# Patient Record
Sex: Female | Born: 1945 | Race: White | Hispanic: No | Smoking: Never smoker
Health system: Southern US, Community
[De-identification: ages and names within clinical notes are randomized; demographics above are authoritative.]

## PROBLEM LIST (undated history)

## (undated) DIAGNOSIS — Z9889 Other specified postprocedural states: Secondary | ICD-10-CM

## (undated) DIAGNOSIS — E119 Type 2 diabetes mellitus without complications: Secondary | ICD-10-CM

## (undated) DIAGNOSIS — H269 Unspecified cataract: Secondary | ICD-10-CM

## (undated) DIAGNOSIS — S82899A Other fracture of unspecified lower leg, initial encounter for closed fracture: Secondary | ICD-10-CM

## (undated) DIAGNOSIS — M1990 Unspecified osteoarthritis, unspecified site: Secondary | ICD-10-CM

## (undated) DIAGNOSIS — T7840XA Allergy, unspecified, initial encounter: Secondary | ICD-10-CM

## (undated) DIAGNOSIS — I10 Essential (primary) hypertension: Secondary | ICD-10-CM

## (undated) DIAGNOSIS — N184 Chronic kidney disease, stage 4 (severe): Secondary | ICD-10-CM

## (undated) DIAGNOSIS — H3530 Unspecified macular degeneration: Secondary | ICD-10-CM

## (undated) HISTORY — DX: Unspecified osteoarthritis, unspecified site: M19.90

## (undated) HISTORY — PX: EYE SURGERY: SHX253

## (undated) HISTORY — DX: Allergy, unspecified, initial encounter: T78.40XA

## (undated) HISTORY — DX: Type 2 diabetes mellitus without complications: E11.9

## (undated) HISTORY — DX: Other specified postprocedural states: Z98.890

## (undated) HISTORY — DX: Other fracture of unspecified lower leg, initial encounter for closed fracture: S82.899A

## (undated) HISTORY — DX: Unspecified cataract: H26.9

---

## 2013-04-13 ENCOUNTER — Encounter: Payer: Self-pay | Admitting: Podiatrist

## 2013-04-13 ENCOUNTER — Ambulatory Visit (INDEPENDENT_AMBULATORY_CARE_PROVIDER_SITE_OTHER): Payer: Medicare Other | Admitting: Podiatrist

## 2013-04-13 VITALS — BP 124/96 | HR 98 | Resp 16 | Ht 62.0 in | Wt 151.0 lb

## 2013-04-13 DIAGNOSIS — L97509 Non-pressure chronic ulcer of other part of unspecified foot with unspecified severity: Secondary | ICD-10-CM

## 2013-04-13 NOTE — Progress Notes (Addendum)
Subjective:  Patient presents today for continued care of ulceration of left foot.  Patient denies any new complaints.  Denies nausea, vomiting, fevers or chills.  States ulceration feels and appears much better- she is hoping it is healed.  Objective:  Ulceration located submetatarsal 1 of Left foot.  It has fragile, friable skin but no breakdown of integument noted. No redness, streaking or lymphingitis noted.  Prominent plantarflexed 1st metatarsal is noted with hyperkeratotic pre-ulcerative tissue noted.  Neuropathy present with pedal pulese intact  Assessment:  Ulceration Left foot submet 1  Plan: Discussed etiology, pathology, conservative vs. Surgical therapies and at this time office debridement was recommended  Hyperkeratotic tissue was debrided and intact integument noted.  Patient given instructions on aftercare.  Will return in 4 week intervals for preventative care of hpk  Trudie Buckler, DPM

## 2013-04-13 NOTE — Patient Instructions (Signed)
Your ulcer is almost healed!  Continue to watch the area and pad it until the skin becomes less fragile.  You no longer need to apply any antibiotic creams unless you see redness, swelling, pus or drainage.  If you do notice any of these symptoms, please call to be seen!

## 2013-05-25 ENCOUNTER — Encounter: Payer: Self-pay | Admitting: Podiatrist

## 2013-05-25 ENCOUNTER — Ambulatory Visit (INDEPENDENT_AMBULATORY_CARE_PROVIDER_SITE_OTHER): Payer: Medicare Other | Admitting: Podiatrist

## 2013-05-25 ENCOUNTER — Encounter (INDEPENDENT_AMBULATORY_CARE_PROVIDER_SITE_OTHER): Payer: Self-pay

## 2013-05-25 VITALS — BP 134/89 | HR 100 | Resp 18

## 2013-05-25 DIAGNOSIS — M79609 Pain in unspecified limb: Secondary | ICD-10-CM

## 2013-05-25 DIAGNOSIS — L97509 Non-pressure chronic ulcer of other part of unspecified foot with unspecified severity: Secondary | ICD-10-CM

## 2013-05-25 DIAGNOSIS — B351 Tinea unguium: Secondary | ICD-10-CM

## 2013-05-25 MED ORDER — CEPHALEXIN 500 MG PO CAPS: 500.0000 mg | ORAL_CAPSULE | Freq: Three times a day (TID) | ORAL | Status: DC

## 2013-05-25 NOTE — Progress Notes (Signed)
  Subjective: Patient presents today for continued care of ulceration of submetatarsal 1 left foot. Patient denies any new complaints. Denies nausea, vomiting, fevers or chills. She is getting new shoes today as she noticed the skin is thick again where the ulceration was present.  Objective: Ulceration located submetatarsal 1 of the Leftt foot. Poro keratotic tissue overlying and a largecallus present. No pus, streaking or lymphingitis noted however there is some redness around the ulceration itself..  Neuropathy present with pedal pulese intact.   toenails are also elongated thickened and patient complains of pain Assessment: Ulceration left foot submet 1,  painful symptomatic toenails Plan:  Discussed etiology, pathology, conservative vs. Surgical therapies and at this time office debridement was recommended Hyperkeratotic tissue was debrided and ulceration was again noted. Toenails were also debrided without complications. Patient given instructions on aftercare. Will return in 2 weeks for ulcer check. Also called in Keflex for her to start taking today as well. Trudie Buckler, DPM

## 2013-05-25 NOTE — Patient Instructions (Signed)
Instructions for Wound Care  The most important step to healing a foot wound is to reduce the pressure on your foot - it is extremely important to stay off your foot as much as possible   Cleanse your foot with saline wash or warm soapy water (dial antibacterial soap or similar).  Blot dry.  Apply prescribed medication to your wound and cover with gauze and a bandage.  May hold bandage in place with Coban (self sticky wrap), Ace bandage or tape.  You may find dressing supplies at your local [employer], [employer], drug store or medical supply store.  Your prescribed topical medication is :  Silvadene Cream (twice daily)   If you notice any foul odor, increase in pain, pus, increased swelling, red streaks or generalized redness occurring in your foot or leg-Call our office immediately to be seen.  This may be a sign of a limb or life threatening infection that will need prompt attention.  Trudie Buckler, [place]

## 2013-06-08 ENCOUNTER — Ambulatory Visit (INDEPENDENT_AMBULATORY_CARE_PROVIDER_SITE_OTHER): Payer: Medicare Other | Admitting: Podiatrist

## 2013-06-08 ENCOUNTER — Encounter: Payer: Self-pay | Admitting: Podiatrist

## 2013-06-08 DIAGNOSIS — L97509 Non-pressure chronic ulcer of other part of unspecified foot with unspecified severity: Secondary | ICD-10-CM

## 2013-06-08 NOTE — Progress Notes (Signed)
Subjective: Patient presents today for continued care of ulceration of submetatarsal 1 left foot. She states it "Feels much better on my left foot and I have been soaking it in epsom salt and went to a salt cave in [place] this past weekend".  She finished her Keflex antibiotics as prescribed.   Objective: Ulceration located submetatarsal 1 of the Leftt foot evaluated.  Hyperkeratotic tissue is present with a friable intact integument present beneath.  No pus, streaking or lymphingitis noted  Neuropathy present with pedal pulses intact.  Assessment: Ulceration left foot submet 1 in the presence of Neuropathy  Plan:  Discussed etiology, pathology, conservative vs. Surgical therapies and at this time office debridement was recommended Hyperkeratotic tissue was debrided.  Patient given instructions on aftercare. Will return in 4 weeks for recheck. If she notices any redness, swelling, or drainage, or any signs or symptoms of infection she is to call  Trudie Buckler, DPM

## 2013-06-08 NOTE — Patient Instructions (Signed)
Soak in epsom salts for 1 more week.  Keep a dressing on your foot for about 1-2 more weeks as well.  Call if you notice any redness, swelling, or drainage.

## 2013-07-20 ENCOUNTER — Encounter: Payer: Self-pay | Admitting: Podiatrist

## 2013-07-20 ENCOUNTER — Ambulatory Visit (INDEPENDENT_AMBULATORY_CARE_PROVIDER_SITE_OTHER): Payer: Medicare Other | Admitting: Podiatrist

## 2013-07-20 VITALS — BP 162/93 | HR 81 | Resp 17 | Ht 61.0 in | Wt 145.0 lb

## 2013-07-20 DIAGNOSIS — L97509 Non-pressure chronic ulcer of other part of unspecified foot with unspecified severity: Secondary | ICD-10-CM

## 2013-07-20 NOTE — Progress Notes (Signed)
Pt states she had the shingles over the Christmas holidays, but the doctor told me I'm not contagious.  Subjective: Patient presents today for continued care of ulceration of submetatarsal 1 left foot. She states it "Feels much better on my left foot" her nails are also long and uncomfortable Objective: Ulceration located submetatarsal 1 of the Leftt foot evaluated. Hyperkeratotic tissue is present with a friable intact integument present beneath. No pus, streaking or lymphingitis noted.  Toenails are elongated and uncomfortable as well Neuropathy present with pedal pulses intact.  Assessment: Ulceration left foot submet 1 in the presence of Neuropathy , onychomycosis Plan:  Discussed etiology, pathology, conservative vs. Surgical therapies and at this time office debridement was recommended Hyperkeratotic tissue was debrided. Patient given instructions on aftercare. Will return in 4 weeks for recheck. If she notices any redness, swelling, or drainage, or any signs or symptoms of infection she is to call . Nail debridement also accomplished today withouth complication Trudie Buckler, DPM

## 2013-08-11 ENCOUNTER — Telehealth: Payer: Self-pay | Admitting: *Deleted

## 2013-08-13 ENCOUNTER — Other Ambulatory Visit: Payer: Self-pay | Admitting: Podiatrist

## 2013-08-13 MED ORDER — CEPHALEXIN 500 MG PO CAPS: 500.0000 mg | ORAL_CAPSULE | Freq: Four times a day (QID) | ORAL | Status: DC

## 2013-08-13 NOTE — Telephone Encounter (Signed)
DR Valentina Lucks STATED TODAY THAT SHE HAD TAKEN CARE OF THIS 3 DAYS AGO/LC

## 2013-08-17 ENCOUNTER — Encounter: Payer: Self-pay | Admitting: Podiatrist

## 2013-08-17 ENCOUNTER — Ambulatory Visit (INDEPENDENT_AMBULATORY_CARE_PROVIDER_SITE_OTHER): Payer: Medicare Other | Admitting: Podiatrist

## 2013-08-17 VITALS — BP 157/92 | HR 93 | Resp 18

## 2013-08-17 DIAGNOSIS — L97509 Non-pressure chronic ulcer of other part of unspecified foot with unspecified severity: Secondary | ICD-10-CM

## 2013-08-17 DIAGNOSIS — L600 Ingrowing nail: Secondary | ICD-10-CM

## 2013-08-17 MED ORDER — CEPHALEXIN 500 MG PO CAPS: 500.0000 mg | ORAL_CAPSULE | Freq: Four times a day (QID) | ORAL | Status: DC

## 2013-08-17 NOTE — Progress Notes (Signed)
Subjective:  [name] presents for follow up of callus that has a tendency to ulcerate on the bottom of her left foot.  She states "The callus on ball of the left foot needs to be trimmed and left big toenail had a piece of skin and I pulled it and it is red and draining and I used coconut oil and been going on for about a week"  She states the left big toenail is red and swollen and she is concerned over infection.  Objective:  Vascular exam reveals pedal pulses intact at 2/4 dp and 1/4 pt bilateral.  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  Her callus appears friable and once debrided reveals macerated tissue beneath the callus and a small ulceration measuring 96mm in diameter.  No probing to bone is present.  The hallux on the left foot is red where she pulled a piece of toenail or skin off and caused the toe to be ingrown.  It does not have an ingrown spicule present at this time.  No streaking, no lymphangitis is present however cellulitus within the toe appears present  Assessment:  Ulcer submet 1 left foot- chronic ,  Paronychia left hallux  Plan: Discussed etiology, pathology, conservative vs. Surgical therapies and at this time office debridement was recommended  Ulcer was debrided and reactive hyperkeratoses and necrotic tissue was resected to the level of bleeding or viable tissue. No deep abscess, no erythema, no edema, no cellulitis, no odor was encountered.  Antibiotic ointment and a sterile dressing was applied.  Patient was given instructions on offloading and dressing change/aftercare and was instructed to call immediately if any signs or symptoms of infection arise.  She will be seen back in 1 week for recheck.  iodosorb will be ordered for the patient through [facility].  Keflex was prescribed for her to take immediately.  She states she has another presecrption that she never filled for the keflex and she will take this instead.

## 2013-09-21 ENCOUNTER — Encounter: Payer: Self-pay | Admitting: Podiatrist

## 2013-09-21 ENCOUNTER — Ambulatory Visit (INDEPENDENT_AMBULATORY_CARE_PROVIDER_SITE_OTHER): Payer: Medicare Other | Admitting: Podiatrist

## 2013-09-21 VITALS — BP 162/88 | HR 88 | Resp 18

## 2013-09-21 DIAGNOSIS — B351 Tinea unguium: Secondary | ICD-10-CM

## 2013-09-21 DIAGNOSIS — L97509 Non-pressure chronic ulcer of other part of unspecified foot with unspecified severity: Secondary | ICD-10-CM

## 2013-09-21 DIAGNOSIS — M79609 Pain in unspecified limb: Secondary | ICD-10-CM

## 2013-09-21 NOTE — Progress Notes (Signed)
   Subjective: [name] presents for follow up of callus that has a tendency to ulcerate on the bottom of her left foot. She states "I have this place on the ball of my left foot and trim my nails".  She relates the callus/ulcer has been present for over 6 months.  We have tried various topical antibiotic therapies, offloading, and the area continues to ulcerate.   Objective: Vascular exam reveals pedal pulses intact at 2/4 dp and 1/4 pt bilateral. Neurological sensation decreased with peripheral neuropathy present bilateral feet. Her callus appears friable and once debrided reveals macerated tissue beneath the callus and a small fissure ulceration measuring 30mm x 50mm . No probing to bone is present. As or signs of infection are present. Patient's toenails are elongated, thickened, discolored, dystrophic, clinically mycotic and painful. Left hallux where an ingrown toenail was performed has hyperkeratotic tissue overlying  Assessment: Ulcer submet 1 left foot- chronic , symptomatic onychomycosis   Plan:  Discussed etiology, pathology, conservative vs. Surgical therapies and at this time office debridement was recommended Ulcer was debrided and reactive hyperkeratoses and necrotic tissue was resected to the level of bleeding or viable tissue. No deep abscess, no erythema, no edema, no cellulitis, no odor was encountered. Antibiotic ointment and a sterile dressing was applied. Patient was given instructions on offloading and dressing change/aftercare and was instructed to call immediately if any signs or symptoms of infection arise. Discussed sending her to the wound center in [facility] for further treatment of the ulcer and possible skin graft.  She will consider the therapy and let me know if she would like to proceed. Debrided the toenails without complication at today's visit as well.

## 2013-10-19 ENCOUNTER — Encounter: Payer: Self-pay | Admitting: Podiatrist

## 2013-10-19 ENCOUNTER — Ambulatory Visit (INDEPENDENT_AMBULATORY_CARE_PROVIDER_SITE_OTHER): Payer: Medicare Other | Admitting: Podiatrist

## 2013-10-19 VITALS — BP 135/77 | HR 91 | Resp 18

## 2013-10-19 DIAGNOSIS — L97509 Non-pressure chronic ulcer of other part of unspecified foot with unspecified severity: Secondary | ICD-10-CM

## 2013-10-19 NOTE — Progress Notes (Signed)
I think it is better on my left foot  Subjective: Doreene presents for follow up of callus that has a tendency to ulcerate on the bottom of her left foot. She states "I think it is better- I have been using the iodosorb". She relates the callus/ulcer has been present for over 6 months. The iodosorb seems to be helping with the ulceration  Objective: Vascular exam reveals pedal pulses intact at 2/4 dp and 1/4 pt bilateral. Neurological sensation decreased with peripheral neuropathy present bilateral feet. Her callus appears friable and once debrided reveals  Continued macerated tissue beneath the callus and a small fissure ulceration measuring 43mm x 17mm . No probing to bone is present. No signs of infection are present.   Assessment: Ulcer submet 1 left foot- chronic   Plan:  Discussed etiology, pathology, conservative vs. Surgical therapies and at this time office debridement was recommended Ulcer was debrided and reactive hyperkeratoses and necrotic tissue was resected to the level of bleeding or viable tissue. No deep abscess, no erythema, no edema, no cellulitis, no odor was encountered. Iodosorb and a sterile dressing was applied. Patient was given instructions on offloading and dressing change/aftercare with Iodosorb and was instructed to call immediately if any signs or symptoms of infection arise. IF not improved next visit will consider a [place]askin Graft.

## 2013-11-16 ENCOUNTER — Ambulatory Visit (INDEPENDENT_AMBULATORY_CARE_PROVIDER_SITE_OTHER): Payer: Medicare Other | Admitting: Podiatrist

## 2013-11-16 ENCOUNTER — Encounter: Payer: Self-pay | Admitting: Podiatrist

## 2013-11-16 VITALS — BP 178/89 | HR 85 | Temp 97.1°F | Resp 18

## 2013-11-16 DIAGNOSIS — L97509 Non-pressure chronic ulcer of other part of unspecified foot with unspecified severity: Secondary | ICD-10-CM

## 2013-11-16 DIAGNOSIS — M79609 Pain in unspecified limb: Secondary | ICD-10-CM

## 2013-11-16 DIAGNOSIS — B351 Tinea unguium: Secondary | ICD-10-CM

## 2013-11-16 NOTE — Progress Notes (Signed)
   Subjective:    Patient ID: [name], female    DOB: February 22, 1946, 68 y.o.   MRN: [id number]  HPI" I have a new spot on the ball of the left foot and I went and got a new pair of shoes in April and wore them about 4 times and that is how the spot got there and used neosporin and I need a trim"  [name] presents for follow up of chronic ulcer submet 1 of the left foot.  She has a new lesion submet 5 due to a new pair of shoes she wore in April.  She denies any systemic or local signs of infection.  She denies pain.  She does have neuropathy bilaterally.   Review of Systems     Objective:   Physical Exam Objective: Vascular exam reveals pedal pulses intact at 2/4 dp and 1/4 pt bilateral. Neurological sensation decreased with peripheral neuropathy present bilateral feet. submet 1 left reveals Continued macerated tissue beneath the callus and a small ulceration measuring  43mm x 51mm . No probing to bone is present. No signs of infection are present.  Hemorrhagic tissue present submet 5 as well.  Measuring 11mm x 108mm x 67mm.  No signs of infection noted.  All nails are thick, discolored, elongated and mycotic.     Assessment & Plan:  Ulcer 1 and 5-- mycotic toenails  Plan:  Debrided the mycotic toensils,  Debrided the ulcerations, applied iodosorb and a dressing.  Gave instructions for home care of the wounds.  Recommended referral to the wound center for further therapy.  We will send out a referral to the [place] [facility].

## 2013-11-23 ENCOUNTER — Ambulatory Visit: Payer: Medicare Other | Admitting: Podiatrist

## 2013-11-25 ENCOUNTER — Encounter: Payer: Self-pay | Admitting: Surgery

## 2013-11-25 ENCOUNTER — Ambulatory Visit: Payer: Self-pay | Admitting: Surgery

## 2013-11-25 LAB — HEMOGLOBIN A1C: HEMOGLOBIN A1C: 10.3 % — AB (ref 4.2–6.3)

## 2013-12-06 ENCOUNTER — Encounter: Payer: Self-pay | Admitting: Surgery

## 2014-02-15 ENCOUNTER — Ambulatory Visit: Payer: Medicare Other | Admitting: Podiatrist

## 2014-10-27 ENCOUNTER — Ambulatory Visit: Payer: BC Managed Care – PPO | Admitting: Podiatrist

## 2014-10-28 ENCOUNTER — Ambulatory Visit: Payer: BC Managed Care – PPO

## 2015-07-09 HISTORY — PX: OTHER SURGICAL HISTORY: SHX169

## 2016-04-15 ENCOUNTER — Ambulatory Visit (INDEPENDENT_AMBULATORY_CARE_PROVIDER_SITE_OTHER): Payer: Medicare Other | Admitting: Podiatry

## 2016-04-15 ENCOUNTER — Encounter: Payer: Self-pay | Admitting: Podiatry

## 2016-04-15 VITALS — BP 188/94 | HR 89 | Resp 14

## 2016-04-15 DIAGNOSIS — L03119 Cellulitis of unspecified part of limb: Secondary | ICD-10-CM

## 2016-04-15 DIAGNOSIS — L02619 Cutaneous abscess of unspecified foot: Secondary | ICD-10-CM | POA: Diagnosis not present

## 2016-04-15 DIAGNOSIS — E08621 Diabetes mellitus due to underlying condition with foot ulcer: Secondary | ICD-10-CM

## 2016-04-15 DIAGNOSIS — L97521 Non-pressure chronic ulcer of other part of left foot limited to breakdown of skin: Principal | ICD-10-CM

## 2016-04-15 DIAGNOSIS — L89891 Pressure ulcer of other site, stage 1: Secondary | ICD-10-CM

## 2016-04-15 MED ORDER — CEPHALEXIN 500 MG PO CAPS: 500.0000 mg | ORAL_CAPSULE | Freq: Two times a day (BID) | ORAL | 0 refills | Status: DC

## 2016-04-15 NOTE — Progress Notes (Signed)
This patient presents to the office with chief complaint of a developing ulcer under the ball of her left foot. She states that it is not painful and there is been no drainage or bleeding noted from the ulcer. She says isn't present for 3 months and she has been soaking it in Listerine and vinegar at home. She presents the office today stating that she has this ulcer and she is diabetic. She know she  needs to have this wound closed. She presents the office for an evaluation and treatment of this condition. She has a history of a ulcer, sub-1 of the left foot treated by Dr. Valentina Lucks 2 years ago   GENERAL APPEARANCE: Alert, conversant. Appropriately groomed. No acute distress.  VASCULAR: Pedal pulses are  palpable at  [facility] and PT bilateral.  Capillary refill time is immediate to all digits,  Normal temperature gradient.   NEUROLOGIC: sensation is normal to 5.07 monofilament at 5/5 sites bilateral.  Light touch is intact bilateral, Muscle strength normal.  MUSCULOSKELETAL: acceptable muscle strength, tone and stability bilateral.  Intrinsic muscluature intact bilateral.  Rectus appearance of foot and digits noted bilateral.   DERMATOLOGIC: She has an ulcer, sub-first MPJ of the left foot, which measures 10 mm x 10 mm no evidence of any pus or drainage from the ulcer. The ulcer is surrounded by a circle  of callus. No drainage noted. No malodor is noted. The beginnings of redness is noted around the ulcer on the left foot   Diabetic ulcer with possible infection.  IE  debridement of the necrotic tissue, sub-first MPJ left foot. Silvadene dry sterile dressing was applied. Home instructions were given for soaks cephalexin was prescribed for this patient. She is to return the office in one week for further evaluation and treatment   Gardiner Barefoot DPM

## 2016-04-22 ENCOUNTER — Ambulatory Visit (INDEPENDENT_AMBULATORY_CARE_PROVIDER_SITE_OTHER): Payer: Medicare Other | Admitting: Podiatry

## 2016-04-22 ENCOUNTER — Encounter: Payer: Self-pay | Admitting: Podiatry

## 2016-04-22 VITALS — BP 179/90 | HR 83 | Resp 14

## 2016-04-22 DIAGNOSIS — L02619 Cutaneous abscess of unspecified foot: Secondary | ICD-10-CM

## 2016-04-22 DIAGNOSIS — L03119 Cellulitis of unspecified part of limb: Secondary | ICD-10-CM

## 2016-04-22 DIAGNOSIS — L97521 Non-pressure chronic ulcer of other part of left foot limited to breakdown of skin: Principal | ICD-10-CM

## 2016-04-22 DIAGNOSIS — L89891 Pressure ulcer of other site, stage 1: Secondary | ICD-10-CM | POA: Diagnosis not present

## 2016-04-22 DIAGNOSIS — E08621 Diabetes mellitus due to underlying condition with foot ulcer: Secondary | ICD-10-CM

## 2016-04-22 NOTE — Progress Notes (Signed)
This patient presents to the office with chief complaint of a ulcer under the big toe joint, left foot. She was diagnosed with this ulcer last week and treated with home soaks and bandaging. He states that the ulcer looks much better today and she is very pleased with her progress. She presents the office today for continued evaluation and treatment of this condition   GENERAL APPEARANCE: Alert, conversant. Appropriately groomed. No acute distress.  VASCULAR: Pedal pulses are  palpable at  [facility] and PT bilateral.  Capillary refill time is immediate to all digits,  Normal temperature gradient.   NEUROLOGIC: sensation is normal to 5.07 monofilament at 5/5 sites bilateral.  Light touch is intact bilateral, Muscle strength normal.  MUSCULOSKELETAL: acceptable muscle strength, tone and stability bilateral.  Intrinsic muscluature intact bilateral.  Rectus appearance of foot and digits noted bilateral.   DERMATOLOGIC: She has an ulcer, sub-first MPJ of the left foot, which measures 6 mm x 10 mm no evidence of any pus or drainage from the ulcer. The ulcer is surrounded by a circle  of callus. No drainage noted. No malodor is noted. No redness is noted.   Diabetic ulcer  IE  debridement of the necrotic tissue, sub-first MPJ left foot. Silvadene dry sterile dressing was applied. To continue with home soaks.   She is to return the office in two weeks for further evaluation and treatment   Gardiner Barefoot DPM

## 2016-05-13 ENCOUNTER — Ambulatory Visit (INDEPENDENT_AMBULATORY_CARE_PROVIDER_SITE_OTHER): Payer: Medicare Other | Admitting: Podiatry

## 2016-05-13 ENCOUNTER — Ambulatory Visit: Payer: Self-pay | Admitting: Physical Therapy

## 2016-05-13 ENCOUNTER — Encounter: Payer: Self-pay | Admitting: Podiatry

## 2016-05-13 VITALS — BP 200/100 | HR 87 | Resp 14

## 2016-05-13 DIAGNOSIS — L97521 Non-pressure chronic ulcer of other part of left foot limited to breakdown of skin: Secondary | ICD-10-CM

## 2016-05-13 DIAGNOSIS — E08621 Diabetes mellitus due to underlying condition with foot ulcer: Secondary | ICD-10-CM

## 2016-05-13 MED ORDER — SILVER SULFADIAZINE 1 % EX CREA: TOPICAL_CREAM | Freq: Every day | CUTANEOUS | 0 refills | Status: AC

## 2016-05-13 MED ORDER — CEPHALEXIN 500 MG PO CAPS: 500.0000 mg | ORAL_CAPSULE | Freq: Two times a day (BID) | ORAL | 0 refills | Status: DC

## 2016-05-13 NOTE — Addendum Note (Signed)
Addended byDeidre Ala, Enrrique Mierzwa L on: 05/13/2016 02:37 PM   Modules accepted: Orders

## 2016-05-13 NOTE — Progress Notes (Signed)
This patient presents to the office with chief complaint of a ulcer under the big toe joint, left foot. She was diagnosed with this ulcer last week and treated with home soaks and bandaging. She  states that the ulcer looks much better today and she is very pleased with her progress. She presents the office today for continued evaluation and treatment of this condition   GENERAL APPEARANCE: Alert, conversant. Appropriately groomed. No acute distress.  VASCULAR: Pedal pulses are  palpable at  [facility] and PT bilateral.  Capillary refill time is immediate to all digits,  Normal temperature gradient.   NEUROLOGIC: sensation is normal to 5.07 monofilament at 5/5 sites bilateral.  Light touch is intact bilateral, Muscle strength normal.  MUSCULOSKELETAL: acceptable muscle strength, tone and stability bilateral.  Intrinsic muscluature intact bilateral.  Rectus appearance of foot and digits noted bilateral.   DERMATOLOGIC: She has an ulcer, sub-first MPJ of the left foot, which measures 14  mm x 10 mm no evidence of any pus or drainage from the ulcer. The ulcer is surrounded by a circle  of callus. No drainage noted. No malodor is noted. No redness is noted.   Diabetic ulcer  IE  debridement of the necrotic tissue, sub-first MPJ left foot. Silvadene dry sterile dressing was applied. To continue with home soaks.   She is to return the office in two weeks for further evaluation and treatment.  Prescribed antibiotics at this visit, as well as Silvadene cream.  Return to the clinic in 2 weeks  Gardiner Barefoot DPM

## 2016-05-27 ENCOUNTER — Ambulatory Visit (INDEPENDENT_AMBULATORY_CARE_PROVIDER_SITE_OTHER): Payer: Medicare Other | Admitting: Podiatry

## 2016-05-27 ENCOUNTER — Encounter: Payer: Self-pay | Admitting: Podiatry

## 2016-05-27 VITALS — BP 191/107 | HR 96 | Resp 14

## 2016-05-27 DIAGNOSIS — L97521 Non-pressure chronic ulcer of other part of left foot limited to breakdown of skin: Secondary | ICD-10-CM | POA: Diagnosis not present

## 2016-05-27 DIAGNOSIS — E08621 Diabetes mellitus due to underlying condition with foot ulcer: Secondary | ICD-10-CM

## 2016-05-27 NOTE — Progress Notes (Signed)
This patient presents to the office with chief complaint of a ulcer under the big toe joint, left foot. She was diagnosed with this ulcer last week and treated with home soaks and bandaging. She  states that the ulcer looks much better today  She says there is little drainage but the ulcer does not appear to be closing.. She presents the office today for continued evaluation and treatment of this condition   GENERAL APPEARANCE: Alert, conversant. Appropriately groomed. No acute distress.  VASCULAR: Pedal pulses are  palpable at  [facility] and PT bilateral.  Capillary refill time is immediate to all digits,  Normal temperature gradient.   NEUROLOGIC: sensation is normal to 5.07 monofilament at 5/5 sites bilateral.  Light touch is intact bilateral, Muscle strength normal.  MUSCULOSKELETAL: acceptable muscle strength, tone and stability bilateral.  Intrinsic muscluature intact bilateral.  Rectus appearance of foot and digits noted bilateral.   DERMATOLOGIC: She has an ulcer, sub-first MPJ of the left foot, which measures 14  mm x 10 mm no evidence of any pus or drainage from the ulcer. The ulcer is surrounded by a circle  of callus. No drainage noted. No malodor is noted. No redness is noted.   Diabetic ulcer  I debridement of the necrotic tissue, sub-first MPJ left foot. Silvadene dry sterile dressing was applied. To continue with home soaks.   She has requested to be treated at the wound Center. I divided her ulcer and agreed that we should send her to the wound Center. Her insulin. She says is under control, but minimal closure of the ulcer is noted  Gardiner Barefoot DPM

## 2016-05-28 ENCOUNTER — Telehealth: Payer: Self-pay | Admitting: *Deleted

## 2016-05-28 DIAGNOSIS — L97521 Non-pressure chronic ulcer of other part of left foot limited to breakdown of skin: Principal | ICD-10-CM

## 2016-05-28 DIAGNOSIS — E08621 Diabetes mellitus due to underlying condition with foot ulcer: Secondary | ICD-10-CM

## 2016-05-28 NOTE — Telephone Encounter (Addendum)
-----   Message from Lolita Rieger sent at 05/27/2016  6:06 PM EST ----- Dr. Prudence Davidson requests patient go to the [facility] for right foot ulcer.  Patient wants to go to [facility] for this. 05/28/2016-Faxed referral, pt demographics and clinicals to [place].

## 2016-06-05 ENCOUNTER — Other Ambulatory Visit: Payer: Self-pay | Admitting: Internal Medicine

## 2016-06-05 ENCOUNTER — Ambulatory Visit
Admission: RE | Admit: 2016-06-05 | Discharge: 2016-06-05 | Disposition: A | Discharge status: Home / Self Care | Payer: Medicare Other | Source: Ambulatory Visit | Attending: Internal Medicine | Admitting: Internal Medicine

## 2016-06-05 ENCOUNTER — Encounter: Payer: Medicare Other | Attending: Internal Medicine | Admitting: Internal Medicine

## 2016-06-05 DIAGNOSIS — S91302A Unspecified open wound, left foot, initial encounter: Secondary | ICD-10-CM | POA: Diagnosis present

## 2016-06-05 DIAGNOSIS — S81809A Unspecified open wound, unspecified lower leg, initial encounter: Secondary | ICD-10-CM

## 2016-06-05 DIAGNOSIS — Z885 Allergy status to narcotic agent status: Secondary | ICD-10-CM | POA: Diagnosis not present

## 2016-06-05 DIAGNOSIS — L97522 Non-pressure chronic ulcer of other part of left foot with fat layer exposed: Secondary | ICD-10-CM | POA: Insufficient documentation

## 2016-06-05 DIAGNOSIS — M7732 Calcaneal spur, left foot: Secondary | ICD-10-CM | POA: Insufficient documentation

## 2016-06-05 DIAGNOSIS — Z7984 Long term (current) use of oral hypoglycemic drugs: Secondary | ICD-10-CM | POA: Diagnosis not present

## 2016-06-05 DIAGNOSIS — E11621 Type 2 diabetes mellitus with foot ulcer: Secondary | ICD-10-CM | POA: Insufficient documentation

## 2016-06-05 DIAGNOSIS — I10 Essential (primary) hypertension: Secondary | ICD-10-CM | POA: Diagnosis not present

## 2016-06-05 DIAGNOSIS — E1140 Type 2 diabetes mellitus with diabetic neuropathy, unspecified: Secondary | ICD-10-CM | POA: Insufficient documentation

## 2016-06-05 DIAGNOSIS — E119 Type 2 diabetes mellitus without complications: Secondary | ICD-10-CM | POA: Diagnosis not present

## 2016-06-05 DIAGNOSIS — X58XXXA Exposure to other specified factors, initial encounter: Secondary | ICD-10-CM | POA: Insufficient documentation

## 2016-06-06 NOTE — Progress Notes (Signed)
CARINNE, [name] ([id number]) Visit Report for 06/05/2016 Abuse/Suicide Risk Screen Details Patient Name: [name], [name] Date of Service: 06/05/2016 9:45 AM Medical Record Patient Account Number: [id number] Number: Treating RN: Cornell Barman May 12, 1946 (70 y.o. Other Clinician: Date of Birth/Sex: Female) Treating ROBSON, MICHAEL Primary Care Physician/Extender: Estanislado Pandy, Cecille Rubin Physician: Referring Physician: Gardiner Barefoot Weeks in Treatment: 0 Abuse/Suicide Risk Screen Items Answer ABUSE/SUICIDE RISK SCREEN: Has anyone close to you tried to hurt or harm you recentlyo No Do you feel uncomfortable with anyone in your familyo No Has anyone forced you do things that you didnot want to doo No Do you have any thoughts of harming yourselfo No Patient displays signs or symptoms of abuse and/or neglect. No Electronic Signature(s) Signed: 06/05/2016 1:49:48 PM By: Gretta Cool RN, BSN, Kim RN, BSN Entered By: Gretta Cool, RN, BSN, Kim on 06/05/2016 10:33:01 [place], [name] ([id number]) -------------------------------------------------------------------------------- Activities of Daily Living Details Patient Name: [name], [name] Date of Service: 06/05/2016 9:45 AM Medical Record Patient Account Number: [id number] Number: Treating RN: Cornell Barman 10/18/1945 (70 y.o. Other Clinician: Date of Birth/Sex: Female) Treating ROBSON, MICHAEL Primary Care Physician/Extender: Estanislado Pandy, Cecille Rubin Physician: Referring Physician: Gardiner Barefoot Weeks in Treatment: 0 Activities of Daily Living Items Answer Activities of Daily Living (Please select one for each item) Drive Automobile Completely Able Take Medications Completely Able Use Telephone Completely Able Care for Appearance Completely Able Use Toilet Completely Able Bath / Shower Completely Able Dress Self Completely Able Feed Self Completely Able Walk Completely Able Get In / Out Bed Completely Able Housework Completely  Able Prepare Meals Completely Able Handle Money Completely Able Shop for Self Completely Able Electronic Signature(s) Signed: 06/05/2016 1:49:48 PM By: Gretta Cool, RN, BSN, Kim RN, BSN Entered By: Gretta Cool, RN, BSN, Kim on 06/05/2016 10:33:12 [place], [name] ([id number]) -------------------------------------------------------------------------------- Education Assessment Details Patient Name: [name], [name] Date of Service: 06/05/2016 9:45 AM Medical Record Patient Account Number: [id number] Number: Treating RN: Cornell Barman 1946-05-01 (70 y.o. Other Clinician: Date of Birth/Sex: Female) Treating ROBSON, MICHAEL Primary Care Physician/Extender: Estanislado Pandy, Cecille Rubin Physician: Referring Physician: Bayard Hugger in Treatment: 0 Primary Learner Assessed: Patient Learning Preferences/Education Level/Primary Language Learning Preference: Explanation Highest Education Level: College or Above Preferred Language: English Cognitive Barrier Assessment/Beliefs Language Barrier: No Translator Needed: No Memory Deficit: No Emotional Barrier: No Cultural/Religious Beliefs Affecting Medical No Care: Physical Barrier Assessment Impaired Vision: No Impaired Hearing: No Decreased Hand dexterity: No Knowledge/Comprehension Assessment Knowledge Level: High Comprehension Level: High Ability to understand written High instructions: Ability to understand verbal High instructions: Motivation Assessment Anxiety Level: Calm Cooperation: Cooperative Education Importance: Acknowledges Need Interest in Health Problems: Asks Questions Perception: Coherent Willingness to Engage in Self- High Management Activities: High [place], [place]. ([id number]) Readiness to Engage in Self- Management Activities: Electronic Signature(s) Signed: 06/05/2016 1:49:48 PM By: Gretta Cool, RN, BSN, Kim RN, BSN Entered By: Gretta Cool, RN, BSN, Kim on 06/05/2016 10:33:39 [place], [name]  ([id number]) -------------------------------------------------------------------------------- Fall Risk Assessment Details Patient Name: [name], [name] Date of Service: 06/05/2016 9:45 AM Medical Record Patient Account Number: [id number] Number: Treating RN: Cornell Barman 1945/12/28 (70 y.o. Other Clinician: Date of Birth/Sex: Female) Treating ROBSON, MICHAEL Primary Care Physician/Extender: Estanislado Pandy, Cecille Rubin Physician: Referring Physician: Gardiner Barefoot Weeks in Treatment: 0 Fall Risk Assessment Items Have you had 2 or more falls in the last 12 monthso 0 No Have you had any fall that resulted in injury in the last 12 monthso 0 No FALL RISK ASSESSMENT: History of falling -  immediate or within 3 months 0 No Secondary diagnosis 0 No Ambulatory aid None/bed rest/wheelchair/nurse 0 Yes Crutches/cane/walker 0 No Furniture 0 No IV Access/Saline Lock 0 No Gait/Training Normal/bed rest/immobile 0 Yes Weak 0 No Impaired 0 No Mental Status Oriented to own ability 0 Yes Electronic Signature(s) Signed: 06/05/2016 1:49:48 PM By: Gretta Cool, RN, BSN, Kim RN, BSN Entered By: Gretta Cool, RN, BSN, Kim on 06/05/2016 10:33:53 [place], [place] ([id number]) -------------------------------------------------------------------------------- Foot Assessment Details Patient Name: [name], [name] Date of Service: 06/05/2016 9:45 AM Medical Record Patient Account Number: [id number] Number: Treating RN: Cornell Barman [birth year] (70 y.o. Other Clinician: Date of Birth/Sex: Female) Treating ROBSON, MICHAEL Primary Care Physician/Extender: Estanislado Pandy, Cecille Rubin Physician: Referring Physician: Gardiner Barefoot Weeks in Treatment: 0 Foot Assessment Items Site Locations + = Sensation present, - = Sensation absent, C = Callus, U = Ulcer R = Redness, W = Warmth, M = Maceration, PU = Pre-ulcerative lesion F = Fissure, S = Swelling, D = Dryness Assessment Right: Left: Other Deformity: No No Prior Foot  Ulcer: No No Prior Amputation: No No Charcot Joint: No No Ambulatory Status: Ambulatory Without Help Gait: Steady Electronic Signature(s) Signed: 06/05/2016 1:49:48 PM By: Gretta Cool, RN, BSN, Kim RN, BSN Entered By: Gretta Cool, RN, BSN, Kim on 06/05/2016 10:35:44 [place], Judyann L. ([id number]) [place], [place] ([id number]) -------------------------------------------------------------------------------- Nutrition Risk Assessment Details Patient Name: Magan, Ghazal L. Date of Service: 06/05/2016 9:45 AM Medical Record Patient Account Number: [id number] Number: Treating RN: Cornell Barman Apr 23, 1946 (70 y.o. Other Clinician: Date of Birth/Sex: Female) Treating ROBSON, MICHAEL Primary Care Physician/Extender: Estanislado Pandy, Cecille Rubin Physician: Referring Physician: Gardiner Barefoot Weeks in Treatment: 0 Height (in): 62 Weight (lbs): 140 Body Mass Index (BMI): 25.6 Nutrition Risk Assessment Items NUTRITION RISK SCREEN: I have an illness or condition that made me change the kind and/or 0 No amount of food I eat I eat fewer than two meals per day 0 No I eat few fruits and vegetables, or milk products 0 No I have three or more drinks of beer, liquor or wine almost every day 0 No I have tooth or mouth problems that make it hard for me to eat 0 No I don't always have enough money to buy the food I need 0 No I eat alone most of the time 0 No I take three or more different prescribed or over-the-counter drugs a 0 No day Without wanting to, I have lost or gained 10 pounds in the last six 0 No months I am not always physically able to shop, cook and/or feed myself 0 No Nutrition Protocols Good Risk Protocol Provide education on elevated blood sugars Moderate Risk Protocol 0 and impact on wound healing, as applicable Electronic Signature(s) Signed: 06/05/2016 1:49:48 PM By: Gretta Cool, RN, BSN, Kim RN, BSN Entered By: Gretta Cool, RN, BSN, Kim on 06/05/2016 10:34:16

## 2016-06-06 NOTE — Progress Notes (Signed)
CHENEE, MUNNS ([id number]) Visit Report for 06/05/2016 Allergy List Details Patient Name: [name], [name] Date of Service: 06/05/2016 9:45 AM Medical Record Patient Account Number: [id number] Number: Treating RN: Cornell Barman [birth year] (70 y.o. Other Clinician: Date of Birth/Sex: Female) Treating ROBSON, MICHAEL Primary Care Physician/Extender: Estanislado Pandy, Cecille Rubin Physician: Referring Physician: Gardiner Barefoot Weeks in Treatment: 0 Allergies Active Allergies codeine seasonal allergies Allergy Notes Electronic Signature(s) Signed: 06/05/2016 1:49:48 PM By: Gretta Cool, RN, BSN, Kim RN, BSN Entered By: Gretta Cool, RN, BSN, Kim on 06/05/2016 10:28:37 [name] ([id number]) -------------------------------------------------------------------------------- Arrival Information Details Patient Name: [name], [name] Date of Service: 06/05/2016 9:45 AM Medical Record Patient Account Number: [id number] Number: Treating RN: Cornell Barman Nov 29, 1945 (70 y.o. Other Clinician: Date of Birth/Sex: Female) Treating ROBSON, MICHAEL Primary Care Physician/Extender: Estanislado Pandy, Cecille Rubin Physician: Referring Physician: Bayard Hugger in Treatment: 0 Visit Information Patient Arrived: Ambulatory Arrival Time: 09:57 Accompanied By: daughter Transfer Assistance: None Patient Identification Verified: Yes Secondary Verification Process Yes Completed: History Since Last Visit Hospitalized since last visit: No Pain Present Now: No Electronic Signature(s) Signed: 06/05/2016 1:49:48 PM By: Gretta Cool, RN, BSN, Kim RN, BSN Entered By: Gretta Cool, RN, BSN, Kim on 06/05/2016 09:58:19 [name] ([id number]) -------------------------------------------------------------------------------- Clinic Level of Care Assessment Details Patient Name: [name], [name] Date of Service: 06/05/2016 9:45 AM Medical Record Patient Account Number: [id number] Number: Treating RN: Cornell Barman [birth year] (70 y.o. Other Clinician: Date of Birth/Sex: Female) Treating ROBSON, MICHAEL Primary Care Physician/Extender: Estanislado Pandy, Cecille Rubin Physician: Referring Physician: Gardiner Barefoot Weeks in Treatment: 0 Clinic Level of Care Assessment Items TOOL 1 Quantity Score []  - Use when EandM and Procedure is performed on INITIAL visit 0 ASSESSMENTS - Nursing Assessment / Reassessment X - General Physical Exam (combine w/ comprehensive assessment (listed just 1 20 below) when performed on new pt. evals) X - Comprehensive Assessment (HX, ROS, Risk Assessments, Wounds Hx, etc.) 1 25 ASSESSMENTS - Wound and Skin Assessment / Reassessment []  - Dermatologic / Skin Assessment (not related to wound area) 0 ASSESSMENTS - Ostomy and/or Continence Assessment and Care []  - Incontinence Assessment and Management 0 []  - Ostomy Care Assessment and Management (repouching, etc.) 0 PROCESS - Coordination of Care X - Simple Patient / Family Education for ongoing care 1 15 []  - Complex (extensive) Patient / Family Education for ongoing care 0 X - Staff obtains Programmer, systems, Records, Test Results / Process Orders 1 10 []  - Staff telephones HHA, Nursing Homes / Clarify orders / etc 0 []  - Routine Transfer to another Facility (non-emergent condition) 0 []  - Routine Hospital Admission (non-emergent condition) 0 []  - New Admissions / Biomedical engineer / Ordering NPWT, Apligraf, etc. 0 []  - Emergency Hospital Admission (emergent condition) 0 PROCESS - Special Needs []  - Pediatric / Minor Patient Management 0 [name], [name] ([id number]) []  - Isolation Patient Management 0 []  - Hearing / Language / Visual special needs 0 []  - Assessment of Community assistance (transportation, D/C planning, etc.) 0 []  - Additional assistance / Altered mentation 0 []  - Support Surface(s) Assessment (bed, cushion, seat, etc.) 0 INTERVENTIONS - Miscellaneous []  - External ear exam 0 []  - Patient Transfer (multiple staff /  Civil Service fast streamer / Similar devices) 0 []  - Simple Staple / Suture removal (25 or less) 0 []  - Complex Staple / Suture removal (26 or more) 0 []  - Hypo/Hyperglycemic Management (do not check if billed separately) 0 X - Ankle / Brachial Index (ABI) - do not check if billed  separately 1 15 Has the patient been seen at the hospital within the last three years: Yes Total Score: 85 Level Of Care: New/Established - Level 3 Electronic Signature(s) Signed: 06/05/2016 1:49:48 PM By: Gretta Cool, RN, BSN, Kim RN, BSN Entered By: Gretta Cool, RN, BSN, Kim on 06/05/2016 11:32:54 [name], [name] ([id number]) -------------------------------------------------------------------------------- Encounter Discharge Information Details Patient Name: [name], [name] Date of Service: 06/05/2016 9:45 AM Medical Record Patient Account Number: [id number] Number: Treating RN: Ahmed Prima 1945/10/20 (70 y.o. Other Clinician: Date of Birth/Sex: Female) Treating ROBSON, MICHAEL Primary Care Physician/Extender: Estanislado Pandy, Cecille Rubin Physician: Referring Physician: Bayard Hugger in Treatment: 0 Encounter Discharge Information Items Discharge Pain Level: 0 Discharge Condition: Stable Ambulatory Status: Ambulatory Discharge Destination: Home Transportation: Private Auto Accompanied By: daughter Schedule Follow-up Appointment: Yes Medication Reconciliation completed and provided to Patient/Care Yes [name]: Provided on Clinical Summary of Care: 06/05/2016 Form Type Recipient Paper Patient BR Electronic Signature(s) Signed: 06/05/2016 11:34:20 AM By: Gretta Cool RN, BSN, Kim RN, BSN Previous Signature: 06/05/2016 11:23:00 AM Version By: Ruthine Dose Entered By: Gretta Cool RN, BSN, Kim on 06/05/2016 11:34:20 [name], [name] ([id number]) -------------------------------------------------------------------------------- Lower Extremity Assessment Details Patient Name: [name], [name] Date of Service: 06/05/2016  9:45 AM Medical Record Patient Account Number: [id number] Number: Treating RN: Cornell Barman 20-Aug-1945 (70 y.o. Other Clinician: Date of Birth/Sex: Female) Treating ROBSON, MICHAEL Primary Care Physician/Extender: Estanislado Pandy, Cecille Rubin Physician: Referring Physician: Gardiner Barefoot Weeks in Treatment: 0 Edema Assessment Assessed: [Left: Yes] [Right: Yes] Edema: [Left: No] [Right: No] Vascular Assessment Pulses: Posterior Tibial Palpable: [Left:Yes] [Right:Yes] Doppler: [Left:Multiphasic] [Right:Multiphasic] Dorsalis Pedis Palpable: [Left:Yes] [Right:Yes] Doppler: [Left:Multiphasic] [Right:Monophasic] Extremity colors, hair growth, and conditions: Extremity Color: [Left:Normal] [Right:Normal] Hair Growth on Extremity: [Left:Yes] [Right:Yes] Temperature of Extremity: [Left:Warm] [Right:Warm] Capillary Refill: [Left:< 3 seconds] [Right:< 3 seconds] Dependent Rubor: [Left:No] [Right:No] Blanched when Elevated: [Left:No] [Right:No] Lipodermatosclerosis: [Left:No] [Right:No] Blood Pressure: Brachial: [Left:190] [Right:227] Dorsalis Pedis: 180 [Left:Dorsalis Pedis: 200] Ankle: Posterior Tibial: 200 [Left:Posterior Tibial: 170 0.88] [Right:0.88] Toe Nail Assessment Left: Right: Thick: No No Discolored: No No Deformed: No No Improper Length and Hygiene: No No JOVONDA, [name] ([id number]) Electronic Signature(s) Signed: 06/05/2016 1:49:48 PM By: Gretta Cool, RN, BSN, Kim RN, BSN Entered By: Gretta Cool, RN, BSN, Kim on 06/05/2016 10:20:53 [name], [name] ([id number]) -------------------------------------------------------------------------------- Multi Wound Chart Details Patient Name: [name], [name] Date of Service: 06/05/2016 9:45 AM Medical Record Patient Account Number: [id number] Number: Treating RN: Cornell Barman 1945-08-31 (70 y.o. Other Clinician: Date of Birth/Sex: Female) Treating ROBSON, MICHAEL Primary Care Physician/Extender: Estanislado Pandy,  Cecille Rubin Physician: Referring Physician: Gardiner Barefoot Weeks in Treatment: 0 Vital Signs Height(in): 62 Pulse(bpm): 94 Weight(lbs): 140 Blood Pressure 227/72 (mmHg): Body Mass Index(BMI): 26 Temperature(F): 97.8 Respiratory Rate 16 (breaths/min): Photos: [2:No Photos] [3:No Photos] [N/A:N/A] Wound Location: [2:Left Foot - Plantar] [3:Left Foot - Lateral] [N/A:N/A] Wounding Event: [2:Gradually Appeared] [3:Blister] [N/A:N/A] Primary Etiology: [2:Diabetic Wound/Ulcer of the Lower Extremity] [3:Pressure Ulcer] [N/A:N/A] Secondary Etiology: [2:Pressure Ulcer] [3:Diabetic Wound/Ulcer of the Lower Extremity] [N/A:N/A] Comorbid History: [2:Hypertension, Type II Diabetes] [3:Hypertension, Type II Diabetes] [N/A:N/A] Date Acquired: [2:03/25/2016] [3:06/03/2016] [N/A:N/A] Weeks of Treatment: [2:0] [3:0] [N/A:N/A] Wound Status: [2:Open] [3:Open] [N/A:N/A] Measurements L x W x D 1x0.7x0.5 [3:0.6x1.5x0.1] [N/A:N/A] (cm) Area (cm) : [2:0.55] [3:0.707] [N/A:N/A] Volume (cm) : [2:0.275] [3:0.071] [N/A:N/A] % Reduction in Area: [2:0.00%] [3:N/A] [N/A:N/A] % Reduction in Volume: 0.00% [3:N/A] [N/A:N/A] Starting Position 1 11 (o'clock): Ending Position 1 [2:4] (o'clock): Maximum Distance 1 1.7 (cm): Undermining: [2:Yes] [3:No] [N/A:N/A] Classification: [2:Grade 2] [3:Category/Stage I] [  N/A:N/A] HBO Classification: [2:N/A] [3:Grade 1] [N/A:N/A] Exudate Amount: Large Small N/A Exudate Type: Serous Serous N/A Exudate Color: amber amber N/A Wound Margin: Epibole Flat and Intact N/A Granulation Amount: N/A None Present (0%) N/A Necrotic Amount: N/A None Present (0%) N/A Exposed Structures: Fat: Yes Fascia: No N/A Fascia: No Fat: No Tendon: No Tendon: No Muscle: No Muscle: No Joint: No Joint: No Bone: No Bone: No Limited to Skin Breakdown Epithelialization: None None N/A Periwound Skin Texture: Callus: Yes No Abnormalities Noted N/A Periwound Skin No Abnormalities Noted  Maceration: Yes N/A Moisture: Periwound Skin Color: No Abnormalities Noted No Abnormalities Noted N/A Tenderness on No No N/A Palpation: Wound Preparation: Ulcer Cleansing: Ulcer Cleansing: N/A Rinsed/Irrigated with Rinsed/Irrigated with Saline Saline Topical Anesthetic Topical Anesthetic Applied: Other: lidocaine Applied: Other: lidocaine 4% 4% Treatment Notes Electronic Signature(s) Signed: 06/05/2016 1:49:48 PM By: Gretta Cool, RN, BSN, Kim RN, BSN Entered By: Gretta Cool, RN, BSN, Kim on 06/05/2016 11:00:14 [place], [name] ([id number]) -------------------------------------------------------------------------------- [place] Details Patient Name: [name], [name] Date of Service: 06/05/2016 9:45 AM Medical Record Patient Account Number: [id number] Number: Treating RN: Cornell Barman May 11, 1946 (70 y.o. Other Clinician: Date of Birth/Sex: Female) Treating ROBSON, MICHAEL Primary Care Physician/Extender: Estanislado Pandy, Cecille Rubin Physician: Referring Physician: Gardiner Barefoot Weeks in Treatment: 0 Active Inactive Medication Nursing Diagnoses: Knowledge deficit related to medication safety: actual or potential Goals: Patient/caregiver will demonstrate understanding of new oral/IV medications prescribed at the [facility] (topical prescriptions are covered under the skin breakdown problem) Date Initiated: 06/05/2016 Goal Status: Active Interventions: Assess patient/caregiver ability to manage medication regimen upon admission and as needed Patient/Caregiver given reconciled medication list upon admission, changes in medications and discharge from the [place] Notes: Nutrition Nursing Diagnoses: Potential for alteratiion in Nutrition/Potential for imbalanced nutrition Goals: Patient/caregiver verbalizes understanding of need to maintain therapeutic glucose control per primary care physician Date Initiated: 06/05/2016 Goal Status: Active Interventions: Provide  education on elevated blood sugars and impact on wound healing Notes: Orientation to the [place]. ([id number]) Nursing Diagnoses: Knowledge deficit related to the wound healing center program Goals: Patient/caregiver will verbalize understanding of the [place] Program Date Initiated: 06/05/2016 Goal Status: Active Interventions: Provide education on orientation to the wound center Notes: Pressure Nursing Diagnoses: Knowledge deficit related to management of pressures ulcers Goals: Patient will remain free of pressure ulcers Date Initiated: 06/05/2016 Goal Status: Active Interventions: Assess: immobility, friction, shearing, incontinence upon admission and as needed Treatment Activities: Patient referred for seating evaluation to ensure proper offloading : 06/05/2016 Notes: Wound/Skin Impairment Nursing Diagnoses: Impaired tissue integrity Goals: Ulcer/skin breakdown will heal within 14 weeks Date Initiated: 06/05/2016 Goal Status: Active Interventions: Assess patient/caregiver ability to obtain necessary supplies Notes: ANGELEE, BAHR ([id number]) Electronic Signature(s) Signed: 06/05/2016 1:49:48 PM By: Gretta Cool, RN, BSN, Kim RN, BSN Entered By: Gretta Cool, RN, BSN, Kim on 06/05/2016 10:58:47 [name], [name] ([id number]) -------------------------------------------------------------------------------- Pain Assessment Details Patient Name: Mcphee, [name] L. Date of Service: 06/05/2016 9:45 AM Medical Record Patient Account Number: [id number] Number: Treating RN: Cornell Barman Aug 08, 1945 (70 y.o. Other Clinician: Date of Birth/Sex: Female) Treating ROBSON, MICHAEL Primary Care Physician/Extender: Estanislado Pandy, Cecille Rubin Physician: Referring Physician: Gardiner Barefoot Weeks in Treatment: 0 Active Problems Location of Pain Severity and Description of Pain Patient Has Paino No Site Locations With Dressing Change: No Pain Management  and Medication Current Pain Management: Goals for Pain Management Topical or injectable lidocaine is offered to patient for acute pain when surgical debridement is performed. If  needed, Patient is instructed to use over the counter pain medication for the following 24-48 hours after debridement. Wound care MDs do not prescribed pain medications. Patient has chronic pain or uncontrolled pain. Patient has been instructed to make an appointment with their Primary Care Physician for pain management. Electronic Signature(s) Signed: 06/05/2016 1:49:48 PM By: Gretta Cool, RN, BSN, Kim RN, BSN Entered By: Gretta Cool, RN, BSN, Kim on 06/05/2016 10:12:23 [place], [name] ([id number]) -------------------------------------------------------------------------------- Patient/Caregiver Education Details Patient Name: Battin, Calena L. Date of Service: 06/05/2016 9:45 AM Medical Record Patient Account Number: [id number] Number: Treating RN: Cornell Barman Sep 13, 1945 (70 y.o. Other Clinician: Date of Birth/Gender: Female) Treating ROBSON, MICHAEL Primary Care Physician/Extender: Ashok Croon Physician: [place] in Treatment: 0 Referring Physician: Gardiner Barefoot Education Assessment Education Provided To: Patient Education Topics Provided Elevated Blood Sugar/ Impact on Healing: Handouts: Elevated Blood Sugars: How Do They Affect Wound Healing Methods: Explain/Verbal Responses: State content correctly Offloading: Handouts: Other: wedge shoe Methods: Demonstration, Explain/Verbal Responses: State content correctly Welcome To [facility]: Handouts: Welcome To [facility] Methods: Demonstration Responses: State content correctly Electronic Signature(s) Signed: 06/05/2016 1:49:48 PM By: Gretta Cool, RN, BSN, Kim RN, BSN Entered By: Gretta Cool, RN, BSN, Kim on 06/05/2016 11:34:54 [place], [name]  ([id number]) -------------------------------------------------------------------------------- Wound Assessment Details Patient Name: Slavick, Sharmila L. Date of Service: 06/05/2016 9:45 AM Medical Record Patient Account Number: [id number] Number: Treating RN: Cornell Barman Jul 22, 1945 (70 y.o. Other Clinician: Date of Birth/Sex: Female) Treating ROBSON, MICHAEL Primary Care Physician/Extender: Estanislado Pandy, Cecille Rubin Physician: Referring Physician: Gardiner Barefoot Weeks in Treatment: 0 Wound Status Wound Number: 2 Primary Etiology: Diabetic Wound/Ulcer of the Lower Extremity Wound Location: Left Foot - Plantar Secondary Pressure Ulcer Wounding Event: Gradually Appeared Etiology: Date Acquired: 03/25/2016 Wound Status: Open Weeks Of Treatment: 0 Comorbid Hypertension, Type II Diabetes Clustered Wound: No History: Photos Photo Uploaded By: Gretta Cool, RN, BSN, Kim on 06/06/2016 11:35:59 Wound Measurements Length: (cm) 1 % Reduction in Ar Width: (cm) 0.7 % Reduction in Vo Depth: (cm) 0.5 Epithelialization Area: (cm) 0.55 Tunneling: Volume: (cm) 0.275 Undermining: Starting Posit Ending Positio Maximum Distan ea: 0% lume: 0% : None No Yes ion (o'clock): 11 n (o'clock): 4 ce: (cm) 1.7 Wound Description Classification: Grade 2 Foul Odor After Wound Margin: Epibole Exudate Amount: Large Exudate Type: Serous Exudate Color: amber Patil, Magie L. ([id number]) Cleansing: No Wound Bed Exposed Structure Fascia Exposed: No Fat Layer Exposed: Yes Tendon Exposed: No Muscle Exposed: No Joint Exposed: No Bone Exposed: No Periwound Skin Texture Texture Color No Abnormalities Noted: No No Abnormalities Noted: No Callus: Yes Moisture No Abnormalities Noted: No Wound Preparation Ulcer Cleansing: Rinsed/Irrigated with Saline Topical Anesthetic Applied: Other: lidocaine 4%, Treatment Notes Wound #2 (Left, Plantar Foot) 1. Cleansed with: Clean wound with Normal  Saline 2. Anesthetic Topical Lidocaine 4% cream to wound bed prior to debridement 4. Dressing Applied: Aquacel Ag 5. Secondary Dressing Applied ABD and Kerlix/Conform 6. Footwear/Offloading device applied Wedge shoe 7. Secured with Recruitment consultant) Signed: 06/05/2016 1:49:48 PM By: Gretta Cool, RN, BSN, Kim RN, BSN Entered By: Gretta Cool, RN, BSN, Kim on 06/05/2016 10:43:13 [place], [name] ([id number]) -------------------------------------------------------------------------------- Wound Assessment Details Patient Name: Rasco, Cande L. Date of Service: 06/05/2016 9:45 AM Medical Record Patient Account Number: [id number] Number: Treating RN: Cornell Barman [birth year] (70 y.o. Other Clinician: Date of Birth/Sex: Female) Treating ROBSON, MICHAEL Primary Care Physician/Extender: Estanislado Pandy, Cecille Rubin Physician: Referring Physician: Gardiner Barefoot Weeks in Treatment: 0 Wound Status Wound Number: 3 Primary Etiology: Pressure  Ulcer Wound Location: Left Foot - Lateral Secondary Diabetic Wound/Ulcer of the Lower Etiology: Extremity Wounding Event: Blister Wound Status: Open Date Acquired: 06/03/2016 Comorbid Hypertension, Type II Diabetes Weeks Of Treatment: 0 History: Clustered Wound: No Photos Photo Uploaded By: Gretta Cool, RN, BSN, Kim on 06/06/2016 11:36:10 Wound Measurements Length: (cm) 0.6 Width: (cm) 1.5 Depth: (cm) 0.1 Area: (cm) 0.707 Volume: (cm) 0.071 % Reduction in Area: % Reduction in Volume: Epithelialization: None Tunneling: No Undermining: No Wound Description Classification: Category/Stage I Diabetic Severity (Wagner): Grade 1 Wound Margin: Flat and Intact Exudate Amount: Small Exudate Type: Serous Exudate Color: amber Wound Bed Granulation Amount: None Present (0%) Exposed Structure Necrotic Amount: None Present (0%) Fascia Exposed: No Fat Layer Exposed: No Grzesiak, Lamis L. ([id number]) Tendon Exposed: No Muscle Exposed: No Joint  Exposed: No Bone Exposed: No Limited to Skin Breakdown Periwound Skin Texture Texture Color No Abnormalities Noted: No No Abnormalities Noted: No Moisture No Abnormalities Noted: No Maceration: Yes Wound Preparation Ulcer Cleansing: Rinsed/Irrigated with Saline Topical Anesthetic Applied: Other: lidocaine 4%, Treatment Notes Wound #3 (Left, Lateral Foot) 1. Cleansed with: Clean wound with Normal Saline 2. Anesthetic Topical Lidocaine 4% cream to wound bed prior to debridement 4. Dressing Applied: Aquacel Ag 5. Secondary Dressing Applied ABD and Kerlix/Conform 6. Footwear/Offloading device applied Wedge shoe 7. Secured with Recruitment consultant) Signed: 06/05/2016 1:49:48 PM By: Gretta Cool, RN, BSN, Kim RN, BSN Entered By: Gretta Cool, RN, BSN, Kim on 06/05/2016 10:40:39 [place], [name] ([id number]) -------------------------------------------------------------------------------- Vitals Details Patient Name: Rojek, Paylin L. Date of Service: 06/05/2016 9:45 AM Medical Record Patient Account Number: [id number] Number: Treating RN: Cornell Barman August 20, 1945 (70 y.o. Other Clinician: Date of Birth/Sex: Female) Treating ROBSON, MICHAEL Primary Care Physician/Extender: Estanislado Pandy, Cecille Rubin Physician: Referring Physician: Gardiner Barefoot Weeks in Treatment: 0 Vital Signs Time Taken: 10:18 Temperature (F): 97.8 Height (in): 62 Pulse (bpm): 94 Source: Stated Respiratory Rate (breaths/min): 16 Weight (lbs): 140 Blood Pressure (mmHg): 227/72 Source: Stated Reference Range: 80 - 120 mg / dl Body Mass Index (BMI): 25.6 Electronic Signature(s) Signed: 06/05/2016 1:49:48 PM By: Gretta Cool, RN, BSN, Kim RN, BSN Entered By: Gretta Cool, RN, BSN, Kim on 06/05/2016 10:19:14

## 2016-06-06 NOTE — Progress Notes (Signed)
KINSLEY, HOLDERMAN ([id number]) Visit Report for 06/05/2016 Chief Complaint Document Details Patient Name: [name], [name] Date of Service: 06/05/2016 9:45 AM Medical Record Patient Account Number: [id number] Number: Treating RN: Ahmed Prima Feb 20, 1946 (70 y.o. Other Clinician: Date of Birth/Sex: Female) Treating Taneil Lazarus Primary Care Physician/Extender: Ashok Croon Physician: Referring Physician: Gardiner Barefoot Weeks in Treatment: 0 Information Obtained from: Patient Chief Complaint 06/05/16; patient is here for review of a wound on her right foot which is been present for at least a month Electronic Signature(s) Signed: 06/05/2016 4:55:30 PM By: Linton Ham MD Entered By: Linton Ham on 06/05/2016 11:42:31 [place], [place]. ([id number]) -------------------------------------------------------------------------------- Debridement Details Patient Name: [name], [name] Date of Service: 06/05/2016 9:45 AM Medical Record Patient Account Number: [id number] Number: Treating RN: Cornell Barman [birth year] (70 y.o. Other Clinician: Date of Birth/Sex: Female) Treating Naviyah Schaffert Primary Care Physician/Extender: Estanislado Pandy, Cecille Rubin Physician: Referring Physician: Gardiner Barefoot Weeks in Treatment: 0 Debridement Performed for Wound #2 Left,Plantar Foot Assessment: Performed By: Physician Ricard Dillon, MD Debridement: Debridement Pre-procedure Yes - 10:55 Verification/Time Out Taken: Start Time: 10:56 Pain Control: Other : lidocaine 4% Level: Skin/Subcutaneous Tissue Total Area Debrided (L x 1 (cm) x 0.7 (cm) = 0.7 (cm) W): Tissue and other Viable, Callus, Fibrin/Slough, Skin, Subcutaneous material debrided: Instrument: Blade, Forceps Bleeding: Moderate Hemostasis Achieved: Silver Nitrate End Time: 10:59 Procedural Pain: 0 Post Procedural Pain: 0 Response to Treatment: Procedure was tolerated well Post Debridement Measurements  of Total Wound Length: (cm) 1 Width: (cm) 1.4 Depth: (cm) 1.2 Volume: (cm) 1.319 Character of Wound/Ulcer Post Requires Further Debridement Debridement: Severity of Tissue Post Debridement: Fat layer exposed Post Procedure Diagnosis Same as Pre-procedure Electronic Signature(s) Signed: 06/05/2016 1:49:48 PM By: Gretta Cool, RN, BSN, Kim RN, BSN [place], [place] ([id number]) Signed: 06/05/2016 4:55:30 PM By: Linton Ham MD Entered By: Gretta Cool, RN, BSN, Kim on 06/05/2016 11:16:51 [place], [place] ([id number]) -------------------------------------------------------------------------------- HPI Details Patient Name: [name], [name] Date of Service: 06/05/2016 9:45 AM Medical Record Patient Account Number: [id number] Number: Treating RN: Ahmed Prima 03-15-1946 (70 y.o. Other Clinician: Date of Birth/Sex: Female) Treating Gergory Biello Primary Care Physician/Extender: Estanislado Pandy, Cecille Rubin Physician: Referring Physician: Gardiner Barefoot Weeks in Treatment: 0 History of Present Illness HPI Description: The patient is here for followup. Denies fevers, has been compliant with wearing Darco shoes. She states that her blood sugars are still in the low 200s. [name] is a 25F with a h/o DM who presents with an open ulcer on the plantar aspect of her L great toe. She has had it for close to 8 months. She has also had several debridements as well as courses of oral antibiotics. Currently, she denies fevers and has no pain. She has never had ulcers like this in the past. Her blood sugars are normally in the 170s. Foot xray was negative for osteomyelitis. HgbA1c was 10.3. READMISSION; 06/05/16; [name] is a 70 year old type II diabetic by description not well-controlled. She does not have known PAD or claudication. She has apparently some degree of neuropathy or she's been told that in the past. She has a history of a wound on the plantar left foot foot for which she was seen here  in 2015 although I was not involved in her care nor were any other current physicians working in our clinics. She was managed with standard dressings and a Darco forefoot offloading boot and she seems to have healed quite quickly. Apparently the patient noticed a callus/wound development a  month ago although her daughter who is present states it was longer than that. Indeed the patient seems to been followed for at least 3 clinic visits with her podiatrist Dr. Gardiner Barefoot. On October 19 noted a necrotic ulcer on the plantar aspect of the left first metatarsal head. She was using Silvadene based dressings given Keflex on 10/9. I don't believe any cultures or x-rays were done. More importantly she doesn't seem to be adequately offloading this area in her footwear ABIs in this clinic were 0.88 bilaterally. Electronic Signature(s) Signed: 06/05/2016 4:55:30 PM By: Linton Ham MD Entered By: Linton Ham on 06/05/2016 12:00:00 [name], Geoffrey Carlean Jews ([id number]) -------------------------------------------------------------------------------- Physical Exam Details Patient Name: [name], [name] Date of Service: 06/05/2016 9:45 AM Medical Record Patient Account Number: [id number] Number: Treating RN: Ahmed Prima 1946-02-17 (70 y.o. Other Clinician: Date of Birth/Sex: Female) Treating Ifeoluwa Beller Primary Care Physician/Extender: Estanislado Pandy, Cecille Rubin Physician: Referring Physician: Gardiner Barefoot Weeks in Treatment: 0 Constitutional Patient is hypertensive. Unfortunately I did not note this until she left the clinic. Our clinic nurse did tell me about this before it went in the room. Pulse regular and within target range for patient.Marland Kitchen Respirations regular, non-labored and within target range.. Temperature is normal and within the target range for the patient.. Patient's appearance is neat and clean. Appears in no acute distress. Well nourished and well  developed.. Eyes Conjunctivae clear. No discharge.Marland Kitchen Respiratory Respiratory effort is easy and symmetric bilaterally. Rate is normal at rest and on room air.. Bilateral breath sounds are clear and equal in all lobes with no wheezes, rales or rhonchi.. Cardiovascular Heart rhythm and rate regular, without murmur or gallop.. Dorsalis pedis pulses are present bilaterally. Gastrointestinal (GI) Abdomen is soft and non-distended without masses or tenderness. Bowel sounds active in all quadrants.. Genitourinary (GU) Bladder without fullness, masses or tenderness.. Lymphatic None palpable in the popliteal and inguinal area on the left. Integumentary (Hair, Skin) See below under wound exam. See below under wound exam. Neurological Sensation normal to touch, pin, and vibration. Sensation intact to 10 gram monofilament in extremities.Marland Kitchen Psychiatric No evidence of depression, anxiety, or agitation. Calm, cooperative, and communicative. Appropriate interactions and affect.. Notes Wound exam; the area questions over her plantar first metatarsal head on the left. This is a small open area surrounded by callus however there is considerable depth of this laterally and I think this wound is extensive. Using a pickups and scalpel I removed some skin subcutaneous tissue which was nonviable from the surface of this area but there is much more debridement here then could be done easily today. This is at least a deep Wagner 2 wound. I did not palpate any bone. There was no surrounding erythema and no tenderness. No drainage was noted and nothing needed to be culture NELLI, SWALLEY ([id number]) Electronic Signature(s) Signed: 06/05/2016 4:55:30 PM By: Linton Ham MD Entered By: Linton Ham on 06/05/2016 12:05:18 [place], [place] L. ([id number]) -------------------------------------------------------------------------------- Physician Orders Details Patient Name: [name], [name] Date of Service:  06/05/2016 9:45 AM Medical Record Patient Account Number: [id number] Number: Treating RN: Cornell Barman December 09, 1945 (70 y.o. Other Clinician: Date of Birth/Sex: Female) Treating Geraldin Habermehl Primary Care Physician/Extender: Estanislado Pandy, Cecille Rubin Physician: Referring Physician: Bayard Hugger in Treatment: 0 Verbal / Phone Orders: Yes Clinician: Cornell Barman Read Back and Verified: Yes Diagnosis Coding Wound Cleansing Wound #2 Left,Plantar Foot o Clean wound with Normal Saline. Wound #3 Left,Lateral Foot o Clean wound with Normal Saline. Anesthetic Wound #2 Left,Plantar Foot   o Topical Lidocaine 4% cream applied to wound bed prior to debridement Wound #3 Left,Lateral Foot o Topical Lidocaine 4% cream applied to wound bed prior to debridement Primary Wound Dressing Wound #2 Left,Plantar Foot o Aquacel Ag Wound #3 Left,Lateral Foot o Aquacel Ag Secondary Dressing Wound #2 Left,Plantar Foot o ABD and Kerlix/Conform Wound #3 Left,Lateral Foot o ABD and Kerlix/Conform Dressing Change Frequency Wound #2 Left,Plantar Foot o Change dressing every other day. Wound #3 Left,Lateral Foot Pritz, Jacquline L. ([id number]) o Change dressing every other day. Follow-up Appointments Wound #2 [place] o Return Appointment in 1 week. Wound #3 Left,Lateral Foot o Return Appointment in 1 week. Off-Loading Wound #2 Left,Plantar Foot o Other: - front-offloader Wound #3 Left,Lateral Foot o Other: - front-offloader Additional Orders / Instructions Wound #2 Left,Plantar Foot o Increase protein intake. Wound #3 Left,Lateral Foot o Increase protein intake. Radiology o X-ray, foot - left plantar foot Services and Therapies o Arterial Studies- Unilateral - Left Electronic Signature(s) Signed: 06/05/2016 1:49:48 PM By: Gretta Cool RN, BSN, Kim RN, BSN Signed: 06/05/2016 4:55:30 PM By: Linton Ham MD Entered By: Gretta Cool, RN, BSN, Kim on  06/05/2016 11:37:13 [place], [place] ([id number]) -------------------------------------------------------------------------------- Problem List Details Patient Name: Berlanga, Accalia L. Date of Service: 06/05/2016 9:45 AM Medical Record Patient Account Number: [id number] Number: Treating RN: Ahmed Prima [birth year] (70 y.o. 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 Date of Service: 06/05/2016 9:45 AM Medical Record Patient Account Number: [id number] Number: Treating RN: Ahmed Prima 1946/03/13 (70 y.o. Other Clinician: Date of Birth/Sex: Female) Treating Lindbergh Winkles Primary Care Physician/Extender: Ashok Croon Physician: Referring Physician: Gardiner Barefoot Weeks in Treatment: 0 Subjective Chief Complaint Information obtained from Patient 06/05/16; patient is here for review of a wound on her right foot which is been present for at least a month History of Present Illness (HPI) The patient is here for followup. Denies fevers, has been compliant with wearing Darco shoes. She states that her blood sugars are still in the low 200s. Caprice is a 56F with a h/o DM who presents with an open ulcer on the plantar aspect of her L great toe. She has  had it for close to 8 months. She has also had several debridements as well as courses of oral antibiotics. Currently, she denies fevers and has no pain. She has never had ulcers like this in the past. Her blood sugars are normally in the 170s. Foot xray was negative for osteomyelitis. HgbA1c was 10.3. READMISSION; 06/05/16; Mrs. Thornsberry is a 70 year old type II diabetic by description not well-controlled. She does not have known PAD or claudication. She has apparently some degree of neuropathy or she's been told that in the past. She has a history of a wound on the plantar left foot foot for which she was seen here in 2015 although I was not involved in her care nor were any other current physicians working in our clinics. She was managed with standard dressings and a Darco forefoot offloading boot and  she seems to have healed quite quickly. Apparently the patient noticed a callus/wound development a month ago although her daughter who is present states it was longer than that. Indeed the patient seems to been followed for at least 3 clinic visits with her podiatrist Dr. Gardiner Barefoot. On October 19 noted a necrotic ulcer on the plantar aspect of the left first metatarsal head. She was using Silvadene based dressings given Keflex on 10/9. I don't believe any cultures or x-rays were done. More importantly she doesn't seem to be adequately offloading this area in her footwear ABIs in this clinic were 0.88 bilaterally. Wound History Patient presents with 2 open wounds that have been present for approximately 2 months. Patient has been [name], [name] ([id number]) treating wounds in the following manner: neosporin. Laboratory tests have not been performed in the last month. Patient reportedly has not tested positive for an antibiotic resistant organism. Patient reportedly has not tested positive for osteomyelitis. Patient reportedly has not had testing performed to evaluate circulation in the  legs. Patient History Information obtained from Patient. Allergies codeine, seasonal allergies Family History No family history of Cancer, Diabetes, Heart Disease, Hereditary Spherocytosis, Hypertension, Kidney Disease, Lung Disease, Seizures, Stroke, Thyroid Problems, Tuberculosis. Social History Never smoker, Marital Status - Single, Alcohol Use - Never, Drug Use - No History, Caffeine Use - Daily. Medical History Eyes Denies history of Cataracts, Glaucoma Ear/Nose/Mouth/Throat Denies history of Chronic sinus problems/congestion, Middle ear problems Hematologic/Lymphatic Denies history of Anemia, Hemophilia, Human Immunodeficiency Virus, Lymphedema, Sickle Cell Disease Respiratory Denies history of Aspiration, Asthma, Chronic Obstructive Pulmonary Disease (COPD), Pneumothorax, Sleep Apnea, Tuberculosis Cardiovascular Patient has history of Hypertension Denies history of Angina, Arrhythmia, Congestive Heart Failure, Deep Vein Thrombosis, Hypotension, Myocardial Infarction, Peripheral Arterial Disease, Peripheral Venous Disease, Phlebitis, Vasculitis Gastrointestinal Denies history of Cirrhosis , Colitis, Crohn s, Hepatitis A, Hepatitis B, Hepatitis C Endocrine Patient has history of Type II Diabetes Denies history of Type I Diabetes Genitourinary Denies history of End Stage Renal Disease Immunological Denies history of Lupus Erythematosus, Raynaud s, Scleroderma Integumentary (Skin) Denies history of History of Burn, History of pressure wounds Musculoskeletal Denies history of Gout, Rheumatoid Arthritis, Osteoarthritis, Osteomyelitis Neurologic Denies history of Dementia, Neuropathy, Quadriplegia, Paraplegia, Seizure Disorder Oncologic Denies history of Received Chemotherapy, Received Radiation Psychiatric Herber, Sadeen L. ([id number]) Denies history of Anorexia/bulimia, Confinement Anxiety Patient is treated with Oral Agents. Blood sugar is not tested. Review of Systems  (ROS) Constitutional Symptoms (General Health) The patient has no complaints or symptoms. Eyes The patient has no complaints or symptoms. Ear/Nose/Mouth/Throat The patient has no complaints or symptoms. Hematologic/Lymphatic The patient has no complaints or symptoms. Respiratory The patient has no complaints or symptoms. Cardiovascular The patient has no complaints or symptoms. Gastrointestinal The patient has no complaints or symptoms. Endocrine The patient has no complaints or symptoms. Genitourinary The patient has no complaints or symptoms. Immunological The patient has no complaints or symptoms. Integumentary (Skin) Complains or has symptoms of Wounds, Bleeding or bruising tendency. Denies complaints or symptoms of Breakdown, Swelling. Musculoskeletal The patient has no complaints or symptoms. Neurologic The patient has no complaints or symptoms. Oncologic The patient has no complaints or symptoms. Psychiatric The patient has no complaints or symptoms. Objective Constitutional Patient is hypertensive. Unfortunately I did not note this until she left the clinic. Our clinic nurse did tell me about this before it went in the room. Pulse regular and within target range for patient.Marland Kitchen Respirations regular, non-labored and within target range.. Temperature is normal and  within the target range for the patient.. Patient's appearance is neat and clean. Appears in no acute distress. Well nourished and well developed.Marland Kitchen [place], [place] ([id number]) Vitals Time Taken: 10:18 AM, Height: 62 in, Source: Stated, Weight: 140 lbs, Source: Stated, BMI: 25.6, Temperature: 97.8 F, Pulse: 94 bpm, Respiratory Rate: 16 breaths/min, Blood Pressure: 227/72 mmHg. Eyes Conjunctivae clear. No discharge.Marland Kitchen Respiratory Respiratory effort is easy and symmetric bilaterally. Rate is normal at rest and on room air.. Bilateral breath sounds are clear and equal in all lobes with no wheezes, rales or  rhonchi.. Cardiovascular Heart rhythm and rate regular, without murmur or gallop.. Dorsalis pedis pulses are present bilaterally. Gastrointestinal (GI) Abdomen is soft and non-distended without masses or tenderness. Bowel sounds active in all quadrants.. Genitourinary (GU) Bladder without fullness, masses or tenderness.. Lymphatic None palpable in the popliteal and inguinal area on the left. Neurological Sensation normal to touch, pin, and vibration. Sensation intact to 10 gram monofilament in extremities.Marland Kitchen Psychiatric No evidence of depression, anxiety, or agitation. Calm, cooperative, and communicative. Appropriate interactions and affect.. General Notes: Wound exam; the area questions over her plantar first metatarsal head on the left. This is a small open area surrounded by callus however there is considerable depth of this laterally and I think this wound is extensive. Using a pickups and scalpel I removed some skin subcutaneous tissue which was nonviable from the surface of this area but there is much more debridement here then could be done easily today. This is at least a deep Wagner 2 wound. I did not palpate any bone. There was no surrounding erythema and no tenderness. No drainage was noted and nothing needed to be culture Integumentary (Hair, Skin) See below under wound exam. See below under wound exam. Wound #2 status is Open. Original cause of wound was Gradually Appeared. The wound is located on the [place]. The wound measures 1cm length x 0.7cm width x 0.5cm depth; 0.55cm^2 area and 0.275cm^3 volume. There is fat exposed. There is no tunneling noted, however, there is undermining starting at 11:00 and ending at 4:00 with a maximum distance of 1.7cm. There is a large amount of serous drainage noted. The wound margin is epibole. The periwound skin appearance exhibited: Callus. Wound #3 status is Open. Original cause of wound was Blister. The wound is located on the  Left,Lateral Foot. The wound measures 0.6cm length x 1.5cm width x 0.1cm depth; 0.707cm^2 area and 0.071cm^3 volume. The wound is limited to skin breakdown. There is no tunneling or undermining noted. There is a small amount of serous drainage noted. The wound margin is flat and intact. There is no granulation within [name], [name] ([id number]) the wound bed. There is no necrotic tissue within the wound bed. The periwound skin appearance exhibited: Maceration. Assessment Active Problems ICD-10 E11.621 - Type 2 diabetes mellitus with foot ulcer L97.522 - Non-pressure chronic ulcer of other part of left foot with fat layer exposed Procedures Wound #2 Wound #2 is a Diabetic Wound/Ulcer of the Lower Extremity located on the Left,Plantar Foot . There was a Skin/Subcutaneous Tissue Debridement ([id number]) debridement with total area of 0.7 sq cm performed by Ricard Dillon, MD. with the following instrument(s): Blade and Forceps to remove Viable tissue/material including Fibrin/Slough, Skin, Callus, and Subcutaneous after achieving pain control using Other (lidocaine 4%). A time out was conducted at 10:55, prior to the start of the procedure. A Moderate amount of bleeding was controlled with Silver Nitrate. The procedure was tolerated well with a  pain level of 0 throughout and a pain level of 0 following the procedure. Post Debridement Measurements: 1cm length x 1.4cm width x 1.2cm depth; 1.319cm^3 volume. Character of Wound/Ulcer Post Debridement requires further debridement. Severity of Tissue Post Debridement is: Fat layer exposed. Post procedure Diagnosis Wound #2: Same as Pre-Procedure Plan Wound Cleansing: Wound #2 Left,Plantar Foot: Clean wound with Normal Saline. Wound #3 Left,Lateral Foot: Clean wound with Normal Saline. Anesthetic: Wound #2 Left,Plantar Foot: Topical Lidocaine 4% cream applied to wound bed prior to debridement Staggs, Kionna L. ([id number]) Wound #3  Left,Lateral Foot: Topical Lidocaine 4% cream applied to wound bed prior to debridement Primary Wound Dressing: Wound #2 Left,Plantar Foot: Aquacel Ag Wound #3 Left,Lateral Foot: Aquacel Ag Secondary Dressing: Wound #2 Left,Plantar Foot: ABD and Kerlix/Conform Wound #3 Left,Lateral Foot: ABD and Kerlix/Conform Dressing Change Frequency: Wound #2 Left,Plantar Foot: Change dressing every other day. Wound #3 Left,Lateral Foot: Change dressing every other day. Follow-up Appointments: Wound #2 Left,Plantar Foot: Return Appointment in 1 week. Wound #3 Left,Lateral Foot: Return Appointment in 1 week. Off-Loading: Wound #2 Left,Plantar Foot: Other: - front-offloader Wound #3 Left,Lateral Foot: Other: - front-offloader Additional Orders / Instructions: Wound #2 Left,Plantar Foot: Increase protein intake. Wound #3 Left,Lateral Foot: Increase protein intake. Radiology ordered were: X-ray, foot - left plantar foot Services and Therapies ordered were: Arterial Studies- Unilateral - Left #1 this is a deep wound which probes laterally across the surface of his foot with considerable undermining. I debrided this superficially but there was much more to be done here that I could not do easily today. #2 no clear evidence of infection. No cultures were done and no additional antibiotics at this point #3 clearly needs an x-ray of her foot and possibly will need an MRI [name], [name] ([id number]) #4 bilateral of suspicion of significant PAD is not high nevertheless her ABIs were 0.88 bilaterally and we have sent her for arterial studies including ABIs and arterial Dopplers #5 we dressed this today with silver alginate packing, AVD, Kerlix and Coban. Darco forefoot offloading boot. #6 a total contact cast may be necessary if we do not identify significant underlying infection #7 although the patient diver tries to resolve is having neuropathy, our testing here was quite normal. 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 Even her vibration testing was normal Electronic Signature(s) Signed: 06/05/2016 1:47:49 PM By: Gretta Cool RN, BSN, Kim RN, BSN Signed: 06/05/2016 4:55:30 PM By: Linton Ham MD Entered By: Gretta Cool, RN, BSN, Kim on 06/05/2016 13:47:49 [place], [name] ([id number]) -------------------------------------------------------------------------------- ROS/PFSH Details Patient Name: [name], [name] Date of Service: 06/05/2016 9:45 AM Medical Record Patient Account Number: [id number] Number: Treating RN: Cornell Barman 05/19/1946 (70 y.o. Other Clinician: Date of Birth/Sex: Female) Treating Shariq Puig Primary Care Physician/Extender: Estanislado Pandy, Cecille Rubin Physician: Referring Physician: Gardiner Barefoot Weeks in Treatment: 0 Information Obtained From Patient Wound History Do you currently have one or more open woundso Yes How many open wounds do you currently haveo 2 Approximately how long have you had your woundso 2 months How have you been treating your wound(s) until nowo neosporin Has your wound(s) ever healed and then re-openedo No Have you had any lab work done in the past montho No Have you tested positive for an antibiotic resistant organism (MRSA, VRE)o No Have you tested positive for osteomyelitis (bone infection)o No Have you had any tests for circulation on your legso No Integumentary (Skin) Complaints and Symptoms: Positive for: Wounds; Bleeding or bruising tendency Negative for: Breakdown; Swelling Medical History: Negative  for: History of Burn; History of pressure wounds Constitutional Symptoms (General Health) Complaints and Symptoms: No Complaints or Symptoms Eyes Complaints and Symptoms: No Complaints or Symptoms Medical History: Negative for: Cataracts; Glaucoma Ear/Nose/Mouth/Throat Complaints and Symptoms: No Complaints or Symptoms Isip, Ting L. ([id number]) Medical History: Negative for: Chronic sinus problems/congestion;  Middle ear problems Hematologic/Lymphatic Complaints and Symptoms: No Complaints or Symptoms Medical History: Negative for: Anemia; Hemophilia; Human Immunodeficiency Virus; Lymphedema; Sickle Cell Disease Respiratory Complaints and Symptoms: No Complaints or Symptoms Medical History: Negative for: Aspiration; Asthma; Chronic Obstructive Pulmonary Disease (COPD); Pneumothorax; Sleep Apnea; Tuberculosis Cardiovascular Complaints and Symptoms: No Complaints or Symptoms Medical History: Positive for: Hypertension Negative for: Angina; Arrhythmia; Congestive Heart Failure; Deep Vein Thrombosis; Hypotension; Myocardial Infarction; Peripheral Arterial Disease; Peripheral Venous Disease; Phlebitis; Vasculitis Gastrointestinal Complaints and Symptoms: No Complaints or Symptoms Medical History: Negative for: Cirrhosis ; Colitis; Crohnos; Hepatitis A; Hepatitis B; Hepatitis C Endocrine Complaints and Symptoms: No Complaints or Symptoms Medical History: Positive for: Type II Diabetes Negative for: Type I Diabetes Time with diabetes: 10 years Treated with: Oral agents Blood sugar tested every day: No Red, Myleah L. ([id number]) Genitourinary Complaints and Symptoms: No Complaints or Symptoms Medical History: Negative for: End Stage Renal Disease Immunological Complaints and Symptoms: No Complaints or Symptoms Medical History: Negative for: Lupus Erythematosus; Raynaudos; Scleroderma Musculoskeletal Complaints and Symptoms: No Complaints or Symptoms Medical History: Negative for: Gout; Rheumatoid Arthritis; Osteoarthritis; Osteomyelitis Neurologic Complaints and Symptoms: No Complaints or Symptoms Medical History: Negative for: Dementia; Neuropathy; Quadriplegia; Paraplegia; Seizure Disorder Oncologic Complaints and Symptoms: No Complaints or Symptoms Medical History: Negative for: Received Chemotherapy; Received Radiation Psychiatric Complaints and Symptoms: No  Complaints or Symptoms Medical History: Negative for: Anorexia/bulimia; Confinement Anxiety Immunizations Pneumococcal Vaccine: Received Pneumococcal Vaccination: Yes [name], [name] ([id number]) Family and Social History Cancer: No; Diabetes: No; Heart Disease: No; Hereditary Spherocytosis: No; Hypertension: No; Kidney Disease: No; Lung Disease: No; Seizures: No; Stroke: No; Thyroid Problems: No; Tuberculosis: No; Never smoker; Marital Status - Single; Alcohol Use: Never; Drug Use: No History; Caffeine Use: Daily; Financial Concerns: No; Food, Clothing or Shelter Needs: No; Support System Lacking: No; Transportation Concerns: No; Advanced Directives: No; Patient does not want information on Advanced Directives; Living Will: No Electronic Signature(s) Signed: 06/05/2016 1:49:48 PM By: Gretta Cool RN, BSN, Kim RN, BSN Signed: 06/05/2016 4:55:30 PM By: Linton Ham MD Entered By: Gretta Cool RN, BSN, Kim on 06/05/2016 10:32:48 [place], [name] ([id number]) -------------------------------------------------------------------------------- SuperBill Details Patient Name: [name], [name] Date of Service: 06/05/2016 Medical Record Patient Account Number: [id number] Number: Treating RN: Ahmed Prima 1945-10-29 (70 y.o. Other Clinician: Date of Birth/Sex: Female) Treating Indie Boehne Primary Care Physician/Extender: Ashok Croon Physician: Weeks in Treatment: 0 Referring Physician: Gardiner Barefoot Diagnosis Coding ICD-10 Codes Code Description E11.621 Type 2 diabetes mellitus with foot ulcer L97.522 Non-pressure chronic ulcer of other part of left foot with fat layer exposed Facility Procedures CPT4 Code Description: 16010932 99213 - WOUND CARE VISIT-LEV 3 EST PT Modifier: Quantity: 1 CPT4 Code Description: 35573220 11042 - DEB SUBQ TISSUE 20 SQ CM/< ICD-10 Description Diagnosis E11.621 Type 2 diabetes mellitus with foot ulcer L97.522 Non-pressure chronic ulcer of other  part of left foot Modifier: with fat lay Quantity: 1 er exposed Physician Procedures CPT4 Code Description: 2542706 23762 - WC PHYS LEVEL 4 - NEW PT ICD-10 Description Diagnosis E11.621 Type 2 diabetes mellitus with foot ulcer L97.522 Non-pressure chronic ulcer of other part of left foot Modifier: 25 with fat laye Quantity: 1 r exposed CPT4  Code Description: 7841282 11042 - WC PHYS SUBQ TISS 20 SQ CM ICD-10 Description Diagnosis E11.621 Type 2 diabetes mellitus with foot ulcer L97.522 Non-pressure chronic ulcer of other part of left foot Modifier: with fat laye Quantity: 1 r exposed Electronic Signature(s) Signed: 06/05/2016 4:55:30 PM By: Linton Ham MD [place], [name] ([id number]) Entered By: Linton Ham on 06/05/2016 12:11:53

## 2016-06-12 ENCOUNTER — Encounter: Payer: Medicare Other | Attending: Internal Medicine | Admitting: Internal Medicine

## 2016-06-12 DIAGNOSIS — Z7984 Long term (current) use of oral hypoglycemic drugs: Secondary | ICD-10-CM | POA: Insufficient documentation

## 2016-06-12 DIAGNOSIS — Z885 Allergy status to narcotic agent status: Secondary | ICD-10-CM | POA: Diagnosis not present

## 2016-06-12 DIAGNOSIS — E1140 Type 2 diabetes mellitus with diabetic neuropathy, unspecified: Secondary | ICD-10-CM | POA: Insufficient documentation

## 2016-06-12 DIAGNOSIS — L97522 Non-pressure chronic ulcer of other part of left foot with fat layer exposed: Secondary | ICD-10-CM | POA: Insufficient documentation

## 2016-06-12 DIAGNOSIS — E11621 Type 2 diabetes mellitus with foot ulcer: Secondary | ICD-10-CM | POA: Insufficient documentation

## 2016-06-12 DIAGNOSIS — I10 Essential (primary) hypertension: Secondary | ICD-10-CM | POA: Insufficient documentation

## 2016-06-13 NOTE — Progress Notes (Signed)
[name] [name] ([id number]) Visit Report for 06/12/2016 Arrival Information Details Patient Name: [name], [name] Date of Service: 06/12/2016 9:15 AM Medical Record Patient Account Number: [id number] Number: Treating RN: Montey Hora [birth year] (70 y.o. Other Clinician: Date of Birth/Sex: Female) Treating ROBSON, MICHAEL Primary Care Physician/Extender: Estanislado Pandy, Cecille Rubin Physician: Referring Physician: Brendia Sacks in Treatment: 1 Visit Information History Since Last Visit Added or deleted any medications: No Patient Arrived: Ambulatory Any new allergies or adverse reactions: No Arrival Time: 09:18 Had a fall or experienced change in No Accompanied By: dtr activities of daily living that may affect Transfer Assistance: None risk of falls: Patient Identification Verified: Yes Signs or symptoms of abuse/neglect since last No Secondary Verification Process Yes visito Completed: Hospitalized since last visit: No Patient Has Alerts: Yes Pain Present Now: No Patient Alerts: DMII Electronic Signature(s) Signed: 06/12/2016 4:39:21 PM By: Montey Hora Entered By: Montey Hora on 06/12/2016 09:30:55 [name], [name] ([id number]) -------------------------------------------------------------------------------- Encounter Discharge Information Details Patient Name: [name], [name] Date of Service: 06/12/2016 9:15 AM Medical Record Patient Account Number: [id number] Number: Treating RN: Montey Hora March 26, 1946 (70 y.o. Other Clinician: Date of Birth/Sex: Female) Treating ROBSON, MICHAEL Primary Care Physician/Extender: Estanislado Pandy, Cecille Rubin Physician: Referring Physician: Brendia Sacks in Treatment: 1 Encounter Discharge Information Items Discharge Pain Level: 0 Discharge Condition: Stable Ambulatory Status: Ambulatory Discharge Destination: Home Transportation: Private Auto Accompanied By: dtr Schedule Follow-up Appointment: Yes Medication  Reconciliation completed and provided to Patient/Care No Aleanna Menge: Provided on Clinical Summary of Care: 06/12/2016 Form Type Recipient Paper Patient BR Electronic Signature(s) Signed: 06/12/2016 10:37:19 AM By: Ruthine Dose Entered By: Ruthine Dose on 06/12/2016 10:37:19 [name], [name] ([id number]) -------------------------------------------------------------------------------- Lower Extremity Assessment Details Patient Name: Peter, [place] L. Date of Service: 06/12/2016 9:15 AM Medical Record Patient Account Number: [id number] Number: Treating RN: Montey Hora [birth year] (70 y.o. Other Clinician: Date of Birth/Sex: Female) Treating ROBSON, MICHAEL Primary Care Physician/Extender: Estanislado Pandy, Cecille Rubin Physician: Referring Physician: Lanier Prude Weeks in Treatment: 1 Edema Assessment Assessed: [Left: No] [Right: No] Edema: [Left: N] [Right: o] Vascular Assessment Pulses: Posterior Tibial Dorsalis Pedis Palpable: [Left:Yes] Extremity colors, hair growth, and conditions: Extremity Color: [Left:Normal] Hair Growth on Extremity: [Left:Yes] Temperature of Extremity: [Left:Warm] Capillary Refill: [Left:< 3 seconds] Electronic Signature(s) Signed: 06/12/2016 4:39:21 PM By: Montey Hora Entered By: Montey Hora on 06/12/2016 09:26:01 [name] ([id number]) -------------------------------------------------------------------------------- Multi Wound Chart Details Patient Name: Steinmetz, Ilaria L. Date of Service: 06/12/2016 9:15 AM Medical Record Patient Account Number: [id number] Number: Treating RN: Montey Hora Jun 26, 1946 (70 y.o. Other Clinician: Date of Birth/Sex: Female) Treating ROBSON, MICHAEL Primary Care Physician/Extender: Estanislado Pandy, Cecille Rubin Physician: Referring Physician: Brendia Sacks in Treatment: 1 Vital Signs Height(in): 62 Pulse(bpm): 80 Weight(lbs): 140 Blood Pressure 196/73 (mmHg): Body Mass Index(BMI):  26 Temperature(F): 97.7 Respiratory Rate 16 (breaths/min): Photos: [N/A:N/A] Wound Location: Left Foot - Plantar Left Foot - Lateral N/A Wounding Event: Gradually Appeared Blister N/A Primary Etiology: Diabetic Wound/Ulcer of Pressure Ulcer N/A the Lower Extremity Secondary Etiology: Pressure Ulcer Diabetic Wound/Ulcer of N/A the Lower Extremity Comorbid History: Hypertension, Type II Hypertension, Type II N/A Diabetes Diabetes Date Acquired: 03/25/2016 06/03/2016 N/A Weeks of Treatment: 1 1 N/A Wound Status: Open Open N/A Measurements L x W x D 1x1x0.4 0.3x0.7x0.1 N/A (cm) Area (cm) : 0.785 0.165 N/A Volume (cm) : 0.314 0.016 N/A % Reduction in Area: -42.70% 76.70% N/A % Reduction in Volume: -14.20% 77.50% N/A Classification: Grade 2 Category/Stage I N/A HBO Classification: N/A Grade  1 N/A Exudate Amount: Large Small N/A [name], [name] ([id number]) Exudate Type: Serous Serous N/A Exudate Color: amber amber N/A Wound Margin: Epibole Flat and Intact N/A Granulation Amount: Large (67-100%) None Present (0%) N/A Granulation Quality: Red N/A N/A Necrotic Amount: Small (1-33%) None Present (0%) N/A Necrotic Tissue: Eschar N/A N/A Exposed Structures: Fat: Yes Fascia: No N/A Fascia: No Fat: No Tendon: No Tendon: No Muscle: No Muscle: No Joint: No Joint: No Bone: No Bone: No Limited to Skin Breakdown Epithelialization: None None N/A Periwound Skin Texture: Callus: Yes No Abnormalities Noted N/A Edema: No Excoriation: No Induration: No Crepitus: No Fluctuance: No Friable: No Rash: No Scarring: No Periwound Skin Maceration: No Maceration: Yes N/A Moisture: Moist: No Dry/Scaly: No Periwound Skin Color: Atrophie Blanche: No No Abnormalities Noted N/A Cyanosis: No Ecchymosis: No Erythema: No Hemosiderin Staining: No Mottled: No Pallor: No Rubor: No Temperature: No Abnormality N/A N/A Tenderness on No No N/A Palpation: Wound Preparation: Ulcer  Cleansing: Ulcer Cleansing: N/A Rinsed/Irrigated with Rinsed/Irrigated with Saline Saline Topical Anesthetic Topical Anesthetic Applied: Other: lidocaine Applied: Other: lidocaine 4% 4% Treatment Notes [name], [name] ([id number]) Electronic Signature(s) Signed: 06/12/2016 4:39:21 PM By: Montey Hora Entered By: Montey Hora on 06/12/2016 10:12:34 [place], [place] ([id number]) -------------------------------------------------------------------------------- [place] Details Patient Name: [name], [name] Date of Service: 06/12/2016 9:15 AM Medical Record Patient Account Number: [id number] Number: Treating RN: Montey Hora [birth year] (70 y.o. Other Clinician: Date of Birth/Sex: Female) Treating ROBSON, MICHAEL Primary Care Physician/Extender: Estanislado Pandy, Cecille Rubin Physician: Referring Physician: Brendia Sacks in Treatment: 1 Active Inactive Medication Nursing Diagnoses: Knowledge deficit related to medication safety: actual or potential Goals: Patient/caregiver will demonstrate understanding of new oral/IV medications prescribed at the [facility] (topical prescriptions are covered under the skin breakdown problem) Date Initiated: 06/05/2016 Goal Status: Active Interventions: Assess patient/caregiver ability to manage medication regimen upon admission and as needed Patient/Caregiver given reconciled medication list upon admission, changes in medications and discharge from the [place] Notes: Nutrition Nursing Diagnoses: Potential for alteratiion in Nutrition/Potential for imbalanced nutrition Goals: Patient/caregiver verbalizes understanding of need to maintain therapeutic glucose control per primary care physician Date Initiated: 06/05/2016 Goal Status: Active Interventions: Provide education on elevated blood sugars and impact on wound healing Notes: Orientation to the [place], [place]. ([id number]) Nursing  Diagnoses: Knowledge deficit related to the wound healing center program Goals: Patient/caregiver will verbalize understanding of the [place] Program Date Initiated: 06/05/2016 Goal Status: Active Interventions: Provide education on orientation to the wound center Notes: Pressure Nursing Diagnoses: Knowledge deficit related to management of pressures ulcers Goals: Patient will remain free of pressure ulcers Date Initiated: 06/05/2016 Goal Status: Active Interventions: Assess: immobility, friction, shearing, incontinence upon admission and as needed Treatment Activities: Patient referred for seating evaluation to ensure proper offloading : 06/05/2016 Notes: Wound/Skin Impairment Nursing Diagnoses: Impaired tissue integrity Goals: Ulcer/skin breakdown will heal within 14 weeks Date Initiated: 06/05/2016 Goal Status: Active Interventions: Assess patient/caregiver ability to obtain necessary supplies Notes: [name] [name] ([id number]) Electronic Signature(s) Signed: 06/12/2016 4:39:21 PM By: Montey Hora Entered By: Montey Hora on 06/12/2016 10:12:11 Fleig, Crucita L. ([id number]) -------------------------------------------------------------------------------- Pain Assessment Details Patient Name: Nater, Sissi L. Date of Service: 06/12/2016 9:15 AM Medical Record Patient Account Number: [id number] Number: Treating RN: Montey Hora 06/01/1946 (70 y.o. Other Clinician: Date of Birth/Sex: Female) Treating ROBSON, MICHAEL Primary Care Physician/Extender: Estanislado Pandy, Cecille Rubin Physician: Referring Physician: Brendia Sacks in Treatment: 1 Active Problems Location of Pain Severity and  Description of Pain Patient Has Paino No Site Locations Pain Management and Medication Current Pain Management: Notes Topical or injectable lidocaine is offered to patient for acute pain when surgical debridement is performed. If needed, Patient is instructed to  use over the counter pain medication for the following 24-48 hours after debridement. Wound care MDs do not prescribed pain medications. Patient has chronic pain or uncontrolled pain. Patient has been instructed to make an appointment with their Primary Care Physician for pain management. Electronic Signature(s) Signed: 06/12/2016 4:39:21 PM By: Montey Hora Entered By: Montey Hora on 06/12/2016 09:23:15 [name], [name] ([id number]) -------------------------------------------------------------------------------- Patient/Caregiver Education Details Patient Name: Mazurowski, Ireland L. Date of Service: 06/12/2016 9:15 AM Medical Record Patient Account Number: [id number] Number: Treating RN: Montey Hora May 26, 1946 (70 y.o. Other Clinician: Date of Birth/Gender: Female) Treating ROBSON, MICHAEL Primary Care Physician/Extender: Ashok Croon Physician: Weeks in Treatment: 1 Referring Physician: Lanier Prude Education Assessment Education Provided To: Patient and Caregiver Education Topics Provided Wound/Skin Impairment: Handouts: Other: pupose of arterial studies - Dr Dellia Nims Methods: Explain/Verbal Responses: State content correctly Electronic Signature(s) Signed: 06/12/2016 4:39:21 PM By: Montey Hora Entered By: Montey Hora on 06/12/2016 10:15:31 Majerus, Bettymae L. ([id number]) -------------------------------------------------------------------------------- Wound Assessment Details Patient Name: Patron, Laney L. Date of Service: 06/12/2016 9:15 AM Medical Record Patient Account Number: [id number] Number: Treating RN: Montey Hora 09-12-1945 (70 y.o. Other Clinician: Date of Birth/Sex: Female) Treating ROBSON, MICHAEL Primary Care Physician/Extender: Estanislado Pandy, Cecille Rubin Physician: Referring Physician: Brendia Sacks in Treatment: 1 Wound Status Wound Number: 2 Primary Etiology: Diabetic Wound/Ulcer of the Lower Extremity Wound Location: Left  Foot - Plantar Secondary Pressure Ulcer Wounding Event: Gradually Appeared Etiology: Date Acquired: 03/25/2016 Wound Status: Open Weeks Of Treatment: 1 Comorbid Hypertension, Type II Diabetes Clustered Wound: No History: Photos Wound Measurements Length: (cm) 1 % Reduction in Area Width: (cm) 1 % Reduction in Volu Depth: (cm) 0.4 Epithelialization: Area: (cm) 0.785 Tunneling: Volume: (cm) 0.314 Undermining: : -42.7% me: -14.2% None No No Wound Description Classification: Grade 2 Foul Odor After Cle Wound Margin: Epibole Exudate Amount: Large Exudate Type: Serous Exudate Color: amber ansing: No Wound Bed Granulation Amount: Large (67-100%) Exposed Structure Granulation Quality: Red Fascia Exposed: No Aveni, Tally L. ([id number]) Necrotic Amount: Small (1-33%) Fat Layer Exposed: Yes Necrotic Quality: Eschar Tendon Exposed: No Muscle Exposed: No Joint Exposed: No Bone Exposed: No Periwound Skin Texture Texture Color No Abnormalities Noted: No No Abnormalities Noted: No Callus: Yes Atrophie Blanche: No Crepitus: No Cyanosis: No Excoriation: No Ecchymosis: No Fluctuance: No Erythema: No Friable: No Hemosiderin Staining: No Induration: No Mottled: No Localized Edema: No Pallor: No Rash: No Rubor: No Scarring: No Temperature / Pain Moisture Temperature: No Abnormality No Abnormalities Noted: No Dry / Scaly: No Maceration: No Moist: No Wound Preparation Ulcer Cleansing: Rinsed/Irrigated with Saline Topical Anesthetic Applied: Other: lidocaine 4%, Treatment Notes Wound #2 (Left, Plantar Foot) 1. Cleansed with: Clean wound with Normal Saline 2. Anesthetic Topical Lidocaine 4% cream to wound bed prior to debridement 4. Dressing Applied: Aquacel Ag 5. Secondary Dressing Applied Gauze and Kerlix/Conform 7. Secured with Recruitment consultant) Signed: 06/12/2016 4:39:21 PM By: Montey Hora Entered By: Montey Hora on 06/12/2016  09:29:35 Ramella, Estera L. ([id number]) -------------------------------------------------------------------------------- Wound Assessment Details Patient Name: Bagot, Jendaya L. Date of Service: 06/12/2016 9:15 AM Medical Record Patient Account Number: [id number] Number: Treating RN: Montey Hora [birth year] (70 y.o. Other Clinician: Date of Birth/Sex: Female) Treating ROBSON, MICHAEL Primary Care Physician/Extender: Ashok Croon Physician: Referring  Physician: Brendia Sacks in Treatment: 1 Wound Status Wound Number: 3 Primary Etiology: Pressure Ulcer Wound Location: Left Foot - Lateral Secondary Diabetic Wound/Ulcer of the Lower Etiology: Extremity Wounding Event: Blister Wound Status: Open Date Acquired: 06/03/2016 Comorbid Hypertension, Type II Diabetes Weeks Of Treatment: 1 History: Clustered Wound: No Photos Wound Measurements Length: (cm) 0.3 Width: (cm) 0.7 Depth: (cm) 0.1 Area: (cm) 0.165 Volume: (cm) 0.016 % Reduction in Area: 76.7% % Reduction in Volume: 77.5% Epithelialization: None Tunneling: No Undermining: No Wound Description Classification: Category/Stage I Diabetic Severity Earleen Newport): Grade 1 Wound Margin: Flat and Intact Exudate Amount: Small Exudate Type: Serous Exudate Color: amber Wound Bed Granulation Amount: None Present (0%) Exposed Structure Necrotic Amount: None Present (0%) Fascia Exposed: No Filo, Lorece L. ([id number]) Fat Layer Exposed: No Tendon Exposed: No Muscle Exposed: No Joint Exposed: No Bone Exposed: No Limited to Skin Breakdown Periwound Skin Texture Texture Color No Abnormalities Noted: No No Abnormalities Noted: No Moisture No Abnormalities Noted: No Maceration: Yes Wound Preparation Ulcer Cleansing: Rinsed/Irrigated with Saline Topical Anesthetic Applied: Other: lidocaine 4%, Treatment Notes Wound #3 (Left, Lateral Foot) 1. Cleansed with: Clean wound with Normal Saline 2.  Anesthetic Topical Lidocaine 4% cream to wound bed prior to debridement 4. Dressing Applied: Aquacel Ag 5. Secondary Dressing Applied Gauze and Kerlix/Conform 7. Secured with Recruitment consultant) Signed: 06/12/2016 4:39:21 PM By: Montey Hora Entered By: Montey Hora on 06/12/2016 09:30:16 [place], [name] ([id number]) -------------------------------------------------------------------------------- Vitals Details Patient Name: Ahuja, Mariesha L. Date of Service: 06/12/2016 9:15 AM Medical Record Patient Account Number: [id number] Number: Treating RN: Montey Hora 10-25-1945 (70 y.o. Other Clinician: Date of Birth/Sex: Female) Treating ROBSON, MICHAEL Primary Care Physician/Extender: Estanislado Pandy, Cecille Rubin Physician: Referring Physician: Brendia Sacks in Treatment: 1 Vital Signs Time Taken: 09:22 Temperature (F): 97.7 Height (in): 62 Pulse (bpm): 80 Weight (lbs): 140 Respiratory Rate (breaths/min): 16 Body Mass Index (BMI): 25.6 Blood Pressure (mmHg): 196/73 Reference Range: 80 - 120 mg / dl Electronic Signature(s) Signed: 06/12/2016 4:39:21 PM By: Montey Hora Entered By: Montey Hora on 06/12/2016 95:32:02

## 2016-06-13 NOTE — Progress Notes (Signed)
[name], [name] ([id number]) Visit Report for 06/12/2016 Chief Complaint Document Details Patient Name: [name], [name] Date of Service: 06/12/2016 9:15 AM Medical Record Patient Account Number: [id number] Number: Treating RN: Montey Hora Apr 23, 1946 (70 y.o. Other Clinician: Date of Birth/Sex: Female) Treating Destynee Stringfellow Primary Care Physician/Extender: Ashok Croon Physician: Referring Physician: Brendia Sacks in Treatment: 1 Information Obtained from: Patient Chief Complaint 06/05/16; patient is here for review of a wound on her right foot which is been present for at least a month Electronic Signature(s) Signed: 06/12/2016 4:39:43 PM By: Linton Ham MD Entered By: Linton Ham on 06/12/2016 11:14:40 [place], [place] ([id number]) -------------------------------------------------------------------------------- Debridement Details Patient Name: [name], [name] Date of Service: 06/12/2016 9:15 AM Medical Record Patient Account Number: [id number] Number: Treating RN: Montey Hora [birth year] (70 y.o. Other Clinician: Date of Birth/Sex: Female) Treating Elesa Garman Primary Care Physician/Extender: Estanislado Pandy, Cecille Rubin Physician: Referring Physician: Brendia Sacks in Treatment: 1 Debridement Performed for Wound #2 Left,Plantar Foot Assessment: Performed By: Physician Ricard Dillon, MD Debridement: Debridement Pre-procedure Yes - 10:14 Verification/Time Out Taken: Start Time: 10:14 Pain Control: Lidocaine 4% Topical Solution Level: Skin/Subcutaneous Tissue Total Area Debrided (L x 1 (cm) x 1 (cm) = 1 (cm) W): Tissue and other Viable, Non-Viable, Callus, Fibrin/Slough, Subcutaneous material debrided: Instrument: Blade, Curette, Forceps Bleeding: Moderate Hemostasis Achieved: Silver Nitrate End Time: 10:18 Procedural Pain: 0 Post Procedural Pain: 0 Response to Treatment: Procedure was tolerated well Post Debridement  Measurements of Total Wound Length: (cm) 1 Width: (cm) 1 Depth: (cm) 0.4 Volume: (cm) 0.314 Character of Wound/Ulcer Post Improved Debridement: Severity of Tissue Post Debridement: Fat layer exposed Post Procedure Diagnosis Same as Pre-procedure Electronic Signature(s) Signed: 06/12/2016 4:39:21 PM By: Elvina Mattes, [name] ([id number]) Signed: 06/12/2016 4:39:43 PM By: Linton Ham MD Entered By: Linton Ham on 06/12/2016 11:14:20 [place], [place] ([id number]) -------------------------------------------------------------------------------- HPI Details Patient Name: [name], [name] Date of Service: 06/12/2016 9:15 AM Medical Record Patient Account Number: [id number] Number: Treating RN: Montey Hora 01-22-1946 (70 y.o. Other Clinician: Date of Birth/Sex: Female) Treating Maedell Hedger Primary Care Physician/Extender: Estanislado Pandy, Cecille Rubin Physician: Referring Physician: Brendia Sacks in Treatment: 1 History of Present Illness HPI Description: The patient is here for followup. Denies fevers, has been compliant with wearing Darco shoes. She states that her blood sugars are still in the low 200s. Angellina is a 33F with a h/o DM who presents with an open ulcer on the plantar aspect of her L great toe. She has had it for close to 8 months. She has also had several debridements as well as courses of oral antibiotics. Currently, she denies fevers and has no pain. She has never had ulcers like this in the past. Her blood sugars are normally in the 170s. Foot xray was negative for osteomyelitis. HgbA1c was 10.3. READMISSION; 06/05/16; [name] is a 70 year old type II diabetic by description not well-controlled. She does not have known PAD or claudication. She has apparently some degree of neuropathy or she's been told that in the past. She has a history of a wound on the plantar left foot foot for which she was seen here in 2015 although I was not  involved in her care nor were any other current physicians working in our clinics. She was managed with standard dressings and a Darco forefoot offloading boot and she seems to have healed quite quickly. Apparently the patient noticed a callus/wound development a month ago although her daughter who is  present states it was longer than that. Indeed the patient seems to been followed for at least 3 clinic visits with her podiatrist Dr. Gardiner Barefoot. On October 19 noted a necrotic ulcer on the plantar aspect of the left first metatarsal head. She was using Silvadene based dressings given Keflex on 10/9. I don't believe any cultures or x-rays were done. More importantly she doesn't seem to be adequately offloading this area in her footwear ABIs in this clinic were 0.88 bilaterally. 06/12/16; continued follow-up for a central wound over the left first metatarsal head. Once again today required an extensive debridement. She is using a Darco forefoot offloading boot. Her x-ray of the foot was negative for any bone changes. She has not yet had vascular studies. ABI in this clinic was 0.88 Electronic Signature(s) Signed: 06/12/2016 4:39:43 PM By: Linton Ham MD Entered By: Linton Ham on 06/12/2016 11:20:14 [place], [place] ([id number]) [place], [place] ([id number]) -------------------------------------------------------------------------------- Physical Exam Details Patient Name: [name], [name] Date of Service: 06/12/2016 9:15 AM Medical Record Patient Account Number: [id number] Number: Treating RN: Montey Hora October 03, 1945 (70 y.o. Other Clinician: Date of Birth/Sex: Female) Treating Gagandeep Kossman Primary Care Physician/Extender: Estanislado Pandy, Cecille Rubin Physician: Referring Physician: Brendia Sacks in Treatment: 1 Constitutional Patient is hypertensive.. Pulse regular and within target range for patient.Marland Kitchen Respirations regular, non-labored and within target range..  Temperature is normal and within the target range for the patient.. No change in height.. Cardiovascular Salas pedis and posterior tibial pulses are palpable. Notes Wound exam; continued follow-up of the wound over the plantar aspect of the first metatarsal head. This initially presented as a quarter-sized wound however with extensive surrounding callus and nonviable subcutaneous tissue. I debrided part of this last week and continue the process this week. Using pickups and scalpel extensive debridement of nonviable skin subcutaneous tissue with some callus. I believe I have unroofed the entire wound area at this point. There is no palpable bone here. No clear evidence of infection. Her procedure was tolerated well Electronic Signature(s) Signed: 06/12/2016 4:39:43 PM By: Linton Ham MD Entered By: Linton Ham on 06/12/2016 11:22:23 Tiu, [name] ([id number]) -------------------------------------------------------------------------------- Physician Orders Details Patient Name: Kimmons, Orvella L. Date of Service: 06/12/2016 9:15 AM Medical Record Patient Account Number: [id number] Number: Treating RN: Montey Hora 18-Mar-1946 (70 y.o. Other Clinician: Date of Birth/Sex: Female) Treating Cristen Murcia Primary Care Physician/Extender: Estanislado Pandy, Cecille Rubin Physician: Referring Physician: Brendia Sacks in Treatment: 1 Verbal / Phone Orders: Yes Clinician: Dorthy, Joanna Read Back and Verified: Yes Diagnosis Coding Wound Cleansing Wound #2 Oak wound with Normal Saline. Wound #3 Left,Lateral Foot o Clean wound with Normal Saline. Anesthetic Wound #2 Left,Plantar Foot o Topical Lidocaine 4% cream applied to wound bed prior to debridement Wound #3 Left,Lateral Foot o Topical Lidocaine 4% cream applied to wound bed prior to debridement Primary Wound Dressing Wound #2 Left,Plantar Foot o Aquacel Ag Wound #3 Left,Lateral Foot o  Aquacel Ag Secondary Dressing Wound #2 Left,Plantar Foot o ABD and Kerlix/Conform Wound #3 Left,Lateral Foot o ABD and Kerlix/Conform Dressing Change Frequency Wound #2 Left,Plantar Foot o Change dressing every other day. Wound #3 Left,Lateral Foot Siebenaler, Jovonna L. ([id number]) o Change dressing every other day. Follow-up Appointments Wound #2 [place] o Return Appointment in 1 week. Wound #3 Left,Lateral Foot o Return Appointment in 1 week. Off-Loading Wound #2 Left,Plantar Foot o Other: - front-offloader Wound #3 Left,Lateral Foot o Other: - front-offloader Additional Orders / Instructions Wound #2 Left,Plantar  Foot o Increase protein intake. Wound #3 Left,Lateral Foot o Increase protein intake. Electronic Signature(s) Signed: 06/12/2016 4:39:21 PM By: Montey Hora Signed: 06/12/2016 4:39:43 PM By: Linton Ham MD Entered By: Montey Hora on 06/12/2016 10:20:16 Behar, Kiaria L. ([id number]) -------------------------------------------------------------------------------- Problem List Details Patient Name: Carte, Meshia L. Date of Service: 06/12/2016 9:15 AM Medical Record Patient Account Number: [id number] Number: Treating RN: Montey Hora 05-22-1946 (70 y.o. 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 Date of Service: 06/12/2016 9:15 AM Medical Record Patient Account Number: [id number] Number: Treating RN: Montey Hora 1945/09/16 (70 y.o. Other Clinician: Date of Birth/Sex: Female) Treating Eunique Balik Primary Care Physician/Extender: Ashok Croon Physician: Referring Physician: Brendia Sacks in Treatment: 1 Subjective Chief Complaint Information obtained from Patient 06/05/16; patient is here for review of a wound on her right foot which is been present for at least a month History of Present Illness (HPI) The patient is here for followup. Denies fevers, has been compliant with wearing Darco shoes. She states that her blood sugars are still in the low 200s. Terianna is a 58F with a h/o DM who presents with an open ulcer on the plantar aspect of her L great toe. She has had it for close to 8 months. She has also had several debridements as well as courses of oral antibiotics. Currently, she denies fevers and has no pain. She has never had ulcers like this in the past. Her blood sugars are normally in the 170s. Foot xray was negative for osteomyelitis. HgbA1c was 10.3. READMISSION; 06/05/16; Mrs. Caputo is a 70 year old type II diabetic by description not well-controlled. She does not have known PAD or claudication. She has apparently some degree of neuropathy or she's been told that in the past. She has a history of a wound on the plantar left foot foot for which she was seen here in 2015 although I was not involved in her care nor were any other current physicians working in our clinics. She was managed with standard dressings and a Darco forefoot offloading boot and she seems to have healed quite quickly. Apparently the patient noticed a callus/wound development a month ago although her daughter who is present  states it was longer than that. Indeed the patient seems to been followed for at least 3 clinic visits with her podiatrist Dr. Gardiner Barefoot. On October 19 noted a necrotic ulcer on the plantar aspect of the left first metatarsal head. She was using Silvadene based dressings given Keflex on 10/9. I don't believe any cultures or x-rays were done. More importantly she doesn't seem to be adequately offloading this area in her footwear ABIs in this clinic were 0.88 bilaterally. 06/12/16; continued follow-up for a central wound over the left first metatarsal head. Once again today required an extensive debridement. She is using a Darco forefoot offloading boot. Her x-ray of the foot was negative for any bone changes. She has not yet had vascular studies. ABI in this clinic was 0.88 Gad, Ambar  L. ([id number]) Objective Constitutional Patient is hypertensive.. Pulse regular and within target range for patient.Marland Kitchen Respirations regular, non-labored and within target range.. Temperature is normal and within the target range for the patient.. No change in height.. Vitals Time Taken: 9:22 AM, Height: 62 in, Weight: 140 lbs, BMI: 25.6, Temperature: 97.7 F, Pulse: 80 bpm, Respiratory Rate: 16 breaths/min, Blood Pressure: 196/73 mmHg. Cardiovascular Salas pedis and posterior tibial pulses are palpable. General Notes: Wound exam; continued follow-up of the wound over the plantar aspect of the first metatarsal head. This initially presented as a quarter-sized wound however with extensive surrounding callus and nonviable subcutaneous tissue. I debrided part of this last week and continue the process this week. Using pickups and scalpel extensive debridement of nonviable skin subcutaneous tissue with some callus. I believe I have unroofed the entire wound area at this point. There is no palpable bone here. No clear evidence of infection. Her procedure was tolerated well Integumentary (Hair, Skin) Wound #2  status is Open. Original cause of wound was Gradually Appeared. The wound is located on the [place]. The wound measures 1cm length x 1cm width x 0.4cm depth; 0.785cm^2 area and 0.314cm^3 volume. There is fat exposed. There is no tunneling or undermining noted. There is a large amount of serous drainage noted. The wound margin is epibole. There is large (67-100%) red granulation within the wound bed. There is a small (1-33%) amount of necrotic tissue within the wound bed including Eschar. The periwound skin appearance exhibited: Callus. The periwound skin appearance did not exhibit: Crepitus, Excoriation, Fluctuance, Friable, Induration, Localized Edema, Rash, Scarring, Dry/Scaly, Maceration, Moist, Atrophie Blanche, Cyanosis, Ecchymosis, Hemosiderin Staining, Mottled, Pallor, Rubor, Erythema. Periwound temperature was noted as No Abnormality. Wound #3 status is Open. Original cause of wound was Blister. The wound is located on the Left,Lateral Foot. The wound measures 0.3cm length x 0.7cm width x 0.1cm depth; 0.165cm^2 area and 0.016cm^3 volume. The wound is limited to skin breakdown. There is no tunneling or undermining noted. There is a small amount of serous drainage noted. The wound margin is flat and intact. There is no granulation within the wound bed. There is no necrotic tissue within the wound bed. The periwound skin appearance exhibited: Maceration. [place], Everly L. ([id number]) Assessment Active Problems ICD-10 E11.621 - Type 2 diabetes mellitus with foot ulcer L97.522 - Non-pressure chronic ulcer of other part of left foot with fat layer exposed Procedures Wound #2 Wound #2 is a Diabetic Wound/Ulcer of the Lower Extremity located on the Left,Plantar Foot . There was a Skin/Subcutaneous Tissue Debridement ([id number]) debridement with total area of 1 sq cm performed by Ricard Dillon, MD. with the following instrument(s): Blade, Curette, and Forceps to remove Viable  and Non-Viable tissue/material including Fibrin/Slough, Callus, and Subcutaneous after achieving pain control using Lidocaine 4% Topical Solution. A time out was conducted at 10:14, prior to the start of the procedure. A Moderate amount of bleeding was controlled with Silver Nitrate. The procedure was tolerated well with a pain level of 0 throughout and a pain level of 0 following the procedure. Post Debridement Measurements: 1cm length x 1cm width x 0.4cm depth; 0.314cm^3 volume. Character of Wound/Ulcer Post Debridement is improved. Severity of Tissue Post Debridement is: Fat layer exposed. Post procedure Diagnosis Wound #2: Same as Pre-Procedure Plan Wound Cleansing: Wound #2 Left,Plantar Foot: Clean wound with Normal Saline. Wound #3 Left,Lateral Foot: Clean wound with Normal Saline. Anesthetic: Wound #2 Left,Plantar Foot: Topical Lidocaine 4% cream applied to wound bed prior  to debridement Wound #3 Left,Lateral Foot: Topical Lidocaine 4% cream applied to wound bed prior to debridement Primary Wound Dressing: Wound #2 Left,Plantar Foot: Aquacel Ag Wound #3 Left,Lateral Foot: [place]agoso, Jocie L. ([id number]) Aquacel Ag Secondary Dressing: Wound #2 Left,Plantar Foot: ABD and Kerlix/Conform Wound #3 Left,Lateral Foot: ABD and Kerlix/Conform Dressing Change Frequency: Wound #2 Left,Plantar Foot: Change dressing every other day. Wound #3 Left,Lateral Foot: Change dressing every other day. Follow-up Appointments: Wound #2 Left,Plantar Foot: Return Appointment in 1 week. Wound #3 Left,Lateral Foot: Return Appointment in 1 week. Off-Loading: Wound #2 Left,Plantar Foot: Other: - front-offloader Wound #3 Left,Lateral Foot: Other: - front-offloader Additional Orders / Instructions: Wound #2 Left,Plantar Foot: Increase protein intake. Wound #3 Left,Lateral Foot: Increase protein intake. o #1 extensive debridement done with pickups and scalpel was noted. Post debridement the  base of this looks better. The area on the left lateral fifth metatarsal head looks smaller as well. #2 continued with Aquacel Ag to both wound areas. #3 extensive discussion today about the need for the arterial studies generated by the patient's daughter who has difficulty getting off work. I told him I didn't think this needed to be done this week however I would like it to be done and then the next week or 2 #4 I think we can move to a total contact cast if this stalls look towards a week or 2 [place], Aretha L. ([id number]) Electronic Signature(s) Signed: 06/12/2016 4:39:43 PM By: Linton Ham MD Entered By: Linton Ham on 06/12/2016 11:25:01 [place], [place] ([id number]) -------------------------------------------------------------------------------- SuperBill Details Patient Name: Slowey, Dashay L. Date of Service: 06/12/2016 Medical Record Patient Account Number: [id number] Number: Treating RN: Montey Hora 04-27-1946 (70 y.o. Other Clinician: Date of Birth/Sex: Female) Treating Edell Mesenbrink Primary Care Physician/Extender: Ashok Croon Physician: Weeks in Treatment: 1 Referring Physician: Lanier Prude Diagnosis Coding ICD-10 Codes Code Description E11.621 Type 2 diabetes mellitus with foot ulcer L97.522 Non-pressure chronic ulcer of other part of left foot with fat layer exposed Facility Procedures CPT4 Code Description: [id number] - DEB SUBQ TISSUE 20 SQ CM/< ICD-10 Description Diagnosis L97.522 Non-pressure chronic ulcer of other part of left foot E11.621 Type 2 diabetes mellitus with foot ulcer Modifier: with fat lay Quantity: 1 er exposed Physician Procedures CPT4 Code Description: [id number] - WC PHYS SUBQ TISS 20 SQ CM ICD-10 Description Diagnosis L97.522 Non-pressure chronic ulcer of other part of left foot E11.621 Type 2 diabetes mellitus with foot ulcer Modifier: with fat laye Quantity: 1 r exposed Electronic Signature(s) Signed:  06/12/2016 4:39:43 PM By: Linton Ham MD Entered By: Linton Ham on 06/12/2016 11:25:26

## 2016-06-14 ENCOUNTER — Ambulatory Visit: Payer: Medicare Other | Admitting: Cardiovascular Disease

## 2016-06-19 ENCOUNTER — Encounter: Payer: Medicare Other | Admitting: Internal Medicine

## 2016-06-19 DIAGNOSIS — E11621 Type 2 diabetes mellitus with foot ulcer: Secondary | ICD-10-CM | POA: Diagnosis not present

## 2016-06-20 NOTE — Progress Notes (Signed)
[name], [name] ([id number]) Visit Report for 06/19/2016 Chief Complaint Document Details Patient Name: [name], [name] Date of Service: 06/19/2016 10:00 AM Medical Record Patient Account Number: [id number] Number: Treating RN: Montey Hora 25-Jan-1946 (70 y.o. Other Clinician: Date of Birth/Sex: Female) Treating Roel Douthat Primary Care Physician/Extender: Ashok Croon Physician: Referring Physician: Brendia Sacks in Treatment: 2 Information Obtained from: Patient Chief Complaint 06/05/16; patient is here for review of a wound on her right foot which is been present for at least a month Electronic Signature(s) Signed: 06/19/2016 4:39:00 PM By: Linton Ham MD Entered By: Linton Ham on 06/19/2016 10:54:44 [place], [place] ([id number]) -------------------------------------------------------------------------------- Debridement Details Patient Name: [name], [name] Date of Service: 06/19/2016 10:00 AM Medical Record Patient Account Number: [id number] Number: Treating RN: Montey Hora 06-07-1946 (70 y.o. Other Clinician: Date of Birth/Sex: Female) Treating Xiana Carns Primary Care Physician/Extender: Estanislado Pandy, Cecille Rubin Physician: Referring Physician: Brendia Sacks in Treatment: 2 Debridement Performed for Wound #2 Left,Plantar Foot Assessment: Performed By: Physician Ricard Dillon, MD Debridement: Debridement Pre-procedure Yes - 10:31 Verification/Time Out Taken: Start Time: 10:31 Pain Control: Lidocaine 4% Topical Solution Level: Skin/Subcutaneous Tissue Total Area Debrided (L x 1.1 (cm) x 1.1 (cm) = 1.21 (cm) W): Tissue and other Viable, Non-Viable, Fibrin/Slough, Subcutaneous material debrided: Instrument: Curette Bleeding: Minimum Hemostasis Achieved: Pressure End Time: 10:35 Procedural Pain: 0 Post Procedural Pain: 0 Response to Treatment: Procedure was tolerated well Post Debridement Measurements of  Total Wound Length: (cm) 1.1 Width: (cm) 1.1 Depth: (cm) 0.3 Volume: (cm) 0.285 Character of Wound/Ulcer Post Improved Debridement: Severity of Tissue Post Debridement: Fat layer exposed Post Procedure Diagnosis Same as Pre-procedure Electronic Signature(s) Signed: 06/19/2016 4:39:00 PM By: Linton Ham MD Nohr, [name] ([id number]) Signed: 06/19/2016 6:00:24 PM By: Montey Hora Entered By: Linton Ham on 06/19/2016 10:53:49 [place], [place] ([id number]) -------------------------------------------------------------------------------- HPI Details Patient Name: [name], [name] Date of Service: 06/19/2016 10:00 AM Medical Record Patient Account Number: [id number] Number: Treating RN: Montey Hora [birth year] (70 y.o. Other Clinician: Date of Birth/Sex: Female) Treating Nichlas Pitera Primary Care Physician/Extender: Estanislado Pandy, Cecille Rubin Physician: Referring Physician: Brendia Sacks in Treatment: 2 History of Present Illness HPI Description: The patient is here for followup. Denies fevers, has been compliant with wearing Darco shoes. She states that her blood sugars are still in the low 200s. [name] is a 28F with a h/o DM who presents with an open ulcer on the plantar aspect of her L great toe. She has had it for close to 8 months. She has also had several debridements as well as courses of oral antibiotics. Currently, she denies fevers and has no pain. She has never had ulcers like this in the past. Her blood sugars are normally in the 170s. Foot xray was negative for osteomyelitis. HgbA1c was 10.3. READMISSION; 06/05/16; [name] is a 70 year old type II diabetic by description not well-controlled. She does not have known PAD or claudication. She has apparently some degree of neuropathy or she's been told that in the past. She has a history of a wound on the plantar left foot foot for which she was seen here in 2015 although I was not involved in  her care nor were any other current physicians working in our clinics. She was managed with standard dressings and a Darco forefoot offloading boot and she seems to have healed quite quickly. Apparently the patient noticed a callus/wound development a month ago although her daughter who is present states it was  longer than that. Indeed the patient seems to been followed for at least 3 clinic visits with her podiatrist Dr. Gardiner Barefoot. On October 19 noted a necrotic ulcer on the plantar aspect of the left first metatarsal head. She was using Silvadene based dressings given Keflex on 10/9. I don't believe any cultures or x-rays were done. More importantly she doesn't seem to be adequately offloading this area in her footwear ABIs in this clinic were 0.88 bilaterally. 06/12/16; continued follow-up for a central wound over the left first metatarsal head. Once again today required an extensive debridement. She is using a Darco forefoot offloading boot. Her x-ray of the foot was negative for any bone changes. She has not yet had vascular studies. ABI in this clinic was 0.88 06/19/16; formal arterial studies for early January for her daughter. In general the major wound over the plantar left first metatarsal head looks better. Area on her lateral fifth metatarsal head looks like it's proceeding towards closure. She is wearing the Darco forefoot offloading sandal was some difficulty Electronic Signature(s) GURNEET, MATARESE ([id number]) Signed: 06/19/2016 4:39:00 PM By: Linton Ham MD Entered By: Linton Ham on 06/19/2016 10:56:01 [place], [place] L. ([id number]) -------------------------------------------------------------------------------- Physical Exam Details Patient Name: [name], [name] Date of Service: 06/19/2016 10:00 AM Medical Record Patient Account Number: [id number] Number: Treating RN: Montey Hora [birth year] (70 y.o. Other Clinician: Date of Birth/Sex: Female)  Treating Susie Pousson Primary Care Physician/Extender: Estanislado Pandy, Cecille Rubin Physician: Referring Physician: Brendia Sacks in Treatment: 2 Constitutional Patient is hypertensive.. Pulse regular and within target range for patient.Marland Kitchen Respirations regular, non-labored and within target range.. Temperature is normal and within the target range for the patient.. Patient's appearance is neat and clean. Appears in no acute distress. Well nourished and well developed.. Cardiovascular Pedal pulses palpable and strong bilaterally.. Notes Wound exam; plantar aspect of the first metatarsal head. Using a #3 curette debridement of surface slough nonviable circumferential subcutaneous tissue. She tolerates this quite well. Hemostasis with direct pressure. No evidence of surrounding infection oOn the same side the lateral aspect of the fifth metatarsal head looks as though it is closing over. oNo evidence of infection in either wound site. Her is no palpable bone in the plantar first metatarsal head Electronic Signature(s) Signed: 06/19/2016 4:39:00 PM By: Linton Ham MD Entered By: Linton Ham on 06/19/2016 10:57:57 [place], [place] ([id number]) -------------------------------------------------------------------------------- Physician Orders Details Patient Name: [name], [name] Date of Service: 06/19/2016 10:00 AM Medical Record Patient Account Number: [id number] Number: Treating RN: Montey Hora Nov 25, 1945 (70 y.o. Other Clinician: Date of Birth/Sex: Female) Treating Ardena Gangl Primary Care Physician/Extender: Estanislado Pandy, Cecille Rubin Physician: Referring Physician: Brendia Sacks in Treatment: 2 Verbal / Phone Orders: Yes Clinician: Dorthy, Joanna Read Back and Verified: Yes Diagnosis Coding Wound Cleansing Wound #2 [place] wound with Normal Saline. Wound #3 Left,Lateral Foot o Clean wound with Normal Saline. Anesthetic Wound #2 Left,Plantar  Foot o Topical Lidocaine 4% cream applied to wound bed prior to debridement Wound #3 Left,Lateral Foot o Topical Lidocaine 4% cream applied to wound bed prior to debridement Primary Wound Dressing Wound #2 Left,Plantar Foot o Aquacel Ag Wound #3 Left,Lateral Foot o Aquacel Ag Secondary Dressing Wound #2 Left,Plantar Foot o ABD and Kerlix/Conform - secure lightly with coban Wound #3 Left,Lateral Foot o ABD and Kerlix/Conform - secure lightly with coban Dressing Change Frequency Wound #2 Left,Plantar Foot o Change dressing every other day. Wound #3 Left,Lateral Foot [name], [name] ([id number]) o Change  dressing every other day. Follow-up Appointments Wound #2 [place] o Return Appointment in 1 week. Wound #3 Left,Lateral Foot o Return Appointment in 1 week. Off-Loading Wound #2 Left,Plantar Foot o Other: - front-offloader Wound #3 Left,Lateral Foot o Other: - front-offloader Additional Orders / Instructions Wound #2 Left,Plantar Foot o Increase protein intake. Wound #3 Left,Lateral Foot o Increase protein intake. Electronic Signature(s) Signed: 06/19/2016 4:39:00 PM By: Linton Ham MD Signed: 06/19/2016 6:00:24 PM By: Montey Hora Entered By: Montey Hora on 06/19/2016 10:36:16 [name], [name] ([id number]) -------------------------------------------------------------------------------- Problem List Details Patient Name: [name], [name] Date of Service: 06/19/2016 10:00 AM Medical Record Patient Account Number: [id number] Number: Treating RN: Montey Hora 08-03-1945 (70 y.o. 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 Date of Service: 06/19/2016 10:00 AM Medical Record Patient Account Number: [id number] Number: Treating RN: Montey Hora 05/16/1946 (70 y.o. Other Clinician: Date of Birth/Sex: Female) Treating Aubriee Szeto Primary Care Physician/Extender: Ashok Croon Physician: Referring Physician: Brendia Sacks in Treatment: 2 Subjective Chief Complaint Information obtained from Patient 06/05/16; patient is here for review of a wound on her right foot which is been present for at least a month History of Present Illness (HPI) The patient is here for followup. Denies fevers, has been compliant with wearing Darco shoes. She states that her blood sugars are still in the low 200s. Myrlene is a 40F with a h/o DM who presents with an open ulcer on the plantar aspect of her L great toe. She has had it for close to 8 months. She has also had several debridements as well as courses of oral antibiotics. Currently, she denies fevers and has no pain. She has never had ulcers like this in the past. Her blood sugars are normally in the 170s. Foot xray was negative for osteomyelitis. HgbA1c was 10.3. READMISSION; 06/05/16; [name] is a 70 year old type II diabetic by description not well-controlled. She does not have known PAD or claudication. She has apparently some degree of neuropathy or she's been told that in the past. She has a history of a wound on the plantar left foot foot for which she was seen here in 2015 although I was not involved in her care nor were any  other current physicians working in our clinics. She was managed with standard dressings and a Darco forefoot offloading boot and she seems to have healed quite quickly. Apparently the patient noticed a callus/wound development a month ago although her daughter who is present states it was longer than that. Indeed the patient seems to been followed for at least 3 clinic visits with her podiatrist Dr. Gardiner Barefoot. On October 19 noted a necrotic ulcer on the plantar aspect of the left first metatarsal head. She was using Silvadene based dressings given Keflex on 10/9. I don't believe any cultures or x-rays were done. More importantly she doesn't seem to be adequately offloading this area in her footwear ABIs in this clinic were 0.88 bilaterally. 06/12/16; continued follow-up for  a central wound over the left first metatarsal head. Once again today required an extensive debridement. She is using a Darco forefoot offloading boot. Her x-ray of the foot was negative for any bone changes. She has not yet had vascular studies. ABI in this clinic was 0.88 [name], [place]. ([id number]) 06/19/16; formal arterial studies for early January for her daughter. In general the major wound over the plantar left first metatarsal head looks better. Area on her lateral fifth metatarsal head looks like it's proceeding towards closure. She is wearing the Darco forefoot offloading sandal was some difficulty Objective Constitutional Patient is hypertensive.. Pulse regular and within target range for patient.Marland Kitchen Respirations regular, non-labored and within target range.. Temperature is normal and within the target range for the patient.. Patient's appearance is neat and clean. Appears in no acute distress. Well nourished and well developed.. Vitals Time Taken: 10:16 AM, Height: 62 in, Weight: 140 lbs, BMI: 25.6, Temperature: 98.1 F, Pulse: 84 bpm, Respiratory Rate: 16 breaths/min, Blood Pressure: 178/84  mmHg. Cardiovascular Pedal pulses palpable and strong bilaterally.. General Notes: Wound exam; plantar aspect of the first metatarsal head. Using a #3 curette debridement of surface slough nonviable circumferential subcutaneous tissue. She tolerates this quite well. Hemostasis with direct pressure. No evidence of surrounding infection On the same side the lateral aspect of the fifth metatarsal head looks as though it is closing over. No evidence of infection in either wound site. Her is no palpable bone in the plantar first metatarsal head Integumentary (Hair, Skin) Wound #2 status is Open. Original cause of wound was Gradually Appeared. The wound is located on the [place]. The wound measures 1.1cm length x 1.1cm width x 0.2cm depth; 0.95cm^2 area and 0.19cm^3 volume. There is fat exposed. There is no tunneling or undermining noted. There is a large amount of serous drainage noted. The wound margin is epibole. There is large (67-100%) red granulation within the wound bed. There is a small (1-33%) amount of necrotic tissue within the wound bed including Eschar. The periwound skin appearance exhibited: Callus. The periwound skin appearance did not exhibit: Crepitus, Excoriation, Fluctuance, Friable, Induration, Localized Edema, Rash, Scarring, Dry/Scaly, Maceration, Moist, Atrophie Blanche, Cyanosis, Ecchymosis, Hemosiderin Staining, Mottled, Pallor, Rubor, Erythema. Periwound temperature was noted as No Abnormality. Wound #3 status is Open. Original cause of wound was Blister. The wound is located on the Left,Lateral Foot. The wound measures 0.1cm length x 0.1cm width x 0.1cm depth; 0.008cm^2 area and 0.001cm^3 volume. The wound is limited to skin breakdown. There is no tunneling or undermining noted. There is a small amount of serous drainage noted. The wound margin is flat and intact. There is no granulation within the wound bed. There is no necrotic tissue within the wound bed. The  periwound skin appearance exhibited: Maceration. [name], [name] ([id number]) Assessment Active Problems ICD-10 E11.621 - Type 2 diabetes mellitus with foot ulcer L97.522 - Non-pressure chronic ulcer of other part of left foot with fat layer exposed Procedures Wound #2 Wound #2 is a Diabetic Wound/Ulcer of the Lower Extremity located on the Left,Plantar Foot . There was a Skin/Subcutaneous Tissue Debridement ([id number]) debridement with total area of 1.21 sq cm performed by Ricard Dillon, MD. with the following instrument(s): Curette to remove Viable and Non-Viable tissue/material including Fibrin/Slough and Subcutaneous after achieving pain control using Lidocaine 4% Topical Solution. A time out was conducted at 10:31, prior to the start of the procedure. A Minimum amount of bleeding was controlled with Pressure. The procedure was tolerated  well with a pain level of 0 throughout and a pain level of 0 following the procedure. Post Debridement Measurements: 1.1cm length x 1.1cm width x 0.3cm depth; 0.285cm^3 volume. Character of Wound/Ulcer Post Debridement is improved. Severity of Tissue Post Debridement is: Fat layer exposed. Post procedure Diagnosis Wound #2: Same as Pre-Procedure Plan Wound Cleansing: Wound #2 Left,Plantar Foot: Clean wound with Normal Saline. Wound #3 Left,Lateral Foot: Clean wound with Normal Saline. Anesthetic: Wound #2 Left,Plantar Foot: Topical Lidocaine 4% cream applied to wound bed prior to debridement Wound #3 Left,Lateral Foot: Topical Lidocaine 4% cream applied to wound bed prior to debridement Primary Wound Dressing: Wound #2 Left,Plantar Foot: [place], [place] L. ([id number]) Aquacel Ag Wound #3 Left,Lateral Foot: Aquacel Ag Secondary Dressing: Wound #2 Left,Plantar Foot: ABD and Kerlix/Conform - secure lightly with coban Wound #3 Left,Lateral Foot: ABD and Kerlix/Conform - secure lightly with coban Dressing Change Frequency: Wound #2  Left,Plantar Foot: Change dressing every other day. Wound #3 Left,Lateral Foot: Change dressing every other day. Follow-up Appointments: Wound #2 Left,Plantar Foot: Return Appointment in 1 week. Wound #3 Left,Lateral Foot: Return Appointment in 1 week. Off-Loading: Wound #2 Left,Plantar Foot: Other: - front-offloader Wound #3 Left,Lateral Foot: Other: - front-offloader Additional Orders / Instructions: Wound #2 Left,Plantar Foot: Increase protein intake. Wound #3 Left,Lateral Foot: Increase protein intake. continue with Aquacel AG for both areas. we spent sometime talking about offloading. she seems more stable on the forefoot offloader. consider TCC if this stalls. Electronic Signature(s) Signed: 06/19/2016 4:39:00 PM By: Linton Ham MD Entered By: Linton Ham on 06/19/2016 11:00:15 [place], [place] ([id number]) -------------------------------------------------------------------------------- SuperBill Details Patient Name: Koeneman, Mayelin L. Date of Service: 06/19/2016 Medical Record Patient Account Number: [id number] Number: Treating RN: Montey Hora 01-19-1946 (70 y.o. Other Clinician: Date of Birth/Sex: Female) Treating Catrinia Racicot Primary Care Physician/Extender: Ashok Croon Physician: Weeks in Treatment: 2 Referring Physician: Lanier Prude Diagnosis Coding ICD-10 Codes Code Description E11.621 Type 2 diabetes mellitus with foot ulcer L97.522 Non-pressure chronic ulcer of other part of left foot with fat layer exposed Facility Procedures CPT4 Code Description: [id number] - DEB SUBQ TISSUE 20 SQ CM/< ICD-10 Description Diagnosis E11.621 Type 2 diabetes mellitus with foot ulcer L97.522 Non-pressure chronic ulcer of other part of left foot Modifier: with fat lay Quantity: 1 er exposed Physician Procedures CPT4 Code Description: [id number] - WC PHYS SUBQ TISS 20 SQ CM ICD-10 Description Diagnosis E11.621 Type 2 diabetes mellitus with  foot ulcer L97.522 Non-pressure chronic ulcer of other part of left foot Modifier: with fat laye Quantity: 1 r exposed Electronic Signature(s) Signed: 06/19/2016 4:39:00 PM By: Linton Ham MD Entered By: Linton Ham on 06/19/2016 11:00:42

## 2016-06-20 NOTE — Progress Notes (Signed)
[name] ([id number]) Visit Report for 06/19/2016 Arrival Information Details Patient Name: [name], [name] Date of Service: 06/19/2016 10:00 AM Medical Record Patient Account Number: [id number] Number: Treating RN: [name] [name] (70 y.o. Other Clinician: Date of Birth/Sex: Female) Treating [name] Primary Care Physician/Extender: [name] Physician: Referring Physician: Brendia [name] in Treatment: 2 Visit Information History Since Last Visit Added or deleted any medications: No Patient Arrived: Ambulatory Any new allergies or adverse reactions: No Arrival Time: 10:11 Had a fall or experienced change in No Accompanied By: [name] activities of daily living that may affect Transfer Assistance: None risk of falls: Patient Identification Verified: Yes Signs or symptoms of abuse/neglect since last No Secondary Verification Process Yes visito Completed: Hospitalized since last visit: No Patient Has Alerts: Yes Pain Present Now: No Patient Alerts: DMII Electronic Signature(s) Signed: 06/19/2016 6:00:24 PM By: [name] Entered By: [name] on 06/19/2016 10:14:56 [name], [name] ([id number]) -------------------------------------------------------------------------------- Encounter Discharge Information Details Patient Name: [name], [name] Date of Service: 06/19/2016 10:00 AM Medical Record Patient Account Number: [id number] Number: Treating RN: [name] [name] (70 y.o. Other Clinician: Date of Birth/Sex: Female) Treating [name] Primary Care Physician/Extender: [name] Physician: Referring Physician: Brendia [name] in Treatment: 2 Encounter Discharge Information Items Discharge Pain Level: 0 Discharge Condition: Stable Ambulatory Status: Ambulatory Discharge Destination: Home Transportation: Private Auto Accompanied By: [name] Schedule Follow-up Appointment:  Yes Medication Reconciliation completed and provided to Patient/Care No [name]: Provided on Clinical Summary of Care: 06/19/2016 Form Type Recipient Paper Patient BR Electronic Signature(s) Signed: 06/19/2016 10:45:35 AM By: [name] Entered By: [name] on 06/19/2016 10:45:35 [place], [place] ([id number]) -------------------------------------------------------------------------------- Lower Extremity Assessment Details Patient Name: [name], [name] Date of Service: 06/19/2016 10:00 AM Medical Record Patient Account Number: [id number] Number: Treating RN: [name] [birth year] [name] (70 y.o. Other Clinician: Date of Birth/Sex: Female) Treating [name] Primary Care Physician/Extender: [name] Physician: Referring Physician: Lanier Prude [name] in Treatment: 2 Edema Assessment Assessed: [Left: No] [Right: No] Edema: [Left: N] [Right: o] Vascular Assessment Pulses: Posterior Tibial Dorsalis Pedis Palpable: [Left:Yes] Extremity colors, hair growth, and conditions: Extremity Color: [Left:Normal] Hair Growth on Extremity: [Left:Yes] Temperature of Extremity: [Left:Warm] Capillary Refill: [Left:< 3 seconds] Electronic Signature(s) Signed: 06/19/2016 6:00:24 PM By: [name] Entered By: [name] on 06/19/2016 10:32:07 App, Lonia L. ([id number]) -------------------------------------------------------------------------------- Multi Wound Chart Details Patient Name: [name], [name] Date of Service: 06/19/2016 10:00 AM Medical Record Patient Account Number: [id number] Number: Treating RN: [name] [birth year] (70 y.o. 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 Secondary Etiology: Pressure Ulcer Diabetic Wound/Ulcer of N/A the Lower Extremity Comorbid History: Hypertension, Type II Hypertension, Type II N/A Diabetes Diabetes Date Acquired: 03/25/2016 06/03/2016 N/A [name] of Treatment: 2 2 N/A Wound Status: Open Open N/A Measurements L x W x D 1.1x1.1x0.2 0.1x0.1x0.1 N/A (cm) Area (cm) : 0.95 0.008 N/A Volume (cm) : 0.19 0.001 N/A % Reduction in Area: -72.70% 98.90% N/A % Reduction in Volume: 30.90% 98.60% N/A Classification: Grade 2 Category/Stage I N/A HBO Classification: N/A Grade  1 N/A Exudate Amount: Large Small N/A Exudate Type: Serous Serous N/A [name], [name] ([id number]) Exudate Color: amber amber N/A Wound Margin: Epibole Flat and Intact N/A Granulation Amount: Large (67-100%) None Present (0%) N/A Granulation Quality: Red N/A N/A Necrotic Amount: Small (1-33%) None Present (0%) N/A Necrotic Tissue: Eschar N/A N/A Exposed Structures: Fat: Yes Fascia: No N/A Fascia: No Fat: No Tendon: No Tendon: No Muscle: No Muscle: No Joint: No Joint: No Bone: No Bone: No Limited to Skin Breakdown Epithelialization: None None N/A Periwound Skin Texture: Callus: Yes No Abnormalities Noted N/A Edema: No Excoriation: No Induration: No Crepitus: No Fluctuance: No Friable: No Rash: No Scarring: No Periwound Skin Maceration: No Maceration: Yes N/A Moisture: Moist: No Dry/Scaly: No Periwound Skin Color: Atrophie Blanche: No No Abnormalities Noted N/A Cyanosis: No Ecchymosis: No Erythema: No Hemosiderin Staining: No Mottled: No Pallor: No Rubor: No Temperature: No Abnormality N/A N/A Tenderness on No No N/A Palpation: Wound  Preparation: Ulcer Cleansing: Ulcer Cleansing: N/A Rinsed/Irrigated with Rinsed/Irrigated with Saline Saline Topical Anesthetic Topical Anesthetic Applied: Other: lidocaine Applied: Other: lidocaine 4% 4% Treatment Notes Electronic Signature(s) [name], [name] ([id number]) Signed: 06/19/2016 6:00:24 PM By: [name] Entered By: [name] on 06/19/2016 10:32:28 [name], [name] ([id number]) -------------------------------------------------------------------------------- [place] Details Patient Name: [name], [name] Date of Service: 06/19/2016 10:00 AM Medical Record Patient Account Number: [id number] Number: Treating RN: [name] [name] (70 y.o. Other Clinician: Date of Birth/Sex: Female) Treating [name] Primary Care Physician/Extender: [name] Physician: Referring Physician: Brendia [name] in Treatment: 2 Active Inactive Medication Nursing Diagnoses: Knowledge deficit related to medication safety: actual or potential Goals: Patient/caregiver will demonstrate understanding of new oral/IV medications prescribed at the [facility] (topical prescriptions are covered under the skin breakdown problem) Date Initiated: 06/05/2016 Goal Status: Active Interventions: Assess patient/caregiver ability to manage medication regimen upon admission and as needed Patient/Caregiver given reconciled medication list upon admission, changes in medications and discharge from the [place] Notes: Nutrition Nursing Diagnoses: Potential for alteratiion in Nutrition/Potential for imbalanced nutrition Goals: Patient/caregiver verbalizes understanding of need to maintain therapeutic glucose control per primary care physician Date Initiated: 06/05/2016 Goal Status: Active Interventions: Provide education on elevated blood sugars and impact on wound healing Notes: Orientation to the [facility], [place].  ([id number]) Nursing Diagnoses: Knowledge deficit related to the wound healing center program Goals: Patient/caregiver will verbalize understanding of the [place] Program Date Initiated: 06/05/2016 Goal Status: Active Interventions: Provide education on orientation to the wound center Notes: Pressure Nursing Diagnoses: Knowledge deficit related to management of pressures ulcers Goals: Patient will remain free of pressure ulcers Date Initiated: 06/05/2016 Goal Status: Active Interventions: Assess: immobility, friction, shearing, incontinence upon admission and as needed Treatment Activities: Patient referred for seating evaluation to ensure proper offloading : 06/05/2016 Notes: Wound/Skin Impairment Nursing Diagnoses: Impaired tissue integrity Goals: Ulcer/skin breakdown will heal within 14 [name] Date Initiated: 06/05/2016 Goal Status: Active Interventions: Assess patient/caregiver ability to obtain necessary supplies Notes: RUTHIE, BERCH ([id number]) Electronic Signature(s) Signed: 06/19/2016 6:00:24 PM By: [name] Entered By: [name] on 06/19/2016 10:32:14 Kazmierczak, Velvet L. ([id number]) -------------------------------------------------------------------------------- Pain Assessment Details Patient Name: [place], [name] L. Date of Service: 06/19/2016 10:00 AM Medical Record Patient Account Number: [id number] Number: Treating RN: [name] [birth year] (70 y.o. Other Clinician: Date of Birth/Sex: Female) Treating [name] Primary Care Physician/Extender: [name] Physician: Referring Physician: Brendia [name] in Treatment: 2 Active Problems Location of Pain Severity and  Description of Pain Patient Has Paino No Site Locations Pain Management and Medication Current Pain Management: Notes Topical or injectable lidocaine is offered to patient for acute pain when surgical debridement is performed. If needed,  Patient is instructed to use over the counter pain medication for the following 24-48 hours after debridement. Wound care MDs do not prescribed pain medications. Patient has chronic pain or uncontrolled pain. Patient has been instructed to make an appointment with their Primary Care Physician for pain management. Electronic Signature(s) Signed: 06/19/2016 6:00:24 PM By: [name] Entered By: [name] on 06/19/2016 10:16:26 Filler, [name] ([id number]) -------------------------------------------------------------------------------- Patient/Caregiver Education Details Patient Name: [name], [name] Date of Service: 06/19/2016 10:00 AM Medical Record Patient Account Number: [id number] Number: Treating RN: [name] [name] (70 y.o. Other Clinician: Date of Birth/Gender: Female) Treating [name] Primary Care Physician/Extender: Ashok Croon Physician: Suella [place] in Treatment: 2 Referring Physician: Lanier Prude Education Assessment Education Provided To: Patient and Caregiver Education Topics Provided Wound/Skin Impairment: Handouts: Other: wound care as ordered Methods: Demonstration, Explain/Verbal Responses: State content correctly Electronic Signature(s) Signed: 06/19/2016 6:00:24 PM By: [name] Entered By: [name] on 06/19/2016 10:36:51 [name], [name] ([id number]) -------------------------------------------------------------------------------- Wound Assessment Details Patient Name: Farnell, Haydan L. Date of Service: 06/19/2016 10:00 AM Medical Record Patient Account Number: [id number] Number: Treating RN: [name] [birth year] [name] (70 y.o. Other Clinician: Date of Birth/Sex: Female) Treating [name] Primary Care Physician/Extender: [name] Physician: Referring Physician: Brendia [name] in Treatment: 2 Wound Status Wound Number: 2 Primary Etiology: Diabetic Wound/Ulcer of the  Lower Extremity Wound Location: Left Foot - Plantar Secondary Pressure Ulcer Wounding Event: Gradually Appeared Etiology: Date Acquired: 03/25/2016 Wound Status: Open [name] Of Treatment: 2 Comorbid Hypertension, Type II Diabetes Clustered Wound: No History: Photos Wound Measurements Length: (cm) 1.1 % Reduction in Area Width: (cm) 1.1 % Reduction in Volu Depth: (cm) 0.2 Epithelialization: Area: (cm) 0.95 Tunneling: Volume: (cm) 0.19 Undermining: : -72.7% me: 30.9% None No No Wound Description Classification: Grade 2 Foul Odor After Cle Wound Margin: Epibole Exudate Amount: Large Exudate Type: Serous Exudate Color: amber ansing: No Wound Bed Granulation Amount: Large (67-100%) Exposed Structure Granulation Quality: Red Fascia Exposed: No Necrotic Amount: Small (1-33%) Fat Layer Exposed: Yes Necrotic Quality: Eschar Tendon Exposed: No Derflinger, Aithana L. ([id number]) Muscle Exposed: No Joint Exposed: No Bone Exposed: No Periwound Skin Texture Texture Color No Abnormalities Noted: No No Abnormalities Noted: No Callus: Yes Atrophie Blanche: No Crepitus: No Cyanosis: No Excoriation: No Ecchymosis: No Fluctuance: No Erythema: No Friable: No Hemosiderin Staining: No Induration: No Mottled: No Localized Edema: No Pallor: No Rash: No Rubor: No Scarring: No Temperature / Pain Moisture Temperature: No Abnormality No Abnormalities Noted: No Dry / Scaly: No Maceration: No Moist: No Wound Preparation Ulcer Cleansing: Rinsed/Irrigated with Saline Topical Anesthetic Applied: Other: lidocaine 4%, Treatment Notes Wound #2 (Left, Plantar Foot) 1. Cleansed with: Clean wound with Normal Saline 2. Anesthetic Topical Lidocaine 4% cream to wound bed prior to debridement 4. Dressing Applied: Aquacel Ag 5. Secondary Dressing Applied Gauze and Kerlix/Conform 7. Secured with Tape Notes secured lightly with coban Electronic Signature(s) Signed: 06/19/2016  6:00:24 PM By: [name] Entered By: [name] on 06/19/2016 10:23:24 Latterell, Ellarose L. ([id number]) -------------------------------------------------------------------------------- Wound Assessment Details Patient Name: Heninger, Lorece L. Date of Service: 06/19/2016 10:00 AM Medical Record Patient Account Number: [id number] Number: Treating RN: [name] [birth year] (70 y.o. Other Clinician: Date of Birth/Sex: Female) Treating [name] Primary Care Physician/Extender: [name] Pandy,  [place] Physician: Referring Physician: Brendia [name] in Treatment: 2 Wound Status Wound Number: 3 Primary Etiology: Pressure Ulcer Wound Location: Left Foot - Lateral Secondary Diabetic Wound/Ulcer of the Lower Etiology: Extremity Wounding Event: Blister Wound Status: Open Date Acquired: 06/03/2016 Comorbid Hypertension, Type II Diabetes [name] Of Treatment: 2 History: Clustered Wound: No Photos Wound Measurements Length: (cm) 0.1 Width: (cm) 0.1 Depth: (cm) 0.1 Area: (cm) 0.008 Volume: (cm) 0.001 % Reduction in Area: 98.9% % Reduction in Volume: 98.6% Epithelialization: None Tunneling: No Undermining: No Wound Description Classification: Category/Stage I Diabetic Severity (Wagner): Grade 1 Wound Margin: Flat and Intact Exudate Amount: Small Exudate Type: Serous Exudate Color: amber Wound Bed Granulation Amount: None Present (0%) Exposed Structure Necrotic Amount: None Present (0%) Fascia Exposed: No Fat Layer Exposed: No Tendon Exposed: No Milosevic, Jorgina L. ([id number]) Muscle Exposed: No Joint Exposed: No Bone Exposed: No Limited to Skin Breakdown Periwound Skin Texture Texture Color No Abnormalities Noted: No No Abnormalities Noted: No Moisture No Abnormalities Noted: No Maceration: Yes Wound Preparation Ulcer Cleansing: Rinsed/Irrigated with Saline Topical Anesthetic Applied: Other: lidocaine 4%, Treatment Notes Wound #3 (Left, Lateral  Foot) 1. Cleansed with: Clean wound with Normal Saline 2. Anesthetic Topical Lidocaine 4% cream to wound bed prior to debridement 4. Dressing Applied: Aquacel Ag 5. Secondary Dressing Applied Gauze and Kerlix/Conform 7. Secured with Tape Notes secured lightly with coban Electronic Signature(s) Signed: 06/19/2016 6:00:24 PM By: [name] Entered By: [name] on 06/19/2016 10:23:45 Lax, Kaitlin L. ([id number]) -------------------------------------------------------------------------------- Vitals Details Patient Name: Ringwald, Sharryn L. Date of Service: 06/19/2016 10:00 AM Medical Record Patient Account Number: [id number] Number: Treating RN: [name] [name] (70 y.o. Other Clinician: Date of Birth/Sex: Female) Treating [name] Primary Care Physician/Extender: [name] Physician: Referring Physician: Brendia [name] in Treatment: 2 Vital Signs Time Taken: 10:16 Temperature (F): 98.1 Height (in): 62 Pulse (bpm): 84 Weight (lbs): 140 Respiratory Rate (breaths/min): 16 Body Mass Index (BMI): 25.6 Blood Pressure (mmHg): 178/84 Reference Range: 80 - 120 mg / dl Electronic Signature(s) Signed: 06/19/2016 6:00:24 PM By: [name] Entered By: [name] on 06/19/2016 10:16:50

## 2016-06-26 ENCOUNTER — Other Ambulatory Visit: Payer: Self-pay | Admitting: Internal Medicine

## 2016-06-26 ENCOUNTER — Encounter: Payer: Medicare Other | Admitting: Internal Medicine

## 2016-06-26 DIAGNOSIS — L97521 Non-pressure chronic ulcer of other part of left foot limited to breakdown of skin: Secondary | ICD-10-CM

## 2016-06-26 DIAGNOSIS — E11621 Type 2 diabetes mellitus with foot ulcer: Secondary | ICD-10-CM | POA: Diagnosis not present

## 2016-06-27 NOTE — Progress Notes (Signed)
RENELLA, STEIG ([id number]) Visit Report for 06/26/2016 Chief Complaint Document Details Patient Name: [name], [name] Date of Service: 06/26/2016 2:00 PM Medical Record Patient Account Number: [id number] Number: Treating RN: Montey Hora 1946/06/30 (70 y.o. Other Clinician: Date of Birth/Sex: Female) Treating Goerge Mohr Primary Care Physician/Extender: Ashok Croon Physician: Referring Physician: Brendia Sacks in Treatment: 3 Information Obtained from: Patient Chief Complaint 06/05/16; patient is here for review of a wound on her right foot which is been present for at least a month Electronic Signature(s) Signed: 06/26/2016 5:40:41 PM By: Linton Ham MD Entered By: Linton Ham on 06/26/2016 15:01:26 [place], [place]. ([id number]) -------------------------------------------------------------------------------- Debridement Details Patient Name: [name], [name] Date of Service: 06/26/2016 2:00 PM Medical Record Patient Account Number: [id number] Number: Treating RN: Montey Hora 1945/11/05 (70 y.o. Other Clinician: Date of Birth/Sex: Female) Treating Lugene Beougher Primary Care Physician/Extender: Estanislado Pandy, Cecille Rubin Physician: Referring Physician: Brendia Sacks in Treatment: 3 Debridement Performed for Wound #2 Left,Plantar Foot Assessment: Performed By: Physician Ricard Dillon, MD Debridement: Debridement Pre-procedure Yes - 14:31 Verification/Time Out Taken: Start Time: 14:31 Pain Control: Lidocaine 4% Topical Solution Level: Skin/Subcutaneous Tissue Total Area Debrided (L x 0.9 (cm) x 0.9 (cm) = 0.81 (cm) W): Tissue and other Viable, Non-Viable, Fibrin/Slough, Subcutaneous material debrided: Instrument: Curette Bleeding: Minimum Hemostasis Achieved: Pressure End Time: 14:34 Procedural Pain: 0 Post Procedural Pain: 0 Response to Treatment: Procedure was tolerated well Post Debridement Measurements of  Total Wound Length: (cm) 0.9 Width: (cm) 0.9 Depth: (cm) 0.2 Volume: (cm) 0.127 Character of Wound/Ulcer Post Improved Debridement: Severity of Tissue Post Debridement: Fat layer exposed Post Procedure Diagnosis Same as Pre-procedure Electronic Signature(s) Signed: 06/26/2016 5:10:32 PM By: Elvina Mattes, [name] ([id number]) Signed: 06/26/2016 5:40:41 PM By: Linton Ham MD Entered By: Linton Ham on 06/26/2016 15:00:47 [place], [place] ([id number]) -------------------------------------------------------------------------------- HPI Details Patient Name: [name], [name] Date of Service: 06/26/2016 2:00 PM Medical Record Patient Account Number: [id number] Number: Treating RN: Montey Hora 1945/07/29 (70 y.o. Other Clinician: Date of Birth/Sex: Female) Treating Bettyjean Stefanski Primary Care Physician/Extender: Estanislado Pandy, Cecille Rubin Physician: Referring Physician: Brendia Sacks in Treatment: 3 History of Present Illness HPI Description: The patient is here for followup. Denies fevers, has been compliant with wearing Darco shoes. She states that her blood sugars are still in the low 200s. [name] is a 10F with a h/o DM who presents with an open ulcer on the plantar aspect of her L great toe. She has had it for close to 8 months. She has also had several debridements as well as courses of oral antibiotics. Currently, she denies fevers and has no pain. She has never had ulcers like this in the past. Her blood sugars are normally in the 170s. Foot xray was negative for osteomyelitis. HgbA1c was 10.3. READMISSION; 06/05/16; [name] is a 70 year old type II diabetic by description not well-controlled. She does not have known PAD or claudication. She has apparently some degree of neuropathy or she's been told that in the past. She has a history of a wound on the plantar left foot foot for which she was seen here in 2015 although I was not involved in  her care nor were any other current physicians working in our clinics. She was managed with standard dressings and a Darco forefoot offloading boot and she seems to have healed quite quickly. Apparently the patient noticed a callus/wound development a month ago although her daughter who is present states it was  longer than that. Indeed the patient seems to been followed for at least 3 clinic visits with her podiatrist Dr. Gardiner Barefoot. On October 19 noted a necrotic ulcer on the plantar aspect of the left first metatarsal head. She was using Silvadene based dressings given Keflex on 10/9. I don't believe any cultures or x-rays were done. More importantly she doesn't seem to be adequately offloading this area in her footwear ABIs in this clinic were 0.88 bilaterally. 06/12/16; continued follow-up for a central wound over the left first metatarsal head. Once again today required an extensive debridement. She is using a Darco forefoot offloading boot. Her x-ray of the foot was negative for any bone changes. She has not yet had vascular studies. ABI in this clinic was 0.88 06/19/16; formal arterial studies for early January for her daughter. In general the major wound over the plantar left first metatarsal head looks better. Area on her lateral fifth metatarsal head looks like it's proceeding towards closure. She is wearing the Darco forefoot offloading sandal was some difficulty 06/26/16; formal arterial studies towboat for early January. The area on the plantar left first metatarsal head apparently measures 0.2 x 0.2 mm smaller. She has the same surface slough and some nonviable Car, [name] ([id number]) surrounding tissue which is debrided with a curet. This cleans up quite nicely. The area on her lateral fifth metatarsal head is still fully epithelialized Electronic Signature(s) Signed: 06/26/2016 5:40:41 PM By: Linton Ham MD Entered By: Linton Ham on 06/26/2016 15:04:16 [place],  [place] ([id number]) -------------------------------------------------------------------------------- Physical Exam Details Patient Name: [name], [name] Date of Service: 06/26/2016 2:00 PM Medical Record Patient Account Number: [id number] Number: Treating RN: Montey Hora [birth year] (70 y.o. Other Clinician: Date of Birth/Sex: Female) Treating Aneesah Hernan Primary Care Physician/Extender: Estanislado Pandy, Cecille Rubin Physician: Referring Physician: Brendia Sacks in Treatment: 3 Constitutional Sitting or standing Blood Pressure is within target range for patient.. Pulse regular and within target range for patient.Marland Kitchen Respirations regular, non-labored and within target range.. Temperature is normal and within the target range for the patient.. Patient's appearance is neat and clean. Appears in no acute distress. Well nourished and well developed.. Eyes Conjunctivae clear. No discharge.. Notes Wound exam; plantar aspect of the first metatarsal head. Using a #3 curet debrided of surface slough circumferential skin and subcutaneous tissue. Post debridement as usual this wound looks very healthy. There is no evidence of surrounding infection. Peripheral pulses are palpable Electronic Signature(s) Signed: 06/26/2016 5:40:41 PM By: Linton Ham MD Entered By: Linton Ham on 06/26/2016 15:05:15 [place], [place] ([id number]) -------------------------------------------------------------------------------- Physician Orders Details Patient Name: [name], [name] Date of Service: 06/26/2016 2:00 PM Medical Record Patient Account Number: [id number] Number: Treating RN: Montey Hora [birth year] (70 y.o. Other Clinician: Date of Birth/Sex: Female) Treating Florestine Carmical Primary Care Physician/Extender: Estanislado Pandy, Cecille Rubin Physician: Referring Physician: Brendia Sacks in Treatment: 3 Verbal / Phone Orders: Yes Clinician: Dorthy, Joanna Read Back and Verified:  Yes Diagnosis Coding Wound Cleansing Wound #2 [place] wound with Normal Saline. Wound #3 Left,Lateral Foot o Clean wound with Normal Saline. Anesthetic Wound #2 Left,Plantar Foot o Topical Lidocaine 4% cream applied to wound bed prior to debridement Wound #3 Left,Lateral Foot o Topical Lidocaine 4% cream applied to wound bed prior to debridement Primary Wound Dressing Wound #2 Left,Plantar Foot o Aquacel Ag Wound #3 Left,Lateral Foot o Foam Secondary Dressing Wound #2 Left,Plantar Foot o ABD and Kerlix/Conform - secure lightly with coban Wound #3 Left,Lateral Foot   o ABD and Kerlix/Conform - secure lightly with coban Dressing Change Frequency Wound #2 Left,Plantar Foot o Change dressing every other day. Wound #3 Left,Lateral Foot [name], [name] ([id number]) o Change dressing every other day. Follow-up Appointments Wound #2 [place] o Return Appointment in 1 week. Wound #3 Left,Lateral Foot o Return Appointment in 1 week. Off-Loading Wound #2 Left,Plantar Foot o Other: - front-offloader Wound #3 Left,Lateral Foot o Other: - front-offloader Additional Orders / Instructions Wound #2 Left,Plantar Foot o Increase protein intake. Wound #3 Left,Lateral Foot o Increase protein intake. Electronic Signature(s) Signed: 06/26/2016 5:10:32 PM By: Montey Hora Signed: 06/26/2016 5:40:41 PM By: Linton Ham MD Entered By: Montey Hora on 06/26/2016 14:35:09 [place], [place] ([id number]) -------------------------------------------------------------------------------- Problem List Details Patient Name: [name], [name] Date of Service: 06/26/2016 2:00 PM Medical Record Patient Account Number: [id number] Number: Treating RN: Montey Hora 1946/04/20 (70 y.o. 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 Date of Service: 06/26/2016 2:00 PM Medical Record Patient Account Number: [id number] Number: Treating RN: Montey Hora January 07, 1946 (70 y.o. Other Clinician: Date of Birth/Sex: Female) Treating Ilithyia Titzer Primary Care Physician/Extender: Ashok Croon Physician: Referring Physician: Brendia Sacks in Treatment: 3 Subjective Chief Complaint Information obtained from Patient 06/05/16; patient is here for review of a wound on her right foot which is been present for at least a month History of Present Illness (HPI) The patient is here for followup. Denies fevers, has been compliant with wearing Darco shoes. She states that her blood sugars are still in the low 200s. Aubreyana is a 38F with a h/o DM who presents with an open ulcer on the plantar aspect of her L great toe. She has had it for close to 8 months. She has also had several debridements as well as courses of oral antibiotics. Currently, she denies fevers and has no pain. She has never had ulcers like this in the past. Her blood sugars are normally in the 170s. Foot xray was negative for osteomyelitis. HgbA1c was 10.3. READMISSION; 06/05/16; [name] is a 70 year old type II diabetic by description not well-controlled. She does not have known PAD or claudication. She has apparently some degree of  neuropathy or she's been told that in the past. She has a history of a wound on the plantar left foot foot for which she was seen here in 2015 although I was not involved in her care nor were any other current physicians working in our clinics. She was managed with standard dressings and a Darco forefoot offloading boot and she seems to have healed quite quickly. Apparently the patient noticed a callus/wound development a month ago although her daughter who is present states it was longer than that. Indeed the patient seems to been followed for at least 3 clinic visits with her podiatrist Dr. Gardiner Barefoot. On October 19 noted a necrotic ulcer on the plantar aspect of the left first metatarsal head. She was using Silvadene based dressings given Keflex on 10/9. I don't  believe any cultures or x-rays were done. More importantly she doesn't seem to be adequately offloading this area in her footwear ABIs in this clinic were 0.88 bilaterally. 06/12/16; continued follow-up for a central wound over the left first metatarsal head. Once again today required an extensive debridement. She is using a Darco forefoot offloading boot. Her x-ray of the foot was negative for any bone changes. She has not yet had vascular studies. ABI in this clinic was 0.88 [name], [place]. ([id number]) 06/19/16; formal arterial studies for early January for her daughter. In general the major wound over the plantar left first metatarsal head looks better. Area on her lateral fifth metatarsal head looks like it's proceeding towards closure. She is wearing the Darco forefoot offloading sandal was some difficulty 06/26/16; formal arterial studies towboat for early January. The area on the plantar left first metatarsal head apparently measures 0.2 x 0.2 mm smaller. She has the same surface slough and some nonviable surrounding tissue which is debrided with a curet. This cleans up quite nicely. The area on her lateral  fifth metatarsal head is still fully epithelialized Objective Constitutional Sitting or standing Blood Pressure is within target range for patient.. Pulse regular and within target range for patient.Marland Kitchen Respirations regular, non-labored and within target range.. Temperature is normal and within the target range for the patient.. Patient's appearance is neat and clean. Appears in no acute distress. Well nourished and well developed.. Vitals Time Taken: 2:08 PM, Height: 62 in, Weight: 140 lbs, BMI: 25.6, Temperature: 98.3 F, Pulse: 75 bpm, Respiratory Rate: 18 breaths/min, Blood Pressure: 184/74 mmHg. Eyes Conjunctivae clear. No discharge.. General Notes: Wound exam; plantar aspect of the first metatarsal head. Using a #3 curet debrided of surface slough circumferential skin and subcutaneous tissue. Post debridement as usual this wound looks very healthy. There is no evidence of surrounding infection. Peripheral pulses are palpable Integumentary (Hair, Skin) Wound #2 status is Open. Original cause of wound was Gradually Appeared. The wound is located on the [place]. The wound measures 0.9cm length x 0.9cm width x 0.2cm depth; 0.636cm^2 area and 0.127cm^3 volume. There is fat exposed. There is no tunneling or undermining noted. There is a large amount of serous drainage noted. The wound margin is epibole. There is large (67-100%) red granulation within the wound bed. There is a small (1-33%) amount of necrotic tissue within the wound bed including Eschar. The periwound skin appearance exhibited: Callus. The periwound skin appearance did not exhibit: Crepitus, Excoriation, Fluctuance, Friable, Induration, Localized Edema, Rash, Scarring, Dry/Scaly, Maceration, Moist, Atrophie Blanche, Cyanosis, Ecchymosis, Hemosiderin Staining, Mottled, Pallor, Rubor, Erythema. Periwound temperature was noted as No Abnormality. Wound #3 status is Open. Original cause of wound was Blister. The wound is  located on the Left,Lateral Foot. The wound measures 0.1cm length x 0.1cm width x 0.1cm depth; 0.008cm^2 area and 0.001cm^3 volume. The wound is limited to skin breakdown. There is no tunneling or undermining noted. There is a small amount of serous drainage noted. The wound margin is flat and intact. There is no granulation within the wound bed. There is no necrotic tissue within the wound bed. The periwound skin appearance [place], [place] ([id number]) exhibited: Maceration. Assessment Active Problems ICD-10 E11.621 - Type 2 diabetes mellitus with foot ulcer L97.522 - Non-pressure chronic ulcer of other part of left foot with fat layer exposed Procedures Wound #2 Wound #2 is a Diabetic Wound/Ulcer of the Lower Extremity located on the Left,Plantar Foot . There was a Skin/Subcutaneous Tissue Debridement ([id number])  debridement with total area of 0.81 sq cm performed by Ricard Dillon, MD. with the following instrument(s): Curette to remove Viable and Non-Viable tissue/material including Fibrin/Slough and Subcutaneous after achieving pain control using Lidocaine 4% Topical Solution. A time out was conducted at 14:31, prior to the start of the procedure. A Minimum amount of bleeding was controlled with Pressure. The procedure was tolerated well with a pain level of 0 throughout and a pain level of 0 following the procedure. Post Debridement Measurements: 0.9cm length x 0.9cm width x 0.2cm depth; 0.127cm^3 volume. Character of Wound/Ulcer Post Debridement is improved. Severity of Tissue Post Debridement is: Fat layer exposed. Post procedure Diagnosis Wound #2: Same as Pre-Procedure Plan Wound Cleansing: Wound #2 Left,Plantar Foot: Clean wound with Normal Saline. Wound #3 Left,Lateral Foot: Clean wound with Normal Saline. Anesthetic: Wound #2 Left,Plantar Foot: Topical Lidocaine 4% cream applied to wound bed prior to debridement Wound #3 Left,Lateral Foot: [name], [name]  ([id number]) Topical Lidocaine 4% cream applied to wound bed prior to debridement Primary Wound Dressing: Wound #2 Left,Plantar Foot: Aquacel Ag Wound #3 Left,Lateral Foot: Foam Secondary Dressing: Wound #2 Left,Plantar Foot: ABD and Kerlix/Conform - secure lightly with coban Wound #3 Left,Lateral Foot: ABD and Kerlix/Conform - secure lightly with coban Dressing Change Frequency: Wound #2 Left,Plantar Foot: Change dressing every other day. Wound #3 Left,Lateral Foot: Change dressing every other day. Follow-up Appointments: Wound #2 Left,Plantar Foot: Return Appointment in 1 week. Wound #3 Left,Lateral Foot: Return Appointment in 1 week. Off-Loading: Wound #2 Left,Plantar Foot: Other: - front-offloader Wound #3 Left,Lateral Foot: Other: - front-offloader Additional Orders / Instructions: Wound #2 Left,Plantar Foot: Increase protein intake. Wound #3 Left,Lateral Foot: Increase protein intake. Continue with Aquacle [employer] foam, darco forefoot offloading Tcc if this stalls Electronic Signature(s) Signed: 06/26/2016 5:40:41 PM By: Linton Ham MD Entered By: Linton Ham on 06/26/2016 15:06:33 [place], [place] ([id number]) -------------------------------------------------------------------------------- SuperBill Details Patient Name: [name], Gennesis L. Date of Service: 06/26/2016 Medical Record Patient Account Number: [id number] Number: Treating RN: Montey Hora 11/30/1945 (70 y.o. Other Clinician: Date of Birth/Sex: Female) Treating Allisa Einspahr Primary Care Physician/Extender: Ashok Croon Physician: Weeks in Treatment: 3 Referring Physician: Lanier Prude Diagnosis Coding ICD-10 Codes Code Description E11.621 Type 2 diabetes mellitus with foot ulcer L97.522 Non-pressure chronic ulcer of other part of left foot with fat layer exposed Facility Procedures CPT4 Code Description: [id number] 11042 - DEB SUBQ TISSUE 20 SQ CM/< ICD-10 Description Diagnosis  E11.621 Type 2 diabetes mellitus with foot ulcer L97.522 Non-pressure chronic ulcer of other part of left foot Modifier: with fat lay Quantity: 1 er exposed Physician Procedures CPT4 Code Description: [id number] - WC PHYS SUBQ TISS 20 SQ CM ICD-10 Description Diagnosis E11.621 Type 2 diabetes mellitus with foot ulcer L97.522 Non-pressure chronic ulcer of other part of left foot Modifier: with fat laye Quantity: 1 r exposed Electronic Signature(s) Signed: 06/26/2016 5:40:41 PM By: Linton Ham MD Entered By: Linton Ham on 06/26/2016 15:07:00

## 2016-06-27 NOTE — Progress Notes (Signed)
ERIEL, DUNCKEL ([id number]) Visit Report for 06/26/2016 Arrival Information Details Patient Name: [name], [name] Date of Service: 06/26/2016 2:00 PM Medical Record Patient Account Number: [id number] Number: Treating RN: Montey Hora [birth year] (70 y.o. Other Clinician: Date of Birth/Sex: Female) Treating ROBSON, MICHAEL Primary Care Physician/Extender: Estanislado Pandy, Cecille Rubin Physician: Referring Physician: Brendia Sacks in Treatment: 3 Visit Information History Since Last Visit Added or deleted any medications: No Patient Arrived: Ambulatory Any new allergies or adverse reactions: No Arrival Time: 14:04 Had a fall or experienced change in No Accompanied By: self activities of daily living that may affect Transfer Assistance: None risk of falls: Patient Identification Verified: Yes Signs or symptoms of abuse/neglect since last No Secondary Verification Process Yes visito Completed: Hospitalized since last visit: No Patient Has Alerts: Yes Pain Present Now: No Patient Alerts: DMII Electronic Signature(s) Signed: 06/26/2016 5:10:32 PM By: Montey Hora Entered By: Montey Hora on 06/26/2016 14:04:25 [name], [name] ([id number]) -------------------------------------------------------------------------------- Encounter Discharge Information Details Patient Name: [name], [name] Date of Service: 06/26/2016 2:00 PM Medical Record Patient Account Number: [id number] Number: Treating RN: Montey Hora [birth year] (70 y.o. Other Clinician: Date of Birth/Sex: Female) Treating ROBSON, MICHAEL Primary Care Physician/Extender: Estanislado Pandy, Cecille Rubin Physician: Referring Physician: Brendia Sacks in Treatment: 3 Encounter Discharge Information Items Discharge Pain Level: 0 Discharge Condition: Stable Ambulatory Status: Ambulatory Discharge Destination: Home Transportation: Private Auto Accompanied By: self Schedule Follow-up Appointment:  Yes Medication Reconciliation completed and provided to Patient/Care No Keante Urizar: Provided on Clinical Summary of Care: 06/26/2016 Form Type Recipient Paper Patient BR Electronic Signature(s) Signed: 06/26/2016 2:49:46 PM By: Ruthine Dose Entered By: Ruthine Dose on 06/26/2016 14:49:46 [place], [place] ([id number]) -------------------------------------------------------------------------------- Lower Extremity Assessment Details Patient Name: [name], [name] Date of Service: 06/26/2016 2:00 PM Medical Record Patient Account Number: [id number] Number: Treating RN: Montey Hora Nov 21, 1945 (70 y.o. Other Clinician: Date of Birth/Sex: Female) Treating ROBSON, MICHAEL Primary Care Physician/Extender: Estanislado Pandy, Cecille Rubin Physician: Referring Physician: Brendia Sacks in Treatment: 3 Edema Assessment Assessed: [Left: No] [Right: No] Edema: [Left: N] [Right: o] Vascular Assessment Pulses: Posterior Tibial Popliteal Palpable: [Left:Yes] Extremity colors, hair growth, and conditions: Extremity Color: [Left:Normal] Hair Growth on Extremity: [Left:No] Temperature of Extremity: [Left:Warm] Capillary Refill: [Left:< 3 seconds] Electronic Signature(s) Signed: 06/26/2016 5:10:32 PM By: Montey Hora Entered By: Montey Hora on 06/26/2016 14:11:15 [place], [place] ([id number]) -------------------------------------------------------------------------------- Multi Wound Chart Details Patient Name: [name], [name] Date of Service: 06/26/2016 2:00 PM Medical Record Patient Account Number: [id number] Number: Treating RN: Montey Hora [birth year] (70 y.o. 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 ([id number]) Exudate Type: Serous Serous N/A Exudate Color: amber amber N/A Wound Margin: Epibole Flat and Intact N/A Granulation Amount: Large (67-100%) None Present (0%) N/A Granulation Quality: Red N/A N/A Necrotic Amount: Small (1-33%) None Present (0%) N/A Necrotic Tissue: Eschar N/A N/A Exposed Structures: Fat: Yes Fascia: No N/A Fascia: No Fat: No Tendon: No Tendon: No Muscle: No Muscle: No Joint: No Joint: No Bone: No Bone: No Limited to Skin Breakdown Epithelialization: None Large (67-100%) N/A Debridement: Debridement ([id number]- N/A N/A 11047) Pre-procedure 14:31 N/A N/A Verification/Time Out Taken: Pain Control: Lidocaine 4% Topical N/A N/A Solution Tissue Debrided: Fibrin/Slough, N/A N/A Subcutaneous Level: Skin/Subcutaneous N/A N/A Tissue Debridement Area (sq 0.81 N/A N/A cm): Instrument: Curette N/A N/A Bleeding: Minimum N/A N/A Hemostasis Achieved: Pressure N/A N/A Procedural Pain: 0 N/A N/A Post Procedural Pain: 0 N/A N/A Debridement Treatment Procedure was tolerated N/A N/A Response:  well Post Debridement 0.9x0.9x0.2 N/A N/A Measurements L x W x D (cm) Post Debridement 0.127 N/A N/A Volume: (cm) Periwound Skin Texture: Callus: Yes No Abnormalities Noted N/A Edema: No Excoriation: No Induration: No Crepitus: No Fluctuance: No Friable: No Rash: No Scarring: No [name], [name] ([id number]) Periwound Skin Maceration: No Maceration: Yes N/A Moisture: Moist: No Dry/Scaly: No Periwound Skin Color: Atrophie Blanche: No No Abnormalities Noted N/A Cyanosis: No Ecchymosis: No Erythema: No Hemosiderin Staining: No Mottled: No Pallor: No Rubor: No Temperature: No Abnormality N/A N/A Tenderness on No No N/A Palpation: Wound Preparation: Ulcer Cleansing: Ulcer Cleansing: N/A Rinsed/Irrigated with Rinsed/Irrigated with Saline Saline Topical Anesthetic Topical Anesthetic Applied: Other: lidocaine Applied: Other: lidocaine 4% 4% Procedures Performed: Debridement N/A N/A Treatment Notes Wound #2 (Left, Plantar Foot) 1. Cleansed with: Clean wound with Normal Saline 2. Anesthetic Topical Lidocaine 4% cream to wound bed prior to debridement 4. Dressing Applied: Aquacel Ag 5. Secondary Dressing Applied Dry Gauze Kerlix/Conform Notes secured lightly with coban Wound #3 (Left, Lateral Foot) 1. Cleansed with: Clean wound with Normal Saline 4. Dressing Applied: Foam 5. Secondary Dressing Applied Kerlix/Conform Notes secured lightly with KIIRA, BRACH ([id number]) Electronic Signature(s) Signed: 06/26/2016 5:40:41 PM By: Linton Ham MD Entered By: Linton Ham on 06/26/2016 15:00:31 [place], [place]. ([id number]) -------------------------------------------------------------------------------- [name] Details Patient Name: [name], [name] Date of Service: 06/26/2016 2:00 PM Medical Record Patient Account Number: [id number] Number: Treating RN: Montey Hora 24-Nov-1945 (70 y.o. Other  Clinician: Date of Birth/Sex: Female) Treating ROBSON, MICHAEL Primary Care Physician/Extender: Estanislado Pandy, Cecille Rubin Physician: Referring Physician: Brendia Sacks in Treatment: 3 Active Inactive Medication Nursing Diagnoses: Knowledge deficit related to medication safety: actual or potential Goals: Patient/caregiver will demonstrate understanding of new oral/IV medications prescribed at the [facility] (topical prescriptions are covered under the skin breakdown problem) Date Initiated: 06/05/2016 Goal Status: Active Interventions: Assess patient/caregiver ability to manage medication regimen upon admission and as needed Patient/Caregiver given reconciled medication list upon admission, changes in medications and discharge from the [place] Notes: Nutrition Nursing Diagnoses: Potential for alteratiion in Nutrition/Potential for imbalanced nutrition Goals: Patient/caregiver verbalizes understanding of need to maintain therapeutic glucose control per primary care physician Date Initiated: 06/05/2016 Goal Status: Active Interventions: Provide education on elevated blood sugars and impact on wound healing Notes: Orientation to the [place], [place] ([id number]) Nursing Diagnoses: Knowledge deficit related to the wound healing center program Goals: Patient/caregiver will verbalize understanding of the [place] Program Date Initiated: 06/05/2016 Goal Status: Active Interventions: Provide education on orientation  to the wound center Notes: Pressure Nursing Diagnoses: Knowledge deficit related to management of pressures ulcers Goals: Patient will remain free of pressure ulcers Date Initiated: 06/05/2016 Goal Status: Active Interventions: Assess: immobility, friction, shearing, incontinence upon admission and as needed Treatment Activities: Patient referred for seating evaluation to ensure proper offloading : 06/05/2016 Notes: Wound/Skin  Impairment Nursing Diagnoses: Impaired tissue integrity Goals: Ulcer/skin breakdown will heal within 14 weeks Date Initiated: 06/05/2016 Goal Status: Active Interventions: Assess patient/caregiver ability to obtain necessary supplies Notes: [name], [name] ([id number]) Electronic Signature(s) Signed: 06/26/2016 5:10:32 PM By: Montey Hora Entered By: Montey Hora on 06/26/2016 14:14:16 [place], [place] ([id number]) -------------------------------------------------------------------------------- Pain Assessment Details Patient Name: [name], [name] Date of Service: 06/26/2016 2:00 PM Medical Record Patient Account Number: [id number] Number: Treating RN: Montey Hora 1946-06-13 (70 y.o. Other Clinician: Date of Birth/Sex: Female) Treating ROBSON, MICHAEL Primary Care Physician/Extender: Estanislado Pandy, Cecille Rubin Physician: Referring Physician: Brendia Sacks in Treatment: 3 Active Problems Location of Pain Severity and Description of Pain Patient Has Paino No Site Locations Pain Management and Medication Current Pain Management: Notes Topical or injectable lidocaine is offered to patient for acute pain when surgical debridement is performed. If needed, Patient is instructed to use over the counter pain medication for the following 24-48 hours after debridement. Wound care MDs do not prescribed pain medications. Patient has chronic pain or uncontrolled pain. Patient has been instructed to make an appointment with their Primary Care Physician for pain management. Electronic Signature(s) Signed: 06/26/2016 5:10:32 PM By: Montey Hora Entered By: Montey Hora on 06/26/2016 14:04:33 [place], [place] ([id number]) -------------------------------------------------------------------------------- Patient/Caregiver Education Details Patient Name: Dam, Hollyanne L. Date of Service: 06/26/2016 2:00 PM Medical Record Patient Account Number:  [id number] Number: Treating RN: Montey Hora 1946-03-01 (70 y.o. Other Clinician: Date of Birth/Gender: Female) Treating ROBSON, MICHAEL Primary Care Physician/Extender: Ashok Croon Physician: [place]ella [place] in Treatment: 3 Referring Physician: Lanier Prude Education Assessment Education Provided To: Patient Education Topics Provided Wound/Skin Impairment: Handouts: Other: wound care t continue as ordered Methods: Demonstration, Explain/Verbal, Printed Responses: State content correctly Electronic Signature(s) Signed: 06/26/2016 5:10:32 PM By: Montey Hora Entered By: Montey Hora on 06/26/2016 14:49:29 [place], [place] ([id number]) -------------------------------------------------------------------------------- Wound Assessment Details Patient Name: [name], [name] Date of Service: 06/26/2016 2:00 PM Medical Record Patient Account Number: [id number] Number: Treating RN: Montey Hora Nov 25, 1945 (70 y.o. Other Clinician: Date of Birth/Sex: Female) Treating ROBSON, MICHAEL Primary Care Physician/Extender: Estanislado Pandy, Cecille Rubin Physician: Referring Physician: Brendia Sacks in Treatment: 3 Wound Status Wound Number: 2 Primary Etiology: Diabetic Wound/Ulcer of the Lower Extremity Wound Location: Left Foot - Plantar Secondary Pressure Ulcer Wounding Event: Gradually Appeared Etiology: Date Acquired: 03/25/2016 Wound Status: Open Weeks Of Treatment: 3 Comorbid Hypertension, Type II Diabetes Clustered Wound: No History: Photos Wound Measurements Length: (cm) 0.9 % Reduction in Area Width: (cm) 0.9 % Reduction in Volu Depth: (cm) 0.2 Epithelialization: Area: (cm) 0.636 Tunneling: Volume: (cm) 0.127 Undermining: : -15.6% me: 53.8% None No No Wound Description Classification: Grade 2 Foul Odor After Cle Wound Margin: Epibole Exudate Amount: Large Exudate Type: Serous Exudate Color: amber ansing: No Wound Bed Granulation Amount:  Large (67-100%) Exposed Structure Granulation Quality: Red Fascia Exposed: No Zoll, Mckyla L. ([id number]) Necrotic Amount: Small (1-33%) Fat Layer Exposed: Yes Necrotic Quality: Eschar Tendon Exposed: No Muscle Exposed: No Joint Exposed: No Bone Exposed: No Periwound Skin Texture Texture Color No Abnormalities Noted: No No Abnormalities Noted: No Callus: Yes Atrophie Blanche: No Crepitus: No Cyanosis: No Excoriation:  No Ecchymosis: No Fluctuance: No Erythema: No Friable: No Hemosiderin Staining: No Induration: No Mottled: No Localized Edema: No Pallor: No Rash: No Rubor: No Scarring: No Temperature / Pain Moisture Temperature: No Abnormality No Abnormalities Noted: No Dry / Scaly: No Maceration: No Moist: No Wound Preparation Ulcer Cleansing: Rinsed/Irrigated with Saline Topical Anesthetic Applied: Other: lidocaine 4%, Treatment Notes Wound #2 (Left, Plantar Foot) 1. Cleansed with: Clean wound with Normal Saline 2. Anesthetic Topical Lidocaine 4% cream to wound bed prior to debridement 4. Dressing Applied: Aquacel Ag 5. Secondary Dressing Applied Dry Gauze Kerlix/Conform Notes secured lightly with coban Electronic Signature(s) Signed: 06/26/2016 5:10:32 PM By: Montey Hora Entered By: Montey Hora on 06/26/2016 14:13:42 [place], [place] ([id number]) [place], [place] ([id number]) -------------------------------------------------------------------------------- Wound Assessment Details Patient Name: Enslin, Josi L. Date of Service: 06/26/2016 2:00 PM Medical Record Patient Account Number: [id number] Number: Treating RN: Montey Hora [birth year] (70 y.o. Other Clinician: Date of Birth/Sex: Female) Treating ROBSON, MICHAEL Primary Care Physician/Extender: Estanislado Pandy, Cecille Rubin Physician: Referring Physician: Brendia Sacks in Treatment: 3 Wound Status Wound Number: 3 Primary Etiology: Pressure Ulcer Wound Location: Left Foot -  Lateral Secondary Diabetic Wound/Ulcer of the Lower Etiology: Extremity Wounding Event: Blister Wound Status: Open Date Acquired: 06/03/2016 Comorbid Hypertension, Type II Diabetes Weeks Of Treatment: 3 History: Clustered Wound: No Photos Wound Measurements Length: (cm) 0.1 Width: (cm) 0.1 Depth: (cm) 0.1 Area: (cm) 0.008 Volume: (cm) 0.001 % Reduction in Area: 98.9% % Reduction in Volume: 98.6% Epithelialization: Large (67-100%) Tunneling: No Undermining: No Wound Description Classification: Category/Stage I Diabetic Severity Earleen [place]): Grade 1 Wound Margin: Flat and Intact Exudate Amount: Small Exudate Type: Serous Exudate Color: amber Wound Bed Granulation Amount: None Present (0%) Exposed Structure Necrotic Amount: None Present (0%) Fascia Exposed: No [place], Jennica L. ([id number]) Fat Layer Exposed: No Tendon Exposed: No Muscle Exposed: No Joint Exposed: No Bone Exposed: No Limited to Skin Breakdown Periwound Skin Texture Texture Color No Abnormalities Noted: No No Abnormalities Noted: No Moisture No Abnormalities Noted: No Maceration: Yes Wound Preparation Ulcer Cleansing: Rinsed/Irrigated with Saline Topical Anesthetic Applied: Other: lidocaine 4%, Treatment Notes Wound #3 (Left, Lateral Foot) 1. Cleansed with: Clean wound with Normal Saline 4. Dressing Applied: Foam 5. Secondary Dressing Applied Kerlix/Conform Notes secured lightly with coban Electronic Signature(s) Signed: 06/26/2016 5:10:32 PM By: Montey Hora Entered By: Montey Hora on 06/26/2016 14:14:04 [place], [place] ([id number]) -------------------------------------------------------------------------------- Vitals Details Patient Name: Jiles, Naphtali L. Date of Service: 06/26/2016 2:00 PM Medical Record Patient Account Number: [id number] Number: Treating RN: Montey Hora 25-Oct-1945 (70 y.o. Other Clinician: Date of Birth/Sex: Female) Treating ROBSON,  MICHAEL Primary Care Physician/Extender: Estanislado Pandy, Cecille Rubin Physician: Referring Physician: Brendia Sacks in Treatment: 3 Vital Signs Time Taken: 14:08 Temperature (F): 98.3 Height (in): 62 Pulse (bpm): 75 Weight (lbs): 140 Respiratory Rate (breaths/min): 18 Body Mass Index (BMI): 25.6 Blood Pressure (mmHg): 184/74 Reference Range: 80 - 120 mg / dl Electronic Signature(s) Signed: 06/26/2016 5:10:32 PM By: Montey Hora Entered By: Montey Hora on 06/26/2016 14:10:50

## 2016-07-03 ENCOUNTER — Encounter: Payer: Medicare Other | Admitting: Internal Medicine

## 2016-07-03 DIAGNOSIS — E11621 Type 2 diabetes mellitus with foot ulcer: Secondary | ICD-10-CM | POA: Diagnosis not present

## 2016-07-04 NOTE — Progress Notes (Signed)
[name] ([id number]) Visit Report for 07/03/2016 Chief Complaint Document Details Patient Name: [name], [name] Date of Service: 07/03/2016 12:30 PM Medical Record Patient Account Number: [id number] Number: Treating RN: Montey Hora Jan 16, 1946 (70 y.o. Other Clinician: Date of Birth/Sex: Female) Treating Briarrose Shor Primary Care Physician/Extender: Ashok Croon Physician: Referring Physician: Brendia Sacks in Treatment: 4 Information Obtained from: Patient Chief Complaint 06/05/16; patient is here for review of a wound on her right foot which is been present for at least a month Electronic Signature(s) Signed: 07/03/2016 5:22:07 PM By: Linton Ham MD Entered By: Linton Ham on 07/03/2016 14:26:02 [place], [place]. ([id number]) -------------------------------------------------------------------------------- Debridement Details Patient Name: [name], [name] Date of Service: 07/03/2016 12:30 PM Medical Record Patient Account Number: [id number] Number: Treating RN: Montey Hora March 21, 1946 (70 y.o. Other Clinician: Date of Birth/Sex: Female) Treating Mariane Burpee Primary Care Physician/Extender: Estanislado Pandy, Cecille Rubin Physician: Referring Physician: Brendia Sacks in Treatment: 4 Debridement Performed for Wound #2 Left,Plantar Foot Assessment: Performed By: Physician Ricard Dillon, MD Debridement: Debridement Pre-procedure Yes - 13:01 Verification/Time Out Taken: Start Time: 13:01 Pain Control: Lidocaine 4% Topical Solution Level: Skin/Subcutaneous Tissue Total Area Debrided (L x 0.9 (cm) x 0.9 (cm) = 0.81 (cm) W): Tissue and other Viable, Non-Viable, Callus, Fibrin/Slough, Subcutaneous material debrided: Instrument: Curette Bleeding: Moderate Hemostasis Achieved: Silver Nitrate End Time: 13:05 Procedural Pain: 0 Post Procedural Pain: 0 Response to Treatment: Procedure was tolerated well Post Debridement  Measurements of Total Wound Length: (cm) 0.9 Width: (cm) 0.9 Depth: (cm) 0.2 Volume: (cm) 0.127 Character of Wound/Ulcer Post Improved Debridement: Severity of Tissue Post Debridement: Fat layer exposed Post Procedure Diagnosis Same as Pre-procedure Electronic Signature(s) Signed: 07/03/2016 5:22:07 PM By: Linton Ham MD [name] ([id number]) Signed: 07/03/2016 5:43:35 PM By: Montey Hora Entered By: Linton Ham on 07/03/2016 14:25:24 Jennings, [place] ([id number]) -------------------------------------------------------------------------------- HPI Details Patient Name: [name], [name] Date of Service: 07/03/2016 12:30 PM Medical Record Patient Account Number: [id number] Number: Treating RN: Montey Hora July 14, 1945 (70 y.o. Other Clinician: Date of Birth/Sex: Female) Treating Layanna Charo Primary Care Physician/Extender: Estanislado Pandy, Cecille Rubin Physician: Referring Physician: Brendia Sacks in Treatment: 4 History of Present Illness HPI Description: The patient is here for followup. Denies fevers, has been compliant with wearing Darco shoes. She states that her blood sugars are still in the low 200s. [name] is a 22F with a h/o DM who presents with an open ulcer on the plantar aspect of her L great toe. She has had it for close to 8 months. She has also had several debridements as well as courses of oral antibiotics. Currently, she denies fevers and has no pain. She has never had ulcers like this in the past. Her blood sugars are normally in the 170s. Foot xray was negative for osteomyelitis. HgbA1c was 10.3. READMISSION; 06/05/16; [name] is a 70 year old type II diabetic by description not well-controlled. She does not have known PAD or claudication. She has apparently some degree of neuropathy or she's been told that in the past. She has a history of a wound on the plantar left foot foot for which she was seen here in 2015 although I was  not involved in her care nor were any other current physicians working in our clinics. She was managed with standard dressings and a Darco forefoot offloading boot and she seems to have healed quite quickly. Apparently the patient noticed a callus/wound development a month ago although her daughter who is present states  it was longer than that. Indeed the patient seems to been followed for at least 3 clinic visits with her podiatrist Dr. Gardiner Barefoot. On October 19 noted a necrotic ulcer on the plantar aspect of the left first metatarsal head. She was using Silvadene based dressings given Keflex on 10/9. I don't believe any cultures or x-rays were done. More importantly she doesn't seem to be adequately offloading this area in her footwear ABIs in this clinic were 0.88 bilaterally. 06/12/16; continued follow-up for a central wound over the left first metatarsal head. Once again today required an extensive debridement. She is using a Darco forefoot offloading boot. Her x-ray of the foot was negative for any bone changes. She has not yet had vascular studies. ABI in this clinic was 0.88 06/19/16; formal arterial studies for early January for her daughter. In general the major wound over the plantar left first metatarsal head looks better. Area on her lateral fifth metatarsal head looks like it's proceeding towards closure. She is wearing the Darco forefoot offloading sandal was some difficulty 06/26/16; formal arterial studies towboat for early January. The area on the plantar left first metatarsal head apparently measures 0.2 x 0.2 mm smaller. She has the same surface slough and some nonviable surrounding tissue which is debrided with a curet. This cleans up quite nicely. The area on her lateral fifth [name]. ([id number]) metatarsal head is still fully epithelialized 07/03/16; formal arterial studies booked for early January. The areas on the plantar left first metatarsal head smaller  by about 0.1 mmo. His is not making quite is much progress as I was like. She is using a Darco forefoot offload her Electronic Signature(s) Signed: 07/03/2016 5:22:07 PM By: Linton Ham MD Entered By: Linton Ham on 07/03/2016 14:27:04 [place], [name] [place] ([id number]) -------------------------------------------------------------------------------- Physical Exam Details Patient Name: [name], [name] Date of Service: 07/03/2016 12:30 PM Medical Record Patient Account Number: [id number] Number: Treating RN: Montey Hora [birth year] (70 y.o. Other Clinician: Date of Birth/Sex: Female) Treating Daltyn Degroat Primary Care Physician/Extender: Estanislado Pandy, Cecille Rubin Physician: Referring Physician: Brendia Sacks in Treatment: 4 Constitutional Patient is hypertensive.. Pulse regular and within target range for patient.Marland Kitchen Respirations regular, non-labored and within target range.. Temperature is normal and within the target range for the patient.. Patient's appearance is neat and clean. Appears in no acute distress. Well nourished and well developed.. Eyes Conjunctivae clear. No discharge.. Cardiovascular Pedal pulses palpable. Notes When exam; plantar aspect of the first metatarsal head. Using a #3 curet once again skin thick subcutaneous tissue and callus removed. She tolerates this well. Hemostasis with direct pressure. There is no evidence of surrounding infection Electronic Signature(s) Signed: 07/03/2016 5:22:07 PM By: Linton Ham MD Entered By: Linton Ham on 07/03/2016 14:29:02 [place], [place] L. ([id number]) -------------------------------------------------------------------------------- Physician Orders Details Patient Name: [name], [name] Date of Service: 07/03/2016 12:30 PM Medical Record Patient Account Number: [id number] Number: Treating RN: Montey Hora [birth year] (70 y.o. Other Clinician: Date of Birth/Sex: Female) Treating Johnwesley Lederman,  Srinidhi Landers Primary Care Physician/Extender: Estanislado Pandy, Cecille Rubin Physician: Referring Physician: Brendia Sacks in Treatment: 4 Verbal / Phone Orders: Yes Clinician: Montey Hora Read Back and Verified: Yes Diagnosis Coding Wound Cleansing Wound #2 Peculiar wound with Normal Saline. Wound #3 Left,Lateral Foot o Clean wound with Normal Saline. Anesthetic Wound #2 Left,Plantar Foot o Topical Lidocaine 4% cream applied to wound bed prior to debridement Wound #3 Left,Lateral Foot o Topical Lidocaine 4% cream applied to wound bed prior to debridement  Primary Wound Dressing Wound #2 Left,Plantar Foot o Prisma Ag Wound #3 Left,Lateral Foot o Foam Secondary Dressing Wound #2 Left,Plantar Foot o ABD and Kerlix/Conform - secure lightly with coban Wound #3 Left,Lateral Foot o ABD and Kerlix/Conform - secure lightly with coban Dressing Change Frequency Wound #2 Left,Plantar Foot o Change dressing every other day. Wound #3 Left,Lateral Foot Friedly, Laquitha L. ([id number]) o Change dressing every other day. Follow-up Appointments Wound #2 [place] o Return Appointment in 1 week. Wound #3 Left,Lateral Foot o Return Appointment in 1 week. Off-Loading Wound #2 Left,Plantar Foot o Other: - front-offloader Wound #3 Left,Lateral Foot o Other: - front-offloader Additional Orders / Instructions Wound #2 Left,Plantar Foot o Increase protein intake. Wound #3 Left,Lateral Foot o Increase protein intake. Electronic Signature(s) Signed: 07/03/2016 5:22:07 PM By: Linton Ham MD Signed: 07/03/2016 5:43:35 PM By: Montey Hora Entered By: Montey Hora on 07/03/2016 13:06:23 [place], [place] ([id number]) -------------------------------------------------------------------------------- Problem List Details Patient Name: [name], [name] Date of Service: 07/03/2016 12:30 PM Medical Record Patient Account Number:  [id number] Number: Treating RN: Montey Hora 1946-01-02 (70 y.o. 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 ([id number]) -------------------------------------------------------------------------------- Progress Note Details Patient Name: [name], [name] Date of Service: 07/03/2016 12:30 PM Medical Record Patient Account Number: [id number] Number: Treating RN: Montey Hora 1945-12-20 (70 y.o. Other Clinician: Date of Birth/Sex: Female) Treating Keinan Brouillet Primary Care Physician/Extender: Ashok Croon Physician: Referring Physician: Brendia Sacks in Treatment: 4 Subjective Chief Complaint Information obtained from Patient 06/05/16; patient is here for review of a wound on her right foot which is been present for at least a month History of Present Illness (HPI) The patient is here for followup. Denies fevers, has been compliant with wearing Darco shoes. She states that her blood sugars are still in the low 200s. Kassia is a 62F with a h/o DM who presents with an open ulcer on the plantar aspect of her L great toe. She has had it for close to 8 months. She has also had several debridements as well as courses of oral antibiotics. Currently, she denies fevers and has no pain. She has never had ulcers like this in the past. Her blood sugars are normally in the 170s. Foot xray was negative  for osteomyelitis. HgbA1c was 10.3. READMISSION; 06/05/16; Mrs. Kashani is a 70 year old type II diabetic by description not well-controlled. She does not have known PAD or claudication. She has apparently some degree of neuropathy or she's been told that in the past. She has a history of a wound on the plantar left foot foot for which she was seen here in 2015 although I was not involved in her care nor were any other current physicians working in our clinics. She was managed with standard dressings and a Darco forefoot offloading boot and she seems to have healed quite quickly. Apparently the patient noticed a callus/wound development a month ago although her daughter who is present states it was longer than that. Indeed the patient seems to been followed for at least 3 clinic visits  with her podiatrist Dr. Gardiner Barefoot. On October 19 noted a necrotic ulcer on the plantar aspect of the left first metatarsal head. She was using Silvadene based dressings given Keflex on 10/9. I don't believe any cultures or x-rays were done. More importantly she doesn't seem to be adequately offloading this area in her footwear ABIs in this clinic were 0.88 bilaterally. 06/12/16; continued follow-up for a central wound over the left first metatarsal head. Once again today required an extensive debridement. She is using a Darco forefoot offloading boot. Her x-ray of the foot was negative for any bone changes. She has not yet had vascular studies. ABI in this clinic was 0.88 [place], [place]. ([id number]) 06/19/16; formal arterial studies for early January for her daughter. In general the major wound over the plantar left first metatarsal head looks better. Area on her lateral fifth metatarsal head looks like it's proceeding towards closure. She is wearing the Darco forefoot offloading sandal was some difficulty 06/26/16; formal arterial studies towboat for early January. The area on the plantar left first  metatarsal head apparently measures 0.2 x 0.2 mm smaller. She has the same surface slough and some nonviable surrounding tissue which is debrided with a curet. This cleans up quite nicely. The area on her lateral fifth metatarsal head is still fully epithelialized 07/03/16; formal arterial studies booked for early January. The areas on the plantar left first metatarsal head smaller by about 0.1 mm. His is not making quite is much progress as I was like. She is using a Darco forefoot offload her Objective Constitutional Patient is hypertensive.. Pulse regular and within target range for patient.Marland Kitchen Respirations regular, non-labored and within target range.. Temperature is normal and within the target range for the patient.. Patient's appearance is neat and clean. Appears in no acute distress. Well nourished and well developed.. Vitals Time Taken: 12:40 PM, Height: 62 in, Weight: 140 lbs, BMI: 25.6, Temperature: 98.1 F, Pulse: 78 bpm, Respiratory Rate: 18 breaths/min, Blood Pressure: 174/69 mmHg. Eyes Conjunctivae clear. No discharge.. Cardiovascular Pedal pulses palpable. General Notes: When exam; plantar aspect of the first metatarsal head. Using a #3 curet once again skin thick subcutaneous tissue and callus removed. She tolerates this well. Hemostasis with direct pressure. There is no evidence of surrounding infection Integumentary (Hair, Skin) Wound #2 status is Open. Original cause of wound was Gradually Appeared. The wound is located on the [place]. The wound measures 0.9cm length x 0.9cm width x 0.2cm depth; 0.636cm^2 area and 0.127cm^3 volume. There is fat exposed. There is no tunneling or undermining noted. There is a large amount of serous drainage noted. The wound margin is epibole. There is large (67-100%) red granulation within the wound bed. There is a small (1-33%) amount of necrotic tissue within the wound bed including Eschar. The periwound skin appearance  exhibited: Callus. The periwound skin appearance did not exhibit: Crepitus, Excoriation, Fluctuance, Friable, Induration, Localized Edema, Rash, Scarring, Dry/Scaly, Maceration, Moist, Atrophie Blanche, Cyanosis, Ecchymosis, Hemosiderin Staining, Mottled, Pallor, Rubor, Erythema. Periwound temperature was noted as No Abnormality. Wound #3 status is Open. Original cause of wound was Blister. The wound is located on the [place], [name] ([id number]) Foot. The wound measures 0.1cm length x 0.1cm width x 0.1cm depth; 0.008cm^2 area and 0.001cm^3 volume. The wound is limited to skin breakdown. There is no tunneling or undermining noted. There is a none present amount of drainage noted. The wound margin is flat and intact. There is no granulation within the wound bed. There  is no necrotic tissue within the wound bed. The periwound skin appearance exhibited: Maceration. Assessment Active Problems ICD-10 E11.621 - Type 2 diabetes mellitus with foot ulcer L97.522 - Non-pressure chronic ulcer of other part of left foot with fat layer exposed Procedures Wound #2 Wound #2 is a Diabetic Wound/Ulcer of the Lower Extremity located on the Left,Plantar Foot . There was a Skin/Subcutaneous Tissue Debridement ([id number]) debridement with total area of 0.81 sq cm performed by Ricard Dillon, MD. with the following instrument(s): Curette to remove Viable and Non-Viable tissue/material including Fibrin/Slough, Callus, and Subcutaneous after achieving pain control using Lidocaine 4% Topical Solution. A time out was conducted at 13:01, prior to the start of the procedure. A Moderate amount of bleeding was controlled with Silver Nitrate. The procedure was tolerated well with a pain level of 0 throughout and a pain level of 0 following the procedure. Post Debridement Measurements: 0.9cm length x 0.9cm width x 0.2cm depth; 0.127cm^3 volume. Character of Wound/Ulcer Post Debridement is improved.  Severity of Tissue Post Debridement is: Fat layer exposed. Post procedure Diagnosis Wound #2: Same as Pre-Procedure Plan Wound Cleansing: Wound #2 Left,Plantar Foot: Clean wound with Normal Saline. Wound #3 Left,Lateral Foot: Clean wound with Normal Saline. [name], [name] ([id number]) Anesthetic: Wound #2 Left,Plantar Foot: Topical Lidocaine 4% cream applied to wound bed prior to debridement Wound #3 Left,Lateral Foot: Topical Lidocaine 4% cream applied to wound bed prior to debridement Primary Wound Dressing: Wound #2 Left,Plantar Foot: Prisma Ag Wound #3 Left,Lateral Foot: Foam Secondary Dressing: Wound #2 Left,Plantar Foot: ABD and Kerlix/Conform - secure lightly with coban Wound #3 Left,Lateral Foot: ABD and Kerlix/Conform - secure lightly with coban Dressing Change Frequency: Wound #2 Left,Plantar Foot: Change dressing every other day. Wound #3 Left,Lateral Foot: Change dressing every other day. Follow-up Appointments: Wound #2 Left,Plantar Foot: Return Appointment in 1 week. Wound #3 Left,Lateral Foot: Return Appointment in 1 week. Off-Loading: Wound #2 Left,Plantar Foot: Other: - front-offloader Wound #3 Left,Lateral Foot: Other: - front-offloader Additional Orders / Instructions: Wound #2 Left,Plantar Foot: Increase protein intake. Wound #3 Left,Lateral Foot: Increase protein intake. changed to silver collagen foam consider TCC next week if no further improvement Electronic Signature(s) Signed: 07/03/2016 5:22:07 PM By: Linton Ham MD Entered By: Linton Ham on 07/03/2016 14:30:05 [place], [name] ([id number]) [place], [place] ([id number]) -------------------------------------------------------------------------------- SuperBill Details Patient Name: Mooty, Minah L. Date of Service: 07/03/2016 Medical Record Patient Account Number: [id number] Number: Treating RN: Montey Hora [birth year] (70 y.o. Other Clinician: Date of  Birth/Sex: Female) Treating Jadea Shiffer Primary Care Physician/Extender: Ashok Croon Physician: Weeks in Treatment: 4 Referring Physician: Lanier Prude Diagnosis Coding ICD-10 Codes Code Description E11.621 Type 2 diabetes mellitus with foot ulcer L97.522 Non-pressure chronic ulcer of other part of left foot with fat layer exposed Facility Procedures CPT4 Code Description: [id number] 11042 - DEB SUBQ TISSUE 20 SQ CM/< ICD-10 Description Diagnosis E11.621 Type 2 diabetes mellitus with foot ulcer L97.522 Non-pressure chronic ulcer of other part of left foot Modifier: with fat lay Quantity: 1 er exposed Physician Procedures CPT4 Code Description: [id number] - WC PHYS SUBQ TISS 20 SQ CM ICD-10 Description Diagnosis E11.621 Type 2 diabetes mellitus with foot ulcer L97.522 Non-pressure chronic ulcer of other part of left foot Modifier: with fat laye Quantity: 1 r exposed Electronic Signature(s) Signed: 07/03/2016 5:22:07 PM By: Linton Ham MD Entered By: Linton Ham on 07/03/2016 14:30:32

## 2016-07-04 NOTE — Progress Notes (Signed)
[name] ([id number]) Visit Report for 07/03/2016 Arrival Information Details Patient Name: [name], [name]. Date of Service: 07/03/2016 12:30 PM Medical Record Patient Account Number: [id number] Number: Treating RN: Montey Hora [birth year] (70 y.o. Other Clinician: Date of Birth/Sex: Female) Treating ROBSON, MICHAEL Primary Care Physician/Extender: Estanislado Pandy, Cecille Rubin Physician: Referring Physician: Brendia Sacks in Treatment: 4 Visit Information History Since Last Visit Added or deleted any medications: No Patient Arrived: Ambulatory Any new allergies or adverse reactions: No Arrival Time: 12:36 Had a fall or experienced change in No Accompanied By: self activities of daily living that may affect Transfer Assistance: None risk of falls: Patient Identification Verified: Yes Signs or symptoms of abuse/neglect since last No Secondary Verification Process Yes visito Completed: Hospitalized since last visit: No Patient Has Alerts: Yes Pain Present Now: No Patient Alerts: DMII Electronic Signature(s) Signed: 07/03/2016 5:43:35 PM By: Montey Hora Entered By: Montey Hora on 07/03/2016 12:37:12 [place], [place]. ([id number]) -------------------------------------------------------------------------------- Encounter Discharge Information Details Patient Name: [name]. Date of Service: 07/03/2016 12:30 PM Medical Record Patient Account Number: [id number] Number: Treating RN: Montey Hora 1946-03-04 (70 y.o. Other Clinician: Date of Birth/Sex: Female) Treating ROBSON, MICHAEL Primary Care Physician/Extender: Estanislado Pandy, Cecille Rubin Physician: Referring Physician: Brendia Sacks in Treatment: 4 Encounter Discharge Information Items Discharge Pain Level: 0 Discharge Condition: Stable Ambulatory Status: Ambulatory Discharge Destination: Home Transportation: Private Auto Accompanied By: self Schedule Follow-up Appointment:  Yes Medication Reconciliation completed and provided to Patient/Care No Melissa Pulido: Provided on Clinical Summary of Care: 07/03/2016 Form Type Recipient Paper Patient BR Electronic Signature(s) Signed: 07/03/2016 1:30:48 PM By: Montey Hora Previous Signature: 07/03/2016 1:17:57 PM Version By: Ruthine Dose Entered By: Montey Hora on 07/03/2016 13:30:48 [name], [name]. ([id number]) -------------------------------------------------------------------------------- Multi Wound Chart Details Patient Name: [name]. Date of Service: 07/03/2016 12:30 PM Medical Record Patient Account Number: [id number] Number: Treating RN: Montey Hora 1945-09-25 (70 y.o. 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 Secondary Etiology: Pressure Ulcer Diabetic Wound/Ulcer of N/A the Lower Extremity Comorbid History: Hypertension, Type II Hypertension, Type II N/A Diabetes Diabetes Date Acquired: 03/25/2016 06/03/2016 N/A Weeks of Treatment: 4 4 N/A Wound Status: Open Open N/A Measurements [name] x W x D 0.9x0.9x0.2 0.1x0.1x0.1 N/A (cm) Area (cm) : 0.636 0.008 N/A Volume (cm) : 0.127 0.001 N/A % Reduction in Area: -15.60% 98.90% N/A % Reduction in Volume: 53.80% 98.60% N/A Classification: Grade 2 Category/Stage I N/A HBO Classification: N/A Grade 1 N/A Exudate Amount: Large None Present N/A [name], [name]. ([id number]) Exudate Type: Serous N/A  N/A Exudate Color: amber N/A N/A Wound Margin: Epibole Flat and Intact N/A Granulation Amount: Large (67-100%) None Present (0%) N/A Granulation Quality: Red N/A N/A Necrotic Amount: Small (1-33%) None Present (0%) N/A Necrotic Tissue: Eschar N/A N/A Exposed Structures: Fat: Yes Fascia: No N/A Fascia: No Fat: No Tendon: No Tendon: No Muscle: No Muscle: No Joint: No Joint: No Bone: No Bone: No Limited to Skin Breakdown Epithelialization: Small (1-33%) Large (67-100%) N/A Debridement: Debridement ([id number]- N/A N/A 11047) Pre-procedure 13:01 N/A N/A Verification/Time Out Taken: Pain Control: Lidocaine 4% Topical  N/A N/A Solution Tissue Debrided: Fibrin/Slough, Callus, N/A N/A Subcutaneous Level: Skin/Subcutaneous N/A N/A Tissue Debridement Area (sq 0.81 N/A N/A cm): Instrument: Curette N/A N/A Bleeding: Moderate N/A N/A Hemostasis Achieved: Silver Nitrate N/A N/A Procedural Pain: 0 N/A N/A Post Procedural Pain: 0 N/A N/A Debridement Treatment Procedure was tolerated N/A N/A Response: well Post Debridement 0.9x0.9x0.2 N/A N/A Measurements [name] x W x D (cm) Post Debridement 0.127 N/A N/A Volume: (cm) Periwound Skin Texture: Callus: Yes No Abnormalities Noted N/A Edema: No Excoriation: No Induration: No Crepitus: No Fluctuance: No Friable: No Rash: No Scarring: No [name], [name]. ([id number]) Periwound Skin Maceration: No Maceration: Yes N/A Moisture: Moist: No Dry/Scaly: No Periwound Skin Color: Atrophie Blanche: No No Abnormalities Noted N/A Cyanosis: No Ecchymosis: No Erythema: No Hemosiderin Staining: No Mottled: No Pallor: No Rubor: No Temperature: No Abnormality N/A N/A Tenderness on No No N/A Palpation: Wound Preparation: Ulcer Cleansing: Ulcer Cleansing: N/A Rinsed/Irrigated with Rinsed/Irrigated with Saline Saline Topical Anesthetic Topical Anesthetic Applied: Other: lidocaine Applied: None 4% Procedures Performed: Debridement N/A  N/A Treatment Notes Wound #2 (Left, Plantar Foot) 1. Cleansed with: Clean wound with Normal Saline 2. Anesthetic Topical Lidocaine 4% cream to wound bed prior to debridement 4. Dressing Applied: Prisma Ag 5. Secondary Dressing Applied Dry Gauze Foam Kerlix/Conform 7. Secured with Tape Notes secured lightly with coban Wound #3 (Left, Lateral Foot) 4. Dressing Applied: Foam 5. Secondary Dressing Applied Kerlix/Conform Notes secured lightly with AFUA, HOOTS ([id number]) Electronic Signature(s) Signed: 07/03/2016 5:22:07 PM By: Linton Ham MD Entered By: Linton Ham on 07/03/2016 14:25:03 [place], [place] ([id number]) -------------------------------------------------------------------------------- [place] Details Patient Name: Kuna, Winna [name]. Date of Service: 07/03/2016 12:30 PM Medical Record Patient Account Number: [id number] Number: Treating RN: Montey Hora [birth year] (70 y.o. Other Clinician: Date of Birth/Sex: Female) Treating ROBSON, MICHAEL Primary Care Physician/Extender: Estanislado Pandy, Cecille Rubin Physician: Referring Physician: Brendia Sacks in Treatment: 4 Active Inactive Medication Nursing Diagnoses: Knowledge deficit related to medication safety: actual or potential Goals: Patient/caregiver will demonstrate understanding of new oral/IV medications prescribed at the [facility] (topical prescriptions are covered under the skin breakdown problem) Date Initiated: 06/05/2016 Goal Status: Active Interventions: Assess patient/caregiver ability to manage medication regimen upon admission and as needed Patient/Caregiver given reconciled medication list upon admission, changes in medications and discharge from the [facility] Notes: Nutrition Nursing Diagnoses: Potential for alteratiion in Nutrition/Potential for imbalanced nutrition Goals: Patient/caregiver verbalizes understanding of need to maintain therapeutic  glucose control per primary care physician Date Initiated: 06/05/2016 Goal Status: Active Interventions: Provide education on elevated blood sugars and impact on wound healing Notes: Orientation to the [place], [place]. ([id number]) Nursing Diagnoses: Knowledge deficit related to the wound healing center program Goals: Patient/caregiver will verbalize understanding of the [place] [facility] Date Initiated: 06/05/2016 Goal Status: Active Interventions: Provide education on orientation to the wound center Notes: Pressure Nursing Diagnoses: Knowledge deficit related to management of pressures ulcers Goals: Patient will remain free of pressure ulcers Date Initiated: 06/05/2016 Goal Status: Active Interventions: Assess: immobility, friction, shearing, incontinence upon admission and as needed Treatment Activities: Patient referred for seating evaluation to ensure proper offloading : 06/05/2016 Notes: Wound/Skin Impairment Nursing Diagnoses: Impaired tissue integrity Goals: Ulcer/skin breakdown will heal within 14 weeks Date Initiated: 06/05/2016 Goal Status: Active Interventions: Assess patient/caregiver ability to obtain necessary supplies Notes: LUCIE, FRIED[place] ([id number]) Electronic Signature(s) Signed: 07/03/2016 5:43:35 PM By: Montey Hora Entered By: Montey Hora on 07/03/2016 13:03:40 [place], Nyleah [name]. ([id number]) --------------------------------------------------------------------------------  Pain Assessment Details Patient Name: Balentine, Kailiana [name]. Date of Service: 07/03/2016 12:30 PM Medical Record Patient Account Number: [id number] Number: Treating RN: Montey Hora 1946-05-19 (70 y.o. Other Clinician: Date of Birth/Sex: Female) Treating ROBSON, MICHAEL Primary Care Physician/Extender: Estanislado Pandy, Cecille Rubin Physician: Referring Physician: Brendia Sacks in Treatment: 4 Active Problems Location of Pain Severity  and Description of Pain Patient Has Paino No Site Locations Pain Management and Medication Current Pain Management: Notes Topical or injectable lidocaine is offered to patient for acute pain when surgical debridement is performed. If needed, Patient is instructed to use over the counter pain medication for the following 24-48 hours after debridement. Wound care MDs do not prescribed pain medications. Patient has chronic pain or uncontrolled pain. Patient has been instructed to make an appointment with their Primary Care Physician for pain management. Electronic Signature(s) Signed: 07/03/2016 5:43:35 PM By: Montey Hora Entered By: Montey Hora on 07/03/2016 12:37:52 [place], [name] ([id number]) -------------------------------------------------------------------------------- Patient/Caregiver Education Details Patient Name: Puett, [name]. Date of Service: 07/03/2016 12:30 PM Medical Record Patient Account Number: [id number] Number: Treating RN: Montey Hora [birth year] (70 y.o. Other Clinician: Date of Birth/Gender: Female) Treating ROBSON, MICHAEL Primary Care Physician/Extender: Ashok Croon Physician: Weeks in Treatment: 4 Referring Physician: Lanier Prude Education Assessment Education Provided To: Patient Education Topics Provided Wound/Skin Impairment: Handouts: Other: wound care as ordered Methods: Demonstration, Explain/Verbal Responses: State content correctly Electronic Signature(s) Signed: 07/03/2016 5:43:35 PM By: Montey Hora Entered By: Montey Hora on 07/03/2016 13:31:05 [name], [name]. ([id number]) -------------------------------------------------------------------------------- Wound Assessment Details Patient Name: [name], [name]. Date of Service: 07/03/2016 12:30 PM Medical Record Patient Account Number: [id number] Number: Treating RN: Montey Hora 03/17/1946 (70 y.o. Other Clinician: Date of  Birth/Sex: Female) Treating ROBSON, MICHAEL Primary Care Physician/Extender: Estanislado Pandy, Cecille Rubin Physician: Referring Physician: Brendia Sacks in Treatment: 4 Wound Status Wound Number: 2 Primary Etiology: Diabetic Wound/Ulcer of the Lower Extremity Wound Location: Left Foot - Plantar Secondary Pressure Ulcer Wounding Event: Gradually Appeared Etiology: Date Acquired: 03/25/2016 Wound Status: Open Weeks Of Treatment: 4 Comorbid Hypertension, Type II Diabetes Clustered Wound: No History: Photos Wound Measurements Length: (cm) 0.9 % Reduction in Area Width: (cm) 0.9 % Reduction in Volu Depth: (cm) 0.2 Epithelialization: Area: (cm) 0.636 Tunneling: Volume: (cm) 0.127 Undermining: : -15.6% me: 53.8% Small (1-33%) No No Wound Description Classification: Grade 2 Foul Odor After Cle Wound Margin: Epibole Exudate Amount: Large Exudate Type: Serous Exudate Color: amber ansing: No Wound Bed Granulation Amount: Large (67-100%) Exposed Structure Granulation Quality: Red Fascia Exposed: No [name], [name]. ([id number]) Necrotic Amount: Small (1-33%) Fat Layer Exposed: Yes Necrotic Quality: Eschar Tendon Exposed: No Muscle Exposed: No Joint Exposed: No Bone Exposed: No Periwound Skin Texture Texture Color No Abnormalities Noted: No No Abnormalities Noted: No Callus: Yes Atrophie Blanche: No Crepitus: No Cyanosis: No Excoriation: No Ecchymosis: No Fluctuance: No Erythema: No Friable: No Hemosiderin Staining: No Induration: No Mottled: No Localized Edema: No Pallor: No Rash: No Rubor: No Scarring: No Temperature / Pain Moisture Temperature: No Abnormality No Abnormalities Noted: No Dry / Scaly: No Maceration: No Moist: No Wound Preparation Ulcer Cleansing: Rinsed/Irrigated with Saline Topical Anesthetic Applied: Other: lidocaine 4%, Treatment Notes Wound #2 (Left, Plantar Foot) 1. Cleansed with: Clean wound with Normal Saline 2.  Anesthetic Topical Lidocaine 4% cream to wound bed prior to debridement 4. Dressing Applied: Prisma Ag 5. Secondary Dressing Applied Dry Gauze Foam Kerlix/Conform 7. Secured with Tape Notes secured lightly with coban Electronic Signature(s) Clemence, Ronika  Carlean Jews ([id number]) Signed: 07/03/2016 5:43:35 PM By: Montey Hora Entered By: Montey Hora on 07/03/2016 13:02:58 [name], [name]. ([id number]) -------------------------------------------------------------------------------- Wound Assessment Details Patient Name: [name], [name]. Date of Service: 07/03/2016 12:30 PM Medical Record Patient Account Number: [id number] Number: Treating RN: Montey Hora 07-11-1945 (70 y.o. Other Clinician: Date of Birth/Sex: Female) Treating ROBSON, MICHAEL Primary Care Physician/Extender: Estanislado Pandy, Cecille Rubin Physician: Referring Physician: Brendia Sacks in Treatment: 4 Wound Status Wound Number: 3 Primary Etiology: Pressure Ulcer Wound Location: Left Foot - Lateral Secondary Diabetic Wound/Ulcer of the Lower Etiology: Extremity Wounding Event: Blister Wound Status: Open Date Acquired: 06/03/2016 Comorbid Hypertension, Type II Diabetes Weeks Of Treatment: 4 History: Clustered Wound: No Photos Wound Measurements Length: (cm) 0.1 Width: (cm) 0.1 Depth: (cm) 0.1 Area: (cm) 0.008 Volume: (cm) 0.001 % Reduction in Area: 98.9% % Reduction in Volume: 98.6% Epithelialization: Large (67-100%) Tunneling: No Undermining: No Wound Description Classification: Category/Stage I Diabetic Severity Earleen Newport): Grade 1 Wound Margin: Flat and Intact Exudate Amount: None Present Wound Bed Granulation Amount: None Present (0%) Exposed Structure Necrotic Amount: None Present (0%) Fascia Exposed: No Fat Layer Exposed: No Tendon Exposed: No [name], [name]. ([id number]) Muscle Exposed: No Joint Exposed: No Bone Exposed: No Limited to Skin Breakdown Periwound Skin  Texture Texture Color No Abnormalities Noted: No No Abnormalities Noted: No Moisture No Abnormalities Noted: No Maceration: Yes Wound Preparation Ulcer Cleansing: Rinsed/Irrigated with Saline Topical Anesthetic Applied: None Treatment Notes Wound #3 (Left, Lateral Foot) 4. Dressing Applied: Foam 5. Secondary Dressing Applied Kerlix/Conform Notes secured lightly with coban Electronic Signature(s) Signed: 07/03/2016 5:43:35 PM By: Montey Hora Entered By: Montey Hora on 07/03/2016 13:03:21 [name], [name]. ([id number]) -------------------------------------------------------------------------------- Vitals Details Patient Name: [name], [name]. Date of Service: 07/03/2016 12:30 PM Medical Record Patient Account Number: [id number] Number: Treating RN: Montey Hora 1945/12/27 (70 y.o. Other Clinician: Date of Birth/Sex: Female) Treating ROBSON, MICHAEL Primary Care Physician/Extender: Estanislado Pandy, Cecille Rubin Physician: Referring Physician: Brendia Sacks in Treatment: 4 Vital Signs Time Taken: 12:40 Temperature (F): 98.1 Height (in): 62 Pulse (bpm): 78 Weight (lbs): 140 Respiratory Rate (breaths/min): 18 Body Mass Index (BMI): 25.6 Blood Pressure (mmHg): 174/69 Reference Range: 80 - 120 mg / dl Electronic Signature(s) Signed: 07/03/2016 5:43:35 PM By: Montey Hora Entered By: Montey Hora on 07/03/2016 12:40:53

## 2016-07-10 ENCOUNTER — Ambulatory Visit: Payer: Medicare Other

## 2016-07-10 ENCOUNTER — Encounter: Payer: Medicare Other | Attending: Internal Medicine | Admitting: Internal Medicine

## 2016-07-10 DIAGNOSIS — L97521 Non-pressure chronic ulcer of other part of left foot limited to breakdown of skin: Secondary | ICD-10-CM

## 2016-07-10 DIAGNOSIS — E11621 Type 2 diabetes mellitus with foot ulcer: Secondary | ICD-10-CM | POA: Insufficient documentation

## 2016-07-10 DIAGNOSIS — L97522 Non-pressure chronic ulcer of other part of left foot with fat layer exposed: Secondary | ICD-10-CM | POA: Diagnosis not present

## 2016-07-11 NOTE — Progress Notes (Signed)
IZZIE, GEERS ([id number]) Visit Report for 07/10/2016 Arrival Information Details Patient Name: [name], [name] Date of Service: 07/10/2016 12:30 PM Medical Record Patient Account Number: [id number] Number: Treating RN: Montey Hora 20-Mar-1946 (70 y.o. Other Clinician: Date of Birth/Sex: Female) Treating ROBSON, MICHAEL Primary Care Physician/Extender: Estanislado Pandy, Cecille Rubin Physician: Referring Physician: Brendia Sacks in Treatment: 5 Visit Information History Since Last Visit Added or deleted any medications: No Patient Arrived: Ambulatory Any new allergies or adverse reactions: No Arrival Time: 12:37 Had a fall or experienced change in No Accompanied By: dtr activities of daily living that may affect Transfer Assistance: None risk of falls: Patient Identification Verified: Yes Signs or symptoms of abuse/neglect since last No Secondary Verification Process Yes visito Completed: Hospitalized since last visit: No Patient Has Alerts: Yes Pain Present Now: No Patient Alerts: DMII Electronic Signature(s) Signed: 07/10/2016 6:00:22 PM By: Montey Hora Entered By: Montey Hora on 07/10/2016 12:38:38 Standard, Keshia L. ([id number]) -------------------------------------------------------------------------------- Encounter Discharge Information Details Patient Name: [name], [name] Date of Service: 07/10/2016 12:30 PM Medical Record Patient Account Number: [id number] Number: Treating RN: Montey Hora 15-Sep-1945 (70 y.o. Other Clinician: Date of Birth/Sex: Female) Treating ROBSON, MICHAEL Primary Care Physician/Extender: Estanislado Pandy, Cecille Rubin Physician: Referring Physician: Brendia Sacks in Treatment: 5 Encounter Discharge Information Items Discharge Pain Level: 0 Discharge Condition: Stable Ambulatory Status: Ambulatory Discharge Destination: Home Transportation: Private Auto Accompanied By: dtr Schedule Follow-up Appointment: Yes Medication  Reconciliation completed and provided to Patient/Care No Gurdeep Keesey: Provided on Clinical Summary of Care: 07/10/2016 Form Type Recipient Paper Patient BR Electronic Signature(s) Signed: 07/10/2016 2:14:04 PM By: Montey Hora Previous Signature: 07/10/2016 2:02:15 PM Version By: Ruthine Dose Entered By: Montey Hora on 07/10/2016 14:14:04 [place], [place] ([id number]) -------------------------------------------------------------------------------- Lower Extremity Assessment Details Patient Name: [name], [name] Date of Service: 07/10/2016 12:30 PM Medical Record Patient Account Number: [id number] Number: Treating RN: Montey Hora Dec 01, 1945 (70 y.o. Other Clinician: Date of Birth/Sex: Female) Treating ROBSON, MICHAEL Primary Care Physician/Extender: Estanislado Pandy, Cecille Rubin Physician: Referring Physician: Brendia Sacks in Treatment: 5 Edema Assessment Assessed: [Left: No] [Right: No] Edema: [Left: N] [Right: o] Vascular Assessment Pulses: Dorsalis Pedis Palpable: [Left:Yes] Posterior Tibial Extremity colors, hair growth, and conditions: Extremity Color: [Left:Normal] Hair Growth on Extremity: [Left:No] Temperature of Extremity: [Left:Warm] Capillary Refill: [Left:< 3 seconds] Electronic Signature(s) Signed: 07/10/2016 6:00:22 PM By: Montey Hora Entered By: Montey Hora on 07/10/2016 12:48:26 [place], [place] ([id number]) -------------------------------------------------------------------------------- Multi Wound Chart Details Patient Name: [name], [name] Date of Service: 07/10/2016 12:30 PM Medical Record Patient Account Number: [id number] Number: Treating RN: Montey Hora Mar 24, 1946 (70 y.o. 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 Secondary Etiology: Pressure Ulcer Diabetic Wound/Ulcer of N/A the Lower Extremity Comorbid History: Hypertension, Type II Hypertension, Type II N/A Diabetes Diabetes Date Acquired: 03/25/2016 06/03/2016 N/A Weeks of Treatment: 5 5 N/A Wound Status: Open Open N/A Measurements L x W x D 0.6x0.5x0.3 0.1x0.1x0.1 N/A (cm) Area (cm) : 0.236 0.008 N/A Volume (cm) : 0.071 0.001 N/A % Reduction in Area: 57.10% 98.90% N/A % Reduction in Volume: 74.20% 98.60% N/A Classification:  Grade 2 Category/Stage I N/A HBO Classification: N/A Grade 1 N/A Exudate Amount: Large None Present N/A [name], [name] ([id number]) Exudate Type: Serous N/A N/A Exudate Color: amber N/A N/A Wound Margin: Epibole Flat and Intact N/A Granulation Amount: Large (67-100%) None Present (0%) N/A Granulation Quality: Red N/A N/A Necrotic Amount: Small (1-33%) None Present (0%) N/A Necrotic Tissue: Eschar N/A N/A Exposed Structures: Fat: Yes Fascia: No N/A Fascia: No Fat: No Tendon: No Tendon: No Muscle: No Muscle: No Joint: No Joint: No Bone: No Bone: No Limited to Skin Breakdown Epithelialization: Small (1-33%) Large (67-100%) N/A Debridement: Debridement (03500- N/A N/A 11047) Pre-procedure 13:05 N/A N/A Verification/Time Out Taken: Pain Control: Lidocaine 4% Topical N/A N/A Solution Tissue Debrided: Fibrin/Slough, Callus, N/A N/A Subcutaneous Level: Skin/Subcutaneous N/A N/A Tissue Debridement Area (sq 0.3 N/A N/A cm): Instrument: Blade, Curette, Forceps N/A N/A Bleeding: Minimum N/A N/A Hemostasis Achieved: Pressure N/A N/A Procedural Pain: 0 N/A N/A Post Procedural Pain: 0  N/A N/A Debridement Treatment Procedure was tolerated N/A N/A Response: well Post Debridement 0.6x0.5x0.4 N/A N/A Measurements L x W x D (cm) Post Debridement 0.094 N/A N/A Volume: (cm) Periwound Skin Texture: Callus: Yes No Abnormalities Noted N/A Edema: No Excoriation: No Induration: No Crepitus: No Fluctuance: No Friable: No Rash: No Scarring: No [name], [name] ([id number]) Periwound Skin Maceration: No Maceration: Yes N/A Moisture: Moist: No Dry/Scaly: No Periwound Skin Color: Atrophie Blanche: No No Abnormalities Noted N/A Cyanosis: No Ecchymosis: No Erythema: No Hemosiderin Staining: No Mottled: No Pallor: No Rubor: No Temperature: No Abnormality N/A N/A Tenderness on No No N/A Palpation: Wound Preparation: Ulcer Cleansing: Ulcer Cleansing: N/A Rinsed/Irrigated with Rinsed/Irrigated with Saline Saline Topical Anesthetic Topical Anesthetic Applied: Other: lidocaine Applied: None 4% Procedures Performed: Debridement N/A N/A Treatment Notes Electronic Signature(s) Signed: 07/10/2016 5:37:12 PM By: Linton Ham MD Entered By: Linton Ham on 07/10/2016 14:11:42 [place], [place] ([id number]) -------------------------------------------------------------------------------- [place] Details Patient Name: [name], [name] Date of Service: 07/10/2016 12:30 PM Medical Record Patient Account Number: [id number] Number: Treating RN: Montey Hora 01-10-1946 (70 y.o. Other Clinician: Date of Birth/Sex: Female) Treating ROBSON, MICHAEL Primary Care Physician/Extender: Estanislado Pandy, Cecille Rubin Physician: Referring Physician: Brendia Sacks in Treatment: 5 Active Inactive Medication Nursing Diagnoses: Knowledge deficit related to medication safety: actual or potential Goals: Patient/caregiver will demonstrate understanding of new oral/IV medications prescribed at the [facility] (topical prescriptions are covered under the skin breakdown  problem) Date Initiated: 06/05/2016 Goal Status: Active Interventions: Assess patient/caregiver ability to manage medication regimen upon admission and as needed Patient/Caregiver given reconciled medication list upon admission, changes in medications and discharge from the [place] Notes: Nutrition Nursing Diagnoses: Potential for alteratiion in Nutrition/Potential for imbalanced nutrition Goals: Patient/caregiver verbalizes understanding of need to maintain therapeutic glucose control per primary care physician Date Initiated: 06/05/2016 Goal Status: Active Interventions: Provide education on elevated blood sugars and impact on wound healing Notes: Orientation to the [place], [place]. ([id number]) Nursing Diagnoses: Knowledge deficit related to the wound healing center program Goals: Patient/caregiver will verbalize understanding of the [place] Program Date Initiated: 06/05/2016 Goal Status: Active Interventions: Provide education on orientation to the wound center Notes: Pressure Nursing Diagnoses: Knowledge deficit related to management of pressures ulcers Goals: Patient will remain free of pressure ulcers Date Initiated: 06/05/2016 Goal Status: Active Interventions: Assess: immobility, friction, shearing, incontinence upon admission and as needed Treatment Activities: Patient referred for seating evaluation to ensure proper offloading : 06/05/2016 Notes: Wound/Skin Impairment Nursing Diagnoses: Impaired  tissue integrity Goals: Ulcer/skin breakdown will heal within 14 weeks Date Initiated: 06/05/2016 Goal Status: Active Interventions: Assess patient/caregiver ability to obtain necessary supplies Notes: [name], [name] ([id number]) Electronic Signature(s) Signed: 07/10/2016 6:00:22 PM By: Montey Hora Entered By: Montey Hora on 07/10/2016 12:48:44 [place], Henry  ([id number]) -------------------------------------------------------------------------------- Pain Assessment Details Patient Name: Kovich, Tianna L. Date of Service: 07/10/2016 12:30 PM Medical Record Patient Account Number: [id number] Number: Treating RN: Montey Hora 1945/09/26 (70 y.o. Other Clinician: Date of Birth/Sex: Female) Treating ROBSON, MICHAEL Primary Care Physician/Extender: Estanislado Pandy, Cecille Rubin Physician: Referring Physician: Brendia Sacks in Treatment: 5 Active Problems Location of Pain Severity and Description of Pain Patient Has Paino No Site Locations Pain Management and Medication Current Pain Management: Notes Topical or injectable lidocaine is offered to patient for acute pain when surgical debridement is performed. If needed, Patient is instructed to use over the counter pain medication for the following 24-48 hours after debridement. Wound care MDs do not prescribed pain medications. Patient has chronic pain or uncontrolled pain. Patient has been instructed to make an appointment with their Primary Care Physician for pain management. Electronic Signature(s) Signed: 07/10/2016 6:00:22 PM By: Montey Hora Entered By: Montey Hora on 07/10/2016 12:39:39 Masih, Maggy Carlean Jews ([id number]) -------------------------------------------------------------------------------- Patient/Caregiver Education Details Patient Name: [name], [name] Date of Service: 07/10/2016 12:30 PM Medical Record Patient Account Number: [id number] Number: Treating RN: Montey Hora 24-Nov-1945 (70 y.o. Other Clinician: Date of Birth/Gender: Female) Treating ROBSON, MICHAEL Primary Care Physician/Extender: Ashok Croon Physician: Weeks in Treatment: 5 Referring Physician: Lanier Prude Education Assessment Education Provided To: Patient and Caregiver Education Topics Provided Wound/Skin Impairment: Handouts: Other: TCC precautions Methods: Explain/Verbal,  Printed Responses: State content correctly Electronic Signature(s) Signed: 07/10/2016 6:00:22 PM By: Montey Hora Entered By: Montey Hora on 07/10/2016 14:14:20 [name], [name] ([id number]) -------------------------------------------------------------------------------- Wound Assessment Details Patient Name: [name], [name] Date of Service: 07/10/2016 12:30 PM Medical Record Patient Account Number: [id number] Number: Treating RN: Montey Hora 10-15-1945 (70 y.o. Other Clinician: Date of Birth/Sex: Female) Treating ROBSON, MICHAEL Primary Care Physician/Extender: Estanislado Pandy, Cecille Rubin Physician: Referring Physician: Brendia Sacks in Treatment: 5 Wound Status Wound Number: 2 Primary Etiology: Diabetic Wound/Ulcer of the Lower Extremity Wound Location: Left Foot - Plantar Secondary Pressure Ulcer Wounding Event: Gradually Appeared Etiology: Date Acquired: 03/25/2016 Wound Status: Open Weeks Of Treatment: 5 Comorbid Hypertension, Type II Diabetes Clustered Wound: No History: Photos Wound Measurements Length: (cm) 0.6 % Reduction in Area Width: (cm) 0.5 % Reduction in Volu Depth: (cm) 0.3 Epithelialization: Area: (cm) 0.236 Tunneling: Volume: (cm) 0.071 Undermining: : 57.1% me: 74.2% Small (1-33%) No No Wound Description Classification: Grade 2 Foul Odor After Cle Wound Margin: Epibole Exudate Amount: Large Exudate Type: Serous Exudate Color: amber ansing: No Wound Bed Granulation Amount: Large (67-100%) Exposed Structure Granulation Quality: Red Fascia Exposed: No Nordgren, Noelene L. ([id number]) Necrotic Amount: Small (1-33%) Fat Layer Exposed: Yes Necrotic Quality: Eschar Tendon Exposed: No Muscle Exposed: No Joint Exposed: No Bone Exposed: No Periwound Skin Texture Texture Color No Abnormalities Noted: No No Abnormalities Noted: No Callus: Yes Atrophie Blanche: No Crepitus: No Cyanosis: No Excoriation: No Ecchymosis:  No Fluctuance: No Erythema: No Friable: No Hemosiderin Staining: No Induration: No Mottled: No Localized Edema: No Pallor: No Rash: No Rubor: No Scarring: No Temperature / Pain Moisture Temperature: No Abnormality No Abnormalities Noted: No Dry / Scaly: No Maceration: No Moist: No Wound Preparation Ulcer Cleansing: Rinsed/Irrigated with Saline Topical Anesthetic Applied: Other: lidocaine 4%, Treatment Notes Wound #  2 (Left, Plantar Foot) 1. Cleansed with: Clean wound with Normal Saline 2. Anesthetic Topical Lidocaine 4% cream to wound bed prior to debridement 4. Dressing Applied: Prisma Ag 5. Secondary Dressing Applied Foam 6. Footwear/Offloading device applied Other footwear/offloading device applied (specify in notes) Notes TCC applied by Dr Dellia Nims today Electronic Signature(s) Signed: 07/10/2016 6:00:22 PM By: Montey Hora Entered By: Montey Hora on 07/10/2016 12:47:40 [place], [place] ([id number]) [place], [place] ([id number]) -------------------------------------------------------------------------------- Wound Assessment Details Patient Name: [name], [name] Date of Service: 07/10/2016 12:30 PM Medical Record Patient Account Number: [id number] Number: Treating RN: Montey Hora 02-Aug-1945 (70 y.o. Other Clinician: Date of Birth/Sex: Female) Treating ROBSON, MICHAEL Primary Care Physician/Extender: Estanislado Pandy, Cecille Rubin Physician: Referring Physician: Brendia Sacks in Treatment: 5 Wound Status Wound Number: 3 Primary Etiology: Pressure Ulcer Wound Location: Left Foot - Lateral Secondary Diabetic Wound/Ulcer of the Lower Etiology: Extremity Wounding Event: Blister Wound Status: Open Date Acquired: 06/03/2016 Comorbid Hypertension, Type II Diabetes Weeks Of Treatment: 5 History: Clustered Wound: No Photos Wound Measurements Length: (cm) 0.1 Width: (cm) 0.1 Depth: (cm) 0.1 Area: (cm) 0.008 Volume: (cm) 0.001 % Reduction in Area:  98.9% % Reduction in Volume: 98.6% Epithelialization: Large (67-100%) Tunneling: No Undermining: No Wound Description Classification: Category/Stage I Diabetic Severity Earleen [place]): Grade 1 Wound Margin: Flat and Intact Exudate Amount: None Present Wound Bed Granulation Amount: None Present (0%) Exposed Structure Necrotic Amount: None Present (0%) Fascia Exposed: No Fat Layer Exposed: No Tendon Exposed: No [name], [name] ([id number]) Muscle Exposed: No Joint Exposed: No Bone Exposed: No Limited to Skin Breakdown Periwound Skin Texture Texture Color No Abnormalities Noted: No No Abnormalities Noted: No Moisture No Abnormalities Noted: No Maceration: Yes Wound Preparation Ulcer Cleansing: Rinsed/Irrigated with Saline Topical Anesthetic Applied: None Treatment Notes Wound #3 (Left, Lateral Foot) 1. Cleansed with: Clean wound with Normal Saline 5. Secondary Dressing Applied Foam 6. Footwear/Offloading device applied Other footwear/offloading device applied (specify in notes) Notes TCC applied by Dr Dellia Nims today Electronic Signature(s) Signed: 07/10/2016 6:00:22 PM By: Montey Hora Entered By: Montey Hora on 07/10/2016 12:48:03 [place], [place] ([id number]) -------------------------------------------------------------------------------- [place] Details Patient Name: [place], Dayana L. Date of Service: 07/10/2016 12:30 PM Medical Record Patient Account Number: [id number] Number: Treating RN: Montey Hora 1945-08-27 (70 y.o. Other Clinician: Date of Birth/Sex: Female) Treating ROBSON, MICHAEL Primary Care Physician/Extender: Estanislado Pandy, Cecille Rubin Physician: Referring Physician: Brendia Sacks in Treatment: 5 Vital Signs Time Taken: 12:39 Temperature (F): 98.3 Height (in): 62 Pulse (bpm): 77 Weight (lbs): 140 Respiratory Rate (breaths/min): 18 Body Mass Index (BMI): 25.6 Blood Pressure (mmHg): 165/85 Reference Range: 80 - 120 mg / dl Electronic  Signature(s) Signed: 07/10/2016 6:00:22 PM By: Montey Hora Entered By: Montey Hora on 07/10/2016 12:40:49

## 2016-07-11 NOTE — Progress Notes (Addendum)
[name], [name] ([id number]) Visit Report for 07/10/2016 Chief Complaint Document Details Patient Name: [name], [name] Date of Service: 07/10/2016 12:30 PM Medical Record Patient Account Number: [id number] Number: Treating RN: Montey Hora Aug 08, 1945 (70 y.o. Other Clinician: Date of Birth/Sex: Female) Treating ROBSON, MICHAEL Primary Care Physician/Extender: Ashok Croon Physician: Referring Physician: Brendia Sacks in Treatment: 5 Information Obtained from: Patient Chief Complaint 06/05/16; patient is here for review of a wound on her right foot which is been present for at least a month Electronic Signature(s) Signed: 07/10/2016 5:37:12 PM By: Linton Ham MD Entered By: Linton Ham on 07/10/2016 14:12:11 [place], [place] ([id number]) -------------------------------------------------------------------------------- Debridement Details Patient Name: [name], [name] Date of Service: 07/10/2016 12:30 PM Medical Record Patient Account Number: [id number] Number: Treating RN: Montey Hora Feb 07, 1946 (70 y.o. Other Clinician: Date of Birth/Sex: Female) Treating ROBSON, MICHAEL Primary Care Physician/Extender: Estanislado Pandy, Cecille Rubin Physician: Referring Physician: Brendia Sacks in Treatment: 5 Debridement Performed for Wound #2 Left,Plantar Foot Assessment: Performed By: Physician Ricard Dillon, MD Debridement: Debridement Pre-procedure Yes - 13:05 Verification/Time Out Taken: Start Time: 13:05 Pain Control: Lidocaine 4% Topical Solution Level: Skin/Subcutaneous Tissue Total Area Debrided (L x 0.6 (cm) x 0.5 (cm) = 0.3 (cm) W): Tissue and other Viable, Non-Viable, Callus, Fibrin/Slough, Subcutaneous material debrided: Instrument: Blade, Curette, Forceps Bleeding: Minimum Hemostasis Achieved: Pressure End Time: 13:08 Procedural Pain: 0 Post Procedural Pain: 0 Response to Treatment: Procedure was tolerated well Post Debridement  Measurements of Total Wound Length: (cm) 0.6 Width: (cm) 0.5 Depth: (cm) 0.4 Volume: (cm) 0.094 Character of Wound/Ulcer Post Improved Debridement: Severity of Tissue Post Debridement: Fat layer exposed Post Procedure Diagnosis Same as Pre-procedure Electronic Signature(s) Signed: 07/10/2016 5:37:12 PM By: Linton Ham MD [name] ([id number]) Signed: 07/10/2016 6:00:22 PM By: Montey Hora Entered By: Linton Ham on 07/10/2016 14:12:01 [place], [name] ([id number]) -------------------------------------------------------------------------------- HPI Details Patient Name: [name], [name] Date of Service: 07/10/2016 12:30 PM Medical Record Patient Account Number: [id number] Number: Treating RN: Montey Hora 1945/12/07 (70 y.o. Other Clinician: Date of Birth/Sex: Female) Treating ROBSON, MICHAEL Primary Care Physician/Extender: Estanislado Pandy, Cecille Rubin Physician: Referring Physician: Brendia Sacks in Treatment: 5 History of Present Illness HPI Description: The patient is here for followup. Denies fevers, has been compliant with wearing Darco shoes. She states that her blood sugars are still in the low 200s. [name] is a 42F with a h/o DM who presents with an open ulcer on the plantar aspect of her L great toe. She has had it for close to 8 months. She has also had several debridements as well as courses of oral antibiotics. Currently, she denies fevers and has no pain. She has never had ulcers like this in the past. Her blood sugars are normally in the 170s. Foot xray was negative for osteomyelitis. HgbA1c was 10.3. READMISSION; 06/05/16; [name] is a 71 year old type II diabetic by description not well-controlled. She does not have known PAD or claudication. She has apparently some degree of neuropathy or she's been told that in the past. She has a history of a wound on the plantar left foot foot for which she was seen here in 2015 although I was not  involved in her care nor were any other current physicians working in our clinics. She was managed with standard dressings and a Darco forefoot offloading boot and she seems to have healed quite quickly. Apparently the patient noticed a callus/wound development a month ago although her daughter who is present  states it was longer than that. Indeed the patient seems to been followed for at least 3 clinic visits with her podiatrist Dr. Gardiner Barefoot. On October 19 noted a necrotic ulcer on the plantar aspect of the left first metatarsal head. She was using Silvadene based dressings given Keflex on 10/9. I don't believe any cultures or x-rays were done. More importantly she doesn't seem to be adequately offloading this area in her footwear ABIs in this clinic were 0.88 bilaterally. 06/12/16; continued follow-up for a central wound over the left first metatarsal head. Once again today required an extensive debridement. She is using a Darco forefoot offloading boot. Her x-ray of the foot was negative for any bone changes. She has not yet had vascular studies. ABI in this clinic was 0.88 06/19/16; formal arterial studies for early January for her daughter. In general the major wound over the plantar left first metatarsal head looks better. Area on her lateral fifth metatarsal head looks like it's proceeding towards closure. She is wearing the Darco forefoot offloading sandal was some difficulty 06/26/16; formal arterial studies towboat for early January. The area on the plantar left first metatarsal head apparently measures 0.2 x 0.2 mm smaller. She has the same surface slough and some nonviable surrounding tissue which is debrided with a curet. This cleans up quite nicely. The area on her lateral fifth [name]. ([id number]) metatarsal head is still fully epithelialized 07/03/16; formal arterial studies booked for early January. The areas on the plantar left first metatarsal head smaller by  about 0.1 mmo. His is not making quite is much progress as I was like. She is using a Darco forefoot offload her 07/10/16; she had her arterial studies today by Dr. [name]. I do not have the formal report however patient's daughter states she has "small vessel disease" presumably this means that there is not a revascularizable element. Electronic Signature(s) Signed: 07/10/2016 5:37:12 PM By: Linton Ham MD Entered By: Linton Ham on 07/10/2016 14:13:25 [place], Heath [name] ([id number]) -------------------------------------------------------------------------------- Physical Exam Details Patient Name: [name], [name] Date of Service: 07/10/2016 12:30 PM Medical Record Patient Account Number: [id number] Number: Treating RN: Montey Hora Aug 12, 1945 (70 y.o. Other Clinician: Date of Birth/Sex: Female) Treating ROBSON, MICHAEL Primary Care Physician/Extender: Estanislado Pandy, Cecille Rubin Physician: Referring Physician: Brendia Sacks in Treatment: 5 Constitutional Patient is hypertensive.. Pulse regular and within target range for patient.Marland Kitchen Respirations regular, non-labored and within target range.. Temperature is normal and within the target range for the patient.. Patient's appearance is neat and clean. Appears in no acute distress. Well nourished and well developed.. Cardiovascular Pedal pulses are palpable. Notes Wound exam; plantar aspect of the first metatarsal head using pickups and a scalpel I removed circumferential callus and nonviable skin. Then using a curet to remove necrotic material from the wound. There is still considerable depth here. No evidence of circumferential infection Electronic Signature(s) Signed: 07/10/2016 5:37:12 PM By: Linton Ham MD Entered By: Linton Ham on 07/10/2016 14:14:40 Platter, Jassmin L. ([id number]) -------------------------------------------------------------------------------- Physician Orders Details Patient Name: [name], [name]  L. Date of Service: 07/10/2016 12:30 PM Medical Record Patient Account Number: [id number] Number: Treating RN: Montey Hora 12-29-1945 (70 y.o. Other Clinician: Date of Birth/Sex: Female) Treating ROBSON, MICHAEL Primary Care Physician/Extender: Estanislado Pandy, Cecille Rubin Physician: Referring Physician: Brendia Sacks in Treatment: 5 Verbal / Phone Orders: Yes Clinician: Montey Hora Read Back and Verified: Yes Diagnosis Coding Wound Cleansing Wound #2 [place] wound with Normal Saline. Wound #3 Left,Lateral Foot   o Clean wound with Normal Saline. Anesthetic Wound #2 Left,Plantar Foot o Topical Lidocaine 4% cream applied to wound bed prior to debridement Wound #3 Left,Lateral Foot o Topical Lidocaine 4% cream applied to wound bed prior to debridement Primary Wound Dressing Wound #2 Left,Plantar Foot o Prisma Ag Wound #3 Left,Lateral Foot o Foam Secondary Dressing Wound #2 Left,Plantar Foot o Foam Wound #3 Left,Lateral Foot o Foam Dressing Change Frequency Wound #2 Left,Plantar Foot o Other: - twice this week then once weekly Wound #3 Left,Lateral Foot [place], Kaeley L. ([id number]) o Other: - twice this week then once weekly Follow-up Appointments Wound #2 [place] o Return Appointment in 1 week. o Other: - nurse visit on Friday 07/12/16 Wound #3 Left,Lateral Foot o Return Appointment in 1 week. o Other: - nurse visit on Friday 07/12/16 Off-Loading Wound #2 Left,Plantar Foot o Total Contact Cast to Left Lower Extremity Wound #3 Left,Lateral Foot o Total Contact Cast to Left Lower Extremity Electronic Signature(s) Signed: 07/10/2016 5:37:12 PM By: Linton Ham MD Signed: 07/10/2016 6:00:22 PM By: Montey Hora Entered By: Montey Hora on 07/10/2016 13:09:36 [place], Linneus ([id number]) -------------------------------------------------------------------------------- Problem List Details Patient Name:  [name], [name] Date of Service: 07/10/2016 12:30 PM Medical Record Patient Account Number: [id number] Number: Treating RN: Montey Hora 07/16/1945 (70 y.o. 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 Date of Service: 07/10/2016 12:30 PM Medical Record Patient Account Number: [id number] Number: Treating RN: Montey Hora Nov 05, 1945 (70 y.o. Other Clinician: Date of Birth/Sex: Female) Treating ROBSON, MICHAEL Primary Care Physician/Extender: Ashok Croon Physician: Referring Physician: Brendia Sacks in Treatment: 5 Subjective Chief Complaint Information obtained from Patient 06/05/16; patient is here for review of a wound on her right foot which is been present for at least a month History of Present Illness (HPI) The patient is here for followup. Denies fevers, has been compliant with wearing Darco shoes. She states that her blood sugars are still in the low 200s. Mikinzie is a 63F with a h/o DM who presents with an open ulcer on the plantar aspect of her L great toe. She has had it for close to 8 months. She has also had several debridements as well as courses of oral antibiotics. Currently, she denies fevers and has no pain. She has never had ulcers  like this in the past. Her blood sugars are normally in the 170s. Foot xray was negative for osteomyelitis. HgbA1c was 10.3. READMISSION; 06/05/16; Mrs. Newstrom is a 71 year old type II diabetic by description not well-controlled. She does not have known PAD or claudication. She has apparently some degree of neuropathy or she's been told that in the past. She has a history of a wound on the plantar left foot foot for which she was seen here in 2015 although I was not involved in her care nor were any other current physicians working in our clinics. She was managed with standard dressings and a Darco forefoot offloading boot and she seems to have healed quite quickly.  Apparently the patient noticed a callus/wound development a month ago although her daughter who is present states it was longer than that. Indeed the patient seems to been followed for at least 3 clinic visits with her podiatrist Dr. Gardiner Barefoot. On October 19 noted a necrotic ulcer on the plantar aspect of the left first metatarsal head. She was using Silvadene based dressings given Keflex on 10/9. I don't believe any cultures or x-rays were done. More importantly she doesn't seem to be adequately offloading this area in her footwear ABIs in this clinic were 0.88 bilaterally. 06/12/16; continued follow-up for a central wound over the left first metatarsal head. Once again today required an extensive debridement. She is using a Darco forefoot offloading boot. Her x-ray of the foot was negative for any bone changes. She has not yet had vascular studies. ABI in this clinic was 0.88 [place], [place]. ([id number]) 06/19/16; formal arterial studies for early January for her daughter. In general the major wound over the plantar left first metatarsal head looks better. Area on her lateral fifth metatarsal head looks like it's proceeding towards closure. She is wearing the Darco forefoot offloading sandal was some difficulty 06/26/16;  formal arterial studies towboat for early January. The area on the plantar left first metatarsal head apparently measures 0.2 x 0.2 mm smaller. She has the same surface slough and some nonviable surrounding tissue which is debrided with a curet. This cleans up quite nicely. The area on her lateral fifth metatarsal head is still fully epithelialized 07/03/16; formal arterial studies booked for early January. The areas on the plantar left first metatarsal head smaller by about 0.1 mm. His is not making quite is much progress as I was like. She is using a Darco forefoot offload her 07/10/16; she had her arterial studies today by Dr. [name]. I do not have the formal report however patient's daughter states she has "small vessel disease" presumably this means that there is not a revascularizable element. Objective Constitutional Patient is hypertensive.. Pulse regular and within target range for patient.Marland Kitchen Respirations regular, non-labored and within target range.. Temperature is normal and within the target range for the patient.. Patient's appearance is neat and clean. Appears in no acute distress. Well nourished and well developed.. Vitals Time Taken: 12:39 PM, Height: 62 in, Weight: 140 lbs, BMI: 25.6, Temperature: 98.3 F, Pulse: 77 bpm, Respiratory Rate: 18 breaths/min, Blood Pressure: 165/85 mmHg. Cardiovascular Pedal pulses are palpable. General Notes: Wound exam; plantar aspect of the first metatarsal head using pickups and a scalpel I removed circumferential callus and nonviable skin. Then using a curet to remove necrotic material from the wound. There is still considerable depth here. No evidence of circumferential infection Integumentary (Hair, Skin) Wound #2 status is Open. Original cause of wound was Gradually Appeared. The wound is located on the [place]. The wound measures 0.6cm length x 0.5cm width x 0.3cm depth; 0.236cm^2 area and 0.071cm^3 volume. There is fat  exposed. There is no tunneling or undermining noted. There is a large amount of serous drainage noted. The wound margin is epibole. There is large (67-100%) red granulation within the wound bed. There is a small (1-33%) amount of necrotic tissue within the wound bed including Eschar. The periwound skin appearance exhibited: Callus. The periwound skin appearance did not exhibit: Crepitus, Excoriation, Fluctuance, Friable, Induration, Localized Edema, Rash, Scarring, Dry/Scaly, Maceration, Moist, Atrophie Blanche, Cyanosis, Ecchymosis, Hemosiderin Staining, Mottled, Pallor, Rubor, Erythema. Periwound temperature was noted as No Abnormality. Wound #3 status is Open.  Original cause of wound was Blister. The wound is located on the [place] ([id number]) Foot. The wound measures 0.1cm length x 0.1cm width x 0.1cm depth; 0.008cm^2 area and 0.001cm^3 volume. The wound is limited to skin breakdown. There is no tunneling or undermining noted. There is a none present amount of drainage noted. The wound margin is flat and intact. There is no granulation within the wound bed. There is no necrotic tissue within the wound bed. The periwound skin appearance exhibited: Maceration. Assessment Active Problems ICD-10 E11.621 - Type 2 diabetes mellitus with foot ulcer L97.522 - Non-pressure chronic ulcer of other part of left foot with fat layer exposed Procedures Wound #2 Wound #2 is a Diabetic Wound/Ulcer of the Lower Extremity located on the Left,Plantar Foot . There was a Skin/Subcutaneous Tissue Debridement ([id number]) debridement with total area of 0.3 sq cm performed by Ricard Dillon, MD. with the following instrument(s): Blade, Curette, and Forceps to remove Viable and Non-Viable tissue/material including Fibrin/Slough, Callus, and Subcutaneous after achieving pain control using Lidocaine 4% Topical Solution. A time out was conducted at 13:05, prior to the start of the  procedure. A Minimum amount of bleeding was controlled with Pressure. The procedure was tolerated well with a pain level of 0 throughout and a pain level of 0 following the procedure. Post Debridement Measurements: 0.6cm length x 0.5cm width x 0.4cm depth; 0.094cm^3 volume. Character of Wound/Ulcer Post Debridement is improved. Severity of Tissue Post Debridement is: Fat layer exposed. Post procedure Diagnosis Wound #2: Same as Pre-Procedure Wound #2 is a Diabetic Wound/Ulcer of the Lower Extremity located on the [place] . There was a Total Contact Cast Procedure by Ricard Dillon, MD. Post procedure Diagnosis Wound #2: Same as Pre-Procedure Plan [place], [place] ([id number]) Wound Cleansing: Wound #2 Left,Plantar Foot: Clean wound with Normal Saline. Wound #3 Left,Lateral Foot: Clean wound with Normal Saline. Anesthetic: Wound #2 Left,Plantar Foot: Topical Lidocaine 4% cream applied to wound bed prior to debridement Wound #3 Left,Lateral Foot: Topical Lidocaine 4% cream applied to wound bed prior to debridement Primary Wound Dressing: Wound #2 Left,Plantar Foot: Prisma Ag Wound #3 Left,Lateral Foot: Foam Secondary Dressing: Wound #2 Left,Plantar Foot: Foam Wound #3 Left,Lateral Foot: Foam Dressing Change Frequency: Wound #2 Left,Plantar Foot: Other: - twice this week then once weekly Wound #3 Left,Lateral Foot: Other: - twice this week then once weekly Follow-up Appointments: Wound #2 Left,Plantar Foot: Return Appointment in 1 week. Other: - nurse visit on Friday 07/12/16 Wound #3 Left,Lateral Foot: Return Appointment in 1 week. Other: - nurse visit on Friday 07/12/16 Off-Loading: Wound #2 Left,Plantar Foot: Total Contact Cast to Left Lower Extremity Wound #3 Left,Lateral Foot: Total Contact Cast to Left Lower Extremity #1 we will have arterial studies for review next week. #2 x-ray of the foot was negative for osteomyelitis [place], [place]  ([id number]) #3 he continues to have copious amounts of circumferential callus thick skin around the wound to. All of this is indicative that she has inadequate pressure-relief even in a Darco forefoot offloading boot. After discussion we went ahead and put her in a total contact cast today with Prisma over the wound. Electronic Signature(s) Signed: 07/11/2016 4:25:28 PM By: Gretta Cool RN, BSN, Kim RN, BSN Signed: 07/16/2016 4:24:12 PM By: Linton Ham MD Previous Signature: 07/10/2016 5:37:12 PM Version By: Linton Ham MD Entered By: Gretta Cool RN, BSN, Kim on 07/11/2016 16:25:28 [place], Heath [name] ([id number]) -------------------------------------------------------------------------------- Total Contact Cast Details Patient Name: Yarbough, Kizzie L. Date of Service:  07/10/2016 12:30 PM Medical Record Patient Account Number: [id number] Number: Treating RN: Montey Hora 10-27-1945 (70 y.o. Other Clinician: Date of Birth/Sex: Female) Treating ROBSON, MICHAEL Primary Care Physician/Extender: Estanislado Pandy, Cecille Rubin Physician: Referring Physician: Brendia Sacks in Treatment: 5 Total Contact Cast Applied for Wound Wound #2 Left,Plantar Foot Assessment: Performed By: Physician Ricard Dillon, MD Post Procedure Diagnosis Same as Pre-procedure Electronic Signature(s) Signed: 07/10/2016 5:37:12 PM By: Linton Ham MD Entered By: Linton Ham on 07/10/2016 14:18:19 [place], [place] ([id number]) -------------------------------------------------------------------------------- SuperBill Details Patient Name: Carruthers, Janeann L. Date of Service: 07/10/2016 Medical Record Patient Account Number: [id number] Number: Treating RN: Montey Hora [birth year] (70 y.o. Other Clinician: Date of Birth/Sex: Female) Treating ROBSON, MICHAEL Primary Care Physician/Extender: Ashok Croon Physician: Weeks in Treatment: 5 Referring Physician: Lanier Prude Diagnosis Coding ICD-10 Codes Code  Description E11.621 Type 2 diabetes mellitus with foot ulcer L97.522 Non-pressure chronic ulcer of other part of left foot with fat layer exposed Facility Procedures CPT4 Code Description: [id number] 11042 - DEB SUBQ TISSUE 20 SQ CM/< ICD-10 Description Diagnosis E11.621 Type 2 diabetes mellitus with foot ulcer L97.522 Non-pressure chronic ulcer of other part of left foot Modifier: with fat lay Quantity: 1 er exposed CPT4 Code Description: [id number] 29445 - APPLY TOTAL CONTACT LEG CAST ICD-10 Description Diagnosis E11.621 Type 2 diabetes mellitus with foot ulcer Modifier: Quantity: 1 Physician Procedures CPT4 Code Description: [id number] 17001 - WC PHYS SUBQ TISS 20 SQ CM ICD-10 Description Diagnosis E11.621 Type 2 diabetes mellitus with foot ulcer L97.522 Non-pressure chronic ulcer of other part of left foot Modifier: with fat laye Quantity: 1 r exposed Electronic Signature(s) Signed: 07/10/2016 5:37:12 PM By: Linton Ham MD Entered By: Linton Ham on 07/10/2016 14:19:10

## 2016-07-12 ENCOUNTER — Encounter: Payer: Medicare Other | Admitting: Surgery

## 2016-07-12 ENCOUNTER — Ambulatory Visit: Payer: Medicare Other | Admitting: Physical Therapy

## 2016-07-12 DIAGNOSIS — E11621 Type 2 diabetes mellitus with foot ulcer: Secondary | ICD-10-CM | POA: Diagnosis not present

## 2016-07-13 NOTE — Progress Notes (Signed)
[name], [name] ([id number]) Visit Report for 07/12/2016 Arrival Information Details Patient Name: [name], [name] Date of Service: 07/12/2016 3:15 PM Medical Record Number: [id number] Patient Account Number: [id number] Date of Birth/Sex: 1945-10-18 (71 y.o. Female) Treating RN: Afful, RN, BSN, [name] Primary Care Physician: Lanier Prude Other Clinician: Referring Physician: Lanier Prude Treating Physician/Extender: Frann Rider in Treatment: 5 Visit Information History Since Last Visit All ordered tests and consults were No Patient Arrived: Ambulatory completed: Arrival Time: 15:48 Added or deleted any medications: No Accompanied By: grd dtr Any new allergies or adverse reactions: No Transfer Assistance: None Had a fall or experienced change in No Patient Identification Verified: Yes activities of daily living that may affect Secondary Verification Process Yes risk of falls: Completed: Signs or symptoms of abuse/neglect since No Patient Has Alerts: Yes last visito Patient Alerts: DMII Hospitalized since last visit: No Has Dressing in Place as Prescribed: Yes Has Footwear/Offloading in Place as Yes Prescribed: Left: Total Contact Cast Pain Present Now: No Electronic Signature(s) Signed: 07/12/2016 5:32:07 PM By: Regan Lemming BSN, RN Entered By: Regan Lemming on 07/12/2016 15:49:06 [place]ilda, [place]. ([id number]) -------------------------------------------------------------------------------- Encounter Discharge Information Details Patient Name: [name], [name] Date of Service: 07/12/2016 3:15 PM Medical Record Number: [id number] Patient Account Number: [id number] Date of Birth/Sex: 05-08-1946 (71 y.o. Female) Treating RN: Baruch Gouty, RN, BSN, [name] Primary Care Physician: Lanier Prude Other Clinician: Referring Physician: Lanier Prude Treating Physician/Extender: Frann Rider in Treatment: 5 Encounter Discharge Information Items Discharge Pain Level: 0 Discharge  Condition: Stable Ambulatory Status: Ambulatory Discharge Destination: Home Transportation: Private Auto Accompanied By: dtr Schedule Follow-up Appointment: No Medication Reconciliation completed and provided to Patient/Care No Aysia Lowder: Provided on Clinical Summary of Care: 07/12/2016 Form Type Recipient Paper Patient BR Electronic Signature(s) Signed: 07/12/2016 5:21:04 PM By: Regan Lemming BSN, RN Previous Signature: 07/12/2016 5:07:39 PM Version By: Ruthine Dose Entered By: Regan Lemming on 07/12/2016 17:21:04 [place], [place] ([id number]) -------------------------------------------------------------------------------- Lower Extremity Assessment Details Patient Name: [name], [name] Date of Service: 07/12/2016 3:15 PM Medical Record Number: [id number] Patient Account Number: [id number] Date of Birth/Sex: [birth year] (71 y.o. Female) Treating RN: Afful, RN, BSN, [name] Primary Care Physician: Lanier Prude Other Clinician: Referring Physician: Lanier Prude Treating Physician/Extender: Frann Rider in Treatment: 5 Vascular Assessment Pulses: Dorsalis Pedis Palpable: [Left:Yes] Posterior Tibial Extremity colors, hair growth, and conditions: Extremity Color: [Left:Normal] Hair Growth on Extremity: [Left:Yes] Temperature of Extremity: [Left:Warm] Capillary Refill: [Left:< 3 seconds] Toe Nail Assessment Left: Right: Thick: No Discolored: No Deformed: No Improper Length and Hygiene: No Electronic Signature(s) Signed: 07/12/2016 5:32:07 PM By: Regan Lemming BSN, RN Entered By: Regan Lemming on 07/12/2016 16:09:59 [place], Randall ([id number]) -------------------------------------------------------------------------------- Multi Wound Chart Details Patient Name: [name], [name] Date of Service: 07/12/2016 3:15 PM Medical Record Number: [id number] Patient Account Number: [id number] Date of Birth/Sex: [birth year] (71 y.o. Female) Treating RN: Baruch Gouty, RN, BSN, [name] Primary Care  Physician: Lanier Prude Other Clinician: Referring Physician: Lanier Prude Treating Physician/Extender: Frann Rider in Treatment: 5 Vital Signs Height(in): 62 Pulse(bpm): 78 Weight(lbs): 140 Blood Pressure 189/78 (mmHg): Body Mass Index(BMI): 26 Temperature(F): 98.3 Respiratory Rate 18 (breaths/min): Photos: [2:No Photos] [3:No Photos] [N/A:N/A] Wound Location: [2:Left, Plantar Foot] [3:Left, Lateral Foot] [N/A:N/A] Wounding Event: [2:Gradually Appeared] [3:Blister] [N/A:N/A] Primary Etiology: [2:Diabetic Wound/Ulcer of the Lower Extremity] [3:Pressure Ulcer] [N/A:N/A] Secondary Etiology: [2:Pressure Ulcer] [3:Diabetic Wound/Ulcer of the Lower Extremity] [N/A:N/A] Date Acquired: [2:03/25/2016] [3:06/03/2016] [N/A:N/A] Weeks of Treatment: [2:5] [3:5] [N/A:N/A] Wound Status: [2:Open] [3:Open] [N/A:N/A] Measurements L x W x  D 1x1x0.5 [3:0.1x0.1x0.1] [N/A:N/A] (cm) Area (cm) : [2:0.785] [3:0.008] [N/A:N/A] Volume (cm) : [2:0.393] [3:0.001] [N/A:N/A] % Reduction in Area: [2:-42.70%] [3:98.90%] [N/A:N/A] % Reduction in Volume: -42.90% [3:98.60%] [N/A:N/A] Classification: [2:Grade 2] [3:Category/Stage I] [N/A:N/A] Debridement: [2:Debridement ([id number])] [3:N/A] [N/A:N/A] Pre-procedure [2:16:20] [3:N/A] [N/A:N/A] Verification/Time Out Taken: Pain Control: [2:Lidocaine 4% Topical Solution] [3:N/A] [N/A:N/A] Tissue Debrided: [2:Fibrin/Slough, Callus, Subcutaneous] [3:N/A] [N/A:N/A] Level: [2:Skin/Subcutaneous Tissue] [3:N/A] [N/A:N/A] Debridement Area (sq [2:1] [3:N/A] [N/A:N/A] cm): [name], [name] ([id number]) Instrument: Curette N/A N/A Bleeding: Minimum N/A N/A Hemostasis Achieved: Pressure N/A N/A Procedural Pain: 0 N/A N/A Post Procedural Pain: 0 N/A N/A Debridement Treatment Procedure was tolerated N/A N/A Response: well Post Debridement 1x1x0.4 N/A N/A Measurements L x W x D (cm) Post Debridement 0.314 N/A N/A Volume: (cm) Periwound Skin Texture:  No Abnormalities Noted No Abnormalities Noted N/A Periwound Skin No Abnormalities Noted No Abnormalities Noted N/A Moisture: Periwound Skin Color: No Abnormalities Noted No Abnormalities Noted N/A Tenderness on No No N/A Palpation: Procedures Performed: Debridement N/A N/A Treatment Notes Electronic Signature(s) Signed: 07/12/2016 4:38:18 PM By: Christin Fudge MD, FACS Entered By: Christin Fudge on 07/12/2016 [place], [place]. ([id number]) -------------------------------------------------------------------------------- [place] Details Patient Name: [name], [name] Date of Service: 07/12/2016 3:15 PM Medical Record Number: [id number] Patient Account Number: [id number] Date of Birth/Sex: 1945/10/20 (71 y.o. Female) Treating RN: Afful, RN, BSN, [name] Primary Care Physician: Lanier Prude Other Clinician: Referring Physician: Lanier Prude Treating Physician/Extender: Frann Rider in Treatment: 5 Active Inactive Medication Nursing Diagnoses: Knowledge deficit related to medication safety: actual or potential Goals: Patient/caregiver will demonstrate understanding of new oral/IV medications prescribed at the [facility] (topical prescriptions are covered under the skin breakdown problem) Date Initiated: 06/05/2016 Goal Status: Active Interventions: Assess patient/caregiver ability to manage medication regimen upon admission and as needed Patient/Caregiver given reconciled medication list upon admission, changes in medications and discharge from the [place] Notes: Nutrition Nursing Diagnoses: Potential for alteratiion in Nutrition/Potential for imbalanced nutrition Goals: Patient/caregiver verbalizes understanding of need to maintain therapeutic glucose control per primary care physician Date Initiated: 06/05/2016 Goal Status: Active Interventions: Provide education on elevated blood sugars and impact on wound healing Notes: Orientation to the Wound  Care Program Nursing Diagnoses: Knowledge deficit related to the wound healing center program [place], [place]. ([id number]) Goals: Patient/caregiver will verbalize understanding of the [place] Program Date Initiated: 06/05/2016 Goal Status: Active Interventions: Provide education on orientation to the wound center Notes: Pressure Nursing Diagnoses: Knowledge deficit related to management of pressures ulcers Goals: Patient will remain free of pressure ulcers Date Initiated: 06/05/2016 Goal Status: Active Interventions: Assess: immobility, friction, shearing, incontinence upon admission and as needed Treatment Activities: Patient referred for seating evaluation to ensure proper offloading : 06/05/2016 Notes: Wound/Skin Impairment Nursing Diagnoses: Impaired tissue integrity Goals: Ulcer/skin breakdown will heal within 14 weeks Date Initiated: 06/05/2016 Goal Status: Active Interventions: Assess patient/caregiver ability to obtain necessary supplies Notes: Electronic Signature(s) Signed: 07/12/2016 5:32:07 PM By: Regan Lemming BSN, RN Entered By: Regan Lemming on 07/12/2016 [place], [place]. ([id number]) [place], [place] ([id number]) -------------------------------------------------------------------------------- Pain Assessment Details Patient Name: [name], [name] Date of Service: 07/12/2016 3:15 PM Medical Record Number: [id number] Patient Account Number: [id number] Date of Birth/Sex: 1945-09-10 (71 y.o. Female) Treating RN: Baruch Gouty, RN, BSN, [name] Primary Care Physician: Lanier Prude Other Clinician: Referring Physician: Lanier Prude Treating Physician/Extender: Frann Rider in Treatment: 5 Active Problems Location of Pain Severity and Description of Pain Patient Has Paino No Site Locations Pain Management  and Medication Current Pain Management: Electronic Signature(s) Signed: 07/12/2016 5:32:07 PM By: Regan Lemming BSN, RN Entered By: Regan Lemming on 07/12/2016 15:49:28 [place], [place] ([id number]) -------------------------------------------------------------------------------- Patient/Caregiver Education Details Patient Name: [name], [name] Date of Service: 07/12/2016 3:15 PM Medical Record Number: [id number] Patient Account Number: [id number] Date of Birth/Gender: 1945/11/10 (71 y.o. Female) Treating RN: Baruch Gouty, RN, BSN, [name] Primary Care Physician: Lanier Prude Other Clinician: Referring Physician: Lanier Prude Treating Physician/Extender: Frann Rider in Treatment: 5 Education Assessment Education Provided To: Patient Education Topics Provided Elevated Blood Sugar/ Impact on Healing: Methods: Explain/Verbal Responses: State content correctly Welcome To [facility] [place]: Methods: Explain/Verbal Responses: State content correctly Wound Debridement: Methods: Explain/Verbal Responses: State content correctly Electronic Signature(s) Signed: 07/12/2016 5:32:07 PM By: Regan Lemming BSN, RN Entered By: Regan Lemming on 07/12/2016 17:21:23 [place], Aspin L. ([id number]) -------------------------------------------------------------------------------- Wound Assessment Details Patient Name: [name], [name] Date of Service: 07/12/2016 3:15 PM Medical Record Number: [id number] Patient Account Number: [id number] Date of Birth/Sex: 10-20-1945 (71 y.o. Female) Treating RN: Afful, RN, BSN, [place] Primary Care Physician: Lanier Prude Other Clinician: Referring Physician: Lanier Prude Treating Physician/Extender: Frann Rider in Treatment: 5 Wound Status Wound Number: 2 Primary Etiology: Diabetic Wound/Ulcer of the Lower Extremity Wound Location: Left, Plantar Foot Secondary Pressure Ulcer Wounding Event: Gradually Appeared Etiology: Date Acquired: 03/25/2016 Wound Status: Open Weeks Of Treatment: 5 Clustered Wound: No Photos Photo Uploaded By: Regan Lemming on 07/12/2016 17:31:34 Wound Measurements Length:  (cm) 1 Width: (cm) 1 Depth: (cm) 0.5 Area: (cm) 0.785 Volume: (cm) 0.393 % Reduction in Area: -42.7% % Reduction in Volume: -42.9% Wound Description Classification: Grade 2 Periwound Skin Texture Texture Color No Abnormalities Noted: No No Abnormalities Noted: No Moisture No Abnormalities Noted: No Treatment Notes Wound #2 (Left, Plantar Foot) 1. Cleansed with: Kienitz, Junella L. ([id number]) Cleanse wound with antibacterial soap and water 4. Dressing Applied: Aquacel Ag 5. Secondary Dressing Applied Foam Kerlix/Conform 6. Footwear/Offloading device applied Other footwear/offloading device applied (specify in notes) Notes [facility] Electronic Signature(s) Signed: 07/12/2016 5:32:07 PM By: Regan Lemming BSN, RN Entered By: Regan Lemming on 07/12/2016 16:08:26 [place], [place] ([id number]) -------------------------------------------------------------------------------- Wound Assessment Details Patient Name: [name], [name] Date of Service: 07/12/2016 3:15 PM Medical Record Number: [id number] Patient Account Number: [id number] Date of Birth/Sex: [birth year] (71 y.o. Female) Treating RN: Afful, RN, BSN, [place] Primary Care Physician: Lanier Prude Other Clinician: Referring Physician: Lanier Prude Treating Physician/Extender: Frann Rider in Treatment: 5 Wound Status Wound Number: 3 Primary Etiology: Pressure Ulcer Wound Location: Left, Lateral Foot Secondary Diabetic Wound/Ulcer of the Lower Etiology: Extremity Wounding Event: Blister Wound Status: Open Date Acquired: 06/03/2016 Weeks Of Treatment: 5 Clustered Wound: No Photos Photo Uploaded By: Regan Lemming on 07/12/2016 17:31:55 Wound Measurements Length: (cm) 0.1 Width: (cm) 0.1 Depth: (cm) 0.1 Area: (cm) 0.008 Volume: (cm) 0.001 % Reduction in Area: 98.9% % Reduction in Volume: 98.6% Wound Description Classification: Category/Stage I Periwound Skin Texture Texture Color No Abnormalities Noted: No No  Abnormalities Noted: No [name], [name] ([id number]) Moisture No Abnormalities Noted: No Treatment Notes Wound #3 (Left, Lateral Foot) 1. Cleansed with: Cleanse wound with antibacterial soap and water 4. Dressing Applied: Aquacel Ag 5. Secondary Dressing Applied Foam Kerlix/Conform 6. Footwear/Offloading device applied Other footwear/offloading device applied (specify in notes) Notes [facility] Electronic Signature(s) Signed: 07/12/2016 5:32:07 PM By: Regan Lemming BSN, RN Entered By: Regan Lemming on 07/12/2016 16:08:26 [place], [place] ([id number]) -------------------------------------------------------------------------------- Vitals Details Patient Name: [name], [name] Date of Service: 07/12/2016 3:15  PM Medical Record Number: [id number] Patient Account Number: [id number] Date of Birth/Sex: 07-06-1946 (71 y.o. Female) Treating RN: Afful, RN, BSN, [place] Primary Care Physician: Lanier Prude Other Clinician: Referring Physician: Lanier Prude Treating Physician/Extender: Frann Rider in Treatment: 5 Vital Signs Time Taken: 15:49 Temperature (F): 98.3 Height (in): 62 Pulse (bpm): 78 Weight (lbs): 140 Respiratory Rate (breaths/min): 18 Body Mass Index (BMI): 25.6 Blood Pressure (mmHg): 189/78 Reference Range: 80 - 120 mg / dl Electronic Signature(s) Signed: 07/12/2016 5:32:07 PM By: Regan Lemming BSN, RN Entered By: Regan Lemming on 07/12/2016 15:56:58

## 2016-07-15 NOTE — Progress Notes (Addendum)
SELETA, HOV[place] ([id number]) Visit Report for 07/12/2016 Chief Complaint Document Details Patient Name: [name], [name] Date of Service: 07/12/2016 3:15 PM Medical Record Patient Account Number: [id number] Number: Afful, RN, BSN, Treating RN: 1946/04/09 [phone] y.o. [name] Date of Birth/Sex: Female) Other Clinician: Primary Care Physician: [name] Treating [name] Referring Physician: Lanier [name] Physician/Extender: [name] in Treatment: 5 Information Obtained from: Patient Chief Complaint 06/05/16; patient is here for review of a wound on her right foot which is been present for at least a month Electronic Signature(s) Signed: 07/12/2016 4:39:25 PM By: [name] Fudge MD, FACS Entered By: [name] on 07/12/2016 16:39:25 [place], [place] ([id number]) -------------------------------------------------------------------------------- Debridement Details Patient Name: [name], [name] Date of Service: 07/12/2016 3:15 PM Medical Record Patient Account Number: [id number] Number: Afful, RN, BSN, Treating RN: Jul 23, 1945 [phone] y.o. [name] Date of Birth/Sex: Female) Other Clinician: Primary Care Physician: [name] Treating Laurene Melendrez Referring Physician: Lanier [name] Physician/Extender: [name] in Treatment: 5 Debridement Performed for Wound #2 Left,Plantar Foot Assessment: Performed By: Physician [name] Fudge, MD Debridement: Debridement Pre-procedure Yes - 16:20 Verification/Time Out Taken: Start Time: 16:20 Pain Control: Lidocaine 4% Topical Solution Level: Skin/Subcutaneous Tissue Total Area Debrided (L x 1 (cm) x 1 (cm) = 1 (cm) W): Tissue and other Non-Viable, Callus, Fibrin/Slough, Subcutaneous material debrided: Instrument: Curette Bleeding: Minimum Hemostasis Achieved: Pressure End Time: 16:26 Procedural Pain: 0 Post Procedural Pain: 0 Response to Treatment: Procedure was tolerated well Post Debridement Measurements of Total  Wound Length: (cm) 1 Width: (cm) 1 Depth: (cm) 0.4 Volume: (cm) 0.314 Character of Wound/Ulcer Post Stable Debridement: Severity of Tissue Post Debridement: Fat layer exposed Post Procedure Diagnosis Same as Pre-procedure Notes large amount of callus removed surrounding the ulcerated wound and overhanging edges and subcutaneous debris sharply removed with a #3 curet. Bleeding controlled with pressure [name], [name] ([id number]) Electronic Signature(s) Signed: 07/12/2016 4:39:18 PM By: [name] Fudge MD, FACS Signed: 07/12/2016 5:32:07 PM By: [name] BSN, RN Entered By: [name] on 07/12/2016 16:39:18 [name], [name] ([id number]) -------------------------------------------------------------------------------- HPI Details Patient Name: [name], [name] Date of Service: 07/12/2016 3:15 PM Medical Record Patient Account Number: [id number] Number: Afful, RN, BSN, Treating RN: 30-Sep-1945 [phone] y.o. [name] Date of Birth/Sex: Female) Other Clinician: Primary Care Physician: [name] Treating [name] Referring Physician: Lanier [name] Physician/Extender: Weeks in Treatment: 5 History of Present Illness HPI Description: The patient is here for followup. Denies fevers, has been compliant with wearing Darco shoes. She states that her blood sugars are still in the low 200s. [name] is a 72F with a h/o DM who presents with an open ulcer on the plantar aspect of her L great toe. She has had it for close to 8 months. She has also had several debridements as well as courses of oral antibiotics. Currently, she denies fevers and has no pain. She has never had ulcers like this in the past. Her blood sugars are normally in the 170s. Foot xray was negative for osteomyelitis. HgbA1c was 10.3. READMISSION; 06/05/16; [name] is a 71 year old type II diabetic by description not well-controlled. She does not have known PAD or claudication. She has apparently some degree of  neuropathy or she's been told that in the past. She has a history of a wound on the plantar left foot foot for which she was seen here in 2015 although I was not involved in her care nor were any other current physicians working in our clinics. She was managed with standard dressings and  a Darco forefoot offloading boot and she seems to have healed quite quickly. Apparently the patient noticed a callus/wound development a month ago although her daughter who is present states it was longer than that. Indeed the patient seems to been followed for at least 3 clinic visits with her podiatrist Dr. Gardiner Barefoot. On October 19 noted a necrotic ulcer on the plantar aspect of the left first metatarsal head. She was using Silvadene based dressings given Keflex on 10/9. I don't believe any cultures or x-rays were done. More importantly she doesn't seem to be adequately offloading this area in her footwear ABIs in this clinic were 0.88 bilaterally. 06/12/16; continued follow-up for a central wound over the left first metatarsal head. Once again today required an extensive debridement. She is using a Darco forefoot offloading boot. Her x-ray of the foot was negative for any bone changes. She has not yet had vascular studies. ABI in this clinic was 0.88 06/19/16; formal arterial studies for early January for her daughter. In general the major wound over the plantar left first metatarsal head looks better. Area on her lateral fifth metatarsal head looks like it's proceeding towards closure. She is wearing the Darco forefoot offloading sandal was some difficulty 06/26/16; formal arterial studies towboat for early January. The area on the plantar left first metatarsal head apparently measures 0.2 x 0.2 mm smaller. She has the same surface slough and some nonviable surrounding tissue which is debrided with a curet. This cleans up quite nicely. The area on her lateral fifth metatarsal head is still fully  epithelialized [place], [place] ([id number]) 07/03/16; formal arterial studies booked for early January. The areas on the plantar left first metatarsal head smaller by about 0.1 mmo. His is not making quite is much progress as I was like. She is using a Darco forefoot offload her 07/10/16; she had her arterial studies today by Dr. [name]. I do not have the formal report however patient's daughter states she has "small vessel disease" presumably this means that there is not a revascularizable element. 07/12/2016 -- the arterial studies were done in the lab of Dr. Fletcher Anon, and he will be seeing the patient on 07/19/2016. the duplex examination showed diffuse plaque bilaterally with a 50-74% stenosis of the right SFA. Three- vessel runoff on the right two-vessel runoff on the left. The ABI on the right was 0.89 on the left was 0.95 and the TBI on the right was 0.52 and on the left was 0.36 Electronic Signature(s) Signed: 07/12/2016 4:50:35 PM By: [name] Fudge MD, FACS Previous Signature: 07/12/2016 4:41:35 PM Version By: [name] Fudge MD, FACS Entered By: [name] on 07/12/2016 16:50:35 [place], [place]. ([id number]) -------------------------------------------------------------------------------- Physical Exam Details Patient Name: [name], [name] Date of Service: 07/12/2016 3:15 PM Medical Record Patient Account Number: [id number] Number: Afful, RN, BSN, Treating RN: 16-Oct-1945 [phone] y.o. [name] Date of Birth/Sex: Female) Other Clinician: Primary Care Physician: [name] Treating [name] Referring Physician: Lanier [name] Physician/Extender: Weeks in Treatment: 5 Constitutional . Pulse regular. Respirations normal and unlabored. Afebrile. . Eyes Nonicteric. Reactive to light. Ears, Nose, Mouth, and Throat Lips, teeth, and gums WNL.Marland Kitchen Moist mucosa without lesions. Neck supple and nontender. No palpable supraclavicular or cervical adenopathy. Normal sized without  goiter. Respiratory WNL. No retractions.. Breath sounds WNL, No rubs, rales, rhonchi, or wheeze.. Cardiovascular Heart rhythm and rate regular, no murmur or gallop.. Pedal Pulses WNL. No clubbing, cyanosis or edema. Lymphatic No adneopathy. No adenopathy. No adenopathy. Musculoskeletal Adexa without tenderness  or enlargement.. Digits and nails w/o clubbing, cyanosis, infection, petechiae, ischemia, or inflammatory conditions.. Integumentary (Hair, Skin) No suspicious lesions. No crepitus or fluctuance. No peri-wound warmth or erythema. No masses.Marland Kitchen Psychiatric Judgement and insight Intact.. No evidence of depression, anxiety, or agitation.. Notes A large amount of callus was removed surrounding the ulcerated wound and overhanging edges and subcutaneous debris sharply removed with a #3 curet. Bleeding controlled with pressure Electronic Signature(s) Signed: 07/12/2016 4:51:01 PM By: [name] Fudge MD, FACS Entered By: [name] on 07/12/2016 16:51:01 [place], [place]. ([id number]) -------------------------------------------------------------------------------- Physician Orders Details Patient Name: [name], [name] Date of Service: 07/12/2016 3:15 PM Medical Record Patient Account Number: [id number] Number: Afful, RN, BSN, Treating RN: [birth year] [phone] y.o. [name] Date of Birth/Sex: Female) Other Clinician: Primary Care Physician: [name] Treating Apolonia Ellwood Referring Physician: Lanier [name] Physician/Extender: [name] in Treatment: 5 Verbal / Phone Orders: Yes Clinician: Afful, RN, BSN, Rita Read Back and Verified: Yes Diagnosis Coding Wound Cleansing Wound #2 Left,Plantar Foot o Cleanse wound with mild soap and water Wound #3 Left,Lateral Foot o Cleanse wound with mild soap and water Anesthetic Wound #2 Left,Plantar Foot o Topical Lidocaine 4% cream applied to wound bed prior to debridement Wound #3 Left,Lateral Foot o Topical Lidocaine 4% cream  applied to wound bed prior to debridement Primary Wound Dressing Wound #2 Left,Plantar Foot o Aquacel Ag Wound #3 Left,Lateral Foot o Foam Secondary Dressing Wound #2 Left,Plantar Foot o Foam Wound #3 Left,Lateral Foot o Foam Dressing Change Frequency Wound #2 Left,Plantar Foot o Other: - twice this week then once weekly Wound #3 Left,Lateral Foot o Other: - twice this week then once weekly [place], [place] ([id number]) Follow-up Appointments Wound #2 [place] o Return Appointment in 1 week. o Other: - nurse visit on Friday 07/12/16 Wound #3 Left,Lateral Foot o Return Appointment in 1 week. o Other: - nurse visit on Friday 07/12/16 Off-Loading Wound #2 Left,Plantar Foot o Total Contact Cast to Left Lower Extremity Wound #3 Left,Lateral Foot o Total Contact Cast to Left Lower Extremity Electronic Signature(s) Signed: 07/12/2016 4:52:44 PM By: [name] Fudge MD, FACS Signed: 07/12/2016 5:32:07 PM By: [name] BSN, RN Entered By: [name] on 07/12/2016 16:49:59 [place], [place] ([id number]) -------------------------------------------------------------------------------- Problem List Details Patient Name: [name], [name] Date of Service: 07/12/2016 3:15 PM Medical Record Patient Account Number: [id number] Number: Afful, RN, BSN, Treating RN: 05/14/1946 [phone] y.o. 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 Date of Service: 07/12/2016 3:15 PM Medical Record Patient Account Number: [id number] Number: Afful, RN, BSN, Treating RN: 1945/12/17 [phone] y.o. [name] Date of Birth/Sex: Female) Other Clinician: Primary Care Physician: [name] Treating [name] Referring Physician: Lanier [name] Physician/Extender: [name] in Treatment: 5 Subjective Chief Complaint Information obtained from Patient 06/05/16; patient is here for review of a wound on her right foot which is been present for at least a month History of Present Illness (HPI) The patient is here for followup. Denies fevers, has been compliant with  wearing Darco shoes. She states that her blood sugars are still in the low 200s. Donnalyn is a 48F with a h/o DM who presents with an open ulcer on the plantar aspect of her L great toe. She has had it for close to 8 months. She has also had several debridements as well as courses of oral antibiotics. Currently, she denies fevers and has no pain. She has never had ulcers like this in the past. Her blood sugars are normally in the 170s. Foot xray was negative for osteomyelitis. HgbA1c was 10.3. READMISSION; 06/05/16; Mrs. Porchia is a 71 year old type II diabetic by description not well-controlled. She does not have known PAD or claudication. She has apparently some degree of neuropathy or she's been told that in the past. She has a history of a wound on the plantar left foot foot for which she was seen here in 2015 although I was not involved in her care nor were any other current physicians working in our clinics. She was managed with standard dressings and a Darco forefoot offloading boot and she seems to have healed quite quickly. Apparently the patient noticed a callus/wound development a month ago  although her daughter who is present states it was longer than that. Indeed the patient seems to been followed for at least 3 clinic visits with her podiatrist Dr. Gardiner Barefoot. On October 19 noted a necrotic ulcer on the plantar aspect of the left first metatarsal head. She was using Silvadene based dressings given Keflex on 10/9. I don't believe any cultures or x-rays were done. More importantly she doesn't seem to be adequately offloading this area in her footwear ABIs in this clinic were 0.88 bilaterally. 06/12/16; continued follow-up for a central wound over the left first metatarsal head. Once again today required an extensive debridement. She is using a Darco forefoot offloading boot. Her x-ray of the foot was negative for any bone changes. She has not yet had vascular studies. ABI in this clinic was 0.88 06/19/16; formal arterial studies for early January for her daughter. In general the major wound over the [place], [place] ([id number]) plantar left first metatarsal head looks better. Area on her lateral fifth metatarsal head looks like it's proceeding towards closure. She is wearing the Darco forefoot offloading sandal was some difficulty 06/26/16; formal arterial studies towboat for early January. The area on the plantar left first metatarsal head apparently measures 0.2 x 0.2 mm smaller. She has the same surface slough and some nonviable surrounding tissue which is debrided with a curet. This cleans up quite nicely. The area on her lateral fifth metatarsal head is still fully epithelialized 07/03/16; formal arterial studies booked for early January. The areas on the plantar left first metatarsal head smaller by about 0.1 mm. His is not making quite is much progress as I was like. She is using a Darco forefoot offload her 07/10/16; she had her arterial studies today by Dr. [name]. I do not have the formal report however patient's daughter states she has "small vessel disease"  presumably this means that there is not a revascularizable element. 07/12/2016 -- the arterial studies were done in the lab of Dr. Fletcher Anon, and he will be seeing the patient on 07/19/2016. the duplex examination showed diffuse plaque bilaterally with a 50-74% stenosis of the right SFA. Three- vessel runoff on the right two-vessel runoff on the left. The ABI on the right was 0.89 on the left was 0.95 and the TBI on the right was 0.52 and  on the left was 0.36 Objective Constitutional Pulse regular. Respirations normal and unlabored. Afebrile. Vitals Time Taken: 3:49 PM, Height: 62 in, Weight: 140 lbs, BMI: 25.6, Temperature: 98.3 F, Pulse: 78 bpm, Respiratory Rate: 18 breaths/min, Blood Pressure: 189/78 mmHg. Eyes Nonicteric. Reactive to light. Ears, Nose, Mouth, and Throat Lips, teeth, and gums WNL.Marland Kitchen Moist mucosa without lesions. Neck supple and nontender. No palpable supraclavicular or cervical adenopathy. Normal sized without goiter. Respiratory WNL. No retractions.. Breath sounds WNL, No rubs, rales, rhonchi, or wheeze.. Cardiovascular Heart rhythm and rate regular, no murmur or gallop.. Pedal Pulses WNL. No clubbing, cyanosis or edema. Lymphatic No adneopathy. No adenopathy. No adenopathy. [name], [name] ([id number]) Musculoskeletal Adexa without tenderness or enlargement.. Digits and nails w/o clubbing, cyanosis, infection, petechiae, ischemia, or inflammatory conditions.Marland Kitchen Psychiatric Judgement and insight Intact.. No evidence of depression, anxiety, or agitation.. General Notes: A large amount of callus was removed surrounding the ulcerated wound and overhanging edges and subcutaneous debris sharply removed with a #3 curet. Bleeding controlled with pressure Integumentary (Hair, Skin) No suspicious lesions. No crepitus or fluctuance. No peri-wound warmth or erythema. No masses.. Wound #2 status is Open. Original cause of wound was Gradually Appeared. The wound is located on  the [place]. The wound measures 1cm length x 1cm width x 0.5cm depth; 0.785cm^2 area and 0.393cm^3 volume. Wound #3 status is Open. Original cause of wound was Blister. The wound is located on the Left,Lateral Foot. The wound measures 0.1cm length x 0.1cm width x 0.1cm depth; 0.008cm^2 area and 0.001cm^3 volume. Assessment Active Problems ICD-10 E11.621 - Type 2 diabetes mellitus with foot ulcer L97.522 - Non-pressure chronic ulcer of other part of left foot with fat layer exposed Having reviewed the arterial report I have recommended that she was to see Dr. Fletcher Anon for an opinion regarding options for intervention. After aggressive debridement of her callus and ulcer base I have reapplied the total contact cast and she will see Dr. Dellia Nims next week for further care. the base of the ulcer will be treated with silver alginate and appropriate offloading felt Procedures Wound #2 [name], [name] ([id number]) Wound #2 is a Diabetic Wound/Ulcer of the Lower Extremity located on the [place] . There was a Skin/Subcutaneous Tissue Debridement ([id number]) debridement with total area of 1 sq cm performed by [name] Fudge, MD. with the following instrument(s): Curette to remove Non-Viable tissue/material including Fibrin/Slough, Callus, and Subcutaneous after achieving pain control using Lidocaine 4% Topical Solution. A time out was conducted at 16:20, prior to the start of the procedure. A Minimum amount of bleeding was controlled with Pressure. The procedure was tolerated well with a pain level of 0 throughout and a pain level of 0 following the procedure. Post Debridement Measurements: 1cm length x 1cm width x 0.4cm depth; 0.314cm^3 volume. Character of Wound/Ulcer Post Debridement is stable. Severity of Tissue Post Debridement is: Fat layer exposed. Post procedure Diagnosis Wound #2: Same as Pre-Procedure General Notes: large amount of callus removed surrounding the  ulcerated wound and overhanging edges and subcutaneous debris sharply removed with a #3 curet. Bleeding controlled with pressure. Wound #2 is a Diabetic Wound/Ulcer of the Lower Extremity located on the [place] . There was a Total Contact Cast Procedure by [name] Fudge, MD. Post procedure Diagnosis Wound #2: Same as Pre-Procedure Wound #3 Wound #3 is a Pressure Ulcer located on the Left,Lateral Foot . There was a Total Contact Cast Procedure by [name] Fudge, MD. Post procedure Diagnosis Wound #3: Same as Pre-Procedure Plan  Wound Cleansing: Wound #2 Left,Plantar Foot: Cleanse wound with mild soap and water Wound #3 Left,Lateral Foot: Cleanse wound with mild soap and water Anesthetic: Wound #2 Left,Plantar Foot: Topical Lidocaine 4% cream applied to wound bed prior to debridement Wound #3 Left,Lateral Foot: Topical Lidocaine 4% cream applied to wound bed prior to debridement Primary Wound Dressing: Wound #2 Left,Plantar Foot: Aquacel Ag Wound #3 Left,Lateral Foot: Foam Secondary Dressing: Wound #2 Left,Plantar Foot: Foam Wound #3 Left,Lateral Foot: [name], [name] ([id number]) Foam Dressing Change Frequency: Wound #2 Left,Plantar Foot: Other: - twice this week then once weekly Wound #3 Left,Lateral Foot: Other: - twice this week then once weekly Follow-up Appointments: Wound #2 Left,Plantar Foot: Return Appointment in 1 week. Other: - nurse visit on Friday 07/12/16 Wound #3 Left,Lateral Foot: Return Appointment in 1 week. Other: - nurse visit on Friday 07/12/16 Off-Loading: Wound #2 Left,Plantar Foot: Total Contact Cast to Left Lower Extremity Wound #3 Left,Lateral Foot: Total Contact Cast to Left Lower Extremity Having reviewed the arterial report I have recommended that she was to see Dr. Fletcher Anon for an opinion regarding options for intervention. After aggressive debridement of her callus and ulcer base I have reapplied the total contact cast and she will see  Dr. Dellia Nims next week for further care. the base of the ulcer will be treated with silver alginate and appropriate offloading felt Electronic Signature(s) Signed: 07/15/2016 2:14:34 PM By: [name] Fudge MD, FACS Previous Signature: 07/12/2016 4:52:23 PM Version By: [name] Fudge MD, FACS Entered By: [name] on 07/15/2016 14:14:34 [place], [place] L. ([id number]) -------------------------------------------------------------------------------- Total Contact Cast Details Patient Name: Kiester, Janell L. Date of Service: 07/12/2016 3:15 PM Medical Record Patient Account Number: [id number] Number: Afful, RN, BSN, Treating RN: February 20, 1946 [phone] y.o. [name] Date of Birth/Sex: Female) Other Clinician: Primary Care Physician: [name] Treating Kaeden Mester Referring Physician: Lanier [name] Physician/Extender: [name] in Treatment: 5 Total Contact Cast Applied for Wound Wound #2 Left,Plantar Foot Assessment: Performed By: Physician [name] Fudge, MD Post Procedure Diagnosis Same as Pre-procedure Electronic Signature(s) Signed: 07/12/2016 5:18:42 PM By: [name] BSN, RN Signed: 07/15/2016 2:05:36 PM By: [name] Fudge MD, FACS Entered By: [name] on 07/12/2016 17:18:42 [place], [place] ([id number]) -------------------------------------------------------------------------------- Total Contact Cast Details Patient Name: Junker, Maudry L. Date of Service: 07/12/2016 3:15 PM Medical Record Patient Account Number: [id number] Number: Afful, RN, BSN, Treating RN: [birth year] [phone] y.o. [name] Date of Birth/Sex: Female) Other Clinician: Primary Care Physician: [name] Treating Lucill Mauck Referring Physician: Lanier [name] Physician/Extender: Weeks in Treatment: 5 Total Contact Cast Applied for Wound Wound #3 Left,Lateral Foot Assessment: Performed By: Physician [name] Fudge, MD Post Procedure Diagnosis Same as Pre-procedure Electronic Signature(s) Signed: 07/12/2016  5:18:42 PM By: [name] BSN, RN Signed: 07/15/2016 2:05:36 PM By: [name] Fudge MD, FACS Entered By: [name] on 07/12/2016 [place], [place] ([id number]) -------------------------------------------------------------------------------- SuperBill Details Patient Name: Cookson, Rhylie L. Date of Service: 07/12/2016 Medical Record Patient Account Number: [id number] Number: Afful, RN, BSN, Treating RN: Dec 07, 1945 [phone] y.o. 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 other part of left foot with fat layer exposed Facility Procedures CPT4 Code Description: 23536144 11042 - DEB SUBQ TISSUE 20 SQ CM/< ICD-10 Description Diagnosis E11.621 Type 2 diabetes mellitus with foot ulcer L97.522 Non-pressure chronic ulcer of other part  of left foot Modifier: with fat laye Quantity: 1 r exposed Physician Procedures CPT4 Code Description: 8403979 11042 - WC PHYS SUBQ TISS 20 SQ CM ICD-10 Description Diagnosis E11.621 Type 2 diabetes mellitus with foot ulcer L97.522 Non-pressure chronic ulcer of other part of left foot Modifier: with fat layer Quantity: 1 exposed Electronic Signature(s) Signed: 07/12/2016 4:52:35 PM By: [name] Fudge MD, FACS Entered By: [name] on 07/12/2016 16:52:35

## 2016-07-17 ENCOUNTER — Encounter: Payer: Medicare Other | Admitting: Internal Medicine

## 2016-07-17 DIAGNOSIS — E11621 Type 2 diabetes mellitus with foot ulcer: Secondary | ICD-10-CM | POA: Diagnosis not present

## 2016-07-18 NOTE — Progress Notes (Signed)
MARYANNE, HUNEYCUTT ([id number]) Visit Report for 07/17/2016 Arrival Information Details Patient Name: Commons, Genae L. Date of Service: 07/17/2016 2:45 PM Medical Record Patient Account Number: [id number] Number: Treating RN: Montey Hora September 02, 1945 (71 y.o. Other Clinician: Date of Birth/Sex: Female) Treating ROBSON, MICHAEL Primary Care Physician/Extender: Estanislado Pandy, Cecille Rubin Physician: Referring Physician: Brendia Sacks in Treatment: 6 Visit Information History Since Last Visit Added or deleted any medications: No Patient Arrived: Ambulatory Any new allergies or adverse reactions: No Arrival Time: 15:06 Had a fall or experienced change in No Accompanied By: dtr activities of daily living that may affect Transfer Assistance: None risk of falls: Patient Identification Verified: Yes Signs or symptoms of abuse/neglect since last No Secondary Verification Process Yes visito Completed: Hospitalized since last visit: No Patient Has Alerts: Yes Pain Present Now: No Patient Alerts: DMII Electronic Signature(s) Signed: 07/17/2016 5:40:28 PM By: Montey Hora Entered By: Montey Hora on 07/17/2016 15:06:48 Mehlhaff, Dawnmarie L. ([id number]) -------------------------------------------------------------------------------- Clinic Level of Care Assessment Details Patient Name: Dubiel, Dim L. Date of Service: 07/17/2016 2:45 PM Medical Record Patient Account Number: [id number] Number: Treating RN: Montey Hora 07/23/1945 (71 y.o. Other Clinician: Date of Birth/Sex: Female) Treating ROBSON, MICHAEL Primary Care Physician/Extender: Estanislado Pandy, Cecille Rubin Physician: Referring Physician: Brendia Sacks in Treatment: 6 Clinic Level of Care Assessment Items TOOL 4 Quantity Score []  - Use when only an EandM is performed on FOLLOW-UP visit 0 ASSESSMENTS - Nursing Assessment / Reassessment X - Reassessment of Co-morbidities (includes updates in patient status) 1 10 X  - Reassessment of Adherence to Treatment Plan 1 5 ASSESSMENTS - Wound and Skin Assessment / Reassessment X - Simple Wound Assessment / Reassessment - one wound 1 5 []  - Complex Wound Assessment / Reassessment - multiple wounds 0 []  - Dermatologic / Skin Assessment (not related to wound area) 0 ASSESSMENTS - Focused Assessment []  - Circumferential Edema Measurements - multi extremities 0 []  - Nutritional Assessment / Counseling / Intervention 0 X - Lower Extremity Assessment (monofilament, tuning fork, pulses) 1 5 []  - Peripheral Arterial Disease Assessment (using hand held doppler) 0 ASSESSMENTS - Ostomy and/or Continence Assessment and Care []  - Incontinence Assessment and Management 0 []  - Ostomy Care Assessment and Management (repouching, etc.) 0 PROCESS - Coordination of Care X - Simple Patient / Family Education for ongoing care 1 15 []  - Complex (extensive) Patient / Family Education for ongoing care 0 []  - Staff obtains Consents, Records, Test Results / Process Orders 0 Bratcher, Ramonda L. ([id number]) []  - Staff telephones HHA, Nursing Homes / Clarify orders / etc 0 []  - Routine Transfer to another Facility (non-emergent condition) 0 []  - Routine Hospital Admission (non-emergent condition) 0 []  - New Admissions / Biomedical engineer / Ordering NPWT, Apligraf, etc. 0 []  - Emergency Hospital Admission (emergent condition) 0 X - Simple Discharge Coordination 1 10 []  - Complex (extensive) Discharge Coordination 0 PROCESS - Special Needs []  - Pediatric / Minor Patient Management 0 []  - Isolation Patient Management 0 []  - Hearing / Language / Visual special needs 0 []  - Assessment of Community assistance (transportation, D/C planning, etc.) 0 []  - Additional assistance / Altered mentation 0 []  - Support Surface(s) Assessment (bed, cushion, seat, etc.) 0 INTERVENTIONS - Wound Cleansing / Measurement X - Simple Wound Cleansing - one wound 1 5 []  - Complex Wound Cleansing -  multiple wounds 0 X - Wound Imaging (photographs - any number of wounds) 1 5 []  - Wound Tracing (instead of photographs) 0 X -  Simple Wound Measurement - one wound 1 5 []  - Complex Wound Measurement - multiple wounds 0 INTERVENTIONS - Wound Dressings X - Small Wound Dressing one or multiple wounds 1 10 []  - Medium Wound Dressing one or multiple wounds 0 []  - Large Wound Dressing one or multiple wounds 0 []  - Application of Medications - topical 0 []  - Application of Medications - injection 0 Duhe, Shakeerah L. ([id number]) INTERVENTIONS - Miscellaneous []  - External ear exam 0 []  - Specimen Collection (cultures, biopsies, blood, body fluids, etc.) 0 []  - Specimen(s) / Culture(s) sent or taken to Lab for analysis 0 []  - Patient Transfer (multiple staff / Harrel Lemon Lift / Similar devices) 0 []  - Simple Staple / Suture removal (25 or less) 0 []  - Complex Staple / Suture removal (26 or more) 0 []  - Hypo / Hyperglycemic Management (close monitor of Blood Glucose) 0 []  - Ankle / Brachial Index (ABI) - do not check if billed separately 0 X - Vital Signs 1 5 Has the patient been seen at the hospital within the last three years: Yes Total Score: 80 Level Of Care: New/Established - Level 3 Electronic Signature(s) Signed: 07/17/2016 5:40:28 PM By: Montey Hora Entered By: Montey Hora on 07/17/2016 15:48:43 Sergent, Lanya L. ([id number]) -------------------------------------------------------------------------------- Encounter Discharge Information Details Patient Name: Halterman, Elverna L. Date of Service: 07/17/2016 2:45 PM Medical Record Patient Account Number: [id number] Number: Treating RN: Montey Hora March 08, 1946 (71 y.o. Other Clinician: Date of Birth/Sex: Female) Treating ROBSON, MICHAEL Primary Care Physician/Extender: Estanislado Pandy, Cecille Rubin Physician: Referring Physician: Brendia Sacks in Treatment: 6 Encounter Discharge Information Items Discharge Pain Level: 0 Discharge  Condition: Stable Ambulatory Status: Ambulatory Discharge Destination: Home Transportation: Other Accompanied By: dtr Schedule Follow-up Appointment: Yes Medication Reconciliation completed and provided to Patient/Care No Myer Bohlman: Provided on Clinical Summary of Care: 07/17/2016 Form Type Recipient Paper Patient BR Electronic Signature(s) Signed: 07/17/2016 5:10:22 PM By: Montey Hora Previous Signature: 07/17/2016 4:11:24 PM Version By: Ruthine Dose Entered By: Montey Hora on 07/17/2016 17:10:22 Balta, [place] ([id number]) -------------------------------------------------------------------------------- Lower Extremity Assessment Details Patient Name: Cirelli, Chihiro L. Date of Service: 07/17/2016 2:45 PM Medical Record Patient Account Number: [id number] Number: Treating RN: Montey Hora 12/22/1945 (70 y.o. Other Clinician: Date of Birth/Sex: Female) Treating ROBSON, MICHAEL Primary Care Physician/Extender: Estanislado Pandy, Cecille Rubin Physician: Referring Physician: Brendia Sacks in Treatment: 6 Vascular Assessment Pulses: Dorsalis Pedis Palpable: [Left:Yes] Posterior Tibial Extremity colors, hair growth, and conditions: Extremity Color: [Left:Normal] Hair Growth on Extremity: [Left:Yes] Temperature of Extremity: [Left:Warm] Capillary Refill: [Left:< 3 seconds] Electronic Signature(s) Signed: 07/17/2016 5:40:28 PM By: Montey Hora Entered By: Montey Hora on 07/17/2016 15:45:22 Coone, Ronne[place] ([id number]) -------------------------------------------------------------------------------- Multi Wound Chart Details Patient Name: Utley, Cheridan L. Date of Service: 07/17/2016 2:45 PM Medical Record Patient Account Number: [id number] Number: Treating RN: Montey Hora 05/17/1946 (70 y.o. Other Clinician: Date of Birth/Sex: Female) Treating ROBSON, MICHAEL Primary Care Physician/Extender: Estanislado Pandy, Cecille Rubin Physician: Referring Physician: Brendia Sacks in Treatment: 6 Vital Signs Height(in): 62 Pulse(bpm): 79 Weight(lbs): 140 Blood Pressure 191/71 (mmHg): Body Mass Index(BMI): 26 Temperature(F): 97.9 Respiratory Rate 18 (breaths/min): Photos: [N/A:N/A] Wound Location: Left Foot - Plantar Left Foot - Lateral N/A Wounding Event: Gradually Appeared Blister N/A Primary Etiology: Diabetic Wound/Ulcer of Pressure Ulcer N/A the Lower Extremity Secondary Etiology: Pressure Ulcer Diabetic Wound/Ulcer of N/A the Lower Extremity Comorbid History: Hypertension, Type II Hypertension, Type II N/A Diabetes Diabetes Date Acquired: 03/25/2016 06/03/2016 N/A Weeks of Treatment: 6 6 N/A Wound Status:  Open Open N/A Measurements L x W x D 0.7x0.2x0.2 0.1x0.1x0.1 N/A (cm) Area (cm) : 0.11 0.008 N/A Volume (cm) : 0.022 0.001 N/A % Reduction in Area: 80.00% 98.90% N/A % Reduction in Volume: 92.00% 98.60% N/A Classification: Grade 2 Category/Stage I N/A HBO Classification: N/A Grade 1 N/A Exudate Amount: Large None Present N/A Pesta, Makinze L. ([id number]) Exudate Type: Serous N/A N/A Exudate Color: amber N/A N/A Wound Margin: Flat and Intact Flat and Intact N/A Granulation Amount: Large (67-100%) None Present (0%) N/A Granulation Quality: Red N/A N/A Necrotic Amount: None Present (0%) None Present (0%) N/A Exposed Structures: Fascia: No Fascia: No N/A Fat: No Fat: No Tendon: No Tendon: No Muscle: No Muscle: No Joint: No Joint: No Bone: No Bone: No Limited to Skin Limited to Skin Breakdown Breakdown Epithelialization: None Large (67-100%) N/A Periwound Skin Texture: Callus: Yes Edema: No N/A Edema: No Excoriation: No Excoriation: No Induration: No Induration: No Callus: No Crepitus: No Crepitus: No Fluctuance: No Fluctuance: No Friable: No Friable: No Rash: No Rash: No Scarring: No Scarring: No Periwound Skin Maceration: No Maceration: No N/A Moisture: Moist: No Moist: No Dry/Scaly:  No Dry/Scaly: No Periwound Skin Color: Atrophie Blanche: No Atrophie Blanche: No N/A Cyanosis: No Cyanosis: No Ecchymosis: No Ecchymosis: No Erythema: No Erythema: No Hemosiderin Staining: No Hemosiderin Staining: No Mottled: No Mottled: No Pallor: No Pallor: No Rubor: No Rubor: No Temperature: N/A No Abnormality N/A Tenderness on No No N/A Palpation: Wound Preparation: Ulcer Cleansing: Ulcer Cleansing: N/A Rinsed/Irrigated with Rinsed/Irrigated with Saline Saline Topical Anesthetic Topical Anesthetic Applied: Other: lidocaine Applied: None 4% Treatment Notes Wound #2 (Left, Plantar Foot) 1. Cleansed with: Clean wound with Normal Saline Kiner, Etienne L. ([id number]) 2. Anesthetic Topical Lidocaine 4% cream to wound bed prior to debridement 4. Dressing Applied: Prisma Ag Other dressing (specify in notes) 5. Secondary Dressing Applied Dry Gauze Kerlix/Conform 6. Footwear/Offloading device applied Felt/Foam Wound #3 (Left, Lateral Foot) 1. Cleansed with: Clean wound with Normal Saline 4. Dressing Applied: Foam Electronic Signature(s) Signed: 07/17/2016 6:05:53 PM By: Linton Ham MD Entered By: Linton Ham on 07/17/2016 17:38:51 Temelec, [name] ([id number]) -------------------------------------------------------------------------------- [place] Details Patient Name: Knapke, Lula L. Date of Service: 07/17/2016 2:45 PM Medical Record Patient Account Number: [id number] Number: Treating RN: Montey Hora 03-22-1946 (70 y.o. Other Clinician: Date of Birth/Sex: Female) Treating ROBSON, MICHAEL Primary Care Physician/Extender: Estanislado Pandy, Cecille Rubin Physician: Referring Physician: Brendia Sacks in Treatment: 6 Active Inactive Medication Nursing Diagnoses: Knowledge deficit related to medication safety: actual or potential Goals: Patient/caregiver will demonstrate understanding of new oral/IV medications prescribed at the  [facility] (topical prescriptions are covered under the skin breakdown problem) Date Initiated: 06/05/2016 Goal Status: Active Interventions: Assess patient/caregiver ability to manage medication regimen upon admission and as needed Patient/Caregiver given reconciled medication list upon admission, changes in medications and discharge from the [place] Notes: Nutrition Nursing Diagnoses: Potential for alteratiion in Nutrition/Potential for imbalanced nutrition Goals: Patient/caregiver verbalizes understanding of need to maintain therapeutic glucose control per primary care physician Date Initiated: 06/05/2016 Goal Status: Active Interventions: Provide education on elevated blood sugars and impact on wound healing Notes: Orientation to the [place], [place] ([id number]) Nursing Diagnoses: Knowledge deficit related to the wound healing center program Goals: Patient/caregiver will verbalize understanding of the [place] Program Date Initiated: 06/05/2016 Goal Status: Active Interventions: Provide education on orientation to the wound center Notes: Pressure Nursing Diagnoses: Knowledge deficit related to management of pressures ulcers Goals: Patient will remain  free of pressure ulcers Date Initiated: 06/05/2016 Goal Status: Active Interventions: Assess: immobility, friction, shearing, incontinence upon admission and as needed Treatment Activities: Patient referred for seating evaluation to ensure proper offloading : 06/05/2016 Notes: Wound/Skin Impairment Nursing Diagnoses: Impaired tissue integrity Goals: Ulcer/skin breakdown will heal within 14 weeks Date Initiated: 06/05/2016 Goal Status: Active Interventions: Assess patient/caregiver ability to obtain necessary supplies Notes: ATOYA, ANDREW ([id number]) Electronic Signature(s) Signed: 07/17/2016 5:40:28 PM By: Montey Hora Entered By: Montey Hora on 07/17/2016 15:45:27 Demarcus,  Keighley L. ([id number]) -------------------------------------------------------------------------------- Pain Assessment Details Patient Name: Deblanc, Ember L. Date of Service: 07/17/2016 2:45 PM Medical Record Patient Account Number: [id number] Number: Treating RN: Montey Hora 10-15-1945 (70 y.o. Other Clinician: Date of Birth/Sex: Female) Treating ROBSON, MICHAEL Primary Care Physician/Extender: Estanislado Pandy, Cecille Rubin Physician: Referring Physician: Brendia Sacks in Treatment: 6 Active Problems Location of Pain Severity and Description of Pain Patient Has Paino No Site Locations Pain Management and Medication Current Pain Management: Notes Topical or injectable lidocaine is offered to patient for acute pain when surgical debridement is performed. If needed, Patient is instructed to use over the counter pain medication for the following 24-48 hours after debridement. Wound care MDs do not prescribed pain medications. Patient has chronic pain or uncontrolled pain. Patient has been instructed to make an appointment with their Primary Care Physician for pain management. Electronic Signature(s) Signed: 07/17/2016 5:40:28 PM By: Montey Hora Entered By: Montey Hora on 07/17/2016 15:07:30 Leland, [name] ([id number]) -------------------------------------------------------------------------------- Patient/Caregiver Education Details Patient Name: Boulter, Meleah L. Date of Service: 07/17/2016 2:45 PM Medical Record Patient Account Number: [id number] Number: Treating RN: Montey Hora [birth year] (70 y.o. Other Clinician: Date of Birth/Gender: Female) Treating ROBSON, MICHAEL Primary Care Physician/Extender: Ashok Croon Physician: Weeks in Treatment: 6 Referring Physician: Lanier Prude Education Assessment Education Provided To: Patient and Caregiver Education Topics Provided Wound/Skin Impairment: Handouts: Other: wound care as ordered Methods:  Demonstration, Explain/Verbal Responses: State content correctly Electronic Signature(s) Signed: 07/17/2016 5:40:28 PM By: Montey Hora Entered By: Montey Hora on 07/17/2016 17:10:40 Summer, Eliza L. ([id number]) -------------------------------------------------------------------------------- Wound Assessment Details Patient Name: Laverdiere, Eldine L. Date of Service: 07/17/2016 2:45 PM Medical Record Patient Account Number: [id number] Number: Treating RN: Montey Hora 08/14/1945 (70 y.o. Other Clinician: Date of Birth/Sex: Female) Treating ROBSON, MICHAEL Primary Care Physician/Extender: Estanislado Pandy, Cecille Rubin Physician: Referring Physician: Brendia Sacks in Treatment: 6 Wound Status Wound Number: 2 Primary Etiology: Diabetic Wound/Ulcer of the Lower Extremity Wound Location: Left Foot - Plantar Secondary Pressure Ulcer Wounding Event: Gradually Appeared Etiology: Date Acquired: 03/25/2016 Wound Status: Open Weeks Of Treatment: 6 Comorbid Hypertension, Type II Diabetes Clustered Wound: No History: Photos Wound Measurements Length: (cm) 0.7 % Reduction in Area Width: (cm) 0.2 % Reduction in Volu Depth: (cm) 0.2 Epithelialization: Area: (cm) 0.11 Tunneling: Volume: (cm) 0.022 Undermining: : 80% me: 92% None No No Wound Description Classification: Grade 2 Wound Margin: Flat and Intact Exudate Amount: Large Exudate Type: Serous Exudate Color: amber Wound Bed Granulation Amount: Large (67-100%) Exposed Structure Granulation Quality: Red Fascia Exposed: No Signore, Titilayo L. ([id number]) Necrotic Amount: None Present (0%) Fat Layer Exposed: No Tendon Exposed: No Muscle Exposed: No Joint Exposed: No Bone Exposed: No Limited to Skin Breakdown Periwound Skin Texture Texture Color No Abnormalities Noted: No No Abnormalities Noted: No Callus: Yes Atrophie Blanche: No Crepitus: No Cyanosis: No Excoriation: No Ecchymosis: No Fluctuance:  No Erythema: No Friable: No Hemosiderin Staining: No Induration: No Mottled: No Localized Edema: No Pallor: No Rash: No  Rubor: No Scarring: No Moisture No Abnormalities Noted: No Dry / Scaly: No Maceration: No Moist: No Wound Preparation Ulcer Cleansing: Rinsed/Irrigated with Saline Topical Anesthetic Applied: Other: lidocaine 4%, Treatment Notes Wound #2 (Left, Plantar Foot) 1. Cleansed with: Clean wound with Normal Saline 2. Anesthetic Topical Lidocaine 4% cream to wound bed prior to debridement 4. Dressing Applied: Prisma Ag Other dressing (specify in notes) 5. Secondary Dressing Applied Dry Gauze Kerlix/Conform 6. Footwear/Offloading device applied Felt/Foam Electronic Signature(s) Signed: 07/17/2016 5:40:28 PM By: Elvina Mattes, [place] ([id number]) Entered By: Montey Hora on 07/17/2016 15:44:14 Loyd, [place] ([id number]) -------------------------------------------------------------------------------- Wound Assessment Details Patient Name: Dennington, Kaysa L. Date of Service: 07/17/2016 2:45 PM Medical Record Patient Account Number: [id number] Number: Treating RN: Montey Hora 1945/10/03 (70 y.o. Other Clinician: Date of Birth/Sex: Female) Treating ROBSON, MICHAEL Primary Care Physician/Extender: Estanislado Pandy, Cecille Rubin Physician: Referring Physician: Brendia Sacks in Treatment: 6 Wound Status Wound Number: 3 Primary Etiology: Pressure Ulcer Wound Location: Left Foot - Lateral Secondary Diabetic Wound/Ulcer of the Lower Etiology: Extremity Wounding Event: Blister Wound Status: Open Date Acquired: 06/03/2016 Comorbid Hypertension, Type II Diabetes Weeks Of Treatment: 6 History: Clustered Wound: No Photos Wound Measurements Length: (cm) 0.1 Width: (cm) 0.1 Depth: (cm) 0.1 Area: (cm) 0.008 Volume: (cm) 0.001 % Reduction in Area: 98.9% % Reduction in Volume: 98.6% Epithelialization: Large (67-100%) Tunneling:  No Undermining: No Wound Description Classification: Category/Stage I Foul Odor A Diabetic Severity (Wagner): Grade 1 Wound Margin: Flat and Intact Exudate Amount: None Present fter Cleansing: No Wound Bed Granulation Amount: None Present (0%) Exposed Structure Necrotic Amount: None Present (0%) Fascia Exposed: No Fat Layer Exposed: No Tendon Exposed: No Gallicchio, Lakshmi L. ([id number]) Muscle Exposed: No Joint Exposed: No Bone Exposed: No Limited to Skin Breakdown Periwound Skin Texture Texture Color No Abnormalities Noted: No No Abnormalities Noted: No Callus: No Atrophie Blanche: No Crepitus: No Cyanosis: No Excoriation: No Ecchymosis: No Fluctuance: No Erythema: No Friable: No Hemosiderin Staining: No Induration: No Mottled: No Localized Edema: No Pallor: No Rash: No Rubor: No Scarring: No Temperature / Pain Moisture Temperature: No Abnormality No Abnormalities Noted: No Dry / Scaly: No Maceration: No Moist: No Wound Preparation Ulcer Cleansing: Rinsed/Irrigated with Saline Topical Anesthetic Applied: None Treatment Notes Wound #3 (Left, Lateral Foot) 1. Cleansed with: Clean wound with Normal Saline 4. Dressing Applied: Foam Electronic Signature(s) Signed: 07/17/2016 5:40:28 PM By: Montey Hora Entered By: Montey Hora on 07/17/2016 15:44:58 Gellis, Priyal L. ([id number]) -------------------------------------------------------------------------------- Vitals Details Patient Name: Mirando, Micheala L. Date of Service: 07/17/2016 2:45 PM Medical Record Patient Account Number: [id number] Number: Treating RN: Montey Hora [birth year] (70 y.o. Other Clinician: Date of Birth/Sex: Female) Treating ROBSON, MICHAEL Primary Care Physician/Extender: Estanislado Pandy, Cecille Rubin Physician: Referring Physician: Brendia Sacks in Treatment: 6 Vital Signs Time Taken: 15:07 Temperature (F): 97.9 Height (in): 62 Pulse (bpm): 79 Weight (lbs):  140 Respiratory Rate (breaths/min): 18 Body Mass Index (BMI): 25.6 Blood Pressure (mmHg): 191/71 Reference Range: 80 - 120 mg / dl Electronic Signature(s) Signed: 07/17/2016 5:40:28 PM By: Montey Hora Entered By: Montey Hora on 07/17/2016 15:08:38

## 2016-07-18 NOTE — Progress Notes (Signed)
JAQLYN, GRUENHAGEN ([id number]) Visit Report for 07/17/2016 Chief Complaint Document Details Patient Name: [name], [name] Date of Service: 07/17/2016 2:45 PM Medical Record Patient Account Number: [id number] Number: Treating RN: Montey Hora 06-25-1946 (70 y.o. Other Clinician: Date of Birth/Sex: Female) Treating Tinya Cadogan Primary Care Physician/Extender: Ashok Croon Physician: Referring Physician: Brendia Sacks in Treatment: 6 Information Obtained from: Patient Chief Complaint 06/05/16; patient is here for review of a wound on her right foot which is been present for at least a month Electronic Signature(s) Signed: 07/17/2016 6:05:53 PM By: Linton Ham MD Entered By: Linton Ham on 07/17/2016 17:39:04 [place], [place] ([id number]) -------------------------------------------------------------------------------- HPI Details Patient Name: [name], [name] Date of Service: 07/17/2016 2:45 PM Medical Record Patient Account Number: [id number] Number: Treating RN: Montey Hora 01-20-1946 (70 y.o. Other Clinician: Date of Birth/Sex: Female) Treating Nataya Bastedo Primary Care Physician/Extender: Estanislado Pandy, Cecille Rubin Physician: Referring Physician: Brendia Sacks in Treatment: 6 History of Present Illness HPI Description: The patient is here for followup. Denies fevers, has been compliant with wearing Darco shoes. She states that her blood sugars are still in the low 200s. Kehinde is a 71F with a h/o DM who presents with an open ulcer on the plantar aspect of her L great toe. She has had it for close to 8 months. She has also had several debridements as well as courses of oral antibiotics. Currently, she denies fevers and has no pain. She has never had ulcers like this in the past. Her blood sugars are normally in the 170s. Foot xray was negative for osteomyelitis. HgbA1c was 10.3. READMISSION; 06/05/16; Mrs. Friesz is a 71 year old type  II diabetic by description not well-controlled. She does not have known PAD or claudication. She has apparently some degree of neuropathy or she's been told that in the past. She has a history of a wound on the plantar left foot foot for which she was seen here in 2015 although I was not involved in her care nor were any other current physicians working in our clinics. She was managed with standard dressings and a Darco forefoot offloading boot and she seems to have healed quite quickly. Apparently the patient noticed a callus/wound development a month ago although her daughter who is present states it was longer than that. Indeed the patient seems to been followed for at least 3 clinic visits with her podiatrist Dr. Gardiner Barefoot. On October 19 noted a necrotic ulcer on the plantar aspect of the left first metatarsal head. She was using Silvadene based dressings given Keflex on 10/9. I don't believe any cultures or x-rays were done. More importantly she doesn't seem to be adequately offloading this area in her footwear ABIs in this clinic were 0.88 bilaterally. 06/12/16; continued follow-up for a central wound over the left first metatarsal head. Once again today required an extensive debridement. She is using a Darco forefoot offloading boot. Her x-ray of the foot was negative for any bone changes. She has not yet had vascular studies. ABI in this clinic was 0.88 06/19/16; formal arterial studies for early January for her daughter. In general the major wound over the plantar left first metatarsal head looks better. Area on her lateral fifth metatarsal head looks like it's proceeding towards closure. She is wearing the Darco forefoot offloading sandal was some difficulty 06/26/16; formal arterial studies towboat for early January. The area on the plantar left first metatarsal head apparently measures 0.2 x 0.2 mm smaller. She has the same  surface slough and some nonviable surrounding tissue  which is debrided with a curet. This cleans up quite nicely. The area on her lateral fifth [place], [place]. ([id number]) metatarsal head is still fully epithelialized 07/03/16; formal arterial studies booked for early January. The areas on the plantar left first metatarsal head smaller by about 0.1 mmo. His is not making quite is much progress as I was like. She is using a Darco forefoot offload her 07/10/16; she had her arterial studies today by Dr. [name]. I do not have the formal report however patient's daughter states she has "small vessel disease" presumably this means that there is not a revascularizable element. 07/12/2016 -- the arterial studies were done in the lab of Dr. Fletcher Anon, and he will be seeing the patient on 07/19/2016. the duplex examination showed diffuse plaque bilaterally with a 50-74% stenosis of the right SFA. Three- vessel runoff on the right two-vessel runoff on the left. The ABI on the right was 0.89 on the left was 0.95 and the TBI on the right was 0.52 and on the left was 0.36 07/17/16; her arterial studies are noted. She had a 50-74% stenosis of the right SFA three-vessel runoff on the right two-vessel runoff on the left. Her wound is on the left first metatarsal head. She had an appointment with Dr. Fletcher Anon however that had to reschedule list. The patient is traveling over the weekend and therefore she requested not to have the total contact cast replaced Electronic Signature(s) Signed: 07/17/2016 6:05:53 PM By: Linton Ham MD Entered By: Linton Ham on 07/17/2016 17:40:49 [place], [place] ([id number]) -------------------------------------------------------------------------------- Physical Exam Details Patient Name: [name], [name] Date of Service: 07/17/2016 2:45 PM Medical Record Patient Account Number: [id number] Number: Treating RN: Montey Hora [birth year] (70 y.o. Other Clinician: Date of Birth/Sex: Female) Treating Daivd Fredericksen,  Kenlie Seki Primary Care Physician/Extender: Estanislado Pandy, Cecille Rubin Physician: Referring Physician: Brendia Sacks in Treatment: 6 Constitutional Patient is hypertensive.. Pulse regular and within target range for patient.Marland Kitchen Respirations regular, non-labored and within target range.. Temperature is normal and within the target range for the patient.. Patient's appearance is neat and clean. Appears in no acute distress. Well nourished and well developed.. Notes Wound exam; the patient has a really healthy-looking wound no debridement was required. Certainly less circumferential callus than before the total contact cast was put on. Electronic Signature(s) Signed: 07/17/2016 6:05:53 PM By: Linton Ham MD Entered By: Linton Ham on 07/17/2016 17:41:33 [place], [name] ([id number]) -------------------------------------------------------------------------------- Physician Orders Details Patient Name: [name], [name] Date of Service: 07/17/2016 2:45 PM Medical Record Patient Account Number: [id number] Number: Treating RN: Montey Hora Nov 18, 1945 (70 y.o. Other Clinician: Date of Birth/Sex: Female) Treating Jakiya Bookbinder Primary Care Physician/Extender: Estanislado Pandy, Cecille Rubin Physician: Referring Physician: Brendia Sacks in Treatment: 6 Verbal / Phone Orders: Yes Clinician: Montey Hora Read Back and Verified: Yes Diagnosis Coding Wound Cleansing Wound #2 [place] wound with Normal Saline. Wound #3 Left,Lateral Foot o Clean wound with Normal Saline. Anesthetic Wound #2 Left,Plantar Foot o Topical Lidocaine 4% cream applied to wound bed prior to debridement Wound #3 Left,Lateral Foot o Topical Lidocaine 4% cream applied to wound bed prior to debridement Primary Wound Dressing Wound #2 Left,Plantar Foot o Prisma Ag Wound #3 Left,Lateral Foot o Foam Secondary Dressing Wound #2 Left,Plantar Foot o ABD and Kerlix/Conform - secure lightly  with coban Wound #3 Left,Lateral Foot o ABD and Kerlix/Conform - secure lightly with coban Dressing Change Frequency Wound #2 Left,Plantar Foot o  Change dressing every other day. Wound #3 Left,Lateral Foot [name], [name] ([id number]) o Change dressing every other day. Follow-up Appointments Wound #2 [place] o Return Appointment in 1 week. Wound #3 Left,Lateral Foot o Return Appointment in 1 week. Off-Loading Wound #2 Left,Plantar Foot o Other: - front-offloader Wound #3 Left,Lateral Foot o Other: - front-offloader Additional Orders / Instructions Wound #2 Left,Plantar Foot o Increase protein intake. Wound #3 Left,Lateral Foot o Increase protein intake. Electronic Signature(s) Signed: 07/17/2016 5:40:28 PM By: Montey Hora Signed: 07/17/2016 6:05:53 PM By: Linton Ham MD Entered By: Montey Hora on 07/17/2016 15:47:54 [place], [place] ([id number]) -------------------------------------------------------------------------------- Problem List Details Patient Name: [name], [name] Date of Service: 07/17/2016 2:45 PM Medical Record Patient Account Number: [id number] Number: Treating RN: Montey Hora 12/03/1945 (70 y.o. 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 Date of Service: 07/17/2016 2:45 PM Medical Record Patient Account Number: [id number] Number: Treating RN: Montey Hora 1946/04/28 (70 y.o. Other Clinician: Date of Birth/Sex: Female) Treating Daimen Shovlin Primary Care Physician/Extender: Ashok Croon Physician: Referring Physician: Brendia Sacks in Treatment: 6 Subjective Chief Complaint Information obtained from Patient 06/05/16; patient is here for review of a wound on her right foot which is been present for at least a month History of Present Illness (HPI) The patient is here for followup. Denies fevers, has been compliant with wearing Darco shoes. She states that her blood sugars are still in the low 200s. Quinnie is a 68F with a h/o DM who presents with an open ulcer on the plantar aspect of her L great toe. She has had it for close to 8 months. She has also had several debridements as well as courses of oral antibiotics. Currently, she denies fevers and has no pain. She has never had ulcers like this in the past. Her blood sugars are normally in the 170s. Foot xray was negative for osteomyelitis. HgbA1c was 10.3. READMISSION; 06/05/16; Mrs. Magouirk is a 71 year old type II diabetic by description not well-controlled. She does not have known PAD or claudication. She has apparently some degree of neuropathy or she's been told that in the past. She has a history of a wound on the plantar left foot foot for which she was seen here in 2015 although I was not involved in her care nor were any other current physicians working in our clinics. She was managed with standard dressings and a Darco forefoot offloading boot and she seems to have healed quite quickly. Apparently the patient noticed a callus/wound development a month ago although her daughter who is present states it was longer than that. Indeed the patient seems to been followed for at least 3 clinic visits with her  podiatrist Dr. Gardiner Barefoot. On October 19 noted a necrotic ulcer on the plantar aspect of the left first metatarsal head. She was using Silvadene based dressings given Keflex on 10/9. I don't believe any cultures or x-rays were done. More importantly she doesn't seem to be adequately offloading this  area in her footwear ABIs in this clinic were 0.88 bilaterally. 06/12/16; continued follow-up for a central wound over the left first metatarsal head. Once again today required an extensive debridement. She is using a Darco forefoot offloading boot. Her x-ray of the foot was negative for any bone changes. She has not yet had vascular studies. ABI in this clinic was 0.88 [place], [place]. ([id number]) 06/19/16; formal arterial studies for early January for her daughter. In general the major wound over the plantar left first metatarsal head looks better. Area on her lateral fifth metatarsal head looks like it's proceeding towards closure. She is wearing the Darco forefoot offloading sandal was some difficulty 06/26/16; formal arterial studies towboat for early January. The area on the plantar left first metatarsal head apparently measures 0.2 x 0.2 mm smaller. She has the same surface slough and some nonviable surrounding tissue which is debrided with a curet. This cleans up quite nicely. The area on her lateral fifth metatarsal head is still fully epithelialized 07/03/16; formal arterial studies booked for early January. The areas on the plantar left first metatarsal head smaller by about 0.1 mm. His is not making quite is much progress as I was like. She is using a Darco forefoot offload her 07/10/16; she had her arterial studies today by Dr. [name]. I do not have the formal report however patient's daughter states she has "small vessel disease" presumably this means that there is not a revascularizable element. 07/12/2016 -- the arterial studies were done in the lab of Dr. Fletcher Anon, and he will be  seeing the patient on 07/19/2016. the duplex examination showed diffuse plaque bilaterally with a 50-74% stenosis of the right SFA. Three- vessel runoff on the right two-vessel runoff on the left. The ABI on the right was 0.89 on the left was 0.95 and the TBI on the right was 0.52 and on the left was 0.36 07/17/16; her arterial studies are noted. She had a 50-74% stenosis of the right SFA three-vessel runoff on the right two-vessel runoff on the left. Her wound is on the left first metatarsal head. She had an appointment with Dr. Fletcher Anon however that had to reschedule list. The patient is traveling over the weekend and therefore she requested not to have the total contact cast replaced Objective Constitutional Patient is hypertensive.. Pulse regular and within target range for patient.Marland Kitchen Respirations regular, non-labored and within target range.. Temperature is normal and within the target range for the patient.. Patient's appearance is neat and clean. Appears in no acute distress. Well nourished and well developed.. Vitals Time Taken: 3:07 PM, Height: 62 in, Weight: 140 lbs, BMI: 25.6, Temperature: 97.9 F, Pulse: 79 bpm, Respiratory Rate: 18 breaths/min, Blood Pressure: 191/71 mmHg. General Notes: Wound exam; the patient has a really healthy-looking wound no debridement was required. Certainly less circumferential callus than before the total contact cast was put on. Integumentary (Hair, Skin) Wound #2 status is Open. Original cause of wound was Gradually Appeared. The wound is located on the [place]. The wound measures 0.7cm length x 0.2cm width x 0.2cm depth; 0.11cm^2 area and 0.022cm^3 volume. The wound is limited to skin breakdown. There is no tunneling or undermining noted. There is a large amount of serous drainage noted. The wound margin is flat and intact. There is large (67- 100%) red granulation within the wound bed. There is no necrotic tissue within the wound bed.  The [name], [name] ([id number]) periwound skin appearance exhibited: Callus. The periwound skin appearance did not  exhibit: Crepitus, Excoriation, Fluctuance, Friable, Induration, Localized Edema, Rash, Scarring, Dry/Scaly, Maceration, Moist, Atrophie Blanche, Cyanosis, Ecchymosis, Hemosiderin Staining, Mottled, Pallor, Rubor, Erythema. Wound #3 status is Open. Original cause of wound was Blister. The wound is located on the Left,Lateral Foot. The wound measures 0.1cm length x 0.1cm width x 0.1cm depth; 0.008cm^2 area and 0.001cm^3 volume. The wound is limited to skin breakdown. There is no tunneling or undermining noted. There is a none present amount of drainage noted. The wound margin is flat and intact. There is no granulation within the wound bed. There is no necrotic tissue within the wound bed. The periwound skin appearance did not exhibit: Callus, Crepitus, Excoriation, Fluctuance, Friable, Induration, Localized Edema, Rash, Scarring, Dry/Scaly, Maceration, Moist, Atrophie Blanche, Cyanosis, Ecchymosis, Hemosiderin Staining, Mottled, Pallor, Rubor, Erythema. Periwound temperature was noted as No Abnormality. Assessment Active Problems ICD-10 E11.621 - Type 2 diabetes mellitus with foot ulcer L97.522 - Non-pressure chronic ulcer of other part of left foot with fat layer exposed Plan Wound Cleansing: Wound #2 Left,Plantar Foot: Clean wound with Normal Saline. Wound #3 Left,Lateral Foot: Clean wound with Normal Saline. Anesthetic: Wound #2 Left,Plantar Foot: Topical Lidocaine 4% cream applied to wound bed prior to debridement Wound #3 Left,Lateral Foot: Topical Lidocaine 4% cream applied to wound bed prior to debridement Primary Wound Dressing: Wound #2 Left,Plantar Foot: Prisma Ag Wound #3 Left,Lateral Foot: Foam Secondary Dressing: Wound #2 Left,Plantar Foot: ABD and Kerlix/Conform - secure lightly with coban Wound #3 Left,Lateral Foot: [name], [name]  ([id number]) ABD and Kerlix/Conform - secure lightly with coban Dressing Change Frequency: Wound #2 Left,Plantar Foot: Change dressing every other day. Wound #3 Left,Lateral Foot: Change dressing every other day. Follow-up Appointments: Wound #2 Left,Plantar Foot: Return Appointment in 1 week. Wound #3 Left,Lateral Foot: Return Appointment in 1 week. Off-Loading: Wound #2 Left,Plantar Foot: Other: - front-offloader Wound #3 Left,Lateral Foot: Other: - front-offloader Additional Orders / Instructions: Wound #2 Left,Plantar Foot: Increase protein intake. Wound #3 Left,Lateral Foot: Increase protein intake. #1 at the patient's request along with her daughter the total contact cast was not replaced this week as she is traveling to visit her granddaughter on the weekend. I am not sure that this is the best decision and I informed him of this nevertheless he applied Prisma, foam Curlex Incon form. She was put in a Clinical research associate) Signed: 07/17/2016 6:05:53 PM By: Linton Ham MD Entered By: Linton Ham on 07/17/2016 17:42:53 [place], [place] ([id number]) -------------------------------------------------------------------------------- SuperBill Details Patient Name: [name], [name] Date of Service: 07/17/2016 Medical Record Patient Account Number: [id number] Number: Treating RN: Montey Hora 1945/09/29 (70 y.o. Other Clinician: Date of Birth/Sex: Female) Treating Desirae Mancusi Primary Care Physician/Extender: Ashok Croon Physician: Weeks in Treatment: 6 Referring Physician: Lanier Prude Diagnosis Coding ICD-10 Codes Code Description E11.621 Type 2 diabetes mellitus with foot ulcer L97.522 Non-pressure chronic ulcer of other part of left foot with fat layer exposed Facility Procedures CPT4 Code: [id number] Description: [facility] VISIT-LEV 3 EST PT Modifier: Quantity: 1 Physician Procedures CPT4 Code  Description: [id number] - WC PHYS LEVEL 2 - EST PT ICD-10 Description Diagnosis E11.621 Type 2 diabetes mellitus with foot ulcer L97.522 Non-pressure chronic ulcer of other part of left foo Modifier: t with fat laye Quantity: 1 r exposed Electronic Signature(s) Signed: 07/17/2016 6:05:53 PM By: Linton Ham MD Entered By: Linton Ham on 07/17/2016 17:43:14

## 2016-07-19 ENCOUNTER — Ambulatory Visit: Payer: Medicare Other | Admitting: Cardiovascular Disease

## 2016-07-19 ENCOUNTER — Encounter: Payer: Medicare Other | Admitting: Physical Therapy

## 2016-07-24 ENCOUNTER — Ambulatory Visit: Payer: Medicare Other | Admitting: Internal Medicine

## 2016-07-26 ENCOUNTER — Encounter: Payer: Medicare Other | Admitting: Physical Therapy

## 2016-07-31 ENCOUNTER — Ambulatory Visit: Payer: Medicare Other | Admitting: Internal Medicine

## 2016-08-02 ENCOUNTER — Encounter: Payer: Medicare Other | Admitting: Physical Therapy

## 2016-08-09 ENCOUNTER — Ambulatory Visit: Payer: Medicare Other | Admitting: Physical Therapy

## 2016-08-12 ENCOUNTER — Encounter: Payer: Medicare Other | Attending: Surgery | Admitting: Surgery

## 2016-08-12 ENCOUNTER — Other Ambulatory Visit
Admission: RE | Admit: 2016-08-12 | Discharge: 2016-08-12 | Disposition: A | Discharge status: Home / Self Care | Payer: Medicare Other | Source: Ambulatory Visit | Attending: Surgery | Admitting: Surgery

## 2016-08-12 ENCOUNTER — Ambulatory Visit: Payer: Medicare Other | Admitting: Surgery

## 2016-08-12 DIAGNOSIS — L97522 Non-pressure chronic ulcer of other part of left foot with fat layer exposed: Secondary | ICD-10-CM | POA: Diagnosis not present

## 2016-08-12 DIAGNOSIS — B999 Unspecified infectious disease: Secondary | ICD-10-CM | POA: Insufficient documentation

## 2016-08-12 DIAGNOSIS — Z885 Allergy status to narcotic agent status: Secondary | ICD-10-CM | POA: Diagnosis not present

## 2016-08-12 DIAGNOSIS — L02612 Cutaneous abscess of left foot: Secondary | ICD-10-CM | POA: Diagnosis not present

## 2016-08-12 DIAGNOSIS — E11621 Type 2 diabetes mellitus with foot ulcer: Secondary | ICD-10-CM | POA: Diagnosis present

## 2016-08-12 NOTE — Progress Notes (Addendum)
[name] ([id number]) Visit Report for 08/12/2016 Arrival Information Details Patient Name: [name], [name] Date of Service: 08/12/2016 12:30 PM Medical Record Number: [id number] Patient Account Number: [id number] Date of Birth/Sex: 1945-10-09 (71 y.o. Female) Treating RN: [name] Primary Care [name]: [name] Other Clinician: Referring [name]: [name] Treating [name]/Extender: [name] in Treatment: 9 Visit Information History Since Last Visit Added or deleted any medications: No Patient Arrived: Ambulatory Any new allergies or adverse reactions: No Arrival Time: 12:32 Had a fall or experienced change in No Accompanied By: dtr activities of daily living that may affect Transfer Assistance: None risk of falls: Patient Identification Verified: Yes Signs or symptoms of abuse/neglect since last No Secondary Verification Process Yes visito Completed: Hospitalized since last visit: No Patient Has Alerts: Yes Has Dressing in Place as Prescribed: Yes Patient Alerts: DMII Has Footwear/Offloading in Place as Yes Prescribed: Left: Wedge Shoe Pain Present Now: No Electronic Signature(s) Signed: 08/12/2016 4:09:53 PM By: [name] Entered By: [name] on 08/12/2016 12:33:10 [name], [name] ([id number]) -------------------------------------------------------------------------------- Encounter Discharge Information Details Patient Name: [name], [name] Date of Service: 08/12/2016 12:30 PM Medical Record Number: [id number] Patient Account Number: [id number] Date of Birth/Sex: 06/17/1946 (71 y.o. Female) Treating RN: [name] Primary Care Quentyn Kolbeck: [name] Other Clinician: Referring Cierria Height: [name] Treating Porcia Morganti/Extender: [name] in Treatment: 9 Encounter Discharge Information Items Discharge Pain Level: 0 Discharge Condition: Stable Ambulatory Status: Ambulatory Discharge Destination:  Home Transportation: Private Auto Accompanied By: dtr Schedule Follow-up Appointment: Yes Medication Reconciliation completed and provided to Patient/Care No Amatullah Christy: Provided on Clinical Summary of Care: 08/12/2016 Form Type Recipient Paper Patient BR Electronic Signature(s) Signed: 08/12/2016 1:18:57 PM By: [name] Entered By: [name] on 08/12/2016 13:18:57 [place], [place] ([id number]) -------------------------------------------------------------------------------- Lower Extremity Assessment Details Patient Name: [name], [name] Date of Service: 08/12/2016 12:30 PM Medical Record Number: [id number] Patient Account Number: [id number] Date of Birth/Sex: Dec 02, 1945 (71 y.o. Female) Treating RN: [name] Primary Care Rice Walsh: [name] Other Clinician: Referring Jodette Wik: [name] Treating Erika Slaby/Extender: [name] in Treatment: 9 Edema Assessment Assessed: [Left: No] [Right: No] Edema: [Left: N] [Right: o] Vascular Assessment Pulses: Dorsalis Pedis Palpable: [Left:Yes] Posterior Tibial Extremity colors, hair growth, and conditions: Extremity Color: [Left:Normal] Temperature of Extremity: [Left:Warm] Capillary Refill: [Left:< 3 seconds] Electronic Signature(s) Signed: 08/12/2016 4:09:53 PM By: [name] Entered By: [name] on 08/12/2016 12:47:02 [name] ([id number]) -------------------------------------------------------------------------------- Multi Wound Chart Details Patient Name: [name], [name] Date of Service: 08/12/2016 12:30 PM Medical Record Number: [id number] Patient Account Number: [id number] Date of Birth/Sex: 01/27/1946 (71 y.o. Female) Treating RN: [name] Primary Care Archita Lomeli: [name] Other Clinician: Referring Keajah Killough: [name] Treating Koree Schopf/Extender: [name] in Treatment: 9 Vital Signs Height(in): 62 Pulse(bpm): 89 Weight(lbs): 140 Blood  Pressure 179/71 (mmHg): Body Mass Index(BMI): 26 Temperature(F): 98.2 Respiratory Rate 18 (breaths/min): Photos: [2:No Photos] [3:No Photos] [N/A:N/A] Wound Location: [2:Left, Plantar Foot] [3:Left, Lateral Foot] [N/A:N/A] Wounding Event: [2:Gradually Appeared] [3:Blister] [N/A:N/A] Primary Etiology: [2:Diabetic Wound/Ulcer of the Lower Extremity] [3:Pressure Ulcer] [N/A:N/A] Secondary Etiology: [2:Pressure Ulcer] [3:Diabetic Wound/Ulcer of the Lower Extremity] [N/A:N/A] Date Acquired: [2:03/25/2016] [3:06/03/2016] [N/A:N/A] Weeks of Treatment: [2:9] [3:9] [N/A:N/A] Wound Status: [2:Open] [3:Open] [N/A:N/A] Measurements L x W x D 1x2.5x0.1 [3:0.1x0.5x0.1] [N/A:N/A] (cm) Area (cm) : [2:1.963] [3:0.039] [N/A:N/A] Volume (cm) : [2:0.196] [3:0.004] [N/A:N/A] % Reduction in Area: [2:-256.90%] [3:94.50%] [N/A:N/A] % Reduction in Volume: 28.70% [3:94.40%] [N/A:N/A] Classification: [2:Grade 2] [3:Category/Stage I] [N/A:N/A] Debridement: [2:Debridement (35701- 11047)] [3:Debridement (  56314- 97026)] [N/A:N/A] Pre-procedure [2:12:53] [3:12:48] [N/A:N/A] Verification/Time Out Taken: Pain Control: [2:Lidocaine 4% Topical Solution] [3:Lidocaine 4% Topical Solution] [N/A:N/A] Tissue Debrided: [2:Fibrin/Slough, Callus, Subcutaneous] [3:Fibrin/Slough, Skin, Subcutaneous] [N/A:N/A] Level: [2:Skin/Subcutaneous Tissue] [3:Skin/Subcutaneous Tissue] [N/A:N/A] Debridement Area (sq [2:2.5] [3:0.05] [N/A:N/A] cm): [name], [name] ([id number]) Instrument: Forceps, Scissors Forceps, Scissors N/A Specimen: Swab None N/A Number of Specimens 1 N/A N/A Taken: Bleeding: Minimum Moderate N/A Hemostasis Achieved: Pressure Silver Nitrate N/A Procedural Pain: 0 0 N/A Post Procedural Pain: 0 0 N/A Debridement Treatment Procedure was tolerated Procedure was tolerated N/A Response: well well Post Debridement 2x2.5x0.2 0.4x0.7x0.2 N/A Measurements L x W x D (cm) Post Debridement 0.785 0.044 N/A Volume:  (cm) Post Debridement N/A Category/Stage I N/A Stage: Periwound Skin Texture: No Abnormalities Noted No Abnormalities Noted N/A Periwound Skin No Abnormalities Noted No Abnormalities Noted N/A Moisture: Periwound Skin Color: No Abnormalities Noted No Abnormalities Noted N/A Tenderness on No No N/A Palpation: Procedures Performed: Debridement Debridement N/A Treatment Notes Wound #2 (Left, Plantar Foot) 1. Cleansed with: Clean wound with Normal Saline 2. Anesthetic Topical Lidocaine 4% cream to wound bed prior to debridement 4. Dressing Applied: Aquacel Ag 5. Secondary Dressing Applied Gauze and Kerlix/Conform 6. Footwear/Offloading device applied Felt/Foam 7. Secured with Tape Wound #3 (Left, Lateral Foot) 1. Cleansed with: Clean wound with Normal Saline 2. Anesthetic Topical Lidocaine 4% cream to wound bed prior to debridement 4. Dressing Applied: Aquacel Ag [place], [place] ([id number]) 5. Secondary Dressing Applied Gauze and Kerlix/Conform 6. Footwear/Offloading device applied Felt/Foam 7. Secured with Recruitment consultant) Signed: 08/12/2016 1:19:08 PM By: Christin Fudge MD, FACS Entered By: Christin Fudge on 08/12/2016 13:19:08 [place], [name] ([id number]) -------------------------------------------------------------------------------- [place] Details Patient Name: [name], [name] Date of Service: 08/12/2016 12:30 PM Medical Record Number: [id number] Patient Account Number: [id number] Date of Birth/Sex: 03-16-1946 (71 y.o. Female) Treating RN: [name] Primary Care Loralai Eisman: [name] Other Clinician: Referring Santoria Chason: [name] Treating Xara Paulding/Extender: [name] in Treatment: 9 Active Inactive ` Medication Nursing Diagnoses: Knowledge deficit related to medication safety: actual or potential Goals: Patient/caregiver will demonstrate understanding of new oral/IV medications prescribed at the [facility]  (topical prescriptions are covered under the skin breakdown problem) Date Initiated: 06/05/2016 Target Resolution Date: 10/03/2016 Goal Status: Active Interventions: Assess patient/caregiver ability to manage medication regimen upon admission and as needed Patient/Caregiver given reconciled medication list upon admission, changes in medications and discharge from the [place] Notes: ` Nutrition Nursing Diagnoses: Potential for alteratiion in Nutrition/Potential for imbalanced nutrition Goals: Patient/caregiver verbalizes understanding of need to maintain therapeutic glucose control per primary care physician Date Initiated: 06/05/2016 Target Resolution Date: 10/03/2016 Goal Status: Active Interventions: Provide education on elevated blood sugars and impact on wound healing Notes: ` Orientation to the [place], [place]. ([id number]) Nursing Diagnoses: Knowledge deficit related to the wound healing center program Goals: Patient/caregiver will verbalize understanding of the [place] [facility] Date Initiated: 06/05/2016 Target Resolution Date: 10/03/2016 Goal Status: Active Interventions: Provide education on orientation to the wound center Notes: ` Pressure Nursing Diagnoses: Knowledge deficit related to management of pressures ulcers Goals: Patient will remain free of pressure ulcers Date Initiated: 06/05/2016 Target Resolution Date: 10/03/2016 Goal Status: Active Interventions: Assess: immobility, friction, shearing, incontinence upon admission and as needed Treatment Activities: Patient referred for seating evaluation to ensure proper offloading : 06/05/2016 Notes: ` Wound/Skin Impairment Nursing Diagnoses: Impaired tissue integrity Goals: Ulcer/skin breakdown will heal within 14 weeks Date Initiated: 06/05/2016 Target Resolution Date: 10/03/2016 Goal Status:  Active Interventions: Assess patient/caregiver ability to obtain  necessary supplies Notes: KIMLEY, APSEY ([id number]) Electronic Signature(s) Signed: 08/12/2016 4:09:53 PM By: [name] Entered By: [name] on 08/12/2016 12:47:31 [place], [place] ([id number]) -------------------------------------------------------------------------------- Pain Assessment Details Patient Name: [name], [name] Date of Service: 08/12/2016 12:30 PM Medical Record Number: [id number] Patient Account Number: [id number] Date of Birth/Sex: May 09, 1946 (71 y.o. Female) Treating RN: [name] Primary Care Harley Mccartney: [name] Other Clinician: Referring Jeevan Kalla: [name] Treating Tien Aispuro/Extender: [name] in Treatment: 9 Active Problems Location of Pain Severity and Description of Pain Patient Has Paino No Site Locations Pain Management and Medication Current Pain Management: Notes Topical or injectable lidocaine is offered to patient for acute pain when surgical debridement is performed. If needed, Patient is instructed to use over the counter pain medication for the following 24-48 hours after debridement. Wound care MDs do not prescribed pain medications. Patient has chronic pain or uncontrolled pain. Patient has been instructed to make an appointment with their Primary Care Physician for pain management. Electronic Signature(s) Signed: 08/12/2016 4:09:53 PM By: [name] Entered By: [name] on 08/12/2016 12:33:32 [name], [name] ([id number]) -------------------------------------------------------------------------------- Patient/Caregiver Education Details Patient Name: Gardiner, Shanae L. Date of Service: 08/12/2016 12:30 PM Medical Record Number: [id number] Patient Account Number: [id number] Date of Birth/Gender: May 30, 1946 (71 y.o. Female) Treating RN: [name] Primary Care Physician: [name] Other Clinician: Referring Physician: Lanier [name] Treating Physician/Extender: [name] in Treatment:  9 Education Assessment Education Provided To: Patient and Caregiver Education Topics Provided Wound/Skin Impairment: Handouts: Other: wound care as ordered Methods: Demonstration, Explain/Verbal Responses: State content correctly Electronic Signature(s) Signed: 08/12/2016 4:09:53 PM By: [name] Entered By: [name] on 08/12/2016 13:02:45 [name], [name] ([id number]) -------------------------------------------------------------------------------- Wound Assessment Details Patient Name: [name], [name] Date of Service: 08/12/2016 12:30 PM Medical Record Number: [id number] Patient Account Number: [id number] Date of Birth/Sex: October 04, 1945 (71 y.o. Female) Treating RN: [name] Primary Care Teanna Elem: [name] Other Clinician: Referring Hendrick Pavich: [name] Treating Bret Vanessen/Extender: [name] in Treatment: 9 Wound Status Wound Number: 2 Primary Etiology: Diabetic Wound/Ulcer of the Lower Extremity Wound Location: Left Foot - Plantar Secondary Pressure Ulcer Wounding Event: Gradually Appeared Etiology: Date Acquired: 03/25/2016 Wound Status: Open Weeks Of Treatment: 9 Comorbid Hypertension, Type II Diabetes Clustered Wound: No History: Photos Wound Measurements Length: (cm) 1 % Reduction in A Width: (cm) 2.5 % Reduction in V Depth: (cm) 0.1 Epithelializatio Area: (cm) 1.963 Volume: (cm) 0.196 rea: -256.9% olume: 28.7% n: None Wound Description Classification: Grade 2 Wound Margin: Flat and Intact Exudate Amount: Large Exudate Type: Serous Exudate Color: amber Wound Bed Granulation Amount: Large (67-100%) Exposed Structure Granulation Quality: Red Fascia Exposed: No Necrotic Amount: None Present (0%) Fat Layer (Subcutaneous Tissue) Exposed: No Tendon Exposed: No Muscle Exposed: No [name], [name] ([id number]) Joint Exposed: No Bone Exposed: No Limited to Skin Breakdown Periwound Skin Texture Texture Color No  Abnormalities Noted: No No Abnormalities Noted: No Callus: Yes Atrophie Blanche: No Crepitus: No Cyanosis: No Excoriation: No Ecchymosis: No Induration: No Erythema: No Rash: No Hemosiderin Staining: No Scarring: No Mottled: No Pallor: No Moisture Rubor: No No Abnormalities Noted: No Dry / Scaly: No Maceration: No Wound Preparation Ulcer Cleansing: Rinsed/Irrigated with Saline Topical Anesthetic Applied: Other: lidocaine 4%, Treatment Notes Wound #2 (Left, Plantar Foot) 1. Cleansed with: Clean wound with Normal Saline 2. Anesthetic Topical Lidocaine 4% cream to wound bed prior to debridement 4. Dressing Applied: Aquacel Ag 5. Secondary Dressing Applied Gauze and  Kerlix/Conform 6. Footwear/Offloading device applied Felt/Foam 7. Secured with Recruitment consultant) Signed: 08/12/2016 4:09:53 PM By: [name] Entered By: [name] on 08/12/2016 13:43:12 [place], [place]. ([id number]) -------------------------------------------------------------------------------- Wound Assessment Details Patient Name: [name], [name] Date of Service: 08/12/2016 12:30 PM Medical Record Number: [id number] Patient Account Number: [id number] Date of Birth/Sex: [birth year] (71 y.o. Female) Treating RN: [name] Primary Care Bashir Marchetti: [name] Other Clinician: Referring Yoskar Murrillo: [name] Treating Baylon Santelli/Extender: [name] in Treatment: 9 Wound Status Wound Number: 3 Primary Etiology: Pressure Ulcer Wound Location: Left Foot - Lateral Secondary Diabetic Wound/Ulcer of the Lower Etiology: Extremity Wounding Event: Blister Wound Status: Open Date Acquired: 06/03/2016 Comorbid Hypertension, Type II Diabetes Weeks Of Treatment: 9 History: Clustered Wound: No Photos Wound Measurements Length: (cm) 0.1 Width: (cm) 0.5 Depth: (cm) 0.1 Area: (cm) 0.039 Volume: (cm) 0.004 % Reduction in Area: 94.5% % Reduction in Volume:  94.4% Epithelialization: Large (67-100%) Wound Description Classification: Category/Stage I Foul Odor A Diabetic Severity (Wagner): Grade 1 Wound Margin: Flat and Intact Exudate Amount: Large Exudate Type: Serous Exudate Color: amber fter Cleansing: No Wound Bed Granulation Amount: None Present (0%) Exposed Structure Necrotic Amount: None Present (0%) Fascia Exposed: No Fat Layer (Subcutaneous Tissue) Exposed: No Tendon Exposed: No [name], [name] ([id number]) Muscle Exposed: No Joint Exposed: No Bone Exposed: No Limited to Skin Breakdown Periwound Skin Texture Texture Color No Abnormalities Noted: No No Abnormalities Noted: No Callus: No Atrophie Blanche: No Crepitus: No Cyanosis: No Excoriation: No Ecchymosis: No Induration: No Erythema: No Rash: No Hemosiderin Staining: No Scarring: No Mottled: No Pallor: No Moisture Rubor: No No Abnormalities Noted: No Dry / Scaly: No Temperature / Pain Maceration: No Temperature: No Abnormality Wound Preparation Ulcer Cleansing: Rinsed/Irrigated with Saline Topical Anesthetic Applied: None Treatment Notes Wound #3 (Left, Lateral Foot) 1. Cleansed with: Clean wound with Normal Saline 2. Anesthetic Topical Lidocaine 4% cream to wound bed prior to debridement 4. Dressing Applied: Aquacel Ag 5. Secondary Dressing Applied Gauze and Kerlix/Conform 6. Footwear/Offloading device applied Felt/Foam 7. Secured with Recruitment consultant) Signed: 08/14/2016 11:21:08 AM By: [name] Previous Signature: 08/12/2016 4:09:53 PM Version By: [name] Entered By: [name] on 08/14/2016 11:21:08 [place], [place] ([id number]) -------------------------------------------------------------------------------- Vitals Details Patient Name: [name], [name] Date of Service: 08/12/2016 12:30 PM Medical Record Number: [id number] Patient Account Number: [id number] Date of Birth/Sex: Mar 20, 1946 (71 y.o. Female) Treating  RN: [name] Primary Care [place] Sanon: [name] Other Clinician: Referring Starletta Houchin: [name] Treating Elzora Cullins/Extender: [name] in Treatment: 9 Vital Signs Time Taken: 12:33 Temperature (F): 98.2 Height (in): 62 Pulse (bpm): 89 Weight (lbs): 140 Respiratory Rate (breaths/min): 18 Body Mass Index (BMI): 25.6 Blood Pressure (mmHg): 179/71 Reference Range: 80 - 120 mg / dl Electronic Signature(s) Signed: 08/12/2016 4:09:53 PM By: [name] Entered By: [name] on 08/12/2016 12:35:02

## 2016-08-12 NOTE — Progress Notes (Addendum)
[name], [name] ([id number]) Visit Report for 08/12/2016 Chief Complaint Document Details Patient Name: [name], [name] L. Date of Service: 08/12/2016 12:30 PM Medical Record Number: [id number] Patient Account Number: [id number] Date of Birth/Sex: 1946/04/30 (71 y.o. Female) Treating RN: Montey Hora Primary Care Provider: Lanier Prude Other Clinician: Referring Provider: Lanier Prude Treating Provider/Extender: Frann Rider in Treatment: 9 Information Obtained from: Patient Chief Complaint 06/05/16; patient is here for review of a wound on her right foot which is been present for at least a month Electronic Signature(s) Signed: 08/12/2016 1:20:24 PM By: Christin Fudge MD, FACS Entered By: Christin Fudge on 08/12/2016 13:20:24 [name], [name] L. ([id number]) -------------------------------------------------------------------------------- Debridement Details Patient Name: [name], [name] L. Date of Service: 08/12/2016 12:30 PM Medical Record Number: [id number] Patient Account Number: [id number] Date of Birth/Sex: 08-21-1945 (71 y.o. Female) Treating RN: Montey Hora Primary Care Provider: Lanier Prude Other Clinician: Referring Provider: Lanier Prude Treating Provider/Extender: Frann Rider in Treatment: 9 Debridement Performed for Wound #2 Left,Plantar Foot Assessment: Performed By: Physician Christin Fudge, MD Debridement: Debridement Pre-procedure Yes - 12:53 Verification/Time Out Taken: Start Time: 12:53 Pain Control: Lidocaine 4% Topical Solution Level: Skin/Subcutaneous Tissue Total Area Debrided (L x 1 (cm) x 2.5 (cm) = 2.5 (cm) W): Tissue and other Viable, Non-Viable, Callus, Fibrin/Slough, Subcutaneous material debrided: Instrument: Forceps, Scissors Specimen: Swab Number of Specimens 1 Taken: Bleeding: Minimum Hemostasis Achieved: Pressure End Time: 12:59 Procedural Pain: 0 Post Procedural Pain: 0 Response to Treatment: Procedure was tolerated  well Post Debridement Measurements of Total Wound Length: (cm) 2 Width: (cm) 2.5 Depth: (cm) 0.2 Volume: (cm) 0.785 Character of Wound/Ulcer Post Improved Debridement: Severity of Tissue Post Debridement: Fat layer exposed Post Procedure Diagnosis Same as Pre-procedure Notes [name], [name] ([id number]) the patient had a large abscess on the plantar aspect of her first metatarsal head associated with a callus and this was sharply removed and an incision and drainage done and culture sent Electronic Signature(s) Signed: 08/12/2016 1:20:08 PM By: Christin Fudge MD, FACS Signed: 08/12/2016 4:09:53 PM By: Montey Hora Entered By: Christin Fudge on 08/12/2016 13:20:07 [name] [name] ([id number]) -------------------------------------------------------------------------------- Debridement Details Patient Name: [name], [name] L. Date of Service: 08/12/2016 12:30 PM Medical Record Number: [id number] Patient Account Number: [id number] Date of Birth/Sex: 05-27-1946 (71 y.o. Female) Treating RN: Montey Hora Primary Care Provider: Lanier Prude Other Clinician: Referring Provider: Lanier Prude Treating Provider/Extender: Frann Rider in Treatment: 9 Debridement Performed for Wound #3 Left,Lateral Foot Assessment: Performed By: Physician Christin Fudge, MD Debridement: Debridement Pre-procedure Yes - 12:48 Verification/Time Out Taken: Start Time: 12:48 Pain Control: Lidocaine 4% Topical Solution Level: Skin/Subcutaneous Tissue Total Area Debrided (L x 0.1 (cm) x 0.5 (cm) = 0.05 (cm) W): Tissue and other Viable, Non-Viable, Fibrin/Slough, Skin, Subcutaneous material debrided: Instrument: Forceps, Scissors Bleeding: Moderate Hemostasis Achieved: Silver Nitrate End Time: 12:52 Procedural Pain: 0 Post Procedural Pain: 0 Response to Treatment: Procedure was tolerated well Post Debridement Measurements of Total Wound Length: (cm) 0.4 Stage: Category/Stage I Width: (cm)  0.7 Depth: (cm) 0.2 Volume: (cm) 0.044 Character of Wound/Ulcer Post Improved Debridement: Severity of Tissue Post Fat layer exposed Debridement: Post Procedure Diagnosis Same as Pre-procedure Electronic Signature(s) Signed: 08/12/2016 1:20:17 PM By: Christin Fudge MD, FACS Signed: 08/12/2016 4:09:53 PM By: Montey Hora Entered By: Christin Fudge on 08/12/2016 13:20:17 [name], [name] L. ([id number]) [name] [name] ([id number]) -------------------------------------------------------------------------------- HPI Details Patient Name: [name], [name] L. Date of Service: 08/12/2016 12:30 PM Medical Record Number: [id number] Patient Account Number: [id number] Date of Birth/Sex: [birth year] (70 y.o.  Female) Treating RN: Montey Hora Primary Care Provider: Lanier Prude Other Clinician: Referring Provider: Lanier Prude Treating Provider/Extender: Frann Rider in Treatment: 9 History of Present Illness HPI Description: The patient is here for followup. Denies fevers, has been compliant with wearing Darco shoes. She states that her blood sugars are still in the low 200s. [name] [name] is a 38F with a h/o DM who presents with an open ulcer on the plantar aspect of her L great toe. She has had it for close to 8 months. She has also had several debridements as well as courses of oral antibiotics. Currently, she denies fevers and has no pain. She has never had ulcers like this in the past. Her blood sugars are normally in the 170s. Foot xray was negative for osteomyelitis. HgbA1c was 10.3. READMISSION; 06/05/16; [name] is a 71 year old type II diabetic by description not well-controlled. She does not have known PAD or claudication. She has apparently some degree of neuropathy or she's been told that in the past. She has a history of a wound on the plantar left foot foot for which she was seen here in 2015 although I was not involved in her care nor were any other current physicians working  in our clinics. She was managed with standard dressings and a Darco forefoot offloading boot and she seems to have healed quite quickly. Apparently the patient noticed a callus/wound development a month ago although her daughter who is present states it was longer than that. Indeed the patient seems to been followed for at least 3 clinic visits with her podiatrist Dr. Gardiner Barefoot. On October 19 noted a necrotic ulcer on the plantar aspect of the left first metatarsal head. She was using Silvadene based dressings given Keflex on 10/9. I don't believe any cultures or x-rays were done. More importantly she doesn't seem to be adequately offloading this area in her footwear ABIs in this clinic were 0.88 bilaterally. 06/12/16; continued follow-up for a central wound over the left first metatarsal head. Once again today required an extensive debridement. She is using a Darco forefoot offloading boot. Her x-ray of the foot was negative for any bone changes. She has not yet had vascular studies. ABI in this clinic was 0.88 06/19/16; formal arterial studies for early January for her daughter. In general the major wound over the plantar left first metatarsal head looks better. Area on her lateral fifth metatarsal head looks like it's proceeding towards closure. She is wearing the Darco forefoot offloading sandal was some difficulty 06/26/16; formal arterial studies towboat for early January. The area on the plantar left first metatarsal head apparently measures 0.2 x 0.2 mm smaller. She has the same surface slough and some nonviable surrounding tissue which is debrided with a curet. This cleans up quite nicely. The area on her lateral fifth metatarsal head is still fully epithelialized 07/03/16; formal arterial studies booked for early January. The areas on the plantar left first metatarsal head smaller by about 0.1 mmo. His is not making quite is much progress as I was like. She is using a Darco Pl[place],  [place] ([id number]) forefoot offload her 07/10/16; she had her arterial studies today by Dr. [name]. I do not have the formal report however patient's daughter states she has "small vessel disease" presumably this means that there is not a revascularizable element. 07/12/2016 -- the arterial studies were done in the lab of Dr. Fletcher Anon, and he will be seeing the patient on 07/19/2016. the duplex examination showed diffuse  plaque bilaterally with a 50-74% stenosis of the right SFA. Three- vessel runoff on the right two-vessel runoff on the left. The ABI on the right was 0.89 on the left was 0.95 and the TBI on the right was 0.52 and on the left was 0.36 07/17/16; her arterial studies are noted. She had a 50-74% stenosis of the right SFA three-vessel runoff on the right two-vessel runoff on the left. Her wound is on the left first metatarsal head. She had an appointment with Dr. Fletcher Anon however that had to reschedule list. The patient is traveling over the weekend and therefore she requested not to have the total contact cast replaced. 08/12/2016 -- the patient has not been back to see [place] for the last 25 days and comes in with an abscess on the plantar aspect of her left foot and the area on the left lateral foot is also an open wound now. Electronic Signature(s) Signed: 08/12/2016 1:21:11 PM By: Christin Fudge MD, FACS Entered By: Christin Fudge on 08/12/2016 13:21:11 [place], [place]. ([id number]) -------------------------------------------------------------------------------- Physical Exam Details Patient Name: [name], [name] L. Date of Service: 08/12/2016 12:30 PM Medical Record Number: [id number] Patient Account Number: [id number] Date of Birth/Sex: 08/24/1945 (71 y.o. Female) Treating RN: Montey Hora Primary Care Provider: Lanier Prude Other Clinician: Referring Provider: Lanier Prude Treating Provider/Extender: Frann Rider in Treatment: 9 Constitutional . Pulse regular. Respirations  normal and unlabored. Afebrile. . Eyes Nonicteric. Reactive to light. Ears, Nose, Mouth, and Throat Lips, teeth, and gums WNL.Marland Kitchen Moist mucosa without lesions. Neck supple and nontender. No palpable supraclavicular or cervical adenopathy. Normal sized without goiter. Respiratory WNL. No retractions.. Breath sounds WNL, No rubs, rales, rhonchi, or wheeze.. Cardiovascular Heart rhythm and rate regular, no murmur or gallop.. Pedal Pulses WNL. No clubbing, cyanosis or edema. Chest Breasts symmetical and no nipple discharge.. Breast tissue WNL, no masses, lumps, or tenderness.. Lymphatic No adneopathy. No adenopathy. No adenopathy. Musculoskeletal Adexa without tenderness or enlargement.. Digits and nails w/o clubbing, cyanosis, infection, petechiae, ischemia, or inflammatory conditions.. Integumentary (Hair, Skin) No suspicious lesions. No crepitus or fluctuance. No peri-wound warmth or erythema. No masses.Marland Kitchen Psychiatric Judgement and insight Intact.. No evidence of depression, anxiety, or agitation.. Notes the left lateral foot has an open wound which were sharply debrided and the edges trimmed and bleeding controlled with Silver nitrate. On the medial part of her left plantar forefoot she has a large abscess which is in need of sharp debridement and I have done this and drain the fluid and sent it for culture. A lot of the callus was also removed sharply. Electronic Signature(s) Signed: 08/12/2016 1:22:18 PM By: Christin Fudge MD, FACS Entered By: Christin Fudge on 08/12/2016 13:22:17 [name], [name] Carlean Jews ([id number]) -------------------------------------------------------------------------------- Physician Orders Details Patient Name: [name], [name] L. Date of Service: 08/12/2016 12:30 PM Medical Record Number: [id number] Patient Account Number: [id number] Date of Birth/Sex: 21-Dec-1945 (71 y.o. Female) Treating RN: Montey Hora Primary Care Provider: Lanier Prude Other Clinician: Referring  Provider: Lanier Prude Treating Provider/Extender: Frann Rider in Treatment: 9 Verbal / Phone Orders: Yes Clinician: Montey Hora Read Back and Verified: Yes Diagnosis Coding Wound Cleansing Wound #2 [place] wound with Normal Saline. Wound #3 Left,Lateral Foot o Clean wound with Normal Saline. Anesthetic Wound #2 Left,Plantar Foot o Topical Lidocaine 4% cream applied to wound bed prior to debridement Wound #3 Left,Lateral Foot o Topical Lidocaine 4% cream applied to wound bed prior to debridement Primary Wound Dressing Wound #2 Left,Plantar Foot o Aquacel Ag Wound #  3 Left,Lateral Foot o Aquacel Ag Secondary Dressing Wound #2 Left,Plantar Foot o ABD and Kerlix/Conform - secure lightly with coban o Other - felt for offloading Wound #3 Left,Lateral Foot o ABD and Kerlix/Conform - secure lightly with coban o Other - felt for offloading Dressing Change Frequency Wound #2 Left,Plantar Foot o Change dressing every day. Wound #3 Left,Lateral Foot o Change dressing every day. Rossin, [name] [name] ([id number]) Follow-up Appointments Wound #2 Left,Plantar Foot o Other: - on Wednesday Wound #3 Left,Lateral Foot o Other: - on Wednesday Off-Loading Wound #2 Left,Plantar Foot o Other: - front-offloader Wound #3 Left,Lateral Foot o Other: - front-offloader Additional Orders / Instructions Wound #2 Left,Plantar Foot o Increase protein intake. Wound #3 Left,Lateral Foot o Increase protein intake. Laboratory o Bacteria identified in Wound by Culture (MICRO) - left plantar foot oooo LOINC Code: 2355-7 DUKG Convenience Name: Wound culture routine Patient Medications Allergies: codeine, seasonal allergies Notifications Medication Indication Start End Augmentin 08/12/2016 DOSE oral 875 mg-125 mg tablet - tablet oral bid Electronic Signature(s) Signed: 08/12/2016 4:07:26 PM By: Christin Fudge MD, FACS Signed: 08/12/2016  4:09:53 PM By: Montey Hora Previous Signature: 08/12/2016 1:18:06 PM Version By: Christin Fudge MD, FACS Entered By: Montey Hora on 08/12/2016 13:20:01 [place], Millard. ([id number]) -------------------------------------------------------------------------------- Problem List Details Patient Name: Strickling, Kynnedi L. Date of Service: 08/12/2016 12:30 PM Medical Record Number: [id number] Patient Account Number: [id number] Date of Birth/Sex: March 21, 1946 (71 y.o. 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 ([id number]) -------------------------------------------------------------------------------- Progress Note Details Patient Name: Wilborn, Tenasia L. Date of Service: 08/12/2016 12:30 PM Medical Record Number: [id number] Patient Account Number: [id number] Date of Birth/Sex: 15-Apr-1946 (71 y.o. Female) Treating RN: Montey Hora Primary Care Provider: Lanier Prude Other Clinician: Referring Provider: Lanier Prude Treating Provider/Extender: Frann Rider in Treatment: 9 Subjective Chief Complaint Information obtained from Patient 06/05/16; patient is here for review of a wound on her right foot which is been present for at least a month History of Present Illness (HPI) The patient is here for followup. Denies fevers, has been compliant with wearing Darco shoes. She states that  her blood sugars are still in the low 200s. Natale is a 8F with a h/o DM who presents with an open ulcer on the plantar aspect of her L great toe. She has had it for close to 8 months. She has also had several debridements as well as courses of oral antibiotics. Currently, she denies fevers and has no pain. She has never had ulcers like this in the past. Her blood sugars are normally in the 170s. Foot xray was negative for osteomyelitis. HgbA1c was 10.3. READMISSION; 06/05/16; Mrs. Jakel is a 71 year old type II diabetic by description not well-controlled. She does not have known PAD or claudication. She has apparently some degree of neuropathy or she's been told that in the past. She has a history of a wound on the plantar left foot foot for which she was seen here in 2015 although I was not involved in her care nor were  any other current physicians working in our clinics. She was managed with standard dressings and a Darco forefoot offloading boot and she seems to have healed quite quickly. Apparently the patient noticed a callus/wound development a month ago although her daughter who is present states it was longer than that. Indeed the patient seems to been followed for at least 3 clinic visits with her podiatrist Dr. Gardiner Barefoot. On October 19 noted a necrotic ulcer on the plantar aspect of the left first metatarsal head. She was using Silvadene based dressings given Keflex on 10/9. I don't believe any cultures or x-rays were done. More importantly she doesn't seem to be adequately offloading this area in her footwear ABIs in this clinic were 0.88 bilaterally. 06/12/16; continued follow-up for a central wound over the left first metatarsal head. Once again today required an extensive debridement. She is using a Darco forefoot offloading boot. Her x-ray of the foot was negative for any bone changes. She has not yet had vascular studies. ABI in this clinic was 0.88 06/19/16; formal  arterial studies for early January for her daughter. In general the major wound over the plantar left first metatarsal head looks better. Area on her lateral fifth metatarsal head looks like it's proceeding towards closure. She is wearing the Darco forefoot offloading sandal was some difficulty Lamons, Jazman L. ([id number]) 06/26/16; formal arterial studies towboat for early January. The area on the plantar left first metatarsal head apparently measures 0.2 x 0.2 mm smaller. She has the same surface slough and some nonviable surrounding tissue which is debrided with a curet. This cleans up quite nicely. The area on her lateral fifth metatarsal head is still fully epithelialized 07/03/16; formal arterial studies booked for early January. The areas on the plantar left first metatarsal head smaller by about 0.1 mm. His is not making quite is much progress as I was like. She is using a Darco forefoot offload her 07/10/16; she had her arterial studies today by Dr. [name]. I do not have the formal report however patient's daughter states she has "small vessel disease" presumably this means that there is not a revascularizable element. 07/12/2016 -- the arterial studies were done in the lab of Dr. Fletcher Anon, and he will be seeing the patient on 07/19/2016. the duplex examination showed diffuse plaque bilaterally with a 50-74% stenosis of the right SFA. Three- vessel runoff on the right two-vessel runoff on the left. The ABI on the right was 0.89 on the left was 0.95 and the TBI on the right was 0.52 and on the left was 0.36 07/17/16; her arterial studies are noted. She had a 50-74% stenosis of the right SFA three-vessel runoff on the right two-vessel runoff on the left. Her wound is on the left first metatarsal head. She had an appointment with Dr. Fletcher Anon however that had to reschedule list. The patient is traveling over the weekend and therefore she requested not to have the total contact cast  replaced. 08/12/2016 -- the patient has not been back to see [place] for the last 25 days and comes in with an abscess on the plantar aspect of her left foot and the area on the left lateral foot is also an open wound now. Objective Constitutional Pulse regular. Respirations normal and unlabored. Afebrile. Vitals Time Taken: 12:33 PM, Height: 62 in, Weight: 140 lbs, BMI: 25.6, Temperature: 98.2 F, Pulse: 89 bpm, Respiratory Rate: 18 breaths/min, Blood Pressure: 179/71 mmHg. Eyes Nonicteric. Reactive to light. Ears, Nose, Mouth, and Throat Lips, teeth,  and gums WNL.Marland Kitchen Moist mucosa without lesions. Neck supple and nontender. No palpable supraclavicular or cervical adenopathy. Normal sized without goiter. Respiratory WNL. No retractions.. Breath sounds WNL, No rubs, rales, rhonchi, or wheeze.Marland Kitchen Basil, Piru ([id number]) Cardiovascular Heart rhythm and rate regular, no murmur or gallop.. Pedal Pulses WNL. No clubbing, cyanosis or edema. Chest Breasts symmetical and no nipple discharge.. Breast tissue WNL, no masses, lumps, or tenderness.. Lymphatic No adneopathy. No adenopathy. No adenopathy. Musculoskeletal Adexa without tenderness or enlargement.. Digits and nails w/o clubbing, cyanosis, infection, petechiae, ischemia, or inflammatory conditions.Marland Kitchen Psychiatric Judgement and insight Intact.. No evidence of depression, anxiety, or agitation.. General Notes: the left lateral foot has an open wound which were sharply debrided and the edges trimmed and bleeding controlled with Silver nitrate. On the medial part of her left plantar forefoot she has a large abscess which is in need of sharp debridement and I have done this and drain the fluid and sent it for culture. A lot of the callus was also removed sharply. Integumentary (Hair, Skin) No suspicious lesions. No crepitus or fluctuance. No peri-wound warmth or erythema. No masses.. Wound #2 status is Open. Original cause of wound was Gradually  Appeared. The wound is located on the [place]. The wound measures 1cm length x 2.5cm width x 0.1cm depth; 1.963cm^2 area and 0.196cm^3 volume. The wound is limited to skin breakdown. There is a large amount of serous drainage noted. The wound margin is flat and intact. There is large (67-100%) red granulation within the wound bed. There is no necrotic tissue within the wound bed. The periwound skin appearance exhibited: Callus. The periwound skin appearance did not exhibit: Crepitus, Excoriation, Induration, Rash, Scarring, Dry/Scaly, Maceration, Atrophie Blanche, Cyanosis, Ecchymosis, Hemosiderin Staining, Mottled, Pallor, Rubor, Erythema. Wound #3 status is Open. Original cause of wound was Blister. The wound is located on the Left,Lateral Foot. The wound measures 0.1cm length x 0.5cm width x 0.1cm depth; 0.039cm^2 area and 0.004cm^3 volume. The wound is limited to skin breakdown. There is a large amount of serous drainage noted. The wound margin is flat and intact. There is no granulation within the wound bed. There is no necrotic tissue within the wound bed. The periwound skin appearance did not exhibit: Callus, Crepitus, Excoriation, Induration, Rash, Scarring, Dry/Scaly, Maceration, Atrophie Blanche, Cyanosis, Ecchymosis, Hemosiderin Staining, Mottled, Pallor, Rubor, Erythema. Periwound temperature was noted as No Abnormality. Assessment Active Problems ICD-10 [name] [name], LACASSE. ([id number]) E11.621 - Type 2 diabetes mellitus with foot ulcer L97.522 - Non-pressure chronic ulcer of other part of left foot with fat layer exposed L02.612 - Cutaneous abscess of left foot The patient and her caregiver at the bedside have had a thorough discussion with me regarding the present condition of her foot and her noncompliance over the last month. I have recommended: 1. Dressing the wound daily, with silver alginate and offloading as before with a Darco front offloading shoe. 2. I put her  on Augmentin 1 twice a day for 7 days empirically after taking cultures. 3. to scheduled her appointment with Dr. Fletcher Anon regarding her vascular flow. 4. good control of her diabetes mellitus and to see her PCP as soon as possible 5. Will follow-up with Dr. Dellia Nims later this week. Procedures Wound #2 Wound #2 is a Diabetic Wound/Ulcer of the Lower Extremity located on the Left,Plantar Foot . There was a Skin/Subcutaneous Tissue Debridement ([id number]) debridement with total area of 2.5 sq cm performed by Christin Fudge, MD. with the following instrument(s): Forceps and Scissors to  remove Viable and Non- Viable tissue/material including Fibrin/Slough, Callus, and Subcutaneous after achieving pain control using Lidocaine 4% Topical Solution. 1 Specimen was taken by a Swab and sent to the lab per facility protocol.A time out was conducted at 12:53, prior to the start of the procedure. A Minimum amount of bleeding was controlled with Pressure. The procedure was tolerated well with a pain level of 0 throughout and a pain level of 0 following the procedure. Post Debridement Measurements: 2cm length x 2.5cm width x 0.2cm depth; 0.785cm^3 volume. Character of Wound/Ulcer Post Debridement is improved. Severity of Tissue Post Debridement is: Fat layer exposed. Post procedure Diagnosis Wound #2: Same as Pre-Procedure General Notes: the patient had a large abscess on the plantar aspect of her first metatarsal head associated with a callus and this was sharply removed and an incision and drainage done and culture sent. Wound #3 Wound #3 is a Pressure Ulcer located on the Left,Lateral Foot . There was a Skin/Subcutaneous Tissue Debridement ([id number]) debridement with total area of 0.05 sq cm performed by Christin Fudge, MD. with the following instrument(s): Forceps and Scissors to remove Viable and Non-Viable tissue/material including Fibrin/Slough, Skin, and Subcutaneous after achieving pain control  using Lidocaine 4% Topical Solution. A time out was conducted at 12:48, prior to the start of the procedure. A Moderate amount of bleeding was controlled with Silver Nitrate. The procedure was tolerated well with a pain level of 0 throughout and a pain level of 0 following the procedure. Post Debridement Measurements: 0.4cm length x 0.7cm width x 0.2cm depth; 0.044cm^3 volume. Post debridement Stage noted as Category/Stage I. Character of Wound/Ulcer Post Debridement is improved. Severity of Tissue Post Debridement is: Fat layer [name] [name], [name] L. ([id number]) exposed. Post procedure Diagnosis Wound #3: Same as Pre-Procedure Plan Wound Cleansing: Wound #2 Left,Plantar Foot: Clean wound with Normal Saline. Wound #3 Left,Lateral Foot: Clean wound with Normal Saline. Anesthetic: Wound #2 Left,Plantar Foot: Topical Lidocaine 4% cream applied to wound bed prior to debridement Wound #3 Left,Lateral Foot: Topical Lidocaine 4% cream applied to wound bed prior to debridement Primary Wound Dressing: Wound #2 Left,Plantar Foot: Aquacel Ag Wound #3 Left,Lateral Foot: Aquacel Ag Secondary Dressing: Wound #2 Left,Plantar Foot: ABD and Kerlix/Conform - secure lightly with coban Other - felt for offloading Wound #3 Left,Lateral Foot: ABD and Kerlix/Conform - secure lightly with coban Other - felt for offloading Dressing Change Frequency: Wound #2 Left,Plantar Foot: Change dressing every day. Wound #3 Left,Lateral Foot: Change dressing every day. Follow-up Appointments: Wound #2 Left,Plantar Foot: Other: - on Wednesday Wound #3 Left,Lateral Foot: Other: - on Wednesday Off-Loading: Wound #2 Left,Plantar Foot: Other: - front-offloader Wound #3 Left,Lateral Foot: Other: - front-offloader Additional Orders / Instructions: Wound #2 Left,Plantar Foot: Increase protein intake. Johnsen, [place] ([id number]) Wound #3 Left,Lateral Foot: Increase protein intake. Laboratory ordered were: Wound  culture routine - left plantar foot The following medication(s) was prescribed: Augmentin oral 875 mg-125 mg tablet tablet oral bid starting 08/12/2016 The patient and her caregiver at the bedside have had a thorough discussion with me regarding the present condition of her foot and her noncompliance over the last month. I have recommended: 1. Dressing the wound daily, with silver alginate and offloading as before with a Darco front offloading shoe. 2. I put her on Augmentin 1 twice a day for 7 days empirically after taking cultures. 3. to scheduled her appointment with Dr. Fletcher Anon regarding her vascular flow. 4. good control of her diabetes mellitus and to see her PCP  as soon as possible 5. Will follow-up with Dr. Dellia Nims later this week. Electronic Signature(s) Signed: 08/15/2016 4:25:27 PM By: Christin Fudge MD, FACS Previous Signature: 08/12/2016 4:13:14 PM Version By: Christin Fudge MD, FACS Previous Signature: 08/12/2016 1:24:58 PM Version By: Christin Fudge MD, FACS Entered By: Christin Fudge on 08/15/2016 16:25:27 Osborn, Milcah LMarland Kitchen ([id number]) -------------------------------------------------------------------------------- SuperBill Details Patient Name: Ketcher, Talecia L. Date of Service: 08/12/2016 Medical Record Number: [id number] Patient Account Number: [id number] Date of Birth/Sex: January 14, 1946 (71 y.o. Female) Treating RN: Montey Hora Primary Care Provider: Lanier Prude Other Clinician: Referring Provider: Lanier Prude Treating Provider/Extender: Christin Fudge Service Line: Outpatient Weeks in Treatment: 9 Diagnosis Coding ICD-10 Codes Code Description E11.621 Type 2 diabetes mellitus with foot ulcer L97.522 Non-pressure chronic ulcer of other part of left foot with fat layer exposed L02.612 Cutaneous abscess of left foot Facility Procedures CPT4 Code Description: [id number] 11042 - DEB SUBQ TISSUE 20 SQ CM/< ICD-10 Description Diagnosis E11.621 Type 2 diabetes mellitus with foot ulcer  L97.522 Non-pressure chronic ulcer of other part of left foot L02.612 Cutaneous abscess of left foot Modifier: with fat lay Quantity: 1 er exposed Physician Procedures CPT4 Code Description: [id number] 99213 - WC PHYS LEVEL 3 - EST PT ICD-10 Description Diagnosis E11.621 Type 2 diabetes mellitus with foot ulcer L97.522 Non-pressure chronic ulcer of other part of left foot L02.612 Cutaneous abscess of left foot Modifier: 25 with fat laye Quantity: 1 r exposed CPT4 Code Description: 5300511 02111 - WC PHYS SUBQ TISS 20 SQ CM ICD-10 Description Diagnosis E11.621 Type 2 diabetes mellitus with foot ulcer L97.522 Non-pressure chronic ulcer of other part of left foot L02.612 Cutaneous abscess of left foot Modifier: with fat laye Quantity: 1 r exposed Electronic Signature(s) CLEATUS, GOODIN ([id number]) Signed: 08/12/2016 4:13:14 PM By: Christin Fudge MD, FACS Previous Signature: 08/12/2016 1:25:19 PM Version By: Christin Fudge MD, FACS Entered By: Christin Fudge on 08/12/2016 16:12:13

## 2016-08-16 ENCOUNTER — Encounter: Payer: Self-pay | Admitting: Physical Therapy

## 2016-08-16 ENCOUNTER — Ambulatory Visit: Payer: Medicare Other | Attending: General Practice | Admitting: Physical Therapy

## 2016-08-16 DIAGNOSIS — M6281 Muscle weakness (generalized): Secondary | ICD-10-CM | POA: Insufficient documentation

## 2016-08-16 DIAGNOSIS — R2689 Other abnormalities of gait and mobility: Secondary | ICD-10-CM | POA: Diagnosis present

## 2016-08-16 LAB — AEROBIC CULTURE  (SUPERFICIAL SPECIMEN)

## 2016-08-16 LAB — AEROBIC CULTURE W GRAM STAIN (SUPERFICIAL SPECIMEN)

## 2016-08-16 NOTE — Patient Instructions (Addendum)
Stretches for the L low back  _ use feet to digging into bed to scoot hips to the L side, (do not lift head to move)    Drop knees to the R side pillow between legs and under knee  5 breaths here,  Use a towel under the L thigh , holding in R hand to deepen the stretch; pull thigh to towards chest to lengthen L low back    _______________   Stretch for mid back   Open book (handout)    ______________  Two ways to strengthen your belly:   1.  You are now ready to begin training the deep core muscles system: diaphragm, transverse abdominis, pelvic floor . These muscles must work together as a team.        The key to these exercises to train the brain to coordinate the timing of these muscles and to have them turn on for long periods of time to hold you upright against gravity (especially important if you are on your feet all day).These muscles are postural muscles and play a role stabilizing your spine and bodyweight. By doing these repetitions slowly and correctly instead of doing crunches, you will achieve a flatter belly without a lower pooch. You are also placing your spine in a more neutral position and breathing properly which in turn, decreases your risk for problems related to your pelvic floor, abdominal, and low back such as pelvic organ prolapse, hernias, diastasis recti (separation of superficial muscles), disk herniations, spinal fractures. These exercises set a solid foundation for you to later progress to resistance/ strength training with therabands and weights and return to other typical fitness exercises with a stronger deeper core.   Do level 1 : 10 reps Do level 2: 10 reps (left and right = 1 rep) x 3 sets , 2 x day Do not progress to level 3 for 3-4 weeks. You know you are ready when you do not have any rocking of pelvis nor arching in your back     2. Log rolling out of bed instead of bending forward   __________  Wear the clog shoe on the R  Foot when  having to walk or on your feet long periods,  Stretches upon getting home to decrease low back muscle tensions

## 2016-08-17 NOTE — Therapy (Addendum)
[place] MAIN [facility] SERVICES [address] [place], [place], [address] Phone: [phone]   Fax:  [phone]  Physical Therapy Evaluation  Patient Details  Name: [name] MRN: [id number] Date of Birth: 06/28/1946 Referring Provider: Lanier Prude, PA   Encounter Date: 08/16/2016      PT End of Session - 08/17/16 0226    Visit Number 1   Number of Visits 12   Date for PT Re-Evaluation 11/08/16   Authorization Type g code    PT Start Time 1025   PT Stop Time 1110   PT Time Calculation (min) 55 min   Activity Tolerance Patient tolerated treatment well;No increased pain   Behavior During Therapy WFL for tasks assessed/performed      Past Medical History:  Diagnosis Date  . Allergy   . Cataract   . Diabetes mellitus without complication [facility])     Past Surgical History:  Procedure Laterality Date  . callous removal  Left 2017   located on 1st digit on L foot 2/2 diabetes    There were no vitals filed for this visit.       Subjective Assessment - 08/17/16 0221    Subjective Pt reports she started having LBP two months ago. Pt had no prior LBP in the past. Pain is at a level of 6/10. It occurs intermittently and is located at lowback region bilaterally without radiating pain. Around the same time, two months ago, pt was treated for a foot wound 2/2 diabetes and was advised to not put weight on her L foot. Pt wore an elevated boot, later progress  to a cast.  Pt underwent callous removal surgery  on 08/12/16.  Currently she is wearing a smaller load-bearing boot which is higher from the ground compared to her previous boot and cast.  Her whole lifetsyle has changed after being on an order to not put weight on her L foot.  Before the wound, pt was performing yoga, walking, weights, stretching.  Pt now has modified her fitness routine which includes seated yoga and stretching to not put weight on her foot.  Pt's wound care physician: Dr.  Constance Holster. Her weight bearing restriction is for 12-13 weeks starting 08/12/16 and she will be seeing him next Tuesday.  Pt has been off her L foot since 06/07/17.  Denied urinary incontinence nor changes in bowel movements.    Pertinent History 2 vaginal deliveries. Retired and enjoys traveling   Patient Stated Goals pt would like to return to being normal again, cook, drive, walking 1 hr.              [facility] PT Assessment - 08/16/16 1039      Assessment   Medical Diagnosis Low back pain   Referring Provider Lanier Prude, PA      Precautions   Precautions None     Restrictions   Weight Bearing Restrictions Yes   LUE Weight Bearing --  full WBing restricted due to callous removal from foot wound     Balance Screen   Has the patient fallen in the past 6 months Yes     [place] residence   Home Access Stairs to enter  2 step, Rail     Prior Function   Level of Independence Independent     Coordination   Gross Motor Movements are Fluid and Coordinated --  limited diaphragmatic excursion, minimal chest breathing     Posture/Postural Control  Posture Comments abdominal bulging with leg movements, lumbopelvic perturbations with leg movements     Palpation   SI assessment  standing, L PSIS higher than R PSIS  ~1.5 in inch. Boot is 2in from the floor.     Ambulation/Gait   Gait Pattern --  decreased trunk/ arm swing, decreased stance phase on L    Gait velocity 0.5 m/s                  Pelvic Floor Special Questions - 08/17/16 0222    Diastasis Recti 3 fingers width above and below umbilicus (post Tx: 2 fingers)           [facility] Adult PT Treatment/Exercise - 08/17/16 0221      Ambulation/Gait   Gait Pattern --  decreased trunk/ arm swing, decreased stance phase on L    Gait velocity 0.5 m/s      Posture/Postural Control   Posture Comments abdominal bulging with leg movements, lumbopelvic perturbations with leg movements      Therapeutic Activites    Therapeutic Activities --  see pt instructions     Manual Therapy   Manual therapy comments quadriped position: fascial pulling over abdomen with UE extension, trunk rotation to faciliate less abdominal separation                PT Education - 08/16/16 1121    Education provided Yes   Education Details POc, anatomy, physiology, HEP, goals   Person(s) Educated Patient   Methods Explanation;Demonstration;Tactile cues;Verbal cues;Handout   Comprehension Returned demonstration;Verbalized understanding             PT Long Term Goals - 08/19/16 2130      PT LONG TERM GOAL #1   Title Pt will demo decreased SIJ obliquities and less leg length difference with proper R shoe lift in order to walk with less deviations   Time 12   Period Weeks   Status New     PT LONG TERM GOAL #2   Title Pt will demo decreased abdominal separation from 3 fingers width to < 2 fingers width across 2 visits in order to decrease back pain and increased IAP for fitness routines   Time 12   Period Weeks   Status New     PT LONG TERM GOAL #3   Title Pt will decrease her ODI score from   % to  <  % in order to return to ADLs, walking, and traveling   Time 12   Period Weeks   Status New                  Plan - 08/17/16 0227    Clinical Impression Statement Pt is a 71 yo female who c/o LBP for the past 2 months when she starting wearing foot prosthetics and being on decreased weight bearing restrictions 2/2 diabetic foot sore on her L foot. Pt's L foot is 2 inches from the ground while wearing her boot. This leg length discrepancy is associated with pelvic girdle misalignment, decreased gait speed, and gait deviations ( minimal trunk rotation, arm swing, decreased L stance phase and hip ROM). Additional contributing factors to her LBP is diastasis recti which today's Tx is starting to address in order to help pt gain a more effective intraabdominal pressure system  for postural control.Following Tx today, pt's abdominal separation decreased from 3 fingers width to 2 and pt demo'd proper deep core co-activation for deep core strengthening. Pt was also educated  on body mechanics to minimize abdominal /back strain.   Pt would benefit from a MD order for a shoe lift on R foot in order to address the leg length discrepancy caused by her current off-loading boot on LLE.   Anticipate both shoe lift and deep core strengthening will help address her LBP and minimize risks for further sacroiliac joint or LE issues.      Rehab Potential Good but has  financial restraints   PT Frequency 1x / week   PT Duration 12 weeks   PT Treatment/Interventions ADLs/Self Care Home Management;Aquatic Therapy;Moist Heat;Stair training;Gait training;Functional mobility training;Therapeutic exercise;Therapeutic activities;Patient/family education;Balance training;Neuromuscular re-education;Manual techniques;Manual lymph drainage;Taping;Scar mobilization   Consulted and Agree with Plan of Care Patient      Patient will benefit from skilled therapeutic intervention in order to improve the following deficits and impairments:  Abnormal gait, Hypomobility, Increased muscle spasms, Decreased balance, Decreased scar mobility, Decreased range of motion, Decreased activity tolerance, Decreased coordination, Decreased safety awareness, Pain, Improper body mechanics, Decreased strength, Postural dysfunction, Decreased mobility, Decreased endurance  Visit Diagnosis: Other abnormalities of gait and mobility  Muscle weakness (generalized)      G-Codes - 2016/09/01 0230    Functional Assessment Tool Used clincial judgement, ODI   Functional Limitation Mobility: Walking and moving around   Mobility: Walking and Moving Around Current Status [phone]) At least 40 percent but less than 60 percent impaired, limited or restricted   Mobility: Walking and Moving Around Goal Status [phone]) At least 20  percent but less than 40 percent impaired, limited or restricted       Problem List There are no active problems to display for this patient.   Jerl Mina ,PT, DPT, E-RYT  09-01-2016, 2:30 AM  [place] MAIN [facility] SERVICES [address] [place], [place], [address] Phone: [phone]   Fax:  [phone]  Name: [name] MRN: [id number] Date of Birth: July 11, 1945

## 2016-08-20 ENCOUNTER — Encounter: Payer: Medicare Other | Admitting: Internal Medicine

## 2016-08-20 DIAGNOSIS — E11621 Type 2 diabetes mellitus with foot ulcer: Secondary | ICD-10-CM | POA: Diagnosis not present

## 2016-08-21 NOTE — Progress Notes (Signed)
[name], [name] ([id number]) Visit Report for 08/20/2016 Arrival Information Details Patient Name: [name] [name]. Date of Service: 08/20/2016 1:30 PM Medical Record Patient Account Number: [id number] Number: Treating RN: Montey Hora 10-02-1945 (70 y.o. Other Clinician: Date of Birth/Sex: Female) Treating [name] Primary Care [name]: [name] [name]/Extender: G Referring [name]: [name] in Treatment: 10 Visit Information History Since Last Visit Added or deleted any medications: No Patient Arrived: Ambulatory Any new allergies or adverse reactions: No Arrival Time: 13:43 Had a fall or experienced change in No Accompanied By: dtr activities of daily living that may affect Transfer Assistance: None risk of falls: Patient Identification Verified: Yes Signs or symptoms of abuse/neglect since last No Secondary Verification Process Yes visito Completed: Hospitalized since last visit: No Patient Has Alerts: Yes Has Dressing in Place as Prescribed: Yes Patient Alerts: DMII Pain Present Now: No Electronic Signature(s) Signed: 08/20/2016 4:56:13 PM By: Montey Hora Entered By: Montey Hora on 08/20/2016 13:44:14 [place], [name]. ([id number]) -------------------------------------------------------------------------------- Encounter Discharge Information Details Patient Name: [name] [name]. Date of Service: 08/20/2016 1:30 PM Medical Record Patient Account Number: [id number] Number: Treating RN: Montey Hora [birth year] (70 y.o. Other Clinician: Date of Birth/Sex: Female) Treating [name] Primary Care Manika Hast: [name] Shantea Poulton/Extender: G Referring Tawney Vanorman: [name] in Treatment: 10 Encounter Discharge Information Items Discharge Pain Level: 0 Discharge Condition: Stable Ambulatory Status: Ambulatory Discharge Destination: Home Transportation: Private Auto Accompanied By: dtr Schedule  Follow-up Appointment: Yes Medication Reconciliation completed and provided to Patient/Care No [name]: Provided on Clinical Summary of Care: 08/20/2016 Form Type Recipient Paper Patient BR Electronic Signature(s) Signed: 08/20/2016 2:22:36 PM By: [name] Entered By: [name] on 08/20/2016 14:22:35 [place], [place] ([id number]) -------------------------------------------------------------------------------- Lower Extremity Assessment Details Patient Name: [name] [name]. Date of Service: 08/20/2016 1:30 PM Medical Record Patient Account Number: [id number] Number: Treating RN: Montey Hora 05-Oct-1945 (70 y.o. Other Clinician: Date of Birth/Sex: Female) Treating [name] Primary Care [name]: [name] [name]/Extender: G Referring [name]: [name] in Treatment: 10 Vascular Assessment Pulses: Dorsalis Pedis Palpable: [Left:Yes] Posterior Tibial Extremity colors, hair growth, and conditions: Extremity Color: [Left:Normal] Hair Growth on Extremity: [Left:Yes] Temperature of Extremity: [Left:Warm] Capillary Refill: [Left:< 3 seconds] Electronic Signature(s) Signed: 08/20/2016 4:56:13 PM By: Montey Hora Entered By: Montey Hora on 08/20/2016 14:07:24 [name], [name]. ([id number]) -------------------------------------------------------------------------------- Multi Wound Chart Details Patient Name: [name] [name]. Date of Service: 08/20/2016 1:30 PM Medical Record Patient Account Number: [id number] Number: Treating RN: Montey Hora 07-18-1945 (70 y.o. 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 Secondary Etiology: Pressure Ulcer Diabetic Wound/Ulcer of N/A the Lower Extremity Comorbid History: Hypertension, Type II Hypertension, Type II N/A Diabetes Diabetes Date Acquired: 03/25/2016 06/03/2016 N/A Weeks of Treatment: 10 10 N/A Wound Status: Open Open N/A Measurements [name] x W x D 0.5x0.1x0.1 0.1x0.4x0.1 N/A (cm) Area (cm) : 0.039 0.031 N/A Volume (cm) : 0.004 0.003 N/A % Reduction in Area: 92.90% 95.60% N/A % Reduction in Volume: 98.50% 95.80% N/A Classification: Grade 2 Category/Stage II N/A HBO Classification: N/A Grade 1 N/A Exudate Amount: Large  Large N/A Exudate Type: Serous Serous N/A [name] [name]. ([id number]) Exudate Color: amber amber N/A Wound Margin: Flat and Intact Flat and Intact N/A Granulation Amount: Large (67-100%) Large (67-100%) N/A Granulation Quality: Red Pink N/A Necrotic Amount: None Present (0%) None Present (0%) N/A Exposed Structures: Fascia: No Fascia: No N/A Fat Layer (Subcutaneous Fat Layer (Subcutaneous Tissue) Exposed: No Tissue) Exposed: No Tendon: No Tendon: No Muscle: No Muscle: No Joint: No Joint: No Bone: No Bone: No Limited to Skin Limited to Skin Breakdown Breakdown Epithelialization: Small (1-33%) Large (67-100%) N/A Debridement: Debridement ([id number] Debridement ([id number]- N/A 11047) 11047) Pre-procedure 14:05 14:07 N/A Verification/Time Out Taken: Pain Control: Lidocaine 4% Topical Lidocaine 4% Topical N/A Solution Solution Tissue Debrided: Fibrin/Slough, Callus, Fibrin/Slough, Callus, N/A Subcutaneous Subcutaneous Level: Skin/Subcutaneous Skin/Subcutaneous N/A Tissue Tissue Debridement Area (sq 0.05 0.04 N/A cm): Instrument: Curette Curette N/A Bleeding: Minimum Moderate  N/A Hemostasis Achieved: Pressure Silver Nitrate N/A Procedural Pain: 0 0 N/A Post Procedural Pain: 0 0 N/A Debridement Treatment Procedure was tolerated Procedure was tolerated N/A Response: well well Post Debridement 0.5x0.1x0.1 0.4x0.1x0.2 N/A Measurements [name] x W x D (cm) Post Debridement 0.004 0.006 N/A Volume: (cm) Post Debridement N/A Category/Stage II N/A Stage: Periwound Skin Texture: Callus: Yes Excoriation: No N/A Excoriation: No Induration: No Induration: No Callus: No Crepitus: No Crepitus: No Rash: No Rash: No Scarring: No Scarring: No Periwound Skin Maceration: No Maceration: No N/A Moisture: Dry/Scaly: No Dry/Scaly: No Bentsen, Brittie [name]. ([id number]) Periwound Skin Color: Atrophie Blanche: No Atrophie Blanche: No N/A Cyanosis: No Cyanosis: No Ecchymosis: No Ecchymosis: No Erythema: No Erythema: No Hemosiderin Staining: No Hemosiderin Staining: No Mottled: No Mottled: No Pallor: No Pallor: No Rubor: No Rubor: No Temperature: N/A No Abnormality N/A Tenderness on No No N/A Palpation: Wound Preparation: Ulcer Cleansing: Ulcer Cleansing: N/A Rinsed/Irrigated with Rinsed/Irrigated with Saline Saline Topical Anesthetic Topical Anesthetic Applied: Other: lidocaine Applied: None 4% Procedures Performed: Debridement Debridement N/A Treatment Notes Wound #2 (Left, Plantar Foot) 1. Cleansed with: Clean wound with Normal Saline 2. Anesthetic Topical Lidocaine 4% cream to wound bed prior to debridement 4. Dressing Applied: Aquacel Ag 5. Secondary Dressing Applied Gauze and Kerlix/Conform 6. Footwear/Offloading device applied Felt/Foam 7. Secured with Tape Wound #3 (Left, Lateral Foot) 1. Cleansed with: Clean wound with Normal Saline 2. Anesthetic Topical Lidocaine 4% cream to wound bed prior to debridement 4. Dressing Applied: Aquacel Ag 5. Secondary Dressing Applied Gauze and Kerlix/Conform 6. Footwear/Offloading device  applied Felt/Foam 7. Secured with Tape [name] [name] ([id number]) Electronic Signature(s) Signed: 08/21/2016 7:47:19 AM By: Linton Ham MD Entered By: Linton Ham on 08/20/2016 14:36:40 [place], [name] ([id number]) -------------------------------------------------------------------------------- [place] Details Patient Name: [name] [name]. Date of Service: 08/20/2016 1:30 PM Medical Record Patient Account Number: [id number] Number: Treating RN: Montey Hora [birth year] (70 y.o. Other Clinician: Date of Birth/Sex: Female) Treating [name] Primary Care Aadith Raudenbush: [name] Zeth Buday/Extender: G Referring Johnpaul Gillentine: [name] in Treatment: 10 Active Inactive ` Medication Nursing Diagnoses: Knowledge deficit related to medication safety: actual or potential Goals: Patient/caregiver will demonstrate understanding of new oral/IV medications prescribed at the [facility] (topical prescriptions are covered under the skin breakdown problem) Date Initiated: 06/05/2016 Target Resolution Date: 10/03/2016 Goal Status: Active Interventions: Assess patient/caregiver ability to manage medication regimen upon admission and as needed Patient/Caregiver given reconciled medication list upon admission, changes in medications and discharge from the [place] Notes: ` Nutrition Nursing Diagnoses: Potential for alteratiion in Nutrition/Potential for imbalanced nutrition Goals: Patient/caregiver verbalizes understanding of need  to maintain therapeutic glucose control per primary care physician Date Initiated: 06/05/2016 Target Resolution Date: 10/03/2016 Goal Status: Active Interventions: Provide education on elevated blood sugars and impact on wound healing Notes: ` [name] [name]. ([id number]) Orientation to the Wound Care Program Nursing Diagnoses: Knowledge deficit related to the wound healing center  program Goals: Patient/caregiver will verbalize understanding of the [place] [facility] Date Initiated: 06/05/2016 Target Resolution Date: 10/03/2016 Goal Status: Active Interventions: Provide education on orientation to the wound center Notes: ` Pressure Nursing Diagnoses: Knowledge deficit related to management of pressures ulcers Goals: Patient will remain free of pressure ulcers Date Initiated: 06/05/2016 Target Resolution Date: 10/03/2016 Goal Status: Active Interventions: Assess: immobility, friction, shearing, incontinence upon admission and as needed Treatment Activities: Patient referred for seating evaluation to ensure proper offloading : 06/05/2016 Notes: ` Wound/Skin Impairment Nursing Diagnoses: Impaired tissue integrity Goals: Ulcer/skin breakdown will heal within 14 weeks Date Initiated: 06/05/2016 Target Resolution Date: 10/03/2016 Goal Status: Active Interventions: Assess patient/caregiver ability to obtain necessary supplies [name], [place]. ([id number]) Notes: Electronic Signature(s) Signed: 08/20/2016 4:56:13 PM By: Montey Hora Entered By: Montey Hora on 08/20/2016 14:07:39 [name] [name] ([id number]) -------------------------------------------------------------------------------- Pain Assessment Details Patient Name: Thorpe, Shontelle [name]. Date of Service: 08/20/2016 1:30 PM Medical Record Patient Account Number: [id number] Number: Treating RN: Montey Hora 06/23/1946 (70 y.o. Other Clinician: Date of Birth/Sex: Female) Treating [name] Primary Care Ivana Nicastro: [name] Shatana Saxton/Extender: G Referring Wanya Bangura: [name] in Treatment: 10 Active Problems Location of Pain Severity and Description of Pain Patient Has Paino No Site Locations Pain Management and Medication Current Pain Management: Notes Topical or injectable lidocaine is offered to patient for acute pain when surgical debridement is  performed. If needed, Patient is instructed to use over the counter pain medication for the following 24-48 hours after debridement. Wound care MDs do not prescribed pain medications. Patient has chronic pain or uncontrolled pain. Patient has been instructed to make an appointment with their Primary Care Physician for pain management. Electronic Signature(s) Signed: 08/20/2016 4:56:13 PM By: Montey Hora Entered By: Montey Hora on 08/20/2016 13:44:22 [place], [place] ([id number]) -------------------------------------------------------------------------------- Patient/Caregiver Education Details Patient Name: Badour, Ramie [name]. Date of Service: 08/20/2016 1:30 PM Medical Record Patient Account Number: [id number] Number: Treating RN: Montey Hora [birth year] (70 y.o. Other Clinician: Date of Birth/Gender: Female) Treating [name] Primary Care Physician/Extender: Ashok Croon Physician: Weeks in Treatment: 10 Referring Physician: Lanier [name] Education Assessment Education Provided To: Patient Education Topics Provided Wound/Skin Impairment: Handouts: Other: wound care as ordered Methods: Demonstration, Explain/Verbal Responses: State content correctly Electronic Signature(s) Signed: 08/20/2016 4:56:13 PM By: Montey Hora Entered By: Montey Hora on 08/20/2016 14:12:53 [name] [name]. ([id number]) -------------------------------------------------------------------------------- Wound Assessment Details Patient Name: [name] [name]. Date of Service: 08/20/2016 1:30 PM Medical Record Patient Account Number: [id number] Number: Treating RN: Montey Hora [birth year] (70 y.o. Other Clinician: Date of Birth/Sex: Female) Treating [name] Primary Care Bryan Omura: [name] Victoria Henshaw/Extender: G Referring Keiara Sneeringer: [name] in Treatment: 10 Wound Status Wound Number: 2 Primary Etiology: Diabetic Wound/Ulcer of the  Lower Extremity Wound Location: Left Foot - Plantar Secondary Pressure Ulcer Wounding Event: Gradually Appeared Etiology: Date Acquired: 03/25/2016 Wound Status: Open Weeks Of Treatment: 10 Comorbid Hypertension, Type II Diabetes Clustered Wound: No History: Photos Wound Measurements Length: (cm) 0.5 % Reduction in Area: Width: (cm) 0.1 % Reduction in Volum Depth: (cm) 0.1 Epithelialization: Area: (cm) 0.039 Tunneling: Volume: (cm) 0.004 Undermining: 92.9% e: 98.5% Small (1-33%) No No  Wound Description Classification: Grade 2 Wound Margin: Flat and Intact Exudate Amount: Large Exudate Type: Serous Exudate Color: amber Wound Bed Granulation Amount: Large (67-100%) Exposed Structure Granulation Quality: Red Fascia Exposed: No Necrotic Amount: None Present (0%) Fat Layer (Subcutaneous Tissue) Exposed: No Glander, Kaula [name]. ([id number]) Tendon Exposed: No Muscle Exposed: No Joint Exposed: No Bone Exposed: No Limited to Skin Breakdown Periwound Skin Texture Texture Color No Abnormalities Noted: No No Abnormalities Noted: No Callus: Yes Atrophie Blanche: No Crepitus: No Cyanosis: No Excoriation: No Ecchymosis: No Induration: No Erythema: No Rash: No Hemosiderin Staining: No Scarring: No Mottled: No Pallor: No Moisture Rubor: No No Abnormalities Noted: No Dry / Scaly: No Maceration: No Wound Preparation Ulcer Cleansing: Rinsed/Irrigated with Saline Topical Anesthetic Applied: Other: lidocaine 4%, Treatment Notes Wound #2 (Left, Plantar Foot) 1. Cleansed with: Clean wound with Normal Saline 2. Anesthetic Topical Lidocaine 4% cream to wound bed prior to debridement 4. Dressing Applied: Aquacel Ag 5. Secondary Dressing Applied Gauze and Kerlix/Conform 6. Footwear/Offloading device applied Felt/Foam 7. Secured with Recruitment consultant) Signed: 08/20/2016 4:56:13 PM By: Montey Hora Entered By: Montey Hora on 08/20/2016  14:06:05 [name] [name]. ([id number]) -------------------------------------------------------------------------------- Wound Assessment Details Patient Name: Birnie, Marianny [name]. Date of Service: 08/20/2016 1:30 PM Medical Record Patient Account Number: [id number] Number: Treating RN: Montey Hora Mar 23, 1946 (70 y.o. Other Clinician: Date of Birth/Sex: Female) Treating [name] Primary Care Yandriel Boening: [name] Eder Macek/Extender: G Referring [place]: [name] in Treatment: 10 Wound Status Wound Number: 3 Primary Etiology: Pressure Ulcer Wound Location: Left Foot - Lateral Secondary Diabetic Wound/Ulcer of the Lower Etiology: Extremity Wounding Event: Blister Wound Status: Open Date Acquired: 06/03/2016 Comorbid Hypertension, Type II Diabetes Weeks Of Treatment: 10 History: Clustered Wound: No Photos Wound Measurements Length: (cm) 0.1 Width: (cm) 0.4 Depth: (cm) 0.1 Area: (cm) 0.031 Volume: (cm) 0.003 % Reduction in Area: 95.6% % Reduction in Volume: 95.8% Epithelialization: Large (67-100%) Tunneling: No Undermining: No Wound Description Classification: Category/Stage II Foul Odor A Diabetic Severity (Wagner): Grade 1 Wound Margin: Flat and Intact Exudate Amount: Large Exudate Type: Serous Exudate Color: amber fter Cleansing: No Wound Bed Granulation Amount: Large (67-100%) Exposed Structure Granulation Quality: Pink Fascia Exposed: No [place]anton, Anadalay [name]. ([id number]) Necrotic Amount: None Present (0%) Fat Layer (Subcutaneous Tissue) Exposed: No Tendon Exposed: No Muscle Exposed: No Joint Exposed: No Bone Exposed: No Limited to Skin Breakdown Periwound Skin Texture Texture Color No Abnormalities Noted: No No Abnormalities Noted: No Callus: No Atrophie Blanche: No Crepitus: No Cyanosis: No Excoriation: No Ecchymosis: No Induration: No Erythema: No Rash: No Hemosiderin Staining: No Scarring: No Mottled: No Pallor:  No Moisture Rubor: No No Abnormalities Noted: No Dry / Scaly: No Temperature / Pain Maceration: No Temperature: No Abnormality Wound Preparation Ulcer Cleansing: Rinsed/Irrigated with Saline Topical Anesthetic Applied: None Treatment Notes Wound #3 (Left, Lateral Foot) 1. Cleansed with: Clean wound with Normal Saline 2. Anesthetic Topical Lidocaine 4% cream to wound bed prior to debridement 4. Dressing Applied: Aquacel Ag 5. Secondary Dressing Applied Gauze and Kerlix/Conform 6. Footwear/Offloading device applied Felt/Foam 7. Secured with Recruitment consultant) Signed: 08/20/2016 4:56:13 PM By: Montey Hora Entered By: Montey Hora on 08/20/2016 14:07:00 Leonhart, Mariko [name]. ([id number]) -------------------------------------------------------------------------------- Vitals Details Patient Name: Domke, Mercedes [name]. Date of Service: 08/20/2016 1:30 PM Medical Record Patient Account Number: [id number] Number: Treating RN: Montey Hora 1946-06-05 (70 y.o. Other Clinician: Date of Birth/Sex: Female) Treating [name] Primary Care Emilie Carp: [name] Pluma Diniz/Extender: G Referring Irven Ingalsbe: [name] in  Treatment: 10 Vital Signs Time Taken: 13:45 Temperature (F): 98.4 Height (in): 62 Pulse (bpm): 73 Weight (lbs): 140 Respiratory Rate (breaths/min): 18 Body Mass Index (BMI): 25.6 Blood Pressure (mmHg): 178/71 Reference Range: 80 - 120 mg / dl Electronic Signature(s) Signed: 08/20/2016 4:56:13 PM By: Montey Hora Entered By: Montey Hora on 08/20/2016 13:46:47

## 2016-08-21 NOTE — Progress Notes (Signed)
SHAYANN, GARBUTT ([id number]) Visit Report for 08/20/2016 Chief Complaint Document Details Patient Name: [name], [name] Date of Service: 08/20/2016 1:30 PM Medical Record Patient Account Number: [id number] Number: Treating RN: Montey Hora 1945-10-10 (70 y.o. Other Clinician: Date of Birth/Sex: Female) Treating ROBSON, MICHAEL Primary Care Provider: Lanier Prude Provider/Extender: G Referring Provider: Brendia Sacks in Treatment: 10 Information Obtained from: Patient Chief Complaint 06/05/16; patient is here for review of a wound on her right foot which is been present for at least a month Electronic Signature(s) Signed: 08/21/2016 7:47:19 AM By: Linton Ham MD Entered By: Linton Ham on 08/20/2016 14:37:30 [place], [place] ([id number]) -------------------------------------------------------------------------------- Debridement Details Patient Name: [name], [name] Date of Service: 08/20/2016 1:30 PM Medical Record Patient Account Number: [id number] Number: Treating RN: Montey Hora 12/22/1945 (70 y.o. Other Clinician: Date of Birth/Sex: Female) Treating ROBSON, MICHAEL Primary Care Provider: Lanier Prude Provider/Extender: G Referring Provider: Brendia Sacks in Treatment: 10 Debridement Performed for Wound #2 Left,Plantar Foot Assessment: Performed By: Physician Ricard Dillon, MD Debridement: Debridement Pre-procedure Yes - 14:05 Verification/Time Out Taken: Start Time: 14:05 Pain Control: Lidocaine 4% Topical Solution Level: Skin/Subcutaneous Tissue Total Area Debrided (L x 0.5 (cm) x 0.1 (cm) = 0.05 (cm) W): Tissue and other Viable, Non-Viable, Callus, Fibrin/Slough, Subcutaneous material debrided: Instrument: Curette Bleeding: Minimum Hemostasis Achieved: Pressure End Time: 14:07 Procedural Pain: 0 Post Procedural Pain: 0 Response to Treatment: Procedure was tolerated well Post Debridement Measurements of Total  Wound Length: (cm) 0.5 Width: (cm) 0.1 Depth: (cm) 0.1 Volume: (cm) 0.004 Character of Wound/Ulcer Post Improved Debridement: Severity of Tissue Post Debridement: Fat layer exposed Post Procedure Diagnosis Same as Pre-procedure Electronic Signature(s) Signed: 08/20/2016 4:56:13 PM By: Montey Hora Signed: 08/21/2016 7:47:19 AM By: Linton Ham MD Lira, [name] ([id number]) Entered By: Linton Ham on 08/20/2016 14:36:56 [place], [place] ([id number]) -------------------------------------------------------------------------------- Debridement Details Patient Name: [name], [name] Date of Service: 08/20/2016 1:30 PM Medical Record Patient Account Number: [id number] Number: Treating RN: Montey Hora Nov 29, 1945 (70 y.o. Other Clinician: Date of Birth/Sex: Female) Treating ROBSON, MICHAEL Primary Care Provider: Lanier Prude Provider/Extender: G Referring Provider: Brendia Sacks in Treatment: 10 Debridement Performed for Wound #3 Left,Lateral Foot Assessment: Performed By: Physician Ricard Dillon, MD Debridement: Debridement Pre-procedure Yes - 14:07 Verification/Time Out Taken: Start Time: 14:07 Pain Control: Lidocaine 4% Topical Solution Level: Skin/Subcutaneous Tissue Total Area Debrided (L x 0.1 (cm) x 0.4 (cm) = 0.04 (cm) W): Tissue and other Viable, Non-Viable, Callus, Fibrin/Slough, Subcutaneous material debrided: Instrument: Curette Bleeding: Moderate Hemostasis Achieved: Silver Nitrate End Time: 14:10 Procedural Pain: 0 Post Procedural Pain: 0 Response to Treatment: Procedure was tolerated well Post Debridement Measurements of Total Wound Length: (cm) 0.4 Stage: Category/Stage II Width: (cm) 0.1 Depth: (cm) 0.2 Volume: (cm) 0.006 Character of Wound/Ulcer Post Improved Debridement: Severity of Tissue Post Fat layer exposed Debridement: Post Procedure Diagnosis Same as Pre-procedure Electronic Signature(s) Signed:  08/20/2016 4:56:13 PM By: Elvina Mattes, [name] ([id number]) Signed: 08/21/2016 7:47:19 AM By: Linton Ham MD Entered By: Linton Ham on 08/20/2016 14:37:10 [place], [place] ([id number]) -------------------------------------------------------------------------------- HPI Details Patient Name: [name], [name] Date of Service: 08/20/2016 1:30 PM Medical Record Patient Account Number: [id number] Number: Treating RN: Montey Hora 1946-02-13 (70 y.o. Other Clinician: Date of Birth/Sex: Female) Treating ROBSON, MICHAEL Primary Care Provider: Lanier Prude Provider/Extender: G Referring Provider: Brendia Sacks in Treatment: 10 History of Present Illness HPI Description: The patient is here for followup. Denies fevers, has been  compliant with wearing Darco shoes. She states that her blood sugars are still in the low 200s. [name] is a 53F with a h/o DM who presents with an open ulcer on the plantar aspect of her L great toe. She has had it for close to 8 months. She has also had several debridements as well as courses of oral antibiotics. Currently, she denies fevers and has no pain. She has never had ulcers like this in the past. Her blood sugars are normally in the 170s. Foot xray was negative for osteomyelitis. HgbA1c was 10.3. READMISSION; 06/05/16; Mrs. [name] is a 71 year old type II diabetic by description not well-controlled. She does not have known PAD or claudication. She has apparently some degree of neuropathy or she's been told that in the past. She has a history of a wound on the plantar left foot foot for which she was seen here in 2015 although I was not involved in her care nor were any other current physicians working in our clinics. She was managed with standard dressings and a Darco forefoot offloading boot and she seems to have healed quite quickly. Apparently the patient noticed a callus/wound development a month ago although her daughter  who is present states it was longer than that. Indeed the patient seems to been followed for at least 3 clinic visits with her podiatrist Dr. Gardiner Barefoot. On October 19 noted a necrotic ulcer on the plantar aspect of the left first metatarsal head. She was using Silvadene based dressings given Keflex on 10/9. I don't believe any cultures or x-rays were done. More importantly she doesn't seem to be adequately offloading this area in her footwear ABIs in this clinic were 0.88 bilaterally. 06/12/16; continued follow-up for a central wound over the left first metatarsal head. Once again today required an extensive debridement. She is using a Darco forefoot offloading boot. Her x-ray of the foot was negative for any bone changes. She has not yet had vascular studies. ABI in this clinic was 0.88 06/19/16; formal arterial studies for early January for her daughter. In general the major wound over the plantar left first metatarsal head looks better. Area on her lateral fifth metatarsal head looks like it's proceeding towards closure. She is wearing the Darco forefoot offloading sandal was some difficulty 06/26/16; formal arterial studies towboat for early January. The area on the plantar left first metatarsal head apparently measures 0.2 x 0.2 mm smaller. She has the same surface slough and some nonviable surrounding tissue which is debrided with a curet. This cleans up quite nicely. The area on her lateral fifth metatarsal head is still fully epithelialized [place], [place] ([id number]) 07/03/16; formal arterial studies booked for early January. The areas on the plantar left first metatarsal head smaller by about 0.1 mmo. His is not making quite is much progress as I was like. She is using a Darco forefoot offload her 07/10/16; she had her arterial studies today by Dr. [name]. I do not have the formal report however patient's daughter states she has "small vessel disease" presumably this means that  there is not a revascularizable element. 07/12/2016 -- the arterial studies were done in the lab of Dr. Fletcher Anon, and he will be seeing the patient on 07/19/2016. the duplex examination showed diffuse plaque bilaterally with a 50-74% stenosis of the right SFA. Three- vessel runoff on the right two-vessel runoff on the left. The ABI on the right was 0.89 on the left was 0.95 and the TBI on the right was  0.52 and on the left was 0.36 07/17/16; her arterial studies are noted. She had a 50-74% stenosis of the right SFA three-vessel runoff on the right two-vessel runoff on the left. Her wound is on the left first metatarsal head. She had an appointment with Dr. Fletcher Anon however that had to reschedule list. The patient is traveling over the weekend and therefore she requested not to have the total contact cast replaced. 08/12/2016 -- the patient has not been back to see [place] for the last 25 days and comes in with an abscess on the plantar aspect of her left foot and the area on the left lateral foot is also an open wound now. 08/20/16; the patient arrived last week with an abscess on the right first plantar head. This was fully excised by Dr. Con Memos. CNS done showed Staphylococcus Lugdunensis. This was not specifically plated against Augmentin although I think it probably should've worked. Her foot is a lot better. As I understand things this is a coag-negative Staphylococcus species however unlike other coag-negative staph this should be treated. The patient appears to be a lot better Electronic Signature(s) Signed: 08/21/2016 7:47:19 AM By: Linton Ham MD Entered By: Linton Ham on 08/20/2016 14:40:05 [place], Janaki L. ([id number]) -------------------------------------------------------------------------------- Physical Exam Details Patient Name: Asaro, Alizae L. Date of Service: 08/20/2016 1:30 PM Medical Record Patient Account Number: [id number] Number: Treating RN: Montey Hora 17-Jul-1945 (70 y.o. Other Clinician: Date of Birth/Sex: Female) Treating ROBSON, MICHAEL Primary Care Provider: Lanier Prude Provider/Extender: G Referring Provider: Brendia Sacks in Treatment: 10 Constitutional Patient is hypertensive.. Pulse regular and within target range for patient.Marland Kitchen Respirations regular, non-labored and within target range.. Temperature is normal and within the target range for the patient.. Patient's appearance is neat and clean. Appears in no acute distress. Well nourished and well developed.. Notes Wound exam; the left lateral foot is an open area of the required sharp debridement of skin and subcutaneous tissue around the wound and necrotic material. Hemostasis with silver nitrate. The abscess which was over her first metatarsal head actually looks considerably better by picture. Small open area remains Electronic Signature(s) Signed: 08/21/2016 7:47:19 AM By: Linton Ham MD Entered By: Linton Ham on 08/20/2016 14:43:38 [place], [name] ([id number]) -------------------------------------------------------------------------------- Physician Orders Details Patient Name: Jett, Lashannon L. Date of Service: 08/20/2016 1:30 PM Medical Record Patient Account Number: [id number] Number: Treating RN: Montey Hora 08/16/1945 (70 y.o. Other Clinician: Date of Birth/Sex: Female) Treating ROBSON, MICHAEL Primary Care Provider: Lanier Prude Provider/Extender: G Referring Provider: Brendia Sacks in Treatment: 10 Verbal / Phone Orders: No Diagnosis Coding Wound Cleansing Wound #2 Left,Plantar Foot o Clean wound with Normal Saline. Wound #3 Left,Lateral Foot o Clean wound with Normal Saline. Anesthetic Wound #2 Left,Plantar Foot o Topical Lidocaine 4% cream applied to wound bed prior to debridement Wound #3 Left,Lateral Foot o Topical Lidocaine 4% cream applied to wound bed prior to debridement Primary Wound  Dressing Wound #2 Left,Plantar Foot o Aquacel Ag Wound #3 Left,Lateral Foot o Aquacel Ag Secondary Dressing Wound #2 Left,Plantar Foot o ABD and Kerlix/Conform - secure lightly with coban o Other - felt for offloading Wound #3 Left,Lateral Foot o ABD and Kerlix/Conform - secure lightly with coban o Other - felt for offloading Dressing Change Frequency Wound #2 Left,Plantar Foot o Change dressing every day. [place], [place] ([id number]) Wound #3 Left,Lateral Foot o Change dressing every day. Follow-up Appointments Wound #2 [place] o Return Appointment in 1 week. Wound #3 Left,Lateral Foot o  Return Appointment in 1 week. Off-Loading Wound #2 Left,Plantar Foot o Other: - front-offloader Wound #3 Left,Lateral Foot o Other: - front-offloader Additional Orders / Instructions Wound #2 Left,Plantar Foot o Increase protein intake. Wound #3 Left,Lateral Foot o Increase protein intake. Electronic Signature(s) Signed: 08/20/2016 4:56:13 PM By: Montey Hora Signed: 08/21/2016 7:47:19 AM By: Linton Ham MD Entered By: Montey Hora on 08/20/2016 14:11:50 Fellers, Collyn L. ([id number]) -------------------------------------------------------------------------------- Problem List Details Patient Name: Poznanski, Helana L. Date of Service: 08/20/2016 1:30 PM Medical Record Patient Account Number: [id number] Number: Treating RN: Montey Hora Jan 30, 1946 (70 y.o. 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 Date of Service: 08/20/2016 1:30 PM Medical Record Patient Account Number: [id number] Number: Treating RN: Montey Hora [birth year] (70 y.o. Other Clinician: Date of Birth/Sex: Female) Treating ROBSON, MICHAEL Primary Care Provider: Lanier Prude Provider/Extender: G Referring Provider: Brendia Sacks in Treatment: 10 Subjective Chief Complaint Information obtained from Patient 06/05/16; patient is here for review of a wound on her right foot which is been present for at least a month History of Present Illness (HPI) The patient is here for followup. Denies fevers, has been compliant with wearing Darco shoes. She states that her blood sugars are still in the low 200s. Aseneth is a 73F with a h/o DM who presents with an open ulcer on the plantar aspect of her L great toe. She has had it for close to 8 months. She has also had several debridements as well as courses of oral antibiotics. Currently, she denies fevers and has no pain. She has never had ulcers like this in the past. Her blood sugars are normally in the 170s. Foot xray was negative for osteomyelitis. HgbA1c was 10.3. READMISSION; 06/05/16; Mrs. Downs is a 71 year old type II diabetic by description not well-controlled. She does not have known PAD or claudication. She has apparently some degree of neuropathy or she's been told that in the past. She has a history of a wound on the plantar left foot foot for which she was seen here in 2015 although I was not involved in her care nor were any other current physicians working in our clinics. She was managed with standard dressings and a  Darco forefoot offloading boot and she seems to have healed quite quickly. Apparently the patient noticed a callus/wound development a month ago although her daughter who is present states it was longer than that. Indeed the patient seems to been followed for at least 3 clinic visits with her podiatrist Dr. Gardiner Barefoot. On October 19 noted a necrotic ulcer on the plantar aspect of the left first metatarsal head. She was using Silvadene based dressings given Keflex on 10/9. I don't believe any cultures or x-rays were done. More importantly she doesn't seem to be adequately offloading this area in her footwear ABIs in this clinic were 0.88 bilaterally. 06/12/16; continued follow-up for a central wound over the left first metatarsal head. Once again today required  an extensive debridement. She is using a Darco forefoot offloading boot. Her x-ray of the foot was negative for any bone changes. She has not yet had vascular studies. ABI in this clinic was 0.88 06/19/16; formal arterial studies for early January for her daughter. In general the major wound over the [place], [place] ([id number]) plantar left first metatarsal head looks better. Area on her lateral fifth metatarsal head looks like it's proceeding towards closure. She is wearing the Darco forefoot offloading sandal was some difficulty 06/26/16; formal arterial studies towboat for early January. The area on the plantar left first metatarsal head apparently measures 0.2 x 0.2 mm smaller. She has the same surface slough and some nonviable surrounding tissue which is debrided with a curet. This cleans up quite nicely. The area on her lateral fifth metatarsal head is still fully epithelialized 07/03/16; formal arterial studies booked for early January. The areas on the plantar left first metatarsal head smaller by about 0.1 mm. His is not making quite is much progress as I was like. She is using a Darco forefoot offload her 07/10/16; she had  her arterial studies today by Dr. [name]. I do not have the formal report however patient's daughter states she has "small vessel disease" presumably this means that there is not a revascularizable element. 07/12/2016 -- the arterial studies were done in the lab of Dr. Fletcher Anon, and he will be seeing the patient on 07/19/2016. the duplex examination showed diffuse plaque bilaterally with a 50-74% stenosis of the right SFA. Three- vessel runoff on the right two-vessel runoff on the left. The ABI on the right was 0.89 on the left was 0.95 and the TBI on the right was 0.52 and on the left was 0.36 07/17/16; her arterial studies are noted. She had a 50-74% stenosis of the right SFA three-vessel runoff on the right two-vessel runoff on the left. Her wound is on the left first metatarsal head. She had an appointment with Dr. Fletcher Anon however that had to reschedule list. The patient is traveling over the weekend and therefore she requested not to have the total contact cast replaced. 08/12/2016 -- the patient has not been back to see [place] for the last 25 days and comes in with an abscess on the plantar aspect of her left foot and the area on the left lateral foot is also an open wound now. 08/20/16; the patient arrived last week with an abscess on the right first plantar head. This was fully excised by Dr. Con Memos. CNS done showed Staphylococcus Lugdunensis. This was not specifically plated against Augmentin although I think it probably should've worked. Her foot is a lot better. As I understand things this is a coag-negative Staphylococcus species however unlike other coag-negative staph this should be treated. The patient appears to be a lot better Objective Constitutional Patient is hypertensive.. Pulse regular and within target range for patient.Marland Kitchen Respirations regular, non-labored and within target range.. Temperature is normal and within the target range for the patient.. Patient's appearance is neat and  clean. Appears in no acute distress. Well nourished and well developed.. Vitals Time Taken: 1:45 PM, Height: 62 in, Weight: 140 lbs, BMI: 25.6, Temperature: 98.4 F, Pulse: 73 bpm, Respiratory Rate: 18 breaths/min, Blood Pressure: 178/71 mmHg. General Notes: Wound exam; the left lateral foot is an open area of the required sharp debridement of skin and subcutaneous tissue around the wound and necrotic material. Hemostasis with silver nitrate. The [place], [place] ([id number]) abscess which was over her first  metatarsal head actually looks considerably better by picture. Small open area remains Integumentary (Hair, Skin) Wound #2 status is Open. Original cause of wound was Gradually Appeared. The wound is located on the [place]. The wound measures 0.5cm length x 0.1cm width x 0.1cm depth; 0.039cm^2 area and 0.004cm^3 volume. The wound is limited to skin breakdown. There is no tunneling or undermining noted. There is a large amount of serous drainage noted. The wound margin is flat and intact. There is large (67- 100%) red granulation within the wound bed. There is no necrotic tissue within the wound bed. The periwound skin appearance exhibited: Callus. The periwound skin appearance did not exhibit: Crepitus, Excoriation, Induration, Rash, Scarring, Dry/Scaly, Maceration, Atrophie Blanche, Cyanosis, Ecchymosis, Hemosiderin Staining, Mottled, Pallor, Rubor, Erythema. Wound #3 status is Open. Original cause of wound was Blister. The wound is located on the Left,Lateral Foot. The wound measures 0.1cm length x 0.4cm width x 0.1cm depth; 0.031cm^2 area and 0.003cm^3 volume. The wound is limited to skin breakdown. There is no tunneling or undermining noted. There is a large amount of serous drainage noted. The wound margin is flat and intact. There is large (67-100%) pink granulation within the wound bed. There is no necrotic tissue within the wound bed. The periwound skin appearance did not  exhibit: Callus, Crepitus, Excoriation, Induration, Rash, Scarring, Dry/Scaly, Maceration, Atrophie Blanche, Cyanosis, Ecchymosis, Hemosiderin Staining, Mottled, Pallor, Rubor, Erythema. Periwound temperature was noted as No Abnormality. Assessment Active Problems ICD-10 E11.621 - Type 2 diabetes mellitus with foot ulcer L97.522 - Non-pressure chronic ulcer of other part of left foot with fat layer exposed L02.612 - Cutaneous abscess of left foot Procedures Wound #2 Wound #2 is a Diabetic Wound/Ulcer of the Lower Extremity located on the Left,Plantar Foot . There was a Skin/Subcutaneous Tissue Debridement ([id number]) debridement with total area of 0.05 sq cm performed by Ricard Dillon, MD. with the following instrument(s): Curette to remove Viable and Non-Viable tissue/material including Fibrin/Slough, Callus, and Subcutaneous after achieving pain control using Lidocaine 4% Topical Solution. A time out was conducted at 14:05, prior to the start of the procedure. A Minimum amount of bleeding was controlled with Pressure. The procedure was tolerated well with a pain level of 0 throughout and a pain level of 0 following the procedure. Post Debridement Measurements: 0.5cm length x 0.1cm width x 0.1cm depth; 0.004cm^3 volume. [place], [place] ([id number]) Character of Wound/Ulcer Post Debridement is improved. Severity of Tissue Post Debridement is: Fat layer exposed. Post procedure Diagnosis Wound #2: Same as Pre-Procedure Wound #3 Wound #3 is a Pressure Ulcer located on the Left,Lateral Foot . There was a Skin/Subcutaneous Tissue Debridement ([id number]) debridement with total area of 0.04 sq cm performed by Ricard Dillon, MD. with the following instrument(s): Curette to remove Viable and Non-Viable tissue/material including Fibrin/Slough, Callus, and Subcutaneous after achieving pain control using Lidocaine 4% Topical Solution. A time out was conducted at 14:07, prior to the start  of the procedure. A Moderate amount of bleeding was controlled with Silver Nitrate. The procedure was tolerated well with a pain level of 0 throughout and a pain level of 0 following the procedure. Post Debridement Measurements: 0.4cm length x 0.1cm width x 0.2cm depth; 0.006cm^3 volume. Post debridement Stage noted as Category/Stage II. Character of Wound/Ulcer Post Debridement is improved. Severity of Tissue Post Debridement is: Fat layer exposed. Post procedure Diagnosis Wound #3: Same as Pre-Procedure Plan Wound Cleansing: Wound #2 Left,Plantar Foot: Clean wound with Normal Saline. Wound #3 Left,Lateral Foot: Clean  wound with Normal Saline. Anesthetic: Wound #2 Left,Plantar Foot: Topical Lidocaine 4% cream applied to wound bed prior to debridement Wound #3 Left,Lateral Foot: Topical Lidocaine 4% cream applied to wound bed prior to debridement Primary Wound Dressing: Wound #2 Left,Plantar Foot: Aquacel Ag Wound #3 Left,Lateral Foot: Aquacel Ag Secondary Dressing: Wound #2 Left,Plantar Foot: ABD and Kerlix/Conform - secure lightly with coban Other - felt for offloading Wound #3 Left,Lateral Foot: ABD and Kerlix/Conform - secure lightly with coban Other - felt for offloading Dressing Change Frequency: Wound #2 Left,Plantar Foot: Change dressing every day. [place], [place] ([id number]) Wound #3 Left,Lateral Foot: Change dressing every day. Follow-up Appointments: Wound #2 Left,Plantar Foot: Return Appointment in 1 week. Wound #3 Left,Lateral Foot: Return Appointment in 1 week. Off-Loading: Wound #2 Left,Plantar Foot: Other: - front-offloader Wound #3 Left,Lateral Foot: Other: - front-offloader Additional Orders / Instructions: Wound #2 Left,Plantar Foot: Increase protein intake. Wound #3 Left,Lateral Foot: Increase protein intake. 1 Contintue Aquacel AG to both wound areas 2 for sake of completeness dicloxacillin 500 qid for 1 wk Staphylococcus  [place] Electronic Signature(s) Signed: 08/21/2016 7:47:19 AM By: Linton Ham MD Entered By: Linton Ham on 08/20/2016 14:45:36 [place], [place] ([id number]) -------------------------------------------------------------------------------- SuperBill Details Patient Name: Kott, Messiah L. Date of Service: 08/20/2016 Medical Record Patient Account Number: [id number] Number: Treating RN: Montey Hora 1946-03-31 (70 y.o. Other Clinician: Date of Birth/Sex: Female) Treating ROBSON, MICHAEL Primary Care Provider: Lanier Prude Provider/Extender: G Referring Provider: Lanier Prude Service Line: Outpatient Weeks in Treatment: 10 Diagnosis Coding ICD-10 Codes Code Description E11.621 Type 2 diabetes mellitus with foot ulcer L97.522 Non-pressure chronic ulcer of other part of left foot with fat layer exposed L02.612 Cutaneous abscess of left foot Facility Procedures CPT4 Code Description: [id number] - DEB SUBQ TISSUE 20 SQ CM/< ICD-10 Description Diagnosis E11.621 Type 2 diabetes mellitus with foot ulcer L97.522 Non-pressure chronic ulcer of other part of left foot Modifier: with fat lay Quantity: 1 er exposed Physician Procedures CPT4 Code Description: [id number] - WC PHYS SUBQ TISS 20 SQ CM ICD-10 Description Diagnosis E11.621 Type 2 diabetes mellitus with foot ulcer L97.522 Non-pressure chronic ulcer of other part of left foot Modifier: with fat laye Quantity: 1 r exposed Electronic Signature(s) Signed: 08/21/2016 7:47:19 AM By: Linton Ham MD Entered By: Linton Ham on 08/20/2016 14:46:06

## 2016-08-22 NOTE — Addendum Note (Signed)
Addended by: Jerl Mina on: 08/22/2016 02:51 PM   Modules accepted: Orders

## 2016-08-23 ENCOUNTER — Encounter: Payer: Medicare Other | Admitting: Physical Therapy

## 2016-08-28 ENCOUNTER — Encounter: Payer: Medicare Other | Admitting: Internal Medicine

## 2016-08-28 DIAGNOSIS — E11621 Type 2 diabetes mellitus with foot ulcer: Secondary | ICD-10-CM | POA: Diagnosis not present

## 2016-08-29 NOTE — Progress Notes (Signed)
[name], [name] ([id number]) Visit Report for 08/28/2016 Chief Complaint Document Details Patient Name: [name], [name] Date of Service: 08/28/2016 2:30 PM Medical Record Patient Account Number: [id number] Number: Treating RN: Montey Hora 1945/09/29 (70 y.o. Other Clinician: Date of Birth/Sex: Female) Treating ROBSON, MICHAEL Primary Care Provider: Lanier Prude Provider/Extender: G Referring Provider: Brendia Sacks in Treatment: 12 Information Obtained from: Patient Chief Complaint 06/05/16; patient is here for review of a wound on her right foot which is been present for at least a month Electronic Signature(s) Signed: 08/28/2016 4:59:22 PM By: Linton Ham MD Entered By: Linton Ham on 08/28/2016 15:30:32 [place], [place]. ([id number]) -------------------------------------------------------------------------------- Debridement Details Patient Name: Chittenden, [name] Date of Service: 08/28/2016 2:30 PM Medical Record Patient Account Number: [id number] Number: Treating RN: Montey Hora 01-23-1946 (70 y.o. Other Clinician: Date of Birth/Sex: Female) Treating ROBSON, MICHAEL Primary Care Provider: Lanier Prude Provider/Extender: G Referring Provider: Brendia Sacks in Treatment: 12 Debridement Performed for Wound #3 Left,Lateral Foot Assessment: Performed By: Physician Ricard Dillon, MD Debridement: Debridement Pre-procedure Yes - 14:45 Verification/Time Out Taken: Start Time: 14:45 Pain Control: Lidocaine 4% Topical Solution Level: Skin/Subcutaneous Tissue Total Area Debrided (L x 0.2 (cm) x 0.4 (cm) = 0.08 (cm) W): Tissue and other Viable, Non-Viable, Callus, Fibrin/Slough, Subcutaneous material debrided: Instrument: Curette Bleeding: Moderate Hemostasis Achieved: Silver Nitrate End Time: 14:49 Procedural Pain: 0 Post Procedural Pain: 0 Response to Treatment: Procedure was tolerated well Post Debridement Measurements of  Total Wound Length: (cm) 0.2 Stage: Category/Stage II Width: (cm) 0.4 Depth: (cm) 0.2 Volume: (cm) 0.013 Character of Wound/Ulcer Post Improved Debridement: Severity of Tissue Post Fat layer exposed Debridement: Post Procedure Diagnosis Same as Pre-procedure Electronic Signature(s) Signed: 08/28/2016 4:59:22 PM By: Linton Ham MD [name] ([id number]) Signed: 08/28/2016 5:23:30 PM By: Montey Hora Entered By: Linton Ham on 08/28/2016 15:29:14 [place], [place] ([id number]) -------------------------------------------------------------------------------- HPI Details Patient Name: [name], [name] Date of Service: 08/28/2016 2:30 PM Medical Record Patient Account Number: [id number] Number: Treating RN: Montey Hora 1945-11-29 (70 y.o. Other Clinician: Date of Birth/Sex: Female) Treating ROBSON, MICHAEL Primary Care Provider: Lanier Prude Provider/Extender: G Referring Provider: Brendia Sacks in Treatment: 12 History of Present Illness HPI Description: The patient is here for followup. Denies fevers, has been compliant with wearing Darco shoes. She states that her blood sugars are still in the low 200s. [name] is a 70F with a h/o DM who presents with an open ulcer on the plantar aspect of her L great toe. She has had it for close to 8 months. She has also had several debridements as well as courses of oral antibiotics. Currently, she denies fevers and has no pain. She has never had ulcers like this in the past. Her blood sugars are normally in the 170s. Foot xray was negative for osteomyelitis. HgbA1c was 10.3. READMISSION; 06/05/16; [name] is a 72 year old type II diabetic by description not well-controlled. She does not have known PAD or claudication. She has apparently some degree of neuropathy or she's been told that in the past. She has a history of a wound on the plantar left foot foot for which she was seen here in 2015 although I  was not involved in her care nor were any other current physicians working in our clinics. She was managed with standard dressings and a Darco forefoot offloading boot and she seems to have healed quite quickly. Apparently the patient noticed a callus/wound development a month ago although her daughter who  is present states it was longer than that. Indeed the patient seems to been followed for at least 3 clinic visits with her podiatrist Dr. Gardiner Barefoot. On October 19 noted a necrotic ulcer on the plantar aspect of the left first metatarsal head. She was using Silvadene based dressings given Keflex on 10/9. I don't believe any cultures or x-rays were done. More importantly she doesn't seem to be adequately offloading this area in her footwear ABIs in this clinic were 0.88 bilaterally. 06/12/16; continued follow-up for a central wound over the left first metatarsal head. Once again today required an extensive debridement. She is using a Darco forefoot offloading boot. Her x-ray of the foot was negative for any bone changes. She has not yet had vascular studies. ABI in this clinic was 0.88 06/19/16; formal arterial studies for early January for her daughter. In general the major wound over the plantar left first metatarsal head looks better. Area on her lateral fifth metatarsal head looks like it's proceeding towards closure. She is wearing the Darco forefoot offloading sandal was some difficulty 06/26/16; formal arterial studies towboat for early January. The area on the plantar left first metatarsal head apparently measures 0.2 x 0.2 mm smaller. She has the same surface slough and some nonviable surrounding tissue which is debrided with a curet. This cleans up quite nicely. The area on her lateral fifth metatarsal head is still fully epithelialized [place], [place] ([id number]) 07/03/16; formal arterial studies booked for early January. The areas on the plantar left first metatarsal  head smaller by about 0.1 mmo. His is not making quite is much progress as I was like. She is using a Darco forefoot offload her 07/10/16; she had her arterial studies today by Dr. [name]. I do not have the formal report however patient's daughter states she has "small vessel disease" presumably this means that there is not a revascularizable element. 07/12/2016 -- the arterial studies were done in the lab of Dr. Fletcher Anon, and he will be seeing the patient on 07/19/2016. the duplex examination showed diffuse plaque bilaterally with a 50-74% stenosis of the right SFA. Three- vessel runoff on the right two-vessel runoff on the left. The ABI on the right was 0.89 on the left was 0.95 and the TBI on the right was 0.52 and on the left was 0.36 07/17/16; her arterial studies are noted. She had a 50-74% stenosis of the right SFA three-vessel runoff on the right two-vessel runoff on the left. Her wound is on the left first metatarsal head. She had an appointment with Dr. Fletcher Anon however that had to reschedule list. The patient is traveling over the weekend and therefore she requested not to have the total contact cast replaced. 08/12/2016 -- the patient has not been back to see [place] for the last 25 days and comes in with an abscess on the plantar aspect of her left foot and the area on the left lateral foot is also an open wound now. 08/20/16; the patient arrived last week with an abscess on the left first plantar head. This was fully excised by Dr. Con Memos. CNS done showed Staphylococcus Lugdunensis. This was not specifically plated against Augmentin although I think it probably should've worked. Her foot is a lot better. As I understand things this is a coag-negative Staphylococcus species however unlike other coag-negative staph this should be treated. The patient appears to be a lot better 08/28/16; she is completed the dicloxacillin. Area on the left first plantar metatarsal head is completely resolved  and  epithelialized. The area on the lateral aspect of her left fifth metatarsal head however is still problematic Electronic Signature(s) Signed: 08/28/2016 4:59:22 PM By: Linton Ham MD Entered By: Linton Ham on 08/28/2016 15:35:02 [place], [place]. ([id number]) -------------------------------------------------------------------------------- Physical Exam Details Patient Name: [name], [name] Date of Service: 08/28/2016 2:30 PM Medical Record Patient Account Number: [id number] Number: Treating RN: Montey Hora [birth year] (70 y.o. Other Clinician: Date of Birth/Sex: Female) Treating ROBSON, MICHAEL Primary Care Provider: Lanier Prude Provider/Extender: G Referring Provider: Brendia Sacks in Treatment: 12 Constitutional Patient is hypertensive.. Pulse regular and within target range for patient.Marland Kitchen Respirations regular, non-labored and within target range.. Temperature is normal and within the target range for the patient.. Patient's appearance is neat and clean. Appears in no acute distress. Well nourished and well developed.. Notes Wound exam; area on her plantar first metatarsal head is fully epithelialized there is nothing open here. The abscess which was excised by Dr. Con Memos is resolved. There is no evidence of infection here. oHowever the area on the lateral aspect of her first metatarsal head is still open. There is not a viable surface here in spite of debridement with a #3 curet removing necrotic material. Hemostasis was silver nitrate. Electronic Signature(s) Signed: 08/28/2016 4:59:22 PM By: Linton Ham MD Entered By: Linton Ham on 08/28/2016 15:33:39 [place], [place] [name] ([id number]) -------------------------------------------------------------------------------- Physician Orders Details Patient Name: [name], [name] Date of Service: 08/28/2016 2:30 PM Medical Record Patient Account Number: [id number] Number: Treating RN: Montey Hora [birth year] (70 y.o. Other Clinician: Date of Birth/Sex: Female) Treating ROBSON, MICHAEL Primary Care Provider: Lanier Prude Provider/Extender: G Referring Provider: Brendia Sacks in Treatment: 12 Verbal / Phone Orders: No Diagnosis Coding Wound Cleansing Wound #2 Left,Plantar Foot o Clean wound with Normal Saline. Wound #3 Left,Lateral Foot o Clean wound with Normal Saline. Anesthetic Wound #2 Left,Plantar Foot o Topical Lidocaine 4% cream applied to wound bed prior to debridement Wound #3 Left,Lateral Foot o Topical Lidocaine 4% cream applied to wound bed prior to debridement Primary Wound Dressing Wound #2 Left,Plantar Foot o Prisma Ag Secondary Dressing Wound #2 Left,Plantar Foot o ABD and Kerlix/Conform - secure lightly with coban o Foam Wound #3 Left,Lateral Foot o ABD and Kerlix/Conform - secure lightly with coban o Foam Dressing Change Frequency Wound #2 Left,Plantar Foot o Change dressing every day. Wound #3 Left,Lateral Foot o Change dressing every day. [name], [name] ([id number]) Follow-up Appointments Wound #2 [place] o Return Appointment in 1 week. Wound #3 Left,Lateral Foot o Return Appointment in 1 week. Off-Loading Wound #2 Left,Plantar Foot o Other: - wide shoe Wound #3 Left,Lateral Foot o Other: - wide shoe Additional Orders / Instructions Wound #2 Left,Plantar Foot o Increase protein intake. Wound #3 Left,Lateral Foot o Increase protein intake. Electronic Signature(s) Signed: 08/28/2016 4:59:22 PM By: Linton Ham MD Signed: 08/28/2016 5:23:30 PM By: Montey Hora Entered By: Montey Hora on 08/28/2016 14:52:04 [name], [name] ([id number]) -------------------------------------------------------------------------------- Problem List Details Patient Name: [name], [name] Date of Service: 08/28/2016 2:30 PM Medical Record Patient Account Number:  [id number] Number: Treating RN: Montey Hora August 31, 1945 (70 y.o. 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 Date of Service: 08/28/2016 2:30 PM Medical Record Patient Account Number: [id number] Number: Treating RN: Montey Hora Apr 06, 1946 (70 y.o. Other Clinician: Date of Birth/Sex: Female) Treating ROBSON, MICHAEL Primary Care Provider: Lanier Prude Provider/Extender: G Referring Provider: Brendia Sacks in Treatment: 12 Subjective Chief Complaint Information obtained from Patient 06/05/16; patient is here for review of a wound on her right foot which is been present for at least a month History of Present Illness (HPI) The patient is here for followup. Denies fevers, has been compliant with wearing Darco shoes. She states that her blood sugars are still in the low 200s. Devita is a 13F with a h/o DM who presents with an open ulcer on the plantar aspect of her L great toe. She has had it for close to 8 months. She has also had several debridements as well as courses of oral antibiotics. Currently, she denies fevers and has no pain. She has never had ulcers like this in the past. Her blood sugars are  normally in the 170s. Foot xray was negative for osteomyelitis. HgbA1c was 10.3. READMISSION; 06/05/16; Mrs. Talwar is a 71 year old type II diabetic by description not well-controlled. She does not have known PAD or claudication. She has apparently some degree of neuropathy or she's been told that in the past. She has a history of a wound on the plantar left foot foot for which she was seen here in 2015 although I was not involved in her care nor were any other current physicians working in our clinics. She was managed with standard dressings and a Darco forefoot offloading boot and she seems to have healed quite quickly. Apparently the patient noticed a callus/wound development a month ago although her daughter who is present states it was longer than that. Indeed the patient seems to been followed for at least 3 clinic visits with her podiatrist Dr. Gardiner Barefoot. On October 19 noted a necrotic ulcer on the plantar aspect of the left first metatarsal head. She was using Silvadene based dressings given Keflex on 10/9. I don't believe any cultures or x-rays were done. More importantly she doesn't seem to be adequately offloading this area in her footwear ABIs in this clinic were 0.88 bilaterally. 06/12/16; continued follow-up for a central wound over the left first metatarsal head. Once again today required an extensive debridement. She is using a Darco forefoot offloading boot. Her x-ray of the foot was negative for any bone changes. She has not yet had vascular studies. ABI in this clinic was 0.88 06/19/16; formal arterial studies for early January for her daughter. In general the major wound over the [place], [place] ([id number]) plantar left first metatarsal head looks better. Area on her lateral fifth metatarsal head looks like it's proceeding towards closure. She is wearing the Darco forefoot offloading sandal was some difficulty 06/26/16; formal arterial studies towboat for early  January. The area on the plantar left first metatarsal head apparently measures 0.2 x 0.2 mm smaller. She has the same surface slough and some nonviable surrounding tissue which is debrided with a curet. This cleans up quite nicely. The area on her lateral fifth metatarsal head is still fully epithelialized 07/03/16; formal arterial studies booked for early January. The areas on the plantar left first metatarsal head smaller  by about 0.1 mm. His is not making quite is much progress as I was like. She is using a Darco forefoot offload her 07/10/16; she had her arterial studies today by Dr. [name]. I do not have the formal report however patient's daughter states she has "small vessel disease" presumably this means that there is not a revascularizable element. 07/12/2016 -- the arterial studies were done in the lab of Dr. Fletcher Anon, and he will be seeing the patient on 07/19/2016. the duplex examination showed diffuse plaque bilaterally with a 50-74% stenosis of the right SFA. Three- vessel runoff on the right two-vessel runoff on the left. The ABI on the right was 0.89 on the left was 0.95 and the TBI on the right was 0.52 and on the left was 0.36 07/17/16; her arterial studies are noted. She had a 50-74% stenosis of the right SFA three-vessel runoff on the right two-vessel runoff on the left. Her wound is on the left first metatarsal head. She had an appointment with Dr. Fletcher Anon however that had to reschedule list. The patient is traveling over the weekend and therefore she requested not to have the total contact cast replaced. 08/12/2016 -- the patient has not been back to see [place] for the last 25 days and comes in with an abscess on the plantar aspect of her left foot and the area on the left lateral foot is also an open wound now. 08/20/16; the patient arrived last week with an abscess on the left first plantar head. This was fully excised by Dr. Con Memos. CNS done showed Staphylococcus Lugdunensis.  This was not specifically plated against Augmentin although I think it probably should've worked. Her foot is a lot better. As I understand things this is a coag-negative Staphylococcus species however unlike other coag-negative staph this should be treated. The patient appears to be a lot better 08/28/16; she is completed the dicloxacillin. Area on the left first plantar metatarsal head is completely resolved and epithelialized. The area on the lateral aspect of her left fifth metatarsal head however is still problematic Objective Constitutional Patient is hypertensive.. Pulse regular and within target range for patient.Marland Kitchen Respirations regular, non-labored and within target range.. Temperature is normal and within the target range for the patient.. Patient's appearance is neat and clean. Appears in no acute distress. Well nourished and well developed.. Vitals Time Taken: 2:32 PM, Height: 62 in, Weight: 140 lbs, BMI: 25.6, Temperature: 98.1 F, Pulse: 87 bpm, Respiratory Rate: 18 breaths/min, Blood Pressure: 198/75 mmHg. Stills, [name] ([id number]) General Notes: Wound exam; area on her plantar first metatarsal head is fully epithelialized there is nothing open here. The abscess which was excised by Dr. Con Memos is resolved. There is no evidence of infection here. However the area on the lateral aspect of her first metatarsal head is still open. There is not a viable surface here in spite of debridement with a #3 curet removing necrotic material. Hemostasis was silver nitrate. Integumentary (Hair, Skin) Wound #2 status is Open. Original cause of wound was Gradually Appeared. The wound is located on the [place]. The wound measures 0.1cm length x 0.1cm width x 0.1cm depth; 0.008cm^2 area and 0.001cm^3 volume. The wound is limited to skin breakdown. There is no tunneling or undermining noted. There is a large amount of serous drainage noted. The wound margin is flat and intact. There is  large (67- 100%) red granulation within the wound bed. There is no necrotic tissue within the wound bed. The periwound skin appearance exhibited:  Callus. The periwound skin appearance did not exhibit: Crepitus, Excoriation, Induration, Rash, Scarring, Dry/Scaly, Maceration, Atrophie Blanche, Cyanosis, Ecchymosis, Hemosiderin Staining, Mottled, Pallor, Rubor, Erythema. Wound #3 status is Open. Original cause of wound was Blister. The wound is located on the Left,Lateral Foot. The wound measures 0.2cm length x 0.4cm width x 0.1cm depth; 0.063cm^2 area and 0.006cm^3 volume. The wound is limited to skin breakdown. There is no tunneling or undermining noted. There is a large amount of serous drainage noted. The wound margin is flat and intact. There is large (67-100%) pink granulation within the wound bed. There is no necrotic tissue within the wound bed. The periwound skin appearance did not exhibit: Callus, Crepitus, Excoriation, Induration, Rash, Scarring, Dry/Scaly, Maceration, Atrophie Blanche, Cyanosis, Ecchymosis, Hemosiderin Staining, Mottled, Pallor, Rubor, Erythema. Periwound temperature was noted as No Abnormality. Assessment Active Problems ICD-10 E11.621 - Type 2 diabetes mellitus with foot ulcer L97.522 - Non-pressure chronic ulcer of other part of left foot with fat layer exposed L02.612 - Cutaneous abscess of left foot Procedures Wound #3 Wound #3 is a Pressure Ulcer located on the Left,Lateral Foot . There was a Skin/Subcutaneous Tissue Debridement ([id number]) debridement with total area of 0.08 sq cm performed by Ricard Dillon, MD. with the following instrument(s): Curette to remove Viable and Non-Viable tissue/material including Fibrin/Slough, Callus, and Subcutaneous after achieving pain control using Lidocaine 4% Topical Solution. [name], [name] ([id number]) A time out was conducted at 14:45, prior to the start of the procedure. A Moderate amount of bleeding  was controlled with Silver Nitrate. The procedure was tolerated well with a pain level of 0 throughout and a pain level of 0 following the procedure. Post Debridement Measurements: 0.2cm length x 0.4cm width x 0.2cm depth; 0.013cm^3 volume. Post debridement Stage noted as Category/Stage II. Character of Wound/Ulcer Post Debridement is improved. Severity of Tissue Post Debridement is: Fat layer exposed. Post procedure Diagnosis Wound #3: Same as Pre-Procedure Plan Wound Cleansing: Wound #2 Left,Plantar Foot: Clean wound with Normal Saline. Wound #3 Left,Lateral Foot: Clean wound with Normal Saline. Anesthetic: Wound #2 Left,Plantar Foot: Topical Lidocaine 4% cream applied to wound bed prior to debridement Wound #3 Left,Lateral Foot: Topical Lidocaine 4% cream applied to wound bed prior to debridement Primary Wound Dressing: Wound #2 Left,Plantar Foot: Prisma Ag Secondary Dressing: Wound #2 Left,Plantar Foot: ABD and Kerlix/Conform - secure lightly with coban Foam Wound #3 Left,Lateral Foot: ABD and Kerlix/Conform - secure lightly with coban Foam Dressing Change Frequency: Wound #2 Left,Plantar Foot: Change dressing every day. Wound #3 Left,Lateral Foot: Change dressing every day. Follow-up Appointments: Wound #2 Left,Plantar Foot: Return Appointment in 1 week. Wound #3 Left,Lateral Foot: Return Appointment in 1 week. Off-Loading: Wound #2 Left,Plantar Foot: Other: - wide shoe Wound #3 Left,Lateral Foot: Other: - wide shoe [name], [name] ([id number]) Additional Orders / Instructions: Wound #2 Left,Plantar Foot: Increase protein intake. Wound #3 Left,Lateral Foot: Increase protein intake. continue with silver alginate to the left 5th met head. This may need to be xrayed area on the first plantar met head on the right may need to look into this area further oxray Electronic Signature(s) Signed: 08/28/2016 4:59:22 PM By: Linton Ham MD Entered By: Linton Ham  on 08/28/2016 15:38:01 [place], [place] ([id number]) -------------------------------------------------------------------------------- SuperBill Details Patient Name: [name], [name] Date of Service: 08/28/2016 Medical Record Patient Account Number: [id number] Number: Treating RN: Montey Hora Mar 23, 1946 (70 y.o. Other Clinician: Date of Birth/Sex: Female) Treating Keeshia Sanderlin Primary Care Provider: Lanier Prude Provider/Extender: G Referring Provider:  Lanier Prude Service Line: Outpatient Weeks in Treatment: 12 Diagnosis Coding ICD-10 Codes Code Description E11.621 Type 2 diabetes mellitus with foot ulcer L97.522 Non-pressure chronic ulcer of other part of left foot with fat layer exposed L02.612 Cutaneous abscess of left foot Facility Procedures CPT4 Code Description: [id number] - DEB SUBQ TISSUE 20 SQ CM/< ICD-10 Description Diagnosis E11.621 Type 2 diabetes mellitus with foot ulcer L97.522 Non-pressure chronic ulcer of other part of left foot Modifier: with fat lay Quantity: 1 er exposed Physician Procedures CPT4 Code Description: [id number] - WC PHYS SUBQ TISS 20 SQ CM ICD-10 Description Diagnosis 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

## 2016-08-29 NOTE — Progress Notes (Signed)
CARRIANN, HESSE ([id number]) Visit Report for 08/28/2016 Arrival Information Details Patient Name: [name], [name] Date of Service: 08/28/2016 2:30 PM Medical Record Patient Account Number: [id number] Number: Treating RN: Montey Hora 1946-03-15 (70 y.o. Other Clinician: Date of Birth/Sex: Female) Treating ROBSON, MICHAEL Primary Care Abigail Marsiglia: Lanier Prude Fiza Nation/Extender: G Referring [place]: Brendia Sacks in Treatment: 12 Visit Information History Since Last Visit Added or deleted any medications: No Patient Arrived: Ambulatory Any new allergies or adverse reactions: No Arrival Time: 14:31 Had a fall or experienced change in No Accompanied By: self activities of daily living that may affect Transfer Assistance: None risk of falls: Patient Identification Verified: Yes Signs or symptoms of abuse/neglect since last No Secondary Verification Process Yes visito Completed: Hospitalized since last visit: No Patient Has Alerts: Yes Has Dressing in Place as Prescribed: Yes Patient Alerts: DMII Pain Present Now: No Electronic Signature(s) Signed: 08/28/2016 5:23:30 PM By: Montey Hora Entered By: Montey Hora on 08/28/2016 14:31:54 [place], [place]. ([id number]) -------------------------------------------------------------------------------- Encounter Discharge Information Details Patient Name: [name], [name] Date of Service: 08/28/2016 2:30 PM Medical Record Patient Account Number: [id number] Number: Treating RN: Montey Hora 04-27-1946 (70 y.o. Other Clinician: Date of Birth/Sex: Female) Treating ROBSON, MICHAEL Primary Care Aquil Duhe: Lanier Prude Enedina Pair/Extender: G Referring Ariya Bohannon: Brendia Sacks in Treatment: 12 Encounter Discharge Information Items Discharge Pain Level: 0 Discharge Condition: Stable Ambulatory Status: Ambulatory Discharge Destination: Home Transportation: Private Auto Accompanied By: self Schedule  Follow-up Appointment: Yes Medication Reconciliation completed and provided to Patient/Care No Shardai Star: Provided on Clinical Summary of Care: 08/28/2016 Form Type Recipient Paper Patient BR Electronic Signature(s) Signed: 08/28/2016 4:29:46 PM By: Montey Hora Previous Signature: 08/28/2016 3:04:18 PM Version By: Ruthine Dose Entered By: Montey Hora on 08/28/2016 16:29:46 [place], [place] ([id number]) -------------------------------------------------------------------------------- Lower Extremity Assessment Details Patient Name: Bussiere, Makyra L. Date of Service: 08/28/2016 2:30 PM Medical Record Patient Account Number: [id number] Number: Treating RN: Montey Hora Feb 09, 1946 (70 y.o. Other Clinician: Date of Birth/Sex: Female) Treating ROBSON, MICHAEL Primary Care Jamian Andujo: Lanier Prude Alonna Bartling/Extender: G Referring Anikin Prosser: Brendia Sacks in Treatment: 12 Vascular Assessment Pulses: Dorsalis Pedis Palpable: [Left:Yes] Posterior Tibial Extremity colors, hair growth, and conditions: Extremity Color: [Left:Normal] Hair Growth on Extremity: [Left:No] Temperature of Extremity: [Left:Warm] Capillary Refill: [Left:< 3 seconds] Electronic Signature(s) Signed: 08/28/2016 5:23:30 PM By: Montey Hora Entered By: Montey Hora on 08/28/2016 14:48:14 [place], [place] ([id number]) -------------------------------------------------------------------------------- Multi Wound Chart Details Patient Name: [name], [name] Date of Service: 08/28/2016 2:30 PM Medical Record Patient Account Number: [id number] Number: Treating RN: Montey Hora 1946-02-02 (70 y.o. Other Clinician: Date of Birth/Sex: Female) Treating ROBSON, MICHAEL Primary Care Shaana Acocella: Lanier Prude Azlee Monforte/Extender: G Referring Nolita Kutter: Brendia Sacks in Treatment: 12 Vital Signs Height(in): 62 Pulse(bpm): 87 Weight(lbs): 140 Blood Pressure 198/75 (mmHg): Body Mass  Index(BMI): 26 Temperature(F): 98.1 Respiratory Rate 18 (breaths/min): Photos: [N/A:N/A] Wound Location: Left Foot - Plantar Left Foot - Lateral N/A Wounding Event: Gradually Appeared Blister N/A Primary Etiology: Diabetic Wound/Ulcer of Pressure Ulcer N/A the Lower Extremity Secondary Etiology: Pressure Ulcer Diabetic Wound/Ulcer of N/A the Lower Extremity Comorbid History: Hypertension, Type II Hypertension, Type II N/A Diabetes Diabetes Date Acquired: 03/25/2016 06/03/2016 N/A Weeks of Treatment: 12 12 N/A Wound Status: Open Open N/A Measurements L x W x D 0.1x0.1x0.1 0.2x0.4x0.1 N/A (cm) Area (cm) : 0.008 0.063 N/A Volume (cm) : 0.001 0.006 N/A % Reduction in Area: 98.50% 91.10% N/A % Reduction in Volume: 99.60% 91.50% N/A Classification: Grade 2 Category/Stage II N/A  HBO Classification: N/A Grade 1 N/A Exudate Amount: Large Large N/A Exudate Type: Serous Serous N/A Bertran, Charlie L. ([id number]) Exudate Color: amber amber N/A Wound Margin: Flat and Intact Flat and Intact N/A Granulation Amount: Large (67-100%) Large (67-100%) N/A Granulation Quality: Red Pink N/A Necrotic Amount: None Present (0%) None Present (0%) N/A Exposed Structures: Fascia: No Fascia: No N/A Fat Layer (Subcutaneous Fat Layer (Subcutaneous Tissue) Exposed: No Tissue) Exposed: No Tendon: No Tendon: No Muscle: No Muscle: No Joint: No Joint: No Bone: No Bone: No Limited to Skin Limited to Skin Breakdown Breakdown Epithelialization: Small (1-33%) Large (67-100%) N/A Debridement: N/A Debridement ([id number]/A 11047) Pre-procedure N/A 14:45 N/A Verification/Time Out Taken: Pain Control: N/A Lidocaine 4% Topical N/A Solution Tissue Debrided: N/A Fibrin/Slough, Callus, N/A Subcutaneous Level: N/A Skin/Subcutaneous N/A Tissue Debridement Area (sq N/A 0.08 N/A cm): Instrument: N/A Curette N/A Bleeding: N/A Moderate N/A Hemostasis Achieved: N/A Silver Nitrate N/A Procedural Pain: N/A  0 N/A Post Procedural Pain: N/A 0 N/A Debridement Treatment N/A Procedure was tolerated N/A Response: well Post Debridement N/A 0.2x0.4x0.2 N/A Measurements L x W x D (cm) Post Debridement N/A 0.013 N/A Volume: (cm) Post Debridement N/A Category/Stage II N/A Stage: Periwound Skin Texture: Callus: Yes Excoriation: No N/A Excoriation: No Induration: No Induration: No Callus: No Crepitus: No Crepitus: No Rash: No Rash: No Scarring: No Scarring: No Periwound Skin Maceration: No Maceration: No N/A Moisture: Dry/Scaly: No Dry/Scaly: No [name], [name] ([id number]) Periwound Skin Color: Atrophie Blanche: No Atrophie Blanche: No N/A Cyanosis: No Cyanosis: No Ecchymosis: No Ecchymosis: No Erythema: No Erythema: No Hemosiderin Staining: No Hemosiderin Staining: No Mottled: No Mottled: No Pallor: No Pallor: No Rubor: No Rubor: No Temperature: N/A No Abnormality N/A Tenderness on No No N/A Palpation: Wound Preparation: Ulcer Cleansing: Ulcer Cleansing: N/A Rinsed/Irrigated with Rinsed/Irrigated with Saline Saline Topical Anesthetic Topical Anesthetic Applied: Other: lidocaine Applied: None 4% Procedures Performed: N/A Debridement N/A Treatment Notes Electronic Signature(s) Signed: 08/28/2016 4:59:22 PM By: Linton Ham MD Entered By: Linton Ham on 08/28/2016 15:28:48 [place], [place] ([id number]) -------------------------------------------------------------------------------- [place] Details Patient Name: [name], [name] Date of Service: 08/28/2016 2:30 PM Medical Record Patient Account Number: [id number] Number: Treating RN: Montey Hora 30-Jul-1945 (70 y.o. Other Clinician: Date of Birth/Sex: Female) Treating ROBSON, MICHAEL Primary Care Dravin Lance: Lanier Prude Pride Gonzales/Extender: G Referring Latressa Harries: Brendia Sacks in Treatment: 12 Active Inactive ` Medication Nursing Diagnoses: Knowledge deficit  related to medication safety: actual or potential Goals: Patient/caregiver will demonstrate understanding of new oral/IV medications prescribed at the [facility] (topical prescriptions are covered under the skin breakdown problem) Date Initiated: 06/05/2016 Target Resolution Date: 10/03/2016 Goal Status: Active Interventions: Assess patient/caregiver ability to manage medication regimen upon admission and as needed Patient/Caregiver given reconciled medication list upon admission, changes in medications and discharge from the [place] Notes: ` Nutrition Nursing Diagnoses: Potential for alteratiion in Nutrition/Potential for imbalanced nutrition Goals: Patient/caregiver verbalizes understanding of need to maintain therapeutic glucose control per primary care physician Date Initiated: 06/05/2016 Target Resolution Date: 10/03/2016 Goal Status: Active Interventions: Provide education on elevated blood sugars and impact on wound healing Notes: [place], [employer]. ([id number]) Orientation to the Wound Care Program Nursing Diagnoses: Knowledge deficit related to the wound healing center program Goals: Patient/caregiver will verbalize understanding of the [place] Program Date Initiated: 06/05/2016 Target Resolution Date: 10/03/2016 Goal Status: Active Interventions: Provide education on orientation to the wound center Notes: ` Pressure Nursing Diagnoses: Knowledge deficit related to management of pressures ulcers Goals:  Patient will remain free of pressure ulcers Date Initiated: 06/05/2016 Target Resolution Date: 10/03/2016 Goal Status: Active Interventions: Assess: immobility, friction, shearing, incontinence upon admission and as needed Treatment Activities: Patient referred for seating evaluation to ensure proper offloading : 06/05/2016 Notes: ` Wound/Skin Impairment Nursing Diagnoses: Impaired tissue integrity Goals: Ulcer/skin breakdown will heal within 14  weeks Date Initiated: 06/05/2016 Target Resolution Date: 10/03/2016 Goal Status: Active Interventions: Assess patient/caregiver ability to obtain necessary supplies Voges, [place]. ([id number]) Notes: Electronic Signature(s) Signed: 08/28/2016 5:23:30 PM By: Montey Hora Entered By: Montey Hora on 08/28/2016 14:48:40 [name], [name] ([id number]) -------------------------------------------------------------------------------- Pain Assessment Details Patient Name: [name], [name] Date of Service: 08/28/2016 2:30 PM Medical Record Patient Account Number: [id number] Number: Treating RN: Montey Hora [birth year] (70 y.o. Other Clinician: Date of Birth/Sex: Female) Treating ROBSON, MICHAEL Primary Care Lasheka [name]: Lanier Prude Dia Jefferys/Extender: G Referring Carlisha Wisler: Brendia Sacks in Treatment: 12 Active Problems Location of Pain Severity and Description of Pain Patient Has Paino No Site Locations Pain Management and Medication Current Pain Management: Notes Topical or injectable lidocaine is offered to patient for acute pain when surgical debridement is performed. If needed, Patient is instructed to use over the counter pain medication for the following 24-48 hours after debridement. Wound care MDs do not prescribed pain medications. Patient has chronic pain or uncontrolled pain. Patient has been instructed to make an appointment with their Primary Care Physician for pain management. Electronic Signature(s) Signed: 08/28/2016 5:23:30 PM By: Montey Hora Entered By: Montey Hora on 08/28/2016 14:32:05 [place], [place] Carlean Jews ([id number]) -------------------------------------------------------------------------------- Patient/Caregiver Education Details Patient Name: [name], [name] Date of Service: 08/28/2016 2:30 PM Medical Record Patient Account Number: [id number] Number: Treating RN: Montey Hora Mar 18, 1946 (70 y.o. Other Clinician: Date of  Birth/Gender: Female) Treating ROBSON, MICHAEL Primary Care Physician/Extender: Ashok Croon Physician: Weeks in Treatment: 12 Referring Physician: Lanier Prude Education Assessment Education Provided To: Patient Education Topics Provided Wound/Skin Impairment: Handouts: Other: wound care as ordered Methods: Demonstration, Explain/Verbal Responses: State content correctly Electronic Signature(s) Signed: 08/28/2016 5:23:30 PM By: Montey Hora Entered By: Montey Hora on 08/28/2016 16:31:08 [place], [place] ([id number]) -------------------------------------------------------------------------------- Wound Assessment Details Patient Name: [name], [name] Date of Service: 08/28/2016 2:30 PM Medical Record Patient Account Number: [id number] Number: Treating RN: Montey Hora 14-Nov-1945 (70 y.o. Other Clinician: Date of Birth/Sex: Female) Treating ROBSON, MICHAEL Primary Care Johanne Mcglade: Lanier Prude Ellenor Wisniewski/Extender: G Referring Candise Crabtree: Brendia Sacks in Treatment: 12 Wound Status Wound Number: 2 Primary Etiology: Diabetic Wound/Ulcer of the Lower Extremity Wound Location: Left Foot - Plantar Secondary Pressure Ulcer Wounding Event: Gradually Appeared Etiology: Date Acquired: 03/25/2016 Wound Status: Open Weeks Of Treatment: 12 Comorbid Hypertension, Type II Diabetes Clustered Wound: No History: Photos Wound Measurements Length: (cm) 0.1 % Reduction in Area: Width: (cm) 0.1 % Reduction in Volum Depth: (cm) 0.1 Epithelialization: Area: (cm) 0.008 Tunneling: Volume: (cm) 0.001 Undermining: 98.5% e: 99.6% Small (1-33%) No No Wound Description Classification: Grade 2 Foul Odor After Clea Wound Margin: Flat and Intact Slough/Fibrino Exudate Amount: Large Exudate Type: Serous Exudate Color: amber nsing: No No Wound Bed Granulation Amount: Large (67-100%) Exposed Structure Granulation Quality: Red Fascia Exposed: No Necrotic Amount:  None Present (0%) Fat Layer (Subcutaneous Tissue) Exposed: No Tozer, Vonette L. ([id number]) Tendon Exposed: No Muscle Exposed: No Joint Exposed: No Bone Exposed: No Limited to Skin Breakdown Periwound Skin Texture Texture Color No Abnormalities Noted: No No Abnormalities Noted: No Callus: Yes Atrophie Blanche: No Crepitus: No Cyanosis: No Excoriation: No Ecchymosis: No  Induration: No Erythema: No Rash: No Hemosiderin Staining: No Scarring: No Mottled: No Pallor: No Moisture Rubor: No No Abnormalities Noted: No Dry / Scaly: No Maceration: No Wound Preparation Ulcer Cleansing: Rinsed/Irrigated with Saline Topical Anesthetic Applied: Other: lidocaine 4%, Treatment Notes Wound #2 (Left, Plantar Foot) 1. Cleansed with: Clean wound with Normal Saline 5. Secondary Dressing Applied Foam Kerlix/Conform Electronic Signature(s) Signed: 08/28/2016 5:23:30 PM By: Montey Hora Entered By: Montey Hora on 08/28/2016 14:47:08 [name], [name] ([id number]) -------------------------------------------------------------------------------- Wound Assessment Details Patient Name: [name], [name] Date of Service: 08/28/2016 2:30 PM Medical Record Patient Account Number: [id number] Number: Treating RN: Montey Hora 06/27/1946 (70 y.o. Other Clinician: Date of Birth/Sex: Female) Treating ROBSON, MICHAEL Primary Care Lilliam Chamblee: Lanier Prude Ailanie Ruttan/Extender: G Referring Bennie Scaff: Brendia Sacks in Treatment: 12 Wound Status Wound Number: 3 Primary Etiology: Pressure Ulcer Wound Location: Left Foot - Lateral Secondary Diabetic Wound/Ulcer of the Lower Etiology: Extremity Wounding Event: Blister Wound Status: Open Date Acquired: 06/03/2016 Comorbid Hypertension, Type II Diabetes Weeks Of Treatment: 12 History: Clustered Wound: No Photos Wound Measurements Length: (cm) 0.2 Width: (cm) 0.4 Depth: (cm) 0.1 Area: (cm) 0.063 Volume: (cm) 0.006 %  Reduction in Area: 91.1% % Reduction in Volume: 91.5% Epithelialization: Large (67-100%) Tunneling: No Undermining: No Wound Description Classification: Category/Stage II Foul Odor A Diabetic Severity (Wagner): Grade 1 Wound Margin: Flat and Intact Exudate Amount: Large Exudate Type: Serous Exudate Color: amber fter Cleansing: No Wound Bed Granulation Amount: Large (67-100%) Exposed Structure Granulation Quality: Pink Fascia Exposed: No Giglia, An L. ([id number]) Necrotic Amount: None Present (0%) Fat Layer (Subcutaneous Tissue) Exposed: No Tendon Exposed: No Muscle Exposed: No Joint Exposed: No Bone Exposed: No Limited to Skin Breakdown Periwound Skin Texture Texture Color No Abnormalities Noted: No No Abnormalities Noted: No Callus: No Atrophie Blanche: No Crepitus: No Cyanosis: No Excoriation: No Ecchymosis: No Induration: No Erythema: No Rash: No Hemosiderin Staining: No Scarring: No Mottled: No Pallor: No Moisture Rubor: No No Abnormalities Noted: No Dry / Scaly: No Temperature / Pain Maceration: No Temperature: No Abnormality Wound Preparation Ulcer Cleansing: Rinsed/Irrigated with Saline Topical Anesthetic Applied: None Treatment Notes Wound #3 (Left, Lateral Foot) 1. Cleansed with: Clean wound with Normal Saline 2. Anesthetic Topical Lidocaine 4% cream to wound bed prior to debridement 4. Dressing Applied: Prisma Ag 5. Secondary Dressing Applied Foam Gauze and Kerlix/Conform Electronic Signature(s) Signed: 08/28/2016 5:23:30 PM By: Montey Hora Entered By: Montey Hora on 08/28/2016 14:47:49 [place]ned[place], Uma L. ([id number]) -------------------------------------------------------------------------------- Vitals Details Patient Name: Prehn, Carlotta L. Date of Service: 08/28/2016 2:30 PM Medical Record Patient Account Number: [id number] Number: Treating RN: Montey Hora 1946/04/28 (70 y.o. Other Clinician: Date of  Birth/Sex: Female) Treating ROBSON, MICHAEL Primary Care Seung Nidiffer: Lanier Prude Deric Bocock/Extender: G Referring Domnique Vanegas: Brendia Sacks in Treatment: 12 Vital Signs Time Taken: 14:32 Temperature (F): 98.1 Height (in): 62 Pulse (bpm): 87 Weight (lbs): 140 Respiratory Rate (breaths/min): 18 Body Mass Index (BMI): 25.6 Blood Pressure (mmHg): 198/75 Reference Range: 80 - 120 mg / dl Electronic Signature(s) Signed: 08/28/2016 5:23:30 PM By: Montey Hora Entered By: Montey Hora on 08/28/2016 14:34:51

## 2016-09-03 ENCOUNTER — Encounter: Payer: Medicare Other | Admitting: Internal Medicine

## 2016-09-03 ENCOUNTER — Other Ambulatory Visit
Admission: RE | Admit: 2016-09-03 | Discharge: 2016-09-03 | Disposition: A | Discharge status: Home / Self Care | Payer: Medicare Other | Source: Ambulatory Visit | Attending: Internal Medicine | Admitting: Internal Medicine

## 2016-09-03 ENCOUNTER — Other Ambulatory Visit: Payer: Self-pay | Admitting: Internal Medicine

## 2016-09-03 ENCOUNTER — Ambulatory Visit
Admission: RE | Admit: 2016-09-03 | Discharge: 2016-09-03 | Disposition: A | Discharge status: Home / Self Care | Payer: Medicare Other | Source: Ambulatory Visit | Attending: Internal Medicine | Admitting: Internal Medicine

## 2016-09-03 DIAGNOSIS — T148XXA Other injury of unspecified body region, initial encounter: Secondary | ICD-10-CM | POA: Diagnosis present

## 2016-09-03 DIAGNOSIS — M19072 Primary osteoarthritis, left ankle and foot: Secondary | ICD-10-CM | POA: Insufficient documentation

## 2016-09-03 DIAGNOSIS — L97529 Non-pressure chronic ulcer of other part of left foot with unspecified severity: Secondary | ICD-10-CM | POA: Diagnosis not present

## 2016-09-03 DIAGNOSIS — T814XXS Infection following a procedure, sequela: Principal | ICD-10-CM

## 2016-09-03 DIAGNOSIS — E11621 Type 2 diabetes mellitus with foot ulcer: Secondary | ICD-10-CM | POA: Insufficient documentation

## 2016-09-03 DIAGNOSIS — IMO0001 Reserved for inherently not codable concepts without codable children: Secondary | ICD-10-CM

## 2016-09-04 ENCOUNTER — Ambulatory Visit: Payer: Medicare Other | Admitting: Internal Medicine

## 2016-09-05 NOTE — Progress Notes (Addendum)
ALEXANDRYA, CHIM ([id number]) Visit Report for 09/03/2016 Chief Complaint Document Details Patient Name: [name], [name] Date of Service: 09/03/2016 11:00 AM Medical Record Patient Account Number: [id number] Number: Treating RN: Montey Hora February 04, 1946 (71 y.o. Other Clinician: Date of Birth/Sex: Female) Treating Jalea Bronaugh Primary Care Provider: Lanier Prude Provider/Extender: G Referring Provider: Brendia Sacks in Treatment: 12 Information Obtained from: Patient Chief Complaint 06/05/16; patient is here for review of a wound on her right foot which is been present for at least a month Electronic Signature(s) Signed: 09/04/2016 3:56:26 PM By: Linton Ham MD Entered By: Linton Ham on 09/03/2016 12:05:15 Derk, Raechal L. ([id number]) -------------------------------------------------------------------------------- Debridement Details Patient Name: [name], [name] Date of Service: 09/03/2016 11:00 AM Medical Record Patient Account Number: [id number] Number: Treating RN: Montey Hora 1946/01/03 (71 y.o. Other Clinician: Date of Birth/Sex: Female) Treating Levonne Carreras Primary Care Provider: Lanier Prude Provider/Extender: G Referring Provider: Brendia Sacks in Treatment: 12 Debridement Performed for Wound #3 Left,Lateral Foot Assessment: Performed By: Physician Ricard Dillon, MD Debridement: Debridement Pre-procedure Yes - 11:29 Verification/Time Out Taken: Start Time: 11:29 Pain Control: Lidocaine 4% Topical Solution Level: Skin/Subcutaneous Tissue Total Area Debrided (L x 0.4 (cm) x 0.6 (cm) = 0.24 (cm) W): Tissue and other Viable, Non-Viable, Callus, Fibrin/Slough, Subcutaneous material debrided: Instrument: Curette Specimen: Swab Number of Specimens 1 Taken: Bleeding: Moderate Hemostasis Achieved: Silver Nitrate End Time: 11:32 Procedural Pain: 0 Post Procedural Pain: 0 Response to Treatment: Procedure was  tolerated well Post Debridement Measurements of Total Wound Length: (cm) 0.4 Stage: Category/Stage II Width: (cm) 0.6 Depth: (cm) 0.3 Volume: (cm) 0.057 Character of Wound/Ulcer Post Improved Debridement: Severity of Tissue Post Fat layer exposed Debridement: Post Procedure Diagnosis Same as Pre-procedure [place]wood, Tashina LMarland Kitchen ([id number]) Electronic Signature(s) Signed: 09/03/2016 4:59:35 PM By: Montey Hora Signed: 09/04/2016 3:56:26 PM By: Linton Ham MD Entered By: Linton Ham on 09/03/2016 12:04:56 [place], [name] ([id number]) -------------------------------------------------------------------------------- HPI Details Patient Name: [name], [name] Date of Service: 09/03/2016 11:00 AM Medical Record Patient Account Number: [id number] Number: Treating RN: Montey Hora 11/29/1945 (71 y.o. Other Clinician: Date of Birth/Sex: Female) Treating Delois Silvester Primary Care Provider: Lanier Prude Provider/Extender: G Referring Provider: Brendia Sacks in Treatment: 12 History of Present Illness HPI Description: The patient is here for followup. Denies fevers, has been compliant with wearing Darco shoes. She states that her blood sugars are still in the low 200s. [name] is a 71F with a h/o DM who presents with an open ulcer on the plantar aspect of her L great toe. She has had it for close to 8 months. She has also had several debridements as well as courses of oral antibiotics. Currently, she denies fevers and has no pain. She has never had ulcers like this in the past. Her blood sugars are normally in the 170s. Foot xray was negative for osteomyelitis. HgbA1c was 10.3. READMISSION; 06/05/16; [name] is a 71 year old type II diabetic by description not well-controlled. She does not have known PAD or claudication. She has apparently some degree of neuropathy or she's been told that in the past. She has a history of a wound on the plantar left foot  foot for which she was seen here in 2015 although I was not involved in her care nor were any other current physicians working in our clinics. She was managed with standard dressings and a Darco forefoot offloading boot and she seems to have healed quite quickly. Apparently the patient noticed a callus/wound development  a month ago although her daughter who is present states it was longer than that. Indeed the patient seems to been followed for at least 3 clinic visits with her podiatrist Dr. Gardiner Barefoot. On October 19 noted a necrotic ulcer on the plantar aspect of the left first metatarsal head. She was using Silvadene based dressings given Keflex on 10/9. I don't believe any cultures or x-rays were done. More importantly she doesn't seem to be adequately offloading this area in her footwear ABIs in this clinic were 0.88 bilaterally. 06/12/16; continued follow-up for a central wound over the left first metatarsal head. Once again today required an extensive debridement. She is using a Darco forefoot offloading boot. Her x-ray of the foot was negative for any bone changes. She has not yet had vascular studies. ABI in this clinic was 0.88 06/19/16; formal arterial studies for early January for her daughter. In general the major wound over the plantar left first metatarsal head looks better. Area on her lateral fifth metatarsal head looks like it's proceeding towards closure. She is wearing the Darco forefoot offloading sandal was some difficulty 06/26/16; formal arterial studies towboat for early January. The area on the plantar left first metatarsal head apparently measures 0.2 x 0.2 mm smaller. She has the same surface slough and some nonviable surrounding tissue which is debrided with a curet. This cleans up quite nicely. The area on her lateral fifth metatarsal head is still fully epithelialized Weightman, [name] ([id number]) 07/03/16; formal arterial studies booked for early January. The  areas on the plantar left first metatarsal head smaller by about 0.1 mmo. His is not making quite is much progress as I was like. She is using a Darco forefoot offload her 07/10/16; she had her arterial studies today by Dr. [name]. I do not have the formal report however patient's daughter states she has "small vessel disease" presumably this means that there is not a revascularizable element. 07/12/2016 -- the arterial studies were done in the lab of Dr. Fletcher Anon, and he will be seeing the patient on 07/19/2016. the duplex examination showed diffuse plaque bilaterally with a 50-74% stenosis of the right SFA. Three- vessel runoff on the right two-vessel runoff on the left. The ABI on the right was 0.89 on the left was 0.95 and the TBI on the right was 0.52 and on the left was 0.36 07/17/16; her arterial studies are noted. She had a 50-74% stenosis of the right SFA three-vessel runoff on the right two-vessel runoff on the left. Her wound is on the left first metatarsal head. She had an appointment with Dr. Fletcher Anon however that had to reschedule list. The patient is traveling over the weekend and therefore she requested not to have the total contact cast replaced. 08/12/2016 -- the patient has not been back to see [place] for the last 25 days and comes in with an abscess on the plantar aspect of her left foot and the area on the left lateral foot is also an open wound now. 08/20/16; the patient arrived last week with an abscess on the left first plantar head. This was fully excised by Dr. Con Memos. CNS done showed Staphylococcus Lugdunensis. This was not specifically plated against Augmentin although I think it probably should've worked. Her foot is a lot better. As I understand things this is a coag-negative Staphylococcus species however unlike other coag-negative staph this should be treated. The patient appears to be a lot better 08/28/16; she is completed the dicloxacillin. Area on the left  first plantar  metatarsal head is completely resolved and epithelialized. The area on the lateral aspect of her left fifth metatarsal head however is still problematic 09/03/16; the patient's area on her left first plantar metatarsal head remains closed. However the lateral fifth metatarsal head wound continues to be problematic. Her daughter shows me pictures from Saturday on his cell phone that show intense erythema around the wound. She had completed antibiotics last week however that was for culture over the plantar first metatarsal head. Electronic Signature(s) Signed: 09/04/2016 3:56:26 PM By: Linton Ham MD Entered By: Linton Ham on 09/03/2016 12:10:18 [place], [name] ([id number]) -------------------------------------------------------------------------------- Physical Exam Details Patient Name: Escher, Cathryn L. Date of Service: 09/03/2016 11:00 AM Medical Record Patient Account Number: [id number] Number: Treating RN: Montey Hora August 22, 1945 (70 y.o. Other Clinician: Date of Birth/Sex: Female) Treating Lovell Roe Primary Care Provider: Lanier Prude Provider/Extender: G Referring Provider: Brendia Sacks in Treatment: 12 Constitutional Patient is hypertensive.. Supine Blood Pressure is within target range for patient.. Pulse regular and within target range for patient.Marland Kitchen Respirations regular, non-labored and within target range.. Patient's appearance is neat and clean. Appears in no acute distress. Well nourished and well developed.. Cardiovascular Faint dorsalis pedis pulses in the left foot. Notes Wound exam; the area on her plantar first metatarsal head remains resolved. However the area over her left fifth metatarsal head does not look satisfactory. I cannot see a viable base. I attempted debridement with a #3 curet however the wound has some relative depth and I could not get to a viable surface. She has some undermining especially inferiorly. There may be  some slight erythema spreading around the foot dorsally for a small area but it is nothing like the erythema from Saturday on the cell phone pictures. Amaryl at a loss to really explain this. I don't believe that there is any involvement of the metatarsal joints Electronic Signature(s) Signed: 09/04/2016 3:56:26 PM By: Linton Ham MD Entered By: Linton Ham on 09/03/2016 12:12:53 [place], [place] Muerto ([id number]) -------------------------------------------------------------------------------- Physician Orders Details Patient Name: Vilar, Paisely L. Date of Service: 09/03/2016 11:00 AM Medical Record Patient Account Number: [id number] Number: Treating RN: Montey Hora [birth year] (70 y.o. Other Clinician: Date of Birth/Sex: Female) Treating Kaley Jutras Primary Care Provider: Lanier Prude Provider/Extender: G Referring Provider: Brendia Sacks in Treatment: 12 Verbal / Phone Orders: No Diagnosis Coding Wound Cleansing Wound #2 Left,Plantar Foot o Clean wound with Normal Saline. Wound #3 Left,Lateral Foot o Clean wound with Normal Saline. Anesthetic Wound #2 Left,Plantar Foot o Topical Lidocaine 4% cream applied to wound bed prior to debridement Wound #3 Left,Lateral Foot o Topical Lidocaine 4% cream applied to wound bed prior to debridement Primary Wound Dressing Wound #2 Left,Plantar Foot o Foam Wound #3 Left,Lateral Foot o Santyl Ointment Secondary Dressing Wound #2 Left,Plantar Foot o ABD and Kerlix/Conform - secure lightly with coban o Foam Wound #3 Left,Lateral Foot o ABD and Kerlix/Conform - secure lightly with coban o Foam Dressing Change Frequency Wound #2 Left,Plantar Foot o Change dressing every other day. Ector, Philip ([id number]) Wound #3 Left,Lateral Foot o Change dressing every other day. Follow-up Appointments Wound #2 [place] o Return Appointment in 1 week. Wound #3 Left,Lateral Foot o  Return Appointment in 1 week. Off-Loading Wound #2 Left,Plantar Foot o Other: - wide shoe Wound #3 Left,Lateral Foot o Other: - wide shoe Additional Orders / Instructions Wound #2 Left,Plantar Foot o Increase protein intake. Wound #3 Left,Lateral Foot o Increase protein intake. Laboratory o  Bacteria identified in Wound by Culture (MICRO) - left lateral foot oooo LOINC Code: 9357-0 VXBL Convenience Name: Wound culture routine Radiology o X-ray, foot - left Patient Medications Allergies: codeine, seasonal allergies Notifications Medication Indication Start End Augmentin 09/03/2016 DOSE bid - oral 875 mg-125 mg tablet - bid tablet oral Electronic Signature(s) Signed: 09/03/2016 12:18:17 PM By: Linton Ham MD [place], [name] ([id number]) Entered By: Linton Ham on 09/03/2016 12:18:16 [place], [place] L. ([id number]) -------------------------------------------------------------------------------- Problem List Details Patient Name: Beverley, Angelly L. Date of Service: 09/03/2016 11:00 AM Medical Record Patient Account Number: [id number] Number: Treating RN: Montey Hora 1946-01-16 (70 y.o. 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 Date of Service: 09/03/2016 11:00 AM Medical Record Patient Account Number: [id number] Number: Treating RN: Montey Hora [birth year] (70 y.o. Other Clinician: Date of Birth/Sex: Female) Treating Adhvik Canady Primary Care Provider: Lanier Prude Provider/Extender: G Referring Provider: Brendia Sacks in Treatment: 12 Subjective Chief Complaint Information obtained from Patient 06/05/16; patient is here for review of a wound on her right foot which is been present for at least a month History of Present Illness (HPI) The patient is here for followup. Denies fevers, has been compliant with wearing Darco shoes. She states that her blood sugars are still in the low 200s. Lucciana is a 83F with a h/o DM who presents with an open ulcer on the plantar aspect of her L great toe. She has had it for close to 8 months. She has also had several debridements as well as courses of oral antibiotics. Currently, she denies fevers and has no pain. She has never had ulcers like this in the past. Her blood sugars are normally in the 170s. Foot xray was negative for osteomyelitis. HgbA1c was 10.3. READMISSION; 06/05/16; Mrs. Harkless is a 71 year old type II diabetic by description not well-controlled. She does not have known PAD or claudication. She has apparently some degree of neuropathy or she's been told that in the past. She has a history of a wound on the plantar left foot foot for which she was seen here in 2015 although I was not involved in her care nor were any other current physicians working in our clinics. She was managed with standard dressings and a Darco forefoot offloading boot and she seems to have healed quite quickly. Apparently the patient noticed a callus/wound development a month ago although her daughter who is present states it was longer than that. Indeed the patient seems to been followed for at least 3 clinic visits with her  podiatrist Dr. Gardiner Barefoot. On October 19 noted a necrotic ulcer on the plantar aspect of the left first metatarsal head. She was using Silvadene based dressings given Keflex on 10/9. I don't believe any cultures or x-rays were done. More importantly she doesn't seem to be adequately offloading this area in her footwear ABIs in this clinic were 0.88 bilaterally. 06/12/16; continued follow-up for a central wound over the left  first metatarsal head. Once again today required an extensive debridement. She is using a Darco forefoot offloading boot. Her x-ray of the foot was negative for any bone changes. She has not yet had vascular studies. ABI in this clinic was 0.88 06/19/16; formal arterial studies for early January for her daughter. In general the major wound over the Doggett, Gregory ([id number]) plantar left first metatarsal head looks better. Area on her lateral fifth metatarsal head looks like it's proceeding towards closure. She is wearing the Darco forefoot offloading sandal was some difficulty 06/26/16; formal arterial studies towboat for early January. The area on the plantar left first metatarsal head apparently measures 0.2 x 0.2 mm smaller. She has the same surface slough and some nonviable surrounding tissue which is debrided with a curet. This cleans up quite nicely. The area on her lateral fifth metatarsal head is still fully epithelialized 07/03/16; formal arterial studies booked for early January. The areas on the plantar left first metatarsal head smaller by about 0.1 mm. His is not making quite is much progress as I was like. She is using a Darco forefoot offload her 07/10/16; she had her arterial studies today by Dr. [name]. I do not have the formal report however patient's daughter states she has "small vessel disease" presumably this means that there is not a revascularizable element. 07/12/2016 -- the arterial studies were done in the lab of Dr. Fletcher Anon, and he will be  seeing the patient on 07/19/2016. the duplex examination showed diffuse plaque bilaterally with a 50-74% stenosis of the right SFA. Three- vessel runoff on the right two-vessel runoff on the left. The ABI on the right was 0.89 on the left was 0.95 and the TBI on the right was 0.52 and on the left was 0.36 07/17/16; her arterial studies are noted. She had a 50-74% stenosis of the right SFA three-vessel runoff on the right two-vessel runoff on the left. Her wound is on the left first metatarsal head. She had an appointment with Dr. Fletcher Anon however that had to reschedule list. The patient is traveling over the weekend and therefore she requested not to have the total contact cast replaced. 08/12/2016 -- the patient has not been back to see [place] for the last 25 days and comes in with an abscess on the plantar aspect of her left foot and the area on the left lateral foot is also an open wound now. 08/20/16; the patient arrived last week with an abscess on the left first plantar head. This was fully excised by Dr. Con Memos. CNS done showed Staphylococcus Lugdunensis. This was not specifically plated against Augmentin although I think it probably should've worked. Her foot is a lot better. As I understand things this is a coag-negative Staphylococcus species however unlike other coag-negative staph this should be treated. The patient appears to be a lot better 08/28/16; she is completed the dicloxacillin. Area on the left first plantar metatarsal head is completely resolved and epithelialized. The area on the lateral aspect of her left fifth metatarsal head however is still problematic 09/03/16; the patient's area on her left first plantar metatarsal head remains closed. However the lateral fifth metatarsal head wound continues to be problematic. Her daughter shows me pictures from Saturday on his cell phone that show intense erythema around the wound. She had completed antibiotics last week however that was for  culture over the plantar first metatarsal head. Objective Constitutional Patient is hypertensive.. Supine Blood Pressure is within target range for patient.. Pulse regular  and within target range for patient.Marland Kitchen Respirations regular, non-labored and within target range.. Patient's appearance is neat and clean. Appears in no acute distress. Well nourished and well developed.Marland Kitchen [place], [place] ([id number]) Vitals Time Taken: 11:13 AM, Height: 62 in, Weight: 140 lbs, BMI: 25.6, Temperature: 98.3 F, Pulse: 86 bpm, Respiratory Rate: 18 breaths/min, Blood Pressure: 190/76 mmHg. Cardiovascular Faint dorsalis pedis pulses in the left foot. General Notes: Wound exam; the area on her plantar first metatarsal head remains resolved. However the area over her left fifth metatarsal head does not look satisfactory. I cannot see a viable base. I attempted debridement with a #3 curet however the wound has some relative depth and I could not get to a viable surface. She has some undermining especially inferiorly. There may be some slight erythema spreading around the foot dorsally for a small area but it is nothing like the erythema from Saturday on the cell phone pictures. Amaryl at a loss to really explain this. I don't believe that there is any involvement of the metatarsal joints Integumentary (Hair, Skin) Wound #2 status is Open. Original cause of wound was Gradually Appeared. The wound is located on the [place]. The wound measures 0.1cm length x 0.1cm width x 0.1cm depth; 0.008cm^2 area and 0.001cm^3 volume. The wound is limited to skin breakdown. There is no tunneling or undermining noted. There is a large amount of serous drainage noted. The wound margin is flat and intact. There is large (67- 100%) red granulation within the wound bed. There is no necrotic tissue within the wound bed. The periwound skin appearance exhibited: Callus. The periwound skin appearance did not exhibit:  Crepitus, Excoriation, Induration, Rash, Scarring, Dry/Scaly, Maceration, Atrophie Blanche, Cyanosis, Ecchymosis, Hemosiderin Staining, Mottled, Pallor, Rubor, Erythema. Wound #3 status is Open. Original cause of wound was Blister. The wound is located on the Left,Lateral Foot. The wound measures 0.4cm length x 0.6cm width x 0.2cm depth; 0.188cm^2 area and 0.038cm^3 volume. The wound is limited to skin breakdown. There is no tunneling or undermining noted. There is a large amount of serous drainage noted. The wound margin is flat and intact. There is large (67-100%) pink granulation within the wound bed. There is no necrotic tissue within the wound bed. The periwound skin appearance did not exhibit: Callus, Crepitus, Excoriation, Induration, Rash, Scarring, Dry/Scaly, Maceration, Atrophie Blanche, Cyanosis, Ecchymosis, Hemosiderin Staining, Mottled, Pallor, Rubor, Erythema. Periwound temperature was noted as No Abnormality. Assessment Active Problems ICD-10 E11.621 - Type 2 diabetes mellitus with foot ulcer L97.522 - Non-pressure chronic ulcer of other part of left foot with fat layer exposed L02.612 - Cutaneous abscess of left foot Venditto, Tenesia L. ([id number]) Procedures Wound #3 Wound #3 is a Pressure Ulcer located on the Left,Lateral Foot . There was a Skin/Subcutaneous Tissue Debridement ([id number]) debridement with total area of 0.24 sq cm performed by Ricard Dillon, MD. with the following instrument(s): Curette to remove Viable and Non-Viable tissue/material including Fibrin/Slough, Callus, and Subcutaneous after achieving pain control using Lidocaine 4% Topical Solution. 1 Specimen was taken by a Swab and sent to the lab per facility protocol.A time out was conducted at 11:29, prior to the start of the procedure. A Moderate amount of bleeding was controlled with Silver Nitrate. The procedure was tolerated well with a pain level of 0 throughout and a pain level of 0 following  the procedure. Post Debridement Measurements: 0.4cm length x 0.6cm width x 0.3cm depth; 0.057cm^3 volume. Post debridement Stage noted as Category/Stage II. Character of Wound/Ulcer Post  Debridement is improved. Severity of Tissue Post Debridement is: Fat layer exposed. Post procedure Diagnosis Wound #3: Same as Pre-Procedure Plan Wound Cleansing: Wound #2 Left,Plantar Foot: Clean wound with Normal Saline. Wound #3 Left,Lateral Foot: Clean wound with Normal Saline. Anesthetic: Wound #2 Left,Plantar Foot: Topical Lidocaine 4% cream applied to wound bed prior to debridement Wound #3 Left,Lateral Foot: Topical Lidocaine 4% cream applied to wound bed prior to debridement Primary Wound Dressing: Wound #2 Left,Plantar Foot: Foam Wound #3 Left,Lateral Foot: Santyl Ointment Secondary Dressing: Wound #2 Left,Plantar Foot: ABD and Kerlix/Conform - secure lightly with coban Foam Wound #3 Left,Lateral Foot: ABD and Kerlix/Conform - secure lightly with coban Foam Dressing Change Frequency: Wound #2 Left,Plantar Foot: Lehmkuhl, Jisela L. ([id number]) Change dressing every other day. Wound #3 Left,Lateral Foot: Change dressing every other day. Follow-up Appointments: Wound #2 Left,Plantar Foot: Return Appointment in 1 week. Wound #3 Left,Lateral Foot: Return Appointment in 1 week. Off-Loading: Wound #2 Left,Plantar Foot: Other: - wide shoe Wound #3 Left,Lateral Foot: Other: - wide shoe Additional Orders / Instructions: Wound #2 Left,Plantar Foot: Increase protein intake. Wound #3 Left,Lateral Foot: Increase protein intake. Radiology ordered were: X-ray, foot - left Laboratory ordered were: Wound culture routine - left lateral foot The following medication(s) was prescribed: Augmentin oral 875 mg-125 mg tablet bid bid tablet oral starting 09/03/2016 o #1 I'm going to call her in Augmentin 875 twice a day for a week empirically I have cultured the wound given the erythema  predominantly that the daughter's cell phone picture shows me #2 I'm going to use Santyl change every other day to see if we can get to some sort of surface over this #3 the patient has arterial disease and is seen Dr. [name] however I'll need to review what we know about this. #4 I would like to see if we can avoid any friction from footwear and the healing sandal or some form a sandal would seem indicated. #5 I don't have a good sense of the left lateral fifth metatarsal wound. Her daughter mentions that this was a cast injury although I specifically don't remember this. I'll have our staff look through this KASHARA, BLOCHER ([id number]) #6 xray the left foot Electronic Signature(s) Signed: 09/09/2016 4:02:00 PM By: Gretta Cool RN, BSN, Kim RN, BSN Signed: 09/11/2016 8:03:18 AM By: Linton Ham MD Previous Signature: 09/03/2016 12:18:51 PM Version By: Linton Ham MD Entered By: Gretta Cool RN, BSN, Kim on 09/09/2016 16:02:00 [place], [place] ([id number]) -------------------------------------------------------------------------------- SuperBill Details Patient Name: Ouzts, Shena L. Date of Service: 09/03/2016 Medical Record Patient Account Number: [id number] Number: Treating RN: Montey Hora 05-Jun-1946 (70 y.o. Other Clinician: Date of Birth/Sex: Female) Treating Keia Rask, [place] Primary Care Provider: Lanier Prude Provider/Extender: G Referring Provider: Lanier Prude Service Line: Outpatient Weeks in Treatment: 12 Diagnosis Coding ICD-10 Codes Code Description E11.621 Type 2 diabetes mellitus with foot ulcer L97.522 Non-pressure chronic ulcer of other part of left foot with fat layer exposed L02.612 Cutaneous abscess of left foot Facility Procedures CPT4 Code Description: 51700174 11042 - DEB SUBQ TISSUE 20 SQ CM/< ICD-10 Description Diagnosis E11.621 Type 2 diabetes mellitus with foot ulcer L97.522 Non-pressure chronic ulcer of other part of left foot Modifier: with fat  lay Quantity: 1 er exposed Physician Procedures CPT4 Code Description: 9449675 91638 - WC PHYS SUBQ TISS 20 SQ CM ICD-10 Description Diagnosis E11.621 Type 2 diabetes mellitus with foot ulcer L97.522 Non-pressure chronic ulcer of other part of left foot Modifier: with fat laye Quantity: 1 r exposed Electronic Signature(s)  Signed: 09/04/2016 3:56:26 PM By: Linton Ham MD Entered By: Linton Ham on 09/03/2016 12:19:17

## 2016-09-05 NOTE — Progress Notes (Signed)
LAPORCHA, MARCHESI ([id number]) Visit Report for 09/03/2016 Arrival Information Details Patient Name: [name], [name] Date of Service: 09/03/2016 11:00 AM Medical Record Patient Account Number: [id number] Number: Treating RN: [name] 1946/05/10 (70 y.o. Other Clinician: Date of Birth/Sex: Female) Treating [name] Primary Care [name]: [name] [name]/Extender: G Referring [name]: [name] in Treatment: 12 Visit Information History Since Last Visit Added or deleted any medications: No Patient Arrived: Ambulatory Any new allergies or adverse reactions: No Arrival Time: 11:11 Had a fall or experienced change in No Accompanied By: dtr activities of daily living that may affect Transfer Assistance: None risk of falls: Patient Identification Verified: Yes Signs or symptoms of abuse/neglect since last No Secondary Verification Process Yes visito Completed: Hospitalized since last visit: No Patient Has Alerts: Yes Has Dressing in Place as Prescribed: Yes Patient Alerts: DMII Pain Present Now: No Electronic Signature(s) Signed: 09/03/2016 4:59:35 PM By: [name] Entered By: [name] on 09/03/2016 11:12:47 [place], [place] ([id number]) -------------------------------------------------------------------------------- Encounter Discharge Information Details Patient Name: [name], [name] Date of Service: 09/03/2016 11:00 AM Medical Record Patient Account Number: [id number] Number: Treating RN: [name] 09/02/1945 (70 y.o. Other Clinician: Date of Birth/Sex: Female) Treating [name] Primary Care Ebonye Reade: [name] Shanell Aden/Extender: G Referring Daemyn [name]: [name] in Treatment: 12 Encounter Discharge Information Items Discharge Pain Level: 0 Discharge Condition: Stable Ambulatory Status: Ambulatory Discharge Destination: Home Transportation: Private Auto Accompanied By: dtr Schedule  Follow-up Appointment: Yes Medication Reconciliation completed and provided to Patient/Care No [name]: Provided on Clinical Summary of Care: 09/03/2016 Form Type Recipient Paper Patient BR Electronic Signature(s) Signed: 09/03/2016 11:52:43 AM By: [name] Previous Signature: 09/03/2016 11:23:10 AM Version By: [name] Entered By: [name] on 09/03/2016 11:52:42 Hulsebus, [name] ([id number]) -------------------------------------------------------------------------------- Lower Extremity Assessment Details Patient Name: [name], [name] Date of Service: 09/03/2016 11:00 AM Medical Record Patient Account Number: [id number] Number: Treating RN: [name] 1945/12/02 (70 y.o. Other Clinician: Date of Birth/Sex: Female) Treating [name] Primary Care Degan Hanser: [name] Tanasia Budzinski/Extender: G Referring Parminder Trapani: [name] in Treatment: 12 Vascular Assessment Pulses: Dorsalis Pedis Palpable: [Left:Yes] Posterior Tibial Extremity colors, hair growth, and conditions: Extremity Color: [Left:Normal] Hair Growth on Extremity: [Left:No] Temperature of Extremity: [Left:Warm] Capillary Refill: [Left:< 3 seconds] Electronic Signature(s) Signed: 09/03/2016 11:22:17 AM By: [name] Entered By: [name] on 09/03/2016 11:22:17 [name], [name] ([id number]) -------------------------------------------------------------------------------- Multi Wound Chart Details Patient Name: [name], [name] Date of Service: 09/03/2016 11:00 AM Medical Record Patient Account Number: [id number] Number: Treating RN: [name] 10/16/1945 (70 y.o. Other Clinician: Date of Birth/Sex: Female) Treating [name] Primary Care Kalven Ganim: [name] Tysheem Accardo/Extender: G Referring Lannie Yusuf: [name] in Treatment: 12 Vital Signs Height(in): 62 Pulse(bpm): 86 Weight(lbs): 140 Blood Pressure 190/76 (mmHg): Body Mass  Index(BMI): 26 Temperature(F): 98.3 Respiratory Rate 18 (breaths/min): Photos: [N/A:N/A] Wound Location: Left, Plantar Foot Left, Lateral Foot N/A Wounding Event: Gradually Appeared Blister N/A Primary Etiology: Diabetic Wound/Ulcer of Pressure Ulcer N/A the Lower Extremity Secondary Etiology: Pressure Ulcer Diabetic Wound/Ulcer of N/A the Lower Extremity Date Acquired: 03/25/2016 06/03/2016 N/A Weeks of Treatment: 12 12 N/A Wound Status: Open Open N/A Measurements L x W x D 0.1x0.1x0.1 0.4x0.6x0.2 N/A (cm) Area (cm) : 0.008 0.188 N/A Volume (cm) : 0.001 0.038 N/A % Reduction in Area: 98.50% 73.40% N/A % Reduction in Volume: 99.60% 46.50% N/A Classification: Grade 2 Category/Stage II N/A Debridement: N/A Debridement (34193- N/A 11047) Pre-procedure N/A 11:29 N/A Verification/Time Out Taken:  [name], [name] ([id number]) Pain Control: N/A Lidocaine 4% Topical N/A Solution Tissue Debrided: N/A Fibrin/Slough, Callus, N/A Subcutaneous Level: N/A Skin/Subcutaneous N/A Tissue Debridement Area (sq N/A 0.24 N/A cm): Instrument: N/A Curette N/A Specimen: N/A Swab N/A Number of Specimens N/A 1 N/A Taken: Bleeding: N/A Moderate N/A Hemostasis Achieved: N/A Silver Nitrate N/A Procedural Pain: N/A 0 N/A Post Procedural Pain: N/A 0 N/A Debridement Treatment N/A Procedure was tolerated N/A Response: well Post Debridement N/A 0.4x0.6x0.3 N/A Measurements L x W x D (cm) Post Debridement N/A 0.057 N/A Volume: (cm) Post Debridement N/A Category/Stage II N/A Stage: Periwound Skin Texture: No Abnormalities Noted No Abnormalities Noted N/A Periwound Skin No Abnormalities Noted No Abnormalities Noted N/A Moisture: Periwound Skin Color: No Abnormalities Noted No Abnormalities Noted N/A Tenderness on No No N/A Palpation: Procedures Performed: N/A Debridement N/A Treatment Notes Wound #2 (Left, Plantar Foot) 1. Cleansed with: Clean wound with Normal Saline 5. Secondary  Dressing Applied Foam Kerlix/Conform Wound #3 (Left, Lateral Foot) 1. Cleansed with: Clean wound with Normal Saline 2. Anesthetic Topical Lidocaine 4% cream to wound bed prior to debridement 4. Dressing Applied: Santyl Ointment [name], [name] ([id number]) 5. Secondary Dressing Applied Foam Gauze and Kerlix/Conform Electronic Signature(s) Signed: 09/04/2016 3:56:26 PM By: Linton Ham MD Previous Signature: 09/03/2016 11:22:36 AM Version By: [name] Entered By: Linton Ham on 09/03/2016 12:04:21 [name], [name] ([id number]) -------------------------------------------------------------------------------- Hannah Details Patient Name: [name], [name] Date of Service: 09/03/2016 11:00 AM Medical Record Patient Account Number: [id number] Number: Treating RN: [name] [birth year] (70 y.o. Other Clinician: Date of Birth/Sex: Female) Treating [name] Primary Care Tallia Moehring: [name] Johnnathan Hagemeister/Extender: G Referring Lakin Romer: [name] in Treatment: 12 Active Inactive ` Medication Nursing Diagnoses: Knowledge deficit related to medication safety: actual or potential Goals: Patient/caregiver will demonstrate understanding of new oral/IV medications prescribed at the [facility] (topical prescriptions are covered under the skin breakdown problem) Date Initiated: 06/05/2016 Target Resolution Date: 10/03/2016 Goal Status: Active Interventions: Assess patient/caregiver ability to manage medication regimen upon admission and as needed Patient/Caregiver given reconciled medication list upon admission, changes in medications and discharge from the [place] Notes: ` Nutrition Nursing Diagnoses: Potential for alteratiion in Nutrition/Potential for imbalanced nutrition Goals: Patient/caregiver verbalizes understanding of need to maintain therapeutic glucose control per primary care physician Date Initiated:  06/05/2016 Target Resolution Date: 10/03/2016 Goal Status: Active Interventions: Provide education on elevated blood sugars and impact on wound healing Notes: ` Umali, Brooten. ([id number]) Orientation to the Wound Care Program Nursing Diagnoses: Knowledge deficit related to the wound healing center program Goals: Patient/caregiver will verbalize understanding of the [place] [facility] Date Initiated: 06/05/2016 Target Resolution Date: 10/03/2016 Goal Status: Active Interventions: Provide education on orientation to the wound center Notes: ` Pressure Nursing Diagnoses: Knowledge deficit related to management of pressures ulcers Goals: Patient will remain free of pressure ulcers Date Initiated: 06/05/2016 Target Resolution Date: 10/03/2016 Goal Status: Active Interventions: Assess: immobility, friction, shearing, incontinence upon admission and as needed Treatment Activities: Patient referred for seating evaluation to ensure proper offloading : 06/05/2016 Notes: ` Wound/Skin Impairment Nursing Diagnoses: Impaired tissue integrity Goals: Ulcer/skin breakdown will heal within 14 weeks Date Initiated: 06/05/2016 Target Resolution Date: 10/03/2016 Goal Status: Active Interventions: Assess patient/caregiver ability to obtain necessary supplies [place]co, [place]. ([id number]) Notes: Electronic Signature(s) Signed: 09/03/2016 11:22:26 AM By: [name] Entered By: [name] on 09/03/2016 11:22:24 Madole, Lynze L. ([id number]) -------------------------------------------------------------------------------- Pain Assessment Details Patient Name: Florendo, Thanvi L. Date of Service: 09/03/2016  11:00 AM Medical Record Patient Account Number: [id number] Number: Treating RN: [name] 05/19/1946 (70 y.o. Other Clinician: Date of Birth/Sex: Female) Treating [name] Primary Care Diyari Cherne: [name] Redonna Wilbert/Extender: G Referring  Beyonka Pitney: [name] in Treatment: 12 Active Problems Location of Pain Severity and Description of Pain Patient Has Paino No Site Locations Pain Management and Medication Current Pain Management: Notes Topical or injectable lidocaine is offered to patient for acute pain when surgical debridement is performed. If needed, Patient is instructed to use over the counter pain medication for the following 24-48 hours after debridement. Wound care MDs do not prescribed pain medications. Patient has chronic pain or uncontrolled pain. Patient has been instructed to make an appointment with their Primary Care Physician for pain management. Electronic Signature(s) Signed: 09/03/2016 4:59:35 PM By: [name] Entered By: [name] on 09/03/2016 11:13:01 [place]rim[place], [place] ([id number]) -------------------------------------------------------------------------------- Patient/Caregiver Education Details Patient Name: Oien, Pascha L. Date of Service: 09/03/2016 11:00 AM Medical Record Patient Account Number: [id number] Number: Treating RN: [name] [birth year] (70 y.o. Other Clinician: Date of Birth/Gender: Female) Treating [name] Primary Care Physician/Extender: Ashok Croon Physician: Weeks in Treatment: 12 Referring Physician: Lanier [name] Education Assessment Education Provided To: Patient Education Topics Provided Wound/Skin Impairment: Handouts: Other: wound care as ordered Methods: Demonstration, Explain/Verbal Responses: State content correctly Electronic Signature(s) Signed: 09/03/2016 4:59:35 PM By: [name] Entered By: [name] on 09/03/2016 11:23:45 [name], [name] ([id number]) -------------------------------------------------------------------------------- Wound Assessment Details Patient Name: [name], [name] Date of Service: 09/03/2016 11:00 AM Medical Record Patient Account Number:  [id number] Number: Treating RN: [name] [birth year] (70 y.o. Other Clinician: Date of Birth/Sex: Female) Treating [name] Primary Care Ellwood Steidle: [name] Ladasha Schnackenberg/Extender: G Referring Analya Louissaint: [name] in Treatment: 12 Wound Status Wound Number: 2 Primary Etiology: Diabetic Wound/Ulcer of the Lower Extremity Wound Location: Left Foot - Plantar Secondary Pressure Ulcer Wounding Event: Gradually Appeared Etiology: Date Acquired: 03/25/2016 Wound Status: Open Weeks Of Treatment: 12 Comorbid Hypertension, Type II Diabetes Clustered Wound: No History: Photos Wound Measurements Length: (cm) 0.1 % Reduction in Area: Width: (cm) 0.1 % Reduction in Volum Depth: (cm) 0.1 Epithelialization: Area: (cm) 0.008 Tunneling: Volume: (cm) 0.001 Undermining: 98.5% e: 99.6% Small (1-33%) No No Wound Description Classification: Grade 2 Foul Odor After Clea Wound Margin: Flat and Intact Slough/Fibrino Exudate Amount: Large Exudate Type: Serous Exudate Color: amber nsing: No No Wound Bed Granulation Amount: Large (67-100%) Exposed Structure Granulation Quality: Red Fascia Exposed: No Necrotic Amount: None Present (0%) Fat Layer (Subcutaneous Tissue) Exposed: No Lewman, Jakyah L. ([id number]) Tendon Exposed: No Muscle Exposed: No Joint Exposed: No Bone Exposed: No Limited to Skin Breakdown Periwound Skin Texture Texture Color No Abnormalities Noted: No No Abnormalities Noted: No Callus: Yes Atrophie Blanche: No Crepitus: No Cyanosis: No Excoriation: No Ecchymosis: No Induration: No Erythema: No Rash: No Hemosiderin Staining: No Scarring: No Mottled: No Pallor: No Moisture Rubor: No No Abnormalities Noted: No Dry / Scaly: No Maceration: No Wound Preparation Ulcer Cleansing: Rinsed/Irrigated with Saline Topical Anesthetic Applied: Other: lidocaine 4%, Treatment Notes Wound #2 (Left, Plantar Foot) 1. Cleansed  with: Clean wound with Normal Saline 5. Secondary Dressing Applied Foam Kerlix/Conform Electronic Signature(s) Signed: 09/03/2016 3:56:48 PM By: [name] Entered By: [name] on 09/03/2016 15:56:48 [place], [place] ([id number]) -------------------------------------------------------------------------------- Wound Assessment Details Patient Name: [name], [name] Date of Service: 09/03/2016 11:00 AM Medical Record Patient Account Number: [id number] Number: Treating RN: [name] Dec 24, 1945 (70 y.o. Other Clinician:  Date of Birth/Sex: Female) Treating [name] Primary Care Christien Frankl: [name] Evony Rezek/Extender: G Referring Glennys Schorsch: [name] in Treatment: 12 Wound Status Wound Number: 3 Primary Etiology: Pressure Ulcer Wound Location: Left Foot - Lateral Secondary Diabetic Wound/Ulcer of the Lower Etiology: Extremity Wounding Event: Blister Wound Status: Open Date Acquired: 06/03/2016 Comorbid Hypertension, Type II Diabetes Weeks Of Treatment: 12 History: Clustered Wound: No Photos Wound Measurements Length: (cm) 0.4 Width: (cm) 0.6 Depth: (cm) 0.2 Area: (cm) 0.188 Volume: (cm) 0.038 % Reduction in Area: 73.4% % Reduction in Volume: 46.5% Epithelialization: Large (67-100%) Tunneling: No Undermining: No Wound Description Classification: Category/Stage II Foul Odor A Diabetic Severity (Wagner): Grade 1 Wound Margin: Flat and Intact Exudate Amount: Large Exudate Type: Serous Exudate Color: amber fter Cleansing: No Wound Bed Granulation Amount: Large (67-100%) Exposed Structure Granulation Quality: Pink Fascia Exposed: No Leinweber, Nazyia L. ([id number]) Necrotic Amount: None Present (0%) Fat Layer (Subcutaneous Tissue) Exposed: No Tendon Exposed: No Muscle Exposed: No Joint Exposed: No Bone Exposed: No Limited to Skin Breakdown Periwound Skin Texture Texture Color No Abnormalities Noted: No No Abnormalities  Noted: No Callus: No Atrophie Blanche: No Crepitus: No Cyanosis: No Excoriation: No Ecchymosis: No Induration: No Erythema: No Rash: No Hemosiderin Staining: No Scarring: No Mottled: No Pallor: No Moisture Rubor: No No Abnormalities Noted: No Dry / Scaly: No Temperature / Pain Maceration: No Temperature: No Abnormality Wound Preparation Ulcer Cleansing: Rinsed/Irrigated with Saline Topical Anesthetic Applied: None Treatment Notes Wound #3 (Left, Lateral Foot) 1. Cleansed with: Clean wound with Normal Saline 2. Anesthetic Topical Lidocaine 4% cream to wound bed prior to debridement 4. Dressing Applied: Santyl Ointment 5. Secondary Dressing Applied Foam Gauze and Kerlix/Conform Electronic Signature(s) Signed: 09/03/2016 3:57:02 PM By: [name] Entered By: [name] on 09/03/2016 15:57:01 Alonzo, Naeema L. ([id number]) -------------------------------------------------------------------------------- Vitals Details Patient Name: Pomeroy, Malicia L. Date of Service: 09/03/2016 11:00 AM Medical Record Patient Account Number: [id number] Number: Treating RN: [name] 1945/08/12 (70 y.o. Other Clinician: Date of Birth/Sex: Female) Treating [name] Primary Care Candi Profit: [name] Shomari Scicchitano/Extender: G Referring Anna-Marie Coller: [name] in Treatment: 12 Vital Signs Time Taken: 11:13 Temperature (F): 98.3 Height (in): 62 Pulse (bpm): 86 Weight (lbs): 140 Respiratory Rate (breaths/min): 18 Body Mass Index (BMI): 25.6 Blood Pressure (mmHg): 190/76 Reference Range: 80 - 120 mg / dl Electronic Signature(s) Signed: 09/03/2016 4:59:35 PM By: [name] Entered By: [name] on 09/03/2016 11:13:52

## 2016-09-06 ENCOUNTER — Encounter: Payer: Medicare Other | Admitting: Physical Therapy

## 2016-09-06 LAB — AEROBIC CULTURE W GRAM STAIN (SUPERFICIAL SPECIMEN): Culture: NO GROWTH

## 2016-09-06 LAB — AEROBIC CULTURE  (SUPERFICIAL SPECIMEN)

## 2016-09-10 ENCOUNTER — Encounter: Payer: Medicare Other | Attending: Internal Medicine | Admitting: Internal Medicine

## 2016-09-10 DIAGNOSIS — E11621 Type 2 diabetes mellitus with foot ulcer: Secondary | ICD-10-CM | POA: Diagnosis present

## 2016-09-10 DIAGNOSIS — Z885 Allergy status to narcotic agent status: Secondary | ICD-10-CM | POA: Insufficient documentation

## 2016-09-10 DIAGNOSIS — L02612 Cutaneous abscess of left foot: Secondary | ICD-10-CM | POA: Insufficient documentation

## 2016-09-10 DIAGNOSIS — I10 Essential (primary) hypertension: Secondary | ICD-10-CM | POA: Insufficient documentation

## 2016-09-10 DIAGNOSIS — L97522 Non-pressure chronic ulcer of other part of left foot with fat layer exposed: Secondary | ICD-10-CM | POA: Diagnosis not present

## 2016-09-11 NOTE — Progress Notes (Signed)
[name], [name] ([id number]) Visit Report for 09/10/2016 Chief Complaint Document Details Patient Name: [name], [name] Date of Service: 09/10/2016 3:30 PM Medical Record Patient Account Number: [id number] Number: Treating RN: Montey Hora [birth year] (70 y.o. Other Clinician: Date of Birth/Sex: Female) Treating ROBSON, MICHAEL Primary Care Provider: Lanier Prude Provider/Extender: G Referring Provider: Brendia Sacks in Treatment: 13 Information Obtained from: Patient Chief Complaint 06/05/16; patient is here for review of a wound on her right foot which is been present for at least a month Electronic Signature(s) Signed: 09/11/2016 8:02:19 AM By: Linton Ham MD Entered By: Linton Ham on 09/10/2016 16:14:28 [place], [place] ([id number]) -------------------------------------------------------------------------------- Debridement Details Patient Name: [name], [name] Date of Service: 09/10/2016 3:30 PM Medical Record Patient Account Number: [id number] Number: Treating RN: Montey Hora Mar 22, 1946 (70 y.o. Other Clinician: Date of Birth/Sex: Female) Treating ROBSON, MICHAEL Primary Care Provider: Lanier Prude Provider/Extender: G Referring Provider: Brendia Sacks in Treatment: 13 Debridement Performed for Wound #3 Left,Lateral Foot Assessment: Performed By: Physician Ricard Dillon, MD Debridement: Open Wound/Selective Pre-procedure Yes - 15:52 Verification/Time Out Taken: Start Time: 15:52 Pain Control: Lidocaine 4% Topical Solution Level: Skin/Dermis Total Area Debrided (L x 0.2 (cm) x 0.4 (cm) = 0.08 (cm) W): Tissue and other Non-Viable, Callus, Skin material debrided: Instrument: Curette Bleeding: Minimum Hemostasis Achieved: Pressure End Time: 15:55 Procedural Pain: 0 Post Procedural Pain: 0 Response to Treatment: Procedure was tolerated well Post Debridement Measurements of Total Wound Length: (cm) 0.2 Stage:  Category/Stage II Width: (cm) 0.4 Depth: (cm) 0.2 Volume: (cm) 0.013 Character of Wound/Ulcer Post Improved Debridement: Severity of Tissue Post Fat layer exposed Debridement: Post Procedure Diagnosis Same as Pre-procedure Electronic Signature(s) Signed: 09/10/2016 4:54:24 PM By: Elvina Mattes, [name] ([id number]) Signed: 09/11/2016 8:02:19 AM By: Linton Ham MD Entered By: Linton Ham on 09/10/2016 16:12:43 [place], [place] ([id number]) -------------------------------------------------------------------------------- HPI Details Patient Name: [name], [name] Date of Service: 09/10/2016 3:30 PM Medical Record Patient Account Number: [id number] Number: Treating RN: Montey Hora May 29, 1946 (70 y.o. Other Clinician: Date of Birth/Sex: Female) Treating ROBSON, MICHAEL Primary Care Provider: Lanier Prude Provider/Extender: G Referring Provider: Brendia Sacks in Treatment: 13 History of Present Illness HPI Description: The patient is here for followup. Denies fevers, has been compliant with wearing Darco shoes. She states that her blood sugars are still in the low 200s. [name] is a 82F with a h/o DM who presents with an open ulcer on the plantar aspect of her L great toe. She has had it for close to 8 months. She has also had several debridements as well as courses of oral antibiotics. Currently, she denies fevers and has no pain. She has never had ulcers like this in the past. Her blood sugars are normally in the 170s. Foot xray was negative for osteomyelitis. HgbA1c was 10.3. READMISSION; 06/05/16; [name] is a 71 year old type II diabetic by description not well-controlled. She does not have known PAD or claudication. She has apparently some degree of neuropathy or she's been told that in the past. She has a history of a wound on the plantar left foot foot for which she was seen here in 2015 although I was not involved in her care nor were  any other current physicians working in our clinics. She was managed with standard dressings and a Darco forefoot offloading boot and she seems to have healed quite quickly. Apparently the patient noticed a callus/wound development a month ago although her daughter who is present states  it was longer than that. Indeed the patient seems to been followed for at least 3 clinic visits with her podiatrist Dr. Helane Gunther. On October 19 noted a necrotic ulcer on the plantar aspect of the left first metatarsal head. She was using Silvadene based dressings given Keflex on 10/9. I don't believe any cultures or x-rays were done. More importantly she doesn't seem to be adequately offloading this area in her footwear ABIs in this clinic were 0.88 bilaterally. 06/12/16; continued follow-up for a central wound over the left first metatarsal head. Once again today required an extensive debridement. She is using a Darco forefoot offloading boot. Her x-ray of the foot was negative for any bone changes. She has not yet had vascular studies. ABI in this clinic was 0.88 06/19/16; formal arterial studies for early January for her daughter. In general the major wound over the plantar left first metatarsal head looks better. Area on her lateral fifth metatarsal head looks like it's proceeding towards closure. She is wearing the Darco forefoot offloading sandal was some difficulty 06/26/16; formal arterial studies towboat for early January. The area on the plantar left first metatarsal head apparently measures 0.2 x 0.2 mm smaller. She has the same surface slough and some nonviable surrounding tissue which is debrided with a curet. This cleans up quite nicely. The area on her lateral fifth metatarsal head is still fully epithelialized [name], [name] ([id number]) 07/03/16; formal arterial studies booked for early January. The areas on the plantar left first metatarsal head smaller by about 0.1 mmo. His is not making  quite is much progress as I was like. She is using a Darco forefoot offload her 07/10/16; she had her arterial studies today by Dr. Gilda Crease. I do not have the formal report however patient's daughter states she has "small vessel disease" presumably this means that there is not a revascularizable element. 07/12/2016 -- the arterial studies were done in the lab of Dr. Kirke Corin, and he will be seeing the patient on 07/19/2016. the duplex examination showed diffuse plaque bilaterally with a 50-74% stenosis of the right SFA. Three- vessel runoff on the right two-vessel runoff on the left. The ABI on the right was 0.89 on the left was 0.95 and the TBI on the right was 0.52 and on the left was 0.36 07/17/16; her arterial studies are noted. She had a 50-74% stenosis of the right SFA three-vessel runoff on the right two-vessel runoff on the left. Her wound is on the left first metatarsal head. She had an appointment with Dr. Kirke Corin however that had to reschedule list. The patient is traveling over the weekend and therefore she requested not to have the total contact cast replaced. 08/12/2016 -- the patient has not been back to see [place] for the last 25 days and comes in with an abscess on the plantar aspect of her left foot and the area on the left lateral foot is also an open wound now. 08/20/16; the patient arrived last week with an abscess on the left first plantar head. This was fully excised by Dr. Meyer Russel. CNS done showed Staphylococcus Lugdunensis. This was not specifically plated against Augmentin although I think it probably should've worked. Her foot is a lot better. As I understand things this is a coag-negative Staphylococcus species however unlike other coag-negative staph this should be treated. The patient appears to be a lot better 08/28/16; she is completed the dicloxacillin. Area on the left first plantar metatarsal head is completely resolved and epithelialized. The  area on the lateral aspect of  her left fifth metatarsal head however is still problematic 09/03/16; the patient's area on her left first plantar metatarsal head remains closed. However the lateral fifth metatarsal head wound continues to be problematic. Her daughter shows me pictures from Saturday on his cell phone that show intense erythema around the wound. She had completed antibiotics last week however that was for culture over the plantar first metatarsal head. 09/08/16; the area on her left first met head remains closed. The area over the left fifth metatarsal head continues to be problematic. Erythema surrounding this last week is better [Augmentin] however culture I did was negative x-ray also negative for osteomyelitis. We switched this Santyl last week ABI 0.88 Electronic Signature(s) Signed: 09/11/2016 8:02:19 AM By: Baltazar Najjar MD Entered By: Baltazar Najjar on 09/10/2016 16:15:54 [place], [place] L. ([id number]) -------------------------------------------------------------------------------- Physical Exam Details Patient Name: [name], [name] Date of Service: 09/10/2016 3:30 PM Medical Record Patient Account Number: [id number] Number: Treating RN: Curtis Sites [birth year] (70 y.o. Other Clinician: Date of Birth/Sex: Female) Treating ROBSON, MICHAEL Primary Care Provider: Daphane Shepherd Provider/Extender: G Referring Provider: Wynona Canes in Treatment: 13 Constitutional Sitting or standing Blood Pressure is within target range for patient.. Pulse regular and within target range for patient.Marland Kitchen Respirations regular, non-labored and within target range.. Temperature is normal and within the target range for the patient.. Patient's appearance is neat and clean. Appears in no acute distress. Well nourished and well developed.. Eyes Conjunctivae clear. No discharge.Marland Kitchen Respiratory Respiratory effort is easy and symmetric bilaterally. Rate is normal at rest and on room air.. Cardiovascular Pedal  pulses palpable and strong bilaterally.. Lymphatic None palpable in the popliteal or inguinal area on the left. Psychiatric No evidence of depression, anxiety, or agitation. Calm, cooperative, and communicative. Appropriate interactions and affect.. Notes Wound exam; the area on the plantar first metatarsal head remains resolved however the area on the left fifth metatarsal head is still open. I remove nonviable skin and eschar from around the circumference of the wound. There is still undermining here. No palpable bone however the surface of the wound still doesn't look satisfactory. Will need to continue [employer]) Signed: 09/11/2016 8:02:19 AM By: Baltazar Najjar MD Entered By: Baltazar Najjar on 09/10/2016 16:17:22 [name], [name] Lias ([id number]) -------------------------------------------------------------------------------- Physician Orders Details Patient Name: [name], [name] Date of Service: 09/10/2016 3:30 PM Medical Record Patient Account Number: [id number] Number: Treating RN: Curtis Sites 1945-12-05 (70 y.o. Other Clinician: Date of Birth/Sex: Female) Treating ROBSON, MICHAEL Primary Care Provider: Daphane Shepherd Provider/Extender: G Referring Provider: Wynona Canes in Treatment: 92 Verbal / Phone Orders: No Diagnosis Coding Wound Cleansing Wound #2 Left,Plantar Foot o Clean wound with Normal Saline. Wound #3 Left,Lateral Foot o Clean wound with Normal Saline. Anesthetic Wound #2 Left,Plantar Foot o Topical Lidocaine 4% cream applied to wound bed prior to debridement Wound #3 Left,Lateral Foot o Topical Lidocaine 4% cream applied to wound bed prior to debridement Primary Wound Dressing Wound #2 Left,Plantar Foot o Foam Wound #3 Left,Lateral Foot o Santyl Ointment Secondary Dressing Wound #2 Left,Plantar Foot o ABD and Kerlix/Conform - secure lightly with coban o Foam Wound #3 Left,Lateral Foot o ABD and  Kerlix/Conform - secure lightly with coban o Foam Dressing Change Frequency Wound #2 Left,Plantar Foot o Change dressing every other day. Dolin[place], Dangela L. ([id number]) Wound #3 Left,Lateral Foot o Change dressing every other day. Follow-up Appointments Wound #2 Left,Plantar Foot o Return Appointment in 1 week. Wound #3  Left,Lateral Foot o Return Appointment in 1 week. Off-Loading Wound #2 Left,Plantar Foot o Other: - wide shoe Wound #3 Left,Lateral Foot o Other: - wide shoe Additional Orders / Instructions Wound #2 Left,Plantar Foot o Increase protein intake. Wound #3 Left,Lateral Foot o Increase protein intake. Electronic Signature(s) Signed: 09/10/2016 4:54:24 PM By: Montey Hora Signed: 09/11/2016 8:02:19 AM By: Linton Ham MD Entered By: Montey Hora on 09/10/2016 15:54:53 [place], Verlee L. ([id number]) -------------------------------------------------------------------------------- Problem List Details Patient Name: [name], [name] Date of Service: 09/10/2016 3:30 PM Medical Record Patient Account Number: [id number] Number: Treating RN: Montey Hora 1946-02-19 (70 y.o. 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 Date of Service: 09/10/2016 3:30 PM Medical Record Patient Account Number: [id number] Number: Treating RN: Montey Hora 08-May-1946 (70 y.o. Other Clinician: Date of Birth/Sex: Female) Treating ROBSON, MICHAEL Primary Care Provider: Lanier Prude Provider/Extender: G Referring Provider: Brendia Sacks in Treatment: 13 Subjective Chief Complaint Information obtained from Patient 06/05/16; patient is here for review of a wound on her right foot which is been present for at least a month History of Present Illness (HPI) The patient is here for followup. Denies fevers, has been compliant with wearing Darco shoes. She states that her blood sugars are still in the low 200s. Alizaya is a 60F with a h/o DM who presents with an open ulcer on the plantar aspect of her L great toe. She has had it for close to 8 months. She has also had several debridements as well as courses of oral antibiotics. Currently, she denies fevers and has no pain. She has never had ulcers like this in the past. Her blood sugars are normally in the 170s. Foot xray was negative for osteomyelitis. HgbA1c was 10.3. READMISSION; 06/05/16; Mrs. Lanius is a 70 year old type II diabetic by description not well-controlled. She does not have known PAD or claudication. She has apparently some degree of neuropathy or she's been told that in the past. She has a history of a wound on the plantar left foot foot for which she was seen here in 2015 although I was not involved in her care nor were any other current physicians working in our clinics. She was managed with standard dressings and a Darco forefoot offloading boot and she seems to have healed quite quickly. Apparently the patient noticed a callus/wound development a month ago although her daughter who is present states it was longer than that. Indeed the patient seems to been followed for at least 3 clinic visits with her  podiatrist Dr. Gardiner Barefoot. On October 19 noted a necrotic ulcer on the plantar aspect of the left first metatarsal head. She was using Silvadene based dressings given Keflex on 10/9. I don't believe any cultures or x-rays were done. More importantly she doesn't seem to be adequately offloading this area in her footwear ABIs in this clinic were 0.88 bilaterally. 06/12/16; continued follow-up for a central wound over the left first metatarsal  head. Once again today required an extensive debridement. She is using a Darco forefoot offloading boot. Her x-ray of the foot was negative for any bone changes. She has not yet had vascular studies. ABI in this clinic was 0.88 06/19/16; formal arterial studies for early January for her daughter. In general the major wound over the Flannigan, Glorene L. ([id number]) plantar left first metatarsal head looks better. Area on her lateral fifth metatarsal head looks like it's proceeding towards closure. She is wearing the Darco forefoot offloading sandal was some difficulty 06/26/16; formal arterial studies towboat for early January. The area on the plantar left first metatarsal head apparently measures 0.2 x 0.2 mm smaller. She has the same surface slough and some nonviable surrounding tissue which is debrided with a curet. This cleans up quite nicely. The area on her lateral fifth metatarsal head is still fully epithelialized 07/03/16; formal arterial studies booked for early January. The areas on the plantar left first metatarsal head smaller by about 0.1 mm. His is not making quite is much progress as I was like. She is using a Darco forefoot offload her 07/10/16; she had her arterial studies today by Dr. Gilda Crease. I do not have the formal report however patient's daughter states she has "small vessel disease" presumably this means that there is not a revascularizable element. 07/12/2016 -- the arterial studies were done in the lab of Dr. Kirke Corin, and he will be  seeing the patient on 07/19/2016. the duplex examination showed diffuse plaque bilaterally with a 50-74% stenosis of the right SFA. Three- vessel runoff on the right two-vessel runoff on the left. The ABI on the right was 0.89 on the left was 0.95 and the TBI on the right was 0.52 and on the left was 0.36 07/17/16; her arterial studies are noted. She had a 50-74% stenosis of the right SFA three-vessel runoff on the right two-vessel runoff on the left. Her wound is on the left first metatarsal head. She had an appointment with Dr. Kirke Corin however that had to reschedule list. The patient is traveling over the weekend and therefore she requested not to have the total contact cast replaced. 08/12/2016 -- the patient has not been back to see [place] for the last 25 days and comes in with an abscess on the plantar aspect of her left foot and the area on the left lateral foot is also an open wound now. 08/20/16; the patient arrived last week with an abscess on the left first plantar head. This was fully excised by Dr. Meyer Russel. CNS done showed Staphylococcus Lugdunensis. This was not specifically plated against Augmentin although I think it probably should've worked. Her foot is a lot better. As I understand things this is a coag-negative Staphylococcus species however unlike other coag-negative staph this should be treated. The patient appears to be a lot better 08/28/16; she is completed the dicloxacillin. Area on the left first plantar metatarsal head is completely resolved and epithelialized. The area on the lateral aspect of her left fifth metatarsal head however is still problematic 09/03/16; the patient's area on her left first plantar metatarsal head remains closed. However the lateral fifth metatarsal head wound continues to be problematic. Her daughter shows me pictures from Saturday on his cell phone that show intense erythema around the wound. She had completed antibiotics last week however that was for  culture over the plantar first metatarsal head. 09/08/16; the area on her left first met head remains closed. The area over the left fifth metatarsal  head continues to be problematic. Erythema surrounding this last week is better [Augmentin] however culture I did was negative x-ray also negative for osteomyelitis. We switched this Santyl last week ABI 0.88 Objective Constitutional Sitting or standing Blood Pressure is within target range for patient.. Pulse regular and within target range [name], [name] ([id number]) for patient.Marland Kitchen Respirations regular, non-labored and within target range.. Temperature is normal and within the target range for the patient.. Patient's appearance is neat and clean. Appears in no acute distress. Well nourished and well developed.. Vitals Time Taken: 3:36 PM, Height: 62 in, Weight: 140 lbs, BMI: 25.6, Temperature: 98.1 F, Pulse: 89 bpm, Respiratory Rate: 18 breaths/min, Blood Pressure: 201/75 mmHg. Eyes Conjunctivae clear. No discharge.Marland Kitchen Respiratory Respiratory effort is easy and symmetric bilaterally. Rate is normal at rest and on room air.. Cardiovascular Pedal pulses palpable and strong bilaterally.. Lymphatic None palpable in the popliteal or inguinal area on the left. Psychiatric No evidence of depression, anxiety, or agitation. Calm, cooperative, and communicative. Appropriate interactions and affect.. General Notes: Wound exam; the area on the plantar first metatarsal head remains resolved however the area on the left fifth metatarsal head is still open. I remove nonviable skin and eschar from around the circumference of the wound. There is still undermining here. No palpable bone however the surface of the wound still doesn't look satisfactory. Will need to continue Santyl Integumentary (Hair, Skin) Wound #2 status is Open. Original cause of wound was Gradually Appeared. The wound is located on the [place]. The wound measures 0.1cm length  x 0.1cm width x 0.1cm depth; 0.008cm^2 area and 0.001cm^3 volume. The wound is limited to skin breakdown. There is no tunneling or undermining noted. There is a large amount of serous drainage noted. The wound margin is flat and intact. There is large (67- 100%) red granulation within the wound bed. There is no necrotic tissue within the wound bed. The periwound skin appearance exhibited: Callus. The periwound skin appearance did not exhibit: Crepitus, Excoriation, Induration, Rash, Scarring, Dry/Scaly, Maceration, Atrophie Blanche, Cyanosis, Ecchymosis, Hemosiderin Staining, Mottled, Pallor, Rubor, Erythema. Wound #3 status is Open. Original cause of wound was Blister. The wound is located on the Left,Lateral Foot. The wound measures 0.2cm length x 0.4cm width x 0.2cm depth; 0.063cm^2 area and 0.013cm^3 volume. The wound is limited to skin breakdown. There is no tunneling or undermining noted. There is a large amount of serous drainage noted. The wound margin is flat and intact. There is large (67-100%) pink granulation within the wound bed. There is no necrotic tissue within the wound bed. The periwound skin appearance did not exhibit: Callus, Crepitus, Excoriation, Induration, Rash, Scarring, Dry/Scaly, Maceration, Atrophie Blanche, Cyanosis, Ecchymosis, Hemosiderin Staining, Mottled, Pallor, Rubor, Erythema. Periwound temperature was noted as No Abnormality. [name], [name] ([id number]) Assessment Active Problems ICD-10 E11.621 - Type 2 diabetes mellitus with foot ulcer L97.522 - Non-pressure chronic ulcer of other part of left foot with fat layer exposed L02.612 - Cutaneous abscess of left foot Procedures Wound #3 Wound #3 is a Pressure Ulcer located on the Left,Lateral Foot . There was a Skin/Dermis Open Wound/Selective [phone]) debridement with total area of 0.08 sq cm performed by Ricard Dillon, MD. with the following instrument(s): Curette to remove Non-Viable  tissue/material including Skin and Callus after achieving pain control using Lidocaine 4% Topical Solution. A time out was conducted at 15:52, prior to the start of the procedure. A Minimum amount of bleeding was controlled with Pressure. The procedure was tolerated well with  a pain level of 0 throughout and a pain level of 0 following the procedure. Post Debridement Measurements: 0.2cm length x 0.4cm width x 0.2cm depth; 0.013cm^3 volume. Post debridement Stage noted as Category/Stage II. Character of Wound/Ulcer Post Debridement is improved. Severity of Tissue Post Debridement is: Fat layer exposed. Post procedure Diagnosis Wound #3: Same as Pre-Procedure Plan Wound Cleansing: Wound #2 Left,Plantar Foot: Clean wound with Normal Saline. Wound #3 Left,Lateral Foot: Clean wound with Normal Saline. Anesthetic: Wound #2 Left,Plantar Foot: Topical Lidocaine 4% cream applied to wound bed prior to debridement Wound #3 Left,Lateral Foot: Topical Lidocaine 4% cream applied to wound bed prior to debridement Primary Wound Dressing: [name], [name] ([id number]) Wound #2 Left,Plantar Foot: Foam Wound #3 Left,Lateral Foot: Santyl Ointment Secondary Dressing: Wound #2 Left,Plantar Foot: ABD and Kerlix/Conform - secure lightly with coban Foam Wound #3 Left,Lateral Foot: ABD and Kerlix/Conform - secure lightly with coban Foam Dressing Change Frequency: Wound #2 Left,Plantar Foot: Change dressing every other day. Wound #3 Left,Lateral Foot: Change dressing every other day. Follow-up Appointments: Wound #2 Left,Plantar Foot: Return Appointment in 1 week. Wound #3 Left,Lateral Foot: Return Appointment in 1 week. Off-Loading: Wound #2 Left,Plantar Foot: Other: - wide shoe Wound #3 Left,Lateral Foot: Other: - wide shoe Additional Orders / Instructions: Wound #2 Left,Plantar Foot: Increase protein intake. Wound #3 Left,Lateral Foot: Increase protein intake. continue santyl finish  antibiotics I cannot convince her to use our healing sandal, using a standard shoe Electronic Signature(s) Signed: 09/11/2016 8:02:19 AM By: Linton Ham MD Entered By: Linton Ham on 09/10/2016 16:18:27 [place], [place] ([id number]) -------------------------------------------------------------------------------- SuperBill Details Patient Name: [name], [name] Date of Service: 09/10/2016 Medical Record Patient Account Number: [id number] Number: Treating RN: Montey Hora [birth year] (70 y.o. Other Clinician: Date of Birth/Sex: Female) Treating ROBSON, MICHAEL Primary Care Provider: Lanier Prude Provider/Extender: G Referring Provider: Lanier Prude Service Line: Outpatient Weeks in Treatment: 13 Diagnosis Coding ICD-10 Codes Code Description E11.621 Type 2 diabetes mellitus with foot ulcer L97.522 Non-pressure chronic ulcer of other part of left foot with fat layer exposed L02.612 Cutaneous abscess of left foot Facility Procedures CPT4 Code Description: [id number] - DEBRIDE WOUND 1ST 20 SQ CM OR < ICD-10 Description Diagnosis E11.621 Type 2 diabetes mellitus with foot ulcer L97.522 Non-pressure chronic ulcer of other part of left foot Modifier: with fat lay Quantity: 1 er exposed Physician Procedures CPT4 Code Description: [id number] - WC PHYS DEBR WO ANESTH 20 SQ CM ICD-10 Description Diagnosis E11.621 Type 2 diabetes mellitus with foot ulcer L97.522 Non-pressure chronic ulcer of other part of left foot Modifier: with fat laye Quantity: 1 r exposed Electronic Signature(s) Signed: 09/11/2016 8:02:19 AM By: Linton Ham MD Entered By: Linton Ham on 09/10/2016 16:18:51

## 2016-09-11 NOTE — Progress Notes (Addendum)
HEIDE, BROSSART ([id number]) Visit Report for 09/10/2016 Arrival Information Details Patient Name: [name], [name] Date of Service: 09/10/2016 3:30 PM Medical Record Patient Account Number: [id number] Number: Treating RN: Montey Hora July 25, 1945 (70 y.o. Other Clinician: Date of Birth/Sex: Female) Treating ROBSON, MICHAEL Primary Care Deane Wattenbarger: Lanier Prude Lanny Lipkin/Extender: G Referring [place]: Brendia Sacks in Treatment: 13 Visit Information History Since Last Visit Added or deleted any medications: No Patient Arrived: Ambulatory Any new allergies or adverse reactions: No Arrival Time: 15:33 Had a fall or experienced change in No Accompanied By: self activities of daily living that may affect Transfer Assistance: None risk of falls: Patient Identification Verified: Yes Signs or symptoms of abuse/neglect since last No Secondary Verification Process Yes visito Completed: Hospitalized since last visit: No Patient Has Alerts: Yes Has Dressing in Place as Prescribed: Yes Patient Alerts: DMII Pain Present Now: No Electronic Signature(s) Signed: 09/10/2016 4:54:24 PM By: Montey Hora Entered By: Montey Hora on 09/10/2016 15:33:48 [name], [name] ([id number]) -------------------------------------------------------------------------------- Encounter Discharge Information Details Patient Name: [name], [name] Date of Service: 09/10/2016 3:30 PM Medical Record Patient Account Number: [id number] Number: Treating RN: Montey Hora 07/06/1946 (70 y.o. Other Clinician: Date of Birth/Sex: Female) Treating ROBSON, MICHAEL Primary Care Carlen Fils: Lanier Prude Anastassia Noack/Extender: G Referring Kaydince Towles: Brendia Sacks in Treatment: 67 Encounter Discharge Information Items Discharge Pain Level: 0 Discharge Condition: Stable Ambulatory Status: Ambulatory Discharge Destination: Home Transportation: Private Auto Accompanied By: self Schedule  Follow-up Appointment: Yes Medication Reconciliation completed and provided to Patient/Care No Jabir Dahlem: Provided on Clinical Summary of Care: 09/10/2016 Form Type Recipient Paper Patient BR Electronic Signature(s) Signed: 09/10/2016 4:07:52 PM By: Ruthine Dose Entered By: Ruthine Dose on 09/10/2016 Albright, Movico. ([id number]) -------------------------------------------------------------------------------- Multi Wound Chart Details Patient Name: [name], [name] Date of Service: 09/10/2016 3:30 PM Medical Record Patient Account Number: [id number] Number: Treating RN: Montey Hora 1945-12-02 (70 y.o. Other Clinician: Date of Birth/Sex: Female) Treating ROBSON, MICHAEL Primary Care Quantavia Frith: Lanier Prude Beckem Tomberlin/Extender: G Referring Keayra Graham: Brendia Sacks in Treatment: 13 Vital Signs Height(in): 62 Pulse(bpm): 89 Weight(lbs): 140 Blood Pressure 201/75 (mmHg): Body Mass Index(BMI): 26 Temperature(F): 98.1 Respiratory Rate 18 (breaths/min): Photos: [N/A:N/A] Wound Location: Left Foot - Plantar Left Foot - Lateral N/A Wounding Event: Gradually Appeared Blister N/A Primary Etiology: Diabetic Wound/Ulcer of Pressure Ulcer N/A the Lower Extremity Secondary Etiology: Pressure Ulcer Diabetic Wound/Ulcer of N/A the Lower Extremity Comorbid History: Hypertension, Type II Hypertension, Type II N/A Diabetes Diabetes Date Acquired: 03/25/2016 06/03/2016 N/A Weeks of Treatment: 13 13 N/A Wound Status: Open Open N/A Measurements L x W x D 0.1x0.1x0.1 0.2x0.4x0.2 N/A (cm) Area (cm) : 0.008 0.063 N/A Volume (cm) : 0.001 0.013 N/A % Reduction in Area: 98.50% 91.10% N/A % Reduction in Volume: 99.60% 81.70% N/A Classification: Grade 2 Category/Stage II N/A HBO Classification: N/A Grade 1 N/A Exudate Amount: Large Large N/A Exudate Type: Serous Serous N/A [name], [name] ([id number]) Exudate Color: amber amber N/A Wound Margin: Flat and Intact  Flat and Intact N/A Granulation Amount: Large (67-100%) Large (67-100%) N/A Granulation Quality: Red Pink N/A Necrotic Amount: None Present (0%) None Present (0%) N/A Exposed Structures: Fascia: No Fascia: No N/A Fat Layer (Subcutaneous Fat Layer (Subcutaneous Tissue) Exposed: No Tissue) Exposed: No Tendon: No Tendon: No Muscle: No Muscle: No Joint: No Joint: No Bone: No Bone: No Limited to Skin Limited to Skin Breakdown Breakdown Epithelialization: Small (1-33%) Large (67-100%) N/A Debridement: N/A Open Wound/Selective N/A ([id number]) Pre-procedure N/A 15:52 N/A Verification/Time Out  Taken: Pain Control: N/A Lidocaine 4% Topical N/A Solution Tissue Debrided: N/A Skin, Callus N/A Level: N/A Skin/Dermis N/A Debridement Area (sq N/A 0.08 N/A cm): Instrument: N/A Curette N/A Bleeding: N/A Minimum N/A Hemostasis Achieved: N/A Pressure N/A Procedural Pain: N/A 0 N/A Post Procedural Pain: N/A 0 N/A Debridement Treatment N/A Procedure was tolerated N/A Response: well Post Debridement N/A 0.2x0.4x0.2 N/A Measurements L x W x D (cm) Post Debridement N/A 0.013 N/A Volume: (cm) Post Debridement N/A Category/Stage II N/A Stage: Periwound Skin Texture: Callus: Yes Excoriation: No N/A Excoriation: No Induration: No Induration: No Callus: No Crepitus: No Crepitus: No Rash: No Rash: No Scarring: No Scarring: No Periwound Skin Maceration: No Maceration: No N/A Moisture: Dry/Scaly: No Dry/Scaly: No Periwound Skin Color: Atrophie Blanche: No Atrophie Blanche: No N/A Cyanosis: No Cyanosis: No [name], [name] ([id number]) Ecchymosis: No Ecchymosis: No Erythema: No Erythema: No Hemosiderin Staining: No Hemosiderin Staining: No Mottled: No Mottled: No Pallor: No Pallor: No Rubor: No Rubor: No Temperature: N/A No Abnormality N/A Tenderness on No No N/A Palpation: Wound Preparation: Ulcer Cleansing: Ulcer Cleansing: N/A Rinsed/Irrigated with  Rinsed/Irrigated with Saline Saline Topical Anesthetic Topical Anesthetic Applied: Other: lidocaine Applied: None 4% Procedures Performed: N/A Debridement N/A Treatment Notes Wound #2 (Left, Plantar Foot) 1. Cleansed with: Clean wound with Normal Saline 4. Dressing Applied: Foam 5. Secondary Dressing Applied Kerlix/Conform Wound #3 (Left, Lateral Foot) 1. Cleansed with: Clean wound with Normal Saline 2. Anesthetic Topical Lidocaine 4% cream to wound bed prior to debridement 4. Dressing Applied: Santyl Ointment 5. Secondary Dressing Applied Foam Gauze and Kerlix/Conform 7. Secured with Recruitment consultant) Signed: 09/11/2016 8:02:19 AM By: Linton Ham MD Entered By: Linton Ham on 09/10/2016 16:14:19 Vickery, [name] ([id number]) -------------------------------------------------------------------------------- [place] Details Patient Name: Shen, Elina L. Date of Service: 09/10/2016 3:30 PM Medical Record Patient Account Number: [id number] Number: Treating RN: Montey Hora [birth year] (70 y.o. Other Clinician: Date of Birth/Sex: Female) Treating ROBSON, MICHAEL Primary Care Lorena Clearman: Lanier Prude Achille Xiang/Extender: G Referring Olaoluwa Grieder: Brendia Sacks in Treatment: 13 Active Inactive Electronic Signature(s) Signed: 10/01/2016 3:26:38 PM By: Gretta Cool RN, BSN, Kim RN, BSN Signed: 10/17/2016 4:56:22 PM By: Montey Hora Previous Signature: 09/10/2016 4:54:24 PM Version By: Montey Hora Entered By: Gretta Cool RN, BSN, Kim on 10/01/2016 15:26:37 [name], [name] ([id number]) -------------------------------------------------------------------------------- Pain Assessment Details Patient Name: [name], [name] Date of Service: 09/10/2016 3:30 PM Medical Record Patient Account Number: [id number] Number: Treating RN: Montey Hora 1946/05/18 (70 y.o. Other Clinician: Date of Birth/Sex: Female) Treating ROBSON,  MICHAEL Primary Care Jack Mineau: Lanier Prude Tamia Dial/Extender: G Referring Lucresha Dismuke: Brendia Sacks in Treatment: 13 Active Problems Location of Pain Severity and Description of Pain Patient Has Paino No Site Locations Pain Management and Medication Current Pain Management: Notes Topical or injectable lidocaine is offered to patient for acute pain when surgical debridement is performed. If needed, Patient is instructed to use over the counter pain medication for the following 24-48 hours after debridement. Wound care MDs do not prescribed pain medications. Patient has chronic pain or uncontrolled pain. Patient has been instructed to make an appointment with their Primary Care Physician for pain management. Electronic Signature(s) Signed: 09/10/2016 4:54:24 PM By: Montey Hora Entered By: Montey Hora on 09/10/2016 15:35:57 [name], [name] ([id number]) -------------------------------------------------------------------------------- Patient/Caregiver Education Details Patient Name: [name], [name] Date of Service: 09/10/2016 3:30 PM Medical Record Patient Account Number: [id number] Number: Treating RN: Montey Hora 11/20/1945 (70 y.o. Other Clinician: Date of Birth/Gender: Female) Treating ROBSON, MICHAEL Primary  Care Physician/Extender: Ashok Croon Physician: Suella [place] in Treatment: 13 Referring Physician: Lanier Prude Education Assessment Education Provided To: Patient Education Topics Provided Wound/Skin Impairment: Handouts: Other: wound care as ordered Methods: Demonstration, Explain/Verbal Responses: State content correctly Electronic Signature(s) Signed: 09/10/2016 4:54:24 PM By: Montey Hora Entered By: Montey Hora on 09/10/2016 16:36:55 [name], [name] ([id number]) -------------------------------------------------------------------------------- Wound Assessment Details Patient Name: [name], [name] Date of Service: 09/10/2016 3:30  PM Medical Record Patient Account Number: [id number] Number: Treating RN: Montey Hora 1945-11-23 (70 y.o. Other Clinician: Date of Birth/Sex: Female) Treating ROBSON, MICHAEL Primary Care Christion Leonhard: Lanier Prude Carmel Garfield/Extender: G Referring Kareem Cathey: Brendia Sacks in Treatment: 13 Wound Status Wound Number: 2 Primary Etiology: Diabetic Wound/Ulcer of the Lower Extremity Wound Location: Left Foot - Plantar Secondary Pressure Ulcer Wounding Event: Gradually Appeared Etiology: Date Acquired: 03/25/2016 Wound Status: Open Weeks Of Treatment: 13 Comorbid Hypertension, Type II Diabetes Clustered Wound: No History: Photos Wound Measurements Length: (cm) 0.1 % Reduction in Area: Width: (cm) 0.1 % Reduction in Volum Depth: (cm) 0.1 Epithelialization: Area: (cm) 0.008 Tunneling: Volume: (cm) 0.001 Undermining: 98.5% e: 99.6% Small (1-33%) No No Wound Description Classification: Grade 2 Foul Odor After Clea Wound Margin: Flat and Intact Slough/Fibrino Exudate Amount: Large Exudate Type: Serous Exudate Color: amber nsing: No No Wound Bed Granulation Amount: Large (67-100%) Exposed Structure Granulation Quality: Red Fascia Exposed: No Necrotic Amount: None Present (0%) Fat Layer (Subcutaneous Tissue) Exposed: No Rottman, Jermisha L. ([id number]) Tendon Exposed: No Muscle Exposed: No Joint Exposed: No Bone Exposed: No Limited to Skin Breakdown Periwound Skin Texture Texture Color No Abnormalities Noted: No No Abnormalities Noted: No Callus: Yes Atrophie Blanche: No Crepitus: No Cyanosis: No Excoriation: No Ecchymosis: No Induration: No Erythema: No Rash: No Hemosiderin Staining: No Scarring: No Mottled: No Pallor: No Moisture Rubor: No No Abnormalities Noted: No Dry / Scaly: No Maceration: No Wound Preparation Ulcer Cleansing: Rinsed/Irrigated with Saline Topical Anesthetic Applied: Other: lidocaine 4%, Electronic  Signature(s) Signed: 09/10/2016 4:54:24 PM By: Montey Hora Entered By: Montey Hora on 09/10/2016 15:50:41 [place], [place] ([id number]) -------------------------------------------------------------------------------- Wound Assessment Details Patient Name: [name], [name] Date of Service: 09/10/2016 3:30 PM Medical Record Patient Account Number: [id number] Number: Treating RN: Montey Hora Jun 06, 1946 (70 y.o. Other Clinician: Date of Birth/Sex: Female) Treating ROBSON, [place] Primary Care Zauria Dombek: Lanier Prude Georgiann Neider/Extender: G Referring Thomson Herbers: Brendia Sacks in Treatment: 13 Wound Status Wound Number: 3 Primary Etiology: Pressure Ulcer Wound Location: Left Foot - Lateral Secondary Diabetic Wound/Ulcer of the Lower Etiology: Extremity Wounding Event: Blister Wound Status: Open Date Acquired: 06/03/2016 Comorbid Hypertension, Type II Diabetes Weeks Of Treatment: 13 History: Clustered Wound: No Photos Wound Measurements Length: (cm) 0.2 Width: (cm) 0.4 Depth: (cm) 0.2 Area: (cm) 0.063 Volume: (cm) 0.013 % Reduction in Area: 91.1% % Reduction in Volume: 81.7% Epithelialization: Large (67-100%) Tunneling: No Undermining: No Wound Description Classification: Category/Stage II Foul Odor A Diabetic Severity (Wagner): Grade 1 Wound Margin: Flat and Intact Exudate Amount: Large Exudate Type: Serous Exudate Color: amber fter Cleansing: No Wound Bed Granulation Amount: Large (67-100%) Exposed Structure Granulation Quality: Pink Fascia Exposed: No [name], Deniesha L. ([id number]) Necrotic Amount: None Present (0%) Fat Layer (Subcutaneous Tissue) Exposed: No Tendon Exposed: No Muscle Exposed: No Joint Exposed: No Bone Exposed: No Limited to Skin Breakdown Periwound Skin Texture Texture Color No Abnormalities Noted: No No Abnormalities Noted: No Callus: No Atrophie Blanche: No Crepitus: No Cyanosis: No Excoriation: No Ecchymosis:  No Induration: No Erythema: No Rash: No Hemosiderin Staining: No  Scarring: No Mottled: No Pallor: No Moisture Rubor: No No Abnormalities Noted: No Dry / Scaly: No Temperature / Pain Maceration: No Temperature: No Abnormality Wound Preparation Ulcer Cleansing: Rinsed/Irrigated with Saline Topical Anesthetic Applied: None Electronic Signature(s) Signed: 09/10/2016 4:54:24 PM By: Montey Hora Entered By: Montey Hora on 09/10/2016 15:51:07 Uriostegui, Jazaria L. ([id number]) -------------------------------------------------------------------------------- Vitals Details Patient Name: Beaupre, Shakera L. Date of Service: 09/10/2016 3:30 PM Medical Record Patient Account Number: [id number] Number: Treating RN: Montey Hora 31-Jan-1946 (70 y.o. Other Clinician: Date of Birth/Sex: Female) Treating ROBSON, MICHAEL Primary Care Griffin Gerrard: Lanier Prude Meiya Wisler/Extender: G Referring Thecla Forgione: Brendia Sacks in Treatment: 13 Vital Signs Time Taken: 15:36 Temperature (F): 98.1 Height (in): 62 Pulse (bpm): 89 Weight (lbs): 140 Respiratory Rate (breaths/min): 18 Body Mass Index (BMI): 25.6 Blood Pressure (mmHg): 201/75 Reference Range: 80 - 120 mg / dl Electronic Signature(s) Signed: 09/10/2016 4:54:24 PM By: Montey Hora Entered By: Montey Hora on 09/10/2016 15:36:45

## 2016-09-17 ENCOUNTER — Ambulatory Visit: Payer: Medicare Other | Admitting: Internal Medicine

## 2016-09-20 ENCOUNTER — Encounter: Payer: Medicare Other | Admitting: Physical Therapy

## 2016-10-04 ENCOUNTER — Encounter: Payer: Medicare Other | Admitting: Physical Therapy

## 2017-07-08 DIAGNOSIS — I639 Cerebral infarction, unspecified: Secondary | ICD-10-CM

## 2017-07-08 HISTORY — DX: Cerebral infarction, unspecified: I63.9

## 2017-09-02 ENCOUNTER — Ambulatory Visit (INDEPENDENT_AMBULATORY_CARE_PROVIDER_SITE_OTHER): Payer: Medicare Other

## 2017-09-02 ENCOUNTER — Encounter: Payer: Self-pay | Admitting: Family Medicine

## 2017-09-02 ENCOUNTER — Ambulatory Visit: Payer: Medicare Other | Admitting: Family Medicine

## 2017-09-02 DIAGNOSIS — M25562 Pain in left knee: Secondary | ICD-10-CM | POA: Diagnosis not present

## 2017-09-02 DIAGNOSIS — R531 Weakness: Secondary | ICD-10-CM | POA: Diagnosis not present

## 2017-09-02 DIAGNOSIS — W19XXXA Unspecified fall, initial encounter: Secondary | ICD-10-CM | POA: Diagnosis not present

## 2017-09-02 DIAGNOSIS — M25561 Pain in right knee: Secondary | ICD-10-CM | POA: Diagnosis not present

## 2017-09-02 DIAGNOSIS — Y92009 Unspecified place in unspecified non-institutional (private) residence as the place of occurrence of the external cause: Secondary | ICD-10-CM

## 2017-09-02 DIAGNOSIS — E119 Type 2 diabetes mellitus without complications: Secondary | ICD-10-CM | POA: Diagnosis not present

## 2017-09-02 DIAGNOSIS — I10 Essential (primary) hypertension: Secondary | ICD-10-CM | POA: Diagnosis not present

## 2017-09-02 LAB — TSH: TSH: 3.93 u[IU]/mL (ref 0.35–4.50)

## 2017-09-02 LAB — COMPREHENSIVE METABOLIC PANEL
ALK PHOS: 91 U/L (ref 39–117)
ALT: 13 U/L (ref 0–35)
AST: 11 U/L (ref 0–37)
Albumin: 3.7 g/dL (ref 3.5–5.2)
BILIRUBIN TOTAL: 0.5 mg/dL (ref 0.2–1.2)
BUN: 28 mg/dL — AB (ref 6–23)
CO2: 23 meq/L (ref 19–32)
Calcium: 9.5 mg/dL (ref 8.4–10.5)
Chloride: 105 mEq/L (ref 96–112)
Creatinine, Ser: 1.68 mg/dL — ABNORMAL HIGH (ref 0.40–1.20)
GFR: 31.81 mL/min — ABNORMAL LOW (ref >60.00)
GLUCOSE: 154 mg/dL — AB (ref 70–99)
Potassium: 4 mEq/L (ref 3.5–5.1)
SODIUM: 138 meq/L (ref 135–145)
TOTAL PROTEIN: 6.9 g/dL (ref 6.0–8.3)

## 2017-09-02 LAB — CBC WITH DIFFERENTIAL/PLATELET
Basophils Absolute: 0.1 10*3/uL (ref 0.0–0.1)
Basophils Relative: 1 % (ref 0.0–3.0)
EOS PCT: 2.6 % (ref 0.0–5.0)
Eosinophils Absolute: 0.3 10*3/uL (ref 0.0–0.7)
HEMATOCRIT: 37.2 % (ref 36.0–46.0)
Hemoglobin: 12.5 g/dL (ref 12.0–15.0)
LYMPHS PCT: 18.4 % (ref 12.0–46.0)
Lymphs Abs: 1.9 10*3/uL (ref 0.7–4.0)
MCHC: 33.6 g/dL (ref 30.0–36.0)
MCV: 84.4 fl (ref 78.0–100.0)
MONOS PCT: 8.6 % (ref 3.0–12.0)
Monocytes Absolute: 0.9 10*3/uL (ref 0.1–1.0)
NEUTROS ABS: 7.3 10*3/uL (ref 1.4–7.7)
Neutrophils Relative %: 69.4 % (ref 43.0–77.0)
Platelets: 272 10*3/uL (ref 150.0–400.0)
RBC: 4.41 Mil/uL (ref 3.87–5.11)
RDW: 14.4 % (ref 11.5–15.5)
WBC: 10.6 10*3/uL — ABNORMAL HIGH (ref 4.0–10.5)

## 2017-09-02 LAB — VITAMIN D 25 HYDROXY (VIT D DEFICIENCY, FRACTURES): VITD: 20.88 ng/mL — ABNORMAL LOW (ref 30.00–100.00)

## 2017-09-02 LAB — VITAMIN B12: Vitamin B-12: 358 pg/mL (ref 211–911)

## 2017-09-02 LAB — HEMOGLOBIN A1C: Hgb A1c MFr Bld: 7.3 % — ABNORMAL HIGH (ref 4.6–6.5)

## 2017-09-02 LAB — LDL CHOLESTEROL, DIRECT: Direct LDL: 138 mg/dL

## 2017-09-02 MED ORDER — DICLOFENAC SODIUM 1 % TD GEL: 2.0000 g | Freq: Four times a day (QID) | TRANSDERMAL | 0 refills | Status: DC

## 2017-09-02 NOTE — Patient Instructions (Addendum)
Great to meet you.  See if you can find out why your Lostartan (or cozaar) was stopped and if you have ever tried lisinopril.  I will call you with your lab results from today and you can view them online.   We are starting voltaren gel.  Please keep me updated.

## 2017-09-02 NOTE — Assessment & Plan Note (Signed)
No injury.  ? Due to knee pain when she stood up.

## 2017-09-02 NOTE — Assessment & Plan Note (Signed)
Labs today. The patient indicates understanding of these issues and agrees with the plan.

## 2017-09-02 NOTE — Assessment & Plan Note (Signed)
H/o white coat HTN. Reasonable control at home. See questions about ARB/ACE under DM.

## 2017-09-02 NOTE — Assessment & Plan Note (Signed)
Continue low dose Metformin. Check a1c today, lipid panel. Asked daughter to look into why she is not on ARB anymore or if she has ever taken an ACEI. Awaiting records.

## 2017-09-02 NOTE — Assessment & Plan Note (Signed)
Probable OA. Start topical voltaren. Xray of left knee.

## 2017-09-02 NOTE — Progress Notes (Signed)
Subjective:   Patient ID: [name], female    DOB: [birth year], 72 y.o.   MRN: [id number]  [name] is a pleasant 72 y.o. year old female who presents to clinic today with New Patient (Initial Visit) (Patient is here today to establish care. She is not currently fasting.  Every BP med tried in the past prior to Amlodipine caused swelling.  Just rec Rx for an increase to 5mg  which has not been initiated as of yet.  BP this am was 140/65. Records are being requested.) and Fall (Patient also states that she fell on 2.24.19 and caught herself by her knees.  Daughter states that she has C/O bilateral knee weakness x3wks now.  Pt states that it is painful.)  on 09/02/2017  HPI:  HTN- h/o white coat HTN. Here with her daughter, [name] who checks her BP regularly and states that on amlodipine 2.5 mg daily, never higher than 140/60s.   In reviewing her chart, appears she was on ARB in past.  She is not sure why this was stopped.  Awaiting records. She states that she does have an allergy to clonidine- tongue swelling.  Fall- fell on 08/31/17.  Caught herself on her knees.Did not hit her head. She has complained of bilateral knee pain for 3 weeks now.  This is an intermittent issue.  Tries not to take oral NSAIDs. Left knee hurts worse than right.  She feels they "give out."  DM- has been well controlled, per pt on Metformin 500 mg daily- per pt, last a1c was 6.4. Does not check FSBS regularly.  Denies any episodes of hypoglycemia. Current Outpatient Medications on File Prior to Visit  Medication Sig Dispense Refill  . amLODipine (NORVASC) 5 MG tablet Take 2.5 mg by mouth daily.    [place] EX Apply topically.    . fluticasone (FLONASE) 50 MCG/ACT nasal spray 2 sprays by Each Nare route daily.    . Ginger, Zingiber officinalis, (GINGER ROOT PO) Take by mouth.    . loratadine (CLARITIN) 10 MG tablet Take by mouth.    . metFORMIN (GLUCOPHAGE) 500 MG tablet Take 500 mg by mouth.     No  current facility-administered medications on file prior to visit.     Allergies  Allergen Reactions  . Clonidine Anaphylaxis    Tongue swelling  . Codeine Rash    itching    Past Medical History:  Diagnosis Date  . Allergy   . Arthritis   . Cataract   . Diabetes mellitus without complication [facility])     Past Surgical History:  Procedure Laterality Date  . callous removal  Left 2017   located on 1st digit on L foot 2/2 diabetes    Family History  Problem Relation Age of Onset  . Kidney disease Mother   . Congestive Heart Failure Father   . Diabetes Maternal Aunt     Social History   Socioeconomic History  . Marital status: Single    Spouse name: Not on file  . Number of children: Not on file  . Years of education: Not on file  . Highest education level: Not on file  Social Needs  . Financial resource strain: Not on file  . Food insecurity - worry: Not on file  . Food insecurity - inability: Not on file  . Transportation needs - medical: Not on file  . Transportation needs - non-medical: Not on file  Occupational History  . Not on file  Tobacco  Use  . Smoking status: Never Smoker  . Smokeless tobacco: Never Used  Substance and Sexual Activity  . Alcohol use: No  . Drug use: No  . Sexual activity: No  Other Topics Concern  . Not on file  Social History Narrative  . Not on file   The PMH, PSH, Social History, Family History, Medications, and allergies have been reviewed in [facility], and have been updated if relevant.   Review of Systems  Eyes: Negative.   Respiratory: Negative.   Cardiovascular: Negative.   Gastrointestinal: Negative.   Endocrine: Negative.   Genitourinary: Negative.   Musculoskeletal: Positive for arthralgias.  Skin: Negative.   Allergic/Immunologic: Negative.   Neurological: Positive for weakness.  Hematological: Negative.   Psychiatric/Behavioral: Negative.   All other systems reviewed and are negative.      Objective:    BP  (!) 186/50 (BP Location: Right Arm, Patient Position: Sitting, Cuff Size: Normal)   Pulse 85   Temp 98.5 F (36.9 C) (Oral)   Ht 5' 3.25" (1.607 m)   Wt 160 lb 6.4 oz (72.8 kg)   SpO2 98%   BMI 28.19 kg/m    Physical Exam  Constitutional: She is oriented to person, place, and time. She appears well-developed and well-nourished. No distress.  HENT:  Head: Normocephalic and atraumatic.  Eyes: Conjunctivae are normal.  Cardiovascular: Normal rate, regular rhythm and normal heart sounds.  Pulmonary/Chest: Effort normal and breath sounds normal.  Musculoskeletal: She exhibits no edema.       Right knee: She exhibits no swelling and no effusion.       Left knee: She exhibits swelling and effusion.  Neurological: She is alert and oriented to person, place, and time. No cranial nerve deficit.  Skin: Skin is warm and dry. She is not diaphoretic.  Psychiatric: She has a normal mood and affect. Her behavior is normal. Judgment and thought content normal.  Nursing note and vitals reviewed.         Assessment & Plan:   Diabetes mellitus without complication ([place]) - Plan: Comprehensive metabolic panel, Hemoglobin A1c, Direct LDL  Essential hypertension  Fall at home, initial encounter  Pain in both knees, unspecified chronicity - Plan: DG Knee Complete 4 Views Left, DG Knee Complete 4 Views Left  Weakness - Plan: B12, Vitamin D (25 hydroxy), CBC with Differential/Platelet, TSH No Follow-up on file.

## 2017-09-03 ENCOUNTER — Telehealth: Payer: Self-pay

## 2017-09-03 ENCOUNTER — Telehealth: Payer: Self-pay | Admitting: Family Medicine

## 2017-09-03 ENCOUNTER — Encounter: Payer: Self-pay | Admitting: Family Medicine

## 2017-09-03 ENCOUNTER — Other Ambulatory Visit: Payer: Self-pay | Admitting: Family Medicine

## 2017-09-03 DIAGNOSIS — E559 Vitamin D deficiency, unspecified: Secondary | ICD-10-CM

## 2017-09-03 DIAGNOSIS — R899 Unspecified abnormal finding in specimens from other organs, systems and tissues: Secondary | ICD-10-CM

## 2017-09-03 DIAGNOSIS — N183 Chronic kidney disease, stage 3 unspecified: Secondary | ICD-10-CM

## 2017-09-03 MED ORDER — VITAMIN D (ERGOCALCIFEROL) 1.25 MG (50000 UNIT) PO CAPS: 50000.0000 [IU] | ORAL_CAPSULE | ORAL | 0 refills | Status: AC

## 2017-09-03 NOTE — Telephone Encounter (Signed)
It looks like she would preferred to be called.  I would like to try in her on lisinopril but I am waiting for her records first since she could have had a severe allergic reaction to in the past.  Okay to continue current dose of amlodipine for now.  And I really cannot advise her to take kidney supplements without knowing more about what they contain.

## 2017-09-03 NOTE — Telephone Encounter (Signed)
Copied from [place]. Topic: Quick Communication - See Telephone Encounter >> Sep 03, 2017  2:25 PM Burnis Medin, NT wrote: CRM for notification. See Telephone encounter for: Daughter called  and wanted to see if her Sharyn Lull could give her a call regarding medication for pt's blood pressure.   09/03/17.

## 2017-09-03 NOTE — Telephone Encounter (Signed)
TA-I spoke to daughter/I sent in Rx Vit-Shaya Altamura/She is going to discuss kidney referral and sent me a MyChart message tomorrow  She asked me about "[facility]" as holistic BP med and I told her that I would find it online and print out the ingredients because we don't want to add insult to injury and she agreed  I asked her to call the pharmacy to ask them EVERY BP med she has ever been on per their history and she agreed and will get me that list  As far as the lab in 8-12 weeks to recheck Vit-Jolanda Mccann; do you want to also recheck a CMP or BMP for kidney function as well? plz advise/thx dmf

## 2017-09-03 NOTE — Telephone Encounter (Signed)
Spoke to daughter in detail/see separate phone note to TA/thx dmf

## 2017-09-03 NOTE — Telephone Encounter (Signed)
Pt called in stating that she would like a phone call regarding if she should take the low dose or high dose BP med or if it is going to be changed to something that won't affect her kidneys. She said her computer isn't working properly

## 2017-09-03 NOTE — Telephone Encounter (Signed)
-----   Message from Lucille Passy, MD sent at 09/03/2017  7:16 AM EST ----- Please call pt- her knee xray actually showed very little arthritis in her knees so arthritis is not causing her symptoms. Her Vit Kayode Petion was low which can cause muscle aches and fatigue. Please call in Vit D3 50,000 units x 6 weeks - 1 tab po weekly x 6 weeks (number 6, no refills), then continue VitD 1600 IU daily.  Recheck in 8-12 weeks (268.0) Her kidney function is lower than normal and although I do not know her baseline, I would like for her to see a kidney specialist. Please let me know if she would agree to a referral and I will place it.

## 2017-09-03 NOTE — Telephone Encounter (Signed)
Yes we can repeat a BMET as well. And thank you!!!

## 2017-09-04 NOTE — Telephone Encounter (Signed)
Future order placed for BMP & Vit-Mariah Harn per TA/thx dmf

## 2017-09-05 NOTE — Telephone Encounter (Signed)
Copied from [place]. Topic: Quick Communication - See Telephone Encounter >> Sep 03, 2017  2:25 PM Burnis Medin, NT wrote: CRM for notification. See Telephone encounter for: Daughter called  and wanted to see if her Sharyn Lull could give her a call regarding medication for pt's blood pressure.   09/03/17. >> Sep 05, 2017  1:28 PM Neva Seat wrote: Pt's daughter wants Sharyn Lull (RMA) to give her mother a call about Blood Pressure Rx prescribed.  Please call pt to discuss asap.

## 2017-09-07 ENCOUNTER — Encounter: Payer: Self-pay | Admitting: Family Medicine

## 2017-09-09 ENCOUNTER — Encounter: Payer: Self-pay | Admitting: Family Medicine

## 2017-09-10 ENCOUNTER — Other Ambulatory Visit: Payer: Self-pay | Admitting: Family Medicine

## 2017-09-10 DIAGNOSIS — G629 Polyneuropathy, unspecified: Secondary | ICD-10-CM

## 2017-09-11 ENCOUNTER — Other Ambulatory Visit: Payer: Self-pay

## 2017-09-11 MED ORDER — LISINOPRIL 5 MG PO TABS: ORAL_TABLET | ORAL | 0 refills | Status: DC

## 2017-09-11 NOTE — Progress Notes (Signed)
Add Lisinopril 5mg  1/2qd while still taking Amlodipine per TA/thx dmf

## 2017-09-25 ENCOUNTER — Encounter: Payer: Self-pay | Admitting: Family Medicine

## 2017-09-26 ENCOUNTER — Other Ambulatory Visit: Payer: Self-pay

## 2017-09-26 MED ORDER — LISINOPRIL 5 MG PO TABS: ORAL_TABLET | ORAL | 0 refills | Status: DC

## 2017-10-28 ENCOUNTER — Encounter: Payer: Self-pay | Admitting: Family Medicine

## 2017-10-31 ENCOUNTER — Encounter: Payer: Self-pay | Admitting: Family Medicine

## 2017-10-31 ENCOUNTER — Ambulatory Visit (INDEPENDENT_AMBULATORY_CARE_PROVIDER_SITE_OTHER): Payer: Medicare Other

## 2017-10-31 ENCOUNTER — Ambulatory Visit: Payer: Medicare Other | Admitting: Family Medicine

## 2017-10-31 VITALS — BP 154/90 | HR 88 | Temp 98.4°F | Ht 63.25 in | Wt 155.4 lb

## 2017-10-31 DIAGNOSIS — M25561 Pain in right knee: Secondary | ICD-10-CM | POA: Diagnosis not present

## 2017-10-31 DIAGNOSIS — M25562 Pain in left knee: Secondary | ICD-10-CM

## 2017-10-31 NOTE — Assessment & Plan Note (Signed)
Structurally her knees look good.  Has a mild effusion in each knee.  Likely has a component of patellofemoral pain.  May have exacerbated some mild underlying arthritis. -Provided Pennsaid samples -Counseled on ice and compression -Counseled on home exercise therapy -If no improvement would consider x-ray of the right knee, injection, and/or physical therapy.

## 2017-10-31 NOTE — Progress Notes (Signed)
[name] - 72 y.o. female MRN [id number]  Date of birth: October 20, 1945  SUBJECTIVE:  Including CC & ROS.  Chief Complaint  Patient presents with  . Knee Pain    Patient is here today C/O knee pain.  She states that both knees are painful but the right is worse than the left.  She has used OTC rubs and meds without improvement.    [name] is a 72 y.o. female that is presenting with left and right knee pain.  The right is worse than the left.  The pain is been ongoing for about 2 months.  She fell on 2/24 and hurt her knees on the floor.  She tripped over the threshold.  The pain is anterior in nature.  The pain is worse with ambulation and rising from a seated position.  The pain is been staying the same.  Denies any prior surgeries.  Pain is been staying the same.  Independent review of the left knee x-ray from 2/26 shows minimal to no degenerative changes.   Review of Systems  Constitutional: Negative for fever.  HENT: Negative for congestion.   Respiratory: Negative for cough.   Cardiovascular: Negative for chest pain.  Gastrointestinal: Negative for abdominal pain.  Musculoskeletal: Positive for arthralgias and gait problem.  Skin: Negative for color change.  Neurological: Negative for weakness.  Hematological: Negative for adenopathy.  Psychiatric/Behavioral: Negative for agitation.    HISTORY: Past Medical, Surgical, Social, and Family History Reviewed & Updated per EMR.   Pertinent Historical Findings include:  Past Medical History:  Diagnosis Date  . Allergy   . Arthritis   . Cataract   . Diabetes mellitus without complication [facility])     Past Surgical History:  Procedure Laterality Date  . callous removal  Left 2017   located on 1st digit on L foot 2/2 diabetes    Allergies  Allergen Reactions  . Clonidine Anaphylaxis    Tongue swelling  . Codeine Rash    itching    Family History  Problem Relation Age of Onset  . Kidney disease Mother   .  Congestive Heart Failure Father   . Diabetes Maternal Aunt      Social History   Socioeconomic History  . Marital status: Single    Spouse name: Not on file  . Number of children: Not on file  . Years of education: Not on file  . Highest education level: Not on file  Occupational History  . Not on file  Social Needs  . Financial resource strain: Not on file  . Food insecurity:    Worry: Not on file    Inability: Not on file  . Transportation needs:    Medical: Not on file    Non-medical: Not on file  Tobacco Use  . Smoking status: Never Smoker  . Smokeless tobacco: Never Used  Substance and Sexual Activity  . Alcohol use: No  . Drug use: No  . Sexual activity: Never  Lifestyle  . Physical activity:    Days per week: Not on file    Minutes per session: Not on file  . Stress: Not on file  Relationships  . Social connections:    Talks on phone: Not on file    Gets together: Not on file    Attends religious service: Not on file    Active member of club or organization: Not on file    Attends meetings of clubs or organizations: Not on file  Relationship status: Not on file  . Intimate partner violence:    Fear of current or ex partner: Not on file    Emotionally abused: Not on file    Physically abused: Not on file    Forced sexual activity: Not on file  Other Topics Concern  . Not on file  Social History Narrative  . Not on file     PHYSICAL EXAM:  VS: BP (!) 154/90 (BP Location: Left Arm, Cuff Size: Normal)   Pulse 88   Temp 98.4 F (36.9 C) (Oral)   Ht 5' 3.25" (1.607 m)   Wt 155 lb 6.4 oz (70.5 kg)   SpO2 98%   BMI 27.31 kg/m  Physical Exam Gen: NAD, alert, cooperative with exam, well-appearing ENT: normal lips, normal nasal mucosa,  Eye: normal EOM, normal conjunctiva and lids CV:  no edema, +2 pedal pulses   Resp: no accessory muscle use, non-labored,  Skin: no rashes, no areas of induration  Neuro: normal tone, normal sensation to  touch Psych:  normal insight, alert and oriented MSK:  Left and Right Knee: Normal to inspection with no erythema or effusion or obvious bony abnormalities. Palpation normal with no warmth, patellar tenderness, or condyle tenderness. Right knee with mild tenderness over the medial joint line.  Left knee with no medial lateral joint line tenderness. ROM full in flexion and extension and lower leg rotation. Ligaments with solid consistent endpoints including LCL, MCL. Negative Mcmurray's Non painful patellar compression. Patellar glide without crepitus. Patellar and quadriceps tendons unremarkable. Hamstring and quadriceps strength is normal.  Neurovascularly intact   Limited ultrasound: Left and right knee:  Left knee: Mild effusion in the suprapatellar pouch. Normal-appearing quadricep and patellar tendon. Normal-appearing joint spaces in the medial and lateral area. No outpouching of the meniscus.  Right knee: Mild effusion in the suprapatellar pouch.   Normal-appearing joint space medially and laterally. Normal-appearing quadricep and patellar tendon   Summary: Normal exam  Ultrasound and interpretation by Clearance Coots, MD    ASSESSMENT & PLAN:   Knee pain, bilateral Structurally her knees look good.  Has a mild effusion in each knee.  Likely has a component of patellofemoral pain.  May have exacerbated some mild underlying arthritis. -Provided Pennsaid samples -Counseled on ice and compression -Counseled on home exercise therapy -If no improvement would consider x-ray of the right knee, injection, and/or physical therapy.

## 2017-10-31 NOTE — Patient Instructions (Signed)
Good to meet you Please try ice and compression on your knees   Please follow up with me in 4 weeks if your pain hasn't improved.   Take tylenol 650 mg three times a day is the best evidence based medicine we have for arthritis.   Glucosamine sulfate 750mg  twice a day is a supplement that has been shown to help moderate to severe arthritis.  Vitamin D 2000 IU daily  Fish oil 2 grams daily.   Tumeric 500mg  twice daily.   Capsaicin topically up to four times a day may also help with pain.  Cortisone injections are an option if these interventions do not seem to make a difference or need more relief.    It's important that you continue to stay active.  Consider physical therapy to strengthen muscles around the joint that hurts to take pressure off of the joint itself.

## 2017-11-11 ENCOUNTER — Other Ambulatory Visit: Payer: Medicare Other

## 2017-11-24 ENCOUNTER — Other Ambulatory Visit: Payer: Medicare Other

## 2018-01-22 ENCOUNTER — Telehealth: Payer: Self-pay

## 2018-01-22 NOTE — Telephone Encounter (Signed)
Copied from [place] [id number]. Topic: Referral - Status >> Jan 21, 2018 10:57 AM Antonieta Iba C wrote:   Reason for CRM: [facility] is calling in to make office aware that they are closing the referral due to them being unable to reach pt to schedule.

## 2018-02-18 ENCOUNTER — Ambulatory Visit: Payer: Medicare Other | Admitting: Nurse Practitioner

## 2018-02-18 ENCOUNTER — Ambulatory Visit (INDEPENDENT_AMBULATORY_CARE_PROVIDER_SITE_OTHER): Payer: Medicare Other

## 2018-02-18 ENCOUNTER — Encounter: Payer: Self-pay | Admitting: Nurse Practitioner

## 2018-02-18 ENCOUNTER — Encounter: Payer: Self-pay | Admitting: Family Medicine

## 2018-02-18 VITALS — BP 190/100 | HR 78 | Temp 97.9°F | Ht 63.25 in | Wt 160.8 lb

## 2018-02-18 DIAGNOSIS — N183 Chronic kidney disease, stage 3 unspecified: Secondary | ICD-10-CM

## 2018-02-18 DIAGNOSIS — R05 Cough: Secondary | ICD-10-CM

## 2018-02-18 DIAGNOSIS — E559 Vitamin D deficiency, unspecified: Secondary | ICD-10-CM | POA: Diagnosis not present

## 2018-02-18 DIAGNOSIS — R059 Cough, unspecified: Secondary | ICD-10-CM

## 2018-02-18 LAB — BASIC METABOLIC PANEL
BUN: 19 mg/dL (ref 6–23)
CO2: 25 meq/L (ref 19–32)
Calcium: 9.5 mg/dL (ref 8.4–10.5)
Chloride: 108 mEq/L (ref 96–112)
Creatinine, Ser: 1.7 mg/dL — ABNORMAL HIGH (ref 0.40–1.20)
GFR: 31.34 mL/min — AB (ref >60.00)
GLUCOSE: 178 mg/dL — AB (ref 70–99)
POTASSIUM: 4.3 meq/L (ref 3.5–5.1)
Sodium: 141 mEq/L (ref 135–145)

## 2018-02-18 MED ORDER — GUAIFENESIN-DM 100-10 MG/5ML PO SYRP: 5.0000 mL | ORAL_SOLUTION | ORAL | 0 refills | Status: DC | PRN

## 2018-02-18 MED ORDER — BENZONATATE 100 MG PO CAPS: 100.0000 mg | ORAL_CAPSULE | Freq: Three times a day (TID) | ORAL | 0 refills | Status: DC | PRN

## 2018-02-18 NOTE — Progress Notes (Signed)
Subjective:  Patient ID: [name], female    DOB: 12-08-1945  Age: 72 y.o. MRN: [id number]  CC: Fatigue (pt c/o fatigue for 2 days , weakness , poor sleep and a cough for 7 days )  accompanied by daughter  Cough  This is a new problem. The current episode started in the past 7 days. The problem has been waxing and waning. The cough is non-productive. Associated symptoms include nasal congestion, postnasal drip and rhinorrhea. Pertinent negatives include no chest pain, chills, fever, headaches, heartburn, sore throat, shortness of breath, sweats or wheezing. The symptoms are aggravated by lying down. She has tried nothing for the symptoms.   HTN: Reports home BP readings 130s/80s. Does not take amlodipine or lisinopril. Denies taking any OTC medication. Denies any HA or CP or dizziness or SOB or altered LOC.  CKD: Needs repeat BMP. No edema. Has not made appt with nephrology as recommended by pcp.  Vitamin D Insufficiency: Needs level repeated. Completed supplements prescribed  Reviewed past Medical, Social and Family history today.  Outpatient Medications Prior to Visit  Medication Sig Dispense Refill  . ARNICA EX Apply topically.    Marland Kitchen augmented betamethasone dipropionate (DIPROLENE-AF) 0.05 % cream Apply topically.    . diclofenac sodium (VOLTAREN) 1 % GEL Apply 2 g topically 4 (four) times daily. 100 g 0  . fluticasone (FLONASE) 50 MCG/ACT nasal spray 2 sprays by Each Nare route daily.    . Ginger, Zingiber officinalis, (GINGER ROOT PO) Take by mouth.    Marland Kitchen glucose blood (PRECISION QID TEST) test strip Check sugar twice a day to monitor readings    . loratadine (CLARITIN) 10 MG tablet Take by mouth.    . Misc Natural Products (TUMERSAID) TABS Take by mouth.    . NON FORMULARY Take 300 mg by mouth 3 (three) times daily.    Marland Kitchen amLODipine (NORVASC) 5 MG tablet Take 2.5 mg by mouth daily.    Marland Kitchen lisinopril (PRINIVIL,ZESTRIL) 5 MG tablet Take 1/2 tablet daily (Patient not  taking: Reported on 02/18/2018) 90 tablet 0  . metFORMIN (GLUCOPHAGE) 500 MG tablet Take 500 mg by mouth.     No facility-administered medications prior to visit.     ROS See HPI  Objective:  BP (!) 190/100 (BP Location: Right Arm, Patient Position: Sitting, Cuff Size: Normal)   Pulse 78   Temp 97.9 F (36.6 C) (Oral)   Ht 5' 3.25" (1.607 m)   Wt 160 lb 12.8 oz (72.9 kg)   SpO2 97%   BMI 28.26 kg/m   BP Readings from Last 3 Encounters:  02/18/18 (!) 190/100  10/31/17 (!) 154/90  09/02/17 (!) 186/50    Wt Readings from Last 3 Encounters:  02/18/18 160 lb 12.8 oz (72.9 kg)  10/31/17 155 lb 6.4 oz (70.5 kg)  09/02/17 160 lb 6.4 oz (72.8 kg)    Physical Exam  Constitutional: She is oriented to person, place, and time. No distress.  Cardiovascular: Normal rate and regular rhythm.  Pulmonary/Chest: Effort normal and breath sounds normal.  Musculoskeletal: She exhibits no edema.  Neurological: She is alert and oriented to person, place, and time.  Vitals reviewed.   Lab Results  Component Value Date   WBC 10.6 (H) 09/02/2017   HGB 12.5 09/02/2017   HCT 37.2 09/02/2017   PLT 272.0 09/02/2017   GLUCOSE 178 (H) 02/18/2018   LDLDIRECT 138.0 09/02/2017   ALT 13 09/02/2017   AST 11 09/02/2017   NA 141 02/18/2018  K 4.3 02/18/2018   CL 108 02/18/2018   CREATININE 1.70 (H) 02/18/2018   BUN 19 02/18/2018   CO2 25 02/18/2018   TSH 3.93 09/02/2017   HGBA1C 7.3 (H) 09/02/2017    Dg Foot Complete Left  Result Date: 09/03/2016 CLINICAL DATA:  A healing wound over the lateral aspect of the left foot at the base of the fifth metatarsal. The wound is bandage. EXAM: LEFT FOOT - COMPLETE 3+ VIEW COMPARISON:  Left foot series of June 05, 2016 FINDINGS: The soft tissue ulceration overlies the head of the fifth metatarsal and the adjacent fifth MTP joint. No underlying bony changes are observed. The joint space is well maintained. The phalanges and other metatarsals exhibit no  acute abnormalities. Mild osteoarthritic joint space loss of the IP joints of the second through fourth toes is noted. The tarsal bones are unremarkable. IMPRESSION: Findings compatible with cellulitis of the soft tissues overlying the head of the fifth metatarsal. No underlying bony changes are seen to suggest osteomyelitis. No findings suspicious for septic arthritis. Electronically Signed   By: David  Martinique M.D.   On: 09/03/2016 12:53    Assessment & Plan:   [name] was seen today for fatigue.  Diagnoses and all orders for this visit:  Cough -     benzonatate (TESSALON) 100 MG capsule; Take 1 capsule (100 mg total) by mouth 3 (three) times daily as needed for cough. -     guaiFENesin-dextromethorphan (ROBITUSSIN DM) 100-10 MG/5ML syrup; Take 5 mLs by mouth every 4 (four) hours as needed for cough. -     DG Chest 2 View  Vitamin D deficiency -     Vitamin D 1,25 dihydroxy -     Cancel: VITAMIN D 25 Hydroxy (Vit-D Deficiency, Fractures)  CKD (chronic kidney disease) stage 3, GFR 30-59 ml/min (HCC) -     Basic metabolic panel   I am having [name] start on benzonatate and guaiFENesin-dextromethorphan. I am also having her maintain her metFORMIN, amLODipine, fluticasone, loratadine, ARNICA EX, (Ginger, Zingiber officinalis, (GINGER ROOT PO)), diclofenac sodium, lisinopril, TUMERSAID, glucose blood, augmented betamethasone dipropionate, and NON FORMULARY.  Meds ordered this encounter  Medications  . benzonatate (TESSALON) 100 MG capsule    Sig: Take 1 capsule (100 mg total) by mouth 3 (three) times daily as needed for cough.    Dispense:  30 capsule    Refill:  0    Order Specific Question:   Supervising Provider    Answer:   Clearance Coots E [[id number]]  . guaiFENesin-dextromethorphan (ROBITUSSIN DM) 100-10 MG/5ML syrup    Sig: Take 5 mLs by mouth every 4 (four) hours as needed for cough.    Dispense:  118 mL    Refill:  0    Order Specific Question:   Supervising Provider     Answer:   Clearance Coots E [[id number]]    Follow-up: Return if symptoms worsen or fail to improve.  Wilfred Lacy, NP

## 2018-02-18 NOTE — Patient Instructions (Addendum)
No change in renal function. Make appt with nephrology as directed by pcp.  Pending vitamin D  Normal CXR  Maintain adequate oral hydration.

## 2018-02-20 ENCOUNTER — Encounter: Payer: Self-pay | Admitting: Nurse Practitioner

## 2018-02-21 LAB — VITAMIN D 1,25 DIHYDROXY
VITAMIN D 1, 25 (OH) TOTAL: 29 pg/mL (ref 18–72)
VITAMIN D3 1, 25 (OH): 29 pg/mL

## 2018-02-23 ENCOUNTER — Encounter: Payer: Self-pay | Admitting: Family Medicine

## 2018-02-24 ENCOUNTER — Ambulatory Visit: Payer: Medicare Other | Admitting: Family Medicine

## 2018-02-24 ENCOUNTER — Encounter (HOSPITAL_COMMUNITY): Payer: Self-pay

## 2018-02-24 ENCOUNTER — Inpatient Hospital Stay (HOSPITAL_COMMUNITY)
Admission: EM | Admit: 2018-02-24 | Discharge: 2018-02-27 | DRG: 065 | Disposition: A | Discharge status: Rehabilitation Facility | Payer: Medicare Other | Attending: Internal Medicine | Admitting: Internal Medicine

## 2018-02-24 ENCOUNTER — Encounter: Payer: Self-pay | Admitting: Family Medicine

## 2018-02-24 ENCOUNTER — Other Ambulatory Visit: Payer: Self-pay

## 2018-02-24 ENCOUNTER — Emergency Department (HOSPITAL_COMMUNITY): Payer: Medicare Other

## 2018-02-24 VITALS — BP 220/88 | HR 82 | Temp 98.6°F | Ht 63.25 in | Wt 157.4 lb

## 2018-02-24 DIAGNOSIS — E13311 Other specified diabetes mellitus with unspecified diabetic retinopathy with macular edema: Secondary | ICD-10-CM | POA: Diagnosis present

## 2018-02-24 DIAGNOSIS — I6340 Cerebral infarction due to embolism of unspecified cerebral artery: Secondary | ICD-10-CM | POA: Diagnosis present

## 2018-02-24 DIAGNOSIS — E11311 Type 2 diabetes mellitus with unspecified diabetic retinopathy with macular edema: Secondary | ICD-10-CM | POA: Diagnosis not present

## 2018-02-24 DIAGNOSIS — E1151 Type 2 diabetes mellitus with diabetic peripheral angiopathy without gangrene: Secondary | ICD-10-CM | POA: Diagnosis not present

## 2018-02-24 DIAGNOSIS — E663 Overweight: Secondary | ICD-10-CM | POA: Diagnosis not present

## 2018-02-24 DIAGNOSIS — E1122 Type 2 diabetes mellitus with diabetic chronic kidney disease: Secondary | ICD-10-CM | POA: Diagnosis not present

## 2018-02-24 DIAGNOSIS — R29898 Other symptoms and signs involving the musculoskeletal system: Secondary | ICD-10-CM | POA: Diagnosis not present

## 2018-02-24 DIAGNOSIS — E119 Type 2 diabetes mellitus without complications: Secondary | ICD-10-CM

## 2018-02-24 DIAGNOSIS — I10 Essential (primary) hypertension: Secondary | ICD-10-CM | POA: Diagnosis not present

## 2018-02-24 DIAGNOSIS — E1165 Type 2 diabetes mellitus with hyperglycemia: Secondary | ICD-10-CM | POA: Diagnosis present

## 2018-02-24 DIAGNOSIS — R531 Weakness: Secondary | ICD-10-CM | POA: Diagnosis not present

## 2018-02-24 DIAGNOSIS — I160 Hypertensive urgency: Secondary | ICD-10-CM | POA: Insufficient documentation

## 2018-02-24 DIAGNOSIS — R29706 NIHSS score 6: Secondary | ICD-10-CM | POA: Diagnosis present

## 2018-02-24 DIAGNOSIS — Z888 Allergy status to other drugs, medicaments and biological substances status: Secondary | ICD-10-CM

## 2018-02-24 DIAGNOSIS — Z79899 Other long term (current) drug therapy: Secondary | ICD-10-CM | POA: Diagnosis not present

## 2018-02-24 DIAGNOSIS — G8194 Hemiplegia, unspecified affecting left nondominant side: Secondary | ICD-10-CM | POA: Diagnosis present

## 2018-02-24 DIAGNOSIS — Z885 Allergy status to narcotic agent status: Secondary | ICD-10-CM

## 2018-02-24 DIAGNOSIS — Z6827 Body mass index (BMI) 27.0-27.9, adult: Secondary | ICD-10-CM | POA: Diagnosis not present

## 2018-02-24 DIAGNOSIS — Z7984 Long term (current) use of oral hypoglycemic drugs: Secondary | ICD-10-CM | POA: Diagnosis not present

## 2018-02-24 DIAGNOSIS — I639 Cerebral infarction, unspecified: Secondary | ICD-10-CM | POA: Diagnosis not present

## 2018-02-24 DIAGNOSIS — N183 Chronic kidney disease, stage 3 unspecified: Secondary | ICD-10-CM | POA: Diagnosis present

## 2018-02-24 DIAGNOSIS — I161 Hypertensive emergency: Secondary | ICD-10-CM | POA: Diagnosis not present

## 2018-02-24 DIAGNOSIS — Z8673 Personal history of transient ischemic attack (TIA), and cerebral infarction without residual deficits: Secondary | ICD-10-CM

## 2018-02-24 DIAGNOSIS — I129 Hypertensive chronic kidney disease with stage 1 through stage 4 chronic kidney disease, or unspecified chronic kidney disease: Secondary | ICD-10-CM | POA: Diagnosis not present

## 2018-02-24 HISTORY — DX: Essential (primary) hypertension: I10

## 2018-02-24 HISTORY — DX: Unspecified macular degeneration: H35.30

## 2018-02-24 LAB — URINALYSIS, ROUTINE W REFLEX MICROSCOPIC
Bilirubin Urine: NEGATIVE
Glucose, UA: 50 mg/dL — AB
Ketones, ur: NEGATIVE mg/dL
Leukocytes, UA: NEGATIVE
Nitrite: NEGATIVE
PH: 6 (ref 5.0–8.0)
Protein, ur: 100 mg/dL — AB
SPECIFIC GRAVITY, URINE: 1.006 (ref 1.005–1.030)

## 2018-02-24 LAB — CBC
HEMATOCRIT: 38.4 % (ref 36.0–46.0)
HEMOGLOBIN: 12.6 g/dL (ref 12.0–15.0)
MCH: 27.5 pg (ref 26.0–34.0)
MCHC: 32.8 g/dL (ref 30.0–36.0)
MCV: 83.7 fL (ref 78.0–100.0)
Platelets: 232 10*3/uL (ref 150–400)
RBC: 4.59 MIL/uL (ref 3.87–5.11)
RDW: 13.5 % (ref 11.5–15.5)
WBC: 8.4 10*3/uL (ref 4.0–10.5)

## 2018-02-24 LAB — RAPID URINE DRUG SCREEN, HOSP PERFORMED
AMPHETAMINES: NOT DETECTED
BARBITURATES: NOT DETECTED
Benzodiazepines: NOT DETECTED
COCAINE: NOT DETECTED
TETRAHYDROCANNABINOL: NOT DETECTED

## 2018-02-24 LAB — BASIC METABOLIC PANEL
Anion gap: 10 (ref 5–15)
BUN: 31 mg/dL — ABNORMAL HIGH (ref 8–23)
CHLORIDE: 107 mmol/L (ref 98–111)
CO2: 27 mmol/L (ref 22–32)
Calcium: 10.1 mg/dL (ref 8.9–10.3)
Creatinine, Ser: 1.68 mg/dL — ABNORMAL HIGH (ref 0.44–1.00)
GFR calc Af Amer: 34 mL/min — ABNORMAL LOW (ref >60)
GFR calc non Af Amer: 29 mL/min — ABNORMAL LOW (ref >60)
GLUCOSE: 167 mg/dL — AB (ref 70–99)
POTASSIUM: 4 mmol/L (ref 3.5–5.1)
Sodium: 144 mmol/L (ref 135–145)

## 2018-02-24 LAB — DIFFERENTIAL
BASOS ABS: 0.1 10*3/uL (ref 0.0–0.1)
BASOS PCT: 1 %
EOS ABS: 0.3 10*3/uL (ref 0.0–0.7)
Eosinophils Relative: 3 %
LYMPHS ABS: 1.8 10*3/uL (ref 0.7–4.0)
Lymphocytes Relative: 22 %
MONO ABS: 0.5 10*3/uL (ref 0.1–1.0)
MONOS PCT: 6 %
Neutro Abs: 5.7 10*3/uL (ref 1.7–7.7)
Neutrophils Relative %: 68 %

## 2018-02-24 LAB — CBG MONITORING, ED
GLUCOSE-CAPILLARY: 158 mg/dL — AB (ref 70–99)
GLUCOSE-CAPILLARY: 215 mg/dL — AB (ref 70–99)

## 2018-02-24 LAB — PROTIME-INR
INR: 0.96
PROTHROMBIN TIME: 12.7 s (ref 11.4–15.2)

## 2018-02-24 LAB — POCT GLUCOSE (DEVICE FOR HOME USE): POC GLUCOSE: 197 mg/dL — AB (ref 70–99)

## 2018-02-24 LAB — ETHANOL

## 2018-02-24 LAB — TSH: TSH: 4.082 u[IU]/mL (ref 0.350–4.500)

## 2018-02-24 LAB — MAGNESIUM: MAGNESIUM: 2.2 mg/dL (ref 1.7–2.4)

## 2018-02-24 LAB — I-STAT TROPONIN, ED: Troponin i, poc: 0.02 ng/mL (ref 0.00–0.08)

## 2018-02-24 LAB — APTT: aPTT: 23 seconds — ABNORMAL LOW (ref 24–36)

## 2018-02-24 LAB — PHOSPHORUS: Phosphorus: 3.6 mg/dL (ref 2.5–4.6)

## 2018-02-24 MED ORDER — SODIUM CHLORIDE 0.9 % IV SOLN
INTRAVENOUS | Status: DC
  Administered 2018-02-25: via INTRAVENOUS

## 2018-02-24 MED ORDER — ASPIRIN 325 MG PO TABS
325.0000 mg | ORAL_TABLET | Freq: Every day | ORAL | Status: DC
  Administered 2018-02-25: 325 mg via ORAL
  Filled 2018-02-24: qty 1

## 2018-02-24 MED ORDER — STROKE: EARLY STAGES OF RECOVERY BOOK
Freq: Once | Status: AC
  Administered 2018-02-24
  Filled 2018-02-24 (×2): qty 1

## 2018-02-24 MED ORDER — ASPIRIN 300 MG RE SUPP: 300.0000 mg | Freq: Every day | RECTAL | Status: DC

## 2018-02-24 MED ORDER — LISINOPRIL 5 MG PO TABS
2.5000 mg | ORAL_TABLET | Freq: Every day | ORAL | Status: DC
  Administered 2018-02-25 – 2018-02-27 (×3): 2.5 mg via ORAL
  Filled 2018-02-24 (×3): qty 1

## 2018-02-24 MED ORDER — LORATADINE 10 MG PO TABS
10.0000 mg | ORAL_TABLET | Freq: Every day | ORAL | Status: DC
  Administered 2018-02-25: 10 mg via ORAL
  Filled 2018-02-24 (×2): qty 1

## 2018-02-24 MED ORDER — FLUTICASONE PROPIONATE 50 MCG/ACT NA SUSP
2.0000 | Freq: Every day | NASAL | Status: DC
  Administered 2018-02-26 – 2018-02-27 (×2): 2 via NASAL
  Filled 2018-02-24 (×2): qty 16

## 2018-02-24 MED ORDER — INSULIN ASPART 100 UNIT/ML ~~LOC~~ SOLN
0.0000 [IU] | Freq: Every day | SUBCUTANEOUS | Status: DC
  Administered 2018-02-24: 2 [IU] via SUBCUTANEOUS
  Filled 2018-02-24: qty 1

## 2018-02-24 MED ORDER — LABETALOL HCL 5 MG/ML IV SOLN
10.0000 mg | Freq: Once | INTRAVENOUS | Status: AC
  Administered 2018-02-24: 10 mg via INTRAVENOUS
  Filled 2018-02-24: qty 4

## 2018-02-24 MED ORDER — ACETAMINOPHEN 160 MG/5ML PO SOLN: 650.0000 mg | ORAL | Status: DC | PRN

## 2018-02-24 MED ORDER — INSULIN ASPART 100 UNIT/ML ~~LOC~~ SOLN
0.0000 [IU] | Freq: Three times a day (TID) | SUBCUTANEOUS | Status: DC
  Administered 2018-02-25: 1 [IU] via SUBCUTANEOUS
  Administered 2018-02-25: 2 [IU] via SUBCUTANEOUS
  Administered 2018-02-25: 3 [IU] via SUBCUTANEOUS
  Administered 2018-02-26 – 2018-02-27 (×5): 2 [IU] via SUBCUTANEOUS

## 2018-02-24 MED ORDER — ACETAMINOPHEN 325 MG PO TABS: 650.0000 mg | ORAL_TABLET | ORAL | Status: DC | PRN

## 2018-02-24 MED ORDER — ACETAMINOPHEN 650 MG RE SUPP: 650.0000 mg | RECTAL | Status: DC | PRN

## 2018-02-24 MED ORDER — SENNOSIDES-DOCUSATE SODIUM 8.6-50 MG PO TABS: 1.0000 | ORAL_TABLET | Freq: Every evening | ORAL | Status: DC | PRN

## 2018-02-24 NOTE — ED Notes (Signed)
Hopsitalist notified of patient's blood pressure. Advised to give labetalol.

## 2018-02-24 NOTE — ED Notes (Signed)
Pt transported by [facility] to [facility]

## 2018-02-24 NOTE — Consult Note (Addendum)
Referring Physician: Dr. Johnney Killian    Chief Complaint: LUE weakness and numbness  HPI: [name] is an 72 y.o. female who presented to the ED from her PCP's office with LUE weakness and elevated BP. Her symptoms began 3 days previously with sensation of her LUE being heavy, weak and clumsy. Her BP at home also had been elevated. At her PCP's office her SBP was > 200.   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 Also noted on MRI were several chronic infarctions. Labetalol was administered for BP control.  She is not on an antiplatelet agent or anticoagulation at home.   LSN: Approximately 3 days ago tPA Given: No: Out of time window.   Past Medical History:  Diagnosis Date  . Allergy   . Arthritis   . Cataract   . Diabetes mellitus without complication [facility])     Past Surgical History:  Procedure Laterality Date  . callous removal  Left 2017   located on 1st digit on L foot 2/2 diabetes    Family History  Problem Relation Age of Onset  . Kidney disease Mother   . Congestive Heart Failure Father   . Diabetes Maternal Aunt    Social History:  reports that she has never smoked. She has never used smokeless tobacco. She reports that she does not drink alcohol or use drugs.  Allergies:  Allergies  Allergen Reactions  . Clonidine Anaphylaxis, Swelling and Other (See Comments)    Tongue swelling  . Codeine Itching and Rash    Medications:  Prior to Admission:  (Not in a hospital admission) Scheduled: .  stroke: mapping our early stages of recovery book   Does not apply Once  . aspirin  300 mg Rectal Daily   Or  . aspirin  325 mg Oral Daily  . [START ON 02/25/2018] fluticasone  2 spray Each Nare Daily  . insulin aspart  0-5 Units Subcutaneous QHS  . [START ON 02/25/2018] insulin aspart  0-9 Units Subcutaneous TID WC  . [START ON  02/25/2018] lisinopril  2.5 mg Oral Daily  . [START ON 02/25/2018] loratadine  10 mg Oral Daily   Continuous: . sodium chloride      ROS: No headache, visual loss, difficulty speaking, confusion or limb pain. Other ROS as per HPI.   Physical Examination: Blood pressure (!) 203/73, pulse 78, temperature 97.7 F (36.5 C), temperature source Oral, resp. rate 14, height 5' 3.25" (1.607 m), weight 71.3 kg, SpO2 94 %.  HEENT: [place]/AT Lungs: Respirations unlabored. Ext: No edema.   Neurologic Examination: Mental Status: Alert, fully oriented, thought content appropriate.  Speech fluent without evidence of aphasia.  Able to follow all commands without difficulty. Cranial Nerves: II:  Visual fields intact bilaterally. No visual extinction to DSS. PERRL.  III,IV, VI: Left palpebral fissure smaller than right at baseline. EOMI without nystagmus.  V: Decreased temperature sensation on left side of face VII: No facial droop.  VIII: hearing intact to voice IX,X: no hoarseness or hypophonia XI: Subtle lag on left with shoulder shrug XII: midline tongue extension  Motor: RUE and RLE: 5/5 proximal and distal LUE: 3 to 4-/5 proximal and distal LLE: 4+/5. Also noted is external rotation of left lower ext at rest Sensory: Temp and FT sensation equal bilaterally. Positive for left sided extinction with DSS.  Deep Tendon Reflexes:  2+ bilateral upper and lower  extremities, except for 4+ left patellar with crossed adductor response on the right. Plantars: Mute bilaterally Cerebellar: LUE ataxic with FNF, but not disproportionate to LUE weakness. H-S normal bilaterally Gait: Deferred   Results for orders placed or performed during the hospital encounter of 02/24/18 (from the past 48 hour(s))  CBG monitoring, ED     Status: Abnormal   Collection Time: 02/24/18  1:25 PM  Result Value Ref Range   Glucose-Capillary 158 (H) 70 - 99 mg/dL  Basic metabolic panel     Status: Abnormal   Collection Time:  02/24/18  1:30 PM  Result Value Ref Range   Sodium 144 135 - 145 mmol/L   Potassium 4.0 3.5 - 5.1 mmol/L   Chloride 107 98 - 111 mmol/L   CO2 27 22 - 32 mmol/L   Glucose, Bld 167 (H) 70 - 99 mg/dL   BUN 31 (H) 8 - 23 mg/dL   Creatinine, Ser 1.68 (H) 0.44 - 1.00 mg/dL   Calcium 10.1 8.9 - 10.3 mg/dL   GFR calc non Af Amer 29 (L) >60 mL/min   GFR calc Af Amer 34 (L) >60 mL/min    Comment: (NOTE) The eGFR has been calculated using the CKD EPI equation. This calculation has not been validated in all clinical situations. eGFR's persistently <60 mL/min signify possible Chronic Kidney Disease.    Anion gap 10 5 - 15    Comment: Performed at [facility], [place] [address] [place], [place] [address]  CBC     Status: None   Collection Time: 02/24/18  1:30 PM  Result Value Ref Range   WBC 8.4 4.0 - 10.5 K/uL   RBC 4.59 3.87 - 5.11 MIL/uL   Hemoglobin 12.6 12.0 - 15.0 g/dL   HCT 38.4 36.0 - 46.0 %   MCV 83.7 78.0 - 100.0 fL   MCH 27.5 26.0 - 34.0 pg   MCHC 32.8 30.0 - 36.0 g/dL   RDW 13.5 11.5 - 15.5 %   Platelets 232 150 - 400 K/uL    Comment: Performed at [facility], [place] [address] [place], [place] [address]  TSH     Status: None   Collection Time: 02/24/18  1:30 PM  Result Value Ref Range   TSH 4.082 0.350 - 4.500 uIU/mL    Comment: Performed by a 3rd Generation assay with a functional sensitivity of <=0.01 uIU/mL. Performed at [facility], [place] [address], [place], [place] [address]   Magnesium     Status: None   Collection Time: 02/24/18  1:30 PM  Result Value Ref Range   Magnesium 2.2 1.7 - 2.4 mg/dL    Comment: Performed at [facility], [place] [address] [place], [place] [address]  Phosphorus     Status: None   Collection Time: 02/24/18  1:30 PM  Result Value Ref Range   Phosphorus 3.6 2.5 - 4.6 mg/dL    Comment: Performed at [facility], [place] [address] [place], [place]  [address]  Protime-INR     Status: None   Collection Time: 02/24/18  1:30 PM  Result Value Ref Range   Prothrombin Time 12.7 11.4 - 15.2 seconds   INR 0.96     Comment: Performed at [facility], [place] [address] [place], [place] [address]  Differential     Status: None   Collection Time: 02/24/18  1:30 PM  Result Value Ref Range   Neutrophils Relative % 68 %  Neutro Abs 5.7 1.7 - 7.7 K/uL   Lymphocytes Relative 22 %   Lymphs Abs 1.8 0.7 - 4.0 K/uL   Monocytes Relative 6 %   Monocytes Absolute 0.5 0.1 - 1.0 K/uL   Eosinophils Relative 3 %   Eosinophils Absolute 0.3 0.0 - 0.7 K/uL   Basophils Relative 1 %   Basophils Absolute 0.1 0.0 - 0.1 K/uL    Comment: Performed at [facility], [place] [address], [place], [place] [address]  Ethanol     Status: None   Collection Time: 02/24/18  2:13 PM  Result Value Ref Range   Alcohol, Ethyl (B) <10 <10 mg/dL    Comment: (NOTE) Lowest detectable limit for serum alcohol is 10 mg/dL. For medical purposes only. Performed at [facility], [place] [address] [place], [place] [address]   I-stat troponin, ED     Status: None   Collection Time: 02/24/18  2:23 PM  Result Value Ref Range   Troponin i, poc 0.02 0.00 - 0.08 ng/mL   Comment 3            Comment: Due to the release kinetics of cTnI, a negative result within the first hours of the onset of symptoms does not rule out myocardial infarction with certainty. If myocardial infarction is still suspected, repeat the test at appropriate intervals.   Urine rapid drug screen (hosp performed)     Status: Abnormal   Collection Time: 02/24/18  2:36 PM  Result Value Ref Range   Opiates (A) NONE DETECTED    Result not available. Reagent lot number recalled by manufacturer.   Cocaine NONE DETECTED NONE DETECTED   Benzodiazepines NONE DETECTED NONE DETECTED   Amphetamines NONE DETECTED NONE DETECTED   Tetrahydrocannabinol NONE DETECTED NONE  DETECTED   Barbiturates NONE DETECTED NONE DETECTED    Comment: (NOTE) DRUG SCREEN FOR MEDICAL PURPOSES ONLY.  IF CONFIRMATION IS NEEDED FOR ANY PURPOSE, NOTIFY LAB WITHIN 5 DAYS. LOWEST DETECTABLE LIMITS FOR URINE DRUG SCREEN Drug Class                     Cutoff (ng/mL) Amphetamine and metabolites    1000 Barbiturate and metabolites    200 Benzodiazepine                 098 Tricyclics and metabolites     300 Opiates and metabolites        300 Cocaine and metabolites        300 THC                            50 Performed at [facility], [place] [address], [place], [place] [address]    No results found.  Assessment: 72 y.o. female presenting with subacute ischemic infarction involving the right superior frontoparietal junction with an additional right insula punctate focus of signal abnormality.  1. Possible mechanisms include artery to artery embolization, cardioembolic stroke and in situ thrombosis.  Stroke Risk Factors - DM, prior strokes as seen on MRI brain  Plan: 1. HgbA1c, fasting lipid panel 2. MRA of the brain without contrast 3. PT consult, OT consult, Speech consult 4. Echocardiogram 5. Carotid dopplers 6. Prophylactic therapy-Agree with starting ASA 7. Risk factor modification 8. Telemetry monitoring 9. Frequent neuro checks 10. BP management. She is out of the permissive HTN time window 11. Start atorvastatin 40 mg po qd if LFTs and CK are  normal 12. Transfer to [facility] for stroke team to follow   '@Electronically'$  signed: Dr. Kerney Elbe  02/24/2018, 3:09 PM

## 2018-02-24 NOTE — ED Notes (Signed)
Patient transported to MRI 

## 2018-02-24 NOTE — ED Triage Notes (Addendum)
Patient c/o left arm numbness x 2 days and hypertension. Patient was at [facility] today and was told to come to the ED.  Patient obtained a skin tear to the left forearm. Skin tear occurred when the daughter was helping the patient out of the car.

## 2018-02-24 NOTE — ED Notes (Signed)
Called [facility] to set up transport to [facility]

## 2018-02-24 NOTE — H&P (Signed)
History and Physical  [name] QIH:[id number] DOB: 22-Apr-1946 DOA: 02/24/2018  Referring physician: Julianne Rice, ER physician PCP: Lucille Passy, MD  Outpatient Specialists: None Patient coming from: Home & is able to ambulate without assistance normally  Chief Complaint: Left upper extremity weakness  HPI: [name] is a 72 y.o. female with medical history significant for moderately well-controlled diabetes mellitus and difficulty controlling her hypertension plus stage II chronic kidney disease who presented to the emergency room after being sent over from her primary care doctor with left upper extremity weakness and elevated blood pressure.  Patient states symptoms started 3 days ago where her left arm felt heavy and weak and clumsy.  She also noted that her blood pressure was much higher.  No headache no other symptoms such as trouble speaking, confusion, left upper extremity weakness.  She did however feel tired she has for the past month and is just overall not been feeling well.  She felt like her arm strength is actually getting better today and she went to go see her PCP.  When it was noted that she had markedly elevated blood pressure with a systolic greater than 875, she was sent over to the emergency room. ED Course: In the emergency room, patient blood pressure was indeed with a systolic over 643.  CT scan of the head was unremarkable.  Neurology was consulted who recommended stroke work-up.  MRI was noteworthy for a acute/early subacute infarct in the right superior frontoparietal junction with an additional punctate focus within the right insula.  No associated hemorrhage or mass-effect.  She was also noted to have a number of previous small infarction within the left cerebellar hemisphere right anterior corona radiata and bilateral lentiform nuclei.  Patient given labetalol to get blood pressure down below 200 which briefly did help, but since then pressure is since  risen.  Hospitalist were called for further evaluation  Review of Systems: Patient seen in the emergency room. Pt complains of some fatigue as well as left upper extremity weakness and fine motor control  Pt denies any headaches, vision changes, dysphagia, chest pain, palpitations, shortness of breath, wheeze, cough, abdominal pain, hematuria, dysuria, constipation, diarrhea, focal extremity numbness weakness or pain other than described above.  Review of systems is otherwise negative.  Review of systems are otherwise negative   Past Medical History:  Diagnosis Date  . Allergy   . Arthritis   . Cataract   . Diabetes mellitus without complication [facility])    Past Surgical History:  Procedure Laterality Date  . callous removal  Left 2017   located on 1st digit on L foot 2/2 diabetes    Social History:  reports that she has never smoked. She has never used smokeless tobacco. She reports that she does not drink alcohol or use drugs.   Allergies  Allergen Reactions  . Clonidine Anaphylaxis, Swelling and Other (See Comments)    Tongue swelling  . Codeine Itching and Rash    Family History  Problem Relation Age of Onset  . Kidney disease Mother   . Congestive Heart Failure Father   . Diabetes Maternal Aunt       Prior to Admission medications   Medication Sig Start Date End Date Taking? Authorizing Provider  Aflibercept (EYLEA) 2 MG/0.05ML SOLN 1 each by Intravitreal route every 3 (three) months.    Yes Sherlynn Stalls, MD  ARNICA EX Take 1 tablet by mouth daily as needed (For pain.).    Yes  [provider]  diclofenac sodium (VOLTAREN) 1 % GEL Apply 2 g topically 4 (four) times daily. Patient taking differently: Apply 2 g topically 4 (four) times daily as needed (For pain.).  09/02/17  Yes Lucille Passy, MD  fluticasone [facility]) 50 MCG/ACT nasal spray Place 2 sprays into both nostrils daily.  08/11/17  Yes [provider]  glucose blood (PRECISION QID TEST) test  strip 1 each by Other route as needed (For blood sugar.).  07/02/16  Yes [provider]  lisinopril (PRINIVIL,ZESTRIL) 5 MG tablet Take 1/2 tablet daily Patient taking differently: Take 2.5 mg by mouth daily.  09/26/17  Yes Lucille Passy, MD  loratadine (CLARITIN) 10 MG tablet Take 10 mg by mouth daily.  12/03/16  Yes [provider]  metFORMIN (GLUCOPHAGE) 500 MG tablet Take 500 mg by mouth 2 (two) times daily with a meal.  01/31/16 02/24/18 Yes [provider]  TURMERIC PO Take 1 tablet by mouth daily.   Yes [provider]    Physical Exam: BP (!) 218/82   Pulse 87   Temp 97.7 F (36.5 C) (Oral)   Resp 18   Ht 5' 3.25" (1.607 m)   Wt 71.3 kg   SpO2 97%   BMI 27.62 kg/m   General: Alert and oriented x3, no acute distress HEENT: Normocephalic and atraumatic, mucous membranes are slightly dry Neck: Supple, no JVD, no carotid bruits Cardiovascular: Regular rate and rhythm, S1-S2 Respiratory: Clear to auscultation bilaterally Abdomen: Soft, nontender, nondistended, positive bowel sounds Skin: No skin breaks, tears or lesions Musculoskeletal: No clubbing or cyanosis or edema.  See below Psychiatric: Appropriate, no evidence of psychoses Neurologic: Cranial nerves II through XII are intact.  Downgoing toes bilaterally.  Normal finger-to-nose with right upper extremity.  Grip, flexion and extension for right upper extremity and bilateral lower extremities is normal at 5/5.  Patient is very clumsy with no fine motor movement of left upper extremity with approximately 3+/5 in terms of grip, flexion and extension          Labs on Admission:  Basic Metabolic Panel: Recent Labs  Lab 02/18/18 0930 02/24/18 1330  NA 141 144  K 4.3 4.0  CL 108 107  CO2 25 27  GLUCOSE 178* 167*  BUN 19 31*  CREATININE 1.70* 1.68*  CALCIUM 9.5 10.1  MG  --  2.2  PHOS  --  3.6   Liver Function Tests: No results for input(s): AST, ALT, ALKPHOS, BILITOT, PROT, ALBUMIN  in the last 168 hours. No results for input(s): LIPASE, AMYLASE in the last 168 hours. No results for input(s): AMMONIA in the last 168 hours. CBC: Recent Labs  Lab 02/24/18 1330  WBC 8.4  NEUTROABS 5.7  HGB 12.6  HCT 38.4  MCV 83.7  PLT 232   Cardiac Enzymes: No results for input(s): CKTOTAL, CKMB, CKMBINDEX, TROPONINI in the last 168 hours.  BNP (last 3 results) No results for input(s): BNP in the last 8760 hours.  ProBNP (last 3 results) No results for input(s): PROBNP in the last 8760 hours.  CBG: Recent Labs  Lab 02/24/18 1325  GLUCAP 158*    Radiological Exams on Admission: Mr Virgel Paling DJ Contrast  Result Date: 02/24/2018 CLINICAL DATA:  72 y/o F; 2 days of left arm numbness and history of hypertension. EXAM: MRI HEAD WITHOUT CONTRAST MRA HEAD WITHOUT CONTRAST TECHNIQUE: Multiplanar, multiecho pulse sequences of the brain and surrounding structures were obtained without intravenous contrast. Angiographic images of the  head were obtained using MRA technique without contrast. COMPARISON:  None. FINDINGS: MRI HEAD FINDINGS Brain: Clustered small foci of reduced diffusion compatible with acute/early subacute infarction in the right superior frontoparietal junction spanning 5.5 x 2.7 x 3.7 cm (AP x ML x CC) additional punctate focus within the right insula. No associated hemorrhage or mass effect. Small chronic infarctions are present within the left cerebellar hemisphere, bilateral lentiform nuclei, and the right anterior corona radiata. Several nonspecific T2 FLAIR hyperintensities in subcortical and periventricular white matter are compatible with mild to moderate chronic microvascular ischemic changes for age. Mild to moderate volume loss of the brain. No abnormal susceptibility hypointensity to indicate intracranial hemorrhage. No extra-axial collection, hydrocephalus, focal mass effect, or herniation. Vascular: As below. Skull and upper cervical spine: Normal marrow signal.  Sinuses/Orbits: Negative. Other: Bilateral intra-ocular lens replacement. MRA HEAD FINDINGS Internal carotid arteries:  Patent. Anterior cerebral arteries: Patent. Right A3 tandem segment of moderate to severe stenosis. Middle cerebral arteries: Patent. Proximal right M2 superior division short segment of severe stenosis/near occlusion (series 6, image 110). Anterior communicating artery: Patent. Posterior communicating arteries: Not identified, likely hypoplastic or absent. Posterior cerebral arteries: Patent. Mild right P2 and mild left P1 short segments of stenosis. Basilar artery:  Patent. Vertebral arteries:  Patent. No additional evidence of high-grade stenosis, large vessel occlusion, or aneurysm. IMPRESSION: MRI head: 1. Clustered small foci of acute/early subacute infarction within the right superior frontoparietal junction spanning up to 5.5 cm. Additional punctate infarct in the right insula. No associated hemorrhage or mass effect. 2. Background of multiple small chronic infarctions and cerebellum and basal ganglia. Mild-to-moderate chronic microvascular ischemic changes and volume loss of the brain for age. MRA head: 1. Proximal right M2 superior division short segment of severe stenosis/near occlusion. 2. Tandem segments of moderate to severe stenosis in the right A3. 3. Mild right P2 and left P1 short segments of stenosis. 4. No additional large vessel occlusion, high-grade stenosis, or aneurysm identified. These results were called by telephone at the time of interpretation on 02/24/2018 at 5:29 pm to Dr. Lita Mains, who verbally acknowledged these results. Electronically Signed   By: Kristine Garbe M.D.   On: 02/24/2018 17:32   [name] Contrast  Result Date: 02/24/2018 CLINICAL DATA:  72 y/o F; 2 days of left arm numbness and history of hypertension. EXAM: MRI HEAD WITHOUT CONTRAST MRA HEAD WITHOUT CONTRAST TECHNIQUE: Multiplanar, multiecho pulse sequences of the brain and  surrounding structures were obtained without intravenous contrast. Angiographic images of the head were obtained using MRA technique without contrast. COMPARISON:  None. FINDINGS: MRI HEAD FINDINGS Brain: Clustered small foci of reduced diffusion compatible with acute/early subacute infarction in the right superior frontoparietal junction spanning 5.5 x 2.7 x 3.7 cm (AP x ML x CC) additional punctate focus within the right insula. No associated hemorrhage or mass effect. Small chronic infarctions are present within the left cerebellar hemisphere, bilateral lentiform nuclei, and the right anterior corona radiata. Several nonspecific T2 FLAIR hyperintensities in subcortical and periventricular white matter are compatible with mild to moderate chronic microvascular ischemic changes for age. Mild to moderate volume loss of the brain. No abnormal susceptibility hypointensity to indicate intracranial hemorrhage. No extra-axial collection, hydrocephalus, focal mass effect, or herniation. Vascular: As below. Skull and upper cervical spine: Normal marrow signal. Sinuses/Orbits: Negative. Other: Bilateral intra-ocular lens replacement. MRA HEAD FINDINGS Internal carotid arteries:  Patent. Anterior cerebral arteries: Patent. Right A3 tandem segment of moderate to severe stenosis. Middle cerebral arteries:  Patent. Proximal right M2 superior division short segment of severe stenosis/near occlusion (series 6, image 110). Anterior communicating artery: Patent. Posterior communicating arteries: Not identified, likely hypoplastic or absent. Posterior cerebral arteries: Patent. Mild right P2 and mild left P1 short segments of stenosis. Basilar artery:  Patent. Vertebral arteries:  Patent. No additional evidence of high-grade stenosis, large vessel occlusion, or aneurysm. IMPRESSION: MRI head: 1. Clustered small foci of acute/early subacute infarction within the right superior frontoparietal junction spanning up to 5.5 cm. Additional  punctate infarct in the right insula. No associated hemorrhage or mass effect. 2. Background of multiple small chronic infarctions and cerebellum and basal ganglia. Mild-to-moderate chronic microvascular ischemic changes and volume loss of the brain for age. MRA head: 1. Proximal right M2 superior division short segment of severe stenosis/near occlusion. 2. Tandem segments of moderate to severe stenosis in the right A3. 3. Mild right P2 and left P1 short segments of stenosis. 4. No additional large vessel occlusion, high-grade stenosis, or aneurysm identified. These results were called by telephone at the time of interpretation on 02/24/2018 at 5:29 pm to Dr. Lita Mains, who verbally acknowledged these results. Electronically Signed   By: Kristine Garbe M.D.   On: 02/24/2018 17:32    EKG: Independently reviewed.  Sinus rhythm, low voltage, borderline QT prolongation at 404  Assessment/Plan Present on Admission: . Acute CVA (cerebrovascular accident) ([place]) causing acute left upper extremity weakness: Subacute actually given that symptoms started 3 days ago.  Patient already started to have some mild improvement in that arm.  Will have physical therapy evaluate.  Checking lipid panel, A1c.  Passed nursing bedside swallow evaluation.  Have started on daily aspirin.  Checking echocardiogram and carotid Dopplers.  Noted plaque on MRA.  She is also had previous history of CVA which may indicate possible underlying paroxysmal atrial fibrillation.  Neurology on board.  Upper extremity weakness appears to be the only deficit  Diabetes mellitus type 2.  Checking A1c.  Holding metformin on sliding scale while in hospital.  She states she has occasional elevated blood sugars, but never really higher than 170.    Stage III chronic kidney disease:: Pending on A1c, overall CBGs appear relatively controlled and this may be from hypertension.  Currently at baseline.  Essential hypertension: Increased elevated  blood pressure secondary to CVA.  She says her blood pressure is been difficult to control in general.  PRN hydralazine to get blood pressure below 200.  I question if she has underlying heart failure with fluid retention causing resistant blood pressure.  Awaiting echocardiogram.  Overweight: Patient meets criteria with BMI greater than 25.  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  Active Problems:   Acute CVA (cerebrovascular accident) ([place])   DVT prophylaxis: SCDs  Code Status: Full code  Family Communication: Daughter at the bedside   Disposition Plan: potentially home tomorrow if workup complete   Consults called: Full code   Admission status: Given likelihood the patient will be here under 2 midnights, will place under observation    Annita Brod MD [facility] Pager [phone]  If 7PM-7AM, please contact night-coverage [email or web address] Password [facility]  02/24/2018, 7:04 PM

## 2018-02-24 NOTE — Progress Notes (Signed)
Subjective:   Patient ID: [name], female    DOB: Jul 14, 1945, 72 y.o.   MRN: [id number]  [name] is a pleasant 72 y.o. year old female who presents to clinic today with Fatigue (Patient is here today due to feeling fatigued and unwell.  Per E-mails she has been feeling very week and daughter said BP has been 160 and her BS has been high but they have been inconsistent with keeping a check on it.  She has been on Metformin 500mg  bid for 2 years per daughter.  Had Eylia in [place] on 8.13.19  which was the 1st time in 3 months.  Started them a year ago and was at every 6 weeks then go to every 3 months.  Daughter has been reading about this and left arm numbness, weak back as SE.)  on 02/24/2018  HPI: Weakness and feeling unwell-  Established care with me on 09/02/17. Note reviewed.  At that office visit, she was also accompanied by her daughter, [name]. At that time, [name] stated that she checks her BP regularly and states that on amlodipine 2.5 mg daily, never higher than 140/60s.   In reviewing her chart, appears she was on ARB in past.  She was not sure why it was stopped. She states that she does have an allergy to clonidine- tongue swelling.  She has since stopped taking amlodipine and lisinopril and BP has been very elevated.  She stopped them both because medications always make her feel dizzy.  She now has had left arm numbness and weakness for past 3 days.    DM- at that time she told me she was taking Metformin 500 mg  Daily. Today her daughter states that she has been taking  Metformin 500 mg twice daily for 2 years.  Saw [place] on 02/18/18 for cough.  Note reviewed. At that [place] she stated that she still had not made an appointment with renal as I had suggested. Lab Results  Component Value Date   CREATININE 1.70 (H) 02/18/2018    She has been receiving Eyelea for macular degeneration- received it on 02/17/18 which was the first time in 3 months. Daughter is  concerned because possible side effects include weakness and arm numbness.  Lab Results  Component Value Date   HGBA1C 7.3 (H) 09/02/2017     Current Outpatient Medications on File Prior to Visit  Medication Sig Dispense Refill  . Aflibercept (EYLEA) 2 MG/0.05ML SOLN by Intravitreal route.    [place] EX Apply topically.    Marland Kitchen augmented betamethasone dipropionate (DIPROLENE-AF) 0.05 % cream Apply topically.    . diclofenac sodium (VOLTAREN) 1 % GEL Apply 2 g topically 4 (four) times daily. 100 g 0  . fluticasone (FLONASE) 50 MCG/ACT nasal spray 2 sprays by Each Nare route daily.    . Ginger, Zingiber officinalis, (GINGER ROOT PO) Take by mouth.    Marland Kitchen glucose blood (PRECISION QID TEST) test strip Check sugar twice a day to monitor readings    . lisinopril (PRINIVIL,ZESTRIL) 5 MG tablet Take 1/2 tablet daily 90 tablet 0  . loratadine (CLARITIN) 10 MG tablet Take by mouth.    . metFORMIN (GLUCOPHAGE) 500 MG tablet Take 500 mg by mouth 2 (two) times daily with a meal.     . Misc Natural Products (TUMERSAID) TABS Take by mouth.    . NON FORMULARY Take 300 mg by mouth 3 (three) times daily.     No current facility-administered medications  on file prior to visit.     Allergies  Allergen Reactions  . Clonidine Anaphylaxis    Tongue swelling  . Codeine Rash    itching    Past Medical History:  Diagnosis Date  . Allergy   . Arthritis   . Cataract   . Diabetes mellitus without complication [facility])     Past Surgical History:  Procedure Laterality Date  . callous removal  Left 2017   located on 1st digit on L foot 2/2 diabetes    Family History  Problem Relation Age of Onset  . Kidney disease Mother   . Congestive Heart Failure Father   . Diabetes Maternal Aunt     Social History   Socioeconomic History  . Marital status: Single    Spouse name: Not on file  . Number of children: Not on file  . Years of education: Not on file  . Highest education level: Not on file    Occupational History  . Not on file  Social Needs  . Financial resource strain: Not on file  . Food insecurity:    Worry: Not on file    Inability: Not on file  . Transportation needs:    Medical: Not on file    Non-medical: Not on file  Tobacco Use  . Smoking status: Never Smoker  . Smokeless tobacco: Never Used  Substance and Sexual Activity  . Alcohol use: No  . Drug use: No  . Sexual activity: Never  Lifestyle  . Physical activity:    Days per week: Not on file    Minutes per session: Not on file  . Stress: Not on file  Relationships  . Social connections:    Talks on phone: Not on file    Gets together: Not on file    Attends religious service: Not on file    Active member of club or organization: Not on file    Attends meetings of clubs or organizations: Not on file    Relationship status: Not on file  . Intimate partner violence:    Fear of current or ex partner: Not on file    Emotionally abused: Not on file    Physically abused: Not on file    Forced sexual activity: Not on file  Other Topics Concern  . Not on file  Social History Narrative  . Not on file   The PMH, PSH, Social History, Family History, Medications, and allergies have been reviewed in [facility], and have been updated if relevant.   Review of Systems  Respiratory: Negative.   Cardiovascular: Negative.   Musculoskeletal: Negative.   Neurological: Positive for weakness and headaches. Negative for facial asymmetry.  All other systems reviewed and are negative.      Objective:    BP (!) 220/88 (BP Location: Right Arm, Cuff Size: Normal)   Pulse 82   Temp 98.6 F (37 C) (Oral)   Ht 5' 3.25" (1.607 m)   Wt 157 lb 6.4 oz (71.4 kg)   SpO2 96%   BMI 27.66 kg/m    Physical Exam  Constitutional: She appears well-developed and well-nourished. No distress.  Holding left arm  HENT:  Head: Normocephalic.  Eyes: EOM are normal.  Cardiovascular: Normal rate.  Pulmonary/Chest: Effort normal.   Musculoskeletal:  Holding left arm, decreased grip strength  Neurological: She is alert.  Skin: Skin is warm and dry. She is not diaphoretic.  Psychiatric: She has a normal mood and affect. Her behavior is normal. Judgment  and thought content normal.  Nursing note and vitals reviewed.         Assessment & Plan:   Diabetes mellitus without complication ([place]) - Plan: POCT Glucose (Device for Home Use)  Essential hypertension  Weakness No follow-ups on file.

## 2018-02-24 NOTE — ED Notes (Signed)
Patient soiled bed, patient cleaned and pure wick placed. Daughter at bedside.

## 2018-02-24 NOTE — ED Notes (Signed)
Per [place] Pink, Hospitalist, if no beds at [facility] in a timely manner, then house hospitalist should be notified and patient should be moved to a telemetry bed here at [facility].

## 2018-02-24 NOTE — ED Notes (Signed)
1st set of cultures at bedside.

## 2018-02-24 NOTE — Assessment & Plan Note (Signed)
See below

## 2018-02-24 NOTE — Assessment & Plan Note (Signed)
With very elevated blood pressure and possible symptoms of stroke x 3 days.  Advised patient and her daughter to go straight to the ER and I will call charge nurse. Outside window for tpa but still needing ER evaluation and work up for hypertensive urgency/possible stroke. The patient indicates understanding of these issues and agrees with the plan.

## 2018-02-24 NOTE — ED Provider Notes (Signed)
[facility] DEPT Provider Note   CSN: [id number] Arrival date & time: 02/24/18  1230     History   Chief Complaint Chief Complaint  Patient presents with  . Hypertension  . Numbness  . skin tear    HPI [name] is a 72 y.o. female.  HPI 2 days ago, sometime on Saturday the patient noted that her left arm fairly abruptly was not working very well.  It was clumsy and very weak.  It got somewhat better.  She did not seek treatment at that time.  She and her daughter highly suspected that it was a side effect of the EYLEA injections that she gets.  She denied that there were lower extremity symptoms but when asked if she felt slight weakness or dragging of the right leg she endorsed may be slightly.  It did not impede her walking.  Denies any headache, neck pain or shoulder pain.  The arm is still quite clumsy but she can move it more now than she could before.  She has had fairly persistently high blood pressure.  Patient has chosen not to take her lisinopril at least for the past month.  Took 2.5 mg this morning and yesterday. Past Medical History:  Diagnosis Date  . Allergy   . Arthritis   . Cataract   . Diabetes mellitus without complication [facility])     Patient Active Problem List   Diagnosis Date Noted  . Hypertensive urgency 02/24/2018  . Diabetes mellitus without complication ([place]) 10/93/2355  . HTN (hypertension) 09/02/2017  . Fall at home, initial encounter 09/02/2017  . Knee pain, bilateral 09/02/2017  . Weakness 09/02/2017    Past Surgical History:  Procedure Laterality Date  . callous removal  Left 2017   located on 1st digit on L foot 2/2 diabetes     OB History   None      Home Medications    Prior to Admission medications   Medication Sig Start Date End Date Taking? Authorizing Provider  Aflibercept (EYLEA) 2 MG/0.05ML SOLN 1 each by Intravitreal route every 3 (three) months.    Yes Sherlynn Stalls, MD  ARNICA EX  Take 1 tablet by mouth daily as needed (For pain.).    Yes [provider]  diclofenac sodium (VOLTAREN) 1 % GEL Apply 2 g topically 4 (four) times daily. Patient taking differently: Apply 2 g topically 4 (four) times daily as needed (For pain.).  09/02/17  Yes Lucille Passy, MD  fluticasone [facility]) 50 MCG/ACT nasal spray Place 2 sprays into both nostrils daily.  08/11/17  Yes [provider]  glucose blood (PRECISION QID TEST) test strip 1 each by Other route as needed (For blood sugar.).  07/02/16  Yes [provider]  lisinopril (PRINIVIL,ZESTRIL) 5 MG tablet Take 1/2 tablet daily Patient taking differently: Take 2.5 mg by mouth daily.  09/26/17  Yes Lucille Passy, MD  loratadine (CLARITIN) 10 MG tablet Take 10 mg by mouth daily.  12/03/16  Yes [provider]  metFORMIN (GLUCOPHAGE) 500 MG tablet Take 500 mg by mouth 2 (two) times daily with a meal.  01/31/16 02/24/18 Yes [provider]  TURMERIC PO Take 1 tablet by mouth daily.   Yes [provider]    Family History Family History  Problem Relation Age of Onset  . Kidney disease Mother   . Congestive Heart Failure Father   . Diabetes Maternal Aunt     Social History Social History  Tobacco Use  . Smoking status: Never Smoker  . Smokeless tobacco: Never Used  Substance Use Topics  . Alcohol use: No  . Drug use: No     Allergies   Clonidine and Codeine   Review of Systems Review of Systems 10 Systems reviewed and are negative for acute change except as noted in the HPI.  Physical Exam Updated Vital Signs BP (!) 203/73   Pulse 78   Temp 97.7 F (36.5 C) (Oral)   Resp 14   Ht 5' 3.25" (1.607 m)   Wt 71.3 kg   SpO2 94%   BMI 27.62 kg/m   Physical Exam  Constitutional: She is oriented to person, place, and time. She appears well-developed and well-nourished. No distress.  HENT:  Head: Normocephalic and atraumatic.  Mouth/Throat: Oropharynx is clear and moist.    Eyes: Pupils are equal, round, and reactive to light. EOM are normal.  Neck: Neck supple.  Cardiovascular: Normal rate, regular rhythm, normal heart sounds and intact distal pulses.  Pulmonary/Chest: Effort normal.  Fine basilar rales.  Abdominal: Soft. She exhibits no distension. There is no tenderness. There is no guarding.  Musculoskeletal: She exhibits no edema, tenderness or deformity.  Neurological: She is alert and oriented to person, place, and time. No cranial nerve deficit. She exhibits abnormal muscle tone. Coordination abnormal.  Cranial nerves II through XII intact.  Speech is clear.  Content normal.  No Visual field deficit.  Patient cannot get left arm into full extension for pronator drift, it is too weak.  She can hold it slightly extended and in limb position.  Grip strength on the left is 2\5.  Right is normal.  Bilateral lower extremities can elevate and hold against resistance.  Very questionable if there is slight difference in strength of the left lower extremity.  Good dorsa flexion and extension.  Skin: Skin is warm and dry.  Psychiatric: She has a normal mood and affect.     ED Treatments / Results  Labs (all labs ordered are listed, but only abnormal results are displayed) Labs Reviewed  BASIC METABOLIC PANEL - Abnormal; Notable for the following components:      Result Value   Glucose, Bld 167 (*)    BUN 31 (*)    Creatinine, Ser 1.68 (*)    GFR calc non Af Amer 29 (*)    GFR calc Af Amer 34 (*)    All other components within normal limits  RAPID URINE DRUG SCREEN, HOSP PERFORMED - Abnormal; Notable for the following components:   Opiates   (*)    Value: Result not available. Reagent lot number recalled by manufacturer.   All other components within normal limits  CBG MONITORING, ED - Abnormal; Notable for the following components:   Glucose-Capillary 158 (*)    All other components within normal limits  CBC  MAGNESIUM  PHOSPHORUS  ETHANOL   PROTIME-INR  DIFFERENTIAL  URINALYSIS, ROUTINE W REFLEX MICROSCOPIC  TSH  APTT  I-STAT TROPONIN, ED    EKG EKG Interpretation  Date/Time:  Tuesday February 24 2018 13:30:22 EDT Ventricular Rate:  87 PR Interval:    QRS Duration: 81 QT Interval:  404 QTC Calculation: 486 R Axis:   67 Text Interpretation:  Sinus rhythm Low voltage, precordial leads Borderline prolonged QT interval normal. no old  Confirmed by Charlesetta Shanks [phone]) on 02/24/2018 3:05:04 PM   Radiology No results found.  Procedures Procedures (including critical care time) CRITICAL CARE Performed by: Charlesetta Shanks  Total critical care time: 30  minutes  Critical care time was exclusive of separately billable procedures and treating other patients.  Critical care was necessary to treat or prevent imminent or life-threatening deterioration.  Critical care was time spent personally by me on the following activities: development of treatment plan with patient and/or surrogate as well as nursing, discussions with consultants, evaluation of patient's response to treatment, examination of patient, obtaining history from patient or surrogate, ordering and performing treatments and interventions, ordering and review of laboratory studies, ordering and review of radiographic studies, pulse oximetry and re-evaluation of patient's condition. Medications Ordered in ED Medications  labetalol (NORMODYNE,TRANDATE) injection 10 mg (10 mg Intravenous Given 02/24/18 1442)     Initial Impression / Assessment and Plan / ED Course  I have reviewed the triage vital signs and the nursing notes.  Pertinent labs & imaging results that were available during my care of the patient were reviewed by me and considered in my medical decision making (see chart for details).  Clinical Course as of Feb 25 1504  Tue Feb 24, 2018  1502 Reviewed patient's case with Dr. Cheral Marker.  He advises to proceed with MRI MRA.  If positive for stroke,  plan to transfer the patient to [facility].  Advises to treat the patient's hypertension with a goal of approximately 333 systolic.   [MP]    Clinical Course User Index [MP] Charlesetta Shanks, MD   Patient is alert and appropriate.  She has no respiratory distress.  No mental status changes.  Exam is for focal left upper extremity weakness.  I do have significant suspicion for CVA.  This occurred approximately 2 days ago.  Per consultation with neurology will per site with MRI MRA.  Patient also has hypertensive urgency.  Systolic pressures have been 220 or greater.  Per consultation with Dr. Cheral Marker patient was given labetalol with goal systolic pressure at approximately 200.  She does not show signs of being encephalopathic.  She has been noncompliant with antihypertensive medications.  If patient's MRI shows stroke, plan will be for transfer to [facility].  Patient will likely require admission for further management of hypertension and diagnostic evaluation.  Final Clinical Impressions(s) / ED Diagnoses   Final diagnoses:  Left arm weakness  Hypertensive urgency    ED Discharge Orders    None       Charlesetta Shanks, MD 02/24/18 [phone]

## 2018-02-25 ENCOUNTER — Observation Stay (HOSPITAL_COMMUNITY): Payer: Medicare Other

## 2018-02-25 DIAGNOSIS — E1151 Type 2 diabetes mellitus with diabetic peripheral angiopathy without gangrene: Secondary | ICD-10-CM | POA: Diagnosis present

## 2018-02-25 DIAGNOSIS — I6349 Cerebral infarction due to embolism of other cerebral artery: Secondary | ICD-10-CM | POA: Diagnosis not present

## 2018-02-25 DIAGNOSIS — N183 Chronic kidney disease, stage 3 (moderate): Secondary | ICD-10-CM

## 2018-02-25 DIAGNOSIS — I639 Cerebral infarction, unspecified: Secondary | ICD-10-CM

## 2018-02-25 DIAGNOSIS — Z79899 Other long term (current) drug therapy: Secondary | ICD-10-CM | POA: Diagnosis not present

## 2018-02-25 DIAGNOSIS — Z6827 Body mass index (BMI) 27.0-27.9, adult: Secondary | ICD-10-CM | POA: Diagnosis not present

## 2018-02-25 DIAGNOSIS — Z885 Allergy status to narcotic agent status: Secondary | ICD-10-CM | POA: Diagnosis not present

## 2018-02-25 DIAGNOSIS — E1165 Type 2 diabetes mellitus with hyperglycemia: Secondary | ICD-10-CM | POA: Diagnosis present

## 2018-02-25 DIAGNOSIS — Z8673 Personal history of transient ischemic attack (TIA), and cerebral infarction without residual deficits: Secondary | ICD-10-CM | POA: Diagnosis not present

## 2018-02-25 DIAGNOSIS — E119 Type 2 diabetes mellitus without complications: Secondary | ICD-10-CM | POA: Diagnosis not present

## 2018-02-25 DIAGNOSIS — E11311 Type 2 diabetes mellitus with unspecified diabetic retinopathy with macular edema: Secondary | ICD-10-CM | POA: Diagnosis present

## 2018-02-25 DIAGNOSIS — I161 Hypertensive emergency: Secondary | ICD-10-CM | POA: Diagnosis present

## 2018-02-25 DIAGNOSIS — Z7984 Long term (current) use of oral hypoglycemic drugs: Secondary | ICD-10-CM | POA: Diagnosis not present

## 2018-02-25 DIAGNOSIS — R29898 Other symptoms and signs involving the musculoskeletal system: Secondary | ICD-10-CM

## 2018-02-25 DIAGNOSIS — I10 Essential (primary) hypertension: Secondary | ICD-10-CM

## 2018-02-25 DIAGNOSIS — I6340 Cerebral infarction due to embolism of unspecified cerebral artery: Secondary | ICD-10-CM | POA: Diagnosis present

## 2018-02-25 DIAGNOSIS — E1122 Type 2 diabetes mellitus with diabetic chronic kidney disease: Secondary | ICD-10-CM | POA: Diagnosis present

## 2018-02-25 DIAGNOSIS — Z888 Allergy status to other drugs, medicaments and biological substances status: Secondary | ICD-10-CM | POA: Diagnosis not present

## 2018-02-25 DIAGNOSIS — R29706 NIHSS score 6: Secondary | ICD-10-CM | POA: Diagnosis present

## 2018-02-25 DIAGNOSIS — I5030 Unspecified diastolic (congestive) heart failure: Secondary | ICD-10-CM

## 2018-02-25 DIAGNOSIS — I129 Hypertensive chronic kidney disease with stage 1 through stage 4 chronic kidney disease, or unspecified chronic kidney disease: Secondary | ICD-10-CM | POA: Diagnosis present

## 2018-02-25 DIAGNOSIS — E663 Overweight: Secondary | ICD-10-CM | POA: Diagnosis present

## 2018-02-25 DIAGNOSIS — G8194 Hemiplegia, unspecified affecting left nondominant side: Secondary | ICD-10-CM | POA: Diagnosis present

## 2018-02-25 DIAGNOSIS — I69354 Hemiplegia and hemiparesis following cerebral infarction affecting left non-dominant side: Secondary | ICD-10-CM | POA: Diagnosis not present

## 2018-02-25 LAB — GLUCOSE, CAPILLARY
GLUCOSE-CAPILLARY: 142 mg/dL — AB (ref 70–99)
GLUCOSE-CAPILLARY: 168 mg/dL — AB (ref 70–99)
Glucose-Capillary: 205 mg/dL — ABNORMAL HIGH (ref 70–99)
Glucose-Capillary: 167 mg/dL — ABNORMAL HIGH (ref 70–99)

## 2018-02-25 MED ORDER — HYDRALAZINE HCL 20 MG/ML IJ SOLN
10.0000 mg | Freq: Once | INTRAMUSCULAR | Status: AC
  Administered 2018-02-25: 10 mg via INTRAVENOUS
  Filled 2018-02-25: qty 1

## 2018-02-25 MED ORDER — ASPIRIN EC 81 MG PO TBEC
81.0000 mg | DELAYED_RELEASE_TABLET | Freq: Every day | ORAL | Status: DC
  Administered 2018-02-25 – 2018-02-27 (×3): 81 mg via ORAL
  Filled 2018-02-25 (×3): qty 1

## 2018-02-25 MED ORDER — CLOPIDOGREL BISULFATE 75 MG PO TABS
75.0000 mg | ORAL_TABLET | Freq: Every day | ORAL | Status: DC
  Administered 2018-02-25 – 2018-02-27 (×3): 75 mg via ORAL
  Filled 2018-02-25 (×3): qty 1

## 2018-02-25 MED ORDER — HYDRALAZINE HCL 20 MG/ML IJ SOLN
INTRAMUSCULAR | Status: AC
  Administered 2018-02-25: 20 mg
  Filled 2018-02-25: qty 1

## 2018-02-25 MED ORDER — PHENOL 1.4 % MT LIQD
1.0000 | OROMUCOSAL | Status: DC | PRN
  Administered 2018-02-25: 1 via OROMUCOSAL
  Filled 2018-02-25: qty 177

## 2018-02-25 NOTE — Evaluation (Signed)
Occupational Therapy Evaluation Patient Details Name: [name] MRN: [id number] DOB: 1946/03/21 Today's Date: 02/25/2018    History of Present Illness 72 y.o. female who presented to the ED from her PCP's office with LUE weakness and elevated BP. 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    Clinical Impression   Pt had been requiring more assistance for ADL and IADL in the weeks leading up to admission, but at her baseline she is independent. Pt presents with R side weakness and incoordination with impaired sensation. She demonstrates impaired sitting and standing balance and requires min assist for all mobility. Pt is pleasant, highly motivated and has excellent family support for intensive rehab. Recommending CIR. Will follow acutely.    Follow Up Recommendations  CIR    Equipment Recommendations       Recommendations for Other Services       Precautions / Restrictions Precautions Precautions: Fall      Mobility Bed Mobility Overal bed mobility: Needs Assistance Bed Mobility: Supine to Sit     Supine to sit: Min assist     General bed mobility comments: signficant time to perform, HOB elevated and max multi modal cues to turn to side with physical assist for elevation of trunk to upright.   Transfers Overall transfer level: Needs assistance Equipment used: 1 person hand held assist Transfers: Sit to/from Stand Sit to Stand: Min assist         General transfer comment: Min assist to power to upright, with blocking of LLE and Vcs for positioning.     Balance Overall balance assessment: Needs assistance   Sitting balance-Leahy Scale: Poor Sitting balance - Comments: difficulty maintaining midline, posterior lean with any dynamic movement, increased time to correct Postural control: Posterior lean Standing balance  support: Single extremity supported Standing balance-Leahy Scale: Poor Standing balance comment: relaince on UE support in standing, decreased attention to left side                           ADL either performed or assessed with clinical judgement   ADL Overall ADL's : Needs assistance/impaired Eating/Feeding: Minimal assistance;Sitting Eating/Feeding Details (indicate cue type and reason): assist to set up tray Grooming: Wash/dry hands;Standing;Minimal assistance Grooming Details (indicate cue type and reason): pt needing assist to bring L UE into sink Upper Body Bathing: Moderate assistance;Sitting   Lower Body Bathing: Moderate assistance;Sit to/from stand   Upper Body Dressing : Moderate assistance;Sitting   Lower Body Dressing: Moderate assistance   Toilet Transfer: Minimal assistance;Ambulation   Toileting- Clothing Manipulation and Hygiene: Moderate assistance;Sit to/from stand       Functional mobility during ADLs: Minimal assistance General ADL Comments: hand held assist to ambulate in room     Vision Patient Visual Report: No change from baseline Additional Comments: appears grossly intact, pt with difficulty complying with requirements of formal assessment     Perception     Praxis      Pertinent Vitals/Pain Pain Assessment: No/denies pain     Hand Dominance Right   Extremity/Trunk Assessment Upper Extremity Assessment Upper Extremity Assessment: LUE deficits/detail LUE Deficits / Details: Noted LUE weakness gross motions 3+/proximal; < 3/5 distally with no active digit extension at this time LUE Sensation: decreased light touch;decreased proprioception LUE Coordination: decreased fine motor;decreased gross motor   Lower Extremity Assessment Lower Extremity Assessment: Defer  to PT evaluation LLE Deficits / Details: good strength upon assessment, but poor functional awareness during task performance/ambulation LLE Sensation: decreased  proprioception LLE Coordination: decreased fine motor;decreased gross motor       Communication Communication Communication: No difficulties   Cognition Arousal/Alertness: Awake/alert Behavior During Therapy: WFL for tasks assessed/performed Overall Cognitive Status: Impaired/Different from baseline Area of Impairment: Attention;Problem solving                   Current Attention Level: Alternating         Problem Solving: Requires verbal cues;Requires tactile cues General Comments: decreased awareness/ attention to left side but can attend with cues.   General Comments       Exercises     Shoulder Instructions      Home Living Family/patient expects to be discharged to:: Private residence Living Arrangements: Spouse/significant other;Children Available Help at Discharge: Family Type of Home: House Home Access: Stairs to enter Technical brewer of Steps: 2   Home Layout: One level     Bathroom Shower/Tub: Hospital doctor Toilet: Handicapped height     Home Equipment: Environmental consultant - 2 wheels;Cane - single point          Prior Functioning/Environment Level of Independence: Independent                 OT Problem List: Decreased strength;Decreased activity tolerance;Impaired balance (sitting and/or standing);Decreased coordination;Decreased cognition;Decreased knowledge of use of DME or AE;Impaired UE functional use      OT Treatment/Interventions: Self-care/ADL training;DME and/or AE instruction;Balance training;Patient/family education;Cognitive remediation/compensation;Therapeutic activities;Neuromuscular education    OT Goals(Current goals can be found in the care plan section) Acute Rehab OT Goals Patient Stated Goal: to get back to complete independence OT Goal Formulation: With patient Time For Goal Achievement: 03/11/18 Potential to Achieve Goals: Good ADL Goals Pt Will Perform Eating: (P) with modified  independence;sitting Pt Will Perform Grooming: (P) with min assist;standing Pt Will Perform Upper Body Dressing: (P) with supervision;sitting Pt Will Perform Lower Body Dressing: (P) with set-up;sit to/from stand Pt Will Transfer to Toilet: (P) with supervision;ambulating;regular height toilet Pt Will Perform Toileting - Clothing Manipulation and hygiene: (P) with supervision;sit to/from stand Pt/caregiver will Perform Home Exercise Program: (P) Left upper extremity;With written HEP provided;With Supervision Additional ADL Goal #1: (P) Pt will be independent in bed mobility in preparation for ADL.  OT Frequency: Min 3X/week   Barriers to D/C:            Co-evaluation              AM-PAC PT "6 Clicks" Daily Activity     Outcome Measure Help from another person eating meals?: A Little Help from another person taking care of personal grooming?: A Lot Help from another person toileting, which includes using toliet, bedpan, or urinal?: A Lot Help from another person bathing (including washing, rinsing, drying)?: A Lot Help from another person to put on and taking off regular upper body clothing?: A Lot Help from another person to put on and taking off regular lower body clothing?: A Lot 6 Click Score: 13   End of Session Equipment Utilized During Treatment: Gait belt Nurse Communication: Mobility status  Activity Tolerance: Patient tolerated treatment well Patient left: in chair;with call bell/phone within reach;with family/visitor present;with nursing/sitter in room  OT Visit Diagnosis: Other abnormalities of gait and mobility (R26.89);Muscle weakness (generalized) (M62.81);Other symptoms and signs involving cognitive function;Hemiplegia and hemiparesis Hemiplegia - Right/Left: Left Hemiplegia - dominant/non-dominant: Non-Dominant  Hemiplegia - caused by: Cerebral infarction                Time: 2263-3354 OT Time Calculation (min): 23 min Charges:  OT General Charges $OT  Visit: 1 Visit OT Evaluation $OT Eval Moderate Complexity: 1 Mod  Malka So 02/25/2018, 9:57 AM  02/25/2018 Nestor Lewandowsky, OTR/L Pager: [phone]

## 2018-02-25 NOTE — Progress Notes (Signed)
    [facility] has been requested to perform a transesophageal echocardiogram on [name] for stroke. Upon my arrival to the room to explain the procedure that was scheduled for tomorrow at 8:00, the patient and her daughter informed me that the patient does not want the procedure and it has been cancelled. They are not interested in talking about it.    Daune Perch, NP  02/25/2018 4:00 PM

## 2018-02-25 NOTE — Consult Note (Signed)
Physical Medicine and Rehabilitation Consult Reason for Consult: Left side weakness Referring Physician: [facility]   HPI: [name] is a 72 y.o. right-handed female with history of diabetes mellitus, hypertension, CKD stage III.  History taken from chart review, patient, and daughter. Lives with spouse and daughter.  Reportedly independent prior to admission.  One level home with 2 steps to entry.  Son-in-law is a cardiologist in [place].  Presented 02/24/2018 left side weakness as well as systolic blood pressure in the 200s.  MRI reviewed, showing right CVAs. Per report, clustered small foci of acute early subacute infarction within the right superior frontoparietal junction.  Additional punctate infarct in the right insula.  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  MRA showed proximal right M2 superior division short segment of severe stenosis near occlusion no additional large vessel occlusion identified.  Echocardiogram is pending.  Await plan for TEE possible loop recorder.  Currently on aspirin and Plavix for CVA prophylaxis.  Therapy evaluations completed with recommendations of physical medicine rehab consult.  Review of Systems  Constitutional: Negative for chills and fever.  HENT: Negative for hearing loss.   Eyes: Negative for blurred vision and double vision.  Respiratory: Negative for cough and shortness of breath.   Cardiovascular: Negative for chest pain and palpitations.  Gastrointestinal: Positive for constipation. Negative for nausea.  Genitourinary: Negative for dysuria, flank pain and hematuria.  Musculoskeletal: Positive for joint pain and myalgias.  Skin: Negative for rash.  Neurological: Positive for focal weakness. Negative for speech change.  All other systems reviewed and are negative.  Past Medical History:  Diagnosis Date  . Allergy   . Arthritis   . Cataract   . Diabetes mellitus without complication [facility])     Past Surgical History:  Procedure Laterality Date  . callous removal  Left 2017   located on 1st digit on L foot 2/2 diabetes   Family History  Problem Relation Age of Onset  . Kidney disease Mother   . Congestive Heart Failure Father   . Diabetes Maternal Aunt    Social History:  reports that she has never smoked. She has never used smokeless tobacco. She reports that she does not drink alcohol or use drugs. Allergies:  Allergies  Allergen Reactions  . Clonidine Anaphylaxis, Swelling and Other (See Comments)    Tongue swelling  . Codeine Itching and Rash   Medications Prior to Admission  Medication Sig Dispense Refill  . Aflibercept (EYLEA) 2 MG/0.05ML SOLN 1 each by Intravitreal route every 3 (three) months.     [place] EX Take 1 tablet by mouth daily as needed (For pain.).     Marland Kitchen diclofenac sodium (VOLTAREN) 1 % GEL Apply 2 g topically 4 (four) times daily. (Patient taking differently: Apply 2 g topically 4 (four) times daily as needed (For pain.). ) 100 g 0  . fluticasone (FLONASE) 50 MCG/ACT nasal spray Place 2 sprays into both nostrils daily.     Marland Kitchen glucose blood (PRECISION QID TEST) test strip 1 each by Other route as needed (For blood sugar.).     Marland Kitchen lisinopril (PRINIVIL,ZESTRIL) 5 MG tablet Take 1/2 tablet daily (Patient taking differently: Take 2.5 mg by mouth daily. ) 90 tablet 0  . loratadine (CLARITIN) 10 MG tablet Take 10 mg by mouth daily.     . metFORMIN (GLUCOPHAGE) 500 MG tablet Take 500 mg by mouth 2 (two) times daily with a meal.     .  TURMERIC PO Take 1 tablet by mouth daily.      Home: Home Living Family/patient expects to be discharged to:: Private residence Living Arrangements: Spouse/significant other, Children Available Help at Discharge: Family Type of Home: House Home Access: Stairs to enter Technical brewer of Steps: 2 [place]: One level Bathroom Shower/Tub: Walk-in Radio producer: Handicapped height [place]: Environmental consultant -  2 wheels, [employer] - single point  Functional History: Prior Function Level of Independence: Independent Functional Status:  Mobility: Bed Mobility Overal bed mobility: Needs Assistance Bed Mobility: Supine to Sit Supine to sit: Min assist General bed mobility comments: signficant time to perform, HOB elevated and max multi modal cues to turn to side with physical assist for elevation of trunk to upright.  Transfers Overall transfer level: Needs assistance Equipment used: 1 person hand held assist Transfers: Sit to/from Stand Sit to Stand: Min assist General transfer comment: Min assist to power to upright, with blocking of LLE and Vcs for positioning.  Ambulation/Gait Ambulation/Gait assistance: Min assist, +2 physical assistance Gait Distance (Feet): 18 Feet Assistive device: 2 person hand held assist Gait Pattern/deviations: Step-to pattern, Decreased stride length, Decreased weight shift to left General Gait Details: patient with difficulty shifting weight for LE advancement, noted LLE lag and heavy list/reliance to the right. Assist provided. Poor awareness of left UE. Gait velocity: decreased    ADL: ADL Overall ADL's : Needs assistance/impaired Eating/Feeding: Minimal assistance, Sitting Eating/Feeding Details (indicate cue type and reason): assist to set up tray Grooming: Wash/dry hands, Standing, Minimal assistance Grooming Details (indicate cue type and reason): pt needing assist to bring L UE into sink Upper Body Bathing: Moderate assistance, Sitting Lower Body Bathing: Moderate assistance, Sit to/from stand Upper Body Dressing : Moderate assistance, Sitting Lower Body Dressing: Moderate assistance Toilet Transfer: Minimal assistance, Ambulation Toileting- Clothing Manipulation and Hygiene: Moderate assistance, Sit to/from stand Functional mobility during ADLs: Minimal assistance General ADL Comments: hand held assist to ambulate in  room  Cognition: Cognition Overall Cognitive Status: Impaired/Different from baseline Orientation Level: Oriented X4 Cognition Arousal/Alertness: Awake/alert Behavior During Therapy: WFL for tasks assessed/performed Overall Cognitive Status: Impaired/Different from baseline Area of Impairment: Attention, Problem solving Current Attention Level: Alternating Problem Solving: Requires verbal cues, Requires tactile cues General Comments: decreased awareness/ attention to left side but can attend with cues.  Blood pressure (!) 176/57, pulse 83, temperature 99.2 F (37.3 C), temperature source Oral, resp. rate 18, height 5' 3.25" (1.607 m), weight 71.3 kg, SpO2 97 %. Physical Exam  Vitals reviewed. Constitutional: She is oriented to person, place, and time. She appears well-developed and well-nourished.  HENT:  Head: Normocephalic and atraumatic.  Eyes: EOM are normal. Right eye exhibits no discharge. Left eye exhibits no discharge.  Neck: Normal range of motion. Neck supple. No thyromegaly present.  Cardiovascular: Normal rate, regular rhythm and normal heart sounds.  Respiratory: Effort normal and breath sounds normal. No respiratory distress.  GI: Soft. Bowel sounds are normal. She exhibits no distension.  Musculoskeletal:  No edema or tenderness in extremities  Neurological: She is alert and oriented to person, place, and time.  Follows commands.   Makes good eye contact with examiner.   Fair awareness of deficits. Motor: Right upper extremity: Shoulder abduction 3/5, elbow flexion/extension 4 -/5, wrist extension 2/5, hand grip 3/5 Right lower extremity: 4 -/5 proximal distal Left upper extremity/left lower extremity: Grossly 5/5 proximal distal  Skin: Skin is warm and dry.  Psychiatric: She has a normal mood and affect. Her behavior  is normal.    Results for orders placed or performed during the hospital encounter of 02/24/18 (from the past 24 hour(s))  CBG monitoring, ED      Status: Abnormal   Collection Time: 02/24/18  1:25 PM  Result Value Ref Range   Glucose-Capillary 158 (H) 70 - 99 mg/dL  Basic metabolic panel     Status: Abnormal   Collection Time: 02/24/18  1:30 PM  Result Value Ref Range   Sodium 144 135 - 145 mmol/L   Potassium 4.0 3.5 - 5.1 mmol/L   Chloride 107 98 - 111 mmol/L   CO2 27 22 - 32 mmol/L   Glucose, Bld 167 (H) 70 - 99 mg/dL   BUN 31 (H) 8 - 23 mg/dL   Creatinine, Ser 1.68 (H) 0.44 - 1.00 mg/dL   Calcium 10.1 8.9 - 10.3 mg/dL   GFR calc non Af Amer 29 (L) >60 mL/min   GFR calc Af Amer 34 (L) >60 mL/min   Anion gap 10 5 - 15  CBC     Status: None   Collection Time: 02/24/18  1:30 PM  Result Value Ref Range   WBC 8.4 4.0 - 10.5 K/uL   RBC 4.59 3.87 - 5.11 MIL/uL   Hemoglobin 12.6 12.0 - 15.0 g/dL   HCT 38.4 36.0 - 46.0 %   MCV 83.7 78.0 - 100.0 fL   MCH 27.5 26.0 - 34.0 pg   MCHC 32.8 30.0 - 36.0 g/dL   RDW 13.5 11.5 - 15.5 %   Platelets 232 150 - 400 K/uL  TSH     Status: None   Collection Time: 02/24/18  1:30 PM  Result Value Ref Range   TSH 4.082 0.350 - 4.500 uIU/mL  Magnesium     Status: None   Collection Time: 02/24/18  1:30 PM  Result Value Ref Range   Magnesium 2.2 1.7 - 2.4 mg/dL  Phosphorus     Status: None   Collection Time: 02/24/18  1:30 PM  Result Value Ref Range   Phosphorus 3.6 2.5 - 4.6 mg/dL  Protime-INR     Status: None   Collection Time: 02/24/18  1:30 PM  Result Value Ref Range   Prothrombin Time 12.7 11.4 - 15.2 seconds   INR 0.96   Differential     Status: None   Collection Time: 02/24/18  1:30 PM  Result Value Ref Range   Neutrophils Relative % 68 %   Neutro Abs 5.7 1.7 - 7.7 K/uL   Lymphocytes Relative 22 %   Lymphs Abs 1.8 0.7 - 4.0 K/uL   Monocytes Relative 6 %   Monocytes Absolute 0.5 0.1 - 1.0 K/uL   Eosinophils Relative 3 %   Eosinophils Absolute 0.3 0.0 - 0.7 K/uL   Basophils Relative 1 %   Basophils Absolute 0.1 0.0 - 0.1 K/uL  Ethanol     Status: None   Collection Time:  02/24/18  2:13 PM  Result Value Ref Range   Alcohol, Ethyl (B) <10 <10 mg/dL  I-stat troponin, ED     Status: None   Collection Time: 02/24/18  2:23 PM  Result Value Ref Range   Troponin i, poc 0.02 0.00 - 0.08 ng/mL   Comment 3          Urinalysis, Routine w reflex microscopic     Status: Abnormal   Collection Time: 02/24/18  2:36 PM  Result Value Ref Range   Color, Urine STRAW (A) YELLOW   APPearance CLEAR CLEAR  Specific Gravity, Urine 1.006 1.005 - 1.030   pH 6.0 5.0 - 8.0   Glucose, UA 50 (A) NEGATIVE mg/dL   Hgb urine dipstick SMALL (A) NEGATIVE   Bilirubin Urine NEGATIVE NEGATIVE   Ketones, ur NEGATIVE NEGATIVE mg/dL   Protein, ur 100 (A) NEGATIVE mg/dL   Nitrite NEGATIVE NEGATIVE   Leukocytes, UA NEGATIVE NEGATIVE   RBC / HPF 0-5 0 - 5 RBC/hpf   WBC, UA 0-5 0 - 5 WBC/hpf   Bacteria, UA RARE (A) NONE SEEN  Urine rapid drug screen (hosp performed)     Status: Abnormal   Collection Time: 02/24/18  2:36 PM  Result Value Ref Range   Opiates (A) NONE DETECTED    Result not available. Reagent lot number recalled by manufacturer.   Cocaine NONE DETECTED NONE DETECTED   Benzodiazepines NONE DETECTED NONE DETECTED   Amphetamines NONE DETECTED NONE DETECTED   Tetrahydrocannabinol NONE DETECTED NONE DETECTED   Barbiturates NONE DETECTED NONE DETECTED  APTT     Status: Abnormal   Collection Time: 02/24/18  3:45 PM  Result Value Ref Range   aPTT 23 (L) 24 - 36 seconds  CBG monitoring, ED     Status: Abnormal   Collection Time: 02/24/18  9:25 PM  Result Value Ref Range   Glucose-Capillary 215 (H) 70 - 99 mg/dL   Comment 1 Notify RN   Glucose, capillary     Status: Abnormal   Collection Time: 02/25/18  7:42 AM  Result Value Ref Range   Glucose-Capillary 142 (H) 70 - 99 mg/dL   Mr Virgel Paling Wo Contrast  Result Date: 02/24/2018 CLINICAL DATA:  72 y/o F; 2 days of left arm numbness and history of hypertension. EXAM: MRI HEAD WITHOUT CONTRAST MRA HEAD WITHOUT CONTRAST  TECHNIQUE: Multiplanar, multiecho pulse sequences of the brain and surrounding structures were obtained without intravenous contrast. Angiographic images of the head were obtained using MRA technique without contrast. COMPARISON:  None. FINDINGS: MRI HEAD FINDINGS Brain: Clustered small foci of reduced diffusion compatible with acute/early subacute infarction in the right superior frontoparietal junction spanning 5.5 x 2.7 x 3.7 cm (AP x ML x CC) additional punctate focus within the right insula. No associated hemorrhage or mass effect. Small chronic infarctions are present within the left cerebellar hemisphere, bilateral lentiform nuclei, and the right anterior corona radiata. Several nonspecific T2 FLAIR hyperintensities in subcortical and periventricular white matter are compatible with mild to moderate chronic microvascular ischemic changes for age. Mild to moderate volume loss of the brain. No abnormal susceptibility hypointensity to indicate intracranial hemorrhage. No extra-axial collection, hydrocephalus, focal mass effect, or herniation. Vascular: As below. Skull and upper cervical spine: Normal marrow signal. Sinuses/Orbits: Negative. Other: Bilateral intra-ocular lens replacement. MRA HEAD FINDINGS Internal carotid arteries:  Patent. Anterior cerebral arteries: Patent. Right A3 tandem segment of moderate to severe stenosis. Middle cerebral arteries: Patent. Proximal right M2 superior division short segment of severe stenosis/near occlusion (series 6, image 110). Anterior communicating artery: Patent. Posterior communicating arteries: Not identified, likely hypoplastic or absent. Posterior cerebral arteries: Patent. Mild right P2 and mild left P1 short segments of stenosis. Basilar artery:  Patent. Vertebral arteries:  Patent. No additional evidence of high-grade stenosis, large vessel occlusion, or aneurysm. IMPRESSION: MRI head: 1. Clustered small foci of acute/early subacute infarction within the right  superior frontoparietal junction spanning up to 5.5 cm. Additional punctate infarct in the right insula. No associated hemorrhage or mass effect. 2. Background of multiple small chronic infarctions and cerebellum  and basal ganglia. Mild-to-moderate chronic microvascular ischemic changes and volume loss of the brain for age. MRA head: 1. Proximal right M2 superior division short segment of severe stenosis/near occlusion. 2. Tandem segments of moderate to severe stenosis in the right A3. 3. Mild right P2 and left P1 short segments of stenosis. 4. No additional large vessel occlusion, high-grade stenosis, or aneurysm identified. These results were called by telephone at the time of interpretation on 02/24/2018 at 5:29 pm to Dr. Lita Mains, who verbally acknowledged these results. Electronically Signed   By: Kristine Garbe M.D.   On: 02/24/2018 17:32   [name] Contrast  Result Date: 02/24/2018 CLINICAL DATA:  72 y/o F; 2 days of left arm numbness and history of hypertension. EXAM: MRI HEAD WITHOUT CONTRAST MRA HEAD WITHOUT CONTRAST TECHNIQUE: Multiplanar, multiecho pulse sequences of the brain and surrounding structures were obtained without intravenous contrast. Angiographic images of the head were obtained using MRA technique without contrast. COMPARISON:  None. FINDINGS: MRI HEAD FINDINGS Brain: Clustered small foci of reduced diffusion compatible with acute/early subacute infarction in the right superior frontoparietal junction spanning 5.5 x 2.7 x 3.7 cm (AP x ML x CC) additional punctate focus within the right insula. No associated hemorrhage or mass effect. Small chronic infarctions are present within the left cerebellar hemisphere, bilateral lentiform nuclei, and the right anterior corona radiata. Several nonspecific T2 FLAIR hyperintensities in subcortical and periventricular white matter are compatible with mild to moderate chronic microvascular ischemic changes for age. Mild to moderate volume  loss of the brain. No abnormal susceptibility hypointensity to indicate intracranial hemorrhage. No extra-axial collection, hydrocephalus, focal mass effect, or herniation. Vascular: As below. Skull and upper cervical spine: Normal marrow signal. Sinuses/Orbits: Negative. Other: Bilateral intra-ocular lens replacement. MRA HEAD FINDINGS Internal carotid arteries:  Patent. Anterior cerebral arteries: Patent. Right A3 tandem segment of moderate to severe stenosis. Middle cerebral arteries: Patent. Proximal right M2 superior division short segment of severe stenosis/near occlusion (series 6, image 110). Anterior communicating artery: Patent. Posterior communicating arteries: Not identified, likely hypoplastic or absent. Posterior cerebral arteries: Patent. Mild right P2 and mild left P1 short segments of stenosis. Basilar artery:  Patent. Vertebral arteries:  Patent. No additional evidence of high-grade stenosis, large vessel occlusion, or aneurysm. IMPRESSION: MRI head: 1. Clustered small foci of acute/early subacute infarction within the right superior frontoparietal junction spanning up to 5.5 cm. Additional punctate infarct in the right insula. No associated hemorrhage or mass effect. 2. Background of multiple small chronic infarctions and cerebellum and basal ganglia. Mild-to-moderate chronic microvascular ischemic changes and volume loss of the brain for age. MRA head: 1. Proximal right M2 superior division short segment of severe stenosis/near occlusion. 2. Tandem segments of moderate to severe stenosis in the right A3. 3. Mild right P2 and left P1 short segments of stenosis. 4. No additional large vessel occlusion, high-grade stenosis, or aneurysm identified. These results were called by telephone at the time of interpretation on 02/24/2018 at 5:29 pm to Dr. Lita Mains, who verbally acknowledged these results. Electronically Signed   By: Kristine Garbe M.D.   On: 02/24/2018 17:32     Assessment/Plan: Diagnosis: multiple right CVA Labs and images (see above) independently reviewed.  Records reviewed and summated above. Stroke: Continue secondary stroke prophylaxis and Risk Factor Modification listed below:   Antiplatelet therapy:   Blood Pressure Management:  Continue current medication with prn's with permisive HTN per primary team Statin Agent:   Diabetes management:   Left sided hemiparesis: fit  for orthosis to prevent contractures (resting hand splint for day, wrist cock up splint at night, etc) Motor recovery: Fluoxetine  1. Does the need for close, 24 hr/day medical supervision in concert with the patient's rehab needs make it unreasonable for this patient to be served in a less intensive setting? Yes 2. Co-Morbidities requiring supervision/potential complications: DM (Monitor in accordance with exercise and adjust meds as necessary), HTN (monitor and provide prns in accordance with increased physical exertion and pain), CKD stage III (avoid nephrotoxic meds) 3. Due to safety, disease management and patient education, does the patient require 24 hr/day rehab nursing? Yes 4. Does the patient require coordinated care of a physician, rehab nurse, PT (1-2 hrs/day, 5 days/week) and OT (1-2 hrs/day, 5 days/week) to address physical and functional deficits in the context of the above medical diagnosis(es)? Yes Addressing deficits in the following areas: balance, endurance, locomotion, strength, transferring, bathing, dressing, toileting and psychosocial support 5. Can the patient actively participate in an intensive therapy program of at least 3 hrs of therapy per day at least 5 days per week? Yes 6. The potential for patient to make measurable gains while on inpatient rehab is excellent 7. Anticipated functional outcomes upon discharge from inpatient rehab are supervision  with PT, supervision with OT, n/a with SLP. 8. Estimated rehab length of stay to reach the above  functional goals is: 10-14 days. 9. Anticipated D/C setting: Home 10. Anticipated post D/C treatments: HH therapy and Home excercise program 11. Overall Rehab/Functional Prognosis: good  RECOMMENDATIONS: This patient's condition is appropriate for continued rehabilitative care in the following setting: Recommend CIR, however patient refusing at present. She states that she would like to speak with her daughter., but hopes to go home with home health. recommend PM&R outpatient follow-up. Patient has agreed to participate in recommended program. Yes Note that insurance prior authorization may be required for reimbursement for recommended care.  Comment: Rehab Admissions Coordinator to follow up.   I have personally performed a face to face diagnostic evaluation, including, but not limited to relevant history and physical exam findings, of this patient and developed relevant assessment and plan.  Additionally, I have reviewed and concur with the physician assistant's documentation above.   Delice Lesch, MD, ABPMR Lavon Paganini Angiulli, PA-C 02/25/2018

## 2018-02-25 NOTE — Progress Notes (Signed)
PROGRESS NOTE    OTTIS SARNOWSKI  NUU:[id number] DOB: 1946-04-19 DOA: 02/24/2018 PCP: Lucille Passy, MD   Brief Narrative:  72 year old with history of diabetes mellitus type 2, essential hypertension, CKD stage II-3 was sent to the hospital from primary care physician's office for left upper extremity weakness and elevated blood pressure.  Patient's systolic blood pressure was greater than 200 and the primary care physician's office.  Patient underwent evaluation in the ER here and MRI showed acute/early subacute infarction in the right superior frontoparietal junction.  Neurology was consulted and she was admitted for routine stroke work-up.  Carotid Dopplers were noted to be relatively unremarkable.   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  There is an to severe stenosis seen on the right, right 18 moderate to severe stenosis.  Carotid Dopplers shows 1-39% stenosis bilaterally -Echocardiogram-pending - TEE has been ordered by neurology for tomorrow; before this she wants to speak with her son-in-law who is a cardiologist. -Lipid panel and hemoglobin A1c pending. -Currently undergoing CIR evaluation. -Plan for aspirin and Plavix for 3 weeks followed by aspirin alone.  Hypertensive emergency Essential Hypertension - Currently blood pressure systolic is 347 range.  Continue to allow this and slowly normalized over next a week.  Currently is on lisinopril 2.5 mg daily.  Diabetes mellitus type 2 -Holding home regimen of metformin.  Continue Accu-Cheks and sliding  scale  CKD stage III -Currently this is stable.  Monitor renal function closely.  Monitor urine output.  Macular edema -Secondary to diabetes.  Follow-up outpatient.  DVT prophylaxis: SCDs Code Status: Full Code  Family Communication: Daughter at Bedside   Disposition Plan: Maintain inpatient stay  Consultants:   Neurology  Procedures:   None  Antimicrobials:   None   Subjective: Her stroke symptoms appears to be slightly improving but not yet back at baseline.  Tolerating oral diet.  Review of Systems Otherwise negative except as per HPI, including: General: Denies fever, chills, night sweats or unintended weight loss. Resp: Denies cough, wheezing, shortness of breath. Cardiac: Denies chest pain, palpitations, orthopnea, paroxysmal nocturnal dyspnea. GI: Denies abdominal pain, nausea, vomiting, diarrhea or constipation GU: Denies dysuria, frequency, hesitancy or incontinence MS: Denies muscle aches, joint pain or swelling Neuro: Denies headache, neurologic deficits (focal weakness, numbness, tingling), abnormal gait Psych: Denies anxiety, depression, SI/HI/AVH Skin: Denies new rashes or lesions ID: Denies sick contacts, exotic exposures, travel  Objective: Vitals:   02/25/18 0602 02/25/18 0916 02/25/18 1045 02/25/18 1200  BP: (!) 172/53 (!) 176/57 (!) 183/61 (!) 176/57  Pulse: 90 83  85  Resp:  18  18  Temp:  99.2 F (37.3 C)  99.3 F (37.4 C)  TempSrc:  Oral  Oral  SpO2: 98% 97%  95%  Weight:      Height:        Intake/Output Summary (Last 24 hours) at 02/25/2018 1349 Last data filed at 02/25/2018 0410 Gross per 24 hour  Intake 33.4 ml  Output -  Net 33.4 ml   Filed Weights   02/24/18 1252  Weight: 71.3 kg  Examination:  General exam: Appears calm and comfortable  Respiratory system: Clear to auscultation. Respiratory effort normal. Cardiovascular system: S1 & S2 heard, RRR. No JVD, murmurs, rubs, gallops or clicks. No pedal  edema. Gastrointestinal system: Abdomen is nondistended, soft and nontender. No organomegaly or masses felt. Normal bowel sounds heard. Central nervous system: Alert and oriented. No focal neurological deficits. Extremities: Left upper extremity strength 3/5, right upper and lower extremity strength 4/5 Skin: No rashes, lesions or ulcers Psychiatry: Judgement and insight appear normal. Mood & affect appropriate.   Data Reviewed:   CBC: Recent Labs  Lab 02/24/18 1330  WBC 8.4  NEUTROABS 5.7  HGB 12.6  HCT 38.4  MCV 83.7  PLT 595   Basic Metabolic Panel: Recent Labs  Lab 02/24/18 1330  NA 144  K 4.0  CL 107  CO2 27  GLUCOSE 167*  BUN 31*  CREATININE 1.68*  CALCIUM 10.1  MG 2.2  PHOS 3.6   GFR: Estimated Creatinine Clearance: 28.8 mL/min (A) (by C-G formula based on SCr of 1.68 mg/dL (H)). Liver Function Tests: No results for input(s): AST, ALT, ALKPHOS, BILITOT, PROT, ALBUMIN in the last 168 hours. No results for input(s): LIPASE, AMYLASE in the last 168 hours. No results for input(s): AMMONIA in the last 168 hours. Coagulation Profile: Recent Labs  Lab 02/24/18 1330  INR 0.96   Cardiac Enzymes: No results for input(s): CKTOTAL, CKMB, CKMBINDEX, TROPONINI in the last 168 hours. BNP (last 3 results) No results for input(s): PROBNP in the last 8760 hours. HbA1C: No results for input(s): HGBA1C in the last 72 hours. CBG: Recent Labs  Lab 02/24/18 1325 02/24/18 2125 02/25/18 0742 02/25/18 1210  GLUCAP 158* 215* 142* 205*   Lipid Profile: No results for input(s): CHOL, HDL, LDLCALC, TRIG, CHOLHDL, LDLDIRECT in the last 72 hours. Thyroid Function Tests: Recent Labs    02/24/18 1330  TSH 4.082   Anemia Panel: No results for input(s): VITAMINB12, FOLATE, FERRITIN, TIBC, IRON, RETICCTPCT in the last 72 hours. Sepsis Labs: No results for input(s): PROCALCITON, LATICACIDVEN in the last 168 hours.  No results found for this or any previous visit (from the  past 240 hour(s)).       Radiology Studies: Mr Virgel Paling GL Contrast  Result Date: 02/24/2018 CLINICAL DATA:  72 y/o F; 2 days of left arm numbness and history of hypertension. EXAM: MRI HEAD WITHOUT CONTRAST MRA HEAD WITHOUT CONTRAST TECHNIQUE: Multiplanar, multiecho pulse sequences of the brain and surrounding structures were obtained without intravenous contrast. Angiographic images of the head were obtained using MRA technique without contrast. COMPARISON:  None. FINDINGS: MRI HEAD FINDINGS Brain: Clustered small foci of reduced diffusion compatible with acute/early subacute infarction in the right superior frontoparietal junction spanning 5.5 x 2.7 x 3.7 cm (AP x ML x CC) additional punctate focus within the right insula. No associated hemorrhage or mass effect. Small chronic infarctions are present within the left cerebellar hemisphere, bilateral lentiform nuclei, and the right anterior corona radiata. Several nonspecific T2 FLAIR hyperintensities in subcortical and periventricular white matter are compatible with mild to moderate chronic microvascular ischemic changes for age. Mild to moderate volume loss of the brain. No abnormal susceptibility hypointensity to indicate intracranial hemorrhage. No extra-axial collection, hydrocephalus, focal mass effect, or herniation. Vascular: As below. Skull and upper cervical spine: Normal marrow signal. Sinuses/Orbits: Negative. Other: Bilateral intra-ocular lens replacement. MRA HEAD FINDINGS Internal carotid arteries:  Patent. Anterior cerebral arteries: Patent. Right A3 tandem segment of moderate to severe stenosis. Middle  cerebral arteries: Patent. Proximal right M2 superior division short segment of severe stenosis/near occlusion (series 6, image 110). Anterior communicating artery: Patent. Posterior communicating arteries: Not identified, likely hypoplastic or absent. Posterior cerebral arteries: Patent. Mild right P2 and mild left P1 short segments of  stenosis. Basilar artery:  Patent. Vertebral arteries:  Patent. No additional evidence of high-grade stenosis, large vessel occlusion, or aneurysm. IMPRESSION: MRI head: 1. Clustered small foci of acute/early subacute infarction within the right superior frontoparietal junction spanning up to 5.5 cm. Additional punctate infarct in the right insula. No associated hemorrhage or mass effect. 2. Background of multiple small chronic infarctions and cerebellum and basal ganglia. Mild-to-moderate chronic microvascular ischemic changes and volume loss of the brain for age. MRA head: 1. Proximal right M2 superior division short segment of severe stenosis/near occlusion. 2. Tandem segments of moderate to severe stenosis in the right A3. 3. Mild right P2 and left P1 short segments of stenosis. 4. No additional large vessel occlusion, high-grade stenosis, or aneurysm identified. These results were called by telephone at the time of interpretation on 02/24/2018 at 5:29 pm to Dr. Lita Mains, who verbally acknowledged these results. Electronically Signed   By: Kristine Garbe M.D.   On: 02/24/2018 17:32   [name] Contrast  Result Date: 02/24/2018 CLINICAL DATA:  72 y/o F; 2 days of left arm numbness and history of hypertension. EXAM: MRI HEAD WITHOUT CONTRAST MRA HEAD WITHOUT CONTRAST TECHNIQUE: Multiplanar, multiecho pulse sequences of the brain and surrounding structures were obtained without intravenous contrast. Angiographic images of the head were obtained using MRA technique without contrast. COMPARISON:  None. FINDINGS: MRI HEAD FINDINGS Brain: Clustered small foci of reduced diffusion compatible with acute/early subacute infarction in the right superior frontoparietal junction spanning 5.5 x 2.7 x 3.7 cm (AP x ML x CC) additional punctate focus within the right insula. No associated hemorrhage or mass effect. Small chronic infarctions are present within the left cerebellar hemisphere, bilateral lentiform  nuclei, and the right anterior corona radiata. Several nonspecific T2 FLAIR hyperintensities in subcortical and periventricular white matter are compatible with mild to moderate chronic microvascular ischemic changes for age. Mild to moderate volume loss of the brain. No abnormal susceptibility hypointensity to indicate intracranial hemorrhage. No extra-axial collection, hydrocephalus, focal mass effect, or herniation. Vascular: As below. Skull and upper cervical spine: Normal marrow signal. Sinuses/Orbits: Negative. Other: Bilateral intra-ocular lens replacement. MRA HEAD FINDINGS Internal carotid arteries:  Patent. Anterior cerebral arteries: Patent. Right A3 tandem segment of moderate to severe stenosis. Middle cerebral arteries: Patent. Proximal right M2 superior division short segment of severe stenosis/near occlusion (series 6, image 110). Anterior communicating artery: Patent. Posterior communicating arteries: Not identified, likely hypoplastic or absent. Posterior cerebral arteries: Patent. Mild right P2 and mild left P1 short segments of stenosis. Basilar artery:  Patent. Vertebral arteries:  Patent. No additional evidence of high-grade stenosis, large vessel occlusion, or aneurysm. IMPRESSION: MRI head: 1. Clustered small foci of acute/early subacute infarction within the right superior frontoparietal junction spanning up to 5.5 cm. Additional punctate infarct in the right insula. No associated hemorrhage or mass effect. 2. Background of multiple small chronic infarctions and cerebellum and basal ganglia. Mild-to-moderate chronic microvascular ischemic changes and volume loss of the brain for age. MRA head: 1. Proximal right M2 superior division short segment of severe stenosis/near occlusion. 2. Tandem segments of moderate to severe stenosis in the right A3. 3. Mild right P2 and left P1 short segments of stenosis. 4. No additional large vessel  occlusion, high-grade stenosis, or aneurysm identified. These  results were called by telephone at the time of interpretation on 02/24/2018 at 5:29 pm to Dr. Lita Mains, who verbally acknowledged these results. Electronically Signed   By: Kristine Garbe M.D.   On: 02/24/2018 17:32        Scheduled Meds: . aspirin EC  81 mg Oral Daily  . clopidogrel  75 mg Oral Daily  . fluticasone  2 spray Each Nare Daily  . insulin aspart  0-5 Units Subcutaneous QHS  . insulin aspart  0-9 Units Subcutaneous TID WC  . lisinopril  2.5 mg Oral Daily  . loratadine  10 mg Oral Daily   Continuous Infusions: . sodium chloride 10 mL/hr at 02/25/18 0028     LOS: 0 days    I have spent 35 minutes face to face with the patient and on the ward discussing the patients care, assessment, plan and disposition with other care givers. >50% of the time was devoted counseling the patient about the risks and benefits of treatment and coordinating care.     Kristyn Obyrne Arsenio Loader, MD [facility] Pager [phone]   If 7PM-7AM, please contact night-coverage [email or web address] Password [email or web address] 02/25/2018, 1:49 PM

## 2018-02-25 NOTE — Progress Notes (Signed)
Patient BP 217/93.  Notified NP on call for [facility].  Will continue to monitor the patient

## 2018-02-25 NOTE — Progress Notes (Signed)
*  PRELIMINARY RESULTS* Vascular Ultrasound Carotid Duplex (Doppler) has been completed. Findings suggest 1-39% internal carotid artery stenosis bilaterally. Vertebral arteries are patent with antegrade flow.  02/25/2018 8:57 AM Maudry Mayhew, MHA, RVT, RDCS, RDMS

## 2018-02-25 NOTE — Progress Notes (Signed)
Rehab Admissions Coordinator Note:  Per OT and PT recommendation, Patient was screened by Jhonnie Garner for appropriateness for an Inpatient Acute Rehab Consult.  At this time, we are recommending Inpatient Rehab consult. AC will contact MD regarding IP rehab consult order.   Jhonnie Garner 02/25/2018, 10:03 AM  I can be reached at [phone].

## 2018-02-25 NOTE — Evaluation (Signed)
Physical Therapy Evaluation Patient Details Name: [name] MRN: [id number] DOB: [birth year] Today's Date: 02/25/2018   History of Present Illness  72 y.o. female who presented to the ED from her PCP's office with LUE weakness and elevated BP. 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   Clinical Impression  Orders received for PT evaluation. Patient demonstrates deficits in functional mobility as indicated below. Will benefit from continued skilled PT to address deficits and maximize function.   Prior to admission, patient was independent with activity until neuro deficits began to impact function. At this time, patient is reliance on physical assist for all aspects of mobility and additionally patient showing significant impairments in LUE function and poor awareness of left side leaving patient at high risk for injury and further functional deficits. Given patient's prior level of function, willingness to participate and great family support, feel patient would be an ideal candidate for comprehensive therapies to maximize functional recovery and regain independence. Patient and daughter very much hoping for return to independence. Will continue to see and progress as tolerated.    Follow Up Recommendations CIR    Equipment Recommendations  None recommended by PT    Recommendations for Other Services Rehab consult     Precautions / Restrictions Precautions Precautions: Fall      Mobility  Bed Mobility Overal bed mobility: Needs Assistance Bed Mobility: Supine to Sit     Supine to sit: Min assist     General bed mobility comments: signficant time to perform, HOB elevated and max multi modal cues to turn to side with physical assist for elevation of trunk to upright. (posterior lean upon coming to upright)  Transfers Overall transfer  level: Needs assistance Equipment used: 2 person hand held assist Transfers: Sit to/from Stand Sit to Stand: Min assist         General transfer comment: Min assist to power to upright, with blocking of LLE and Vcs for positioning.   Ambulation/Gait Ambulation/Gait assistance: Min assist;+2 physical assistance Gait Distance (Feet): 18 Feet Assistive device: 2 person hand held assist Gait Pattern/deviations: Step-to pattern;Decreased stride length;Decreased weight shift to left Gait velocity: decreased   General Gait Details: patient with difficulty shifting weight for LE advancement, noted LLE lag and heavy list/reliance to the right. Assist provided. Poor awareness of left UE.  Stairs            Wheelchair Mobility    Modified Rankin (Stroke Patients Only) Modified Rankin (Stroke Patients Only) Pre-Morbid Rankin Score: No symptoms Modified Rankin: Moderately severe disability     Balance Overall balance assessment: Needs assistance   Sitting balance-Leahy Scale: Poor Sitting balance - Comments: difficulty maintaining midline, posterior lean with any dynamic movement, increased time to correct Postural control: Posterior lean Standing balance support: Single extremity supported Standing balance-Leahy Scale: Poor Standing balance comment: relaince on UE support in standing, decreased attention to left side                             Pertinent Vitals/Pain Pain Assessment: No/denies pain    Home Living Family/patient expects to be discharged to:: Private residence Living Arrangements: Spouse/significant other;Children Available Help at Discharge: Family Type of Home: House Home Access: Stairs to enter   Technical brewer of Steps: 2 Home Layout: One level Home Equipment: Environmental consultant - 2 wheels;Cane - single  point      Prior Function Level of Independence: Independent               Hand Dominance   Dominant Hand: Right     Extremity/Trunk Assessment   Upper Extremity Assessment Upper Extremity Assessment: LUE deficits/detail LUE Deficits / Details: Noted LUE weakness gross motions 3+/proximal; < 3/5 distally with no active digit extension at this time LUE Sensation: decreased light touch;decreased proprioception LUE Coordination: decreased fine motor;decreased gross motor    Lower Extremity Assessment Lower Extremity Assessment: Defer to PT evaluation LLE Deficits / Details: good strength upon assessment, but poor functional awareness during task performance/ambulation LLE Sensation: decreased proprioception LLE Coordination: decreased fine motor;decreased gross motor       Communication   Communication: No difficulties  Cognition Arousal/Alertness: Awake/alert Behavior During Therapy: WFL for tasks assessed/performed Overall Cognitive Status: Impaired/Different from baseline Area of Impairment: Attention;Problem solving                   Current Attention Level: Alternating         Problem Solving: Requires verbal cues;Requires tactile cues General Comments: decreased awareness/ attention to left side but can attend with cues.      General Comments      Exercises     Assessment/Plan    PT Assessment Patient needs continued PT services  PT Problem List Decreased strength;Decreased activity tolerance;Decreased balance;Decreased mobility;Decreased coordination;Decreased safety awareness;Impaired sensation       PT Treatment Interventions DME instruction;Gait training;Functional mobility training;Therapeutic activities;Therapeutic exercise;Balance training;Neuromuscular re-education;Patient/family education    PT Goals (Current goals can be found in the Care Plan section)  Acute Rehab PT Goals Patient Stated Goal: to get back to complete independence PT Goal Formulation: With patient/family Time For Goal Achievement: 03/11/18 Potential to Achieve Goals: Good    Frequency  Min 4X/week   Barriers to discharge        Co-evaluation               AM-PAC PT "6 Clicks" Daily Activity  Outcome Measure Difficulty turning over in bed (including adjusting bedclothes, sheets and blankets)?: A Little Difficulty moving from lying on back to sitting on the side of the bed? : Unable Difficulty sitting down on and standing up from a chair with arms (e.g., wheelchair, bedside commode, etc,.)?: Unable Help needed moving to and from a bed to chair (including a wheelchair)?: A Little Help needed walking in hospital room?: A Little Help needed climbing 3-5 steps with a railing? : A Lot 6 Click Score: 13    End of Session Equipment Utilized During Treatment: Gait belt Activity Tolerance: Patient tolerated treatment well Patient left: in chair;with call bell/phone within reach;with family/visitor present Nurse Communication: Mobility status PT Visit Diagnosis: Difficulty in walking, not elsewhere classified (R26.2);Other symptoms and signs involving the nervous system (R29.898)    Time: 8366-2947 PT Time Calculation (min) (ACUTE ONLY): 23 min   Charges:   PT Evaluation $PT Eval Low Complexity: [place], PT DPT  Board Certified Neurologic Specialist [place] 02/25/2018, 9:50 AM

## 2018-02-25 NOTE — Progress Notes (Signed)
  Echocardiogram 2D Echocardiogram has been performed.  [name] 02/25/2018, 9:01 AM

## 2018-02-25 NOTE — Progress Notes (Signed)
Patient arrived to the unit via [facility] from [facility].  Patient is alert and oriented x 4. No complaint of pain.  Skin assessment complete.  Skin tear to the left forearm.  Placed the patient on cardiac monitoring. Educated the patient on how to reach the staff on the unit.  Will continue to monitor the patient and notify MD as needed

## 2018-02-25 NOTE — Progress Notes (Signed)
Inpatient Rehabilitation Admissions Coordinator  I will follow up with patient and family tomorrow.  Danne Baxter, RN, MSN Rehab Admissions Coordinator [phone] 02/25/2018 9:07 PM

## 2018-02-25 NOTE — Progress Notes (Addendum)
STROKE TEAM PROGRESS NOTE  HPI: 6 dr Cheral Marker ) [name] is an 72 y.o. female who presented to the ED from her PCP's office with LUE weakness and elevated BP. Her symptoms began 3 days previously with sensation of her LUE being heavy, weak and clumsy. Her BP at home also had been elevated. At her PCP's office her SBP was > 200.  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 Also noted on MRI were several chronic infarctions. Labetalol was administered for BP control. She is not on an antiplatelet agent or anticoagulation at home.  LSN: Approximately 3 days ago tPA Given: No: Out of time window.   INTERVAL HISTORY Her daughter is at the bedside.  She went to the clinic for eval 3 days ago who did not address. Arm continued to weaken, and she went to her PCP who sent her to the hospital. BP was elevated at PCP. Her son-in-law is a cardiologist in [place]. Her dtr will speak with him and confirm agreement to loop.  Vitals:   02/25/18 0202 02/25/18 0402 02/25/18 0602 02/25/18 0916  BP: (!) 173/54 (!) 173/77 (!) 172/53 (!) 176/57  Pulse: 82 85 90 83  Resp:    18  Temp:    99.2 F (37.3 C)  TempSrc:    Oral  SpO2: 97% 96% 98% 97%  Weight:      Height:        CBC:  Recent Labs  Lab 02/24/18 1330  WBC 8.4  NEUTROABS 5.7  HGB 12.6  HCT 38.4  MCV 83.7  PLT 409    Basic Metabolic Panel:  Recent Labs  Lab 02/24/18 1330  NA 144  K 4.0  CL 107  CO2 27  GLUCOSE 167*  BUN 31*  CREATININE 1.68*  CALCIUM 10.1  MG 2.2  PHOS 3.6   Lipid Panel: No results found for: CHOL, TRIG, HDL, CHOLHDL, VLDL, LDLCALC HgbA1c:  Lab Results  Component Value Date   HGBA1C 7.3 (H) 09/02/2017   Urine Drug Screen:     Component Value Date/Time   LABOPIA (A) 02/24/2018 1436    Result not available. Reagent lot number recalled by manufacturer.    COCAINSCRNUR NONE DETECTED 02/24/2018 1436   LABBENZ NONE DETECTED 02/24/2018 1436   AMPHETMU NONE DETECTED 02/24/2018 1436   THCU NONE DETECTED 02/24/2018 1436   LABBARB NONE DETECTED 02/24/2018 1436    Alcohol Level     Component Value Date/Time   ETH <10 02/24/2018 1413    IMAGING [name] Head Wo Contrast  Result Date: 02/24/2018 CLINICAL DATA:  72 y/o F; 2 days of left arm numbness and history of hypertension. EXAM: MRI HEAD WITHOUT CONTRAST MRA HEAD WITHOUT CONTRAST TECHNIQUE: Multiplanar, multiecho pulse sequences of the brain and surrounding structures were obtained without intravenous contrast. Angiographic images of the head were obtained using MRA technique without contrast. COMPARISON:  None. FINDINGS: MRI HEAD FINDINGS Brain: Clustered small foci of reduced diffusion compatible with acute/early subacute infarction in the right superior frontoparietal junction spanning 5.5 x 2.7 x 3.7 cm (AP x ML x CC) additional punctate focus within the right insula. No associated hemorrhage or mass effect. Small chronic infarctions are present within the left cerebellar hemisphere, bilateral lentiform nuclei, and the right anterior corona radiata. Several nonspecific T2 FLAIR hyperintensities in subcortical and periventricular white matter are compatible with mild to moderate chronic  microvascular ischemic changes for age. Mild to moderate volume loss of the brain. No abnormal susceptibility hypointensity to indicate intracranial hemorrhage. No extra-axial collection, hydrocephalus, focal mass effect, or herniation. Vascular: As below. Skull and upper cervical spine: Normal marrow signal. Sinuses/Orbits: Negative. Other: Bilateral intra-ocular lens replacement. MRA HEAD FINDINGS Internal carotid arteries:  Patent. Anterior cerebral arteries: Patent. Right A3 tandem segment of moderate to severe stenosis. Middle cerebral arteries: Patent. Proximal right M2 superior division short segment of severe  stenosis/near occlusion (series 6, image 110). Anterior communicating artery: Patent. Posterior communicating arteries: Not identified, likely hypoplastic or absent. Posterior cerebral arteries: Patent. Mild right P2 and mild left P1 short segments of stenosis. Basilar artery:  Patent. Vertebral arteries:  Patent. No additional evidence of high-grade stenosis, large vessel occlusion, or aneurysm. IMPRESSION: MRI head: 1. Clustered small foci of acute/early subacute infarction within the right superior frontoparietal junction spanning up to 5.5 cm. Additional punctate infarct in the right insula. No associated hemorrhage or mass effect. 2. Background of multiple small chronic infarctions and cerebellum and basal ganglia. Mild-to-moderate chronic microvascular ischemic changes and volume loss of the brain for age. MRA head: 1. Proximal right M2 superior division short segment of severe stenosis/near occlusion. 2. Tandem segments of moderate to severe stenosis in the right A3. 3. Mild right P2 and left P1 short segments of stenosis. 4. No additional large vessel occlusion, high-grade stenosis, or aneurysm identified. These results were called by telephone at the time of interpretation on 02/24/2018 at 5:29 pm to Dr. Lita Mains, who verbally acknowledged these results. Electronically Signed   By: Kristine Garbe M.D.   On: 02/24/2018 17:32   [name] Contrast  Result Date: 02/24/2018 CLINICAL DATA:  72 y/o F; 2 days of left arm numbness and history of hypertension. EXAM: MRI HEAD WITHOUT CONTRAST MRA HEAD WITHOUT CONTRAST TECHNIQUE: Multiplanar, multiecho pulse sequences of the brain and surrounding structures were obtained without intravenous contrast. Angiographic images of the head were obtained using MRA technique without contrast. COMPARISON:  None. FINDINGS: MRI HEAD FINDINGS Brain: Clustered small foci of reduced diffusion compatible with acute/early subacute infarction in the right superior  frontoparietal junction spanning 5.5 x 2.7 x 3.7 cm (AP x ML x CC) additional punctate focus within the right insula. No associated hemorrhage or mass effect. Small chronic infarctions are present within the left cerebellar hemisphere, bilateral lentiform nuclei, and the right anterior corona radiata. Several nonspecific T2 FLAIR hyperintensities in subcortical and periventricular white matter are compatible with mild to moderate chronic microvascular ischemic changes for age. Mild to moderate volume loss of the brain. No abnormal susceptibility hypointensity to indicate intracranial hemorrhage. No extra-axial collection, hydrocephalus, focal mass effect, or herniation. Vascular: As below. Skull and upper cervical spine: Normal marrow signal. Sinuses/Orbits: Negative. Other: Bilateral intra-ocular lens replacement. MRA HEAD FINDINGS Internal carotid arteries:  Patent. Anterior cerebral arteries: Patent. Right A3 tandem segment of moderate to severe stenosis. Middle cerebral arteries: Patent. Proximal right M2 superior division short segment of severe stenosis/near occlusion (series 6, image 110). Anterior communicating artery: Patent. Posterior communicating arteries: Not identified, likely hypoplastic or absent. Posterior cerebral arteries: Patent. Mild right P2 and mild left P1 short segments of stenosis. Basilar artery:  Patent. Vertebral arteries:  Patent. No additional evidence of high-grade stenosis, large vessel occlusion, or aneurysm. IMPRESSION: MRI head: 1. Clustered small foci of acute/early subacute infarction within the right superior frontoparietal junction spanning up to 5.5 cm. Additional punctate infarct in the right insula. No associated hemorrhage  or mass effect. 2. Background of multiple small chronic infarctions and cerebellum and basal ganglia. Mild-to-moderate chronic microvascular ischemic changes and volume loss of the brain for age. MRA head: 1. Proximal right M2 superior division short  segment of severe stenosis/near occlusion. 2. Tandem segments of moderate to severe stenosis in the right A3. 3. Mild right P2 and left P1 short segments of stenosis. 4. No additional large vessel occlusion, high-grade stenosis, or aneurysm identified. These results were called by telephone at the time of interpretation on 02/24/2018 at 5:29 pm to Dr. Lita Mains, who verbally acknowledged these results. Electronically Signed   By: Kristine Garbe M.D.   On: 02/24/2018 17:32   Carotid Doppler   There is 1-39% bilateral ICA stenosis. Vertebral artery flow is antegrade.   2D Echocardiogram  pending  TEE pending    PHYSICAL EXAM Pleasant frail elderly not in distress. . Afebrile. Head is nontraumatic. Neck is supple without bruit.    Cardiac exam no murmur or gallop. Lungs are clear to auscultation. Distal pulses are well felt. Neurological Exam ;  Awake  Alert oriented x 3. Normal speech and language.eye movements full without nystagmus.fundi were not visualized. Vision acuity and fields appear normal. Hearing is normal. Palatal movements are normal. Face symmetric. Tongue midline. Mild left upper extremity limited drift with weakness predominantly of the distal hand muscles and dressed 2/5 with 4/5 strength at the shoulder and elbow. No weakness of the legs.. Normal sensation. Gait deferred.  ASSESSMENT/PLAN [name] is a 72 y.o. female with history of diabetes presenting with LUE weakness and numbness x 3 days.   Stroke:  right superior frontal scattered foci and R insular foci infarcts felt to be embolic secondary to unknown source, suspect AF  MRI  Cluster small foci R superior frontoparietal junction and punctate R insular infarct. 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 Small vessel disease. Atrophy.  MRA  prox R M2 severe stenosis, R A3 mod to severe, mild R P2 and L P1 stenosis.  Carotid Doppler  B ICA 1-39% stenosis, VAs antegrade   2D Echo  pending    TEE to look for embolic source. Arranged with [place] for tomorrow.  If positive for PFO (patent foramen ovale), check bilateral lower extremity venous dopplers to rule out DVT as possible source of stroke. (I have made patient NPO after midnight tonight).  If TEE negative, a [facility] electrophysiologist will consult and consider placement of an implantable loop recorder to evaluate for atrial fibrillation as etiology of stroke. This has been explained to patient/family by Dr. Leonie Man and they are agreeable.  LDL pending   HgbA1c pending   SCDs for VTE prophylaxis  No antithrombotic prior to admission, now on aspirin 325 mg daily. Given  mild stroke, will place on aspirin 81 mg and plavix 75 mg daily x 3 weeks, then aspirin alone. Orders adjusted.   Therapy recommendations:  CIR. Consult placed.  Disposition:  pending   Hypertensive Emergency  BP > 200 on arrival to PCP  BP relates elevated but within Permissive hypertension parameters (OK if < 220/120) but gradually normalize in 5-7 days  Difficult to control PTA. Concern for HF. 2D pending  . Long-term BP goal normotensive  Diabetes type II  HgbA1c pending , goal < 7.0  Other Stroke Risk Factors  Advanced age  Overweight, Body mass index is 27.62 kg/m., recommend weight loss, diet and exercise as appropriate   Other  Active Problems  CKD III  Macular edema d/t diabetes  Hospital day # 0  Burnetta Sabin, MSN, APRN, ANVP-BC, AGPCNP-BC Advanced Practice Stroke Nurse [place] for Schedule & Pager information 02/25/2018 10:02 AM  I have personally examined this patient, reviewed notes, independently viewed imaging studies, participated in medical decision making and plan of care.ROS completed by me personally and pertinent positives fully documented  I have made any additions or clarifications directly to the above note. Agree with note above.   She has presented with embolic right frontal MCA branch infarct and needs further cardiac evaluation with transesophageal echo as well as prolonged cardiac monitoring with a loop recorder. Start dual antiplatelet therapy for 3 weeks followed by aspirin alone. Long discussion with the patient and daughter at the bedside and answered questions about her care. Discussed with Dr. Reesa Chew. Greater than 50% time during this 35 minute visit was spent on counseling and coordination of care about an embolic stroke and answering questions Antony Contras, MD Medical Director [place] Pager: [phone] 02/25/2018 1:31 PM  To contact Stroke Continuity provider, please refer to [email or web address] After hours, contact General Neurology

## 2018-02-25 NOTE — Evaluation (Signed)
Speech Language Pathology Evaluation Patient Details Name: [name] MRN: [id number] DOB: [birth year] Today's Date: 02/25/2018 Time: 1340-1405 SLP Time Calculation (min) (ACUTE ONLY): 25 min  Problem List:  Patient Active Problem List   Diagnosis Date Noted  . Hypertensive urgency 02/24/2018  . Acute CVA (cerebrovascular accident) ([place]) 02/24/2018  . 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 Weakness of left upper extremity 02/24/2018  . Overweight (BMI 25.0-29.9) 02/24/2018  . CKD (chronic kidney disease), stage III ([place]) 02/24/2018  . Diabetes mellitus without complication ([place]) 15/83/0940  . HTN (hypertension) 09/02/2017  . Fall at home, initial encounter 09/02/2017  . Knee pain, bilateral 09/02/2017  . Weakness 09/02/2017   Past Medical History:  Past Medical History:  Diagnosis Date  . Allergy   . Arthritis   . Cataract   . Diabetes mellitus without complication [facility])    Past Surgical History:  Past Surgical History:  Procedure Laterality Date  . callous removal  Left 2017   located on 1st digit on L foot 2/2 diabetes   HPI:  72 year old female admitted 02/24/18 with LUE weakness and elevated blood pressure.  PMH: DM, HTN, CKD2.  MRI = clustered small foci of acute/early subacute infarction within the right superior frontoparietal junction, punctate infarct in the right insula. Multiple small chronic infarctions cerebellum and basal ganglia.. CXR negative   Assessment / Plan / Recommendation Clinical Impression  The Montreal Cognitive Assessment (MoCA) was administered. Pt scored 29/30 (n=26+/30) indicating performance within functional limits for pt age and education. Pt lost one point on delayed recall subtest, recalling 4/5 words without cues. Given level of independence prior to admit, pt/daughter were encouraged to notify PCP if cognitive linguistic difficulties arise once pt returns to her normal routines. Home health or outpatient therapy may  be beneficial to maximize function and independence. No further ST intervention is recommended at this venue.     SLP Assessment  SLP Recommendation/Assessment: All further Speech Language Pathology needs can be addressed in the next venue of care(if needs arise) SLP Visit Diagnosis: Cognitive communication deficit (R41.841)    Follow Up Recommendations  (only if difficulties arise once pt returns to normal routines)       SLP Evaluation Cognition  Overall Cognitive Status: Within Functional Limits for tasks assessed Orientation Level: Oriented X4 Attention: Focused;Sustained Focused Attention: Appears intact Sustained Attention: Appears intact Memory: Appears intact Awareness: Appears intact Problem Solving: Appears intact Executive Function: Reasoning Reasoning: Appears intact Safety/Judgment: Appears intact       Comprehension  Auditory Comprehension Overall Auditory Comprehension: Appears within functional limits for tasks assessed    Expression Expression Primary Mode of Expression: Verbal Verbal Expression Overall Verbal Expression: Appears within functional limits for tasks assessed   Oral / Motor  Oral Motor/Sensory Function Overall Oral Motor/Sensory Function: Within functional limits Motor Speech Overall Motor Speech: Appears within functional limits for tasks assessed   GO                   Brynnlee Cumpian B. Quentin Ore [facility], CCC-SLP Speech Language Pathologist [phone]  Shonna Chock 02/25/2018, 2:13 PM

## 2018-02-25 NOTE — Progress Notes (Signed)
Patient and daughter spoke with son in law a cardiologist and together decided they do not want to do the TEE scheduled for tomorrow (8/22).   Will continue to monitor patient.

## 2018-02-26 ENCOUNTER — Encounter (HOSPITAL_COMMUNITY)
Admission: EM | Disposition: A | Discharge status: Rehabilitation Facility | Payer: Self-pay | Source: Home / Self Care | Attending: Internal Medicine

## 2018-02-26 ENCOUNTER — Encounter (HOSPITAL_COMMUNITY): Payer: Self-pay | Admitting: Physical Medicine and Rehabilitation

## 2018-02-26 LAB — LIPID PANEL
CHOLESTEROL: 245 mg/dL — AB (ref 0–200)
HDL: 42 mg/dL (ref >40)
LDL Cholesterol: 141 mg/dL — ABNORMAL HIGH (ref 0–99)
TRIGLYCERIDES: 308 mg/dL — AB (ref <150)
Total CHOL/HDL Ratio: 5.8 RATIO
VLDL: 62 mg/dL — ABNORMAL HIGH (ref 0–40)

## 2018-02-26 LAB — GLUCOSE, CAPILLARY
GLUCOSE-CAPILLARY: 175 mg/dL — AB (ref 70–99)
GLUCOSE-CAPILLARY: 170 mg/dL — AB (ref 70–99)
GLUCOSE-CAPILLARY: 179 mg/dL — AB (ref 70–99)
Glucose-Capillary: 146 mg/dL — ABNORMAL HIGH (ref 70–99)

## 2018-02-26 LAB — HEMOGLOBIN A1C
Hgb A1c MFr Bld: 7.4 % — ABNORMAL HIGH (ref 4.8–5.6)
Mean Plasma Glucose: 165.68 mg/dL

## 2018-02-26 LAB — ECHOCARDIOGRAM COMPLETE
Height: 63.25 in
WEIGHTICAEL: 2515.01 [oz_av]

## 2018-02-26 SURGERY — ECHOCARDIOGRAM, TRANSESOPHAGEAL
Anesthesia: Moderate Sedation

## 2018-02-26 MED ORDER — FEXOFENADINE HCL 60 MG PO TABS
60.0000 mg | ORAL_TABLET | Freq: Two times a day (BID) | ORAL | Status: DC
  Administered 2018-02-26 – 2018-02-27 (×3): 60 mg via ORAL
  Filled 2018-02-26 (×3): qty 1

## 2018-02-26 MED ORDER — HYDRALAZINE HCL 20 MG/ML IJ SOLN
10.0000 mg | Freq: Once | INTRAMUSCULAR | Status: AC
  Administered 2018-02-26: 10 mg via INTRAVENOUS
  Filled 2018-02-26: qty 1

## 2018-02-26 MED ORDER — CLOPIDOGREL BISULFATE 75 MG PO TABS: 75.0000 mg | ORAL_TABLET | Freq: Every day | ORAL | 0 refills | Status: DC

## 2018-02-26 MED ORDER — ASPIRIN 81 MG PO TBEC: 81.0000 mg | DELAYED_RELEASE_TABLET | Freq: Every day | ORAL | 1 refills | Status: AC

## 2018-02-26 MED ORDER — NON FORMULARY: 60.0000 mg | Freq: Two times a day (BID) | Status: DC

## 2018-02-26 NOTE — PMR Pre-admission (Signed)
PMR Admission Coordinator Pre-Admission Assessment  Patient: [name] is an 72 y.o., female MRN: [id number] DOB: [birth year] Height: 5' 3.25" (160.7 cm) Weight: 71.3 kg              Insurance Information HMO:     PPO: yes     PCP:      IPA:      80/20:      OTHER: medicare advantage plan PRIMARY: [employer] [employer]      Policy#: [id number]      Subscriber: pt CM Name: Wyman Songster    Phone#: [phone]     Fax#: [phone] Pre-Cert#: [id number] approved for 7 days with f/u Vevelyn Royals     Employer: retired Benefits:  Phone #: [phone]     Name: 02/26/2018 Eff. Date: 08/08/2017     Deduct: none      Out of Pocket Max: $4000      Life Max: none CIR: $160 co pay per day days 1 until 10      SNF: no co pay days 1 until 20; $50 co pay per day days 21 until 100 Outpatient: $20 co pay per visit     Co-Pay: visits per medical necessity Home Health: 100%      Co-Pay: visits per medical neccesity DME: 80%     Co-Pay: 20% Providers: in network  SECONDARY: none     Medicaid Application Date:       Case Manager:  Disability Application Date:       Case Worker:   Emergency Facilities manager Information    Name Relation Home Work [place] Daughter [phone]       Current Medical History  Patient Admitting Diagnosis: right CVA  History of Present Illness:HPI: [name] is a 72 y.o. right-handed female with history of diabetes mellitus, hypertension, CKD stage III.  Presented 02/24/2018 left side weakness as well as systolic blood pressure in the 200s.  MRI reviewed, showing right CVAs. Per report, clustered small foci of acute early subacute infarction within the right superior frontoparietal junction.  Additional punctate infarct in the right insula.  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  MRA showed proximal right M2 superior division short segment of severe stenosis near occlusion no additional large  vessel occlusion identified.  Echocardiogram with  EF 60 to 65%. Patient and family decline TEE and LOOP. Felt to be embolic due to afib. Currently on aspirin and Plavix for CVA prophylaxis.    Complete NIHSS TOTAL: 5    Past Medical History  Past Medical History:  Diagnosis Date  . Allergy   . Arthritis   . Cataract   . Diabetes mellitus without complication ([place])   . Hypertension   . Macular degeneration syndrome    with edema---being treated.     Family History  family history includes Congestive Heart Failure in her father; Diabetes in her maternal aunt; Kidney disease in her mother.  Prior Rehab/Hospitalizations:  Has the patient had major surgery during 100 days prior to admission? No  Current Medications   Current Facility-Administered Medications:  .  0.9 %  sodium chloride infusion, , Intravenous, Continuous, Annita Brod, MD, Last Rate: 10 mL/hr at 02/25/18 0028 .  acetaminophen (TYLENOL) tablet 650 mg, 650 mg, Oral, Q4H PRN **OR** acetaminophen (TYLENOL) solution 650 mg, 650 mg, Per Tube, Q4H PRN **OR** acetaminophen (TYLENOL) suppository 650 mg, 650 mg, Rectal, Q4H PRN, Annita Brod, MD .  aspirin EC tablet 81 mg, 81 mg, Oral, Daily, Biby, Sharon L, NP, 81 mg at 02/26/18 1051 .  clopidogrel (PLAVIX) tablet 75 mg, 75 mg, Oral, Daily, Biby, Sharon L, NP, 75 mg at 02/26/18 1050 .  fexofenadine (ALLEGRA) tablet 60 mg, 60 mg, Oral, BID, Hammons, Kimberly B, RPH, 60 mg at 02/26/18 2243 .  fluticasone (FLONASE) 50 MCG/ACT nasal spray 2 spray, 2 spray, Each Nare, Daily, Annita Brod, MD, 2 spray at 02/26/18 1218 .  insulin aspart (novoLOG) injection 0-5 Units, 0-5 Units, Subcutaneous, QHS, Annita Brod, MD, 2 Units at 02/24/18 2216 .  insulin aspart (novoLOG) injection 0-9 Units, 0-9 Units, Subcutaneous, TID WC, Annita Brod, MD, 2 Units at 02/26/18 1752 .  lisinopril (PRINIVIL,ZESTRIL) tablet 2.5 mg, 2.5 mg, Oral, Daily, Gevena Barre K, MD, 2.5  mg at 02/26/18 1051 .  phenol (CHLORASEPTIC) mouth spray 1 spray, 1 spray, Mouth/Throat, PRN, Amin, Ankit Chirag, MD, 1 spray at 02/25/18 2156 .  senna-docusate (Senokot-S) tablet 1 tablet, 1 tablet, Oral, QHS PRN, Annita Brod, MD  Patients Current Diet:  Diet Order            Diet Carb Modified Fluid consistency: Thin; Room service appropriate? Yes  Diet effective now              Precautions / Restrictions Precautions Precautions: Fall Restrictions Weight Bearing Restrictions: No   Has the patient had 2 or more falls or a fall with injury in the past year?No  Prior Activity Level Limited Community (1-2x/wk): Independent without AD  Home Assistive Devices / [place] Devices/Equipment: None Home Equipment: Walker - 2 wheels, Cane - single point  Prior Device Use: Indicate devices/aids used by the patient prior to current illness, exacerbation or injury? None of the above  Prior Functional Level Prior Function Level of Independence: Independent  Self Care: Did the patient need help bathing, dressing, using the toilet or eating?  Independent  Indoor Mobility: Did the patient need assistance with walking from room to room (with or without device)? Independent  Stairs: Did the patient need assistance with internal or external stairs (with or without device)? Independent  Functional Cognition: Did the patient need help planning regular tasks such as shopping or remembering to take medications? Independent  Current Functional Level Cognition  Overall Cognitive Status: Impaired/Different from baseline Current Attention Level: Alternating Orientation Level: Oriented X4 General Comments: decreased awareness/ attention to left side but can attend with cues. Attention: Focused, Sustained Focused Attention: Appears intact Sustained Attention: Appears intact Memory: Appears intact Awareness: Appears intact Problem Solving: Appears intact Executive  Function: Reasoning Reasoning: Appears intact Safety/Judgment: Appears intact    Extremity Assessment (includes Sensation/Coordination)  Upper Extremity Assessment: LUE deficits/detail LUE Deficits / Details: Noted LUE weakness gross motions 3+/proximal; < 3/5 distally with no active digit extension at this time LUE Sensation: decreased light touch, decreased proprioception LUE Coordination: decreased fine motor, decreased gross motor  Lower Extremity Assessment: Defer to PT evaluation LLE Deficits / Details: good strength upon assessment, but poor functional awareness during task performance/ambulation LLE Sensation: decreased proprioception LLE Coordination: decreased fine motor, decreased gross motor    ADLs  Overall ADL's : Needs assistance/impaired Eating/Feeding: Minimal assistance, Sitting Eating/Feeding Details (indicate cue type and reason): assist to set up tray Grooming: Wash/dry hands, Standing, Minimal assistance Grooming Details (indicate cue type and reason): pt needing assist to bring L UE into sink Upper Body Bathing: Moderate assistance, Sitting Lower Body Bathing: Moderate assistance, Sit  to/from stand Upper Body Dressing : Moderate assistance, Sitting Lower Body Dressing: Moderate assistance Toilet Transfer: Minimal assistance, Ambulation Toileting- Clothing Manipulation and Hygiene: Moderate assistance, Sit to/from stand Functional mobility during ADLs: Minimal assistance General ADL Comments: hand held assist to ambulate in room    Mobility  Overal bed mobility: Needs Assistance Bed Mobility: Supine to Sit Supine to sit: Min assist General bed mobility comments: Sitting in [facility] upon PT arrival.     Transfers  Overall transfer level: Needs assistance Equipment used: None Transfers: Sit to/from Stand, Stand Pivot Transfers Sit to Stand: Min assist Stand pivot transfers: Min assist General transfer comment: Cues to use RUE to push through LUE/LE upon  standing with cues for forward translation. Same way to descend slowly with WB through [place]. Performed x4 from chair. SPT BSC to chair with Min A.     Ambulation / Gait / Stairs / Wheelchair Mobility  Ambulation/Gait Ambulation/Gait assistance: Herbalist (Feet): 60 Feet(x2 bouts) Assistive device: None(rail in hallway) Gait Pattern/deviations: Step-to pattern, Decreased stride length, Decreased weight shift to left, Step-through pattern, Narrow base of support, Decreased step length - left General Gait Details: Very slow, unsteady gait with decrease foot clearance LLE with cues to increase step length and cadence. Left knee instability noted when fatigued. Holding onto rail for support. 1 seated rest break. Poor awareness of LUE. Gait velocity: decreased    Posture / Balance Dynamic Sitting Balance Sitting balance - Comments: difficulty maintaining midline, posterior lean with any dynamic movement, increased time and cues to correct, fatigues. Balance Overall balance assessment: Needs assistance Sitting-balance support: Feet supported, No upper extremity supported Sitting balance-Leahy Scale: Poor Sitting balance - Comments: difficulty maintaining midline, posterior lean with any dynamic movement, increased time and cues to correct, fatigues. Postural control: Posterior lean Standing balance support: During functional activity, Single extremity supported Standing balance-Leahy Scale: Poor Standing balance comment: reliance on UE support in standing, decreased attention to left side    Special needs/care consideration BiPAP/CPAP  N/a CPM n/a Continuous Drip IV n/a Dialysis  N/a Life Vest n/a Oxygen  N/a Special Bed n/a Trach Size n/a Wound Vac n/a Skin tear left arm Bowel mgmt: continent LBM 8/20 * Bladder mgmt: external catheter Diabetic mgmt Hgb A1c 7.3   Previous Home Environment Living Arrangements: Spouse/significant other, Children  Lives With: Spouse,  Daughter Available Help at Discharge: Family, Available 24 hours/day Type of Home: House Home Layout: One level Home Access: Stairs to enter Technical brewer of Steps: 2 Bathroom Shower/Tub: Multimedia programmer: Handicapped height Bathroom Accessibility: Yes How Accessible: Accessible via walker Home Care Services: No  Discharge Living Setting Plans for Discharge Living Setting: Patient's home, Lives with (comment)(spouse and daughter) Type of Home at Discharge: House Discharge Home Layout: One level Discharge Home Access: Stairs to enter Entrance Stairs-Rails: None Entrance Stairs-Number of Steps: 2 Discharge Bathroom Shower/Tub: Walk-in shower Discharge Bathroom Toilet: Handicapped height Discharge Bathroom Accessibility: Yes How Accessible: Accessible via walker Does the patient have any problems obtaining your medications?: No  Social/Family/Support Systems Patient Roles: Spouse, Parent Contact Information: daughter, [name] Anticipated Caregiver: spouse and daughter Anticipated Caregiver's Contact Information: see above Ability/Limitations of Caregiver: none Caregiver Availability: 24/7 Discharge Plan Discussed with Primary Caregiver: Yes Is Caregiver In Agreement with Plan?: Yes Does Caregiver/Family have Issues with Lodging/Transportation while Pt is in Rehab?: No   Son in Sports coach is cardiologist for [employer]  Goals/Additional Needs Patient/Family Goal for Rehab: supervision PT and OT Expected length of  stay: [facility]OS 10 to 14 days Pt/Family Agrees to Admission and willing to participate: Yes Program Orientation Provided & Reviewed with Pt/Caregiver Including Roles  & Responsibilities: Yes  Decrease burden of Care through IP rehab admission: n/a  Possible need for SNF placement upon discharge: not anticipated  Patient Condition: This patient's condition remains as documented in the consult dated 02/25/2018, in which the Rehabilitation Physician  determined and documented that the patient's condition is appropriate for intensive rehabilitative care in an inpatient rehabilitation facility. Will admit to inpatient rehab today.  Preadmission Screen Completed By:  Cleatrice Burke, 02/27/2018 8:25 AM ______________________________________________________________________   Discussed status with Dr. Naaman Plummer on 02/27/2018 at  [facility] and received telephone approval for admission today.  Admission Coordinator:  Cleatrice Burke, time 0905 Date 02/27/2018

## 2018-02-26 NOTE — H&P (Signed)
Physical Medicine and Rehabilitation Admission H&P    Chief Complaint  Patient presents with  . Stroke with functional deficits.    HPI:  [name] is a72 year old RH female with history of HTN, CKD--baseline SCr- 1.6, T2DM.  Per chart review patient lives with spouse and daughter.  Reportedly independent prior to admission.  Son is a cardiologist in [place].  Admitted on 02/24/18 with weakness, LUE weakness with heaviness and elevated BP. SBP>200. MRI/MRA brain done revealing clustered foci of acute/early subacute infarct within right frontoparietal junction with additional punctate infarct right insula and short segment right M2 division severe stenosis/near occlusion and tandem stenosis right A3.  Carotid dopplers without evidence of significant ICA stenosis.  2D echo revealed EF 60-65% with no wall abnormality, severely calcified MV and grade 2 diastolic dysfunction. Dr. Leonie Man felt that stroke was embolic cue to unknown source --A fib suspected but patient declined TEE and loop recorder.  To continue ASA/Plavix for 3 weeks followed by ASA alone.  Physical and occupational therapy evaluations completed with recommendations of physical medicine rehab consult.  Patient was admitted for a comprehensive rehab program.   Review of Systems  Constitutional: Negative for chills and fever.  HENT: Negative for hearing loss and tinnitus.   Eyes: Negative for blurred vision and double vision.       Vision stable and improved in the past year.   Respiratory: Negative for cough and shortness of breath.   Cardiovascular: Negative for chest pain and palpitations.  Gastrointestinal: Negative for abdominal pain, constipation, heartburn and nausea.  Genitourinary: Positive for frequency (past month) and urgency (for the past month.). Negative for dysuria.  Musculoskeletal: Positive for falls (last earlier this year). Negative for back pain, joint pain and myalgias.  Skin: Negative for rash.    Neurological: Positive for sensory change, speech change, focal weakness and weakness (BLE weakness --has to rest frequently). Negative for dizziness and headaches.  Psychiatric/Behavioral: The patient does not have insomnia.     Past Medical History:  Diagnosis Date  . Allergy   . Arthritis   . Cataract   . Diabetes mellitus without complication ([place])   . Macular degeneration syndrome    with edema---being treated.     Past Surgical History:  Procedure Laterality Date  . callous removal  Left 2017   located on 1st digit on L foot 2/2 diabetes    Family History  Problem Relation Age of Onset  . Kidney disease Mother   . Congestive Heart Failure Father   . Diabetes Maternal Aunt     Social History:  Married. Retired from [employer] as Scientist, physiological at [employer] Independent but sedentary.  She reports that she has never smoked. She has never used smokeless tobacco. She reports that she does not drink alcohol or use drugs.    Allergies  Allergen Reactions  . Clonidine Anaphylaxis, Swelling and Other (See Comments)    Tongue swelling  . Codeine Itching and Rash    Medications Prior to Admission  Medication Sig Dispense Refill  . Aflibercept (EYLEA) 2 MG/0.05ML SOLN 1 each by Intravitreal route every 3 (three) months.     Rodman Key EX Take 1 tablet by mouth daily as needed (For pain.).     Marland Kitchen diclofenac sodium (VOLTAREN) 1 % GEL Apply 2 g topically 4 (four) times daily. (Patient taking differently: Apply 2 g topically 4 (four) times daily as needed (For pain.). ) 100 g 0  . fluticasone (FLONASE) 50  MCG/ACT nasal spray Place 2 sprays into both nostrils daily.     Marland Kitchen glucose blood (PRECISION QID TEST) test strip 1 each by Other route as needed (For blood sugar.).     Marland Kitchen lisinopril (PRINIVIL,ZESTRIL) 5 MG tablet Take 1/2 tablet daily (Patient taking differently: Take 2.5 mg by mouth daily. ) 90 tablet 0  . loratadine (CLARITIN) 10 MG tablet Take 10 mg by mouth daily.      . metFORMIN (GLUCOPHAGE) 500 MG tablet Take 500 mg by mouth 2 (two) times daily with a meal.     . TURMERIC PO Take 1 tablet by mouth daily.      Drug Regimen Review Drug regimen was reviewed and remains appropriate with no significant issues identified  Home: Home Living Family/patient expects to be discharged to:: Private residence Living Arrangements: Spouse/significant other, Children Available Help at Discharge: Family Type of Home: House Home Access: Stairs to enter Technical brewer of Steps: 2 Home Layout: One level Bathroom Shower/Tub: Multimedia programmer: Handicapped height Home Equipment: Environmental consultant - 2 wheels, Radio producer - single point  Lives With: Spouse, Daughter   Functional History: Prior Function Level of Independence: Independent  Functional Status:  Mobility: Bed Mobility Overal bed mobility: Needs Assistance Bed Mobility: Supine to Sit Supine to sit: Min assist General bed mobility comments: Sitting in [facility] upon PT arrival.  Transfers Overall transfer level: Needs assistance Equipment used: None Transfers: Sit to/from Stand, [employer] Transfers Sit to Stand: Min assist Stand pivot transfers: Min assist General transfer comment: Cues to use RUE to push through LUE/LE upon standing with cues for forward translation. Same way to descend slowly with WB through [place]. Performed x4 from chair. SPT BSC to chair with Min A.  Ambulation/Gait Ambulation/Gait assistance: Min assist Gait Distance (Feet): 60 Feet(x2 bouts) Assistive device: None(rail in hallway) Gait Pattern/deviations: Step-to pattern, Decreased stride length, Decreased weight shift to left, Step-through pattern, Narrow base of support, Decreased step length - left General Gait Details: Very slow, unsteady gait with decrease foot clearance LLE with cues to increase step length and cadence. Left knee instability noted when fatigued. Holding onto rail for support. 1 seated rest break. Poor  awareness of LUE. Gait velocity: decreased    ADL: ADL Overall ADL's : Needs assistance/impaired Eating/Feeding: Minimal assistance, Sitting Eating/Feeding Details (indicate cue type and reason): assist to set up tray Grooming: Wash/dry hands, Standing, Minimal assistance Grooming Details (indicate cue type and reason): pt needing assist to bring L UE into sink Upper Body Bathing: Moderate assistance, Sitting Lower Body Bathing: Moderate assistance, Sit to/from stand Upper Body Dressing : Moderate assistance, Sitting Lower Body Dressing: Moderate assistance Toilet Transfer: Minimal assistance, Ambulation Toileting- Clothing Manipulation and Hygiene: Moderate assistance, Sit to/from stand Functional mobility during ADLs: Minimal assistance General ADL Comments: hand held assist to ambulate in room  Cognition: Cognition Overall Cognitive Status: Impaired/Different from baseline Orientation Level: Oriented X4 Attention: Focused, Sustained Focused Attention: Appears intact Sustained Attention: Appears intact Memory: Appears intact Awareness: Appears intact Problem Solving: Appears intact Executive Function: Reasoning Reasoning: Appears intact Safety/Judgment: Appears intact Cognition Arousal/Alertness: Awake/alert Behavior During Therapy: WFL for tasks assessed/performed Overall Cognitive Status: Impaired/Different from baseline Area of Impairment: Attention, Problem solving Current Attention Level: Alternating Problem Solving: Requires verbal cues, Requires tactile cues General Comments: decreased awareness/ attention to left side but can attend with cues.  Physical Exam: Blood pressure (!) 165/60, pulse 91, temperature 99.3 F (37.4 C), temperature source Oral, resp. rate 18, height 5' 3.25" (1.607  m), weight 71.3 kg, SpO2 96 %. Physical Exam  Nursing note and vitals reviewed. Constitutional: She is oriented to person, place, and time. She appears well-developed and  well-nourished.     Neck: No tracheal deviation present. No thyromegaly present.  Cardiovascular: Normal rate, regular rhythm and normal heart sounds. Exam reveals no friction rub.  No murmur heard. Respiratory: Effort normal and breath sounds normal. No respiratory distress. She has no wheezes. She has no rales.  GI: Soft. She exhibits no distension. There is no tenderness.  Musculoskeletal:  Trace edema LUE/LLE. Petechial areas LUE.   Neurological: She is alert and oriented to person, place, and time.  Pt sitting up in chair. Reasonable insight and awareness. Functional memory. Normal language. Left central 7, speech minimally dysarthric. LUE 3-/5 prox to distal with decreased [place]. LLE 4/5 prox to distal with some impairment of [place]. Senses pain and LT LLE>LUE.  RUE/RLE motor and sensory normal. .   Skin: Skin is warm.  Psychiatric: She has a normal mood and affect. Her behavior is normal.    Results for orders placed or performed during the hospital encounter of 02/24/18 (from the past 48 hour(s))  CBG monitoring, ED     Status: Abnormal   Collection Time: 02/24/18  1:25 PM  Result Value Ref Range   Glucose-Capillary 158 (H) 70 - 99 mg/dL  Basic metabolic panel     Status: Abnormal   Collection Time: 02/24/18  1:30 PM  Result Value Ref Range   Sodium 144 135 - 145 mmol/L   Potassium 4.0 3.5 - 5.1 mmol/L   Chloride 107 98 - 111 mmol/L   CO2 27 22 - 32 mmol/L   Glucose, Bld 167 (H) 70 - 99 mg/dL   BUN 31 (H) 8 - 23 mg/dL   Creatinine, Ser 1.68 (H) 0.44 - 1.00 mg/dL   Calcium 10.1 8.9 - 10.3 mg/dL   GFR calc non Af Amer 29 (L) >60 mL/min   GFR calc Af Amer 34 (L) >60 mL/min    Comment: (NOTE) The eGFR has been calculated using the CKD EPI equation. This calculation has not been validated in all clinical situations. eGFR's persistently <60 mL/min signify possible Chronic Kidney Disease.    Anion gap 10 5 - 15    Comment: Performed at [facility], [place]  [address] [place], [place] [address]  CBC     Status: None   Collection Time: 02/24/18  1:30 PM  Result Value Ref Range   WBC 8.4 4.0 - 10.5 K/uL   RBC 4.59 3.87 - 5.11 MIL/uL   Hemoglobin 12.6 12.0 - 15.0 g/dL   HCT 38.4 36.0 - 46.0 %   MCV 83.7 78.0 - 100.0 fL   MCH 27.5 26.0 - 34.0 pg   MCHC 32.8 30.0 - 36.0 g/dL   RDW 13.5 11.5 - 15.5 %   Platelets 232 150 - 400 K/uL    Comment: Performed at [facility], [place] [address] [place], [place] [address]  TSH     Status: None   Collection Time: 02/24/18  1:30 PM  Result Value Ref Range   TSH 4.082 0.350 - 4.500 uIU/mL    Comment: Performed by a 3rd Generation assay with a functional sensitivity of <=0.01 uIU/mL. Performed at [facility], [place] [address] [place], [place] [address]   Magnesium     Status: None   Collection Time: 02/24/18  1:30 PM  Result Value Ref Range  Magnesium 2.2 1.7 - 2.4 mg/dL    Comment: Performed at [facility], [place] [address] [place], [place] [address]  Phosphorus     Status: None   Collection Time: 02/24/18  1:30 PM  Result Value Ref Range   Phosphorus 3.6 2.5 - 4.6 mg/dL    Comment: Performed at [facility], [place] [address] [place], [place] [address]  Protime-INR     Status: None   Collection Time: 02/24/18  1:30 PM  Result Value Ref Range   Prothrombin Time 12.7 11.4 - 15.2 seconds   INR 0.96     Comment: Performed at [facility], [place] [address] [place], [place] [address]  Differential     Status: None   Collection Time: 02/24/18  1:30 PM  Result Value Ref Range   Neutrophils Relative % 68 %   Neutro Abs 5.7 1.7 - 7.7 K/uL   Lymphocytes Relative 22 %   Lymphs Abs 1.8 0.7 - 4.0 K/uL   Monocytes Relative 6 %   Monocytes Absolute 0.5 0.1 - 1.0 K/uL   Eosinophils Relative 3 %   Eosinophils Absolute 0.3 0.0 - 0.7 K/uL   Basophils Relative 1 %   Basophils Absolute 0.1 0.0 - 0.1 K/uL    Comment:  Performed at [facility], [place] [address], [place], [place] [address]  Ethanol     Status: None   Collection Time: 02/24/18  2:13 PM  Result Value Ref Range   Alcohol, Ethyl (B) <10 <10 mg/dL    Comment: (NOTE) Lowest detectable limit for serum alcohol is 10 mg/dL. For medical purposes only. Performed at [facility], [place] [address], [place], [place] [address]   I-stat troponin, ED     Status: None   Collection Time: 02/24/18  2:23 PM  Result Value Ref Range   Troponin i, poc 0.02 0.00 - 0.08 ng/mL   Comment 3            Comment: Due to the release kinetics of cTnI, a negative result within the first hours of the onset of symptoms does not rule out myocardial infarction with certainty. If myocardial infarction is still suspected, repeat the test at appropriate intervals.   Urinalysis, Routine w reflex microscopic     Status: Abnormal   Collection Time: 02/24/18  2:36 PM  Result Value Ref Range   Color, Urine STRAW (A) YELLOW   APPearance CLEAR CLEAR   Specific Gravity, Urine 1.006 1.005 - 1.030   pH 6.0 5.0 - 8.0   Glucose, UA 50 (A) NEGATIVE mg/dL   Hgb urine dipstick SMALL (A) NEGATIVE   Bilirubin Urine NEGATIVE NEGATIVE   Ketones, ur NEGATIVE NEGATIVE mg/dL   Protein, ur 100 (A) NEGATIVE mg/dL   Nitrite NEGATIVE NEGATIVE   Leukocytes, UA NEGATIVE NEGATIVE   RBC / HPF 0-5 0 - 5 RBC/hpf   WBC, UA 0-5 0 - 5 WBC/hpf   Bacteria, UA RARE (A) NONE SEEN    Comment: Performed at [facility], [place] [address], [place],  [address]  Urine rapid drug screen (hosp performed)     Status: Abnormal   Collection Time: 02/24/18  2:36 PM  Result Value Ref Range   Opiates (A) NONE DETECTED    Result not available. Reagent lot number recalled by manufacturer.   Cocaine NONE DETECTED NONE DETECTED   Benzodiazepines NONE DETECTED NONE DETECTED   Amphetamines NONE DETECTED NONE DETECTED   Tetrahydrocannabinol NONE  DETECTED NONE  DETECTED   Barbiturates NONE DETECTED NONE DETECTED    Comment: (NOTE) DRUG SCREEN FOR MEDICAL PURPOSES ONLY.  IF CONFIRMATION IS NEEDED FOR ANY PURPOSE, NOTIFY LAB WITHIN 5 DAYS. LOWEST DETECTABLE LIMITS FOR URINE DRUG SCREEN Drug Class                     Cutoff (ng/mL) Amphetamine and metabolites    1000 Barbiturate and metabolites    200 Benzodiazepine                 245 Tricyclics and metabolites     300 Opiates and metabolites        300 Cocaine and metabolites        300 THC                            50 Performed at [facility], [place] [address] [place], [place] [address]   APTT     Status: Abnormal   Collection Time: 02/24/18  3:45 PM  Result Value Ref Range   aPTT 23 (L) 24 - 36 seconds    Comment: Performed at [facility], [place] [address], [place], [place] [address]  CBG monitoring, ED     Status: Abnormal   Collection Time: 02/24/18  9:25 PM  Result Value Ref Range   Glucose-Capillary 215 (H) 70 - 99 mg/dL   Comment 1 Notify RN   Glucose, capillary     Status: Abnormal   Collection Time: 02/25/18  7:42 AM  Result Value Ref Range   Glucose-Capillary 142 (H) 70 - 99 mg/dL  Glucose, capillary     Status: Abnormal   Collection Time: 02/25/18 12:10 PM  Result Value Ref Range   Glucose-Capillary 205 (H) 70 - 99 mg/dL  Glucose, capillary     Status: Abnormal   Collection Time: 02/25/18  4:42 PM  Result Value Ref Range   Glucose-Capillary 167 (H) 70 - 99 mg/dL  Glucose, capillary     Status: Abnormal   Collection Time: 02/25/18  9:51 PM  Result Value Ref Range   Glucose-Capillary 168 (H) 70 - 99 mg/dL  Glucose, capillary     Status: Abnormal   Collection Time: 02/26/18  8:31 AM  Result Value Ref Range   Glucose-Capillary 175 (H) 70 - 99 mg/dL  Glucose, capillary     Status: Abnormal   Collection Time: 02/26/18 12:08 PM  Result Value Ref Range   Glucose-Capillary 170 (H) 70 - 99 mg/dL   [name] Virgel Paling [name] Contrast  Result  Date: 02/24/2018 CLINICAL DATA:  72 y/o F; 2 days of left arm numbness and history of hypertension. EXAM: MRI HEAD WITHOUT CONTRAST MRA HEAD WITHOUT CONTRAST TECHNIQUE: Multiplanar, multiecho pulse sequences of the brain and surrounding structures were obtained without intravenous contrast. Angiographic images of the head were obtained using MRA technique without contrast. COMPARISON:  None. FINDINGS: MRI HEAD FINDINGS Brain: Clustered small foci of reduced diffusion compatible with acute/early subacute infarction in the right superior frontoparietal junction spanning 5.5 x 2.7 x 3.7 cm (AP x ML x CC) additional punctate focus within the right insula. No associated hemorrhage or mass effect. Small chronic infarctions are present within the left cerebellar hemisphere, bilateral lentiform nuclei, and the right anterior corona radiata. Several nonspecific T2 FLAIR hyperintensities in subcortical and periventricular white matter are compatible with mild to moderate chronic microvascular ischemic changes for age. Mild to  moderate volume loss of the brain. No abnormal susceptibility hypointensity to indicate intracranial hemorrhage. No extra-axial collection, hydrocephalus, focal mass effect, or herniation. Vascular: As below. Skull and upper cervical spine: Normal marrow signal. Sinuses/Orbits: Negative. Other: Bilateral intra-ocular lens replacement. MRA HEAD FINDINGS Internal carotid arteries:  Patent. Anterior cerebral arteries: Patent. Right A3 tandem segment of moderate to severe stenosis. Middle cerebral arteries: Patent. Proximal right M2 superior division short segment of severe stenosis/near occlusion (series 6, image 110). Anterior communicating artery: Patent. Posterior communicating arteries: Not identified, likely hypoplastic or absent. Posterior cerebral arteries: Patent. Mild right P2 and mild left P1 short segments of stenosis. Basilar artery:  Patent. Vertebral arteries:  Patent. No additional evidence  of high-grade stenosis, large vessel occlusion, or aneurysm. IMPRESSION: MRI head: 1. Clustered small foci of acute/early subacute infarction within the right superior frontoparietal junction spanning up to 5.5 cm. Additional punctate infarct in the right insula. No associated hemorrhage or mass effect. 2. Background of multiple small chronic infarctions and cerebellum and basal ganglia. Mild-to-moderate chronic microvascular ischemic changes and volume loss of the brain for age. MRA head: 1. Proximal right M2 superior division short segment of severe stenosis/near occlusion. 2. Tandem segments of moderate to severe stenosis in the right A3. 3. Mild right P2 and left P1 short segments of stenosis. 4. No additional large vessel occlusion, high-grade stenosis, or aneurysm identified. These results were called by telephone at the time of interpretation on 02/24/2018 at 5:29 pm to Dr. Lita Mains, who verbally acknowledged these results. Electronically Signed   By: Kristine Garbe M.D.   On: 02/24/2018 17:32   [name] Contrast  Result Date: 02/24/2018 CLINICAL DATA:  72 y/o F; 2 days of left arm numbness and history of hypertension. EXAM: MRI HEAD WITHOUT CONTRAST MRA HEAD WITHOUT CONTRAST TECHNIQUE: Multiplanar, multiecho pulse sequences of the brain and surrounding structures were obtained without intravenous contrast. Angiographic images of the head were obtained using MRA technique without contrast. COMPARISON:  None. FINDINGS: MRI HEAD FINDINGS Brain: Clustered small foci of reduced diffusion compatible with acute/early subacute infarction in the right superior frontoparietal junction spanning 5.5 x 2.7 x 3.7 cm (AP x ML x CC) additional punctate focus within the right insula. No associated hemorrhage or mass effect. Small chronic infarctions are present within the left cerebellar hemisphere, bilateral lentiform nuclei, and the right anterior corona radiata. Several nonspecific T2 FLAIR  hyperintensities in subcortical and periventricular white matter are compatible with mild to moderate chronic microvascular ischemic changes for age. Mild to moderate volume loss of the brain. No abnormal susceptibility hypointensity to indicate intracranial hemorrhage. No extra-axial collection, hydrocephalus, focal mass effect, or herniation. Vascular: As below. Skull and upper cervical spine: Normal marrow signal. Sinuses/Orbits: Negative. Other: Bilateral intra-ocular lens replacement. MRA HEAD FINDINGS Internal carotid arteries:  Patent. Anterior cerebral arteries: Patent. Right A3 tandem segment of moderate to severe stenosis. Middle cerebral arteries: Patent. Proximal right M2 superior division short segment of severe stenosis/near occlusion (series 6, image 110). Anterior communicating artery: Patent. Posterior communicating arteries: Not identified, likely hypoplastic or absent. Posterior cerebral arteries: Patent. Mild right P2 and mild left P1 short segments of stenosis. Basilar artery:  Patent. Vertebral arteries:  Patent. No additional evidence of high-grade stenosis, large vessel occlusion, or aneurysm. IMPRESSION: MRI head: 1. Clustered small foci of acute/early subacute infarction within the right superior frontoparietal junction spanning up to 5.5 cm. Additional punctate infarct in the right insula. No associated hemorrhage or mass effect. 2. Background of multiple  small chronic infarctions and cerebellum and basal ganglia. Mild-to-moderate chronic microvascular ischemic changes and volume loss of the brain for age. MRA head: 1. Proximal right M2 superior division short segment of severe stenosis/near occlusion. 2. Tandem segments of moderate to severe stenosis in the right A3. 3. Mild right P2 and left P1 short segments of stenosis. 4. No additional large vessel occlusion, high-grade stenosis, or aneurysm identified. These results were called by telephone at the time of interpretation on 02/24/2018  at 5:29 pm to Dr. Lita Mains, who verbally acknowledged these results. Electronically Signed   By: Kristine Garbe M.D.   On: 02/24/2018 17:32       Medical Problem List and Plan: 1.  Left-sided weakness secondary to right superior frontal scattered foci and right insular foci infarcts felt to be embolic  -admit to inpatient rehab 2.  DVT Prophylaxis/Anticoagulation: SCDs 3. Pain Management: N/A 4. Mood: LCSW to follow for evaluation and support.  5. Neuropsych: This patient is capable of making decisions on his own behalf. 6. Skin/Wound Care: routine pressure relief measures.  7. Fluids/Electrolytes/Nutrition: Maintain adequate nutritional and hydration status.  8. HTN: Difficult to control PTA. Permissive HTN with gradual normalization--continue low dose lisinopril and titrate upwards as indicated 9. T2DM: Hgb A1C- 7.3.  Check blood sugars before meals and at bedtime.   - Patient had been on Glucophage 500 mg twice daily prior to admission 10.  CKD: Baseline SCr 1.6. Encourage fluid intake. Monitor renal status with serial checks for stability.       Bary Leriche, PA-C 02/26/2018

## 2018-02-26 NOTE — Discharge Summary (Signed)
Physician Discharge Summary  [name] CWC:[id number] DOB: 1946-06-09 DOA: 02/24/2018  PCP: Lucille Passy, MD  Admit date: 02/24/2018 Discharge date: 02/26/2018  Admitted From: Home  Disposition:  CIR  Recommendations for Outpatient Follow-up:  1. Follow up with PCP in 1-2 weeks 2. Please obtain BMP/CBC in one week your next doctors visit.  3. Follow up with Neurology in 4 weeks  4. Take ASA and plavix for 3 weeks followed by ASA alone.   Discharge Condition: Stable CODE STATUS: Full Diet recommendation: Diabetic   Brief/Interim Summary:  72 year old with history of diabetes mellitus type 2, essential hypertension, CKD stage II-3 was sent to the hospital from primary care physician's office for left upper extremity weakness and elevated blood pressure.  Patient's systolic blood pressure was greater than 200 and the primary care physician's office.  Patient underwent evaluation in the ER here and MRI showed acute/early subacute infarction in the right superior frontoparietal junction.  Neurology was consulted and she was admitted for routine stroke work-up.  Carotid Dopplers were noted to be relatively unremarkable.  Echocardiogram showed 60% ejection fraction with grade 2 diastolic dysfunction.  Hemoglobin A1c and lipid panel is still pending at the time of discharge. She was evaluated physical therapy who recommended she would benefit from inpatient rehabilitation.  Advised that she takes aspirin and Plavix for 3 weeks followed by aspirin alone indefinitely.  Needs to follow-up outpatient with neurology in 4 weeks. At this point she is reached maximum benefit from hospital stay and stable to be discharged from inpatient stay to [facility] with outpatient recommendations as stated above.   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  There is an to severe stenosis seen on the right, right 18 moderate to severe stenosis.  Carotid Dopplers shows 1-39% stenosis bilaterally echocardiogram 60% with G2DD - TEE has been ordered by neurology for tomorrow; before this she wants to speak with her son-in-law who is a cardiologist. -Lipid panel and hemoglobin A1c still pending. -Currently undergoing CIR evaluation. -Plan for aspirin and Plavix for 3 weeks followed by aspirin alone. -Spoke with neurology, Dr Leonie Man prior to her discharge.  Hypertensive emergency; improved  Essential Hypertension - Currently blood pressure systolic is 761 range.  Continue to allow this and slowly normalized over next a week.  Currently is on lisinopril 2.5 mg daily. Need to slowly either increase Lisinopril or add Possiblly norvasc over next 7 days with Target SBP ~120.   Diabetes mellitus type 2 -resume home meds   CKD stage III -Currently this is stable.   Macular edema -Secondary to diabetes.  Follow-up outpatient.  DVT prophylaxis: SCDs Code Status: Full Code  Family Communication: Daughter at Bedside   Disposition Plan: Transfer to CIR when bed is available.   Discharge Instructions  Discharge Instructions    Ambulatory referral to Physical Medicine Rehab   Complete by:  As directed    1 month poststroke hospital follow-up     Allergies as of 02/26/2018      Reactions  Clonidine Anaphylaxis, Swelling, Other (See Comments)   Tongue swelling   Codeine Itching, Rash      Medication List    TAKE these medications   ARNICA EX Take 1 tablet by mouth daily as needed (For pain.).   aspirin 81 MG EC  tablet Take 1 tablet (81 mg total) by mouth daily. Start taking on:  02/27/2018   clopidogrel 75 MG tablet Commonly known as:  PLAVIX Take 1 tablet (75 mg total) by mouth daily for 21 days. Start taking on:  02/27/2018   diclofenac sodium 1 % Gel Commonly known as:  VOLTAREN Apply 2 g topically 4 (four) times daily. What changed:    when to take this  reasons to take this   EYLEA 2 MG/0.05ML Soln Generic drug:  Aflibercept 1 each by Intravitreal route every 3 (three) months.   fluticasone 50 MCG/ACT nasal spray Commonly known as:  FLONASE Place 2 sprays into both nostrils daily.   lisinopril 5 MG tablet Commonly known as:  PRINIVIL,ZESTRIL Take 1/2 tablet daily What changed:    how much to take  how to take this  when to take this  additional instructions   loratadine 10 MG tablet Commonly known as:  CLARITIN Take 10 mg by mouth daily.   metFORMIN 500 MG tablet Commonly known as:  GLUCOPHAGE Take 500 mg by mouth 2 (two) times daily with a meal.   PRECISION QID TEST test strip Generic drug:  glucose blood 1 each by Other route as needed (For blood sugar.).   TURMERIC PO Take 1 tablet by mouth daily.      Follow-up Information    Lucille Passy, MD. Schedule an appointment as soon as possible for a visit in 1 week(s).   Specialty:  Family Medicine Contact information: [place] [address] [phone]        Garvin Fila, MD. Schedule an appointment as soon as possible for a visit in 4 day(s).   Specialties:  Neurology, Radiology Contact information: [address] [place] [address] [phone]          Allergies  Allergen Reactions  . Clonidine Anaphylaxis, Swelling and Other (See Comments)    Tongue swelling  . Codeine Itching and Rash    You were cared for by a hospitalist during your hospital stay. If you have any questions about your discharge medications or the care you received while you  were in the hospital after you are discharged, you can call the unit and asked to speak with the hospitalist on call if the hospitalist that took care of you is not available. Once you are discharged, your primary care physician will handle any further medical issues. Please note that no refills for any discharge medications will be authorized once you are discharged, as it is imperative that you return to your primary care physician (or establish a relationship with a primary care physician if you do not have one) for your aftercare needs so that they can reassess your need for medications and monitor your lab values.  Consultations:  Neuro   Procedures/Studies: Dg Chest 2 View  Result Date: 02/18/2018 CLINICAL DATA:  Cough. EXAM: CHEST - 2 VIEW COMPARISON:  None. FINDINGS: The heart size and mediastinal contours are within normal limits. Both lungs are clear. No pneumothorax or pleural effusion is noted. Atherosclerosis of thoracic aorta is noted. The visualized skeletal structures are unremarkable. IMPRESSION: No active cardiopulmonary disease. Aortic Atherosclerosis (ICD10-I70.0). Electronically Signed  By: Marijo Conception, M.D.   On: 02/18/2018 10:54   [name] Head Wo Contrast  Result Date: 02/24/2018 CLINICAL DATA:  72 y/o F; 2 days of left arm numbness and history of hypertension. EXAM: MRI HEAD WITHOUT CONTRAST MRA HEAD WITHOUT CONTRAST TECHNIQUE: Multiplanar, multiecho pulse sequences of the brain and surrounding structures were obtained without intravenous contrast. Angiographic images of the head were obtained using MRA technique without contrast. COMPARISON:  None. FINDINGS: MRI HEAD FINDINGS Brain: Clustered small foci of reduced diffusion compatible with acute/early subacute infarction in the right superior frontoparietal junction spanning 5.5 x 2.7 x 3.7 cm (AP x ML x CC) additional punctate focus within the right insula. No associated hemorrhage or mass effect. Small chronic infarctions  are present within the left cerebellar hemisphere, bilateral lentiform nuclei, and the right anterior corona radiata. Several nonspecific T2 FLAIR hyperintensities in subcortical and periventricular white matter are compatible with mild to moderate chronic microvascular ischemic changes for age. Mild to moderate volume loss of the brain. No abnormal susceptibility hypointensity to indicate intracranial hemorrhage. No extra-axial collection, hydrocephalus, focal mass effect, or herniation. Vascular: As below. Skull and upper cervical spine: Normal marrow signal. Sinuses/Orbits: Negative. Other: Bilateral intra-ocular lens replacement. MRA HEAD FINDINGS Internal carotid arteries:  Patent. Anterior cerebral arteries: Patent. Right A3 tandem segment of moderate to severe stenosis. Middle cerebral arteries: Patent. Proximal right M2 superior division short segment of severe stenosis/near occlusion (series 6, image 110). Anterior communicating artery: Patent. Posterior communicating arteries: Not identified, likely hypoplastic or absent. Posterior cerebral arteries: Patent. Mild right P2 and mild left P1 short segments of stenosis. Basilar artery:  Patent. Vertebral arteries:  Patent. No additional evidence of high-grade stenosis, large vessel occlusion, or aneurysm. IMPRESSION: MRI head: 1. Clustered small foci of acute/early subacute infarction within the right superior frontoparietal junction spanning up to 5.5 cm. Additional punctate infarct in the right insula. No associated hemorrhage or mass effect. 2. Background of multiple small chronic infarctions and cerebellum and basal ganglia. Mild-to-moderate chronic microvascular ischemic changes and volume loss of the brain for age. MRA head: 1. Proximal right M2 superior division short segment of severe stenosis/near occlusion. 2. Tandem segments of moderate to severe stenosis in the right A3. 3. Mild right P2 and left P1 short segments of stenosis. 4. No additional  large vessel occlusion, high-grade stenosis, or aneurysm identified. These results were called by telephone at the time of interpretation on 02/24/2018 at 5:29 pm to Dr. Lita Mains, who verbally acknowledged these results. Electronically Signed   By: Kristine Garbe M.D.   On: 02/24/2018 17:32   [name] Contrast  Result Date: 02/24/2018 CLINICAL DATA:  72 y/o F; 2 days of left arm numbness and history of hypertension. EXAM: MRI HEAD WITHOUT CONTRAST MRA HEAD WITHOUT CONTRAST TECHNIQUE: Multiplanar, multiecho pulse sequences of the brain and surrounding structures were obtained without intravenous contrast. Angiographic images of the head were obtained using MRA technique without contrast. COMPARISON:  None. FINDINGS: MRI HEAD FINDINGS Brain: Clustered small foci of reduced diffusion compatible with acute/early subacute infarction in the right superior frontoparietal junction spanning 5.5 x 2.7 x 3.7 cm (AP x ML x CC) additional punctate focus within the right insula. No associated hemorrhage or mass effect. Small chronic infarctions are present within the left cerebellar hemisphere, bilateral lentiform nuclei, and the right anterior corona radiata. Several nonspecific T2 FLAIR hyperintensities in subcortical and periventricular white matter are compatible with mild to moderate chronic microvascular ischemic changes for age.  Mild to moderate volume loss of the brain. No abnormal susceptibility hypointensity to indicate intracranial hemorrhage. No extra-axial collection, hydrocephalus, focal mass effect, or herniation. Vascular: As below. Skull and upper cervical spine: Normal marrow signal. Sinuses/Orbits: Negative. Other: Bilateral intra-ocular lens replacement. MRA HEAD FINDINGS Internal carotid arteries:  Patent. Anterior cerebral arteries: Patent. Right A3 tandem segment of moderate to severe stenosis. Middle cerebral arteries: Patent. Proximal right M2 superior division short segment of severe  stenosis/near occlusion (series 6, image 110). Anterior communicating artery: Patent. Posterior communicating arteries: Not identified, likely hypoplastic or absent. Posterior cerebral arteries: Patent. Mild right P2 and mild left P1 short segments of stenosis. Basilar artery:  Patent. Vertebral arteries:  Patent. No additional evidence of high-grade stenosis, large vessel occlusion, or aneurysm. IMPRESSION: MRI head: 1. Clustered small foci of acute/early subacute infarction within the right superior frontoparietal junction spanning up to 5.5 cm. Additional punctate infarct in the right insula. No associated hemorrhage or mass effect. 2. Background of multiple small chronic infarctions and cerebellum and basal ganglia. Mild-to-moderate chronic microvascular ischemic changes and volume loss of the brain for age. MRA head: 1. Proximal right M2 superior division short segment of severe stenosis/near occlusion. 2. Tandem segments of moderate to severe stenosis in the right A3. 3. Mild right P2 and left P1 short segments of stenosis. 4. No additional large vessel occlusion, high-grade stenosis, or aneurysm identified. These results were called by telephone at the time of interpretation on 02/24/2018 at 5:29 pm to Dr. Lita Mains, who verbally acknowledged these results. Electronically Signed   By: Kristine Garbe M.D.   On: 02/24/2018 17:32      Subjective: No new complaints.  Continues to feel generally weak otherwise.  Discharge Exam: Vitals:   02/26/18 0607 02/26/18 1050  BP: (!) 151/63 (!) 165/60  Pulse: 91   Resp:    Temp:    SpO2:     Vitals:   02/26/18 0000 02/26/18 0400 02/26/18 0607 02/26/18 1050  BP: (!) 155/61 (!) 196/73 (!) 151/63 (!) 165/60  Pulse: 83 94 91   Resp:      Temp:      TempSrc:      SpO2:      Weight:      Height:        General: Pt is alert, awake, not in acute distress Cardiovascular: RRR, S1/S2 +, no rubs, no gallops Respiratory: CTA bilaterally, no  wheezing, no rhonchi Abdominal: Soft, NT, ND, bowel sounds + Extremities: no edema, no cyanosis Left upper extremity strength 3/5, right upper and lower extremity strength 4/5   The results of significant diagnostics from this hospitalization (including imaging, microbiology, ancillary and laboratory) are listed below for reference.     Microbiology: No results found for this or any previous visit (from the past 240 hour(s)).   Labs: BNP (last 3 results) No results for input(s): BNP in the last 8760 hours. Basic Metabolic Panel: Recent Labs  Lab 02/24/18 1330  NA 144  K 4.0  CL 107  CO2 27  GLUCOSE 167*  BUN 31*  CREATININE 1.68*  CALCIUM 10.1  MG 2.2  PHOS 3.6   Liver Function Tests: No results for input(s): AST, ALT, ALKPHOS, BILITOT, PROT, ALBUMIN in the last 168 hours. No results for input(s): LIPASE, AMYLASE in the last 168 hours. No results for input(s): AMMONIA in the last 168 hours. CBC: Recent Labs  Lab 02/24/18 1330  WBC 8.4  NEUTROABS 5.7  HGB 12.6  HCT 38.4  MCV  83.7  PLT 232   Cardiac Enzymes: No results for input(s): CKTOTAL, CKMB, CKMBINDEX, TROPONINI in the last 168 hours. BNP: Invalid input(s): POCBNP CBG: Recent Labs  Lab 02/25/18 1210 02/25/18 1642 02/25/18 2151 02/26/18 0831 02/26/18 1208  GLUCAP 205* 167* 168* 175* 170*   D-Dimer No results for input(s): DDIMER in the last 72 hours. Hgb A1c No results for input(s): HGBA1C in the last 72 hours. Lipid Profile No results for input(s): CHOL, HDL, LDLCALC, TRIG, CHOLHDL, LDLDIRECT in the last 72 hours. Thyroid function studies Recent Labs    02/24/18 1330  TSH 4.082   Anemia work up No results for input(s): VITAMINB12, FOLATE, FERRITIN, TIBC, IRON, RETICCTPCT in the last 72 hours. Urinalysis    Component Value Date/Time   COLORURINE STRAW (A) 02/24/2018 1436   APPEARANCEUR CLEAR 02/24/2018 1436   LABSPEC 1.006 02/24/2018 1436   PHURINE 6.0 02/24/2018 1436   GLUCOSEU 50  (A) 02/24/2018 1436   HGBUR SMALL (A) 02/24/2018 1436   BILIRUBINUR NEGATIVE 02/24/2018 1436   KETONESUR NEGATIVE 02/24/2018 1436   PROTEINUR 100 (A) 02/24/2018 1436   NITRITE NEGATIVE 02/24/2018 1436   LEUKOCYTESUR NEGATIVE 02/24/2018 1436   Sepsis Labs Invalid input(s): PROCALCITONIN,  WBC,  LACTICIDVEN Microbiology No results found for this or any previous visit (from the past 240 hour(s)).   Time coordinating discharge:  I have spent 35 minutes face to face with the patient and on the ward discussing the patients care, assessment, plan and disposition with other care givers. >50% of the time was devoted counseling the patient about the risks and benefits of treatment/Discharge disposition and coordinating care.   SIGNED:   Damita Lack, MD  [facility] 02/26/2018, 2:37 PM Pager   If 7PM-7AM, please contact night-coverage [email or web address] Password [email or web address]

## 2018-02-26 NOTE — Progress Notes (Addendum)
STROKE TEAM PROGRESS NOTE     INTERVAL HISTORY Her daughter is at the bedside.  She  has decided not to undergo TEE or loop recorder insertion.no new complaints. She has been seen by therapy and inpatient rehabilitation has been recommended  Vitals:   02/26/18 0400 02/26/18 0607 02/26/18 1050 02/26/18 1540  BP: (!) 196/73 (!) 151/63 (!) 165/60 (!) 161/70  Pulse: 94 91  85  Resp:    16  Temp:    98.5 F (36.9 C)  TempSrc:      SpO2:    98%  Weight:      Height:        CBC:  Recent Labs  Lab 02/24/18 1330  WBC 8.4  NEUTROABS 5.7  HGB 12.6  HCT 38.4  MCV 83.7  PLT 767    Basic Metabolic Panel:  Recent Labs  Lab 02/24/18 1330  NA 144  K 4.0  CL 107  CO2 27  GLUCOSE 167*  BUN 31*  CREATININE 1.68*  CALCIUM 10.1  MG 2.2  PHOS 3.6   Lipid Panel: No results found for: CHOL, TRIG, HDL, CHOLHDL, VLDL, LDLCALC HgbA1c:  Lab Results  Component Value Date   HGBA1C 7.3 (H) 09/02/2017   Urine Drug Screen:     Component Value Date/Time   LABOPIA (A) 02/24/2018 1436    Result not available. Reagent lot number recalled by manufacturer.   COCAINSCRNUR NONE DETECTED 02/24/2018 1436   LABBENZ NONE DETECTED 02/24/2018 1436   AMPHETMU NONE DETECTED 02/24/2018 1436   THCU NONE DETECTED 02/24/2018 1436   LABBARB NONE DETECTED 02/24/2018 1436    Alcohol Level     Component Value Date/Time   ETH <10 02/24/2018 1413    IMAGING Mr [name] Head Wo Contrast  Result Date: 02/24/2018 CLINICAL DATA:  72 y/o F; 2 days of left arm numbness and history of hypertension. EXAM: MRI HEAD WITHOUT CONTRAST MRA HEAD WITHOUT CONTRAST TECHNIQUE: Multiplanar, multiecho pulse sequences of the brain and surrounding structures were obtained without intravenous contrast. Angiographic images of the head were obtained using MRA technique without contrast. COMPARISON:  None. FINDINGS: MRI HEAD FINDINGS Brain: Clustered small foci of reduced diffusion compatible with acute/early subacute infarction in  the right superior frontoparietal junction spanning 5.5 x 2.7 x 3.7 cm (AP x ML x CC) additional punctate focus within the right insula. No associated hemorrhage or mass effect. Small chronic infarctions are present within the left cerebellar hemisphere, bilateral lentiform nuclei, and the right anterior corona radiata. Several nonspecific T2 FLAIR hyperintensities in subcortical and periventricular white matter are compatible with mild to moderate chronic microvascular ischemic changes for age. Mild to moderate volume loss of the brain. No abnormal susceptibility hypointensity to indicate intracranial hemorrhage. No extra-axial collection, hydrocephalus, focal mass effect, or herniation. Vascular: As below. Skull and upper cervical spine: Normal marrow signal. Sinuses/Orbits: Negative. Other: Bilateral intra-ocular lens replacement. MRA HEAD FINDINGS Internal carotid arteries:  Patent. Anterior cerebral arteries: Patent. Right A3 tandem segment of moderate to severe stenosis. Middle cerebral arteries: Patent. Proximal right M2 superior division short segment of severe stenosis/near occlusion (series 6, image 110). Anterior communicating artery: Patent. Posterior communicating arteries: Not identified, likely hypoplastic or absent. Posterior cerebral arteries: Patent. Mild right P2 and mild left P1 short segments of stenosis. Basilar artery:  Patent. Vertebral arteries:  Patent. No additional evidence of high-grade stenosis, large vessel occlusion, or aneurysm. IMPRESSION: MRI head: 1. Clustered small foci of acute/early subacute infarction within the right superior frontoparietal junction spanning  up to 5.5 cm. Additional punctate infarct in the right insula. No associated hemorrhage or mass effect. 2. Background of multiple small chronic infarctions and cerebellum and basal ganglia. Mild-to-moderate chronic microvascular ischemic changes and volume loss of the brain for age. MRA head: 1. Proximal right M2 superior  division short segment of severe stenosis/near occlusion. 2. Tandem segments of moderate to severe stenosis in the right A3. 3. Mild right P2 and left P1 short segments of stenosis. 4. No additional large vessel occlusion, high-grade stenosis, or aneurysm identified. These results were called by telephone at the time of interpretation on 02/24/2018 at 5:29 pm to Dr. Lita Mains, who verbally acknowledged these results. Electronically Signed   By: Kristine Garbe M.D.   On: 02/24/2018 17:32   [name] Contrast  Result Date: 02/24/2018 CLINICAL DATA:  72 y/o F; 2 days of left arm numbness and history of hypertension. EXAM: MRI HEAD WITHOUT CONTRAST MRA HEAD WITHOUT CONTRAST TECHNIQUE: Multiplanar, multiecho pulse sequences of the brain and surrounding structures were obtained without intravenous contrast. Angiographic images of the head were obtained using MRA technique without contrast. COMPARISON:  None. FINDINGS: MRI HEAD FINDINGS Brain: Clustered small foci of reduced diffusion compatible with acute/early subacute infarction in the right superior frontoparietal junction spanning 5.5 x 2.7 x 3.7 cm (AP x ML x CC) additional punctate focus within the right insula. No associated hemorrhage or mass effect. Small chronic infarctions are present within the left cerebellar hemisphere, bilateral lentiform nuclei, and the right anterior corona radiata. Several nonspecific T2 FLAIR hyperintensities in subcortical and periventricular white matter are compatible with mild to moderate chronic microvascular ischemic changes for age. Mild to moderate volume loss of the brain. No abnormal susceptibility hypointensity to indicate intracranial hemorrhage. No extra-axial collection, hydrocephalus, focal mass effect, or herniation. Vascular: As below. Skull and upper cervical spine: Normal marrow signal. Sinuses/Orbits: Negative. Other: Bilateral intra-ocular lens replacement. MRA HEAD FINDINGS Internal carotid arteries:   Patent. Anterior cerebral arteries: Patent. Right A3 tandem segment of moderate to severe stenosis. Middle cerebral arteries: Patent. Proximal right M2 superior division short segment of severe stenosis/near occlusion (series 6, image 110). Anterior communicating artery: Patent. Posterior communicating arteries: Not identified, likely hypoplastic or absent. Posterior cerebral arteries: Patent. Mild right P2 and mild left P1 short segments of stenosis. Basilar artery:  Patent. Vertebral arteries:  Patent. No additional evidence of high-grade stenosis, large vessel occlusion, or aneurysm. IMPRESSION: MRI head: 1. Clustered small foci of acute/early subacute infarction within the right superior frontoparietal junction spanning up to 5.5 cm. Additional punctate infarct in the right insula. No associated hemorrhage or mass effect. 2. Background of multiple small chronic infarctions and cerebellum and basal ganglia. Mild-to-moderate chronic microvascular ischemic changes and volume loss of the brain for age. MRA head: 1. Proximal right M2 superior division short segment of severe stenosis/near occlusion. 2. Tandem segments of moderate to severe stenosis in the right A3. 3. Mild right P2 and left P1 short segments of stenosis. 4. No additional large vessel occlusion, high-grade stenosis, or aneurysm identified. These results were called by telephone at the time of interpretation on 02/24/2018 at 5:29 pm to Dr. Lita Mains, who verbally acknowledged these results. Electronically Signed   By: Kristine Garbe M.D.   On: 02/24/2018 17:32   Carotid Doppler   There is 1-39% bilateral ICA stenosis. Vertebral artery flow is antegrade.   2D Echocardiogram  Left ventricle: The cavity size was normal. There was mild   concentric hypertrophy. Systolic function was  normal. The   estimated ejection fraction was in the range of 60% to 65%. Wall   motion was normal; there were no regional wall motion   abnormalities.    TEE Patient refused    PHYSICAL EXAM Pleasant frail elderly not in distress. . Afebrile. Head is nontraumatic. Neck is supple without bruit.    Cardiac exam no murmur or gallop. Lungs are clear to auscultation. Distal pulses are well felt. Neurological Exam ;  Awake  Alert oriented x 3. Normal speech and language.eye movements full without nystagmus.fundi were not visualized. Vision acuity and fields appear normal. Hearing is normal. Palatal movements are normal. Face symmetric. Tongue midline. Mild left upper extremity limited drift with weakness predominantly of the distal hand muscles and dressed 2/5 with 4/5 strength at the shoulder and elbow. No weakness of the legs.. Normal sensation. Gait deferred.  ASSESSMENT/PLAN [name] is a 72 y.o. female with history of diabetes presenting with LUE weakness and numbness x 3 days.   Stroke:  right superior frontal scattered foci and R insular foci infarcts felt to be embolic secondary to unknown source, suspect AF  MRI  Cluster small foci R superior frontoparietal junction and punctate R insular infarct. 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 Small vessel disease. Atrophy.  MRA  prox R M2 severe stenosis, R A3 mod to severe, mild R P2 and L P1 stenosis.  Carotid Doppler  B ICA 1-39% stenosis, VAs antegrade  2D Echo  Left ventricle: The cavity size was normal. There was mild   concentric hypertrophy. Systolic function was normal. The   estimated ejection fraction was in the range of 60% to 65%. Wall   motion was normal; there were no regional wall motion    abnormalities.   TEE  Patient refused  LDL pending   HgbA1c pending   SCDs for VTE prophylaxis  No antithrombotic prior to admission, now on aspirin 325 mg daily. Given  mild stroke, will place on aspirin 81 mg and plavix 75 mg daily x 3 weeks, then aspirin alone. Orders adjusted.   Therapy recommendations:  CIR.  Marland Kitchen  Disposition:  CLR   Hypertensive  Emergency  BP > 200 on arrival to PCP  BP relates elevated but within Permissive hypertension parameters (OK if < 220/120) but gradually normalize in 5-7 days  Difficult to control PTA. Concern for HF.    Marland Kitchen Long-term BP goal normotensive  Diabetes type II  HgbA1c pending , goal < 7.0  Other Stroke Risk Factors  Advanced age  Overweight, Body mass index is 27.62 kg/m., recommend weight loss, diet and exercise as appropriate   Other Active Problems  CKD III  Macular edema d/t diabetes  Hospital day # 1   .  She has presented with embolic right frontal MCA branch infarct and needs further cardiac evaluation with transesophageal echo as well as prolonged cardiac monitoring But she has refused TEE and loop recorder insertion.Continue dual antiplatelet therapy for 3 weeks followed by aspirin alone. Long discussion with the patient and daughter at the bedside and answered questions about her care.she may also consider possible participation in the [place] trial for cryptogenic stroke. She was given written information to review and decide Discussed with Dr. Reesa Chew. Greater than 50% time during this 25 minute visit was spent on counseling and coordination of care about an embolic stroke and answering questions.stroke team will sign off. Follow-up as an outpatient in the stroke clinic in 6 weeks. Kindly call  for questions. Antony Contras, MD Medical Director [facility] Pager: [phone] 02/26/2018 4:04 PM  To contact Stroke Continuity provider, please refer to [email or web address] After hours, contact General Neurology

## 2018-02-26 NOTE — Progress Notes (Signed)
Patient BP 196/79.  Notified triad.  Will continue to monitor the patient and notify as needed.

## 2018-02-26 NOTE — Progress Notes (Signed)
I met with patient and a daughter at bedside. We discussed goals and expectations of an inpt rehab admit. Patient now wishes to proceed with admission to CIR. I will begin insurance authorization for a possible admit once medical work up complete. Patient states she has decided not to have a TEE or LOOP. I will follow up today.  Danne Baxter, RN, MSN Rehab Admissions Coordinator [phone] 02/26/2018 9:03 AM

## 2018-02-26 NOTE — Progress Notes (Signed)
Inpatient Rehabilitation Admissions Coordinator  I await insurance approval to admit pt to inpt rehab. Pt and daughter aware pf pending approval. I will follow up in the morning.  Danne Baxter, RN, MSN Rehab Admissions Coordinator [phone] 02/26/2018 5:12 PM

## 2018-02-26 NOTE — Progress Notes (Signed)
Physical Therapy Treatment Patient Details Name: [name] MRN: [id number] DOB: 03/30/1946 Today's Date: 02/26/2018    History of Present Illness 72 y.o. female who presented to the ED from her PCP's office with LUE weakness and elevated BP. 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     PT Comments    Patient progressing well towards PT goals. Tolerated gait training today with Min A for balance/safety and max cues for LLE. Pt with decreased foot clearance LLE esp when fatigued and cues for more normalized gait pattern. Demonstrates impaired dynamic sitting balance. Pt with improved sensation- with some light touch in LUE/LE today. Continues to demonstrate left inattention during functional tasks but able to attend with cues. Encouraged pt to functionally use LUE throughout the day. Highly motivated to return to PLOF and a great CIR candidate. Will follow.   Follow Up Recommendations  CIR     Equipment Recommendations  None recommended by PT    Recommendations for Other Services       Precautions / Restrictions Precautions Precautions: Fall Restrictions Weight Bearing Restrictions: No    Mobility  Bed Mobility               General bed mobility comments: Sitting in [facility] upon PT arrival.   Transfers Overall transfer level: Needs assistance Equipment used: None Transfers: Sit to/from [facility] Sit to Stand: Min assist Stand pivot transfers: Min assist       General transfer comment: Cues to use RUE to push through LUE/LE upon standing with cues for forward translation. Same way to descend slowly with WB through [place]. Performed x4 from chair. SPT BSC to chair with Min A.   Ambulation/Gait Ambulation/Gait assistance: Min assist Gait Distance (Feet): 60 Feet(x2 bouts) Assistive device: None(rail in  hallway) Gait Pattern/deviations: Step-to pattern;Decreased stride length;Decreased weight shift to left;Step-through pattern;Narrow base of support;Decreased step length - left Gait velocity: decreased   General Gait Details: Very slow, unsteady gait with decrease foot clearance LLE with cues to increase step length and cadence. Left knee instability noted when fatigued. Holding onto rail for support. 1 seated rest break. Poor awareness of LUE.   Stairs             Wheelchair Mobility    Modified Rankin (Stroke Patients Only) Modified Rankin (Stroke Patients Only) Pre-Morbid Rankin Score: Slight disability Modified Rankin: Moderately severe disability     Balance Overall balance assessment: Needs assistance Sitting-balance support: Feet supported;No upper extremity supported Sitting balance-Leahy Scale: Poor Sitting balance - Comments: difficulty maintaining midline, posterior lean with any dynamic movement, increased time and cues to correct, fatigues. Postural control: Posterior lean Standing balance support: During functional activity;Single extremity supported Standing balance-Leahy Scale: Poor Standing balance comment: reliance on UE support in standing, decreased attention to left side                            Cognition Arousal/Alertness: Awake/alert Behavior During Therapy: WFL for tasks assessed/performed Overall Cognitive Status: Impaired/Different from baseline Area of Impairment: Attention;Problem solving                   Current Attention Level: Alternating         Problem Solving: Requires verbal cues;Requires tactile cues General Comments: decreased awareness/ attention to left side but can attend with cues.  Exercises      General Comments General comments (skin integrity, edema, etc.): Daughter present during session.      Pertinent Vitals/Pain Pain Assessment: No/denies pain    Home Living                       Prior Function            PT Goals (current goals can now be found in the care plan section) Progress towards PT goals: Progressing toward goals    Frequency    Min 4X/week      PT Plan Current plan remains appropriate    Co-evaluation              AM-PAC PT "6 Clicks" Daily Activity  Outcome Measure  Difficulty turning over in bed (including adjusting bedclothes, sheets and blankets)?: A Little Difficulty moving from lying on back to sitting on the side of the bed? : Unable Difficulty sitting down on and standing up from a chair with arms (e.g., wheelchair, bedside commode, etc,.)?: Unable Help needed moving to and from a bed to chair (including a wheelchair)?: A Little Help needed walking in hospital room?: A Little Help needed climbing 3-5 steps with a railing? : A Lot 6 Click Score: 13    End of Session Equipment Utilized During Treatment: Gait belt Activity Tolerance: Patient tolerated treatment well Patient left: in chair;with call bell/phone within reach;with family/visitor present;with nursing/sitter in room Nurse Communication: Mobility status PT Visit Diagnosis: Difficulty in walking, not elsewhere classified (R26.2);Other symptoms and signs involving the nervous system (R29.898);Hemiplegia and hemiparesis Hemiplegia - Right/Left: Left Hemiplegia - dominant/non-dominant: Non-dominant Hemiplegia - caused by: Cerebral infarction     Time: 1021-1053 PT Time Calculation (min) (ACUTE ONLY): 32 min  Charges:  $Gait Training: 8-22 mins $Neuromuscular Re-education: 8-22 mins                     [place], [place], [place] [phone]     Marguarite Arbour A Ladasha Schnackenberg 02/26/2018, 11:11 AM

## 2018-02-27 ENCOUNTER — Other Ambulatory Visit: Payer: Self-pay

## 2018-02-27 ENCOUNTER — Encounter (HOSPITAL_COMMUNITY): Payer: Self-pay

## 2018-02-27 ENCOUNTER — Inpatient Hospital Stay (HOSPITAL_COMMUNITY)
Admission: RE | Admit: 2018-02-27 | Discharge: 2018-03-14 | DRG: 057 | Disposition: A | Discharge status: Home Health | Payer: Medicare Other | Source: Intra-hospital | Attending: Physical Medicine & Rehabilitation | Admitting: Physical Medicine & Rehabilitation

## 2018-02-27 DIAGNOSIS — N183 Chronic kidney disease, stage 3 unspecified: Secondary | ICD-10-CM

## 2018-02-27 DIAGNOSIS — E119 Type 2 diabetes mellitus without complications: Secondary | ICD-10-CM | POA: Diagnosis not present

## 2018-02-27 DIAGNOSIS — I6349 Cerebral infarction due to embolism of other cerebral artery: Secondary | ICD-10-CM | POA: Diagnosis not present

## 2018-02-27 DIAGNOSIS — I69354 Hemiplegia and hemiparesis following cerebral infarction affecting left non-dominant side: Principal | ICD-10-CM

## 2018-02-27 DIAGNOSIS — G8194 Hemiplegia, unspecified affecting left nondominant side: Secondary | ICD-10-CM | POA: Diagnosis not present

## 2018-02-27 DIAGNOSIS — I63419 Cerebral infarction due to embolism of unspecified middle cerebral artery: Secondary | ICD-10-CM | POA: Diagnosis not present

## 2018-02-27 DIAGNOSIS — Z8249 Family history of ischemic heart disease and other diseases of the circulatory system: Secondary | ICD-10-CM

## 2018-02-27 DIAGNOSIS — I4891 Unspecified atrial fibrillation: Secondary | ICD-10-CM | POA: Diagnosis present

## 2018-02-27 DIAGNOSIS — I169 Hypertensive crisis, unspecified: Secondary | ICD-10-CM

## 2018-02-27 DIAGNOSIS — Z841 Family history of disorders of kidney and ureter: Secondary | ICD-10-CM | POA: Diagnosis not present

## 2018-02-27 DIAGNOSIS — Z833 Family history of diabetes mellitus: Secondary | ICD-10-CM | POA: Diagnosis not present

## 2018-02-27 DIAGNOSIS — E1122 Type 2 diabetes mellitus with diabetic chronic kidney disease: Secondary | ICD-10-CM | POA: Diagnosis present

## 2018-02-27 DIAGNOSIS — I129 Hypertensive chronic kidney disease with stage 1 through stage 4 chronic kidney disease, or unspecified chronic kidney disease: Secondary | ICD-10-CM | POA: Diagnosis present

## 2018-02-27 DIAGNOSIS — Z7984 Long term (current) use of oral hypoglycemic drugs: Secondary | ICD-10-CM

## 2018-02-27 DIAGNOSIS — I10 Essential (primary) hypertension: Secondary | ICD-10-CM | POA: Diagnosis not present

## 2018-02-27 DIAGNOSIS — I6310 Cerebral infarction due to embolism of unspecified precerebral artery: Secondary | ICD-10-CM | POA: Diagnosis not present

## 2018-02-27 DIAGNOSIS — N184 Chronic kidney disease, stage 4 (severe): Secondary | ICD-10-CM

## 2018-02-27 DIAGNOSIS — I639 Cerebral infarction, unspecified: Secondary | ICD-10-CM | POA: Diagnosis present

## 2018-02-27 LAB — GLUCOSE, CAPILLARY
GLUCOSE-CAPILLARY: 153 mg/dL — AB (ref 70–99)
Glucose-Capillary: 174 mg/dL — ABNORMAL HIGH (ref 70–99)

## 2018-02-27 MED ORDER — HYDRALAZINE HCL 10 MG PO TABS
10.0000 mg | ORAL_TABLET | Freq: Once | ORAL | Status: AC
  Administered 2018-02-27: 10 mg via ORAL
  Filled 2018-02-27: qty 1

## 2018-02-27 MED ORDER — PROCHLORPERAZINE MALEATE 5 MG PO TABS: 5.0000 mg | ORAL_TABLET | Freq: Four times a day (QID) | ORAL | Status: DC | PRN

## 2018-02-27 MED ORDER — PHENOL 1.4 % MT LIQD: 1.0000 | OROMUCOSAL | Status: DC | PRN

## 2018-02-27 MED ORDER — GUAIFENESIN-DM 100-10 MG/5ML PO SYRP: 5.0000 mL | ORAL_SOLUTION | Freq: Four times a day (QID) | ORAL | Status: DC | PRN

## 2018-02-27 MED ORDER — INSULIN ASPART 100 UNIT/ML ~~LOC~~ SOLN
0.0000 [IU] | Freq: Every day | SUBCUTANEOUS | Status: DC
  Administered 2018-03-11: 2 [IU] via SUBCUTANEOUS

## 2018-02-27 MED ORDER — ALUM & MAG HYDROXIDE-SIMETH 200-200-20 MG/5ML PO SUSP: 30.0000 mL | ORAL | Status: DC | PRN

## 2018-02-27 MED ORDER — INSULIN ASPART 100 UNIT/ML ~~LOC~~ SOLN
0.0000 [IU] | Freq: Three times a day (TID) | SUBCUTANEOUS | Status: DC
  Administered 2018-02-27: 3 [IU] via SUBCUTANEOUS
  Administered 2018-02-28: 2 [IU] via SUBCUTANEOUS
  Administered 2018-02-28: 1 [IU] via SUBCUTANEOUS
  Administered 2018-02-28: 2 [IU] via SUBCUTANEOUS
  Administered 2018-03-01 – 2018-03-03 (×6): 1 [IU] via SUBCUTANEOUS
  Administered 2018-03-04: 2 [IU] via SUBCUTANEOUS
  Administered 2018-03-04 – 2018-03-13 (×12): 1 [IU] via SUBCUTANEOUS

## 2018-02-27 MED ORDER — DIPHENHYDRAMINE HCL 12.5 MG/5ML PO ELIX: 12.5000 mg | ORAL_SOLUTION | Freq: Four times a day (QID) | ORAL | Status: DC | PRN

## 2018-02-27 MED ORDER — FLEET ENEMA 7-19 GM/118ML RE ENEM: 1.0000 | ENEMA | Freq: Once | RECTAL | Status: DC | PRN

## 2018-02-27 MED ORDER — NON FORMULARY: 60.0000 mg | Freq: Two times a day (BID) | Status: DC

## 2018-02-27 MED ORDER — BLOOD PRESSURE CONTROL BOOK
Freq: Once | Status: AC
  Administered 2018-02-27: 18:00:00
  Filled 2018-02-27: qty 1

## 2018-02-27 MED ORDER — CLOPIDOGREL BISULFATE 75 MG PO TABS
75.0000 mg | ORAL_TABLET | Freq: Every day | ORAL | Status: DC
  Administered 2018-02-28 – 2018-03-14 (×15): 75 mg via ORAL
  Filled 2018-02-27 (×15): qty 1

## 2018-02-27 MED ORDER — SENNOSIDES-DOCUSATE SODIUM 8.6-50 MG PO TABS: 1.0000 | ORAL_TABLET | Freq: Every evening | ORAL | Status: DC | PRN

## 2018-02-27 MED ORDER — POLYETHYLENE GLYCOL 3350 17 G PO PACK: 17.0000 g | PACK | Freq: Every day | ORAL | Status: DC | PRN

## 2018-02-27 MED ORDER — ACETAMINOPHEN 325 MG PO TABS: 325.0000 mg | ORAL_TABLET | ORAL | Status: DC | PRN

## 2018-02-27 MED ORDER — BISACODYL 10 MG RE SUPP: 10.0000 mg | Freq: Every day | RECTAL | Status: DC | PRN

## 2018-02-27 MED ORDER — FLUTICASONE PROPIONATE 50 MCG/ACT NA SUSP
2.0000 | Freq: Every day | NASAL | Status: DC
  Administered 2018-02-27 – 2018-03-14 (×15): 2 via NASAL
  Filled 2018-02-27: qty 16

## 2018-02-27 MED ORDER — LISINOPRIL 2.5 MG PO TABS
2.5000 mg | ORAL_TABLET | Freq: Every day | ORAL | Status: DC
  Administered 2018-02-28: 2.5 mg via ORAL
  Filled 2018-02-27 (×2): qty 1

## 2018-02-27 MED ORDER — ATORVASTATIN CALCIUM 40 MG PO TABS: 40.0000 mg | ORAL_TABLET | Freq: Every day | ORAL | 0 refills | Status: DC

## 2018-02-27 MED ORDER — PROCHLORPERAZINE EDISYLATE 10 MG/2ML IJ SOLN: 5.0000 mg | Freq: Four times a day (QID) | INTRAMUSCULAR | Status: DC | PRN

## 2018-02-27 MED ORDER — LIVING WELL WITH DIABETES BOOK
Freq: Once | Status: AC
  Administered 2018-02-27: 18:00:00
  Filled 2018-02-27: qty 1

## 2018-02-27 MED ORDER — TRAZODONE HCL 50 MG PO TABS
25.0000 mg | ORAL_TABLET | Freq: Every evening | ORAL | Status: DC | PRN
  Administered 2018-02-28: 50 mg via ORAL
  Filled 2018-02-27: qty 1

## 2018-02-27 MED ORDER — PROCHLORPERAZINE 25 MG RE SUPP: 12.5000 mg | Freq: Four times a day (QID) | RECTAL | Status: DC | PRN

## 2018-02-27 MED ORDER — FEXOFENADINE HCL 60 MG PO TABS
60.0000 mg | ORAL_TABLET | Freq: Two times a day (BID) | ORAL | Status: DC
  Administered 2018-02-27 – 2018-03-01 (×3): 60 mg via ORAL
  Filled 2018-02-27 (×4): qty 1

## 2018-02-27 MED ORDER — ASPIRIN EC 81 MG PO TBEC
81.0000 mg | DELAYED_RELEASE_TABLET | Freq: Every day | ORAL | Status: DC
  Administered 2018-02-27 – 2018-03-14 (×16): 81 mg via ORAL
  Filled 2018-02-27 (×15): qty 1

## 2018-02-27 NOTE — Progress Notes (Signed)
Inpatient Rehabilitation Admissions Coordinator  I have insurance approval and am admitting patient to inpt rehab today. I met with pt and her daughter at bedside and they are aware and in agreement, I have notified nursing, Dr. Reesa Chew and RN CM, Levada Dy. I will make the arrangements to admit today.  Danne Baxter, RN, MSN Rehab Admissions Coordinator [phone] 02/27/2018 8:22 AM

## 2018-02-27 NOTE — Progress Notes (Signed)
Jamse Arn, MD    Jamse Arn, MD  Physician  Physical Medicine and Rehabilitation      Consult Note  Signed     Date of Service:  02/25/2018 10:18 AM         Related encounter: ED to Hosp-Admission (Discharged) from 02/24/2018 in [facility]             Signed          Expand All Collapse All            Expand widget buttonCollapse widget button    Show:Clear all   ManualTemplateCopied  Added by:     Angiulli, Lavon Paganini, PA-C  Jamse Arn, MD   Hover for detailscustomization button                                                                                                                                                       untitled image              Physical Medicine and Rehabilitation Consult  Reason for Consult: Left side weakness  Referring Physician: [facility]        HPI: [name] is a 72 y.o. right-handed female with history of diabetes mellitus, hypertension, CKD stage III.  History taken from chart review, patient, and daughter. Lives with spouse and daughter.  Reportedly independent prior to admission.  One level home with 2 steps to entry.  Son-in-law is a cardiologist in [place].  Presented 02/24/2018 left side weakness as well as systolic blood pressure in the 200s.  MRI reviewed, showing right CVAs. Per report, clustered small foci of acute early subacute infarction within the right superior frontoparietal junction.  Additional punctate infarct in the right insula.  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  MRA showed proximal right M2 superior division short segment of severe stenosis near occlusion no additional large vessel occlusion identified.   Echocardiogram is pending.  Await plan for TEE possible loop recorder.  Currently on aspirin and Plavix for CVA prophylaxis.  Therapy evaluations completed with recommendations of physical medicine rehab consult.     Review of Systems   Constitutional: Negative for chills and fever.   HENT: Negative for hearing loss.    Eyes: Negative for blurred vision and double vision.   Respiratory: Negative for cough and shortness of breath.    Cardiovascular: Negative for chest pain and palpitations.   Gastrointestinal: Positive for constipation. Negative for nausea.   Genitourinary: Negative for dysuria, flank pain and hematuria.   Musculoskeletal: Positive for joint pain and myalgias.   Skin: Negative for rash.   Neurological: Positive for focal weakness. Negative  for speech change.   All other systems reviewed and are negative.          Past Medical History:    Diagnosis   Date    .   Allergy        .   Arthritis        .   Cataract        .   Diabetes mellitus without complication [facility])                 Past Surgical History:    Procedure   Laterality   Date    .   callous removal    Left   2017        located on 1st digit on L foot 2/2 diabetes             Family History    Problem   Relation   Age of Onset    .   Kidney disease   Mother        .   Congestive Heart Failure   Father        .   Diabetes   Maternal Aunt           Social History:  reports that she has never smoked. She has never used smokeless tobacco. She reports that she does not drink alcohol or use drugs.  Allergies:         Allergies    Allergen   Reactions    .   Clonidine   Anaphylaxis, Swelling and Other (See Comments)            Tongue swelling    .   Codeine   Itching and Rash              Medications Prior to Admission    Medication   Sig   Dispense   Refill    .    Aflibercept (EYLEA) 2 MG/0.05ML SOLN   1 each by Intravitreal route every 3 (three) months.             [place] EX   Take 1 tablet by mouth daily as needed (For pain.).             Marland Kitchen   diclofenac sodium (VOLTAREN) 1 % GEL   Apply 2 g topically 4 (four) times daily. (Patient taking differently: Apply 2 g topically 4 (four) times daily as needed (For pain.). )   100 g   0    .   fluticasone (FLONASE) 50 MCG/ACT nasal spray   Place 2 sprays into both nostrils daily.             Marland Kitchen   glucose blood (PRECISION QID TEST) test strip   1 each by Other route as needed (For blood sugar.).             Marland Kitchen   lisinopril (PRINIVIL,ZESTRIL) 5 MG tablet   Take 1/2 tablet daily (Patient taking differently: Take 2.5 mg by mouth daily. )   90 tablet   0    .   loratadine (CLARITIN) 10 MG tablet   Take 10 mg by mouth daily.             .   metFORMIN (GLUCOPHAGE) 500 MG tablet   Take 500 mg by mouth 2 (two) times daily with a meal.             .   TURMERIC  PO   Take 1 tablet by mouth daily.                  Home:  Home Living  Family/patient expects to be discharged to:: Private residence  Living Arrangements: Spouse/significant other, Children  Available Help at Discharge: Family  Type of Home: House  Home Access: Stairs to enter  Technical brewer of Steps: 2  [place]: One level  Bathroom Shower/Tub: Walk-in Designer, television/film set: Handicapped height  [place]: Environmental consultant - 2 wheels, [employer] - single point   Functional History:  Prior Function  Level of Independence: Independent  Functional Status:   Mobility:  Bed Mobility  Overal bed mobility: Needs Assistance  Bed Mobility: Supine to Sit  Supine to sit: Min assist  General bed mobility comments: signficant time to perform, HOB elevated and max multi modal cues to turn to side with physical assist for elevation of trunk to  upright.   Transfers  Overall transfer level: Needs assistance  Equipment used: 1 person hand held assist  Transfers: Sit to/from Stand  Sit to Stand: Min assist  General transfer comment: Min assist to power to upright, with blocking of LLE and Vcs for positioning.   Ambulation/Gait  Ambulation/Gait assistance: Min assist, +2 physical assistance  Gait Distance (Feet): 18 Feet  Assistive device: 2 person hand held assist  Gait Pattern/deviations: Step-to pattern, Decreased stride length, Decreased weight shift to left  General Gait Details: patient with difficulty shifting weight for LE advancement, noted LLE lag and heavy list/reliance to the right. Assist provided. Poor awareness of left UE.  Gait velocity: decreased       ADL:  ADL  Overall ADL's : Needs assistance/impaired  Eating/Feeding: Minimal assistance, Sitting  Eating/Feeding Details (indicate cue type and reason): assist to set up tray  Grooming: Wash/dry hands, Standing, Minimal assistance  Grooming Details (indicate cue type and reason): pt needing assist to bring L UE into sink  Upper Body Bathing: Moderate assistance, Sitting  Lower Body Bathing: Moderate assistance, Sit to/from stand  Upper Body Dressing : Moderate assistance, Sitting  Lower Body Dressing: Moderate assistance  Toilet Transfer: Minimal assistance, Ambulation  Toileting- Clothing Manipulation and Hygiene: Moderate assistance, Sit to/from stand  Functional mobility during ADLs: Minimal assistance  General ADL Comments: hand held assist to ambulate in room     Cognition:  Cognition  Overall Cognitive Status: Impaired/Different from baseline  Orientation Level: Oriented X4  Cognition  Arousal/Alertness: Awake/alert  Behavior During Therapy: WFL for tasks assessed/performed  Overall Cognitive Status: Impaired/Different from baseline  Area of Impairment: Attention, Problem solving  Current Attention Level:  Alternating  Problem Solving: Requires verbal cues, Requires tactile cues  General Comments: decreased awareness/ attention to left side but can attend with cues.     Blood pressure (!) 176/57, pulse 83, temperature 99.2 F (37.3 C), temperature source Oral, resp. rate 18, height 5' 3.25" (1.607 m), weight 71.3 kg, SpO2 97 %.  Physical Exam   Vitals reviewed.  Constitutional: She is oriented to person, place, and time. She appears well-developed and well-nourished.   HENT:   Head: Normocephalic and atraumatic.   Eyes: EOM are normal. Right eye exhibits no discharge. Left eye exhibits no discharge.   Neck: Normal range of motion. Neck supple. No thyromegaly present.   Cardiovascular: Normal rate, regular rhythm and normal heart sounds.   Respiratory: Effort normal and breath sounds normal. No respiratory distress.   GI: Soft. Bowel sounds are normal. She exhibits  no distension.  Musculoskeletal:  No edema or tenderness in extremities  Neurological: She is alert and oriented to person, place, and time.  Follows commands.   Makes good eye contact with examiner.   Fair awareness of deficits. Motor: Right upper extremity: Shoulder abduction 3/5, elbow flexion/extension 4 -/5, wrist extension 2/5, hand grip 3/5 Right lower extremity: 4 -/5 proximal distal Left upper extremity/left lower extremity: Grossly 5/5 proximal distal   Skin: Skin is warm and dry.  Psychiatric: She has a normal mood and affect. Her behavior is normal.         Lab Results Last 24 Hours  Imaging Results (Last 48 hours)                              Assessment/Plan:  Diagnosis: multiple right CVA  Labs and images (see above) independently reviewed.  Records reviewed and summated above.  Stroke:  Continue secondary stroke prophylaxis and Risk Factor Modification listed below:    Antiplatelet therapy:    Blood Pressure Management:  Continue  current medication with prn's with permisive HTN per primary team  Statin Agent:    Diabetes management:    Left sided hemiparesis: fit for orthosis to prevent contractures (resting hand splint for day, wrist cock up splint at night, etc)  Motor recovery: Fluoxetine     1.Does the need for close, 24 hr/day medical supervision in concert with the patient's rehab needs make it unreasonable for this patient to be served in a less intensive setting? Yes   2.Co-Morbidities requiring supervision/potential complications: DM (Monitor in accordance with exercise and adjust meds as necessary), HTN (monitor and provide prns in accordance with increased physical exertion and pain), CKD stage III (avoid nephrotoxic meds)   3.Due to safety, disease management and patient education, does the patient require 24 hr/day rehab nursing? Yes   4.Does the patient require coordinated care of a physician, rehab nurse, PT (1-2 hrs/day, 5 days/week) and OT (1-2 hrs/day, 5 days/week) to address physical and functional deficits in the context of the above medical diagnosis(es)? Yes Addressing deficits in the following areas: balance, endurance, locomotion, strength, transferring, bathing, dressing, toileting and psychosocial support   5.Can the patient actively participate in an intensive therapy program of at least 3 hrs of therapy per day at least 5 days per week? Yes   6.The potential for patient to make measurable gains while on inpatient rehab is excellent   7.Anticipated functional outcomes upon discharge from inpatient rehab are supervision  with PT, supervision with OT, n/a with SLP.   8.Estimated rehab length of stay to reach the above functional goals is: 10-14 days.   9.Anticipated D/C setting: Home   10.Anticipated post D/C treatments: HH therapy and Home excercise program   11.Overall Rehab/Functional Prognosis: good      RECOMMENDATIONS:  This patient's condition is appropriate  for continued rehabilitative care in the following setting: Recommend CIR, however patient refusing at present. She states that she would like to speak with her daughter., but hopes to go home with home health. recommend PM&R outpatient follow-up.  Patient has agreed to participate in recommended program. Yes  Note that insurance prior authorization may be required for reimbursement for recommended care.     Comment: Rehab Admissions Coordinator to follow up.        I have personally performed a face to face diagnostic evaluation, including, but not limited to relevant history and physical exam findings, of this patient and developed relevant assessment and plan.  Additionally, I have reviewed and concur with the physician assistant's documentation above.      Delice Lesch, MD, ABPMR  Lavon Paganini Angiulli, PA-C  02/25/2018                Revision History                                        Routing History

## 2018-02-27 NOTE — Progress Notes (Signed)
[name] Gong, RN  Rehab Admission Coordinator  Physical Medicine and Rehabilitation  PMR Pre-admission  Signed  Date of Service:  02/26/2018 1:04 PM       Related encounter: ED to Hosp-Admission (Discharged) from 02/24/2018 in [place]         Show:Clear all [x] Manual[x] Template[x] Copied  Added by: [x] Julious Payer Vertis Kelch, RN  [] Hover for details PMR Admission Coordinator Pre-Admission Assessment  Patient: [name] is an 72 y.o., female MRN: [id number] DOB: January 19, 1946 Height: 5' 3.25" (160.7 cm) Weight: 71.3 kg                                                                                                                                                  Insurance Information HMO:     PPO: yes     PCP:      IPA:      80/20:      OTHER: medicare advantage plan PRIMARY: [place] [employer]      Policy#: [id number]      Subscriber: pt CM Name: [name]    Phone#: [phone]     Fax#: [phone] Pre-Cert#: [id number] approved for 7 days with f/u Vevelyn Royals     Employer: retired Benefits:  Phone #: [phone]     Name: 02/26/2018 Eff. Date: 08/08/2017     Deduct: none      Out of Pocket Max: $4000      Life Max: none CIR: $160 co pay per day days 1 until 10      SNF: no co pay days 1 until 20; $50 co pay per day days 21 until 100 Outpatient: $20 co pay per visit     Co-Pay: visits per medical necessity Home Health: 100%      Co-Pay: visits per medical neccesity DME: 80%     Co-Pay: 20% Providers: in network  SECONDARY: none     Medicaid Application Date:       Case Manager:  Disability Application Date:       Case Worker:   Emergency Contact Information         Contact Information    Name Relation Home Work [place] Daughter [phone]       Current Medical History  Patient Admitting Diagnosis: right CVA  History of Present Illness:HPI:[name] Ramseyis a 72 y.o.right-handed  femalewith history of diabetes mellitus, hypertension, CKD stageIII.Presented 02/24/2018 left side weakness as well as systolic blood pressure in the 200s. MRI reviewed, showing right CVAs. Per report,clustered small foci of acute early subacute infarction within the right superior frontoparietal junction. Additional punctate infarct in the right insula. 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 MRA showed proximal right M2 superior division short segment of severe stenosis near occlusion no additional large  vessel occlusion identified. Echocardiogram with  EF 60 to 65%. Patient and family decline TEE and LOOP. Felt to be embolic due to afib.Currently on aspirin and Plavix for CVA prophylaxis.   Complete NIHSS TOTAL: 5  Past Medical History      Past Medical History:  Diagnosis Date  . Allergy   . Arthritis   . Cataract   . Diabetes mellitus without complication ([place])   . Hypertension   . Macular degeneration syndrome    with edema---being treated.     Family History  family history includes Congestive Heart Failure in her father; Diabetes in her maternal aunt; Kidney disease in her mother.  Prior Rehab/Hospitalizations:  Has the patient had major surgery during 100 days prior to admission? No  Current Medications   Current Facility-Administered Medications:  .  0.9 %  sodium chloride infusion, , Intravenous, Continuous, Annita Brod, MD, Last Rate: 10 mL/hr at 02/25/18 0028 .  acetaminophen (TYLENOL) tablet 650 mg, 650 mg, Oral, Q4H PRN **OR** acetaminophen (TYLENOL) solution 650 mg, 650 mg, Per Tube, Q4H PRN **OR** acetaminophen (TYLENOL) suppository 650 mg, 650 mg, Rectal, Q4H PRN, Annita Brod, MD .  aspirin EC tablet 81 mg, 81 mg, Oral, Daily, Biby, Sharon L, NP, 81 mg at 02/26/18 1051 .  clopidogrel (PLAVIX) tablet 75 mg, 75 mg, Oral, Daily, Biby, Sharon L, NP, 75 mg at 02/26/18 1050 .  fexofenadine  (ALLEGRA) tablet 60 mg, 60 mg, Oral, BID, Hammons, Kimberly B, RPH, 60 mg at 02/26/18 2243 .  fluticasone (FLONASE) 50 MCG/ACT nasal spray 2 spray, 2 spray, Each Nare, Daily, Annita Brod, MD, 2 spray at 02/26/18 1218 .  insulin aspart (novoLOG) injection 0-5 Units, 0-5 Units, Subcutaneous, QHS, Annita Brod, MD, 2 Units at 02/24/18 2216 .  insulin aspart (novoLOG) injection 0-9 Units, 0-9 Units, Subcutaneous, TID WC, Annita Brod, MD, 2 Units at 02/26/18 1752 .  lisinopril (PRINIVIL,ZESTRIL) tablet 2.5 mg, 2.5 mg, Oral, Daily, Gevena Barre K, MD, 2.5 mg at 02/26/18 1051 .  phenol (CHLORASEPTIC) mouth spray 1 spray, 1 spray, Mouth/Throat, PRN, Amin, Ankit Chirag, MD, 1 spray at 02/25/18 2156 .  senna-docusate (Senokot-S) tablet 1 tablet, 1 tablet, Oral, QHS PRN, Annita Brod, MD  Patients Current Diet:     Diet Order                  Diet Carb Modified Fluid consistency: Thin; Room service appropriate? Yes  Diet effective now               Precautions / Restrictions Precautions Precautions: Fall Restrictions Weight Bearing Restrictions: No   Has the patient had 2 or more falls or a fall with injury in the past year?No  Prior Activity Level Limited Community (1-2x/wk): Independent without AD  Home Assistive Devices / [place] Devices/Equipment: None Home Equipment: Walker - 2 wheels, Cane - single point  Prior Device Use: Indicate devices/aids used by the patient prior to current illness, exacerbation or injury? None of the above  Prior Functional Level Prior Function Level of Independence: Independent  Self Care: Did the patient need help bathing, dressing, using the toilet or eating?  Independent  Indoor Mobility: Did the patient need assistance with walking from room to room (with or without device)? Independent  Stairs: Did the patient need assistance with internal or external stairs (with or without  device)? Independent  Functional Cognition: Did the patient need help planning regular tasks such as shopping or  remembering to take medications? Independent  Current Functional Level Cognition  Overall Cognitive Status: Impaired/Different from baseline Current Attention Level: Alternating Orientation Level: Oriented X4 General Comments: decreased awareness/ attention to left side but can attend with cues. Attention: Focused, Sustained Focused Attention: Appears intact Sustained Attention: Appears intact Memory: Appears intact Awareness: Appears intact Problem Solving: Appears intact Executive Function: Reasoning Reasoning: Appears intact Safety/Judgment: Appears intact    Extremity Assessment (includes Sensation/Coordination)  Upper Extremity Assessment: LUE deficits/detail LUE Deficits / Details: Noted LUE weakness gross motions 3+/proximal; < 3/5 distally with no active digit extension at this time LUE Sensation: decreased light touch, decreased proprioception LUE Coordination: decreased fine motor, decreased gross motor  Lower Extremity Assessment: Defer to PT evaluation LLE Deficits / Details: good strength upon assessment, but poor functional awareness during task performance/ambulation LLE Sensation: decreased proprioception LLE Coordination: decreased fine motor, decreased gross motor    ADLs  Overall ADL's : Needs assistance/impaired Eating/Feeding: Minimal assistance, Sitting Eating/Feeding Details (indicate cue type and reason): assist to set up tray Grooming: Wash/dry hands, Standing, Minimal assistance Grooming Details (indicate cue type and reason): pt needing assist to bring L UE into sink Upper Body Bathing: Moderate assistance, Sitting Lower Body Bathing: Moderate assistance, Sit to/from stand Upper Body Dressing : Moderate assistance, Sitting Lower Body Dressing: Moderate assistance Toilet Transfer: Minimal assistance, Ambulation Toileting- Clothing  Manipulation and Hygiene: Moderate assistance, Sit to/from stand Functional mobility during ADLs: Minimal assistance General ADL Comments: hand held assist to ambulate in room    Mobility  Overal bed mobility: Needs Assistance Bed Mobility: Supine to Sit Supine to sit: Min assist General bed mobility comments: Sitting in [facility] upon PT arrival.     Transfers  Overall transfer level: Needs assistance Equipment used: None Transfers: Sit to/from Stand, Stand Pivot Transfers Sit to Stand: Min assist Stand pivot transfers: Min assist General transfer comment: Cues to use RUE to push through LUE/LE upon standing with cues for forward translation. Same way to descend slowly with WB through [place]. Performed x4 from chair. SPT BSC to chair with Min A.     Ambulation / Gait / Stairs / Wheelchair Mobility  Ambulation/Gait Ambulation/Gait assistance: Herbalist (Feet): 60 Feet(x2 bouts) Assistive device: None(rail in hallway) Gait Pattern/deviations: Step-to pattern, Decreased stride length, Decreased weight shift to left, Step-through pattern, Narrow base of support, Decreased step length - left General Gait Details: Very slow, unsteady gait with decrease foot clearance LLE with cues to increase step length and cadence. Left knee instability noted when fatigued. Holding onto rail for support. 1 seated rest break. Poor awareness of LUE. Gait velocity: decreased    Posture / Balance Dynamic Sitting Balance Sitting balance - Comments: difficulty maintaining midline, posterior lean with any dynamic movement, increased time and cues to correct, fatigues. Balance Overall balance assessment: Needs assistance Sitting-balance support: Feet supported, No upper extremity supported Sitting balance-Leahy Scale: Poor Sitting balance - Comments: difficulty maintaining midline, posterior lean with any dynamic movement, increased time and cues to correct, fatigues. Postural control:  Posterior lean Standing balance support: During functional activity, Single extremity supported Standing balance-Leahy Scale: Poor Standing balance comment: reliance on UE support in standing, decreased attention to left side    Special needs/care consideration BiPAP/CPAP  N/a CPM n/a Continuous Drip IV n/a Dialysis  N/a Life Vest n/a Oxygen  N/a Special Bed n/a Trach Size n/a Wound Vac n/a Skin tear left arm Bowel mgmt: continent LBM 8/20 * Bladder mgmt: external catheter Diabetic mgmt Hgb  A1c 7.3   Previous Home Environment Living Arrangements: Spouse/significant other, Children  Lives With: Spouse, Daughter Available Help at Discharge: Family, Available 24 hours/day Type of Home: House Home Layout: One level Home Access: Stairs to enter Technical brewer of Steps: 2 Bathroom Shower/Tub: Multimedia programmer: Handicapped height Bathroom Accessibility: Yes How Accessible: Accessible via walker [place]: No  Discharge Living Setting Plans for Discharge Living Setting: Patient's home, Lives with (comment)(spouse and daughter) Type of Home at Discharge: House Discharge Home Layout: One level Discharge Home Access: Stairs to enter Entrance Stairs-Rails: None Entrance Stairs-Number of Steps: 2 Discharge Bathroom Shower/Tub: Walk-in shower Discharge Bathroom Toilet: Handicapped height Discharge Bathroom Accessibility: Yes How Accessible: Accessible via walker Does the patient have any problems obtaining your medications?: No  Social/Family/Support Systems Patient Roles: Spouse, Parent Contact Information: daughter, [name] Anticipated Caregiver: spouse and daughter Anticipated Caregiver's Contact Information: see above Ability/Limitations of Caregiver: none Caregiver Availability: 24/7 Discharge Plan Discussed with Primary Caregiver: Yes Is Caregiver In Agreement with Plan?: Yes Does Caregiver/Family have Issues with Lodging/Transportation  while Pt is in Rehab?: No   Son in Sports coach is cardiologist for [employer]  Goals/Additional Needs Patient/Family Goal for Rehab: supervision PT and OT Expected length of stay: ELOS 10 to 14 days Pt/Family Agrees to Admission and willing to participate: Yes Program Orientation Provided & Reviewed with Pt/Caregiver Including Roles  & Responsibilities: Yes  Decrease burden of Care through IP rehab admission: n/a  Possible need for SNF placement upon discharge: not anticipated  Patient Condition: This patient's condition remains as documented in the consult dated 02/25/2018, in which the Rehabilitation Physician determined and documented that the patient's condition is appropriate for intensive rehabilitative care in an inpatient rehabilitation facility. Will admit to inpatient rehab today.  Preadmission Screen Completed By:  Cleatrice Burke, 02/27/2018 8:25 AM ______________________________________________________________________   Discussed status with Dr. Naaman Plummer on 02/27/2018 at  [place] and received telephone approval for admission today.  Admission Coordinator:  Cleatrice Burke, time 4037 Date 02/27/2018           Cosigned by: Meredith Staggers, MD at 02/27/2018 8:38 AM  Revision History

## 2018-02-27 NOTE — Plan of Care (Signed)
Patient is making progress toward discharge, activity with moderate assist, patient is able to do 50% of activity with moderate assist. Will continue to monitor

## 2018-02-27 NOTE — Progress Notes (Signed)
Physical Therapy Treatment Patient Details Name: [name] MRN: [id number] DOB: February 01, 1946 Today's Date: 02/27/2018    History of Present Illness 72 y.o. female who presented to the ED from her PCP's office with LUE weakness and elevated BP. 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     PT Comments    Patient progressing well towards PT goals. Continues to demonstrate left inattention and weakness. Requires multiple cues and repetition to attend to left side and perform multi step commands. Easily distracted. Tolerated gait training with Min A today and Mod A for standing transfers. Worked on dynamic trunk control and activation. Excited for CIR. Will follow.   Follow Up Recommendations  CIR     Equipment Recommendations  None recommended by PT    Recommendations for Other Services       Precautions / Restrictions Precautions Precautions: Fall Restrictions Weight Bearing Restrictions: No    Mobility  Bed Mobility               General bed mobility comments: up in chair upon PT arrival.   Transfers Overall transfer level: Needs assistance Equipment used: None Transfers: Sit to/from Stand Sit to Stand: Mod assist         General transfer comment: Cues to use RUE to push through LUE/LE upon standing with cues for forward translation. Mod A to rise x5.. Same way to descend slowly with WB through [place]. Stood from toilet x1 using rail  Ambulation/Gait Ambulation/Gait assistance: Herbalist (Feet): 50 Feet(x3 bouts) Assistive device: None Gait Pattern/deviations: Step-to pattern;Decreased stride length;Decreased weight shift to left;Step-through pattern;Narrow base of support;Decreased step length - left Gait velocity: decreased   General Gait Details: Very slow, unsteady gait with leaning left, decrease  foot clearance LLE esp when fatigued. Left knee instability noted but no buckling. HHA for support. 1 standing rest break, 2 seated rest breaks. Poor awareness of LUE.   Stairs             Wheelchair Mobility    Modified Rankin (Stroke Patients Only) Modified Rankin (Stroke Patients Only) Pre-Morbid Rankin Score: Slight disability Modified Rankin: Moderately severe disability     Balance Overall balance assessment: Needs assistance Sitting-balance support: Feet supported;No upper extremity supported Sitting balance-Leahy Scale: Fair Sitting balance - Comments: Working on activating spinal extensors sitting in chair with no support requiring min manual cues for midline and upright posture. Fatigues quickly but able to initiate with only verbal cues on second attempt. Practiced dynamic sitting for ~30 sec, 40 sec, 20 sec.  Postural control: Posterior lean Standing balance support: During functional activity;Single extremity supported Standing balance-Leahy Scale: Poor Standing balance comment: reliance on UE support in standing, decreased attention to left side. Leans left.                             Cognition Arousal/Alertness: Awake/alert Behavior During Therapy: WFL for tasks assessed/performed Overall Cognitive Status: Impaired/Different from baseline Area of Impairment: Attention;Problem solving                   Current Attention Level: Alternating         Problem Solving: Requires verbal cues;Requires tactile cues;Slow processing General Comments: decreased awareness/ attention to left side but can attend with cues.      Exercises  General Comments General comments (skin integrity, edema, etc.): Daughter present during session.      Pertinent Vitals/Pain Pain Assessment: No/denies pain    Home Living                      Prior Function            PT Goals (current goals can now be found in the care plan section)  Progress towards PT goals: Progressing toward goals    Frequency    Min 4X/week      PT Plan Current plan remains appropriate    Co-evaluation              AM-PAC PT "6 Clicks" Daily Activity  Outcome Measure  Difficulty turning over in bed (including adjusting bedclothes, sheets and blankets)?: A Little Difficulty moving from lying on back to sitting on the side of the bed? : Unable Difficulty sitting down on and standing up from a chair with arms (e.g., wheelchair, bedside commode, etc,.)?: Unable Help needed moving to and from a bed to chair (including a wheelchair)?: A Little Help needed walking in hospital room?: A Little Help needed climbing 3-5 steps with a railing? : A Lot 6 Click Score: 13    End of Session Equipment Utilized During Treatment: Gait belt Activity Tolerance: Patient tolerated treatment well Patient left: in chair;with call bell/phone within reach;with family/visitor present Nurse Communication: Mobility status PT Visit Diagnosis: Difficulty in walking, not elsewhere classified (R26.2);Other symptoms and signs involving the nervous system (R29.898);Hemiplegia and hemiparesis Hemiplegia - Right/Left: Left Hemiplegia - dominant/non-dominant: Non-dominant Hemiplegia - caused by: Cerebral infarction     Time: 1001-1037 PT Time Calculation (min) (ACUTE ONLY): 36 min  Charges:  $Gait Training: 8-22 mins $Therapeutic Activity: 8-22 mins                     [place], [place], [place] [phone]     Marguarite Arbour A Vicent Febles 02/27/2018, 11:22 AM

## 2018-02-27 NOTE — Progress Notes (Signed)
Transfer Note:   Traveling Method: bed Transferring Unit: 5W Mental Orientation: A&Ox4 Telemetry: Per MD orders Assessment: Completed Skin: Warm, dry and intact. IV: Clean, dry and intact. Pain: Stated Safety Measures: Safety Fall Prevention Plan has been given, discussed and signed Admission: Completed Family: daughter @ bedside Transferring Incident:: Inpatient rehab  Orders have been reviewed and implemented. Will continue to monitor the patient. Call light has been placed within reach and bed alarm has been activated.   Chapman Fitch BSN, RN [employer] [facility] Phone number: [employer]

## 2018-02-27 NOTE — Progress Notes (Signed)
Admit to unit, oriented to unit, plan of care, rehab routine and medications. States an understanding of information reviewed. Daughter at bedside given information as well.  Margarito Liner

## 2018-02-27 NOTE — H&P (Signed)
Physical Medicine and Rehabilitation Admission H&P     Chief Complaint  Patient presents with  . Stroke with functional deficits.   HPI: [name] is a44 year old RH female with history of HTN, CKD--baseline SCr- 1.6, T2DM. Per chart review patient lives with spouse and daughter. Reportedly independent prior to admission. Son is a cardiologist in [place]. Admitted on 02/24/18 with weakness, LUE weakness with heaviness and elevated BP. SBP>200. MRI/MRA brain done revealing clustered foci of acute/early subacute infarct within right frontoparietal junction with additional punctate infarct right insula and short segment right M2 division severe stenosis/near occlusion and tandem stenosis right A3. Carotid dopplers without evidence of significant ICA stenosis. 2D echo revealed EF 60-65% with no wall abnormality, severely calcified MV and grade 2 diastolic dysfunction. Dr. Leonie Man felt that stroke was embolic cue to unknown source --A fib suspected but patient declined TEE and loop recorder. To continue ASA/Plavix for 3 weeks followed by ASA alone. Physical and occupational therapy evaluations completed with recommendations of physical medicine rehab consult. Patient was admitted for a comprehensive rehab program.  Review of Systems  Constitutional: Negative for chills and fever.  HENT: Negative for hearing loss and tinnitus.  Eyes: Negative for blurred vision and double vision.  Vision stable and improved in the past year.  Respiratory: Negative for cough and shortness of breath.  Cardiovascular: Negative for chest pain and palpitations.  Gastrointestinal: Negative for abdominal pain, constipation, heartburn and nausea.  Genitourinary: Positive for frequency (past month) and urgency (for the past month.). Negative for dysuria.  Musculoskeletal: Positive for falls (last earlier this year). Negative for back pain, joint pain and myalgias.  Skin: Negative for rash.  Neurological: Positive for  sensory change, speech change, focal weakness and weakness (BLE weakness --has to rest frequently). Negative for dizziness and headaches.  Psychiatric/Behavioral: The patient does not have insomnia.       Past Medical History:  Diagnosis Date  . Allergy   . Arthritis   . Cataract   . Diabetes mellitus without complication ([place])   . Macular degeneration syndrome    with edema---being treated.         Past Surgical History:  Procedure Laterality Date  . callous removal  Left 2017   located on 1st digit on L foot 2/2 diabetes        Family History  Problem Relation Age of Onset  . Kidney disease Mother   . Congestive Heart Failure Father   . Diabetes Maternal Aunt    Social History: Married. Retired from [employer] as Scientist, physiological at [employer] Independent but sedentary. She reports that she has never smoked. She has never used smokeless tobacco. She reports that she does not drink alcohol or use drugs.       Allergies  Allergen Reactions  . Clonidine Anaphylaxis, Swelling and Other (See Comments)    Tongue swelling  . Codeine Itching and Rash         Medications Prior to Admission  Medication Sig Dispense Refill  . Aflibercept (EYLEA) 2 MG/0.05ML SOLN 1 each by Intravitreal route every 3 (three) months.     [place] EX Take 1 tablet by mouth daily as needed (For pain.).     Marland Kitchen diclofenac sodium (VOLTAREN) 1 % GEL Apply 2 g topically 4 (four) times daily. (Patient taking differently: Apply 2 g topically 4 (four) times daily as needed (For pain.). ) 100 g 0  . fluticasone (FLONASE) 50 MCG/ACT nasal spray Place 2 sprays into  both nostrils daily.     Marland Kitchen glucose blood (PRECISION QID TEST) test strip 1 each by Other route as needed (For blood sugar.).     Marland Kitchen lisinopril (PRINIVIL,ZESTRIL) 5 MG tablet Take 1/2 tablet daily (Patient taking differently: Take 2.5 mg by mouth daily. ) 90 tablet 0  . loratadine (CLARITIN) 10 MG tablet Take 10 mg by mouth daily.     . metFORMIN  (GLUCOPHAGE) 500 MG tablet Take 500 mg by mouth 2 (two) times daily with a meal.     . TURMERIC PO Take 1 tablet by mouth daily.     Drug Regimen Review  Drug regimen was reviewed and remains appropriate with no significant issues identified  Home:  Home Living  Family/patient expects to be discharged to:: Private residence  Living Arrangements: Spouse/significant other, Children  Available Help at Discharge: Family  Type of Home: House  Home Access: Stairs to enter  Technical brewer of Steps: 2  Home Layout: One level  Bathroom Shower/Tub: Tourist information centre manager: Handicapped height  Home Equipment: Environmental consultant - 2 wheels, Radio producer - single point  Lives With: Spouse, Daughter  Functional History:  Prior Function  Level of Independence: Independent  Functional Status:  Mobility:  Bed Mobility  Overal bed mobility: Needs Assistance  Bed Mobility: Supine to Sit  Supine to sit: Min assist  General bed mobility comments: Sitting in [facility] upon PT arrival.  Transfers  Overall transfer level: Needs assistance  Equipment used: None  Transfers: Sit to/from Stand, [employer] Transfers  Sit to Stand: Min assist  Stand pivot transfers: Min assist  General transfer comment: Cues to use RUE to push through LUE/LE upon standing with cues for forward translation. Same way to descend slowly with WB through [place]. Performed x4 from chair. SPT BSC to chair with Min A.  Ambulation/Gait  Ambulation/Gait assistance: Min assist  Gait Distance (Feet): 60 Feet(x2 bouts)  Assistive device: None(rail in hallway)  Gait Pattern/deviations: Step-to pattern, Decreased stride length, Decreased weight shift to left, Step-through pattern, Narrow base of support, Decreased step length - left  General Gait Details: Very slow, unsteady gait with decrease foot clearance LLE with cues to increase step length and cadence. Left knee instability noted when fatigued. Holding onto rail for support. 1 seated rest  break. Poor awareness of LUE.  Gait velocity: decreased   ADL:  ADL  Overall ADL's : Needs assistance/impaired  Eating/Feeding: Minimal assistance, Sitting  Eating/Feeding Details (indicate cue type and reason): assist to set up tray  Grooming: Wash/dry hands, Standing, Minimal assistance  Grooming Details (indicate cue type and reason): pt needing assist to bring L UE into sink  Upper Body Bathing: Moderate assistance, Sitting  Lower Body Bathing: Moderate assistance, Sit to/from stand  Upper Body Dressing : Moderate assistance, Sitting  Lower Body Dressing: Moderate assistance  Toilet Transfer: Minimal assistance, Ambulation  Toileting- Clothing Manipulation and Hygiene: Moderate assistance, Sit to/from stand  Functional mobility during ADLs: Minimal assistance  General ADL Comments: hand held assist to ambulate in room  Cognition:  Cognition  Overall Cognitive Status: Impaired/Different from baseline  Orientation Level: Oriented X4  Attention: Focused, Sustained  Focused Attention: Appears intact  Sustained Attention: Appears intact  Memory: Appears intact  Awareness: Appears intact  Problem Solving: Appears intact  Executive Function: Reasoning  Reasoning: Appears intact  Safety/Judgment: Appears intact  Cognition  Arousal/Alertness: Awake/alert  Behavior During Therapy: WFL for tasks assessed/performed  Overall Cognitive Status: Impaired/Different from baseline  Area of Impairment:  Attention, Problem solving  Current Attention Level: Alternating  Problem Solving: Requires verbal cues, Requires tactile cues  General Comments: decreased awareness/ attention to left side but can attend with cues.   Physical Exam:  Blood pressure (!) 165/60, pulse 91, temperature 98.3 F (37.4 C), temperature source Oral, resp. rate 18, height 5' 3.25" (1.607 m), weight 71.3 kg, SpO2 96 %.  Physical Exam  Nursing note and vitals reviewed.  Constitutional: She is oriented to person,  place, and time. She appears well-developed and well-nourished.  Neck: No tracheal deviation present. No thyromegaly present.  Cardiovascular: Normal rate, regular rhythm and normal heart sounds. Exam reveals no friction rub.  No murmur heard.  Respiratory: Effort normal and breath sounds normal. No respiratory distress. She has no wheezes. She has no rales.  GI: Soft. She exhibits no distension. There is no tenderness.  Musculoskeletal:  Trace edema LUE/LLE. Petechial areas LUE.  Neurological: She is alert and oriented to person, place, and time.  Pt sitting up in chair. Reasonable insight and awareness. Functional memory. Normal language. Left central 7, speech minimally dysarthric. LUE 3-/5 prox to distal with decreased [place]. LLE 4/5 prox to distal with some impairment of [place]. Senses pain and LT LLE>LUE. RUE/RLE motor and sensory normal. .  Skin: Skin is warm.  Psychiatric: She has a normal mood and affect. Her behavior is normal.    Lab Results Last 48 Hours  Imaging Results (Last 48 hours)     Medical Problem List and  Plan:  1. Left-sided weakness secondary to right superior frontal scattered foci and right insular foci infarcts felt to be embolic  -admit to inpatient rehab  2. DVT Prophylaxis/Anticoagulation: SCDs  3. Pain Management: N/A  4. Mood: LCSW to follow for evaluation and support.  5. Neuropsych: This patient is capable of making decisions on his own behalf.  6. Skin/Wound Care: routine pressure relief measures.  7. Fluids/Electrolytes/Nutrition: Maintain adequate nutritional and hydration status.  8. HTN: Difficult to control PTA. Permissive HTN with gradual normalization--continue low dose lisinopril and titrate upwards as indicated  9. T2DM: Hgb A1C- 7.3. Check blood sugars before meals and at bedtime.  - Patient had been on Glucophage 500 mg twice daily prior to admission  10. CKD: Baseline SCr 1.6. Encourage fluid intake. Monitor renal status with serial checks for stability.   Post Admission Physician Evaluation: 1. Functional deficits secondary  to Right fronto-parietal, insular infarcts. 2. Patient is admitted to receive collaborative, interdisciplinary care between the physiatrist, rehab nursing staff, and therapy team. 3. Patient's level of medical complexity and substantial therapy needs in context of that medical necessity cannot be provided at a lesser intensity of care such as a SNF. 4. Patient has experienced substantial functional loss from his/her baseline which was documented above under the "Functional History" and "Functional Status" headings.  Judging by the patient's diagnosis, physical exam, and functional history, the patient has potential for functional progress which will result in measurable gains while on inpatient rehab.  These gains will be of substantial and practical use upon discharge  in facilitating mobility and self-care at the household level. 5. Physiatrist will provide 24 hour management of medical needs as well as oversight of the therapy plan/treatment and  provide guidance as appropriate regarding the interaction of the two. 6. The Preadmission Screening has been reviewed and patient status is unchanged unless otherwise stated above. 7. 24 hour rehab nursing will assist with bladder management, bowel management, safety, skin/wound care, disease management, medication administration, pain management and patient education  and help integrate therapy concepts, techniques,education, etc. 8. PT will assess and treat for/with: Lower extremity strength, range of motion, stamina, balance, functional mobility, safety, adaptive techniques and equipment, NMR, community reentry, family ed.   Goals are: supervision to mod I . 9. OT will assess and treat for/with: ADL's, functional mobility, safety, upper extremity strength, adaptive techniques and equipment, NMR, family ed, community reentry, ego support.   Goals are: supervision to mod I. Therapy may proceed with showering this patient. 10. SLP will assess and treat for/with: n/a.  Goals are: n/a. 11. Case Management and Social Worker will assess and treat for psychological issues and discharge planning. 12. Team conference will be held weekly to assess progress toward goals and to determine barriers to discharge. 13. Patient will receive at least 3 hours of therapy per day at least 5 days per week. 14. ELOS: 10-13 days       15. Prognosis:  excellent   I have personally performed a face to face diagnostic evaluation of this patient and formulated the key components of the plan.  Additionally, I have personally reviewed laboratory data, imaging studies, as well as relevant notes and concur with the physician assistant's documentation above.  Meredith Staggers, MD, [facility]     Bary Leriche, PA-C  02/26/2018

## 2018-02-27 NOTE — Progress Notes (Signed)
Pt admitted to 4W24. Oriented to floor and call bell. No complaint of pain or discomforted voiced. Pt does not seem to be in any distress. Daughter is at bedside. Continue plan of care.  [name] W [name]

## 2018-02-27 NOTE — Progress Notes (Signed)
Patient discharging to CIR.   CSW signing off. Please re-consult if needed.   Percell Locus Flannery Cavallero LCSW [phone]

## 2018-02-27 NOTE — Progress Notes (Signed)
PROGRESS NOTE    [name]  YHC:[id number] DOB: 01-16-1946 DOA: 02/24/2018 PCP: Lucille Passy, MD   Brief Narrative:  72 year old with history of diabetes mellitus type 2, essential hypertension, CKD stage II-3 was sent to the hospital from primary care physician's office for left upper extremity weakness and elevated blood pressure. Patient's systolic blood pressure was greater than 200 and the primary care physician's office. Patient underwent evaluation in the ER here and MRI showed acute/early subacute infarction in the right superior frontoparietal junction. Neurology was consulted and she was admitted for routine stroke work-up. Carotid Dopplers were noted to be relatively unremarkable.  Echocardiogram showed 60% ejection fraction with grade 2 diastolic dysfunction.  Hemoglobin A1c and lipid panel is still pending at the time of discharge. She was evaluated physical therapy who recommended she would benefit from inpatient rehabilitation.  Advised that she takes aspirin and Plavix for 3 weeks followed by aspirin alone indefinitely.  Needs to follow-up outpatient with neurology in 4 weeks. At this point she is reached maximum benefit from hospital stay and stable to be discharged from inpatient stay to [facility] with outpatient recommendations as stated above.   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  - MRI of the brain shows acute infarct in the frontoparietal junction. There is an to severe stenosis seen on the right, right 18 moderate to severe stenosis. Carotid  Dopplers shows 1-39% stenosis bilaterally echocardiogram 60% with G2DD - TEE has been ordered by neurology for tomorrow;before this she wants to speak with her son-in-law who is a cardiologist. -Lipid panel-141 and hemoglobin A1c -7.4. Atorvastatin added.  -Currently undergoing CIR evaluation. -Plan for aspirin and Plavix for 3 weeks followed by aspirin alone. -Spoke with neurology, Dr Leonie Man prior to her discharge.  Hypertensive emergency; improved  Essential Hypertension - Currently blood pressure systolic is 517 range. Continue to allow this and slowly normalized over next a week. Currently is on lisinopril 2.5 mg daily. Need to slowly either increase Lisinopril or add Possiblly norvasc over next 7 days with Target SBP ~120.   Diabetes mellitus type 2 -resume home meds   CKD stage III -Currently this is stable.   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 Follow-up outpatient.  DVT prophylaxis:SCDs Code Status:Full Code Family Communication:Daughter at Bedside Disposition Plan:CIR today  Subjective: No complaints, feels little stronger.   Review of Systems Otherwise negative except as per HPI, including: General: Denies fever, chills, night sweats or unintended weight loss. Resp: Denies cough, wheezing, shortness of breath. Cardiac: Denies chest pain, palpitations, orthopnea, paroxysmal nocturnal dyspnea. GI: Denies abdominal pain, nausea, vomiting, diarrhea or constipation GU: Denies dysuria, frequency, hesitancy or incontinence MS: Denies muscle aches, joint pain or swelling Neuro: Denies headache, neurologic deficits (focal weakness, numbness, tingling), abnormal gait Psych: Denies anxiety, depression, SI/HI/AVH Skin: Denies new rashes or lesions ID: Denies sick contacts, exotic exposures, travel  Objective: Vitals:   02/26/18 2330 02/27/18 0400 02/27/18 0606 02/27/18 0753  BP: (!) 177/76 (!) 136/49 (!) 164/62 (!) 174/63  Pulse:   78 77  Resp:   18 18  Temp:   98.6 F (37 C) 98.4 F (36.9 C)  TempSrc:   Oral Oral  SpO2:   95% 97%  Weight:      Height:       No intake or output data in the 24 hours ending 02/27/18 1041 Filed Weights   02/24/18 1252  Weight: 71.3 kg    Examination:  General exam: Appears calm and comfortable  Respiratory system: Clear to auscultation. Respiratory effort normal. Cardiovascular system: S1 & S2 heard, RRR. No JVD, murmurs, rubs, gallops or clicks. No pedal edema. Gastrointestinal system: Abdomen is nondistended, soft and nontender. No organomegaly or masses felt. Normal bowel sounds heard. Central nervous system: Alert and oriented. No focal neurological deficits. Extremities: LUE 3/5, RUE RLE 4/5 Skin: No rashes, lesions or ulcers Psychiatry: Judgement and insight appear normal. Mood & affect appropriate.     Data Reviewed:   CBC: Recent Labs  Lab 02/24/18 1330  WBC 8.4  NEUTROABS 5.7  HGB 12.6  HCT 38.4  MCV 83.7  PLT 638   Basic Metabolic Panel: Recent Labs  Lab 02/24/18 1330  NA 144  K 4.0  CL 107  CO2 27  GLUCOSE 167*  BUN 31*  CREATININE 1.68*  CALCIUM 10.1  MG 2.2  PHOS 3.6   GFR: Estimated Creatinine Clearance: 28.8 mL/min (A) (by C-G formula based on SCr of 1.68 mg/dL (H)). Liver Function Tests: No results for input(s): AST, ALT, ALKPHOS, BILITOT, PROT, ALBUMIN in the last 168 hours. No results for input(s): LIPASE, AMYLASE in the last 168 hours. No results for input(s): AMMONIA in the last 168 hours. Coagulation Profile: Recent Labs  Lab 02/24/18 1330  INR 0.96   Cardiac Enzymes: No results for input(s): CKTOTAL, CKMB, CKMBINDEX, TROPONINI in the last 168 hours. BNP (last 3 results) No results for input(s): PROBNP in the last 8760 hours. HbA1C: Recent Labs    02/26/18 1649  HGBA1C 7.4*   CBG: Recent Labs  Lab 02/26/18 0831 02/26/18 1208 02/26/18 1708 02/26/18 2217 02/27/18 0751  GLUCAP 175* 170* 179* 146* 153*   Lipid Profile: Recent  Labs    02/26/18 1649  CHOL 245*  HDL 42  LDLCALC 141*  TRIG 308*  CHOLHDL 5.8   Thyroid Function Tests: Recent Labs    02/24/18 1330  TSH 4.082   Anemia Panel: No results for input(s): VITAMINB12, FOLATE, FERRITIN, TIBC, IRON, RETICCTPCT in the last 72 hours. Sepsis Labs: No results for input(s): PROCALCITON, LATICACIDVEN in the last 168 hours.  No results found for this or any previous visit (from the past 240 hour(s)).       Radiology Studies: No results found.      Scheduled Meds: . aspirin EC  81 mg Oral Daily  . clopidogrel  75 mg Oral Daily  . fexofenadine  60 mg Oral BID  . fluticasone  2 spray Each Nare Daily  . insulin aspart  0-5 Units Subcutaneous QHS  . insulin aspart  0-9 Units Subcutaneous TID WC  . lisinopril  2.5 mg Oral Daily   Continuous Infusions: . sodium chloride 10 mL/hr at 02/25/18 0028     LOS: 2 days    I have spent 20 minutes face to face with the patient and on the ward discussing the patients care, assessment, plan and disposition with other care givers. >50% of the time was devoted counseling the patient about the risks and benefits of treatment and coordinating care.     Ankit Arsenio Loader, MD [facility] Pager  [phone]   If 7PM-7AM, please contact night-coverage [email or web address] Password [facility] 02/27/2018, 10:41 AM

## 2018-02-28 ENCOUNTER — Inpatient Hospital Stay (HOSPITAL_COMMUNITY): Payer: Medicare Other | Admitting: Occupational Therapy

## 2018-02-28 ENCOUNTER — Inpatient Hospital Stay (HOSPITAL_COMMUNITY): Payer: Medicare Other

## 2018-02-28 DIAGNOSIS — N183 Chronic kidney disease, stage 3 unspecified: Secondary | ICD-10-CM

## 2018-02-28 DIAGNOSIS — I169 Hypertensive crisis, unspecified: Secondary | ICD-10-CM

## 2018-02-28 MED ORDER — HYDRALAZINE HCL 10 MG PO TABS
10.0000 mg | ORAL_TABLET | Freq: Four times a day (QID) | ORAL | Status: DC | PRN
  Administered 2018-02-28 – 2018-03-01 (×2): 10 mg via ORAL
  Filled 2018-02-28 (×2): qty 1

## 2018-02-28 NOTE — Evaluation (Signed)
Physical Therapy Assessment and Plan  Patient Details  Name: [name] MRN: [id number] Date of Birth: 21-Jan-1946  PT Diagnosis: Abnormality of gait, Cognitive deficits, Hemiparesis non-dominant, Hypertonia, Impaired sensation and Osteoarthritis Rehab Potential: Good ELOS: 12-14   Today's Date: 02/28/2018 PT Individual Time: 7124-5809 PT Individual Time Calculation (min): 68 min    Problem List:  Patient Active Problem List   Diagnosis Date Noted  . Chronic kidney disease (CKD), stage III (moderate) (HCC)   . Hypertensive crisis   . Embolic stroke ([place]) 98/33/8250  . Left hemiparesis ([place])   . Diabetes mellitus type 2 in nonobese (HCC)   . Acute ischemic stroke ([place])   . Left arm weakness   . Hypertensive urgency 02/24/2018  . Acute CVA (cerebrovascular accident) ([place]) 02/24/2018  . 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 Weakness of left upper extremity 02/24/2018  . Overweight (BMI 25.0-29.9) 02/24/2018  . CKD (chronic kidney disease), stage III ([place]) 02/24/2018  . Diabetes mellitus without complication ([place]) 53/97/6734  . HTN (hypertension) 09/02/2017  . Fall at home, initial encounter 09/02/2017  . Knee pain, bilateral 09/02/2017  . Weakness 09/02/2017    Past Medical History:  Past Medical History:  Diagnosis Date  . Allergy   . Arthritis   . Cataract   . Diabetes mellitus without complication ([place])   . Hypertension   . Macular degeneration syndrome    with edema---being treated.    Past Surgical History:  Past Surgical History:  Procedure Laterality Date  . callous removal  Left 2017   located on 1st digit on L foot 2/2 diabetes  . EYE SURGERY      Assessment & Plan Clinical Impression:[name] is a66 year old RH female with history of HTN, CKD--baseline SCr- 1.6, T2DM. Per chart review patient lives with spouse and daughter. Reportedly independent prior to admission. Son is a cardiologist in [place]. Admitted on 02/24/18  with weakness, LUE weakness with heaviness and elevated BP. SBP>200. MRI/MRA brain done revealing clustered foci of acute/early subacute infarct within right frontoparietal junction with additional punctate infarct right insula and short segment right M2 division severe stenosis/near occlusion and tandem stenosis right A3. Carotid dopplers without evidence of significant ICA stenosis. 2D echo revealed EF 60-65% with no wall abnormality, severely calcified MV and grade 2 diastolic dysfunction. Dr. Leonie Man felt that stroke was embolic cue to unknown source --A fib suspected but patient declined TEE and loop recorder. To continue ASA/Plavix for 3 weeks followed by ASA alone.  Patient transferred to CIR on 02/27/2018 .   Patient currently requires max with mobility secondary to muscle weakness and muscle joint tightness, decreased cardiorespiratoy endurance, impaired timing and sequencing, abnormal tone, decreased coordination and decreased motor planning, decreased attention, decreased awareness, decreased problem solving, decreased safety awareness and decreased memory and decreased sitting balance, decreased standing balance, decreased postural control, hemiplegia and decreased balance strategies.  Prior to hospitalization, patient was independent  with mobility and lived with Spouse, Daughter in a House home.  Home access is 2Stairs to enter.  Patient will benefit from skilled PT intervention to maximize safe functional mobility, minimize fall risk and decrease caregiver burden for planned discharge home with 24 hour supervision.  Anticipate patient will benefit from follow up [place] at discharge.  PT - End of Session Activity Tolerance: Tolerates 10 - 20 min activity with multiple rests Endurance Deficit: Yes Endurance Deficit Description: fatigued after propelling w/c x 80' PT Assessment Rehab Potential (ACUTE/IP ONLY):  Good PT Patient demonstrates impairments in the following area(s):  Balance;Endurance;Motor;Safety;Sensory PT Transfers Functional Problem(s): Bed Mobility;Bed to Chair;Car;Furniture PT Locomotion Functional Problem(s): Ambulation;Wheelchair Mobility;Stairs PT Plan PT Intensity: Minimum of 1-2 x/day ,45 to 90 minutes PT Frequency: 5 out of 7 days PT Duration Estimated Length of Stay: 12-14 PT Treatment/Interventions: Ambulation/gait training;Community reintegration;DME/adaptive equipment instruction;Neuromuscular re-education;Psychosocial support;Stair training;UE/LE Strength taining/ROM;Wheelchair propulsion/positioning;Balance/vestibular training;Discharge planning;Functional electrical stimulation;Pain management;Therapeutic Activities;UE/LE Coordination activities;Cognitive remediation/compensation;Functional mobility training;Patient/family education;Splinting/orthotics;Therapeutic Exercise;Visual/perceptual remediation/compensation PT Transfers Anticipated Outcome(s): supervision basic and furniture; min assist car PT Locomotion Anticipated Outcome(s): supervision gait x 150' with LRAD and up/down 12 steps 2 rails, and w/c propulsion x 150 wiht supervision PT Recommendation Recommendations for Other Services: Neuropsych consult(may be appropriate; pt reports memory problems PTA) Follow Up Recommendations: Home health PT Patient destination: Home Equipment Recommended: To be determined Equipment Details: pt has currenlty borrowed RW and [facility]  Skilled Therapeutic Intervention Pt was pleasant and cooperative during eval, but slightly impulsive.  She reported a fall without injury about a year ago, tripping over a threshold.  Pt remembered 3/3 words over 10 min, but reported that she has had problems remembering things, PTA.  She reported limited activity PTA, driving very little, and doing laundry only for household tasks.  PT assisted pt with BSC> w/c transfer, after she voided continently with [place], LPN, who had assisted her EOB> BSC. She demonstrated LUE  inattention during mobility.  In sitting EOB, pt drifted L several times with no awareness. In w/c, pt used L hand to grossly push bean bags on a table to her left, into a bin in her lap, using R hand to assist for 5/12 bean bags.  W/c propulsion using hemi technique in ultra hemi-ht w/c, over level tile x 50' with min assist for steering.  PT discussed falls precautions with pt and dtr [name]; they verbalized understanding and will use call bell.  PT also educated [name] about increasing pt's attention to L. Pt left resting in w/c with seat alarm set, and needs at hand.  Dtr [name] present.   PT Evaluation Precautions/Restrictions Precautions Precautions: Fall Restrictions Weight Bearing Restrictions: No General   Vital Signs Pain Pain Assessment Pain Score: 0-No pain Home Living/Prior Functioning Home Living Available Help at Discharge: Family Type of Home: House Home Access: Stairs to enter Technical brewer of Steps: 2 Entrance Stairs-Rails: Can reach both;Right;Left Home Layout: One level Bathroom Shower/Tub: Multimedia programmer: Handicapped height Bathroom Accessibility: Yes  Lives With: Spouse;Daughter Prior Function Level of Independence: Independent with basic ADLs(does laundry only)  Able to Take Stairs?: Yes Driving: Yes(in the neighborhood) Vocation: Retired Leisure: Hobbies-yes (Comment) Comments: reads and watches TV Vision/Perception - pt wears reading glasses; reports no changes with CVA    Cognition Overall Cognitive Status: Within Functional Limits for tasks assessed Orientation Level: Oriented X4 Attention: Focused;Sustained Focused Attention: Appears intact Sensation Sensation Light Touch: Impaired Detail Central sensation comments: decreased localization/awareness LLE Light Touch Impaired Details: Impaired LLE Proprioception: Appears Intact Coordination Gross Motor Movements are Fluid and Coordinated: No Fine Motor Movements are Fluid  and Coordinated: No Motor  Motor Motor: Abnormal tone Motor - Skilled Clinical Observations: mild spasticity LLE  Mobility Bed Mobility Bed Mobility: Rolling Right;Rolling Left;Left Sidelying to Sit Rolling Right: Minimal Assistance - Patient > 75% Rolling Left: Minimal Assistance - Patient > 75% Left Sidelying to Sit: Moderate Assistance - Patient 50-74% Transfers Transfers: Risk manager;Sit to Stand Sit to Stand: Moderate Assistance - Patient 50-74% Stand Pivot Transfers: Maximal Assistance -  Patient 25 - 49% Stand Pivot Transfer Details: Manual facilitation for weight shifting;Verbal cues for safe use of DME/AE;Manual facilitation for placement;Verbal cues for sequencing;Verbal cues for technique Transfer (Assistive device): None Locomotion  Gait Ambulation: Yes Gait Assistance: Moderate Assistance - Patient 50-74% Gait Distance (Feet): 50 Feet Assistive device: None Gait Gait: Yes Gait Pattern: Impaired Gait Pattern: Decreased hip/knee flexion - left;Decreased dorsiflexion - left;Decreased step length - right;Step-through pattern;Decreased step length - left;Narrow base of support;Decreased trunk rotation;Left flexed knee in stance(progresses with excessive L><R wt shifting) Gait velocity: decreased Stairs / Additional Locomotion Stairs: Yes Stairs Assistance: Maximal Assistance - Patient 25 - 49% Stair Management Technique: One rail Right Number of Stairs: 1 Height of Stairs: 6 Wheelchair Mobility Wheelchair Mobility: Yes Wheelchair Assistance: Maximal Assistance - Patient 25 - 49% Wheelchair Propulsion: Right upper extremity Wheelchair Parts Management: Needs assistance Distance: 20  Trunk/Postural Assessment  Cervical Assessment Cervical Assessment: Within Functional Limits Thoracic Assessment Thoracic Assessment: Within Functional Limits Lumbar Assessment Lumbar Assessment: Exceptions to WFL(increased L wt bearing in sitting) Postural Control Postural  Control: Deficits on evaluation Righting Reactions: no awareness of L lean in sitting Protective Responses: delayed and inadequate  Balance Balance Balance Assessed: Yes Static Sitting Balance Static Sitting - Level of Assistance: 4: Min assist Dynamic Sitting Balance Dynamic Sitting - Level of Assistance: Not tested (comment) Static Standing Balance Static Standing - Level of Assistance: 4: Min assist Dynamic Standing Balance Dynamic Standing - Level of Assistance: 3: Mod assist(for gait without AD) Extremity Assessment      RLE Assessment RLE Assessment: Within Functional Limits Passive Range of Motion (PROM) Comments: tight heel cords LLE Assessment LLE Assessment: Exceptions to [facility] Passive Range of Motion (PROM) Comments: tight heel cords General Strength Comments: grossly 4/5 hip and ankle; 4+/5 knee; all in sitting   See Function Navigator for Current Functional Status.   Refer to Care Plan for Long Term Goals  Recommendations for other services: Neuropsych- may be appropriate; pt reported memory problems PTA  Discharge Criteria: Patient will be discharged from PT if patient refuses treatment 3 consecutive times without medical reason, if treatment goals not met, if there is a change in medical status, if patient makes no progress towards goals or if patient is discharged from hospital.  The above assessment, treatment plan, treatment alternatives and goals were discussed and mutually agreed upon: by patient and by family  Bristol Soy 02/28/2018, 12:10 PM

## 2018-02-28 NOTE — Progress Notes (Signed)
[facility]     PROGRESS NOTE  Subjective/Complaints:  Patient seen lying in bed this morning. Family at bedside. She states she slept well overnight. His questions about blood pressure.  ROS: Denies CP, SOB, N/V/D  Objective: Vital Signs: Blood pressure (!) 172/57, pulse 80, temperature 98 F (36.7 C), temperature source Oral, resp. rate 18, weight 68.9 kg, SpO2 98 %. No results found. No results for input(s): WBC, HGB, HCT, PLT in the last 72 hours. No results for input(s): NA, K, CL, GLUCOSE, BUN, CREATININE, CALCIUM in the last 72 hours.  Invalid input(s): CO CBG (last 3)  Recent Labs    02/26/18 2217 02/27/18 0751 02/27/18 1148  GLUCAP 146* 153* 174*    Wt Readings from Last 3 Encounters:  02/27/18 68.9 kg  02/24/18 71.3 kg  02/24/18 71.4 kg    Physical Exam:  BP (!) 172/57 (BP Location: Left Arm)   Pulse 80   Temp 98 F (36.7 C) (Oral)   Resp 18   Wt 68.9 kg   SpO2 98%   BMI 26.70 kg/m  Constitutional: NAD. She appears well-developed and well-nourished HENT: Normocephalic. Atraumatic. Eyes: EOMI. No discharge. Cardiovascular: Normal rate, regular rhythm and normal heart sounds.  Respiratory: Effort normal and breath sounds normal.  GI: She exhibits no distension.  Musculoskeletal: No edema or tenderness in extremities Neurological: She is alert and oriented Motor: LUE 3/5 prox to distal  LLE 4/5 prox to distal  RUE/RLE 5/5 proximal to distal.  Skin: Skin is warm. Intact. Psychiatric: She has a normal mood and affect.   Assessment/Plan: 1. 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 Physiatrist is providing close team supervision and 24 hour management of active medical problems listed below. Physiatrist and rehab team continue to assess barriers to  discharge/monitor patient progress toward functional and medical goals.  Function:  Bathing Bathing position      Bathing parts      Bathing assist        Upper Body Dressing/Undressing Upper body dressing                    Upper body assist        Lower Body Dressing/Undressing Lower body dressing                                  Lower body assist        Toileting Toileting     Toileting steps completed by helper: Adjust clothing prior to toileting, Performs perineal hygiene, Adjust clothing after toileting    Toileting assist     Transfers Chair/bed transfer             Locomotion Ambulation           Wheelchair          Cognition Comprehension Comprehension assist level: Follows basic conversation/direction with no assist  Expression Expression assist level: Expresses basic needs/ideas: With extra time/assistive device  Social Interaction Social Interaction assist level: Interacts appropriately 90% of the time - Needs monitoring or encouragement for participation or interaction.  Problem Solving Problem solving assist level: Solves basic 90% of the time/requires cueing < 10% of the time  Memory Memory assist level: Recognizes or recalls 90% of the time/requires cueing < 10% of  the time     Medical Problem List and Plan:  1. Left-sided weakness secondary to right superior frontal scattered foci and right insular foci infarcts felt to be embolic   Begin CIR 2. DVT Prophylaxis/Anticoagulation: SCDs  3. Pain Management: N/A  4. Mood: LCSW to follow for evaluation and support.  5. Neuropsych: This patient is capable of making decisions on his own behalf.  6. Skin/Wound Care: routine pressure relief measures.  7. Fluids/Electrolytes/Nutrition: Maintain adequate nutritional and hydration status.  8. HTN: Difficult to control PTA. Permissive HTN with gradual normalization--continue low dose lisinopril and titrate upwards as indicated    Monitor with increased mobility, hypertensive crisis overnight 9. T2DM: Hgb A1C- 7.3. Check blood sugars before meals and at bedtime.   Patient had been on Glucophage 500 mg twice daily prior to admission   Monitor with increased mobility 10. CKD: Baseline SCr 1.6. Encourage fluid intake. Monitor renal status with serial checks for stability.   Creatinine 1.68 on 8/20  Labs pending  LOS (Days) 1 A FACE TO FACE EVALUATION WAS PERFORMED  Lilibeth Opie Lorie Phenix 02/28/2018 11:09 AM

## 2018-02-28 NOTE — Evaluation (Signed)
Occupational Therapy Assessment and Plan  Patient Details  Name: [name] MRN: [id number] Date of Birth: Dec 18, 1945  OT Diagnosis: abnormal posture, apraxia, cognitive deficits, hemiplegia affecting non-dominant side and muscle weakness (generalized)   Rehab Potential: Rehab Potential (ACUTE ONLY): Excellent ELOS: 14-16 days   Today's Date: 02/28/2018 OT Individual Time: 6378-5885 OT Individual Time Calculation (min): 80 min     Problem List:  Patient Active Problem List   Diagnosis Date Noted  . Chronic kidney disease (CKD), stage III (moderate) (HCC)   . Hypertensive crisis   . Embolic stroke ([place]) 02/77/4128  . Left hemiparesis ([place])   . Diabetes mellitus type 2 in nonobese (HCC)   . Acute ischemic stroke ([place])   . Left arm weakness   . Hypertensive urgency 02/24/2018  . Acute CVA (cerebrovascular accident) ([place]) 02/24/2018  . 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 Weakness of left upper extremity 02/24/2018  . Overweight (BMI 25.0-29.9) 02/24/2018  . CKD (chronic kidney disease), stage III ([place]) 02/24/2018  . Diabetes mellitus without complication ([place]) 78/67/6720  . HTN (hypertension) 09/02/2017  . Fall at home, initial encounter 09/02/2017  . Knee pain, bilateral 09/02/2017  . Weakness 09/02/2017    Past Medical History:  Past Medical History:  Diagnosis Date  . Allergy   . Arthritis   . Cataract   . Diabetes mellitus without complication ([place])   . Hypertension   . Macular degeneration syndrome    with edema---being treated.    Past Surgical History:  Past Surgical History:  Procedure Laterality Date  . callous removal  Left 2017   located on 1st digit on L foot 2/2 diabetes  . EYE SURGERY      Assessment & Plan Clinical Impression: [name] is a25 year old RH female with history of HTN, CKD--baseline SCr- 1.6, T2DM. Per chart review patient lives with spouse and daughter. Reportedly independent prior to admission. Son is  a cardiologist in [place]. Admitted on 02/24/18 with weakness, LUE weakness with heaviness and elevated BP. SBP>200. MRI/MRA brain done revealing clustered foci of acute/early subacute infarct within right frontoparietal junction with additional punctate infarct right insula and short segment right M2 division severe stenosis/near occlusion and tandem stenosis right A3. Carotid dopplers without evidence of significant ICA stenosis. 2D echo revealed EF 60-65% with no wall abnormality, severely calcified MV and grade 2 diastolic dysfunction. Dr. Leonie Man felt that stroke was embolic cue to unknown source --A fib suspected but patient declined TEE and loop recorder. To continue ASA/Plavix for 3 weeks followed by ASA alone. Physical and occupational therapy evaluations completed with recommendations of physical medicine rehab consult. Patient was admitted for a comprehensive rehab program.     Patient currently requires mod- max with basic self-care skills secondary to muscle weakness, decreased cardiorespiratoy endurance, impaired timing and sequencing, abnormal tone, unbalanced muscle activation, decreased coordination and decreased motor planning, decreased midline orientation, decreased attention to left, left side neglect and decreased motor planning, decreased initiation, decreased attention, decreased awareness, decreased problem solving and decreased safety awareness and decreased sitting balance, decreased standing balance, decreased postural control and hemiplegia.  Prior to hospitalization, patient could complete BADLs with independent .  Patient will benefit from skilled intervention to increase independence with basic self-care skills prior to discharge home with care partner.  Anticipate patient will require 24 hour supervision and follow up home health.  OT - End of Session Endurance Deficit: Yes Endurance Deficit Description: fatigued after propelling w/c x  66' OT Assessment Rehab Potential  (ACUTE ONLY): Excellent OT Barriers to Discharge: (N/A, has supportive family + walker accessible home environment) OT Patient demonstrates impairments in the following area(s): Balance;Cognition;Perception;Safety;Endurance;Motor OT Basic ADL's Functional Problem(s): Grooming;Bathing;Dressing;Toileting OT Advanced ADL's Functional Problem(s): Simple Meal Preparation OT Transfers Functional Problem(s): Toilet;Tub/Shower OT Additional Impairment(s): Fuctional Use of Upper Extremity OT Plan OT Intensity: Minimum of 1-2 x/day, 45 to 90 minutes OT Frequency: 5 out of 7 days OT Duration/Estimated Length of Stay: 14-16 days OT Treatment/Interventions: Balance/vestibular training;Community reintegration;Disease mangement/prevention;Functional electrical stimulation;Neuromuscular re-education;Patient/family education;Self Care/advanced ADL retraining;Splinting/orthotics;Therapeutic Exercise;UE/LE Coordination activities;Wheelchair propulsion/positioning;Visual/perceptual remediation/compensation;UE/LE Strength taining/ROM;Therapeutic Activities;Skin care/wound managment;Psychosocial support;Pain management;Functional mobility training;DME/adaptive equipment instruction;Discharge planning;Cognitive remediation/compensation OT Self Feeding Anticipated Outcome(s): No goal OT Basic Self-Care Anticipated Outcome(s): Supervision/cuing  OT Toileting Anticipated Outcome(s): Supervision/cuing  OT Bathroom Transfers Anticipated Outcome(s): Supervision/cuing  OT Recommendation Recommendations for Other Services: Speech consult Patient destination: Home Follow Up Recommendations: Home health OT Equipment Recommended: To be determined   Skilled Therapeutic Intervention Pt greeted up in chair with dtr present. Agreeable to tx and reported pain to be manageable without RN interventions. Skilled OT session completed with focus on initial evaluation, education on OT role/POC, and establishment of patient centered  goals. All functional transfers completed at ambulatory level with Mod A and Lt side supported. She bathed at shower level and then dressed EOB with Mod A sit<stand. Pt with Lt inattention and required HOH assist to use L UE functionally during self care. Poster LOBs EOB during LB dressing due to trunk weakness. She required Min-Mod A for sitting balance and Max A for tasks. Near end of session pt was increased buckling in Lt LE when standing at sink during oral care. OT facilitated L UE weightbearing onto supportive surfaces throughout session.  Dtr also present, asking appropriate questions, and involved. At end of session pt was left in chair with all needs and dtr present.   OT Evaluation Precautions/Restrictions  Precautions Precautions: Fall Precaution Comments: Lt hemi Restrictions Weight Bearing Restrictions: No General Chart Reviewed: Yes Family/Caregiver Present: Yes(Dtr [name]) Pain Pain Assessment Pain Score: 0-No pain Home Living/Prior Functioning Home Living Family/patient expects to be discharged to:: Private residence Living Arrangements: Spouse/significant other, Children Available Help at Discharge: Family Type of Home: House Home Access: Stairs to enter Technical brewer of Steps: 2 Entrance Stairs-Rails: Can reach both, Right, Left Home Layout: One level Bathroom Shower/Tub: Multimedia programmer: Handicapped height Bathroom Accessibility: Yes  Lives With: Spouse(Dtr lives nearby ) IADL History Homemaking Responsibilities: Yes(Spouse took care of the majority of IADLs) Laundry Responsibility: Primary Occupation: Retired Type of Occupation: Scientist, physiological for several departments at [employer] and Hobbies: Spending time with family  Prior Function Level of Independence: Independent with basic ADLs  Able to Take Stairs?: Yes Driving: Yes(Spouse did most of driving) Vocation: Retired Leisure: Hobbies-yes (Comment) Comments: reads and watches  TV ADL ADL ADL Comments: Please see functional navigator for ADL status Vision Baseline Vision/History: Wears glasses Wears Glasses: Reading only Patient Visual Report: No change from baseline Vision Assessment?: No apparent visual deficits(Able to read wall clock, and names of her doctor/RN on board in room) Perception  Perception: Impaired Inattention/Neglect: Does not attend to left visual field;Does not attend to left side of body Praxis Praxis: Impaired Praxis Impairment Details: Initiation;Perseveration;Motor planning Cognition Overall Cognitive Status: Within Functional Limits for tasks assessed Arousal/Alertness: Awake/alert Orientation Level: Person;Place;Situation Person: Oriented Place: Oriented Situation: Oriented Year: 2019 Month: August Day of Week: Correct Memory: Appears intact Immediate Memory Recall: Sock;Blue;Bed  Memory Recall: Sock;Blue;Bed Memory Recall Sock: Without Cue Memory Recall Blue: Without Cue Memory Recall Bed: Without Cue Attention: Focused;Sustained Focused Attention: Appears intact Sustained Attention: Appears intact Awareness: Impaired Problem Solving: Impaired Safety/Judgment: Impaired Sensation Sensation Light Touch: Appears Intact(L UE) Proprioception: Impaired Detail Proprioception Impaired Details: Impaired LUE Stereognosis: Not tested Coordination Gross Motor Movements are Fluid and Coordinated: No Fine Motor Movements are Fluid and Coordinated: No Coordination and Movement Description: Lt hemiparesis Motor  Motor Motor: Abnormal tone Motor - Skilled Clinical Observations: tone abnormalities in L UE/LE Mobility  Bed Mobility Bed Mobility: Rolling Right;Rolling Left;Left Sidelying to Sit Rolling Right: Minimal Assistance - Patient > 75% Rolling Left: Minimal Assistance - Patient > 75% Left Sidelying to Sit: Moderate Assistance - Patient 50-74% Transfers Sit to Stand: Moderate Assistance - Patient 50-74%  Trunk/Postural  Assessment  Cervical Assessment Cervical Assessment: Within Functional Limits Thoracic Assessment Thoracic Assessment: Within Functional Limits Lumbar Assessment Lumbar Assessment: Exceptions to WFL(Increased Lt weightbearing in sitting) Postural Control Postural Control: Deficits on evaluation Righting Reactions: no awareness of L lean in sitting Protective Responses: delayed and inadequate  Balance Balance Balance Assessed: Yes Dynamic Sitting Balance Dynamic Sitting - Balance Support: Feet supported;Right upper extremity supported Dynamic Sitting - Level of Assistance: 3: Mod assist(Dressing LB EOB) Static Standing Balance Static Standing - Level of Assistance: 4: Min assist Dynamic Standing Balance Dynamic Standing - Balance Support: During functional activity;No upper extremity supported Dynamic Standing - Level of Assistance: 3: Mod assist Dynamic Standing - Balance Activities: Lateral lean/weight shifting;Forward lean/weight shifting Dynamic Standing - Comments: LB dressing Extremity/Trunk Assessment RUE Assessment RUE Assessment: Within Functional Limits LUE Assessment LUE Assessment: Exceptions to [facility] LUE Body System: Neuro Brunstrum levels for arm and hand: Hand;Arm Brunstrum level for arm and hand: Stage II Synergy is developing LUE Tone LUE Tone: Hypertonic(flexor synergy in hand, extensor synergy in arm)   See Function Navigator for Current Functional Status.   Refer to Care Plan for Long Term Goals  Recommendations for other services: Other: SLP   Discharge Criteria: Patient will be discharged from OT if patient refuses treatment 3 consecutive times without medical reason, if treatment goals not met, if there is a change in medical status, if patient makes no progress towards goals or if patient is discharged from hospital.  The above assessment, treatment plan, treatment alternatives and goals were discussed and mutually agreed upon: by patient and by  family  Skeet Simmer 02/28/2018, 12:48 PM

## 2018-02-28 NOTE — Progress Notes (Signed)
Physical Therapy Session Note  Patient Details  Name: [name] MRN: [id number] Date of Birth: 1945/07/26  Today's Date: 02/28/2018 PT Individual Time: 6712-4580 PT Individual Time Calculation (min): 60 min  Short Term Goals: Week 1:  PT Short Term Goal 1 (Week 1): pt will roll R wiht 1 cues or less for LUE PT Short Term Goal 2 (Week 1): pt will move L sidelying> sit with min assist PT Short Term Goal 3 (Week 1): pt will transfer bed>< w/c to L and R with mod assist PT Short Term Goal 4 (Week 1): pt will propel w/c x 150 with supervsion PT Short Term Goal 5 (Week 1): pt will perform gait x 150' with multiple turns with min assist, LRAD  Skilled Therapeutic Interventions/Progress Updates:  Pt seated on BSC with assistance of Erin, NT,  for continent B and B.  Pt stood up with mod assist, and stood with min assist as she used  washcloth for hygiene.  Hand washing from w/c level with min assist.  Stand pivot transfers with max assist, max cues, to L and R.  Pt locked/unlocked w/c brakes with hand over hand assistance and max cues for L brake, min cue only for R brake.  Neuromuscular re-education via forced use, L hand splint for alternating reciprocal movement x 4 extremities, on NuStep at level 4 x 8 min 30 seconds, rated light exercise. Pt was unable to recall duration later in session although we had talked about it.  Reciprocal scooting forward/backward in sitting on mat for pelvic dissociation, L attention.  Also in sitting, pt performed reaching activity, (wiithin BOS) R/L in sitting to facilitate trunk shortening/lengthening/rotating and L hand grasp.  Seated trunk flexion/extension blocked practice to facilitate forward wt shift for transfers.   Pt needed max cues for route finding back to room.  Pt left resting in wc with seat alarm set, dtr in room, and set up for dinner.     Pain: pt denies         See Function Navigator for Current Functional  Status.   Therapy/Group: Individual Therapy  Demarian Epps 02/28/2018, 4:57 PM

## 2018-02-28 NOTE — Plan of Care (Signed)
LTGs established 02/28/18

## 2018-02-28 NOTE — Plan of Care (Signed)
  Problem: Consults Goal: RH STROKE PATIENT EDUCATION Description See Patient Education module for education specifics  Outcome: Progressing   Problem: RH SKIN INTEGRITY Goal: RH STG SKIN FREE OF INFECTION/BREAKDOWN Description With cues/reminders  Outcome: Progressing Goal: RH STG MAINTAIN SKIN INTEGRITY WITH ASSISTANCE Description STG Maintain Skin Integrity With cues/reminders Assistance.  Outcome: Progressing Goal: RH STG ABLE TO PERFORM INCISION/WOUND CARE W/ASSISTANCE Description STG Able To Perform Incision/Wound Care With min Assistance.  Outcome: Progressing   Problem: RH SAFETY Goal: RH STG ADHERE TO SAFETY PRECAUTIONS W/ASSISTANCE/DEVICE Description STG Adhere to Safety Precautions With cues/reminders Assistance/Device.  Outcome: Progressing   Problem: RH KNOWLEDGE DEFICIT Goal: RH STG INCREASE KNOWLEDGE OF DIABETES Description Pt will be able to explain management of DM using handouts/education with cues/reminders  Outcome: Progressing Goal: RH STG INCREASE KNOWLEDGE OF HYPERTENSION Description Pt will be able to manage HTN using handouts, booklets with cues/reminders  Outcome: Progressing

## 2018-03-01 ENCOUNTER — Inpatient Hospital Stay (HOSPITAL_COMMUNITY): Payer: Medicare Other | Admitting: Occupational Therapy

## 2018-03-01 MED ORDER — FEXOFENADINE HCL 60 MG PO TABS
60.0000 mg | ORAL_TABLET | Freq: Every day | ORAL | Status: DC
  Administered 2018-03-02 – 2018-03-14 (×13): 60 mg via ORAL
  Filled 2018-03-01 (×14): qty 1

## 2018-03-01 MED ORDER — HYDRALAZINE HCL 10 MG PO TABS
10.0000 mg | ORAL_TABLET | Freq: Three times a day (TID) | ORAL | Status: DC | PRN
  Administered 2018-03-01 – 2018-03-02 (×3): 10 mg via ORAL
  Filled 2018-03-01 (×3): qty 1

## 2018-03-01 MED ORDER — LISINOPRIL 5 MG PO TABS
7.5000 mg | ORAL_TABLET | Freq: Every day | ORAL | Status: DC
  Administered 2018-03-01: 5 mg via ORAL
  Administered 2018-03-02 – 2018-03-04 (×3): 7.5 mg via ORAL
  Filled 2018-03-01 (×5): qty 1

## 2018-03-01 MED ORDER — LISINOPRIL 5 MG PO TABS: 5.0000 mg | ORAL_TABLET | Freq: Every day | ORAL | Status: DC

## 2018-03-01 NOTE — Progress Notes (Signed)
Occupational Therapy Session Note  Patient Details  Name: [name] MRN: [id number] Date of Birth: December 17, 1945  Today's Date: 03/01/2018 OT Individual Time: 1004-1059 OT Individual Time Calculation (min): 55 min   Short Term Goals: Week 1:  OT Short Term Goal 1 (Week 1): Pt will complete 1/3 components of donning pants OT Short Term Goal 2 (Week 1): Pt will complete toilet transfer with Min [name] and LRAD OT Short Term Goal 3 (Week 1): Pt will scan to Lt for ADL items with min vcs during 2 consecutive ADL sessions   Skilled Therapeutic Interventions/Progress Updates:    Pt greeted in recliner with dtr [name] present. No c/o pain. Started session with LB dressing. She was able to thread B LEs into pants with heavy reliance on back support of recliner. Able to don Rt gripper sock utilizing figure 4 and hemi techniques. Stand pivot<w/c<toilet completed with Mod [name] and cues for technique. Clothing mgt x2 completed with Mod [name] for balance. Afterwards, oral care and grooming tasks completed while standing with facilitation of L UE as gross stabilizer. Pt exhibited more gross motor control in hand today. Able to squeeze toothpaste! For remainder of session, worked on midline orientation, standing balance, and Lt NMR via towel slides at elevated table. Pt exhibits increased Lt weightbearing in standing. With use of mirror for visual feedback, worked on increasing awareness of neutral alignment. During towel slides, focused on control of movement, particularly ER due to flexor synergies. When she fatigued, continued with towel slides while seated. Also initiated pt/family education regarding AAROM and self ROM techniques. Discussed different exercises they could complete together, joint protection strategies, and positions to avoid for preventing impingement. At end of session pt was left in dayroom for dance group, then taken back to room by daughter.   Therapy Documentation Precautions:   Precautions Precautions: Fall Precaution Comments: Lt hemi Restrictions Weight Bearing Restrictions: No Vital Signs: Therapy Vitals Pulse Rate: 76 BP: (!) 175/63 Patient Position (if appropriate): Sitting Oxygen Therapy SpO2: 96 % Pain: No c/o pain during tx    ADL: ADL ADL Comments: Please see functional navigator for ADL status     See Function Navigator for Current Functional Status.   Therapy/Group: Individual Therapy  [name] [name] 03/01/2018, 12:37 PM

## 2018-03-01 NOTE — Progress Notes (Signed)
Daughter requesting to give 5mg  of Lisinopril instead of scheduled dose. Requesting to call MD to change dose. MD notified. No change in order. Daughter notified.

## 2018-03-01 NOTE — Progress Notes (Signed)
NT reported pt's bp 200/70. Prn dose hydralazine given.

## 2018-03-01 NOTE — Progress Notes (Signed)
[facility]IC[facility]ILIT[facility]     PROGRESS NOTE  Subjective/Complaints:  Patient seen lying in bed this morning. Daughter at bedside. Patient states she slept well overnight. She states she had a good first in therapies. Daughter with questions about blood pressure medications.  ROS: denies CP, SOB, N/V/D  Objective: Vital Signs: Blood pressure (!) 182/61, pulse 80, temperature (!) 97.4 F (36.3 C), temperature source Oral, resp. rate 17, weight 68.9 kg, SpO2 98 %. No results found. No results for input(s): WBC, HGB, HCT, PLT in the last 72 hours. No results for input(s): NA, K, CL, GLUCOSE, BUN, CREATININE, CALCIUM in the last 72 hours.  Invalid input(s): CO CBG (last 3)  Recent Labs    02/26/18 2217 02/27/18 0751 02/27/18 1148  GLUCAP 146* 153* 174*    Wt Readings from Last 3 Encounters:  02/27/18 68.9 kg  02/24/18 71.3 kg  02/24/18 71.4 kg    Physical Exam:  BP (!) 182/61   Pulse 80   Temp (!) 97.4 F (36.3 C) (Oral)   Resp 17   Wt 68.9 kg   SpO2 98%   BMI 26.70 kg/m  Constitutional: NAD. She appears well-developed and well-nourished HENT: Normocephalic. Atraumatic. Eyes: EOMI. No discharge. Cardiovascular: RRR. No JVD.  Respiratory: Effort normal and breath sounds normal.  GI: She exhibits no distension.  Musculoskeletal: No edema or tenderness in extremities Neurological: She is alert and oriented Motor: LUE 3/5 prox to distal (stable) LLE 4/5 prox to distal (stable) RUE/RLE 5/5 proximal to distal.  Skin: Skin is warm. Intact. Psychiatric: She has a normal mood and affect.   Assessment/Plan: 1. 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 Physiatrist is providing close team supervision and 24 hour management of active medical problems listed below. Physiatrist and rehab team  continue to assess barriers to discharge/monitor patient progress toward functional and medical goals.  Function:  Bathing Bathing position   Position: Shower  Bathing parts Body parts bathed by patient: Chest, Abdomen, Front perineal area, Right upper leg, Left upper leg Body parts bathed by helper: Right arm, Left arm, Buttocks, Right lower leg, Left lower leg, Back  Bathing assist        Upper Body Dressing/Undressing Upper body dressing   What is the patient wearing?: Pull over shirt/dress     Pull over shirt/dress - Perfomed by patient: Thread/unthread right sleeve Pull over shirt/dress - Perfomed by helper: Thread/unthread left sleeve, Pull shirt over trunk, Put head through opening        Upper body assist        Lower Body Dressing/Undressing Lower body dressing   What is the patient wearing?: Underwear, Pants, Non-skid slipper socks, Shoes   Underwear - Performed by helper: Thread/unthread right underwear leg, Pull underwear up/down Pants- Performed by patient: Thread/unthread right pants leg, Thread/unthread left pants leg, Pull pants up/down     Non-skid slipper socks- Performed by helper: Don/doff right sock, Don/doff left sock     Shoes - Performed by patient: Don/doff right shoe Shoes - Performed by helper: Don/doff left shoe          Lower body assist Assist for lower body dressing: (Max A)      Toileting Toileting Toileting activity did not occur: No continent bowel/bladder event   Toileting steps completed by helper: Adjust clothing prior to toileting, Performs perineal hygiene, Adjust  clothing after toileting    Toileting assist Assist level: Two helpers   Transfers Chair/bed transfer   Chair/bed transfer method: Squat pivot Chair/bed transfer assist level: Maximal assist (Pt 25 - 49%/lift and lower) Chair/bed transfer assistive device: Armrests     Locomotion Ambulation     Max distance: 50 Assist level: Moderate assist (Pt 50 - 74%)    Wheelchair   Type: Manual Max wheelchair distance: 20 Assist Level: Maximal assistance (Pt 25 - 49%)  Cognition Comprehension Comprehension assist level: Follows basic conversation/direction with no assist  Expression Expression assist level: Expresses complex 90% of the time/cues < 10% of the time  Social Interaction Social Interaction assist level: Interacts appropriately with others with medication or extra time (anti-anxiety, antidepressant).  Problem Solving Problem solving assist level: Solves basic 50 - 74% of the time/requires cueing 25 - 49% of the time  Memory Memory assist level: Recognizes or recalls 90% of the time/requires cueing < 10% of the time     Medical Problem List and Plan:  1. Left-sided weakness secondary to right superior frontal scattered foci and right insular foci infarcts felt to be embolic   Continue CIR 2. DVT Prophylaxis/Anticoagulation: SCDs  3. Pain Management: N/A  4. Mood: LCSW to follow for evaluation and support.  5. Neuropsych: This patient is capable of making decisions on his own behalf.  6. Skin/Wound Care: routine pressure relief measures.  7. Fluids/Electrolytes/Nutrition: Maintain adequate nutritional and hydration status.  8. HTN: Difficult to control PTA.   Lisinopril increased 7.5 mg and 8/25  Hydralazine 10 mg every 8 when necessary  Remains elevated with hypertensive crisis 9. T2DM: Hgb A1C- 7.3. Check blood sugars before meals and at bedtime.   Patient had been on Glucophage 500 mg twice daily prior to admission   Monitor with increased mobility  ? Elevated, inconsistently recorded values 10. CKD: Baseline SCr 1.6. Encourage fluid intake. Monitor renal status with serial checks for stability.   Creatinine 1.68 on 8/20  Labs ordered for tomorrow  LOS (Days) 2 A FACE TO FACE EVALUATION WAS PERFORMED  Rashod Gougeon Lorie Phenix 03/01/2018 8:09 AM

## 2018-03-02 ENCOUNTER — Inpatient Hospital Stay (HOSPITAL_COMMUNITY): Payer: Medicare Other | Admitting: Physical Therapy

## 2018-03-02 ENCOUNTER — Inpatient Hospital Stay (HOSPITAL_COMMUNITY): Payer: Medicare Other | Admitting: Occupational Therapy

## 2018-03-02 DIAGNOSIS — I6310 Cerebral infarction due to embolism of unspecified precerebral artery: Secondary | ICD-10-CM

## 2018-03-02 LAB — COMPREHENSIVE METABOLIC PANEL
ALBUMIN: 3.1 g/dL — AB (ref 3.5–5.0)
ALT: 16 U/L (ref 0–44)
AST: 14 U/L — AB (ref 15–41)
Alkaline Phosphatase: 90 U/L (ref 38–126)
Anion gap: 5 (ref 5–15)
BUN: 36 mg/dL — ABNORMAL HIGH (ref 8–23)
CHLORIDE: 115 mmol/L — AB (ref 98–111)
CO2: 19 mmol/L — AB (ref 22–32)
CREATININE: 1.71 mg/dL — AB (ref 0.44–1.00)
Calcium: 8.6 mg/dL — ABNORMAL LOW (ref 8.9–10.3)
GFR calc non Af Amer: 29 mL/min — ABNORMAL LOW (ref >60)
GFR, EST AFRICAN AMERICAN: 33 mL/min — AB (ref >60)
GLUCOSE: 145 mg/dL — AB (ref 70–99)
Potassium: 4 mmol/L (ref 3.5–5.1)
SODIUM: 139 mmol/L (ref 135–145)
Total Bilirubin: 0.6 mg/dL (ref 0.3–1.2)
Total Protein: 5.9 g/dL — ABNORMAL LOW (ref 6.5–8.1)

## 2018-03-02 LAB — CBC WITH DIFFERENTIAL/PLATELET
ABS IMMATURE GRANULOCYTES: 0 10*3/uL (ref 0.0–0.1)
BASOS ABS: 0.1 10*3/uL (ref 0.0–0.1)
BASOS PCT: 2 %
Eosinophils Absolute: 0.4 10*3/uL (ref 0.0–0.7)
Eosinophils Relative: 5 %
HCT: 33.7 % — ABNORMAL LOW (ref 36.0–46.0)
Hemoglobin: 10.9 g/dL — ABNORMAL LOW (ref 12.0–15.0)
Immature Granulocytes: 0 %
Lymphocytes Relative: 28 %
Lymphs Abs: 2.3 10*3/uL (ref 0.7–4.0)
MCH: 27.6 pg (ref 26.0–34.0)
MCHC: 32.3 g/dL (ref 30.0–36.0)
MCV: 85.3 fL (ref 78.0–100.0)
Monocytes Absolute: 0.8 10*3/uL (ref 0.1–1.0)
Monocytes Relative: 9 %
NEUTROS ABS: 4.7 10*3/uL (ref 1.7–7.7)
NEUTROS PCT: 56 %
PLATELETS: 285 10*3/uL (ref 150–400)
RBC: 3.95 MIL/uL (ref 3.87–5.11)
RDW: 13.3 % (ref 11.5–15.5)
WBC: 8.2 10*3/uL (ref 4.0–10.5)

## 2018-03-02 LAB — GLUCOSE, CAPILLARY
GLUCOSE-CAPILLARY: 193 mg/dL — AB (ref 70–99)
GLUCOSE-CAPILLARY: 154 mg/dL — AB (ref 70–99)
GLUCOSE-CAPILLARY: 129 mg/dL — AB (ref 70–99)
GLUCOSE-CAPILLARY: 174 mg/dL — AB (ref 70–99)
GLUCOSE-CAPILLARY: 120 mg/dL — AB (ref 70–99)
GLUCOSE-CAPILLARY: 135 mg/dL — AB (ref 70–99)
GLUCOSE-CAPILLARY: 119 mg/dL — AB (ref 70–99)
Glucose-Capillary: 227 mg/dL — ABNORMAL HIGH (ref 70–99)
Glucose-Capillary: 156 mg/dL — ABNORMAL HIGH (ref 70–99)
Glucose-Capillary: 143 mg/dL — ABNORMAL HIGH (ref 70–99)
Glucose-Capillary: 140 mg/dL — ABNORMAL HIGH (ref 70–99)
Glucose-Capillary: 174 mg/dL — ABNORMAL HIGH (ref 70–99)
Glucose-Capillary: 113 mg/dL — ABNORMAL HIGH (ref 70–99)
Glucose-Capillary: 164 mg/dL — ABNORMAL HIGH (ref 70–99)

## 2018-03-02 NOTE — Progress Notes (Signed)
Occupational Therapy Session Note  Patient Details  Name: [name] MRN: [id number] Date of Birth: 09-26-1945  Today's Date: 03/02/2018 OT Individual Time: 0832-1000 OT Individual Time Calculation (min): 88 min    Short Term Goals: Week 1:  OT Short Term Goal 1 (Week 1): Pt will complete 1/3 components of donning pants OT Short Term Goal 2 (Week 1): Pt will complete toilet transfer with Min A and LRAD OT Short Term Goal 3 (Week 1): Pt will scan to Lt for ADL items with min vcs during 2 consecutive ADL sessions   Skilled Therapeutic Interventions/Progress Updates:    Treatment session with focus on ADL retraining with functional transfers, sit > stand, and functional use of LUE during self-care tasks of bathing and dressing.  Squat pivot transfer recliner > w/c > tub bench with mod assist for lifting and assist to control transfer.  Engaged in bathing in room shower at sit > stand level with assist to maintain standing balance while pt washed buttocks.  Therapist provided hand over hand assist when utilizing LUE to wash Rt arm.  Engaged in dressing with education on hemi-dressing technique and use of figure 4 position when donning underwear, pants, and socks. Cues for hand placement with sit > stand to push up from surface instead of pulling up on sink or grab bar.  Pt able to brush hair this session with setup assist.  Engaged in LUE NMR in sitting with focus on shoulder and elbow flexion/extension.  Pt able to reach towards items on table with use of table to slide on as well as able to illicit shoulder elevation.  Pt with minimal wrist flexion and no active finger flexion to grasp items on table.  Returned to room and left upright in recliner with all needs in reach.  Therapy Documentation Precautions:  Precautions Precautions: Fall Precaution Comments: Lt hemi Restrictions Weight Bearing Restrictions: No Pain:   Pt with no c/o pain  See Function Navigator for Current Functional  Status.   Therapy/Group: Individual Therapy  [name] 03/02/2018, 9:46 AM

## 2018-03-02 NOTE — Progress Notes (Signed)
Orthopedic Tech Progress Note Patient Details:  [name] 09-May-1946 [id number]  Patient ID: [name], female   DOB: 1946-01-21, 72 y.o.   MRN: [id number]   [name] 03/02/2018, 11:19 AM Called in hanger brace order;poke with Fernanda Drum

## 2018-03-02 NOTE — Progress Notes (Signed)
Physical Therapy Note  Patient Details  Name: [name] MRN: [id number] Date of Birth: [birth year] Today's Date: 03/02/2018    Spoke with PA Linna Hoff) regarding BP parameters with Linna Hoff verbally stating <220/120 mmHg.   [name] 03/02/2018, 3:41 PM

## 2018-03-02 NOTE — Progress Notes (Signed)
[facility] PHYSICAL MEDICINE & REHABILITATION     PROGRESS NOTE  Subjective/Complaints:  Dicussed blood work with daughter  ROS: denies CP, SOB, N/V/D  Objective: Vital Signs: Blood pressure (!) 165/65, pulse 72, temperature 98.7 F (37.1 C), temperature source Oral, resp. rate 17, weight 68.9 kg, SpO2 97 %. No results found. Recent Labs    03/02/18 0545  WBC 8.2  HGB 10.9*  HCT 33.7*  PLT 285   Recent Labs    03/02/18 0545  NA 139  K 4.0  CL 115*  GLUCOSE 145*  BUN 36*  CREATININE 1.71*  CALCIUM 8.6*   CBG (last 3)  Recent Labs    03/01/18 0655 03/01/18 1210 03/01/18 1643  GLUCAP 143* 120* 140*    Wt Readings from Last 3 Encounters:  02/27/18 68.9 kg  02/24/18 71.3 kg  02/24/18 71.4 kg    Physical Exam:  BP (!) 165/65 (BP Location: Left Arm)   Pulse 72   Temp 98.7 F (37.1 C) (Oral)   Resp 17   Wt 68.9 kg   SpO2 97%   BMI 26.70 kg/m  Constitutional: NAD. She appears well-developed and well-nourished HENT: Normocephalic. Atraumatic. Eyes: EOMI. No discharge. Cardiovascular: RRR. No JVD.  Respiratory: Effort normal and breath sounds normal.  GI: She exhibits no distension.  Musculoskeletal: No edema or tenderness in extremities Neurological: She is alert and oriented Motor: LUE 3/5 prox to distal (stable) LLE 4/5 prox to distal (stable) RUE/RLE 5/5 proximal to distal.  Skin: Skin is warm. Intact. Psychiatric: She has a normal mood and affect.   Assessment/Plan: 1. 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 Physiatrist is providing close team supervision and 24 hour management of active medical problems listed below. Physiatrist and rehab team continue to assess barriers to discharge/monitor patient progress toward functional and medical goals.  Function:  Bathing Bathing  position   Position: Shower  Bathing parts Body parts bathed by patient: Chest, Abdomen, Front perineal area, Right upper leg, Left upper leg Body parts bathed by helper: Right arm, Left arm, Buttocks, Right lower leg, Left lower leg, Back  Bathing assist        Upper Body Dressing/Undressing Upper body dressing   What is the patient wearing?: Pull over shirt/dress     Pull over shirt/dress - Perfomed by patient: Thread/unthread right sleeve Pull over shirt/dress - Perfomed by helper: Thread/unthread left sleeve, Pull shirt over trunk, Put head through opening        Upper body assist        Lower Body Dressing/Undressing Lower body dressing   What is the patient wearing?: Underwear, Pants, Non-skid slipper socks, Shoes   Underwear - Performed by helper: Thread/unthread right underwear leg, Pull underwear up/down Pants- Performed by patient: Thread/unthread right pants leg, Thread/unthread left pants leg Pants- Performed by helper: Pull pants up/down Non-skid slipper socks- Performed by patient: Don/doff right sock Non-skid slipper socks- Performed by helper: Don/doff left sock     Shoes - Performed by patient: Don/doff right shoe Shoes - Performed by helper: Don/doff left shoe          Lower body assist Assist for lower body dressing: (Max A)      Toileting Toileting Toileting activity did not occur: No continent bowel/bladder event Toileting steps completed by patient: Adjust clothing prior to toileting, Performs perineal hygiene Toileting steps completed by  helper: Adjust clothing after toileting    Toileting assist Assist level: Touching or steadying assistance (Pt.75%)   Transfers Chair/bed transfer   Chair/bed transfer method: Squat pivot Chair/bed transfer assist level: Maximal assist (Pt 25 - 49%/lift and lower) Chair/bed transfer assistive device: Armrests     Locomotion Ambulation     Max distance: 50 Assist level: Moderate assist (Pt 50 - 74%)    Wheelchair   Type: Manual Max wheelchair distance: 20 Assist Level: Maximal assistance (Pt 25 - 49%)  Cognition Comprehension Comprehension assist level: Follows basic conversation/direction with no assist  Expression Expression assist level: Expresses complex 90% of the time/cues < 10% of the time  Social Interaction Social Interaction assist level: Interacts appropriately with others with medication or extra time (anti-anxiety, antidepressant).  Problem Solving Problem solving assist level: Solves basic 50 - 74% of the time/requires cueing 25 - 49% of the time  Memory Memory assist level: Recognizes or recalls 90% of the time/requires cueing < 10% of the time     Medical Problem List and Plan:  1. Left-sided weakness secondary to right superior frontal scattered foci and right insular foci infarcts felt to be embolic   Continue CIR PT, OT, SLP 2. DVT Prophylaxis/Anticoagulation: SCDs  3. Pain Management: N/A  4. Mood: LCSW to follow for evaluation and support.  5. Neuropsych: This patient is capable of making decisions on his own behalf.  6. Skin/Wound Care: routine pressure relief measures.  7. Fluids/Electrolytes/Nutrition: Maintain adequate nutritional and hydration status. I 56ml 8. HTN: Difficult to control PTA.   Lisinopril increased 7.5 mg and 8/25- mild cough, ? Related to ACE, pt states she had a cough "all summer" , consider ARB  Hydralazine 10 mg every 8 when necessary   Vitals:   03/01/18 1928 03/02/18 0514  BP: (!) 156/98 (!) 165/65  Pulse: 77 72  Resp: 17 17  Temp: 97.6 F (36.4 C) 98.7 F (37.1 C)  SpO2: 97% 97%   9. T2DM: Hgb A1C- 7.3. Check blood sugars before meals and at bedtime.   Patient had been on Glucophage 500 mg twice daily prior to admission   Monitor with increased mobility  ? Elevated, inconsistently recorded values 10. CKD: Baseline SCr 1.6. Encourage fluid intake. Monitor renal status with serial checks for stability.   Creatinine 1.68 on  8/20- stable vs ~12mo ago  BUN mildly elevated 11.  Hgb 10.9gm- baseline ~12 will check stool OB LOS (Days) 3 A FACE TO FACE EVALUATION WAS PERFORMED  Charlett Blake 03/02/2018 8:54 AM

## 2018-03-02 NOTE — IPOC Note (Signed)
Overall Plan of Care [facility]) Patient Details Name: KALLI GREENFIELD MRN: [id number] DOB: 1945/08/01  Admitting Diagnosis: <principal problem not specified>  Hospital Problems: Active Problems:   Embolic stroke (HCC)   Left hemiparesis ([place])   Diabetes mellitus type 2 in nonobese ([place])   Chronic kidney disease (CKD), stage III (moderate) ([place])   Hypertensive crisis     Functional Problem List: Nursing Safety, Medication Management, Endurance, Skin Integrity  PT Balance, Endurance, Motor, Safety, Sensory  OT Balance, Cognition, Perception, Safety, Endurance, Motor  SLP    TR         Basic ADL's: OT Grooming, Bathing, Dressing, Toileting     Advanced  ADL's: OT Simple Meal Preparation     Transfers: PT Bed Mobility, Bed to Chair, Car, Manufacturing systems engineer, Metallurgist: PT Ambulation, Emergency planning/management officer, Stairs     Additional Impairments: OT Fuctional Use of Upper Extremity  SLP        TR      Anticipated Outcomes Item Anticipated Outcome  Self Feeding No goal  Swallowing      Basic self-care  Supervision/cuing   Toileting  Supervision/cuing    Bathroom Transfers Supervision/cuing   Bowel/Bladder  n/a  Transfers  supervision basic and furniture; min assist car  Locomotion  supervision gait x 150' with LRAD and up/down 12 steps 2 rails, and w/c propulsion x 150 wiht supervision  Communication     Cognition     Pain  n/a  Safety/Judgment  maintain safety with cues/reminders   Therapy Plan: PT Intensity: Minimum of 1-2 x/day ,45 to 90 minutes PT Frequency: 5 out of 7 days PT Duration Estimated Length of Stay: 12-14 OT Intensity: Minimum of 1-2 x/day, 45 to 90 minutes OT Frequency: 5 out of 7 days OT Duration/Estimated Length of Stay: 14-16 days      Team Interventions: Nursing Interventions Discharge Planning, Skin Care/Wound Management, Disease Management/Prevention, Patient/Family Education, Medication Management  PT  interventions Ambulation/gait training, Community reintegration, Engineer, drilling, Neuromuscular re-education, Psychosocial support, Stair training, UE/LE Strength taining/ROM, Wheelchair propulsion/positioning, Training and development officer, Discharge planning, Functional electrical stimulation, Pain management, Therapeutic Activities, UE/LE Coordination activities, Cognitive remediation/compensation, Functional mobility training, Patient/family education, Splinting/orthotics, Therapeutic Exercise, Visual/perceptual remediation/compensation  OT Interventions Training and development officer, Community reintegration, Disease mangement/prevention, Functional electrical stimulation, Neuromuscular re-education, Patient/family education, Self Care/advanced ADL retraining, Splinting/orthotics, Therapeutic Exercise, UE/LE Coordination activities, Wheelchair propulsion/positioning, Visual/perceptual remediation/compensation, UE/LE Strength taining/ROM, Therapeutic Activities, Skin care/wound managment, Psychosocial support, Pain management, Functional mobility training, DME/adaptive equipment instruction, Discharge planning, Cognitive remediation/compensation  SLP Interventions    TR Interventions    SW/CM Interventions Discharge Planning, Psychosocial Support, Patient/Family Education   Barriers to Discharge MD  Medical stability  Nursing      PT      OT (N/A, has supportive family + walker accessible home environment)    SLP      SW       Team Discharge Planning: Destination: PT-Home ,OT- Home , SLP-  Projected Follow-up: PT-Home health PT, OT-  Home health OT, SLP-  Projected Equipment Needs: PT-To be determined, OT- To be determined, SLP-  Equipment Details: PT-pt has currenlty borrowed RW and SPC, OT-  Patient/family involved in discharge planning: PT- Patient, Family Midwife,  OT-Patient, Family member/caregiver, SLP-   MD ELOS: 10-13d Medical Rehab Prognosis:   Good Assessment:  a38 year old RH female with history of HTN, CKD--baseline SCr- 1.6, T2DM. Per chart review patient lives with spouse and daughter. Reportedly independent  prior to admission. Son is a cardiologist in [place]. Admitted on 02/24/18 with weakness, LUE weakness with heaviness and elevated BP. SBP>200. MRI/MRA brain done revealing clustered foci of acute/early subacute infarct within right frontoparietal junction with additional punctate infarct right insula and short segment right M2 division severe stenosis/near occlusion and tandem stenosis right A3. Carotid dopplers without evidence of significant ICA stenosis. 2D echo revealed EF 60-65% with no wall abnormality, severely calcified MV and grade 2 diastolic dysfunction. Dr. Leonie Man felt that stroke was embolic cue to unknown source --A fib suspected but patient declined TEE and loop recorder. To continue ASA/Plavix for 3 weeks followed by ASA alone   Now requiring 24/7 Rehab RN,MD, as well as CIR level PT, OT and SLP.  Treatment team will focus on ADLs and mobility with goals set at Sup  See Team Conference Notes for weekly updates to the plan of care

## 2018-03-02 NOTE — Care Management Note (Signed)
Inpatient Rehabilitation Center Individual Statement of Services  Patient Name:  [name]  Date:  03/02/2018  Welcome to the [facility].  Our goal is to provide you with an individualized program based on your diagnosis and situation, designed to meet your specific needs.  With this comprehensive rehabilitation program, you will be expected to participate in at least 3 hours of rehabilitation therapies Monday-Friday, with modified therapy programming on the weekends.  Your rehabilitation program will include the following services:  Physical Therapy (PT), Occupational Therapy (OT), 24 hour per day rehabilitation nursing, Therapeutic Recreaction (TR), Case Management (Social Worker), Rehabilitation Medicine, Nutrition Services and Pharmacy Services  Weekly team conferences will be held on Wednesday to discuss your progress.  Your Social Worker will talk with you frequently to get your input and to update you on team discussions.  Team conferences with you and your family in attendance may also be held.  Expected length of stay: 12-14 days  Overall anticipated outcome: supervision level with cues  Depending on your progress and recovery, your program may change. Your Social Worker will coordinate services and will keep you informed of any changes. Your Social Worker's name and contact numbers are listed  below.  The following services may also be recommended but are not provided by the [facility] will be made to provide these services after discharge if needed.  Arrangements include referral to agencies that provide these services.  Your insurance has been verified to be:  [employer]_medicare Your primary doctor is:  [name]  Pertinent information will be shared with your doctor and your insurance company.  Social Worker:  Ovidio Kin, [place] or ([phone]  Information discussed with and copy given to patient by: [name], 03/02/2018, 10:33 AM

## 2018-03-02 NOTE — Progress Notes (Signed)
Social Work  Social Work Assessment and Plan  Patient Details  Name: [name] MRN: [id number] Date of Birth: 03-05-1946  Today's Date: 03/02/2018  Problem List:  Patient Active Problem List   Diagnosis Date Noted  . Chronic kidney disease (CKD), stage III (moderate) (HCC)   . Hypertensive crisis   . Embolic stroke ([place]) 19/14/7829  . Left hemiparesis ([place])   . Diabetes mellitus type 2 in nonobese (HCC)   . Acute ischemic stroke ([place])   . Left arm weakness   . Hypertensive urgency 02/24/2018  . Acute CVA (cerebrovascular accident) ([place]) 02/24/2018  . 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 Weakness of left upper extremity 02/24/2018  . Overweight (BMI 25.0-29.9) 02/24/2018  . CKD (chronic kidney disease), stage III ([place]) 02/24/2018  . Diabetes mellitus without complication ([place]) 56/21/3086  . HTN (hypertension) 09/02/2017  . Fall at home, initial encounter 09/02/2017  . Knee pain, bilateral 09/02/2017  . Weakness 09/02/2017   Past Medical History:  Past Medical History:  Diagnosis Date  . Allergy   . Arthritis   . Cataract   . Diabetes mellitus without complication ([place])   . Hypertension   . Macular degeneration syndrome    with edema---being treated.    Past Surgical History:  Past Surgical History:  Procedure Laterality Date  . callous removal  Left 2017   located on 1st digit on L foot 2/2 diabetes  . EYE SURGERY     Social History:  reports that she has never smoked. She has never used smokeless tobacco. She reports that she does not drink alcohol or use drugs.  Family / Support Systems Marital Status: Married Patient Roles: Spouse, Parent Children: IT trainer [phone]   Other Supports: Daughter and son in-law in [place] Son in-law is a Paediatric nurse Caregiver: Husband and daughter Ability/Limitations of Caregiver: Daughter works during the day and husband has smoe limitations from transverse myelitis   Caregiver Availability: 24/7 Family Dynamics: Close knit family both daughter's are invovled and supprortive. Husband is able to do for himself and still drives. They have church members and friends who are involved and supportive. Pt feels she has good supports.  Social History Preferred language: English Religion: Unknown Cultural Background: No issues Education: Secretary/administrator educated Read: Yes Write: Yes Employment Status: Retired Date Retired/Disabled/Unemployed: Energy manager Issues: No issues Guardian/Conservator: None-according to MD pt is capable of making her own decisions while here, she does want her daughter involved.   Abuse/Neglect Abuse/Neglect Assessment Can Be Completed: Yes Physical Abuse: Denies Verbal Abuse: Denies Sexual Abuse: Denies Exploitation of patient/patient's resources: Denies Self-Neglect: Denies  Emotional Status Pt's affect, behavior adn adjustment status: Pt is motivated to do well and recover from this stroke. She has always been able to take care of herself and wants to again even if it is in a different way. She does not want to burden her husband if she can help it. he has done well with his recovery from trnasverse myelitis Recent Psychosocial Issues: Pt's other health issues were managed and she tries to be holisitc and as little medicaitons as possible. Pyschiatric History: No history deferre depression screen due to pt is able to voice her concerns and feelings regarding her stroke. She seems to be coping appropriatley with all of this. Will follow and ask team if would benefit from seeing neuro-psyuch while here. Substance Abuse History: No issues  Patient / Family Perceptions, Expectations & Goals Pt/Family understanding of  illness & functional limitations: Pt and daughter have a good understanding of her stroke and deficits. They are both concerned aobut her BP and getting it down and managable. Pt does not like  being on lisinopril and has expressed this to the MD. She is not one to not ask questions. Both read up on medical information and on the internet. Premorbid pt/family roles/activities: Wife, Mom, grandmother, retiree, church member, etc Anticipated changes in roles/activities/participation: resume Pt/family expectations/goals: Pt states: " I want to be able to take care of myself before I leave here. That is my goal."  Daughter states: " We will do whatever is needed but know what she wants for herself."  [employer]: None Premorbid Home Care/DME Agencies: None Transportation available at discharge: Husband and daughter Resource referrals recommended: Support group (specify)  Discharge Planning Living Arrangements: Spouse/significant other, Children Support Systems: Spouse/significant other, Children, Water engineer, Social worker community Type of Residence: Private residence [employer] Resources: Multimedia programmer (specify)(UHC-Medicare) Museum/gallery curator Resources: Fish farm manager, Other (Comment)(Pension) Financial Screen Referred: No Living Expenses: Own Money Management: Spouse, Patient Does the patient have any problems obtaining your medications?: No Home Management: All do the home managment Patient/Family Preliminary Plans: Return home with husband and daughter. Both can assist pt daughter does work during the day but is home in the evenings. Husband is able to assist if needed. Will work on discharge needs and provide support. Social Work Anticipated Follow Up Needs: HH/OP, Support Group  Clinical Impression Pleasant female who is motivated to do whatever she needs to do to improve and regain her independence from her stroke. She is concerned about her medication and getting her BP down and regulated. Both husband and daughter's are involved and will assist if needed. Work on her discharge needs.  [name] 03/02/2018, 10:52 AM

## 2018-03-02 NOTE — Progress Notes (Signed)
Physical Therapy Session Note  Patient Details  Name: [name] MRN: [id number] Date of Birth: [birth year]  Today's Date: 03/02/2018 PT Individual Time: 1104-1150 and 2248-2500 PT Individual Time Calculation (min): 46 min and 8 min   Short Term Goals: Week 1:  PT Short Term Goal 1 (Week 1): pt will roll R wiht 1 cues or less for LUE PT Short Term Goal 2 (Week 1): pt will move L sidelying> sit with min assist PT Short Term Goal 3 (Week 1): pt will transfer bed>< w/c to L and R with mod assist PT Short Term Goal 4 (Week 1): pt will propel w/c x 150 with supervsion PT Short Term Goal 5 (Week 1): pt will perform gait x 150' with multiple turns with min assist, LRAD  Skilled Therapeutic Interventions/Progress Updates:  Treatment 1: Pt received in recliner & agreeable to tx. No c/o pain reported, only reports of fatigue. BP assessed via dinamap then manually per RN with RN clearing pt for participation. Pt transferred recliner>w/c via stand pivot with min assist with cuing for sequencing. Pt propelled w/c room>gym with BLE & RUE with min assist with task focusing on BLE strengthening &  L NMR. Pt completes stand pivot w/c<>mat table with min assist and sit<>supine with min assist. While supine on mat table pt performed BLE bridging with adductor hold and LLE single leg bridging with cuing for technique & task focusing on LLE NMR & strengthening. Due to elevated BP back in w/c after mat exercises pt returned to room & RN notified. Pt required mod assist for sit>stand and min assist stand pivot to bed. Pt left in bed with alarm set & RN in room administering additional BP meds. Pt missed 14 minutes of skilled PT session 2/2 elevated BP.  Sitting, RUE, dinamap: BP = 196/85 mmHg, HR = 74 bpm Sitting, RUE, manually per RN: BP = 142/96 mmHg Sitting in w/c after mat exercises, RUE, dinamap: BP = 194/66 mmHg, HR = 74 bpm & RN made aware  Pt with poor eccentric control with all stand>sit transfers. Pt  declined any adverse symptoms during session (denied HA), only reported fatigue.   Treatment 2: Pt received in bed reporting 8/10 fatigue. BP assessed: 189/62 mmHg (RUE), HR = 78 bpm. RN made aware & reports pt has had all scheduled & PRN BP meds today. Educated pt on ability to perform some bed level exercises with pt reporting she'd like to rest. Pt missed 67 minutes of tx 2/2 elevated BP & fatigue. Will consult PA for BP parameters.     Therapy Documentation Precautions:  Precautions Precautions: Fall Precaution Comments: Lt hemi Restrictions Weight Bearing Restrictions: No   General: PT Amount of Missed Time (min): 14 minutes + 67 Minutes PT Missed Treatment Reason: (elevated BP & fatigue)     See Function Navigator for Current Functional Status.   Therapy/Group: Individual Therapy  [name] 03/02/2018, 3:19 PM

## 2018-03-02 NOTE — Progress Notes (Signed)
Patient information reviewed and entered into eRehab system by Eberardo Demello, RN, CRRN, PPS Coordinator.  Information including medical coding and functional independence measure will be reviewed and updated through discharge.     Per nursing patient was given "Data Collection Information Summary for Patients in Inpatient Rehabilitation Facilities with attached "Privacy Act Statement-Health Care Records" upon admission.  

## 2018-03-03 ENCOUNTER — Inpatient Hospital Stay (HOSPITAL_COMMUNITY): Payer: Medicare Other | Admitting: Physical Therapy

## 2018-03-03 ENCOUNTER — Inpatient Hospital Stay (HOSPITAL_COMMUNITY): Payer: Medicare Other | Admitting: Occupational Therapy

## 2018-03-03 LAB — GLUCOSE, CAPILLARY
GLUCOSE-CAPILLARY: 144 mg/dL — AB (ref 70–99)
Glucose-Capillary: 142 mg/dL — ABNORMAL HIGH (ref 70–99)
Glucose-Capillary: 129 mg/dL — ABNORMAL HIGH (ref 70–99)
Glucose-Capillary: 134 mg/dL — ABNORMAL HIGH (ref 70–99)

## 2018-03-03 NOTE — Progress Notes (Signed)
Occupational Therapy Session Note  Patient Details  Name: [name] MRN: [id number] Date of Birth: 11-27-1945  Today's Date: 03/03/2018 OT Individual Time: 1000-1100 and 1305-1420 OT Individual Time Calculation (min): 60 min and 75 min   Short Term Goals: Week 1:  OT Short Term Goal 1 (Week 1): Pt will complete 1/3 components of donning pants OT Short Term Goal 2 (Week 1): Pt will complete toilet transfer with Min A and LRAD OT Short Term Goal 3 (Week 1): Pt will scan to Lt for ADL items with min vcs during 2 consecutive ADL sessions   Skilled Therapeutic Interventions/Progress Updates:    1) Treatment session with focus on functional transfers and LUE NMR and Lt attention.  Pt received supine in bed.  Completed bed mobility with mod assist and mod assist stand pivot transfer bed > w/c > toilet.  Pt demonstrated ability to weight shift to pull pants down in standing prior to toileting, however still required assist to pull pants up post toileting.  Engaged in [place] in sitting with towel glides on table.  Provided pt with visual target to improve mobility and decrease use of compensatory strategies.  Incorporated knocking over pegs with LUE to focus on motor control during towel glides and increased attention to Lt visual field.  Provided pt with resistive foam block to focus on grasp with pt requiring increased time and multiple attempts but eventually able to grasp block and bring it to Lt.  Pt returned to room and left upright in w/c with chair alarm on and all needs in reach.  2) Treatment session with focus on motor control during transfers and sit <> stand as well as Lt NMR.  Pt received seated upright in recliner reporting need to toilet.  Completed squat pivot transfers recliner > w/c> toilet with mod assist for sit > stand and min assist during transfer.  Pt able to complete clothing management up and down during toileting task this session with min assist for standing balance.   Engaged in blocked practice of sit <> stand in therapy gym with focus on weight shift and motor control to not "plop" back down when sitting.  Engaged in reaching activity in standing with focus on visual scanning to Lt and crossing midline when reaching to Lt.  Min assist during standing balance with tactile cues at Lt knee for stability and when weight shifting.  Engaged in reaching with LUE for items on table and then sliding them to opposite corners, with pt demonstrating increased difficulty when reaching away from body.  Engaged in bimanual task with ball with focus on symmetrical and reciprocal movement with chest presses with ball and then rotation, therapist providing hand over hand to facilitate symmetrical movements.  Returned to room and transferred back to bed and left in supine with all needs in reach.  Therapy Documentation Precautions:  Precautions Precautions: Fall Precaution Comments: Lt hemi Restrictions Weight Bearing Restrictions: No General:   Vital Signs: Therapy Vitals Pulse Rate: 71 BP: (!) 171/71 Patient Position (if appropriate): Sitting Pain:  Pt with no c/o pain  See Function Navigator for Current Functional Status.   Therapy/Group: Individual Therapy  [name] 03/03/2018, 12:29 PM

## 2018-03-03 NOTE — Progress Notes (Signed)
[facility] PHYSICAL MEDICINE & REHABILITATION     PROGRESS NOTE  Subjective/Complaints:  Dicussed blood work with daughter  ROS: denies CP, SOB, N/V/D  Objective: Vital Signs: Blood pressure (!) 175/66, pulse 84, temperature 98.8 F (37.1 C), temperature source Oral, resp. rate 18, height 5' 3.25" (1.607 m), weight 68.9 kg, SpO2 95 %. No results found. Recent Labs    03/02/18 0545  WBC 8.2  HGB 10.9*  HCT 33.7*  PLT 285   Recent Labs    03/02/18 0545  NA 139  K 4.0  CL 115*  GLUCOSE 145*  BUN 36*  CREATININE 1.71*  CALCIUM 8.6*   CBG (last 3)  Recent Labs    03/02/18 1648 03/02/18 2105 03/03/18 0632  GLUCAP 119* 164* 142*    Wt Readings from Last 3 Encounters:  02/27/18 68.9 kg  02/24/18 71.3 kg  02/24/18 71.4 kg    Physical Exam:  BP (!) 175/66 (BP Location: Right Arm)   Pulse 84   Temp 98.8 F (37.1 C) (Oral)   Resp 18   Ht 5' 3.25" (1.607 m)   Wt 68.9 kg   SpO2 95%   BMI 26.70 kg/m  Constitutional: NAD. She appears well-developed and well-nourished HENT: Normocephalic. Atraumatic. Eyes: EOMI. No discharge. Cardiovascular: RRR. No JVD.  Respiratory: Effort normal and breath sounds normal.  GI: She exhibits no distension.  Musculoskeletal: No edema or tenderness in extremities Neurological: She is alert and oriented Motor: LUE 3/5 prox to distal (stable) LLE 4/5 prox to distal (stable) RUE/RLE 5/5 proximal to distal.  Skin: Skin is warm. Intact. Psychiatric: She has a normal mood and affect.   Assessment/Plan: 1. 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 Physiatrist is providing close team supervision and 24 hour management of active medical problems listed below. Physiatrist and rehab team continue to assess barriers to discharge/monitor patient progress toward functional and  medical goals.  Function:  Bathing Bathing position   Position: Shower  Bathing parts Body parts bathed by patient: Chest, Abdomen, Front perineal area, Right upper leg, Left upper leg, Left arm, Buttocks, Right lower leg, Left lower leg Body parts bathed by helper: Right arm, Back  Bathing assist Assist Level: Touching or steadying assistance(Pt > 75%)      Upper Body Dressing/Undressing Upper body dressing   What is the patient wearing?: Pull over shirt/dress     Pull over shirt/dress - Perfomed by patient: Thread/unthread right sleeve, Put head through opening Pull over shirt/dress - Perfomed by helper: Thread/unthread left sleeve, Pull shirt over trunk        Upper body assist Assist Level: (Mod assist)      Lower Body Dressing/Undressing Lower body dressing   What is the patient wearing?: Underwear, Pants, Non-skid slipper socks Underwear - Performed by patient: Thread/unthread left underwear leg Underwear - Performed by helper: Thread/unthread right underwear leg, Pull underwear up/down Pants- Performed by patient: Thread/unthread left pants leg Pants- Performed by helper: Thread/unthread right pants leg, Pull pants up/down Non-skid slipper socks- Performed by patient: Don/doff right sock Non-skid slipper socks- Performed by helper: Don/doff right sock, Don/doff left sock     Shoes - Performed by patient: Don/doff right shoe Shoes - Performed by helper: Don/doff left shoe          Lower body assist Assist for lower body dressing: (Max assist)  Toileting Toileting   Toileting steps completed by patient: Adjust clothing prior to toileting, Performs perineal hygiene Toileting steps completed by helper: Adjust clothing after toileting    Toileting assist Assist level: Touching or steadying assistance (Pt.75%)   Transfers Chair/bed transfer   Chair/bed transfer method: Stand pivot Chair/bed transfer assist level: Touching or steadying assistance (Pt >  75%) Chair/bed transfer assistive device: Armrests     Locomotion Ambulation     Max distance: 50 Assist level: Moderate assist (Pt 50 - 74%)   Wheelchair   Type: Manual Max wheelchair distance: 100 ft(BLE & RUE) Assist Level: Touching or steadying assistance (Pt > 75%)  Cognition Comprehension Comprehension assist level: Follows basic conversation/direction with no assist  Expression Expression assist level: Expresses complex 90% of the time/cues < 10% of the time  Social Interaction Social Interaction assist level: Interacts appropriately with others with medication or extra time (anti-anxiety, antidepressant).  Problem Solving Problem solving assist level: Solves basic 75 - 89% of the time/requires cueing 10 - 24% of the time  Memory Memory assist level: Recognizes or recalls 90% of the time/requires cueing < 10% of the time     Medical Problem List and Plan:  1. 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 DVT Prophylaxis/Anticoagulation: SCDs  3. Pain Management: N/A  4. Mood: LCSW to follow for evaluation and support.  5. Neuropsych: This patient is capable of making decisions on his own behalf.  6. Skin/Wound Care: routine pressure relief measures.  7. Fluids/Electrolytes/Nutrition: Maintain adequate nutritional and hydration status. I 566ml 8. HTN: Difficult to control PTA.   Lisinopril increased 7.5 mg and 8/25- mild cough, ? Related to ACE, pt states she had a cough "all summer" , consider ARB- no current c/os  Hydralazine 10 mg every 8 when necessary   Vitals:   03/03/18 0607 03/03/18 0827  BP: 138/62 (!) 175/66  Pulse: 84   Resp: 18   Temp: 98.8 F (37.1 C)   SpO2: 95%   remains labile-monitor for pattern 9. T2DM: Hgb A1C- 7.3. Check blood sugars before meals and at bedtime.   Patient had been on Glucophage 500 mg twice daily prior to  admission   Monitor with increased mobility   CBG (last 3)  Recent Labs    03/02/18 1648 03/02/18 2105 03/03/18 0632  GLUCAP 119* 164* 142*  controlled 8/27 10. CKD: Baseline SCr 1.6. Encourage fluid intake. Monitor renal status with serial checks for stability.   Creatinine 1.68 on 8/20- stable vs ~38mo ago  BUN mildly elevated 11.  Hgb 10.9gm- baseline ~12 will check stool OB, no hematochezia  LOS (Days) 4 A FACE TO FACE EVALUATION WAS PERFORMED  Charlett Blake 03/03/2018 8:38 AM

## 2018-03-03 NOTE — Evaluation (Signed)
Recreational Therapy Assessment and Plan  Patient Details  Name: [name] MRN: [id number] Date of Birth: 1946/03/18 Today's Date: 03/03/2018  Rehab Potential: Good ELOS: 2 weeks   Assessment Problem List:      Patient Active Problem List   Diagnosis Date Noted  . Chronic kidney disease (CKD), stage III (moderate) (HCC)   . Hypertensive crisis   . Embolic stroke ([place]) 32/35/5732  . Left hemiparesis ([place])   . Diabetes mellitus type 2 in nonobese (HCC)   . Acute ischemic stroke ([place])   . Left arm weakness   . Hypertensive urgency 02/24/2018  . Acute CVA (cerebrovascular accident) ([place]) 02/24/2018  . 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 Weakness of left upper extremity 02/24/2018  . Overweight (BMI 25.0-29.9) 02/24/2018  . CKD (chronic kidney disease), stage III ([place]) 02/24/2018  . Diabetes mellitus without complication ([place]) 20/25/4270  . HTN (hypertension) 09/02/2017  . Fall at home, initial encounter 09/02/2017  . Knee pain, bilateral 09/02/2017  . Weakness 09/02/2017    Past Medical History:      Past Medical History:  Diagnosis Date  . Allergy   . Arthritis   . Cataract   . Diabetes mellitus without complication ([place])   . Hypertension   . Macular degeneration syndrome    with edema---being treated.    Past Surgical History:       Past Surgical History:  Procedure Laterality Date  . callous removal  Left 2017   located on 1st digit on L foot 2/2 diabetes  . EYE SURGERY      Assessment & Plan Clinical Impression:[name] is a45 year old RH female with history of HTN, CKD--baseline SCr- 1.6, T2DM. Per chart review patient lives with spouse and daughter. Reportedly independent prior to admission. Son is a cardiologist in [place]. Admitted on 02/24/18 with weakness, LUE weakness with heaviness and elevated BP. SBP>200. MRI/MRA brain done revealing clustered foci of acute/early subacute infarct within  right frontoparietal junction with additional punctate infarct right insula and short segment right M2 division severe stenosis/near occlusion and tandem stenosis right A3. Carotid dopplers without evidence of significant ICA stenosis. 2D echo revealed EF 60-65% with no wall abnormality, severely calcified MV and grade 2 diastolic dysfunction. Dr. Leonie Man felt that stroke was embolic cue to unknown source --A fib suspected but patient declined TEE and loop recorder. To continue ASA/Plavix for 3 weeks followed by ASA alone.  Patient transferred to CIR on 02/27/2018 .   Pt presents with decreased activity tolerance, decreased functional mobility, decreased balance, decreased coordination Limiting pt's independence with leisure/community pursuits.  Plan Min 1 TR session >20 minutes during LOS  Recommendations for other services: None   Discharge Criteria: Patient will be discharged from TR if patient refuses treatment 3 consecutive times without medical reason.  If treatment goals not met, if there is a change in medical status, if patient makes no progress towards goals or if patient is discharged from hospital.  The above assessment, treatment plan, treatment alternatives and goals were discussed and mutually agreed upon: by patient  [place] 03/03/2018, 4:13 PM

## 2018-03-03 NOTE — Progress Notes (Signed)
Physical Therapy Session Note  Patient Details  Name: [name] MRN: [id number] Date of Birth: 09-18-1945  Today's Date: 03/03/2018 PT Individual Time: 0807-0902 PT Individual Time Calculation (min): 55 min   Short Term Goals: Week 1:  PT Short Term Goal 1 (Week 1): pt will roll R wiht 1 cues or less for LUE PT Short Term Goal 2 (Week 1): pt will move L sidelying> sit with min assist PT Short Term Goal 3 (Week 1): pt will transfer bed>< w/c to L and R with mod assist PT Short Term Goal 4 (Week 1): pt will propel w/c x 150 with supervsion PT Short Term Goal 5 (Week 1): pt will perform gait x 150' with multiple turns with min assist, LRAD  Skilled Therapeutic Interventions/Progress Updates:  Pt received on [facility] receiving assistance from NT & daughter. Pt agreeable to tx, denying c/o pain. BP assessed: 175/66 mmHg (RUE, sitting). Pt transferred recliner>w/c with cuing for hand placement for sit>stand and poor eccentric control with stand>sit. Transported pt to gym via w/c total assist for time management. Gait x 50 ft + 18 ft without AD & min assist with cuing for increased heel strike LLE with task focusing on dynamic balance, NMR, and gait. Pt did experience BLE weakness requiring max assist to safely sit. Pt performed standing step (3") taps without AD with mod assist for balance with task focusing on BLE coordination, L NMR, weight shifting, and dynamic balance with rest breaks taken PRN. Pt performed 2 sets of 5x sit<>stand without BUE support with task focusing on equal weight bearing BLE, anterior/L weight shifting, upright posture, eccentric control and BLE strengthening with min assist but max cuing from therapist. Pt performed 6" step ups with RUE support and mod progressing to min assist with task focusing on BLE strengthening. At end of session pt assisted back to bed with min assist for sit>supine and cuing for BLE placement to scoot to [facility]. Pt left in bed with alarm set & RN in room.  Therapist donned L hand splint total assist.    Therapy Documentation Precautions:  Precautions Precautions: Fall Precaution Comments: Lt hemi Restrictions Weight Bearing Restrictions: No   See Function Navigator for Current Functional Status.   Therapy/Group: Individual Therapy  [name] 03/03/2018, 9:05 AM

## 2018-03-03 NOTE — Progress Notes (Signed)
Physical Therapy Session Note  Patient Details  Name: [name] MRN: [id number] Date of Birth: [birth year]  Today's Date: 03/03/2018 PT Individual Time: 2353-6144 PT Individual Time Calculation (min): 38 min   Short Term Goals: Week 1:  PT Short Term Goal 1 (Week 1): pt will roll R wiht 1 cues or less for LUE PT Short Term Goal 2 (Week 1): pt will move L sidelying> sit with min assist PT Short Term Goal 3 (Week 1): pt will transfer bed>< w/c to L and R with mod assist PT Short Term Goal 4 (Week 1): pt will propel w/c x 150 with supervsion PT Short Term Goal 5 (Week 1): pt will perform gait x 150' with multiple turns with min assist, LRAD  Skilled Therapeutic Interventions/Progress Updates:  Pt received in w/c & agreeable to tx. Pt seen to make up missed time for yesterday. No c/o pain & BP assessed, see below. Transported pt to/from gym via w/c total assist for time management. Pt completes stand pivot transfers with min assist overall with cuing for hand placement and overall sequencing. Pt completed sit<>stands with RLE elevated on step with task focusing on forced use of LLE for NMR & strengthening, as well as upright posture and eccentric control with stand>sit with pt requiring extra time to complete task. Pt utilized cybex kinetron in sitting with task focusing on LLE NMR & strengthening with rest breaks taken PRN. At end of session pt left sitting in recliner in room with chair alarm donned & daughter present to supervise.   Therapy Documentation Precautions:  Precautions Precautions: Fall Precaution Comments: Lt hemi Restrictions Weight Bearing Restrictions: No  Vital Signs: Sitting in w/c at beginning of session: BP = 171/71 mmHg (RUE), HR = 76 bpm   See Function Navigator for Current Functional Status.   Therapy/Group: Individual Therapy  [name] 03/03/2018, 12:06 PM

## 2018-03-04 ENCOUNTER — Inpatient Hospital Stay (HOSPITAL_COMMUNITY): Payer: Medicare Other | Admitting: Physical Therapy

## 2018-03-04 ENCOUNTER — Inpatient Hospital Stay (HOSPITAL_COMMUNITY): Payer: Medicare Other | Admitting: Occupational Therapy

## 2018-03-04 LAB — BASIC METABOLIC PANEL
ANION GAP: 7 (ref 5–15)
BUN: 42 mg/dL — ABNORMAL HIGH (ref 8–23)
CHLORIDE: 114 mmol/L — AB (ref 98–111)
CO2: 18 mmol/L — ABNORMAL LOW (ref 22–32)
Calcium: 8.9 mg/dL (ref 8.9–10.3)
Creatinine, Ser: 1.77 mg/dL — ABNORMAL HIGH (ref 0.44–1.00)
GFR calc non Af Amer: 28 mL/min — ABNORMAL LOW (ref >60)
GFR, EST AFRICAN AMERICAN: 32 mL/min — AB (ref >60)
Glucose, Bld: 148 mg/dL — ABNORMAL HIGH (ref 70–99)
POTASSIUM: 4 mmol/L (ref 3.5–5.1)
Sodium: 139 mmol/L (ref 135–145)

## 2018-03-04 LAB — GLUCOSE, CAPILLARY
GLUCOSE-CAPILLARY: 134 mg/dL — AB (ref 70–99)
GLUCOSE-CAPILLARY: 171 mg/dL — AB (ref 70–99)
Glucose-Capillary: 132 mg/dL — ABNORMAL HIGH (ref 70–99)
Glucose-Capillary: 159 mg/dL — ABNORMAL HIGH (ref 70–99)

## 2018-03-04 LAB — CBC
HCT: 33.8 % — ABNORMAL LOW (ref 36.0–46.0)
Hemoglobin: 11.2 g/dL — ABNORMAL LOW (ref 12.0–15.0)
MCH: 28.1 pg (ref 26.0–34.0)
MCHC: 33.1 g/dL (ref 30.0–36.0)
MCV: 84.9 fL (ref 78.0–100.0)
PLATELETS: 263 10*3/uL (ref 150–400)
RBC: 3.98 MIL/uL (ref 3.87–5.11)
RDW: 13.2 % (ref 11.5–15.5)
WBC: 7.3 10*3/uL (ref 4.0–10.5)

## 2018-03-04 MED ORDER — SODIUM CHLORIDE 0.45 % IV SOLN
INTRAVENOUS | Status: DC
  Administered 2018-03-04 – 2018-03-07 (×4): via INTRAVENOUS

## 2018-03-04 MED ORDER — AMLODIPINE BESYLATE 2.5 MG PO TABS
2.5000 mg | ORAL_TABLET | Freq: Every day | ORAL | Status: DC
  Administered 2018-03-04 – 2018-03-08 (×5): 2.5 mg via ORAL
  Filled 2018-03-04 (×5): qty 1

## 2018-03-04 NOTE — Progress Notes (Signed)
[name] PHYSICAL MEDICINE & REHABILITATION     PROGRESS NOTE  Subjective/Complaints:   Discuyssed med management with pt's daughter specifically fluids and BP and renal fxn  ROS: denies CP, SOB, N/V/D  Objective: Vital Signs: Blood pressure (!) 160/61, pulse 75, temperature 98.2 F (36.8 C), temperature source Oral, resp. rate 18, height 5' 3.25" (1.607 m), weight 69 kg, SpO2 94 %. No results found. Recent Labs    03/02/18 0545 03/04/18 0509  WBC 8.2 7.3  HGB 10.9* 11.2*  HCT 33.7* 33.8*  PLT 285 263   Recent Labs    03/02/18 0545 03/04/18 0509  NA 139 139  K 4.0 4.0  CL 115* 114*  GLUCOSE 145* 148*  BUN 36* 42*  CREATININE 1.71* 1.77*  CALCIUM 8.6* 8.9   CBG (last 3)  Recent Labs    03/03/18 1636 03/03/18 2127 03/04/18 0627  GLUCAP 134* 144* 132*    Wt Readings from Last 3 Encounters:  03/04/18 69 kg  02/24/18 71.3 kg  02/24/18 71.4 kg    Physical Exam:  BP (!) 160/61   Pulse 75   Temp 98.2 F (36.8 C) (Oral)   Resp 18   Ht 5' 3.25" (1.607 m)   Wt 69 kg   SpO2 94%   BMI 26.73 kg/m  Constitutional: NAD. She appears well-developed and well-nourished HENT: Normocephalic. Atraumatic. Eyes: EOMI. No discharge. Cardiovascular: RRR. No JVD.  Respiratory: Effort normal and breath sounds normal.  GI: She exhibits no distension.  Musculoskeletal: No edema or tenderness in extremities Neurological: She is alert and oriented Motor: LUE 3/5 prox to distal (stable) LLE 4/5 prox to distal (stable) RUE/RLE 5/5 proximal to distal.  Skin: Skin is warm. Intact. Psychiatric: She has a normal mood and affect.   Assessment/Plan: 1. 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 Physiatrist is providing close team supervision and 24 hour management of active medical problems listed below. Physiatrist  and rehab team continue to assess barriers to discharge/monitor patient progress toward functional and medical goals.  Function:  Bathing Bathing position   Position: Shower  Bathing parts Body parts bathed by patient: Chest, Abdomen, Front perineal area, Right upper leg, Left upper leg, Left arm, Buttocks, Right lower leg, Left lower leg Body parts bathed by helper: Right arm, Back  Bathing assist Assist Level: Touching or steadying assistance(Pt > 75%)      Upper Body Dressing/Undressing Upper body dressing   What is the patient wearing?: Pull over shirt/dress     Pull over shirt/dress - Perfomed by patient: Thread/unthread right sleeve, Put head through opening Pull over shirt/dress - Perfomed by helper: Thread/unthread left sleeve, Pull shirt over trunk        Upper body assist Assist Level: (Mod assist)      Lower Body Dressing/Undressing Lower body dressing   What is the patient wearing?: Underwear, Pants, Non-skid slipper socks Underwear - Performed by patient: Thread/unthread left underwear leg Underwear - Performed by helper: Thread/unthread right underwear leg, Pull underwear up/down Pants- Performed by patient: Thread/unthread left pants leg Pants- Performed by helper: Thread/unthread right pants leg, Pull pants up/down Non-skid slipper socks- Performed by patient: Don/doff right sock Non-skid slipper socks- Performed by helper: Don/doff right sock, Don/doff left sock     Shoes - Performed by patient: Don/doff right shoe Shoes - Performed by helper: Don/doff left shoe  Lower body assist Assist for lower body dressing: (Max assist)      Toileting Toileting   Toileting steps completed by patient: Adjust clothing prior to toileting, Performs perineal hygiene Toileting steps completed by helper: Adjust clothing after toileting    Toileting assist Assist level: Touching or steadying assistance (Pt.75%)   Transfers Chair/bed transfer   Chair/bed  transfer method: Stand pivot Chair/bed transfer assist level: Touching or steadying assistance (Pt > 75%) Chair/bed transfer assistive device: Armrests     Locomotion Ambulation     Max distance: 50 ft  Assist level: Touching or steadying assistance (Pt > 75%)   Wheelchair   Type: Manual Max wheelchair distance: 100 ft(BLE & RUE) Assist Level: Touching or steadying assistance (Pt > 75%)  Cognition Comprehension Comprehension assist level: Follows basic conversation/direction with no assist  Expression Expression assist level: Expresses complex 90% of the time/cues < 10% of the time  Social Interaction Social Interaction assist level: Interacts appropriately with others with medication or extra time (anti-anxiety, antidepressant).  Problem Solving Problem solving assist level: Solves basic 75 - 89% of the time/requires cueing 10 - 24% of the time  Memory Memory assist level: Recognizes or recalls 90% of the time/requires cueing < 10% of the time     Medical Problem List and Plan:  1. 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 DVT Prophylaxis/Anticoagulation: SCDs  3. Pain Management: N/A  4. Mood: LCSW to follow for evaluation and support.  5. Neuropsych: This patient is capable of making decisions on his own behalf.  6. Skin/Wound Care: routine pressure relief measures.  7. Fluids/Electrolytes/Nutrition: Maintain adequate nutritional and hydration status. I 718ml- will run IVF at noc 8. HTN: Difficult to control PTA.   Lisinopril increased 7.5 mg and 8/25- mild cough, ? Related to ACE, pt states she had a cough "all summer" , consider ARB- no current c/os  Hydralazine 10 mg every 8 when necessary   Vitals:   03/04/18 0418 03/04/18 0806  BP: (!) 142/63 (!) 160/61  Pulse: 75   Resp: 18   Temp: 98.2 F (36.8 C)   SpO2: 94%   remains labile-monitor for  pattern- given CKD amlodipine may be better agent will check PCP documentation to see if this has been tried as OP Per Dr Deborra Medina , pt stopped amlodipine and lisinopril at home, allergy to clonidine Will start amlodipine in am and either stop or reduce stop ACE  9. T2DM: Hgb A1C- 7.3. Check blood sugars before meals and at bedtime.   Patient had been on Glucophage 500 mg twice daily prior to admission   Monitor with increased mobility   CBG (last 3)  Recent Labs    03/03/18 1636 03/03/18 2127 03/04/18 0627  GLUCAP 134* 144* 132*  controlled 8/28 10. CKD: Baseline SCr 1.6. Encourage fluid intake. Monitor renal status with serial checks for stability.   Creatinine 1.68 on 8/20- stable vs ~56mo ago  BUN moderately elevated 11.  Hgb 10.9gm- baseline ~12 will check stool OB, no hematochezia  LOS (Days) 5 A FACE TO FACE EVALUATION WAS PERFORMED  Charlett Blake 03/04/2018 8:21 AM

## 2018-03-04 NOTE — Progress Notes (Signed)
Social Work Patient ID: [name], female   DOB: [birth year], 72 y.o.   MRN: [id number]  Met with pt and spoke with daughter via telephone to discuss team conference goals supervision level and target discharge 9/11. Both feel she is improving and pleased with this. Aware of the medical issues MD is addressing her BUN level and to encourage fluids along with IV fluids tonight. Pt is tired after therapies. But also is not feeling well have informed RN of this and she will check pt's BP. Will work toward discharge needs. Will ask neuro-psych to see while here.

## 2018-03-04 NOTE — Progress Notes (Signed)
Occupational Therapy Session Note  Patient Details  Name: [name] MRN: [id number] Date of Birth: Nov 29, 1945  Today's Date: 03/04/2018 OT Individual Time: 1004-1030 and 1107-1201 OT Individual Time Calculation (min): 26 min and 54 min   Short Term Goals: Week 1:  OT Short Term Goal 1 (Week 1): Pt will complete 1/3 components of donning pants OT Short Term Goal 2 (Week 1): Pt will complete toilet transfer with Min A and LRAD OT Short Term Goal 3 (Week 1): Pt will scan to Lt for ADL items with min vcs during 2 consecutive ADL sessions   Skilled Therapeutic Interventions/Progress Updates:    1)Treatment session with focus on LUE NMR.  Engaged in [place] with focus on elbow extension, wrist flexion/extension, and finger flexion/extension with grasp.  Pt demonstrating increased grasp this session with ability to close fingers over large peg.  Challenged pt to complete pronation/supination to then complete finger extension.  Pt with no active extension this session.  Max cues to visually attend to LUE during reaching and grasping activity to promote increased motor control.  2) Treatment session with focus on motor control with sit <> stand, stand pivot transfers, and LUE NMR.  Pt reports need to toilet.  Completed stand pivot transfer to toilet with mod assist for sit > stand and min assist for transfer and for trunk control while completing clothing management.  Engaged in [place] in supine and sidelying with focus on motor control with movements as pt has tendency to over compensate with shoulder elevation and trunk movements.  Frequent cues provided for pt to visually attend to LUE during movements with noted improvements in quality of movement when visually attending to LUE.  Therapy Documentation Precautions:  Precautions Precautions: Fall Precaution Comments: Lt hemi Restrictions Weight Bearing Restrictions: No Pain: Pain Assessment Pain Scale: 0-10 Pain Score: 0-No pain  See  Function Navigator for Current Functional Status.   Therapy/Group: Individual Therapy  [name] 03/04/2018, 12:54 PM

## 2018-03-04 NOTE — Progress Notes (Signed)
Physical Therapy Session Note  Patient Details  Name: [name] MRN: [id number] Date of Birth: 1945/09/29  Today's Date: 03/04/2018 PT Individual Time: 0806-0916 PT Individual Time Calculation (min): 70 min   Short Term Goals: Week 1:  PT Short Term Goal 1 (Week 1): pt will roll R wiht 1 cues or less for LUE PT Short Term Goal 2 (Week 1): pt will move L sidelying> sit with min assist PT Short Term Goal 3 (Week 1): pt will transfer bed>< w/c to L and R with mod assist PT Short Term Goal 4 (Week 1): pt will propel w/c x 150 with supervsion PT Short Term Goal 5 (Week 1): pt will perform gait x 150' with multiple turns with min assist, LRAD  Skilled Therapeutic Interventions/Progress Updates:  Pt received in recliner & agreeable to tx. No c/o pain reported. RN present in room administering meds. Pt completes sit>stand with cuing for technique and to scoot out to edge of seat and mod assist overall. Therapist provides cuing for hand placement and sequencing for stand pivot transfers. Transported pt to gym via w/c total assist for time management. Pt ambulates 40 ft + 40 ft without AD with min assist with cuing for sequencing and increased step length & heel strike LLE, with increased weight shifting R. Pt stood on compliant surface without BUE support with min/mod assist and cuing for increased weight bearing RLE with task focusing on standing balance while pt engaged in connect four game. Pt utilized cybex kinetron in standing with BUE>LUE support and min assist with cuing for upright posture and task focusing on dynamic balance, weight shifting L<>R, & L NMR & strengthening. Pt reports she can ride home in either [place] or [place] with therapist recommending lower sedan. Pt completed stand pivot car transfer with mod/max assist for sit>stand from w/c and min assist for pivot with cuing for hand placement & technique. Pt propels w/c with BLE & supervision with task focusing on  coordination of BLE & LLE NMR. At end of session pt left sitting in recliner with chair alarm donned & all needs in reach.  Pt does require frequent seated rest breaks 2/2 BLE fatigue. Pt completes sit>stand from elevated mat with min assist but requires up to max assist when transferring from hemi height w/c.  Therapy Documentation Precautions:  Precautions Precautions: Fall Precaution Comments: Lt hemi Restrictions Weight Bearing Restrictions: No    Vital Signs: Therapy Vitals BP: (!) 160/61   See Function Navigator for Current Functional Status.   Therapy/Group: Individual Therapy  [name] 03/04/2018, 9:37 AM

## 2018-03-04 NOTE — Patient Care Conference (Signed)
Inpatient RehabilitationTeam Conference and Plan of Care Update Date: 03/04/2018   Time: 10:50 AM    Patient Name: [name]      Medical Record Number: [id number]  Date of Birth: Mar 06, 1946 Sex: Female         Room/Bed: 4W24C/4W24C-01 Payor Info: Payor: [employer] MEDICARE / Plan: [facility] MEDICARE / Product Type: *No Product type* /    Admitting Diagnosis: cva  Admit Date/Time:  02/27/2018 12:24 PM Admission Comments: No comment available   Primary Diagnosis:  <principal problem not specified> Principal Problem: <principal problem not specified>  Patient Active Problem List   Diagnosis Date Noted  . Chronic kidney disease (CKD), stage III (moderate) (HCC)   . Hypertensive crisis   . Embolic stroke ([place]) 16/96/7893  . Left hemiparesis ([place])   . Diabetes mellitus type 2 in nonobese (HCC)   . Acute ischemic stroke ([place])   . Left arm weakness   . Hypertensive urgency 02/24/2018  . Acute CVA (cerebrovascular accident) ([place]) 02/24/2018  . 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 Weakness of left upper extremity 02/24/2018  . Overweight (BMI 25.0-29.9) 02/24/2018  . CKD (chronic kidney disease), stage III ([place]) 02/24/2018  . Diabetes mellitus without complication ([place]) 81/07/7508  . HTN (hypertension) 09/02/2017  . Fall at home, initial encounter 09/02/2017  . Knee pain, bilateral 09/02/2017  . Weakness 09/02/2017    Expected Discharge Date: Expected Discharge Date: 03/18/18  Team Members Present: Physician leading conference: Dr. Alysia Penna Social Worker Present: Ovidio Kin, LCSW Nurse Present: Other (comment)(Latoya Foote-LPN) PT Present: Lavone Nian, PT OT Present: [name], OT SLP Present: Charolett Bumpers, SLP PPS Coordinator present : Daiva Nakayama, RN, CRRN     Current Status/Progress Goal Weekly Team Focus  Medical   blood pressure issues, family concerned about BP med dose  reduce BUN  adjust BP meds   Bowel/Bladder   Continent of bowel and bladder; lbm 03/03/18  Remain continent of bowel and bladder  assess bowel and bladder needs q shift and PRN and assist as needed   Swallow/Nutrition/ Hydration             ADL's   min assist bathing, mod assist UB dressing, max assist LB dressing, min-mod assist transfers and sit <> stand  supervision  ADL retraining, transfers, balance, Lt NMR, Lt attention   Mobility   min assist gait without AD for short distances, min/mod assist for transfer  supervision overall with LRAD  L NMR, transfers, gait, balance, pt education, stair negotiation   Communication             Safety/Cognition/ Behavioral Observations            Pain   No complaints of pain  Pain </= 3  Assess pain q shift and PRN and treat as necessary   Skin   Small skin tear on left forearm, covered with foam; no other skin issues   Remain free of infection and breakdown  Assess skin q shift and PRN       *See Care Plan and progress notes for long and short-term goals.     Barriers to Discharge  Current Status/Progress Possible Resolutions Date Resolved   Physician    Medical stability     progressing toward goals  Cont rehab, med management      Nursing                  PT  OT                  SLP                SW                Discharge Planning/Teaching Needs:  Home with husband and daughter, daughter works during the day. Husband can be there but can not physically assist her at discharge.      Team Discussion:  Goals supervision level, currently min/mod level. BP still on the high side and MD is adjusting her emds and pushing fluids will give IV fluids tonight to see if BUN will decrease. Poor endurance and therapists are working on this. L-inattention is getting better and compensating more.  Revisions to Treatment Plan:  DC 9/11    Continued Need for Acute Rehabilitation Level of Care: The patient requires daily medical management by a physician with  specialized training in physical medicine and rehabilitation for the following conditions: Daily direction of a multidisciplinary physical rehabilitation program to ensure safe treatment while eliciting the highest outcome that is of practical value to the patient.: Yes Daily medical management of patient stability for increased activity during participation in an intensive rehabilitation regime.: Yes Daily analysis of laboratory values and/or radiology reports with any subsequent need for medication adjustment of medical intervention for : Blood pressure problems;Neurological problems   I attest that I was present, lead the team conference, and concur with the assessment and plan of the team.   [name] 03/04/2018, 1:35 PM

## 2018-03-05 ENCOUNTER — Inpatient Hospital Stay (HOSPITAL_COMMUNITY): Payer: Medicare Other | Admitting: Physical Therapy

## 2018-03-05 ENCOUNTER — Inpatient Hospital Stay (HOSPITAL_COMMUNITY): Payer: Medicare Other

## 2018-03-05 ENCOUNTER — Inpatient Hospital Stay (HOSPITAL_COMMUNITY): Payer: Medicare Other | Admitting: Occupational Therapy

## 2018-03-05 LAB — BASIC METABOLIC PANEL
Anion gap: 9 (ref 5–15)
BUN: 40 mg/dL — AB (ref 8–23)
CHLORIDE: 114 mmol/L — AB (ref 98–111)
CO2: 18 mmol/L — ABNORMAL LOW (ref 22–32)
CREATININE: 1.7 mg/dL — AB (ref 0.44–1.00)
Calcium: 9 mg/dL (ref 8.9–10.3)
GFR calc Af Amer: 34 mL/min — ABNORMAL LOW (ref >60)
GFR calc non Af Amer: 29 mL/min — ABNORMAL LOW (ref >60)
GLUCOSE: 135 mg/dL — AB (ref 70–99)
Potassium: 4.2 mmol/L (ref 3.5–5.1)
Sodium: 141 mmol/L (ref 135–145)

## 2018-03-05 LAB — GLUCOSE, CAPILLARY
GLUCOSE-CAPILLARY: 113 mg/dL — AB (ref 70–99)
Glucose-Capillary: 125 mg/dL — ABNORMAL HIGH (ref 70–99)
Glucose-Capillary: 126 mg/dL — ABNORMAL HIGH (ref 70–99)
Glucose-Capillary: 161 mg/dL — ABNORMAL HIGH (ref 70–99)

## 2018-03-05 MED ORDER — LISINOPRIL 5 MG PO TABS
5.0000 mg | ORAL_TABLET | Freq: Every day | ORAL | Status: DC
  Administered 2018-03-05 – 2018-03-11 (×7): 5 mg via ORAL
  Filled 2018-03-05 (×6): qty 1

## 2018-03-05 NOTE — Progress Notes (Signed)
Patient got up to use bathroom at 0120 and noticed that her right arm was swollen and tight where her IV was located. Nurse tech made me aware, and upon assessment, I noticed that patient's arm was swollen. Fluids stopped and IV team consult put in.

## 2018-03-05 NOTE — Progress Notes (Signed)
Occupational Therapy Session Note  Patient Details  Name: [name] MRN: [id number] Date of Birth: February 11, 1946  Today's Date: 03/05/2018 OT Individual Time: 0703-0800 and 1300-1345 OT Individual Time Calculation (min): 57 min and 45 min   Short Term Goals: Week 1:  OT Short Term Goal 1 (Week 1): Pt will complete 1/3 components of donning pants OT Short Term Goal 2 (Week 1): Pt will complete toilet transfer with Min A and LRAD OT Short Term Goal 3 (Week 1): Pt will scan to Lt for ADL items with min vcs during 2 consecutive ADL sessions   Skilled Therapeutic Interventions/Progress Updates:    1)Treatment session with focus on ADL retraining with functional transfers, sit <> stand, and hemi-technique for dressing.  Pt received upright in recliner reporting fatigued from events overnight but willing to engage in bathing at shower level.  Completed stand pivot transfer recliner > w/c > shower seat with mod assist for sit > stand from lower surfaces then min assist for transfer.  Therapist provided hand over hand to incorporate use of LUE into bathing.  Min assist for standing balance while pt attempted to wash buttocks, requiring assistance this session.  Pt able to obtain figure 4 position to wash lower legs and then when completing LB dressing.  Min cues to dress Lt side of body first with pt demonstrating increased success with LB dressing, however due to long sleeved shirt pt required increased assistance.  Focus on controlled descent with stand > sit with pt demonstrating improved control throughout session.  Returned to recliner and left upright with breakfast tray set up and chair alarm on and all needs in reach.  2) Treatment session with focus on LUE NMR.  NMES applied to facilitate wrist flexion/extension and finger/flexion extension.  Pt reports no pain with movement and encouraged to see fingers opening and closing.  Engaged in towel glides and towel scrunches post e-stim with pt able  to squeeze towel loosely.  Ratio 1:3 Rate 35 pps Waveform- Asymmetric Ramp 1.0 Pulse 300 Intensity- 32 Duration -   8 mins flexion, 8 mins extension   No adverse reactions after treatment and is skin intact.    Therapy Documentation Precautions:  Precautions Precautions: Fall Precaution Comments: Lt hemi Restrictions Weight Bearing Restrictions: No  Pain:   Pt with no c/o pain  See Function Navigator for Current Functional Status.   Therapy/Group: Individual Therapy  [name] 03/05/2018, 8:01 AM

## 2018-03-05 NOTE — Progress Notes (Signed)
Physical Therapy Session Note  Patient Details  Name: [name] MRN: [id number] Date of Birth: March 28, 1946  Today's Date: 03/05/2018 PT Individual Time: 1100-1200 PT Individual Time Calculation (min): 60 min   Short Term Goals: Week 1:  PT Short Term Goal 1 (Week 1): pt will roll R wiht 1 cues or less for LUE PT Short Term Goal 2 (Week 1): pt will move L sidelying> sit with min assist PT Short Term Goal 3 (Week 1): pt will transfer bed>< w/c to L and R with mod assist PT Short Term Goal 4 (Week 1): pt will propel w/c x 150 with supervsion PT Short Term Goal 5 (Week 1): pt will perform gait x 150' with multiple turns with min assist, LRAD  Skilled Therapeutic Interventions/Progress Updates:    Pt received seated in w/c in room, agreeable to PT. No complaints of pain. SPT recliner to w/c with min A. Manual w/c propulsion x 75 ft with BLE and Supervision. Sit to stand 2 x 5 reps from mat table with manual cues for WBing through LLE with stance, focus on decreased reliance on BLE bracing on table as well as eccentric control when sitting. Standing balance with min A with use of mirror for visual feedback. Pt exhibits lean to the L in standing and needs min manual cues to correct weight shift. Ambulation 3 x 20', 1 x 40' with min A for balance, focus on L heel strike with gait and attention to obstacles in L visual field. Seated balance EOM with manual cues through L UE/LE for WBing while leaning outside BOS to reach for bean bags with RUE. Seated BP 159/58 during therapy session. Pt left seated in recliner in room with needs in reach and chair alarm in place.  Therapy Documentation Precautions:  Precautions Precautions: Fall Precaution Comments: Lt hemi Restrictions Weight Bearing Restrictions: No  See Function Navigator for Current Functional Status.   Therapy/Group: Individual Therapy  Excell Seltzer, PT, DPT  03/05/2018, 12:03 PM

## 2018-03-05 NOTE — Progress Notes (Signed)
Physical Therapy Session Note  Patient Details  Name: [name] MRN: [id number] Date of Birth: January 16, 1946  Today's Date: 03/04/2018 PT Individual Time 1630-1700   30 min   Short Term Goals: Week 1:  PT Short Term Goal 1 (Week 1): pt will roll R wiht 1 cues or less for LUE PT Short Term Goal 2 (Week 1): pt will move L sidelying> sit with min assist PT Short Term Goal 3 (Week 1): pt will transfer bed>< w/c to L and R with mod assist PT Short Term Goal 4 (Week 1): pt will propel w/c x 150 with supervsion PT Short Term Goal 5 (Week 1): pt will perform gait x 150' with multiple turns with min assist, LRAD  Skilled Therapeutic Interventions/Progress Updates:   Pt received sitting in WC and agreeable to PT  Gait training instructed by PT with min assist and no AD x 62f. Cues for improved step length and increased heel contact on L to prevent foot drag. BP assessed following gait training 197/87 initially allowed to rest for 3 minutes. 199/105. BP reassessed again after additional 3 minute rest break 185/76.  Pt instructed in additional gait training back to room x 40 ft with min assist; was noted to have improved step length/height on this bout.   Patient returned to room and left sitting in recliner with call bell in reach and all needs met.        Therapy Documentation Precautions:  Precautions Precautions: Fall Precaution Comments: Lt hemi Restrictions Weight Bearing Restrictions: No Pain; denies  See Function Navigator for Current Functional Status.   Therapy/Group: Individual Therapy  ALorie Phenix8/29/2019, 8:08 AM

## 2018-03-05 NOTE — Progress Notes (Signed)
PHYSICAL MEDICINE & REHABILITATION     PROGRESS NOTE  Subjective/Complaints:   No pain c/os, discussed labs and BP management with pt and daughter  ROS: denies CP, SOB, N/V/D  Objective: Vital Signs: Blood pressure (!) 153/67, pulse 76, temperature 98.2 F (36.8 C), resp. rate 17, height 5' 3.25" (1.607 m), weight 69 kg, SpO2 96 %. No results found. Recent Labs    03/04/18 0509  WBC 7.3  HGB 11.2*  HCT 33.8*  PLT 263   Recent Labs    03/04/18 0509 03/05/18 0658  NA 139 141  K 4.0 4.2  CL 114* 114*  GLUCOSE 148* 135*  BUN 42* 40*  CREATININE 1.77* 1.70*  CALCIUM 8.9 9.0   CBG (last 3)  Recent Labs    03/04/18 1629 03/04/18 2135 03/05/18 0653  GLUCAP 159* 171* 125*    Wt Readings from Last 3 Encounters:  03/04/18 69 kg  02/24/18 71.3 kg  02/24/18 71.4 kg    Physical Exam:  BP (!) 153/67 (BP Location: Left Arm)   Pulse 76   Temp 98.2 F (36.8 C)   Resp 17   Ht 5' 3.25" (1.607 m)   Wt 69 kg   SpO2 96%   BMI 26.73 kg/m  Constitutional: NAD. She appears well-developed and well-nourished HENT: Normocephalic. Atraumatic. Eyes: EOMI. No discharge. Cardiovascular: RRR. No JVD.  Respiratory: Effort normal and breath sounds normal.  GI: She exhibits no distension.  Musculoskeletal: No edema or tenderness in extremities Neurological: She is alert and oriented Motor: LUE 3/5 prox to distal (stable) LLE 4/5 prox to distal (stable) RUE/RLE 5/5 proximal to distal.  Skin: Skin is warm. Intact. Psychiatric: She has a normal mood and affect.   Assessment/Plan: 1. 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 Physiatrist is providing close team supervision and 24 hour management of active medical problems listed below. Physiatrist and rehab team continue to assess barriers to  discharge/monitor patient progress toward functional and medical goals.  Function:  Bathing Bathing position   Position: Shower  Bathing parts Body parts bathed by patient: Chest, Abdomen, Front perineal area, Right upper leg, Left upper leg, Left arm, Right lower leg, Left lower leg Body parts bathed by helper: Right arm, Back, Buttocks  Bathing assist Assist Level: Touching or steadying assistance(Pt > 75%)      Upper Body Dressing/Undressing Upper body dressing   What is the patient wearing?: Pull over shirt/dress     Pull over shirt/dress - Perfomed by patient: Put head through opening Pull over shirt/dress - Perfomed by helper: Thread/unthread left sleeve, Pull shirt over trunk, Thread/unthread right sleeve(long sleeved shirt)        Upper body assist Assist Level: (Max assist)      Lower Body Dressing/Undressing Lower body dressing   What is the patient wearing?: Underwear, Pants, Non-skid slipper socks Underwear - Performed by patient: Thread/unthread right underwear leg, Thread/unthread left underwear leg, Pull underwear up/down Underwear - Performed by helper: Thread/unthread right underwear leg, Pull underwear up/down Pants- Performed by patient: Thread/unthread left pants leg, Pull pants up/down Pants- Performed by helper: Thread/unthread right pants leg Non-skid slipper socks- Performed by patient: Don/doff right sock Non-skid slipper socks- Performed by helper: Don/doff right sock, Don/doff left sock     Shoes - Performed by patient: Don/doff right shoe Shoes - Performed by helper: Don/doff left shoe  Lower body assist Assist for lower body dressing: (Mod assist)      Toileting Toileting   Toileting steps completed by patient: Adjust clothing prior to toileting, Performs perineal hygiene Toileting steps completed by helper: Adjust clothing after toileting    Toileting assist Assist level: Touching or steadying assistance (Pt.75%)    Transfers Chair/bed transfer   Chair/bed transfer method: Stand pivot Chair/bed transfer assist level: Touching or steadying assistance (Pt > 75%) Chair/bed transfer assistive device: Armrests     Locomotion Ambulation     Max distance: 40 ft Assist level: Touching or steadying assistance (Pt > 75%)   Wheelchair   Type: Manual Max wheelchair distance: 60 ft(BLE) Assist Level: Supervision or verbal cues  Cognition Comprehension Comprehension assist level: Follows basic conversation/direction with no assist  Expression Expression assist level: Expresses complex 90% of the time/cues < 10% of the time  Social Interaction Social Interaction assist level: Interacts appropriately with others with medication or extra time (anti-anxiety, antidepressant).  Problem Solving Problem solving assist level: Solves basic 75 - 89% of the time/requires cueing 10 - 24% of the time  Memory Memory assist level: Recognizes or recalls 90% of the time/requires cueing < 10% of the time     Medical Problem List and Plan:  1. 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 DVT Prophylaxis/Anticoagulation: SCDs  3. Pain Management: N/A  4. Mood: LCSW to follow for evaluation and support.  5. Neuropsych: This patient is capable of making decisions on his own behalf.  6. Skin/Wound Care: routine pressure relief measures.  7. Fluids/Electrolytes/Nutrition: Maintain adequate nutritional and hydration status. I 764ml- will run IVF at noc 8. HTN: Difficult to control PTA.   Lisinopril increased 7.5 mg and 8/25- mild cough, ? Related to ACE, pt states she had a cough "all summer" , consider ARB- no current c/os  Hydralazine 10 mg every 8 when necessary   Vitals:   03/04/18 1402 03/04/18 1922  BP: (!) 151/71 (!) 153/67  Pulse: 84 76  Resp: 16 17  Temp: 98.2 F (36.8 C) 98.2 F (36.8 C)   SpO2: 97% 96%  remains labile-monitor for pattern- given CKD amlodipine may be better agent will check PCP documentation to see if this has been tried as OP Per Dr Deborra Medina , pt stopped amlodipine and lisinopril at home, allergy to clonidine Slightly improved sysolic on amlodipine , reduce the lisinopril to 5mg  9. T2DM: Hgb A1C- 7.3. Check blood sugars before meals and at bedtime.   Patient had been on Glucophage 500 mg twice daily prior to admission   Monitor with increased mobility   CBG (last 3)  Recent Labs    03/04/18 1629 03/04/18 2135 03/05/18 0653  GLUCAP 159* 171* 125*  controlled 8/28         10. CKD: Baseline SCr 1.6. Encourage fluid intake. Monitor renal status with serial checks for stability.   Repeat BMET after 1 L IVF, mildly improved will cont qhs 2D ECHO showed normal EF 11.  Hgb 10.9gm- baseline ~12 will check stool OB, no hematochezia  LOS (Days) 6 A FACE TO FACE EVALUATION WAS PERFORMED  Charlett Blake 03/05/2018 8:24 AM

## 2018-03-05 NOTE — Progress Notes (Signed)
Physical Therapy Session Note  Patient Details  Name: [name] MRN: [id number] Date of Birth: 05-23-1946  Today's Date: 03/05/2018 PT Individual Time: 1410-1455 PT Individual Time Calculation (min): 45 min   Short Term Goals: Week 1:  PT Short Term Goal 1 (Week 1): pt will roll R wiht 1 cues or less for LUE PT Short Term Goal 2 (Week 1): pt will move L sidelying> sit with min assist PT Short Term Goal 3 (Week 1): pt will transfer bed>< w/c to L and R with mod assist PT Short Term Goal 4 (Week 1): pt will propel w/c x 150 with supervsion PT Short Term Goal 5 (Week 1): pt will perform gait x 150' with multiple turns with min assist, LRAD  Skilled Therapeutic Interventions/Progress Updates:   No c/o pain.  W/c propulsion using bil LEs for neuro re-ed LLE, in ultra hemi ht w/c, x 50' before tiring, with supervision.  Stand pivot from 14" high w/c, mod/max assist.  Stand pivot from 20" high mat, mod assist.   neuromuscular re-education via mulitmodal cues, demo for reciprocal scooting in sitting to activate L pelvic muscles, seated bil heel lifts focusing on eccentric control.  Pt issued hemi ht w/c with seat ht 17" to facilitate sit>< stand.  W/c propulsion using hemi method x 50' with supervision.  Pt locked L brake with hand over hand assistance, R brake with 1 cue.  Pt left resting in w/c with seat alarm set and all needs at hand.     Therapy Documentation Precautions:  Precautions Precautions: Fall Precaution Comments: Lt hemi Restrictions Weight Bearing Restrictions: No      See Function Navigator for Current Functional Status.   Therapy/Group: Individual Therapy  Reika Callanan 03/05/2018, 3:15 PM

## 2018-03-06 ENCOUNTER — Inpatient Hospital Stay (HOSPITAL_COMMUNITY): Payer: Medicare Other | Admitting: Physical Therapy

## 2018-03-06 ENCOUNTER — Inpatient Hospital Stay (HOSPITAL_COMMUNITY): Payer: Medicare Other | Admitting: Occupational Therapy

## 2018-03-06 LAB — GLUCOSE, CAPILLARY
GLUCOSE-CAPILLARY: 117 mg/dL — AB (ref 70–99)
GLUCOSE-CAPILLARY: 127 mg/dL — AB (ref 70–99)
Glucose-Capillary: 128 mg/dL — ABNORMAL HIGH (ref 70–99)
Glucose-Capillary: 150 mg/dL — ABNORMAL HIGH (ref 70–99)

## 2018-03-06 NOTE — Progress Notes (Signed)
[facility] PHYSICAL MEDICINE & REHABILITATION     PROGRESS NOTE  Subjective/Complaints:     ROS: denies CP, SOB, N/V/D  Objective: Vital Signs: Blood pressure (!) 150/59, pulse 72, temperature 98.4 F (36.9 C), temperature source Oral, resp. rate 18, height 5' 3.25" (1.607 m), weight 69 kg, SpO2 100 %. No results found. Recent Labs    03/04/18 0509  WBC 7.3  HGB 11.2*  HCT 33.8*  PLT 263   Recent Labs    03/04/18 0509 03/05/18 0658  NA 139 141  K 4.0 4.2  CL 114* 114*  GLUCOSE 148* 135*  BUN 42* 40*  CREATININE 1.77* 1.70*  CALCIUM 8.9 9.0   CBG (last 3)  Recent Labs    03/05/18 1705 03/05/18 2244 03/06/18 0656  GLUCAP 126* 161* 128*    Wt Readings from Last 3 Encounters:  03/04/18 69 kg  02/24/18 71.3 kg  02/24/18 71.4 kg    Physical Exam:  BP (!) 150/59 (BP Location: Right Arm)   Pulse 72   Temp 98.4 F (36.9 C) (Oral)   Resp 18   Ht 5' 3.25" (1.607 m)   Wt 69 kg   SpO2 100%   BMI 26.73 kg/m  Constitutional: NAD. She appears well-developed and well-nourished HENT: Normocephalic. Atraumatic. Eyes: EOMI. No discharge. Cardiovascular: RRR. No JVD.  Respiratory: Effort normal and breath sounds normal.  GI: She exhibits no distension.  Musculoskeletal: No edema or tenderness in extremities Neurological: She is alert and oriented Motor: LUE 3/5 prox to distal (stable) LLE 4/5 prox to distal (stable) RUE/RLE 5/5 proximal to distal.  Skin: Skin is warm. Intact. Psychiatric: She has a normal mood and affect.   Assessment/Plan: 1. 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 Physiatrist is providing close team supervision and 24 hour management of active medical problems listed below. Physiatrist and rehab team continue to assess barriers to discharge/monitor patient progress toward  functional and medical goals.  Function:  Bathing Bathing position   Position: Shower  Bathing parts Body parts bathed by patient: Chest, Abdomen, Front perineal area, Right upper leg, Left upper leg, Left arm, Right lower leg, Left lower leg Body parts bathed by helper: Right arm, Back, Buttocks  Bathing assist Assist Level: Touching or steadying assistance(Pt > 75%)      Upper Body Dressing/Undressing Upper body dressing   What is the patient wearing?: Pull over shirt/dress     Pull over shirt/dress - Perfomed by patient: Put head through opening Pull over shirt/dress - Perfomed by helper: Thread/unthread left sleeve, Pull shirt over trunk, Thread/unthread right sleeve(long sleeved shirt)        Upper body assist Assist Level: (Max assist)      Lower Body Dressing/Undressing Lower body dressing   What is the patient wearing?: Underwear, Pants, Non-skid slipper socks Underwear - Performed by patient: Thread/unthread right underwear leg, Thread/unthread left underwear leg, Pull underwear up/down Underwear - Performed by helper: Thread/unthread right underwear leg, Pull underwear up/down Pants- Performed by patient: Thread/unthread left pants leg, Pull pants up/down Pants- Performed by helper: Thread/unthread right pants leg Non-skid slipper socks- Performed by patient: Don/doff right sock Non-skid slipper socks- Performed by helper: Don/doff right sock, Don/doff left sock     Shoes - Performed by patient: Don/doff right shoe Shoes - Performed by helper: Don/doff left shoe  Lower body assist Assist for lower body dressing: (Mod assist)      Toileting Toileting   Toileting steps completed by patient: Adjust clothing prior to toileting, Performs perineal hygiene Toileting steps completed by helper: Adjust clothing after toileting    Toileting assist Assist level: Touching or steadying assistance (Pt.75%)   Transfers Chair/bed transfer   Chair/bed transfer  method: Stand pivot Chair/bed transfer assist level: Moderate assist (Pt 50 - 74%/lift or lower) Chair/bed transfer assistive device: Armrests     Locomotion Ambulation     Max distance: 40 ft Assist level: Touching or steadying assistance (Pt > 75%)   Wheelchair   Type: Manual Max wheelchair distance: 50 Assist Level: Supervision or verbal cues  Cognition Comprehension Comprehension assist level: Follows basic conversation/direction with no assist  Expression Expression assist level: Expresses basic needs/ideas: With extra time/assistive device  Social Interaction Social Interaction assist level: Interacts appropriately with others with medication or extra time (anti-anxiety, antidepressant).  Problem Solving Problem solving assist level: Solves basic 75 - 89% of the time/requires cueing 10 - 24% of the time  Memory Memory assist level: Recognizes or recalls 90% of the time/requires cueing < 10% of the time     Medical Problem List and Plan:  1. 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 DVT Prophylaxis/Anticoagulation: SCDs  3. Pain Management: N/A  4. Mood: LCSW to follow for evaluation and support.  5. Neuropsych: This patient is capable of making decisions on his own behalf.  6. Skin/Wound Care: routine pressure relief measures.  7. Fluids/Electrolytes/Nutrition: Maintain adequate nutritional and hydration status. I 725ml- will run IVF at noc 8. HTN: Difficult to control PTA.   Lisinopril increased 7.5 mg and 8/25- mild cough, ? Related to ACE, pt states she had a cough "all summer" , consider ARB- no current c/os  Hydralazine 10 mg every 8 when necessary   Vitals:   03/05/18 2241 03/06/18 0612  BP: (!) 158/62 (!) 150/59  Pulse: 75 72  Resp: 12 18  Temp: 97.8 F (36.6 C) 98.4 F (36.9 C)  SpO2: 96% 100%  remains labile-monitor for  pattern- given CKD amlodipine may be better Slightly improved sysolic on amlodipine , reduce the lisinopril to 5mg  Goal is to manage on one agent if possible 9. T2DM: Hgb A1C- 7.3. Check blood sugars before meals and at bedtime.   Patient had been on Glucophage 500 mg twice daily prior to admission   Monitor with increased mobility   CBG (last 3)  Recent Labs    03/05/18 1705 03/05/18 2244 03/06/18 0656  GLUCAP 126* 161* 128*  controlled 8/30         10. CKD: Baseline SCr 1.6. Encourage fluid intake. Monitor renal status with serial checks for stability.   Repeat BMET after 1 L IVF, mildly improved will cont qhs- recheck BMET Sunday, may be able to d/c IVF at noc 2D ECHO showed normal EF 11.  Hgb 10.9gm- baseline ~12 will check stool OB, no hematochezia  LOS (Days) 7 A FACE TO FACE EVALUATION WAS PERFORMED   Charlett Blake 03/06/2018 8:06 AM

## 2018-03-06 NOTE — Progress Notes (Signed)
Occupational Therapy Weekly Progress Note  Patient Details  Name: [name] MRN: [id number] Date of Birth: 08-02-1945  Beginning of progress report period: February 28, 2018 End of progress report period: March 06, 2018  Today's Date: 03/06/2018 OT Individual Time: 1010-1104 OT Individual Time Calculation (min): 54 min    Patient has met 3 of 3 short term goals.  Pt is making steady progress towards goals.  Pt currently requires min-mod assist with stand pivot transfers from w/c.  PT provided pt with 17" w/c allowing pt to complete sit > stand with min assist more consistently due to elevated seat height, therefore decreasing burden of care with transfers and self-care tasks from w/c.  Pt continues to demonstrate Lt inattention often ignoring LUE during mobility and self-care tasks.  Pt is developing increased tone in LUE, specifically at elbow and wrist, however is developing increased AROM in shoulder.  Patient continues to demonstrate the following deficits: muscle weakness, decreased cardiorespiratoy endurance, impaired timing and sequencing, abnormal tone, unbalanced muscle activation, decreased coordination and decreased motor planning, decreased midline orientation, decreased attention to left, left side neglect and decreased motor planning, decreased initiation, decreased attention, decreased awareness, decreased problem solving and decreased safety awareness and decreased sitting balance, decreased standing balance, decreased postural control and hemiplegia and therefore will continue to benefit from skilled OT intervention to enhance overall performance with BADL and Reduce care partner burden.  Patient progressing toward long term goals..  Continue plan of care.  OT Short Term Goals Week 1:  OT Short Term Goal 1 (Week 1): Pt will complete 1/3 components of donning pants OT Short Term Goal 1 - Progress (Week 1): Met OT Short Term Goal 2 (Week 1): Pt will complete toilet transfer  with Min A and LRAD OT Short Term Goal 2 - Progress (Week 1): Met OT Short Term Goal 3 (Week 1): Pt will scan to Lt for ADL items with min vcs during 2 consecutive ADL sessions  OT Short Term Goal 3 - Progress (Week 1): Met Week 2:  OT Short Term Goal 1 (Week 2): Pt will don shoes with mod assist utilizing AE as needed OT Short Term Goal 2 (Week 2): Pt will complete UB dressing with min assist OT Short Term Goal 3 (Week 2): Pt will complete toilet transfer with min assist with RW OT Short Term Goal 4 (Week 2): Pt will maintain standing with min assist to complete 2 grooming tasks  Skilled Therapeutic Interventions/Progress Updates:    Treatment session with focus on functional transfers and LUE NMR.  Pt completed stand pivot transfer recliner > w/c > toilet with min assist with improved anterior weight shift and lift off from increased height w/c.  Min cues for technique to manage clothing post toileting with min assist for standing balance.  Engaged in [place] in sitting on edge of therapy mat.  Engaged in weight bearing through LUE while reaching across midline with RUE to further facilitate weight bearing.  Post weight bearing, engaged in reaching with LUE to promote elbow extension and then hand over hand to facilitate grasp and release with cups.  Pt able to transport cup across midline on release on target utilizing shoulder ROM in addition to tone in elbow and wrist with therapist assist to maintain grasp on cup.  Pt motivated by increased movements in LUE.  Therapy Documentation Precautions:  Precautions Precautions: Fall Precaution Comments: Lt hemi Restrictions Weight Bearing Restrictions: No Pain: Pain Assessment Pain Scale: 0-10 Pain Score:  0-No pain  See Function Navigator for Current Functional Status.   Therapy/Group: Individual Therapy  [name] 03/06/2018, 12:34 PM

## 2018-03-06 NOTE — Progress Notes (Signed)
Physical Therapy Weekly Progress Note  Patient Details  Name: [name] MRN: [id number] Date of Birth: 08-23-1945  Beginning of progress report period: February 28, 2018 End of progress report period: March 06, 2018  Today's Date: 03/06/2018 PT Individual Time: 0810-0903 and 6440-3474 PT Individual Time Calculation (min): 53 min and 69 min   Patient has met 3 of 5 short term goals.  Pt is making good progress towards LTG's. Pt continues to demonstrate impaired endurance & decreased LLE strengthening that limits gait distances. Pt is able to ambulate 50 ft or less at a time with min assist with or without AD and requires min assist for stand pivot transfers. Pt would benefit from continued skilled PT treatment to focus on endurance, strengthening, L NMR, transfers, safety awareness, gait, and w/c mobility and for pt/family education prior to d/c.  Patient continues to demonstrate the following deficits muscle weakness, decreased cardiorespiratoy endurance, decreased coordination, decreased visual perceptual skills, decreased attention to left, decreased problem solving and decreased safety awareness, and decreased standing balance, decreased postural control, hemiplegia and decreased balance strategies and therefore will continue to benefit from skilled PT intervention to increase functional independence with mobility.  Patient progressing toward long term goals..  Continue plan of care.  PT Short Term Goals Week 1:  PT Short Term Goal 1 (Week 1): pt will roll R wiht 1 cues or less for LUE PT Short Term Goal 1 - Progress (Week 1): Met PT Short Term Goal 2 (Week 1): pt will move L sidelying> sit with min assist PT Short Term Goal 2 - Progress (Week 1): Met PT Short Term Goal 3 (Week 1): pt will transfer bed>< w/c to L and R with mod assist PT Short Term Goal 3 - Progress (Week 1): Met PT Short Term Goal 4 (Week 1): pt will propel w/c x 150 with supervsion PT Short Term Goal 4 - Progress  (Week 1): Not met PT Short Term Goal 5 (Week 1): pt will perform gait x 150' with multiple turns with min assist, LRAD PT Short Term Goal 5 - Progress (Week 1): Not met Week 2:  PT Short Term Goal 1 (Week 2): Pt will ambulate 75 ft with LRAD & supervision. PT Short Term Goal 2 (Week 2): Pt will complete car transfer with min assist.  PT Short Term Goal 3 (Week 2): Pt will negotiate 12 steps with rails and min assist.  Skilled Therapeutic Interventions/Progress Updates:  Treatment 1: Pt received on [facility] with [name]) present & requesting to be checked off for bed<>BSC transfers. Pt performed peri hygiene with set up assist to obtain toilet paper. Pt & daughter donned brief and performed stand pivot BSC>bed with safety & minimal cuing from therapist. Updated safety plan to reflect daughter's ability to assist pt bed<>BSC via stand pivot. Therapist assisted with donning pants with assist to pull pants over hips and cuing to thread LLE first instead of RLE. Daughter provided max assist for donning shoes for time management - educated pt & daughter on benefit of using tennis shoes with laces. Transported pt to gym via w/c total assist for time management. Gait with RW & L hand orthosis x 33 ft + 33 ft + 43 ft with min assist and therapist providing manual facilitation for weight shifting R to allow increased ease of foot clearance LLE and multimodal cuing for increased heel strike LLE with cuing to attend to L side and for management of RW. Pt's LLE fatigues & experiences  buckling as task progresses. Bed mobility completed in apartment with pt reporting her bed at home is shorter than simulator bed. Pt completes sit<>supine and rolling L<>R with supervision & cuing to attend to LUE when rolling and to sit closer to [facility] to limit need to scoot to [facility] once supine as pt demonstrates difficulty with this. At end of session pt assisted to recliner in room with max cuing for hand placement and sequencing for all  transfers. Pt left sitting in recliner with BLE elevated, chair alarm in place & all needs in reach.  Treatment 2: Pt received in w/c & agreeable to tx. No c/o pain reported. Transported pt to gym via w/c total assist for time management. Gait x 43 ft + 43 ft with RW & L hand orthosis with min assist progressing to steady assist with slight manual facilitation R to allow increased foot clearance and step length LLE with cuing for increased LLE heel strike with rest breaks PRN. Pt utilized dynavision while standing with normal and narrow BOS with steady assist increasing to min assist with task focusing on dynamic standing balance. Utilized biodex limits of stability with RUE support with min assist & max cuing for weight shifting in all directions. Pt with impaired ankle reactions and weight shifting. Pt also with L lateral weight shift in static stance with therapist providing manual facilitation to R for equal weight bearing BLE with pt reporting midline feels like she's leaning R. Educated pt on impaired midline orientation following stroke. Pt propelled w/c back to room with R hemi technique and supervision. Pt completes w/c>recliner with steady assist with improving hand placement and technique. Pt completed 5x sit<>stand with min assist with cuing to prevent posterior BLE lean on recliner. Pt reporting increasing fatigue at end of session. Pt left sitting in recliner with chair alarm donned & all needs in reach.  Therapy Documentation Precautions:  Precautions Precautions: Fall Precaution Comments: Lt hemi Restrictions Weight Bearing Restrictions: No   See Function Navigator for Current Functional Status.  Therapy/Group: Individual Therapy  [name] 03/06/2018, 3:49 PM

## 2018-03-07 ENCOUNTER — Inpatient Hospital Stay (HOSPITAL_COMMUNITY): Payer: Medicare Other | Admitting: Occupational Therapy

## 2018-03-07 LAB — GLUCOSE, CAPILLARY
Glucose-Capillary: 117 mg/dL — ABNORMAL HIGH (ref 70–99)
Glucose-Capillary: 141 mg/dL — ABNORMAL HIGH (ref 70–99)
Glucose-Capillary: 101 mg/dL — ABNORMAL HIGH (ref 70–99)
Glucose-Capillary: 120 mg/dL — ABNORMAL HIGH (ref 70–99)

## 2018-03-07 LAB — OCCULT BLOOD X 1 CARD TO LAB, STOOL: FECAL OCCULT BLD: NEGATIVE

## 2018-03-07 NOTE — Progress Notes (Signed)
Occupational Therapy Session Note  Patient Details  Name: STEPHANNY TSUTSUI MRN: [id number] Date of Birth: Feb 17, 1946  Today's Date: 03/07/2018 OT Individual Time: 1000-1045 OT Individual Time Calculation (min): 45 min    Skilled Therapeutic Interventions/Progress Updates: focus this session was on left hemi upper and lower extremity weight bearing, static balance,  and neuro re education  Also, patient stated until her left UE IV comes out, she cannot wear the night splint because it pressures the plastic piece on the IV into her forearm but that she would like suggestion on something to place in her palm while sleeping to inhibit digital flexion/increased tone while sleeping.   She stated she thinks her IV is to come out on Sunday night. So, in the meantime, This clinician helped her make a thick wash cloth roll that she can place into her palm while sleeping    Therapy Documentation Precautions:  Precautions Precautions: Fall Precaution Comments: Lt hemi Restrictions Weight Bearing Restrictions: No  Pain:denied   See Function Navigator for Current Functional Status.   Therapy/Group: Individual Therapy  Alfredia Ferguson [facility] 03/07/2018, 3:54 PM

## 2018-03-07 NOTE — Progress Notes (Signed)
[name] PHYSICAL MEDICINE & REHABILITATION     PROGRESS NOTE  Subjective/Complaints:  Patient is anxious to get home but understands that there still is work to do   ROS: Patient denies fever, rash, sore throat, blurred vision, nausea, vomiting, diarrhea, cough, shortness of breath or chest pain, joint or back pain, headache, or mood change.   Objective: Vital Signs: Blood pressure (!) 170/68, pulse 66, temperature 98.3 F (36.8 C), temperature source Oral, resp. rate 18, height 5' 3.25" (1.607 m), weight 69 kg, SpO2 97 %. No results found. No results for input(s): WBC, HGB, HCT, PLT in the last 72 hours. Recent Labs    03/05/18 0658  NA 141  K 4.2  CL 114*  GLUCOSE 135*  BUN 40*  CREATININE 1.70*  CALCIUM 9.0   CBG (last 3)  Recent Labs    03/06/18 1643 03/06/18 2224 03/07/18 0700  GLUCAP 117* 127* 117*    Wt Readings from Last 3 Encounters:  03/04/18 69 kg  02/24/18 71.3 kg  02/24/18 71.4 kg    Physical Exam:  BP (!) 170/68 (BP Location: Right Arm)   Pulse 66   Temp 98.3 F (36.8 C) (Oral)   Resp 18   Ht 5' 3.25" (1.607 m)   Wt 69 kg   SpO2 97%   BMI 26.73 kg/m  Constitutional: No distress . Vital signs reviewed. HEENT: EOMI, oral membranes moist Neck: supple Cardiovascular: RRR without murmur. No JVD    Respiratory: CTA Bilaterally without wheezes or rales. Normal effort    GI: BS +, non-tender, non-distended  Musculoskeletal: No edema or tenderness in extremities Neurological: She is alert and oriented Motor: LUE 3/5 prox to distal (no changes) LLE 4/5 prox to distal (no changes) RUE/RLE 5/5 proximal to distal.  Skin: Skin is warm. Intact. Psychiatric: She has a normal mood and affect.   Assessment/Plan: 1. 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 Physiatrist is providing close  team supervision and 24 hour management of active medical problems listed below. Physiatrist and rehab team continue to assess barriers to discharge/monitor patient progress toward functional and medical goals.  Function:  Bathing Bathing position   Position: Shower  Bathing parts Body parts bathed by patient: Chest, Abdomen, Front perineal area, Right upper leg, Left upper leg, Left arm, Right lower leg, Left lower leg Body parts bathed by helper: Right arm, Back, Buttocks  Bathing assist Assist Level: Touching or steadying assistance(Pt > 75%)      Upper Body Dressing/Undressing Upper body dressing   What is the patient wearing?: Pull over shirt/dress     Pull over shirt/dress - Perfomed by patient: Put head through opening Pull over shirt/dress - Perfomed by helper: Thread/unthread left sleeve, Pull shirt over trunk, Thread/unthread right sleeve(long sleeved shirt)        Upper body assist Assist Level: (Max assist)      Lower Body Dressing/Undressing Lower body dressing   What is the patient wearing?: Underwear, Pants, Non-skid slipper socks Underwear - Performed by patient: Thread/unthread right underwear leg, Thread/unthread left underwear leg, Pull underwear up/down Underwear - Performed by helper: Thread/unthread right underwear leg, Pull underwear up/down Pants- Performed by patient: Thread/unthread left pants leg, Pull pants up/down Pants- Performed by helper: Thread/unthread right pants leg Non-skid slipper socks- Performed by patient: Don/doff right sock Non-skid slipper socks- Performed by helper: Don/doff right sock, Don/doff  left sock     Shoes - Performed by patient: Don/doff right shoe Shoes - Performed by helper: Don/doff left shoe          Lower body assist Assist for lower body dressing: (Mod assist)      Toileting Toileting   Toileting steps completed by patient: Adjust clothing prior to toileting, Performs perineal hygiene, Adjust clothing after  toileting Toileting steps completed by helper: Adjust clothing after toileting    Toileting assist Assist level: Touching or steadying assistance (Pt.75%)   Transfers Chair/bed transfer   Chair/bed transfer method: Stand pivot Chair/bed transfer assist level: Touching or steadying assistance (Pt > 75%) Chair/bed transfer assistive device: Armrests     Locomotion Ambulation     Max distance: 43 ft Assist level: Touching or steadying assistance (Pt > 75%)   Wheelchair   Type: Manual Max wheelchair distance: 60 ft Assist Level: Supervision or verbal cues  Cognition Comprehension Comprehension assist level: Follows basic conversation/direction with no assist  Expression Expression assist level: Expresses basic needs/ideas: With extra time/assistive device  Social Interaction Social Interaction assist level: Interacts appropriately with others with medication or extra time (anti-anxiety, antidepressant).  Problem Solving Problem solving assist level: Solves basic 75 - 89% of the time/requires cueing 10 - 24% of the time  Memory Memory assist level: Recognizes or recalls 90% of the time/requires cueing < 10% of the time     Medical Problem List and Plan:  1. 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 DVT Prophylaxis/Anticoagulation: SCDs  3. Pain Management: N/A  4. Mood: LCSW to follow for evaluation and support.  5. Neuropsych: This patient is capable of making decisions on his own behalf.  6. Skin/Wound Care: routine pressure relief measures.  7. Fluids/Electrolytes/Nutrition: Maintain adequate nutritional and hydration status. I 715ml- will run IVF at noc 8. HTN: Difficult to control PTA.      Hydralazine 10 mg every 8 when necessary   Vitals:   03/07/18 0601 03/07/18 0645  BP: (!) 177/62 (!) 170/68  Pulse: 66   Resp: 18   Temp: 98.3 F  (36.8 C)   SpO2: 97%    some improvement with amlodipine , reduced   lisinopril to 5mg    Goal is to manage on one agent if possible 9. T2DM: Hgb A1C- 7.3. Check blood sugars before meals and at bedtime.   Patient had been on Glucophage 500 mg twice daily prior to admission   Monitor with increased mobility   CBG (last 3)  Recent Labs    03/06/18 1643 03/06/18 2224 03/07/18 0700  GLUCAP 117* 127* 117*  controlled 8/31         10. CKD: Baseline SCr 1.6. Encourage fluid intake. Monitor renal status with serial checks for stability.   Repeat BMET after 1 L IVF, mildly improved will cont qhs- recheck BMET Sunday, may be able to d/c IVF at noc 2D ECHO showed normal EF 11.  Hgb 10.9gm- baseline ~12 will check stool OB, no hematochezia   LOS (Days) 8 A FACE TO FACE EVALUATION WAS PERFORMED   Meredith Staggers 03/07/2018 10:01 AM

## 2018-03-08 ENCOUNTER — Inpatient Hospital Stay (HOSPITAL_COMMUNITY): Payer: Medicare Other | Admitting: Occupational Therapy

## 2018-03-08 LAB — BASIC METABOLIC PANEL
Anion gap: 6 (ref 5–15)
BUN: 36 mg/dL — ABNORMAL HIGH (ref 8–23)
CALCIUM: 8.8 mg/dL — AB (ref 8.9–10.3)
CO2: 21 mmol/L — ABNORMAL LOW (ref 22–32)
Chloride: 113 mmol/L — ABNORMAL HIGH (ref 98–111)
Creatinine, Ser: 1.6 mg/dL — ABNORMAL HIGH (ref 0.44–1.00)
GFR, EST AFRICAN AMERICAN: 36 mL/min — AB (ref >60)
GFR, EST NON AFRICAN AMERICAN: 31 mL/min — AB (ref >60)
Glucose, Bld: 147 mg/dL — ABNORMAL HIGH (ref 70–99)
Potassium: 4 mmol/L (ref 3.5–5.1)
SODIUM: 140 mmol/L (ref 135–145)

## 2018-03-08 LAB — GLUCOSE, CAPILLARY
GLUCOSE-CAPILLARY: 113 mg/dL — AB (ref 70–99)
GLUCOSE-CAPILLARY: 120 mg/dL — AB (ref 70–99)
Glucose-Capillary: 114 mg/dL — ABNORMAL HIGH (ref 70–99)
Glucose-Capillary: 136 mg/dL — ABNORMAL HIGH (ref 70–99)

## 2018-03-08 MED ORDER — AMLODIPINE BESYLATE 5 MG PO TABS
5.0000 mg | ORAL_TABLET | Freq: Every day | ORAL | Status: DC
  Administered 2018-03-09 – 2018-03-11 (×3): 5 mg via ORAL
  Filled 2018-03-08 (×3): qty 1

## 2018-03-08 MED ORDER — AMLODIPINE BESYLATE 2.5 MG PO TABS
2.5000 mg | ORAL_TABLET | Freq: Once | ORAL | Status: AC
  Administered 2018-03-08: 2.5 mg via ORAL
  Filled 2018-03-08: qty 1

## 2018-03-08 NOTE — Progress Notes (Signed)
Occupational Therapy Session Note  Patient Details  Name: [name] MRN: [id number] Date of Birth: Sep 22, 1945  Today's Date: 03/08/2018 OT Missed Time: 59 Minutes Missed Time Reason: Patient fatigue   Skilled Therapeutic Interventions/Progress Updates:    Pt greeted in w/c with dtr present. Reporting that she felt too fatigued/lethargic to participate in group. 60 minutes missed.   Therapy Documentation Precautions:  Precautions Precautions: Fall Precaution Comments: Lt hemi Restrictions Weight Bearing Restrictions: No ADL: ADL ADL Comments: Please see functional navigator for ADL status :    See Function Navigator for Current Functional Status.   Therapy/Group: Group Therapy  [name] A [name] 03/08/2018, 3:46 PM

## 2018-03-08 NOTE — Progress Notes (Signed)
[facility] [facility] PHYSICAL MEDICINE & REHABILITATION     PROGRESS NOTE  Subjective/Complaints:  Patient in good spirits.  Eating breakfast.  Appetite is good and she has been trying to drink more fluids.  ROS: Patient denies fever, rash, sore throat, blurred vision, nausea, vomiting, diarrhea, cough, shortness of breath or chest pain, joint or back pain, headache, or mood change.   Objective: Vital Signs: Blood pressure (!) 176/60, pulse 72, temperature 98.4 F (36.9 C), temperature source Oral, resp. rate 18, height 5' 3.25" (1.607 m), weight 69 kg, SpO2 98 %. No results found. No results for input(s): WBC, HGB, HCT, PLT in the last 72 hours. Recent Labs    03/08/18 0755  NA 140  K 4.0  CL 113*  GLUCOSE 147*  BUN 36*  CREATININE 1.60*  CALCIUM 8.8*   CBG (last 3)  Recent Labs    03/07/18 1640 03/07/18 2120 03/08/18 0624  GLUCAP 101* 120* 113*    Wt Readings from Last 3 Encounters:  03/04/18 69 kg  02/24/18 71.3 kg  02/24/18 71.4 kg    Physical Exam:  BP (!) 176/60 (BP Location: Right Arm) Comment: RN Notified  Pulse 72   Temp 98.4 F (36.9 C) (Oral)   Resp 18   Ht 5' 3.25" (1.607 m)   Wt 69 kg   SpO2 98%   BMI 26.73 kg/m  Constitutional: No distress . Vital signs reviewed. HEENT: EOMI, oral membranes moist Neck: supple Cardiovascular: RRR without murmur. No JVD    Respiratory: CTA Bilaterally without wheezes or rales. Normal effort    GI: BS +, non-tender, non-distended  Musculoskeletal: No edema or tenderness in extremities Neurological: She is alert and oriented Motor: LUE 3/5 prox to distal (stable) LLE 4/5 prox to distal (stable) RUE/RLE 5/5 proximal to distal.  Skin: Skin is warm. Intact. Psychiatric: Pleasant and cooperative.   Assessment/Plan: 1. 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 Physiatrist is providing close team supervision and 24 hour management of active medical problems listed below. Physiatrist and rehab team continue to assess barriers to discharge/monitor patient progress toward functional and medical goals.  Function:  Bathing Bathing position   Position: Shower  Bathing parts Body parts bathed by patient: Chest, Abdomen, Front perineal area, Right upper leg, Left upper leg, Left arm, Right lower leg, Left lower leg Body parts bathed by helper: Right arm, Back, Buttocks  Bathing assist Assist Level: Touching or steadying assistance(Pt > 75%)      Upper Body Dressing/Undressing Upper body dressing   What is the patient wearing?: Pull over shirt/dress     Pull over shirt/dress - Perfomed by patient: Put head through opening Pull over shirt/dress - Perfomed by helper: Thread/unthread left sleeve, Pull shirt over trunk, Thread/unthread right sleeve(long sleeved shirt)        Upper body assist Assist Level: (Max assist)      Lower Body Dressing/Undressing Lower body dressing   What is the patient wearing?: Underwear, Pants, Non-skid slipper socks Underwear - Performed by patient: Thread/unthread right underwear leg, Thread/unthread left underwear leg, Pull underwear up/down Underwear - Performed by helper: Thread/unthread right underwear leg, Pull underwear up/down Pants- Performed by patient: Thread/unthread left pants leg, Pull pants up/down Pants- Performed by helper: Thread/unthread right pants leg Non-skid slipper socks- Performed by patient: Don/doff right sock Non-skid slipper socks- Performed by helper: Don/doff right sock, Don/doff  left sock     Shoes - Performed by patient: Don/doff right shoe Shoes - Performed by helper: Don/doff left shoe          Lower body assist Assist for lower body dressing: (Mod assist)      Toileting Toileting   Toileting steps completed by patient: Adjust clothing prior to toileting, Performs  perineal hygiene, Adjust clothing after toileting Toileting steps completed by helper: Adjust clothing after toileting    Toileting assist Assist level: Touching or steadying assistance (Pt.75%)   Transfers Chair/bed transfer   Chair/bed transfer method: Stand pivot Chair/bed transfer assist level: Touching or steadying assistance (Pt > 75%) Chair/bed transfer assistive device: Armrests     Locomotion Ambulation     Max distance: 43 ft Assist level: Touching or steadying assistance (Pt > 75%)   Wheelchair   Type: Manual Max wheelchair distance: 60 ft Assist Level: Supervision or verbal cues  Cognition Comprehension Comprehension assist level: Follows basic conversation/direction with no assist  Expression Expression assist level: Expresses basic needs/ideas: With extra time/assistive device  Social Interaction Social Interaction assist level: Interacts appropriately with others with medication or extra time (anti-anxiety, antidepressant).  Problem Solving Problem solving assist level: Solves basic 75 - 89% of the time/requires cueing 10 - 24% of the time  Memory Memory assist level: Recognizes or recalls 90% of the time/requires cueing < 10% of the time     Medical Problem List and Plan:  1. 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 DVT Prophylaxis/Anticoagulation: SCDs  3. Pain Management: N/A  4. Mood: LCSW to follow for evaluation and support.  5. Neuropsych: This patient is capable of making decisions on his own behalf.  6. Skin/Wound Care: routine pressure relief measures.  7. Fluids/Electrolytes/Nutrition: Patient is pushing p.o. fluids   -BUN and creatinine with improvement to 36/1.6 today  -We will stop IV fluids and observe 8. HTN: Difficult to control PTA.     Hydralazine 10 mg every 8 when necessary   Vitals:   03/07/18 2041 03/08/18 0447  BP: (!)  172/58 (!) 176/60  Pulse: 70 72  Resp: 18 18  Temp: 98.1 F (36.7 C) 98.4 F (36.9 C)  SpO2: 95% 98%   some improvement with amlodipine, increase to 5 mg today  -lisinopril has been reduced to 5mg    Goal is to manage on one agent if possible 9. T2DM: Hgb A1C- 7.3. Check blood sugars before meals and at bedtime.   Patient had been on Glucophage 500 mg twice daily prior to admission   Monitor with increased mobility   CBG (last 3)  Recent Labs    03/07/18 1640 03/07/18 2120 03/08/18 0624  GLUCAP 101* 120* 113*  controlled 9/1         10. CKD: Baseline SCr 1.6. Encourage fluid intake. Monitor renal status with serial checks for stability.   -IV fluids stopped, see above.  Likely at baseline now 2D ECHO showed normal EF 11.  Hgb 10.9gm- baseline ~12 will check stool OB, no hematochezia  Extensive time spent today reviewing medical considerations and plan with patient and family.   LOS (Days) 9 A FACE TO FACE EVALUATION WAS PERFORMED   Meredith Staggers 03/08/2018 9:49 AM

## 2018-03-09 ENCOUNTER — Inpatient Hospital Stay (HOSPITAL_COMMUNITY): Payer: Medicare Other

## 2018-03-09 ENCOUNTER — Inpatient Hospital Stay (HOSPITAL_COMMUNITY): Payer: Medicare Other | Admitting: Occupational Therapy

## 2018-03-09 ENCOUNTER — Inpatient Hospital Stay (HOSPITAL_COMMUNITY): Payer: Medicare Other | Admitting: Physical Therapy

## 2018-03-09 LAB — GLUCOSE, CAPILLARY
GLUCOSE-CAPILLARY: 103 mg/dL — AB (ref 70–99)
GLUCOSE-CAPILLARY: 103 mg/dL — AB (ref 70–99)
GLUCOSE-CAPILLARY: 143 mg/dL — AB (ref 70–99)

## 2018-03-09 NOTE — Progress Notes (Signed)
Occupational Therapy Session Note  Patient Details  Name: [name] MRN: [id number] Date of Birth: 11/13/1945  Today's Date: 03/09/2018 OT Individual Time: 6503-5465 OT Individual Time Calculation (min): 53 min    Short Term Goals: Week 2:  OT Short Term Goal 1 (Week 2): Pt will don shoes with mod assist utilizing AE as needed OT Short Term Goal 2 (Week 2): Pt will complete UB dressing with min assist OT Short Term Goal 3 (Week 2): Pt will complete toilet transfer with min assist with RW OT Short Term Goal 4 (Week 2): Pt will maintain standing with min assist to complete 2 grooming tasks  Skilled Therapeutic Interventions/Progress Updates:    Pt received supine in bed agreeable to therapy, requesting therapy. Session focused on hemi b/d tasks, functional mobility, and ADL transfers. Pt used cane and min A to complete functional mobility into bathroom, onto toilet and into shower on shower chair. Pt able to complete 3/3 toileting tasks with CGA for standing balance. Pt completed UB/LB bathing sit <> stand in shower with vc throughout for attention to Lt. Pt returned to EOB and required mod A to don shirt. Pt able to thread B LE through pants with min A. Pt transferred to w/c and sat at sink performing grooming tasks with set up. Pt's BP following shower was 186/79 and pt asymptomatic. Pt left in room with daughter and all needs met.   Therapy Documentation Precautions:  Precautions Precautions: Fall Precaution Comments: Lt hemi Restrictions Weight Bearing Restrictions: No   Vital Signs: Therapy Vitals Pulse Rate: 72 Resp: 18 BP: (!) 182/68 Patient Position (if appropriate): Sitting Oxygen Therapy SpO2: 99 % O2 Device: Room Air Pain: Pain Assessment Pain Scale: 0-10 Pain Score: 0-No pain ADL: ADL ADL Comments: Please see functional navigator for ADL status  See Function Navigator for Current Functional Status.   Therapy/Group: Individual Therapy  Curtis Sites 03/09/2018, 2:27 PM

## 2018-03-09 NOTE — Progress Notes (Signed)
Occupational Therapy Session Note  Patient Details  Name: [name] MRN: [id number] Date of Birth: 1945/12/12  Today's Date: 03/09/2018 OT Individual Time: 8921-1941 OT Individual Time Calculation (min): 56 min    Short Term Goals: Week 2:  OT Short Term Goal 1 (Week 2): Pt will don shoes with mod assist utilizing AE as needed OT Short Term Goal 2 (Week 2): Pt will complete UB dressing with min assist OT Short Term Goal 3 (Week 2): Pt will complete toilet transfer with min assist with RW OT Short Term Goal 4 (Week 2): Pt will maintain standing with min assist to complete 2 grooming tasks  Skilled Therapeutic Interventions/Progress Updates:    Pt completed neuromuscular re-education for the LUE and trunk during session.  Pt rolled her wheelchair down to the therapy gym with min assist using BLEs and RUE.  She then completed transfer stand pivot to the therapy mat to start treatment.  Worked on LUE weightbearing tasks with dycem placed under left hand and 2" board.  Worked on activation of elbow extension for small movements of lateral weightshift to the left and then back to midline.  Progressed to coming down on the left forearm and returning to upright sitting with mod assist, with emphasis on using the LUE as much as possible and not the right trunk.  Progressed to working on reciprical and bilateral scooting with weight shifts forward and side to side.  Min assist for scooting the left side while maintaining the LUE in weightbearing.  Finished session with pt working on moveable chain of shoulder flexion and elbow extension by having pt place washcloth under her left hand on top of a slanted board and then having her push her hand up the board.  Min assist to decreased trunk compensation with activation of elbow extensors and shoulder flexors.  Finished session with return to the wheelchair stand pivot with min assist and then therapist pushing her back to the room.  Pt left in wheelchair  for lunch with call button and phone in reach and NT in to check CBG.    Therapy Documentation Precautions:  Precautions Precautions: Fall Precaution Comments: Lt hemi Restrictions Weight Bearing Restrictions: No  Pain: Pain Assessment Pain Scale: 0-10 Pain Score: 0-No pain ADL: See Function Navigator for Current Functional Status.   Therapy/Group: Individual Therapy  [name] Service OTR/L 03/09/2018, 12:36 PM

## 2018-03-09 NOTE — Progress Notes (Signed)
Physical Therapy Session Note  Patient Details  Name: [name] MRN: [id number] Date of Birth: 1946/06/24  Today's Date: 03/09/2018 PT Individual Time:  -      Short Term Goals: Week 2:  PT Short Term Goal 1 (Week 2): Pt will ambulate 75 ft with LRAD & supervision. PT Short Term Goal 2 (Week 2): Pt will complete car transfer with min assist.  PT Short Term Goal 3 (Week 2): Pt will negotiate 12 steps with rails and min assist.  Skilled Therapeutic Interventions/Progress Updates:  Pt received in w/c & agreeable to tx. No c/o pain reported but pt requesting to use restroom. Pt transported into bathroom via w/c total assist and pt completed stand pivot w/c<>toilet with grab bar and min assist with pt pulling underwear down over hips without assistance. Pt with continent void & BM on toilet, performing peri hygiene without assistance. Therapist assisted with pulling underwear over L hips and donning pants & shoes max assist for time management. Pt performed grooming tasks at sink (hand washing with set up assist, brush teeth with set up assist) from w/c level. Pt propelled w/c to gym with BLE with task focusing on reciprocal movement and LLE NMR & strengthening with cuing for technique LLE. Pt reports she does not like using RW, as she feels she focuses more on proper use of AD instead of quality of gait. Gait training without AD with min assist with manual facilitation for weight shifting R and cuing for increased step length & heel strike LLE with quality of gait decreasing as pt fatigues. BP assessed after each gait trial with RN made aware & administered BP meds & rest breaks taken to allow BP to decrease. Gait training with [facility] with therapist providing education and demonstrating proper use of AD with pt ambulating with device with min assist with mod cuing for gait sequence. Pt performed lateral walking at rail with multimodal cuing for posture and technique with pt using RUE for support and  task focusing on LLE strengthening & NMR. Gait training backwards at rail with min assist and RUE support with task focusing on L NMR with cuing to lift LLE instead of dragging/sliding foot. Pt propelled w/c back to room with BLE in same manner as noted above. At end of session pt left sitting in w/c in room with alarm donned & call needs in reach.  After initial walk: BP = 175/100 mmHg, HR = 78 bpm After rest break: 175/74 mmHg  After gait with SBQC: BP = 185/72 mmHg, HR = 80 bpm After rest break: BP = 173/70 mmHg, HR = 75 bpm   Educated pt on HHPT f/u.   Therapy Documentation Precautions:  Precautions Precautions: Fall Precaution Comments: Lt hemi Restrictions Weight Bearing Restrictions: No     See Function Navigator for Current Functional Status.   Therapy/Group: Individual Therapy  [name] 03/09/2018, 9:29 AM

## 2018-03-09 NOTE — Progress Notes (Signed)
[place] PHYSICAL MEDICINE & REHABILITATION     PROGRESS NOTE  Subjective/Complaints:   No issues overnite , pt feels ok today  ROS: Patient deniesCP, SOB, N/V/D Objective: Vital Signs: Blood pressure (!) 173/70, pulse 75, temperature 98.1 F (36.7 C), temperature source Oral, resp. rate 18, height 5' 3.25" (1.607 m), weight 69 kg, SpO2 98 %. No results found. No results for input(s): WBC, HGB, HCT, PLT in the last 72 hours. Recent Labs    03/08/18 0755  NA 140  K 4.0  CL 113*  GLUCOSE 147*  BUN 36*  CREATININE 1.60*  CALCIUM 8.8*   CBG (last 3)  Recent Labs    03/08/18 1647 03/08/18 2157 03/09/18 0652  GLUCAP 136* 120* 103*    Wt Readings from Last 3 Encounters:  03/04/18 69 kg  02/24/18 71.3 kg  02/24/18 71.4 kg    Physical Exam:  BP (!) 173/70 (BP Location: Left Arm)   Pulse 75   Temp 98.1 F (36.7 C) (Oral)   Resp 18   Ht 5' 3.25" (1.607 m)   Wt 69 kg   SpO2 98%   BMI 26.73 kg/m  Constitutional: No distress . Vital signs reviewed. HEENT: EOMI, oral membranes moist Neck: supple Cardiovascular: RRR without murmur. No JVD    Respiratory: CTA Bilaterally without wheezes or rales. Normal effort    GI: BS +, non-tender, non-distended  Musculoskeletal: No edema or tenderness in extremities Neurological: She is alert and oriented Motor: LUE 3/5 prox to distal (stable) LLE 4/5 prox to distal (stable) RUE/RLE 5/5 proximal to distal.  Skin: Skin is warm. Intact. Psychiatric: Pleasant and cooperative.   Assessment/Plan: 1. 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 Physiatrist is providing close team supervision and 24 hour management of active medical problems listed below. Physiatrist and rehab team continue to assess barriers to discharge/monitor patient progress toward functional and  medical goals.  Function:  Bathing Bathing position   Position: Shower  Bathing parts Body parts bathed by patient: Chest, Abdomen, Front perineal area, Right upper leg, Left upper leg, Left arm, Right lower leg, Left lower leg Body parts bathed by helper: Right arm, Back, Buttocks  Bathing assist Assist Level: Touching or steadying assistance(Pt > 75%)      Upper Body Dressing/Undressing Upper body dressing   What is the patient wearing?: Pull over shirt/dress     Pull over shirt/dress - Perfomed by patient: Put head through opening Pull over shirt/dress - Perfomed by helper: Thread/unthread left sleeve, Pull shirt over trunk, Thread/unthread right sleeve(long sleeved shirt)        Upper body assist Assist Level: (Max assist)      Lower Body Dressing/Undressing Lower body dressing   What is the patient wearing?: Underwear, Pants, Non-skid slipper socks Underwear - Performed by patient: Thread/unthread right underwear leg, Thread/unthread left underwear leg, Pull underwear up/down Underwear - Performed by helper: Thread/unthread right underwear leg, Pull underwear up/down Pants- Performed by patient: Thread/unthread left pants leg, Pull pants up/down Pants- Performed by helper: Thread/unthread right pants leg Non-skid slipper socks- Performed by patient: Don/doff right sock Non-skid slipper socks- Performed by helper: Don/doff right sock, Don/doff left sock     Shoes - Performed by patient: Don/doff right shoe Shoes - Performed by helper: Don/doff left shoe          Lower body assist Assist for lower  body dressing: (Mod assist)      Toileting Toileting   Toileting steps completed by patient: Adjust clothing prior to toileting, Performs perineal hygiene, Adjust clothing after toileting Toileting steps completed by helper: Adjust clothing after toileting    Toileting assist Assist level: Touching or steadying assistance (Pt.75%)   Transfers Chair/bed transfer    Chair/bed transfer method: Stand pivot Chair/bed transfer assist level: Touching or steadying assistance (Pt > 75%) Chair/bed transfer assistive device: Armrests     Locomotion Ambulation     Max distance: 100 ft Assist level: Touching or steadying assistance (Pt > 75%)   Wheelchair   Type: Manual Max wheelchair distance: 100 ft(BLE) Assist Level: Supervision or verbal cues  Cognition Comprehension Comprehension assist level: Follows basic conversation/direction with no assist  Expression Expression assist level: Expresses basic needs/ideas: With extra time/assistive device  Social Interaction Social Interaction assist level: Interacts appropriately with others with medication or extra time (anti-anxiety, antidepressant).  Problem Solving Problem solving assist level: Solves basic 75 - 89% of the time/requires cueing 10 - 24% of the time  Memory Memory assist level: Recognizes or recalls 90% of the time/requires cueing < 10% of the time     Medical Problem List and Plan:  1. 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 DVT Prophylaxis/Anticoagulation: SCDs  3. Pain Management: N/A  4. Mood: LCSW to follow for evaluation and support.  5. Neuropsych: This patient is capable of making decisions on his own behalf.  6. Skin/Wound Care: routine pressure relief measures.  7. Fluids/Electrolytes/Nutrition: Patient is pushing p.o. fluids   -BUN and creatinine with improvement to 36/1.6 today  -off IV fluids and observe, recheck BMET on Wednesday 8. HTN: Difficult to control PTA.     Hydralazine 10 mg every 8 when necessary   Vitals:   03/09/18 0851 03/09/18 0856  BP: (!) 185/72 (!) 173/70  Pulse: 80 75  Resp:    Temp:    SpO2:     some improvement with amlodipine, increase to 5 mg 9/2  -lisinopril has been reduced to 5mg    Goal is to manage on amlodipine 9. T2DM: Hgb  A1C- 7.3. Check blood sugars before meals and at bedtime.   Patient had been on Glucophage 500 mg twice daily prior to admission   Monitor with increased mobility   CBG (last 3)  Recent Labs    03/08/18 1647 03/08/18 2157 03/09/18 0652  GLUCAP 136* 120* 103*  controlled 9/2         10. CKD: Baseline SCr 1.6. Encourage fluid intake. Monitor renal status with serial checks for stability.   -IV fluids stopped,at baseline now  11.  Hgb 10.9gm- baseline ~12 ,  stool OB-, no hematochezia     LOS (Days) 10 A FACE TO FACE EVALUATION WAS PERFORMED   Charlett Blake 03/09/2018 10:27 AM

## 2018-03-10 ENCOUNTER — Inpatient Hospital Stay (HOSPITAL_COMMUNITY): Payer: Medicare Other | Admitting: Physical Therapy

## 2018-03-10 ENCOUNTER — Inpatient Hospital Stay (HOSPITAL_COMMUNITY): Payer: Medicare Other | Admitting: Occupational Therapy

## 2018-03-10 LAB — GLUCOSE, CAPILLARY
GLUCOSE-CAPILLARY: 109 mg/dL — AB (ref 70–99)
GLUCOSE-CAPILLARY: 122 mg/dL — AB (ref 70–99)
GLUCOSE-CAPILLARY: 139 mg/dL — AB (ref 70–99)
Glucose-Capillary: 95 mg/dL (ref 70–99)
Glucose-Capillary: 113 mg/dL — ABNORMAL HIGH (ref 70–99)

## 2018-03-10 NOTE — Progress Notes (Addendum)
Physical Therapy Session Note  Patient Details  Name: [name] MRN: [id number] Date of Birth: May 29, 1946  Today's Date: 03/10/2018 PT Individual Time: [phone] and 1410-1452 PT Individual Time Calculation (min): 55 min and 42 min   Short Term Goals: Week 2:  PT Short Term Goal 1 (Week 2): Pt will ambulate 75 ft with LRAD & supervision. PT Short Term Goal 2 (Week 2): Pt will complete car transfer with min assist.  PT Short Term Goal 3 (Week 2): Pt will negotiate 12 steps with rails and min assist.  Skilled Therapeutic Interventions/Progress Updates:  Treatment 1: Pt received in w/c & agreeable to tx. No c/o pain reported. Pt propels w/c room<>gym with BLE and supervision with cuing for increased step LLE and L attention with task focusing on L NMR & strengthening. Gait training x 100 ft + 100 ft with SBQC and min assist with multimodal cuing for upright posture and cuing for sequencing with quality decreasing as pt fatigues & pt requiring ongoing cuing. Provided pt with L [employer] sling for LUE support during gait & adjusted sling for improved positioning; educated pt on ability to purchase personal sling. Pt requires cuing for sequencing to turn, UE placement, and to sit slowly in w/c as pt demonstrates impaired safety awareness & eccentric control. Pt negotiated 4 steps + 4 steps with L rail laterally with RUE support with min assist and cuing for foot placement and sequencing of task with rest break in between. Pt continues to demonstrate decreased endurance, requiring frequent seated rest breaks. Pt completes stand pivot w/c>toilet with steady assist and use of grab bar and pulls brief & pants down below hips with steady assist for standing balance. At end of session pt left sitting on toilet in handoff to OT.  Treatment 2: Pt received in w/c & agreeable to tx. No c/o pain reported. Pt propelled w/c room>dayroom and nurses station>room with BLE with task focusing on LLE NMR & strengthening.  Pt completes stand pivot w/c<>nu-step with min assist with decreased awareness of proper hand placement as she attempts to hold nu-step armrest with RUE despite crossing over her body. Pt utilized nu-step on level 3 x 8 minutes with BLE & RUE with task focusing on endurance training; pt reported 7-8/10 RPE. Educated pt on risk factors and signs/symptoms to be aware of if ever experiencing another stroke with pt reporting understanding, encouraged pt to consume healthy diet & take all medications as prescribed by MD. Pt reports need to use restroom and completes stand pivot w/c<>toilet with grab bar and steady assist, pulling pants below hips without assistance but therapist providing assistance to pull them over hips when finished for time management. Pt completes sit<>stand from toilet with steady assist & grab bar. After continent void pt performs peri hygiene with supervision and hand hygiene at sink with set up assist (place w/c & to turn off faucet). Transported pt outside total assist for time management. Gait training outside over uneven brick with min assist and SBQC and increasing to max cuing for gait pattern & sequencing with AD. Pt demonstrates decreased weight shifting & impaired foot clearance but demonstrates impaired awareness & ability to correct even with ongoing education & cuing. At end of session pt left sitting in w/c in room with alarm donned & all needs in reach.  Therapy Documentation Precautions:  Precautions Precautions: Fall Precaution Comments: Lt hemi Restrictions Weight Bearing Restrictions: No    See Function Navigator for Current Functional Status.   Therapy/Group:  Individual Therapy  [name] 03/10/2018, 3:56 PM

## 2018-03-10 NOTE — Progress Notes (Signed)
[place] PHYSICAL MEDICINE & REHABILITATION     PROGRESS NOTE  Subjective/Complaints:   Discussed BP meds with pt, amlodipine just increased yesterday, want to come off lisinopril iff possible  ROS: Patient deniesCP, SOB, N/V/D Objective: Vital Signs: Blood pressure (!) 176/60, pulse 75, temperature 98.7 F (37.1 C), temperature source Oral, resp. rate 18, height 5' 3.25" (1.607 m), weight 69 kg, SpO2 96 %. No results found. No results for input(s): WBC, HGB, HCT, PLT in the last 72 hours. Recent Labs    03/08/18 0755  NA 140  K 4.0  CL 113*  GLUCOSE 147*  BUN 36*  CREATININE 1.60*  CALCIUM 8.8*   CBG (last 3)  Recent Labs    03/09/18 2118 03/10/18 0615 03/10/18 1139  GLUCAP 143* 122* 95    Wt Readings from Last 3 Encounters:  03/04/18 69 kg  02/24/18 71.3 kg  02/24/18 71.4 kg    Physical Exam:  BP (!) 176/60 (BP Location: Right Arm)   Pulse 75   Temp 98.7 F (37.1 C) (Oral)   Resp 18   Ht 5' 3.25" (1.607 m)   Wt 69 kg   SpO2 96%   BMI 26.73 kg/m  Constitutional: No distress . Vital signs reviewed. HEENT: EOMI, oral membranes moist Neck: supple Cardiovascular: RRR without murmur. No JVD    Respiratory: CTA Bilaterally without wheezes or rales. Normal effort    GI: BS +, non-tender, non-distended  Musculoskeletal: No edema or tenderness in extremities Neurological: She is alert and oriented Motor: LUE 3/5 prox to distal (stable) LLE 4/5 prox to distal (stable) RUE/RLE 5/5 proximal to distal.  Skin: Skin is warm. Intact. Psychiatric: Pleasant and cooperative.   Assessment/Plan: 1. 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 Physiatrist is providing close team supervision and 24 hour management of active medical problems listed below. Physiatrist and rehab team continue to assess barriers  to discharge/monitor patient progress toward functional and medical goals.  Function:  Bathing Bathing position   Position: Shower  Bathing parts Body parts bathed by patient: Chest, Abdomen, Front perineal area, Right upper leg, Left upper leg, Left arm, Right lower leg, Left lower leg, Right arm, Buttocks Body parts bathed by helper: Back  Bathing assist Assist Level: Touching or steadying assistance(Pt > 75%)      Upper Body Dressing/Undressing Upper body dressing   What is the patient wearing?: Pull over shirt/dress     Pull over shirt/dress - Perfomed by patient: Put head through opening, Thread/unthread left sleeve Pull over shirt/dress - Perfomed by helper: Thread/unthread right sleeve        Upper body assist Assist Level: (mod A)      Lower Body Dressing/Undressing Lower body dressing   What is the patient wearing?: Underwear, Pants Underwear - Performed by patient: Thread/unthread left underwear leg, Pull underwear up/down Underwear - Performed by helper: Thread/unthread right underwear leg Pants- Performed by patient: Thread/unthread right pants leg, Thread/unthread left pants leg, Pull pants up/down Pants- Performed by helper: Thread/unthread right pants leg Non-skid slipper socks- Performed by patient: Don/doff right sock Non-skid slipper socks- Performed by helper: Don/doff right sock, Don/doff left sock     Shoes - Performed by patient: Don/doff right shoe Shoes - Performed by helper: Don/doff left shoe          Lower body assist Assist for lower body dressing: Touching or  steadying assistance (Pt > 75%)      Toileting Toileting   Toileting steps completed by patient: Adjust clothing prior to toileting, Performs perineal hygiene, Adjust clothing after toileting Toileting steps completed by helper: Adjust clothing after toileting    Toileting assist Assist level: Touching or steadying assistance (Pt.75%)   Transfers Chair/bed transfer   Chair/bed  transfer method: Stand pivot Chair/bed transfer assist level: Touching or steadying assistance (Pt > 75%) Chair/bed transfer assistive device: Armrests, Cane     Locomotion Ambulation     Max distance: 100 ft  Assist level: Touching or steadying assistance (Pt > 75%)   Wheelchair   Type: Manual Max wheelchair distance: 125 ft  Assist Level: Supervision or verbal cues  Cognition Comprehension Comprehension assist level: Follows basic conversation/direction with no assist  Expression Expression assist level: Expresses basic needs/ideas: With no assist  Social Interaction Social Interaction assist level: Interacts appropriately with others - No medications needed.  Problem Solving Problem solving assist level: Solves basic 90% of the time/requires cueing < 10% of the time  Memory Memory assist level: Complete Independence: No helper     Medical Problem List and Plan:  1. 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 DVT Prophylaxis/Anticoagulation: SCDs  3. Pain Management: N/A  4. Mood: LCSW to follow for evaluation and support.  5. Neuropsych: This patient is capable of making decisions on his own behalf.  6. Skin/Wound Care: routine pressure relief measures.  7. Fluids/Electrolytes/Nutrition: Patient is pushing p.o. fluids   -BUN and creatinine with improvement to 36/1.6 , looks to be near baseline  -off IV fluids and observe, recheck BMET on Wednesday 8. HTN: Difficult to control PTA.     Hydralazine 10 mg every 8 when necessary   Vitals:   03/09/18 1918 03/10/18 0435  BP: (!) 158/55 (!) 176/60  Pulse: 78 75  Resp: 18 18  Temp: 97.9 F (36.6 C) 98.7 F (37.1 C)  SpO2: 96% 96%   some improvement with amlodipine, increase to 5 mg 9/2, mainly systolic elevation  -lisinopril has been reduced to 5mg    Goal is to manage on amlodipine 9. T2DM: Hgb A1C-  7.3. Check blood sugars before meals and at bedtime.   Patient had been on Glucophage 500 mg twice daily prior to admission   Monitor with increased mobility   CBG (last 3)  Recent Labs    03/09/18 2118 03/10/18 0615 03/10/18 1139  GLUCAP 143* 122* 95  controlled 9/3         10. CKD: Baseline SCr 1.6. Encourage fluid intake. Monitor renal status with serial checks for stability.   -IV fluids stopped,at baseline now  11.  Hgb 10.9gm- baseline ~12 ,  stool OB-, no hematochezia     LOS (Days) 11 A FACE TO FACE EVALUATION WAS PERFORMED   Charlett Blake 03/10/2018 1:25 PM

## 2018-03-10 NOTE — Progress Notes (Signed)
Occupational Therapy Session Note  Patient Details  Name: [name] MRN: [id number] Date of Birth: 04-Jul-1946  Today's Date: 03/10/2018 OT Individual Time: 0902-1000 and 6063-0160 OT Individual Time Calculation (min): 58 min and 27 min   Short Term Goals: Week 2:  OT Short Term Goal 1 (Week 2): Pt will don shoes with mod assist utilizing AE as needed OT Short Term Goal 2 (Week 2): Pt will complete UB dressing with min assist OT Short Term Goal 3 (Week 2): Pt will complete toilet transfer with min assist with RW OT Short Term Goal 4 (Week 2): Pt will maintain standing with min assist to complete 2 grooming tasks  Skilled Therapeutic Interventions/Progress Updates:    1)Treatment session with focus on LUE NMR and functional transfers.  Pt received on toilet after handoff from PT.  Pt completed toileting tasks with min assist for standing balance.  Completed stand pivot transfers throughout session with min assist and min cues for sequencing and safety as pt attempts to sit prematurely.  Engaged in [place] in sitting with focus on elbow extension and wrist extension, with pt able to elicit increased extension during reaching activity this session.  Pt noticeably fatigued this AM requiring frequent rest breaks and increased time to accomplish tasks.  Applied NMES to forearm to facilitate wrist and finger extension. Pt motivated to see finger extension.  Ratio 1:3 Rate 35 pps Waveform- Asymmetric Ramp 1.0 Pulse 300 Intensity- 23 Duration -   10   No adverse reactions after treatment and is skin intact.    2) Treatment session with focus on LUE NMR.  Pt received upright in w/c with no c/o pain.  Engaged in [place] in sitting with focus on shoulder flexion, protraction, and retraction as well as supination/pronation.  Utilized tray table in sitting to facilitate shoulder movements with focus on motor control as pt will "sling" arm forward and side to side, multimodal cues to increase  motor control.  Pt unable to complete supination/pronation on table top, therefore transitioned to attempting movement from lap with improved ability.  Challenged pt to complete thumb opposition with pt able to complete thumb flexion/extension but no active finger movements.    Therapy Documentation Precautions:  Precautions Precautions: Fall Precaution Comments: Lt hemi Restrictions Weight Bearing Restrictions: No General:   Vital Signs: Therapy Vitals Temp: 98 F (36.7 C) Temp Source: Oral Pulse Rate: 77 Resp: 18 BP: (!) 155/64 Patient Position (if appropriate): Sitting Oxygen Therapy SpO2: 98 % O2 Device: Room Air Pain:  Pt with no c/o pain  See Function Navigator for Current Functional Status.   Therapy/Group: Individual Therapy  [name] 03/10/2018, 3:23 PM

## 2018-03-10 NOTE — Plan of Care (Signed)
  Problem: Consults Goal: RH STROKE PATIENT EDUCATION Description See Patient Education module for education specifics  Outcome: Progressing   Problem: RH SKIN INTEGRITY Goal: RH STG SKIN FREE OF INFECTION/BREAKDOWN Description With cues/reminders  Outcome: Progressing   Problem: RH SAFETY Goal: RH STG ADHERE TO SAFETY PRECAUTIONS W/ASSISTANCE/DEVICE Description STG Adhere to Safety Precautions With cues/reminders Assistance/Device.  Outcome: Progressing   Problem: RH KNOWLEDGE DEFICIT Goal: RH STG INCREASE KNOWLEDGE OF DIABETES Description Pt will be able to explain management of DM using handouts/education with cues/reminders  Outcome: Progressing Goal: RH STG INCREASE KNOWLEDGE OF HYPERTENSION Description Pt will be able to manage HTN using handouts, booklets with cues/reminders  Outcome: Progressing

## 2018-03-11 ENCOUNTER — Encounter: Payer: Self-pay | Admitting: Family Medicine

## 2018-03-11 ENCOUNTER — Inpatient Hospital Stay (HOSPITAL_COMMUNITY): Payer: Medicare Other | Admitting: Occupational Therapy

## 2018-03-11 ENCOUNTER — Inpatient Hospital Stay (HOSPITAL_COMMUNITY): Payer: Medicare Other | Admitting: Physical Therapy

## 2018-03-11 ENCOUNTER — Inpatient Hospital Stay (HOSPITAL_COMMUNITY): Payer: Medicare Other

## 2018-03-11 LAB — CBC
HCT: 32.6 % — ABNORMAL LOW (ref 36.0–46.0)
Hemoglobin: 10.7 g/dL — ABNORMAL LOW (ref 12.0–15.0)
MCH: 27.6 pg (ref 26.0–34.0)
MCHC: 32.8 g/dL (ref 30.0–36.0)
MCV: 84.2 fL (ref 78.0–100.0)
PLATELETS: 264 10*3/uL (ref 150–400)
RBC: 3.87 MIL/uL (ref 3.87–5.11)
RDW: 13.3 % (ref 11.5–15.5)
WBC: 8.3 10*3/uL (ref 4.0–10.5)

## 2018-03-11 LAB — BASIC METABOLIC PANEL
Anion gap: 8 (ref 5–15)
BUN: 37 mg/dL — AB (ref 8–23)
CO2: 18 mmol/L — ABNORMAL LOW (ref 22–32)
CREATININE: 1.62 mg/dL — AB (ref 0.44–1.00)
Calcium: 8.9 mg/dL (ref 8.9–10.3)
Chloride: 115 mmol/L — ABNORMAL HIGH (ref 98–111)
GFR calc Af Amer: 36 mL/min — ABNORMAL LOW (ref >60)
GFR calc non Af Amer: 31 mL/min — ABNORMAL LOW (ref >60)
Glucose, Bld: 138 mg/dL — ABNORMAL HIGH (ref 70–99)
Potassium: 4.1 mmol/L (ref 3.5–5.1)
SODIUM: 141 mmol/L (ref 135–145)

## 2018-03-11 LAB — GLUCOSE, CAPILLARY
GLUCOSE-CAPILLARY: 111 mg/dL — AB (ref 70–99)
Glucose-Capillary: 120 mg/dL — ABNORMAL HIGH (ref 70–99)
Glucose-Capillary: 115 mg/dL — ABNORMAL HIGH (ref 70–99)
Glucose-Capillary: 218 mg/dL — ABNORMAL HIGH (ref 70–99)

## 2018-03-11 MED ORDER — AMLODIPINE BESYLATE 10 MG PO TABS
10.0000 mg | ORAL_TABLET | Freq: Every day | ORAL | Status: DC
  Administered 2018-03-12 – 2018-03-14 (×3): 10 mg via ORAL
  Filled 2018-03-11 (×3): qty 1

## 2018-03-11 NOTE — Progress Notes (Signed)
Recreational Therapy Session Note  Patient Details  Name: [name] MRN: [id number] Date of Birth: 1946-06-02 Today's Date: 03/11/2018 Time:  1330-1350  Pain: no c/o Goal:  After discussion, pt will perform simple activity analysis & identify adaptations that could be made for 2 leisure activities for participation at discharge with min cues.  Skilled Therapeutic Interventions/Progress Updates: session focused on educating pt on activity analysis and identifying adaptations that would allow pt to participate in preferred leisure tasks at home.  Examples given and pt able to perform analysis with mod I.  Pt identified 3 tasks with modifications.  Pt is anxious to return home and motivated to regain further independence.  Pt also with questions about discharge and follow up therapies.  Reviewed team conference note with pt.   Therapy/Group: Individual Therapy Parvin Stetzer 03/11/2018, 3:41 PM

## 2018-03-11 NOTE — Progress Notes (Signed)
Social Work Patient ID: [name], female   DOB: September 24, 1945, 72 y.o.   MRN: [id number]  Met with pt and spoke with daughter-[name] via telephone to discuss team conference goals supervision level and discharge 9/11. Pt really wants to get home sooner than the 11th. Discussed scheduling family education this week and if it goes well can work on discharge Sat and if not then will stay until the 11th. Pt and daughter are in agreement with this plan. Have scheduled family education for Friday from 9:0011:00 with daughter's and pt's husband.

## 2018-03-11 NOTE — Progress Notes (Signed)
Physical Therapy Session Note  Patient Details  Name: [name] MRN: [id number] Date of Birth: 1945/10/11  Today's Date: 03/11/2018 PT Individual Time: 0805-0916 PT Individual Time Calculation (min): 71 min   Short Term Goals: Week 2:  PT Short Term Goal 1 (Week 2): Pt will ambulate 75 ft with LRAD & supervision. PT Short Term Goal 2 (Week 2): Pt will complete car transfer with min assist.  PT Short Term Goal 3 (Week 2): Pt will negotiate 12 steps with rails and min assist.  Skilled Therapeutic Interventions/Progress Updates:  Pt received in w/c & agreeable to tx. No c/o pain reported. Pt propelled w/c with BLE with task focusing on LLE strengthening & NMR with ongoing cuing for LLE foot advancement. Gait training with [facility] with steady/min assist 2/2 frequent LOB due to impaired weight shifting & LLE impaired foot clearance. Transitioned to trialing [facility] with therapist providing education regarding proper use of AD. Pt ambulates 100 ft x 2 with SPC & close supervision occasional steady assist with max education regarding need to decrease gait speed and to focus on increasing L foot clearance and heel strike with pt slowly able to return demonstrate. Pt reports she favors SPC vs SBQC. Pt performs L lateral step ups on 6" step with RUE support and min assist with task focusing on LLE strengthening & NMR & dynamic balance. Pt reports need to use restroom and returns to room and completes stand pivot w/c<>toilet with cuing to lock w/c brakes before transferring, grab bar with steady assist<>supervision. Pt with continent void on toilet and performed peri hygiene without assistance. Pt performed hand hygiene at sink from w/c level with set up assist. Educated daughter [name]) on [employer] sling use and how to don sling. Back in gym pt performed 3" step taps with min assist for balance & task focusing on weight shifting L<>R, LLE coordination & NMR & dynamic balance; therapist provides cuing for increasd  hip flexion vs compensatory movements. At end of session pt left sitting in w/c in room with all needs in reach & chair alarm set.  Pt continues to demonstrate impaired anticipatory awareness throughout session. Therapist provides ongoing education for need to increase safety with all movements even if she has to reduce speed at which she performs them.  Therapy Documentation Precautions:  Precautions Precautions: Fall Precaution Comments: Lt hemi Restrictions Weight Bearing Restrictions: No  Vital Signs: After gait trials: BP = 165/82 mmHg (RUE), sitting HR = 90 bpm    See Function Navigator for Current Functional Status.   Therapy/Group: Individual Therapy  [name] 03/11/2018, 9:17 AM

## 2018-03-11 NOTE — Progress Notes (Signed)
Occupational Therapy Session Note  Patient Details  Name: [name] MRN: [id number] Date of Birth: 1945/11/18  Today's Date: 03/11/2018 OT Individual Time: 1100-1200 and 1347-1430 OT Individual Time Calculation (min): 60 min and 43 min   Short Term Goals: Week 2:  OT Short Term Goal 1 (Week 2): Pt will don shoes with mod assist utilizing AE as needed OT Short Term Goal 2 (Week 2): Pt will complete UB dressing with min assist OT Short Term Goal 3 (Week 2): Pt will complete toilet transfer with min assist with RW OT Short Term Goal 4 (Week 2): Pt will maintain standing with min assist to complete 2 grooming tasks  Skilled Therapeutic Interventions/Progress Updates:    1) Treatment session with focus on ADL retraining with dressing and functional transfers.  Pt received upright in w/c asking about how conference went and if she can d/c home sooner.  Discussed current goals of supervision and recommendation to keep original d/c date on 9/11 due to still requiring min assist with self-care tasks and transfers/ambulation.  Engaged in dressing tasks with pt able to complete UB dressing with supervision with increased time and cues for hemi-dressing technique.  Pt required contact guard when standing to pull pants over hips.  Increased time to don Rt sock with use of only Rt hand.  Ambulated to toilet with SPC with min assist and cues for stepping strategy with LLE.  Contact guard during clothing management pre and post toileting.  Engaged in walk-in shower transfers with 3" shower ledge, therapist providing cues for sequencing when stepping over ledge with SPC.  Completed transfer x2 with min assist - recommend min assist for shower transfers and supervision during bathing/dressing to which pt reports understanding.  Engaged in [place] in sitting with focus on elbow extension and motor control during reaching activity.  Pt able to slide items along table with focus on elbow extension.  Returned to  room and left upright in w/c with all needs in reach and lunch tray set up.    Pt continues to demonstrate decreased safety awareness as she requires cues to lock w/c brakes with every transfer and sit > stand.    Pt also asking at end of session what d/c plan was, despite lengthy conversation at beginning of session with recommendation to keep original d/c date.  2) After discussion with SWK and primary PT, plan to complete family education on 9/6 with potential for d/c on 9/7 based on how well education goes.  Treatment session with focus on functional transfers and LUE NMR.  Pt received upright in w/c reporting need to toilet.  Ambulated to toilet with SPC with min assist.  Pt able to complete toileting tasks with contact guard.  Pt propelled w/c with BLE for strengthening to therapy gym.  Engaged in [place] in sitting with focus on shoulder flexion and elbow extension with reaching activity.  Pt reaching away from body as well as across midline to promote improved elbow extension.  Cues to visually attend to LUE to increase motor control and precision during reaching activity.  Pt continues to demonstrate flexor synergy posture during mobility and at rest, requiring cues to attend to LUE to come out of synergy.  Returned to room and transferred back to bed with min guard stand pivot transfer.  Therapy Documentation Precautions:  Precautions Precautions: Fall Precaution Comments: Lt hemi Restrictions Weight Bearing Restrictions: No Pain: Pain Assessment Pain Score: 0-No pain Other Treatments:    See Function  Navigator for Current Functional Status.   Therapy/Group: Individual Therapy  [name] 03/11/2018, 2:10 PM

## 2018-03-11 NOTE — Patient Care Conference (Signed)
Inpatient RehabilitationTeam Conference and Plan of Care Update Date: 03/11/2018   Time: 10:45 AM    Patient Name: [name]      Medical Record Number: [id number]  Date of Birth: [birth year] Sex: Female         Room/Bed: 4W25C/4W25C-01 Payor Info: Payor: [employer] MEDICARE / Plan: [employer] MEDICARE / Product Type: *No Product type* /    Admitting Diagnosis: cva  Admit Date/Time:  02/27/2018 12:24 PM Admission Comments: No comment available   Primary Diagnosis:  <principal problem not specified> Principal Problem: <principal problem not specified>  Patient Active Problem List   Diagnosis Date Noted  . Chronic kidney disease (CKD), stage III (moderate) (HCC)   . Hypertensive crisis   . Embolic stroke ([place]) 85/63/1497  . Left hemiparesis ([place])   . Diabetes mellitus type 2 in nonobese (HCC)   . Acute ischemic stroke ([place])   . Left arm weakness   . Hypertensive urgency 02/24/2018  . Acute CVA (cerebrovascular accident) ([place]) 02/24/2018  . 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 Weakness of left upper extremity 02/24/2018  . Overweight (BMI 25.0-29.9) 02/24/2018  . CKD (chronic kidney disease), stage III ([place]) 02/24/2018  . Diabetes mellitus without complication ([place]) 02/63/7858  . HTN (hypertension) 09/02/2017  . Fall at home, initial encounter 09/02/2017  . Knee pain, bilateral 09/02/2017  . Weakness 09/02/2017    Expected Discharge Date: Expected Discharge Date: 03/18/18  Team Members Present: Physician leading conference: Dr. Alysia [name] Social Worker Present: [name] Kin, LCSW Nurse Present: [name] Ch[place], RN OT Present: [name], OT SLP Present: [name], SLP PPS Coordinator present : [name] Nakayama, RN, CRRN     Current Status/Progress Goal Weekly Team Focus  Medical   Blood pressure still mildly labile, transitioning from ACE inhibitor to calcium channel blocker  To medical stability reduce fall risk, manage blood pressure   Will discontinue lisinopril and increased dose of amlodipine   Bowel/Bladder   Continent of bowel and bladder; LBM 03/10/18  Remain continent of bowel and bladder  Assess bowel and bladder needs q shift and PRN and assist as needed   Swallow/Nutrition/ Hydration             ADL's   min assist bathing, mod assist UB dressing, min-mod assist LB dressing, min assist stand pivot transfers and sit <> stand.  Pt wants to d/c home sooner than original date, if so will need to downgrade majority of goals to min assist  supervision  ADL retraining, transfers, dynamic balance, Lt NMR, Lt attention.   Mobility   supervision w/c mobility, min/mod assist transfers, min assist short distance gait with [facility]  supervision overall with LRAD  L NMR, transfers, gait, balance, pt/family education, stairs, endurance   Communication             Safety/Cognition/ Behavioral Observations            Pain   No complaints of pain  Pain </=2/10  Assess pain q shift and PRN and medicate as necessary   Skin   Skin tear on Left forearm almost completely healed; no other skin issues  Remain free of infection and breakdown  Assess skin q shift and PRN      *See Care Plan and progress notes for long and short-term goals.     Barriers to Discharge  Current Status/Progress Possible Resolutions Date Resolved   Physician    Medical stability     Progressing towards goals  May be able to move up discharge date, see above      Nursing                  PT                    OT                  SLP                SW                Discharge Planning/Teaching Needs:  Will have husband [name] in to observe in therapies, daughter very involved and stays the night with pt. Working on discharge needs      Team Discussion:  Progressing toward her goals of supervision. Currently min assist level. Pt wants to go home sooner than 9/11. Will need family education to see if can manage care at current level. MD working on BP  and hopes to just have one medicine for DC home. Continue to work on awareness and foot drag. Wld benefit from staying until 9/11. Will schedule family education and see if DC can be moved up.  Revisions to Treatment Plan:  Possible move up DC date after family education    Continued Need for Acute Rehabilitation Level of Care: The patient requires daily medical management by a physician with specialized training in physical medicine and rehabilitation for the following conditions: Daily direction of a multidisciplinary physical rehabilitation program to ensure safe treatment while eliciting the highest outcome that is of practical value to the patient.: Yes Daily medical management of patient stability for increased activity during participation in an intensive rehabilitation regime.: Yes Daily analysis of laboratory values and/or radiology reports with any subsequent need for medication adjustment of medical intervention for : Neurological problems;Blood pressure problems   I attest that I was present, lead the team conference, and concur with the assessment and plan of the team. Dr. Alysia [name]  [name] 03/11/2018, 1:27 PM

## 2018-03-11 NOTE — Progress Notes (Addendum)
Physical Therapy Session Note  Patient Details  Name: [name] MRN: [id number] Date of Birth: [birth year]  Today's Date: 03/11/2018 PT Individual Time: 1300-1330 PT Individual Time Calculation (min): 30 min   Short Term Goals:  Week 2:  PT Short Term Goal 1 (Week 2): Pt will ambulate 75 ft with LRAD & supervision. PT Short Term Goal 2 (Week 2): Pt will complete car transfer with min assist.  PT Short Term Goal 3 (Week 2): Pt will negotiate 12 steps with rails and min assist.  Skilled Therapeutic Interventions/Progress Updates:   Therapeutic activity in standing with RUE support, kicking cones with L foot to promote speed and power and ankle DF with good result, and kicking with R foot to promote L hip and knee stance stability, without knee bucking.  neuromuscular re-education via demo and multimodal cues for L pelvic activation to facilittate pelvic dissociation.  Reciprocal scooting forward/backward in w/c with assistance, x 3.  Pt has greater difficulty moving forward than backward.  Pt left resting in w/c with all needs within reach.     Therapy Documentation Precautions:  Precautions Precautions: Fall Precaution Comments: Lt hemi Restrictions Weight Bearing Restrictions: No  Pain: Pain Assessment Pain Score: 0-No pain      See Function Navigator for Current Functional Status.   Therapy/Group: Individual Therapy  Joyleen Haselton 03/11/2018, 1:29 PM

## 2018-03-11 NOTE — Progress Notes (Signed)
[facility] PHYSICAL MEDICINE & REHABILITATION     PROGRESS NOTE  Subjective/Complaints:   Blood pressure mildly labile, labs reviewed, stable renal status  ROS: Patient deniesCP, SOB, N/V/D Objective: Vital Signs: Blood pressure (!) 165/82, pulse 90, temperature 98.3 F (36.8 C), temperature source Oral, resp. rate 18, height 5' 3.25" (1.607 m), weight 69.1 kg, SpO2 95 %. No results found. Recent Labs    03/11/18 0520  WBC 8.3  HGB 10.7*  HCT 32.6*  PLT 264   Recent Labs    03/11/18 0520  NA 141  K 4.1  CL 115*  GLUCOSE 138*  BUN 37*  CREATININE 1.62*  CALCIUM 8.9   CBG (last 3)  Recent Labs    03/10/18 2105 03/11/18 0634 03/11/18 1209  GLUCAP 139* 120* 111*    Wt Readings from Last 3 Encounters:  03/11/18 69.1 kg  02/24/18 71.3 kg  02/24/18 71.4 kg    Physical Exam:  BP (!) 165/82 (BP Location: Right Arm)   Pulse 90   Temp 98.3 F (36.8 C) (Oral)   Resp 18   Ht 5' 3.25" (1.607 m)   Wt 69.1 kg   SpO2 95%   BMI 26.77 kg/m  Constitutional: No distress . Vital signs reviewed. HEENT: EOMI, oral membranes moist Neck: supple Cardiovascular: RRR without murmur. No JVD    Respiratory: CTA Bilaterally without wheezes or rales. Normal effort    GI: BS +, non-tender, non-distended  Musculoskeletal: No edema or tenderness in extremities Neurological: She is alert and oriented Motor: LUE 3/5 prox to distal (stable) LLE 4/5 prox to distal (stable) RUE/RLE 5/5 proximal to distal.  Skin: Skin is warm. Intact. Psychiatric: Pleasant and cooperative.   Assessment/Plan: 1. 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 Physiatrist is providing close team supervision and 24 hour management of active medical problems listed below. Physiatrist and rehab team continue to assess barriers to discharge/monitor  patient progress toward functional and medical goals.  Function:  Bathing Bathing position   Position: Shower  Bathing parts Body parts bathed by patient: Chest, Abdomen, Front perineal area, Right upper leg, Left upper leg, Left arm, Right lower leg, Left lower leg, Right arm, Buttocks Body parts bathed by helper: Back  Bathing assist Assist Level: Touching or steadying assistance(Pt > 75%)      Upper Body Dressing/Undressing Upper body dressing   What is the patient wearing?: Pull over shirt/dress     Pull over shirt/dress - Perfomed by patient: Thread/unthread right sleeve, Thread/unthread left sleeve, Put head through opening, Pull shirt over trunk Pull over shirt/dress - Perfomed by helper: Thread/unthread right sleeve        Upper body assist Assist Level: Supervision or verbal cues      Lower Body Dressing/Undressing Lower body dressing   What is the patient wearing?: Pants, Non-skid slipper socks, Shoes Underwear - Performed by patient: Thread/unthread left underwear leg, Pull underwear up/down Underwear - Performed by helper: Thread/unthread right underwear leg Pants- Performed by patient: Thread/unthread right pants leg, Thread/unthread left pants leg, Pull pants up/down Pants- Performed by helper: Thread/unthread right pants leg Non-skid slipper socks- Performed by patient: Don/doff right sock, Don/doff left sock Non-skid slipper socks- Performed by helper: Don/doff right sock, Don/doff left sock     Shoes - Performed by patient: Don/doff right shoe, Don/doff left shoe Shoes - Performed by helper: Don/doff left shoe  Lower body assist Assist for lower body dressing: Touching or steadying assistance (Pt > 75%)      Toileting Toileting   Toileting steps completed by patient: Adjust clothing prior to toileting, Performs perineal hygiene, Adjust clothing after toileting Toileting steps completed by helper: Adjust clothing after toileting    Toileting  assist Assist level: Touching or steadying assistance (Pt.75%)   Transfers Chair/bed transfer   Chair/bed transfer method: Stand pivot Chair/bed transfer assist level: Touching or steadying assistance (Pt > 75%) Chair/bed transfer assistive device: Armrests, Cane     Locomotion Ambulation     Max distance: 100 ft  Assist level: Touching or steadying assistance (Pt > 75%)   Wheelchair   Type: Manual Max wheelchair distance: 125 ft  Assist Level: Supervision or verbal cues  Cognition Comprehension Comprehension assist level: Follows basic conversation/direction with no assist  Expression Expression assist level: Expresses basic needs/ideas: With no assist  Social Interaction Social Interaction assist level: Interacts appropriately with others - No medications needed.  Problem Solving Problem solving assist level: Solves basic 90% of the time/requires cueing < 10% of the time  Memory Memory assist level: Complete Independence: No helper     Medical Problem List and Plan:  1. 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 DVT Prophylaxis/Anticoagulation: SCDs  3. Pain Management: N/A  4. Mood: LCSW to follow for evaluation and support.  5. Neuropsych: This patient is capable of making decisions on his own behalf.  6. Skin/Wound Care: routine pressure relief measures.  7. Fluids/Electrolytes/Nutrition: Patient is pushing p.o. fluids   -BUN and creatinine with improvement to 36/1.6 , looks to be near baseline  -off IV fluids and observe, recheck BMET on Wednesday 8. HTN: Difficult to control PTA.     Hydralazine 10 mg every 8 when necessary   Vitals:   03/11/18 0349 03/11/18 0833  BP: (!) 143/56 (!) 165/82  Pulse: 79 90  Resp: 18   Temp: 98.3 F (36.8 C)   SpO2: 95%    Will discontinue lisinopril and increase amlodipine to 10 mg 9. T2DM: Hgb A1C- 7.3.  Check blood sugars before meals and at bedtime.   Patient had been on Glucophage 500 mg twice daily prior to admission   Monitor with increased mobility   CBG (last 3)  Recent Labs    03/10/18 2105 03/11/18 0634 03/11/18 1209  GLUCAP 139* 120* 111*  controlled 9/4         10. CKD: Baseline SCr 1.6. Encourage fluid intake. Monitor renal status with serial checks for stability.   -IV fluids stopped,at baseline now  11.  Hgb 10.9gm- baseline ~12 ,  stool OB-, no hematochezia     LOS (Days) 12 A FACE TO FACE EVALUATION WAS PERFORMED   Charlett Blake 03/11/2018 12:51 PM

## 2018-03-12 ENCOUNTER — Inpatient Hospital Stay (HOSPITAL_COMMUNITY): Payer: Medicare Other | Admitting: Physical Therapy

## 2018-03-12 ENCOUNTER — Inpatient Hospital Stay (HOSPITAL_COMMUNITY): Payer: Medicare Other | Admitting: Occupational Therapy

## 2018-03-12 ENCOUNTER — Inpatient Hospital Stay (HOSPITAL_COMMUNITY): Payer: Medicare Other

## 2018-03-12 LAB — GLUCOSE, CAPILLARY
Glucose-Capillary: 127 mg/dL — ABNORMAL HIGH (ref 70–99)
Glucose-Capillary: 90 mg/dL (ref 70–99)
Glucose-Capillary: 136 mg/dL — ABNORMAL HIGH (ref 70–99)
Glucose-Capillary: 133 mg/dL — ABNORMAL HIGH (ref 70–99)

## 2018-03-12 NOTE — Progress Notes (Signed)
Occupational Therapy Session Note  Patient Details  Name: [name] MRN: [id number] Date of Birth: 1946/01/30  Today's Date: 03/12/2018 OT Individual Time: 5027-7412 OT Individual Time Calculation (min): 60 min    Short Term Goals: Week 2:  OT Short Term Goal 1 (Week 2): Pt will don shoes with mod assist utilizing AE as needed OT Short Term Goal 2 (Week 2): Pt will complete UB dressing with min assist OT Short Term Goal 3 (Week 2): Pt will complete toilet transfer with min assist with RW OT Short Term Goal 4 (Week 2): Pt will maintain standing with min assist to complete 2 grooming tasks  Skilled Therapeutic Interventions/Progress Updates:    Treatment session with focus on ADL retraining with functional transfers and LB dressing.  Pt ambulated to room shower with min assist to min guard with SPC. Competed walk-in shower transfer with min assist. Engaged in bathing with increased time and cues for thoroughness and to utilize LUE during bathing.  Completed dressing seated on EOB with pt requiring increased time to don shirt due to sleeve falling off LUE due to Lt inattention.  Contact guard and tactile cues for standing balance while pulling pants over hips.  Pt requires increased time during bathing and dressing due to Lt inattention and decreased awareness and problem solving.  Therapy Documentation Precautions:  Precautions Precautions: Fall Precaution Comments: Lt hemi Restrictions Weight Bearing Restrictions: No Pain:  Pt with no c/o pain  See Function Navigator for Current Functional Status.   Therapy/Group: Individual Therapy  [name] 03/12/2018, 9:11 AM

## 2018-03-12 NOTE — Progress Notes (Signed)
Physical Therapy Session Note  Patient Details  Name: [name] MRN: [id number] Date of Birth: 06-18-1946  Today's Date: 03/12/2018 PT Individual Time: 1000-1100 PT Individual Time Calculation (min): 60 min   Short Term Goals: Week 2:  PT Short Term Goal 1 (Week 2): Pt will ambulate 75 ft with LRAD & supervision. PT Short Term Goal 2 (Week 2): Pt will complete car transfer with min assist.  PT Short Term Goal 3 (Week 2): Pt will negotiate 12 steps with rails and min assist.  Skilled Therapeutic Interventions/Progress Updates:    Pt received seated in w/c in room, agreeable to PT. No complaints of pain. Manual w/c propulsion 2 x 150 ft with BLE and Supervision. Sit to stand with min A from w/c height, v/c for forward weight shift with transfer. Ambulation 4 x 50 ft with SPC and CGA to min A, focus on heel strike with LLE during gait as well as maintaining balance (decreased lean to L). Side-steps L/R 2 x 30 ft each with rail in hallway and CGA, focus on hip abductor engagement and upright posture. Lateral step-taps 4" and 6" with one UE support and CGA, focus on increased control of LLE. Pt left seated in w/c in room at end of session, needs in reach and chair alarm in place.  Therapy Documentation Precautions:  Precautions Precautions: Fall Precaution Comments: Lt hemi Restrictions Weight Bearing Restrictions: No  See Function Navigator for Current Functional Status.   Therapy/Group: Individual Therapy  Excell Seltzer, PT, DPT  03/12/2018, 12:20 PM

## 2018-03-12 NOTE — Progress Notes (Addendum)
Physical Therapy Session Note  Patient Details  Name: [name] MRN: [id number] Date of Birth: [birth year]  Today's Date: 03/12/2018 tx 2: PT Individual Time: 1115-1200 PT Individual Time Calculation (min): 45 min   Short Term Goals:  Week 2:  PT Short Term Goal 1 (Week 2): Pt will ambulate 75 ft with LRAD & supervision. PT Short Term Goal 2 (Week 2): Pt will complete car transfer with min assist.  PT Short Term Goal 3 (Week 2): Pt will negotiate 12 steps with rails and min assist.     Skilled Therapeutic Interventions/Progress Updates:   tx 1: Neuro re-ed via forced use, VCS for w/c propulsion using bil LEs , supervision.  Stand pivot w/c> mat to R with min guard assist.  Seated activities to promote trunk shortening/lengthening/rotating, in sitting- reaching out of BOS with L and R hands; reciprocal scooting forward/backward.  Sitting astride backless bench to prevent posterior pelvic tilt- reaching out of BOS in all directions with R hand to promote symmetrical trunk flexion with straight back.  Gait with SPC on level tile with min guard assist, x 10' to w/c.    Pt left resting in w/c with LeeAnn, NT in room.  tx 2:  1435-1505, 30 min individual tx  bed mobility training in flat bed, no rails, sitting up to R, as per home set-up.  Pt required max cues and mod assist 1st trial, mod cues min assisit 2nd trial  Gait training in room with [facility] to/from toilet for continent voiding.  Pt managed clothes with min cues and min assist for balance in standing.  Gait training forward/backwards with SPC, min guard assist, with clues to increase L knee and hip flexion, especially dring backwards gait.  L/R lateral ambulation with cane with min guard assist.  Cues for cane placement during sit>< stand.  Pt seemed to prefer standing up, then reaching back for cane on surface; pt was safe x 5 throughout session..  Pt left resting in w/c with needs at hand and seat alarm set.     Therapy  Documentation Precautions:  Precautions Precautions: Fall Precaution Comments: Lt hemi Restrictions Weight Bearing Restrictions: No  Pain: Pain Assessment Pain Score: 0-No pain AM and PM    See Function Navigator for Current Functional Status.   Therapy/Group: Individual Therapy  Masayoshi Couzens 03/12/2018, 12:14 PM

## 2018-03-12 NOTE — Progress Notes (Signed)
[place] PHYSICAL MEDICINE & REHABILITATION     PROGRESS NOTE  Subjective/Complaints:   Discussed BP meds with daughter (caregiver)  ROS: Patient deniesCP, SOB, N/V/D Objective: Vital Signs: Blood pressure (!) 137/52, pulse 74, temperature 98.3 F (36.8 C), temperature source Oral, resp. rate 18, height 5' 3.25" (1.607 m), weight 69.1 kg, SpO2 97 %. No results found. Recent Labs    03/11/18 0520  WBC 8.3  HGB 10.7*  HCT 32.6*  PLT 264   Recent Labs    03/11/18 0520  NA 141  K 4.1  CL 115*  GLUCOSE 138*  BUN 37*  CREATININE 1.62*  CALCIUM 8.9   CBG (last 3)  Recent Labs    03/11/18 1725 03/11/18 2113 03/12/18 0620  GLUCAP 115* 218* 127*    Wt Readings from Last 3 Encounters:  03/11/18 69.1 kg  02/24/18 71.3 kg  02/24/18 71.4 kg    Physical Exam:  BP (!) 137/52 (BP Location: Left Arm)   Pulse 74   Temp 98.3 F (36.8 C) (Oral)   Resp 18   Ht 5' 3.25" (1.607 m)   Wt 69.1 kg   SpO2 97%   BMI 26.77 kg/m  Constitutional: No distress . Vital signs reviewed. HEENT: EOMI, oral membranes moist Neck: supple Cardiovascular: RRR without murmur. No JVD    Respiratory: CTA Bilaterally without wheezes or rales. Normal effort    GI: BS +, non-tender, non-distended  Musculoskeletal: No edema or tenderness in extremities Neurological: She is alert and oriented Motor: LUE 3/5 prox to distal (stable) LLE 4/5 prox to distal (stable) RUE/RLE 5/5 proximal to distal.  Skin: Skin is warm. Intact. Psychiatric: Pleasant and cooperative.   Assessment/Plan: 1. 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 Physiatrist is providing close team supervision and 24 hour management of active medical problems listed below. Physiatrist and rehab team continue to assess barriers to discharge/monitor patient progress toward  functional and medical goals.  Function:  Bathing Bathing position   Position: Shower  Bathing parts Body parts bathed by patient: Chest, Abdomen, Front perineal area, Right upper leg, Left upper leg, Left arm, Right lower leg, Left lower leg, Right arm, Buttocks Body parts bathed by helper: Back  Bathing assist Assist Level: Supervision or verbal cues      Upper Body Dressing/Undressing Upper body dressing   What is the patient wearing?: Pull over shirt/dress     Pull over shirt/dress - Perfomed by patient: Thread/unthread right sleeve, Thread/unthread left sleeve, Put head through opening, Pull shirt over trunk Pull over shirt/dress - Perfomed by helper: Thread/unthread right sleeve        Upper body assist Assist Level: Supervision or verbal cues      Lower Body Dressing/Undressing Lower body dressing   What is the patient wearing?: Pants, Non-skid slipper socks, Shoes, Underwear Underwear - Performed by patient: Thread/unthread left underwear leg, Pull underwear up/down, Thread/unthread right underwear leg Underwear - Performed by helper: Thread/unthread right underwear leg Pants- Performed by patient: Thread/unthread right pants leg, Thread/unthread left pants leg, Pull pants up/down Pants- Performed by helper: Thread/unthread right pants leg Non-skid slipper socks- Performed by patient: Don/doff right sock, Don/doff left sock Non-skid slipper socks- Performed by helper: Don/doff right sock, Don/doff left sock     Shoes - Performed by patient: Don/doff right shoe, Don/doff left shoe Shoes - Performed by helper: Don/doff left shoe  Lower body assist Assist for lower body dressing: Touching or steadying assistance (Pt > 75%)      Toileting Toileting   Toileting steps completed by patient: Adjust clothing prior to toileting, Performs perineal hygiene, Adjust clothing after toileting Toileting steps completed by helper: Adjust clothing after toileting     Toileting assist Assist level: Touching or steadying assistance (Pt.75%)   Transfers Chair/bed transfer   Chair/bed transfer method: Stand pivot Chair/bed transfer assist level: Touching or steadying assistance (Pt > 75%) Chair/bed transfer assistive device: Armrests, Cane     Locomotion Ambulation     Max distance: 100 ft  Assist level: Touching or steadying assistance (Pt > 75%)   Wheelchair   Type: Manual Max wheelchair distance: 100 Assist Level: Supervision or verbal cues  Cognition Comprehension Comprehension assist level: Follows basic conversation/direction with no assist  Expression Expression assist level: Expresses basic needs/ideas: With no assist  Social Interaction Social Interaction assist level: Interacts appropriately 90% of the time - Needs monitoring or encouragement for participation or interaction.  Problem Solving Problem solving assist level: Solves basic 90% of the time/requires cueing < 10% of the time  Memory Memory assist level: Recognizes or recalls 75 - 89% of the time/requires cueing 10 - 24% of the time     Medical Problem List and Plan:  1. 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 DVT Prophylaxis/Anticoagulation: SCDs  3. Pain Management: N/A  4. Mood: LCSW to follow for evaluation and support.  5. Neuropsych: This patient is capable of making decisions on his own behalf.  6. Skin/Wound Care: routine pressure relief measures.  7. Fluids/Electrolytes/Nutrition: Patient is pushing p.o. fluids   -BUN and creatinine with improvement to 36/1.6 , looks to be near baseline  -recheck BMET 1-2 wk in MD office 8. HTN: Difficult to control PTA.     Hydralazine 10 mg every 8 when necessary   Vitals:   03/11/18 1918 03/12/18 0410  BP: (!) 153/65 (!) 137/52  Pulse: 82 74  Resp: 18 18  Temp: 98.2 F  (36.8 C) 98.3 F (36.8 C)  SpO2: 96% 97%   Off lisinopril and increase amlodipine to 10 mg 9/5 9. T2DM: Hgb A1C- 7.3. Check blood sugars before meals and at bedtime.   Patient had been on Glucophage 500 mg twice daily prior to admission   Monitor with increased mobility   CBG (last 3)  Recent Labs    03/11/18 1725 03/11/18 2113 03/12/18 0620  GLUCAP 115* 218* 127*  controlled 9/5- not on metformin         10. CKD: Baseline SCr 1.6. Encourage fluid intake. Monitor renal status with serial checks for stability.   -IV fluids stopped,at baseline now  11.  Hgb 10.9gm- baseline ~12 ,  stool OB-, no hematochezia     LOS (Days) 13 A FACE TO FACE EVALUATION WAS PERFORMED   Charlett Blake 03/12/2018 9:20 AM

## 2018-03-12 NOTE — Progress Notes (Signed)
Social Work Patient ID: [name], female   DOB: [birth year], 72 y.o.   MRN: [id number]  Family education set up tomorrow at 9:00 if goes well pt would like to discharge Sat instead of next Wed. Will check with MD regarding medically readiness and see tomorrow.

## 2018-03-13 ENCOUNTER — Inpatient Hospital Stay (HOSPITAL_COMMUNITY): Payer: Medicare Other | Admitting: Physical Therapy

## 2018-03-13 ENCOUNTER — Encounter: Payer: Self-pay | Admitting: Family Medicine

## 2018-03-13 ENCOUNTER — Encounter (HOSPITAL_COMMUNITY): Payer: Medicare Other | Admitting: Occupational Therapy

## 2018-03-13 ENCOUNTER — Inpatient Hospital Stay (HOSPITAL_COMMUNITY): Payer: Medicare Other | Admitting: Occupational Therapy

## 2018-03-13 LAB — GLUCOSE, CAPILLARY
GLUCOSE-CAPILLARY: 122 mg/dL — AB (ref 70–99)
GLUCOSE-CAPILLARY: 157 mg/dL — AB (ref 70–99)
Glucose-Capillary: 105 mg/dL — ABNORMAL HIGH (ref 70–99)
Glucose-Capillary: 117 mg/dL — ABNORMAL HIGH (ref 70–99)

## 2018-03-13 MED ORDER — AMLODIPINE BESYLATE 10 MG PO TABS: 10.0000 mg | ORAL_TABLET | Freq: Every day | ORAL | 1 refills | Status: DC

## 2018-03-13 MED ORDER — ATORVASTATIN CALCIUM 40 MG PO TABS: 40.0000 mg | ORAL_TABLET | Freq: Every day | ORAL | 0 refills | Status: DC

## 2018-03-13 MED ORDER — CLOPIDOGREL BISULFATE 75 MG PO TABS: 75.0000 mg | ORAL_TABLET | Freq: Every day | ORAL | 0 refills | Status: DC

## 2018-03-13 MED ORDER — ACETAMINOPHEN 325 MG PO TABS: 325.0000 mg | ORAL_TABLET | ORAL | Status: DC | PRN

## 2018-03-13 NOTE — Progress Notes (Addendum)
Physical Therapy Session Note  Patient Details  Name: [name] MRN: [id number] Date of Birth: 1945-12-20  Today's Date: 03/13/2018 PT Individual Time: 1010-1103 and 8250-5397 PT Individual Time Calculation (min): 53 min and 45 min   Short Term Goals: Week 2:  PT Short Term Goal 1 (Week 2): Pt will ambulate 75 ft with LRAD & supervision. PT Short Term Goal 2 (Week 2): Pt will complete car transfer with min assist.  PT Short Term Goal 3 (Week 2): Pt will negotiate 12 steps with rails and min assist.  Skilled Therapeutic Interventions/Progress Updates:  Treatment 1: Pt received in room with daughters ([name] & [name]) present for caregiver training. Daughters report pt's husband isn't feeling well so he is not here for caregiver training. Therapist educated on need to remove throw rugs and ensure clear pathways to reduce tripping hazards in the home, discussed [employer] sling purpose & donning/doffing, and pt safety with functional mobility. Also discussed how pt may need more assistance when fatigued and energy conservation methods. Pt completed car transfer at sedan simulated height with [facility] & assistance from daughters with instructional cuing to sit on seat then transfer LE in/out of car. Pt negotiated 4 steps + 2 steps + 2 steps with L rail, laterally with min assist with therapist then daughters & pt return demonstrating. Pt completed transfer from low compliant couch with min assist and bed mobility with min assist 2/2 significant fatigue. Pt's daughters demonstrate proper hand placement, body positioning, and safety awareness when assisting pt. Reinforced need for caregivers to have hands on pt at all times. Daughter assisted pt with ambulation w/c>bed x 15 ft without assistance/cuing from therapist. Pt & family report comfort with assisting pt. At end of session pt left in bed with daughters present to supervise. Also educated pt & family on risks of another stroke and signs/symptoms to look  out for.   Treatment 2: Pt received in w/c reporting significant fatigue but agreeable to tx, no c/o pain reported. Transported pt to gym via w/c total assist for time management. Pt negotiated ramp & uneven surface with SPC & min assist with cuing for safety and to decrease step length for increased balance. Pt completes sit>stand with RUE by pushing up on armrest, extra time and supervision<>CGA. Pt completed w/c<>bed via stand pivot with steady assist. Pt completed bed mobility with supervision with significantly extra time for sit>supine as pt with difficulty transferring LLE onto elevated bed (pt & family report her bed is lower at home). Pt performs rolling L<>R and supine>sit with supervision and extra time. Pt ambulates 125 ft with SPC and steady assist except min assist to correct 1 LOB. Pt performs chair>w/c with SPC and close supervision. Pt propels w/c back towards room with BLE and supervision with extra time. Pt returns to bed via stand pivot with close supervision and extra time for sit>supine. Pt scooted to [facility] with bed in trendelenburg and extra time. Pt left in bed with alarm set & all needs in reach.   Therapy Documentation Precautions:  Precautions Precautions: Fall Precaution Comments: Lt hemi Restrictions Weight Bearing Restrictions: (P) No   See Function Navigator for Current Functional Status.   Therapy/Group: Individual Therapy  [name] 03/13/2018, 3:41 PM

## 2018-03-13 NOTE — Discharge Summary (Signed)
Discharge summary job # [id number]

## 2018-03-13 NOTE — Discharge Summary (Signed)
NAME: [name], [name] MEDICAL RECORD LE:[id number] ACCOUNT [id number] DATE OF BIRTH:1945/11/20 FACILITY: [facility] LOCATION: [facility] PHYSICIAN:ANDREW Letta Pate, MD  DISCHARGE SUMMARY  DATE OF DISCHARGE:  02/27/2018  DISCHARGE DIAGNOSES: 1.  Right superior frontal scattered foci, right insular foci infarcts, felt to be embolic. 2.  Sequential compression devices for deep venous thrombosis prophylaxis. 3.  Hypertension. 4.  Diabetes mellitus.   5.  Chronic kidney disease.  HISTORY OF PRESENT ILLNESS:  This is a 72 year old right-handed female with history of hypertension, CKD with baseline creatinine 1.6, diabetes mellitus.  Lives with spouse and daughter.  Reported to be independent prior to admission.  Son is a  cardiologist in [place].  Admitted 02/24/2018 with weakness, left upper extremity heaviness, elevated blood pressure.  MRI/MRA revealed clustered foci of acute early subacute infarctions within the left frontoparietal junction with additional  punctate infarction, right insula and short segment right M2 division, severe stenosis near occlusion and tandem stenosis right A3.  Carotid Dopplers without evidence of significant ICA stenosis.  Echocardiogram with ejection fraction of 65%, no wall  motion abnormalities, severely calcified MV and grade II diastolic dysfunction.  Dr. Leonie Man, neurology services, felt stroke was embolic due to unknown source.  AFib suspected. The patient declined TEE and loop recorder.  Maintained on aspirin and Plavix  for 3 weeks, then aspirin alone.  The patient was admitted for a comprehensive rehabilitation program.  PAST MEDICAL HISTORY:  See discharge diagnoses.  SOCIAL HISTORY:  Lives with spouse and daughter.  FUNCTIONAL STATUS:  Upon admission to rehab services, minimal assist 60 feet using a handrail in the hallways, minimal assist sit to stand, minimal assist stand, pivot transfers, min mod assist activities of daily living.  PHYSICAL  EXAMINATION: VITAL SIGNS:  Blood pressure 165/60, pulse 91, temperature 98, respirations 18. GENERAL:  Alert female, oriented to person, place and time. HEENT:  EOMs intact. NECK:  Supple, nontender, no JVD. CARDIOVASCULAR:  Rate controlled. ABDOMEN:  Soft, nontender, good bowel sounds. LUNGS:  Clear to auscultation without wheeze, mildly dysarthric, but fully intelligible.  REHABILITATION HOSPITAL COURSE:  The patient was admitted to inpatient rehab services.  Therapies initiated on a 3-hour daily basis, consisting of physical therapy, occupational therapy and rehabilitation nursing.  The following issues were addressed  during patient's rehabilitation stay.  Pertaining to the patient's right superior frontal scattered foci and right insular foci infarcts felt to be embolic, she would follow neurology services.  She remained on aspirin and Plavix through 03/21/2018, then  aspirin alone.  Blood pressure controlled, Norvasc titrated.  She had been on lisinopril.  Close monitoring of chemistries.  Creatinine baseline 1.6.  Her metformin had been held due to mild renal insufficiency.  Blood sugars overall controlled.  She  had received IV fluids initially with monitoring of kidney function.  The patient received weekly collaborative interdisciplinary team conferences to discuss estimated length of stay, family teaching, any barriers to discharge.  Ambulates 50 feet x4,  straight point cane, contact guard assist, side steps with contact guard, lateral step taps 4 inches and 6 inches with contact guard assist.  Propels her wheelchair modified independence.  Gathers belongings for activities of daily living and homemaking,  working with functional transfers, lower body dressing.  The patient ambulates to the room to shower with minimal assistance, minimal guard.  Straight point cane.  Full family teaching completed and plan discharge to home.  DISCHARGE MEDICATIONS:  Included Norvasc 10 mg p.o. daily,  aspirin 81 mg p.o. daily, Plavix 75  mg p.o. daily, Allegra 60 mg daily as needed, Tylenol as needed.  Her diet was a diabetic diet.  She would follow up with Dr. Alysia Penna at the outpatient rehab center as directed; Dr. Antony Contras, call for appointment; Dr. Deborra Medina, medical management 03/23/2018.  SPECIAL INSTRUCTIONS:  No driving.  Continue aspirin and Plavix through 03/21/2018, then aspirin alone.  Follow up chemistry with BMET 1 week with primary MD with Glucophage currently on hold with monitoring of renal function.  LN/NUANCE D:03/13/2018 T:03/13/2018 JOB:[id number]

## 2018-03-13 NOTE — Progress Notes (Signed)
Occupational Therapy Session Note  Patient Details  Name: [name] MRN: [id number] Date of Birth: 13-Sep-1945  Today's Date: 03/13/2018 OT Individual Time: 0900-1000 and 1303-1330 OT Individual Time Calculation (min): 60 min and 27 min   Short Term Goals: Week 2:  OT Short Term Goal 1 (Week 2): Pt will don shoes with mod assist utilizing AE as needed OT Short Term Goal 2 (Week 2): Pt will complete UB dressing with min assist OT Short Term Goal 3 (Week 2): Pt will complete toilet transfer with min assist with RW OT Short Term Goal 4 (Week 2): Pt will maintain standing with min assist to complete 2 grooming tasks  Skilled Therapeutic Interventions/Progress Updates:    1) Treatment session with focus on hands on family education with pt and her 2 daughters.  Pt's husband was not present as he reportedly was not feeling well.  Engaged in hands on education with daughters returning demonstration of proper cueing and physical assistance with walk-in shower transfers and toilet transfers.  Discussed setup of home shower with recommendation to add grab bar in shower as well as have hand held shower head for increased safety and independence.  Discussed recommendation for min assist with shower transfers and close supervision to occasional min assist with transfers with [facility] due to pt impulsivity.  Pt's daughters verbalized and demonstrated independence with providing physical and verbal cues for pt during mobility, bathroom transfers, and self-care tasks.  Provided pt with self-ROM and stretching handout with pt able to demonstrate each exercise and daughters verbalizing techniques and ability to cue pt as needed.  2) Treatment session with focus on d/c planning.  Pt received upright in w/c reporting fatigue and requesting to complete therapy in room at seated level.  Therapist reiterated self-ROM and stretching handouts provided during AM sessions with pt able to demonstrate proper stretching  techniques.  Pt demonstrating improved elbow extension and even wrist extension to neutral.  Educated on diet modifications and reiterated handouts provided by PA when he went through d/c instructions.  Discussed energy conservation strategies with self-care tasks with pt reporting understanding.  Pt left upright in w/c with all needs in reach.  Therapy Documentation Precautions:  Precautions Precautions: Fall Precaution Comments: Lt hemi Restrictions Weight Bearing Restrictions: (P) No Pain: Pain Assessment Pain Scale: 0-10 Pain Score: 0-No pain ADL: ADL ADL Comments: Please see functional navigator for ADL status  See Function Navigator for Current Functional Status.   Therapy/Group: Individual Therapy  [name] 03/13/2018, 12:17 PM

## 2018-03-13 NOTE — Progress Notes (Signed)
Social Work  Discharge Note  The overall goal for the admission was met for: DC-SAT 9/7  Discharge location: [place].  Length of Stay: Yes-15 DAYS  Discharge activity level: Yes-MIN ASSIST LEVEL  Home/community participation: Yes  Services provided included: MD, RD, PT, OT, RN, CM, TR, Pharmacy, Neuropsych and SW  Financial Services: Private Insurance: [facility]  Follow-up services arranged: Home Health: [place], DME: [place] and Patient/Family has no preference for HH/DME agencies  Comments (or additional information):TWO DAUGHTER'S WERE HERE FOR EDUCATION ON Friday AND IT WENT WELL. HUSBAND COULD NOT COME DUE TO ILL. DAUGHTER'S WILL SHOW AND VIDEO TAPED PT THERAPY WHILE HERE. AWARE PT WILL REQUIRE 24 HR CARE AND PREPARED TO PROVIDE THIS. PT WANTED TO LEAVE SOONER AND THIS WAS ARRANGED. GOALS OF MIN ASSIST INSTEAD OF SUPERVISION, COULD HAVE REACHED SUPERVISION IF HAD STAYED UNTIL 9/11. ALL IN AGREEMENT WITH PLAN.  Patient/Family verbalized understanding of follow-up arrangements: Yes  Individual responsible for coordination of the follow-up plan: [name]-DAUGHTER  Confirmed correct DME delivered: [name] 03/13/2018    [name]

## 2018-03-13 NOTE — Progress Notes (Signed)
Social Work Patient ID: [name], female   DOB: 1945/11/22, 72 y.o.   MRN: [id number] Family education going well will plan on discharge tomorrow MD feels will be medically stable for discharge then. Team and PA aware of this plan.

## 2018-03-13 NOTE — Progress Notes (Addendum)
Physical Therapy Discharge Summary  Patient Details  Name: [name] MRN: [id number] Date of Birth: 06-11-1946  Today's Date: 03/13/2018   Patient has met 12 of 14 long term goals due to improved activity tolerance, improved balance, improved postural control, increased strength, ability to compensate for deficits and improved coordination.  Patient to discharge at an ambulatory level CGA with SPC.   Patient's care partner (daughters, [name] & [name]) is independent to provide the necessary physical assistance at discharge.  Reasons goals not met: impaired memory & weakness  Recommendation:  Patient will benefit from ongoing skilled PT services in home health setting to continue to advance safe functional mobility, address ongoing impairments in dynamic balance, gait, stair negotiation, L NMR, strengthening, endurance, safety awareness, transfers, and minimize fall risk.  Equipment: 16x16 hemi height w/c and SPC  Reasons for discharge: discharge from hospital  Patient/family agrees with progress made and goals achieved: Yes  PT Discharge Precautions/Restrictions Precautions Precautions: Fall Precaution Comments: Lt hemi Restrictions Weight Bearing Restrictions: No  Vision/Perception  Pt wears glasses at all times at baseline for reading only. Pt denies changes in baseline vision. No apparent visual deficits noted.   Cognition Overall Cognitive Status: Within Functional Limits for tasks assessed Arousal/Alertness: Awake/alert Orientation Level: Oriented X4 Memory: Impaired Memory Impairment: Decreased recall of new information(requires cuing for hand placement & sequencing of transfers)  Sensation Sensation Light Touch: Not tested Proprioception: Not tested Coordination Gross Motor Movements are Fluid and Coordinated: No Fine Motor Movements are Fluid and Coordinated: No Coordination and Movement Description: Lt hemiparesis  Motor  Motor Motor: Abnormal postural  alignment and control;Hemiplegia Motor - Discharge Observations: generalized weakness, decreased cardiopulmonary endurance training   Mobility Bed Mobility Bed Mobility: Rolling Right;Rolling Left;Sit to Supine;Left Sidelying to Sit Rolling Right: Supervision/verbal cueing Rolling Left: Supervision/Verbal cueing Left Sidelying to Sit: Supervision/Verbal cueing Sit to Supine: Supervision/Verbal cueing Transfers Transfers: Sit to Stand;Stand Pivot Transfers Sit to Stand: Supervision/Verbal cueing (with extra time);Contact Guard/Touching assist Stand Pivot Transfers: Supervision/Verbal cueing Stand Pivot Transfer Details: Verbal cues for sequencing;Verbal cues for technique;Verbal cues for precautions/safety  Locomotion  Gait Ambulation: Yes Gait Assistance: Contact Guard/Touching assist Gait Distance (Feet): 125 Feet Assistive device: Straight cane Gait Assistance Details: Verbal cues for sequencing;Verbal cues for technique;Verbal cues for precautions/safety Gait Gait: Yes Gait Pattern: (decreased weight shifting L<>R, decreased heel strike LLE, decreased stride length, absent LUE reciprocal arm swing) Gait velocity: decreased Stairs / Additional Locomotion Stairs: Yes Stairs Assistance: Minimal Assistance - Patient > 75% Stair Management Technique: One rail Left (laterally) Number of Stairs: 4 Height of Stairs: 6(inches) Ramp: Minimal Assistance - Patient >75%(ambulatory with SPC) Wheelchair Mobility Wheelchair Mobility: Yes Wheelchair Assistance: Chartered loss adjuster: Both lower extermities Wheelchair Parts Management: Needs assistance Distance: 150 ft   Trunk/Postural Assessment  Cervical Assessment Cervical Assessment: Within Functional Limits Thoracic Assessment Thoracic Assessment: Within Functional Limits Lumbar Assessment Lumbar Assessment: (posterior pelvic tilt) Postural Control Postural Control: Deficits on evaluation(L lateral  bias/weight bearing in standing) Righting Reactions: delayed Protective Responses: delayed   Balance Balance Balance Assessed: Yes Dynamic Standing Balance Dynamic Standing - Balance Support: Right upper extremity supported Dynamic Standing - Level of Assistance: 4: CGA/Min assist Dynamic Standing - Balance Activities: (during gait with SPC)  Extremity Assessment  RUE Assessment RUE Assessment: Within Functional Limits LUE Assessment LUE Assessment: (no active movement noted, PROM WFL) RLE Assessment RLE Assessment: Within Functional Limits LLE Assessment LLE Assessment: Exceptions to [facility] Passive Range of Motion (PROM) Comments: tight heel  cords General Strength Comments: grossly 4/5 hip and ankle; 4+/5 knee; all in sitting (per eval, not able to re test on d/c day)   See Function Navigator for Current Functional Status.  [name] 03/13/2018, 3:55 PM

## 2018-03-13 NOTE — Progress Notes (Signed)
Occupational Therapy Discharge Summary  Patient Details  Name: [name] MRN: [id number] Date of Birth: 1945/08/30  Patient has met 76 of 10 long term goals due to improved activity tolerance, improved balance, postural control, ability to compensate for deficits, functional use of  LEFT upper and LEFT lower extremity, improved awareness and improved coordination.  Patient to discharge at overall Supervision, Min assist for shower transfers level.  Patient's care partner is independent to provide the necessary physical and cognitive assistance at discharge.  Patient's daughters plan to provide majority of care for pt, husband can provide supervision when they are not present.    Reasons goals not met: N/A  Recommendation:  Patient will benefit from ongoing skilled OT services in home health setting to continue to advance functional skills in the area of BADL and Reduce care partner burden.  Equipment: 3 in 1  Reasons for discharge: treatment goals met and discharge from hospital  Patient/family agrees with progress made and goals achieved: Yes  OT Discharge Precautions/Restrictions  Precautions Precautions: Fall Precaution Comments: Lt hemi Restrictions Weight Bearing Restrictions: No General   Vital Signs Therapy Vitals Temp: 97.6 F (36.4 C) Pulse Rate: 75 BP: (!) 160/61 Patient Position (if appropriate): Lying Oxygen Therapy SpO2: 97 % O2 Device: Room Air Pain Pain Assessment Pain Scale: 0-10 Pain Score: 0-No pain ADL ADL ADL Comments: Please see functional navigator for ADL status Vision Baseline Vision/History: Wears glasses Wears Glasses: Reading only Patient Visual Report: No change from baseline Vision Assessment?: No apparent visual deficits Perception  Perception: Impaired Inattention/Neglect: Does not attend to left visual field;Does not attend to left side of body Praxis Praxis: Impaired Praxis Impairment Details: Motor  planning Cognition Overall Cognitive Status: Within Functional Limits for tasks assessed Arousal/Alertness: Awake/alert Orientation Level: Oriented X4 Sustained Attention: Appears intact Memory: Impaired Memory Impairment: Decreased recall of new information(requires cuing for hand placement & sequencing of transfers) Awareness: Impaired Problem Solving: Impaired Safety/Judgment: Impaired Sensation Sensation Light Touch: Appears Intact Proprioception: Impaired by gross assessment Proprioception Impaired Details: Impaired LUE Coordination Gross Motor Movements are Fluid and Coordinated: No Fine Motor Movements are Fluid and Coordinated: No Coordination and Movement Description: Lt hemiparesis Finger Nose Finger Test: decreased coordination with LUE Motor  Motor Motor: Abnormal postural alignment and control;Hemiplegia Motor - Discharge Observations: generalized weakness, decreased cardiopulmonary endurance training Mobility  Bed Mobility Bed Mobility: Rolling Right;Rolling Left;Sit to Supine;Left Sidelying to Sit Rolling Right: Supervision/verbal cueing Rolling Left: Supervision/Verbal cueing Left Sidelying to Sit: Supervision/Verbal cueing Sit to Supine: Supervision/Verbal cueing Transfers Sit to Stand: Supervision/Verbal cueing;Contact Guard/Touching assist  Trunk/Postural Assessment  Cervical Assessment Cervical Assessment: Within Functional Limits Thoracic Assessment Thoracic Assessment: Within Functional Limits Lumbar Assessment Lumbar Assessment: (posterior pelvic tilt) Postural Control Postural Control: Deficits on evaluation(L lateral bias/weight bearing in standing) Righting Reactions: delayed Protective Responses: delayed  Balance Balance Balance Assessed: Yes Dynamic Standing Balance Dynamic Standing - Balance Support: Right upper extremity supported Dynamic Standing - Level of Assistance: 4: Min assist Dynamic Standing - Balance Activities: (during gait  with SPC) Extremity/Trunk Assessment RUE Assessment RUE Assessment: Within Functional Limits LUE Assessment LUE Assessment: (no active movement noted, PROM WFL) Passive Range of Motion (PROM) Comments: WFL Active Range of Motion (AROM) Comments: shoulder flexion to 140*, elbow flexion/extension WNL, wrist flexion WNL and extension to neutral.  No active finger flexion/extension LUE Body System: Neuro Brunstrum levels for arm and hand: Hand;Arm Brunstrum level for arm: Stage IV Movement is deviating from synergy;Stage III Synergy is performed voluntarily  Brunstrum level for hand: Stage II Synergy is developing LUE Tone LUE Tone: Hypertonic   See Function Navigator for Current Functional Status.  [name] 03/13/2018, 3:55 PM

## 2018-03-13 NOTE — Discharge Instructions (Signed)
Inpatient Rehab Discharge Instructions  [name] Discharge date and time: No discharge date for patient encounter.   Activities/Precautions/ Functional Status: Activity: activity as tolerated Diet: Carb modified diet Wound Care: none needed Functional status:  ___ No restrictions     ___ Walk up steps independently ___ 24/7 supervision/assistance   ___ Walk up steps with assistance ___ Intermittent supervision/assistance  ___ Bathe/dress independently ___ Walk with walker     _x__ Bathe/dress with assistance ___ Walk Independently    ___ Shower independently ___ Walk with assistance    ___ Shower with assistance ___ No alcohol     ___ Return to work/school ________  Special Instructions: No driving  Continue aspirin and Plavix through 03/21/2018 then aspirin alone  Follow-up BMET 1 week with primary MD    [place]:    Home Health:   [place]   Date of last service:03/14/2018  Medical Equipment/Items Ordered:WHEELCHAIR, Casimiro Needle COMMODE  Agency/Supplier:[facility]   [phone]   GENERAL COMMUNITY RESOURCES FOR PATIENT/FAMILY: Support Groups:[facility] SUPPORT GROUP THE SECOND Thursday @ 6:00-7:00 PM ON THE REHAB UNIT QUESTIONS CALL [phone]  STROKE/TIA DISCHARGE INSTRUCTIONS SMOKING Cigarette smoking nearly doubles your risk of having a stroke & is the single most alterable risk factor  If you smoke or have smoked in the last 12 months, you are advised to quit smoking for your health.  Most of the excess cardiovascular risk related to smoking disappears within a year of stopping.  Ask you doctor about anti-smoking medications  [place] Quit Line: 1-800-QUIT NOW  Free Smoking Cessation Classes [phone]  CHOLESTEROL Know your levels; limit fat & cholesterol in your diet  Lipid Panel     Component Value Date/Time   CHOL 245 (H) 02/26/2018 1649   TRIG 308 (H) 02/26/2018 1649   HDL 42 02/26/2018 1649   CHOLHDL 5.8 02/26/2018 1649   VLDL 62 (H) 02/26/2018 1649   LDLCALC 141 (H) 02/26/2018 1649      Many patients benefit from treatment even if their cholesterol is at goal.  Goal: Total Cholesterol (CHOL) less than 160  Goal:  Triglycerides (TRIG) less than 150  Goal:  HDL greater than 40  Goal:  LDL (LDLCALC) less than 100   BLOOD PRESSURE American Stroke Association blood pressure target is less that 120/80 mm/Hg  Your discharge blood pressure is:  BP: (!) 165/65  Monitor your blood pressure  Limit your salt and alcohol intake  Many individuals will require more than one medication for high blood pressure  DIABETES (A1c is a blood sugar average for last 3 months) Goal HGBA1c is under 7% (HBGA1c is blood sugar average for last 3 months)  Diabetes:    Lab Results  Component Value Date   HGBA1C 7.4 (H) 02/26/2018     Your HGBA1c can be lowered with medications, healthy diet, and exercise.  Check your blood sugar as directed by your physician  Call your physician if you experience unexplained or low blood sugars.  PHYSICAL ACTIVITY/REHABILITATION Goal is 30 minutes at least 4 days per week  Activity: Increase activity slowly, Therapies: Physical Therapy: Home Health Return to work:   Activity decreases your risk of heart attack and stroke and makes your heart stronger.  It helps control your weight and blood pressure; helps you relax and can improve your mood.  Participate in a regular exercise program.  Talk with your doctor about the best form of  exercise for you (dancing, walking, swimming, cycling).  DIET/WEIGHT Goal is to maintain a healthy weight  Your discharge diet is:  Diet Order            Diet Carb Modified Fluid consistency: Thin; Room service appropriate? Yes  Diet effective now              liquids Your height is:    Your current weight is: Weight: 68.9 kg Your Body Mass Index (BMI) is:  BMI (Calculated): 26.68  Following  the type of diet specifically designed for you will help prevent another stroke.  Your goal weight range is:    Your goal Body Mass Index (BMI) is 19-24.  Healthy food habits can help reduce 3 risk factors for stroke:  High cholesterol, hypertension, and excess weight.  RESOURCES Stroke/Support Group:  Call [phone]   STROKE EDUCATION PROVIDED/REVIEWED AND GIVEN TO PATIENT Stroke warning signs and symptoms How to activate emergency medical system (call 911). Medications prescribed at discharge. Need for follow-up after discharge. Personal risk factors for stroke. Pneumonia vaccine given:  Flu vaccine given:  My questions have been answered, the writing is legible, and I understand these instructions.  I will adhere to these goals & educational materials that have been provided to me after my discharge from the hospital.      My questions have been answered and I understand these instructions. I will adhere to these goals and the provided educational materials after my discharge from the hospital.  Patient/Caregiver Signature _______________________________ Date __________  Clinician Signature _______________________________________ Date __________  Please bring this form and your medication list with you to all your follow-up doctor's appointments.

## 2018-03-13 NOTE — Progress Notes (Signed)
[facility] PHYSICAL MEDICINE & REHABILITATION     PROGRESS NOTE  Subjective/Complaints:   Discussed BP meds with pt's daughter   ROS: Patient deniesCP, SOB, N/V/D Objective: Vital Signs: Blood pressure (!) 149/57, pulse 71, temperature 98.2 F (36.8 C), temperature source Oral, resp. rate 16, height 5' 3.25" (1.607 m), weight 69.1 kg, SpO2 96 %. No results found. Recent Labs    03/11/18 0520  WBC 8.3  HGB 10.7*  HCT 32.6*  PLT 264   Recent Labs    03/11/18 0520  NA 141  K 4.1  CL 115*  GLUCOSE 138*  BUN 37*  CREATININE 1.62*  CALCIUM 8.9   CBG (last 3)  Recent Labs    03/12/18 1704 03/12/18 2149 03/13/18 0632  GLUCAP 136* 133* 105*    Wt Readings from Last 3 Encounters:  03/11/18 69.1 kg  02/24/18 71.3 kg  02/24/18 71.4 kg    Physical Exam:  BP (!) 149/57 (BP Location: Right Arm)   Pulse 71   Temp 98.2 F (36.8 C) (Oral)   Resp 16   Ht 5' 3.25" (1.607 m)   Wt 69.1 kg   SpO2 96%   BMI 26.77 kg/m  Constitutional: No distress . Vital signs reviewed. HEENT: EOMI, oral membranes moist Neck: supple Cardiovascular: RRR without murmur. No JVD    Respiratory: CTA Bilaterally without wheezes or rales. Normal effort    GI: BS +, non-tender, non-distended  Musculoskeletal: No edema or tenderness in extremities Neurological: She is alert and oriented Motor: LUE 3/5 prox to distal (stable) LLE 4/5 prox to distal (stable) RUE/RLE 5/5 proximal to distal.  Skin: Skin is warm. Intact. Psychiatric: Pleasant and cooperative.   Assessment/Plan: 1. 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 Physiatrist is providing close team supervision and 24 hour management of active medical problems listed below. Physiatrist and rehab team continue to assess barriers to discharge/monitor patient progress toward  functional and medical goals.  Function:  Bathing Bathing position   Position: Shower  Bathing parts Body parts bathed by patient: Chest, Abdomen, Front perineal area, Right upper leg, Left upper leg, Left arm, Right lower leg, Left lower leg, Right arm, Buttocks Body parts bathed by helper: Back  Bathing assist Assist Level: Supervision or verbal cues      Upper Body Dressing/Undressing Upper body dressing   What is the patient wearing?: Pull over shirt/dress     Pull over shirt/dress - Perfomed by patient: Thread/unthread right sleeve, Thread/unthread left sleeve, Put head through opening, Pull shirt over trunk Pull over shirt/dress - Perfomed by helper: Thread/unthread right sleeve        Upper body assist Assist Level: Supervision or verbal cues      Lower Body Dressing/Undressing Lower body dressing   What is the patient wearing?: Pants, Non-skid slipper socks, Shoes, Underwear Underwear - Performed by patient: Thread/unthread left underwear leg, Pull underwear up/down, Thread/unthread right underwear leg Underwear - Performed by helper: Thread/unthread right underwear leg Pants- Performed by patient: Thread/unthread right pants leg, Thread/unthread left pants leg, Pull pants up/down Pants- Performed by helper: Thread/unthread right pants leg Non-skid slipper socks- Performed by patient: Don/doff right sock, Don/doff left sock Non-skid slipper socks- Performed by helper: Don/doff right sock, Don/doff left sock     Shoes - Performed by patient: Don/doff right shoe, Don/doff left shoe Shoes - Performed by helper: Don/doff left  shoe          Lower body assist Assist for lower body dressing: Touching or steadying assistance (Pt > 75%)      Toileting Toileting   Toileting steps completed by patient: Adjust clothing prior to toileting, Performs perineal hygiene, Adjust clothing after toileting Toileting steps completed by helper: Adjust clothing after  toileting Toileting Assistive Devices: Grab bar or rail  Toileting assist Assist level: Touching or steadying assistance (Pt.75%)   Transfers Chair/bed transfer   Chair/bed transfer method: Ambulatory Chair/bed transfer assist level: Touching or steadying assistance (Pt > 75%) Chair/bed transfer assistive device: Librarian, academic     Max distance: 12 Assist level: Touching or steadying assistance (Pt > 75%)   Wheelchair   Type: Motorized Max wheelchair distance: 150' Assist Level: Supervision or verbal cues  Cognition Comprehension Comprehension assist level: Follows basic conversation/direction with no assist  Expression Expression assist level: Expresses basic needs/ideas: With no assist  Social Interaction Social Interaction assist level: Interacts appropriately 90% of the time - Needs monitoring or encouragement for participation or interaction.  Problem Solving Problem solving assist level: Solves basic 90% of the time/requires cueing < 10% of the time  Memory Memory assist level: Recognizes or recalls 75 - 89% of the time/requires cueing 10 - 24% of the time     Medical Problem List and Plan:  1. 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 DVT Prophylaxis/Anticoagulation: SCDs  3. Pain Management: N/A  4. Mood: LCSW to follow for evaluation and support.  5. Neuropsych: This patient is capable of making decisions on his own behalf.  6. Skin/Wound Care: routine pressure relief measures.  7. Fluids/Electrolytes/Nutrition: Patient is pushing p.o. fluids   -BUN and creatinine with improvement to 36/1.6 , looks to be near baseline  -recheck BMET 1-2 wk in MD office 8. HTN: Difficult to control PTA.     Hydralazine 10 mg every 8 when necessary   Vitals:   03/12/18 2132 03/13/18 0433  BP: 122/88 (!) 149/57   Pulse: 83 71  Resp: 19 16  Temp: 98.3 F (36.8 C) 98.2 F (36.8 C)  SpO2: 97% 96%   Improved 9/6 Off lisinopril and increase amlodipine to 10 mg 9/5 9. T2DM: Hgb A1C- 7.3. Check blood sugars before meals and at bedtime.   Patient had been on Glucophage 500 mg twice daily prior to admission     CBG (last 3)  Recent Labs    03/12/18 1704 03/12/18 2149 03/13/18 0632  GLUCAP 136* 133* 105*  controlled 9/6- not on metformin         10. CKD: Baseline SCr 1.6. Encourage fluid intake. Monitor renal status with serial checks for stability.   -IV fluids stopped,at baseline now  11.  Hgb 10.9gm- baseline ~12 ,  stool OB-, no hematochezia     LOS (Days) 14 A FACE TO FACE EVALUATION WAS PERFORMED   Charlett Blake 03/13/2018 8:22 AM

## 2018-03-14 DIAGNOSIS — I63419 Cerebral infarction due to embolism of unspecified middle cerebral artery: Secondary | ICD-10-CM

## 2018-03-14 LAB — GLUCOSE, CAPILLARY: GLUCOSE-CAPILLARY: 109 mg/dL — AB (ref 70–99)

## 2018-03-14 NOTE — Progress Notes (Signed)
Patient ID: [name], female   DOB: 17-Jun-1946, 72 y.o.   MRN: [id number]   [name] is a 72 y.o. female who was admitted for CIR with left-sided weakness secondary to a right insular foci embolic stroke   Subjective: No new complaints. No new problems.  Doing quite well.  Daughter at bedside and excited about today's anticipated discharge  Objective: Vital signs in last 24 hours: Temp:  [97.6 F (36.4 C)-98 F (36.7 C)] 98 F (36.7 C) (09/07 0712) Pulse Rate:  [65-75] 65 (09/07 0712) Resp:  [16] 16 (09/07 0712) BP: (154-160)/(59-62) 154/62 (09/07 0712) SpO2:  [97 %-98 %] 98 % (09/07 0712) Weight change:  Last BM Date: 03/13/18(x2 per pt report)  Intake/Output from previous day: 09/06 0701 - 09/07 0700 In: 1080 [P.O.:1080] Out: 1 [Urine:1] Last cbgs: CBG (last 3)  Recent Labs    03/13/18 1627 03/13/18 2210 03/14/18 0716  GLUCAP 117* 157* 109*     Physical Exam General: No apparent distress   HEENT: not dry Lungs: Normal effort. Lungs clear to auscultation, no crackles or wheezes. Cardiovascular: Regular rate and rhythm, no edema Abdomen: S/NT/ND; BS(+) Musculoskeletal:  unchanged Neurological: No new neurological deficits.  Very minimal left-sided weakness Wounds: N/A    Skin: clear  Aging changes Mental state: Alert, oriented, cooperative    Lab Results: BMET    Component Value Date/Time   NA 141 03/11/2018 0520   K 4.1 03/11/2018 0520   CL 115 (H) 03/11/2018 0520   CO2 18 (L) 03/11/2018 0520   GLUCOSE 138 (H) 03/11/2018 0520   BUN 37 (H) 03/11/2018 0520   CREATININE 1.62 (H) 03/11/2018 0520   CALCIUM 8.9 03/11/2018 0520   GFRNONAA 31 (L) 03/11/2018 0520   GFRAA 36 (L) 03/11/2018 0520   CBC    Component Value Date/Time   WBC 8.3 03/11/2018 0520   RBC 3.87 03/11/2018 0520   HGB 10.7 (L) 03/11/2018 0520   HCT 32.6 (L) 03/11/2018 0520   PLT 264 03/11/2018 0520   MCV 84.2 03/11/2018 0520   MCH 27.6 03/11/2018 0520   MCHC 32.8  03/11/2018 0520   RDW 13.3 03/11/2018 0520   LYMPHSABS 2.3 03/02/2018 0545   MONOABS 0.8 03/02/2018 0545   EOSABS 0.4 03/02/2018 0545   BASOSABS 0.1 03/02/2018 0545     Medications: I have reviewed the patient's current medications.  Assessment/Plan:  Status post embolic stroke with improving left-sided weakness.  Stable for discharge Essential hypertension stable.  Continue present regimen Diabetes mellitus type 2   Length of stay, days: 15  Marletta Lor , MD 03/14/2018, 8:05 AM

## 2018-03-14 NOTE — Progress Notes (Signed)
Patient and patient's daughter were given discharge instructions and voiced understanding. Patient was wheeled down with daughter and nurse tech with all her personal belongings. Nicholes Rough, LPN

## 2018-03-16 ENCOUNTER — Telehealth: Payer: Self-pay | Admitting: Family Medicine

## 2018-03-16 ENCOUNTER — Telehealth: Payer: Self-pay | Admitting: Behavioral Health

## 2018-03-16 NOTE — Telephone Encounter (Signed)
Unable to reach patient at time of TCM/Hospital Follow-up Call. Left message for patient to return call when available.

## 2018-03-16 NOTE — Telephone Encounter (Signed)
Copied from [name]. Topic: General - Other >> Mar 16, 2018  9:58 AM Margot Ables wrote: Reason for CRM: requesting orders to continue home PT 2x week for 2-3 weeks.  Secure VM if no answer

## 2018-03-16 NOTE — Telephone Encounter (Signed)
Transition Care Management Follow-up Telephone Call   Date discharged? 03/14/18   How have you been since you were released from the hospital? Patient stated, "I'm doing pretty good; keeping blood sugars in control".   Do you understand why you were in the hospital? yes   Do you understand the discharge instructions? yes   Where were you discharged to? Home   Items Reviewed:  Medications reviewed: yes  Allergies reviewed: yes  Dietary changes reviewed: yes, diabetic diet.   Referrals reviewed: yes, follow-up with PCP on 03/23/18 & Neurology in 6 weeks.   Functional Questionnaire:   Activities of Daily Living (ADLs):   She states they are independent in the following: ambulation, bathing and hygiene, feeding, continence, grooming, toileting and dressing States they require assistance with the following: None    Any transportation issues/concerns?: no   Any patient concerns? no   Confirmed importance and date/time of follow-up visits scheduled yes, 03/23/18 at 11:00 AM.  Provider Appointment booked with Dr. Deborra Medina.  Confirmed with patient if condition begins to worsen call PCP or go to the ER.  Patient was given the office number and encouraged to call back with question or concerns.  : yes

## 2018-03-16 NOTE — Telephone Encounter (Signed)
Okay to give verbal order as requested.

## 2018-03-16 NOTE — Telephone Encounter (Signed)
Verbal order ok given.

## 2018-03-16 NOTE — Telephone Encounter (Signed)
Please advise 

## 2018-03-16 NOTE — Addendum Note (Signed)
Addended by: Eduard Roux E on: 03/16/2018 11:53 AM   Modules accepted: Orders

## 2018-03-17 ENCOUNTER — Other Ambulatory Visit: Payer: Self-pay

## 2018-03-17 ENCOUNTER — Other Ambulatory Visit: Payer: Self-pay | Admitting: *Deleted

## 2018-03-17 ENCOUNTER — Telehealth: Payer: Self-pay | Admitting: Family Medicine

## 2018-03-17 MED ORDER — LANCET DEVICE MISC: 99 refills | Status: DC

## 2018-03-17 MED ORDER — BLOOD GLUCOSE MONITOR KIT: PACK | 0 refills | Status: DC

## 2018-03-17 MED ORDER — GLUCOSE BLOOD VI STRP: ORAL_STRIP | 12 refills | Status: DC

## 2018-03-17 MED ORDER — BLOOD PRESSURE MONITOR AUTOMAT DEVI: 0 refills | Status: DC

## 2018-03-17 NOTE — Telephone Encounter (Signed)
I faxed this/thx dmf

## 2018-03-17 NOTE — Telephone Encounter (Signed)
Please fax over Rx for glucose meter- will not let us change original from print to normal.

## 2018-03-17 NOTE — Telephone Encounter (Signed)
Copied from [place] [id number]. Topic: Quick Communication - Rx Refill/Question >> Mar 17, 2018 12:18 PM Jodie Echevaria wrote: Meryl Dare from [facility] called to request Rx faxed/ E scripted over for a new generic Meter. Received Rx for test strips and lancets no name specified   Preferred Pharmacy (with phone number or street name): [facility] ([address] [place] [phone] (Phone) [phone] (Fax)    Agent: Please be advised that RX refills may take up to 3 business days. We ask that you follow-up with your pharmacy.

## 2018-03-20 ENCOUNTER — Inpatient Hospital Stay: Payer: Medicare Other | Admitting: Registered Nurse

## 2018-03-23 ENCOUNTER — Ambulatory Visit: Payer: Medicare Other | Admitting: Family Medicine

## 2018-03-23 ENCOUNTER — Encounter: Payer: Self-pay | Admitting: Family Medicine

## 2018-03-23 VITALS — BP 146/60 | HR 83 | Temp 98.3°F | Ht 63.25 in | Wt 157.4 lb

## 2018-03-23 DIAGNOSIS — E785 Hyperlipidemia, unspecified: Secondary | ICD-10-CM | POA: Diagnosis not present

## 2018-03-23 DIAGNOSIS — N183 Chronic kidney disease, stage 3 unspecified: Secondary | ICD-10-CM

## 2018-03-23 DIAGNOSIS — G8194 Hemiplegia, unspecified affecting left nondominant side: Secondary | ICD-10-CM

## 2018-03-23 DIAGNOSIS — E7800 Pure hypercholesterolemia, unspecified: Secondary | ICD-10-CM | POA: Insufficient documentation

## 2018-03-23 DIAGNOSIS — Z8673 Personal history of transient ischemic attack (TIA), and cerebral infarction without residual deficits: Secondary | ICD-10-CM

## 2018-03-23 DIAGNOSIS — E119 Type 2 diabetes mellitus without complications: Secondary | ICD-10-CM

## 2018-03-23 DIAGNOSIS — Z09 Encounter for follow-up examination after completed treatment for conditions other than malignant neoplasm: Secondary | ICD-10-CM | POA: Insufficient documentation

## 2018-03-23 DIAGNOSIS — I10 Essential (primary) hypertension: Secondary | ICD-10-CM

## 2018-03-23 DIAGNOSIS — R4586 Emotional lability: Secondary | ICD-10-CM

## 2018-03-23 DIAGNOSIS — I639 Cerebral infarction, unspecified: Secondary | ICD-10-CM

## 2018-03-23 LAB — CBC WITH DIFFERENTIAL/PLATELET
BASOS PCT: 1.3 % (ref 0.0–3.0)
Basophils Absolute: 0.1 10*3/uL (ref 0.0–0.1)
EOS ABS: 0.4 10*3/uL (ref 0.0–0.7)
Eosinophils Relative: 4.1 % (ref 0.0–5.0)
HCT: 35.9 % — ABNORMAL LOW (ref 36.0–46.0)
HEMOGLOBIN: 12 g/dL (ref 12.0–15.0)
Lymphocytes Relative: 17.6 % (ref 12.0–46.0)
Lymphs Abs: 1.7 10*3/uL (ref 0.7–4.0)
MCHC: 33.4 g/dL (ref 30.0–36.0)
MCV: 82.6 fl (ref 78.0–100.0)
MONO ABS: 0.8 10*3/uL (ref 0.1–1.0)
Monocytes Relative: 8.3 % (ref 3.0–12.0)
NEUTROS PCT: 68.7 % (ref 43.0–77.0)
Neutro Abs: 6.7 10*3/uL (ref 1.4–7.7)
PLATELETS: 348 10*3/uL (ref 150.0–400.0)
RBC: 4.35 Mil/uL (ref 3.87–5.11)
RDW: 14.4 % (ref 11.5–15.5)
WBC: 9.7 10*3/uL (ref 4.0–10.5)

## 2018-03-23 LAB — COMPREHENSIVE METABOLIC PANEL
ALBUMIN: 4 g/dL (ref 3.5–5.2)
ALT: 15 U/L (ref 0–35)
AST: 15 U/L (ref 0–37)
Alkaline Phosphatase: 112 U/L (ref 39–117)
BUN: 39 mg/dL — AB (ref 6–23)
CHLORIDE: 103 meq/L (ref 96–112)
CO2: 25 mEq/L (ref 19–32)
Calcium: 9.5 mg/dL (ref 8.4–10.5)
Creatinine, Ser: 1.79 mg/dL — ABNORMAL HIGH (ref 0.40–1.20)
GFR: 29.52 mL/min — ABNORMAL LOW (ref >60.00)
Glucose, Bld: 147 mg/dL — ABNORMAL HIGH (ref 70–99)
Potassium: 4.5 mEq/L (ref 3.5–5.1)
SODIUM: 134 meq/L — AB (ref 135–145)
TOTAL PROTEIN: 6.9 g/dL (ref 6.0–8.3)
Total Bilirubin: 0.3 mg/dL (ref 0.2–1.2)

## 2018-03-23 LAB — LIPID PANEL
CHOLESTEROL: 189 mg/dL (ref 0–200)
HDL: 42.3 mg/dL (ref >39.00)
LDL CALC: 116 mg/dL — AB (ref 0–99)
NonHDL: 146.56
TRIGLYCERIDES: 151 mg/dL — AB (ref 0.0–149.0)
Total CHOL/HDL Ratio: 4
VLDL: 30.2 mg/dL (ref 0.0–40.0)

## 2018-03-23 NOTE — Assessment & Plan Note (Signed)
Continue statin. Check lipid panel today. 

## 2018-03-23 NOTE — Assessment & Plan Note (Signed)
Improved with zoloft. No changes made.

## 2018-03-23 NOTE — Patient Instructions (Signed)
Great to see you. I will call you with your lab results from today and you can view them online.   Please come see me in 2 months.

## 2018-03-23 NOTE — Assessment & Plan Note (Signed)
Has been diet controlled, continues to follow strict diabetic diet. Follow up in 2 months to repeat a1c/follow up diabetes. The patient indicates understanding of these issues and agrees with the plan.

## 2018-03-23 NOTE — Progress Notes (Signed)
Subjective:   Patient ID: [name], female    DOB: 05-27-1946, 72 y.o.   MRN: [id number]  [name] is a pleasant 72 y.o. year old female who presents to clinic today with Hospitalization Follow-up (Patient is here today for a Hospital F/U.  She was in [facility] from 8.23.19-9.7.19 after being Dx with Emboli Stroke, Left Hemiparesis, Hypertensive Crisis and Meds for D/C included Norvasc,  Allegra, and Tylenol.  Only to continue Plavix until 9.14 then D/C and cont asa and get BMP in1 wk.  Glucophage on hold with monitoring of renal function on DM diet and to F/U with Dr. Letta Pate at [facility], Dr. Leonie Man for [place].  Glucometer was sent in for pt on 9.10.19.  She is getting much stronger with PT.  Has ) and addendum (improved with her standing, BP, and BS. States that she was put on Zoloft '50mg'$  1qd in the hospital and prescribed her 90d.  She declines Flu and PNV-23 vaccines today.)  on 03/23/2018  HPI:  Here for hospital and inpatient rehab follow up.  Was admitted to [facility] from 02/24/18- 03/14/18 after being diagnosed with hypertensive emergency and embolic stroke. Notes reviewed.  Embolic CVA- followed by Dr. Leonie Man. Worked in inpatient rehab with PT/OT and has regained some of the strength on her left side. Was advised to continue plavix only until 03/21/18 and continue only ASA 81 mg daily.  Also discharged on lipitor 40 mg daily. Has not yet started home PT/OT. Lab Results  Component Value Date   CHOL 245 (H) 02/26/2018   HDL 42 02/26/2018   LDLCALC 141 (H) 02/26/2018   LDLDIRECT 138.0 09/02/2017   TRIG 308 (H) 02/26/2018   CHOLHDL 5.8 02/26/2018    [name] Head Wo Contrast  Result Date: 02/24/2018 CLINICAL DATA:  72 y/o F; 2 days of left arm numbness and history of hypertension. EXAM: MRI HEAD WITHOUT CONTRAST MRA HEAD WITHOUT CONTRAST TECHNIQUE: Multiplanar, multiecho pulse sequences of the brain and surrounding structures were obtained without intravenous contrast.  Angiographic images of the head were obtained using MRA technique without contrast. COMPARISON:  None. FINDINGS: MRI HEAD FINDINGS Brain: Clustered small foci of reduced diffusion compatible with acute/early subacute infarction in the right superior frontoparietal junction spanning 5.5 x 2.7 x 3.7 cm (AP x ML x CC) additional punctate focus within the right insula. No associated hemorrhage or mass effect. Small chronic infarctions are present within the left cerebellar hemisphere, bilateral lentiform nuclei, and the right anterior corona radiata. Several nonspecific T2 FLAIR hyperintensities in subcortical and periventricular white matter are compatible with mild to moderate chronic microvascular ischemic changes for age. Mild to moderate volume loss of the brain. No abnormal susceptibility hypointensity to indicate intracranial hemorrhage. No extra-axial collection, hydrocephalus, focal mass effect, or herniation. Vascular: As below. Skull and upper cervical spine: Normal marrow signal. Sinuses/Orbits: Negative. Other: Bilateral intra-ocular lens replacement. MRA HEAD FINDINGS Internal carotid arteries:  Patent. Anterior cerebral arteries: Patent. Right A3 tandem segment of moderate to severe stenosis. Middle cerebral arteries: Patent. Proximal right M2 superior division short segment of severe stenosis/near occlusion (series 6, image 110). Anterior communicating artery: Patent. Posterior communicating arteries: Not identified, likely hypoplastic or absent. Posterior cerebral arteries: Patent. Mild right P2 and mild left P1 short segments of stenosis. Basilar artery:  Patent. Vertebral arteries:  Patent. No additional evidence of high-grade stenosis, large vessel occlusion, or aneurysm. IMPRESSION: MRI head: 1. Clustered small foci of acute/early subacute infarction within the right superior  frontoparietal junction spanning up to 5.5 cm. Additional punctate infarct in the right insula. No associated hemorrhage or  mass effect. 2. Background of multiple small chronic infarctions and cerebellum and basal ganglia. Mild-to-moderate chronic microvascular ischemic changes and volume loss of the brain for age. MRA head: 1. Proximal right M2 superior division short segment of severe stenosis/near occlusion. 2. Tandem segments of moderate to severe stenosis in the right A3. 3. Mild right P2 and left P1 short segments of stenosis. 4. No additional large vessel occlusion, high-grade stenosis, or aneurysm identified. These results were called by telephone at the time of interpretation on 02/24/2018 at 5:29 pm to Dr. Lita Mains, who verbally acknowledged these results. Electronically Signed   By: Kristine Garbe M.D.   On: 02/24/2018 17:32   [name] Contrast  Result Date: 02/24/2018 CLINICAL DATA:  72 y/o F; 2 days of left arm numbness and history of hypertension. EXAM: MRI HEAD WITHOUT CONTRAST MRA HEAD WITHOUT CONTRAST TECHNIQUE: Multiplanar, multiecho pulse sequences of the brain and surrounding structures were obtained without intravenous contrast. Angiographic images of the head were obtained using MRA technique without contrast. COMPARISON:  None. FINDINGS: MRI HEAD FINDINGS Brain: Clustered small foci of reduced diffusion compatible with acute/early subacute infarction in the right superior frontoparietal junction spanning 5.5 x 2.7 x 3.7 cm (AP x ML x CC) additional punctate focus within the right insula. No associated hemorrhage or mass effect. Small chronic infarctions are present within the left cerebellar hemisphere, bilateral lentiform nuclei, and the right anterior corona radiata. Several nonspecific T2 FLAIR hyperintensities in subcortical and periventricular white matter are compatible with mild to moderate chronic microvascular ischemic changes for age. Mild to moderate volume loss of the brain. No abnormal susceptibility hypointensity to indicate intracranial hemorrhage. No extra-axial collection,  hydrocephalus, focal mass effect, or herniation. Vascular: As below. Skull and upper cervical spine: Normal marrow signal. Sinuses/Orbits: Negative. Other: Bilateral intra-ocular lens replacement. MRA HEAD FINDINGS Internal carotid arteries:  Patent. Anterior cerebral arteries: Patent. Right A3 tandem segment of moderate to severe stenosis. Middle cerebral arteries: Patent. Proximal right M2 superior division short segment of severe stenosis/near occlusion (series 6, image 110). Anterior communicating artery: Patent. Posterior communicating arteries: Not identified, likely hypoplastic or absent. Posterior cerebral arteries: Patent. Mild right P2 and mild left P1 short segments of stenosis. Basilar artery:  Patent. Vertebral arteries:  Patent. No additional evidence of high-grade stenosis, large vessel occlusion, or aneurysm. IMPRESSION: MRI head: 1. Clustered small foci of acute/early subacute infarction within the right superior frontoparietal junction spanning up to 5.5 cm. Additional punctate infarct in the right insula. No associated hemorrhage or mass effect. 2. Background of multiple small chronic infarctions and cerebellum and basal ganglia. Mild-to-moderate chronic microvascular ischemic changes and volume loss of the brain for age. MRA head: 1. Proximal right M2 superior division short segment of severe stenosis/near occlusion. 2. Tandem segments of moderate to severe stenosis in the right A3. 3. Mild right P2 and left P1 short segments of stenosis. 4. No additional large vessel occlusion, high-grade stenosis, or aneurysm identified. These results were called by telephone at the time of interpretation on 02/24/2018 at 5:29 pm to Dr. Lita Mains, who verbally acknowledged these results. Electronically Signed   By: Kristine Garbe M.D.   On: 02/24/2018 17:32    HTN- has been improved now with Norvasc 10 mg daily. Denies HA, blurred vision, CP or SOB. Lab Results  Component Value Date   CREATININE  1.62 (H) 03/11/2018   BP  Readings from Last 3 Encounters:  03/23/18 (!) 146/60  03/14/18 (!) 154/62  02/27/18 (!) 159/78     DM- advised to hold glucophage due to kidney function and FSBS were controlled in the hospital. Lab Results  Component Value Date   HGBA1C 7.4 (H) 02/26/2018   Check FSBS twice daily- fasting is usually around 120.  Wt Readings from Last 3 Encounters:  03/23/18 157 lb 6.4 oz (71.4 kg)  03/11/18 152 lb 5.4 oz (69.1 kg)  02/24/18 157 lb 3 oz (71.3 kg)   Mood swings- zoloft 50 mg daily started in the hospital.  She feels it is working well as does her daughter.   Current Outpatient Medications on File Prior to Visit  Medication Sig Dispense Refill  . Aflibercept (EYLEA) 2 MG/0.05ML SOLN 1 each by Intravitreal route every 3 (three) months.     Marland Kitchen amLODipine (NORVASC) 10 MG tablet Take 1 tablet (10 mg total) by mouth daily. 30 tablet 1  . ARNICA EX Take 1 tablet by mouth daily as needed (For pain.).     Marland Kitchen aspirin EC 81 MG EC tablet Take 1 tablet (81 mg total) by mouth daily. 30 tablet 1  . atorvastatin (LIPITOR) 40 MG tablet Take 1 tablet (40 mg total) by mouth daily at 6 PM. 30 tablet 0  . blood glucose meter kit and supplies KIT Dispense based on patient and insurance preference. UAD bid for glucose monitoring Dx: E11.9 1 each 0  . Blood Pressure Monitoring (BLOOD PRESSURE MONITOR AUTOMAT) DEVI UAD for BP monitoring; Dx: I10 & I16.0 1 Device 0  . diclofenac sodium (VOLTAREN) 1 % GEL Apply 2 g topically 4 (four) times daily. (Patient taking differently: Apply 2 g topically 4 (four) times daily as needed (For pain.). ) 100 g 0  . glucose blood test strip UAD bid for glucose monitoring Dx: E11.9 100 each 12  . Lancet Device MISC UAD bid for glucose monitoring Dx: E11.9 1 each prn  . loratadine (CLARITIN) 10 MG tablet Take 10 mg by mouth daily.     . sertraline (ZOLOFT) 50 MG tablet Take 50 mg by mouth daily.    . TURMERIC PO Take 1 tablet by mouth daily.     No  current facility-administered medications on file prior to visit.     Allergies  Allergen Reactions  . Clonidine Anaphylaxis, Swelling and Other (See Comments)    Tongue swelling  . Codeine Itching and Rash    Past Medical History:  Diagnosis Date  . Allergy   . Arthritis   . Cataract   . Diabetes mellitus without complication ([place])   . Hypertension   . Macular degeneration syndrome    with edema---being treated.     Past Surgical History:  Procedure Laterality Date  . callous removal  Left 2017   located on 1st digit on L foot 2/2 diabetes  . EYE SURGERY      Family History  Problem Relation Age of Onset  . Kidney disease Mother   . Congestive Heart Failure Father   . COPD Father   . COPD Brother   . Diabetes Maternal Aunt     Social History   Socioeconomic History  . Marital status: Married    Spouse name: Rennis Harding  . Number of children: 2  . 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 Highest education level: High school graduate  Occupational History  . Not on file  Social  Needs  . Financial resource strain: Somewhat hard  . Food insecurity:    Worry: Never true    Inability: Never true  . Transportation needs:    Medical: No    Non-medical: No  Tobacco Use  . Smoking status: Never Smoker  . Smokeless tobacco: Never Used  Substance and Sexual Activity  . Alcohol use: No  . Drug use: No  . Sexual activity: Not Currently  Lifestyle  . Physical activity:    Days per week: 0 days    Minutes per session: 0 min  . Stress: Not at all  Relationships  . Social connections:    Talks on phone: More than three times a week    Gets together: Twice a week    Attends religious service: Never    Active member of club or organization: No    Attends meetings of clubs or organizations: Never    Relationship status: Married  . Intimate partner violence:    Fear of current or ex partner: No    Emotionally abused: No     Physically abused: No    Forced sexual activity: No  Other Topics Concern  . Not on file  Social History Narrative  . Not on file   The PMH, PSH, Social History, Family History, Medications, and allergies have been reviewed in [facility], and have been updated if relevant.   Review of Systems  Constitutional: Negative.   HENT: Negative.   Eyes: Negative.   Respiratory: Negative.   Cardiovascular: Negative.   Gastrointestinal: Negative.   Endocrine: Negative.   Genitourinary: Negative.   Musculoskeletal: Negative.   Skin: Negative.   Allergic/Immunologic: Negative.   Neurological: Positive for weakness. Negative for tremors, seizures, syncope, facial asymmetry, speech difficulty, light-headedness, numbness and headaches.  Hematological: Negative.   Psychiatric/Behavioral: Negative.   All other systems reviewed and are negative.      Objective:    BP (!) 146/60 (BP Location: Right Arm, Cuff Size: Normal)   Pulse 83   Temp 98.3 F (36.8 C) (Oral)   Ht 5' 3.25" (1.607 m)   Wt 157 lb 6.4 oz (71.4 kg)   SpO2 96%   BMI 27.66 kg/m    Physical Exam  Constitutional: She appears well-developed and well-nourished. No distress.  In wheelchair  HENT:  Head: Normocephalic and atraumatic.  Eyes: EOM are normal.  Neck: Normal range of motion.  Cardiovascular: Normal rate and regular rhythm.  Pulmonary/Chest: Effort normal.  Musculoskeletal: She exhibits no edema.  Neurological: She is alert. No cranial nerve deficit or sensory deficit.  Decreased grip strength and ROM of left arm  Skin: Skin is warm and dry. She is not diaphoretic.  Psychiatric: She has a normal mood and affect. Her behavior is normal. Judgment and thought content normal.  Nursing note and vitals reviewed.         Assessment & Plan:   Acute CVA (cerebrovascular accident) ([place]) - Plan: CBC with Differential/Platelet  CKD (chronic kidney disease), stage III ([place]) - Plan: Comprehensive metabolic  panel  Diabetes mellitus type 2 in nonobese [facility])  Left hemiparesis [facility])  Hospital discharge follow-up  Hyperlipidemia, unspecified hyperlipidemia type - Plan: Lipid panel  Mood swings No follow-ups on file.

## 2018-03-23 NOTE — Assessment & Plan Note (Signed)
Continue ASA, statin. Has not yet started Home PT/OT. Has follow up scheduled with neuro, per pts's daughter.

## 2018-03-23 NOTE — Assessment & Plan Note (Signed)
Stable- check CMET today.

## 2018-03-23 NOTE — Assessment & Plan Note (Signed)
Improved with norvasc. Continue to monitor at home. Follow up in 2 months.

## 2018-03-24 ENCOUNTER — Encounter: Payer: Self-pay | Admitting: Family Medicine

## 2018-03-24 DIAGNOSIS — N183 Chronic kidney disease, stage 3 unspecified: Secondary | ICD-10-CM

## 2018-03-31 ENCOUNTER — Telehealth: Payer: Self-pay | Admitting: Family Medicine

## 2018-03-31 NOTE — Telephone Encounter (Signed)
Copied from [place] [id number]. Topic: General - Other >> Mar 31, 2018  8:45 AM Lennox Solders wrote: Reason for CRM: mike OT with [facility] is calling needing  verbal ok for plan of care for once a wk for 1 and then twice a wk for 4 wks. Pt had a stroke and he will provide electric simulation and neuro muscular rehab

## 2018-03-31 NOTE — Telephone Encounter (Signed)
Yes okay to give verbal order as requested.

## 2018-03-31 NOTE — Telephone Encounter (Signed)
Ronalee Belts at [facility] is aware and verbal order given/thx dmf

## 2018-03-31 NOTE — Telephone Encounter (Signed)
TA-May I give verbal for OT? Plz advise/thx dmf

## 2018-04-01 ENCOUNTER — Telehealth: Payer: Self-pay | Admitting: Family Medicine

## 2018-04-01 NOTE — Telephone Encounter (Signed)
TA-I gave verbal order for the VO to extend as stated below/plz advise if I need to retract/thx dmf

## 2018-04-01 NOTE — Telephone Encounter (Signed)
Noted! Thank you

## 2018-04-01 NOTE — Telephone Encounter (Signed)
Copied from [place] [phone]. Topic: General - Other >> Apr 01, 2018  2:29 PM Margot Ables wrote: Reason for CRM: [facility] has been seeing pt for 3 weeks. Today was last visit he has orders for. Requesting VO to extend for 3 more weeks (2x week 2 week, 1x week 1 week). Secure VM if not able to answer.

## 2018-04-08 ENCOUNTER — Ambulatory Visit: Payer: Medicare Other | Admitting: Family Medicine

## 2018-04-09 NOTE — Telephone Encounter (Signed)
Referral placed.  Someone will call her with appt/name of nephrologist. I do not know who she will be seeing at this point.

## 2018-04-13 ENCOUNTER — Encounter: Payer: Self-pay | Admitting: Family Medicine

## 2018-04-13 ENCOUNTER — Ambulatory Visit: Payer: Medicare Other | Admitting: Family Medicine

## 2018-04-13 VITALS — BP 166/66 | HR 73 | Temp 99.0°F | Ht 63.25 in | Wt 160.8 lb

## 2018-04-13 DIAGNOSIS — J0100 Acute maxillary sinusitis, unspecified: Secondary | ICD-10-CM

## 2018-04-13 DIAGNOSIS — I10 Essential (primary) hypertension: Secondary | ICD-10-CM

## 2018-04-13 DIAGNOSIS — N183 Chronic kidney disease, stage 3 unspecified: Secondary | ICD-10-CM

## 2018-04-13 DIAGNOSIS — R600 Localized edema: Secondary | ICD-10-CM

## 2018-04-13 DIAGNOSIS — J019 Acute sinusitis, unspecified: Secondary | ICD-10-CM | POA: Insufficient documentation

## 2018-04-13 MED ORDER — AZITHROMYCIN 250 MG PO TABS: ORAL_TABLET | ORAL | 0 refills | Status: DC

## 2018-04-13 NOTE — Progress Notes (Signed)
Subjective:   Patient ID: [name], female    DOB: 06-27-1946, 72 y.o.   MRN: [id number]  [name] is a pleasant 72 y.o. year old female who presents to clinic today with Follow-up (Patient is here today to F/U with HTN.  Pt and family are concerned that the  Norvasc may be the causative of her BLE edema that is worse on the left and also LUE edema which states has improved.  Going on x2wks.  Daughter states that Systolic had been running 140-150 until Saturday it increased to 160-170.) and URI (Patient also states that has had an intermittently productive cough since 10.03.19 and sputum is clear.  Post nasal drip that is so bad at night that she has to get up and spit it out and that is clear.  States that she has periorbital sinus pressure that is moderate in nature. She takes Human resources officer and Flonase.)  on 04/13/2018  HPI:  Here with her daughter, [name].  HTN- blood pressure has been increased for past 3 days.  She has not been symptomatic other than sinus pressure- has had URI symptoms for over a week- cough, congestion, sinus pressure, tooth pain, fatigue without fever.  Prior to that, she was normotensive. Has not changed her diet. Following a low salt diet. She does not think the OTC allegra she has been taking has a decongestant (sudafed) in it.  She has also been using flonase.  Lab Results  Component Value Date   CREATININE 1.79 (H) 03/23/2018    LE edema- improving- had been worse a couple of weeks ago.  Keeps her legs elevated when seated.  Has not been wearing compression hose she was given in the hospital because she finds them to be uncomfortable.  CKD- GFR was 29 a few weeks ago.  We had scheduled her for nephology months ago but she did not make the appointment.  I placed another referral and she is waiting to hear from nephrology for an appointment.   Current Outpatient Medications on File Prior to Visit  Medication Sig Dispense Refill  . Aflibercept (EYLEA) 2  MG/0.05ML SOLN 1 each by Intravitreal route every 3 (three) months.     Marland Kitchen amLODipine (NORVASC) 10 MG tablet Take 1 tablet (10 mg total) by mouth daily. 30 tablet 1  . ARNICA EX Take 1 tablet by mouth daily as needed (For pain.).     Marland Kitchen blood glucose meter kit and supplies KIT Dispense based on patient and insurance preference. UAD bid for glucose monitoring Dx: E11.9 1 each 0  . Blood Pressure Monitoring (BLOOD PRESSURE MONITOR AUTOMAT) DEVI UAD for BP monitoring; Dx: I10 & I16.0 1 Device 0  . diclofenac sodium (VOLTAREN) 1 % GEL Apply 2 g topically 4 (four) times daily. (Patient taking differently: Apply 2 g topically 4 (four) times daily as needed (For pain.). ) 100 g 0  . fexofenadine (ALLEGRA) 180 MG tablet Take 180 mg by mouth daily.    . fluticasone (FLONASE) 50 MCG/ACT nasal spray Place 2 sprays into both nostrils daily.    Marland Kitchen glucose blood test strip UAD bid for glucose monitoring Dx: E11.9 100 each 12  . Lancet Device MISC UAD bid for glucose monitoring Dx: E11.9 1 each prn  . sertraline (ZOLOFT) 50 MG tablet Take 50 mg by mouth daily.    . TURMERIC PO Take 1 tablet by mouth daily.     No current facility-administered medications on file prior to visit.  Allergies  Allergen Reactions  . Clonidine Anaphylaxis, Swelling and Other (See Comments)    Tongue swelling  . Lisinopril Cough  . Codeine Itching and Rash    Past Medical History:  Diagnosis Date  . Allergy   . Arthritis   . Cataract   . Diabetes mellitus without complication ([place])   . Hypertension   . Macular degeneration syndrome    with edema---being treated.     Past Surgical History:  Procedure Laterality Date  . callous removal  Left 2017   located on 1st digit on L foot 2/2 diabetes  . EYE SURGERY      Family History  Problem Relation Age of Onset  . Kidney disease Mother   . Congestive Heart Failure Father   . COPD Father   . COPD Brother   . Diabetes Maternal Aunt     Social History    Socioeconomic History  . Marital status: Married    Spouse name: Rennis Harding  . Number of children: 2  . 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 Highest education level: High school graduate  Occupational History  . Not on file  Social Needs  . Financial resource strain: Somewhat hard  . Food insecurity:    Worry: Never true    Inability: Never true  . Transportation needs:    Medical: No    Non-medical: No  Tobacco Use  . Smoking status: Never Smoker  . Smokeless tobacco: Never Used  Substance and Sexual Activity  . Alcohol use: No  . Drug use: No  . Sexual activity: Not Currently  Lifestyle  . Physical activity:    Days per week: 0 days    Minutes per session: 0 min  . Stress: Not at all  Relationships  . Social connections:    Talks on phone: More than three times a week    Gets together: Twice a week    Attends religious service: Never    Active member of club or organization: No    Attends meetings of clubs or organizations: Never    Relationship status: Married  . Intimate partner violence:    Fear of current or ex partner: No    Emotionally abused: No    Physically abused: No    Forced sexual activity: No  Other Topics Concern  . Not on file  Social History Narrative  . Not on file   The PMH, PSH, Social History, Family History, Medications, and allergies have been reviewed in [facility], and have been updated if relevant.   Review of Systems  Constitutional: Positive for fatigue. Negative for fever.  HENT: Positive for congestion, postnasal drip, rhinorrhea, sinus pressure and sinus pain. Negative for dental problem, drooling, ear discharge, ear pain, facial swelling, hearing loss, mouth sores, nosebleeds, sneezing, sore throat, tinnitus, trouble swallowing and voice change.   Respiratory: Positive for cough and wheezing. Negative for apnea, choking, chest tightness, shortness of breath and stridor.   Cardiovascular: Positive  for leg swelling. Negative for chest pain.  Musculoskeletal: Negative.   Neurological: Negative.   Psychiatric/Behavioral: Negative.   All other systems reviewed and are negative.      Objective:    BP (!) 166/66 (BP Location: Right Arm, Cuff Size: Normal)   Pulse 73   Temp 99 F (37.2 C) (Oral)   Ht 5' 3.25" (1.607 m)   Wt 160 lb 12.8 oz (72.9 kg)   SpO2 95%   BMI 28.26  kg/m    Physical Exam  Constitutional: She is oriented to person, place, and time. She appears well-developed and well-nourished. No distress.  HENT:  Head: Normocephalic and atraumatic.  Right Ear: Hearing and tympanic membrane normal.  Left Ear: Hearing and tympanic membrane normal.  Nose: Mucosal edema present. Right sinus exhibits maxillary sinus tenderness. Left sinus exhibits maxillary sinus tenderness.  Mouth/Throat: Uvula is midline. Posterior oropharyngeal erythema present. No oropharyngeal exudate, posterior oropharyngeal edema or tonsillar abscesses.  Eyes: EOM are normal.  Neck: Normal range of motion.  Cardiovascular: Normal rate and regular rhythm.  Pulmonary/Chest: Effort normal and breath sounds normal.  Musculoskeletal: She exhibits edema.  Neurological: She is alert and oriented to person, place, and time. No cranial nerve deficit.  Skin: Skin is warm and dry. She is not diaphoretic.  Psychiatric: She has a normal mood and affect. Her behavior is normal. Judgment and thought content normal.  Nursing note and vitals reviewed.         Assessment & Plan:   Essential hypertension  Bilateral leg edema  Acute maxillary sinusitis, recurrence not specified Return in about 2 weeks (around 04/27/2018) for blood pressure recheck.Marland Kitchen

## 2018-04-13 NOTE — Assessment & Plan Note (Signed)
Deteriorated- ? Due to acute infection.  Advised her to make sure she knows the allegra she has at home does not contain sudafed. She says it is not helping much so she will stop taking it. Follow up with me in 2 weeks. The patient indicates understanding of these issues and agrees with the plan.

## 2018-04-13 NOTE — Assessment & Plan Note (Signed)
Awaiting renal referral.

## 2018-04-13 NOTE — Patient Instructions (Signed)
Great to see you.  Take zpack as directed.  Make sure you're not taking anything with a decongestant like sudafed (D).  Coricidin HBP is safe for high blood pressure and renal disease.

## 2018-04-13 NOTE — Assessment & Plan Note (Signed)
New- Given duration and progression of symptoms, will treat for bacterial sinusitis with zpack (safe for renal impairment and will also cover respiratory pathogens). Advised OTC coricidin HBP. Follow up in 2 weeks when I see her for BP follow up.

## 2018-04-13 NOTE — Assessment & Plan Note (Signed)
Trace edema today- likely multifactorial but I did tell patient and her daughter that norvasc can worsen edema.  Advised to get compression socks that aren't as strong- even compression knee high running socks would be better than nothing.  Keep legs elevated. Follow up in 2 weeks when she comes in for BP recheck. The patient indicates understanding of these issues and agrees with the plan.

## 2018-04-14 ENCOUNTER — Encounter: Payer: Self-pay | Admitting: Family Medicine

## 2018-04-16 ENCOUNTER — Ambulatory Visit: Payer: Medicare Other | Admitting: Family Medicine

## 2018-04-27 ENCOUNTER — Encounter: Payer: Self-pay | Admitting: Family Medicine

## 2018-04-28 ENCOUNTER — Ambulatory Visit: Payer: Medicare Other | Admitting: Family Medicine

## 2018-04-28 ENCOUNTER — Other Ambulatory Visit: Payer: Self-pay | Admitting: Family Medicine

## 2018-04-28 MED ORDER — AZITHROMYCIN 250 MG PO TABS: ORAL_TABLET | ORAL | 0 refills | Status: DC

## 2018-05-06 ENCOUNTER — Ambulatory Visit: Payer: Medicare Other | Admitting: Family Medicine

## 2018-05-06 ENCOUNTER — Encounter: Payer: Self-pay | Admitting: Family Medicine

## 2018-05-06 VITALS — BP 136/74 | HR 74 | Temp 98.1°F | Ht 63.5 in | Wt 159.0 lb

## 2018-05-06 DIAGNOSIS — I10 Essential (primary) hypertension: Secondary | ICD-10-CM

## 2018-05-06 DIAGNOSIS — T7840XA Allergy, unspecified, initial encounter: Secondary | ICD-10-CM

## 2018-05-06 DIAGNOSIS — N183 Chronic kidney disease, stage 3 unspecified: Secondary | ICD-10-CM

## 2018-05-06 MED ORDER — HYDRALAZINE HCL 10 MG PO TABS: 10.0000 mg | ORAL_TABLET | Freq: Three times a day (TID) | ORAL | 3 refills | Status: DC

## 2018-05-06 NOTE — Progress Notes (Signed)
 Subjective:   Patient ID: [name], female    DOB: 02/19/1946, 72 y.o.   MRN: [id number]  [name] is a pleasant 72 y.o. year old female who presents to clinic today with Other (Wants to discuss discontining Norvasc due to edema. She has been experiencing increased edema in her hands, legs and tongue ongoing for two weeks. PT was stopped two weeks ago due to insurance benefits-which she thinks it helped a lot. ) and Shoulder Pain (Ongoing for two weeks-located in the posterior aspect. Admits to difficulty lifiting her shoulder.)  on 05/06/2018  HPI:  Here with her daughter, Leila.  Unfortunately, rather than following our medical advise, she waited to come see me today rather than going to the ER. I advised that she go to the ER as she was saying the swelling wasn't just in her hands and legs but also her tongue for the past two weeks.  She has multiple allergies to antihypertensives- including lisinopril (cough), clonidine (anaphylaxis) and now norvasc.  CKD III - still has not been contacted by [facility] for an appointment.   Current Outpatient Medications on File Prior to Visit  Medication Sig Dispense Refill  . Aflibercept (EYLEA) 2 MG/0.05ML SOLN 1 each by Intravitreal route every 3 (three) months.     . ARNICA EX Take 1 tablet by mouth daily as needed (For pain.).     . blood glucose meter kit and supplies KIT Dispense based on patient and insurance preference. UAD bid for glucose monitoring Dx: E11.9 1 each 0  . Blood Pressure Monitoring (BLOOD PRESSURE MONITOR AUTOMAT) DEVI UAD for BP monitoring; Dx: I10 & I16.0 1 Device 0  . diclofenac sodium (VOLTAREN) 1 % GEL Apply 2 g topically 4 (four) times daily. (Patient taking differently: Apply 2 g topically 4 (four) times daily as needed (For pain.). ) 100 g 0  . fexofenadine (ALLEGRA) 180 MG tablet Take 180 mg by mouth daily.    . fluticasone (FLONASE) 50 MCG/ACT nasal spray Place 2 sprays into both nostrils daily.    .  glucose blood test strip UAD bid for glucose monitoring Dx: E11.9 100 each 12  . Lancet Device MISC UAD bid for glucose monitoring Dx: E11.9 1 each prn  . sertraline (ZOLOFT) 50 MG tablet Take 50 mg by mouth daily.    . TURMERIC PO Take 1 tablet by mouth daily.     No current facility-administered medications on file prior to visit.     Allergies  Allergen Reactions  . Clonidine Anaphylaxis, Swelling and Other (See Comments)    Tongue swelling  . Norvasc [Amlodipine Besylate] Swelling    Edema and TONGUE swelling   . Lisinopril Cough  . Amlodipine Anxiety, Nausea Only and Swelling  . Codeine Itching and Rash    Past Medical History:  Diagnosis Date  . Allergy   . Arthritis   . Cataract   . Diabetes mellitus without complication (HCC)   . Hypertension   . Macular degeneration syndrome    with edema---being treated.     Past Surgical History:  Procedure Laterality Date  . callous removal  Left 2017   located on 1st digit on L foot 2/2 diabetes  . EYE SURGERY      Family History  Problem Relation Age of Onset  . Kidney disease Mother   . Congestive Heart Failure Father   . COPD Father   . COPD Brother   . Diabetes Maternal Aunt       Social History   Socioeconomic History  . Marital status: Married    Spouse name: Rennis Harding  . Number of children: 2  . 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 Highest education level: High school graduate  Occupational History  . Not on file  Social Needs  . Financial resource strain: Somewhat hard  . Food insecurity:    Worry: Never true    Inability: Never true  . Transportation needs:    Medical: No    Non-medical: No  Tobacco Use  . Smoking status: Never Smoker  . Smokeless tobacco: Never Used  Substance and Sexual Activity  . Alcohol use: No  . Drug use: No  . Sexual activity: Not Currently  Lifestyle  . Physical activity:    Days per week: 0 days    Minutes per session: 0 min    . Stress: Not at all  Relationships  . Social connections:    Talks on phone: More than three times a week    Gets together: Twice a week    Attends religious service: Never    Active member of club or organization: No    Attends meetings of clubs or organizations: Never    Relationship status: Married  . Intimate partner violence:    Fear of current or ex partner: No    Emotionally abused: No    Physically abused: No    Forced sexual activity: No  Other Topics Concern  . Not on file  Social History Narrative  . Not on file   The PMH, PSH, Social History, Family History, Medications, and allergies have been reviewed in [facility], and have been updated if relevant.   Review of Systems  Constitutional: Negative.   HENT: Positive for trouble swallowing. Negative for mouth sores, nosebleeds, postnasal drip, rhinorrhea, sinus pressure, sneezing and sore throat.   Respiratory: Negative.   Cardiovascular: Positive for leg swelling. Negative for chest pain and palpitations.  Musculoskeletal: Negative.   Neurological: Negative.   Hematological: Negative.   Psychiatric/Behavioral: Negative.   All other systems reviewed and are negative.      Objective:    BP 136/74   Pulse 74   Temp 98.1 F (36.7 C) (Oral)   Ht 5' 3.5" (1.613 m)   Wt 159 lb (73 kg)   SpO2 97%   BMI 27.72 kg/m    Physical Exam  Constitutional: She is oriented to person, place, and time. She appears well-developed and well-nourished. No distress.  HENT:  Head: Normocephalic and atraumatic.  Eyes: EOM are normal.  Neck: Normal range of motion.  Cardiovascular: Normal rate.  Pulmonary/Chest: Effort normal.  Musculoskeletal: She exhibits edema.  Neurological: She is alert and oriented to person, place, and time. No cranial nerve deficit.  Skin: Skin is warm and dry. She is not diaphoretic.  Psychiatric: She has a normal mood and affect. Her behavior is normal. Judgment and thought content normal.  Nursing  note and vitals reviewed.         Assessment & Plan:   Essential hypertension  Allergic reaction to drug, initial encounter  CKD (chronic kidney disease), stage III ([place]) - Plan: Ambulatory referral to Nephrology Return in about 2 weeks (around 05/20/2018) for blood pressure recheck.Marland Kitchen

## 2018-05-06 NOTE — Assessment & Plan Note (Signed)
Controlled on norvasc but having tongue swelling. >25 minutes spent in face to face time with patient, >50% spent in counselling or coordination of care.  Advised to STOP norvasc ASAP.  She has multiple drug allergies. eRx sent for hydralazine 10 mg three times daily.  She will check BP at home twice daily and follow up with me in 11/18. Check renal function panel at that time.

## 2018-05-06 NOTE — Patient Instructions (Signed)
Great to see you.  Please STOP taking Norvasc.  Start taking Hydralazine 10 mg three times daily.  Follow up with me in 2 weeks.

## 2018-05-07 ENCOUNTER — Encounter: Payer: Self-pay | Admitting: Family Medicine

## 2018-05-25 ENCOUNTER — Ambulatory Visit: Payer: Medicare Other | Admitting: Family Medicine

## 2018-05-25 ENCOUNTER — Encounter: Payer: Self-pay | Admitting: Family Medicine

## 2018-05-25 VITALS — BP 156/84 | HR 68 | Temp 97.5°F | Ht 63.5 in | Wt 155.2 lb

## 2018-05-25 DIAGNOSIS — I10 Essential (primary) hypertension: Secondary | ICD-10-CM

## 2018-05-25 DIAGNOSIS — E559 Vitamin D deficiency, unspecified: Secondary | ICD-10-CM | POA: Diagnosis not present

## 2018-05-25 DIAGNOSIS — N183 Chronic kidney disease, stage 3 unspecified: Secondary | ICD-10-CM

## 2018-05-25 DIAGNOSIS — E119 Type 2 diabetes mellitus without complications: Secondary | ICD-10-CM | POA: Diagnosis not present

## 2018-05-25 LAB — COMPREHENSIVE METABOLIC PANEL
ALT: 12 U/L (ref 0–35)
AST: 13 U/L (ref 0–37)
Albumin: 4.2 g/dL (ref 3.5–5.2)
Alkaline Phosphatase: 102 U/L (ref 39–117)
BUN: 29 mg/dL — AB (ref 6–23)
CO2: 24 mEq/L (ref 19–32)
Calcium: 9.7 mg/dL (ref 8.4–10.5)
Chloride: 104 mEq/L (ref 96–112)
Creatinine, Ser: 1.64 mg/dL — ABNORMAL HIGH (ref 0.40–1.20)
GFR: 32.64 mL/min — ABNORMAL LOW (ref >60.00)
GLUCOSE: 112 mg/dL — AB (ref 70–99)
POTASSIUM: 4.6 meq/L (ref 3.5–5.1)
SODIUM: 139 meq/L (ref 135–145)
TOTAL PROTEIN: 7.3 g/dL (ref 6.0–8.3)
Total Bilirubin: 0.3 mg/dL (ref 0.2–1.2)

## 2018-05-25 LAB — HEMOGLOBIN A1C: HEMOGLOBIN A1C: 6.5 % (ref 4.6–6.5)

## 2018-05-25 LAB — VITAMIN D 25 HYDROXY (VIT D DEFICIENCY, FRACTURES): VITD: 31.33 ng/mL (ref 30.00–100.00)

## 2018-05-25 MED ORDER — HYDRALAZINE HCL 25 MG PO TABS: 25.0000 mg | ORAL_TABLET | Freq: Three times a day (TID) | ORAL | 3 refills | Status: DC

## 2018-05-25 NOTE — Assessment & Plan Note (Signed)
Recheck renal function today, inquire about the status of her referral.

## 2018-05-25 NOTE — Assessment & Plan Note (Signed)
Not quite at goal but tolerating hydralazine well.  Increase dose of hydralazine to 25 mg three times daily.  Keep checking BP at home.  Follow up with me in 3 weeks, sooner if there are any concerns. The patient indicates understanding of these issues and agrees with the plan.

## 2018-05-25 NOTE — Patient Instructions (Addendum)
Great to see you. I will call you with your lab results from today and you can view them online.   We are increasing your Hydralazine to 25 mg three times daily.  Please come see me in 1 month.

## 2018-05-25 NOTE — Assessment & Plan Note (Signed)
Has been diet controlled. Due for a1c today.

## 2018-05-25 NOTE — Progress Notes (Signed)
Subjective:   Patient ID: [name], female    DOB: 12-26-1945, 72 y.o.   MRN: [id number]  [name] is a pleasant 72 y.o. year old female who presents to clinic today with Follow-up (Patient is here today to F/U with Hypertension and Edema.  At 10.30.19 OV pt was referred to Nephrology, Norvasc was D/C'ed due to allergy, and started Hydralazine 49m tid.  Advised to check BP bid.  The Hydralazine was increased to qid and BP is running 140-160. Would like to see about increase in mg.)  on 05/25/2018  HPI: Here with her daughter [name] follow up HTN.  When I last saw her on 05/06/18, we referred her again to nephrology, dc'd norvasc due to tongue swelling and started her on hydralazine 10 mg three times daily. Since then her BP has been running in the high [id number]. She actually feel much better now that her legs are no longer swollen.  Has also had no further episodes of tongue swelling.   DM- has been diet controlled.  Metformin stopped due to worsening renal function. Her daughters have made sure that she is sticking to her diet.   Lab Results  Component Value Date   HGBA1C 7.4 (H) 02/26/2018   CKD III- Referred her again to renal but pt has still not heard about an appointment.  Current Outpatient Medications on File Prior to Visit  Medication Sig Dispense Refill  . Aflibercept (EYLEA) 2 MG/0.05ML SOLN 1 each by Intravitreal route every 3 (three) months.     .Rodman KeyEX Take 1 tablet by mouth daily as needed (For pain.).     .Marland Kitchenblood glucose meter kit and supplies KIT Dispense based on patient and insurance preference. UAD bid for glucose monitoring Dx: E11.9 1 each 0  . Blood Pressure Monitoring (BLOOD PRESSURE MONITOR AUTOMAT) DEVI UAD for BP monitoring; Dx: I10 & I16.0 1 Device 0  . diclofenac sodium (VOLTAREN) 1 % GEL Apply 2 g topically 4 (four) times daily. (Patient taking differently: Apply 2 g topically 4 (four) times daily as needed (For pain.). )  100 g 0  . fexofenadine (ALLEGRA) 180 MG tablet Take 180 mg by mouth daily.    . fluticasone (FLONASE) 50 MCG/ACT nasal spray Place 2 sprays into both nostrils daily.    .Marland Kitchenglucose blood test strip UAD bid for glucose monitoring Dx: E11.9 100 each 12  . Lancet Device MISC UAD bid for glucose monitoring Dx: E11.9 1 each prn  . sertraline (ZOLOFT) 50 MG tablet Take 50 mg by mouth daily.    . TURMERIC PO Take 1 tablet by mouth daily.     No current facility-administered medications on file prior to visit.     Allergies  Allergen Reactions  . Clonidine Anaphylaxis, Swelling and Other (See Comments)    Tongue swelling  . Norvasc [Amlodipine Besylate] Swelling    Edema and TONGUE swelling   . Lisinopril Cough  . Amlodipine Anxiety, Nausea Only and Swelling  . Codeine Itching and Rash    Past Medical History:  Diagnosis Date  . Allergy   . Arthritis   . Cataract   . Diabetes [place] complication [place]   . Hypertension   . Macular degeneration syndrome    with edema---being treated.     Past Surgical History:  Procedure Laterality Date  . callous removal  Left 2017   located on 1st digit on L foot 2/2 diabetes  . EYE SURGERY  Family History  Problem Relation Age of Onset  . Kidney disease Mother   . Congestive Heart Failure Father   . COPD Father   . COPD Brother   . Diabetes Maternal Aunt     Social History   Socioeconomic History  . Marital status: Married    Spouse name: [name]  . Number of children: 2  . 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 Highest education level: High school graduate  Occupational History  . Not on file  Social Needs  . Financial resource strain: Somewhat hard  . Food insecurity:    Worry: Never true    Inability: Never true  . Transportation needs:    Medical: No    Non-medical: No  Tobacco Use  . Smoking status: Never Smoker  . Smokeless tobacco: Never Used  Substance and Sexual  Activity  . Alcohol use: No  . Drug use: No  . Sexual activity: Not Currently  Lifestyle  . Physical activity:    Days per week: 0 days    Minutes per session: 0 min  . Stress: Not at all  Relationships  . Social connections:    Talks on phone: More than three times a week    Gets together: Twice a week    Attends religious service: Never    Active member of club or organization: No    Attends meetings of clubs or organizations: Never    Relationship status: Married  . Intimate partner violence:    Fear of current or ex partner: No    Emotionally abused: No    Physically abused: No    Forced sexual activity: No  Other Topics Concern  . Not on file  Social History Narrative  . Not on file   The PMH, PSH, Social History, Family History, Medications, and allergies have been reviewed in [facility], and have been updated if relevant.  Review of Systems  Constitutional: Negative.   HENT: Negative.   Respiratory: Negative.   Cardiovascular: Negative.   Gastrointestinal: Negative.   Endocrine: Negative.   Genitourinary: Negative.   Musculoskeletal: Negative.   Skin: Negative.   Allergic/Immunologic: Negative.   Neurological: Negative.   Hematological: Negative.   Psychiatric/Behavioral: Negative.   All other systems reviewed and are negative.      Objective:    BP (!) 156/84 (BP Location: Right Arm, Cuff Size: Normal)   Pulse 68   Temp (!) 97.5 F (36.4 C) (Oral)   Ht 5' 3.5" (1.613 m)   Wt 155 lb 3.2 oz (70.4 kg)   SpO2 96%   BMI 27.06 kg/m    Physical Exam  Constitutional: She is oriented to person, place, and time. She appears well-developed and well-nourished. No distress.  HENT:  Head: Normocephalic and atraumatic.  Eyes: EOM are normal.  Neck: Normal range of motion.  Neurological: She is alert and oriented to person, place, and time.  Much better movement of left arm.  Skin: Skin is warm and dry. She is not diaphoretic.  Psychiatric: She has a normal mood  and affect. Her behavior is normal. Judgment and thought content normal.  Nursing note and vitals reviewed.         Assessment & Plan:   Chronic kidney disease (CKD), stage III (moderate) (HCC) - Plan: Comprehensive metabolic panel  Essential hypertension - Plan: Comprehensive metabolic panel  Diabetes mellitus type 2 in nonobese ([place]) - Plan: Hemoglobin A1c  Vitamin D deficiency -  Plan: Vitamin D (25 hydroxy) Return in about 1 month (around 06/24/2018).

## 2018-05-26 ENCOUNTER — Encounter: Payer: Self-pay | Admitting: Family Medicine

## 2018-05-29 ENCOUNTER — Encounter: Payer: Self-pay | Admitting: Family Medicine

## 2018-06-07 ENCOUNTER — Encounter: Payer: Self-pay | Admitting: Family Medicine

## 2018-06-08 ENCOUNTER — Telehealth: Payer: Self-pay | Admitting: Family Medicine

## 2018-06-08 ENCOUNTER — Encounter: Payer: Self-pay | Admitting: Family Medicine

## 2018-06-08 NOTE — Telephone Encounter (Signed)
Copied from [id number]. Topic: Quick Communication - See Telephone Encounter >> Jun 08, 2018 10:55 AM Charlynn Court wrote: CRM for notification. See Telephone encounter for: 06/08/18. Pt daughter called wanting someone to call her. She wants something for her mom's nerves

## 2018-06-09 ENCOUNTER — Other Ambulatory Visit: Payer: Self-pay

## 2018-06-09 MED ORDER — HYDRALAZINE HCL 25 MG PO TABS: 25.0000 mg | ORAL_TABLET | Freq: Four times a day (QID) | ORAL | 3 refills | Status: DC

## 2018-06-17 ENCOUNTER — Encounter: Payer: Self-pay | Admitting: Family Medicine

## 2018-06-17 LAB — BASIC METABOLIC PANEL
BUN: 27 — AB (ref 4–21)
CREATININE: 1.6 — AB (ref 0.5–1.1)
Glucose: 163
Potassium: 4.2 (ref 3.4–5.3)
SODIUM: 140 (ref 137–147)

## 2018-06-17 LAB — MICROALBUMIN, URINE: Microalb, Ur: 2867

## 2018-06-23 ENCOUNTER — Other Ambulatory Visit: Payer: Self-pay | Admitting: Nephrology

## 2018-06-23 DIAGNOSIS — I129 Hypertensive chronic kidney disease with stage 1 through stage 4 chronic kidney disease, or unspecified chronic kidney disease: Secondary | ICD-10-CM

## 2018-06-23 DIAGNOSIS — N183 Chronic kidney disease, stage 3 unspecified: Secondary | ICD-10-CM

## 2018-06-24 ENCOUNTER — Encounter: Payer: Self-pay | Admitting: Family Medicine

## 2018-07-15 ENCOUNTER — Ambulatory Visit
Admission: RE | Admit: 2018-07-15 | Discharge: 2018-07-15 | Disposition: A | Discharge status: Home / Self Care | Payer: Medicare Other | Source: Ambulatory Visit | Attending: Nephrology | Admitting: Nephrology

## 2018-07-15 DIAGNOSIS — N183 Chronic kidney disease, stage 3 unspecified: Secondary | ICD-10-CM

## 2018-07-15 DIAGNOSIS — I129 Hypertensive chronic kidney disease with stage 1 through stage 4 chronic kidney disease, or unspecified chronic kidney disease: Secondary | ICD-10-CM

## 2018-07-22 ENCOUNTER — Encounter: Payer: Self-pay | Admitting: Family Medicine

## 2018-07-22 NOTE — Progress Notes (Signed)
[facility]/thx dmf

## 2018-09-10 ENCOUNTER — Ambulatory Visit: Payer: Medicare Other | Admitting: Family Medicine

## 2018-09-14 ENCOUNTER — Encounter: Payer: Self-pay | Admitting: Family Medicine

## 2018-09-14 ENCOUNTER — Other Ambulatory Visit: Payer: Self-pay | Admitting: Family Medicine

## 2018-09-14 MED ORDER — AZITHROMYCIN 250 MG PO TABS: ORAL_TABLET | ORAL | 0 refills | Status: DC

## 2018-09-21 ENCOUNTER — Other Ambulatory Visit: Payer: Self-pay

## 2018-09-21 ENCOUNTER — Encounter (HOSPITAL_COMMUNITY): Payer: Self-pay

## 2018-09-21 ENCOUNTER — Emergency Department (HOSPITAL_COMMUNITY): Payer: Medicare Other

## 2018-09-21 ENCOUNTER — Inpatient Hospital Stay (HOSPITAL_COMMUNITY)
Admission: EM | Admit: 2018-09-21 | Discharge: 2018-09-27 | DRG: 536 | Disposition: A | Discharge status: Home / Self Care | Payer: Medicare Other | Attending: Internal Medicine | Admitting: Internal Medicine

## 2018-09-21 DIAGNOSIS — J40 Bronchitis, not specified as acute or chronic: Secondary | ICD-10-CM | POA: Diagnosis present

## 2018-09-21 DIAGNOSIS — R509 Fever, unspecified: Secondary | ICD-10-CM

## 2018-09-21 DIAGNOSIS — E119 Type 2 diabetes mellitus without complications: Secondary | ICD-10-CM

## 2018-09-21 DIAGNOSIS — I160 Hypertensive urgency: Secondary | ICD-10-CM | POA: Diagnosis not present

## 2018-09-21 DIAGNOSIS — E13311 Other specified diabetes mellitus with unspecified diabetic retinopathy with macular edema: Secondary | ICD-10-CM | POA: Diagnosis present

## 2018-09-21 DIAGNOSIS — E1122 Type 2 diabetes mellitus with diabetic chronic kidney disease: Secondary | ICD-10-CM | POA: Diagnosis present

## 2018-09-21 DIAGNOSIS — R52 Pain, unspecified: Secondary | ICD-10-CM

## 2018-09-21 DIAGNOSIS — E11311 Type 2 diabetes mellitus with unspecified diabetic retinopathy with macular edema: Secondary | ICD-10-CM | POA: Diagnosis present

## 2018-09-21 DIAGNOSIS — I69354 Hemiplegia and hemiparesis following cerebral infarction affecting left non-dominant side: Secondary | ICD-10-CM

## 2018-09-21 DIAGNOSIS — W010XXA Fall on same level from slipping, tripping and stumbling without subsequent striking against object, initial encounter: Secondary | ICD-10-CM | POA: Diagnosis present

## 2018-09-21 DIAGNOSIS — W19XXXA Unspecified fall, initial encounter: Secondary | ICD-10-CM

## 2018-09-21 DIAGNOSIS — Z79899 Other long term (current) drug therapy: Secondary | ICD-10-CM

## 2018-09-21 DIAGNOSIS — E663 Overweight: Secondary | ICD-10-CM | POA: Diagnosis present

## 2018-09-21 DIAGNOSIS — Z825 Family history of asthma and other chronic lower respiratory diseases: Secondary | ICD-10-CM

## 2018-09-21 DIAGNOSIS — Z833 Family history of diabetes mellitus: Secondary | ICD-10-CM

## 2018-09-21 DIAGNOSIS — I129 Hypertensive chronic kidney disease with stage 1 through stage 4 chronic kidney disease, or unspecified chronic kidney disease: Secondary | ICD-10-CM | POA: Diagnosis present

## 2018-09-21 DIAGNOSIS — Z7982 Long term (current) use of aspirin: Secondary | ICD-10-CM

## 2018-09-21 DIAGNOSIS — Z7951 Long term (current) use of inhaled steroids: Secondary | ICD-10-CM

## 2018-09-21 DIAGNOSIS — N183 Chronic kidney disease, stage 3 unspecified: Secondary | ICD-10-CM | POA: Diagnosis present

## 2018-09-21 DIAGNOSIS — Z841 Family history of disorders of kidney and ureter: Secondary | ICD-10-CM

## 2018-09-21 DIAGNOSIS — T40605A Adverse effect of unspecified narcotics, initial encounter: Secondary | ICD-10-CM | POA: Diagnosis not present

## 2018-09-21 DIAGNOSIS — M25552 Pain in left hip: Secondary | ICD-10-CM | POA: Diagnosis not present

## 2018-09-21 DIAGNOSIS — L8990 Pressure ulcer of unspecified site, unspecified stage: Secondary | ICD-10-CM

## 2018-09-21 DIAGNOSIS — K5903 Drug induced constipation: Secondary | ICD-10-CM | POA: Diagnosis not present

## 2018-09-21 DIAGNOSIS — Z794 Long term (current) use of insulin: Secondary | ICD-10-CM

## 2018-09-21 DIAGNOSIS — S3282XA Multiple fractures of pelvis without disruption of pelvic ring, initial encounter for closed fracture: Secondary | ICD-10-CM

## 2018-09-21 DIAGNOSIS — Z885 Allergy status to narcotic agent status: Secondary | ICD-10-CM

## 2018-09-21 DIAGNOSIS — Z8249 Family history of ischemic heart disease and other diseases of the circulatory system: Secondary | ICD-10-CM

## 2018-09-21 DIAGNOSIS — S329XXA Fracture of unspecified parts of lumbosacral spine and pelvis, initial encounter for closed fracture: Secondary | ICD-10-CM | POA: Diagnosis present

## 2018-09-21 DIAGNOSIS — L89611 Pressure ulcer of right heel, stage 1: Secondary | ICD-10-CM | POA: Diagnosis not present

## 2018-09-21 DIAGNOSIS — R21 Rash and other nonspecific skin eruption: Secondary | ICD-10-CM | POA: Diagnosis not present

## 2018-09-21 DIAGNOSIS — L89621 Pressure ulcer of left heel, stage 1: Secondary | ICD-10-CM | POA: Diagnosis not present

## 2018-09-21 DIAGNOSIS — Z888 Allergy status to other drugs, medicaments and biological substances status: Secondary | ICD-10-CM

## 2018-09-21 DIAGNOSIS — S32110A Nondisplaced Zone I fracture of sacrum, initial encounter for closed fracture: Secondary | ICD-10-CM | POA: Diagnosis present

## 2018-09-21 LAB — CBC WITH DIFFERENTIAL/PLATELET
Abs Immature Granulocytes: 0.03 10*3/uL (ref 0.00–0.07)
BASOS PCT: 1 %
Basophils Absolute: 0.1 10*3/uL (ref 0.0–0.1)
EOS PCT: 5 %
Eosinophils Absolute: 0.5 10*3/uL (ref 0.0–0.5)
HCT: 37.8 % (ref 36.0–46.0)
Hemoglobin: 12.4 g/dL (ref 12.0–15.0)
Immature Granulocytes: 0 %
Lymphocytes Relative: 13 %
Lymphs Abs: 1.4 10*3/uL (ref 0.7–4.0)
MCH: 27.9 pg (ref 26.0–34.0)
MCHC: 32.8 g/dL (ref 30.0–36.0)
MCV: 84.9 fL (ref 80.0–100.0)
Monocytes Absolute: 0.9 10*3/uL (ref 0.1–1.0)
Monocytes Relative: 9 %
NRBC: 0 % (ref 0.0–0.2)
Neutro Abs: 7.3 10*3/uL (ref 1.7–7.7)
Neutrophils Relative %: 72 %
PLATELETS: 267 10*3/uL (ref 150–400)
RBC: 4.45 MIL/uL (ref 3.87–5.11)
RDW: 13.4 % (ref 11.5–15.5)
WBC: 10.3 10*3/uL (ref 4.0–10.5)

## 2018-09-21 LAB — BASIC METABOLIC PANEL
Anion gap: 9 (ref 5–15)
BUN: 21 mg/dL (ref 8–23)
CO2: 24 mmol/L (ref 22–32)
Calcium: 9 mg/dL (ref 8.9–10.3)
Chloride: 107 mmol/L (ref 98–111)
Creatinine, Ser: 1.58 mg/dL — ABNORMAL HIGH (ref 0.44–1.00)
GFR calc Af Amer: 37 mL/min — ABNORMAL LOW (ref >60)
GFR, EST NON AFRICAN AMERICAN: 32 mL/min — AB (ref >60)
Glucose, Bld: 118 mg/dL — ABNORMAL HIGH (ref 70–99)
Potassium: 3.8 mmol/L (ref 3.5–5.1)
Sodium: 140 mmol/L (ref 135–145)

## 2018-09-21 LAB — GLUCOSE, CAPILLARY
Glucose-Capillary: 92 mg/dL (ref 70–99)
Glucose-Capillary: 114 mg/dL — ABNORMAL HIGH (ref 70–99)

## 2018-09-21 MED ORDER — ENOXAPARIN SODIUM 30 MG/0.3ML ~~LOC~~ SOLN
30.0000 mg | SUBCUTANEOUS | Status: DC
  Administered 2018-09-21 – 2018-09-26 (×6): 30 mg via SUBCUTANEOUS
  Filled 2018-09-21 (×6): qty 0.3

## 2018-09-21 MED ORDER — INSULIN ASPART 100 UNIT/ML ~~LOC~~ SOLN
0.0000 [IU] | Freq: Three times a day (TID) | SUBCUTANEOUS | Status: DC
  Administered 2018-09-24 – 2018-09-26 (×4): 1 [IU] via SUBCUTANEOUS

## 2018-09-21 MED ORDER — FENTANYL CITRATE (PF) 100 MCG/2ML IJ SOLN
25.0000 ug | Freq: Once | INTRAMUSCULAR | Status: AC
  Administered 2018-09-21: 25 ug via INTRAMUSCULAR
  Filled 2018-09-21: qty 2

## 2018-09-21 MED ORDER — LOSARTAN POTASSIUM 25 MG PO TABS
25.0000 mg | ORAL_TABLET | Freq: Every day | ORAL | Status: DC
  Administered 2018-09-21 – 2018-09-27 (×7): 25 mg via ORAL
  Filled 2018-09-21 (×7): qty 1

## 2018-09-21 MED ORDER — ASPIRIN EC 81 MG PO TBEC
81.0000 mg | DELAYED_RELEASE_TABLET | Freq: Every day | ORAL | Status: DC
  Administered 2018-09-21 – 2018-09-27 (×7): 81 mg via ORAL
  Filled 2018-09-21 (×7): qty 1

## 2018-09-21 MED ORDER — ACETAMINOPHEN 650 MG RE SUPP: 650.0000 mg | Freq: Four times a day (QID) | RECTAL | Status: DC | PRN

## 2018-09-21 MED ORDER — ONDANSETRON HCL 4 MG PO TABS: 4.0000 mg | ORAL_TABLET | Freq: Four times a day (QID) | ORAL | Status: DC | PRN

## 2018-09-21 MED ORDER — MORPHINE SULFATE (PF) 4 MG/ML IV SOLN
4.0000 mg | Freq: Once | INTRAVENOUS | Status: AC
  Administered 2018-09-21: 4 mg via INTRAVENOUS
  Filled 2018-09-21: qty 1

## 2018-09-21 MED ORDER — HYDROMORPHONE HCL 1 MG/ML IJ SOLN: 0.5000 mg | INTRAMUSCULAR | Status: DC | PRN

## 2018-09-21 MED ORDER — HYDRALAZINE HCL 25 MG PO TABS
25.0000 mg | ORAL_TABLET | Freq: Once | ORAL | Status: AC
  Administered 2018-09-21: 25 mg via ORAL
  Filled 2018-09-21: qty 1

## 2018-09-21 MED ORDER — INSULIN ASPART 100 UNIT/ML ~~LOC~~ SOLN: 0.0000 [IU] | Freq: Every day | SUBCUTANEOUS | Status: DC

## 2018-09-21 MED ORDER — MORPHINE SULFATE (PF) 2 MG/ML IV SOLN
2.0000 mg | INTRAVENOUS | Status: DC | PRN
  Administered 2018-09-21 – 2018-09-24 (×10): 2 mg via INTRAVENOUS
  Filled 2018-09-21 (×10): qty 1

## 2018-09-21 MED ORDER — HYDRALAZINE HCL 20 MG/ML IJ SOLN: 5.0000 mg | INTRAMUSCULAR | Status: DC

## 2018-09-21 MED ORDER — ONDANSETRON HCL 4 MG/2ML IJ SOLN: 4.0000 mg | Freq: Four times a day (QID) | INTRAMUSCULAR | Status: DC | PRN

## 2018-09-21 MED ORDER — ACETAMINOPHEN 325 MG PO TABS
650.0000 mg | ORAL_TABLET | Freq: Four times a day (QID) | ORAL | Status: DC | PRN
  Administered 2018-09-23 – 2018-09-27 (×10): 650 mg via ORAL
  Filled 2018-09-21 (×10): qty 2

## 2018-09-21 MED ORDER — SODIUM CHLORIDE 0.9 % IV SOLN
INTRAVENOUS | Status: DC
  Administered 2018-09-21 – 2018-09-22 (×4): via INTRAVENOUS

## 2018-09-21 MED ORDER — ACETAMINOPHEN 500 MG PO TABS
1000.0000 mg | ORAL_TABLET | Freq: Four times a day (QID) | ORAL | Status: DC | PRN
  Administered 2018-09-21 – 2018-09-22 (×3): 1000 mg via ORAL
  Filled 2018-09-21 (×3): qty 2

## 2018-09-21 MED ORDER — LORATADINE 10 MG PO TABS
10.0000 mg | ORAL_TABLET | Freq: Every day | ORAL | Status: DC
  Administered 2018-09-21 – 2018-09-26 (×5): 10 mg via ORAL
  Filled 2018-09-21 (×5): qty 1

## 2018-09-21 MED ORDER — MORPHINE SULFATE (PF) 4 MG/ML IV SOLN
4.0000 mg | INTRAVENOUS | Status: DC | PRN
  Administered 2018-09-21: 4 mg via INTRAVENOUS
  Filled 2018-09-21: qty 1

## 2018-09-21 MED ORDER — OXYCODONE HCL 5 MG PO TABS
5.0000 mg | ORAL_TABLET | ORAL | Status: DC | PRN
  Administered 2018-09-21 – 2018-09-23 (×6): 5 mg via ORAL
  Filled 2018-09-21 (×7): qty 1

## 2018-09-21 NOTE — ED Provider Notes (Signed)
_ [facility]-EMERGENCY DEPT Provider Note   CSN: [id number] Arrival date & time: 09/21/18  0847    History   Chief Complaint Chief Complaint  Patient presents with  . Fall  . L hip pain  . L shoulder pain  . Hypertension    HPI [name] is a 73 y.o. female.     HPI Patient presents after a fall. The fall was 2 days ago, mechanical, the patient recalls the entirety of the event, stating that she tripped, while using her cane, trying to kill a spider. Since the fall she has had pain in her left hip, lateral, left shoulder, posterior and back, diffuse. Pain is sore, severe, worse with attempted ambulation, motion. She has, however, been ambulatory. No head trauma, no head or neck pain, no syncope, no weakness that is new. She has a history of stroke last year, has ongoing left-sided weakness, this is unchanged. She is here with her daughter who assists with the HPI. Patient also has a history of hypertension, has not taken her morning medication yet. Past Medical History:  Diagnosis Date  . Allergy   . Arthritis   . Cataract   . Diabetes mellitus without complication (HCC)   . Hypertension   . Macular degeneration syndrome    with edema---being treated.     Patient Active Problem List   Diagnosis Date Noted  . Allergic reaction caused by a drug 05/06/2018  . Bilateral leg edema 04/13/2018  . HLD (hyperlipidemia) 03/23/2018  . Mood swings 03/23/2018  . Chronic kidney disease (CKD), stage III (moderate) (HCC)   . Embolic stroke (HCC) 02/27/2018  . Left hemiparesis (HCC)   . Diabetes mellitus type 2 in nonobese (HCC)   . Acute ischemic stroke (HCC)   . Left arm weakness   . Acute CVA (cerebrovascular accident) (HCC) 02/24/2018  . Macular edema due to secondary diabetes (HCC) 02/24/2018  . Overweight (BMI 25.0-29.9) 02/24/2018  . CKD (chronic kidney disease), stage III (HCC) 02/24/2018  . Diabetes mellitus without complication (HCC)  09/02/2017  . HTN (hypertension) 09/02/2017  . Knee pain, bilateral 09/02/2017    Past Surgical History:  Procedure Laterality Date  . callous removal  Left 2017   located on 1st digit on L foot 2/2 diabetes  . EYE SURGERY       OB History   No obstetric history on file.      Home Medications    Prior to Admission medications   Medication Sig Start Date End Date Taking? Authorizing Provider  acetaminophen (TYLENOL) 500 MG tablet Take 1,000 mg by mouth every 6 (six) hours as needed for moderate pain.   Yes [provider]  aspirin EC 81 MG tablet Take 81 mg by mouth daily.   Yes [provider]  BLACK CURRANT SEED OIL PO Take 1 capsule by mouth daily.   Yes [provider]  fexofenadine (ALLEGRA) 180 MG tablet Take 180 mg by mouth daily.   Yes [provider]  losartan (COZAAR) 25 MG tablet Take 25 mg by mouth daily.   Yes [provider]  azithromycin (ZITHROMAX) 250 MG tablet 2 tabs by mouth on day 1 followed by 1 tab by mouth daily days 2- 5 Patient not taking: Reported on 09/21/2018 09/14/18   Aron, Talia M, MD  blood glucose meter kit and supplies KIT Dispense based on patient and insurance preference. UAD bid for glucose monitoring Dx: E11.9 03/17/18   Aron, Talia M, MD    Blood Pressure Monitoring (BLOOD PRESSURE MONITOR AUTOMAT) DEVI UAD for BP monitoring; Dx: I10 & I16.0 03/17/18   Aron, Talia M, MD  diclofenac sodium (VOLTAREN) 1 % GEL Apply 2 g topically 4 (four) times daily. Patient not taking: Reported on 09/21/2018 09/02/17   Aron, Talia M, MD  fluticasone (FLONASE) 50 MCG/ACT nasal spray Place 2 sprays into both nostrils daily as needed for allergies.     [provider]  glucose blood test strip UAD bid for glucose monitoring Dx: E11.9 03/17/18   Aron, Talia M, MD  hydrALAZINE (APRESOLINE) 25 MG tablet Take 1 tablet (25 mg total) by mouth 4 (four) times daily. Patient not taking: Reported on 09/21/2018 06/09/18   Aron, Talia  M, MD  Lancet Device MISC UAD bid for glucose monitoring Dx: E11.9 03/17/18   Aron, Talia M, MD    Family History Family History  Problem Relation Age of Onset  . Kidney disease Mother   . Congestive Heart Failure Father   . COPD Father   . COPD Brother   . Diabetes Maternal Aunt     Social History Social History   Tobacco Use  . Smoking status: Never Smoker  . Smokeless tobacco: Never Used  Substance Use Topics  . Alcohol use: No  . Drug use: No     Allergies   Clonidine; Norvasc [amlodipine besylate]; Lisinopril; Amlodipine; and Codeine   Review of Systems Review of Systems  Constitutional:       Per HPI, otherwise negative  HENT:       Per HPI, otherwise negative  Respiratory:       Per HPI, otherwise negative  Cardiovascular:       Per HPI, otherwise negative  Gastrointestinal: Negative for vomiting.  Endocrine:       Negative aside from HPI  Genitourinary:       Neg aside from HPI   Musculoskeletal:       Per HPI, otherwise negative  Skin: Negative.   Neurological: Positive for weakness. Negative for syncope.     Physical Exam Updated Vital Signs BP (!) 164/60   Pulse 71   Temp 98.8 F (37.1 C) (Oral)   Resp 16   Ht 5' 2" (1.575 m)   Wt 68 kg   SpO2 92%   BMI 27.44 kg/m   Physical Exam Vitals signs and nursing note reviewed.  Constitutional:      Appearance: She is well-developed.     Comments: Sickly appearing elderly female  HENT:     Head: Normocephalic and atraumatic.  Eyes:     Conjunctiva/sclera: Conjunctivae normal.  Neck:     Musculoskeletal: No spinous process tenderness or muscular tenderness.  Cardiovascular:     Rate and Rhythm: Normal rate and regular rhythm.  Pulmonary:     Effort: Pulmonary effort is normal. No respiratory distress.     Breath sounds: Normal breath sounds. No stridor.  Abdominal:     General: There is no distension.  Musculoskeletal:     Right shoulder: Normal.     Left elbow: Normal.     Right  hip: Normal.     Right knee: Normal.     Left knee: Normal.       Arms:       Legs:  Skin:    General: Skin is warm and dry.  Neurological:     Mental Status: She is alert and oriented to person, place, and time.     Cranial Nerves: No cranial   nerve deficit.     Comments: 4/5 strength left upper extremity, patient notes this is new normal Face symmetric, speech clear, cranial nerves unremarkable      ED Treatments / Results  Labs (all labs ordered are listed, but only abnormal results are displayed) Labs Reviewed  BASIC METABOLIC PANEL - Abnormal; Notable for the following components:      Result Value   Glucose, Bld 118 (*)    Creatinine, Ser 1.58 (*)    GFR calc non Af Amer 32 (*)    GFR calc Af Amer 37 (*)    All other components within normal limits  CBC WITH DIFFERENTIAL/PLATELET    EKG None  Radiology Dg Thoracic Spine 2 View  Result Date: 09/21/2018 CLINICAL DATA:  Back pain after fall 2 days ago. EXAM: THORACIC SPINE 2 VIEWS COMPARISON:  Chest x-ray dated February 18, 2018. FINDINGS: Twelve rib-bearing thoracic vertebral bodies. The upper thoracic spine is not well visualized on the lateral view. No acute fracture or subluxation. Vertebral body heights are preserved. Alignment is normal. Intervertebral disc spaces are maintained. Osteopenia. IMPRESSION: Negative. Electronically Signed   By: William T Derry M.D.   On: 09/21/2018 10:20   Dg Lumbar Spine Complete  Result Date: 09/21/2018 CLINICAL DATA:  Back pain after fall 2 days ago. EXAM: LUMBAR SPINE - COMPLETE 4+ VIEW COMPARISON:  None. FINDINGS: Five lumbar type vertebral bodies. No acute fracture or subluxation. Vertebral body heights are preserved. Alignment is normal. Intervertebral disc spaces are maintained. Osteopenia. Aortoiliac atherosclerosis. IMPRESSION: 1.  No acute osseous abnormality. Electronically Signed   By: William T Derry M.D.   On: 09/21/2018 10:18   Ct Pelvis Wo Contrast  Result Date:  09/21/2018 CLINICAL DATA:  Fell yesterday with pelvic and hip pain on the left. Abnormal radiographs. EXAM: CT PELVIS WITHOUT CONTRAST TECHNIQUE: Multidetector CT imaging of the pelvis was performed following the standard protocol without intravenous contrast. COMPARISON:  Radiography same day FINDINGS: Urinary Tract:  Normal Bowel:  Normal except for ordinary diverticulosis. Vascular/Lymphatic: Atherosclerotic change without aneurysm or acute hemorrhage. Reproductive:  Normal Other:  No free fluid or air. Musculoskeletal: Nondisplaced fracture of the left sacral ala. Nondisplaced fractures of the left superior pubic ramus and pubic symphysis region. No evidence of femur fracture. No right-sided pelvic injury seen. IMPRESSION: Nondisplaced fracture of the left sacral ala. Nondisplaced fractures of the left superior pubic ramus and symphyseal region. No significant soft tissue hematoma. Electronically Signed   By: Mark  Shogry M.D.   On: 09/21/2018 12:27   Dg Shoulder Left  Result Date: 09/21/2018 CLINICAL DATA:  Fall 2 days ago. EXAM: LEFT SHOULDER - 2+ VIEW COMPARISON:  None. FINDINGS: There is no evidence of fracture or dislocation. There is no evidence of arthropathy or other focal bone abnormality. Osteopenia. Soft tissues are unremarkable. IMPRESSION: Negative. Electronically Signed   By: William T Derry M.D.   On: 09/21/2018 10:13   Dg Hip Unilat With Pelvis 2-3 Views Left  Result Date: 09/21/2018 CLINICAL DATA:  Fall 2 days ago. EXAM: DG HIP (WITH OR WITHOUT PELVIS) 2-3V LEFT COMPARISON:  None. FINDINGS: Slight irregularity of the left superior pubic ramus, suspicious for nondisplaced fracture. No additional fracture. No dislocation. The pubic symphysis and sacroiliac joints are intact. The hip joint spaces are preserved. Osteopenia. Soft tissues are unremarkable. IMPRESSION: 1. Suspected nondisplaced fracture of the left superior pubic ramus. Electronically Signed   By: William T Derry M.D.   On:  09/21/2018 10:17    Procedures Procedures (including   critical care time)  Medications Ordered in ED Medications  0.9 %  sodium chloride infusion ( Intravenous New Bag/Given 09/21/18 1319)  hydrALAZINE (APRESOLINE) tablet 25 mg (25 mg Oral Given 09/21/18 1311)  fentaNYL (SUBLIMAZE) injection 25 mcg (25 mcg Intramuscular Given 09/21/18 0916)  fentaNYL (SUBLIMAZE) injection 25 mcg (25 mcg Intramuscular Given 09/21/18 1050)  fentaNYL (SUBLIMAZE) injection 25 mcg (25 mcg Intramuscular Given 09/21/18 1246)  morphine 4 MG/ML injection 4 mg (4 mg Intravenous Given 09/21/18 1321)     Initial Impression / Assessment and Plan / ED Course  I have reviewed the triage vital signs and the nursing notes.  Pertinent labs & imaging results that were available during my care of the patient were reviewed by me and considered in my medical decision making (see chart for details).    On arrival the patient is 220/85 blood pressure, she received a dose of her home medication, losartan. Patient continues to have pain after initial empiric fentanyl dose.  Update:, Pain is persistent, and with x-ray suggesting fracture, patient continues to need additional analgesia and consideration of occult fractures, CT scan ordered. Discussed initial x-ray findings with the patient, her daughter.    2:38 PM Patient has received 3 doses of fentanyl, 1 dose of Dilaudid, now with better pain control. After initial losartan, the patient also received hydralazine, and blood pressure now 160/60. No lab evidence of endorgan damage. CT concerning for multiple pelvis fractures, and given need for pain control, facilitation of therapy given fall with multiple fractures, and monitoring for her blood pressure she was admitted for further evaluation and management.  Final Clinical Impressions(s) / ED Diagnoses   Final diagnoses:  Fall, initial encounter  Hypertensive urgency  Multiple closed fractures of pelvis without disruption  of pelvic ring, initial encounter [facility])     Carmin Muskrat, MD 09/21/18 1452

## 2018-09-21 NOTE — ED Notes (Signed)
ED TO INPATIENT HANDOFF REPORT  ED Nurse Name and Phone #:  Maddie RN  S Name/Age/Gender [name] 73 y.o. female Room/Bed: WA05/WA05  Code Status   Code Status: Prior  Home/SNF/Other Home Patient oriented to: A&Ox4 Is this baseline? Yes   Triage Complete: Triage complete  Chief Complaint fall  Triage Note Per EMS: Pt from home.  On Saturday pt saw a spider, tried to hit it, then lost her balance and fell.  Yesterday pt needed assistance to ambulate.  Pt c/o of L back, hip, and shoulder pain.  Pt unable to get out of bed this morning.  Pt unable to straighten L leg.  Pt usually ambulates with a cane.   Allergies Allergies  Allergen Reactions  . Clonidine Anaphylaxis, Swelling and Other (See Comments)    Tongue swelling  . Norvasc [Amlodipine Besylate] Swelling    Edema and TONGUE swelling   . Lisinopril Cough  . Amlodipine Anxiety, Nausea Only and Swelling  . Codeine Itching and Rash    Level of Care/Admitting Diagnosis ED Disposition    ED Disposition Condition Comment   Admit  Hospital Area: [facility] [[id number]]  Level of Care: Med-Surg [16]  Diagnosis: Pelvic fracture [facility]) [[id number]]  Admitting Physician: Junius Argyle  Attending Physician: Annita Brod [[id number]]  PT Class (Do Not Modify): Observation [104]  PT Acc Code (Do Not Modify): Observation [10022]       B Medical/Surgery History Past Medical History:  Diagnosis Date  . Allergy   . Arthritis   . Cataract   . Diabetes mellitus without complication ([place])   . Hypertension   . Macular degeneration syndrome    with edema---being treated.    Past Surgical History:  Procedure Laterality Date  . callous removal  Left 2017   located on 1st digit on L foot 2/2 diabetes  . EYE SURGERY       A IV Location/Drains/Wounds Patient Lines/Drains/Airways Status   Active Line/Drains/Airways    Name:   Placement date:   Placement time:   Site:   Days:    Peripheral IV 09/21/18 Right Antecubital   09/21/18    1316    Antecubital   less than 1          Intake/Output Last 24 hours No intake or output data in the 24 hours ending 09/21/18 1645  Labs/Imaging Results for orders placed or performed during the hospital encounter of 09/21/18 (from the past 48 hour(s))  CBC with Differential     Status: None   Collection Time: 09/21/18  1:10 PM  Result Value Ref Range   WBC 10.3 4.0 - 10.5 K/uL   RBC 4.45 3.87 - 5.11 MIL/uL   Hemoglobin 12.4 12.0 - 15.0 g/dL   HCT 37.8 36.0 - 46.0 %   MCV 84.9 80.0 - 100.0 fL   MCH 27.9 26.0 - 34.0 pg   MCHC 32.8 30.0 - 36.0 g/dL   RDW 13.4 11.5 - 15.5 %   Platelets 267 150 - 400 K/uL   nRBC 0.0 0.0 - 0.2 %   Neutrophils Relative % 72 %   Neutro Abs 7.3 1.7 - 7.7 K/uL   Lymphocytes Relative 13 %   Lymphs Abs 1.4 0.7 - 4.0 K/uL   Monocytes Relative 9 %   Monocytes Absolute 0.9 0.1 - 1.0 K/uL   Eosinophils Relative 5 %   Eosinophils Absolute 0.5 0.0 - 0.5 K/uL   Basophils Relative 1 %  Basophils Absolute 0.1 0.0 - 0.1 K/uL   Immature Granulocytes 0 %   Abs Immature Granulocytes 0.03 0.00 - 0.07 K/uL    Comment: Performed at [facility], [place] [address] [place], [place] [address]  Basic metabolic panel     Status: Abnormal   Collection Time: 09/21/18  1:10 PM  Result Value Ref Range   Sodium 140 135 - 145 mmol/L   Potassium 3.8 3.5 - 5.1 mmol/L   Chloride 107 98 - 111 mmol/L   CO2 24 22 - 32 mmol/L   Glucose, Bld 118 (H) 70 - 99 mg/dL   BUN 21 8 - 23 mg/dL   Creatinine, Ser 1.58 (H) 0.44 - 1.00 mg/dL   Calcium 9.0 8.9 - 10.3 mg/dL   GFR calc non Af Amer 32 (L) >60 mL/min   GFR calc Af Amer 37 (L) >60 mL/min   Anion gap 9 5 - 15    Comment: Performed at [facility], [place] [address], [place],  [address]   Dg Thoracic Spine 2 View  Result Date: 09/21/2018 CLINICAL DATA:  Back pain after fall 2 days ago. EXAM: THORACIC SPINE 2 VIEWS COMPARISON:   Chest x-ray dated February 18, 2018. FINDINGS: Twelve rib-bearing thoracic vertebral bodies. The upper thoracic spine is not well visualized on the lateral view. No acute fracture or subluxation. Vertebral body heights are preserved. Alignment is normal. Intervertebral disc spaces are maintained. Osteopenia. IMPRESSION: Negative. Electronically Signed   By: Titus Dubin M.D.   On: 09/21/2018 10:20   Dg Lumbar Spine Complete  Result Date: 09/21/2018 CLINICAL DATA:  Back pain after fall 2 days ago. EXAM: LUMBAR SPINE - COMPLETE 4+ VIEW COMPARISON:  None. FINDINGS: Five lumbar type vertebral bodies. No acute fracture or subluxation. Vertebral body heights are preserved. Alignment is normal. Intervertebral disc spaces are maintained. Osteopenia. Aortoiliac atherosclerosis. IMPRESSION: 1.  No acute osseous abnormality. Electronically Signed   By: Titus Dubin M.D.   On: 09/21/2018 10:18   Ct Pelvis Wo Contrast  Result Date: 09/21/2018 CLINICAL DATA:  [place] yesterday with pelvic and hip pain on the left. Abnormal radiographs. EXAM: CT PELVIS WITHOUT CONTRAST TECHNIQUE: Multidetector CT imaging of the pelvis was performed following the standard protocol without intravenous contrast. COMPARISON:  Radiography same day FINDINGS: Urinary Tract:  Normal Bowel:  Normal except for ordinary diverticulosis. Vascular/Lymphatic: Atherosclerotic change without aneurysm or acute hemorrhage. Reproductive:  Normal Other:  No free fluid or air. Musculoskeletal: Nondisplaced fracture of the left sacral ala. Nondisplaced fractures of the left superior pubic ramus and pubic symphysis region. No evidence of femur fracture. No right-sided pelvic injury seen. IMPRESSION: Nondisplaced fracture of the left sacral ala. Nondisplaced fractures of the left superior pubic ramus and symphyseal region. No significant soft tissue hematoma. Electronically Signed   By: Nelson Chimes M.D.   On: 09/21/2018 12:27   Dg Shoulder Left  Result Date:  09/21/2018 CLINICAL DATA:  Fall 2 days ago. EXAM: LEFT SHOULDER - 2+ VIEW COMPARISON:  None. FINDINGS: There is no evidence of fracture or dislocation. There is no evidence of arthropathy or other focal bone abnormality. Osteopenia. Soft tissues are unremarkable. IMPRESSION: Negative. Electronically Signed   By: Titus Dubin M.D.   On: 09/21/2018 10:13   Dg Hip Unilat With Pelvis 2-3 Views Left  Result Date: 09/21/2018 CLINICAL DATA:  Fall 2 days ago. EXAM: DG HIP (WITH OR WITHOUT PELVIS) 2-3V LEFT COMPARISON:  None. FINDINGS: Slight irregularity of the left superior pubic  ramus, suspicious for nondisplaced fracture. No additional fracture. No dislocation. The pubic symphysis and sacroiliac joints are intact. The hip joint spaces are preserved. Osteopenia. Soft tissues are unremarkable. IMPRESSION: 1. Suspected nondisplaced fracture of the left superior pubic ramus. Electronically Signed   By: Titus Dubin M.D.   On: 09/21/2018 10:17    Pending Labs Unresulted Labs (From admission, onward)   None      Vitals/Pain Today's Vitals   09/21/18 1311 09/21/18 1357 09/21/18 1401 09/21/18 1600  BP: (!) 225/82  (!) 164/60 (!) 206/66  Pulse:   71 81  Resp:   16 16  Temp:      TempSrc:      SpO2:   92% 97%  Weight:      Height:      PainSc:  4       Isolation Precautions No active isolations  Medications Medications  0.9 %  sodium chloride infusion ( Intravenous New Bag/Given 09/21/18 1319)  morphine 4 MG/ML injection 4 mg (4 mg Intravenous Given 09/21/18 1618)  acetaminophen (TYLENOL) tablet 1,000 mg (has no administration in time range)  aspirin EC tablet 81 mg (has no administration in time range)  loratadine (CLARITIN) tablet 10 mg (has no administration in time range)  losartan (COZAAR) tablet 25 mg (has no administration in time range)  hydrALAZINE (APRESOLINE) tablet 25 mg (25 mg Oral Given 09/21/18 1311)  fentaNYL (SUBLIMAZE) injection 25 mcg (25 mcg Intramuscular Given 09/21/18  0916)  fentaNYL (SUBLIMAZE) injection 25 mcg (25 mcg Intramuscular Given 09/21/18 1050)  fentaNYL (SUBLIMAZE) injection 25 mcg (25 mcg Intramuscular Given 09/21/18 1246)  morphine 4 MG/ML injection 4 mg (4 mg Intravenous Given 09/21/18 1321)    Mobility non-ambulatory High fall risk   Focused Assessments Muskuloskeletal   R Recommendations: See Admitting Provider Note  Report given to:  Dominica RN  Additional Notes:  Hypertension

## 2018-09-21 NOTE — ED Triage Notes (Signed)
Per EMS: Pt from home.  On Saturday pt saw a spider, tried to hit it, then lost her balance and fell.  Yesterday pt needed assistance to ambulate.  Pt c/o of L back, hip, and shoulder pain.  Pt unable to get out of bed this morning.  Pt unable to straighten L leg.  Pt usually ambulates with a cane.

## 2018-09-21 NOTE — ED Notes (Signed)
Bed: KB52 Expected date:  Expected time:  Means of arrival:  Comments: EMS fall 73 yo

## 2018-09-21 NOTE — H&P (Signed)
History and Physical  [name] ZOX:[id number] DOB: 03-13-1946 DOA: 09/21/2018  Referring physician: Adrian Prows, ER physician PCP: Lucille Passy, MD  Outpatient Specialists: None Patient coming from: Home & is able to ambulate with use of a walker, but in the last few days, had stopped using it  Chief Complaint: Fall and pain  HPI: [name] is a 73 y.o. 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  She was trying to kill a spider 2 days ago when she lost her balance and fell landing on her left side.  Initially she was able to get up with minimal pain, however the following day, she started having severe pain to where she could barely walk.  The pain persisted into today and her family brought her into the emergency room.  ED Course: In the emergency room, CT scan noted multiple pelvic fractures, all of which were nonoperable.  However attempts to ambulate the patient even with some pain medication were quite limited.  Hospitalist were called for further evaluation  Review of Systems: Patient seen after arrival to floor. Pt complains of minimal pain as long as she is not moving  Pt denies any headaches, vision changes, dysphagia, chest pain, palpitations, shortness of breath, wheeze, cough, abdominal pain, hematuria, dysuria, constipation, diarrhea, focal extremity numbness weakness or pain.  Review of systems are otherwise negative   Past Medical History:  Diagnosis Date  . Allergy   . Arthritis   . Cataract   . Diabetes mellitus without complication ([place])   . Hypertension   . Macular degeneration syndrome    with edema---being treated.    Past Surgical History:  Procedure Laterality Date  . callous  removal  Left 2017   located on 1st digit on L foot 2/2 diabetes  . EYE SURGERY      Social History:  reports that she has never smoked. She has never used smokeless tobacco. She reports that she does not drink alcohol or use drugs.   Allergies  Allergen Reactions  . Clonidine Anaphylaxis, Swelling and Other (See Comments)    Tongue swelling  . Norvasc [Amlodipine Besylate] Swelling    Edema and TONGUE swelling   . Lisinopril Cough  . Amlodipine Anxiety, Nausea Only and Swelling  . Codeine Itching and Rash    Family History  Problem Relation Age of Onset  . Kidney disease Mother   . Congestive Heart Failure Father   . COPD Father   . COPD Brother   . Diabetes Maternal Aunt       Prior to Admission medications   Medication Sig Start Date End Date Taking? Authorizing Provider  acetaminophen (TYLENOL) 500 MG tablet Take 1,000 mg by mouth every 6 (six) hours as needed for moderate pain.   Yes [provider]  aspirin EC 81 MG tablet Take 81 mg by mouth daily.   Yes [provider]  BLACK CURRANT SEED OIL PO Take 1 capsule by mouth daily.   Yes [provider]  fexofenadine (ALLEGRA) 180 MG tablet Take 180 mg by mouth daily.   Yes [provider]  losartan (COZAAR) 25 MG tablet Take 25 mg by mouth daily.   Yes [provider]  blood glucose  meter kit and supplies KIT Dispense based on patient and insurance preference. UAD bid for glucose monitoring Dx: E11.9 03/17/18   Lucille Passy, MD  Blood Pressure Monitoring (BLOOD PRESSURE MONITOR AUTOMAT) DEVI UAD for BP monitoring; Dx: W11 & I16.0 03/17/18   Lucille Passy, MD  fluticasone Asencion Islam) 50 MCG/ACT nasal spray Place 2 sprays into both nostrils daily as needed for allergies.     [provider]  glucose blood test strip UAD bid for glucose monitoring Dx: E11.9 03/17/18   Lucille Passy, MD  Lancet Device MISC UAD bid for glucose monitoring Dx: E11.9 03/17/18   Lucille Passy, MD     Physical Exam: BP (!) 190/62 (BP Location: Right Arm)   Pulse 75   Temp 98.2 F (36.8 C) (Oral)   Resp 14   Ht _0  (1.575 m)   Wt 68 kg   SpO2 94%   BMI 27.44 kg/m   General: Alert and oriented x3, no acute distress, fatigued Eyes: Sclera nonicteric, extraocular movements are intact ENT: Normocephalic and atraumatic, mucous membranes are slightly dry Neck: Supple, no JVD Cardiovascular: Regular rate and rhythm, S1-S2 Respiratory: Clear to auscultation bilaterally Abdomen: Soft, nontender, nondistended, positive bowel sounds Skin: Some bruising, but no skin breaks, tears or other lesions Musculoskeletal: No clubbing or cyanosis, trace pitting edema Psychiatric: Appropriate, no evidence of psychoses Neurologic: Patient has very very mild weakness on left side with 5- in her grip as compared to 5 on her right.  It is however slightly subjective          Labs on Admission:  Basic Metabolic Panel: Recent Labs  Lab 09/21/18 1310  NA 140  K 3.8  CL 107  CO2 24  GLUCOSE 118*  BUN 21  CREATININE 1.58*  CALCIUM 9.0   Liver Function Tests: No results for input(s): AST, ALT, ALKPHOS, BILITOT, PROT, ALBUMIN in the last 168 hours. No results for input(s): LIPASE, AMYLASE in the last 168 hours. No results for input(s): AMMONIA in the last 168 hours. CBC: Recent Labs  Lab 09/21/18 1310  WBC 10.3  NEUTROABS 7.3  HGB 12.4  HCT 37.8  MCV 84.9  PLT 267   Cardiac Enzymes: No results for input(s): CKTOTAL, CKMB, CKMBINDEX, TROPONINI in the last 168 hours.  BNP (last 3 results) No results for input(s): BNP in the last 8760 hours.  ProBNP (last 3 results) No results for input(s): PROBNP in the last 8760 hours.  CBG: Recent Labs  Lab 09/21/18 1843  GLUCAP 92    Radiological Exams on Admission: Dg Thoracic Spine 2 View  Result Date: 09/21/2018 CLINICAL DATA:  Back pain after fall 2 days ago. EXAM: THORACIC SPINE 2 VIEWS COMPARISON:  Chest x-ray dated February 18, 2018. FINDINGS: Twelve rib-bearing thoracic vertebral bodies. The upper thoracic spine is not well visualized on the lateral view. No acute fracture or subluxation. Vertebral body heights are preserved. Alignment is normal. Intervertebral disc spaces are maintained. Osteopenia. IMPRESSION: Negative. Electronically Signed   By: Titus Dubin M.D.   On: 09/21/2018 10:20   Dg Lumbar Spine Complete  Result Date: 09/21/2018 CLINICAL DATA:  Back pain after fall 2 days ago. EXAM: LUMBAR SPINE - COMPLETE 4+ VIEW COMPARISON:  None. FINDINGS: Five lumbar type vertebral bodies. No acute fracture or subluxation. Vertebral body heights are preserved. Alignment is normal. Intervertebral disc spaces are maintained. Osteopenia. Aortoiliac atherosclerosis. IMPRESSION: 1.  No acute osseous abnormality. Electronically Signed   By: Orville Govern.D.  On: 09/21/2018 10:18   Ct Pelvis Wo Contrast  Result Date: 09/21/2018 CLINICAL DATA:  [place] yesterday with pelvic and hip pain on the left. Abnormal radiographs. EXAM: CT PELVIS WITHOUT CONTRAST TECHNIQUE: Multidetector CT imaging of the pelvis was performed following the standard protocol without intravenous contrast. COMPARISON:  Radiography same day FINDINGS: Urinary Tract:  Normal Bowel:  Normal except for ordinary diverticulosis. Vascular/Lymphatic: Atherosclerotic change without aneurysm or acute hemorrhage. Reproductive:  Normal Other:  No free fluid or air. Musculoskeletal: Nondisplaced fracture of the left sacral ala. Nondisplaced fractures of the left superior pubic ramus and pubic symphysis region. No evidence of femur fracture. No right-sided pelvic injury seen. IMPRESSION: Nondisplaced fracture of the left sacral ala. Nondisplaced fractures of the left superior pubic ramus and symphyseal region. No significant soft tissue hematoma. Electronically Signed   By: Nelson Chimes M.D.   On: 09/21/2018 12:27   Dg Shoulder Left  Result Date: 09/21/2018 CLINICAL DATA:   Fall 2 days ago. EXAM: LEFT SHOULDER - 2+ VIEW COMPARISON:  None. FINDINGS: There is no evidence of fracture or dislocation. There is no evidence of arthropathy or other focal bone abnormality. Osteopenia. Soft tissues are unremarkable. IMPRESSION: Negative. Electronically Signed   By: Titus Dubin M.D.   On: 09/21/2018 10:13   Dg Hip Unilat With Pelvis 2-3 Views Left  Result Date: 09/21/2018 CLINICAL DATA:  Fall 2 days ago. EXAM: DG HIP (WITH OR WITHOUT PELVIS) 2-3V LEFT COMPARISON:  None. FINDINGS: Slight irregularity of the left superior pubic ramus, suspicious for nondisplaced fracture. No additional fracture. No dislocation. The pubic symphysis and sacroiliac joints are intact. The hip joint spaces are preserved. Osteopenia. Soft tissues are unremarkable. IMPRESSION: 1. Suspected nondisplaced fracture of the left superior pubic ramus. Electronically Signed   By: Titus Dubin M.D.   On: 09/21/2018 10:17    EKG: Not done  Assessment/Plan Present on Admission: . Pelvic fracture [facility]): Multiple fractures, with nondisplaced fracture of left sacral ala as well as of left superior pubic rami and pubic symphysis.  No need for surgery.  Pain control with IV and p.o. pain medication.  PT and OT to see.  May end up needing short-term skilled nursing or inpatient rehab.  Prior to injury, patient had become quite active and mobile . 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 Overweight (BMI 25.0-29.9): Patient meets criteria with BMI greater than 25  . CKD (chronic kidney disease), stage III ([place]): Creatinine currently at 1.58 with GFR of 32, at patient's baseline Principal Problem:   Pelvic fracture ([place]) Active Problems: Essential hypertension: Patient's blood pressure on admission elevated.  However she, she did not take her home medications this morning.  Have resumed those.    Diabetes mellitus without complication ([place]): Sliding scale.  History of recent CVA 6 months prior with mild  left-sided hemiplegia: Patient had gotten to the point where she was quite ambulatory and did not need a walker.  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 ([place])   Overweight (BMI 25.0-29.9)   CKD (chronic kidney disease), stage III (HCC)   DVT prophylaxis: Lovenox  Code Status: Full code  Family Communication: Daughter at the bedside  Disposition Plan: Awaiting PT and OT evaluation as we get pain under control  Consults called: None  Admission status: Given possibility of disposition discharge planning once evaluated by PT and OT, have placed under observation    Annita Brod MD  [facility] Pager [phone]  If 7PM-7AM, please contact night-coverage [email or web address] Password [facility]  09/21/2018, 6:47 PM

## 2018-09-21 NOTE — ED Notes (Signed)
Patient transported to CT 

## 2018-09-21 NOTE — ED Notes (Signed)
Transport called to take pt upstairs 

## 2018-09-22 ENCOUNTER — Observation Stay (HOSPITAL_COMMUNITY): Payer: Medicare Other

## 2018-09-22 DIAGNOSIS — S329XXD Fracture of unspecified parts of lumbosacral spine and pelvis, subsequent encounter for fracture with routine healing: Secondary | ICD-10-CM

## 2018-09-22 DIAGNOSIS — N183 Chronic kidney disease, stage 3 (moderate): Secondary | ICD-10-CM | POA: Diagnosis not present

## 2018-09-22 DIAGNOSIS — E119 Type 2 diabetes mellitus without complications: Secondary | ICD-10-CM

## 2018-09-22 DIAGNOSIS — I160 Hypertensive urgency: Secondary | ICD-10-CM | POA: Diagnosis not present

## 2018-09-22 LAB — HEMOGLOBIN A1C
Hgb A1c MFr Bld: 6.2 % — ABNORMAL HIGH (ref 4.8–5.6)
Mean Plasma Glucose: 131.24 mg/dL

## 2018-09-22 LAB — CBC
HCT: 36.7 % (ref 36.0–46.0)
Hemoglobin: 11.7 g/dL — ABNORMAL LOW (ref 12.0–15.0)
MCH: 27.7 pg (ref 26.0–34.0)
MCHC: 31.9 g/dL (ref 30.0–36.0)
MCV: 87 fL (ref 80.0–100.0)
NRBC: 0 % (ref 0.0–0.2)
Platelets: 206 10*3/uL (ref 150–400)
RBC: 4.22 MIL/uL (ref 3.87–5.11)
RDW: 13.6 % (ref 11.5–15.5)
WBC: 8.8 10*3/uL (ref 4.0–10.5)

## 2018-09-22 LAB — BASIC METABOLIC PANEL
Anion gap: 7 (ref 5–15)
BUN: 22 mg/dL (ref 8–23)
CALCIUM: 8.5 mg/dL — AB (ref 8.9–10.3)
CO2: 23 mmol/L (ref 22–32)
Chloride: 110 mmol/L (ref 98–111)
Creatinine, Ser: 1.73 mg/dL — ABNORMAL HIGH (ref 0.44–1.00)
GFR calc Af Amer: 33 mL/min — ABNORMAL LOW (ref >60)
GFR calc non Af Amer: 29 mL/min — ABNORMAL LOW (ref >60)
Glucose, Bld: 109 mg/dL — ABNORMAL HIGH (ref 70–99)
Potassium: 4.2 mmol/L (ref 3.5–5.1)
Sodium: 140 mmol/L (ref 135–145)

## 2018-09-22 LAB — GLUCOSE, CAPILLARY
Glucose-Capillary: 107 mg/dL — ABNORMAL HIGH (ref 70–99)
Glucose-Capillary: 109 mg/dL — ABNORMAL HIGH (ref 70–99)
Glucose-Capillary: 106 mg/dL — ABNORMAL HIGH (ref 70–99)
Glucose-Capillary: 81 mg/dL (ref 70–99)

## 2018-09-22 MED ORDER — DIPHENHYDRAMINE HCL 50 MG/ML IJ SOLN
12.5000 mg | Freq: Four times a day (QID) | INTRAMUSCULAR | Status: DC | PRN
  Administered 2018-09-22: 12.5 mg via INTRAVENOUS
  Administered 2018-09-22 – 2018-09-23 (×2): 25 mg via INTRAVENOUS
  Filled 2018-09-22 (×3): qty 1

## 2018-09-22 MED ORDER — HYDRALAZINE HCL 20 MG/ML IJ SOLN
10.0000 mg | INTRAMUSCULAR | Status: DC | PRN
  Administered 2018-09-26 (×2): 10 mg via INTRAVENOUS
  Filled 2018-09-22 (×2): qty 1

## 2018-09-22 NOTE — TOC Initial Note (Signed)
Transition of Care [facility]) - Initial/Assessment Note    Patient Details  Name: [name] MRN: [id number] Date of Birth: 1946/04/19  [facility] CM/SW Contact:    Dessa Phi, RN Phone Number: 09/22/2018, 3:20 PM  Clinical Narrative: Fall, pain. Pelvic fractures. From home w/spouse. Dtr lives w/patient-Leiah [phone]. PT/OT recc SNF-patient defers to dtr d/t pain.     They prefer to wait until pain is controlled before deciding about d/c plans. They prefer CIR-they have gone to [facility] CIR in past. Will monitor states & d/c plans.               Expected Discharge Plan: Skilled Nursing Facility Barriers to Discharge: No Barriers Identified   Patient Goals and CMS Choice Patient states their goals for this hospitalization and ongoing recovery are:: (get this pain under control)      Expected Discharge Plan and Services Expected Discharge Plan: B and E Discharge Planning Services: CM Consult   Living arrangements for the past 2 months: Single Family Home Expected Discharge Date: (unknown)                        Prior Living Arrangements/Services Living arrangements for the past 2 months: Single Family Home Lives with:: Adult Children, Spouse Patient language and need for interpreter reviewed:: Yes Do you feel safe going back to the place where you live?: Yes      Need for Family Participation in Patient Care: No (Comment) Care giver support system in place?: Yes (comment) Current home services: DME(cane) Criminal Activity/Legal Involvement Pertinent to Current Situation/Hospitalization: No - Comment as needed  Activities of Daily Living Home Assistive Devices/Equipment: None ADL Screening (condition at time of admission) Patient's cognitive ability adequate to safely complete daily activities?: Yes Is the patient deaf or have difficulty hearing?: No Does the patient have difficulty seeing, even when wearing glasses/contacts?:  No Does the patient have difficulty concentrating, remembering, or making decisions?: No Patient able to express need for assistance with ADLs?: Yes Does the patient have difficulty dressing or bathing?: Yes Independently performs ADLs?: No Communication: Independent Dressing (OT): Needs assistance Is this a change from baseline?: Change from baseline, expected to last >3 days Grooming: Independent Feeding: Independent Bathing: Needs assistance Is this a change from baseline?: Change from baseline, expected to last >3 days Toileting: Needs assistance Is this a change from baseline?: Change from baseline, expected to last >3days In/Out Bed: Needs assistance Is this a change from baseline?: Change from baseline, expected to last >3 days Walks in Home: Needs assistance Is this a change from baseline?: Change from baseline, expected to last >3 days Does the patient have difficulty walking or climbing stairs?: Yes Weakness of Legs: Both Weakness of Arms/Hands: Both  Permission Sought/Granted Permission sought to share information with : Case Manager Permission granted to share information with : Yes, Verbal Permission Granted  Share Information with NAME: (dtr Vickey Huger)  Permission granted to share info w AGENCY: (Yes. All if needed)  Permission granted to share info w Relationship: (daughter Vickey Huger)  Permission granted to share info w Contact Information: [phone])  Emotional Assessment Appearance:: Appears stated age, Well-Groomed Attitude/Demeanor/Rapport: Engaged Affect (typically observed): Accepting Orientation: : Oriented to Self, Oriented to  Time, Oriented to Place, Oriented to Situation Alcohol / Substance Use: Never Used Psych Involvement: No (comment)  Admission diagnosis:  Pain [R52] Hypertensive urgency [I16.0] Fall, initial encounter [W19.XXXA] Multiple closed fractures of  pelvis without disruption of pelvic ring, initial encounter [facility])  [S32.82XA] Patient Active Problem List   Diagnosis Date Noted  . Pelvic fracture ([place]) 09/21/2018  . Allergic reaction caused by a drug 05/06/2018  . Bilateral leg edema 04/13/2018  . HLD (hyperlipidemia) 03/23/2018  . Mood swings 03/23/2018  . Chronic kidney disease (CKD), stage III (moderate) (HCC)   . Embolic stroke ([place]) 17/71/1657  . Left hemiparesis ([place])   . Diabetes mellitus type 2 in nonobese (HCC)   . Acute ischemic stroke ([place])   . Left arm weakness   . Acute CVA (cerebrovascular accident) ([place]) 02/24/2018  . 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 Overweight (BMI 25.0-29.9) 02/24/2018  . CKD (chronic kidney disease), stage III ([place]) 02/24/2018  . Diabetes mellitus without complication ([place]) 90/38/3338  . HTN (hypertension) 09/02/2017  . Knee pain, bilateral 09/02/2017   PCP:  Lucille Passy, MD Pharmacy:   [facility] [address] [place]), [place] - [address] [place] [address] Phone: [phone] Fax: [phone]     Social Determinants of Health (SDOH) Interventions    Readmission Risk Interventions  No flowsheet data found.

## 2018-09-22 NOTE — Progress Notes (Signed)
PROGRESS NOTE    [name]  JYN:[id number] DOB: [birth year] DOA: 09/21/2018 PCP: Lucille Passy, MD    Brief Narrative:   [name] is a 73 y.o. 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 , CT scan noted multiple pelvic fractures, all of which were non operable. Orthopedics consulted and recommendations given.   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  Weight bearing as tolerated.  Pain control with IV and oral meds.  PT/ot evaluation.      Stage 3 CKD:  Creatinine at baseline.    Diabetes Mellitus:  CBG (last 3)  Recent Labs    09/22/18 0713 09/22/18 1156 09/22/18 1632  GLUCAP 107* 109* 106*  resume SSI.    Overweight BMI: - recommend follow up with PCP.    Fever :  Unclear etiology.  Work up pending. Get CXR, UA and blood cultures.  pulm toilet.     H/o CVA recent with left sided hemiplegia:  No new deficits.    Hypertension;  Not well controlled, suspect due to pain.       DVT prophylaxis: lovenox.  Code Status: full code.  Family Communication: daughter at bedside.  Disposition Plan: pending   Consultants:   Dr Mardelle Matte with Orthopedics.   Procedures:  None.   Antimicrobials: None.   Subjective: Pain not well controlled.   Objective: Vitals:   09/21/18 2052 09/22/18 0507 09/22/18 0621 09/22/18 1331  BP: (!) 139/57 (!) 174/65 (!) 145/53 (!) 174/61  Pulse: 74 81 81 75  Resp: 15 15  17   Temp: 98.2 F (36.8 C) 98.7 F (37.1 C)  99 F (37.2 C)   TempSrc: Oral Oral  Oral  SpO2: 91% 91%  96%  Weight:      Height:        Intake/Output Summary (Last 24 hours) at 09/22/2018 1632 Last data filed at 09/22/2018 1400 Gross per 24 hour  Intake 3742.54 ml  Output 390 ml  Net 3352.54 ml   Filed Weights   09/21/18 0847  Weight: 68 kg    Examination:  General exam:mild to moderate distress  Respiratory system: Clear to auscultation. Respiratory effort normal. Cardiovascular system: S1 & S2 heard, RRR. No JVD,  Gastrointestinal system: Abdomen is nondistended, soft and nontender. No organomegaly or masses felt. Normal bowel sounds heard. Central nervous system: Alert and oriented. left hemiplegia.  Extremities: painful ROM of lower extremities.  Skin: No rashes, lesions or ulcers Psychiatry:  Mood & affect appropriate.     Data Reviewed: I have personally reviewed following labs and imaging studies  CBC: Recent Labs  Lab 09/21/18 1310 09/22/18 0437  WBC 10.3 8.8  NEUTROABS 7.3  --   HGB 12.4 11.7*  HCT 37.8 36.7  MCV 84.9 87.0  PLT 267 865   Basic Metabolic Panel: Recent Labs  Lab 09/21/18 1310 09/22/18 0437  NA 140 140  K 3.8 4.2  CL 107 110  CO2 24 23  GLUCOSE 118* 109*  BUN 21 22  CREATININE 1.58* 1.73*  CALCIUM 9.0 8.5*  GFR: Estimated Creatinine Clearance: 26.2 mL/min (A) (by C-G formula based on SCr of 1.73 mg/dL (H)). Liver Function Tests: No results for input(s): AST, ALT, ALKPHOS, BILITOT, PROT, ALBUMIN in the last 168 hours. No results for input(s): LIPASE, AMYLASE in the last 168 hours. No results for input(s): AMMONIA in the last 168 hours. Coagulation Profile: No results for input(s): INR, PROTIME in the last 168 hours. Cardiac Enzymes: No results for input(s): CKTOTAL, CKMB, CKMBINDEX, TROPONINI in the last 168 hours. BNP (last 3 results) No results for input(s): PROBNP in the last 8760 hours. HbA1C: Recent Labs    09/22/18 0437  HGBA1C 6.2*   CBG: Recent Labs  Lab 09/21/18 1843  09/21/18 2208 09/22/18 0713 09/22/18 1156  GLUCAP 92 114* 107* 109*   Lipid Profile: No results for input(s): CHOL, HDL, LDLCALC, TRIG, CHOLHDL, LDLDIRECT in the last 72 hours. Thyroid Function Tests: No results for input(s): TSH, T4TOTAL, FREET4, T3FREE, THYROIDAB in the last 72 hours. Anemia Panel: No results for input(s): VITAMINB12, FOLATE, FERRITIN, TIBC, IRON, RETICCTPCT in the last 72 hours. Sepsis Labs: No results for input(s): PROCALCITON, LATICACIDVEN in the last 168 hours.  No results found for this or any previous visit (from the past 240 hour(s)).       Radiology Studies: Dg Thoracic Spine 2 View  Result Date: 09/21/2018 CLINICAL DATA:  Back pain after fall 2 days ago. EXAM: THORACIC SPINE 2 VIEWS COMPARISON:  Chest x-ray dated February 18, 2018. FINDINGS: Twelve rib-bearing thoracic vertebral bodies. The upper thoracic spine is not well visualized on the lateral view. No acute fracture or subluxation. Vertebral body heights are preserved. Alignment is normal. Intervertebral disc spaces are maintained. Osteopenia. IMPRESSION: Negative. Electronically Signed   By: Titus Dubin M.D.   On: 09/21/2018 10:20   Dg Lumbar Spine Complete  Result Date: 09/21/2018 CLINICAL DATA:  Back pain after fall 2 days ago. EXAM: LUMBAR SPINE - COMPLETE 4+ VIEW COMPARISON:  None. FINDINGS: Five lumbar type vertebral bodies. No acute fracture or subluxation. Vertebral body heights are preserved. Alignment is normal. Intervertebral disc spaces are maintained. Osteopenia. Aortoiliac atherosclerosis. IMPRESSION: 1.  No acute osseous abnormality. Electronically Signed   By: Titus Dubin M.D.   On: 09/21/2018 10:18   Ct Pelvis Wo Contrast  Result Date: 09/21/2018 CLINICAL DATA:  [place] yesterday with pelvic and hip pain on the left. Abnormal radiographs. EXAM: CT PELVIS WITHOUT CONTRAST TECHNIQUE: Multidetector CT imaging of the pelvis was performed following the standard protocol without  intravenous contrast. COMPARISON:  Radiography same day FINDINGS: Urinary Tract:  Normal Bowel:  Normal except for ordinary diverticulosis. Vascular/Lymphatic: Atherosclerotic change without aneurysm or acute hemorrhage. Reproductive:  Normal Other:  No free fluid or air. Musculoskeletal: Nondisplaced fracture of the left sacral ala. Nondisplaced fractures of the left superior pubic ramus and pubic symphysis region. No evidence of femur fracture. No right-sided pelvic injury seen. IMPRESSION: Nondisplaced fracture of the left sacral ala. Nondisplaced fractures of the left superior pubic ramus and symphyseal region. No significant soft tissue hematoma. Electronically Signed   By: Nelson Chimes M.D.   On: 09/21/2018 12:27   Dg Shoulder Left  Result Date: 09/21/2018 CLINICAL DATA:  Fall 2 days ago. EXAM: LEFT SHOULDER - 2+ VIEW COMPARISON:  None. FINDINGS: There is no evidence of fracture or dislocation. There is no evidence of arthropathy or other focal bone abnormality. Osteopenia. Soft tissues are unremarkable. IMPRESSION: Negative. Electronically Signed   By: Titus Dubin M.D.   On: 09/21/2018 10:13  Dg Hip Unilat With Pelvis 2-3 Views Left  Result Date: 09/21/2018 CLINICAL DATA:  Fall 2 days ago. EXAM: DG HIP (WITH OR WITHOUT PELVIS) 2-3V LEFT COMPARISON:  None. FINDINGS: Slight irregularity of the left superior pubic ramus, suspicious for nondisplaced fracture. No additional fracture. No dislocation. The pubic symphysis and sacroiliac joints are intact. The hip joint spaces are preserved. Osteopenia. Soft tissues are unremarkable. IMPRESSION: 1. Suspected nondisplaced fracture of the left superior pubic ramus. Electronically Signed   By: Titus Dubin M.D.   On: 09/21/2018 10:17        Scheduled Meds: . aspirin EC  81 mg Oral Daily  . enoxaparin (LOVENOX) injection  30 mg Subcutaneous Q24H  . insulin aspart  0-5 Units Subcutaneous QHS  . insulin aspart  0-9 Units Subcutaneous TID WC  .  loratadine  10 mg Oral Daily  . losartan  25 mg Oral Daily   Continuous Infusions: . sodium chloride 125 mL/hr at 09/22/18 1342     LOS: 0 days    Time spent: 35 minutes.     Hosie Poisson, MD [facility] Pager [phone]   If 7PM-7AM, please contact night-coverage [email or web address] Password [facility] 09/22/2018, 4:32 PM

## 2018-09-22 NOTE — Progress Notes (Signed)
Dr Karleen Hampshire paged, as patient is reporting itching and rash on back since morphine. Donne Hazel, RN

## 2018-09-22 NOTE — Progress Notes (Signed)
Temperature 101.7. Tylenol given and Dr Karleen Hampshire notified via page. Donne Hazel, RN

## 2018-09-22 NOTE — Care Management Obs Status (Signed)
[place] NOTIFICATION   Patient Details  Name: RYLI STANDLEE MRN: [id number] Date of Birth: 07-Jul-1946   Medicare Observation Status Notification Given:  Yes    Dessa Phi, RN 09/22/2018, 3:00 PM

## 2018-09-22 NOTE — Evaluation (Signed)
Occupational Therapy Evaluation Patient Details Name: [name] MRN: [id number] DOB: 19-Aug-1945 Today's Date: 09/22/2018    History of Present Illness Patient is a 73 y/o female presenting to the ED on 09/21/2018 with primary complaints of fall with associated pain. CT scan noted multiple pelvic fractures, all of which were nonoperable, per chart review. 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    Clinical Impression   This 73 y/o female presents with the above. At baseline pt is mod independent with functional mobility using SPC, receives supervision from her daughter for completing ADL. Pt presenting with significant pain in LLE and L shoulder, impacting her functional performance. She currently requires two person assist for safe completion of stand pivot transfers using RW, requiring increased time and effort to perform. Pt currently requires minA for UB ADL, maxA (+2) for LB ADL. She will benefit from continued acute OT services and recommend follow up therapy services in SNF setting after discharge to maximize her safety and independence with ADL and mobility. Will follow.     Follow Up Recommendations  SNF;Supervision/Assistance - 24 hour    Equipment Recommendations  None recommended by OT(pt's DME needs are met)           Precautions / Restrictions Precautions Precautions: Fall Restrictions Weight Bearing Restrictions: Yes LLE Weight Bearing: Weight bearing as tolerated      Mobility Bed Mobility Overal bed mobility: Needs Assistance Bed Mobility: Supine to Sit     Supine to sit: Max assist;+2 for physical assistance;+2 for safety/equipment     General bed mobility comments: pt able to partially roll onto side prior to transition to sitting; assist for LEs over EOB and to elevate trunk  Transfers Overall transfer  level: Needs assistance Equipment used: Rolling walker (2 wheeled) Transfers: Sit to/from [facility] Sit to Stand: Max assist;+2 physical assistance;+2 safety/equipment;From elevated surface Stand pivot transfers: Max assist;+2 physical assistance;+2 safety/equipment       General transfer comment: pt significantly limited due to pain; requires assist to rise and steady at RW, pt only able to take very small shuffling steps towards recliner, requires assist/facilitation to fully advance LLE and assist to move RW, pt with difficulty maintaing grip on RW with LUE    Balance Overall balance assessment: Needs assistance Sitting-balance support: Feet supported;Single extremity supported Sitting balance-Leahy Scale: Fair     Standing balance support: Bilateral upper extremity supported Standing balance-Leahy Scale: Poor                             ADL either performed or assessed with clinical judgement   ADL Overall ADL's : Needs assistance/impaired Eating/Feeding: Modified independent;Sitting   Grooming: Set up;Sitting   Upper Body Bathing: Minimal assistance;Sitting   Lower Body Bathing: +2 for physical assistance;+2 for safety/equipment;Sit to/from stand;Maximal assistance   Upper Body Dressing : Moderate assistance;Sitting   Lower Body Dressing: Maximal assistance;+2 for physical assistance;+2 for safety/equipment;Sit to/from stand Lower Body Dressing Details (indicate cue type and reason): assist to doff/don socks; pt with significant pain reaching towards LEs Toilet Transfer: Maximal assistance;+2 for physical assistance;+2 for safety/equipment;RW Toilet Transfer Details (indicate cue type and reason): simulated via transfer to [place] and Hygiene: Maximal assistance;+2 for physical assistance;+2 for safety/equipment;Sit to/from stand Toileting - Clothing Manipulation Details (indicate cue type and reason):  pt  incontinent of urine upon standing from EOB, requires assist for peri-care      Functional mobility during ADLs: Maximal assistance;+2 for physical assistance;+2 for safety/equipment;Rolling walker General ADL Comments: pt limited due to pain, decreased standing balance     Vision         Perception     Praxis      Pertinent Vitals/Pain Pain Assessment: Faces Faces Pain Scale: Hurts whole lot Pain Location: LLE, L shoulder Pain Descriptors / Indicators: Discomfort;Grimacing;Guarding;Sharp Pain Intervention(s): Limited activity within patient's tolerance;Monitored during session;Repositioned     Hand Dominance     Extremity/Trunk Assessment Upper Extremity Assessment Upper Extremity Assessment: LUE deficits/detail LUE Deficits / Details: pt with baseline L side weakness due to hx of CVA, tends to maintain hand in flexion but is able to open/close without assist, limited in LUE shoulder ROM due to significant pain with attempts at shoulder flexion LUE: Unable to fully assess due to pain   Lower Extremity Assessment Lower Extremity Assessment: Defer to PT evaluation       Communication     Cognition Arousal/Alertness: Awake/alert Behavior During Therapy: WFL for tasks assessed/performed Overall Cognitive Status: Within Functional Limits for tasks assessed                                     General Comments       Exercises     Shoulder Instructions      Home Living Family/patient expects to be discharged to:: Private residence Living Arrangements: Spouse/significant other Available Help at Discharge: Family;Available 24 hours/day Type of Home: House Home Access: Stairs to enter [employer] of Steps: 3 Entrance Stairs-Rails: Right;Left Home Layout: One level     Bathroom Shower/Tub: Occupational psychologist: Handicapped height     Home Equipment: Grab bars - tub/shower;Walker - 2 wheels;Cane - single point;Bedside  commode;Shower seat;Other (comment)   Additional Comments: bed rail      Prior Functioning/Environment Level of Independence: Needs assistance  Gait / Transfers Assistance Needed: using SPC ADL's / Homemaking Assistance Needed: dauther supervises for ADL completion, for safety            OT Problem List: Decreased strength;Decreased range of motion;Decreased activity tolerance;Impaired balance (sitting and/or standing);Decreased knowledge of use of DME or AE;Impaired UE functional use;Pain      OT Treatment/Interventions: Self-care/ADL training;Therapeutic exercise;DME and/or AE instruction;Therapeutic activities;Patient/family education;Balance training    OT Goals(Current goals can be found in the care plan section) Acute Rehab OT Goals Patient Stated Goal: return home, regain independence, less pain OT Goal Formulation: With patient Time For Goal Achievement: 10/06/18 Potential to Achieve Goals: Good  OT Frequency: Min 2X/week   Barriers to D/C:            Co-evaluation PT/OT/SLP Co-Evaluation/Treatment: Yes Reason for Co-Treatment: For patient/therapist safety;To address functional/ADL transfers   OT goals addressed during session: ADL's and self-care;Proper use of Adaptive equipment and DME      AM-PAC OT "6 Clicks" Daily Activity     Outcome Measure Help from another person eating meals?: None Help from another person taking care of personal grooming?: A Little Help from another person toileting, which includes using toliet, bedpan, or urinal?: A Lot Help from another person bathing (including washing, rinsing, drying)?: A Lot Help from another person to put on and taking off regular upper body clothing?: A Little Help from another person  to put on and taking off regular lower body clothing?: A Lot 6 Click Score: 16   End of Session Equipment Utilized During Treatment: Gait belt;Rolling walker Nurse Communication: Mobility status  Activity Tolerance: Patient  tolerated treatment well;Patient limited by pain Patient left: in chair;with call bell/phone within reach;with nursing/sitter in room;with family/visitor present(NT present)  OT Visit Diagnosis: Other abnormalities of gait and mobility (R26.89);Pain Pain - Right/Left: Left Pain - part of body: Shoulder;Hip                Time: 9371-6967 OT Time Calculation (min): 37 min Charges:  OT General Charges $OT Visit: 1 Visit OT Evaluation $OT Eval Moderate Complexity: [place], OT [employer] Pager [phone] Office [place] 09/22/2018, 1:47 PM

## 2018-09-22 NOTE — Consult Note (Addendum)
ORTHOPAEDIC CONSULTATION  REQUESTING PHYSICIAN: Hosie Poisson, MD  Chief Complaint: Pelvis pain  HPI: [name] is a 73 y.o. female who complains of acute severe pain over the pelvis secondary to a fall which happened last Saturday.  The tried not coming to the emergency room, but she was unable to ambulate and ultimately came in for pain control.  She has had a previous stroke about 6 months ago, and has some left-sided weakness as well, she apparently was trying to kill a spider and lost her balance.  Pain is worse with movement, better with pain medications including morphine.  Past Medical History:  Diagnosis Date  . Allergy   . Arthritis   . Cataract   . Diabetes mellitus without complication ([place])   . Hypertension   . Macular degeneration syndrome    with edema---being treated.    Past Surgical History:  Procedure Laterality Date  . callous removal  Left 2017   located on 1st digit on L foot 2/2 diabetes  . EYE SURGERY     Social History   Socioeconomic History  . Marital status: Married    Spouse name: Rennis Harding  . Number of children: 2  . 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 Highest education level: High school graduate  Occupational History  . Not on file  Social Needs  . Financial resource strain: Somewhat hard  . Food insecurity:    Worry: Never true    Inability: Never true  . Transportation needs:    Medical: No    Non-medical: No  Tobacco Use  . Smoking status: Never Smoker  . Smokeless tobacco: Never Used  Substance and Sexual Activity  . Alcohol use: No  . Drug use: No  . Sexual activity: Not Currently  Lifestyle  . Physical activity:    Days per week: 0 days    Minutes per session: 0 min  . Stress: Not at all  Relationships  . Social connections:    Talks on phone: More than three times a week    Gets together: Twice a week    Attends religious service: Never    Active member of club or  organization: No    Attends meetings of clubs or organizations: Never    Relationship status: Married  Other Topics Concern  . Not on file  Social History Narrative  . Not on file   Family History  Problem Relation Age of Onset  . Kidney disease Mother   . Congestive Heart Failure Father   . COPD Father   . COPD Brother   . Diabetes Maternal Aunt    Allergies  Allergen Reactions  . Clonidine Anaphylaxis, Swelling and Other (See Comments)    Tongue swelling  . Norvasc [Amlodipine Besylate] Swelling    Edema and TONGUE swelling   . Lisinopril Cough  . Amlodipine Anxiety, Nausea Only and Swelling  . Codeine Itching and Rash     Positive ROS: All other systems have been reviewed and were otherwise negative with the exception of those mentioned in the HPI and as above.  Physical Exam: General: Alert, no acute distress Cardiovascular: No pedal edema Respiratory: No cyanosis, no use of accessory musculature GI: No organomegaly, abdomen is soft and non-tender Skin: No lesions in the area of chief complaint Neurologic: Sensation intact distally Psychiatric: Patient is competent for consent with normal mood and affect Lymphatic: No axillary or cervical lymphadenopathy  MUSCULOSKELETAL: Pelvis feels stable,  no other pain to any bony prominence on either upper or lower extremities.  EHL and FHL are intact.  Positive pain to palpation around pelvic ring.  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  This is an acute significant injury that is going to impact her ability to be independent.  I am not sure what her ultimate disposition is going to be, whether  she will need skilled nursing facility or not, but I will plan to see her in approximately 2 weeks.  I have reviewed the injury, the x-rays, and the plan with the family.  Anticipate 2 to 3 months for sufficient recovery, even 6 months for full recovery, but she does have significant risk factors as indicated above.  Okay for anticoagulation per primary team.  Johnny Bridge, MD Cell [phone]   09/22/2018 10:04 AM

## 2018-09-22 NOTE — Evaluation (Signed)
Physical Therapy Evaluation Patient Details Name: ASTI MACKLEY MRN: [id number] DOB: 1945/10/23 Today's Date: 09/22/2018   History of Present Illness  Patient is a 73 y/o female presenting to the ED on 09/21/2018 with primary complaints of fall with associated pain. CT scan noted multiple pelvic fractures, all of which were nonoperable, per chart review. 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     Clinical Impression  Ms. Hubbs admitted with the above listed diagnosis. Patient reports Mod I with mobility prior to admission with good return of strength from prior CVA. Patient today requiring Max A+2 for all bed level mobility and transfers with limited weight bearing requiring physical assist from PT for LE advancement. Patient also with significant pain at L shoulder impacting functional mobility. Due to current functional status and need for increased physical assist, will recommend SNF level therapies at discharge. PT to continue to follow.      Follow Up Recommendations SNF;Supervision/Assistance - 24 hour    Equipment Recommendations  Other (comment)(defer)    Recommendations for Other Services       Precautions / Restrictions Precautions Precautions: Fall Restrictions Weight Bearing Restrictions: Yes LLE Weight Bearing: Weight bearing as tolerated      Mobility  Bed Mobility Overal bed mobility: Needs Assistance Bed Mobility: Supine to Sit     Supine to sit: Max assist;+2 for physical assistance;+2 for safety/equipment     General bed mobility comments: pt able to partially roll onto side prior to transition to sitting; assist for LEs over EOB and to elevate trunk  Transfers Overall transfer level: Needs assistance Equipment used: Rolling walker (2 wheeled) Transfers: Sit to/from [facility] Sit to  Stand: Max assist;+2 physical assistance;+2 safety/equipment;From elevated surface Stand pivot transfers: Max assist;+2 physical assistance;+2 safety/equipment       General transfer comment: pt significantly limited due to pain; requires assist to rise and steady at RW, pt only able to take very small shuffling steps towards recliner, requires assist/facilitation to fully advance LLE and assist to move RW, pt with difficulty maintaing grip on RW with LUE  Ambulation/Gait             General Gait Details: unable  Stairs            Wheelchair Mobility    Modified Rankin (Stroke Patients Only)       Balance Overall balance assessment: Needs assistance Sitting-balance support: Feet supported;Single extremity supported Sitting balance-Leahy Scale: Fair     Standing balance support: Bilateral upper extremity supported Standing balance-Leahy Scale: Poor                               Pertinent Vitals/Pain Pain Assessment: Faces Faces Pain Scale: Hurts whole lot Pain Location: LLE, L shoulder Pain Descriptors / Indicators: Discomfort;Grimacing;Guarding;Sharp Pain Intervention(s): Limited activity within patient's tolerance;Monitored during session;Repositioned    Home Living Family/patient expects to be discharged to:: Private residence Living Arrangements: Spouse/significant other Available Help at Discharge: Family;Available 24 hours/day Type of Home: House Home Access: Stairs to enter Entrance Stairs-Rails: Psychiatric nurse of Steps: 3 Home Layout: One level Home Equipment: Grab bars - tub/shower;Walker - 2 wheels;Cane - single point;Bedside commode;Shower seat;Other (comment) Additional Comments: bed rail    Prior Function Level of Independence: Needs assistance   Gait / Transfers Assistance Needed: using  SPC  ADL's / Homemaking Assistance Needed: dauther supervises for ADL completion, for safety        Hand Dominance         Extremity/Trunk Assessment   Upper Extremity Assessment Upper Extremity Assessment: Defer to OT evaluation    Lower Extremity Assessment Lower Extremity Assessment: Generalized weakness;LLE deficits/detail;RLE deficits/detail RLE Deficits / Details: due to pain unable to fully lift LE from bed LLE Deficits / Details: residual weakness from prior CVA - unable to tolerate mcuh weight bearing; unable to lift against gravity       Communication      Cognition Arousal/Alertness: Awake/alert Behavior During Therapy: WFL for tasks assessed/performed Overall Cognitive Status: Within Functional Limits for tasks assessed                                        General Comments General comments (skin integrity, edema, etc.): daughter present - unsure if she is fully aware of extent of patients need for physical assist and increased level of care required    Exercises     Assessment/Plan    PT Assessment Patient needs continued PT services  PT Problem List Decreased strength;Decreased range of motion;Decreased activity tolerance;Decreased balance;Decreased knowledge of use of DME;Decreased safety awareness;Decreased mobility;Decreased knowledge of precautions;Pain       PT Treatment Interventions DME instruction;Gait training;Functional mobility training;Therapeutic activities;Therapeutic exercise;Balance training;Patient/family education;Stair training    PT Goals (Current goals can be found in the Care Plan section)  Acute Rehab PT Goals Patient Stated Goal: return home, regain independence, less pain PT Goal Formulation: With patient Time For Goal Achievement: 10/06/18 Potential to Achieve Goals: Fair    Frequency Min 3X/week   Barriers to discharge        Co-evaluation PT/OT/SLP Co-Evaluation/Treatment: Yes Reason for Co-Treatment: Complexity of the patient's impairments (multi-system involvement);For patient/therapist safety;To address functional/ADL  transfers PT goals addressed during session: Mobility/safety with mobility;Balance;Proper use of DME         AM-PAC PT "6 Clicks" Mobility  Outcome Measure Help needed turning from your back to your side while in a flat bed without using bedrails?: Total Help needed moving from lying on your back to sitting on the side of a flat bed without using bedrails?: Total Help needed moving to and from a bed to a chair (including a wheelchair)?: Total Help needed standing up from a chair using your arms (e.g., wheelchair or bedside chair)?: Total Help needed to walk in hospital room?: Total Help needed climbing 3-5 steps with a railing? : Total 6 Click Score: 6    End of Session Equipment Utilized During Treatment: Gait belt Activity Tolerance: Patient tolerated treatment well;Patient limited by pain Patient left: in chair;with call bell/phone within reach;with chair alarm set;with family/visitor present Nurse Communication: Mobility status PT Visit Diagnosis: Unsteadiness on feet (R26.81);Other abnormalities of gait and mobility (R26.89);Muscle weakness (generalized) (M62.81);History of falling (Z91.81);Pain    Time: 4627-0350 PT Time Calculation (min) (ACUTE ONLY): 38 min   Charges:   PT Evaluation $PT Eval Moderate Complexity: 1 Mod          Lanney Gins, PT, DPT Supplemental Physical Therapist 09/22/18 3:10 PM Pager: [phone] Office: [phone]

## 2018-09-22 NOTE — Progress Notes (Addendum)
Occupational Therapy Treatment Patient Details Name: [name] MRN: [id number] DOB: 1946/04/18 Today's Date: 09/22/2018    History of present illness Patient is a 73 y/o female presenting to the ED on 09/21/2018 with primary complaints of fall with associated pain. CT scan noted multiple pelvic fractures, all of which were nonoperable, per chart review. 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    OT comments  Pt seen for additional treatment session as pt with increased pain levels this PM and RN request assist to ensure safe transfer back to bed while limiting increase in pain. Given pt's pain levels, utilized Denna Haggard for transfer completion; pt requiring maxA+2 for sit<>stand at [facility] and for repositioning in bed, pt using RUE to assist with pulling up at [facility], limited use of LUE as she continues to experience increased pain in L shoulder with any ROM. Despite pain pt is motivated to regain her PLOF and willing to do what she can to work with therapy. Feel POC remains appropriate at this time. Will continue to follow acutely to progress pt towards established OT goals.   Follow Up Recommendations  SNF;Supervision/Assistance - 24 hour    Equipment Recommendations  None recommended by OT(DME needs are met)          Precautions / Restrictions Precautions Precautions: Fall Restrictions Weight Bearing Restrictions: Yes LLE Weight Bearing: Weight bearing as tolerated       Mobility Bed Mobility Overal bed mobility: Needs Assistance Bed Mobility: Sit to Supine     Supine to sit: Max assist;+2 for physical assistance;+2 for safety/equipment Sit to supine: Max assist;Total assist;+2 for physical assistance;+2 for safety/equipment   General bed mobility comments: assist to guide trunk as well as for LEs onto EOB; pt positioned on  R side for comfort end of session  Transfers Overall transfer level: Needs assistance Equipment used: Ambulation equipment used Transfers: Sit to/from Stand Sit to Stand: Max assist;+2 physical assistance;+2 safety/equipment;From elevated surface Stand pivot transfers: Max assist;+2 physical assistance;+2 safety/equipment       General transfer comment: pt continues to have limitations due to pain; requires heavy two person assist for sit<>stand at [place] (use of Stedy as pt not able to tolerate taking steps this PM), pt using RUE on Stedy to assist with transitions but unable to use LUE to assist due to pain    Balance Overall balance assessment: Needs assistance Sitting-balance support: Feet supported;Single extremity supported Sitting balance-Leahy Scale: Fair     Standing balance support: Bilateral upper extremity supported Standing balance-Leahy Scale: Poor                             ADL either performed or assessed with clinical judgement   ADL Overall ADL's : Needs assistance/impaired                                     Functional mobility during ADLs: Maximal assistance;+2 for physical assistance;+2 for safety/equipment General ADL Comments: RN requesting assist for transferring pt back to bed as pt with increased pain levels this session and decreased tolerance to mobility; use of Stedy for safe transfer completion with pt requiring two person assist for transfer completion     Vision  Perception     Praxis      Cognition Arousal/Alertness: Awake/alert Behavior During Therapy: WFL for tasks assessed/performed Overall Cognitive Status: Within Functional Limits for tasks assessed                                          Exercises     Shoulder Instructions       General Comments daughter present - unsure if she is fully aware of extent of patients need for physical assist and increased level of care required     Pertinent Vitals/ Pain       Pain Assessment: Faces Faces Pain Scale: Hurts whole lot Pain Location: LLE, L shoulder Pain Descriptors / Indicators: Discomfort;Grimacing;Guarding;Sharp Pain Intervention(s): Limited activity within patient's tolerance;Monitored during session;Repositioned;Premedicated before session  Home Living Family/patient expects to be discharged to:: Private residence Living Arrangements: Spouse/significant other Available Help at Discharge: Family;Available 24 hours/day Type of Home: House Home Access: Stairs to enter [employer] of Steps: 3 Entrance Stairs-Rails: Right;Left Home Layout: One level     Bathroom Shower/Tub: Occupational psychologist: Handicapped height     Home Equipment: Grab bars - tub/shower;Walker - 2 wheels;Cane - single point;Bedside commode;Shower seat;Other (comment)   Additional Comments: bed rail      Prior Functioning/Environment Level of Independence: Needs assistance  Gait / Transfers Assistance Needed: using SPC ADL's / Homemaking Assistance Needed: dauther supervises for ADL completion, for safety       Frequency  Min 2X/week        Progress Toward Goals  OT Goals(current goals can now be found in the care plan section)  Progress towards OT goals: Progressing toward goals  Acute Rehab OT Goals Patient Stated Goal: return home, regain independence, less pain OT Goal Formulation: With patient Time For Goal Achievement: 10/06/18 Potential to Achieve Goals: Good ADL Goals Pt Will Perform Grooming: with modified independence;sitting Pt Will Perform Upper Body Dressing: with set-up;sitting Pt Will Perform Lower Body Dressing: with min assist;sit to/from stand Pt Will Transfer to Toilet: with min assist;ambulating;stand pivot transfer Pt Will Perform Toileting - Clothing Manipulation and hygiene: with min assist;sit to/from stand Additional ADL Goal #1: Pt will perform bed mobility with minA  as precursor to EOB/OOB ADL.  Plan Discharge plan remains appropriate    Co-evaluation      Reason for Co-Treatment: Complexity of the patient's impairments (multi-system involvement);For patient/therapist safety;To address functional/ADL transfers PT goals addressed during session: Mobility/safety with mobility;Balance;Proper use of DME        AM-PAC OT "6 Clicks" Daily Activity     Outcome Measure   Help from another person eating meals?: None Help from another person taking care of personal grooming?: A Little Help from another person toileting, which includes using toliet, bedpan, or urinal?: A Lot Help from another person bathing (including washing, rinsing, drying)?: A Lot Help from another person to put on and taking off regular upper body clothing?: A Lot Help from another person to put on and taking off regular lower body clothing?: Total 6 Click Score: 14    End of Session Equipment Utilized During Treatment: Gait belt(stedy)  OT Visit Diagnosis: Other abnormalities of gait and mobility (R26.89);Pain Pain - Right/Left: Left Pain - part of body: Shoulder;Hip   Activity Tolerance Patient limited by pain;Patient tolerated treatment well   Patient Left in bed;with call bell/phone within reach;with family/visitor  present   Nurse Communication Mobility status        Time: 8978-4784 OT Time Calculation (min): 19 min  Charges: OT General Charges $OT Visit: 1 Visit OT Treatments $Self Care/Home Management : 8-22 mins  Lou Cal, OT Supplemental Rehabilitation Services Pager [phone] Office [phone]    Raymondo Band 09/22/2018, 3:32 PM

## 2018-09-23 DIAGNOSIS — Z885 Allergy status to narcotic agent status: Secondary | ICD-10-CM | POA: Diagnosis not present

## 2018-09-23 DIAGNOSIS — L89611 Pressure ulcer of right heel, stage 1: Secondary | ICD-10-CM | POA: Diagnosis not present

## 2018-09-23 DIAGNOSIS — W19XXXA Unspecified fall, initial encounter: Secondary | ICD-10-CM | POA: Diagnosis not present

## 2018-09-23 DIAGNOSIS — R5081 Fever presenting with conditions classified elsewhere: Secondary | ICD-10-CM

## 2018-09-23 DIAGNOSIS — Z7982 Long term (current) use of aspirin: Secondary | ICD-10-CM | POA: Diagnosis not present

## 2018-09-23 DIAGNOSIS — Z888 Allergy status to other drugs, medicaments and biological substances status: Secondary | ICD-10-CM | POA: Diagnosis not present

## 2018-09-23 DIAGNOSIS — Z833 Family history of diabetes mellitus: Secondary | ICD-10-CM | POA: Diagnosis not present

## 2018-09-23 DIAGNOSIS — N183 Chronic kidney disease, stage 3 (moderate): Secondary | ICD-10-CM | POA: Diagnosis present

## 2018-09-23 DIAGNOSIS — J40 Bronchitis, not specified as acute or chronic: Secondary | ICD-10-CM | POA: Diagnosis present

## 2018-09-23 DIAGNOSIS — S3282XA Multiple fractures of pelvis without disruption of pelvic ring, initial encounter for closed fracture: Secondary | ICD-10-CM | POA: Diagnosis present

## 2018-09-23 DIAGNOSIS — R509 Fever, unspecified: Secondary | ICD-10-CM | POA: Diagnosis not present

## 2018-09-23 DIAGNOSIS — E663 Overweight: Secondary | ICD-10-CM | POA: Diagnosis present

## 2018-09-23 DIAGNOSIS — Z8249 Family history of ischemic heart disease and other diseases of the circulatory system: Secondary | ICD-10-CM | POA: Diagnosis not present

## 2018-09-23 DIAGNOSIS — I129 Hypertensive chronic kidney disease with stage 1 through stage 4 chronic kidney disease, or unspecified chronic kidney disease: Secondary | ICD-10-CM | POA: Diagnosis present

## 2018-09-23 DIAGNOSIS — M25552 Pain in left hip: Secondary | ICD-10-CM | POA: Diagnosis present

## 2018-09-23 DIAGNOSIS — E11311 Type 2 diabetes mellitus with unspecified diabetic retinopathy with macular edema: Secondary | ICD-10-CM | POA: Diagnosis present

## 2018-09-23 DIAGNOSIS — W010XXA Fall on same level from slipping, tripping and stumbling without subsequent striking against object, initial encounter: Secondary | ICD-10-CM | POA: Diagnosis present

## 2018-09-23 DIAGNOSIS — Z79899 Other long term (current) drug therapy: Secondary | ICD-10-CM | POA: Diagnosis not present

## 2018-09-23 DIAGNOSIS — K5903 Drug induced constipation: Secondary | ICD-10-CM | POA: Diagnosis not present

## 2018-09-23 DIAGNOSIS — S32110A Nondisplaced Zone I fracture of sacrum, initial encounter for closed fracture: Secondary | ICD-10-CM | POA: Diagnosis present

## 2018-09-23 DIAGNOSIS — I160 Hypertensive urgency: Secondary | ICD-10-CM | POA: Diagnosis present

## 2018-09-23 DIAGNOSIS — Z841 Family history of disorders of kidney and ureter: Secondary | ICD-10-CM | POA: Diagnosis not present

## 2018-09-23 DIAGNOSIS — L89621 Pressure ulcer of left heel, stage 1: Secondary | ICD-10-CM | POA: Diagnosis not present

## 2018-09-23 DIAGNOSIS — I69354 Hemiplegia and hemiparesis following cerebral infarction affecting left non-dominant side: Secondary | ICD-10-CM | POA: Diagnosis not present

## 2018-09-23 DIAGNOSIS — Z825 Family history of asthma and other chronic lower respiratory diseases: Secondary | ICD-10-CM | POA: Diagnosis not present

## 2018-09-23 DIAGNOSIS — E1122 Type 2 diabetes mellitus with diabetic chronic kidney disease: Secondary | ICD-10-CM | POA: Diagnosis present

## 2018-09-23 DIAGNOSIS — T40605A Adverse effect of unspecified narcotics, initial encounter: Secondary | ICD-10-CM | POA: Diagnosis not present

## 2018-09-23 DIAGNOSIS — R21 Rash and other nonspecific skin eruption: Secondary | ICD-10-CM | POA: Diagnosis not present

## 2018-09-23 DIAGNOSIS — Z7951 Long term (current) use of inhaled steroids: Secondary | ICD-10-CM | POA: Diagnosis not present

## 2018-09-23 DIAGNOSIS — S329XXD Fracture of unspecified parts of lumbosacral spine and pelvis, subsequent encounter for fracture with routine healing: Secondary | ICD-10-CM | POA: Diagnosis not present

## 2018-09-23 DIAGNOSIS — E119 Type 2 diabetes mellitus without complications: Secondary | ICD-10-CM | POA: Diagnosis not present

## 2018-09-23 LAB — BASIC METABOLIC PANEL
ANION GAP: 9 (ref 5–15)
BUN: 22 mg/dL (ref 8–23)
CHLORIDE: 110 mmol/L (ref 98–111)
CO2: 18 mmol/L — ABNORMAL LOW (ref 22–32)
Calcium: 8.5 mg/dL — ABNORMAL LOW (ref 8.9–10.3)
Creatinine, Ser: 1.76 mg/dL — ABNORMAL HIGH (ref 0.44–1.00)
GFR calc Af Amer: 33 mL/min — ABNORMAL LOW (ref >60)
GFR calc non Af Amer: 28 mL/min — ABNORMAL LOW (ref >60)
Glucose, Bld: 75 mg/dL (ref 70–99)
Potassium: 4 mmol/L (ref 3.5–5.1)
Sodium: 137 mmol/L (ref 135–145)

## 2018-09-23 LAB — URINALYSIS, ROUTINE W REFLEX MICROSCOPIC
Bacteria, UA: NONE SEEN
Bilirubin Urine: NEGATIVE
Glucose, UA: NEGATIVE mg/dL
Ketones, ur: NEGATIVE mg/dL
LEUKOCYTE UA: NEGATIVE
Nitrite: NEGATIVE
Protein, ur: 30 mg/dL — AB
SPECIFIC GRAVITY, URINE: 1.005 (ref 1.005–1.030)
pH: 6 (ref 5.0–8.0)

## 2018-09-23 LAB — CBC
HEMATOCRIT: 30.9 % — AB (ref 36.0–46.0)
Hemoglobin: 9.9 g/dL — ABNORMAL LOW (ref 12.0–15.0)
MCH: 27.8 pg (ref 26.0–34.0)
MCHC: 32 g/dL (ref 30.0–36.0)
MCV: 86.8 fL (ref 80.0–100.0)
Platelets: 214 10*3/uL (ref 150–400)
RBC: 3.56 MIL/uL — ABNORMAL LOW (ref 3.87–5.11)
RDW: 13.4 % (ref 11.5–15.5)
WBC: 8 10*3/uL (ref 4.0–10.5)
nRBC: 0 % (ref 0.0–0.2)

## 2018-09-23 LAB — GLUCOSE, CAPILLARY
Glucose-Capillary: 75 mg/dL (ref 70–99)
Glucose-Capillary: 94 mg/dL (ref 70–99)
Glucose-Capillary: 98 mg/dL (ref 70–99)
Glucose-Capillary: 116 mg/dL — ABNORMAL HIGH (ref 70–99)

## 2018-09-23 MED ORDER — HYDROXYZINE HCL 10 MG PO TABS
10.0000 mg | ORAL_TABLET | Freq: Three times a day (TID) | ORAL | Status: DC | PRN
  Administered 2018-09-23 (×2): 10 mg via ORAL
  Filled 2018-09-23 (×3): qty 1

## 2018-09-23 MED ORDER — LIDOCAINE 5 % EX PTCH
1.0000 | MEDICATED_PATCH | CUTANEOUS | Status: DC
  Administered 2018-09-23 – 2018-09-24 (×2): 1 via TRANSDERMAL
  Filled 2018-09-23 (×5): qty 1

## 2018-09-23 MED ORDER — DIPHENHYDRAMINE-ZINC ACETATE 2-0.1 % EX CREA: TOPICAL_CREAM | Freq: Two times a day (BID) | CUTANEOUS | Status: DC | PRN

## 2018-09-23 MED ORDER — OXYCODONE HCL 5 MG PO TABS
5.0000 mg | ORAL_TABLET | ORAL | Status: DC | PRN
  Administered 2018-09-23 – 2018-09-25 (×6): 5 mg via ORAL
  Filled 2018-09-23 (×6): qty 1

## 2018-09-23 MED ORDER — DOXYCYCLINE HYCLATE 100 MG PO TABS
100.0000 mg | ORAL_TABLET | Freq: Two times a day (BID) | ORAL | Status: DC
  Administered 2018-09-23 – 2018-09-27 (×9): 100 mg via ORAL
  Filled 2018-09-23 (×9): qty 1

## 2018-09-23 NOTE — NC FL2 (Signed)
[place] MEDICAID FL2 LEVEL OF CARE SCREENING TOOL     IDENTIFICATION  Patient Name: [name] Birthdate: 12/22/1945 Sex: female Admission Date (Current Location): 09/21/2018  [facility] and [place] Number:  Herbalist and Address:  [facility],  [place] [address] [place]      Provider Number: [id number]  Attending Physician Name and Address:  Caren Griffins, MD  Relative Name and Phone Number:  Blair Heys [phone])    Current Level of Care: Hospital Recommended Level of Care: [place] Prior Approval Number:    Date Approved/Denied:   PASRR Number: [id number]  Discharge Plan: SNF    Current Diagnoses: Patient Active Problem List   Diagnosis Date Noted  . Pelvic fracture ([place]) 09/21/2018  . Allergic reaction caused by a drug 05/06/2018  . Bilateral leg edema 04/13/2018  . HLD (hyperlipidemia) 03/23/2018  . Mood swings 03/23/2018  . Chronic kidney disease (CKD), stage III (moderate) (HCC)   . Embolic stroke ([place]) 62/83/1517  . Left hemiparesis ([place])   . Diabetes mellitus type 2 in nonobese (HCC)   . Acute ischemic stroke ([place])   . Left arm weakness   . Acute CVA (cerebrovascular accident) ([place]) 02/24/2018  . 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 Overweight (BMI 25.0-29.9) 02/24/2018  . CKD (chronic kidney disease), stage III ([place]) 02/24/2018  . Diabetes mellitus without complication ([place]) 61/60/7371  . HTN (hypertension) 09/02/2017  . Knee pain, bilateral 09/02/2017    Orientation RESPIRATION BLADDER Height & Weight     Self, Time, Situation, Place  Normal Continent Weight: 68 kg Height:  5\' 2"  (157.5 cm)  BEHAVIORAL SYMPTOMS/MOOD NEUROLOGICAL BOWEL NUTRITION STATUS      Continent Diet(Heart Healthy)  AMBULATORY STATUS COMMUNICATION OF NEEDS Skin   Extensive Assist Verbally Normal                       Personal Care Assistance Level of Assistance  Bathing,  Feeding, Dressing Bathing Assistance: Limited assistance Feeding assistance: Limited assistance Dressing Assistance: Limited assistance     Functional Limitations Info  Sight, Hearing, Speech Sight Info: Adequate Hearing Info: Adequate Speech Info: Adequate    SPECIAL CARE FACTORS FREQUENCY  PT (By licensed PT), OT (By licensed OT)     PT Frequency: 5x week OT Frequency: 5x week            Contractures Contractures Info: Not present    Additional Factors Info  Code Status, Allergies, Insulin Sliding Scale Code Status Info: Full code Allergies Info: Clonidine, Norvasc Amlodipine Besylate, Lisinopril, Amlodipine, Codeine   Insulin Sliding Scale Info: Sliding Scale Insulin;See medication list       Current Medications (09/23/2018):  This is the current hospital active medication list Current Facility-Administered Medications  Medication Dose Route Frequency Provider Last Rate Last Dose  . acetaminophen (TYLENOL) tablet 650 mg  650 mg Oral Q6H PRN Annita Brod, MD   650 mg at 09/23/18 0626   Or  . acetaminophen (TYLENOL) suppository 650 mg  650 mg Rectal Q6H PRN Annita Brod, MD      . aspirin EC tablet 81 mg  81 mg Oral Daily Annita Brod, MD   81 mg at 09/23/18 0940  . doxycycline (VIBRA-TABS) tablet 100 mg  100 mg Oral Q12H Caren Griffins, MD   100 mg at 09/23/18 0940  . enoxaparin (LOVENOX) injection 30 mg  30 mg Subcutaneous Q24H  Annita Brod, MD   30 mg at 09/22/18 2214  . hydrALAZINE (APRESOLINE) injection 10 mg  10 mg Intravenous Q4H PRN Hosie Poisson, MD      . hydrOXYzine (ATARAX/VISTARIL) tablet 10 mg  10 mg Oral TID PRN Caren Griffins, MD   10 mg at 09/23/18 0951  . insulin aspart (novoLOG) injection 0-5 Units  0-5 Units Subcutaneous QHS Gevena Barre K, MD      . insulin aspart (novoLOG) injection 0-9 Units  0-9 Units Subcutaneous TID WC Annita Brod, MD   Stopped at 09/21/18 1840  . loratadine (CLARITIN) tablet 10 mg  10 mg  Oral Daily Annita Brod, MD   10 mg at 09/23/18 0940  . losartan (COZAAR) tablet 25 mg  25 mg Oral Daily Annita Brod, MD   25 mg at 09/23/18 0940  . morphine 2 MG/ML injection 2 mg  2 mg Intravenous Q3H PRN Annita Brod, MD   2 mg at 09/23/18 0950  . ondansetron (ZOFRAN) tablet 4 mg  4 mg Oral Q6H PRN Annita Brod, MD       Or  . ondansetron [facility]) injection 4 mg  4 mg Intravenous Q6H PRN Annita Brod, MD      . oxyCODONE (Oxy IR/ROXICODONE) immediate release tablet 5 mg  5 mg Oral Q4H PRN Annita Brod, MD   5 mg at 09/23/18 1751     Discharge Medications: Please see discharge summary for a list of discharge medications.  Relevant Imaging Results:  Relevant Lab Results:   Additional Information [id number]  Dessa Phi, RN

## 2018-09-23 NOTE — Progress Notes (Signed)
OT Cancellation Note  Patient Details Name: [name] MRN: [id number] DOB: [birth year]   Cancelled Treatment:    Reason Eval/Treat Not Completed: Other (comment)  Pt had just finished with PT. Plan to see pt for OT in the morning  Kari Baars, [place] [phone] Office- [phone], Thereasa Parkin 09/23/2018, 4:06 PM

## 2018-09-23 NOTE — Plan of Care (Signed)
Patient lying in bed this morning. Pain currently controlled but tends to flare quickly depending on movement. Worked with PT yesterday, Will continue to monitor.

## 2018-09-23 NOTE — Progress Notes (Signed)
Physical Therapy Treatment Patient Details Name: [name] MRN: [id number] DOB: 20-Nov-1945 Today's Date: 09/23/2018    History of Present Illness Patient is a 73 y/o female presenting to the ED on 09/21/2018 with primary complaints of fall with associated pain. CT scan noted multiple pelvic fractures, all of which were nonoperable, per chart review. 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     PT Comments    Pt agreeable to mobilize as tolerated.  Pt remains limited by pain on left side especially pelvis and UE.   Pt assisted to sitting EOB for at least 10 minutes and then returned to supine.  Pt repositioned to comfort.  Continue to recommend SNF upon d/c.    Follow Up Recommendations  SNF;Supervision/Assistance - 24 hour     Equipment Recommendations  Wheelchair cushion (measurements PT);Wheelchair (measurements PT)(if home)    Recommendations for Other Services       Precautions / Restrictions Precautions Precautions: Fall Precaution Comments: L UE hemiplegia Restrictions LLE Weight Bearing: Weight bearing as tolerated    Mobility  Bed Mobility Overal bed mobility: Needs Assistance Bed Mobility: Supine to Sit;Sit to Supine     Supine to sit: Total assist;+2 for physical assistance Sit to supine: Total assist;+2 for physical assistance   General bed mobility comments: requires significant total assist due to pain, pt cries out in pain so assisted quickly with bed pads to sitting as well as for return to bed; once pt settled pain eases  Transfers Overall transfer level: Needs assistance Equipment used: Rolling walker (2 wheeled) Transfers: Sit to/from Stand Sit to Stand: Max assist;+2 safety/equipment         General transfer comment: pt assisted with placing L UE on RW, assist to rise and steady; pt able to  stand approx 45 seconds  Ambulation/Gait             General Gait Details: unable   Stairs             Wheelchair Mobility    Modified Rankin (Stroke Patients Only)       Balance                                            Cognition Arousal/Alertness: Awake/alert Behavior During Therapy: WFL for tasks assessed/performed Overall Cognitive Status: Within Functional Limits for tasks assessed                                        Exercises General Exercises - Lower Extremity Short Arc Quad: AROM;10 reps;Seated;Both    General Comments        Pertinent Vitals/Pain Pain Score: 10-Worst pain ever Pain Location: Lpelvis, L shoulder Pain Descriptors / Indicators: Discomfort;Grimacing;Guarding;Sharp Pain Intervention(s): Premedicated before session;Monitored during session;Repositioned    Home Living                      Prior Function            PT Goals (current goals can now be found in the care plan section) Progress towards PT goals: Progressing toward goals    Frequency    Min 3X/week  PT Plan Current plan remains appropriate    Co-evaluation              AM-PAC PT "6 Clicks" Mobility   Outcome Measure  Help needed turning from your back to your side while in a flat bed without using bedrails?: Total Help needed moving from lying on your back to sitting on the side of a flat bed without using bedrails?: Total Help needed moving to and from a bed to a chair (including a wheelchair)?: Total Help needed standing up from a chair using your arms (e.g., wheelchair or bedside chair)?: Total Help needed to walk in hospital room?: Total Help needed climbing 3-5 steps with a railing? : Total 6 Click Score: 6    End of Session Equipment Utilized During Treatment: Gait belt Activity Tolerance: Patient limited by pain Patient left: with family/visitor present;in bed;with bed alarm set;with call  bell/phone within reach Nurse Communication: Mobility status PT Visit Diagnosis: Other abnormalities of gait and mobility (R26.89);Muscle weakness (generalized) (M62.81)     Time: 1457-1530 PT Time Calculation (min) (ACUTE ONLY): 33 min  Charges:  $Therapeutic Activity: 23-37 mins                    Carmelia Bake, PT, DPT Acute Rehabilitation Services Office: [phone] Pager: [phone]  Trena Platt 09/23/2018, 4:13 PM

## 2018-09-23 NOTE — TOC Progression Note (Signed)
Transition of Care [facility]) - Progression Note    Patient Details  Name: [name] MRN: [id number] Date of Birth: October 04, 1945  Transition of Care [facility]) CM/SW Contact  Nkenge Sonntag, Juliann Pulse, RN Phone Number: 09/23/2018, 11:55 AM  Clinical Narrative: Provided dtr with current bed offers-[facility]-dtr still deciding on taking patient home w/HHC vs SNF-Will await responses of [place] agencies.      Expected Discharge Plan: [place] Barriers to Discharge: No Barriers Identified  Expected Discharge Plan and Services Expected Discharge Plan: [place] Discharge Planning Services: CM Consult   Living arrangements for the past 2 months: Single Family Home Expected Discharge Date: (unknown)                         Social Determinants of Health (SDOH) Interventions    Readmission Risk Interventions  No flowsheet data found.

## 2018-09-23 NOTE — TOC Progression Note (Signed)
Transition of Care [facility]) - Progression Note    Patient Details  Name: CHELCIE ESTORGA MRN: [id number] Date of Birth: December 27, 1945  Transition of Care [facility]) CM/SW Contact  Gracelin Weisberg, Juliann Pulse, RN Phone Number: 09/23/2018, 11:34 AM  Clinical Narrative:     CM faxed patient info out. Awaiting SNF offers to present to daughter.   Expected Discharge Plan: [place] Barriers to Discharge: No Barriers Identified  Expected Discharge Plan and Services Expected Discharge Plan: [place] Discharge Planning Services: CM Consult   Living arrangements for the past 2 months: Single Family Home Expected Discharge Date: (unknown)                         Social Determinants of Health (SDOH) Interventions    Readmission Risk Interventions  No flowsheet data found.

## 2018-09-23 NOTE — NC FL2 (Signed)
[place] MEDICAID FL2 LEVEL OF CARE SCREENING TOOL     IDENTIFICATION  Patient Name: [name] Birthdate: [birth year] Sex: female Admission Date (Current Location): 09/21/2018  [facility] and [place] Number:  Herbalist and Address:  [facility],  [place] [address] [place]      Provider Number: [id number]  Attending Physician Name and Address:  Caren Griffins, MD  Relative Name and Phone Number:  Blair Heys [phone])    Current Level of Care: Hospital Recommended Level of Care: [place] Prior Approval Number:    Date Approved/Denied:   PASRR Number: [id number]  Discharge Plan: SNF    Current Diagnoses: Patient Active Problem List   Diagnosis Date Noted  . Pelvic fracture ([place]) 09/21/2018  . Allergic reaction caused by a drug 05/06/2018  . Bilateral leg edema 04/13/2018  . HLD (hyperlipidemia) 03/23/2018  . Mood swings 03/23/2018  . Chronic kidney disease (CKD), stage III (moderate) (HCC)   . Embolic stroke ([place]) 01/05/1600  . Left hemiparesis ([place])   . Diabetes mellitus type 2 in nonobese (HCC)   . Acute ischemic stroke ([place])   . Left arm weakness   . Acute CVA (cerebrovascular accident) ([place]) 02/24/2018  . 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 Overweight (BMI 25.0-29.9) 02/24/2018  . CKD (chronic kidney disease), stage III ([place]) 02/24/2018  . Diabetes mellitus without complication ([place]) 09/32/3557  . HTN (hypertension) 09/02/2017  . Knee pain, bilateral 09/02/2017    Orientation RESPIRATION BLADDER Height & Weight     Self, Time, Situation, Place  Normal Continent Weight: 68 kg Height:  5\' 2"  (157.5 cm)  BEHAVIORAL SYMPTOMS/MOOD NEUROLOGICAL BOWEL NUTRITION STATUS      Continent Diet(Heart Healthy)  AMBULATORY STATUS COMMUNICATION OF NEEDS Skin   Extensive Assist Verbally Normal                       Personal Care Assistance Level of Assistance  Bathing,  Feeding, Dressing Bathing Assistance: Limited assistance Feeding assistance: Limited assistance Dressing Assistance: Limited assistance     Functional Limitations Info  Sight, Hearing, Speech Sight Info: Adequate Hearing Info: Adequate Speech Info: Adequate    SPECIAL CARE FACTORS FREQUENCY  PT (By licensed PT), OT (By licensed OT)     PT Frequency: 5x week OT Frequency: 5x week            Contractures Contractures Info: Not present    Additional Factors Info  Code Status, Allergies, Insulin Sliding Scale Code Status Info: Full code Allergies Info: Clonidine, Norvasc Amlodipine Besylate, Lisinopril, Amlodipine, Codeine   Insulin Sliding Scale Info: Sliding Scale Insulin;See medication list       Current Medications (09/23/2018):  This is the current hospital active medication list Current Facility-Administered Medications  Medication Dose Route Frequency Provider Last Rate Last Dose  . acetaminophen (TYLENOL) tablet 650 mg  650 mg Oral Q6H PRN Annita Brod, MD   650 mg at 09/23/18 3220   Or  . acetaminophen (TYLENOL) suppository 650 mg  650 mg Rectal Q6H PRN Annita Brod, MD      . aspirin EC tablet 81 mg  81 mg Oral Daily Annita Brod, MD   81 mg at 09/23/18 0940  . doxycycline (VIBRA-TABS) tablet 100 mg  100 mg Oral Q12H Caren Griffins, MD   100 mg at 09/23/18 0940  . enoxaparin (LOVENOX) injection 30 mg  30 mg Subcutaneous Q24H  Annita Brod, MD   30 mg at 09/22/18 2214  . hydrALAZINE (APRESOLINE) injection 10 mg  10 mg Intravenous Q4H PRN Hosie Poisson, MD      . hydrOXYzine (ATARAX/VISTARIL) tablet 10 mg  10 mg Oral TID PRN Caren Griffins, MD   10 mg at 09/23/18 0951  . insulin aspart (novoLOG) injection 0-5 Units  0-5 Units Subcutaneous QHS Gevena Barre K, MD      . insulin aspart (novoLOG) injection 0-9 Units  0-9 Units Subcutaneous TID WC Annita Brod, MD   Stopped at 09/21/18 1840  . loratadine (CLARITIN) tablet 10 mg  10 mg  Oral Daily Annita Brod, MD   10 mg at 09/23/18 0940  . losartan (COZAAR) tablet 25 mg  25 mg Oral Daily Annita Brod, MD   25 mg at 09/23/18 0940  . morphine 2 MG/ML injection 2 mg  2 mg Intravenous Q3H PRN Annita Brod, MD   2 mg at 09/23/18 0950  . ondansetron (ZOFRAN) tablet 4 mg  4 mg Oral Q6H PRN Annita Brod, MD       Or  . ondansetron [facility]) injection 4 mg  4 mg Intravenous Q6H PRN Annita Brod, MD      . oxyCODONE (Oxy IR/ROXICODONE) immediate release tablet 5 mg  5 mg Oral Q4H PRN Annita Brod, MD   5 mg at 09/23/18 6945     Discharge Medications: Please see discharge summary for a list of discharge medications.  Relevant Imaging Results:  Relevant Lab Results:   Additional Information [id number]  Dessa Phi, RN

## 2018-09-23 NOTE — Progress Notes (Signed)
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 , CT scan noted multiple pelvic fractures, all of which were non operable. Orthopedics consulted and recommendations were for conservative management with weightbearing as tolerated, physical therapy, mobilization.  Subjective: She does not have much pain at rest but experienced excruciating pain every time she tries to move.  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  She continues to require intravenous morphine thus warranting further inpatient hospitalization, discussed with patient today and will attempt oral oxycodone and try to minimize intravenous morphine in preparation for discharge when pain is controlled. -Physical therapy recommended SNF, consulted social worker  Active Problems Chronic kidney disease stage III -1.6-1.7, currently at baseline  Diabetes mellitus -Continue sliding scale insulin  Fever due to bronchitis -Patient with URI type symptoms over the past week, febrile yesterday, concern atelectasis versus bronchitis as per chest x-ray.  Favored to do doxycycline for a few days  History of CVA with left-sided weakness -Stable  Hypertension -Continue losartan   Scheduled Meds: . aspirin EC  81 mg Oral Daily  . doxycycline  100 mg Oral Q12H  .  enoxaparin (LOVENOX) injection  30 mg Subcutaneous Q24H  . insulin aspart  0-5 Units Subcutaneous QHS  . insulin aspart  0-9 Units Subcutaneous TID WC  . loratadine  10 mg Oral Daily  . losartan  25 mg Oral Daily   Continuous Infusions: PRN Meds:.acetaminophen **OR** acetaminophen, hydrALAZINE, hydrOXYzine, morphine injection, ondansetron **OR** ondansetron (ZOFRAN) IV, oxyCODONE  DVT prophylaxis: Lovenox Code Status: Full code Family Communication: daughter present at bedside  Disposition Plan: SNF when ready  Consultants:   Orthopedic surgery  Procedures:   None   Antimicrobials:  Doxycycline 3/18 >>  Objective: Vitals:   09/22/18 1731 09/22/18 1814 09/22/18 2153 09/23/18 0513  BP: (!) 161/75  (!) 149/58 (!) 159/62  Pulse: 89  75 82  Resp:   15 15  Temp: (!) 101.7 F (38.7 C) 99.3 F (37.4 C) 98.7 F (37.1 C) 99 F (37.2 C)  TempSrc: Oral  Oral Oral  SpO2:   90% 94%  Weight:      Height:        Intake/Output Summary (Last 24 hours) at 09/23/2018 1031 Last data filed at 09/23/2018 1000 Gross per 24 hour  Intake 2291.2 ml  Output 750 ml  Net 1541.2 ml   Filed Weights   09/21/18 0847  Weight: 68 kg    Examination:  Constitutional: NAD Eyes: PERRL, lids and conjunctivae normal ENMT: Mucous membranes are moist.  Neck: normal, supple Respiratory: clear to auscultation bilaterally, no wheezing, no crackles. Normal respiratory effort.  Cardiovascular: Regular rate and rhythm, no murmurs / rubs / gallops. No LE edema. Abdomen: no tenderness. Bowel sounds positive.  Musculoskeletal: no clubbing / cyanosis. Skin: no rashes seen Neurologic: No new deficits   Data Reviewed: I have independently reviewed following labs and imaging studies  CBC: Recent Labs  Lab 09/21/18 1310 09/22/18 0437 09/23/18 0415  WBC 10.3 8.8 8.0  NEUTROABS 7.3  --   --   HGB 12.4 11.7* 9.9*  HCT 37.8 36.7 30.9*  MCV 84.9 87.0 86.8  PLT 267 206 875   Basic Metabolic  Panel: Recent Labs  Lab 09/21/18 1310 09/22/18 0437 09/23/18 0415  NA 140 140 137  K 3.8 4.2 4.0  CL 107 110 110  CO2 24 23 18*  GLUCOSE 118* 109* 75  BUN 21 22 22   CREATININE 1.58* 1.73* 1.76*  CALCIUM 9.0 8.5* 8.5*   GFR: Estimated Creatinine Clearance: 25.8 mL/min (A) (by C-G formula based on SCr of 1.76 mg/dL (H)). Liver Function Tests: No results for input(s): AST, ALT, ALKPHOS, BILITOT, PROT, ALBUMIN in the last 168 hours. No results for input(s): LIPASE, AMYLASE in the last 168 hours. No results for input(s): AMMONIA in the last 168 hours. Coagulation Profile: No results for input(s): INR, PROTIME in the last 168 hours. Cardiac Enzymes: No results for input(s): CKTOTAL, CKMB, CKMBINDEX, TROPONINI in the last 168 hours. BNP (last 3 results) No results for input(s): PROBNP in the last 8760 hours. HbA1C: Recent Labs    09/22/18 0437  HGBA1C 6.2*   CBG: Recent Labs  Lab 09/22/18 0713 09/22/18 1156 09/22/18 1632 09/22/18 2222 09/23/18 0811  GLUCAP 107* 109* 106* 81 75   Lipid Profile: No results for input(s): CHOL, HDL, LDLCALC, TRIG, CHOLHDL, LDLDIRECT in the last 72 hours. Thyroid Function Tests: No results for input(s): TSH, T4TOTAL, FREET4, T3FREE, THYROIDAB in the last 72 hours. Anemia Panel: No results for input(s): VITAMINB12, FOLATE, FERRITIN, TIBC, IRON, RETICCTPCT in the last 72 hours. Urine analysis:    Component Value Date/Time   COLORURINE STRAW (A) 09/23/2018 0411   APPEARANCEUR CLEAR 09/23/2018 0411   LABSPEC 1.005 09/23/2018 0411   PHURINE 6.0 09/23/2018 0411   GLUCOSEU NEGATIVE 09/23/2018 0411   HGBUR SMALL (A) 09/23/2018 0411   BILIRUBINUR NEGATIVE 09/23/2018 0411   KETONESUR NEGATIVE 09/23/2018 0411   PROTEINUR 30 (A) 09/23/2018 0411   NITRITE NEGATIVE 09/23/2018 0411   LEUKOCYTESUR NEGATIVE 09/23/2018 0411   Sepsis Labs: Invalid input(s): PROCALCITONIN, LACTICIDVEN  Recent Results (from the past 240 hour(s))  Culture, blood  (Routine X 2) w Reflex to ID Panel     Status: None (Preliminary result)   Collection Time: 09/22/18  6:43 PM  Result Value Ref Range Status   Specimen Description   Final    BLOOD LEFT ANTECUBITAL Performed at [facility] [address] [place], [place] [address]    Special Requests   Final    BOTTLES DRAWN AEROBIC AND ANAEROBIC Blood Culture adequate volume Performed at [facility] [address] [place], [place] [address]    Culture   Final    NO GROWTH < 24 HOURS Performed at [facility] [address] [place], [place] [address]    Report Status PENDING  Incomplete  Culture, blood (Routine X 2) w Reflex to ID Panel     Status: None (Preliminary result)   Collection Time: 09/22/18  6:47 PM  Result Value Ref Range Status   Specimen Description   Final    BLOOD RIGHT HAND Performed at [facility] [address], [place], [place] [address]    Special Requests   Final    BOTTLES DRAWN AEROBIC AND ANAEROBIC Blood Culture adequate volume Performed at [facility] [address]  [place], [place] [address]    Culture   Final    NO GROWTH < 24 HOURS Performed at [facility], [place] [address] [place], [place] [address]    Report Status PENDING  Incomplete      Radiology Studies: Dg Chest 2 View  Result Date: 09/22/2018 CLINICAL DATA:  Per order- fever Pt HX: non smoker, HTN, DM EXAM: CHEST - 2 VIEW COMPARISON:  02/18/2018 FINDINGS: Cardiac silhouette is normal in size. No mediastinal or hilar masses. No evidence of adenopathy. Irregularly thickened bronchovascular interstitial markings are mildly increased compared to the most recent prior study. There are no focal areas of lung consolidation. No pleural effusion or pneumothorax. Skeletal structures are demineralized but intact. IMPRESSION: 1. Irregularly thickened bronchovascular interstitial markings, which have mildly increased when  compared to the most recent prior study. In the setting of fever, active interstitial infection or inflammation superimposed on chronic changes is suspected. No focal lung consolidation to suggest lobar pneumonia. Electronically Signed   By: Lajean Manes M.D.   On: 09/22/2018 19:07   Ct Pelvis Wo Contrast  Result Date: 09/21/2018 CLINICAL DATA:  [place] yesterday with pelvic and hip pain on the left. Abnormal radiographs. EXAM: CT PELVIS WITHOUT CONTRAST TECHNIQUE: Multidetector CT imaging of the pelvis was performed following the standard protocol without intravenous contrast. COMPARISON:  Radiography same day FINDINGS: Urinary Tract:  Normal Bowel:  Normal except for ordinary diverticulosis. Vascular/Lymphatic: Atherosclerotic change without aneurysm or acute hemorrhage. Reproductive:  Normal Other:  No free fluid or air. Musculoskeletal: Nondisplaced fracture of the left sacral ala. Nondisplaced fractures of the left superior pubic ramus and pubic symphysis region. No evidence of femur fracture. No right-sided pelvic injury seen. IMPRESSION: Nondisplaced fracture of the left sacral ala. Nondisplaced fractures of the left superior pubic ramus and symphyseal region. No significant soft tissue hematoma. Electronically Signed   By: Nelson Chimes M.D.   On: 09/21/2018 12:27    Marzetta Board, MD, PhD [facility]  Contact via  [email or web address]  [place] P: [phone]  F: [phone]

## 2018-09-24 DIAGNOSIS — E13311 Other specified diabetes mellitus with unspecified diabetic retinopathy with macular edema: Secondary | ICD-10-CM

## 2018-09-24 LAB — GLUCOSE, CAPILLARY
GLUCOSE-CAPILLARY: 123 mg/dL — AB (ref 70–99)
Glucose-Capillary: 83 mg/dL (ref 70–99)
Glucose-Capillary: 98 mg/dL (ref 70–99)
Glucose-Capillary: 118 mg/dL — ABNORMAL HIGH (ref 70–99)

## 2018-09-24 MED ORDER — POLYETHYLENE GLYCOL 3350 17 G PO PACK
17.0000 g | PACK | Freq: Every day | ORAL | Status: DC
  Administered 2018-09-24 – 2018-09-25 (×2): 17 g via ORAL
  Filled 2018-09-24 (×3): qty 1

## 2018-09-24 MED ORDER — LIDOCAINE 5 % EX PTCH: 1.0000 | MEDICATED_PATCH | CUTANEOUS | Status: DC

## 2018-09-24 MED ORDER — SENNOSIDES-DOCUSATE SODIUM 8.6-50 MG PO TABS
1.0000 | ORAL_TABLET | Freq: Two times a day (BID) | ORAL | Status: DC
  Administered 2018-09-24 – 2018-09-25 (×3): 1 via ORAL
  Filled 2018-09-24 (×3): qty 1

## 2018-09-24 MED ORDER — METHOCARBAMOL 500 MG PO TABS
500.0000 mg | ORAL_TABLET | Freq: Three times a day (TID) | ORAL | Status: DC | PRN
  Administered 2018-09-24 – 2018-09-25 (×2): 500 mg via ORAL
  Administered 2018-09-26: 125 mg via ORAL
  Administered 2018-09-26: 500 mg via ORAL
  Filled 2018-09-24 (×4): qty 1

## 2018-09-24 NOTE — Progress Notes (Addendum)
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 , CT scan noted multiple pelvic fractures, all of which were non operable. Orthopedics consulted and recommendations were for conservative management with weightbearing as tolerated, physical therapy, mobilization.  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  She continues to require intravenous morphine, increased oxycodone frequency to every 3 hours and discussed with nursing staff to attempt to avoid IV dosing if possible -Physical therapy recommended SNF, consulted social worker, okay to be discharged once pain better controlled  Active Problems Chronic kidney disease stage III -1.6-1.7, currently at baseline  Left shoulder pain -X-ray negative for fractures, placed lidocaine patch  Constipation -No bowel movement for the last 5 days, she is on narcotics, provide aggressive bowel regimen  Diabetes mellitus -Continue sliding scale insulin  Fever due to bronchitis -Patient with URI type symptoms over the past week, febrile yesterday, concern atelectasis versus bronchitis as per chest x-ray.  Favored to do doxycycline for a few days -Fever curve  improved  History of CVA with left-sided weakness -Stable  Rash lower back -Minimal, improved today, will continue to monitor.  Hypertension -Continue losartan, blood pressure on the high side likely due to pain   Scheduled Meds: . aspirin EC  81 mg Oral Daily  . doxycycline  100 mg Oral Q12H  . enoxaparin (LOVENOX) injection  30 mg Subcutaneous Q24H  . insulin aspart  0-5 Units Subcutaneous QHS  . insulin aspart  0-9 Units Subcutaneous TID WC  . lidocaine  1 patch Transdermal Q24H  . loratadine  10 mg Oral Daily  . losartan  25 mg Oral Daily  . polyethylene glycol  17 g Oral Daily  . senna-docusate  1 tablet Oral BID   Continuous Infusions: PRN Meds:.acetaminophen **OR** acetaminophen, hydrALAZINE, hydrOXYzine, morphine injection, ondansetron **OR** ondansetron (ZOFRAN) IV, oxyCODONE  DVT prophylaxis: Lovenox Code Status: Full code Family Communication: daughter present at bedside  Disposition Plan: SNF when ready  Consultants:   Orthopedic surgery  Procedures:   None   Antimicrobials:  Doxycycline 3/18 >>  Objective: Vitals:   09/22/18 2153 09/23/18 0513 09/23/18 1343 09/23/18 2136  BP: (!) 149/58 (!) 159/62 (!) 176/63 (!) 183/69  Pulse: 75 82 76 80  Resp: 15 15 18 18   Temp: 98.7 F (37.1 C) 99 F (37.2 C) 99.1 F (37.3 C) 98.9 F (37.2 C)  TempSrc: Oral Oral Oral Oral  SpO2: 90% 94% 91% 92%  Weight:      Height:        Intake/Output Summary (Last 24 hours) at 09/24/2018 1118 Last data filed at 09/24/2018 1000 Gross per 24 hour  Intake 600 ml  Output 1600 ml  Net -1000 ml   Filed Weights   09/21/18 0847  Weight: 68 kg    Examination:  Constitutional: NAD Eyes: no scleral icterus  ENMT: mmm Respiratory: CTA biL, no wheezing, no crackles  Cardiovascular: RRR, no murmurs Abdomen: Soft, mildly tender to palpation, positive bowel sounds Musculoskeletal: no clubbing / cyanosis. Skin: To barely noticeable bumps mid lower back, no surrounding  erythema, no vesicles Neurologic: No deficits   Data Reviewed: I have independently reviewed following labs and imaging studies   CBC: Recent Labs  Lab 09/21/18 1310 09/22/18 0437 09/23/18 0415  WBC 10.3 8.8 8.0  NEUTROABS 7.3  --   --   HGB 12.4 11.7* 9.9*  HCT 37.8 36.7 30.9*  MCV 84.9 87.0 86.8  PLT 267 206 259   Basic Metabolic Panel: Recent Labs  Lab 09/21/18 1310 09/22/18 0437 09/23/18 0415  NA 140 140 137  K 3.8 4.2 4.0  CL 107 110 110  CO2 24 23 18*  GLUCOSE 118* 109* 75  BUN 21 22 22   CREATININE 1.58* 1.73* 1.76*  CALCIUM 9.0 8.5* 8.5*   GFR: Estimated Creatinine Clearance: 25.8 mL/min (A) (by C-G formula based on SCr of 1.76 mg/dL (H)). Liver Function Tests: No results for input(s): AST, ALT, ALKPHOS, BILITOT, PROT, ALBUMIN in the last 168 hours. No results for input(s): LIPASE, AMYLASE in the last 168 hours. No results for input(s): AMMONIA in the last 168 hours. Coagulation Profile: No results for input(s): INR, PROTIME in the last 168 hours. Cardiac Enzymes: No results for input(s): CKTOTAL, CKMB, CKMBINDEX, TROPONINI in the last 168 hours. BNP (last 3 results) No results for input(s): PROBNP in the last 8760 hours. HbA1C: Recent Labs    09/22/18 0437  HGBA1C 6.2*   CBG: Recent Labs  Lab 09/23/18 0811 09/23/18 1157 09/23/18 1644 09/23/18 2213 09/24/18 0754  GLUCAP 75 94 98 116* 83   Lipid Profile: No results for input(s): CHOL, HDL, LDLCALC, TRIG, CHOLHDL, LDLDIRECT in the last 72 hours. Thyroid Function Tests: No results for input(s): TSH, T4TOTAL, FREET4, T3FREE, THYROIDAB in the last 72 hours. Anemia Panel: No results for input(s): VITAMINB12, FOLATE, FERRITIN, TIBC, IRON, RETICCTPCT in the last 72 hours. Urine analysis:    Component Value Date/Time   COLORURINE STRAW (A) 09/23/2018 0411   APPEARANCEUR CLEAR 09/23/2018 0411   LABSPEC 1.005 09/23/2018 0411   PHURINE 6.0 09/23/2018 0411   GLUCOSEU NEGATIVE 09/23/2018 0411    HGBUR SMALL (A) 09/23/2018 0411   BILIRUBINUR NEGATIVE 09/23/2018 0411   KETONESUR NEGATIVE 09/23/2018 0411   PROTEINUR 30 (A) 09/23/2018 0411   NITRITE NEGATIVE 09/23/2018 0411   LEUKOCYTESUR NEGATIVE 09/23/2018 0411   Sepsis Labs: Invalid input(s): PROCALCITONIN, LACTICIDVEN  Recent Results (from the past 240 hour(s))  Culture, blood (Routine X 2) w Reflex to ID Panel     Status: None (Preliminary result)   Collection Time: 09/22/18  6:43 PM  Result Value Ref Range Status   Specimen Description   Final    BLOOD LEFT ANTECUBITAL Performed at [facility] [address] [place], [place] [address]    Special Requests   Final    BOTTLES DRAWN AEROBIC AND ANAEROBIC Blood Culture adequate volume Performed at [facility] [address], [place], [place] [address]    Culture   Final    NO GROWTH < 24 HOURS Performed at [facility] [address], [place], [place] [address]    Report Status PENDING  Incomplete  Culture, blood (Routine X 2) w Reflex to ID Panel     Status: None (Preliminary result)   Collection Time:  09/22/18  6:47 PM  Result Value Ref Range Status   Specimen Description   Final    BLOOD RIGHT HAND Performed at [facility] [address] [place], [place] [address]    Special Requests   Final    BOTTLES DRAWN AEROBIC AND ANAEROBIC Blood Culture adequate volume Performed at [facility] [address], [place], [place] [address]    Culture   Final    NO GROWTH < 24 HOURS Performed at [facility] [address] [place], [place] [address]    Report Status PENDING  Incomplete      Radiology Studies: Dg Chest 2 View  Result Date: 09/22/2018 CLINICAL DATA:  Per order- fever Pt HX: non smoker, HTN, DM EXAM: CHEST - 2 VIEW COMPARISON:  02/18/2018 FINDINGS: Cardiac silhouette is normal in size. No mediastinal or hilar masses. No evidence of adenopathy. Irregularly  thickened bronchovascular interstitial markings are mildly increased compared to the most recent prior study. There are no focal areas of lung consolidation. No pleural effusion or pneumothorax. Skeletal structures are demineralized but intact. IMPRESSION: 1. Irregularly thickened bronchovascular interstitial markings, which have mildly increased when compared to the most recent prior study. In the setting of fever, active interstitial infection or inflammation superimposed on chronic changes is suspected. No focal lung consolidation to suggest lobar pneumonia. Electronically Signed   By: Lajean Manes M.D.   On: 09/22/2018 19:07    Marzetta Board, MD, PhD [facility]  Contact via  [email or web address]  [place] P: [phone]  F: [phone]

## 2018-09-24 NOTE — Progress Notes (Signed)
Occupational Therapy Treatment Patient Details Name: [name] MRN: [id number] DOB: 1946/05/12 Today's Date: 09/24/2018    History of present illness Patient is a 73 y/o female presenting to the ED on 09/21/2018 with primary complaints of fall with associated pain. CT scan noted multiple pelvic fractures, all of which were nonoperable, per chart review. 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    OT comments  Pt able to get to [facility] with OT!  Encouraged deep breathing during activity  Follow Up Recommendations  CIR    Equipment Recommendations  3 in 1 bedside commode    Recommendations for Other Services      Precautions / Restrictions Precautions Precautions: Fall Precaution Comments: L UE hemiplegia Restrictions LLE Weight Bearing: Weight bearing as tolerated       Mobility Bed Mobility Overal bed mobility: Needs Assistance Bed Mobility: Supine to Sit     Supine to sit: +2 for physical assistance;Max assist        Transfers Overall transfer level: Needs assistance Equipment used: Rolling walker (2 wheeled) Transfers: Sit to/from [employer] Sit to Stand: Max assist   Squat pivot transfers: Max assist          Balance Overall balance assessment: Needs assistance Sitting-balance support: Feet supported;Single extremity supported Sitting balance-Leahy Scale: Fair Sitting balance - Comments: VC to sit upright and maintain balance   Standing balance support: Bilateral upper extremity supported Standing balance-Leahy Scale: Poor                             ADL either performed or assessed with clinical judgement   ADL Overall ADL's : Needs assistance/impaired                         Toilet Transfer: Maximal assistance;BSC;Squat-pivot;Cueing for safety;Cueing for  sequencing Toilet Transfer Details (indicate cue type and reason): going to right           General ADL Comments: pt left sitting on the 3 n 1 with daugther present.  RN aware. PT plans to help pt to chair     Vision Patient Visual Report: No change from baseline            Cognition Arousal/Alertness: Awake/alert Behavior During Therapy: WFL for tasks assessed/performed Overall Cognitive Status: Within Functional Limits for tasks assessed                                                     Pertinent Vitals/ Pain       Pain Score: 6  Faces Pain Scale: Hurts even more Pain Location: Lpelvis, L shoulder Pain Descriptors / Indicators: Discomfort;Grimacing;Guarding;Sharp Pain Intervention(s): Limited activity within patient's tolerance;Repositioned;Monitored during session         Frequency  Min 2X/week        Progress Toward Goals  OT Goals(current goals can now be found in the care plan section)  Progress towards OT goals: Progressing toward goals  Acute Rehab OT Goals Patient Stated Goal: return home, regain independence, less pain OT Goal Formulation: With patient Time For Goal Achievement: 10/06/18 Potential to Achieve Goals: Good  Plan Discharge plan  remains appropriate    Co-evaluation                 AM-PAC OT "6 Clicks" Daily Activity     Outcome Measure   Help from another person eating meals?: A Little Help from another person taking care of personal grooming?: A Little Help from another person toileting, which includes using toliet, bedpan, or urinal?: Total Help from another person bathing (including washing, rinsing, drying)?: A Lot Help from another person to put on and taking off regular upper body clothing?: A Lot Help from another person to put on and taking off regular lower body clothing?: Total 6 Click Score: 12    End of Session Equipment Utilized During Treatment: Gait belt  OT Visit Diagnosis: Other  abnormalities of gait and mobility (R26.89);Pain;Hemiplegia and hemiparesis;History of falling (Z91.81);Repeated falls (R29.6) Hemiplegia - Right/Left: Left Hemiplegia - dominant/non-dominant: Non-Dominant Hemiplegia - caused by: Cerebral infarction Pain - Right/Left: Left Pain - part of body: Shoulder   Activity Tolerance Patient tolerated treatment well   Patient Left with call bell/phone within reach;with family/visitor present;Other (comment)(on BSC)   Nurse Communication Mobility status        Time: 1415-1445 OT Time Calculation (min): 30 min  Charges: OT General Charges $OT Visit: 1 Visit OT Treatments $Self Care/Home Management : 23-37 mins $Neuromuscular Re-education: 38-52 mins  Kari Baars, [place] [phone] Office- [phone], [place] 09/24/2018, 3:24 PM

## 2018-09-24 NOTE — Progress Notes (Signed)
Physical Therapy Treatment Patient Details Name: [name] MRN: [id number] DOB: Nov 25, 1945 Today's Date: 09/24/2018    History of Present Illness Patient is a 73 y/o female presenting to the ED on 09/21/2018 with primary complaints of fall with associated pain. CT scan noted multiple pelvic fractures, all of which were nonoperable, per chart review. 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     PT Comments    Pt up on [facility] however unsuccessful with BM (up to [facility] with OT).  Pt assisted back to bed and repositioned to comfort in supine.  Daughter reports plan to take pt home.  Pt will need increased assist and care if d/c home.  Follow Up Recommendations  SNF;Supervision/Assistance - 24 hour(family wants home)     Equipment Recommendations  Wheelchair cushion (measurements PT);Wheelchair (measurements PT);Hospital bed    Recommendations for Other Services       Precautions / Restrictions Precautions Precautions: Fall Precaution Comments: L UE hemiplegia Restrictions LLE Weight Bearing: Weight bearing as tolerated    Mobility  Bed Mobility Overal bed mobility: Needs Assistance Bed Mobility: Sit to Supine     Supine to sit: +2 for physical assistance;Max assist Sit to supine: Total assist;+2 for physical assistance   General bed mobility comments: requires significant total assist due to pain, utilized bed pads for positioning; daughter assisted with guiding trunk  Transfers Overall transfer level: Needs assistance Equipment used: None Transfers: Squat Pivot Transfers Sit to Stand: Max assist   Squat pivot transfers: Max assist     General transfer comment: pt unable to stand with RW (likely fearful of pain) so assisted back to bed from [facility] with squat pivot to pt's right side  Ambulation/Gait                  Stairs             Wheelchair Mobility    Modified Rankin (Stroke Patients Only)       Balance Overall balance assessment: Needs assistance Sitting-balance support: Feet supported;Single extremity supported Sitting balance-Leahy Scale: Fair Sitting balance - Comments: VC to sit upright and maintain balance   Standing balance support: Bilateral upper extremity supported Standing balance-Leahy Scale: Poor                              Cognition Arousal/Alertness: Awake/alert Behavior During Therapy: WFL for tasks assessed/performed Overall Cognitive Status: Within Functional Limits for tasks assessed                                        Exercises      General Comments        Pertinent Vitals/Pain Pain Assessment: Faces Pain Score: 6  Faces Pain Scale: Hurts even more Pain Location: L pelvis, L shoulder Pain Descriptors / Indicators: Discomfort;Grimacing;Guarding Pain Intervention(s): Repositioned;Monitored during session;Limited activity within patient's tolerance;RN gave pain meds during session(RN brought in muscle relaxer)    Home Living                      Prior Function            PT Goals (current goals can now be found in the care plan section) Acute Rehab  PT Goals Patient Stated Goal: return home, regain independence, less pain Progress towards PT goals: Progressing toward goals    Frequency    Min 3X/week      PT Plan Current plan remains appropriate    Co-evaluation              AM-PAC PT "6 Clicks" Mobility   Outcome Measure  Help needed turning from your back to your side while in a flat bed without using bedrails?: Total Help needed moving from lying on your back to sitting on the side of a flat bed without using bedrails?: Total Help needed moving to and from a bed to a chair (including a wheelchair)?: Total Help needed standing up from a chair using your arms (e.g., wheelchair  or bedside chair)?: Total Help needed to walk in hospital room?: Total Help needed climbing 3-5 steps with a railing? : Total 6 Click Score: 6    End of Session Equipment Utilized During Treatment: Gait belt Activity Tolerance: Patient limited by pain Patient left: with family/visitor present;in bed;with call bell/phone within reach;with bed alarm set   PT Visit Diagnosis: Other abnormalities of gait and mobility (R26.89);Muscle weakness (generalized) (M62.81)     Time: 4599-7741 PT Time Calculation (min) (ACUTE ONLY): 18 min  Charges:  $Therapeutic Activity: 8-22 mins                    Carmelia Bake, PT, DPT Acute Rehabilitation Services Office: [phone] Pager: [phone]  Trena Platt 09/24/2018, 4:07 PM

## 2018-09-24 NOTE — Progress Notes (Signed)
Occupational Therapy Treatment Patient Details Name: REBEKKAH POWLESS MRN: [id number] DOB: 1946/02/14 Today's Date: 09/24/2018    History of present illness Patient is a 73 y/o female presenting to the ED on 09/21/2018 with primary complaints of fall with associated pain. CT scan noted multiple pelvic fractures, all of which were nonoperable, per chart review. 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    OT comments  OT session focused on LUE ( gentle stretching and retrograde massage)  Increased time needed due to pain. Pt would scream out and tense up and need VC to relax.    Pts daughter very supportive and wants to take pt home but pain is limiting.  Follow Up Recommendations       Equipment Recommendations  3 in 1 bedside commode    Recommendations for Other Services      Precautions / Restrictions Precautions Precautions: Fall Precaution Comments: L UE hemiplegia Restrictions Weight Bearing Restrictions: Yes LLE Weight Bearing: Weight bearing as tolerated              ADL either performed or assessed with clinical judgement   ADL Overall ADL's : Needs assistance/impaired                                       General ADL Comments: OT session focused on LUE PROM and positioning.  Pt needed gentle stretching as well as retrograde massage for edema.  Educated pt and daugther on positioning as well as gentle ROM and massage.                 Cognition Arousal/Alertness: Awake/alert Behavior During Therapy: WFL for tasks assessed/performed Overall Cognitive Status: Within Functional Limits for tasks assessed                                          Exercises    Shoulder Instructions  Educated pt to keep LUE propped on pillows for edema as well as shoulder positioning.     General  Comments      Pertinent Vitals/ Pain       Pain Score: 6  Faces Pain Scale: Hurts even more Pain Location: Lpelvis, L shoulder Pain Descriptors / Indicators: Discomfort;Grimacing;Guarding;Sharp Pain Intervention(s): Limited activity within patient's tolerance;Repositioned;Monitored during session         Frequency  Min 2X/week        Progress Toward Goals  OT Goals(current goals can now be found in the care plan section)  Progress towards OT goals: Progressing toward goals  Acute Rehab OT Goals Patient Stated Goal: return home, regain independence, less pain OT Goal Formulation: With patient Time For Goal Achievement: 10/06/18 Potential to Achieve Goals: Good  Plan Discharge plan remains appropriate       AM-PAC OT "6 Clicks" Daily Activity     Outcome Measure   Help from another person eating meals?: A Little Help from another person taking care of personal grooming?: A Little Help from another person toileting, which includes using toliet, bedpan, or urinal?: Total Help from another person bathing (including washing, rinsing, drying)?: A Lot Help from another person to put on and taking off regular upper body clothing?: A Lot  Help from another person to put on and taking off regular lower body clothing?: Total 6 Click Score: 12    End of Session    OT Visit Diagnosis: Other abnormalities of gait and mobility (R26.89);Pain;Hemiplegia and hemiparesis Hemiplegia - Right/Left: Left Hemiplegia - dominant/non-dominant: Non-Dominant Pain - part of body: Shoulder   Activity Tolerance Patient tolerated treatment well   Patient Left in bed;with call bell/phone within reach;with family/visitor present   Nurse Communication Mobility status        Time: 1015-1100 OT Time Calculation (min): 45 min  Charges: OT General Charges $OT Visit: 1 Visit OT Treatments $Neuromuscular Re-education: 38-52 mins  Kari Baars, [place] [phone] Office- [place], Edwena Felty D 09/24/2018, 1:44 PM

## 2018-09-25 LAB — BASIC METABOLIC PANEL
Anion gap: 7 (ref 5–15)
BUN: 22 mg/dL (ref 8–23)
CO2: 21 mmol/L — AB (ref 22–32)
Calcium: 8.7 mg/dL — ABNORMAL LOW (ref 8.9–10.3)
Chloride: 107 mmol/L (ref 98–111)
Creatinine, Ser: 1.71 mg/dL — ABNORMAL HIGH (ref 0.44–1.00)
GFR calc Af Amer: 34 mL/min — ABNORMAL LOW (ref >60)
GFR calc non Af Amer: 29 mL/min — ABNORMAL LOW (ref >60)
Glucose, Bld: 106 mg/dL — ABNORMAL HIGH (ref 70–99)
Potassium: 3.7 mmol/L (ref 3.5–5.1)
Sodium: 135 mmol/L (ref 135–145)

## 2018-09-25 LAB — CBC
HCT: 29.1 % — ABNORMAL LOW (ref 36.0–46.0)
Hemoglobin: 9.3 g/dL — ABNORMAL LOW (ref 12.0–15.0)
MCH: 27.6 pg (ref 26.0–34.0)
MCHC: 32 g/dL (ref 30.0–36.0)
MCV: 86.4 fL (ref 80.0–100.0)
Platelets: 236 10*3/uL (ref 150–400)
RBC: 3.37 MIL/uL — ABNORMAL LOW (ref 3.87–5.11)
RDW: 13.1 % (ref 11.5–15.5)
WBC: 6.9 10*3/uL (ref 4.0–10.5)
nRBC: 0 % (ref 0.0–0.2)

## 2018-09-25 LAB — GLUCOSE, CAPILLARY
Glucose-Capillary: 94 mg/dL (ref 70–99)
Glucose-Capillary: 135 mg/dL — ABNORMAL HIGH (ref 70–99)
Glucose-Capillary: 102 mg/dL — ABNORMAL HIGH (ref 70–99)
Glucose-Capillary: 128 mg/dL — ABNORMAL HIGH (ref 70–99)

## 2018-09-25 MED ORDER — OXYCODONE HCL 5 MG PO TABS: 5.0000 mg | ORAL_TABLET | Freq: Three times a day (TID) | ORAL | Status: DC | PRN

## 2018-09-25 MED ORDER — SENNOSIDES-DOCUSATE SODIUM 8.6-50 MG PO TABS
2.0000 | ORAL_TABLET | Freq: Two times a day (BID) | ORAL | Status: DC
  Administered 2018-09-25 – 2018-09-26 (×2): 2 via ORAL
  Filled 2018-09-25 (×2): qty 2

## 2018-09-25 MED ORDER — MILK AND MOLASSES ENEMA
1.0000 | Freq: Once | RECTAL | Status: AC
  Administered 2018-09-25: 250 mL via RECTAL

## 2018-09-25 NOTE — Progress Notes (Signed)
Physical Therapy Treatment Patient Details Name: [name] MRN: [id number] DOB: 29-Jun-1946 Today's Date: 09/25/2018    History of Present Illness Patient is a 73 y/o female presenting to the ED on 09/21/2018 with primary complaints of fall with associated pain. CT scan noted multiple pelvic fractures, all of which were nonoperable, per chart review. 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     PT Comments    Pt assisted with sit to stands today using RW and continues to require significant assist.  Pt also requires multimodal cues and assist for balance.  Pt and daughter prefer home and if home, pt will require increased home care.   Follow Up Recommendations  SNF;Supervision/Assistance - 24 hour(pt and family would prefer home)     Equipment Recommendations  Wheelchair cushion (measurements PT);Wheelchair (measurements PT);Hospital bed    Recommendations for Other Services       Precautions / Restrictions Precautions Precautions: Fall Precaution Comments: L UE hemiplegia Restrictions LLE Weight Bearing: Weight bearing as tolerated    Mobility  Bed Mobility               General bed mobility comments: pt up on BSC on arrival  Transfers Overall transfer level: Needs assistance Equipment used: Rolling walker (2 wheeled) Transfers: Sit to/from Stand Sit to Stand: Max assist;+2 physical assistance         General transfer comment: pt requiring +2 assist to stand from [facility] using RW, pt assisted off BSC by moving BSC and replacing with recliner; pt then performed sit to stand again from recliner; multimodal cues for hand placement and weight shifting pt with difficulty placing weight mostly on R side to self assist despite multimodal cues  Ambulation/Gait                 Stairs             Wheelchair  Mobility    Modified Rankin (Stroke Patients Only)       Balance Overall balance assessment: Needs assistance         Standing balance support: Bilateral upper extremity supported Standing balance-Leahy Scale: Zero Standing balance comment: has posterior bias upon standing, requires significant assist to maintain upright posture                            Cognition Arousal/Alertness: Awake/alert Behavior During Therapy: WFL for tasks assessed/performed Overall Cognitive Status: Within Functional Limits for tasks assessed                                        Exercises      General Comments        Pertinent Vitals/Pain Pain Assessment: Faces Faces Pain Scale: Hurts even more Pain Location: L pelvis, L shoulder Pain Descriptors / Indicators: Discomfort;Grimacing;Guarding Pain Intervention(s): Monitored during session;Repositioned;Limited activity within patient's tolerance(daughter reports pt is only taking muscle relaxers now)    Home Living                      Prior Function            PT Goals (current goals can now be found in the care plan section) Progress towards PT goals: Progressing toward goals  Frequency    Min 3X/week      PT Plan Current plan remains appropriate    Co-evaluation              AM-PAC PT "6 Clicks" Mobility   Outcome Measure  Help needed turning from your back to your side while in a flat bed without using bedrails?: Total Help needed moving from lying on your back to sitting on the side of a flat bed without using bedrails?: Total Help needed moving to and from a bed to a chair (including a wheelchair)?: Total Help needed standing up from a chair using your arms (e.g., wheelchair or bedside chair)?: Total Help needed to walk in hospital room?: Total Help needed climbing 3-5 steps with a railing? : Total 6 Click Score: 6    End of Session Equipment Utilized During Treatment:  Gait belt Activity Tolerance: Patient limited by pain Patient left: in chair;with call bell/phone within reach;with family/visitor present   PT Visit Diagnosis: Other abnormalities of gait and mobility (R26.89);Muscle weakness (generalized) (M62.81)     Time: 9450-3888 PT Time Calculation (min) (ACUTE ONLY): 20 min  Charges:  $Therapeutic Activity: 8-22 mins                   Carmelia Bake, PT, DPT Acute Rehabilitation Services Office: [phone] Pager: [phone]   Trena Platt 09/25/2018, 12:07 PM

## 2018-09-25 NOTE — Progress Notes (Signed)
Occupational Therapy Treatment Patient Details Name: [name] MRN: [id number] DOB: October 09, 1945 Today's Date: 09/25/2018    History of present illness Patient is a 73 y/o female presenting to the ED on 09/21/2018 with primary complaints of fall with associated pain. CT scan noted multiple pelvic fractures, all of which were nonoperable, per chart review. 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    OT comments  Daughter is GREAT support for pt.  Pt needs post acute rehab but daughter doesn't want SNF at this time due to inability to go see the patient.     Follow Up Recommendations  CIR;Home health OT;Supervision/Assistance - 24 hour(if daugther able to provide care.)    Equipment Recommendations  3 in 1 bedside commode    Recommendations for Other Services      Precautions / Restrictions Precautions Precautions: Fall Precaution Comments: L UE hemiplegia Restrictions LLE Weight Bearing: Weight bearing as tolerated       Mobility Bed Mobility               General bed mobility comments: Pt OOB  Transfers Overall transfer level: Needs assistance Equipment used: Rolling walker (2 wheeled) Transfers: Sit to/from Stand Sit to Stand: Max assist         General transfer comment: pt requiring +2 assist to stand from [facility] using RW, pt assisted off BSC by moving BSC and replacing with recliner; pt then performed sit to stand again from recliner; multimodal cues for hand placement and weight shifting pt with difficulty placing weight mostly on R side to self assist despite multimodal cues    Balance Overall balance assessment: Needs assistance Sitting-balance support: Single extremity supported Sitting balance-Leahy Scale: Fair     Standing balance support: Bilateral upper extremity supported Standing balance-Leahy  Scale: Zero Standing balance comment: has posterior bias upon standing, requires significant assist to maintain upright posture                           ADL either performed or assessed with clinical judgement   ADL Overall ADL's : Needs assistance/impaired                         Toilet Transfer: Maximal assistance;BSC;Squat-pivot;Cueing for safety;Cueing for sequencing Toilet Transfer Details (indicate cue type and reason): going to right           General ADL Comments: TRansitioned pt to chair after sitting on BSC.   Educated pt and daughter in ROM LUE as well as proper positoining.                 Cognition Arousal/Alertness: Awake/alert Behavior During Therapy: WFL for tasks assessed/performed Overall Cognitive Status: Within Functional Limits for tasks assessed                                          Exercises  Gentle ROM LUE in sitting as well as OT applied kinesiotape to pts L trapezius area to A with muscle tightness and pain.   Shoulder Instructions  Educated pt and daughter on positioning LUE to maintain good joint space for shoulder as well as prevent edema.  Encouraged pt to move LUE as much as possible  Pertinent Vitals/ Pain       Pain Assessment: Faces Pain Score: 4  Faces Pain Scale: Hurts even more Pain Location: l shoulder Pain Descriptors / Indicators: Discomfort;Grimacing;Guarding Pain Intervention(s): Limited activity within patient's tolerance         Frequency  Min 2X/week        Progress Toward Goals  OT Goals(current goals can now be found in the care plan section)     Acute Rehab OT Goals Patient Stated Goal: return home, regain independence, less pain OT Goal Formulation: With patient Time For Goal Achievement: 10/06/18 Potential to Achieve Goals: Good  Plan Discharge plan remains appropriate       AM-PAC OT "6 Clicks" Daily Activity     Outcome Measure   Help from  another person eating meals?: A Little Help from another person taking care of personal grooming?: A Little Help from another person toileting, which includes using toliet, bedpan, or urinal?: Total Help from another person bathing (including washing, rinsing, drying)?: A Lot Help from another person to put on and taking off regular upper body clothing?: A Lot Help from another person to put on and taking off regular lower body clothing?: Total 6 Click Score: 12    End of Session Equipment Utilized During Treatment: Gait belt  OT Visit Diagnosis: Other abnormalities of gait and mobility (R26.89);Pain;Hemiplegia and hemiparesis;History of falling (Z91.81);Repeated falls (R29.6) Hemiplegia - Right/Left: Left Hemiplegia - dominant/non-dominant: Non-Dominant Hemiplegia - caused by: Cerebral infarction Pain - Right/Left: Left Pain - part of body: Shoulder   Activity Tolerance Patient tolerated treatment well   Patient Left with call bell/phone within reach;with family/visitor present;Other (comment)(on BSC)   Nurse Communication Mobility status        Time: 1115-1140 OT Time Calculation (min): 25 min  Charges: OT General Charges $OT Visit: 1 Visit OT Treatments $Self Care/Home Management : 23-37 mins  Kari Baars, [place] [phone] Office- [phone], Edwena Felty D 09/25/2018, 3:34 PM

## 2018-09-25 NOTE — Progress Notes (Signed)
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 , CT scan noted multiple pelvic fractures, all of which were non operable. Orthopedics consulted and recommendations were for conservative management with weightbearing as tolerated, physical therapy, mobilization.  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   -Continue oxycodone, per patient and family decreased in frequency, to work again today with physical therapy -Physical therapy recommended SNF however patient and family wishes to return home, anticipate discharge within 24-48 hours based on how she progresses with physical therapy  Active Problems Chronic kidney disease stage III -1.6-1.7, currently at baseline  Left shoulder pain -X-ray negative for fractures, on lidocaine patch and K pad, improved today  Constipation -Still no bowel movements yet, increase bowel regimen  Diabetes mellitus -Continue sliding scale insulin  Fever due to bronchitis -Patient with URI type symptoms over the past week, febrile yesterday, concern atelectasis  versus bronchitis as per chest x-ray.  Favored to do doxycycline for a few days -Fever now resolved, will not need antibiotics on home discharge  History of CVA with left-sided weakness -Stable  Rash lower back -Minimal, improved today, will continue to monitor.  Hypertension -Continue losartan, blood pressure on the high side likely due to pain   Scheduled Meds: . aspirin EC  81 mg Oral Daily  . doxycycline  100 mg Oral Q12H  . enoxaparin (LOVENOX) injection  30 mg Subcutaneous Q24H  . insulin aspart  0-5 Units Subcutaneous QHS  . insulin aspart  0-9 Units Subcutaneous TID WC  . lidocaine  1 patch Transdermal Q24H  . loratadine  10 mg Oral Daily  . losartan  25 mg Oral Daily  . polyethylene glycol  17 g Oral Daily  . senna-docusate  1 tablet Oral BID   Continuous Infusions: PRN Meds:.acetaminophen **OR** acetaminophen, hydrALAZINE, hydrOXYzine, methocarbamol, morphine injection, ondansetron **OR** ondansetron (ZOFRAN) IV, oxyCODONE  DVT prophylaxis: Lovenox Code Status: Full code Family Communication: daughter present at bedside  Disposition Plan: Home with home health once progressing with therapy  Consultants:   Orthopedic surgery  Procedures:   None   Antimicrobials:  Doxycycline 3/18 >>  Objective: Vitals:   09/24/18 0659 09/24/18 1340 09/24/18 2123 09/25/18 0430  BP: (!) 193/66 (!) 167/54 (!) 167/51 (!) 167/58  Pulse: 78 67 74 75  Resp: 18 18 18 18   Temp: 98.6 F (37 C) 99.6 F (37.6 C) 99.6 F (37.6 C) 98.7 F (37.1 C)  TempSrc: Oral Oral Oral Oral  SpO2: 93% 97% 93% 92%  Weight:      Height:        Intake/Output Summary (Last 24 hours) at 09/25/2018 1052 Last data filed at 09/25/2018 1000 Gross per 24 hour  Intake 600 ml  Output 700 ml  Net -100 ml   Filed Weights   09/21/18 0847  Weight: 68 kg    Examination:  Constitutional: No distress Eyes: No icterus seen ENMT: mmm Respiratory: Clear to auscultation bilaterally, no wheezing, no  crackles Cardiovascular: Regular rate and rhythm, no murmurs appreciated Abdomen: Soft, nontender, nondistended, positive bowel sounds Musculoskeletal: no clubbing / cyanosis. Skin: No new rashes Neurologic: Nonfocal, equal strength   Data Reviewed: I have independently reviewed following labs and imaging studies   CBC: Recent Labs  Lab 09/21/18 1310 09/22/18 0437 09/23/18 0415 09/25/18 0434  WBC 10.3 8.8 8.0 6.9  NEUTROABS 7.3  --   --   --   HGB 12.4 11.7* 9.9* 9.3*  HCT 37.8 36.7 30.9* 29.1*  MCV 84.9 87.0 86.8 86.4  PLT 267 206 214 676   Basic Metabolic Panel: Recent Labs  Lab 09/21/18 1310 09/22/18 0437 09/23/18 0415 09/25/18 0434  NA 140 140 137 135  K 3.8 4.2 4.0 3.7  CL 107 110 110 107  CO2 24 23 18* 21*  GLUCOSE 118* 109* 75 106*  BUN 21 22 22 22   CREATININE 1.58* 1.73* 1.76* 1.71*  CALCIUM 9.0 8.5* 8.5* 8.7*   GFR: Estimated Creatinine Clearance: 26.5 mL/min (A) (by C-G formula based on SCr of 1.71 mg/dL (H)). Liver Function Tests: No results for input(s): AST, ALT, ALKPHOS, BILITOT, PROT, ALBUMIN in the last 168 hours. No results for input(s): LIPASE, AMYLASE in the last 168 hours. No results for input(s): AMMONIA in the last 168 hours. Coagulation Profile: No results for input(s): INR, PROTIME in the last 168 hours. Cardiac Enzymes: No results for input(s): CKTOTAL, CKMB, CKMBINDEX, TROPONINI in the last 168 hours. BNP (last 3 results) No results for input(s): PROBNP in the last 8760 hours. HbA1C: No results for input(s): HGBA1C in the last 72 hours. CBG: Recent Labs  Lab 09/24/18 0754 09/24/18 1153 09/24/18 1617 09/24/18 2021 09/25/18 0736  GLUCAP 83 123* 98 118* 94   Lipid Profile: No results for input(s): CHOL, HDL, LDLCALC, TRIG, CHOLHDL, LDLDIRECT in the last 72 hours. Thyroid Function Tests: No results for input(s): TSH, T4TOTAL, FREET4, T3FREE, THYROIDAB in the last 72 hours. Anemia Panel: No results for input(s): VITAMINB12,  FOLATE, FERRITIN, TIBC, IRON, RETICCTPCT in the last 72 hours. Urine analysis:    Component Value Date/Time   COLORURINE STRAW (A) 09/23/2018 0411   APPEARANCEUR CLEAR 09/23/2018 0411   LABSPEC 1.005 09/23/2018 0411   PHURINE 6.0 09/23/2018 0411   GLUCOSEU NEGATIVE 09/23/2018 0411   HGBUR SMALL (A) 09/23/2018 0411   BILIRUBINUR NEGATIVE 09/23/2018 0411   KETONESUR NEGATIVE 09/23/2018 0411   PROTEINUR 30 (A) 09/23/2018 0411   NITRITE NEGATIVE 09/23/2018 0411   LEUKOCYTESUR NEGATIVE 09/23/2018 0411   Sepsis Labs: Invalid input(s): PROCALCITONIN, LACTICIDVEN  Recent Results (from the past 240 hour(s))  Culture, blood (Routine X 2) w Reflex to ID Panel     Status: None (Preliminary result)   Collection Time: 09/22/18  6:43 PM  Result Value Ref Range Status   Specimen Description   Final    BLOOD LEFT ANTECUBITAL Performed at [facility] [address], [place], [place] [address]    Special Requests   Final    BOTTLES DRAWN AEROBIC AND ANAEROBIC Blood Culture adequate volume Performed at [facility] [address] [place], [place] [address]    Culture   Final    NO GROWTH 2 DAYS Performed at [facility], [place] [address] [place], [place] [address]    Report Status PENDING  Incomplete  Culture, blood (Routine X 2) w Reflex to ID Panel     Status: None (Preliminary result)   Collection Time: 09/22/18  6:47 PM  Result Value Ref Range Status   Specimen Description   Final    BLOOD RIGHT HAND Performed at [facility] [address], [place], [place] [address]    Special Requests   Final    BOTTLES DRAWN AEROBIC AND ANAEROBIC Blood Culture adequate volume Performed at [facility] [address], [place], [place] [address]    Culture   Final    NO GROWTH 2 DAYS Performed at [facility] [address] [place], [place] [address]    Report Status PENDING  Incomplete       Radiology Studies: No results found.  Marzetta Board, MD, PhD [facility]  Contact via  [email or web address]  [facility] P: [phone]  F: [phone]

## 2018-09-26 LAB — GLUCOSE, CAPILLARY
GLUCOSE-CAPILLARY: 103 mg/dL — AB (ref 70–99)
Glucose-Capillary: 132 mg/dL — ABNORMAL HIGH (ref 70–99)
Glucose-Capillary: 148 mg/dL — ABNORMAL HIGH (ref 70–99)
Glucose-Capillary: 98 mg/dL (ref 70–99)

## 2018-09-26 NOTE — Progress Notes (Signed)
Physical Therapy Treatment Patient Details Name: [name] MRN: [id number] DOB: 1945-10-27 Today's Date: 09/26/2018    History of Present Illness Patient is a 73 y/o female presenting to the ED on 09/21/2018 with primary complaints of fall with associated pain. CT scan noted multiple pelvic fractures, all of which were nonoperable, per chart review. 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     PT Comments    Pt progressing slowly; limited by pain, dtr understandably wants to take her home; they may need a lift for home   Follow Up Recommendations  Home health PT;Supervision/Assistance - 24 hour(refuses SNF)     Equipment Recommendations  Other (comment)(mechanical lift)    Recommendations for Other Services       Precautions / Restrictions Precautions Precautions: Fall Precaution Comments: L UE hemiplegia Restrictions LLE Weight Bearing: Weight bearing as tolerated    Mobility  Bed Mobility   Bed Mobility: Sit to Supine       Sit to supine: Total assist;+2 for physical assistance   General bed mobility comments: assist with  trunk and LEs  Transfers Overall transfer level: Needs assistance Equipment used: Rolling walker (2 wheeled) Transfers: Sit to/from Stand;Lateral/Scoot Transfers Sit to Stand: Max assist;+2 safety/equipment;+2 physical assistance;Mod assist        Lateral/Scoot Transfers: +2 physical assistance;+2 safety/equipment;Total assist General transfer comment: pt requiring +2 assist to stand from recliner requiring mod +2 on first trial, max +2 on second, unable on 3rd;  multimodal cues for hand placement and weight shifting pt with difficulty placing weight mostly on R side to self assist despite multimodal cues  Ambulation/Gait                 Stairs             Wheelchair  Mobility    Modified Rankin (Stroke Patients Only)       Balance       Sitting balance - Comments: VC to sit upright and maintain balance   Standing balance support: Bilateral upper extremity supported Standing balance-Leahy Scale: Zero Standing balance comment: has posterior bias upon standing, requires significant assist to maintain upright posture                            Cognition Arousal/Alertness: Awake/alert Behavior During Therapy: WFL for tasks assessed/performed Overall Cognitive Status: Within Functional Limits for tasks assessed                                        Exercises General Exercises - Lower Extremity Long Arc Quad: AROM;Left;10 reps;Seated    General Comments        Pertinent Vitals/Pain Pain Assessment: Faces Faces Pain Scale: Hurts whole lot Pain Location: L hip Pain Descriptors / Indicators: Discomfort;Grimacing;Guarding Pain Intervention(s): Limited activity within patient's tolerance;Monitored during session    Home Living                      Prior Function            PT Goals (current goals can now be found in the care plan section) Acute Rehab PT Goals Patient Stated Goal: return home, regain independence, less pain PT Goal Formulation: With patient Time  For Goal Achievement: 10/06/18 Potential to Achieve Goals: Fair Progress towards PT goals: Progressing toward goals    Frequency    Min 3X/week      PT Plan Current plan remains appropriate    Co-evaluation              AM-PAC PT "6 Clicks" Mobility   Outcome Measure  Help needed turning from your back to your side while in a flat bed without using bedrails?: Total Help needed moving from lying on your back to sitting on the side of a flat bed without using bedrails?: Total Help needed moving to and from a bed to a chair (including a wheelchair)?: Total Help needed standing up from a chair using your arms (e.g.,  wheelchair or bedside chair)?: Total Help needed to walk in hospital room?: Total Help needed climbing 3-5 steps with a railing? : Total 6 Click Score: 6    End of Session Equipment Utilized During Treatment: Gait belt Activity Tolerance: Patient limited by pain Patient left: in bed;with call bell/phone within reach;with family/visitor present Nurse Communication: Mobility status PT Visit Diagnosis: Other abnormalities of gait and mobility (R26.89);Muscle weakness (generalized) (M62.81)     Time: 2703-5009 PT Time Calculation (min) (ACUTE ONLY): 38 min  Charges:  $Therapeutic Activity: 38-52 mins                     Kenyon Ana, PT  Pager: [phone] Acute Rehab Dept [facility]): [phone]   09/26/2018    [facility] 09/26/2018, 3:22 PM

## 2018-09-26 NOTE — Progress Notes (Signed)
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 , CT scan noted multiple pelvic fractures, all of which were non operable. Orthopedics consulted and recommendations were for conservative management with weightbearing as tolerated, physical therapy, mobilization.  Subjective: -She is in so much pain this afternoon after working with PT and being off narcotics, barely able to move.  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   -Continue oxycodone, per patient and family decreased in frequency, to work again today with physical therapy -Physical therapy recommended SNF however patient and family wishes to return home -Physical therapy will work with the patient 1 more time tomorrow morning then plan for discharge home  Active Problems Chronic kidney disease stage III -1.6-1.7, currently at baseline  Left shoulder pain -X-ray negative for fractures, on lidocaine patch and K pad, improved today  Constipation -Improved after enema  Diabetes mellitus -Keep on sliding scale  Fever due to bronchitis -Patient with URI type symptoms over the past week, febrile yesterday, concern atelectasis versus bronchitis as per chest x-ray.  Favored to do doxycycline for a few days -Fever now  resolved, continue antibiotics while hospitalized but will not need on discharge  History of CVA with left-sided weakness -Stable  Rash lower back -Minimal, improved today, will continue to monitor.  Hypertension -Continue losartan, blood pressure on the high side likely due to pain, continue hydralazine PRN  Stage I pressure ulcers on bilateral heels -not present on admission  Scheduled Meds: . aspirin EC  81 mg Oral Daily  . doxycycline  100 mg Oral Q12H  . enoxaparin (LOVENOX) injection  30 mg Subcutaneous Q24H  . insulin aspart  0-5 Units Subcutaneous QHS  . insulin aspart  0-9 Units Subcutaneous TID WC  . lidocaine  1 patch Transdermal Q24H  . loratadine  10 mg Oral Daily  . losartan  25 mg Oral Daily  . polyethylene glycol  17 g Oral Daily  . senna-docusate  2 tablet Oral BID   Continuous Infusions: PRN Meds:.acetaminophen **OR** acetaminophen, hydrALAZINE, hydrOXYzine, methocarbamol, ondansetron **OR** ondansetron (ZOFRAN) IV, oxyCODONE  DVT prophylaxis: Lovenox Code Status: Full code Family Communication: daughter present at bedside  Disposition Plan: Home with home health once progressing with therapy  Consultants:   Orthopedic surgery  Procedures:   None   Antimicrobials:  Doxycycline 3/18 >>  Objective: Vitals:   09/26/18 0523 09/26/18 0629 09/26/18 1450 09/26/18 1525  BP: (!) 202/68 (!) 165/55 (!) 182/64 (!) 182/64  Pulse: 65 75 65   Resp: 16  16   Temp: 97.8 F (36.6 C)  97.6 F (36.4 C)   TempSrc: Oral  Oral   SpO2: 95%  97%   Weight:      Height:        Intake/Output Summary (Last 24 hours) at 09/26/2018 1555 Last data filed at 09/26/2018 1400 Gross per 24  hour  Intake 840 ml  Output 850 ml  Net -10 ml   Filed Weights   09/21/18 0847  Weight: 68 kg    Examination:  Constitutional: NAD Eyes: No scleral icterus ENMT: Moist mucous membranes Respiratory: Clear to auscultation bilaterally without wheezing or  crackles Cardiovascular: Regular rate and rhythm, no murmurs appreciated Abdomen: Soft, nontender, nondistended, positive bowel sounds Musculoskeletal: no clubbing / cyanosis. Skin: No new rashes Neurologic: No focal deficits   Data Reviewed: I have independently reviewed following labs and imaging studies   CBC: Recent Labs  Lab 09/21/18 1310 09/22/18 0437 09/23/18 0415 09/25/18 0434  WBC 10.3 8.8 8.0 6.9  NEUTROABS 7.3  --   --   --   HGB 12.4 11.7* 9.9* 9.3*  HCT 37.8 36.7 30.9* 29.1*  MCV 84.9 87.0 86.8 86.4  PLT 267 206 214 062   Basic Metabolic Panel: Recent Labs  Lab 09/21/18 1310 09/22/18 0437 09/23/18 0415 09/25/18 0434  NA 140 140 137 135  K 3.8 4.2 4.0 3.7  CL 107 110 110 107  CO2 24 23 18* 21*  GLUCOSE 118* 109* 75 106*  BUN 21 22 22 22   CREATININE 1.58* 1.73* 1.76* 1.71*  CALCIUM 9.0 8.5* 8.5* 8.7*   GFR: Estimated Creatinine Clearance: 26.5 mL/min (A) (by C-G formula based on SCr of 1.71 mg/dL (H)). Liver Function Tests: No results for input(s): AST, ALT, ALKPHOS, BILITOT, PROT, ALBUMIN in the last 168 hours. No results for input(s): LIPASE, AMYLASE in the last 168 hours. No results for input(s): AMMONIA in the last 168 hours. Coagulation Profile: No results for input(s): INR, PROTIME in the last 168 hours. Cardiac Enzymes: No results for input(s): CKTOTAL, CKMB, CKMBINDEX, TROPONINI in the last 168 hours. BNP (last 3 results) No results for input(s): PROBNP in the last 8760 hours. HbA1C: No results for input(s): HGBA1C in the last 72 hours. CBG: Recent Labs  Lab 09/25/18 1153 09/25/18 1655 09/25/18 2128 09/26/18 0733 09/26/18 1211  GLUCAP 135* 102* 128* 132* 148*   Lipid Profile: No results for input(s): CHOL, HDL, LDLCALC, TRIG, CHOLHDL, LDLDIRECT in the last 72 hours. Thyroid Function Tests: No results for input(s): TSH, T4TOTAL, FREET4, T3FREE, THYROIDAB in the last 72 hours. Anemia Panel: No results for input(s): VITAMINB12,  FOLATE, FERRITIN, TIBC, IRON, RETICCTPCT in the last 72 hours. Urine analysis:    Component Value Date/Time   COLORURINE STRAW (A) 09/23/2018 0411   APPEARANCEUR CLEAR 09/23/2018 0411   LABSPEC 1.005 09/23/2018 0411   PHURINE 6.0 09/23/2018 0411   GLUCOSEU NEGATIVE 09/23/2018 0411   HGBUR SMALL (A) 09/23/2018 0411   BILIRUBINUR NEGATIVE 09/23/2018 0411   KETONESUR NEGATIVE 09/23/2018 0411   PROTEINUR 30 (A) 09/23/2018 0411   NITRITE NEGATIVE 09/23/2018 0411   LEUKOCYTESUR NEGATIVE 09/23/2018 0411   Sepsis Labs: Invalid input(s): PROCALCITONIN, LACTICIDVEN  Recent Results (from the past 240 hour(s))  Culture, blood (Routine X 2) w Reflex to ID Panel     Status: None (Preliminary result)   Collection Time: 09/22/18  6:43 PM  Result Value Ref Range Status   Specimen Description   Final    BLOOD LEFT ANTECUBITAL Performed at [facility] [address] [place], [place] [address]    Special Requests   Final    BOTTLES DRAWN AEROBIC AND ANAEROBIC Blood Culture adequate volume Performed at [facility] [address] [place], [place] [address]    Culture   Final    NO GROWTH 4 DAYS Performed  at [facility], [place] [address], [place], [place] [address]    Report Status PENDING  Incomplete  Culture, blood (Routine X 2) w Reflex to ID Panel     Status: None (Preliminary result)   Collection Time: 09/22/18  6:47 PM  Result Value Ref Range Status   Specimen Description   Final    BLOOD RIGHT HAND Performed at [facility] [address] [place], [place] [address]    Special Requests   Final    BOTTLES DRAWN AEROBIC AND ANAEROBIC Blood Culture adequate volume Performed at [facility] [address], [place], [place] [address]    Culture   Final    NO GROWTH 4 DAYS Performed at [facility], [place] [address], [place], [place] [address]    Report Status PENDING  Incomplete       Radiology Studies: No results found.  Marzetta Board, MD, PhD [facility]  Contact via  [email or web address]  [facility] P: [phone]  F: [phone]

## 2018-09-26 NOTE — Progress Notes (Signed)
Patient currently has MEWS score of 2, BP 202/68, patient has had elevated baseline BP due to pain. Patient does not currently want pain meds for pain due to constipation that was relieved earlier. MD on call notified and Hydralazine 10 mg was given @ 0540. Will monitor BP and er check. Dawson Bills, RN

## 2018-09-26 NOTE — Progress Notes (Signed)
Robaxin 500mg  PRN ordered. Patient requested 125mg  of Robaxin. Same administered per patient's request. 09/26/18-- C. [place] RN-BSN

## 2018-09-26 NOTE — Plan of Care (Signed)
  Problem: Education: Goal: Verbalization of understanding the information provided (i.e., activity precautions, restrictions, etc) will improve Outcome: Progressing   Problem: Activity: Goal: Ability to ambulate and perform ADLs will improve Outcome: Progressing   Problem: Self-Concept: Goal: Ability to maintain and perform role responsibilities to the fullest extent possible will improve Outcome: Progressing   Problem: Pain Management: Goal: Pain level will decrease Outcome: Progressing   

## 2018-09-27 DIAGNOSIS — L8990 Pressure ulcer of unspecified site, unspecified stage: Secondary | ICD-10-CM

## 2018-09-27 DIAGNOSIS — L89601 Pressure ulcer of unspecified heel, stage 1: Secondary | ICD-10-CM

## 2018-09-27 DIAGNOSIS — R509 Fever, unspecified: Secondary | ICD-10-CM

## 2018-09-27 LAB — CULTURE, BLOOD (ROUTINE X 2)
CULTURE: NO GROWTH
Culture: NO GROWTH
Special Requests: ADEQUATE
Special Requests: ADEQUATE

## 2018-09-27 LAB — GLUCOSE, CAPILLARY: Glucose-Capillary: 97 mg/dL (ref 70–99)

## 2018-09-27 MED ORDER — ACETAMINOPHEN ER 650 MG PO TBCR: 650.0000 mg | EXTENDED_RELEASE_TABLET | Freq: Four times a day (QID) | ORAL | 0 refills | Status: DC | PRN

## 2018-09-27 MED ORDER — LIDOCAINE 5 % EX PTCH: 1.0000 | MEDICATED_PATCH | Freq: Two times a day (BID) | CUTANEOUS | 11 refills | Status: DC

## 2018-09-27 MED ORDER — DOXYCYCLINE HYCLATE 100 MG PO TABS: 100.0000 mg | ORAL_TABLET | Freq: Two times a day (BID) | ORAL | 0 refills | Status: DC

## 2018-09-27 MED ORDER — OXYCODONE HCL 5 MG PO TABS: 5.0000 mg | ORAL_TABLET | Freq: Four times a day (QID) | ORAL | 0 refills | Status: AC | PRN

## 2018-09-27 MED ORDER — LOSARTAN POTASSIUM 25 MG PO TABS: 25.0000 mg | ORAL_TABLET | Freq: Every day | ORAL | 1 refills | Status: DC

## 2018-09-27 MED ORDER — METHOCARBAMOL 500 MG PO TABS: 500.0000 mg | ORAL_TABLET | Freq: Three times a day (TID) | ORAL | 0 refills | Status: DC | PRN

## 2018-09-27 NOTE — Discharge Instructions (Signed)
Follow with [name] Passy, MD as needed  Please get a complete blood count and chemistry panel checked by your Primary MD at your next visit, and again as instructed by your Primary MD. Please get your medications reviewed and adjusted by your Primary MD.  Please request your Primary MD to go over all Hospital Tests and Procedure/Radiological results at the follow up, please get all Hospital records sent to your Prim MD by signing hospital release before you go home.  In some cases, there will be blood work, cultures and biopsy results pending at the time of your discharge. Please request that your primary care M.D. goes through all the records of your hospital data and follows up on these results.  If you had Pneumonia of Lung problems at the Hospital: Please get a 2 view Chest X ray done in 6-8 weeks after hospital discharge or sooner if instructed by your Primary MD.  If you have Congestive Heart Failure: Please call your Cardiologist or Primary MD anytime you have any of the following symptoms:  1) 3 pound weight gain in 24 hours or 5 pounds in 1 week  2) shortness of breath, with or without a dry hacking cough  3) swelling in the hands, feet or stomach  4) if you have to sleep on extra pillows at night in order to breathe  Follow cardiac low salt diet and 1.5 lit/day fluid restriction.  If you have diabetes Accuchecks 4 times/day, Once in AM empty stomach and then before each meal. Log in all results and show them to your primary doctor at your next visit. If any glucose reading is under 80 or above 300 call your primary MD immediately.  If you have Seizure/Convulsions/Epilepsy: Please do not drive, operate heavy machinery, participate in activities at heights or participate in high speed sports until you have seen by Primary MD or a Neurologist and advised to do so again.  If you had Gastrointestinal Bleeding: Please ask your Primary MD to check a complete blood count within one  week of discharge or at your next visit. Your endoscopic/colonoscopic biopsies that are pending at the time of discharge, will also need to followed by your Primary MD.  Get Medicines reviewed and adjusted. Please take all your medications with you for your next visit with your Primary MD  Please request your Primary MD to go over all hospital tests and procedure/radiological results at the follow up, please ask your Primary MD to get all Hospital records sent to his/her office.  If you experience worsening of your admission symptoms, develop shortness of breath, life threatening emergency, suicidal or homicidal thoughts you must seek medical attention immediately by calling 911 or calling your MD immediately  if symptoms less severe.  You must read complete instructions/literature along with all the possible adverse reactions/side effects for all the Medicines you take and that have been prescribed to you. Take any new Medicines after you have completely understood and accpet all the possible adverse reactions/side effects.   Do not drive or operate heavy machinery when taking Pain medications.   Do not take more than prescribed Pain, Sleep and Anxiety Medications  Special Instructions: If you have smoked or chewed Tobacco  in the last 2 yrs please stop smoking, stop any regular Alcohol  and or any Recreational drug use.  Wear Seat belts while driving.  Please note You were cared for by a hospitalist during your hospital stay. If you have any questions about your discharge  medications or the care you received while you were in the hospital after you are discharged, you can call the unit and asked to speak with the hospitalist on call if the hospitalist that took care of you is not available. Once you are discharged, your primary care physician will handle any further medical issues. Please note that NO REFILLS for any discharge medications will be authorized once you are discharged, as it is  imperative that you return to your primary care physician (or establish a relationship with a primary care physician if you do not have one) for your aftercare needs so that they can reassess your need for medications and monitor your lab values.  You can reach the hospitalist office at phone [phone] or fax [phone]   If you do not have a primary care physician, you can call [phone] for a physician referral.  Activity: As tolerated with Full fall precautions use walker/cane & assistance as needed    Diet: regular  Disposition Home

## 2018-09-27 NOTE — Progress Notes (Signed)
Occupational Therapy Treatment Patient Details Name: [name] MRN: [id number] DOB: 1946-03-21 Today's Date: 09/27/2018    History of present illness Patient is a 73 y/o female presenting to the ED on 09/21/2018 with primary complaints of fall with associated pain. CT scan noted multiple pelvic fractures, all of which were nonoperable, per chart review. 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    OT comments  daughter will be providing 24/7 A. Pt has a 3n1, and WC.  Education complete    Follow Up Recommendations  Home health OT;Supervision/Assistance - 24 hour(daugther able to provide care.)    Equipment Recommendations  3 in 1 bedside commode;Other (comment)(daughter will obtain a transport chair if WC doesnt work that they already have)    Recommendations for Other Services      Precautions / Restrictions Precautions Precautions: Fall Precaution Comments: L UE hemiplegia       Mobility Bed Mobility               General bed mobility comments: pt in WC  Transfers Overall transfer level: Needs assistance Equipment used: Rolling walker (2 wheeled) Transfers: Sit to/from [facility] Sit to Stand: Mod assist Stand pivot transfers: Mod assist Squat pivot transfers: Mod assist     General transfer comment: Daugther verbalized understanding and feels confident taking care of pt at home with [facility] ( she has), 3 n 1, and chair.  Daughter may obtain a lift chair    Balance   Sitting-balance support: Single extremity supported Sitting balance-Leahy Scale: Fair       Standing balance-Leahy Scale: Poor Standing balance comment: UE support and external assist needed                           ADL either performed or assessed with clinical judgement   ADL Overall ADL's : Needs  assistance/impaired                         Toilet Transfer: BSC;Squat-pivot;Cueing for safety;Cueing for sequencing;Moderate assistance Toilet Transfer Details (indicate cue type and reason): going to right Toileting- Clothing Manipulation and Hygiene: Maximal assistance;Sit to/from stand;Cueing for sequencing;Cueing for safety         General ADL Comments: Educated pt and daugther on pt standing at sink and daugther switching out BSC and WC.  This way pt is able to stand at sink holding with 2 hands and daughter is able perform hygiene and clothing management     Vision Baseline Vision/History: No visual deficits            Cognition Arousal/Alertness: Awake/alert Behavior During Therapy: WFL for tasks assessed/performed Overall Cognitive Status: Within Functional Limits for tasks assessed                                                General Comments  education provided regarding LUE AAROM, PROM and positoining.  Pt and daughter verbalized understanding    Pertinent Vitals/ Pain       Pain Score: 2  Faces Pain Scale: Hurts a little bit Pain Location: L hip/pelvis Pain Descriptors / Indicators: Discomfort;Grimacing;Guarding Pain Intervention(s): Limited activity within patient's tolerance;Repositioned  Frequency  Min 2X/week        Progress Toward Goals  OT Goals(current goals can now be found in the care plan section)  Progress towards OT goals: Progressing toward goals     Plan Discharge plan remains appropriate       AM-PAC OT "6 Clicks" Daily Activity     Outcome Measure   Help from another person eating meals?: A Little Help from another person taking care of personal grooming?: A Little Help from another person toileting, which includes using toliet, bedpan, or urinal?: Total Help from another person bathing (including washing, rinsing, drying)?: A Lot Help from another person to put on and taking off regular  upper body clothing?: A Lot Help from another person to put on and taking off regular lower body clothing?: Total 6 Click Score: 12    End of Session Equipment Utilized During Treatment: Gait belt  OT Visit Diagnosis: Other abnormalities of gait and mobility (R26.89);Pain;Hemiplegia and hemiparesis;History of falling (Z91.81);Repeated falls (R29.6) Hemiplegia - Right/Left: Left Hemiplegia - dominant/non-dominant: Non-Dominant Hemiplegia - caused by: Cerebral infarction Pain - Right/Left: Left Pain - part of body: Shoulder   Activity Tolerance Patient tolerated treatment well   Patient Left with call bell/phone within reach;with family/visitor present;Other (comment)(on BSC)   Nurse Communication Mobility status        Time: 1130-1155 OT Time Calculation (min): 25 min  Charges: OT General Charges $OT Visit: 1 Visit OT Treatments $Self Care/Home Management : 23-37 mins  Kari Baars, [place] [phone] Office- [phone], Edwena Felty D 09/27/2018, 2:04 PM

## 2018-09-27 NOTE — Progress Notes (Signed)
Physical Therapy Treatment Patient Details Name: [name] MRN: [id number] DOB: 08-19-1945 Today's Date: 09/27/2018    History of Present Illness Patient is a 73 y/o female presenting to the ED on 09/21/2018 with primary complaints of fall with associated pain. CT scan noted multiple pelvic fractures, all of which were nonoperable, per chart review. 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     PT Comments    Pt progressing toward goals; dtr participated in session today; they feel ready to d/c today;    Follow Up Recommendations  Home health PT;Supervision/Assistance - 24 hour     Equipment Recommendations  Other (comment)(mechanical lift)    Recommendations for Other Services       Precautions / Restrictions Precautions Precautions: Fall Precaution Comments: L UE hemiplegia Restrictions Weight Bearing Restrictions: Yes    Mobility  Bed Mobility Overal bed mobility: Needs Assistance Bed Mobility: Supine to Sit     Supine to sit: HOB elevated;Min assist(has adjustable bed at home)     General bed mobility comments: assist with  trunk and LEs, incr time  Transfers Overall transfer level: Needs assistance Equipment used: Rolling walker (2 wheeled) Transfers: Sit to/from [facility] Sit to Stand: Mod assist;+2 safety/equipment Stand pivot transfers: Min assist;+2 safety/equipment;Mod assist       General transfer comment: multi-modal cues for technique and safety; assist for anterior-superior wt shift, hand placement; dtr assisting; PT and rehab tech assisting with donning LB garments (started in bed and completed in standing)  Ambulation/Gait                 Stairs             Wheelchair Mobility    Modified Rankin (Stroke Patients Only)       Balance              Standing balance-Leahy Scale: Poor Standing balance comment: UE support and external assist needed                            Cognition Arousal/Alertness: Awake/alert Behavior During Therapy: WFL for tasks assessed/performed Overall Cognitive Status: Within Functional Limits for tasks assessed                                        Exercises      General Comments        Pertinent Vitals/Pain Pain Assessment: Faces Faces Pain Scale: Hurts little more Pain Location: L hip/pelvis Pain Descriptors / Indicators: Discomfort;Grimacing;Guarding Pain Intervention(s): Limited activity within patient's tolerance;Monitored during session;Repositioned    Home Living                      Prior Function            PT Goals (current goals can now be found in the care plan section) Acute Rehab PT Goals Patient Stated Goal: return home, regain independence, less pain PT Goal Formulation: With patient Time For Goal Achievement: 10/06/18 Potential to Achieve Goals: Fair Progress towards PT goals: Progressing toward goals    Frequency    Min 3X/week      PT Plan Current plan remains appropriate    Co-evaluation  AM-PAC PT "6 Clicks" Mobility   Outcome Measure  Help needed turning from your back to your side while in a flat bed without using bedrails?: A Lot Help needed moving from lying on your back to sitting on the side of a flat bed without using bedrails?: A Lot Help needed moving to and from a bed to a chair (including a wheelchair)?: A Lot Help needed standing up from a chair using your arms (e.g., wheelchair or bedside chair)?: A Lot Help needed to walk in hospital room?: Total Help needed climbing 3-5 steps with a railing? : Total 6 Click Score: 10    End of Session Equipment Utilized During Treatment: Gait belt Activity Tolerance: Patient tolerated treatment well Patient left: in chair;with call bell/phone  within reach;with family/visitor present Nurse Communication: Mobility status PT Visit Diagnosis: Other abnormalities of gait and mobility (R26.89);Muscle weakness (generalized) (M62.81)     Time: 0518-3358 PT Time Calculation (min) (ACUTE ONLY): 32 min  Charges:  $Therapeutic Activity: 23-37 mins                     Kenyon Ana, PT  Pager: [phone] Acute Rehab Dept [facility]): [phone]   09/27/2018    [facility] 09/27/2018, 10:48 AM

## 2018-09-27 NOTE — Discharge Summary (Signed)
Physician Discharge Summary  [name] OIB:[id number] DOB: 08/08/1945 DOA: 09/21/2018  PCP: Lucille Passy, MD  Admit date: 09/21/2018 Discharge date: 09/27/2018  Admitted From: home Disposition:  Home, patient declined SNF  Recommendations for Outpatient Follow-up:  1. Follow up with orthopedic surgery in 2 weeks   Home Health: PT, RN, aide Equipment/Devices: none  Discharge Condition: stable CODE STATUS: Full code Diet recommendation: regular   HPI: Per admitting MD, [name] is a 73 y.o. 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  She was trying to kill a spider 2 days ago when she lost her balance and fell landing on her left side.  Initially she was able to get up with minimal pain, however the following day, she started having severe pain to where she could barely walk.  The pain persisted into today and her family brought her into the emergency room. ED Course: In the emergency room, CT scan noted multiple pelvic fractures, all of which were nonoperable.  However attempts to ambulate the patient even with some pain medication were quite limited.  Hospitalist were called for further evaluation  Hospital Course: Principal Problem Pelvic fracture -orthopedic surgery was consulted and evaluated patient.  Recommend for now conservative management, physical therapy, pain control.  She was initially placed on IV morphine, work with physical therapy, and her mobility and pain gradually improved, she was able to be weaned off of intravenous pain medications with occasional oxycodone, Robaxin and Tylenol being enough to treat her pain.  Physical therapy did recommend SNF level care however patient declined and  chose to go home with family help.  She was discharged home in stable condition, with home health physical therapy, RN, aide as well as symptomatic management with oxycodone, muscle relaxants and tylenol  Active Problems Chronic kidney disease stage III -1.6-1.7, currently at baseline Left shoulder pain -X-ray negative for fractures, on lidocaine patch and K pad, improved.  Lidocaine patches prescribed on discharge Constipation -due to narcotics, improved after enema Diabetes mellitus -stable Fever due to bronchitis -Patient with URI type symptoms over the past week, febrile yesterday, concern atelectasis versus bronchitis as per chest x-ray.  She will be prescribed a short course of doxycycline History of CVA with left-sided weakness -Stable Rash lower back -Minimal, resolved Hypertension -Continue losartan, blood pressure on the high side likely due to pain.  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 ([place])   Overweight (BMI 25.0-29.9)   CKD (chronic kidney disease), stage III (HCC)   Pressure injury of skin   Discharge Instructions   Allergies as of 09/27/2018      Reactions   Clonidine Anaphylaxis, Swelling, Other (See Comments)   Tongue swelling   Norvasc [amlodipine Besylate] Swelling   Edema and TONGUE swelling   Lisinopril Cough   Amlodipine Anxiety, Nausea Only, Swelling   Codeine Itching, Rash      Medication List    STOP taking these medications   acetaminophen 500 MG tablet Commonly  known as:  TYLENOL Replaced by:  acetaminophen 650 MG CR tablet     TAKE these medications   acetaminophen 650 MG CR tablet Commonly known as:  TYLENOL Take 1 tablet (650 mg total) by mouth every 6  (six) hours as needed for pain. Replaces:  acetaminophen 500 MG tablet   aspirin EC 81 MG tablet Take 81 mg by mouth daily.   BLACK CURRANT SEED OIL PO Take 1 capsule by mouth daily.   blood glucose meter kit and supplies Kit Dispense based on patient and insurance preference. UAD bid for glucose monitoring Dx: E11.9   Blood Pressure Monitor Automat Devi UAD for BP monitoring; Dx: I10 & I16.0   doxycycline 100 MG tablet Commonly known as:  VIBRA-TABS Take 1 tablet (100 mg total) by mouth every 12 (twelve) hours.   fexofenadine 180 MG tablet Commonly known as:  ALLEGRA Take 180 mg by mouth daily.   fluticasone 50 MCG/ACT nasal spray Commonly known as:  FLONASE Place 2 sprays into both nostrils daily as needed for allergies.   glucose blood test strip UAD bid for glucose monitoring Dx: E11.9   Lancet Device Misc UAD bid for glucose monitoring Dx: E11.9   lidocaine 5 % Commonly known as:  Lidoderm Place 1 patch onto the skin every 12 (twelve) hours. Remove & Discard patch within 12 hours or as directed by MD   losartan 25 MG tablet Commonly known as:  COZAAR Take 1 tablet (25 mg total) by mouth daily.   methocarbamol 500 MG tablet Commonly known as:  ROBAXIN Take 1 tablet (500 mg total) by mouth every 8 (eight) hours as needed for muscle spasms.   oxyCODONE 5 MG immediate release tablet Commonly known as:  Oxy IR/ROXICODONE Take 1 tablet (5 mg total) by mouth every 6 (six) hours as needed for up to 5 days for moderate pain.      Follow-up Information    Marchia Bond, MD. Schedule an appointment as soon as possible for a visit in 2 week(s).   Specialty:  Orthopedic Surgery Contact information: [address] [place] [address] [phone]        Lucille Passy, MD Follow up.   Specialty:  Family Medicine Why:  as needed Contact information: [address] [place] [address] [place]  Follow up.   Why:  Home Health RN, Physical Therapy and aide-agency will call to arrange appointment Contact information: [phone]          Consultations:  Orthopedic surgery   Procedures/Studies:  Dg Chest 2 View  Result Date: 09/22/2018 CLINICAL DATA:  Per order- fever Pt HX: non smoker, HTN, DM EXAM: CHEST - 2 VIEW COMPARISON:  02/18/2018 FINDINGS: Cardiac silhouette is normal in size. No mediastinal or hilar masses. No evidence of adenopathy. Irregularly thickened bronchovascular interstitial markings are mildly increased compared to the most recent prior study. There are no focal areas of lung consolidation. No pleural effusion or pneumothorax. Skeletal structures are demineralized but intact. IMPRESSION: 1. Irregularly thickened bronchovascular interstitial markings, which have mildly increased when compared to the most recent prior study. In the setting of fever, active interstitial infection or inflammation superimposed on chronic changes is suspected. No focal lung consolidation to suggest lobar pneumonia. Electronically Signed   By: Lajean Manes M.D.   On: 09/22/2018 19:07   Dg Thoracic Spine 2 View  Result Date: 09/21/2018 CLINICAL DATA:  Back pain after  fall 2 days ago. EXAM: THORACIC SPINE 2 VIEWS COMPARISON:  Chest x-ray dated February 18, 2018. FINDINGS: Twelve rib-bearing thoracic vertebral bodies. The upper thoracic spine is not well visualized on the lateral view. No acute fracture or subluxation. Vertebral body heights are preserved. Alignment is normal. Intervertebral disc spaces are maintained. Osteopenia. IMPRESSION: Negative. Electronically Signed   By: Titus Dubin M.D.   On: 09/21/2018 10:20   Dg Lumbar Spine Complete  Result Date: 09/21/2018 CLINICAL DATA:  Back pain after fall 2 days ago. EXAM: LUMBAR SPINE - COMPLETE 4+ VIEW COMPARISON:  None. FINDINGS: Five lumbar type vertebral bodies. No acute fracture or subluxation. Vertebral body heights are  preserved. Alignment is normal. Intervertebral disc spaces are maintained. Osteopenia. Aortoiliac atherosclerosis. IMPRESSION: 1.  No acute osseous abnormality. Electronically Signed   By: Titus Dubin M.D.   On: 09/21/2018 10:18   Ct Pelvis Wo Contrast  Result Date: 09/21/2018 CLINICAL DATA:  [place] yesterday with pelvic and hip pain on the left. Abnormal radiographs. EXAM: CT PELVIS WITHOUT CONTRAST TECHNIQUE: Multidetector CT imaging of the pelvis was performed following the standard protocol without intravenous contrast. COMPARISON:  Radiography same day FINDINGS: Urinary Tract:  Normal Bowel:  Normal except for ordinary diverticulosis. Vascular/Lymphatic: Atherosclerotic change without aneurysm or acute hemorrhage. Reproductive:  Normal Other:  No free fluid or air. Musculoskeletal: Nondisplaced fracture of the left sacral ala. Nondisplaced fractures of the left superior pubic ramus and pubic symphysis region. No evidence of femur fracture. No right-sided pelvic injury seen. IMPRESSION: Nondisplaced fracture of the left sacral ala. Nondisplaced fractures of the left superior pubic ramus and symphyseal region. No significant soft tissue hematoma. Electronically Signed   By: Nelson Chimes M.D.   On: 09/21/2018 12:27   Dg Shoulder Left  Result Date: 09/21/2018 CLINICAL DATA:  Fall 2 days ago. EXAM: LEFT SHOULDER - 2+ VIEW COMPARISON:  None. FINDINGS: There is no evidence of fracture or dislocation. There is no evidence of arthropathy or other focal bone abnormality. Osteopenia. Soft tissues are unremarkable. IMPRESSION: Negative. Electronically Signed   By: Titus Dubin M.D.   On: 09/21/2018 10:13   Dg Hip Unilat With Pelvis 2-3 Views Left  Result Date: 09/21/2018 CLINICAL DATA:  Fall 2 days ago. EXAM: DG HIP (WITH OR WITHOUT PELVIS) 2-3V LEFT COMPARISON:  None. FINDINGS: Slight irregularity of the left superior pubic ramus, suspicious for nondisplaced fracture. No additional fracture. No  dislocation. The pubic symphysis and sacroiliac joints are intact. The hip joint spaces are preserved. Osteopenia. Soft tissues are unremarkable. IMPRESSION: 1. Suspected nondisplaced fracture of the left superior pubic ramus. Electronically Signed   By: Titus Dubin M.D.   On: 09/21/2018 10:17     Subjective: - no chest pain, shortness of breath, no abdominal pain, nausea or vomiting.   Discharge Exam: BP (!) 199/67 (BP Location: Right Arm)   Pulse 67   Temp (!) 97.5 F (36.4 C) (Oral)   Resp 16   Ht _0  (1.575 m)   Wt 68 kg   SpO2 95%   BMI 27.44 kg/m   General: Pt is alert, awake, not in acute distress Cardiovascular: RRR Respiratory: CTA bilaterally Skin: no rashes, pressure ulcers bilateral heels  The results of significant diagnostics from this hospitalization (including imaging, microbiology, ancillary and laboratory) are listed below for reference.     Microbiology: Recent Results (from the past 240 hour(s))  Culture, blood (Routine X 2) w Reflex to ID Panel     Status: None  Collection Time: 09/22/18  6:43 PM  Result Value Ref Range Status   Specimen Description   Final    BLOOD LEFT ANTECUBITAL Performed at [facility] [address], [place], [place] [address]    Special Requests   Final    BOTTLES DRAWN AEROBIC AND ANAEROBIC Blood Culture adequate volume Performed at [facility] [address], [place], [place] [address]    Culture   Final    NO GROWTH 5 DAYS Performed at [facility], [place] [address], [place], [place] [address]    Report Status 09/27/2018 FINAL  Final  Culture, blood (Routine X 2) w Reflex to ID Panel     Status: None   Collection Time: 09/22/18  6:47 PM  Result Value Ref Range Status   Specimen Description   Final    BLOOD RIGHT HAND Performed at [name] [address], [place], [place] [address]    Special Requests   Final    BOTTLES DRAWN AEROBIC AND  ANAEROBIC Blood Culture adequate volume Performed at [facility] [address] [place], [place] [address]    Culture   Final    NO GROWTH 5 DAYS Performed at [facility], [place] [address], [place],  [address]    Report Status 09/27/2018 FINAL  Final     Labs: BNP (last 3 results) No results for input(s): BNP in the last 8760 hours. Basic Metabolic Panel: Recent Labs  Lab 09/21/18 1310 09/22/18 0437 09/23/18 0415 09/25/18 0434  NA 140 140 137 135  K 3.8 4.2 4.0 3.7  CL 107 110 110 107  CO2 24 23 18* 21*  GLUCOSE 118* 109* 75 106*  BUN _0 CREATININE 1.58* 1.73* 1.76* 1.71*  CALCIUM 9.0 8.5* 8.5* 8.7*   Liver Function Tests: No results for input(s): AST, ALT, ALKPHOS, BILITOT, PROT, ALBUMIN in the last 168 hours. No results for input(s): LIPASE, AMYLASE in the last 168 hours. No results for input(s): AMMONIA in the last 168 hours. CBC: Recent Labs  Lab 09/21/18 1310 09/22/18 0437 09/23/18 0415 09/25/18 0434  WBC 10.3 8.8 8.0 6.9  NEUTROABS 7.3  --   --   --   HGB 12.4 11.7* 9.9* 9.3*  HCT 37.8 36.7 30.9* 29.1*  MCV 84.9 87.0 86.8 86.4  PLT 267 206 214 236   Cardiac Enzymes: No results for input(s): CKTOTAL, CKMB, CKMBINDEX, TROPONINI in the last 168 hours. BNP: Invalid input(s): POCBNP CBG: Recent Labs  Lab 09/26/18 0733 09/26/18 1211 09/26/18 1636 09/26/18 2159 09/27/18 0731  GLUCAP 132* 148* 103* 98 97   D-Dimer No results for input(s): DDIMER in the last 72 hours. Hgb A1c No results for input(s): HGBA1C in the last 72 hours. Lipid Profile No results for input(s): CHOL, HDL, LDLCALC, TRIG, CHOLHDL, LDLDIRECT in the last 72 hours. Thyroid function studies No results for input(s): TSH, T4TOTAL, T3FREE, THYROIDAB in the last 72 hours.  Invalid input(s): FREET3 Anemia work up No results for input(s): VITAMINB12, FOLATE, FERRITIN, TIBC, IRON, RETICCTPCT in the last 72 hours. Urinalysis    Component Value  Date/Time   COLORURINE STRAW (A) 09/23/2018 0411   APPEARANCEUR CLEAR 09/23/2018 0411   LABSPEC 1.005 09/23/2018 0411   PHURINE 6.0 09/23/2018 0411   GLUCOSEU NEGATIVE 09/23/2018 0411   HGBUR SMALL (A) 09/23/2018 0411   BILIRUBINUR NEGATIVE 09/23/2018 0411   KETONESUR NEGATIVE 09/23/2018 0411   PROTEINUR 30 (A) 09/23/2018 0411  NITRITE NEGATIVE 09/23/2018 0411   LEUKOCYTESUR NEGATIVE 09/23/2018 0411   Sepsis Labs Invalid input(s): PROCALCITONIN,  WBC,  LACTICIDVEN  FURTHER DISCHARGE INSTRUCTIONS:   Get Medicines reviewed and adjusted: Please take all your medications with you for your next visit with your Primary MD   Laboratory/radiological data: Please request your Primary MD to go over all hospital tests and procedure/radiological results at the follow up, please ask your Primary MD to get all Hospital records sent to his/her office.   In some cases, they will be blood work, cultures and biopsy results pending at the time of your discharge. Please request that your primary care M.D. goes through all the records of your hospital data and follows up on these results.   Also Note the following: If you experience worsening of your admission symptoms, develop shortness of breath, life threatening emergency, suicidal or homicidal thoughts you must seek medical attention immediately by calling 911 or calling your MD immediately  if symptoms less severe.   You must read complete instructions/literature along with all the possible adverse reactions/side effects for all the Medicines you take and that have been prescribed to you. Take any new Medicines after you have completely understood and accpet all the possible adverse reactions/side effects.    Do not drive when taking Pain medications or sleeping medications (Benzodaizepines)   Do not take more than prescribed Pain, Sleep and Anxiety Medications. It is not advisable to combine anxiety,sleep and pain medications without talking with  your primary care practitioner   Special Instructions: If you have smoked or chewed Tobacco  in the last 2 yrs please stop smoking, stop any regular Alcohol  and or any Recreational drug use.   Wear Seat belts while driving.   Please note: You were cared for by a hospitalist during your hospital stay. Once you are discharged, your primary care physician will handle any further medical issues. Please note that NO REFILLS for any discharge medications will be authorized once you are discharged, as it is imperative that you return to your primary care physician (or establish a relationship with a primary care physician if you do not have one) for your post hospital discharge needs so that they can reassess your need for medications and monitor your lab values.  Time coordinating discharge: 40 minutes  SIGNED:  Marzetta Board, PA-S 09/27/2018, 10:16 AM

## 2018-09-27 NOTE — Care Management Important Message (Signed)
Important Message  Patient Details  Name: [name] MRN: [id number] Date of Birth: 1945/11/18   Medicare Important Message Given:  Yes    Erenest Rasher, RN 09/27/2018, 11:19 AM

## 2018-09-29 ENCOUNTER — Encounter: Payer: Self-pay | Admitting: Family Medicine

## 2018-10-07 ENCOUNTER — Ambulatory Visit (INDEPENDENT_AMBULATORY_CARE_PROVIDER_SITE_OTHER): Payer: Medicare Other | Admitting: Family Medicine

## 2018-10-07 ENCOUNTER — Encounter: Payer: Self-pay | Admitting: Family Medicine

## 2018-10-07 VITALS — BP 160/68 | HR 78 | Temp 97.4°F

## 2018-10-07 DIAGNOSIS — L89601 Pressure ulcer of unspecified heel, stage 1: Secondary | ICD-10-CM | POA: Diagnosis not present

## 2018-10-07 DIAGNOSIS — I10 Essential (primary) hypertension: Secondary | ICD-10-CM | POA: Diagnosis not present

## 2018-10-07 DIAGNOSIS — S329XXD Fracture of unspecified parts of lumbosacral spine and pelvis, subsequent encounter for fracture with routine healing: Secondary | ICD-10-CM

## 2018-10-07 DIAGNOSIS — R5383 Other fatigue: Secondary | ICD-10-CM | POA: Diagnosis not present

## 2018-10-07 MED ORDER — DOXYCYCLINE HYCLATE 100 MG PO TABS: 100.0000 mg | ORAL_TABLET | Freq: Two times a day (BID) | ORAL | 0 refills | Status: DC

## 2018-10-07 MED ORDER — OXYCODONE HCL 5 MG PO CAPS: 5.0000 mg | ORAL_CAPSULE | ORAL | 0 refills | Status: DC | PRN

## 2018-10-07 MED ORDER — ACETAMINOPHEN ER 650 MG PO TBCR: 650.0000 mg | EXTENDED_RELEASE_TABLET | Freq: Four times a day (QID) | ORAL | 3 refills | Status: AC | PRN

## 2018-10-07 NOTE — Assessment & Plan Note (Addendum)
Was able to see pressure ulcers on webex. Stage one. Advised elevating pressure off of her heels. Call or send my chart message prn if these symptoms worsen or fail to improve as anticipated. The patient indicates understanding of these issues and agrees with the plan. Doxycycline 100 mg twice daily x 10 given that it looks like it may be draining.  Afebrile.

## 2018-10-07 NOTE — Assessment & Plan Note (Signed)
Will have home health nurse check CBC with diff, VIt d, Vit B12, etc.  Awaiting to hear from her which lab to send to.

## 2018-10-07 NOTE — Progress Notes (Signed)
Virtual Visit via Video   I connected with [name] on 10/07/18 at 11:40 AM EDT by a video enabled telemedicine application and verified that I am speaking with the correct person using two identifiers. Location patient: Home Location provider: [facility], Office Persons participating in the virtual visit: Victory Dakin, MD   I discussed the limitations of evaluation and management by telemedicine and the availability of in person appointments. The patient expressed understanding and agreed to proceed.  Subjective:   HPI:   Pt had a fall and broke pelvis and Fx left hip. She left hospital 3.16.20-3.22.20. Notes reviewed.  Pelvic fracture -orthopedic surgery was consulted and evaluated patient.  Recommend for now conservative management, physical therapy, pain control.  She was initially placed on IV morphine, work with physical therapy, and her mobility and pain gradually improved, she was able to be weaned off of intravenous pain medications.  She is taking with occasional oxycodone (trying not to), and scheduling 650 mg Tylenol every 6 hour being enough to treat her pain.  Physical therapy did recommend SNF level care however patient declined and chose to go home with family help.  She was discharged home in stable condition, with home health physical therapy, RN, aide as well as symptomatic management with oxycodone, muscle relaxants and tylenol  Since leaving the hospital she has been hunger constantly and lethargy x2d.   Daughter is concerned about HGB dropping from 12 to 9.9 during hospital stay.   Lab Results  Component Value Date   WBC 6.9 09/25/2018   HGB 9.3 (L) 09/25/2018   HCT 29.1 (L) 09/25/2018   MCV 86.4 09/25/2018   PLT 236 09/25/2018     BM's are normal a brown. Urinating normally.  She has 2 blisters on the bottom of her feet. She is keeping them clean and wrapped until [facility] comes in.   Pressure ulcers on both feet. ROS: See  pertinent positives and negatives per HPI.  Review of Systems  Constitutional: Positive for appetite change and fatigue.  Gastrointestinal: Negative for anal bleeding and blood in stool.  Genitourinary: Negative.   Skin: Positive for wound.  All other systems reviewed and are negative.    Patient Active Problem List   Diagnosis Date Noted  . Pressure injury of skin 09/27/2018  . Pelvic fracture ([place]) 09/21/2018  . Allergic reaction caused by a drug 05/06/2018  . Bilateral leg edema 04/13/2018  . HLD (hyperlipidemia) 03/23/2018  . Mood swings 03/23/2018  . Chronic kidney disease (CKD), stage III (moderate) (HCC)   . Embolic stroke ([place]) 62/95/2841  . Left hemiparesis ([place])   . Diabetes mellitus type 2 in nonobese (HCC)   . Acute ischemic stroke ([place])   . Left arm weakness   . Acute CVA (cerebrovascular accident) ([place]) 02/24/2018  . 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 Overweight (BMI 25.0-29.9) 02/24/2018  . CKD (chronic kidney disease), stage III ([place]) 02/24/2018  . Diabetes mellitus without complication ([place]) 32/44/0102  . HTN (hypertension) 09/02/2017  . Knee pain, bilateral 09/02/2017    Social History   Tobacco Use  . Smoking status: Never Smoker  . Smokeless tobacco: Never Used  Substance Use Topics  . Alcohol use: No    Current Outpatient Medications:  .  acetaminophen (TYLENOL) 650 MG CR tablet, Take 1 tablet (650 mg total) by mouth every 6 (six) hours as needed for pain., Disp: 30 tablet, Rfl: 0 .  aspirin  EC 81 MG tablet, Take 81 mg by mouth daily., Disp: , Rfl:  .  BLACK CURRANT SEED OIL PO, Take 1 capsule by mouth daily., Disp: , Rfl:  .  blood glucose meter kit and supplies KIT, Dispense based on patient and insurance preference. UAD bid for glucose monitoring Dx: E11.9, Disp: 1 each, Rfl: 0 .  glucose blood test strip, UAD bid for glucose monitoring Dx: E11.9, Disp: 100 each, Rfl: 12 .  Lancet Device MISC, UAD bid for glucose  monitoring Dx: E11.9, Disp: 1 each, Rfl: prn .  losartan (COZAAR) 25 MG tablet, Take 1 tablet (25 mg total) by mouth daily., Disp: 30 tablet, Rfl: 1 .  fexofenadine (ALLEGRA) 180 MG tablet, Take 180 mg by mouth daily., Disp: , Rfl:  .  fluticasone (FLONASE) 50 MCG/ACT nasal spray, Place 2 sprays into both nostrils daily as needed for allergies. , Disp: , Rfl:  .  lidocaine (LIDODERM) 5 %, Place 1 patch onto the skin every 12 (twelve) hours. Remove & Discard patch within 12 hours or as directed by MD (Patient not taking: Reported on 10/07/2018), Disp: 60 patch, Rfl: 11 .  methocarbamol (ROBAXIN) 500 MG tablet, Take 1 tablet (500 mg total) by mouth every 8 (eight) hours as needed for muscle spasms. (Patient not taking: Reported on 10/07/2018), Disp: 20 tablet, Rfl: 0  Allergies  Allergen Reactions  . Clonidine Anaphylaxis, Swelling and Other (See Comments)    Tongue swelling  . Norvasc [Amlodipine Besylate] Swelling    Edema and TONGUE swelling   . Lisinopril Cough  . Amlodipine Anxiety, Nausea Only and Swelling  . Codeine Itching and Rash    Objective:   BP (!) 160/68 (Cuff Size: Normal)   Pulse 78   Temp (!) 97.4 F (36.3 C) (Oral)   VITALS: Per patient if applicable, see vitals. Color looks good GENERAL: Alert, appears well and in no acute distress. HEENT: Atraumatic, conjunctiva clear, no obvious abnormalities on inspection of external nose and ears. NECK: Normal movements of the head and neck. CARDIOPULMONARY: No increased WOB. Speaking in clear sentences. I:E ratio WNL.  MS: Moves all visible extremities without noticeable abnormality. PSYCH: Pleasant and cooperative, well-groomed. Speech normal rate and rhythm. Affect is appropriate. Insight and judgement are appropriate. Attention is focused, linear, and appropriate.  NEURO: CN grossly intact. Oriented as arrived to appointment on time with no prompting. Moves both UE equally.  SKIN: No obvious lesions, wounds, erythema, or  cyanosis noted on face or hands.  Assessment and Plan:   There are no diagnoses linked to this encounter.  . Reviewed expectations re: course of current medical issues. . Discussed self-management of symptoms. . Outlined signs and symptoms indicating need for more acute intervention. . Patient verbalized understanding and all questions were answered. [facility] Health Maintenance issues including appropriate healthy diet, exercise, and smoking avoidance were discussed with patient. . See orders for this visit as documented in the electronic medical record.  Arnette Norris, MD 10/07/2018

## 2018-10-07 NOTE — Assessment & Plan Note (Signed)
Continue scheduled tylenol.  Refill sent. Also refilled oxycodone but she is already using this only once a week or so.  Discussed sedation precautions.

## 2018-10-07 NOTE — Assessment & Plan Note (Signed)
Has been well controlled.  Daughter thinks it is due to pain.  On low dose cozaar. Trying not to take oxycodone.

## 2018-10-08 ENCOUNTER — Encounter: Payer: Self-pay | Admitting: Family Medicine

## 2018-10-08 ENCOUNTER — Telehealth: Payer: Self-pay | Admitting: Family Medicine

## 2018-10-08 NOTE — Telephone Encounter (Signed)
Copied from [place] [id number]. Topic: Quick Communication - Home Health Verbal Orders >> Oct 08, 2018  3:59 PM Nils Flack wrote: Caller[facility]Agency[facility]  Callback Number: [phone] Requesting OT/PT/Skilled Nursing/Social Work/Speech Therapy: nursing 1 week 1, 2 week 2, 1 week 2, 2 prn med mgmt, disease process teaching  Wound care - frequency? Stage 2 pressure ulcer on l  heel, unstagable on r heel with eschar.  She would like  L heel - cleanse with saline, apply durafiber to wound bed, gauze,  R heel - clean with saline, santle the eschar area, cover with sailine moistened gauze, cover with dry sterile gauze,  Changed daily   Frequency:

## 2018-10-09 MED ORDER — COLLAGENASE 250 UNIT/GM EX OINT: 1.0000 "application " | TOPICAL_OINTMENT | Freq: Every day | CUTANEOUS | 0 refills | Status: DC

## 2018-10-09 NOTE — Telephone Encounter (Signed)
Verbal given to [place].

## 2018-10-09 NOTE — Telephone Encounter (Signed)
Okay for verbal order

## 2018-10-09 NOTE — Telephone Encounter (Signed)
lmovm for [name] to call back.

## 2018-10-09 NOTE — Telephone Encounter (Signed)
Okay for verbal 

## 2018-10-12 ENCOUNTER — Other Ambulatory Visit: Payer: Self-pay

## 2018-10-12 ENCOUNTER — Encounter: Payer: Self-pay | Admitting: Family Medicine

## 2018-10-12 ENCOUNTER — Telehealth: Payer: Self-pay | Admitting: Family Medicine

## 2018-10-12 MED ORDER — COLLAGENASE 250 UNIT/GM EX OINT: 1.0000 "application " | TOPICAL_OINTMENT | Freq: Every day | CUTANEOUS | 0 refills | Status: DC

## 2018-10-12 NOTE — Telephone Encounter (Signed)
Copied from [place] [id number]. Topic: Quick Communication - See Telephone Encounter >> Oct 12, 2018 12:59 PM Blase Mess A wrote: CRM for notification. See Telephone encounter for: 10/12/18. Patient is calling because she had a stroke last year and she would like to know if Dr. Derrel Nip would do a  botox injection.  The reason is because the patient is having diffuculty moving your left arm and is having a lot of pain. Please advise. [phone] Jerilynn Mages)

## 2018-10-12 NOTE — Telephone Encounter (Signed)
Pt's daughter was informed that Dr. Derrel Nip does not do botox injections. Pt gave a verbal understanding.

## 2018-10-13 ENCOUNTER — Telehealth: Payer: Self-pay | Admitting: *Deleted

## 2018-10-13 NOTE — Telephone Encounter (Signed)
[name] was on inpatient rehab d/c on  03/13/18.  She was to follow up with Dr Letta Pate.  (no office visit done).  She has been seeing rehab therapies and they have suggested botox as a possibility for her shoulder pain.  Please advise.

## 2018-10-13 NOTE — Telephone Encounter (Signed)
Please make appt for 6 wks for eval

## 2018-10-14 ENCOUNTER — Telehealth: Payer: Self-pay | Admitting: Family Medicine

## 2018-10-14 NOTE — Telephone Encounter (Signed)
Copied from [place] [phone] [id number]. Topic: Quick Communication - Home Health Verbal Orders >> Oct 14, 2018 12:28 PM Leward Quan A wrote: Caller/Agency: Ronalee Belts / [place] Number: [phone] [place] to [facility] Requesting OT/PT/Skilled Nursing/Social Work/Speech Therapy: Occupational Therapy Frequency: 2 times week for 4 weeks for left shoulder dysfunction

## 2018-10-15 NOTE — Telephone Encounter (Signed)
TA-Is a verbal ok for this pt to receive OT/PT/SN/ST/OT 2x week for 4 weeks for left shoulder dysfunction?plz advise/thx dmf

## 2018-10-15 NOTE — Telephone Encounter (Signed)
LMOVM giving verbal for multiple therapies per TA/thx dmf

## 2018-10-19 ENCOUNTER — Telehealth: Payer: Self-pay | Admitting: Podiatrist

## 2018-10-20 ENCOUNTER — Encounter: Payer: Self-pay | Admitting: Sports Medicine

## 2018-10-20 ENCOUNTER — Other Ambulatory Visit: Payer: Self-pay

## 2018-10-20 ENCOUNTER — Telehealth: Payer: Self-pay | Admitting: Family Medicine

## 2018-10-20 ENCOUNTER — Ambulatory Visit (INDEPENDENT_AMBULATORY_CARE_PROVIDER_SITE_OTHER): Payer: Medicare Other | Admitting: Sports Medicine

## 2018-10-20 VITALS — Temp 98.1°F

## 2018-10-20 DIAGNOSIS — L89612 Pressure ulcer of right heel, stage 2: Secondary | ICD-10-CM

## 2018-10-20 DIAGNOSIS — L02619 Cutaneous abscess of unspecified foot: Secondary | ICD-10-CM

## 2018-10-20 DIAGNOSIS — L89622 Pressure ulcer of left heel, stage 2: Secondary | ICD-10-CM

## 2018-10-20 DIAGNOSIS — M79671 Pain in right foot: Secondary | ICD-10-CM

## 2018-10-20 DIAGNOSIS — M79672 Pain in left foot: Secondary | ICD-10-CM

## 2018-10-20 NOTE — Telephone Encounter (Signed)
TA-Plz see message below/pt declined Home Health Services/thx dmf

## 2018-10-20 NOTE — Progress Notes (Signed)
Subjective: [name] is a 73 y.o. female patient seen in office for evaluation of ulceration of the heels. Patient is assisted by her daughter who helps to report history of her mom developing these wounds after being inpatient at [facility] for a broken pelvic bone after being there 3 weeks ago. For the last 2 weeks her daughter has been applying honey to the heel wounds with some improvement. Patient has a history of pre-diabetes now.  Patient is currently on Doxycycline. Denies nausea/fever/vomiting/chills/night sweats/shortness of breath/pain. Patient has no other pedal complaints at this time.  Patient Active Problem List   Diagnosis Date Noted  . Lethargy 10/07/2018  . Pressure injury of skin 09/27/2018  . Pelvic fracture ([place]) 09/21/2018  . Allergic reaction caused by a drug 05/06/2018  . Bilateral leg edema 04/13/2018  . HLD (hyperlipidemia) 03/23/2018  . Mood swings 03/23/2018  . Chronic kidney disease (CKD), stage III (moderate) (HCC)   . Embolic stroke ([place]) 71/24/5809  . Left hemiparesis ([place])   . Diabetes mellitus type 2 in nonobese (HCC)   . Acute ischemic stroke ([place])   . Left arm weakness   . Acute CVA (cerebrovascular accident) ([place]) 02/24/2018  . 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 Overweight (BMI 25.0-29.9) 02/24/2018  . CKD (chronic kidney disease), stage III ([place]) 02/24/2018  . Diabetes mellitus without complication ([place]) 98/33/8250  . HTN (hypertension) 09/02/2017  . Knee pain, bilateral 09/02/2017   Current Outpatient Medications on File Prior to Visit  Medication Sig Dispense Refill  . acetaminophen (TYLENOL) 650 MG CR tablet Take 1 tablet (650 mg total) by mouth every 6 (six) hours as needed for pain. 120 tablet 3  . aspirin EC 81 MG tablet Take 81 mg by mouth daily.    Marland Kitchen BLACK CURRANT SEED OIL PO Take 1 capsule by mouth daily.    . blood glucose meter kit and supplies KIT Dispense based on patient and insurance preference. UAD  bid for glucose monitoring Dx: E11.9 1 each 0  . collagenase (SANTYL) ointment Apply 1 application topically daily. 15 g 0  . doxycycline (VIBRA-TABS) 100 MG tablet Take 1 tablet (100 mg total) by mouth 2 (two) times daily. 20 tablet 0  . fexofenadine (ALLEGRA) 180 MG tablet Take 180 mg by mouth daily.    . fluticasone (FLONASE) 50 MCG/ACT nasal spray Place 2 sprays into both nostrils daily as needed for allergies.     Marland Kitchen glucose blood test strip UAD bid for glucose monitoring Dx: E11.9 100 each 12  . Lancet Device MISC UAD bid for glucose monitoring Dx: E11.9 1 each prn  . lidocaine (LIDODERM) 5 % Place 1 patch onto the skin every 12 (twelve) hours. Remove & Discard patch within 12 hours or as directed by MD (Patient not taking: Reported on 10/07/2018) 60 patch 11  . losartan (COZAAR) 25 MG tablet Take 1 tablet (25 mg total) by mouth daily. 30 tablet 1  . methocarbamol (ROBAXIN) 500 MG tablet Take 1 tablet (500 mg total) by mouth every 8 (eight) hours as needed for muscle spasms. (Patient not taking: Reported on 10/07/2018) 20 tablet 0  . oxycodone (OXY-IR) 5 MG capsule Take 1 capsule (5 mg total) by mouth every 4 (four) hours as needed. 30 capsule 0   No current facility-administered medications on file prior to visit.    Allergies  Allergen Reactions  . Clonidine Anaphylaxis, Swelling and Other (See Comments)    Tongue swelling  . Norvasc [  Amlodipine Besylate] Swelling    Edema and TONGUE swelling   . Lisinopril Cough  . Amlodipine Anxiety, Nausea Only and Swelling  . Codeine Itching and Rash    Recent Results (from the past 2160 hour(s))  CBC with Differential     Status: None   Collection Time: 09/21/18  1:10 PM  Result Value Ref Range   WBC 10.3 4.0 - 10.5 K/uL   RBC 4.45 3.87 - 5.11 MIL/uL   Hemoglobin 12.4 12.0 - 15.0 g/dL   HCT 37.8 36.0 - 46.0 %   MCV 84.9 80.0 - 100.0 fL   MCH 27.9 26.0 - 34.0 pg   MCHC 32.8 30.0 - 36.0 g/dL   RDW 13.4 11.5 - 15.5 %   Platelets 267 150 -  400 K/uL   nRBC 0.0 0.0 - 0.2 %   Neutrophils Relative % 72 %   Neutro Abs 7.3 1.7 - 7.7 K/uL   Lymphocytes Relative 13 %   Lymphs Abs 1.4 0.7 - 4.0 K/uL   Monocytes Relative 9 %   Monocytes Absolute 0.9 0.1 - 1.0 K/uL   Eosinophils Relative 5 %   Eosinophils Absolute 0.5 0.0 - 0.5 K/uL   Basophils Relative 1 %   Basophils Absolute 0.1 0.0 - 0.1 K/uL   Immature Granulocytes 0 %   Abs Immature Granulocytes 0.03 0.00 - 0.07 K/uL    Comment: Performed at [facility], [place] [address], [place], [place] [address]  Basic metabolic panel     Status: Abnormal   Collection Time: 09/21/18  1:10 PM  Result Value Ref Range   Sodium 140 135 - 145 mmol/L   Potassium 3.8 3.5 - 5.1 mmol/L   Chloride 107 98 - 111 mmol/L   CO2 24 22 - 32 mmol/L   Glucose, Bld 118 (H) 70 - 99 mg/dL   BUN 21 8 - 23 mg/dL   Creatinine, Ser 1.58 (H) 0.44 - 1.00 mg/dL   Calcium 9.0 8.9 - 10.3 mg/dL   GFR calc non Af Amer 32 (L) >60 mL/min   GFR calc Af Amer 37 (L) >60 mL/min   Anion gap 9 5 - 15    Comment: Performed at [facility]: [place], [place] [address] [place], [place] [address]  Glucose, capillary     Status: None   Collection Time: 09/21/18  6:43 PM  Result Value Ref Range   Glucose-Capillary 92 70 - 99 mg/dL  Glucose, capillary     Status: Abnormal   Collection Time: 09/21/18 10:08 PM  Result Value Ref Range   Glucose-Capillary 114 (H) 70 - 99 mg/dL  Basic metabolic panel     Status: Abnormal   Collection Time: 09/22/18  4:37 AM  Result Value Ref Range   Sodium 140 135 - 145 mmol/L   Potassium 4.2 3.5 - 5.1 mmol/L   Chloride 110 98 - 111 mmol/L   CO2 23 22 - 32 mmol/L   Glucose, Bld 109 (H) 70 - 99 mg/dL   BUN 22 8 - 23 mg/dL   Creatinine, Ser 1.73 (H) 0.44 - 1.00 mg/dL   Calcium 8.5 (L) 8.9 - 10.3 mg/dL   GFR calc non Af Amer 29 (L) >60 mL/min   GFR calc Af Amer 33 (L) >60 mL/min   Anion gap 7 5 - 15    Comment: Performed at [facility], [place]  [address] [place], [place] [address]  CBC     Status: Abnormal   Collection Time:  09/22/18  4:37 AM  Result Value Ref Range   WBC 8.8 4.0 - 10.5 K/uL   RBC 4.22 3.87 - 5.11 MIL/uL   Hemoglobin 11.7 (L) 12.0 - 15.0 g/dL   HCT 36.7 36.0 - 46.0 %   MCV 87.0 80.0 - 100.0 fL   MCH 27.7 26.0 - 34.0 pg   MCHC 31.9 30.0 - 36.0 g/dL   RDW 13.6 11.5 - 15.5 %   Platelets 206 150 - 400 K/uL   nRBC 0.0 0.0 - 0.2 %    Comment: Performed at [facility], [place] [address], [name], [place] [address]  Hemoglobin A1c     Status: Abnormal   Collection Time: 09/22/18  4:37 AM  Result Value Ref Range   Hgb A1c MFr Bld 6.2 (H) 4.8 - 5.6 %    Comment: (NOTE) Pre diabetes:          5.7%-6.4% Diabetes:              >6.4% Glycemic control for   <7.0% adults with diabetes    Mean Plasma Glucose 131.24 mg/dL    Comment: Performed at [facility] [address], [place], [place] [address]  Glucose, capillary     Status: Abnormal   Collection Time: 09/22/18  7:13 AM  Result Value Ref Range   Glucose-Capillary 107 (H) 70 - 99 mg/dL  Glucose, capillary     Status: Abnormal   Collection Time: 09/22/18 11:56 AM  Result Value Ref Range   Glucose-Capillary 109 (H) 70 - 99 mg/dL  Glucose, capillary     Status: Abnormal   Collection Time: 09/22/18  4:32 PM  Result Value Ref Range   Glucose-Capillary 106 (H) 70 - 99 mg/dL  Culture, blood (Routine X 2) w Reflex to ID Panel     Status: None   Collection Time: 09/22/18  6:43 PM  Result Value Ref Range   Specimen Description      BLOOD LEFT ANTECUBITAL Performed at [facility], [place] [address], [place], [place] [address]    Special Requests      BOTTLES DRAWN AEROBIC AND ANAEROBIC Blood Culture adequate volume Performed at [place] [address] [place], [place] [address]    Culture      NO GROWTH 5 DAYS Performed at [facility], [place] [address], [place], [place] [address]    Report  Status 09/27/2018 FINAL   Culture, blood (Routine X 2) w Reflex to ID Panel     Status: None   Collection Time: 09/22/18  6:47 PM  Result Value Ref Range   Specimen Description      BLOOD RIGHT HAND Performed at [facility] [address], [place], [place] [address]    Special Requests      BOTTLES DRAWN AEROBIC AND ANAEROBIC Blood Culture adequate volume Performed at [facility] [address], [place], [place] [address]    Culture      NO GROWTH 5 DAYS Performed at [facility], [place] [address] [place],  [address]    Report Status 09/27/2018 FINAL   Glucose, capillary     Status: None   Collection Time: 09/22/18 10:22 PM  Result Value Ref Range   Glucose-Capillary 81 70 - 99 mg/dL  Urinalysis, Routine w reflex microscopic     Status: Abnormal   Collection Time: 09/23/18  4:11 AM  Result Value Ref Range   Color, Urine  STRAW (A) YELLOW   APPearance CLEAR CLEAR   Specific Gravity, Urine 1.005 1.005 - 1.030   pH 6.0 5.0 - 8.0   Glucose, UA NEGATIVE NEGATIVE mg/dL   Hgb urine dipstick SMALL (A) NEGATIVE   Bilirubin Urine NEGATIVE NEGATIVE   Ketones, ur NEGATIVE NEGATIVE mg/dL   Protein, ur 30 (A) NEGATIVE mg/dL   Nitrite NEGATIVE NEGATIVE   Leukocytes,Ua NEGATIVE NEGATIVE   RBC / HPF 0-5 0 - 5 RBC/hpf   WBC, UA 0-5 0 - 5 WBC/hpf   Bacteria, UA NONE SEEN NONE SEEN   Squamous Epithelial / LPF 0-5 0 - 5   Mucus PRESENT     Comment: Performed at [facility], [place] [address], [place], [place] [address]  CBC     Status: Abnormal   Collection Time: 09/23/18  4:15 AM  Result Value Ref Range   WBC 8.0 4.0 - 10.5 K/uL   RBC 3.56 (L) 3.87 - 5.11 MIL/uL   Hemoglobin 9.9 (L) 12.0 - 15.0 g/dL   HCT 30.9 (L) 36.0 - 46.0 %   MCV 86.8 80.0 - 100.0 fL   MCH 27.8 26.0 - 34.0 pg   MCHC 32.0 30.0 - 36.0 g/dL   RDW 13.4 11.5 - 15.5 %   Platelets 214 150 - 400 K/uL   nRBC 0.0 0.0 - 0.2 %    Comment: Performed at  [facility], [place] [address] [place], [place] [address]  Basic metabolic panel     Status: Abnormal   Collection Time: 09/23/18  4:15 AM  Result Value Ref Range   Sodium 137 135 - 145 mmol/L   Potassium 4.0 3.5 - 5.1 mmol/L   Chloride 110 98 - 111 mmol/L   CO2 18 (L) 22 - 32 mmol/L   Glucose, Bld 75 70 - 99 mg/dL   BUN 22 8 - 23 mg/dL   Creatinine, Ser 1.76 (H) 0.44 - 1.00 mg/dL   Calcium 8.5 (L) 8.9 - 10.3 mg/dL   GFR calc non Af Amer 28 (L) >60 mL/min   GFR calc Af Amer 33 (L) >60 mL/min   Anion gap 9 5 - 15    Comment: Performed at [facility], [place] [address], [place], [place] [address]  Glucose, capillary     Status: None   Collection Time: 09/23/18  8:11 AM  Result Value Ref Range   Glucose-Capillary 75 70 - 99 mg/dL  Glucose, capillary     Status: None   Collection Time: 09/23/18 11:57 AM  Result Value Ref Range   Glucose-Capillary 94 70 - 99 mg/dL  Glucose, capillary     Status: None   Collection Time: 09/23/18  4:44 PM  Result Value Ref Range   Glucose-Capillary 98 70 - 99 mg/dL  Glucose, capillary     Status: Abnormal   Collection Time: 09/23/18 10:13 PM  Result Value Ref Range   Glucose-Capillary 116 (H) 70 - 99 mg/dL  Glucose, capillary     Status: None   Collection Time: 09/24/18  7:54 AM  Result Value Ref Range   Glucose-Capillary 83 70 - 99 mg/dL  Glucose, capillary     Status: Abnormal   Collection Time: 09/24/18 11:53 AM  Result Value Ref Range   Glucose-Capillary 123 (H) 70 - 99 mg/dL  Glucose, capillary     Status: None   Collection Time: 09/24/18  4:17 PM  Result Value Ref Range   Glucose-Capillary 98 70 - 99 mg/dL  Glucose,  capillary     Status: Abnormal   Collection Time: 09/24/18  8:21 PM  Result Value Ref Range   Glucose-Capillary 118 (H) 70 - 99 mg/dL  Basic metabolic panel     Status: Abnormal   Collection Time: 09/25/18  4:34 AM  Result Value Ref Range   Sodium 135 135 - 145 mmol/L   Potassium 3.7  3.5 - 5.1 mmol/L   Chloride 107 98 - 111 mmol/L   CO2 21 (L) 22 - 32 mmol/L   Glucose, Bld 106 (H) 70 - 99 mg/dL   BUN 22 8 - 23 mg/dL   Creatinine, Ser 1.71 (H) 0.44 - 1.00 mg/dL   Calcium 8.7 (L) 8.9 - 10.3 mg/dL   GFR calc non Af Amer 29 (L) >60 mL/min   GFR calc Af Amer 34 (L) >60 mL/min   Anion gap 7 5 - 15    Comment: Performed at [facility], [place] [address], [place], [place] [address]  CBC     Status: Abnormal   Collection Time: 09/25/18  4:34 AM  Result Value Ref Range   WBC 6.9 4.0 - 10.5 K/uL   RBC 3.37 (L) 3.87 - 5.11 MIL/uL   Hemoglobin 9.3 (L) 12.0 - 15.0 g/dL   HCT 29.1 (L) 36.0 - 46.0 %   MCV 86.4 80.0 - 100.0 fL   MCH 27.6 26.0 - 34.0 pg   MCHC 32.0 30.0 - 36.0 g/dL   RDW 13.1 11.5 - 15.5 %   Platelets 236 150 - 400 K/uL   nRBC 0.0 0.0 - 0.2 %    Comment: Performed at [facility], [place] [address], [place], [place] [address]  Glucose, capillary     Status: None   Collection Time: 09/25/18  7:36 AM  Result Value Ref Range   Glucose-Capillary 94 70 - 99 mg/dL  Glucose, capillary     Status: Abnormal   Collection Time: 09/25/18 11:53 AM  Result Value Ref Range   Glucose-Capillary 135 (H) 70 - 99 mg/dL  Glucose, capillary     Status: Abnormal   Collection Time: 09/25/18  4:55 PM  Result Value Ref Range   Glucose-Capillary 102 (H) 70 - 99 mg/dL  Glucose, capillary     Status: Abnormal   Collection Time: 09/25/18  9:28 PM  Result Value Ref Range   Glucose-Capillary 128 (H) 70 - 99 mg/dL  Glucose, capillary     Status: Abnormal   Collection Time: 09/26/18  7:33 AM  Result Value Ref Range   Glucose-Capillary 132 (H) 70 - 99 mg/dL  Glucose, capillary     Status: Abnormal   Collection Time: 09/26/18 12:11 PM  Result Value Ref Range   Glucose-Capillary 148 (H) 70 - 99 mg/dL  Glucose, capillary     Status: Abnormal   Collection Time: 09/26/18  4:36 PM  Result Value Ref Range   Glucose-Capillary 103 (H) 70 - 99 mg/dL   Glucose, capillary     Status: None   Collection Time: 09/26/18  9:59 PM  Result Value Ref Range   Glucose-Capillary 98 70 - 99 mg/dL  Glucose, capillary     Status: None   Collection Time: 09/27/18  7:31 AM  Result Value Ref Range   Glucose-Capillary 97 70 - 99 mg/dL    Objective: There were no vitals filed for this visit.  General: Patient is awake, alert, oriented x 3 and in no acute distress.  Dermatology: Skin is warm and dry bilateral with  a partial thickness ulceration present  Plantar heels bilateral. Ulceration measures on the left 2x1cm and on the right 3x2cm. There is a  Mildly keratotic border with a 50% granular and escharotic base. The ulceration does not  probe to bone. There is no malodor, no active drainage, no erythema, no edema. No acute signs of infection.   Vascular: Dorsalis Pedis pulse = 1/4 Bilateral,  Posterior Tibial pulse = 1/4 Bilateral,  Capillary Fill Time < 5 seconds  Neurologic: Gross sensation intact bilateral via light touch.   Musculosketal: There is mild pain with palpation to ulcerated areas. No pain with compression to calves bilateral. No gross bony deformities noted bilateral.  No results for input(s): GRAMSTAIN, LABORGA in the last 8760 hours.  Assessment and Plan:  Problem List Items Addressed This Visit    None    Visit Diagnoses    Decubitus ulcer of heel, bilateral, stage 2 ([place])    -  Primary   Abscess of foot       Relevant Orders   WOUND CULTURE   WOUND CULTURE   Heel pain, bilateral         -Examined patient and discussed the progression of the wound and treatment alternatives. -Cleansed ulcerations and wound culture was obtained from both heels. Will call patient if she needs to continue or change antibiotics -Advised daughter that the wounds were not wounds that I recommend for debridement at this time; these wounds require proper medication and offloading  -Applied medihoney and dry sterile dressing and instructed  patient to continue with daily dressings at home consisting of the same until santyl is received - Advised patient to go to the ER or return to office if the wound worsens or if constitutional symptoms are present. -Patient to return to office in 2 weeks for follow up care and evaluation or sooner if problems arise.  Landis Martins, DPM

## 2018-10-20 NOTE — Telephone Encounter (Signed)
Copied from [place] [id number]. Topic: Quick Communication - See Telephone Encounter >> Oct 20, 2018  3:17 PM Robina Ade, Helene Kelp D wrote: CRM for notification. See Telephone encounter for: 10/20/18.  Val[facility] with [facility] called to let Dr. Deborra Medina that patient decline home health service on 10/08/18.

## 2018-10-21 ENCOUNTER — Telehealth: Payer: Self-pay | Admitting: *Deleted

## 2018-10-21 MED ORDER — COLLAGENASE 250 UNIT/GM EX OINT: 1.0000 "application " | TOPICAL_OINTMENT | Freq: Every day | CUTANEOUS | 5 refills | Status: DC

## 2018-10-21 MED ORDER — COLLAGENASE 250 UNIT/GM EX OINT: 1.0000 "application " | TOPICAL_OINTMENT | Freq: Every day | CUTANEOUS | 0 refills | Status: DC

## 2018-10-21 NOTE — Telephone Encounter (Signed)
-----   Message from Landis Martins, [place] sent at 10/20/2018  5:28 PM EDT ----- Regarding: Santyl Please order santyl from [facility] for patient's bilateral's foot ulcers to be mailed to her home. -Dr. Cannon Kettle

## 2018-10-21 NOTE — Telephone Encounter (Signed)
Ok great. Please let patient's daughter know that [name] will take care of the Rx and not [facility] mail order pharmacy like I had told her in office on yesterday Thanks Dr Chauncey Cruel

## 2018-10-21 NOTE — Telephone Encounter (Signed)
See my note I staged it as stage 2. And no there were no supplies ordered. The daughter said she had dressings at home. Only ordered Santyl. Dr Chauncey Cruel

## 2018-10-21 NOTE — Telephone Encounter (Signed)
I informed [name] of the change of pharmacy from [facility] to [facility]. [name] states understanding and asked the status of the [facility] order and stage of the ulcers.

## 2018-10-21 NOTE — Telephone Encounter (Signed)
Santyl ordered through PA processing with [facility].

## 2018-10-21 NOTE — Telephone Encounter (Signed)
I informed pt's dtr, [name] of Dr. Leeanne Rio staging and dressing orders. [name] states she informed the staff yesterday they didn't have dressings. I told her I would orders dressings similar to what Dr. Cannon Kettle applied yesterday through [facility]. [name] states she used 4 x 4 gauze, kerlix and ace wraps. Faxed orders to [facility].

## 2018-10-21 NOTE — Telephone Encounter (Signed)
No other supplies

## 2018-10-23 ENCOUNTER — Encounter: Payer: Self-pay | Admitting: Sports Medicine

## 2018-10-23 NOTE — Telephone Encounter (Signed)
Left message informing pt's dtr, of Ddr. Stover's statement the wound culture is not available yet and antibiotic should be completed and any change would be discuss once results are in and the Santyl is still in pre-cert and I will call once they contact me.

## 2018-10-24 LAB — WOUND CULTURE
MICRO NUMBER:: 397532
MICRO NUMBER:: 397534
SPECIMEN QUALITY:: ADEQUATE
SPECIMEN QUALITY:: ADEQUATE

## 2018-10-26 ENCOUNTER — Encounter: Payer: Self-pay | Admitting: Sports Medicine

## 2018-10-27 ENCOUNTER — Telehealth: Payer: Self-pay | Admitting: Family Medicine

## 2018-10-27 ENCOUNTER — Encounter: Payer: Self-pay | Admitting: Sports Medicine

## 2018-10-27 NOTE — Telephone Encounter (Signed)
Copied from [place] [id number]. Topic: Quick Communication - See Telephone Encounter >> Oct 27, 2018 11:24 AM Antonieta Iba C wrote: CRM for notification. See Telephone encounter for: 10/27/18.  Terrie - RN w/ [facility] calling in to request vo for PT    [phone]

## 2018-10-27 NOTE — Telephone Encounter (Signed)
Sent message to pt asking if they are wanting this service from home health as last time Dr. Deborra Medina agreed they declined services/thx dmf

## 2018-10-28 NOTE — Telephone Encounter (Signed)
Per TA ok to give verbal orders/pt is in agreement/completed/thx dmf

## 2018-11-02 ENCOUNTER — Encounter: Payer: Self-pay | Admitting: Family Medicine

## 2018-11-02 ENCOUNTER — Ambulatory Visit: Payer: Self-pay | Admitting: Podiatrist

## 2018-11-02 ENCOUNTER — Telehealth: Payer: Self-pay | Admitting: Family Medicine

## 2018-11-02 NOTE — Telephone Encounter (Signed)
Yes okay to give verbal orders as requested. 

## 2018-11-02 NOTE — Telephone Encounter (Signed)
Copied from [place] [phone]. Topic: Quick Communication - Home Health Verbal Orders >> Nov 02, 2018  3:03 PM Luciana Axe wrote: Caller/Agency: Jeff[facility] Number: [phone] ok to leave verbal on voice mailed Requesting OT/PT/Skilled Nursing/Social Work/Speech Therapy: Home Health PT Frequency: twice week for 1 week, 1 week for 4 weeks

## 2018-11-02 NOTE — Telephone Encounter (Signed)
Vinie Sill from [facility] gave verbal orders per Dr. Deborra Medina.

## 2018-11-02 NOTE — Telephone Encounter (Signed)
Dr. Deborra Medina okay to give verbal order?

## 2018-11-03 ENCOUNTER — Ambulatory Visit: Payer: Medicare Other | Admitting: Sports Medicine

## 2018-11-05 ENCOUNTER — Encounter: Payer: Self-pay | Admitting: Sports Medicine

## 2018-11-05 NOTE — Telephone Encounter (Signed)
I called Tiffany - [facility] and she states Santyl was put on hold, but not sure, it is $64.00. I asked Tiffany to go ahead and pull for pt to pick up and I would inform pt.

## 2018-11-06 ENCOUNTER — Encounter: Payer: Medicare Other | Attending: Physical Medicine & Rehabilitation | Admitting: Physical Medicine & Rehabilitation

## 2018-11-06 ENCOUNTER — Encounter: Payer: Self-pay | Admitting: Physical Medicine & Rehabilitation

## 2018-11-06 ENCOUNTER — Other Ambulatory Visit: Payer: Self-pay

## 2018-11-06 VITALS — BP 150/72 | HR 76 | Ht 62.0 in | Wt 150.0 lb

## 2018-11-06 DIAGNOSIS — M25512 Pain in left shoulder: Secondary | ICD-10-CM | POA: Diagnosis not present

## 2018-11-06 DIAGNOSIS — G8194 Hemiplegia, unspecified affecting left nondominant side: Secondary | ICD-10-CM

## 2018-11-06 DIAGNOSIS — G8929 Other chronic pain: Secondary | ICD-10-CM | POA: Diagnosis not present

## 2018-11-06 NOTE — Progress Notes (Signed)
Subjective:  Consent for phone visit  Patient ID: [name], female    DOB: 1945-12-16, 73 y.o.   MRN: [id number] Consent for phone visit.  At patient's request her daughter was in on the call as well. HPI  73 year old female who sustained a right M2 distribution infarct as well as scattered smaller infarcts in the left frontoparietal right insula on 02/24/2018.  Her past medical history is significant for hypertension diabetes chronic kidney disease Further work-up while hospitalized demonstrated severely calcified mitral valve with grade 2 diastolic dysfunction.  Embolic stroke was suspected.  Patient declined TEE and loop recorder.  Neurology recommended dual antiplatelet agents for 3 weeks then aspirin alone. She completed her inpatient rehabilitation program and was discharged home on 02/27/2018.  She was discharged at a contact-guard assist level for her ADLs and mobility.  She used a straight cane The patient had no rehab follow-up on the outpatient side but call for appointment after a fall on 09/21/2018 which resulted in left superior pubic ramus fracture CC Left shoulder pain  The patient had shoulder films performed on 09/21/2018 which demonstrated no evidence of fracture or other abnormalities.  Patient was discharged on 09/27/2018 The patient developed stage I pressure ulcers on both heels and states that these continue to be painful.  Pictures from yesterday demonstrating grade 2 heel ulcers on bilateral plantar surfaces of the heels She has followed up with her primary care physician   Left shoulder range is poor Left hemiparesis UE> LE mainly in WC  Prior to her fall, the patient was using bathroom Mod I with Walker  Pain Inventory Average Pain 7 Pain Right Now 7 My pain is intermittent and constant  In the last 24 hours, has pain interfered with the following? General activity foot pain limiting standing Relation with others 0 Enjoyment of life 10 What TIME of day  is your pain at its worst? daytime Sleep (in general) Poor  Pain is worse with: standing Pain improves with: rest Relief from Meds: 5  Mobility walk with assistance ability to climb steps?  no do you drive?  no use a wheelchair needs help with transfers transfers alone  Function retired  Neuro/Psych weakness trouble walking  Prior Studies Any changes since last visit?  no  Physicians involved in your care Any changes since last visit?  no   Family History  Problem Relation Age of Onset  . Kidney disease Mother   . Congestive Heart Failure Father   . COPD Father   . COPD Brother   . Diabetes Maternal Aunt    Social History   Socioeconomic History  . Marital status: Married    Spouse name: Rennis Harding  . Number of children: 2  . 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 Highest education level: High school graduate  Occupational History  . Not on file  Social Needs  . Financial resource strain: Somewhat hard  . Food insecurity:    Worry: Never true    Inability: Never true  . Transportation needs:    Medical: No    Non-medical: No  Tobacco Use  . Smoking status: Never Smoker  . Smokeless tobacco: Never Used  Substance and Sexual Activity  . Alcohol use: No  . Drug use: No  . Sexual activity: Not Currently  Lifestyle  . Physical activity:    Days per week: 0 days    Minutes per session: 0 min  . Stress:  Not at all  Relationships  . Social connections:    Talks on phone: More than three times a week    Gets together: Twice a week    Attends religious service: Never    Active member of club or organization: No    Attends meetings of clubs or organizations: Never    Relationship status: Married  Other Topics Concern  . Not on file  Social History Narrative  . Not on file   Past Surgical History:  Procedure Laterality Date  . callous removal  Left 2017   located on 1st digit on L foot 2/2 diabetes  . EYE  SURGERY     Past Medical History:  Diagnosis Date  . Allergy   . Arthritis   . Cataract   . Diabetes mellitus without complication ([place])   . Hypertension   . Macular degeneration syndrome    with edema---being treated.    There were no vitals taken for this visit.  Opioid Risk Score:   Fall Risk Score:  `1  Depression screen PHQ 2/9  Depression screen [facility] 2/9 02/18/2018 09/02/2017  Decreased Interest 1 0  Down, Depressed, Hopeless 1 0  PHQ - 2 Score 2 0  Altered sleeping 1 -  Tired, decreased energy 2 -  Change in appetite 0 -  Feeling bad or failure about yourself  1 -  Trouble concentrating 0 -  Moving slowly or fidgety/restless 0 -  Suicidal thoughts 0 -  PHQ-9 Score 6 -     Review of Systems  Constitutional: Negative.   HENT: Negative.   Eyes: Negative.   Respiratory: Negative.   Cardiovascular: Negative.   Gastrointestinal: Negative.   Endocrine: Negative.   Genitourinary: Negative.   Musculoskeletal: Positive for gait problem.  Skin: Negative.   Allergic/Immunologic: Negative.   Neurological: Positive for weakness.  Hematological: Negative.   Psychiatric/Behavioral: Negative.   All other systems reviewed and are negative.      Objective:   Physical Exam Nursing note reviewed.  Neurological:     Mental Status: She is alert and oriented to person, place, and time.  Psychiatric:        Mood and Affect: Mood normal.        Behavior: Behavior normal.    Remainder of examination is deferred secondary to phone visit       Assessment & Plan:  1.  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  We discussed that prognosis for further improvement of her functional status to prior levels is good but may take several months. I agree with home health therapy and will  progress to outpatient once heel wounds have improved. 2.  Left shoulder pain likely CVA related.  Differential includes adhesive capsulitis versus spasticity related versus dysesthetic pain or subacromial impingement syndrome.  She may have a combination of these factors.  We discussed that she will need to be assessed in the clinic for this.  She may require a corticosteroid injection if there is evidence of subacromial impingement syndrome or adhesive capsulitis in the left shoulder.  If tone seems to be a major issue may need to schedule for botulinum toxin injection. Discussed with patient and her daughter at length.  Agree with plan Advised against nonsteroidal anti-inflammatories due to chronic kidney disease as well as history of CVA.  May use Tylenol  Duration of call 8min

## 2018-11-09 ENCOUNTER — Encounter: Payer: Self-pay | Admitting: Family Medicine

## 2018-11-09 ENCOUNTER — Telehealth: Payer: Self-pay | Admitting: Family Medicine

## 2018-11-09 ENCOUNTER — Encounter: Payer: Self-pay | Admitting: Sports Medicine

## 2018-11-09 NOTE — Telephone Encounter (Signed)
Copied from [place] [id number]. Topic: Quick Communication - Home Health Verbal Orders >> Nov 09, 2018  9:58 AM Margot Ables wrote: Caller: Ronalee Belts OT with [facility] Phone # [phone], secure VM Pt is making slow steady progress Requesting VO to continue OT for 2 week 2, 1 week 2

## 2018-11-09 NOTE — Telephone Encounter (Signed)
Called and gave verbal for OT per TA/thx dmf

## 2018-11-10 ENCOUNTER — Telehealth: Payer: Self-pay | Admitting: Physical Medicine & Rehabilitation

## 2018-11-10 ENCOUNTER — Ambulatory Visit: Payer: Medicare Other | Admitting: Family Medicine

## 2018-11-10 NOTE — Telephone Encounter (Signed)
error 

## 2018-11-11 ENCOUNTER — Encounter: Payer: Self-pay | Admitting: Sports Medicine

## 2018-11-12 NOTE — Telephone Encounter (Signed)
Pt requested 4 x coban from [facility]. Orders Faxed to [facility].

## 2018-11-13 ENCOUNTER — Telehealth: Payer: Self-pay

## 2018-11-13 NOTE — Telephone Encounter (Signed)
Copied from [place] [id number]. Topic: Quick Communication - See Telephone Encounter >> Nov 12, 2018  5:31 PM Loma [place] wrote: CRM for notification. See Telephone encounter for: 11/12/18. After Hours/ Marlowe Kays from [facility] called and wanted to leave an urgent message for Dr Deborra Medina. She is with pt now and has taken her BP reading and it was 192/80 and then she followed up again few min later and is 186/60. She has wheezing in both of her lungs. Please call her back if possible at [phone]

## 2018-11-13 NOTE — Telephone Encounter (Signed)
TA-I was able to speak to daughter who explained everything/OT came when he was scheduled which was 1 hour prior to the PT showing up/PT was scheduled for 1pm but showed up at 5pm/pt was frazzled by PT/BP was fine when OT took it during their session and there was no wheezing/20 minutes after the PT left her BP came down to 160/80/the PT took her BP 6 times in a row without any rest in between/she only recorded the highest numbers she got but never reported the lower BP numbers/they are requesting an alternate PT instead as this one is not a good fit  Daughter denies any wheezing, shallow or labored breathing/has been using incentive spirometry/is moving around as well as able and doing PT on their own as well/she says that OT comes back on Tuesday and if there are any issues she will call here to report what OT observes/BP today at 9am was 150/80  Daughter does ask if you know of an out-patient rehab facility that she can start taking patient to in the next 3 weeks?Plz advise/thx dmf

## 2018-11-13 NOTE — Telephone Encounter (Signed)
LMOVM for [facility] Nurse to RTC as I need more information/If she has been completely immobile/what is her O2/is breathing labored and shallow?thx dmf

## 2018-11-13 NOTE — Telephone Encounter (Signed)
I don't know the answer to that- do you think Dr. Raeford Razor might know?

## 2018-11-17 ENCOUNTER — Ambulatory Visit: Payer: Medicare Other | Admitting: Sports Medicine

## 2018-11-17 ENCOUNTER — Other Ambulatory Visit: Payer: Self-pay

## 2018-11-17 ENCOUNTER — Ambulatory Visit: Payer: Medicare Other | Admitting: Podiatry

## 2018-11-17 ENCOUNTER — Encounter: Payer: Self-pay | Admitting: Podiatry

## 2018-11-17 VITALS — Temp 97.4°F | Resp 16

## 2018-11-17 DIAGNOSIS — B351 Tinea unguium: Secondary | ICD-10-CM

## 2018-11-17 DIAGNOSIS — L89622 Pressure ulcer of left heel, stage 2: Secondary | ICD-10-CM | POA: Diagnosis not present

## 2018-11-17 DIAGNOSIS — L89612 Pressure ulcer of right heel, stage 2: Secondary | ICD-10-CM

## 2018-11-17 MED ORDER — CICLOPIROX 8 % EX SOLN: Freq: Every day | CUTANEOUS | 0 refills | Status: DC

## 2018-11-17 NOTE — Telephone Encounter (Signed)
JS-Do you know of an Out-Patient PT facility that is open to patients right now? Plz advise/thx dmf

## 2018-11-17 NOTE — Progress Notes (Signed)
Subjective:  Patient ID: [name], female    DOB: Apr 02, 1946,  MRN: [id number]  Chief Complaint  Patient presents with  . Foot Ulcer    F/U BL bottom heel ulcers Pt. sates," we think it's healing but we want a 2nd opinion." Tx: santyl and medihoney -w/ lgiht yellow drainage from Lt heel and redness -pt denies N/V/F/Ch   . Diabetes    FBS: 104 A1C: 6.1    73 y.o. female presents for wound care.  Thinks the wound are doing well.  History as above.  States that the wound started from pressure ulcerations on hospitalization  Review of Systems: Negative except as noted in the HPI. Denies N/V/F/Ch.  Past Medical History:  Diagnosis Date  . Allergy   . Arthritis   . Cataract   . Diabetes mellitus without complication ([place])   . Hypertension   . Macular degeneration syndrome    with edema---being treated.     Current Outpatient Medications:  .  acetaminophen (TYLENOL) 650 MG CR tablet, Take 1 tablet (650 mg total) by mouth every 6 (six) hours as needed for pain., Disp: 120 tablet, Rfl: 3 .  aspirin EC 81 MG tablet, Take 81 mg by mouth daily., Disp: , Rfl:  .  BLACK CURRANT SEED OIL PO, Take 1 capsule by mouth daily., Disp: , Rfl:  .  blood glucose meter kit and supplies KIT, Dispense based on patient and insurance preference. UAD bid for glucose monitoring Dx: E11.9, Disp: 1 each, Rfl: 0 .  collagenase (SANTYL) ointment, Apply 1 application topically daily. Wound Measurements - Left heel 2.0 x 1.0 x 0.1cm                                         Right heel 3.0 x 2.0 x 0.1cm, Disp: 30 g, Rfl: 5 .  collagenase (SANTYL) ointment, Apply 1 application topically daily., Disp: 15 g, Rfl: 0 .  fexofenadine (ALLEGRA) 180 MG tablet, Take 180 mg by mouth daily., Disp: , Rfl:  .  glucose blood test strip, UAD bid for glucose monitoring Dx: E11.9, Disp: 100 each, Rfl: 12 .  Lancet Device MISC, UAD bid for glucose monitoring Dx: E11.9, Disp: 1 each, Rfl: prn .  losartan (COZAAR) 25 MG tablet,  Take 1 tablet (25 mg total) by mouth daily., Disp: 30 tablet, Rfl: 1 .  methocarbamol (ROBAXIN) 500 MG tablet, Take 1 tablet (500 mg total) by mouth every 8 (eight) hours as needed for muscle spasms., Disp: 20 tablet, Rfl: 0 .  oxycodone (OXY-IR) 5 MG capsule, Take 1 capsule (5 mg total) by mouth every 4 (four) hours as needed., Disp: 30 capsule, Rfl: 0 .  ciclopirox (PENLAC) 8 % solution, Apply topically at bedtime. Apply over nail and surrounding skin. Apply daily over previous coat. Remove weekly with file or polish remover., Disp: 6.6 mL, Rfl: 0  Social History   Tobacco Use  Smoking Status Never Smoker  Smokeless Tobacco Never Used    Allergies  Allergen Reactions  . Clonidine Anaphylaxis, Swelling and Other (See Comments)    Tongue swelling  . Norvasc [Amlodipine Besylate] Swelling    Edema and TONGUE swelling   . Lisinopril Cough  . Amlodipine Anxiety, Nausea Only and Swelling  . Codeine Itching and Rash   Objective:   Vitals:   11/17/18 1053  Resp: 16  Temp: (!) 97.4 F (36.3 C)  There is no height or weight on file to calculate BMI. Constitutional Well developed. Well nourished.  Vascular Dorsalis pedis pulses palpable bilaterally. Posterior tibial pulses palpable bilaterally. Capillary refill normal to all digits.  No cyanosis or clubbing noted. Pedal hair growth normal.  Neurologic Normal speech. Oriented to person, place, and time. Protective sensation absent  Dermatologic  bilateral heel ulcerations left approximately 2 x 1 right approximately 5 x 3 with fibro-granular base.  No significant hyperkeratosis no warmth no erythema no signs of acute infection no apparent probe to bone.  Orthopedic: No pain to palpation either foot.   Radiographs: None today Assessment:   1. Decubitus ulcer of heel, bilateral, stage 2 ([place])   2. Onychomycosis    Plan:  Patient was evaluated and treated and all questions answered.  Ulcer bilateral heel, Wagner Grade 1 -No  debridement  -Dressed with medihoney, DSD. -Continue off-loading with Multi-Podus boots -Continue Santyl wet-to-dry dressing daily  Return in about 2 weeks (around 12/01/2018).

## 2018-11-18 ENCOUNTER — Encounter: Payer: Self-pay | Admitting: Sports Medicine

## 2018-11-18 ENCOUNTER — Ambulatory Visit (INDEPENDENT_AMBULATORY_CARE_PROVIDER_SITE_OTHER): Payer: Medicare Other | Admitting: Sports Medicine

## 2018-11-18 ENCOUNTER — Other Ambulatory Visit: Payer: Self-pay

## 2018-11-18 VITALS — BP 144/70 | Ht 62.0 in | Wt 152.8 lb

## 2018-11-18 DIAGNOSIS — M7502 Adhesive capsulitis of left shoulder: Secondary | ICD-10-CM | POA: Diagnosis not present

## 2018-11-18 DIAGNOSIS — M25512 Pain in left shoulder: Secondary | ICD-10-CM

## 2018-11-18 MED ORDER — METHYLPREDNISOLONE ACETATE 40 MG/ML IJ SUSP
40.0000 mg | Freq: Once | INTRAMUSCULAR | Status: AC
  Administered 2018-11-18: 40 mg via INTRA_ARTICULAR

## 2018-11-18 NOTE — Progress Notes (Signed)
   Subjective:    Patient ID: [name], female    DOB: March 01, 1946, 73 y.o.   MRN: [id number]  HPI chief complaint: Left shoulder pain  Very pleasant right-hand-dominant 73 year old female comes in today complaining of left shoulder pain.  She has a history of a CVA which affected her left side in August 2019.  Since that time she has had limited mobility and pain.  Her pain was aggravated by fall in March which resulted in a pelvic fracture which was treated conservatively by Dr. Mardelle Matte.  X-rays of the left shoulder at the time of her fall showed no fracture.  No evidence of arthritis.  Physical therapy has been coming to her home for the past 4 to 5 weeks working on mobility, including her left shoulder.  Her daughter accompanies her today and tells me that her mom's range of motion is better after her PT visit but that it stiffens up again shortly afterwards.  Patient localizes most of her pain to the posterior shoulder.  Pain will radiate down the left arm at times.  She is under the excellent care of Dr. Letta Pate and had a telemedicine visit with him on May 1.  She has a follow-up appointment with him next week to discuss work-up and treatment for the left shoulder.  Past medical history is reviewed Medications are reviewed Allergies are reviewed    Review of Systems As above    Objective:   Physical Exam  Well-developed, well-nourished.  No acute distress.  Sitting comfortably in a wheelchair.  Vital signs reviewed  Left shoulder: Patient has severely limited active and passive range of motion in all planes.  Passive external rotation is to only 20 degrees.  This is compared to full passive external rotation of the right shoulder.  There is diffuse atrophy around the left shoulder as well as global weakness secondary to her CVA.  Good pulses distally  X-rays of the left shoulder as above      Assessment & Plan:   Acute on chronic left shoulder pain Adhesive capsulitis  Status post CVA Status post nondisplaced pelvic fracture  Patient has severely limited passive range of motion of the left shoulder.  This suggests adhesive capsulitis but I am not sure how much of this is chronic secondary to her CVA.  I performed an intra-articular cortisone injection today for pain control and I would like for her to continue working with home PT on range of motion exercises.  If symptoms persist, I would recommend following up with Dr.Kirsteins.  Review of his office notes shows that she may be a candidate for botulinum toxin injection.  Follow-up with me PRN.  Consent obtained and verified. Time-out conducted. Noted no overlying erythema, induration, or other signs of local infection. Skin prepped in a sterile fashion. Topical analgesic spray: Ethyl chloride. Joint: left shoulder (intra-articular) Needle: 25g 1.5 inch Completed without difficulty. Meds: 3cc 1% xylocaine, 1cc (40mg ) depomedrol  Advised to call if fevers/chills, erythema, induration, drainage, or persistent bleeding.

## 2018-11-18 NOTE — Telephone Encounter (Signed)
Spoke with patient's daughter. She will call back when current in home therapy has finished.   Rosemarie Ax, MD [facility] 11/18/2018, 2:59 PM

## 2018-11-19 ENCOUNTER — Encounter: Payer: Self-pay | Admitting: Family Medicine

## 2018-11-20 ENCOUNTER — Telehealth: Payer: Self-pay

## 2018-11-20 NOTE — Telephone Encounter (Signed)
Noted/discussed with pt daughter and they are continuing PT as taught/thx dmf

## 2018-11-20 NOTE — Telephone Encounter (Signed)
Copied from [place] [id number]. Topic: General - Inquiry >> Nov 20, 2018  1:00 PM Richardo Priest, [place] wrote: Reason for CRM: Marlowe Kays with [facility] called in to inform Dr.Aron that physical therapy was not done this week due to patient having appointments.

## 2018-11-23 ENCOUNTER — Ambulatory Visit: Payer: Medicare Other | Admitting: Physical Medicine & Rehabilitation

## 2018-11-25 ENCOUNTER — Encounter: Payer: Self-pay | Admitting: Podiatry

## 2018-11-25 ENCOUNTER — Telehealth: Payer: Self-pay | Admitting: Family Medicine

## 2018-11-25 DIAGNOSIS — G8194 Hemiplegia, unspecified affecting left nondominant side: Secondary | ICD-10-CM

## 2018-11-25 NOTE — Telephone Encounter (Signed)
Dr. Armanda Magic and okay to give verbal?

## 2018-11-25 NOTE — Telephone Encounter (Signed)
Copied from [place] [id number]. Topic: Quick Communication - Home Health Verbal Orders >> Nov 25, 2018  2:11 PM Erick Blinks wrote: Caller/Agency: Marlowe Kays, [place] Number: [phone] Requesting OT/PT/Skilled Nursing/Social Work/Speech Therapy: Pt cancelled PT this week, pt received shots this week and requested to continue schedule next week

## 2018-11-25 NOTE — Telephone Encounter (Signed)
Yes okay to give verbal order as requested.  Thank you.

## 2018-11-25 NOTE — Telephone Encounter (Signed)
Left connie VM to call back to give verbal ok

## 2018-11-26 ENCOUNTER — Encounter: Payer: Self-pay | Admitting: Podiatry

## 2018-11-27 MED ORDER — COLLAGENASE 250 UNIT/GM EX OINT: 1.0000 "application " | TOPICAL_OINTMENT | Freq: Every day | CUTANEOUS | 5 refills | Status: DC

## 2018-11-30 ENCOUNTER — Encounter: Payer: Self-pay | Admitting: Family Medicine

## 2018-12-01 ENCOUNTER — Encounter: Payer: Self-pay | Admitting: Family Medicine

## 2018-12-01 ENCOUNTER — Other Ambulatory Visit: Payer: Self-pay

## 2018-12-01 MED ORDER — LOSARTAN POTASSIUM 25 MG PO TABS: 25.0000 mg | ORAL_TABLET | Freq: Every day | ORAL | 3 refills | Status: DC

## 2018-12-03 ENCOUNTER — Encounter: Payer: Self-pay | Admitting: Podiatry

## 2018-12-08 ENCOUNTER — Ambulatory Visit: Payer: Medicare Other | Admitting: Podiatry

## 2018-12-10 ENCOUNTER — Ambulatory Visit: Payer: Medicare Other | Admitting: Family Medicine

## 2018-12-10 ENCOUNTER — Encounter: Payer: Self-pay | Admitting: Podiatry

## 2018-12-15 ENCOUNTER — Encounter: Payer: Self-pay | Admitting: Physical Medicine & Rehabilitation

## 2018-12-15 ENCOUNTER — Inpatient Hospital Stay: Payer: Medicare Other | Admitting: Physical Medicine & Rehabilitation

## 2018-12-15 ENCOUNTER — Other Ambulatory Visit: Payer: Self-pay

## 2018-12-15 ENCOUNTER — Encounter: Payer: Medicare Other | Attending: Physical Medicine & Rehabilitation | Admitting: Physical Medicine & Rehabilitation

## 2018-12-15 VITALS — BP 188/72 | HR 73 | Temp 98.0°F | Ht 62.0 in | Wt 161.0 lb

## 2018-12-15 DIAGNOSIS — G8114 Spastic hemiplegia affecting left nondominant side: Secondary | ICD-10-CM | POA: Diagnosis present

## 2018-12-15 DIAGNOSIS — R262 Difficulty in walking, not elsewhere classified: Secondary | ICD-10-CM | POA: Insufficient documentation

## 2018-12-15 DIAGNOSIS — E1122 Type 2 diabetes mellitus with diabetic chronic kidney disease: Secondary | ICD-10-CM | POA: Insufficient documentation

## 2018-12-15 DIAGNOSIS — R450 Nervousness: Secondary | ICD-10-CM | POA: Diagnosis not present

## 2018-12-15 DIAGNOSIS — M25512 Pain in left shoulder: Secondary | ICD-10-CM | POA: Diagnosis not present

## 2018-12-15 DIAGNOSIS — M1990 Unspecified osteoarthritis, unspecified site: Secondary | ICD-10-CM | POA: Insufficient documentation

## 2018-12-15 DIAGNOSIS — I129 Hypertensive chronic kidney disease with stage 1 through stage 4 chronic kidney disease, or unspecified chronic kidney disease: Secondary | ICD-10-CM | POA: Diagnosis not present

## 2018-12-15 DIAGNOSIS — G8929 Other chronic pain: Secondary | ICD-10-CM | POA: Diagnosis not present

## 2018-12-15 DIAGNOSIS — R531 Weakness: Secondary | ICD-10-CM | POA: Diagnosis not present

## 2018-12-15 DIAGNOSIS — F419 Anxiety disorder, unspecified: Secondary | ICD-10-CM | POA: Diagnosis not present

## 2018-12-15 DIAGNOSIS — G8194 Hemiplegia, unspecified affecting left nondominant side: Secondary | ICD-10-CM | POA: Diagnosis not present

## 2018-12-15 NOTE — Patient Instructions (Signed)
OnabotulinumtoxinA injection (Medical Use) What is this medicine? ONABOTULINUMTOXINA (o na BOTT you lye num tox in eh) is a neuro-muscular blocker. This medicine is used to treat crossed eyes, eyelid spasms, severe neck muscle spasms, ankle and toe muscle spasms, and elbow, wrist, and finger muscle spasms. It is also used to treat excessive underarm sweating, to prevent chronic migraine headaches, and to treat loss of bladder control due to neurologic conditions such as multiple sclerosis or spinal cord injury. This medicine may be used for other purposes; ask your health care provider or pharmacist if you have questions. COMMON BRAND NAME(S): Botox What should I tell my health care provider before I take this medicine? They need to know if you have any of these conditions: -breathing problems -cerebral palsy spasms -difficulty urinating -heart problems -history of surgery where this medicine is going to be used -infection at the site where this medicine is going to be used -myasthenia gravis or other neurologic disease -nerve or muscle disease -surgery plans -take medicines that treat or prevent blood clots -thyroid problems -an unusual or allergic reaction to botulinum toxin, albumin, other medicines, foods, dyes, or preservatives -pregnant or trying to get pregnant -breast-feeding How should I use this medicine? This medicine is for injection into a muscle. It is given by a health care professional in a hospital or clinic setting. Talk to your pediatrician regarding the use of this medicine in children. While this drug may be prescribed for children as young as 74 years old for selected conditions, precautions do apply. Overdosage: If you think you have taken too much of this medicine contact a poison control center or emergency room at once. NOTE: This medicine is only for you. Do not share this medicine with others. What if I miss a dose? This does not apply. What may interact with  this medicine? -aminoglycoside antibiotics like gentamicin, neomycin, tobramycin -muscle relaxants -other botulinum toxin injections This list may not describe all possible interactions. Give your health care provider a list of all the medicines, herbs, non-prescription drugs, or dietary supplements you use. Also tell them if you smoke, drink alcohol, or use illegal drugs. Some items may interact with your medicine. What should I watch for while using this medicine? Visit your doctor for regular check ups. This medicine will cause weakness in the muscle where it is injected. Tell your doctor if you feel unusually weak in other muscles. Get medical help right away if you have problems with breathing, swallowing, or talking. This medicine might make your eyelids droop or make you see blurry or double. If you have weak muscles or trouble seeing do not drive a car, use machinery, or do other dangerous activities. This medicine contains albumin from human blood. It may be possible to pass an infection in this medicine, but no cases have been reported. Talk to your doctor about the risks and benefits of this medicine. If your activities have been limited by your condition, go back to your regular routine slowly after treatment with this medicine. What side effects may I notice from receiving this medicine? Side effects that you should report to your doctor or health care professional as soon as possible: -allergic reactions like skin rash, itching or hives, swelling of the face, lips, or tongue -breathing problems -changes in vision -chest pain or tightness -eye irritation, pain -fast, irregular heartbeat -infection -numbness -speech problems -swallowing problems -unusual weakness Side effects that usually do not require medical attention (report to your doctor or health care  professional if they continue or are bothersome): -bruising or pain at site where injected -drooping eyelid -dry eyes or  mouth -headache -muscles aches, pains -sensitivity to light -tearing This list may not describe all possible side effects. Call your doctor for medical advice about side effects. You may report side effects to FDA at 1-800-FDA-1088. Where should I keep my medicine? This drug is given in a hospital or clinic and will not be stored at home. NOTE: This sheet is a summary. It may not cover all possible information. If you have questions about this medicine, talk to your doctor, pharmacist, or health care provider.  2019 Elsevier/Gold Standard (2017-12-29 14:21:42)

## 2018-12-15 NOTE — Progress Notes (Signed)
Subjective:    Patient ID: [name], female    DOB: December 17, 1945, 73 y.o.   MRN: [id number] 73 year old female who sustained a right M2 distribution infarct as well as scattered smaller infarcts in the left frontoparietal right insula on 02/24/2018.  Her past medical history is significant for hypertension diabetes chronic kidney disease Further work-up while hospitalized demonstrated severely calcified mitral valve with grade 2 diastolic dysfunction.  Embolic stroke was suspected.  Patient declined TEE and loop recorder.  Neurology recommended dual antiplatelet agents for 3 weeks then aspirin alone. She completed her inpatient rehabilitation program and was discharged home on 02/27/2018.  She was discharged at a contact-guard assist level for her ADLs and mobility.  She used a straight cane The patient had no rehab follow-up on the outpatient side but call for appointment after a fall on 09/21/2018 which resulted in left superior pubic ramus fracture  HPI  Left shoulder constant, day and night  Pain and weakness limit movement of Left shoulder and hand  Hand is manly weak  Hand balls up at times  Needs help with dressing and bathing Pain Inventory Average Pain 6 Pain Right Now 6 My pain is aching  In the last 24 hours, has pain interfered with the following? General activity 7 Relation with others 6 Enjoyment of life 7 What TIME of day is your pain at its worst? night Sleep (in general) Fair  Pain is worse with: walking, bending, inactivity and standing Pain improves with: medication Relief from Meds: 4  Mobility walk with assistance use a walker how many minutes can you walk? 5 ability to climb steps?  no do you drive?  no use a wheelchair needs help with transfers  Function retired I need assistance with the following:  bathing, meal prep, household duties and shopping  Neuro/Psych weakness trouble walking spasms anxiety  Prior Studies Any changes since  last visit?  no x-rays CT/MRI  Physicians involved in your care Any changes since last visit?  no Primary care Dr. Arnette Norris   Family History  Problem Relation Age of Onset  . Kidney disease Mother   . Congestive Heart Failure Father   . COPD Father   . COPD Brother   . Diabetes Maternal Aunt    Social History   Socioeconomic History  . Marital status: Married    Spouse name: Rennis Harding  . Number of children: 2  . 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 Highest education level: High school graduate  Occupational History  . Not on file  Social Needs  . Financial resource strain: Somewhat hard  . Food insecurity:    Worry: Never true    Inability: Never true  . Transportation needs:    Medical: No    Non-medical: No  Tobacco Use  . Smoking status: Never Smoker  . Smokeless tobacco: Never Used  Substance and Sexual Activity  . Alcohol use: No  . Drug use: No  . Sexual activity: Not Currently  Lifestyle  . Physical activity:    Days per week: 0 days    Minutes per session: 0 min  . Stress: Not at all  Relationships  . Social connections:    Talks on phone: More than three times a week    Gets together: Twice a week    Attends religious service: Never    Active member of club or organization: No    Attends meetings of clubs or  organizations: Never    Relationship status: Married  Other Topics Concern  . Not on file  Social History Narrative  . Not on file   Past Surgical History:  Procedure Laterality Date  . callous removal  Left 2017   located on 1st digit on L foot 2/2 diabetes  . EYE SURGERY     Past Medical History:  Diagnosis Date  . Allergy   . Arthritis   . Cataract   . Diabetes mellitus without complication ([place])   . Hypertension   . Macular degeneration syndrome    with edema---being treated.    BP (!) 188/72   Pulse 73   Temp 98 F (36.7 C)   Ht 5\' 2"  (1.575 m)   Wt 161 lb (73 kg) Comment: ortho  shoes on  SpO2 97%   BMI 29.45 kg/m   Opioid Risk Score:   Fall Risk Score:  `1  Depression screen PHQ 2/9  Depression screen [facility] 2/9 12/15/2018 02/18/2018 09/02/2017  Decreased Interest 0 1 0  Down, Depressed, Hopeless 0 1 0  PHQ - 2 Score 0 2 0  Altered sleeping - 1 -  Tired, decreased energy - 2 -  Change in appetite - 0 -  Feeling bad or failure about yourself  - 1 -  Trouble concentrating - 0 -  Moving slowly or fidgety/restless - 0 -  Suicidal thoughts - 0 -  PHQ-9 Score - 6 -    Review of Systems  Constitutional: Positive for diaphoresis.  HENT: Negative.   Eyes: Negative.   Respiratory: Negative.   Cardiovascular: Negative.   Gastrointestinal: Negative.   Endocrine: Negative.   Genitourinary: Negative.   Musculoskeletal: Positive for gait problem.       Spasms  Skin: Negative.   Allergic/Immunologic: Negative.   Neurological: Positive for weakness.  Hematological: Negative.   Psychiatric/Behavioral: The patient is nervous/anxious.   All other systems reviewed and are negative.      Objective:   Physical Exam Vitals signs and nursing note reviewed.  Constitutional:      Appearance: Normal appearance.  HENT:     Mouth/Throat:     Mouth: Mucous membranes are moist.  Neurological:     Mental Status: She is alert.    Left shoulder has no evidence of subluxation Shoulder girdle has no evidence of periscapular atrophy Patient has pain with external rotation as well as abduction as well as forward flexion but not with adduction. There is no hypersensitivity to touch in the left upper extremity.  She has no swelling of the dorsum of the hand no erythema. Tone is MAS 3 at the pectoralis MAS 3 at the bicep MAS 3 at the finger and wrist flexors on the left side  Strength is to minus at the deltoid bicep finger flexors trace at the finger extensors     Assessment & Plan:  1.  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  She does have elevated tone which is not  responsive to physical therapy and other conservative care Botox recommended dosage and muscle group selection Pec 100 Bic 75 BR 25 FDS 25 FDP25 FCR 25  2.  Left shoulder pain with adhesive capsulitis.  We discussed that a shoulder injection would help with the pain but not increase the range of motion.  Given that she has constant pain she would like to repeat this.  Shoulder injection left glenohumeral   Indication: Left shoulder pain not relieved by medication management and other conservative care.  Informed consent was obtained after describing risks and benefits of the procedure with the patient, this includes bleeding, bruising, infection and medication side effects. The patient wishes to proceed and has given written consent. Patient was placed in a seated position. The left shoulder was marked and prepped with betadine in the subacromial area. A 25-gauge 1-1/2 inch needle was inserted into the subacromial area. After negative draw back for blood, a solution containing 1 mL of 6 mg per ML betamethasone and 4 mL of 1% lidocaine was injected. A band aid was applied. The patient tolerated the procedure well. Post procedure instructions were given.

## 2018-12-17 ENCOUNTER — Ambulatory Visit: Payer: Medicare Other | Attending: Physical Medicine & Rehabilitation | Admitting: Occupational Therapy

## 2018-12-17 ENCOUNTER — Other Ambulatory Visit: Payer: Self-pay

## 2018-12-17 DIAGNOSIS — M25512 Pain in left shoulder: Secondary | ICD-10-CM | POA: Diagnosis present

## 2018-12-17 DIAGNOSIS — I69354 Hemiplegia and hemiparesis following cerebral infarction affecting left non-dominant side: Secondary | ICD-10-CM | POA: Diagnosis present

## 2018-12-17 DIAGNOSIS — R2689 Other abnormalities of gait and mobility: Secondary | ICD-10-CM | POA: Insufficient documentation

## 2018-12-17 DIAGNOSIS — M6281 Muscle weakness (generalized): Secondary | ICD-10-CM | POA: Diagnosis present

## 2018-12-17 DIAGNOSIS — R278 Other lack of coordination: Secondary | ICD-10-CM | POA: Diagnosis present

## 2018-12-17 DIAGNOSIS — G8929 Other chronic pain: Secondary | ICD-10-CM | POA: Diagnosis present

## 2018-12-17 DIAGNOSIS — R2681 Unsteadiness on feet: Secondary | ICD-10-CM | POA: Diagnosis present

## 2018-12-17 DIAGNOSIS — M25612 Stiffness of left shoulder, not elsewhere classified: Secondary | ICD-10-CM | POA: Insufficient documentation

## 2018-12-17 NOTE — Therapy (Signed)
[facility] [address] [place], [place], [address] Phone: [phone]   Fax:  [phone]  Occupational Therapy Evaluation  Patient Details  Name: [name] MRN: [id number] Date of Birth: August 29, 1945 Referring Provider (OT): Dr. Letta Pate   Encounter Date: 12/17/2018  OT End of Session - 12/17/18 0910    Visit Number  1    Number of Visits  17    Date for OT Re-Evaluation  02/16/19    Authorization Type  [employer] MCR    OT Start Time  0800    OT Stop Time  0855    OT Time Calculation (min)  55 min    Activity Tolerance  Patient tolerated treatment well    Behavior During Therapy  [facility] for tasks assessed/performed       Past Medical History:  Diagnosis Date  . Allergy   . Arthritis   . Cataract   . Diabetes mellitus without complication ([place])   . Hypertension   . Macular degeneration syndrome    with edema---being treated.     Past Surgical History:  Procedure Laterality Date  . callous removal  Left 2017   located on 1st digit on L foot 2/2 diabetes  . EYE SURGERY      There were no vitals filed for this visit.  Subjective Assessment - 12/17/18 0809    Patient is accompanied by:  Family member   Daughter Unk Lightning)   Pertinent History  CVA 02/27/18, pelvic fx and Lt hip  fx 09/2018. PMH: HTN, OA    Limitations  Fall risk, wounds on feet    Currently in Pain?  Yes    Pain Score  5     Pain Location  Shoulder    Pain Orientation  Left    Pain Type  Chronic pain    Pain Onset  More than a month ago    Pain Frequency  Intermittent    Aggravating Factors   P/ROM    Pain Relieving Factors  Tylenol        [facility] OT Assessment - 12/17/18 0001      Assessment   Medical Diagnosis  Lt hemiparesis from Rt CVA 02/27/18, Lt shoulder adhesive capsulitis    Referring Provider (OT)  Dr. Letta Pate    Onset Date/Surgical Date  02/27/18   Pt has had pelvic fx/Lt hip fx in March 2020   IXL  Right     Prior Therapy  Inpatient rehab, home health OT      Precautions   Precautions  Fall    Precaution Comments  wounds healing on both feet      Balance Screen   Has the patient fallen in the past 6 months  Yes    How many times?  [place] Shower/Tub  Walk-in Shower;Curtain   grab bar installed, shower seat   Additional Comments  Pt's daughter is staying with patient since pelvic fx. Pt lives in 1 story home w/ ramp to enter since pelvic fx. DME: bed rail, w/c, shower seat, BSC    Lives With  Spouse      Prior Function   Level of Independence  Independent   prior to 02/27/18, still walking until pelvic fx   Vocation  Retired      ADL   Eating/Feeding  Modified independent   occasion assist cutting food   Grooming  Set up   however dependent  for styling/putting up hair   Upper Body Bathing  Supervision/safety    Lower Body Bathing  Supervision/safety    Upper Body Dressing  Minimal assistance    Lower Body Dressing  Maximal assistance    Toilet Transfer  Minimal assistance   using BSC since pelvic fx   Toileting - Clothing Manipulation  + 1 Total assistance    Toileting -  Hygiene  Modified Independent   assist for thoroughness   Tub/Shower Transfer  Minimal assistance    ADL comments  Pt was walking to bathroom and toileting I'ly prior to pelvic fx      IADL   Shopping  Completely unable to shop    Meal Prep  Needs to have meals prepared and served   Frozen meals delivered, pt did microwave stuff prior to [employer] on family or friends for transportation   Hasn't driven since stroke     Mobility   Mobility Status  Needs assist    Mobility Status Comments  Using w/c predominantly since pelvic and hip fx in March, but was walking I'ly after stroke. 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 Pt also with recent Lt shoulder injection, will go back in 2 weeks for botox to LUE      Posture/Postural Control   Posture/Postural Control  Postural limitations    Postural Limitations  Rounded Shoulders;Flexed trunk      Sensation   Light Touch  Appears Intact      Coordination   9 Hole Peg Test  Left    Left 9 Hole Peg Test  placed 5 pegs in pegboard    Box and Blocks  Lt = 11      Edema   Edema  none      ROM / Strength   AROM / PROM / Strength  AROM      AROM   Overall AROM Comments  LUE: Shoulder flex 80-85* degrees, ER/IR approx 50%. Elbow distally WFL's with exception of thumb opposition to 4th digit, full composite flexion approx 90%, supination 90%      Hand Function   Right Hand Grip (lbs)  40 lbs    Left Hand Grip (lbs)  8 lbs                        OT Short Term Goals - 12/17/18 0917      OT SHORT TERM GOAL #1   Title  Independent with initial HEP for shoulder ROM, and functional reaching LUE - due 01/16/19    Time  4    Period  Weeks    Status  New      OT SHORT TERM GOAL #2   Title  Pt to perform UB dressing with set up only and LE dressing with mod assist or less using  A/E prn    Time  4    Period  Weeks    Status  New      OT SHORT TERM GOAL #3   Title  Pt to increase LUE functional use as evidenced by performing 20 blocks on Box & Blocks test    Baseline  11 blocks    Time  4    Period  Weeks    Status  New      OT SHORT TERM GOAL #4   Title  Pt to perform toileting/clothes management with only min assist    Time  4    Period  Weeks    Status  New      OT SHORT TERM  GOAL #5   Title  Pt to achieve 90* shoulder flexion LUE for mid level reaching    Time  4    Period  Weeks    Status  New        OT Long Term Goals - 12/17/18 5885      OT LONG TERM GOAL #1   Title  Independent with updated HEP - due 02/16/19    Time  8    Period  Weeks    Status  New      OT LONG TERM GOAL #2   Title  Pt to be mod I with all BADLS using A/E as needed (except shower transfers to still be supervision)    Time  8    Period  Weeks    Status  New      OT LONG TERM GOAL #3   Title  Pt to improve LUE function as evidenced by performing 25 blocks or greater on Box & Blocks test    Time  8    Period  Weeks    Status  New      OT LONG TERM GOAL #4   Title  Pt to improve coordination as evidenced by performing 9 hole peg test completely in 90 sec or less    Baseline  placed 5 in 2 min.    Time  8    Period  Weeks    Status  New      OT LONG TERM GOAL #5   Title  Grip strength Lt hand to be 15 lbs or greater    Baseline  8 lbs    Time  8    Period  Weeks    Status  New      Long Term Additional Goals   Additional Long Term Goals  Yes      OT LONG TERM GOAL #6   Title  Pt to perform simple snack, cold food, and microwaveable food mod I level with DME prn without LOB    Time  8    Period  Weeks    Status  New      OT LONG TERM GOAL #7   Title  Pt to achieve 110* or greater P/ROM Lt shoulder with pain less than or equal to 5/10    Time  8    Period  Weeks    Status  New            Plan - 12/17/18 0911    Clinical Impression Statement  Pt is a 73 y.o. female who presents to outpatient rehab with Lt hemiparesis and Lt adhesive capsultiis from CVA on 02/27/18. Pt also had pelvic and Lt hip fx in March 2020 which caused a significant degression  in functional ambulation, as pt was walking I'ly after stroke. Pt presents with stiffness and pain in Lt shoulder, decreased mobility, decreased coordination and grip strength Lt hand, and would benefit from O.T.  to address Lt shoulder ROM, functional use of LUE, and safety/increased independence with ADLS and transfers    OT Occupational Profile and History  Detailed Assessment- Review of Records and additional review of physical, cognitive, psychosocial history related to current functional performance    Occupational performance deficits (Please refer to evaluation for details):  ADL's;IADL's;Leisure    Body Structure / Function / Physical Skills  ADL;ROM;Balance;IADL;Body mechanics;Improper spinal/pelvic alignment;Mobility;Strength;Flexibility;FMC;Coordination;UE functional use;Proprioception;Decreased knowledge of use of DME    Rehab Potential  Good    Clinical Decision Making  Several treatment options, min-mod task modification necessary    Comorbidities Affecting Occupational Performance:  Presence of comorbidities impacting occupational performance    Comorbidities impacting occupational performance description:  Wounds bilateral feet, Healed recent hip and pelvic fx's    Modification or Assistance to Complete Evaluation   Min-Moderate modification of tasks or assist with assess necessary to complete eval    OT Frequency  2x / week    OT Duration  8 weeks   plus eval   OT Treatment/Interventions  Self-care/ADL training;Therapeutic exercise;Functional Mobility Training;Neuromuscular education;Aquatic Therapy;Manual Therapy;Splinting;Therapeutic activities;Paraffin;DME and/or AE instruction;Cognitive remediation/compensation;Electrical Stimulation;Visual/perceptual remediation/compensation;Moist Heat;Passive range of motion;Patient/family education    Plan  Issue HEP to address Lt shoulder self ROM in supine, AA/ROM, and functionl low to mid level reaching, and simple coordination exercises    Consulted and Agree with Plan of Care  Patient;Family member/caregiver    Family Member Consulted  Daughter Unk Lightning)       Patient will benefit from skilled therapeutic intervention in order to improve the  following deficits and impairments:  Body Structure / Function / Physical Skills  Visit Diagnosis: Hemiplegia and hemiparesis following cerebral infarction affecting left non-dominant side ([place]) - Plan: Ot plan of care cert/re-cert  Stiffness of left shoulder, not elsewhere classified - Plan: Ot plan of care cert/re-cert  Chronic left shoulder pain - Plan: Ot plan of care cert/re-cert  Other lack of coordination - Plan: Ot plan of care cert/re-cert  Muscle weakness (generalized) - Plan: Ot plan of care cert/re-cert    Problem List Patient Active Problem List   Diagnosis Date Noted  . Lethargy 10/07/2018  . Pressure injury of skin 09/27/2018  . Pelvic fracture ([place]) 09/21/2018  . Allergic reaction caused by a drug 05/06/2018  . Bilateral leg edema 04/13/2018  . HLD (hyperlipidemia) 03/23/2018  . Mood swings 03/23/2018  . Chronic kidney disease (CKD), stage III (moderate) ([facility])   . Embolic stroke ([place]) 75/04/2584  . Left hemiparesis ([place])   . Diabetes mellitus type 2 in nonobese ([facility])   . Acute ischemic stroke ([place])   . Left arm weakness   . Acute CVA (cerebrovascular accident) ([place]) 02/24/2018  . Macular edema due to secondary diabetes ([place]) 02/24/2018  . Overweight (BMI 25.0-29.9) 02/24/2018  . CKD (chronic kidney disease), stage III ([place]) 02/24/2018  . Diabetes mellitus without complication ([place]) 27/78/2423  . HTN (hypertension) 09/02/2017  . Knee pain, bilateral 09/02/2017    Carey Bullocks, OTR/L 12/17/2018, 9:36 AM  [facility] [address] [place], [place], [address] Phone: [phone]   Fax:  [phone]  Name: SHERRIL SHIPMAN MRN: [id number] Date of Birth: Apr 25, 1946

## 2018-12-21 ENCOUNTER — Encounter: Payer: Self-pay | Admitting: Physical Therapy

## 2018-12-21 ENCOUNTER — Other Ambulatory Visit: Payer: Self-pay

## 2018-12-21 ENCOUNTER — Ambulatory Visit: Payer: Medicare Other | Admitting: Occupational Therapy

## 2018-12-21 ENCOUNTER — Encounter: Payer: Self-pay | Admitting: Occupational Therapy

## 2018-12-21 ENCOUNTER — Encounter: Payer: Self-pay | Admitting: Podiatry

## 2018-12-21 ENCOUNTER — Ambulatory Visit: Payer: Medicare Other | Admitting: Physical Therapy

## 2018-12-21 VITALS — BP 178/86 | HR 70 | Wt 165.5 lb

## 2018-12-21 DIAGNOSIS — I69354 Hemiplegia and hemiparesis following cerebral infarction affecting left non-dominant side: Secondary | ICD-10-CM | POA: Diagnosis not present

## 2018-12-21 DIAGNOSIS — M6281 Muscle weakness (generalized): Secondary | ICD-10-CM

## 2018-12-21 DIAGNOSIS — G8929 Other chronic pain: Secondary | ICD-10-CM

## 2018-12-21 DIAGNOSIS — R2681 Unsteadiness on feet: Secondary | ICD-10-CM

## 2018-12-21 DIAGNOSIS — R2689 Other abnormalities of gait and mobility: Secondary | ICD-10-CM

## 2018-12-21 DIAGNOSIS — M25612 Stiffness of left shoulder, not elsewhere classified: Secondary | ICD-10-CM

## 2018-12-21 DIAGNOSIS — R278 Other lack of coordination: Secondary | ICD-10-CM

## 2018-12-21 NOTE — Patient Instructions (Addendum)
   1.  Flip cards with Left hand  2.  Stack checkers with left hand   3.  Pick up anything small and place in container (bottle caps, coins, dice, checkers, dried pasta, buttons, etc).  4.  Upper Extremity: [employer]  down.  Hold a ball/shoe box/paper towel roll (shoulder width sized)  with arms straight and hands flat on either side. Start with ball on chest, then straighten elbows to push ball up to the ceiling trying to keep elbows next to body hold 3sec, then slowly lower by keeping elbows close to your side. Keep shoulders away from ears Repeat 10 times.  Rest, then do 10 more, 2x/day   Overhead Arm Extension - Supine     Lay down.  Hold a ball/shoe box/paper towel roll (shoulder width sized) on tops of legs with arms straight and hands flat on either side. Slowly move arms up/back in an arc with elbows straight and then slowly lower back down.  Just to shoulder height Keep shoulders away from ears. Repeat 10 times.    Rest, then do 10 more, 2x/day  Supine Reach    Use stronger arm to move involved arm straight up. Then slowly raise back.  KEEP THUMB UP  AND ELBOW STRAIGHT. Hold 10 seconds. Repeat 10 times. Do 1-2 sessions per day.

## 2018-12-21 NOTE — Therapy (Signed)
[facility] [address] [place], [place], [address] Phone: [phone]   Fax:  [phone]  Occupational Therapy Treatment  Patient Details  Name: [name] MRN: [id number] Date of Birth: 26-Jan-1946 Referring Provider (OT): Dr. Letta Pate   Encounter Date: 12/21/2018  OT End of Session - 12/21/18 0803    Visit Number  2    Number of Visits  17    Date for OT Re-Evaluation  02/16/19    Authorization Type  U[employer] MCR    Authorization - Visit Number  2    Authorization - Number of Visits  10    OT Start Time  0708    OT Stop Time  0750    OT Time Calculation (min)  42 min    Activity Tolerance  Patient tolerated treatment well    Behavior During Therapy  [facility] for tasks assessed/performed       Past Medical History:  Diagnosis Date  . Allergy   . Arthritis   . Cataract   . Diabetes mellitus without complication ([place])   . Hypertension   . Macular degeneration syndrome    with edema---being treated.     Past Surgical History:  Procedure Laterality Date  . callous removal  Left 2017   located on 1st digit on L foot 2/2 diabetes  . EYE SURGERY      There were no vitals filed for this visit.  Subjective Assessment - 12/21/18 0716    Subjective   can't wait to get the Botox    Patient is accompanied by:  Family member   Daughter Unk Lightning)   Pertinent History  CVA 02/27/18, pelvic fx and Lt hip  fx 09/2018. PMH: HTN, OA    Limitations  Fall risk, wounds on feet    Currently in Pain?  No/denies    Pain Onset  More than a month ago       CLINIC OPERATION CHANGES: Outpatient Neuro Rehab is open at lower capacity following universal masking, social distancing, and patient screening.  The patient's COVID risk of complications score is 5.  In supine, gentle joint mobs to L shoulder due to tightness.  In sidelying, AAROM shoulder flex with mod scapular facilitation for incr ROM.          OT Education  - 12/21/18 0802    Education Details  Initial HEP (simple coordination and shoulder ROM)--see pt instructions    Person(s) Educated  Patient;Child(ren)    Methods  Explanation;Demonstration;Verbal cues;Handout    Comprehension  Verbalized understanding;Returned demonstration;Verbal cues required   min facilitation with shoulder exercises initially, improved with repetition      OT Short Term Goals - 12/17/18 0917      OT SHORT TERM GOAL #1   Title  Independent with initial HEP for shoulder ROM, and functional reaching LUE - due 01/16/19    Time  4    Period  Weeks    Status  New      OT SHORT TERM GOAL #2   Title  Pt to perform UB dressing with set up only and LE dressing with mod assist or less using A/E prn    Time  4    Period  Weeks    Status  New      OT SHORT TERM GOAL #3   Title  Pt to increase LUE functional use as evidenced by performing 20 blocks on Box & Blocks test    Baseline  11 blocks  Time  4    Period  Weeks    Status  New      OT SHORT TERM GOAL #4   Title  Pt to perform toileting/clothes management with only min assist    Time  4    Period  Weeks    Status  New      OT SHORT TERM GOAL #5   Title  Pt to achieve 90* shoulder flexion LUE for mid level reaching    Time  4    Period  Weeks    Status  New        OT Long Term Goals - 12/17/18 5625      OT LONG TERM GOAL #1   Title  Independent with updated HEP - due 02/16/19    Time  8    Period  Weeks    Status  New      OT LONG TERM GOAL #2   Title  Pt to be mod I with all BADLS using A/E as needed (except shower transfers to still be supervision)    Time  8    Period  Weeks    Status  New      OT LONG TERM GOAL #3   Title  Pt to improve LUE function as evidenced by performing 25 blocks or greater on Box & Blocks test    Time  8    Period  Weeks    Status  New      OT LONG TERM GOAL #4   Title  Pt to improve coordination as evidenced by performing 9 hole peg test completely in 90 sec or  less    Baseline  placed 5 in 2 min.    Time  8    Period  Weeks    Status  New      OT LONG TERM GOAL #5   Title  Grip strength Lt hand to be 15 lbs or greater    Baseline  8 lbs    Time  8    Period  Weeks    Status  New      Long Term Additional Goals   Additional Long Term Goals  Yes      OT LONG TERM GOAL #6   Title  Pt to perform simple snack, cold food, and microwaveable food mod I level with DME prn without LOB    Time  8    Period  Weeks    Status  New      OT LONG TERM GOAL #7   Title  Pt to achieve 110* or greater P/ROM Lt shoulder with pain less than or equal to 5/10    Time  8    Period  Weeks    Status  New            Plan - 12/21/18 6389    Clinical Impression Statement  Pt responded well to cueing for movement quality/normal movement patterns.  Pt will benefit from continued neuro re-ed.    OT Occupational Profile and History  Detailed Assessment- Review of Records and additional review of physical, cognitive, psychosocial history related to current functional performance    Occupational performance deficits (Please refer to evaluation for details):  ADL's;IADL's;Leisure    Body Structure / Function / Physical Skills  ADL;ROM;Balance;IADL;Body mechanics;Improper spinal/pelvic alignment;Mobility;Strength;Flexibility;FMC;Coordination;UE functional use;Proprioception;Decreased knowledge of use of DME    Rehab Potential  Good    Clinical Decision Making  Several treatment options, min-mod  task modification necessary    Comorbidities Affecting Occupational Performance:  Presence of comorbidities impacting occupational performance    Comorbidities impacting occupational performance description:  Wounds bilateral feet, Healed recent hip and pelvic fx's    Modification or Assistance to Complete Evaluation   Min-Moderate modification of tasks or assist with assess necessary to complete eval    OT Frequency  2x / week    OT Duration  8 weeks   plus eval   OT  Treatment/Interventions  Self-care/ADL training;Therapeutic exercise;Functional Mobility Training;Neuromuscular education;Aquatic Therapy;Manual Therapy;Splinting;Therapeutic activities;Paraffin;DME and/or AE instruction;Cognitive remediation/compensation;Electrical Stimulation;Visual/perceptual remediation/compensation;Moist Heat;Passive range of motion;Patient/family education    Plan  AA/ROM, and functional low to mid level reaching, neuro re-ed    Consulted and Agree with Plan of Care  Patient;Family member/caregiver    Family Member Consulted  Daughter Unk Lightning)       Patient will benefit from skilled therapeutic intervention in order to improve the following deficits and impairments:  Body Structure / Function / Physical Skills  Visit Diagnosis: Hemiplegia and hemiparesis following cerebral infarction affecting left non-dominant side ([place]) - Plan:  Stiffness of left shoulder, not elsewhere classified - Plan:  Chronic left shoulder pain - Plan:   Other lack of coordination - Plan:   Muscle weakness (generalized) - Plan:     Problem List Patient Active Problem List   Diagnosis Date Noted  . Lethargy 10/07/2018  . Pressure injury of skin 09/27/2018  . Pelvic fracture ([place]) 09/21/2018  . Allergic reaction caused by a drug 05/06/2018  . Bilateral leg edema 04/13/2018  . HLD (hyperlipidemia) 03/23/2018  . Mood swings 03/23/2018  . Chronic kidney disease (CKD), stage III (moderate) (HCC)   . Embolic stroke ([place]) 16/04/9603  . Left hemiparesis ([place])   . Diabetes mellitus type 2 in nonobese (HCC)   . Acute ischemic stroke ([place])   . Left arm weakness   . Acute CVA (cerebrovascular accident) ([place]) 02/24/2018  . Macular edema due to secondary diabetes ([place]) 02/24/2018  . Overweight (BMI 25.0-29.9) 02/24/2018  . CKD (chronic kidney disease), stage III ([place]) 02/24/2018  . Diabetes mellitus without complication ([place]) 54/03/8118  . HTN (hypertension) 09/02/2017  . Knee pain, bilateral  09/02/2017    [facility] 12/21/2018, 9:35 AM  [facility] [address] [place], [place], [address] Phone: [phone]   Fax:  [phone]  Name: [name] MRN: [id number] Date of Birth: Nov 24, 1945   Vianne Bulls, OTR/L [facility] [address] [place], [place]  [address] [phone] phone [phone] 12/21/18 9:35 AM

## 2018-12-21 NOTE — Therapy (Signed)
[facility] [address] [place], [place], [address] Phone: [phone]   Fax:  [phone]  Physical Therapy Evaluation  Patient Details  Name: [name] MRN: [id number] Date of Birth: 03/27/1946 Referring Provider (PT): Alysia Penna, MD  CLINIC OPERATION CHANGES: Outpatient Neuro Rehab is open at lower capacity following universal masking, social distancing, and patient screening.  The patient's COVID risk of complications score is 6.   Encounter Date: 12/21/2018  PT End of Session - 12/21/18 1011    Visit Number  1    Number of Visits  21    Date for PT Re-Evaluation  02/19/19    Authorization Type  UHC Medicare, 10th visit progress note    PT Start Time  0800    PT Stop Time  0844    PT Time Calculation (min)  44 min    Equipment Utilized During Treatment  Gait belt    Activity Tolerance  Patient tolerated treatment well;Patient limited by fatigue    Behavior During Therapy  WFL for tasks assessed/performed       Past Medical History:  Diagnosis Date  . Allergy   . Arthritis   . Cataract   . Diabetes mellitus without complication ([place])   . Hypertension   . Macular degeneration syndrome    with edema---being treated.     Past Surgical History:  Procedure Laterality Date  . callous removal  Left 2017   located on 1st digit on L foot 2/2 diabetes  . EYE SURGERY      Vitals:   12/21/18 0809  BP: (!) 178/86  Pulse: 70  Weight: 165 lb 8 oz (75.1 kg)     Subjective Assessment - 12/21/18 0802    Subjective  Patient states that she was walking with a cane after her R CVA in August. Fell in 09/2018 with multiple fxs and NWB status and had acquired 2 pressure ulcers from hospitalization on bottom of heels. Patient states that she wants to get back to walking and get stronger.    Patient is accompained by:  Family member   [name], [name]   Pertinent History  Patient had a R CVA 02/2018 and was  walking with a SPC prior to a fall in March 2020 resulting in being hospitalized with multiple pelvic fxs and a L hip fx. During hospital stay, patient acquired pressure ulcers on bilateral soles of feet near heels.Patient is now safely able to bear weight and pressure ulcers are now a stage 1 (per [name] report and photos).    Limitations  Standing;Walking;House hold activities    Patient Stated Goals  "Be back to normal, get back to walking, and getting strength back"    Currently in Pain?  No/denies         [facility] PT Assessment - 12/21/18 4259      Assessment   Medical Diagnosis  Left hemiparesis from Rt CVA 02/27/18    Referring Provider (PT)  Alysia Penna, MD    Onset Date/Surgical Date  02/27/18   Pt had multiple pelvic fxs/Left hip fx in March 2020   Hand Dominance  Right    Prior Therapy  Inpatient rehab, [facility] PT      Precautions   Precautions  Fall   wounds healing, bottom of feet on heels     Restrictions   Weight Bearing Restrictions  No      Balance Screen   Has the patient fallen in the past 6 months  Yes   while using SPC to crush a spider   How many times?  1    Has the patient had a decrease in activity level because of a fear of falling?   Yes      Home Environment   Living Environment  Private residence    Living Arrangements  --   currently with [name], patients husband   Available Help at Discharge  --   [name], patient's husband   Type of [place] Access  Level entry;Stairs to enter   wheelchair ramp   Entrance Stairs-Number of Steps  3 steps    Entrance Stairs-Rails  Right;Left    Home Layout  One level    [place] - single point;Cane - quad;Walker - 4 wheels;Transport chair      Prior Function   Level of Independence  Independent with household mobility with device   was walking using SPC prior to fxs in March 2020     Cognition   Overall Cognitive Status  Within Functional Limits for tasks assessed       Observation/Other Assessments   Observations  [name] [name] showed current photos of patient's stage 1 pressure ulcer to bilateral soles of feet on heels      Sensation   Light Touch  Appears Intact      Coordination   Gross Motor Movements are Fluid and Coordinated  Yes    Coordination and Movement Description  Able to perform, with compensatory posterior/lateral trunk lean bilaterally      Posture/Postural Control   Posture/Postural Control  Postural limitations    Postural Limitations  Rounded Shoulders;Forward head;Increased thoracic kyphosis;Posterior pelvic tilt    Posture Comments  Patient has difficulty holding trunk erect >5 seconds during static standing and gait with rolling walker.      ROM / Strength   AROM / PROM / Strength  Strength;AROM      AROM   Overall AROM   Deficits    Overall AROM Comments  Unable to reach terminal knee extension in short sitting on L, decreased DF noted on L; with difficulty clearing L foot during swing phase of gait      Strength   Overall Strength  Deficits    Overall Strength Comments  Limited pressure provided due to recent pelvic and hip fxs. Grossly 4/5 hip flexion B, 3-/5 L knee extension, 3+/5 R knee extension, 3-/5 L DF, 4/5 R DF     Strength Assessment Site  Hip;Knee;Ankle      Transfers   Transfers  Sit to Stand;Stand to Sit    Sit to Stand  3: Mod assist;With upper extremity assist   From low mat table, takes increased time, weight shifted ant   Stand to Sit  3: Mod assist;With upper extremity assist    Stand to Sit Details (indicate cue type and reason)  Verbal cues for technique   For slow descent and using R UE to lower    Comments  While performing needs UE assistance from table for balance support. At home patient uses bed rails to assist with transfers. Stand step transfer with mod A, verbal cueing for techniques, requires UE support on table to perform.       Ambulation/Gait   Ambulation/Gait  Yes    Ambulation/Gait  Assistance  3: Mod assist    Ambulation/Gait Assistance Details  Patient ambulated 45 feet with rolling walker and mod A from therapist with close transport  chair follow. Patient initially demonstrates a step through pattern with cueing, however when patient fatigues patient demonstrates a step to pattern with decreased step length on R LE and has increased difficulty clearing L foot during swing phase. Requires verbal cues throughout for upright posture and to look ahead. Has difficulty keeping trunk erect.     Ambulation Distance (Feet)  45 Feet    Assistive device  Rolling walker    Gait Pattern  Step-to pattern;Step-through pattern;Decreased step length - right;Decreased stance time - left;Decreased dorsiflexion - left;Decreased weight shift to left;Trunk flexed;Poor foot clearance - left    Ramp  3: Mod assist    Ramp Details (indicate cue type and reason)  While weighing patient on scale, patient needed mod A from therapist to lift L LE onto ramp due to decreased L knee flexion and ankle DF strength.     Pre-Gait Activities  Static standing x1 minute, mod A from therapist. Cueing for upright posture, patient has weigt shifted anteriorly.       Balance   Balance Assessed  Yes      Standardized Balance Assessment   Standardized Balance Assessment  Berg Balance Test      Berg Balance Test   Sit to Stand  Needs moderate or maximal assist to stand    Standing Unsupported  Unable to stand 30 seconds unassisted    Sitting with Back Unsupported but Feet Supported on Floor or Stool  Able to sit safely and securely 2 minutes    Stand to Sit  Controls descent by using hands    Transfers  Needs one person to assist    Standing Unsupported with Eyes Closed  Needs help to keep from falling    Standing Unsupported with Feet Together  Able to place feet together independently but unable to hold for 30 seconds    From Standing, Reach Forward with Outstretched Arm  Loses balance while trying/requires  external support    From Standing Position, Pick up Object from Floor  Unable to try/needs assist to keep balance    From Standing Position, Turn to Look Behind Over each Shoulder  Needs supervision when turning    Turn 360 Degrees  Needs assistance while turning    Standing Unsupported, Alternately Place Feet on Step/Stool  Needs assistance to keep from falling or unable to try    Standing Unsupported, One Foot in [employer] balance while stepping or standing    Standing on One Leg  Unable to try or needs assist to prevent fall    Total Score  11    Berg comment:  11/56, significant risk of falling                 Objective measurements completed on examination: See above findings.              PT Education - 12/21/18 1009    Education Details  Educated patient and [name] on POC and clinical findings during evaluation.    Person(s) Educated  Patient;Child(ren)    Methods  Explanation;Demonstration    Comprehension  Verbalized understanding       PT Short Term Goals - 12/21/18 1025      PT SHORT TERM GOAL #1   Title  Patient will be independent with initial HEP focusing on bilateral LE strength and balance to build upon gains made in therapy.    Time  4    Period  Weeks    Status  New  Target Date  01/20/19      PT SHORT TERM GOAL #2   Title  Patient will increase BERG score to at least a 20/56 in order to help decrease risk of future falls.    Baseline  11/56    Time  4    Period  Weeks    Status  New    Target Date  01/20/19      PT SHORT TERM GOAL #3   Title  Patient will perform sit <> stand with min A from therapist and no UE support in order to help decrease caregiver burden at home during transfers.    Baseline  Mod A, requires UE support for balance stability    Time  4    Period  Weeks    Status  New    Target Date  01/20/19      PT SHORT TERM GOAL #4   Title  Patient will ambulate 100 ft with LRAD and CGA from therapist in order to  improve household functional mobility.    Baseline  45 feet, mod A from therapist using RW    Time  4    Period  Weeks    Status  New    Target Date  01/20/19      PT SHORT TERM GOAL #5   Title  Patient will participate in further assessment of gait speed.    Time  4    Period  Weeks    Status  New    Target Date  01/20/19      Additional Short Term Goals   Additional Short Term Goals  Yes      PT SHORT TERM GOAL #6   Title  Patient will perform all bed mobility with supervision in order to decrease caregiver burden and increase independence.    Time  4    Period  Weeks    Status  New    Target Date  01/20/19        PT Long Term Goals - 12/21/18 1028      PT LONG TERM GOAL #1   Title  Patient will be independent with final HEP in order to continue building upon functional gains made in therapy.    Time  8    Period  Weeks    Status  New    Target Date  02/19/19      PT LONG TERM GOAL #2   Title  Patient will ambulate at least 150 feet with LRAD and supervision in order to improve functional mobility in the household and community.    Baseline  45 feet with RW, mod A from therapist    Time  8    Period  Weeks    Status  New    Target Date  02/19/19      PT LONG TERM GOAL #3   Title  Patient will increase BERG score to at least 35/56 in order to decrease risk of future falls.    Baseline  11/56 on 12/21/18    Time  8    Period  Weeks    Target Date  02/19/19      PT LONG TERM GOAL #4   Title  Patient will perform functional transfers and sit <> stands with mod I in order to decrease caregiver burden.    Baseline  Mod A with stand step transfers and sit <> stand from low surface    Time  8  Period  Weeks    Status  New    Target Date  02/19/19      PT LONG TERM GOAL #5   Title  Patient will increase gait speed to at least 1.8 ft/sec in order to decrease risk of falls and improve household ambulation.    Time  8    Period  Weeks    Status  New    Target  Date  02/19/19      Additional Long Term Goals   Additional Long Term Goals  Yes      PT LONG TERM GOAL #6   Title  Patient will ascend/descend 3 steps using LRAD with supervision in order to enter/exit home without use of a ramp.    Time  8    Period  Weeks    Status  New    Target Date  02/19/19             Plan - 12/21/18 1013    Clinical Impression Statement  Patient is a 73 year old female referred to Neuro OPPT for evaluation of functional deficits s/p R CVA 8/19 and s/p fall with resulting pelvic fractures, L hip fracture in March 2020 with acquired bilateral heel pressure ulcers from hospitalization.  Pt's PMH is significant for the following: hypertension, diabetes. The following deficits were noted during pt's exam: impaired static and dynamic standing balance, gait abnormalities, decreased LE strength, and impaired postural control. Pt's Berg scores indicate pt is at a significant risk for falls. Pt would benefit from skilled PT to address these impairments and functional limitations to maximize functional mobility independence to increase safety at home.    Personal Factors and Comorbidities  Past/Current Experience;Time since onset of injury/illness/exacerbation;Comorbidity 3+    Comorbidities  Hypertension, Diabetes, Chronic kidney disease    Examination-Activity Limitations  Locomotion Level;Transfers;Stand;Squat    Examination-Participation Restrictions  Community Activity    Stability/Clinical Decision Making  Evolving/Moderate complexity    Clinical Decision Making  Moderate    Rehab Potential  Good    PT Frequency  Other (comment)   3x week/4 weeks, 2x week/4 weeks   PT Duration  8 weeks    PT Treatment/Interventions  ADLs/Self Care Home Management;Gait training;DME Instruction;Stair training;Functional mobility training;Therapeutic activities;Patient/family education;Neuromuscular re-education;Balance training;Therapeutic exercise;Passive range of motion    PT  Next Visit Plan  Assess bed mobility. Perform gait speed assessment.  Stairs.  Initial HEP for LE strengthening/balance/upright posture. Static standing balance.    Consulted and Agree with Plan of Care  Patient;Family member/caregiver    Family Member Consulted  [name], [name]       Patient will benefit from skilled therapeutic intervention in order to improve the following deficits and impairments:  Abnormal gait, Decreased activity tolerance, Decreased balance, Decreased range of motion, Decreased mobility, Decreased endurance, Decreased skin integrity, Decreased strength, Difficulty walking, Impaired perceived functional ability, Postural dysfunction  Visit Diagnosis: Unsteadiness on feet  Other abnormalities of gait and mobility  Muscle weakness (generalized) Hemiplegia and hemiparesis following cerebral infarction affecting left non-dominant side [facility])      Problem List Patient Active Problem List   Diagnosis Date Noted  . Lethargy 10/07/2018  . Pressure injury of skin 09/27/2018  . Pelvic fracture ([place]) 09/21/2018  . Allergic reaction caused by a drug 05/06/2018  . Bilateral leg edema 04/13/2018  . HLD (hyperlipidemia) 03/23/2018  . Mood swings 03/23/2018  . Chronic kidney disease (CKD), stage III (moderate) (HCC)   . Embolic stroke ([place]) 92/33/0076  .  Left hemiparesis ([place])   . Diabetes mellitus type 2 in nonobese (H[facility])   . Acute ischemic stroke ([place])   . Left arm weakness   . Acute CVA (cerebrovascular accident) ([place]) 02/24/2018  . 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 Overweight (BMI 25.0-29.9) 02/24/2018  . CKD (chronic kidney disease), stage III ([place]) 02/24/2018  . Diabetes mellitus without complication ([place]) 57/50/5183  . HTN (hypertension) 09/02/2017  . Knee pain, bilateral 09/02/2017    Arliss Journey, PT, DPT 12/21/2018, 3:32 PM  [facility] [address] [place], [place], [address] Phone: [phone]   Fax:  [phone]  Name: [name] MRN: [id number] Date of Birth: Dec 14, 1945

## 2018-12-25 ENCOUNTER — Encounter: Payer: Self-pay | Admitting: Family Medicine

## 2018-12-25 ENCOUNTER — Ambulatory Visit: Payer: Medicare Other | Admitting: Physical Therapy

## 2018-12-28 ENCOUNTER — Encounter: Payer: Self-pay | Admitting: Podiatry

## 2018-12-30 ENCOUNTER — Encounter: Payer: Self-pay | Admitting: Physical Therapy

## 2018-12-30 ENCOUNTER — Ambulatory Visit: Payer: Medicare Other | Admitting: Physical Therapy

## 2018-12-30 ENCOUNTER — Other Ambulatory Visit: Payer: Self-pay

## 2018-12-30 DIAGNOSIS — M6281 Muscle weakness (generalized): Secondary | ICD-10-CM

## 2018-12-30 DIAGNOSIS — I69354 Hemiplegia and hemiparesis following cerebral infarction affecting left non-dominant side: Secondary | ICD-10-CM | POA: Diagnosis not present

## 2018-12-30 DIAGNOSIS — R2689 Other abnormalities of gait and mobility: Secondary | ICD-10-CM

## 2018-12-30 DIAGNOSIS — R2681 Unsteadiness on feet: Secondary | ICD-10-CM

## 2018-12-30 NOTE — Patient Instructions (Signed)
Access Code: [email or web address]  URL: [email or web address]  Date: 12/30/2018  Prepared by: Willow Ora   Exercises  Supine Bridge - 10 reps - 1 sets - 5 hold - 1x daily - 5x weekly  Bent Knee Fallouts - 10 reps - 1 sets - 1x daily - 5x weekly  Sit to Stand with Counter Support - 10 reps - 1 sets - 1x daily - 5x weekly  Standing March with Counter Support - 10 reps - 1 sets - 1x daily - 5x weekly  Mini Squat with Counter Support - 10 reps - 1 sets - 1x daily - 5x weekly

## 2018-12-31 ENCOUNTER — Encounter (HOSPITAL_BASED_OUTPATIENT_CLINIC_OR_DEPARTMENT_OTHER): Payer: Medicare Other | Admitting: Physical Medicine & Rehabilitation

## 2018-12-31 ENCOUNTER — Encounter: Payer: Self-pay | Admitting: Physical Medicine & Rehabilitation

## 2018-12-31 VITALS — BP 166/58 | HR 77 | Temp 98.2°F | Ht 62.0 in | Wt 160.0 lb

## 2018-12-31 DIAGNOSIS — G8114 Spastic hemiplegia affecting left nondominant side: Secondary | ICD-10-CM

## 2018-12-31 NOTE — Progress Notes (Signed)
Botox Injection for spasticity using needle EMG guidance  Dilution: 50 Units/ml Indication: Severe spasticity which interferes with ADL,mobility and/or  hygiene and is unresponsive to medication management and other conservative care Informed consent was obtained after describing risks and benefits of the procedure with the patient. This includes bleeding, bruising, infection, excessive weakness, or medication side effects. A REMS form is on file and signed. Needle: 27g 1" needle electrode Number of units per muscle LEFT  Pec 100 Bic 75 BR 25 FDS 25 FDP50 FCR 25 All injections were done after obtaining appropriate EMG activity and after negative drawback for blood. The patient tolerated the procedure well. Post procedure instructions were given. A followup appointment was made.

## 2018-12-31 NOTE — Therapy (Signed)
[facility] [address] [place], [place], [address] Phone: [phone]   Fax:  [phone]  Physical Therapy Treatment  Patient Details  Name: [name] MRN: [id number] Date of Birth: 29-Sep-1945 Referring Provider (PT): Alysia Penna, MD   Encounter Date: 12/30/2018   CLINIC OPERATION CHANGES: Outpatient Neuro Rehab is open at lower capacity following universal masking, social distancing, and patient screening.    12/30/18 1103  PT Visits / Re-Eval  Visit Number 2  Number of Visits 21  Date for PT Re-Evaluation 02/19/19  Authorization  Authorization Type [employer] Medicare, 10th visit progress note  PT Time Calculation  PT Start Time 1100  PT Stop Time 1145  PT Time Calculation (min) 45 min  PT - End of Session  Equipment Utilized During Treatment Gait belt  Activity Tolerance Patient tolerated treatment well;Patient limited by fatigue  Behavior During Therapy WFL for tasks assessed/performed      Past Medical History:  Diagnosis Date  . Allergy   . Arthritis   . Cataract   . Diabetes mellitus without complication ([place])   . Hypertension   . Macular degeneration syndrome    with edema---being treated.     Past Surgical History:  Procedure Laterality Date  . callous removal  Left 2017   located on 1st digit on L foot 2/2 diabetes  . EYE SURGERY      There were no vitals filed for this visit.     12/30/18 1100  Symptoms/Limitations  Subjective No new complaints. No falls or pain to report. Both pt and daughter report heels are getting better. Has on new diabetic shoes she reports feel comfortable.  Patient is accompained by: Family member (daughter)  Pertinent History Patient had a R CVA 02/2018 and was walking with a SPC prior to a fall in March 2020 resulting in being hospitalized with multiple pelvic fxs and a L hip fx. During hospital stay, patient acquired pressure ulcers on bilateral  soles of feet near heels.Patient is now safely able to bear weight and pressure ulcers are now a stage 1 (per daughter report and photos).  Patient Stated Goals "Be back to normal, get back to walking, and getting strength back"  Pain Assessment  Currently in Pain? No/denies  Pain Score 0      12/30/18 1104  Bed Mobility  Bed Mobility Supine to Sit;Sit to Supine;Rolling Right;Rolling Left  Rolling Right Supervision/verbal cueing  Rolling Right Details (indicate cue type and reason) cues needed for technique to ease movement and to ensure she brough her shoulder along as well with rolling both ways  Rolling Left Supervision/Verbal cueing  Supine to Sit Minimal Assistance - Patient > 75%  Sit to Supine Minimal Assistance - Patient > 75%  Sit to Supine - Details (indicate cue type and reason) cues needed for technique to lie down on side, roll to back and reverse process to sit up at edge of mat.   Transfers  Transfers Sit to Stand;Stand to [employer] Transfers  Sit to Stand 4: Min assist;With upper extremity assist;From chair/3-in-1  Sit to Stand Details Manual facilitation for weight shifting;Verbal cues for sequencing;Verbal cues for precautions/safety;Verbal cues for technique  Stand to Sit 4: Min assist;With upper extremity assist;To bed  Stand to Sit Details (indicate cue type and reason) Verbal cues for sequencing;Verbal cues for technique;Verbal cues for precautions/safety;Verbal cues for safe use of DME/AE;Manual facilitation for weight shifting  Stand Pivot Transfers 4: Min assist  Stand Pivot  Transfer Details (indicate cue type and reason) cues needed for posture and to advance feet.   Ambulation/Gait  Stairs Yes  Stairs Assistance 4: Min assist  Stairs Assistance Details (indicate cue type and reason) cues for sequencing and to advance hands on rails. no instability noted, assist to balance and power up needed.  Stair Management Technique Two rails;Step to pattern;Forwards   Number of Stairs 4  Height of Stairs 6    issued the following to HEP today with cues on correct form and technique.    Access Code: [email or web address]  URL: [email or web address]  Date: 12/30/2018  Prepared by: Willow Ora   Exercises  Supine Bridge - 10 reps - 1 sets - 5 hold - 1x daily - 5x weekly  Bent Knee Fallouts - 10 reps - 1 sets - 1x daily - 5x weekly  Sit to Stand with Counter Support - 10 reps - 1 sets - 1x daily - 5x weekly  Standing March with Counter Support - 10 reps - 1 sets - 1x daily - 5x weekly  Mini Squat with Counter Support - 10 reps - 1 sets - 1x daily - 5x weekly      PT Education - 12/30/18 1143    Education Details  HEP. bed mobility, stair technique.    Person(s) Educated  Patient;Child(ren)    Methods  Explanation;Demonstration;Verbal cues;Handout    Comprehension  Verbalized understanding;Returned demonstration;Verbal cues required;Need further instruction       PT Short Term Goals - 12/21/18 1025      PT SHORT TERM GOAL #1   Title  Patient will be independent with initial HEP focusing on bilateral LE strength and balance to build upon gains made in therapy.    Time  4    Period  Weeks    Status  New    Target Date  01/20/19      PT SHORT TERM GOAL #2   Title  Patient will increase BERG score to at least a 20/56 in order to help decrease risk of future falls.    Baseline  11/56    Time  4    Period  Weeks    Status  New    Target Date  01/20/19      PT SHORT TERM GOAL #3   Title  Patient will perform sit <> stand with min A from therapist and no UE support in order to help decrease caregiver burden at home during transfers.    Baseline  Mod A, requires UE support for balance stability    Time  4    Period  Weeks    Status  New    Target Date  01/20/19      PT SHORT TERM GOAL #4   Title  Patient will ambulate 100 ft with LRAD and CGA from therapist in order to improve household functional mobility.    Baseline  45 feet, mod A  from therapist using RW    Time  4    Period  Weeks    Status  New    Target Date  01/20/19      PT SHORT TERM GOAL #5   Title  Patient will participate in further assessment of gait speed.    Time  4    Period  Weeks    Status  New    Target Date  01/20/19      Additional Short Term Goals   Additional Short Term Goals  Yes  PT SHORT TERM GOAL #6   Title  Patient will perform all bed mobility with supervision in order to decrease caregiver burden and increase independence.    Time  4    Period  Weeks    Status  New    Target Date  01/20/19        PT Long Term Goals - 12/21/18 1028      PT LONG TERM GOAL #1   Title  Patient will be independent with final HEP in order to continue building upon functional gains made in therapy.    Time  8    Period  Weeks    Status  New    Target Date  02/19/19      PT LONG TERM GOAL #2   Title  Patient will ambulate at least 150 feet with LRAD and supervision in order to improve functional mobility in the household and community.    Baseline  45 feet with RW, mod A from therapist    Time  8    Period  Weeks    Status  New    Target Date  02/19/19      PT LONG TERM GOAL #3   Title  Patient will increase BERG score to at least 35/56 in order to decrease risk of future falls.    Baseline  11/56 on 12/21/18    Time  8    Period  Weeks    Target Date  02/19/19      PT LONG TERM GOAL #4   Title  Patient will perform functional transfers and sit <> stands with mod I in order to decrease caregiver burden.    Baseline  Mod A with stand step transfers and sit <> stand from low surface    Time  8    Period  Weeks    Status  New    Target Date  02/19/19      PT LONG TERM GOAL #5   Title  Patient will increase gait speed to at least 1.8 ft/sec in order to decrease risk of falls and improve household ambulation.    Time  8    Period  Weeks    Status  New    Target Date  02/19/19      Additional Long Term Goals   Additional Long  Term Goals  Yes      PT LONG TERM GOAL #6   Title  Patient will ascend/descend 3 steps using LRAD with supervision in order to enter/exit home without use of a ramp.    Time  8    Period  Weeks    Status  New    Target Date  02/19/19         12/30/18 1103  Plan  Clinical Impression Statement Today's skilled session looked at pt's baseline function for bed mobility and stairs with minimal assistance with cues needed. Remainder of session focused on education on and issuing of an HEP to work on strengthening. No issues reported in session. The pt is progressing toward goals and should benefit from continued PT to progress toward unmet goals.  Personal Factors and Comorbidities Past/Current Experience;Time since onset of injury/illness/exacerbation;Comorbidity 3+  Comorbidities Hypertension, Diabetes, Chronic kidney disease  Examination-Activity Limitations Locomotion Level;Transfers;Stand;Squat  Examination-Participation Restrictions Community Activity  Pt will benefit from skilled therapeutic intervention in order to improve on the following deficits Abnormal gait;Decreased activity tolerance;Decreased balance;Decreased range of motion;Decreased mobility;Decreased endurance;Decreased skin integrity;Decreased strength;Difficulty walking;Impaired perceived functional ability;Postural  dysfunction  Stability/Clinical Decision Making Evolving/Moderate complexity  Rehab Potential Good  PT Frequency Other (comment) (3x week/4 weeks, 2x week/4 weeks)  PT Duration 8 weeks  PT Treatment/Interventions ADLs/Self Care Home Management;Gait training;DME Instruction;Stair training;Functional mobility training;Therapeutic activities;Patient/family education;Neuromuscular re-education;Balance training;Therapeutic exercise;Passive range of motion  PT Next Visit Plan Perform gait speed assessment.  continued to work on static standing balance, LE strengthening and gait with RW.  Consulted and Agree with Plan of  Care Patient;Family member/caregiver  Family Member Consulted Daughter, [name]          Patient will benefit from skilled therapeutic intervention in order to improve the following deficits and impairments:  Abnormal gait, Decreased activity tolerance, Decreased balance, Decreased range of motion, Decreased mobility, Decreased endurance, Decreased skin integrity, Decreased strength, Difficulty walking, Impaired perceived functional ability, Postural dysfunction  Visit Diagnosis: 1. Hemiplegia and hemiparesis following cerebral infarction affecting left non-dominant side ([place])   2. Muscle weakness (generalized)   3. Unsteadiness on feet   4. Other abnormalities of gait and mobility        Problem List Patient Active Problem List   Diagnosis Date Noted  . Lethargy 10/07/2018  . Pressure injury of skin 09/27/2018  . Pelvic fracture ([place]) 09/21/2018  . Allergic reaction caused by a drug 05/06/2018  . Bilateral leg edema 04/13/2018  . HLD (hyperlipidemia) 03/23/2018  . Mood swings 03/23/2018  . Chronic kidney disease (CKD), stage III (moderate) ([facility])   . Embolic stroke ([place]) 70/62/3762  . Left hemiparesis ([place])   . Diabetes mellitus type 2 in nonobese ([facility])   . Acute ischemic stroke ([place])   . Left arm weakness   . Acute CVA (cerebrovascular accident) ([place]) 02/24/2018  . Macular edema due to secondary diabetes ([place]) 02/24/2018  . Overweight (BMI 25.0-29.9) 02/24/2018  . CKD (chronic kidney disease), stage III ([place]) 02/24/2018  . Diabetes mellitus without complication ([place]) 83/15/1761  . HTN (hypertension) 09/02/2017  . Knee pain, bilateral 09/02/2017    Willow Ora, PTA, [facility] [address] [place], [place] [address] [phone] 12/31/18, 7:18 PM   Name: [name] MRN: [id number] Date of Birth: 1946/06/29

## 2018-12-31 NOTE — Patient Instructions (Signed)

## 2019-01-01 ENCOUNTER — Ambulatory Visit: Payer: Medicare Other | Admitting: Physical Therapy

## 2019-01-01 ENCOUNTER — Encounter: Payer: Self-pay | Admitting: Physical Therapy

## 2019-01-01 ENCOUNTER — Other Ambulatory Visit: Payer: Self-pay

## 2019-01-01 DIAGNOSIS — M6281 Muscle weakness (generalized): Secondary | ICD-10-CM

## 2019-01-01 DIAGNOSIS — R2689 Other abnormalities of gait and mobility: Secondary | ICD-10-CM

## 2019-01-01 DIAGNOSIS — I69354 Hemiplegia and hemiparesis following cerebral infarction affecting left non-dominant side: Secondary | ICD-10-CM | POA: Diagnosis not present

## 2019-01-01 DIAGNOSIS — R2681 Unsteadiness on feet: Secondary | ICD-10-CM

## 2019-01-01 NOTE — Therapy (Signed)
[facility] [address] [place], [place], [address] Phone: [phone]   Fax:  [phone]  Physical Therapy Treatment  Patient Details  Name: [name] MRN: [id number] Date of Birth: [birth year] Referring Provider (PT): Alysia Penna, MD   Encounter Date: 01/01/2019   CLINIC OPERATION CHANGES: Outpatient Neuro Rehab is open at lower capacity following universal masking, social distancing, and patient screening.  The patient's COVID risk of complications score is 5.   PT End of Session - 01/01/19 1250    Visit Number  3    Number of Visits  21    Date for PT Re-Evaluation  02/19/19    Authorization Type  [employer] Medicare, 10th visit progress note    PT Start Time  0901    PT Stop Time  0946    PT Time Calculation (min)  45 min    Equipment Utilized During Treatment  Gait belt    Activity Tolerance  Patient tolerated treatment well    Behavior During Therapy  WFL for tasks assessed/performed       Past Medical History:  Diagnosis Date  . Allergy   . Arthritis   . Cataract   . Diabetes mellitus without complication ([place])   . Hypertension   . Macular degeneration syndrome    with edema---being treated.     Past Surgical History:  Procedure Laterality Date  . callous removal  Left 2017   located on 1st digit on L foot 2/2 diabetes  . EYE SURGERY      There were no vitals filed for this visit.  Subjective Assessment - 01/01/19 0903    Subjective  Was very fatigued after last session; slept the best she has slept in 6 months.  Went to see Dr. Letta Pate for botox injections and LUE is feeling better.    Patient is accompained by:  Family member   daughter   Pertinent History  Patient had a R CVA 02/2018 and was walking with a SPC prior to a fall in March 2020 resulting in being hospitalized with multiple pelvic fxs and a L hip fx. During hospital stay, patient acquired pressure ulcers on bilateral soles  of feet near heels.Patient is now safely able to bear weight and pressure ulcers are now a stage 1 (per daughter report and photos).    Patient Stated Goals  "Be back to normal, get back to walking, and getting strength back"    Currently in Pain?  No/denies         [facility] PT Assessment - 01/01/19 0905      Ambulation/Gait   Ambulation/Gait  Yes    Ambulation/Gait Assistance  3: Mod assist    Ambulation/Gait Assistance Details  As pt fatigued she demonstrated increased L hip ADD and knee/hip flexion with frequent cues to extend hip and knee with upright trunk and gaze and for full weight shift to R to allow full step length LLE    Ambulation Distance (Feet)  115 Feet    Assistive device  Rolling walker    Gait Pattern  Step-to pattern;Step-through pattern;Decreased step length - right;Decreased stride length;Decreased step length - left;Decreased hip/knee flexion - left;Decreased dorsiflexion - left;Decreased weight shift to right;Left flexed knee in stance;Right flexed knee in stance;Lateral hip instability;Trunk flexed;Poor foot clearance - left    Ambulation Surface  Level;Indoor      Standardized Balance Assessment   Standardized Balance Assessment  10 meter walk test    10 Meter Walk  1.09 minutes or .47 ft/sec                   [facility] Adult PT Treatment/Exercise - 01/01/19 0905      Bed Mobility   Bed Mobility  Supine to Sit;Sit to Supine    Supine to Sit  Minimal Assistance - Patient > 75%    Sit to Supine  Minimal Assistance - Patient > 75%      Transfers   Transfers  Sit to Stand;Stand to Sit;Stand Pivot Transfers    Sit to Stand  4: Min assist;With upper extremity assist;From chair/3-in-1    Sit to Stand Details (indicate cue type and reason)  cues for hand placement and to maintain upright trunk during sit > stand to translate COG over BOS    Stand to Sit  4: Min assist;With upper extremity assist;To bed    Stand Pivot Transfers  4: Min assist    Stand Pivot  Transfer Details (indicate cue type and reason)  with RW; cues for sequencing when pivoting RW and pivoting feet      Neuro Re-ed    Neuro Re-ed Details   in R sidelying performed pelvic PNF patterns into anterior elevation of pelvis <> posterior depression of pelvis adding in L hip flexion <> extension      Exercises   Exercises  Knee/Hip      Knee/Hip Exercises: Standing   Other Standing Knee Exercises  Terminal hip and knee extension with red theraband behind knee 1 set x 10 reps each side with pt holding RW.  Therapist providing cues for upright trunk and full hip extension with knee extension.  Also focused on maintaining hip and knee extension on LLE and stepping forwards and back with RLE with diagonal weight shifts.      Knee/Hip Exercises: Seated   Sit to Sand  3 sets;with UE support   focus on trunk elongation, fwd weight shift over bilat LE     Knee/Hip Exercises: Sidelying   Hip ABduction  Strengthening;Left;1 set;10 reps    Hip ABduction Limitations  open chain hip ABD with hip extension <> flexion.  5 reps, pelvic PNF and then 5 more reps with therapist providing facilitation at pelvis             PT Education - 01/01/19 1250    Education Details  will practice ambulation with daughter next session so they can perform at home    Person(s) Educated  Patient;Child(ren)    Methods  Explanation    Comprehension  Verbalized understanding       PT Short Term Goals - 01/01/19 1258      PT SHORT TERM GOAL #1   Title  Patient will be independent with initial HEP focusing on bilateral LE strength and balance to build upon gains made in therapy.    Time  4    Period  Weeks    Status  New    Target Date  01/20/19      PT SHORT TERM GOAL #2   Title  Patient will increase BERG score to at least a 20/56 in order to help decrease risk of future falls.    Baseline  11/56    Time  4    Period  Weeks    Status  New    Target Date  01/20/19      PT SHORT TERM GOAL #3    Title  Patient will perform sit <> stand with min A  from therapist and no UE support in order to help decrease caregiver burden at home during transfers.    Baseline  Mod A, requires UE support for balance stability    Time  4    Period  Weeks    Status  New    Target Date  01/20/19      PT SHORT TERM GOAL #4   Title  Patient will ambulate 100 ft with LRAD and CGA from therapist in order to improve household functional mobility.    Baseline  45 feet, mod A from therapist using RW    Time  4    Period  Weeks    Status  New    Target Date  01/20/19      PT SHORT TERM GOAL #5   Title  Patient will participate in further assessment of gait speed.    Baseline  .58 ft/sec    Time  4    Period  Weeks    Status  Achieved    Target Date  01/20/19      PT SHORT TERM GOAL #6   Title  Patient will perform all bed mobility with supervision in order to decrease caregiver burden and increase independence.    Time  4    Period  Weeks    Status  New    Target Date  01/20/19        PT Long Term Goals - 01/01/19 1259      PT LONG TERM GOAL #1   Title  Patient will be independent with final HEP in order to continue building upon functional gains made in therapy.  (DUE 02/19/19)    Time  8    Period  Weeks    Status  New      PT LONG TERM GOAL #2   Title  Patient will ambulate at least 150 feet with LRAD and supervision in order to improve functional mobility in the household and community.    Baseline  45 feet with RW, mod A from therapist    Time  8    Period  Weeks    Status  New      PT LONG TERM GOAL #3   Title  Patient will increase BERG score to at least 35/56 in order to decrease risk of future falls.    Baseline  11/56 on 12/21/18    Time  8    Period  Weeks      PT LONG TERM GOAL #4   Title  Patient will perform functional transfers and sit <> stands with mod I in order to decrease caregiver burden.    Baseline  Mod A with stand step transfers and sit <> stand from low  surface    Time  8    Period  Weeks    Status  New      PT LONG TERM GOAL #5   Title  Patient will increase gait speed to at least 1.8 ft/sec in order to decrease risk of falls and improve household ambulation.    Baseline  .67 ft/sec with RW    Time  8    Period  Weeks    Status  Revised      PT LONG TERM GOAL #6   Title  Patient will ascend/descend 3 steps using LRAD with supervision in order to enter/exit home without use of a ramp.    Time  8    Period  Weeks  Status  New            Plan - 01/01/19 1251    Clinical Impression Statement  Treatment session focused on assessment of gait velocity with RW and assessment of impairments limiting ambulation.  Transitioned to NMR and LLE strengthening in sidelying focusing on on pelvic PNF patterns combined with proximal hip strengthening.  In stsanding focused on LE extensor activation and carryover to pre-gait sequencing and training.  Pt tolerated well but did report fatigue at end of session.  Daughter expressed interest in ambulating with pt at home; will review next session.    Personal Factors and Comorbidities  Past/Current Experience;Time since onset of injury/illness/exacerbation;Comorbidity 3+    Comorbidities  Hypertension, Diabetes, Chronic kidney disease    Examination-Activity Limitations  Locomotion Level;Transfers;Stand;Squat    Examination-Participation Restrictions  Community Activity    Stability/Clinical Decision Making  Evolving/Moderate complexity    Rehab Potential  Good    PT Frequency  Other (comment)   3x week/4 weeks, 2x week/4 weeks   PT Duration  8 weeks    PT Treatment/Interventions  ADLs/Self Care Home Management;Gait training;DME Instruction;Stair training;Functional mobility training;Therapeutic activities;Patient/family education;Neuromuscular re-education;Balance training;Therapeutic exercise;Passive range of motion    PT Next Visit Plan  gait training with RW with daughter. continued to work on  static standing balance, LE strengthening and gait with RW.    Consulted and Agree with Plan of Care  Patient;Family member/caregiver    Family Member Consulted  Daughter, [name]       Patient will benefit from skilled therapeutic intervention in order to improve the following deficits and impairments:  Abnormal gait, Decreased activity tolerance, Decreased balance, Decreased range of motion, Decreased mobility, Decreased endurance, Decreased skin integrity, Decreased strength, Difficulty walking, Impaired perceived functional ability, Postural dysfunction  Visit Diagnosis: 1. Hemiplegia and hemiparesis following cerebral infarction affecting left non-dominant side ([place])   2. Muscle weakness (generalized)   3. Unsteadiness on feet   4. Other abnormalities of gait and mobility        Problem List Patient Active Problem List   Diagnosis Date Noted  . Lethargy 10/07/2018  . Pressure injury of skin 09/27/2018  . Pelvic fracture ([place]) 09/21/2018  . Allergic reaction caused by a drug 05/06/2018  . Bilateral leg edema 04/13/2018  . HLD (hyperlipidemia) 03/23/2018  . Mood swings 03/23/2018  . Chronic kidney disease (CKD), stage III (moderate) ([facility])   . Embolic stroke ([place]) 66/59/9357  . Left hemiparesis ([place])   . Diabetes mellitus type 2 in nonobese ([facility])   . Acute ischemic stroke ([place])   . Left arm weakness   . Acute CVA (cerebrovascular accident) ([place]) 02/24/2018  . 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 Overweight (BMI 25.0-29.9) 02/24/2018  . CKD (chronic kidney disease), stage III ([place]) 02/24/2018  . Diabetes mellitus without complication ([place]) 01/77/9390  . HTN (hypertension) 09/02/2017  . Knee pain, bilateral 09/02/2017    Rico Junker, PT, DPT 01/01/19    1:00 PM    [facility] [address] [place], [place], [address] Phone: [phone]   Fax:  [phone]  Name: [name] MRN: [id number] Date of Birth: 1946/06/25

## 2019-01-05 ENCOUNTER — Encounter: Payer: Self-pay | Admitting: Physical Therapy

## 2019-01-05 ENCOUNTER — Other Ambulatory Visit: Payer: Self-pay

## 2019-01-05 ENCOUNTER — Encounter: Payer: Self-pay | Admitting: Podiatry

## 2019-01-05 ENCOUNTER — Ambulatory Visit: Payer: Medicare Other | Admitting: Occupational Therapy

## 2019-01-05 ENCOUNTER — Ambulatory Visit: Payer: Medicare Other | Admitting: Physical Therapy

## 2019-01-05 DIAGNOSIS — M25612 Stiffness of left shoulder, not elsewhere classified: Secondary | ICD-10-CM

## 2019-01-05 DIAGNOSIS — M6281 Muscle weakness (generalized): Secondary | ICD-10-CM

## 2019-01-05 DIAGNOSIS — R2689 Other abnormalities of gait and mobility: Secondary | ICD-10-CM

## 2019-01-05 DIAGNOSIS — R2681 Unsteadiness on feet: Secondary | ICD-10-CM

## 2019-01-05 DIAGNOSIS — I69354 Hemiplegia and hemiparesis following cerebral infarction affecting left non-dominant side: Secondary | ICD-10-CM | POA: Diagnosis not present

## 2019-01-05 NOTE — Therapy (Signed)
[facility] [address] [place], [place], [address] Phone: [phone]   Fax:  [phone]  Occupational Therapy Treatment  Patient Details  Name: [name] MRN: [id number] Date of Birth: 19-Jul-1945 Referring Provider (OT): Dr. Letta Pate   Encounter Date: 01/05/2019  OT End of Session - 01/05/19 1556    Visit Number  3    Number of Visits  17    Date for OT Re-Evaluation  02/16/19    Authorization Type  [employer] MCR    Authorization - Visit Number  3    Authorization - Number of Visits  10    OT Start Time  1500    OT Stop Time  1545    OT Time Calculation (min)  45 min    Activity Tolerance  Patient tolerated treatment well    Behavior During Therapy  [facility] for tasks assessed/performed       Past Medical History:  Diagnosis Date  . Allergy   . Arthritis   . Cataract   . Diabetes mellitus without complication ([place])   . Hypertension   . Macular degeneration syndrome    with edema---being treated.     Past Surgical History:  Procedure Laterality Date  . callous removal  Left 2017   located on 1st digit on L foot 2/2 diabetes  . EYE SURGERY      There were no vitals filed for this visit.  Subjective Assessment - 01/05/19 1554    Patient is accompanied by:  Family member   daughter   Pertinent History  CVA 02/27/18, pelvic fx and Lt hip  fx 09/2018. PMH: HTN, OA    Limitations  Fall risk, wounds on feet    Currently in Pain?  Yes    Pain Score  3     Pain Location  Shoulder    Pain Orientation  Left    Pain Descriptors / Indicators  Sore;Aching    Pain Type  Chronic pain    Pain Onset  More than a month ago    Pain Frequency  Intermittent    Aggravating Factors   P/ROM    Pain Relieving Factors  Proper positioning, lower ranges, tylenol       CLINIC OPERATION CHANGES: Outpatient Neuro Rehab is open at lower capacity following universal masking, social distancing, and patient screening.  The  patient's COVID risk of complications score is 5.  Transfer w/c to mat with mod assist (pt more fatigued after P.T. session). Then seated to supine with min assist Supine: worked gentle [place] mobs and capsular stretch followed by P/ROM and AA/ROM in shoulder flexion, abduction, and ER as able. Progressed to self ROM stretches however w/ cues for proper positioning in neutral sh rotation as pt wants to go into abd and IR. BUE AA/ROM in chest press and shoulder flexion with thick foam and min assist/facilitation for LUE. Pt also encouraged to look at both arms d/t decreased sensation and inattention to Lt side to keep arms level and decrease compensations.  Seated: worked on low to mid level reaching w/ visualizations to help decr compensations into abd and IR. Also worked on posture (trunk ext and ant pelvic tilt) w/ arms wt bearing in abduction. Sit -stand with min assist working on wt bearing evenly through LE's prior to transfer to w/c                     OT Short Term Goals - 12/17/18 1779  OT SHORT TERM GOAL #1   Title  Independent with initial HEP for shoulder ROM, and functional reaching LUE - due 01/16/19    Time  4    Period  Weeks    Status  New      OT SHORT TERM GOAL #2   Title  Pt to perform UB dressing with set up only and LE dressing with mod assist or less using A/E prn    Time  4    Period  Weeks    Status  New      OT SHORT TERM GOAL #3   Title  Pt to increase LUE functional use as evidenced by performing 20 blocks on Box & Blocks test    Baseline  11 blocks    Time  4    Period  Weeks    Status  New      OT SHORT TERM GOAL #4   Title  Pt to perform toileting/clothes management with only min assist    Time  4    Period  Weeks    Status  New      OT SHORT TERM GOAL #5   Title  Pt to achieve 90* shoulder flexion LUE for mid level reaching    Time  4    Period  Weeks    Status  New        OT Long Term Goals - 12/17/18 8546      OT LONG TERM  GOAL #1   Title  Independent with updated HEP - due 02/16/19    Time  8    Period  Weeks    Status  New      OT LONG TERM GOAL #2   Title  Pt to be mod I with all BADLS using A/E as needed (except shower transfers to still be supervision)    Time  8    Period  Weeks    Status  New      OT LONG TERM GOAL #3   Title  Pt to improve LUE function as evidenced by performing 25 blocks or greater on Box & Blocks test    Time  8    Period  Weeks    Status  New      OT LONG TERM GOAL #4   Title  Pt to improve coordination as evidenced by performing 9 hole peg test completely in 90 sec or less    Baseline  placed 5 in 2 min.    Time  8    Period  Weeks    Status  New      OT LONG TERM GOAL #5   Title  Grip strength Lt hand to be 15 lbs or greater    Baseline  8 lbs    Time  8    Period  Weeks    Status  New      Long Term Additional Goals   Additional Long Term Goals  Yes      OT LONG TERM GOAL #6   Title  Pt to perform simple snack, cold food, and microwaveable food mod I level with DME prn without LOB    Time  8    Period  Weeks    Status  New      OT LONG TERM GOAL #7   Title  Pt to achieve 110* or greater P/ROM Lt shoulder with pain less than or equal to 5/10    Time  [place] - 01/05/19 1556    Clinical Impression Statement  Pt/daughter with greater understanding of posture and compensations and improves w/ demo and visual cues    Body Structure / Function / Physical Skills  ADL;ROM;Balance;IADL;Body mechanics;Improper spinal/pelvic alignment;Mobility;Strength;Flexibility;FMC;Coordination;UE functional use;Proprioception;Decreased knowledge of use of DME    Rehab Potential  Good    OT Frequency  2x / week    OT Duration  8 weeks    OT Treatment/Interventions  Self-care/ADL training;Therapeutic exercise;Functional Mobility Training;Neuromuscular education;Aquatic Therapy;Manual Therapy;Splinting;Therapeutic  activities;Paraffin;DME and/or AE instruction;Cognitive remediation/compensation;Electrical Stimulation;Visual/perceptual remediation/compensation;Moist Heat;Passive range of motion;Patient/family education    Plan  continue NMR (including posture, proper reaching pattern, and transitional movements), low to mid level reaching    Consulted and Agree with Plan of Care  Patient;Family member/caregiver       Patient will benefit from skilled therapeutic intervention in order to improve the following deficits and impairments:   Body Structure / Function / Physical Skills: ADL, ROM, Balance, IADL, Body mechanics, Improper spinal/pelvic alignment, Mobility, Strength, Flexibility, FMC, Coordination, UE functional use, Proprioception, Decreased knowledge of use of DME       Visit Diagnosis: 1. Hemiplegia and hemiparesis following cerebral infarction affecting left non-dominant side ([place])   2. Muscle weakness (generalized)   3. Unsteadiness on feet   4. Stiffness of left shoulder, not elsewhere classified       Problem List Patient Active Problem List   Diagnosis Date Noted  . Lethargy 10/07/2018  . Pressure injury of skin 09/27/2018  . Pelvic fracture ([place]) 09/21/2018  . Allergic reaction caused by a drug 05/06/2018  . Bilateral leg edema 04/13/2018  . HLD (hyperlipidemia) 03/23/2018  . Mood swings 03/23/2018  . Chronic kidney disease (CKD), stage III (moderate) ([facility])   . Embolic stroke ([place]) 16/04/9603  . Left hemiparesis ([place])   . Diabetes mellitus type 2 in nonobese ([facility])   . Acute ischemic stroke ([place])   . Left arm weakness   . Acute CVA (cerebrovascular accident) ([place]) 02/24/2018  . Macular edema due to secondary diabetes ([place]) 02/24/2018  . Overweight (BMI 25.0-29.9) 02/24/2018  . CKD (chronic kidney disease), stage III ([place]) 02/24/2018  . Diabetes mellitus without complication ([place]) 54/03/8118  . HTN (hypertension) 09/02/2017  . Knee pain, bilateral 09/02/2017    Carey Bullocks, OTR/L 01/05/2019, 3:59 PM  [facility] [address] [place], [place], [address] Phone: [phone]   Fax:  [phone]  Name: [name] MRN: [id number] Date of Birth: Oct 24, 1945

## 2019-01-05 NOTE — Therapy (Signed)
[facility] [address] [place], [place], [address] Phone: [phone]   Fax:  [phone]  Physical Therapy Treatment  Patient Details  Name: [name] MRN: [id number] Date of Birth: 1946/03/30 Referring Provider (PT): Alysia Penna, MD  CLINIC OPERATION CHANGES: [facility] is open at lower capacity following universal masking, social distancing, and patient screening.  The patient's COVID risk of complications score is 5.  Encounter Date: 01/05/2019  PT End of Session - 01/05/19 1531    Visit Number  4    Number of Visits  21    Date for PT Re-Evaluation  02/19/19    Authorization Type  [employer] Medicare, 10th visit progress note    PT Start Time  1404    PT Stop Time  1445    PT Time Calculation (min)  41 min    Activity Tolerance  Patient tolerated treatment well    Behavior During Therapy  WFL for tasks assessed/performed       Past Medical History:  Diagnosis Date  . Allergy   . Arthritis   . Cataract   . Diabetes mellitus without complication ([place])   . Hypertension   . Macular degeneration syndrome    with edema---being treated.     Past Surgical History:  Procedure Laterality Date  . callous removal  Left 2017   located on 1st digit on L foot 2/2 diabetes  . EYE SURGERY      There were no vitals filed for this visit.  Subjective Assessment - 01/05/19 1408    Subjective  Feels like botox has helped a little bit.  Denies falls or changes. Still doing transfers only at home. Daughter feels like her gait will progress as she strengthens and reports pt does not feel comfortable ambulating at home with her yet, even with instruction from therapy.    Patient is accompained by:  Family member   daughter   Pertinent History  Patient had a R CVA 02/2018 and was walking with a SPC prior to a fall in March 2020 resulting in being hospitalized with multiple pelvic fxs and a L hip fx. During  hospital stay, patient acquired pressure ulcers on bilateral soles of feet near heels.Patient is now safely able to bear weight and pressure ulcers are now a stage 1 (per daughter report and photos).    Patient Stated Goals  "Be back to normal, get back to walking, and getting strength back"    Currently in Pain?  No/denies        [facility] Adult PT Treatment/Exercise - 01/05/19 0001      Transfers   Transfers  Sit to Stand;Stand to Sit;Stand Pivot Transfers    Sit to Stand  4: Min assist    Sit to Stand Details  Tactile cues for weight shifting;Manual facilitation for weight shifting    Stand to Sit  4: Min guard    Stand to Sit Details (indicate cue type and reason)  Verbal cues for sequencing;Verbal cues for technique;Verbal cues for precautions/safety      Ambulation/Gait   Ambulation/Gait  Yes    Ambulation/Gait Assistance  4: Min assist    Ambulation Distance (Feet)  115 Feet   x 2, 90' x 1 and parallel bars 20', 40', 40'   Assistive device  Rolling walker;Small based quad cane    Gait Pattern  Step-to pattern;Step-through pattern;Decreased step length - right;Decreased stride length;Decreased step length - left;Decreased hip/knee flexion - left;Decreased dorsiflexion -  left;Decreased weight shift to right;Left flexed knee in stance;Right flexed knee in stance;Lateral hip instability;Trunk flexed;Poor foot clearance - left    Ambulation Surface  Level;Indoor    Gait Comments  Pt had difficulty with steering RW on first ambulation due to L UE weakness.  Tried another RW on second/third and improved but still needed min assist to guide RW at times, especially with turns.  Pt requiring verbal cues for step length on L and is able to correct but reverts back to decreased step length/foot clearance with turns or distraction.  Pt did not feel comfortable trying quad cane unless near support so ambulated with quad cane inside parallel bars but pt was able to do so without use of bars.       PT  Short Term Goals - 01/01/19 1258      PT SHORT TERM GOAL #1   Title  Patient will be independent with initial HEP focusing on bilateral LE strength and balance to build upon gains made in therapy.    Time  4    Period  Weeks    Status  New    Target Date  01/20/19      PT SHORT TERM GOAL #2   Title  Patient will increase BERG score to at least a 20/56 in order to help decrease risk of future falls.    Baseline  11/56    Time  4    Period  Weeks    Status  New    Target Date  01/20/19      PT SHORT TERM GOAL #3   Title  Patient will perform sit <> stand with min A from therapist and no UE support in order to help decrease caregiver burden at home during transfers.    Baseline  Mod A, requires UE support for balance stability    Time  4    Period  Weeks    Status  New    Target Date  01/20/19      PT SHORT TERM GOAL #4   Title  Patient will ambulate 100 ft with LRAD and CGA from therapist in order to improve household functional mobility.    Baseline  45 feet, mod A from therapist using RW    Time  4    Period  Weeks    Status  New    Target Date  01/20/19      PT SHORT TERM GOAL #5   Title  Patient will participate in further assessment of gait speed.    Baseline  .11 ft/sec    Time  4    Period  Weeks    Status  Achieved    Target Date  01/20/19      PT SHORT TERM GOAL #6   Title  Patient will perform all bed mobility with supervision in order to decrease caregiver burden and increase independence.    Time  4    Period  Weeks    Status  New    Target Date  01/20/19        PT Long Term Goals - 01/01/19 1259      PT LONG TERM GOAL #1   Title  Patient will be independent with final HEP in order to continue building upon functional gains made in therapy.  (DUE 02/19/19)    Time  8    Period  Weeks    Status  New      PT LONG TERM GOAL #2  Title  Patient will ambulate at least 150 feet with LRAD and supervision in order to improve functional mobility in the  household and community.    Baseline  45 feet with RW, mod A from therapist    Time  8    Period  Weeks    Status  New      PT LONG TERM GOAL #3   Title  Patient will increase BERG score to at least 35/56 in order to decrease risk of future falls.    Baseline  11/56 on 12/21/18    Time  8    Period  Weeks      PT LONG TERM GOAL #4   Title  Patient will perform functional transfers and sit <> stands with mod I in order to decrease caregiver burden.    Baseline  Mod A with stand step transfers and sit <> stand from low surface    Time  8    Period  Weeks    Status  New      PT LONG TERM GOAL #5   Title  Patient will increase gait speed to at least 1.8 ft/sec in order to decrease risk of falls and improve household ambulation.    Baseline  .62 ft/sec with RW    Time  8    Period  Weeks    Status  Revised      PT LONG TERM GOAL #6   Title  Patient will ascend/descend 3 steps using LRAD with supervision in order to enter/exit home without use of a ramp.    Time  8    Period  Weeks    Status  New            Plan - 01/05/19 1531    Clinical Impression Statement  Today's session focused on gait.  Pt's daughter did not feel like they needed family instruction on gait technique yet as pt is still fearful of falling and does not feel comfortable walking outside of PT sessions.  Pt tolerated increased ambulation and trial of SBQC well today and only needed short rest breaks in between.  Continue PT per POC.    Personal Factors and Comorbidities  Past/Current Experience;Time since onset of injury/illness/exacerbation;Comorbidity 3+    Comorbidities  Hypertension, Diabetes, Chronic kidney disease    Examination-Activity Limitations  Locomotion Level;Transfers;Stand;Squat    Examination-Participation Restrictions  Community Activity    Stability/Clinical Decision Making  Evolving/Moderate complexity    Rehab Potential  Good    PT Frequency  Other (comment)   3x week/4 weeks, 2x week/4  weeks   PT Duration  8 weeks    PT Treatment/Interventions  ADLs/Self Care Home Management;Gait training;DME Instruction;Stair training;Functional mobility training;Therapeutic activities;Patient/family education;Neuromuscular re-education;Balance training;Therapeutic exercise;Passive range of motion    PT Next Visit Plan  Continue to work on gait with SBQC vs RW, static standing balance, LE strengthening.    Consulted and Agree with Plan of Care  Patient;Family member/caregiver    Family Member Consulted  Daughter, [name]       Patient will benefit from skilled therapeutic intervention in order to improve the following deficits and impairments:  Abnormal gait, Decreased activity tolerance, Decreased balance, Decreased range of motion, Decreased mobility, Decreased endurance, Decreased skin integrity, Decreased strength, Difficulty walking, Impaired perceived functional ability, Postural dysfunction  Visit Diagnosis: 1. Hemiplegia and hemiparesis following cerebral infarction affecting left non-dominant side ([place])   2. Muscle weakness (generalized)   3. Unsteadiness on feet   4.  Other abnormalities of gait and mobility        Problem List Patient Active Problem List   Diagnosis Date Noted  . Lethargy 10/07/2018  . Pressure injury of skin 09/27/2018  . Pelvic fracture ([place]) 09/21/2018  . Allergic reaction caused by a drug 05/06/2018  . Bilateral leg edema 04/13/2018  . HLD (hyperlipidemia) 03/23/2018  . Mood swings 03/23/2018  . Chronic kidney disease (CKD), stage III (moderate) ([facility])   . Embolic stroke ([place]) 67/59/1638  . Left hemiparesis ([place])   . Diabetes mellitus type 2 in nonobese ([facility])   . Acute ischemic stroke ([place])   . Left arm weakness   . Acute CVA (cerebrovascular accident) ([place]) 02/24/2018  . 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 Overweight (BMI 25.0-29.9) 02/24/2018  . CKD (chronic kidney disease), stage III ([place]) 02/24/2018  . Diabetes  mellitus without complication ([place]) 46/65/9935  . HTN (hypertension) 09/02/2017  . Knee pain, bilateral 09/02/2017    Narda Bonds, PTA [facility] 01/05/19 3:37 PM Phone: [phone] Fax: [facility] [address] [place], [place], [address] Phone: [phone]   Fax:  [phone]  Name: [name] MRN: [id number] Date of Birth: Mar 14, 1946

## 2019-01-06 ENCOUNTER — Ambulatory Visit: Payer: Medicare Other | Attending: Physical Medicine & Rehabilitation | Admitting: Physical Therapy

## 2019-01-06 DIAGNOSIS — R2681 Unsteadiness on feet: Secondary | ICD-10-CM | POA: Diagnosis present

## 2019-01-06 DIAGNOSIS — R278 Other lack of coordination: Secondary | ICD-10-CM | POA: Diagnosis present

## 2019-01-06 DIAGNOSIS — M25612 Stiffness of left shoulder, not elsewhere classified: Secondary | ICD-10-CM | POA: Diagnosis present

## 2019-01-06 DIAGNOSIS — M6281 Muscle weakness (generalized): Secondary | ICD-10-CM | POA: Diagnosis present

## 2019-01-06 DIAGNOSIS — G8929 Other chronic pain: Secondary | ICD-10-CM | POA: Insufficient documentation

## 2019-01-06 DIAGNOSIS — R2689 Other abnormalities of gait and mobility: Secondary | ICD-10-CM

## 2019-01-06 DIAGNOSIS — M25512 Pain in left shoulder: Secondary | ICD-10-CM | POA: Diagnosis present

## 2019-01-06 DIAGNOSIS — I69354 Hemiplegia and hemiparesis following cerebral infarction affecting left non-dominant side: Secondary | ICD-10-CM | POA: Diagnosis present

## 2019-01-06 NOTE — Therapy (Signed)
[facility] [address] [place], [place], [address] Phone: [phone]   Fax:  [phone]  Physical Therapy Treatment  Patient Details  Name: [name] MRN: [id number] Date of Birth: [birth year] Referring Provider (PT): Alysia Penna, MD  CLINIC OPERATION CHANGES: [facility] is open at lower capacity following universal masking, social distancing, and patient screening.  The patient's COVID risk of complications score is 5.   Encounter Date: 01/06/2019  PT End of Session - 01/06/19 0954    Visit Number  5    Number of Visits  21    Date for PT Re-Evaluation  02/19/19    Authorization Type  [employer] Medicare, 10th visit progress note    PT Start Time  0900    PT Stop Time  0943    PT Time Calculation (min)  43 min    Activity Tolerance  Patient tolerated treatment well;Patient limited by fatigue    Behavior During Therapy  [facility] for tasks assessed/performed       Past Medical History:  Diagnosis Date  . Allergy   . Arthritis   . Cataract   . Diabetes mellitus without complication ([place])   . Hypertension   . Macular degeneration syndrome    with edema---being treated.     Past Surgical History:  Procedure Laterality Date  . callous removal  Left 2017   located on 1st digit on L foot 2/2 diabetes  . EYE SURGERY      There were no vitals filed for this visit.  Subjective Assessment - 01/06/19 0859    Subjective  Feels tired from having back to back appointments yesterday. Wearing new shoes today that feel more supportive.    Patient is accompained by:  Family member   daughter   Pertinent History  Patient had a R CVA 02/2018 and was walking with a SPC prior to a fall in March 2020 resulting in being hospitalized with multiple pelvic fxs and a L hip fx. During hospital stay, patient acquired pressure ulcers on bilateral soles of feet near heels.Patient is now safely able to bear weight and  pressure ulcers are now a stage 1 (per daughter report and photos).    Patient Stated Goals  "Be back to normal, get back to walking, and getting strength back"    Currently in Pain?  No/denies                       [facility] Adult PT Treatment/Exercise - 01/06/19 0955      Transfers   Transfers  Sit to Stand;Stand to Sit    Sit to Stand  4: Min assist    Sit to Stand Details  Verbal cues for technique;Verbal cues for sequencing;Verbal cues for precautions/safety    Sit to Stand Details (indicate cue type and reason)  Cues for hand placement and to shift COG anteriorly, 5x using RW, 3x using SBQC    Stand to Sit  4: Min guard    Stand to Sit Details (indicate cue type and reason)  Verbal cues for technique;Verbal cues for sequencing   Slow descent, RUE placement   Stand Pivot Transfers  4: Min assist    Stand Pivot Transfer Details (indicate cue type and reason)  with quad cane, verbal cueing needed for step placement and keeping cane close to body      Ambulation/Gait   Ambulation/Gait  Yes    Ambulation/Gait Assistance  4: Min assist  Ambulation/Gait Assistance Details  Patient performed 2 bouts of ambulation of 35 feet, with w/c follow for safety. Seated rest break in between due to increased fatigue. Cueing needed for upright posture, larger step length, and keeping RW close by. As patient fatigues, pt has difficulty clearing L LE and demonstrates a step to pattern vs step through. Has difficulty holding trunk erect throughout gait cycle. Manual facilitation for weight shifting towards R to assist in clearing LLE during swing phase.       Ambulation Distance (Feet)  70 Feet   approx., 2 bouts of 35 ft   Assistive device  Rolling walker    Gait Pattern  Step-through pattern;Step-to pattern;Decreased step length - right;Decreased stance time - left;Decreased dorsiflexion - left;Trunk flexed    Ambulation Surface  Level;Indoor    Pre-Gait Activities  Standing with RW and min  guard, weight shifting towards R and stepping LLE to touch orange disc x10 reps, added holding LLE in dorsiflexion x2 seconds before placing on disk and stepping back x10 reps. Intermittent rest breaks.       Balance   Balance Assessed  Yes      Dynamic Standing Balance   Forward lean/weight shifting comments:  Standing with RW and min guard; x10 reps weight shifting towards RLE and reaching overhead to grab cone and place behind pt on mat table to promote erect posture and standing tolerance. x10 reps standing with LUE and L foot slightly in front of R, practicing on diagnonal weight shifting and leading with L ASIS. Pt initially had difficulty with the correct technique and would fowardly flex trunk and lead with L shoulder to weight shift. Patient able to maintain correct technique towards end of repeitions; verbal and tactile cue at L ASIS provided. Brief intermittent seated rest breaks.               PT Education - 01/06/19 [phone]    Education Details  importance of working on the different parts of walking, weight shifting to L, upright posture at home (sitting up for at least 5 minutes while eating)    Person(s) Educated  Patient;Child(ren)    Methods  Explanation    Comprehension  Verbalized understanding       PT Short Term Goals - 01/01/19 1258      PT SHORT TERM GOAL #1   Title  Patient will be independent with initial HEP focusing on bilateral LE strength and balance to build upon gains made in therapy.    Time  4    Period  Weeks    Status  New    Target Date  01/20/19      PT SHORT TERM GOAL #2   Title  Patient will increase BERG score to at least a 20/56 in order to help decrease risk of future falls.    Baseline  11/56    Time  4    Period  Weeks    Status  New    Target Date  01/20/19      PT SHORT TERM GOAL #3   Title  Patient will perform sit <> stand with min A from therapist and no UE support in order to help decrease caregiver burden at home during  transfers.    Baseline  Mod A, requires UE support for balance stability    Time  4    Period  Weeks    Status  New    Target Date  01/20/19  PT SHORT TERM GOAL #4   Title  Patient will ambulate 100 ft with LRAD and CGA from therapist in order to improve household functional mobility.    Baseline  45 feet, mod A from therapist using RW    Time  4    Period  Weeks    Status  New    Target Date  01/20/19      PT SHORT TERM GOAL #5   Title  Patient will participate in further assessment of gait speed.    Baseline  .53 ft/sec    Time  4    Period  Weeks    Status  Achieved    Target Date  01/20/19      PT SHORT TERM GOAL #6   Title  Patient will perform all bed mobility with supervision in order to decrease caregiver burden and increase independence.    Time  4    Period  Weeks    Status  New    Target Date  01/20/19        PT Long Term Goals - 01/01/19 1259      PT LONG TERM GOAL #1   Title  Patient will be independent with final HEP in order to continue building upon functional gains made in therapy.  (DUE 02/19/19)    Time  8    Period  Weeks    Status  New      PT LONG TERM GOAL #2   Title  Patient will ambulate at least 150 feet with LRAD and supervision in order to improve functional mobility in the household and community.    Baseline  45 feet with RW, mod A from therapist    Time  8    Period  Weeks    Status  New      PT LONG TERM GOAL #3   Title  Patient will increase BERG score to at least 35/56 in order to decrease risk of future falls.    Baseline  11/56 on 12/21/18    Time  8    Period  Weeks      PT LONG TERM GOAL #4   Title  Patient will perform functional transfers and sit <> stands with mod I in order to decrease caregiver burden.    Baseline  Mod A with stand step transfers and sit <> stand from low surface    Time  8    Period  Weeks    Status  New      PT LONG TERM GOAL #5   Title  Patient will increase gait speed to at least 1.8 ft/sec  in order to decrease risk of falls and improve household ambulation.    Baseline  .52 ft/sec with RW    Time  8    Period  Weeks    Status  Revised      PT LONG TERM GOAL #6   Title  Patient will ascend/descend 3 steps using LRAD with supervision in order to enter/exit home without use of a ramp.    Time  8    Period  Weeks    Status  New            Plan - 01/06/19 1120    Clinical Impression Statement  Today's session focused on standing upright tolerance and lateral weight shifting using RW in order to help normalize gait mechanics. Patient had increased fatigue today due to 2  back to back sessions yesterday and  required frequent seated rest breaks during gait and weight bearing exercises. Patient has difficulty keeping trunk erect during gait/standing exercises and exhibits a step to pattern and increased difficulty clearing LLE with toe scuffing when fatigued while ambulating. Will continue to progress in order to reach LTGs.    Personal Factors and Comorbidities  Past/Current Experience;Time since onset of injury/illness/exacerbation;Comorbidity 3+    Comorbidities  Hypertension, Diabetes, Chronic kidney disease    Examination-Activity Limitations  Locomotion Level;Transfers;Stand;Squat    Examination-Participation Restrictions  Community Activity    Stability/Clinical Decision Making  Evolving/Moderate complexity    Rehab Potential  Good    PT Frequency  Other (comment)   3x week/4 weeks, 2x week/4 weeks   PT Duration  8 weeks    PT Treatment/Interventions  ADLs/Self Care Home Management;Gait training;DME Instruction;Stair training;Functional mobility training;Therapeutic activities;Patient/family education;Neuromuscular re-education;Balance training;Therapeutic exercise;Passive range of motion    PT Next Visit Plan  Check hip flexor flexibility. Continue to work on gait with SBQC vs RW, static standing balance, lateral/diagnonal weight shifting, postural strengthening/overhead  activies on R,  LE strengthening.    Consulted and Agree with Plan of Care  Patient;Family member/caregiver    Family Member Consulted  Daughter, [name]       Patient will benefit from skilled therapeutic intervention in order to improve the following deficits and impairments:  Abnormal gait, Decreased activity tolerance, Decreased balance, Decreased range of motion, Decreased mobility, Decreased endurance, Decreased skin integrity, Decreased strength, Difficulty walking, Impaired perceived functional ability, Postural dysfunction  Visit Diagnosis: 1. Hemiplegia and hemiparesis following cerebral infarction affecting left non-dominant side ([place])   2. Muscle weakness (generalized)   3. Unsteadiness on feet   4. Other abnormalities of gait and mobility        Problem List Patient Active Problem List   Diagnosis Date Noted  . Lethargy 10/07/2018  . Pressure injury of skin 09/27/2018  . Pelvic fracture ([place]) 09/21/2018  . Allergic reaction caused by a drug 05/06/2018  . Bilateral leg edema 04/13/2018  . HLD (hyperlipidemia) 03/23/2018  . Mood swings 03/23/2018  . Chronic kidney disease (CKD), stage III (moderate) ([facility])   . Embolic stroke ([place]) 51/88/4166  . Left hemiparesis ([place])   . Diabetes mellitus type 2 in nonobese ([facility])   . Acute ischemic stroke ([place])   . Left arm weakness   . Acute CVA (cerebrovascular accident) ([place]) 02/24/2018  . Macular edema due to secondary diabetes ([place]) 02/24/2018  . Overweight (BMI 25.0-29.9) 02/24/2018  . CKD (chronic kidney disease), stage III ([place]) 02/24/2018  . Diabetes mellitus without complication ([place]) 01/05/1600  . HTN (hypertension) 09/02/2017  . Knee pain, bilateral 09/02/2017    Arliss Journey, PT, DPT 01/06/2019, 11:27 AM  [facility] [address] [place], [place], [address] Phone: [phone]   Fax:  [phone]  Name: ZYAN MIRKIN MRN: [id number] Date of  Birth: Jul 04, 1946

## 2019-01-07 ENCOUNTER — Ambulatory Visit: Payer: Medicare Other | Admitting: Physical Therapy

## 2019-01-07 ENCOUNTER — Encounter: Payer: Medicare Other | Admitting: Occupational Therapy

## 2019-01-11 ENCOUNTER — Encounter: Payer: Self-pay | Admitting: Family Medicine

## 2019-01-12 ENCOUNTER — Encounter

## 2019-01-12 ENCOUNTER — Ambulatory Visit: Payer: Medicare Other | Admitting: Family Medicine

## 2019-01-13 ENCOUNTER — Encounter: Payer: Self-pay | Admitting: Family Medicine

## 2019-01-13 ENCOUNTER — Ambulatory Visit: Payer: Medicare Other | Admitting: Occupational Therapy

## 2019-01-13 ENCOUNTER — Other Ambulatory Visit: Payer: Self-pay

## 2019-01-13 ENCOUNTER — Ambulatory Visit: Payer: Medicare Other | Admitting: Physical Therapy

## 2019-01-13 DIAGNOSIS — R2689 Other abnormalities of gait and mobility: Secondary | ICD-10-CM

## 2019-01-13 DIAGNOSIS — I69354 Hemiplegia and hemiparesis following cerebral infarction affecting left non-dominant side: Secondary | ICD-10-CM | POA: Diagnosis not present

## 2019-01-13 DIAGNOSIS — M6281 Muscle weakness (generalized): Secondary | ICD-10-CM

## 2019-01-13 DIAGNOSIS — R278 Other lack of coordination: Secondary | ICD-10-CM

## 2019-01-13 DIAGNOSIS — R2681 Unsteadiness on feet: Secondary | ICD-10-CM

## 2019-01-13 NOTE — Therapy (Signed)
[facility] [address] [place], [place], [address] Phone: [phone]   Fax:  [phone]  Physical Therapy Treatment  Patient Details  Name: [name] MRN: [id number] Date of Birth: [birth year] Referring Provider (PT): Alysia Penna, MD  CLINIC OPERATION CHANGES: Outpatient [facility] is open at lower capacity following universal masking, social distancing, and patient screening.  The patient's COVID risk of complications score is 5.  Encounter Date: 01/13/2019  PT End of Session - 01/13/19 0814    Visit Number  6    Number of Visits  21    Date for PT Re-Evaluation  02/19/19    Authorization Type  [employer] Medicare, 10th visit progress note    PT Start Time  0700    PT Stop Time  0748    PT Time Calculation (min)  48 min    Activity Tolerance  Patient tolerated treatment well;Patient limited by fatigue    Behavior During Therapy  [facility] for tasks assessed/performed       Past Medical History:  Diagnosis Date  . Allergy   . Arthritis   . Cataract   . Diabetes mellitus without complication ([place])   . Hypertension   . Macular degeneration syndrome    with edema---being treated.     Past Surgical History:  Procedure Laterality Date  . callous removal  Left 2017   located on 1st digit on L foot 2/2 diabetes  . EYE SURGERY      There were no vitals filed for this visit.  Subjective Assessment - 01/13/19 0703    Subjective  Things are going pretty good. Did not sleep well last night. Has been having numbness/burning/tingling in legs (L>R).    Patient is accompained by:  Family member   daughter   Pertinent History  Patient had a R CVA 02/2018 and was walking with a SPC prior to a fall in March 2020 resulting in being hospitalized with multiple pelvic fxs and a L hip fx. During hospital stay, patient acquired pressure ulcers on bilateral soles of feet near heels.Patient is now safely able to bear weight and  pressure ulcers are now a stage 1 (per daughter report and photos).    Patient Stated Goals  "Be back to normal, get back to walking, and getting strength back"                       [facility] Adult PT Treatment/Exercise - 01/13/19 0757      Bed Mobility   Bed Mobility  Supine to Sit;Sit to Supine    Supine to Sit  Moderate Assistance - Patient 50-74%   for trunk support   Sit to Supine  Minimal Assistance - Patient > 75%   to help advance LLE onto bed   Sit to Supine - Details (indicate cue type and reason)   when performing supine > sit cues needed for rolling towards L and using R UE to help push up to sitting       Transfers   Transfers  Sit to Stand;Stand to Sit;Stand Pivot Transfers    Sit to Stand  4: Min guard;4: Min assist    Sit to Stand Details  Verbal cues for safe use of DME/AE;Manual facilitation for weight shifting;Verbal cues for technique    Sit to Stand Details (indicate cue type and reason)  Cues for hand placement and to shift weight anteriorly. x7 reps while seated on wedge to facilitate anterior  pelvic tilt     Stand to Sit  4: Min guard;5: Supervision    Stand to Sit Details (indicate cue type and reason)  Verbal cues for sequencing;Verbal cues for technique;Manual facilitation for weight shifting    Stand to Sit Details  Cueing for slow descent    Stand Pivot Transfers  4: Min guard    Stand Pivot Transfer Details (indicate cue type and reason)  with quad cane, cues for posture and foot advancement      Ambulation/Gait   Ambulation/Gait  Yes    Ambulation/Gait Assistance  4: Min assist    Ambulation/Gait Assistance Details  Taught daughter, [name] how to safely ambulate with patient at home (L shoulder support, hands at pt's hips to help facilitate weight shifting). Patient initially walks with a step to pattern, can correct to step through with cues and weight shifting, but reverts back to a step to pattern when fatigued.     Ambulation Distance (Feet)   45 Feet    Assistive device  Rolling walker    Gait Pattern  Step-through pattern;Step-to pattern;Decreased step length - right;Decreased stance time - left;Decreased dorsiflexion - left;Trunk flexed    Ambulation Surface  Level;Indoor    Pre-Gait Activities  3 x 4 reps static standing x1 minute, no UE assist, min A from therapist for weight shifting towards L. Cues for upright posture      Exercises   Exercises  Shoulder      Knee/Hip Exercises: Stretches   Hip Flexor Stretch  Left;3 reps;30 seconds    Hip Flexor Stretch Limitations  Supine LLE off mat table, 3 x 30 seconds, w/ therapist supporting leg. PT taught daughter how to perform at home bilaterally      Knee/Hip Exercises: Supine   Bridges  5 reps;Strengthening;4 sets    Bridges Limitations  with focus on lifting L ASIS up first to activate L glutes vs. R glutes. able to perform with tactile and verbal cueing. instructed patient to hold at top for 2-3 seconds before slowly lowering      Shoulder Exercises: Stretch   Other Shoulder Stretches  Prior to gait with RW, pt performed x5 reps of seated bilateral pec stretches by placing arms in ER and in extension with hands placed on table in order to decrease shoulder pain before ambulating with RW.               PT Education - 01/13/19 0813    Education Details  How to perform supine hip flexor stretch and home bilaterally, gait training with RW for daughter to safely perform at home with pt    Person(s) Educated  Patient;Child(ren)    Methods  Explanation;Demonstration    Comprehension  Verbalized understanding;Returned demonstration;Need further instruction       PT Short Term Goals - 01/01/19 1258      PT SHORT TERM GOAL #1   Title  Patient will be independent with initial HEP focusing on bilateral LE strength and balance to build upon gains made in therapy.    Time  4    Period  Weeks    Status  New    Target Date  01/20/19      PT SHORT TERM GOAL #2   Title   Patient will increase BERG score to at least a 20/56 in order to help decrease risk of future falls.    Baseline  11/56    Time  4    Period  Weeks  Status  New    Target Date  01/20/19      PT SHORT TERM GOAL #3   Title  Patient will perform sit <> stand with min A from therapist and no UE support in order to help decrease caregiver burden at home during transfers.    Baseline  Mod A, requires UE support for balance stability    Time  4    Period  Weeks    Status  New    Target Date  01/20/19      PT SHORT TERM GOAL #4   Title  Patient will ambulate 100 ft with LRAD and CGA from therapist in order to improve household functional mobility.    Baseline  45 feet, mod A from therapist using RW    Time  4    Period  Weeks    Status  New    Target Date  01/20/19      PT SHORT TERM GOAL #5   Title  Patient will participate in further assessment of gait speed.    Baseline  .22 ft/sec    Time  4    Period  Weeks    Status  Achieved    Target Date  01/20/19      PT SHORT TERM GOAL #6   Title  Patient will perform all bed mobility with supervision in order to decrease caregiver burden and increase independence.    Time  4    Period  Weeks    Status  New    Target Date  01/20/19        PT Long Term Goals - 01/01/19 1259      PT LONG TERM GOAL #1   Title  Patient will be independent with final HEP in order to continue building upon functional gains made in therapy.  (DUE 02/19/19)    Time  8    Period  Weeks    Status  New      PT LONG TERM GOAL #2   Title  Patient will ambulate at least 150 feet with LRAD and supervision in order to improve functional mobility in the household and community.    Baseline  45 feet with RW, mod A from therapist    Time  8    Period  Weeks    Status  New      PT LONG TERM GOAL #3   Title  Patient will increase BERG score to at least 35/56 in order to decrease risk of future falls.    Baseline  11/56 on 12/21/18    Time  8    Period  Weeks       PT LONG TERM GOAL #4   Title  Patient will perform functional transfers and sit <> stands with mod I in order to decrease caregiver burden.    Baseline  Mod A with stand step transfers and sit <> stand from low surface    Time  8    Period  Weeks    Status  New      PT LONG TERM GOAL #5   Title  Patient will increase gait speed to at least 1.8 ft/sec in order to decrease risk of falls and improve household ambulation.    Baseline  .48 ft/sec with RW    Time  8    Period  Weeks    Status  Revised      PT LONG TERM GOAL #6   Title  Patient  will ascend/descend 3 steps using LRAD with supervision in order to enter/exit home without use of a ramp.    Time  8    Period  Weeks    Status  New            Plan - 01/13/19 0817    Clinical Impression Statement  Today's session focused on bed mobility, hip flexor stretching, and sit <> stands for functional LE strength. Patient is improving in static standing balance and is able to stand without UE support with min guard for approx. 1 minute, and is able to weight shift more to L with verbal cueing.  Prior to gait, pt reported increased L shoulder pain that decreased with performing stretches for HEP from OT. Began to teach pt's daughter how to safely ambulate with patient at home using RW, will continue training at next visit. Will continue to progress in order to reach LTGs.    Personal Factors and Comorbidities  Past/Current Experience;Time since onset of injury/illness/exacerbation;Comorbidity 3+    Comorbidities  Hypertension, Diabetes, Chronic kidney disease    Examination-Activity Limitations  Locomotion Level;Transfers;Stand;Squat    Examination-Participation Restrictions  Community Activity    Stability/Clinical Decision Making  Evolving/Moderate complexity    Rehab Potential  Good    PT Frequency  Other (comment)   3x week/4 weeks, 2x week/4 weeks   PT Duration  8 weeks    PT Treatment/Interventions  ADLs/Self Care Home  Management;Gait training;DME Instruction;Stair training;Functional mobility training;Therapeutic activities;Patient/family education;Neuromuscular re-education;Balance training;Therapeutic exercise;Passive range of motion    PT Next Visit Plan  trial rollator; Gait training with RW w/ [name]. bed mobility, static standing balance, lateral/diagnonal weight shifting, postural strengthening/overhead activies on R, LE strengthening (sit <> stands w/ focus on anterior pelvic tilt).Geanie Cooley and Agree with Plan of Care  Patient;Family member/caregiver    Family Member Consulted  Daughter, [name]       Patient will benefit from skilled therapeutic intervention in order to improve the following deficits and impairments:  Abnormal gait, Decreased activity tolerance, Decreased balance, Decreased range of motion, Decreased mobility, Decreased endurance, Decreased skin integrity, Decreased strength, Difficulty walking, Impaired perceived functional ability, Postural dysfunction  Visit Diagnosis: 1. Hemiplegia and hemiparesis following cerebral infarction affecting left non-dominant side ([place])   2. Muscle weakness (generalized)   3. Unsteadiness on feet   4. Other abnormalities of gait and mobility   5. Other lack of coordination        Problem List Patient Active Problem List   Diagnosis Date Noted  . Lethargy 10/07/2018  . Pressure injury of skin 09/27/2018  . Pelvic fracture ([place]) 09/21/2018  . Allergic reaction caused by a drug 05/06/2018  . Bilateral leg edema 04/13/2018  . HLD (hyperlipidemia) 03/23/2018  . Mood swings 03/23/2018  . Chronic kidney disease (CKD), stage III (moderate) (HCC)   . Embolic stroke ([place]) 87/86/7672  . Left hemiparesis ([place])   . Diabetes mellitus type 2 in nonobese (HCC)   . Acute ischemic stroke ([place])   . Left arm weakness   . Acute CVA (cerebrovascular accident) ([place]) 02/24/2018  . 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  Overweight (BMI 25.0-29.9) 02/24/2018  . CKD (chronic kidney disease), stage III ([place]) 02/24/2018  . Diabetes mellitus without complication ([place]) 09/47/0962  . HTN (hypertension) 09/02/2017  . Knee pain, bilateral 09/02/2017    Arliss Journey, PT, DPT 01/13/2019, 8:22 AM  [facility] [address]  [place], [place], [address] Phone: [phone]   Fax:  [phone]  Name: [name] MRN: [id number] Date of Birth: 1946/07/06

## 2019-01-14 ENCOUNTER — Ambulatory Visit: Payer: Medicare Other | Admitting: Occupational Therapy

## 2019-01-14 ENCOUNTER — Ambulatory Visit: Payer: Medicare Other | Admitting: Physical Therapy

## 2019-01-14 ENCOUNTER — Encounter: Payer: Self-pay | Admitting: Physical Therapy

## 2019-01-14 DIAGNOSIS — R2681 Unsteadiness on feet: Secondary | ICD-10-CM

## 2019-01-14 DIAGNOSIS — I69354 Hemiplegia and hemiparesis following cerebral infarction affecting left non-dominant side: Secondary | ICD-10-CM

## 2019-01-14 DIAGNOSIS — R2689 Other abnormalities of gait and mobility: Secondary | ICD-10-CM

## 2019-01-14 DIAGNOSIS — M6281 Muscle weakness (generalized): Secondary | ICD-10-CM

## 2019-01-14 NOTE — Therapy (Signed)
[facility] [address] [place], [place], [address] Phone: [phone]   Fax:  [phone]  Physical Therapy Treatment  Patient Details  Name: [name] MRN: [id number] Date of Birth: 01-22-1946 Referring Provider (PT): Alysia Penna, MD   Encounter Date: 01/14/2019   CLINIC OPERATION CHANGES: Outpatient Neuro Rehab is open at lower capacity following universal masking, social distancing, and patient screening.  The patient's COVID risk of complications score is 5.   PT End of Session - 01/14/19 0856    Visit Number  7    Number of Visits  21    Date for PT Re-Evaluation  02/19/19    Authorization Type  [employer] Medicare, 10th visit progress note    PT Start Time  0805    PT Stop Time  0849    PT Time Calculation (min)  44 min    Activity Tolerance  Patient tolerated treatment well;Patient limited by fatigue    Behavior During Therapy  [facility] for tasks assessed/performed       Past Medical History:  Diagnosis Date  . Allergy   . Arthritis   . Cataract   . Diabetes mellitus without complication ([place])   . Hypertension   . Macular degeneration syndrome    with edema---being treated.     Past Surgical History:  Procedure Laterality Date  . callous removal  Left 2017   located on 1st digit on L foot 2/2 diabetes  . EYE SURGERY      There were no vitals filed for this visit.  Subjective Assessment - 01/14/19 0806    Subjective  Felt okay after session yesterday; pt was up on her own last night for toileting.  Feet were bothering her last night, but feel better today.    Patient is accompained by:  Family member   daughter   Pertinent History  Patient had a R CVA 02/2018 and was walking with a SPC prior to a fall in March 2020 resulting in being hospitalized with multiple pelvic fxs and a L hip fx. During hospital stay, patient acquired pressure ulcers on bilateral soles of feet near heels.Patient is now  safely able to bear weight and pressure ulcers are now a stage 1 (per daughter report and photos).    Patient Stated Goals  "Be back to normal, get back to walking, and getting strength back"    Currently in Pain?  No/denies                       [facility] Adult PT Treatment/Exercise - 01/14/19 0837      Transfers   Transfers  Sit to Stand;Stand to Sit;Stand Pivot Transfers    Sit to Stand  4: Min assist    Sit to Stand Details (indicate cue type and reason)  practiced sit <> stand from rollator.  Cues required to sequence pushing rollator against mat to prevent it from sliding, how to manage brakes and hand placement for sit <>stand.  Min A required for full forward weight shift over BOS    Stand to Sit  4: Min assist    Stand Pivot Transfers  4: Min assist    Stand Pivot Transfer Details (indicate cue type and reason)  with UE support on rollator; min A for balance, weight shifting and verbal cues for full step length bilaterally      Ambulation/Gait   Ambulation/Gait  Yes    Ambulation/Gait Assistance  4: Min assist;3:  Mod assist    Ambulation/Gait Assistance Details  performed gait assessment over level, indoor surfaces with rollator to assess safety.  Pt required cues from therapist to maintain upright posture and safe distance to rollator, cues for full weight shift to R side and for full step length with LLE.    Ambulation Distance (Feet)  115 Feet    Assistive device  Rollator    Gait Pattern  Step-to pattern;Step-through pattern;Decreased step length - left;Decreased step length - right;Decreased stride length;Decreased hip/knee flexion - left;Decreased dorsiflexion - left;Right foot flat;Left foot flat;Trunk flexed;Poor foot clearance - left    Ambulation Surface  Level;Indoor    Gait Comments  Transitioned to gait training with rollator around household obstacles - had pt weave L and R around larger and smaller obstacles and across compliant surface.  When returning back  to the mat pt began to fatigue and experienced increased trunk flexion, hip and knee flexion in stance and required mod A to maintain upright trunk and weight shifting in order to take a full step with each foot.  When pivoting back to the mat pt unable to motor plan pivoting with LLE and started to become very anxous about falling; required mod-max A to complete pivot and return to sitting.  After seated rest break pt able to return to full standing.      Knee/Hip Exercises: Standing   Forward Step Up  Right;Both;10 reps;Hand Hold: 2;Step Height: 2"    Forward Step Up Limitations  2" step overs x 10 reps each side with bilat UE support.  One LE performing extension on step while contralateral LE performing stepping forwards and then backwards to simulate gait cycle             PT Education - 01/14/19 0856    Education Details  gait training and safety with rollator    Person(s) Educated  Patient;Child(ren)    Methods  Explanation;Demonstration    Comprehension  Need further instruction       PT Short Term Goals - 01/01/19 1258      PT SHORT TERM GOAL #1   Title  Patient will be independent with initial HEP focusing on bilateral LE strength and balance to build upon gains made in therapy.    Time  4    Period  Weeks    Status  New    Target Date  01/20/19      PT SHORT TERM GOAL #2   Title  Patient will increase BERG score to at least a 20/56 in order to help decrease risk of future falls.    Baseline  11/56    Time  4    Period  Weeks    Status  New    Target Date  01/20/19      PT SHORT TERM GOAL #3   Title  Patient will perform sit <> stand with min A from therapist and no UE support in order to help decrease caregiver burden at home during transfers.    Baseline  Mod A, requires UE support for balance stability    Time  4    Period  Weeks    Status  New    Target Date  01/20/19      PT SHORT TERM GOAL #4   Title  Patient will ambulate 100 ft with LRAD and CGA from  therapist in order to improve household functional mobility.    Baseline  45 feet, mod A from therapist using RW  Time  4    Period  Weeks    Status  New    Target Date  01/20/19      PT SHORT TERM GOAL #5   Title  Patient will participate in further assessment of gait speed.    Baseline  .66 ft/sec    Time  4    Period  Weeks    Status  Achieved    Target Date  01/20/19      PT SHORT TERM GOAL #6   Title  Patient will perform all bed mobility with supervision in order to decrease caregiver burden and increase independence.    Time  4    Period  Weeks    Status  New    Target Date  01/20/19        PT Long Term Goals - 01/01/19 1259      PT LONG TERM GOAL #1   Title  Patient will be independent with final HEP in order to continue building upon functional gains made in therapy.  (DUE 02/19/19)    Time  8    Period  Weeks    Status  New      PT LONG TERM GOAL #2   Title  Patient will ambulate at least 150 feet with LRAD and supervision in order to improve functional mobility in the household and community.    Baseline  45 feet with RW, mod A from therapist    Time  8    Period  Weeks    Status  New      PT LONG TERM GOAL #3   Title  Patient will increase BERG score to at least 35/56 in order to decrease risk of future falls.    Baseline  11/56 on 12/21/18    Time  8    Period  Weeks      PT LONG TERM GOAL #4   Title  Patient will perform functional transfers and sit <> stands with mod I in order to decrease caregiver burden.    Baseline  Mod A with stand step transfers and sit <> stand from low surface    Time  8    Period  Weeks    Status  New      PT LONG TERM GOAL #5   Title  Patient will increase gait speed to at least 1.8 ft/sec in order to decrease risk of falls and improve household ambulation.    Baseline  .24 ft/sec with RW    Time  8    Period  Weeks    Status  Revised      PT LONG TERM GOAL #6   Title  Patient will ascend/descend 3 steps using LRAD  with supervision in order to enter/exit home without use of a ramp.    Time  8    Period  Weeks    Status  New            Plan - 01/14/19 1020    Clinical Impression Statement  Continued gait training today but with rollator over level surfaces and then with addition of basic household obstacles and compliant surface.  Pt initially demonstrated safe gait with rollator with only min A for posture and balance on level surfaces.  Following obstacle negotiation pt experienced increased fatigue and began to demonstrate more impaired gait sequence requiring mod-max A.  With rest break pt able to continue.  Pt is demonstrating improved transfers, strength and standing balance but  continues to demonstrate limited endurance and activity tolerance.  Will continue to address in order to progress, decrease caregiver burden and decrease falls risk.    Personal Factors and Comorbidities  Past/Current Experience;Time since onset of injury/illness/exacerbation;Comorbidity 3+    Comorbidities  Hypertension, Diabetes, Chronic kidney disease    Examination-Activity Limitations  Locomotion Level;Transfers;Stand;Squat    Examination-Participation Restrictions  Community Activity    Stability/Clinical Decision Making  Evolving/Moderate complexity    Rehab Potential  Good    PT Frequency  Other (comment)   3x week/4 weeks, 2x week/4 weeks   PT Duration  8 weeks    PT Treatment/Interventions  ADLs/Self Care Home Management;Gait training;DME Instruction;Stair training;Functional mobility training;Therapeutic activities;Patient/family education;Neuromuscular re-education;Balance training;Therapeutic exercise;Passive range of motion    PT Next Visit Plan  Walking to/from car with rollator; to/from waiting area with rollator, endurance on [place], bed mobility, static standing balance, lateral/diagnonal weight shifting, postural strengthening/overhead activies on R, LE strengthening (sit <> stands w/ focus on anterior  pelvic tilt).Geanie Cooley and Agree with Plan of Care  Patient;Family member/caregiver    Family Member Consulted  Daughter, [name]       Patient will benefit from skilled therapeutic intervention in order to improve the following deficits and impairments:  Abnormal gait, Decreased activity tolerance, Decreased balance, Decreased range of motion, Decreased mobility, Decreased endurance, Decreased skin integrity, Decreased strength, Difficulty walking, Impaired perceived functional ability, Postural dysfunction  Visit Diagnosis: 1. Hemiplegia and hemiparesis following cerebral infarction affecting left non-dominant side ([place])   2. Muscle weakness (generalized)   3. Unsteadiness on feet   4. Other abnormalities of gait and mobility        Problem List Patient Active Problem List   Diagnosis Date Noted  . Lethargy 10/07/2018  . Pressure injury of skin 09/27/2018  . Pelvic fracture ([place]) 09/21/2018  . Allergic reaction caused by a drug 05/06/2018  . Bilateral leg edema 04/13/2018  . HLD (hyperlipidemia) 03/23/2018  . Mood swings 03/23/2018  . Chronic kidney disease (CKD), stage III (moderate) (HCC)   . Embolic stroke ([place]) 00/37/0488  . Left hemiparesis ([place])   . Diabetes mellitus type 2 in nonobese (HCC)   . Acute ischemic stroke ([place])   . Left arm weakness   . Acute CVA (cerebrovascular accident) ([place]) 02/24/2018  . Macular edema due to secondary diabetes ([place]) 02/24/2018  . Overweight (BMI 25.0-29.9) 02/24/2018  . CKD (chronic kidney disease), stage III ([place]) 02/24/2018  . Diabetes mellitus without complication ([place]) 89/16/9450  . HTN (hypertension) 09/02/2017  . Knee pain, bilateral 09/02/2017    Rico Junker, PT, DPT 01/14/19    10:24 AM    [facility] [address] [place], [place], [address] Phone: [phone]   Fax:  [phone]  Name: SOLSTICE LASTINGER MRN: [id number] Date of Birth:  1945-12-30

## 2019-01-15 ENCOUNTER — Encounter: Payer: Self-pay | Admitting: Physical Therapy

## 2019-01-15 ENCOUNTER — Encounter: Payer: Self-pay | Admitting: Occupational Therapy

## 2019-01-15 ENCOUNTER — Ambulatory Visit: Payer: Medicare Other | Admitting: Physical Therapy

## 2019-01-15 ENCOUNTER — Other Ambulatory Visit: Payer: Self-pay

## 2019-01-15 ENCOUNTER — Ambulatory Visit: Payer: Medicare Other | Admitting: Occupational Therapy

## 2019-01-15 DIAGNOSIS — G8929 Other chronic pain: Secondary | ICD-10-CM

## 2019-01-15 DIAGNOSIS — R2681 Unsteadiness on feet: Secondary | ICD-10-CM

## 2019-01-15 DIAGNOSIS — R2689 Other abnormalities of gait and mobility: Secondary | ICD-10-CM

## 2019-01-15 DIAGNOSIS — I69354 Hemiplegia and hemiparesis following cerebral infarction affecting left non-dominant side: Secondary | ICD-10-CM | POA: Diagnosis not present

## 2019-01-15 DIAGNOSIS — M6281 Muscle weakness (generalized): Secondary | ICD-10-CM

## 2019-01-15 DIAGNOSIS — M25612 Stiffness of left shoulder, not elsewhere classified: Secondary | ICD-10-CM

## 2019-01-15 DIAGNOSIS — M25512 Pain in left shoulder: Secondary | ICD-10-CM

## 2019-01-15 DIAGNOSIS — R278 Other lack of coordination: Secondary | ICD-10-CM

## 2019-01-15 NOTE — Therapy (Signed)
[facility] [address] [place], [place], [address] Phone: [phone]   Fax:  [phone]  Occupational Therapy Treatment  Patient Details  Name: [name] MRN: [id number] Date of Birth: 1946-04-13 Referring Provider (OT): Dr. Letta Pate   Encounter Date: 01/15/2019  OT End of Session - 01/15/19 1012    Visit Number  4    Number of Visits  17    Date for OT Re-Evaluation  02/16/19    Authorization Type  [employer] MCR    Authorization - Visit Number  4    Authorization - Number of Visits  10    OT Start Time  0850    OT Stop Time  0934    OT Time Calculation (min)  44 min    Activity Tolerance  Patient tolerated treatment well    Behavior During Therapy  [facility] for tasks assessed/performed       Past Medical History:  Diagnosis Date  . Allergy   . Arthritis   . Cataract   . Diabetes mellitus without complication ([place])   . Hypertension   . Macular degeneration syndrome    with edema---being treated.     Past Surgical History:  Procedure Laterality Date  . callous removal  Left 2017   located on 1st digit on L foot 2/2 diabetes  . EYE SURGERY      There were no vitals filed for this visit.  Subjective Assessment - 01/15/19 0852    Subjective   I don't have much pain anymore    Patient is accompanied by:  Family member    Pertinent History  CVA 02/27/18, pelvic fx and Lt hip  fx 09/2018. PMH: HTN, OA    Limitations  Fall risk, wounds on feet    Currently in Pain?  No/denies    Pain Score  0-No pain                   OT Treatments/Exercises (OP) - 01/15/19 0001      ADLs   LB Dressing  Patient has short term goal relating to dressing herself.  Patient and daughter indicate that she is able to put on a tshirt following set up.  Both indicate that daughter is dressing patient's lower body.  Worked on patient donning and doffing losse fitting pants.  Patient needed cueing to release one hand  from walker to allow use of two hands to pull up/down pants.  Patient able to don/doff pants with only min assist to help maintain balance over feet - patient with posterior bias in standing.  Patient needed cueing for sufficient weight shift forward in sitting to transition to standing.  Patient could use further practice on the safety of this transition - once lifted off surface, she eagerly grabs foir walker with right hand - fearful of full extension knees and hips.  Patient very receptive to verbal and physical feedback.  Patient able to reach to shoes with left hand to open/close velcro - encouraged daughter to facilitate this at home as well.      Functional Mobility  Patient needing facilitation in walking with RW to stand tall, to help promote hip extension, and allow larger step length.      ADL Comments  Reviewed short term goals, patient has had minimal OT sessions this week due to therapist availability..      Manual Therapy   Manual Therapy  Joint mobilization    Manual therapy comments  Worked  to promote increased passive motion in [facility] joint.  Patient after passive stretch with traction, able to achieve 100 degrees of passive flexion without pain.  Beyond 100 degrees (supine) pain = 5/10             OT Education - 01/15/19 1011    Education Details  Allow patient to put on clothing with as little assistance as needed on days there is sufficient time.  Allow patient to pull up/down clothing during toileting.    Person(s) Educated  Patient;Child(ren)    Methods  Explanation;Demonstration    Comprehension  Verbalized understanding       OT Short Term Goals - 01/15/19 1015      OT SHORT TERM GOAL #1   Title  Independent with initial HEP for shoulder ROM, and functional reaching LUE - due 01/16/19    Time  4    Period  Weeks    Status  On-going      OT SHORT TERM GOAL #2   Title  Pt to perform UB dressing with set up only and LE dressing with mod assist or less using A/E prn     Time  4    Period  Weeks    Status  Achieved      OT SHORT TERM GOAL #3   Title  Pt to increase LUE functional use as evidenced by performing 20 blocks on Box & Blocks test    Baseline  11 blocks    Time  4    Period  Weeks    Status  On-going      OT SHORT TERM GOAL #4   Title  Pt to perform toileting/clothes management with only min assist    Period  Weeks    Status  On-going   Able to complete - simulation -  in clinic     OT SHORT TERM GOAL #5   Title  Pt to achieve 90* shoulder flexion LUE for mid level reaching    Time  4    Period  Weeks    Status  On-going        OT Long Term Goals - 12/17/18 7829      OT LONG TERM GOAL #1   Title  Independent with updated HEP - due 02/16/19    Time  8    Period  Weeks    Status  New      OT LONG TERM GOAL #2   Title  Pt to be mod I with all BADLS using A/E as needed (except shower transfers to still be supervision)    Time  8    Period  Weeks    Status  New      OT LONG TERM GOAL #3   Title  Pt to improve LUE function as evidenced by performing 25 blocks or greater on Box & Blocks test    Time  8    Period  Weeks    Status  New      OT LONG TERM GOAL #4   Title  Pt to improve coordination as evidenced by performing 9 hole peg test completely in 90 sec or less    Baseline  placed 5 in 2 min.    Time  8    Period  Weeks    Status  New      OT LONG TERM GOAL #5   Title  Grip strength Lt hand to be 15 lbs or greater  Baseline  8 lbs    Time  8    Period  Weeks    Status  New      Long Term Additional Goals   Additional Long Term Goals  Yes      OT LONG TERM GOAL #6   Title  Pt to perform simple snack, cold food, and microwaveable food mod I level with DME prn without LOB    Time  8    Period  Weeks    Status  New      OT LONG TERM GOAL #7   Title  Pt to achieve 110* or greater P/ROM Lt shoulder with pain less than or equal to 5/10    Time  8    Period  Weeks    Status  New            Plan -  01/15/19 1012    Clinical Impression Statement  Patient showing progress with reduced LUE pain, imporved passive motion, improved activity tolerance, and improving balance such that patient requires less assistance for basic dressing tasks in clinic.  Needto incorporate this into home situation.  Daughter and patient aware.    OT Frequency  2x / week    OT Duration  8 weeks    OT Treatment/Interventions  Self-care/ADL training;Therapeutic exercise;Functional Mobility Training;Neuromuscular education;Aquatic Therapy;Manual Therapy;Splinting;Therapeutic activities;Paraffin;DME and/or AE instruction;Cognitive remediation/compensation;Electrical Stimulation;Visual/perceptual remediation/compensation;Moist Heat;Passive range of motion;Patient/family education    Plan  ask about LB dressing and HEP, LUE NMR and manual in pain free ranges- build on HEP if possible    Consulted and Agree with Plan of Care  Patient;Family member/caregiver    Family Member Consulted  Daughter Unk Lightning)       Patient will benefit from skilled therapeutic intervention in order to improve the following deficits and impairments:           Visit Diagnosis: 1. Hemiplegia and hemiparesis following cerebral infarction affecting left non-dominant side (HCC)   2. Stiffness of left shoulder, not elsewhere classified   3. Chronic left shoulder pain   4. Muscle weakness (generalized)   5. Unsteadiness on feet   6. Other lack of coordination       Problem List Patient Active Problem List   Diagnosis Date Noted  . Lethargy 10/07/2018  . Pressure injury of skin 09/27/2018  . Pelvic fracture ([place]) 09/21/2018  . Allergic reaction caused by a drug 05/06/2018  . Bilateral leg edema 04/13/2018  . HLD (hyperlipidemia) 03/23/2018  . Mood swings 03/23/2018  . Chronic kidney disease (CKD), stage III (moderate) (HCC)   . Embolic stroke ([place]) 51/88/4166  . Left hemiparesis ([place])   . Diabetes mellitus type 2 in nonobese (HCC)   .  Acute ischemic stroke ([place])   . Left arm weakness   . Acute CVA (cerebrovascular accident) ([place]) 02/24/2018  . 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 Overweight (BMI 25.0-29.9) 02/24/2018  . CKD (chronic kidney disease), stage III ([place]) 02/24/2018  . Diabetes mellitus without complication ([place]) 01/05/1600  . HTN (hypertension) 09/02/2017  . Knee pain, bilateral 09/02/2017    Mariah Milling, OTR/L 01/15/2019, 10:17 AM  [facility] [address] [place], [place], [address] Phone: [phone]   Fax:  [phone]  Name: VARETTA CHAVERS MRN: [id number] Date of Birth: 1946/02/11

## 2019-01-15 NOTE — Therapy (Signed)
[facility] [address] [place], [place], [address] Phone: [phone]   Fax:  [phone]  Physical Therapy Treatment  Patient Details  Name: [name] MRN: [id number] Date of Birth: 04-27-1946 Referring Provider (PT): Alysia Penna, MD   Encounter Date: 01/15/2019   CLINIC OPERATION CHANGES: [facility] is open at lower capacity following universal masking, social distancing, and patient screening.  The patient's COVID risk of complications score is 5.   PT End of Session - 01/15/19 1249    Visit Number  8    Number of Visits  21    Date for PT Re-Evaluation  02/19/19    Authorization Type  [employer] Medicare, 10th visit progress note    PT Start Time  0804    PT Stop Time  0848    PT Time Calculation (min)  44 min    Activity Tolerance  Patient tolerated treatment well    Behavior During Therapy  WFL for tasks assessed/performed       Past Medical History:  Diagnosis Date  . Allergy   . Arthritis   . Cataract   . Diabetes mellitus without complication ([place])   . Hypertension   . Macular degeneration syndrome    with edema---being treated.     Past Surgical History:  Procedure Laterality Date  . callous removal  Left 2017   located on 1st digit on L foot 2/2 diabetes  . EYE SURGERY      There were no vitals filed for this visit.  Subjective Assessment - 01/15/19 0805    Subjective  Third day of therapy in a row.  Feeling good, was not overly fatigued yesterday with all of the walking.  Going to have OT today after PT.    Patient is accompained by:  Family member   daughter   Pertinent History  Patient had a R CVA 02/2018 and was walking with a SPC prior to a fall in March 2020 resulting in being hospitalized with multiple pelvic fxs and a L hip fx. During hospital stay, patient acquired pressure ulcers on bilateral soles of feet near heels.Patient is now safely able to bear weight and  pressure ulcers are now a stage 1 (per daughter report and photos).    Patient Stated Goals  "Be back to normal, get back to walking, and getting strength back"    Currently in Pain?  No/denies                       [facility] Adult PT Treatment/Exercise - 01/15/19 7858      Ambulation/Gait   Ambulation/Gait  Yes    Ambulation/Gait Assistance  4: Min assist;3: Mod assist    Ambulation/Gait Assistance Details  with rollator outside over uneven pavement.  Cues on inclines for more upright posture and use of brake to slow forward momentum when descending incline.  Also required cues to maintain full step length with LLE and for increased foot clearance over uneven surfaces.  When turning to sit in chair for rest break pt required cues to not look at the seat but to look forward and pivot fully prior to sitting.    Ambulation Distance (Feet)  75 Feet    Assistive device  Rollator    Gait Pattern  Step-to pattern;Step-through pattern;Decreased step length - left;Decreased stride length;Decreased hip/knee flexion - left;Decreased dorsiflexion - left;Right foot flat;Left foot flat;Trunk flexed;Poor foot clearance - left    Ambulation Surface  Unlevel;Outdoor;Paved    Ramp  3: Mod assist    Ramp Details (indicate cue type and reason)  rollator; verbal cues for safety when descending incline    Curb  3: Mod assist    Curb Details (indicate cue type and reason)  rollator; verbal and visual cues for safe technique and sequencing      Knee/Hip Exercises: Standing   Forward Step Up  Right;Left;1 set;5 reps;Hand Hold: 2;Step Height: 4"    Forward Step Up Limitations  on 4" step with bilat UE support on // bars.  Attempted to perform step up with contralateral LE coming forward to tap taller object but pt unable to shift weight forward to tap.  Changed to step up with contralateral step through.  Requires mod A on larger step due to tendency to keep hip and knee in flexion and COG posterior to BOS.                PT Education - 01/15/19 1249    Education Details  gait training with rollator outside over pavement    Person(s) Educated  Patient;Child(ren)    Methods  Explanation;Demonstration    Comprehension  Need further instruction       PT Short Term Goals - 01/01/19 1258      PT SHORT TERM GOAL #1   Title  Patient will be independent with initial HEP focusing on bilateral LE strength and balance to build upon gains made in therapy.    Time  4    Period  Weeks    Status  New    Target Date  01/20/19      PT SHORT TERM GOAL #2   Title  Patient will increase BERG score to at least a 20/56 in order to help decrease risk of future falls.    Baseline  11/56    Time  4    Period  Weeks    Status  New    Target Date  01/20/19      PT SHORT TERM GOAL #3   Title  Patient will perform sit <> stand with min A from therapist and no UE support in order to help decrease caregiver burden at home during transfers.    Baseline  Mod A, requires UE support for balance stability    Time  4    Period  Weeks    Status  New    Target Date  01/20/19      PT SHORT TERM GOAL #4   Title  Patient will ambulate 100 ft with LRAD and CGA from therapist in order to improve household functional mobility.    Baseline  45 feet, mod A from therapist using RW    Time  4    Period  Weeks    Status  New    Target Date  01/20/19      PT SHORT TERM GOAL #5   Title  Patient will participate in further assessment of gait speed.    Baseline  .75 ft/sec    Time  4    Period  Weeks    Status  Achieved    Target Date  01/20/19      PT SHORT TERM GOAL #6   Title  Patient will perform all bed mobility with supervision in order to decrease caregiver burden and increase independence.    Time  4    Period  Weeks    Status  New    Target Date  01/20/19        PT Long Term Goals - 01/01/19 1259      PT LONG TERM GOAL #1   Title  Patient will be independent with final HEP in order to continue  building upon functional gains made in therapy.  (DUE 02/19/19)    Time  8    Period  Weeks    Status  New      PT LONG TERM GOAL #2   Title  Patient will ambulate at least 150 feet with LRAD and supervision in order to improve functional mobility in the household and community.    Baseline  45 feet with RW, mod A from therapist    Time  8    Period  Weeks    Status  New      PT LONG TERM GOAL #3   Title  Patient will increase BERG score to at least 35/56 in order to decrease risk of future falls.    Baseline  11/56 on 12/21/18    Time  8    Period  Weeks      PT LONG TERM GOAL #4   Title  Patient will perform functional transfers and sit <> stands with mod I in order to decrease caregiver burden.    Baseline  Mod A with stand step transfers and sit <> stand from low surface    Time  8    Period  Weeks    Status  New      PT LONG TERM GOAL #5   Title  Patient will increase gait speed to at least 1.8 ft/sec in order to decrease risk of falls and improve household ambulation.    Baseline  .88 ft/sec with RW    Time  8    Period  Weeks    Status  Revised      PT LONG TERM GOAL #6   Title  Patient will ascend/descend 3 steps using LRAD with supervision in order to enter/exit home without use of a ramp.    Time  8    Period  Weeks    Status  New            Plan - 01/15/19 1251    Clinical Impression Statement  Progressed gait training with rollator outside over uneven pavement including inclines and up/down curbs. Pt required min-mod A to perform safely and continues to fatigue quickly.  Continued LE strengthening, training for forwards weight shifting to improve LE clearance and step length with step ups on larger step today.  Pt less able to fully extend each LE or fully shift weight forwards on larger step which continues to limit her ability to perform stairs safely and ambulate with decreased assistance.  Will continue to address in order to progress towards LTG.     Personal Factors and Comorbidities  Past/Current Experience;Time since onset of injury/illness/exacerbation;Comorbidity 3+    Comorbidities  Hypertension, Diabetes, Chronic kidney disease    Examination-Activity Limitations  Locomotion Level;Transfers;Stand;Squat    Examination-Participation Restrictions  Community Activity    Stability/Clinical Decision Making  Evolving/Moderate complexity    Rehab Potential  Good    PT Frequency  Other (comment)   3x week/4 weeks, 2x week/4 weeks   PT Duration  8 weeks    PT Treatment/Interventions  ADLs/Self Care Home Management;Gait training;DME Instruction;Stair training;Functional mobility training;Therapeutic activities;Patient/family education;Neuromuscular re-education;Balance training;Therapeutic exercise;Passive range of motion    PT Next Visit Plan  Walking to/from car with rollator; to/from  waiting area with rollator, endurance on Nustep, bed mobility, lateral/diagnonal weight shifting, postural strengthening/overhead activies on R, LE strengthening (sit <> stands w/ focus on anterior pelvic tilt) and LE extensor strengthening    Consulted and Agree with Plan of Care  Patient;Family member/caregiver    Family Member Consulted  Daughter, [name]       Patient will benefit from skilled therapeutic intervention in order to improve the following deficits and impairments:  Abnormal gait, Decreased activity tolerance, Decreased balance, Decreased range of motion, Decreased mobility, Decreased endurance, Decreased skin integrity, Decreased strength, Difficulty walking, Impaired perceived functional ability, Postural dysfunction  Visit Diagnosis: 1. Unsteadiness on feet   2. Other abnormalities of gait and mobility   3. Muscle weakness (generalized)   4. Hemiplegia and hemiparesis following cerebral infarction affecting left non-dominant side [facility])        Problem List Patient Active Problem List   Diagnosis Date Noted  . Lethargy 10/07/2018  .  Pressure injury of skin 09/27/2018  . Pelvic fracture ([place]) 09/21/2018  . Allergic reaction caused by a drug 05/06/2018  . Bilateral leg edema 04/13/2018  . HLD (hyperlipidemia) 03/23/2018  . Mood swings 03/23/2018  . Chronic kidney disease (CKD), stage III (moderate) (HCC)   . Embolic stroke ([place]) 94/01/6807  . Left hemiparesis ([place])   . Diabetes mellitus type 2 in nonobese (HCC)   . Acute ischemic stroke ([place])   . Left arm weakness   . Acute CVA (cerebrovascular accident) ([place]) 02/24/2018  . Macular edema due to secondary diabetes ([place]) 02/24/2018  . Overweight (BMI 25.0-29.9) 02/24/2018  . CKD (chronic kidney disease), stage III ([place]) 02/24/2018  . Diabetes mellitus without complication ([place]) 81/04/3158  . HTN (hypertension) 09/02/2017  . Knee pain, bilateral 09/02/2017    Rico Junker, PT, DPT 01/15/19    12:59 PM    [facility] [address] [place], [place], [address] Phone: [phone]   Fax:  [phone]  Name: [name] MRN: [id number] Date of Birth: [birth year]

## 2019-01-18 ENCOUNTER — Ambulatory Visit: Payer: Medicare Other | Admitting: Physical Therapy

## 2019-01-18 ENCOUNTER — Encounter: Payer: Self-pay | Admitting: Occupational Therapy

## 2019-01-18 ENCOUNTER — Other Ambulatory Visit: Payer: Self-pay

## 2019-01-18 ENCOUNTER — Ambulatory Visit: Payer: Medicare Other | Admitting: Occupational Therapy

## 2019-01-18 DIAGNOSIS — R278 Other lack of coordination: Secondary | ICD-10-CM

## 2019-01-18 DIAGNOSIS — G8929 Other chronic pain: Secondary | ICD-10-CM

## 2019-01-18 DIAGNOSIS — R2689 Other abnormalities of gait and mobility: Secondary | ICD-10-CM

## 2019-01-18 DIAGNOSIS — M25612 Stiffness of left shoulder, not elsewhere classified: Secondary | ICD-10-CM

## 2019-01-18 DIAGNOSIS — I69354 Hemiplegia and hemiparesis following cerebral infarction affecting left non-dominant side: Secondary | ICD-10-CM

## 2019-01-18 DIAGNOSIS — R2681 Unsteadiness on feet: Secondary | ICD-10-CM

## 2019-01-18 DIAGNOSIS — M6281 Muscle weakness (generalized): Secondary | ICD-10-CM

## 2019-01-18 NOTE — Therapy (Signed)
[facility] [address] [place], [place], [address] Phone: [phone]   Fax:  [phone]  Physical Therapy Treatment  Patient Details  Name: [name] MRN: [id number] Date of Birth: [birth year] Referring Provider (PT): Alysia Penna, MD  CLINIC OPERATION CHANGES: Outpatient [facility] Rehab is open at lower capacity following universal masking, social distancing, and patient screening.  The patient's COVID risk of complications score is 5.   Encounter Date: 01/18/2019  PT End of Session - 01/18/19 1037    Visit Number  9    Number of Visits  21    Date for PT Re-Evaluation  02/19/19    Authorization Type  [employer] Medicare, 10th visit progress note    PT Start Time  0758    PT Stop Time  0846    PT Time Calculation (min)  48 min    Activity Tolerance  Patient tolerated treatment well;Patient limited by fatigue    Behavior During Therapy  [facility] for tasks assessed/performed       Past Medical History:  Diagnosis Date  . Allergy   . Arthritis   . Cataract   . Diabetes mellitus without complication ([place])   . Hypertension   . Macular degeneration syndrome    with edema---being treated.     Past Surgical History:  Procedure Laterality Date  . callous removal  Left 2017   located on 1st digit on L foot 2/2 diabetes  . EYE SURGERY      There were no vitals filed for this visit.  Subjective Assessment - 01/18/19 0756    Subjective  Has been having difficulty sleeping through the night. Still having foot pain more on L>R.    Patient is accompained by:  Family member   daughter   Pertinent History  Patient had a R CVA 02/2018 and was walking with a SPC prior to a fall in March 2020 resulting in being hospitalized with multiple pelvic fxs and a L hip fx. During hospital stay, patient acquired pressure ulcers on bilateral soles of feet near heels.Patient is now safely able to bear weight and pressure ulcers are now a  stage 1 (per daughter report and photos).    Patient Stated Goals  "Be back to normal, get back to walking, and getting strength back"    Currently in Pain?  No/denies                       [facility] Adult PT Treatment/Exercise - 01/18/19 0811      Transfers   Sit to Stand  4: Min assist    Sit to Stand Details  Verbal cues for sequencing;Verbal cues for technique;Visual cues/gestures for sequencing    Sit to Stand Details (indicate cue type and reason)  From low mat table. With rollator in front of patient to assist with shifting COG anteriorly. Patient with hands on seat of rollator and weight shifting forward to lift glutes off table while keeping nose over seat of rollator; 2 x 5 reps lateral weight shifting, cues to shift towards L before coming back down to sitting. Patient with good carryover to perform sit <> stand by keeping COG anteriorly.       Ambulation/Gait   Ambulation/Gait  Yes    Ambulation/Gait Assistance  4: Min assist    Ambulation/Gait Assistance Details  Patient demonstrated increased fatigue today during ambulation as noted by increased forward flexion and step to pattern, requiring intermittent static standing breaks  and an increased seated rest break after NuStep and ambulating 115'. Cueing for heel > toe pattern, increased RLE foot clearance, and upright posture by keeping gaze 6' in front of rollator. Went over with pt how to lock and unlock brakes on rollator, pt able to use L grip strength to perform tasks with supervision from therapist and cueing for proper hand placement.       Ambulation Distance (Feet)  115 Feet    Assistive device  Rollator    Gait Pattern  Step-through pattern;Step-to pattern;Decreased step length - right;Decreased dorsiflexion - right;Decreased weight shift to left;Decreased hip/knee flexion - right;Poor foot clearance - right    Ambulation Surface  Level;Indoor      Dynamic Standing Balance   Forward lean/weight shifting  comments:  Standing with rollator and min guard x3 minutes: working on standing tall with mirror in front of patient, finding midline, lateral weight shifting, progressing to lifting one UE off of rollator.       Knee/Hip Exercises: Aerobic   Nustep  Workload 3, 8:30 minutes, max HR at 86 bpm              PT Education - 01/18/19 0902    Education Details  Discussion with daughter about HR max during aerobic exercise, went over with patient how to use brakes on rollator and locking them in standing    Person(s) Educated  Patient;Child(ren)    Methods  Explanation;Demonstration    Comprehension  Verbalized understanding       PT Short Term Goals - 01/01/19 1258      PT SHORT TERM GOAL #1   Title  Patient will be independent with initial HEP focusing on bilateral LE strength and balance to build upon gains made in therapy.    Time  4    Period  Weeks    Status  New    Target Date  01/20/19      PT SHORT TERM GOAL #2   Title  Patient will increase BERG score to at least a 20/56 in order to help decrease risk of future falls.    Baseline  11/56    Time  4    Period  Weeks    Status  New    Target Date  01/20/19      PT SHORT TERM GOAL #3   Title  Patient will perform sit <> stand with min A from therapist and no UE support in order to help decrease caregiver burden at home during transfers.    Baseline  Mod A, requires UE support for balance stability    Time  4    Period  Weeks    Status  New    Target Date  01/20/19      PT SHORT TERM GOAL #4   Title  Patient will ambulate 100 ft with LRAD and CGA from therapist in order to improve household functional mobility.    Baseline  45 feet, mod A from therapist using RW    Time  4    Period  Weeks    Status  New    Target Date  01/20/19      PT SHORT TERM GOAL #5   Title  Patient will participate in further assessment of gait speed.    Baseline  .66 ft/sec    Time  4    Period  Weeks    Status  Achieved    Target  Date  01/20/19  PT SHORT TERM GOAL #6   Title  Patient will perform all bed mobility with supervision in order to decrease caregiver burden and increase independence.    Time  4    Period  Weeks    Status  New    Target Date  01/20/19        PT Long Term Goals - 01/01/19 1259      PT LONG TERM GOAL #1   Title  Patient will be independent with final HEP in order to continue building upon functional gains made in therapy.  (DUE 02/19/19)    Time  8    Period  Weeks    Status  New      PT LONG TERM GOAL #2   Title  Patient will ambulate at least 150 feet with LRAD and supervision in order to improve functional mobility in the household and community.    Baseline  45 feet with RW, mod A from therapist    Time  8    Period  Weeks    Status  New      PT LONG TERM GOAL #3   Title  Patient will increase BERG score to at least 35/56 in order to decrease risk of future falls.    Baseline  11/56 on 12/21/18    Time  8    Period  Weeks      PT LONG TERM GOAL #4   Title  Patient will perform functional transfers and sit <> stands with mod I in order to decrease caregiver burden.    Baseline  Mod A with stand step transfers and sit <> stand from low surface    Time  8    Period  Weeks    Status  New      PT LONG TERM GOAL #5   Title  Patient will increase gait speed to at least 1.8 ft/sec in order to decrease risk of falls and improve household ambulation.    Baseline  .32 ft/sec with RW    Time  8    Period  Weeks    Status  Revised      PT LONG TERM GOAL #6   Title  Patient will ascend/descend 3 steps using LRAD with supervision in order to enter/exit home without use of a ramp.    Time  8    Period  Weeks    Status  New            Plan - 01/18/19 1039    Clinical Impression Statement  Patient with increased fatigue today during gait with rollator after aerobic exercise on the NuStep; requiring multiple static standing rest breaks. During static standing with  rollator, pt with improved ability to detect midline and shift weight towards L while increasing upright tolerance. Patient with good carryover for shifting COG anteriorly during sit to stands after practicing forward lean towards seat of rollator to clear buttocks off mat table. Will continue to progress towards LTGs.    Personal Factors and Comorbidities  Past/Current Experience;Time since onset of injury/illness/exacerbation;Comorbidity 3+    Comorbidities  Hypertension, Diabetes, Chronic kidney disease    Examination-Activity Limitations  Locomotion Level;Transfers;Stand;Squat    Examination-Participation Restrictions  Community Activity    Stability/Clinical Decision Making  Evolving/Moderate complexity    Rehab Potential  Good    PT Frequency  Other (comment)   3x week/4 weeks, 2x week/4 weeks   PT Duration  8 weeks    PT Treatment/Interventions  ADLs/Self Care Home Management;Gait training;DME Instruction;Stair training;Functional mobility training;Therapeutic activities;Patient/family education;Neuromuscular re-education;Balance training;Therapeutic exercise;Passive range of motion    PT Next Visit Plan  Check short term goals; 10TH VISIT progress note. Walking to/from car with rollator; to/from waiting area with rollator, endurance on Nustep, bed mobility, lateral/diagnonal weight shifting and stepping backwards and side to side, postural strengthening/overhead activies on R, LE strengthening (sit <> stands w/ focus on anterior pelvic tilt), LE extensor strengthening    Consulted and Agree with Plan of Care  Patient;Family member/caregiver    Family Member Consulted  Daughter, [name]       Patient will benefit from skilled therapeutic intervention in order to improve the following deficits and impairments:  Abnormal gait, Decreased activity tolerance, Decreased balance, Decreased range of motion, Decreased mobility, Decreased endurance, Decreased skin integrity, Decreased strength,  Difficulty walking, Impaired perceived functional ability, Postural dysfunction  Visit Diagnosis: 1. Hemiplegia and hemiparesis following cerebral infarction affecting left non-dominant side ([place])   2. Unsteadiness on feet   3. Other lack of coordination   4. Other abnormalities of gait and mobility   5. Muscle weakness (generalized)        Problem List Patient Active Problem List   Diagnosis Date Noted  . Lethargy 10/07/2018  . Pressure injury of skin 09/27/2018  . Pelvic fracture ([place]) 09/21/2018  . Allergic reaction caused by a drug 05/06/2018  . Bilateral leg edema 04/13/2018  . HLD (hyperlipidemia) 03/23/2018  . Mood swings 03/23/2018  . Chronic kidney disease (CKD), stage III (moderate) (HCC)   . Embolic stroke ([place]) 86/76/1950  . Left hemiparesis ([place])   . Diabetes mellitus type 2 in nonobese (HCC)   . Acute ischemic stroke ([place])   . Left arm weakness   . Acute CVA (cerebrovascular accident) ([place]) 02/24/2018  . 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 Overweight (BMI 25.0-29.9) 02/24/2018  . CKD (chronic kidney disease), stage III ([place]) 02/24/2018  . Diabetes mellitus without complication ([place]) 93/26/7124  . HTN (hypertension) 09/02/2017  . Knee pain, bilateral 09/02/2017    Lillia Pauls, DPT 01/18/2019, 10:49 AM  [facility] [address] [place], [place], [address] Phone: [phone]   Fax:  [phone]  Name: [name] MRN: [id number] Date of Birth: Jul 04, 1946

## 2019-01-18 NOTE — Therapy (Signed)
[facility] [address] [place], [place], [address] Phone: [phone]   Fax:  [phone]  Occupational Therapy Treatment  Patient Details  Name: [name] MRN: [id number] Date of Birth: 11/05/1945 Referring Provider (OT): Dr. Letta Pate   Encounter Date: 01/18/2019  OT End of Session - 01/18/19 0759    Visit Number  5    Number of Visits  17    Date for OT Re-Evaluation  02/16/19    Authorization Type  [employer] MCR    Authorization - Visit Number  5    Authorization - Number of Visits  10    OT Start Time  0707    OT Stop Time  0750    OT Time Calculation (min)  43 min    Activity Tolerance  Patient tolerated treatment well    Behavior During Therapy  [facility] for tasks assessed/performed       Past Medical History:  Diagnosis Date  . Allergy   . Arthritis   . Cataract   . Diabetes mellitus without complication ([place])   . Hypertension   . Macular degeneration syndrome    with edema---being treated.     Past Surgical History:  Procedure Laterality Date  . callous removal  Left 2017   located on 1st digit on L foot 2/2 diabetes  . EYE SURGERY      There were no vitals filed for this visit.  Subjective Assessment - 01/18/19 0758    Subjective   Using it about the same.  Dressing is better.    Patient is accompanied by:  Family member    Pertinent History  CVA 02/27/18, pelvic fx and Lt hip  fx 09/2018. PMH: HTN, OA    Limitations  Fall risk, wounds on feet    Currently in Pain?  No/denies       CLINIC OPERATION CHANGES: Outpatient Neuro Rehab is open at lower capacity following universal masking, social distancing, and patient screening.  The patient's COVID risk of complications score is 5.   In supine, AAROM shoulder flex and chest press with BUEs with ball with min-mod facilitation/cueing for normal movement patterns.  Then [facility] ER with min cueing/facilitation.  In sitting, with hands on mat in  ER, scapular retraction and trunk ext as able with OT facilitating stretch at shoulders.   AAROM shoulder flex with BUEs with ball with min-mod facilitation/cueing for normal movement patterns and incr scapular movement/posture.  Wt. Bearing through LUE with hand on mat and body on arm movements with min-mod cueing/min facilitation for incr scapular stability and tricep activation.  Trunk rotations to both sides with UEs for incr wt. Shift and trunk activation.  Lateral wt. Shift to L with LUE/BUEs supported on ball within pain free range.  Functional reaching in forward flex with positioning to encourage wt. Shift forward and to the L and laterally to encourage ER/abduction as able.  Min-mod cueing for normal movement patterns.  Encouraged pt to perform functional reaching at home with emphasis on lateral wt. Shift and shoulder ER in flex and abduction.    Recommended vertical towel roll along spine when laying down and in sitting to encourage improved posture and shoulder positioning.     OT Short Term Goals - 01/18/19 0729      OT SHORT TERM GOAL #1   Title  Independent with initial HEP for shoulder ROM, and functional reaching LUE - due 01/16/19    Time  4  Period  Weeks    Status  On-going      OT SHORT TERM GOAL #2   Title  Pt to perform UB dressing with set up only and LE dressing with mod assist or less using A/E prn    Time  4    Period  Weeks    Status  Achieved      OT SHORT TERM GOAL #3   Title  Pt to increase LUE functional use as evidenced by performing 20 blocks on Box & Blocks test    Baseline  11 blocks    Time  4    Period  Weeks    Status  On-going      OT SHORT TERM GOAL #4   Title  Pt to perform toileting/clothes management with only min assist    Period  Weeks    Status  On-going   Able to complete - simulation -  in clinic     OT SHORT TERM GOAL #5   Title  Pt to achieve 90* shoulder flexion LUE for mid level reaching    Time  4    Period  Weeks    Status   On-going   01/18/19:  75* with min compensation       OT Long Term Goals - 12/17/18 0925      OT LONG TERM GOAL #1   Title  Independent with updated HEP - due 02/16/19    Time  8    Period  Weeks    Status  New      OT LONG TERM GOAL #2   Title  Pt to be mod I with all BADLS using A/E as needed (except shower transfers to still be supervision)    Time  8    Period  Weeks    Status  New      OT LONG TERM GOAL #3   Title  Pt to improve LUE function as evidenced by performing 25 blocks or greater on Box & Blocks test    Time  8    Period  Weeks    Status  New      OT LONG TERM GOAL #4   Title  Pt to improve coordination as evidenced by performing 9 hole peg test completely in 90 sec or less    Baseline  placed 5 in 2 min.    Time  8    Period  Weeks    Status  New      OT LONG TERM GOAL #5   Title  Grip strength Lt hand to be 15 lbs or greater    Baseline  8 lbs    Time  8    Period  Weeks    Status  New      Long Term Additional Goals   Additional Long Term Goals  Yes      OT LONG TERM GOAL #6   Title  Pt to perform simple snack, cold food, and microwaveable food mod I level with DME prn without LOB    Time  8    Period  Weeks    Status  New      OT LONG TERM GOAL #7   Title  Pt to achieve 110* or greater P/ROM Lt shoulder with pain less than or equal to 5/10    Time  8    Period  Weeks    Status  New  Plan - 01/18/19 0759    Clinical Impression Statement  Pt is improving with improved LUE control.  Pt continues to demo decr midline alignment and wt. shift to the L side and decr postural alignment affecting LUE functional use.  Emphasized importance of posture for LUE functional use and ROM.    OT Frequency  2x / week    OT Duration  8 weeks    OT Treatment/Interventions  Self-care/ADL training;Therapeutic exercise;Functional Mobility Training;Neuromuscular education;Aquatic Therapy;Manual Therapy;Splinting;Therapeutic activities;Paraffin;DME  and/or AE instruction;Cognitive remediation/compensation;Electrical Stimulation;Visual/perceptual remediation/compensation;Moist Heat;Passive range of motion;Patient/family education    Plan  LUE/trunk NMR and manual in pain free ranges- build on HEP if possible (check goals next week due to decr frequency)    Consulted and Agree with Plan of Care  Patient;Family member/caregiver    Family Member Consulted  Daughter Unk Lightning)       Patient will benefit from skilled therapeutic intervention in order to improve the following deficits and impairments:           Visit Diagnosis: 1. Hemiplegia and hemiparesis following cerebral infarction affecting left non-dominant side (HCC)   2. Stiffness of left shoulder, not elsewhere classified   3. Chronic left shoulder pain   4. Unsteadiness on feet   5. Other lack of coordination   6. Other abnormalities of gait and mobility   7. Muscle weakness (generalized)       Problem List Patient Active Problem List   Diagnosis Date Noted  . Lethargy 10/07/2018  . Pressure injury of skin 09/27/2018  . Pelvic fracture ([place]) 09/21/2018  . Allergic reaction caused by a drug 05/06/2018  . Bilateral leg edema 04/13/2018  . HLD (hyperlipidemia) 03/23/2018  . Mood swings 03/23/2018  . Chronic kidney disease (CKD), stage III (moderate) (HCC)   . Embolic stroke ([place]) 88/05/314  . Left hemiparesis ([place])   . Diabetes mellitus type 2 in nonobese (HCC)   . Acute ischemic stroke ([place])   . Left arm weakness   . Acute CVA (cerebrovascular accident) ([place]) 02/24/2018  . Macular edema due to secondary diabetes ([place]) 02/24/2018  . Overweight (BMI 25.0-29.9) 02/24/2018  . CKD (chronic kidney disease), stage III ([place]) 02/24/2018  . Diabetes mellitus without complication ([place]) 94/58/5929  . HTN (hypertension) 09/02/2017  . Knee pain, bilateral 09/02/2017    [facility] 01/18/2019, 8:02 AM  [facility] [address] [name], [place], [address] Phone: [phone]   Fax:  [phone]  Name: JILLYN STACEY MRN: [id number] Date of Birth: March 08, 1946   Vianne Bulls, OTR/L [facility] [address] [place], [place]  [address] [phone] phone [phone] 01/18/19 8:02 AM

## 2019-01-20 ENCOUNTER — Ambulatory Visit: Payer: Medicare Other | Admitting: Physical Therapy

## 2019-01-20 ENCOUNTER — Encounter: Payer: Self-pay | Admitting: Occupational Therapy

## 2019-01-20 ENCOUNTER — Ambulatory Visit: Payer: Medicare Other | Admitting: Occupational Therapy

## 2019-01-20 ENCOUNTER — Encounter: Payer: Self-pay | Admitting: Physical Therapy

## 2019-01-20 ENCOUNTER — Other Ambulatory Visit: Payer: Self-pay

## 2019-01-20 DIAGNOSIS — R2689 Other abnormalities of gait and mobility: Secondary | ICD-10-CM

## 2019-01-20 DIAGNOSIS — G8929 Other chronic pain: Secondary | ICD-10-CM

## 2019-01-20 DIAGNOSIS — I69354 Hemiplegia and hemiparesis following cerebral infarction affecting left non-dominant side: Secondary | ICD-10-CM | POA: Diagnosis not present

## 2019-01-20 DIAGNOSIS — M25512 Pain in left shoulder: Secondary | ICD-10-CM

## 2019-01-20 DIAGNOSIS — R278 Other lack of coordination: Secondary | ICD-10-CM

## 2019-01-20 DIAGNOSIS — M25612 Stiffness of left shoulder, not elsewhere classified: Secondary | ICD-10-CM

## 2019-01-20 DIAGNOSIS — M6281 Muscle weakness (generalized): Secondary | ICD-10-CM

## 2019-01-20 DIAGNOSIS — R2681 Unsteadiness on feet: Secondary | ICD-10-CM

## 2019-01-20 NOTE — Therapy (Signed)
[facility] [address] [place], [place], [address] Phone: [phone]   Fax:  [phone]  Occupational Therapy Treatment  Patient Details  Name: [name] MRN: [id number] Date of Birth: 08/17/1945 Referring Provider (OT): Dr. Letta Pate   Encounter Date: 01/20/2019  OT End of Session - 01/20/19 0950    Visit Number  6    Number of Visits  17    Date for OT Re-Evaluation  02/16/19    Authorization Type  [employer] MCR    Authorization - Visit Number  6    Authorization - Number of Visits  10    OT Start Time  0708    OT Stop Time  0754    OT Time Calculation (min)  46 min    Activity Tolerance  Patient tolerated treatment well    Behavior During Therapy  [facility] for tasks assessed/performed       Past Medical History:  Diagnosis Date  . Allergy   . Arthritis   . Cataract   . Diabetes mellitus without complication ([place])   . Hypertension   . Macular degeneration syndrome    with edema---being treated.     Past Surgical History:  Procedure Laterality Date  . callous removal  Left 2017   located on 1st digit on L foot 2/2 diabetes  . EYE SURGERY      There were no vitals filed for this visit.  Subjective Assessment - 01/20/19 0949    Subjective   Dtr. requests to work on getting out of bed due to difficulty.    Patient is accompanied by:  Family member    Pertinent History  CVA 02/27/18, pelvic fx and Lt hip  fx 09/2018. PMH: HTN, OA    Limitations  Fall risk, wounds on feet    Currently in Pain?  No/denies       CLINIC OPERATION CHANGES: Outpatient Neuro Rehab is open at lower capacity following universal masking, social distancing, and patient screening.  The patient's COVID risk of complications score is 5.   Bed mobility.  Sitting>supine with min cueing/A for alignment.  Supine>Sitting with min facilitation to start push up once rolled to side.  Practiced in both directions and dtr. Shown how to  facilitate/cue pt.  Pt/dtr verbalized understanding of normal movement pattern techniques.  In supine, AAROM shoulder flex and chest press with BUEs with ball with min-mod facilitation/cueing for normal movement patterns.    In sitting, AAROM shoulder flex with BUEs with ball with min facilitation/cueing for normal movement patterns and incr scapular movement/posture.  Wt. Bearing through LUE with hand on mat and body on arm movements with min-mod cueing/min facilitation for incr scapular stability and tricep activation.  Functional reaching in forward flex with positioning to encourage wt. Shift forward and to the L and laterally to encourage ER/abduction as able. Emphasized performing with disassociation/coordination of elbow/shoulder movements and supination for improved normal movement patterns with min-mod cueing.    Isolated elbow flex/ext during functional task with min-mod cueing/facilitation.       OT Education - 01/20/19 0953    Education Details  Recommended sitting on sofa for short periods to get out of w/c and work more on posture/trunk control;  Also recommened functional reaching forward and laterally for cup and instructed in how to emphasize normal movement patterns    Person(s) Educated  Patient;Child(ren)    Methods  Explanation;Demonstration    Comprehension  Verbalized understanding;Returned demonstration;Verbal cues required  OT Short Term Goals - 01/20/19 1131      OT SHORT TERM GOAL #1   Title  Independent with initial HEP for shoulder ROM, and functional reaching LUE - due 01/22/19    Time  4    Period  Weeks    Status  On-going      OT SHORT TERM GOAL #2   Title  Pt to perform UB dressing with set up only and LE dressing with mod assist or less using A/E prn    Time  4    Period  Weeks    Status  Achieved      OT SHORT TERM GOAL #3   Title  Pt to increase LUE functional use as evidenced by performing 20 blocks on Box & Blocks test    Baseline  11  blocks    Time  4    Period  Weeks    Status  On-going      OT SHORT TERM GOAL #4   Title  Pt to perform toileting/clothes management with only min assist    Period  Weeks    Status  On-going   Able to complete - simulation -  in clinic     OT SHORT TERM GOAL #5   Title  Pt to achieve 90* shoulder flexion LUE for mid level reaching    Time  4    Period  Weeks    Status  On-going   01/18/19:  75* with min compensation       OT Long Term Goals - 12/17/18 0925      OT LONG TERM GOAL #1   Title  Independent with updated HEP - due 02/16/19    Time  8    Period  Weeks    Status  New      OT LONG TERM GOAL #2   Title  Pt to be mod I with all BADLS using A/E as needed (except shower transfers to still be supervision)    Time  8    Period  Weeks    Status  New      OT LONG TERM GOAL #3   Title  Pt to improve LUE function as evidenced by performing 25 blocks or greater on Box & Blocks test    Time  8    Period  Weeks    Status  New      OT LONG TERM GOAL #4   Title  Pt to improve coordination as evidenced by performing 9 hole peg test completely in 90 sec or less    Baseline  placed 5 in 2 min.    Time  8    Period  Weeks    Status  New      OT LONG TERM GOAL #5   Title  Grip strength Lt hand to be 15 lbs or greater    Baseline  8 lbs    Time  8    Period  Weeks    Status  New      Long Term Additional Goals   Additional Long Term Goals  Yes      OT LONG TERM GOAL #6   Title  Pt to perform simple snack, cold food, and microwaveable food mod I level with DME prn without LOB    Time  8    Period  Weeks    Status  New      OT LONG TERM GOAL #7   Title  Pt to achieve 110* or greater P/ROM Lt shoulder with pain less than or equal to 5/10    Time  8    Period  Weeks    Status  New            Plan - 01/20/19 0950    Clinical Impression Statement  Pt demo improved posture/midline alignment today with cueing.    OT Frequency  2x / week    OT Duration  8  weeks    OT Treatment/Interventions  Self-care/ADL training;Therapeutic exercise;Functional Mobility Training;Neuromuscular education;Aquatic Therapy;Manual Therapy;Splinting;Therapeutic activities;Paraffin;DME and/or AE instruction;Cognitive remediation/compensation;Electrical Stimulation;Visual/perceptual remediation/compensation;Moist Heat;Passive range of motion;Patient/family education    Plan  LUE/trunk NMR and manual in pain free ranges (check goals next week due to decr frequency)    Consulted and Agree with Plan of Care  Patient;Family member/caregiver    Family Member Consulted  Daughter Unk Lightning)       Patient will benefit from skilled therapeutic intervention in order to improve the following deficits and impairments:           Visit Diagnosis: 1. Hemiplegia and hemiparesis following cerebral infarction affecting left non-dominant side ([place])   2. Unsteadiness on feet   3. Other lack of coordination   4. Other abnormalities of gait and mobility   5. Muscle weakness (generalized)   6. Stiffness of left shoulder, not elsewhere classified   7. Chronic left shoulder pain       Problem List Patient Active Problem List   Diagnosis Date Noted  . Lethargy 10/07/2018  . Pressure injury of skin 09/27/2018  . Pelvic fracture ([place]) 09/21/2018  . Allergic reaction caused by a drug 05/06/2018  . Bilateral leg edema 04/13/2018  . HLD (hyperlipidemia) 03/23/2018  . Mood swings 03/23/2018  . Chronic kidney disease (CKD), stage III (moderate) ([facility])   . Embolic stroke ([place]) 45/62/5638  . Left hemiparesis ([place])   . Diabetes mellitus type 2 in nonobese ([facility])   . Acute ischemic stroke ([place])   . Left arm weakness   . Acute CVA (cerebrovascular accident) ([place]) 02/24/2018  . 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 Overweight (BMI 25.0-29.9) 02/24/2018  . CKD (chronic kidney disease), stage III ([place]) 02/24/2018  . Diabetes mellitus without complication ([place])  93/73/4287  . HTN (hypertension) 09/02/2017  . Knee pain, bilateral 09/02/2017    [facility] 01/20/2019, 12:31 PM  [facility] [address] [place], [place], [address] Phone: [phone]   Fax:  [phone]  Name: LIYANA SUNIGA MRN: [id number] Date of Birth: [birth year]   Vianne Bulls, OTR/L [facility] [address] [place], [place]  [address] [phone] phone [phone] 01/20/19 12:31 PM

## 2019-01-20 NOTE — Therapy (Addendum)
[facility] [address] [place], [place], [address] Phone: [phone]   Fax:  [phone]  Physical Therapy Treatment  Patient Details  Name: [name] MRN: [id number] Date of Birth: Mar 21, 1946 Referring Provider (PT): Alysia Penna, MD  CLINIC OPERATION CHANGES: Outpatient Neuro Rehab is open at lower capacity following universal masking, social distancing, and patient screening.  The patient's COVID risk of complications score is 5.  Encounter Date: 01/20/2019   Progress Note Reporting Period 12/21/2018 to 01/20/2019  See note below for Objective Data and Assessment of Progress/Goals.   Rico Junker, PT, DPT 01/20/19    5:13 PM   PT End of Session - 01/20/19 0903    Visit Number  10    Number of Visits  21    Date for PT Re-Evaluation  02/19/19    Authorization Type  [employer] Medicare, 10th visit progress note    PT Start Time  0803    PT Stop Time  0846    PT Time Calculation (min)  43 min    Activity Tolerance  Patient tolerated treatment well;Patient limited by fatigue   Endurance improved today but was fatigued by end of session   Behavior During Therapy  [facility] for tasks assessed/performed       Past Medical History:  Diagnosis Date  . Allergy   . Arthritis   . Cataract   . Diabetes mellitus without complication ([place])   . Hypertension   . Macular degeneration syndrome    with edema---being treated.     Past Surgical History:  Procedure Laterality Date  . callous removal  Left 2017   located on 1st digit on L foot 2/2 diabetes  . EYE SURGERY      There were no vitals filed for this visit.  Subjective Assessment - 01/20/19 1223    Subjective  Denies any changes since last visit.  Feels like her endurance is better today.    Patient is accompained by:  Family member   daughter   Pertinent History  Patient had a R CVA 02/2018 and was walking with a SPC prior to a fall in March 2020  resulting in being hospitalized with multiple pelvic fxs and a L hip fx. During hospital stay, patient acquired pressure ulcers on bilateral soles of feet near heels.Patient is now safely able to bear weight and pressure ulcers are now a stage 1 (per daughter report and photos).    Patient Stated Goals  "Be back to normal, get back to walking, and getting strength back"    Currently in Pain?  No/denies          [facility] Adult PT Treatment/Exercise - 01/20/19 0001      Bed Mobility   Bed Mobility  Not assessed   Performed with OT in session prior.   Sit to Supine - Details (indicate cue type and reason)  Pt & dtr report pt needing min assist for bed mobility at home and is still using rail.  OT session focused on bed mobility today and pt needing min assist.      Transfers   Transfers  Sit to Stand;Stand to Sit;Stand Pivot Transfers    Sit to Stand  4: Min guard    Sit to Stand Details  Verbal cues for sequencing;Verbal cues for technique;Visual cues/gestures for sequencing    Stand to Sit  5: Supervision    Stand to Sit Details (indicate cue type and reason)  Verbal cues for  technique    Stand to Sit Details  cues to reach back for armrests    Stand Pivot Transfers  4: Min guard    Number of Reps  Other reps (comment)   multiple times during session today     Ambulation/Gait   Ambulation/Gait  Yes    Ambulation/Gait Assistance  4: Min assist;4: Min guard    Ambulation/Gait Assistance Details  Pt continues to have increased forward flexion, step to pattern and standing rest breaks.  Cues for safety with rollator concerning grip and locking/unlocking brakes.  Tends to have difficulty at time steering Rollator.    Ambulation Distance (Feet)  115 Feet   x 1 and 80' x 1   Assistive device  Rollator    Gait Pattern  Step-through pattern;Step-to pattern;Decreased step length - right;Decreased dorsiflexion - right;Decreased weight shift to left;Decreased hip/knee flexion - right;Poor foot  clearance - right    Ambulation Surface  Level;Indoor    Gait velocity  79 sec=.42 ft/sec with Rollator   Gait velocity performed at end of session   Gait Comments  Gait velocity was performed at end of session and with Rollator.  Original gait velocity was measure with RW.  Feel gait speed was more with first ambulation but did not measure at that time.      Posture/Postural Control   Posture/Postural Control  Postural limitations    Postural Limitations  Rounded Shoulders;Forward head;Increased thoracic kyphosis;Posterior pelvic tilt    Posture Comments  Patient has difficulty holding trunk erect >5 seconds during static standing and gait with rolling walker.      Standardized Balance Assessment   Standardized Balance Assessment  Berg Balance Test      Berg Balance Test   Sit to Stand  Able to stand using hands after several tries    Standing Unsupported  Able to stand 2 minutes with supervision    Sitting with Back Unsupported but Feet Supported on Floor or Stool  Able to sit safely and securely 2 minutes    Stand to Sit  Controls descent by using hands    Transfers  Able to transfer with verbal cueing and /or supervision    Standing Unsupported with Eyes Closed  Able to stand 10 seconds with supervision    Standing Ubsupported with Feet Together  Needs help to attain position and unable to hold for 15 seconds    From Standing, Reach Forward with Outstretched Arm  Can reach forward >5 cm safely (2")    From Standing Position, Pick up Object from Floor  Unable to try/needs assist to keep balance    From Standing Position, Turn to Look Behind Over each Shoulder  Needs supervision when turning    Turn 360 Degrees  Needs assistance while turning    Standing Unsupported, Alternately Place Feet on Step/Stool  Needs assistance to keep from falling or unable to try    Standing Unsupported, One Foot in Front  Needs help to step but can hold 15 seconds    Standing on One Leg  Unable to try or  needs assist to prevent fall    Total Score  21      Exercises   Exercises Verbally reviewed HEP.  Pt reports she has not been performing consistently.  Will review next session.       PT Education - 01/20/19 0902    Education Details  progress toward goals and scores from todays session, HEP to be reviewed next session  Person(s) Educated  Patient;Child(ren)    Methods  Explanation    Comprehension  Verbalized understanding       PT Short Term Goals - 01/20/19 1228      PT SHORT TERM GOAL #1   Title  Patient will be independent with initial HEP focusing on bilateral LE strength and balance to build upon gains made in therapy.    Time  4    Period  Weeks    Status  New    Target Date  01/20/19      PT SHORT TERM GOAL #2   Title  Patient will increase BERG score to at least a 20/56 in order to help decrease risk of future falls.    Baseline  21/56 on 01/20/19    Time  4    Period  Weeks    Status  Achieved    Target Date  01/20/19      PT SHORT TERM GOAL #3   Title  Patient will perform sit <> stand with min A from therapist and no UE support in order to help decrease caregiver burden at home during transfers.    Baseline  Min assist-supervision with no UE assist    Time  4    Period  Weeks    Status  Achieved    Target Date  01/20/19      PT SHORT TERM GOAL #4   Title  Patient will ambulate 100 ft with LRAD and CGA from therapist in order to improve household functional mobility.    Baseline  100 ft with rollator with min assist-01/20/19    Time  4    Period  Weeks    Status  Partially Met    Target Date  01/20/19      PT SHORT TERM GOAL #5   Title  Patient will participate in further assessment of gait speed.    Baseline  .71 ft/sec    Time  4    Period  Weeks    Status  Achieved    Target Date  01/20/19      PT SHORT TERM GOAL #6   Title  Patient will perform all bed mobility with supervision in order to decrease caregiver burden and increase independence.     Baseline  Pt still requiring min assist    Time  4    Period  Weeks    Status  Not Met    Target Date  01/20/19        PT Long Term Goals - 01/01/19 1259      PT LONG TERM GOAL #1   Title  Patient will be independent with final HEP in order to continue building upon functional gains made in therapy.  (DUE 02/19/19)    Time  8    Period  Weeks    Status  New      PT LONG TERM GOAL #2   Title  Patient will ambulate at least 150 feet with LRAD and supervision in order to improve functional mobility in the household and community.    Baseline  45 feet with RW, mod A from therapist    Time  8    Period  Weeks    Status  New      PT LONG TERM GOAL #3   Title  Patient will increase BERG score to at least 35/56 in order to decrease risk of future falls.    Baseline  11/56 on 12/21/18  Time  8    Period  Weeks      PT LONG TERM GOAL #4   Title  Patient will perform functional transfers and sit <> stands with mod I in order to decrease caregiver burden.    Baseline  Mod A with stand step transfers and sit <> stand from low surface    Time  8    Period  Weeks    Status  New      PT LONG TERM GOAL #5   Title  Patient will increase gait speed to at least 1.8 ft/sec in order to decrease risk of falls and improve household ambulation.    Baseline  .57 ft/sec with RW    Time  8    Period  Weeks    Status  Revised      PT LONG TERM GOAL #6   Title  Patient will ascend/descend 3 steps using LRAD with supervision in order to enter/exit home without use of a ramp.    Time  8    Period  Weeks    Status  New            Plan - 01/20/19 1236    Clinical Impression Statement  Pt met STG's 2,3 and 5.  STG 6 not met as pt requires min assist for bed mobility.  STG 4 partially met for distance but not assist level.  STG 1 to be checked on next session.  Pt continues to have fatigue with sessions, especially this week.  Continue PT per POC.    Personal Factors and Comorbidities   Past/Current Experience;Time since onset of injury/illness/exacerbation;Comorbidity 3+    Comorbidities  Hypertension, Diabetes, Chronic kidney disease    Examination-Activity Limitations  Locomotion Level;Transfers;Stand;Squat    Examination-Participation Restrictions  Community Activity    Stability/Clinical Decision Making  Evolving/Moderate complexity    Rehab Potential  Good    PT Frequency  Other (comment)   3x week/4 weeks, 2x week/4 weeks   PT Duration  8 weeks    PT Treatment/Interventions  ADLs/Self Care Home Management;Gait training;DME Instruction;Stair training;Functional mobility training;Therapeutic activities;Patient/family education;Neuromuscular re-education;Balance training;Therapeutic exercise;Passive range of motion    PT Next Visit Plan  Check STG#1; Walking to/from car with rollator; to/from waiting area with rollator, endurance on Nustep, bed mobility, lateral/diagnonal weight shifting and stepping backwards and side to side, postural strengthening/overhead activies on R, LE strengthening (sit <> stands w/ focus on anterior pelvic tilt), LE extensor strengthening    Consulted and Agree with Plan of Care  Patient;Family member/caregiver    Family Member Consulted  Daughter, [name]       Patient will benefit from skilled therapeutic intervention in order to improve the following deficits and impairments:  Abnormal gait, Decreased activity tolerance, Decreased balance, Decreased range of motion, Decreased mobility, Decreased endurance, Decreased skin integrity, Decreased strength, Difficulty walking, Impaired perceived functional ability, Postural dysfunction  Visit Diagnosis: 1. Unsteadiness on feet   2. Muscle weakness (generalized)   3. Other abnormalities of gait and mobility   4. Hemiplegia and hemiparesis following cerebral infarction affecting left non-dominant side [facility])        Problem List Patient Active Problem List   Diagnosis Date Noted  . Lethargy  10/07/2018  . Pressure injury of skin 09/27/2018  . Pelvic fracture ([place]) 09/21/2018  . Allergic reaction caused by a drug 05/06/2018  . Bilateral leg edema 04/13/2018  . HLD (hyperlipidemia) 03/23/2018  . Mood swings 03/23/2018  . Chronic kidney disease (  CKD), stage III (moderate) ([place])   . Embolic stroke ([place]) 69/22/3009  . Left hemiparesis ([place])   . Diabetes mellitus type 2 in nonobese ([facility])   . Acute ischemic stroke ([place])   . Left arm weakness   . Acute CVA (cerebrovascular accident) ([place]) 02/24/2018  . Macular edema due to secondary diabetes ([place]) 02/24/2018  . Overweight (BMI 25.0-29.9) 02/24/2018  . CKD (chronic kidney disease), stage III ([place]) 02/24/2018  . Diabetes mellitus without complication ([place]) 79/49/9718  . HTN (hypertension) 09/02/2017  . Knee pain, bilateral 09/02/2017    Narda Bonds, PTA [facility] 01/20/19 12:43 PM Phone: [phone] Fax: [facility] [place] [address] [place], [place], [address] Phone: [phone]   Fax:  [phone]  Name: [name] MRN: [id number] Date of Birth: Dec 01, 1945

## 2019-01-22 ENCOUNTER — Ambulatory Visit: Payer: Medicare Other | Admitting: Physical Therapy

## 2019-01-22 ENCOUNTER — Encounter

## 2019-01-22 ENCOUNTER — Other Ambulatory Visit: Payer: Self-pay

## 2019-01-22 ENCOUNTER — Encounter: Payer: Self-pay | Admitting: Physical Therapy

## 2019-01-22 DIAGNOSIS — R2681 Unsteadiness on feet: Secondary | ICD-10-CM

## 2019-01-22 DIAGNOSIS — I69354 Hemiplegia and hemiparesis following cerebral infarction affecting left non-dominant side: Secondary | ICD-10-CM

## 2019-01-22 DIAGNOSIS — M6281 Muscle weakness (generalized): Secondary | ICD-10-CM

## 2019-01-22 DIAGNOSIS — R2689 Other abnormalities of gait and mobility: Secondary | ICD-10-CM

## 2019-01-22 NOTE — Therapy (Signed)
[facility] [address] [place], [place], [address] Phone: [phone]   Fax:  [phone]  Physical Therapy Treatment  Patient Details  Name: [name] MRN: [id number] Date of Birth: [birth year] Referring Provider (PT): Alysia Penna, MD   Encounter Date: 01/22/2019   CLINIC OPERATION CHANGES: Outpatient Neuro Rehab is open at lower capacity following universal masking, social distancing, and patient screening.  The patient's COVID risk of complications score is 5.   PT End of Session - 01/22/19 0902    Visit Number  11    Number of Visits  21    Date for PT Re-Evaluation  02/19/19    Authorization Type  [employer] Medicare, 10th visit progress note    PT Start Time  0801    PT Stop Time  0850    PT Time Calculation (min)  49 min    Activity Tolerance  Patient tolerated treatment well;Patient limited by fatigue   Endurance improved today but was fatigued by end of session   Behavior During Therapy  [facility] for tasks assessed/performed       Past Medical History:  Diagnosis Date  . Allergy   . Arthritis   . Cataract   . Diabetes mellitus without complication ([place])   . Hypertension   . Macular degeneration syndrome    with edema---being treated.     Past Surgical History:  Procedure Laterality Date  . callous removal  Left 2017   located on 1st digit on L foot 2/2 diabetes  . EYE SURGERY      There were no vitals filed for this visit.  Subjective Assessment - 01/22/19 0805    Patient is accompained by:  Family member   daughter   Pertinent History  Patient had a R CVA 02/2018 and was walking with a SPC prior to a fall in March 2020 resulting in being hospitalized with multiple pelvic fxs and a L hip fx. During hospital stay, patient acquired pressure ulcers on bilateral soles of feet near heels.Patient is now safely able to bear weight and pressure ulcers are now a stage 1 (per daughter report and  photos).    Patient Stated Goals  "Be back to normal, get back to walking, and getting strength back"         [facility] PT Assessment - 01/22/19 0807      Standardized Balance Assessment   10 Meter Walk  1.07 minute or .48 ft/sec           [facility] Adult PT Treatment/Exercise - 01/22/19 0824      Bed Mobility   Bed Mobility  Rolling Left;Left Sidelying to Sit;Sit to Supine    Rolling Left  Supervision/Verbal cueing    Left Sidelying to Sit  Contact Guard/Touching assist    Supine to Sit  Contact Guard/Touching assist    Sit to Supine  Contact Guard/Touching assist    Sit to Supine - Details (indicate cue type and reason)  With chair arm rest simulating bed rail pt able to perform sit <> supine with light min A for safety and verbal cues for sequencing.        Transfers   Transfers  Sit to Stand;Stand to [employer] Transfers    Sit to Stand  5: Supervision    Sit to Stand Details (indicate cue type and reason)  with improved forward weight shift over BOS    Stand to Sit  5: Supervision    Stand to Sit  Details  min cues to reach back for arm rests    Stand Pivot Transfers  4: Min guard    Stand Pivot Transfer Details (indicate cue type and reason)  with RW, min A to assist with pivoting RW and verbal cues for sequence      Knee/Hip Exercises: Stretches   Gastroc Stretch  Left;2 reps;10 seconds    Gastroc Stretch Limitations  standing at counter, foot on small step      Knee/Hip Exercises: Standing   Other Standing Knee Exercises  Standing with feet staggered, L foot forwards and L hand on wash cloth on counter top performing weight shifts forwards and to the L pushing wash cloth forwards with cues for more upright posture through hip extension; noted to have decreased range in L ankle for DF and hip extension      Knee/Hip Exercises: Supine   Bridges with Clamshell  Strengthening;Both;1 set;5 reps   red theraband around LE     Knee/Hip Exercises: Sidelying   Other Sidelying  Knee/Hip Exercises  Lower trunk rotations with slight hip flexion x 5 reps to each side        Access Code: [email or web address]  URL: [email or web address]  Date: 01/22/2019  Prepared by: Misty Stanley   Exercises Sit to Stand with Counter Support - 10 reps - 1 sets - 1x daily - 5x weekly Standing March with Counter Support - 10 reps - 1 sets - 1x daily - 5x weekly Mini Squat with Counter Support - 10 reps - 1 sets - 1x daily - 5x weekly Supine Bridge with Resistance Band - 10 reps - 1 sets - 1x daily - 5x weekly 90/90 Lower Trunk Rotation - 10 reps - 2 sets - 1x daily - 7x weekly Standing Gastroc Stretch on Step with Counter Support - 4 sets - 30 seconds hold - 1x daily - 7x weekly      PT Education - 01/22/19 0857    Education Details  updated HEP    Person(s) Educated  Patient;Child(ren)    Methods  Explanation;Demonstration;Handout    Comprehension  Verbalized understanding;Returned demonstration       PT Short Term Goals - 01/22/19 0905      PT SHORT TERM GOAL #1   Title  Patient will be independent with initial HEP focusing on bilateral LE strength and balance to build upon gains made in therapy.    Time  4    Period  Weeks    Status  Achieved    Target Date  01/20/19      PT SHORT TERM GOAL #2   Title  Patient will increase BERG score to at least a 20/56 in order to help decrease risk of future falls.    Baseline  21/56 on 01/20/19    Time  4    Period  Weeks    Status  Achieved    Target Date  01/20/19      PT SHORT TERM GOAL #3   Title  Patient will perform sit <> stand with min A from therapist and no UE support in order to help decrease caregiver burden at home during transfers.    Baseline  Min assist-supervision with no UE assist    Time  4    Period  Weeks    Status  Achieved    Target Date  01/20/19      PT SHORT TERM GOAL #4   Title  Patient will ambulate 100 ft with LRAD and CGA  from therapist in order to improve household functional mobility.     Baseline  100 ft with rollator with min assist-01/20/19    Time  4    Period  Weeks    Status  Partially Met    Target Date  01/20/19      PT SHORT TERM GOAL #5   Title  Patient will participate in further assessment of gait speed.    Baseline  .47 ft/sec    Time  4    Period  Weeks    Status  Achieved    Target Date  01/20/19      PT SHORT TERM GOAL #6   Title  Patient will perform all bed mobility with supervision in order to decrease caregiver burden and increase independence.    Baseline  Pt still requiring min assist    Time  4    Period  Weeks    Status  Not Met    Target Date  01/20/19        PT Long Term Goals - 01/22/19 0905      PT LONG TERM GOAL #1   Title  Patient will be independent with final HEP in order to continue building upon functional gains made in therapy.  (DUE 02/19/19)    Time  8    Period  Weeks    Status  New      PT LONG TERM GOAL #2   Title  Patient will ambulate at least 150 feet with LRAD and supervision in order to improve functional mobility in the household and community.    Baseline  115' with min A from therapist with rollator    Time  8    Period  Weeks    Status  Revised      PT LONG TERM GOAL #3   Title  Patient will increase BERG score to at least 26/56 in order to decrease risk of future falls.    Baseline  21/56    Time  8    Period  Weeks    Status  Revised      PT LONG TERM GOAL #4   Title  Patient will perform functional transfers and sit <> stands with mod I in order to decrease caregiver burden.    Baseline  supervision-min A sit <> stand and stand pivot    Time  8    Period  Weeks    Status  Revised      PT LONG TERM GOAL #5   Title  Patient will increase gait speed to at least 1.0 ft/sec with LRAD in order to decrease risk of falls and improve household ambulation.    Baseline  .47 ft/sec with RW and rollator    Time  8    Period  Weeks    Status  Revised      PT LONG TERM GOAL #6   Title  Patient will  ascend/descend 3 steps using LRAD with supervision in order to enter/exit home without use of a ramp.    Time  8    Period  Weeks    Status  New            Plan - 01/22/19 0902    Clinical Impression Statement  Performed reassessment of gait velocity with RW at beginning of session with no change in gait velocity but pt is is demonstrating improved weight shifting and gait mechanics overall.  Rest of session focused on review and upgrading   of HEP in supine and standing at counter.  Pt tolerated well.  Will continue to address in order to progress towards LTG.    Personal Factors and Comorbidities  Past/Current Experience;Time since onset of injury/illness/exacerbation;Comorbidity 3+    Comorbidities  Hypertension, Diabetes, Chronic kidney disease    Examination-Activity Limitations  Locomotion Level;Transfers;Stand;Squat    Examination-Participation Restrictions  Community Activity    Stability/Clinical Decision Making  Evolving/Moderate complexity    Rehab Potential  Good    PT Frequency  Other (comment)   3x week/4 weeks, 2x week/4 weeks   PT Duration  8 weeks    PT Treatment/Interventions  ADLs/Self Care Home Management;Gait training;DME Instruction;Stair training;Functional mobility training;Therapeutic activities;Patient/family education;Neuromuscular re-education;Balance training;Therapeutic exercise;Passive range of motion    PT Next Visit Plan  Stretching gastroc and hip flexors at counter with weight shifting.  Walking to/from car with rollator; to/from waiting area with rollator, endurance on Nustep, lateral/diagnonal weight shifting and stepping backwards and side to side, postural strengthening/overhead activies on R, LE strengthening (sit <> stands w/ focus on anterior pelvic tilt), LE extensor strengthening    Consulted and Agree with Plan of Care  Patient;Family member/caregiver    Family Member Consulted  Daughter, [name]       Patient will benefit from skilled  therapeutic intervention in order to improve the following deficits and impairments:  Abnormal gait, Decreased activity tolerance, Decreased balance, Decreased range of motion, Decreased mobility, Decreased endurance, Decreased skin integrity, Decreased strength, Difficulty walking, Impaired perceived functional ability, Postural dysfunction  Visit Diagnosis: 1. Unsteadiness on feet   2. Muscle weakness (generalized)   3. Other abnormalities of gait and mobility   4. Hemiplegia and hemiparesis following cerebral infarction affecting left non-dominant side (HCC)        Problem List Patient Active Problem List   Diagnosis Date Noted  . Lethargy 10/07/2018  . Pressure injury of skin 09/27/2018  . Pelvic fracture (HCC) 09/21/2018  . Allergic reaction caused by a drug 05/06/2018  . Bilateral leg edema 04/13/2018  . HLD (hyperlipidemia) 03/23/2018  . Mood swings 03/23/2018  . Chronic kidney disease (CKD), stage III (moderate) (HCC)   . Embolic stroke (HCC) 02/27/2018  . Left hemiparesis (HCC)   . Diabetes mellitus type 2 in nonobese (HCC)   . Acute ischemic stroke (HCC)   . Left arm weakness   . Acute CVA (cerebrovascular accident) (HCC) 02/24/2018  . Macular edema due to secondary diabetes (HCC) 02/24/2018  . Overweight (BMI 25.0-29.9) 02/24/2018  . CKD (chronic kidney disease), stage III (HCC) 02/24/2018  . Diabetes mellitus without complication (HCC) 09/02/2017  . HTN (hypertension) 09/02/2017  . Knee pain, bilateral 09/02/2017    Audra F Potter, PT, DPT 01/22/19    9:08 AM    [facility] [address] [place], , [address] Phone: [phone]   Fax:  [phone]  Name: Tari L Schaller MRN: [id number] Date of Birth: 02/01/1946   

## 2019-01-22 NOTE — Patient Instructions (Addendum)
Access Code: [email or web address]  URL: [email or web address]  Date: 01/22/2019  Prepared by: Misty Stanley   Exercises Sit to Stand with Counter Support - 10 reps - 1 sets - 1x daily - 5x weekly Standing March with Counter Support - 10 reps - 1 sets - 1x daily - 5x weekly Mini Squat with Counter Support - 10 reps - 1 sets - 1x daily - 5x weekly Supine Bridge with Resistance Band - 10 reps - 1 sets - 1x daily - 5x weekly 90/90 Lower Trunk Rotation - 10 reps - 2 sets - 1x daily - 7x weekly Standing Gastroc Stretch on Step with Counter Support - 4 sets - 30 seconds hold - 1x daily - 7x weekly

## 2019-01-25 ENCOUNTER — Ambulatory Visit: Payer: Medicare Other

## 2019-01-25 ENCOUNTER — Other Ambulatory Visit: Payer: Self-pay

## 2019-01-25 VITALS — BP 157/59 | HR 78

## 2019-01-25 DIAGNOSIS — I69354 Hemiplegia and hemiparesis following cerebral infarction affecting left non-dominant side: Secondary | ICD-10-CM | POA: Diagnosis not present

## 2019-01-25 DIAGNOSIS — R278 Other lack of coordination: Secondary | ICD-10-CM

## 2019-01-25 DIAGNOSIS — R2689 Other abnormalities of gait and mobility: Secondary | ICD-10-CM

## 2019-01-25 DIAGNOSIS — R2681 Unsteadiness on feet: Secondary | ICD-10-CM

## 2019-01-25 NOTE — Therapy (Signed)
[facility] [address] [place], [place], [address] Phone: [phone]   Fax:  [phone]  Physical Therapy Treatment  Patient Details  Name: [name] MRN: [id number] Date of Birth: 1945-12-06 Referring Provider (PT): Alysia Penna, MD  CLINIC OPERATION CHANGES: Outpatient Neuro Rehab is open at lower capacity following universal masking, social distancing, and patient screening.   Encounter Date: 01/25/2019  PT End of Session - 01/25/19 1056    Visit Number  12    Number of Visits  21    Date for PT Re-Evaluation  02/19/19    Authorization Type  [employer] Medicare, 10th visit progress note    PT Start Time  1002    PT Stop Time  1045    PT Time Calculation (min)  43 min    Equipment Utilized During Treatment  --   min-mod A   Activity Tolerance  Patient tolerated treatment well;Patient limited by fatigue    Behavior During Therapy  WFL for tasks assessed/performed       Past Medical History:  Diagnosis Date  . Allergy   . Arthritis   . Cataract   . Diabetes mellitus without complication ([place])   . Hypertension   . Macular degeneration syndrome    with edema---being treated.     Past Surgical History:  Procedure Laterality Date  . callous removal  Left 2017   located on 1st digit on L foot 2/2 diabetes  . EYE SURGERY      Vitals:   01/25/19 1023  BP: (!) 157/59  Pulse: 78  after gait and 164/30mHg and HR: 74bpm after seated rest break.  Subjective Assessment - 01/25/19 1013    Subjective  Pt denied falls or changes since last visit. Pt presented to lobby in manual w/c with dtr.    Patient is accompained by:  Family member   [name]-dtr   Pertinent History  Patient had a R CVA 02/2018 and was walking with a SPC prior to a fall in March 2020 resulting in being hospitalized with multiple pelvic fxs and a L hip fx. During hospital stay, patient acquired pressure ulcers on bilateral soles of feet  near heels.Patient is now safely able to bear weight and pressure ulcers are now a stage 1 (per daughter report and photos).    Patient Stated Goals  "Be back to normal, get back to walking, and getting strength back"    Currently in Pain?  No/denies                       [facility] - 01/25/19 1022      Transfers   Transfers  Sit to Stand;Stand to [employer]   Sit to Stand  5: Supervision    Sit to Stand Details (indicate cue type and reason)  Cues to lock rollator brakes during STS txfs. Cues to scoot towards edge of chair/mat and to improve ant. weight shift and lateral weight shift onto LLE.     Stand to Sit  5: Supervision    Stand to Sit Details  Cues to improve eccentric control, hand placement, lock rollator brakes and for sequencing with rollator.       Ambulation/Gait   Ambulation/Gait  Yes    Ambulation/Gait Assistance  4: Min assist;3: Mod assist    Ambulation/Gait Assistance Details  Pt continues to require cues to improve heel strike, R step length/stride length and to improve upright  posture as pt with difficulty keeping trunk erect. Pt required rest breaks as gait deviations (poor heel strike, L toe clearance, and trunk flexion) all incr. with fatigue. PT provided manual facilitation at B hips to improve lateral weight shifting onto LLE and ant. weight shifting during mid-late stance.    Ambulation Distance (Feet)  75 Feet   and 124', 90' indoors and 135' over paved outdoor surfaces   Assistive device  Rollator    Gait Pattern  Step-through pattern;Step-to pattern;Decreased step length - right;Decreased dorsiflexion - right;Decreased weight shift to left;Decreased hip/knee flexion - right;Poor foot clearance - right    Ambulation Surface  Level;Unlevel             PT Education - 01/25/19 1055    Education Details  PT educated pt on using RW at home 2/2 safety. PT encouraged pt to amb. from car to lobby next session  with dtr's assistance.    Person(s) Educated  Patient;Child(ren)    Methods  Explanation       PT Short Term Goals - 01/22/19 0905      PT SHORT TERM GOAL #1   Title  Patient will be independent with initial HEP focusing on bilateral LE strength and balance to build upon gains made in therapy.    Time  4    Period  Weeks    Status  Achieved    Target Date  01/20/19      PT SHORT TERM GOAL #2   Title  Patient will increase BERG score to at least a 20/56 in order to help decrease risk of future falls.    Baseline  21/56 on 01/20/19    Time  4    Period  Weeks    Status  Achieved    Target Date  01/20/19      PT SHORT TERM GOAL #3   Title  Patient will perform sit <> stand with min A from therapist and no UE support in order to help decrease caregiver burden at home during transfers.    Baseline  Min assist-supervision with no UE assist    Time  4    Period  Weeks    Status  Achieved    Target Date  01/20/19      PT SHORT TERM GOAL #4   Title  Patient will ambulate 100 ft with LRAD and CGA from therapist in order to improve household functional mobility.    Baseline  100 ft with rollator with min assist-01/20/19    Time  4    Period  Weeks    Status  Partially Met    Target Date  01/20/19      PT SHORT TERM GOAL #5   Title  Patient will participate in further assessment of gait speed.    Baseline  .28 ft/sec    Time  4    Period  Weeks    Status  Achieved    Target Date  01/20/19      PT SHORT TERM GOAL #6   Title  Patient will perform all bed mobility with supervision in order to decrease caregiver burden and increase independence.    Baseline  Pt still requiring min assist    Time  4    Period  Weeks    Status  Not Met    Target Date  01/20/19        PT Long Term Goals - 01/22/19 0905      PT LONG  TERM GOAL #1   Title  Patient will be independent with final HEP in order to continue building upon functional gains made in therapy.  (DUE 02/19/19)    Time  8     Period  Weeks    Status  New      PT LONG TERM GOAL #2   Title  Patient will ambulate at least 150 feet with LRAD and supervision in order to improve functional mobility in the household and community.    Baseline  115' with min A from therapist with rollator    Time  8    Period  Weeks    Status  Revised      PT LONG TERM GOAL #3   Title  Patient will increase BERG score to at least 26/56 in order to decrease risk of future falls.    Baseline  21/56    Time  8    Period  Weeks    Status  Revised      PT LONG TERM GOAL #4   Title  Patient will perform functional transfers and sit <> stands with mod I in order to decrease caregiver burden.    Baseline  supervision-min A sit <> stand and stand pivot    Time  8    Period  Weeks    Status  Revised      PT LONG TERM GOAL #5   Title  Patient will increase gait speed to at least 1.0 ft/sec with LRAD in order to decrease risk of falls and improve household ambulation.    Baseline  .68 ft/sec with RW and rollator    Time  8    Period  Weeks    Status  Revised      PT LONG TERM GOAL #6   Title  Patient will ascend/descend 3 steps using LRAD with supervision in order to enter/exit home without use of a ramp.    Time  8    Period  Weeks    Status  New            Plan - 01/25/19 1057    Clinical Impression Statement  Pt demonstrated progress, as she was able to progress to amb. with rollator over even (indoor) surfaces with min A. Pt did require three seated rest breaks during gait training 2/2 fatigue, which incr. gait devations-decr. heel strike, decr. weight shifting onto LLE, decr. R step length and incr. trunk flexion. Pt's systolic BP was slightly elevated during session but pt denied weakness, N/T, pain, confusion. PT will continue to monitor. Pt would continue to benefit from skilled PT to improve safety during functional mobility.    Personal Factors and Comorbidities  Past/Current Experience;Time since onset of  injury/illness/exacerbation;Comorbidity 3+    Comorbidities  Hypertension, Diabetes, Chronic kidney disease    Examination-Activity Limitations  Locomotion Level;Transfers;Stand;Squat    Examination-Participation Restrictions  Community Activity    Stability/Clinical Decision Making  Evolving/Moderate complexity    Rehab Potential  Good    PT Frequency  Other (comment)   3x week/4 weeks, 2x week/4 weeks   PT Duration  8 weeks    PT Treatment/Interventions  ADLs/Self Care Home Management;Gait training;DME Instruction;Stair training;Functional mobility training;Therapeutic activities;Patient/family education;Neuromuscular re-education;Balance training;Therapeutic exercise;Passive range of motion    PT Next Visit Plan  Continue Stretching gastroc and hip flexors at counter with weight shifting.  Walking to/from car with rollator; to/from waiting area with rollator, endurance on Nustep, lateral/diagnonal weight shifting and stepping backwards and  side to side, postural strengthening/overhead activies on R, LE strengthening (sit <> stands w/ focus on anterior pelvic tilt), LE extensor strengthening    Consulted and Agree with Plan of Care  Patient;Family member/caregiver    Family Member Consulted  Daughter, [name]       Patient will benefit from skilled therapeutic intervention in order to improve the following deficits and impairments:  Abnormal gait, Decreased activity tolerance, Decreased balance, Decreased range of motion, Decreased mobility, Decreased endurance, Decreased skin integrity, Decreased strength, Difficulty walking, Impaired perceived functional ability, Postural dysfunction  Visit Diagnosis: 1. Other abnormalities of gait and mobility   2. Hemiplegia and hemiparesis following cerebral infarction affecting left non-dominant side (HCC)   3. Other lack of coordination   4. Unsteadiness on feet        Problem List Patient Active Problem List   Diagnosis Date Noted  . Lethargy  10/07/2018  . Pressure injury of skin 09/27/2018  . Pelvic fracture ([place]) 09/21/2018  . Allergic reaction caused by a drug 05/06/2018  . Bilateral leg edema 04/13/2018  . HLD (hyperlipidemia) 03/23/2018  . Mood swings 03/23/2018  . Chronic kidney disease (CKD), stage III (moderate) (HCC)   . Embolic stroke ([place]) 15/83/0940  . Left hemiparesis ([place])   . Diabetes mellitus type 2 in nonobese (HCC)   . Acute ischemic stroke ([place])   . Left arm weakness   . Acute CVA (cerebrovascular accident) ([place]) 02/24/2018  . Macular edema due to secondary diabetes ([place]) 02/24/2018  . Overweight (BMI 25.0-29.9) 02/24/2018  . CKD (chronic kidney disease), stage III ([place]) 02/24/2018  . Diabetes mellitus without complication ([place]) 76/80/8811  . HTN (hypertension) 09/02/2017  . Knee pain, bilateral 09/02/2017    Tarrell Debes L 01/25/2019, 11:00 AM  [facility] [address] [place], [place], [address] Phone: [phone]   Fax:  [phone]  Name: [name] MRN: [id number] Date of Birth: 1945/10/17  Geoffry Paradise, PT,DPT 01/25/19 11:01 AM Phone: [phone] Fax: [phone]

## 2019-01-27 ENCOUNTER — Encounter: Payer: Self-pay | Admitting: Rehabilitative and Restorative Service Providers"

## 2019-01-27 ENCOUNTER — Ambulatory Visit: Payer: Medicare Other | Admitting: Occupational Therapy

## 2019-01-27 ENCOUNTER — Encounter: Payer: Self-pay | Admitting: Occupational Therapy

## 2019-01-27 ENCOUNTER — Other Ambulatory Visit: Payer: Self-pay

## 2019-01-27 ENCOUNTER — Ambulatory Visit: Payer: Medicare Other | Admitting: Rehabilitative and Restorative Service Providers"

## 2019-01-27 DIAGNOSIS — R2689 Other abnormalities of gait and mobility: Secondary | ICD-10-CM

## 2019-01-27 DIAGNOSIS — M6281 Muscle weakness (generalized): Secondary | ICD-10-CM

## 2019-01-27 DIAGNOSIS — I69354 Hemiplegia and hemiparesis following cerebral infarction affecting left non-dominant side: Secondary | ICD-10-CM | POA: Diagnosis not present

## 2019-01-27 DIAGNOSIS — R278 Other lack of coordination: Secondary | ICD-10-CM

## 2019-01-27 DIAGNOSIS — G8929 Other chronic pain: Secondary | ICD-10-CM

## 2019-01-27 DIAGNOSIS — R2681 Unsteadiness on feet: Secondary | ICD-10-CM

## 2019-01-27 DIAGNOSIS — M25612 Stiffness of left shoulder, not elsewhere classified: Secondary | ICD-10-CM

## 2019-01-27 NOTE — Patient Instructions (Signed)
Basic Activities:   Use your affected hand to perform the following activities.  Stop activity if you experience pain.  - Wipe table top - Bring plastic cup to mouth, then return to table top and release (put a small amount of water in it) - Open/close cabinet door with handle - Fold towels - Put empty clothes hangers on a rack, remove, and repeat - use left hand to wash tops of legs, stomach - hold bottles in left hand and open with right - in wheelchair, sweep or dust  - try to pick up finger foods with left hand

## 2019-01-27 NOTE — Therapy (Signed)
[facility] [address] [place], [place], [address] Phone: [phone]   Fax:  [phone]  Physical Therapy Treatment  Patient Details  Name: [name] MRN: [id number] Date of Birth: 12/15/1945 Referring Provider (PT): Alysia Penna, MD  CLINIC OPERATION CHANGES: Outpatient Neuro Rehab is open at lower capacity following universal masking, social distancing, and patient screening.  The patient's COVID risk of complications score is 5.  Encounter Date: 01/27/2019  PT End of Session - 01/27/19 0850    Visit Number  13    Number of Visits  21    Date for PT Re-Evaluation  02/19/19    Authorization Type  [employer] Medicare, 10th visit progress note    PT Start Time  0803    PT Stop Time  0848    PT Time Calculation (min)  45 min    Equipment Utilized During Treatment  --   Requested no gait belt due to it creating a rash in the past   Activity Tolerance  Patient tolerated treatment well;Patient limited by fatigue    Behavior During Therapy  WFL for tasks assessed/performed       Past Medical History:  Diagnosis Date  . Allergy   . Arthritis   . Cataract   . Diabetes mellitus without complication ([place])   . Hypertension   . Macular degeneration syndrome    with edema---being treated.     Past Surgical History:  Procedure Laterality Date  . callous removal  Left 2017   located on 1st digit on L foot 2/2 diabetes  . EYE SURGERY      There were no vitals filed for this visit.  Subjective Assessment - 01/27/19 0805    Subjective  The patient reports she didn't sleep well last night b/c of the early appointment today.  She arrived in her w/c and didn't bring rollator b/c of early visit.    Patient is accompained by:  Family member    Pertinent History  Patient had a R CVA 02/2018 and was walking with a SPC prior to a fall in March 2020 resulting in being hospitalized with multiple pelvic fxs and a L hip fx.  During hospital stay, patient acquired pressure ulcers on bilateral soles of feet near heels.Patient is now safely able to bear weight and pressure ulcers are now a stage 1 (per daughter report and photos).    Patient Stated Goals  "Be back to normal, get back to walking, and getting strength back"    Currently in Pain?  No/denies                       [facility] Adult PT Treatment/Exercise - 01/27/19 0851      Ambulation/Gait   Ambulation/Gait  Yes    Ambulation/Gait Assistance  4: Min assist    Ambulation/Gait Assistance Details  PT provided cues for patient to maintain lengthening through L hip as she advances R LE during gait, provided demo and verbal cues for longer stride + focus on heel strike.    Ambulation Distance (Feet)  115 Feet    Assistive device  Rollator    Gait Pattern  Step-through pattern;Decreased step length - right;Decreased dorsiflexion - right;Decreased weight shift to left;Decreased hip/knee flexion - right;Poor foot clearance - right    Ambulation Surface  Level;Indoor      Neuro Re-ed    Neuro Re-ed Details   Standing weight shifting activities emphasizing upright posture through hips  and ant/post weight shift with feet positioned in stride (did this with R UE on wall sliding up higher while looking at hand and shifting weight).  Modified weight shifting activity due to L shoulder dec'd ROM and discomfort to be in stride with L foot forward facing therapy mat with L UE supported on physioball and shifting forward.  The patient c/o instability through L hip in this activity (possibly due to less stability through physioball versus wall).  Performed standing anterior weight shift doing wall bumps with CGA and demo cues; standing in parallel bars working on anterior weight shift to reach target with hps (theraband positioned anteriorly to patient in bars).   Performed forward/backwards walking in the parallel bars emphasizing visual cues with mirror with min A and  cues at hips for activation.      Exercises   Exercises  Other Exercises    Other Exercises   Modified thomas test stretch (*challenging and painful for patient)/ PT modified to hold patient's leg and support it off of the mat for less stretch.  PROM hamstring stretch.  Sitting edge of mat with long arc quad adding ankle DF/PF in sitting.  Worked on mechanics of sit<>stand with cues on scooting to edge of mat and powering up for posture.               PT Short Term Goals - 01/22/19 0905      PT SHORT TERM GOAL #1   Title  Patient will be independent with initial HEP focusing on bilateral LE strength and balance to build upon gains made in therapy.    Time  4    Period  Weeks    Status  Achieved    Target Date  01/20/19      PT SHORT TERM GOAL #2   Title  Patient will increase BERG score to at least a 20/56 in order to help decrease risk of future falls.    Baseline  21/56 on 01/20/19    Time  4    Period  Weeks    Status  Achieved    Target Date  01/20/19      PT SHORT TERM GOAL #3   Title  Patient will perform sit <> stand with min A from therapist and no UE support in order to help decrease caregiver burden at home during transfers.    Baseline  Min assist-supervision with no UE assist    Time  4    Period  Weeks    Status  Achieved    Target Date  01/20/19      PT SHORT TERM GOAL #4   Title  Patient will ambulate 100 ft with LRAD and CGA from therapist in order to improve household functional mobility.    Baseline  100 ft with rollator with min assist-01/20/19    Time  4    Period  Weeks    Status  Partially Met    Target Date  01/20/19      PT SHORT TERM GOAL #5   Title  Patient will participate in further assessment of gait speed.    Baseline  .61 ft/sec    Time  4    Period  Weeks    Status  Achieved    Target Date  01/20/19      PT SHORT TERM GOAL #6   Title  Patient will perform all bed mobility with supervision in order to decrease caregiver burden and  increase independence.  Baseline  Pt still requiring min assist    Time  4    Period  Weeks    Status  Not Met    Target Date  01/20/19        PT Long Term Goals - 01/22/19 0905      PT LONG TERM GOAL #1   Title  Patient will be independent with final HEP in order to continue building upon functional gains made in therapy.  (DUE 02/19/19)    Time  8    Period  Weeks    Status  New      PT LONG TERM GOAL #2   Title  Patient will ambulate at least 150 feet with LRAD and supervision in order to improve functional mobility in the household and community.    Baseline  115' with min A from therapist with rollator    Time  8    Period  Weeks    Status  Revised      PT LONG TERM GOAL #3   Title  Patient will increase BERG score to at least 26/56 in order to decrease risk of future falls.    Baseline  21/56    Time  8    Period  Weeks    Status  Revised      PT LONG TERM GOAL #4   Title  Patient will perform functional transfers and sit <> stands with mod I in order to decrease caregiver burden.    Baseline  supervision-min A sit <> stand and stand pivot    Time  8    Period  Weeks    Status  Revised      PT LONG TERM GOAL #5   Title  Patient will increase gait speed to at least 1.0 ft/sec with LRAD in order to decrease risk of falls and improve household ambulation.    Baseline  .80 ft/sec with RW and rollator    Time  8    Period  Weeks    Status  Revised      PT LONG TERM GOAL #6   Title  Patient will ascend/descend 3 steps using LRAD with supervision in order to enter/exit home without use of a ramp.    Time  8    Period  Weeks    Status  New            Plan - 01/27/19 0034    Clinical Impression Statement  The patient tolerated standing and weight shift activities well today.  She fatigues easily and dec'd flexibility throughout bilat LEs limits her gait mechanics.  PT to continue towards  LTGs progressing gait, standing tolerance, upright posture, endurance  to optimize functional mobility.    PT Treatment/Interventions  ADLs/Self Care Home Management;Gait training;DME Instruction;Stair training;Functional mobility training;Therapeutic activities;Patient/family education;Neuromuscular re-education;Balance training;Therapeutic exercise;Passive range of motion    PT Next Visit Plan  Continue Stretching gastroc and hip flexors at counter with weight shifting.  Walking to/from car with rollator; to/from waiting area with rollator, endurance on Nustep, lateral/diagnonal weight shifting and stepping backwards and side to side, postural strengthening/overhead activies on R, LE strengthening (sit <> stands w/ focus on anterior pelvic tilt), LE extensor strengthening    Consulted and Agree with Plan of Care  Patient;Family member/caregiver    Family Member Consulted  Daughter, [name]       Patient will benefit from skilled therapeutic intervention in order to improve the following deficits and impairments:  Abnormal gait, Decreased  activity tolerance, Decreased balance, Decreased range of motion, Decreased mobility, Decreased endurance, Decreased skin integrity, Decreased strength, Difficulty walking, Impaired perceived functional ability, Postural dysfunction  Visit Diagnosis: 1. Unsteadiness on feet   2. Muscle weakness (generalized)   3. Other abnormalities of gait and mobility        Problem List Patient Active Problem List   Diagnosis Date Noted  . Lethargy 10/07/2018  . Pressure injury of skin 09/27/2018  . Pelvic fracture ([place]) 09/21/2018  . Allergic reaction caused by a drug 05/06/2018  . Bilateral leg edema 04/13/2018  . HLD (hyperlipidemia) 03/23/2018  . Mood swings 03/23/2018  . Chronic kidney disease (CKD), stage III (moderate) (HCC)   . Embolic stroke ([place]) 16/60/6301  . Left hemiparesis ([place])   . Diabetes mellitus type 2 in nonobese (HCC)   . Acute ischemic stroke ([place])   . Left arm weakness   . Acute CVA (cerebrovascular accident)  ([place]) 02/24/2018  . 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 Overweight (BMI 25.0-29.9) 02/24/2018  . CKD (chronic kidney disease), stage III ([place]) 02/24/2018  . Diabetes mellitus without complication ([place]) 60/04/9322  . HTN (hypertension) 09/02/2017  . Knee pain, bilateral 09/02/2017    [name] , PT 01/27/2019, 8:58 AM  [facility] [address] [place], [place], [address] Phone: [phone]   Fax:  [phone]  Name: [name] MRN: [id number] Date of Birth: 21-Jun-1946

## 2019-01-27 NOTE — Therapy (Signed)
[facility] [address] [place], [place], [address] Phone: [phone]   Fax:  [phone]  Occupational Therapy Treatment  Patient Details  Name: [name] MRN: [id number] Date of Birth: [birth year] Referring Provider (OT): Dr. Letta Pate   Encounter Date: 01/27/2019  OT End of Session - 01/27/19 0816    Visit Number  7    Number of Visits  17    Date for OT Re-Evaluation  02/16/19    Authorization Type  [employer] MCR    Authorization - Visit Number  7    Authorization - Number of Visits  10    OT Start Time  0705    OT Stop Time  0750    OT Time Calculation (min)  45 min    Activity Tolerance  Patient tolerated treatment well    Behavior During Therapy  [facility] for tasks assessed/performed       Past Medical History:  Diagnosis Date  . Allergy   . Arthritis   . Cataract   . Diabetes mellitus without complication ([place])   . Hypertension   . Macular degeneration syndrome    with edema---being treated.     Past Surgical History:  Procedure Laterality Date  . callous removal  Left 2017   located on 1st digit on L foot 2/2 diabetes  . EYE SURGERY      There were no vitals filed for this visit.  Subjective Assessment - 01/27/19 0815    Subjective   Pt reports that bed mobility and dressing is improving.    Patient is accompanied by:  Family member    Pertinent History  CVA 02/27/18, pelvic fx and Lt hip  fx 09/2018. PMH: HTN, OA    Limitations  Fall risk, wounds on feet    Currently in Pain?  No/denies        CLINIC OPERATION CHANGES: Outpatient Neuro Rehab is open at lower capacity following universal masking, social distancing, and patient screening.  The patient's COVID risk of complications score is 5.   Bed mobility.  Sitting>supine with min cueing/A for alignment.  Supine>Sitting with mod I.    In supine, AAROM shoulder flex and chest press with BUEs with ball with min-mod facilitation/cueing for  normal movement patterns.    Wt. Bearing through LUE with hand on mat and body on arm movements with min-mod cueing/min facilitation for incr scapular stability and tricep activation.    Functional reaching in forward flex with positioning to encourage wt. Shift forward and to the L and laterally to encourage ER/abduction as able.   Supination/shoulder ER to flip large playing cards with lateral functional reach and wt shift to the  L with min-mod cueing for normal movement patterns/wt. Shift.  Folding washcloth with min cueing for incr LUE functional use and normal movement patterns. Simulated drinking from cup with min cueing for normal movement patterns.  Emphasized performing with disassociation/coordination of elbow/shoulder movements and supination for improved normal movement patterns with min-mod cueing.       OT Education - 01/27/19 0845    Education Details  LUE functional use HEP--see pt instructions    Person(s) Educated  Patient    Methods  Explanation;Demonstration;Verbal cues    Comprehension  Verbalized understanding;Returned demonstration;Verbal cues required       OT Short Term Goals - 01/27/19 0738      OT SHORT TERM GOAL #1   Title  Independent with initial HEP for shoulder ROM, and functional reaching  LUE - due 01/22/19    Time  4    Period  Weeks    Status  Achieved      OT SHORT TERM GOAL #2   Title  Pt to perform UB dressing with set up only and LE dressing with mod assist or less using A/E prn    Time  4    Period  Weeks    Status  Achieved      OT SHORT TERM GOAL #3   Title  Pt to increase LUE functional use as evidenced by performing 20 blocks on Box & Blocks test    Baseline  11 blocks    Time  4    Period  Weeks    Status  On-going   01/27/19:  11 blocks     OT SHORT TERM GOAL #4   Title  Pt to perform toileting/clothes management with only min assist    Period  Weeks    Status  Achieved   Able to complete - simulation -  in clinic.  01/27/19:  met  per pt/dtr report     OT SHORT TERM GOAL #5   Title  Pt to achieve 90* shoulder flexion LUE for mid level reaching    Time  4    Period  Weeks    Status  On-going   01/18/19:  75* with min compensation       OT Long Term Goals - 12/17/18 0925      OT LONG TERM GOAL #1   Title  Independent with updated HEP - due 02/16/19    Time  8    Period  Weeks    Status  New      OT LONG TERM GOAL #2   Title  Pt to be mod I with all BADLS using A/E as needed (except shower transfers to still be supervision)    Time  8    Period  Weeks    Status  New      OT LONG TERM GOAL #3   Title  Pt to improve LUE function as evidenced by performing 25 blocks or greater on Box & Blocks test    Time  8    Period  Weeks    Status  New      OT LONG TERM GOAL #4   Title  Pt to improve coordination as evidenced by performing 9 hole peg test completely in 90 sec or less    Baseline  placed 5 in 2 min.    Time  8    Period  Weeks    Status  New      OT LONG TERM GOAL #5   Title  Grip strength Lt hand to be 15 lbs or greater    Baseline  8 lbs    Time  8    Period  Weeks    Status  New      Long Term Additional Goals   Additional Long Term Goals  Yes      OT LONG TERM GOAL #6   Title  Pt to perform simple snack, cold food, and microwaveable food mod I level with DME prn without LOB    Time  8    Period  Weeks    Status  New      OT LONG TERM GOAL #7   Title  Pt to achieve 110* or greater P/ROM Lt shoulder with pain less than or equal to 5/10  Time  8    Period  Weeks    Status  New            Plan - 01/27/19 9409    Clinical Impression Statement  Pt is progressing with improving LUE functional movement an control.  Pt benefits from cueing for normal movement patterns.    OT Frequency  2x / week    OT Duration  8 weeks    OT Treatment/Interventions  Self-care/ADL training;Therapeutic exercise;Functional Mobility Training;Neuromuscular education;Aquatic Therapy;Manual  Therapy;Splinting;Therapeutic activities;Paraffin;DME and/or AE instruction;Cognitive remediation/compensation;Electrical Stimulation;Visual/perceptual remediation/compensation;Moist Heat;Passive range of motion;Patient/family education    Plan  LUE/trunk Neuro re-ed, LUE functional use    Consulted and Agree with Plan of Care  Patient;Family member/caregiver    Family Member Consulted  Daughter Unk Lightning)       Patient will benefit from skilled therapeutic intervention in order to improve the following deficits and impairments:           Visit Diagnosis: 1. Hemiplegia and hemiparesis following cerebral infarction affecting left non-dominant side (HCC)   2. Other lack of coordination   3. Unsteadiness on feet   4. Muscle weakness (generalized)   5. Stiffness of left shoulder, not elsewhere classified   6. Chronic left shoulder pain   7. Other abnormalities of gait and mobility       Problem List Patient Active Problem List   Diagnosis Date Noted  . Lethargy 10/07/2018  . Pressure injury of skin 09/27/2018  . Pelvic fracture ([place]) 09/21/2018  . Allergic reaction caused by a drug 05/06/2018  . Bilateral leg edema 04/13/2018  . HLD (hyperlipidemia) 03/23/2018  . Mood swings 03/23/2018  . Chronic kidney disease (CKD), stage III (moderate) (HCC)   . Embolic stroke ([place]) 11/06/5613  . Left hemiparesis ([place])   . Diabetes mellitus type 2 in nonobese (HCC)   . Acute ischemic stroke ([place])   . Left arm weakness   . Acute CVA (cerebrovascular accident) ([place]) 02/24/2018  . Macular edema due to secondary diabetes ([place]) 02/24/2018  . Overweight (BMI 25.0-29.9) 02/24/2018  . CKD (chronic kidney disease), stage III ([place]) 02/24/2018  . Diabetes mellitus without complication ([place]) 48/84/5733  . HTN (hypertension) 09/02/2017  . Knee pain, bilateral 09/02/2017    [facility] 01/27/2019, 8:47 AM  [facility] [address] [place], [place], [address] Phone: [phone]   Fax:  [phone]  Name: [name] MRN: [id number] Date of Birth: 07/31/1945   Vianne Bulls, OTR/L [facility] [address] [place], [place]  [address] [phone] phone [phone] 01/27/19 8:47 AM

## 2019-01-28 ENCOUNTER — Encounter: Payer: Self-pay | Admitting: Rehabilitative and Restorative Service Providers"

## 2019-01-28 ENCOUNTER — Ambulatory Visit: Payer: Medicare Other | Admitting: Rehabilitative and Restorative Service Providers"

## 2019-01-28 DIAGNOSIS — M6281 Muscle weakness (generalized): Secondary | ICD-10-CM

## 2019-01-28 DIAGNOSIS — R2689 Other abnormalities of gait and mobility: Secondary | ICD-10-CM

## 2019-01-28 DIAGNOSIS — R2681 Unsteadiness on feet: Secondary | ICD-10-CM

## 2019-01-28 DIAGNOSIS — I69354 Hemiplegia and hemiparesis following cerebral infarction affecting left non-dominant side: Secondary | ICD-10-CM | POA: Diagnosis not present

## 2019-01-28 NOTE — Patient Instructions (Signed)
Access Code: [email or web address]  URL: [email or web address]  Date: 01/28/2019  Prepared by: Rudell Cobb   Exercises Sit to Stand with Counter Support - 10 reps - 1 sets - 1x daily - 5x weekly Standing March with Counter Support - 10 reps - 1 sets - 1x daily - 5x weekly Mini Squat with Counter Support - 10 reps - 1 sets - 1x daily - 5x weekly Supine Bridge with Resistance Band - 10 reps - 1 sets - 1x daily - 5x weekly 90/90 Lower Trunk Rotation - 10 reps - 2 sets - 1x daily - 7x weekly Standing Gastroc Stretch on Step with Counter Support - 4 sets - 30 seconds hold - 1x daily - 7x weekly Supine Single Knee to Chest - 3 reps - 1 sets - 30 seconds hold - 1x daily - 7x weekly

## 2019-01-28 NOTE — Therapy (Signed)
[facility] [address] [place], [place], [address] Phone: [phone]   Fax:  [phone]  Physical Therapy Treatment  Patient Details  Name: [name] MRN: [id number] Date of Birth: January 14, 1946 Referring Provider (PT): Alysia Penna, MD  CLINIC OPERATION CHANGES: [facility] is open at lower capacity following universal masking, social distancing, and patient screening.  The patient's COVID risk of complications score is 5.  Encounter Date: 01/28/2019  PT End of Session - 01/28/19 0830    Visit Number  14    Number of Visits  21    Date for PT Re-Evaluation  02/19/19    Authorization Type  [employer] Medicare, 10th visit progress note    PT Start Time  0705    PT Stop Time  0750    PT Time Calculation (min)  45 min    Equipment Utilized During Treatment  --   Requested no gait belt due to it creating a rash in the past   Activity Tolerance  Patient tolerated treatment well;Patient limited by fatigue    Behavior During Therapy  WFL for tasks assessed/performed       Past Medical History:  Diagnosis Date  . Allergy   . Arthritis   . Cataract   . Diabetes mellitus without complication ([facility])   . Hypertension   . Macular degeneration syndrome    with edema---being treated.     Past Surgical History:  Procedure Laterality Date  . callous removal  Left 2017   located on 1st digit on L foot 2/2 diabetes  . EYE SURGERY      There were no vitals filed for this visit.  Subjective Assessment - 01/28/19 0745    Subjective  Patient feels better today than yesterday noting she slept better last night.    Pertinent History  Patient had a R CVA 02/2018 and was walking with a SPC prior to a fall in March 2020 resulting in being hospitalized with multiple pelvic fxs and a L hip fx. During hospital stay, patient acquired pressure ulcers on bilateral soles of feet near heels.Patient is now safely able to bear  weight and pressure ulcers are now a stage 1 (per daughter report and photos).    Patient Stated Goals  "Be back to normal, get back to walking, and getting strength back"    Currently in Pain?  Yes    Pain Score  0-No pain   at rest   Pain Location  Shoulder    Pain Orientation  Left    Pain Descriptors / Indicators  Tender    Pain Type  Chronic pain    Pain Onset  More than a month ago    Pain Frequency  Intermittent    Aggravating Factors   No pain at rest, PT monitors pain during movements for trunk today to ensure tolerable.    Pain Relieving Factors  Proper positioning, <20 degrees ROM                       [facility] Adult PT Treatment/Exercise - 01/28/19 0805      Bed Mobility   Bed Mobility  Supine to Sit;Sit to Supine;Rolling Left    Rolling Left  Supervision/Verbal cueing    Supine to Sit  Contact Guard/Touching assist    Sit to Supine  Contact Guard/Touching assist      Transfers   Transfers  Sit to Stand    Sit to Stand  5: Supervision    Sit to Stand Details (indicate cue type and reason)  Performed after stretching and encouraged scooting to edge of mat, nose over toes, and glut contraction to come to upright position.  Patient is more consistent today with no manual assist.    Stand to Sit  5: Supervision    Stand to Sit Details  Cues for slow descent and to flex at the hips while maintaining trunk lengthening.    Stand Pivot Transfers  4: Min guard      Ambulation/Gait   Ambulation/Gait  Yes    Ambulation/Gait Assistance  4: Min guard    Ambulation/Gait Assistance Details  Patient ambulated after stretching and we verbally cued into upright posture gained from stretching, and encouraged longer stride length.   At end of session the patient walked from the first mat in the gym through the lobby, atrium, and made it 50 feet outdoors on paved surfaces towards her car with CGA increasing to min A due to L foot dec'd clearance as she fatigued.  The patient  needed some encouragement to continue and was able to discuss prior travel that distracted her from task.    Ambulation Distance (Feet)  115 Feet   1 rep of 115 ft, then 60 ft x 2 when in the gym   Assistive device  Rollator    Gait Pattern  Step-through pattern;Decreased step length - right;Decreased weight shift to left;Poor foot clearance - right;Decreased dorsiflexion - left    Ambulation Surface  Level;Indoor      Neuro Re-ed    Neuro Re-ed Details   After stretching, worked on Office manager of sit<>stand encouraging anterior pelvic rotation.  Stood x 3 reps without rollator RW in front of patient working on postural control.  Performed L lateral weight shifting with visual and demo cues + tactile cues.        Exercises   Exercises  Other Exercises    Other Exercises   Performed seated lateral trunk initiation for warm up reaching R arm (supported on a ball) to the right, performing left elbow propping/right elbow propping doing contralateral hip hiking with facilitation; reaching anteriorly rolling physioball ant/post with R UE with cues for trunk elongation.  Supine hip flexor stretch bringing single knee to chest (PROM) to stretch contralateral side.  This was modified due to pain with modified thomas test yesterday during PT.  Provided this for HEP*.  Supine trunk rotation, hip ER from hooklying and then PROM hip adductor stretch.               PT Education - 01/28/19 6720    Education Details  Added knee to chest PROM for HEP in order to stretch contralateral hip flexors.  Discussed working towards walking into clinic, standing more frequently at home educating that WB/more standing will facilitate stretching LE musculature.    Person(s) Educated  Patient;Caregiver(s)    Methods  Explanation;Demonstration;Handout    Comprehension  Verbalized understanding;Returned demonstration       PT Short Term Goals - 01/22/19 0905      PT SHORT TERM GOAL #1   Title  Patient will be  independent with initial HEP focusing on bilateral LE strength and balance to build upon gains made in therapy.    Time  4    Period  Weeks    Status  Achieved    Target Date  01/20/19      PT SHORT TERM GOAL #2   Title  Patient  will increase BERG score to at least a 20/56 in order to help decrease risk of future falls.    Baseline  21/56 on 01/20/19    Time  4    Period  Weeks    Status  Achieved    Target Date  01/20/19      PT SHORT TERM GOAL #3   Title  Patient will perform sit <> stand with min A from therapist and no UE support in order to help decrease caregiver burden at home during transfers.    Baseline  Min assist-supervision with no UE assist    Time  4    Period  Weeks    Status  Achieved    Target Date  01/20/19      PT SHORT TERM GOAL #4   Title  Patient will ambulate 100 ft with LRAD and CGA from therapist in order to improve household functional mobility.    Baseline  100 ft with rollator with min assist-01/20/19    Time  4    Period  Weeks    Status  Partially Met    Target Date  01/20/19      PT SHORT TERM GOAL #5   Title  Patient will participate in further assessment of gait speed.    Baseline  .58 ft/sec    Time  4    Period  Weeks    Status  Achieved    Target Date  01/20/19      PT SHORT TERM GOAL #6   Title  Patient will perform all bed mobility with supervision in order to decrease caregiver burden and increase independence.    Baseline  Pt still requiring min assist    Time  4    Period  Weeks    Status  Not Met    Target Date  01/20/19        PT Long Term Goals - 01/22/19 0905      PT LONG TERM GOAL #1   Title  Patient will be independent with final HEP in order to continue building upon functional gains made in therapy.  (DUE 02/19/19)    Time  8    Period  Weeks    Status  New      PT LONG TERM GOAL #2   Title  Patient will ambulate at least 150 feet with LRAD and supervision in order to improve functional mobility in the household  and community.    Baseline  115' with min A from therapist with rollator    Time  8    Period  Weeks    Status  Revised      PT LONG TERM GOAL #3   Title  Patient will increase BERG score to at least 26/56 in order to decrease risk of future falls.    Baseline  21/56    Time  8    Period  Weeks    Status  Revised      PT LONG TERM GOAL #4   Title  Patient will perform functional transfers and sit <> stands with mod I in order to decrease caregiver burden.    Baseline  supervision-min A sit <> stand and stand pivot    Time  8    Period  Weeks    Status  Revised      PT LONG TERM GOAL #5   Title  Patient will increase gait speed to at least 1.0 ft/sec with LRAD in order  to decrease risk of falls and improve household ambulation.    Baseline  .45 ft/sec with RW and rollator    Time  8    Period  Weeks    Status  Revised      PT LONG TERM GOAL #6   Title  Patient will ascend/descend 3 steps using LRAD with supervision in order to enter/exit home without use of a ramp.    Time  8    Period  Weeks    Status  New            Plan - 01/28/19 0830    Clinical Impression Statement  The patient has improved gait mechanics after LE stretching.  PT discussed effects of sitting with shortening of anterior musculature and regular standing will help improve mobility.  Patient was able to walk 1 lap (115 ft) with only 1 instance of dec'd L foot clearance and needed only CGA during first lap.  As she fatigues, the L foot does not clear as consistently and PT increased assist level to min A due to safety when fatigued.  Patient notes she is not walking often in the home due to fear.  PT to continue to encourage more standing activity to increase confidence in upright position and progress to more walking at home and into/out of clinic as patient confidence increases.    PT Treatment/Interventions  ADLs/Self Care Home Management;Gait training;DME Instruction;Stair training;Functional mobility  training;Therapeutic activities;Patient/family education;Neuromuscular re-education;Balance training;Therapeutic exercise;Passive range of motion    PT Next Visit Plan  LE stretching:  Hip stretching (flexors, into ER and hip adductors), hamstring stretching, gastroc stretching.  Gluteal activation in standing (wall bumps, sit<>stand).  Increased walking in clinic encouraging carryover to home and entering/exiting clinic.  Postural stretching/strengthening for improved upright posture.  Weight shifting facilitating knee extension and hip anterior translation during mid stance phase of gait.    Consulted and Agree with Plan of Care  Patient;Family member/caregiver    Family Member Consulted  Daughter, [name]       Patient will benefit from skilled therapeutic intervention in order to improve the following deficits and impairments:  Abnormal gait, Decreased activity tolerance, Decreased balance, Decreased range of motion, Decreased mobility, Decreased endurance, Decreased skin integrity, Decreased strength, Difficulty walking, Impaired perceived functional ability, Postural dysfunction  Visit Diagnosis: 1. Unsteadiness on feet   2. Muscle weakness (generalized)   3. Other abnormalities of gait and mobility        Problem List Patient Active Problem List   Diagnosis Date Noted  . Lethargy 10/07/2018  . Pressure injury of skin 09/27/2018  . Pelvic fracture ([facility]) 09/21/2018  . Allergic reaction caused by a drug 05/06/2018  . Bilateral leg edema 04/13/2018  . HLD (hyperlipidemia) 03/23/2018  . Mood swings 03/23/2018  . Chronic kidney disease (CKD), stage III (moderate) ([facility])   . Embolic stroke ([place]) 20/25/4270  . Left hemiparesis ([place])   . Diabetes mellitus type 2 in nonobese ([facility])   . Acute ischemic stroke ([place])   . Left arm weakness   . Acute CVA (cerebrovascular accident) ([facility]) 02/24/2018  . 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 Overweight (BMI 25.0-29.9)  02/24/2018  . CKD (chronic kidney disease), stage III ([place]) 02/24/2018  . Diabetes mellitus without complication ([facility]) 62/37/6283  . HTN (hypertension) 09/02/2017  . Knee pain, bilateral 09/02/2017    Shakeia Krus, PT 01/28/2019, 8:34 AM  [facility] [address] [place], [place],  [address] Phone: [phone]   Fax:  [phone]  Name: [name] MRN: [id number] Date of Birth: 1945-12-07

## 2019-02-01 ENCOUNTER — Other Ambulatory Visit: Payer: Self-pay

## 2019-02-01 ENCOUNTER — Ambulatory Visit: Payer: Medicare Other | Admitting: Occupational Therapy

## 2019-02-01 ENCOUNTER — Encounter: Payer: Self-pay | Admitting: Physical Therapy

## 2019-02-01 ENCOUNTER — Encounter: Payer: Self-pay | Admitting: Occupational Therapy

## 2019-02-01 ENCOUNTER — Ambulatory Visit: Payer: Medicare Other | Admitting: Physical Therapy

## 2019-02-01 DIAGNOSIS — R2681 Unsteadiness on feet: Secondary | ICD-10-CM

## 2019-02-01 DIAGNOSIS — M6281 Muscle weakness (generalized): Secondary | ICD-10-CM

## 2019-02-01 DIAGNOSIS — G8929 Other chronic pain: Secondary | ICD-10-CM

## 2019-02-01 DIAGNOSIS — R278 Other lack of coordination: Secondary | ICD-10-CM

## 2019-02-01 DIAGNOSIS — I69354 Hemiplegia and hemiparesis following cerebral infarction affecting left non-dominant side: Secondary | ICD-10-CM

## 2019-02-01 DIAGNOSIS — M25512 Pain in left shoulder: Secondary | ICD-10-CM

## 2019-02-01 DIAGNOSIS — R2689 Other abnormalities of gait and mobility: Secondary | ICD-10-CM

## 2019-02-01 DIAGNOSIS — M25612 Stiffness of left shoulder, not elsewhere classified: Secondary | ICD-10-CM

## 2019-02-01 NOTE — Therapy (Signed)
[facility] [address] [place], [place], [address] Phone: [phone]   Fax:  [phone]  Physical Therapy Treatment  Patient Details  Name: [name] MRN: [id number] Date of Birth: 1946-05-04 Referring Provider (PT): Alysia Penna, MD   Encounter Date: 02/01/2019  PT End of Session - 02/01/19 0747    Visit Number  15    Number of Visits  21    Date for PT Re-Evaluation  02/19/19    Authorization Type  [employer] Medicare, 10th visit progress note    PT Start Time  0703    PT Stop Time  0745    PT Time Calculation (min)  42 min    Equipment Utilized During Treatment  --   Requested no gait belt due to it creating a rash in the past   Activity Tolerance  Patient tolerated treatment well;Patient limited by fatigue    Behavior During Therapy  [facility] for tasks assessed/performed       Past Medical History:  Diagnosis Date  . Allergy   . Arthritis   . Cataract   . Diabetes mellitus without complication ([facility])   . Hypertension   . Macular degeneration syndrome    with edema---being treated.     Past Surgical History:  Procedure Laterality Date  . callous removal  Left 2017   located on 1st digit on L foot 2/2 diabetes  . EYE SURGERY      There were no vitals filed for this visit.  Subjective Assessment - 02/01/19 0704    Subjective  doing well, daughter here today.  recommended stretching prior.    Pertinent History  Patient had a R CVA 02/2018 and was walking with a SPC prior to a fall in March 2020 resulting in being hospitalized with multiple pelvic fxs and a L hip fx. During hospital stay, patient acquired pressure ulcers on bilateral soles of feet near heels.Patient is now safely able to bear weight and pressure ulcers are now a stage 1 (per daughter report and photos).    Patient Stated Goals  "Be back to normal, get back to walking, and getting strength back"    Currently in Pain?  No/denies    Pain  Onset  More than a month ago                       [facility] Adult PT Treatment/Exercise - 02/01/19 0708      Ambulation/Gait   Ambulation/Gait  Yes    Ambulation/Gait Assistance  4: Min guard    Ambulation/Gait Assistance Details  min cues for step length and heel strike - improved with cues    Ambulation Distance (Feet)  115 Feet   62.5' x 2   Assistive device  Rollator    Gait Pattern  Step-through pattern;Decreased step length - right;Decreased weight shift to left;Poor foot clearance - right;Decreased dorsiflexion - left    Ambulation Surface  Level;Indoor      Exercises   Exercises  Lumbar      Lumbar Exercises: Stretches   Passive Hamstring Stretch  Right;Left;2 reps;30 seconds    Passive Hamstring Stretch Limitations  seated with strap for gastroc stretch    Single Knee to Chest Stretch  Right;Left;2 reps;30 seconds    Lower Trunk Rotation  2 reps;30 seconds   bil   Figure 4 Stretch  2 reps;30 seconds;Supine;With overpressure   knee to opposite shoulder     Knee/Hip Exercises: Supine  Bridges  Both;10 reps    Other Supine Knee/Hip Exercises  hooklying single limb clamshell x10 bil; red theraband               PT Short Term Goals - 01/22/19 0905      PT SHORT TERM GOAL #1   Title  Patient will be independent with initial HEP focusing on bilateral LE strength and balance to build upon gains made in therapy.    Time  4    Period  Weeks    Status  Achieved    Target Date  01/20/19      PT SHORT TERM GOAL #2   Title  Patient will increase BERG score to at least a 20/56 in order to help decrease risk of future falls.    Baseline  21/56 on 01/20/19    Time  4    Period  Weeks    Status  Achieved    Target Date  01/20/19      PT SHORT TERM GOAL #3   Title  Patient will perform sit <> stand with min A from therapist and no UE support in order to help decrease caregiver burden at home during transfers.    Baseline  Min assist-supervision with no  UE assist    Time  4    Period  Weeks    Status  Achieved    Target Date  01/20/19      PT SHORT TERM GOAL #4   Title  Patient will ambulate 100 ft with LRAD and CGA from therapist in order to improve household functional mobility.    Baseline  100 ft with rollator with min assist-01/20/19    Time  4    Period  Weeks    Status  Partially Met    Target Date  01/20/19      PT SHORT TERM GOAL #5   Title  Patient will participate in further assessment of gait speed.    Baseline  .27 ft/sec    Time  4    Period  Weeks    Status  Achieved    Target Date  01/20/19      PT SHORT TERM GOAL #6   Title  Patient will perform all bed mobility with supervision in order to decrease caregiver burden and increase independence.    Baseline  Pt still requiring min assist    Time  4    Period  Weeks    Status  Not Met    Target Date  01/20/19        PT Long Term Goals - 01/22/19 0905      PT LONG TERM GOAL #1   Title  Patient will be independent with final HEP in order to continue building upon functional gains made in therapy.  (DUE 02/19/19)    Time  8    Period  Weeks    Status  New      PT LONG TERM GOAL #2   Title  Patient will ambulate at least 150 feet with LRAD and supervision in order to improve functional mobility in the household and community.    Baseline  115' with min A from therapist with rollator    Time  8    Period  Weeks    Status  Revised      PT LONG TERM GOAL #3   Title  Patient will increase BERG score to at least 26/56 in order to decrease risk of future  falls.    Baseline  21/56    Time  8    Period  Weeks    Status  Revised      PT LONG TERM GOAL #4   Title  Patient will perform functional transfers and sit <> stands with mod I in order to decrease caregiver burden.    Baseline  supervision-min A sit <> stand and stand pivot    Time  8    Period  Weeks    Status  Revised      PT LONG TERM GOAL #5   Title  Patient will increase gait speed to at least  1.0 ft/sec with LRAD in order to decrease risk of falls and improve household ambulation.    Baseline  .55 ft/sec with RW and rollator    Time  8    Period  Weeks    Status  Revised      PT LONG TERM GOAL #6   Title  Patient will ascend/descend 3 steps using LRAD with supervision in order to enter/exit home without use of a ramp.    Time  8    Period  Weeks    Status  New            Plan - 02/01/19 0747    Clinical Impression Statement  Stretching performed today prior to ambulation with improved foot clearance and step length.  Pt progressing well with PT, and revised some stretches today to sitting to allow for pt to perform independently at home.    PT Treatment/Interventions  ADLs/Self Care Home Management;Gait training;DME Instruction;Stair training;Functional mobility training;Therapeutic activities;Patient/family education;Neuromuscular re-education;Balance training;Therapeutic exercise;Passive range of motion    PT Next Visit Plan  LE stretching:  Hip stretching (flexors, into ER and hip adductors), hamstring stretching, gastroc stretching.  Gluteal activation in standing (wall bumps, sit<>stand).  Increased walking in clinic encouraging carryover to home and entering/exiting clinic.  Postural stretching/strengthening for improved upright posture.  Weight shifting facilitating knee extension and hip anterior translation during mid stance phase of gait.    Consulted and Agree with Plan of Care  Patient;Family member/caregiver    Family Member Consulted  Daughter, [name]       Patient will benefit from skilled therapeutic intervention in order to improve the following deficits and impairments:  Abnormal gait, Decreased activity tolerance, Decreased balance, Decreased range of motion, Decreased mobility, Decreased endurance, Decreased skin integrity, Decreased strength, Difficulty walking, Impaired perceived functional ability, Postural dysfunction  Visit Diagnosis: 1. Unsteadiness  on feet   2. Muscle weakness (generalized)   3. Other abnormalities of gait and mobility   4. Hemiplegia and hemiparesis following cerebral infarction affecting left non-dominant side (HCC)   5. Other lack of coordination        Problem List Patient Active Problem List   Diagnosis Date Noted  . Lethargy 10/07/2018  . Pressure injury of skin 09/27/2018  . Pelvic fracture ([place]) 09/21/2018  . Allergic reaction caused by a drug 05/06/2018  . Bilateral leg edema 04/13/2018  . HLD (hyperlipidemia) 03/23/2018  . Mood swings 03/23/2018  . Chronic kidney disease (CKD), stage III (moderate) ([facility]CC)   . Embolic stroke ([place]) 09/47/0962  . Left hemiparesis ([place])   . Diabetes mellitus type 2 in nonobese ([facility]CC)   . Acute ischemic stroke ([place])   . Left arm weakness   . Acute CVA (cerebrovascular accident) ([place]) 02/24/2018  . Macular edema due to secondary diabetes ([place]) 02/24/2018  . Overweight (BMI 25.0-29.9) 02/24/2018  .  CKD (chronic kidney disease), stage III ([place]) 02/24/2018  . Diabetes mellitus without complication ([place]) 14/60/4799  . HTN (hypertension) 09/02/2017  . Knee pain, bilateral 09/02/2017       Laureen Abrahams, PT, DPT 02/01/19 7:49 AM      [facility] [address] [place], [place], [address] Phone: [phone]   Fax:  [phone]  Name: [name] MRN: [id number] Date of Birth: [birth year]

## 2019-02-01 NOTE — Therapy (Signed)
[facility] [address] [place], [place], [address] Phone: [phone]   Fax:  [phone]  Occupational Therapy Treatment  Patient Details  Name: [name] MRN: [id number] Date of Birth: 1945-11-09 Referring Provider (OT): Dr. Letta Pate   Encounter Date: 02/01/2019  OT End of Session - 02/01/19 0915    Visit Number  8    Number of Visits  17    Date for OT Re-Evaluation  02/16/19    Authorization Type  [employer] MCR    Authorization - Visit Number  8    Authorization - Number of Visits  10    OT Start Time  0800    OT Stop Time  0846    OT Time Calculation (min)  46 min    Activity Tolerance  Patient tolerated treatment well    Behavior During Therapy  [facility] for tasks assessed/performed       Past Medical History:  Diagnosis Date  . Allergy   . Arthritis   . Cataract   . Diabetes mellitus without complication ([place])   . Hypertension   . Macular degeneration syndrome    with edema---being treated.     Past Surgical History:  Procedure Laterality Date  . callous removal  Left 2017   located on 1st digit on L foot 2/2 diabetes  . EYE SURGERY      There were no vitals filed for this visit.  Subjective Assessment - 02/01/19 0756    Subjective   doing good    Patient is accompanied by:  Family member    Pertinent History  CVA 02/27/18, pelvic fx and Lt hip  fx 09/2018. PMH: HTN, OA    Limitations  Fall risk, wounds on feet    Currently in Pain?  No/denies       CLINIC OPERATION CHANGES: Outpatient Neuro Rehab is open at lower capacity following universal masking, social distancing, and patient screening.  The patient's COVID risk of complications score is 5.   In sitting, AAROM shoulder flex and abduction with BUEs with ball with min-mod facilitation/cueing for normal movement patterns, scapular facilitation.    Functional activities using BUEs:  Tying bow (shoestrings) with min cueing initially and  good success, incr difficulty upon 2nd trial with mod cueing.  Fastening/unfastening buttons on sweater (large) with mod difficulty/cueing to use LUE.  Placing shirt on hanger with min cueing to incr use of LUE.  Fastening/unfastening lid on bottle with LUE with min-mod cueing for incr LUE functional use.  Lacing with LUE as stabilizer with min difficulty.  Functional activities using LUE:  Placing large pegs in pegboard with mod cueing/difficulty for in-hand manipulation.  Unfastening velcro strap (both directions) with min difficulty.  Functional reaching LUE to pick up and place coins in coin bank with low-mid range functional reach.     OT Short Term Goals - 01/27/19 5277      OT SHORT TERM GOAL #1   Title  Independent with initial HEP for shoulder ROM, and functional reaching LUE - due 01/22/19    Time  4    Period  Weeks    Status  Achieved      OT SHORT TERM GOAL #2   Title  Pt to perform UB dressing with set up only and LE dressing with mod assist or less using A/E prn    Time  4    Period  Weeks    Status  Achieved  OT SHORT TERM GOAL #3   Title  Pt to increase LUE functional use as evidenced by performing 20 blocks on Box & Blocks test    Baseline  11 blocks    Time  4    Period  Weeks    Status  On-going   01/27/19:  11 blocks     OT SHORT TERM GOAL #4   Title  Pt to perform toileting/clothes management with only min assist    Period  Weeks    Status  Achieved   Able to complete - simulation -  in clinic.  01/27/19:  met per pt/dtr report     OT SHORT TERM GOAL #5   Title  Pt to achieve 90* shoulder flexion LUE for mid level reaching    Time  4    Period  Weeks    Status  On-going   01/18/19:  75* with min compensation       OT Long Term Goals - 12/17/18 0925      OT LONG TERM GOAL #1   Title  Independent with updated HEP - due 02/16/19    Time  8    Period  Weeks    Status  New      OT LONG TERM GOAL #2   Title  Pt to be mod I with all BADLS using A/E  as needed (except shower transfers to still be supervision)    Time  8    Period  Weeks    Status  New      OT LONG TERM GOAL #3   Title  Pt to improve LUE function as evidenced by performing 25 blocks or greater on Box & Blocks test    Time  8    Period  Weeks    Status  New      OT LONG TERM GOAL #4   Title  Pt to improve coordination as evidenced by performing 9 hole peg test completely in 90 sec or less    Baseline  placed 5 in 2 min.    Time  8    Period  Weeks    Status  New      OT LONG TERM GOAL #5   Title  Grip strength Lt hand to be 15 lbs or greater    Baseline  8 lbs    Time  8    Period  Weeks    Status  New      Long Term Additional Goals   Additional Long Term Goals  Yes      OT LONG TERM GOAL #6   Title  Pt to perform simple snack, cold food, and microwaveable food mod I level with DME prn without LOB    Time  8    Period  Weeks    Status  New      OT LONG TERM GOAL #7   Title  Pt to achieve 110* or greater P/ROM Lt shoulder with pain less than or equal to 5/10    Time  8    Period  Weeks    Status  New            Plan - 02/01/19 4098    Clinical Impression Statement  Pt is progressing with improving LUE functional movement and control.  Pt benefits from cueing for normal movement patterns.  Encouraged pt to incr LUE functional use at home as pt appears to demo learned nonuse.    OT  Frequency  2x / week    OT Duration  8 weeks    OT Treatment/Interventions  Self-care/ADL training;Therapeutic exercise;Functional Mobility Training;Neuromuscular education;Aquatic Therapy;Manual Therapy;Splinting;Therapeutic activities;Paraffin;DME and/or AE instruction;Cognitive remediation/compensation;Electrical Stimulation;Visual/perceptual remediation/compensation;Moist Heat;Passive range of motion;Patient/family education    Plan  LUE/trunk Neuro re-ed, LUE functional use    Consulted and Agree with Plan of Care  Patient;Family member/caregiver    Family Member  Consulted  Daughter Unk Lightning)       Patient will benefit from skilled therapeutic intervention in order to improve the following deficits and impairments:           Visit Diagnosis: 1. Hemiplegia and hemiparesis following cerebral infarction affecting left non-dominant side (HCC)   2. Other lack of coordination   3. Stiffness of left shoulder, not elsewhere classified   4. Chronic left shoulder pain   5. Other abnormalities of gait and mobility   6. Muscle weakness (generalized)   7. Unsteadiness on feet       Problem List Patient Active Problem List   Diagnosis Date Noted  . Lethargy 10/07/2018  . Pressure injury of skin 09/27/2018  . Pelvic fracture ([place]) 09/21/2018  . Allergic reaction caused by a drug 05/06/2018  . Bilateral leg edema 04/13/2018  . HLD (hyperlipidemia) 03/23/2018  . Mood swings 03/23/2018  . Chronic kidney disease (CKD), stage III (moderate) (HCC)   . Embolic stroke ([place]) 07/86/7544  . Left hemiparesis ([place])   . Diabetes mellitus type 2 in nonobese (HCC)   . Acute ischemic stroke ([place])   . Left arm weakness   . Acute CVA (cerebrovascular accident) ([place]) 02/24/2018  . Macular edema due to secondary diabetes ([place]) 02/24/2018  . Overweight (BMI 25.0-29.9) 02/24/2018  . CKD (chronic kidney disease), stage III ([place]) 02/24/2018  . Diabetes mellitus without complication ([place]) 92/07/69  . HTN (hypertension) 09/02/2017  . Knee pain, bilateral 09/02/2017    [facility] 02/01/2019, 9:34 AM  [facility] [address] [place], [place], [address] Phone: [phone]   Fax:  [phone]  Name: KATHERINE TOUT MRN: [id number] Date of Birth: [birth year]   Vianne Bulls, OTR/L [facility] [address] [place], [place]  [address] [phone] phone [phone] 02/01/19 9:34 AM

## 2019-02-01 NOTE — Patient Instructions (Signed)
Access Code: [email or web address]  URL: [email or web address]  Date: 02/01/2019  Prepared by: Faustino Congress   Exercises  Sit to Stand with Counter Support - 10 reps - 1 sets - 1x daily - 5x weekly  Standing March with Counter Support - 10 reps - 1 sets - 1x daily - 5x weekly  Mini Squat with Counter Support - 10 reps - 1 sets - 1x daily - 5x weekly  Supine Bridge with Resistance Band - 10 reps - 1 sets - 1x daily - 5x weekly  90/90 Lower Trunk Rotation - 10 reps - 2 sets - 1x daily - 7x weekly  Standing Gastroc Stretch on Step with Counter Support - 4 sets - 30 seconds hold - 1x daily - 7x weekly  Supine Single Knee to Chest - 3 reps - 1 sets - 30 seconds hold - 1x daily - 7x weekly  Seated Hamstring Stretch - 3 reps - 1 sets - 30 sec hold - 2x daily - 7x weekly  Supine Piriformis Stretch with Foot on Ground - 3 reps - 1 sets - 30 sec hold - 2x daily - 7x weekly  Seated Piriformis Stretch - 3 reps - 1 sets - 30 sec hold - 2x daily - 7x weekly

## 2019-02-02 ENCOUNTER — Telehealth: Payer: Self-pay

## 2019-02-02 ENCOUNTER — Telehealth: Payer: Self-pay | Admitting: Family Medicine

## 2019-02-02 NOTE — Telephone Encounter (Signed)
Questions for Screening COVID-19  Symptom onset: None  Travel or Contacts: None  During this illness, did/does the patient experience any of the following symptoms? Fever >100.34F []   Yes [x]   No []   Unknown Subjective fever (felt feverish) []   Yes [x]   No []   Unknown Chills []   Yes [x]   No []   Unknown Muscle aches (myalgia) []   Yes [x]   No []   Unknown Runny nose (rhinorrhea) []   Yes [x]   No []   Unknown Sore throat []   Yes [x]   No []   Unknown Cough (new onset or worsening of chronic cough) []   Yes [x]   No []   Unknown Shortness of breath (dyspnea) []   Yes [x]   No []   Unknown Nausea or vomiting []   Yes [x]   No []   Unknown Headache []   Yes [x]   No []   Unknown Abdominal pain  []   Yes [x]   No []   Unknown Diarrhea (?3 loose/looser than normal stools/24hr period) []   Yes [x]   No []   Unknown Other, specify:  Patient risk factors: Smoker? []   Current []   Former []   Never If female, currently pregnant? []   Yes []   No  Patient Active Problem List   Diagnosis Date Noted  . Lethargy 10/07/2018  . Pressure injury of skin 09/27/2018  . Pelvic fracture ([place]) 09/21/2018  . Allergic reaction caused by a drug 05/06/2018  . Bilateral leg edema 04/13/2018  . HLD (hyperlipidemia) 03/23/2018  . Mood swings 03/23/2018  . Chronic kidney disease (CKD), stage III (moderate) (HCC)   . Embolic stroke ([place]) 35/59/7416  . Left hemiparesis ([place])   . Diabetes mellitus type 2 in nonobese (HCC)   . Acute ischemic stroke ([place])   . Left arm weakness   . Acute CVA (cerebrovascular accident) ([place]) 02/24/2018  . Macular edema due to secondary diabetes ([place]) 02/24/2018  . Overweight (BMI 25.0-29.9) 02/24/2018  . CKD (chronic kidney disease), stage III ([place]) 02/24/2018  . Diabetes mellitus without complication ([place]) 38/45/3646  . HTN (hypertension) 09/02/2017  . Knee pain, bilateral 09/02/2017    Plan:  []   High risk for COVID-19 with red flags go to ED (with CP, SOB, weak/lightheaded, or fever > 101.5). Call  ahead.  []   High risk for COVID-19 but stable. Inform provider and coordinate time for [facility] visit.   []   No red flags but URI signs or symptoms okay for [facility] visit.

## 2019-02-02 NOTE — Telephone Encounter (Signed)
I called and left message on patient voicemail to call office and reschedule appointment 02/10/2019 with Dr. Deborra Medina. She will not be in the office that day.

## 2019-02-03 ENCOUNTER — Encounter: Payer: Self-pay | Admitting: Physical Therapy

## 2019-02-03 ENCOUNTER — Encounter: Payer: Self-pay | Admitting: Family Medicine

## 2019-02-03 ENCOUNTER — Ambulatory Visit (INDEPENDENT_AMBULATORY_CARE_PROVIDER_SITE_OTHER): Payer: Medicare Other | Admitting: Family Medicine

## 2019-02-03 ENCOUNTER — Ambulatory Visit: Payer: Medicare Other | Admitting: Physical Therapy

## 2019-02-03 ENCOUNTER — Encounter: Payer: Self-pay | Admitting: Occupational Therapy

## 2019-02-03 ENCOUNTER — Other Ambulatory Visit: Payer: Self-pay

## 2019-02-03 ENCOUNTER — Ambulatory Visit: Payer: Medicare Other | Admitting: Occupational Therapy

## 2019-02-03 VITALS — BP 160/64 | HR 81 | Temp 98.6°F | Ht 62.0 in | Wt 168.0 lb

## 2019-02-03 DIAGNOSIS — R278 Other lack of coordination: Secondary | ICD-10-CM

## 2019-02-03 DIAGNOSIS — S329XXD Fracture of unspecified parts of lumbosacral spine and pelvis, subsequent encounter for fracture with routine healing: Secondary | ICD-10-CM

## 2019-02-03 DIAGNOSIS — M6281 Muscle weakness (generalized): Secondary | ICD-10-CM

## 2019-02-03 DIAGNOSIS — G8929 Other chronic pain: Secondary | ICD-10-CM

## 2019-02-03 DIAGNOSIS — N183 Chronic kidney disease, stage 3 unspecified: Secondary | ICD-10-CM

## 2019-02-03 DIAGNOSIS — I69354 Hemiplegia and hemiparesis following cerebral infarction affecting left non-dominant side: Secondary | ICD-10-CM

## 2019-02-03 DIAGNOSIS — E119 Type 2 diabetes mellitus without complications: Secondary | ICD-10-CM | POA: Diagnosis not present

## 2019-02-03 DIAGNOSIS — R2689 Other abnormalities of gait and mobility: Secondary | ICD-10-CM

## 2019-02-03 DIAGNOSIS — E559 Vitamin D deficiency, unspecified: Secondary | ICD-10-CM

## 2019-02-03 DIAGNOSIS — R2681 Unsteadiness on feet: Secondary | ICD-10-CM

## 2019-02-03 DIAGNOSIS — I10 Essential (primary) hypertension: Secondary | ICD-10-CM

## 2019-02-03 DIAGNOSIS — G8194 Hemiplegia, unspecified affecting left nondominant side: Secondary | ICD-10-CM

## 2019-02-03 DIAGNOSIS — D509 Iron deficiency anemia, unspecified: Secondary | ICD-10-CM

## 2019-02-03 DIAGNOSIS — M25512 Pain in left shoulder: Secondary | ICD-10-CM

## 2019-02-03 DIAGNOSIS — E785 Hyperlipidemia, unspecified: Secondary | ICD-10-CM

## 2019-02-03 DIAGNOSIS — I639 Cerebral infarction, unspecified: Secondary | ICD-10-CM

## 2019-02-03 DIAGNOSIS — M25612 Stiffness of left shoulder, not elsewhere classified: Secondary | ICD-10-CM

## 2019-02-03 MED ORDER — LOSARTAN POTASSIUM 25 MG PO TABS: 25.0000 mg | ORAL_TABLET | Freq: Two times a day (BID) | ORAL | 3 refills | Status: DC

## 2019-02-03 NOTE — Assessment & Plan Note (Signed)
Not well controlled- increase cozaar to 25 mg twice daily.  Her daughter checks her BP several times a day and will keep me updated.

## 2019-02-03 NOTE — Therapy (Signed)
[facility] [address] [place], [place], [address] Phone: [phone]   Fax:  [phone]  Physical Therapy Treatment  Patient Details  Name: [name] MRN: [id number] Date of Birth: [birth year] Referring Provider (PT): Alysia Penna, MD   Encounter Date: 02/03/2019   CLINIC OPERATION CHANGES: Outpatient Neuro Rehab is open at lower capacity following universal masking, social distancing, and patient screening.  The patient's COVID risk of complications score is 5.   PT End of Session - 02/03/19 1238    Visit Number  16    Number of Visits  21    Date for PT Re-Evaluation  02/19/19    Authorization Type  [employer] Medicare, 10th visit progress note    PT Start Time  0715    PT Stop Time  0800    PT Time Calculation (min)  45 min    Equipment Utilized During Treatment  --   Requested no gait belt due to it creating a rash in the past   Activity Tolerance  Patient tolerated treatment well    Behavior During Therapy  WFL for tasks assessed/performed       Past Medical History:  Diagnosis Date  . Allergy   . Arthritis   . Cataract   . Diabetes mellitus without complication ([place])   . Hypertension   . Macular degeneration syndrome    with edema---being treated.     Past Surgical History:  Procedure Laterality Date  . callous removal  Left 2017   located on 1st digit on L foot 2/2 diabetes  . EYE SURGERY      There were no vitals filed for this visit.  Subjective Assessment - 02/03/19 0716    Subjective  Daughter never realized how much stretching patient needs.  Thinks patient's posture is better with stretching.    Pertinent History  Patient had a R CVA 02/2018 and was walking with a SPC prior to a fall in March 2020 resulting in being hospitalized with multiple pelvic fxs and a L hip fx. During hospital stay, patient acquired pressure ulcers on bilateral soles of feet near heels.Patient is now safely  able to bear weight and pressure ulcers are now a stage 1 (per daughter report and photos).    Patient Stated Goals  "Be back to normal, get back to walking, and getting strength back"    Currently in Pain?  No/denies    Pain Onset  More than a month ago                       [facility] Adult PT Treatment/Exercise - 02/03/19 0903      Bed Mobility   Rolling Right  Supervision/verbal cueing    Rolling Right Details (indicate cue type and reason)  with rolling to L and R x 2 reps focused on upper trunk flexion and rotation following lower trunk rotation for full roll to side.  Therapist utilized visual cues (reach for my hand) to facilitate upper trunk rotation.  Greater difficulty rolling to L side due to reporting discomfort in ribs on L side    Rolling Left  Supervision/Verbal cueing      Transfers   Transfers  Sit to Stand;Stand to Sit;Stand Pivot Transfers;Supine to Sit;Sit to Supine    Sit to Stand  5: Supervision    Sit to Stand Details (indicate cue type and reason)  with extra time and multiple attempts to stand using UE without therapist  assistance    Stand to Sit  5: Supervision    Stand Pivot Transfers  4: Min guard    Stand Pivot Transfer Details (indicate cue type and reason)  from w/c > mat without use of UE but pt utilizing UE support on arm rest of w/c and on mat; did not make full pivot    Supine to Sit  4: Min assist    Supine to Sit Details (indicate cue type and reason)  cues for full roll to L side and to maintain upper trunk rotation when pushing from side to sit    Sit to Supine  4: Min assist    Sit to Supine Details   assistance to come down on to side while lifting each LE on to the mat due to tendency to roll posteriorly      Ambulation/Gait   Ambulation/Gait  Yes    Ambulation/Gait Assistance  4: Min guard    Ambulation/Gait Assistance Details  decreased tactile and verbal cues required today to maintain upright posture and full step length with LLE;  pt able to self correct    Ambulation Distance (Feet)  50 Feet    Assistive device  Rollator    Gait Pattern  Step-through pattern;Decreased step length - left;Decreased dorsiflexion - left    Ambulation Surface  Level;Indoor      Therapeutic Activites    Therapeutic Activities  Other Therapeutic Activities    Other Therapeutic Activities  Standing with staggered stance at counter with L foot back and L hand on towel on counter top focused on pushing L hand forwards and to the L to facilitate terminal hip extension and diagonal weight shifting to simulate gait sequence.  Transitioned to R foot forwards, L foot back and placing RUE on tall cabinet reaching up the cabinet to facilitate full anterior and R weight shift for gait x 8 reps each side.  Transitioned to feet parallel and performing "rainbow" arcs with LUE and then RUE on counter top with upper trunk rotation (contralateral UE stable on counter) and to increase L anterior shoulder ROM for more upright posture; 8 reps to each side      Lumbar Exercises: Sidelying   Other Sidelying Lumbar Exercises  in R sidelying performed lateral trunk stretch with therapist manually depressing pelvis and pushing upwards on ribs for elongation, 2 x 30 seconds      Knee/Hip Exercises: Stretches   Hip Flexor Stretch  Right;Left;1 rep;60 seconds    Hip Flexor Stretch Limitations  supine with LE off side of mat with single UE raise over head; recommended that pt and daughter             PT Education - 02/03/19 1237    Education Details  reviewed hip flexor stretch again    Person(s) Educated  Patient;Child(ren)    Methods  Explanation;Demonstration    Comprehension  Verbalized understanding;Returned demonstration       PT Short Term Goals - 01/22/19 0905      PT SHORT TERM GOAL #1   Title  Patient will be independent with initial HEP focusing on bilateral LE strength and balance to build upon gains made in therapy.    Time  4    Period  Weeks     Status  Achieved    Target Date  01/20/19      PT SHORT TERM GOAL #2   Title  Patient will increase BERG score to at least a 20/56 in order to  help decrease risk of future falls.    Baseline  21/56 on 01/20/19    Time  4    Period  Weeks    Status  Achieved    Target Date  01/20/19      PT SHORT TERM GOAL #3   Title  Patient will perform sit <> stand with min A from therapist and no UE support in order to help decrease caregiver burden at home during transfers.    Baseline  Min assist-supervision with no UE assist    Time  4    Period  Weeks    Status  Achieved    Target Date  01/20/19      PT SHORT TERM GOAL #4   Title  Patient will ambulate 100 ft with LRAD and CGA from therapist in order to improve household functional mobility.    Baseline  100 ft with rollator with min assist-01/20/19    Time  4    Period  Weeks    Status  Partially Met    Target Date  01/20/19      PT SHORT TERM GOAL #5   Title  Patient will participate in further assessment of gait speed.    Baseline  .4 ft/sec    Time  4    Period  Weeks    Status  Achieved    Target Date  01/20/19      PT SHORT TERM GOAL #6   Title  Patient will perform all bed mobility with supervision in order to decrease caregiver burden and increase independence.    Baseline  Pt still requiring min assist    Time  4    Period  Weeks    Status  Not Met    Target Date  01/20/19        PT Long Term Goals - 01/22/19 0905      PT LONG TERM GOAL #1   Title  Patient will be independent with final HEP in order to continue building upon functional gains made in therapy.  (DUE 02/19/19)    Time  8    Period  Weeks    Status  New      PT LONG TERM GOAL #2   Title  Patient will ambulate at least 150 feet with LRAD and supervision in order to improve functional mobility in the household and community.    Baseline  115' with min A from therapist with rollator    Time  8    Period  Weeks    Status  Revised      PT LONG  TERM GOAL #3   Title  Patient will increase BERG score to at least 26/56 in order to decrease risk of future falls.    Baseline  21/56    Time  8    Period  Weeks    Status  Revised      PT LONG TERM GOAL #4   Title  Patient will perform functional transfers and sit <> stands with mod I in order to decrease caregiver burden.    Baseline  supervision-min A sit <> stand and stand pivot    Time  8    Period  Weeks    Status  Revised      PT LONG TERM GOAL #5   Title  Patient will increase gait speed to at least 1.0 ft/sec with LRAD in order to decrease risk of falls and improve household ambulation.  Baseline  .21 ft/sec with RW and rollator    Time  8    Period  Weeks    Status  Revised      PT LONG TERM GOAL #6   Title  Patient will ascend/descend 3 steps using LRAD with supervision in order to enter/exit home without use of a ramp.    Time  8    Period  Weeks    Status  New            Plan - 02/03/19 1238    Clinical Impression Statement  Continued to review stretching for pt and daughter to perform at home to improve patient's posture and available ROM for standing and gait.  Also performed functional weight shifting with UE in WB positions at counter top.  Pt is demonstrating significant improvement in ability to weight shift anteriorly for more independent sit > stand, for full weight shift during gait and demonstrates improve LE clearance and step through gait pattern with decreased hands on assistance from therapist.  Will continue to address in order to continue to progress towards LTG.    PT Treatment/Interventions  ADLs/Self Care Home Management;Gait training;DME Instruction;Stair training;Functional mobility training;Therapeutic activities;Patient/family education;Neuromuscular re-education;Balance training;Therapeutic exercise;Passive range of motion    PT Next Visit Plan  Stair negotiation training.  Gluteal activation in standing (wall bumps, sit<>stand).  Increased  walking in clinic encouraging carryover to home and entering/exiting clinic.  Postural stretching/strengthening for improved upright posture.  Weight shifting facilitating knee extension and hip anterior translation during mid stance phase of gait.    Consulted and Agree with Plan of Care  Patient;Family member/caregiver    Family Member Consulted  Daughter, [name]       Patient will benefit from skilled therapeutic intervention in order to improve the following deficits and impairments:  Abnormal gait, Decreased activity tolerance, Decreased balance, Decreased range of motion, Decreased mobility, Decreased endurance, Decreased skin integrity, Decreased strength, Difficulty walking, Impaired perceived functional ability, Postural dysfunction  Visit Diagnosis: 1. Hemiplegia and hemiparesis following cerebral infarction affecting left non-dominant side ([place])   2. Unsteadiness on feet   3. Other abnormalities of gait and mobility   4. Muscle weakness (generalized)        Problem List Patient Active Problem List   Diagnosis Date Noted  . Pelvic fracture ([place]) 09/21/2018  . Allergic reaction caused by a drug 05/06/2018  . Bilateral leg edema 04/13/2018  . HLD (hyperlipidemia) 03/23/2018  . Mood swings 03/23/2018  . Chronic kidney disease (CKD), stage III (moderate) (HCC)   . Embolic stroke ([place]) 57/26/2035  . Left hemiparesis ([place])   . Diabetes mellitus type 2 in nonobese (HCC)   . Acute ischemic stroke ([place])   . Left arm weakness   . Acute CVA (cerebrovascular accident) ([place]) 02/24/2018  . Macular edema due to secondary diabetes ([place]) 02/24/2018  . Overweight (BMI 25.0-29.9) 02/24/2018  . CKD (chronic kidney disease), stage III ([place]) 02/24/2018  . Diabetes mellitus without complication ([place]) 59/74/1638  . HTN (hypertension) 09/02/2017  . Knee pain, bilateral 09/02/2017    Rico Junker, PT, DPT 02/03/19    12:44 PM    [facility] [address] [place], [place], [address] Phone: [phone]   Fax:  [phone]  Name: [name] MRN: [id number] Date of Birth: Nov 30, 1945

## 2019-02-03 NOTE — Assessment & Plan Note (Signed)
Lipid panel today

## 2019-02-03 NOTE — Assessment & Plan Note (Signed)
Diet controlled. Repeat a1c today.

## 2019-02-03 NOTE — Assessment & Plan Note (Signed)
Repeat renal function today. 

## 2019-02-03 NOTE — Therapy (Signed)
[facility] [address] [place], [place], [address] Phone: [phone]   Fax:  [phone]  Occupational Therapy Treatment  Patient Details  Name: [name] MRN: [id number] Date of Birth: 1945/08/23 Referring Provider (OT): Dr. Letta Pate   Encounter Date: 02/03/2019  OT End of Session - 02/03/19 0737    Visit Number  9    Number of Visits  17    Date for OT Re-Evaluation  02/16/19    Authorization Type  [employer] MCR    Authorization - Visit Number  9    Authorization - Number of Visits  10    OT Start Time  0800    OT Stop Time  0845    OT Time Calculation (min)  45 min    Activity Tolerance  Patient tolerated treatment well    Behavior During Therapy  [facility] for tasks assessed/performed       Past Medical History:  Diagnosis Date  . Allergy   . Arthritis   . Cataract   . Diabetes mellitus without complication ([place])   . Hypertension   . Macular degeneration syndrome    with edema---being treated.     Past Surgical History:  Procedure Laterality Date  . callous removal  Left 2017   located on 1st digit on L foot 2/2 diabetes  . EYE SURGERY      There were no vitals filed for this visit.  Subjective Assessment - 02/03/19 0737    Subjective   tried buttoning but buttons were too small, tried ziplock bag and it was easy, been picking up cups.  Pt reports that she feels that she is improving    Patient is accompanied by:  Family member    Pertinent History  CVA 02/27/18, pelvic fx and Lt hip  fx 09/2018. PMH: HTN, OA    Limitations  Fall risk, wounds on feet    Currently in Pain?  No/denies        CLINIC OPERATION CHANGES: Outpatient Neuro Rehab is open at lower capacity following universal masking, social distancing, and patient screening.  The patient's COVID risk of complications score is 5.   In sitting, AAROM shoulder flex and abduction with BUEs with ball with min-mod facilitation/cueing for  normal movement patterns, scapular facilitation, posture.    Standing, bilateral UE activities to incr LUE functional use with min cueing to use LUE more, washing hands with cueing to turn water on/off with LUE, get soap with LUE, incr use of LUE with washing/drying and reaching for paper towel.  Washing dishes with min facilitation/cueing for incr L side activation for standing balance/posture and incr LUE use for bilateral task.     Sitting, Functional reaching LUE to pick up and place coins in containers with low-mid range functional reach incorporating lateral, cross body reaching for incr core activation and coordination.  Wt. Bearing through LUE with hand on mat and body on arm movements with min-mod cueing/min facilitation for incr scapular stability and tricep activation, core activation and forward/lateral wt. shift.          OT Education - 02/03/19 [phone]    Education Details  Recommended that pt stand to brush teeth, wash face to incr activity tolerance and LUE functional use (educated pt in ways to incr use by turning on water, etc    Person(s) Educated  Patient;Child(ren)    Methods  Explanation    Comprehension  Verbalized understanding       OT Short  Term Goals - 01/27/19 1025      OT SHORT TERM GOAL #1   Title  Independent with initial HEP for shoulder ROM, and functional reaching LUE - due 01/22/19    Time  4    Period  Weeks    Status  Achieved      OT SHORT TERM GOAL #2   Title  Pt to perform UB dressing with set up only and LE dressing with mod assist or less using A/E prn    Time  4    Period  Weeks    Status  Achieved      OT SHORT TERM GOAL #3   Title  Pt to increase LUE functional use as evidenced by performing 20 blocks on Box & Blocks test    Baseline  11 blocks    Time  4    Period  Weeks    Status  On-going   01/27/19:  11 blocks     OT SHORT TERM GOAL #4   Title  Pt to perform toileting/clothes management with only min assist    Period  Weeks     Status  Achieved   Able to complete - simulation -  in clinic.  01/27/19:  met per pt/dtr report     OT SHORT TERM GOAL #5   Title  Pt to achieve 90* shoulder flexion LUE for mid level reaching    Time  4    Period  Weeks    Status  On-going   01/18/19:  75* with min compensation       OT Long Term Goals - 12/17/18 0925      OT LONG TERM GOAL #1   Title  Independent with updated HEP - due 02/16/19    Time  8    Period  Weeks    Status  New      OT LONG TERM GOAL #2   Title  Pt to be mod I with all BADLS using A/E as needed (except shower transfers to still be supervision)    Time  8    Period  Weeks    Status  New      OT LONG TERM GOAL #3   Title  Pt to improve LUE function as evidenced by performing 25 blocks or greater on Box & Blocks test    Time  8    Period  Weeks    Status  New      OT LONG TERM GOAL #4   Title  Pt to improve coordination as evidenced by performing 9 hole peg test completely in 90 sec or less    Baseline  placed 5 in 2 min.    Time  8    Period  Weeks    Status  New      OT LONG TERM GOAL #5   Title  Grip strength Lt hand to be 15 lbs or greater    Baseline  8 lbs    Time  8    Period  Weeks    Status  New      Long Term Additional Goals   Additional Long Term Goals  Yes      OT LONG TERM GOAL #6   Title  Pt to perform simple snack, cold food, and microwaveable food mod I level with DME prn without LOB    Time  8    Period  Weeks    Status  New  OT LONG TERM GOAL #7   Title  Pt to achieve 110* or greater P/ROM Lt shoulder with pain less than or equal to 5/10    Time  8    Period  Weeks    Status  New            Plan - 02/03/19 1607    Clinical Impression Statement  Pt is progressing with improving LUE functional movement and control as well as incr activity tolerance.  Pt reports trying to use LUE more at home.  Pt is making slow, steady progress towards goals.    OT Frequency  2x / week    OT Duration  8 weeks    OT  Treatment/Interventions  Self-care/ADL training;Therapeutic exercise;Functional Mobility Training;Neuromuscular education;Aquatic Therapy;Manual Therapy;Splinting;Therapeutic activities;Paraffin;DME and/or AE instruction;Cognitive remediation/compensation;Electrical Stimulation;Visual/perceptual remediation/compensation;Moist Heat;Passive range of motion;Patient/family education    Plan  LUE/trunk Neuro re-ed, LUE functional use, **progress note next visit    Consulted and Agree with Plan of Care  Patient;Family member/caregiver    Family Member Consulted  Daughter Unk Lightning)       Patient will benefit from skilled therapeutic intervention in order to improve the following deficits and impairments:           Visit Diagnosis: 1. Hemiplegia and hemiparesis following cerebral infarction affecting left non-dominant side (HCC)   2. Other lack of coordination   3. Stiffness of left shoulder, not elsewhere classified   4. Chronic left shoulder pain   5. Other abnormalities of gait and mobility   6. Unsteadiness on feet   7. Muscle weakness (generalized)       Problem List Patient Active Problem List   Diagnosis Date Noted  . Lethargy 10/07/2018  . Pressure injury of skin 09/27/2018  . Pelvic fracture ([place]) 09/21/2018  . Allergic reaction caused by a drug 05/06/2018  . Bilateral leg edema 04/13/2018  . HLD (hyperlipidemia) 03/23/2018  . Mood swings 03/23/2018  . Chronic kidney disease (CKD), stage III (moderate) (HCC)   . Embolic stroke ([place]) 37/04/6268  . Left hemiparesis ([place])   . Diabetes mellitus type 2 in nonobese (HCC)   . Acute ischemic stroke ([place])   . Left arm weakness   . Acute CVA (cerebrovascular accident) ([place]) 02/24/2018  . 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 Overweight (BMI 25.0-29.9) 02/24/2018  . CKD (chronic kidney disease), stage III ([place]) 02/24/2018  . Diabetes mellitus without complication ([place]) 48/54/6270  . HTN (hypertension)  09/02/2017  . Knee pain, bilateral 09/02/2017    [facility] 02/03/2019, 8:59 AM  [facility] [address] [place], [place], [address] Phone: [phone]   Fax:  [phone]  Name: [name] MRN: [id number] Date of Birth: 1946/03/21   Vianne Bulls, OTR/L [facility] [address] [place], [place]  [address] [phone] phone [phone] 02/03/19 8:59 AM

## 2019-02-03 NOTE — Patient Instructions (Signed)
Great to see you. I will call you with your lab results from today and you can view them online.   We are increasing your losartan to 25 mg twice daily but okay to hold a dose if your blood pressure (140s/90) is normal or low.

## 2019-02-03 NOTE — Assessment & Plan Note (Signed)
Likely related to kidney disease. On OTC iron.  Repeat CBC, ferritin today.

## 2019-02-03 NOTE — Progress Notes (Signed)
Subjective:   Patient ID: [name], female    DOB: 02/17/1946, 73 y.o.   MRN: [id number]  [name] is a pleasant 73 y.o. year old female who presents to clinic today with Follow-up (Pt and daughter screened at vehicle. Pt is here today with her daughter as pt is in a wheelchair. She is going to PT 3 times weekly and making progress. Her foot wounds have been healing but all is slow going.  Would like to recheck CKD labs.) and Diabetes (Pt is also F/U with DM.  Last A1C was great at 6.2 on 3.17.20.)  on 02/03/2019  HPI:  Follow up-  DM- Diet controlled. Checks FSBS daily- fasting in 100s.  Lab Results  Component Value Date   HGBA1C 6.2 (H) 09/22/2018   HTN- She is on ARB but BP has not been well controlled.  Taking Cozaar 25 mg daily.  CKD- Lab Results  Component Value Date   CREATININE 1.71 (H) 09/25/2018    From a stroke standpoint, she is doing well- going to OT and PT- had both this morning.  Regaining strength and mobility in her upper body, particularly the right side that was impacted by the stroke.  Anemia- has been taking iron. Lab Results  Component Value Date   WBC 6.9 09/25/2018   HGB 9.3 (L) 09/25/2018   HCT 29.1 (L) 09/25/2018   MCV 86.4 09/25/2018   PLT 236 09/25/2018     Current Outpatient Medications on File Prior to Visit  Medication Sig Dispense Refill  . acetaminophen (TYLENOL) 650 MG CR tablet Take 1 tablet (650 mg total) by mouth every 6 (six) hours as needed for pain. 120 tablet 3  . aspirin EC 81 MG tablet Take 81 mg by mouth daily.    Marland Kitchen BLACK CURRANT SEED OIL PO Take 1 capsule by mouth daily.    . blood glucose meter kit and supplies KIT Dispense based on patient and insurance preference. UAD bid for glucose monitoring Dx: E11.9 1 each 0  . collagenase (SANTYL) ointment Apply 1 application topically daily. Measurement: 2 x 3 x 0.1cm, and 5 x 4 x 0.1cm B/L heels 15 g 5  . fexofenadine (ALLEGRA) 180 MG tablet Take 180 mg by mouth  daily.    Marland Kitchen glucose blood test strip UAD bid for glucose monitoring Dx: E11.9 100 each 12  . Lancet Device MISC UAD bid for glucose monitoring Dx: E11.9 1 each prn  . losartan (COZAAR) 25 MG tablet Take 1 tablet (25 mg total) by mouth daily. 30 tablet 3  . methocarbamol (ROBAXIN) 500 MG tablet Take 1 tablet (500 mg total) by mouth every 8 (eight) hours as needed for muscle spasms. 20 tablet 0  . oxycodone (OXY-IR) 5 MG capsule Take 1 capsule (5 mg total) by mouth every 4 (four) hours as needed. 30 capsule 0   No current facility-administered medications on file prior to visit.     Allergies  Allergen Reactions  . Clonidine Anaphylaxis, Swelling and Other (See Comments)    Tongue swelling  . Norvasc [Amlodipine Besylate] Swelling    Edema and TONGUE swelling   . Lisinopril Cough  . Amlodipine Anxiety, Nausea Only and Swelling  . Codeine Itching and Rash    Past Medical History:  Diagnosis Date  . Allergy   . Arthritis   . Cataract   . Diabetes mellitus without complication ([place])   . Hypertension   . Macular degeneration syndrome    with edema---being  treated.     Past Surgical History:  Procedure Laterality Date  . callous removal  Left 2017   located on 1st digit on L foot 2/2 diabetes  . EYE SURGERY      Family History  Problem Relation Age of Onset  . Kidney disease Mother   . Congestive Heart Failure Father   . COPD Father   . COPD Brother   . Diabetes Maternal Aunt     Social History   Socioeconomic History  . Marital status: Married    Spouse name: Rennis Harding  . Number of children: 2  . 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 Highest education level: High school graduate  Occupational History  . Not on file  Social Needs  . Financial resource strain: Somewhat hard  . Food insecurity    Worry: Never true    Inability: Never true  . Transportation needs    Medical: No    Non-medical: No  Tobacco Use  . Smoking  status: Never Smoker  . Smokeless tobacco: Never Used  Substance and Sexual Activity  . Alcohol use: No  . Drug use: No  . Sexual activity: Not Currently  Lifestyle  . Physical activity    Days per week: 0 days    Minutes per session: 0 min  . Stress: Not at all  Relationships  . Social connections    Talks on phone: More than three times a week    Gets together: Twice a week    Attends religious service: Never    Active member of club or organization: No    Attends meetings of clubs or organizations: Never    Relationship status: Married  . Intimate partner violence    Fear of current or ex partner: No    Emotionally abused: No    Physically abused: No    Forced sexual activity: No  Other Topics Concern  . Not on file  Social History Narrative  . Not on file   The PMH, PSH, Social History, Family History, Medications, and allergies have been reviewed in [facility], and have been updated if relevant.   Review of Systems  Constitutional: Negative.   HENT: Negative.   Respiratory: Negative.   Cardiovascular: Negative.   Gastrointestinal: Negative.   Endocrine: Negative.   Genitourinary: Negative.   Musculoskeletal: Negative.   Allergic/Immunologic: Negative.   Neurological: Positive for weakness.  Hematological: Negative.   Psychiatric/Behavioral: Negative.   All other systems reviewed and are negative.      Objective:    BP (!) 160/64 (BP Location: Right Arm, Patient Position: Sitting, Cuff Size: Normal)   Pulse 81   Temp 98.6 F (37 C) (Oral)   Ht _0  (1.575 m)   Wt 168 lb (76.2 kg)   SpO2 94%   BMI 30.73 kg/m   Wt Readings from Last 3 Encounters:  02/03/19 168 lb (76.2 kg)  12/31/18 160 lb (72.6 kg)  12/21/18 165 lb 8 oz (75.1 kg)    Physical Exam Vitals signs and nursing note reviewed.  Constitutional:      Appearance: Normal appearance.  HENT:     Head: Normocephalic.     Right Ear: External ear normal.     Left Ear: External ear normal.      Mouth/Throat:     Mouth: Mucous membranes are moist.  Eyes:     Extraocular Movements: Extraocular movements intact.  Cardiovascular:     Rate and  Rhythm: Normal rate.     Pulses: Normal pulses.     Heart sounds: Normal heart sounds.  Pulmonary:     Effort: Pulmonary effort is normal.     Breath sounds: Normal breath sounds.  Musculoskeletal: Normal range of motion.        General: No swelling.  Skin:    General: Skin is warm and dry.  Neurological:     Mental Status: She is alert.     Comments: Improved grip strength bilaterally  Psychiatric:        Mood and Affect: Mood normal.        Behavior: Behavior normal.        Thought Content: Thought content normal.        Judgment: Judgment normal.           Assessment & Plan:   Diabetes mellitus type 2 in nonobese (HCC) - Plan: HgB A1c, Urine Microalbumin w/creat. ratio,  CKD (chronic kidney disease), stage III ([place]) - Plan: Comp Met (CMET), CBC w/Diff, Urine Microalbumin w/creat. ratio,  Closed nondisplaced fracture of pelvis with routine healing, unspecified part of pelvis, subsequent encounter - Plan: VITAMIN D 25 Hydroxy (Vit-D Deficiency, Fractures), Vitamin D deficiency - Plan: VITAMIN D 25 Hydroxy (Vit-D Deficiency, Fractures),  Essential hypertension -  Chronic kidney disease (CKD), stage III (moderate) (HCC) -  Hyperlipidemia, unspecified hyperlipidemia type -   Diabetes mellitus without complication ([place]) - No follow-ups on file.

## 2019-02-03 NOTE — Patient Instructions (Signed)
Access Code: [email or web address]  URL: [email or web address]  Date: 02/03/2019  Prepared by: Misty Stanley   Exercises Sit to Stand with Counter Support - 10 reps - 1 sets - 1x daily - 5x weekly Standing March with Counter Support - 10 reps - 1 sets - 1x daily - 5x weekly Mini Squat with Counter Support - 10 reps - 1 sets - 1x daily - 5x weekly Supine Bridge with Resistance Band - 10 reps - 1 sets - 1x daily - 5x weekly 90/90 Lower Trunk Rotation - 10 reps - 2 sets - 1x daily - 7x weekly Standing Gastroc Stretch on Step with Counter Support - 4 sets - 30 seconds hold - 1x daily - 7x weekly Supine Single Knee to Chest - 3 reps - 1 sets - 30 seconds hold - 1x daily - 7x weekly Seated Hamstring Stretch - 3 reps - 1 sets - 30 sec hold - 2x daily - 7x weekly Modified Thomas Stretch - 2 sets - 60 second hold - 1x daily - 7x weekly Seated Piriformis Stretch - 3 reps - 1 sets - 30 sec hold - 2x daily - 7x weekly

## 2019-02-03 NOTE — Assessment & Plan Note (Signed)
Improving with PT/OT.

## 2019-02-04 LAB — FERRITIN: Ferritin: 133 ng/mL (ref 16–288)

## 2019-02-05 ENCOUNTER — Encounter: Payer: Self-pay | Admitting: Physical Therapy

## 2019-02-05 ENCOUNTER — Ambulatory Visit: Payer: Medicare Other | Admitting: Physical Therapy

## 2019-02-05 ENCOUNTER — Other Ambulatory Visit: Payer: Self-pay

## 2019-02-05 DIAGNOSIS — I69354 Hemiplegia and hemiparesis following cerebral infarction affecting left non-dominant side: Secondary | ICD-10-CM

## 2019-02-05 DIAGNOSIS — R2689 Other abnormalities of gait and mobility: Secondary | ICD-10-CM

## 2019-02-05 DIAGNOSIS — M6281 Muscle weakness (generalized): Secondary | ICD-10-CM

## 2019-02-05 DIAGNOSIS — R2681 Unsteadiness on feet: Secondary | ICD-10-CM

## 2019-02-05 NOTE — Therapy (Signed)
[name] [address] [place], [place], [address] Phone: [phone]   Fax:  [phone]  Physical Therapy Treatment  Patient Details  Name: [name] MRN: [id number] Date of Birth: 09/02/1945 Referring Provider (PT): Alysia Penna, MD   Encounter Date: 02/05/2019   CLINIC OPERATION CHANGES: [facility] is open at lower capacity following universal masking, social distancing, and patient screening.  The patient's COVID risk of complications score is 6.   PT End of Session - 02/05/19 0918    Visit Number  17    Number of Visits  21    Date for PT Re-Evaluation  02/19/19    Authorization Type  [employer] Medicare, 10th visit progress note    PT Start Time  0700    PT Stop Time  0745    PT Time Calculation (min)  45 min    Equipment Utilized During Treatment  --   Requested no gait belt due to it creating a rash in the past   Activity Tolerance  Patient tolerated treatment well    Behavior During Therapy  WFL for tasks assessed/performed       Past Medical History:  Diagnosis Date  . Allergy   . Arthritis   . Cataract   . Diabetes mellitus without complication ([place])   . Hypertension   . Macular degeneration syndrome    with edema---being treated.     Past Surgical History:  Procedure Laterality Date  . callous removal  Left 2017   located on 1st digit on L foot 2/2 diabetes  . EYE SURGERY      There were no vitals filed for this visit.  Subjective Assessment - 02/05/19 0908    Subjective  Asking to review hip flexor stretch but with both LE down off edge of bed - daughter reports this is still how they perform it at home but it hurts the patient.    Pertinent History  Patient had a R CVA 02/2018 and was walking with a SPC prior to a fall in March 2020 resulting in being hospitalized with multiple pelvic fxs and a L hip fx. During hospital stay, patient acquired pressure ulcers on bilateral  soles of feet near heels.Patient is now safely able to bear weight and pressure ulcers are now a stage 1 (per daughter report and photos).    Patient Stated Goals  "Be back to normal, get back to walking, and getting strength back"    Currently in Pain?  No/denies    Pain Onset  More than a month ago                       [facility] Adult PT Treatment/Exercise - 02/05/19 0909      Ambulation/Gait   Ambulation/Gait  Yes    Ambulation/Gait Assistance  4: Min guard    Ambulation/Gait Assistance Details  verbal cues for heel strike and increased step length bilaterally; still min A to control speed and distance of rollator from patient    Ambulation Distance (Feet)  75 Feet    Assistive device  Rollator    Gait Pattern  Step-through pattern;Decreased step length - left;Decreased dorsiflexion - left;Decreased step length - right;Right foot flat;Left foot flat;Trunk flexed    Ambulation Surface  Level;Indoor    Stairs  Yes    Stairs Assistance  4: Min assist;3: Mod assist    Stairs Assistance Details (indicate cue type and reason)  performed x 2  sets forwards using bilat rails.  First set performed step to sequence for ascend and descend.  Second set performed reciprocal pattern to ascend, step to when descending.  Therapist provided facilitation for forward weight shifting after advancing each LE to next step, verbal cues for sequencing and assistance to advance LLE.  When descending pt tends to rotate whole body to the R and keeps L hip posterior.    Stair Management Technique  Two rails;Alternating pattern;Step to pattern;Forwards    Number of Stairs  8    Height of Stairs  6      Lumbar Exercises: Stretches   Hip Flexor Stretch  Right;Left;1 rep;60 seconds    Hip Flexor Stretch Limitations  modified Thomas hip flexor stretch on side of mat; placed lumbar roll at low back and brought one foot up supported on mat to rotate pelvis posteriorly in order to gradually stretch each hip  flexor mm.      Other Lumbar Stretch Exercise  Forward flexion seated for lumbar stretch x 30 seconds; also performed seated lateral sidebending stretch reaching down to the outside of each LE for more lateral trunk and QL stretch, 30 seconds each side             PT Education - 02/05/19 0917    Education Details  modified Marcello Moores test hip flexor stretch to avoid moving from sidelying R <> sidelying L EOB; stair negotiation training    Person(s) Educated  Patient;Child(ren)    Methods  Explanation;Demonstration    Comprehension  Verbalized understanding;Returned demonstration       PT Short Term Goals - 01/22/19 0905      PT SHORT TERM GOAL #1   Title  Patient will be independent with initial HEP focusing on bilateral LE strength and balance to build upon gains made in therapy.    Time  4    Period  Weeks    Status  Achieved    Target Date  01/20/19      PT SHORT TERM GOAL #2   Title  Patient will increase BERG score to at least a 20/56 in order to help decrease risk of future falls.    Baseline  21/56 on 01/20/19    Time  4    Period  Weeks    Status  Achieved    Target Date  01/20/19      PT SHORT TERM GOAL #3   Title  Patient will perform sit <> stand with min A from therapist and no UE support in order to help decrease caregiver burden at home during transfers.    Baseline  Min assist-supervision with no UE assist    Time  4    Period  Weeks    Status  Achieved    Target Date  01/20/19      PT SHORT TERM GOAL #4   Title  Patient will ambulate 100 ft with LRAD and CGA from therapist in order to improve household functional mobility.    Baseline  100 ft with rollator with min assist-01/20/19    Time  4    Period  Weeks    Status  Partially Met    Target Date  01/20/19      PT SHORT TERM GOAL #5   Title  Patient will participate in further assessment of gait speed.    Baseline  .47 ft/sec    Time  4    Period  Weeks    Status  Achieved  Target Date  01/20/19       PT SHORT TERM GOAL #6   Title  Patient will perform all bed mobility with supervision in order to decrease caregiver burden and increase independence.    Baseline  Pt still requiring min assist    Time  4    Period  Weeks    Status  Not Met    Target Date  01/20/19        PT Long Term Goals - 01/22/19 0905      PT LONG TERM GOAL #1   Title  Patient will be independent with final HEP in order to continue building upon functional gains made in therapy.  (DUE 02/19/19)    Time  8    Period  Weeks    Status  New      PT LONG TERM GOAL #2   Title  Patient will ambulate at least 150 feet with LRAD and supervision in order to improve functional mobility in the household and community.    Baseline  115' with min A from therapist with rollator    Time  8    Period  Weeks    Status  Revised      PT LONG TERM GOAL #3   Title  Patient will increase BERG score to at least 26/56 in order to decrease risk of future falls.    Baseline  21/56    Time  8    Period  Weeks    Status  Revised      PT LONG TERM GOAL #4   Title  Patient will perform functional transfers and sit <> stands with mod I in order to decrease caregiver burden.    Baseline  supervision-min A sit <> stand and stand pivot    Time  8    Period  Weeks    Status  Revised      PT LONG TERM GOAL #5   Title  Patient will increase gait speed to at least 1.0 ft/sec with LRAD in order to decrease risk of falls and improve household ambulation.    Baseline  .78 ft/sec with RW and rollator    Time  8    Period  Weeks    Status  Revised      PT LONG TERM GOAL #6   Title  Patient will ascend/descend 3 steps using LRAD with supervision in order to enter/exit home without use of a ramp.    Time  8    Period  Weeks    Status  New            Plan - 02/05/19 3704    Clinical Impression Statement  Continued to review and utilize LE and trunk stretches to improve available ROM for posture and gait training.  Continued  gait training with rollator with pt only requiring verbal cues for heel strike today.  Also performed stair negotiation training focusing on use of bilat rails and negotiating forwards with step to and then reciprocal pattern.  Pt requires increased assistance if ascending with LLE, to attend to placement of LUE and for forward weight shift when descending.  Will continue to address in order to progress towards LTG.    PT Treatment/Interventions  ADLs/Self Care Home Management;Gait training;DME Instruction;Stair training;Functional mobility training;Therapeutic activities;Patient/family education;Neuromuscular re-education;Balance training;Therapeutic exercise;Passive range of motion    PT Next Visit Plan  Step ups and step downs in // bars for further stair negotiation training.  Gluteal activation  in standing (wall bumps, sit<>stand).  Increased walking in clinic encouraging carryover to home and entering/exiting clinic.  Postural stretching/strengthening for improved upright posture.  Weight shifting facilitating knee extension and hip anterior translation during mid stance phase of gait.    Consulted and Agree with Plan of Care  Patient;Family member/caregiver    Family Member Consulted  Daughter, [name]       Patient will benefit from skilled therapeutic intervention in order to improve the following deficits and impairments:  Abnormal gait, Decreased activity tolerance, Decreased balance, Decreased range of motion, Decreased mobility, Decreased endurance, Decreased skin integrity, Decreased strength, Difficulty walking, Impaired perceived functional ability, Postural dysfunction  Visit Diagnosis: 1. Hemiplegia and hemiparesis following cerebral infarction affecting left non-dominant side ([place])   2. Unsteadiness on feet   3. Other abnormalities of gait and mobility   4. Muscle weakness (generalized)        Problem List Patient Active Problem List   Diagnosis Date Noted  . Iron deficiency  anemia 02/03/2019  . Pelvic fracture ([place]) 09/21/2018  . Allergic reaction caused by a drug 05/06/2018  . Bilateral leg edema 04/13/2018  . HLD (hyperlipidemia) 03/23/2018  . Mood swings 03/23/2018  . Chronic kidney disease (CKD), stage III (moderate) (H[facility])   . Embolic stroke ([place]) 00/76/2263  . Left hemiparesis ([place])   . Diabetes mellitus type 2 in nonobese (HCC)   . Acute ischemic stroke ([place])   . Left arm weakness   . Acute CVA (cerebrovascular accident) ([place]) 02/24/2018  . 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 Overweight (BMI 25.0-29.9) 02/24/2018  . CKD (chronic kidney disease), stage III ([place]) 02/24/2018  . Diabetes mellitus without complication ([place]) 33/54/5625  . HTN (hypertension) 09/02/2017  . Knee pain, bilateral 09/02/2017    Rico Junker, PT, DPT 02/05/19    9:23 AM    [facility] [address] [place], [place], [address] Phone: [phone]   Fax:  [phone]  Name: [name] MRN: [id number] Date of Birth: Dec 17, 1945

## 2019-02-08 ENCOUNTER — Encounter: Payer: Self-pay | Admitting: Family Medicine

## 2019-02-09 ENCOUNTER — Ambulatory Visit: Payer: Medicare Other | Attending: Physical Medicine & Rehabilitation | Admitting: Physical Therapy

## 2019-02-09 ENCOUNTER — Ambulatory Visit: Payer: Medicare Other | Admitting: Occupational Therapy

## 2019-02-09 ENCOUNTER — Other Ambulatory Visit: Payer: Self-pay

## 2019-02-09 DIAGNOSIS — M25512 Pain in left shoulder: Secondary | ICD-10-CM | POA: Insufficient documentation

## 2019-02-09 DIAGNOSIS — G8929 Other chronic pain: Secondary | ICD-10-CM | POA: Insufficient documentation

## 2019-02-09 DIAGNOSIS — R2689 Other abnormalities of gait and mobility: Secondary | ICD-10-CM | POA: Insufficient documentation

## 2019-02-09 DIAGNOSIS — M6281 Muscle weakness (generalized): Secondary | ICD-10-CM | POA: Diagnosis present

## 2019-02-09 DIAGNOSIS — R2681 Unsteadiness on feet: Secondary | ICD-10-CM | POA: Insufficient documentation

## 2019-02-09 DIAGNOSIS — M25612 Stiffness of left shoulder, not elsewhere classified: Secondary | ICD-10-CM | POA: Insufficient documentation

## 2019-02-09 DIAGNOSIS — I69354 Hemiplegia and hemiparesis following cerebral infarction affecting left non-dominant side: Secondary | ICD-10-CM | POA: Diagnosis present

## 2019-02-09 DIAGNOSIS — R278 Other lack of coordination: Secondary | ICD-10-CM | POA: Diagnosis present

## 2019-02-09 NOTE — Patient Instructions (Signed)
1. Grip Strengthening (Resistive Putty)   Squeeze putty using thumb and all fingers. Repeat _20___ times. Do __2__ sessions per day.   2. Roll putty into tube on table and pinch between first two fingers and thumb x 10 reps. Do 2 sessions per day     Copyright  VHI. All rights reserved.     

## 2019-02-09 NOTE — Therapy (Signed)
[facility]  [address] [place], [place], [address] Phone: [phone]   Fax:  [phone]  Physical Therapy Treatment  Patient Details  Name: [name] MRN: [id number] Date of Birth: 03/06/1946 Referring Provider (PT): Alysia Penna, MD     Encounter Date: 02/09/2019  PT End of Session - 02/09/19 1141    Visit Number  18    Number of Visits  21    Date for PT Re-Evaluation  02/19/19    Authorization Type  [employer] Medicare, 10th visit progress note    PT Start Time  0930    PT Stop Time  1015    PT Time Calculation (min)  45 min    Equipment Utilized During Treatment  --   Requested no gait belt due to it creating a rash in the past   Activity Tolerance  Patient tolerated treatment well    Behavior During Therapy  [facility] for tasks assessed/performed       Past Medical History:  Diagnosis Date  . Allergy   . Arthritis   . Cataract   . Diabetes mellitus without complication ([place])   . Hypertension   . Macular degeneration syndrome    with edema---being treated.     Past Surgical History:  Procedure Laterality Date  . callous removal  Left 2017   located on 1st digit on L foot 2/2 diabetes  . EYE SURGERY      There were no vitals filed for this visit.  Subjective Assessment - 02/09/19 0931    Subjective  Is doing good today. Getting stronger everyday.    Pertinent History  Patient had a R CVA 02/2018 and was walking with a SPC prior to a fall in March 2020 resulting in being hospitalized with multiple pelvic fxs and a L hip fx. During hospital stay, patient acquired pressure ulcers on bilateral soles of feet near heels.Patient is now safely able to bear weight and pressure ulcers are now a stage 1 (per daughter report and photos).    Patient Stated Goals  "Be back to normal, get back to walking, and getting strength back"    Pain Onset  More than a month ago                       [facility]  Adult PT Treatment/Exercise - 02/09/19 0001      Bed Mobility   Bed Mobility  Sit to Supine;Rolling Right;Right Sidelying to Sit    Rolling Right  Supervision/verbal cueing    Rolling Right Details (indicate cue type and reason)  Therapist providing visual cueing of having pt reach for therapist's hand with LUE in order to facilitate upper trunk rotation for rolling     Right Sidelying to Sit  Minimal Assistance - Patient > 75%    Supine to Sit  Supervision/Verbal cueing   improved bridging technique    Sit to Supine - Details (indicate cue type and reason)  R sidelying > sit, therapist providing verbal cueing for proper RUE placement to push through trunk and R shoulder, min A from therapist to pt's L pelvis to facilitate weight shifting. Able to perform without holding onto rollator (to mimic a rail)      Transfers   Transfers  Sit to Stand;Stand to Sit    Stand Pivot Transfers  4: Min guard    Stand Pivot Transfer Details (indicate cue type and reason)  with rollator from mat table > w/c.  cueing for advancing feet and taking larger steps. Pt's LLE has tendency to freeze with pt unable to or takes increased time to lift off ground to advance foot    Comments  Sit > mini squat holds 8 reps x10 seconds, rollator in front of pt with pt's BUE holding onto seat, cueing to shift weight towards therapist on L before lifting glutes and using handle of rollator as visual cue to anteriorly shift weight. Cueing for eccentric control when lowering back to mat table.       Ambulation/Gait   Ambulation/Gait  Yes    Ambulation/Gait Assistance  4: Min assist;4: Min guard    Ambulation/Gait Assistance Details  Verbal cueing for heel > toe pattern, larger step length, cueing for upright posture. Intermittent min A for rollator d/t pt has tendency to push it forwrad anteriorly. Daughter with w/c follow d/t pt reporting increased fatigue today     Ambulation Distance (Feet)  115 Feet   in 2 bouts. brief seated  rest break    Assistive device  Rollator    Gait Pattern  Step-through pattern;Decreased step length - left;Decreased dorsiflexion - left;Decreased step length - right;Right foot flat;Left foot flat;Trunk flexed    Ambulation Surface  Level;Indoor      Dynamic Standing Balance   Forward lean/weight shifting comments:  Sitting at edge of blue mat table with wedge placed under R hip to facilitate weight shifting towards L, with smaller red physioball under L elbow, weightshifting laterally towards L with focus on elongation of L lateral trunk, hold for 15-20 seconds for a stretch before returning back to midline. Mirror in front of pt for visual feedback, cueing for upright posture and proper trunk alignment.       Neuro Re-ed    Neuro Re-ed Details   in R sidelying performed pelvis PNF patterns for anterior elevation/posterior depression of pelvis to assist with R swing phase of gait. x20 second hold in posterior depression. When asking pt to help assist therapist with movement, pt unable to coordinate movement, especially with R hip hiking             PT Education - 02/09/19 1119    Education Details  Gait training with rollator, role of anterior weight shifting in sit <> stands    Person(s) Educated  Patient;Child(ren)    Methods  Explanation;Demonstration    Comprehension  Verbalized understanding;Returned demonstration       PT Short Term Goals - 01/22/19 0905      PT SHORT TERM GOAL #1   Title  Patient will be independent with initial HEP focusing on bilateral LE strength and balance to build upon gains made in therapy.    Time  4    Period  Weeks    Status  Achieved    Target Date  01/20/19      PT SHORT TERM GOAL #2   Title  Patient will increase BERG score to at least a 20/56 in order to help decrease risk of future falls.    Baseline  21/56 on 01/20/19    Time  4    Period  Weeks    Status  Achieved    Target Date  01/20/19      PT SHORT TERM GOAL #3   Title   Patient will perform sit <> stand with min A from therapist and no UE support in order to help decrease caregiver burden at home during transfers.    Baseline  Min assist-supervision  with no UE assist    Time  4    Period  Weeks    Status  Achieved    Target Date  01/20/19      PT SHORT TERM GOAL #4   Title  Patient will ambulate 100 ft with LRAD and CGA from therapist in order to improve household functional mobility.    Baseline  100 ft with rollator with min assist-01/20/19    Time  4    Period  Weeks    Status  Partially Met    Target Date  01/20/19      PT SHORT TERM GOAL #5   Title  Patient will participate in further assessment of gait speed.    Baseline  .51 ft/sec    Time  4    Period  Weeks    Status  Achieved    Target Date  01/20/19      PT SHORT TERM GOAL #6   Title  Patient will perform all bed mobility with supervision in order to decrease caregiver burden and increase independence.    Baseline  Pt still requiring min assist    Time  4    Period  Weeks    Status  Not Met    Target Date  01/20/19        PT Long Term Goals - 01/22/19 0905      PT LONG TERM GOAL #1   Title  Patient will be independent with final HEP in order to continue building upon functional gains made in therapy.  (DUE 02/19/19)    Time  8    Period  Weeks    Status  New      PT LONG TERM GOAL #2   Title  Patient will ambulate at least 150 feet with LRAD and supervision in order to improve functional mobility in the household and community.    Baseline  115' with min A from therapist with rollator    Time  8    Period  Weeks    Status  Revised      PT LONG TERM GOAL #3   Title  Patient will increase BERG score to at least 26/56 in order to decrease risk of future falls.    Baseline  21/56    Time  8    Period  Weeks    Status  Revised      PT LONG TERM GOAL #4   Title  Patient will perform functional transfers and sit <> stands with mod I in order to decrease caregiver burden.     Baseline  supervision-min A sit <> stand and stand pivot    Time  8    Period  Weeks    Status  Revised      PT LONG TERM GOAL #5   Title  Patient will increase gait speed to at least 1.0 ft/sec with LRAD in order to decrease risk of falls and improve household ambulation.    Baseline  .57 ft/sec with RW and rollator    Time  8    Period  Weeks    Status  Revised      PT LONG TERM GOAL #6   Title  Patient will ascend/descend 3 steps using LRAD with supervision in order to enter/exit home without use of a ramp.    Time  8    Period  Weeks    Status  New  Plan - 02/09/19 1133    Clinical Impression Statement  Focus of today's skilled session was improving ROM of L lateral trunk and quadratus lumborum to assist with anterior pelvic elevation during swing phase of gait, with pt reporting improved mobility during gait with rollator. Pt req intermittent min A today for rollator, d/t tendency to hold it too far anteriorly when starting to fatigue. Pt with continued improvement to anteriorly shift weight during sit <> stands, but continues to have difficulty shifting weight to LLE while coming to stand. Will continue to progress towards LTGS.    PT Treatment/Interventions  ADLs/Self Care Home Management;Gait training;DME Instruction;Stair training;Functional mobility training;Therapeutic activities;Patient/family education;Neuromuscular re-education;Balance training;Therapeutic exercise;Passive range of motion    PT Next Visit Plan  Upgrade HEP (pt states it is too easy). Stand pivot transfers. Step ups and step downs in // bars for further stair negotiation training.  Gluteal activation in standing (wall bumps, sit<>stand).  Increased walking in clinic encouraging carryover to home and entering/exiting clinic.  Postural stretching/strengthening for improved upright posture.  Weight shifting facilitating knee extension and hip anterior translation during mid stance phase of gait.     Consulted and Agree with Plan of Care  Patient;Family member/caregiver    Family Member Consulted  Daughter, [name]       Patient will benefit from skilled therapeutic intervention in order to improve the following deficits and impairments:  Abnormal gait, Decreased activity tolerance, Decreased balance, Decreased range of motion, Decreased mobility, Decreased endurance, Decreased skin integrity, Decreased strength, Difficulty walking, Impaired perceived functional ability, Postural dysfunction  Visit Diagnosis: 1. Hemiplegia and hemiparesis following cerebral infarction affecting left non-dominant side ([place])   2. Unsteadiness on feet   3. Other abnormalities of gait and mobility   4. Muscle weakness (generalized)   5. Other lack of coordination        Problem List Patient Active Problem List   Diagnosis Date Noted  . Iron deficiency anemia 02/03/2019  . Pelvic fracture ([place]) 09/21/2018  . Allergic reaction caused by a drug 05/06/2018  . Bilateral leg edema 04/13/2018  . HLD (hyperlipidemia) 03/23/2018  . Mood swings 03/23/2018  . Chronic kidney disease (CKD), stage III (moderate) (HCC)   . Embolic stroke ([place]) 98/42/1031  . Left hemiparesis ([place])   . Diabetes mellitus type 2 in nonobese (HCC)   . Acute ischemic stroke ([place])   . Left arm weakness   . Acute CVA (cerebrovascular accident) ([place]) 02/24/2018  . Macular edema due to secondary diabetes ([place]) 02/24/2018  . Overweight (BMI 25.0-29.9) 02/24/2018  . CKD (chronic kidney disease), stage III ([place]) 02/24/2018  . Diabetes mellitus without complication ([place]) 28/05/8866  . HTN (hypertension) 09/02/2017  . Knee pain, bilateral 09/02/2017    Arliss Journey, PT, DPT 02/09/2019, 2:14 PM  [facility] [address] [place], [place], [address] Phone: [phone]   Fax:  [phone]  Name: [name] MRN: [id number] Date of Birth: 10/22/1945

## 2019-02-09 NOTE — Therapy (Signed)
[facility] [address] [place], [place], [address] Phone: [phone]   Fax:  [phone]  Occupational Therapy Treatment  Patient Details  Name: [name] MRN: [id number] Date of Birth: [birth year] Referring Provider (OT): Dr. Letta Pate   Encounter Date: 02/09/2019  OT End of Session - 02/09/19 1442    Visit Number  10    Number of Visits  17    Date for OT Re-Evaluation  02/16/19    Authorization Type  [employer] MCR    Authorization Time Period  WEEK 7/8    Authorization - Visit Number  10    Authorization - Number of Visits  10    OT Start Time  0845    OT Stop Time  0930    OT Time Calculation (min)  45 min    Activity Tolerance  Patient tolerated treatment well    Behavior During Therapy  [facility] for tasks assessed/performed       Past Medical History:  Diagnosis Date  . Allergy   . Arthritis   . Cataract   . Diabetes mellitus without complication ([place])   . Hypertension   . Macular degeneration syndrome    with edema---being treated.     Past Surgical History:  Procedure Laterality Date  . callous removal  Left 2017   located on 1st digit on L foot 2/2 diabetes  . EYE SURGERY      There were no vitals filed for this visit.  Subjective Assessment - 02/09/19 0847    Patient is accompanied by:  Family member   daughter   Pertinent History  CVA 02/27/18, pelvic fx and Lt hip  fx 09/2018. PMH: HTN, OA    Limitations  Fall risk, wounds on feet    Currently in Pain?  No/denies       Pt issued putty HEP - see pt instructions for details. Grip strength approx 5 lbs today (less than at eval)  Transfer w/c to mat with min assist. Worked on simulated LE dressing for trunk control, functional use of BUE's, even wt through LE's for transitional sit to/from stand movements, and balance w/ min assist.  Also worked on posture (including head positioning) and increasing wt over Lt buttocks and crossing midline  with RUE. Pt leans to Rt side and relies heavily on RUE although pt can support herself w/o RUE. Worked on wt bearing over LUE while seated. Pt also needs to work on lateral Lt trunk activation.                      OT Education - 02/09/19 0902    Education Details  Putty HEP    Person(s) Educated  Patient;Child(ren)    Methods  Explanation;Demonstration;Handout;Verbal cues    Comprehension  Verbalized understanding;Returned demonstration;Verbal cues required       OT Short Term Goals - 01/27/19 0738      OT SHORT TERM GOAL #1   Title  Independent with initial HEP for shoulder ROM, and functional reaching LUE - due 01/22/19    Time  4    Period  Weeks    Status  Achieved      OT SHORT TERM GOAL #2   Title  Pt to perform UB dressing with set up only and LE dressing with mod assist or less using A/E prn    Time  4    Period  Weeks    Status  Achieved  OT SHORT TERM GOAL #3   Title  Pt to increase LUE functional use as evidenced by performing 20 blocks on Box & Blocks test    Baseline  11 blocks    Time  4    Period  Weeks    Status  On-going   01/27/19:  11 blocks     OT SHORT TERM GOAL #4   Title  Pt to perform toileting/clothes management with only min assist    Period  Weeks    Status  Achieved   Able to complete - simulation -  in clinic.  01/27/19:  met per pt/dtr report     OT SHORT TERM GOAL #5   Title  Pt to achieve 90* shoulder flexion LUE for mid level reaching    Time  4    Period  Weeks    Status  On-going   01/18/19:  75* with min compensation       OT Long Term Goals - 12/17/18 0925      OT LONG TERM GOAL #1   Title  Independent with updated HEP - due 02/16/19    Time  8    Period  Weeks    Status  New      OT LONG TERM GOAL #2   Title  Pt to be mod I with all BADLS using A/E as needed (except shower transfers to still be supervision)    Time  8    Period  Weeks    Status  New      OT LONG TERM GOAL #3   Title  Pt to improve  LUE function as evidenced by performing 25 blocks or greater on Box & Blocks test    Time  8    Period  Weeks    Status  New      OT LONG TERM GOAL #4   Title  Pt to improve coordination as evidenced by performing 9 hole peg test completely in 90 sec or less    Baseline  placed 5 in 2 min.    Time  8    Period  Weeks    Status  New      OT LONG TERM GOAL #5   Title  Grip strength Lt hand to be 15 lbs or greater    Baseline  8 lbs    Time  8    Period  Weeks    Status  New      Long Term Additional Goals   Additional Long Term Goals  Yes      OT LONG TERM GOAL #6   Title  Pt to perform simple snack, cold food, and microwaveable food mod I level with DME prn without LOB    Time  8    Period  Weeks    Status  New      OT LONG TERM GOAL #7   Title  Pt to achieve 110* or greater P/ROM Lt shoulder with pain less than or equal to 5/10    Time  8    Period  Weeks    Status  New            Plan - 02/09/19 1444    Clinical Impression Statement  Pt progressing slowly towards goals. Pt leans to Rt side when seated unsupported and needs encouragement to cross midline to Lt side    Occupational performance deficits (Please refer to evaluation for details):  ADL's;IADL's;Leisure    Body  Structure / Function / Physical Skills  ADL;ROM;Balance;IADL;Body mechanics;Improper spinal/pelvic alignment;Mobility;Strength;Flexibility;FMC;Coordination;UE functional use;Proprioception;Decreased knowledge of use of DME    Rehab Potential  Good    Comorbidities impacting occupational performance description:  Wounds bilateral feet, Healed recent hip and pelvic fx's    OT Frequency  2x / week    OT Duration  8 weeks    OT Treatment/Interventions  Self-care/ADL training;Therapeutic exercise;Functional Mobility Training;Neuromuscular education;Aquatic Therapy;Manual Therapy;Splinting;Therapeutic activities;Paraffin;DME and/or AE instruction;Cognitive remediation/compensation;Electrical  Stimulation;Visual/perceptual remediation/compensation;Moist Heat;Passive range of motion;Patient/family education    Plan  LUE/trunk Neuro re-ed, LUE functional use, will need renewal next week    Consulted and Agree with Plan of Care  Patient;Family member/caregiver    Family Member Consulted  Daughter Unk Lightning)       Patient will benefit from skilled therapeutic intervention in order to improve the following deficits and impairments:   Body Structure / Function / Physical Skills: ADL, ROM, Balance, IADL, Body mechanics, Improper spinal/pelvic alignment, Mobility, Strength, Flexibility, FMC, Coordination, UE functional use, Proprioception, Decreased knowledge of use of DME       Visit Diagnosis: 1. Hemiplegia and hemiparesis following cerebral infarction affecting left non-dominant side ([place])   2. Muscle weakness (generalized)   3. Unsteadiness on feet       Problem List Patient Active Problem List   Diagnosis Date Noted  . Iron deficiency anemia 02/03/2019  . Pelvic fracture ([place]) 09/21/2018  . Allergic reaction caused by a drug 05/06/2018  . Bilateral leg edema 04/13/2018  . HLD (hyperlipidemia) 03/23/2018  . Mood swings 03/23/2018  . Chronic kidney disease (CKD), stage III (moderate) (HCC)   . Embolic stroke ([place]) 57/07/7791  . Left hemiparesis ([place])   . Diabetes mellitus type 2 in nonobese (HCC)   . Acute ischemic stroke ([place])   . Left arm weakness   . Acute CVA (cerebrovascular accident) ([place]) 02/24/2018  . 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 Overweight (BMI 25.0-29.9) 02/24/2018  . CKD (chronic kidney disease), stage III ([place]) 02/24/2018  . Diabetes mellitus without complication ([place]) 90/30/0923  . HTN (hypertension) 09/02/2017  . Knee pain, bilateral 09/02/2017   Occupational Therapy Progress Note  Dates of Reporting Period: 12/17/18 to 02/09/19  Objective Reports of Subjective Statement: see above   Objective Measurements: see goals. Lt  grip = 5 lbs. Transfers w/ min assist and cues  Goal Update: see above  Plan: see above  Reason Skilled Services are Required: to increase independence with ADLS, improve posture, LUE function, and balance.   Carey Bullocks, OTR/L 02/09/2019, 2:48 PM  [facility] [address] [place], [place], [address] Phone: [phone]   Fax:  [phone]  Name: [name] MRN: [id number] Date of Birth: 1946-05-17

## 2019-02-10 ENCOUNTER — Encounter: Payer: Self-pay | Admitting: Rehabilitative and Restorative Service Providers"

## 2019-02-10 ENCOUNTER — Ambulatory Visit: Payer: Medicare Other | Admitting: Rehabilitative and Restorative Service Providers"

## 2019-02-10 ENCOUNTER — Ambulatory Visit: Payer: Medicare Other | Admitting: Family Medicine

## 2019-02-10 ENCOUNTER — Encounter: Payer: Self-pay | Admitting: Family Medicine

## 2019-02-10 ENCOUNTER — Ambulatory Visit: Payer: Medicare Other | Admitting: Occupational Therapy

## 2019-02-10 DIAGNOSIS — R2681 Unsteadiness on feet: Secondary | ICD-10-CM

## 2019-02-10 DIAGNOSIS — I69354 Hemiplegia and hemiparesis following cerebral infarction affecting left non-dominant side: Secondary | ICD-10-CM | POA: Diagnosis not present

## 2019-02-10 DIAGNOSIS — M25612 Stiffness of left shoulder, not elsewhere classified: Secondary | ICD-10-CM

## 2019-02-10 DIAGNOSIS — R2689 Other abnormalities of gait and mobility: Secondary | ICD-10-CM

## 2019-02-10 DIAGNOSIS — M6281 Muscle weakness (generalized): Secondary | ICD-10-CM

## 2019-02-10 LAB — CBC WITH DIFFERENTIAL/PLATELET
Absolute Monocytes: 699 cells/uL (ref 200–950)
Basophils Absolute: 91 cells/uL (ref 0–200)
Basophils Relative: 1.2 %
Eosinophils Absolute: 403 cells/uL (ref 15–500)
Eosinophils Relative: 5.3 %
HCT: 32.6 % — ABNORMAL LOW (ref 35.0–45.0)
Hemoglobin: 10.9 g/dL — ABNORMAL LOW (ref 11.7–15.5)
Lymphs Abs: 1778 cells/uL (ref 850–3900)
MCH: 28.9 pg (ref 27.0–33.0)
MCHC: 33.4 g/dL (ref 32.0–36.0)
MCV: 86.5 fL (ref 80.0–100.0)
MPV: 11.6 fL (ref 7.5–12.5)
Monocytes Relative: 9.2 %
Neutro Abs: 4628 cells/uL (ref 1500–7800)
Neutrophils Relative %: 60.9 %
Platelets: 263 10*3/uL (ref 140–400)
RBC: 3.77 10*6/uL — ABNORMAL LOW (ref 3.80–5.10)
RDW: 13.9 % (ref 11.0–15.0)
Total Lymphocyte: 23.4 %
WBC: 7.6 10*3/uL (ref 3.8–10.8)

## 2019-02-10 LAB — BASIC METABOLIC PANEL WITH GFR
BUN/Creatinine Ratio: 20 (calc) (ref 6–22)
BUN: 33 mg/dL — ABNORMAL HIGH (ref 7–25)
CO2: 22 mmol/L (ref 20–32)
Calcium: 9.2 mg/dL (ref 8.6–10.4)
Chloride: 105 mmol/L (ref 98–110)
Creat: 1.65 mg/dL — ABNORMAL HIGH (ref 0.60–0.93)
GFR, Est African American: 35 mL/min/{1.73_m2} — ABNORMAL LOW (ref >=60)
GFR, Est Non African American: 30 mL/min/{1.73_m2} — ABNORMAL LOW (ref >=60)
Glucose, Bld: 195 mg/dL — ABNORMAL HIGH (ref 65–99)
Potassium: 4.3 mmol/L (ref 3.5–5.3)
Sodium: 137 mmol/L (ref 135–146)

## 2019-02-10 LAB — COMPREHENSIVE METABOLIC PANEL
AG Ratio: 1.8 (calc) (ref 1.0–2.5)
ALT: 16 U/L (ref 6–29)
AST: 13 U/L (ref 10–35)
Albumin: 3.7 g/dL (ref 3.6–5.1)
Alkaline phosphatase (APISO): 79 U/L (ref 37–153)
BUN/Creatinine Ratio: 20 (calc) (ref 6–22)
BUN: 33 mg/dL — ABNORMAL HIGH (ref 7–25)
CO2: 22 mmol/L (ref 20–32)
Calcium: 9.2 mg/dL (ref 8.6–10.4)
Chloride: 105 mmol/L (ref 98–110)
Creat: 1.65 mg/dL — ABNORMAL HIGH (ref 0.60–0.93)
Globulin: 2.1 g/dL (calc) (ref 1.9–3.7)
Glucose, Bld: 195 mg/dL — ABNORMAL HIGH (ref 65–99)
Potassium: 4.3 mmol/L (ref 3.5–5.3)
Sodium: 137 mmol/L (ref 135–146)
Total Bilirubin: 0.3 mg/dL (ref 0.2–1.2)
Total Protein: 5.8 g/dL — ABNORMAL LOW (ref 6.1–8.1)

## 2019-02-10 LAB — MICROALBUMIN / CREATININE URINE RATIO
Creatinine, Urine: 24 mg/dL (ref 20–275)
Microalb Creat Ratio: 100 mcg/mg creat — ABNORMAL HIGH (ref <30)
Microalb, Ur: 2.4 mg/dL

## 2019-02-10 LAB — HEMOGLOBIN A1C
Hgb A1c MFr Bld: 6.6 % of total Hgb — ABNORMAL HIGH (ref <5.7)
Mean Plasma Glucose: 143 (calc)
eAG (mmol/L): 7.9 (calc)

## 2019-02-10 LAB — VITAMIN D 25 HYDROXY (VIT D DEFICIENCY, FRACTURES): Vit D, 25-Hydroxy: 39 ng/mL (ref 30–100)

## 2019-02-10 NOTE — Therapy (Signed)
[facility] [address] [place], [place], [address] Phone: [phone]   Fax:  [phone]  Occupational Therapy Treatment  Patient Details  Name: [name] MRN: [id number] Date of Birth: 1946/03/28 Referring Provider (OT): Dr. Letta Pate   Encounter Date: 02/10/2019  OT End of Session - 02/10/19 1040    Visit Number  11    Number of Visits  17    Date for OT Re-Evaluation  02/16/19    Authorization Type  [employer] MCR    Authorization Time Period  WEEK 7/8    Authorization - Visit Number  11    Authorization - Number of Visits  20    OT Start Time  0845    OT Stop Time  0930    OT Time Calculation (min)  45 min    Activity Tolerance  Patient tolerated treatment well    Behavior During Therapy  [facility] for tasks assessed/performed       Past Medical History:  Diagnosis Date  . Allergy   . Arthritis   . Cataract   . Diabetes mellitus without complication ([place])   . Hypertension   . Macular degeneration syndrome    with edema---being treated.     Past Surgical History:  Procedure Laterality Date  . callous removal  Left 2017   located on 1st digit on L foot 2/2 diabetes  . EYE SURGERY      There were no vitals filed for this visit.  Subjective Assessment - 02/10/19 0851    Pertinent History  CVA 02/27/18, pelvic fx and Lt hip  fx 09/2018. PMH: HTN, OA    Limitations  Fall risk, wounds on feet    Currently in Pain?  No/denies        Pt/family shown trunk extension stretch in supine w/ towel roll. Educated daugther on making sure towel roll is along center of spine and least amount of pillows for neck as possible.  Seated: worked on anterior/posterior pelvic tilts w/ BUE's disengaged. Also worked on wt bearing through [place] while cross reaching [place] to shift wt over to Lt side, followed by RUE ipsilateral high level reaching to engage Lt side trunk activation. Then worked on bilateral AA/ROM abduction w/ arms  supported on moving surfaces for trunk stretch and LUE stretch.   Pt/family encouraged to work on towel stretch, A/P pelvic tilts, and wt bearing LUE w/ RUE reaching ex's at home.                      OT Short Term Goals - 01/27/19 3151      OT SHORT TERM GOAL #1   Title  Independent with initial HEP for shoulder ROM, and functional reaching LUE - due 01/22/19    Time  4    Period  Weeks    Status  Achieved      OT SHORT TERM GOAL #2   Title  Pt to perform UB dressing with set up only and LE dressing with mod assist or less using A/E prn    Time  4    Period  Weeks    Status  Achieved      OT SHORT TERM GOAL #3   Title  Pt to increase LUE functional use as evidenced by performing 20 blocks on Box & Blocks test    Baseline  11 blocks    Time  4    Period  Weeks    Status  On-going   01/27/19:  11 blocks     OT SHORT TERM GOAL #4   Title  Pt to perform toileting/clothes management with only min assist    Period  Weeks    Status  Achieved   Able to complete - simulation -  in clinic.  01/27/19:  met per pt/dtr report     OT SHORT TERM GOAL #5   Title  Pt to achieve 90* shoulder flexion LUE for mid level reaching    Time  4    Period  Weeks    Status  On-going   01/18/19:  75* with min compensation       OT Long Term Goals - 12/17/18 0925      OT LONG TERM GOAL #1   Title  Independent with updated HEP - due 02/16/19    Time  8    Period  Weeks    Status  New      OT LONG TERM GOAL #2   Title  Pt to be mod I with all BADLS using A/E as needed (except shower transfers to still be supervision)    Time  8    Period  Weeks    Status  New      OT LONG TERM GOAL #3   Title  Pt to improve LUE function as evidenced by performing 25 blocks or greater on Box & Blocks test    Time  8    Period  Weeks    Status  New      OT LONG TERM GOAL #4   Title  Pt to improve coordination as evidenced by performing 9 hole peg test completely in 90 sec or less     Baseline  placed 5 in 2 min.    Time  8    Period  Weeks    Status  New      OT LONG TERM GOAL #5   Title  Grip strength Lt hand to be 15 lbs or greater    Baseline  8 lbs    Time  8    Period  Weeks    Status  New      Long Term Additional Goals   Additional Long Term Goals  Yes      OT LONG TERM GOAL #6   Title  Pt to perform simple snack, cold food, and microwaveable food mod I level with DME prn without LOB    Time  8    Period  Weeks    Status  New      OT LONG TERM GOAL #7   Title  Pt to achieve 110* or greater P/ROM Lt shoulder with pain less than or equal to 5/10    Time  8    Period  Weeks    Status  New            Plan - 02/10/19 1041    Clinical Impression Statement  Pt progressing w/ trunk control and posture today. Pt/daughter w/ greater awareness of this and importance of working on at home.    Occupational performance deficits (Please refer to evaluation for details):  ADL's;IADL's;Leisure    Body Structure / Function / Physical Skills  ADL;ROM;Balance;IADL;Body mechanics;Improper spinal/pelvic alignment;Mobility;Strength;Flexibility;FMC;Coordination;UE functional use;Proprioception;Decreased knowledge of use of DME    Rehab Potential  Good    OT Frequency  2x / week    OT Duration  8 weeks    OT Treatment/Interventions  Self-care/ADL training;Therapeutic exercise;Functional  Mobility Training;Neuromuscular education;Aquatic Therapy;Manual Therapy;Splinting;Therapeutic activities;Paraffin;DME and/or AE instruction;Cognitive remediation/compensation;Electrical Stimulation;Visual/perceptual remediation/compensation;Moist Heat;Passive range of motion;Patient/family education    Plan  continue trunk and LUE neuro re-educ, standing balance for transfers and clothes management for toileting, ADLS, *renewal next week   Consulted and Agree with Plan of Care  Patient;Family member/caregiver    Family Member Consulted  Daughter Unk Lightning)       Patient will benefit  from skilled therapeutic intervention in order to improve the following deficits and impairments:   Body Structure / Function / Physical Skills: ADL, ROM, Balance, IADL, Body mechanics, Improper spinal/pelvic alignment, Mobility, Strength, Flexibility, FMC, Coordination, UE functional use, Proprioception, Decreased knowledge of use of DME       Visit Diagnosis: 1. Hemiplegia and hemiparesis following cerebral infarction affecting left non-dominant side ([place])   2. Muscle weakness (generalized)   3. Unsteadiness on feet   4. Stiffness of left shoulder, not elsewhere classified       Problem List Patient Active Problem List   Diagnosis Date Noted  . Iron deficiency anemia 02/03/2019  . Pelvic fracture ([place]) 09/21/2018  . Allergic reaction caused by a drug 05/06/2018  . Bilateral leg edema 04/13/2018  . HLD (hyperlipidemia) 03/23/2018  . Mood swings 03/23/2018  . Chronic kidney disease (CKD), stage III (moderate) (HCC)   . Embolic stroke ([place]) 92/42/6834  . Left hemiparesis ([place])   . Diabetes mellitus type 2 in nonobese ([facility]CC)   . Acute ischemic stroke ([place])   . Left arm weakness   . Acute CVA (cerebrovascular accident) ([place]) 02/24/2018  . Macular edema due to secondary diabetes ([place]) 02/24/2018  . Overweight (BMI 25.0-29.9) 02/24/2018  . CKD (chronic kidney disease), stage III ([place]) 02/24/2018  . Diabetes mellitus without complication ([place]) 19/62/2297  . HTN (hypertension) 09/02/2017  . Knee pain, bilateral 09/02/2017    Carey Bullocks, OTR/L 02/10/2019, 10:43 AM  [facility] [address] [place], [place], [address] Phone: [phone]   Fax:  [phone]  Name: [name] MRN: [id number] Date of Birth: 1946-06-25

## 2019-02-10 NOTE — Therapy (Addendum)
[facility] [address] [place], [place], [address] Phone: [phone]   Fax:  [phone]  Physical Therapy Treatment  Patient Details  Name: [name] MRN: [id number] Date of Birth: 01-09-1946 Referring Provider (PT): Alysia Penna, MD  CLINIC OPERATION CHANGES: [facility] is open at lower capacity following universal masking, social distancing, and patient screening.  The patient's COVID risk of complications score is 6.  Encounter Date: 02/10/2019  PT End of Session - 02/10/19 0915    Visit Number  19    Number of Visits  21    Date for PT Re-Evaluation  02/19/19    Authorization Type  [employer] Medicare, 10th visit progress note    PT Start Time  0800    PT Stop Time  0845    PT Time Calculation (min)  45 min    Equipment Utilized During Treatment  --   Requested no gait belt due to it creating a rash in the past   Activity Tolerance  Patient tolerated treatment well    Behavior During Therapy  WFL for tasks assessed/performed       Past Medical History:  Diagnosis Date  . Allergy   . Arthritis   . Cataract   . Diabetes mellitus without complication ([place])   . Hypertension   . Macular degeneration syndrome    with edema---being treated.     Past Surgical History:  Procedure Laterality Date  . callous removal  Left 2017   located on 1st digit on L foot 2/2 diabetes  . EYE SURGERY      There were no vitals filed for this visit.  Subjective Assessment - 02/10/19 0802    Subjective  The patient had reported that prior HEP was easy at last visit and meant the ones before they were updated in late July.    Pertinent History  Patient had a R CVA 02/2018 and was walking with a SPC prior to a fall in March 2020 resulting in being hospitalized with multiple pelvic fxs and a L hip fx. During hospital stay, patient acquired pressure ulcers on bilateral soles of feet near heels.Patient is now  safely able to bear weight and pressure ulcers are now a stage 1 (per daughter report and photos).    Patient Stated Goals  "Be back to normal, get back to walking, and getting strength back"    Currently in Pain?  No/denies         [facility] PT Assessment - 02/10/19 0806      Ambulation/Gait   Ambulation/Gait  Yes    Ambulation/Gait Assistance  4: Min guard;4: Min assist    Ambulation/Gait Assistance Details  The patient needs cues for longer stride length, upright posture.  The left foot does not clear when fatigued.    Assistive device  Rollator    Gait Pattern  Step-through pattern;Decreased step length - left;Decreased dorsiflexion - left;Decreased step length - right;Right foot flat;Left foot flat;Trunk flexed    Ambulation Surface  Level;Indoor                   [facility] Adult PT Treatment/Exercise - 02/10/19 0806      Transfers   Transfers  Sit to Stand;Stand to Sit    Sit to Stand  5: Supervision    Sit to Stand Details (indicate cue type and reason)  Cues for scooting to edge of mat, use of L UE, working on placing L foot posterior to  R foot to increase L weight shifting.    Stand to Sit  5: Supervision      Ambulation/Gait   Ambulation Distance (Feet)  115 Feet   x 2 reps     Neuro Re-ed    Neuro Re-ed Details   Standing left weight shifting near parallel bars moving R foot onto 2" step with tactile cues to engage L quads and verbal cues for glut activation.  Moved R foot up/down for weight shifting.  Standing trunk activation moving L foot onto 2" surface.  At steps with R UE support moved L foot onto/off of 6" surface and then stepped up working on loading L side and upright trunk position with min A.  Patient does not extend through L leg until R foot reaches step *PT to work on on lower step to get better elongation.      Exercises   Exercises  Other Exercises    Other Exercises   Thomas test stretch with modification of pillow under thigh, PROM hamstring, PROM  heel cord.      *during rest break from standing activities, performed R and L hip hiking with lateral lean working on core activation.         PT Short Term Goals - 01/22/19 0905      PT SHORT TERM GOAL #1   Title  Patient will be independent with initial HEP focusing on bilateral LE strength and balance to build upon gains made in therapy.    Time  4    Period  Weeks    Status  Achieved    Target Date  01/20/19      PT SHORT TERM GOAL #2   Title  Patient will increase BERG score to at least a 20/56 in order to help decrease risk of future falls.    Baseline  21/56 on 01/20/19    Time  4    Period  Weeks    Status  Achieved    Target Date  01/20/19      PT SHORT TERM GOAL #3   Title  Patient will perform sit <> stand with min A from therapist and no UE support in order to help decrease caregiver burden at home during transfers.    Baseline  Min assist-supervision with no UE assist    Time  4    Period  Weeks    Status  Achieved    Target Date  01/20/19      PT SHORT TERM GOAL #4   Title  Patient will ambulate 100 ft with LRAD and CGA from therapist in order to improve household functional mobility.    Baseline  100 ft with rollator with min assist-01/20/19    Time  4    Period  Weeks    Status  Partially Met    Target Date  01/20/19      PT SHORT TERM GOAL #5   Title  Patient will participate in further assessment of gait speed.    Baseline  .82 ft/sec    Time  4    Period  Weeks    Status  Achieved    Target Date  01/20/19      PT SHORT TERM GOAL #6   Title  Patient will perform all bed mobility with supervision in order to decrease caregiver burden and increase independence.    Baseline  Pt still requiring min assist    Time  4    Period  Weeks  Status  Not Met    Target Date  01/20/19        PT Long Term Goals - 01/22/19 0905      PT LONG TERM GOAL #1   Title  Patient will be independent with final HEP in order to continue building upon  functional gains made in therapy.  (DUE 02/19/19)    Time  8    Period  Weeks    Status  New      PT LONG TERM GOAL #2   Title  Patient will ambulate at least 150 feet with LRAD and supervision in order to improve functional mobility in the household and community.    Baseline  115' with min A from therapist with rollator    Time  8    Period  Weeks    Status  Revised      PT LONG TERM GOAL #3   Title  Patient will increase BERG score to at least 26/56 in order to decrease risk of future falls.    Baseline  21/56    Time  8    Period  Weeks    Status  Revised      PT LONG TERM GOAL #4   Title  Patient will perform functional transfers and sit <> stands with mod I in order to decrease caregiver burden.    Baseline  supervision-min A sit <> stand and stand pivot    Time  8    Period  Weeks    Status  Revised      PT LONG TERM GOAL #5   Title  Patient will increase gait speed to at least 1.0 ft/sec with LRAD in order to decrease risk of falls and improve household ambulation.    Baseline  .67 ft/sec with RW and rollator    Time  8    Period  Weeks    Status  Revised      PT LONG TERM GOAL #6   Title  Patient will ascend/descend 3 steps using LRAD with supervision in order to enter/exit home without use of a ramp.    Time  8    Period  Weeks    Status  New            Plan - 02/10/19 5329    Clinical Impression Statement  The patient continues to need encouragement for increasing walking in the home.  We discussed carryover of activities to home to progress mobility.  PT to check LTGs next week and renew, anticipating ability to reduce to 2x/week and encourage continued carryover of walking to home.    PT Treatment/Interventions  ADLs/Self Care Home Management;Gait training;DME Instruction;Stair training;Functional mobility training;Therapeutic activities;Patient/family education;Neuromuscular re-education;Balance training;Therapeutic exercise;Passive range of motion    PT  Next Visit Plan  CHECK LONG TERM GOALS NEXT WEEK (discussed reduction to 2x/week with patient); Work on extending gait for endurance and mobility, L weight shifting in standing; gluteal activation in standing (wall bumps), encourage home walking, standing posture.    Consulted and Agree with Plan of Care  Patient;Family member/caregiver    Family Member Consulted  Daughter, [name]       Patient will benefit from skilled therapeutic intervention in order to improve the following deficits and impairments:  Abnormal gait, Decreased activity tolerance, Decreased balance, Decreased range of motion, Decreased mobility, Decreased endurance, Decreased skin integrity, Decreased strength, Difficulty walking, Impaired perceived functional ability, Postural dysfunction  Visit Diagnosis: 1. Hemiplegia and hemiparesis following cerebral infarction  affecting left non-dominant side ([place])   2. Muscle weakness (generalized)   3. Unsteadiness on feet   4. Other abnormalities of gait and mobility        Problem List Patient Active Problem List   Diagnosis Date Noted  . Iron deficiency anemia 02/03/2019  . Pelvic fracture ([place]) 09/21/2018  . Allergic reaction caused by a drug 05/06/2018  . Bilateral leg edema 04/13/2018  . HLD (hyperlipidemia) 03/23/2018  . Mood swings 03/23/2018  . Chronic kidney disease (CKD), stage III (moderate) ([facility])   . Embolic stroke ([place]) 73/56/7014  . Left hemiparesis ([place])   . Diabetes mellitus type 2 in nonobese ([facility])   . Acute ischemic stroke ([place])   . Left arm weakness   . Acute CVA (cerebrovascular accident) ([place]) 02/24/2018  . 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 Overweight (BMI 25.0-29.9) 02/24/2018  . CKD (chronic kidney disease), stage III ([place]) 02/24/2018  . Diabetes mellitus without complication ([place]) 05/06/1313  . HTN (hypertension) 09/02/2017  . Knee pain, bilateral 09/02/2017    Aricela Bertagnolli, PT 02/10/2019, 9:23 AM  [facility] [address] [place], [place], [address] Phone: [phone]   Fax:  [phone]  Name: [name] MRN: [id number] Date of Birth: [birth year]

## 2019-02-11 ENCOUNTER — Encounter: Payer: Self-pay | Admitting: Physical Therapy

## 2019-02-11 ENCOUNTER — Other Ambulatory Visit: Payer: Self-pay

## 2019-02-11 ENCOUNTER — Ambulatory Visit: Payer: Medicare Other | Admitting: Physical Therapy

## 2019-02-11 DIAGNOSIS — M6281 Muscle weakness (generalized): Secondary | ICD-10-CM

## 2019-02-11 DIAGNOSIS — R2681 Unsteadiness on feet: Secondary | ICD-10-CM

## 2019-02-11 DIAGNOSIS — I69354 Hemiplegia and hemiparesis following cerebral infarction affecting left non-dominant side: Secondary | ICD-10-CM | POA: Diagnosis not present

## 2019-02-11 DIAGNOSIS — R2689 Other abnormalities of gait and mobility: Secondary | ICD-10-CM

## 2019-02-11 NOTE — Therapy (Signed)
[facility] [address] [place], [place], [address] Phone: [phone]   Fax:  [phone]  Physical Therapy Treatment and 10th visit PN  Patient Details  Name: [name] MRN: [id number] Date of Birth: 1945/08/21 Referring Provider (PT): Alysia Penna, MD   Encounter Date: 02/11/2019   Progress Note Reporting Period 01/20/19 to 02/11/2019  See note below for Objective Data and Assessment of Progress/Goals.    PT End of Session - 02/11/19 1017    Visit Number  20    Number of Visits  21    Date for PT Re-Evaluation  02/19/19    Authorization Type  [employer] Medicare, 10th visit progress note    PT Start Time  0848    PT Stop Time  0932    PT Time Calculation (min)  44 min    Equipment Utilized During Treatment  --    Activity Tolerance  Patient tolerated treatment well    Behavior During Therapy  WFL for tasks assessed/performed       Past Medical History:  Diagnosis Date  . Allergy   . Arthritis   . Cataract   . Diabetes mellitus without complication ([place])   . Hypertension   . Macular degeneration syndrome    with edema---being treated.     Past Surgical History:  Procedure Laterality Date  . callous removal  Left 2017   located on 1st digit on L foot 2/2 diabetes  . EYE SURGERY      There were no vitals filed for this visit.  Subjective Assessment - 02/11/19 0853    Subjective  Went shopping at [employer] yesterday.  Used the transport chair.  Was very fatigued after shopping.    Pertinent History  Patient had a R CVA 02/2018 and was walking with a SPC prior to a fall in March 2020 resulting in being hospitalized with multiple pelvic fxs and a L hip fx. During hospital stay, patient acquired pressure ulcers on bilateral soles of feet near heels.Patient is now safely able to bear weight and pressure ulcers are now a stage 1 (per daughter report and photos).    Patient Stated Goals  "Be back to normal,  get back to walking, and getting strength back"    Currently in Pain?  No/denies                       [facility] Adult PT Treatment/Exercise - 02/11/19 0957      Transfers   Transfers  Sit to Stand;Stand to Sit    Sit to Stand  5: Supervision    Stand to Sit  5: Supervision    Stand Pivot Transfers  5: Supervision    Stand Pivot Transfer Details (indicate cue type and reason)  with rollator      Ambulation/Gait   Ambulation/Gait  Yes    Ambulation/Gait Assistance  5: Supervision;4: Min guard    Ambulation/Gait Assistance Details  PT able to remove support from patient's L side and rollator due to pt demonstrating improved upright posture, improved step length and control of rollator.  Intermittent cues to look upright    Ambulation Distance (Feet)  50 Feet    Assistive device  Rollator    Gait Pattern  Step-through pattern;Decreased step length - left;Decreased dorsiflexion - left;Decreased step length - right;Left foot flat;Right foot flat    Ambulation Surface  Level;Indoor      Knee/Hip Exercises: Stretches   Hip Best boy  Right;Left;1 rep;30 seconds    Hip Flexor Stretch Limitations  standing runner's stretch with one foot on step, second foot in stride back behind step, shifting weight forwards     Gastroc Stretch  Right;Left;2 reps;30 seconds    Gastroc Stretch Limitations  standing with bilat heels off back of step and then single leg off back edge of step      Knee/Hip Exercises: Standing   Step Down  Right;Left;1 set;5 reps;Hand Hold: 2    Step Down Limitations  forward step downs standing on rockerboard to focus on ankle movement during anterior weight shifts.  As pt fatigued she required increased assistance to shift COG back and push to stand on each LE.  On final repetition pt unable to weight shift back over board and began to experience knee buckling; returned to sitting      Knee/Hip Exercises: Supine   Facilities manager;Both    Bridges  Limitations  Demonstrated to pt and daughter how to perform bilat straight leg bridges with feet hanging over wedge to focus on hamstring stretch, hamstring activation for hip extension with straight leg, quad activation for terminal knee extension          Balance Exercises - 02/11/19 1005      Balance Exercises: Standing   Rockerboard  Anterior/posterior;EO;10 reps;UE support    Other Standing Exercises  With rockerboard performing anterior/posterior weight shifting first with bilat UE support on bars and then adding in unilateral reaching up and forwards for increased trunk elongation and hip extension.  Placed patient's LUE on therapist's shoulder and performed active assisted forward weight shifting.        PT Education - 02/11/19 1015    Education Details  added straight leg bridge for terminal hip and knee extension, continue to perform walking at home and gradually increase distances and frequencies    Person(s) Educated  Patient    Methods  Explanation    Comprehension  Need further instruction       PT Short Term Goals - 01/22/19 0905      PT SHORT TERM GOAL #1   Title  Patient will be independent with initial HEP focusing on bilateral LE strength and balance to build upon gains made in therapy.    Time  4    Period  Weeks    Status  Achieved    Target Date  01/20/19      PT SHORT TERM GOAL #2   Title  Patient will increase BERG score to at least a 20/56 in order to help decrease risk of future falls.    Baseline  21/56 on 01/20/19    Time  4    Period  Weeks    Status  Achieved    Target Date  01/20/19      PT SHORT TERM GOAL #3   Title  Patient will perform sit <> stand with min A from therapist and no UE support in order to help decrease caregiver burden at home during transfers.    Baseline  Min assist-supervision with no UE assist    Time  4    Period  Weeks    Status  Achieved    Target Date  01/20/19      PT SHORT TERM GOAL #4   Title  Patient will  ambulate 100 ft with LRAD and CGA from therapist in order to improve household functional mobility.    Baseline  100 ft with rollator with min assist-01/20/19  Time  4    Period  Weeks    Status  Partially Met    Target Date  01/20/19      PT SHORT TERM GOAL #5   Title  Patient will participate in further assessment of gait speed.    Baseline  .12 ft/sec    Time  4    Period  Weeks    Status  Achieved    Target Date  01/20/19      PT SHORT TERM GOAL #6   Title  Patient will perform all bed mobility with supervision in order to decrease caregiver burden and increase independence.    Baseline  Pt still requiring min assist    Time  4    Period  Weeks    Status  Not Met    Target Date  01/20/19        PT Long Term Goals - 01/22/19 0905      PT LONG TERM GOAL #1   Title  Patient will be independent with final HEP in order to continue building upon functional gains made in therapy.  (DUE 02/19/19)    Time  8    Period  Weeks    Status  New      PT LONG TERM GOAL #2   Title  Patient will ambulate at least 150 feet with LRAD and supervision in order to improve functional mobility in the household and community.    Baseline  115' with min A from therapist with rollator    Time  8    Period  Weeks    Status  Revised      PT LONG TERM GOAL #3   Title  Patient will increase BERG score to at least 26/56 in order to decrease risk of future falls.    Baseline  21/56    Time  8    Period  Weeks    Status  Revised      PT LONG TERM GOAL #4   Title  Patient will perform functional transfers and sit <> stands with mod I in order to decrease caregiver burden.    Baseline  supervision-min A sit <> stand and stand pivot    Time  8    Period  Weeks    Status  Revised      PT LONG TERM GOAL #5   Title  Patient will increase gait speed to at least 1.0 ft/sec with LRAD in order to decrease risk of falls and improve household ambulation.    Baseline  .43 ft/sec with RW and rollator     Time  8    Period  Weeks    Status  Revised      PT LONG TERM GOAL #6   Title  Patient will ascend/descend 3 steps using LRAD with supervision in order to enter/exit home without use of a ramp.    Time  8    Period  Weeks    Status  New            Plan - 02/11/19 1018    Clinical Impression Statement  Treatment session continued to address impairments with forward weight shifting, LE ROM and LE strength with standing stretches, weight shifting and step downs on rockerboard.  Pt continues to demonstrate significant LE extensor weakness.  Will continue to address in therapy and with HEP and pt encouraged to continue to gradually increase frequency and distance of ambulation at home.    PT  Treatment/Interventions  ADLs/Self Care Home Management;Gait training;DME Instruction;Stair training;Functional mobility training;Therapeutic activities;Patient/family education;Neuromuscular re-education;Balance training;Therapeutic exercise;Passive range of motion    PT Next Visit Plan  Review straight leg bridges in supine with feet over wedge.  CHECK LONG TERM GOALS NEXT WEEK (discussed reduction to 2x/week with patient); Work on extending gait for endurance and mobility, L weight shifting in standing and forward weight shifting-rockerboard.  Step ups and step downs to address stairs.    PT Home Exercise Plan  [email or web address]    Consulted and Agree with Plan of Care  Patient;Family member/caregiver    Family Member Consulted  Daughter, [name]       Patient will benefit from skilled therapeutic intervention in order to improve the following deficits and impairments:  Abnormal gait, Decreased activity tolerance, Decreased balance, Decreased range of motion, Decreased mobility, Decreased endurance, Decreased skin integrity, Decreased strength, Difficulty walking, Impaired perceived functional ability, Postural dysfunction  Visit Diagnosis: 1. Hemiplegia and hemiparesis following cerebral infarction affecting  left non-dominant side ([place])   2. Muscle weakness (generalized)   3. Unsteadiness on feet   4. Other abnormalities of gait and mobility        Problem List Patient Active Problem List   Diagnosis Date Noted  . Iron deficiency anemia 02/03/2019  . Pelvic fracture ([place]) 09/21/2018  . Allergic reaction caused by a drug 05/06/2018  . Bilateral leg edema 04/13/2018  . HLD (hyperlipidemia) 03/23/2018  . Mood swings 03/23/2018  . Chronic kidney disease (CKD), stage III (moderate) ([facility])   . Embolic stroke ([place]) 86/14/8307  . Left hemiparesis ([place])   . Diabetes mellitus type 2 in nonobese ([facility])   . Acute ischemic stroke ([place])   . Left arm weakness   . Acute CVA (cerebrovascular accident) ([place]) 02/24/2018  . Macular edema due to secondary diabetes ([place]) 02/24/2018  . Overweight (BMI 25.0-29.9) 02/24/2018  . CKD (chronic kidney disease), stage III ([place]) 02/24/2018  . Diabetes mellitus without complication ([place]) 35/43/0148  . HTN (hypertension) 09/02/2017  . Knee pain, bilateral 09/02/2017    Rico Junker, PT, DPT 02/11/19    10:23 AM    [facility] [address] [place], [place], [address] Phone: [phone]   Fax:  [phone]  Name: [name] MRN: [id number] Date of Birth: 01-02-1946

## 2019-02-11 NOTE — Patient Instructions (Signed)
Access Code: [email or web address]  URL: [email or web address]  Date: 02/11/2019  Prepared by: Misty Stanley   Exercises Sit to Stand with Counter Support - 10 reps - 1 sets - 1x daily - 5x weekly Standing March with Counter Support - 10 reps - 1 sets - 1x daily - 5x weekly Mini Squat with Counter Support - 10 reps - 1 sets - 1x daily - 5x weekly Supine Bridge with Resistance Band - 10 reps - 1 sets - 1x daily - 5x weekly 90/90 Lower Trunk Rotation - 10 reps - 2 sets - 1x daily - 7x weekly Standing Gastroc Stretch on Step with Counter Support - 4 sets - 30 seconds hold - 1x daily - 7x weekly Supine Single Knee to Chest - 3 reps - 1 sets - 30 seconds hold - 1x daily - 7x weekly Seated Hamstring Stretch - 3 reps - 1 sets - 30 sec hold - 2x daily - 7x weekly Modified Thomas Stretch - 2 sets - 60 second hold - 1x daily - 7x weekly Seated Piriformis Stretch - 3 reps - 1 sets - 30 sec hold - 2x daily - 7x weekly Straight leg bridge with feet over wedge - 10 reps - 2 sets - 2-3 seconds hold - 2x daily - 7x weekly

## 2019-02-15 ENCOUNTER — Ambulatory Visit: Payer: Medicare Other | Admitting: Physical Medicine & Rehabilitation

## 2019-02-17 ENCOUNTER — Ambulatory Visit: Payer: Medicare Other | Admitting: Occupational Therapy

## 2019-02-17 ENCOUNTER — Encounter: Payer: Self-pay | Admitting: Physical Therapy

## 2019-02-17 ENCOUNTER — Other Ambulatory Visit: Payer: Self-pay

## 2019-02-17 ENCOUNTER — Encounter: Payer: Self-pay | Admitting: Occupational Therapy

## 2019-02-17 ENCOUNTER — Ambulatory Visit: Payer: Medicare Other | Admitting: Physical Therapy

## 2019-02-17 DIAGNOSIS — R2681 Unsteadiness on feet: Secondary | ICD-10-CM

## 2019-02-17 DIAGNOSIS — M6281 Muscle weakness (generalized): Secondary | ICD-10-CM

## 2019-02-17 DIAGNOSIS — G8929 Other chronic pain: Secondary | ICD-10-CM

## 2019-02-17 DIAGNOSIS — R2689 Other abnormalities of gait and mobility: Secondary | ICD-10-CM

## 2019-02-17 DIAGNOSIS — I69354 Hemiplegia and hemiparesis following cerebral infarction affecting left non-dominant side: Secondary | ICD-10-CM | POA: Diagnosis not present

## 2019-02-17 DIAGNOSIS — M25512 Pain in left shoulder: Secondary | ICD-10-CM

## 2019-02-17 DIAGNOSIS — R278 Other lack of coordination: Secondary | ICD-10-CM

## 2019-02-17 DIAGNOSIS — M25612 Stiffness of left shoulder, not elsewhere classified: Secondary | ICD-10-CM

## 2019-02-17 NOTE — Therapy (Signed)
[facility] [address] [place], [place], [address] Phone: [phone]   Fax:  [phone]  Occupational Therapy Treatment  Patient Details  Name: [name] MRN: [id number] Date of Birth: May 22, 1946 Referring Provider (OT): Dr. Letta Pate   Encounter Date: 02/17/2019  OT End of Session - 02/17/19 0724    Visit Number  12    Number of Visits  17    Date for OT Re-Evaluation  02/16/19    Authorization Type  U[employer] MCR    Authorization Time Period  WEEK 7/8    Authorization - Visit Number  12    Authorization - Number of Visits  20    OT Start Time  0804    OT Stop Time  0846    OT Time Calculation (min)  42 min    Activity Tolerance  Patient tolerated treatment well    Behavior During Therapy  [facility] for tasks assessed/performed       Past Medical History:  Diagnosis Date  . Allergy   . Arthritis   . Cataract   . Diabetes mellitus without complication ([place])   . Hypertension   . Macular degeneration syndrome    with edema---being treated.     Past Surgical History:  Procedure Laterality Date  . callous removal  Left 2017   located on 1st digit on L foot 2/2 diabetes  . EYE SURGERY      There were no vitals filed for this visit.  Subjective Assessment - 02/17/19 0724    Subjective   didn't do much while dtr was at the beach    Pertinent History  CVA 02/27/18, pelvic fx and Lt hip  fx 09/2018. PMH: HTN, OA    Limitations  Fall risk, wounds on feet    Currently in Pain?  No/denies       Checked LTGs and discussed progress--see below in prep for renewal.  [facility] OT Assessment - 02/17/19 0813      Coordination   9 Hole Peg Test  Left    Left 9 Hole Peg Test  5 pegs placed within 47mn.   completed in 4 min 43sec     Hand Function   Left Hand Grip (lbs)  5       In sitting in chair at table, performed table slides for shoulder flex stretch and abduction/lateral wt shift with min cueing.  Sweeping  in sitting for incr participation in IADLs and for closed-chain LUE movement and lateral wt. Shift/trunk rotation.  Then attempting to use dustpan for bilateral low range functional reaching/UE use and incr trunk control/ hip flexibility with mod difficulty getting dustpan flat on floor.  Min cueing for placement of broom/dustpan.  Placing yellow clothesspins on/off edge of box with min-mod difficulty for incr pinch strength.              OT Education - 02/17/19 0901    Education Details  Educated pt to perform more functional activities at home in general and ways to incorporate L hand and trunk activation (sitting up tall during commercials, squeezing out sponge/cloth, sweeping in w/c, simple snack prep or stirring from w/c level, etc)    Person(s) Educated  Patient;Child(ren)    Methods  Explanation;Demonstration;Verbal cues    Comprehension  Verbalized understanding;Returned demonstration;Verbal cues required       OT Short Term Goals - 02/17/19 0825      OT SHORT TERM GOAL #1   Title  Independent with initial  HEP for shoulder ROM, and functional reaching LUE - due 01/22/19    Time  4    Period  Weeks    Status  Achieved      OT SHORT TERM GOAL #2   Title  Pt to perform UB dressing with set up only and LE dressing with mod assist or less using A/E prn    Time  4    Period  Weeks    Status  Achieved      OT SHORT TERM GOAL #3   Title  Pt to increase LUE functional use as evidenced by performing 20 blocks on Box & Blocks test    Baseline  11 blocks    Time  4    Period  Weeks    Status  On-going   01/27/19:  11 blocks     OT SHORT TERM GOAL #4   Title  Pt to perform toileting/clothes management with only min assist    Period  Weeks    Status  Achieved   Able to complete - simulation -  in clinic.  01/27/19:  met per pt/dtr report     OT SHORT TERM GOAL #5   Title  Pt to achieve 90* shoulder flexion LUE for mid level reaching    Time  4    Period  Weeks    Status   On-going   01/18/19:  75* with min compensation.  02/17/19:  85* with min compensation       OT Long Term Goals - 02/17/19 0806      OT LONG TERM GOAL #1   Title  Independent with updated HEP - due 02/16/19    Time  8    Period  Weeks    Status  On-going      OT LONG TERM GOAL #2   Title  Pt to be mod I with all BADLS using A/E as needed (except shower transfers to still be supervision)    Time  8    Period  Weeks    Status  On-going   02/17/19:  assist for compression stockings, min A for depends, supervision for shower transfer (mod I for bathing), toileting with BSC mod I     OT LONG TERM GOAL #3   Title  Pt to improve LUE function as evidenced by performing 25 blocks or greater on Box & Blocks test    Time  8    Period  Weeks    Status  On-going   02/17/19:  13     OT LONG TERM GOAL #4   Title  Pt to improve coordination as evidenced by performing 9 hole peg test completely in 90 sec or less    Baseline  placed 5 in 2 min.    Time  8    Period  Weeks    Status  On-going   02/17/19:  5 pegs within 68mn.  completed in 454m 43sec     OT LONG TERM GOAL #5   Title  Grip strength Lt hand to be 15 lbs or greater    Baseline  8 lbs    Time  8    Period  Weeks    Status  On-going   02/17/19:  5lbs     OT LONG TERM GOAL #6   Title  Pt to perform simple snack, cold food, and microwaveable food mod I level with DME prn without LOB    Time  8  Period  Weeks    Status  On-going   02/17/19:  not performing     OT LONG TERM GOAL #7   Title  Pt to achieve 110* or greater P/ROM Lt shoulder with pain less than or equal to 5/10    Time  8    Period  Weeks    Status  Achieved   02/17/19:  110-115* in sitting with 4-5/10 pain           Plan - 02/17/19 0725    Clinical Impression Statement  Pt is progressing slowly toward goals.  Emphasized importance of increasing participation in ADLs/IADLs.  Pt verbalized understanding.    Occupational performance deficits (Please refer to  evaluation for details):  ADL's;IADL's;Leisure    Body Structure / Function / Physical Skills  ADL;ROM;Balance;IADL;Body mechanics;Improper spinal/pelvic alignment;Mobility;Strength;Flexibility;FMC;Coordination;UE functional use;Proprioception;Decreased knowledge of use of DME    Rehab Potential  Good    OT Frequency  2x / week    OT Duration  8 weeks    OT Treatment/Interventions  Self-care/ADL training;Therapeutic exercise;Functional Mobility Training;Neuromuscular education;Aquatic Therapy;Manual Therapy;Splinting;Therapeutic activities;Paraffin;DME and/or AE instruction;Cognitive remediation/compensation;Electrical Stimulation;Visual/perceptual remediation/compensation;Moist Heat;Passive range of motion;Patient/family education    Plan  continue trunk and LUE neuro re-educ, ADLs/simple snack prep, anticipate renewal    Consulted and Agree with Plan of Care  Patient;Family member/caregiver    Family Member Consulted  Daughter Unk Lightning)       Patient will benefit from skilled therapeutic intervention in order to improve the following deficits and impairments:   Body Structure / Function / Physical Skills: ADL, ROM, Balance, IADL, Body mechanics, Improper spinal/pelvic alignment, Mobility, Strength, Flexibility, FMC, Coordination, UE functional use, Proprioception, Decreased knowledge of use of DME       Visit Diagnosis: 1. Hemiplegia and hemiparesis following cerebral infarction affecting left non-dominant side ([place])   2. Muscle weakness (generalized)   3. Unsteadiness on feet   4. Other abnormalities of gait and mobility   5. Stiffness of left shoulder, not elsewhere classified   6. Other lack of coordination   7. Chronic left shoulder pain       Problem List Patient Active Problem List   Diagnosis Date Noted  . Iron deficiency anemia 02/03/2019  . Pelvic fracture ([place]) 09/21/2018  . Allergic reaction caused by a drug 05/06/2018  . Bilateral leg edema 04/13/2018  . HLD  (hyperlipidemia) 03/23/2018  . Mood swings 03/23/2018  . Chronic kidney disease (CKD), stage III (moderate) (HCC)   . Embolic stroke ([place]) 95/28/4132  . Left hemiparesis ([place])   . Diabetes mellitus type 2 in nonobese (HCC)   . Acute ischemic stroke ([place])   . Left arm weakness   . Acute CVA (cerebrovascular accident) ([place]) 02/24/2018  . 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 Overweight (BMI 25.0-29.9) 02/24/2018  . CKD (chronic kidney disease), stage III ([place]) 02/24/2018  . Diabetes mellitus without complication ([place]) 44/07/270  . HTN (hypertension) 09/02/2017  . Knee pain, bilateral 09/02/2017    [facility] 02/17/2019, 9:12 AM  [facility] [address] [place], [place], [address] Phone: [phone]   Fax:  [phone]  Name: [name] MRN: [id number] Date of Birth: 11-12-1945   Vianne Bulls, OTR/L [facility] [address] [place],   [phone] phone [phone] 02/17/19 9:12 AM

## 2019-02-17 NOTE — Therapy (Signed)
[facility] [address] [place], [place], [address] Phone: [phone]   Fax:  [phone]  Physical Therapy Treatment  Patient Details  Name: [name] MRN: [id number] Date of Birth: August 01, 1945 Referring Provider (PT): Alysia Penna, MD   Encounter Date: 02/17/2019  PT End of Session - 02/17/19 1423    Visit Number  21    Number of Visits  21    Date for PT Re-Evaluation  02/19/19    Authorization Type  UHC Medicare, 10th visit progress note    PT Start Time  0717    PT Stop Time  0800    PT Time Calculation (min)  43 min    Activity Tolerance  Patient tolerated treatment well    Behavior During Therapy  [facility] for tasks assessed/performed       Past Medical History:  Diagnosis Date  . Allergy   . Arthritis   . Cataract   . Diabetes mellitus without complication ([place])   . Hypertension   . Macular degeneration syndrome    with edema---being treated.     Past Surgical History:  Procedure Laterality Date  . callous removal  Left 2017   located on 1st digit on L foot 2/2 diabetes  . EYE SURGERY      There were no vitals filed for this visit.  Subjective Assessment - 02/17/19 0721    Subjective  Pt's daughter was at the beach for the past few days and pt was not able to walk or perform her standing exercises. Did do stretches.  Did walk down the stairs and out to the car this morning.    Pertinent History  Patient had a R CVA 02/2018 and was walking with a SPC prior to a fall in March 2020 resulting in being hospitalized with multiple pelvic fxs and a L hip fx. During hospital stay, patient acquired pressure ulcers on bilateral soles of feet near heels.Patient is now safely able to bear weight and pressure ulcers are now a stage 1 (per daughter report and photos).    Patient Stated Goals  "Be back to normal, get back to walking, and getting strength back"    Currently in Pain?  No/denies          [facility] PT Assessment - 02/17/19 0729      Assessment   Medical Diagnosis  Left hemiparesis from Rt CVA 02/27/18    Referring Provider (PT)  Alysia Penna, MD    Onset Date/Surgical Date  02/27/18    Hand Dominance  Right      Precautions   Precautions  Fall    Precaution Comments  wounds healing on both feet      Prior Function   Level of Independence  Independent with household mobility with device      Observation/Other Assessments   Focus on Therapeutic Outcomes (FOTO)   Not assessed      Transfers   Transfers  Sit to Stand;Stand to Sit    Sit to Stand  5: Supervision    Stand to Sit  5: Supervision    Stand Pivot Transfers  5: Supervision    Stand Pivot Transfer Details (indicate cue type and reason)  with rollator      Ambulation/Gait   Ambulation/Gait  Yes    Ambulation/Gait Assistance  5: Supervision;4: Min guard    Ambulation/Gait Assistance Details  increased L foot drag with turns    Ambulation Distance (Feet)  200 Feet  Assistive device  Rollator    Gait Pattern  Step-through pattern;Decreased step length - left;Decreased dorsiflexion - left;Decreased step length - right;Left foot flat;Right foot flat    Ambulation Surface  Level;Indoor    Stairs  Yes    Stairs Assistance  4: Min assist    Stair Management Technique  Two rails;Step to pattern;Forwards    Number of Stairs  4    Height of Stairs  6      Standardized Balance Assessment   Standardized Balance Assessment  10 meter walk test    10 Meter Walk  45.69 seconds or .33 ft/sec                   [facility] PT Treatment/Exercise - 02/17/19 0729      Therapeutic Activites    Therapeutic Activities  Other Therapeutic Activities    Other Therapeutic Activities  discussed with pt and daughter focus of therapy on progressing pt towards supervision for standing balance and strengthening exercises and ambulation short distances with husband's supervision at home when daughter not present  to alllow pt to carryover what she is practicing in therapy and to continue to progress in her home environment.  When daughter is away pt does not perform any standing exercises or ambulation due to husband's inability to provide physical assistance             PT Education - 02/17/19 1422    Education Details  progress towards goals and focus of new certification period    [employer]) Educated  Patient;Child(ren)    Methods  Explanation    Comprehension  Verbalized understanding       PT Short Term Goals - 01/22/19 0905      PT SHORT TERM GOAL #1   Title  Patient will be independent with initial HEP focusing on bilateral LE strength and balance to build upon gains made in therapy.    Time  4    Period  Weeks    Status  Achieved    Target Date  01/20/19      PT SHORT TERM GOAL #2   Title  Patient will increase BERG score to at least a 20/56 in order to help decrease risk of future falls.    Baseline  21/56 on 01/20/19    Time  4    Period  Weeks    Status  Achieved    Target Date  01/20/19      PT SHORT TERM GOAL #3   Title  Patient will perform sit <> stand with min A from therapist and no UE support in order to help decrease caregiver burden at home during transfers.    Baseline  Min assist-supervision with no UE assist    Time  4    Period  Weeks    Status  Achieved    Target Date  01/20/19      PT SHORT TERM GOAL #4   Title  Patient will ambulate 100 ft with LRAD and CGA from therapist in order to improve household functional mobility.    Baseline  100 ft with rollator with min assist-01/20/19    Time  4    Period  Weeks    Status  Partially Met    Target Date  01/20/19      PT SHORT TERM GOAL #5   Title  Patient will participate in further assessment of gait speed.    Baseline  .47 ft/sec    Time  4    Period  Weeks    Status  Achieved    Target Date  01/20/19      PT SHORT TERM GOAL #6   Title  Patient will perform all bed mobility with supervision in  order to decrease caregiver burden and increase independence.    Baseline  Pt still requiring min assist    Time  4    Period  Weeks    Status  Not Met    Target Date  01/20/19        PT Long Term Goals - 02/17/19 0726      PT LONG TERM GOAL #1   Title  Patient will be independent with final HEP in order to continue building upon functional gains made in therapy.  (DUE 02/19/19)    Time  8    Period  Weeks    Status  On-going      PT LONG TERM GOAL #2   Title  Patient will ambulate at least 150 feet with LRAD and supervision in order to improve functional mobility in the household and community.    Baseline  115' with min A from therapist with rollator    Time  8    Period  Weeks    Status  Achieved      PT LONG TERM GOAL #3   Title  Patient will increase BERG score to at least 26/56 in order to decrease risk of future falls.    Baseline  21/56    Time  8    Period  Weeks    Status  On-going      PT LONG TERM GOAL #4   Title  Patient will perform functional transfers and sit <> stands with mod I in order to decrease caregiver burden.    Baseline  supervision-min A sit <> stand and stand pivot    Time  8    Period  Weeks    Status  Partially Met      PT LONG TERM GOAL #5   Title  Patient will increase gait speed to at least 1.0 ft/sec with LRAD in order to decrease risk of falls and improve household ambulation.    Baseline  .71 ft/sec    Time  8    Period  Weeks    Status  Partially Met      PT LONG TERM GOAL #6   Title  Patient will ascend/descend 3 steps using LRAD with supervision in order to enter/exit home without use of a ramp.    Baseline  min A with 2 rails    Time  8    Period  Weeks    Status  Partially Met            Plan - 02/17/19 1423    Clinical Impression Statement  Initiated assessment of LTG with plan to complete LTG assessment tomorrow and recertify for 8 more weeks.  Through today pt has met 1/6 LTG and has partially met 3 LTG.  Final 2  LTG to be assessed tomorrow.  Pt is demonstrated improved LE strength, improved posture and postural control, improved endurance and gait mechanics allowing pt to progress to supervision overall for stand pivot transfers and ambulation longer distances over level surfaces with rollator.  As pt fatigues and when pt performs changes in direction she still experiences intermittent L foot catching and decreased step length and therefore did not meet goal for MOD I transfers.  Pt also  demonstrates improved strength and balance to perform stair negotiation with bilat UE support on rails ascending and descending forwards but continues to require min A for safety and weight shifting and did not meet goal of supervision.  Will assess standing balance, falls risk and HEP tomorrow and plan to recertify for 8 more weeks to continue to progress pt towards more independent transfers, standing balance and ambulation with LRAD.  Pt currently requires supervision-min A of daughter to perform ambulation and exercises at home (husband unable to assist due to medical complications) and daughter is not able to be present 24/7.  Pt and daughter agreeable to plan.    PT Frequency  2x / week    PT Duration  8 weeks    PT Treatment/Interventions  ADLs/Self Care Home Management;Gait training;DME Instruction;Stair training;Functional mobility training;Therapeutic activities;Patient/family education;Neuromuscular re-education;Balance training;Therapeutic exercise;Passive range of motion    PT Next Visit Plan  Finish checking LTG; recertify 2x/week x 8.  Review HEP and add more standing balance at sink (quarter turns, side stepping); work towards pt performing HEP and ambulation short distances with husband's supervision at home when daughter not present.  Work on extending gait for endurance and mobility, L weight shifting in standing and forward weight shifting-rockerboard.  Step ups and step downs to address stairs.    PT Home Exercise  Plan  [id number]    Consulted and Agree with Plan of Care  Patient;Family member/caregiver    Family Member Consulted  Daughter, [name]       Patient will benefit from skilled therapeutic intervention in order to improve the following deficits and impairments:  Abnormal gait, Decreased activity tolerance, Decreased balance, Decreased range of motion, Decreased mobility, Decreased endurance, Decreased skin integrity, Decreased strength, Difficulty walking, Impaired perceived functional ability, Postural dysfunction  Visit Diagnosis: 1. Hemiplegia and hemiparesis following cerebral infarction affecting left non-dominant side ([place])   2. Muscle weakness (generalized)   3. Unsteadiness on feet   4. Other abnormalities of gait and mobility        Problem List Patient Active Problem List   Diagnosis Date Noted  . Iron deficiency anemia 02/03/2019  . Pelvic fracture ([place]) 09/21/2018  . Allergic reaction caused by a drug 05/06/2018  . Bilateral leg edema 04/13/2018  . HLD (hyperlipidemia) 03/23/2018  . Mood swings 03/23/2018  . Chronic kidney disease (CKD), stage III (moderate) (HCC)   . Embolic stroke ([place]) 29/09/7953  . Left hemiparesis ([place])   . Diabetes mellitus type 2 in nonobese (HCC)   . Acute ischemic stroke ([place])   . Left arm weakness   . Acute CVA (cerebrovascular accident) ([place]) 02/24/2018  . 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 Overweight (BMI 25.0-29.9) 02/24/2018  . CKD (chronic kidney disease), stage III ([place]) 02/24/2018  . Diabetes mellitus without complication ([place]) 83/16/7425  . HTN (hypertension) 09/02/2017  . Knee pain, bilateral 09/02/2017    Rico Junker, PT, DPT 02/17/19    2:35 PM    [facility] [address] [place], [place], [address] Phone: [phone]   Fax:  [phone]  Name: [name] MRN: [id number] Date of Birth: [birth year]

## 2019-02-18 ENCOUNTER — Ambulatory Visit: Payer: Medicare Other | Admitting: Physical Therapy

## 2019-02-18 ENCOUNTER — Encounter: Payer: Self-pay | Admitting: Physical Therapy

## 2019-02-18 DIAGNOSIS — I69354 Hemiplegia and hemiparesis following cerebral infarction affecting left non-dominant side: Secondary | ICD-10-CM | POA: Diagnosis not present

## 2019-02-18 DIAGNOSIS — R2681 Unsteadiness on feet: Secondary | ICD-10-CM

## 2019-02-18 DIAGNOSIS — R2689 Other abnormalities of gait and mobility: Secondary | ICD-10-CM

## 2019-02-18 DIAGNOSIS — M6281 Muscle weakness (generalized): Secondary | ICD-10-CM

## 2019-02-18 NOTE — Therapy (Signed)
[facility] [address] [place], [place], [address] Phone: [phone]   Fax:  [phone]  Physical Therapy Treatment  Patient Details  Name: [name] MRN: [id number] Date of Birth: 04-Jul-1946 Referring Provider (PT): Alysia Penna, MD   Encounter Date: 02/18/2019  PT End of Session - 02/18/19 0856    Visit Number  22    Number of Visits  40    Date for PT Re-Evaluation  04/19/19    Authorization Type  [employer] Medicare, 10th visit progress note    PT Start Time  0806    PT Stop Time  0850    PT Time Calculation (min)  44 min    Activity Tolerance  Patient tolerated treatment well    Behavior During Therapy  [facility] for tasks assessed/performed       Past Medical History:  Diagnosis Date  . Allergy   . Arthritis   . Cataract   . Diabetes mellitus without complication ([place])   . Hypertension   . Macular degeneration syndrome    with edema---being treated.     Past Surgical History:  Procedure Laterality Date  . callous removal  Left 2017   located on 1st digit on L foot 2/2 diabetes  . EYE SURGERY      There were no vitals filed for this visit.  Subjective Assessment - 02/18/19 0806    Subjective  Pt was exhausted after yesterday's session and very fatigued into the evening.    Pertinent History  Patient had a R CVA 02/2018 and was walking with a SPC prior to a fall in March 2020 resulting in being hospitalized with multiple pelvic fxs and a L hip fx. During hospital stay, patient acquired pressure ulcers on bilateral soles of feet near heels.Patient is now safely able to bear weight and pressure ulcers are now a stage 1 (per daughter report and photos).    Patient Stated Goals  "Be back to normal, get back to walking, and getting strength back"    Currently in Pain?  No/denies         [facility] PT Assessment - 02/18/19 0809      Assessment   Medical Diagnosis  Left hemiparesis from Rt CVA 02/27/18    Referring Provider (PT)  Alysia Penna, MD    Onset Date/Surgical Date  02/27/18    Hand Dominance  Right      Precautions   Precautions  Fall    Precaution Comments  wounds healing on both feet      Prior Function   Level of Independence  Independent with household mobility with device      Observation/Other Assessments   Focus on Therapeutic Outcomes (FOTO)   Not assessed      Berg Balance Test   Sit to Stand  Able to stand  independently using hands    Standing Unsupported  Able to stand 30 seconds unsupported    Sitting with Back Unsupported but Feet Supported on Floor or Stool  Able to sit safely and securely 2 minutes    Stand to Sit  Controls descent by using hands    Transfers  Able to transfer safely, definite need of hands    Standing Unsupported with Eyes Closed  Able to stand 10 seconds with supervision    Standing Unsupported with Feet Together  Needs help to attain position but able to stand for 30 seconds with feet together    From Standing, Reach Forward with  Outstretched Arm  Can reach forward >5 cm safely (2")    From Standing Position, Pick up Object from Floor  Unable to pick up shoe, but reaches 2-5 cm (1-2") from shoe and balances independently    From Standing Position, Turn to Look Behind Over each Shoulder  Needs supervision when turning    Turn 360 Degrees  Needs assistance while turning    Standing Unsupported, Alternately Place Feet on Step/Stool  Able to complete >2 steps/needs minimal assist    Standing Unsupported, One Foot in [place] help to step but can hold 15 seconds    Standing on One Leg  Unable to try or needs assist to prevent fall    Total Score  26    Berg comment:  26/56 high falls risk                        Balance Exercises - 02/18/19 0854      Balance Exercises: Standing   Standing Eyes Opened  Wide ([place]);Solid surface;2 reps;10 secs    SLS  Eyes open;Solid surface;Upper extremity support 2;5 reps   3 second  hold on each LE   Turning  Right;Left;5 reps   with one UE support on counter top   Marching Limitations  At counter top with UE support on sink combined with SLS above        PT Education - 02/18/19 0855    Education Details  progress towards goals, areas to continue to focus on; new exercises to perform standing at sink    Person(s) Educated  Patient;Child(ren)    Methods  Explanation;Demonstration;Handout    Comprehension  Verbalized understanding;Returned demonstration       PT Short Term Goals - 01/22/19 0905      PT SHORT TERM GOAL #1   Title  Patient will be independent with initial HEP focusing on bilateral LE strength and balance to build upon gains made in therapy.    Time  4    Period  Weeks    Status  Achieved    Target Date  01/20/19      PT SHORT TERM GOAL #2   Title  Patient will increase BERG score to at least a 20/56 in order to help decrease risk of future falls.    Baseline  21/56 on 01/20/19    Time  4    Period  Weeks    Status  Achieved    Target Date  01/20/19      PT SHORT TERM GOAL #3   Title  Patient will perform sit <> stand with min A from therapist and no UE support in order to help decrease caregiver burden at home during transfers.    Baseline  Min assist-supervision with no UE assist    Time  4    Period  Weeks    Status  Achieved    Target Date  01/20/19      PT SHORT TERM GOAL #4   Title  Patient will ambulate 100 ft with LRAD and CGA from therapist in order to improve household functional mobility.    Baseline  100 ft with rollator with min assist-01/20/19    Time  4    Period  Weeks    Status  Partially Met    Target Date  01/20/19      PT SHORT TERM GOAL #5   Title  Patient will participate in further assessment of gait speed.  Baseline  .24 ft/sec    Time  4    Period  Weeks    Status  Achieved    Target Date  01/20/19      PT SHORT TERM GOAL #6   Title  Patient will perform all bed mobility with supervision in order to  decrease caregiver burden and increase independence.    Baseline  Pt still requiring min assist    Time  4    Period  Weeks    Status  Not Met    Target Date  01/20/19        PT Long Term Goals - 02/18/19 0827      PT LONG TERM GOAL #1   Title  Patient will be independent with final HEP in order to continue building upon functional gains made in therapy.  (DUE 02/19/19)    Time  8    Period  Weeks    Status  On-going      PT LONG TERM GOAL #2   Title  Patient will ambulate at least 150 feet with LRAD and supervision in order to improve functional mobility in the household and community.    Baseline  115' with min A from therapist with rollator    Time  8    Period  Weeks    Status  Achieved      PT LONG TERM GOAL #3   Title  Patient will increase BERG score to at least 26/56 in order to decrease risk of future falls.    Baseline  26/56    Time  8    Period  Weeks    Status  Achieved      PT LONG TERM GOAL #4   Title  Patient will perform functional transfers and sit <> stands with mod I in order to decrease caregiver burden.    Baseline  supervision-min A sit <> stand and stand pivot    Time  8    Period  Weeks    Status  Partially Met      PT LONG TERM GOAL #5   Title  Patient will increase gait speed to at least 1.0 ft/sec with LRAD in order to decrease risk of falls and improve household ambulation.    Baseline  .71 ft/sec    Time  8    Period  Weeks    Status  Partially Met      PT LONG TERM GOAL #6   Title  Patient will ascend/descend 3 steps using LRAD with supervision in order to enter/exit home without use of a ramp.    Baseline  min A with 2 rails    Time  8    Period  Weeks    Status  Partially Met       New Goals for Recertification: PT Short Term Goals - 02/18/19 0917      PT SHORT TERM GOAL #1   Title  Pt will demonstrate ability to perform ongoing HEP (stretching and standing exercises) with supervision    Time  4    Period  Weeks    Status   Revised    Target Date  03/20/19      PT SHORT TERM GOAL #2   Title  Patient will increase BERG score to at least a 30/56 in order to help decrease risk of future falls.    Baseline  26/30    Time  4    Period  Weeks  Status  Revised    Target Date  03/20/19      PT SHORT TERM GOAL #3   Title  Pt will perform stand pivot transfers with rollator x 5 reps with distant supervision    Time  4    Period  Weeks    Status  Revised    Target Date  03/20/19      PT SHORT TERM GOAL #4   Title  Pt will ambulate x 115' with LRAD on level, indoor surfaces with distant supervision    Baseline  min guard x 200' with rollator    Time  4    Period  Weeks    Status  Revised    Target Date  03/20/19      PT SHORT TERM GOAL #5   Title  Pt will improve gait velocity with LRAD to >/= 1.0 ft/sec    Baseline  .71    Time  4    Period  Weeks    Status  Revised    Target Date  03/20/19      PT SHORT TERM GOAL #6   Title  Patient will negotiate 4 stairs with 2 rails, step to sequence with supervision    Baseline  Min A    Time  4    Period  Weeks    Status  Revised    Target Date  03/20/19      PT Long Term Goals - 02/18/19 0827      PT LONG TERM GOAL #1   Title  Patient will report performing HEP atleast 5 days a week with husband's supervision    Baseline  daughter provides supervision/min A    Time  8    Period  Weeks    Status  Revised    Target Date  04/19/19      PT LONG TERM GOAL #2   Title  Pt will ambulate 230' with LRAD over indoor surfaces with supervision and will report ambulating short distances at home with LRAD and husband's supervision    Baseline  --    Time  8    Period  Weeks    Status  Revised    Target Date  04/19/19      PT LONG TERM GOAL #3   Title  Patient will increase BERG score to at least 34/56 in order to decrease risk of future falls.    Baseline  --    Time  8    Period  Weeks    Status  Revised    Target Date  04/19/19      PT LONG TERM  GOAL #4   Title  Pt will perform sit <> stand and stand pivots with LRAD MOD I    Baseline  --    Time  8    Period  Weeks    Status  Revised    Target Date  04/19/19      PT LONG TERM GOAL #5   Title  Patient will increase gait speed to at least 1.8t/sec with LRAD in order to decrease risk of falls and improve household ambulation.    Baseline  --    Time  8    Period  Weeks    Status  Revised    Target Date  04/19/19      PT LONG TERM GOAL #6   Title  Pt will ambulate x 100' outside over pavement with LRAD and supervision-min A  Baseline  --    Time  8    Period  Weeks    Status  Revised    Target Date  04/19/19           Plan - 02/18/19 0857    Clinical Impression Statement  Continued to assess LTG with re-assessment of standing balance and falls risk. Pt has met BERG goal with a 5 point improvement but continues to be at high falls risk.  Pt continues to have significant difficulty maintaining standing static and dynamic balance without use of UE for support and demonstrates increased flexed trunk without UE support.  Goals revised and focus of ongoing therapy will be on postural control and static/dynamic balance with decreased UE support and progressing pt to supervision overall to allow pt to perform HEP and ambulation short distances in her home with LRAD when daughter is not present.  Updated HEP to begin to address these impairments.  Pt and daughter verbalized agreement.    Rehab Potential  Good    PT Frequency  2x / week    PT Duration  8 weeks    PT Treatment/Interventions  ADLs/Self Care Home Management;Gait training;DME Instruction;Stair training;Functional mobility training;Therapeutic activities;Patient/family education;Neuromuscular re-education;Balance training;Therapeutic exercise;Passive range of motion;Orthotic Fit/Training    PT Next Visit Plan  Standing balance with decreased UE support; work towards pt performing HEP and ambulation short distances with  husband's supervision at home when daughter not present.  Work on extending gait for endurance and mobility, L weight shifting in standing and forward weight shifting-rockerboard.  Step ups and step downs to address stairs.    PT Home Exercise Plan  [email or web address]    Consulted and Agree with Plan of Care  Patient;Family member/caregiver    Family Member Consulted  Daughter, [name]       Patient will benefit from skilled therapeutic intervention in order to improve the following deficits and impairments:  Abnormal gait, Decreased activity tolerance, Decreased balance, Decreased range of motion, Decreased mobility, Decreased endurance, Decreased skin integrity, Decreased strength, Difficulty walking, Impaired perceived functional ability, Postural dysfunction  Visit Diagnosis: 1. Hemiplegia and hemiparesis following cerebral infarction affecting left non-dominant side ([place])   2. Muscle weakness (generalized)   3. Unsteadiness on feet   4. Other abnormalities of gait and mobility        Problem List Patient Active Problem List   Diagnosis Date Noted  . Iron deficiency anemia 02/03/2019  . Pelvic fracture ([place]) 09/21/2018  . Allergic reaction caused by a drug 05/06/2018  . Bilateral leg edema 04/13/2018  . HLD (hyperlipidemia) 03/23/2018  . Mood swings 03/23/2018  . Chronic kidney disease (CKD), stage III (moderate) (HCC)   . Embolic stroke ([place]) 16/04/9603  . Left hemiparesis ([place])   . Diabetes mellitus type 2 in nonobese (HCC)   . Acute ischemic stroke ([place])   . Left arm weakness   . Acute CVA (cerebrovascular accident) ([place]) 02/24/2018  . Macular edema due to secondary diabetes ([place]) 02/24/2018  . Overweight (BMI 25.0-29.9) 02/24/2018  . CKD (chronic kidney disease), stage III ([place]) 02/24/2018  . Diabetes mellitus without complication ([place]) 54/03/8118  . HTN (hypertension) 09/02/2017  . Knee pain, bilateral 09/02/2017    Rico Junker, PT, DPT 02/18/19    9:17 AM    [facility] [address] [place], [place], [address] Phone: [phone]   Fax:  [phone]  Name: [name] MRN: [id number] Date of Birth: [birth year]

## 2019-02-18 NOTE — Patient Instructions (Signed)
Access Code: [email or web address]  URL: [email or web address]  Date: 02/18/2019  Prepared by: Misty Stanley   Exercises Sit to Stand with Counter Support - 10 reps - 1 sets - 1x daily - 5x weekly Standing March with Counter Support - 5 reps - 1 sets - 3 second hold - 1x daily - 5x weekly Mini Squat with Counter Support - 10 reps - 1 sets - 1x daily - 5x weekly Supine Bridge with Resistance Band - 10 reps - 1 sets - 1x daily - 5x weekly 90/90 Lower Trunk Rotation - 10 reps - 2 sets - 1x daily - 7x weekly Standing Gastroc Stretch on Step with Counter Support - 4 sets - 30 seconds hold - 1x daily - 7x weekly Supine Single Knee to Chest - 3 reps - 1 sets - 30 seconds hold - 1x daily - 7x weekly Seated Hamstring Stretch - 3 reps - 1 sets - 30 sec hold - 2x daily - 7x weekly Modified Thomas Stretch - 2 sets - 60 second hold - 1x daily - 7x weekly Seated Piriformis Stretch - 3 reps - 1 sets - 30 sec hold - 2x daily - 7x weekly Straight leg bridge with feet over wedge - 10 reps - 2 sets - 2-3 seconds hold - 2x daily - 7x weekly Standing Quarter Turn with Counter Support - 5 reps - 1 sets - 1x daily - 7x weekly Wide Stance with Eyes Closed - 2 sets - 10 second hold - 1x daily - 7x weekly

## 2019-02-19 ENCOUNTER — Encounter: Payer: Self-pay | Admitting: Occupational Therapy

## 2019-02-19 ENCOUNTER — Ambulatory Visit: Payer: Medicare Other | Admitting: Occupational Therapy

## 2019-02-19 ENCOUNTER — Other Ambulatory Visit: Payer: Self-pay

## 2019-02-19 DIAGNOSIS — R278 Other lack of coordination: Secondary | ICD-10-CM

## 2019-02-19 DIAGNOSIS — I69354 Hemiplegia and hemiparesis following cerebral infarction affecting left non-dominant side: Secondary | ICD-10-CM

## 2019-02-19 DIAGNOSIS — M25612 Stiffness of left shoulder, not elsewhere classified: Secondary | ICD-10-CM

## 2019-02-19 DIAGNOSIS — R2689 Other abnormalities of gait and mobility: Secondary | ICD-10-CM

## 2019-02-19 DIAGNOSIS — R2681 Unsteadiness on feet: Secondary | ICD-10-CM

## 2019-02-19 DIAGNOSIS — M25512 Pain in left shoulder: Secondary | ICD-10-CM

## 2019-02-19 DIAGNOSIS — G8929 Other chronic pain: Secondary | ICD-10-CM

## 2019-02-19 DIAGNOSIS — M6281 Muscle weakness (generalized): Secondary | ICD-10-CM

## 2019-02-19 NOTE — Therapy (Signed)
[facility] [address] [place], [place], [address] Phone: [phone]   Fax:  [phone]  Occupational Therapy Treatment  Patient Details  Name: [name] MRN: [id number] Date of Birth: [birth year] Referring Provider (OT): Dr. Letta Pate   Encounter Date: 02/19/2019  OT End of Session - 02/19/19 0723    Visit Number  13    Number of Visits  29   13=16=29   Date for OT Re-Evaluation  04/20/19    Authorization Type  [employer] MCR    Authorization Time Period  cert. period 02/19/19-05/20/19    Authorization - Visit Number  13    Authorization - Number of Visits  20    OT Start Time  [phone]    OT Stop Time  0800    OT Time Calculation (min)  43 min    Activity Tolerance  Patient tolerated treatment well    Behavior During Therapy  [facility] for tasks assessed/performed       Past Medical History:  Diagnosis Date  . Allergy   . Arthritis   . Cataract   . Diabetes mellitus without complication ([place])   . Hypertension   . Macular degeneration syndrome    with edema---being treated.     Past Surgical History:  Procedure Laterality Date  . callous removal  Left 2017   located on 1st digit on L foot 2/2 diabetes  . EYE SURGERY      There were no vitals filed for this visit.  Subjective Assessment - 02/19/19 0720    Subjective   been moving around a little more    Pertinent History  CVA 02/27/18, pelvic fx and Lt hip  fx 09/2018. PMH: HTN, OA    Limitations  Fall risk, wounds on feet    Currently in Pain?  No/denies        In standing, placing large pegs in pegboard for incr in-hand manipulation, standing tolerance, posture, and low-range functional reach.  Pt needed supervision and min cueing for midline alignment, posture, and to use LUE.  Pt did require RUE support on table.  In sitting, flipping cards for incr coordination with min difficulty/cueing for posture, placing yellow clothespins on box for low range  reach, incr pinch strength, and coordination with min cueing for posture and min difficulty and low range reach with abduction to assist to encourage L wt. Shift with min cues.  Then picking up coins and placing in targets for incr coordination, functional reach, and incr LUE functional use with min difficulty/cues for posture.  AAROM shoulder flex and chest press with ball with BUEs with min cueing/faciliation for posture and positioning.  In standing, functional reaching with LUE laterally for mid-range reach and L lateral wt. Shift for incr trunk activation.         OT Short Term Goals - 02/19/19 1005      OT SHORT TERM GOAL #1   Title  Independent with initial HEP for shoulder ROM, and functional reaching LUE    Time  4    Period  Weeks    Status  Achieved      OT SHORT TERM GOAL #2   Title  Pt to perform UB dressing with set up only and LE dressing with mod assist or less using A/E prn    Time  4    Period  Weeks    Status  Achieved      OT SHORT TERM GOAL #3   Title  Pt to increase LUE functional use as evidenced by performing 20 blocks on Box & Blocks test.--STGs due 03/21/19    Baseline  11 blocks    Time  4    Period  Weeks    Status  On-going   01/27/19:  11 blocks     OT SHORT TERM GOAL #4   Title  Pt to perform toileting/clothes management with only min assist    Period  Weeks    Status  Achieved   Able to complete - simulation -  in clinic.  01/27/19:  met per pt/dtr report     OT SHORT TERM GOAL #5   Title  Pt to achieve 90* shoulder flexion LUE for mid level reaching    Time  4    Period  Weeks    Status  On-going   01/18/19:  75* with min compensation.  02/17/19:  85* with min compensation       OT Long Term Goals - 02/19/19 1005      OT LONG TERM GOAL #1   Title  Independent with updated HEP - due 04/20/19    Time  8    Period  Weeks    Status  On-going      OT LONG TERM GOAL #2   Title  Pt to be mod I with all BADLS using A/E as needed (except  shower transfers to still be supervision)    Time  8    Period  Weeks    Status  On-going   02/17/19:  assist for compression stockings, min A for depends, supervision for shower transfer (mod I for bathing), toileting with BSC mod I     OT LONG TERM GOAL #3   Title  Pt to improve LUE function as evidenced by performing 25 blocks or greater on Box & Blocks test    Time  8    Period  Weeks    Status  On-going   02/17/19:  13     OT LONG TERM GOAL #4   Title  Pt to improve coordination as evidenced by performing 9 hole peg test completely in 90 sec or less    Baseline  placed 5 in 2 min.    Time  8    Period  Weeks    Status  On-going   02/17/19:  5 pegs within 32mn.  completed in 436m 43sec     OT LONG TERM GOAL #5   Title  Grip strength Lt hand to be 15 lbs or greater    Baseline  8 lbs    Time  8    Period  Weeks    Status  On-going   02/17/19:  5lbs     OT LONG TERM GOAL #6   Title  Pt to perform simple snack, cold food, and microwaveable food mod I level with DME prn without LOB    Time  8    Period  Weeks    Status  On-going   02/17/19:  not performing     OT LONG TERM GOAL #7   Title  Pt to achieve 110* or greater P/ROM Lt shoulder with pain less than or equal to 5/10    Time  8    Period  Weeks    Status  Achieved   02/17/19:  110-115* in sitting with 4-5/10 pain           Plan - 02/19/19 0724    Clinical Impression Statement  Pt is progressing slowly towards goals with improving posture, activity tolerance, and in-hand manipulation.  Pt has met 3/5 STGs and 1 LTG.  However, progress has been slower than anticipated due to decr frequency and decr posture and activity tolerance.  Pt would benfit from further occupational therapy to incr ADL/IADL participation/independence and improved LUE functional use.    Occupational performance deficits (Please refer to evaluation for details):  ADL's;IADL's;Leisure    Body Structure / Function / Physical Skills   ADL;ROM;Balance;IADL;Body mechanics;Improper spinal/pelvic alignment;Mobility;Strength;Flexibility;FMC;Coordination;UE functional use;Proprioception;Decreased knowledge of use of DME    Rehab Potential  Good    OT Frequency  2x / week    OT Duration  8 weeks    OT Treatment/Interventions  Self-care/ADL training;Therapeutic exercise;Functional Mobility Training;Neuromuscular education;Aquatic Therapy;Manual Therapy;Splinting;Therapeutic activities;Paraffin;DME and/or AE instruction;Cognitive remediation/compensation;Electrical Stimulation;Visual/perceptual remediation/compensation;Moist Heat;Passive range of motion;Patient/family education    Plan  ADLs/simple snack prep, anticipate renewal  (renewal completed 02/19/19--continue with unmet STGs and LTGs)    Consulted and Agree with Plan of Care  Patient;Family member/caregiver    Family Member Consulted  Daughter [place])       Patient will benefit from skilled therapeutic intervention in order to improve the following deficits and impairments:   Body Structure / Function / Physical Skills: ADL, ROM, Balance, IADL, Body mechanics, Improper spinal/pelvic alignment, Mobility, Strength, Flexibility, FMC, Coordination, UE functional use, Proprioception, Decreased knowledge of use of DME       Visit Diagnosis: 1. Hemiplegia and hemiparesis following cerebral infarction affecting left non-dominant side ([place])   2. Muscle weakness (generalized)   3. Unsteadiness on feet   4. Other abnormalities of gait and mobility   5. Stiffness of left shoulder, not elsewhere classified   6. Other lack of coordination   7. Chronic left shoulder pain       Problem List Patient Active Problem List   Diagnosis Date Noted  . Iron deficiency anemia 02/03/2019  . Pelvic fracture ([place]) 09/21/2018  . Allergic reaction caused by a drug 05/06/2018  . Bilateral leg edema 04/13/2018  . HLD (hyperlipidemia) 03/23/2018  . Mood swings 03/23/2018  . Chronic kidney  disease (CKD), stage III (moderate) (HCC)   . Embolic stroke ([place]) 37/34/2876  . Left hemiparesis ([place])   . Diabetes mellitus type 2 in nonobese (HCC)   . Acute ischemic stroke ([place])   . Left arm weakness   . Acute CVA (cerebrovascular accident) ([place]) 02/24/2018  . 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 Overweight (BMI 25.0-29.9) 02/24/2018  . CKD (chronic kidney disease), stage III ([place]) 02/24/2018  . Diabetes mellitus without complication ([place]) 81/15/7262  . HTN (hypertension) 09/02/2017  . Knee pain, bilateral 09/02/2017    [facility] 02/19/2019, 10:07 AM  [facility] [address] [place], [place], [address] Phone: [phone]   Fax:  [phone]  Name: KIMBRA MARCELINO MRN: [id number] Date of Birth: August 27, 1945   Vianne Bulls, OTR/L [facility] [address] [place], [place]  [address] [phone] phone [phone] 02/19/19 10:07 AM

## 2019-02-22 ENCOUNTER — Encounter: Payer: Self-pay | Admitting: Podiatry

## 2019-02-22 ENCOUNTER — Other Ambulatory Visit: Payer: Self-pay

## 2019-02-22 ENCOUNTER — Ambulatory Visit (INDEPENDENT_AMBULATORY_CARE_PROVIDER_SITE_OTHER): Payer: Medicare Other | Admitting: Podiatry

## 2019-02-22 VITALS — Temp 97.5°F | Resp 16

## 2019-02-22 DIAGNOSIS — L97419 Non-pressure chronic ulcer of right heel and midfoot with unspecified severity: Secondary | ICD-10-CM | POA: Diagnosis not present

## 2019-02-22 DIAGNOSIS — L97421 Non-pressure chronic ulcer of left heel and midfoot limited to breakdown of skin: Secondary | ICD-10-CM

## 2019-02-22 NOTE — Progress Notes (Signed)
Subjective:  Patient ID: [name], female    DOB: [birth year],  MRN: [id number]  Chief Complaint  Patient presents with  . Wound Check    F/U BL bottom heels ulcers Pt. states," Rt completely healed and the Lt looks a little better." Tx: santyl and dressing change - w/ pain and tinling w/ shoes on -w/ less rednes and draiange   . Diabetes    FBS: 117 A1C: 6.4    73 y.o. female presents for wound care.  Hx as above.  Review of Systems: Negative except as noted in the HPI. Denies N/V/F/Ch.  Past Medical History:  Diagnosis Date  . Allergy   . Arthritis   . Cataract   . Diabetes mellitus without complication ([place])   . Hypertension   . Macular degeneration syndrome    with edema---being treated.     Current Outpatient Medications:  .  aspirin EC 81 MG tablet, Take 81 mg by mouth daily., Disp: , Rfl:  .  BLACK CURRANT SEED OIL PO, Take 1 capsule by mouth daily., Disp: , Rfl:  .  blood glucose meter kit and supplies KIT, Dispense based on patient and insurance preference. UAD bid for glucose monitoring Dx: E11.9, Disp: 1 each, Rfl: 0 .  collagenase (SANTYL) ointment, Apply 1 application topically daily. Measurement: 2 x 3 x 0.1cm, and 5 x 4 x 0.1cm B/L heels, Disp: 15 g, Rfl: 5 .  fexofenadine (ALLEGRA) 180 MG tablet, Take 180 mg by mouth daily., Disp: , Rfl:  .  glucose blood test strip, UAD bid for glucose monitoring Dx: E11.9, Disp: 100 each, Rfl: 12 .  Lancet Device MISC, UAD bid for glucose monitoring Dx: E11.9, Disp: 1 each, Rfl: prn .  losartan (COZAAR) 25 MG tablet, Take 1 tablet (25 mg total) by mouth 2 (two) times daily., Disp: 60 tablet, Rfl: 3 .  methocarbamol (ROBAXIN) 500 MG tablet, Take 1 tablet (500 mg total) by mouth every 8 (eight) hours as needed for muscle spasms., Disp: 20 tablet, Rfl: 0 .  oxycodone (OXY-IR) 5 MG capsule, Take 1 capsule (5 mg total) by mouth every 4 (four) hours as needed., Disp: 30 capsule, Rfl: 0  Social History   Tobacco Use   Smoking Status Never Smoker  Smokeless Tobacco Never Used    Allergies  Allergen Reactions  . Clonidine Anaphylaxis, Swelling and Other (See Comments)    Tongue swelling  . Norvasc [Amlodipine Besylate] Swelling    Edema and TONGUE swelling   . Lisinopril Cough  . Amlodipine Anxiety, Nausea Only and Swelling  . Codeine Itching and Rash   Objective:   Vitals:   02/22/19 1051  Resp: 16  Temp: (!) 97.5 F (36.4 C)   There is no height or weight on file to calculate BMI. Constitutional Well developed. Well nourished.  Vascular Dorsalis pedis pulses palpable bilaterally. Posterior tibial pulses palpable bilaterally. Capillary refill normal to all digits.  No cyanosis or clubbing noted. Pedal hair growth normal.  Neurologic Normal speech. Oriented to person, place, and time. Protective sensation absent  Dermatologic Right heel no open ulcer Left heel wound 1x0.5 with fibrogranular base no warmth erythema signs of acute infection.  Orthopedic: No pain to palpation either foot.   Radiographs: None today Assessment:   1. Heel ulceration, right, with unspecified severity ([place])   2. Skin ulcer of left heel, limited to breakdown of skin ([place])    Plan:  Patient was evaluated and treated and all questions answered.  Ulcer  left heel -Debrided as below -Wound care supplies ordered. -Dressed with Prisma DSD.  Procedure: Selective Debridement of Wound Rationale: Removal of devitalized tissue from the wound to promote healing.  Pre-Debridement Wound Measurements: 1 cm x 0.5 cm x 0.2 cm  Post-Debridement Wound Measurements: same as pre-debridement. Type of Debridement: sharp selective Tissue Removed: Devitalized soft-tissue Dressing: Dry, sterile, compression dressing. Disposition: Patient tolerated procedure well. Patient to return in 1 week for follow-up.       No follow-ups on file.

## 2019-02-23 ENCOUNTER — Encounter: Payer: Self-pay | Admitting: Physical Therapy

## 2019-02-23 ENCOUNTER — Ambulatory Visit: Payer: Medicare Other | Admitting: Physical Therapy

## 2019-02-23 ENCOUNTER — Ambulatory Visit: Payer: Medicare Other | Admitting: Occupational Therapy

## 2019-02-23 DIAGNOSIS — I69354 Hemiplegia and hemiparesis following cerebral infarction affecting left non-dominant side: Secondary | ICD-10-CM

## 2019-02-23 DIAGNOSIS — M6281 Muscle weakness (generalized): Secondary | ICD-10-CM

## 2019-02-23 DIAGNOSIS — R2681 Unsteadiness on feet: Secondary | ICD-10-CM

## 2019-02-23 DIAGNOSIS — M25612 Stiffness of left shoulder, not elsewhere classified: Secondary | ICD-10-CM

## 2019-02-23 DIAGNOSIS — R2689 Other abnormalities of gait and mobility: Secondary | ICD-10-CM

## 2019-02-23 NOTE — Therapy (Signed)
[facility] [address] [place], [place], [address] Phone: [phone]   Fax:  [phone]  Occupational Therapy Treatment  Patient Details  Name: [name] MRN: [id number] Date of Birth: 1946/05/20 Referring Provider (OT): Dr. Letta Pate   Encounter Date: 02/23/2019  OT End of Session - 02/23/19 0855    Visit Number  14    Number of Visits  29    Date for OT Re-Evaluation  04/20/19    Authorization Type  UHC MCR    Authorization Time Period  cert. period 02/19/19-05/20/19    Authorization - Visit Number  23    Authorization - Number of Visits  20    OT Start Time  0800    OT Stop Time  0845    OT Time Calculation (min)  45 min    Activity Tolerance  Patient tolerated treatment well    Behavior During Therapy  WFL for tasks assessed/performed       Past Medical History:  Diagnosis Date  . Allergy   . Arthritis   . Cataract   . Diabetes mellitus without complication ([place])   . Hypertension   . Macular degeneration syndrome    with edema---being treated.     Past Surgical History:  Procedure Laterality Date  . callous removal  Left 2017   located on 1st digit on L foot 2/2 diabetes  . EYE SURGERY      There were no vitals filed for this visit.  Subjective Assessment - 02/23/19 0807    Subjective   I got my foot debrided yesterday finally    Pertinent History  CVA 02/27/18, pelvic fx and Lt hip  fx 09/2018. PMH: HTN, OA    Limitations  Fall risk, wounds on feet    Currently in Pain?  No/denies        Transfer w/c to mat w/ min to mod assist. Seated edge of mat for most of session while working on trunk control. Contralateral and ipsilateral reaching slightly outside BOS, posterior semi reclined position (chair behind) to coming upright, bending over to touch feet and coming back up. Progressed to working trunk control through [employer] including: crossing leg over opposite thigh for donning and doffing  shoes (bilaterally), simulated LE dressing using theraband to don over feet and pull up, then stand to don over hips, then doff and sit to doff over feet w/ min assist.                      OT Short Term Goals - 02/19/19 1005      OT SHORT TERM GOAL #1   Title  Independent with initial HEP for shoulder ROM, and functional reaching LUE    Time  4    Period  Weeks    Status  Achieved      OT SHORT TERM GOAL #2   Title  Pt to perform UB dressing with set up only and LE dressing with mod assist or less using A/E prn    Time  4    Period  Weeks    Status  Achieved      OT SHORT TERM GOAL #3   Title  Pt to increase LUE functional use as evidenced by performing 20 blocks on Box & Blocks test.--STGs due 03/21/19    Baseline  11 blocks    Time  4    Period  Weeks    Status  On-going  01/27/19:  11 blocks     OT SHORT TERM GOAL #4   Title  Pt to perform toileting/clothes management with only min assist    Period  Weeks    Status  Achieved   Able to complete - simulation -  in clinic.  01/27/19:  met per pt/dtr report     OT SHORT TERM GOAL #5   Title  Pt to achieve 90* shoulder flexion LUE for mid level reaching    Time  4    Period  Weeks    Status  On-going   01/18/19:  75* with min compensation.  02/17/19:  85* with min compensation       OT Long Term Goals - 02/19/19 1005      OT LONG TERM GOAL #1   Title  Independent with updated HEP - due 04/20/19    Time  8    Period  Weeks    Status  On-going      OT LONG TERM GOAL #2   Title  Pt to be mod I with all BADLS using A/E as needed (except shower transfers to still be supervision)    Time  8    Period  Weeks    Status  On-going   02/17/19:  assist for compression stockings, min A for depends, supervision for shower transfer (mod I for bathing), toileting with BSC mod I     OT LONG TERM GOAL #3   Title  Pt to improve LUE function as evidenced by performing 25 blocks or greater on Box & Blocks test    Time   8    Period  Weeks    Status  On-going   02/17/19:  13     OT LONG TERM GOAL #4   Title  Pt to improve coordination as evidenced by performing 9 hole peg test completely in 90 sec or less    Baseline  placed 5 in 2 min.    Time  8    Period  Weeks    Status  On-going   02/17/19:  5 pegs within 62mn.  completed in 483m 43sec     OT LONG TERM GOAL #5   Title  Grip strength Lt hand to be 15 lbs or greater    Baseline  8 lbs    Time  8    Period  Weeks    Status  On-going   02/17/19:  5lbs     OT LONG TERM GOAL #6   Title  Pt to perform simple snack, cold food, and microwaveable food mod I level with DME prn without LOB    Time  8    Period  Weeks    Status  On-going   02/17/19:  not performing     OT LONG TERM GOAL #7   Title  Pt to achieve 110* or greater P/ROM Lt shoulder with pain less than or equal to 5/10    Time  8    Period  Weeks    Status  Achieved   02/17/19:  110-115* in sitting with 4-5/10 pain           Plan - 02/23/19 0856    Clinical Impression Statement  Pt progressing with sitting tolerance and trunk control.    Occupational performance deficits (Please refer to evaluation for details):  ADL's;IADL's;Leisure    Body Structure / Function / Physical Skills  ADL;ROM;Balance;IADL;Body mechanics;Improper spinal/pelvic alignment;Mobility;Strength;Flexibility;FMC;Coordination;UE functional use;Proprioception;Decreased knowledge of use of DME  Rehab Potential  Good    Comorbidities impacting occupational performance description:  Wounds bilateral feet, Healed recent hip and pelvic fx's    OT Frequency  2x / week    OT Duration  8 weeks    OT Treatment/Interventions  Self-care/ADL training;Therapeutic exercise;Functional Mobility Training;Neuromuscular education;Aquatic Therapy;Manual Therapy;Splinting;Therapeutic activities;Paraffin;DME and/or AE instruction;Cognitive remediation/compensation;Electrical Stimulation;Visual/perceptual remediation/compensation;Moist  Heat;Passive range of motion;Patient/family education    Plan  LUE function, trunk control, ADLs/simple snack prep    Consulted and Agree with Plan of Care  Patient;Family member/caregiver    Family Member Consulted  Daughter Unk Lightning)       Patient will benefit from skilled therapeutic intervention in order to improve the following deficits and impairments:   Body Structure / Function / Physical Skills: ADL, ROM, Balance, IADL, Body mechanics, Improper spinal/pelvic alignment, Mobility, Strength, Flexibility, FMC, Coordination, UE functional use, Proprioception, Decreased knowledge of use of DME       Visit Diagnosis: 1. Hemiplegia and hemiparesis following cerebral infarction affecting left non-dominant side ([place])   2. Muscle weakness (generalized)   3. Unsteadiness on feet   4. Stiffness of left shoulder, not elsewhere classified       Problem List Patient Active Problem List   Diagnosis Date Noted  . Iron deficiency anemia 02/03/2019  . Pelvic fracture ([place]) 09/21/2018  . Allergic reaction caused by a drug 05/06/2018  . Bilateral leg edema 04/13/2018  . HLD (hyperlipidemia) 03/23/2018  . Mood swings 03/23/2018  . Chronic kidney disease (CKD), stage III (moderate) ([facility])   . Embolic stroke ([place]) 67/89/3810  . Left hemiparesis ([place])   . Diabetes mellitus type 2 in nonobese ([facility])   . Acute ischemic stroke ([place])   . Left arm weakness   . Acute CVA (cerebrovascular accident) ([place]) 02/24/2018  . Overweight (BMI 25.0-29.9) 02/24/2018  . CKD (chronic kidney disease), stage III ([place]) 02/24/2018  . Diabetes mellitus without complication ([place]) 17/51/0258  . HTN (hypertension) 09/02/2017    Carey Bullocks, OTR/L 02/23/2019, 8:57 AM  [facility] [address] [place], [place], [address] Phone: [phone]   Fax:  [phone]  Name: [name] MRN: [id number] Date of Birth: [birth year]

## 2019-02-23 NOTE — Therapy (Signed)
[facility] [address] [place], [place], [address] Phone: [phone]   Fax:  [phone]  Physical Therapy Treatment  Patient Details  Name: [name] MRN: [id number] Date of Birth: Jun 11, 1946 Referring Provider (PT): Alysia Penna, MD   Encounter Date: 02/23/2019  PT End of Session - 02/23/19 1227    Visit Number  23    Number of Visits  40    Date for PT Re-Evaluation  04/19/19    Authorization Type  UHC Medicare, 10th visit progress note    PT Start Time  0850    PT Stop Time  0930    PT Time Calculation (min)  40 min    Activity Tolerance  Patient tolerated treatment well    Behavior During Therapy  [facility] for tasks assessed/performed       Past Medical History:  Diagnosis Date  . Allergy   . Arthritis   . Cataract   . Diabetes mellitus without complication ([place])   . Hypertension   . Macular degeneration syndrome    with edema---being treated.     Past Surgical History:  Procedure Laterality Date  . callous removal  Left 2017   located on 1st digit on L foot 2/2 diabetes  . EYE SURGERY      There were no vitals filed for this visit.  Subjective Assessment - 02/23/19 1219    Subjective  Pt reports being fatigued today after OT session.    Pertinent History  Patient had a R CVA 02/2018 and was walking with a SPC prior to a fall in March 2020 resulting in being hospitalized with multiple pelvic fxs and a L hip fx. During hospital stay, patient acquired pressure ulcers on bilateral soles of feet near heels.Patient is now safely able to bear weight and pressure ulcers are now a stage 1 (per daughter report and photos).    Patient Stated Goals  "Be back to normal, get back to walking, and getting strength back"    Currently in Pain?  No/denies           [facility] Adult PT Treatment/Exercise - 02/23/19 0001      Transfers   Transfers  Sit to Stand;Stand to Sit    Sit to Stand  5: Supervision     Sit to Stand Details  Verbal cues for technique   to push from<>reach back for arms   Stand to Sit  5: Supervision    Stand Pivot Transfers  4: Min assist    Stand Pivot Transfer Details (indicate cue type and reason)  without device      Ambulation/Gait   Ambulation/Gait  Yes    Ambulation/Gait Assistance  5: Supervision;4: Min guard    Ambulation Distance (Feet)  40 Feet   x 5 in // bars working on decreased UE support and posture   Assistive device  Parallel bars    Gait Pattern  Step-through pattern;Decreased step length - left;Decreased dorsiflexion - left;Decreased step length - right;Left foot flat;Right foot flat    Ambulation Surface  Level;Indoor    Gait Comments  Verbal cues and visual cues of mirror for upright posture. Worked on decreasing UE support.  Performed backward walking x 8' x 4 reps.        PT Short Term Goals - 02/18/19 0917      PT SHORT TERM GOAL #1   Title  Pt will demonstrate ability to perform ongoing HEP (stretching and standing exercises) with supervision  Time  4    Period  Weeks    Status  Revised    Target Date  03/20/19      PT SHORT TERM GOAL #2   Title  Patient will increase BERG score to at least a 30/56 in order to help decrease risk of future falls.    Baseline  26/30    Time  4    Period  Weeks    Status  Revised    Target Date  03/20/19      PT SHORT TERM GOAL #3   Title  Pt will perform stand pivot transfers with rollator x 5 reps with distant supervision    Time  4    Period  Weeks    Status  Revised    Target Date  03/20/19      PT SHORT TERM GOAL #4   Title  Pt will ambulate x 115' with LRAD on level, indoor surfaces with distant supervision    Baseline  min guard x 200' with rollator    Time  4    Period  Weeks    Status  Revised    Target Date  03/20/19      PT SHORT TERM GOAL #5   Title  Pt will improve gait velocity with LRAD to >/= 1.0 ft/sec    Baseline  .71    Time  4    Period  Weeks    Status  Revised     Target Date  03/20/19      PT SHORT TERM GOAL #6   Title  Patient will negotiate 4 stairs with 2 rails, step to sequence with supervision    Baseline  Min A    Time  4    Period  Weeks    Status  Revised    Target Date  03/20/19        PT Long Term Goals - 02/18/19 0827      PT LONG TERM GOAL #1   Title  Patient will report performing HEP atleast 5 days a week with husband's supervision    Baseline  daughter provides supervision/min A    Time  8    Period  Weeks    Status  Revised    Target Date  04/19/19      PT LONG TERM GOAL #2   Title  Pt will ambulate 230' with LRAD over indoor surfaces with supervision and will report ambulating short distances at home with LRAD and husband's supervision    Baseline  --    Time  8    Period  Weeks    Status  Revised    Target Date  04/19/19      PT LONG TERM GOAL #3   Title  Patient will increase BERG score to at least 34/56 in order to decrease risk of future falls.    Baseline  --    Time  8    Period  Weeks    Status  Revised    Target Date  04/19/19      PT LONG TERM GOAL #4   Title  Pt will perform sit <> stand and stand pivots with LRAD MOD I    Baseline  --    Time  8    Period  Weeks    Status  Revised    Target Date  04/19/19      PT LONG TERM GOAL #5   Title  Patient will increase  gait speed to at least 1.8t/sec with LRAD in order to decrease risk of falls and improve household ambulation.    Baseline  --    Time  8    Period  Weeks    Status  Revised    Target Date  04/19/19      PT LONG TERM GOAL #6   Title  Pt will ambulate x 100' outside over pavement with LRAD and supervision-min A    Baseline  --    Time  8    Period  Weeks    Status  Revised    Target Date  04/19/19            Plan - 02/23/19 1228    Clinical Impression Statement  Pt fatigued at begining of session and did not feel strong enough to walk with Rollator but agreed to activities in // bars.  As session progressed pt reported  increased endurance.  Session focused on gait and posture.  Continue PT per POC.    Rehab Potential  Good    PT Frequency  2x / week    PT Duration  8 weeks    PT Treatment/Interventions  ADLs/Self Care Home Management;Gait training;DME Instruction;Stair training;Functional mobility training;Therapeutic activities;Patient/family education;Neuromuscular re-education;Balance training;Therapeutic exercise;Passive range of motion;Orthotic Fit/Training    PT Next Visit Plan  Standing balance with decreased UE support; work towards pt performing HEP and ambulation short distances with husband's supervision at home when daughter not present.  Work on extending gait for endurance and mobility, L weight shifting in standing and forward weight shifting-rockerboard.  Step ups and step downs to address stairs.    PT Home Exercise Plan  [id number]    Consulted and Agree with Plan of Care  Patient;Family member/caregiver    Family Member Consulted  Daughter, [name]       Patient will benefit from skilled therapeutic intervention in order to improve the following deficits and impairments:  Abnormal gait, Decreased activity tolerance, Decreased balance, Decreased range of motion, Decreased mobility, Decreased endurance, Decreased skin integrity, Decreased strength, Difficulty walking, Impaired perceived functional ability, Postural dysfunction  Visit Diagnosis: 1. Hemiplegia and hemiparesis following cerebral infarction affecting left non-dominant side ([place])   2. Muscle weakness (generalized)   3. Unsteadiness on feet   4. Other abnormalities of gait and mobility        Problem List Patient Active Problem List   Diagnosis Date Noted  . Iron deficiency anemia 02/03/2019  . Pelvic fracture ([place]) 09/21/2018  . Allergic reaction caused by a drug 05/06/2018  . Bilateral leg edema 04/13/2018  . HLD (hyperlipidemia) 03/23/2018  . Mood swings 03/23/2018  . Chronic kidney disease (CKD), stage III (moderate)  ([facility])   . Embolic stroke ([place]) [id number]  . Left hemiparesis ([place])   . Diabetes mellitus type 2 in nonobese ([facility])   . Acute ischemic stroke ([place])   . Left arm weakness   . Acute CVA (cerebrovascular accident) ([place]) 02/24/2018  . Overweight (BMI 25.0-29.9) 02/24/2018  . CKD (chronic kidney disease), stage III ([place]) 02/24/2018  . Diabetes mellitus without complication ([place]) 0000000  . HTN (hypertension) 09/02/2017    Narda Bonds, PTA [place] 02/23/19 12:33 PM Phone: [phone] Fax: [facility] [address] [place], [place], [address] Phone: [phone]   Fax:  [phone]  Name: [name] MRN: [id number] Date of Birth: February 07, 1946

## 2019-02-24 ENCOUNTER — Encounter: Payer: Self-pay | Admitting: Occupational Therapy

## 2019-02-24 ENCOUNTER — Encounter: Payer: Self-pay | Admitting: Physical Therapy

## 2019-02-24 ENCOUNTER — Ambulatory Visit: Payer: Medicare Other | Admitting: Occupational Therapy

## 2019-02-24 ENCOUNTER — Other Ambulatory Visit: Payer: Self-pay | Admitting: Family Medicine

## 2019-02-24 ENCOUNTER — Ambulatory Visit: Payer: Medicare Other | Admitting: Physical Therapy

## 2019-02-24 ENCOUNTER — Other Ambulatory Visit: Payer: Self-pay

## 2019-02-24 ENCOUNTER — Encounter: Payer: Self-pay | Admitting: Family Medicine

## 2019-02-24 DIAGNOSIS — M6281 Muscle weakness (generalized): Secondary | ICD-10-CM

## 2019-02-24 DIAGNOSIS — R278 Other lack of coordination: Secondary | ICD-10-CM

## 2019-02-24 DIAGNOSIS — I69354 Hemiplegia and hemiparesis following cerebral infarction affecting left non-dominant side: Secondary | ICD-10-CM

## 2019-02-24 DIAGNOSIS — R2681 Unsteadiness on feet: Secondary | ICD-10-CM

## 2019-02-24 DIAGNOSIS — R2689 Other abnormalities of gait and mobility: Secondary | ICD-10-CM

## 2019-02-24 DIAGNOSIS — M25612 Stiffness of left shoulder, not elsewhere classified: Secondary | ICD-10-CM

## 2019-02-24 NOTE — Therapy (Signed)
[facility] [address] [place], [place], [address] Phone: [phone]   Fax:  [phone]  Occupational Therapy Treatment  Patient Details  Name: [name] MRN: [id number] Date of Birth: [birth year] Referring Provider (OT): Dr. Letta Pate   Encounter Date: 02/24/2019  OT End of Session - 02/24/19 1144    Visit Number  15    Number of Visits  29    Date for OT Re-Evaluation  04/20/19    Authorization Type  [employer] MCR    Authorization Time Period  cert. period 02/19/19-05/20/19    Authorization - Visit Number  15    Authorization - Number of Visits  20    OT Start Time  0802    OT Stop Time  7681    OT Time Calculation (min)  42 min    Activity Tolerance  Patient tolerated treatment well       Past Medical History:  Diagnosis Date  . Allergy   . Arthritis   . Cataract   . Diabetes mellitus without complication ([place])   . Hypertension   . Macular degeneration syndrome    with edema---being treated.     Past Surgical History:  Procedure Laterality Date  . callous removal  Left 2017   located on 1st digit on L foot 2/2 diabetes  . EYE SURGERY      There were no vitals filed for this visit.  Subjective Assessment - 02/24/19 0803    Subjective   I guess I do get pain when I move my arm    Patient is accompanied by:  Family member   daughter   Pertinent History  CVA 02/27/18, pelvic fx and Lt hip  fx 09/2018. PMH: HTN, OA    Limitations  Fall risk, wounds on feet    Currently in Pain?  Yes    Pain Score  6     Pain Location  Shoulder    Pain Orientation  Left    Pain Descriptors / Indicators  Aching;Sore    Pain Type  Chronic pain    Pain Onset  More than a month ago    Pain Frequency  Intermittent    Aggravating Factors   certain movements    Pain Relieving Factors  rest, avoiding those movements.                   OT Treatments/Exercises (OP) - 02/24/19 0941      ADLs   Functional  Mobility  Addressed stand step tranfers mat to chair and back.  WIth cues to bring head to left pt able to complete transfer with min a with BUE control on therapist's arms and cues for weight shifting.  Pt's postural alignment and control are signficantly impacted by pt's head position - pt uses R head tilt to compensate for poor righting reflex and disruption in vestibluar orientation of head and body in space.        Neurological Re-education Exercises   Other Exercises 1  Updated and modified HEP as pt reports pain when completing HEP exercises.  Pt exhibits R head tilt indicative of postural orientaiton compensation which creates greater malalignment in L shoulder.  Recommended that pt see neuro opth to evaluate and address prn - dtr and pt willing to drive to see Dr. Reginold Agent in [place], [place].  Contact information provided to pt and daughter and daughter to make appt.  See modified HEP for updates - including functional tasks  for pt to complete at home using LUE.  Pt able to complete all tasks without shoulder pain and demonstrates excellent potential for more functional return.              OT Education - 02/24/19 1142    Education Details  upgraded HEP; referral information given to neuro opth in [place], [place]    Person(s) Educated  Patient;Child(ren)    Methods  Explanation;Demonstration;Handout;Verbal cues    Comprehension  Verbalized understanding;Returned demonstration;Verbal cues required   dtr to provide vc's and assist prn      OT Short Term Goals - 02/19/19 1005      OT SHORT TERM GOAL #1   Title  Independent with initial HEP for shoulder ROM, and functional reaching LUE    Time  4    Period  Weeks    Status  Achieved      OT SHORT TERM GOAL #2   Title  Pt to perform UB dressing with set up only and LE dressing with mod assist or less using A/E prn    Time  4    Period  Weeks    Status  Achieved      OT SHORT TERM GOAL #3   Title  Pt to increase LUE  functional use as evidenced by performing 20 blocks on Box & Blocks test.--STGs due 03/21/19    Baseline  11 blocks    Time  4    Period  Weeks    Status  On-going   01/27/19:  11 blocks     OT SHORT TERM GOAL #4   Title  Pt to perform toileting/clothes management with only min assist    Period  Weeks    Status  Achieved   Able to complete - simulation -  in clinic.  01/27/19:  met per pt/dtr report     OT SHORT TERM GOAL #5   Title  Pt to achieve 90* shoulder flexion LUE for mid level reaching    Time  4    Period  Weeks    Status  On-going   01/18/19:  75* with min compensation.  02/17/19:  85* with min compensation       OT Long Term Goals - 02/19/19 1005      OT LONG TERM GOAL #1   Title  Independent with updated HEP - due 04/20/19    Time  8    Period  Weeks    Status  On-going      OT LONG TERM GOAL #2   Title  Pt to be mod I with all BADLS using A/E as needed (except shower transfers to still be supervision)    Time  8    Period  Weeks    Status  On-going   02/17/19:  assist for compression stockings, min A for depends, supervision for shower transfer (mod I for bathing), toileting with BSC mod I     OT LONG TERM GOAL #3   Title  Pt to improve LUE function as evidenced by performing 25 blocks or greater on Box & Blocks test    Time  8    Period  Weeks    Status  On-going   02/17/19:  13     OT LONG TERM GOAL #4   Title  Pt to improve coordination as evidenced by performing 9 hole peg test completely in 90 sec or less    Baseline  placed 5 in 2 min.  Time  8    Period  Weeks    Status  On-going   02/17/19:  5 pegs within 26mn.  completed in 461m 43sec     OT LONG TERM GOAL #5   Title  Grip strength Lt hand to be 15 lbs or greater    Baseline  8 lbs    Time  8    Period  Weeks    Status  On-going   02/17/19:  5lbs     OT LONG TERM GOAL #6   Title  Pt to perform simple snack, cold food, and microwaveable food mod I level with DME prn without LOB    Time  8     Period  Weeks    Status  On-going   02/17/19:  not performing     OT LONG TERM GOAL #7   Title  Pt to achieve 110* or greater P/ROM Lt shoulder with pain less than or equal to 5/10    Time  8    Period  Weeks    Status  Achieved   02/17/19:  110-115* in sitting with 4-5/10 pain           Plan - 02/24/19 1143    Clinical Impression Statement  Pt making progress toward goals and has excellent potential for increased functional use of LUE    OT Occupational Profile and History  Detailed Assessment- Review of Records and additional review of physical, cognitive, psychosocial history related to current functional performance    Occupational performance deficits (Please refer to evaluation for details):  ADL's;IADL's;Leisure    Body Structure / Function / Physical Skills  ADL;ROM;Balance;IADL;Body mechanics;Improper spinal/pelvic alignment;Mobility;Strength;Flexibility;FMC;Coordination;UE functional use;Proprioception;Decreased knowledge of use of DME    Rehab Potential  Good    Clinical Decision Making  Several treatment options, min-mod task modification necessary    Comorbidities Affecting Occupational Performance:  Presence of comorbidities impacting occupational performance    Comorbidities impacting occupational performance description:  Wounds bilateral feet, Healed recent hip and pelvic fx's    Modification or Assistance to Complete Evaluation   Min-Moderate modification of tasks or assist with assess necessary to complete eval    OT Frequency  2x / week    OT Duration  8 weeks    OT Treatment/Interventions  Self-care/ADL training;Therapeutic exercise;Functional Mobility Training;Neuromuscular education;Aquatic Therapy;Manual Therapy;Splinting;Therapeutic activities;Paraffin;DME and/or AE instruction;Cognitive remediation/compensation;Electrical Stimulation;Visual/perceptual remediation/compensation;Moist Heat;Passive range of motion;Patient/family education    Plan  LUE function,  trunk control, ADLs/simple snack prep    Consulted and Agree with Plan of Care  Patient;Family member/caregiver    Family Member Consulted  Daughter (LUnk Lightning      Patient will benefit from skilled therapeutic intervention in order to improve the following deficits and impairments:   Body Structure / Function / Physical Skills: ADL, ROM, Balance, IADL, Body mechanics, Improper spinal/pelvic alignment, Mobility, Strength, Flexibility, FMC, Coordination, UE functional use, Proprioception, Decreased knowledge of use of DME       Visit Diagnosis: 1. Hemiplegia and hemiparesis following cerebral infarction affecting left non-dominant side ([facility]  2. Muscle weakness (generalized)   3. Unsteadiness on feet   4. Stiffness of left shoulder, not elsewhere classified   5. Other lack of coordination       Problem List Patient Active Problem List   Diagnosis Date Noted  . Iron deficiency anemia 02/03/2019  . Pelvic fracture ([facility]03/16/2020  . Allergic reaction caused by a drug 05/06/2018  . Bilateral leg edema 04/13/2018  . HLD (hyperlipidemia)  03/23/2018  . Mood swings 03/23/2018  . Chronic kidney disease (CKD), stage III (moderate) (HCC)   . Embolic stroke ([place]) 12/50/8719  . Left hemiparesis ([place])   . Diabetes mellitus type 2 in nonobese (HCC)   . Acute ischemic stroke ([place])   . Left arm weakness   . Acute CVA (cerebrovascular accident) ([place]) 02/24/2018  . Overweight (BMI 25.0-29.9) 02/24/2018  . CKD (chronic kidney disease), stage III ([place]) 02/24/2018  . Diabetes mellitus without complication ([place]) 94/06/9046  . HTN (hypertension) 09/02/2017    Quay Burow, OTR/L 02/24/2019, 11:46 AM  [facility] [address] [place], [place], [address] Phone: [phone]   Fax:  [phone]  Name: [name] MRN: [id number] Date of Birth: 05-Nov-1945

## 2019-02-24 NOTE — Patient Instructions (Addendum)
Do these exercises at home every day 1-2 times per day!  ONLY work within pain free ranges.  Pain in the shoulder is NEVER good.  1. Sit on firm surface. With both arms reach toward your left foot - keep your head over your left knee.  Your daughter might need to help you with your head (cue first and then use your hand if you need to).  Do 10.  2. Using cane, work on putting your hand on the cane and pushing it forward until your elbow is straight, then pull the cane back toward your body. Only move at the arm, not your body.  Do 10, rest then do 10 more. You can also work on moving side to side, keeping your elbow straight. Do 10 rest then do 10 more.  Your daughter will need to sit beside you on your left and keep your head in alignment to avoid pain.  3. When you do the exercise where you move your hand out to the side, LOOK at your hand to help keep your head in alignment.   4. In the shower, use your LEFT hand to wash as much as possible FIRST. You can always go back over it with your right hand if you need to.  5.  During meals or whenever you have a drink, use 2 hands to hold your cup and drink.  Reach out with both hands to pick it up and to put it back down.  [facility]:  Neuro eye dr.  Dr. Sharlett Iles, OD,.  [phone]. [address] [place], [place]  [address] You should be able to just call and make the appt. Let them know Lucianne had a stroke and is using head tilting to compensate for postural orientation.

## 2019-02-24 NOTE — Therapy (Signed)
[facility] [address] [place], [place], [address] Phone: [phone]   Fax:  [phone]  Physical Therapy Treatment  Patient Details  Name: [name] MRN: [id number] Date of Birth: [birth year] Referring Provider (PT): Alysia Penna, MD   Encounter Date: 02/24/2019  PT End of Session - 02/24/19 0808    Visit Number  24    Number of Visits  40    Date for PT Re-Evaluation  04/19/19    Authorization Type  [employer] Medicare, 10th visit progress note    PT Start Time  0717    PT Stop Time  0800    PT Time Calculation (min)  43 min    Activity Tolerance  Patient tolerated treatment well    Behavior During Therapy  [facility] for tasks assessed/performed       Past Medical History:  Diagnosis Date  . Allergy   . Arthritis   . Cataract   . Diabetes mellitus without complication ([facility])   . Hypertension   . Macular degeneration syndrome    with edema---being treated.     Past Surgical History:  Procedure Laterality Date  . callous removal  Left 2017   located on 1st digit on L foot 2/2 diabetes  . EYE SURGERY      There were no vitals filed for this visit.  Subjective Assessment - 02/24/19 0719    Subjective  Went to see doctor to have heel debrided, still healing well.  Has better energy today, thinks that yesterday her fatigue was due to taking BP medication earlier.  Has been walking up and down the stairs and walking out to the car in the garage.    Pertinent History  Patient had a R CVA 02/2018 and was walking with a SPC prior to a fall in March 2020 resulting in being hospitalized with multiple pelvic fxs and a L hip fx. During hospital stay, patient acquired pressure ulcers on bilateral soles of feet near heels.Patient is now safely able to bear weight and pressure ulcers are now a stage 1 (per daughter report and photos).    Patient Stated Goals  "Be back to normal, get back to walking, and getting strength  back"    Currently in Pain?  No/denies                       [facility] Adult PT Treatment/Exercise - 02/24/19 0814      Therapeutic Activites    Therapeutic Activities  Other Therapeutic Activities    Other Therapeutic Activities  Daughter asking if pt will need therapy after September.  Discussed effects of prolonged immobility and time needed to reverse changes; also discussed PT long term goals including performing standing exercises, ambulation household distances with supervision and ambulating into and out of clinic.  Pt and daughter verbalized agreement.  Discussed head position in sitting and standing and how it affects posture and balance.        Shoulder Exercises: ROM/Strengthening   UBE (Upper Arm Bike)  In standing for standing tolerance and balance, 2:30 backwards, 2:00 forwards with therapist providing assistance to maintain grip on R side, for shoulder positioning, posture and balance.  One seated rest break          Balance Exercises - 02/24/19 0821      Balance Exercises: Standing   Rockerboard  Lateral;EO;UE support;5 reps    Other Standing Exercises  With rockerboard: performed lateral weight shifting to L  and R with bilat UE support focusing on pelvic and rib excursion laterally and elongation on R side while keeping head in midline - greater difficulty shifting to R and pt noted to be laterally flexing with head and trunk to R; pt also noted to shift too far to L and unable to find limits of stability.  Added in reaching laterally to L and R multiple repetitions with continued therapist assistance to maintain head in midline and for trunk elongation on R and controlled weight shift to L        PT Education - 02/24/19 0806    Education Details  plan to continue past September with therapy and goals for ongoing therapy, effects of immobilization, benefits of aerobic activity, head position affecting balance and posture    Person(s) Educated   Patient;Child(ren)    Methods  Explanation    Comprehension  Verbalized understanding       PT Short Term Goals - 02/18/19 0917      PT SHORT TERM GOAL #1   Title  Pt will demonstrate ability to perform ongoing HEP (stretching and standing exercises) with supervision    Time  4    Period  Weeks    Status  Revised    Target Date  03/20/19      PT SHORT TERM GOAL #2   Title  Patient will increase BERG score to at least a 30/56 in order to help decrease risk of future falls.    Baseline  26/30    Time  4    Period  Weeks    Status  Revised    Target Date  03/20/19      PT SHORT TERM GOAL #3   Title  Pt will perform stand pivot transfers with rollator x 5 reps with distant supervision    Time  4    Period  Weeks    Status  Revised    Target Date  03/20/19      PT SHORT TERM GOAL #4   Title  Pt will ambulate x 115' with LRAD on level, indoor surfaces with distant supervision    Baseline  min guard x 200' with rollator    Time  4    Period  Weeks    Status  Revised    Target Date  03/20/19      PT SHORT TERM GOAL #5   Title  Pt will improve gait velocity with LRAD to >/= 1.0 ft/sec    Baseline  .71    Time  4    Period  Weeks    Status  Revised    Target Date  03/20/19      PT SHORT TERM GOAL #6   Title  Patient will negotiate 4 stairs with 2 rails, step to sequence with supervision    Baseline  Min A    Time  4    Period  Weeks    Status  Revised    Target Date  03/20/19        PT Long Term Goals - 02/18/19 0827      PT LONG TERM GOAL #1   Title  Patient will report performing HEP atleast 5 days a week with husband's supervision    Baseline  daughter provides supervision/min A    Time  8    Period  Weeks    Status  Revised    Target Date  04/19/19      PT LONG TERM GOAL #  2   Title  Pt will ambulate 58' with LRAD over indoor surfaces with supervision and will report ambulating short distances at home with LRAD and husband's supervision    Baseline  --     Time  8    Period  Weeks    Status  Revised    Target Date  04/19/19      PT LONG TERM GOAL #3   Title  Patient will increase BERG score to at least 34/56 in order to decrease risk of future falls.    Baseline  --    Time  8    Period  Weeks    Status  Revised    Target Date  04/19/19      PT LONG TERM GOAL #4   Title  Pt will perform sit <> stand and stand pivots with LRAD MOD I    Baseline  --    Time  8    Period  Weeks    Status  Revised    Target Date  04/19/19      PT LONG TERM GOAL #5   Title  Patient will increase gait speed to at least 1.8t/sec with LRAD in order to decrease risk of falls and improve household ambulation.    Baseline  --    Time  8    Period  Weeks    Status  Revised    Target Date  04/19/19      PT LONG TERM GOAL #6   Title  Pt will ambulate x 100' outside over pavement with LRAD and supervision-min A    Baseline  --    Time  8    Period  Weeks    Status  Revised    Target Date  04/19/19            Plan - 02/24/19 0813    Clinical Impression Statement  Treatment session focused on continued progress towards goals of standing and balancing with decreased UE support utilizing UBE in standing for UE ROM and balance during dynamic UE movement.  Also continued to focus on standing balance, head and trunk righting, postural control in standing on rockerboard gradually decreasing UE support.  Pt continues to demonstrate increased head tilt to R and initiates movement with lateral flexion to R.  Discussed head position and effect on balance and posture - when discussed with OT pt may also have vestibular impairment and ocular tilt affecting subjective visual vertical.  Will continue to assess and address in order to progress towards LTG.    Rehab Potential  Good    PT Frequency  2x / week    PT Duration  8 weeks    PT Treatment/Interventions  ADLs/Self Care Home Management;Gait training;DME Instruction;Stair training;Functional mobility  training;Therapeutic activities;Patient/family education;Neuromuscular re-education;Balance training;Therapeutic exercise;Passive range of motion;Orthotic Fit/Training    PT Next Visit Plan  Working on reorientation to midline, decreasing R lateral head tilt, cervical proprioception.  Incorporate LUE use into session.  Standing balance with decreased UE support; work towards pt performing HEP and ambulation short distances with husband's supervision at home when daughter not present.  Work on extending gait for endurance and mobility, L weight shifting in standing and forward weight shifting-rockerboard.  Step ups and step downs to address stairs.    PT Home Exercise Plan  [id number]    Consulted and Agree with Plan of Care  Patient;Family member/caregiver    Family Member Consulted  Daughter, [name]  Patient will benefit from skilled therapeutic intervention in order to improve the following deficits and impairments:  Abnormal gait, Decreased activity tolerance, Decreased balance, Decreased range of motion, Decreased mobility, Decreased endurance, Decreased skin integrity, Decreased strength, Difficulty walking, Impaired perceived functional ability, Postural dysfunction  Visit Diagnosis: 1. Hemiplegia and hemiparesis following cerebral infarction affecting left non-dominant side ([place])   2. Muscle weakness (generalized)   3. Unsteadiness on feet   4. Other abnormalities of gait and mobility        Problem List Patient Active Problem List   Diagnosis Date Noted  . Iron deficiency anemia 02/03/2019  . Pelvic fracture ([place]) 09/21/2018  . Allergic reaction caused by a drug 05/06/2018  . Bilateral leg edema 04/13/2018  . HLD (hyperlipidemia) 03/23/2018  . Mood swings 03/23/2018  . Chronic kidney disease (CKD), stage III (moderate) (HCC)   . Embolic stroke ([place]) [id number]  . Left hemiparesis ([place])   . Diabetes mellitus type 2 in nonobese (HCC)   . Acute ischemic stroke ([name])   . Left  arm weakness   . Acute CVA (cerebrovascular accident) ([place]) 02/24/2018  . Overweight (BMI 25.0-29.9) 02/24/2018  . CKD (chronic kidney disease), stage III ([place]) 02/24/2018  . Diabetes mellitus without complication ([place]) 0000000  . HTN (hypertension) 09/02/2017    Rico Junker, PT, DPT 02/24/19    8:34 AM    [facility] [address] [name], [place], [address] Phone: [phone]   Fax:  [phone]  Name: [name] MRN: [id number] Date of Birth: 06-20-1946

## 2019-02-25 ENCOUNTER — Other Ambulatory Visit: Payer: Self-pay

## 2019-02-25 MED ORDER — GLUCOSE BLOOD VI STRP: ORAL_STRIP | 12 refills | Status: DC

## 2019-02-26 ENCOUNTER — Other Ambulatory Visit: Payer: Self-pay

## 2019-02-26 ENCOUNTER — Encounter: Payer: Self-pay | Admitting: Occupational Therapy

## 2019-02-26 ENCOUNTER — Ambulatory Visit: Payer: Medicare Other | Admitting: Occupational Therapy

## 2019-02-26 ENCOUNTER — Ambulatory Visit: Payer: Medicare Other | Admitting: Physical Therapy

## 2019-02-26 DIAGNOSIS — G8929 Other chronic pain: Secondary | ICD-10-CM

## 2019-02-26 DIAGNOSIS — I69354 Hemiplegia and hemiparesis following cerebral infarction affecting left non-dominant side: Secondary | ICD-10-CM

## 2019-02-26 DIAGNOSIS — M6281 Muscle weakness (generalized): Secondary | ICD-10-CM

## 2019-02-26 DIAGNOSIS — M25612 Stiffness of left shoulder, not elsewhere classified: Secondary | ICD-10-CM

## 2019-02-26 DIAGNOSIS — M25512 Pain in left shoulder: Secondary | ICD-10-CM

## 2019-02-26 DIAGNOSIS — R2681 Unsteadiness on feet: Secondary | ICD-10-CM

## 2019-02-26 DIAGNOSIS — R278 Other lack of coordination: Secondary | ICD-10-CM

## 2019-02-26 DIAGNOSIS — R2689 Other abnormalities of gait and mobility: Secondary | ICD-10-CM

## 2019-02-26 NOTE — Therapy (Signed)
[facility] [address] [place], [place], [address] Phone: [phone]   Fax:  [phone]  Occupational Therapy Treatment  Patient Details  Name: [name] MRN: [id number] Date of Birth: Jan 10, 1946 Referring Provider (OT): Dr. Letta Pate   Encounter Date: 02/26/2019  OT End of Session - 02/26/19 0841    Visit Number  16    Number of Visits  29    Date for OT Re-Evaluation  04/20/19    Authorization Type  U[employer] MCR    Authorization Time Period  cert. period 02/19/19-05/20/19    Authorization - Visit Number  45    Authorization - Number of Visits  20    OT Start Time  [phone]    OT Stop Time  0801    OT Time Calculation (min)  40 min    Activity Tolerance  Patient tolerated treatment well       Past Medical History:  Diagnosis Date  . Allergy   . Arthritis   . Cataract   . Diabetes mellitus without complication ([place])   . Hypertension   . Macular degeneration syndrome    with edema---being treated.     Past Surgical History:  Procedure Laterality Date  . callous removal  Left 2017   located on 1st digit on L foot 2/2 diabetes  . EYE SURGERY      There were no vitals filed for this visit.  Subjective Assessment - 02/26/19 0722    Subjective   Pt reports getting up better at home    Patient is accompanied by:  Family member   daughter   Pertinent History  CVA 02/27/18, pelvic fx and Lt hip  fx 09/2018. PMH: HTN, OA    Limitations  Fall risk, wounds on feet    Currently in Pain?  No/denies    Pain Onset  More than a month ago        Sitting, closed chain shoulder flex, chest press, and abduction with BUEs with ball with min facilitation and cueing for alignment and incr scapular movement.  Sitting, wt. Bearing through L hand with body on arm movements for incr wt. Shift, shoulder stability, and tricep activation with min facilitation/cueing.  Then midrange functional reaching with LUE to  grasp/release cylinder objects incorporating wt. Shift to the L.  Standing, with min facilitation/cues for midline alignment to place/remove clothespins with 1-2lb resistance on edge of box (low range) while standing with RUE support on chair.  Occasional min facilitation for L hand coordination/manipulation, encouraging improved posture/head positioning.  Sitting, mid-range reaching to place cylinder objects in pegboard with min cueing for positioning and set-up for wt. Shift to the L.             OT Short Term Goals - 02/19/19 1005      OT SHORT TERM GOAL #1   Title  Independent with initial HEP for shoulder ROM, and functional reaching LUE    Time  4    Period  Weeks    Status  Achieved      OT SHORT TERM GOAL #2   Title  Pt to perform UB dressing with set up only and LE dressing with mod assist or less using A/E prn    Time  4    Period  Weeks    Status  Achieved      OT SHORT TERM GOAL #3   Title  Pt to increase LUE functional use as evidenced by performing 20  blocks on Box & Blocks test.--STGs due 03/21/19    Baseline  11 blocks    Time  4    Period  Weeks    Status  On-going   01/27/19:  11 blocks     OT SHORT TERM GOAL #4   Title  Pt to perform toileting/clothes management with only min assist    Period  Weeks    Status  Achieved   Able to complete - simulation -  in clinic.  01/27/19:  met per pt/dtr report     OT SHORT TERM GOAL #5   Title  Pt to achieve 90* shoulder flexion LUE for mid level reaching    Time  4    Period  Weeks    Status  On-going   01/18/19:  75* with min compensation.  02/17/19:  85* with min compensation       OT Long Term Goals - 02/19/19 1005      OT LONG TERM GOAL #1   Title  Independent with updated HEP - due 04/20/19    Time  8    Period  Weeks    Status  On-going      OT LONG TERM GOAL #2   Title  Pt to be mod I with all BADLS using A/E as needed (except shower transfers to still be supervision)    Time  8    Period   Weeks    Status  On-going   02/17/19:  assist for compression stockings, min A for depends, supervision for shower transfer (mod I for bathing), toileting with BSC mod I     OT LONG TERM GOAL #3   Title  Pt to improve LUE function as evidenced by performing 25 blocks or greater on Box & Blocks test    Time  8    Period  Weeks    Status  On-going   02/17/19:  13     OT LONG TERM GOAL #4   Title  Pt to improve coordination as evidenced by performing 9 hole peg test completely in 90 sec or less    Baseline  placed 5 in 2 min.    Time  8    Period  Weeks    Status  On-going   02/17/19:  5 pegs within 69mn.  completed in 434m 43sec     OT LONG TERM GOAL #5   Title  Grip strength Lt hand to be 15 lbs or greater    Baseline  8 lbs    Time  8    Period  Weeks    Status  On-going   02/17/19:  5lbs     OT LONG TERM GOAL #6   Title  Pt to perform simple snack, cold food, and microwaveable food mod I level with DME prn without LOB    Time  8    Period  Weeks    Status  On-going   02/17/19:  not performing     OT LONG TERM GOAL #7   Title  Pt to achieve 110* or greater P/ROM Lt shoulder with pain less than or equal to 5/10    Time  8    Period  Weeks    Status  Achieved   02/17/19:  110-115* in sitting with 4-5/10 pain           Plan - 02/26/19 0841    Clinical Impression Statement  Pt is progressing towrads goals with improving activity tolerance.  Emphasized importance of incr functional activity at home.    OT Occupational Profile and History  Detailed Assessment- Review of Records and additional review of physical, cognitive, psychosocial history related to current functional performance    Occupational performance deficits (Please refer to evaluation for details):  ADL's;IADL's;Leisure    Body Structure / Function / Physical Skills  ADL;ROM;Balance;IADL;Body mechanics;Improper spinal/pelvic alignment;Mobility;Strength;Flexibility;FMC;Coordination;UE functional  use;Proprioception;Decreased knowledge of use of DME    Rehab Potential  Good    Clinical Decision Making  Several treatment options, min-mod task modification necessary    Comorbidities Affecting Occupational Performance:  Presence of comorbidities impacting occupational performance    Comorbidities impacting occupational performance description:  Wounds bilateral feet, Healed recent hip and pelvic fx's    Modification or Assistance to Complete Evaluation   Min-Moderate modification of tasks or assist with assess necessary to complete eval    OT Frequency  2x / week    OT Duration  8 weeks    OT Treatment/Interventions  Self-care/ADL training;Therapeutic exercise;Functional Mobility Training;Neuromuscular education;Aquatic Therapy;Manual Therapy;Splinting;Therapeutic activities;Paraffin;DME and/or AE instruction;Cognitive remediation/compensation;Electrical Stimulation;Visual/perceptual remediation/compensation;Moist Heat;Passive range of motion;Patient/family education    Plan  LUE function, trunk control, ADLs/simple snack prep    Consulted and Agree with Plan of Care  Patient;Family member/caregiver    Family Member Consulted  Daughter Unk Lightning)       Patient will benefit from skilled therapeutic intervention in order to improve the following deficits and impairments:   Body Structure / Function / Physical Skills: ADL, ROM, Balance, IADL, Body mechanics, Improper spinal/pelvic alignment, Mobility, Strength, Flexibility, FMC, Coordination, UE functional use, Proprioception, Decreased knowledge of use of DME       Visit Diagnosis: Hemiplegia and hemiparesis following cerebral infarction affecting left non-dominant side (HCC)  Muscle weakness (generalized)  Unsteadiness on feet  Stiffness of left shoulder, not elsewhere classified  Other lack of coordination  Other abnormalities of gait and mobility  Chronic left shoulder pain    Problem List Patient Active Problem List    Diagnosis Date Noted  . Iron deficiency anemia 02/03/2019  . Pelvic fracture ([place]) 09/21/2018  . Allergic reaction caused by a drug 05/06/2018  . Bilateral leg edema 04/13/2018  . HLD (hyperlipidemia) 03/23/2018  . Mood swings 03/23/2018  . Chronic kidney disease (CKD), stage III (moderate) (HCC)   . Embolic stroke ([place]) 09/47/0962  . Left hemiparesis ([place])   . Diabetes mellitus type 2 in nonobese (HCC)   . Acute ischemic stroke ([place])   . Left arm weakness   . Acute CVA (cerebrovascular accident) ([place]) 02/24/2018  . Overweight (BMI 25.0-29.9) 02/24/2018  . CKD (chronic kidney disease), stage III ([place]) 02/24/2018  . Diabetes mellitus without complication ([place]) 83/66/2947  . HTN (hypertension) 09/02/2017    [facility] 02/26/2019, 8:50 AM  [facility] [address] [place], [place], [address] Phone: [phone]   Fax:  [phone]  Name: [name] MRN: [id number] Date of Birth: 23-Jan-1946   Vianne Bulls, OTR/L [facility] [address] [place], [place]  [address] [phone] phone [phone] 02/26/19 9:18 AM

## 2019-02-26 NOTE — Therapy (Signed)
[facility] [address] [place], [place], [address] Phone: [phone]   Fax:  [phone]  Physical Therapy Treatment  Patient Details  Name: [name] MRN: [id number] Date of Birth: September 03, 1945 Referring Provider (PT): Alysia Penna, MD   Encounter Date: 02/26/2019  PT End of Session - 02/26/19 1356    Visit Number  25    Number of Visits  40    Date for PT Re-Evaluation  04/19/19    Authorization Type  [employer]HC Medicare, 10th visit progress note    PT Start Time  0800    PT Stop Time  0850    PT Time Calculation (min)  50 min    Activity Tolerance  Patient tolerated treatment well    Behavior During Therapy  [facility] for tasks assessed/performed       Past Medical History:  Diagnosis Date  . Allergy   . Arthritis   . Cataract   . Diabetes mellitus without complication ([place])   . Hypertension   . Macular degeneration syndrome    with edema---being treated.     Past Surgical History:  Procedure Laterality Date  . callous removal  Left 2017   located on 1st digit on L foot 2/2 diabetes  . EYE SURGERY      There were no vitals filed for this visit.  Subjective Assessment - 02/26/19 0829    Subjective  Made an appointment with the neuro-ophthalmologist in [place] in September.    Pertinent History  Patient had a R CVA 02/2018 and was walking with a SPC prior to a fall in March 2020 resulting in being hospitalized with multiple pelvic fxs and a L hip fx. During hospital stay, patient acquired pressure ulcers on bilateral soles of feet near heels.Patient is now safely able to bear weight and pressure ulcers are now a stage 1 (per daughter report and photos).    Patient Stated Goals  "Be back to normal, get back to walking, and getting strength back"    Currently in Pain?  No/denies                       [facility] Adult PT Treatment/Exercise - 02/26/19 1347      Transfers   Sit to Stand  5:  Supervision    Stand to Sit  4: Min guard    Stand Pivot Transfers  4: Min Doctor, general practice Details (indicate cue type and reason)  keep head elevated and assistance with weight shifting    Supine to Sit  4: Min assist    Sit to Supine  4: Min assist      Neuro Re-ed    Neuro Re-ed Details   Cervical proprioception and head righting training in supine and sitting with laser on head and using targets for visual feedback on head position.  In supine performed rotations to L<>midline and R <> midline; imagined targets with rotation to L and R, maintaining head rotation to L with neck flexion and extension and neck flexion and extension with head in midline.  Transitioned to sitting with laser with L hip wedged for increased shortening of trunk and performing head tilts to midline, rotation L <> midline and rotation to L with cervical and thoracic extension for more upright posture.  Transitioned to leaning on L elbow and performing shoulder depression for active trunk shortening and active lateral flexion to L with rotation to L and R  along target.               PT Short Term Goals - 02/18/19 0917      PT SHORT TERM GOAL #1   Title  Pt will demonstrate ability to perform ongoing HEP (stretching and standing exercises) with supervision    Time  4    Period  Weeks    Status  Revised    Target Date  03/20/19      PT SHORT TERM GOAL #2   Title  Patient will increase BERG score to at least a 30/56 in order to help decrease risk of future falls.    Baseline  26/30    Time  4    Period  Weeks    Status  Revised    Target Date  03/20/19      PT SHORT TERM GOAL #3   Title  Pt will perform stand pivot transfers with rollator x 5 reps with distant supervision    Time  4    Period  Weeks    Status  Revised    Target Date  03/20/19      PT SHORT TERM GOAL #4   Title  Pt will ambulate x 115' with LRAD on level, indoor surfaces with distant supervision    Baseline  min guard  x 200' with rollator    Time  4    Period  Weeks    Status  Revised    Target Date  03/20/19      PT SHORT TERM GOAL #5   Title  Pt will improve gait velocity with LRAD to >/= 1.0 ft/sec    Baseline  .71    Time  4    Period  Weeks    Status  Revised    Target Date  03/20/19      PT SHORT TERM GOAL #6   Title  Patient will negotiate 4 stairs with 2 rails, step to sequence with supervision    Baseline  Min A    Time  4    Period  Weeks    Status  Revised    Target Date  03/20/19        PT Long Term Goals - 02/18/19 0827      PT LONG TERM GOAL #1   Title  Patient will report performing HEP atleast 5 days a week with husband's supervision    Baseline  daughter provides supervision/min A    Time  8    Period  Weeks    Status  Revised    Target Date  04/19/19      PT LONG TERM GOAL #2   Title  Pt will ambulate 230' with LRAD over indoor surfaces with supervision and will report ambulating short distances at home with LRAD and husband's supervision    Baseline  --    Time  8    Period  Weeks    Status  Revised    Target Date  04/19/19      PT LONG TERM GOAL #3   Title  Patient will increase BERG score to at least 34/56 in order to decrease risk of future falls.    Baseline  --    Time  8    Period  Weeks    Status  Revised    Target Date  04/19/19      PT LONG TERM GOAL #4   Title  Pt will perform sit <> stand and  stand pivots with LRAD MOD I    Baseline  --    Time  8    Period  Weeks    Status  Revised    Target Date  04/19/19      PT LONG TERM GOAL #5   Title  Patient will increase gait speed to at least 1.8t/sec with LRAD in order to decrease risk of falls and improve household ambulation.    Baseline  --    Time  8    Period  Weeks    Status  Revised    Target Date  04/19/19      PT LONG TERM GOAL #6   Title  Pt will ambulate x 100' outside over pavement with LRAD and supervision-min A    Baseline  --    Time  8    Period  Weeks    Status   Revised    Target Date  04/19/19            Plan - 02/26/19 1356    Clinical Impression Statement  Initiated cervical proprioception and head righting training in supine and sitting with use of laser.  No difficulty with movements to L when in supine but demonstrated increased difficulty motor planning and executing lateral flexion, extension and rotation to L in sitting with pelvis wedged and LUE supported.  Will continue to address in order to progress towards LTG.    Rehab Potential  Good    PT Frequency  3x / week    PT Duration  8 weeks    PT Treatment/Interventions  ADLs/Self Care Home Management;Gait training;DME Instruction;Stair training;Functional mobility training;Therapeutic activities;Patient/family education;Neuromuscular re-education;Balance training;Therapeutic exercise;Passive range of motion;Orthotic Fit/Training    PT Next Visit Plan  Working on reorientation to midline, decreasing R lateral head tilt, cervical proprioception.  Incorporate LUE use into session.  Standing balance with decreased UE support; work towards pt performing HEP and ambulation short distances with husband's supervision at home when daughter not present.  Work on extending gait for endurance and mobility, L weight shifting in standing and forward weight shifting-rockerboard.  Step ups and step downs to address stairs.    PT Home Exercise Plan  [id number]    Consulted and Agree with Plan of Care  Patient;Family member/caregiver    Family Member Consulted  Daughter, [name]       Patient will benefit from skilled therapeutic intervention in order to improve the following deficits and impairments:  Abnormal gait, Decreased activity tolerance, Decreased balance, Decreased range of motion, Decreased mobility, Decreased endurance, Decreased skin integrity, Decreased strength, Difficulty walking, Impaired perceived functional ability, Postural dysfunction  Visit Diagnosis: Hemiplegia and hemiparesis following  cerebral infarction affecting left non-dominant side ([place]) - Plan: PT plan of care cert/re-cert  Unsteadiness on feet - Plan: PT plan of care cert/re-cert  Other abnormalities of gait and mobility - Plan: PT plan of care cert/re-cert  Muscle weakness (generalized) - Plan: PT plan of care cert/re-cert     Problem List Patient Active Problem List   Diagnosis Date Noted  . Iron deficiency anemia 02/03/2019  . Pelvic fracture ([place]) 09/21/2018  . Allergic reaction caused by a drug 05/06/2018  . Bilateral leg edema 04/13/2018  . HLD (hyperlipidemia) 03/23/2018  . Mood swings 03/23/2018  . Chronic kidney disease (CKD), stage III (moderate) (HCC)   . Embolic stroke ([place]) [id number]  . Left hemiparesis ([place])   . Diabetes mellitus type 2 in nonobese (HCC)   . Acute ischemic stroke (  [place])   . Left arm weakness   . Acute CVA (cerebrovascular accident) ([place]) 02/24/2018  . Overweight (BMI 25.0-29.9) 02/24/2018  . CKD (chronic kidney disease), stage III ([place]) 02/24/2018  . Diabetes mellitus without complication ([place]) 0000000  . HTN (hypertension) 09/02/2017    Rico Junker, PT, DPT 02/26/19    2:03 PM    [facility] [address] [place], [place], [address] Phone: [phone]   Fax:  [phone]  Name: RAKEB BUCKNAM MRN: [id number] Date of Birth: 08-Sep-1945

## 2019-03-01 ENCOUNTER — Other Ambulatory Visit: Payer: Self-pay

## 2019-03-01 ENCOUNTER — Encounter: Payer: Self-pay | Admitting: Physical Medicine & Rehabilitation

## 2019-03-01 ENCOUNTER — Encounter: Payer: Medicare Other | Attending: Physical Medicine & Rehabilitation | Admitting: Physical Medicine & Rehabilitation

## 2019-03-01 VITALS — BP 171/84 | HR 70 | Temp 98.7°F | Ht 62.0 in | Wt 165.0 lb

## 2019-03-01 DIAGNOSIS — H539 Unspecified visual disturbance: Secondary | ICD-10-CM | POA: Diagnosis not present

## 2019-03-01 DIAGNOSIS — I129 Hypertensive chronic kidney disease with stage 1 through stage 4 chronic kidney disease, or unspecified chronic kidney disease: Secondary | ICD-10-CM | POA: Insufficient documentation

## 2019-03-01 DIAGNOSIS — G8114 Spastic hemiplegia affecting left nondominant side: Secondary | ICD-10-CM | POA: Diagnosis present

## 2019-03-01 DIAGNOSIS — M1990 Unspecified osteoarthritis, unspecified site: Secondary | ICD-10-CM | POA: Diagnosis not present

## 2019-03-01 DIAGNOSIS — R531 Weakness: Secondary | ICD-10-CM | POA: Diagnosis not present

## 2019-03-01 DIAGNOSIS — R450 Nervousness: Secondary | ICD-10-CM | POA: Insufficient documentation

## 2019-03-01 DIAGNOSIS — R262 Difficulty in walking, not elsewhere classified: Secondary | ICD-10-CM | POA: Insufficient documentation

## 2019-03-01 DIAGNOSIS — M25512 Pain in left shoulder: Secondary | ICD-10-CM | POA: Insufficient documentation

## 2019-03-01 DIAGNOSIS — F419 Anxiety disorder, unspecified: Secondary | ICD-10-CM | POA: Diagnosis not present

## 2019-03-01 DIAGNOSIS — I69398 Other sequelae of cerebral infarction: Secondary | ICD-10-CM

## 2019-03-01 DIAGNOSIS — E1122 Type 2 diabetes mellitus with diabetic chronic kidney disease: Secondary | ICD-10-CM | POA: Insufficient documentation

## 2019-03-01 NOTE — Patient Instructions (Signed)
Would increase Botox dose to Left pectoralis and elbow flexors

## 2019-03-01 NOTE — Progress Notes (Signed)
Subjective:    Patient ID: [name], female    DOB: 1946-03-16, 73 y.o.   MRN: [id number] 73 year old female who sustained a right M2 distribution infarct as well as scattered smaller infarcts in the left frontoparietal right insula on 02/24/2018.  Her past medical history is significant for hypertension diabetes chronic kidney disease Further work-up while hospitalized demonstrated severely calcified mitral valve with grade 2 diastolic dysfunction.  Embolic stroke was suspected.  Patient declined TEE and loop recorder.  Neurology recommended dual antiplatelet agents for 3 weeks then aspirin alone. She completed her inpatient rehabilitation program and was discharged home on 02/27/2018.  She was discharged at a contact-guard assist level for her ADLs and mobility.  She used a straight cane HPI  Chief complaint left-sided weakness  73 year old female with right MCA distribution infarct.  She is currently going to outpatient therapy at [facility].  Patient indicates that her left hand and wrist are feeling much looser.  No complaints of shoulder pain today. Daughter is with the patient today.  She discussed recommendations from OT to pursue further assessment for visual disturbance following stroke.  The patient has been observed to tilt her head while standing. Patient denies double vision.  No headaches.  No tendency to close one eye or the other. No problems with lid closure. Botox injection 300 units on 12/31/2018 into the left upper extremity  LEFT  Pec 100 Bic 75 BR 25 FDS 25 FDP50 FCR 25   Pain Inventory Average Pain 0 Pain Right Now 0 My pain is na  In the last 24 hours, has pain interfered with the following? General activity 0 Relation with others 0 Enjoyment of life 0 What TIME of day is your pain at its worst? na Sleep (in general) Good  Pain is worse with: na Pain improves with: na Relief from Meds: na  Mobility walk with assistance ability to climb steps?   yes  Function retired  Neuro/Psych trouble walking  Prior Studies Any changes since last visit?  no  Physicians involved in your care Any changes since last visit?  no   Family History  Problem Relation Age of Onset  . Kidney disease Mother   . Congestive Heart Failure Father   . COPD Father   . COPD Brother   . Diabetes Maternal Aunt    Social History   Socioeconomic History  . Marital status: Married    Spouse name: Rennis Harding  . Number of children: 2  . 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 Highest education level: High school graduate  Occupational History  . Not on file  Social Needs  . Financial resource strain: Somewhat hard  . Food insecurity    Worry: Never true    Inability: Never true  . Transportation needs    Medical: No    Non-medical: No  Tobacco Use  . Smoking status: Never Smoker  . Smokeless tobacco: Never Used  Substance and Sexual Activity  . Alcohol use: No  . Drug use: No  . Sexual activity: Not Currently  Lifestyle  . Physical activity    Days per week: 0 days    Minutes per session: 0 min  . Stress: Not at all  Relationships  . Social connections    Talks on phone: More than three times a week    Gets together: Twice a week    Attends religious service: Never    Active member of  club or organization: No    Attends meetings of clubs or organizations: Never    Relationship status: Married  Other Topics Concern  . Not on file  Social History Narrative  . Not on file   Past Surgical History:  Procedure Laterality Date  . callous removal  Left 2017   located on 1st digit on L foot 2/2 diabetes  . EYE SURGERY     Past Medical History:  Diagnosis Date  . Allergy   . Arthritis   . Cataract   . Diabetes mellitus without complication ([place])   . Hypertension   . Macular degeneration syndrome    with edema---being treated.    BP (!) 171/84   Pulse 70   Temp 98.7 F (37.1 C)   Ht 5\' 2"   (1.575 m)   Wt 165 lb (74.8 kg)   SpO2 95%   BMI 30.18 kg/m   Opioid Risk Score:   Fall Risk Score:  `1  Depression screen PHQ 2/9  Depression screen [facility] 2/9 12/31/2018 12/15/2018 02/18/2018 09/02/2017  Decreased Interest 0 0 1 0  Down, Depressed, Hopeless 0 0 1 0  PHQ - 2 Score 0 0 2 0  Altered sleeping - - 1 -  Tired, decreased energy - - 2 -  Change in appetite - - 0 -  Feeling bad or failure about yourself  - - 1 -  Trouble concentrating - - 0 -  Moving slowly or fidgety/restless - - 0 -  Suicidal thoughts - - 0 -  PHQ-9 Score - - 6 -     Review of Systems  Constitutional: Negative.   HENT: Negative.   Eyes: Negative.   Respiratory: Negative.   Cardiovascular: Negative.   Gastrointestinal: Negative.   Endocrine: Negative.   Genitourinary: Negative.   Musculoskeletal: Positive for gait problem.  Skin: Negative.   Allergic/Immunologic: Negative.   Hematological: Negative.   Psychiatric/Behavioral: Negative.   All other systems reviewed and are negative.      Objective:   Physical Exam  Cranial nerves Cranial nerve III no evidence of ptosis patient has intact superior and inferior gaze as well as adduction Cranial nerve IV no evidence of skew deviation.  No head tilt observed in sitting Cranial nerve VI no pupillary abduction weakness There is no evidence of nystagmus.  Sitting posture is poor she tends to lean toward the left side. Motor strength is to minus at the deltoid 3- at the elbow flexors to minus elbow extensors to minus at the finger flexors and 2 at the finger extensors 3 at the wrist extensors 2 at the wrist flexors. Tone MAS 3 at the pectoralis MAS 2 at the elbow flexors MAS 0 at the finger flexors and wrist flexors. Left shoulder has decreased external rotation as well as abduction.  Mild pain        Assessment & Plan:  1.  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 Would recommend increasing the left pectoralis dose to 175 units Increase brachio radialis to 50 units Remainder of dosing to remain the same   Pec 175 Bic 75 BR 50 FDS 25 FDP50 FCR 25  Repeat around 04/02/2019  2.  Head tilted toward the left I believe this is more postural.  Based on her lesion location I would not  expect a cranial nerve IV deficit which commonly causes skew deviation with compensatory head tilt.  These lesions are in the pons or midbrain location

## 2019-03-03 ENCOUNTER — Encounter: Payer: Self-pay | Admitting: Physical Therapy

## 2019-03-03 ENCOUNTER — Encounter: Payer: Self-pay | Admitting: Occupational Therapy

## 2019-03-03 ENCOUNTER — Ambulatory Visit: Payer: Medicare Other | Admitting: Occupational Therapy

## 2019-03-03 ENCOUNTER — Ambulatory Visit: Payer: Medicare Other | Admitting: Physical Therapy

## 2019-03-03 ENCOUNTER — Other Ambulatory Visit: Payer: Self-pay

## 2019-03-03 DIAGNOSIS — I69354 Hemiplegia and hemiparesis following cerebral infarction affecting left non-dominant side: Secondary | ICD-10-CM

## 2019-03-03 DIAGNOSIS — R2689 Other abnormalities of gait and mobility: Secondary | ICD-10-CM

## 2019-03-03 DIAGNOSIS — R2681 Unsteadiness on feet: Secondary | ICD-10-CM

## 2019-03-03 DIAGNOSIS — G8929 Other chronic pain: Secondary | ICD-10-CM

## 2019-03-03 DIAGNOSIS — M25512 Pain in left shoulder: Secondary | ICD-10-CM

## 2019-03-03 DIAGNOSIS — R278 Other lack of coordination: Secondary | ICD-10-CM

## 2019-03-03 DIAGNOSIS — M25612 Stiffness of left shoulder, not elsewhere classified: Secondary | ICD-10-CM

## 2019-03-03 DIAGNOSIS — M6281 Muscle weakness (generalized): Secondary | ICD-10-CM

## 2019-03-03 NOTE — Therapy (Signed)
[facility] [address] [place], [place], [address] Phone: [phone]   Fax:  [phone]  Occupational Therapy Treatment  Patient Details  Name: [name] MRN: [id number] Date of Birth: [birth year] Referring Provider (OT): Dr. Letta Pate   Encounter Date: 03/03/2019  OT End of Session - 03/03/19 1311    Visit Number  17    Number of Visits  29    Date for OT Re-Evaluation  04/20/19    Authorization Type  [employer] MCR    Authorization Time Period  cert. period 02/19/19-05/20/19    Authorization - Visit Number  37    Authorization - Number of Visits  20    OT Start Time  0805    OT Stop Time  0845    OT Time Calculation (min)  40 min    Activity Tolerance  Patient tolerated treatment well    Behavior During Therapy  [facility] for tasks assessed/performed       Past Medical History:  Diagnosis Date  . Allergy   . Arthritis   . Cataract   . Diabetes mellitus without complication ([place])   . Hypertension   . Macular degeneration syndrome    with edema---being treated.     Past Surgical History:  Procedure Laterality Date  . callous removal  Left 2017   located on 1st digit on L foot 2/2 diabetes  . EYE SURGERY      There were no vitals filed for this visit.  Subjective Assessment - 03/03/19 1247    Subjective   Pt reports that she is going up stairs more    Patient is accompanied by:  Family member   daughter   Pertinent History  CVA 02/27/18, pelvic fx and Lt hip  fx 09/2018. PMH: HTN, OA    Limitations  Fall risk, wounds on feet    Currently in Pain?  No/denies    Pain Onset  More than a month ago          Practiced simulated snack prep in the kitchen:  Using feet to propel w/c into and in kitchen, standing at sink with UE support and reaching in cabinets/across body with min-mod A for balance, then tearing paper towel with LUE to simulate opening packages with min difficulty, opening lower  cabinets/drawers with LUE with min difficulty, fridge with min A with initiation, and dishwasher with mod A due to decr grip strength and coordination for hand positioning.  Encouraged pt to open drawers/cabinets, doorknobs, fridge at home.  Pt/dtr verbalized understanding.   Sitting, closed chain shoulder flexand abduction with BUEs with ball with min facilitation and cueing for alignment and incr scapular movement.  Sitting, mid-range reaching to place cylinder objects in pegboard with min cueing/mod difficulty for positioning, in-hand manipulation and set-up for wt. Shift to the L.            OT Short Term Goals - 03/03/19 1313      OT SHORT TERM GOAL #1   Title  Independent with initial HEP for shoulder ROM, and functional reaching LUE    Time  4    Period  Weeks    Status  Achieved      OT SHORT TERM GOAL #2   Title  Pt to perform UB dressing with set up only and LE dressing with mod assist or less using A/E prn    Time  4    Period  Weeks    Status  Achieved  OT SHORT TERM GOAL #3   Title  Pt to increase LUE functional use as evidenced by performing 20 blocks on Box & Blocks test.--STGs due 03/21/19    Baseline  11 blocks    Time  4    Period  Weeks    Status  On-going   01/27/19:  11 blocks     OT SHORT TERM GOAL #4   Title  Pt to perform toileting/clothes management with only min assist    Period  Weeks    Status  Achieved   Able to complete - simulation -  in clinic.  01/27/19:  met per pt/dtr report     OT SHORT TERM GOAL #5   Title  Pt to achieve 90* shoulder flexion LUE for mid level reaching    Time  4    Period  Weeks    Status  On-going   01/18/19:  75* with min compensation.  02/17/19:  85* with min compensation       OT Long Term Goals - 02/19/19 1005      OT LONG TERM GOAL #1   Title  Independent with updated HEP - due 04/20/19    Time  8    Period  Weeks    Status  On-going      OT LONG TERM GOAL #2   Title  Pt to be mod I with all BADLS  using A/E as needed (except shower transfers to still be supervision)    Time  8    Period  Weeks    Status  On-going   02/17/19:  assist for compression stockings, min A for depends, supervision for shower transfer (mod I for bathing), toileting with BSC mod I     OT LONG TERM GOAL #3   Title  Pt to improve LUE function as evidenced by performing 25 blocks or greater on Box & Blocks test    Time  8    Period  Weeks    Status  On-going   02/17/19:  13     OT LONG TERM GOAL #4   Title  Pt to improve coordination as evidenced by performing 9 hole peg test completely in 90 sec or less    Baseline  placed 5 in 2 min.    Time  8    Period  Weeks    Status  On-going   02/17/19:  5 pegs within 32mn.  completed in 445m 43sec     OT LONG TERM GOAL #5   Title  Grip strength Lt hand to be 15 lbs or greater    Baseline  8 lbs    Time  8    Period  Weeks    Status  On-going   02/17/19:  5lbs     OT LONG TERM GOAL #6   Title  Pt to perform simple snack, cold food, and microwaveable food mod I level with DME prn without LOB    Time  8    Period  Weeks    Status  On-going   02/17/19:  not performing     OT LONG TERM GOAL #7   Title  Pt to achieve 110* or greater P/ROM Lt shoulder with pain less than or equal to 5/10    Time  8    Period  Weeks    Status  Achieved   02/17/19:  110-115* in sitting with 4-5/10 pain           Plan -  03/03/19 1312    Clinical Impression Statement  Pt is slowly progressing towards goals with improving posture and activity tolerance.    OT Occupational Profile and History  Detailed Assessment- Review of Records and additional review of physical, cognitive, psychosocial history related to current functional performance    Occupational performance deficits (Please refer to evaluation for details):  ADL's;IADL's;Leisure    Body Structure / Function / Physical Skills  ADL;ROM;Balance;IADL;Body mechanics;Improper spinal/pelvic  alignment;Mobility;Strength;Flexibility;FMC;Coordination;UE functional use;Proprioception;Decreased knowledge of use of DME    Rehab Potential  Good    Clinical Decision Making  Several treatment options, min-mod task modification necessary    Comorbidities Affecting Occupational Performance:  Presence of comorbidities impacting occupational performance    Comorbidities impacting occupational performance description:  Wounds bilateral feet, Healed recent hip and pelvic fx's    Modification or Assistance to Complete Evaluation   Min-Moderate modification of tasks or assist with assess necessary to complete eval    OT Frequency  2x / week    OT Duration  8 weeks    OT Treatment/Interventions  Self-care/ADL training;Therapeutic exercise;Functional Mobility Training;Neuromuscular education;Aquatic Therapy;Manual Therapy;Splinting;Therapeutic activities;Paraffin;DME and/or AE instruction;Cognitive remediation/compensation;Electrical Stimulation;Visual/perceptual remediation/compensation;Moist Heat;Passive range of motion;Patient/family education    Plan  LUE function, trunk control    Consulted and Agree with Plan of Care  Patient;Family member/caregiver    Family Member Consulted  Daughter Unk Lightning)       Patient will benefit from skilled therapeutic intervention in order to improve the following deficits and impairments:   Body Structure / Function / Physical Skills: ADL, ROM, Balance, IADL, Body mechanics, Improper spinal/pelvic alignment, Mobility, Strength, Flexibility, FMC, Coordination, UE functional use, Proprioception, Decreased knowledge of use of DME       Visit Diagnosis: Hemiplegia and hemiparesis following cerebral infarction affecting left non-dominant side (HCC)  Muscle weakness (generalized)  Unsteadiness on feet  Other abnormalities of gait and mobility  Stiffness of left shoulder, not elsewhere classified  Other lack of coordination  Chronic left shoulder  pain    Problem List Patient Active Problem List   Diagnosis Date Noted  . Iron deficiency anemia 02/03/2019  . Pelvic fracture ([place]) 09/21/2018  . Allergic reaction caused by a drug 05/06/2018  . Bilateral leg edema 04/13/2018  . HLD (hyperlipidemia) 03/23/2018  . Mood swings 03/23/2018  . Chronic kidney disease (CKD), stage III (moderate) (HCC)   . Embolic stroke ([place]) 83/23/4688  . Left hemiparesis ([place])   . Diabetes mellitus type 2 in nonobese (HCC)   . Acute ischemic stroke ([place])   . Left arm weakness   . Acute CVA (cerebrovascular accident) ([place]) 02/24/2018  . Overweight (BMI 25.0-29.9) 02/24/2018  . CKD (chronic kidney disease), stage III ([place]) 02/24/2018  . Diabetes mellitus without complication ([place]) 73/73/0816  . HTN (hypertension) 09/02/2017    [facility] 03/03/2019, 1:13 PM  [facility] [address] [place], [place], [address] Phone: [phone]   Fax:  [phone]  Name: ALIN HUTCHINS MRN: [id number] Date of Birth: 1945/10/13   Vianne Bulls, OTR/L [facility] [address] [place], [place]  [address] [phone] phone [phone] 03/03/19 1:13 PM

## 2019-03-03 NOTE — Therapy (Signed)
[facility] [address] [place], [place], [address] Phone: [phone]   Fax:  [phone]  Physical Therapy Treatment  Patient Details  Name: [name] MRN: [id number] Date of Birth: Jan 20, 1946 Referring Provider (PT): Alysia Penna, MD   Encounter Date: 03/03/2019  PT End of Session - 03/03/19 0817    Visit Number  26    Number of Visits  40    Date for PT Re-Evaluation  04/19/19    Authorization Type  [employer] Medicare, 10th visit progress note    PT Start Time  0715    PT Stop Time  0800    PT Time Calculation (min)  45 min    Equipment Utilized During Treatment  Other (comment)   head laser   Activity Tolerance  Patient tolerated treatment well    Behavior During Therapy  [facility] for tasks assessed/performed       Past Medical History:  Diagnosis Date  . Allergy   . Arthritis   . Cataract   . Diabetes mellitus without complication ([place])   . Hypertension   . Macular degeneration syndrome    with edema---being treated.     Past Surgical History:  Procedure Laterality Date  . callous removal  Left 2017   located on 1st digit on L foot 2/2 diabetes  . EYE SURGERY      There were no vitals filed for this visit.  Subjective Assessment - 03/03/19 0724    Subjective  Went to see physiatrist yesterday who feels that her head tilt is more postural and does not feel it is due to skew deviation.   Is still going to go see the neuro-ophthalmologist.  Would like to work on using the [facility] more independently with OT.  Feels more ROM in her neck, was able to turn to the L with better balance.    Pertinent History  Patient had a R CVA 02/2018 and was walking with a SPC prior to a fall in March 2020 resulting in being hospitalized with multiple pelvic fxs and a L hip fx. During hospital stay, patient acquired pressure ulcers on bilateral soles of feet near heels.Patient is now safely able to bear weight and pressure  ulcers are now a stage 1 (per daughter report and photos).    Patient Stated Goals  "Be back to normal, get back to walking, and getting strength back"    Currently in Pain?  No/denies                       [facility] Adult PT Treatment/Exercise - 03/03/19 0725      Bed Mobility   Bed Mobility  Sit to Sidelying Left    Left Sidelying to Sit  Minimal Assistance - Patient >75%    Sit to Sidelying Left  Minimal Assistance - Patient > 75%      Transfers   Transfers  Sit to Stand;Stand to Sit;Stand Pivot Transfers    Sit to Stand  5: Supervision    Stand to Sit  5: Supervision    Stand Pivot Transfers  4: Min assist    Stand Pivot Transfer Details (indicate cue type and reason)  to keep head elevated and in midline      Neuro Re-ed    Neuro Re-ed Details   Upper trap stretch and L side activation first in L sidelying with head on pillow focusing on concurrent R shoulder depression and isometric L neck lateral  flexion into pilllow x 6 seconds x 5 reps.  Transtioned to supported side sitting on L with trunk supported on bolster and L elbow positioned under L shoulder and head tilted to L: placed laser on forehead and had pt perform L lateral flexion with rotation to L and R tracing edge of mirror and then performing diagonal head movements from top L > bottom R corner x 10 and top R > bottom L x 10.  Added to bottom L > upper R corner R shoulder horizontal ABD and then holding RUE up and reaching up towards the ceiling by pushing L elbow into mat.  Returned to sitting with feet supported and seated on lateral rockerboard.  Keeping head in midline with visual cue to keep laser on target performed lateral weight shifting to R x 8 reps and then L x 5 reps reaching each UE up and to the R or L focusing on trunk elongation and contralateral trunk shortening.  Greater difficulty with R trunk excursion with L trunk shortening.  When shifting to L focus on maintaining head in midline and controlling  L weight shift and maintaining without tipping to L.                 PT Short Term Goals - 02/18/19 0917      PT SHORT TERM GOAL #1   Title  Pt will demonstrate ability to perform ongoing HEP (stretching and standing exercises) with supervision    Time  4    Period  Weeks    Status  Revised    Target Date  03/20/19      PT SHORT TERM GOAL #2   Title  Patient will increase BERG score to at least a 30/56 in order to help decrease risk of future falls.    Baseline  26/30    Time  4    Period  Weeks    Status  Revised    Target Date  03/20/19      PT SHORT TERM GOAL #3   Title  Pt will perform stand pivot transfers with rollator x 5 reps with distant supervision    Time  4    Period  Weeks    Status  Revised    Target Date  03/20/19      PT SHORT TERM GOAL #4   Title  Pt will ambulate x 115' with LRAD on level, indoor surfaces with distant supervision    Baseline  min guard x 200' with rollator    Time  4    Period  Weeks    Status  Revised    Target Date  03/20/19      PT SHORT TERM GOAL #5   Title  Pt will improve gait velocity with LRAD to >/= 1.0 ft/sec    Baseline  .71    Time  4    Period  Weeks    Status  Revised    Target Date  03/20/19      PT SHORT TERM GOAL #6   Title  Patient will negotiate 4 stairs with 2 rails, step to sequence with supervision    Baseline  Min A    Time  4    Period  Weeks    Status  Revised    Target Date  03/20/19        PT Long Term Goals - 02/18/19 0827      PT LONG TERM GOAL #1   Title  Patient will report performing HEP atleast 5 days a week with husband's supervision    Baseline  daughter provides supervision/min A    Time  8    Period  Weeks    Status  Revised    Target Date  04/19/19      PT LONG TERM GOAL #2   Title  Pt will ambulate 90' with LRAD over indoor surfaces with supervision and will report ambulating short distances at home with LRAD and husband's supervision    Baseline  --    Time  8     Period  Weeks    Status  Revised    Target Date  04/19/19      PT LONG TERM GOAL #3   Title  Patient will increase BERG score to at least 34/56 in order to decrease risk of future falls.    Baseline  --    Time  8    Period  Weeks    Status  Revised    Target Date  04/19/19      PT LONG TERM GOAL #4   Title  Pt will perform sit <> stand and stand pivots with LRAD MOD I    Baseline  --    Time  8    Period  Weeks    Status  Revised    Target Date  04/19/19      PT LONG TERM GOAL #5   Title  Patient will increase gait speed to at least 1.8t/sec with LRAD in order to decrease risk of falls and improve household ambulation.    Baseline  --    Time  8    Period  Weeks    Status  Revised    Target Date  04/19/19      PT LONG TERM GOAL #6   Title  Pt will ambulate x 100' outside over pavement with LRAD and supervision-min A    Baseline  --    Time  8    Period  Weeks    Status  Revised    Target Date  04/19/19            Plan - 03/03/19 0817    Clinical Impression Statement  Continued use of head laser in variety of positions: L side sitting and sitting on rockerboard to continue to focus on NMR for head righting and postural re-education and increased activation for L side shortening and weight shifting to R.  Pt tolerated well and is demonstrating improved ROM and ability to maintain head in midline with sitting balance.  Will continue to progress towards walking.    Rehab Potential  Good    PT Frequency  3x / week    PT Duration  8 weeks    PT Treatment/Interventions  ADLs/Self Care Home Management;Gait training;DME Instruction;Stair training;Functional mobility training;Therapeutic activities;Patient/family education;Neuromuscular re-education;Balance training;Therapeutic exercise;Passive range of motion;Orthotic Fit/Training    PT Next Visit Plan  Working on reorientation to midline with head laser - seated on rockerboard weight shifting keeping head in middle and then  while performing sit <> stand.  Incorporate LUE use into session.  Standing balance with decreased UE support; work towards pt performing HEP and ambulation short distances with husband's supervision at home when daughter not present.  Work on extending gait for endurance and mobility, L weight shifting in standing and forward weight shifting-rockerboard.  Step ups and step downs to address stairs.    PT Home Exercise Plan  [id number]  Consulted and Agree with Plan of Care  Patient;Family member/caregiver    Family Member Consulted  Daughter, [name]       Patient will benefit from skilled therapeutic intervention in order to improve the following deficits and impairments:  Abnormal gait, Decreased activity tolerance, Decreased balance, Decreased range of motion, Decreased mobility, Decreased endurance, Decreased skin integrity, Decreased strength, Difficulty walking, Impaired perceived functional ability, Postural dysfunction  Visit Diagnosis: Hemiplegia and hemiparesis following cerebral infarction affecting left non-dominant side (HCC)  Muscle weakness (generalized)  Unsteadiness on feet  Other abnormalities of gait and mobility     Problem List Patient Active Problem List   Diagnosis Date Noted  . Iron deficiency anemia 02/03/2019  . Pelvic fracture ([place]) 09/21/2018  . Allergic reaction caused by a drug 05/06/2018  . Bilateral leg edema 04/13/2018  . HLD (hyperlipidemia) 03/23/2018  . Mood swings 03/23/2018  . Chronic kidney disease (CKD), stage III (moderate) (HCC)   . Embolic stroke ([place]) [id number]  . Left hemiparesis ([place])   . Diabetes mellitus type 2 in nonobese (HCC)   . Acute ischemic stroke ([place])   . Left arm weakness   . Acute CVA (cerebrovascular accident) ([place]) 02/24/2018  . Overweight (BMI 25.0-29.9) 02/24/2018  . CKD (chronic kidney disease), stage III ([place]) 02/24/2018  . Diabetes mellitus without complication ([place]) 0000000  . HTN (hypertension) 09/02/2017     Rico Junker, PT, DPT 03/03/19    8:22 AM    [facility] [address] [place], [place], [address] Phone: [phone]   Fax:  [phone]  Name: [name] MRN: [id number] Date of Birth: 1945/10/29

## 2019-03-04 ENCOUNTER — Ambulatory Visit: Payer: Medicare Other | Admitting: Physical Therapy

## 2019-03-04 ENCOUNTER — Encounter: Payer: Self-pay | Admitting: Physical Therapy

## 2019-03-04 ENCOUNTER — Ambulatory Visit: Payer: Medicare Other | Admitting: Occupational Therapy

## 2019-03-04 DIAGNOSIS — R278 Other lack of coordination: Secondary | ICD-10-CM

## 2019-03-04 DIAGNOSIS — M6281 Muscle weakness (generalized): Secondary | ICD-10-CM

## 2019-03-04 DIAGNOSIS — R2689 Other abnormalities of gait and mobility: Secondary | ICD-10-CM

## 2019-03-04 DIAGNOSIS — I69354 Hemiplegia and hemiparesis following cerebral infarction affecting left non-dominant side: Secondary | ICD-10-CM | POA: Diagnosis not present

## 2019-03-04 DIAGNOSIS — R2681 Unsteadiness on feet: Secondary | ICD-10-CM

## 2019-03-04 DIAGNOSIS — M25612 Stiffness of left shoulder, not elsewhere classified: Secondary | ICD-10-CM

## 2019-03-04 NOTE — Therapy (Signed)
[facility] [address] [place], [place], [address] Phone: [phone]   Fax:  [phone]  Occupational Therapy Treatment  Patient Details  Name: [name] MRN: [id number] Date of Birth: March 01, 1946 Referring Provider (OT): Dr. Letta Pate   Encounter Date: 03/04/2019  OT End of Session - 03/04/19 1205    Visit Number  18    Number of Visits  29    Date for OT Re-Evaluation  04/20/19    Authorization Type  [employer] MCR    Authorization Time Period  cert. period 02/19/19-05/20/19    Authorization - Visit Number  18    Authorization - Number of Visits  20    Activity Tolerance  Patient tolerated treatment well    Behavior During Therapy  [facility] for tasks assessed/performed       Past Medical History:  Diagnosis Date  . Allergy   . Arthritis   . Cataract   . Diabetes mellitus without complication ([place])   . Hypertension   . Macular degeneration syndrome    with edema---being treated.     Past Surgical History:  Procedure Laterality Date  . callous removal  Left 2017   located on 1st digit on L foot 2/2 diabetes  . EYE SURGERY      There were no vitals filed for this visit.  Subjective Assessment - 03/04/19 0850    Patient is accompanied by:  Family member   daughter   Pertinent History  CVA 02/27/18, pelvic fx and Lt hip  fx 09/2018. PMH: HTN, OA    Limitations  Fall risk, wounds on feet    Currently in Pain?  No/denies    Pain Onset  More than a month ago              Treatment: Sitting, closed chain shoulder flexand abduction with BUEs with ball with min facilitation and cueing for alignment and incr scapular movement.  With bilateral UE's supported on ball pt performed scapular retraction/ protraction. Standing  Low range reaching for yellow and red clothespins,  Min facilitation and v.c for posture and to avoid compensation, mod difficulty Seated flipping large playing cards, with LUE for  increased functional use min v.c  set-up for wt. Shift to the L.                 OT Short Term Goals - 03/03/19 1313      OT SHORT TERM GOAL #1   Title  Independent with initial HEP for shoulder ROM, and functional reaching LUE    Time  4    Period  Weeks    Status  Achieved      OT SHORT TERM GOAL #2   Title  Pt to perform UB dressing with set up only and LE dressing with mod assist or less using A/E prn    Time  4    Period  Weeks    Status  Achieved      OT SHORT TERM GOAL #3   Title  Pt to increase LUE functional use as evidenced by performing 20 blocks on Box & Blocks test.--STGs due 03/21/19    Baseline  11 blocks    Time  4    Period  Weeks    Status  On-going   01/27/19:  11 blocks     OT SHORT TERM GOAL #4   Title  Pt to perform toileting/clothes management with only min assist    Period  Weeks  Status  Achieved   Able to complete - simulation -  in clinic.  01/27/19:  met per pt/dtr report     OT SHORT TERM GOAL #5   Title  Pt to achieve 90* shoulder flexion LUE for mid level reaching    Time  4    Period  Weeks    Status  On-going   01/18/19:  75* with min compensation.  02/17/19:  85* with min compensation       OT Long Term Goals - 02/19/19 1005      OT LONG TERM GOAL #1   Title  Independent with updated HEP - due 04/20/19    Time  8    Period  Weeks    Status  On-going      OT LONG TERM GOAL #2   Title  Pt to be mod I with all BADLS using A/E as needed (except shower transfers to still be supervision)    Time  8    Period  Weeks    Status  On-going   02/17/19:  assist for compression stockings, min A for depends, supervision for shower transfer (mod I for bathing), toileting with BSC mod I     OT LONG TERM GOAL #3   Title  Pt to improve LUE function as evidenced by performing 25 blocks or greater on Box & Blocks test    Time  8    Period  Weeks    Status  On-going   02/17/19:  13     OT LONG TERM GOAL #4   Title  Pt to improve  coordination as evidenced by performing 9 hole peg test completely in 90 sec or less    Baseline  placed 5 in 2 min.    Time  8    Period  Weeks    Status  On-going   02/17/19:  5 pegs within 83mn.  completed in 445m 43sec     OT LONG TERM GOAL #5   Title  Grip strength Lt hand to be 15 lbs or greater    Baseline  8 lbs    Time  8    Period  Weeks    Status  On-going   02/17/19:  5lbs     OT LONG TERM GOAL #6   Title  Pt to perform simple snack, cold food, and microwaveable food mod I level with DME prn without LOB    Time  8    Period  Weeks    Status  On-going   02/17/19:  not performing     OT LONG TERM GOAL #7   Title  Pt to achieve 110* or greater P/ROM Lt shoulder with pain less than or equal to 5/10    Time  8    Period  Weeks    Status  Achieved   02/17/19:  110-115* in sitting with 4-5/10 pain           Plan - 03/04/19 1202    Clinical Impression Statement  Pt is progressing towards goals for RUE functional use.    OT Occupational Profile and History  Detailed Assessment- Review of Records and additional review of physical, cognitive, psychosocial history related to current functional performance    Occupational performance deficits (Please refer to evaluation for details):  ADL's;IADL's;Leisure    Body Structure / Function / Physical Skills  ADL;ROM;Balance;IADL;Body mechanics;Improper spinal/pelvic alignment;Mobility;Strength;Flexibility;FMC;Coordination;UE functional use;Proprioception;Decreased knowledge of use of DME    Rehab Potential  Good  Clinical Decision Making  Several treatment options, min-mod task modification necessary    Comorbidities Affecting Occupational Performance:  Presence of comorbidities impacting occupational performance    Comorbidities impacting occupational performance description:  Wounds bilateral feet, Healed recent hip and pelvic fx's    Modification or Assistance to Complete Evaluation   Min-Moderate modification of tasks or  assist with assess necessary to complete eval    OT Frequency  2x / week    OT Duration  8 weeks    OT Treatment/Interventions  Self-care/ADL training;Therapeutic exercise;Functional Mobility Training;Neuromuscular education;Aquatic Therapy;Manual Therapy;Splinting;Therapeutic activities;Paraffin;DME and/or AE instruction;Cognitive remediation/compensation;Electrical Stimulation;Visual/perceptual remediation/compensation;Moist Heat;Passive range of motion;Patient/family education    Plan  LUE function, trunk control    Consulted and Agree with Plan of Care  Patient;Family member/caregiver    Family Member Consulted  Daughter Unk Lightning)       Patient will benefit from skilled therapeutic intervention in order to improve the following deficits and impairments:   Body Structure / Function / Physical Skills: ADL, ROM, Balance, IADL, Body mechanics, Improper spinal/pelvic alignment, Mobility, Strength, Flexibility, FMC, Coordination, UE functional use, Proprioception, Decreased knowledge of use of DME       Visit Diagnosis: Hemiplegia and hemiparesis following cerebral infarction affecting left non-dominant side (HCC)  Muscle weakness (generalized)  Stiffness of left shoulder, not elsewhere classified  Other lack of coordination  Unsteadiness on feet    Problem List Patient Active Problem List   Diagnosis Date Noted  . Iron deficiency anemia 02/03/2019  . Pelvic fracture ([place]) 09/21/2018  . Allergic reaction caused by a drug 05/06/2018  . Bilateral leg edema 04/13/2018  . HLD (hyperlipidemia) 03/23/2018  . Mood swings 03/23/2018  . Chronic kidney disease (CKD), stage III (moderate) (HCC)   . Embolic stroke ([place]) 08/18/1550  . Left hemiparesis ([place])   . Diabetes mellitus type 2 in nonobese (HCC)   . Acute ischemic stroke ([place])   . Left arm weakness   . Acute CVA (cerebrovascular accident) ([place]) 02/24/2018  . Overweight (BMI 25.0-29.9) 02/24/2018  . CKD (chronic kidney disease),  stage III ([place]) 02/24/2018  . Diabetes mellitus without complication ([place]) 02/07/2335  . HTN (hypertension) 09/02/2017    , 03/04/2019, 12:09 PM  [facility] [address] [place], [place], [address] Phone: [phone]   Fax:  [phone]  Name: WILLEEN NOVAK MRN: [id number] Date of Birth: 1945/08/25

## 2019-03-05 ENCOUNTER — Encounter: Payer: Self-pay | Admitting: Occupational Therapy

## 2019-03-05 ENCOUNTER — Encounter: Payer: Self-pay | Admitting: Physical Therapy

## 2019-03-05 ENCOUNTER — Ambulatory Visit: Payer: Medicare Other | Admitting: Occupational Therapy

## 2019-03-05 ENCOUNTER — Other Ambulatory Visit: Payer: Self-pay

## 2019-03-05 ENCOUNTER — Ambulatory Visit: Payer: Medicare Other | Admitting: Physical Therapy

## 2019-03-05 DIAGNOSIS — I69354 Hemiplegia and hemiparesis following cerebral infarction affecting left non-dominant side: Secondary | ICD-10-CM | POA: Diagnosis not present

## 2019-03-05 DIAGNOSIS — M6281 Muscle weakness (generalized): Secondary | ICD-10-CM

## 2019-03-05 DIAGNOSIS — R2681 Unsteadiness on feet: Secondary | ICD-10-CM

## 2019-03-05 DIAGNOSIS — R2689 Other abnormalities of gait and mobility: Secondary | ICD-10-CM

## 2019-03-05 DIAGNOSIS — M25612 Stiffness of left shoulder, not elsewhere classified: Secondary | ICD-10-CM

## 2019-03-05 DIAGNOSIS — R278 Other lack of coordination: Secondary | ICD-10-CM

## 2019-03-05 DIAGNOSIS — M25512 Pain in left shoulder: Secondary | ICD-10-CM

## 2019-03-05 DIAGNOSIS — G8929 Other chronic pain: Secondary | ICD-10-CM

## 2019-03-05 NOTE — Therapy (Signed)
[facility] [address] [place], [place], [address] Phone: [phone]   Fax:  [phone]  Occupational Therapy Treatment  Patient Details  Name: [name] MRN: [id number] Date of Birth: 1945-08-07 Referring Provider (OT): Dr. Letta Pate   Encounter Date: 03/05/2019  OT End of Session - 03/05/19 0840    Visit Number  19    Number of Visits  29    Date for OT Re-Evaluation  04/20/19    Authorization Type  [employer] MCR    Authorization Time Period  cert. period 02/19/19-05/20/19    Authorization - Visit Number  71    Authorization - Number of Visits  20    OT Start Time  0720    OT Stop Time  0800    OT Time Calculation (min)  40 min    Activity Tolerance  Patient tolerated treatment well    Behavior During Therapy  [facility] for tasks assessed/performed       Past Medical History:  Diagnosis Date  . Allergy   . Arthritis   . Cataract   . Diabetes mellitus without complication ([place])   . Hypertension   . Macular degeneration syndrome    with edema---being treated.     Past Surgical History:  Procedure Laterality Date  . callous removal  Left 2017   located on 1st digit on L foot 2/2 diabetes  . EYE SURGERY      There were no vitals filed for this visit.  Subjective Assessment - 03/05/19 0839    Subjective   nothing new    Patient is accompanied by:  Family member   daughter   Pertinent History  CVA 02/27/18, pelvic fx and Lt hip  fx 09/2018. PMH: HTN, OA    Limitations  Fall risk, wounds on feet    Currently in Pain?  No/denies    Pain Onset  More than a month ago         Sitting, closed chain shoulder flex and abduction with BUEs with ball with min facilitation and cueing for alignment and incr scapular movement.  Sitting, wt. Bearing through L hand with body on arm movements for incr wt. Shift, shoulder stability, and tricep activation with min facilitation/cueing.    Supine, closed-chain shoulder  flex/chest press with PVC frame mod facilitation/cues for normal movement patterns  Sidelying, AAROM shoulder flex/ext with min facilitation for incr scapular facilitation.    In sitting, fastening/unfastening large buttons with min-mod cueing to use LUE as assist with mod difficulty.  Doffing/donning sweater with min-mod cueing to incr use of LUE for task and pull shirt down with LUE instead of reaching across.             OT Short Term Goals - 03/03/19 1313      OT SHORT TERM GOAL #1   Title  Independent with initial HEP for shoulder ROM, and functional reaching LUE    Time  4    Period  Weeks    Status  Achieved      OT SHORT TERM GOAL #2   Title  Pt to perform UB dressing with set up only and LE dressing with mod assist or less using A/E prn    Time  4    Period  Weeks    Status  Achieved      OT SHORT TERM GOAL #3   Title  Pt to increase LUE functional use as evidenced by performing 20 blocks on Box &  Blocks test.--STGs due 03/21/19    Baseline  11 blocks    Time  4    Period  Weeks    Status  On-going   01/27/19:  11 blocks     OT SHORT TERM GOAL #4   Title  Pt to perform toileting/clothes management with only min assist    Period  Weeks    Status  Achieved   Able to complete - simulation -  in clinic.  01/27/19:  met per pt/dtr report     OT SHORT TERM GOAL #5   Title  Pt to achieve 90* shoulder flexion LUE for mid level reaching    Time  4    Period  Weeks    Status  On-going   01/18/19:  75* with min compensation.  02/17/19:  85* with min compensation       OT Long Term Goals - 02/19/19 1005      OT LONG TERM GOAL #1   Title  Independent with updated HEP - due 04/20/19    Time  8    Period  Weeks    Status  On-going      OT LONG TERM GOAL #2   Title  Pt to be mod I with all BADLS using A/E as needed (except shower transfers to still be supervision)    Time  8    Period  Weeks    Status  On-going   02/17/19:  assist for compression stockings,  min A for depends, supervision for shower transfer (mod I for bathing), toileting with BSC mod I     OT LONG TERM GOAL #3   Title  Pt to improve LUE function as evidenced by performing 25 blocks or greater on Box & Blocks test    Time  8    Period  Weeks    Status  On-going   02/17/19:  13     OT LONG TERM GOAL #4   Title  Pt to improve coordination as evidenced by performing 9 hole peg test completely in 90 sec or less    Baseline  placed 5 in 2 min.    Time  8    Period  Weeks    Status  On-going   02/17/19:  5 pegs within 30mn.  completed in 434m 43sec     OT LONG TERM GOAL #5   Title  Grip strength Lt hand to be 15 lbs or greater    Baseline  8 lbs    Time  8    Period  Weeks    Status  On-going   02/17/19:  5lbs     OT LONG TERM GOAL #6   Title  Pt to perform simple snack, cold food, and microwaveable food mod I level with DME prn without LOB    Time  8    Period  Weeks    Status  On-going   02/17/19:  not performing     OT LONG TERM GOAL #7   Title  Pt to achieve 110* or greater P/ROM Lt shoulder with pain less than or equal to 5/10    Time  8    Period  Weeks    Status  Achieved   02/17/19:  110-115* in sitting with 4-5/10 pain           Plan - 03/05/19 0840    Clinical Impression Statement  Pt is progressing towards goals with improving RUE functional use and posture, but demo  learned nonuse and needs prompts/cueing to incr use of LUE for bilateral tasks such as buttoning and donning/doffing sweater..    OT Occupational Profile and History  Detailed Assessment- Review of Records and additional review of physical, cognitive, psychosocial history related to current functional performance    Occupational performance deficits (Please refer to evaluation for details):  ADL's;IADL's;Leisure    Body Structure / Function / Physical Skills  ADL;ROM;Balance;IADL;Body mechanics;Improper spinal/pelvic alignment;Mobility;Strength;Flexibility;FMC;Coordination;UE functional  use;Proprioception;Decreased knowledge of use of DME    Rehab Potential  Good    Clinical Decision Making  Several treatment options, min-mod task modification necessary    Comorbidities Affecting Occupational Performance:  Presence of comorbidities impacting occupational performance    Comorbidities impacting occupational performance description:  Wounds bilateral feet, Healed recent hip and pelvic fx's    Modification or Assistance to Complete Evaluation   Min-Moderate modification of tasks or assist with assess necessary to complete eval    OT Frequency  2x / week    OT Duration  8 weeks    OT Treatment/Interventions  Self-care/ADL training;Therapeutic exercise;Functional Mobility Training;Neuromuscular education;Aquatic Therapy;Manual Therapy;Splinting;Therapeutic activities;Paraffin;DME and/or AE instruction;Cognitive remediation/compensation;Electrical Stimulation;Visual/perceptual remediation/compensation;Moist Heat;Passive range of motion;Patient/family education    Plan  LUE function, trunk control;  Progress note next session    Consulted and Agree with Plan of Care  Patient;Family member/caregiver    Family Member Consulted  Daughter Unk Lightning)       Patient will benefit from skilled therapeutic intervention in order to improve the following deficits and impairments:   Body Structure / Function / Physical Skills: ADL, ROM, Balance, IADL, Body mechanics, Improper spinal/pelvic alignment, Mobility, Strength, Flexibility, FMC, Coordination, UE functional use, Proprioception, Decreased knowledge of use of DME       Visit Diagnosis: Hemiplegia and hemiparesis following cerebral infarction affecting left non-dominant side (HCC)  Muscle weakness (generalized)  Stiffness of left shoulder, not elsewhere classified  Other lack of coordination  Unsteadiness on feet  Other abnormalities of gait and mobility  Chronic left shoulder pain    Problem List Patient Active Problem List    Diagnosis Date Noted  . Iron deficiency anemia 02/03/2019  . Pelvic fracture ([place]) 09/21/2018  . Allergic reaction caused by a drug 05/06/2018  . Bilateral leg edema 04/13/2018  . HLD (hyperlipidemia) 03/23/2018  . Mood swings 03/23/2018  . Chronic kidney disease (CKD), stage III (moderate) (HCC)   . Embolic stroke ([place]) 66/12/3014  . Left hemiparesis ([place])   . Diabetes mellitus type 2 in nonobese (HCC)   . Acute ischemic stroke ([place])   . Left arm weakness   . Acute CVA (cerebrovascular accident) ([place]) 02/24/2018  . Overweight (BMI 25.0-29.9) 02/24/2018  . CKD (chronic kidney disease), stage III ([place]) 02/24/2018  . Diabetes mellitus without complication ([place]) 07/16/3233  . HTN (hypertension) 09/02/2017    [facility] 03/05/2019, 8:42 AM  [facility] [address] [place], [place], [address] Phone: [phone]   Fax:  [phone]  Name: [name] MRN: [id number] Date of Birth: [birth year]   Vianne Bulls, OTR/L [facility] [address] [place], [place]  [address] [phone] phone [phone] 03/05/19 8:43 AM

## 2019-03-05 NOTE — Therapy (Signed)
[facility] [address] [place], [place], [address] Phone: [phone]   Fax:  [phone]  Physical Therapy Treatment  Patient Details  Name: [name] MRN: [id number] Date of Birth: [birth year] Referring Provider (PT): Alysia Penna, MD   Encounter Date: 03/04/2019  PT End of Session - 03/04/19 0804    Visit Number  27    Number of Visits  40    Date for PT Re-Evaluation  04/19/19    Authorization Type  UHC Medicare, 10th visit progress note    PT Start Time  0802    PT Stop Time  0845    PT Time Calculation (min)  43 min    Equipment Utilized During Treatment  Other (comment)   head laser   Activity Tolerance  Patient tolerated treatment well    Behavior During Therapy  [facility] for tasks assessed/performed       Past Medical History:  Diagnosis Date  . Allergy   . Arthritis   . Cataract   . Diabetes mellitus without complication ([place])   . Hypertension   . Macular degeneration syndrome    with edema---being treated.     Past Surgical History:  Procedure Laterality Date  . callous removal  Left 2017   located on 1st digit on L foot 2/2 diabetes  . EYE SURGERY      There were no vitals filed for this visit.  Subjective Assessment - 03/04/19 0803    Subjective  No new complaints. No pain or falls to report.    Patient is accompained by:  Family member    Pertinent History  Patient had a R CVA 02/2018 and was walking with a SPC prior to a fall in March 2020 resulting in being hospitalized with multiple pelvic fxs and a L hip fx. During hospital stay, patient acquired pressure ulcers on bilateral soles of feet near heels.Patient is now safely able to bear weight and pressure ulcers are now a stage 1 (per daughter report and photos).    Patient Stated Goals  "Be back to normal, get back to walking, and getting strength back"    Currently in Pain?  No/denies    Pain Score  0-No pain               [facility] Adult PT Treatment/Exercise - 03/04/19 0805      Transfers   Transfers  Sit to Stand;Stand to Sit;Stand Pivot Transfers    Sit to Stand  4: Min guard;4: Min assist;With upper extremity assist;From bed    Sit to Stand Details  Verbal cues for sequencing;Manual facilitation for weight shifting    Sit to Stand Details (indicate cue type and reason)  use of laser to keep cervical midline using card with A as target. cues for equal LE weight bearing and increased anterior weight shifting.     Stand to Sit  4: Min guard;With upper extremity assist;To bed    Stand to Sit Details (indicate cue type and reason)  Verbal cues for sequencing;Manual facilitation for weight shifting;Manual facilitation for weight bearing    Stand to Sit Details  use of laser to maintain cervical neutral with sitting, cues/facilitation for equal LE weight bearing and slow, controlled descent.     Stand Pivot Transfers  4: Min assist    Stand Pivot Transfer Details (indicate cue type and reason)  from wheelchair to mat table with cues on posture and step placement    Comments  worked  on sit<>stand transfers in bloc practice. worked on posture ("shoulder back, tuck the butt") with each stand.       Ambulation/Gait   Ambulation/Gait  Yes      Neuro Re-ed    Neuro Re-ed Details   for balance/strengthening/proprioception: seated on rocker board with feet on 4 inch box worked on lateral rocking with emphasis on contralateral trunk elongataion with PTA facilitation through board.; progressed to right UE reaching up with pelvic depression on that side, into left UE reaching as high as range allowed with pelvic depression for increased trunk elongation., with tall posture had pt perform 2 sets of 10 scapular retractions with emphasis to keep scapular depression as well. had pt with laser on for visual to keep cervical midline with all activites.            PT Short Term Goals - 02/18/19 0917      PT  SHORT TERM GOAL #1   Title  Pt will demonstrate ability to perform ongoing HEP (stretching and standing exercises) with supervision    Time  4    Period  Weeks    Status  Revised    Target Date  03/20/19      PT SHORT TERM GOAL #2   Title  Patient will increase BERG score to at least a 30/56 in order to help decrease risk of future falls.    Baseline  26/30    Time  4    Period  Weeks    Status  Revised    Target Date  03/20/19      PT SHORT TERM GOAL #3   Title  Pt will perform stand pivot transfers with rollator x 5 reps with distant supervision    Time  4    Period  Weeks    Status  Revised    Target Date  03/20/19      PT SHORT TERM GOAL #4   Title  Pt will ambulate x 115' with LRAD on level, indoor surfaces with distant supervision    Baseline  min guard x 200' with rollator    Time  4    Period  Weeks    Status  Revised    Target Date  03/20/19      PT SHORT TERM GOAL #5   Title  Pt will improve gait velocity with LRAD to >/= 1.0 ft/sec    Baseline  .71    Time  4    Period  Weeks    Status  Revised    Target Date  03/20/19      PT SHORT TERM GOAL #6   Title  Patient will negotiate 4 stairs with 2 rails, step to sequence with supervision    Baseline  Min A    Time  4    Period  Weeks    Status  Revised    Target Date  03/20/19        PT Long Term Goals - 02/18/19 0827      PT LONG TERM GOAL #1   Title  Patient will report performing HEP atleast 5 days a week with husband's supervision    Baseline  daughter provides supervision/min A    Time  8    Period  Weeks    Status  Revised    Target Date  04/19/19      PT LONG TERM GOAL #2   Title  Pt will ambulate 230' with LRAD over indoor surfaces with  supervision and will report ambulating short distances at home with LRAD and husband's supervision    Baseline  --    Time  8    Period  Weeks    Status  Revised    Target Date  04/19/19      PT LONG TERM GOAL #3   Title  Patient will increase BERG  score to at least 34/56 in order to decrease risk of future falls.    Baseline  --    Time  8    Period  Weeks    Status  Revised    Target Date  04/19/19      PT LONG TERM GOAL #4   Title  Pt will perform sit <> stand and stand pivots with LRAD MOD I    Baseline  --    Time  8    Period  Weeks    Status  Revised    Target Date  04/19/19      PT LONG TERM GOAL #5   Title  Patient will increase gait speed to at least 1.8t/sec with LRAD in order to decrease risk of falls and improve household ambulation.    Baseline  --    Time  8    Period  Weeks    Status  Revised    Target Date  04/19/19      PT LONG TERM GOAL #6   Title  Pt will ambulate x 100' outside over pavement with LRAD and supervision-min A    Baseline  --    Time  8    Period  Weeks    Status  Revised    Target Date  04/19/19            Plan - 03/04/19 0804    Clinical Impression Statement  Today's skilled session continued with use of laser to find midline cervical positon with sitting progressing to sit<>stand transfers. Continue with use of rockerboard under pt in sitting to work on weight shifting, trunk elongation and posture. Cues needed with rest breaks to maintain posture as well. In standing worked on weight shifting and posture with each rep. The pt is progressing toward goals and should benefit from continued PT to progress toward unmet goals.    Rehab Potential  Good    PT Frequency  3x / week    PT Duration  8 weeks    PT Treatment/Interventions  ADLs/Self Care Home Management;Gait training;DME Instruction;Stair training;Functional mobility training;Therapeutic activities;Patient/family education;Neuromuscular re-education;Balance training;Therapeutic exercise;Passive range of motion;Orthotic Fit/Training    PT Next Visit Plan  Working on reorientation to midline with head laser - seated on rockerboard weight shifting keeping head in middle and then while performing sit <> stand.  Incorporate LUE use  into session.  Standing balance with decreased UE support; work towards pt performing HEP and ambulation short distances with husband's supervision at home when daughter not present.  Work on extending gait for endurance and mobility, L weight shifting in standing and forward weight shifting-rockerboard.  Step ups and step downs to address stairs.    PT Home Exercise Plan  [id number]    Consulted and Agree with Plan of Care  Patient;Family member/caregiver    Family Member Consulted  Daughter, [name]       Patient will benefit from skilled therapeutic intervention in order to improve the following deficits and impairments:  Abnormal gait, Decreased activity tolerance, Decreased balance, Decreased range of motion, Decreased mobility, Decreased endurance, Decreased skin integrity,  Decreased strength, Difficulty walking, Impaired perceived functional ability, Postural dysfunction  Visit Diagnosis: Hemiplegia and hemiparesis following cerebral infarction affecting left non-dominant side (HCC)  Muscle weakness (generalized)  Unsteadiness on feet  Other abnormalities of gait and mobility     Problem List Patient Active Problem List   Diagnosis Date Noted  . Iron deficiency anemia 02/03/2019  . Pelvic fracture ([place]) 09/21/2018  . Allergic reaction caused by a drug 05/06/2018  . Bilateral leg edema 04/13/2018  . HLD (hyperlipidemia) 03/23/2018  . Mood swings 03/23/2018  . Chronic kidney disease (CKD), stage III (moderate) (HCC)   . Embolic stroke ([place]) [id number]  . Left hemiparesis ([place])   . Diabetes mellitus type 2 in nonobese (HCC)   . Acute ischemic stroke ([place])   . Left arm weakness   . Acute CVA (cerebrovascular accident) ([place]) 02/24/2018  . Overweight (BMI 25.0-29.9) 02/24/2018  . CKD (chronic kidney disease), stage III ([place]) 02/24/2018  . Diabetes mellitus without complication ([place]) 0000000  . HTN (hypertension) 09/02/2017    Willow Ora, PTA, [facility] [address], [place], [place] [address] [phone] 03/05/19, 7:58 AM   Name: [name] MRN: [id number] Date of Birth: 02-May-1946

## 2019-03-05 NOTE — Therapy (Signed)
[name] [address] [place], [place], [address] Phone: [phone]   Fax:  [phone]  Physical Therapy Treatment  Patient Details  Name: [name] MRN: [id number] Date of Birth: October 17, 1945 Referring Provider (PT): Alysia Penna, MD   Encounter Date: 03/05/2019  PT End of Session - 03/05/19 0957    Visit Number  28    Number of Visits  40    Date for PT Re-Evaluation  04/19/19    Authorization Type  [employer] Medicare, 10th visit progress note    PT Start Time  0800    PT Stop Time  0845    PT Time Calculation (min)  45 min    Equipment Utilized During Treatment  Other (comment)   head laser   Activity Tolerance  Patient tolerated treatment well    Behavior During Therapy  [facility] for tasks assessed/performed       Past Medical History:  Diagnosis Date  . Allergy   . Arthritis   . Cataract   . Diabetes mellitus without complication ([place])   . Hypertension   . Macular degeneration syndrome    with edema---being treated.     Past Surgical History:  Procedure Laterality Date  . callous removal  Left 2017   located on 1st digit on L foot 2/2 diabetes  . EYE SURGERY      There were no vitals filed for this visit.  Subjective Assessment - 03/05/19 0802    Subjective  Just finished with OT.  Neck was sore but it feels much better now.    Patient is accompained by:  Family member    Pertinent History  Patient had a R CVA 02/2018 and was walking with a SPC prior to a fall in March 2020 resulting in being hospitalized with multiple pelvic fxs and a L hip fx. During hospital stay, patient acquired pressure ulcers on bilateral soles of feet near heels.Patient is now safely able to bear weight and pressure ulcers are now a stage 1 (per daughter report and photos).    Patient Stated Goals  "Be back to normal, get back to walking, and getting strength back"    Currently in Pain?  No/denies                        [facility] Adult PT Treatment/Exercise - 03/05/19 [id number]      Neuro Re-ed    Neuro Re-ed Details   With laser on head for visual feedback of head position and keeping laser on target while performing sit <> stand from tall sitting on bolster x 2 sets x 4 reps with therapist providing verbal cues for anterior weight shift over feet and extension through LE to stand and to keep head and trunk upright during stand > sit and utilizing hip and knee flexion to shift hips backwards.          Balance Exercises - 03/05/19 0835      Balance Exercises: Standing   Standing Eyes Opened  Narrow base of support (BOS);Wide (BOA);Solid surface;4 reps;10 secs;Time   10 > 60 sec; laser on target in front of pt   Other Standing Exercises  Also with laser on head focusing laser on target in front of patient with feet apart, performed lateral weight shifting L <> middle <> R first with UE support and therapist manually facilitating and then progressing to no UE support and therapist only providing light min guard.  PT Education - 03/05/19 [phone]    Education Details  educated pt and daughter that after daughter travels internationally, daughter would need to bring pt in and then wait in the car for a couple of weeks and may need to monitor exposure of patient when she returns.    Person(s) Educated  Patient;Child(ren)    Methods  Explanation    Comprehension  Verbalized understanding       PT Short Term Goals - 02/18/19 0917      PT SHORT TERM GOAL #1   Title  Pt will demonstrate ability to perform ongoing HEP (stretching and standing exercises) with supervision    Time  4    Period  Weeks    Status  Revised    Target Date  03/20/19      PT SHORT TERM GOAL #2   Title  Patient will increase BERG score to at least a 30/56 in order to help decrease risk of future falls.    Baseline  26/30    Time  4    Period  Weeks    Status  Revised    Target Date  03/20/19       PT SHORT TERM GOAL #3   Title  Pt will perform stand pivot transfers with rollator x 5 reps with distant supervision    Time  4    Period  Weeks    Status  Revised    Target Date  03/20/19      PT SHORT TERM GOAL #4   Title  Pt will ambulate x 115' with LRAD on level, indoor surfaces with distant supervision    Baseline  min guard x 200' with rollator    Time  4    Period  Weeks    Status  Revised    Target Date  03/20/19      PT SHORT TERM GOAL #5   Title  Pt will improve gait velocity with LRAD to >/= 1.0 ft/sec    Baseline  .71    Time  4    Period  Weeks    Status  Revised    Target Date  03/20/19      PT SHORT TERM GOAL #6   Title  Patient will negotiate 4 stairs with 2 rails, step to sequence with supervision    Baseline  Min A    Time  4    Period  Weeks    Status  Revised    Target Date  03/20/19        PT Long Term Goals - 02/18/19 0827      PT LONG TERM GOAL #1   Title  Patient will report performing HEP atleast 5 days a week with husband's supervision    Baseline  daughter provides supervision/min A    Time  8    Period  Weeks    Status  Revised    Target Date  04/19/19      PT LONG TERM GOAL #2   Title  Pt will ambulate 230' with LRAD over indoor surfaces with supervision and will report ambulating short distances at home with LRAD and husband's supervision    Baseline  --    Time  8    Period  Weeks    Status  Revised    Target Date  04/19/19      PT LONG TERM GOAL #3   Title  Patient will increase BERG score to at least 34/56 in  order to decrease risk of future falls.    Baseline  --    Time  8    Period  Weeks    Status  Revised    Target Date  04/19/19      PT LONG TERM GOAL #4   Title  Pt will perform sit <> stand and stand pivots with LRAD MOD I    Baseline  --    Time  8    Period  Weeks    Status  Revised    Target Date  04/19/19      PT LONG TERM GOAL #5   Title  Patient will increase gait speed to at least 1.8t/sec with  LRAD in order to decrease risk of falls and improve household ambulation.    Baseline  --    Time  8    Period  Weeks    Status  Revised    Target Date  04/19/19      PT LONG TERM GOAL #6   Title  Pt will ambulate x 100' outside over pavement with LRAD and supervision-min A    Baseline  --    Time  8    Period  Weeks    Status  Revised    Target Date  04/19/19            Plan - 03/05/19 [id number]    Clinical Impression Statement  Continued to focus on use of laser for visual feedback on head righting and posture while performing repeated sit <.> stands, static standing balance and weight shifting with and without UE support.  Pt demonstrated increased fatigue today and required increased tactile and verbal cues from therapist to maintain head position and upright posture.  As pt fatigues she returns to flexed posture and posterior leaning.  Next week will be final week of 3x/week and then will decrease to 2x/week.  Pt and daughter verbalized agreement.    Rehab Potential  Good    PT Frequency  3x / week    PT Duration  8 weeks    PT Treatment/Interventions  ADLs/Self Care Home Management;Gait training;DME Instruction;Stair training;Functional mobility training;Therapeutic activities;Patient/family education;Neuromuscular re-education;Balance training;Therapeutic exercise;Passive range of motion;Orthotic Fit/Training    PT Next Visit Plan  Working on reorientation to midline with head laser - seated on rockerboard weight shifting keeping head in middle and then while performing sit <> stand, standing balance and ambulation.  Incorporate LUE use into session.  Standing balance with decreased UE support; work towards pt performing HEP and ambulation short distances with husband's supervision at home when daughter not present.  Work on extending gait for endurance and mobility, L weight shifting in standing and forward weight shifting-rockerboard.  Step ups and step downs to address stairs.    PT  Home Exercise Plan  [id number]    Consulted and Agree with Plan of Care  Patient;Family member/caregiver    Family Member Consulted  Daughter, [name]       Patient will benefit from skilled therapeutic intervention in order to improve the following deficits and impairments:  Abnormal gait, Decreased activity tolerance, Decreased balance, Decreased range of motion, Decreased mobility, Decreased endurance, Decreased skin integrity, Decreased strength, Difficulty walking, Impaired perceived functional ability, Postural dysfunction  Visit Diagnosis: Hemiplegia and hemiparesis following cerebral infarction affecting left non-dominant side (HCC)  Muscle weakness (generalized)  Unsteadiness on feet  Other abnormalities of gait and mobility     Problem List Patient Active Problem List   Diagnosis Date  Noted  . Iron deficiency anemia 02/03/2019  . Pelvic fracture ([place]) 09/21/2018  . Allergic reaction caused by a drug 05/06/2018  . Bilateral leg edema 04/13/2018  . HLD (hyperlipidemia) 03/23/2018  . Mood swings 03/23/2018  . Chronic kidney disease (CKD), stage III (moderate) (HCC)   . Embolic stroke ([place]) [id number]  . Left hemiparesis ([place])   . Diabetes mellitus type 2 in nonobese (HCC)   . Acute ischemic stroke ([place])   . Left arm weakness   . Acute CVA (cerebrovascular accident) ([place]) 02/24/2018  . Overweight (BMI 25.0-29.9) 02/24/2018  . CKD (chronic kidney disease), stage III ([place]) 02/24/2018  . Diabetes mellitus without complication ([place]) 0000000  . HTN (hypertension) 09/02/2017    Rico Junker, PT, DPT 03/05/19    10:01 AM    [facility] [address] [place], [place], [address] Phone: [phone]   Fax:  [phone]  Name: [name] MRN: [id number] Date of Birth: [birth year]

## 2019-03-08 ENCOUNTER — Ambulatory Visit: Payer: Medicare Other | Admitting: Occupational Therapy

## 2019-03-08 ENCOUNTER — Ambulatory Visit: Payer: Medicare Other | Admitting: Rehabilitative and Restorative Service Providers"

## 2019-03-08 ENCOUNTER — Other Ambulatory Visit: Payer: Self-pay

## 2019-03-08 ENCOUNTER — Encounter: Payer: Self-pay | Admitting: Rehabilitative and Restorative Service Providers"

## 2019-03-08 DIAGNOSIS — M6281 Muscle weakness (generalized): Secondary | ICD-10-CM

## 2019-03-08 DIAGNOSIS — R2689 Other abnormalities of gait and mobility: Secondary | ICD-10-CM

## 2019-03-08 DIAGNOSIS — I69354 Hemiplegia and hemiparesis following cerebral infarction affecting left non-dominant side: Secondary | ICD-10-CM

## 2019-03-08 DIAGNOSIS — R2681 Unsteadiness on feet: Secondary | ICD-10-CM

## 2019-03-08 NOTE — Therapy (Signed)
[facility] [address] [place], [place], [address] Phone: [phone]   Fax:  [phone]  Physical Therapy Treatment  Patient Details  Name: [name] MRN: [id number] Date of Birth: 04/01/1946 Referring Provider (PT): Alysia Penna, MD   Encounter Date: 03/08/2019  PT End of Session - 03/08/19 0940    Visit Number  29    Number of Visits  40    Date for PT Re-Evaluation  04/19/19    Authorization Type  [employer] Medicare, 10th visit progress note    PT Start Time  0850    PT Stop Time  0933    PT Time Calculation (min)  43 min    Activity Tolerance  Patient tolerated treatment well    Behavior During Therapy  [facility] for tasks assessed/performed       Past Medical History:  Diagnosis Date  . Allergy   . Arthritis   . Cataract   . Diabetes mellitus without complication ([place])   . Hypertension   . Macular degeneration syndrome    with edema---being treated.     Past Surgical History:  Procedure Laterality Date  . callous removal  Left 2017   located on 1st digit on L foot 2/2 diabetes  . EYE SURGERY      There were no vitals filed for this visit.  Subjective Assessment - 03/08/19 0851    Subjective  The patient is walking up and down the steps with assist from her daughter.  She has not been walking regularly at home due to fear of falling and needing assist.    Patient is accompained by:  Family member    Pertinent History  Patient had a R CVA 02/2018 and was walking with a SPC prior to a fall in March 2020 resulting in being hospitalized with multiple pelvic fxs and a L hip fx. During hospital stay, patient acquired pressure ulcers on bilateral soles of feet near heels.Patient is now safely able to bear weight and pressure ulcers are now a stage 1 (per daughter report and photos).    Patient Stated Goals  "Be back to normal, get back to walking, and getting strength back"    Currently in Pain?  No/denies                        [facility] Adult PT Treatment/Exercise - 03/08/19 0942      Transfers   Transfers  Sit to Stand;Stand to Sit    Sit to Stand  5: Supervision    Sit to Stand Details (indicate cue type and reason)  Demonstration cues for upright seated  position, anterior pelvic tilt, transition to standing without use of device (there but not touching) x 3 reps, 2 reps.  Incorporated cues for head positioning as well.    Stand to Sit  5: Supervision    Stand to Sit Details  Worked on posture during transitional movements.      Ambulation/Gait   Ambulation/Gait  Yes    Ambulation/Gait Assistance  5: Supervision;4: Min guard    Ambulation/Gait Assistance Details  Patient ambulated with rollator RW x 115 feet as warm up for today's session.  She needed close supervision with occasional CGA when left foot did not clear well; she needs cues for upright position through trunk and head.    Discussed fear of falling limiting home mobility and patient reports she feels safet with standard RW.  Walked 115 feet with  RW and close Sup to CGA and then 50 feet.    Assistive device  Rollator;Rolling walker    Gait Pattern  Step-through pattern;Decreased step length - left;Decreased dorsiflexion - left;Decreased step length - right;Left foot flat;Right foot flat    Ambulation Surface  Level;Indoor      Neuro Re-ed    Neuro Re-ed Details   Standing without using UEs on RW with CGA working on upright posture, equal weight shift, standing rotation (R arm reaches to L walker handle and reverse), standing in stride position working on R UE overhead reaching for trunk elongation.  Standing with UE support through RW and stepping up to 2" surface x 5 reps each leg for weight shift, posture re-ed, loading LEs.      Exercises   Exercises  Other Exercises    Other Exercises   Seated trunk lateral elongation hiking R hip, reaching R and L.  Seated trunk rotation with PROM and overpressure for postural  stretch.             PT Education - 03/08/19 [phone]    Education Details  continued to emphasize need for more activity in the home; patient needs to engage muscles more frequently, stand more, and walk with family assist in order to demo improvement    Person(s) Educated  Patient;Child(ren)    Methods  Explanation    Comprehension  Verbalized understanding       PT Short Term Goals - 02/18/19 0917      PT SHORT TERM GOAL #1   Title  Pt will demonstrate ability to perform ongoing HEP (stretching and standing exercises) with supervision    Time  4    Period  Weeks    Status  Revised    Target Date  03/20/19      PT SHORT TERM GOAL #2   Title  Patient will increase BERG score to at least a 30/56 in order to help decrease risk of future falls.    Baseline  26/30    Time  4    Period  Weeks    Status  Revised    Target Date  03/20/19      PT SHORT TERM GOAL #3   Title  Pt will perform stand pivot transfers with rollator x 5 reps with distant supervision    Time  4    Period  Weeks    Status  Revised    Target Date  03/20/19      PT SHORT TERM GOAL #4   Title  Pt will ambulate x 115' with LRAD on level, indoor surfaces with distant supervision    Baseline  min guard x 200' with rollator    Time  4    Period  Weeks    Status  Revised    Target Date  03/20/19      PT SHORT TERM GOAL #5   Title  Pt will improve gait velocity with LRAD to >/= 1.0 ft/sec    Baseline  .71    Time  4    Period  Weeks    Status  Revised    Target Date  03/20/19      PT SHORT TERM GOAL #6   Title  Patient will negotiate 4 stairs with 2 rails, step to sequence with supervision    Baseline  Min A    Time  4    Period  Weeks    Status  Revised    Target  Date  03/20/19        PT Long Term Goals - 02/18/19 0827      PT LONG TERM GOAL #1   Title  Patient will report performing HEP atleast 5 days a week with husband's supervision    Baseline  daughter provides supervision/min A     Time  8    Period  Weeks    Status  Revised    Target Date  04/19/19      PT LONG TERM GOAL #2   Title  Pt will ambulate 230' with LRAD over indoor surfaces with supervision and will report ambulating short distances at home with LRAD and husband's supervision    Baseline  --    Time  8    Period  Weeks    Status  Revised    Target Date  04/19/19      PT LONG TERM GOAL #3   Title  Patient will increase BERG score to at least 34/56 in order to decrease risk of future falls.    Baseline  --    Time  8    Period  Weeks    Status  Revised    Target Date  04/19/19      PT LONG TERM GOAL #4   Title  Pt will perform sit <> stand and stand pivots with LRAD MOD I    Baseline  --    Time  8    Period  Weeks    Status  Revised    Target Date  04/19/19      PT LONG TERM GOAL #5   Title  Patient will increase gait speed to at least 1.8t/sec with LRAD in order to decrease risk of falls and improve household ambulation.    Baseline  --    Time  8    Period  Weeks    Status  Revised    Target Date  04/19/19      PT LONG TERM GOAL #6   Title  Pt will ambulate x 100' outside over pavement with LRAD and supervision-min A    Baseline  --    Time  8    Period  Weeks    Status  Revised    Target Date  04/19/19            Plan - 03/08/19 0941    Clinical Impression Statement  PT emphasized increasing activity in the home.  We used walking  115 ft as warm up and discussed home walking.  [place]et is fearful of falling and feels safest with RW over rollator.  PT worked on including postural lengthening, head position awareness in session with balance and mobility for increased intensity of activity.    PT Treatment/Interventions  ADLs/Self Care Home Management;Gait training;DME Instruction;Stair training;Functional mobility training;Therapeutic activities;Patient/family education;Neuromuscular re-education;Balance training;Therapeutic exercise;Passive range of motion;Orthotic Fit/Training     PT Next Visit Plan  Working on reorientation to midline with head laser - seated on rockerboard weight shifting keeping head in middle and then while performing sit <> stand, standing balance and ambulation.  Incorporate LUE use into session.  Standing balance with decreased UE support; work towards pt performing HEP and ambulation short distances with husband's supervision at home when daughter not present.  Work on extending gait for endurance and mobility, L weight shifting in standing and forward weight shifting-rockerboard.  Step ups and step downs to address stairs.    PT Home Exercise Plan  [id number]  Family Member Consulted  Daughter, [name]       Patient will benefit from skilled therapeutic intervention in order to improve the following deficits and impairments:  Abnormal gait, Decreased activity tolerance, Decreased balance, Decreased range of motion, Decreased mobility, Decreased endurance, Decreased skin integrity, Decreased strength, Difficulty walking, Impaired perceived functional ability, Postural dysfunction  Visit Diagnosis: Muscle weakness (generalized)  Unsteadiness on feet  Other abnormalities of gait and mobility  Hemiplegia and hemiparesis following cerebral infarction affecting left non-dominant side [facility])     Problem List Patient Active Problem List   Diagnosis Date Noted  . Iron deficiency anemia 02/03/2019  . Pelvic fracture ([place]) 09/21/2018  . Allergic reaction caused by a drug 05/06/2018  . Bilateral leg edema 04/13/2018  . HLD (hyperlipidemia) 03/23/2018  . Mood swings 03/23/2018  . Chronic kidney disease (CKD), stage III (moderate) (HCC)   . Embolic stroke ([place]) 99991111  . Left hemiparesis ([place])   . Diabetes mellitus type 2 in nonobese (HCC)   . Acute ischemic stroke ([place])   . Left arm weakness   . Acute CVA (cerebrovascular accident) ([place]) 02/24/2018  . Overweight (BMI 25.0-29.9) 02/24/2018  . CKD (chronic kidney disease), stage III ([place])  02/24/2018  . Diabetes mellitus without complication ([place]) 0000000  . HTN (hypertension) 09/02/2017    Jadamarie Butson,PT 03/08/2019, 12:05 PM  [facility] [address] [place], [place], [address] Phone: [phone]   Fax:  [phone]  Name: DATHA LUDD MRN: [id number] Date of Birth: 03/10/1946

## 2019-03-09 ENCOUNTER — Encounter: Payer: Self-pay | Admitting: Physical Therapy

## 2019-03-09 ENCOUNTER — Ambulatory Visit: Payer: Medicare Other | Admitting: Physical Therapy

## 2019-03-09 ENCOUNTER — Encounter: Payer: Self-pay | Admitting: Occupational Therapy

## 2019-03-09 ENCOUNTER — Encounter: Payer: Medicare Other | Admitting: Occupational Therapy

## 2019-03-09 ENCOUNTER — Ambulatory Visit: Payer: Medicare Other | Attending: Physical Medicine & Rehabilitation | Admitting: Occupational Therapy

## 2019-03-09 DIAGNOSIS — R278 Other lack of coordination: Secondary | ICD-10-CM | POA: Insufficient documentation

## 2019-03-09 DIAGNOSIS — G8929 Other chronic pain: Secondary | ICD-10-CM | POA: Diagnosis present

## 2019-03-09 DIAGNOSIS — I69354 Hemiplegia and hemiparesis following cerebral infarction affecting left non-dominant side: Secondary | ICD-10-CM | POA: Diagnosis present

## 2019-03-09 DIAGNOSIS — M6281 Muscle weakness (generalized): Secondary | ICD-10-CM | POA: Insufficient documentation

## 2019-03-09 DIAGNOSIS — M25612 Stiffness of left shoulder, not elsewhere classified: Secondary | ICD-10-CM | POA: Insufficient documentation

## 2019-03-09 DIAGNOSIS — I6349 Cerebral infarction due to embolism of other cerebral artery: Secondary | ICD-10-CM | POA: Diagnosis not present

## 2019-03-09 DIAGNOSIS — R2681 Unsteadiness on feet: Secondary | ICD-10-CM | POA: Insufficient documentation

## 2019-03-09 DIAGNOSIS — M25512 Pain in left shoulder: Secondary | ICD-10-CM | POA: Diagnosis present

## 2019-03-09 DIAGNOSIS — R2689 Other abnormalities of gait and mobility: Secondary | ICD-10-CM | POA: Insufficient documentation

## 2019-03-09 NOTE — Therapy (Signed)
[facility] [address] [place], [place], [address] Phone: [phone]   Fax:  [phone]  Patient Details  Name: [name] MRN: [id number] Date of Birth: 1946-01-01 Referring Provider:  Charlett Blake, MD  Encounter Date: 03/09/2019   After finishing OT appointment, the pt reported that she was not feeling well and has been feeling nauseous since this morning and was not up to participating in PT. Pt was not seen by PT today - pt has another PT appointment tomorrow.    Arliss Journey, PT, DPT 03/09/2019, 1:45 PM  [facility] [address] [place], [place], [address] Phone: [phone]   Fax:  [phone]

## 2019-03-09 NOTE — Therapy (Signed)
[facility] [address] [place], [place], [address] Phone: [phone]   Fax:  [phone]  Occupational Therapy Treatment  Patient Details  Name: [name] MRN: [id number] Date of Birth: 27-Mar-1946 Referring Provider (OT): Dr. Letta Pate   Encounter Date: 03/09/2019  OT End of Session - 03/09/19 1503    Visit Number  20    Number of Visits  29    Date for OT Re-Evaluation  04/20/19    Authorization Type  [employer] MCR    Authorization Time Period  cert. period 02/19/19-05/20/19    Authorization - Visit Number  18    Authorization - Number of Visits  20    OT Start Time  1022    OT Stop Time  1100    OT Time Calculation (min)  38 min    Activity Tolerance  Patient tolerated treatment well    Behavior During Therapy  WFL for tasks assessed/performed       Past Medical History:  Diagnosis Date  . Allergy   . Arthritis   . Cataract   . Diabetes mellitus without complication ([place])   . Hypertension   . Macular degeneration syndrome    with edema---being treated.     Past Surgical History:  Procedure Laterality Date  . callous removal  Left 2017   located on 1st digit on L foot 2/2 diabetes  . EYE SURGERY      There were no vitals filed for this visit.  Subjective Assessment - 03/09/19 1501    Subjective   dtr reports that pt's stomach is bothering her a little today.  Pt reports that she doesn't feel like she can do much walking    Patient is accompanied by:  Family member   daughter   Pertinent History  CVA 02/27/18, pelvic fx and Lt hip  fx 09/2018. PMH: HTN, OA    Limitations  Fall risk, wounds on feet    Currently in Pain?  No/denies    Pain Onset  More than a month ago       Transfer w/c>mat with min-mod A today.  Dtr reports new w/c cushion.   Sitting, closed chain shoulder flex and abduction with BUEs with ball with min facilitation and cueing for alignment and incr scapular  movement.  Sitting, placing large pegs in pegboard for incr coordination and hand strength with cueing/focus on 3point pinch with mod difficulty.  Sitting, placing checkers in connect 4 slots incorporating mid-range reach, coordination/3point pinch, and wt. Shift to the L with min difficulty.       OT Short Term Goals - 03/09/19 1508      OT SHORT TERM GOAL #1   Title  Independent with initial HEP for shoulder ROM, and functional reaching LUE    Time  4    Period  Weeks    Status  Achieved      OT SHORT TERM GOAL #2   Title  Pt to perform UB dressing with set up only and LE dressing with mod assist or less using A/E prn    Time  4    Period  Weeks    Status  Achieved      OT SHORT TERM GOAL #3   Title  Pt to increase LUE functional use as evidenced by performing 20 blocks on Box & Blocks test.--STGs due 03/21/19    Baseline  11 blocks    Time  4    Period  Weeks  Status  On-going   01/27/19:  11 blocks, 02/17/19:  13     OT SHORT TERM GOAL #4   Title  Pt to perform toileting/clothes management with only min assist    Period  Weeks    Status  Achieved   Able to complete - simulation -  in clinic.  01/27/19:  met per pt/dtr report     OT SHORT TERM GOAL #5   Title  Pt to achieve 90* shoulder flexion LUE for mid level reaching    Time  4    Period  Weeks    Status  On-going   01/18/19:  75* with min compensation.  02/17/19:  85* with min compensation.  03/09/19: inconsistent       OT Long Term Goals - 02/19/19 1005      OT LONG TERM GOAL #1   Title  Independent with updated HEP - due 04/20/19    Time  8    Period  Weeks    Status  On-going      OT LONG TERM GOAL #2   Title  Pt to be mod I with all BADLS using A/E as needed (except shower transfers to still be supervision)    Time  8    Period  Weeks    Status  On-going   02/17/19:  assist for compression stockings, min A for depends, supervision for shower transfer (mod I for bathing), toileting with BSC mod I      OT LONG TERM GOAL #3   Title  Pt to improve LUE function as evidenced by performing 25 blocks or greater on Box & Blocks test    Time  8    Period  Weeks    Status  On-going   02/17/19:  13     OT LONG TERM GOAL #4   Title  Pt to improve coordination as evidenced by performing 9 hole peg test completely in 90 sec or less    Baseline  placed 5 in 2 min.    Time  8    Period  Weeks    Status  On-going   02/17/19:  5 pegs within 37mn.  completed in 468m 43sec     OT LONG TERM GOAL #5   Title  Grip strength Lt hand to be 15 lbs or greater    Baseline  8 lbs    Time  8    Period  Weeks    Status  On-going   02/17/19:  5lbs     OT LONG TERM GOAL #6   Title  Pt to perform simple snack, cold food, and microwaveable food mod I level with DME prn without LOB    Time  8    Period  Weeks    Status  On-going   02/17/19:  not performing     OT LONG TERM GOAL #7   Title  Pt to achieve 110* or greater P/ROM Lt shoulder with pain less than or equal to 5/10    Time  8    Period  Weeks    Status  Achieved   02/17/19:  110-115* in sitting with 4-5/10 pain           Plan - 03/09/19 1504    Clinical Impression Statement  Progress Note Reporting Period 02/09/19-03/09/19.  Pt is slowly progressing towards goals with improving posture, LUE functional use, and activity tolerance.  However, pt would benefit from additional occupational therapy for improved LUE functional  use, coordination, and strength.  Learned nonuse appears to be a barrier.  Pt not feeling as well today and needed incr assist for transfers.    OT Occupational Profile and History  Detailed Assessment- Review of Records and additional review of physical, cognitive, psychosocial history related to current functional performance    Occupational performance deficits (Please refer to evaluation for details):  ADL's;IADL's;Leisure    Body Structure / Function / Physical Skills  ADL;ROM;Balance;IADL;Body mechanics;Improper spinal/pelvic  alignment;Mobility;Strength;Flexibility;FMC;Coordination;UE functional use;Proprioception;Decreased knowledge of use of DME    Rehab Potential  Good    Clinical Decision Making  Several treatment options, min-mod task modification necessary    Comorbidities Affecting Occupational Performance:  Presence of comorbidities impacting occupational performance    Comorbidities impacting occupational performance description:  Wounds bilateral feet, Healed recent hip and pelvic fx's    Modification or Assistance to Complete Evaluation   Min-Moderate modification of tasks or assist with assess necessary to complete eval    OT Frequency  2x / week    OT Duration  8 weeks    OT Treatment/Interventions  Self-care/ADL training;Therapeutic exercise;Functional Mobility Training;Neuromuscular education;Aquatic Therapy;Manual Therapy;Splinting;Therapeutic activities;Paraffin;DME and/or AE instruction;Cognitive remediation/compensation;Electrical Stimulation;Visual/perceptual remediation/compensation;Moist Heat;Passive range of motion;Patient/family education    Plan  LUE functional use, neuro re-ed, trunk control    Consulted and Agree with Plan of Care  Patient;Family member/caregiver    Family Member Consulted  Daughter Unk Lightning)       Patient will benefit from skilled therapeutic intervention in order to improve the following deficits and impairments:   Body Structure / Function / Physical Skills: ADL, ROM, Balance, IADL, Body mechanics, Improper spinal/pelvic alignment, Mobility, Strength, Flexibility, FMC, Coordination, UE functional use, Proprioception, Decreased knowledge of use of DME       Visit Diagnosis: Hemiplegia and hemiparesis following cerebral infarction affecting left non-dominant side (HCC)  Muscle weakness (generalized)  Unsteadiness on feet  Other lack of coordination  Stiffness of left shoulder, not elsewhere classified  Chronic left shoulder pain  Other abnormalities of gait and  mobility    Problem List Patient Active Problem List   Diagnosis Date Noted  . Iron deficiency anemia 02/03/2019  . Pelvic fracture ([place]) 09/21/2018  . Allergic reaction caused by a drug 05/06/2018  . Bilateral leg edema 04/13/2018  . HLD (hyperlipidemia) 03/23/2018  . Mood swings 03/23/2018  . Chronic kidney disease (CKD), stage III (moderate) (HCC)   . Embolic stroke ([place]) 74/25/9563  . Left hemiparesis ([place])   . Diabetes mellitus type 2 in nonobese (HCC)   . Acute ischemic stroke ([place])   . Left arm weakness   . Acute CVA (cerebrovascular accident) ([place]) 02/24/2018  . Overweight (BMI 25.0-29.9) 02/24/2018  . CKD (chronic kidney disease), stage III ([place]) 02/24/2018  . Diabetes mellitus without complication ([place]) 87/56/4332  . HTN (hypertension) 09/02/2017    [facility] 03/09/2019, 3:09 PM  [facility] [address] [place], [place], [address] Phone: [phone]   Fax:  [phone]  Name: BEATA BEASON MRN: [id number] Date of Birth: 1945/07/17   Vianne Bulls, OTR/L [facility] [address] [name], [place]  [address] [phone] phone [phone] 03/09/19 3:10 PM

## 2019-03-10 ENCOUNTER — Ambulatory Visit: Payer: Medicare Other | Admitting: Physical Therapy

## 2019-03-10 ENCOUNTER — Encounter: Payer: Self-pay | Admitting: Occupational Therapy

## 2019-03-10 ENCOUNTER — Ambulatory Visit: Payer: Medicare Other | Admitting: Occupational Therapy

## 2019-03-10 ENCOUNTER — Other Ambulatory Visit: Payer: Self-pay

## 2019-03-10 ENCOUNTER — Encounter: Payer: Self-pay | Admitting: Physical Therapy

## 2019-03-10 DIAGNOSIS — I69354 Hemiplegia and hemiparesis following cerebral infarction affecting left non-dominant side: Secondary | ICD-10-CM

## 2019-03-10 DIAGNOSIS — R2681 Unsteadiness on feet: Secondary | ICD-10-CM

## 2019-03-10 DIAGNOSIS — M25512 Pain in left shoulder: Secondary | ICD-10-CM

## 2019-03-10 DIAGNOSIS — R2689 Other abnormalities of gait and mobility: Secondary | ICD-10-CM

## 2019-03-10 DIAGNOSIS — M25612 Stiffness of left shoulder, not elsewhere classified: Secondary | ICD-10-CM

## 2019-03-10 DIAGNOSIS — G8929 Other chronic pain: Secondary | ICD-10-CM

## 2019-03-10 DIAGNOSIS — M6281 Muscle weakness (generalized): Secondary | ICD-10-CM

## 2019-03-10 DIAGNOSIS — R278 Other lack of coordination: Secondary | ICD-10-CM

## 2019-03-10 NOTE — Therapy (Signed)
[facility] [address] [place], [place], [address] Phone: [phone]   Fax:  [phone]  Occupational Therapy Treatment  Patient Details  Name: [name] MRN: [id number] Date of Birth: 09-08-1945 Referring Provider (OT): Dr. Letta Pate   Encounter Date: 03/10/2019  OT End of Session - 03/10/19 0820    Visit Number  21    Number of Visits  29    Date for OT Re-Evaluation  04/20/19    Authorization Type  [employer] MCR    Authorization Time Period  cert. period 02/19/19-05/20/19    Authorization - Visit Number  21    Authorization - Number of Visits  20    OT Start Time  0805    OT Stop Time  0845    OT Time Calculation (min)  40 min    Activity Tolerance  Patient tolerated treatment well    Behavior During Therapy  [facility] for tasks assessed/performed       Past Medical History:  Diagnosis Date  . Allergy   . Arthritis   . Cataract   . Diabetes mellitus without complication ([place])   . Hypertension   . Macular degeneration syndrome    with edema---being treated.     Past Surgical History:  Procedure Laterality Date  . callous removal  Left 2017   located on 1st digit on L foot 2/2 diabetes  . EYE SURGERY      There were no vitals filed for this visit.  Subjective Assessment - 03/10/19 0816    Subjective   Pt reports that she feels better, thinks it was related to diverticulitis    Patient is accompanied by:  Family member   daughter   Pertinent History  CVA 02/27/18, pelvic fx and Lt hip  fx 09/2018. PMH: HTN, OA    Limitations  Fall risk, wounds on feet    Currently in Pain?  No/denies    Pain Onset  More than a month ago        Placing/removing clothespins with 1-4lb resistance on edge of box for incr strength with min difficulty with coordination.  Placing/removing various-sized pegs in arc with min-mod difficulty for in-hand manipulation.   Incr difficulty with small pegs (used shelf liner under  pegs for easier grasp)  Pouring water from one cup to the other with good accuracy using L hand  Picking up checkers to get 2 in hand with min-mod A/cues to hold in palm and translate from palm to fingers.       OT Short Term Goals - 03/09/19 1508      OT SHORT TERM GOAL #1   Title  Independent with initial HEP for shoulder ROM, and functional reaching LUE    Time  4    Period  Weeks    Status  Achieved      OT SHORT TERM GOAL #2   Title  Pt to perform UB dressing with set up only and LE dressing with mod assist or less using A/E prn    Time  4    Period  Weeks    Status  Achieved      OT SHORT TERM GOAL #3   Title  Pt to increase LUE functional use as evidenced by performing 20 blocks on Box & Blocks test.--STGs due 03/21/19    Baseline  11 blocks    Time  4    Period  Weeks    Status  On-going   01/27/19:  11 blocks, 02/17/19:  13     OT SHORT TERM GOAL #4   Title  Pt to perform toileting/clothes management with only min assist    Period  Weeks    Status  Achieved   Able to complete - simulation -  in clinic.  01/27/19:  met per pt/dtr report     OT SHORT TERM GOAL #5   Title  Pt to achieve 90* shoulder flexion LUE for mid level reaching    Time  4    Period  Weeks    Status  On-going   01/18/19:  75* with min compensation.  02/17/19:  85* with min compensation.  03/09/19: inconsistent       OT Long Term Goals - 02/19/19 1005      OT LONG TERM GOAL #1   Title  Independent with updated HEP - due 04/20/19    Time  8    Period  Weeks    Status  On-going      OT LONG TERM GOAL #2   Title  Pt to be mod I with all BADLS using A/E as needed (except shower transfers to still be supervision)    Time  8    Period  Weeks    Status  On-going   02/17/19:  assist for compression stockings, min A for depends, supervision for shower transfer (mod I for bathing), toileting with BSC mod I     OT LONG TERM GOAL #3   Title  Pt to improve LUE function as evidenced by performing  25 blocks or greater on Box & Blocks test    Time  8    Period  Weeks    Status  On-going   02/17/19:  13     OT LONG TERM GOAL #4   Title  Pt to improve coordination as evidenced by performing 9 hole peg test completely in 90 sec or less    Baseline  placed 5 in 2 min.    Time  8    Period  Weeks    Status  On-going   02/17/19:  5 pegs within 40mn.  completed in 457m 43sec     OT LONG TERM GOAL #5   Title  Grip strength Lt hand to be 15 lbs or greater    Baseline  8 lbs    Time  8    Period  Weeks    Status  On-going   02/17/19:  5lbs     OT LONG TERM GOAL #6   Title  Pt to perform simple snack, cold food, and microwaveable food mod I level with DME prn without LOB    Time  8    Period  Weeks    Status  On-going   02/17/19:  not performing     OT LONG TERM GOAL #7   Title  Pt to achieve 110* or greater P/ROM Lt shoulder with pain less than or equal to 5/10    Time  8    Period  Weeks    Status  Achieved   02/17/19:  110-115* in sitting with 4-5/10 pain           Plan - 03/10/19 083664  Clinical Impression Statement  Pt demo improved LUE coordination today.  Encouraged incr LUE functional use.    OT Occupational Profile and History  Detailed Assessment- Review of Records and additional review of physical, cognitive, psychosocial history related to current functional performance  Occupational performance deficits (Please refer to evaluation for details):  ADL's;IADL's;Leisure    Body Structure / Function / Physical Skills  ADL;ROM;Balance;IADL;Body mechanics;Improper spinal/pelvic alignment;Mobility;Strength;Flexibility;FMC;Coordination;UE functional use;Proprioception;Decreased knowledge of use of DME    Rehab Potential  Good    Clinical Decision Making  Several treatment options, min-mod task modification necessary    Comorbidities Affecting Occupational Performance:  Presence of comorbidities impacting occupational performance    Comorbidities impacting occupational  performance description:  Wounds bilateral feet, Healed recent hip and pelvic fx's    Modification or Assistance to Complete Evaluation   Min-Moderate modification of tasks or assist with assess necessary to complete eval    OT Frequency  2x / week    OT Duration  8 weeks    OT Treatment/Interventions  Self-care/ADL training;Therapeutic exercise;Functional Mobility Training;Neuromuscular education;Aquatic Therapy;Manual Therapy;Splinting;Therapeutic activities;Paraffin;DME and/or AE instruction;Cognitive remediation/compensation;Electrical Stimulation;Visual/perceptual remediation/compensation;Moist Heat;Passive range of motion;Patient/family education    Plan  LUE functional use, neuro re-ed, trunk control    Consulted and Agree with Plan of Care  Patient;Family member/caregiver    Family Member Consulted  Daughter Unk Lightning)       Patient will benefit from skilled therapeutic intervention in order to improve the following deficits and impairments:   Body Structure / Function / Physical Skills: ADL, ROM, Balance, IADL, Body mechanics, Improper spinal/pelvic alignment, Mobility, Strength, Flexibility, FMC, Coordination, UE functional use, Proprioception, Decreased knowledge of use of DME       Visit Diagnosis: Hemiplegia and hemiparesis following cerebral infarction affecting left non-dominant side (HCC)  Muscle weakness (generalized)  Unsteadiness on feet  Other lack of coordination  Stiffness of left shoulder, not elsewhere classified  Chronic left shoulder pain    Problem List Patient Active Problem List   Diagnosis Date Noted  . Iron deficiency anemia 02/03/2019  . Pelvic fracture ([place]) 09/21/2018  . Allergic reaction caused by a drug 05/06/2018  . Bilateral leg edema 04/13/2018  . HLD (hyperlipidemia) 03/23/2018  . Mood swings 03/23/2018  . Chronic kidney disease (CKD), stage III (moderate) (HCC)   . Embolic stroke ([place]) 54/98/2641  . Left hemiparesis ([place])   . Diabetes  mellitus type 2 in nonobese (HCC)   . Acute ischemic stroke ([place])   . Left arm weakness   . Acute CVA (cerebrovascular accident) ([place]) 02/24/2018  . Overweight (BMI 25.0-29.9) 02/24/2018  . CKD (chronic kidney disease), stage III ([place]) 02/24/2018  . Diabetes mellitus without complication ([place]) 58/30/9407  . HTN (hypertension) 09/02/2017    [facility] 03/10/2019, 8:31 AM  [facility] [address] [place], [place], [address] Phone: [phone]   Fax:  [phone]  Name: [name] MRN: [id number] Date of Birth: 1945/10/31   Vianne Bulls, OTR/L [facility] [address] [place], [place]  [address] [phone] phone [phone] 03/10/19 8:31 AM

## 2019-03-10 NOTE — Therapy (Addendum)
[facility] [address] [place], [place], [address] Phone: [phone]   Fax:  [phone]  Physical Therapy Treatment and 10th visit PN  Patient Details  Name: [name] MRN: [id number] Date of Birth: 1946-01-14 Referring Provider (PT): Alysia Penna, MD   Encounter Date: 03/10/2019   Progress Note Reporting Period 02/11/2019 to 03/10/2019  See note below for Objective Data and Assessment of Progress/Goals.     PT End of Session - 03/10/19 0939    Visit Number  30    Number of Visits  40    Date for PT Re-Evaluation  04/19/19    Authorization Type  [employer] Medicare, 10th visit progress note    PT Start Time  0717    PT Stop Time  0804    PT Time Calculation (min)  47 min    Activity Tolerance  Patient tolerated treatment well    Behavior During Therapy  [facility] for tasks assessed/performed       Past Medical History:  Diagnosis Date  . Allergy   . Arthritis   . Cataract   . Diabetes mellitus without complication ([place])   . Hypertension   . Macular degeneration syndrome    with edema---being treated.     Past Surgical History:  Procedure Laterality Date  . callous removal  Left 2017   located on 1st digit on L foot 2/2 diabetes  . EYE SURGERY      There were no vitals filed for this visit.  Subjective Assessment - 03/10/19 0726    Subjective  Pt is feeling better today; is still working on getting the motivation to do more walking and exercises at home due to fear of falling.  Is sleeping better and is getting up on her own to go to the [facility].    Patient is accompained by:  Family member    Pertinent History  Patient had a R CVA 02/2018 and was walking with a SPC prior to a fall in March 2020 resulting in being hospitalized with multiple pelvic fxs and a L hip fx. During hospital stay, patient acquired pressure ulcers on bilateral soles of feet near heels.Patient is now safely able to bear weight and  pressure ulcers are now a stage 1 (per daughter report and photos).    Patient Stated Goals  "Be back to normal, get back to walking, and getting strength back"    Currently in Pain?  No/denies                       [facility] Adult PT Treatment/Exercise - 03/10/19 0745      Transfers   Transfers  Sit to Stand;Stand to Sit    Sit to Stand  5: Supervision    Stand to Sit  5: Supervision    Stand Pivot Transfers  5: Supervision    Stand Pivot Transfer Details (indicate cue type and reason)  with 2 wheeled RW      Ambulation/Gait   Ambulation/Gait  Yes    Ambulation/Gait Assistance  5: Supervision    Ambulation/Gait Assistance Details  To prepare for walking program at home; simulated set up of two chairs 10 feet apart and walking from chair to chair with two wheeled RW with therapist providing supervision and verbal cues only.  Pt performed without any issues or LOB.  Advised pt and daughter to perform at home this afternoon to prepare pt for home walking program while daughter is  out of town.  Pt to perform with supervision of other daughter or husband.  Discussed ways to progress by moving chairs apart or making more laps.  Also discussed if pt has been sitting for a while she may have more LE tightness and need to stand and stretch before walking.    Ambulation Distance (Feet)  10 Feet   x 2   Assistive device  Rolling walker    Gait Pattern  Step-to pattern;Step-through pattern;Decreased step length - left;Decreased stride length;Decreased hip/knee flexion - left;Decreased dorsiflexion - left;Trunk flexed;Poor foot clearance - left    Ambulation Surface  Level;Indoor      Lumbar Exercises: Prone   Single Arm Raise  Right;Left;10 reps    Straight Leg Raise  10 reps    Straight Leg Raises Limitations  right and left leg    Opposite Arm/Leg Raise  Right arm/Left leg;Left arm/Right leg;5 reps    Opposite Arm/Leg Raise Limitations  When lifting LUE, lifting shoulder only              PT Education - 03/10/19 0927    Education Details  walking program at home with supervision, how emotions can be affected by CVA and fear-avoidance after fall - discussed role of counselor and how pt may benefit from referral to neuropsych.  Pt asking about electrical stimulation for strengthening; educated on use of e-stim as an adjunct to current strengthening program and reviewed contraindications.  Will further discuss at next session    Person(s) Educated  Patient;Child(ren)    Methods  Explanation;Demonstration    Comprehension  Verbalized understanding;Returned demonstration       PT Short Term Goals - 02/18/19 0917      PT SHORT TERM GOAL #1   Title  Pt will demonstrate ability to perform ongoing HEP (stretching and standing exercises) with supervision    Time  4    Period  Weeks    Status  Revised    Target Date  03/20/19      PT SHORT TERM GOAL #2   Title  Patient will increase BERG score to at least a 30/56 in order to help decrease risk of future falls.    Baseline  26/30    Time  4    Period  Weeks    Status  Revised    Target Date  03/20/19      PT SHORT TERM GOAL #3   Title  Pt will perform stand pivot transfers with rollator x 5 reps with distant supervision    Time  4    Period  Weeks    Status  Revised    Target Date  03/20/19      PT SHORT TERM GOAL #4   Title  Pt will ambulate x 115' with LRAD on level, indoor surfaces with distant supervision    Baseline  min guard x 200' with rollator    Time  4    Period  Weeks    Status  Revised    Target Date  03/20/19      PT SHORT TERM GOAL #5   Title  Pt will improve gait velocity with LRAD to >/= 1.0 ft/sec    Baseline  .71    Time  4    Period  Weeks    Status  Revised    Target Date  03/20/19      PT SHORT TERM GOAL #6   Title  Patient will negotiate 4 stairs with 2  rails, step to sequence with supervision    Baseline  Min A    Time  4    Period  Weeks    Status  Revised    Target  Date  03/20/19        PT Long Term Goals - 02/18/19 0827      PT LONG TERM GOAL #1   Title  Patient will report performing HEP atleast 5 days a week with husband's supervision    Baseline  daughter provides supervision/min A    Time  8    Period  Weeks    Status  Revised    Target Date  04/19/19      PT LONG TERM GOAL #2   Title  Pt will ambulate 230' with LRAD over indoor surfaces with supervision and will report ambulating short distances at home with LRAD and husband's supervision    Baseline  --    Time  8    Period  Weeks    Status  Revised    Target Date  04/19/19      PT LONG TERM GOAL #3   Title  Patient will increase BERG score to at least 34/56 in order to decrease risk of future falls.    Baseline  --    Time  8    Period  Weeks    Status  Revised    Target Date  04/19/19      PT LONG TERM GOAL #4   Title  Pt will perform sit <> stand and stand pivots with LRAD MOD I    Baseline  --    Time  8    Period  Weeks    Status  Revised    Target Date  04/19/19      PT LONG TERM GOAL #5   Title  Patient will increase gait speed to at least 1.8t/sec with LRAD in order to decrease risk of falls and improve household ambulation.    Baseline  --    Time  8    Period  Weeks    Status  Revised    Target Date  04/19/19      PT LONG TERM GOAL #6   Title  Pt will ambulate x 100' outside over pavement with LRAD and supervision-min A    Baseline  --    Time  8    Period  Weeks    Status  Revised    Target Date  04/19/19            Plan - 03/10/19 0941    Clinical Impression Statement  Pt demonstrating improved tolerance to activity today.  Pt tolerated being placed in prone to perform anterior chest and hip stretching with back, shoulder and hip extension strengthening.  Continued to discuss ways to work towards pt ambulating more at home with supervision.  Set up and simulated walking program at home, walking 10 feet between two chairs.  Pt able to perform with  supervision; advised pt and daughter to perform at home before daughter goes out of town.  Pt and daughter verbalized agreement.  Pt continues to make steady progress and continues to require encouragement to carryover gait training and HEP at home which will facilitate further progress and independence.  Will continue to address and progress towards LTG.    PT Treatment/Interventions  ADLs/Self Care Home Management;Gait training;DME Instruction;Stair training;Functional mobility training;Therapeutic activities;Patient/family education;Neuromuscular re-education;Balance training;Therapeutic exercise;Passive range of motion;Orthotic Fit/Training    PT  Next Visit Plan  How has walking program been going at home?  Trial Bioness on LLE for quad strength, ankle DF.  STG due 9/12.  Prone extension if pt able to tolerate.  Working on reorientation to midline with head laser - seated on rockerboard weight shifting keeping head in middle and then while performing sit <> stand, standing balance and ambulation.  Incorporate LUE use into session.  Standing balance with decreased UE support; work towards pt performing HEP and ambulation short distances with husband's supervision at home when daughter not present.  Work on extending gait for endurance and mobility, L weight shifting in standing and forward weight shifting-rockerboard.  Step ups and step downs to address stairs.    PT Home Exercise Plan  [id number]    Recommended Other Services  Maybe neuropsych for counseling/mood after CVA?    Consulted and Agree with Plan of Care  Patient;Family member/caregiver    Family Member Consulted  Daughter, [name]       Patient will benefit from skilled therapeutic intervention in order to improve the following deficits and impairments:  Abnormal gait, Decreased activity tolerance, Decreased balance, Decreased range of motion, Decreased mobility, Decreased endurance, Decreased skin integrity, Decreased strength, Difficulty  walking, Impaired perceived functional ability, Postural dysfunction  Visit Diagnosis: Muscle weakness (generalized)  Unsteadiness on feet  Hemiplegia and hemiparesis following cerebral infarction affecting left non-dominant side (HCC)  Other abnormalities of gait and mobility     Problem List Patient Active Problem List   Diagnosis Date Noted  . Iron deficiency anemia 02/03/2019  . Pelvic fracture ([place]) 09/21/2018  . Allergic reaction caused by a drug 05/06/2018  . Bilateral leg edema 04/13/2018  . HLD (hyperlipidemia) 03/23/2018  . Mood swings 03/23/2018  . Chronic kidney disease (CKD), stage III (moderate) (HCC)   . Embolic stroke ([place]) 99991111  . Left hemiparesis ([place])   . Diabetes mellitus type 2 in nonobese (HCC)   . Acute ischemic stroke ([place])   . Left arm weakness   . Acute CVA (cerebrovascular accident) ([place]) 02/24/2018  . Overweight (BMI 25.0-29.9) 02/24/2018  . CKD (chronic kidney disease), stage III ([place]) 02/24/2018  . Diabetes mellitus without complication ([place]) 0000000  . HTN (hypertension) 09/02/2017    Rico Junker, PT, DPT 03/10/19    9:49 AM    [facility] [address] [place], [place], [address] Phone: [phone]   Fax:  [phone]  Name: [name] MRN: [id number] Date of Birth: 1946/04/03

## 2019-03-10 NOTE — Patient Instructions (Signed)
Home Walking Program: Set up two chairs 10 feet apart at home.  Walk back and forth with RW 2 times.

## 2019-03-17 ENCOUNTER — Other Ambulatory Visit: Payer: Self-pay

## 2019-03-17 ENCOUNTER — Ambulatory Visit: Payer: Medicare Other | Admitting: Physical Therapy

## 2019-03-17 ENCOUNTER — Encounter: Payer: Self-pay | Admitting: Physical Therapy

## 2019-03-17 ENCOUNTER — Encounter: Payer: Self-pay | Admitting: Occupational Therapy

## 2019-03-17 ENCOUNTER — Ambulatory Visit: Payer: Medicare Other | Admitting: Occupational Therapy

## 2019-03-17 DIAGNOSIS — M6281 Muscle weakness (generalized): Secondary | ICD-10-CM

## 2019-03-17 DIAGNOSIS — R2689 Other abnormalities of gait and mobility: Secondary | ICD-10-CM

## 2019-03-17 DIAGNOSIS — G8929 Other chronic pain: Secondary | ICD-10-CM

## 2019-03-17 DIAGNOSIS — M25612 Stiffness of left shoulder, not elsewhere classified: Secondary | ICD-10-CM

## 2019-03-17 DIAGNOSIS — R2681 Unsteadiness on feet: Secondary | ICD-10-CM

## 2019-03-17 DIAGNOSIS — I69354 Hemiplegia and hemiparesis following cerebral infarction affecting left non-dominant side: Secondary | ICD-10-CM

## 2019-03-17 DIAGNOSIS — M25512 Pain in left shoulder: Secondary | ICD-10-CM

## 2019-03-17 DIAGNOSIS — R278 Other lack of coordination: Secondary | ICD-10-CM

## 2019-03-17 NOTE — Therapy (Signed)
[facility] [address] [place], [place], [address] Phone: [phone]   Fax:  [phone]  Occupational Therapy Treatment  Patient Details  Name: [name] MRN: [id number] Date of Birth: [birth year] Referring Provider (OT): Dr. Letta Pate   Encounter Date: 03/17/2019  OT End of Session - 03/17/19 1026    Visit Number  22    Number of Visits  29    Date for OT Re-Evaluation  04/20/19    Authorization Type  [employer] MCR    Authorization Time Period  cert. period 02/19/19-05/20/19    Authorization - Visit Number  19    Authorization - Number of Visits  30    OT Start Time  1024    OT Stop Time  1105    OT Time Calculation (min)  41 min    Activity Tolerance  Patient tolerated treatment well    Behavior During Therapy  [facility] for tasks assessed/performed       Past Medical History:  Diagnosis Date  . Allergy   . Arthritis   . Cataract   . Diabetes mellitus without complication ([place])   . Hypertension   . Macular degeneration syndrome    with edema---being treated.     Past Surgical History:  Procedure Laterality Date  . callous removal  Left 2017   located on 1st digit on L foot 2/2 diabetes  . EYE SURGERY      There were no vitals filed for this visit.  Subjective Assessment - 03/17/19 1025    Subjective   Pt reports that she fell getting up out of w/c (wasn't locked) when going to the bathroom (husband had to assist to get up)    Patient is accompanied by:  Family member   daughter   Pertinent History  CVA 02/27/18, pelvic fx and Lt hip  fx 09/2018. PMH: HTN, OA    Limitations  Fall risk, wounds on feet    Currently in Pain?  No/denies    Pain Onset  More than a month ago         Stringing large beads for bilateral hand coordination.  Pt cued to use LUE to thread bead and needed min-mod cues for learned nonuse and demo min difficulty/incr time.    Placing large pegs in pegboard with cueing for 3 point  pinch with mod difficulty with manipulation and strength to push peg, min-mod cueing for technique.  Picking up checkers to hold 2 in hand with 1 in palm with focus on translation/in-hand manipulation with min facilitation/mod cues.  Then placed in connect 4 slots 1 at a time.  Folding towel with mod difficulty using LUE and min cueing for incr use.           OT Short Term Goals - 03/17/19 2217      OT SHORT TERM GOAL #1   Title  Independent with initial HEP for shoulder ROM, and functional reaching LUE    Time  4    Period  Weeks    Status  Achieved      OT SHORT TERM GOAL #2   Title  Pt to perform UB dressing with set up only and LE dressing with mod assist or less using A/E prn    Time  4    Period  Weeks    Status  Achieved      OT SHORT TERM GOAL #3   Title  Pt to increase LUE functional use as evidenced  by performing 20 blocks on Box & Blocks test.--STGs due 03/21/19    Baseline  11 blocks    Time  4    Period  Weeks    Status  On-going   01/27/19:  11 blocks, 02/17/19:  13     OT SHORT TERM GOAL #4   Title  Pt to perform toileting/clothes management with only min assist    Period  Weeks    Status  Achieved   Able to complete - simulation -  in clinic.  01/27/19:  met per pt/dtr report     OT SHORT TERM GOAL #5   Title  Pt to achieve 90* shoulder flexion LUE for mid level reaching    Time  4    Period  Weeks    Status  On-going   01/18/19:  75* with min compensation.  02/17/19:  85* with min compensation.  03/09/19: inconsistent       OT Long Term Goals - 02/19/19 1005      OT LONG TERM GOAL #1   Title  Independent with updated HEP - due 04/20/19    Time  8    Period  Weeks    Status  On-going      OT LONG TERM GOAL #2   Title  Pt to be mod I with all BADLS using A/E as needed (except shower transfers to still be supervision)    Time  8    Period  Weeks    Status  On-going   02/17/19:  assist for compression stockings, min A for depends, supervision for  shower transfer (mod I for bathing), toileting with BSC mod I     OT LONG TERM GOAL #3   Title  Pt to improve LUE function as evidenced by performing 25 blocks or greater on Box & Blocks test    Time  8    Period  Weeks    Status  On-going   02/17/19:  13     OT LONG TERM GOAL #4   Title  Pt to improve coordination as evidenced by performing 9 hole peg test completely in 90 sec or less    Baseline  placed 5 in 2 min.    Time  8    Period  Weeks    Status  On-going   02/17/19:  5 pegs within 4mn.  completed in 466m 43sec     OT LONG TERM GOAL #5   Title  Grip strength Lt hand to be 15 lbs or greater    Baseline  8 lbs    Time  8    Period  Weeks    Status  On-going   02/17/19:  5lbs     OT LONG TERM GOAL #6   Title  Pt to perform simple snack, cold food, and microwaveable food mod I level with DME prn without LOB    Time  8    Period  Weeks    Status  On-going   02/17/19:  not performing     OT LONG TERM GOAL #7   Title  Pt to achieve 110* or greater P/ROM Lt shoulder with pain less than or equal to 5/10    Time  8    Period  Weeks    Status  Achieved   02/17/19:  110-115* in sitting with 4-5/10 pain           Plan - 03/17/19 2212    Clinical Impression Statement  Pt  demo slowly improving in-hand manipulation today, but continues to need min cueing for incr LUE functional use due to learned nonuse.    OT Occupational Profile and History  Detailed Assessment- Review of Records and additional review of physical, cognitive, psychosocial history related to current functional performance    Occupational performance deficits (Please refer to evaluation for details):  ADL's;IADL's;Leisure    Body Structure / Function / Physical Skills  ADL;ROM;Balance;IADL;Body mechanics;Improper spinal/pelvic alignment;Mobility;Strength;Flexibility;FMC;Coordination;UE functional use;Proprioception;Decreased knowledge of use of DME    Rehab Potential  Good    Clinical Decision Making  Several  treatment options, min-mod task modification necessary    Comorbidities Affecting Occupational Performance:  Presence of comorbidities impacting occupational performance    Comorbidities impacting occupational performance description:  Wounds bilateral feet, Healed recent hip and pelvic fx's    Modification or Assistance to Complete Evaluation   Min-Moderate modification of tasks or assist with assess necessary to complete eval    OT Frequency  2x / week    OT Duration  8 weeks    OT Treatment/Interventions  Self-care/ADL training;Therapeutic exercise;Functional Mobility Training;Neuromuscular education;Aquatic Therapy;Manual Therapy;Splinting;Therapeutic activities;Paraffin;DME and/or AE instruction;Cognitive remediation/compensation;Electrical Stimulation;Visual/perceptual remediation/compensation;Moist Heat;Passive range of motion;Patient/family education    Plan  LUE functional use, neuro re-ed, trunk control; check STGs    Consulted and Agree with Plan of Care  Patient;Family member/caregiver    Family Member Consulted  Daughter Unk [place])       Patient will benefit from skilled therapeutic intervention in order to improve the following deficits and impairments:   Body Structure / Function / Physical Skills: ADL, ROM, Balance, IADL, Body mechanics, Improper spinal/pelvic alignment, Mobility, Strength, Flexibility, FMC, Coordination, UE functional use, Proprioception, Decreased knowledge of use of DME       Visit Diagnosis: Hemiplegia and hemiparesis following cerebral infarction affecting left non-dominant side (HCC)  Unsteadiness on feet  Muscle weakness (generalized)  Other abnormalities of gait and mobility  Other lack of coordination  Stiffness of left shoulder, not elsewhere classified  Chronic left shoulder pain    Problem List Patient Active Problem List   Diagnosis Date Noted  . Iron deficiency anemia 02/03/2019  . Pelvic fracture ([place]) 09/21/2018  . Allergic  reaction caused by a drug 05/06/2018  . Bilateral leg edema 04/13/2018  . HLD (hyperlipidemia) 03/23/2018  . Mood swings 03/23/2018  . Chronic kidney disease (CKD), stage III (moderate) (HCC)   . Embolic stroke ([place]) 15/83/0940  . Left hemiparesis ([place])   . Diabetes mellitus type 2 in nonobese (HCC)   . Acute ischemic stroke ([place])   . Left arm weakness   . Acute CVA (cerebrovascular accident) ([place]) 02/24/2018  . Overweight (BMI 25.0-29.9) 02/24/2018  . CKD (chronic kidney disease), stage III ([place]) 02/24/2018  . Diabetes mellitus without complication ([place]) 76/80/8811  . HTN (hypertension) 09/02/2017    [facility] 03/17/2019, 10:18 PM  [facility] [address] [place], [place], [address] Phone: [phone]   Fax:  [phone]  Name: TATELYN VANHECKE MRN: [id number] Date of Birth: January 19, 1946   Vianne Bulls, OTR/L [facility] [address] [place], [place]  [address] [phone] phone [phone] 03/17/19 10:31 PM

## 2019-03-18 NOTE — Therapy (Signed)
[facility] [address] [place], [place], [address] Phone: [phone]   Fax:  [phone]  Physical Therapy Treatment  Patient Details  Name: [name] MRN: [id number] Date of Birth: 1946-02-16 Referring Provider (PT): Alysia Penna, MD   Encounter Date: 03/17/2019  PT End of Session - 03/17/19 0939    Visit Number  31    Number of Visits  40    Date for PT Re-Evaluation  04/19/19    Authorization Type  [employer] Medicare, 10th visit progress note    PT Start Time  0933    PT Stop Time  1014    PT Time Calculation (min)  41 min    Activity Tolerance  Patient tolerated treatment well    Behavior During Therapy  [facility] for tasks assessed/performed       Past Medical History:  Diagnosis Date  . Allergy   . Arthritis   . Cataract   . Diabetes mellitus without complication ([place])   . Hypertension   . Macular degeneration syndrome    with edema---being treated.     Past Surgical History:  Procedure Laterality Date  . callous removal  Left 2017   located on 1st digit on L foot 2/2 diabetes  . EYE SURGERY      There were no vitals filed for this visit.  Subjective Assessment - 03/17/19 0936    Subjective  Has a spill on Friday, went to sit in wheelchair that was not locked. Spouse was with her. He picked her up. Denies any injuries. No pain to report.    Patient is accompained by:  Family member   daughter Wyatt Portela   Pertinent History  Patient had a R CVA 02/2018 and was walking with a SPC prior to a fall in March 2020 resulting in being hospitalized with multiple pelvic fxs and a L hip fx. During hospital stay, patient acquired pressure ulcers on bilateral soles of feet near heels.Patient is now safely able to bear weight and pressure ulcers are now a stage 1 (per daughter report and photos).    Patient Stated Goals  "Be back to normal, get back to walking, and getting strength back"    Currently in Pain?   No/denies    Pain Score  0-No pain          [facility] PT Assessment - 03/17/19 0942      Transfers   Transfers  Sit to Stand;Stand to Sit    Sit to Stand  5: Supervision;With upper extremity assist;From bed;From chair/3-in-1    Stand to Sit  5: Supervision;With upper extremity assist;To bed;To chair/3-in-1    Stand Pivot Transfers  5: Supervision    Stand Pivot Transfer Details (indicate cue type and reason)  with RW for 5 reps with supervision      Standardized Balance Assessment   Standardized Balance Assessment  10 meter walk test;Berg Balance Test    10 Meter Walk  56.74 sec's       Berg Balance Test   Sit to Stand  Able to stand  independently using hands    Standing Unsupported  Able to stand 2 minutes with supervision    Sitting with Back Unsupported but Feet Supported on Floor or Stool  Able to sit safely and securely 2 minutes    Stand to Sit  Sits safely with minimal use of hands    Transfers  Able to transfer safely, definite need of hands    Standing  Unsupported with Eyes Closed  Able to stand 10 seconds with supervision    Standing Unsupported with Feet Together  Able to place feet together independently and stand for 1 minute with supervision    From Standing, Reach Forward with Outstretched Arm  Can reach forward >12 cm safely (5")   6 inches   From Standing Position, Pick up Object from [place] to pick up shoe, needs supervision    From Standing Position, Turn to Look Behind Over each Shoulder  Turn sideways only but maintains balance   needs supervision to turn further with no balance loss   Turn 360 Degrees  Needs close supervision or verbal cueing    Standing Unsupported, Alternately Place Feet on Step/Stool  Needs assistance to keep from falling or unable to try    Standing Unsupported, One Foot in [place] to take small step independently and hold 30 seconds    Standing on One Leg  Tries to lift leg/unable to hold 3 seconds but remains standing independently     Total Score  35    Berg comment:  35/56         [facility] Adult PT Treatment/Exercise - 03/17/19 0942      Self-Care   Self-Care  Other Self-Care Comments    Other Self-Care Comments   Discussed current HEP. Has not done the walking program at home since issued last visit. Does report consistency with all other ex's given to her with assistance of daughter or spouse.           PT Short Term Goals - 03/17/19 0941      PT SHORT TERM GOAL #1   Title  Pt will demonstrate ability to perform ongoing HEP (stretching and standing exercises) with supervision    Baseline  03/17/19: pt reports consistently doing HEP with daugher, sometimes spouse, assistance    Time  --    Period  --    Status  Achieved    Target Date  03/20/19      PT SHORT TERM GOAL #2   Title  Patient will increase BERG score to at least a 30/56 in order to help decrease risk of future falls.    Baseline  03/17/19: 35/56 scored today    Time  --    Period  --    Status  Achieved    Target Date  03/20/19      PT SHORT TERM GOAL #3   Title  Pt will perform stand pivot transfers with rollator x 5 reps with distant supervision    Baseline  03/17/19: met with  supervision (PTA near wheelchair) with RW (does not use rollator), no physical assistance needed, only cues.    Time  --    Period  --    Status  Achieved    Target Date  03/20/19      PT SHORT TERM GOAL #4   Title  Pt will ambulate x 115' with LRAD on level, indoor surfaces with distant supervision    Baseline  min guard x 200' with rollator    Time  4    Period  Weeks    Status  On-going    Target Date  03/20/19      PT SHORT TERM GOAL #5   Title  Pt will improve gait velocity with LRAD to >/= 1.0 ft/sec    Baseline  03/17/19: 0.58 ft/sec, slower that last score of 0.71 ft/sec    Time  4    Period  Weeks    Status  Not Met    Target Date  03/20/19      PT SHORT TERM GOAL #6   Title  Patient will negotiate 4 stairs with 2 rails, step to sequence with  supervision    Baseline  Min A    Time  4    Period  Weeks    Status  On-going    Target Date  03/20/19        PT Long Term Goals - 02/18/19 0827      PT LONG TERM GOAL #1   Title  Patient will report performing HEP atleast 5 days a week with husband's supervision    Baseline  daughter provides supervision/min A    Time  8    Period  Weeks    Status  Revised    Target Date  04/19/19      PT LONG TERM GOAL #2   Title  Pt will ambulate 230' with LRAD over indoor surfaces with supervision and will report ambulating short distances at home with LRAD and husband's supervision    Baseline  --    Time  8    Period  Weeks    Status  Revised    Target Date  04/19/19      PT LONG TERM GOAL #3   Title  Patient will increase BERG score to at least 34/56 in order to decrease risk of future falls.    Baseline  --    Time  8    Period  Weeks    Status  Revised    Target Date  04/19/19      PT LONG TERM GOAL #4   Title  Pt will perform sit <> stand and stand pivots with LRAD MOD I    Baseline  --    Time  8    Period  Weeks    Status  Revised    Target Date  04/19/19      PT LONG TERM GOAL #5   Title  Patient will increase gait speed to at least 1.8t/sec with LRAD in order to decrease risk of falls and improve household ambulation.    Baseline  --    Time  8    Period  Weeks    Status  Revised    Target Date  04/19/19      PT LONG TERM GOAL #6   Title  Pt will ambulate x 100' outside over pavement with LRAD and supervision-min A    Baseline  --    Time  8    Period  Weeks    Status  Revised    Target Date  04/19/19            Plan - 03/17/19 0940    Clinical Impression Statement  Today's skilled session focused on progress toward STGs with pt meeting her HEP goal, her Merrilee Jansky Balance Test goal with score of 35/56 and her transfer goal with RW. Did not meet her gait speed goal, was slower that when last checked. Of note this was done at the end of the session when she  was fatigued, may improve with recheck next session. Pt's daughter Wyatt Portela with her today as [name] is on vacation. She is inquiring on getting in home care/assistance for pt to help decrease the burden of care on [name] and pt's spouse. Discussed starting with calling pt's insurance to see if/what they offer/cover.  Also discussed [employer] as one that advertises. She plans to contact the insurance company for now. The pt reports she will be getting Medicaid (? secondary) in October and that may also offer options.    PT Treatment/Interventions  ADLs/Self Care Home Management;Gait training;DME Instruction;Stair training;Functional mobility training;Therapeutic activities;Patient/family education;Neuromuscular re-education;Balance training;Therapeutic exercise;Passive range of motion;Orthotic Fit/Training    PT Next Visit Plan  See if Wyatt Portela helped her with the walking program from chair to chair?. check remaining STGs- would recheck gait speed to see if its better when she is not so fatigued; set up Bioness to LLE for quad strength, ankle DF.    PT Home Exercise Plan  [id number]    Consulted and Agree with Plan of Care  Patient;Family member/caregiver    Family Member Consulted  Daughter, Wyatt Portela       Patient will benefit from skilled therapeutic intervention in order to improve the following deficits and impairments:  Abnormal gait, Decreased activity tolerance, Decreased balance, Decreased range of motion, Decreased mobility, Decreased endurance, Decreased skin integrity, Decreased strength, Difficulty walking, Impaired perceived functional ability, Postural dysfunction  Visit Diagnosis: Hemiplegia and hemiparesis following cerebral infarction affecting left non-dominant side (HCC)  Unsteadiness on feet  Muscle weakness (generalized)  Other abnormalities of gait and mobility     Problem List Patient Active Problem List   Diagnosis Date Noted  . Iron deficiency anemia 02/03/2019  .  Pelvic fracture ([place]) 09/21/2018  . Allergic reaction caused by a drug 05/06/2018  . Bilateral leg edema 04/13/2018  . HLD (hyperlipidemia) 03/23/2018  . Mood swings 03/23/2018  . Chronic kidney disease (CKD), stage III (moderate) (HCC)   . Embolic stroke ([place]) 86/77/3736  . Left hemiparesis ([place])   . Diabetes mellitus type 2 in nonobese (HCC)   . Acute ischemic stroke ([place])   . Left arm weakness   . Acute CVA (cerebrovascular accident) ([place]) 02/24/2018  . Overweight (BMI 25.0-29.9) 02/24/2018  . CKD (chronic kidney disease), stage III ([place]) 02/24/2018  . Diabetes mellitus without complication ([place]) 68/15/9470  . HTN (hypertension) 09/02/2017    Willow Ora, PTA, [facility] [address], [place], [place] [address] [phone] 03/18/19, 2:33 PM   Name: [name] MRN: [id number] Date of Birth: Jun 30, 1946

## 2019-03-19 ENCOUNTER — Ambulatory Visit: Payer: Medicare Other | Admitting: Occupational Therapy

## 2019-03-19 ENCOUNTER — Encounter: Payer: Self-pay | Admitting: Occupational Therapy

## 2019-03-19 ENCOUNTER — Other Ambulatory Visit: Payer: Self-pay

## 2019-03-19 ENCOUNTER — Ambulatory Visit: Payer: Medicare Other | Admitting: Rehabilitative and Restorative Service Providers"

## 2019-03-19 DIAGNOSIS — M6281 Muscle weakness (generalized): Secondary | ICD-10-CM

## 2019-03-19 DIAGNOSIS — R278 Other lack of coordination: Secondary | ICD-10-CM

## 2019-03-19 DIAGNOSIS — R2681 Unsteadiness on feet: Secondary | ICD-10-CM

## 2019-03-19 DIAGNOSIS — M25612 Stiffness of left shoulder, not elsewhere classified: Secondary | ICD-10-CM

## 2019-03-19 DIAGNOSIS — I69354 Hemiplegia and hemiparesis following cerebral infarction affecting left non-dominant side: Secondary | ICD-10-CM | POA: Diagnosis not present

## 2019-03-19 DIAGNOSIS — R2689 Other abnormalities of gait and mobility: Secondary | ICD-10-CM

## 2019-03-19 NOTE — Therapy (Signed)
[facility] [address] [place], [place], [address] Phone: [phone]   Fax:  [phone]  Physical Therapy Treatment  Patient Details  Name: [name] MRN: [id number] Date of Birth: [birth year] Referring Provider (PT): Alysia Penna, MD   Encounter Date: 03/19/2019  PT End of Session - 03/19/19 1103    Visit Number  32    Number of Visits  40    Date for PT Re-Evaluation  04/19/19    Authorization Type  [employer] Medicare, 10th visit progress note    PT Start Time  5038    PT Stop Time  1103    PT Time Calculation (min)  40 min    Activity Tolerance  Patient tolerated treatment well    Behavior During Therapy  [facility] for tasks assessed/performed       Past Medical History:  Diagnosis Date  . Allergy   . Arthritis   . Cataract   . Diabetes mellitus without complication ([place])   . Hypertension   . Macular degeneration syndrome    with edema---being treated.     Past Surgical History:  Procedure Laterality Date  . callous removal  Left 2017   located on 1st digit on L foot 2/2 diabetes  . EYE SURGERY      There were no vitals filed for this visit.  Subjective Assessment - 03/19/19 1025    Subjective  The patient reports she is scared of falls and this is her main barrier to walking more at home.  Her daughter reports she is moving slower today.    Pertinent History  Patient had a R CVA 02/2018 and was walking with a SPC prior to a fall in March 2020 resulting in being hospitalized with multiple pelvic fxs and a L hip fx. During hospital stay, patient acquired pressure ulcers on bilateral soles of feet near heels.Patient is now safely able to bear weight and pressure ulcers are now a stage 1 (per daughter report and photos).    Patient Stated Goals  "Be back to normal, get back to walking, and getting strength back"    Currently in Pain?  No/denies   however duirng reaching tasks in seated, she c/o L  shoulder pain "mild"                      [facility] Adult PT Treatment/Exercise - 03/19/19 1604      Ambulation/Gait   Ambulation/Gait  Yes    Ambulation/Gait Assistance  4: Min guard    Ambulation/Gait Assistance Details  The patient reported more fatigue today with gait and standing.  PT provided CGA t/o standing activities    Ambulation Distance (Feet)  115 Feet   25 x 2, 70 feet   Assistive device  Rolling walker    Gait Pattern  Step-to pattern;Decreased step length - left;Decreased stride length;Decreased hip/knee flexion - left;Decreased dorsiflexion - left;Trunk flexed;Poor foot clearance - left    Ambulation Surface  Level;Indoor    Stairs  Yes    Stairs Assistance  4: Min guard    Stairs Assistance Details (indicate cue type and reason)  *The patient reports she does steps with supervision at home, however she needed CGA for safety today during all standing moblility    Stair Management Technique  One rail Right;Step to pattern    Number of Stairs  4    Gait Comments  Stairs were performed at end of session and the patient needed  min to mod A to return to the wheelchair at the bottom of the steps.  She had the RW and felt like her legs would not hold her to turn to sit in the w/c.  She gets fearful and then does not move her legs.  PT had to provide encouragement to ensure she safely got to the w/c.      Neuro Re-ed    Neuro Re-ed Details   Anterior reaching pushing physioball away in a seated position and holding "nose over toes" positioning.  Performed x 3 reps.  Then performed lateral reaching using only the R UE due to pain in the L shoulder.  Wall bumps with RW near for support and CGA.  Patirent worked on upright standing and glut activation for improved posture.      Exercises   Exercises  Other Exercises    Other Exercises   supine PROM hamstring stretch, gastroc stretch, and lumbar rocking to improve moblity for posture.                PT Short  Term Goals - 03/19/19 1618      PT SHORT TERM GOAL #1   Title  Pt will demonstrate ability to perform ongoing HEP (stretching and standing exercises) with supervision    Baseline  03/17/19: pt reports consistently doing HEP with daugher, sometimes spouse, assistance    Status  Achieved    Target Date  03/20/19      PT SHORT TERM GOAL #2   Title  Patient will increase BERG score to at least a 30/56 in order to help decrease risk of future falls.    Baseline  03/17/19: 35/56 scored today    Status  Achieved    Target Date  03/20/19      PT SHORT TERM GOAL #3   Title  Pt will perform stand pivot transfers with rollator x 5 reps with distant supervision    Baseline  03/17/19: met with  supervision (PTA near wheelchair) with RW (does not use rollator), no physical assistance needed, only cues.    Status  Achieved    Target Date  03/20/19      PT SHORT TERM GOAL #4   Title  Pt will ambulate x 115' with LRAD on level, indoor surfaces with distant supervision    Baseline  min guard x 200' with rollator    Time  4    Period  Weeks    Status  Not Met    Target Date  03/20/19      PT SHORT TERM GOAL #5   Title  Pt will improve gait velocity with LRAD to >/= 1.0 ft/sec    Baseline  03/17/19: 0.58 ft/sec, slower that last score of 0.71 ft/sec    Time  4    Period  Weeks    Status  Not Met    Target Date  03/20/19      PT SHORT TERM GOAL #6   Title  Patient will negotiate 4 stairs with 2 rails, step to sequence with supervision    Baseline  Patient needs CGA for safety    Time  4    Period  Weeks    Status  Partially Met    Target Date  03/20/19        PT Long Term Goals - 02/18/19 0827      PT LONG TERM GOAL #1   Title  Patient will report performing HEP atleast 5 days a  week with husband's supervision    Baseline  daughter provides supervision/min A    Time  8    Period  Weeks    Status  Revised    Target Date  04/19/19      PT LONG TERM GOAL #2   Title  Pt will ambulate  66' with LRAD over indoor surfaces with supervision and will report ambulating short distances at home with LRAD and husband's supervision    Baseline  --    Time  8    Period  Weeks    Status  Revised    Target Date  04/19/19      PT LONG TERM GOAL #3   Title  Patient will increase BERG score to at least 34/56 in order to decrease risk of future falls.    Baseline  --    Time  8    Period  Weeks    Status  Revised    Target Date  04/19/19      PT LONG TERM GOAL #4   Title  Pt will perform sit <> stand and stand pivots with LRAD MOD I    Baseline  --    Time  8    Period  Weeks    Status  Revised    Target Date  04/19/19      PT LONG TERM GOAL #5   Title  Patient will increase gait speed to at least 1.8t/sec with LRAD in order to decrease risk of falls and improve household ambulation.    Baseline  --    Time  8    Period  Weeks    Status  Revised    Target Date  04/19/19      PT LONG TERM GOAL #6   Title  Pt will ambulate x 100' outside over pavement with LRAD and supervision-min A    Baseline  --    Time  8    Period  Weeks    Status  Revised    Target Date  04/19/19            Plan - 03/19/19 1610    Clinical Impression Statement  The patient required greater assistance today and appeared to be more anxious with overall mobility.  This may be due to her primary caregiver being out of town this week.  The patient is not participating in home activities and cites fear of falling and laziness as her two main obstacles.  PT recommended a regular routine once her primary caregiver returns.    PT Treatment/Interventions  ADLs/Self Care Home Management;Gait training;DME Instruction;Stair training;Functional mobility training;Therapeutic activities;Patient/family education;Neuromuscular re-education;Balance training;Therapeutic exercise;Passive range of motion;Orthotic Fit/Training    PT Next Visit Plan  Gait speed was not better today, set up Bioness for L LE for quad  strength?  (may not be beneficial unless patient willing to improve functional carryover of gait), LE strengthening, functional mobility.    PT Home Exercise Plan  [id number]    Consulted and Agree with Plan of Care  Patient;Family member/caregiver    Family Member Consulted  Daughter, Wyatt Portela at end of session       Patient will benefit from skilled therapeutic intervention in order to improve the following deficits and impairments:  Abnormal gait, Decreased activity tolerance, Decreased balance, Decreased range of motion, Decreased mobility, Decreased endurance, Decreased skin integrity, Decreased strength, Difficulty walking, Impaired perceived functional ability, Postural dysfunction  Visit Diagnosis: Unsteadiness on feet  Other abnormalities of gait  and mobility  Muscle weakness (generalized)     Problem List Patient Active Problem List   Diagnosis Date Noted  . Iron deficiency anemia 02/03/2019  . Pelvic fracture ([place]) 09/21/2018  . Allergic reaction caused by a drug 05/06/2018  . Bilateral leg edema 04/13/2018  . HLD (hyperlipidemia) 03/23/2018  . Mood swings 03/23/2018  . Chronic kidney disease (CKD), stage III (moderate) (HCC)   . Embolic stroke ([place]) 58/30/7460  . Left hemiparesis ([place])   . Diabetes mellitus type 2 in nonobese (HCC)   . Acute ischemic stroke ([place])   . Left arm weakness   . Acute CVA (cerebrovascular accident) ([facility]) 02/24/2018  . Overweight (BMI 25.0-29.9) 02/24/2018  . CKD (chronic kidney disease), stage III ([place]) 02/24/2018  . Diabetes mellitus without complication ([place]) 02/98/4730  . HTN (hypertension) 09/02/2017    Orvin Netter, PT 03/19/2019, 4:30 PM  [facility] [address] [place], [place], [address] Phone: [phone]   Fax:  [phone]  Name: [name] MRN: [id number] Date of Birth: 06-May-1946

## 2019-03-19 NOTE — Therapy (Signed)
[facility] [address] [place], [place], [address] Phone: [phone]   Fax:  [phone]  Occupational Therapy Treatment  Patient Details  Name: CAELYN ROUTE MRN: [id number] Date of Birth: 1945/12/25 Referring Provider (OT): Dr. Letta Pate   Encounter Date: 03/19/2019  OT End of Session - 03/19/19 1053    Visit Number  23    Number of Visits  29    Date for OT Re-Evaluation  04/20/19    Authorization Type  [employer] MCR    Authorization Time Period  cert. period 02/19/19-05/20/19    Authorization - Visit Number  23    Authorization - Number of Visits  30    OT Start Time  1104    OT Stop Time  1144    OT Time Calculation (min)  40 min    Activity Tolerance  Patient tolerated treatment well    Behavior During Therapy  WFL for tasks assessed/performed       Past Medical History:  Diagnosis Date  . Allergy   . Arthritis   . Cataract   . Diabetes mellitus without complication ([place])   . Hypertension   . Macular degeneration syndrome    with edema---being treated.     Past Surgical History:  Procedure Laterality Date  . callous removal  Left 2017   located on 1st digit on L foot 2/2 diabetes  . EYE SURGERY      There were no vitals filed for this visit.  Subjective Assessment - 03/19/19 1052    Subjective   tired from PT    Patient is accompanied by:  Family member   daughter   Pertinent History  CVA 02/27/18, pelvic fx and Lt hip  fx 09/2018. PMH: HTN, OA    Limitations  Fall risk, wounds on feet    Currently in Pain?  No/denies    Pain Onset  More than a month ago       In sitting supported in w/c with pillow behind back, closed chain shoulder flex, chest press, and abduction with BUEs with ball with min facilitation and cueing for alignment and incr scapular movement.   Mid-range functional reaching to place large washers on pole with min facilitation for improved proximal positioning and mod  difficulty/cueing for coordination/positioning.  Rotating ball in hand with mod A/cues and difficulty for in-hand manipulation/coordination.  Towel scrunches for finger flex/ext with min-mod cueing for full ROM.  Sliding cards off top of deck with finger ext with mod cueing.  Folding washcloth with L hand with min cueing/difficulty.  Manipulating 1-inch dice in hand to turn to given number with mod difficulty/drops.    Pt given mod cueing verbal/tactile for trunk control/lean to the L.         OT Short Term Goals - 03/19/19 1247      OT SHORT TERM GOAL #1   Title  Independent with initial HEP for shoulder ROM, and functional reaching LUE    Time  4    Period  Weeks    Status  Achieved      OT SHORT TERM GOAL #2   Title  Pt to perform UB dressing with set up only and LE dressing with mod assist or less using A/E prn    Time  4    Period  Weeks    Status  Achieved      OT SHORT TERM GOAL #3   Title  Pt to increase LUE functional use  as evidenced by performing 15 blocks on Box & Blocks test.--STGs due 03/21/19   (modified 03/19/19)    Baseline  11 blocks    Time  4    Period  Weeks    Status  Revised   01/27/19:  11 blocks, 02/17/19:  13, 03/19/19 10 blocks     OT SHORT TERM GOAL #4   Title  Pt to perform toileting/clothes management with only min assist    Period  Weeks    Status  Achieved   Able to complete - simulation -  in clinic.  01/27/19:  met per pt/dtr report     OT SHORT TERM GOAL #5   Title  Pt to achieve 90* shoulder flexion LUE for mid level reaching    Time  4    Period  Weeks    Status  On-going   01/18/19:  75* with min compensation.  02/17/19:  85* with min compensation.  03/09/19: inconsistent       OT Long Term Goals - 03/19/19 1248      OT LONG TERM GOAL #1   Title  Independent with updated HEP - due 04/20/19    Time  8    Period  Weeks    Status  On-going      OT LONG TERM GOAL #2   Title  Pt to be mod I with all BADLS using A/E as needed  (except shower transfers to still be supervision)    Time  8    Period  Weeks    Status  On-going   02/17/19:  assist for compression stockings, min A for depends, supervision for shower transfer (mod I for bathing), toileting with BSC mod I     OT LONG TERM GOAL #3   Title  Pt to improve LUE function as evidenced by performing 19 blocks or greater on Box & Blocks test--revised 03/19/19    Time  8    Period  Weeks    Status  Revised   02/17/19:  13     OT LONG TERM GOAL #4   Title  Pt to improve coordination as evidenced by performing 9 hole peg test completely in 90 sec or less    Baseline  placed 5 in 2 min.    Time  8    Period  Weeks    Status  On-going   02/17/19:  5 pegs within 27mn.  completed in 410m 43sec     OT LONG TERM GOAL #5   Title  Grip strength Lt hand to be 15 lbs or greater    Baseline  8 lbs    Time  8    Period  Weeks    Status  On-going   02/17/19:  5lbs     OT LONG TERM GOAL #6   Title  Pt to perform simple snack, cold food, and microwaveable food mod I level with DME prn without LOB    Time  8    Period  Weeks    Status  On-going   02/17/19:  not performing     OT LONG TERM GOAL #7   Title  Pt to achieve 110* or greater P/ROM Lt shoulder with pain less than or equal to 5/10    Time  8    Period  Weeks    Status  Achieved   02/17/19:  110-115* in sitting with 4-5/10 pain           Plan -  03/19/19 1053    Clinical Impression Statement  Pt with incr difficulty with trunk control today when sitting supported due to fatigue after PT.  Pt needed min facilitation for positioning with functional reach.    OT Occupational Profile and History  Detailed Assessment- Review of Records and additional review of physical, cognitive, psychosocial history related to current functional performance    Occupational performance deficits (Please refer to evaluation for details):  ADL's;IADL's;Leisure    Body Structure / Function / Physical Skills   ADL;ROM;Balance;IADL;Body mechanics;Improper spinal/pelvic alignment;Mobility;Strength;Flexibility;FMC;Coordination;UE functional use;Proprioception;Decreased knowledge of use of DME    Rehab Potential  Good    Clinical Decision Making  Several treatment options, min-mod task modification necessary    Comorbidities Affecting Occupational Performance:  Presence of comorbidities impacting occupational performance    Comorbidities impacting occupational performance description:  Wounds bilateral feet, Healed recent hip and pelvic fx's    Modification or Assistance to Complete Evaluation   Min-Moderate modification of tasks or assist with assess necessary to complete eval    OT Frequency  2x / week    OT Duration  8 weeks    OT Treatment/Interventions  Self-care/ADL training;Therapeutic exercise;Functional Mobility Training;Neuromuscular education;Aquatic Therapy;Manual Therapy;Splinting;Therapeutic activities;Paraffin;DME and/or AE instruction;Cognitive remediation/compensation;Electrical Stimulation;Visual/perceptual remediation/compensation;Moist Heat;Passive range of motion;Patient/family education    Plan  check remaining STG (shoulder ROM), snack prep, LUE functional use/trunk control, neuro re-ed    Consulted and Agree with Plan of Care  Patient;Family member/caregiver    Family Member Consulted  Daughter Unk Lightning)       Patient will benefit from skilled therapeutic intervention in order to improve the following deficits and impairments:   Body Structure / Function / Physical Skills: ADL, ROM, Balance, IADL, Body mechanics, Improper spinal/pelvic alignment, Mobility, Strength, Flexibility, FMC, Coordination, UE functional use, Proprioception, Decreased knowledge of use of DME       Visit Diagnosis: Hemiplegia and hemiparesis following cerebral infarction affecting left non-dominant side (HCC)  Unsteadiness on feet  Muscle weakness (generalized)  Other abnormalities of gait and  mobility  Other lack of coordination  Stiffness of left shoulder, not elsewhere classified    Problem List Patient Active Problem List   Diagnosis Date Noted  . Iron deficiency anemia 02/03/2019  . Pelvic fracture ([place]) 09/21/2018  . Allergic reaction caused by a drug 05/06/2018  . Bilateral leg edema 04/13/2018  . HLD (hyperlipidemia) 03/23/2018  . Mood swings 03/23/2018  . Chronic kidney disease (CKD), stage III (moderate) (HCC)   . Embolic stroke ([place]) 30/16/0109  . Left hemiparesis ([place])   . Diabetes mellitus type 2 in nonobese (HCC)   . Acute ischemic stroke ([place])   . Left arm weakness   . Acute CVA (cerebrovascular accident) ([place]) 02/24/2018  . Overweight (BMI 25.0-29.9) 02/24/2018  . CKD (chronic kidney disease), stage III ([place]) 02/24/2018  . Diabetes mellitus without complication ([place]) 32/35/5732  . HTN (hypertension) 09/02/2017    [facility] 03/19/2019, 12:51 PM  [facility] [address] [place], [place], [address] Phone: [phone]   Fax:  [phone]  Name: TOINI FAILLA MRN: [id number] Date of Birth: 1946-01-08   Vianne Bulls, OTR/L [facility] [address] [place], [place]  [address] [phone] phone [phone] 03/19/19 12:51 PM

## 2019-03-22 ENCOUNTER — Encounter: Payer: Self-pay | Admitting: Occupational Therapy

## 2019-03-22 ENCOUNTER — Encounter: Payer: Self-pay | Admitting: Physical Therapy

## 2019-03-22 ENCOUNTER — Ambulatory Visit: Payer: Medicare Other | Admitting: Rehabilitative and Restorative Service Providers"

## 2019-03-22 ENCOUNTER — Ambulatory Visit: Payer: Medicare Other | Admitting: Occupational Therapy

## 2019-03-23 ENCOUNTER — Encounter: Payer: Self-pay | Admitting: Family Medicine

## 2019-03-24 ENCOUNTER — Ambulatory Visit: Payer: Medicare Other | Admitting: Occupational Therapy

## 2019-03-24 ENCOUNTER — Encounter: Payer: Self-pay | Admitting: Physical Therapy

## 2019-03-24 ENCOUNTER — Ambulatory Visit: Payer: Medicare Other | Admitting: Physical Therapy

## 2019-03-24 ENCOUNTER — Other Ambulatory Visit: Payer: Self-pay

## 2019-03-24 ENCOUNTER — Encounter: Payer: Self-pay | Admitting: Family Medicine

## 2019-03-24 DIAGNOSIS — I69354 Hemiplegia and hemiparesis following cerebral infarction affecting left non-dominant side: Secondary | ICD-10-CM | POA: Diagnosis not present

## 2019-03-24 DIAGNOSIS — R2681 Unsteadiness on feet: Secondary | ICD-10-CM

## 2019-03-24 DIAGNOSIS — R2689 Other abnormalities of gait and mobility: Secondary | ICD-10-CM

## 2019-03-24 DIAGNOSIS — M6281 Muscle weakness (generalized): Secondary | ICD-10-CM

## 2019-03-24 NOTE — Therapy (Signed)
[facility] [address] [place], [place], [address] Phone: [phone]   Fax:  [phone]  Physical Therapy Treatment  Patient Details  Name: [name] MRN: [id number] Date of Birth: 11-Oct-1945 Referring Provider (PT): Alysia Penna, MD   Encounter Date: 03/24/2019  PT End of Session - 03/24/19 1205    Visit Number  33    Number of Visits  40    Date for PT Re-Evaluation  04/19/19    Authorization Type  U[employer] Medicare, 10th visit progress note    PT Start Time  0900   arrived late   PT Stop Time  0935    PT Time Calculation (min)  35 min    Activity Tolerance  Patient tolerated treatment well    Behavior During Therapy  [facility] for tasks assessed/performed       Past Medical History:  Diagnosis Date  . Allergy   . Arthritis   . Cataract   . Diabetes mellitus without complication ([place])   . Hypertension   . Macular degeneration syndrome    with edema---being treated.     Past Surgical History:  Procedure Laterality Date  . callous removal  Left 2017   located on 1st digit on L foot 2/2 diabetes  . EYE SURGERY      There were no vitals filed for this visit.  Subjective Assessment - 03/24/19 0904    Subjective  Late this morning due to traffice.  Has had a two week flare of diverticulitis; settled yesterday.  Thinks this is why she had a hard time at therapy last week.  Reports she is still doing minimal standing/walking at home - is fearful of falling when walking away from the chair.  Daughter also states that patient's husband really limits what he will let patient try at home.    Pertinent History  Patient had a R CVA 02/2018 and was walking with a SPC prior to a fall in March 2020 resulting in being hospitalized with multiple pelvic fxs and a L hip fx. During hospital stay, patient acquired pressure ulcers on bilateral soles of feet near heels.Patient is now safely able to bear weight and pressure  ulcers are now a stage 1 (per daughter report and photos).    Patient Stated Goals  "Be back to normal, get back to walking, and getting strength back"    Currently in Pain?  No/denies                       [facility] Adult PT Treatment/Exercise - 03/24/19 1147      Transfers   Transfers  Sit to Stand;Stand to [employer] Transfers    Sit to Stand  5: Supervision    Stand to Sit  5: Supervision    Stand Pivot Transfers  5: Supervision    Stand Pivot Transfer Details (indicate cue type and reason)  with UE support w/c <> Nustep      Therapeutic Activites    Therapeutic Activities  ADL's;Other Therapeutic Activities    ADL's  Performed prolonged standing at sink for >4 minutes simulating various ADL tasks including brushing teeth x 2 minutes - using both UE to simulate putting toothpast on brush, one hand performing face washing and other hand providing support; pt would switch hands at times to involve LUE.  Pt able to self correct balance and did not require any assistance from therapist.    Other Therapeutic Activities  continued  discussion with pt and daughter regarding plan for PT and going forwards and barriers to increasing activity level at home.  Pt spends most of her time at home alone with husband and daughter states that husband significantly limits what pt does due to fear of her falling again.  Pt also continues to be fearful of falling.  Daughter states they are trying to get pt set up with Medicaid so she can have an in home aide to assist pt with standing and ambulation tasks/ADL for more consistent carry over.  Therapist recommending finding 3 activities that patient can perform every day in standing safely and then video her performing it in therapy and showing video to husband to allow husband to feel more comfortable with pt performing at home.  Discussed the following activities to perform in standing at sink: wash face, brush teeth and brush hair.         Knee/Hip Exercises: Aerobic   Nustep  Level 5 resistance with bilat UE and LE x 10 minutes for UE ROM, strengthening and aerobic endurance             PT Education - 03/24/19 1204    Education Details  see TA    Person(s) Educated  Patient;Child(ren)    Methods  Explanation;Demonstration    Comprehension  Verbalized understanding;Returned demonstration       PT Short Term Goals - 03/19/19 1618      PT SHORT TERM GOAL #1   Title  Pt will demonstrate ability to perform ongoing HEP (stretching and standing exercises) with supervision    Baseline  03/17/19: pt reports consistently doing HEP with daugher, sometimes spouse, assistance    Status  Achieved    Target Date  03/20/19      PT SHORT TERM GOAL #2   Title  Patient will increase BERG score to at least a 30/56 in order to help decrease risk of future falls.    Baseline  03/17/19: 35/56 scored today    Status  Achieved    Target Date  03/20/19      PT SHORT TERM GOAL #3   Title  Pt will perform stand pivot transfers with rollator x 5 reps with distant supervision    Baseline  03/17/19: met with  supervision (PTA near wheelchair) with RW (does not use rollator), no physical assistance needed, only cues.    Status  Achieved    Target Date  03/20/19      PT SHORT TERM GOAL #4   Title  Pt will ambulate x 115' with LRAD on level, indoor surfaces with distant supervision    Baseline  min guard x 200' with rollator    Time  4    Period  Weeks    Status  Not Met    Target Date  03/20/19      PT SHORT TERM GOAL #5   Title  Pt will improve gait velocity with LRAD to >/= 1.0 ft/sec    Baseline  03/17/19: 0.58 ft/sec, slower that last score of 0.71 ft/sec    Time  4    Period  Weeks    Status  Not Met    Target Date  03/20/19      PT SHORT TERM GOAL #6   Title  Patient will negotiate 4 stairs with 2 rails, step to sequence with supervision    Baseline  Patient needs CGA for safety    Time  4    Period  Weeks  Status   Partially Met    Target Date  03/20/19        PT Long Term Goals - 02/18/19 0827      PT LONG TERM GOAL #1   Title  Patient will report performing HEP atleast 5 days a week with husband's supervision    Baseline  daughter provides supervision/min A    Time  8    Period  Weeks    Status  Revised    Target Date  04/19/19      PT LONG TERM GOAL #2   Title  Pt will ambulate 230' with LRAD over indoor surfaces with supervision and will report ambulating short distances at home with LRAD and husband's supervision    Baseline  --    Time  8    Period  Weeks    Status  Revised    Target Date  04/19/19      PT LONG TERM GOAL #3   Title  Patient will increase BERG score to at least 34/56 in order to decrease risk of future falls.    Baseline  --    Time  8    Period  Weeks    Status  Revised    Target Date  04/19/19      PT LONG TERM GOAL #4   Title  Pt will perform sit <> stand and stand pivots with LRAD MOD I    Baseline  --    Time  8    Period  Weeks    Status  Revised    Target Date  04/19/19      PT LONG TERM GOAL #5   Title  Patient will increase gait speed to at least 1.8t/sec with LRAD in order to decrease risk of falls and improve household ambulation.    Baseline  --    Time  8    Period  Weeks    Status  Revised    Target Date  04/19/19      PT LONG TERM GOAL #6   Title  Pt will ambulate x 100' outside over pavement with LRAD and supervision-min A    Baseline  --    Time  8    Period  Weeks    Status  Revised    Target Date  04/19/19            Plan - 03/24/19 1205    Clinical Impression Statement  Pt arrived late for session today but able to continue discussion with pt and daughter regarding plan for future visits and the needs for increasing activity at home for carry over and for continued progress.  Due to slight decline from illness and decreased activity will continue with 2x/week through October and will continue to focus on increasing  compliance and independence with mobility at home and progressing ambulation distances.  Will begin with small list of activities for pt to perform in standing and will add to that list as pt gains confidence and independence.  Simulated 3 activities today; will perform again tomorrow and video so that husband can see where pt is functionally.  Pt in agreement.    PT Treatment/Interventions  ADLs/Self Care Home Management;Gait training;DME Instruction;Stair training;Functional mobility training;Therapeutic activities;Patient/family education;Neuromuscular re-education;Balance training;Therapeutic exercise;Passive range of motion;Orthotic Fit/Training    PT Next Visit Plan  perform activities in standing and video for husband to see.  Work on walking from chair to chair with supervision - video.  Work on  turns.  set up [employer] for L LE for quad strength?  (may not be beneficial unless patient willing to improve functional carryover of gait), LE strengthening, functional mobility.    PT Home Exercise Plan  [id number]    Consulted and Agree with Plan of Care  Patient;Family member/caregiver    Family Member Consulted  Daughter, Wyatt Portela at end of session       Patient will benefit from skilled therapeutic intervention in order to improve the following deficits and impairments:  Abnormal gait, Decreased activity tolerance, Decreased balance, Decreased range of motion, Decreased mobility, Decreased endurance, Decreased skin integrity, Decreased strength, Difficulty walking, Impaired perceived functional ability, Postural dysfunction  Visit Diagnosis: Unsteadiness on feet  Other abnormalities of gait and mobility  Muscle weakness (generalized)  Hemiplegia and hemiparesis following cerebral infarction affecting left non-dominant side [facility])     Problem List Patient Active Problem List   Diagnosis Date Noted  . Iron deficiency anemia 02/03/2019  . Pelvic fracture ([place]) 09/21/2018  . Allergic reaction  caused by a drug 05/06/2018  . Bilateral leg edema 04/13/2018  . HLD (hyperlipidemia) 03/23/2018  . Mood swings 03/23/2018  . Chronic kidney disease (CKD), stage III (moderate) (HCC)   . Embolic stroke ([place]) 81/08/5484  . Left hemiparesis ([place])   . Diabetes mellitus type 2 in nonobese (HCC)   . Acute ischemic stroke ([place])   . Left arm weakness   . Acute CVA (cerebrovascular accident) ([place]) 02/24/2018  . Overweight (BMI 25.0-29.9) 02/24/2018  . CKD (chronic kidney disease), stage III ([place]) 02/24/2018  . Diabetes mellitus without complication ([place]) 28/24/1753  . HTN (hypertension) 09/02/2017    Rico Junker, PT, DPT 03/24/19    12:12 PM    [place] [address] [place], [place], [address] Phone: [phone]   Fax:  [phone]  Name: [name] MRN: [id number] Date of Birth: Apr 13, 1946

## 2019-03-24 NOTE — Patient Instructions (Signed)
Activities to perform in standing EVERY DAY:  Standing at the sink in bathroom:  1) Wash face  2) Brush hair  3) Brush Teeth   Walking TBD

## 2019-03-25 ENCOUNTER — Ambulatory Visit: Payer: Medicare Other | Admitting: Physical Therapy

## 2019-03-25 ENCOUNTER — Ambulatory Visit: Payer: Medicare Other | Admitting: Occupational Therapy

## 2019-03-25 ENCOUNTER — Encounter: Payer: Self-pay | Admitting: Physical Therapy

## 2019-03-25 DIAGNOSIS — R278 Other lack of coordination: Secondary | ICD-10-CM

## 2019-03-25 DIAGNOSIS — I69354 Hemiplegia and hemiparesis following cerebral infarction affecting left non-dominant side: Secondary | ICD-10-CM

## 2019-03-25 DIAGNOSIS — R2681 Unsteadiness on feet: Secondary | ICD-10-CM

## 2019-03-25 DIAGNOSIS — M6281 Muscle weakness (generalized): Secondary | ICD-10-CM

## 2019-03-25 DIAGNOSIS — R2689 Other abnormalities of gait and mobility: Secondary | ICD-10-CM

## 2019-03-25 NOTE — Patient Instructions (Signed)
Activities to perform in standing EVERY DAY:  Standing at the sink in bathroom:  1) Wash face  2) Brush hair  3) Brush Teeth   Walking TBD  LTG: Fixing microwave and oven meals

## 2019-03-25 NOTE — Therapy (Signed)
[facility] [address] [place], [place], [address] Phone: [phone]   Fax:  [phone]  Occupational Therapy Treatment  Patient Details  Name: [name] MRN: [id number] Date of Birth: 02/11/1946 Referring Provider (OT): Dr. Letta Pate   Encounter Date: 03/25/2019  OT End of Session - 03/25/19 1107    Visit Number  24    Number of Visits  29    Date for OT Re-Evaluation  04/20/19    Authorization Type  [employer] MCR    Authorization Time Period  cert. period 02/19/19-05/20/19    Authorization - Visit Number  24    Authorization - Number of Visits  30    OT Start Time  1018    OT Stop Time  1101    OT Time Calculation (min)  43 min    Activity Tolerance  Patient tolerated treatment well    Behavior During Therapy  WFL for tasks assessed/performed       Past Medical History:  Diagnosis Date  . Allergy   . Arthritis   . Cataract   . Diabetes mellitus without complication ([place])   . Hypertension   . Macular degeneration syndrome    with edema---being treated.     Past Surgical History:  Procedure Laterality Date  . callous removal  Left 2017   located on 1st digit on L foot 2/2 diabetes  . EYE SURGERY      There were no vitals filed for this visit.  Subjective Assessment - 03/25/19 1025    Subjective   I can get a snack from w/c level    Patient is accompanied by:  Family member   daughter   Pertinent History  CVA 02/27/18, pelvic fx and Lt hip  fx 09/2018. PMH: HTN, OA    Limitations  Fall risk, wounds on feet    Currently in Pain?  No/denies       Pt practiced wheeling into kitchen w/ LE's from wheelchair and getting items out of refrigerator from w/c level, then stood at counter and performed side stepping w/ 2 hand support, got plate and mug out of cabinet and made sandwich all from standing level w/ CGA for safety/fall prevention. When utilizing both hands to make sandwich, pt instructed to lean  hips against counter for balance. Pt also had difficulty using Lt hand as gross assist for tying bread bag back up. Pt fatigued quickly and needed to rest after activity. Pt then stood again and practiced side stepping, reaching w/ both hands as able while standing, and reaching to open lower cabinet.   Seated: worked on Physiological scientist. Progressed to bilateral tasks: folding pillow case and towel w/ cues to use Lt hand as gross assist                      OT Short Term Goals - 03/25/19 1108      OT SHORT TERM GOAL #1   Title  Independent with initial HEP for shoulder ROM, and functional reaching LUE    Time  4    Period  Weeks    Status  Achieved      OT SHORT TERM GOAL #2   Title  Pt to perform UB dressing with set up only and LE dressing with mod assist or less using A/E prn    Time  4    Period  Weeks    Status  Achieved  OT SHORT TERM GOAL #3   Title  Pt to increase LUE functional use as evidenced by performing 15 blocks on Box & Blocks test.--STGs due 03/21/19   (modified 03/19/19)    Baseline  11 blocks    Time  4    Period  Weeks    Status  Revised   01/27/19:  11 blocks, 02/17/19:  13, 03/19/19 10 blocks     OT SHORT TERM GOAL #4   Title  Pt to perform toileting/clothes management with only min assist    Period  Weeks    Status  Achieved   Able to complete - simulation -  in clinic.  01/27/19:  met per pt/dtr report     OT SHORT TERM GOAL #5   Title  Pt to achieve 90* shoulder flexion LUE for mid level reaching    Time  4    Period  Weeks    Status  Not Met   01/18/19:  75* with min compensation.  02/17/19:  85* with min compensation.  03/09/19: inconsistent. 03/25/19: 85* w/ compensations       OT Long Term Goals - 03/25/19 1110      OT LONG TERM GOAL #1   Title  Independent with updated HEP - due 04/20/19    Time  8    Period  Weeks    Status  On-going      OT LONG TERM GOAL #2   Title  Pt to be mod I with all BADLS using A/E as  needed (except shower transfers to still be supervision)    Time  8    Period  Weeks    Status  On-going   02/17/19:  assist for compression stockings, min A for depends, supervision for shower transfer (mod I for bathing), toileting with BSC mod I     OT LONG TERM GOAL #3   Title  Pt to improve LUE function as evidenced by performing 19 blocks or greater on Box & Blocks test--revised 03/19/19    Time  8    Period  Weeks    Status  Revised   02/17/19:  13     OT LONG TERM GOAL #4   Title  Pt to improve coordination as evidenced by performing 9 hole peg test completely in 90 sec or less    Baseline  placed 5 in 2 min.    Time  8    Period  Weeks    Status  On-going   02/17/19:  5 pegs within 64mn.  completed in 436m 43sec     OT LONG TERM GOAL #5   Title  Grip strength Lt hand to be 15 lbs or greater    Baseline  8 lbs    Time  8    Period  Weeks    Status  On-going   02/17/19:  5lbs     OT LONG TERM GOAL #6   Title  Pt to perform simple snack, cold food, and microwaveable food mod I level with DME prn without LOB    Time  8    Period  Weeks    Status  On-going   02/17/19:  not performing. 03/25/19: performed in clinic making sandwich w/ close sup (partly from w/c level, mostly from standing)     OT LONG TERM GOAL #7   Title  Pt to achieve 110* or greater P/ROM Lt shoulder with pain less than or equal to 5/10    Time  8    Period  Weeks    Status  Achieved   02/17/19:  110-115* in sitting with 4-5/10 pain           Plan - 03/25/19 1111    Clinical Impression Statement  Pt progressing with functional mobility for kitchen task but fatigues quickly    Occupational performance deficits (Please refer to evaluation for details):  ADL's;IADL's;Leisure    Body Structure / Function / Physical Skills  ADL;ROM;Balance;IADL;Body mechanics;Improper spinal/pelvic alignment;Mobility;Strength;Flexibility;FMC;Coordination;UE functional use;Proprioception;Decreased knowledge of use of DME     OT Frequency  2x / week    OT Duration  8 weeks    OT Treatment/Interventions  Self-care/ADL training;Therapeutic exercise;Functional Mobility Training;Neuromuscular education;Aquatic Therapy;Manual Therapy;Splinting;Therapeutic activities;Paraffin;DME and/or AE instruction;Cognitive remediation/compensation;Electrical Stimulation;Visual/perceptual remediation/compensation;Moist Heat;Passive range of motion;Patient/family education    Plan  continue functional mobility as able, sh. ROM in supine, and seated for bilateral functional coordination tasks (try tying, stringing)   Consulted and Agree with Plan of Care  Patient;Family member/caregiver    Family Member Consulted  daughter       Patient will benefit from skilled therapeutic intervention in order to improve the following deficits and impairments:   Body Structure / Function / Physical Skills: ADL, ROM, Balance, IADL, Body mechanics, Improper spinal/pelvic alignment, Mobility, Strength, Flexibility, FMC, Coordination, UE functional use, Proprioception, Decreased knowledge of use of DME       Visit Diagnosis: Unsteadiness on feet  Hemiplegia and hemiparesis following cerebral infarction affecting left non-dominant side (HCC)  Other lack of coordination    Problem List Patient Active Problem List   Diagnosis Date Noted  . Iron deficiency anemia 02/03/2019  . Pelvic fracture ([place]) 09/21/2018  . Allergic reaction caused by a drug 05/06/2018  . Bilateral leg edema 04/13/2018  . HLD (hyperlipidemia) 03/23/2018  . Mood swings 03/23/2018  . Chronic kidney disease (CKD), stage III (moderate) (HCC)   . Embolic stroke ([place]) 81/68/3870  . Left hemiparesis ([place])   . Diabetes mellitus type 2 in nonobese (HCC)   . Acute ischemic stroke ([place])   . Left arm weakness   . Acute CVA (cerebrovascular accident) ([place]) 02/24/2018  . Overweight (BMI 25.0-29.9) 02/24/2018  . CKD (chronic kidney disease), stage III ([place]) 02/24/2018  . Diabetes  mellitus without complication ([place]) 65/82/6088  . HTN (hypertension) 09/02/2017    Carey Bullocks, OTR/L 03/25/2019, 11:13 AM  [facility] [address] [place], [place], [address] Phone: [phone]   Fax:  [phone]  Name: [name] MRN: [id number] Date of Birth: [birth year]

## 2019-03-25 NOTE — Therapy (Signed)
[facility] [address] [place], [place], [address] Phone: [phone]   Fax:  [phone]  Physical Therapy Treatment  Patient Details  Name: [name] MRN: [id number] Date of Birth: [birth year] Referring Provider (PT): Alysia Penna, MD   Encounter Date: 03/25/2019  PT End of Session - 03/25/19 1300    Visit Number  34    Number of Visits  40    Date for PT Re-Evaluation  04/19/19    Authorization Type  [employer] Medicare, 10th visit progress note    PT Start Time  0936    PT Stop Time  1015    PT Time Calculation (min)  39 min    Activity Tolerance  Patient tolerated treatment well    Behavior During Therapy  [facility] for tasks assessed/performed       Past Medical History:  Diagnosis Date  . Allergy   . Arthritis   . Cataract   . Diabetes mellitus without complication ([place])   . Hypertension   . Macular degeneration syndrome    with edema---being treated.     Past Surgical History:  Procedure Laterality Date  . callous removal  Left 2017   located on 1st digit on L foot 2/2 diabetes  . EYE SURGERY      There were no vitals filed for this visit.  Subjective Assessment - 03/25/19 0939    Subjective  Went home and discussed activities with husband who said he would back off.  Daughter gets her COVID test back tomorrow.    Pertinent History  Patient had a R CVA 02/2018 and was walking with a SPC prior to a fall in March 2020 resulting in being hospitalized with multiple pelvic fxs and a L hip fx. During hospital stay, patient acquired pressure ulcers on bilateral soles of feet near heels.Patient is now safely able to bear weight and pressure ulcers are now a stage 1 (per daughter report and photos).    Patient Stated Goals  "Be back to normal, get back to walking, and getting strength back"    Currently in Pain?  No/denies                       [facility] Adult PT Treatment/Exercise - 03/25/19  1246      Transfers   Transfers  Sit to Stand;Stand to [employer] Transfers    Sit to Stand  5: Supervision    Stand to Sit  5: Supervision    Stand Pivot Transfers  5: Supervision    Stand Pivot Transfer Details (indicate cue type and reason)  stand pivot simulated transfer w/c <> BSC with RW to simulate performing at home in the bathroom instead of using BSC in the bedroom during the day; would continue to use BSC at night in bedroom      Ambulation/Gait   Ambulation/Gait  Yes    Ambulation/Gait Assistance  5: Supervision;4: Min guard    Ambulation/Gait Assistance Details  Supervision while ambulating x 10' from w/c <> chair with RW with daughter video taping to allow husband to see at home; no assistance required - only required one cue to keep LLE inside RW when pivoting.  When performing longer walk at end of session therapist provided min A to keep RW in middle of path due to veering to L.      Ambulation Distance (Feet)  10 Feet   + 50   Assistive device  Rolling walker  Gait Pattern  Step-to pattern;Decreased step length - left;Decreased stride length;Decreased hip/knee flexion - left;Decreased dorsiflexion - left;Trunk flexed;Poor foot clearance - left;Step-through pattern    Ambulation Surface  Level;Indoor      Therapeutic Activites    Therapeutic Activities  ADL's;Other Therapeutic Activities    ADL's  with daughter video taping pt performing sit > stand at sink and standing at sink for >4 minutes while performing simulated face washing, teeth brushing and hair brushing with one and no UE support with therapist providing supervision behind chair, no support or assistance provided.  Will show to husband at home and will begin to perform these three activities in standing each day.  Pt's goal is to be able to take prepared meal out of oven.             PT Education - 03/25/19 1300    Education Details  see TA    Person(s) Educated  Patient;Child(ren)    Methods   Explanation;Demonstration    Comprehension  Verbalized understanding;Returned demonstration       PT Short Term Goals - 03/19/19 1618      PT SHORT TERM GOAL #1   Title  Pt will demonstrate ability to perform ongoing HEP (stretching and standing exercises) with supervision    Baseline  03/17/19: pt reports consistently doing HEP with daugher, sometimes spouse, assistance    Status  Achieved    Target Date  03/20/19      PT SHORT TERM GOAL #2   Title  Patient will increase BERG score to at least a 30/56 in order to help decrease risk of future falls.    Baseline  03/17/19: 35/56 scored today    Status  Achieved    Target Date  03/20/19      PT SHORT TERM GOAL #3   Title  Pt will perform stand pivot transfers with rollator x 5 reps with distant supervision    Baseline  03/17/19: met with  supervision (PTA near wheelchair) with RW (does not use rollator), no physical assistance needed, only cues.    Status  Achieved    Target Date  03/20/19      PT SHORT TERM GOAL #4   Title  Pt will ambulate x 115' with LRAD on level, indoor surfaces with distant supervision    Baseline  min guard x 200' with rollator    Time  4    Period  Weeks    Status  Not Met    Target Date  03/20/19      PT SHORT TERM GOAL #5   Title  Pt will improve gait velocity with LRAD to >/= 1.0 ft/sec    Baseline  03/17/19: 0.58 ft/sec, slower that last score of 0.71 ft/sec    Time  4    Period  Weeks    Status  Not Met    Target Date  03/20/19      PT SHORT TERM GOAL #6   Title  Patient will negotiate 4 stairs with 2 rails, step to sequence with supervision    Baseline  Patient needs CGA for safety    Time  4    Period  Weeks    Status  Partially Met    Target Date  03/20/19        PT Long Term Goals - 02/18/19 0827      PT LONG TERM GOAL #1   Title  Patient will report performing HEP atleast 5 days a week  with husband's supervision    Baseline  daughter provides supervision/min A    Time  8    Period   Weeks    Status  Revised    Target Date  04/19/19      PT LONG TERM GOAL #2   Title  Pt will ambulate 73' with LRAD over indoor surfaces with supervision and will report ambulating short distances at home with LRAD and husband's supervision    Baseline  --    Time  8    Period  Weeks    Status  Revised    Target Date  04/19/19      PT LONG TERM GOAL #3   Title  Patient will increase BERG score to at least 34/56 in order to decrease risk of future falls.    Baseline  --    Time  8    Period  Weeks    Status  Revised    Target Date  04/19/19      PT LONG TERM GOAL #4   Title  Pt will perform sit <> stand and stand pivots with LRAD MOD I    Baseline  --    Time  8    Period  Weeks    Status  Revised    Target Date  04/19/19      PT LONG TERM GOAL #5   Title  Patient will increase gait speed to at least 1.8t/sec with LRAD in order to decrease risk of falls and improve household ambulation.    Baseline  --    Time  8    Period  Weeks    Status  Revised    Target Date  04/19/19      PT LONG TERM GOAL #6   Title  Pt will ambulate x 100' outside over pavement with LRAD and supervision-min A    Baseline  --    Time  8    Period  Weeks    Status  Revised    Target Date  04/19/19            Plan - 03/25/19 1301    Clinical Impression Statement  Pt continued to demonstrate improved activity tolerance today.  Pt able to perform all three ADL activities in standing with close supervision but no assistance.  Pt also able to ambulate 10 feet chair to chair with RW with close supervision.  Daughter video taped with phone to show husband how pt is performing so that she can begin to increase independence at home.  Also performed w/c <> BSC transfers so that pt can begin to use toilet at home.  Will continue to address and progress activities as appropriate.    PT Treatment/Interventions  ADLs/Self Care Home Management;Gait training;DME Instruction;Stair training;Functional  mobility training;Therapeutic activities;Patient/family education;Neuromuscular re-education;Balance training;Therapeutic exercise;Passive range of motion;Orthotic Fit/Training    PT Next Visit Plan  Work on turns with RW.  set up [place] for L LE for quad strength?  (may not be beneficial unless patient willing to improve functional carryover of gait), LE strengthening, functional mobility.  Patient's goal is to eventually be able to take prepared meal out of microwave or oven.    PT Home Exercise Plan  [id number]    Consulted and Agree with Plan of Care  Patient;Family member/caregiver    Family Member Consulted  Daughter, Wyatt Portela at end of session       Patient will benefit from skilled therapeutic intervention in order to improve  the following deficits and impairments:  Abnormal gait, Decreased activity tolerance, Decreased balance, Decreased range of motion, Decreased mobility, Decreased endurance, Decreased skin integrity, Decreased strength, Difficulty walking, Impaired perceived functional ability, Postural dysfunction  Visit Diagnosis: Unsteadiness on feet  Hemiplegia and hemiparesis following cerebral infarction affecting left non-dominant side (HCC)  Other lack of coordination  Other abnormalities of gait and mobility  Muscle weakness (generalized)     Problem List Patient Active Problem List   Diagnosis Date Noted  . Iron deficiency anemia 02/03/2019  . Pelvic fracture ([place]) 09/21/2018  . Allergic reaction caused by a drug 05/06/2018  . Bilateral leg edema 04/13/2018  . HLD (hyperlipidemia) 03/23/2018  . Mood swings 03/23/2018  . Chronic kidney disease (CKD), stage III (moderate) (HCC)   . Embolic stroke ([place]) 58/30/9407  . Left hemiparesis ([place])   . Diabetes mellitus type 2 in nonobese (HCC)   . Acute ischemic stroke ([place])   . Left arm weakness   . Acute CVA (cerebrovascular accident) ([place]) 02/24/2018  . Overweight (BMI 25.0-29.9) 02/24/2018  . CKD (chronic kidney  disease), stage III ([place]) 02/24/2018  . Diabetes mellitus without complication ([place]) 68/02/8109  . HTN (hypertension) 09/02/2017    Rico Junker, PT, DPT 03/25/19    1:07 PM    [facility] [address] [place], [place], [address] Phone: [phone]   Fax:  [phone]  Name: [name] MRN: [id number] Date of Birth: [birth year]

## 2019-03-29 ENCOUNTER — Ambulatory Visit: Payer: Medicare Other | Admitting: Rehabilitative and Restorative Service Providers"

## 2019-03-29 ENCOUNTER — Ambulatory Visit: Payer: Medicare Other | Admitting: Occupational Therapy

## 2019-03-29 ENCOUNTER — Other Ambulatory Visit: Payer: Self-pay

## 2019-03-29 ENCOUNTER — Encounter: Payer: Self-pay | Admitting: Rehabilitative and Restorative Service Providers"

## 2019-03-29 DIAGNOSIS — M6281 Muscle weakness (generalized): Secondary | ICD-10-CM

## 2019-03-29 DIAGNOSIS — R278 Other lack of coordination: Secondary | ICD-10-CM

## 2019-03-29 DIAGNOSIS — M25612 Stiffness of left shoulder, not elsewhere classified: Secondary | ICD-10-CM

## 2019-03-29 DIAGNOSIS — I69354 Hemiplegia and hemiparesis following cerebral infarction affecting left non-dominant side: Secondary | ICD-10-CM

## 2019-03-29 DIAGNOSIS — R2681 Unsteadiness on feet: Secondary | ICD-10-CM

## 2019-03-29 DIAGNOSIS — R2689 Other abnormalities of gait and mobility: Secondary | ICD-10-CM

## 2019-03-29 NOTE — Therapy (Signed)
[facility] [address] [place], [place], [address] Phone: [phone]   Fax:  [phone]  Occupational Therapy Treatment  Patient Details  Name: [name] MRN: [id number] Date of Birth: 1946/05/02 Referring Provider (OT): Dr. Letta Pate   Encounter Date: 03/29/2019  OT End of Session - 03/29/19 0906    Visit Number  25    Number of Visits  32    Date for OT Re-Evaluation  04/20/19    Authorization Type  [employer] MCR    Authorization Time Period  cert. period 02/19/19-05/20/19    Authorization - Visit Number  25    Authorization - Number of Visits  30    OT Start Time  0803    OT Stop Time  0848    OT Time Calculation (min)  45 min    Activity Tolerance  Patient tolerated treatment well    Behavior During Therapy  [facility] for tasks assessed/performed       Past Medical History:  Diagnosis Date  . Allergy   . Arthritis   . Cataract   . Diabetes mellitus without complication ([place])   . Hypertension   . Macular degeneration syndrome    with edema---being treated.     Past Surgical History:  Procedure Laterality Date  . callous removal  Left 2017   located on 1st digit on L foot 2/2 diabetes  . EYE SURGERY      There were no vitals filed for this visit.  Subjective Assessment - 03/29/19 0808    Subjective   Daughter reports pt getting much better.    Patient is accompanied by:  Family member   daughter   Pertinent History  CVA 02/27/18, pelvic fx and Lt hip  fx 09/2018. PMH: HTN, OA    Currently in Pain?  No/denies        Pt transferred from w/c to mat w/ only min assist. Sit to supine w/ mod assist. Light joint mobs to Lt shoulder followed by P/ROM to Lt shoulder in flexion and scaption ranges.  Seated: Simulated tying shoes w/ laces at lap level w/ min assist and cues to use Lt hand as gross assist. Pt then stringing large wooden beads to force Lt hand to serve as assist for bilateral coordination  tasks.                        OT Short Term Goals - 03/25/19 1108      OT SHORT TERM GOAL #1   Title  Independent with initial HEP for shoulder ROM, and functional reaching LUE    Time  4    Period  Weeks    Status  Achieved      OT SHORT TERM GOAL #2   Title  Pt to perform UB dressing with set up only and LE dressing with mod assist or less using A/E prn    Time  4    Period  Weeks    Status  Achieved      OT SHORT TERM GOAL #3   Title  Pt to increase LUE functional use as evidenced by performing 15 blocks on Box & Blocks test.--STGs due 03/21/19   (modified 03/19/19)    Baseline  11 blocks    Time  4    Period  Weeks    Status  Revised   01/27/19:  11 blocks, 02/17/19:  13, 03/19/19 10 blocks     OT  SHORT TERM GOAL #4   Title  Pt to perform toileting/clothes management with only min assist    Period  Weeks    Status  Achieved   Able to complete - simulation -  in clinic.  01/27/19:  met per pt/dtr report     OT SHORT TERM GOAL #5   Title  Pt to achieve 90* shoulder flexion LUE for mid level reaching    Time  4    Period  Weeks    Status  Not Met   01/18/19:  75* with min compensation.  02/17/19:  85* with min compensation.  03/09/19: inconsistent. 03/25/19: 85* w/ compensations       OT Long Term Goals - 03/25/19 1110      OT LONG TERM GOAL #1   Title  Independent with updated HEP - due 04/20/19    Time  8    Period  Weeks    Status  On-going      OT LONG TERM GOAL #2   Title  Pt to be mod I with all BADLS using A/E as needed (except shower transfers to still be supervision)    Time  8    Period  Weeks    Status  On-going   02/17/19:  assist for compression stockings, min A for depends, supervision for shower transfer (mod I for bathing), toileting with BSC mod I     OT LONG TERM GOAL #3   Title  Pt to improve LUE function as evidenced by performing 19 blocks or greater on Box & Blocks test--revised 03/19/19    Time  8    Period  Weeks    Status   Revised   02/17/19:  13     OT LONG TERM GOAL #4   Title  Pt to improve coordination as evidenced by performing 9 hole peg test completely in 90 sec or less    Baseline  placed 5 in 2 min.    Time  8    Period  Weeks    Status  On-going   02/17/19:  5 pegs within 55mn.  completed in 445m 43sec     OT LONG TERM GOAL #5   Title  Grip strength Lt hand to be 15 lbs or greater    Baseline  8 lbs    Time  8    Period  Weeks    Status  On-going   02/17/19:  5lbs     OT LONG TERM GOAL #6   Title  Pt to perform simple snack, cold food, and microwaveable food mod I level with DME prn without LOB    Time  8    Period  Weeks    Status  On-going   02/17/19:  not performing. 03/25/19: performed in clinic making sandwich w/ close sup (partly from w/c level, mostly from standing)     OT LONG TERM GOAL #7   Title  Pt to achieve 110* or greater P/ROM Lt shoulder with pain less than or equal to 5/10    Time  8    Period  Weeks    Status  Achieved   02/17/19:  110-115* in sitting with 4-5/10 pain           Plan - 03/29/19 0908    Clinical Impression Statement  Pt progressing with sitting balance. Pt also progressing with using LUE as gross assist for bilateral tasks.    Occupational performance deficits (Please refer to evaluation for details):  ADL's;IADL's;Leisure    Body Structure / Function / Physical Skills  ADL;ROM;Balance;IADL;Body mechanics;Improper spinal/pelvic alignment;Mobility;Strength;Flexibility;FMC;Coordination;UE functional use;Proprioception;Decreased knowledge of use of DME    Rehab Potential  Good    Comorbidities impacting occupational performance description:  Wounds bilateral feet, Healed recent hip and pelvic fx's    OT Frequency  2x / week    OT Duration  8 weeks    OT Treatment/Interventions  Self-care/ADL training;Therapeutic exercise;Functional Mobility Training;Neuromuscular education;Aquatic Therapy;Manual Therapy;Splinting;Therapeutic activities;Paraffin;DME  and/or AE instruction;Cognitive remediation/compensation;Electrical Stimulation;Visual/perceptual remediation/compensation;Moist Heat;Passive range of motion;Patient/family education    Plan  continue functional mobility and functional coordination tasks    Consulted and Agree with Plan of Care  Patient;Family member/caregiver    Family Member Consulted  daughter       Patient will benefit from skilled therapeutic intervention in order to improve the following deficits and impairments:   Body Structure / Function / Physical Skills: ADL, ROM, Balance, IADL, Body mechanics, Improper spinal/pelvic alignment, Mobility, Strength, Flexibility, FMC, Coordination, UE functional use, Proprioception, Decreased knowledge of use of DME       Visit Diagnosis: Hemiplegia and hemiparesis following cerebral infarction affecting left non-dominant side (HCC)  Stiffness of left shoulder, not elsewhere classified  Other lack of coordination    Problem List Patient Active Problem List   Diagnosis Date Noted  . Iron deficiency anemia 02/03/2019  . Pelvic fracture ([place]) 09/21/2018  . Allergic reaction caused by a drug 05/06/2018  . Bilateral leg edema 04/13/2018  . HLD (hyperlipidemia) 03/23/2018  . Mood swings 03/23/2018  . Chronic kidney disease (CKD), stage III (moderate) (HCC)   . Embolic stroke ([place]) 54/36/0677  . Left hemiparesis ([place])   . Diabetes mellitus type 2 in nonobese (HCC)   . Acute ischemic stroke ([place])   . Left arm weakness   . Acute CVA (cerebrovascular accident) ([place]) 02/24/2018  . Overweight (BMI 25.0-29.9) 02/24/2018  . CKD (chronic kidney disease), stage III ([place]) 02/24/2018  . Diabetes mellitus without complication ([place]) 03/40/3524  . HTN (hypertension) 09/02/2017    Carey Bullocks, OTR/L 03/29/2019, 9:10 AM  [facility] [address] [place], [place], [address] Phone: [phone]   Fax:   [phone]  Name: [name] MRN: [id number] Date of Birth: 05/02/1946

## 2019-03-29 NOTE — Therapy (Signed)
[facility] [address] [place], [place], [address] Phone: [phone]   Fax:  [phone]  Physical Therapy Treatment  Patient Details  Name: [name] MRN: [id number] Date of Birth: 12-Dec-1945 Referring Provider (PT): Alysia Penna, MD   Encounter Date: 03/29/2019  PT End of Session - 03/29/19 1216    Visit Number  35    Number of Visits  40    Date for PT Re-Evaluation  04/19/19    Authorization Type  [employer] Medicare, 10th visit progress note    PT Start Time  0849    PT Stop Time  0930    PT Time Calculation (min)  41 min    Activity Tolerance  Patient tolerated treatment well    Behavior During Therapy  [facility] for tasks assessed/performed       Past Medical History:  Diagnosis Date  . Allergy   . Arthritis   . Cataract   . Diabetes mellitus without complication ([place])   . Hypertension   . Macular degeneration syndrome    with edema---being treated.     Past Surgical History:  Procedure Laterality Date  . callous removal  Left 2017   located on 1st digit on L foot 2/2 diabetes  . EYE SURGERY      There were no vitals filed for this visit.  Subjective Assessment - 03/29/19 0852    Subjective  The patient reports she is standing up to the countertop more often and is doing her morning ADLs (toothbrush, face wash, hair brushing).    Pertinent History  Patient had a R CVA 02/2018 and was walking with a SPC prior to a fall in March 2020 resulting in being hospitalized with multiple pelvic fxs and a L hip fx. During hospital stay, patient acquired pressure ulcers on bilateral soles of feet near heels.Patient is now safely able to bear weight and pressure ulcers are now a stage 1 (per daughter report and photos).    Patient Stated Goals  "Be back to normal, get back to walking, and getting strength back"    Currently in Pain?  No/denies         [facility] PT Assessment - 03/29/19 0902       Ambulation/Gait   Ambulation/Gait Assistance Details  Close supervision navigating cones, turning, and walking short distances.     Ambulation Distance (Feet)  50 Feet    x 2 reps; 40 feet walking towards lobby                  [facility] Adult PT Treatment/Exercise - 03/29/19 0902      Transfers   Transfers  Sit to Stand;Stand to Sit    Sit to Stand  5: Supervision;4: Min guard    Sit to Stand Details  Verbal cues for sequencing;Manual facilitation for weight shifting    Stand to Sit  5: Supervision    Comments  x 5 reps with cues on scooting to edge of mat and standing tall.  Patient able to perform with supervision after demonstration of improved technique.      Ambulation/Gait   Ambulation/Gait  Yes    Ambulation/Gait Assistance  5: Supervision;4: Min guard    Assistive device  Rolling walker    Gait Pattern  Step-to pattern;Decreased step length - left;Decreased stride length;Decreased hip/knee flexion - left;Decreased dorsiflexion - left;Trunk flexed;Poor foot clearance - left;Step-through pattern    Ambulation Surface  Level;Indoor    Gait Comments  Walking  20 feet navigating 4 cones to work on turning.      Neuro Re-ed    Neuro Re-ed Details   The patient performed seated postural elongation, lateral R elbow prop with L hip hike/depression, seated lateral trunk leaning.      Exercises   Exercises  Other Exercises    Other Exercises   seated hamstring stretch L side, seated long arc quads, standing to mini squat x 5 reps with UE support and CGA using walker.               PT Short Term Goals - 03/19/19 1618      PT SHORT TERM GOAL #1   Title  Pt will demonstrate ability to perform ongoing HEP (stretching and standing exercises) with supervision    Baseline  03/17/19: pt reports consistently doing HEP with daugher, sometimes spouse, assistance    Status  Achieved    Target Date  03/20/19      PT SHORT TERM GOAL #2   Title  Patient will increase BERG score  to at least a 30/56 in order to help decrease risk of future falls.    Baseline  03/17/19: 35/56 scored today    Status  Achieved    Target Date  03/20/19      PT SHORT TERM GOAL #3   Title  Pt will perform stand pivot transfers with rollator x 5 reps with distant supervision    Baseline  03/17/19: met with  supervision (PTA near wheelchair) with RW (does not use rollator), no physical assistance needed, only cues.    Status  Achieved    Target Date  03/20/19      PT SHORT TERM GOAL #4   Title  Pt will ambulate x 115' with LRAD on level, indoor surfaces with distant supervision    Baseline  min guard x 200' with rollator    Time  4    Period  Weeks    Status  Not Met    Target Date  03/20/19      PT SHORT TERM GOAL #5   Title  Pt will improve gait velocity with LRAD to >/= 1.0 ft/sec    Baseline  03/17/19: 0.58 ft/sec, slower that last score of 0.71 ft/sec    Time  4    Period  Weeks    Status  Not Met    Target Date  03/20/19      PT SHORT TERM GOAL #6   Title  Patient will negotiate 4 stairs with 2 rails, step to sequence with supervision    Baseline  Patient needs CGA for safety    Time  4    Period  Weeks    Status  Partially Met    Target Date  03/20/19        PT Long Term Goals - 02/18/19 0827      PT LONG TERM GOAL #1   Title  Patient will report performing HEP atleast 5 days a week with husband's supervision    Baseline  daughter provides supervision/min A    Time  8    Period  Weeks    Status  Revised    Target Date  04/19/19      PT LONG TERM GOAL #2   Title  Pt will ambulate 230' with LRAD over indoor surfaces with supervision and will report ambulating short distances at home with LRAD and husband's supervision    Baseline  --  Time  8    Period  Weeks    Status  Revised    Target Date  04/19/19      PT LONG TERM GOAL #3   Title  Patient will increase BERG score to at least 34/56 in order to decrease risk of future falls.    Baseline  --    Time   8    Period  Weeks    Status  Revised    Target Date  04/19/19      PT LONG TERM GOAL #4   Title  Pt will perform sit <> stand and stand pivots with LRAD MOD I    Baseline  --    Time  8    Period  Weeks    Status  Revised    Target Date  04/19/19      PT LONG TERM GOAL #5   Title  Patient will increase gait speed to at least 1.8t/sec with LRAD in order to decrease risk of falls and improve household ambulation.    Baseline  --    Time  8    Period  Weeks    Status  Revised    Target Date  04/19/19      PT LONG TERM GOAL #6   Title  Pt will ambulate x 100' outside over pavement with LRAD and supervision-min A    Baseline  --    Time  8    Period  Weeks    Status  Revised    Target Date  04/19/19            Plan - 03/29/19 1233    Clinical Impression Statement  The patient was able to negotiate obstacles in therapy today with RW and close supervision.  She fatigued with walking and we worked on not having w/c behind her and planning ahead to make it to a chair.  Patient felt fatigued, however she was still able to walk 40 feet towards lobby with PT encouraging her to push as far as she feels safe to do.  She is also standing more for ADLs in the home.    Comorbidities  Hypertension, Diabetes, Chronic kidney disease    PT Treatment/Interventions  ADLs/Self Care Home Management;Gait training;DME Instruction;Stair training;Functional mobility training;Therapeutic activities;Patient/family education;Neuromuscular re-education;Balance training;Therapeutic exercise;Passive range of motion;Orthotic Fit/Training    PT Next Visit Plan  Work on turns with RW.  set up [place] for L LE for quad strength?  (may not be beneficial unless patient willing to improve functional carryover of gait), LE strengthening, functional mobility.  Patient's goal is to eventually be able to take prepared meal out of microwave or oven.    PT Home Exercise Plan  [id number]    Consulted and Agree with Plan of  Care  Patient;Family member/caregiver    Family Member Consulted  daughter, [name]       Patient will benefit from skilled therapeutic intervention in order to improve the following deficits and impairments:  Abnormal gait, Decreased activity tolerance, Decreased balance, Decreased range of motion, Decreased mobility, Decreased endurance, Decreased skin integrity, Decreased strength, Difficulty walking, Impaired perceived functional ability, Postural dysfunction  Visit Diagnosis: Hemiplegia and hemiparesis following cerebral infarction affecting left non-dominant side (HCC)  Unsteadiness on feet  Other abnormalities of gait and mobility  Muscle weakness (generalized)     Problem List Patient Active Problem List   Diagnosis Date Noted  . Iron deficiency anemia 02/03/2019  . Pelvic fracture ([place]) 09/21/2018  .  Allergic reaction caused by a drug 05/06/2018  . Bilateral leg edema 04/13/2018  . HLD (hyperlipidemia) 03/23/2018  . Mood swings 03/23/2018  . Chronic kidney disease (CKD), stage III (moderate) (HCC)   . Embolic stroke ([place]) 96/94/0982  . Left hemiparesis ([place])   . Diabetes mellitus type 2 in nonobese (HCC)   . Acute ischemic stroke ([place])   . Left arm weakness   . Acute CVA (cerebrovascular accident) ([place]) 02/24/2018  . Overweight (BMI 25.0-29.9) 02/24/2018  . CKD (chronic kidney disease), stage III ([place]) 02/24/2018  . Diabetes mellitus without complication ([place]) 86/75/1982  . HTN (hypertension) 09/02/2017    Jaleena Viviani, PT 03/29/2019, 12:46 PM  [facility] [address] [place], [place], [address] Phone: [phone]   Fax:  [phone]  Name: [name] MRN: [id number] Date of Birth: 1945-10-03

## 2019-04-01 ENCOUNTER — Other Ambulatory Visit: Payer: Self-pay

## 2019-04-01 ENCOUNTER — Ambulatory Visit: Payer: Medicare Other | Admitting: Occupational Therapy

## 2019-04-01 ENCOUNTER — Encounter: Payer: Self-pay | Admitting: Physical Therapy

## 2019-04-01 ENCOUNTER — Ambulatory Visit: Payer: Medicare Other | Admitting: Physical Therapy

## 2019-04-01 DIAGNOSIS — R2689 Other abnormalities of gait and mobility: Secondary | ICD-10-CM

## 2019-04-01 DIAGNOSIS — R2681 Unsteadiness on feet: Secondary | ICD-10-CM

## 2019-04-01 DIAGNOSIS — I69354 Hemiplegia and hemiparesis following cerebral infarction affecting left non-dominant side: Secondary | ICD-10-CM

## 2019-04-01 DIAGNOSIS — R278 Other lack of coordination: Secondary | ICD-10-CM

## 2019-04-01 DIAGNOSIS — M6281 Muscle weakness (generalized): Secondary | ICD-10-CM

## 2019-04-01 NOTE — Therapy (Signed)
[facility] [address] [place], [place], [address] Phone: [phone]   Fax:  [phone]  Occupational Therapy Treatment  Patient Details  Name: [name] MRN: [id number] Date of Birth: [birth year] Referring Provider (OT): Dr. Letta Pate   Encounter Date: 04/01/2019  OT End of Session - 04/01/19 1031    Visit Number  26    Number of Visits  32    Date for OT Re-Evaluation  04/20/19    Authorization Type  [employer] MCR    Authorization Time Period  cert. period 02/19/19-05/20/19    Authorization - Visit Number  26    Authorization - Number of Visits  30    OT Start Time  1018    OT Stop Time  1100    OT Time Calculation (min)  42 min    Activity Tolerance  Patient tolerated treatment well    Behavior During Therapy  WFL for tasks assessed/performed       Past Medical History:  Diagnosis Date  . Allergy   . Arthritis   . Cataract   . Diabetes mellitus without complication ([place])   . Hypertension   . Macular degeneration syndrome    with edema---being treated.     Past Surgical History:  Procedure Laterality Date  . callous removal  Left 2017   located on 1st digit on L foot 2/2 diabetes  . EYE SURGERY      There were no vitals filed for this visit.  Subjective Assessment - 04/01/19 1023    Subjective   I'm tired    Patient is accompanied by:  Family member   daughter   Pertinent History  CVA 02/27/18, pelvic fx and Lt hip  fx 09/2018. PMH: HTN, OA    Limitations  Fall risk, wounds on feet    Currently in Pain?  No/denies      Pt wheeled into kitchen from w/c and practiced standing, side stepping bilaterally along counter, and retrieving plate from cabinet. Pt retrieved items from refrigerator at w/c level. Pt practiced untwisting bread tie and replacing w/ bilateral hands at seated level. Discussed walker negotiation in kitchen for safety/fall prevention w/ pt/daughter.  Pt using Lt hand/UE to pick  up checkers and place into Connect 4 slots w/ min difficulty. Pt/daughter liked this activity and daughter reports she will order Connect 4 for home use                        OT Short Term Goals - 03/25/19 1108      OT SHORT TERM GOAL #1   Title  Independent with initial HEP for shoulder ROM, and functional reaching LUE    Time  4    Period  Weeks    Status  Achieved      OT SHORT TERM GOAL #2   Title  Pt to perform UB dressing with set up only and LE dressing with mod assist or less using A/E prn    Time  4    Period  Weeks    Status  Achieved      OT SHORT TERM GOAL #3   Title  Pt to increase LUE functional use as evidenced by performing 15 blocks on Box & Blocks test.--STGs due 03/21/19   (modified 03/19/19)    Baseline  11 blocks    Time  4    Period  Weeks    Status  Revised   01/27/19:  11 blocks, 02/17/19:  13, 03/19/19 10 blocks     OT SHORT TERM GOAL #4   Title  Pt to perform toileting/clothes management with only min assist    Period  Weeks    Status  Achieved   Able to complete - simulation -  in clinic.  01/27/19:  met per pt/dtr report     OT SHORT TERM GOAL #5   Title  Pt to achieve 90* shoulder flexion LUE for mid level reaching    Time  4    Period  Weeks    Status  Not Met   01/18/19:  75* with min compensation.  02/17/19:  85* with min compensation.  03/09/19: inconsistent. 03/25/19: 85* w/ compensations       OT Long Term Goals - 03/25/19 1110      OT LONG TERM GOAL #1   Title  Independent with updated HEP - due 04/20/19    Time  8    Period  Weeks    Status  On-going      OT LONG TERM GOAL #2   Title  Pt to be mod I with all BADLS using A/E as needed (except shower transfers to still be supervision)    Time  8    Period  Weeks    Status  On-going   02/17/19:  assist for compression stockings, min A for depends, supervision for shower transfer (mod I for bathing), toileting with BSC mod I     OT LONG TERM GOAL #3   Title  Pt to  improve LUE function as evidenced by performing 19 blocks or greater on Box & Blocks test--revised 03/19/19    Time  8    Period  Weeks    Status  Revised   02/17/19:  13     OT LONG TERM GOAL #4   Title  Pt to improve coordination as evidenced by performing 9 hole peg test completely in 90 sec or less    Baseline  placed 5 in 2 min.    Time  8    Period  Weeks    Status  On-going   02/17/19:  5 pegs within 33mn.  completed in 419m 43sec     OT LONG TERM GOAL #5   Title  Grip strength Lt hand to be 15 lbs or greater    Baseline  8 lbs    Time  8    Period  Weeks    Status  On-going   02/17/19:  5lbs     OT LONG TERM GOAL #6   Title  Pt to perform simple snack, cold food, and microwaveable food mod I level with DME prn without LOB    Time  8    Period  Weeks    Status  On-going   02/17/19:  not performing. 03/25/19: performed in clinic making sandwich w/ close sup (partly from w/c level, mostly from standing)     OT LONG TERM GOAL #7   Title  Pt to achieve 110* or greater P/ROM Lt shoulder with pain less than or equal to 5/10    Time  8    Period  Weeks    Status  Achieved   02/17/19:  110-115* in sitting with 4-5/10 pain           Plan - 04/01/19 1105    Clinical Impression Statement  Pt progressing with sitting and standing balance. Pt w/ increased independence w/ functional mobility  Occupational performance deficits (Please refer to evaluation for details):  ADL's;IADL's;Leisure    Body Structure / Function / Physical Skills  ADL;ROM;Balance;IADL;Body mechanics;Improper spinal/pelvic alignment;Mobility;Strength;Flexibility;FMC;Coordination;UE functional use;Proprioception;Decreased knowledge of use of DME    Rehab Potential  Good    OT Frequency  2x / week    OT Duration  8 weeks    OT Treatment/Interventions  Self-care/ADL training;Therapeutic exercise;Functional Mobility Training;Neuromuscular education;Aquatic Therapy;Manual Therapy;Splinting;Therapeutic  activities;Paraffin;DME and/or AE instruction;Cognitive remediation/compensation;Electrical Stimulation;Visual/perceptual remediation/compensation;Moist Heat;Passive range of motion;Patient/family education    Plan  practice walking to bathroom from door and performing clothes management and toileting    Consulted and Agree with Plan of Care  Patient;Family member/caregiver    Family Member Consulted  daughter       Patient will benefit from skilled therapeutic intervention in order to improve the following deficits and impairments:   Body Structure / Function / Physical Skills: ADL, ROM, Balance, IADL, Body mechanics, Improper spinal/pelvic alignment, Mobility, Strength, Flexibility, FMC, Coordination, UE functional use, Proprioception, Decreased knowledge of use of DME       Visit Diagnosis: Hemiplegia and hemiparesis following cerebral infarction affecting left non-dominant side (HCC)  Unsteadiness on feet  Other lack of coordination    Problem List Patient Active Problem List   Diagnosis Date Noted  . Iron deficiency anemia 02/03/2019  . Pelvic fracture (HCC) 09/21/2018  . Allergic reaction caused by a drug 05/06/2018  . Bilateral leg edema 04/13/2018  . HLD (hyperlipidemia) 03/23/2018  . Mood swings 03/23/2018  . Chronic kidney disease (CKD), stage III (moderate) (HCC)   . Embolic stroke (HCC) 02/27/2018  . Left hemiparesis (HCC)   . Diabetes mellitus type 2 in nonobese (HCC)   . Acute ischemic stroke (HCC)   . Left arm weakness   . Acute CVA (cerebrovascular accident) (HCC) 02/24/2018  . Overweight (BMI 25.0-29.9) 02/24/2018  . CKD (chronic kidney disease), stage III (HCC) 02/24/2018  . Diabetes mellitus without complication (HCC) 09/02/2017  . HTN (hypertension) 09/02/2017    Ballie, Kelly Johnson, OTR/L 04/01/2019, 12:05 PM  [facility] [address] [place], [place], [address] Phone: [phone]    Fax:  [phone]  Name: [name] MRN: [id number] Date of Birth: 09/08/1945 

## 2019-04-01 NOTE — Therapy (Signed)
[facility] [address] [place], [place], [address] Phone: [phone]   Fax:  [phone]  Physical Therapy Treatment  Patient Details  Name: [name] MRN: [id number] Date of Birth: 06-28-1946 Referring Provider (PT): Alysia Penna, MD   Encounter Date: 04/01/2019  PT End of Session - 04/01/19 1339    Visit Number  36    Number of Visits  40    Date for PT Re-Evaluation  04/19/19    Authorization Type  [employer] Medicare, 10th visit progress note    PT Start Time  0940    PT Stop Time  1020    PT Time Calculation (min)  40 min    Activity Tolerance  Patient limited by fatigue    Behavior During Therapy  [facility] for tasks assessed/performed       Past Medical History:  Diagnosis Date  . Allergy   . Arthritis   . Cataract   . Diabetes mellitus without complication ([place])   . Hypertension   . Macular degeneration syndrome    with edema---being treated.     Past Surgical History:  Procedure Laterality Date  . callous removal  Left 2017   located on 1st digit on L foot 2/2 diabetes  . EYE SURGERY      There were no vitals filed for this visit.  Subjective Assessment - 04/01/19 0945    Subjective  Pt has new glasses.  Youngest daughter is back from trip and quarantine.  Pt continues to stand at the sink to perform ADL without difficulty and walk 10' from chair to chair.  Is feeling a little more fatigued today due to BP  medication.    Pertinent History  Patient had a R CVA 02/2018 and was walking with a SPC prior to a fall in March 2020 resulting in being hospitalized with multiple pelvic fxs and a L hip fx. During hospital stay, patient acquired pressure ulcers on bilateral soles of feet near heels.Patient is now safely able to bear weight and pressure ulcers are now a stage 1 (per daughter report and photos).    Patient Stated Goals  "Be back to normal, get back to walking, and getting strength back"     Currently in Pain?  No/denies                       [facility] Adult PT Treatment/Exercise - 04/01/19 1357      Bed Mobility   Bed Mobility  Right Sidelying to Sit;Left Sidelying to Sit;Sit to Sidelying Right;Sit to Sidelying Left    Right Sidelying to Sit  Contact Guard/Touching assist    Left Sidelying to Sit  Minimal Assistance - Patient >75%    Sit to Sidelying Right  Minimal Assistance - Patient > 75%    Sit to Sidelying Left  Minimal Assistance - Patient > 75%      Transfers   Transfers  Sit to Stand;Stand to Sit;Stand Pivot Transfers    Sit to Stand  4: Min guard    Sit to Stand Details (indicate cue type and reason)  cue to keep COG moving forwards over BOS    Stand to Sit  5: Supervision    Stand Pivot Transfers  5: Supervision    Stand Pivot Transfer Details (indicate cue type and reason)  cues to fully pivot L foot      Ambulation/Gait   Ambulation/Gait  Yes    Ambulation/Gait Assistance  5:  Supervision    Ambulation Distance (Feet)  25 Feet    Assistive device  Rolling walker    Gait Pattern  Step-through pattern;Trunk flexed    Ambulation Surface  Level;Indoor      Therapeutic Activites    Therapeutic Activities  Other Therapeutic Activities    ADL's  Performed standing balance activity of taking 8 items from countertop and placing them on top shelf of lower cabinet and then returning them to counter top with RUE, LUE stabilizing on counter to simulate placing meal into and taking it out of oven.  Pt performed with supervision      Exercises   Exercises  Other Exercises    Other Exercises   Due to pt being more fatigued today and has not been able to perform stretching at home recently, reviewed stretches prescribed for home.  In side sitting on R and L side: cued pt to depress pelvis while bringing UE overhead while therapist provided shoulder stabilization and facilitation for trunk elongation x 60 seconds each side.  Transitioned to sidelying where  therapist assisted with gradual hip flexor stretch x2 x 60 seconds each side and then adding in isometric hip extension activation and isometric hip ABD activation.               PT Short Term Goals - 03/19/19 1618      PT SHORT TERM GOAL #1   Title  Pt will demonstrate ability to perform ongoing HEP (stretching and standing exercises) with supervision    Baseline  03/17/19: pt reports consistently doing HEP with daugher, sometimes spouse, assistance    Status  Achieved    Target Date  03/20/19      PT SHORT TERM GOAL #2   Title  Patient will increase BERG score to at least a 30/56 in order to help decrease risk of future falls.    Baseline  03/17/19: 35/56 scored today    Status  Achieved    Target Date  03/20/19      PT SHORT TERM GOAL #3   Title  Pt will perform stand pivot transfers with rollator x 5 reps with distant supervision    Baseline  03/17/19: met with  supervision (PTA near wheelchair) with RW (does not use rollator), no physical assistance needed, only cues.    Status  Achieved    Target Date  03/20/19      PT SHORT TERM GOAL #4   Title  Pt will ambulate x 115' with LRAD on level, indoor surfaces with distant supervision    Baseline  min guard x 200' with rollator    Time  4    Period  Weeks    Status  Not Met    Target Date  03/20/19      PT SHORT TERM GOAL #5   Title  Pt will improve gait velocity with LRAD to >/= 1.0 ft/sec    Baseline  03/17/19: 0.58 ft/sec, slower that last score of 0.71 ft/sec    Time  4    Period  Weeks    Status  Not Met    Target Date  03/20/19      PT SHORT TERM GOAL #6   Title  Patient will negotiate 4 stairs with 2 rails, step to sequence with supervision    Baseline  Patient needs CGA for safety    Time  4    Period  Weeks    Status  Partially Met    Target  Date  03/20/19        PT Long Term Goals - 02/18/19 0827      PT LONG TERM GOAL #1   Title  Patient will report performing HEP atleast 5 days a week with  husband's supervision    Baseline  daughter provides supervision/min A    Time  8    Period  Weeks    Status  Revised    Target Date  04/19/19      PT LONG TERM GOAL #2   Title  Pt will ambulate 230' with LRAD over indoor surfaces with supervision and will report ambulating short distances at home with LRAD and husband's supervision    Baseline  --    Time  8    Period  Weeks    Status  Revised    Target Date  04/19/19      PT LONG TERM GOAL #3   Title  Patient will increase BERG score to at least 34/56 in order to decrease risk of future falls.    Baseline  --    Time  8    Period  Weeks    Status  Revised    Target Date  04/19/19      PT LONG TERM GOAL #4   Title  Pt will perform sit <> stand and stand pivots with LRAD MOD I    Baseline  --    Time  8    Period  Weeks    Status  Revised    Target Date  04/19/19      PT LONG TERM GOAL #5   Title  Patient will increase gait speed to at least 1.8t/sec with LRAD in order to decrease risk of falls and improve household ambulation.    Baseline  --    Time  8    Period  Weeks    Status  Revised    Target Date  04/19/19      PT LONG TERM GOAL #6   Title  Pt will ambulate x 100' outside over pavement with LRAD and supervision-min A    Baseline  --    Time  8    Period  Weeks    Status  Revised    Target Date  04/19/19            Plan - 04/01/19 1413    Clinical Impression Statement  Patient more fatigued today and feeling very tight.  Initiated with stretching and reviewed beneficial stretches with daughter to perform at home.  Following stretches pt demonstrated improvement in overall posture in sitting and during gait with improved clearance of RLE.  Initiated functional training and standing balance to work towards patient's goal to place meals in and out of oven safely.  Despite fatigue pt was able to perform transfers and bed mobility with supervision-min guard.    Comorbidities  Hypertension, Diabetes, Chronic  kidney disease    PT Treatment/Interventions  ADLs/Self Care Home Management;Gait training;DME Instruction;Stair training;Functional mobility training;Therapeutic activities;Patient/family education;Neuromuscular re-education;Balance training;Therapeutic exercise;Passive range of motion;Orthotic Fit/Training    PT Next Visit Plan  Go in kitchen and practice putting pans into and out of oven.  Work on turns with RW.  set up [employer] for L LE for quad strength?  (may not be beneficial unless patient willing to improve functional carryover of gait), LE strengthening, functional mobility.  Patient's goal is to eventually be able to take prepared meal out of microwave or oven.  PT Home Exercise Plan  [id number]    Consulted and Agree with Plan of Care  Patient;Family member/caregiver    Family Member Consulted  daughter, [name]       Patient will benefit from skilled therapeutic intervention in order to improve the following deficits and impairments:  Abnormal gait, Decreased activity tolerance, Decreased balance, Decreased range of motion, Decreased mobility, Decreased endurance, Decreased skin integrity, Decreased strength, Difficulty walking, Impaired perceived functional ability, Postural dysfunction  Visit Diagnosis: Unsteadiness on feet  Other abnormalities of gait and mobility  Muscle weakness (generalized)  Hemiplegia and hemiparesis following cerebral infarction affecting left non-dominant side [facility])     Problem List Patient Active Problem List   Diagnosis Date Noted  . Iron deficiency anemia 02/03/2019  . Pelvic fracture ([place]) 09/21/2018  . Allergic reaction caused by a drug 05/06/2018  . Bilateral leg edema 04/13/2018  . HLD (hyperlipidemia) 03/23/2018  . Mood swings 03/23/2018  . Chronic kidney disease (CKD), stage III (moderate) (HCC)   . Embolic stroke ([place]) 02/07/2335  . Left hemiparesis ([place])   . Diabetes mellitus type 2 in nonobese (HCC)   . Acute ischemic stroke ([name])    . Left arm weakness   . Acute CVA (cerebrovascular accident) ([place]) 02/24/2018  . Overweight (BMI 25.0-29.9) 02/24/2018  . CKD (chronic kidney disease), stage III ([place]) 02/24/2018  . Diabetes mellitus without complication ([place]) 06/30/4974  . HTN (hypertension) 09/02/2017    Rico Junker, PT, DPT 04/01/19    2:23 PM    [facility] [address] [place], [place], [address] Phone: [phone]   Fax:  [phone]  Name: [name] MRN: [id number] Date of Birth: 04/06/1946

## 2019-04-05 ENCOUNTER — Ambulatory Visit: Payer: Medicare Other | Admitting: Rehabilitative and Restorative Service Providers"

## 2019-04-05 ENCOUNTER — Encounter: Payer: Self-pay | Admitting: Rehabilitative and Restorative Service Providers"

## 2019-04-05 ENCOUNTER — Other Ambulatory Visit: Payer: Self-pay

## 2019-04-05 ENCOUNTER — Ambulatory Visit: Payer: Medicare Other | Admitting: Occupational Therapy

## 2019-04-05 DIAGNOSIS — R2681 Unsteadiness on feet: Secondary | ICD-10-CM

## 2019-04-05 DIAGNOSIS — I69354 Hemiplegia and hemiparesis following cerebral infarction affecting left non-dominant side: Secondary | ICD-10-CM

## 2019-04-05 DIAGNOSIS — M6281 Muscle weakness (generalized): Secondary | ICD-10-CM

## 2019-04-05 DIAGNOSIS — R2689 Other abnormalities of gait and mobility: Secondary | ICD-10-CM

## 2019-04-05 DIAGNOSIS — R278 Other lack of coordination: Secondary | ICD-10-CM

## 2019-04-05 NOTE — Therapy (Signed)
[facility] [address] [place], [place], [address] Phone: [phone]   Fax:  [phone]  Physical Therapy Treatment  Patient Details  Name: [name] MRN: [id number] Date of Birth: 1946/02/16 Referring Provider (PT): Alysia Penna, MD   Encounter Date: 04/05/2019  PT End of Session - 04/05/19 0914    Visit Number  37    Number of Visits  40    Date for PT Re-Evaluation  04/19/19    Authorization Type  [employer] Medicare, 10th visit progress note    PT Start Time  0850    PT Stop Time  0932    PT Time Calculation (min)  42 min    Activity Tolerance  Patient limited by fatigue    Behavior During Therapy  [facility] for tasks assessed/performed       Past Medical History:  Diagnosis Date  . Allergy   . Arthritis   . Cataract   . Diabetes mellitus without complication ([place])   . Hypertension   . Macular degeneration syndrome    with edema---being treated.     Past Surgical History:  Procedure Laterality Date  . callous removal  Left 2017   located on 1st digit on L foot 2/2 diabetes  . EYE SURGERY      There were no vitals filed for this visit.  Subjective Assessment - 04/05/19 0853    Subjective  Pt present with daughter. Just finished OT. Pt and daughter report limited walking at home.    Pertinent History  Patient had a R CVA 02/2018 and was walking with a SPC prior to a fall in March 2020 resulting in being hospitalized with multiple pelvic fxs and a L hip fx. During hospital stay, patient acquired pressure ulcers on bilateral soles of feet near heels.Patient is now safely able to bear weight and pressure ulcers are now a stage 1 (per daughter report and photos).    Patient Stated Goals  "Be back to normal, get back to walking, and getting strength back"                       [facility] Adult PT Treatment/Exercise - 04/05/19 0855      Transfers   Sit to Stand  4: Min guard    Sit to Stand  Details  Verbal cues for technique    Stand to Sit  4: Min guard    Stand to Sit Details (indicate cue type and reason)  Tactile cues for sequencing;Verbal cues for technique    Comments  Pt needed verbal cues to be sure to back all the way up to chair prior to sitting so legs feel chair.       Ambulation/Gait   Ambulation/Gait  Yes    Gait Comments  Pt ambulated 55' x 2 with FWW with CGA/min assist with decreased step length and foot clearance with slow, reciprocal pattern. Verbal cues to stay up in walker and try to keep head up..Seated rest break between laps. Pt reported being fatigued after gait. Flexed trunk with gait.   Ambulated 10 feet x 2 with RW and CGA scooting chair away from a table to work on household tasks and obstacle negotiation/management.      Exercises   Exercises  Knee/Hip    Other Exercises   Supine right passive hip flexion stretch x 30 sec, right hip extension in supine with gentle overpressure on thigh, pasive right hip ER stretch x 30  sec, right hamstring stretch x 30 sec passive. Supine left hamstring stretch x 30 sec, left ER stretch. PT had to watch hand placement due to tenderness in muscle. Partial bridge with verbal and tactile cues to help initiate more contraction x 10.          Balance Exercises - 04/05/19 0918      Balance Exercises: Standing   Other Standing Exercises  Pt stood at walker with verbal cues for upright posture for 3 min 10 sec.        PT Education - 04/05/19 0920    Education Details  Pt instructed in performed 2 standing bouts for 2 min/day at walker so that she can perform on own. Pt agreed that this was activity she felt was doable and could perform on her own. States she will start performing.    Person(s) Educated  Patient;Child(ren)    Methods  Explanation;Demonstration;Verbal cues    Comprehension  Verbalized understanding;Returned demonstration       PT Short Term Goals - 03/19/19 1618      PT SHORT TERM GOAL #1   Title   Pt will demonstrate ability to perform ongoing HEP (stretching and standing exercises) with supervision    Baseline  03/17/19: pt reports consistently doing HEP with daugher, sometimes spouse, assistance    Status  Achieved    Target Date  03/20/19      PT SHORT TERM GOAL #2   Title  Patient will increase BERG score to at least a 30/56 in order to help decrease risk of future falls.    Baseline  03/17/19: 35/56 scored today    Status  Achieved    Target Date  03/20/19      PT SHORT TERM GOAL #3   Title  Pt will perform stand pivot transfers with rollator x 5 reps with distant supervision    Baseline  03/17/19: met with  supervision (PTA near wheelchair) with RW (does not use rollator), no physical assistance needed, only cues.    Status  Achieved    Target Date  03/20/19      PT SHORT TERM GOAL #4   Title  Pt will ambulate x 115' with LRAD on level, indoor surfaces with distant supervision    Baseline  min guard x 200' with rollator    Time  4    Period  Weeks    Status  Not Met    Target Date  03/20/19      PT SHORT TERM GOAL #5   Title  Pt will improve gait velocity with LRAD to >/= 1.0 ft/sec    Baseline  03/17/19: 0.58 ft/sec, slower that last score of 0.71 ft/sec    Time  4    Period  Weeks    Status  Not Met    Target Date  03/20/19      PT SHORT TERM GOAL #6   Title  Patient will negotiate 4 stairs with 2 rails, step to sequence with supervision    Baseline  Patient needs CGA for safety    Time  4    Period  Weeks    Status  Partially Met    Target Date  03/20/19        PT Long Term Goals - 02/18/19 0827      PT LONG TERM GOAL #1   Title  Patient will report performing HEP atleast 5 days a week with husband's supervision    Baseline  daughter provides  supervision/min A    Time  8    Period  Weeks    Status  Revised    Target Date  04/19/19      PT LONG TERM GOAL #2   Title  Pt will ambulate 47' with LRAD over indoor surfaces with supervision and will report  ambulating short distances at home with LRAD and husband's supervision    Baseline  --    Time  8    Period  Weeks    Status  Revised    Target Date  04/19/19      PT LONG TERM GOAL #3   Title  Patient will increase BERG score to at least 34/56 in order to decrease risk of future falls.    Baseline  --    Time  8    Period  Weeks    Status  Revised    Target Date  04/19/19      PT LONG TERM GOAL #4   Title  Pt will perform sit <> stand and stand pivots with LRAD MOD I    Baseline  --    Time  8    Period  Weeks    Status  Revised    Target Date  04/19/19      PT LONG TERM GOAL #5   Title  Patient will increase gait speed to at least 1.8t/sec with LRAD in order to decrease risk of falls and improve household ambulation.    Baseline  --    Time  8    Period  Weeks    Status  Revised    Target Date  04/19/19      PT LONG TERM GOAL #6   Title  Pt will ambulate x 100' outside over pavement with LRAD and supervision-min A    Baseline  --    Time  8    Period  Weeks    Status  Revised    Target Date  04/19/19            Plan - 04/05/19 1440    Clinical Impression Statement  The patient fatigued easily with ambulation today.  PT modified HEP to focus on 4 activities (printed 4 exercises and saved prior ones in medbridge program to slowly add back).  The patient's main limiting factor continues to be carryover to home.  We emphasized small changes each week.  She is continuing to stand to brush her teeth and walks to the bathroom 2 times/day.  PT recommended we add sit<>stand x 2 reps maintaining standing x 3-4 minutes each time.    PT Treatment/Interventions  ADLs/Self Care Home Management;Gait training;DME Instruction;Stair training;Functional mobility training;Therapeutic activities;Patient/family education;Neuromuscular re-education;Balance training;Therapeutic exercise;Passive range of motion;Orthotic Fit/Training    PT Next Visit Plan  Progress standing tolerance, work  on taking items out of microwave (work with OT to not duplicate), standing LE strengthening, posture, functional mobility.  Emphasize carryover to home environment with small increases in mobility.    PT Home Exercise Plan  [email or web address]    Consulted and Agree with Plan of Care  Patient;Family member/caregiver    Family Member Consulted  daughter, [name]       Patient will benefit from skilled therapeutic intervention in order to improve the following deficits and impairments:  Abnormal gait, Decreased activity tolerance, Decreased balance, Decreased range of motion, Decreased mobility, Decreased endurance, Decreased skin integrity, Decreased strength, Difficulty walking, Impaired perceived functional ability, Postural dysfunction  Visit Diagnosis: Unsteadiness on feet  Muscle weakness (generalized)  Other abnormalities of gait and mobility     Problem List Patient Active Problem List   Diagnosis Date Noted  . Iron deficiency anemia 02/03/2019  . Pelvic fracture ([place]) 09/21/2018  . Allergic reaction caused by a drug 05/06/2018  . Bilateral leg edema 04/13/2018  . HLD (hyperlipidemia) 03/23/2018  . Mood swings 03/23/2018  . Chronic kidney disease (CKD), stage III (moderate) (HCC)   . Embolic stroke ([place]) 05/06/1313  . Left hemiparesis ([place])   . Diabetes mellitus type 2 in nonobese (HCC)   . Acute ischemic stroke ([place])   . Left arm weakness   . Acute CVA (cerebrovascular accident) ([place]) 02/24/2018  . Overweight (BMI 25.0-29.9) 02/24/2018  . CKD (chronic kidney disease), stage III ([place]) 02/24/2018  . Diabetes mellitus without complication ([place]) 38/88/7579  . HTN (hypertension) 09/02/2017    Charitie Hinote, PT 04/05/2019, 9:32 PM  [facility] [address] [place], [place], [address] Phone: [phone]   Fax:  [phone]  Name: [name] MRN: [id number] Date of Birth: [birth year]

## 2019-04-05 NOTE — Patient Instructions (Signed)
Access Code: [email or web address]  URL: [email or web address]  Date: 04/05/2019  Prepared by: Rudell Cobb   Exercises Sit to Stand with Counter Support - 10 reps - 1 sets - 2x daily - 5x weekly Supine Bridge with Resistance Band - 10 reps - 1 sets - 1x daily - 5x weekly Modified Thomas Stretch - 2 sets - 60 second hold - 1x daily - 7x weekly 90/90 Lower Trunk Rotation - 10 reps - 2 sets - 1x daily - 7x weekly

## 2019-04-05 NOTE — Therapy (Signed)
[facility] [address] [place], [place], [address] Phone: [phone]   Fax:  [phone]  Occupational Therapy Treatment  Patient Details  Name: [name] MRN: [id number] Date of Birth: [birth year] Referring Provider (OT): Dr. Letta Pate   Encounter Date: 04/05/2019  OT End of Session - 04/05/19 0833    Visit Number  27    Number of Visits  32    Date for OT Re-Evaluation  04/20/19    Authorization Type  [employer] MCR    Authorization Time Period  cert. period 02/19/19-05/20/19    Authorization - Visit Number  46    Authorization - Number of Visits  30    OT Start Time  0800    OT Stop Time  0845    OT Time Calculation (min)  45 min    Activity Tolerance  Patient tolerated treatment well    Behavior During Therapy  WFL for tasks assessed/performed       Past Medical History:  Diagnosis Date  . Allergy   . Arthritis   . Cataract   . Diabetes mellitus without complication ([place])   . Hypertension   . Macular degeneration syndrome    with edema---being treated.     Past Surgical History:  Procedure Laterality Date  . callous removal  Left 2017   located on 1st digit on L foot 2/2 diabetes  . EYE SURGERY      There were no vitals filed for this visit.  Subjective Assessment - 04/05/19 0804    Subjective   Feel pretty good    Pertinent History  CVA 02/27/18, pelvic fx and Lt hip  fx 09/2018. PMH: HTN, OA    Limitations  Fall risk, wounds on feet    Currently in Pain?  No/denies       Pt walking 10-15 feet to bathroom and standing for clothes management with close supervision. Pt required min assist to stand from toilet (but has 3-in-1 over commode at home), then cues to stand and get balance before donning clothes back over hips. Pt also stood to wash hands w/ CGA due to fatigue and decreased balance.  Seated: using Lt hand to place large pegs in pegboard w/ occasional assist  needed.                      OT Short Term Goals - 03/25/19 1108      OT SHORT TERM GOAL #1   Title  Independent with initial HEP for shoulder ROM, and functional reaching LUE    Time  4    Period  Weeks    Status  Achieved      OT SHORT TERM GOAL #2   Title  Pt to perform UB dressing with set up only and LE dressing with mod assist or less using A/E prn    Time  4    Period  Weeks    Status  Achieved      OT SHORT TERM GOAL #3   Title  Pt to increase LUE functional use as evidenced by performing 15 blocks on Box & Blocks test.--STGs due 03/21/19   (modified 03/19/19)    Baseline  11 blocks    Time  4    Period  Weeks    Status  Revised   01/27/19:  11 blocks, 02/17/19:  13, 03/19/19 10 blocks     OT SHORT TERM GOAL #4   Title  Pt to  perform toileting/clothes management with only min assist    Period  Weeks    Status  Achieved   Able to complete - simulation -  in clinic.  01/27/19:  met per pt/dtr report     OT SHORT TERM GOAL #5   Title  Pt to achieve 90* shoulder flexion LUE for mid level reaching    Time  4    Period  Weeks    Status  Not Met   01/18/19:  75* with min compensation.  02/17/19:  85* with min compensation.  03/09/19: inconsistent. 03/25/19: 85* w/ compensations       OT Long Term Goals - 03/25/19 1110      OT LONG TERM GOAL #1   Title  Independent with updated HEP - due 04/20/19    Time  8    Period  Weeks    Status  On-going      OT LONG TERM GOAL #2   Title  Pt to be mod I with all BADLS using A/E as needed (except shower transfers to still be supervision)    Time  8    Period  Weeks    Status  On-going   02/17/19:  assist for compression stockings, min A for depends, supervision for shower transfer (mod I for bathing), toileting with BSC mod I     OT LONG TERM GOAL #3   Title  Pt to improve LUE function as evidenced by performing 19 blocks or greater on Box & Blocks test--revised 03/19/19    Time  8    Period  Weeks    Status   Revised   02/17/19:  13     OT LONG TERM GOAL #4   Title  Pt to improve coordination as evidenced by performing 9 hole peg test completely in 90 sec or less    Baseline  placed 5 in 2 min.    Time  8    Period  Weeks    Status  On-going   02/17/19:  5 pegs within 20mn.  completed in 457m 43sec     OT LONG TERM GOAL #5   Title  Grip strength Lt hand to be 15 lbs or greater    Baseline  8 lbs    Time  8    Period  Weeks    Status  On-going   02/17/19:  5lbs     OT LONG TERM GOAL #6   Title  Pt to perform simple snack, cold food, and microwaveable food mod I level with DME prn without LOB    Time  8    Period  Weeks    Status  On-going   02/17/19:  not performing. 03/25/19: performed in clinic making sandwich w/ close sup (partly from w/c level, mostly from standing)     OT LONG TERM GOAL #7   Title  Pt to achieve 110* or greater P/ROM Lt shoulder with pain less than or equal to 5/10    Time  8    Period  Weeks    Status  Achieved   02/17/19:  110-115* in sitting with 4-5/10 pain           Plan - 04/05/19 0834    Clinical Impression Statement  Pt making progress with functional transfers and toileting tasks    Occupational performance deficits (Please refer to evaluation for details):  ADL's;IADL's;Leisure    Body Structure / Function / Physical Skills  ADL;ROM;Balance;IADL;Body mechanics;Improper spinal/pelvic alignment;Mobility;Strength;Flexibility;FMC;Coordination;UE functional  use;Proprioception;Decreased knowledge of use of DME    Rehab Potential  Good    OT Frequency  2x / week    OT Duration  8 weeks    OT Treatment/Interventions  Self-care/ADL training;Therapeutic exercise;Functional Mobility Training;Neuromuscular education;Aquatic Therapy;Manual Therapy;Splinting;Therapeutic activities;Paraffin;DME and/or AE instruction;Cognitive remediation/compensation;Electrical Stimulation;Visual/perceptual remediation/compensation;Moist Heat;Passive range of motion;Patient/family  education    Plan  continue functional mobility, LUE ROM and functional use    Consulted and Agree with Plan of Care  Patient;Family member/caregiver    Family Member Consulted  daughter       Patient will benefit from skilled therapeutic intervention in order to improve the following deficits and impairments:   Body Structure / Function / Physical Skills: ADL, ROM, Balance, IADL, Body mechanics, Improper spinal/pelvic alignment, Mobility, Strength, Flexibility, FMC, Coordination, UE functional use, Proprioception, Decreased knowledge of use of DME       Visit Diagnosis: Unsteadiness on feet  Hemiplegia and hemiparesis following cerebral infarction affecting left non-dominant side (HCC)  Other lack of coordination    Problem List Patient Active Problem List   Diagnosis Date Noted  . Iron deficiency anemia 02/03/2019  . Pelvic fracture ([place]) 09/21/2018  . Allergic reaction caused by a drug 05/06/2018  . Bilateral leg edema 04/13/2018  . HLD (hyperlipidemia) 03/23/2018  . Mood swings 03/23/2018  . Chronic kidney disease (CKD), stage III (moderate) (HCC)   . Embolic stroke ([place]) 66/44/0347  . Left hemiparesis ([place])   . Diabetes mellitus type 2 in nonobese (HCC)   . Acute ischemic stroke ([place])   . Left arm weakness   . Acute CVA (cerebrovascular accident) ([place]) 02/24/2018  . Overweight (BMI 25.0-29.9) 02/24/2018  . CKD (chronic kidney disease), stage III ([place]) 02/24/2018  . Diabetes mellitus without complication ([place]) 42/59/5638  . HTN (hypertension) 09/02/2017    Carey Bullocks, OTR/L 04/05/2019, 8:38 AM  [facility] [address] [place], [place], [address] Phone: [phone]   Fax:  [phone]  Name: IMAJEAN MCDERMID MRN: [id number] Date of Birth: [birth year]

## 2019-04-06 ENCOUNTER — Encounter: Payer: Medicare Other | Attending: Physical Medicine & Rehabilitation | Admitting: Physical Medicine & Rehabilitation

## 2019-04-06 ENCOUNTER — Encounter: Payer: Self-pay | Admitting: Physical Medicine & Rehabilitation

## 2019-04-06 VITALS — BP 193/78 | HR 70 | Temp 97.7°F | Ht 62.0 in | Wt 165.0 lb

## 2019-04-06 DIAGNOSIS — G8114 Spastic hemiplegia affecting left nondominant side: Secondary | ICD-10-CM | POA: Diagnosis present

## 2019-04-06 DIAGNOSIS — M25512 Pain in left shoulder: Secondary | ICD-10-CM | POA: Insufficient documentation

## 2019-04-06 DIAGNOSIS — F419 Anxiety disorder, unspecified: Secondary | ICD-10-CM | POA: Diagnosis not present

## 2019-04-06 DIAGNOSIS — R262 Difficulty in walking, not elsewhere classified: Secondary | ICD-10-CM | POA: Insufficient documentation

## 2019-04-06 DIAGNOSIS — R450 Nervousness: Secondary | ICD-10-CM | POA: Diagnosis not present

## 2019-04-06 DIAGNOSIS — E1122 Type 2 diabetes mellitus with diabetic chronic kidney disease: Secondary | ICD-10-CM | POA: Diagnosis not present

## 2019-04-06 DIAGNOSIS — I129 Hypertensive chronic kidney disease with stage 1 through stage 4 chronic kidney disease, or unspecified chronic kidney disease: Secondary | ICD-10-CM | POA: Diagnosis not present

## 2019-04-06 DIAGNOSIS — R531 Weakness: Secondary | ICD-10-CM | POA: Diagnosis not present

## 2019-04-06 DIAGNOSIS — M1990 Unspecified osteoarthritis, unspecified site: Secondary | ICD-10-CM | POA: Insufficient documentation

## 2019-04-06 NOTE — Progress Notes (Signed)
Botox Injection for spasticity using needle EMG guidance  Dilution: 50 Units/ml Indication: Severe spasticity which interferes with ADL,mobility and/or  hygiene and is unresponsive to medication management and other conservative care Informed consent was obtained after describing risks and benefits of the procedure with the patient. This includes bleeding, bruising, infection, excessive weakness, or medication side effects. A REMS form is on file and signed. Needle: 27g 1" needle electrode Number of units per muscle  Pec 175 Bic 75 BR 50 FDS 25 FDP50 FCR 25 All injections were done after obtaining appropriate EMG activity and after negative drawback for blood. The patient tolerated the procedure well. Post procedure instructions were given. A followup appointment was made.

## 2019-04-06 NOTE — Patient Instructions (Signed)
You received a Botox injection today. You may experience soreness at the needle injection sites. Please call us if any of the injection sites turns red after a couple days or if there is any drainage. You may experience muscle weakness as a result of Botox. This would improve with time but can take several weeks to improve. The Botox should start working in about one week. The Botox usually last 3 months. The injection can be repeated every 3 months as needed. Treatment Pec 175 Bic 75 BR 50 FDS 25 FDP50 FCR 25

## 2019-04-07 ENCOUNTER — Encounter: Payer: Self-pay | Admitting: Physical Therapy

## 2019-04-07 ENCOUNTER — Encounter: Payer: Self-pay | Admitting: Occupational Therapy

## 2019-04-07 ENCOUNTER — Other Ambulatory Visit: Payer: Self-pay

## 2019-04-07 ENCOUNTER — Ambulatory Visit: Payer: Medicare Other | Admitting: Physical Therapy

## 2019-04-07 ENCOUNTER — Ambulatory Visit: Payer: Medicare Other | Admitting: Occupational Therapy

## 2019-04-07 DIAGNOSIS — M6281 Muscle weakness (generalized): Secondary | ICD-10-CM

## 2019-04-07 DIAGNOSIS — R278 Other lack of coordination: Secondary | ICD-10-CM

## 2019-04-07 DIAGNOSIS — I69354 Hemiplegia and hemiparesis following cerebral infarction affecting left non-dominant side: Secondary | ICD-10-CM

## 2019-04-07 DIAGNOSIS — M25612 Stiffness of left shoulder, not elsewhere classified: Secondary | ICD-10-CM

## 2019-04-07 DIAGNOSIS — R2689 Other abnormalities of gait and mobility: Secondary | ICD-10-CM

## 2019-04-07 DIAGNOSIS — M25512 Pain in left shoulder: Secondary | ICD-10-CM

## 2019-04-07 DIAGNOSIS — R2681 Unsteadiness on feet: Secondary | ICD-10-CM

## 2019-04-07 DIAGNOSIS — G8929 Other chronic pain: Secondary | ICD-10-CM

## 2019-04-07 NOTE — Therapy (Signed)
[facility] [address] [place], [place], [address] Phone: [phone]   Fax:  [phone]  Physical Therapy Treatment  Patient Details  Name: [name] MRN: [id number] Date of Birth: 04/26/1946 Referring Provider (PT): Alysia Penna, MD   Encounter Date: 04/07/2019  PT End of Session - 04/07/19 0810    Visit Number  38    Number of Visits  40    Date for PT Re-Evaluation  04/19/19    Authorization Type  [employer] Medicare, 10th visit progress note    PT Start Time  0720    PT Stop Time  0804    PT Time Calculation (min)  44 min    Activity Tolerance  Patient tolerated treatment well    Behavior During Therapy  [facility] for tasks assessed/performed       Past Medical History:  Diagnosis Date  . Allergy   . Arthritis   . Cataract   . Diabetes mellitus without complication ([place])   . Hypertension   . Macular degeneration syndrome    with edema---being treated.     Past Surgical History:  Procedure Laterality Date  . callous removal  Left 2017   located on 1st digit on L foot 2/2 diabetes  . EYE SURGERY      There were no vitals filed for this visit.  Subjective Assessment - 04/07/19 0730    Subjective  Pt received Botox in UE yesterday.  Increased amount of Botox.  Pt walked with husband 3-4 times yesterday and did a lot of sit <> stand from chair.    Pertinent History  Patient had a R CVA 02/2018 and was walking with a SPC prior to a fall in March 2020 resulting in being hospitalized with multiple pelvic fxs and a L hip fx. During hospital stay, patient acquired pressure ulcers on bilateral soles of feet near heels.Patient is now safely able to bear weight and pressure ulcers are now a stage 1 (per daughter report and photos).    Patient Stated Goals  "Be back to normal, get back to walking, and getting strength back"    Currently in Pain?  No/denies                       [facility] Adult PT  Treatment/Exercise - 04/07/19 0732      Transfers   Transfers  Sit to Stand;Stand to Sit    Sit to Stand  5: Supervision    Sit to Stand Details (indicate cue type and reason)  cues to push from chair with LUE instead of pulling    Stand to Sit  5: Supervision      Ambulation/Gait   Ambulation/Gait  Yes    Ambulation/Gait Assistance  4: Min guard    Ambulation/Gait Assistance Details  from waiting area to gym with RW.  Cues to increase pushing through LUE to maintain straight path with RW and to turn to R    Ambulation Distance (Feet)  100 Feet    Assistive device  Rolling walker    Ambulation Surface  Level;Indoor      Neuro Re-ed    Neuro Re-ed Details   NMR focusing on trunk extension and extension through LUE and LLE.  Performed seated therapy ball roll outs with bilat UE x 5 reps to stretch L trunk and UE, added in coming to squat and holding WB through bilat UE and then progressed to lifting RUE and WB through  LUE only during roll outs and during maintained squats x 5 reps each.  Also focused on controlled squat > sit.      Knee/Hip Exercises: Standing   Lateral Step Up  Left;Hand Hold: 1;Step Height: 4"    Lateral Step Up Limitations  2 reps lateral step up to L pushing LUE along counter to L to facilitate L weight shift, trunk elongation and extension.  Pt became very nervous and required R HHA from therapist for support.               PT Short Term Goals - 03/19/19 1618      PT SHORT TERM GOAL #1   Title  Pt will demonstrate ability to perform ongoing HEP (stretching and standing exercises) with supervision    Baseline  03/17/19: pt reports consistently doing HEP with daugher, sometimes spouse, assistance    Status  Achieved    Target Date  03/20/19      PT SHORT TERM GOAL #2   Title  Patient will increase BERG score to at least a 30/56 in order to help decrease risk of future falls.    Baseline  03/17/19: 35/56 scored today    Status  Achieved    Target Date   03/20/19      PT SHORT TERM GOAL #3   Title  Pt will perform stand pivot transfers with rollator x 5 reps with distant supervision    Baseline  03/17/19: met with  supervision (PTA near wheelchair) with RW (does not use rollator), no physical assistance needed, only cues.    Status  Achieved    Target Date  03/20/19      PT SHORT TERM GOAL #4   Title  Pt will ambulate x 115' with LRAD on level, indoor surfaces with distant supervision    Baseline  min guard x 200' with rollator    Time  4    Period  Weeks    Status  Not Met    Target Date  03/20/19      PT SHORT TERM GOAL #5   Title  Pt will improve gait velocity with LRAD to >/= 1.0 ft/sec    Baseline  03/17/19: 0.58 ft/sec, slower that last score of 0.71 ft/sec    Time  4    Period  Weeks    Status  Not Met    Target Date  03/20/19      PT SHORT TERM GOAL #6   Title  Patient will negotiate 4 stairs with 2 rails, step to sequence with supervision    Baseline  Patient needs CGA for safety    Time  4    Period  Weeks    Status  Partially Met    Target Date  03/20/19        PT Long Term Goals - 02/18/19 0827      PT LONG TERM GOAL #1   Title  Patient will report performing HEP atleast 5 days a week with husband's supervision    Baseline  daughter provides supervision/min A    Time  8    Period  Weeks    Status  Revised    Target Date  04/19/19      PT LONG TERM GOAL #2   Title  Pt will ambulate 230' with LRAD over indoor surfaces with supervision and will report ambulating short distances at home with LRAD and husband's supervision    Baseline  --    Time  8    Period  Weeks    Status  Revised    Target Date  04/19/19      PT LONG TERM GOAL #3   Title  Patient will increase BERG score to at least 34/56 in order to decrease risk of future falls.    Baseline  --    Time  8    Period  Weeks    Status  Revised    Target Date  04/19/19      PT LONG TERM GOAL #4   Title  Pt will perform sit <> stand and stand  pivots with LRAD MOD I    Baseline  --    Time  8    Period  Weeks    Status  Revised    Target Date  04/19/19      PT LONG TERM GOAL #5   Title  Patient will increase gait speed to at least 1.8t/sec with LRAD in order to decrease risk of falls and improve household ambulation.    Baseline  --    Time  8    Period  Weeks    Status  Revised    Target Date  04/19/19      PT LONG TERM GOAL #6   Title  Pt will ambulate x 100' outside over pavement with LRAD and supervision-min A    Baseline  --    Time  8    Period  Weeks    Status  Revised    Target Date  04/19/19            Plan - 04/07/19 7026    Clinical Impression Statement  Pt has been ambulating at home with husband and increasing time standing more upright.  Pt able to ambulate into treatment area today from waiting area with RW but continues to favor RUE and required intermittent cues to advance RW with LUE to maintain straight path or to turn to R.  Rest of treatment session focused on NMR and functional strengthening and weight shifting to improve weight shift and activation of extensors on L side.  Pt continues to fatigue quickly with step ups.  Will continue to address in order to progress towards LTG.    PT Treatment/Interventions  ADLs/Self Care Home Management;Gait training;DME Instruction;Stair training;Functional mobility training;Therapeutic activities;Patient/family education;Neuromuscular re-education;Balance training;Therapeutic exercise;Passive range of motion;Orthotic Fit/Training    PT Next Visit Plan  WALKING TO/FROM TREATMENT AREA FROM WAITING AREA -PROGRESS AWAY FROM W/C.  Step ups to L and use of LUE and LLE to PUSH to L; Progress standing tolerance, work on taking items out of microwave (work with OT to not duplicate), standing LE strengthening, posture, functional mobility.  Emphasize carryover to home environment with small increases in mobility.    PT Home Exercise Plan  [id number]    Consulted and Agree  with Plan of Care  Patient;Family member/caregiver    Family Member Consulted  daughter, [name]       Patient will benefit from skilled therapeutic intervention in order to improve the following deficits and impairments:  Abnormal gait, Decreased activity tolerance, Decreased balance, Decreased range of motion, Decreased mobility, Decreased endurance, Decreased skin integrity, Decreased strength, Difficulty walking, Impaired perceived functional ability, Postural dysfunction  Visit Diagnosis: Unsteadiness on feet  Muscle weakness (generalized)  Other abnormalities of gait and mobility  Hemiplegia and hemiparesis following cerebral infarction affecting left non-dominant side [facility])     Problem List Patient Active Problem List   Diagnosis  Date Noted  . Iron deficiency anemia 02/03/2019  . Pelvic fracture ([place]) 09/21/2018  . Allergic reaction caused by a drug 05/06/2018  . Bilateral leg edema 04/13/2018  . HLD (hyperlipidemia) 03/23/2018  . Mood swings 03/23/2018  . Chronic kidney disease (CKD), stage III (moderate) (HCC)   . Embolic stroke ([place]) 20/35/5974  . Left hemiparesis ([place])   . Diabetes mellitus type 2 in nonobese (HCC)   . Acute ischemic stroke ([place])   . Left arm weakness   . Acute CVA (cerebrovascular accident) ([place]) 02/24/2018  . Overweight (BMI 25.0-29.9) 02/24/2018  . CKD (chronic kidney disease), stage III ([place]) 02/24/2018  . Diabetes mellitus without complication ([place]) 16/38/4536  . HTN (hypertension) 09/02/2017    Rico Junker, PT, DPT 04/07/19    8:16 AM    [facility] [address] [place], [place], [address] Phone: [phone]   Fax:  [phone]  Name: KYLIYAH STIRN MRN: [id number] Date of Birth: 02-08-1946

## 2019-04-07 NOTE — Therapy (Signed)
[facility] [address] [place], [place], [address] Phone: [phone]   Fax:  [phone]  Occupational Therapy Treatment  Patient Details  Name: [name] MRN: [id number] Date of Birth: [birth year] Referring Provider (OT): Dr. Letta Pate   Encounter Date: 04/07/2019  OT End of Session - 04/07/19 0859    Visit Number  28    Number of Visits  32    Date for OT Re-Evaluation  04/20/19    Authorization Type  [employer] MCR    Authorization Time Period  cert. period 02/19/19-05/20/19    Authorization - Visit Number  66    Authorization - Number of Visits  30    OT Start Time  0802    OT Stop Time  0846    OT Time Calculation (min)  44 min       Past Medical History:  Diagnosis Date  . Allergy   . Arthritis   . Cataract   . Diabetes mellitus without complication ([place])   . Hypertension   . Macular degeneration syndrome    with edema---being treated.     Past Surgical History:  Procedure Laterality Date  . callous removal  Left 2017   located on 1st digit on L foot 2/2 diabetes  . EYE SURGERY      There were no vitals filed for this visit.  Subjective Assessment - 04/07/19 0807    Subjective   I get tired when I stand to wash these dishes    Patient is accompanied by:  Family member   dtr   Pertinent History  CVA 02/27/18, pelvic fx and Lt hip  fx 09/2018. PMH: HTN, OA    Limitations  Fall risk, wounds on feet    Currently in Pain?  No/denies                   OT Treatments/Exercises (OP) - 04/07/19 2563      Neurological Re-education Exercises   Other Exercises 1  Neuro re ed using familiar functional bilateral activities to address attention to LUE, functional reach, hand orientation to task and functional use (washing dishes, folding clothes, ironing with L hand).  Also addressed functional ambulation and activity tolerance by asking pt to ambulate to kitchen, stand to wash dishes and  ambulate back to waiting room at end of session. Pt required 4 rest breaks during 15 minute dishwashing activity.  Pt able to stand with supervision during this task. Required frequent cues and occassional hand over hand to use L hand appropriately during task. Pt has good active movement in L hand however has difficulty with appropriate hand orientation to task - sustained functional use of L hand improves when pt is assisted with hand orientation.  Discussed with daughter building in simple familiar functional tasks so that pt will better incorporate LUE into tasks with goal being more spontaneous incorporation into bilateral tasks. Daughter verbalized understanding.  Pt required intermittent light faciliation for R weight shift with functional ambulation at end of session however was able to walk to waiting room without a rest break after 2 therapy sessions.              OT Education - 04/07/19 0857    Education Details  incorporation of LUE into familiar functional tasks    Person(s) Educated  Patient;Child(ren)    Methods  Explanation;Demonstration    Comprehension  Verbalized understanding;Returned demonstration       OT Short Term Goals -  03/25/19 1108      OT SHORT TERM GOAL #1   Title  Independent with initial HEP for shoulder ROM, and functional reaching LUE    Time  4    Period  Weeks    Status  Achieved      OT SHORT TERM GOAL #2   Title  Pt to perform UB dressing with set up only and LE dressing with mod assist or less using A/E prn    Time  4    Period  Weeks    Status  Achieved      OT SHORT TERM GOAL #3   Title  Pt to increase LUE functional use as evidenced by performing 15 blocks on Box & Blocks test.--STGs due 03/21/19   (modified 03/19/19)    Baseline  11 blocks    Time  4    Period  Weeks    Status  Revised   01/27/19:  11 blocks, 02/17/19:  13, 03/19/19 10 blocks     OT SHORT TERM GOAL #4   Title  Pt to perform toileting/clothes management with only min  assist    Period  Weeks    Status  Achieved   Able to complete - simulation -  in clinic.  01/27/19:  met per pt/dtr report     OT SHORT TERM GOAL #5   Title  Pt to achieve 90* shoulder flexion LUE for mid level reaching    Time  4    Period  Weeks    Status  Not Met   01/18/19:  75* with min compensation.  02/17/19:  85* with min compensation.  03/09/19: inconsistent. 03/25/19: 85* w/ compensations       OT Long Term Goals - 03/25/19 1110      OT LONG TERM GOAL #1   Title  Independent with updated HEP - due 04/20/19    Time  8    Period  Weeks    Status  On-going      OT LONG TERM GOAL #2   Title  Pt to be mod I with all BADLS using A/E as needed (except shower transfers to still be supervision)    Time  8    Period  Weeks    Status  On-going   02/17/19:  assist for compression stockings, min A for depends, supervision for shower transfer (mod I for bathing), toileting with BSC mod I     OT LONG TERM GOAL #3   Title  Pt to improve LUE function as evidenced by performing 19 blocks or greater on Box & Blocks test--revised 03/19/19    Time  8    Period  Weeks    Status  Revised   02/17/19:  13     OT LONG TERM GOAL #4   Title  Pt to improve coordination as evidenced by performing 9 hole peg test completely in 90 sec or less    Baseline  placed 5 in 2 min.    Time  8    Period  Weeks    Status  On-going   02/17/19:  5 pegs within 64mn.  completed in 466m 43sec     OT LONG TERM GOAL #5   Title  Grip strength Lt hand to be 15 lbs or greater    Baseline  8 lbs    Time  8    Period  Weeks    Status  On-going   02/17/19:  5lbs  OT LONG TERM GOAL #6   Title  Pt to perform simple snack, cold food, and microwaveable food mod I level with DME prn without LOB    Time  8    Period  Weeks    Status  On-going   02/17/19:  not performing. 03/25/19: performed in clinic making sandwich w/ close sup (partly from w/c level, mostly from standing)     OT LONG TERM GOAL #7   Title  Pt to  achieve 110* or greater P/ROM Lt shoulder with pain less than or equal to 5/10    Time  8    Period  Weeks    Status  Achieved   02/17/19:  110-115* in sitting with 4-5/10 pain           Plan - 04/07/19 0857    Clinical Impression Statement  Pt with slow but steady progress toward goals. Pt wtih improved attention to LUE and functional use of LUE with familiar functional tasks.    OT Occupational Profile and History  Detailed Assessment- Review of Records and additional review of physical, cognitive, psychosocial history related to current functional performance    Occupational performance deficits (Please refer to evaluation for details):  ADL's;IADL's;Leisure    Body Structure / Function / Physical Skills  ADL;ROM;Balance;IADL;Body mechanics;Improper spinal/pelvic alignment;Mobility;Strength;Flexibility;FMC;Coordination;UE functional use;Proprioception;Decreased knowledge of use of DME    Rehab Potential  Good    Clinical Decision Making  Several treatment options, min-mod task modification necessary    Comorbidities Affecting Occupational Performance:  Presence of comorbidities impacting occupational performance    Modification or Assistance to Complete Evaluation   Min-Moderate modification of tasks or assist with assess necessary to complete eval    OT Frequency  2x / week    OT Duration  8 weeks    OT Treatment/Interventions  Self-care/ADL training;Therapeutic exercise;Functional Mobility Training;Neuromuscular education;Aquatic Therapy;Manual Therapy;Splinting;Therapeutic activities;Paraffin;DME and/or AE instruction;Cognitive remediation/compensation;Electrical Stimulation;Visual/perceptual remediation/compensation;Moist Heat;Passive range of motion;Patient/family education    Plan  continue functional mobility, LUE ROM and functional use    Consulted and Agree with Plan of Care  Patient;Family member/caregiver    Family Member Consulted  daughter       Patient will benefit from  skilled therapeutic intervention in order to improve the following deficits and impairments:   Body Structure / Function / Physical Skills: ADL, ROM, Balance, IADL, Body mechanics, Improper spinal/pelvic alignment, Mobility, Strength, Flexibility, FMC, Coordination, UE functional use, Proprioception, Decreased knowledge of use of DME       Visit Diagnosis: Unsteadiness on feet  Muscle weakness (generalized)  Hemiplegia and hemiparesis following cerebral infarction affecting left non-dominant side (HCC)  Other lack of coordination  Stiffness of left shoulder, not elsewhere classified  Chronic left shoulder pain    Problem List Patient Active Problem List   Diagnosis Date Noted  . Iron deficiency anemia 02/03/2019  . Pelvic fracture ([place]) 09/21/2018  . Allergic reaction caused by a drug 05/06/2018  . Bilateral leg edema 04/13/2018  . HLD (hyperlipidemia) 03/23/2018  . Mood swings 03/23/2018  . Chronic kidney disease (CKD), stage III (moderate) (HCC)   . Embolic stroke ([place]) 68/09/2120  . Left hemiparesis ([place])   . Diabetes mellitus type 2 in nonobese (HCC)   . Acute ischemic stroke ([place])   . Left arm weakness   . Acute CVA (cerebrovascular accident) ([place]) 02/24/2018  . Overweight (BMI 25.0-29.9) 02/24/2018  . CKD (chronic kidney disease), stage III ([place]) 02/24/2018  . Diabetes mellitus without complication ([place]) 48/25/0037  .  HTN (hypertension) 09/02/2017    Quay Burow, OTR/L 04/07/2019, 9:00 AM  [facility] [address] [place], [place], [address] Phone: [phone]   Fax:  [phone]  Name: [name] MRN: [id number] Date of Birth: [birth year]

## 2019-04-13 ENCOUNTER — Ambulatory Visit: Payer: Medicare Other | Attending: Physical Medicine & Rehabilitation | Admitting: Physical Therapy

## 2019-04-13 ENCOUNTER — Ambulatory Visit: Payer: Medicare Other | Admitting: Occupational Therapy

## 2019-04-13 ENCOUNTER — Encounter: Payer: Self-pay | Admitting: Physical Therapy

## 2019-04-13 ENCOUNTER — Other Ambulatory Visit: Payer: Self-pay

## 2019-04-13 DIAGNOSIS — M25612 Stiffness of left shoulder, not elsewhere classified: Secondary | ICD-10-CM

## 2019-04-13 DIAGNOSIS — M25512 Pain in left shoulder: Secondary | ICD-10-CM | POA: Insufficient documentation

## 2019-04-13 DIAGNOSIS — R2681 Unsteadiness on feet: Secondary | ICD-10-CM

## 2019-04-13 DIAGNOSIS — M6281 Muscle weakness (generalized): Secondary | ICD-10-CM

## 2019-04-13 DIAGNOSIS — G8929 Other chronic pain: Secondary | ICD-10-CM | POA: Insufficient documentation

## 2019-04-13 DIAGNOSIS — I69354 Hemiplegia and hemiparesis following cerebral infarction affecting left non-dominant side: Secondary | ICD-10-CM

## 2019-04-13 DIAGNOSIS — R278 Other lack of coordination: Secondary | ICD-10-CM | POA: Diagnosis not present

## 2019-04-13 DIAGNOSIS — R2689 Other abnormalities of gait and mobility: Secondary | ICD-10-CM | POA: Diagnosis not present

## 2019-04-13 NOTE — Therapy (Signed)
[facility] [address] [place], [place], [address] Phone: [phone]   Fax:  [phone]  Physical Therapy Treatment  Patient Details  Name: [name] MRN: [id number] Date of Birth: 04-28-1946 Referring Provider (PT): Alysia Penna, MD   Encounter Date: 04/13/2019  PT End of Session - 04/13/19 0725    Visit Number  39    Number of Visits  40    Date for PT Re-Evaluation  04/19/19    Authorization Type  [employer] Medicare, 10th visit progress note    PT Start Time  0720    PT Stop Time  0800    PT Time Calculation (min)  40 min    Equipment Utilized During Treatment  Gait belt    Activity Tolerance  Patient tolerated treatment well    Behavior During Therapy  [facility] for tasks assessed/performed       Past Medical History:  Diagnosis Date  . Allergy   . Arthritis   . Cataract   . Diabetes mellitus without complication ([place])   . Hypertension   . Macular degeneration syndrome    with edema---being treated.     Past Surgical History:  Procedure Laterality Date  . callous removal  Left 2017   located on 1st digit on L foot 2/2 diabetes  . EYE SURGERY      There were no vitals filed for this visit.  Subjective Assessment - 04/13/19 0724    Subjective  No new complaints. No falls or pain to report. Pt reports she has been doing short walks at home.    Patient is accompained by:  Family member   [name]   Pertinent History  Patient had a R CVA 02/2018 and was walking with a SPC prior to a fall in March 2020 resulting in being hospitalized with multiple pelvic fxs and a L hip fx. During hospital stay, patient acquired pressure ulcers on bilateral soles of feet near heels.Patient is now safely able to bear weight and pressure ulcers are now a stage 1 (per daughter report and photos).    Limitations  Standing;Walking;House hold activities    Patient Stated Goals  "Be back to normal, get back to walking, and  getting strength back"    Pain Score  0-No pain         [facility] PT Assessment - 04/13/19 0725      Standardized Balance Assessment   Standardized Balance Assessment  10 meter walk test;Berg Balance Test    10 Meter Walk  39.53 sec's= 0.83 sec's ft/sec with RW      Berg Balance Test   Sit to Stand  Able to stand  independently using hands    Standing Unsupported  Able to stand 2 minutes with supervision    Sitting with Back Unsupported but Feet Supported on Floor or Stool  Able to sit safely and securely 2 minutes    Stand to Sit  Sits safely with minimal use of hands    Transfers  Able to transfer safely, definite need of hands    Standing Unsupported with Eyes Closed  Able to stand 10 seconds with supervision    Standing Unsupported with Feet Together  Able to place feet together independently and stand for 1 minute with supervision    From Standing, Reach Forward with Outstretched Arm  Can reach forward >12 cm safely (5")   6 inches   From Standing Position, Pick up Object from Floor  Unable to pick  up shoe, but reaches 2-5 cm (1-2") from shoe and balances independently    From Standing Position, Turn to Look Behind Over each Shoulder  Looks behind one side only/other side shows less weight shift   righ> left   Turn 360 Degrees  Able to turn 360 degrees safely but slowly    Standing Unsupported, Alternately Place Feet on Step/Stool  Able to complete >2 steps/needs minimal assist   min HHA on right side   Standing Unsupported, One Foot in Front  Able to take small step independently and hold 30 seconds    Standing on One Leg  Tries to lift leg/unable to hold 3 seconds but remains standing independently    Total Score  37    Berg comment:  37/56= significant fall risk         [facility] Adult PT Treatment/Exercise - 04/13/19 0725      Transfers   Transfers  Sit to Stand;Stand to Sit    Sit to Stand  5: Supervision;With upper extremity assist;From bed;From chair/3-in-1    Stand to  Sit  5: Supervision;With upper extremity assist;To bed;To chair/3-in-1    Stand Pivot Transfers  5: Supervision    Stand Pivot Transfer Details (indicate cue type and reason)  with RW mat<>chair with cues on upright posture and sequencing.       Ambulation/Gait   Ambulation/Gait  Yes    Ambulation/Gait Assistance  4: Min guard    Ambulation/Gait Assistance Details  first gait rep was from lobby to mat table in gym with cues on posture. second gait rep was in gym with testing, cues on posture, walker position and step length with all gait.     Ambulation Distance (Feet)  100 Feet   x1, 80 x1   Assistive device  Rolling walker    Gait Pattern  Step-through pattern;Trunk flexed    Ambulation Surface  Level;Indoor          PT Short Term Goals - 03/19/19 1618      PT SHORT TERM GOAL #1   Title  Pt will demonstrate ability to perform ongoing HEP (stretching and standing exercises) with supervision    Baseline  03/17/19: pt reports consistently doing HEP with daugher, sometimes spouse, assistance    Status  Achieved    Target Date  03/20/19      PT SHORT TERM GOAL #2   Title  Patient will increase BERG score to at least a 30/56 in order to help decrease risk of future falls.    Baseline  03/17/19: 35/56 scored today    Status  Achieved    Target Date  03/20/19      PT SHORT TERM GOAL #3   Title  Pt will perform stand pivot transfers with rollator x 5 reps with distant supervision    Baseline  03/17/19: met with  supervision (PTA near wheelchair) with RW (does not use rollator), no physical assistance needed, only cues.    Status  Achieved    Target Date  03/20/19      PT SHORT TERM GOAL #4   Title  Pt will ambulate x 115' with LRAD on level, indoor surfaces with distant supervision    Baseline  min guard x 200' with rollator    Time  4    Period  Weeks    Status  Not Met    Target Date  03/20/19      PT SHORT TERM GOAL #5   Title  Pt  will improve gait velocity with LRAD to >/=  1.0 ft/sec    Baseline  03/17/19: 0.58 ft/sec, slower that last score of 0.71 ft/sec    Time  4    Period  Weeks    Status  Not Met    Target Date  03/20/19      PT SHORT TERM GOAL #6   Title  Patient will negotiate 4 stairs with 2 rails, step to sequence with supervision    Baseline  Patient needs CGA for safety    Time  4    Period  Weeks    Status  Partially Met    Target Date  03/20/19        PT Long Term Goals - 04/13/19 0726      PT LONG TERM GOAL #1   Title  Patient will report performing HEP atleast 5 days a week with husband's supervision    Baseline  04/13/19: met with current program    Status  Achieved      PT LONG TERM GOAL #2   Title  Pt will ambulate 230' with LRAD over indoor surfaces with supervision and will report ambulating short distances at home with LRAD and husband's supervision    Time  8    Period  Weeks    Status  On-going      PT LONG TERM GOAL #3   Title  Patient will increase BERG score to at least 34/56 in order to decrease risk of future falls.    Baseline  04/13/19: 37/56 scored today    Time  --    Period  --    Status  Achieved      PT LONG TERM GOAL #4   Title  Pt will perform sit <> stand and stand pivots with LRAD MOD I    Baseline  04/13/19: supervision for sit<>stand and stand pivot tranfers on consistent basis, improved from occasional need of min assist    Time  --    Period  --    Status  Partially Met      PT LONG TERM GOAL #5   Title  Patient will increase gait speed to at least 1.8t/sec with LRAD in order to decrease risk of falls and improve household ambulation.    Baseline  04/13/19: 0.83 ft/sec with RW with supervision, improved from 0.71 f/tsec just not to goal    Time  --    Period  --    Status  Partially Met      PT LONG TERM GOAL #6   Title  Pt will ambulate x 100' outside over pavement with LRAD and supervision-min A    Time  8    Period  Weeks    Status  On-going            Plan - 04/13/19 0725     Clinical Impression Statement  Today's skilled session began to assess progress toward LTGs with pt meeting the HEP goal and Berg Balance Test goal with a score of 37/56. Pt also showed improvment on consecutive gait distance this session, gait speed and needed decreased assistance from last goal check. Will plan to assess remaining LTGs next session.    PT Treatment/Interventions  ADLs/Self Care Home Management;Gait training;DME Instruction;Stair training;Functional mobility training;Therapeutic activities;Patient/family education;Neuromuscular re-education;Balance training;Therapeutic exercise;Passive range of motion;Orthotic Fit/Training    PT Next Visit Plan  assess remaining LTGs for anticipated recert and send to primary PT; WALKING TO/FROM TREATMENT AREA FROM WAITING  AREA -PROGRESS AWAY FROM W/C.  Step ups to L and use of LUE and LLE to PUSH to L; Progress standing tolerance, work on taking items out of microwave (work with OT to not duplicate), standing LE strengthening, posture, functional mobility.  Emphasize carryover to home environment with small increases in mobility.    PT Home Exercise Plan  [id number]    Consulted and Agree with Plan of Care  Patient;Family member/caregiver    Family Member Consulted  daughter, [name]       Patient will benefit from skilled therapeutic intervention in order to improve the following deficits and impairments:  Abnormal gait, Decreased activity tolerance, Decreased balance, Decreased range of motion, Decreased mobility, Decreased endurance, Decreased skin integrity, Decreased strength, Difficulty walking, Impaired perceived functional ability, Postural dysfunction  Visit Diagnosis: Unsteadiness on feet  Muscle weakness (generalized)  Other abnormalities of gait and mobility  Hemiplegia and hemiparesis following cerebral infarction affecting left non-dominant side [facility])     Problem List Patient Active Problem List   Diagnosis Date Noted  . Iron  deficiency anemia 02/03/2019  . Pelvic fracture ([place]) 09/21/2018  . Allergic reaction caused by a drug 05/06/2018  . Bilateral leg edema 04/13/2018  . HLD (hyperlipidemia) 03/23/2018  . Mood swings 03/23/2018  . Chronic kidney disease (CKD), stage III (moderate)   . Embolic stroke ([place]) 16/04/9603  . Left hemiparesis ([place])   . Diabetes mellitus type 2 in nonobese ([facility])   . Acute ischemic stroke ([place])   . Left arm weakness   . Acute CVA (cerebrovascular accident) ([place]) 02/24/2018  . Overweight (BMI 25.0-29.9) 02/24/2018  . CKD (chronic kidney disease), stage III 02/24/2018  . Diabetes mellitus without complication ([place]) 54/03/8118  . HTN (hypertension) 09/02/2017    Willow Ora, PTA, [facility] [address], [place], [place] [address] [phone] 04/13/19, 9:37 PM   Name: [name] MRN: [id number] Date of Birth: 1946-05-17

## 2019-04-13 NOTE — Therapy (Signed)
[facility] [address] [place], [place], [address] Phone: [phone]   Fax:  [phone]  Occupational Therapy Treatment  Patient Details  Name: [name] MRN: [id number] Date of Birth: 08-Jun-1946 Referring Provider (OT): Dr. Letta Pate   Encounter Date: 04/13/2019  OT End of Session - 04/13/19 0853    Visit Number  29    Number of Visits  32    Date for OT Re-Evaluation  04/20/19    Authorization Type  [employer] MCR    Authorization Time Period  cert. period 02/19/19-05/20/19    Authorization - Visit Number  29    Authorization - Number of Visits  30    OT Start Time  0802    OT Stop Time  0847    OT Time Calculation (min)  45 min    Activity Tolerance  Patient tolerated treatment well    Behavior During Therapy  [facility] for tasks assessed/performed       Past Medical History:  Diagnosis Date  . Allergy   . Arthritis   . Cataract   . Diabetes mellitus without complication ([place])   . Hypertension   . Macular degeneration syndrome    with edema---being treated.     Past Surgical History:  Procedure Laterality Date  . callous removal  Left 2017   located on 1st digit on L foot 2/2 diabetes  . EYE SURGERY      There were no vitals filed for this visit.  Subjective Assessment - 04/13/19 0803    Subjective   Daughter reports she got botox in her arm about a week ago (on 04/06/19) and requested it to be stretched    Patient is accompanied by:  Family member   daughter   Pertinent History  CVA 02/27/18, pelvic fx and Lt hip  fx 09/2018. PMH: HTN, OA    Limitations  Fall risk, wounds on feet    Currently in Pain?  No/denies       Pt's daughter politely requested LUE to be stretched due to recent botox injections. Pt was placed supine and stretched arm in flexion, abduction, and abduction w/ ER as tolerated for pects stretch. Progressed to bilateral UE sh flexion with thick foam (palms facing) and with 1 lb  weight.  Sidelying: worked on AA/ROM in abduction w/ min facilitation to scapula/trunk Seated: AA/ROM in sh flexion on diagonal surface (elevated) and then in abduction flat surface followed by functional low range reaching to encourage forearm neutral rotation/supination and proper hand orientation Pt/daughter shown stretch w/ arms slightly abducted and slightly in extension w/ light wt bearing for trunk extension, scapula depression, pects stretch, and wt bearing through UE's                      OT Short Term Goals - 03/25/19 1108      OT SHORT TERM GOAL #1   Title  Independent with initial HEP for shoulder ROM, and functional reaching LUE    Time  4    Period  Weeks    Status  Achieved      OT SHORT TERM GOAL #2   Title  Pt to perform UB dressing with set up only and LE dressing with mod assist or less using A/E prn    Time  4    Period  Weeks    Status  Achieved      OT SHORT TERM GOAL #3   Title  Pt to increase LUE functional use as evidenced by performing 15 blocks on Box & Blocks test.--STGs due 03/21/19   (modified 03/19/19)    Baseline  11 blocks    Time  4    Period  Weeks    Status  Revised   01/27/19:  11 blocks, 02/17/19:  13, 03/19/19 10 blocks     OT SHORT TERM GOAL #4   Title  Pt to perform toileting/clothes management with only min assist    Period  Weeks    Status  Achieved   Able to complete - simulation -  in clinic.  01/27/19:  met per pt/dtr report     OT SHORT TERM GOAL #5   Title  Pt to achieve 90* shoulder flexion LUE for mid level reaching    Time  4    Period  Weeks    Status  Not Met   01/18/19:  75* with min compensation.  02/17/19:  85* with min compensation.  03/09/19: inconsistent. 03/25/19: 85* w/ compensations       OT Long Term Goals - 03/25/19 1110      OT LONG TERM GOAL #1   Title  Independent with updated HEP - due 04/20/19    Time  8    Period  Weeks    Status  On-going      OT LONG TERM GOAL #2   Title  Pt to be mod  I with all BADLS using A/E as needed (except shower transfers to still be supervision)    Time  8    Period  Weeks    Status  On-going   02/17/19:  assist for compression stockings, min A for depends, supervision for shower transfer (mod I for bathing), toileting with BSC mod I     OT LONG TERM GOAL #3   Title  Pt to improve LUE function as evidenced by performing 19 blocks or greater on Box & Blocks test--revised 03/19/19    Time  8    Period  Weeks    Status  Revised   02/17/19:  13     OT LONG TERM GOAL #4   Title  Pt to improve coordination as evidenced by performing 9 hole peg test completely in 90 sec or less    Baseline  placed 5 in 2 min.    Time  8    Period  Weeks    Status  On-going   02/17/19:  5 pegs within 62mn.  completed in 485m 43sec     OT LONG TERM GOAL #5   Title  Grip strength Lt hand to be 15 lbs or greater    Baseline  8 lbs    Time  8    Period  Weeks    Status  On-going   02/17/19:  5lbs     OT LONG TERM GOAL #6   Title  Pt to perform simple snack, cold food, and microwaveable food mod I level with DME prn without LOB    Time  8    Period  Weeks    Status  On-going   02/17/19:  not performing. 03/25/19: performed in clinic making sandwich w/ close sup (partly from w/c level, mostly from standing)     OT LONG TERM GOAL #7   Title  Pt to achieve 110* or greater P/ROM Lt shoulder with pain less than or equal to 5/10    Time  8    Period  Weeks  Status  Achieved   02/17/19:  110-115* in sitting with 4-5/10 pain           Plan - 04/13/19 0854    Clinical Impression Statement  Pt with slow but steady progress toward goals. Pt wtih improved attention to LUE and functional use of LUE with familiar functional tasks.    Occupational performance deficits (Please refer to evaluation for details):  ADL's;IADL's;Leisure    Body Structure / Function / Physical Skills  ADL;ROM;Balance;IADL;Body mechanics;Improper spinal/pelvic  alignment;Mobility;Strength;Flexibility;FMC;Coordination;UE functional use;Proprioception;Decreased knowledge of use of DME    Rehab Potential  Good    OT Frequency  2x / week    OT Duration  8 weeks    OT Treatment/Interventions  Self-care/ADL training;Therapeutic exercise;Functional Mobility Training;Neuromuscular education;Aquatic Therapy;Manual Therapy;Splinting;Therapeutic activities;Paraffin;DME and/or AE instruction;Cognitive remediation/compensation;Electrical Stimulation;Visual/perceptual remediation/compensation;Moist Heat;Passive range of motion;Patient/family education    Plan  10th progress note due! Consider renewal next week (vs d/c)  if pt continues to show steady slow progress d/t recent botox injections to gain more low range functional use of LUE and more functional standing tolerance    Consulted and Agree with Plan of Care  Patient;Family member/caregiver    Family Member Consulted  daughter       Patient will benefit from skilled therapeutic intervention in order to improve the following deficits and impairments:   Body Structure / Function / Physical Skills: ADL, ROM, Balance, IADL, Body mechanics, Improper spinal/pelvic alignment, Mobility, Strength, Flexibility, FMC, Coordination, UE functional use, Proprioception, Decreased knowledge of use of DME       Visit Diagnosis: Hemiplegia and hemiparesis following cerebral infarction affecting left non-dominant side (HCC)  Stiffness of left shoulder, not elsewhere classified    Problem List Patient Active Problem List   Diagnosis Date Noted  . Iron deficiency anemia 02/03/2019  . Pelvic fracture ([place]) 09/21/2018  . Allergic reaction caused by a drug 05/06/2018  . Bilateral leg edema 04/13/2018  . HLD (hyperlipidemia) 03/23/2018  . Mood swings 03/23/2018  . Chronic kidney disease (CKD), stage III (moderate)   . Embolic stroke ([place]) 23/41/4436  . Left hemiparesis ([place])   . Diabetes mellitus type 2 in nonobese (HCC)    . Acute ischemic stroke ([place])   . Left arm weakness   . Acute CVA (cerebrovascular accident) ([place]) 02/24/2018  . Overweight (BMI 25.0-29.9) 02/24/2018  . CKD (chronic kidney disease), stage III 02/24/2018  . Diabetes mellitus without complication ([place]) 01/65/8006  . HTN (hypertension) 09/02/2017    Carey Bullocks, OTR/L 04/13/2019, 9:02 AM  [facility] [address] [place], [place], [address] Phone: [phone]   Fax:  [phone]  Name: [name] MRN: [id number] Date of Birth: [birth year]

## 2019-04-16 ENCOUNTER — Ambulatory Visit (INDEPENDENT_AMBULATORY_CARE_PROVIDER_SITE_OTHER): Payer: Medicare Other | Admitting: Family Medicine

## 2019-04-16 ENCOUNTER — Other Ambulatory Visit: Payer: Self-pay

## 2019-04-16 ENCOUNTER — Other Ambulatory Visit: Payer: Self-pay | Admitting: Family Medicine

## 2019-04-16 ENCOUNTER — Encounter: Payer: Self-pay | Admitting: Physical Therapy

## 2019-04-16 ENCOUNTER — Encounter: Payer: Self-pay | Admitting: Family Medicine

## 2019-04-16 ENCOUNTER — Ambulatory Visit: Payer: Medicare Other | Admitting: Physical Therapy

## 2019-04-16 DIAGNOSIS — R2689 Other abnormalities of gait and mobility: Secondary | ICD-10-CM

## 2019-04-16 DIAGNOSIS — M6281 Muscle weakness (generalized): Secondary | ICD-10-CM | POA: Diagnosis not present

## 2019-04-16 DIAGNOSIS — K5720 Diverticulitis of large intestine with perforation and abscess without bleeding: Secondary | ICD-10-CM | POA: Insufficient documentation

## 2019-04-16 DIAGNOSIS — M25512 Pain in left shoulder: Secondary | ICD-10-CM | POA: Diagnosis not present

## 2019-04-16 DIAGNOSIS — M25612 Stiffness of left shoulder, not elsewhere classified: Secondary | ICD-10-CM | POA: Diagnosis not present

## 2019-04-16 DIAGNOSIS — I69354 Hemiplegia and hemiparesis following cerebral infarction affecting left non-dominant side: Secondary | ICD-10-CM

## 2019-04-16 DIAGNOSIS — G8929 Other chronic pain: Secondary | ICD-10-CM | POA: Diagnosis not present

## 2019-04-16 DIAGNOSIS — R278 Other lack of coordination: Secondary | ICD-10-CM | POA: Diagnosis not present

## 2019-04-16 DIAGNOSIS — R2681 Unsteadiness on feet: Secondary | ICD-10-CM | POA: Diagnosis not present

## 2019-04-16 MED ORDER — AMOXICILLIN-POT CLAVULANATE 875-125 MG PO TABS: 1.0000 | ORAL_TABLET | Freq: Two times a day (BID) | ORAL | 0 refills | Status: DC

## 2019-04-16 MED ORDER — METRONIDAZOLE 500 MG PO TABS: 500.0000 mg | ORAL_TABLET | Freq: Three times a day (TID) | ORAL | 0 refills | Status: DC

## 2019-04-16 NOTE — Progress Notes (Signed)
Established Patient Office Visit  Subjective:  Patient ID: [name], female    DOB: [birth year]  Age: 73 y.o. MRN: [id number]  CC:  Chief Complaint  Patient presents with  . stomach issues    HPI [name] presents for evaluation treatment of a 2-week history of slowly evolving abdominal pain.  More recently there has been loose stool.  Patient denies fevers chills nausea or vomiting or blood in the stool.  Patient is currently tolerating a soft/liquid diet.  CT of pelvis back in March for a pelvic fracture demonstrated diverticulosis.  Patient was in therapy today for rehabilitation of her pelvic fracture and could not complete this session because of the pain.  Her daughter is present for the interview.  Past Medical History:  Diagnosis Date  . Allergy   . Arthritis   . Cataract   . Diabetes mellitus without complication ([place])   . Hypertension   . Macular degeneration syndrome    with edema---being treated.     Past Surgical History:  Procedure Laterality Date  . callous removal  Left 2017   located on 1st digit on L foot 2/2 diabetes  . EYE SURGERY      Family History  Problem Relation Age of Onset  . Kidney disease Mother   . Congestive Heart Failure Father   . COPD Father   . COPD Brother   . Diabetes Maternal Aunt     Social History   Socioeconomic History  . Marital status: Married    Spouse name: Rennis Harding  . Number of children: 2  . 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 Highest education level: High school graduate  Occupational History  . Not on file  Social Needs  . Financial resource strain: Somewhat hard  . Food insecurity    Worry: Never true    Inability: Never true  . Transportation needs    Medical: No    Non-medical: No  Tobacco Use  . Smoking status: Never Smoker  . Smokeless tobacco: Never Used  Substance and Sexual Activity  . Alcohol use: No  . Drug use: No  . Sexual activity: Not  Currently  Lifestyle  . Physical activity    Days per week: 0 days    Minutes per session: 0 min  . Stress: Not at all  Relationships  . Social connections    Talks on phone: More than three times a week    Gets together: Twice a week    Attends religious service: Never    Active member of club or organization: No    Attends meetings of clubs or organizations: Never    Relationship status: Married  . Intimate partner violence    Fear of current or ex partner: No    Emotionally abused: No    Physically abused: No    Forced sexual activity: No  Other Topics Concern  . Not on file  Social History Narrative  . Not on file    Outpatient Medications Prior to Visit  Medication Sig Dispense Refill  . aspirin EC 81 MG tablet Take 81 mg by mouth daily.    Marland Kitchen BLACK CURRANT SEED OIL PO Take 1 capsule by mouth daily.    . blood glucose meter kit and supplies KIT Dispense based on patient and insurance preference. UAD bid for glucose monitoring Dx: E11.9 1 each 0  . fexofenadine (ALLEGRA) 180 MG tablet Take 180 mg by  mouth daily.    Marland Kitchen glucose blood test strip UAD bid for glucose monitoring Dx: E11.9 100 each 12  . Lancet Device MISC UAD bid for glucose monitoring Dx: E11.9 1 each prn  . losartan (COZAAR) 25 MG tablet TAKE 1 TABLET BY MOUTH EVERY DAY 90 tablet 1  . collagenase (SANTYL) ointment Apply 1 application topically daily. Measurement: 2 x 3 x 0.1cm, and 5 x 4 x 0.1cm B/L heels 15 g 5  . methocarbamol (ROBAXIN) 500 MG tablet Take 1 tablet (500 mg total) by mouth every 8 (eight) hours as needed for muscle spasms. 20 tablet 0  . oxycodone (OXY-IR) 5 MG capsule Take 1 capsule (5 mg total) by mouth every 4 (four) hours as needed. 30 capsule 0   No facility-administered medications prior to visit.     Allergies  Allergen Reactions  . Clonidine Anaphylaxis, Swelling and Other (See Comments)    Tongue swelling  . Norvasc [Amlodipine Besylate] Swelling    Edema and TONGUE swelling   .  Lisinopril Cough  . Amlodipine Anxiety, Nausea Only and Swelling  . Codeine Itching and Rash    ROS Review of Systems  Constitutional: Negative.   HENT: Negative.   Respiratory: Negative.   Cardiovascular: Negative.   Gastrointestinal: Positive for abdominal pain and diarrhea. Negative for anal bleeding, blood in stool, constipation, nausea, rectal pain and vomiting.  Genitourinary: Negative.   Musculoskeletal: Positive for gait problem.  Skin: Negative for pallor and rash.  Neurological: Negative for syncope and speech difficulty.  Hematological: Does not bruise/bleed easily.  Psychiatric/Behavioral: Negative.       Objective:    Physical Exam  Constitutional: She is oriented to person, place, and time. She appears well-developed and well-nourished. No distress.  HENT:  Head: Normocephalic and atraumatic.  Right Ear: External ear normal.  Left Ear: External ear normal.  Eyes: Conjunctivae are normal. Right eye exhibits no discharge. Left eye exhibits no discharge. No scleral icterus.  Neck: No tracheal deviation present.  Pulmonary/Chest: Effort normal. No stridor.  Abdominal:    Neurological: She is alert and oriented to person, place, and time.  Skin: She is not diaphoretic.  Psychiatric: She has a normal mood and affect. Her behavior is normal.    There were no vitals taken for this visit. Wt Readings from Last 3 Encounters:  04/06/19 165 lb (74.8 kg)  03/01/19 165 lb (74.8 kg)  02/03/19 168 lb (76.2 kg)   BP Readings from Last 3 Encounters:  04/06/19 (!) 193/78  03/01/19 (!) 171/84  02/03/19 (!) 160/64   Guideline developer:  UpToDate (see UpToDate for funding source) Date Released: June 2014  Health Maintenance Due  Topic Date Due  . Hepatitis C Screening  26-May-1946  . TETANUS/TDAP  06/04/1964  . MAMMOGRAM  01/02/2005  . COLONOSCOPY  01/02/2011  . PNA vac Low Risk Adult (2 of 2 - PPSV23) 02/21/2016  . INFLUENZA VACCINE  02/06/2019  . OPHTHALMOLOGY  EXAM  02/18/2019    There are no preventive care reminders to display for this patient.  Lab Results  Component Value Date   TSH 4.082 02/24/2018   Lab Results  Component Value Date   WBC 7.6 02/03/2019   HGB 10.9 (L) 02/03/2019   HCT 32.6 (L) 02/03/2019   MCV 86.5 02/03/2019   PLT 263 02/03/2019   Lab Results  Component Value Date   NA 137 02/03/2019   NA 137 02/03/2019   K 4.3 02/03/2019   K 4.3 02/03/2019  CO2 22 02/03/2019   CO2 22 02/03/2019   GLUCOSE 195 (H) 02/03/2019   GLUCOSE 195 (H) 02/03/2019   BUN 33 (H) 02/03/2019   BUN 33 (H) 02/03/2019   CREATININE 1.65 (H) 02/03/2019   CREATININE 1.65 (H) 02/03/2019   BILITOT 0.3 02/03/2019   ALKPHOS 102 05/25/2018   AST 13 02/03/2019   ALT 16 02/03/2019   PROT 5.8 (L) 02/03/2019   ALBUMIN 4.2 05/25/2018   CALCIUM 9.2 02/03/2019   CALCIUM 9.2 02/03/2019   ANIONGAP 7 09/25/2018   GFR 32.64 (L) 05/25/2018   Lab Results  Component Value Date   CHOL 189 03/23/2018   Lab Results  Component Value Date   HDL 42.30 03/23/2018   Lab Results  Component Value Date   LDLCALC 116 (H) 03/23/2018   Lab Results  Component Value Date   TRIG 151.0 (H) 03/23/2018   Lab Results  Component Value Date   CHOLHDL 4 03/23/2018   Lab Results  Component Value Date   HGBA1C 6.6 (H) 02/03/2019      Assessment & Plan:   Problem List Items Addressed This Visit      Digestive   Diverticulitis of large intestine with perforation without abscess or bleeding - Primary   Relevant Medications   amoxicillin-clavulanate (AUGMENTIN) 875-125 MG tablet   metroNIDAZOLE (FLAGYL) 500 MG tablet      Meds ordered this encounter  Medications  . amoxicillin-clavulanate (AUGMENTIN) 875-125 MG tablet    Sig: Take 1 tablet by mouth 2 (two) times daily.    Dispense:  20 tablet    Refill:  0  . metroNIDAZOLE (FLAGYL) 500 MG tablet    Sig: Take 1 tablet (500 mg total) by mouth 3 (three) times daily.    Dispense:  21 tablet     Refill:  0    Follow-up: Return if symptoms worsen or fail to improve.   Patient will continue a soft diet with a low glycemic index.  She will be certain to take her antibiotics with food.  Consider emergent evaluation if not improving with antibiotic therapy otherwise follow-up with Dr. Marjory Lies in 2 weeks.  Information available on my chart about diverticulitis.  Virtual Visit via Video Note  I connected with Rande Brunt on 04/16/19 at 10:30 AM EDT by a video enabled telemedicine application and verified that I am speaking with the correct person using two identifiers.  Location: Patient: home Provider:    I discussed the limitations of evaluation and management by telemedicine and the availability of in person appointments. The patient expressed understanding and agreed to proceed.  History of Present Illness:    Observations/Objective:   Assessment and Plan:   Follow Up Instructions:    I discussed the assessment and treatment plan with the patient. The patient was provided an opportunity to ask questions and all were answered. The patient agreed with the plan and demonstrated an understanding of the instructions.   The patient was advised to call back or seek an in-person evaluation if the symptoms worsen or if the condition fails to improve as anticipated.  I provided 20 minutes of non-face-to-face time during this encounter.   Libby Maw, MD

## 2019-04-16 NOTE — Patient Instructions (Signed)
Diverticulitis  Diverticulitis is infection or inflammation of small pouches (diverticula) in the colon that form due to a condition called diverticulosis. Diverticula can trap stool (feces) and bacteria, causing infection and inflammation. Diverticulitis may cause severe stomach pain and diarrhea. It may lead to tissue damage in the colon that causes bleeding. The diverticula may also burst (rupture) and cause infected stool to enter other areas of the abdomen. Complications of diverticulitis can include:  Bleeding.  Severe infection.  Severe pain.  Rupture (perforation) of the colon.  Blockage (obstruction) of the colon. What are the causes? This condition is caused by stool becoming trapped in the diverticula, which allows bacteria to grow in the diverticula. This leads to inflammation and infection. What increases the risk? You are more likely to develop this condition if:  You have diverticulosis. The risk for diverticulosis increases if: ? You are overweight or obese. ? You use tobacco products. ? You do not get enough exercise.  You eat a diet that does not include enough fiber. High-fiber foods include fruits, vegetables, beans, nuts, and whole grains. What are the signs or symptoms? Symptoms of this condition may include:  Pain and tenderness in the abdomen. The pain is normally located on the left side of the abdomen, but it may occur in other areas.  Fever and chills.  Bloating.  Cramping.  Nausea.  Vomiting.  Changes in bowel routines.  Blood in your stool. How is this diagnosed? This condition is diagnosed based on:  Your medical history.  A physical exam.  Tests to make sure there is nothing else causing your condition. These tests may include: ? Blood tests. ? Urine tests. ? Imaging tests of the abdomen, including X-rays, ultrasounds, MRIs, or CT scans. How is this treated? Most cases of this condition are mild and can be treated at home.  Treatment may include:  Taking over-the-counter pain medicines.  Following a clear liquid diet.  Taking antibiotic medicines by mouth.  Rest. More severe cases may need to be treated at a hospital. Treatment may include:  Not eating or drinking.  Taking prescription pain medicine.  Receiving antibiotic medicines through an IV tube.  Receiving fluids and nutrition through an IV tube.  Surgery. When your condition is under control, your health care provider may recommend that you have a colonoscopy. This is an exam to look at the entire large intestine. During the exam, a lubricated, bendable tube is inserted into the anus and then passed into the rectum, colon, and other parts of the large intestine. A colonoscopy can show how severe your diverticula are and whether something else may be causing your symptoms. Follow these instructions at home: Medicines  Take over-the-counter and prescription medicines only as told by your health care provider. These include fiber supplements, probiotics, and stool softeners.  If you were prescribed an antibiotic medicine, take it as told by your health care provider. Do not stop taking the antibiotic even if you start to feel better.  Do not drive or use heavy machinery while taking prescription pain medicine. General instructions   Follow a full liquid diet or another diet as directed by your health care provider. After your symptoms improve, your health care provider may tell you to change your diet. He or she may recommend that you eat a diet that contains at least 25 g (25 grams) of fiber daily. Fiber makes it easier to pass stool. Healthy sources of fiber include: ? Berries. One cup contains 4-8 grams of   fiber. ? Beans or lentils. One half cup contains 5-8 grams of fiber. ? Green vegetables. One cup contains 4 grams of fiber.  Exercise for at least 30 minutes, 3 times each week. You should exercise hard enough to raise your heart rate and  break a sweat.  Keep all follow-up visits as told by your health care provider. This is important. You may need a colonoscopy. Contact a health care provider if:  Your pain does not improve.  You have a hard time drinking or eating food.  Your bowel movements do not return to normal. Get help right away if:  Your pain gets worse.  Your symptoms do not get better with treatment.  Your symptoms suddenly get worse.  You have a fever.  You vomit more than one time.  You have stools that are bloody, black, or tarry. Summary  Diverticulitis is infection or inflammation of small pouches (diverticula) in the colon that form due to a condition called diverticulosis. Diverticula can trap stool (feces) and bacteria, causing infection and inflammation.  You are at higher risk for this condition if you have diverticulosis and you eat a diet that does not include enough fiber.  Most cases of this condition are mild and can be treated at home. More severe cases may need to be treated at a hospital.  When your condition is under control, your health care provider may recommend that you have an exam called a colonoscopy. This exam can show how severe your diverticula are and whether something else may be causing your symptoms. This information is not intended to replace advice given to you by your health care provider. Make sure you discuss any questions you have with your health care provider. Document Released: 04/03/2005 Document Revised: 06/06/2017 Document Reviewed: 07/27/2016 Elsevier Patient Education  2020 [employer]  

## 2019-04-16 NOTE — Telephone Encounter (Signed)
Please advise on pt's instructions. We have in our system pt taking one tab daily but this rx states for twice a day

## 2019-04-16 NOTE — Therapy (Addendum)
[facility] [address] [place], [place], [address] Phone: [phone]   Fax:  [phone]  Physical Therapy Treatment  Patient Details  Name: [name] MRN: [id number] Date of Birth: [birth year] Referring Provider (PT): Alysia Penna, MD   Encounter Date: 04/16/2019  PT End of Session - 04/16/19 0727    Visit Number  40    Number of Visits  40    Date for PT Re-Evaluation  04/19/19    Authorization Type  [employer] Medicare, 10th visit progress note    PT Start Time  0718    PT Stop Time  0746   ended due to pt not feeling well   PT Time Calculation (min)  28 min    Equipment Utilized During Treatment  Gait belt    Activity Tolerance  Patient tolerated treatment well    Behavior During Therapy  [facility] for tasks assessed/performed       Past Medical History:  Diagnosis Date  . Allergy   . Arthritis   . Cataract   . Diabetes mellitus without complication ([place])   . Hypertension   . Macular degeneration syndrome    with edema---being treated.     Past Surgical History:  Procedure Laterality Date  . callous removal  Left 2017   located on 1st digit on L foot 2/2 diabetes  . EYE SURGERY      There were no vitals filed for this visit.  Subjective Assessment - 04/16/19 0726    Subjective  No new complaints. No falls or pain to report. Walking is going well at home.    Patient is accompained by:  Family member   [name]   Pertinent History  Patient had a R CVA 02/2018 and was walking with a SPC prior to a fall in March 2020 resulting in being hospitalized with multiple pelvic fxs and a L hip fx. During hospital stay, patient acquired pressure ulcers on bilateral soles of feet near heels.Patient is now safely able to bear weight and pressure ulcers are now a stage 1 (per daughter report and photos).    Limitations  Standing;Walking;House hold activities    Patient Stated Goals  "Be back to normal, get  back to walking, and getting strength back"    Currently in Pain?  No/denies    Pain Score  0-No pain            [facility] Adult PT Treatment/Exercise - 04/16/19 0728      Transfers   Transfers  Sit to Stand;Stand to Sit    Sit to Stand  5: Supervision;With upper extremity assist;From bed;From chair/3-in-1    Stand to Sit  5: Supervision;With upper extremity assist;To bed;To chair/3-in-1    Comments  increased time needed as pt fatigued.       Ambulation/Gait   Ambulation/Gait  Yes    Ambulation/Gait Assistance  4: Min guard    Ambulation/Gait Assistance Details  1st lap from lobby to gym with supervision, 2cd lap around gym with supervision to min guard assist as pt fatigued. on 3rd lap pt suddenly stopping with tears forming in her eyes, needing to sit down. Pt with c/o's of left side pain stating her diverticulitis is acting up. Daughter had already reported she has been having a rough few days due to a flareup. Pt requesting to end session at thist time. With all gait reps pt needed cues for posture, fwd gaze, walker position and for increased step length with  gait.    Ambulation Distance (Feet)  80 Feet   x1, 115 x1, 92 x1   Assistive device  Rolling walker    Gait Pattern  Step-through pattern;Trunk flexed    Ambulation Surface  Level;Indoor          PT Short Term Goals - 03/19/19 1618      PT SHORT TERM GOAL #1   Title  Pt will demonstrate ability to perform ongoing HEP (stretching and standing exercises) with supervision    Baseline  03/17/19: pt reports consistently doing HEP with daugher, sometimes spouse, assistance    Status  Achieved    Target Date  03/20/19      PT SHORT TERM GOAL #2   Title  Patient will increase BERG score to at least a 30/56 in order to help decrease risk of future falls.    Baseline  03/17/19: 35/56 scored today    Status  Achieved    Target Date  03/20/19      PT SHORT TERM GOAL #3   Title  Pt will perform stand pivot transfers with  rollator x 5 reps with distant supervision    Baseline  03/17/19: met with  supervision (PTA near wheelchair) with RW (does not use rollator), no physical assistance needed, only cues.    Status  Achieved    Target Date  03/20/19      PT SHORT TERM GOAL #4   Title  Pt will ambulate x 115' with LRAD on level, indoor surfaces with distant supervision    Baseline  min guard x 200' with rollator    Time  4    Period  Weeks    Status  Not Met    Target Date  03/20/19      PT SHORT TERM GOAL #5   Title  Pt will improve gait velocity with LRAD to >/= 1.0 ft/sec    Baseline  03/17/19: 0.58 ft/sec, slower that last score of 0.71 ft/sec    Time  4    Period  Weeks    Status  Not Met    Target Date  03/20/19      PT SHORT TERM GOAL #6   Title  Patient will negotiate 4 stairs with 2 rails, step to sequence with supervision    Baseline  Patient needs CGA for safety    Time  4    Period  Weeks    Status  Partially Met    Target Date  03/20/19        PT Long Term Goals - 04/13/19 0726      PT LONG TERM GOAL #1   Title  Patient will report performing HEP atleast 5 days a week with husband's supervision    Baseline  04/13/19: met with current program    Status  Achieved      PT LONG TERM GOAL #2   Title  Pt will ambulate 230' with LRAD over indoor surfaces with supervision and will report ambulating short distances at home with LRAD and husband's supervision    Time  8    Period  Weeks    Status  On-going      PT LONG TERM GOAL #3   Title  Patient will increase BERG score to at least 34/56 in order to decrease risk of future falls.    Baseline  04/13/19: 37/56 scored today    Time  --    Period  --    Status  Achieved  PT LONG TERM GOAL #4   Title  Pt will perform sit <> stand and stand pivots with LRAD MOD I    Baseline  04/13/19: supervision for sit<>stand and stand pivot tranfers on consistent basis, improved from occasional need of min assist    Time  --    Period  --     Status  Partially Met      PT LONG TERM GOAL #5   Title  Patient will increase gait speed to at least 1.8t/sec with LRAD in order to decrease risk of falls and improve household ambulation.    Baseline  04/13/19: 0.83 ft/sec with RW with supervision, improved from 0.71 f/tsec just not to goal    Time  --    Period  --    Status  Partially Met      PT LONG TERM GOAL #6   Title  Pt will ambulate x 100' outside over pavement with LRAD and supervision-min A    Time  8    Period  Weeks    Status  On-going        new goals for recertification: PT Short Term Goals - 04/17/19 1231      PT SHORT TERM GOAL #1   Title  Pt will demonstrate ability to perform ongoing HEP (stretching and standing exercises) with husband's supervision    Status  Revised    Target Date  05/17/19      PT SHORT TERM GOAL #2   Title  Patient will increase BERG score to at least a 40/56 in order to help decrease risk of future falls.    Baseline  37/56    Status  Revised    Target Date  05/17/19      PT SHORT TERM GOAL #3   Title  Pt will consistently ambulate with RW to/from treatment area with supervision    Time  4    Period  Weeks    Status  New    Target Date  05/17/19      PT SHORT TERM GOAL #4   Title  Pt will improve gait velocity with LRAD to >/= 1.0 ft/sec    Baseline  .35 ft/sec with RW    Time  4    Period  Weeks    Status  Revised    Target Date  05/17/19      PT SHORT TERM GOAL #5   Title  Patient will negotiate 4 stairs with 2 rails, step to sequence with supervision    Time  4    Period  Weeks    Status  New    Target Date  05/17/19      PT Long Term Goals - 04/16/19 0807      PT LONG TERM GOAL #1   Title  Patient will report performing HEP atleast 5 days a week with husband's supervision    Baseline  04/13/19: met with current program    Status  Revised    Target Date  06/16/19      PT LONG TERM GOAL #2   Title  Pt will ambulate 230' with LRAD over indoor surfaces with  supervision and will report ambulating to/from the bathroom with RW and supervision at home    Baseline  04/16/19: max distance of 115 feet before needed a rest break with supervision to min guard assist with RW    Time  8    Period  Weeks    Status  Revised    Target Date  06/16/19      PT LONG TERM GOAL #3   Title  Patient will increase BERG score to at least 44/56 in order to decrease risk of future falls.    Baseline  --    Time  8    Period  Weeks    Status  Revised    Target Date  06/16/19      PT LONG TERM GOAL #4   Title  Pt will perform sit <> stand and stand pivots with LRAD MOD I    Baseline  04/13/19: supervision for sit<>stand and stand pivot tranfers on consistent basis, improved from occasional need of min assist    Time  8    Period  Weeks    Status  Revised    Target Date  06/16/19      PT LONG TERM GOAL #5   Title  Patient will increase gait speed to at least 1.8t/sec with LRAD in order to decrease risk of falls and improve household ambulation.    Baseline  --    Time  8    Period  Weeks    Status  Revised    Target Date  06/16/19      PT LONG TERM GOAL #6   Title  Pt will ambulate from car <> clinic waiting area (with car pulled up to entrance) with RW and min guard leaving w/c completely in car    Baseline  --    Time  8    Period  Weeks    Status  New    Target Date  06/16/19      Rico Junker, PT, DPT 04/17/19    12:38 PM       Plan - 04/16/19 0727    Clinical Impression Statement  Today's skilled session focused on progress toward remaining LTGs for recertification. Pt has made progress toward her gait goal, did not meet it today. Unable to assess the other goal for outdoor gait today due to pt with medical issues needing to end session (diverticulits flareup). Primary PT to complete recert.  Addendum by primary PT: pt is making good progress towards goal and has met 2/6 LTG; pt was limited in her ability to complete all LTG assessment due to  abdominal pain and flare of diverticulitis.  Overall pt demonstrates improvements in static and dynamic standing balance, greater independence with transfers and standing tolerance without UE support, increased independence with household ambulation and overall endurance.  Pt continues to demonstrate impaired posture, impaired LE strength, fatigue quickly and continues to require use of w/c for longer distances and continues to be at significant risk for falls; will continue to benefit from skilled PT services to address to maximize independent mobility and decrease falls risk.    PT Frequency  2x / week    PT Duration  8 weeks    PT Treatment/Interventions  ADLs/Self Care Home Management;Gait training;DME Instruction;Stair training;Functional mobility training;Therapeutic activities;Patient/family education;Neuromuscular re-education;Balance training;Therapeutic exercise;Passive range of motion;Orthotic Fit/Training    PT Next Visit Plan  WALKING TO/FROM TREATMENT AREA FROM WAITING AREA -PROGRESS AWAY FROM W/C.  Step ups to L and use of LUE and LLE to PUSH to L; Progress standing tolerance, work on taking items out of microwave (work with OT to not duplicate), standing LE strengthening, posture, functional mobility.  Emphasize carryover to home environment with small increases in mobility.    PT Home Exercise Plan  [id number]  Consulted and Agree with Plan of Care  Patient;Family member/caregiver    Family Member Consulted  daughter, Lenore Cordia, [place], DPT 04/17/19    12:38 PM     Patient will benefit from skilled therapeutic intervention in order to improve the following deficits and impairments:  Abnormal gait, Decreased activity tolerance, Decreased balance, Decreased range of motion, Decreased mobility, Decreased endurance, Decreased skin integrity, Decreased strength, Difficulty walking, Impaired perceived functional ability, Postural dysfunction  Visit Diagnosis: Hemiplegia and  hemiparesis following cerebral infarction affecting left non-dominant side (HCC)  Unsteadiness on feet  Muscle weakness (generalized)  Other abnormalities of gait and mobility     Problem List Patient Active Problem List   Diagnosis Date Noted  . Iron deficiency anemia 02/03/2019  . Pelvic fracture ([place]) 09/21/2018  . Allergic reaction caused by a drug 05/06/2018  . Bilateral leg edema 04/13/2018  . HLD (hyperlipidemia) 03/23/2018  . Mood swings 03/23/2018  . Chronic kidney disease (CKD), stage III (moderate)   . Embolic stroke ([place]) 05/23/5207  . Left hemiparesis ([place])   . Diabetes mellitus type 2 in nonobese (HCC)   . Acute ischemic stroke ([place])   . Left arm weakness   . Acute CVA (cerebrovascular accident) ([place]) 02/24/2018  . Overweight (BMI 25.0-29.9) 02/24/2018  . CKD (chronic kidney disease), stage III 02/24/2018  . Diabetes mellitus without complication ([place]) 08/30/3610  . HTN (hypertension) 09/02/2017    Willow Ora, PTA, [facility] [address], [place], [place] [address] [phone] 04/16/19, 8:07 AM   Rico Junker, PT, DPT 04/17/19    12:38 PM     Name: MARTRICE APT MRN: [id number] Date of Birth: 10-Apr-1946

## 2019-04-17 NOTE — Addendum Note (Signed)
Addended by: Misty Stanley F on: 04/17/2019 12:40 PM   Modules accepted: Orders

## 2019-04-20 ENCOUNTER — Ambulatory Visit: Payer: Medicare Other | Admitting: Occupational Therapy

## 2019-04-20 ENCOUNTER — Other Ambulatory Visit: Payer: Self-pay

## 2019-04-20 ENCOUNTER — Telehealth: Payer: Self-pay | Admitting: Physical Therapy

## 2019-04-20 ENCOUNTER — Encounter: Payer: Self-pay | Admitting: Physical Therapy

## 2019-04-20 ENCOUNTER — Ambulatory Visit: Payer: Medicare Other | Admitting: Physical Therapy

## 2019-04-20 DIAGNOSIS — R2681 Unsteadiness on feet: Secondary | ICD-10-CM

## 2019-04-20 DIAGNOSIS — G8929 Other chronic pain: Secondary | ICD-10-CM

## 2019-04-20 DIAGNOSIS — I69354 Hemiplegia and hemiparesis following cerebral infarction affecting left non-dominant side: Secondary | ICD-10-CM | POA: Diagnosis not present

## 2019-04-20 DIAGNOSIS — R278 Other lack of coordination: Secondary | ICD-10-CM

## 2019-04-20 DIAGNOSIS — R2689 Other abnormalities of gait and mobility: Secondary | ICD-10-CM

## 2019-04-20 DIAGNOSIS — M6281 Muscle weakness (generalized): Secondary | ICD-10-CM

## 2019-04-20 DIAGNOSIS — M25612 Stiffness of left shoulder, not elsewhere classified: Secondary | ICD-10-CM

## 2019-04-20 DIAGNOSIS — M25512 Pain in left shoulder: Secondary | ICD-10-CM | POA: Diagnosis not present

## 2019-04-20 NOTE — Therapy (Signed)
[facility] [address] [place], [place], [address] Phone: [phone]   Fax:  [phone]  Occupational Therapy Treatment  Patient Details  Name: [name] MRN: [id number] Date of Birth: 29-Jul-1945 Referring Provider (OT): Dr. Letta Pate   Encounter Date: 04/20/2019  OT End of Session - 04/20/19 1412    Visit Number  30    Number of Visits  38    Date for OT Re-Evaluation  06/07/19   Pt will most likely not be able to return for O.T. until 05/10/19   Authorization Type  [employer] MCR    Authorization - Visit Number  30    Authorization - Number of Visits  30    OT Start Time  0845    OT Stop Time  0930    OT Time Calculation (min)  45 min    Activity Tolerance  Patient tolerated treatment well    Behavior During Therapy  [facility] for tasks assessed/performed       Past Medical History:  Diagnosis Date  . Allergy   . Arthritis   . Cataract   . Diabetes mellitus without complication ([place])   . Hypertension   . Macular degeneration syndrome    with edema---being treated.     Past Surgical History:  Procedure Laterality Date  . callous removal  Left 2017   located on 1st digit on L foot 2/2 diabetes  . EYE SURGERY      There were no vitals filed for this visit.  Subjective Assessment - 04/20/19 0905    Subjective   The stretching you did to my arm last week felt good    Pertinent History  CVA 02/27/18, pelvic fx and Lt hip  fx 09/2018. PMH: HTN, OA    Limitations  Fall risk, wounds on feet    Currently in Pain?  No/denies       Assessed progress towards goals and discussed renewing for 2x/wk for 4 more weeks. See goal section for details on progress.  Worked on transferring from w/c to mat x 2 w/ only CGA for safety.  Sit-supine with mod assist - then performed passive ROM to LUE in flexion, abduction, and ER                       OT Short Term Goals - 04/20/19 1414      OT SHORT  TERM GOAL #1   Title  Independent with initial HEP for shoulder ROM, and functional reaching LUE    Time  4    Period  Weeks    Status  Achieved      OT SHORT TERM GOAL #2   Title  Pt to perform UB dressing with set up only and LE dressing with mod assist or less using A/E prn    Time  4    Period  Weeks    Status  Achieved      OT SHORT TERM GOAL #3   Title  Pt to increase LUE functional use as evidenced by performing 15 blocks on Box & Blocks test.--STGs due 03/21/19   (modified 03/19/19)    Baseline  11 blocks    Time  4    Period  Weeks    Status  Achieved   01/27/19:  11 blocks, 02/17/19:  13, 03/19/19 10 blocks, 04/20/19 15 blocks     OT SHORT TERM GOAL #4   Title  Pt to perform toileting/clothes management  with only min assist    Period  Weeks    Status  Achieved   Able to complete - simulation -  in clinic.  01/27/19:  met per pt/dtr report     OT SHORT TERM GOAL #5   Title  Pt to achieve 90* shoulder flexion LUE for mid level reaching    Time  4    Period  Weeks    Status  Not Met   01/18/19:  75* with min compensation.  02/17/19:  85* with min compensation.  03/09/19: inconsistent. 03/25/19: 85* w/ compensations       OT Long Term Goals - 04/20/19 1415      OT LONG TERM GOAL #1   Title  Independent with updated HEP - due 04/20/19    Time  8    Period  Weeks    Status  Deferred      OT LONG TERM GOAL #2   Title  Pt to be mod I with all BADLS using A/E as needed (except shower transfers to still be supervision)    Time  8    Period  Weeks    Status  Partially Met   02/17/19:  assist for compression stockings, min A for depends, supervision for shower transfer (mod I for bathing), toileting with BSC mod I     OT LONG TERM GOAL #3   Title  Pt to improve LUE function as evidenced by performing 18 blocks or greater on Box & Blocks test--revised 03/19/19    Time  4    Period  Weeks    Status  Revised   02/17/19: 13, 04/20/19: 15     OT LONG TERM GOAL #4   Title  Pt to  improve coordination as evidenced by performing 9 hole peg test completely in 2 min. 30 sec or less    Baseline  placed 5 in 2 min.    Time  4    Period  Weeks    Status  Revised   02/17/19:  5 pegs within 12mn.  completed in 451m 43sec. 04/20/19: completed in 3 min. 28 sec.     OT LONG TERM GOAL #5   Title  Grip strength Lt hand to be 10 lbs or greater    Baseline  5 lbs    Time  4    Period  Weeks    Status  Revised   02/17/19:  5lbs, 04/20/19: 5 lbs     OT LONG TERM GOAL #6   Title  Pt to perform simple snack, cold food, and microwaveable food mod I level with DME prn without LOB    Time  4    Period  Weeks    Status  On-going   02/17/19:  not performing. 03/25/19: performed in clinic making sandwich w/ close sup (partly from w/c level, mostly from standing)     OT LONG TERM GOAL #7   Title  Pt to achieve 110* or greater P/ROM Lt shoulder with pain less than or equal to 5/10    Time  8    Period  Weeks    Status  Achieved   02/17/19:  110-115* in sitting with 4-5/10 pain           Plan - 04/20/19 1417    Clinical Impression Statement  Progress note reporting from 03/09/19 - 04/20/19: Pt has 4/5 STG's, fully met 1 LTG and partially met another LTG. Pt continues to make slow steady progress in  posture and trunk control, LUE function, and functional transfers/mobility but would benefit from continued O.T. to maximize LUE function, independence w/ ADLS, and functional standing and mobility.    Occupational performance deficits (Please refer to evaluation for details):  ADL's;IADL's;Leisure    Body Structure / Function / Physical Skills  ADL;ROM;Balance;IADL;Body mechanics;Improper spinal/pelvic alignment;Mobility;Strength;Flexibility;FMC;Coordination;UE functional use;Proprioception;Decreased knowledge of use of DME    Rehab Potential  Good    Comorbidities Affecting Occupational Performance:  Presence of comorbidities impacting occupational performance    OT Frequency  2x / week    for additonal 4 weeks   OT Duration  4 weeks    OT Treatment/Interventions  Self-care/ADL training;Therapeutic exercise;Functional Mobility Training;Neuromuscular education;Aquatic Therapy;Manual Therapy;Splinting;Therapeutic activities;Paraffin;DME and/or AE instruction;Cognitive remediation/compensation;Electrical Stimulation;Visual/perceptual remediation/compensation;Moist Heat;Passive range of motion;Patient/family education    Plan  Renewal completed today, continue progress towards revised and ongoing LTG's.    Consulted and Agree with Plan of Care  Patient;Family member/caregiver    Family Member Consulted  daughter       Patient will benefit from skilled therapeutic intervention in order to improve the following deficits and impairments:   Body Structure / Function / Physical Skills: ADL, ROM, Balance, IADL, Body mechanics, Improper spinal/pelvic alignment, Mobility, Strength, Flexibility, FMC, Coordination, UE functional use, Proprioception, Decreased knowledge of use of DME       Visit Diagnosis: Hemiplegia and hemiparesis following cerebral infarction affecting left non-dominant side (HCC)  Unsteadiness on feet  Muscle weakness (generalized)  Stiffness of left shoulder, not elsewhere classified  Other lack of coordination  Chronic left shoulder pain    Problem List Patient Active Problem List   Diagnosis Date Noted  . Diverticulitis of large intestine with perforation without abscess or bleeding 04/16/2019  . Iron deficiency anemia 02/03/2019  . Pelvic fracture ([place]) 09/21/2018  . Allergic reaction caused by a drug 05/06/2018  . Bilateral leg edema 04/13/2018  . HLD (hyperlipidemia) 03/23/2018  . Mood swings 03/23/2018  . Chronic kidney disease (CKD), stage III (moderate)   . Embolic stroke ([place]) 60/67/7034  . Left hemiparesis ([place])   . Diabetes mellitus type 2 in nonobese (HCC)   . Acute ischemic stroke ([place])   . Left arm weakness   . Acute CVA  (cerebrovascular accident) ([place]) 02/24/2018  . Overweight (BMI 25.0-29.9) 02/24/2018  . CKD (chronic kidney disease), stage III 02/24/2018  . Diabetes mellitus without complication ([place]) 03/52/4818  . HTN (hypertension) 09/02/2017    Carey Bullocks, OTR/L 04/20/2019, 2:27 PM  [facility] [address] [place], [place], [address] Phone: [phone]   Fax:  [phone]  Name: BABY GIEGER MRN: [id number] Date of Birth: 05/22/1946

## 2019-04-20 NOTE — Therapy (Signed)
[facility] [address] [place], [place], [address] Phone: [phone]   Fax:  [phone]  Physical Therapy Treatment  Patient Details  Name: [name] MRN: [id number] Date of Birth: [birth year] Referring Provider (PT): Alysia Penna, MD   Encounter Date: 04/20/2019  PT End of Session - 04/20/19 1403    Visit Number  41    Number of Visits  81    Date for PT Re-Evaluation  06/16/19    Authorization Type  [employer] Medicare, 10th visit progress note    PT Start Time  0933    PT Stop Time  1012    PT Time Calculation (min)  39 min    Activity Tolerance  Patient tolerated treatment well    Behavior During Therapy  [facility] for tasks assessed/performed       Past Medical History:  Diagnosis Date  . Allergy   . Arthritis   . Cataract   . Diabetes mellitus without complication ([place])   . Hypertension   . Macular degeneration syndrome    with edema---being treated.     Past Surgical History:  Procedure Laterality Date  . callous removal  Left 2017   located on 1st digit on L foot 2/2 diabetes  . EYE SURGERY      There were no vitals filed for this visit.  Subjective Assessment - 04/20/19 0933    Subjective  States that she is getting more independent by the day. Walking is going well at home.    Patient is accompained by:  Family member   daughter [name]   Pertinent History  Patient had a R CVA 02/2018 and was walking with a SPC prior to a fall in March 2020 resulting in being hospitalized with multiple pelvic fxs and a L hip fx. During hospital stay, patient acquired pressure ulcers on bilateral soles of feet near heels.Patient is now safely able to bear weight and pressure ulcers are now a stage 1 (per daughter report and photos).    Limitations  Standing;Walking;House hold activities    Patient Stated Goals  "Be back to normal, get back to walking, and getting strength back"    Currently in Pain?  No/denies                        [facility] Adult PT Treatment/Exercise - 04/20/19 0001      Transfers   Transfers  Sit to Stand;Stand to Sit    Sit to Stand  5: Supervision;Without upper extremity assist    Sit to Stand Details  Verbal cues for sequencing;Verbal cues for technique    Sit to Stand Details (indicate cue type and reason)  From higher mat table surfaces x4 reps without UE support, pt with improved eccentric control.       Ambulation/Gait   Ambulation/Gait  Yes    Ambulation/Gait Assistance  4: Min guard    Ambulation/Gait Assistance Details  Pt received for PT today seated at mat table after OT session - initial bout of gait was ambulating one lap around therapy gym - pt requiring 2 brief standing rest breaks when performing. Cues for increased step length on RLE and upright posture. During 2nd bout of gait, pt demonstrating increased forward flexed posture when fatigued and impaired LLE foot clearance - only able to ambulate 55' during 2nd bout before needing sitting rest break in w/c. Pt's daughter [name] with w/c follow due to pt reporting increased fatigue.  Ambulation Distance (Feet)  --   1 x 115, 1 x 55   Assistive device  Rolling walker    Gait Pattern  Step-through pattern;Trunk flexed;Decreased weight shift to left;Decreased hip/knee flexion - left;Poor foot clearance - left    Ambulation Surface  Level;Indoor    Pre-Gait Activities  Standing at edge of mat with no UE support 4 x 1 minute reps static standing balance with mirror for visual cues for weight shift to L and frequent verbal cues for upright posture - min guard and min A for ocassional weight shifting to LLE.               PT Short Term Goals - 04/17/19 1231      PT SHORT TERM GOAL #1   Title  Pt will demonstrate ability to perform ongoing HEP (stretching and standing exercises) with husband's supervision    Status  Revised    Target Date  05/17/19      PT SHORT TERM GOAL #2   Title  Patient  will increase BERG score to at least a 40/56 in order to help decrease risk of future falls.    Baseline  37/56    Status  Revised    Target Date  05/17/19      PT SHORT TERM GOAL #3   Title  Pt will consistently ambulate with RW to/from treatment area with supervision    Time  4    Period  Weeks    Status  New    Target Date  05/17/19      PT SHORT TERM GOAL #4   Title  Pt will improve gait velocity with LRAD to >/= 1.0 ft/sec    Baseline  .98 ft/sec with RW    Time  4    Period  Weeks    Status  Revised    Target Date  05/17/19      PT SHORT TERM GOAL #5   Title  Patient will negotiate 4 stairs with 2 rails, step to sequence with supervision    Time  4    Period  Weeks    Status  New    Target Date  05/17/19        PT Long Term Goals - 04/16/19 0807      PT LONG TERM GOAL #1   Title  Patient will report performing HEP atleast 5 days a week with husband's supervision    Baseline  04/13/19: met with current program    Status  Revised    Target Date  06/16/19      PT LONG TERM GOAL #2   Title  Pt will ambulate 230' with LRAD over indoor surfaces with supervision and will report ambulating to/from the bathroom with RW and supervision at home    Baseline  04/16/19: max distance of 115 feet before needed a rest break with supervision to min guard assist with RW    Time  8    Period  Weeks    Status  Revised    Target Date  06/16/19      PT LONG TERM GOAL #3   Title  Patient will increase BERG score to at least 44/56 in order to decrease risk of future falls.    Baseline  --    Time  8    Period  Weeks    Status  Revised    Target Date  06/16/19      PT LONG TERM GOAL #4  Title  Pt will perform sit <> stand and stand pivots with LRAD MOD I    Baseline  04/13/19: supervision for sit<>stand and stand pivot tranfers on consistent basis, improved from occasional need of min assist    Time  8    Period  Weeks    Status  Revised    Target Date  06/16/19      PT LONG  TERM GOAL #5   Title  Patient will increase gait speed to at least 1.8t/sec with LRAD in order to decrease risk of falls and improve household ambulation.    Baseline  --    Time  8    Period  Weeks    Status  Revised    Target Date  06/16/19      PT LONG TERM GOAL #6   Title  Pt will ambulate from car <> clinic waiting area (with car pulled up to entrance) with RW and min guard leaving w/c completely in car    Baseline  --    Time  8    Period  Weeks    Status  New    Target Date  06/16/19            Plan - 04/20/19 1404    Clinical Impression Statement  Focus of today's session included gait training, standing balance tolerance, and LE strengthening. Pt able to ambulate 115' at beginning of session with RW, pt fatigued more easily during 2nd bout of gait with noted decreased LLE foot clearance and cues to help push RW with LUE. Use of mirror today for visual feedback for standing balance for cues for upright posture in standing as well as weight shifting towards LLE. Frequent seated rest breaks provided today due to fatigue. Pt continues to demonstrate impaired posture, decreased LE strength, and decreased endurance for gait - will continue to benefit from skilled PT in order to progress towards LTGs.    PT Frequency  2x / week    PT Duration  8 weeks    PT Treatment/Interventions  ADLs/Self Care Home Management;Gait training;DME Instruction;Stair training;Functional mobility training;Therapeutic activities;Patient/family education;Neuromuscular re-education;Balance training;Therapeutic exercise;Passive range of motion;Orthotic Fit/Training    PT Next Visit Plan  WALKING TO/FROM TREATMENT AREA FROM WAITING AREA -PROGRESS AWAY FROM W/C.  Step ups to L and use of LUE and LLE to PUSH to L; Progress standing tolerance, work on taking items out of microwave (work with OT to not duplicate), standing LE strengthening, posture, functional mobility.  Emphasize carryover to home environment with  small increases in mobility.    PT Home Exercise Plan  [id number]    Consulted and Agree with Plan of Care  Patient;Family member/caregiver    Family Member Consulted  daughter, [name]       Patient will benefit from skilled therapeutic intervention in order to improve the following deficits and impairments:  Abnormal gait, Decreased activity tolerance, Decreased balance, Decreased range of motion, Decreased mobility, Decreased endurance, Decreased skin integrity, Decreased strength, Difficulty walking, Impaired perceived functional ability, Postural dysfunction  Visit Diagnosis: Hemiplegia and hemiparesis following cerebral infarction affecting left non-dominant side (HCC)  Unsteadiness on feet  Muscle weakness (generalized)  Other abnormalities of gait and mobility     Problem List Patient Active Problem List   Diagnosis Date Noted  . Diverticulitis of large intestine with perforation without abscess or bleeding 04/16/2019  . Iron deficiency anemia 02/03/2019  . Pelvic fracture ([place]) 09/21/2018  . Allergic reaction caused by a drug 05/06/2018  .  Bilateral leg edema 04/13/2018  . HLD (hyperlipidemia) 03/23/2018  . Mood swings 03/23/2018  . Chronic kidney disease (CKD), stage III (moderate)   . Embolic stroke ([place]) 62/26/3335  . Left hemiparesis ([place])   . Diabetes mellitus type 2 in nonobese (HCC)   . Acute ischemic stroke ([place])   . Left arm weakness   . Acute CVA (cerebrovascular accident) ([place]) 02/24/2018  . Overweight (BMI 25.0-29.9) 02/24/2018  . CKD (chronic kidney disease), stage III 02/24/2018  . Diabetes mellitus without complication ([place]) 45/62/5638  . HTN (hypertension) 09/02/2017    Arliss Journey, PT, DPT  04/20/2019, 2:11 PM  [facility] [address] [place], [place], [address] Phone: [phone]   Fax:  [phone]  Name: [name] MRN: [id number] Date of Birth: 1945-11-26

## 2019-04-21 ENCOUNTER — Ambulatory Visit: Payer: Medicare Other | Admitting: Rehabilitative and Restorative Service Providers"

## 2019-04-23 ENCOUNTER — Ambulatory Visit: Payer: Medicare Other | Admitting: Physical Therapy

## 2019-04-23 ENCOUNTER — Other Ambulatory Visit: Payer: Self-pay

## 2019-04-23 ENCOUNTER — Encounter: Payer: Self-pay | Admitting: Physical Therapy

## 2019-04-23 DIAGNOSIS — R2681 Unsteadiness on feet: Secondary | ICD-10-CM

## 2019-04-23 DIAGNOSIS — I69354 Hemiplegia and hemiparesis following cerebral infarction affecting left non-dominant side: Secondary | ICD-10-CM | POA: Diagnosis not present

## 2019-04-23 DIAGNOSIS — R2689 Other abnormalities of gait and mobility: Secondary | ICD-10-CM | POA: Diagnosis not present

## 2019-04-23 DIAGNOSIS — M25612 Stiffness of left shoulder, not elsewhere classified: Secondary | ICD-10-CM | POA: Diagnosis not present

## 2019-04-23 DIAGNOSIS — G8929 Other chronic pain: Secondary | ICD-10-CM | POA: Diagnosis not present

## 2019-04-23 DIAGNOSIS — M6281 Muscle weakness (generalized): Secondary | ICD-10-CM | POA: Diagnosis not present

## 2019-04-23 DIAGNOSIS — M25512 Pain in left shoulder: Secondary | ICD-10-CM | POA: Diagnosis not present

## 2019-04-23 DIAGNOSIS — R278 Other lack of coordination: Secondary | ICD-10-CM | POA: Diagnosis not present

## 2019-04-25 NOTE — Therapy (Signed)
[facility] [address] [place], [place], [address] Phone: [phone]   Fax:  [phone]  Physical Therapy Treatment  Patient Details  Name: [name] MRN: [id number] Date of Birth: 1946-03-07 Referring Provider (PT): Alysia Penna, MD   Encounter Date: 04/23/2019   04/23/19 0727  PT Visits / Re-Eval  Visit Number 42  Number of Visits 56  Date for PT Re-Evaluation 06/16/19  Authorization  Authorization Type [employer] Medicare, 10th visit progress note  PT Time Calculation  PT Start Time 0722 (pt running late for appt today)  PT Stop Time 0800  PT Time Calculation (min) 38 min  PT - End of Session  Equipment Utilized During Treatment Gait belt  Activity Tolerance Patient tolerated treatment well  Behavior During Therapy [facility] for tasks assessed/performed     Past Medical History:  Diagnosis Date  . Allergy   . Arthritis   . Cataract   . Diabetes mellitus without complication ([place])   . Hypertension   . Macular degeneration syndrome    with edema---being treated.     Past Surgical History:  Procedure Laterality Date  . callous removal  Left 2017   located on 1st digit on L foot 2/2 diabetes  . EYE SURGERY      There were no vitals filed for this visit.      04/23/19 0727  Symptoms/Limitations  Subjective No new complaints. No falls or pain to report.  Patient is accompained by: Family member [name])  Pertinent History Patient had a R CVA 02/2018 and was walking with a SPC prior to a fall in March 2020 resulting in being hospitalized with multiple pelvic fxs and a L hip fx. During hospital stay, patient acquired pressure ulcers on bilateral soles of feet near heels.Patient is now safely able to bear weight and pressure ulcers are now a stage 1 (per daughter report and photos).  Limitations Standing;Walking;House hold activities  Patient Stated Goals "Be back to normal, get back to walking, and  getting strength back"  Pain Assessment  Pain Score 0     04/23/19 0729  Transfers  Transfers Sit to Stand;Stand to Sit  Sit to Stand 5: Supervision;Without upper extremity assist  Stand to Sit 5: Supervision;With upper extremity assist;To bed;To chair/3-in-1  Ambulation/Gait  Ambulation/Gait Yes  Ambulation/Gait Assistance 4: Min guard  Ambulation/Gait Assistance Details cues on posture and to stay close to walker with gait.   Ambulation Distance (Feet) 100 Feet (x1)  Assistive device Rolling walker  Gait Pattern Step-through pattern;Trunk flexed;Decreased weight shift to left;Decreased hip/knee flexion - left;Poor foot clearance - left  Ambulation Surface Level;Indoor  Knee/Hip Exercises: Standing  Lateral Step Up Left;1 set;10 reps;Hand Hold: 1  Lateral Step Up Limitations at counter top with UE support, cues on form and technique  Forward Step Up Left;1 set;10 reps;Hand Hold: 1;Step Height: 4"  Forward Step Up Limitations at counter top with UE support with cues on form and technique         04/23/19 0739  Balance Exercises: Standing  Standing Eyes Opened Wide (BOA);Head turns;Solid surface;Other reps (comment);Limitations  Standing Eyes Closed Wide (BOA);Solid surface;2 reps;20 secs;Limitations  SLS with Vectors Solid surface;Upper extremity assist 1;Other reps (comment);Limitations  Other Standing Exercises standing to play zoom ball with cues/facilitaiton for upright posture. min to mod assist for balance/upright posture with daughter playing with patient. cues for full movements in pt's available range and to look forward to assist with posture. total play time 3-4  minutes in standing.   Balance Exercises: Standing  SLS with Vectors Limitations standing on floor with 2 foam bubbles: alternating fwd , then cross foot taps with cues on posture, stance position and weight shfiting. min guard to min assist for balance with single to no UE support.        PT Short Term  Goals - 04/17/19 1231      PT SHORT TERM GOAL #1   Title  Pt will demonstrate ability to perform ongoing HEP (stretching and standing exercises) with husband's supervision    Status  Revised    Target Date  05/17/19      PT SHORT TERM GOAL #2   Title  Patient will increase BERG score to at least a 40/56 in order to help decrease risk of future falls.    Baseline  37/56    Status  Revised    Target Date  05/17/19      PT SHORT TERM GOAL #3   Title  Pt will consistently ambulate with RW to/from treatment area with supervision    Time  4    Period  Weeks    Status  New    Target Date  05/17/19      PT SHORT TERM GOAL #4   Title  Pt will improve gait velocity with LRAD to >/= 1.0 ft/sec    Baseline  .18 ft/sec with RW    Time  4    Period  Weeks    Status  Revised    Target Date  05/17/19      PT SHORT TERM GOAL #5   Title  Patient will negotiate 4 stairs with 2 rails, step to sequence with supervision    Time  4    Period  Weeks    Status  New    Target Date  05/17/19        PT Long Term Goals - 04/16/19 0807      PT LONG TERM GOAL #1   Title  Patient will report performing HEP atleast 5 days a week with husband's supervision    Baseline  04/13/19: met with current program    Status  Revised    Target Date  06/16/19      PT LONG TERM GOAL #2   Title  Pt will ambulate 230' with LRAD over indoor surfaces with supervision and will report ambulating to/from the bathroom with RW and supervision at home    Baseline  04/16/19: max distance of 115 feet before needed a rest break with supervision to min guard assist with RW    Time  8    Period  Weeks    Status  Revised    Target Date  06/16/19      PT LONG TERM GOAL #3   Title  Patient will increase BERG score to at least 44/56 in order to decrease risk of future falls.    Baseline  --    Time  8    Period  Weeks    Status  Revised    Target Date  06/16/19      PT LONG TERM GOAL #4   Title  Pt will perform sit <>  stand and stand pivots with LRAD MOD I    Baseline  04/13/19: supervision for sit<>stand and stand pivot tranfers on consistent basis, improved from occasional need of min assist    Time  8    Period  Weeks    Status  Revised    Target Date  06/16/19      PT LONG TERM GOAL #5   Title  Patient will increase gait speed to at least 1.8t/sec with LRAD in order to decrease risk of falls and improve household ambulation.    Baseline  --    Time  8    Period  Weeks    Status  Revised    Target Date  06/16/19      PT LONG TERM GOAL #6   Title  Pt will ambulate from car <> clinic waiting area (with car pulled up to entrance) with RW and min guard leaving w/c completely in car    Baseline  --    Time  8    Period  Weeks    Status  New    Target Date  06/16/19         04/23/19 7544  Plan  Clinical Impression Statement Today's skilled session continued to focus on gait with RW, balance and activity tolerance with an emphasis on tall posture. Pt with good participation in today's session. The pt is progressing toward goals and should benefit from continued PT to progress toward unmet goals.  Pt will benefit from skilled therapeutic intervention in order to improve on the following deficits Abnormal gait;Decreased activity tolerance;Decreased balance;Decreased range of motion;Decreased mobility;Decreased endurance;Decreased skin integrity;Decreased strength;Difficulty walking;Impaired perceived functional ability;Postural dysfunction  PT Frequency 2x / week  PT Duration 8 weeks  PT Treatment/Interventions ADLs/Self Care Home Management;Gait training;DME Instruction;Stair training;Functional mobility training;Therapeutic activities;Patient/family education;Neuromuscular re-education;Balance training;Therapeutic exercise;Passive range of motion;Orthotic Fit/Training  PT Next Visit Plan WALKING TO/FROM TREATMENT AREA FROM WAITING AREA -PROGRESS AWAY FROM W/C.  Step ups to L and use of LUE and LLE to  PUSH to L; Progress standing tolerance, work on taking items out of microwave (work with OT to not duplicate), standing LE strengthening, posture, functional mobility.  Emphasize carryover to home environment with small increases in mobility.  PT Home Exercise Plan [id number]  Consulted and Agree with Plan of Care Patient;Family member/caregiver  Family Member Consulted daughter, [name]          Patient will benefit from skilled therapeutic intervention in order to improve the following deficits and impairments:  Abnormal gait, Decreased activity tolerance, Decreased balance, Decreased range of motion, Decreased mobility, Decreased endurance, Decreased skin integrity, Decreased strength, Difficulty walking, Impaired perceived functional ability, Postural dysfunction  Visit Diagnosis: Hemiplegia and hemiparesis following cerebral infarction affecting left non-dominant side (HCC)  Unsteadiness on feet  Muscle weakness (generalized)  Other abnormalities of gait and mobility     Problem List Patient Active Problem List   Diagnosis Date Noted  . Diverticulitis of large intestine with perforation without abscess or bleeding 04/16/2019  . Iron deficiency anemia 02/03/2019  . Pelvic fracture ([place]) 09/21/2018  . Allergic reaction caused by a drug 05/06/2018  . Bilateral leg edema 04/13/2018  . HLD (hyperlipidemia) 03/23/2018  . Mood swings 03/23/2018  . Chronic kidney disease (CKD), stage III (moderate)   . Embolic stroke ([place]) 92/07/69  . Left hemiparesis ([place])   . Diabetes mellitus type 2 in nonobese (HCC)   . Acute ischemic stroke ([place])   . Left arm weakness   . Acute CVA (cerebrovascular accident) ([place]) 02/24/2018  . Overweight (BMI 25.0-29.9) 02/24/2018  . CKD (chronic kidney disease), stage III 02/24/2018  . Diabetes mellitus without complication ([place]) 21/97/5883  . HTN (hypertension) 09/02/2017    Willow Ora, PTA, [facility] [phone] [facility]  [address]  [place], [place] [address] [phone] 04/25/19, 9:53 PM   Name: [name] MRN: [id number] Date of Birth: 1945/09/23

## 2019-04-27 ENCOUNTER — Ambulatory Visit: Payer: Medicare Other | Admitting: Physical Therapy

## 2019-04-27 ENCOUNTER — Encounter: Payer: Self-pay | Admitting: Physical Therapy

## 2019-04-27 ENCOUNTER — Other Ambulatory Visit: Payer: Self-pay

## 2019-04-27 DIAGNOSIS — R2689 Other abnormalities of gait and mobility: Secondary | ICD-10-CM | POA: Diagnosis not present

## 2019-04-27 DIAGNOSIS — I69354 Hemiplegia and hemiparesis following cerebral infarction affecting left non-dominant side: Secondary | ICD-10-CM | POA: Diagnosis not present

## 2019-04-27 DIAGNOSIS — M25512 Pain in left shoulder: Secondary | ICD-10-CM | POA: Diagnosis not present

## 2019-04-27 DIAGNOSIS — M6281 Muscle weakness (generalized): Secondary | ICD-10-CM | POA: Diagnosis not present

## 2019-04-27 DIAGNOSIS — R278 Other lack of coordination: Secondary | ICD-10-CM | POA: Diagnosis not present

## 2019-04-27 DIAGNOSIS — G8929 Other chronic pain: Secondary | ICD-10-CM | POA: Diagnosis not present

## 2019-04-27 DIAGNOSIS — R2681 Unsteadiness on feet: Secondary | ICD-10-CM | POA: Diagnosis not present

## 2019-04-27 DIAGNOSIS — M25612 Stiffness of left shoulder, not elsewhere classified: Secondary | ICD-10-CM | POA: Diagnosis not present

## 2019-04-27 NOTE — Therapy (Signed)
[facility] [address] [place], [place], [address] Phone: [phone]   Fax:  [phone]  Physical Therapy Treatment  Patient Details  Name: [name] MRN: [id number] Date of Birth: [birth year] Referring Provider (PT): Alysia Penna, MD   Encounter Date: 04/27/2019  PT End of Session - 04/27/19 1721    Visit Number  43    Number of Visits  58    Date for PT Re-Evaluation  06/16/19    Authorization Type  [employer] Medicare, 10th visit progress note    PT Start Time  0848    PT Stop Time  0930    PT Time Calculation (min)  42 min    Activity Tolerance  Patient tolerated treatment well    Behavior During Therapy  [facility] for tasks assessed/performed       Past Medical History:  Diagnosis Date  . Allergy   . Arthritis   . Cataract   . Diabetes mellitus without complication ([place])   . Hypertension   . Macular degeneration syndrome    with edema---being treated.     Past Surgical History:  Procedure Laterality Date  . callous removal  Left 2017   located on 1st digit on L foot 2/2 diabetes  . EYE SURGERY      There were no vitals filed for this visit.  Subjective Assessment - 04/27/19 0858    Subjective  Went on a long car ride yesterday looking at log cab[place].  Everything still going well at home - still walking, doing standing exercises and doing activities in the kitchen.    Patient is accompained by:  Family member   [name]   Pertinent History  Patient had a R CVA 02/2018 and was walking with a SPC prior to a fall in March 2020 resulting in being hospitalized with multiple pelvic fxs and a L hip fx. During hospital stay, patient acquired pressure ulcers on bilateral soles of feet near heels.Patient is now safely able to bear weight and pressure ulcers are now a stage 1 (per daughter report and photos).    Limitations  Standing;Walking;House hold activities    Patient Stated Goals  "Be back to normal, get  back to walking, and getting strength back"    Currently in Pain?  No/denies                       [facility] Adult PT Treatment/Exercise - 04/27/19 0904      Transfers   Transfers  Sit to Stand;Stand to Sit    Sit to Stand  5: Supervision    Stand to Sit  5: Supervision    Stand to Sit Details  verbal cues for full pivot and to reach back prior to sitting       Ambulation/Gait   Ambulation/Gait  Yes    Ambulation/Gait Assistance  4: Min guard    Ambulation/Gait Assistance Details  from waiting area with RW and min A to keep RW centered and for head position to improve weight shifting.  Pt continues to demonstrate intermittent L toe catching on floor.  Discussed leather toe cap on L shoe and donned trial shoe with leather toe cap.  Performed gait trial with leather toe cap with pt demonstrating improved L foot clearance, step length and continuous stepping pattern and forward momentum    Ambulation Distance (Feet)  200 Feet    Assistive device  Rolling walker    Gait Pattern  Step-through  pattern;Trunk flexed;Decreased weight shift to left;Poor foot clearance - left    Ambulation Surface  Level;Indoor      Neuro Re-ed    Neuro Re-ed Details   gait pushing RW with LUE only and therapist supporting RUE to focus on increasing extension and pushing through LUE and LLE; therapist assisting with and cues for pelvic shift and weight shift to R for full step length with LLE.  Pt noted to be flexing at trunk and pushing down through RUE; continued to provide cues for more upright trunk.  Returned RUE to [employer] but pt returned to pushing strongly through RUE and pushing RW to L so returned to R HHA.  Pt fatigued quickly with this gait activity and required sitting rest break at end; unable to walk back out to waiting area.      Lumbar Exercises: Stretches   Other Lumbar Stretch Exercise  Seated forward flexion with sidebending to L reaching L hand down outside of LLE x 5 reps x 10 second hold  for low back and QL stretch to counteract retropulsion of trunk             PT Education - 04/27/19 1530    Education Details  purpose of leather toe cap; information for [employer] and how to obtain a leather toe cap    Person(s) Educated  Patient;Child(ren)    Methods  Explanation;Demonstration    Comprehension  Verbalized understanding;Returned demonstration       PT Short Term Goals - 04/17/19 1231      PT SHORT TERM GOAL #1   Title  Pt will demonstrate ability to perform ongoing HEP (stretching and standing exercises) with husband's supervision    Status  Revised    Target Date  05/17/19      PT SHORT TERM GOAL #2   Title  Patient will increase BERG score to at least a 40/56 in order to help decrease risk of future falls.    Baseline  37/56    Status  Revised    Target Date  05/17/19      PT SHORT TERM GOAL #3   Title  Pt will consistently ambulate with RW to/from treatment area with supervision    Time  4    Period  Weeks    Status  New    Target Date  05/17/19      PT SHORT TERM GOAL #4   Title  Pt will improve gait velocity with LRAD to >/= 1.0 ft/sec    Baseline  .65 ft/sec with RW    Time  4    Period  Weeks    Status  Revised    Target Date  05/17/19      PT SHORT TERM GOAL #5   Title  Patient will negotiate 4 stairs with 2 rails, step to sequence with supervision    Time  4    Period  Weeks    Status  New    Target Date  05/17/19        PT Long Term Goals - 04/16/19 0807      PT LONG TERM GOAL #1   Title  Patient will report performing HEP atleast 5 days a week with husband's supervision    Baseline  04/13/19: met with current program    Status  Revised    Target Date  06/16/19      PT LONG TERM GOAL #2   Title  Pt will ambulate 230' with LRAD over  indoor surfaces with supervision and will report ambulating to/from the bathroom with RW and supervision at home    Baseline  04/16/19: max distance of 115 feet before needed a rest break with  supervision to min guard assist with RW    Time  8    Period  Weeks    Status  Revised    Target Date  06/16/19      PT LONG TERM GOAL #3   Title  Patient will increase BERG score to at least 44/56 in order to decrease risk of future falls.    Baseline  --    Time  8    Period  Weeks    Status  Revised    Target Date  06/16/19      PT LONG TERM GOAL #4   Title  Pt will perform sit <> stand and stand pivots with LRAD MOD I    Baseline  04/13/19: supervision for sit<>stand and stand pivot tranfers on consistent basis, improved from occasional need of min assist    Time  8    Period  Weeks    Status  Revised    Target Date  06/16/19      PT LONG TERM GOAL #5   Title  Patient will increase gait speed to at least 1.8t/sec with LRAD in order to decrease risk of falls and improve household ambulation.    Baseline  --    Time  8    Period  Weeks    Status  Revised    Target Date  06/16/19      PT LONG TERM GOAL #6   Title  Pt will ambulate from car <> clinic waiting area (with car pulled up to entrance) with RW and min guard leaving w/c completely in car    Baseline  --    Time  8    Period  Weeks    Status  New    Target Date  06/16/19            Plan - 04/27/19 1709    Clinical Impression Statement  Pt continues to make progress with endurance with gait and is increasing amount of ambulation at home.  With ongoing gait training and focus on increasing gait at home with improved safety PT educated pt and daughter on adding leather toe cap to shoe to decrease friction and risk of toe dragging and to decrease risk for falling.  Pt did feel a significant difference in ability to advance LLE and plans to go today to get leather toe cap. Continued gait training focusing on weight shifting and activation on L side; pt fatigued quickly but overall tolerated increased ambulation distances and duration today.  Will continue to progress towards LTG.    PT Frequency  2x / week    PT  Duration  8 weeks    PT Treatment/Interventions  ADLs/Self Care Home Management;Gait training;DME Instruction;Stair training;Functional mobility training;Therapeutic activities;Patient/family education;Neuromuscular re-education;Balance training;Therapeutic exercise;Passive range of motion;Orthotic Fit/Training    PT Next Visit Plan  WALKING TO/FROM TREATMENT AREA FROM WAITING AREA -PROGRESS AWAY FROM W/C.  When patient has leather toe cap on L - work on walking outside with walker to/from car.  Walking with L hand on rolling table focus on PUSHING through LUE and weight shifting to R during gait.    PT Home Exercise Plan  [id number]    Consulted and Agree with Plan of Care  Patient;Family member/caregiver    Family Member  Consulted  daughter, [name]       Patient will benefit from skilled therapeutic intervention in order to improve the following deficits and impairments:  Abnormal gait, Decreased activity tolerance, Decreased balance, Decreased range of motion, Decreased mobility, Decreased endurance, Decreased skin integrity, Decreased strength, Difficulty walking, Impaired perceived functional ability, Postural dysfunction  Visit Diagnosis: Unsteadiness on feet  Other abnormalities of gait and mobility  Muscle weakness (generalized)  Hemiplegia and hemiparesis following cerebral infarction affecting left non-dominant side [facility])     Problem List Patient Active Problem List   Diagnosis Date Noted  . Diverticulitis of large intestine with perforation without abscess or bleeding 04/16/2019  . Iron deficiency anemia 02/03/2019  . Pelvic fracture ([place]) 09/21/2018  . Allergic reaction caused by a drug 05/06/2018  . Bilateral leg edema 04/13/2018  . HLD (hyperlipidemia) 03/23/2018  . Mood swings 03/23/2018  . Chronic kidney disease (CKD), stage III (moderate)   . Embolic stroke ([place]) 83/37/4451  . Left hemiparesis ([place])   . Diabetes mellitus type 2 in nonobese (H[facility])   . Acute ischemic  stroke ([place])   . Left arm weakness   . Acute CVA (cerebrovascular accident) ([place]) 02/24/2018  . Overweight (BMI 25.0-29.9) 02/24/2018  . CKD (chronic kidney disease), stage III 02/24/2018  . Diabetes mellitus without complication ([place]) 46/10/7996  . HTN (hypertension) 09/02/2017    Rico Junker, PT, DPT 04/27/19    5:23 PM    [facility] [address] [place], [place], [address] Phone: [phone]   Fax:  [phone]  Name: CATRINIA RACICOT MRN: [id number] Date of Birth: 1945/09/18

## 2019-04-30 ENCOUNTER — Encounter: Payer: Self-pay | Admitting: Physical Therapy

## 2019-04-30 ENCOUNTER — Ambulatory Visit: Payer: Medicare Other | Admitting: Physical Therapy

## 2019-04-30 ENCOUNTER — Other Ambulatory Visit: Payer: Self-pay

## 2019-04-30 ENCOUNTER — Encounter: Payer: Self-pay | Admitting: Family Medicine

## 2019-04-30 DIAGNOSIS — R2689 Other abnormalities of gait and mobility: Secondary | ICD-10-CM

## 2019-04-30 DIAGNOSIS — M6281 Muscle weakness (generalized): Secondary | ICD-10-CM

## 2019-04-30 DIAGNOSIS — G8929 Other chronic pain: Secondary | ICD-10-CM | POA: Diagnosis not present

## 2019-04-30 DIAGNOSIS — M25512 Pain in left shoulder: Secondary | ICD-10-CM | POA: Diagnosis not present

## 2019-04-30 DIAGNOSIS — I69354 Hemiplegia and hemiparesis following cerebral infarction affecting left non-dominant side: Secondary | ICD-10-CM

## 2019-04-30 DIAGNOSIS — R2681 Unsteadiness on feet: Secondary | ICD-10-CM | POA: Diagnosis not present

## 2019-04-30 DIAGNOSIS — R278 Other lack of coordination: Secondary | ICD-10-CM | POA: Diagnosis not present

## 2019-04-30 DIAGNOSIS — M25612 Stiffness of left shoulder, not elsewhere classified: Secondary | ICD-10-CM | POA: Diagnosis not present

## 2019-04-30 MED ORDER — GLUCOSE BLOOD VI STRP: ORAL_STRIP | 12 refills | Status: DC

## 2019-04-30 MED ORDER — LANCET DEVICE MISC: 99 refills | Status: DC

## 2019-04-30 NOTE — Telephone Encounter (Signed)
Last fill 02/25/19  #100/12

## 2019-05-01 NOTE — Therapy (Signed)
[facility] [address] [place], [place], [address] Phone: [phone]   Fax:  [phone]  Physical Therapy Treatment  Patient Details  Name: SERAYA JOBST MRN: [id number] Date of Birth: 04-05-1946 Referring Provider (PT): Alysia Penna, MD   Encounter Date: 04/30/2019  PT End of Session - 04/30/19 1223    Visit Number  44    Number of Visits  68    Date for PT Re-Evaluation  06/16/19    Authorization Type  [employer] Medicare, 10th visit progress note    PT Start Time  0716    PT Stop Time  0757    PT Time Calculation (min)  41 min    Activity Tolerance  Patient tolerated treatment well;No increased pain;Patient limited by fatigue    Behavior During Therapy  [facility] for tasks assessed/performed       Past Medical History:  Diagnosis Date  . Allergy   . Arthritis   . Cataract   . Diabetes mellitus without complication ([place])   . Hypertension   . Macular degeneration syndrome    with edema---being treated.     Past Surgical History:  Procedure Laterality Date  . callous removal  Left 2017   located on 1st digit on L foot 2/2 diabetes  . EYE SURGERY      There were no vitals filed for this visit.  Subjective Assessment - 04/30/19 0723    Subjective  No new complaints. No falls or pain to report. Has new toe cap on left shoe.    Pertinent History  Patient had a R CVA 02/2018 and was walking with a SPC prior to a fall in March 2020 resulting in being hospitalized with multiple pelvic fxs and a L hip fx. During hospital stay, patient acquired pressure ulcers on bilateral soles of feet near heels.Patient is now safely able to bear weight and pressure ulcers are now a stage 1 (per daughter report and photos).    Limitations  Standing;Walking;House hold activities    Patient Stated Goals  "Be back to normal, get back to walking, and getting strength back"    Currently in Pain?  No/denies    Pain Score  0-No pain            [facility] Adult PT Treatment/Exercise - 04/30/19 0725      Transfers   Transfers  Sit to Stand;Stand to Sit    Sit to Stand  5: Supervision;With upper extremity assist;From chair/3-in-1    Stand to Sit  5: Supervision;With upper extremity assist;To chair/3-in-1      Ambulation/Gait   Ambulation/Gait  Yes    Ambulation/Gait Assistance  5: Supervision;4: Min guard    Ambulation/Gait Assistance Details  Pt walked from lobby to gym, around gym and attempted to walk to the car. Pt needed to sit once in the outside lobby. pt with improved walker control today, able to keep it level and even with equal push from UE's without cues/assist. improved left foot clearance/step length with new leather toe cap.     Ambulation Distance (Feet)  75 Feet   x1, 115 x1, 85 x1   Assistive device  Rolling walker    Gait Pattern  Step-through pattern;Trunk flexed;Decreased weight shift to left;Poor foot clearance - left    Ambulation Surface  Level;Indoor          Balance Exercises - 04/30/19 0734      Balance Exercises: Standing   Standing Eyes Closed  Wide (BOA);Foam/compliant  surface;30 secs;Limitations;Head turns;Other reps (comment)    Tandem Stance  Eyes open;Upper extremity support 2;Intermittent upper extremity support;3 reps;20 secs;25 secs;Limitations      Balance Exercises: Standing   Standing Eyes Closed Limitations  on 1 inch foam with feet hip width apart, chair in front for safety: EC no head movements, progressing to EC head movements left<>right, then up<>down. min guard to min assist for balance with cues on posture and weight shifting to assist with balance recovery.                          Tandem Stance Limitations  Modified tandem stand with UE support progressing to no UE support each rep with cues/facilitaiton on posture          PT Short Term Goals - 04/17/19 1231      PT SHORT TERM GOAL #1   Title  Pt will demonstrate ability to perform ongoing HEP (stretching and  standing exercises) with husband's supervision    Status  Revised    Target Date  05/17/19      PT SHORT TERM GOAL #2   Title  Patient will increase BERG score to at least a 40/56 in order to help decrease risk of future falls.    Baseline  37/56    Status  Revised    Target Date  05/17/19      PT SHORT TERM GOAL #3   Title  Pt will consistently ambulate with RW to/from treatment area with supervision    Time  4    Period  Weeks    Status  New    Target Date  05/17/19      PT SHORT TERM GOAL #4   Title  Pt will improve gait velocity with LRAD to >/= 1.0 ft/sec    Baseline  .15 ft/sec with RW    Time  4    Period  Weeks    Status  Revised    Target Date  05/17/19      PT SHORT TERM GOAL #5   Title  Patient will negotiate 4 stairs with 2 rails, step to sequence with supervision    Time  4    Period  Weeks    Status  New    Target Date  05/17/19        PT Long Term Goals - 04/16/19 0807      PT LONG TERM GOAL #1   Title  Patient will report performing HEP atleast 5 days a week with husband's supervision    Baseline  04/13/19: met with current program    Status  Revised    Target Date  06/16/19      PT LONG TERM GOAL #2   Title  Pt will ambulate 230' with LRAD over indoor surfaces with supervision and will report ambulating to/from the bathroom with RW and supervision at home    Baseline  04/16/19: max distance of 115 feet before needed a rest break with supervision to min guard assist with RW    Time  8    Period  Weeks    Status  Revised    Target Date  06/16/19      PT LONG TERM GOAL #3   Title  Patient will increase BERG score to at least 44/56 in order to decrease risk of future falls.    Baseline  --    Time  8    Period  Weeks  Status  Revised    Target Date  06/16/19      PT LONG TERM GOAL #4   Title  Pt will perform sit <> stand and stand pivots with LRAD MOD I    Baseline  04/13/19: supervision for sit<>stand and stand pivot tranfers on consistent  basis, improved from occasional need of min assist    Time  8    Period  Weeks    Status  Revised    Target Date  06/16/19      PT LONG TERM GOAL #5   Title  Patient will increase gait speed to at least 1.8t/sec with LRAD in order to decrease risk of falls and improve household ambulation.    Baseline  --    Time  8    Period  Weeks    Status  Revised    Target Date  06/16/19      PT LONG TERM GOAL #6   Title  Pt will ambulate from car <> clinic waiting area (with car pulled up to entrance) with RW and min guard leaving w/c completely in car    Baseline  --    Time  8    Period  Weeks    Status  New    Target Date  06/16/19            Plan - 04/30/19 0724    Clinical Impression Statement  Today's skilled session continued to focus on gait, activity tolerance and balance reactions. Rest breaks needed through out the session due to fatigue. Contant cues still needed for upright posture and midline position with gait/activiites. The pt is making progress toward goals and should benefit from continued PT to progress toward unmet goals.    PT Frequency  2x / week    PT Duration  8 weeks    PT Treatment/Interventions  ADLs/Self Care Home Management;Gait training;DME Instruction;Stair training;Functional mobility training;Therapeutic activities;Patient/family education;Neuromuscular re-education;Balance training;Therapeutic exercise;Passive range of motion;Orthotic Fit/Training    PT Next Visit Plan  WALKING TO/FROM TREATMENT AREA FROM WAITING AREA -PROGRESS AWAY FROM W/C.    PT Home Exercise Plan  [id number]    Consulted and Agree with Plan of Care  Patient;Family member/caregiver    Family Member Consulted  daughter, [name]       Patient will benefit from skilled therapeutic intervention in order to improve the following deficits and impairments:  Abnormal gait, Decreased activity tolerance, Decreased balance, Decreased range of motion, Decreased mobility, Decreased endurance,  Decreased skin integrity, Decreased strength, Difficulty walking, Impaired perceived functional ability, Postural dysfunction  Visit Diagnosis: Unsteadiness on feet  Other abnormalities of gait and mobility  Muscle weakness (generalized)  Hemiplegia and hemiparesis following cerebral infarction affecting left non-dominant side [facility])     Problem List Patient Active Problem List   Diagnosis Date Noted  . Diverticulitis of large intestine with perforation without abscess or bleeding 04/16/2019  . Iron deficiency anemia 02/03/2019  . Pelvic fracture ([place]) 09/21/2018  . Allergic reaction caused by a drug 05/06/2018  . Bilateral leg edema 04/13/2018  . HLD (hyperlipidemia) 03/23/2018  . Mood swings 03/23/2018  . Chronic kidney disease (CKD), stage III (moderate)   . Embolic stroke ([place]) 54/49/2010  . Left hemiparesis ([place])   . Diabetes mellitus type 2 in nonobese (H[facility])   . Acute ischemic stroke ([place])   . Left arm weakness   . Acute CVA (cerebrovascular accident) ([place]) 02/24/2018  . Overweight (BMI 25.0-29.9) 02/24/2018  . CKD (chronic kidney disease),  stage III 02/24/2018  . Diabetes mellitus without complication ([place]) 87/19/5974  . HTN (hypertension) 09/02/2017    Willow Ora, PTA, [facility] [address], [place], [place] [address] [phone] 05/01/19, 12:36 PM   Name: BEKAH IGOE MRN: [id number] Date of Birth: July 10, 1945

## 2019-05-03 ENCOUNTER — Ambulatory Visit: Payer: Medicare Other | Admitting: Physical Therapy

## 2019-05-03 ENCOUNTER — Encounter: Payer: Self-pay | Admitting: Podiatry

## 2019-05-03 ENCOUNTER — Other Ambulatory Visit: Payer: Self-pay

## 2019-05-03 DIAGNOSIS — M6281 Muscle weakness (generalized): Secondary | ICD-10-CM | POA: Diagnosis not present

## 2019-05-03 DIAGNOSIS — M25512 Pain in left shoulder: Secondary | ICD-10-CM | POA: Diagnosis not present

## 2019-05-03 DIAGNOSIS — R2681 Unsteadiness on feet: Secondary | ICD-10-CM | POA: Diagnosis not present

## 2019-05-03 DIAGNOSIS — I69354 Hemiplegia and hemiparesis following cerebral infarction affecting left non-dominant side: Secondary | ICD-10-CM | POA: Diagnosis not present

## 2019-05-03 DIAGNOSIS — R2689 Other abnormalities of gait and mobility: Secondary | ICD-10-CM

## 2019-05-03 DIAGNOSIS — G8929 Other chronic pain: Secondary | ICD-10-CM | POA: Diagnosis not present

## 2019-05-03 DIAGNOSIS — R278 Other lack of coordination: Secondary | ICD-10-CM

## 2019-05-03 DIAGNOSIS — M25612 Stiffness of left shoulder, not elsewhere classified: Secondary | ICD-10-CM | POA: Diagnosis not present

## 2019-05-04 NOTE — Therapy (Signed)
[facility] [address] [place], [place], [address] Phone: [phone]   Fax:  [phone]  Physical Therapy Treatment  Patient Details  Name: [name] MRN: [id number] Date of Birth: 1945-12-20 Referring Provider (PT): Alysia Penna, MD   Encounter Date: 05/03/2019  PT End of Session - 05/04/19 1008    Visit Number  45    Number of Visits  71    Date for PT Re-Evaluation  06/16/19    Authorization Type  [employer] Medicare, 10th visit progress note    PT Start Time  0850    PT Stop Time  0930    PT Time Calculation (min)  40 min    Activity Tolerance  Patient tolerated treatment well;No increased pain;Patient limited by fatigue    Behavior During Therapy  [facility] for tasks assessed/performed       Past Medical History:  Diagnosis Date  . Allergy   . Arthritis   . Cataract   . Diabetes mellitus without complication ([place])   . Hypertension   . Macular degeneration syndrome    with edema---being treated.     Past Surgical History:  Procedure Laterality Date  . callous removal  Left 2017   located on 1st digit on L foot 2/2 diabetes  . EYE SURGERY      There were no vitals filed for this visit.  Subjective Assessment - 05/03/19 0856    Subjective  Had a fall last Tuesday - was going from the bed to the bedside commode - was putting too much weight through the right side. Had to call her [name] to come help pick her up off the floor. Was not hurt after the fall. Wants to work on some balanc exercises today.    Pertinent History  Patient had a R CVA 02/2018 and was walking with a SPC prior to a fall in March 2020 resulting in being hospitalized with multiple pelvic fxs and a L hip fx. During hospital stay, patient acquired pressure ulcers on bilateral soles of feet near heels.Patient is now safely able to bear weight and pressure ulcers are now a stage 1 (per daughter report and photos).    Limitations   Standing;Walking;House hold activities    Patient Stated Goals  "Be back to normal, get back to walking, and getting strength back"    Currently in Pain?  No/denies                       [facility] Adult PT Treatment/Exercise - 05/04/19 1130      Transfers   Transfers  Sit to Stand;Stand to Sit    Sit to Stand  5: Supervision;4: Min assist;With upper extremity assist    Sit to Stand Details (indicate cue type and reason)  Performed multiple times throughout today's session after seated rest breaks for standing balance activities - towards end of session pt with more fatigue and needing increased time/multiple attempts to come to stand from lower mat table with BUE. Cues for forward weight shift and upright posture once in standing.    Stand to Sit  5: Supervision;With upper extremity assist;To chair/3-in-1      Ambulation/Gait   Ambulation/Gait  Yes    Ambulation/Gait Assistance  4: Min guard    Ambulation/Gait Assistance Details  From waiting area with RW, cues for posture and foot clearance with LLE. Pt with leather toe cap on L shoe for assistance for improved foot clearance. min  A for RW management and pushing through LUE.     Ambulation Distance (Feet)  115 Feet   approx. back to treatment table   Assistive device  Rolling walker    Gait Pattern  Step-through pattern;Trunk flexed;Decreased weight shift to left;Poor foot clearance - left    Ambulation Surface  Level;Indoor      Neuro Re-ed    Neuro Re-ed Details   Standing at edge of mat with chair in front of pt and to R in case of need for UE support. Massed practice feet apart and eyes closed 15-20 seconds, occasional min A for weight shifting to L for balance. Feet apart and eyes open 3 x 5 reps horizontal head turns, 3 x 5 reps vertical head turns - with tactile and verbal cues to just turn head vs. whole trunk. Static standing balance with feet hip width apart/progressing to more narrow base and eyes open with mirror in  front of pt for visual feedback for midline. Attempted standing on 1" foam at end of session, however pt too fatigued to perform sit <> stand on foam form lower surface - will attempt again at beginning of next session.  Pt fatigues easily and needed multiple seated rest breaks throughout session today.                PT Short Term Goals - 04/17/19 1231      PT SHORT TERM GOAL #1   Title  Pt will demonstrate ability to perform ongoing HEP (stretching and standing exercises) with husband's supervision    Status  Revised    Target Date  05/17/19      PT SHORT TERM GOAL #2   Title  Patient will increase BERG score to at least a 40/56 in order to help decrease risk of future falls.    Baseline  37/56    Status  Revised    Target Date  05/17/19      PT SHORT TERM GOAL #3   Title  Pt will consistently ambulate with RW to/from treatment area with supervision    Time  4    Period  Weeks    Status  New    Target Date  05/17/19      PT SHORT TERM GOAL #4   Title  Pt will improve gait velocity with LRAD to >/= 1.0 ft/sec    Baseline  .14 ft/sec with RW    Time  4    Period  Weeks    Status  Revised    Target Date  05/17/19      PT SHORT TERM GOAL #5   Title  Patient will negotiate 4 stairs with 2 rails, step to sequence with supervision    Time  4    Period  Weeks    Status  New    Target Date  05/17/19        PT Long Term Goals - 04/16/19 0807      PT LONG TERM GOAL #1   Title  Patient will report performing HEP atleast 5 days a week with husband's supervision    Baseline  04/13/19: met with current program    Status  Revised    Target Date  06/16/19      PT LONG TERM GOAL #2   Title  Pt will ambulate 230' with LRAD over indoor surfaces with supervision and will report ambulating to/from the bathroom with RW and supervision at home    Baseline  04/16/19:  max distance of 115 feet before needed a rest break with supervision to min guard assist with RW    Time  8     Period  Weeks    Status  Revised    Target Date  06/16/19      PT LONG TERM GOAL #3   Title  Patient will increase BERG score to at least 44/56 in order to decrease risk of future falls.    Baseline  --    Time  8    Period  Weeks    Status  Revised    Target Date  06/16/19      PT LONG TERM GOAL #4   Title  Pt will perform sit <> stand and stand pivots with LRAD MOD I    Baseline  04/13/19: supervision for sit<>stand and stand pivot tranfers on consistent basis, improved from occasional need of min assist    Time  8    Period  Weeks    Status  Revised    Target Date  06/16/19      PT LONG TERM GOAL #5   Title  Patient will increase gait speed to at least 1.8t/sec with LRAD in order to decrease risk of falls and improve household ambulation.    Baseline  --    Time  8    Period  Weeks    Status  Revised    Target Date  06/16/19      PT LONG TERM GOAL #6   Title  Pt will ambulate from car <> clinic waiting area (with car pulled up to entrance) with RW and min guard leaving w/c completely in car    Baseline  --    Time  8    Period  Weeks    Status  New    Target Date  06/16/19            Plan - 05/04/19 1135    Clinical Impression Statement  Today's skilled session focused on gait, upright activity tolerance, and standing balance with narrower BOS, head turns/nods, and eyes closed. Pt needing constant visual cues from mirror as well as tactile/verbal cues for upright posture and midline orientation as pt has tendency for increased weight shift to R. Pt fatigues easily during standing balance activities with no UE support and reverts back to forward flexed posture - multiple seated rest breaks provided today due to fatigue. Pt is progressing towards LTGs and will continue to benefit from skilled PT.    PT Frequency  2x / week    PT Duration  8 weeks    PT Treatment/Interventions  ADLs/Self Care Home Management;Gait training;DME Instruction;Stair training;Functional  mobility training;Therapeutic activities;Patient/family education;Neuromuscular re-education;Balance training;Therapeutic exercise;Passive range of motion;Orthotic Fit/Training    PT Next Visit Plan  WALKING TO/FROM TREATMENT AREA FROM WAITING AREA -PROGRESS AWAY FROM W/C. standing balance with eyes closed/head turns, try next time on compliant surfaces from higher mat table for standing balance and sit <> stands.    PT Home Exercise Plan  [id number]    Consulted and Agree with Plan of Care  Patient;Family member/caregiver    Family Member Consulted  daughter, [name]       Patient will benefit from skilled therapeutic intervention in order to improve the following deficits and impairments:  Abnormal gait, Decreased activity tolerance, Decreased balance, Decreased range of motion, Decreased mobility, Decreased endurance, Decreased skin integrity, Decreased strength, Difficulty walking, Impaired perceived functional ability, Postural dysfunction  Visit Diagnosis: Unsteadiness on  feet  Other abnormalities of gait and mobility  Muscle weakness (generalized)  Other lack of coordination     Problem List Patient Active Problem List   Diagnosis Date Noted  . Diverticulitis of large intestine with perforation without abscess or bleeding 04/16/2019  . Iron deficiency anemia 02/03/2019  . Pelvic fracture ([place]) 09/21/2018  . Allergic reaction caused by a drug 05/06/2018  . Bilateral leg edema 04/13/2018  . HLD (hyperlipidemia) 03/23/2018  . Mood swings 03/23/2018  . Chronic kidney disease (CKD), stage III (moderate)   . Embolic stroke ([place]) 48/88/9169  . Left hemiparesis ([place])   . Diabetes mellitus type 2 in nonobese ([facility])   . Acute ischemic stroke ([place])   . Left arm weakness   . Acute CVA (cerebrovascular accident) ([place]) 02/24/2018  . Overweight (BMI 25.0-29.9) 02/24/2018  . CKD (chronic kidney disease), stage III 02/24/2018  . Diabetes mellitus without complication ([place]) 45/09/8880  .  HTN (hypertension) 09/02/2017    Arliss Journey, PT, DPT  05/04/2019, 11:43 AM  [facility] [address] [place], [place], [address] Phone: [phone]   Fax:  [phone]  Name: RONNIE MALLETTE MRN: [id number] Date of Birth: 1946/05/24

## 2019-05-05 ENCOUNTER — Ambulatory Visit: Payer: Medicare Other | Admitting: Rehabilitative and Restorative Service Providers"

## 2019-05-05 ENCOUNTER — Encounter: Payer: Self-pay | Admitting: Family Medicine

## 2019-05-07 ENCOUNTER — Other Ambulatory Visit: Payer: Self-pay

## 2019-05-07 ENCOUNTER — Ambulatory Visit: Payer: Medicare Other | Admitting: Physical Therapy

## 2019-05-07 ENCOUNTER — Encounter: Payer: Self-pay | Admitting: Physical Therapy

## 2019-05-07 DIAGNOSIS — G8929 Other chronic pain: Secondary | ICD-10-CM | POA: Diagnosis not present

## 2019-05-07 DIAGNOSIS — R2689 Other abnormalities of gait and mobility: Secondary | ICD-10-CM | POA: Diagnosis not present

## 2019-05-07 DIAGNOSIS — R2681 Unsteadiness on feet: Secondary | ICD-10-CM

## 2019-05-07 DIAGNOSIS — M6281 Muscle weakness (generalized): Secondary | ICD-10-CM | POA: Diagnosis not present

## 2019-05-07 DIAGNOSIS — I69354 Hemiplegia and hemiparesis following cerebral infarction affecting left non-dominant side: Secondary | ICD-10-CM | POA: Diagnosis not present

## 2019-05-07 DIAGNOSIS — M25512 Pain in left shoulder: Secondary | ICD-10-CM | POA: Diagnosis not present

## 2019-05-07 DIAGNOSIS — R278 Other lack of coordination: Secondary | ICD-10-CM | POA: Diagnosis not present

## 2019-05-07 DIAGNOSIS — M25612 Stiffness of left shoulder, not elsewhere classified: Secondary | ICD-10-CM | POA: Diagnosis not present

## 2019-05-07 NOTE — Therapy (Signed)
[facility] [address] [place], [place], [address] Phone: [phone]   Fax:  [phone]  Physical Therapy Treatment  Patient Details  Name: [name] MRN: [id number] Date of Birth: 1945-09-30 Referring Provider (PT): Alysia Penna, MD   Encounter Date: 05/07/2019  PT End of Session - 05/07/19 0902    Visit Number  73    Number of Visits  51    Date for PT Re-Evaluation  06/16/19    Authorization Type  [employer] Medicare, 10th visit progress note    PT Start Time  0805    PT Stop Time  0846    PT Time Calculation (min)  41 min    Activity Tolerance  Patient tolerated treatment well;Patient limited by fatigue;No increased pain    Behavior During Therapy  W[facility] for tasks assessed/performed       Past Medical History:  Diagnosis Date  . Allergy   . Arthritis   . Cataract   . Diabetes mellitus without complication ([place])   . Hypertension   . Macular degeneration syndrome    with edema---being treated.     Past Surgical History:  Procedure Laterality Date  . callous removal  Left 2017   located on 1st digit on L foot 2/2 diabetes  . EYE SURGERY      There were no vitals filed for this visit.  Subjective Assessment - 05/07/19 0816    Subjective  No falls. States her allergies were acting up the past couple of days and hasn't done too much at home.    Pertinent History  Patient had a R CVA 02/2018 and was walking with a SPC prior to a fall in March 2020 resulting in being hospitalized with multiple pelvic fxs and a L hip fx. During hospital stay, patient acquired pressure ulcers on bilateral soles of feet near heels.Patient is now safely able to bear weight and pressure ulcers are now a stage 1 (per daughter report and photos).    Limitations  Standing;Walking;House hold activities    Patient Stated Goals  "Be back to normal, get back to walking, and getting strength back"    Currently in Pain?  No/denies                        [facility] Adult PT Treatment/Exercise - 05/07/19 0001      Transfers   Transfers  Sit to Stand;Stand to Sit    Sit to Stand  5: Supervision;4: Min guard;With upper extremity assist;Without upper extremity assist;From elevated surface    Sit to Stand Details  Verbal cues for sequencing;Verbal cues for technique    Sit to Stand Details (indicate cue type and reason)  Performed multiple reps throughout today's session from higher mat table between standing balance exercises. Elevated mat table, at beginning of session no UE support and cues for forward weight shift, towards end when pt was fatigued needed single UE support to help come to stand.    Stand to Sit  5: Supervision;With upper extremity assist;To elevated surface    Comments  stand step transfer at end of session from high mat table to w/c - min guard and pt using single UE on chair for balance, increased forward flexed posture when performing - took increased time due to fatigue.      Ambulation/Gait   Ambulation/Gait  Yes    Ambulation/Gait Assistance  4: Min guard    Ambulation/Gait Assistance Details  From waiting  area back to treatment table - cues for upright posture throughout, LLE step length and foot clearance. Pt with tendency to overpush with RUE with walker, cues to push with LUE and intermittent min A from therapist for RW navigation management. Needed brief seated rest break halfway through due to fatigue in w/c.      Ambulation Distance (Feet)  90 Feet    Assistive device  Rolling walker    Gait Pattern  Step-through pattern;Trunk flexed;Decreased weight shift to left;Poor foot clearance - left    Ambulation Surface  Level;Indoor        NMR: Standing at edge of mat table with min guard from therapist, chair anteriorly and to right for intermittent UE support, mirror in front of pt for visual feedback for midline orientation and weight shifting to L: -feet apart eyes open on blue  foam:  -with focus on upright posture and weight shifting to L 4 x 1 minute reps no UE support   -3 x 5 reps vertical head nods  -with eyes closed 5 x 10 seconds, with cues for midline orientation before closing eyes, pt with tendency to shift towards R once eyes are closed, verbal cues from therapist to shift back to PT's voice towards L  -on level floor:  -narrow BOS with eyes closed and no UE support, multiple reps x15 seconds  -modified tandem stance 3 reps B, approx. 15 seconds and need for UE support due to fatigue    Seated rest breaks between reps due to fatigue. Cues for upright posture throughout session.         PT Short Term Goals - 04/17/19 1231      PT SHORT TERM GOAL #1   Title  Pt will demonstrate ability to perform ongoing HEP (stretching and standing exercises) with husband's supervision    Status  Revised    Target Date  05/17/19      PT SHORT TERM GOAL #2   Title  Patient will increase BERG score to at least a 40/56 in order to help decrease risk of future falls.    Baseline  37/56    Status  Revised    Target Date  05/17/19      PT SHORT TERM GOAL #3   Title  Pt will consistently ambulate with RW to/from treatment area with supervision    Time  4    Period  Weeks    Status  New    Target Date  05/17/19      PT SHORT TERM GOAL #4   Title  Pt will improve gait velocity with LRAD to >/= 1.0 ft/sec    Baseline  .79 ft/sec with RW    Time  4    Period  Weeks    Status  Revised    Target Date  05/17/19      PT SHORT TERM GOAL #5   Title  Patient will negotiate 4 stairs with 2 rails, step to sequence with supervision    Time  4    Period  Weeks    Status  New    Target Date  05/17/19        PT Long Term Goals - 04/16/19 0807      PT LONG TERM GOAL #1   Title  Patient will report performing HEP atleast 5 days a week with husband's supervision    Baseline  04/13/19: met with current program    Status  Revised    Target Date  06/16/19      PT  LONG TERM GOAL #2   Title  Pt will ambulate 230' with LRAD over indoor surfaces with supervision and will report ambulating to/from the bathroom with RW and supervision at home    Baseline  04/16/19: max distance of 115 feet before needed a rest break with supervision to min guard assist with RW    Time  8    Period  Weeks    Status  Revised    Target Date  06/16/19      PT LONG TERM GOAL #3   Title  Patient will increase BERG score to at least 44/56 in order to decrease risk of future falls.    Baseline  --    Time  8    Period  Weeks    Status  Revised    Target Date  06/16/19      PT LONG TERM GOAL #4   Title  Pt will perform sit <> stand and stand pivots with LRAD MOD I    Baseline  04/13/19: supervision for sit<>stand and stand pivot tranfers on consistent basis, improved from occasional need of min assist    Time  8    Period  Weeks    Status  Revised    Target Date  06/16/19      PT LONG TERM GOAL #5   Title  Patient will increase gait speed to at least 1.8t/sec with LRAD in order to decrease risk of falls and improve household ambulation.    Baseline  --    Time  8    Period  Weeks    Status  Revised    Target Date  06/16/19      PT LONG TERM GOAL #6   Title  Pt will ambulate from car <> clinic waiting area (with car pulled up to entrance) with RW and min guard leaving w/c completely in car    Baseline  --    Time  8    Period  Weeks    Status  New    Target Date  06/16/19            Plan - 05/07/19 0174    Clinical Impression Statement  Focus of today's skilled session included gait training and NMR for balance reactions on compliant and unlevel surfaces. Ambulated back into clinic from waiting room with RW - cues for upright posture and pushing RW with LUE, as RUE has tendency to take over and cause RW to turn towards left vs. straight ahead. Pt responded well to mirror for visual feedback and verbal cues for midline orientation and weight shifitng to L.   Seated rest breaks taken throughout session today due to fatigue. Will continue to progress towards LTGs.    PT Frequency  2x / week    PT Duration  8 weeks    PT Treatment/Interventions  ADLs/Self Care Home Management;Gait training;DME Instruction;Stair training;Functional mobility training;Therapeutic activities;Patient/family education;Neuromuscular re-education;Balance training;Therapeutic exercise;Passive range of motion;Orthotic Fit/Training    PT Next Visit Plan  WALKING TO/FROM TREATMENT AREA FROM WAITING AREA -PROGRESS AWAY FROM W/C. standing balance with eyes closed/head turns, in // bars weight shifting on rocker board, step ups to L lateral and forward for weight shifting. .    PT Home Exercise Plan  [id number]    Consulted and Agree with Plan of Care  Patient;Family member/caregiver    Family Member Consulted  daughter, [name]       Patient will  benefit from skilled therapeutic intervention in order to improve the following deficits and impairments:  Abnormal gait, Decreased activity tolerance, Decreased balance, Decreased range of motion, Decreased mobility, Decreased endurance, Decreased skin integrity, Decreased strength, Difficulty walking, Impaired perceived functional ability, Postural dysfunction  Visit Diagnosis: Unsteadiness on feet  Other abnormalities of gait and mobility  Muscle weakness (generalized)  Other lack of coordination     Problem List Patient Active Problem List   Diagnosis Date Noted  . Diverticulitis of large intestine with perforation without abscess or bleeding 04/16/2019  . Iron deficiency anemia 02/03/2019  . Pelvic fracture ([place]) 09/21/2018  . Allergic reaction caused by a drug 05/06/2018  . Bilateral leg edema 04/13/2018  . HLD (hyperlipidemia) 03/23/2018  . Mood swings 03/23/2018  . Chronic kidney disease (CKD), stage III (moderate)   . Embolic stroke ([place]) 19/16/6060  . Left hemiparesis ([place])   . Diabetes mellitus type 2 in nonobese  ([facility])   . Acute ischemic stroke ([place])   . Left arm weakness   . Acute CVA (cerebrovascular accident) ([place]) 02/24/2018  . Overweight (BMI 25.0-29.9) 02/24/2018  . CKD (chronic kidney disease), stage III 02/24/2018  . Diabetes mellitus without complication ([place]) 04/59/9774  . HTN (hypertension) 09/02/2017    Arliss Journey, PT, DPT  05/07/2019, 9:24 AM  [facility] [address] [place], [place], [address] Phone: [phone]   Fax:  [phone]  Name: [name] MRN: [id number] Date of Birth: 11-Apr-1946

## 2019-05-10 ENCOUNTER — Encounter: Payer: Self-pay | Admitting: Physical Therapy

## 2019-05-10 ENCOUNTER — Other Ambulatory Visit: Payer: Self-pay

## 2019-05-10 ENCOUNTER — Ambulatory Visit: Payer: Medicare Other | Attending: Physical Medicine & Rehabilitation | Admitting: Occupational Therapy

## 2019-05-10 ENCOUNTER — Ambulatory Visit: Payer: Medicare Other | Admitting: Physical Therapy

## 2019-05-10 DIAGNOSIS — M6281 Muscle weakness (generalized): Secondary | ICD-10-CM

## 2019-05-10 DIAGNOSIS — R278 Other lack of coordination: Secondary | ICD-10-CM | POA: Diagnosis not present

## 2019-05-10 DIAGNOSIS — R2689 Other abnormalities of gait and mobility: Secondary | ICD-10-CM | POA: Diagnosis not present

## 2019-05-10 DIAGNOSIS — G8929 Other chronic pain: Secondary | ICD-10-CM | POA: Insufficient documentation

## 2019-05-10 DIAGNOSIS — M25512 Pain in left shoulder: Secondary | ICD-10-CM | POA: Diagnosis not present

## 2019-05-10 DIAGNOSIS — R2681 Unsteadiness on feet: Secondary | ICD-10-CM | POA: Diagnosis not present

## 2019-05-10 DIAGNOSIS — I69354 Hemiplegia and hemiparesis following cerebral infarction affecting left non-dominant side: Secondary | ICD-10-CM

## 2019-05-10 DIAGNOSIS — M25612 Stiffness of left shoulder, not elsewhere classified: Secondary | ICD-10-CM | POA: Insufficient documentation

## 2019-05-10 NOTE — Therapy (Signed)
[facility] [address] [place], [place], [address] Phone: [phone]   Fax:  [phone]  Occupational Therapy Treatment  Patient Details  Name: [name] MRN: [id number] Date of Birth: 18-Oct-1945 Referring Provider (OT): Dr. Letta Pate   Encounter Date: 05/10/2019  OT End of Session - 05/10/19 0851    Visit Number  31    Number of Visits  38    Date for OT Re-Evaluation  06/07/19    Authorization Type  U[employer] MCR    Authorization Time Period  cert. period 02/19/19-05/20/19    Authorization - Visit Number  29    Authorization - Number of Visits  40    OT Start Time  0845    OT Stop Time  0930    OT Time Calculation (min)  45 min    Activity Tolerance  Patient tolerated treatment well    Behavior During Therapy  WFL for tasks assessed/performed       Past Medical History:  Diagnosis Date  . Allergy   . Arthritis   . Cataract   . Diabetes mellitus without complication ([place])   . Hypertension   . Macular degeneration syndrome    with edema---being treated.     Past Surgical History:  Procedure Laterality Date  . callous removal  Left 2017   located on 1st digit on L foot 2/2 diabetes  . EYE SURGERY      There were no vitals filed for this visit.  Subjective Assessment - 05/10/19 0806    Subjective   Daughter reports pt is having a bad day    Patient is accompanied by:  Family member    Pertinent History  CVA 02/27/18, pelvic fx and Lt hip  fx 09/2018. PMH: HTN, OA    Limitations  Fall risk, wounds on feet    Currently in Pain?  No/denies       Pt practiced donning/doffing jacket with min assist. Pt donning and doffing velcro shoes w/ extra time and difficulty. Therapist assisted to get velcro lined up correctly on one shoe.  Simulated donning/doffing pants using theraband w/ extra time and mod difficulty. Pt required cueing for better foot placement prior to standing and to get balance prior to  pulling pants up over hips.  Flipping large cards over LUE with cues needed to slide card towards her and getting thumb sufficiently underneath card for better grip. Attempted UBE for LUE ROM, however painful therefore d/c and performed table slides to stretch LUE.                       OT Short Term Goals - 04/20/19 1414      OT SHORT TERM GOAL #1   Title  Independent with initial HEP for shoulder ROM, and functional reaching LUE    Time  4    Period  Weeks    Status  Achieved      OT SHORT TERM GOAL #2   Title  Pt to perform UB dressing with set up only and LE dressing with mod assist or less using A/E prn    Time  4    Period  Weeks    Status  Achieved      OT SHORT TERM GOAL #3   Title  Pt to increase LUE functional use as evidenced by performing 15 blocks on Box & Blocks test.--STGs due 03/21/19   (modified 03/19/19)    Baseline  11  blocks    Time  4    Period  Weeks    Status  Achieved   01/27/19:  11 blocks, 02/17/19:  13, 03/19/19 10 blocks, 04/20/19 15 blocks     OT SHORT TERM GOAL #4   Title  Pt to perform toileting/clothes management with only min assist    Period  Weeks    Status  Achieved   Able to complete - simulation -  in clinic.  01/27/19:  met per pt/dtr report     OT SHORT TERM GOAL #5   Title  Pt to achieve 90* shoulder flexion LUE for mid level reaching    Time  4    Period  Weeks    Status  Not Met   01/18/19:  75* with min compensation.  02/17/19:  85* with min compensation.  03/09/19: inconsistent. 03/25/19: 85* w/ compensations       OT Long Term Goals - 04/20/19 1415      OT LONG TERM GOAL #1   Title  Independent with updated HEP - due 04/20/19    Time  8    Period  Weeks    Status  Deferred      OT LONG TERM GOAL #2   Title  Pt to be mod I with all BADLS using A/E as needed (except shower transfers to still be supervision)    Time  8    Period  Weeks    Status  Partially Met   02/17/19:  assist for compression stockings, min A  for depends, supervision for shower transfer (mod I for bathing), toileting with BSC mod I     OT LONG TERM GOAL #3   Title  Pt to improve LUE function as evidenced by performing 18 blocks or greater on Box & Blocks test--revised 03/19/19    Time  4    Period  Weeks    Status  Revised   02/17/19: 13, 04/20/19: 15     OT LONG TERM GOAL #4   Title  Pt to improve coordination as evidenced by performing 9 hole peg test completely in 2 min. 30 sec or less    Baseline  placed 5 in 2 min.    Time  4    Period  Weeks    Status  Revised   02/17/19:  5 pegs within 75mn.  completed in 442m 43sec. 04/20/19: completed in 3 min. 28 sec.     OT LONG TERM GOAL #5   Title  Grip strength Lt hand to be 10 lbs or greater    Baseline  5 lbs    Time  4    Period  Weeks    Status  Revised   02/17/19:  5lbs, 04/20/19: 5 lbs     OT LONG TERM GOAL #6   Title  Pt to perform simple snack, cold food, and microwaveable food mod I level with DME prn without LOB    Time  4    Period  Weeks    Status  On-going   02/17/19:  not performing. 03/25/19: performed in clinic making sandwich w/ close sup (partly from w/c level, mostly from standing)     OT LONG TERM GOAL #7   Title  Pt to achieve 110* or greater P/ROM Lt shoulder with pain less than or equal to 5/10    Time  8    Period  Weeks    Status  Achieved   02/17/19:  110-115* in sitting  with 4-5/10 pain           Plan - 05/10/19 0851    Clinical Impression Statement  Pt appears slightly weaker today and decreased endurance. Pt's daughter reports she is having a worse day.    Occupational performance deficits (Please refer to evaluation for details):  ADL's;IADL's;Leisure    Body Structure / Function / Physical Skills  ADL;ROM;Balance;IADL;Body mechanics;Improper spinal/pelvic alignment;Mobility;Strength;Flexibility;FMC;Coordination;UE functional use;Proprioception;Decreased knowledge of use of DME    Rehab Potential  Good    OT Frequency  2x / week     OT Duration  4 weeks    OT Treatment/Interventions  Self-care/ADL training;Therapeutic exercise;Functional Mobility Training;Neuromuscular education;Aquatic Therapy;Manual Therapy;Splinting;Therapeutic activities;Paraffin;DME and/or AE instruction;Cognitive remediation/compensation;Electrical Stimulation;Visual/perceptual remediation/compensation;Moist Heat;Passive range of motion;Patient/family education    Plan  supine: stretch LUE, functional mobility in kitchen as able    Consulted and Agree with Plan of Care  Patient;Family member/caregiver    Family Member Consulted  daughter       Patient will benefit from skilled therapeutic intervention in order to improve the following deficits and impairments:   Body Structure / Function / Physical Skills: ADL, ROM, Balance, IADL, Body mechanics, Improper spinal/pelvic alignment, Mobility, Strength, Flexibility, FMC, Coordination, UE functional use, Proprioception, Decreased knowledge of use of DME       Visit Diagnosis: Hemiplegia and hemiparesis following cerebral infarction affecting left non-dominant side (HCC)  Unsteadiness on feet  Other lack of coordination  Stiffness of left shoulder, not elsewhere classified    Problem List Patient Active Problem List   Diagnosis Date Noted  . Diverticulitis of large intestine with perforation without abscess or bleeding 04/16/2019  . Iron deficiency anemia 02/03/2019  . Pelvic fracture ([place]) 09/21/2018  . Allergic reaction caused by a drug 05/06/2018  . Bilateral leg edema 04/13/2018  . HLD (hyperlipidemia) 03/23/2018  . Mood swings 03/23/2018  . Chronic kidney disease (CKD), stage III (moderate)   . Embolic stroke ([place]) 76/72/0947  . Left hemiparesis ([place])   . Diabetes mellitus type 2 in nonobese (HCC)   . Acute ischemic stroke ([place])   . Left arm weakness   . Acute CVA (cerebrovascular accident) ([place]) 02/24/2018  . Overweight (BMI 25.0-29.9) 02/24/2018  . CKD (chronic kidney disease),  stage III 02/24/2018  . Diabetes mellitus without complication ([place]) 09/62/8366  . HTN (hypertension) 09/02/2017    [name], [name] 05/10/2019, 8:53 AM  [facility] [address] [place], [place], [address] Phone: [phone]   Fax:  [phone]  Name: [name] MRN: [id number] Date of Birth: 1945/09/15

## 2019-05-10 NOTE — Therapy (Signed)
[facility] [address] [place], [place], [address] Phone: [phone]   Fax:  [phone]  Physical Therapy Treatment  Patient Details  Name: [name] MRN: [id number] Date of Birth: [birth year] Referring Provider (PT): Alysia Penna, MD   Encounter Date: 05/10/2019  PT End of Session - 05/10/19 1218    Visit Number  45    Number of Visits  60    Date for PT Re-Evaluation  06/16/19    Authorization Type  UHC Medicare, 10th visit progress note    PT Start Time  0845    PT Stop Time  0927    PT Time Calculation (min)  42 min    Activity Tolerance  Patient tolerated treatment well;Patient limited by fatigue    Behavior During Therapy  [facility] for tasks assessed/performed       Past Medical History:  Diagnosis Date  . Allergy   . Arthritis   . Cataract   . Diabetes mellitus without complication ([place])   . Hypertension   . Macular degeneration syndrome    with edema---being treated.     Past Surgical History:  Procedure Laterality Date  . callous removal  Left 2017   located on 1st digit on L foot 2/2 diabetes  . EYE SURGERY      There were no vitals filed for this visit.  Subjective Assessment - 05/10/19 0848    Subjective  No falls. Is feeling more tired today and wants to take things easy.    Pertinent History  Patient had a R CVA 02/2018 and was walking with a SPC prior to a fall in March 2020 resulting in being hospitalized with multiple pelvic fxs and a L hip fx. During hospital stay, patient acquired pressure ulcers on bilateral soles of feet near heels.Patient is now safely able to bear weight and pressure ulcers are now a stage 1 (per daughter report and photos).    Limitations  Standing;Walking;House hold activities    Patient Stated Goals  "Be back to normal, get back to walking, and getting strength back"    Currently in Pain?  No/denies                      [facility] Adult PT  Treatment/Exercise - 05/10/19 1232      Bed Mobility   Rolling Right  Supervision/verbal cueing    Rolling Left  Supervision/Verbal cueing    Supine to Sit  Minimal Assistance - Patient > 75%   due to L shoulder pain, min A at trunk/pelvis     Transfers   Comments  stand step transfer from w/c <> bed, min guard during 1st transfer at beginning of session, min A at end of session with pt needing single HHA from therapist, pt performs today with increased forward flexed posture.      Ambulation/Gait   Ambulation/Gait  Yes    Ambulation/Gait Assistance  4: Min guard    Ambulation/Gait Assistance Details  Pt received at mat table at end of OT session, unable to ambulate today back from waiting room. Pt only able to ambulate 35' today due to fatigue. Cues for posture and step length/foot clearance on L    Ambulation Distance (Feet)  35 Feet    Assistive device  Rolling walker    Gait Pattern  Step-through pattern;Trunk flexed;Decreased weight shift to left;Poor foot clearance - left    Ambulation Surface  Level;Indoor      Exercises  Other Exercises   R sidelying: hip flexion stretch 5 x 30 seconds, and PROM into hip flexion/extension to pt's tolerance          Access Code: [email or web address]  URL: [email or web address]  Date: 05/10/2019  Prepared by: Janann August   Reviewed bolded exercises below:   Exercises Sit to Stand with Counter Support - 10 reps - 1 sets - 2x daily - 5x weekly Supine Bridge with Resistance Band - 10 reps - 1 sets - 1x daily - 5x weekly - pt reports she has not been performing this one at home, reviewed with red theraband around distal thighs cues to gently press into band before lifting hips off table and to press up first with left hip, 2 x 5 reps  Modified Thomas Stretch - 2 sets - 60 second hold - 1x daily - 7x weekly  90/90 Lower Trunk Rotation - 10 reps - 2 sets - 1x daily - 7x weekly Standing March with Counter Support - 5 reps - 1 sets - 3  second hold - 1x daily - 5x weekly Mini Squat with Counter Support - 10 reps - 1 sets - 1x daily - 5x weekly Standing Gastroc Stretch on Step with Counter Support - 4 sets - 30 seconds hold - 1x daily - 7x weekly Supine Single Knee to Chest - 3 reps - 1 sets - 30 seconds hold - 1x daily - 7x weekly Seated Hamstring Stretch - 3 reps - 1 sets - 30 sec hold - 2x daily - 7x weekly - cues for technique, and holding for 30 seconds, having heel planted on floor/step and sitting upright first and then hinging from hips. Vs. Increased forward flexion.  Seated Piriformis Stretch - 3 reps - 1 sets - 30 sec hold - 2x daily - 7x weekly Straight leg bridge with feet over wedge - 10 reps - 2 sets - 2-3 seconds hold - 2x daily - 7x weekly Standing Quarter Turn with Counter Support - 5 reps - 1 sets - 1x daily - 7x weekly Wide Stance with Eyes Closed - 2 sets - 10 second hold - 1x daily - 7x weekly      PT Education - 05/10/19 1218    Education Details  reviewed stretches for HEP and bridging exercise with red theraband.       PT Short Term Goals - 04/17/19 1231      PT SHORT TERM GOAL #1   Title  Pt will demonstrate ability to perform ongoing HEP (stretching and standing exercises) with husband's supervision    Status  Revised    Target Date  05/17/19      PT SHORT TERM GOAL #2   Title  Patient will increase BERG score to at least a 40/56 in order to help decrease risk of future falls.    Baseline  37/56    Status  Revised    Target Date  05/17/19      PT SHORT TERM GOAL #3   Title  Pt will consistently ambulate with RW to/from treatment area with supervision    Time  4    Period  Weeks    Status  New    Target Date  05/17/19      PT SHORT TERM GOAL #4   Title  Pt will improve gait velocity with LRAD to >/= 1.0 ft/sec    Baseline  .83 ft/sec with RW    Time  4    Period  Weeks  Status  Revised    Target Date  05/17/19      PT SHORT TERM GOAL #5   Title  Patient will negotiate 4  stairs with 2 rails, step to sequence with supervision    Time  4    Period  Weeks    Status  New    Target Date  05/17/19        PT Long Term Goals - 04/16/19 0807      PT LONG TERM GOAL #1   Title  Patient will report performing HEP atleast 5 days a week with husband's supervision    Baseline  04/13/19: met with current program    Status  Revised    Target Date  06/16/19      PT LONG TERM GOAL #2   Title  Pt will ambulate 230' with LRAD over indoor surfaces with supervision and will report ambulating to/from the bathroom with RW and supervision at home    Baseline  04/16/19: max distance of 115 feet before needed a rest break with supervision to min guard assist with RW    Time  8    Period  Weeks    Status  Revised    Target Date  06/16/19      PT LONG TERM GOAL #3   Title  Patient will increase BERG score to at least 44/56 in order to decrease risk of future falls.    Baseline  --    Time  8    Period  Weeks    Status  Revised    Target Date  06/16/19      PT LONG TERM GOAL #4   Title  Pt will perform sit <> stand and stand pivots with LRAD MOD I    Baseline  04/13/19: supervision for sit<>stand and stand pivot tranfers on consistent basis, improved from occasional need of min assist    Time  8    Period  Weeks    Status  Revised    Target Date  06/16/19      PT LONG TERM GOAL #5   Title  Patient will increase gait speed to at least 1.8t/sec with LRAD in order to decrease risk of falls and improve household ambulation.    Baseline  --    Time  8    Period  Weeks    Status  Revised    Target Date  06/16/19      PT LONG TERM GOAL #6   Title  Pt will ambulate from car <> clinic waiting area (with car pulled up to entrance) with RW and min guard leaving w/c completely in car    Baseline  --    Time  8    Period  Weeks    Status  New    Target Date  06/16/19            Plan - 05/10/19 1222    Clinical Impression Statement  Patient reported increased  fatigue this morning and was not up for a strenuous session. Able to ambulate only approx. 57' today with RW and supervision before needing to have a seated rest break. Pt with increased forward flexed posture today during gait and stand step transfers. Remainder of session focused on reviewing stretching/bridging exercises from HEP and sidelying hip flexor stretch/PROM. Pt reported feeling much better at end of session. Will check STGs at next visit.    PT Frequency  2x / week    PT  Duration  8 weeks    PT Treatment/Interventions  ADLs/Self Care Home Management;Gait training;DME Instruction;Stair training;Functional mobility training;Therapeutic activities;Patient/family education;Neuromuscular re-education;Balance training;Therapeutic exercise;Passive range of motion;Orthotic Fit/Training    PT Next Visit Plan  **check STGs (unable to begin to check today due to pt having increased fatigue/decr activity tolerance). WALKING TO/FROM TREATMENT AREA FROM WAITING AREA -PROGRESS AWAY FROM W/C. standing balance with eyes closed/head turns, hip strategy, step ups to L lateral and forward for weight shifting. .    PT Home Exercise Plan  [email or web address]    Consulted and Agree with Plan of Care  Patient;Family member/caregiver    Family Member Consulted  daughter, [name]       Patient will benefit from skilled therapeutic intervention in order to improve the following deficits and impairments:  Abnormal gait, Decreased activity tolerance, Decreased balance, Decreased range of motion, Decreased mobility, Decreased endurance, Decreased skin integrity, Decreased strength, Difficulty walking, Impaired perceived functional ability, Postural dysfunction  Visit Diagnosis: Hemiplegia and hemiparesis following cerebral infarction affecting left non-dominant side (HCC)  Unsteadiness on feet  Other lack of coordination  Other abnormalities of gait and mobility  Muscle weakness (generalized)     Problem List Patient  Active Problem List   Diagnosis Date Noted  . Diverticulitis of large intestine with perforation without abscess or bleeding 04/16/2019  . Iron deficiency anemia 02/03/2019  . Pelvic fracture ([place]) 09/21/2018  . Allergic reaction caused by a drug 05/06/2018  . Bilateral leg edema 04/13/2018  . HLD (hyperlipidemia) 03/23/2018  . Mood swings 03/23/2018  . Chronic kidney disease (CKD), stage III (moderate)   . Embolic stroke ([place]) 21/05/7355  . Left hemiparesis ([place])   . Diabetes mellitus type 2 in nonobese (HCC)   . Acute ischemic stroke ([facility])   . Left arm weakness   . Acute CVA (cerebrovascular accident) ([place]) 02/24/2018  . Overweight (BMI 25.0-29.9) 02/24/2018  . CKD (chronic kidney disease), stage III 02/24/2018  . Diabetes mellitus without complication ([place]) 70/14/1030  . HTN (hypertension) 09/02/2017    Arliss Journey, PT, DPT  05/10/2019, 1:30 PM  [facility] [address] [place], [place], [address] Phone: [phone]   Fax:  [phone]  Name: [name] MRN: [id number] Date of Birth: 1945/10/18

## 2019-05-11 ENCOUNTER — Ambulatory Visit: Payer: Medicare Other | Admitting: Physical Therapy

## 2019-05-13 ENCOUNTER — Other Ambulatory Visit: Payer: Self-pay

## 2019-05-13 ENCOUNTER — Encounter: Payer: Self-pay | Admitting: Family Medicine

## 2019-05-13 ENCOUNTER — Ambulatory Visit: Payer: Medicare Other | Admitting: Physical Therapy

## 2019-05-13 ENCOUNTER — Encounter: Payer: Self-pay | Admitting: Physical Therapy

## 2019-05-13 DIAGNOSIS — M6281 Muscle weakness (generalized): Secondary | ICD-10-CM

## 2019-05-13 DIAGNOSIS — G8929 Other chronic pain: Secondary | ICD-10-CM | POA: Diagnosis not present

## 2019-05-13 DIAGNOSIS — R2689 Other abnormalities of gait and mobility: Secondary | ICD-10-CM | POA: Diagnosis not present

## 2019-05-13 DIAGNOSIS — M25512 Pain in left shoulder: Secondary | ICD-10-CM | POA: Diagnosis not present

## 2019-05-13 DIAGNOSIS — R2681 Unsteadiness on feet: Secondary | ICD-10-CM

## 2019-05-13 DIAGNOSIS — R278 Other lack of coordination: Secondary | ICD-10-CM | POA: Diagnosis not present

## 2019-05-13 DIAGNOSIS — M25612 Stiffness of left shoulder, not elsewhere classified: Secondary | ICD-10-CM | POA: Diagnosis not present

## 2019-05-13 DIAGNOSIS — I69354 Hemiplegia and hemiparesis following cerebral infarction affecting left non-dominant side: Secondary | ICD-10-CM

## 2019-05-13 NOTE — Therapy (Signed)
[facility] [address] [place], [place], [address] Phone: [phone]   Fax:  [phone]  Physical Therapy Treatment  Patient Details  Name: [name] MRN: [id number] Date of Birth: 04/08/1946 Referring Provider (PT): Alysia Penna, MD   Encounter Date: 05/13/2019  PT End of Session - 05/13/19 1208    Visit Number  11    Number of Visits  61    Date for PT Re-Evaluation  06/16/19    Authorization Type  [employer] Medicare, 10th visit progress note    PT Start Time  0805    PT Stop Time  0846    PT Time Calculation (min)  41 min    Activity Tolerance  Patient limited by fatigue    Behavior During Therapy  [facility] for tasks assessed/performed       Past Medical History:  Diagnosis Date  . Allergy   . Arthritis   . Cataract   . Diabetes mellitus without complication ([place])   . Hypertension   . Macular degeneration syndrome    with edema---being treated.     Past Surgical History:  Procedure Laterality Date  . callous removal  Left 2017   located on 1st digit on L foot 2/2 diabetes  . EYE SURGERY      There were no vitals filed for this visit.  Subjective Assessment - 05/13/19 0808    Subjective  Diverticulitis has flared up.  Feeling a little better.  May be getting a CT scan, is still on antibiotic.    Pertinent History  Patient had a R CVA 02/2018 and was walking with a SPC prior to a fall in March 2020 resulting in being hospitalized with multiple pelvic fxs and a L hip fx. During hospital stay, patient acquired pressure ulcers on bilateral soles of feet near heels.Patient is now safely able to bear weight and pressure ulcers are now a stage 1 (per daughter report and photos).    Limitations  Standing;Walking;House hold activities    Patient Stated Goals  "Be back to normal, get back to walking, and getting strength back"    Currently in Pain?  No/denies         [facility] PT Assessment - 05/13/19 0814       Standardized Balance Assessment   Standardized Balance Assessment  10 meter walk test;Berg Balance Test    10 Meter Walk  2:58 minutes with 2 seated rest breaks due to fatigue.  .21 ft/sec      Berg Balance Test   Sit to Stand  Needs minimal aid to stand or to stabilize    Standing Unsupported  Able to stand 30 seconds unsupported    Sitting with Back Unsupported but Feet Supported on Floor or Stool  Able to sit safely and securely 2 minutes    Stand to Sit  Controls descent by using hands    Transfers  Able to transfer safely, definite need of hands    Standing Unsupported with Eyes Closed  Able to stand 3 seconds    Standing Unsupported with Feet Together  Needs help to attain position and unable to hold for 15 seconds    From Standing, Reach Forward with Outstretched Arm  Can reach forward >5 cm safely (2")    From Standing Position, Pick up Object from Floor  Unable to try/needs assist to keep balance    From Standing Position, Turn to Look Behind Over each Shoulder  Needs supervision when turning  Turn 360 Degrees  Needs assistance while turning    Standing Unsupported, Alternately Place Feet on Step/Stool  Needs assistance to keep from falling or unable to try    Standing Unsupported, One Foot in Front  Needs help to step but can hold 15 seconds    Standing on One Leg  Unable to try or needs assist to prevent fall    Total Score  19    Berg comment:  19/56 - high falls risk.  Declined due to significant fatigue from diverticulitis flare                           PT Education - 05/13/19 1207    Education Details  decline in function due to deconditioning; importance of maintaining some level of activity at home - short walks with supervision, standing with supervision to prevent further decline.    Person(s) Educated  Patient    Methods  Explanation    Comprehension  Verbalized understanding       PT Short Term Goals - 05/13/19 0812      PT SHORT  TERM GOAL #1   Title  Pt will demonstrate ability to perform ongoing HEP (stretching and standing exercises) with husband's supervision    Status  Achieved    Target Date  05/17/19      PT SHORT TERM GOAL #2   Title  Patient will increase BERG score to at least a 40/56 in order to help decrease risk of future falls.    Baseline  37/56 > declined to 19/56    Status  Not Met    Target Date  05/17/19      PT SHORT TERM GOAL #3   Title  Pt will consistently ambulate with RW to/from treatment area with supervision    Baseline  has been doing consistently except this past week when diverticulitis flared    Time  4    Period  Weeks    Status  Achieved    Target Date  05/17/19      PT SHORT TERM GOAL #4   Title  Pt will improve gait velocity with LRAD to >/= 1.0 ft/sec    Baseline  .83 ft/sec with RW > declined to .21 ft/sec    Time  4    Period  Weeks    Status  Not Met    Target Date  05/17/19      PT SHORT TERM GOAL #5   Title  Patient will negotiate 4 stairs with 2 rails, step to sequence with supervision    Baseline  self reporting - doing each day with daughter's supervision    Time  4    Period  Weeks    Status  Achieved    Target Date  05/17/19        PT Long Term Goals - 04/16/19 0807      PT LONG TERM GOAL #1   Title  Patient will report performing HEP atleast 5 days a week with husband's supervision    Baseline  04/13/19: met with current program    Status  Revised    Target Date  06/16/19      PT LONG TERM GOAL #2   Title  Pt will ambulate 230' with LRAD over indoor surfaces with supervision and will report ambulating to/from the bathroom with RW and supervision at home    Baseline  04/16/19: max distance of 115 feet before  needed a rest break with supervision to min guard assist with RW    Time  8    Period  Weeks    Status  Revised    Target Date  06/16/19      PT LONG TERM GOAL #3   Title  Patient will increase BERG score to at least 44/56 in order to  decrease risk of future falls.    Baseline  --    Time  8    Period  Weeks    Status  Revised    Target Date  06/16/19      PT LONG TERM GOAL #4   Title  Pt will perform sit <> stand and stand pivots with LRAD MOD I    Baseline  04/13/19: supervision for sit<>stand and stand pivot tranfers on consistent basis, improved from occasional need of min assist    Time  8    Period  Weeks    Status  Revised    Target Date  06/16/19      PT LONG TERM GOAL #5   Title  Patient will increase gait speed to at least 1.8t/sec with LRAD in order to decrease risk of falls and improve household ambulation.    Baseline  --    Time  8    Period  Weeks    Status  Revised    Target Date  06/16/19      PT LONG TERM GOAL #6   Title  Pt will ambulate from car <> clinic waiting area (with car pulled up to entrance) with RW and min guard leaving w/c completely in car    Baseline  --    Time  8    Period  Weeks    Status  New    Target Date  06/16/19            Plan - 05/13/19 1211    Clinical Impression Statement  Pt continues to feel significantly fatigued from diverticulitis flare and due to this pt has been spending most of her time in bed or in a chair at home.  Pt has experienced a significant decline in standing balance and overall function as indicated by decline in gait velocity, endurance while ambulating and BERG score.  Pt is at high risk for falling; encouraged pt to continue to perform what she can at home with supervision to prevent further decline.  Pt continues to undergo medical work up for diverticulitis; will continue to address impairments and return to baseline as pt is able to tolerate.  Will continue to work towards goals and increased independence.    PT Frequency  2x / week    PT Duration  8 weeks    PT Treatment/Interventions  ADLs/Self Care Home Management;Gait training;DME Instruction;Stair training;Functional mobility training;Therapeutic activities;Patient/family  education;Neuromuscular re-education;Balance training;Therapeutic exercise;Passive range of motion;Orthotic Fit/Training    PT Next Visit Plan  WALKING TO/FROM TREATMENT AREA FROM WAITING AREA -PROGRESS AWAY FROM W/C. Slow progress pt back to baseline with standing exercises and ambulation as pt is recovering from diverticulitis; Nustep for endurance; standing balance with eyes closed/head turns, hip strategy, step ups to L lateral and forward step ups for weight shifting. .    PT Home Exercise Plan  [id number]    Consulted and Agree with Plan of Care  Patient;Family member/caregiver    Family Member Consulted  daughter, [name]       Patient will benefit from skilled therapeutic intervention in order to improve the  following deficits and impairments:  Abnormal gait, Decreased activity tolerance, Decreased balance, Decreased range of motion, Decreased mobility, Decreased endurance, Decreased skin integrity, Decreased strength, Difficulty walking, Impaired perceived functional ability, Postural dysfunction  Visit Diagnosis: Hemiplegia and hemiparesis following cerebral infarction affecting left non-dominant side (HCC)  Unsteadiness on feet  Other abnormalities of gait and mobility  Muscle weakness (generalized)     Problem List Patient Active Problem List   Diagnosis Date Noted  . Diverticulitis of large intestine with perforation without abscess or bleeding 04/16/2019  . Iron deficiency anemia 02/03/2019  . Pelvic fracture ([place]) 09/21/2018  . Allergic reaction caused by a drug 05/06/2018  . Bilateral leg edema 04/13/2018  . HLD (hyperlipidemia) 03/23/2018  . Mood swings 03/23/2018  . Chronic kidney disease (CKD), stage III (moderate)   . Embolic stroke ([place]) 75/43/6067  . Left hemiparesis ([place])   . Diabetes mellitus type 2 in nonobese (HCC)   . Acute ischemic stroke ([place])   . Left arm weakness   . Acute CVA (cerebrovascular accident) ([place]) 02/24/2018  . Overweight (BMI 25.0-29.9)  02/24/2018  . CKD (chronic kidney disease), stage III 02/24/2018  . Diabetes mellitus without complication ([place]) 70/34/0352  . HTN (hypertension) 09/02/2017    Rico Junker, PT, DPT 05/13/19    12:31 PM    [facility] [address] [place], [place], [address] Phone: [phone]   Fax:  [phone]  Name: [name] MRN: [id number] Date of Birth: 01/10/1946

## 2019-05-14 ENCOUNTER — Ambulatory Visit: Payer: Medicare Other | Admitting: Physical Therapy

## 2019-05-14 ENCOUNTER — Ambulatory Visit: Payer: Medicare Other | Admitting: Occupational Therapy

## 2019-05-14 DIAGNOSIS — R2681 Unsteadiness on feet: Secondary | ICD-10-CM

## 2019-05-14 DIAGNOSIS — M25612 Stiffness of left shoulder, not elsewhere classified: Secondary | ICD-10-CM | POA: Diagnosis not present

## 2019-05-14 DIAGNOSIS — R2689 Other abnormalities of gait and mobility: Secondary | ICD-10-CM | POA: Diagnosis not present

## 2019-05-14 DIAGNOSIS — G8929 Other chronic pain: Secondary | ICD-10-CM

## 2019-05-14 DIAGNOSIS — R278 Other lack of coordination: Secondary | ICD-10-CM

## 2019-05-14 DIAGNOSIS — M25512 Pain in left shoulder: Secondary | ICD-10-CM | POA: Diagnosis not present

## 2019-05-14 DIAGNOSIS — M6281 Muscle weakness (generalized): Secondary | ICD-10-CM | POA: Diagnosis not present

## 2019-05-14 DIAGNOSIS — I69354 Hemiplegia and hemiparesis following cerebral infarction affecting left non-dominant side: Secondary | ICD-10-CM | POA: Diagnosis not present

## 2019-05-14 NOTE — Therapy (Signed)
[facility] [address] [place], [place], [address] Phone: [phone]   Fax:  [phone]  Occupational Therapy Treatment  Patient Details  Name: [name] MRN: [id number] Date of Birth: Jun 21, 1946 Referring Provider (OT): Dr. Letta Pate   Encounter Date: 05/14/2019  OT End of Session - 05/14/19 0837    Visit Number  32    Number of Visits  38    Date for OT Re-Evaluation  06/07/19    Authorization Type  [employer] MCR    Authorization Time Period  cert. period 02/19/19-05/20/19    Authorization - Visit Number  66    Authorization - Number of Visits  40    OT Start Time  0805    OT Stop Time  0845    OT Time Calculation (min)  40 min    Activity Tolerance  Patient tolerated treatment well    Behavior During Therapy  WFL for tasks assessed/performed       Past Medical History:  Diagnosis Date  . Allergy   . Arthritis   . Cataract   . Diabetes mellitus without complication ([place])   . Hypertension   . Macular degeneration syndrome    with edema---being treated.     Past Surgical History:  Procedure Laterality Date  . callous removal  Left 2017   located on 1st digit on L foot 2/2 diabetes  . EYE SURGERY      There were no vitals filed for this visit.  Subjective Assessment - 05/14/19 0835    Subjective   Diverticulitis    Patient is accompanied by:  Family member    Pertinent History  CVA 02/27/18, pelvic fx and Lt hip  fx 09/2018. PMH: HTN, OA    Limitations  Fall risk, wounds on feet    Currently in Pain?  No/denies       Supine, chest press, horizontal abduction/adduction, shoulder flex with PVC frame with min facilitation/cues for positioning.  Sidelying, shoulder flex mid-high range with min facilitation/cueing for decr compensation.  Sitting>supine with min A, supine>sitting with min-mod A due to stomach pain today.    Sitting, low-range functional reaching to place various sized cylinder  pegs in arc pegboard.  Pt with mod difficulty with small pegs (picking up and fatigue), but improved with shelf liner underneath to assist with grasp.  Pt demo improved in-hand manipulation.  Recommended pt put oven mitt over R hand when sitting at home to incr LUE functional use and even try typical dominant tasks (like eating ice cream) that may be motivating to incr LUE functional use.  Pt/dtr verbalized understanding.         OT Short Term Goals - 04/20/19 1414      OT SHORT TERM GOAL #1   Title  Independent with initial HEP for shoulder ROM, and functional reaching LUE    Time  4    Period  Weeks    Status  Achieved      OT SHORT TERM GOAL #2   Title  Pt to perform UB dressing with set up only and LE dressing with mod assist or less using A/E prn    Time  4    Period  Weeks    Status  Achieved      OT SHORT TERM GOAL #3   Title  Pt to increase LUE functional use as evidenced by performing 15 blocks on Box & Blocks test.--STGs due 03/21/19   (modified 03/19/19)  Baseline  11 blocks    Time  4    Period  Weeks    Status  Achieved   01/27/19:  11 blocks, 02/17/19:  13, 03/19/19 10 blocks, 04/20/19 15 blocks     OT SHORT TERM GOAL #4   Title  Pt to perform toileting/clothes management with only min assist    Period  Weeks    Status  Achieved   Able to complete - simulation -  in clinic.  01/27/19:  met per pt/dtr report     OT SHORT TERM GOAL #5   Title  Pt to achieve 90* shoulder flexion LUE for mid level reaching    Time  4    Period  Weeks    Status  Not Met   01/18/19:  75* with min compensation.  02/17/19:  85* with min compensation.  03/09/19: inconsistent. 03/25/19: 85* w/ compensations       OT Long Term Goals - 04/20/19 1415      OT LONG TERM GOAL #1   Title  Independent with updated HEP - due 04/20/19    Time  8    Period  Weeks    Status  Deferred      OT LONG TERM GOAL #2   Title  Pt to be mod I with all BADLS using A/E as needed (except shower transfers  to still be supervision)    Time  8    Period  Weeks    Status  Partially Met   02/17/19:  assist for compression stockings, min A for depends, supervision for shower transfer (mod I for bathing), toileting with BSC mod I     OT LONG TERM GOAL #3   Title  Pt to improve LUE function as evidenced by performing 18 blocks or greater on Box & Blocks test--revised 03/19/19    Time  4    Period  Weeks    Status  Revised   02/17/19: 13, 04/20/19: 15     OT LONG TERM GOAL #4   Title  Pt to improve coordination as evidenced by performing 9 hole peg test completely in 2 min. 30 sec or less    Baseline  placed 5 in 2 min.    Time  4    Period  Weeks    Status  Revised   02/17/19:  5 pegs within 23mn.  completed in 472m 43sec. 04/20/19: completed in 3 min. 28 sec.     OT LONG TERM GOAL #5   Title  Grip strength Lt hand to be 10 lbs or greater    Baseline  5 lbs    Time  4    Period  Weeks    Status  Revised   02/17/19:  5lbs, 04/20/19: 5 lbs     OT LONG TERM GOAL #6   Title  Pt to perform simple snack, cold food, and microwaveable food mod I level with DME prn without LOB    Time  4    Period  Weeks    Status  On-going   02/17/19:  not performing. 03/25/19: performed in clinic making sandwich w/ close sup (partly from w/c level, mostly from standing)     OT LONG TERM GOAL #7   Title  Pt to achieve 110* or greater P/ROM Lt shoulder with pain less than or equal to 5/10    Time  8    Period  Weeks    Status  Achieved   02/17/19:  110-115* in sitting with 4-5/10 pain           Plan - 05/14/19 0838    Clinical Impression Statement  Pt demo improved coordination today and demo incr ease/improved speed with low-range functional reach, but reports that she has been less active due to diverticulitis flare up last 2 weeks (improved today).    Occupational performance deficits (Please refer to evaluation for details):  ADL's;IADL's;Leisure    Body Structure / Function / Physical Skills   ADL;ROM;Balance;IADL;Body mechanics;Improper spinal/pelvic alignment;Mobility;Strength;Flexibility;FMC;Coordination;UE functional use;Proprioception;Decreased knowledge of use of DME    Rehab Potential  Good    OT Frequency  2x / week    OT Duration  4 weeks    OT Treatment/Interventions  Self-care/ADL training;Therapeutic exercise;Functional Mobility Training;Neuromuscular education;Aquatic Therapy;Manual Therapy;Splinting;Therapeutic activities;Paraffin;DME and/or AE instruction;Cognitive remediation/compensation;Electrical Stimulation;Visual/perceptual remediation/compensation;Moist Heat;Passive range of motion;Patient/family education    Plan  functional mobility in kitchen as able    Consulted and Agree with Plan of Care  Patient;Family member/caregiver    Family Member Consulted  daughter       Patient will benefit from skilled therapeutic intervention in order to improve the following deficits and impairments:   Body Structure / Function / Physical Skills: ADL, ROM, Balance, IADL, Body mechanics, Improper spinal/pelvic alignment, Mobility, Strength, Flexibility, FMC, Coordination, UE functional use, Proprioception, Decreased knowledge of use of DME       Visit Diagnosis: Hemiplegia and hemiparesis following cerebral infarction affecting left non-dominant side (HCC)  Unsteadiness on feet  Other abnormalities of gait and mobility  Muscle weakness (generalized)  Other lack of coordination  Stiffness of left shoulder, not elsewhere classified  Chronic left shoulder pain    Problem List Patient Active Problem List   Diagnosis Date Noted  . Diverticulitis of large intestine with perforation without abscess or bleeding 04/16/2019  . Iron deficiency anemia 02/03/2019  . Pelvic fracture ([place]) 09/21/2018  . Allergic reaction caused by a drug 05/06/2018  . Bilateral leg edema 04/13/2018  . HLD (hyperlipidemia) 03/23/2018  . Mood swings 03/23/2018  . Chronic kidney disease  (CKD), stage III (moderate)   . Embolic stroke ([place]) 46/50/3546  . Left hemiparesis ([place])   . Diabetes mellitus type 2 in nonobese (HCC)   . Acute ischemic stroke ([place])   . Left arm weakness   . Acute CVA (cerebrovascular accident) ([place]) 02/24/2018  . Overweight (BMI 25.0-29.9) 02/24/2018  . CKD (chronic kidney disease), stage III 02/24/2018  . Diabetes mellitus without complication ([place]) 56/81/2751  . HTN (hypertension) 09/02/2017    [facility] 05/14/2019, 9:13 AM  [facility] [address] [place], [place], [address] Phone: [phone]   Fax:  [phone]  Name: [name] MRN: [id number] Date of Birth: Nov 27, 1945   Vianne Bulls, OTR/L [facility] [address] [place], [place]  [address] [phone] phone [phone] 05/14/19 9:13 AM

## 2019-05-17 ENCOUNTER — Ambulatory Visit: Payer: Medicare Other | Admitting: Podiatry

## 2019-05-18 ENCOUNTER — Other Ambulatory Visit: Payer: Self-pay

## 2019-05-18 ENCOUNTER — Ambulatory Visit: Payer: Medicare Other | Admitting: Physical Therapy

## 2019-05-18 ENCOUNTER — Ambulatory Visit: Payer: Medicare Other | Admitting: Occupational Therapy

## 2019-05-18 DIAGNOSIS — M6281 Muscle weakness (generalized): Secondary | ICD-10-CM | POA: Diagnosis not present

## 2019-05-18 DIAGNOSIS — M25612 Stiffness of left shoulder, not elsewhere classified: Secondary | ICD-10-CM | POA: Diagnosis not present

## 2019-05-18 DIAGNOSIS — R2681 Unsteadiness on feet: Secondary | ICD-10-CM

## 2019-05-18 DIAGNOSIS — R278 Other lack of coordination: Secondary | ICD-10-CM

## 2019-05-18 DIAGNOSIS — G8929 Other chronic pain: Secondary | ICD-10-CM | POA: Diagnosis not present

## 2019-05-18 DIAGNOSIS — I69354 Hemiplegia and hemiparesis following cerebral infarction affecting left non-dominant side: Secondary | ICD-10-CM | POA: Diagnosis not present

## 2019-05-18 DIAGNOSIS — R2689 Other abnormalities of gait and mobility: Secondary | ICD-10-CM | POA: Diagnosis not present

## 2019-05-18 DIAGNOSIS — M25512 Pain in left shoulder: Secondary | ICD-10-CM | POA: Diagnosis not present

## 2019-05-18 NOTE — Therapy (Signed)
[facility] [address] [place], [place], [address] Phone: [phone]   Fax:  [phone]  Occupational Therapy Treatment  Patient Details  Name: [name] MRN: [id number] Date of Birth: 02/04/1946 Referring Provider (OT): Dr. Kirsteins   Encounter Date: 05/18/2019  OT End of Session - 05/18/19 0840    Visit Number  33    Number of Visits  38    Date for OT Re-Evaluation  06/07/19    Authorization Type  [employer] MCR    Authorization Time Period  week 2/4 (from last renewal)    Authorization - Visit Number  33    Authorization - Number of Visits  40    OT Start Time  0800    OT Stop Time  0845    OT Time Calculation (min)  45 min    Activity Tolerance  Patient tolerated treatment well    Behavior During Therapy  WFL for tasks assessed/performed       Past Medical History:  Diagnosis Date  . Allergy   . Arthritis   . Cataract   . Diabetes mellitus without complication (HCC)   . Hypertension   . Macular degeneration syndrome    with edema---being treated.     Past Surgical History:  Procedure Laterality Date  . callous removal  Left 2017   located on 1st digit on L foot 2/2 diabetes  . EYE SURGERY      There were no vitals filed for this visit.  Subjective Assessment - 05/18/19 0807    Subjective   She is better than last week.    Patient is accompanied by:  Family member   daughter   Pertinent History  CVA 02/27/18, pelvic fx and Lt hip  fx 09/2018. PMH: HTN, OA    Limitations  Fall risk, wounds on feet    Currently in Pain?  No/denies        Pt standing for approx 20-25 min w/ 3 rest breaks required during kitchen mobility tasks: pt practiced side stepping along counter to get item out of lower cabinet and higher cabinet, then replaced items before sitting. After rest break pt made sandwich in standing gathering items needed in standing except bread from refrigerator, which she did from w/c  level. Pt then rested again before standing to clean area. Gait belt and CGA required for safety and w/c positioned behind pt.  Pt seated to place large pegs in pegboard Lt hand w/ cues for correct pincer grasp and placing peg in vertically vs. Angle. Pt required extra time and was only able to complete 1 full row in 15 minutes                     OT Short Term Goals - 04/20/19 1414      OT SHORT TERM GOAL #1   Title  Independent with initial HEP for shoulder ROM, and functional reaching LUE    Time  4    Period  Weeks    Status  Achieved      OT SHORT TERM GOAL #2   Title  Pt to perform UB dressing with set up only and LE dressing with mod assist or less using A/E prn    Time  4    Period  Weeks    Status  Achieved      OT SHORT TERM GOAL #3   Title  Pt to increase LUE functional use as evidenced by   performing 15 blocks on Box & Blocks test.--STGs due 03/21/19   (modified 03/19/19)    Baseline  11 blocks    Time  4    Period  Weeks    Status  Achieved   01/27/19:  11 blocks, 02/17/19:  13, 03/19/19 10 blocks, 04/20/19 15 blocks     OT SHORT TERM GOAL #4   Title  Pt to perform toileting/clothes management with only min assist    Period  Weeks    Status  Achieved   Able to complete - simulation -  in clinic.  01/27/19:  met per pt/dtr report     OT SHORT TERM GOAL #5   Title  Pt to achieve 90* shoulder flexion LUE for mid level reaching    Time  4    Period  Weeks    Status  Not Met   01/18/19:  75* with min compensation.  02/17/19:  85* with min compensation.  03/09/19: inconsistent. 03/25/19: 85* w/ compensations       OT Long Term Goals - 04/20/19 1415      OT LONG TERM GOAL #1   Title  Independent with updated HEP - due 04/20/19    Time  8    Period  Weeks    Status  Deferred      OT LONG TERM GOAL #2   Title  Pt to be mod I with all BADLS using A/E as needed (except shower transfers to still be supervision)    Time  8    Period  Weeks    Status   Partially Met   02/17/19:  assist for compression stockings, min A for depends, supervision for shower transfer (mod I for bathing), toileting with BSC mod I     OT LONG TERM GOAL #3   Title  Pt to improve LUE function as evidenced by performing 18 blocks or greater on Box & Blocks test--revised 03/19/19    Time  4    Period  Weeks    Status  Revised   02/17/19: 13, 04/20/19: 15     OT LONG TERM GOAL #4   Title  Pt to improve coordination as evidenced by performing 9 hole peg test completely in 2 min. 30 sec or less    Baseline  placed 5 in 2 min.    Time  4    Period  Weeks    Status  Revised   02/17/19:  5 pegs within 68mn.  completed in 438m 43sec. 04/20/19: completed in 3 min. 28 sec.     OT LONG TERM GOAL #5   Title  Grip strength Lt hand to be 10 lbs or greater    Baseline  5 lbs    Time  4    Period  Weeks    Status  Revised   02/17/19:  5lbs, 04/20/19: 5 lbs     OT LONG TERM GOAL #6   Title  Pt to perform simple snack, cold food, and microwaveable food mod I level with DME prn without LOB    Time  4    Period  Weeks    Status  On-going   02/17/19:  not performing. 03/25/19: performed in clinic making sandwich w/ close sup (partly from w/c level, mostly from standing)     OT LONG TERM GOAL #7   Title  Pt to achieve 110* or greater P/ROM Lt shoulder with pain less than or equal to 5/10    Time  8    Period  Weeks    Status  Achieved   02/17/19:  110-115* in sitting with 4-5/10 pain           Plan - 05/18/19 0842    Clinical Impression Statement  Pt with increased standing tolerance/endurance today.    Occupational performance deficits (Please refer to evaluation for details):  ADL's;IADL's;Leisure    Body Structure / Function / Physical Skills  ADL;ROM;Balance;IADL;Body mechanics;Improper spinal/pelvic alignment;Mobility;Strength;Flexibility;FMC;Coordination;UE functional use;Proprioception;Decreased knowledge of use of DME    Rehab Potential  Good    Comorbidities  impacting occupational performance description:  Wounds bilateral feet, Healed recent hip and pelvic fx's    OT Frequency  2x / week    OT Duration  4 weeks    OT Treatment/Interventions  Self-care/ADL training;Therapeutic exercise;Functional Mobility Training;Neuromuscular education;Aquatic Therapy;Manual Therapy;Splinting;Therapeutic activities;Paraffin;DME and/or AE instruction;Cognitive remediation/compensation;Electrical Stimulation;Visual/perceptual remediation/compensation;Moist Heat;Passive range of motion;Patient/family education    Plan  supine: shoulder stretching, followed by functional use LUE in low ranges    Consulted and Agree with Plan of Care  Patient;Family member/caregiver    Family Member Consulted  daughter       Patient will benefit from skilled therapeutic intervention in order to improve the following deficits and impairments:   Body Structure / Function / Physical Skills: ADL, ROM, Balance, IADL, Body mechanics, Improper spinal/pelvic alignment, Mobility, Strength, Flexibility, FMC, Coordination, UE functional use, Proprioception, Decreased knowledge of use of DME       Visit Diagnosis: Unsteadiness on feet  Other lack of coordination    Problem List Patient Active Problem List   Diagnosis Date Noted  . Diverticulitis of large intestine with perforation without abscess or bleeding 04/16/2019  . Iron deficiency anemia 02/03/2019  . Pelvic fracture ([place]) 09/21/2018  . Allergic reaction caused by a drug 05/06/2018  . Bilateral leg edema 04/13/2018  . HLD (hyperlipidemia) 03/23/2018  . Mood swings 03/23/2018  . Chronic kidney disease (CKD), stage III (moderate)   . Embolic stroke ([place]) 45/62/5638  . Left hemiparesis ([place])   . Diabetes mellitus type 2 in nonobese (HCC)   . Acute ischemic stroke ([place])   . Left arm weakness   . Acute CVA (cerebrovascular accident) ([place]) 02/24/2018  . Overweight (BMI 25.0-29.9) 02/24/2018  . CKD (chronic kidney disease),  stage III 02/24/2018  . Diabetes mellitus without complication ([place]) 93/73/4287  . HTN (hypertension) 09/02/2017    Carey Bullocks, OTR/L 05/18/2019, 10:02 AM  [facility] [address] [place], [place], [address] Phone: [phone]   Fax:  [phone]  Name: NORBERTA STOBAUGH MRN: [id number] Date of Birth: [birth year]

## 2019-05-20 ENCOUNTER — Encounter: Payer: Self-pay | Admitting: Physical Therapy

## 2019-05-20 ENCOUNTER — Ambulatory Visit: Payer: Medicare Other | Admitting: Physical Therapy

## 2019-05-20 ENCOUNTER — Other Ambulatory Visit: Payer: Self-pay

## 2019-05-20 DIAGNOSIS — R2681 Unsteadiness on feet: Secondary | ICD-10-CM

## 2019-05-20 DIAGNOSIS — M6281 Muscle weakness (generalized): Secondary | ICD-10-CM | POA: Diagnosis not present

## 2019-05-20 DIAGNOSIS — R2689 Other abnormalities of gait and mobility: Secondary | ICD-10-CM | POA: Diagnosis not present

## 2019-05-20 DIAGNOSIS — R278 Other lack of coordination: Secondary | ICD-10-CM | POA: Diagnosis not present

## 2019-05-20 DIAGNOSIS — I69354 Hemiplegia and hemiparesis following cerebral infarction affecting left non-dominant side: Secondary | ICD-10-CM

## 2019-05-20 DIAGNOSIS — M25512 Pain in left shoulder: Secondary | ICD-10-CM | POA: Diagnosis not present

## 2019-05-20 DIAGNOSIS — M25612 Stiffness of left shoulder, not elsewhere classified: Secondary | ICD-10-CM | POA: Diagnosis not present

## 2019-05-20 DIAGNOSIS — G8929 Other chronic pain: Secondary | ICD-10-CM | POA: Diagnosis not present

## 2019-05-20 NOTE — Therapy (Signed)
[facility] [address] [place], [place], [address] Phone: [phone]   Fax:  [phone]  Physical Therapy Treatment  Patient Details  Name: MARIJKE GUADIANA MRN: [id number] Date of Birth: 1946-05-05 Referring Provider (PT): Alysia Penna, MD   Encounter Date: 05/20/2019  PT End of Session - 05/20/19 1038    Visit Number  69    Number of Visits  28    Date for PT Re-Evaluation  06/16/19    Authorization Type  [employer] Medicare, 10th visit progress note    PT Start Time  0845    PT Stop Time  0928    PT Time Calculation (min)  43 min    Activity Tolerance  Patient limited by fatigue;Patient tolerated treatment well    Behavior During Therapy  [facility] for tasks assessed/performed       Past Medical History:  Diagnosis Date  . Allergy   . Arthritis   . Cataract   . Diabetes mellitus without complication ([place])   . Hypertension   . Macular degeneration syndrome    with edema---being treated.     Past Surgical History:  Procedure Laterality Date  . callous removal  Left 2017   located on 1st digit on L foot 2/2 diabetes  . EYE SURGERY      There were no vitals filed for this visit.  Subjective Assessment - 05/20/19 0847    Subjective  States that the diverticulitis is more under control. Feeling better than she did last week. Won't be getting a CT scan.    Pertinent History  Patient had a R CVA 02/2018 and was walking with a SPC prior to a fall in March 2020 resulting in being hospitalized with multiple pelvic fxs and a L hip fx. During hospital stay, patient acquired pressure ulcers on bilateral soles of feet near heels.Patient is now safely able to bear weight and pressure ulcers are now a stage 1 (per daughter report and photos).    Limitations  Standing;Walking;House hold activities    Patient Stated Goals  "Be back to normal, get back to walking, and getting strength back"    Currently in Pain?  No/denies                        [facility] Adult PT Treatment/Exercise - 05/20/19 0001      Transfers   Sit to Stand  5: Supervision;4: Min guard    Sit to Stand Details  Verbal cues for sequencing;Verbal cues for technique    Sit to Stand Details (indicate cue type and reason)  Cues for anterior weight shift before standing and not using UE, approx. 8 reps throughout session from higher blue mat table on foam     Stand to Sit  5: Supervision    Stand to Sit Details  focus on eccentric control and no UE support.       Ambulation/Gait   Ambulation/Gait  Yes    Ambulation/Gait Assistance  4: Min guard    Ambulation/Gait Assistance Details  Cues for upright posture/looking up and left step length during gait, performed 115' in 3 bouts, needed seated rest breaks in w/c due to fatigue. (w/c follow throughout)    Ambulation Distance (Feet)  115 Feet   (in 3 bouts)   Assistive device  Rolling walker    Gait Pattern  Step-through pattern;Trunk flexed;Decreased weight shift to left;Poor foot clearance - left;Step-to pattern    Ambulation Surface  Level;Indoor       NMR: Standing at edge of mat with chair anteriorly and to right: -On blue foam: multiple reps for standing 45-1 minute bouts, wide BOS support balance - cues for upright posture/looking up and weight shifting to L with no UE support, with intermittent reps of asking pt to reach with RUE across body to tap target from therapist for increased weight shifting to L.   -On level floor: modified tandem stance B, 2 x 10 rep s       PT Education - 05/20/19 1038    Education Details  continued importance of standing/walker at home.    Person(s) Educated  Patient;Child(ren)    Methods  Explanation    Comprehension  Verbalized understanding       PT Short Term Goals - 05/13/19 0812      PT SHORT TERM GOAL #1   Title  Pt will demonstrate ability to perform ongoing HEP (stretching and standing exercises) with husband's supervision     Status  Achieved    Target Date  05/17/19      PT SHORT TERM GOAL #2   Title  Patient will increase BERG score to at least a 40/56 in order to help decrease risk of future falls.    Baseline  37/56 > declined to 19/56    Status  Not Met    Target Date  05/17/19      PT SHORT TERM GOAL #3   Title  Pt will consistently ambulate with RW to/from treatment area with supervision    Baseline  has been doing consistently except this past week when diverticulitis flared    Time  4    Period  Weeks    Status  Achieved    Target Date  05/17/19      PT SHORT TERM GOAL #4   Title  Pt will improve gait velocity with LRAD to >/= 1.0 ft/sec    Baseline  .83 ft/sec with RW > declined to .21 ft/sec    Time  4    Period  Weeks    Status  Not Met    Target Date  05/17/19      PT SHORT TERM GOAL #5   Title  Patient will negotiate 4 stairs with 2 rails, step to sequence with supervision    Baseline  self reporting - doing each day with daughter's supervision    Time  4    Period  Weeks    Status  Achieved    Target Date  05/17/19        PT Long Term Goals - 04/16/19 0807      PT LONG TERM GOAL #1   Title  Patient will report performing HEP atleast 5 days a week with husband's supervision    Baseline  04/13/19: met with current program    Status  Revised    Target Date  06/16/19      PT LONG TERM GOAL #2   Title  Pt will ambulate 230' with LRAD over indoor surfaces with supervision and will report ambulating to/from the bathroom with RW and supervision at home    Baseline  04/16/19: max distance of 115 feet before needed a rest break with supervision to min guard assist with RW    Time  8    Period  Weeks    Status  Revised    Target Date  06/16/19      PT LONG TERM GOAL #3  Title  Patient will increase BERG score to at least 44/56 in order to decrease risk of future falls.    Baseline  --    Time  8    Period  Weeks    Status  Revised    Target Date  06/16/19      PT LONG TERM  GOAL #4   Title  Pt will perform sit <> stand and stand pivots with LRAD MOD I    Baseline  04/13/19: supervision for sit<>stand and stand pivot tranfers on consistent basis, improved from occasional need of min assist    Time  8    Period  Weeks    Status  Revised    Target Date  06/16/19      PT LONG TERM GOAL #5   Title  Patient will increase gait speed to at least 1.8t/sec with LRAD in order to decrease risk of falls and improve household ambulation.    Baseline  --    Time  8    Period  Weeks    Status  Revised    Target Date  06/16/19      PT LONG TERM GOAL #6   Title  Pt will ambulate from car <> clinic waiting area (with car pulled up to entrance) with RW and min guard leaving w/c completely in car    Baseline  --    Time  8    Period  Weeks    Status  New    Target Date  06/16/19            Plan - 05/20/19 1044    Clinical Impression Statement  Pt with better participation in today's session vs. last week. Pt continued to need frequent seated rest breaks during gait with RW and standing balance due to fatigue. However pt was able to tolerate standing for 45-1 minute bouts for multiple reps  on blue foam and in total ambulated 115' during today's session. Will continue to progress towards LTGs and increased independence.    PT Frequency  2x / week    PT Duration  8 weeks    PT Treatment/Interventions  ADLs/Self Care Home Management;Gait training;DME Instruction;Stair training;Functional mobility training;Therapeutic activities;Patient/family education;Neuromuscular re-education;Balance training;Therapeutic exercise;Passive range of motion;Orthotic Fit/Training    PT Next Visit Plan  WALKING TO/FROM TREATMENT AREA FROM WAITING AREA -PROGRESS AWAY FROM W/C. Slow progress pt back to baseline with standing exercises and ambulation as pt is recovering from diverticulitis; Nustep for endurance; standing balance with eyes closed/head turns, hip strategy, step ups to L lateral and  forward step ups for weight shifting. .    PT Home Exercise Plan  [id number]    Consulted and Agree with Plan of Care  Patient;Family member/caregiver    Family Member Consulted  daughter, [name]       Patient will benefit from skilled therapeutic intervention in order to improve the following deficits and impairments:  Abnormal gait, Decreased activity tolerance, Decreased balance, Decreased range of motion, Decreased mobility, Decreased endurance, Decreased skin integrity, Decreased strength, Difficulty walking, Impaired perceived functional ability, Postural dysfunction  Visit Diagnosis: Unsteadiness on feet  Other lack of coordination  Hemiplegia and hemiparesis following cerebral infarction affecting left non-dominant side (HCC)  Other abnormalities of gait and mobility     Problem List Patient Active Problem List   Diagnosis Date Noted  . Diverticulitis of large intestine with perforation without abscess or bleeding 04/16/2019  . Iron deficiency anemia 02/03/2019  . Pelvic fracture (  [place]) 09/21/2018  . Allergic reaction caused by a drug 05/06/2018  . Bilateral leg edema 04/13/2018  . HLD (hyperlipidemia) 03/23/2018  . Mood swings 03/23/2018  . Chronic kidney disease (CKD), stage III (moderate)   . Embolic stroke ([place]) 98/42/1031  . Left hemiparesis ([place])   . Diabetes mellitus type 2 in nonobese (HCC)   . Acute ischemic stroke ([place])   . Left arm weakness   . Acute CVA (cerebrovascular accident) ([place]) 02/24/2018  . Overweight (BMI 25.0-29.9) 02/24/2018  . CKD (chronic kidney disease), stage III 02/24/2018  . Diabetes mellitus without complication ([place]) 28/05/8866  . HTN (hypertension) 09/02/2017    Arliss Journey, PT, DPT  05/20/2019, 10:46 AM  [facility] [address] [place], [place], [address] Phone: [phone]   Fax:  [phone]  Name: COREE RIESTER MRN: [id number] Date of Birth:  August 06, 1945

## 2019-05-21 ENCOUNTER — Encounter: Payer: Self-pay | Admitting: Occupational Therapy

## 2019-05-21 ENCOUNTER — Other Ambulatory Visit: Payer: Self-pay

## 2019-05-21 ENCOUNTER — Ambulatory Visit: Payer: Medicare Other | Admitting: Physical Therapy

## 2019-05-21 ENCOUNTER — Ambulatory Visit: Payer: Medicare Other | Admitting: Occupational Therapy

## 2019-05-21 ENCOUNTER — Encounter: Payer: Self-pay | Admitting: Family Medicine

## 2019-05-21 ENCOUNTER — Encounter: Payer: Self-pay | Admitting: Physical Therapy

## 2019-05-21 DIAGNOSIS — G8929 Other chronic pain: Secondary | ICD-10-CM | POA: Diagnosis not present

## 2019-05-21 DIAGNOSIS — I69354 Hemiplegia and hemiparesis following cerebral infarction affecting left non-dominant side: Secondary | ICD-10-CM | POA: Diagnosis not present

## 2019-05-21 DIAGNOSIS — M25512 Pain in left shoulder: Secondary | ICD-10-CM | POA: Diagnosis not present

## 2019-05-21 DIAGNOSIS — R2681 Unsteadiness on feet: Secondary | ICD-10-CM

## 2019-05-21 DIAGNOSIS — R278 Other lack of coordination: Secondary | ICD-10-CM

## 2019-05-21 DIAGNOSIS — M25612 Stiffness of left shoulder, not elsewhere classified: Secondary | ICD-10-CM

## 2019-05-21 DIAGNOSIS — R2689 Other abnormalities of gait and mobility: Secondary | ICD-10-CM

## 2019-05-21 DIAGNOSIS — M6281 Muscle weakness (generalized): Secondary | ICD-10-CM | POA: Diagnosis not present

## 2019-05-21 NOTE — Therapy (Signed)
[facility] [address] [place], [place], [address] Phone: [phone]   Fax:  [phone]  Occupational Therapy Treatment  Patient Details  Name: [name] MRN: [id number] Date of Birth: [birth year] Referring Provider (OT): Dr. Letta Pate   Encounter Date: 05/21/2019  OT End of Session - 05/21/19 2216    Visit Number  34    Number of Visits  38    Date for OT Re-Evaluation  06/07/19    Authorization Type  [employer] MCR    Authorization Time Period  week 2/4 (from last renewal)    Authorization - Visit Number  34    Authorization - Number of Visits  40    OT Start Time  0725    OT Stop Time  0803    OT Time Calculation (min)  38 min    Activity Tolerance  Patient tolerated treatment well    Behavior During Therapy  [facility] for tasks assessed/performed       Past Medical History:  Diagnosis Date  . Allergy   . Arthritis   . Cataract   . Diabetes mellitus without complication ([place])   . Hypertension   . Macular degeneration syndrome    with edema---being treated.     Past Surgical History:  Procedure Laterality Date  . callous removal  Left 2017   located on 1st digit on L foot 2/2 diabetes  . EYE SURGERY      There were no vitals filed for this visit.  Subjective Assessment - 05/21/19 2210    Subjective   Pt/dtr reports that pt does not want to get Botox again and wonders if she will need it    Patient is accompanied by:  Family member   daughter   Pertinent History  CVA 02/27/18, pelvic fx and Lt hip  fx 09/2018. PMH: HTN, OA    Limitations  Fall risk, wounds on feet    Currently in Pain?  No/denies          Sitting, table slides for shoulder flex and circumduction with min v.c. and use of BUEs for incr midline alignment.  Sitting, removing cylinder pegs from pegboard with improved movement, but reports fatigue/shoulder pain with repetition likely due to compensation patterns.  Standing to place  pegs in pegboard for low-range reach and coordination with min-mod difficulty for improved standing tolerance and LUE control, min-mod difficulty, min cueing for midline alignment.    Sitting, folding washcloths with min cueing/prompts to incr use of LUE for bilateral task with RUE assist for placement in L hand.  Zipping jacket in on table with mod difficulty, min cueing for orientation.  Unfastening large buttons with min-mod difficulty/icr time. For bilateral UE use.  Continued to encourage pt to incr LUE forced use (modified constraint-induced therapy with oven mitt).  Bringing empty cup to mouth with min cueing/difficulty with control initially, then improved with repetition.  Functional mid range reach to grasp/release cup with improved LUE ROM noted.         OT Education - 05/21/19 2212    Education Details  Reviewed importance of stretching and forced LUE functional use while Botox is effective to decr risk of needing further Botox (as pt does not wish to get Botox again) and to maximize benefit and LUE functional use    Person(s) Educated  Patient;Child(ren)    Methods  Explanation    Comprehension  Verbalized understanding       OT Short Term Goals -  04/20/19 1414      OT SHORT TERM GOAL #1   Title  Independent with initial HEP for shoulder ROM, and functional reaching LUE    Time  4    Period  Weeks    Status  Achieved      OT SHORT TERM GOAL #2   Title  Pt to perform UB dressing with set up only and LE dressing with mod assist or less using A/E prn    Time  4    Period  Weeks    Status  Achieved      OT SHORT TERM GOAL #3   Title  Pt to increase LUE functional use as evidenced by performing 15 blocks on Box & Blocks test.--STGs due 03/21/19   (modified 03/19/19)    Baseline  11 blocks    Time  4    Period  Weeks    Status  Achieved   01/27/19:  11 blocks, 02/17/19:  13, 03/19/19 10 blocks, 04/20/19 15 blocks     OT SHORT TERM GOAL #4   Title  Pt to perform  toileting/clothes management with only min assist    Period  Weeks    Status  Achieved   Able to complete - simulation -  in clinic.  01/27/19:  met per pt/dtr report     OT SHORT TERM GOAL #5   Title  Pt to achieve 90* shoulder flexion LUE for mid level reaching    Time  4    Period  Weeks    Status  Not Met   01/18/19:  75* with min compensation.  02/17/19:  85* with min compensation.  03/09/19: inconsistent. 03/25/19: 85* w/ compensations       OT Long Term Goals - 04/20/19 1415      OT LONG TERM GOAL #1   Title  Independent with updated HEP - due 04/20/19    Time  8    Period  Weeks    Status  Deferred      OT LONG TERM GOAL #2   Title  Pt to be mod I with all BADLS using A/E as needed (except shower transfers to still be supervision)    Time  8    Period  Weeks    Status  Partially Met   02/17/19:  assist for compression stockings, min A for depends, supervision for shower transfer (mod I for bathing), toileting with BSC mod I     OT LONG TERM GOAL #3   Title  Pt to improve LUE function as evidenced by performing 18 blocks or greater on Box & Blocks test--revised 03/19/19    Time  4    Period  Weeks    Status  Revised   02/17/19: 13, 04/20/19: 15     OT LONG TERM GOAL #4   Title  Pt to improve coordination as evidenced by performing 9 hole peg test completely in 2 min. 30 sec or less    Baseline  placed 5 in 2 min.    Time  4    Period  Weeks    Status  Revised   02/17/19:  5 pegs within 64mn.  completed in 448m 43sec. 04/20/19: completed in 3 min. 28 sec.     OT LONG TERM GOAL #5   Title  Grip strength Lt hand to be 10 lbs or greater    Baseline  5 lbs    Time  4    Period  Weeks  Status  Revised   02/17/19:  5lbs, 04/20/19: 5 lbs     OT LONG TERM GOAL #6   Title  Pt to perform simple snack, cold food, and microwaveable food mod I level with DME prn without LOB    Time  4    Period  Weeks    Status  On-going   02/17/19:  not performing. 03/25/19: performed in  clinic making sandwich w/ close sup (partly from w/c level, mostly from standing)     OT LONG TERM GOAL #7   Title  Pt to achieve 110* or greater P/ROM Lt shoulder with pain less than or equal to 5/10    Time  8    Period  Weeks    Status  Achieved   02/17/19:  110-115* in sitting with 4-5/10 pain           Plan - 05/21/19 2222    Clinical Impression Statement  Pt with increased standing tolerance/endurance.  Pt fatigues quickly with LUE functional use, but demo improved ability to use LUE with low range tasks.    Occupational performance deficits (Please refer to evaluation for details):  ADL's;IADL's;Leisure    Body Structure / Function / Physical Skills  ADL;ROM;Balance;IADL;Body mechanics;Improper spinal/pelvic alignment;Mobility;Strength;Flexibility;FMC;Coordination;UE functional use;Proprioception;Decreased knowledge of use of DME    Rehab Potential  Good    Comorbidities impacting occupational performance description:  Wounds bilateral feet, Healed recent hip and pelvic fx's    OT Frequency  2x / week    OT Duration  4 weeks    OT Treatment/Interventions  Self-care/ADL training;Therapeutic exercise;Functional Mobility Training;Neuromuscular education;Aquatic Therapy;Manual Therapy;Splinting;Therapeutic activities;Paraffin;DME and/or AE instruction;Cognitive remediation/compensation;Electrical Stimulation;Visual/perceptual remediation/compensation;Moist Heat;Passive range of motion;Patient/family education    Plan  supine: shoulder stretching, followed by functional use LUE in low ranges    Consulted and Agree with Plan of Care  Patient;Family member/caregiver    Family Member Consulted  daughter       Patient will benefit from skilled therapeutic intervention in order to improve the following deficits and impairments:   Body Structure / Function / Physical Skills: ADL, ROM, Balance, IADL, Body mechanics, Improper spinal/pelvic alignment, Mobility, Strength, Flexibility, FMC,  Coordination, UE functional use, Proprioception, Decreased knowledge of use of DME       Visit Diagnosis: Hemiplegia and hemiparesis following cerebral infarction affecting left non-dominant side (HCC)  Unsteadiness on feet  Other lack of coordination  Other abnormalities of gait and mobility  Muscle weakness (generalized)  Stiffness of left shoulder, not elsewhere classified  Chronic left shoulder pain    Problem List Patient Active Problem List   Diagnosis Date Noted  . Diverticulitis of large intestine with perforation without abscess or bleeding 04/16/2019  . Iron deficiency anemia 02/03/2019  . Pelvic fracture ([place]) 09/21/2018  . Allergic reaction caused by a drug 05/06/2018  . Bilateral leg edema 04/13/2018  . HLD (hyperlipidemia) 03/23/2018  . Mood swings 03/23/2018  . Chronic kidney disease (CKD), stage III (moderate)   . Embolic stroke ([place]) 64/40/3474  . Left hemiparesis ([place])   . Diabetes mellitus type 2 in nonobese (HCC)   . Acute ischemic stroke ([place])   . Left arm weakness   . Acute CVA (cerebrovascular accident) ([place]) 02/24/2018  . Overweight (BMI 25.0-29.9) 02/24/2018  . CKD (chronic kidney disease), stage III 02/24/2018  . Diabetes mellitus without complication ([place]) 25/95/6387  . HTN (hypertension) 09/02/2017    [facility] 05/21/2019, 10:26 PM  [facility] [address] [place],  [place], [address] Phone: [phone]   Fax:  [phone]  Name: MARINELL IGARASHI MRN: [id number] Date of Birth: 04/12/1946   Vianne Bulls, OTR/L [facility] [address] [place], [place]  [address] [phone] phone [phone] 05/21/19 10:27 PM

## 2019-05-21 NOTE — Therapy (Signed)
[facility] [address] [place], [place], [address] Phone: [phone]   Fax:  [phone]  Physical Therapy Treatment/ 10th Visit Progress Note  Patient Details  Name: [name] MRN: [id number] Date of Birth: 1945/10/17 Referring Provider (PT): Alysia Penna, MD  Progress Note Reporting Period 04/16/19 to 05/21/19   See note below for Objective Data and Assessment of Progress/Goals.     Encounter Date: 05/21/2019  PT End of Session - 05/21/19 1020    Visit Number  50    Number of Visits  33    Date for PT Re-Evaluation  06/16/19    Authorization Type  [employer] Medicare, 10th visit progress note    PT Start Time  0804    PT Stop Time  0845    PT Time Calculation (min)  41 min    Activity Tolerance  Patient tolerated treatment well    Behavior During Therapy  W[facility] for tasks assessed/performed       Past Medical History:  Diagnosis Date  . Allergy   . Arthritis   . Cataract   . Diabetes mellitus without complication ([place])   . Hypertension   . Macular degeneration syndrome    with edema---being treated.     Past Surgical History:  Procedure Laterality Date  . callous removal  Left 2017   located on 1st digit on L foot 2/2 diabetes  . EYE SURGERY      There were no vitals filed for this visit.  Subjective Assessment - 05/21/19 0817    Subjective  Nothing new to report. No changes since yesterday.    Pertinent History  Patient had a R CVA 02/2018 and was walking with a SPC prior to a fall in March 2020 resulting in being hospitalized with multiple pelvic fxs and a L hip fx. During hospital stay, patient acquired pressure ulcers on bilateral soles of feet near heels.Patient is now safely able to bear weight and pressure ulcers are now a stage 1 (per daughter report and photos).    Limitations  Standing;Walking;House hold activities    Patient Stated Goals  "Be back to normal, get back to walking, and  getting strength back"    Currently in Pain?  No/denies                       [facility] Adult PT Treatment/Exercise - 05/21/19 1109      Transfers   Transfers  Sit to Stand;Stand to Sit    Sit to Stand  5: Supervision;4: Min guard    Sit to Stand Details  Verbal cues for sequencing;Verbal cues for technique    Sit to Stand Details (indicate cue type and reason)  1 x 5 reps from higher blue mat table, cues for anterior weight shift and not using UE - cues to stand tall before sitting down with no UE support    Stand to Sit  5: Supervision      Ambulation/Gait   Ambulation/Gait  Yes    Ambulation/Gait Assistance  4: Min guard;5: Supervision    Ambulation/Gait Assistance Details  Cues for upright posture and for step length, ambulated from table with OT back to PT treatment table - 50' (needed rest break halfway through due to fatigue). Able to ambulate to and from NuStep in session    Ambulation Distance (Feet)  100 Feet   1 x 50' 2 x 25'    Assistive device  Rolling walker  Gait Pattern  Step-through pattern;Trunk flexed;Decreased weight shift to left;Poor foot clearance - left;Step-to pattern    Ambulation Surface  Level;Indoor      Exercises   Other Exercises   NuStep with BLE and BUE for strengthening,endurance, and ROM x8 minutes, starting at gear 2 and progressing to gear 4. - L hand gripper used                PT Short Term Goals - 05/13/19 8127      PT SHORT TERM GOAL #1   Title  Pt will demonstrate ability to perform ongoing HEP (stretching and standing exercises) with husband's supervision    Status  Achieved    Target Date  05/17/19      PT SHORT TERM GOAL #2   Title  Patient will increase BERG score to at least a 40/56 in order to help decrease risk of future falls.    Baseline  37/56 > declined to 19/56    Status  Not Met    Target Date  05/17/19      PT SHORT TERM GOAL #3   Title  Pt will consistently ambulate with RW to/from treatment area  with supervision    Baseline  has been doing consistently except this past week when diverticulitis flared    Time  4    Period  Weeks    Status  Achieved    Target Date  05/17/19      PT SHORT TERM GOAL #4   Title  Pt will improve gait velocity with LRAD to >/= 1.0 ft/sec    Baseline  .83 ft/sec with RW > declined to .21 ft/sec    Time  4    Period  Weeks    Status  Not Met    Target Date  05/17/19      PT SHORT TERM GOAL #5   Title  Patient will negotiate 4 stairs with 2 rails, step to sequence with supervision    Baseline  self reporting - doing each day with daughter's supervision    Time  4    Period  Weeks    Status  Achieved    Target Date  05/17/19        PT Long Term Goals - 04/16/19 0807      PT LONG TERM GOAL #1   Title  Patient will report performing HEP atleast 5 days a week with husband's supervision    Baseline  04/13/19: met with current program    Status  Revised    Target Date  06/16/19      PT LONG TERM GOAL #2   Title  Pt will ambulate 230' with LRAD over indoor surfaces with supervision and will report ambulating to/from the bathroom with RW and supervision at home    Baseline  04/16/19: max distance of 115 feet before needed a rest break with supervision to min guard assist with RW    Time  8    Period  Weeks    Status  Revised    Target Date  06/16/19      PT LONG TERM GOAL #3   Title  Patient will increase BERG score to at least 44/56 in order to decrease risk of future falls.    Baseline  --    Time  8    Period  Weeks    Status  Revised    Target Date  06/16/19      PT LONG TERM GOAL #  4   Title  Pt will perform sit <> stand and stand pivots with LRAD MOD I    Baseline  04/13/19: supervision for sit<>stand and stand pivot tranfers on consistent basis, improved from occasional need of min assist    Time  8    Period  Weeks    Status  Revised    Target Date  06/16/19      PT LONG TERM GOAL #5   Title  Patient will increase gait speed to  at least 1.8t/sec with LRAD in order to decrease risk of falls and improve household ambulation.    Baseline  --    Time  8    Period  Weeks    Status  Revised    Target Date  06/16/19      PT LONG TERM GOAL #6   Title  Pt will ambulate from car <> clinic waiting area (with car pulled up to entrance) with RW and min guard leaving w/c completely in car    Baseline  --    Time  8    Period  Weeks    Status  New    Target Date  06/16/19            Plan - 05/21/19 1115    Clinical Impression Statement  10th visit progress note: STGs were checked last week on 05/17/19, pt showing a decline in progress due to recent flare of diverticulitis causing pt to be more sedentary at home. Pt's BERG score declined to a 19/56 (previously 37/56) and gait speed declined to .21 ft/sec (previously .83 ft/sec).Educated on importance of performing standing exercises and walking with supervision with RW at home to prevent further decline. Today's skilled session focused on gait training, LE strengthening, and endurance. Pt needing 1 seated rest break today halfway through 50' bout of gait. Pt with better tolerance for today's session. Will continue to progress towards LTGs.    PT Frequency  2x / week    PT Duration  8 weeks    PT Treatment/Interventions  ADLs/Self Care Home Management;Gait training;DME Instruction;Stair training;Functional mobility training;Therapeutic activities;Patient/family education;Neuromuscular re-education;Balance training;Therapeutic exercise;Passive range of motion;Orthotic Fit/Training    PT Next Visit Plan  WALKING TO/FROM TREATMENT AREA FROM WAITING AREA -PROGRESS AWAY FROM W/C. Slow progress pt back to baseline with standing exercises and ambulation as pt is recovering from diverticulitis; Nustep for endurance; standing balance with eyes closed/head turns, hip strategy, step ups to L lateral and forward step ups for weight shifting. .    PT Home Exercise Plan  [id number]    Consulted  and Agree with Plan of Care  Patient;Family member/caregiver    Family Member Consulted  daughter, [name]       Patient will benefit from skilled therapeutic intervention in order to improve the following deficits and impairments:  Abnormal gait, Decreased activity tolerance, Decreased balance, Decreased range of motion, Decreased mobility, Decreased endurance, Decreased skin integrity, Decreased strength, Difficulty walking, Impaired perceived functional ability, Postural dysfunction  Visit Diagnosis: Unsteadiness on feet  Other lack of coordination  Other abnormalities of gait and mobility  Hemiplegia and hemiparesis following cerebral infarction affecting left non-dominant side [facility])     Problem List Patient Active Problem List   Diagnosis Date Noted  . Diverticulitis of large intestine with perforation without abscess or bleeding 04/16/2019  . Iron deficiency anemia 02/03/2019  . Pelvic fracture ([place]) 09/21/2018  . Allergic reaction caused by a drug 05/06/2018  . Bilateral leg edema 04/13/2018  .  HLD (hyperlipidemia) 03/23/2018  . Mood swings 03/23/2018  . Chronic kidney disease (CKD), stage III (moderate)   . Embolic stroke ([place]) 85/50/1586  . Left hemiparesis ([place])   . Diabetes mellitus type 2 in nonobese (HCC)   . Acute ischemic stroke ([place])   . Left arm weakness   . Acute CVA (cerebrovascular accident) ([place]) 02/24/2018  . Overweight (BMI 25.0-29.9) 02/24/2018  . CKD (chronic kidney disease), stage III 02/24/2018  . Diabetes mellitus without complication ([place]) 82/57/4935  . HTN (hypertension) 09/02/2017    Arliss Journey, PT, DPT  05/21/2019, 11:18 AM  [facility] [address] [place], [place], [address] Phone: [phone]   Fax:  [phone]  Name: BRADI ARBUTHNOT MRN: [id number] Date of Birth: 21-Jun-1946

## 2019-05-22 ENCOUNTER — Telehealth: Payer: Medicare Other | Admitting: Nurse Practitioner

## 2019-05-22 DIAGNOSIS — J00 Acute nasopharyngitis [common cold]: Secondary | ICD-10-CM | POA: Diagnosis not present

## 2019-05-22 MED ORDER — FLUTICASONE PROPIONATE 50 MCG/ACT NA SUSP: 2.0000 | Freq: Every day | NASAL | 6 refills | Status: DC

## 2019-05-22 NOTE — Progress Notes (Signed)
We are sorry you are not feeling well.  Here is how we plan to help!  Based on what you have shared with me, it looks like you may have a viral upper respiratory infection.  Upper respiratory infections are caused by a large number of viruses; however, rhinovirus is the most common cause.   Symptoms vary from person to person, with common symptoms including sore throat, cough, fatigue or lack of energy and feeling of general discomfort.  A low-grade fever of up to 100.4 may present, but is often uncommon.  Symptoms vary however, and are closely related to a person's age or underlying illnesses.  The most common symptoms associated with an upper respiratory infection are nasal discharge or congestion, cough, sneezing, headache and pressure in the ears and face.  These symptoms usually persist for about 3 to 10 days, but can last up to 2 weeks.  It is important to know that upper respiratory infections do not cause serious illness or complications in most cases.    Upper respiratory infections can be transmitted from person to person, with the most common method of transmission being a person's hands.  The virus is able to live on the skin and can infect other persons for up to 2 hours after direct contact.  Also, these can be transmitted when someone coughs or sneezes; thus, it is important to cover the mouth to reduce this risk.  To keep the spread of the illness at [place], good hand hygiene is very important.  This is an infection that is most likely caused by a virus. There are no specific treatments other than to help you with the symptoms until the infection runs its course.  We are sorry you are not feeling well.  Here is how we plan to help!   For nasal congestion, you may use an oral decongestants such as Mucinex D or if you have glaucoma or high blood pressure use plain Mucinex.  Saline nasal spray or nasal drops can help and can safely be used as often as needed for congestion.  For your congestion,  I have prescribed Fluticasone nasal spray one spray in each nostril twice a day  Providers prescribe antibiotics to treat infections caused by bacteria. Antibiotics are very powerful in treating bacterial infections when they are used properly. To maintain their effectiveness, they should be used only when necessary. Overuse of antibiotics has resulted in the development of superbugs that are resistant to treatment!    After careful review of your answers, I would not recommend an antibiotic for your condition.  Antibiotics are not effective against viruses and therefore should not be used to treat them. Common examples of infections caused by viruses include colds and flu. We also do not give antiniotice to prevent sinus infection.   If you do not have a history of heart disease, hypertension, diabetes or thyroid disease, prostate/bladder issues or glaucoma, you may also use Sudafed to treat nasal congestion.  It is highly recommended that you consult with a pharmacist or your primary care physician to ensure this medication is safe for you to take.     If you have a cough, you may use cough suppressants such as Delsym and Robitussin.  If you have glaucoma or high blood pressure, you can also use Coricidin HBP.    If you have a sore or scratchy throat, use a saltwater gargle-  to  teaspoon of salt dissolved in a 4-ounce to 8-ounce glass of warm water.  Gargle the solution for approximately 15-30 seconds and then spit.  It is important not to swallow the solution.  You can also use throat lozenges/cough drops and Chloraseptic spray to help with throat pain or discomfort.  Warm or cold liquids can also be helpful in relieving throat pain.  For headache, pain or general discomfort, you can use Ibuprofen or Tylenol as directed.   Some authorities believe that zinc sprays or the use of Echinacea may shorten the course of your symptoms.   HOME CARE . Only take medications as instructed by your medical  team. . Be sure to drink plenty of fluids. Water is fine as well as fruit juices, sodas and electrolyte beverages. You may want to stay away from caffeine or alcohol. If you are nauseated, try taking small sips of liquids. How do you know if you are getting enough fluid? Your urine should be a pale yellow or almost colorless. . Get rest. . Taking a steamy shower or using a humidifier may help nasal congestion and ease sore throat pain. You can place a towel over your head and breathe in the steam from hot water coming from a faucet. . Using a saline nasal spray works much the same way. . Cough drops, hard candies and sore throat lozenges may ease your cough. . Avoid close contacts especially the very young and the elderly . Cover your mouth if you cough or sneeze . Always remember to wash your hands.   GET HELP RIGHT AWAY IF: . You develop worsening fever. . If your symptoms do not improve within 10 days . You develop yellow or green discharge from your nose over 3 days. . You have coughing fits . You develop a severe head ache or visual changes. . You develop shortness of breath, difficulty breathing or start having chest pain . Your symptoms persist after you have completed your treatment plan  MAKE SURE YOU   Understand these instructions.  Will watch your condition.  Will get help right away if you are not doing well or get worse.  Your e-visit answers were reviewed by a board certified advanced clinical practitioner to complete your personal care plan. Depending upon the condition, your plan could have included both over the counter or prescription medications. Please review your pharmacy choice. If there is a problem, you may call our nursing hot line at and have the prescription routed to another pharmacy. Your safety is important to [place]. If you have drug allergies check your prescription carefully.   You can use MyChart to ask questions about today's visit, request a non-urgent  call back, or ask for a work or school excuse for 24 hours related to this e-Visit. If it has been greater than 24 hours you will need to follow up with your provider, or enter a new e-Visit to address those concerns. You will get an e-mail in the next two days asking about your experience.  I hope that your e-visit has been valuable and will speed your recovery. Thank you for using e-visits.    5-10 minutes spent reviewing and documenting in chart.

## 2019-05-25 ENCOUNTER — Ambulatory Visit: Payer: Medicare Other | Admitting: Occupational Therapy

## 2019-05-25 ENCOUNTER — Ambulatory Visit: Payer: Medicare Other | Admitting: Physical Therapy

## 2019-05-25 ENCOUNTER — Ambulatory Visit: Payer: Medicare Other | Admitting: Physical Medicine & Rehabilitation

## 2019-05-27 ENCOUNTER — Encounter: Payer: Self-pay | Admitting: Physical Therapy

## 2019-05-27 ENCOUNTER — Other Ambulatory Visit: Payer: Self-pay

## 2019-05-27 ENCOUNTER — Ambulatory Visit: Payer: Medicare Other | Admitting: Physical Therapy

## 2019-05-27 DIAGNOSIS — M25612 Stiffness of left shoulder, not elsewhere classified: Secondary | ICD-10-CM | POA: Diagnosis not present

## 2019-05-27 DIAGNOSIS — M25512 Pain in left shoulder: Secondary | ICD-10-CM | POA: Diagnosis not present

## 2019-05-27 DIAGNOSIS — R2689 Other abnormalities of gait and mobility: Secondary | ICD-10-CM | POA: Diagnosis not present

## 2019-05-27 DIAGNOSIS — R278 Other lack of coordination: Secondary | ICD-10-CM | POA: Diagnosis not present

## 2019-05-27 DIAGNOSIS — R2681 Unsteadiness on feet: Secondary | ICD-10-CM

## 2019-05-27 DIAGNOSIS — I69354 Hemiplegia and hemiparesis following cerebral infarction affecting left non-dominant side: Secondary | ICD-10-CM | POA: Diagnosis not present

## 2019-05-27 DIAGNOSIS — M6281 Muscle weakness (generalized): Secondary | ICD-10-CM

## 2019-05-27 DIAGNOSIS — G8929 Other chronic pain: Secondary | ICD-10-CM | POA: Diagnosis not present

## 2019-05-28 ENCOUNTER — Encounter: Payer: Self-pay | Admitting: Physical Therapy

## 2019-05-28 ENCOUNTER — Ambulatory Visit: Payer: Medicare Other | Admitting: Occupational Therapy

## 2019-05-28 ENCOUNTER — Ambulatory Visit: Payer: Medicare Other | Admitting: Physical Therapy

## 2019-05-28 DIAGNOSIS — I69354 Hemiplegia and hemiparesis following cerebral infarction affecting left non-dominant side: Secondary | ICD-10-CM

## 2019-05-28 DIAGNOSIS — M25612 Stiffness of left shoulder, not elsewhere classified: Secondary | ICD-10-CM | POA: Diagnosis not present

## 2019-05-28 DIAGNOSIS — R2689 Other abnormalities of gait and mobility: Secondary | ICD-10-CM

## 2019-05-28 DIAGNOSIS — M6281 Muscle weakness (generalized): Secondary | ICD-10-CM | POA: Diagnosis not present

## 2019-05-28 DIAGNOSIS — M25512 Pain in left shoulder: Secondary | ICD-10-CM | POA: Diagnosis not present

## 2019-05-28 DIAGNOSIS — R278 Other lack of coordination: Secondary | ICD-10-CM | POA: Diagnosis not present

## 2019-05-28 DIAGNOSIS — R2681 Unsteadiness on feet: Secondary | ICD-10-CM | POA: Diagnosis not present

## 2019-05-28 DIAGNOSIS — G8929 Other chronic pain: Secondary | ICD-10-CM | POA: Diagnosis not present

## 2019-05-28 NOTE — Therapy (Signed)
[facility] [address] [place], [place], [address] Phone: [phone]   Fax:  [phone]  Physical Therapy Treatment  Patient Details  Name: [name] MRN: [id number] Date of Birth: [birth year] Referring Provider (PT): Alysia Penna, MD   Encounter Date: 05/27/2019  PT End of Session - 05/28/19 0427    Visit Number  51    Number of Visits  30    Date for PT Re-Evaluation  06/16/19    Authorization Type  [employer] Medicare, 10th visit progress note    PT Start Time  0805    PT Stop Time  0846    PT Time Calculation (min)  41 min    Activity Tolerance  Patient tolerated treatment well    Behavior During Therapy  [facility] for tasks assessed/performed       Past Medical History:  Diagnosis Date  . Allergy   . Arthritis   . Cataract   . Diabetes mellitus without complication ([place])   . Hypertension   . Macular degeneration syndrome    with edema---being treated.     Past Surgical History:  Procedure Laterality Date  . callous removal  Left 2017   located on 1st digit on L foot 2/2 diabetes  . EYE SURGERY      There were no vitals filed for this visit.  Subjective Assessment - 05/27/19 0813    Subjective  No changes, no falls, no stumbles.    Pertinent History  Patient had a R CVA 02/2018 and was walking with a SPC prior to a fall in March 2020 resulting in being hospitalized with multiple pelvic fxs and a L hip fx. During hospital stay, patient acquired pressure ulcers on bilateral soles of feet near heels.Patient is now safely able to bear weight and pressure ulcers are now a stage 1 (per daughter report and photos).    Limitations  Standing;Walking;House hold activities    Patient Stated Goals  "Be back to normal, get back to walking, and getting strength back"    Currently in Pain?  No/denies                       [facility] Adult PT Treatment/Exercise - 05/28/19 0001      Transfers   Transfers  Sit to Stand;Stand to Sit    Sit to Stand  5: Supervision;4: Min guard    Sit to Stand Details  Verbal cues for sequencing;Verbal cues for technique    Sit to Stand Details (indicate cue type and reason)  5 reps from mat table, cues for forward lean, cues for upright posture upon standing,including quad and glut activation before sitting; additional 4 reps sit<>stand throughout session from transport chair.    Stand to Sit  5: Supervision;With upper extremity assist;To chair/3-in-1      Ambulation/Gait   Ambulation/Gait  Yes    Ambulation/Gait Assistance  4: Min guard;5: Supervision    Ambulation/Gait Assistance Details  Cues for upright posture with gait.  Specifically, provided cues to look about 10 ft ahead on the floor (to encourage pt not to look down at her feet, but not to feel like she's off balance looking up to eye level).  Cues for short distances to count steps for more continuous gait pattern.    Ambulation Distance (Feet)  100 Feet   25, 50   Assistive device  Rolling walker    Gait Pattern  Step-through pattern;Trunk flexed;Decreased weight shift to left;Poor  foot clearance - left;Step-to pattern    Ambulation Surface  Level;Indoor    Gait Comments  Pt responds well to counting steps in short distances, for several bouts of continuous step through gait pattern.        Lumbar Exercises: Seated   Other Seated Lumbar Exercises  Seated anterior/posterior pelvic tilts x 10 reps.  Seated lateral weigthshifting/pelvic tilts x 5 reps each side.  Forward lean with hands on knees, then push up into upright posture (needs visual, verbal cues to incr. weigthshift towards L when returning to upright).            Balance Exercises - 05/28/19 0422      Balance Exercises: Standing   Standing Eyes Opened  Wide (BOA);Head turns;5 reps   Head nods, at counter; 2 sets   Other Standing Exercises  At counter:  wide BOS lateral weightshifting x 10 reps, then stagger stance  anterior/posterior weigthshifting x 10 reps, with therapist providing cues through hips to assist with weigthshifting.        PT Education - 05/28/19 0427    Education Details  Updates to HEP-see instructions    Person(s) Educated  Patient;Child(ren)    Methods  Explanation;Demonstration;Verbal cues;Handout    Comprehension  Verbalized understanding;Returned demonstration       PT Short Term Goals - 05/13/19 0812      PT SHORT TERM GOAL #1   Title  Pt will demonstrate ability to perform ongoing HEP (stretching and standing exercises) with husband's supervision    Status  Achieved    Target Date  05/17/19      PT SHORT TERM GOAL #2   Title  Patient will increase BERG score to at least a 40/56 in order to help decrease risk of future falls.    Baseline  37/56 > declined to 19/56    Status  Not Met    Target Date  05/17/19      PT SHORT TERM GOAL #3   Title  Pt will consistently ambulate with RW to/from treatment area with supervision    Baseline  has been doing consistently except this past week when diverticulitis flared    Time  4    Period  Weeks    Status  Achieved    Target Date  05/17/19      PT SHORT TERM GOAL #4   Title  Pt will improve gait velocity with LRAD to >/= 1.0 ft/sec    Baseline  .83 ft/sec with RW > declined to .21 ft/sec    Time  4    Period  Weeks    Status  Not Met    Target Date  05/17/19      PT SHORT TERM GOAL #5   Title  Patient will negotiate 4 stairs with 2 rails, step to sequence with supervision    Baseline  self reporting - doing each day with daughter's supervision    Time  4    Period  Weeks    Status  Achieved    Target Date  05/17/19        PT Long Term Goals - 04/16/19 0807      PT LONG TERM GOAL #1   Title  Patient will report performing HEP atleast 5 days a week with husband's supervision    Baseline  04/13/19: met with current program    Status  Revised    Target Date  06/16/19      PT LONG TERM GOAL #2  Title  Pt  will ambulate 230' with LRAD over indoor surfaces with supervision and will report ambulating to/from the bathroom with RW and supervision at home    Baseline  04/16/19: max distance of 115 feet before needed a rest break with supervision to min guard assist with RW    Time  8    Period  Weeks    Status  Revised    Target Date  06/16/19      PT LONG TERM GOAL #3   Title  Patient will increase BERG score to at least 44/56 in order to decrease risk of future falls.    Baseline  --    Time  8    Period  Weeks    Status  Revised    Target Date  06/16/19      PT LONG TERM GOAL #4   Title  Pt will perform sit <> stand and stand pivots with LRAD MOD I    Baseline  04/13/19: supervision for sit<>stand and stand pivot tranfers on consistent basis, improved from occasional need of min assist    Time  8    Period  Weeks    Status  Revised    Target Date  06/16/19      PT LONG TERM GOAL #5   Title  Patient will increase gait speed to at least 1.8t/sec with LRAD in order to decrease risk of falls and improve household ambulation.    Baseline  --    Time  8    Period  Weeks    Status  Revised    Target Date  06/16/19      PT LONG TERM GOAL #6   Title  Pt will ambulate from car <> clinic waiting area (with car pulled up to entrance) with RW and min guard leaving w/c completely in car    Baseline  --    Time  8    Period  Weeks    Status  New    Target Date  06/16/19            Plan - 05/28/19 0428    Clinical Impression Statement  Addressed sit to stand transfers, sitting/standing posture and weigthshifting, as well as gait training in PT session today.  Pt responds well to cues for looking ahead about 10 ft on the floor (to help with more upright posture) for short bouts, and she responds well to counting steps for more continuous step-through pattern with gait.  She still needs several brief standing rest breaks with gait, as well as seated breaks with standing exercises, but is  motivated to ambulate in and out of therapy session today.    PT Frequency  2x / week    PT Duration  8 weeks    PT Treatment/Interventions  ADLs/Self Care Home Management;Gait training;DME Instruction;Stair training;Functional mobility training;Therapeutic activities;Patient/family education;Neuromuscular re-education;Balance training;Therapeutic exercise;Passive range of motion;Orthotic Fit/Training    PT Next Visit Plan  WALKING TO/FROM TREATMENT AREA FROM WAITING AREA -PROGRESS AWAY FROM W/C. Slow progress pt back to baseline with standing exercises and ambulation as pt is recovering from diverticulitis; Nustep for endurance; standing balance with eyes closed/head turns, hip strategy, step ups to L lateral and forward step ups for weight shifting. .    PT Home Exercise Plan  [id number]    Consulted and Agree with Plan of Care  Patient;Family member/caregiver    Family Member Consulted  daughter, [name]       Patient will  benefit from skilled therapeutic intervention in order to improve the following deficits and impairments:  Abnormal gait, Decreased activity tolerance, Decreased balance, Decreased range of motion, Decreased mobility, Decreased endurance, Decreased skin integrity, Decreased strength, Difficulty walking, Impaired perceived functional ability, Postural dysfunction  Visit Diagnosis: Unsteadiness on feet  Other abnormalities of gait and mobility  Muscle weakness (generalized)     Problem List Patient Active Problem List   Diagnosis Date Noted  . Diverticulitis of large intestine with perforation without abscess or bleeding 04/16/2019  . Iron deficiency anemia 02/03/2019  . Pelvic fracture ([place]) 09/21/2018  . Allergic reaction caused by a drug 05/06/2018  . Bilateral leg edema 04/13/2018  . HLD (hyperlipidemia) 03/23/2018  . Mood swings 03/23/2018  . Chronic kidney disease (CKD), stage III (moderate)   . Embolic stroke ([place]) 40/04/2724  . Left hemiparesis ([place])   .  Diabetes mellitus type 2 in nonobese ([facility])   . Acute ischemic stroke ([place])   . Left arm weakness   . Acute CVA (cerebrovascular accident) ([place]) 02/24/2018  . Overweight (BMI 25.0-29.9) 02/24/2018  . CKD (chronic kidney disease), stage III 02/24/2018  . Diabetes mellitus without complication ([place]) 36/64/4034  . HTN (hypertension) 09/02/2017    Carvel Huskins W. 05/28/2019, 4:33 AM Frazier Butt., PT  [facility] [address] [place], [place], [address] Phone: [phone]   Fax:  [phone]  Name: [name] MRN: [id number] Date of Birth: 05/22/1946

## 2019-05-28 NOTE — Patient Instructions (Signed)
Provided (basic back packet) handout for seated anterior/posterior pelvic tilts, x 5-10 reps, 3 second hold, 1-2 times per day.  Provided written reminder to: -Look ahead on ground/floor about 10 ft to help with upright posture with gait  -Try to count or have daughter count steps in short distances, to focus on larger steps, more continuous step through gait pattern

## 2019-05-28 NOTE — Therapy (Signed)
[facility] [address] [place], [place], [address] Phone: [phone]   Fax:  [phone]  Physical Therapy Treatment  Patient Details  Name: [name] MRN: [id number] Date of Birth: 07-Aug-1945 Referring Provider (PT): Alysia Penna, MD   Encounter Date: 05/28/2019  PT End of Session - 05/28/19 0944    Visit Number  35    Number of Visits  72    Date for PT Re-Evaluation  06/16/19    Authorization Type  [employer] Medicare, 10th visit progress note    PT Start Time  0850    PT Stop Time  0935    PT Time Calculation (min)  45 min    Activity Tolerance  Patient tolerated treatment well    Behavior During Therapy  [facility] for tasks assessed/performed       Past Medical History:  Diagnosis Date  . Allergy   . Arthritis   . Cataract   . Diabetes mellitus without complication ([place])   . Hypertension   . Macular degeneration syndrome    with edema---being treated.     Past Surgical History:  Procedure Laterality Date  . callous removal  Left 2017   located on 1st digit on L foot 2/2 diabetes  . EYE SURGERY      There were no vitals filed for this visit.  Subjective Assessment - 05/28/19 0918    Subjective  Feeling good today, had a good session with Amy.  Has been using the gait technique of looking 79f ahead at home.    Pertinent History  Patient had a R CVA 02/2018 and was walking with a SPC prior to a fall in March 2020 resulting in being hospitalized with multiple pelvic fxs and a L hip fx. During hospital stay, patient acquired pressure ulcers on bilateral soles of feet near heels.Patient is now safely able to bear weight and pressure ulcers are now a stage 1 (per daughter report and photos).    Limitations  Standing;Walking;House hold activities    Patient Stated Goals  "Be back to normal, get back to walking, and getting strength back"                       [facility]  [facility] - 05/28/19 0920      Transfers   Transfers  Sit to Stand;Stand to Sit    Sit to Stand  5: Supervision    Sit to Stand Details (indicate cue type and reason)  from mat and w/c - simulated sitting down and getting up from the car seat to practice for next week    Stand to Sit  5: Supervision    Stand to Sit Details  still requires verbal cues to pivot fully prior to sitting    Stand Pivot Transfers  5: Supervision      Ambulation/Gait   Ambulation/Gait  Yes    Ambulation/Gait Assistance  4: Min guard;5: Supervision    Ambulation/Gait Assistance Details  Cues for upright posture to dec forward trunk lean, to be able to scan environment, and prevent walker from getting out to far in front of pt during gait with RW especially down slope when ambulating outside. PT gave min tactile cueing for truncal rotation and forward UE progression on the L.  Ambulated in PT gym out to parking lot to minmic gait into and out of clinic from passenger side of the car using the w/c as a seated rest break  at the half way point.   minA for verbal/tactile cues   Ambulation Distance (Feet)  130 Feet   60' x2   Assistive device  Rolling walker   w/c follow by daughter   Gait Pattern  Step-through pattern;Trunk flexed;Decreased weight shift to left;Poor foot clearance - left;Step-to pattern    Ambulation Surface  Level;Indoor;Outdoor;Paved;Other (comment)   minimal slope on ramp     Neuro Re-ed    Neuro Re-ed Details   Standing at edge of mat with LUE on UE ranger and PT providing minA for safety and rotational cues. UE ranger utilized to facilitate LUE movement forward and truncal rotation to the R. Pt performed 4x5 of LUE forward reach at various distances. The last set incuded reaching with RUE behind to reach for cones to cue pt to look over shoulder. Pt repeated reaching for cones with RUE forward in diagonal pattern to facilitated trunk/pelvis dissociation to carryover for gait activites. Pt  tolerated standing balance well however reported moderate fatigue. No LOB.             PT Education - 05/28/19 1633    Education Details  gait training outside over pavement; plan to practice walking to/from car next week    Person(s) Educated  Patient;Child(ren)    Methods  Explanation;Demonstration    Comprehension  Verbalized understanding;Returned demonstration;Need further instruction       PT Short Term Goals - 05/13/19 0812      PT SHORT TERM GOAL #1   Title  Pt will demonstrate ability to perform ongoing HEP (stretching and standing exercises) with husband's supervision    Status  Achieved    Target Date  05/17/19      PT SHORT TERM GOAL #2   Title  Patient will increase BERG score to at least a 40/56 in order to help decrease risk of future falls.    Baseline  37/56 > declined to 19/56    Status  Not Met    Target Date  05/17/19      PT SHORT TERM GOAL #3   Title  Pt will consistently ambulate with RW to/from treatment area with supervision    Baseline  has been doing consistently except this past week when diverticulitis flared    Time  4    Period  Weeks    Status  Achieved    Target Date  05/17/19      PT SHORT TERM GOAL #4   Title  Pt will improve gait velocity with LRAD to >/= 1.0 ft/sec    Baseline  .83 ft/sec with RW > declined to .21 ft/sec    Time  4    Period  Weeks    Status  Not Met    Target Date  05/17/19      PT SHORT TERM GOAL #5   Title  Patient will negotiate 4 stairs with 2 rails, step to sequence with supervision    Baseline  self reporting - doing each day with daughter's supervision    Time  4    Period  Weeks    Status  Achieved    Target Date  05/17/19        PT Long Term Goals - 04/16/19 0807      PT LONG TERM GOAL #1   Title  Patient will report performing HEP atleast 5 days a week with husband's supervision    Baseline  04/13/19: met with current program    Status  Revised  Target Date  06/16/19      PT LONG TERM  GOAL #2   Title  Pt will ambulate 108' with LRAD over indoor surfaces with supervision and will report ambulating to/from the bathroom with RW and supervision at home    Baseline  04/16/19: max distance of 115 feet before needed a rest break with supervision to min guard assist with RW    Time  8    Period  Weeks    Status  Revised    Target Date  06/16/19      PT LONG TERM GOAL #3   Title  Patient will increase BERG score to at least 44/56 in order to decrease risk of future falls.    Baseline  --    Time  8    Period  Weeks    Status  Revised    Target Date  06/16/19      PT LONG TERM GOAL #4   Title  Pt will perform sit <> stand and stand pivots with LRAD MOD I    Baseline  04/13/19: supervision for sit<>stand and stand pivot tranfers on consistent basis, improved from occasional need of min assist    Time  8    Period  Weeks    Status  Revised    Target Date  06/16/19      PT LONG TERM GOAL #5   Title  Patient will increase gait speed to at least 1.8t/sec with LRAD in order to decrease risk of falls and improve household ambulation.    Baseline  --    Time  8    Period  Weeks    Status  Revised    Target Date  06/16/19      PT LONG TERM GOAL #6   Title  Pt will ambulate from car <> clinic waiting area (with car pulled up to entrance) with RW and min guard leaving w/c completely in car    Baseline  --    Time  8    Period  Weeks    Status  New    Target Date  06/16/19            Plan - 05/28/19 1041    Clinical Impression Statement  Today's skilled therapy focused on gait indoor/outdoor with RW for practice of getting in and out of the car and ambulating short distances for access to the community. Pt requires minA during ambulating especially outdoors with variable surfaces (i.e. paved, minimal slope) and verbal/tactile cues for upright posture, forward gaze, and L UE forward movement on RW and truncal rotation. She demonstrated good standing balance at edge of mat  with reaching activity for facilitation of LUE use and truncal rotation for carryover with gait. Pt will continue to benefit from skilled therapy services to address deficits and progress towards achieving functional goals.    PT Frequency  2x / week    PT Duration  8 weeks    PT Treatment/Interventions  ADLs/Self Care Home Management;Gait training;DME Instruction;Stair training;Functional mobility training;Therapeutic activities;Patient/family education;Neuromuscular re-education;Balance training;Therapeutic exercise;Passive range of motion;Orthotic Fit/Training    PT Next Visit Plan  WALKING TO/FROM TREATMENT AREA FROM WAITING AREA -PROGRESS AWAY FROM W/C.  Start Checking LTG.  WED I will go meet them out at the car and practice walking in from the car without w/c.  Finalize HEP and D/C on Wed.    PT Home Exercise Plan  [id number]    Consulted and Agree with Plan of  Care  Patient;Family member/caregiver    Family Member Consulted  daughter, [name]       Patient will benefit from skilled therapeutic intervention in order to improve the following deficits and impairments:  Abnormal gait, Decreased activity tolerance, Decreased balance, Decreased range of motion, Decreased mobility, Decreased endurance, Decreased skin integrity, Decreased strength, Difficulty walking, Impaired perceived functional ability, Postural dysfunction  Visit Diagnosis: Unsteadiness on feet  Other abnormalities of gait and mobility  Muscle weakness (generalized)  Hemiplegia and hemiparesis following cerebral infarction affecting left non-dominant side [facility])     Problem List Patient Active Problem List   Diagnosis Date Noted  . Diverticulitis of large intestine with perforation without abscess or bleeding 04/16/2019  . Iron deficiency anemia 02/03/2019  . Pelvic fracture ([place]) 09/21/2018  . Allergic reaction caused by a drug 05/06/2018  . Bilateral leg edema 04/13/2018  . HLD (hyperlipidemia) 03/23/2018  . Mood  swings 03/23/2018  . Chronic kidney disease (CKD), stage III (moderate)   . Embolic stroke ([place]) 47/84/1282  . Left hemiparesis ([place])   . Diabetes mellitus type 2 in nonobese (HCC)   . Acute ischemic stroke ([place])   . Left arm weakness   . Acute CVA (cerebrovascular accident) ([place]) 02/24/2018  . Overweight (BMI 25.0-29.9) 02/24/2018  . CKD (chronic kidney disease), stage III 02/24/2018  . Diabetes mellitus without complication ([place]) 02/17/8870  . HTN (hypertension) 09/02/2017   Juliann Pulse SPT  Juliann Pulse 05/28/2019, 4:46 PM  [facility] [address] [place], [place], [address] Phone: [phone]   Fax:  [phone]  Name: [name] MRN: [id number] Date of Birth: 19-Feb-1946

## 2019-05-31 ENCOUNTER — Ambulatory Visit: Payer: Medicare Other | Admitting: Physical Therapy

## 2019-06-01 ENCOUNTER — Encounter: Payer: Self-pay | Admitting: Physical Therapy

## 2019-06-02 ENCOUNTER — Encounter: Payer: Self-pay | Admitting: Physical Therapy

## 2019-06-02 ENCOUNTER — Ambulatory Visit: Payer: Medicare Other | Admitting: Physical Therapy

## 2019-06-02 ENCOUNTER — Ambulatory Visit: Payer: Medicare Other | Admitting: Occupational Therapy

## 2019-06-02 NOTE — Therapy (Signed)
[facility] [address] [place], [place], [address] Phone: [phone]   Fax:  [phone]  Patient Details  Name: [name] MRN: [id number] Date of Birth: 06-02-1946 Referring Provider:  No ref. provider found  Encounter Date: 06/02/2019  PHYSICAL THERAPY DISCHARGE SUMMARY  Visits from Start of Care: 52  Current functional level related to goals / functional outcomes: Unable to fully assess; pt requested early D/C due to increase in COVID-19 cases and wishing to limit exposure.  Not able to assess LTG.   Remaining deficits: Impaired ROM, strength, impaired posture, impaired balance and gait   Education / Equipment: HEP  Plan: Patient agrees to discharge.  Patient goals were not met. Patient is being discharged due to the patient's request.  ?????     Rico Junker, PT, DPT 06/02/19    10:25 PM    [facility] [address] [place], [place], [address] Phone: [phone]   Fax:  [phone]

## 2019-06-08 ENCOUNTER — Encounter: Payer: Self-pay | Admitting: Family Medicine

## 2019-06-11 ENCOUNTER — Encounter: Payer: Self-pay | Admitting: Family Medicine

## 2019-06-17 ENCOUNTER — Other Ambulatory Visit: Payer: Self-pay | Admitting: Family Medicine

## 2019-06-17 DIAGNOSIS — K5720 Diverticulitis of large intestine with perforation and abscess without bleeding: Secondary | ICD-10-CM

## 2019-06-18 MED ORDER — AMOXICILLIN-POT CLAVULANATE 875-125 MG PO TABS: 1.0000 | ORAL_TABLET | Freq: Two times a day (BID) | ORAL | 0 refills | Status: DC

## 2019-07-29 ENCOUNTER — Encounter: Payer: Self-pay | Admitting: Family Medicine

## 2019-07-30 ENCOUNTER — Encounter: Payer: Self-pay | Admitting: Family Medicine

## 2019-07-30 ENCOUNTER — Other Ambulatory Visit: Payer: Self-pay

## 2019-07-30 DIAGNOSIS — Z1152 Encounter for screening for COVID-19: Secondary | ICD-10-CM

## 2019-07-30 NOTE — Progress Notes (Signed)
Future order for [facility] per TA created/thx dmf

## 2019-08-04 ENCOUNTER — Telehealth: Payer: Self-pay | Admitting: Behavioral Health

## 2019-08-04 DIAGNOSIS — E559 Vitamin D deficiency, unspecified: Secondary | ICD-10-CM

## 2019-08-04 DIAGNOSIS — D509 Iron deficiency anemia, unspecified: Secondary | ICD-10-CM

## 2019-08-04 DIAGNOSIS — N183 Chronic kidney disease, stage 3 unspecified: Secondary | ICD-10-CM

## 2019-08-04 DIAGNOSIS — I10 Essential (primary) hypertension: Secondary | ICD-10-CM

## 2019-08-04 DIAGNOSIS — E119 Type 2 diabetes mellitus without complications: Secondary | ICD-10-CM

## 2019-08-04 NOTE — Telephone Encounter (Signed)
Okay to check labs as requested.

## 2019-08-04 NOTE — Telephone Encounter (Signed)
Spoke to patient's daughter, Hester Mates; she is requesting the following blood work: GFR, Vitamin B & D, A1c, Iron & Hemoglobin on her mother's behalf. Per Ms. Erline Levine, these are labs that her mother typically have drawn every 6 months.   Dr. Deborra Medina - Please advise. Thanks.

## 2019-08-05 NOTE — Telephone Encounter (Signed)
Created future orders per TA/sent message to pt/thx dmf

## 2019-08-05 NOTE — Addendum Note (Signed)
Addended by: Marrion Coy on: 08/05/2019 03:36 PM   Modules accepted: Orders

## 2019-08-10 ENCOUNTER — Encounter: Payer: Self-pay | Admitting: Podiatry

## 2019-08-17 ENCOUNTER — Other Ambulatory Visit: Payer: Self-pay

## 2019-08-17 ENCOUNTER — Ambulatory Visit (INDEPENDENT_AMBULATORY_CARE_PROVIDER_SITE_OTHER): Payer: Medicare Other | Admitting: Podiatry

## 2019-08-17 ENCOUNTER — Other Ambulatory Visit (INDEPENDENT_AMBULATORY_CARE_PROVIDER_SITE_OTHER): Payer: Medicare Other

## 2019-08-17 DIAGNOSIS — E559 Vitamin D deficiency, unspecified: Secondary | ICD-10-CM | POA: Diagnosis not present

## 2019-08-17 DIAGNOSIS — N183 Chronic kidney disease, stage 3 unspecified: Secondary | ICD-10-CM

## 2019-08-17 DIAGNOSIS — I10 Essential (primary) hypertension: Secondary | ICD-10-CM

## 2019-08-17 DIAGNOSIS — D509 Iron deficiency anemia, unspecified: Secondary | ICD-10-CM

## 2019-08-17 DIAGNOSIS — E119 Type 2 diabetes mellitus without complications: Secondary | ICD-10-CM

## 2019-08-17 DIAGNOSIS — B351 Tinea unguium: Secondary | ICD-10-CM | POA: Diagnosis not present

## 2019-08-17 DIAGNOSIS — Z1152 Encounter for screening for COVID-19: Secondary | ICD-10-CM | POA: Diagnosis not present

## 2019-08-17 LAB — COMPREHENSIVE METABOLIC PANEL
ALT: 13 U/L (ref 0–35)
AST: 12 U/L (ref 0–37)
Albumin: 3.8 g/dL (ref 3.5–5.2)
Alkaline Phosphatase: 103 U/L (ref 39–117)
BUN: 26 mg/dL — ABNORMAL HIGH (ref 6–23)
CO2: 24 mEq/L (ref 19–32)
Calcium: 9 mg/dL (ref 8.4–10.5)
Chloride: 106 mEq/L (ref 96–112)
Creatinine, Ser: 1.84 mg/dL — ABNORMAL HIGH (ref 0.40–1.20)
GFR: 26.8 mL/min — ABNORMAL LOW (ref >60.00)
Glucose, Bld: 139 mg/dL — ABNORMAL HIGH (ref 70–99)
Potassium: 4.3 mEq/L (ref 3.5–5.1)
Sodium: 136 mEq/L (ref 135–145)
Total Bilirubin: 0.3 mg/dL (ref 0.2–1.2)
Total Protein: 6.6 g/dL (ref 6.0–8.3)

## 2019-08-17 LAB — HEMOGLOBIN A1C: Hgb A1c MFr Bld: 6.6 % — ABNORMAL HIGH (ref 4.6–6.5)

## 2019-08-17 LAB — VITAMIN B12: Vitamin B-12: >1500 pg/mL — ABNORMAL HIGH (ref 211–911)

## 2019-08-17 LAB — VITAMIN D 25 HYDROXY (VIT D DEFICIENCY, FRACTURES): VITD: 46.49 ng/mL (ref 30.00–100.00)

## 2019-08-17 NOTE — Progress Notes (Signed)
  Subjective:  Patient ID: [name], female    DOB: 03/01/1946,  MRN: [id number]  No chief complaint on file.  74 y.o. female presents with the above complaint. History confirmed with patient. Last A1c 5.8. Last AM BS 106. PCP Marjory Lies last seen in August. Complains of pain to the left great toenail.  Objective:  Physical Exam: warm, good capillary refill, nail exam onychomycosis of the toenails, no trophic changes or ulcerative lesions, normal DP and PT pulses, and normal sensory exam.  Right hallux nail found interdigitally right with pressure to the 2nd/3rd toes without ulceration. Heel ulcers healed bilat. Left Foot: normal exam, no swelling, tenderness, instability; ligaments intact, full range of motion of all ankle/foot joints  Right Foot: normal exam, no swelling, tenderness, instability; ligaments intact, full range of motion of all ankle/foot joints   No images are attached to the encounter.  Assessment:   1. Diabetes mellitus without complication (HCC)   2. Stage 3 chronic kidney disease, unspecified whether stage 3a or 3b CKD   3. Onychomycosis      Plan:  Patient was evaluated and treated and all questions answered.  Onychomycosis and Diabetes -Patient is diabetic with a qualifying condition for at risk foot care.   Procedure: Nail Debridement Rationale: Patient meets criteria for routine foot care due to CKD Type of Debridement: manual, sharp debridement. Instrumentation: Nail nipper, rotary burr. Number of Nails: 10  Return in about 3 months (around 11/14/2019) for Diabetic Foot Care.

## 2019-08-18 ENCOUNTER — Encounter: Payer: Self-pay | Admitting: Family Medicine

## 2019-08-18 LAB — IRON,TIBC AND FERRITIN PANEL
%SAT: 12 % (calc) — ABNORMAL LOW (ref 16–45)
Ferritin: 133 ng/mL (ref 16–288)
Iron: 32 ug/dL — ABNORMAL LOW (ref 45–160)
TIBC: 266 mcg/dL (calc) (ref 250–450)

## 2019-08-18 LAB — SAR COV2 SEROLOGY (COVID19)AB(IGG),IA: SARS CoV2 AB IGG: NEGATIVE

## 2019-09-01 ENCOUNTER — Encounter: Payer: Self-pay | Admitting: Family Medicine

## 2019-09-02 ENCOUNTER — Encounter: Payer: Self-pay | Admitting: Physical Therapy

## 2019-09-02 IMAGING — MR MR MRA HEAD W/O CM
9 of 11 series · 33 of 48 positions shown · non-contrast
Comparison: None.

CLINICAL DATA: 71 y/o F; 2 days of left arm numbness and history of
hypertension.

EXAM:
MRI HEAD WITHOUT CONTRAST
MRA HEAD WITHOUT CONTRAST
TECHNIQUE: Multiplanar, multiecho pulse sequences of the brain and surrounding
structures were obtained without intravenous contrast. Angiographic
images of the head were obtained using MRA technique without
contrast.

[Series 3: DWI · axial · 3.0mm · 1.09mm/px · z∈[-51,+105]mm · 9 of 108 slices shown (1 of 4)]
[im 1/108]
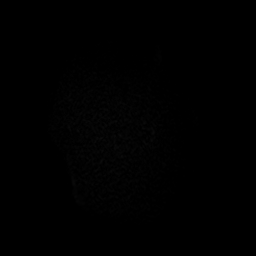
[im 14/108]
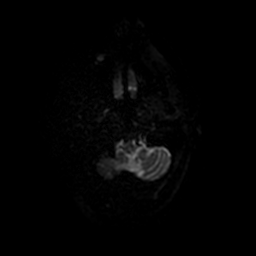
[im 27/108]
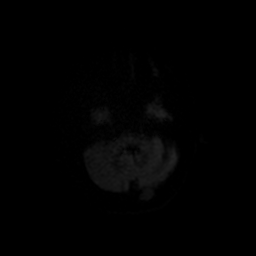
[im 41/108]
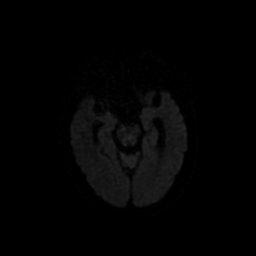
[im 54/108]
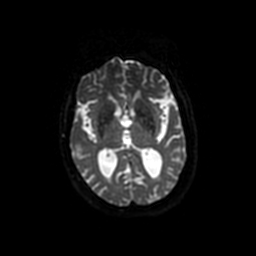
[im 67/108]
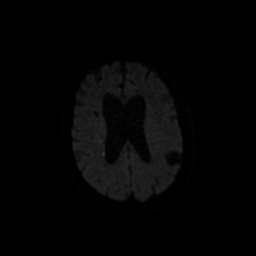
[im 81/108]
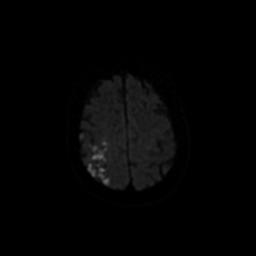
[im 94/108]
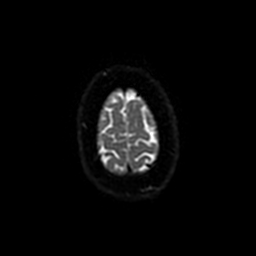
[im 108/108]
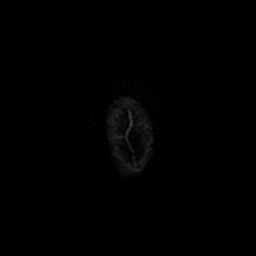

[Series 4: T1 · sagittal · 5.0mm · 0.47mm/px · 2 of 25 slices shown]
[im 1/25]
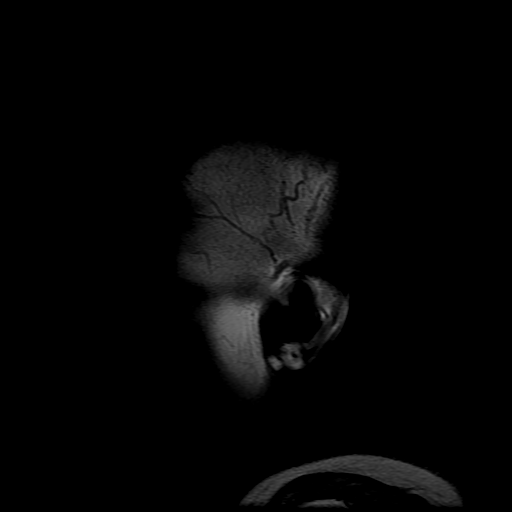
[im 25/25]
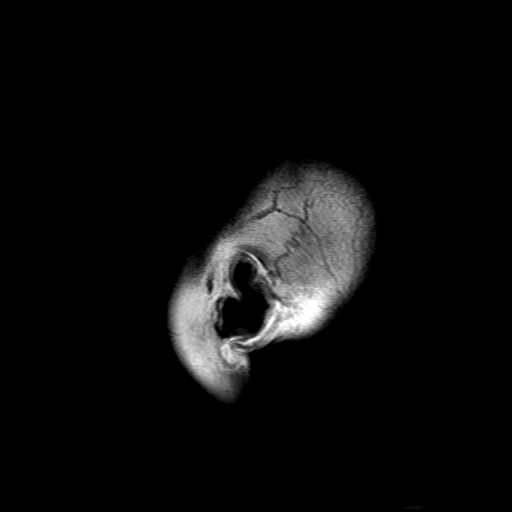

[Series 5: DWI · coronal · 4.0mm · 1.09mm/px · 6 of 72 slices shown (2 of 4)]
[im 1/72]
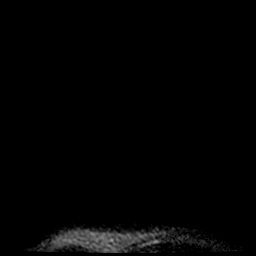
[im 15/72]
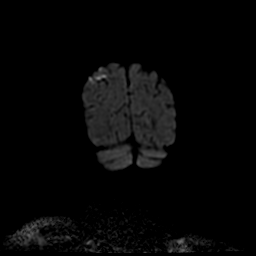
[im 29/72]
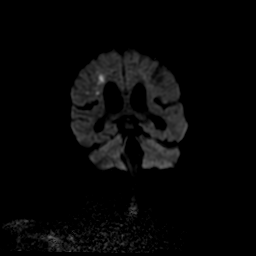
[im 43/72]
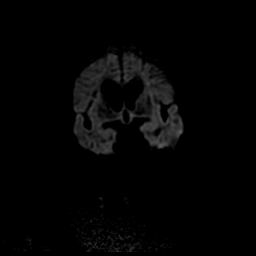
[im 57/72]
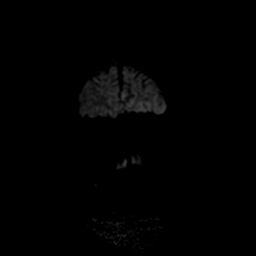
[im 72/72]
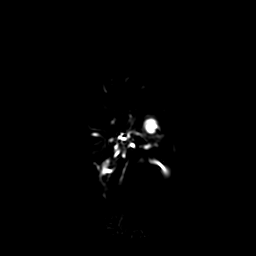

[Series 6: (id) mt fs · axial · 1.4mm · 0.39mm/px · z∈[-34,-4]mm · 3 of 140 slices shown]
[im 1/140]
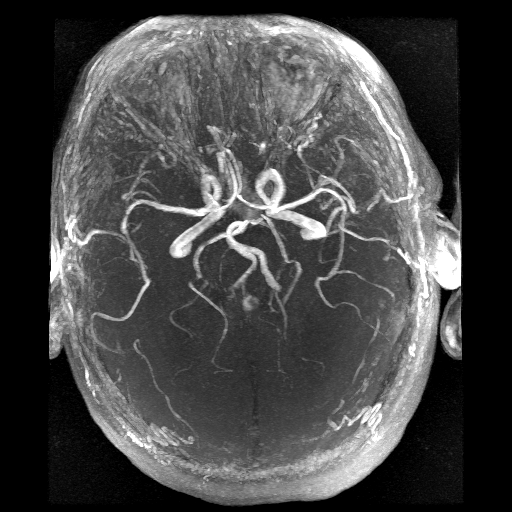
[im 28/140]
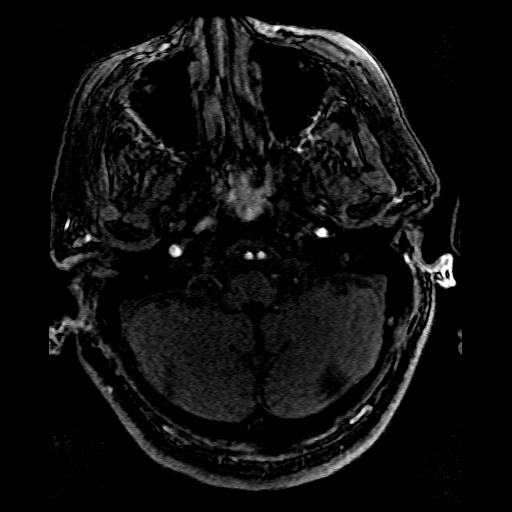
[im 42/140]
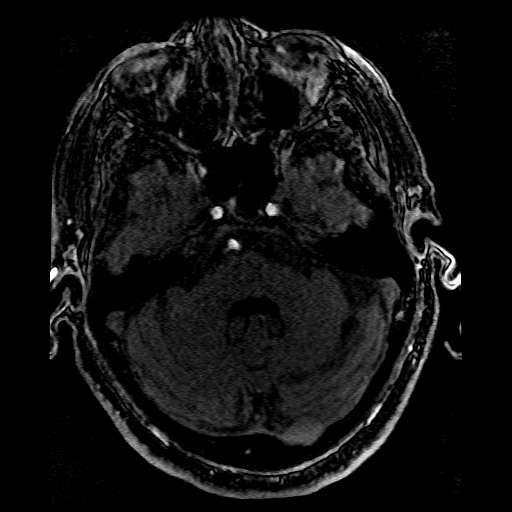

[Series 7: T2 · axial · 5.0mm · 0.43mm/px · z∈[-52,+108]mm · 2 of 28 slices shown (1 of 2)]
[im 1/28]
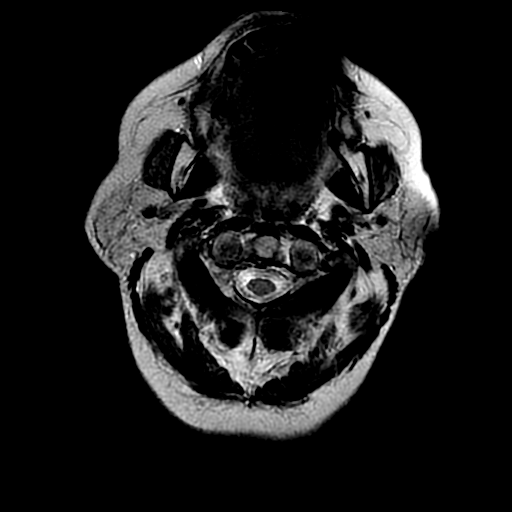
[im 28/28]
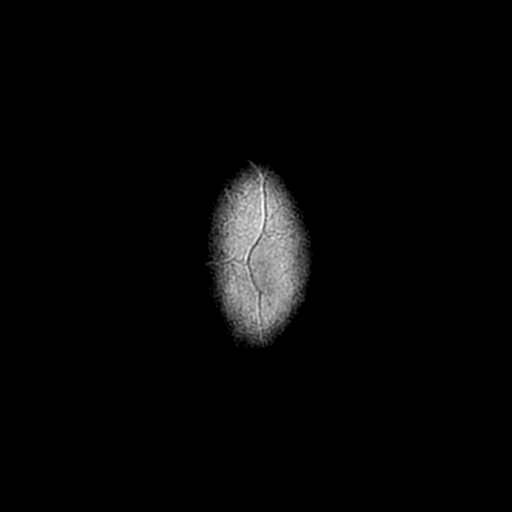

[Series 8: FLAIR · axial · 5.0mm · 0.43mm/px · z∈[-52,+108]mm · 2 of 28 slices shown]
[im 1/28]
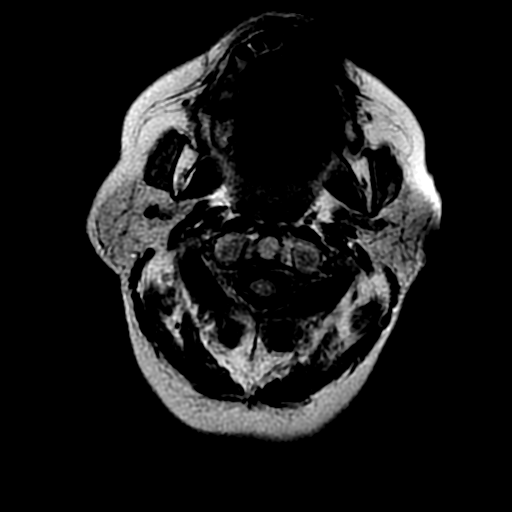
[im 28/28]
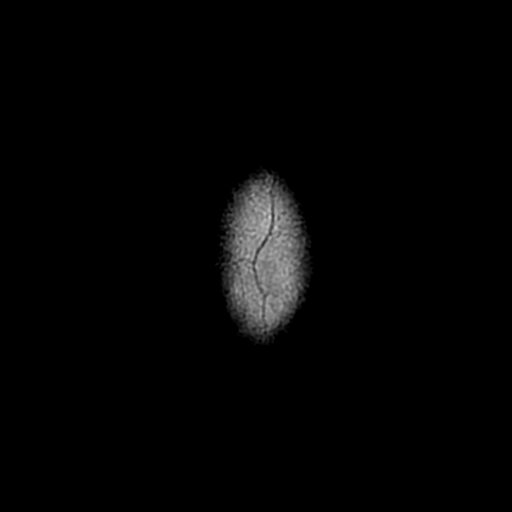

[Series 11: T2 · coronal · 5.0mm · 0.45mm/px · 2 of 27 slices shown (2 of 2)]
[im 1/27]
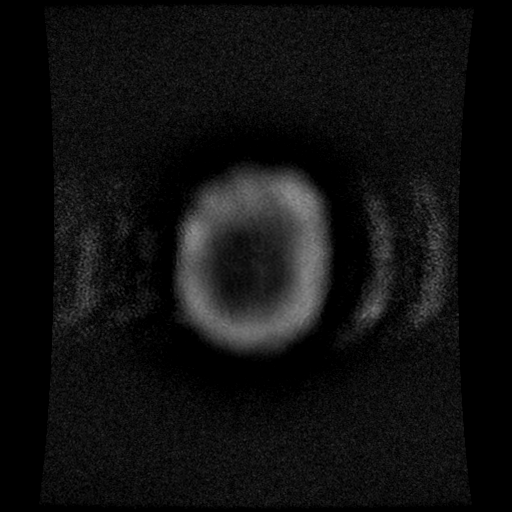
[im 27/27]
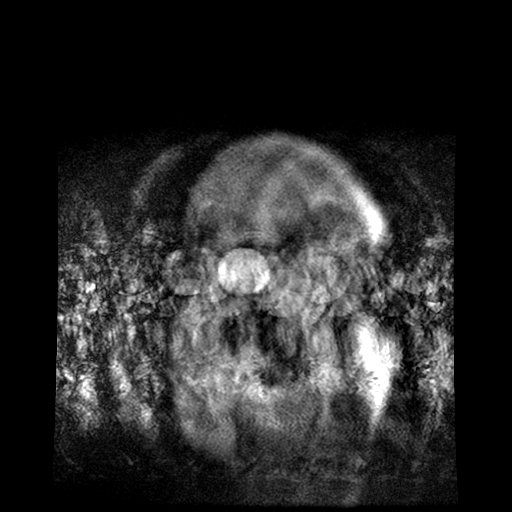

[Series 300: DWI · axial · 3.0mm · 1.09mm/px · z∈[-51,+105]mm · 4 of 54 slices shown (3 of 4)]
[im 1/54]
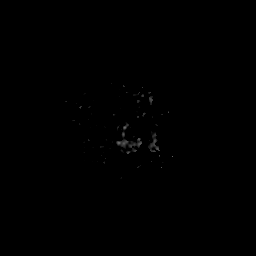
[im 18/54]
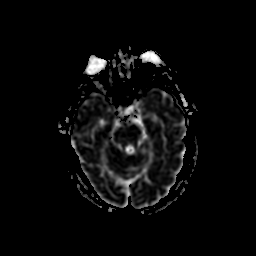
[im 36/54]
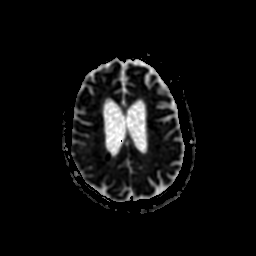
[im 54/54]
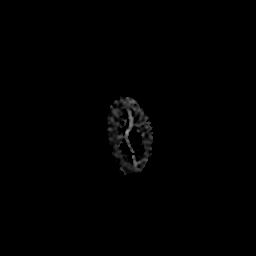

[Series 500: DWI · coronal · 4.0mm · 1.09mm/px · 3 of 36 slices shown (4 of 4)]
[im 1/36]
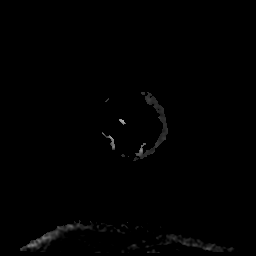
[im 18/36]
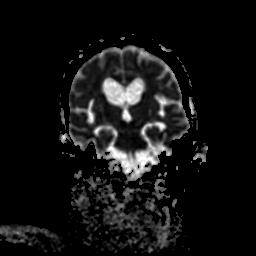
[im 36/36]
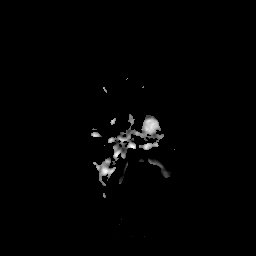

[33 of 48 positions shown; findings below may reference images not displayed]

FINDINGS: MRI HEAD FINDINGS

Brain: Clustered small foci of reduced diffusion compatible with
acute/early subacute infarction in the right superior frontoparietal
junction spanning 5.5 x 2.7 x 3.7 cm (AP x ML x CC) additional
punctate focus within the right insula. No associated hemorrhage or
mass effect.

Small chronic infarctions are present within the left cerebellar
hemisphere, bilateral lentiform nuclei, and the right anterior
corona radiata. Several nonspecific T2 FLAIR hyperintensities in
subcortical and periventricular white matter are compatible with
mild to moderate chronic microvascular ischemic changes for age.
Mild to moderate volume loss of the brain. No abnormal
susceptibility hypointensity to indicate intracranial hemorrhage. No
extra-axial collection, hydrocephalus, focal mass effect, or
herniation.

Vascular: As below.

Skull and upper cervical spine: Normal marrow signal.

Sinuses/Orbits: Negative.

Other: Bilateral intra-ocular lens replacement.

MRA HEAD FINDINGS

Internal carotid arteries:  Patent.

Anterior cerebral arteries: Patent. Right A3 tandem segment of
moderate to severe stenosis.

Middle cerebral arteries: Patent. Proximal right M2 superior
division short segment of severe stenosis/near occlusion (series 6,
image 110).

Anterior communicating artery: Patent.

Posterior communicating arteries: Not identified, likely hypoplastic
or absent.

Posterior cerebral arteries: Patent. Mild right P2 and mild left P1
short segments of stenosis.

Basilar artery:  Patent.

Vertebral arteries:  Patent.

No additional evidence of high-grade stenosis, large vessel
occlusion, or aneurysm.
IMPRESSION: MRI head:

1. Clustered small foci of acute/early subacute infarction within
the right superior frontoparietal junction spanning up to 5.5 cm.
Additional punctate infarct in the right insula. No associated
hemorrhage or mass effect.
2. Background of multiple small chronic infarctions and cerebellum
and basal ganglia. Mild-to-moderate chronic microvascular ischemic
changes and volume loss of the brain for age.

MRA head:

1. Proximal right M2 superior division short segment of severe
stenosis/near occlusion.
2. Tandem segments of moderate to severe stenosis in the right A3.
3. Mild right P2 and left P1 short segments of stenosis.
4. No additional large vessel occlusion, high-grade stenosis, or
aneurysm identified.

These results were called by telephone at the time of interpretation
on 02/24/2018 at [DATE] to Dr. Daskalaki, who verbally acknowledged
these results.

By: Thusari Shofik M.D.

## 2019-09-07 ENCOUNTER — Encounter: Payer: Self-pay | Admitting: Family Medicine

## 2019-09-08 MED ORDER — GLUCOSE BLOOD VI STRP: ORAL_STRIP | 6 refills | Status: DC

## 2019-09-10 ENCOUNTER — Encounter: Payer: Self-pay | Admitting: Family Medicine

## 2019-09-14 ENCOUNTER — Telehealth: Payer: Self-pay | Admitting: Family Medicine

## 2019-09-14 NOTE — Telephone Encounter (Signed)
Making TOC visit with [name].  Pt lives closer to [facility] and wants a female.

## 2019-09-30 DIAGNOSIS — E119 Type 2 diabetes mellitus without complications: Secondary | ICD-10-CM

## 2019-09-30 MED ORDER — BLOOD GLUCOSE MONITOR KIT: PACK | 0 refills | Status: DC

## 2019-09-30 NOTE — Telephone Encounter (Signed)
Fax as requested.

## 2019-09-30 NOTE — Telephone Encounter (Signed)
[name], please fax Rx to pharmacy. I will put this in your inbox.

## 2019-10-04 ENCOUNTER — Encounter: Payer: Medicare Other | Admitting: Family Medicine

## 2019-10-18 DIAGNOSIS — H34811 Central retinal vein occlusion, right eye, with macular edema: Secondary | ICD-10-CM | POA: Diagnosis not present

## 2019-10-18 DIAGNOSIS — I63412 Cerebral infarction due to embolism of left middle cerebral artery: Secondary | ICD-10-CM | POA: Diagnosis not present

## 2019-10-18 DIAGNOSIS — R414 Neurologic neglect syndrome: Secondary | ICD-10-CM | POA: Diagnosis not present

## 2019-10-18 DIAGNOSIS — Z961 Presence of intraocular lens: Secondary | ICD-10-CM | POA: Diagnosis not present

## 2019-10-18 DIAGNOSIS — H40013 Open angle with borderline findings, low risk, bilateral: Secondary | ICD-10-CM | POA: Diagnosis not present

## 2019-11-16 ENCOUNTER — Ambulatory Visit (INDEPENDENT_AMBULATORY_CARE_PROVIDER_SITE_OTHER): Payer: Medicare Other | Admitting: Podiatry

## 2019-11-16 ENCOUNTER — Other Ambulatory Visit: Payer: Self-pay

## 2019-11-16 DIAGNOSIS — E119 Type 2 diabetes mellitus without complications: Secondary | ICD-10-CM | POA: Diagnosis not present

## 2019-11-16 DIAGNOSIS — N183 Chronic kidney disease, stage 3 unspecified: Secondary | ICD-10-CM

## 2019-11-16 DIAGNOSIS — B351 Tinea unguium: Secondary | ICD-10-CM

## 2019-11-17 ENCOUNTER — Ambulatory Visit: Payer: Medicare Other | Admitting: Orthotics

## 2019-11-17 DIAGNOSIS — E119 Type 2 diabetes mellitus without complications: Secondary | ICD-10-CM

## 2019-11-17 DIAGNOSIS — L97419 Non-pressure chronic ulcer of right heel and midfoot with unspecified severity: Secondary | ICD-10-CM

## 2019-11-17 NOTE — Progress Notes (Signed)

## 2019-12-15 ENCOUNTER — Other Ambulatory Visit: Payer: Medicare Other | Admitting: Orthotics

## 2019-12-20 ENCOUNTER — Telehealth: Payer: Self-pay | Admitting: Family Medicine

## 2019-12-20 ENCOUNTER — Other Ambulatory Visit: Payer: Self-pay

## 2019-12-20 DIAGNOSIS — I63412 Cerebral infarction due to embolism of left middle cerebral artery: Secondary | ICD-10-CM | POA: Diagnosis not present

## 2019-12-20 DIAGNOSIS — R414 Neurologic neglect syndrome: Secondary | ICD-10-CM | POA: Diagnosis not present

## 2019-12-20 DIAGNOSIS — Z961 Presence of intraocular lens: Secondary | ICD-10-CM | POA: Diagnosis not present

## 2019-12-20 DIAGNOSIS — H34811 Central retinal vein occlusion, right eye, with macular edema: Secondary | ICD-10-CM | POA: Diagnosis not present

## 2019-12-20 DIAGNOSIS — H40013 Open angle with borderline findings, low risk, bilateral: Secondary | ICD-10-CM | POA: Diagnosis not present

## 2019-12-20 NOTE — Telephone Encounter (Signed)
Pt daughter called and said that the pt was supposed to get some shoes from [employer], She said that they said they had faxed over a form for Dr Ethelene Hal to fill out but havent heard anything back. I had them send it over again just in case and will keep an eye out for it.

## 2019-12-20 NOTE — Telephone Encounter (Signed)
You can disregard this, I talked to Selinda Eon and She told me it had already been filled out and faxed Last week, I checked the forms going to medical records and found it, I called the pts daughter and let her know and I did fax the form again to the [phone]. We do have a fax conformation from 12/15/19 at 1:48pm

## 2019-12-21 ENCOUNTER — Encounter: Payer: Self-pay | Admitting: Family Medicine

## 2019-12-21 ENCOUNTER — Ambulatory Visit (INDEPENDENT_AMBULATORY_CARE_PROVIDER_SITE_OTHER): Payer: Medicare Other | Admitting: Family Medicine

## 2019-12-21 VITALS — BP 164/80 | HR 73 | Temp 97.0°F | Ht 62.0 in | Wt 163.6 lb

## 2019-12-21 DIAGNOSIS — E119 Type 2 diabetes mellitus without complications: Secondary | ICD-10-CM | POA: Diagnosis not present

## 2019-12-21 DIAGNOSIS — N184 Chronic kidney disease, stage 4 (severe): Secondary | ICD-10-CM | POA: Diagnosis not present

## 2019-12-21 DIAGNOSIS — I10 Essential (primary) hypertension: Secondary | ICD-10-CM

## 2019-12-21 DIAGNOSIS — E7800 Pure hypercholesterolemia, unspecified: Secondary | ICD-10-CM

## 2019-12-21 DIAGNOSIS — D509 Iron deficiency anemia, unspecified: Secondary | ICD-10-CM | POA: Diagnosis not present

## 2019-12-21 LAB — URINALYSIS, ROUTINE W REFLEX MICROSCOPIC
Bilirubin Urine: NEGATIVE
Hgb urine dipstick: NEGATIVE
Ketones, ur: NEGATIVE
Leukocytes,Ua: NEGATIVE
Nitrite: NEGATIVE
RBC / HPF: NONE SEEN (ref 0–?)
Specific Gravity, Urine: 1.01 (ref 1.000–1.030)
Total Protein, Urine: 100 — AB
Urine Glucose: NEGATIVE
Urobilinogen, UA: 0.2 (ref 0.0–1.0)
pH: 7 (ref 5.0–8.0)

## 2019-12-21 LAB — VITAMIN B12: Vitamin B-12: >1526 pg/mL — ABNORMAL HIGH (ref 211–911)

## 2019-12-21 LAB — LIPID PANEL
Cholesterol: 267 mg/dL — ABNORMAL HIGH (ref 0–200)
HDL: 38.9 mg/dL — ABNORMAL LOW (ref >39.00)
Total CHOL/HDL Ratio: 7
Triglycerides: 407 mg/dL — ABNORMAL HIGH (ref 0.0–149.0)

## 2019-12-21 LAB — COMPREHENSIVE METABOLIC PANEL
ALT: 15 U/L (ref 0–35)
AST: 13 U/L (ref 0–37)
Albumin: 4.2 g/dL (ref 3.5–5.2)
Alkaline Phosphatase: 113 U/L (ref 39–117)
BUN: 22 mg/dL (ref 6–23)
CO2: 25 mEq/L (ref 19–32)
Calcium: 9.4 mg/dL (ref 8.4–10.5)
Chloride: 104 mEq/L (ref 96–112)
Creatinine, Ser: 1.78 mg/dL — ABNORMAL HIGH (ref 0.40–1.20)
GFR: 27.82 mL/min — ABNORMAL LOW (ref >60.00)
Glucose, Bld: 123 mg/dL — ABNORMAL HIGH (ref 70–99)
Potassium: 4.5 mEq/L (ref 3.5–5.1)
Sodium: 137 mEq/L (ref 135–145)
Total Bilirubin: 0.4 mg/dL (ref 0.2–1.2)
Total Protein: 6.7 g/dL (ref 6.0–8.3)

## 2019-12-21 LAB — CBC
HCT: 40 % (ref 36.0–46.0)
Hemoglobin: 13.6 g/dL (ref 12.0–15.0)
MCHC: 34 g/dL (ref 30.0–36.0)
MCV: 85 fl (ref 78.0–100.0)
Platelets: 269 10*3/uL (ref 150.0–400.0)
RBC: 4.71 Mil/uL (ref 3.87–5.11)
RDW: 13.9 % (ref 11.5–15.5)
WBC: 9 10*3/uL (ref 4.0–10.5)

## 2019-12-21 LAB — MICROALBUMIN / CREATININE URINE RATIO
Creatinine,U: 25.5 mg/dL
Microalb Creat Ratio: 219.3 mg/g — ABNORMAL HIGH (ref 0.0–30.0)
Microalb, Ur: 55.9 mg/dL — ABNORMAL HIGH (ref 0.0–1.9)

## 2019-12-21 LAB — HEMOGLOBIN A1C: Hgb A1c MFr Bld: 7.8 % — ABNORMAL HIGH (ref 4.6–6.5)

## 2019-12-21 LAB — LDL CHOLESTEROL, DIRECT: Direct LDL: 139 mg/dL

## 2019-12-21 NOTE — Addendum Note (Signed)
Addended by: Abelino Derrick A on: 12/21/2019 05:00 PM   Modules accepted: Orders

## 2019-12-21 NOTE — Progress Notes (Addendum)
Established Patient Office Visit  Subjective:  Patient ID: [name], female    DOB: 1945-11-08  Age: 74 y.o. MRN: [id number]  CC:  Chief Complaint  Patient presents with  . Transitions Of Care    Patient is here or TOC from Dr. Deborra Medina to Dr. Ethelene Hal. No concerns would like blood work drawn today.     HPI [name] presents for follow-up of her hypertension, diabetes, elevated cholesterol, history of iron deficiency anemia.  She is status post CV a.  She is semiambulatory and uses a walker at home but is otherwise in a wheelchair for longer distances.  She is accompanied by one of her daughters today.  Through her history she is quite sensitive to blood pressure medicines.  She does not tolerate blood pressures in the less than 505 systolic range.  Normal blood pressure at home is 150/90.  She becomes weak and lightheaded when her blood pressure goes below 150. She and her daughter maintain this claim.  Diabetes has not needed medical treatment other than dietary therapy.  Cholesterol is never been treated.  She has not had the Covid vaccine.  She says that Dr. Marjory Lies advised her not to get the Covid vaccine?  Her daughters had not not had the Covid vaccine either.  Past Medical History:  Diagnosis Date  . Allergy   . Arthritis   . Cataract   . Diabetes mellitus without complication ([place])   . Hypertension   . Macular degeneration syndrome    with edema---being treated.     Past Surgical History:  Procedure Laterality Date  . callous removal  Left 2017   located on 1st digit on L foot 2/2 diabetes  . EYE SURGERY      Family History  Problem Relation Age of Onset  . Kidney disease Mother   . Congestive Heart Failure Father   . COPD Father   . COPD Brother   . Diabetes Maternal Aunt     Social History   Socioeconomic History  . Marital status: Married    Spouse name: Rennis Harding  . Number of children: 2  . 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 Highest education level: High school graduate  Occupational History  . Not on file  Tobacco Use  . Smoking status: Never Smoker  . Smokeless tobacco: Never Used  Vaping Use  . Vaping Use: Never used  Substance and Sexual Activity  . Alcohol use: No  . Drug use: No  . Sexual activity: Not Currently  Other Topics Concern  . Not on file  Social History Narrative  . Not on file   Social Determinants of Health   Financial Resource Strain:   . Difficulty of Paying Living Expenses:   Food Insecurity:   . Worried About Charity fundraiser in the Last Year:   . Arboriculturist in the Last Year:   Transportation Needs:   . Film/video editor (Medical):   Marland Kitchen Lack of Transportation (Non-Medical):   Physical Activity:   . Days of Exercise per Week:   . Minutes of Exercise per Session:   Stress:   . Feeling of Stress :   Social Connections:   . Frequency of Communication with Friends and Family:   . Frequency of Social Gatherings with Friends and Family:   . Attends Religious Services:   . Active Member of Clubs or Organizations:   . Attends  Club or Organization Meetings:   Marland Kitchen Marital Status:   Intimate Partner Violence:   . Fear of Current or Ex-Partner:   . Emotionally Abused:   Marland Kitchen Physically Abused:   . Sexually Abused:     Outpatient Medications Prior to Visit  Medication Sig Dispense Refill  . aspirin EC 81 MG tablet Take 81 mg by mouth daily.    . blood glucose meter kit and supplies KIT Dispense based on patient and insurance preference. UAD bid for glucose monitoring Dx: E11.9 1 each 0  . cholecalciferol (VITAMIN D3) 25 MCG (1000 UNIT) tablet Take 5,000 Units by mouth daily.    . ferrous sulfate 324 MG TBEC Take 324 mg by mouth.    Marland Kitchen glucose blood test strip UAD TID for glucose monitoring Dx: E11.9 100 each 6  . Lancet Device MISC UAD bid for glucose monitoring Dx: E11.9 1 each prn  . losartan (COZAAR) 25 MG tablet TAKE 1 TABLET BY MOUTH TWICE A DAY  180 tablet 1  . OneTouch Delica Lancets 24O MISC USE TO TEST TWICE DAILY    . vitamin B-12 (CYANOCOBALAMIN) 500 MCG tablet Take 2,500 mcg by mouth daily.    Marland Kitchen amoxicillin-clavulanate (AUGMENTIN) 875-125 MG tablet Take 1 tablet by mouth 2 (two) times daily. (Patient not taking: Reported on 12/21/2019) 20 tablet 0   No facility-administered medications prior to visit.    Allergies  Allergen Reactions  . Clonidine Anaphylaxis, Swelling and Other (See Comments)    Tongue swelling  . Norvasc [Amlodipine Besylate] Swelling    Edema and TONGUE swelling   . Lisinopril Cough  . Amlodipine Anxiety, Nausea Only and Swelling  . Codeine Itching and Rash    ROS Review of Systems  Constitutional: Negative.   HENT: Negative.   Respiratory: Negative.   Cardiovascular: Negative.   Gastrointestinal: Negative.   Endocrine: Negative for polyphagia and polyuria.  Genitourinary: Negative.   Musculoskeletal: Positive for gait problem.  Skin: Negative for pallor and rash.  Neurological: Negative for speech difficulty and light-headedness.  Hematological: Does not bruise/bleed easily.   Depression screen [facility] 2/9 12/21/2019 12/31/2018 12/15/2018  Decreased Interest 0 0 0  Down, Depressed, Hopeless 0 0 0  PHQ - 2 Score 0 0 0  Altered sleeping 0 - -  Tired, decreased energy 1 - -  Change in appetite 0 - -  Feeling bad or failure about yourself  1 - -  Trouble concentrating 1 - -  Moving slowly or fidgety/restless 0 - -  Suicidal thoughts 0 - -  PHQ-9 Score 3 - -  Difficult doing work/chores Somewhat difficult - -  Some recent data might be hidden      Objective:    Physical Exam Vitals and nursing note reviewed.  Constitutional:      General: She is not in acute distress.    Appearance: Normal appearance. She is not ill-appearing or toxic-appearing.  HENT:     Head: Normocephalic and atraumatic.     Right Ear: External ear normal.     Left Ear: External ear normal.  Eyes:     General: No  scleral icterus.       Right eye: No discharge.        Left eye: No discharge.     Conjunctiva/sclera: Conjunctivae normal.  Cardiovascular:     Rate and Rhythm: Normal rate and regular rhythm.  Pulmonary:     Effort: Pulmonary effort is normal.     Breath sounds: Normal breath sounds.  Skin:    General: Skin is warm and dry.  Neurological:     Mental Status: She is alert and oriented to person, place, and time.  Psychiatric:        Mood and Affect: Mood normal.        Behavior: Behavior normal.     BP (!) 164/80   Pulse 73   Temp (!) 97 F (36.1 C) (Tympanic)   Ht '5\' 2"'$  (1.575 m)   Wt 163 lb 9.6 oz (74.2 kg)   SpO2 96%   BMI 29.92 kg/m  Wt Readings from Last 3 Encounters:  12/21/19 163 lb 9.6 oz (74.2 kg)  04/06/19 165 lb (74.8 kg)  03/01/19 165 lb (74.8 kg)     Health Maintenance Due  Topic Date Due  . Hepatitis C Screening  Never done  . COVID-19 Vaccine (1) Never done  . TETANUS/TDAP  Never done  . MAMMOGRAM  01/02/2005  . COLONOSCOPY  01/02/2011  . PNA vac Low Risk Adult (2 of 2 - PPSV23) 02/21/2016  . OPHTHALMOLOGY EXAM  02/18/2019  . FOOT EXAM  10/20/2019    There are no preventive care reminders to display for this patient.  Lab Results  Component Value Date   TSH 4.082 02/24/2018   Lab Results  Component Value Date   WBC 9.0 12/21/2019   HGB 13.6 12/21/2019   HCT 40.0 12/21/2019   MCV 85.0 12/21/2019   PLT 269.0 12/21/2019   Lab Results  Component Value Date   NA 137 12/21/2019   K 4.5 12/21/2019   CO2 25 12/21/2019   GLUCOSE 123 (H) 12/21/2019   BUN 22 12/21/2019   CREATININE 1.78 (H) 12/21/2019   BILITOT 0.4 12/21/2019   ALKPHOS 113 12/21/2019   AST 13 12/21/2019   ALT 15 12/21/2019   PROT 6.7 12/21/2019   ALBUMIN 4.2 12/21/2019   CALCIUM 9.4 12/21/2019   ANIONGAP 7 09/25/2018   GFR 27.82 (L) 12/21/2019   Lab Results  Component Value Date   CHOL 267 (H) 12/21/2019   Lab Results  Component Value Date   HDL 38.90 (L)  12/21/2019   Lab Results  Component Value Date   LDLCALC 116 (H) 03/23/2018   Lab Results  Component Value Date   TRIG (H) 12/21/2019    407.0 Triglyceride is over 400; calculations on Lipids are invalid.   Lab Results  Component Value Date   CHOLHDL 7 12/21/2019   Lab Results  Component Value Date   HGBA1C 7.8 (H) 12/21/2019      Assessment & Plan:   Problem List Items Addressed This Visit      Cardiovascular and Mediastinum   HTN (hypertension)   Relevant Medications   atorvastatin (LIPITOR) 10 MG tablet   Other Relevant Orders   CBC (Completed)   Comprehensive metabolic panel (Completed)   Urinalysis, Routine w reflex microscopic (Completed)   Microalbumin / creatinine urine ratio (Completed)   Ambulatory referral to Nephrology     Endocrine   Diabetes mellitus without complication (HCC) - Primary   Relevant Medications   atorvastatin (LIPITOR) 10 MG tablet   glipiZIDE (GLUCOTROL XL) 5 MG 24 hr tablet   Other Relevant Orders   Comprehensive metabolic panel (Completed)   Hemoglobin A1c (Completed)   Microalbumin / creatinine urine ratio (Completed)   Ambulatory referral to Ophthalmology     Genitourinary   Stage 4 chronic kidney disease ([place])   Relevant Orders   Comprehensive metabolic panel (Completed)   Microalbumin /  creatinine urine ratio (Completed)   Protein Electrophoresis, Urine Rflx.   Ambulatory referral to Nephrology     Other   Elevated cholesterol   Relevant Medications   atorvastatin (LIPITOR) 10 MG tablet   Other Relevant Orders   LDL cholesterol, direct (Completed)   Lipid panel (Completed)   Iron deficiency anemia   Relevant Medications   ferrous sulfate 324 MG TBEC   Other Relevant Orders   CBC (Completed)   Iron, TIBC and Ferritin Panel (Completed)   Vitamin B12 (Completed)      Meds ordered this encounter  Medications  . atorvastatin (LIPITOR) 10 MG tablet    Sig: Take 1 tablet (10 mg total) by mouth daily.    Dispense:   90 tablet    Refill:  3  . glipiZIDE (GLUCOTROL XL) 5 MG 24 hr tablet    Sig: Take 1 tablet (5 mg total) by mouth daily with breakfast.    Dispense:  30 tablet    Refill:  2    Follow-up: Return in about 3 months (around 03/22/2020).  Advised patient and her daughter that people do better with a lower blood pressures at least lower than 150/90.  Again the patient and her daughter protested.  Advised that she have the Covid vaccine.  I also addressed an awkward moment that I had with them with an E visit.  Another daughter felt as though patient had diverticulitis and wanted me to prescribe a Z-Pak. Advised that this was not appropriate treatment for diverticulitis.   Libby Maw, MD

## 2019-12-22 ENCOUNTER — Encounter: Payer: Self-pay | Admitting: Family Medicine

## 2019-12-22 DIAGNOSIS — N184 Chronic kidney disease, stage 4 (severe): Secondary | ICD-10-CM | POA: Diagnosis not present

## 2019-12-22 LAB — IRON,TIBC AND FERRITIN PANEL
%SAT: 23 % (calc) (ref 16–45)
Ferritin: 123 ng/mL (ref 16–288)
Iron: 67 ug/dL (ref 45–160)
TIBC: 288 mcg/dL (calc) (ref 250–450)

## 2019-12-22 MED ORDER — GLIPIZIDE ER 5 MG PO TB24: 5.0000 mg | ORAL_TABLET | Freq: Every day | ORAL | 2 refills | Status: DC

## 2019-12-22 MED ORDER — ATORVASTATIN CALCIUM 10 MG PO TABS: 10.0000 mg | ORAL_TABLET | Freq: Every day | ORAL | 3 refills | Status: DC

## 2019-12-22 NOTE — Telephone Encounter (Signed)
[facility] needing notes from visit in July 2020 and also most recent visit. Both faxed to number provided.

## 2019-12-22 NOTE — Addendum Note (Signed)
Addended by: Jon Billings on: 12/22/2019 07:37 AM   Modules accepted: Orders

## 2019-12-24 LAB — PROTEIN ELECTROPHORESIS, URINE REFLEX
Albumin ELP, Urine: 69.4 %
Alpha-1-Globulin, U: 5.7 %
Alpha-2-Globulin, U: 5.8 %
Beta Globulin, U: 9.1 %
Gamma Globulin, U: 10 %
Protein, Ur: 87 mg/dL

## 2019-12-28 ENCOUNTER — Encounter: Payer: Self-pay | Admitting: Primary Care

## 2020-01-19 NOTE — Progress Notes (Signed)
  Subjective:  Patient ID: [name], female    DOB: 1945-12-07,  MRN: [id number]  Chief Complaint  Patient presents with  . debride    diabetic nail tirmming  . Diabetes    FBS: 119 a1C: 6.1 PCP: Carlis Abbott x Feb   73 y.o. female presents with the above complaint. History confirmed with patient.  Objective:  Physical Exam: warm, good capillary refill, nail exam onychomycosis of the toenails, no trophic changes or ulcerative lesions, normal DP and PT pulses, and normal sensory exam.  Left Foot: normal exam, no swelling, tenderness, instability; ligaments intact, full range of motion of all ankle/foot joints  Right Foot: normal exam, no swelling, tenderness, instability; ligaments intact, full range of motion of all ankle/foot joints   No images are attached to the encounter.  Assessment:   1. Diabetes mellitus without complication (HCC)   2. Stage 3 chronic kidney disease, unspecified whether stage 3a or 3b CKD   3. Onychomycosis      Plan:  Patient was evaluated and treated and all questions answered.  Onychomycosis and Diabetes -Patient is diabetic with a qualifying condition for at risk foot care.    Procedure: Nail Debridement Rationale: Patient meets criteria for routine foot care due to CKD Type of Debridement: manual, sharp debridement. Instrumentation: Nail nipper, rotary burr. Number of Nails: 10  No follow-ups on file.

## 2020-01-20 ENCOUNTER — Telehealth: Payer: Self-pay | Admitting: Family Medicine

## 2020-01-20 NOTE — Telephone Encounter (Signed)
Spoke with patient's daughter Suszanne Conners, she stated she will call back to schedule once she check with the patient's scheduled

## 2020-01-27 ENCOUNTER — Other Ambulatory Visit: Payer: Self-pay

## 2020-01-27 MED ORDER — LOSARTAN POTASSIUM 25 MG PO TABS: 25.0000 mg | ORAL_TABLET | Freq: Two times a day (BID) | ORAL | 1 refills | Status: DC

## 2020-02-17 ENCOUNTER — Ambulatory Visit (INDEPENDENT_AMBULATORY_CARE_PROVIDER_SITE_OTHER): Payer: Medicare Other | Admitting: Podiatry

## 2020-02-17 ENCOUNTER — Encounter: Payer: Self-pay | Admitting: Podiatry

## 2020-02-17 ENCOUNTER — Other Ambulatory Visit: Payer: Self-pay

## 2020-02-17 DIAGNOSIS — B351 Tinea unguium: Secondary | ICD-10-CM

## 2020-02-17 DIAGNOSIS — Z794 Long term (current) use of insulin: Secondary | ICD-10-CM

## 2020-02-17 DIAGNOSIS — N184 Chronic kidney disease, stage 4 (severe): Secondary | ICD-10-CM | POA: Diagnosis not present

## 2020-02-17 DIAGNOSIS — M79674 Pain in right toe(s): Secondary | ICD-10-CM

## 2020-02-17 DIAGNOSIS — E0822 Diabetes mellitus due to underlying condition with diabetic chronic kidney disease: Secondary | ICD-10-CM

## 2020-02-17 DIAGNOSIS — M79675 Pain in left toe(s): Secondary | ICD-10-CM | POA: Diagnosis not present

## 2020-02-19 NOTE — Progress Notes (Signed)
Subjective:  Patient ID: [name], female    DOB: 1946/05/09,  MRN: [id number]  74 y.o. female presents with at risk foot care. Pt has h/o NIDDM with chronic kidney disease and painful thick toenails that are difficult to trim. Pain interferes with ambulation. Aggravating factors include wearing enclosed shoe gear. Pain is relieved with periodic professional debridement.   [name] presents to clinic with her daughter on today's visit. She has h/o b/l heel ulcers which have resolved. Also h/o CVA 2 years ago.   PCP is Dr. Libby [name]. Last visit was 12/21/2019.  Review of Systems: Negative except as noted in the HPI.  Past Medical History:  Diagnosis Date  . Allergy   . Arthritis   . Cataract   . Diabetes mellitus without complication ([place])   . Hypertension   . Macular degeneration syndrome    with edema---being treated.    Past Surgical History:  Procedure Laterality Date  . callous removal  Left 2017   located on 1st digit on L foot 2/2 diabetes  . EYE SURGERY     Patient Active Problem List   Diagnosis Date Noted  . Stage 4 chronic kidney disease ([place]) 12/21/2019  . Iron deficiency anemia 02/03/2019  . Pelvic fracture ([place]) 09/21/2018  . Allergic reaction caused by a drug 05/06/2018  . Bilateral leg edema 04/13/2018  . Elevated cholesterol 03/23/2018  . Chronic kidney disease (CKD), stage III (moderate)   . Embolic stroke ([place]) 60/63/0160  . Left hemiparesis ([place])   . Diabetes mellitus type 2 in nonobese (HCC)   . Acute ischemic stroke ([place])   . Left arm weakness   . Acute CVA (cerebrovascular accident) ([place]) 02/24/2018  . Overweight (BMI 25.0-29.9) 02/24/2018  . Diabetes mellitus without complication ([place]) 10/93/2355  . HTN (hypertension) 09/02/2017    Current Outpatient Medications:  .  aspirin EC 81 MG tablet, Take 81 mg by mouth daily., Disp: , Rfl:  .  atorvastatin (LIPITOR) 10 MG tablet, Take 1 tablet (10 mg total) by mouth daily., Disp:  90 tablet, Rfl: 3 .  blood glucose meter kit and supplies KIT, Dispense based on patient and insurance preference. UAD bid for glucose monitoring Dx: E11.9, Disp: 1 each, Rfl: 0 .  cholecalciferol (VITAMIN D3) 25 MCG (1000 UNIT) tablet, Take 5,000 Units by mouth daily., Disp: , Rfl:  .  ferrous sulfate 324 MG TBEC, Take 324 mg by mouth., Disp: , Rfl:  .  glipiZIDE (GLUCOTROL XL) 5 MG 24 hr tablet, Take 1 tablet (5 mg total) by mouth daily with breakfast., Disp: 30 tablet, Rfl: 2 .  glucose blood test strip, UAD TID for glucose monitoring Dx: E11.9, Disp: 100 each, Rfl: 6 .  Lancet Device MISC, UAD bid for glucose monitoring Dx: E11.9, Disp: 1 each, Rfl: prn .  losartan (COZAAR) 25 MG tablet, Take 1 tablet (25 mg total) by mouth 2 (two) times daily., Disp: 180 tablet, Rfl: 1 .  OneTouch Delica Lancets 73U MISC, USE TO TEST TWICE DAILY, Disp: , Rfl:  Allergies  Allergen Reactions  . Clonidine Anaphylaxis, Swelling and Other (See Comments)    Tongue swelling  . Norvasc [Amlodipine Besylate] Swelling    Edema and TONGUE swelling   . Lisinopril Cough  . Amlodipine Anxiety, Nausea Only and Swelling  . Codeine Itching and Rash   Social History   Tobacco Use  Smoking Status Never Smoker  Smokeless Tobacco Never Used   Objective:  There were no vitals filed  for this visit. Constitutional Patient is a pleasant 74 y.o. Caucasian female WD, WN in NAD.Marland Kitchen AAO x 3.  Vascular Capillary fill time to digits <3 seconds b/l lower extremities. Palpable pedal pulses b/l LE. Pedal hair sparse. Lower extremity skin temperature gradient within normal limits. No edema noted b/l lower extremities. No ischemia or gangrene noted b/l lower extremities. No cyanosis or clubbing noted.  Neurologic Normal speech. Oriented to person, place, and time. Protective sensation intact 5/5 intact bilaterally with 10g monofilament b/l. Vibratory sensation intact b/l.  Dermatologic Pedal skin with normal turgor, texture and  tone bilaterally. No open wounds bilaterally. No interdigital macerations bilaterally. Toenails 1-5 b/l elongated, discolored, dystrophic, thickened, crumbly with subungual debris and tenderness to dorsal palpation.  Orthopedic: Normal muscle strength 5/5 to all lower extremity muscle groups bilaterally. No pain crepitus or joint limitation noted with ROM b/l. No gross bony deformities bilaterally.   Hemoglobin A1C Latest Ref Rng & Units 12/21/2019 08/17/2019  HGBA1C 4.6 - 6.5 % 7.8(H) 6.6(H)  Some recent data might be hidden   Assessment:   1. Pain due to onychomycosis of toenails of both feet   2. Diabetes mellitus due to underlying condition with stage 4 chronic kidney disease, with long-term current use of insulin ([place])    Plan:  Patient was evaluated and treated and all questions answered.  Onychomycosis with pain -Nails palliatively debridement as below. -Educated on self-care  Procedure: Nail Debridement Rationale: Pain Type of Debridement: manual, sharp debridement. Instrumentation: Nail nipper, rotary burr. Number of Nails: 10  -Examined patient. -Continue diabetic foot care principles. -Toenails 1-5 b/l were debrided in length and girth with sterile nail nippers and dremel without iatrogenic bleeding.  -Patient to report any pedal injuries to medical professional immediately. -Patient to apply Aquaphor Ointment to both feet every other day. Daughter related understanding. -Patient to continue soft, supportive shoe gear daily. -Patient/POA to call should there be question/concern in the interim.  Return in about 3 months (around 05/19/2020).  [name], DPM

## 2020-02-20 ENCOUNTER — Encounter: Payer: Self-pay | Admitting: Podiatry

## 2020-02-24 ENCOUNTER — Telehealth: Payer: Self-pay | Admitting: Family Medicine

## 2020-02-24 NOTE — Telephone Encounter (Signed)
Spoke with patients daughter who verbally understood we have received form and it will be faxed as soon as dr. Ethelene Hal have a chance to sign.

## 2020-02-24 NOTE — Telephone Encounter (Signed)
Patient's daughter called to check status of paperwork for shoes that was fax by [facility]. Please call her to advise

## 2020-03-14 ENCOUNTER — Other Ambulatory Visit: Payer: Self-pay | Admitting: Family Medicine

## 2020-03-14 DIAGNOSIS — E119 Type 2 diabetes mellitus without complications: Secondary | ICD-10-CM

## 2020-03-31 ENCOUNTER — Ambulatory Visit: Payer: Medicare Other | Admitting: Orthotics

## 2020-03-31 ENCOUNTER — Other Ambulatory Visit: Payer: Self-pay

## 2020-03-31 DIAGNOSIS — Z794 Long term (current) use of insulin: Secondary | ICD-10-CM

## 2020-03-31 DIAGNOSIS — N184 Chronic kidney disease, stage 4 (severe): Secondary | ICD-10-CM

## 2020-03-31 DIAGNOSIS — E119 Type 2 diabetes mellitus without complications: Secondary | ICD-10-CM

## 2020-03-31 DIAGNOSIS — B351 Tinea unguium: Secondary | ICD-10-CM

## 2020-03-31 NOTE — Progress Notes (Signed)
Needs to be reordered 8.5 W

## 2020-04-18 ENCOUNTER — Ambulatory Visit: Payer: Medicare Other | Admitting: Orthotics

## 2020-04-18 ENCOUNTER — Other Ambulatory Visit: Payer: Self-pay

## 2020-04-18 DIAGNOSIS — E119 Type 2 diabetes mellitus without complications: Secondary | ICD-10-CM

## 2020-04-18 DIAGNOSIS — L97419 Non-pressure chronic ulcer of right heel and midfoot with unspecified severity: Secondary | ICD-10-CM

## 2020-04-18 DIAGNOSIS — M79674 Pain in right toe(s): Secondary | ICD-10-CM

## 2020-04-18 DIAGNOSIS — B351 Tinea unguium: Secondary | ICD-10-CM

## 2020-04-18 NOTE — Progress Notes (Signed)
Shoes not here in [place]; ordered two shoes..835 ortho in 8.5 w; also ordere Sure fit MEN's NB 813 velcro in 7 2e;  Daughter will p/u in [place] and return the other one; this due to patient too feeble to keep making trips to p/up reordered shoes.  Shoes were billed in May

## 2020-04-19 ENCOUNTER — Encounter: Payer: Self-pay | Admitting: Family Medicine

## 2020-04-19 ENCOUNTER — Other Ambulatory Visit: Payer: Medicare Other | Admitting: Orthotics

## 2020-04-19 ENCOUNTER — Ambulatory Visit (INDEPENDENT_AMBULATORY_CARE_PROVIDER_SITE_OTHER): Payer: Medicare Other | Admitting: Family Medicine

## 2020-04-19 VITALS — BP 204/72 | HR 73 | Temp 98.4°F | Ht 62.0 in | Wt 164.6 lb

## 2020-04-19 DIAGNOSIS — G8194 Hemiplegia, unspecified affecting left nondominant side: Secondary | ICD-10-CM | POA: Diagnosis not present

## 2020-04-19 DIAGNOSIS — E119 Type 2 diabetes mellitus without complications: Secondary | ICD-10-CM

## 2020-04-19 DIAGNOSIS — I10 Essential (primary) hypertension: Secondary | ICD-10-CM | POA: Diagnosis not present

## 2020-04-19 DIAGNOSIS — N184 Chronic kidney disease, stage 4 (severe): Secondary | ICD-10-CM | POA: Diagnosis not present

## 2020-04-19 DIAGNOSIS — Z1159 Encounter for screening for other viral diseases: Secondary | ICD-10-CM

## 2020-04-19 NOTE — Patient Instructions (Signed)
We have to get blood pressure under control!!!!!  I want you to make sure you are taking cozaar (losartan) 25mg  twice a day and we will see you back in 3 months. If this stays high just continues to hurt kidneys and increase risk of stroke.   - I want you to start cholesterol medication, but we will check levels first.   Ill see you back in 3 months to see blood pressure.

## 2020-04-19 NOTE — Progress Notes (Signed)
Patient: [name] MRN: [id number] DOB: [birth year] PCP: Orma Flaming, MD     Subjective:  Chief Complaint  Patient presents with  . Transitions Of Care  . Hypertension  . Diabetes  . Stroke  . Chronic Kidney Disease    HPI: The patient is a 74 y.o. female who presents today for TOC. She is accompanied by her daughter.   Hypertension: Here for follow up of hypertension.  Currently on losartan 12.5mg  BID . Home readings range from 086-578 IONGEXBM/84 diastolic. Takes medication as prescribed and denies any side effects. Exercise includes none. Weight has been stable. Denies any chest pain, headaches, shortness of breath, vision changes, swelling in lower extremities. Extremely elevated, asymptomatic.   Diabetes: Patient is here for follow up of type 2 diabetes. First diagnosed more than 20 years ago.  Currently on the following medications none. Takes medications as prescribed. Last A1C was 7.8, but before this her a1c was to goal at 6.6. Currently not exercising and following diabetic diet. Sugars range from 120 to 180. Denies any hypoglycemic events. Denies any vision changes, nausea, vomiting, abdominal pain, ulcers/paraesthesia in feet, polyuria, polydipsia or polyphagia. Denies any chest pain, shortness of breath.   History of CVA Was in 2019. ? Ischemic. She was on lipitor and unsure why they stopped this. Want to see where she is at with this. 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   CKD stage 4  Likely secondary to uncontrolled HTN. Extremely high today.   Review of Systems  Respiratory: Negative for cough and shortness of breath.   Cardiovascular: Negative for chest pain and palpitations.  Genitourinary: Negative for frequency and pelvic pain.  Musculoskeletal: Negative for back pain.  Neurological: Negative for dizziness and headaches.    Allergies Patient is allergic to clonidine, norvasc  [amlodipine besylate], lisinopril, amlodipine, and codeine.  Past Medical History Patient  has a past medical history of Allergy, Arthritis, Cataract, Diabetes mellitus without complication ([place]), Hypertension, and Macular degeneration syndrome.  Surgical History Patient  has a past surgical history that includes callous removal  (Left, 2017) and Eye surgery.  Family History Pateint's family history includes COPD in her brother and father; Congestive Heart Failure in her father; Diabetes in her maternal aunt; Kidney disease in her mother.  Social History Patient  reports that she has never smoked. She has never used smokeless tobacco. She reports that she does not drink alcohol and does not use drugs.    Objective: Vitals:   04/19/20 1009  BP: (!) 204/72  Pulse: 73  Temp: 98.4 F (36.9 C)  TempSrc: Temporal  SpO2: 97%  Weight: 164 lb 9.6 oz (74.7 kg)  Height: 5\' 2"  (1.575 m)    Body mass index is 30.11 kg/m.  Physical Exam Vitals reviewed.  Constitutional:      Appearance: Normal appearance. She is obese.     Comments: In wheelchair   HENT:     Head: Normocephalic and atraumatic.  Cardiovascular:     Rate and Rhythm: Normal rate and regular rhythm.     Heart sounds: Normal heart sounds.  Pulmonary:     Effort: Pulmonary effort is normal.     Breath sounds: Normal breath sounds.  Abdominal:     General: Bowel sounds are normal.     Palpations: Abdomen is soft.  Skin:    General: Skin is warm.  Neurological:     General: No focal deficit  present.     Mental Status: She is alert and oriented to person, place, and time.     Comments: Left sided weakness. At baseline.   Psychiatric:        Mood and Affect: Mood normal.        Behavior: Behavior normal.      Office Visit from 12/21/2019 in [facility]  PHQ-2 Total Score 0         Assessment/plan: 1. Primary hypertension Extremely elevated and above goal. Home readings are much better and  daughter states they have calibrated her cuff. She is asymptomatic. I want them to increase blood pressure medication to 25mg  BID and we will have f/u in 3 months. Routine labs today. Of note, daughter called and stated her mom's bp was 152/80 when she checked back at home. ER precautions given. Dicussed we have to get this better controlled to help protect kidneys as well as decrease risk of recurrent stroke/heart attack.  F/iu in 3 months with log.  - CBC with Differential/Platelet; Future - Lipid panel; Future - COMPLETE METABOLIC PANEL WITH GFR; Future - COMPLETE METABOLIC PANEL WITH GFR - Lipid panel - CBC with Differential/Platelet  2. Diabetes mellitus without complication ([place]) B5Z was to goal on diet alone. They really would like some time to work on this. Discussed would like her controlled with her CKD and HTN and will need to watch this closely as I donut want her kidney damage to progress.  - Hemoglobin A1c; Future - Lipid panel; Future - TSH; Future - VITAMIN D 25 Hydroxy (Vit-D Deficiency, Fractures); Future - VITAMIN D 25 Hydroxy (Vit-D Deficiency, Fractures) - TSH - Lipid panel - Hemoglobin A1c  3. Stage 4 chronic kidney disease ([place]) Likely secondary to poorly controlled blood pressure, but unsure of this. Discussed we have to get her blood pressure down and better controlled slowly. See above for plan. Will continue to monitor renal function regularly.  - COMPLETE METABOLIC PANEL WITH GFR; Future - COMPLETE METABOLIC PANEL WITH GFR  4. Left hemiparesis ([place]) Home health ordered.  - Ambulatory referral to Home Health  5. Encounter for hepatitis C screening test for low risk patient  - Hepatitis C antibody  Needs statin, will get labs back first and see how her cholesterol is, but discussed with her and daughter would strongly advise she be on this.    This visit occurred during the SARS-CoV-2 public health emergency.  Safety protocols were in place, including  screening questions prior to the visit, additional usage of staff PPE, and extensive cleaning of exam room while observing appropriate contact time as indicated for disinfecting solutions.     Return in 3 months (on 07/20/2020) for blood pressure .    Orma Flaming, MD [place]   04/19/2020

## 2020-04-20 ENCOUNTER — Encounter: Payer: Self-pay | Admitting: Family Medicine

## 2020-04-20 ENCOUNTER — Other Ambulatory Visit: Payer: Self-pay | Admitting: Family Medicine

## 2020-04-20 LAB — COMPLETE METABOLIC PANEL WITH GFR
AG Ratio: 1.6 (calc) (ref 1.0–2.5)
ALT: 13 U/L (ref 6–29)
AST: 13 U/L (ref 10–35)
Albumin: 4 g/dL (ref 3.6–5.1)
Alkaline phosphatase (APISO): 100 U/L (ref 37–153)
BUN/Creatinine Ratio: 15 (calc) (ref 6–22)
BUN: 28 mg/dL — ABNORMAL HIGH (ref 7–25)
CO2: 23 mmol/L (ref 20–32)
Calcium: 9.1 mg/dL (ref 8.6–10.4)
Chloride: 106 mmol/L (ref 98–110)
Creat: 1.82 mg/dL — ABNORMAL HIGH (ref 0.60–0.93)
GFR, Est African American: 31 mL/min/{1.73_m2} — ABNORMAL LOW (ref >=60)
GFR, Est Non African American: 27 mL/min/{1.73_m2} — ABNORMAL LOW (ref >=60)
Globulin: 2.5 g/dL (calc) (ref 1.9–3.7)
Glucose, Bld: 122 mg/dL — ABNORMAL HIGH (ref 65–99)
Potassium: 4.4 mmol/L (ref 3.5–5.3)
Sodium: 139 mmol/L (ref 135–146)
Total Bilirubin: 0.4 mg/dL (ref 0.2–1.2)
Total Protein: 6.5 g/dL (ref 6.1–8.1)

## 2020-04-20 LAB — CBC WITH DIFFERENTIAL/PLATELET
Absolute Monocytes: 647 cells/uL (ref 200–950)
Basophils Absolute: 109 cells/uL (ref 0–200)
Basophils Relative: 1.3 %
Eosinophils Absolute: 605 cells/uL — ABNORMAL HIGH (ref 15–500)
Eosinophils Relative: 7.2 %
HCT: 41.1 % (ref 35.0–45.0)
Hemoglobin: 13.5 g/dL (ref 11.7–15.5)
Lymphs Abs: 1915 cells/uL (ref 850–3900)
MCH: 28.5 pg (ref 27.0–33.0)
MCHC: 32.8 g/dL (ref 32.0–36.0)
MCV: 86.7 fL (ref 80.0–100.0)
MPV: 11.2 fL (ref 7.5–12.5)
Monocytes Relative: 7.7 %
Neutro Abs: 5124 cells/uL (ref 1500–7800)
Neutrophils Relative %: 61 %
Platelets: 270 10*3/uL (ref 140–400)
RBC: 4.74 10*6/uL (ref 3.80–5.10)
RDW: 13.8 % (ref 11.0–15.0)
Total Lymphocyte: 22.8 %
WBC: 8.4 10*3/uL (ref 3.8–10.8)

## 2020-04-20 LAB — LIPID PANEL
Cholesterol: 244 mg/dL — ABNORMAL HIGH (ref <200)
HDL: 37 mg/dL — ABNORMAL LOW (ref >=50)
LDL Cholesterol (Calc): 148 mg/dL (calc) — ABNORMAL HIGH
Non-HDL Cholesterol (Calc): 207 mg/dL (calc) — ABNORMAL HIGH (ref <130)
Total CHOL/HDL Ratio: 6.6 (calc) — ABNORMAL HIGH (ref <5.0)
Triglycerides: 381 mg/dL — ABNORMAL HIGH (ref <150)

## 2020-04-20 LAB — HEPATITIS C ANTIBODY
Hepatitis C Ab: NONREACTIVE
SIGNAL TO CUT-OFF: 0.01 (ref <1.00)

## 2020-04-20 LAB — TSH: TSH: 2.16 mIU/L (ref 0.40–4.50)

## 2020-04-20 LAB — VITAMIN D 25 HYDROXY (VIT D DEFICIENCY, FRACTURES): Vit D, 25-Hydroxy: 57 ng/mL (ref 30–100)

## 2020-04-20 LAB — HEMOGLOBIN A1C
Hgb A1c MFr Bld: 7 % of total Hgb — ABNORMAL HIGH (ref <5.7)
Mean Plasma Glucose: 154 (calc)
eAG (mmol/L): 8.5 (calc)

## 2020-04-20 MED ORDER — ROSUVASTATIN CALCIUM 10 MG PO TABS: 10.0000 mg | ORAL_TABLET | Freq: Every day | ORAL | 3 refills | Status: DC

## 2020-05-02 DIAGNOSIS — E103393 Type 1 diabetes mellitus with moderate nonproliferative diabetic retinopathy without macular edema, bilateral: Secondary | ICD-10-CM | POA: Diagnosis not present

## 2020-05-02 DIAGNOSIS — H40013 Open angle with borderline findings, low risk, bilateral: Secondary | ICD-10-CM | POA: Diagnosis not present

## 2020-05-02 DIAGNOSIS — I63412 Cerebral infarction due to embolism of left middle cerebral artery: Secondary | ICD-10-CM | POA: Diagnosis not present

## 2020-05-02 DIAGNOSIS — H353211 Exudative age-related macular degeneration, right eye, with active choroidal neovascularization: Secondary | ICD-10-CM | POA: Diagnosis not present

## 2020-05-02 DIAGNOSIS — H34811 Central retinal vein occlusion, right eye, with macular edema: Secondary | ICD-10-CM | POA: Diagnosis not present

## 2020-05-02 LAB — HM DIABETES EYE EXAM

## 2020-05-03 ENCOUNTER — Encounter: Payer: Self-pay | Admitting: Family Medicine

## 2020-05-03 DIAGNOSIS — H269 Unspecified cataract: Secondary | ICD-10-CM | POA: Insufficient documentation

## 2020-05-10 ENCOUNTER — Other Ambulatory Visit: Payer: Self-pay | Admitting: Family Medicine

## 2020-05-10 MED ORDER — CARVEDILOL 3.125 MG PO TABS: 3.1250 mg | ORAL_TABLET | Freq: Two times a day (BID) | ORAL | 1 refills | Status: DC

## 2020-05-10 NOTE — Progress Notes (Signed)
Daughter called and stated blood pressure is still 794-801 systolic with increase of her blood pressure medication.   We are going to add on low dose beta blocker as they are very sensitive to medication. Allergy to norvasc/lisinopril and has CKD so options are limited.  Continue with losartan 50mg /day.   Orma Flaming, MD [place]

## 2020-05-22 ENCOUNTER — Other Ambulatory Visit: Payer: Self-pay

## 2020-05-22 ENCOUNTER — Ambulatory Visit (INDEPENDENT_AMBULATORY_CARE_PROVIDER_SITE_OTHER): Payer: Medicare Other | Admitting: Podiatry

## 2020-05-22 DIAGNOSIS — E0822 Diabetes mellitus due to underlying condition with diabetic chronic kidney disease: Secondary | ICD-10-CM

## 2020-05-22 DIAGNOSIS — N184 Chronic kidney disease, stage 4 (severe): Secondary | ICD-10-CM

## 2020-05-22 DIAGNOSIS — B351 Tinea unguium: Secondary | ICD-10-CM | POA: Diagnosis not present

## 2020-05-22 DIAGNOSIS — Z794 Long term (current) use of insulin: Secondary | ICD-10-CM

## 2020-05-22 NOTE — Progress Notes (Signed)
  Subjective:  Patient ID: [name], female    DOB: January 20, 1946,  MRN: [id number]  No chief complaint on file.  74 y.o. female presents for follow-up. Denies new pedal issues. Objective:  Physical Exam: warm, good capillary refill, nail exam onychomycosis of the toenails, no trophic changes or ulcerative lesions, normal DP and PT pulses, and normal sensory exam.  Left Foot: normal exam, no swelling, tenderness, instability; ligaments intact, full range of motion of all ankle/foot joints  Right Foot: normal exam, no swelling, tenderness, instability; ligaments intact, full range of motion of all ankle/foot joints   No images are attached to the encounter.  Assessment:   1. Diabetes mellitus due to underlying condition with stage 4 chronic kidney disease, with long-term current use of insulin ([place])   2. Onychomycosis    Plan:  Patient was evaluated and treated and all questions answered.  Onychomycosis and Diabetes -Patient is diabetic with a qualifying condition for at risk foot care. Due to CKD. -DM shoes dispensed.  Procedure: Nail Debridement Type of Debridement: manual, sharp debridement. Instrumentation: Nail nipper, rotary burr. Number of Nails: 10  Return in about 9 weeks (around 07/24/2020) for Diabetic Foot Care.

## 2020-06-02 ENCOUNTER — Other Ambulatory Visit: Payer: Self-pay | Admitting: Family Medicine

## 2020-06-18 ENCOUNTER — Other Ambulatory Visit: Payer: Self-pay | Admitting: Family Medicine

## 2020-07-06 ENCOUNTER — Other Ambulatory Visit (HOSPITAL_COMMUNITY): Payer: Self-pay

## 2020-07-06 ENCOUNTER — Other Ambulatory Visit: Payer: Self-pay | Admitting: Family Medicine

## 2020-07-06 ENCOUNTER — Other Ambulatory Visit: Payer: Self-pay | Admitting: Nurse Practitioner

## 2020-07-06 DIAGNOSIS — U071 COVID-19: Secondary | ICD-10-CM

## 2020-07-06 MED ORDER — FLUOXETINE HCL 20 MG PO TABS: 20.0000 mg | ORAL_TABLET | Freq: Every day | ORAL | 0 refills | Status: DC

## 2020-07-06 MED ORDER — DOXYCYCLINE HYCLATE 100 MG PO TABS: 100.0000 mg | ORAL_TABLET | Freq: Two times a day (BID) | ORAL | 0 refills | Status: DC

## 2020-07-06 MED ORDER — FLUOXETINE HCL 10 MG PO CAPS: 10.0000 mg | ORAL_CAPSULE | Freq: Every day | ORAL | 0 refills | Status: DC

## 2020-07-06 NOTE — Progress Notes (Signed)
+  covid. High risk. Referred to infusion -prozac daily x 10 days -already on vitamins/mouthwash/iodine drops -ivm x 5 days -doxycycline.  ? If day 6-8 of illness. Doing well. Oxygen 97%.  Precautions given.  Dr. Rogers Blocker

## 2020-07-06 NOTE — Progress Notes (Signed)
I connected by phone with patient to discuss the potential use of a new treatment for mild to moderate COVID-19 viral infection in non-hospitalized patients.   This patient is a that meets the FDA criteria for Emergency Use Authorization of COVID monoclonal antibody  1. Has a (+) direct SARS-CoV-2 viral test result 2. Has mild or moderate COVID-19  3. Is NOT hospitalized due to COVID-19 4. Is within 7 days of symptom onset 5. Has at least one of the high risk factor(s) for progression to severe COVID-19 and/or hospitalization as defined in EUA. Specific high risk criteria :age, dm, ckd, obesity, age    I have spoken and communicated the following to the patient or parent/caregiver regarding COVID monoclonal antibody treatment:   1. FDA has authorized the emergency use for the treatment of high risk post-exposure prophylaxis for COVID19.    2. The significant known and potential risks and benefits of COVID monoclonal antibody, and the extent to which such potential risks and benefits are unknown.    3. Patients treated with COVID monoclonal antibody should continue to      self-isolate and use infection control measures (e.g., wear mask, isolate, social distance, avoid sharing personal items, clean and disinfect "high touch" surfaces, and frequent handwashing) according to CDC guidelines.    4.  The patient or parent/caregiver has the option to accept or refuse COVID monoclonal antibody treatment.  5. Recommendations for COVID vaccine in addition to MAB infusion as antibodies infused are not permanent and will not provide long term protection from COVID infections   After reviewing this information with the patient, The patient agreed to proceed with receiving monoclonal antibody infusion and will be provided a copy of the Fact sheet prior to receiving the infusion. Mariana Kaufman, NP

## 2020-07-07 ENCOUNTER — Ambulatory Visit (HOSPITAL_COMMUNITY)
Admission: RE | Admit: 2020-07-07 | Discharge: 2020-07-07 | Disposition: A | Discharge status: Home / Self Care | Payer: Medicare Other | Source: Ambulatory Visit | Attending: Pulmonary Disease | Admitting: Pulmonary Disease

## 2020-07-07 DIAGNOSIS — R54 Age-related physical debility: Secondary | ICD-10-CM | POA: Insufficient documentation

## 2020-07-07 DIAGNOSIS — I129 Hypertensive chronic kidney disease with stage 1 through stage 4 chronic kidney disease, or unspecified chronic kidney disease: Secondary | ICD-10-CM | POA: Diagnosis not present

## 2020-07-07 DIAGNOSIS — Z6825 Body mass index (BMI) 25.0-25.9, adult: Secondary | ICD-10-CM | POA: Insufficient documentation

## 2020-07-07 DIAGNOSIS — U071 COVID-19: Secondary | ICD-10-CM | POA: Diagnosis not present

## 2020-07-07 DIAGNOSIS — N189 Chronic kidney disease, unspecified: Secondary | ICD-10-CM | POA: Diagnosis not present

## 2020-07-07 DIAGNOSIS — E1122 Type 2 diabetes mellitus with diabetic chronic kidney disease: Secondary | ICD-10-CM | POA: Insufficient documentation

## 2020-07-07 MED ORDER — SODIUM CHLORIDE 0.9 % IV SOLN: Freq: Once | INTRAVENOUS | Status: AC

## 2020-07-07 MED ORDER — METHYLPREDNISOLONE SODIUM SUCC 125 MG IJ SOLR: 125.0000 mg | Freq: Once | INTRAMUSCULAR | Status: DC | PRN

## 2020-07-07 MED ORDER — EPINEPHRINE 0.3 MG/0.3ML IJ SOAJ: 0.3000 mg | Freq: Once | INTRAMUSCULAR | Status: DC | PRN

## 2020-07-07 MED ORDER — DIPHENHYDRAMINE HCL 50 MG/ML IJ SOLN: 50.0000 mg | Freq: Once | INTRAMUSCULAR | Status: DC | PRN

## 2020-07-07 MED ORDER — FAMOTIDINE IN NACL 20-0.9 MG/50ML-% IV SOLN: 20.0000 mg | Freq: Once | INTRAVENOUS | Status: DC | PRN

## 2020-07-07 MED ORDER — SODIUM CHLORIDE 0.9 % IV SOLN: INTRAVENOUS | Status: DC | PRN

## 2020-07-07 MED ORDER — ALBUTEROL SULFATE HFA 108 (90 BASE) MCG/ACT IN AERS: 2.0000 | INHALATION_SPRAY | Freq: Once | RESPIRATORY_TRACT | Status: DC | PRN

## 2020-07-07 NOTE — Progress Notes (Signed)
  Diagnosis: COVID-19  Physician: Asencion Noble, MD  Procedure: Covid Infusion Clinic Med: casirivimab\imdevimab infusion - Provided patient with casirivimab\imdevimab fact sheet for patients, parents and caregivers prior to infusion.  Complications: No immediate complications noted.  Discharge: Discharged home   Janne Napoleon 07/07/2020

## 2020-07-07 NOTE — Discharge Instructions (Signed)
10 Things You Can Do to Manage Your COVID-19 Symptoms at Home If you have possible or confirmed COVID-19: 1. Stay home from work and school. And stay away from other public places. If you must go out, avoid using any kind of public transportation, ridesharing, or taxis. 2. Monitor your symptoms carefully. If your symptoms get worse, call your healthcare provider immediately. 3. Get rest and stay hydrated. 4. If you have a medical appointment, call the healthcare provider ahead of time and tell them that you have or may have COVID-19. 5. For medical emergencies, call 911 and notify the dispatch personnel that you have or may have COVID-19. 6. Cover your cough and sneezes with a tissue or use the inside of your elbow. 7. Wash your hands often with soap and water for at least 20 seconds or clean your hands with an alcohol-based hand sanitizer that contains at least 60% alcohol. 8. As much as possible, stay in a specific room and away from other people in your home. Also, you should use a separate bathroom, if available. If you need to be around other people in or outside of the home, wear a mask. 9. Avoid sharing personal items with other people in your household, like dishes, towels, and bedding. 10. Clean all surfaces that are touched often, like counters, tabletops, and doorknobs. Use household cleaning sprays or wipes according to the label instructions. cdc.gov/coronavirus 01/06/2019 This information is not intended to replace advice given to you by your health care provider. Make sure you discuss any questions you have with your health care provider. Document Revised: 06/10/2019 Document Reviewed: 06/10/2019 Elsevier Patient Education  2020 Elsevier Inc. What types of side effects do monoclonal antibody drugs cause?  Common side effects  In general, the more common side effects caused by monoclonal antibody drugs include: . Allergic reactions, such as hives or itching . Flu-like signs and  symptoms, including chills, fatigue, fever, and muscle aches and pains . Nausea, vomiting . Diarrhea . Skin rashes . Low blood pressure   The CDC is recommending patients who receive monoclonal antibody treatments wait at least 90 days before being vaccinated.  Currently, there are no data on the safety and efficacy of mRNA COVID-19 vaccines in persons who received monoclonal antibodies or convalescent plasma as part of COVID-19 treatment. Based on the estimated half-life of such therapies as well as evidence suggesting that reinfection is uncommon in the 90 days after initial infection, vaccination should be deferred for at least 90 days, as a precautionary measure until additional information becomes available, to avoid interference of the antibody treatment with vaccine-induced immune responses. If you have any questions or concerns after the infusion please call the Advanced Practice Provider on call at [phone]. This number is ONLY intended for your use regarding questions or concerns about the infusion post-treatment side-effects.  Please do not provide this number to others for use. For return to work notes please contact your primary care provider.   If someone you know is interested in receiving treatment please have them call the COVID hotline at [phone].   

## 2020-07-07 NOTE — Progress Notes (Signed)
Patient reviewed Fact Sheet for Patients, Parents, and Caregivers for Emergency Use Authorization (EUA) of REGEN-COV for the Treatment of Coronavirus. Patient also reviewed and is agreeable to the estimated cost of treatment. Patient is agreeable to proceed.    

## 2020-07-24 ENCOUNTER — Ambulatory Visit: Payer: Medicare Other | Admitting: Podiatry

## 2020-07-26 ENCOUNTER — Telehealth (INDEPENDENT_AMBULATORY_CARE_PROVIDER_SITE_OTHER): Payer: Medicare Other | Admitting: Family Medicine

## 2020-07-26 ENCOUNTER — Encounter: Payer: Self-pay | Admitting: Family Medicine

## 2020-07-26 VITALS — BP 165/80 | HR 79 | Ht 62.0 in | Wt 164.7 lb

## 2020-07-26 DIAGNOSIS — B029 Zoster without complications: Secondary | ICD-10-CM | POA: Diagnosis not present

## 2020-07-26 MED ORDER — VALACYCLOVIR HCL 1 G PO TABS: 1000.0000 mg | ORAL_TABLET | Freq: Every day | ORAL | 0 refills | Status: DC

## 2020-07-26 NOTE — Progress Notes (Signed)
Patient: [name] MRN: [id number] DOB: August 29, 1945 PCP: Orma Flaming, MD     I connected with [name] on 07/26/20 at 12:09 pm by a video enabled telemedicine application and verified that I am speaking with the correct person using two identifiers.  Location patient: Home Location provider: [facility], Office Persons participating in this virtual visit: Bethanne Ginger, Dr. Rogers Blocker and Hester Mates (daughter)   I discussed the limitations of evaluation and management by telemedicine and the availability of in person appointments. The patient expressed understanding and agreed to proceed.   Subjective:  Chief Complaint  Patient presents with  . Rash    Vaginal    HPI: The patient is a 75 y.o. female who presents today for vaginal rash. Starting 2-3 days ago. She had no prodromal symptoms. She felt a bump and wanted her daughter to look down there and see if she saw something. It also really itched her bad last night. When [name] looked it was a cluster of bumps in a group on a red base. She describes it as pustules. She has used vinegar on this. She has had shingles about 10 years. She also recently had covid and had the infusion. She states it feels like an intense diaper rash. Rash is located on mons pubis.   Review of Systems  Genitourinary: Negative for pelvic pain, vaginal bleeding, vaginal discharge and vaginal pain.  Skin: Positive for rash.    Allergies Patient is allergic to clonidine, norvasc [amlodipine besylate], lisinopril, amlodipine, and codeine.  Past Medical History Patient  has a past medical history of Allergy, Arthritis, Cataract, Diabetes mellitus without complication ([place]), Hypertension, and Macular degeneration syndrome.  Surgical History Patient  has a past surgical history that includes callous removal  (Left, 2017) and Eye surgery.  Family History Pateint's family history includes COPD in her brother and father; Congestive Heart Failure in  her father; Diabetes in her maternal aunt; Kidney disease in her mother.  Social History Patient  reports that she has never smoked. She has never used smokeless tobacco. She reports that she does not drink alcohol and does not use drugs.    Objective: Vitals:   07/26/20 1155  BP: (!) 165/80  Pulse: 79  SpO2: 96%  Weight: 164 lb 10.9 oz (74.7 kg)  Height: '5\' 2"'$  (1.575 m)    Body mass index is 30.12 kg/m.  Physical Exam Vitals reviewed.  Constitutional:      Appearance: Normal appearance. She is normal weight.  HENT:     Head: Normocephalic and atraumatic.  Pulmonary:     Effort: Pulmonary effort is normal.  Skin:    Findings: Rash (based off picture on mons pubis (cluster of raised lesions on redness base with yellow top) ) present.  Neurological:     Mental Status: She is alert.        Assessment/plan: 1. Herpes zoster without complication -discussed based off picture looks like either shingles vs. Herpes. Can not determine unless we cultured and they can not come into office due to poor roads. Will renally dose for her. Stop vinegar as this can be more irritating. She is due for f/u with me office this month. Any issues or worsening they are to let me know.     Return if symptoms worsen or fail to improve.    Orma Flaming, MD [place]  07/26/2020

## 2020-07-29 ENCOUNTER — Other Ambulatory Visit: Payer: Self-pay | Admitting: Family Medicine

## 2020-08-02 ENCOUNTER — Encounter: Payer: Self-pay | Admitting: Family Medicine

## 2020-08-02 DIAGNOSIS — H353211 Exudative age-related macular degeneration, right eye, with active choroidal neovascularization: Secondary | ICD-10-CM | POA: Diagnosis not present

## 2020-08-02 DIAGNOSIS — H40013 Open angle with borderline findings, low risk, bilateral: Secondary | ICD-10-CM | POA: Diagnosis not present

## 2020-08-02 DIAGNOSIS — H34811 Central retinal vein occlusion, right eye, with macular edema: Secondary | ICD-10-CM | POA: Diagnosis not present

## 2020-08-02 DIAGNOSIS — R414 Neurologic neglect syndrome: Secondary | ICD-10-CM | POA: Diagnosis not present

## 2020-08-02 DIAGNOSIS — I63412 Cerebral infarction due to embolism of left middle cerebral artery: Secondary | ICD-10-CM | POA: Diagnosis not present

## 2020-08-02 DIAGNOSIS — Z961 Presence of intraocular lens: Secondary | ICD-10-CM | POA: Diagnosis not present

## 2020-08-02 DIAGNOSIS — E103393 Type 1 diabetes mellitus with moderate nonproliferative diabetic retinopathy without macular edema, bilateral: Secondary | ICD-10-CM | POA: Diagnosis not present

## 2020-08-02 NOTE — Telephone Encounter (Signed)
Please Advise

## 2020-08-18 ENCOUNTER — Other Ambulatory Visit: Payer: Self-pay

## 2020-08-18 ENCOUNTER — Ambulatory Visit (INDEPENDENT_AMBULATORY_CARE_PROVIDER_SITE_OTHER): Payer: Medicare Other | Admitting: Family Medicine

## 2020-08-18 ENCOUNTER — Encounter: Payer: Self-pay | Admitting: Family Medicine

## 2020-08-18 VITALS — BP 200/80 | HR 70 | Temp 98.1°F | Ht 62.0 in | Wt 161.0 lb

## 2020-08-18 DIAGNOSIS — N184 Chronic kidney disease, stage 4 (severe): Secondary | ICD-10-CM

## 2020-08-18 DIAGNOSIS — E119 Type 2 diabetes mellitus without complications: Secondary | ICD-10-CM | POA: Diagnosis not present

## 2020-08-18 DIAGNOSIS — I10 Essential (primary) hypertension: Secondary | ICD-10-CM | POA: Diagnosis not present

## 2020-08-18 LAB — CBC WITH DIFFERENTIAL/PLATELET
Basophils Absolute: 0.1 10*3/uL (ref 0.0–0.1)
Basophils Relative: 0.8 % (ref 0.0–3.0)
Eosinophils Absolute: 0.5 10*3/uL (ref 0.0–0.7)
Eosinophils Relative: 5 % (ref 0.0–5.0)
HCT: 41.3 % (ref 36.0–46.0)
Hemoglobin: 14 g/dL (ref 12.0–15.0)
Lymphocytes Relative: 20.6 % (ref 12.0–46.0)
Lymphs Abs: 1.9 10*3/uL (ref 0.7–4.0)
MCHC: 33.9 g/dL (ref 30.0–36.0)
MCV: 85.9 fl (ref 78.0–100.0)
Monocytes Absolute: 0.7 10*3/uL (ref 0.1–1.0)
Monocytes Relative: 7.2 % (ref 3.0–12.0)
Neutro Abs: 6.1 10*3/uL (ref 1.4–7.7)
Neutrophils Relative %: 66.4 % (ref 43.0–77.0)
Platelets: 258 10*3/uL (ref 150.0–400.0)
RBC: 4.81 Mil/uL (ref 3.87–5.11)
RDW: 13.6 % (ref 11.5–15.5)
WBC: 9.1 10*3/uL (ref 4.0–10.5)

## 2020-08-18 LAB — LIPID PANEL
Cholesterol: 250 mg/dL — ABNORMAL HIGH (ref 0–200)
HDL: 38.1 mg/dL — ABNORMAL LOW (ref >39.00)
NonHDL: 211.92
Total CHOL/HDL Ratio: 7
Triglycerides: 351 mg/dL — ABNORMAL HIGH (ref 0.0–149.0)
VLDL: 70.2 mg/dL — ABNORMAL HIGH (ref 0.0–40.0)

## 2020-08-18 LAB — COMPLETE METABOLIC PANEL WITH GFR
AG Ratio: 1.5 (calc) (ref 1.0–2.5)
ALT: 13 U/L (ref 6–29)
AST: 13 U/L (ref 10–35)
Albumin: 4 g/dL (ref 3.6–5.1)
Alkaline phosphatase (APISO): 92 U/L (ref 37–153)
BUN/Creatinine Ratio: 16 (calc) (ref 6–22)
BUN: 28 mg/dL — ABNORMAL HIGH (ref 7–25)
CO2: 23 mmol/L (ref 20–32)
Calcium: 9.3 mg/dL (ref 8.6–10.4)
Chloride: 105 mmol/L (ref 98–110)
Creat: 1.78 mg/dL — ABNORMAL HIGH (ref 0.60–0.93)
GFR, Est African American: 32 mL/min/{1.73_m2} — ABNORMAL LOW (ref >=60)
GFR, Est Non African American: 27 mL/min/{1.73_m2} — ABNORMAL LOW (ref >=60)
Globulin: 2.7 g/dL (calc) (ref 1.9–3.7)
Glucose, Bld: 126 mg/dL — ABNORMAL HIGH (ref 65–99)
Potassium: 4.4 mmol/L (ref 3.5–5.3)
Sodium: 139 mmol/L (ref 135–146)
Total Bilirubin: 0.5 mg/dL (ref 0.2–1.2)
Total Protein: 6.7 g/dL (ref 6.1–8.1)

## 2020-08-18 LAB — MAGNESIUM: Magnesium: 2.3 mg/dL (ref 1.5–2.5)

## 2020-08-18 LAB — LDL CHOLESTEROL, DIRECT: Direct LDL: 149 mg/dL

## 2020-08-18 MED ORDER — METOPROLOL SUCCINATE ER 25 MG PO TB24: 25.0000 mg | ORAL_TABLET | Freq: Every day | ORAL | 3 refills | Status: DC

## 2020-08-18 MED ORDER — EZETIMIBE 10 MG PO TABS
10.0000 mg | ORAL_TABLET | Freq: Every day | ORAL | 3 refills | Status: DC
Start: 2020-08-18 — End: 2020-09-18

## 2020-08-18 NOTE — Progress Notes (Signed)
Patient: [name] MRN: [id number] DOB: 09/26/1945 PCP: Orma Flaming, MD     Subjective:  Chief Complaint  Patient presents with  . Diabetes  . Hypertension  . Chronic Kidney Disease  . Hyperlipidemia    HPI: The patient is a 75 y.o. female who presents today for chronic follow up.   Hypertension: Here for follow up of hypertension.  Currently on losartan 12.'5mg'$  BID. Was on coreg, but she tells me she could not tolerate this. It's not a true allergy, but states she just "felt bad" and it made her dizzy."  Home readings range from [id number] diastolic. Takes medication as prescribed and denies any side effects. Exercise includes none. Weight has been stable. Denies any chest pain, headaches, shortness of breath, vision changes, swelling in lower extremities. Extremely elevated, asymptomatic. she can not take so many medications.   Diabetes: Patient is here for follow up of type 2 diabetes. First diagnosed more than 20 years ago.  Currently on the following medications none.  Last A1C was 7.0.  Currently not exercising and following diabetic diet. Sugars range from 120. Denies any hypoglycemic events. Denies any vision changes, nausea, vomiting, abdominal pain, ulcers/paraesthesia in feet, polyuria, polydipsia or polyphagia. Denies any chest pain, shortness of breath. she has been eating better per daughter.   Hx of CVA -I put her on crestor at her last visit. She couldn't tolerate it and it made her sick. She thinks she did zetia in the past and did okay with this.   She is now interested in therapy for OT/PT.   Had covid with infusion back in January. Still has some fatigue, but otherwise recovered.   Review of Systems  Constitutional: Negative for chills, fatigue and fever.  HENT: Negative for dental problem, ear pain, hearing loss and trouble swallowing.   Eyes: Negative for visual disturbance.  Respiratory: Negative for cough, chest tightness and shortness of  breath.   Cardiovascular: Negative for chest pain, palpitations and leg swelling.  Gastrointestinal: Negative for abdominal pain, blood in stool, diarrhea and nausea.  Endocrine: Negative for cold intolerance, polydipsia, polyphagia and polyuria.  Genitourinary: Negative for dysuria, hematuria, pelvic pain and urgency.  Musculoskeletal: Negative for arthralgias.  Skin: Negative for rash.  Neurological: Positive for weakness (left side, at baseline). Negative for dizziness and headaches.  Psychiatric/Behavioral: Negative for dysphoric mood and sleep disturbance. The patient is not nervous/anxious.     Allergies Patient is allergic to clonidine, norvasc [amlodipine besylate], lisinopril, amlodipine, and codeine.  Past Medical History Patient  has a past medical history of Allergy, Arthritis, Cataract, Diabetes mellitus without complication ([place]), Hypertension, and Macular degeneration syndrome.  Surgical History Patient  has a past surgical history that includes callous removal  (Left, 2017) and Eye surgery.  Family History Pateint's family history includes COPD in her brother and father; Congestive Heart Failure in her father; Diabetes in her maternal aunt; Kidney disease in her mother.  Social History Patient  reports that she has never smoked. She has never used smokeless tobacco. She reports that she does not drink alcohol and does not use drugs.    Objective: Vitals:   08/18/20 1026 08/18/20 1100  BP: (!) 190/78 (!) 200/80  Pulse: 70   Temp: 98.1 F (36.7 C)   TempSrc: Temporal   SpO2: 96%   Weight: 161 lb (73 kg)   Height: '5\' 2"'$  (1.575 m)     Body mass index is 29.45 kg/m.  Physical Exam Vitals reviewed.  Constitutional:  Appearance: Normal appearance. She is obese.  HENT:     Head: Normocephalic and atraumatic.  Neck:     Vascular: No carotid bruit.  Cardiovascular:     Rate and Rhythm: Normal rate and regular rhythm.     Heart sounds: Normal heart sounds.   Pulmonary:     Effort: Pulmonary effort is normal.     Breath sounds: Normal breath sounds.  Abdominal:     General: Abdomen is flat. Bowel sounds are normal.     Palpations: Abdomen is soft.  Musculoskeletal:     Cervical back: Normal range of motion and neck supple.  Lymphadenopathy:     Cervical: No cervical adenopathy.  Neurological:     Mental Status: She is alert.  Psychiatric:        Mood and Affect: Mood normal.        Behavior: Behavior normal.        Assessment/plan: 1. Primary hypertension Above goal. Can not tolerate so many medication. Unsure if true allergy to norvasc and it seemed to control her well, but has documented that she had tongue swelling. im going to try her on another beta blocker to see if she can tolerate. Will do toprol '25mg'$  daily. I had long discussion with them about importance of getting this controlled and if she truly is not tolerating these medications. She only continues to damage kidneys and increase risk of stroke or heart attack. See back in one month.  - COMPLETE METABOLIC PANEL WITH GFR; Future - Lipid panel; Future - CBC with Differential/Platelet; Future  2. Diabetes mellitus without complication ([place]) Was controlled for age at last visit, but will check today. Continue with diet modifications.  - Hemoglobin A1c; Future  3. Stage 4 chronic kidney disease ([facility])  - Magnesium  5. Hyperlipidemia Will do trial of zetia. Discussed importance of control with hx of  Cva/diabtes. Advised they start only with toprol to see if any side effects before adding zetia.   -will hold off on pt/ot until we get blood pressure better controlled.   This visit occurred during the SARS-CoV-2 public health emergency.  Safety protocols were in place, including screening questions prior to the visit, additional usage of staff PPE, and extensive cleaning of exam room while observing appropriate contact time as indicated for disinfecting solutions.       Return in about 1 month (around 09/15/2020) for blood pressure check .    Orma Flaming, MD [place]    08/18/2020

## 2020-08-18 NOTE — Patient Instructions (Signed)
1) okay for blood pressure we are going to try extended release metoprolol. Can take this at night. Start this first  2) for cholesterol let's try zetia. I would wait and start this after you have been on the metoprolol for a while to know if you have any reaction.   3) see you back in 1 month for blood pressure recheck!   Labs today!   aw

## 2020-08-21 LAB — HEMOGLOBIN A1C: Hgb A1c MFr Bld: 7.3 % — ABNORMAL HIGH (ref 4.6–6.5)

## 2020-08-22 ENCOUNTER — Encounter: Payer: Self-pay | Admitting: Family Medicine

## 2020-08-23 ENCOUNTER — Other Ambulatory Visit: Payer: Self-pay

## 2020-08-23 MED ORDER — GLUCOSE BLOOD VI STRP: ORAL_STRIP | 6 refills | Status: DC

## 2020-09-03 ENCOUNTER — Encounter: Payer: Self-pay | Admitting: Family Medicine

## 2020-09-18 ENCOUNTER — Ambulatory Visit (INDEPENDENT_AMBULATORY_CARE_PROVIDER_SITE_OTHER): Payer: BC Managed Care – PPO | Admitting: Podiatry

## 2020-09-18 ENCOUNTER — Other Ambulatory Visit: Payer: Self-pay

## 2020-09-18 DIAGNOSIS — E0822 Diabetes mellitus due to underlying condition with diabetic chronic kidney disease: Secondary | ICD-10-CM

## 2020-09-18 DIAGNOSIS — N184 Chronic kidney disease, stage 4 (severe): Secondary | ICD-10-CM

## 2020-09-18 DIAGNOSIS — Z794 Long term (current) use of insulin: Secondary | ICD-10-CM | POA: Diagnosis not present

## 2020-09-18 DIAGNOSIS — B351 Tinea unguium: Secondary | ICD-10-CM

## 2020-09-18 NOTE — Progress Notes (Signed)
  Subjective:  Patient ID: [name], female    DOB: 09/04/1945,  MRN: [id number]  Chief Complaint  Patient presents with  . debride    Fbs; 125 a1C: 6.9 PCP: wolf x feb   74 y.o. female presents for follow-up. Denies new pedal issues. Objective:  Physical Exam: warm, good capillary refill, nail exam onychomycosis of the toenails, no trophic changes or ulcerative lesions, normal DP and PT pulses, and normal sensory exam.  Left Foot: normal exam, no swelling, tenderness, instability; ligaments intact, full range of motion of all ankle/foot joints  Right Foot: normal exam, no swelling, tenderness, instability; ligaments intact, full range of motion of all ankle/foot joints   No images are attached to the encounter.  Assessment:   1. 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 Onychomycosis    Plan:  Patient was evaluated and treated and all questions answered.  Onychomycosis and Diabetes -Patient is diabetic with a qualifying condition for at risk foot care. Due to CKD.  Procedure: Nail Debridement Type of Debridement: manual, sharp debridement. Instrumentation: Nail nipper, rotary burr. Number of Nails: 10     No follow-ups on file.

## 2020-10-19 ENCOUNTER — Inpatient Hospital Stay (HOSPITAL_COMMUNITY)
Admission: EM | Admit: 2020-10-19 | Discharge: 2020-10-26 | DRG: 563 | Disposition: A | Discharge status: Home Health | Payer: Medicare Other | Attending: Internal Medicine | Admitting: Internal Medicine

## 2020-10-19 ENCOUNTER — Emergency Department (HOSPITAL_COMMUNITY): Payer: Medicare Other

## 2020-10-19 ENCOUNTER — Encounter (HOSPITAL_COMMUNITY): Payer: Self-pay

## 2020-10-19 ENCOUNTER — Other Ambulatory Visit: Payer: Self-pay

## 2020-10-19 DIAGNOSIS — I129 Hypertensive chronic kidney disease with stage 1 through stage 4 chronic kidney disease, or unspecified chronic kidney disease: Secondary | ICD-10-CM | POA: Diagnosis present

## 2020-10-19 DIAGNOSIS — N179 Acute kidney failure, unspecified: Secondary | ICD-10-CM

## 2020-10-19 DIAGNOSIS — R609 Edema, unspecified: Secondary | ICD-10-CM | POA: Diagnosis not present

## 2020-10-19 DIAGNOSIS — E1122 Type 2 diabetes mellitus with diabetic chronic kidney disease: Secondary | ICD-10-CM | POA: Diagnosis not present

## 2020-10-19 DIAGNOSIS — Z66 Do not resuscitate: Secondary | ICD-10-CM | POA: Diagnosis present

## 2020-10-19 DIAGNOSIS — I69952 Hemiplegia and hemiparesis following unspecified cerebrovascular disease affecting left dominant side: Secondary | ICD-10-CM | POA: Diagnosis not present

## 2020-10-19 DIAGNOSIS — Z20822 Contact with and (suspected) exposure to covid-19: Secondary | ICD-10-CM | POA: Diagnosis present

## 2020-10-19 DIAGNOSIS — I69354 Hemiplegia and hemiparesis following cerebral infarction affecting left non-dominant side: Secondary | ICD-10-CM

## 2020-10-19 DIAGNOSIS — Z6829 Body mass index (BMI) 29.0-29.9, adult: Secondary | ICD-10-CM | POA: Diagnosis not present

## 2020-10-19 DIAGNOSIS — M62838 Other muscle spasm: Secondary | ICD-10-CM | POA: Diagnosis present

## 2020-10-19 DIAGNOSIS — M2550 Pain in unspecified joint: Secondary | ICD-10-CM | POA: Diagnosis not present

## 2020-10-19 DIAGNOSIS — W19XXXA Unspecified fall, initial encounter: Secondary | ICD-10-CM

## 2020-10-19 DIAGNOSIS — Z841 Family history of disorders of kidney and ureter: Secondary | ICD-10-CM | POA: Diagnosis not present

## 2020-10-19 DIAGNOSIS — E119 Type 2 diabetes mellitus without complications: Secondary | ICD-10-CM

## 2020-10-19 DIAGNOSIS — G8194 Hemiplegia, unspecified affecting left nondominant side: Secondary | ICD-10-CM | POA: Diagnosis present

## 2020-10-19 DIAGNOSIS — E663 Overweight: Secondary | ICD-10-CM | POA: Diagnosis present

## 2020-10-19 DIAGNOSIS — Z885 Allergy status to narcotic agent status: Secondary | ICD-10-CM | POA: Diagnosis not present

## 2020-10-19 DIAGNOSIS — L89611 Pressure ulcer of right heel, stage 1: Secondary | ICD-10-CM | POA: Diagnosis not present

## 2020-10-19 DIAGNOSIS — H3530 Unspecified macular degeneration: Secondary | ICD-10-CM | POA: Diagnosis not present

## 2020-10-19 DIAGNOSIS — Z8673 Personal history of transient ischemic attack (TIA), and cerebral infarction without residual deficits: Secondary | ICD-10-CM

## 2020-10-19 DIAGNOSIS — S82891A Other fracture of right lower leg, initial encounter for closed fracture: Secondary | ICD-10-CM

## 2020-10-19 DIAGNOSIS — Z888 Allergy status to other drugs, medicaments and biological substances status: Secondary | ICD-10-CM | POA: Diagnosis not present

## 2020-10-19 DIAGNOSIS — S82899A Other fracture of unspecified lower leg, initial encounter for closed fracture: Secondary | ICD-10-CM | POA: Diagnosis present

## 2020-10-19 DIAGNOSIS — S82841A Displaced bimalleolar fracture of right lower leg, initial encounter for closed fracture: Secondary | ICD-10-CM | POA: Diagnosis not present

## 2020-10-19 DIAGNOSIS — Z79899 Other long term (current) drug therapy: Secondary | ICD-10-CM | POA: Diagnosis not present

## 2020-10-19 DIAGNOSIS — M25561 Pain in right knee: Secondary | ICD-10-CM | POA: Diagnosis not present

## 2020-10-19 DIAGNOSIS — W1830XA Fall on same level, unspecified, initial encounter: Secondary | ICD-10-CM | POA: Diagnosis present

## 2020-10-19 DIAGNOSIS — N184 Chronic kidney disease, stage 4 (severe): Secondary | ICD-10-CM | POA: Diagnosis not present

## 2020-10-19 DIAGNOSIS — Z7982 Long term (current) use of aspirin: Secondary | ICD-10-CM

## 2020-10-19 DIAGNOSIS — Z8249 Family history of ischemic heart disease and other diseases of the circulatory system: Secondary | ICD-10-CM | POA: Diagnosis not present

## 2020-10-19 DIAGNOSIS — R6889 Other general symptoms and signs: Secondary | ICD-10-CM | POA: Diagnosis not present

## 2020-10-19 DIAGNOSIS — G459 Transient cerebral ischemic attack, unspecified: Secondary | ICD-10-CM | POA: Diagnosis not present

## 2020-10-19 DIAGNOSIS — I10 Essential (primary) hypertension: Secondary | ICD-10-CM | POA: Diagnosis present

## 2020-10-19 DIAGNOSIS — Z7401 Bed confinement status: Secondary | ICD-10-CM | POA: Diagnosis not present

## 2020-10-19 DIAGNOSIS — I639 Cerebral infarction, unspecified: Secondary | ICD-10-CM | POA: Diagnosis not present

## 2020-10-19 DIAGNOSIS — M25551 Pain in right hip: Secondary | ICD-10-CM | POA: Diagnosis not present

## 2020-10-19 DIAGNOSIS — M79604 Pain in right leg: Secondary | ICD-10-CM | POA: Diagnosis not present

## 2020-10-19 DIAGNOSIS — L89621 Pressure ulcer of left heel, stage 1: Secondary | ICD-10-CM | POA: Diagnosis present

## 2020-10-19 DIAGNOSIS — Z743 Need for continuous supervision: Secondary | ICD-10-CM | POA: Diagnosis not present

## 2020-10-19 DIAGNOSIS — M7989 Other specified soft tissue disorders: Secondary | ICD-10-CM | POA: Diagnosis not present

## 2020-10-19 HISTORY — DX: Chronic kidney disease, stage 4 (severe): N18.4

## 2020-10-19 LAB — CBC
HCT: 40.2 % (ref 36.0–46.0)
Hemoglobin: 13.1 g/dL (ref 12.0–15.0)
MCH: 29 pg (ref 26.0–34.0)
MCHC: 32.6 g/dL (ref 30.0–36.0)
MCV: 88.9 fL (ref 80.0–100.0)
Platelets: 174 10*3/uL (ref 150–400)
RBC: 4.52 MIL/uL (ref 3.87–5.11)
RDW: 13.1 % (ref 11.5–15.5)
WBC: 9.4 10*3/uL (ref 4.0–10.5)
nRBC: 0 % (ref 0.0–0.2)

## 2020-10-19 LAB — GLUCOSE, CAPILLARY
Glucose-Capillary: 111 mg/dL — ABNORMAL HIGH (ref 70–99)
Glucose-Capillary: 114 mg/dL — ABNORMAL HIGH (ref 70–99)

## 2020-10-19 LAB — BASIC METABOLIC PANEL
Anion gap: 10 (ref 5–15)
BUN: 36 mg/dL — ABNORMAL HIGH (ref 8–23)
CO2: 15 mmol/L — ABNORMAL LOW (ref 22–32)
Calcium: 8.6 mg/dL — ABNORMAL LOW (ref 8.9–10.3)
Chloride: 112 mmol/L — ABNORMAL HIGH (ref 98–111)
Creatinine, Ser: 2.01 mg/dL — ABNORMAL HIGH (ref 0.44–1.00)
GFR, Estimated: 25 mL/min — ABNORMAL LOW (ref >60)
Glucose, Bld: 160 mg/dL — ABNORMAL HIGH (ref 70–99)
Potassium: 4.2 mmol/L (ref 3.5–5.1)
Sodium: 137 mmol/L (ref 135–145)

## 2020-10-19 LAB — SARS CORONAVIRUS 2 (TAT 6-24 HRS): SARS Coronavirus 2: NEGATIVE

## 2020-10-19 MED ORDER — METOPROLOL SUCCINATE ER 25 MG PO TB24
25.0000 mg | ORAL_TABLET | Freq: Every day | ORAL | Status: DC
  Administered 2020-10-20 – 2020-10-24 (×5): 25 mg via ORAL
  Filled 2020-10-19 (×6): qty 1

## 2020-10-19 MED ORDER — POLYETHYLENE GLYCOL 3350 17 G PO PACK: 17.0000 g | PACK | Freq: Every day | ORAL | Status: DC | PRN

## 2020-10-19 MED ORDER — MORPHINE SULFATE (PF) 2 MG/ML IV SOLN
2.0000 mg | INTRAVENOUS | Status: DC | PRN
  Administered 2020-10-19: 2 mg via INTRAVENOUS
  Filled 2020-10-19: qty 1

## 2020-10-19 MED ORDER — HYDROCODONE-ACETAMINOPHEN 5-325 MG PO TABS
1.0000 | ORAL_TABLET | Freq: Once | ORAL | Status: AC
Start: 2020-10-19 — End: 2020-10-19
  Administered 2020-10-19: 1 via ORAL
  Filled 2020-10-19: qty 1

## 2020-10-19 MED ORDER — CALCIUM CARBONATE ANTACID 1250 MG/5ML PO SUSP
500.0000 mg | Freq: Four times a day (QID) | ORAL | Status: DC | PRN
  Filled 2020-10-19: qty 5

## 2020-10-19 MED ORDER — SORBITOL 70 % SOLN
30.0000 mL | Status: DC | PRN
  Filled 2020-10-19: qty 30

## 2020-10-19 MED ORDER — LACTATED RINGERS IV SOLN: INTRAVENOUS | Status: AC

## 2020-10-19 MED ORDER — DOCUSATE SODIUM 283 MG RE ENEM
1.0000 | ENEMA | RECTAL | Status: DC | PRN
  Filled 2020-10-19: qty 1

## 2020-10-19 MED ORDER — HYDROXYZINE HCL 25 MG PO TABS
25.0000 mg | ORAL_TABLET | Freq: Three times a day (TID) | ORAL | Status: DC | PRN
  Administered 2020-10-19: 25 mg via ORAL
  Filled 2020-10-19: qty 1

## 2020-10-19 MED ORDER — DOCUSATE SODIUM 100 MG PO CAPS
100.0000 mg | ORAL_CAPSULE | Freq: Two times a day (BID) | ORAL | Status: DC
  Administered 2020-10-19 – 2020-10-21 (×4): 100 mg via ORAL
  Filled 2020-10-19 (×4): qty 1

## 2020-10-19 MED ORDER — ACETAMINOPHEN 325 MG PO TABS
650.0000 mg | ORAL_TABLET | Freq: Four times a day (QID) | ORAL | Status: DC | PRN
  Administered 2020-10-20 – 2020-10-26 (×20): 650 mg via ORAL
  Filled 2020-10-19 (×22): qty 2

## 2020-10-19 MED ORDER — HYDROCODONE-ACETAMINOPHEN 5-325 MG PO TABS
1.0000 | ORAL_TABLET | ORAL | Status: DC | PRN
  Administered 2020-10-19: 2 via ORAL
  Administered 2020-10-19 – 2020-10-20 (×2): 1 via ORAL
  Filled 2020-10-19: qty 2
  Filled 2020-10-19 (×2): qty 1

## 2020-10-19 MED ORDER — ZOLPIDEM TARTRATE 5 MG PO TABS: 5.0000 mg | ORAL_TABLET | Freq: Every evening | ORAL | Status: DC | PRN

## 2020-10-19 MED ORDER — ASPIRIN EC 81 MG PO TBEC
81.0000 mg | DELAYED_RELEASE_TABLET | Freq: Every day | ORAL | Status: DC
  Administered 2020-10-20 – 2020-10-26 (×7): 81 mg via ORAL
  Filled 2020-10-19 (×7): qty 1

## 2020-10-19 MED ORDER — ENOXAPARIN SODIUM 30 MG/0.3ML ~~LOC~~ SOLN
30.0000 mg | SUBCUTANEOUS | Status: DC
  Administered 2020-10-19 – 2020-10-25 (×7): 30 mg via SUBCUTANEOUS
  Filled 2020-10-19 (×7): qty 0.3

## 2020-10-19 MED ORDER — NEPRO/CARBSTEADY PO LIQD
237.0000 mL | Freq: Three times a day (TID) | ORAL | Status: DC | PRN
  Filled 2020-10-19: qty 237

## 2020-10-19 MED ORDER — BISACODYL 5 MG PO TBEC: 5.0000 mg | DELAYED_RELEASE_TABLET | Freq: Every day | ORAL | Status: DC | PRN

## 2020-10-19 MED ORDER — ACETAMINOPHEN 650 MG RE SUPP: 650.0000 mg | Freq: Four times a day (QID) | RECTAL | Status: DC | PRN

## 2020-10-19 MED ORDER — INSULIN ASPART 100 UNIT/ML ~~LOC~~ SOLN
0.0000 [IU] | Freq: Three times a day (TID) | SUBCUTANEOUS | Status: DC
  Administered 2020-10-20 – 2020-10-24 (×7): 1 [IU] via SUBCUTANEOUS
  Administered 2020-10-24: 2 [IU] via SUBCUTANEOUS
  Administered 2020-10-24 – 2020-10-26 (×3): 1 [IU] via SUBCUTANEOUS

## 2020-10-19 MED ORDER — HYDRALAZINE HCL 20 MG/ML IJ SOLN
5.0000 mg | INTRAMUSCULAR | Status: DC | PRN
  Administered 2020-10-20 – 2020-10-21 (×3): 5 mg via INTRAVENOUS
  Filled 2020-10-19 (×3): qty 1

## 2020-10-19 MED ORDER — ONDANSETRON HCL 4 MG PO TABS: 4.0000 mg | ORAL_TABLET | Freq: Four times a day (QID) | ORAL | Status: DC | PRN

## 2020-10-19 MED ORDER — CAMPHOR-MENTHOL 0.5-0.5 % EX LOTN
1.0000 "application " | TOPICAL_LOTION | Freq: Three times a day (TID) | CUTANEOUS | Status: DC | PRN
  Filled 2020-10-19: qty 222

## 2020-10-19 MED ORDER — ONDANSETRON HCL 4 MG/2ML IJ SOLN
4.0000 mg | Freq: Four times a day (QID) | INTRAMUSCULAR | Status: DC | PRN
  Administered 2020-10-19: 4 mg via INTRAVENOUS
  Filled 2020-10-19: qty 2

## 2020-10-19 NOTE — Evaluation (Signed)
Physical Therapy Evaluation Patient Details Name: [name] MRN: [id number] DOB: 04/19/1946 Today's Date: 10/19/2020   History of Present Illness  75 y.o. female presents to [facility] ED on 10/19/2020 after a fall. Pt found to have R bimalleolar ankle fracture. 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 PMH includes HTN; DM; CVA with resultant L hemiplegia; and macular degeneration.  Clinical Impression  Pt presents to PT with deficits in functional mobility, gait, balance, endurance, strength, power, tone. Pt requires significant physical assistance to perform all functional mobility tasks at this time. Pt with significant L weakness from prior CVA, as well as inability to bear weight through R foot/ankle at this time, resulting in a high falls risk. Pt also demonstrates poor sitting balance and mild lethargy initially, possibly due to the influence of pain medication as daughter and RN report the patient received pain medicine in ED. Pt will benefit from aggressive mobilization and acute PT services to improve mobility quality and reduce falls risk. PT recommends SNF placement at this time.    Follow Up Recommendations SNF;Supervision/Assistance - 24 hour    Equipment Recommendations  Other (comment) (mechanical lift)    Recommendations for Other Services       Precautions / Restrictions Precautions Precautions: Fall Required Braces or Orthoses: Splint/Cast Splint/Cast: R ankle Splint/Cast - Date Prophylactic Dressing Applied (if applicable): [id number] Restrictions Weight Bearing Restrictions: Yes RLE Weight Bearing: Non weight bearing      Mobility  Bed Mobility Overal bed mobility: Needs Assistance Bed Mobility: Supine to Sit;Sit to Supine     Supine to sit: Max assist Sit to supine: Total assist   General bed mobility comments: pt requires assistance to pull up and to elevate trunk, additional assist to pivot hips    Transfers Overall transfer level: Needs  assistance Equipment used: 1 person hand held assist Transfers: Sit to/from Stand Sit to Stand: Max assist (SPT assisting with lifting RLE off floor to maintain WB status)         General transfer comment: PT provides L knee block and BUE support, pt requires assist to power up into standing, SPT assists with RLE for WB purposes  Ambulation/Gait                Stairs            Wheelchair Mobility    Modified Rankin (Stroke Patients Only)       Balance Overall balance assessment: Needs assistance Sitting-balance support: Single extremity supported;Feet supported Sitting balance-Leahy Scale: Poor Sitting balance - Comments: modA initially, improves to minA. Pt responds to verbal cues to adjust balance Postural control: Left lateral lean Standing balance support: Bilateral upper extremity supported Standing balance-Leahy Scale: Poor Standing balance comment: mod-maxA                             Pertinent Vitals/Pain Pain Assessment: Faces Faces Pain Scale: Hurts even more Pain Location: R foot/ankle Pain Descriptors / Indicators: Grimacing Pain Intervention(s): Monitored during session    Home Living Family/patient expects to be discharged to:: Skilled nursing facility (per rehab coordinator pt does not have medical need for CIR) Living Arrangements: Children               Additional Comments: family present 24/7, 1 story house with 3 steps to enter, bilateral rails.    Prior Function Level of Independence: Needs assistance   Gait / Transfers Assistance Needed: pt is  able to independently transfer from bed to bedside commode and transport chair. Pt scoots around the house in transport chair.  ADL's / Homemaking Assistance Needed: Pt requires assistance for ADLs        Hand Dominance   Dominant Hand: Right    Extremity/Trunk Assessment   Upper Extremity Assessment Upper Extremity Assessment: LUE deficits/detail LUE Deficits /  Details: LUE in decorticate positioning at rest, pt is able to extend fingers actively [facility], pt extends elbow to ~-10 degrees, pt with ~100 degrees active shoulder flexion, shoulder external rotation limited at this time. Increased flexor tone    Lower Extremity Assessment Lower Extremity Assessment: LLE deficits/detail LLE Deficits / Details: pt with LLE extensor tone, able to actively DF to ~-10 degrees, passive DF to -5. Pt can perform SLR with LLE. PT notes increased adductor tone during mobility    Cervical / Trunk Assessment Cervical / Trunk Assessment: Kyphotic  Communication   Communication: No difficulties  Cognition Arousal/Alertness: Awake/alert Behavior During Therapy: WFL for tasks assessed/performed Overall Cognitive Status: Within Functional Limits for tasks assessed                                        General Comments General comments (skin integrity, edema, etc.): VSS on RA    Exercises     Assessment/Plan    PT Assessment Patient needs continued PT services  PT Problem List Decreased strength;Decreased activity tolerance;Decreased range of motion;Decreased mobility;Decreased balance;Decreased coordination;Decreased knowledge of use of DME;Decreased safety awareness;Decreased knowledge of precautions;Impaired tone;Pain       PT Treatment Interventions DME instruction;Functional mobility training;Therapeutic exercise;Balance training;Therapeutic activities;Neuromuscular re-education;Patient/family education;Wheelchair mobility training    PT Goals (Current goals can be found in the Care Plan section)  Acute Rehab PT Goals Patient Stated Goal: to go to rehab to improve strength and functional mobility PT Goal Formulation: With patient/family Time For Goal Achievement: 11/02/20 Potential to Achieve Goals: Fair    Frequency Min 3X/week   Barriers to discharge        Co-evaluation               AM-PAC PT "6 Clicks" Mobility   Outcome Measure Help needed turning from your back to your side while in a flat bed without using bedrails?: A Lot Help needed moving from lying on your back to sitting on the side of a flat bed without using bedrails?: A Lot Help needed moving to and from a bed to a chair (including a wheelchair)?: A Lot Help needed standing up from a chair using your arms (e.g., wheelchair or bedside chair)?: A Lot Help needed to walk in hospital room?: Total Help needed climbing 3-5 steps with a railing? : Total 6 Click Score: 10    End of Session   Activity Tolerance: Patient tolerated treatment well Patient left: in bed;with call bell/phone within reach;with bed alarm set;with family/visitor present Nurse Communication: Mobility status;Need for lift equipment PT Visit Diagnosis: Other abnormalities of gait and mobility (R26.89);Muscle weakness (generalized) (M62.81);Hemiplegia and hemiparesis;Pain Hemiplegia - Right/Left: Left Hemiplegia - dominant/non-dominant: Non-dominant Hemiplegia - caused by: Cerebral infarction Pain - Right/Left: Right Pain - part of body: Ankle and joints of foot    Time: [id number] PT Time Calculation (min) (ACUTE ONLY): 26 min   Charges:   PT Evaluation $PT Eval Moderate Complexity: [place],  PT, DPT Acute Rehabilitation Pager: [phone]   Zenaida Niece 10/19/2020, 5:51 PM

## 2020-10-19 NOTE — Progress Notes (Signed)
Inpatient Rehab Admissions Coordinator Note:   Per MD request, pt was screened for CIR candidacy by Shann Medal, PT, DPT.  At this time note pt admitted for observation with a bimalleolar fx following a fall. Ortho recommends non-operative management.  Hx of a CVA in 2019.  At this time, pt does not have the medical necessity to warrant a CIR admission, and [facility] Medicare would not approve a CIR stay.  Would recommend f/u at an alternative level of care per therapy recommendations.  Please contact me with questions.   Shann Medal, PT, DPT [phone] 10/19/20 3:16 PM

## 2020-10-19 NOTE — ED Notes (Signed)
Patient transported to X-ray 

## 2020-10-19 NOTE — Progress Notes (Signed)
Orthopedic Tech Progress Note Patient Details:  [name] [birth year] [id number]  Ortho Devices Type of Ortho Device: Stirrup splint,Post (short) splint Splint Material: Fiberglass Ortho Device/Splint Location: RLE Ortho Device/Splint Interventions: Ordered,Application,Adjustment   Post Interventions Patient Tolerated: Poor Instructions Provided: Care of device,Poper ambulation with device   [name] 10/19/2020, 12:44 PM

## 2020-10-19 NOTE — H&P (Signed)
History and Physical    [name] [id number] DOB: Oct 25, 1945 DOA: 10/19/2020  PCP: Orma Flaming, MD Consultants:  March Rummage - podiatry Patient coming from:  Home - lives with husband and daughter; Donald Prose: Daughter, [phone]  Chief Complaint: Fall  HPI: [name] is a 75 y.o. female with medical history significant of HTN; DM; CVA with resultant L hemiplegia; and macular degeneration presenting with a fall.  She was getting up to get a Tylenol and her ankle gave way.  She has not had ankle problems but has had back problems for the last few weeks.  It was very painful but they did not know it was broken.  She had a prior CVA and had minimal recovery with left-sided weakness.  She was afraid of falling and sits in her transport chair mostly.  She is able to transfer from chair to bed.  Her daughter is concerned that she might not be able to care for her.  Her daughter also thinks she may benefit from further rehab on the left.  The stroke was in 02/2018 and she went to CIR with reasonably good success but she fell and fractured her pelvis in 09/2018 and has not rehabbed much since.    ED Course:  Mechanical fall with R bimalleolar ankle fracture. Has L hemiplegia chronically, may need rehab admission.  Also with AKI.  Ortho consulted, PT/OT needed.    Review of Systems: As per HPI; otherwise review of systems reviewed and negative.   Ambulatory Status:  Generally uses a wheelchair  COVID Vaccine Status:  None  Past Medical History:  Diagnosis Date  . Allergy   . Arthritis   . Cataract   . Diabetes mellitus without complication ([place])   . Hypertension   . Macular degeneration syndrome    with edema---being treated.   . Stage 4 chronic kidney disease ([place])   . Stroke [facility]) 2019    Past Surgical History:  Procedure Laterality Date  . callous removal  Left 2017   located on 1st digit on L foot 2/2 diabetes  . EYE SURGERY      Social History   Socioeconomic History   . Marital status: Married    Spouse name: Rennis Harding  . Number of children: 2  . 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 Highest education level: High school graduate  Occupational History  . Not on file  Tobacco Use  . Smoking status: Never Smoker  . Smokeless tobacco: Never Used  Vaping Use  . Vaping Use: Never used  Substance and Sexual Activity  . Alcohol use: No  . Drug use: No  . Sexual activity: Not Currently  Other Topics Concern  . Not on file  Social History Narrative  . Not on file   Social Determinants of Health   Financial Resource Strain: Not on file  Food Insecurity: Not on file  Transportation Needs: Not on file  Physical Activity: Not on file  Stress: Not on file  Social Connections: Not on file  Intimate Partner Violence: Not on file    Allergies  Allergen Reactions  . Clonidine Anaphylaxis, Swelling and Other (See Comments)    Tongue swelling  . Norvasc [Amlodipine Besylate] Swelling    Edema and TONGUE swelling   . Lisinopril Cough  . Amlodipine Anxiety, Nausea Only and Swelling  . Codeine Itching and Rash    Family History  Problem Relation Age of Onset  . Kidney  disease Mother   . Congestive Heart Failure Father   . COPD Father   . COPD Brother   . Diabetes Maternal Aunt     Prior to Admission medications   Medication Sig Start Date End Date Taking? Authorizing Provider  aspirin EC 81 MG tablet Take 81 mg by mouth daily.    [provider]  blood glucose meter kit and supplies KIT Dispense based on patient and insurance preference. UAD bid for glucose monitoring Dx: E11.9 09/30/19   Pleas Koch, NP  Ferrous Gluconate (IRON 27 PO) Take by mouth.    [provider]  glucose blood test strip UAD TID for glucose monitoring Dx: E11.9 08/23/20   Orma Flaming, MD  losartan (COZAAR) 25 MG tablet TAKE 1 TABLET BY MOUTH TWICE A DAY 07/30/20   Dutch Quint B, FNP  metoprolol succinate  (TOPROL-XL) 25 MG 24 hr tablet Take 1 tablet (25 mg total) by mouth daily. 08/18/20   Orma Flaming, MD  OneTouch Delica Lancets 49Z MISC USE TO TEST TWICE DAILY 07/30/19   [provider]    Physical Exam: Vitals:   10/19/20 1123 10/19/20 1215 10/19/20 1330 10/19/20 1346  BP: (!) 179/79 (!) 162/57 (!) 156/82 (!) 150/91  Pulse: 74 72 78 77  Resp: $Remo'17 14 17 15  'QUvYk$ Temp:      TempSrc:      SpO2: 98% 96% 97% 97%  Weight:      Height:         . General:  Appears calm and mildly uncomfortable and is in NAD . Eyes:  PERRL, EOMI, normal lids, iris . ENT:  grossly normal hearing, lips & tongue, mmm . Neck:  no LAD, masses or thyromegaly . Cardiovascular:  RRR, no m/r/g. No LE edema.  Marland Kitchen Respiratory:   CTA bilaterally with no wheezes/rales/rhonchi.  Normal respiratory effort. . Abdomen:  soft, NT, ND . Skin:  no rash or induration seen on limited exam . Musculoskeletal:  Atrophy and contracture of LUE; RLE with splint in place . Psychiatric:  blunted mood and affect, speech fluent and appropriate, AOx3 . Neurologic:  CN 2-12 grossly intact, moves all extremities in coordinated fashion with relatively preserved strength despite apparent atrophy    Radiological Exams on Admission: Independently reviewed - see discussion in A/P where applicable  DG Ankle Complete Right  Result Date: 10/19/2020 CLINICAL DATA:  Fall, RIGHT ankle and knee pain EXAM: RIGHT ANKLE - COMPLETE 3+ VIEW COMPARISON:  None FINDINGS: Osseous demineralization. Diffuse soft tissue swelling RIGHT ankle extending into lower leg. Ankle joint space preserved. Mildly displaced fracture of the lateral malleolus. Mildly displaced fracture of the medial malleolus. Moderate-sized plantar calcaneal spur. No additional fracture, dislocation, or bone destruction. IMPRESSION: Mildly displaced bimalleolar fractures RIGHT ankle with associated soft tissue swelling. Electronically Signed   By: Lavonia Dana M.D.   On: 10/19/2020 10:13    DG Knee Complete 4 Views Right  Result Date: 10/19/2020 CLINICAL DATA:  RIGHT ankle RIGHT knee pain post fall EXAM: RIGHT KNEE - COMPLETE 4+ VIEW COMPARISON:  None FINDINGS: Osseous demineralization. Joint spaces preserved. No acute fracture, dislocation, or bone destruction. No joint effusion. Atherosclerotic calcifications of distal superficial femoral, popliteal, and runoff vessels. IMPRESSION: Osseous demineralization. No acute osseous abnormalities. Electronically Signed   By: Lavonia Dana M.D.   On: 10/19/2020 10:12   DG Hip Unilat With Pelvis 2-3 Views Right  Result Date: 10/19/2020 CLINICAL DATA:  Status post fall.  Right hip pain. EXAM: DG  HIP (WITH OR WITHOUT PELVIS) 2-3V RIGHT COMPARISON:  None. FINDINGS: There is no evidence of hip fracture or dislocation. There is no evidence of arthropathy or other focal bone abnormality. Vascular calcifications noted. IMPRESSION: Negative. Electronically Signed   By: Fidela Salisbury M.D.   On: 10/19/2020 11:12    EKG: not done   Labs on Admission: I have personally reviewed the available labs and imaging studies at the time of the admission.  Pertinent labs:   BUN 35/Creatinine 2.01/GFR 25; baseline creatinine 1.7-1.8, GFR 27-28 Normal CBC A1c 7.3 on 2/11   Assessment/Plan Principal Problem:   Bimalleolar ankle fracture, right, closed, initial encounter Active Problems:   Diabetes mellitus without complication (HCC)   HTN (hypertension)   Overweight (BMI 25.0-29.9)   Left hemiparesis (HCC)   Stage 4 chronic kidney disease ([place])   Bimalleolar ankle fracture -Mechanical fall resulting in ankle fracture -Orthopedics consulted, splint applied -This is complicated by her chronic underlying L hemiparesis -Pain control with Vicodin and Morphine prn -CIR consult for rehab placement -PT/OT consulted  H/o CVA with resultant L hemiparesis -Remote CVA with persistent L hemiparesis -She did have some success with CIR post-CVA but  then had a subsequent pelvic fracture and has been minimally ambulatory since -She has reasonable strength on the left with contracture of the arm and LUE atrophy and appears to have potential for improvement with CIR again -CIR consult as above -Continue ASA  HTN -Hold Cozaar and consider d/c -Continue Toprol -Consider additional agent if needed, would recommend SGLT2 inhibitor  DM -Recent A1c indicates suboptimal control -She is not on home medication -Cover with sensitive-scale SSI  Stage 4 CKD -Appears to be generally stable -Will give 1L IVF at 90 cc/hr -Hold Cozaar and consider d/c, as above -Consider addition of SGLT2 inhibitor    Level of care: Med-Surg DVT prophylaxis:   Lovenox  Code Status:  DNR - confirmed with patient/family Family Communication: Daughter was present throughout evaluation Disposition Plan:  CIR vs. SNF Consults called: Orthopedics; TOC team, PT/OT, Nutrition Admission status: It is my clinical opinion that referral for OBSERVATION is reasonable and necessary in this patient based on the above information provided. The aforementioned taken together are felt to place the patient at high risk for further clinical deterioration. However it is anticipated that the patient may be medically stable for discharge from the hospital within 24 to 48 hours.    Karmen Bongo MD [facility]   How to contact the [facility] Attending or Consulting provider [place] or covering provider during after hours [place], for this patient?  1. Check the care team in [facility] and look for a) attending/consulting [facility] provider listed and b) the [facility] team listed 2. Log into [email or web address] and use [place]'s universal password to access. If you do not have the password, please contact the hospital operator. 3. Locate the [facility] provider you are looking for under [facility] and page to a number that you can be directly reached. 4. If you still have difficulty reaching the  provider, please page the [facility] (Director on Call) for the Hospitalists listed on amion for assistance.   10/19/2020, 1:48 PM

## 2020-10-19 NOTE — Consult Note (Signed)
Reason for Consult:Right ankle fx Referring Physician: Calla Kicks Time called: 1208 Time at bedside: [name] is an 75 y.o. female.  HPI: [name] got up last night to get a Tylenol and ended up falling. She hurt her right ankle and could not bear weight. She was brought to the ED where x-rays showed a bimal ankle fx and orthopedic surgery was consulted. 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  Past Medical History:  Diagnosis Date  . Allergy   . Arthritis   . Cataract   . Diabetes mellitus without complication ([place])   . Hypertension   . Macular degeneration syndrome    with edema---being treated.     Past Surgical History:  Procedure Laterality Date  . callous removal  Left 2017   located on 1st digit on L foot 2/2 diabetes  . EYE SURGERY      Family History  Problem Relation Age of Onset  . Kidney disease Mother   . Congestive Heart Failure Father   . COPD Father   . COPD Brother   . Diabetes Maternal Aunt     Social History:  reports that she has never smoked. She has never used smokeless tobacco. She reports that she does not drink alcohol and does not use drugs.  Allergies:  Allergies  Allergen Reactions  . Clonidine Anaphylaxis, Swelling and Other (See Comments)    Tongue swelling  . Norvasc [Amlodipine Besylate] Swelling    Edema and TONGUE swelling   . Lisinopril Cough  . Amlodipine Anxiety, Nausea Only and Swelling  . Codeine Itching and Rash    Medications: I have reviewed the patient's current medications.  Results for orders placed or performed during the hospital encounter of 10/19/20 (from the past 48 hour(s))  CBC     Status: None   Collection Time: 10/19/20 10:36 AM  Result Value Ref Range   WBC 9.4 4.0 - 10.5 K/uL   RBC 4.52 3.87 - 5.11 MIL/uL   Hemoglobin 13.1 12.0 - 15.0 g/dL   HCT 40.2 36.0 - 46.0 %   MCV  88.9 80.0 - 100.0 fL   MCH 29.0 26.0 - 34.0 pg   MCHC 32.6 30.0 - 36.0 g/dL   RDW 13.1 11.5 - 15.5 %   Platelets 174 150 - 400 K/uL   nRBC 0.0 0.0 - 0.2 %    Comment: Performed at [facility], [place] [address], [place], [place] [id number]  Basic metabolic panel     Status: Abnormal   Collection Time: 10/19/20 10:36 AM  Result Value Ref Range   Sodium 137 135 - 145 mmol/L   Potassium 4.2 3.5 - 5.1 mmol/L   Chloride 112 (H) 98 - 111 mmol/L   CO2 15 (L) 22 - 32 mmol/L   Glucose, Bld 160 (H) 70 - 99 mg/dL    Comment: Glucose reference range applies only to samples taken after fasting for at least 8 hours.   BUN 36 (H) 8 - 23 mg/dL   Creatinine, Ser 2.01 (H) 0.44 - 1.00 mg/dL   Calcium 8.6 (L) 8.9 - 10.3 mg/dL   GFR, Estimated 25 (L) >60 mL/min    Comment: (NOTE) Calculated using the CKD-EPI Creatinine Equation (2021)    Anion gap 10 5 - 15    Comment: Performed at [facility] [place],  [place] [address]    DG Ankle Complete Right  Result Date: 10/19/2020 CLINICAL DATA:  Fall, RIGHT ankle and knee pain EXAM: RIGHT ANKLE - COMPLETE 3+ VIEW COMPARISON:  None FINDINGS: Osseous demineralization. Diffuse soft tissue swelling RIGHT ankle extending into lower leg. Ankle joint space preserved. Mildly displaced fracture of the lateral malleolus. Mildly displaced fracture of the medial malleolus. Moderate-sized plantar calcaneal spur. No additional fracture, dislocation, or bone destruction. IMPRESSION: Mildly displaced bimalleolar fractures RIGHT ankle with associated soft tissue swelling. Electronically Signed   By: Lavonia Dana M.D.   On: 10/19/2020 10:13   DG Knee Complete 4 Views Right  Result Date: 10/19/2020 CLINICAL DATA:  RIGHT ankle RIGHT knee pain post fall EXAM: RIGHT KNEE - COMPLETE 4+ VIEW COMPARISON:  None FINDINGS: Osseous demineralization. Joint spaces preserved. No acute fracture, dislocation, or bone destruction. No joint effusion. Atherosclerotic  calcifications of distal superficial femoral, popliteal, and runoff vessels. IMPRESSION: Osseous demineralization. No acute osseous abnormalities. Electronically Signed   By: Lavonia Dana M.D.   On: 10/19/2020 10:12   DG Hip Unilat With Pelvis 2-3 Views Right  Result Date: 10/19/2020 CLINICAL DATA:  Status post fall.  Right hip pain. EXAM: DG HIP (WITH OR WITHOUT PELVIS) 2-3V RIGHT COMPARISON:  None. FINDINGS: There is no evidence of hip fracture or dislocation. There is no evidence of arthropathy or other focal bone abnormality. Vascular calcifications noted. IMPRESSION: Negative. Electronically Signed   By: Fidela Salisbury M.D.   On: 10/19/2020 11:12    Review of Systems  HENT: Negative for ear discharge, ear pain, hearing loss and tinnitus.   Eyes: Negative for photophobia and pain.  Respiratory: Negative for cough and shortness of breath.   Cardiovascular: Negative for chest pain.  Gastrointestinal: Negative for abdominal pain, nausea and vomiting.  Genitourinary: Negative for dysuria, flank pain, frequency and urgency.  Musculoskeletal: Positive for arthralgias (Right ankle/knee,hip). Negative for back pain, myalgias and neck pain.  Neurological: Negative for dizziness and headaches.  Hematological: Does not bruise/bleed easily.  Psychiatric/Behavioral: The patient is not nervous/anxious.    Blood pressure (!) 162/57, pulse 72, temperature 98.8 F (37.1 C), temperature source Oral, resp. rate 14, height '5\' 2"'$  (1.575 m), weight 73 kg, SpO2 96 %. Physical Exam Constitutional:      General: She is not in acute distress.    Appearance: She is well-developed. She is not diaphoretic.  HENT:     Head: Normocephalic and atraumatic.  Eyes:     General: No scleral icterus.       Right eye: No discharge.        Left eye: No discharge.     Conjunctiva/sclera: Conjunctivae normal.  Cardiovascular:     Rate and Rhythm: Normal rate and regular rhythm.  Pulmonary:     Effort: Pulmonary  effort is normal. No respiratory distress.  Musculoskeletal:     Cervical back: Normal range of motion.     Comments: RLE No traumatic wounds, ecchymosis, or rash  Mod TTP ankle  No knee or ankle effusion  Knee stable to varus/ valgus and anterior/posterior stress  Sens DPN, SPN, TN intact  Motor EHL  5/5  Toes perfused, No significant edema  Skin:    General: Skin is warm and dry.  Neurological:     Mental Status: She is alert.  Psychiatric:        Behavior: Behavior normal.     Assessment/Plan: Right ankle fx -- Splint and NWB. Given at [facility] level now will likely need  SNF for next 6-8 weeks until she can start bearing weight again. F/u with Dr. Doran Durand in 2 weeks.    Lisette Abu, PA-C Orthopedic Surgery [phone] 10/19/2020, 12:44 PM

## 2020-10-19 NOTE — ED Triage Notes (Signed)
Pt BIB [facility] from home c/o of a fall last night. Pt states she was up trying to get some tylenol when her ankle gave way and she fell. Pt does have left sided deficits from a previous stroke 2 years ago. Pt is alert and oriented. Pt is c/o of right ankle pain. Right ankle is swollen and painful to touch. Pt rates the pain 7/10. Pt is now unable to bare weight on the right foot. Pt denies any blood thinners and denies hitting her head.

## 2020-10-19 NOTE — ED Provider Notes (Signed)
[facility] Provider Note   CSN: [id number] Arrival date & time: 10/19/20  0855     History Chief Complaint  Patient presents with  . Fall    [name] is a 75 y.o. female.  The history is provided by the patient, a relative and medical records. No language interpreter was used.  Fall     75 year old female significant history of prior stroke with left-sided deficit, diabetes, hypertension, macular degeneration, chronic kidney disease, obesity, brought here via EMS from home for evaluation of fall.  History obtained through patient and through daughter who is at bedside.  Patient has 2 prior strokes causing left-sided deficit.  She normally use a assist wheelchair for mobility.  Last night she was having some back pain, went to her dresser to get some Tylenol but then her right ankle gave out causing her to fall.  She denies any precipitating symptoms prior to the fall.  She report her ankle is usually weak.  She is now complaining of 7 out of 10 sharp throbbing pain about the right ankle with associated swelling.  She denies any head any loss of consciousness.  She is not on any blood thinner medication.  She denies any new numbness or new weakness.  Patient is able to provide adequate history.  Daughter at bedside also cooperate with the story.  Patient did take hydrocodone prior to arrival.  Past Medical History:  Diagnosis Date  . Allergy   . Arthritis   . Cataract   . Diabetes mellitus without complication ([place])   . Hypertension   . Macular degeneration syndrome    with edema---being treated.     Patient Active Problem List   Diagnosis Date Noted  . Cataracts, bilateral 05/03/2020  . Stage 4 chronic kidney disease ([place]) 12/21/2019  . Iron deficiency anemia 02/03/2019  . Pelvic fracture ([place]) 09/21/2018  . Allergic reaction caused by a drug 05/06/2018  . Bilateral leg edema 04/13/2018  . Embolic stroke ([place]) 76/80/8811  .  Left hemiparesis ([place])   . Diabetes mellitus type 2 in nonobese ([facility])   . Acute ischemic stroke ([place])   . Left arm weakness   . Overweight (BMI 25.0-29.9) 02/24/2018  . Diabetes mellitus without complication ([place]) 09/19/9456  . HTN (hypertension) 09/02/2017    Past Surgical History:  Procedure Laterality Date  . callous removal  Left 2017   located on 1st digit on L foot 2/2 diabetes  . EYE SURGERY       OB History   No obstetric history on file.     Family History  Problem Relation Age of Onset  . Kidney disease Mother   . Congestive Heart Failure Father   . COPD Father   . COPD Brother   . Diabetes Maternal Aunt     Social History   Tobacco Use  . Smoking status: Never Smoker  . Smokeless tobacco: Never Used  Vaping Use  . Vaping Use: Never used  Substance Use Topics  . Alcohol use: No  . Drug use: No    Home Medications Prior to Admission medications   Medication Sig Start Date End Date Taking? Authorizing Provider  aspirin EC 81 MG tablet Take 81 mg by mouth daily.    [provider]  blood glucose meter kit and supplies KIT Dispense based on patient and insurance preference. [facility] bid for glucose monitoring Dx: E11.9 09/30/19   Pleas Koch, NP  Ferrous Gluconate (IRON 27 PO)  Take by mouth.    [provider]  glucose blood test strip [facility] TID for glucose monitoring Dx: E11.9 08/23/20   Orma Flaming, MD  losartan (COZAAR) 25 MG tablet TAKE 1 TABLET BY MOUTH TWICE A DAY 07/30/20   Dutch Quint B, FNP  metoprolol succinate (TOPROL-XL) 25 MG 24 hr tablet Take 1 tablet (25 mg total) by mouth daily. 08/18/20   Orma Flaming, MD  OneTouch Delica Lancets 92J MISC USE TO TEST TWICE DAILY 07/30/19   [provider]    Allergies    Clonidine, Norvasc [amlodipine besylate], Lisinopril, Amlodipine, and Codeine  Review of Systems   Review of Systems  All other systems reviewed and are negative.   Physical Exam Updated Vital Signs BP  (!) 179/59 (BP Location: Right Arm)   Pulse 76   Resp 16   Ht $R'5\' 2"'[place]$  (1.575 m)   Wt 73 kg   SpO2 97%   BMI 29.45 kg/m   Physical Exam Vitals and nursing note reviewed.  Constitutional:      General: She is not in acute distress.    Appearance: She is well-developed. She is obese.  HENT:     Head: Atraumatic.  Eyes:     Conjunctiva/sclera: Conjunctivae normal.  Cardiovascular:     Rate and Rhythm: Normal rate and regular rhythm.     Pulses: Normal pulses.     Heart sounds: Normal heart sounds.  Pulmonary:     Effort: Pulmonary effort is normal.     Breath sounds: Normal breath sounds.  Abdominal:     Palpations: Abdomen is soft.  Musculoskeletal:        General: Tenderness (Right knee: Tenderness to medial joint line and decreased knee flexion secondary to pain but no deformity noted.) and signs of injury (Right ankle: Tenderness to both medial malleolus and lateral malleolus with moderate swelling noted to lateral malleoli region with decreased ankle range of motion.  Intact pedal pulse.  No pain at fifth metatarsal.) present.     Cervical back: Neck supple.     Comments: No significant midline spine tenderness on exam.  Skin:    Findings: No rash.  Neurological:     Mental Status: She is alert and oriented to person, place, and time.     Comments: Left-sided weakness, chronic secondary to prior stroke.  Normal right grip strength.  Psychiatric:        Mood and Affect: Mood normal.     ED Results / Procedures / Treatments   Labs (all labs ordered are listed, but only abnormal results are displayed) Labs Reviewed  BASIC METABOLIC PANEL - Abnormal; Notable for the following components:      Result Value   Chloride 112 (*)    CO2 15 (*)    Glucose, Bld 160 (*)    BUN 36 (*)    Creatinine, Ser 2.01 (*)    Calcium 8.6 (*)    GFR, Estimated 25 (*)    All other components within normal limits  SARS CORONAVIRUS 2 (TAT 6-24 HRS)  CBC    EKG None  Radiology DG  Ankle Complete Right  Result Date: 10/19/2020 CLINICAL DATA:  Fall, RIGHT ankle and knee pain EXAM: RIGHT ANKLE - COMPLETE 3+ VIEW COMPARISON:  None FINDINGS: Osseous demineralization. Diffuse soft tissue swelling RIGHT ankle extending into lower leg. Ankle joint space preserved. Mildly displaced fracture of the lateral malleolus. Mildly displaced fracture of the medial malleolus. Moderate-sized plantar calcaneal spur. No additional fracture, dislocation,  or bone destruction. IMPRESSION: Mildly displaced bimalleolar fractures RIGHT ankle with associated soft tissue swelling. Electronically Signed   By: Lavonia Dana M.D.   On: 10/19/2020 10:13   DG Knee Complete 4 Views Right  Result Date: 10/19/2020 CLINICAL DATA:  RIGHT ankle RIGHT knee pain post fall EXAM: RIGHT KNEE - COMPLETE 4+ VIEW COMPARISON:  None FINDINGS: Osseous demineralization. Joint spaces preserved. No acute fracture, dislocation, or bone destruction. No joint effusion. Atherosclerotic calcifications of distal superficial femoral, popliteal, and runoff vessels. IMPRESSION: Osseous demineralization. No acute osseous abnormalities. Electronically Signed   By: Lavonia Dana M.D.   On: 10/19/2020 10:12   DG Hip Unilat With Pelvis 2-3 Views Right  Result Date: 10/19/2020 CLINICAL DATA:  Status post fall.  Right hip pain. EXAM: DG HIP (WITH OR WITHOUT PELVIS) 2-3V RIGHT COMPARISON:  None. FINDINGS: There is no evidence of hip fracture or dislocation. There is no evidence of arthropathy or other focal bone abnormality. Vascular calcifications noted. IMPRESSION: Negative. Electronically Signed   By: Fidela Salisbury M.D.   On: 10/19/2020 11:12    Procedures Procedures   Medications Ordered in ED Medications  aspirin EC tablet 81 mg (has no administration in time range)  metoprolol succinate (TOPROL-XL) 24 hr tablet 25 mg (has no administration in time range)  acetaminophen (TYLENOL) tablet 650 mg (has no administration in time range)     Or  acetaminophen (TYLENOL) suppository 650 mg (has no administration in time range)  zolpidem (AMBIEN) tablet 5 mg (has no administration in time range)  sorbitol 70 % solution 30 mL (has no administration in time range)  docusate sodium (ENEMEEZ) enema 283 mg (has no administration in time range)  ondansetron (ZOFRAN) tablet 4 mg (has no administration in time range)    Or  ondansetron (ZOFRAN) injection 4 mg (has no administration in time range)  camphor-menthol (SARNA) lotion 1 application (has no administration in time range)    And  hydrOXYzine (ATARAX/VISTARIL) tablet 25 mg (has no administration in time range)  calcium carbonate (dosed in mg elemental calcium) suspension 500 mg of elemental calcium (has no administration in time range)  feeding supplement (NEPRO CARB STEADY) liquid 237 mL (has no administration in time range)  lactated ringers infusion (has no administration in time range)  HYDROcodone-acetaminophen (NORCO/VICODIN) 5-325 MG per tablet 1-2 tablet (has no administration in time range)  morphine 2 MG/ML injection 2 mg (has no administration in time range)  docusate sodium (COLACE) capsule 100 mg (has no administration in time range)  polyethylene glycol (MIRALAX / GLYCOLAX) packet 17 g (has no administration in time range)  bisacodyl (DULCOLAX) EC tablet 5 mg (has no administration in time range)  hydrALAZINE (APRESOLINE) injection 5 mg (has no administration in time range)  insulin aspart (novoLOG) injection 0-9 Units (has no administration in time range)  enoxaparin (LOVENOX) injection 30 mg (has no administration in time range)  HYDROcodone-acetaminophen (NORCO/VICODIN) 5-325 MG per tablet 1 tablet (1 tablet Oral Given 10/19/20 1121)    ED Course  I have reviewed the triage vital signs and the nursing notes.  Pertinent labs & imaging results that were available during my care of the patient were reviewed by me and considered in my medical decision making (see chart  for details).    MDM Rules/Calculators/A&P                          BP (!) 150/91 (BP Location: Right Arm)   Pulse  77   Temp 98.8 F (37.1 C) (Oral)   Resp 15   Ht $R'5\' 2"'Mi$  (1.575 m)   Wt 73 kg   SpO2 97%   BMI 29.45 kg/m   Final Clinical Impression(s) / ED Diagnoses Final diagnoses:  Fall, initial encounter  Closed bimalleolar fracture of right ankle, initial encounter  AKI (acute kidney injury) ([place])  H/O stroke without residual deficits    Rx / DC Orders ED Discharge Orders    None     9:18 AM Patient report her right ankle gave out on her when she fell earlier this morning while she was going to get some Tylenol for her back pain.  Back pain is not new.  No new focal neuro deficit.  Ankle injury likely mechanical.  She does have tenderness to her right knee and right ankle.  X-rays ordered.  Pain medication given.  No significant midline spine tenderness on exam.  Low suspicion for cauda equina.  10:22 AM X-ray of right ankle demonstrate mildly displaced bimalleolar fracture right ankle with associated soft tissue swelling.  This is a closed injury.  Patient is neurovascularly intact.  Will apply a posterior and stirrup ankle splint for stability and support.  Patient will need to be nonweightbearing.  Will provide pain medication as well.  Orthopedic referral given.  Care discussed with Dr. Vallery Ridge  1:56 PM Since pt was unable to bear weight or stands up due to her ankle injury, we have consulted [facility], Dr. Lorin Mercy who agrees to admit pt for further care. Orthopedic will also involve in her care.      Domenic Moras, PA-C 10/19/20 1357    Charlesetta Shanks, MD 11/07/20 [phone]

## 2020-10-20 DIAGNOSIS — S82841A Displaced bimalleolar fracture of right lower leg, initial encounter for closed fracture: Secondary | ICD-10-CM | POA: Diagnosis not present

## 2020-10-20 LAB — URINALYSIS, ROUTINE W REFLEX MICROSCOPIC
Bilirubin Urine: NEGATIVE
Glucose, UA: 50 mg/dL — AB
Ketones, ur: NEGATIVE mg/dL
Leukocytes,Ua: NEGATIVE
Nitrite: NEGATIVE
Protein, ur: 100 mg/dL — AB
Specific Gravity, Urine: 1.011 (ref 1.005–1.030)
pH: 5 (ref 5.0–8.0)

## 2020-10-20 LAB — BASIC METABOLIC PANEL
Anion gap: 4 — ABNORMAL LOW (ref 5–15)
BUN: 35 mg/dL — ABNORMAL HIGH (ref 8–23)
CO2: 21 mmol/L — ABNORMAL LOW (ref 22–32)
Calcium: 8.6 mg/dL — ABNORMAL LOW (ref 8.9–10.3)
Chloride: 110 mmol/L (ref 98–111)
Creatinine, Ser: 2.23 mg/dL — ABNORMAL HIGH (ref 0.44–1.00)
GFR, Estimated: 22 mL/min — ABNORMAL LOW (ref >60)
Glucose, Bld: 146 mg/dL — ABNORMAL HIGH (ref 70–99)
Potassium: 3.9 mmol/L (ref 3.5–5.1)
Sodium: 135 mmol/L (ref 135–145)

## 2020-10-20 LAB — CBC
HCT: 34 % — ABNORMAL LOW (ref 36.0–46.0)
Hemoglobin: 11 g/dL — ABNORMAL LOW (ref 12.0–15.0)
MCH: 28.2 pg (ref 26.0–34.0)
MCHC: 32.4 g/dL (ref 30.0–36.0)
MCV: 87.2 fL (ref 80.0–100.0)
Platelets: 266 10*3/uL (ref 150–400)
RBC: 3.9 MIL/uL (ref 3.87–5.11)
RDW: 13.3 % (ref 11.5–15.5)
WBC: 9.3 10*3/uL (ref 4.0–10.5)
nRBC: 0 % (ref 0.0–0.2)

## 2020-10-20 LAB — GLUCOSE, CAPILLARY
Glucose-Capillary: 132 mg/dL — ABNORMAL HIGH (ref 70–99)
Glucose-Capillary: 126 mg/dL — ABNORMAL HIGH (ref 70–99)
Glucose-Capillary: 124 mg/dL — ABNORMAL HIGH (ref 70–99)

## 2020-10-20 MED ORDER — LACTATED RINGERS IV SOLN: INTRAVENOUS | Status: AC

## 2020-10-20 MED ORDER — ADULT MULTIVITAMIN W/MINERALS CH
1.0000 | ORAL_TABLET | Freq: Every day | ORAL | Status: DC
  Administered 2020-10-20 – 2020-10-23 (×4): 1 via ORAL
  Filled 2020-10-20 (×4): qty 1

## 2020-10-20 MED ORDER — METHOCARBAMOL 500 MG PO TABS
500.0000 mg | ORAL_TABLET | Freq: Three times a day (TID) | ORAL | Status: AC | PRN
Start: 2020-10-20 — End: 2020-10-21
  Administered 2020-10-20 – 2020-10-21 (×2): 500 mg via ORAL
  Filled 2020-10-20 (×2): qty 1

## 2020-10-20 NOTE — Plan of Care (Signed)
  Problem: Coping: Goal: Level of anxiety will decrease Outcome: Progressing   Problem: Pain Managment: Goal: General experience of comfort will improve Outcome: Progressing   Problem: Safety: Goal: Ability to remain free from injury will improve Outcome: Progressing   Problem: Skin Integrity: Goal: Risk for impaired skin integrity will decrease Outcome: Progressing   

## 2020-10-20 NOTE — Evaluation (Signed)
Occupational Therapy Evaluation Patient Details Name: [name] MRN: [id number] DOB: Nov 26, 1945 Today's Date: 10/20/2020    History of Present Illness 75 y.o. female presents to [facility] ED on 10/19/2020 after a fall. Pt found to have R bimalleolar ankle fracture. 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 PMH includes HTN; DM; CVA with resultant L hemiplegia; and macular degeneration.   Clinical Impression   Pt admitted to the ED due to the injury listed above. PTA pt was transferring with independence to WC/BSC/bed and requiring minimal assistance to complete ADL's. At the time of the evaluation, pt was very lethargic, with some cognitive decline, most likely due to multiple pain medications she received through the night. Due to lethargy and weakness, pt required max assist for all mobility, including sitting balance. Pt had a previous stroke 2 years ago resulting in L sided weakness, daughter reported that pt had recovered from that requiring only intermittent supervision for safety. At this time L sided weakness is heightened, most likely due to lethargy. Pt and daughter were educated on importance of mobility and following NWB precautions at this time. Pt will benefit from acute OT to continue working towards better mobility with transfers, while following her precautions.      Follow Up Recommendations  Home health OT (Pt and family perfer home over SNF as long as pt can tolerate assisting with transfers.)    Equipment Recommendations  None recommended by OT    Recommendations for Other Services       Precautions / Restrictions Precautions Precautions: Fall;Other (comment) Precaution Comments: R ankle fracture Required Braces or Orthoses: Splint/Cast Splint/Cast: R ankle Splint/Cast - Date Prophylactic Dressing Applied (if applicable): [id number]      Mobility Bed Mobility Overal bed mobility: Needs Assistance Bed Mobility: Rolling;Sidelying to Sit;Sit to Supine Rolling: Mod  assist Sidelying to sit: Max assist   Sit to supine: Total assist   General bed mobility comments: Pt requires verbal cueing throughout bed mobility for sequencing. Due to pain and lethargy, pt requires max + assist with all bed mobility.    Transfers Overall transfer level: Needs assistance Equipment used: 1 person hand held assist Transfers: Sit to/from Stand Sit to Stand: Max assist         General transfer comment: Pt was able to assist with bringing bottom 3-4 inches off the bed x 2 before needing to sit back on the EOB. Pt having difficulty bearing all weight through LLE.    Balance Overall balance assessment: Needs assistance Sitting-balance support: Single extremity supported;Feet supported Sitting balance-Leahy Scale: Poor Sitting balance - Comments: Max A with intermittent min guard, with verbal cueing pt able to adjust her balance. Postural control: Posterior lean;Left lateral lean     Standing balance comment: Pt unable to come to a full stand with Max A                           ADL either performed or assessed with clinical judgement   ADL Overall ADL's : Needs assistance/impaired Eating/Feeding: Minimal assistance Eating/Feeding Details (indicate cue type and reason): Pt requires assistance bringing food up to mouth, most likely due to lethargy from meds. Pt is not typically limited physically to being able to feed herself. Grooming: Wash/dry face;Min guard;Sitting Grooming Details (indicate cue type and reason): Pt able to wash her face sitting EOB with support from OT stabilizing trunk in midline. Upper Body Bathing: Moderate assistance;Cueing for sequencing;Sitting Upper Body  Bathing Details (indicate cue type and reason): Pt is able to bath her upper body with sitting support from OT and cueing for sequencing and maintaining posture. Lower Body Bathing: Maximal assistance;Sitting/lateral leans Lower Body Bathing Details (indicate cue type and  reason): Pt limited with Lower body bathing at this time due to pain and lethargy. Upper Body Dressing : Min guard;Sitting Upper Body Dressing Details (indicate cue type and reason): Pt requires trunk support when dressing and increased time and cueing. Lower Body Dressing: Sitting/lateral leans;Total assistance Lower Body Dressing Details (indicate cue type and reason): Pt requires total assist at this time due to cognitive limitations and physical/pain limitations.             Functional mobility during ADLs: Maximal assistance;+2 for physical assistance;+2 for safety/equipment General ADL Comments: Pt requires max assist with intermittent min guard to sit EOB. Due to lethargy, most likely from multiple pain medications, pt requires verbal cueing and assistance to maintain midline seated at this time. Due to pain and weakness, pt is unable to power up to standing, while maintaining NWB precaution on RLE.     Vision Baseline Vision/History: No visual deficits Patient Visual Report: No change from baseline Vision Assessment?: No apparent visual deficits     Perception Perception Perception Tested?: No   Praxis Praxis Praxis tested?: Not tested    Pertinent Vitals/Pain Pain Assessment: 0-10 Pain Score: 10-Worst pain ever Faces Pain Scale: Hurts whole lot Pain Location: R foot/ankle Pain Descriptors / Indicators: Crying;Discomfort;Grimacing;Guarding;Spasm;Sharp;Restless Pain Intervention(s): Monitored during session;Premedicated before session;Repositioned     Hand Dominance Right   Extremity/Trunk Assessment Upper Extremity Assessment Upper Extremity Assessment: Generalized weakness;LUE deficits/detail LUE Deficits / Details: LUE in decorticate positioning at rest, pt is able to extend fingers actively [facility], pt extends elbow to ~-10 degrees, pt with ~100 degrees active shoulder flexion, shoulder external rotation limited at this time. Increased flexor tone LUE Sensation:  decreased light touch LUE Coordination: decreased fine motor;decreased gross motor   Lower Extremity Assessment Lower Extremity Assessment: Defer to PT evaluation   Cervical / Trunk Assessment Cervical / Trunk Assessment: Normal   Communication Communication Communication: No difficulties;Other (comment) (Pt very lethargic due to multiple pain medications, limiting cognitive and physical abilities.)   Cognition Arousal/Alertness: Awake/alert Behavior During Therapy: WFL for tasks assessed/performed Overall Cognitive Status: Impaired/Different from baseline Area of Impairment: Attention;Following commands;Safety/judgement                   Current Attention Level: Selective   Following Commands: Follows one step commands with increased time Safety/Judgement: Decreased awareness of safety     General Comments: Pt cognition limited due to being given multiple pain medications through the night. Daughter reports that typically pt is very bright and talkative, with no cognitive deficits.   General Comments  VSS on RA, Pt very groggy/lethargic most likely due to multiple pain medications. Pt would fall back to sleep quickly during rest breaks, vocally pt maintained low volume, and pt continued to report that she should not have been given morphine, "it messes me up".    Exercises     Shoulder Instructions      Home Living Family/patient expects to be discharged to:: Private residence Living Arrangements: Children Available Help at Discharge: Family;Available 24 hours/day Type of Home: House Home Access: Stairs to enter [employer] of Steps: 3 steps Entrance Stairs-Rails: Can reach both Home Layout: One level     Bathroom Shower/Tub: Teacher, early years/pre: Standard Bathroom  Accessibility: Yes How Accessible: Accessible via walker Home Equipment: Harvey - 2 wheels;Cane - single point;Bedside commode;Shower seat;Grab bars - toilet;Grab bars -  tub/shower;Hand held shower head;Wheelchair - manual          Prior Functioning/Environment Level of Independence: Needs assistance  Gait / Transfers Assistance Needed: pt is able to independently transfer from bed to bedside commode and transport chair. Pt scoots around the house in transport chair. ADL's / Homemaking Assistance Needed: Pt requires assistance for ADLs            OT Problem List: Decreased strength;Decreased range of motion;Decreased activity tolerance;Impaired balance (sitting and/or standing);Decreased coordination;Decreased cognition;Decreased safety awareness;Decreased knowledge of use of DME or AE;Decreased knowledge of precautions;Pain      OT Treatment/Interventions: Self-care/ADL training;Therapeutic exercise;Energy conservation;DME and/or AE instruction;Therapeutic activities;Cognitive remediation/compensation;Patient/family education;Balance training    OT Goals(Current goals can be found in the care plan section) Acute Rehab OT Goals Patient Stated Goal: To not feel so messed up and to go home. OT Goal Formulation: With patient/family Time For Goal Achievement: 11/03/20 Potential to Achieve Goals: Good ADL Goals Pt Will Perform Grooming: Independently;sitting Pt Will Perform Lower Body Bathing: with min guard assist;with adaptive equipment;sitting/lateral leans Pt Will Perform Lower Body Dressing: with supervision;sitting/lateral leans;with adaptive equipment Pt Will Transfer to Toilet: with mod assist;bedside commode Pt Will Perform Tub/Shower Transfer: Tub transfer;with mod assist;Stand pivot transfer;tub bench  OT Frequency: Min 2X/week   Barriers to D/C:            Co-evaluation              AM-PAC OT "6 Clicks" Daily Activity     Outcome Measure Help from another person eating meals?: A Little Help from another person taking care of personal grooming?: A Little Help from another person toileting, which includes using toliet, bedpan, or  urinal?: Total Help from another person bathing (including washing, rinsing, drying)?: A Lot Help from another person to put on and taking off regular upper body clothing?: A Little Help from another person to put on and taking off regular lower body clothing?: Total 6 Click Score: 13   End of Session Equipment Utilized During Treatment: Gait belt Nurse Communication: Mobility status  Activity Tolerance: Patient limited by lethargy;Treatment limited secondary to medical complications (Comment) (pt was given multiple pain medications through the night most likely causing her lethargy, cognitive deficits, and increased physical weakness.) Patient left: in bed;with call bell/phone within reach;with family/visitor present  OT Visit Diagnosis: Unsteadiness on feet (R26.81);Muscle weakness (generalized) (M62.81);Pain Pain - Right/Left: Right Pain - part of body: Ankle and joints of foot                Time: 0912-1029 OT Time Calculation (min): 77 min Charges:  OT General Charges $OT Visit: 1 Visit OT Evaluation $OT Eval Moderate Complexity: 1 Mod OT Treatments $Self Care/Home Management : 53-67 mins  Crystall Donaldson H., OTR/L Acute Rehabilitation  Caeleb Batalla Elane Yolanda Bonine 10/20/2020, 12:23 PM

## 2020-10-20 NOTE — Progress Notes (Signed)
Initial Nutrition Assessment  DOCUMENTATION CODES:   Not applicable  INTERVENTION:   Liberalized diet to REGULAR   Magic cup TID with meals, each supplement provides 290 kcal and 9 grams of protein  MVI daily   NUTRITION DIAGNOSIS:   Inadequate oral intake related to lethargy/confusion as evidenced by per patient/family report.  GOAL:   Patient will meet greater than or equal to 90% of their needs  MONITOR:   PO intake,Supplement acceptance,Weight trends,Labs,I & O's  REASON FOR ASSESSMENT:   Consult Assessment of nutrition requirement/status  ASSESSMENT:   75 yo female with a PMH of HTN, T2DM, CKD stage 4, CVA with resulting L hemiplegia, and macular degeneration who presents with a miballeolar R ankle fracture, closed.   Obtained history from daughter at bedside. Denies patient had decreased intake PTA. States she typically eats three meals with snacks. Appetite this admission has been slow to progress given mental status from pain medication (per daughter). No meal completions charted. RD observed breakfast tray with 25% completion. Discussed the importance of protein intake for preservation of lean body mass. Willing to try [employer].    Daughter endorses UBW of 160 lb and denies weight loss. Records indicate patient has maintained weight over the last year.   Drips: LR @ 75 ml/hr  Medications: colace, SS novolog Labs: Cr 2.23- up from yesterday   NUTRITION - FOCUSED PHYSICAL EXAM:  Flowsheet Row Most Recent Value  Orbital Region No depletion  Upper Arm Region Mild depletion  Thoracic and Lumbar Region Unable to assess  Buccal Region No depletion  Temple Region No depletion  Clavicle Bone Region No depletion  Clavicle and Acromion Bone Region Mild depletion  Scapular Bone Region Unable to assess  Dorsal Hand No depletion  Patellar Region No depletion  Anterior Thigh Region No depletion  Posterior Calf Region No depletion  Edema (RD Assessment) Mild   Hair Reviewed  Eyes Unable to assess  Mouth Unable to assess  Skin Reviewed  Nails Reviewed     Diet Order:   Diet Order            Diet Carb Modified Fluid consistency: Thin; Room service appropriate? Yes  Diet effective now                 EDUCATION NEEDS:   Not appropriate for education at this time  Skin:  Skin Assessment: Reviewed RN Assessment  Last BM:  unknown/PTA  Height:   Ht Readings from Last 1 Encounters:  10/19/20 '5\' 2"'$  (1.575 m)    Weight:   Wt Readings from Last 1 Encounters:  10/19/20 73 kg    BMI:  Body mass index is 29.45 kg/m.  Estimated Nutritional Needs:   Kcal:  1700-1900 kcal  Protein:  85-100 grams  Fluid:  >/= 1.7 L/day  Mariana Single RD, LDN Clinical Nutrition Pager listed in [place]

## 2020-10-20 NOTE — Plan of Care (Signed)
  Problem: Activity: Goal: Ability to increase mobility will improve Outcome: Progressing   Problem: Pain Management: Goal: Pain level will decrease with appropriate interventions Outcome: Progressing

## 2020-10-20 NOTE — TOC Initial Note (Signed)
Transition of Care [facility]) - Initial/Assessment Note    Patient Details  Name: ADALEEN SARDUY MRN: [id number] Date of Birth: Feb 24, 1946  Transition of [facility] CM/SW Contact:    Milinda Antis, [place] Phone Number: 10/20/2020, 3:02 PM  Clinical Narrative:                 CSW received consult for [facility] or SNF placement for patient.  CSW spoke with the patient's daughter at bedside.  The family is undecided on [facility] or SNF, but leaning towards SNF.  They would like to take the patient home once the patient is able to transfer safely.    CSW presented medicare choice list for both SNF and HH for the family to review.  This patient is also Observation.  Patient was sedated and unable to consent so MOON form could not be signed.  SW explained MOON information to the family.  CSW noted in handoff for weekend CM/CSW to follow up when patient is alert and oriented to complete MOON form if patient is still Observation.    Expected Discharge Plan: [place] Barriers to Discharge: Continued Medical Work up   Patient Goals and CMS Choice   CMS Medicare.gov Compare Post Acute Care list provided to:: Other (Comment Required) (Patient's adult daughter) Choice offered to / list presented to : Adult Children  Expected Discharge Plan and Services Expected Discharge Plan: [employer]       Living arrangements for the past 2 months: Single Family Home                                      Prior Living Arrangements/Services Living arrangements for the past 2 months: Single Family Home   Patient language and need for interpreter reviewed:: Yes        Need for Family Participation in Patient Care: Yes (Comment) Care giver support system in place?: Yes (comment)   Criminal Activity/Legal Involvement Pertinent to Current Situation/Hospitalization: No - Comment as needed  Activities of Daily Living Home Assistive Devices/Equipment: Walker (specify  type),Bedside commode/3-in-1 ADL Screening (condition at time of admission) Patient's cognitive ability adequate to safely complete daily activities?: Yes Is the patient deaf or have difficulty hearing?: No Does the patient have difficulty seeing, even when wearing glasses/contacts?: No Does the patient have difficulty concentrating, remembering, or making decisions?: No Patient able to express need for assistance with ADLs?: Yes Does the patient have difficulty dressing or bathing?: Yes Independently performs ADLs?: No Communication: Independent Dressing (OT): Needs assistance Is this a change from baseline?: Pre-admission baseline Grooming: Needs assistance Is this a change from baseline?: Pre-admission baseline Feeding: Independent Bathing: Needs assistance Is this a change from baseline?: Pre-admission baseline Toileting: Needs assistance Is this a change from baseline?: Pre-admission baseline In/Out Bed: Needs assistance Is this a change from baseline?: Pre-admission baseline Walks in Home: Needs assistance Is this a change from baseline?: Pre-admission baseline Does the patient have difficulty walking or climbing stairs?: Yes Weakness of Legs: Both Weakness of Arms/Hands: Both  Permission Sought/Granted                  Emotional Assessment Appearance:: Appears stated age Attitude/Demeanor/Rapport: Sedated Affect (typically observed): Unable to Assess   Alcohol / Substance Use: Not Applicable Psych Involvement: No (comment)  Admission diagnosis:  AKI (acute kidney injury) ([place]) [N17.9] H/O stroke without residual deficits [  Z16.73] Fall, initial encounter [id number]] Closed bimalleolar fracture of right ankle, initial encounter [S82.841A] Bimalleolar ankle fracture, right, closed, initial encounter [id number] Patient Active Problem List   Diagnosis Date Noted  . Bimalleolar ankle fracture, right, closed, initial encounter 10/19/2020  . Cataracts, bilateral  05/03/2020  . Stage 4 chronic kidney disease ([place]) 12/21/2019  . Iron deficiency anemia 02/03/2019  . Pelvic fracture ([place]) 09/21/2018  . Allergic reaction caused by a drug 05/06/2018  . Bilateral leg edema 04/13/2018  . Embolic stroke ([place]) [id number]  . Left hemiparesis ([place])   . Diabetes mellitus type 2 in nonobese ([facility])   . Acute ischemic stroke ([place])   . Left arm weakness   . Overweight (BMI 25.0-29.9) 02/24/2018  . Diabetes mellitus without complication ([place]) 0000000  . HTN (hypertension) 09/02/2017   PCP:  Orma Flaming, MD Pharmacy:   [id number] [place] [id number]: [phone]: [phone]    Social Determinants of Health (SDOH) Interventions    Readmission Risk Interventions No flowsheet data found.

## 2020-10-20 NOTE — Progress Notes (Signed)
PROGRESS NOTE    [name]  [id number] DOB: April 07, 1946 DOA: 10/19/2020 PCP: Orma Flaming, MD    Chief Complaint  Patient presents with  . Fall    Brief Narrative:  [name] is a 75 y.o. female with medical history significant of HTN; DM; CVA with resultant L hemiplegia; and macular degeneration presenting with a fall.  She was getting up to get a Tylenol and her ankle gave way.   Found to have R bimalleolar ankle fracture and AKI She is from home and was able to transfer from chair to bed prior this injury  Subjective:  She is sedated, she open her eyes briefly and able to say a few words before returning [name] to sleep. She knows it is 2022, confused about the month Daughter at bedside, think he drowsiness is from IV morphine and request iv morphine to be put on allergy list Daughter reported patient can tolerate low-dose hydrocodone  Assessment & Plan:   Principal Problem:   Bimalleolar ankle fracture, right, closed, initial encounter Active Problems:   Diabetes mellitus without complication ([place])   HTN (hypertension)   Overweight (BMI 25.0-29.9)   Left hemiparesis (HCC)   Stage 4 chronic kidney disease ([place])   Bimalleolar ankle fracture from mechanical fall - Ortho evaluated patient and recommended conservative management, splint put on in the ED, nonweightbearing left lower extremity, follow-up with Dr. Doran Durand in 2 weeks -DC IV morphine, keep low-dose hydrocodone, continue Tylenol as needed for pain control, on stool softener as well  AKI on CKDIV -Creatinine baseline range from 1.7-1.8 -Creatinine 2.23 today -Daughter report poor oral intake, will start hydration, hold Cozaar for now, check UA  Hypertension -On Cozaar at home, held due to AKI -Restarted on metoprolol  Diet controlled diabetes -A1c 7.3 -Fasting blood glucose 146 -Needing control in a 75 year old frail elderly  -Currently on SSI in hospital  History of CVA with left  hemiparesis -She is on aspirin at home, does not appear to be on statin  Currently patient is very lethargic, family think this is from IV morphine, if no improvement will check CT of the head  Nutritional Assessment: The patient's BMI is: Body mass index is 29.45 kg/m.Marland Kitchen Seen by dietician.  I agree with the assessment and plan as outlined below: Nutrition Status: Nutrition Problem: Inadequate oral intake Etiology: lethargy/confusion Signs/Symptoms: per patient/family report Interventions: Magic cup  . Skin Assessment: I have examined the patient's skin and I agree with the wound assessment as performed by the wound care RN as outlined below:  Pressure Injury Stage I -  Intact skin with non-blanchable redness of a localized area usually over a bony prominence. (Active)     Location: Heel  Location Orientation: Right;Left;Lateral;Medial;Bilateral;Posterior  Staging: Stage I -  Intact skin with non-blanchable redness of a localized area usually over a bony prominence.  Wound Description (Comments):   Present on Admission:     Unresulted Labs (From admission, onward)          Start     Ordered   10/21/20 [id number]  Basic metabolic panel  Tomorrow morning,   R        10/20/20 1057   10/21/20 0500  Magnesium  Tomorrow morning,   R        10/20/20 1057   10/21/20 0500  CBC with Differential/Platelet  Tomorrow morning,   R        10/20/20 1427  DVT prophylaxis: enoxaparin (LOVENOX) injection 30 mg Start: 10/19/20 1800   Code Status:DNR Family Communication: Daughter at bedside Disposition:   Status is: Observation  Dispo: The patient is from: Home              Anticipated d/c is to: Home health versus skilled nursing facility, to be determined              Anticipated d/c date is: To be determined, currently she is very lethargic, poor oral intake, IV fluids, elevated creatinine                Consultants:   Orthopedics  Procedures:   Splint placement to  right ankle  Antimicrobials:    Anti-infectives (From admission, onward)   None         Objective: Vitals:   10/19/20 2049 10/20/20 0725 10/20/20 1530 10/20/20 1600  BP: (!) 149/80 (!) 142/53 (!) 184/60 (!) 180/66  Pulse: 75 88 75   Resp: '16 16 18   '$ Temp: 98.9 F (37.2 C) 98.5 F (36.9 C) 98.5 F (36.9 C)   TempSrc: Oral Oral Oral   SpO2: 94% 91% 95%   Weight:      Height:        Intake/Output Summary (Last 24 hours) at 10/20/2020 1719 Last data filed at 10/20/2020 0300 Gross per 24 hour  Intake 620 ml  Output --  Net 620 ml   Filed Weights   10/19/20 0900  Weight: 73 kg    Examination:  General exam: Very lethargic, open eyes briefly during conversation, able to say a few words before drifts [name] to sleep Respiratory system: Clear to auscultation.  Shallow breathing, does not appear in distress Cardiovascular system: S1 & S2 heard, RRR.  Gastrointestinal system: Abdomen is nondistended, soft and nontender.  Normal bowel sounds heard. Central nervous system: Very lethargic, left hemiparesis, right ankle in splint Extremities: Right ankle in splint Skin: No rashes, lesions or ulcers Psychiatry: Lethargic    Data Reviewed: I have personally reviewed following labs and imaging studies  CBC: Recent Labs  Lab 10/19/20 1036 10/20/20 0137  WBC 9.4 9.3  HGB 13.1 11.0*  HCT 40.2 34.0*  MCV 88.9 87.2  PLT 174 [id number]    Basic Metabolic Panel: Recent Labs  Lab 10/19/20 1036 10/20/20 0137  NA 137 135  K 4.2 3.9  CL 112* 110  CO2 15* 21*  GLUCOSE 160* 146*  BUN 36* 35*  CREATININE 2.01* 2.23*  CALCIUM 8.6* 8.6*    GFR: Estimated Creatinine Clearance: 20.4 mL/min (A) (by C-G formula based on SCr of 2.23 mg/dL (H)).  Liver Function Tests: No results for input(s): AST, ALT, ALKPHOS, BILITOT, PROT, ALBUMIN in the last 168 hours.  CBG: Recent Labs  Lab 10/19/20 1701 10/19/20 2052 10/20/20 1122 10/20/20 1705  GLUCAP 111* 114* 132* 126*      Recent Results (from the past 240 hour(s))  SARS CORONAVIRUS 2 (TAT 6-24 HRS) Nasopharyngeal Nasopharyngeal Swab     Status: None   Collection Time: 10/19/20  1:06 PM   Specimen: Nasopharyngeal Swab  Result Value Ref Range Status   SARS Coronavirus 2 NEGATIVE NEGATIVE Final    Comment: (NOTE) SARS-CoV-2 target nucleic acids are NOT DETECTED.  The SARS-CoV-2 RNA is generally detectable in upper and lower respiratory specimens during the acute phase of infection. Negative results do not preclude SARS-CoV-2 infection, do not rule out co-infections with other pathogens, and should not be used as the sole basis for treatment  or other patient management decisions. Negative results must be combined with clinical observations, patient history, and epidemiological information. The expected result is Negative.  Fact Sheet for Patients: SugarRoll.be  Fact Sheet for Healthcare Providers: https://www.woods-mathews.com/  This test is not yet approved or cleared by the [place] FDA and  has been authorized for detection and/or diagnosis of SARS-CoV-2 by FDA under an Emergency Use Authorization (EUA). This EUA will remain  in effect (meaning this test can be used) for the duration of the COVID-19 declaration under Se ction 564(b)(1) of the Act, 21 U.S.C. section 360bbb-3(b)(1), unless the authorization is terminated or revoked sooner.  Performed at [facility], [place] [address], [place], [place] [address]          Radiology Studies: DG Ankle Complete Right  Result Date: 10/19/2020 CLINICAL DATA:  Fall, RIGHT ankle and knee pain EXAM: RIGHT ANKLE - COMPLETE 3+ VIEW COMPARISON:  None FINDINGS: Osseous demineralization. Diffuse soft tissue swelling RIGHT ankle extending into lower leg. Ankle joint space preserved. Mildly displaced fracture of the lateral malleolus. Mildly displaced fracture of the medial malleolus. Moderate-sized  plantar calcaneal spur. No additional fracture, dislocation, or bone destruction. IMPRESSION: Mildly displaced bimalleolar fractures RIGHT ankle with associated soft tissue swelling. Electronically Signed   By: Lavonia Dana M.D.   On: 10/19/2020 10:13   DG Knee Complete 4 Views Right  Result Date: 10/19/2020 CLINICAL DATA:  RIGHT ankle RIGHT knee pain post fall EXAM: RIGHT KNEE - COMPLETE 4+ VIEW COMPARISON:  None FINDINGS: Osseous demineralization. Joint spaces preserved. No acute fracture, dislocation, or bone destruction. No joint effusion. Atherosclerotic calcifications of distal superficial femoral, popliteal, and runoff vessels. IMPRESSION: Osseous demineralization. No acute osseous abnormalities. Electronically Signed   By: Lavonia Dana M.D.   On: 10/19/2020 10:12   DG Hip Unilat With Pelvis 2-3 Views Right  Result Date: 10/19/2020 CLINICAL DATA:  Status post fall.  Right hip pain. EXAM: DG HIP (WITH OR WITHOUT PELVIS) 2-3V RIGHT COMPARISON:  None. FINDINGS: There is no evidence of hip fracture or dislocation. There is no evidence of arthropathy or other focal bone abnormality. Vascular calcifications noted. IMPRESSION: Negative. Electronically Signed   By: Fidela Salisbury M.D.   On: 10/19/2020 11:12        Scheduled Meds: . aspirin EC  81 mg Oral Daily  . docusate sodium  100 mg Oral BID  . enoxaparin (LOVENOX) injection  30 mg Subcutaneous Q24H  . insulin aspart  0-9 Units Subcutaneous TID WC  . metoprolol succinate  25 mg Oral Daily  . multivitamin with minerals  1 tablet Oral Daily   Continuous Infusions: . lactated ringers 75 mL/hr at 10/20/20 1104     LOS: 0 days   Time spent: 43mns Greater than 50% of this time was spent in counseling, explanation of diagnosis, planning of further management, and coordination of care.   Voice Recognition /Viviann Sparedictation system was used to create this note, attempts have been made to correct errors. Please contact the author with  questions and/or clarifications.   FFlorencia Reasons MD PhD FACP [facility]  Available via Epic secure chat 7am-7pm for nonurgent issues Please page for urgent issues To page the attending provider between 7A-7P or the covering provider during after hours 7P-7A, please log into the web site [email or web address] and access using universal [place] password for that web site. If you do not have the password, please call the hospital operator.    10/20/2020, 5:19 PM

## 2020-10-21 DIAGNOSIS — S82841A Displaced bimalleolar fracture of right lower leg, initial encounter for closed fracture: Secondary | ICD-10-CM | POA: Diagnosis not present

## 2020-10-21 LAB — BASIC METABOLIC PANEL
Anion gap: 9 (ref 5–15)
BUN: 33 mg/dL — ABNORMAL HIGH (ref 8–23)
CO2: 20 mmol/L — ABNORMAL LOW (ref 22–32)
Calcium: 8.7 mg/dL — ABNORMAL LOW (ref 8.9–10.3)
Chloride: 110 mmol/L (ref 98–111)
Creatinine, Ser: 2.19 mg/dL — ABNORMAL HIGH (ref 0.44–1.00)
GFR, Estimated: 23 mL/min — ABNORMAL LOW (ref >60)
Glucose, Bld: 115 mg/dL — ABNORMAL HIGH (ref 70–99)
Potassium: 3.9 mmol/L (ref 3.5–5.1)
Sodium: 139 mmol/L (ref 135–145)

## 2020-10-21 LAB — CBC WITH DIFFERENTIAL/PLATELET
Abs Immature Granulocytes: 0.04 10*3/uL (ref 0.00–0.07)
Basophils Absolute: 0.1 10*3/uL (ref 0.0–0.1)
Basophils Relative: 1 %
Eosinophils Absolute: 0.4 10*3/uL (ref 0.0–0.5)
Eosinophils Relative: 4 %
HCT: 32.1 % — ABNORMAL LOW (ref 36.0–46.0)
Hemoglobin: 10.6 g/dL — ABNORMAL LOW (ref 12.0–15.0)
Immature Granulocytes: 1 %
Lymphocytes Relative: 20 %
Lymphs Abs: 1.7 10*3/uL (ref 0.7–4.0)
MCH: 28.7 pg (ref 26.0–34.0)
MCHC: 33 g/dL (ref 30.0–36.0)
MCV: 87 fL (ref 80.0–100.0)
Monocytes Absolute: 0.9 10*3/uL (ref 0.1–1.0)
Monocytes Relative: 11 %
Neutro Abs: 5.5 10*3/uL (ref 1.7–7.7)
Neutrophils Relative %: 63 %
Platelets: 241 10*3/uL (ref 150–400)
RBC: 3.69 MIL/uL — ABNORMAL LOW (ref 3.87–5.11)
RDW: 13.2 % (ref 11.5–15.5)
WBC: 8.6 10*3/uL (ref 4.0–10.5)
nRBC: 0 % (ref 0.0–0.2)

## 2020-10-21 LAB — GLUCOSE, CAPILLARY
Glucose-Capillary: 99 mg/dL (ref 70–99)
Glucose-Capillary: 120 mg/dL — ABNORMAL HIGH (ref 70–99)
Glucose-Capillary: 116 mg/dL — ABNORMAL HIGH (ref 70–99)
Glucose-Capillary: 142 mg/dL — ABNORMAL HIGH (ref 70–99)

## 2020-10-21 LAB — MAGNESIUM: Magnesium: 1.9 mg/dL (ref 1.7–2.4)

## 2020-10-21 MED ORDER — LACTATED RINGERS IV SOLN: INTRAVENOUS | Status: DC

## 2020-10-21 MED ORDER — SENNOSIDES-DOCUSATE SODIUM 8.6-50 MG PO TABS
1.0000 | ORAL_TABLET | Freq: Two times a day (BID) | ORAL | Status: DC
  Administered 2020-10-21 – 2020-10-26 (×9): 1 via ORAL
  Filled 2020-10-21 (×10): qty 1

## 2020-10-21 MED ORDER — METHOCARBAMOL 500 MG PO TABS
500.0000 mg | ORAL_TABLET | Freq: Four times a day (QID) | ORAL | Status: AC | PRN
Start: 2020-10-21 — End: 2020-10-21
  Administered 2020-10-21 (×2): 500 mg via ORAL
  Filled 2020-10-21 (×2): qty 1

## 2020-10-21 MED ORDER — POLYETHYLENE GLYCOL 3350 17 G PO PACK
17.0000 g | PACK | Freq: Every day | ORAL | Status: DC
  Administered 2020-10-21 – 2020-10-26 (×4): 17 g via ORAL
  Filled 2020-10-21 (×6): qty 1

## 2020-10-21 MED ORDER — TRAMADOL HCL 50 MG PO TABS
50.0000 mg | ORAL_TABLET | Freq: Two times a day (BID) | ORAL | Status: DC | PRN
  Administered 2020-10-21 – 2020-10-23 (×4): 50 mg via ORAL
  Filled 2020-10-21 (×4): qty 1

## 2020-10-21 MED ORDER — METHOCARBAMOL 500 MG PO TABS
500.0000 mg | ORAL_TABLET | Freq: Three times a day (TID) | ORAL | Status: AC | PRN
Start: 2020-10-21 — End: 2020-10-22
  Administered 2020-10-22 (×3): 500 mg via ORAL
  Filled 2020-10-21 (×3): qty 1

## 2020-10-21 MED ORDER — METOPROLOL TARTRATE 5 MG/5ML IV SOLN
5.0000 mg | INTRAVENOUS | Status: AC | PRN
  Administered 2020-10-22 (×2): 5 mg via INTRAVENOUS
  Filled 2020-10-21 (×2): qty 5

## 2020-10-21 NOTE — Progress Notes (Signed)
[facility] informed that patient responded well to Robaxin per day shift RN inquired if this can be reordered. Arthor Captain LPN

## 2020-10-21 NOTE — Progress Notes (Signed)
PROGRESS NOTE    [name]  [id number] DOB: [birth year] DOA: 10/19/2020 PCP: Orma Flaming, MD    Chief Complaint  Patient presents with  . Fall    Brief Narrative:  [name] is a 75 y.o. female with medical history significant of HTN; DM; CVA with resultant L hemiplegia; and macular degeneration presenting with a fall.  She was getting up to get a Tylenol and her ankle gave way.   Found to have R bimalleolar ankle fracture and AKI She is from home and was able to transfer from chair to bed prior this injury  Subjective:  She appears a little more alert, she c/o left ankle/leg pain, daughter reports patient appears to have more pain after the splint placement. Oral intake has improved, but cr still elevated , will continue hydration for another 24hrs  Assessment & Plan:   Principal Problem:   Bimalleolar ankle fracture, right, closed, initial encounter Active Problems:   Diabetes mellitus without complication (HCC)   HTN (hypertension)   Overweight (BMI 25.0-29.9)   Left hemiparesis (HCC)   Stage 4 chronic kidney disease ([place])   Bimalleolar ankle fracture (right ) from mechanical fall - Ortho evaluated patient and recommended conservative management, splint put on in the ED, nonweightbearing left lower extremity, follow-up with Dr. Doran Durand in 2 weeks -daughter requested ortho reeval due to patient appears to have more pain after the splint placement, message sent to ortho - IV morphine d/ced , keep low-dose hydrocodone, continue Tylenol as needed for pain control, on stool softener as well. Start muscle relexer  AKI on CKDIV with metabolic acidosis,  -Creatinine baseline range from 1.7-1.8 -Creatinine appear peaked at  2.23 , today is 2.19 -bicarb 15 on presentation, received LR, bicarb improved to 20 today - continue hydration for another 24hrs, continue to hold Cozaar   - UA rare bacteria, no leuk, negative nitrite -repeat bmp in  am  Hypertension -On Cozaar at home, held due to AKI -started on metoprolol  Diet controlled diabetes -A1c 7.3 -Fasting blood glucose 146 -Needing control in a 75 year old frail elderly  -Currently on SSI in hospital  History of CVA with left hemiparesis -She is on aspirin at home, does not appear to be on statin   patient was very lethargic on 4/15, family think this is from IV morphine, appear improving today, will hold off ct head   Nutritional Assessment: The patient's BMI is: Body mass index is 29.45 kg/m.Marland Kitchen Seen by dietician.  I agree with the assessment and plan as outlined below: Nutrition Status: Nutrition Problem: Inadequate oral intake Etiology: lethargy/confusion Signs/Symptoms: per patient/family report Interventions: Magic cup  . Skin Assessment: I have examined the patient's skin and I agree with the wound assessment as performed by the wound care RN as outlined below:  Pressure Injury Stage I -  Intact skin with non-blanchable redness of a localized area usually over a bony prominence. (Active)     Location: Heel  Location Orientation: Right;Left;Lateral;Medial;Bilateral;Posterior  Staging: Stage I -  Intact skin with non-blanchable redness of a localized area usually over a bony prominence.  Wound Description (Comments):   Present on Admission:     Unresulted Labs (From admission, onward)          Start     Ordered   10/22/20 0500  CBC  Tomorrow morning,   R        10/21/20 1237   10/22/20 [id number]  Basic metabolic panel  Tomorrow morning,   R  10/21/20 1237   10/22/20 0500  Magnesium  Tomorrow morning,   R        10/21/20 1237            DVT prophylaxis: enoxaparin (LOVENOX) injection 30 mg Start: 10/19/20 1800   Code Status:DNR Family Communication: Daughter at bedside Disposition:   Status is: Observation  Dispo: The patient is from: Home              Anticipated d/c is to: Home health versus skilled nursing facility, to be  determined              Anticipated d/c date is: To be determined, currently she is  lethargic,  poor oral intake, IV fluids, elevated creatinine, pain control                Consultants:   Orthopedics  Procedures:   Splint placement to right ankle  Antimicrobials:    Anti-infectives (From admission, onward)   None         Objective: Vitals:   10/20/20 1600 10/20/20 1943 10/21/20 0810 10/21/20 1047  BP: (!) 180/66 (!) 159/51 (!) 181/62 (!) 182/62  Pulse:  72 82 72  Resp:  15 18   Temp:  99 F (37.2 C) 98.7 F (37.1 C)   TempSrc:  Oral Oral   SpO2:  91% 95%   Weight:      Height:        Intake/Output Summary (Last 24 hours) at 10/21/2020 1237 Last data filed at 10/21/2020 0500 Gross per 24 hour  Intake 1361.48 ml  Output --  Net 1361.48 ml   Filed Weights   10/19/20 0900  Weight: 73 kg    Examination:  General exam: remain lethargic, but improved some today,  able to say a few more words before drifts back to sleep Respiratory system: Clear to auscultation.  Shallow breathing, does not appear in distress Cardiovascular system: S1 & S2 heard, RRR.  Gastrointestinal system: Abdomen is nondistended, soft and nontender.  Normal bowel sounds heard. Central nervous system:  lethargic, left hemiparesis, right ankle in splint Extremities: Right ankle in splint Skin: No rashes, lesions or ulcers Psychiatry: Lethargic    Data Reviewed: I have personally reviewed following labs and imaging studies  CBC: Recent Labs  Lab 10/19/20 1036 10/20/20 0137 10/21/20 0127  WBC 9.4 9.3 8.6  NEUTROABS  --   --  5.5  HGB 13.1 11.0* 10.6*  HCT 40.2 34.0* 32.1*  MCV 88.9 87.2 87.0  PLT 174 266 [id number]    Basic Metabolic Panel: Recent Labs  Lab 10/19/20 1036 10/20/20 0137 10/21/20 0127  NA 137 135 139  K 4.2 3.9 3.9  CL 112* 110 110  CO2 15* 21* 20*  GLUCOSE 160* 146* 115*  BUN 36* 35* 33*  CREATININE 2.01* 2.23* 2.19*  CALCIUM 8.6* 8.6* 8.7*  MG  --   --  1.9     GFR: Estimated Creatinine Clearance: 20.8 mL/min (A) (by C-G formula based on SCr of 2.19 mg/dL (H)).  Liver Function Tests: No results for input(s): AST, ALT, ALKPHOS, BILITOT, PROT, ALBUMIN in the last 168 hours.  CBG: Recent Labs  Lab 10/20/20 1122 10/20/20 1705 10/20/20 1946 10/21/20 0639 10/21/20 1145  GLUCAP 132* 126* 124* 99 120*     Recent Results (from the past 240 hour(s))  SARS CORONAVIRUS 2 (TAT 6-24 HRS) Nasopharyngeal Nasopharyngeal Swab     Status: None   Collection Time: 10/19/20  1:06 PM   Specimen:  Nasopharyngeal Swab  Result Value Ref Range Status   SARS Coronavirus 2 NEGATIVE NEGATIVE Final    Comment: (NOTE) SARS-CoV-2 target nucleic acids are NOT DETECTED.  The SARS-CoV-2 RNA is generally detectable in upper and lower respiratory specimens during the acute phase of infection. Negative results do not preclude SARS-CoV-2 infection, do not rule out co-infections with other pathogens, and should not be used as the sole basis for treatment or other patient management decisions. Negative results must be combined with clinical observations, patient history, and epidemiological information. The expected result is Negative.  Fact Sheet for Patients: SugarRoll.be  Fact Sheet for Healthcare Providers: https://www.woods-mathews.com/  This test is not yet approved or cleared by the [place] FDA and  has been authorized for detection and/or diagnosis of SARS-CoV-2 by FDA under an Emergency Use Authorization (EUA). This EUA will remain  in effect (meaning this test can be used) for the duration of the COVID-19 declaration under Se ction 564(b)(1) of the Act, 21 U.S.C. section 360bbb-3(b)(1), unless the authorization is terminated or revoked sooner.  Performed at [facility], [place] [address], [place], [place] [address]          Radiology Studies: No results found.      Scheduled Meds: .  aspirin EC  81 mg Oral Daily  . enoxaparin (LOVENOX) injection  30 mg Subcutaneous Q24H  . insulin aspart  0-9 Units Subcutaneous TID WC  . metoprolol succinate  25 mg Oral Daily  . multivitamin with minerals  1 tablet Oral Daily  . polyethylene glycol  17 g Oral Daily  . senna-docusate  1 tablet Oral BID   Continuous Infusions: . lactated ringers       LOS: 0 days   Time spent: 71mns Greater than 50% of this time was spent in counseling, explanation of diagnosis, planning of further management, and coordination of care.   Voice Recognition /Viviann Sparedictation system was used to create this note, attempts have been made to correct errors. Please contact the author with questions and/or clarifications.   FFlorencia Reasons MD PhD FACP [facility]  Available via Epic secure chat 7am-7pm for nonurgent issues Please page for urgent issues To page the attending provider between 7A-7P or the covering provider during after hours 7P-7A, please log into the web site [email or web address] and access using universal  password for that web site. If you do not have the password, please call the hospital operator.    10/21/2020, 12:37 PM

## 2020-10-21 NOTE — Progress Notes (Signed)
Pt alert and aware in bed sitting up eating dinner. Pt's daughter help mom to eat. The daughter gave some history as to how she came to be here at the hospital.  The chaplain offered caring and supportive presence, prayers and blessings. Further visits would be welcomed.

## 2020-10-21 NOTE — Progress Notes (Signed)
[facility] paged bp 194/76 hr 83 hydralazine 5 mg IV was given '@2203'$  for bp 182/63 awaiting new orders

## 2020-10-21 NOTE — Plan of Care (Signed)
  Problem: Education: Goal: Knowledge of the prescribed therapeutic regimen will improve Outcome: Progressing   Problem: Pain Management: Goal: Pain level will decrease with appropriate interventions Outcome: Progressing   Problem: Skin Integrity: Goal: Will show signs of wound healing Outcome: Progressing   

## 2020-10-21 NOTE — Plan of Care (Signed)
  Problem: Education: Goal: Ability to describe self-care measures that may prevent or decrease complications (Diabetes Survival Skills Education) will improve Outcome: Progressing Goal: Individualized Educational Video(s) Outcome: Progressing   Problem: Coping: Goal: Ability to adjust to condition or change in health will improve Outcome: Progressing   Problem: Fluid Volume: Goal: Ability to maintain a balanced intake and output will improve Outcome: Progressing   Problem: Health Behavior/Discharge Planning: Goal: Ability to identify and utilize available resources and services will improve Outcome: Progressing Goal: Ability to manage health-related needs will improve Outcome: Progressing   Problem: Metabolic: Goal: Ability to maintain appropriate glucose levels will improve Outcome: Progressing   Problem: Nutritional: Goal: Maintenance of adequate nutrition will improve Outcome: Progressing Goal: Progress toward achieving an optimal weight will improve Outcome: Progressing   Problem: Skin Integrity: Goal: Risk for impaired skin integrity will decrease Outcome: Progressing   Problem: Tissue Perfusion: Goal: Adequacy of tissue perfusion will improve Outcome: Progressing   Problem: Education: Goal: Knowledge of the prescribed therapeutic regimen will improve Outcome: Progressing   Problem: Activity: Goal: Ability to increase mobility will improve Outcome: Progressing   Problem: Physical Regulation: Goal: Postoperative complications will be avoided or minimized Outcome: Progressing   Problem: Pain Management: Goal: Pain level will decrease with appropriate interventions Outcome: Progressing   Problem: Skin Integrity: Goal: Will show signs of wound healing Outcome: Progressing   Problem: Education: Goal: Knowledge of General Education information will improve Description: Including pain rating scale, medication(s)/side effects and non-pharmacologic comfort  measures Outcome: Progressing   Problem: Health Behavior/Discharge Planning: Goal: Ability to manage health-related needs will improve Outcome: Progressing   Problem: Clinical Measurements: Goal: Ability to maintain clinical measurements within normal limits will improve Outcome: Progressing Goal: Will remain free from infection Outcome: Progressing Goal: Diagnostic test results will improve Outcome: Progressing Goal: Respiratory complications will improve Outcome: Progressing Goal: Cardiovascular complication will be avoided Outcome: Progressing   Problem: Activity: Goal: Risk for activity intolerance will decrease Outcome: Progressing   Problem: Nutrition: Goal: Adequate nutrition will be maintained Outcome: Progressing   Problem: Coping: Goal: Level of anxiety will decrease Outcome: Progressing   Problem: Elimination: Goal: Will not experience complications related to bowel motility Outcome: Progressing Goal: Will not experience complications related to urinary retention Outcome: Progressing   Problem: Pain Managment: Goal: General experience of comfort will improve Outcome: Progressing   Problem: Safety: Goal: Ability to remain free from injury will improve Outcome: Progressing   Problem: Skin Integrity: Goal: Risk for impaired skin integrity will decrease Outcome: Progressing

## 2020-10-22 DIAGNOSIS — M79604 Pain in right leg: Secondary | ICD-10-CM | POA: Diagnosis not present

## 2020-10-22 DIAGNOSIS — Z7982 Long term (current) use of aspirin: Secondary | ICD-10-CM | POA: Diagnosis not present

## 2020-10-22 DIAGNOSIS — Z841 Family history of disorders of kidney and ureter: Secondary | ICD-10-CM | POA: Diagnosis not present

## 2020-10-22 DIAGNOSIS — I129 Hypertensive chronic kidney disease with stage 1 through stage 4 chronic kidney disease, or unspecified chronic kidney disease: Secondary | ICD-10-CM | POA: Diagnosis present

## 2020-10-22 DIAGNOSIS — Z20822 Contact with and (suspected) exposure to covid-19: Secondary | ICD-10-CM | POA: Diagnosis present

## 2020-10-22 DIAGNOSIS — I69354 Hemiplegia and hemiparesis following cerebral infarction affecting left non-dominant side: Secondary | ICD-10-CM | POA: Diagnosis not present

## 2020-10-22 DIAGNOSIS — Z888 Allergy status to other drugs, medicaments and biological substances status: Secondary | ICD-10-CM | POA: Diagnosis not present

## 2020-10-22 DIAGNOSIS — N179 Acute kidney failure, unspecified: Secondary | ICD-10-CM | POA: Diagnosis present

## 2020-10-22 DIAGNOSIS — N184 Chronic kidney disease, stage 4 (severe): Secondary | ICD-10-CM | POA: Diagnosis present

## 2020-10-22 DIAGNOSIS — Z885 Allergy status to narcotic agent status: Secondary | ICD-10-CM | POA: Diagnosis not present

## 2020-10-22 DIAGNOSIS — Z66 Do not resuscitate: Secondary | ICD-10-CM | POA: Diagnosis present

## 2020-10-22 DIAGNOSIS — L89611 Pressure ulcer of right heel, stage 1: Secondary | ICD-10-CM | POA: Diagnosis present

## 2020-10-22 DIAGNOSIS — S82841A Displaced bimalleolar fracture of right lower leg, initial encounter for closed fracture: Secondary | ICD-10-CM | POA: Diagnosis present

## 2020-10-22 DIAGNOSIS — E663 Overweight: Secondary | ICD-10-CM | POA: Diagnosis present

## 2020-10-22 DIAGNOSIS — L89621 Pressure ulcer of left heel, stage 1: Secondary | ICD-10-CM | POA: Diagnosis present

## 2020-10-22 DIAGNOSIS — S82899A Other fracture of unspecified lower leg, initial encounter for closed fracture: Secondary | ICD-10-CM | POA: Diagnosis present

## 2020-10-22 DIAGNOSIS — E1122 Type 2 diabetes mellitus with diabetic chronic kidney disease: Secondary | ICD-10-CM | POA: Diagnosis present

## 2020-10-22 DIAGNOSIS — Z79899 Other long term (current) drug therapy: Secondary | ICD-10-CM | POA: Diagnosis not present

## 2020-10-22 DIAGNOSIS — W1830XA Fall on same level, unspecified, initial encounter: Secondary | ICD-10-CM | POA: Diagnosis present

## 2020-10-22 DIAGNOSIS — Z8249 Family history of ischemic heart disease and other diseases of the circulatory system: Secondary | ICD-10-CM | POA: Diagnosis not present

## 2020-10-22 DIAGNOSIS — H3530 Unspecified macular degeneration: Secondary | ICD-10-CM | POA: Diagnosis present

## 2020-10-22 DIAGNOSIS — Z6829 Body mass index (BMI) 29.0-29.9, adult: Secondary | ICD-10-CM | POA: Diagnosis not present

## 2020-10-22 DIAGNOSIS — M62838 Other muscle spasm: Secondary | ICD-10-CM | POA: Diagnosis present

## 2020-10-22 LAB — CBC
HCT: 33.5 % — ABNORMAL LOW (ref 36.0–46.0)
Hemoglobin: 11.2 g/dL — ABNORMAL LOW (ref 12.0–15.0)
MCH: 28.2 pg (ref 26.0–34.0)
MCHC: 33.4 g/dL (ref 30.0–36.0)
MCV: 84.4 fL (ref 80.0–100.0)
Platelets: 269 10*3/uL (ref 150–400)
RBC: 3.97 MIL/uL (ref 3.87–5.11)
RDW: 13 % (ref 11.5–15.5)
WBC: 10.2 10*3/uL (ref 4.0–10.5)
nRBC: 0 % (ref 0.0–0.2)

## 2020-10-22 LAB — GLUCOSE, CAPILLARY
Glucose-Capillary: 129 mg/dL — ABNORMAL HIGH (ref 70–99)
Glucose-Capillary: 139 mg/dL — ABNORMAL HIGH (ref 70–99)
Glucose-Capillary: 123 mg/dL — ABNORMAL HIGH (ref 70–99)
Glucose-Capillary: 151 mg/dL — ABNORMAL HIGH (ref 70–99)

## 2020-10-22 LAB — BASIC METABOLIC PANEL
Anion gap: 7 (ref 5–15)
BUN: 28 mg/dL — ABNORMAL HIGH (ref 8–23)
CO2: 20 mmol/L — ABNORMAL LOW (ref 22–32)
Calcium: 8.8 mg/dL — ABNORMAL LOW (ref 8.9–10.3)
Chloride: 109 mmol/L (ref 98–111)
Creatinine, Ser: 1.86 mg/dL — ABNORMAL HIGH (ref 0.44–1.00)
GFR, Estimated: 28 mL/min — ABNORMAL LOW (ref >60)
Glucose, Bld: 133 mg/dL — ABNORMAL HIGH (ref 70–99)
Potassium: 3.7 mmol/L (ref 3.5–5.1)
Sodium: 136 mmol/L (ref 135–145)

## 2020-10-22 LAB — MAGNESIUM: Magnesium: 1.8 mg/dL (ref 1.7–2.4)

## 2020-10-22 MED ORDER — LACTATED RINGERS IV SOLN: INTRAVENOUS | Status: DC

## 2020-10-22 MED ORDER — HYDRALAZINE HCL 20 MG/ML IJ SOLN
10.0000 mg | INTRAMUSCULAR | Status: DC | PRN
  Administered 2020-10-22 – 2020-10-24 (×3): 10 mg via INTRAVENOUS
  Filled 2020-10-22 (×3): qty 1

## 2020-10-22 NOTE — Plan of Care (Signed)
  Problem: Education: Goal: Ability to describe self-care measures that may prevent or decrease complications (Diabetes Survival Skills Education) will improve Outcome: Progressing Goal: Individualized Educational Video(s) Outcome: Progressing   Problem: Coping: Goal: Ability to adjust to condition or change in health will improve Outcome: Progressing   Problem: Fluid Volume: Goal: Ability to maintain a balanced intake and output will improve Outcome: Progressing   Problem: Health Behavior/Discharge Planning: Goal: Ability to identify and utilize available resources and services will improve Outcome: Progressing Goal: Ability to manage health-related needs will improve Outcome: Progressing   Problem: Metabolic: Goal: Ability to maintain appropriate glucose levels will improve Outcome: Progressing   Problem: Nutritional: Goal: Maintenance of adequate nutrition will improve Outcome: Progressing Goal: Progress toward achieving an optimal weight will improve Outcome: Progressing   Problem: Skin Integrity: Goal: Risk for impaired skin integrity will decrease Outcome: Progressing   Problem: Tissue Perfusion: Goal: Adequacy of tissue perfusion will improve Outcome: Progressing   Problem: Education: Goal: Knowledge of the prescribed therapeutic regimen will improve Outcome: Progressing   Problem: Activity: Goal: Ability to increase mobility will improve Outcome: Progressing   Problem: Physical Regulation: Goal: Postoperative complications will be avoided or minimized Outcome: Progressing   Problem: Pain Management: Goal: Pain level will decrease with appropriate interventions Outcome: Progressing   Problem: Skin Integrity: Goal: Will show signs of wound healing Outcome: Progressing   Problem: Education: Goal: Knowledge of General Education information will improve Description: Including pain rating scale, medication(s)/side effects and non-pharmacologic comfort  measures Outcome: Progressing   Problem: Health Behavior/Discharge Planning: Goal: Ability to manage health-related needs will improve Outcome: Progressing   Problem: Clinical Measurements: Goal: Ability to maintain clinical measurements within normal limits will improve Outcome: Progressing Goal: Will remain free from infection Outcome: Progressing Goal: Diagnostic test results will improve Outcome: Progressing Goal: Respiratory complications will improve Outcome: Progressing Goal: Cardiovascular complication will be avoided Outcome: Progressing   Problem: Activity: Goal: Risk for activity intolerance will decrease Outcome: Progressing   Problem: Nutrition: Goal: Adequate nutrition will be maintained Outcome: Progressing   Problem: Coping: Goal: Level of anxiety will decrease Outcome: Progressing   Problem: Elimination: Goal: Will not experience complications related to bowel motility Outcome: Progressing Goal: Will not experience complications related to urinary retention Outcome: Progressing   Problem: Pain Managment: Goal: General experience of comfort will improve Outcome: Progressing   Problem: Safety: Goal: Ability to remain free from injury will improve Outcome: Progressing   Problem: Skin Integrity: Goal: Risk for impaired skin integrity will decrease Outcome: Progressing

## 2020-10-22 NOTE — Plan of Care (Signed)
  Problem: Education: Goal: Ability to describe self-care measures that may prevent or decrease complications (Diabetes Survival Skills Education) will improve Outcome: Progressing Goal: Individualized Educational Video(s) Outcome: Progressing   Problem: Coping: Goal: Ability to adjust to condition or change in health will improve Outcome: Progressing   Problem: Health Behavior/Discharge Planning: Goal: Ability to identify and utilize available resources and services will improve Outcome: Progressing Goal: Ability to manage health-related needs will improve Outcome: Progressing   Problem: Metabolic: Goal: Ability to maintain appropriate glucose levels will improve Outcome: Progressing   Problem: Nutritional: Goal: Maintenance of adequate nutrition will improve Outcome: Progressing Goal: Progress toward achieving an optimal weight will improve Outcome: Progressing   Problem: Skin Integrity: Goal: Risk for impaired skin integrity will decrease Outcome: Progressing   Problem: Tissue Perfusion: Goal: Adequacy of tissue perfusion will improve Outcome: Progressing   Problem: Education: Goal: Knowledge of the prescribed therapeutic regimen will improve Outcome: Progressing   Problem: Physical Regulation: Goal: Postoperative complications will be avoided or minimized Outcome: Progressing   Problem: Activity: Goal: Ability to increase mobility will improve Outcome: Progressing   Problem: Pain Management: Goal: Pain level will decrease with appropriate interventions Outcome: Progressing   Problem: Skin Integrity: Goal: Will show signs of wound healing Outcome: Progressing

## 2020-10-22 NOTE — Progress Notes (Addendum)
PROGRESS NOTE    [name]  [id number] DOB: 22-Dec-1945 DOA: 10/19/2020 PCP: Orma Flaming, MD    Chief Complaint  Patient presents with  . Fall    Brief Narrative:  [name] is a 75 y.o. female with medical history significant of HTN; DM; CVA with resultant L hemiplegia; and macular degeneration presenting with a fall.  She was getting up to get a Tylenol and her ankle gave way.   Found to have R bimalleolar ankle fracture and AKI She is from home and was able to transfer from chair to bed prior this injury  Subjective:  She appears more alert,  Appears in less pain Still has poor appetite, daughter would want one more day of hydration She has not been to get out of bed even with assistant due to pain and lethargy Cr improved,  No bm  Assessment & Plan:   Principal Problem:   Bimalleolar ankle fracture, right, closed, initial encounter Active Problems:   Diabetes mellitus without complication ([place])   HTN (hypertension)   Overweight (BMI 25.0-29.9)   Left hemiparesis (HCC)   Stage 4 chronic kidney disease ([place])   Bimalleolar ankle fracture (right ) from mechanical fall - Ortho evaluated patient and recommended conservative management, splint put on in the ED, nonweightbearing left lower extremity, follow-up with Dr. Doran Durand in 2 weeks -daughter requested ortho reeval due to patient appears to have more pain after the splint placement, ortho recommend "rewrapping the ace and pad sore areas" - IV morphine d/ced due to severe lethargy, morphine is put on allergy list per family request , family ok with  low-dose hydrocodone, ultram, muscle relaxers and prn Tylenol  -she has left side hemiparesis from prior CVA now with right side ankle fracture, she needs significant amount of assistance, however, family does not want snf, daughter prefers get patient home once  her pain better controlled and is more mobile  -family does not think she is strong enough to go  home today  AKI on CKDIV with metabolic acidosis,  -Creatinine baseline range from 1.7-1.8 -Creatinine appear peaked at  2.23 , today is 1.86 -bicarb 15 on presentation, received LR, bicarb improved to 20 today - continue hydration for another 24hrs due to continued poor oral intake due to lethargy and pain, continue to hold Cozaar   - UA rare bacteria, no leuk, negative nitrite -repeat bmp in am  Hypertension -On Cozaar at home, held due to AKI -started on metoprolol  Diet controlled diabetes -A1c 7.3 -Fasting blood glucose 146 -Needing control in a 75 year old frail elderly  -Currently on SSI in hospital  History of CVA with left hemiparesis -She is on aspirin at home, does not appear to be on statin   patient was very lethargic on 4/15, family think this is from IV morphine, improving   Nutritional Assessment: The patient's BMI is: Body mass index is 29.45 kg/m.Marland Kitchen Seen by dietician.  I agree with the assessment and plan as outlined below: Nutrition Status: Nutrition Problem: Inadequate oral intake Etiology: lethargy/confusion Signs/Symptoms: per patient/family report Interventions: Magic cup  . Skin Assessment: I have examined the patient's skin and I agree with the wound assessment as performed by the wound care RN as outlined below:  Pressure Injury Stage I -  Intact skin with non-blanchable redness of a localized area usually over a bony prominence. (Active)     Location: Heel  Location Orientation: Right;Left;Lateral;Medial;Bilateral;Posterior  Staging: Stage I -  Intact skin with non-blanchable redness of  a localized area usually over a bony prominence.  Wound Description (Comments):   Present on Admission:     Unresulted Labs (From admission, onward)         None        DVT prophylaxis: enoxaparin (LOVENOX) injection 30 mg Start: 10/19/20 1800   Code Status:DNI only, confirmed with patient and daughter  Family Communication: Daughter at bedside  daily Disposition:   Status is: Inpatient  Dispo: The patient is from: Home              Anticipated d/c is to: daughter declined snf, she prefers Home health               Anticipated d/c date is: home with home health  In 1-2 days once less lethargic, less pain, better oral intake, sill on ivf        daughter requested hospital bed, order placed        Consultants:   Orthopedics  Procedures:   Splint placement to right ankle  Antimicrobials:    Anti-infectives (From admission, onward)   None         Objective: Vitals:   10/22/20 0129 10/22/20 0200 10/22/20 0229 10/22/20 0500  BP: (!) 220/100 (!) 200/80 (!) 150/70 (!) 155/53  Pulse:    73  Resp:    14  Temp:    98.3 F (36.8 C)  TempSrc:    Oral  SpO2:    100%  Weight:      Height:        Intake/Output Summary (Last 24 hours) at 10/22/2020 0931 Last data filed at 10/22/2020 0901 Gross per 24 hour  Intake --  Output 100 ml  Net -100 ml   Filed Weights   10/19/20 0900  Weight: 73 kg    Examination:  General exam: less lethargic,  Respiratory system: Clear to auscultation.  Shallow breathing, does not appear in distress Cardiovascular system: S1 & S2 heard, RRR.  Gastrointestinal system: Abdomen is nondistended, soft and nontender.  Normal bowel sounds heard. Central nervous system: less lethargic, left hemiparesis, left upper extremity contracture,  right ankle in splint Extremities: Right ankle in splint Skin: No rashes, lesions or ulcers Psychiatry: Lethargic    Data Reviewed: I have personally reviewed following labs and imaging studies  CBC: Recent Labs  Lab 10/19/20 1036 10/20/20 0137 10/21/20 0127 10/22/20 0138  WBC 9.4 9.3 8.6 10.2  NEUTROABS  --   --  5.5  --   HGB 13.1 11.0* 10.6* 11.2*  HCT 40.2 34.0* 32.1* 33.5*  MCV 88.9 87.2 87.0 84.4  PLT 174 266 241 [id number]    Basic Metabolic Panel: Recent Labs  Lab 10/19/20 1036 10/20/20 0137 10/21/20 0127 10/22/20 0138  NA 137 135  139 136  K 4.2 3.9 3.9 3.7  CL 112* 110 110 109  CO2 15* 21* 20* 20*  GLUCOSE 160* 146* 115* 133*  BUN 36* 35* 33* 28*  CREATININE 2.01* 2.23* 2.19* 1.86*  CALCIUM 8.6* 8.6* 8.7* 8.8*  MG  --   --  1.9 1.8    GFR: Estimated Creatinine Clearance: 24.5 mL/min (A) (by C-G formula based on SCr of 1.86 mg/dL (H)).  Liver Function Tests: No results for input(s): AST, ALT, ALKPHOS, BILITOT, PROT, ALBUMIN in the last 168 hours.  CBG: Recent Labs  Lab 10/21/20 0639 10/21/20 1145 10/21/20 1735 10/21/20 2237 10/22/20 0625  GLUCAP 99 120* 116* 142* 129*     Recent Results (from the past 240  hour(s))  SARS CORONAVIRUS 2 (TAT 6-24 HRS) Nasopharyngeal Nasopharyngeal Swab     Status: None   Collection Time: 10/19/20  1:06 PM   Specimen: Nasopharyngeal Swab  Result Value Ref Range Status   SARS Coronavirus 2 NEGATIVE NEGATIVE Final    Comment: (NOTE) SARS-CoV-2 target nucleic acids are NOT DETECTED.  The SARS-CoV-2 RNA is generally detectable in upper and lower respiratory specimens during the acute phase of infection. Negative results do not preclude SARS-CoV-2 infection, do not rule out co-infections with other pathogens, and should not be used as the sole basis for treatment or other patient management decisions. Negative results must be combined with clinical observations, patient history, and epidemiological information. The expected result is Negative.  Fact Sheet for Patients: SugarRoll.be  Fact Sheet for Healthcare Providers: https://www.woods-mathews.com/  This test is not yet approved or cleared by the [place] FDA and  has been authorized for detection and/or diagnosis of SARS-CoV-2 by FDA under an Emergency Use Authorization (EUA). This EUA will remain  in effect (meaning this test can be used) for the duration of the COVID-19 declaration under Se ction 564(b)(1) of the Act, 21 U.S.C. section 360bbb-3(b)(1), unless the  authorization is terminated or revoked sooner.  Performed at [facility], [place] [address] [place], [place] [address]          Radiology Studies: No results found.      Scheduled Meds: . aspirin EC  81 mg Oral Daily  . enoxaparin (LOVENOX) injection  30 mg Subcutaneous Q24H  . insulin aspart  0-9 Units Subcutaneous TID WC  . metoprolol succinate  25 mg Oral Daily  . multivitamin with minerals  1 tablet Oral Daily  . polyethylene glycol  17 g Oral Daily  . senna-docusate  1 tablet Oral BID   Continuous Infusions: . lactated ringers       LOS: 0 days   Time spent: 34mns Greater than 50% of this time was spent in counseling, explanation of diagnosis, planning of further management, and coordination of care.   Voice Recognition /Viviann Sparedictation system was used to create this note, attempts have been made to correct errors. Please contact the author with questions and/or clarifications.   FFlorencia Reasons MD PhD FACP [facility]  Available via Epic secure chat 7am-7pm for nonurgent issues Please page for urgent issues To page the attending provider between 7A-7P or the covering provider during after hours 7P-7A, please log into the web site [email or web address] and access using universal [place] password for that web site. If you do not have the password, please call the hospital operator.    10/22/2020, 9:31 AM

## 2020-10-22 NOTE — Progress Notes (Signed)
Per patient and daughter they would like to change resuscitation status. Patient educated about DNR/DNI and advance directive options. Patient and family would like to change status to DNI and would like to receive CPR if needed. Patient would like daughter, Hester Mates, to be named as her health care proxy in the event patient is not able to make her own decisions. MD Erlinda Hong notified.

## 2020-10-23 LAB — GLUCOSE, CAPILLARY
Glucose-Capillary: 139 mg/dL — ABNORMAL HIGH (ref 70–99)
Glucose-Capillary: 126 mg/dL — ABNORMAL HIGH (ref 70–99)
Glucose-Capillary: 146 mg/dL — ABNORMAL HIGH (ref 70–99)
Glucose-Capillary: 137 mg/dL — ABNORMAL HIGH (ref 70–99)

## 2020-10-23 MED ORDER — HYDRALAZINE HCL 25 MG PO TABS
25.0000 mg | ORAL_TABLET | Freq: Three times a day (TID) | ORAL | Status: DC
  Administered 2020-10-23 – 2020-10-24 (×3): 25 mg via ORAL
  Filled 2020-10-23 (×3): qty 1

## 2020-10-23 MED ORDER — LACTATED RINGERS IV SOLN: INTRAVENOUS | Status: DC

## 2020-10-23 MED ORDER — METHOCARBAMOL 500 MG PO TABS
500.0000 mg | ORAL_TABLET | Freq: Three times a day (TID) | ORAL | Status: DC | PRN
  Administered 2020-10-23 – 2020-10-26 (×5): 500 mg via ORAL
  Filled 2020-10-23 (×5): qty 1

## 2020-10-23 NOTE — Progress Notes (Signed)
Patient ID: [name], female   DOB: 07-23-1945, 75 y.o.   MRN: [id number]   LOS: 1 day   Subjective: C/o right calf pain, thinks splint is causing issue.   Objective: Vital signs in last 24 hours: Temp:  [97.4 F (36.3 C)-98.7 F (37.1 C)] 98.4 F (36.9 C) (04/18 1510) Pulse Rate:  [64-71] 71 (04/18 1510) Resp:  [14-17] 14 (04/18 1510) BP: (144-187)/(59-76) 186/73 (04/18 1510) SpO2:  [94 %-96 %] 94 % (04/18 1510) Last BM Date: 10/22/20   Laboratory  CBC Recent Labs    10/21/20 0127 10/22/20 0138  WBC 8.6 10.2  HGB 10.6* 11.2*  HCT 32.1* 33.5*  PLT 241 269   BMET Recent Labs    10/21/20 0127 10/22/20 0138  NA 139 136  K 3.9 3.7  CL 110 109  CO2 20* 20*  GLUCOSE 115* 133*  BUN 33* 28*  CREATININE 2.19* 1.86*  CALCIUM 8.7* 8.8*     Physical Exam General appearance: alert and no distress  Right calf -- Splint well fitting, loose around calf. No tenderness under splint rim. Severe pain with superomedial calf palpation several centimeters distant from splint. Toes perfused, no obvious pedal edema.   Assessment/Plan: Right ankle fx -- Given calf pain that does not seem to be associated with splint will get [place] to r/o DVT, CAM boot in meantime so that it can be removed for study. If negative will replace with short leg splint and treat symptomatically.    Lisette Abu, PA-C Orthopedic Surgery [phone] 10/23/2020

## 2020-10-23 NOTE — Progress Notes (Signed)
Physical Therapy Treatment Patient Details Name: [name] MRN: [id number] DOB: [birth year] Today's Date: 10/23/2020    History of Present Illness 75 y.o. female presents to [facility] ED on 10/19/2020 after a fall. Pt found to have R bimalleolar ankle fracture. 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 PMH includes HTN; DM; CVA with resultant L hemiplegia; and macular degeneration.    PT Comments    Pt supine in bed on arrival this session.  Pt continues to require total assistance for transfer from surface to surface.  Family refusing SNF placement.  She will require HHPT at DME listed below.  Plan for follow up tomorrow for slide board transfer.      Follow Up Recommendations  SNF;Supervision/Assistance - 24 hour (family refusing snf and wants home.  Will require HHPT.)     Equipment Recommendations  Hospital bed;Other (comment);3in1 (PT) (hoyer lift and hoyer lift pad, slide board, drop arm bed side commode. Ambulance transport for home.  Daughter refusing hopsital bed.)    Recommendations for Other Services       Precautions / Restrictions Precautions Precautions: Fall;Other (comment) Precaution Comments: R ankle fracture Required Braces or Orthoses: Splint/Cast Splint/Cast: R ankle Restrictions Weight Bearing Restrictions: Yes RLE Weight Bearing: Non weight bearing Other Position/Activity Restrictions: L hemiplegia at baseline.    Mobility  Bed Mobility Overal bed mobility: Needs Assistance Bed Mobility: Rolling;Sidelying to Sit Rolling: Mod assist Sidelying to sit: Max assist       General bed mobility comments: Pt requires verbal cueing throughout bed mobility for sequencing. Assistance for LE advancement and trunk elevation.    Transfers Overall transfer level: Needs assistance Equipment used: None (face to face assistance with L knee blocked.) Transfers: Squat Pivot Transfers     Squat pivot transfers: Total assist;+2 safety/equipment     General  transfer comment: lack ability to stand and required total assistance to lift hips and pivot from surface to surface.  Ambulation/Gait Ambulation/Gait assistance:  (unable.)               Stairs             Wheelchair Mobility    Modified Rankin (Stroke Patients Only)       Balance     Sitting balance-Leahy Scale: Poor Sitting balance - Comments: posterior bias with frequent cueing to weight shift forward.     Standing balance-Leahy Scale: Zero                              Cognition Arousal/Alertness: Awake/alert Behavior During Therapy: WFL for tasks assessed/performed Overall Cognitive Status: Impaired/Different from baseline Area of Impairment: Following commands;Safety/judgement                       Following Commands: Follows one step commands with increased time Safety/Judgement: Decreased awareness of safety;Decreased awareness of deficits            Exercises      General Comments        Pertinent Vitals/Pain Pain Assessment: 0-10 Faces Pain Scale: Hurts whole lot Pain Location: R foot/ankle and R calf Pain Descriptors / Indicators: Crying;Discomfort;Grimacing;Guarding;Spasm;Sharp;Restless Pain Intervention(s): Monitored during session;Repositioned    Home Living                      Prior Function            PT Goals (current goals can  now be found in the care plan section) Acute Rehab PT Goals Patient Stated Goal: To not feel so messed up and to go home. Potential to Achieve Goals: Fair Progress towards PT goals: Progressing toward goals    Frequency    Min 3X/week      PT Plan Current plan remains appropriate    Co-evaluation              AM-PAC PT "6 Clicks" Mobility   Outcome Measure  Help needed turning from your back to your side while in a flat bed without using bedrails?: A Lot Help needed moving from lying on your back to sitting on the side of a flat bed without using  bedrails?: A Lot Help needed moving to and from a bed to a chair (including a wheelchair)?: Total Help needed standing up from a chair using your arms (e.g., wheelchair or bedside chair)?: Total Help needed to walk in hospital room?: Total Help needed climbing 3-5 steps with a railing? : Total 6 Click Score: 8    End of Session Equipment Utilized During Treatment: Gait belt Activity Tolerance: Patient tolerated treatment well Patient left: with call bell/phone within reach;with family/visitor present;in chair;with chair alarm set Nurse Communication: Mobility status;Need for lift equipment PT Visit Diagnosis: Other abnormalities of gait and mobility (R26.89);Muscle weakness (generalized) (M62.81);Hemiplegia and hemiparesis;Pain Hemiplegia - Right/Left: Left Hemiplegia - dominant/non-dominant: Non-dominant Hemiplegia - caused by: Cerebral infarction Pain - Right/Left: Right Pain - part of body: Ankle and joints of foot     Time: 1521-1550 PT Time Calculation (min) (ACUTE ONLY): 29 min  Charges:  $Therapeutic Activity: 23-37 mins                     Erasmo Leventhal , PTA Acute Rehabilitation Services Pager [phone] Office [phone]     Usha Slager Eli Hose 10/23/2020, 5:28 PM

## 2020-10-23 NOTE — Progress Notes (Signed)
PROGRESS NOTE    [name]  [id number] DOB: 09-27-1945 DOA: 10/19/2020 PCP: Orma Flaming, MD   Chief Complain:-Fall  Brief Narrative: Patient is a 75 year old female with history of hypertension, diabetes type 2, CVA with residual left-sided hemiplegia, macular degeneration who presented here with a fall.  She was found to have a right bimalleolar ankle fracture.  Level also showed AKI on CKD stage IIIb.  Orthopedics consulted and she was recommended conservative management, nonweightbearing on the left lower extremity.  PT/OT recommended CIR which has been denied, daughter does not want skilled nursing facility and wants to take her home with home health.   Assessment & Plan:   Principal Problem:   Bimalleolar ankle fracture, right, closed, initial encounter Active Problems:   Diabetes mellitus without complication (HCC)   HTN (hypertension)   Overweight (BMI 25.0-29.9)   Left hemiparesis (HCC)   Stage 4 chronic kidney disease (HCC)   Ankle fracture  Bimalleolar ankle fracture: Secondary to mechanical fall.  Orthopedics evaluated the patient and recommended conservative management.  Splint was put in the emergency department.  Orthopedics recommended nonweightbearing of the left lower extremity.  Orthopedics recommended to follow with Dr. Doran Durand in 2 weeks. Continue pain management, supportive care. PT/OT recommended CIR initially, which was declined by her insurance.  Daughter wants to take her home with home health.  Daughter requested for additional session of PT/OT before going home. Minimize narcotics.  Continue Robaxin for muscle spasms.  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  Creatinine was elevated from baseline on presentation.  Continue gentle IV fluids.  Kidney function is improving with IV fluids.  Hypertension: Has history of chronic hypertension.  On Cozaar at home which was held due to AKI.  Started on metoprolol  and hydralazine.  Monitor blood pressure.  Diabetes type 2: Currently diet controlled.  Hemoglobin A1c of 7.3.  Currently on sliding-scale insulin.  We might consider putting her on metformin 500 mg once a day on discharge.  History of CVA: Has residual left-sided hemiparesis.  On aspirin at home.  Not on a statin.  Pressure Injury Stage I -  Intact skin with non-blanchable redness of a localized area usually over a bony prominence. (Active)     Location: Heel  Location Orientation: Right;Left;Lateral;Medial;Bilateral;Posterior  Staging: Stage I -  Intact skin with non-blanchable redness of a localized area usually over a bony prominence.  Wound Description (Comments):   Present on Admission:          Nutrition Problem: Inadequate oral intake Etiology: lethargy/confusion      DVT prophylaxis:Lovenox Code Status: DNI Family Communication: daughter at bedside Status is: Inpatient  Remains inpatient appropriate because:Inpatient level of care appropriate due to severity of illness   Dispo: The patient is from: Home              Anticipated d/c is to: Home              Patient currently is medically stable to d/c.   Difficult to place patient No    Consultants: Orthopedics  Procedures: Splint placement  Antimicrobials:  Anti-infectives (From admission, onward)   None      Subjective:  Patient seen and examined at the bedside this morning.  Hemodynamically stable.  Overall comfortable.  Daughter was at the bedside.  Still appears weak, mostly bedbound.  Daughter requesting for PT/OT session. Objective: Vitals:   10/22/20 1525 10/22/20 1654 10/22/20 2101 10/23/20 0809  BP: Marland Kitchen)  165/69 (!) 156/76 (!) 144/59 (!) 187/66  Pulse: 74  71 64  Resp: '20  16 17  '$ Temp: 99.1 F (37.3 C)  98.7 F (37.1 C) (!) 97.4 F (36.3 C)  TempSrc: Oral  Oral Oral  SpO2: 95%  96% 96%  Weight:      Height:        Intake/Output Summary (Last 24 hours) at 10/23/2020 0941 Last data  filed at 10/22/2020 1500 Gross per 24 hour  Intake 297.58 ml  Output --  Net 297.58 ml   Filed Weights   10/19/20 0900  Weight: 73 kg    Examination:  General exam: very deconditioned, debilitated, frail, chronically looking HEENT:PERRL,Oral mucosa moist, Ear/Nose normal on gross exam Respiratory system: Bilateral equal air entry, normal vesicular breath sounds, no wheezes or crackles  Cardiovascular system: S1 & S2 heard, RRR. No JVD, murmurs, rubs, gallops or clicks. No pedal edema. Gastrointestinal system: Abdomen is nondistended, soft and nontender. No organomegaly or masses felt. Normal bowel sounds heard. Central nervous system: Alert and awake, left hemiparesis Extremities: No edema, no clubbing ,no cyanosis, splint on the right lower extremity , left upper extremity contracture skin: No rashes, lesions or ulcers,no icterus ,no pallor   Data Reviewed: I have personally reviewed following labs and imaging studies  CBC: Recent Labs  Lab 10/19/20 1036 10/20/20 0137 10/21/20 0127 10/22/20 0138  WBC 9.4 9.3 8.6 10.2  NEUTROABS  --   --  5.5  --   HGB 13.1 11.0* 10.6* 11.2*  HCT 40.2 34.0* 32.1* 33.5*  MCV 88.9 87.2 87.0 84.4  PLT 174 266 241 [id number]   Basic Metabolic Panel: Recent Labs  Lab 10/19/20 1036 10/20/20 0137 10/21/20 0127 10/22/20 0138  NA 137 135 139 136  K 4.2 3.9 3.9 3.7  CL 112* 110 110 109  CO2 15* 21* 20* 20*  GLUCOSE 160* 146* 115* 133*  BUN 36* 35* 33* 28*  CREATININE 2.01* 2.23* 2.19* 1.86*  CALCIUM 8.6* 8.6* 8.7* 8.8*  MG  --   --  1.9 1.8   GFR: Estimated Creatinine Clearance: 24.5 mL/min (A) (by C-G formula based on SCr of 1.86 mg/dL (H)). Liver Function Tests: No results for input(s): AST, ALT, ALKPHOS, BILITOT, PROT, ALBUMIN in the last 168 hours. No results for input(s): LIPASE, AMYLASE in the last 168 hours. No results for input(s): AMMONIA in the last 168 hours. Coagulation Profile: No results for input(s): INR, PROTIME in the  last 168 hours. Cardiac Enzymes: No results for input(s): CKTOTAL, CKMB, CKMBINDEX, TROPONINI in the last 168 hours. BNP (last 3 results) No results for input(s): PROBNP in the last 8760 hours. HbA1C: No results for input(s): HGBA1C in the last 72 hours. CBG: Recent Labs  Lab 10/22/20 0625 10/22/20 1132 10/22/20 1652 10/22/20 2107 10/23/20 0856  GLUCAP 129* 139* 123* 151* 139*   Lipid Profile: No results for input(s): CHOL, HDL, LDLCALC, TRIG, CHOLHDL, LDLDIRECT in the last 72 hours. Thyroid Function Tests: No results for input(s): TSH, T4TOTAL, FREET4, T3FREE, THYROIDAB in the last 72 hours. Anemia Panel: No results for input(s): VITAMINB12, FOLATE, FERRITIN, TIBC, IRON, RETICCTPCT in the last 72 hours. Sepsis Labs: No results for input(s): PROCALCITON, LATICACIDVEN in the last 168 hours.  Recent Results (from the past 240 hour(s))  SARS CORONAVIRUS 2 (TAT 6-24 HRS) Nasopharyngeal Nasopharyngeal Swab     Status: None   Collection Time: 10/19/20  1:06 PM   Specimen: Nasopharyngeal Swab  Result Value Ref Range Status   SARS Coronavirus  2 NEGATIVE NEGATIVE Final    Comment: (NOTE) SARS-CoV-2 target nucleic acids are NOT DETECTED.  The SARS-CoV-2 RNA is generally detectable in upper and lower respiratory specimens during the acute phase of infection. Negative results do not preclude SARS-CoV-2 infection, do not rule out co-infections with other pathogens, and should not be used as the sole basis for treatment or other patient management decisions. Negative results must be combined with clinical observations, patient history, and epidemiological information. The expected result is Negative.  Fact Sheet for Patients: SugarRoll.be  Fact Sheet for Healthcare Providers: https://www.woods-mathews.com/  This test is not yet approved or cleared by the [place] FDA and  has been authorized for detection and/or diagnosis of SARS-CoV-2  by FDA under an Emergency Use Authorization (EUA). This EUA will remain  in effect (meaning this test can be used) for the duration of the COVID-19 declaration under Se ction 564(b)(1) of the Act, 21 U.S.C. section 360bbb-3(b)(1), unless the authorization is terminated or revoked sooner.  Performed at [facility], [place] [address], [place], [place] [address]          Radiology Studies: No results found.      Scheduled Meds: . aspirin EC  81 mg Oral Daily  . enoxaparin (LOVENOX) injection  30 mg Subcutaneous Q24H  . hydrALAZINE  25 mg Oral Q8H  . insulin aspart  0-9 Units Subcutaneous TID WC  . metoprolol succinate  25 mg Oral Daily  . multivitamin with minerals  1 tablet Oral Daily  . polyethylene glycol  17 g Oral Daily  . senna-docusate  1 tablet Oral BID   Continuous Infusions: . lactated ringers 75 mL/hr at 10/23/20 0926     LOS: 1 day    Time spent: 25 mins.More than 50% of that time was spent in counseling and/or coordination of care.      Shelly Coss, MD [facility] P4/18/2022, 9:41 AM

## 2020-10-23 NOTE — Progress Notes (Signed)
Orthopedic Tech Progress Note Patient Details:  [name] [birth year] [id number] Removed splint PER PA  Ortho Devices Type of Ortho Device: CAM walker Splint Material: Fiberglass Ortho Device/Splint Location: RLE Ortho Device/Splint Interventions: Ordered,Application   Post Interventions Patient Tolerated: Well Instructions Provided: Care of device   Janit Pagan 10/23/2020, 5:30 PM

## 2020-10-23 NOTE — TOC CAGE-AID Note (Signed)
Transition of Care [facility]) - CAGE-AID Screening   Patient Details  Name: [name] MRN: [id number] Date of Birth: 28-Jul-1945   Elvina Sidle, RN Trauma Response Nurse Phone Number: 10/23/2020, 12:26 PM      CAGE-AID Screening:    Have You Ever Felt You Ought to Cut Down on Your Drinking or Drug Use?: No Have People Annoyed You By [employer] Your Drinking Or Drug Use?: No Have You Felt Bad Or Guilty About Your Drinking Or Drug Use?: No Have You Ever Had a Drink or Used Drugs First Thing In The Morning to Steady Your Nerves or to Get Rid of a Hangover?: No CAGE-AID Score: 0  Substance Abuse Education Offered: No (denies alcohol/drug use)

## 2020-10-23 NOTE — TOC Progression Note (Addendum)
Transition of Care [facility]) - Progression Note    Patient Details  Name: [name] MRN: [id number] Date of Birth: April 21, 1946  Transition of Care [facility]) CM/SW [place], [place] Phone Number: 10/23/2020, 4:07 PM  Clinical Narrative:     CSW met with pt and pt daughter. Pt wants to go home. Daughter also wants pt to go home but she is very anxious and expresses being quite overwhelmed from pt's hospital stay and taking care of pt previously. Daughter is unable to state clearly whether or not she will be able to take care of pt at home. She explains that she does not feel that she can take care of pt home in her current state but also is firm about pt not going to a nursing home. CSW explains that pt cannot stay in hospital once medically ready and hence why SNF is being recommended. Daughter wants to call pt's OP orthopedist Dr. Doran Durand as she indicates that Dr. Doran Durand previously anticipated pt being non weightbearing for 2 weeks versus MD's here anticipating 6-8 weeks non-weightbearing. She plans to call Dr. Doran Durand prior to making decision.   CSW is informed by PT that pt would need a hoyer lift with sling, drop arm commode, and sliding board if dc'ing home but PT still recommends SNF at this time. If going home, pt would return home living with daughter. Daughter is the only person taking care of mom at this time. Daughter indicates that she is considering hiring private care givers to assist at home as well.   1649: CSW followed up with Pt and pt daughter after PA spoke with them. Daughter explains she wants to see how pt does with PT/OT tomorrow prior to arranging equipment but at this time they do want [facility] and have preference for [facility]. CSW to fax referral.   1652: [facility] unable to accept pt due to Staffing.   Expected Discharge Plan: [place] Barriers to Discharge: Continued Medical Work up  Expected Discharge Plan and Services Expected Discharge  Plan: [place] arrangements for the past 2 months: Single Family Home                                       Social Determinants of Health (SDOH) Interventions    Readmission Risk Interventions No flowsheet data found.

## 2020-10-24 ENCOUNTER — Inpatient Hospital Stay (HOSPITAL_COMMUNITY): Payer: Medicare Other

## 2020-10-24 DIAGNOSIS — M79604 Pain in right leg: Secondary | ICD-10-CM

## 2020-10-24 LAB — BASIC METABOLIC PANEL
Anion gap: 8 (ref 5–15)
BUN: 28 mg/dL — ABNORMAL HIGH (ref 8–23)
CO2: 22 mmol/L (ref 22–32)
Calcium: 8.8 mg/dL — ABNORMAL LOW (ref 8.9–10.3)
Chloride: 109 mmol/L (ref 98–111)
Creatinine, Ser: 1.9 mg/dL — ABNORMAL HIGH (ref 0.44–1.00)
GFR, Estimated: 27 mL/min — ABNORMAL LOW (ref >60)
Glucose, Bld: 123 mg/dL — ABNORMAL HIGH (ref 70–99)
Potassium: 3.9 mmol/L (ref 3.5–5.1)
Sodium: 139 mmol/L (ref 135–145)

## 2020-10-24 LAB — GLUCOSE, CAPILLARY
Glucose-Capillary: 191 mg/dL — ABNORMAL HIGH (ref 70–99)
Glucose-Capillary: 125 mg/dL — ABNORMAL HIGH (ref 70–99)
Glucose-Capillary: 142 mg/dL — ABNORMAL HIGH (ref 70–99)

## 2020-10-24 MED ORDER — METOPROLOL TARTRATE 50 MG PO TABS
50.0000 mg | ORAL_TABLET | Freq: Two times a day (BID) | ORAL | Status: DC
  Administered 2020-10-24 – 2020-10-26 (×4): 50 mg via ORAL
  Filled 2020-10-24 (×5): qty 1

## 2020-10-24 MED ORDER — HYDRALAZINE HCL 50 MG PO TABS: 50.0000 mg | ORAL_TABLET | Freq: Three times a day (TID) | ORAL | Status: DC

## 2020-10-24 NOTE — Progress Notes (Signed)
Occupational Therapy Treatment Patient Details Name: [name] MRN: [id number] DOB: 1946/05/06 Today's Date: 10/24/2020    History of present illness 75 y.o. female [facility] ED on 10/19/2020 after a fall. Pt found to have R bimalleolar ankle fracture. 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 PMH includes HTN; DM; CVA with resultant L hemiplegia; and macular degeneration.   OT comments  Pt showing improvement this session with physical assistance and cognition. Pt was able to complete all bed mobility with mod assistance and once seated EOB, pt was able to maintain her balance after initial support for OT. Pt complete a sliding board transfer to a drop arm chair with mod assist +2 to simulate a toilet transfer, while maintaining NWB precautions. Pt is a great candidate for rehab and will continue to make gains towards independence. OT will continue to follow pt acutely.    Follow Up Recommendations  SNF    Equipment Recommendations  None recommended by OT    Recommendations for Other Services      Precautions / Restrictions Precautions Precautions: Fall;Other (comment) Precaution Comments: R ankle fracture Required Braces or Orthoses: Splint/Cast Splint/Cast: R ankle Restrictions Weight Bearing Restrictions: Yes RLE Weight Bearing: Non weight bearing Other Position/Activity Restrictions: L hemiplegia at baseline.       Mobility Bed Mobility Overal bed mobility: Needs Assistance Bed Mobility: Rolling;Sidelying to Sit;Sit to Supine Rolling: Mod assist Sidelying to sit: Mod assist   Sit to supine: Mod assist   General bed mobility comments: Pt requires verbal cues throughout for sequencing    Transfers Overall transfer level: Needs assistance Equipment used: Sliding board Transfers: Lateral/Scoot Transfers Sit to Stand: Mod assist        Lateral/Scoot Transfers: Mod assist;+2 physical assistance General transfer comment: Pt able to engage core and  follow commands for hand placement.  Pt reaching with R hand and limited in LUE reaching but able to hold to therapist to keep her weight forward.  Pt lacks core strength but more enaged with this method showing rehab potential.    Balance Overall balance assessment: Needs assistance Sitting-balance support: Single extremity supported;Feet supported Sitting balance-Leahy Scale: Fair Sitting balance - Comments: initially pt required support, after 30 secs pt was able to maintain midline and sit EOB with only her RUE intermittently supporting her. Postural control: Posterior lean;Left lateral lean   Standing balance-Leahy Scale: Zero                             ADL either performed or assessed with clinical judgement   ADL Overall ADL's : Needs assistance/impaired Eating/Feeding: Set up;Sitting Eating/Feeding Details (indicate cue type and reason): Pt able to sit and feed her self/give her self a sip of water Grooming: Wash/dry face;Set up;Sitting Grooming Details (indicate cue type and reason): Pt sat EOB and was able to wash her face                 Toilet Transfer: Moderate assistance;+2 for physical assistance;+2 for safety/equipment;Transfer board;BSC;Requires drop arm Toilet Transfer Details (indicate cue type and reason): Pt able to assist with transfer using transfer board and drop arm BSC. This was simulated transferring to drop arm recliner. Toileting- Clothing Manipulation and Hygiene: Moderate assistance;Bed level;Sitting/lateral lean Toileting - Clothing Manipulation Details (indicate cue type and reason): Pt initially required max assist in clean up, rolling, however once seated pt could wash her legs and front peri area.  Functional mobility during ADLs: Moderate assistance;+2 for safety/equipment;+2 for physical assistance General ADL Comments: Pt is able to assist with transfers using a transfer board and verbal cueing to help initiate using her LLE.      Vision       Perception     Praxis      Cognition Arousal/Alertness: Awake/alert Behavior During Therapy: WFL for tasks assessed/performed Overall Cognitive Status: Within Functional Limits for tasks assessed Area of Impairment: Following commands;Safety/judgement                       Following Commands: Follows one step commands with increased time Safety/Judgement: Decreased awareness of safety;Decreased awareness of deficits     General Comments: Pt cognition improved, pt still sleepy, but much more responsive and interactive.        Exercises     Shoulder Instructions       General Comments      Pertinent Vitals/ Pain       Pain Assessment: Faces Faces Pain Scale: Hurts whole lot Pain Location: R foot/ankle and R calf w/ spasm Pain Descriptors / Indicators: Discomfort;Grimacing;Guarding;Spasm;Sharp;Restless Pain Intervention(s): Monitored during session;Repositioned  Home Living                                          Prior Functioning/Environment              Frequency  Min 2X/week        Progress Toward Goals  OT Goals(current goals can now be found in the care plan section)  Progress towards OT goals: Progressing toward goals  Acute Rehab OT Goals Patient Stated Goal: To feel back to normal OT Goal Formulation: With patient/family Time For Goal Achievement: 11/03/20 Potential to Achieve Goals: Good ADL Goals Pt Will Perform Grooming: Independently;sitting Pt Will Perform Lower Body Bathing: with min guard assist;with adaptive equipment;sitting/lateral leans Pt Will Perform Lower Body Dressing: with supervision;sitting/lateral leans;with adaptive equipment Pt Will Transfer to Toilet: with min assist;with transfer board;bedside commode Pt Will Perform Tub/Shower Transfer: Tub transfer;with mod assist;Stand pivot transfer;tub bench  Plan Discharge plan needs to be updated;Frequency remains appropriate     Co-evaluation                 AM-PAC OT "6 Clicks" Daily Activity     Outcome Measure   Help from another person eating meals?: None Help from another person taking care of personal grooming?: A Little Help from another person toileting, which includes using toliet, bedpan, or urinal?: A Lot Help from another person bathing (including washing, rinsing, drying)?: A Lot Help from another person to put on and taking off regular upper body clothing?: A Little Help from another person to put on and taking off regular lower body clothing?: A Lot 6 Click Score: 16    End of Session Equipment Utilized During Treatment: Gait belt;Other (comment) (sliding board)  OT Visit Diagnosis: Unsteadiness on feet (R26.81);Muscle weakness (generalized) (M62.81);Pain Pain - Right/Left: Right Pain - part of body: Ankle and joints of foot   Activity Tolerance Patient limited by pain   Patient Left in chair;with family/visitor present;Other (comment) (PTA in room with pt)   Nurse Communication Mobility status        Time: [id number] OT Time Calculation (min): 32 min  Charges: OT General Charges $OT Visit: 1 Visit OT Treatments $Self Care/Home Management :  23-37 mins  Llewellyn Schoenberger H., OTR/L Acute Rehabilitation  Koraline Phillipson Elane Yolanda Bonine 10/24/2020, 5:20 PM

## 2020-10-24 NOTE — Progress Notes (Signed)
PROGRESS NOTE    [name]  [id number] DOB: 08-25-1945 DOA: 10/19/2020 PCP: Orma Flaming, MD   Chief Complain:-Fall  Brief Narrative: Patient is a 75 year old female with history of hypertension, diabetes type 2, CVA with residual left-sided hemiplegia, macular degeneration who presented here with a fall.  She was found to have a right bimalleolar ankle fracture.  Level also showed AKI on CKD stage IIIb.  Orthopedics consulted and she was recommended conservative management, nonweightbearing on the left lower extremity.  PT/OT recommended CIR which has been denied, daughter was hesitant about SNF and was thinking of home with home health.  Daughter is still undecided.  TOC following.   Assessment & Plan:   Principal Problem:   Bimalleolar ankle fracture, right, closed, initial encounter Active Problems:   Diabetes mellitus without complication (HCC)   HTN (hypertension)   Overweight (BMI 25.0-29.9)   Left hemiparesis (HCC)   Stage 4 chronic kidney disease (HCC)   Ankle fracture  Bimalleolar ankle fracture: Secondary to mechanical fall.  Orthopedics evaluated the patient and recommended conservative management.  Splint was put in the emergency department.  Orthopedics recommended nonweightbearing of the left lower extremity.  Orthopedics recommended to follow with Dr. Doran Durand in 2 weeks. Continue pain management, supportive care. PT/OT recommended CIR initially, which was declined by her insurance.  Daughter wants to take her home with home health but also considering  SNF now.  Minimize narcotics.  Continue Robaxin for muscle spasms. There was concern for left lower extremity swelling, venous Doppler is pending.  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  Creatinine was elevated from baseline on presentation.Fluids D/ced  Hypertension: Has history of chronic hypertension.  On Cozaar at home which was held due to AKI.  Started on  metoprolol   Monitor blood pressure.  Diabetes type 2: Currently diet controlled.  Hemoglobin A1c of 7.3.  Currently on sliding-scale insulin.  We might consider putting her on metformin 500 mg once she is  discharged.  History of CVA: Has residual left-sided hemiparesis.  On aspirin at home.  Not on a statin.  Pressure Injury Stage I -  Intact skin with non-blanchable redness of a localized area usually over a bony prominence. (Active)     Location: Heel  Location Orientation: Right;Left;Lateral;Medial;Bilateral;Posterior  Staging: Stage I -  Intact skin with non-blanchable redness of a localized area usually over a bony prominence.  Wound Description (Comments):   Present on Admission:          Nutrition Problem: Inadequate oral intake Etiology: lethargy/confusion      DVT prophylaxis:Lovenox Code Status: DNI Family Communication: daughter at bedside Status is: Inpatient  Remains inpatient appropriate because:Inpatient level of care appropriate due to severity of illness   Dispo: The patient is from: Home              Anticipated d/c is to: Home vs SnF              Patient currently is medically stable to d/c.   Difficult to place patient No    Consultants: Orthopedics  Procedures: Splint placement  Antimicrobials:  Anti-infectives (From admission, onward)   None      Subjective:  Patient seen and examined the bedside this morning.  Hemodynamically stable during my evaluation.  She was hypertensive this morning.  Daughter was concerned about the blood pressure pills and her blood pressure.  Daughter is still feels she is not ready for taking her  home and again considering skilled facility.  Objective: Vitals:   10/23/20 1938 10/24/20 0349 10/24/20 0500 10/24/20 0731  BP: 137/72 (!) 189/58 (!) 198/59 (!) 171/66  Pulse: 72 71  79  Resp: '20 19  17  '$ Temp: 98.6 F (37 C) 98.6 F (37 C)  98.2 F (36.8 C)  TempSrc: Oral Oral  Oral  SpO2: 96% 96%  94%   Weight:      Height:        Intake/Output Summary (Last 24 hours) at 10/24/2020 1118 Last data filed at 10/23/2020 1500 Gross per 24 hour  Intake 417.5 ml  Output --  Net 417.5 ml   Filed Weights   10/19/20 0900  Weight: 73 kg    Examination:  General exam: very deconditioned, debilitated, frail, chronically looking HEENT:PERRL,Oral mucosa moist, Ear/Nose normal on gross exam Respiratory system: Bilateral equal air entry, normal vesicular breath sounds, no wheezes or crackles  Cardiovascular system: S1 & S2 heard, RRR. No JVD, murmurs, rubs, gallops or clicks. No pedal edema. Gastrointestinal system: Abdomen is nondistended, soft and nontender. No organomegaly or masses felt. Normal bowel sounds heard. Central nervous system: Alert and awake, left hemiparesis Extremities: No significant edema, no clubbing ,no cyanosis, brace on the right lower extremity , left upper extremity contracture skin: No rashes, lesions or ulcers,no icterus ,no pallor   Data Reviewed: I have personally reviewed following labs and imaging studies  CBC: Recent Labs  Lab 10/19/20 1036 10/20/20 0137 10/21/20 0127 10/22/20 0138  WBC 9.4 9.3 8.6 10.2  NEUTROABS  --   --  5.5  --   HGB 13.1 11.0* 10.6* 11.2*  HCT 40.2 34.0* 32.1* 33.5*  MCV 88.9 87.2 87.0 84.4  PLT 174 266 241 [id number]   Basic Metabolic Panel: Recent Labs  Lab 10/19/20 1036 10/20/20 0137 10/21/20 0127 10/22/20 0138 10/24/20 0526  NA 137 135 139 136 139  K 4.2 3.9 3.9 3.7 3.9  CL 112* 110 110 109 109  CO2 15* 21* 20* 20* 22  GLUCOSE 160* 146* 115* 133* 123*  BUN 36* 35* 33* 28* 28*  CREATININE 2.01* 2.23* 2.19* 1.86* 1.90*  CALCIUM 8.6* 8.6* 8.7* 8.8* 8.8*  MG  --   --  1.9 1.8  --    GFR: Estimated Creatinine Clearance: 23.9 mL/min (A) (by C-G formula based on SCr of 1.9 mg/dL (H)). Liver Function Tests: No results for input(s): AST, ALT, ALKPHOS, BILITOT, PROT, ALBUMIN in the last 168 hours. No results for input(s):  LIPASE, AMYLASE in the last 168 hours. No results for input(s): AMMONIA in the last 168 hours. Coagulation Profile: No results for input(s): INR, PROTIME in the last 168 hours. Cardiac Enzymes: No results for input(s): CKTOTAL, CKMB, CKMBINDEX, TROPONINI in the last 168 hours. BNP (last 3 results) No results for input(s): PROBNP in the last 8760 hours. HbA1C: No results for input(s): HGBA1C in the last 72 hours. CBG: Recent Labs  Lab 10/22/20 2107 10/23/20 0856 10/23/20 1119 10/23/20 1608 10/23/20 2125  GLUCAP 151* 139* 126* 146* 137*   Lipid Profile: No results for input(s): CHOL, HDL, LDLCALC, TRIG, CHOLHDL, LDLDIRECT in the last 72 hours. Thyroid Function Tests: No results for input(s): TSH, T4TOTAL, FREET4, T3FREE, THYROIDAB in the last 72 hours. Anemia Panel: No results for input(s): VITAMINB12, FOLATE, FERRITIN, TIBC, IRON, RETICCTPCT in the last 72 hours. Sepsis Labs: No results for input(s): PROCALCITON, LATICACIDVEN in the last 168 hours.  Recent Results (from the past 240 hour(s))  SARS CORONAVIRUS 2 (TAT  6-24 HRS) Nasopharyngeal Nasopharyngeal Swab     Status: None   Collection Time: 10/19/20  1:06 PM   Specimen: Nasopharyngeal Swab  Result Value Ref Range Status   SARS Coronavirus 2 NEGATIVE NEGATIVE Final    Comment: (NOTE) SARS-CoV-2 target nucleic acids are NOT DETECTED.  The SARS-CoV-2 RNA is generally detectable in upper and lower respiratory specimens during the acute phase of infection. Negative results do not preclude SARS-CoV-2 infection, do not rule out co-infections with other pathogens, and should not be used as the sole basis for treatment or other patient management decisions. Negative results must be combined with clinical observations, patient history, and epidemiological information. The expected result is Negative.  Fact Sheet for Patients: SugarRoll.be  Fact Sheet for Healthcare  Providers: https://www.woods-mathews.com/  This test is not yet approved or cleared by the [place] FDA and  has been authorized for detection and/or diagnosis of SARS-CoV-2 by FDA under an Emergency Use Authorization (EUA). This EUA will remain  in effect (meaning this test can be used) for the duration of the COVID-19 declaration under Se ction 564(b)(1) of the Act, 21 U.S.C. section 360bbb-3(b)(1), unless the authorization is terminated or revoked sooner.  Performed at [facility], [place] [address] [place], [place] [address]          Radiology Studies: No results found.      Scheduled Meds: . aspirin EC  81 mg Oral Daily  . enoxaparin (LOVENOX) injection  30 mg Subcutaneous Q24H  . insulin aspart  0-9 Units Subcutaneous TID WC  . metoprolol tartrate  50 mg Oral BID  . polyethylene glycol  17 g Oral Daily  . senna-docusate  1 tablet Oral BID   Continuous Infusions:    LOS: 2 days    Time spent: 25 mins.More than 50% of that time was spent in counseling and/or coordination of care.      Shelly Coss, MD [facility] P4/19/2022, 11:18 AM

## 2020-10-24 NOTE — Progress Notes (Signed)
Right lower extremity venous duplex has been completed. Preliminary results can be found in CV Proc through chart review.   10/24/20 1:42 PM [name] RVT

## 2020-10-24 NOTE — TOC Progression Note (Signed)
Transition of Care [facility]) - Progression Note    Patient Details  Name: [name] MRN: [id number] Date of Birth: 08-09-1945  Transition of Care [facility]) CM/SW [place],  Phone Number: 10/24/2020, 12:02 PM  Clinical Narrative:     CSW met with pt and pt daughter bedside after being notified by MD that they want to explore SNF. They confirm they want to explore SNF as an option and agreeable to SNF workup with [facility] being a preference. Daughter explains that she still wants to see if pt can bare weight on her foot and requests an xray before they decide between [facility] and SNF. CSW explained that would be up to MD. CSW explained that pt is medically ready and that a decision would need to be made between [facility] and SNF. Daughter wants to see how pt does with PT today. CSW completed FL2 and faxed bed requests in the hub.   Expected Discharge Plan: [place] Barriers to Discharge: Continued Medical Work up  Expected Discharge Plan and Services Expected Discharge Plan: [place] arrangements for the past 2 months: Single Family Home                                       Social Determinants of Health (SDOH) Interventions    Readmission Risk Interventions No flowsheet data found.

## 2020-10-24 NOTE — TOC Progression Note (Addendum)
Transition of Care [facility]) - Progression Note    Patient Details  Name: [name] MRN: [id number] Date of Birth: 1946/05/11  Transition of Care [facility]) CM/SW [place], [place] Phone Number: 10/24/2020, 3:30 PM  Clinical Narrative:     CSW provided list of bed offers to pt. Pt will discuss with her daughter. CSW explained that [facility] would not be able to accept pt.  CSW started [place]. Ref# [id number]. A facility will need to be added when chosen.   Expected Discharge Plan: [place] Barriers to Discharge: Continued Medical Work up  Expected Discharge Plan and Services Expected Discharge Plan: [place] arrangements for the past 2 months: Single Family Home                                       Social Determinants of Health (SDOH) Interventions    Readmission Risk Interventions No flowsheet data found.

## 2020-10-24 NOTE — Progress Notes (Signed)
Orthopedic Tech Progress Note Patient Details:  [name] 12-30-1945 [id number] I applied splint and kyra the other tech wrapped splint Ortho Devices Type of Ortho Device: Cotton web roll,Short leg splint,Stirrup splint Splint Material: Fiberglass Ortho Device/Splint Location: RLE Ortho Device/Splint Interventions: Application,Ordered   Post Interventions Patient Tolerated: Well Instructions Provided: Care of device   Janit Pagan 10/24/2020, 4:21 PM

## 2020-10-24 NOTE — NC FL2 (Signed)
[employer] [facility] MEDICAID FL2 LEVEL OF CARE SCREENING TOOL     IDENTIFICATION  Patient Name: [name] Birthdate: May 03, 1946 Sex: female Admission Date (Current Location): 10/19/2020  [facility] and [place] Number:  Herbalist and Address:  [facility], [place] [address] [place], [place] [address]      Provider Number: [id number]  Attending Physician Name and Address:  Shelly Coss, MD  Relative Name and Phone Number:  Hester Mates (Daughter)   [phone] [facility] Phone)    Current Level of Care: Hospital Recommended Level of Care: [place] Prior Approval Number:    Date Approved/Denied:   PASRR Number: [id number]  Discharge Plan: SNF    Current Diagnoses: Patient Active Problem List   Diagnosis Date Noted  . Ankle fracture 10/22/2020  . Bimalleolar ankle fracture, right, closed, initial encounter 10/19/2020  . Cataracts, bilateral 05/03/2020  . Stage 4 chronic kidney disease ([place]) 12/21/2019  . Iron deficiency anemia 02/03/2019  . Pelvic fracture ([place]) 09/21/2018  . Allergic reaction caused by a drug 05/06/2018  . Bilateral leg edema 04/13/2018  . Embolic stroke ([place]) [id number]  . Left hemiparesis ([place])   . Diabetes mellitus type 2 in nonobese (HCC)   . Acute ischemic stroke ([place])   . Left arm weakness   . Overweight (BMI 25.0-29.9) 02/24/2018  . Diabetes mellitus without complication ([place]) 0000000  . HTN (hypertension) 09/02/2017    Orientation RESPIRATION BLADDER Height & Weight     Self,Time,Situation,Place  Normal Incontinent Weight: 161 lb (73 kg) Height:  '5\' 2"'$  (157.5 cm)  BEHAVIORAL SYMPTOMS/MOOD NEUROLOGICAL BOWEL NUTRITION STATUS      Incontinent Diet (see d/c summary)  AMBULATORY STATUS COMMUNICATION OF NEEDS Skin   Extensive Assist Verbally Normal                       Personal Care Assistance Level of Assistance  Bathing,Feeding,Dressing Bathing Assistance: Maximum  assistance Feeding assistance: Independent Dressing Assistance: Limited assistance     Functional Limitations Info  Sight,Hearing,Speech Sight Info: Adequate Hearing Info: Adequate Speech Info: Adequate    SPECIAL CARE FACTORS FREQUENCY  PT (By licensed PT),OT (By licensed OT)     PT Frequency: 5x/week OT Frequency: 5x/week            Contractures Contractures Info: Not present    Additional Factors Info  Code Status,Allergies Code Status Info: partial Allergies Info: Norvasc (amlodipine Besylate), clonadine, lisinopril, Morphine And Related, amlodapine, and codeine           Current Medications (10/24/2020):  This is the current hospital active medication list Current Facility-Administered Medications  Medication Dose Route Frequency Provider Last Rate Last Admin  . acetaminophen (TYLENOL) tablet 650 mg  650 mg Oral Q6H PRN Karmen Bongo, MD   650 mg at 10/24/20 0520   Or  . acetaminophen (TYLENOL) suppository 650 mg  650 mg Rectal Q6H PRN Karmen Bongo, MD      . aspirin EC tablet 81 mg  81 mg Oral Daily Karmen Bongo, MD   81 mg at 10/24/20 0824  . bisacodyl (DULCOLAX) EC tablet 5 mg  5 mg Oral Daily PRN Karmen Bongo, MD      . calcium carbonate (dosed in mg elemental calcium) suspension 500 mg of elemental calcium  500 mg of elemental calcium Oral Q6H PRN Karmen Bongo, MD      . camphor-menthol [facility]) lotion 1 application  1 application Topical [id number] PRN Karmen Bongo, MD  And  . hydrOXYzine (ATARAX/VISTARIL) tablet 25 mg  25 mg Oral Q8H PRN Karmen Bongo, MD   25 mg at 10/19/20 1439  . docusate sodium (ENEMEEZ) enema 283 mg  1 enema Rectal PRN Karmen Bongo, MD      . enoxaparin (LOVENOX) injection 30 mg  30 mg Subcutaneous Q24H Karmen Bongo, MD   30 mg at 10/23/20 1802  . hydrALAZINE (APRESOLINE) injection 10 mg  10 mg Intravenous Q4H PRN Lovey Newcomer T, NP   10 mg at 10/24/20 0337  . HYDROcodone-acetaminophen (NORCO/VICODIN) 5-325 MG per  tablet 1-2 tablet  1-2 tablet Oral Q4H PRN Karmen Bongo, MD   1 tablet at 10/20/20 0615  . insulin aspart (novoLOG) injection 0-9 Units  0-9 Units Subcutaneous TID WC Karmen Bongo, MD   2 Units at 10/24/20 1143  . methocarbamol (ROBAXIN) tablet 500 mg  500 mg Oral Q8H PRN Shelly Coss, MD   500 mg at 10/24/20 0527  . metoprolol tartrate (LOPRESSOR) tablet 50 mg  50 mg Oral BID Shelly Coss, MD      . ondansetron [facility]) tablet 4 mg  4 mg Oral Q6H PRN Karmen Bongo, MD       Or  . ondansetron [facility]) injection 4 mg  4 mg Intravenous Q6H PRN Karmen Bongo, MD   4 mg at 10/19/20 1439  . polyethylene glycol (MIRALAX / GLYCOLAX) packet 17 g  17 g Oral Daily Florencia Reasons, MD   17 g at 10/23/20 0917  . senna-docusate (Senokot-S) tablet 1 tablet  1 tablet Oral BID Florencia Reasons, MD   1 tablet at 10/24/20 [phone]  . sorbitol 70 % solution 30 mL  30 mL Oral PRN Karmen Bongo, MD      . traMADol [facility]) tablet 50 mg  50 mg Oral Q12H PRN Florencia Reasons, MD   50 mg at 10/23/20 2120  . zolpidem (AMBIEN) tablet 5 mg  5 mg Oral QHS PRN Karmen Bongo, MD         Discharge Medications: Please see discharge summary for a list of discharge medications.  Relevant Imaging Results:  Relevant Lab Results:   Additional Information [id number]  [facility], LCSW

## 2020-10-24 NOTE — Plan of Care (Signed)
  Problem: Pain Management: Goal: Pain level will decrease with appropriate interventions Outcome: Progressing   Problem: Activity: Goal: Ability to increase mobility will improve Outcome: Progressing

## 2020-10-24 NOTE — Progress Notes (Signed)
Collected and sent COVID test to lab.

## 2020-10-24 NOTE — Progress Notes (Signed)
Physical Therapy Treatment Patient Details Name: [name] MRN: [id number] DOB: 1946-05-28 Today's Date: 10/24/2020    History of Present Illness 75 y.o. female presents to [facility] ED on 10/19/2020 after a fall. Pt found to have R bimalleolar ankle fracture. 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 PMH includes HTN; DM; CVA with resultant L hemiplegia; and macular degeneration.    PT Comments    Focused on initiation of the slide board transfer this session with daughter present.  Pt showing great rehab potential with slide board level transfer as she lacks ability to stand given her presentation.  Will continue to work on functional transfers with slide board with emphasis on forward weight shifting and hand placement to decrease caregiver assistance.  Pt would benefit from core strengthening next session for improve carryover with transfer from slide board level.    Follow Up Recommendations  SNF;Supervision/Assistance - 24 hour (if family refused snf will require HHPT)     Equipment Recommendations  Hospital bed;Other (comment);3in1 (PT) (hoyer lift/hoyer lift pad/drop arm commode/slide board/daughter refusing hospital bed/ambulance transport home.)    Recommendations for Other Services       Precautions / Restrictions Precautions Precautions: Fall;Other (comment) Precaution Comments: R ankle fracture Required Braces or Orthoses: Splint/Cast Splint/Cast: R ankle Restrictions Weight Bearing Restrictions: Yes RLE Weight Bearing: Non weight bearing Other Position/Activity Restrictions: L hemiplegia at baseline.    Mobility  Bed Mobility Overal bed mobility: Needs Assistance Bed Mobility: Rolling;Sidelying to Sit Rolling: Mod assist Sidelying to sit: Mod assist;+2 for physical assistance       General bed mobility comments: Pt required VCs through out to roll to R and L sides for bed pad placement.  Heavy mod assistance to advance LEs and elevate trunk into a seated  position.    Transfers Overall transfer level: Needs assistance Equipment used: Sliding board Transfers: Lateral/Scoot Transfers          Lateral/Scoot Transfers: Mod assist;+2 physical assistance General transfer comment: Pt able to engage core and follow commands for hand placement.  Pt reaching with R hand and limited in LUE reaching but able to hold to therapist to keep her weight forward.  Performed x 3 reps from bed<>drop arm recliner.  Daughter participated in transfer to bed from recliner.  Pt lacks core strength but more enaged with this method showing rehab potential.  Ambulation/Gait Ambulation/Gait assistance:  (unable.)               Stairs             Wheelchair Mobility    Modified Rankin (Stroke Patients Only)       Balance Overall balance assessment: Needs assistance Sitting-balance support: Single extremity supported;Feet supported Sitting balance-Leahy Scale: Poor Sitting balance - Comments: posterior bias with frequent cueing to weight shift forward. Postural control: Posterior lean;Left lateral lean   Standing balance-Leahy Scale: Zero                              Cognition Arousal/Alertness: Awake/alert Behavior During Therapy: WFL for tasks assessed/performed Overall Cognitive Status: Impaired/Different from baseline Area of Impairment: Following commands;Safety/judgement                       Following Commands: Follows one step commands with increased time Safety/Judgement: Decreased awareness of safety;Decreased awareness of deficits            Exercises  General Comments        Pertinent Vitals/Pain Pain Assessment: 0-10 Faces Pain Scale: Hurts whole lot Pain Location: R foot/ankle and R calf w/ spasm Pain Descriptors / Indicators: Discomfort;Grimacing;Guarding;Spasm;Sharp;Restless Pain Intervention(s): Monitored during session;Repositioned    Home Living                       Prior Function            PT Goals (current goals can now be found in the care plan section) Acute Rehab PT Goals Patient Stated Goal: To not feel so messed up and to go home. Potential to Achieve Goals: Fair Progress towards PT goals: Progressing toward goals    Frequency           PT Plan Current plan remains appropriate    Co-evaluation              AM-PAC PT "6 Clicks" Mobility   Outcome Measure  Help needed turning from your back to your side while in a flat bed without using bedrails?: A Lot Help needed moving from lying on your back to sitting on the side of a flat bed without using bedrails?: A Lot Help needed moving to and from a bed to a chair (including a wheelchair)?: A Lot Help needed standing up from a chair using your arms (e.g., wheelchair or bedside chair)?: Total Help needed to walk in hospital room?: Total Help needed climbing 3-5 steps with a railing? : Total 6 Click Score: 9    End of Session Equipment Utilized During Treatment: Gait belt Activity Tolerance: Patient tolerated treatment well Patient left: with call bell/phone within reach;with family/visitor present;in chair;with chair alarm set Nurse Communication: Mobility status;Need for lift equipment PT Visit Diagnosis: Other abnormalities of gait and mobility (R26.89);Muscle weakness (generalized) (M62.81);Hemiplegia and hemiparesis;Pain Hemiplegia - Right/Left: Left Hemiplegia - dominant/non-dominant: Non-dominant Hemiplegia - caused by: Cerebral infarction Pain - Right/Left: Right Pain - part of body: Ankle and joints of foot     Time: [id number] PT Time Calculation (min) (ACUTE ONLY): 11 min  Charges:  $Therapeutic Activity: 8-22 mins                     [name] , PTA Acute Rehabilitation Services Pager [phone] Office [phone]     [name] 10/24/2020, 4:42 PM

## 2020-10-24 NOTE — Plan of Care (Signed)
  Problem: Education: Goal: Ability to describe self-care measures that may prevent or decrease complications (Diabetes Survival Skills Education) will improve Outcome: Progressing Goal: Individualized Educational Video(s) Outcome: Progressing   Problem: Coping: Goal: Ability to adjust to condition or change in health will improve Outcome: Progressing   Problem: Fluid Volume: Goal: Ability to maintain a balanced intake and output will improve Outcome: Progressing   Problem: Health Behavior/Discharge Planning: Goal: Ability to identify and utilize available resources and services will improve Outcome: Progressing Goal: Ability to manage health-related needs will improve Outcome: Progressing   Problem: Metabolic: Goal: Ability to maintain appropriate glucose levels will improve Outcome: Progressing   Problem: Nutritional: Goal: Maintenance of adequate nutrition will improve Outcome: Progressing Goal: Progress toward achieving an optimal weight will improve Outcome: Progressing   Problem: Skin Integrity: Goal: Risk for impaired skin integrity will decrease Outcome: Progressing   Problem: Tissue Perfusion: Goal: Adequacy of tissue perfusion will improve Outcome: Progressing   Problem: Education: Goal: Knowledge of the prescribed therapeutic regimen will improve Outcome: Progressing   Problem: Activity: Goal: Ability to increase mobility will improve Outcome: Progressing   Problem: Physical Regulation: Goal: Postoperative complications will be avoided or minimized Outcome: Progressing   Problem: Pain Management: Goal: Pain level will decrease with appropriate interventions Outcome: Progressing   Problem: Skin Integrity: Goal: Will show signs of wound healing Outcome: Progressing   Problem: Education: Goal: Knowledge of General Education information will improve Description: Including pain rating scale, medication(s)/side effects and non-pharmacologic comfort  measures Outcome: Progressing   Problem: Health Behavior/Discharge Planning: Goal: Ability to manage health-related needs will improve Outcome: Progressing   Problem: Clinical Measurements: Goal: Ability to maintain clinical measurements within normal limits will improve Outcome: Progressing Goal: Will remain free from infection Outcome: Progressing Goal: Diagnostic test results will improve Outcome: Progressing Goal: Respiratory complications will improve Outcome: Progressing Goal: Cardiovascular complication will be avoided Outcome: Progressing   Problem: Activity: Goal: Risk for activity intolerance will decrease Outcome: Progressing   Problem: Nutrition: Goal: Adequate nutrition will be maintained Outcome: Progressing   Problem: Coping: Goal: Level of anxiety will decrease Outcome: Progressing   Problem: Elimination: Goal: Will not experience complications related to bowel motility Outcome: Progressing Goal: Will not experience complications related to urinary retention Outcome: Progressing   Problem: Pain Managment: Goal: General experience of comfort will improve Outcome: Progressing   Problem: Safety: Goal: Ability to remain free from injury will improve Outcome: Progressing   Problem: Skin Integrity: Goal: Risk for impaired skin integrity will decrease Outcome: Progressing

## 2020-10-25 LAB — GLUCOSE, CAPILLARY
Glucose-Capillary: 145 mg/dL — ABNORMAL HIGH (ref 70–99)
Glucose-Capillary: 128 mg/dL — ABNORMAL HIGH (ref 70–99)
Glucose-Capillary: 135 mg/dL — ABNORMAL HIGH (ref 70–99)
Glucose-Capillary: 140 mg/dL — ABNORMAL HIGH (ref 70–99)

## 2020-10-25 LAB — RESP PANEL BY RT-PCR (FLU A&B, COVID) ARPGX2
Influenza A by PCR: NEGATIVE
Influenza B by PCR: NEGATIVE
SARS Coronavirus 2 by RT PCR: NEGATIVE

## 2020-10-25 MED ORDER — LOSARTAN POTASSIUM 50 MG PO TABS
25.0000 mg | ORAL_TABLET | Freq: Two times a day (BID) | ORAL | Status: DC
  Administered 2020-10-25 – 2020-10-26 (×2): 25 mg via ORAL
  Filled 2020-10-25 (×2): qty 1

## 2020-10-25 MED ORDER — HYDRALAZINE HCL 50 MG PO TABS: 50.0000 mg | ORAL_TABLET | Freq: Three times a day (TID) | ORAL | Status: DC

## 2020-10-25 MED ORDER — LOSARTAN POTASSIUM 50 MG PO TABS
25.0000 mg | ORAL_TABLET | Freq: Every day | ORAL | Status: DC
  Administered 2020-10-25: 25 mg via ORAL
  Filled 2020-10-25: qty 1

## 2020-10-25 NOTE — Progress Notes (Signed)
Occupational Therapy Treatment Patient Details Name: [name] MRN: [id number] DOB: Feb 10, 1946 Today's Date: 10/25/2020    History of present illness 75 y.o. female presents to [facility] ED on 10/19/2020 after a fall. Pt found to have R bimalleolar ankle fracture. 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 PMH includes HTN; DM; CVA with resultant L hemiplegia; and macular degeneration.   OT comments  Pt making progress towards OT goals. Pt able to complete all self care activities seated with no physical assist at this time. Pt and family were educated on the use of the sliding board and hoyer lift. Additionally pt and family were educated on compensatory techniques to ensure safety and higher levels of independence. Pt will continue to benefir from therapy to continue progressing towards higher levels of independence. Acute OT to continue following.    Follow Up Recommendations  Home health OT (pt/family choosing HH)    Equipment Recommendations  3 in 1 bedside commode;Hospital bed;Other (comment) (transfer board)    Recommendations for Other Services      Precautions / Restrictions Precautions Precautions: Fall;Other (comment) Precaution Comments: R ankle fracture Required Braces or Orthoses: Splint/Cast Splint/Cast: R ankle Restrictions Weight Bearing Restrictions: Yes RLE Weight Bearing: Non weight bearing Other Position/Activity Restrictions: L hemiplegia at baseline.       Mobility Bed Mobility Overal bed mobility: Needs Assistance Bed Mobility: Rolling Rolling: Mod assist Sidelying to sit: Mod assist       General bed mobility comments: Pt up in recliner on arrival    Transfers Overall transfer level: Needs assistance Equipment used: None (opted to not use sliding board this session and focus on squat pivot transfers.) Transfers: Squat Pivot Transfers     Squat pivot transfers: Max assist;+2 safety/equipment;From elevated surface (performed from various  surface heights with daughter participating in transfers.)     General transfer comment: Pt did not transfer this session.    Balance Overall balance assessment: Needs assistance Sitting-balance support: Single extremity supported;Feet supported Sitting balance-Leahy Scale: Fair Sitting balance - Comments: initially pt required support, after 30 secs pt was able to maintain midline and sit EOB with only her RUE intermittently supporting her.     Standing balance-Leahy Scale: Zero Standing balance comment: Pt unable to come to a full stand with Max A                           ADL either performed or assessed with clinical judgement   ADL Overall ADL's : Needs assistance/impaired Eating/Feeding: Modified independent;Sitting Eating/Feeding Details (indicate cue type and reason): Pt able to sit and feed her self/give her self a sip of water Grooming: Wash/dry hands;Modified independent;Sitting Grooming Details (indicate cue type and reason): Pt can complete grooming sitting                               General ADL Comments: Pt remained in recliner for this session. Session focused on education of DME and AE that can be used to assist with increasing pt independence.     Vision       Perception     Praxis      Cognition Arousal/Alertness: Awake/alert Behavior During Therapy: WFL for tasks assessed/performed Overall Cognitive Status: Within Functional Limits for tasks assessed Area of Impairment: Following commands;Safety/judgement  Following Commands: Follows one step commands with increased time Safety/Judgement: Decreased awareness of safety;Decreased awareness of deficits     General Comments: Pt cognition much improved, increased alertness throughout session.        Exercises     Shoulder Instructions       General Comments VSS on RA, edema in RLE improved    Pertinent Vitals/ Pain       Pain Assessment:  No/denies pain Pain Score: 6  Pain Location: R foot/ankle and R calf w/ spasm Pain Descriptors / Indicators: Discomfort;Grimacing;Guarding;Spasm;Sharp;Restless Pain Intervention(s): Monitored during session;Repositioned  Home Living                                          Prior Functioning/Environment              Frequency  Min 2X/week        Progress Toward Goals  OT Goals(current goals can now be found in the care plan section)  Progress towards OT goals: Progressing toward goals  Acute Rehab OT Goals Patient Stated Goal: To feel back to normal OT Goal Formulation: With patient/family Time For Goal Achievement: 11/03/20 Potential to Achieve Goals: Good ADL Goals Pt Will Perform Grooming: Independently;sitting Pt Will Perform Lower Body Bathing: with min guard assist;with adaptive equipment;sitting/lateral leans Pt Will Perform Lower Body Dressing: with supervision;sitting/lateral leans;with adaptive equipment Pt Will Transfer to Toilet: with min assist;with transfer board;bedside commode Pt Will Perform Tub/Shower Transfer: Tub transfer;with mod assist;Stand pivot transfer;tub bench  Plan Discharge plan remains appropriate;Frequency remains appropriate    Co-evaluation                 AM-PAC OT "6 Clicks" Daily Activity     Outcome Measure   Help from another person eating meals?: None Help from another person taking care of personal grooming?: None Help from another person toileting, which includes using toliet, bedpan, or urinal?: A Lot Help from another person bathing (including washing, rinsing, drying)?: A Lot Help from another person to put on and taking off regular upper body clothing?: A Little Help from another person to put on and taking off regular lower body clothing?: A Lot 6 Click Score: 17    End of Session    OT Visit Diagnosis: Unsteadiness on feet (R26.81);Muscle weakness (generalized) (M62.81);Pain Pain -  Right/Left: Right Pain - part of body: Ankle and joints of foot   Activity Tolerance     Patient Left     Nurse Communication          Time: [id number] OT Time Calculation (min): 37 min  Charges: OT General Charges $OT Visit: 1 Visit OT Treatments $Self Care/Home Management : 23-37 mins  [name] Roberg H., OTR/L Acute Rehabilitation  [name] 10/25/2020, 3:47 PM

## 2020-10-25 NOTE — Plan of Care (Signed)
  Problem: Physical Regulation: Goal: Postoperative complications will be avoided or minimized Outcome: Progressing   Problem: Pain Management: Goal: Pain level will decrease with appropriate interventions Outcome: Progressing   Problem: Activity: Goal: Ability to increase mobility will improve Outcome: Progressing

## 2020-10-25 NOTE — Progress Notes (Signed)
Nutrition Follow Up  DOCUMENTATION CODES:   Not applicable  INTERVENTION:   Liberalized diet to REGULAR   MVI daily   Double protein portions at meals  NUTRITION DIAGNOSIS:   Inadequate oral intake related to lethargy/confusion as evidenced by per patient/family report.  Ongoing  GOAL:   Patient will meet greater than or equal to 90% of their needs   Progressing   MONITOR:   PO intake,Supplement acceptance,Weight trends,Labs,I & O's  REASON FOR ASSESSMENT:   Consult Assessment of nutrition requirement/status  ASSESSMENT:   75 yo female with a PMH of HTN, T2DM, CKD stage 4, CVA with resulting L hemiplegia, and macular degeneration who presents with a miballeolar R ankle fracture, closed.   Intake progressing per daughter. Consumed breakfast this am without complication. RD observed untouched lunch tray at bedside, patient sleeping at that time. No meal completions documented. Daughter reports patient was unable to tolerate Magic Cup and does not wish to try other supplement. Continue to encourage high protein food options to promote wound healing.   Plan to d/c home with home health.   Admission weight: 73 kg (need recent weight)  Medications: SS novolog, miralax, senokot  Labs: CBG 125-191  Diet Order:   Diet Order            Diet Carb Modified Fluid consistency: Thin; Room service appropriate? Yes  Diet effective now                 EDUCATION NEEDS:   Not appropriate for education at this time  Skin:  Skin Assessment: Reviewed RN Assessment  Stage I: L heel  Last BM:  4/18  Height:   Ht Readings from Last 1 Encounters:  10/19/20 '5\' 2"'$  (1.575 m)    Weight:   Wt Readings from Last 1 Encounters:  10/19/20 73 kg    BMI:  Body mass index is 29.45 kg/m.  Estimated Nutritional Needs:   Kcal:  1700-1900 kcal  Protein:  85-100 grams  Fluid:  >/= 1.7 L/day  Mariana Single RD, LDN Clinical Nutrition Pager listed in [place]

## 2020-10-25 NOTE — Progress Notes (Signed)
Daughter requested night shift nurse not to give Metoprolol tonight if systolic is [id number] or less and no colace tonight. Will inform on coming nurse.

## 2020-10-25 NOTE — Progress Notes (Addendum)
Physical Therapy Treatment Patient Details Name: [name] MRN: [id number] DOB: 04-15-1946 Today's Date: 10/25/2020    History of Present Illness 75 y.o. female presents to [facility] ED on 10/19/2020 after a fall. Pt found to have R bimalleolar ankle fracture. 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 PMH includes HTN; DM; CVA with resultant L hemiplegia; and macular degeneration.    PT Comments    Pt supine in bed.  Assisted with pericare and bed mobility to move to edge of bed and prepare for transfer.  Daughter expressed wanting to participate in transfer from bed to recliner.  Pt required assistance to move forward and hold to her daughter to facilitate forward transfer of weight.  PTA educated daughter on blocking L knee and moving patient from surface to surface.  Performed multiple reps for practice.  Also educated on how to apply a hoyer pad and mechanically lift patient to commode if needed.  Pt will need HHPT and DME listed below as family is now choosing to d/c home.    Follow Up Recommendations  SNF;Supervision/Assistance - 24 hour (family refusing placement and wants home will require HHPT.)     [facility] bed;Other (comment);3in1 (PT) (hoyer lift/hoyer lift pad/long slide board/ EMS transport home.)    Recommendations for Other Services       Precautions / Restrictions Precautions Precautions: Fall;Other (comment) Precaution Comments: R ankle fracture Required Braces or Orthoses: Splint/Cast Splint/Cast: R ankle Restrictions Weight Bearing Restrictions: Yes RLE Weight Bearing: Non weight bearing Other Position/Activity Restrictions: L hemiplegia at baseline.    Mobility  Bed Mobility Overal bed mobility: Needs Assistance Bed Mobility: Rolling Rolling: Mod assist Sidelying to sit: Mod assist       General bed mobility comments: Pt performed rolling to R and L side this session for pericare.  Pt able to follow commands to reach and roll.   Mod assistance to advance LEs and elevate trunk into a seated position.       Transfers Overall transfer level: Needs assistance Equipment used: None (opted to not use sliding board this session and focus on squat pivot transfers.) Transfers: Squat Pivot Transfers     Squat pivot transfers: Max assist;+2 safety/equipment;From elevated surface (performed from various surface heights with daughter participating in transfers.)     General transfer comment: Pt following comands to forward weight shift to prepare for squat pivot transfer with L knee blocked and use of gt belt to lift from surface to surface.  Ambulation/Gait Ambulation/Gait assistance:  (unable.)               Stairs             Wheelchair Mobility    Modified Rankin (Stroke Patients Only)       Balance Overall balance assessment: Needs assistance Sitting-balance support: Single extremity supported;Feet supported Sitting balance-Leahy Scale: Fair Sitting balance - Comments: initially pt required support, after 30 secs pt was able to maintain midline and sit EOB with only her RUE intermittently supporting her.     Standing balance-Leahy Scale: Zero Standing balance comment: Pt unable to come to a full stand with Max A                            Cognition Arousal/Alertness: Awake/alert Behavior During Therapy: WFL for tasks assessed/performed Overall Cognitive Status: Within Functional Limits for tasks assessed Area of Impairment: Following commands;Safety/judgement  Following Commands: Follows one step commands with increased time Safety/Judgement: Decreased awareness of safety;Decreased awareness of deficits     General Comments: Pt cognition much improved, increased alertness throughout session.      Exercises      General Comments General comments (skin integrity, edema, etc.): VSS on RA, edema in RLE improved      Pertinent Vitals/Pain  Pain Assessment: No/denies pain Pain Score: 6  Pain Location: R foot/ankle and R calf w/ spasm Pain Descriptors / Indicators: Discomfort;Grimacing;Guarding;Spasm;Sharp;Restless Pain Intervention(s): Monitored during session;Repositioned    Home Living                      Prior Function            PT Goals (current goals can now be found in the care plan section) Acute Rehab PT Goals Patient Stated Goal: To feel back to normal Potential to Achieve Goals: Fair Progress towards PT goals: Progressing toward goals    Frequency    Min 3X/week      PT Plan Current plan remains appropriate    Co-evaluation              AM-PAC PT "6 Clicks" Mobility   Outcome Measure  Help needed turning from your back to your side while in a flat bed without using bedrails?: A Lot Help needed moving from lying on your back to sitting on the side of a flat bed without using bedrails?: A Lot Help needed moving to and from a bed to a chair (including a wheelchair)?: A Lot Help needed standing up from a chair using your arms (e.g., wheelchair or bedside chair)?: Total Help needed to walk in hospital room?: Total Help needed climbing 3-5 steps with a railing? : Total 6 Click Score: 9    End of Session Equipment Utilized During Treatment: Gait belt Activity Tolerance: Patient tolerated treatment well Patient left: with call bell/phone within reach;with family/visitor present;in chair;with chair alarm set Nurse Communication: Mobility status;Need for lift equipment PT Visit Diagnosis: Other abnormalities of gait and mobility (R26.89);Muscle weakness (generalized) (M62.81);Hemiplegia and hemiparesis;Pain Hemiplegia - Right/Left: Left Hemiplegia - dominant/non-dominant: Non-dominant Hemiplegia - caused by: Cerebral infarction Pain - Right/Left: Right Pain - part of body: Ankle and joints of foot     Time: 1309-1340 PT Time Calculation (min) (ACUTE ONLY): 31 min  Charges:   $Therapeutic Activity: 8-22 mins $Self Care/Home Management: 8-22                     Erasmo Leventhal , PTA Acute Rehabilitation Services Pager [phone] Office [phone]     [name] 10/25/2020, 3:53 PM

## 2020-10-25 NOTE — Progress Notes (Signed)
PROGRESS NOTE    [name]  [id number] DOB: 1946/06/23 DOA: 10/19/2020 PCP: Orma Flaming, MD   Chief Complain:-Fall  Brief Narrative: Patient is a 75 year old female with history of hypertension, diabetes type 2, CVA with residual left-sided hemiplegia, macular degeneration who presented here with a fall.  She was found to have a right bimalleolar ankle fracture.  Level also showed AKI on CKD stage IIIb.  Orthopedics consulted and she was recommended conservative management, nonweightbearing on the left lower extremity.  PT/OT recommended CIR which has been denied, plan for SNF TOC following.  Patient is medically stable for discharge to skilled nursing facility as soon as bed is available.   Assessment & Plan:   Principal Problem:   Bimalleolar ankle fracture, right, closed, initial encounter Active Problems:   Diabetes mellitus without complication (HCC)   HTN (hypertension)   Overweight (BMI 25.0-29.9)   Left hemiparesis (HCC)   Stage 4 chronic kidney disease (HCC)   Ankle fracture  Bimalleolar ankle fracture: Secondary to mechanical fall.  Orthopedics evaluated the patient and recommended conservative management.  Splint was put in the emergency department.  Orthopedics recommended nonweightbearing of the left lower extremity.  Orthopedics recommended to follow with Dr. Doran Durand in 2 weeks. Continue pain management, supportive care. Plan for SNF  Minimize narcotics.  Continue Robaxin for muscle spasms. There was concern for left lower extremity swelling, venous Doppler negative for DVT.  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  Creatinine was elevated from baseline on presentation.Fluids D/ced  Hypertension: Resistant ans severe.Has history of chronic hypertension.  On Cozaar at home which was held due to AKI,now restarted   Also on metoprolol.  She is allergic to amlodipine.  Daughter says she cannot tolerate hydralazine or  clonidine.  Few options.  Continue IV medication as needed  for severe hypertension  Diabetes type 2: Currently diet controlled.  Hemoglobin A1c of 7.3.  Currently on sliding-scale insulin.  We might consider putting her on metformin 500 mg once she is  discharged.  History of CVA: Has residual left-sided hemiparesis.  On aspirin at home.  Not on a statin.  Pressure Injury Stage I -  Intact skin with non-blanchable redness of a localized area usually over a bony prominence. (Active)     Location: Heel  Location Orientation: Right;Left;Lateral;Medial;Bilateral;Posterior  Staging: Stage I -  Intact skin with non-blanchable redness of a localized area usually over a bony prominence.  Wound Description (Comments):   Present on Admission:          Nutrition Problem: Inadequate oral intake Etiology: lethargy/confusion      DVT prophylaxis:Lovenox Code Status: DNI Family Communication: daughter at bedside Status is: Inpatient  Remains inpatient appropriate because:Inpatient level of care appropriate due to severity of illness   Dispo: The patient is from: Home              Anticipated d/c is to:  SnF              Patient currently is medically stable to d/c.   Difficult to place patient No    Consultants: Orthopedics  Procedures: Splint placement  Antimicrobials:  Anti-infectives (From admission, onward)   None      Subjective:  Patient seen and examined the bedside this morning.  Hypertensive this morning.  Patient looks very comfortable, denies any complaints.  Extensive discussion held at the bedside about blood pressure control/medications.  Daughter hesitant on putting her on multiple medications.  Explained thoroughly that a single blood pressure medication is unlikely to control her blood pressure.  Daughter says, the patient cannot tolerate hydralazine or clonidine.  Objective: Vitals:   10/24/20 1502 10/24/20 2010 10/25/20 0613 10/25/20 0840  BP: (!) 179/75  (!) 186/74 (!) 190/76 (!) 186/69  Pulse: 71  69 68  Resp: '17 18 18 18  '$ Temp: 98.9 F (37.2 C) 98.8 F (37.1 C) 98.3 F (36.8 C) 97.8 F (36.6 C)  TempSrc: Oral Oral Oral Oral  SpO2: 96% 95% 100% 96%  Weight:      Height:       No intake or output data in the 24 hours ending 10/25/20 1134 Filed Weights   10/19/20 0900  Weight: 73 kg    Examination:   General exam: Overall comfortable, not in distress, pleasant female HEENT: PERRL Respiratory system:  no wheezes or crackles  Cardiovascular system: S1 & S2 heard, RRR.  Gastrointestinal system: Abdomen is nondistended, soft and nontender. Central nervous system: Alert and oriented Extremities: No edema, no clubbing ,no cyanosis, brace on the right lower extremity, left upper extremity contracture Skin: No rashes, no ulcers,no icterus    Data Reviewed: I have personally reviewed following labs and imaging studies  CBC: Recent Labs  Lab 10/19/20 1036 10/20/20 0137 10/21/20 0127 10/22/20 0138  WBC 9.4 9.3 8.6 10.2  NEUTROABS  --   --  5.5  --   HGB 13.1 11.0* 10.6* 11.2*  HCT 40.2 34.0* 32.1* 33.5*  MCV 88.9 87.2 87.0 84.4  PLT 174 266 241 [id number]   Basic Metabolic Panel: Recent Labs  Lab 10/19/20 1036 10/20/20 0137 10/21/20 0127 10/22/20 0138 10/24/20 0526  NA 137 135 139 136 139  K 4.2 3.9 3.9 3.7 3.9  CL 112* 110 110 109 109  CO2 15* 21* 20* 20* 22  GLUCOSE 160* 146* 115* 133* 123*  BUN 36* 35* 33* 28* 28*  CREATININE 2.01* 2.23* 2.19* 1.86* 1.90*  CALCIUM 8.6* 8.6* 8.7* 8.8* 8.8*  MG  --   --  1.9 1.8  --    GFR: Estimated Creatinine Clearance: 23.9 mL/min (A) (by C-G formula based on SCr of 1.9 mg/dL (H)). Liver Function Tests: No results for input(s): AST, ALT, ALKPHOS, BILITOT, PROT, ALBUMIN in the last 168 hours. No results for input(s): LIPASE, AMYLASE in the last 168 hours. No results for input(s): AMMONIA in the last 168 hours. Coagulation Profile: No results for input(s): INR, PROTIME in the  last 168 hours. Cardiac Enzymes: No results for input(s): CKTOTAL, CKMB, CKMBINDEX, TROPONINI in the last 168 hours. BNP (last 3 results) No results for input(s): PROBNP in the last 8760 hours. HbA1C: No results for input(s): HGBA1C in the last 72 hours. CBG: Recent Labs  Lab 10/23/20 2125 10/24/20 1117 10/24/20 1638 10/24/20 2011 10/25/20 0651  GLUCAP 137* 191* 125* 142* 145*   Lipid Profile: No results for input(s): CHOL, HDL, LDLCALC, TRIG, CHOLHDL, LDLDIRECT in the last 72 hours. Thyroid Function Tests: No results for input(s): TSH, T4TOTAL, FREET4, T3FREE, THYROIDAB in the last 72 hours. Anemia Panel: No results for input(s): VITAMINB12, FOLATE, FERRITIN, TIBC, IRON, RETICCTPCT in the last 72 hours. Sepsis Labs: No results for input(s): PROCALCITON, LATICACIDVEN in the last 168 hours.  Recent Results (from the past 240 hour(s))  SARS CORONAVIRUS 2 (TAT 6-24 HRS) Nasopharyngeal Nasopharyngeal Swab     Status: None   Collection Time: 10/19/20  1:06 PM   Specimen: Nasopharyngeal Swab  Result Value Ref Range Status  SARS Coronavirus 2 NEGATIVE NEGATIVE Final    Comment: (NOTE) SARS-CoV-2 target nucleic acids are NOT DETECTED.  The SARS-CoV-2 RNA is generally detectable in upper and lower respiratory specimens during the acute phase of infection. Negative results do not preclude SARS-CoV-2 infection, do not rule out co-infections with other pathogens, and should not be used as the sole basis for treatment or other patient management decisions. Negative results must be combined with clinical observations, patient history, and epidemiological information. The expected result is Negative.  Fact Sheet for Patients: SugarRoll.be  Fact Sheet for Healthcare Providers: https://www.woods-mathews.com/  This test is not yet approved or cleared by the [place] FDA and  has been authorized for detection and/or diagnosis of SARS-CoV-2  by FDA under an Emergency Use Authorization (EUA). This EUA will remain  in effect (meaning this test can be used) for the duration of the COVID-19 declaration under Se ction 564(b)(1) of the Act, 21 U.S.C. section 360bbb-3(b)(1), unless the authorization is terminated or revoked sooner.  Performed at [facility], [place] [address] [place], [place] [address]   Resp Panel by RT-PCR (Flu A&B, Covid) Nasopharyngeal Swab     Status: None   Collection Time: 10/24/20 12:28 AM   Specimen: Nasopharyngeal Swab; Nasopharyngeal(NP) swabs in vial transport medium  Result Value Ref Range Status   SARS Coronavirus 2 by RT PCR NEGATIVE NEGATIVE Final    Comment: (NOTE) SARS-CoV-2 target nucleic acids are NOT DETECTED.  The SARS-CoV-2 RNA is generally detectable in upper respiratory specimens during the acute phase of infection. The lowest concentration of SARS-CoV-2 viral copies this assay can detect is 138 copies/mL. A negative result does not preclude SARS-Cov-2 infection and should not be used as the sole basis for treatment or other patient management decisions. A negative result may occur with  improper specimen collection/handling, submission of specimen other than nasopharyngeal swab, presence of viral mutation(s) within the areas targeted by this assay, and inadequate number of viral copies(<138 copies/mL). A negative result must be combined with clinical observations, patient history, and epidemiological information. The expected result is Negative.  Fact Sheet for Patients:  EntrepreneurPulse.com.au  Fact Sheet for Healthcare Providers:  IncredibleEmployment.be  This test is no t yet approved or cleared by the [place] FDA and  has been authorized for detection and/or diagnosis of SARS-CoV-2 by FDA under an Emergency Use Authorization (EUA). This EUA will remain  in effect (meaning this test can be used) for the duration of the COVID-19  declaration under Section 564(b)(1) of the Act, 21 U.S.C.section 360bbb-3(b)(1), unless the authorization is terminated  or revoked sooner.       Influenza A by PCR NEGATIVE NEGATIVE Final   Influenza B by PCR NEGATIVE NEGATIVE Final    Comment: (NOTE) The Xpert Xpress SARS-CoV-2/FLU/RSV plus assay is intended as an aid in the diagnosis of influenza from Nasopharyngeal swab specimens and should not be used as a sole basis for treatment. Nasal washings and aspirates are unacceptable for Xpert Xpress SARS-CoV-2/FLU/RSV testing.  Fact Sheet for Patients: EntrepreneurPulse.com.au  Fact Sheet for Healthcare Providers: IncredibleEmployment.be  This test is not yet approved or cleared by the [place] FDA and has been authorized for detection and/or diagnosis of SARS-CoV-2 by FDA under an Emergency Use Authorization (EUA). This EUA will remain in effect (meaning this test can be used) for the duration of the COVID-19 declaration under Section 564(b)(1) of the Act, 21 U.S.C. section 360bbb-3(b)(1), unless the authorization is terminated or revoked.  Performed at  [facility], [place] [address], [place], [place] [address]          Radiology Studies: DG Ankle Complete Right  Result Date: 10/24/2020 CLINICAL DATA:  Assess right ankle fractures EXAM: RIGHT ANKLE - COMPLETE 3+ VIEW COMPARISON:  10/19/2020 FINDINGS: Redemonstration of mildly displaced medial and lateral malleolar fractures without appreciable change in alignment compared to the previous study. No periosteal new bone formation at this time. No definite posterior malleolar fracture. Ankle mortise remains aligned without dislocation. No new fractures are identified. Bones are slightly demineralized. Improving soft tissue swelling. Vascular calcifications are present. IMPRESSION: Mildly displaced medial and lateral malleolar fractures without appreciable change in alignment.  Electronically Signed   By: Davina Poke D.O.   On: 10/24/2020 16:08   VAS [place] LOWER EXTREMITY VENOUS (DVT)  Result Date: 10/24/2020  Lower Venous DVT Study Indications: Pain.  Risk Factors: None identified. Comparison Study: No prior studies. Performing Technologist: Oliver Hum RVT  Examination Guidelines: A complete evaluation includes B-mode imaging, spectral Doppler, color Doppler, and power Doppler as needed of all accessible portions of each vessel. Bilateral testing is considered an integral part of a complete examination. Limited examinations for reoccurring indications may be performed as noted. The reflux portion of the exam is performed with the patient in reverse Trendelenburg.  +---------+---------------+---------+-----------+----------+--------------+ RIGHT    CompressibilityPhasicitySpontaneityPropertiesThrombus Aging +---------+---------------+---------+-----------+----------+--------------+ CFV      Full           Yes      Yes                                 +---------+---------------+---------+-----------+----------+--------------+ SFJ      Full                                                        +---------+---------------+---------+-----------+----------+--------------+ FV Prox  Full                                                        +---------+---------------+---------+-----------+----------+--------------+ FV Mid   Full                                                        +---------+---------------+---------+-----------+----------+--------------+ FV DistalFull                                                        +---------+---------------+---------+-----------+----------+--------------+ PFV      Full                                                        +---------+---------------+---------+-----------+----------+--------------+ POP  Full           Yes      Yes                                  +---------+---------------+---------+-----------+----------+--------------+ PTV      Full                                                        +---------+---------------+---------+-----------+----------+--------------+ PERO     Full                                                        +---------+---------------+---------+-----------+----------+--------------+   +----+---------------+---------+-----------+----------+--------------+ LEFTCompressibilityPhasicitySpontaneityPropertiesThrombus Aging +----+---------------+---------+-----------+----------+--------------+ CFV Full           Yes      Yes                                 +----+---------------+---------+-----------+----------+--------------+     Summary: RIGHT: - There is no evidence of deep vein thrombosis in the lower extremity.  - No cystic structure found in the popliteal fossa.  LEFT: - No evidence of common femoral vein obstruction.  *See table(s) above for measurements and observations. Electronically signed by Curt Jews MD on 10/24/2020 at 5:00:25 PM.    Final         Scheduled Meds: . aspirin EC  81 mg Oral Daily  . enoxaparin (LOVENOX) injection  30 mg Subcutaneous Q24H  . insulin aspart  0-9 Units Subcutaneous TID WC  . losartan  25 mg Oral Daily  . metoprolol tartrate  50 mg Oral BID  . polyethylene glycol  17 g Oral Daily  . senna-docusate  1 tablet Oral BID   Continuous Infusions:    LOS: 3 days    Time spent: 25 mins.More than 50% of that time was spent in counseling and/or coordination of care.      Shelly Coss, MD [facility] P4/20/2022, 11:34 AM

## 2020-10-25 NOTE — Care Management Important Message (Signed)
Important Message  Patient Details  Name: [name] MRN: [id number] Date of Birth: Feb 04, 1946   Medicare Important Message Given:  Yes     Kazimir Hartnett [place]ine Circle 10/25/2020, 3:04 PM

## 2020-10-25 NOTE — Progress Notes (Cosign Needed Addendum)
    Durable Medical Equipment  (From admission, onward)         Start     Ordered   10/25/20 1529  For home use only DME 3 n 1  Once       Comments: drop arm commode   10/25/20 1529   10/25/20 1528  For home use only DME Other see comment  Once       Comments: sliding board (long)  Question:  Length of Need  Answer:  12 Months   10/25/20 1529   10/25/20 1527  For home use only DME Hospital bed  Once       Question Answer Comment  Length of Need Lifetime   Head must be elevated greater than: 30 degrees   Bed type Semi-electric   Hoyer Lift Yes      10/25/20 1527

## 2020-10-25 NOTE — TOC Progression Note (Addendum)
Transition of Care [facility]) - Progression Note    Patient Details  Name: [name] MRN: [id number] Date of Birth: 1946-07-08  Transition of Care [facility]) CM/SW [place], [place] Phone Number: 10/25/2020, 11:43 AM  Clinical Narrative:     CSW spoke with pt daughter over the phone and updated her that [facility] is still reviewing. CSW attempted to get a 2nd choice from a facility that has already extended a bed offer; daughter explains that she is still reviewing facilities and has not chosen one yet. Daughter inquires about pt's length of stay. CSW explained a facility would need to be chosen and that pt would not be held at hospital waiting for [facility] to have a bed since she is medically stable. CSW explained pt has 12+ other facilities to choose from and that a facility would need to be chosen. Pt daughter is waiting for [facility] decision. TOC will continue to follow for d/c plans.    1245: CSW is informed by [facility] that they cannot accept pt due to not having longterm beds available and pt likely needed a longer period of rehab. CSW called and explained to daughter. CSW attempted to get an alternative facility chosen from daughter. Daughter will not choose a facility. She states that she wants to see if pt can transfer well today once PT see's pt and if pt can transfer better, then she will take pt home. CSW notified PT.   1410: CSW is notified by PT that pt and daughter plan for pt to go home with [facility] and DME. CSW confirmed plan with daughter. CSW confirmed with daughter that DME needs include drop arm bedside commode, slide board ( long ), hopsital bed but daughter explained she would like to know the prices before they are arranged. CSW explained he would make referral and that [facility] would discuss prices with her. CSW made referral to [facility] and informed them that daughter has questions about prices. Hospital bed likely won't be delivered until tomorrow. CSW informed daughter that referral  to [facility] was not accept due to staffing. CSW explained he could obtain a home health company to accept referral based on whichever company had availability. Daughter explained that she was particular about [facility] companies stating that there are some that she absolutely will not accept. She gave her next choice of [facility]. CSW attempted to get alternative choices incase [facility] could not accept pt. Daughter would not identify alternative choices stating she needed to review options further. CSW made referral to [facility]; they will review referral. Daughter explains she is not sure whether or not she would need PTAR at this time.       Expected Discharge Plan: [place] Barriers to Discharge: Continued Medical Work up  Expected Discharge Plan and Services Expected Discharge Plan: [place] arrangements for the past 2 months: Single Family Home                                       Social Determinants of Health (SDOH) Interventions    Readmission Risk Interventions No flowsheet data found.

## 2020-10-25 NOTE — TOC Progression Note (Signed)
Transition of Care [facility]) - Progression Note    Patient Details  Name: [name] MRN: [id number] Date of Birth: 05-01-1946  Transition of Care [facility]) CM/SW [place], [place] Phone Number: 10/25/2020, 11:01 AM  Clinical Narrative:     CSW is informed by PT that pt has improved transferring mobility that indicated pt may be more likely to be accepted by [facility]. CSW faxed more recent PT/OT notes. Olivia Mackie from [facility] will review with Software engineer.   Expected Discharge Plan: [place] Barriers to Discharge: Continued Medical Work up  Expected Discharge Plan and Services Expected Discharge Plan: [place] arrangements for the past 2 months: Single Family Home                                       Social Determinants of Health (SDOH) Interventions    Readmission Risk Interventions No flowsheet data found.

## 2020-10-25 NOTE — TOC Progression Note (Signed)
Transition of Care [facility]) - Progression Note    Patient Details  Name: [name] MRN: [id number] Date of Birth: Mar 11, 1946  Transition of Care [facility]) CM/SW [place], [place] Phone Number: 10/25/2020, 4:23 PM  Clinical Narrative:    CSW is informed by [facility] that they cannot accept pt at this time.   CSW called pt daughter to inform her. CSW attempted to get alternative choices for [facility]. Daughter stated she couldn't give a choice right now because she needs to review the companies. CSW explained that he needs to be able to arrange [facility] before planned d/c for tomorrow.Daughter then began to talk about how Home with [facility] seems like not the best option for pt care. CSW explained that pt and pt daughter were the one's opting for [facility] instead of SNF. Daughter explains how there are no options for SNF. CSW explained that pt has 12+ SNF bed offers. Daughter states "no decent one's" CSW reitterates that pt has many Medicare approved facilities that can offer beds and that they can choose one of these facilities but also have a right to refuse these facilities and go home with home health. CSW also explains that he is able to arrange [facility] and can likely secure [facility] but needs pt and daughter to be in agreement with agencies. Daughter explains that she is particular about what [place] she wants. CSW explained that a choice would need to be made for CSW to continue exploring HH options. Daughter then begins to talk about how pt has "lost 3 days" stating that pt was given morphine when she wasn't supposed to. Daughter indicates that she does not  Think pt is medically stable for DC due to pt's blood pressure. CSW reiterates that MD has indicated that pt has been medically stable. Daughter states that she has not received good communication about pt's d/c date. She states "There are people higher up than the doctor and obviously those are the people I need to get in touch with." Daughter indicates that  there is a call she needs to make possibly to a [place]. She states she will follow up with CSW. TOC will continue to follow for ongoing d/c needs.     Expected Discharge Plan: [place] Barriers to Discharge: Continued Medical Work up  Expected Discharge Plan and Services Expected Discharge Plan: [place] arrangements for the past 2 months: Single Family Home                                       Social Determinants of Health (SDOH) Interventions    Readmission Risk Interventions No flowsheet data found.

## 2020-10-26 LAB — GLUCOSE, CAPILLARY
Glucose-Capillary: 127 mg/dL — ABNORMAL HIGH (ref 70–99)
Glucose-Capillary: 151 mg/dL — ABNORMAL HIGH (ref 70–99)
Glucose-Capillary: 169 mg/dL — ABNORMAL HIGH (ref 70–99)

## 2020-10-26 MED ORDER — POLYETHYLENE GLYCOL 3350 17 G PO PACK: 17.0000 g | PACK | Freq: Every day | ORAL | 0 refills | Status: DC | PRN

## 2020-10-26 MED ORDER — HYDROCODONE-ACETAMINOPHEN 5-325 MG PO TABS: 1.0000 | ORAL_TABLET | ORAL | 0 refills | Status: DC | PRN

## 2020-10-26 MED ORDER — METOPROLOL TARTRATE 50 MG PO TABS: 50.0000 mg | ORAL_TABLET | Freq: Two times a day (BID) | ORAL | 0 refills | Status: DC

## 2020-10-26 MED ORDER — SENNOSIDES-DOCUSATE SODIUM 8.6-50 MG PO TABS: 1.0000 | ORAL_TABLET | Freq: Two times a day (BID) | ORAL | 0 refills | Status: AC

## 2020-10-26 MED ORDER — METHOCARBAMOL 500 MG PO TABS: 500.0000 mg | ORAL_TABLET | Freq: Three times a day (TID) | ORAL | 0 refills | Status: DC | PRN

## 2020-10-26 NOTE — Discharge Summary (Signed)
Physician Discharge Summary  [name] QJJ:[id number] DOB: 20-Jan-1946 DOA: 10/19/2020  PCP: Orma Flaming, MD  Admit date: 10/19/2020 Discharge date: 10/26/2020  Admitted From: Home Disposition:  Home  Discharge Condition:Stable CODE STATUS:FULL Diet recommendation: Heart Healthy  Brief/Interim Summary:  Patient is a 75 year old female with history of hypertension, diabetes type 2, CVA with residual left-sided hemiplegia, macular degeneration who presented here with a fall.  She was found to have a right bimalleolar ankle fracture.  Level also showed AKI on CKD stage IIIb.  Orthopedics consulted and she was recommended conservative management, nonweightbearing on the left lower extremity.  PT/OT recommended CIR which has been denied,daughter wanted to take  her home with home health.  Patient is medically stable for discharge to home today.  Following problems were addressed during her hospitalization:  Bimalleolar ankle fracture: Secondary to mechanical fall.  Orthopedics evaluated the patient and recommended conservative management.  Splint was put in the emergency department.  Orthopedics recommended nonweightbearing of the left lower extremity.  Orthopedics recommended to follow with Dr. Doran Durand in 2 weeks. Minimize narcotics.  Continue Robaxin for muscle spasms. There was concern for left lower extremity swelling, venous Doppler negative for DVT.  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  Creatinine was elevated from baseline on presentation,now close to baseline.  Hypertension: Resistant and severe.Has history of chronic hypertension.  On Cozaar at home which was held due to AKI,now restarted   Also started  on metoprolol.  She is allergic to amlodipine.  Daughter says she cannot tolerate hydralazine or clonidine.  Few options.   We recommend close monitoring of blood pressure at home, follow-up with PCP.  Diabetes type 2: Currently diet  controlled.  Hemoglobin A1c of 7.3.    Daughter does not want to initiate any medications.  We recommend close follow-up with PCP.  History of CVA: Has residual left-sided hemiparesis.  On aspirin at home.  Not on a statin at home.  Pressure Injury Stage I -  Intact skin with non-blanchable redness of a localized area usually over a bony prominence. (Active)     Location: Heel  Location Orientation: Right;Left;Lateral;Medial;Bilateral;Posterior  Staging: Stage I -  Intact skin with non-blanchable redness of a localized area usually over a bony prominence.  Wound Description (Comments):   Present on Admission:      Discharge Diagnoses:  Principal Problem:   Bimalleolar ankle fracture, right, closed, initial encounter Active Problems:   Diabetes mellitus without complication ([place])   HTN (hypertension)   Overweight (BMI 25.0-29.9)   Left hemiparesis (HCC)   Stage 4 chronic kidney disease (HCC)   Ankle fracture    Discharge Instructions  Discharge Instructions    Diet - low sodium heart healthy   Complete by: As directed    Discharge instructions   Complete by: As directed    1)Please follow-up with your PCP in a week.  Do a CBC, BMP tests during the follow-up 2)Follow up with orthopedics as an outpatient in 2 weeks. 3)Take prescribed medications as instructed.  Monitor blood pressure at home. 4)Follow up with home health   Increase activity slowly   Complete by: As directed      Allergies as of 10/26/2020      Reactions   Clonidine Anaphylaxis, Swelling, Other (See Comments)   Tongue swelling   Norvasc [amlodipine Besylate] Swelling   Edema and TONGUE swelling   Lisinopril Cough   Morphine And Related    Amlodipine Anxiety,  Nausea Only, Swelling   Codeine Itching, Rash      Medication List    TAKE these medications   aspirin EC 81 MG tablet Take 81 mg by mouth daily.   blood glucose meter kit and supplies Kit Dispense based on patient and insurance  preference. UAD bid for glucose monitoring Dx: E11.9   cholecalciferol 25 MCG (1000 UNIT) tablet Commonly known as: VITAMIN D3 Take 1,000 Units by mouth See admin instructions. Take one tablet by mouth on Monday, Wednesdays and Fridays per patient and daughter   glucose blood test strip UAD TID for glucose monitoring Dx: E11.9   HYDROcodone-acetaminophen 5-325 MG tablet Commonly known as: NORCO/VICODIN Take 1-2 tablets by mouth every 4 (four) hours as needed for moderate pain.   IRON 27 PO Take 1 tablet by mouth See admin instructions. Take one tablet by mouth on Monday, Wednesday and Fridays per patient and daughter   losartan 25 MG tablet Commonly known as: COZAAR TAKE 1 TABLET BY MOUTH TWICE A DAY   methocarbamol 500 MG tablet Commonly known as: ROBAXIN Take 1 tablet (500 mg total) by mouth every 8 (eight) hours as needed for muscle spasms.   metoprolol tartrate 50 MG tablet Commonly known as: LOPRESSOR Take 1 tablet (50 mg total) by mouth 2 (two) times daily.   polyethylene glycol 17 g packet Commonly known as: MIRALAX / GLYCOLAX Take 17 g by mouth daily as needed.   senna-docusate 8.6-50 MG tablet Commonly known as: Senokot-S Take 1 tablet by mouth 2 (two) times daily for 7 days.            Durable Medical Equipment  (From admission, onward)         Start     Ordered   10/25/20 1532  For home use only DME Hospital bed  Once       Question Answer Comment  Length of Need Lifetime   Patient has (list medical condition): R ankle fracture, L hemiplegia due to prior CVA, HTN; DM   Head must be elevated greater than: 30 degrees   Bed type Semi-electric   Hoyer Lift Yes      10/25/20 1533   10/25/20 1529  For home use only DME 3 n 1  Once       Comments: drop arm commode   10/25/20 1529   10/25/20 1528  For home use only DME Other see comment  Once       Comments: sliding board (long)  Question:  Length of Need  Answer:  12 Months   10/25/20 1529           Follow-up Information    Orma Flaming, MD. Schedule an appointment as soon as possible for a visit in 1 week(s).   Specialty: Family Medicine Contact information: [place] [address] [phone]              Allergies  Allergen Reactions  . Clonidine Anaphylaxis, Swelling and Other (See Comments)    Tongue swelling  . Norvasc [Amlodipine Besylate] Swelling    Edema and TONGUE swelling   . Lisinopril Cough  . Morphine And Related   . Amlodipine Anxiety, Nausea Only and Swelling  . Codeine Itching and Rash    Consultations: Orthopedics  Procedures/Studies: DG Ankle Complete Right  Result Date: 10/24/2020 CLINICAL DATA:  Assess right ankle fractures EXAM: RIGHT ANKLE - COMPLETE 3+ VIEW COMPARISON:  10/19/2020 FINDINGS: Redemonstration of mildly displaced medial and lateral malleolar fractures without  appreciable change in alignment compared to the previous study. No periosteal new bone formation at this time. No definite posterior malleolar fracture. Ankle mortise remains aligned without dislocation. No new fractures are identified. Bones are slightly demineralized. Improving soft tissue swelling. Vascular calcifications are present. IMPRESSION: Mildly displaced medial and lateral malleolar fractures without appreciable change in alignment. Electronically Signed   By: Davina Poke D.O.   On: 10/24/2020 16:08   DG Ankle Complete Right  Result Date: 10/19/2020 CLINICAL DATA:  Fall, RIGHT ankle and knee pain EXAM: RIGHT ANKLE - COMPLETE 3+ VIEW COMPARISON:  None FINDINGS: Osseous demineralization. Diffuse soft tissue swelling RIGHT ankle extending into lower leg. Ankle joint space preserved. Mildly displaced fracture of the lateral malleolus. Mildly displaced fracture of the medial malleolus. Moderate-sized plantar calcaneal spur. No additional fracture, dislocation, or bone destruction. IMPRESSION: Mildly displaced bimalleolar fractures RIGHT ankle  with associated soft tissue swelling. Electronically Signed   By: Lavonia Dana M.D.   On: 10/19/2020 10:13   DG Knee Complete 4 Views Right  Result Date: 10/19/2020 CLINICAL DATA:  RIGHT ankle RIGHT knee pain post fall EXAM: RIGHT KNEE - COMPLETE 4+ VIEW COMPARISON:  None FINDINGS: Osseous demineralization. Joint spaces preserved. No acute fracture, dislocation, or bone destruction. No joint effusion. Atherosclerotic calcifications of distal superficial femoral, popliteal, and runoff vessels. IMPRESSION: Osseous demineralization. No acute osseous abnormalities. Electronically Signed   By: Lavonia Dana M.D.   On: 10/19/2020 10:12   DG Hip Unilat With Pelvis 2-3 Views Right  Result Date: 10/19/2020 CLINICAL DATA:  Status post fall.  Right hip pain. EXAM: DG HIP (WITH OR WITHOUT PELVIS) 2-3V RIGHT COMPARISON:  None. FINDINGS: There is no evidence of hip fracture or dislocation. There is no evidence of arthropathy or other focal bone abnormality. Vascular calcifications noted. IMPRESSION: Negative. Electronically Signed   By: Fidela Salisbury M.D.   On: 10/19/2020 11:12   VAS [place] LOWER EXTREMITY VENOUS (DVT)  Result Date: 10/24/2020  Lower Venous DVT Study Indications: Pain.  Risk Factors: None identified. Comparison Study: No prior studies. Performing Technologist: Oliver Hum RVT  Examination Guidelines: A complete evaluation includes B-mode imaging, spectral Doppler, color Doppler, and power Doppler as needed of all accessible portions of each vessel. Bilateral testing is considered an integral part of a complete examination. Limited examinations for reoccurring indications may be performed as noted. The reflux portion of the exam is performed with the patient in reverse Trendelenburg.  +---------+---------------+---------+-----------+----------+--------------+ RIGHT    CompressibilityPhasicitySpontaneityPropertiesThrombus Aging  +---------+---------------+---------+-----------+----------+--------------+ CFV      Full           Yes      Yes                                 +---------+---------------+---------+-----------+----------+--------------+ SFJ      Full                                                        +---------+---------------+---------+-----------+----------+--------------+ FV Prox  Full                                                        +---------+---------------+---------+-----------+----------+--------------+  FV Mid   Full                                                        +---------+---------------+---------+-----------+----------+--------------+ FV DistalFull                                                        +---------+---------------+---------+-----------+----------+--------------+ PFV      Full                                                        +---------+---------------+---------+-----------+----------+--------------+ POP      Full           Yes      Yes                                 +---------+---------------+---------+-----------+----------+--------------+ PTV      Full                                                        +---------+---------------+---------+-----------+----------+--------------+ PERO     Full                                                        +---------+---------------+---------+-----------+----------+--------------+   +----+---------------+---------+-----------+----------+--------------+ LEFTCompressibilityPhasicitySpontaneityPropertiesThrombus Aging +----+---------------+---------+-----------+----------+--------------+ CFV Full           Yes      Yes                                 +----+---------------+---------+-----------+----------+--------------+     Summary: RIGHT: - There is no evidence of deep vein thrombosis in the lower extremity.  - No cystic structure found in the popliteal fossa.   LEFT: - No evidence of common femoral vein obstruction.  *See table(s) above for measurements and observations. Electronically signed by Curt Jews MD on 10/24/2020 at 5:00:25 PM.    Final        Subjective: There are definitely patient seen and examined the bedside this morning.  Hemodynamically stable.  Comfortable.  Denies any complaints.  Medically stable for discharge to home today.  Discharge Exam: Vitals:   10/25/20 2056 10/26/20 0317  BP: (!) 197/75 (!) 165/73  Pulse: 64 64  Resp: 19 19  Temp: 98.7 F (37.1 C) 98.6 F (37 C)  SpO2: 95% 93%   Vitals:   10/25/20 0613 10/25/20 0840 10/25/20 2056 10/26/20 0317  BP: (!) 190/76 (!) 186/69 (!) 197/75 (!) 165/73  Pulse: 69 68 64 64  Resp: $Remo'18 18 19 19  'zmURU$ Temp: 98.3 F (36.8 C)  97.8 F (36.6 C) 98.7 F (37.1 C) 98.6 F (37 C)  TempSrc: Oral Oral Oral Oral  SpO2: 100% 96% 95% 93%  Weight:      Height:        General: Pt is alert, awake, not in acute distress Cardiovascular: RRR, S1/S2 +, no rubs, no gallops Respiratory: CTA bilaterally, no wheezing, no rhonchi Abdominal: Soft, NT, ND, bowel sounds + Extremities: no edema, no cyanosis,dressing on the right lower extremity    The results of significant diagnostics from this hospitalization (including imaging, microbiology, ancillary and laboratory) are listed below for reference.     Microbiology: Recent Results (from the past 240 hour(s))  SARS CORONAVIRUS 2 (TAT 6-24 HRS) Nasopharyngeal Nasopharyngeal Swab     Status: None   Collection Time: 10/19/20  1:06 PM   Specimen: Nasopharyngeal Swab  Result Value Ref Range Status   SARS Coronavirus 2 NEGATIVE NEGATIVE Final    Comment: (NOTE) SARS-CoV-2 target nucleic acids are NOT DETECTED.  The SARS-CoV-2 RNA is generally detectable in upper and lower respiratory specimens during the acute phase of infection. Negative results do not preclude SARS-CoV-2 infection, do not rule out co-infections with other pathogens, and  should not be used as the sole basis for treatment or other patient management decisions. Negative results must be combined with clinical observations, patient history, and epidemiological information. The expected result is Negative.  Fact Sheet for Patients: HairSlick.no  Fact Sheet for Healthcare Providers: quierodirigir.com  This test is not yet approved or cleared by the [place] FDA and  has been authorized for detection and/or diagnosis of SARS-CoV-2 by FDA under an Emergency Use Authorization (EUA). This EUA will remain  in effect (meaning this test can be used) for the duration of the COVID-19 declaration under Se ction 564(b)(1) of the Act, 21 U.S.C. section 360bbb-3(b)(1), unless the authorization is terminated or revoked sooner.  Performed at [facility], [address], [place], [place] [address]   Resp Panel by RT-PCR (Flu A&B, Covid) Nasopharyngeal Swab     Status: None   Collection Time: 10/24/20 12:28 AM   Specimen: Nasopharyngeal Swab; Nasopharyngeal(NP) swabs in vial transport medium  Result Value Ref Range Status   SARS Coronavirus 2 by RT PCR NEGATIVE NEGATIVE Final    Comment: (NOTE) SARS-CoV-2 target nucleic acids are NOT DETECTED.  The SARS-CoV-2 RNA is generally detectable in upper respiratory specimens during the acute phase of infection. The lowest concentration of SARS-CoV-2 viral copies this assay can detect is 138 copies/mL. A negative result does not preclude SARS-Cov-2 infection and should not be used as the sole basis for treatment or other patient management decisions. A negative result may occur with  improper specimen collection/handling, submission of specimen other than nasopharyngeal swab, presence of viral mutation(s) within the areas targeted by this assay, and inadequate number of viral copies(<138 copies/mL). A negative result must be combined with clinical  observations, patient history, and epidemiological information. The expected result is Negative.  Fact Sheet for Patients:  BloggerCourse.com  Fact Sheet for Healthcare Providers:  SeriousBroker.it  This test is no t yet approved or cleared by the [place] FDA and  has been authorized for detection and/or diagnosis of SARS-CoV-2 by FDA under an Emergency Use Authorization (EUA). This EUA will remain  in effect (meaning this test can be used) for the duration of the COVID-19 declaration under Section 564(b)(1) of the Act, 21 U.S.C.section 360bbb-3(b)(1), unless the authorization is terminated  or revoked sooner.  Influenza A by PCR NEGATIVE NEGATIVE Final   Influenza B by PCR NEGATIVE NEGATIVE Final    Comment: (NOTE) The Xpert Xpress SARS-CoV-2/FLU/RSV plus assay is intended as an aid in the diagnosis of influenza from Nasopharyngeal swab specimens and should not be used as a sole basis for treatment. Nasal washings and aspirates are unacceptable for Xpert Xpress SARS-CoV-2/FLU/RSV testing.  Fact Sheet for Patients: EntrepreneurPulse.com.au  Fact Sheet for Healthcare Providers: IncredibleEmployment.be  This test is not yet approved or cleared by the [place] FDA and has been authorized for detection and/or diagnosis of SARS-CoV-2 by FDA under an Emergency Use Authorization (EUA). This EUA will remain in effect (meaning this test can be used) for the duration of the COVID-19 declaration under Section 564(b)(1) of the Act, 21 U.S.C. section 360bbb-3(b)(1), unless the authorization is terminated or revoked.  Performed at [facility], [place] [address] [place], [place] [address]      Labs: BNP (last 3 results) No results for input(s): BNP in the last 8760 hours. Basic Metabolic Panel: Recent Labs  Lab 10/19/20 1036 10/20/20 0137 10/21/20 0127 10/22/20 0138  10/24/20 0526  NA 137 135 139 136 139  K 4.2 3.9 3.9 3.7 3.9  CL 112* 110 110 109 109  CO2 15* 21* 20* 20* 22  GLUCOSE 160* 146* 115* 133* 123*  BUN 36* 35* 33* 28* 28*  CREATININE 2.01* 2.23* 2.19* 1.86* 1.90*  CALCIUM 8.6* 8.6* 8.7* 8.8* 8.8*  MG  --   --  1.9 1.8  --    Liver Function Tests: No results for input(s): AST, ALT, ALKPHOS, BILITOT, PROT, ALBUMIN in the last 168 hours. No results for input(s): LIPASE, AMYLASE in the last 168 hours. No results for input(s): AMMONIA in the last 168 hours. CBC: Recent Labs  Lab 10/19/20 1036 10/20/20 0137 10/21/20 0127 10/22/20 0138  WBC 9.4 9.3 8.6 10.2  NEUTROABS  --   --  5.5  --   HGB 13.1 11.0* 10.6* 11.2*  HCT 40.2 34.0* 32.1* 33.5*  MCV 88.9 87.2 87.0 84.4  PLT 174 266 241 269   Cardiac Enzymes: No results for input(s): CKTOTAL, CKMB, CKMBINDEX, TROPONINI in the last 168 hours. BNP: Invalid input(s): POCBNP CBG: Recent Labs  Lab 10/25/20 0651 10/25/20 1220 10/25/20 1658 10/25/20 2022 10/26/20 0637  GLUCAP 145* 128* 135* 140* 127*   D-Dimer No results for input(s): DDIMER in the last 72 hours. Hgb A1c No results for input(s): HGBA1C in the last 72 hours. Lipid Profile No results for input(s): CHOL, HDL, LDLCALC, TRIG, CHOLHDL, LDLDIRECT in the last 72 hours. Thyroid function studies No results for input(s): TSH, T4TOTAL, T3FREE, THYROIDAB in the last 72 hours.  Invalid input(s): FREET3 Anemia work up No results for input(s): VITAMINB12, FOLATE, FERRITIN, TIBC, IRON, RETICCTPCT in the last 72 hours. Urinalysis    Component Value Date/Time   COLORURINE YELLOW 10/20/2020 1231   APPEARANCEUR CLEAR 10/20/2020 1231   LABSPEC 1.011 10/20/2020 1231   PHURINE 5.0 10/20/2020 1231   GLUCOSEU 50 (A) 10/20/2020 1231   GLUCOSEU NEGATIVE 12/21/2019 0946   HGBUR SMALL (A) 10/20/2020 1231   BILIRUBINUR NEGATIVE 10/20/2020 1231   KETONESUR NEGATIVE 10/20/2020 1231   PROTEINUR 100 (A) 10/20/2020 1231   UROBILINOGEN  0.2 12/21/2019 0946   NITRITE NEGATIVE 10/20/2020 1231   LEUKOCYTESUR NEGATIVE 10/20/2020 1231   Sepsis Labs Invalid input(s): PROCALCITONIN,  WBC,  LACTICIDVEN Microbiology Recent Results (from the past 240 hour(s))  SARS CORONAVIRUS 2 (TAT 6-24 HRS) Nasopharyngeal  Nasopharyngeal Swab     Status: None   Collection Time: 10/19/20  1:06 PM   Specimen: Nasopharyngeal Swab  Result Value Ref Range Status   SARS Coronavirus 2 NEGATIVE NEGATIVE Final    Comment: (NOTE) SARS-CoV-2 target nucleic acids are NOT DETECTED.  The SARS-CoV-2 RNA is generally detectable in upper and lower respiratory specimens during the acute phase of infection. Negative results do not preclude SARS-CoV-2 infection, do not rule out co-infections with other pathogens, and should not be used as the sole basis for treatment or other patient management decisions. Negative results must be combined with clinical observations, patient history, and epidemiological information. The expected result is Negative.  Fact Sheet for Patients: SugarRoll.be  Fact Sheet for Healthcare Providers: https://www.woods-mathews.com/  This test is not yet approved or cleared by the [place] FDA and  has been authorized for detection and/or diagnosis of SARS-CoV-2 by FDA under an Emergency Use Authorization (EUA). This EUA will remain  in effect (meaning this test can be used) for the duration of the COVID-19 declaration under Se ction 564(b)(1) of the Act, 21 U.S.C. section 360bbb-3(b)(1), unless the authorization is terminated or revoked sooner.  Performed at [facility], [place] [address], [place], [place] [address]   Resp Panel by RT-PCR (Flu A&B, Covid) Nasopharyngeal Swab     Status: None   Collection Time: 10/24/20 12:28 AM   Specimen: Nasopharyngeal Swab; Nasopharyngeal(NP) swabs in vial transport medium  Result Value Ref Range Status   SARS Coronavirus 2 by RT PCR  NEGATIVE NEGATIVE Final    Comment: (NOTE) SARS-CoV-2 target nucleic acids are NOT DETECTED.  The SARS-CoV-2 RNA is generally detectable in upper respiratory specimens during the acute phase of infection. The lowest concentration of SARS-CoV-2 viral copies this assay can detect is 138 copies/mL. A negative result does not preclude SARS-Cov-2 infection and should not be used as the sole basis for treatment or other patient management decisions. A negative result may occur with  improper specimen collection/handling, submission of specimen other than nasopharyngeal swab, presence of viral mutation(s) within the areas targeted by this assay, and inadequate number of viral copies(<138 copies/mL). A negative result must be combined with clinical observations, patient history, and epidemiological information. The expected result is Negative.  Fact Sheet for Patients:  EntrepreneurPulse.com.au  Fact Sheet for Healthcare Providers:  IncredibleEmployment.be  This test is no t yet approved or cleared by the [place] FDA and  has been authorized for detection and/or diagnosis of SARS-CoV-2 by FDA under an Emergency Use Authorization (EUA). This EUA will remain  in effect (meaning this test can be used) for the duration of the COVID-19 declaration under Section 564(b)(1) of the Act, 21 U.S.C.section 360bbb-3(b)(1), unless the authorization is terminated  or revoked sooner.       Influenza A by PCR NEGATIVE NEGATIVE Final   Influenza B by PCR NEGATIVE NEGATIVE Final    Comment: (NOTE) The Xpert Xpress SARS-CoV-2/FLU/RSV plus assay is intended as an aid in the diagnosis of influenza from Nasopharyngeal swab specimens and should not be used as a sole basis for treatment. Nasal washings and aspirates are unacceptable for Xpert Xpress SARS-CoV-2/FLU/RSV testing.  Fact Sheet for Patients: EntrepreneurPulse.com.au  Fact Sheet for  Healthcare Providers: IncredibleEmployment.be  This test is not yet approved or cleared by the [place] FDA and has been authorized for detection and/or diagnosis of SARS-CoV-2 by FDA under an Emergency Use Authorization (EUA). This EUA will remain in effect (meaning this test can  be used) for the duration of the COVID-19 declaration under Section 564(b)(1) of the Act, 21 U.S.C. section 360bbb-3(b)(1), unless the authorization is terminated or revoked.  Performed at [facility], [place] [address] [place], [place] [address]     Please note: You were cared for by a hospitalist during your hospital stay. Once you are discharged, your primary care physician will handle any further medical issues. Please note that NO REFILLS for any discharge medications will be authorized once you are discharged, as it is imperative that you return to your primary care physician (or establish a relationship with a primary care physician if you do not have one) for your post hospital discharge needs so that they can reassess your need for medications and monitor your lab values.    Time coordinating discharge: 40 minutes  SIGNED:   Shelly Coss, MD  [facility] 10/26/2020, 10:11 AM Pager [phone]  If 7PM-7AM, please contact night-coverage [email or web address] Password [email or web address]

## 2020-10-26 NOTE — Progress Notes (Signed)
   10/26/20 1949  AVS Discharge Documentation  AVS Discharge Instructions Including Medications Placed in discharge packet for receiving facility;Provided to patient/caregiver  Name of Person Receiving AVS Discharge Instructions Including Medications Reviewed with Bethanne Ginger and Hester Mates and given to [place]  Name of Clinician That Reviewed AVS Discharge Instructions Including Medications Gregary Signs, RN    Writer reviewed discharge paperwork and instructions with patient and daughter Hester Mates and both verbalized an understanding of teaching. Patient IV removed prior to discharge, tolerated well. Patient discharged to home via PTAR with all personal belongings and d/c packet, no issues.

## 2020-10-26 NOTE — Progress Notes (Addendum)
Physical Therapy Treatment Patient Details Name: [name] MRN: [id number] DOB: July 30, 1945 Today's Date: 10/26/2020    History of Present Illness 75 y.o. female presents to [facility] ED on 10/19/2020 after a fall. Pt found to have R bimalleolar ankle fracture. 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 PMH includes HTN; DM; CVA with resultant L hemiplegia; and macular degeneration.    PT Comments    Pt supine in bed on arrival.  Focused on transfer training and LE HEP this session.  Pt issued HEP for home use.  Plan to d/c home with PTAR today.     Follow Up Recommendations  SNF;Supervision/Assistance - 24 hour (family refusing placement so will need HHPT.)     Equipment Recommendations  Hospital bed;Other (comment);3in1 (PT) (hoyer lift/hoyer lift pad/slide board/ EMS transport home.)    Recommendations for Other Services       Precautions / Restrictions Precautions Precautions: Fall;Other (comment) Precaution Comments: R ankle fracture Required Braces or Orthoses: Splint/Cast Splint/Cast: R ankle Restrictions Weight Bearing Restrictions: Yes RLE Weight Bearing: Non weight bearing Other Position/Activity Restrictions: L hemiplegia at baseline.    Mobility  Bed Mobility Overal bed mobility: Needs Assistance Bed Mobility: Rolling Rolling: Mod assist Sidelying to sit: Mod assist       General bed mobility comments: Performed rolling to move to edge of bed and assisted with LE advancement and trunk elevation.  Pt utilized PTA as railing and PTA provided support behind patient's scapula to move to edge of bed.    Transfers Overall transfer level: Needs assistance Equipment used: None Transfers: Sit to/from Stand     Squat pivot transfers: Mod assist;Total assist (Mod to L side.  Total to R side.)     General transfer comment: Pt required decreased assistance moving to her L side with L knee blocked.  Increased assistance to move to her R side.  Performed multiple  attempts and daughter participate x 1 to L side.  Pt required frequent cueing to weight shift forward.  Ambulation/Gait Ambulation/Gait assistance:  (unable.)               Stairs             Wheelchair Mobility    Modified Rankin (Stroke Patients Only)       Balance     Sitting balance-Leahy Scale: Fair Sitting balance - Comments: Initally poor with max cues to weight shift forward. Pt required assistance to maintain forward position of trunk to reduce LOB posterior. Postural control: Posterior lean   Standing balance-Leahy Scale: Zero Standing balance comment: Pt unable to come to a full stand with Max A                            Cognition Arousal/Alertness: Awake/alert Behavior During Therapy: WFL for tasks assessed/performed Overall Cognitive Status: Within Functional Limits for tasks assessed                                        Exercises General Exercises - Lower Extremity Quad Sets: AROM;Both;10 reps;Supine Gluteal Sets: AROM;Both;10 reps;Supine Long Arc Quad: AROM;Both;10 reps;Seated Heel Slides: AROM;AAROM;Both;10 reps;Supine Hip ABduction/ADduction: AROM;Both;10 reps;Seated (pillow squeeze.) Straight Leg Raises: AROM;AAROM;Both;10 reps;Supine Hip Flexion/Marching: AROM;Both;10 reps;Seated    General Comments        Pertinent Vitals/Pain Pain Assessment: Faces Pain Score: 6  Pain Location:  R foot/ankle and R calf w/ spasm Pain Descriptors / Indicators: Discomfort;Grimacing;Guarding;Spasm;Sharp;Restless Pain Intervention(s): Monitored during session;Repositioned    Home Living                      Prior Function            PT Goals (current goals can now be found in the care plan section) Acute Rehab PT Goals Patient Stated Goal: To feel back to normal Potential to Achieve Goals: Fair Progress towards PT goals: Progressing toward goals    Frequency    Min 3X/week      PT Plan Current  plan remains appropriate    Co-evaluation              AM-PAC PT "6 Clicks" Mobility   Outcome Measure  Help needed turning from your back to your side while in a flat bed without using bedrails?: A Lot Help needed moving from lying on your back to sitting on the side of a flat bed without using bedrails?: A Lot Help needed moving to and from a bed to a chair (including a wheelchair)?: A Lot Help needed standing up from a chair using your arms (e.g., wheelchair or bedside chair)?: Total Help needed to walk in hospital room?: Total Help needed climbing 3-5 steps with a railing? : Total 6 Click Score: 9    End of Session Equipment Utilized During Treatment: Gait belt Activity Tolerance: Patient tolerated treatment well Patient left: with call bell/phone within reach;with family/visitor present;in chair;with chair alarm set Nurse Communication: Mobility status;Need for lift equipment PT Visit Diagnosis: Other abnormalities of gait and mobility (R26.89);Muscle weakness (generalized) (M62.81);Hemiplegia and hemiparesis;Pain Hemiplegia - Right/Left: Left Hemiplegia - dominant/non-dominant: Non-dominant Hemiplegia - caused by: Cerebral infarction Pain - Right/Left: Right Pain - part of body: Ankle and joints of foot     Time: [id number] PT Time Calculation (min) (ACUTE ONLY): 36 min  Charges:  $Therapeutic Exercise: 8-22 mins $Therapeutic Activity: 8-22 mins                     Erasmo Leventhal , PTA Acute Rehabilitation Services Pager [phone] Office [phone]     [name] Eli Hose 10/26/2020, 4:58 PM

## 2020-10-26 NOTE — TOC Progression Note (Addendum)
Transition of Care [facility]) - Progression Note    Patient Details  Name: [name] MRN: [id number] Date of Birth: 05/12/1946  Transition of Care [facility]) CM/SW [place], [place] Phone Number: 10/26/2020, 1:45 PM  Clinical Narrative:     CSW spoke with pt and pt daughter bedside. They are at this time agreeable to home with [facility]. Daughter expresses she is agreeable to [facility] with availability since her preferences do not have availability. DME should be delivered today. Pt would need PTAR transport.   1336: At this time CSW has contacted the following [facility] agencies: [facility] is unable to accept referral [facility]  is unable to accept referral [facility]  is unable to accept referral [facility]  is unable to accept referral [facility] does not accept pt's specific group plan.  [facility]  is unable to accept referral [facility] is unable to accept referral [facility] is unable to accept referral [facility]  is unable to accept referral CSW is waiting on responses from [facility] and [facility].   1413: [facility] is able to accept pt for [facility]. CSW notified pt's daughter. Daughter stated that they just want PT/OT. No RN or aide needed. CSW updated [facility]. [facility] added to AVS. PTAR called for next available transport.    Expected Discharge Plan: [place] Barriers to Discharge: Continued Medical Work up  Expected Discharge Plan and Services Expected Discharge Plan: [place] arrangements for the past 2 months: Single Family Home Expected Discharge Date: 10/26/20                                     Social Determinants of Health (SDOH) Interventions    Readmission Risk Interventions No flowsheet data found.

## 2020-10-26 NOTE — Plan of Care (Signed)
  Problem: Education: Goal: Ability to describe self-care measures that may prevent or decrease complications (Diabetes Survival Skills Education) will improve Outcome: Adequate for Discharge Goal: Individualized Educational Video(s) Outcome: Adequate for Discharge   Problem: Coping: Goal: Ability to adjust to condition or change in health will improve Outcome: Adequate for Discharge   Problem: Fluid Volume: Goal: Ability to maintain a balanced intake and output will improve Outcome: Adequate for Discharge   Problem: Health Behavior/Discharge Planning: Goal: Ability to identify and utilize available resources and services will improve Outcome: Adequate for Discharge Goal: Ability to manage health-related needs will improve Outcome: Adequate for Discharge   Problem: Metabolic: Goal: Ability to maintain appropriate glucose levels will improve Outcome: Adequate for Discharge   Problem: Nutritional: Goal: Maintenance of adequate nutrition will improve Outcome: Adequate for Discharge Goal: Progress toward achieving an optimal weight will improve Outcome: Adequate for Discharge   Problem: Skin Integrity: Goal: Risk for impaired skin integrity will decrease Outcome: Adequate for Discharge   Problem: Tissue Perfusion: Goal: Adequacy of tissue perfusion will improve Outcome: Adequate for Discharge   Problem: Education: Goal: Knowledge of the prescribed therapeutic regimen will improve Outcome: Adequate for Discharge   Problem: Activity: Goal: Ability to increase mobility will improve Outcome: Adequate for Discharge   Problem: Physical Regulation: Goal: Postoperative complications will be avoided or minimized Outcome: Adequate for Discharge   Problem: Pain Management: Goal: Pain level will decrease with appropriate interventions Outcome: Adequate for Discharge   Problem: Skin Integrity: Goal: Will show signs of wound healing Outcome: Adequate for Discharge   Problem:  Education: Goal: Knowledge of General Education information will improve Description: Including pain rating scale, medication(s)/side effects and non-pharmacologic comfort measures Outcome: Adequate for Discharge   Problem: Health Behavior/Discharge Planning: Goal: Ability to manage health-related needs will improve Outcome: Adequate for Discharge   Problem: Clinical Measurements: Goal: Ability to maintain clinical measurements within normal limits will improve Outcome: Adequate for Discharge Goal: Will remain free from infection Outcome: Adequate for Discharge Goal: Diagnostic test results will improve Outcome: Adequate for Discharge Goal: Respiratory complications will improve Outcome: Adequate for Discharge Goal: Cardiovascular complication will be avoided Outcome: Adequate for Discharge   Problem: Activity: Goal: Risk for activity intolerance will decrease Outcome: Adequate for Discharge   Problem: Nutrition: Goal: Adequate nutrition will be maintained Outcome: Adequate for Discharge   Problem: Coping: Goal: Level of anxiety will decrease Outcome: Adequate for Discharge   Problem: Elimination: Goal: Will not experience complications related to bowel motility Outcome: Adequate for Discharge Goal: Will not experience complications related to urinary retention Outcome: Adequate for Discharge   Problem: Pain Managment: Goal: General experience of comfort will improve Outcome: Adequate for Discharge   Problem: Safety: Goal: Ability to remain free from injury will improve Outcome: Adequate for Discharge   Problem: Skin Integrity: Goal: Risk for impaired skin integrity will decrease Outcome: Adequate for Discharge

## 2020-10-31 DIAGNOSIS — I69354 Hemiplegia and hemiparesis following cerebral infarction affecting left non-dominant side: Secondary | ICD-10-CM | POA: Diagnosis not present

## 2020-10-31 DIAGNOSIS — Z79891 Long term (current) use of opiate analgesic: Secondary | ICD-10-CM | POA: Diagnosis not present

## 2020-10-31 DIAGNOSIS — E1122 Type 2 diabetes mellitus with diabetic chronic kidney disease: Secondary | ICD-10-CM | POA: Diagnosis not present

## 2020-10-31 DIAGNOSIS — I129 Hypertensive chronic kidney disease with stage 1 through stage 4 chronic kidney disease, or unspecified chronic kidney disease: Secondary | ICD-10-CM | POA: Diagnosis not present

## 2020-10-31 DIAGNOSIS — H3530 Unspecified macular degeneration: Secondary | ICD-10-CM | POA: Diagnosis not present

## 2020-10-31 DIAGNOSIS — N184 Chronic kidney disease, stage 4 (severe): Secondary | ICD-10-CM | POA: Diagnosis not present

## 2020-10-31 DIAGNOSIS — S82841D Displaced bimalleolar fracture of right lower leg, subsequent encounter for closed fracture with routine healing: Secondary | ICD-10-CM | POA: Diagnosis not present

## 2020-10-31 DIAGNOSIS — Z9181 History of falling: Secondary | ICD-10-CM | POA: Diagnosis not present

## 2020-11-01 ENCOUNTER — Telehealth: Payer: Self-pay

## 2020-11-01 DIAGNOSIS — S82841A Displaced bimalleolar fracture of right lower leg, initial encounter for closed fracture: Secondary | ICD-10-CM | POA: Diagnosis not present

## 2020-11-01 NOTE — Telephone Encounter (Signed)
[name] from [facility] is calling in stating that patient is refusing nursing as [facility] will continue with PT.

## 2020-11-10 DIAGNOSIS — Z9181 History of falling: Secondary | ICD-10-CM | POA: Diagnosis not present

## 2020-11-10 DIAGNOSIS — N184 Chronic kidney disease, stage 4 (severe): Secondary | ICD-10-CM | POA: Diagnosis not present

## 2020-11-10 DIAGNOSIS — I69354 Hemiplegia and hemiparesis following cerebral infarction affecting left non-dominant side: Secondary | ICD-10-CM | POA: Diagnosis not present

## 2020-11-10 DIAGNOSIS — H3530 Unspecified macular degeneration: Secondary | ICD-10-CM | POA: Diagnosis not present

## 2020-11-10 DIAGNOSIS — I129 Hypertensive chronic kidney disease with stage 1 through stage 4 chronic kidney disease, or unspecified chronic kidney disease: Secondary | ICD-10-CM | POA: Diagnosis not present

## 2020-11-10 DIAGNOSIS — Z79891 Long term (current) use of opiate analgesic: Secondary | ICD-10-CM | POA: Diagnosis not present

## 2020-11-10 DIAGNOSIS — E1122 Type 2 diabetes mellitus with diabetic chronic kidney disease: Secondary | ICD-10-CM | POA: Diagnosis not present

## 2020-11-10 DIAGNOSIS — S82841D Displaced bimalleolar fracture of right lower leg, subsequent encounter for closed fracture with routine healing: Secondary | ICD-10-CM | POA: Diagnosis not present

## 2020-11-13 DIAGNOSIS — Z9889 Other specified postprocedural states: Secondary | ICD-10-CM | POA: Diagnosis not present

## 2020-11-13 DIAGNOSIS — S82841A Displaced bimalleolar fracture of right lower leg, initial encounter for closed fracture: Secondary | ICD-10-CM | POA: Diagnosis not present

## 2020-11-15 DIAGNOSIS — Z79891 Long term (current) use of opiate analgesic: Secondary | ICD-10-CM | POA: Diagnosis not present

## 2020-11-15 DIAGNOSIS — Z9181 History of falling: Secondary | ICD-10-CM | POA: Diagnosis not present

## 2020-11-15 DIAGNOSIS — S82841D Displaced bimalleolar fracture of right lower leg, subsequent encounter for closed fracture with routine healing: Secondary | ICD-10-CM | POA: Diagnosis not present

## 2020-11-15 DIAGNOSIS — E1122 Type 2 diabetes mellitus with diabetic chronic kidney disease: Secondary | ICD-10-CM | POA: Diagnosis not present

## 2020-11-15 DIAGNOSIS — I129 Hypertensive chronic kidney disease with stage 1 through stage 4 chronic kidney disease, or unspecified chronic kidney disease: Secondary | ICD-10-CM | POA: Diagnosis not present

## 2020-11-15 DIAGNOSIS — N184 Chronic kidney disease, stage 4 (severe): Secondary | ICD-10-CM | POA: Diagnosis not present

## 2020-11-15 DIAGNOSIS — H3530 Unspecified macular degeneration: Secondary | ICD-10-CM | POA: Diagnosis not present

## 2020-11-15 DIAGNOSIS — I69354 Hemiplegia and hemiparesis following cerebral infarction affecting left non-dominant side: Secondary | ICD-10-CM | POA: Diagnosis not present

## 2020-11-22 DIAGNOSIS — S82841D Displaced bimalleolar fracture of right lower leg, subsequent encounter for closed fracture with routine healing: Secondary | ICD-10-CM | POA: Diagnosis not present

## 2020-11-22 DIAGNOSIS — Z79891 Long term (current) use of opiate analgesic: Secondary | ICD-10-CM | POA: Diagnosis not present

## 2020-11-22 DIAGNOSIS — N184 Chronic kidney disease, stage 4 (severe): Secondary | ICD-10-CM | POA: Diagnosis not present

## 2020-11-22 DIAGNOSIS — Z9181 History of falling: Secondary | ICD-10-CM | POA: Diagnosis not present

## 2020-11-22 DIAGNOSIS — H3530 Unspecified macular degeneration: Secondary | ICD-10-CM | POA: Diagnosis not present

## 2020-11-22 DIAGNOSIS — E1122 Type 2 diabetes mellitus with diabetic chronic kidney disease: Secondary | ICD-10-CM | POA: Diagnosis not present

## 2020-11-22 DIAGNOSIS — I69354 Hemiplegia and hemiparesis following cerebral infarction affecting left non-dominant side: Secondary | ICD-10-CM | POA: Diagnosis not present

## 2020-11-22 DIAGNOSIS — I129 Hypertensive chronic kidney disease with stage 1 through stage 4 chronic kidney disease, or unspecified chronic kidney disease: Secondary | ICD-10-CM | POA: Diagnosis not present

## 2020-11-25 DIAGNOSIS — I69952 Hemiplegia and hemiparesis following unspecified cerebrovascular disease affecting left dominant side: Secondary | ICD-10-CM | POA: Diagnosis not present

## 2020-11-25 DIAGNOSIS — I639 Cerebral infarction, unspecified: Secondary | ICD-10-CM | POA: Diagnosis not present

## 2020-11-25 DIAGNOSIS — S82841A Displaced bimalleolar fracture of right lower leg, initial encounter for closed fracture: Secondary | ICD-10-CM | POA: Diagnosis not present

## 2020-11-25 DIAGNOSIS — S82899A Other fracture of unspecified lower leg, initial encounter for closed fracture: Secondary | ICD-10-CM | POA: Diagnosis not present

## 2020-11-25 DIAGNOSIS — N184 Chronic kidney disease, stage 4 (severe): Secondary | ICD-10-CM | POA: Diagnosis not present

## 2020-11-27 DIAGNOSIS — S82841A Displaced bimalleolar fracture of right lower leg, initial encounter for closed fracture: Secondary | ICD-10-CM | POA: Diagnosis not present

## 2020-11-27 DIAGNOSIS — Z9889 Other specified postprocedural states: Secondary | ICD-10-CM | POA: Diagnosis not present

## 2020-11-28 DIAGNOSIS — Z79891 Long term (current) use of opiate analgesic: Secondary | ICD-10-CM | POA: Diagnosis not present

## 2020-11-28 DIAGNOSIS — N184 Chronic kidney disease, stage 4 (severe): Secondary | ICD-10-CM | POA: Diagnosis not present

## 2020-11-28 DIAGNOSIS — I69354 Hemiplegia and hemiparesis following cerebral infarction affecting left non-dominant side: Secondary | ICD-10-CM | POA: Diagnosis not present

## 2020-11-28 DIAGNOSIS — E1122 Type 2 diabetes mellitus with diabetic chronic kidney disease: Secondary | ICD-10-CM | POA: Diagnosis not present

## 2020-11-28 DIAGNOSIS — S82841D Displaced bimalleolar fracture of right lower leg, subsequent encounter for closed fracture with routine healing: Secondary | ICD-10-CM | POA: Diagnosis not present

## 2020-11-28 DIAGNOSIS — Z9181 History of falling: Secondary | ICD-10-CM | POA: Diagnosis not present

## 2020-11-28 DIAGNOSIS — H3530 Unspecified macular degeneration: Secondary | ICD-10-CM | POA: Diagnosis not present

## 2020-11-28 DIAGNOSIS — I129 Hypertensive chronic kidney disease with stage 1 through stage 4 chronic kidney disease, or unspecified chronic kidney disease: Secondary | ICD-10-CM | POA: Diagnosis not present

## 2020-11-29 ENCOUNTER — Ambulatory Visit: Payer: Self-pay | Admitting: Family Medicine

## 2020-11-29 DIAGNOSIS — I69354 Hemiplegia and hemiparesis following cerebral infarction affecting left non-dominant side: Secondary | ICD-10-CM | POA: Diagnosis not present

## 2020-11-29 DIAGNOSIS — Z79891 Long term (current) use of opiate analgesic: Secondary | ICD-10-CM | POA: Diagnosis not present

## 2020-11-29 DIAGNOSIS — H3530 Unspecified macular degeneration: Secondary | ICD-10-CM | POA: Diagnosis not present

## 2020-11-29 DIAGNOSIS — Z9181 History of falling: Secondary | ICD-10-CM | POA: Diagnosis not present

## 2020-11-29 DIAGNOSIS — S82841D Displaced bimalleolar fracture of right lower leg, subsequent encounter for closed fracture with routine healing: Secondary | ICD-10-CM | POA: Diagnosis not present

## 2020-11-29 DIAGNOSIS — N184 Chronic kidney disease, stage 4 (severe): Secondary | ICD-10-CM | POA: Diagnosis not present

## 2020-11-29 DIAGNOSIS — E1122 Type 2 diabetes mellitus with diabetic chronic kidney disease: Secondary | ICD-10-CM | POA: Diagnosis not present

## 2020-11-29 DIAGNOSIS — I129 Hypertensive chronic kidney disease with stage 1 through stage 4 chronic kidney disease, or unspecified chronic kidney disease: Secondary | ICD-10-CM | POA: Diagnosis not present

## 2020-12-05 DIAGNOSIS — Z79891 Long term (current) use of opiate analgesic: Secondary | ICD-10-CM | POA: Diagnosis not present

## 2020-12-05 DIAGNOSIS — S82841D Displaced bimalleolar fracture of right lower leg, subsequent encounter for closed fracture with routine healing: Secondary | ICD-10-CM | POA: Diagnosis not present

## 2020-12-05 DIAGNOSIS — N184 Chronic kidney disease, stage 4 (severe): Secondary | ICD-10-CM | POA: Diagnosis not present

## 2020-12-05 DIAGNOSIS — Z9181 History of falling: Secondary | ICD-10-CM | POA: Diagnosis not present

## 2020-12-05 DIAGNOSIS — E1122 Type 2 diabetes mellitus with diabetic chronic kidney disease: Secondary | ICD-10-CM | POA: Diagnosis not present

## 2020-12-05 DIAGNOSIS — I129 Hypertensive chronic kidney disease with stage 1 through stage 4 chronic kidney disease, or unspecified chronic kidney disease: Secondary | ICD-10-CM | POA: Diagnosis not present

## 2020-12-05 DIAGNOSIS — H3530 Unspecified macular degeneration: Secondary | ICD-10-CM | POA: Diagnosis not present

## 2020-12-05 DIAGNOSIS — I69354 Hemiplegia and hemiparesis following cerebral infarction affecting left non-dominant side: Secondary | ICD-10-CM | POA: Diagnosis not present

## 2020-12-06 DIAGNOSIS — H3530 Unspecified macular degeneration: Secondary | ICD-10-CM | POA: Diagnosis not present

## 2020-12-06 DIAGNOSIS — N184 Chronic kidney disease, stage 4 (severe): Secondary | ICD-10-CM | POA: Diagnosis not present

## 2020-12-06 DIAGNOSIS — I69354 Hemiplegia and hemiparesis following cerebral infarction affecting left non-dominant side: Secondary | ICD-10-CM | POA: Diagnosis not present

## 2020-12-06 DIAGNOSIS — S82841D Displaced bimalleolar fracture of right lower leg, subsequent encounter for closed fracture with routine healing: Secondary | ICD-10-CM | POA: Diagnosis not present

## 2020-12-06 DIAGNOSIS — Z79891 Long term (current) use of opiate analgesic: Secondary | ICD-10-CM | POA: Diagnosis not present

## 2020-12-06 DIAGNOSIS — I129 Hypertensive chronic kidney disease with stage 1 through stage 4 chronic kidney disease, or unspecified chronic kidney disease: Secondary | ICD-10-CM | POA: Diagnosis not present

## 2020-12-06 DIAGNOSIS — E1122 Type 2 diabetes mellitus with diabetic chronic kidney disease: Secondary | ICD-10-CM | POA: Diagnosis not present

## 2020-12-06 DIAGNOSIS — Z9181 History of falling: Secondary | ICD-10-CM | POA: Diagnosis not present

## 2020-12-12 DIAGNOSIS — I69354 Hemiplegia and hemiparesis following cerebral infarction affecting left non-dominant side: Secondary | ICD-10-CM | POA: Diagnosis not present

## 2020-12-12 DIAGNOSIS — I129 Hypertensive chronic kidney disease with stage 1 through stage 4 chronic kidney disease, or unspecified chronic kidney disease: Secondary | ICD-10-CM | POA: Diagnosis not present

## 2020-12-12 DIAGNOSIS — Z79891 Long term (current) use of opiate analgesic: Secondary | ICD-10-CM | POA: Diagnosis not present

## 2020-12-12 DIAGNOSIS — N184 Chronic kidney disease, stage 4 (severe): Secondary | ICD-10-CM | POA: Diagnosis not present

## 2020-12-12 DIAGNOSIS — E1122 Type 2 diabetes mellitus with diabetic chronic kidney disease: Secondary | ICD-10-CM | POA: Diagnosis not present

## 2020-12-12 DIAGNOSIS — S82841D Displaced bimalleolar fracture of right lower leg, subsequent encounter for closed fracture with routine healing: Secondary | ICD-10-CM | POA: Diagnosis not present

## 2020-12-12 DIAGNOSIS — H3530 Unspecified macular degeneration: Secondary | ICD-10-CM | POA: Diagnosis not present

## 2020-12-12 DIAGNOSIS — Z9181 History of falling: Secondary | ICD-10-CM | POA: Diagnosis not present

## 2020-12-13 DIAGNOSIS — Z9181 History of falling: Secondary | ICD-10-CM | POA: Diagnosis not present

## 2020-12-13 DIAGNOSIS — H3530 Unspecified macular degeneration: Secondary | ICD-10-CM | POA: Diagnosis not present

## 2020-12-13 DIAGNOSIS — I129 Hypertensive chronic kidney disease with stage 1 through stage 4 chronic kidney disease, or unspecified chronic kidney disease: Secondary | ICD-10-CM | POA: Diagnosis not present

## 2020-12-13 DIAGNOSIS — S82841D Displaced bimalleolar fracture of right lower leg, subsequent encounter for closed fracture with routine healing: Secondary | ICD-10-CM | POA: Diagnosis not present

## 2020-12-13 DIAGNOSIS — Z79891 Long term (current) use of opiate analgesic: Secondary | ICD-10-CM | POA: Diagnosis not present

## 2020-12-13 DIAGNOSIS — I69354 Hemiplegia and hemiparesis following cerebral infarction affecting left non-dominant side: Secondary | ICD-10-CM | POA: Diagnosis not present

## 2020-12-13 DIAGNOSIS — E1122 Type 2 diabetes mellitus with diabetic chronic kidney disease: Secondary | ICD-10-CM | POA: Diagnosis not present

## 2020-12-13 DIAGNOSIS — N184 Chronic kidney disease, stage 4 (severe): Secondary | ICD-10-CM | POA: Diagnosis not present

## 2020-12-20 DIAGNOSIS — I69354 Hemiplegia and hemiparesis following cerebral infarction affecting left non-dominant side: Secondary | ICD-10-CM | POA: Diagnosis not present

## 2020-12-20 DIAGNOSIS — I129 Hypertensive chronic kidney disease with stage 1 through stage 4 chronic kidney disease, or unspecified chronic kidney disease: Secondary | ICD-10-CM | POA: Diagnosis not present

## 2020-12-20 DIAGNOSIS — E1122 Type 2 diabetes mellitus with diabetic chronic kidney disease: Secondary | ICD-10-CM | POA: Diagnosis not present

## 2020-12-20 DIAGNOSIS — N184 Chronic kidney disease, stage 4 (severe): Secondary | ICD-10-CM | POA: Diagnosis not present

## 2020-12-20 DIAGNOSIS — H3530 Unspecified macular degeneration: Secondary | ICD-10-CM | POA: Diagnosis not present

## 2020-12-20 DIAGNOSIS — Z9181 History of falling: Secondary | ICD-10-CM | POA: Diagnosis not present

## 2020-12-20 DIAGNOSIS — S82841D Displaced bimalleolar fracture of right lower leg, subsequent encounter for closed fracture with routine healing: Secondary | ICD-10-CM | POA: Diagnosis not present

## 2020-12-20 DIAGNOSIS — Z79891 Long term (current) use of opiate analgesic: Secondary | ICD-10-CM | POA: Diagnosis not present

## 2020-12-26 DIAGNOSIS — S82841D Displaced bimalleolar fracture of right lower leg, subsequent encounter for closed fracture with routine healing: Secondary | ICD-10-CM | POA: Diagnosis not present

## 2020-12-26 DIAGNOSIS — Z79891 Long term (current) use of opiate analgesic: Secondary | ICD-10-CM | POA: Diagnosis not present

## 2020-12-26 DIAGNOSIS — I129 Hypertensive chronic kidney disease with stage 1 through stage 4 chronic kidney disease, or unspecified chronic kidney disease: Secondary | ICD-10-CM | POA: Diagnosis not present

## 2020-12-26 DIAGNOSIS — I69952 Hemiplegia and hemiparesis following unspecified cerebrovascular disease affecting left dominant side: Secondary | ICD-10-CM | POA: Diagnosis not present

## 2020-12-26 DIAGNOSIS — E1122 Type 2 diabetes mellitus with diabetic chronic kidney disease: Secondary | ICD-10-CM | POA: Diagnosis not present

## 2020-12-26 DIAGNOSIS — I639 Cerebral infarction, unspecified: Secondary | ICD-10-CM | POA: Diagnosis not present

## 2020-12-26 DIAGNOSIS — S82899A Other fracture of unspecified lower leg, initial encounter for closed fracture: Secondary | ICD-10-CM | POA: Diagnosis not present

## 2020-12-26 DIAGNOSIS — I69354 Hemiplegia and hemiparesis following cerebral infarction affecting left non-dominant side: Secondary | ICD-10-CM | POA: Diagnosis not present

## 2020-12-26 DIAGNOSIS — H3530 Unspecified macular degeneration: Secondary | ICD-10-CM | POA: Diagnosis not present

## 2020-12-26 DIAGNOSIS — S82841A Displaced bimalleolar fracture of right lower leg, initial encounter for closed fracture: Secondary | ICD-10-CM | POA: Diagnosis not present

## 2020-12-26 DIAGNOSIS — Z9181 History of falling: Secondary | ICD-10-CM | POA: Diagnosis not present

## 2020-12-26 DIAGNOSIS — N184 Chronic kidney disease, stage 4 (severe): Secondary | ICD-10-CM | POA: Diagnosis not present

## 2020-12-28 DIAGNOSIS — Z79891 Long term (current) use of opiate analgesic: Secondary | ICD-10-CM | POA: Diagnosis not present

## 2020-12-28 DIAGNOSIS — I69354 Hemiplegia and hemiparesis following cerebral infarction affecting left non-dominant side: Secondary | ICD-10-CM | POA: Diagnosis not present

## 2020-12-28 DIAGNOSIS — Z9181 History of falling: Secondary | ICD-10-CM | POA: Diagnosis not present

## 2020-12-28 DIAGNOSIS — I129 Hypertensive chronic kidney disease with stage 1 through stage 4 chronic kidney disease, or unspecified chronic kidney disease: Secondary | ICD-10-CM | POA: Diagnosis not present

## 2020-12-28 DIAGNOSIS — H3530 Unspecified macular degeneration: Secondary | ICD-10-CM | POA: Diagnosis not present

## 2020-12-28 DIAGNOSIS — N184 Chronic kidney disease, stage 4 (severe): Secondary | ICD-10-CM | POA: Diagnosis not present

## 2020-12-28 DIAGNOSIS — E1122 Type 2 diabetes mellitus with diabetic chronic kidney disease: Secondary | ICD-10-CM | POA: Diagnosis not present

## 2020-12-28 DIAGNOSIS — S82841D Displaced bimalleolar fracture of right lower leg, subsequent encounter for closed fracture with routine healing: Secondary | ICD-10-CM | POA: Diagnosis not present

## 2021-01-01 ENCOUNTER — Ambulatory Visit: Payer: Medicare Other | Admitting: Podiatry

## 2021-01-01 ENCOUNTER — Other Ambulatory Visit: Payer: Self-pay | Admitting: Orthopaedic Surgery

## 2021-01-01 DIAGNOSIS — M25571 Pain in right ankle and joints of right foot: Secondary | ICD-10-CM | POA: Diagnosis not present

## 2021-01-02 NOTE — Progress Notes (Signed)
Katharine Look, the surgery coordinator for Dr. Lucia Gaskins was left a voicemail message explaining that this pt was not appropriate for this center due to the fact that she suffers from left sided hemiplegia from previous CVA and has now suffered Right ankle fx leaving her NWB to the RLE making her a total lift and total assist pt. Pt would be better served as inpatient. PAT nurse will continue to follow and assist as needed.

## 2021-01-04 DIAGNOSIS — Z9181 History of falling: Secondary | ICD-10-CM | POA: Diagnosis not present

## 2021-01-04 DIAGNOSIS — I69354 Hemiplegia and hemiparesis following cerebral infarction affecting left non-dominant side: Secondary | ICD-10-CM | POA: Diagnosis not present

## 2021-01-04 DIAGNOSIS — H3530 Unspecified macular degeneration: Secondary | ICD-10-CM | POA: Diagnosis not present

## 2021-01-04 DIAGNOSIS — E1122 Type 2 diabetes mellitus with diabetic chronic kidney disease: Secondary | ICD-10-CM | POA: Diagnosis not present

## 2021-01-04 DIAGNOSIS — N184 Chronic kidney disease, stage 4 (severe): Secondary | ICD-10-CM | POA: Diagnosis not present

## 2021-01-04 DIAGNOSIS — Z79891 Long term (current) use of opiate analgesic: Secondary | ICD-10-CM | POA: Diagnosis not present

## 2021-01-04 DIAGNOSIS — I129 Hypertensive chronic kidney disease with stage 1 through stage 4 chronic kidney disease, or unspecified chronic kidney disease: Secondary | ICD-10-CM | POA: Diagnosis not present

## 2021-01-04 DIAGNOSIS — S82841D Displaced bimalleolar fracture of right lower leg, subsequent encounter for closed fracture with routine healing: Secondary | ICD-10-CM | POA: Diagnosis not present

## 2021-01-11 ENCOUNTER — Encounter (HOSPITAL_BASED_OUTPATIENT_CLINIC_OR_DEPARTMENT_OTHER): Payer: Self-pay

## 2021-01-11 ENCOUNTER — Ambulatory Visit (HOSPITAL_BASED_OUTPATIENT_CLINIC_OR_DEPARTMENT_OTHER): Admit: 2021-01-11 | Payer: Medicare Other | Admitting: Orthopaedic Surgery

## 2021-01-11 SURGERY — ANKLE ARTHROSCOPY WITH OPEN REDUCTION INTERNAL FIXATION (ORIF)
Anesthesia: General | Site: Ankle | Laterality: Right

## 2021-01-16 DIAGNOSIS — H4312 Vitreous hemorrhage, left eye: Secondary | ICD-10-CM | POA: Diagnosis not present

## 2021-01-16 DIAGNOSIS — H31091 Other chorioretinal scars, right eye: Secondary | ICD-10-CM | POA: Diagnosis not present

## 2021-01-16 DIAGNOSIS — H35372 Puckering of macula, left eye: Secondary | ICD-10-CM | POA: Diagnosis not present

## 2021-01-16 DIAGNOSIS — E113513 Type 2 diabetes mellitus with proliferative diabetic retinopathy with macular edema, bilateral: Secondary | ICD-10-CM | POA: Diagnosis not present

## 2021-01-16 LAB — HM DIABETES EYE EXAM

## 2021-01-18 ENCOUNTER — Ambulatory Visit (INDEPENDENT_AMBULATORY_CARE_PROVIDER_SITE_OTHER): Payer: Medicare Other | Admitting: Orthopedic Surgery

## 2021-01-18 ENCOUNTER — Encounter: Payer: Self-pay | Admitting: Orthopedic Surgery

## 2021-01-18 ENCOUNTER — Other Ambulatory Visit: Payer: Self-pay

## 2021-01-18 ENCOUNTER — Telehealth: Payer: Self-pay

## 2021-01-18 VITALS — BP 138/78 | HR 76 | Ht 62.0 in

## 2021-01-18 DIAGNOSIS — R2681 Unsteadiness on feet: Secondary | ICD-10-CM | POA: Diagnosis not present

## 2021-01-18 DIAGNOSIS — E785 Hyperlipidemia, unspecified: Secondary | ICD-10-CM

## 2021-01-18 DIAGNOSIS — N184 Chronic kidney disease, stage 4 (severe): Secondary | ICD-10-CM

## 2021-01-18 DIAGNOSIS — I69354 Hemiplegia and hemiparesis following cerebral infarction affecting left non-dominant side: Secondary | ICD-10-CM

## 2021-01-18 DIAGNOSIS — E1122 Type 2 diabetes mellitus with diabetic chronic kidney disease: Secondary | ICD-10-CM | POA: Diagnosis not present

## 2021-01-18 DIAGNOSIS — S82841P Displaced bimalleolar fracture of right lower leg, subsequent encounter for closed fracture with malunion: Secondary | ICD-10-CM

## 2021-01-18 DIAGNOSIS — I10 Essential (primary) hypertension: Secondary | ICD-10-CM | POA: Diagnosis not present

## 2021-01-18 DIAGNOSIS — E1169 Type 2 diabetes mellitus with other specified complication: Secondary | ICD-10-CM

## 2021-01-18 DIAGNOSIS — E1022 Type 1 diabetes mellitus with diabetic chronic kidney disease: Secondary | ICD-10-CM | POA: Diagnosis not present

## 2021-01-18 NOTE — Patient Instructions (Addendum)
Start taking aspirin 81 mg po daily  Please consider purchasing Caltrate (calcium and vitamin D) for bone health.   Will plan for bone density test.   Fasting lipid panel next visit- do not eat  Please bring blood pressure and blood sugar logs next visit.

## 2021-01-18 NOTE — Telephone Encounter (Signed)
[name] is calling in from [facility] inquiring about [name]'s vaccination records, tried to look on [facility] but was unable to get in. Wanting to know if we can give them a call with those records.

## 2021-01-18 NOTE — Progress Notes (Signed)
Careteam: Patient Care Team: Orma Flaming, MD as PCP - General (Family Medicine)  Seen by: Windell Moulding, AGNP-C  PLACE OF SERVICE:  [place] Directive information    Allergies  Allergen Reactions   Clonidine Anaphylaxis, Swelling and Other (See Comments)    Tongue swelling   Norvasc [Amlodipine Besylate] Swelling    Edema and TONGUE swelling    Lisinopril Cough   Morphine And Related    Amlodipine Anxiety, Nausea Only and Swelling   Codeine Itching and Rash    Chief Complaint  Patient presents with   New Patient (Initial Visit)    Patient presents today for new patient appointment.     HPI: Patient is a 75 y.o. female seen today to establish at [facility].   Previous PCP was Dr. Orma Flaming at [facility].  Originally from [place], and lives in [place], [place]. Lives with daughter. No pets. Retired Market researcher. Divorced with 2 daughters, one in [place] and one in [place].   CVA in 2019. She presented to PCP and was advised to report to ED. She has left sided weakness of her left upper arm. She is followed by Dr. Wyvonnia Lora with Neuro, advised to see prn. She is not on aspirin or statin at this time.   HTN- diagnosed around age 41. Daughter will take bp tid. Average readings SBP 140 and DBP 78-80. She follows low sodium diet. She denies chest pain, sob, hA and dizziness.   CKD IV- declining kidney function for years. Drinks about 3-4 glasses of water daily. Does not drink soda.   Diabetes- cannot remember age she was diagnosed. Checks sugars daily. Average glucose 120-160's. Given metformin in the past but developed poor kidney function and was taken off medication about 2 years ago. Follows low carb and sugar. Great aunt on mothers side had diabetes.   04/14-04/21 she was hospitalized for fall. She has a right bimalleolar ankle fracture. Followed by Dr. Lucia Gaskins, he recommended surgery due to non-healing. She is in a boot cast. She  plan to see Dr. March Rummage with podiatry for second opinion. Does not exercise at this time due to ankle pain. Will take prn tylenol for pain.   HLD- she does not take a statin at this time. Follows low fat diet.  Family history:  Father- deceased at age 51 due to COPD  Mother- deceased at 48 due to car accident  Immunizations:  Does not wish to have covid vaccine  Believes she has had shingles vaccine about 5 years ago  Unknown about tetanus  Believes she has had     Does not wish to have mammograms, PAP, colonoscopies in the future.  Bone density done about 5-7 years ago in [place].   Eyes- diabetic eye exam 07/13, Dr. Tye Savoy at [facility].   Dentist- followed by Dr. Broadus John in [place], last dental exam 07/2020.  Smoke- non-smoker her whole life. Exposed to second hand smoke as child due to father smoking.   Alcohol- does not drink.   Denies illicit drugs use.   She has a living will, daughter HPOA. Copies requested.               Review of Systems:  Review of Systems  Constitutional:  Negative for chills, fever, malaise/fatigue and weight loss.  HENT: Negative.    Eyes:  Positive for blurred vision and double vision.       Macular degeneration  Respiratory:  Negative for cough, shortness  of breath and wheezing.   Cardiovascular:  Negative for chest pain and leg swelling.  Gastrointestinal: Negative.   Genitourinary:  Negative for dysuria, frequency and hematuria.  Musculoskeletal:  Positive for falls, joint pain and myalgias.       Right ankle fracture, immobility  Skin: Negative.   Neurological:  Positive for weakness. Negative for dizziness and headaches.  Psychiatric/Behavioral:  Negative for depression and memory loss. The patient is not nervous/anxious and does not have insomnia.    Past Medical History:  Diagnosis Date   Allergy    Arthritis    Broken ankle    Cataract    Diabetes mellitus without complication ([place])    History of  colonoscopy    Hypertension    Macular degeneration syndrome    with edema---being treated.    Stage 4 chronic kidney disease ([place])    Stroke ([place]) 2019   Past Surgical History:  Procedure Laterality Date   callous removal  Left 2017   located on 1st digit on L foot 2/2 diabetes   EYE SURGERY     Social History:   reports that she has never smoked. She has never used smokeless tobacco. She reports that she does not drink alcohol and does not use drugs.  Family History  Problem Relation Age of Onset   Kidney disease Mother    Congestive Heart Failure Father    COPD Father    COPD Brother    Diabetes Maternal Aunt     Medications: Patient's Medications  New Prescriptions   No medications on file  Previous Medications   ACETAMINOPHEN (TYLENOL PO)    Take 225 mg by mouth as needed.   CHOLECALCIFEROL (VITAMIN D3) 25 MCG (1000 UNIT) TABLET    Take 1,000 Units by mouth See admin instructions. Take one tablet by mouth on Monday, Wednesdays and Fridays per patient and daughter   LOSARTAN (COZAAR) 25 MG TABLET    TAKE 1 TABLET BY MOUTH TWICE A DAY  Modified Medications   No medications on file  Discontinued Medications   No medications on file    Physical Exam:  Vitals:   01/18/21 1022  BP: 138/78  Pulse: 76  SpO2: 96%  Height: '5\' 2"'$  (1.575 m)   Body mass index is 29.45 kg/m. Wt Readings from Last 3 Encounters:  10/19/20 161 lb (73 kg)  08/18/20 161 lb (73 kg)  07/26/20 164 lb 10.9 oz (74.7 kg)    Physical Exam Vitals reviewed.  Constitutional:      General: She is not in acute distress.    Appearance: She is not ill-appearing.  HENT:     Head: Normocephalic.  Eyes:     General:        Right eye: No discharge.        Left eye: No discharge.  Neck:     Vascular: No carotid bruit.  Cardiovascular:     Rate and Rhythm: Normal rate and regular rhythm.     Pulses: Normal pulses.     Heart sounds: Normal heart sounds. No murmur heard. Pulmonary:     Effort:  Pulmonary effort is normal. No respiratory distress.     Breath sounds: Normal breath sounds. No wheezing.  Abdominal:     General: Bowel sounds are normal. There is no distension.     Palpations: Abdomen is soft.     Tenderness: There is no abdominal tenderness.  Musculoskeletal:     Cervical back: Normal range of motion.  Right lower leg: No edema.     Left lower leg: No edema.     Comments: Right ankle with tenderness and swelling over medial side. Limited ROM, dorsal pedis 1+.   Lymphadenopathy:     Cervical: No cervical adenopathy.  Skin:    General: Skin is warm and dry.     Capillary Refill: Capillary refill takes less than 2 seconds.  Neurological:     General: No focal deficit present.     Mental Status: She is alert and oriented to person, place, and time.     Motor: Weakness present.     Gait: Gait abnormal.     Comments: wheelchair  Psychiatric:        Mood and Affect: Mood normal.        Behavior: Behavior normal.    Labs reviewed: Basic Metabolic Panel: Recent Labs    04/19/20 1103 08/18/20 1153 08/18/20 1153 10/19/20 1036 10/21/20 0127 10/22/20 0138 10/24/20 0526  NA 139   < >  --    < > 139 136 139  K 4.4   < >  --    < > 3.9 3.7 3.9  CL 106   < >  --    < > 110 109 109  CO2 23   < >  --    < > 20* 20* 22  GLUCOSE 122*   < >  --    < > 115* 133* 123*  BUN 28*   < >  --    < > 33* 28* 28*  CREATININE 1.82*   < >  --    < > 2.19* 1.86* 1.90*  CALCIUM 9.1   < >  --    < > 8.7* 8.8* 8.8*  MG  --   --  2.3  --  1.9 1.8  --   TSH 2.16  --   --   --   --   --   --    < > = values in this interval not displayed.   Liver Function Tests: Recent Labs    04/19/20 1103 08/18/20 1153  AST 13 13  ALT 13 13  BILITOT 0.4 0.5  PROT 6.5 6.7   No results for input(s): LIPASE, AMYLASE in the last 8760 hours. No results for input(s): AMMONIA in the last 8760 hours. CBC: Recent Labs    04/19/20 1103 08/18/20 1153 10/19/20 1036 10/20/20 0137  10/21/20 0127 10/22/20 0138  WBC 8.4 9.1   < > 9.3 8.6 10.2  NEUTROABS 5,124 6.1  --   --  5.5  --   HGB 13.5 14.0   < > 11.0* 10.6* 11.2*  HCT 41.1 41.3   < > 34.0* 32.1* 33.5*  MCV 86.7 85.9   < > 87.2 87.0 84.4  PLT 270 258.0   < > 266 241 269   < > = values in this interval not displayed.   Lipid Panel: Recent Labs    04/19/20 1103 08/18/20 1153  CHOL 244* 250*  HDL 37* 38.10*  LDLCALC 148*  --   TRIG 381* 351.0*  CHOLHDL 6.6* 7  LDLDIRECT  --  149.0   TSH: Recent Labs    04/19/20 1103  TSH 2.16   A1C: Lab Results  Component Value Date   HGBA1C 7.3 (H) 08/18/2020     Assessment/Plan 1. Essential (primary) hypertension - controlled - cont losartan 25 mg daily - advised to bring blood pressure journal next visit - CBC  with Differential/Platelet - CMP  2. Type 2 diabetes mellitus with stage 4 chronic kidney disease, without long-term current use of insulin ([place]) - reports average blood glucose 120-160's - A1c 7.3 08/18/2020 - no longer on metformin due to poor kidney function - continue diet low in sugar and carbs - Hemoglobin A1c  3. Hemiplegia and hemiparesis following cerebral infarction affecting left non-dominant side ([place]) - CVA 02/2018, upper left arm weakness after event - MRI head revealed infarct within right superior frontoparietal junction  - start aspirin 81 mg daily  4. Unstable gait - due to right ankle fracture - consider PT in future for weakness  5. Closed bimalleolar fracture of right ankle with malunion, subsequent encounter - due to fall 04/14 - Followed by Dr. Lucia Gaskins, upset she needs surgery - seeking second opinion from Dr. March Rummage soon  - cont Cam Boot - recommend having surgery to promote mobility as long as she can  6. Hyperlipidemia associated with type 2 diabetes mellitus ([place]) - total cholesterol 250 08/18/2020 - LDL 149 08/18/2020 - cont low fat diet, recommend mediterranean diet - lipid panel - 8 weeks   Total time:  51 minutes. Greater than 50% of total time spent doing patient education, discussing medication management and falls prevention.    Labs/tests: she will need fasting lipid panel, MMSE next appointment, discuss DEXA scan  Next appt: Visit date not found  [place], [facility] [phone]

## 2021-01-18 NOTE — Telephone Encounter (Signed)
Looked in [place], no record of any immunizations.

## 2021-01-19 ENCOUNTER — Encounter: Payer: Self-pay | Admitting: Orthopedic Surgery

## 2021-01-19 ENCOUNTER — Other Ambulatory Visit: Payer: Self-pay | Admitting: Orthopedic Surgery

## 2021-01-19 DIAGNOSIS — N184 Chronic kidney disease, stage 4 (severe): Secondary | ICD-10-CM

## 2021-01-19 LAB — CBC WITH DIFFERENTIAL/PLATELET
Absolute Monocytes: 654 cells/uL (ref 200–950)
Basophils Absolute: 103 cells/uL (ref 0–200)
Basophils Relative: 1.2 %
Eosinophils Absolute: 361 cells/uL (ref 15–500)
Eosinophils Relative: 4.2 %
HCT: 40.6 % (ref 35.0–45.0)
Hemoglobin: 13.3 g/dL (ref 11.7–15.5)
Lymphs Abs: 1668 cells/uL (ref 850–3900)
MCH: 27.7 pg (ref 27.0–33.0)
MCHC: 32.8 g/dL (ref 32.0–36.0)
MCV: 84.6 fL (ref 80.0–100.0)
MPV: 10.7 fL (ref 7.5–12.5)
Monocytes Relative: 7.6 %
Neutro Abs: 5814 cells/uL (ref 1500–7800)
Neutrophils Relative %: 67.6 %
Platelets: 334 10*3/uL (ref 140–400)
RBC: 4.8 10*6/uL (ref 3.80–5.10)
RDW: 14.1 % (ref 11.0–15.0)
Total Lymphocyte: 19.4 %
WBC: 8.6 10*3/uL (ref 3.8–10.8)

## 2021-01-19 LAB — COMPREHENSIVE METABOLIC PANEL
AG Ratio: 1.5 (calc) (ref 1.0–2.5)
ALT: 12 U/L (ref 6–29)
AST: 11 U/L (ref 10–35)
Albumin: 3.9 g/dL (ref 3.6–5.1)
Alkaline phosphatase (APISO): 93 U/L (ref 37–153)
BUN/Creatinine Ratio: 16 (calc) (ref 6–22)
BUN: 32 mg/dL — ABNORMAL HIGH (ref 7–25)
CO2: 25 mmol/L (ref 20–32)
Calcium: 9.6 mg/dL (ref 8.6–10.4)
Chloride: 104 mmol/L (ref 98–110)
Creat: 1.98 mg/dL — ABNORMAL HIGH (ref 0.60–1.00)
Globulin: 2.6 g/dL (calc) (ref 1.9–3.7)
Glucose, Bld: 154 mg/dL — ABNORMAL HIGH (ref 65–139)
Potassium: 4.4 mmol/L (ref 3.5–5.3)
Sodium: 138 mmol/L (ref 135–146)
Total Bilirubin: 0.3 mg/dL (ref 0.2–1.2)
Total Protein: 6.5 g/dL (ref 6.1–8.1)

## 2021-01-19 LAB — HEMOGLOBIN A1C
Hgb A1c MFr Bld: 7 % of total Hgb — ABNORMAL HIGH (ref <5.7)
Mean Plasma Glucose: 154 mg/dL
eAG (mmol/L): 8.5 mmol/L

## 2021-01-19 MED ORDER — GLIMEPIRIDE 1 MG PO TABS: 1.0000 mg | ORAL_TABLET | Freq: Every day | ORAL | 3 refills | Status: DC

## 2021-01-19 NOTE — Telephone Encounter (Signed)
Spoke to [name], told her there are no record of any immunizations in [place]. [name] verbalized understanding.

## 2021-01-22 ENCOUNTER — Ambulatory Visit (INDEPENDENT_AMBULATORY_CARE_PROVIDER_SITE_OTHER): Payer: Medicare Other | Admitting: Podiatry

## 2021-01-22 ENCOUNTER — Other Ambulatory Visit: Payer: Self-pay | Admitting: Orthopedic Surgery

## 2021-01-22 ENCOUNTER — Ambulatory Visit (INDEPENDENT_AMBULATORY_CARE_PROVIDER_SITE_OTHER): Payer: Medicare Other

## 2021-01-22 ENCOUNTER — Other Ambulatory Visit: Payer: Self-pay

## 2021-01-22 DIAGNOSIS — S82892P Other fracture of left lower leg, subsequent encounter for closed fracture with malunion: Secondary | ICD-10-CM | POA: Diagnosis not present

## 2021-01-22 DIAGNOSIS — M25372 Other instability, left ankle: Secondary | ICD-10-CM | POA: Diagnosis not present

## 2021-01-22 DIAGNOSIS — S82841P Displaced bimalleolar fracture of right lower leg, subsequent encounter for closed fracture with malunion: Secondary | ICD-10-CM

## 2021-01-22 MED ORDER — KETOCONAZOLE 2 % EX CREA: TOPICAL_CREAM | CUTANEOUS | 0 refills | Status: DC

## 2021-01-25 DIAGNOSIS — S82841A Displaced bimalleolar fracture of right lower leg, initial encounter for closed fracture: Secondary | ICD-10-CM | POA: Diagnosis not present

## 2021-01-25 DIAGNOSIS — N184 Chronic kidney disease, stage 4 (severe): Secondary | ICD-10-CM | POA: Diagnosis not present

## 2021-01-25 DIAGNOSIS — I69952 Hemiplegia and hemiparesis following unspecified cerebrovascular disease affecting left dominant side: Secondary | ICD-10-CM | POA: Diagnosis not present

## 2021-01-25 DIAGNOSIS — S82899A Other fracture of unspecified lower leg, initial encounter for closed fracture: Secondary | ICD-10-CM | POA: Diagnosis not present

## 2021-01-25 DIAGNOSIS — I639 Cerebral infarction, unspecified: Secondary | ICD-10-CM | POA: Diagnosis not present

## 2021-01-26 ENCOUNTER — Other Ambulatory Visit: Payer: Self-pay | Admitting: Orthopedic Surgery

## 2021-01-26 ENCOUNTER — Telehealth: Payer: Self-pay | Admitting: Orthopedic Surgery

## 2021-01-26 DIAGNOSIS — I10 Essential (primary) hypertension: Secondary | ICD-10-CM

## 2021-01-26 DIAGNOSIS — N184 Chronic kidney disease, stage 4 (severe): Secondary | ICD-10-CM

## 2021-01-26 DIAGNOSIS — Z01818 Encounter for other preprocedural examination: Secondary | ICD-10-CM

## 2021-01-26 NOTE — Telephone Encounter (Signed)
I called patients daughter to schedule a follow up for her so Amy can check on some things before signing a surgical clearance. Amy also wanted labwork done to check on the new medication the patient started but the daughter stated that Onetta stopped taking that medication because it made her stomach upset. So she didn't know if she would still need labwork or not. I told her she could message Amy on Mychart about if she still needs labs or not if she has not been taking the new medication. The daughter told me she would call me back to schedule a day with her mom to come and do a follow up with Amy and get the surgical clearance signed.

## 2021-01-26 NOTE — Telephone Encounter (Signed)
Thank you for update. We do not have to do labs if she is not taking medication. I only have a EKG from 2019 in epic, this will need to be done for surgical clearance.

## 2021-01-29 ENCOUNTER — Encounter: Payer: Self-pay | Admitting: Podiatry

## 2021-01-29 NOTE — Progress Notes (Signed)
  Subjective:  Patient ID: [name], female    DOB: April 03, 1946,  MRN: [id number]  No chief complaint on file.  75 y.o. female presents for follow-up. Denies new pedal issues.Here for eval for left ankle injury 3 months ago - originally was planned for non-operative treatment but now tod she needs surgery. Surgery was canceled recently. Wants 2nd opinion as to the planned surgery.  History also obtained from chart. Objective:  Physical Exam: warm, good capillary refill, nail exam onychomycosis of the toenails, no trophic changes or ulcerative lesions, normal DP and PT pulses, and normal sensory exam.  Left Foot: POP left ankle both gutters, mild varus deformity. Mild edema. No bruising, contusions, skin breaks  Assessment:   1. Instability of left ankle joint   2. Closed fracture dislocation of left ankle with malunion, subsequent encounter    Plan:  Patient was evaluated and treated and all questions answered.  Left ankle fracture -Reviwed XR. Patient was unable to stand for XR today, limiting quality of images. -I did advise that I agree with Dr. Pollie Friar surgical plan. She has a complex injury and this would best be treated with fusion. Advise she continue with current planned treatment.  Return in about 3 months (around 04/24/2021) for Diabetic Foot Care.

## 2021-01-30 ENCOUNTER — Other Ambulatory Visit: Payer: Self-pay | Admitting: Podiatry

## 2021-01-30 ENCOUNTER — Telehealth: Payer: Self-pay | Admitting: Orthopedic Surgery

## 2021-01-30 DIAGNOSIS — M25372 Other instability, left ankle: Secondary | ICD-10-CM

## 2021-01-30 DIAGNOSIS — S82892P Other fracture of left lower leg, subsequent encounter for closed fracture with malunion: Secondary | ICD-10-CM

## 2021-01-30 NOTE — Telephone Encounter (Signed)
Just spoke with [name]'s daughter [name] & she stated that pt did call [facility] to inquire about surgery, within the last week pt has decided that she is not ready for surgery & ortho stated that if she waited over 2wks to decided on surgery, she would require another clearance.   Per [name] daughter , they have decided to wait on everything at this time No surgery No clearance  Thanks, Vilinda Blanks

## 2021-02-01 ENCOUNTER — Encounter: Payer: Self-pay | Admitting: Orthopedic Surgery

## 2021-02-15 ENCOUNTER — Telehealth: Payer: Self-pay | Admitting: *Deleted

## 2021-02-15 NOTE — Telephone Encounter (Signed)
[name] transferred a call that was on her voicemail from patient.   Patient stated on Voicemail that she was wanting a Antidepressant due to Neuropathy in legs.   Tried calling patient and LMOM to return call.

## 2021-02-20 NOTE — Telephone Encounter (Signed)
Spoke with daughter and patient and they stated that they will wait until patient's broken ankle heals before coming in to discuss Antidepressant.

## 2021-02-25 DIAGNOSIS — N184 Chronic kidney disease, stage 4 (severe): Secondary | ICD-10-CM | POA: Diagnosis not present

## 2021-02-25 DIAGNOSIS — S82841A Displaced bimalleolar fracture of right lower leg, initial encounter for closed fracture: Secondary | ICD-10-CM | POA: Diagnosis not present

## 2021-02-25 DIAGNOSIS — S82899A Other fracture of unspecified lower leg, initial encounter for closed fracture: Secondary | ICD-10-CM | POA: Diagnosis not present

## 2021-02-25 DIAGNOSIS — I69952 Hemiplegia and hemiparesis following unspecified cerebrovascular disease affecting left dominant side: Secondary | ICD-10-CM | POA: Diagnosis not present

## 2021-02-25 DIAGNOSIS — I639 Cerebral infarction, unspecified: Secondary | ICD-10-CM | POA: Diagnosis not present

## 2021-02-27 ENCOUNTER — Encounter: Payer: Self-pay | Admitting: Orthopedic Surgery

## 2021-02-27 ENCOUNTER — Other Ambulatory Visit: Payer: Self-pay | Admitting: Orthopedic Surgery

## 2021-02-27 DIAGNOSIS — S82841P Displaced bimalleolar fracture of right lower leg, subsequent encounter for closed fracture with malunion: Secondary | ICD-10-CM

## 2021-02-27 DIAGNOSIS — I69354 Hemiplegia and hemiparesis following cerebral infarction affecting left non-dominant side: Secondary | ICD-10-CM

## 2021-03-06 ENCOUNTER — Encounter: Payer: Self-pay | Admitting: Orthopedic Surgery

## 2021-03-06 ENCOUNTER — Encounter: Payer: Self-pay | Admitting: Podiatry

## 2021-03-07 ENCOUNTER — Other Ambulatory Visit: Payer: Self-pay

## 2021-03-07 ENCOUNTER — Telehealth (INDEPENDENT_AMBULATORY_CARE_PROVIDER_SITE_OTHER): Payer: Medicare Other | Admitting: Adult Health

## 2021-03-07 DIAGNOSIS — E1122 Type 2 diabetes mellitus with diabetic chronic kidney disease: Secondary | ICD-10-CM

## 2021-03-07 DIAGNOSIS — N184 Chronic kidney disease, stage 4 (severe): Secondary | ICD-10-CM

## 2021-03-07 DIAGNOSIS — R52 Pain, unspecified: Secondary | ICD-10-CM

## 2021-03-07 DIAGNOSIS — J069 Acute upper respiratory infection, unspecified: Secondary | ICD-10-CM | POA: Diagnosis not present

## 2021-03-07 MED ORDER — AZITHROMYCIN 250 MG PO TABS: ORAL_TABLET | ORAL | 0 refills | Status: AC

## 2021-03-07 MED ORDER — SACCHAROMYCES BOULARDII 250 MG PO CAPS: 250.0000 mg | ORAL_CAPSULE | Freq: Two times a day (BID) | ORAL | 0 refills | Status: AC

## 2021-03-07 MED ORDER — ACETAMINOPHEN ER 650 MG PO TBCR: 650.0000 mg | EXTENDED_RELEASE_TABLET | Freq: Three times a day (TID) | ORAL | 2 refills | Status: DC | PRN

## 2021-03-07 NOTE — Patient Instructions (Signed)
Upper Respiratory Infection, Adult An upper respiratory infection (URI) is a common viral infection of the nose, throat, and upper air passages that lead to the lungs. The most common type of URI is the common cold. URIs usually get better on their own, without medical treatment. What are the causes? A URI is caused by a virus. You may catch a virus by: Breathing in droplets from an infected person's cough or sneeze. Touching something that has been exposed to the virus (contaminated) and then touching your mouth, nose, or eyes. What increases the risk? You are more likely to get a URI if: You are very young or very old. It is autumn or winter. You have close contact with others, such as at a daycare, school, or health care facility. You smoke. You have long-term (chronic) heart or lung disease. You have a weakened disease-fighting (immune) system. You have nasal allergies or asthma. You are experiencing a lot of stress. You work in an area that has poor air circulation. You have poor nutrition. What are the signs or symptoms? A URI usually involves some of the following symptoms: Runny or stuffy (congested) nose. Sneezing. Cough. Sore throat. Headache. Fatigue. Fever. Loss of appetite. Pain in your forehead, behind your eyes, and over your cheekbones (sinus pain). Muscle aches. Redness or irritation of the eyes. Pressure in the ears or face. How is this diagnosed? This condition may be diagnosed based on your medical history and symptoms, and a physical exam. Your health care provider may use a cotton swab to take a mucus sample from your nose (nasal swab). This sample can be tested to determine what virus is causing the illness. How is this treated? URIs usually get better on their own within 7-10 days. You can take steps at home to relieve your symptoms. Medicines cannot cure URIs, but your health care provider may recommend certain medicines to help relieve symptoms, such  as: Over-the-counter cold medicines. Cough suppressants. Coughing is a type of defense against infection that helps to clear the respiratory system, so take these medicines only as recommended by your health care provider. Fever-reducing medicines. Follow these instructions at home: Activity Rest as needed. If you have a fever, stay home from work or school until your fever is gone or until your health care provider says you are no longer contagious. Your health care provider may have you wear a face mask to prevent your infection from spreading. Relieving symptoms Gargle with a salt-water mixture 3-4 times a day or as needed. To make a salt-water mixture, completely dissolve -1 tsp of salt in 1 cup of warm water. Use a cool-mist humidifier to add moisture to the air. This can help you breathe more easily. Eating and drinking  Drink enough fluid to keep your urine pale yellow. Eat soups and other clear broths. General instructions  Take over-the-counter and prescription medicines only as told by your health care provider. These include cold medicines, fever reducers, and cough suppressants. Do not use any products that contain nicotine or tobacco, such as cigarettes and e-cigarettes. If you need help quitting, ask your health care provider. Stay away from secondhand smoke. Stay up to date on all immunizations, including the yearly (annual) flu vaccine. Keep all follow-up visits as told by your health care provider. This is important. How to prevent the spread of infection to others  URIs can be passed from person to person (are contagious). To prevent the infection from spreading: Wash your hands often with soap and   water. If soap and water are not available, use hand sanitizer. Avoid touching your mouth, face, eyes, or nose. Cough or sneeze into a tissue or your sleeve or elbow instead of into your hand or into the air. Contact a health care provider if: You are getting worse instead  of better. You have a fever or chills. Your mucus is brown or red. You have yellow or brown discharge coming from your nose. You have pain in your face, especially when you bend forward. You have swollen neck glands. You have pain while swallowing. You have white areas in the back of your throat. Get help right away if: You have shortness of breath that gets worse. You have severe or persistent: Headache. Ear pain. Sinus pain. Chest pain. You have chronic lung disease along with any of the following: Wheezing. Prolonged cough. Coughing up blood. A change in your usual mucus. You have a stiff neck. You have changes in your: Vision. Hearing. Thinking. Mood. Summary An upper respiratory infection (URI) is a common infection of the nose, throat, and upper air passages that lead to the lungs. A URI is caused by a virus. URIs usually get better on their own within 7-10 days. Medicines cannot cure URIs, but your health care provider may recommend certain medicines to help relieve symptoms. This information is not intended to replace advice given to you by your health care provider. Make sure you discuss any questions you have with your health care provider. Document Revised: 03/02/2020 Document Reviewed: 03/02/2020 Elsevier Patient Education  2022 [employer] Inc.  

## 2021-03-07 NOTE — Progress Notes (Addendum)
This service is provided via telemedicine  No vital signs collected/recorded due to the encounter was a telemedicine visit.   Location of patient (ex: home, work): Home  Patient consents to a video visit:  Yes, see encounter dated 03/07/2021  Location of the provider (ex: office, home):  [facility]  Name of any referring provider:  Windell Moulding, NP  Names of all persons participating in the telemedicine service and their role in the encounter:  [name] Age, NP, Carroll Kinds, CMA, patient and daughter [name]  Time spent on call:  8 minutes with medical assistant     DATE:  03/07/2021 MRN:  [id number]  BIRTHDAY: [birth year]   Contact Information     Name Relation Home Work Mobile   [place] Daughter [phone]     Cabanilla,[name] Other   [phone]        Code Status History     Date Active Date Inactive Code Status Order ID Comments User Context   10/22/2020 1834 10/27/2020 0216 Partial Code [id number]  Florencia Reasons, MD Inpatient   10/19/2020 1348 10/22/2020 1834 DNR [id number]  Karmen Bongo, MD ED   09/21/2018 1743 09/27/2018 1536 Full Code [id number]  Annita Brod, MD Inpatient   02/27/2018 1232 03/14/2018 1215 Full Code [id number]  Bary Leriche, PA-C Inpatient   02/24/2018 2113 02/27/2018 1225 Full Code [id number]  Annita Brod, MD ED      Questions for Most Recent Historical Code Status (Order [id number])     Question Answer   In the event of cardiac or respiratory ARREST: Initiate Code Blue, Call Rapid Response Yes   In the event of cardiac or respiratory ARREST: Perform CPR Yes   In the event of cardiac or respiratory ARREST: Perform Intubation/Mechanical Ventilation No   In the event of cardiac or respiratory ARREST: Use NIPPV/BiPAp only if indicated Yes   In the event of cardiac or respiratory ARREST: Administer ACLS medications if indicated Yes   In the event of cardiac or respiratory ARREST: Perform Defibrillation or  Cardioversion if indicated Yes        Chief Complaint  Patient presents with   Acute Visit    Complains of cold like symptoms. Patient took COVID test and it was negative. Patient has runny nose. Questions sinus infection.Symptoms about 4-5 days. Would like to discuss blood sugar.    HISTORY OF PRESENT ILLNESS:   This is a 75 year old female who is having a video visit. She has her daughter with her today during the video visit. Patient has been having dry cough and yellowish nasal drainage. She has drainage coming from the back of her throat. No fever nor chills. Her symptoms started 5 days ago. She has good appetite. Daughter is concerned that she might be developing pneumonia. She has diabetes mellitus with CKD stage IV, latest A1C  7.0, 01/18/21. Daughter stated that her CBGs are in the 150s but occasionally would have 200s when she tests it 2 hours after eating. Daughter requested for refill of acetaminophen for her generalized pain.   PAST MEDICAL HISTORY:  Past Medical History:  Diagnosis Date   Allergy    Arthritis    Broken ankle    Cataract    Diabetes mellitus without complication ([place])    History of colonoscopy    Hypertension    Macular degeneration syndrome    with edema---being treated.    Stage 4 chronic kidney disease ([place])    Stroke ([place]) 2019  CURRENT MEDICATIONS: Reviewed  Patient's Medications  New Prescriptions   ACETAMINOPHEN (RA ACETAMINOPHEN) 650 MG CR TABLET    Take 1 tablet (650 mg total) by mouth every 8 (eight) hours as needed for pain.   AZITHROMYCIN (ZITHROMAX) 250 MG TABLET    Take 2 tablets on day 1, then 1 tablet daily on days 2 through 5  Previous Medications   ASPIRIN EC 81 MG TABLET    Take 81 mg by mouth daily. Swallow whole.   CHOLECALCIFEROL (VITAMIN D3) 25 MCG (1000 UNIT) TABLET    Take 1,000 Units by mouth 2 (two) times a week.   LOSARTAN (COZAAR) 25 MG TABLET    TAKE 1 TABLET BY MOUTH TWICE A DAY   ONETOUCH VERIO TEST STRIP       Modified Medications   No medications on file  Discontinued Medications   ACETAMINOPHEN (TYLENOL PO)    Take 225 mg by mouth as needed.   GABAPENTIN (NEURONTIN) 300 MG CAPSULE    Take 300 mg by mouth at bedtime.   GLIMEPIRIDE (AMARYL) 1 MG TABLET    Take 1 tablet (1 mg total) by mouth daily with breakfast.   KETOCONAZOLE (NIZORAL) 2 % CREAM    Apply 1 fingertip amount to each foot daily.     Allergies  Allergen Reactions   Clonidine Anaphylaxis, Swelling and Other (See Comments)    Tongue swelling   Norvasc [Amlodipine Besylate] Swelling    Edema and TONGUE swelling    Lisinopril Cough   Morphine And Related    Amlodipine Anxiety, Nausea Only and Swelling   Codeine Itching and Rash     REVIEW OF SYSTEMS:  GENERAL: no change in appetite, no fatigue, no weight changes, no fever, chills or weakness MOUTH and THROAT: Denies oral discomfort, gingival pain or bleeding RESPIRATORY: + dry cough, no SOB CARDIAC: no chest pain, edema or palpitations GI: no abdominal pain, diarrhea, constipation, heart burn, nausea or vomiting GU: Denies dysuria, frequency, hematuria  or discharge NEUROLOGICAL: Denies dizziness, syncope, numbness, or headache PSYCHIATRIC: Denies feeling of depression or anxiety. No report of hallucinations, insomnia, paranoia, or agitation   LABS/RADIOLOGY: Labs reviewed: Basic Metabolic Panel: Recent Labs    08/18/20 1153 10/19/20 1036 10/21/20 0127 10/22/20 0138 10/24/20 0526 01/18/21 1152  NA  --    < > 139 136 139 138  K  --    < > 3.9 3.7 3.9 4.4  CL  --    < > 110 109 109 104  CO2  --    < > 20* 20* 22 25  GLUCOSE  --    < > 115* 133* 123* 154*  BUN  --    < > 33* 28* 28* 32*  CREATININE  --    < > 2.19* 1.86* 1.90* 1.98*  CALCIUM  --    < > 8.7* 8.8* 8.8* 9.6  MG 2.3  --  1.9 1.8  --   --    < > = values in this interval not displayed.   Liver Function Tests: Recent Labs    04/19/20 1103 08/18/20 1153 01/18/21 1152  AST '13 13 11  '$ ALT '13  13 12  '$ BILITOT 0.4 0.5 0.3  PROT 6.5 6.7 6.5   No results for input(s): LIPASE, AMYLASE in the last 8760 hours. No results for input(s): AMMONIA in the last 8760 hours. CBC: Recent Labs    08/18/20 1153 10/19/20 1036 10/21/20 0127 10/22/20 0138 01/18/21 1152  WBC 9.1   < >  8.6 10.2 8.6  NEUTROABS 6.1  --  5.5  --  5,814  HGB 14.0   < > 10.6* 11.2* 13.3  HCT 41.3   < > 32.1* 33.5* 40.6  MCV 85.9   < > 87.0 84.4 84.6  PLT 258.0   < > 241 269 334   < > = values in this interval not displayed.   A1C: Invalid input(s): A1C Lipid Panel: Recent Labs    04/19/20 1103 08/18/20 1153  HDL 37* 38.10*   Cardiac Enzymes: No results for input(s): CKTOTAL, CKMB, CKMBINDEX, TROPONINI in the last 8760 hours. BNP: Invalid input(s): POCBNP CBG: Recent Labs    10/26/20 0637 10/26/20 1235 10/26/20 1732  GLUCAP 127* 151* 169*      No results found.  ASSESSMENT/PLAN: 1. URI, acute -  encouraged to drink fluids and eat healthy meals - azithromycin (ZITHROMAX) 250 MG tablet; Take 2 tablets on day 1, then 1 tablet daily on days 2 through 5  Dispense: 6 tablet; Refill: 0 - saccharomyces boulardii (FLORASTOR) 250 MG capsule; Take 1 capsule (250 mg total) by mouth 2 (two) times daily for 8 days.  Dispense: 16 capsule; Refill: 0  2. Type 2 diabetes mellitus with stage 4 chronic kidney disease, without long-term current use of insulin (HCC) Lab Results  Component Value Date   HGBA1C 7.0 (H) 01/18/2021   -   diet-controlled   3. Generalized pain - acetaminophen (RA ACETAMINOPHEN) 650 MG CR tablet; Take 1 tablet (650 mg total) by mouth every 8 (eight) hours as needed for pain.  Dispense: 100 tablet; Refill: 2      Time spent on non face to face visit:  10 minutes  The patient gave consent to this video visit. Explained to the patient the risk and privacy issue that was involved with this video call.   The patient was advised to call back and ask for an in-person evaluation if  the symptoms worsen or if the condition fails to improve.   [name] Age, NP [employer] [phone]

## 2021-03-13 ENCOUNTER — Encounter: Payer: Self-pay | Admitting: Orthopedic Surgery

## 2021-03-14 ENCOUNTER — Encounter: Payer: Self-pay | Admitting: Adult Health

## 2021-03-20 ENCOUNTER — Other Ambulatory Visit: Payer: Medicare Other

## 2021-03-28 DIAGNOSIS — N184 Chronic kidney disease, stage 4 (severe): Secondary | ICD-10-CM | POA: Diagnosis not present

## 2021-03-28 DIAGNOSIS — I69952 Hemiplegia and hemiparesis following unspecified cerebrovascular disease affecting left dominant side: Secondary | ICD-10-CM | POA: Diagnosis not present

## 2021-03-28 DIAGNOSIS — S82841A Displaced bimalleolar fracture of right lower leg, initial encounter for closed fracture: Secondary | ICD-10-CM | POA: Diagnosis not present

## 2021-03-28 DIAGNOSIS — I639 Cerebral infarction, unspecified: Secondary | ICD-10-CM | POA: Diagnosis not present

## 2021-03-28 DIAGNOSIS — S82899A Other fracture of unspecified lower leg, initial encounter for closed fracture: Secondary | ICD-10-CM | POA: Diagnosis not present

## 2021-04-05 NOTE — Telephone Encounter (Signed)
Error

## 2021-04-11 ENCOUNTER — Encounter: Payer: Self-pay | Admitting: Family Medicine

## 2021-04-19 ENCOUNTER — Other Ambulatory Visit: Payer: Self-pay | Admitting: Orthopedic Surgery

## 2021-04-19 DIAGNOSIS — N184 Chronic kidney disease, stage 4 (severe): Secondary | ICD-10-CM

## 2021-04-19 DIAGNOSIS — E1122 Type 2 diabetes mellitus with diabetic chronic kidney disease: Secondary | ICD-10-CM

## 2021-04-24 ENCOUNTER — Telehealth: Payer: Self-pay

## 2021-04-24 ENCOUNTER — Other Ambulatory Visit: Payer: Self-pay

## 2021-04-24 ENCOUNTER — Telehealth (INDEPENDENT_AMBULATORY_CARE_PROVIDER_SITE_OTHER): Payer: Medicare Other | Admitting: Nurse Practitioner

## 2021-04-24 DIAGNOSIS — R2681 Unsteadiness on feet: Secondary | ICD-10-CM | POA: Diagnosis not present

## 2021-04-24 DIAGNOSIS — I69354 Hemiplegia and hemiparesis following cerebral infarction affecting left non-dominant side: Secondary | ICD-10-CM | POA: Diagnosis not present

## 2021-04-24 DIAGNOSIS — M25371 Other instability, right ankle: Secondary | ICD-10-CM

## 2021-04-24 DIAGNOSIS — S82841P Displaced bimalleolar fracture of right lower leg, subsequent encounter for closed fracture with malunion: Secondary | ICD-10-CM | POA: Diagnosis not present

## 2021-04-24 NOTE — Progress Notes (Signed)
This service is provided via telemedicine  No vital signs collected/recorded due to the encounter was a telemedicine visit.   Location of patient (ex: home, work):  Home  Patient consents to a telephone visit:  Yes, see encounter dated 04/24/2021  Location of the provider (ex: office, home):  [place]  Name of any referring provider:  Windell Moulding, NP  Names of all persons participating in the telemedicine service and their role in the encounter:  Sherrie Mustache, Nurse Practitioner, Carroll Kinds, CMA, patient and [name], daughter.  Time spent on call:  6 minutes with medical assistant

## 2021-04-24 NOTE — Telephone Encounter (Signed)
[name], wombacher are scheduled for a virtual visit with your provider today.    Just as we do with appointments in the office, we must obtain your consent to participate.  Your consent will be active for this visit and any virtual visit you may have with one of our providers in the next 365 days.    If you have a MyChart account, I can also send a copy of this consent to you electronically.  All virtual visits are billed to your insurance company just like a traditional visit in the office.  As this is a virtual visit, video technology does not allow for your provider to perform a traditional examination.  This may limit your provider's ability to fully assess your condition.  If your provider identifies any concerns that need to be evaluated in person or the need to arrange testing such as labs, EKG, etc, we will make arrangements to do so.    Although advances in technology are sophisticated, we cannot ensure that it will always work on either your end or our end.  If the connection with a video visit is poor, we may have to switch to a telephone visit.  With either a video or telephone visit, we are not always able to ensure that we have a secure connection.   I need to obtain your verbal consent now.   Are you willing to proceed with your visit today?   [name] has provided verbal consent on 04/24/2021 for a virtual visit (video or telephone).   Carroll Kinds, CMA 04/24/2021  2:45 PM

## 2021-04-24 NOTE — Progress Notes (Signed)
Careteam: Patient Care Team: Yvonna Alanis, NP as PCP - General (Adult Health Nurse Practitioner)  Advanced Directive information    Allergies  Allergen Reactions   Clonidine Anaphylaxis, Swelling and Other (See Comments)    Tongue swelling   Norvasc [Amlodipine Besylate] Swelling    Edema and TONGUE swelling    Lisinopril Cough   Morphine And Related    Amlodipine Anxiety, Nausea Only and Swelling   Codeine Itching and Rash    Chief Complaint  Patient presents with   Acute Visit    Patient needs referral for home health, physical therapy and occupational therapy. Orders were done a few months ago and they somehow got lost and having to redo them. Patient not transferring on her own.     HPI: Patient is a 75 y.o. female requesting home health.  Pt with hx of CVA s/p left sided weakness, htn, ckd stage 4, DM  She feel and broke her ankle 5 months ago.  Stroke and now s/p left sided weakness and now s/p ankle fracture.  She is wearing a big boot on right ankle.  She has instability of the right ankle.  Following with orthopedic- tried to get home health through them.  Daughter would like to get some outpatient PT as well but having such a hard time with transfers needing to get someone into the home to helps strengthen.  She is not having any pain at this time.   Review of Systems:  Review of Systems  Constitutional:  Negative for chills, fever and weight loss.  HENT:  Negative for tinnitus.   Respiratory:  Negative for cough, sputum production and shortness of breath.   Cardiovascular:  Negative for chest pain, palpitations and leg swelling.  Gastrointestinal:  Negative for abdominal pain, constipation, diarrhea and heartburn.  Genitourinary:  Negative for dysuria, frequency and urgency.  Musculoskeletal:  Negative for back pain, falls, joint pain and myalgias.  Skin: Negative.   Neurological:  Negative for dizziness and headaches.  Psychiatric/Behavioral:   Negative for depression and memory loss. The patient does not have insomnia.    Past Medical History:  Diagnosis Date   Allergy    Arthritis    Broken ankle    Cataract    Diabetes mellitus without complication ([place])    History of colonoscopy    Hypertension    Macular degeneration syndrome    with edema---being treated.    Stage 4 chronic kidney disease ([place])    Stroke ([place]) 2019   Past Surgical History:  Procedure Laterality Date   callous removal  Left 2017   located on 1st digit on L foot 2/2 diabetes   EYE SURGERY     Social History:   reports that she has never smoked. She has never used smokeless tobacco. She reports that she does not drink alcohol and does not use drugs.  Family History  Problem Relation Age of Onset   Kidney disease Mother    Congestive Heart Failure Father    COPD Father    COPD Brother    Diabetes Maternal Aunt     Medications: Patient's Medications  New Prescriptions   No medications on file  Previous Medications   ACETAMINOPHEN (RA ACETAMINOPHEN) 650 MG CR TABLET    Take 1 tablet (650 mg total) by mouth every 8 (eight) hours as needed for pain.   ASPIRIN EC 81 MG TABLET    Take 81 mg by mouth daily. Swallow whole.   CHOLECALCIFEROL (  VITAMIN D3) 25 MCG (1000 UNIT) TABLET    Take 1,000 Units by mouth 2 (two) times a week.   LOSARTAN (COZAAR) 25 MG TABLET    TAKE 1 TABLET BY MOUTH TWICE A DAY   ONETOUCH VERIO TEST STRIP       PROBIOTIC PRODUCT (PROBIOTIC DAILY PO)    Take by mouth.  Modified Medications   No medications on file  Discontinued Medications   No medications on file    Physical Exam:  There were no vitals filed for this visit. There is no height or weight on file to calculate BMI. Wt Readings from Last 3 Encounters:  10/19/20 161 lb (73 kg)  08/18/20 161 lb (73 kg)  07/26/20 164 lb 10.9 oz (74.7 kg)      Labs reviewed: Basic Metabolic Panel: Recent Labs    08/18/20 1153 10/19/20 1036 10/21/20 0127  10/22/20 0138 10/24/20 0526 01/18/21 1152  NA  --    < > 139 136 139 138  K  --    < > 3.9 3.7 3.9 4.4  CL  --    < > 110 109 109 104  CO2  --    < > 20* 20* 22 25  GLUCOSE  --    < > 115* 133* 123* 154*  BUN  --    < > 33* 28* 28* 32*  CREATININE  --    < > 2.19* 1.86* 1.90* 1.98*  CALCIUM  --    < > 8.7* 8.8* 8.8* 9.6  MG 2.3  --  1.9 1.8  --   --    < > = values in this interval not displayed.   Liver Function Tests: Recent Labs    08/18/20 1153 01/18/21 1152  AST 13 11  ALT 13 12  BILITOT 0.5 0.3  PROT 6.7 6.5   No results for input(s): LIPASE, AMYLASE in the last 8760 hours. No results for input(s): AMMONIA in the last 8760 hours. CBC: Recent Labs    08/18/20 1153 10/19/20 1036 10/21/20 0127 10/22/20 0138 01/18/21 1152  WBC 9.1   < > 8.6 10.2 8.6  NEUTROABS 6.1  --  5.5  --  5,814  HGB 14.0   < > 10.6* 11.2* 13.3  HCT 41.3   < > 32.1* 33.5* 40.6  MCV 85.9   < > 87.0 84.4 84.6  PLT 258.0   < > 241 269 334   < > = values in this interval not displayed.   Lipid Panel: Recent Labs    08/18/20 1153  CHOL 250*  HDL 38.10*  TRIG 351.0*  CHOLHDL 7  LDLDIRECT 149.0   TSH: No results for input(s): TSH in the last 8760 hours. A1C: Lab Results  Component Value Date   HGBA1C 7.0 (H) 01/18/2021     Assessment/Plan 1. Closed bimalleolar fracture of right ankle with malunion, subsequent encounter -weakness to right ankle after fracture, unable to transfer without assistance from family.  - Ambulatory referral to Home Health  2. Hemiplegia and hemiparesis following cerebral infarction affecting left non-dominant side (HCC) -weakness to left side and now with instability and weakness of right ankle. Needing more help for transfers. Needs to increase strength  - Ambulatory referral to Home Health   3. Unstable gait -home health PT/OT evaluation and treatment.   4. Instability of right ankle joint -home health PT/ot to evaluate and treat.    Next appt:  05/28/2021 For labs and follow up [place]. Harle Battiest  [place] [phone]    Virtual Visit via Deloris Ping  I connected with patient on 04/24/21 at  3:15 PM EDT by video visit  and verified that I am speaking with the correct person using two identifiers.  Location: Patient: home Provider: [facility]   I discussed the limitations, risks, security and privacy concerns of performing an evaluation and management service by telephone and the availability of in person appointments. I also discussed with the patient that there may be a patient responsible charge related to this service. The patient expressed understanding and agreed to proceed.   I discussed the assessment and treatment plan with the patient. The patient was provided an opportunity to ask questions and all were answered. The patient agreed with the plan and demonstrated an understanding of the instructions.   The patient was advised to call back or seek an in-person evaluation if the symptoms worsen or if the condition fails to improve as anticipated.  I provided 15 minutes of non-face-to-face time during this encounter.  Carlos American. Harle Battiest Avs printed and mailed

## 2021-04-25 DIAGNOSIS — M25571 Pain in right ankle and joints of right foot: Secondary | ICD-10-CM | POA: Diagnosis not present

## 2021-04-27 DIAGNOSIS — S82899A Other fracture of unspecified lower leg, initial encounter for closed fracture: Secondary | ICD-10-CM | POA: Diagnosis not present

## 2021-04-27 DIAGNOSIS — N184 Chronic kidney disease, stage 4 (severe): Secondary | ICD-10-CM | POA: Diagnosis not present

## 2021-04-27 DIAGNOSIS — S82841A Displaced bimalleolar fracture of right lower leg, initial encounter for closed fracture: Secondary | ICD-10-CM | POA: Diagnosis not present

## 2021-04-27 DIAGNOSIS — I639 Cerebral infarction, unspecified: Secondary | ICD-10-CM | POA: Diagnosis not present

## 2021-04-27 DIAGNOSIS — I69952 Hemiplegia and hemiparesis following unspecified cerebrovascular disease affecting left dominant side: Secondary | ICD-10-CM | POA: Diagnosis not present

## 2021-05-11 ENCOUNTER — Other Ambulatory Visit: Payer: Self-pay | Admitting: Orthopedic Surgery

## 2021-05-11 DIAGNOSIS — E1122 Type 2 diabetes mellitus with diabetic chronic kidney disease: Secondary | ICD-10-CM

## 2021-05-28 ENCOUNTER — Encounter: Payer: Self-pay | Admitting: Nurse Practitioner

## 2021-05-28 ENCOUNTER — Ambulatory Visit (INDEPENDENT_AMBULATORY_CARE_PROVIDER_SITE_OTHER): Payer: Medicare Other | Admitting: Nurse Practitioner

## 2021-05-28 ENCOUNTER — Other Ambulatory Visit: Payer: Self-pay

## 2021-05-28 VITALS — BP 146/80 | HR 80 | Temp 97.9°F

## 2021-05-28 DIAGNOSIS — E1122 Type 2 diabetes mellitus with diabetic chronic kidney disease: Secondary | ICD-10-CM | POA: Diagnosis not present

## 2021-05-28 DIAGNOSIS — M25371 Other instability, right ankle: Secondary | ICD-10-CM | POA: Diagnosis not present

## 2021-05-28 DIAGNOSIS — E1169 Type 2 diabetes mellitus with other specified complication: Secondary | ICD-10-CM

## 2021-05-28 DIAGNOSIS — S82841P Displaced bimalleolar fracture of right lower leg, subsequent encounter for closed fracture with malunion: Secondary | ICD-10-CM

## 2021-05-28 DIAGNOSIS — N1832 Chronic kidney disease, stage 3b: Secondary | ICD-10-CM | POA: Diagnosis not present

## 2021-05-28 DIAGNOSIS — E785 Hyperlipidemia, unspecified: Secondary | ICD-10-CM

## 2021-05-28 DIAGNOSIS — I69354 Hemiplegia and hemiparesis following cerebral infarction affecting left non-dominant side: Secondary | ICD-10-CM | POA: Diagnosis not present

## 2021-05-28 DIAGNOSIS — I639 Cerebral infarction, unspecified: Secondary | ICD-10-CM | POA: Diagnosis not present

## 2021-05-28 DIAGNOSIS — N184 Chronic kidney disease, stage 4 (severe): Secondary | ICD-10-CM

## 2021-05-28 DIAGNOSIS — S82841A Displaced bimalleolar fracture of right lower leg, initial encounter for closed fracture: Secondary | ICD-10-CM | POA: Diagnosis not present

## 2021-05-28 DIAGNOSIS — S82899A Other fracture of unspecified lower leg, initial encounter for closed fracture: Secondary | ICD-10-CM | POA: Diagnosis not present

## 2021-05-28 DIAGNOSIS — I69952 Hemiplegia and hemiparesis following unspecified cerebrovascular disease affecting left dominant side: Secondary | ICD-10-CM | POA: Diagnosis not present

## 2021-05-28 DIAGNOSIS — I10 Essential (primary) hypertension: Secondary | ICD-10-CM | POA: Diagnosis not present

## 2021-05-28 DIAGNOSIS — F419 Anxiety disorder, unspecified: Secondary | ICD-10-CM

## 2021-05-28 MED ORDER — SERTRALINE HCL 25 MG PO TABS: 25.0000 mg | ORAL_TABLET | Freq: Every day | ORAL | 1 refills | Status: DC

## 2021-05-28 NOTE — Patient Instructions (Signed)
Start zoloft 25 mg by mouth daily   Follow up in 6 weeks via virtual visit or in-person

## 2021-05-28 NOTE — Progress Notes (Signed)
Careteam: Patient Care Team: Octavia Heir, NP as PCP - General (Adult Health Nurse Practitioner)  PLACE OF SERVICE:  [facility]  Advanced Directive information    Allergies  Allergen Reactions   Clonidine Anaphylaxis, Swelling and Other (See Comments)    Tongue swelling   Norvasc [Amlodipine Besylate] Swelling    Edema and TONGUE swelling    Lisinopril Cough   Morphine And Related    Amlodipine Anxiety, Nausea Only and Swelling   Codeine Itching and Rash    Chief Complaint  Patient presents with   Medical Management of Chronic Issues    Routine follow-up, requesting blood work. Foot exam today. Discuss need for colonoscopy.      HPI: Patient is a 75 y.o. female for routine follow up.   She is wheelchair bound, continues to follow up with orthopedic. She did not feel good about surgery so opted against surgery.  Continues to work on getting strength up. Has not heard from PT yet.   Htn-blood pressure at home ~130-140/80. Low sodium diet.   DM- diet controlled.   Macular degeneration- follows with ophthalmologist every 3-4 months.   Decline bone density   Did not tolerate statin in the hospital. A lot of side effects.    Review of Systems:  Review of Systems  Constitutional:  Negative for chills, fever and weight loss.  HENT:  Negative for tinnitus.   Respiratory:  Negative for cough, sputum production and shortness of breath.   Cardiovascular:  Negative for chest pain, palpitations and leg swelling.  Gastrointestinal:  Negative for abdominal pain, constipation, diarrhea and heartburn.  Genitourinary:  Negative for dysuria, frequency and urgency.  Musculoskeletal:  Negative for back pain, falls, joint pain and myalgias.       Instability of ankle   Skin: Negative.   Neurological:  Negative for dizziness and headaches.  Psychiatric/Behavioral:  Negative for depression and memory loss. The patient is nervous/anxious. The patient does not have insomnia.     Past Medical History:  Diagnosis Date   Allergy    Arthritis    Broken ankle    Cataract    Diabetes mellitus without complication (HCC)    History of colonoscopy    Hypertension    Macular degeneration syndrome    with edema---being treated.    Stage 4 chronic kidney disease (HCC)    Stroke (HCC) 2019   Past Surgical History:  Procedure Laterality Date   callous removal  Left 2017   located on 1st digit on L foot 2/2 diabetes   EYE SURGERY     Social History:   reports that she has never smoked. She has never used smokeless tobacco. She reports that she does not drink alcohol and does not use drugs.  Family History  Problem Relation Age of Onset   Kidney disease Mother    Congestive Heart Failure Father    COPD Father    COPD Brother    Diabetes Maternal Aunt     Medications: Patient's Medications  New Prescriptions   No medications on file  Previous Medications   ACETAMINOPHEN (RA ACETAMINOPHEN) 650 MG CR TABLET    Take 1 tablet (650 mg total) by mouth every 8 (eight) hours as needed for pain.   AMBULATORY NON FORMULARY MEDICATION    Medication Name: CBD Oil daily   CHOLECALCIFEROL (VITAMIN D3) 25 MCG (1000 UNIT) TABLET    Take 1,000 Units by mouth 2 (two) times a week.   LOSARTAN (COZAAR)  25 MG TABLET    TAKE 1 TABLET BY MOUTH TWICE A DAY   ONETOUCH VERIO TEST STRIP    1 each 2 (two) times daily.   PROBIOTIC PRODUCT (PROBIOTIC DAILY PO)    Take by mouth.  Modified Medications   No medications on file  Discontinued Medications   ASPIRIN EC 81 MG TABLET    Take 81 mg by mouth daily. Swallow whole.    Physical Exam:  Vitals:   05/28/21 1512  BP: (!) 146/80  Pulse: 80  Temp: 97.9 F (36.6 C)  TempSrc: Temporal  SpO2: 96%   There is no height or weight on file to calculate BMI. Wt Readings from Last 3 Encounters:  10/19/20 161 lb (73 kg)  08/18/20 161 lb (73 kg)  07/26/20 164 lb 10.9 oz (74.7 kg)    Physical Exam Constitutional:      General: She  is not in acute distress.    Appearance: She is well-developed. She is not diaphoretic.  HENT:     Head: Normocephalic and atraumatic.     Mouth/Throat:     Pharynx: No oropharyngeal exudate.  Eyes:     Conjunctiva/sclera: Conjunctivae normal.     Pupils: Pupils are equal, round, and reactive to light.  Cardiovascular:     Rate and Rhythm: Normal rate and regular rhythm.     Heart sounds: Normal heart sounds.  Pulmonary:     Effort: Pulmonary effort is normal.     Breath sounds: Normal breath sounds.  Abdominal:     General: Bowel sounds are normal.     Palpations: Abdomen is soft.  Musculoskeletal:     Cervical back: Normal range of motion and neck supple.     Right lower leg: No edema.     Left lower leg: No edema.  Skin:    General: Skin is warm and dry.  Neurological:     Mental Status: She is alert.     Comments: Left sided hemiparesis   Psychiatric:        Mood and Affect: Mood normal.    Labs reviewed: Basic Metabolic Panel: Recent Labs    08/18/20 1153 10/19/20 1036 10/21/20 0127 10/22/20 0138 10/24/20 0526 01/18/21 1152  NA  --    < > 139 136 139 138  K  --    < > 3.9 3.7 3.9 4.4  CL  --    < > 110 109 109 104  CO2  --    < > 20* 20* 22 25  GLUCOSE  --    < > 115* 133* 123* 154*  BUN  --    < > 33* 28* 28* 32*  CREATININE  --    < > 2.19* 1.86* 1.90* 1.98*  CALCIUM  --    < > 8.7* 8.8* 8.8* 9.6  MG 2.3  --  1.9 1.8  --   --    < > = values in this interval not displayed.   Liver Function Tests: Recent Labs    08/18/20 1153 01/18/21 1152  AST 13 11  ALT 13 12  BILITOT 0.5 0.3  PROT 6.7 6.5   No results for input(s): LIPASE, AMYLASE in the last 8760 hours. No results for input(s): AMMONIA in the last 8760 hours. CBC: Recent Labs    08/18/20 1153 10/19/20 1036 10/21/20 0127 10/22/20 0138 01/18/21 1152  WBC 9.1   < > 8.6 10.2 8.6  NEUTROABS 6.1  --  5.5  --  5,814  HGB 14.0   < > 10.6* 11.2* 13.3  HCT 41.3   < > 32.1* 33.5* 40.6  MCV  85.9   < > 87.0 84.4 84.6  PLT 258.0   < > 241 269 334   < > = values in this interval not displayed.   Lipid Panel: Recent Labs    08/18/20 1153  CHOL 250*  HDL 38.10*  TRIG 351.0*  CHOLHDL 7  LDLDIRECT 149.0   TSH: No results for input(s): TSH in the last 8760 hours. A1C: Lab Results  Component Value Date   HGBA1C 7.0 (H) 01/18/2021     Assessment/Plan 1. Closed bimalleolar fracture of right ankle with malunion, subsequent encounter -continues to wear boot, followed by orthopedic and awaiting PT evaluation to help strength.   2. Hemiplegia and hemiparesis following cerebral infarction affecting left non-dominant side (HCC) -stable, educated on importance of proper blood pressure, cholesterol and glucose control.   3. Instability of right ankle joint -ongoing, PT pending.   4. Type 2 diabetes mellitus with stage 4 chronic kidney disease, without long-term current use of insulin ([place]) -Encouraged dietary compliance, routine foot care/monitoring and to keep up with diabetic eye exams through ophthalmology  - Hemoglobin A1c  5. Essential (primary) hypertension Improved on recheck to 138/82. Encouraged to monitor at home goal <140/90. Continue dietary modifications.  - CMP with eGFR([facility]) - CBC with Differential/Platelet  6. Hyperlipidemia associated with type 2 diabetes mellitus ([place]) Would benefit from statin. Had adverse reaction from statin in hospital after CVA. Daughter will let us know which medication this was.  Continue on dietary modifications.  - CMP with eGFR([facility]) - Lipid panel  7. Chronic kidney disease, stage 4 (HCC) Encourage proper hydration Follow metabolic panel Avoid nephrotoxic meds (NSAIDS) - Vitamin D, 25-hydroxy  8. Anxiety -worsening anxiety since she has been wheelchair bound -will start low dose zoloft, can increase to 50 mg if needed at follow up.  - sertraline (ZOLOFT) 25 MG tablet; Take 1 tablet (25 mg total) by mouth daily.   Dispense: 30 tablet; Refill: 1   Next appt: 6 weeks for mood.  Carlos American. [place], [facility] [phone]

## 2021-05-29 ENCOUNTER — Encounter: Payer: Self-pay | Admitting: Nurse Practitioner

## 2021-05-29 DIAGNOSIS — E1169 Type 2 diabetes mellitus with other specified complication: Secondary | ICD-10-CM

## 2021-05-29 DIAGNOSIS — E785 Hyperlipidemia, unspecified: Secondary | ICD-10-CM

## 2021-05-29 LAB — CBC WITH DIFFERENTIAL/PLATELET
Absolute Monocytes: 710 cells/uL (ref 200–950)
Basophils Absolute: 90 cells/uL (ref 0–200)
Basophils Relative: 0.9 %
Eosinophils Absolute: 420 cells/uL (ref 15–500)
Eosinophils Relative: 4.2 %
HCT: 42.3 % (ref 35.0–45.0)
Hemoglobin: 13.7 g/dL (ref 11.7–15.5)
Lymphs Abs: 1840 cells/uL (ref 850–3900)
MCH: 27.2 pg (ref 27.0–33.0)
MCHC: 32.4 g/dL (ref 32.0–36.0)
MCV: 84.1 fL (ref 80.0–100.0)
MPV: 11.4 fL (ref 7.5–12.5)
Monocytes Relative: 7.1 %
Neutro Abs: 6940 cells/uL (ref 1500–7800)
Neutrophils Relative %: 69.4 %
Platelets: 308 10*3/uL (ref 140–400)
RBC: 5.03 10*6/uL (ref 3.80–5.10)
RDW: 13.8 % (ref 11.0–15.0)
Total Lymphocyte: 18.4 %
WBC: 10 10*3/uL (ref 3.8–10.8)

## 2021-05-29 LAB — LIPID PANEL
Cholesterol: 255 mg/dL — ABNORMAL HIGH (ref <200)
HDL: 34 mg/dL — ABNORMAL LOW (ref >=50)
Non-HDL Cholesterol (Calc): 221 mg/dL (calc) — ABNORMAL HIGH (ref <130)
Total CHOL/HDL Ratio: 7.5 (calc) — ABNORMAL HIGH (ref <5.0)
Triglycerides: 510 mg/dL — ABNORMAL HIGH (ref <150)

## 2021-05-29 LAB — COMPLETE METABOLIC PANEL WITH GFR
AG Ratio: 1.4 (calc) (ref 1.0–2.5)
ALT: 11 U/L (ref 6–29)
AST: 10 U/L (ref 10–35)
Albumin: 3.8 g/dL (ref 3.6–5.1)
Alkaline phosphatase (APISO): 108 U/L (ref 37–153)
BUN/Creatinine Ratio: 20 (calc) (ref 6–22)
BUN: 37 mg/dL — ABNORMAL HIGH (ref 7–25)
CO2: 21 mmol/L (ref 20–32)
Calcium: 9.2 mg/dL (ref 8.6–10.4)
Chloride: 104 mmol/L (ref 98–110)
Creat: 1.82 mg/dL — ABNORMAL HIGH (ref 0.60–1.00)
Globulin: 2.8 g/dL (calc) (ref 1.9–3.7)
Glucose, Bld: 188 mg/dL — ABNORMAL HIGH (ref 65–139)
Potassium: 4.7 mmol/L (ref 3.5–5.3)
Sodium: 136 mmol/L (ref 135–146)
Total Bilirubin: 0.3 mg/dL (ref 0.2–1.2)
Total Protein: 6.6 g/dL (ref 6.1–8.1)
eGFR: 29 mL/min/{1.73_m2} — ABNORMAL LOW (ref >=60)

## 2021-05-29 LAB — HEMOGLOBIN A1C
Hgb A1c MFr Bld: 7.7 % of total Hgb — ABNORMAL HIGH (ref <5.7)
Mean Plasma Glucose: 174 mg/dL
eAG (mmol/L): 9.7 mmol/L

## 2021-05-29 LAB — VITAMIN D 25 HYDROXY (VIT D DEFICIENCY, FRACTURES): Vit D, 25-Hydroxy: 49 ng/mL (ref 30–100)

## 2021-05-29 MED ORDER — ROSUVASTATIN CALCIUM 5 MG PO TABS: ORAL_TABLET | ORAL | 3 refills | Status: DC

## 2021-06-18 ENCOUNTER — Telehealth: Payer: Self-pay | Admitting: Nurse Practitioner

## 2021-06-18 NOTE — Telephone Encounter (Signed)
Noted thank you for the update

## 2021-06-18 NOTE — Telephone Encounter (Signed)
Ruth(pt advocate rep) with [facility]) called to say that [facility] had availibilty for Ms Covell to provide PT/OT services. No 3051 form (PCS) needed to be faxed, just have clinical intake call in verbal order to approve. I was given an intake # [place] # [phone] & ref ID# [id number]  Rodena Piety [place] called to give verbal approval for [facility] services that [facility] had agreed to approve. 30-35 hrs weekly as long as needed. 06/14/21, Sunday Spillers with [facility] stated-no available resources & it was too far for [place] to travel for services.  3:30p 06/14/21, A call [employer] back to speak with Rod Holler & see where the communication error failed. No one could find Rod Holler, nor were there any notes documented on Ms Argo's acct stating we had spoke earlier that day.  Nor notes documented to the ref ID # [id number] provided. Then to be transferred back to provider services to leave a message.  06/18/21, [name], pts daughter called to get an update on the failed process. Family has decided it's in the best interest of pt to change insurance possibly to [facility] or [employer] & provide [place] with that info at her next appt with Janett Billow for 6 wk follow up appt on 07/12/21.  Sorry that this pt has waited close to 6 mths to receive [facility] services all bc [employer] has not provided & companies are refusing services to the pts truly need.  Thanks, Vilinda Blanks

## 2021-06-20 ENCOUNTER — Other Ambulatory Visit: Payer: Self-pay | Admitting: Nurse Practitioner

## 2021-06-20 DIAGNOSIS — F419 Anxiety disorder, unspecified: Secondary | ICD-10-CM

## 2021-06-27 DIAGNOSIS — S82841A Displaced bimalleolar fracture of right lower leg, initial encounter for closed fracture: Secondary | ICD-10-CM | POA: Diagnosis not present

## 2021-06-27 DIAGNOSIS — I639 Cerebral infarction, unspecified: Secondary | ICD-10-CM | POA: Diagnosis not present

## 2021-06-27 DIAGNOSIS — S82899A Other fracture of unspecified lower leg, initial encounter for closed fracture: Secondary | ICD-10-CM | POA: Diagnosis not present

## 2021-06-27 DIAGNOSIS — N184 Chronic kidney disease, stage 4 (severe): Secondary | ICD-10-CM | POA: Diagnosis not present

## 2021-06-27 DIAGNOSIS — I69952 Hemiplegia and hemiparesis following unspecified cerebrovascular disease affecting left dominant side: Secondary | ICD-10-CM | POA: Diagnosis not present

## 2021-07-12 ENCOUNTER — Telehealth (INDEPENDENT_AMBULATORY_CARE_PROVIDER_SITE_OTHER): Payer: Medicare PPO | Admitting: Nurse Practitioner

## 2021-07-12 ENCOUNTER — Other Ambulatory Visit: Payer: Self-pay

## 2021-07-12 DIAGNOSIS — I69354 Hemiplegia and hemiparesis following cerebral infarction affecting left non-dominant side: Secondary | ICD-10-CM

## 2021-07-12 DIAGNOSIS — E1169 Type 2 diabetes mellitus with other specified complication: Secondary | ICD-10-CM | POA: Diagnosis not present

## 2021-07-12 DIAGNOSIS — M25371 Other instability, right ankle: Secondary | ICD-10-CM | POA: Diagnosis not present

## 2021-07-12 DIAGNOSIS — E785 Hyperlipidemia, unspecified: Secondary | ICD-10-CM

## 2021-07-12 DIAGNOSIS — N184 Chronic kidney disease, stage 4 (severe): Secondary | ICD-10-CM

## 2021-07-12 DIAGNOSIS — E1122 Type 2 diabetes mellitus with diabetic chronic kidney disease: Secondary | ICD-10-CM

## 2021-07-12 DIAGNOSIS — S82841P Displaced bimalleolar fracture of right lower leg, subsequent encounter for closed fracture with malunion: Secondary | ICD-10-CM | POA: Diagnosis not present

## 2021-07-12 DIAGNOSIS — I10 Essential (primary) hypertension: Secondary | ICD-10-CM

## 2021-07-12 MED ORDER — FENOFIBRATE 145 MG PO TABS: 145.0000 mg | ORAL_TABLET | Freq: Every day | ORAL | 1 refills | Status: DC

## 2021-07-12 NOTE — Progress Notes (Signed)
This service is provided via telemedicine  No vital signs collected/recorded due to the encounter was a telemedicine visit.   Location of patient (ex: home, work):  Home  Patient consents to a telephone visit:  Yes, see encounter dated 04/24/2021  Location of the provider (ex: office, home):  [facility]  Name of any referring provider:  N/A  Names of all persons participating in the telemedicine service and their role in the encounter:  Sherrie Mustache, Nurse Practitioner, Carroll Kinds, CMA, patient and daughter,[name]  Time spent on call:  7 minutes with medical assistant

## 2021-07-12 NOTE — Progress Notes (Signed)
Careteam: Patient Care Team: Lauree Chandler, NP as PCP - General (Geriatric Medicine)  Advanced Directive information    Allergies  Allergen Reactions   Clonidine Anaphylaxis, Swelling and Other (See Comments)    Tongue swelling   Norvasc [Amlodipine Besylate] Swelling    Edema and TONGUE swelling    Lisinopril Cough   Morphine And Related    Amlodipine Anxiety, Nausea Only and Swelling   Codeine Itching and Rash    Chief Complaint  Patient presents with   Follow-up    Follow up on mood and occupational and physical therapy. Patient states that mood has been very good. Anxiety has gotten a lot better.Medication is helping. Still waiting on both occupational and physical therapy to come in.      HPI: Patient is a 76 y.o. female for follow up on mood.   Reports her mood is great and ready for PT due to hx of bimalleolar fracture of right ankle and with the hx of CVA she has not been able to move a lot.  Daughter reports recent change in insurance and needs new order for PT/OT. Not a lot of coverage with her past insurance.  She requires assistance for transfers. Using wheelchair.   Hyperlipidemia- Could not tolerate the crestor- she was very flushed/hot flashes and red in the face. She did not tolerate atorvastatin as well. Her daughter has a lot changed diet.   138/77 was blood pressure at home.  Pulse ox  97 HR 77 Review of Systems:  Review of Systems  Constitutional:  Negative for chills, fever and weight loss.  HENT:  Negative for tinnitus.   Respiratory:  Negative for cough, sputum production and shortness of breath.   Cardiovascular:  Negative for chest pain, palpitations and leg swelling.  Gastrointestinal:  Negative for abdominal pain, constipation, diarrhea and heartburn.  Genitourinary:  Negative for dysuria, frequency and urgency.  Musculoskeletal:  Positive for falls and joint pain. Negative for back pain and myalgias.  Skin: Negative.    Neurological:  Positive for weakness. Negative for dizziness and headaches.  Psychiatric/Behavioral:  Negative for depression and memory loss. The patient is nervous/anxious. The patient does not have insomnia.    Past Medical History:  Diagnosis Date   Allergy    Arthritis    Broken ankle    Cataract    Diabetes mellitus without complication ([place])    History of colonoscopy    Hypertension    Macular degeneration syndrome    with edema---being treated.    Stage 4 chronic kidney disease ([place])    Stroke ([place]) 2019   Past Surgical History:  Procedure Laterality Date   callous removal  Left 2017   located on 1st digit on L foot 2/2 diabetes   EYE SURGERY     Social History:   reports that she has never smoked. She has never used smokeless tobacco. She reports that she does not drink alcohol and does not use drugs.  Family History  Problem Relation Age of Onset   Kidney disease Mother    Congestive Heart Failure Father    COPD Father    COPD Brother    Diabetes Maternal Aunt     Medications: Patient's Medications  New Prescriptions   No medications on file  Previous Medications   ACETAMINOPHEN (RA ACETAMINOPHEN) 650 MG CR TABLET    Take 1 tablet (650 mg total) by mouth every 8 (eight) hours as needed for pain.   AMBULATORY NON FORMULARY  MEDICATION    Medication Name: CBD Oil daily   CHOLECALCIFEROL (VITAMIN D3) 25 MCG (1000 UNIT) TABLET    Take 1,000 Units by mouth 2 (two) times a week.   LOSARTAN (COZAAR) 25 MG TABLET    TAKE 1 TABLET BY MOUTH TWICE A DAY   ONETOUCH VERIO TEST STRIP    1 each 2 (two) times daily.   PROBIOTIC PRODUCT (PROBIOTIC DAILY PO)    Take by mouth.   SERTRALINE (ZOLOFT) 25 MG TABLET    Take 1 tablet (25 mg total) by mouth daily. Diagnosis Association: Anxiety (F41.9)  Modified Medications   No medications on file  Discontinued Medications   ROSUVASTATIN (CRESTOR) 5 MG TABLET    1 tablet by mouth three times weekly on Monday, Wednesday and Friday  in the evening    Physical Exam:  There were no vitals filed for this visit. There is no height or weight on file to calculate BMI. Wt Readings from Last 3 Encounters:  10/19/20 161 lb (73 kg)  08/18/20 161 lb (73 kg)  07/26/20 164 lb 10.9 oz (74.7 kg)    Physical Exam Pulmonary:     Effort: Pulmonary effort is normal.  Neurological:     Mental Status: She is alert. Mental status is at baseline.  Psychiatric:        Mood and Affect: Mood normal.    Labs reviewed: Basic Metabolic Panel: Recent Labs    08/18/20 1153 10/19/20 1036 10/21/20 0127 10/22/20 0138 10/24/20 0526 01/18/21 1152 05/28/21 1554  NA  --    < > 139 136 139 138 136  K  --    < > 3.9 3.7 3.9 4.4 4.7  CL  --    < > 110 109 109 104 104  CO2  --    < > 20* 20* 22 25 21   GLUCOSE  --    < > 115* 133* 123* 154* 188*  BUN  --    < > 33* 28* 28* 32* 37*  CREATININE  --    < > 2.19* 1.86* 1.90* 1.98* 1.82*  CALCIUM  --    < > 8.7* 8.8* 8.8* 9.6 9.2  MG 2.3  --  1.9 1.8  --   --   --    < > = values in this interval not displayed.   Liver Function Tests: Recent Labs    08/18/20 1153 01/18/21 1152 05/28/21 1554  AST 13 11 10   ALT 13 12 11   BILITOT 0.5 0.3 0.3  PROT 6.7 6.5 6.6   No results for input(s): LIPASE, AMYLASE in the last 8760 hours. No results for input(s): AMMONIA in the last 8760 hours. CBC: Recent Labs    10/21/20 0127 10/22/20 0138 01/18/21 1152 05/28/21 1554  WBC 8.6 10.2 8.6 10.0  NEUTROABS 5.5  --  5,814 6,940  HGB 10.6* 11.2* 13.3 13.7  HCT 32.1* 33.5* 40.6 42.3  MCV 87.0 84.4 84.6 84.1  PLT 241 269 334 308   Lipid Panel: Recent Labs    08/18/20 1153 05/28/21 1554  CHOL 250* 255*  HDL 38.10* 34*  TRIG 351.0* 510*  CHOLHDL 7 7.5*  LDLDIRECT 149.0  --    TSH: No results for input(s): TSH in the last 8760 hours. A1C: Lab Results  Component Value Date   HGBA1C 7.7 (H) 05/28/2021     Assessment/Plan 1. Hyperlipidemia associated with type 2 diabetes mellitus  ([place]) -unable to tolerate crestor and lipitor in the past.  -triglycerides  elevated on last labs.  -will start fenofibrate at this time. Also continue dietary modifications and plans to come in for fasting blood work on next follow up. Goal LDL <70, consider rapatha if LDL not at goal on next labs.   - fenofibrate (TRICOR) 145 MG tablet; Take 1 tablet (145 mg total) by mouth daily.  Dispense: 30 tablet; Refill: 1  2. Closed bimalleolar fracture of right ankle with malunion, subsequent encounter - Ambulatory referral to [facility]  3. Instability of right ankle joint - Ambulatory referral to Home Health  4. Hemiplegia and hemiparesis following cerebral infarction affecting left non-dominant side ([place]) -discussed importance of proper blood pressure, blood sugar and cholesterol control to prevent future events.  - Ambulatory referral to Home Health  5. Essential (primary) hypertension --stable. Goal bp <140/90. Continue on current regimen with low sodium diet.   6. Type 2 diabetes mellitus with stage 4 chronic kidney disease, without long-term current use of insulin ([place]) -has worked on dietary modifications to bring down A1c -will follow up at next OV   Next appt: 08/24/2021 Janett Billow K. Harle Battiest  [facility] [phone]    Virtual Visit via mychart video  I connected with patient on 07/12/21 at  1:30 PM EST by video and verified that I am speaking with the correct person using two identifiers.  Location: Patient: home Provider: [place]   I discussed the limitations, risks, security and privacy concerns of performing an evaluation and management service by telephone and the availability of in person appointments. I also discussed with the patient that there may be a patient responsible charge related to this service. The patient expressed understanding and agreed to proceed.   I discussed the assessment and treatment plan with the patient. The patient  was provided an opportunity to ask questions and all were answered. The patient agreed with the plan and demonstrated an understanding of the instructions.   The patient was advised to call back or seek an in-person evaluation if the symptoms worsen or if the condition fails to improve as anticipated.  I provided 20 minutes of non-face-to-face time during this encounter.  Carlos [employer]. Harle Battiest Avs printed and mailed

## 2021-07-16 ENCOUNTER — Encounter: Payer: Self-pay | Admitting: Nurse Practitioner

## 2021-07-16 DIAGNOSIS — M25371 Other instability, right ankle: Secondary | ICD-10-CM

## 2021-07-16 DIAGNOSIS — I69354 Hemiplegia and hemiparesis following cerebral infarction affecting left non-dominant side: Secondary | ICD-10-CM

## 2021-07-16 DIAGNOSIS — S82841P Displaced bimalleolar fracture of right lower leg, subsequent encounter for closed fracture with malunion: Secondary | ICD-10-CM

## 2021-07-19 DIAGNOSIS — D509 Iron deficiency anemia, unspecified: Secondary | ICD-10-CM

## 2021-07-19 DIAGNOSIS — S82841P Displaced bimalleolar fracture of right lower leg, subsequent encounter for closed fracture with malunion: Secondary | ICD-10-CM

## 2021-07-19 DIAGNOSIS — H3530 Unspecified macular degeneration: Secondary | ICD-10-CM

## 2021-07-19 DIAGNOSIS — E1136 Type 2 diabetes mellitus with diabetic cataract: Secondary | ICD-10-CM

## 2021-07-19 DIAGNOSIS — E1122 Type 2 diabetes mellitus with diabetic chronic kidney disease: Secondary | ICD-10-CM

## 2021-07-19 DIAGNOSIS — N184 Chronic kidney disease, stage 4 (severe): Secondary | ICD-10-CM

## 2021-07-19 DIAGNOSIS — M1990 Unspecified osteoarthritis, unspecified site: Secondary | ICD-10-CM

## 2021-07-19 DIAGNOSIS — I129 Hypertensive chronic kidney disease with stage 1 through stage 4 chronic kidney disease, or unspecified chronic kidney disease: Secondary | ICD-10-CM

## 2021-07-20 ENCOUNTER — Telehealth: Payer: Self-pay

## 2021-07-20 NOTE — Telephone Encounter (Signed)
Voicemail message was forwarded from Kathyrn Lass, [facility] referral Coordinator, Elta Guadeloupe Bias with [facility] PT requested verbal orders for PT 1 time a week for 1 week, 2 times a week for 4 weeks then 1 time a week for 4 weeks starting 07/19/2021.  Verbal orders given

## 2021-07-26 ENCOUNTER — Encounter: Payer: Self-pay | Admitting: Podiatry

## 2021-07-26 ENCOUNTER — Other Ambulatory Visit: Payer: Self-pay

## 2021-07-26 ENCOUNTER — Other Ambulatory Visit: Payer: Self-pay | Admitting: Nurse Practitioner

## 2021-07-26 ENCOUNTER — Ambulatory Visit (INDEPENDENT_AMBULATORY_CARE_PROVIDER_SITE_OTHER): Payer: Medicare PPO | Admitting: Podiatry

## 2021-07-26 ENCOUNTER — Telehealth: Payer: Self-pay

## 2021-07-26 DIAGNOSIS — B351 Tinea unguium: Secondary | ICD-10-CM

## 2021-07-26 DIAGNOSIS — E1169 Type 2 diabetes mellitus with other specified complication: Secondary | ICD-10-CM

## 2021-07-26 DIAGNOSIS — Z794 Long term (current) use of insulin: Secondary | ICD-10-CM

## 2021-07-26 DIAGNOSIS — E0822 Diabetes mellitus due to underlying condition with diabetic chronic kidney disease: Secondary | ICD-10-CM | POA: Diagnosis not present

## 2021-07-26 DIAGNOSIS — L84 Corns and callosities: Secondary | ICD-10-CM

## 2021-07-26 DIAGNOSIS — N184 Chronic kidney disease, stage 4 (severe): Secondary | ICD-10-CM | POA: Diagnosis not present

## 2021-07-26 DIAGNOSIS — E785 Hyperlipidemia, unspecified: Secondary | ICD-10-CM

## 2021-07-26 NOTE — Telephone Encounter (Signed)
Okay to approve OT orders

## 2021-07-26 NOTE — Telephone Encounter (Signed)
Mora from [facility] calling to alert [place] of high BP of 155/95   Also, requesting  verbal orders for OT for 1x/week for one week then 2x/week for 7 weeks.    To Joelene Millin, NP

## 2021-07-26 NOTE — Telephone Encounter (Signed)
[employer]. No answer. Mailbox full, unable to leave message.

## 2021-07-27 NOTE — Telephone Encounter (Signed)
[employer]. Verbal order given.

## 2021-07-31 NOTE — Progress Notes (Signed)
°  Subjective:  Patient ID: [name], female    DOB: 02/02/1946,  MRN: [id number]  Chief Complaint  Patient presents with   Nail Problem    Trim nails    76 y.o. female presents for follow-up. Denies new pedal issues. Decided against surgical therapy for her ankle. Here for care of her nails and a callus to her heel. Working with PT to increase her mobility. Denies other pedal issues. Objective:  Physical Exam: warm, good capillary refill, nail exam onychomycosis of the toenails, no trophic changes or ulcerative lesions, normal DP and PT pulses, and normal sensory exam.  Left Foot: ankle mild varus deformity. No edema HPK plantar heel left  Assessment:   1. Diabetes mellitus due to underlying condition with stage 4 chronic kidney disease, with long-term current use of insulin ([place])   2. Onychomycosis   3. Callus    Plan:  Patient was evaluated and treated and all questions answered.  Left ankle fracture -Decided against operative management, continue PT  Onychomycosis -Patient is diabetic with a qualifying condition for at risk foot care.  Procedure: Nail Debridement Type of Debridement: manual, sharp debridement. Instrumentation: Nail nipper, rotary burr. Number of Nails: 10  Procedure: Paring of Lesion Rationale: painful hyperkeratotic lesion Type of Debridement: manual, sharp debridement. Instrumentation: 312 blade Number of Lesions: 1   No follow-ups on file.

## 2021-08-17 ENCOUNTER — Telehealth: Payer: Self-pay | Admitting: *Deleted

## 2021-08-17 NOTE — Telephone Encounter (Signed)
Mark with [facility] called requesting verbal orders to extend PT 2x6weeks.  Verbal orders given.

## 2021-08-24 ENCOUNTER — Encounter: Payer: Medicare Other | Admitting: Nurse Practitioner

## 2021-08-28 ENCOUNTER — Ambulatory Visit (INDEPENDENT_AMBULATORY_CARE_PROVIDER_SITE_OTHER): Payer: Medicare PPO | Admitting: Nurse Practitioner

## 2021-08-28 ENCOUNTER — Encounter: Payer: Self-pay | Admitting: Nurse Practitioner

## 2021-08-28 ENCOUNTER — Other Ambulatory Visit: Payer: Self-pay

## 2021-08-28 DIAGNOSIS — Z0000 Encounter for general adult medical examination without abnormal findings: Secondary | ICD-10-CM | POA: Diagnosis not present

## 2021-08-28 NOTE — Patient Instructions (Signed)
[name] , Thank you for taking time to come for your Medicare Wellness Visit. I appreciate your ongoing commitment to your health goals. Please review the following plan we discussed and let me know if I can assist you in the future.   Screening recommendations/referrals: Colonoscopy aged out Mammogram aged out  Bone Density recommended Recommended yearly ophthalmology/optometry visit for glaucoma screening and checkup Recommended yearly dental visit for hygiene and checkup  Vaccinations: Influenza vaccine you have declined vaccines at this time  Pneumococcal vaccine you have declined vaccines at this time Tdap vaccine you have declined vaccines at this time Shingles vaccine you have declined vaccines at this time    Advanced directives: please bring copy to office   Conditions/risks identified: advance age, diabetes, fall risk  Next appointment: yearly    Preventive Care 28 Years and Older, Female Preventive care refers to lifestyle choices and visits with your health care provider that can promote health and wellness. What does preventive care include? A yearly physical exam. This is also called an annual well check. Dental exams once or twice a year. Routine eye exams. Ask your health care provider how often you should have your eyes checked. Personal lifestyle choices, including: Daily care of your teeth and gums. Regular physical activity. Eating a healthy diet. Avoiding tobacco and drug use. Limiting alcohol use. Practicing safe sex. Taking low-dose aspirin every day. Taking vitamin and mineral supplements as recommended by your health care provider. What happens during an annual well check? The services and screenings done by your health care provider during your annual well check will depend on your age, overall health, lifestyle risk factors, and family history of disease. Counseling  Your health care provider may ask you questions about your: Alcohol  use. Tobacco use. Drug use. Emotional well-being. Home and relationship well-being. Sexual activity. Eating habits. History of falls. Memory and ability to understand (cognition). Work and work Statistician. Reproductive health. Screening  You may have the following tests or measurements: Height, weight, and BMI. Blood pressure. Lipid and cholesterol levels. These may be checked every 5 years, or more frequently if you are over 32 years old. Skin check. Lung cancer screening. You may have this screening every year starting at age 47 if you have a 30-pack-year history of smoking and currently smoke or have quit within the past 15 years. Fecal occult blood test (FOBT) of the stool. You may have this test every year starting at age 23. Flexible sigmoidoscopy or colonoscopy. You may have a sigmoidoscopy every 5 years or a colonoscopy every 10 years starting at age 82. Hepatitis C blood test. Hepatitis B blood test. Sexually transmitted disease (STD) testing. Diabetes screening. This is done by checking your blood sugar (glucose) after you have not eaten for a while (fasting). You may have this done every 1-3 years. Bone density scan. This is done to screen for osteoporosis. You may have this done starting at age 27. Mammogram. This may be done every 1-2 years. Talk to your health care provider about how often you should have regular mammograms. Talk with your health care provider about your test results, treatment options, and if necessary, the need for more tests. Vaccines  Your health care provider may recommend certain vaccines, such as: Influenza vaccine. This is recommended every year. Tetanus, diphtheria, and acellular pertussis (Tdap, Td) vaccine. You may need a Td booster every 10 years. Zoster vaccine. You may need this after age 13. Pneumococcal 13-valent conjugate (PCV13) vaccine. One dose is  recommended after age 62. Pneumococcal polysaccharide (PPSV23) vaccine. One dose is  recommended after age 20. Talk to your health care provider about which screenings and vaccines you need and how often you need them. This information is not intended to replace advice given to you by your health care provider. Make sure you discuss any questions you have with your health care provider. Document Released: 07/21/2015 Document Revised: 03/13/2016 Document Reviewed: 04/25/2015 Elsevier Interactive Patient Education  2017 [place] Prevention in the Home Falls can cause injuries. They can happen to people of all ages. There are many things you can do to make your home safe and to help prevent falls. What can I do on the outside of my home? Regularly fix the edges of walkways and driveways and fix any cracks. Remove anything that might make you trip as you walk through a door, such as a raised step or threshold. Trim any bushes or trees on the path to your home. Use bright outdoor lighting. Clear any walking paths of anything that might make someone trip, such as rocks or tools. Regularly check to see if handrails are loose or broken. Make sure that both sides of any steps have handrails. Any raised decks and porches should have guardrails on the edges. Have any leaves, snow, or ice cleared regularly. Use sand or salt on walking paths during winter. Clean up any spills in your garage right away. This includes oil or grease spills. What can I do in the bathroom? Use night lights. Install grab bars by the toilet and in the tub and shower. Do not use towel bars as grab bars. Use non-skid mats or decals in the tub or shower. If you need to sit down in the shower, use a plastic, non-slip stool. Keep the floor dry. Clean up any water that spills on the floor as soon as it happens. Remove soap buildup in the tub or shower regularly. Attach bath mats securely with double-sided non-slip rug tape. Do not have throw rugs and other things on the floor that can make you  trip. What can I do in the bedroom? Use night lights. Make sure that you have a light by your bed that is easy to reach. Do not use any sheets or blankets that are too big for your bed. They should not hang down onto the floor. Have a firm chair that has side arms. You can use this for support while you get dressed. Do not have throw rugs and other things on the floor that can make you trip. What can I do in the kitchen? Clean up any spills right away. Avoid walking on wet floors. Keep items that you use a lot in easy-to-reach places. If you need to reach something above you, use a strong step stool that has a grab bar. Keep electrical cords out of the way. Do not use floor polish or wax that makes floors slippery. If you must use wax, use non-skid floor wax. Do not have throw rugs and other things on the floor that can make you trip. What can I do with my stairs? Do not leave any items on the stairs. Make sure that there are handrails on both sides of the stairs and use them. Fix handrails that are broken or loose. Make sure that handrails are as long as the stairways. Check any carpeting to make sure that it is firmly attached to the stairs. Fix any carpet that is loose or worn. Avoid having  throw rugs at the top or bottom of the stairs. If you do have throw rugs, attach them to the floor with carpet tape. Make sure that you have a light switch at the top of the stairs and the bottom of the stairs. If you do not have them, ask someone to add them for you. What else can I do to help prevent falls? Wear shoes that: Do not have high heels. Have rubber bottoms. Are comfortable and fit you well. Are closed at the toe. Do not wear sandals. If you use a stepladder: Make sure that it is fully opened. Do not climb a closed stepladder. Make sure that both sides of the stepladder are locked into place. Ask someone to hold it for you, if possible. Clearly mark and make sure that you can  see: Any grab bars or handrails. First and last steps. Where the edge of each step is. Use tools that help you move around (mobility aids) if they are needed. These include: Canes. Walkers. Scooters. Crutches. Turn on the lights when you go into a dark area. Replace any light bulbs as soon as they burn out. Set up your furniture so you have a clear path. Avoid moving your furniture around. If any of your floors are uneven, fix them. If there are any pets around you, be aware of where they are. Review your medicines with your doctor. Some medicines can make you feel dizzy. This can increase your chance of falling. Ask your doctor what other things that you can do to help prevent falls. This information is not intended to replace advice given to you by your health care provider. Make sure you discuss any questions you have with your health care provider. Document Released: 04/20/2009 Document Revised: 11/30/2015 Document Reviewed: 07/29/2014 Elsevier Interactive Patient Education  2017 [employer].

## 2021-08-28 NOTE — Progress Notes (Signed)
This service is provided via telemedicine  No vital signs collected/recorded due to the encounter was a telemedicine visit.   Location of patient (ex: home, work):  Home  Patient consents to a telephone visit:  Yes, see encounter dated 04/24/2021  Location of the provider (ex: office, home):  [place]  Name of any referring provider:  N/A  Names of all persons participating in the telemedicine service and their role in the encounter:  Sherrie Mustache, Nurse Practitioner, Carroll Kinds, CMA, and patient.   Time spent on call:  13 minutes with medical assistant

## 2021-08-28 NOTE — Progress Notes (Signed)
Subjective:   MARLISA CARIDI is a 76 y.o. female who presents for Medicare Annual (Subsequent) preventive examination.  Review of Systems     Cardiac Risk Factors include: advanced age (>77men, >46 women);hypertension;sedentary lifestyle;diabetes mellitus     Objective:    There were no vitals filed for this visit. There is no height or weight on file to calculate BMI.  Advanced Directives 08/28/2021 10/19/2020 04/07/2019 02/24/2019 12/17/2018 09/21/2018 09/21/2018  Does Patient Have a Medical Advance Directive? Yes Yes Yes Yes Yes Yes Yes  Type of Paramedic of [place];Living will;Out of facility DNR (pink MOST or yellow form) Healthcare Power of [place] of [place] of [place] of [place];Living will [place]  Does patient want to make changes to medical advance directive? No - Patient declined Yes (Inpatient - patient defers changing a medical advance directive and declines information at this time) - - - No - Patient declined -  Copy of [place] in Chart? No - copy requested No - copy requested No - copy requested No - copy requested - No - copy requested -    Current Medications (verified) Outpatient Encounter Medications as of 08/28/2021  Medication Sig   acetaminophen (RA ACETAMINOPHEN) 650 MG CR tablet Take 1 tablet (650 mg total) by mouth every 8 (eight) hours as needed for pain.   AMBULATORY NON FORMULARY MEDICATION Medication Name: CBD Oil daily   cholecalciferol (VITAMIN D3) 25 MCG (1000 UNIT) tablet Take 1,000 Units by mouth 2 (two) times a week. (Patient not taking: Reported on 07/12/2021)   fenofibrate (TRICOR) 145 MG tablet Take 1 tablet (145 mg total) by mouth daily.   losartan (COZAAR) 25 MG tablet TAKE 1 TABLET BY MOUTH TWICE A DAY   ONETOUCH VERIO test strip 1 each 2 (two) times daily.   Probiotic Product (PROBIOTIC DAILY PO)  Take by mouth.   sertraline (ZOLOFT) 25 MG tablet Take 1 tablet (25 mg total) by mouth daily. Diagnosis Association: Anxiety (F41.9)   No facility-administered encounter medications on file as of 08/28/2021.    Allergies (verified) Clonidine, Norvasc [amlodipine besylate], Lisinopril, Morphine and related, Amlodipine, Codeine, and Crestor [rosuvastatin]   History: Past Medical History:  Diagnosis Date   Allergy    Arthritis    Broken ankle    Cataract    Diabetes mellitus without complication ([place])    History of colonoscopy    Hypertension    Macular degeneration syndrome    with edema---being treated.    Stage 4 chronic kidney disease ([place])    Stroke ([place]) 2019   Past Surgical History:  Procedure Laterality Date   callous removal  Left 2017   located on 1st digit on L foot 2/2 diabetes   EYE SURGERY     Family History  Problem Relation Age of Onset   Kidney disease Mother    Congestive Heart Failure Father    COPD Father    COPD Brother    Diabetes Maternal Aunt    Social History   Socioeconomic History   Marital status: Legally Separated    Spouse name: Rennis Harding   Number of children: 2   Years of education: Highschool and 1 year of collwege in business    Highest education level: High school graduate  Occupational History   Not on file  Tobacco Use   Smoking status: Never   Smokeless tobacco: Never  Vaping Use  Vaping Use: Never used  Substance and Sexual Activity   Alcohol use: No   Drug use: Never   Sexual activity: Not Currently  Other Topics Concern   Not on file  Social History Narrative   Tobacco use, amount per day now: None/Never Smoked   Past tobacco use, amount per day:   How many years did you use tobacco:   Alcohol use (drinks per week): Never    Diet: Carb modified.   Do you drink/eat things with caffeine: yes sometimes.   Marital status:   Single                               What year were you married?   Do you live in a house,  apartment, assisted living, condo, trailer, etc.? House   Is it one or more stories? One   How many persons live in your home? Daughter   Do you have pets in your home?( please list) None.   Highest Level of education completed? Associates Degree   Current or past profession: [employer]    Do you exercise?   No                               Type and how often?   Do you have a living will? Yes   Do you have a DNR form?     No                              If not, do you want to discuss one?   Do you have signed POA/HPOA forms?  Yes                   If so, please bring to you appointment      Do you have any difficulty bathing or dressing yourself? Yes   Do you have any difficulty preparing food or eating? No   Do you have any difficulty managing your medications? No   Do you have any difficulty managing your finances? No   Do you have any difficulty affording your medications? No   Social Determinants of Radio broadcast assistant Strain: Not on file  Food Insecurity: Not on file  Transportation Needs: Not on file  Physical Activity: Not on file  Stress: Not on file  Social Connections: Not on file    Tobacco Counseling Counseling given: Not Answered   Clinical Intake:  Pre-visit preparation completed: Yes  Pain : No/denies pain     BMI - recorded: 29 Nutritional Status: BMI 25 -29 Overweight Diabetes: Yes  How often do you need to have someone help you when you read instructions, pamphlets, or other written materials from your doctor or pharmacy?: 1 - Never  Diabetic?yes         Activities of Daily Living In your present state of health, do you have any difficulty performing the following activities: 08/28/2021 01/18/2021  Hearing? N N  Vision? N N  Comment - wear glasses  Difficulty concentrating or making decisions? N N  Walking or climbing stairs? Y Y  Comment - due to left side weakness and fracture ankle  Dressing or bathing? Y Y  Doing  errands, shopping? Tempie Donning  Preparing Food and eating ? Y -  Comment no trouble eating -  Using the Toilet? Y -  In the past six months, have you accidently leaked urine? N -  Do you have problems with loss of bowel control? N -  Managing your Medications? N -  Managing your Finances? N -  Housekeeping or managing your Housekeeping? N -  Some recent data might be hidden    Patient Care Team: Lauree Chandler, NP as PCP - General (Geriatric Medicine)  Indicate any recent Medical Services you may have received from other than [facility] providers in the past year (date may be approximate).     Assessment:   This is a routine wellness examination for Miana.  Hearing/Vision screen Hearing Screening - Comments:: Patient has no hearing problems Vision Screening - Comments:: Patient has no vision problems. Patient had eye exam last year. Patient sees Dr. Tye Savoy  Dietary issues and exercise activities discussed: Current Exercise Habits: Home exercise routine, Type of exercise: calisthenics, Time (Minutes): 30, Frequency (Times/Week): 4, Weekly Exercise (Minutes/Week): 120   Goals Addressed   None   Depression Screen PHQ 2/9 Scores 08/28/2021 05/28/2021 12/21/2019 12/31/2018 12/15/2018 02/18/2018 09/02/2017  PHQ - 2 Score 0 0 0 0 0 2 0  PHQ- 9 Score - - 3 - - 6 -    Fall Risk Fall Risk  08/28/2021 07/12/2021 05/28/2021 01/18/2021 08/18/2020  Falls in the past year? 0 0 0 1 0  Number falls in past yr: 0 0 0 0 -  Injury with Fall? 0 0 0 1 -  Risk for fall due to : No Fall Risks No Fall Risks No Fall Risks History of fall(s) -  Follow up Falls evaluation completed Falls evaluation completed Falls evaluation completed Falls evaluation completed;Education provided;Falls prevention discussed -    FALL RISK PREVENTION PERTAINING TO THE HOME:  Any stairs in or around the home? Yes  If so, are there any without handrails? No  Home free of loose throw rugs in walkways, pet beds, electrical cords, etc?  Yes  Adequate lighting in your home to reduce risk of falls? Yes   ASSISTIVE DEVICES UTILIZED TO PREVENT FALLS:  Life alert? No  Use of a cane, walker or w/c? Yes  Grab bars in the bathroom? Yes  Shower chair or bench in shower? Yes  Elevated toilet seat or a handicapped toilet? Yes   TIMED UP AND GO:  Was the test performed? No .    Cognitive Function:     6CIT Screen 08/28/2021  What Year? 0 points  What month? 0 points  What time? 0 points  Count back from 20 0 points  Months in reverse 2 points  Repeat phrase 0 points  Total Score 2    Immunizations Immunization History  Administered Date(s) Administered   Pneumococcal Conjugate-13 02/21/2015    TDAP status: Due, Education has been provided regarding the importance of this vaccine. Advised may receive this vaccine at local pharmacy or Health Dept. Aware to provide a copy of the vaccination record if obtained from local pharmacy or Health Dept. Verbalized acceptance and understanding.  Flu Vaccine status: Declined, Education has been provided regarding the importance of this vaccine but patient still declined. Advised may receive this vaccine at local pharmacy or Health Dept. Aware to provide a copy of the vaccination record if obtained from local pharmacy or Health Dept. Verbalized acceptance and understanding.  Pneumococcal vaccine status: Declined,  Education has been provided regarding the importance of this vaccine but patient still declined. Advised may receive this vaccine at local  pharmacy or Health Dept. Aware to provide a copy of the vaccination record if obtained from local pharmacy or Health Dept. Verbalized acceptance and understanding.   Covid-19 vaccine status: Declined, Education has been provided regarding the importance of this vaccine but patient still declined. Advised may receive this vaccine at local pharmacy or Health Dept.or vaccine clinic. Aware to provide a copy of the vaccination record if  obtained from local pharmacy or Health Dept. Verbalized acceptance and understanding.  Qualifies for Shingles Vaccine? Yes   Zostavax completed No   Shingrix Completed?: No.    Education has been provided regarding the importance of this vaccine. Patient has been advised to call insurance company to determine out of pocket expense if they have not yet received this vaccine. Advised may also receive vaccine at local pharmacy or Health Dept. Verbalized acceptance and understanding.  Screening Tests Health Maintenance  Topic Date Due   OPHTHALMOLOGY EXAM  05/02/2021   Zoster Vaccines- Shingrix (1 of 2) 08/28/2021 (Originally 06/05/1995)   INFLUENZA VACCINE  10/05/2021 (Originally 02/05/2021)   Pneumonia Vaccine 8+ Years old (2 - PPSV23 if available, else PCV20) 05/28/2022 (Originally 02/21/2016)   TETANUS/TDAP  05/28/2022 (Originally [birth year]04/1964)   DEXA SCAN  08/28/2022 (Originally 06/04/2010)   COVID-19 Vaccine (1) 08/28/2022 (Originally 12/02/1946)   HEMOGLOBIN A1C  11/25/2021   FOOT EXAM  05/28/2022   Hepatitis C Screening  Completed   HPV VACCINES  Aged Out   COLONOSCOPY (Pts 45-29yrs Insurance coverage will need to be confirmed)  Discontinued    Health Maintenance  Health Maintenance Due  Topic Date Due   OPHTHALMOLOGY EXAM  05/02/2021    Colorectal cancer screening: No longer required.   Mammogram status: No longer required due to aged out.  Declined bone density at this time  Lung Cancer Screening: (Low Dose CT Chest recommended if Age 87-80 years, 30 pack-year currently smoking OR have quit w/in 15years.) does not qualify.   Lung Cancer Screening Referral: na  Additional Screening:  Hepatitis C Screening: does qualify; Completed 2021   Vision Screening: Recommended annual ophthalmology exams for early detection of glaucoma and other disorders of the eye. Is the patient up to date with their annual eye exam?  Yes  Who is the provider or what is the name of the office in  which the patient attends annual eye exams? Dr Charlcie Cradle  If pt is not established with a provider, would they like to be referred to a provider to establish care? No .   Dental Screening: Recommended annual dental exams for proper oral hygiene  Community Resource Referral / Chronic Care Management: CRR required this visit?  No   CCM required this visit?  No      Plan:     I have personally reviewed and noted the following in the patients chart:   Medical and social history Use of alcohol, tobacco or illicit drugs  Current medications and supplements including opioid prescriptions.  Functional ability and status Nutritional status Physical activity Advanced directives List of other physicians Hospitalizations, surgeries, and ER visits in previous 12 months Vitals Screenings to include cognitive, depression, and falls Referrals and appointments  In addition, I have reviewed and discussed with patient certain preventive protocols, quality metrics, and best practice recommendations. A written personalized care plan for preventive services as well as general preventive health recommendations were provided to patient.     Lauree Chandler, NP   08/28/2021    Virtual Visit via Telephone Note  I connected  with patient 08/28/21 at  3:15 PM EST by telephone and verified that I am speaking with the correct person using two identifiers.  Location: Patient: home Provider: [facility]   I discussed the limitations, risks, security and privacy concerns of performing an evaluation and management service by telephone and the availability of in person appointments. I also discussed with the patient that there may be a patient responsible charge related to this service. The patient expressed understanding and agreed to proceed.   I discussed the assessment and treatment plan with the patient. The patient was provided an opportunity to ask questions and all were answered. The patient  agreed with the plan and demonstrated an understanding of the instructions.   The patient was advised to call back or seek an in-person evaluation if the symptoms worsen or if the condition fails to improve as anticipated.  I provided 15 minutes of non-face-to-face time during this encounter.  Carlos American. Harle Battiest Avs printed and mailed

## 2021-09-07 ENCOUNTER — Other Ambulatory Visit: Payer: Self-pay

## 2021-09-07 ENCOUNTER — Encounter: Payer: Self-pay | Admitting: Nurse Practitioner

## 2021-09-07 ENCOUNTER — Ambulatory Visit (INDEPENDENT_AMBULATORY_CARE_PROVIDER_SITE_OTHER): Payer: Medicare PPO | Admitting: Nurse Practitioner

## 2021-09-07 VITALS — BP 140/76 | HR 65 | Temp 98.4°F | Ht 62.0 in

## 2021-09-07 DIAGNOSIS — I10 Essential (primary) hypertension: Secondary | ICD-10-CM

## 2021-09-07 DIAGNOSIS — E785 Hyperlipidemia, unspecified: Secondary | ICD-10-CM

## 2021-09-07 DIAGNOSIS — N184 Chronic kidney disease, stage 4 (severe): Secondary | ICD-10-CM

## 2021-09-07 DIAGNOSIS — E1169 Type 2 diabetes mellitus with other specified complication: Secondary | ICD-10-CM | POA: Diagnosis not present

## 2021-09-07 DIAGNOSIS — F419 Anxiety disorder, unspecified: Secondary | ICD-10-CM

## 2021-09-07 DIAGNOSIS — M25371 Other instability, right ankle: Secondary | ICD-10-CM

## 2021-09-07 DIAGNOSIS — I69354 Hemiplegia and hemiparesis following cerebral infarction affecting left non-dominant side: Secondary | ICD-10-CM | POA: Diagnosis not present

## 2021-09-07 DIAGNOSIS — E1122 Type 2 diabetes mellitus with diabetic chronic kidney disease: Secondary | ICD-10-CM | POA: Diagnosis not present

## 2021-09-07 MED ORDER — ASPIRIN 81 MG PO TBEC: 81.0000 mg | DELAYED_RELEASE_TABLET | Freq: Every day | ORAL | 12 refills | Status: DC

## 2021-09-07 NOTE — Progress Notes (Signed)
? ? ?Careteam: ?Patient Care Team: ?Lauree Chandler, NP as PCP - General (Geriatric Medicine) ? ?PLACE OF SERVICE:  ?[facility]  ?Advanced Directive information ?Does Patient Have a Medical Advance Directive?: Yes, Type of Advance Directive: [place];Living will;Out of facility DNR (pink MOST or yellow form), Does patient want to make changes to medical advance directive?: No - Patient declined ? ?Allergies  ?Allergen Reactions  ? Clonidine Anaphylaxis, Swelling and Other (See Comments)  ?  Tongue swelling  ? Norvasc [Amlodipine Besylate] Swelling  ?  Edema and TONGUE swelling ?  ? Lisinopril Cough  ? Morphine And Related   ? Amlodipine Anxiety, Nausea Only and Swelling  ? Codeine Itching and Rash  ? Crestor [Rosuvastatin] Rash  ?  Red and flushed in her face  ? ? ?Chief Complaint  ?Patient presents with  ? Medical Management of Chronic Issues  ?  Routine follow up with fasting labs. Would like to check Vitamin D level.Patient has not been taking Vitamin D.  ? Health Maintenance  ?  Shingrix vaccine, eye exam  ? ? ? ?HPI: Patient is a 76 y.o. female for routine follow up ? ?Did not take blood pressure medication before she came to appt- therapy coming out 4 days a week and blood pressure 120s-130s/80s ? ?She is working hard with therapy.  ? ?Anxiety- enjoying the zoloft, helping anxiety  ?Having more anxiety because this is out of her routine.  ?White coat syndrome making more anxious.  ? ?DM- blood sugars at home 120s-140s ? ?Plans to get fitting for new boot on right ankle- stabilization brace. Going to orthopedic center.  ? ?Review of Systems:  ?Review of Systems  ?Constitutional:  Negative for chills, fever and weight loss.  ?HENT:  Negative for tinnitus.   ?Respiratory:  Negative for cough, sputum production and shortness of breath.   ?Cardiovascular:  Negative for chest pain, palpitations and leg swelling.  ?Gastrointestinal:  Negative for abdominal pain, constipation, diarrhea and  heartburn.  ?Genitourinary:  Negative for dysuria, frequency and urgency.  ?Musculoskeletal:  Negative for back pain, falls, joint pain and myalgias.  ?Skin: Negative.   ?Neurological:  Positive for weakness. Negative for dizziness and headaches.  ?Psychiatric/Behavioral:  Negative for depression and memory loss. The patient is nervous/anxious (improved). The patient does not have insomnia.   ? ?Past Medical History:  ?Diagnosis Date  ? Allergy   ? Arthritis   ? Broken ankle   ? Cataract   ? Diabetes mellitus without complication ([place])   ? History of colonoscopy   ? Hypertension   ? Macular degeneration syndrome   ? with edema---being treated.   ? Stage 4 chronic kidney disease ([place])   ? Stroke [facility]) 2019  ? ?Past Surgical History:  ?Procedure Laterality Date  ? callous removal  Left 2017  ? located on 1st digit on L foot 2/2 diabetes  ? EYE SURGERY    ? ?Social History: ?  reports that she has never smoked. She has never used smokeless tobacco. She reports that she does not drink alcohol and does not use drugs. ? ?Family History  ?Problem Relation Age of Onset  ? Kidney disease Mother   ? Congestive Heart Failure Father   ? COPD Father   ? COPD Brother   ? Diabetes Maternal Aunt   ? ? ?Medications: ?Patient's Medications  ?New Prescriptions  ? No medications on file  ?Previous Medications  ? ACETAMINOPHEN (RA ACETAMINOPHEN) 650 MG CR TABLET  Take 1 tablet (650 mg total) by mouth every 8 (eight) hours as needed for pain.  ? AMBULATORY NON FORMULARY MEDICATION    Medication Name: CBD Oil daily  ? CHOLECALCIFEROL (VITAMIN D3) 25 MCG (1000 UNIT) TABLET    Take 1,000 Units by mouth 2 (two) times a week.  ? FENOFIBRATE (TRICOR) 145 MG TABLET    Take 1 tablet (145 mg total) by mouth daily.  ? LOSARTAN (COZAAR) 25 MG TABLET    TAKE 1 TABLET BY MOUTH TWICE A DAY  ? ONETOUCH VERIO TEST STRIP    1 each 2 (two) times daily.  ? PROBIOTIC PRODUCT (PROBIOTIC DAILY PO)    Take by mouth.  ? SERTRALINE (ZOLOFT) 25 MG TABLET     Take 1 tablet (25 mg total) by mouth daily. Diagnosis Association: Anxiety (F41.9)  ?Modified Medications  ? No medications on file  ?Discontinued Medications  ? No medications on file  ? ? ?Physical Exam: ? ?Vitals:  ? 09/07/21 0806 09/07/21 1011  ?BP: (!) 180/80 140/76  ?Pulse: 65   ?Temp: 98.4 ?F (36.9 ?C)   ?SpO2: 97%   ?Height: $RemoveB'5\' 2"'dUWbcJvA$  (1.575 m)   ? ?Body mass index is 29.45 kg/m?. ?Wt Readings from Last 3 Encounters:  ?10/19/20 161 lb (73 kg)  ?08/18/20 161 lb (73 kg)  ?07/26/20 164 lb 10.9 oz (74.7 kg)  ? ? ?Physical Exam ?Constitutional:   ?   General: She is not in acute distress. ?   Appearance: She is well-developed. She is not diaphoretic.  ?HENT:  ?   Head: Normocephalic and atraumatic.  ?   Mouth/Throat:  ?   Pharynx: No oropharyngeal exudate.  ?Eyes:  ?   Conjunctiva/sclera: Conjunctivae normal.  ?   Pupils: Pupils are equal, round, and reactive to light.  ?Cardiovascular:  ?   Rate and Rhythm: Normal rate and regular rhythm.  ?   Heart sounds: Normal heart sounds.  ?Pulmonary:  ?   Effort: Pulmonary effort is normal.  ?   Breath sounds: Normal breath sounds.  ?Abdominal:  ?   General: Bowel sounds are normal.  ?   Palpations: Abdomen is soft.  ?Musculoskeletal:  ?   Cervical back: Normal range of motion and neck supple.  ?   Right lower leg: No edema.  ?   Left lower leg: No edema.  ?   Comments: Brace to right ankle. ?In whe[place]  ?Skin: ?   General: Skin is warm and dry.  ?Neurological:  ?   Mental Status: She is alert.  ?Psychiatric:     ?   Mood and Affect: Mood normal.  ? ?Labs reviewed: ?Basic Metabolic Panel: ?Recent Labs  ?  10/21/20 ?0127 10/22/20 ?0138 10/24/20 ?0349 01/18/21 ?1152 05/28/21 ?1554  ?NA 139 136 139 138 136  ?K 3.9 3.7 3.9 4.4 4.7  ?CL 110 109 109 104 104  ?CO2 20* 20* $Remov'22 25 21  'tFuUlA$ ?GLUCOSE 115* 133* 123* 154* 188*  ?BUN 33* 28* 28* 32* 37*  ?CREATININE 2.19* 1.86* 1.90* 1.98* 1.82*  ?CALCIUM 8.7* 8.8* 8.8* 9.6 9.2  ?MG 1.9 1.8  --   --   --   ? ?Liver Function Tests: ?Recent Labs   ?  01/18/21 ?1152 05/28/21 ?1554  ?AST 11 10  ?ALT 12 11  ?BILITOT 0.3 0.3  ?PROT 6.5 6.6  ? ?No results for input(s): LIPASE, AMYLASE in the last 8760 hours. ?No results for input(s): AMMONIA in the last 8760 hours. ?CBC: ?Recent Labs  ?  10/21/20 ?0127 10/22/20 ?0138 01/18/21 ?1152 05/28/21 ?1554  ?WBC 8.6 10.2 8.6 10.0  ?NEUTROABS 5.5  --  9,242 6,940  ?HGB 10.6* 11.2* 13.3 13.7  ?HCT 32.1* 33.5* 40.6 42.3  ?MCV 87.0 84.4 84.6 84.1  ?PLT 241 269 334 308  ? ?Lipid Panel: ?Recent Labs  ?  05/28/21 ?1554  ?CHOL 255*  ?HDL 34*  ?TRIG 510*  ?CHOLHDL 7.5*  ? ?TSH: ?No results for input(s): TSH in the last 8760 hours. ?A1C: ?Lab Results  ?Component Value Date  ? HGBA1C 7.7 (H) 05/28/2021  ? ? ? ?Assessment/Plan ?1. Type 2 diabetes mellitus with stage 4 chronic kidney disease, without long-term current use of insulin ([place]) ?-Encouraged dietary compliance, routine foot care/monitoring and to keep up with diabetic eye exams through ophthalmology  ?- Hemoglobin A1c ?- CMP with eGFR([facility]) ? ?2. Hyperlipidemia associated with type 2 diabetes mellitus ([place]) ?-dietary modifications encouraged, continues on zetia, LDL goal <70. ?Unable to tolerate statin ?- Lipid panel ? ?3. Hemiplegia and hemiparesis following cerebral infarction affecting left non-dominant side ([place]) ?-stable, unable to tolerate statin, continues on zetia, encouraged bp, lipid and blood sugar control ?-ASA 81 mg daily recommended ? ?4. Anxiety ?-improved on zoloft 25 mg daily  ? ?5. Essential (primary) hypertension ?Has not taken medication this morning, overall much better controlled at home and improved on recheck ?- CMP with eGFR([facility]) ?- CBC with Differential/Platelet ? ?6. Instability of right ankle joint ?-continues with PT and brace ? ? ?Return in about 6 months (around 03/10/2022) for routine follow up . ?Carlos American. Dewaine Oats, AGNP ? ?[facility] ?[phone]  ?

## 2021-09-07 NOTE — Patient Instructions (Signed)
Make sure ophthalmology fax [place] notes from her visit.  ?

## 2021-09-08 LAB — COMPLETE METABOLIC PANEL WITH GFR
AG Ratio: 1.6 (calc) (ref 1.0–2.5)
ALT: 11 U/L (ref 6–29)
AST: 12 U/L (ref 10–35)
Albumin: 3.8 g/dL (ref 3.6–5.1)
Alkaline phosphatase (APISO): 109 U/L (ref 37–153)
BUN/Creatinine Ratio: 16 (calc) (ref 6–22)
BUN: 32 mg/dL — ABNORMAL HIGH (ref 7–25)
CO2: 24 mmol/L (ref 20–32)
Calcium: 9.3 mg/dL (ref 8.6–10.4)
Chloride: 107 mmol/L (ref 98–110)
Creat: 1.97 mg/dL — ABNORMAL HIGH (ref 0.60–1.00)
Globulin: 2.4 g/dL (calc) (ref 1.9–3.7)
Glucose, Bld: 124 mg/dL — ABNORMAL HIGH (ref 65–99)
Potassium: 4.4 mmol/L (ref 3.5–5.3)
Sodium: 141 mmol/L (ref 135–146)
Total Bilirubin: 0.3 mg/dL (ref 0.2–1.2)
Total Protein: 6.2 g/dL (ref 6.1–8.1)
eGFR: 26 mL/min/{1.73_m2} — ABNORMAL LOW (ref >=60)

## 2021-09-08 LAB — CBC WITH DIFFERENTIAL/PLATELET
Absolute Monocytes: 576 cells/uL (ref 200–950)
Basophils Absolute: 88 cells/uL (ref 0–200)
Basophils Relative: 1.1 %
Eosinophils Absolute: 240 cells/uL (ref 15–500)
Eosinophils Relative: 3 %
HCT: 40.6 % (ref 35.0–45.0)
Hemoglobin: 13.1 g/dL (ref 11.7–15.5)
Lymphs Abs: 1248 cells/uL (ref 850–3900)
MCH: 26.5 pg — ABNORMAL LOW (ref 27.0–33.0)
MCHC: 32.3 g/dL (ref 32.0–36.0)
MCV: 82.2 fL (ref 80.0–100.0)
MPV: 11 fL (ref 7.5–12.5)
Monocytes Relative: 7.2 %
Neutro Abs: 5848 cells/uL (ref 1500–7800)
Neutrophils Relative %: 73.1 %
Platelets: 333 10*3/uL (ref 140–400)
RBC: 4.94 10*6/uL (ref 3.80–5.10)
RDW: 14 % (ref 11.0–15.0)
Total Lymphocyte: 15.6 %
WBC: 8 10*3/uL (ref 3.8–10.8)

## 2021-09-08 LAB — HEMOGLOBIN A1C
Hgb A1c MFr Bld: 7.3 % of total Hgb — ABNORMAL HIGH (ref <5.7)
Mean Plasma Glucose: 163 mg/dL
eAG (mmol/L): 9 mmol/L

## 2021-09-08 LAB — LIPID PANEL
Cholesterol: 223 mg/dL — ABNORMAL HIGH (ref <200)
HDL: 37 mg/dL — ABNORMAL LOW (ref >=50)
LDL Cholesterol (Calc): 151 mg/dL (calc) — ABNORMAL HIGH
Non-HDL Cholesterol (Calc): 186 mg/dL (calc) — ABNORMAL HIGH (ref <130)
Total CHOL/HDL Ratio: 6 (calc) — ABNORMAL HIGH (ref <5.0)
Triglycerides: 211 mg/dL — ABNORMAL HIGH (ref <150)

## 2021-09-10 ENCOUNTER — Encounter: Payer: Self-pay | Admitting: Nurse Practitioner

## 2021-09-10 MED ORDER — EZETIMIBE 10 MG PO TABS: 10.0000 mg | ORAL_TABLET | Freq: Every day | ORAL | 5 refills | Status: DC

## 2021-09-14 ENCOUNTER — Telehealth: Payer: Self-pay

## 2021-09-14 NOTE — Telephone Encounter (Signed)
Incoming call received from [facility] requesting verbal orders for PT ? ?2 times a week for 4 week  ?Then 1 time a week for 4 weeks  ? ?Per [employer] standing order, verbal order given. Message will be sent to patient's provider as a FYI.  ? ?

## 2021-09-21 DIAGNOSIS — M84371A Stress fracture, right ankle, initial encounter for fracture: Secondary | ICD-10-CM | POA: Diagnosis not present

## 2021-09-28 DIAGNOSIS — M9901 Segmental and somatic dysfunction of cervical region: Secondary | ICD-10-CM | POA: Diagnosis not present

## 2021-09-28 DIAGNOSIS — M9902 Segmental and somatic dysfunction of thoracic region: Secondary | ICD-10-CM | POA: Diagnosis not present

## 2021-09-28 DIAGNOSIS — M5459 Other low back pain: Secondary | ICD-10-CM | POA: Diagnosis not present

## 2021-09-28 DIAGNOSIS — M9903 Segmental and somatic dysfunction of lumbar region: Secondary | ICD-10-CM | POA: Diagnosis not present

## 2021-09-28 DIAGNOSIS — R293 Abnormal posture: Secondary | ICD-10-CM | POA: Diagnosis not present

## 2021-10-22 ENCOUNTER — Telehealth: Payer: Self-pay

## 2021-10-22 ENCOUNTER — Encounter: Payer: Self-pay | Admitting: Nurse Practitioner

## 2021-10-22 NOTE — Telephone Encounter (Signed)
New message   Home health calling   1. What are your last 5 BP readings?  180/90 @ 2 min   Manual b/p retake 186/96  2. Are you having any other symptoms (ex. Dizziness, headache, blurred vision, passed out)? No   3. What is your BP issue? Patient had shrimp last night noticed is caused her blood pressure to increase  please advise on taken another blood pressure medication or does the patient needs to be seen

## 2021-10-22 NOTE — Telephone Encounter (Signed)
Daughter plans to monitor and will notify if continues to stay high ?

## 2021-10-25 ENCOUNTER — Ambulatory Visit: Payer: Medicare PPO | Admitting: Podiatry

## 2021-10-29 ENCOUNTER — Ambulatory Visit (INDEPENDENT_AMBULATORY_CARE_PROVIDER_SITE_OTHER): Payer: Medicare PPO | Admitting: Podiatrist

## 2021-10-29 ENCOUNTER — Encounter: Payer: Self-pay | Admitting: Podiatrist

## 2021-10-29 DIAGNOSIS — B351 Tinea unguium: Secondary | ICD-10-CM | POA: Diagnosis not present

## 2021-10-29 DIAGNOSIS — M79675 Pain in left toe(s): Secondary | ICD-10-CM | POA: Diagnosis not present

## 2021-10-29 DIAGNOSIS — M79674 Pain in right toe(s): Secondary | ICD-10-CM

## 2021-10-29 MED ORDER — LOSARTAN POTASSIUM 25 MG PO TABS: 25.0000 mg | ORAL_TABLET | Freq: Two times a day (BID) | ORAL | 1 refills | Status: DC

## 2021-10-29 NOTE — Patient Instructions (Signed)
Try some lamisil or tinactin cream on the bottoms of the left foot (or both) for about a month.  You can also put this on the left thumb- or Funginail is a good nail film that works well for nails.  ?

## 2021-10-29 NOTE — Progress Notes (Signed)
Subjective: ?[name] is a 76 y.o. female patient with history of diabetes who presents to office today complaining of long,mildly painful nails  while ambulating in shoes; unable to trim.  Patient related an ankle fracture that is still giving her some trouble.  She presents today with her daughter and has a wheelchair for ambulation assiatance.   ? ? ?Lauree Chandler, NP  is her primary care physician ? ?Patient Active Problem List  ? Diagnosis Date Noted  ? Ankle fracture 10/22/2020  ? Bimalleolar ankle fracture, right, closed, initial encounter 10/19/2020  ? Cataracts, bilateral 05/03/2020  ? Stage 4 chronic kidney disease ([place]) 12/21/2019  ? Iron deficiency anemia 02/03/2019  ? Pelvic fracture ([place]) 09/21/2018  ? Allergic reaction caused by a drug 05/06/2018  ? Bilateral leg edema 04/13/2018  ? Embolic stroke ([place]) 58/85/0277  ? Left hemiparesis ([place])   ? Diabetes mellitus type 2 in nonobese [facility])   ? Acute ischemic stroke ([place])   ? Left arm weakness   ? Overweight (BMI 25.0-29.9) 02/24/2018  ? Diabetes mellitus without complication ([place]) 41/28/7867  ? HTN (hypertension) 09/02/2017  ? ?Current Outpatient Medications on File Prior to Visit  ?Medication Sig Dispense Refill  ? acetaminophen (RA ACETAMINOPHEN) 650 MG CR tablet Take 1 tablet (650 mg total) by mouth every 8 (eight) hours as needed for pain. 100 tablet 2  ? AMBULATORY NON FORMULARY MEDICATION Medication Name: CBD Oil daily    ? aspirin 81 MG EC tablet Take 1 tablet (81 mg total) by mouth daily. Swallow whole. 30 tablet 12  ? cholecalciferol (VITAMIN D3) 25 MCG (1000 UNIT) tablet Take 1,000 Units by mouth 2 (two) times a week. (Patient not taking: Reported on 07/12/2021)    ? ezetimibe (ZETIA) 10 MG tablet Take 1 tablet (10 mg total) by mouth daily. 30 tablet 5  ? fenofibrate (TRICOR) 145 MG tablet Take 1 tablet (145 mg total) by mouth daily. 30 tablet 1  ? losartan (COZAAR) 25 MG tablet Take 1 tablet (25 mg total) by mouth 2 (two) times daily.  180 tablet 1  ? ONETOUCH VERIO test strip 1 each 2 (two) times daily.    ? Probiotic Product (PROBIOTIC DAILY PO) Take by mouth.    ? sertraline (ZOLOFT) 25 MG tablet Take 1 tablet (25 mg total) by mouth daily. Diagnosis Association: Anxiety (F41.9) 90 tablet 1  ? ?No current facility-administered medications on file prior to visit.  ? ?Allergies  ?Allergen Reactions  ? Clonidine Anaphylaxis, Swelling and Other (See Comments)  ?  Tongue swelling  ? Norvasc [Amlodipine Besylate] Swelling  ?  Edema and TONGUE swelling ?  ? Lisinopril Cough  ? Morphine And Related   ? Amlodipine Anxiety, Nausea Only and Swelling  ? Codeine Itching and Rash  ? Crestor [Rosuvastatin] Rash  ?  Red and flushed in her face  ? ? ? ? ?Objective: ?General: Patient is awake, alert, and oriented x 3 and in no acute distress. ? ?Integument: Skin is warm, dry and supple bilateral. Nails are tender, long, thickened and  ?dystrophic with subungual debris, consistent with onychomycosis, 1-5 bilateral. No signs of infection. Hyperkeratotic lesion of the left heel does not require debridement today.   ? ?Vasculature:  Dorsalis Pedis pulse 2/4 bilateral. Posterior Tibial pulse  1/4 bilateral.  ?Capillary fill time <3 sec 1-5 bilateral. Positive hair growth to the level of the digits. ?Temperature gradient within normal limits. No varicosities present bilateral. No edema present bilateral.  ? ?Neurology:  The patient has intact sensation measured with a 5.07/10g Semmes Weinstein Monofilament at all pedal sites bilateral . Vibratory sensation diminished bilateral with tuning fork. No Babinski sign present bilateral.  ? ?Musculoskeletal: right foot/ ankle laterally oriented/rolling to the lateral side.  Some pain with dorsiflexion noted right ankle vs. Left.  Prominent metatarsal heads with contracture lesser digits noted.   ? ?Assessment and Plan: ?Problem List Items Addressed This Visit   ?None ? ?  ICD-10-CM   ?1. Pain due to onychomycosis of toenails of  both feet  B35.1   ? L37.342   ? A76.811   ?  ? ? ? ?-Examined patient. ?-Discussed and educated patient on diabetic foot care, especially with  ?regards to the vascular, neurological and musculoskeletal systems.  ?-Stressed the importance of good glycemic control and the detriment of not  ?controlling glucose levels in relation to the foot. ?-Mechanically debrided all nails 1-5 bilateral using sterile nail nipper and filed with dremel without incident  ?-Answered all patient questions ?-Patient to return  in 3 months for at risk foot care ?-Patient advised to call the office if any problems or questions arise in the meantime. ? ?Bronson Ing, DPM ?

## 2021-10-30 ENCOUNTER — Encounter: Payer: Self-pay | Admitting: Nurse Practitioner

## 2021-10-31 DIAGNOSIS — M9902 Segmental and somatic dysfunction of thoracic region: Secondary | ICD-10-CM | POA: Diagnosis not present

## 2021-10-31 DIAGNOSIS — M9901 Segmental and somatic dysfunction of cervical region: Secondary | ICD-10-CM | POA: Diagnosis not present

## 2021-10-31 DIAGNOSIS — M9903 Segmental and somatic dysfunction of lumbar region: Secondary | ICD-10-CM | POA: Diagnosis not present

## 2021-10-31 DIAGNOSIS — R293 Abnormal posture: Secondary | ICD-10-CM | POA: Diagnosis not present

## 2021-10-31 DIAGNOSIS — M5459 Other low back pain: Secondary | ICD-10-CM | POA: Diagnosis not present

## 2021-11-02 ENCOUNTER — Other Ambulatory Visit: Payer: Self-pay | Admitting: Nurse Practitioner

## 2021-11-02 ENCOUNTER — Encounter: Payer: Self-pay | Admitting: Nurse Practitioner

## 2021-11-02 MED ORDER — ONETOUCH VERIO VI STRP
ORAL_STRIP | 3 refills | Status: DC
Start: 2021-11-02 — End: 2021-11-06

## 2021-11-02 NOTE — Telephone Encounter (Signed)
Message routed to PCP Eubanks, Jessica K, NP  

## 2021-11-05 ENCOUNTER — Encounter: Payer: Self-pay | Admitting: Nurse Practitioner

## 2021-11-06 ENCOUNTER — Other Ambulatory Visit: Payer: Self-pay | Admitting: *Deleted

## 2021-11-06 ENCOUNTER — Telehealth: Payer: Self-pay | Admitting: *Deleted

## 2021-11-06 MED ORDER — ACCU-CHEK SOFTCLIX LANCETS MISC: 3 refills | Status: DC

## 2021-11-06 MED ORDER — ACCU-CHEK GUIDE CONTROL VI LIQD: 3 refills | Status: DC

## 2021-11-06 MED ORDER — ACCU-CHEK GUIDE VI STRP: ORAL_STRIP | 3 refills | Status: DC

## 2021-11-06 MED ORDER — ACCU-CHEK GUIDE ME W/DEVICE KIT
PACK | 0 refills | Status: DC
Start: 2021-11-06 — End: 2022-01-31

## 2021-11-06 NOTE — Telephone Encounter (Signed)
Daughter had [place] fax over what the insurance would cover in Diabetes Supplies.  ?BD Single Use Swab ?AccuChek Guide ? ?Updated Current medication list and sent in Rx's.  ?

## 2021-11-06 NOTE — Telephone Encounter (Signed)
Spoke with Daughter and she stated that it was [employer] that was sent to pharmacy. Stated that she is going to call the insurance and ask them if One Touch is covered , why did it cost them $200. Stated that she will call us back if anything is needed on our end.  ? ? ? ?Rande Brunt  P Psc Clinical Pool (supporting Lauree Chandler, NP) 14 hours ago (7:52 PM)  ? ?Accu-Chek and OneTouch diabetic supplies that [facility] will cover! Thank you. Sorry about the confusion I know how insurance can be.  ?  ? ?Tiffanie L Dawood  P P[facility] Clinical Pool (supporting Lauree Chandler, NP) 16 hours ago (5:04 PM)  ? ?Yeah,  the three people in there told me to contact my provider. Nothing to do with the insurance company. She has been getting test strips for 30 years and this has never been a problem. ?  ? ?Leigh Aurora C, CMA  Amalia L Drum 17 hours ago (4:45 PM)  ? ?Hello Ly[place], ?  ?You will need to consult with your insurance company and let us know which brand of diabetic supplies they cover. We would have no way of knowing that. The problem is the insurance company are the ones that determine what supplies they cover and that can change from year to year. ?  ?Once you contact the insurance company let us know via mychart or you can call and speak with the clinical intake assistance  ?  ? ?Rande Brunt  P [facility] Clinical Pool (supporting Lauree Chandler, NP) 17 hours ago (4:21 PM)  ? ?We just went to pick up the test strips and they were $200. The pharmacist told me to contact our provider and let them know that they need the ones that are covered by [employer]. We?ve never had this problem so not really sure what?s going on. I tried to get them to do something on their end, but they said there?s nothing they can do ?

## 2021-11-06 NOTE — Addendum Note (Signed)
Addended by: Rafael Bihari A on: 11/06/2021 03:35 PM ? ? Modules accepted: Orders ? ?

## 2021-11-07 ENCOUNTER — Telehealth: Payer: Self-pay

## 2021-11-07 NOTE — Telephone Encounter (Signed)
Physical Therapist Marijean Niemann from [facility] called and states that he needs verbal orders for patient to have extended physical therapy. He's requesting patient has physical therapy 1 time weekly for a week starting 11/11/2021. I confirmed verbal order and I'm sending message as FYI to PCP Lauree Chandler, NP. No further action is required.  ?

## 2021-11-15 ENCOUNTER — Telehealth: Payer: Self-pay | Admitting: *Deleted

## 2021-11-15 NOTE — Telephone Encounter (Signed)
Mor[place] with [facility] [phone] called requesting Verbal order to continue OT 1x8weeks.  ?Verbal orders given.  ?

## 2021-11-19 ENCOUNTER — Telehealth: Payer: Self-pay | Admitting: *Deleted

## 2021-11-19 NOTE — Telephone Encounter (Signed)
Mark with [facility] called requesting verbal order to continue PT 1X8weeks.  ?Verbal orders given.  ?

## 2021-11-23 ENCOUNTER — Telehealth: Payer: Medicare PPO | Admitting: Family

## 2021-11-23 ENCOUNTER — Encounter: Payer: Self-pay | Admitting: Nurse Practitioner

## 2021-11-23 ENCOUNTER — Telehealth: Payer: Self-pay | Admitting: *Deleted

## 2021-11-23 NOTE — Telephone Encounter (Signed)
LMOM for daughter to return call. 

## 2021-11-23 NOTE — Telephone Encounter (Signed)
[name], daughter called and stated that patient has a Sinus Infection with drainage and cough. Stated that drainage is clear and no fever but stated that in the past it got worse. Stated that a antibiotic was given in the past and it cleared it up fast.   Has been trying Coricidin without relief.   Patient could not do an appointment nor Mychart appointment today because she has PT and OT coming out between 1-3.   Requesting an antibiotic to be called in before it worsens.   Please Advise.

## 2021-11-23 NOTE — Telephone Encounter (Signed)
Need to be seen prior to prescribing antibiotics.For clear nasal drainage recommend loratadine 10 mg tablet one by mouth daily.

## 2021-11-26 MED ORDER — LORATADINE 10 MG PO TABS: 10.0000 mg | ORAL_TABLET | Freq: Every day | ORAL | 3 refills | Status: DC

## 2021-11-26 NOTE — Telephone Encounter (Signed)
Patient daughter notified and agreed. Stated that patient is sensitive to medications but they will try this and let us know.

## 2021-12-05 DIAGNOSIS — M5459 Other low back pain: Secondary | ICD-10-CM | POA: Diagnosis not present

## 2021-12-05 DIAGNOSIS — R293 Abnormal posture: Secondary | ICD-10-CM | POA: Diagnosis not present

## 2021-12-05 DIAGNOSIS — M9903 Segmental and somatic dysfunction of lumbar region: Secondary | ICD-10-CM | POA: Diagnosis not present

## 2021-12-05 DIAGNOSIS — M9901 Segmental and somatic dysfunction of cervical region: Secondary | ICD-10-CM | POA: Diagnosis not present

## 2021-12-05 DIAGNOSIS — M9902 Segmental and somatic dysfunction of thoracic region: Secondary | ICD-10-CM | POA: Diagnosis not present

## 2021-12-25 ENCOUNTER — Encounter: Payer: Self-pay | Admitting: Podiatrist

## 2022-01-02 DIAGNOSIS — M9902 Segmental and somatic dysfunction of thoracic region: Secondary | ICD-10-CM | POA: Diagnosis not present

## 2022-01-02 DIAGNOSIS — M9901 Segmental and somatic dysfunction of cervical region: Secondary | ICD-10-CM | POA: Diagnosis not present

## 2022-01-02 DIAGNOSIS — M5459 Other low back pain: Secondary | ICD-10-CM | POA: Diagnosis not present

## 2022-01-02 DIAGNOSIS — R293 Abnormal posture: Secondary | ICD-10-CM | POA: Diagnosis not present

## 2022-01-02 DIAGNOSIS — M9903 Segmental and somatic dysfunction of lumbar region: Secondary | ICD-10-CM | POA: Diagnosis not present

## 2022-01-29 ENCOUNTER — Encounter: Payer: Self-pay | Admitting: Nurse Practitioner

## 2022-01-30 DIAGNOSIS — M9902 Segmental and somatic dysfunction of thoracic region: Secondary | ICD-10-CM | POA: Diagnosis not present

## 2022-01-30 DIAGNOSIS — R293 Abnormal posture: Secondary | ICD-10-CM | POA: Diagnosis not present

## 2022-01-30 DIAGNOSIS — M9901 Segmental and somatic dysfunction of cervical region: Secondary | ICD-10-CM | POA: Diagnosis not present

## 2022-01-30 DIAGNOSIS — M9903 Segmental and somatic dysfunction of lumbar region: Secondary | ICD-10-CM | POA: Diagnosis not present

## 2022-01-30 DIAGNOSIS — M5459 Other low back pain: Secondary | ICD-10-CM | POA: Diagnosis not present

## 2022-01-31 ENCOUNTER — Encounter: Payer: Self-pay | Admitting: Nurse Practitioner

## 2022-01-31 MED ORDER — ACCU-CHEK GUIDE ME W/DEVICE KIT: PACK | 0 refills | Status: DC

## 2022-02-01 ENCOUNTER — Ambulatory Visit: Payer: Medicare PPO | Admitting: Podiatrist

## 2022-02-06 ENCOUNTER — Ambulatory Visit: Payer: Medicare PPO | Admitting: Podiatrist

## 2022-02-07 ENCOUNTER — Ambulatory Visit: Payer: Medicare PPO | Admitting: Adult Health

## 2022-02-07 ENCOUNTER — Ambulatory Visit: Payer: Medicare PPO | Admitting: Nurse Practitioner

## 2022-02-08 ENCOUNTER — Encounter: Payer: Self-pay | Admitting: Nurse Practitioner

## 2022-02-08 DIAGNOSIS — I6930 Unspecified sequelae of cerebral infarction: Secondary | ICD-10-CM

## 2022-02-24 ENCOUNTER — Other Ambulatory Visit: Payer: Self-pay | Admitting: Nurse Practitioner

## 2022-02-24 DIAGNOSIS — I6930 Unspecified sequelae of cerebral infarction: Secondary | ICD-10-CM

## 2022-02-24 DIAGNOSIS — E1122 Type 2 diabetes mellitus with diabetic chronic kidney disease: Secondary | ICD-10-CM

## 2022-02-24 DIAGNOSIS — F419 Anxiety disorder, unspecified: Secondary | ICD-10-CM

## 2022-02-24 DIAGNOSIS — N184 Chronic kidney disease, stage 4 (severe): Secondary | ICD-10-CM

## 2022-02-24 DIAGNOSIS — I69354 Hemiplegia and hemiparesis following cerebral infarction affecting left non-dominant side: Secondary | ICD-10-CM

## 2022-02-24 DIAGNOSIS — E1169 Type 2 diabetes mellitus with other specified complication: Secondary | ICD-10-CM

## 2022-02-25 ENCOUNTER — Other Ambulatory Visit: Payer: Medicare PPO

## 2022-02-25 DIAGNOSIS — N184 Chronic kidney disease, stage 4 (severe): Secondary | ICD-10-CM | POA: Diagnosis not present

## 2022-02-25 DIAGNOSIS — F419 Anxiety disorder, unspecified: Secondary | ICD-10-CM | POA: Diagnosis not present

## 2022-02-25 DIAGNOSIS — I69354 Hemiplegia and hemiparesis following cerebral infarction affecting left non-dominant side: Secondary | ICD-10-CM | POA: Diagnosis not present

## 2022-02-25 DIAGNOSIS — I6930 Unspecified sequelae of cerebral infarction: Secondary | ICD-10-CM | POA: Diagnosis not present

## 2022-02-25 DIAGNOSIS — E1122 Type 2 diabetes mellitus with diabetic chronic kidney disease: Secondary | ICD-10-CM | POA: Diagnosis not present

## 2022-02-26 ENCOUNTER — Ambulatory Visit: Payer: Medicare PPO | Admitting: Nurse Practitioner

## 2022-02-26 ENCOUNTER — Encounter: Payer: Self-pay | Admitting: Nurse Practitioner

## 2022-02-26 ENCOUNTER — Ambulatory Visit: Payer: Medicare PPO | Admitting: Podiatrist

## 2022-02-26 ENCOUNTER — Other Ambulatory Visit: Payer: Self-pay

## 2022-02-26 DIAGNOSIS — B351 Tinea unguium: Secondary | ICD-10-CM | POA: Diagnosis not present

## 2022-02-26 DIAGNOSIS — M79675 Pain in left toe(s): Secondary | ICD-10-CM | POA: Diagnosis not present

## 2022-02-26 DIAGNOSIS — M79674 Pain in right toe(s): Secondary | ICD-10-CM | POA: Diagnosis not present

## 2022-02-26 LAB — COMPLETE METABOLIC PANEL WITH GFR
AG Ratio: 1.5 (calc) (ref 1.0–2.5)
ALT: 13 U/L (ref 6–29)
AST: 14 U/L (ref 10–35)
Albumin: 4 g/dL (ref 3.6–5.1)
Alkaline phosphatase (APISO): 125 U/L (ref 37–153)
BUN/Creatinine Ratio: 15 (calc) (ref 6–22)
BUN: 31 mg/dL — ABNORMAL HIGH (ref 7–25)
CO2: 21 mmol/L (ref 20–32)
Calcium: 9.4 mg/dL (ref 8.6–10.4)
Chloride: 107 mmol/L (ref 98–110)
Creat: 2.07 mg/dL — ABNORMAL HIGH (ref 0.60–1.00)
Globulin: 2.7 g/dL (calc) (ref 1.9–3.7)
Glucose, Bld: 132 mg/dL — ABNORMAL HIGH (ref 65–99)
Potassium: 4.7 mmol/L (ref 3.5–5.3)
Sodium: 140 mmol/L (ref 135–146)
Total Bilirubin: 0.3 mg/dL (ref 0.2–1.2)
Total Protein: 6.7 g/dL (ref 6.1–8.1)
eGFR: 24 mL/min/{1.73_m2} — ABNORMAL LOW (ref >=60)

## 2022-02-26 LAB — TSH: TSH: 4.29 mIU/L (ref 0.40–4.50)

## 2022-02-26 LAB — CBC WITH DIFFERENTIAL/PLATELET
Absolute Monocytes: 624 cells/uL (ref 200–950)
Basophils Absolute: 78 cells/uL (ref 0–200)
Basophils Relative: 1 %
Eosinophils Absolute: 515 cells/uL — ABNORMAL HIGH (ref 15–500)
Eosinophils Relative: 6.6 %
HCT: 43.1 % (ref 35.0–45.0)
Hemoglobin: 14.1 g/dL (ref 11.7–15.5)
Lymphs Abs: 1591 cells/uL (ref 850–3900)
MCH: 27 pg (ref 27.0–33.0)
MCHC: 32.7 g/dL (ref 32.0–36.0)
MCV: 82.6 fL (ref 80.0–100.0)
MPV: 10.1 fL (ref 7.5–12.5)
Monocytes Relative: 8 %
Neutro Abs: 4992 cells/uL (ref 1500–7800)
Neutrophils Relative %: 64 %
Platelets: 399 10*3/uL (ref 140–400)
RBC: 5.22 10*6/uL — ABNORMAL HIGH (ref 3.80–5.10)
RDW: 14.2 % (ref 11.0–15.0)
Total Lymphocyte: 20.4 %
WBC: 7.8 10*3/uL (ref 3.8–10.8)

## 2022-02-26 LAB — HEMOGLOBIN A1C
Hgb A1c MFr Bld: 7.7 % of total Hgb — ABNORMAL HIGH (ref <5.7)
Mean Plasma Glucose: 174 mg/dL
eAG (mmol/L): 9.7 mmol/L

## 2022-02-26 LAB — VITAMIN D 25 HYDROXY (VIT D DEFICIENCY, FRACTURES): Vit D, 25-Hydroxy: 43 ng/mL (ref 30–100)

## 2022-02-26 LAB — LIPID PANEL
Cholesterol: 214 mg/dL — ABNORMAL HIGH (ref <200)
HDL: 39 mg/dL — ABNORMAL LOW (ref >=50)
LDL Cholesterol (Calc): 141 mg/dL (calc) — ABNORMAL HIGH
Non-HDL Cholesterol (Calc): 175 mg/dL (calc) — ABNORMAL HIGH (ref <130)
Total CHOL/HDL Ratio: 5.5 (calc) — ABNORMAL HIGH (ref <5.0)
Triglycerides: 205 mg/dL — ABNORMAL HIGH (ref <150)

## 2022-02-26 MED ORDER — EZETIMIBE 10 MG PO TABS
10.0000 mg | ORAL_TABLET | Freq: Every day | ORAL | 1 refills | Status: DC
Start: 2022-02-26 — End: 2022-08-14

## 2022-02-26 MED ORDER — EZETIMIBE 10 MG PO TABS: 10.0000 mg | ORAL_TABLET | Freq: Every day | ORAL | 1 refills | Status: DC

## 2022-02-26 MED ORDER — KETOCONAZOLE 2 % EX CREA: TOPICAL_CREAM | CUTANEOUS | 2 refills | Status: DC

## 2022-02-27 ENCOUNTER — Ambulatory Visit: Payer: Medicare Other | Admitting: Nurse Practitioner

## 2022-03-04 ENCOUNTER — Ambulatory Visit (INDEPENDENT_AMBULATORY_CARE_PROVIDER_SITE_OTHER): Payer: Medicare PPO | Admitting: Nurse Practitioner

## 2022-03-04 ENCOUNTER — Encounter: Payer: Self-pay | Admitting: Podiatrist

## 2022-03-04 ENCOUNTER — Encounter: Payer: Self-pay | Admitting: Nurse Practitioner

## 2022-03-04 VITALS — BP 150/98 | HR 76 | Temp 95.4°F | Resp 20 | Ht 62.0 in | Wt 128.0 lb

## 2022-03-04 DIAGNOSIS — S82841P Displaced bimalleolar fracture of right lower leg, subsequent encounter for closed fracture with malunion: Secondary | ICD-10-CM | POA: Diagnosis not present

## 2022-03-04 DIAGNOSIS — I69354 Hemiplegia and hemiparesis following cerebral infarction affecting left non-dominant side: Secondary | ICD-10-CM

## 2022-03-04 DIAGNOSIS — R2681 Unsteadiness on feet: Secondary | ICD-10-CM

## 2022-03-04 DIAGNOSIS — M25371 Other instability, right ankle: Secondary | ICD-10-CM

## 2022-03-04 NOTE — Patient Instructions (Signed)
[employer] for power wheelchair

## 2022-03-04 NOTE — Progress Notes (Signed)
Chief Complaint  Patient presents with   Nail Problem    RFC bilateral nail trim 1-5      HPI: Patient is 76 y.o. female who presents today for painful thickened toenails of bilateral feet which are each she is unable to trim.  They are painful with ambulation and enclosed shoe gear.  She denies any trauma or injury to the feet no new problems are reported.   Allergies  Allergen Reactions   Clonidine Anaphylaxis, Swelling and Other (See Comments)    Tongue swelling   Norvasc [Amlodipine Besylate] Swelling    Edema and TONGUE swelling    Lisinopril Cough   Morphine And Related    Amlodipine Anxiety, Nausea Only and Swelling   Codeine Itching and Rash   Crestor [Rosuvastatin] Rash    Red and flushed in her face   Objective: General: Patient is awake, alert, and oriented x 3 and in no acute distress.   Integument: Skin is warm, dry and supple bilateral. Nails are tender, long, thickened and  dystrophic with subungual debris, consistent with onychomycosis, 1-5 bilateral. No signs of infection. Hyperkeratotic lesion of the left heel does not require debridement today.     Vasculature:  Dorsalis Pedis pulse 2/4 bilateral. Posterior Tibial pulse  1/4 bilateral.  Capillary fill time <3 sec 1-5 bilateral. Positive hair growth to the level of the digits. Temperature gradient within normal limits. No varicosities present bilateral. No edema present bilateral.    Neurology: The patient has intact sensation measured with a 5.07/10g Semmes Weinstein Monofilament at all pedal sites bilateral . Vibratory sensation diminished bilateral with tuning fork. No Babinski sign present bilateral.    Musculoskeletal: right foot/ ankle laterally oriented/rolling to the lateral side.  Some pain with dorsiflexion noted right ankle vs. Left.  Prominent metatarsal heads with contracture lesser digits noted.     Assessment and Plan: Problem List Items Addressed This Visit   None       ICD-10-CM    1. Pain  due to onychomycosis of toenails of both feet  B35.1      M79.675      M79.674             -Examined patient. -Mechanically debrided all nails 1-5 bilateral using sterile nail nipper and filed with dremel without incident  -Answered all patient questions -Patient to return  in 3 months for at risk foot care -Patient advised to call the office if any problems or questions arise in the meantime.

## 2022-03-04 NOTE — Progress Notes (Signed)
Careteam: Patient Care Team: Lauree Chandler, NP as PCP - General (Geriatric Medicine)  PLACE OF SERVICE:  [place]  Advanced Directive information    Allergies  Allergen Reactions   Clonidine Anaphylaxis, Swelling and Other (See Comments)    Tongue swelling   Norvasc [Amlodipine Besylate] Swelling    Edema and TONGUE swelling    Lisinopril Cough   Morphine And Related    Amlodipine Anxiety, Nausea Only and Swelling   Codeine Itching and Rash   Crestor [Rosuvastatin] Rash    Red and flushed in her face    Chief Complaint  Patient presents with   Acute Visit    MOBILITY EVALUATION Patient and daughter would like her to have a electric wheelchair. Daughter reports she mainly needs something for strengthening due to a stroke 3 years ago. Patient is currently doing physical therapy but daughter states it not enough time.     HPI: Patient is a 76 y.o. female here today for mobility assessment.  Pt and her daughter feel like she would benefit from electric wheelchair.  Hx of CVA with left sided weakness, hx of left hip fracture, right sided bimalleolar fracture which has caused her to be immobile and limited her independence.  She has worked hard to increase mobility over the last 3 years, 1.5 years ago she had an ankle fracture and that had again limited mobility despite physical therapy.  She is unable to transfer safely alone.  She is currently using a wheelchair which she has to have assistance in moving around her home. She is unable to stand due to hx of ankle fracture and her immobility/balance with her right ankle.  She also has left sided weakness from her stroke. She is unable to propel wheelchair due to weakness in left hand and due to both legs being weak can not self proper wheelchair.  She requires a brace for right ankle due to fracture and instability of right ankle.  She will be able to move around her home if she had a power wheelchair as she could use  the   She is unable to use a POV scooter due to the tiller steering column from her left sided weakness from stroke. She also has a tendency to lean to the left due to the stroke.  She has the mental capacity to safely operate a power wheelchair in the home, THIS WILL SIGNIFICANTLY help her independence in the home as she will be able get around her home without assistance from her family.  Physically she good right handed strength and could use a joystick to drive power wheelchair.    Review of Systems:  Review of Systems  Constitutional:  Negative for chills, fever and weight loss.  Cardiovascular:  Negative for chest pain and leg swelling.  Gastrointestinal:  Negative for abdominal pain, constipation, diarrhea and heartburn.  Musculoskeletal:  Negative for back pain, falls, joint pain and myalgias.  Skin: Negative.   Neurological:  Positive for weakness. Negative for dizziness and headaches.  Psychiatric/Behavioral:  Negative for memory loss. The patient does not have insomnia.    Past Medical History:  Diagnosis Date   Allergy    Arthritis    Broken ankle    Cataract    Diabetes mellitus without complication ([place])    History of colonoscopy    Hypertension    Macular degeneration syndrome    with edema---being treated.    Stage 4 chronic kidney disease ([place])    Stroke (  [place]) 2019   Past Surgical History:  Procedure Laterality Date   callous removal  Left 2017   located on 1st digit on L foot 2/2 diabetes   EYE SURGERY     Social History:   reports that she has never smoked. She has never used smokeless tobacco. She reports that she does not drink alcohol and does not use drugs.  Family History  Problem Relation Age of Onset   Kidney disease Mother    Congestive Heart Failure Father    COPD Father    COPD Brother    Diabetes Maternal Aunt     Medications: Patient's Medications  New Prescriptions   No medications on file  Previous Medications   ACCU-CHEK SOFTCLIX  LANCETS LANCETS    Use to test blood sugar twice daily. Dx: E11.22   ACETAMINOPHEN (RA ACETAMINOPHEN) 650 MG CR TABLET    Take 1 tablet (650 mg total) by mouth every 8 (eight) hours as needed for pain.   AMBULATORY NON FORMULARY MEDICATION    Medication Name: CBD Oil daily   ASPIRIN 81 MG EC TABLET    Take 1 tablet (81 mg total) by mouth daily. Swallow whole.   BLOOD GLUCOSE CALIBRATION (ACCU-CHEK GUIDE CONTROL) LIQD    Use in Checking blood sugar twice daily. Dx: E11.22   BLOOD GLUCOSE MONITORING SUPPL (ACCU-CHEK GUIDE ME) W/DEVICE KIT    Use to test blood sugar twice daily. Dx: E11.22   CHOLECALCIFEROL (VITAMIN D3) 25 MCG (1000 UNIT) TABLET    Take 1,000 Units by mouth 2 (two) times a week.   EZETIMIBE (ZETIA) 10 MG TABLET    Take 1 tablet (10 mg total) by mouth daily.   FENOFIBRATE (TRICOR) 145 MG TABLET    Take 1 tablet (145 mg total) by mouth daily.   GLUCOSE BLOOD (ACCU-CHEK GUIDE) TEST STRIP    Use to check blood sugar twice daily. Dx: E11.22   KETOCONAZOLE (NIZORAL) 2 % CREAM    Apply to feet twice a day for 3 weeks   LORATADINE (CLARITIN) 10 MG TABLET    Take 1 tablet (10 mg total) by mouth daily.   LOSARTAN (COZAAR) 25 MG TABLET    Take 1 tablet (25 mg total) by mouth 2 (two) times daily.   PROBIOTIC PRODUCT (PROBIOTIC DAILY PO)    Take by mouth.   SERTRALINE (ZOLOFT) 25 MG TABLET    Take 1 tablet (25 mg total) by mouth daily. Diagnosis Association: Anxiety (F41.9)  Modified Medications   No medications on file  Discontinued Medications   No medications on file    Physical Exam:  Vitals:   03/04/22 0830  BP: (!) 150/98  Pulse: 76  Resp: 20  Temp: (!) 95.4 F (35.2 C)  SpO2: 97%  Weight: 128 lb (58.1 kg)  Height: $Remove'5\' 2"'EEQztZk$  (1.575 m)   Body mass index is 23.41 kg/m. Wt Readings from Last 3 Encounters:  03/04/22 128 lb (58.1 kg)  10/19/20 161 lb (73 kg)  08/18/20 161 lb (73 kg)    Physical Exam Constitutional:      General: She is not in acute distress.    Appearance:  She is well-developed. She is not diaphoretic.  HENT:     Head: Normocephalic and atraumatic.     Mouth/Throat:     Pharynx: No oropharyngeal exudate.  Eyes:     Conjunctiva/sclera: Conjunctivae normal.     Pupils: Pupils are equal, round, and reactive to light.  Cardiovascular:     Rate  and Rhythm: Normal rate and regular rhythm.     Heart sounds: Normal heart sounds.  Pulmonary:     Effort: Pulmonary effort is normal.     Breath sounds: Normal breath sounds.  Abdominal:     General: Bowel sounds are normal.     Palpations: Abdomen is soft.  Musculoskeletal:     Right shoulder: Normal.     Left shoulder: Decreased range of motion.     Right hand: Normal range of motion. Normal strength (5/5).     Left hand: Decreased range of motion. Decreased strength (3/5).     Cervical back: Normal range of motion and neck supple.     Right lower leg: No edema.     Left lower leg: No edema.     Right ankle: Deformity present. Decreased range of motion.     Left ankle: No deformity. Normal range of motion.     Comments: Leans to the left during visit, she is able to fix posture with redirection Right ankle with brace  Right lower leg 3/5 weakness Left lower leg 3/5 weakness  Skin:    General: Skin is warm and dry.  Neurological:     Mental Status: She is alert.     Motor: Weakness present.     Gait: Gait abnormal.  Psychiatric:        Mood and Affect: Mood normal.     Labs reviewed: Basic Metabolic Panel: Recent Labs    05/28/21 1554 09/07/21 0831 02/25/22 0938  NA 136 141 140  K 4.7 4.4 4.7  CL 104 107 107  CO2 $Re'21 24 21  'slv$ GLUCOSE 188* 124* 132*  BUN 37* 32* 31*  CREATININE 1.82* 1.97* 2.07*  CALCIUM 9.2 9.3 9.4  TSH  --   --  4.29   Liver Function Tests: Recent Labs    05/28/21 1554 09/07/21 0831 02/25/22 0938  AST $Re'10 12 14  'ZIc$ ALT $R'11 11 13  'Wi$ BILITOT 0.3 0.3 0.3  PROT 6.6 6.2 6.7   No results for input(s): "LIPASE", "AMYLASE" in the last 8760 hours. No results for  input(s): "AMMONIA" in the last 8760 hours. CBC: Recent Labs    05/28/21 1554 09/07/21 0831 02/25/22 0938  WBC 10.0 8.0 7.8  NEUTROABS 6,940 5,848 4,992  HGB 13.7 13.1 14.1  HCT 42.3 40.6 43.1  MCV 84.1 82.2 82.6  PLT 308 333 399   Lipid Panel: Recent Labs    05/28/21 1554 09/07/21 0831 02/25/22 0938  CHOL 255* 223* 214*  HDL 34* 37* 39*  LDLCALC  --  151* 141*  TRIG 510* 211* 205*  CHOLHDL 7.5* 6.0* 5.5*   TSH: Recent Labs    02/25/22 0938  TSH 4.29   A1C: Lab Results  Component Value Date   HGBA1C 7.7 (H) 02/25/2022     Assessment/Plan 1. Hemiplegia and hemiparesis following cerebral infarction affecting left non-dominant side ([place]) -she has left sided weakness related to CVA and would benefit from power wheelchair to help her independence in the home. Currently she is unable to self propel herself in wheelchair due to weakness from CVA and ankle fracture.  - DME Wheelchair electric - Ambulatory referral to Physical Therapy  2. Instability of right ankle joint -continues to wear brace to right ankle.  Would benefit from power wheelchair to be able to independently travel through her home. - DME Wheelchair electric - Ambulatory referral to Physical Therapy  3. Closed bimalleolar fracture of right ankle with malunion, subsequent encounte -did not  have surgery reconstruction and now with deformity and weakness.  - DME Wheelchair electric - Ambulatory referral to Physical Therapy  4. Unstable gait - DME Wheelchair electric would allow her to be more independent in her home.  - Ambulatory referral to Physical Therapy   Carlos American. [place], [facility] [phone]

## 2022-03-06 DIAGNOSIS — M9901 Segmental and somatic dysfunction of cervical region: Secondary | ICD-10-CM | POA: Diagnosis not present

## 2022-03-06 DIAGNOSIS — M9902 Segmental and somatic dysfunction of thoracic region: Secondary | ICD-10-CM | POA: Diagnosis not present

## 2022-03-06 DIAGNOSIS — M5459 Other low back pain: Secondary | ICD-10-CM | POA: Diagnosis not present

## 2022-03-06 DIAGNOSIS — M9903 Segmental and somatic dysfunction of lumbar region: Secondary | ICD-10-CM | POA: Diagnosis not present

## 2022-03-06 DIAGNOSIS — R293 Abnormal posture: Secondary | ICD-10-CM | POA: Diagnosis not present

## 2022-03-12 ENCOUNTER — Telehealth: Payer: Self-pay

## 2022-03-12 NOTE — Telephone Encounter (Signed)
Infinity with [facility] outpatient rehab called requesting copy of wheelchair order be faxed to them so they can have a record of it. Order printed and faxed [phone].

## 2022-03-13 ENCOUNTER — Ambulatory Visit: Payer: Medicare PPO | Attending: Nurse Practitioner | Admitting: Rehabilitation

## 2022-03-13 ENCOUNTER — Other Ambulatory Visit: Payer: Self-pay

## 2022-03-13 ENCOUNTER — Encounter: Payer: Self-pay | Admitting: Rehabilitation

## 2022-03-13 DIAGNOSIS — R293 Abnormal posture: Secondary | ICD-10-CM | POA: Insufficient documentation

## 2022-03-13 DIAGNOSIS — S82841P Displaced bimalleolar fracture of right lower leg, subsequent encounter for closed fracture with malunion: Secondary | ICD-10-CM | POA: Diagnosis not present

## 2022-03-13 DIAGNOSIS — R2681 Unsteadiness on feet: Secondary | ICD-10-CM | POA: Insufficient documentation

## 2022-03-13 DIAGNOSIS — M6281 Muscle weakness (generalized): Secondary | ICD-10-CM | POA: Diagnosis not present

## 2022-03-13 DIAGNOSIS — M25371 Other instability, right ankle: Secondary | ICD-10-CM | POA: Insufficient documentation

## 2022-03-13 DIAGNOSIS — I69354 Hemiplegia and hemiparesis following cerebral infarction affecting left non-dominant side: Secondary | ICD-10-CM | POA: Diagnosis not present

## 2022-03-13 NOTE — Addendum Note (Signed)
Addended by: Cameron Sprang A on: 03/13/2022 10:19 AM   Modules accepted: Orders

## 2022-03-13 NOTE — Therapy (Signed)
OUTPATIENT PHYSICAL THERAPY NEURO EVALUATION   Patient Name: [name] MRN: [id number] DOB:1945-10-25, 76 y.o., female Today's Date: 03/13/2022   PCP: Sherrie Mustache, NP REFERRING PROVIDER: Sherrie Mustache, NP   PT End of Session - 03/13/22 0849     Visit Number 1    Number of Visits 17    Date for PT Re-Evaluation 05/12/22    Authorization Type [employer] (10th visit PN needed)    Progress Note Due on Visit 10    PT Start Time 0845    PT Stop Time 0940    PT Time Calculation (min) 55 min    Activity Tolerance Patient tolerated treatment well;Patient limited by fatigue    Behavior During Therapy [facility] for tasks assessed/performed             Past Medical History:  Diagnosis Date   Allergy    Arthritis    Broken ankle    Cataract    Diabetes mellitus without complication ([place])    History of colonoscopy    Hypertension    Macular degeneration syndrome    with edema---being treated.    Stage 4 chronic kidney disease ([place])    Stroke ([place]) 2019   Past Surgical History:  Procedure Laterality Date   callous removal  Left 2017   located on 1st digit on L foot 2/2 diabetes   EYE SURGERY     Patient Active Problem List   Diagnosis Date Noted   Ankle fracture 10/22/2020   Bimalleolar ankle fracture, right, closed, initial encounter 10/19/2020   Cataracts, bilateral 05/03/2020   Stage 4 chronic kidney disease ([place]) 12/21/2019   Iron deficiency anemia 02/03/2019   Pelvic fracture ([place]) 09/21/2018   Allergic reaction caused by a drug 05/06/2018   Bilateral leg edema 25/95/6387   Embolic stroke ([place]) 56/43/3295   Left hemiparesis ([place])    Diabetes mellitus type 2 in nonobese ([place])    Acute ischemic stroke ([place])    Left arm weakness    Overweight (BMI 25.0-29.9) 02/24/2018   Diabetes mellitus without complication ([place]) 18/84/1660   HTN (hypertension) 09/02/2017    ONSET DATE: CVA 2020, ankle fracture 2022  REFERRING DIAG: I69.354 (ICD-10-CM) -  Hemiplegia and hemiparesis following cerebral infarction affecting left non-dominant side (HCC) M25.371 (ICD-10-CM) - Instability of right ankle joint S82.841P (ICD-10-CM) - Closed bimalleolar fracture of right ankle with malunion, subsequent encounter R26.81 (ICD-10-CM) - Unstable gait   THERAPY DIAG:  Unsteadiness on feet  Muscle weakness (generalized)  Hemiplegia and hemiparesis following cerebral infarction affecting left non-dominant side (HCC)  Abnormal posture  Rationale for Evaluation and Treatment Rehabilitation  SUBJECTIVE:  SUBJECTIVE STATEMENT: Per pt, "I would like to get out of this w/c and stop wearing diapers." Daughter reports she is transferring but daughter feels she is helping more than maybe needed.   Pt accompanied by:  daughter [name]  PERTINENT HISTORY:   PAIN:  Are you having pain? No  PRECAUTIONS: Fall  WEIGHT BEARING RESTRICTIONS No WBAT in R LE   FALLS: Has patient fallen in last 6 months? No  LIVING ENVIRONMENT: Lives with: lives with their family Lives in: House/apartment Stairs: No Has following equipment at home: Environmental consultant - 2 wheeled, Wheelchair (manual), and Ramped entry  PLOF: Independent with basic ADLs and Independent with household mobility without device (before CVA and ankle fracture)  PATIENT GOALS "To get out of this w/c and to stop wearing diapers."    OBJECTIVE:   DIAGNOSTIC FINDINGS:   COGNITION: Overall cognitive status: Within functional limits for tasks assessed   SENSATION: WFL  COORDINATION: Grossly WFL, some weakness in B hips.     MUSCLE LENGTH: Tightness noted in B knees, unable to get to neutral (0 deg extension), approx 10-15 deg lacking.  Also tightness noted in B hips (L>R).   POSTURE: rounded shoulders, forward head,  increased thoracic kyphosis, posterior pelvic tilt, and weight shift left     LOWER EXTREMITY MMT:    MMT Right Eval Left Eval  Hip flexion 3+/5 3+/5  Hip extension    Hip abduction 4/5 4/5  Hip adduction 4/5 4/5  Hip internal rotation    Hip external rotation    Knee flexion 3+/5 4/5  Knee extension 4/5 4/5  Ankle dorsiflexion 3-/5 3+/5  Ankle plantarflexion 4/5 4/5  Ankle inversion    Ankle eversion    (Blank rows = not tested).  All testing done in seated position.   BED MOBILITY:  Sit to supine Min A Supine to sit Min A and Mod A  TRANSFERS: Assistive device utilized: Wheelchair (manual) In // bars Sit to stand: Mod A Stand to sit: Mod A Chair to chair: Mod A and Max A (squat pivot) Floor:  n/a  GAIT: Gait pattern:  Only able to attempt stepping RLE forward/backwards in // bars today with max A Distance walked: 1' Assistive device utilized:  // bars Level of assistance: Max A Comments: see above    TODAY'S TREATMENT:    Education and demo on how to provide less assistance when going from supine to sit and cues to provide (to daughter).    PATIENT EDUCATION: Education details: Education on POC, goals, checking their manual w/c if it can be elevated so can begin to work on functional transfers (unable to do squat pivot in current transport chair), otherwise finding a manual chair to use to work on functional transfers and improved independence at home.  PT educated that she did not want to get a custom manual chair now and then a few years down the road need a power w/c and not be able to get it.   Person educated: Patient and Child(ren) Education method: Explanation, Demonstration, and Verbal cues Education comprehension: verbalized understanding and needs further education   HOME EXERCISE PROGRAM: Educated to begin working on ADLs (upper body and some lower body bathing, brushing teeth/hair) seated at EOB with feet supported and S from daughter in order to  work on improved trunk activation and upright tolerance/endurance.      GOALS: Goals reviewed with patient? Yes  SHORT TERM GOALS: Target date: 04/10/2022  Pt/daughter will be IND with initial  HEP in order to indicate improved functional mobility and dec fall risk. Baseline: Goal status: INITIAL  2.  Pt will perform all aspects of bed mobility at S level in order to indicate improved independence at home.   Baseline:  Goal status: INITIAL  3.  Pt will perform squat pivot transfers at min A level in order to indicate improved functional mobility.  Baseline:  Goal status: INITIAL  4.  Pt will perform sit<>stand to RW at mod A level in order to indicate improved functional mobility and transfers.  Baseline:  Goal status: INITIAL  5.  Pt will perform stand pivot transfers with RW at mod A level in order to indicate improved functional mobility.   Baseline:  Goal status: INITIAL  6.  Will initiate gait in // bars as able and update LTG as able.  Baseline:  Goal status: INITIAL  7.  Pt will report being able to sit unsupported at home to perform ADLs x 10 mins without LOB.   Baseline:  Goal status: INITIAL  LONG TERM GOALS: Target date: 05/12/2022  Pt/daughter will be IND with final HEP in order to indicate improved functional mobility and dec fall risk. Baseline:  Goal status: INITIAL  2.  Pt will perform all aspects of bed mobility at mod I level in order to indicate improved functional mobility.   Baseline:  Goal status: INITIAL  3.  Pt will perform squat pivot transfers (to/from w/c and bedside commode/shower chair) at S level in order to indicate improved functional independence.  Baseline:  Goal status: INITIAL  4.  Pt will perform sit<>stand with RW at min A level in order to indicate improved functional mobility.   Baseline:  Goal status: INITIAL  5.  Pt will perform stand pivot transfer with RW at min A level in order to indicate improved functional mobility.   Baseline:  Goal status: INITIAL  6.  Will add gait goal as appropriate.  Baseline:  Goal status: INITIAL  ASSESSMENT:  CLINICAL IMPRESSION: Patient is a 76 y.o. female who was seen today for physical therapy evaluation and treatment for old CVA with L hemiparesis and newer (a year ago) bimalleolar fracture (non surgical).  Past medical history includes HTN; DM; CVA with resultant L hemiplegia; and macular degeneration.  Upon PT evaluation, note that she has custom ASO for R ankle and is now WBAT.  She does some standing at home with daughters assistance, but only during transfers.  Is utilizing transport w/c at this time and cannot perform functional squat pivot transfers.  They do have a manual w/c at home that is very low so they are going to see if it can be elevated.  She requires min to mod/max A for all aspects of mobility today during session.  Pt will benefit from skilled OP neuro PT in order to address deficits.     OBJECTIVE IMPAIRMENTS Abnormal gait, decreased activity tolerance, decreased balance, decreased endurance, decreased mobility, difficulty walking, decreased ROM, decreased strength, hypomobility, impaired perceived functional ability, impaired flexibility, impaired UE functional use, and postural dysfunction.   ACTIVITY LIMITATIONS carrying, lifting, bending, sitting, standing, squatting, stairs, transfers, bed mobility, bathing, toileting, dressing, and locomotion level  PARTICIPATION LIMITATIONS: meal prep, cleaning, laundry, shopping, and community activity  PERSONAL FACTORS Age, Time since onset of injury/illness/exacerbation, and 3+ comorbidities: see above  are also affecting patient's functional outcome.   REHAB POTENTIAL: Good  CLINICAL DECISION MAKING: Evolving/moderate complexity  EVALUATION COMPLEXITY: Moderate  PLAN: PT FREQUENCY: 2x/week  PT DURATION: 8 weeks  PLANNED INTERVENTIONS: Therapeutic exercises, Therapeutic activity, Neuromuscular  re-education, Balance training, Gait training, Patient/Family education, Self Care, Joint mobilization, Orthotic/Fit training, DME instructions, Aquatic Therapy, and Wheelchair mobility training  PLAN FOR NEXT SESSION: Initiate supine/seated HEP (if seated work unsupported for extra challenge), core strength, bed mobility, hip and knee flexibility, begin to work on squat pivot transfers.  If they bring in their manual w/c, can we rig it to have her higher to improve function??  If not, see about renting one from a medical supply company, sit<>stand (to [place] when able), gait as able in // bars.    Cameron Sprang, PT, MPT [facility] [address] [place], [place], [address] Phone: [phone]   Fax:  [phone] 03/13/22, 10:16 AM

## 2022-03-18 ENCOUNTER — Ambulatory Visit: Payer: Medicare PPO | Admitting: Nurse Practitioner

## 2022-03-26 ENCOUNTER — Ambulatory Visit: Payer: Medicare PPO | Admitting: Rehabilitation

## 2022-03-26 ENCOUNTER — Encounter: Payer: Self-pay | Admitting: Rehabilitation

## 2022-03-26 DIAGNOSIS — M6281 Muscle weakness (generalized): Secondary | ICD-10-CM | POA: Diagnosis not present

## 2022-03-26 DIAGNOSIS — R2681 Unsteadiness on feet: Secondary | ICD-10-CM

## 2022-03-26 DIAGNOSIS — S82841P Displaced bimalleolar fracture of right lower leg, subsequent encounter for closed fracture with malunion: Secondary | ICD-10-CM | POA: Diagnosis not present

## 2022-03-26 DIAGNOSIS — R293 Abnormal posture: Secondary | ICD-10-CM | POA: Diagnosis not present

## 2022-03-26 DIAGNOSIS — M25371 Other instability, right ankle: Secondary | ICD-10-CM | POA: Diagnosis not present

## 2022-03-26 DIAGNOSIS — I69354 Hemiplegia and hemiparesis following cerebral infarction affecting left non-dominant side: Secondary | ICD-10-CM | POA: Diagnosis not present

## 2022-03-26 NOTE — Therapy (Signed)
OUTPATIENT PHYSICAL THERAPY NEURO TREATMENT   Patient Name: [name] MRN: [id number] DOB:06-09-1946, 76 y.o., female Today's Date: 03/26/2022   PCP: Sherrie Mustache, NP REFERRING PROVIDER: Sherrie Mustache, NP   PT End of Session - 03/26/22 0935     Visit Number 2    Number of Visits 17    Date for PT Re-Evaluation 05/12/22    Authorization Type [employer] (10th visit PN needed)    Progress Note Due on Visit 10    PT Start Time 0931    PT Stop Time 1017    PT Time Calculation (min) 46 min    Activity Tolerance Patient tolerated treatment well;Patient limited by fatigue    Behavior During Therapy [facility] for tasks assessed/performed             Past Medical History:  Diagnosis Date   Allergy    Arthritis    Broken ankle    Cataract    Diabetes mellitus without complication ([place])    History of colonoscopy    Hypertension    Macular degeneration syndrome    with edema---being treated.    Stage 4 chronic kidney disease ([place])    Stroke ([place]) 2019   Past Surgical History:  Procedure Laterality Date   callous removal  Left 2017   located on 1st digit on L foot 2/2 diabetes   EYE SURGERY     Patient Active Problem List   Diagnosis Date Noted   Ankle fracture 10/22/2020   Bimalleolar ankle fracture, right, closed, initial encounter 10/19/2020   Cataracts, bilateral 05/03/2020   Stage 4 chronic kidney disease ([place]) 12/21/2019   Iron deficiency anemia 02/03/2019   Pelvic fracture ([place]) 09/21/2018   Allergic reaction caused by a drug 05/06/2018   Bilateral leg edema 31/51/7616   Embolic stroke ([place]) 07/37/1062   Left hemiparesis ([place])    Diabetes mellitus type 2 in nonobese ([place])    Acute ischemic stroke ([place])    Left arm weakness    Overweight (BMI 25.0-29.9) 02/24/2018   Diabetes mellitus without complication ([place]) 69/48/5462   HTN (hypertension) 09/02/2017    ONSET DATE: CVA 2020, ankle fracture 2022  REFERRING DIAG: I69.354 (ICD-10-CM) -  Hemiplegia and hemiparesis following cerebral infarction affecting left non-dominant side (HCC) M25.371 (ICD-10-CM) - Instability of right ankle joint S82.841P (ICD-10-CM) - Closed bimalleolar fracture of right ankle with malunion, subsequent encounter R26.81 (ICD-10-CM) - Unstable gait   THERAPY DIAG:  Unsteadiness on feet  Muscle weakness (generalized)  Hemiplegia and hemiparesis following cerebral infarction affecting left non-dominant side (HCC)  Abnormal posture  Rationale for Evaluation and Treatment Rehabilitation  SUBJECTIVE:  SUBJECTIVE STATEMENT: Pt reports no changes from last session, has been practicing sitting unsupported to perform ADL tasks.    Pt accompanied by:  daughter [name]  PERTINENT HISTORY:   PAIN:  Are you having pain? No  PRECAUTIONS: Fall  WEIGHT BEARING RESTRICTIONS No WBAT in R LE   FALLS: Has patient fallen in last 6 months? No  LIVING ENVIRONMENT: Lives with: lives with their family Lives in: House/apartment Stairs: No Has following equipment at home: Environmental consultant - 2 wheeled, Wheelchair (manual), and Ramped entry  PLOF: Independent with basic ADLs and Independent with household mobility without device (before CVA and ankle fracture)  PATIENT GOALS "To get out of this w/c and to stop wearing diapers."     Today's Treatment:    BED MOBILITY:  Sit to supine Min A Supine to sit Min A Continue to work on becoming more independent with bed mobility during session.  Cues for lying to R side (as she does at home) and remaining on side, then rolling supine.  Pt needs min A to guide trunk and tactile cues for pulling LEs up to mat.  Pt able to roll R at S level and then min A with cues to lower LEs off EOM, then how to utilize R UE on mat to get self into sitting.      TRANSFERS: Assistive device utilized: Wheelchair (manual)  Sit to stand: Mod A Stand to sit: Mod A Chair to chair: Mod A (squat pivot) Comments: Had pt transfer to mat via stand pivot at mod/max A level and pt switched her transport chair to standard manual w/c so that arm rest could be removed and could work on squat pivot transfers.  Performed x 4 reps during session with mod A and cues/facilitation for adequate forward/lateral weight shift, cues for head/hip relationship and safe foot placement as well as hand placement to assist.    Therex:   Worked on supine therex, added to HEP, see below for details.      PATIENT EDUCATION: Education details: Education on HEP, continuing to work at [employer] unsupported for ADLs.   Person educated: Patient and Child(ren) Education method: Explanation, Demonstration, and Verbal cues Education comprehension: verbalized understanding and needs further education   HOME EXERCISE PROGRAM: Educated to begin working on ADLs (upper body and some lower body bathing, brushing teeth/hair) seated at EOB with feet supported and S from daughter in order to work on improved trunk activation and upright tolerance/endurance.    Access Code: [email or web address] URL: [email or web address] Date: 03/26/2022 Prepared by: Cameron Sprang  Exercises - Supine Bridge  - 1 x daily - 7 x weekly - 2 sets - 10 reps - Supine Quadricep Sets  - 1 x daily - 7 x weekly - 2 sets - 10 reps - 5 secs hold - Small Range Straight Leg Raise  - 1 x daily - 7 x weekly - 2 sets - 5 reps - Clamshell  - 1 x daily - 7 x weekly - 2 sets - 10 reps - Seated Hamstring Stretch  - 1 x daily - 7 x weekly - 1 sets - 2 reps - 30 secs hold    GOALS: Goals reviewed with patient? Yes  SHORT TERM GOALS: Target date: 04/10/2022  Pt/daughter will be IND with initial HEP in order to indicate improved functional mobility and dec fall risk. Baseline: Goal status: INITIAL  2.  Pt will perform all  aspects of bed mobility at S level in order to indicate improved  independence at home.   Baseline:  Goal status: INITIAL  3.  Pt will perform squat pivot transfers at min A level in order to indicate improved functional mobility.  Baseline:  Goal status: INITIAL  4.  Pt will perform sit<>stand to RW at mod A level in order to indicate improved functional mobility and transfers.  Baseline:  Goal status: INITIAL  5.  Pt will perform stand pivot transfers with RW at mod A level in order to indicate improved functional mobility.   Baseline:  Goal status: INITIAL  6.  Will initiate gait in // bars as able and update LTG as able.  Baseline:  Goal status: INITIAL  7.  Pt will report being able to sit unsupported at home to perform ADLs x 10 mins without LOB.   Baseline:  Goal status: INITIAL  LONG TERM GOALS: Target date: 05/12/2022  Pt/daughter will be IND with final HEP in order to indicate improved functional mobility and dec fall risk. Baseline:  Goal status: INITIAL  2.  Pt will perform all aspects of bed mobility at mod I level in order to indicate improved functional mobility.   Baseline:  Goal status: INITIAL  3.  Pt will perform squat pivot transfers (to/from w/c and bedside commode/shower chair) at S level in order to indicate improved functional independence.  Baseline:  Goal status: INITIAL  4.  Pt will perform sit<>stand with RW at min A level in order to indicate improved functional mobility.   Baseline:  Goal status: INITIAL  5.  Pt will perform stand pivot transfer with RW at min A level in order to indicate improved functional mobility.  Baseline:  Goal status: INITIAL  6.  Will add gait goal as appropriate.  Baseline:  Goal status: INITIAL  ASSESSMENT:  CLINICAL IMPRESSION: Skilled session focused on functional squat pivot transfers from standard w/c during session and supine therex for BLE strengthening.  PT also looked at her standard w/c at end of  session and I actually think height may be good, esp if they add a slightly thicker cushion.  Daughter reports they have several options at home and a wedge to encourage ant pelvic tilt.  Recommended they bring in next session to see if we can use it at height it is now.  Both verbalized understanding.    OBJECTIVE IMPAIRMENTS Abnormal gait, decreased activity tolerance, decreased balance, decreased endurance, decreased mobility, difficulty walking, decreased ROM, decreased strength, hypomobility, impaired perceived functional ability, impaired flexibility, impaired UE functional use, and postural dysfunction.   ACTIVITY LIMITATIONS carrying, lifting, bending, sitting, standing, squatting, stairs, transfers, bed mobility, bathing, toileting, dressing, and locomotion level  PARTICIPATION LIMITATIONS: meal prep, cleaning, laundry, shopping, and community activity  PERSONAL FACTORS Age, Time since onset of injury/illness/exacerbation, and 3+ comorbidities: see above  are also affecting patient's functional outcome.   REHAB POTENTIAL: Good  CLINICAL DECISION MAKING: Evolving/moderate complexity  EVALUATION COMPLEXITY: Moderate  PLAN: PT FREQUENCY: 2x/week  PT DURATION: 8 weeks  PLANNED INTERVENTIONS: Therapeutic exercises, Therapeutic activity, Neuromuscular re-education, Balance training, Gait training, Patient/Family education, Self Care, Joint mobilization, Orthotic/Fit training, DME instructions, Aquatic Therapy, and Wheelchair mobility training  PLAN FOR NEXT SESSION: If they brought in their w/c with cushion, see if she can do squat pivot from this surface.  Add some seated exercises to HEP for core strength, core strength, bed mobility, hip and knee flexibility, continue to work on squat pivot transfers, sit<>stand (to RW when able), gait as able in // bars.  Cameron Sprang, PT, MPT [facility] [address] [place], [place],  [address] Phone: [phone]   Fax:  [phone] 03/26/22, 10:29 AM

## 2022-03-28 ENCOUNTER — Ambulatory Visit: Payer: Medicare PPO | Admitting: Rehabilitation

## 2022-03-28 ENCOUNTER — Encounter: Payer: Self-pay | Admitting: Rehabilitation

## 2022-03-28 DIAGNOSIS — R2681 Unsteadiness on feet: Secondary | ICD-10-CM | POA: Diagnosis not present

## 2022-03-28 DIAGNOSIS — I69354 Hemiplegia and hemiparesis following cerebral infarction affecting left non-dominant side: Secondary | ICD-10-CM

## 2022-03-28 DIAGNOSIS — M6281 Muscle weakness (generalized): Secondary | ICD-10-CM

## 2022-03-28 DIAGNOSIS — M25371 Other instability, right ankle: Secondary | ICD-10-CM | POA: Diagnosis not present

## 2022-03-28 DIAGNOSIS — R293 Abnormal posture: Secondary | ICD-10-CM | POA: Diagnosis not present

## 2022-03-28 DIAGNOSIS — S82841P Displaced bimalleolar fracture of right lower leg, subsequent encounter for closed fracture with malunion: Secondary | ICD-10-CM | POA: Diagnosis not present

## 2022-03-28 NOTE — Therapy (Signed)
OUTPATIENT PHYSICAL THERAPY NEURO TREATMENT   Patient Name: [name] MRN: [id number] DOB:[birth year], 76 y.o., female Today's Date: 03/28/2022   PCP: Sherrie Mustache, NP REFERRING PROVIDER: Sherrie Mustache, NP   PT End of Session - 03/28/22 0936     Visit Number 3    Number of Visits 17    Date for PT Re-Evaluation 05/12/22    Authorization Type [employer] (10th visit PN needed)    Progress Note Due on Visit 10    PT Start Time 0932    PT Stop Time 1015    PT Time Calculation (min) 43 min    Activity Tolerance Patient tolerated treatment well;Patient limited by fatigue    Behavior During Therapy WFL for tasks assessed/performed             Past Medical History:  Diagnosis Date   Allergy    Arthritis    Broken ankle    Cataract    Diabetes mellitus without complication ([place])    History of colonoscopy    Hypertension    Macular degeneration syndrome    with edema---being treated.    Stage 4 chronic kidney disease ([place])    Stroke ([place]) 2019   Past Surgical History:  Procedure Laterality Date   callous removal  Left 2017   located on 1st digit on L foot 2/2 diabetes   EYE SURGERY     Patient Active Problem List   Diagnosis Date Noted   Ankle fracture 10/22/2020   Bimalleolar ankle fracture, right, closed, initial encounter 10/19/2020   Cataracts, bilateral 05/03/2020   Stage 4 chronic kidney disease ([place]) 12/21/2019   Iron deficiency anemia 02/03/2019   Pelvic fracture ([place]) 09/21/2018   Allergic reaction caused by a drug 05/06/2018   Bilateral leg edema 53/97/6734   Embolic stroke ([place]) 19/37/9024   Left hemiparesis ([place])    Diabetes mellitus type 2 in nonobese ([place])    Acute ischemic stroke ([place])    Left arm weakness    Overweight (BMI 25.0-29.9) 02/24/2018   Diabetes mellitus without complication ([place]) 09/73/5329   HTN (hypertension) 09/02/2017    ONSET DATE: CVA 2020, ankle fracture 2022  REFERRING DIAG: I69.354 (ICD-10-CM) -  Hemiplegia and hemiparesis following cerebral infarction affecting left non-dominant side (HCC) M25.371 (ICD-10-CM) - Instability of right ankle joint S82.841P (ICD-10-CM) - Closed bimalleolar fracture of right ankle with malunion, subsequent encounter R26.81 (ICD-10-CM) - Unstable gait   THERAPY DIAG:  Unsteadiness on feet  Muscle weakness (generalized)  Hemiplegia and hemiparesis following cerebral infarction affecting left non-dominant side (HCC)  Abnormal posture  Rationale for Evaluation and Treatment Rehabilitation  SUBJECTIVE:  SUBJECTIVE STATEMENT: Pt reports no changes from last session, has been practicing sitting unsupported to perform ADL tasks.    Pt accompanied by:  daughter [name]  PERTINENT HISTORY:   PAIN:  Are you having pain? No  PRECAUTIONS: Fall  WEIGHT BEARING RESTRICTIONS No WBAT in R LE   FALLS: Has patient fallen in last 6 months? No  LIVING ENVIRONMENT: Lives with: lives with their family Lives in: House/apartment Stairs: No Has following equipment at home: Environmental consultant - 2 wheeled, Wheelchair (manual), and Ramped entry  PLOF: Independent with basic ADLs and Independent with household mobility without device (before CVA and ankle fracture)  PATIENT GOALS "To get out of this w/c and to stop wearing diapers."     Today's Treatment:   TRANSFERS: Assistive device utilized: Wheelchair (manual)  Sit to stand: Mod A and Max A Stand to sit: Mod A Chair to chair: Min A and Mod A (squat pivot) Comments: Pts daughter brought in their standard manual w/c today with several cushion options to determine which would be most appropriate.  Performed stand pivot transfer to mat from transport chair at mod A level.  Performed x 4 reps during session with mod A fading to min A and  cues/facilitation for adequate forward/lateral weight shift, cues for head/hip relationship and safe foot placement as well as hand placement to assist.    While on mat, performing scooting R and L x 3 reps each, again to address improving forward weight shift and WB through LEs when scooting to carryover to transfers.  She does better moving R vs L, pt seems to be limited more by perceptual deficits rather than strength.      Ended session with sit<>stand transfers in // bars (3 reps).  Pt seems to have more difficulty this session than on eval, due to fatigue.  Mod to max A with cues for forward weight shift.  She initially utilized BUEs, however with LUE on bar, she tends to flex at trunk and rotate towards L UE so had her use R UE only when coming to stand for last rep.  Tactile and verbal cues at chest and pelvis for more upright posture.  Last stand was most successful, however she is only able to stand x 30 secs max.    PT/pt and daughter felt that her w/c is good to begin doing these transfers at home with wedged cushion that is slightly firm but puts her pelvis in optimal position.  Educated to start slow to ensure she tolerates cushion and can pressure relief if needed.  Pt and daughter verbalize understanding.    PATIENT EDUCATION: Education details: continue with HEP, continuing to work at EOB unsupported for ADLs.   Person educated: Patient and Child(ren) Education method: Explanation, Demonstration, and Verbal cues Education comprehension: verbalized understanding and needs further education   HOME EXERCISE PROGRAM: Educated to begin working on ADLs (upper body and some lower body bathing, brushing teeth/hair) seated at EOB with feet supported and S from daughter in order to work on improved trunk activation and upright tolerance/endurance.    Access Code: [email or web address] URL: [email or web address] Date: 03/26/2022 Prepared by: Cameron Sprang  Exercises - Supine Bridge   - 1 x daily - 7 x weekly - 2 sets - 10 reps - Supine Quadricep Sets  - 1 x daily - 7 x weekly - 2 sets - 10 reps - 5 secs hold - Small Range Straight Leg Raise  - 1 x daily - 7 x weekly -  2 sets - 5 reps - Clamshell  - 1 x daily - 7 x weekly - 2 sets - 10 reps - Seated Hamstring Stretch  - 1 x daily - 7 x weekly - 1 sets - 2 reps - 30 secs hold    GOALS: Goals reviewed with patient? Yes  SHORT TERM GOALS: Target date: 04/10/2022  Pt/daughter will be IND with initial HEP in order to indicate improved functional mobility and dec fall risk. Baseline: Goal status: INITIAL  2.  Pt will perform all aspects of bed mobility at S level in order to indicate improved independence at home.   Baseline:  Goal status: INITIAL  3.  Pt will perform squat pivot transfers at min A level in order to indicate improved functional mobility.  Baseline:  Goal status: INITIAL  4.  Pt will perform sit<>stand to RW at mod A level in order to indicate improved functional mobility and transfers.  Baseline:  Goal status: INITIAL  5.  Pt will perform stand pivot transfers with RW at mod A level in order to indicate improved functional mobility.   Baseline:  Goal status: INITIAL  6.  Will initiate gait in // bars as able and update LTG as able.  Baseline:  Goal status: INITIAL  7.  Pt will report being able to sit unsupported at home to perform ADLs x 10 mins without LOB.   Baseline:  Goal status: INITIAL  LONG TERM GOALS: Target date: 05/12/2022  Pt/daughter will be IND with final HEP in order to indicate improved functional mobility and dec fall risk. Baseline:  Goal status: INITIAL  2.  Pt will perform all aspects of bed mobility at mod I level in order to indicate improved functional mobility.   Baseline:  Goal status: INITIAL  3.  Pt will perform squat pivot transfers (to/from w/c and bedside commode/shower chair) at S level in order to indicate improved functional independence.  Baseline:   Goal status: INITIAL  4.  Pt will perform sit<>stand with RW at min A level in order to indicate improved functional mobility.   Baseline:  Goal status: INITIAL  5.  Pt will perform stand pivot transfer with RW at min A level in order to indicate improved functional mobility.  Baseline:  Goal status: INITIAL  6.  Will add gait goal as appropriate.  Baseline:  Goal status: INITIAL  ASSESSMENT:  CLINICAL IMPRESSION: Skilled session continues to focus on functional squat pivot transfers from standard w/c, along with scooting on mat to translate to improved transfers.  Pt is able to progress from mod to min A during session.  Performed a few reps of standing in // bars at end of session however pt needing more mod/max A and unable to get fully upright during trials.     OBJECTIVE IMPAIRMENTS Abnormal gait, decreased activity tolerance, decreased balance, decreased endurance, decreased mobility, difficulty walking, decreased ROM, decreased strength, hypomobility, impaired perceived functional ability, impaired flexibility, impaired UE functional use, and postural dysfunction.   ACTIVITY LIMITATIONS carrying, lifting, bending, sitting, standing, squatting, stairs, transfers, bed mobility, bathing, toileting, dressing, and locomotion level  PARTICIPATION LIMITATIONS: meal prep, cleaning, laundry, shopping, and community activity  PERSONAL FACTORS Age, Time since onset of injury/illness/exacerbation, and 3+ comorbidities: see above  are also affecting patient's functional outcome.   REHAB POTENTIAL: Good  CLINICAL DECISION MAKING: Evolving/moderate complexity  EVALUATION COMPLEXITY: Moderate  PLAN: PT FREQUENCY: 2x/week  PT DURATION: 8 weeks  PLANNED INTERVENTIONS: Therapeutic exercises, Therapeutic activity, Neuromuscular re-education,  Balance training, Gait training, Patient/Family education, Self Care, Joint mobilization, Orthotic/Fit training, DME instructions, Aquatic Therapy,  and Wheelchair mobility training  PLAN FOR NEXT SESSION: Continue squat pivot transfers,  Add some seated exercises to HEP for core strength, core strength (could do some sitting on rockerboard/BOSU), bed mobility, hip and knee flexibility, continue to work on squat pivot transfers, sit<>stand (to RW when able), gait as able in // bars.    Cameron Sprang, PT, MPT [facility] [address] [place], [place], [address] Phone: [phone]   Fax:  [phone] 03/28/22, 11:55 AM

## 2022-04-02 ENCOUNTER — Ambulatory Visit: Payer: Medicare PPO | Admitting: Rehabilitation

## 2022-04-02 ENCOUNTER — Encounter: Payer: Self-pay | Admitting: Rehabilitation

## 2022-04-02 DIAGNOSIS — M6281 Muscle weakness (generalized): Secondary | ICD-10-CM

## 2022-04-02 DIAGNOSIS — R2681 Unsteadiness on feet: Secondary | ICD-10-CM

## 2022-04-02 DIAGNOSIS — S82841P Displaced bimalleolar fracture of right lower leg, subsequent encounter for closed fracture with malunion: Secondary | ICD-10-CM | POA: Diagnosis not present

## 2022-04-02 DIAGNOSIS — I69354 Hemiplegia and hemiparesis following cerebral infarction affecting left non-dominant side: Secondary | ICD-10-CM

## 2022-04-02 DIAGNOSIS — R293 Abnormal posture: Secondary | ICD-10-CM | POA: Diagnosis not present

## 2022-04-02 DIAGNOSIS — M25371 Other instability, right ankle: Secondary | ICD-10-CM | POA: Diagnosis not present

## 2022-04-02 NOTE — Therapy (Signed)
OUTPATIENT PHYSICAL THERAPY NEURO TREATMENT   Patient Name: [name] MRN: [id number] DOB:24-Nov-1945, 76 y.o., female Today's Date: 04/02/2022   PCP: Sherrie Mustache, NP REFERRING PROVIDER: Sherrie Mustache, NP   PT End of Session - 04/02/22 1019     Visit Number 4    Number of Visits 17    Date for PT Re-Evaluation 05/12/22    Authorization Type [employer] (10th visit PN needed)    Progress Note Due on Visit 10    PT Start Time 0932    PT Stop Time 1016    PT Time Calculation (min) 44 min    Activity Tolerance Patient tolerated treatment well;Patient limited by fatigue    Behavior During Therapy WFL for tasks assessed/performed             Past Medical History:  Diagnosis Date   Allergy    Arthritis    Broken ankle    Cataract    Diabetes mellitus without complication ([place])    History of colonoscopy    Hypertension    Macular degeneration syndrome    with edema---being treated.    Stage 4 chronic kidney disease ([place])    Stroke ([place]) 2019   Past Surgical History:  Procedure Laterality Date   callous removal  Left 2017   located on 1st digit on L foot 2/2 diabetes   EYE SURGERY     Patient Active Problem List   Diagnosis Date Noted   Ankle fracture 10/22/2020   Bimalleolar ankle fracture, right, closed, initial encounter 10/19/2020   Cataracts, bilateral 05/03/2020   Stage 4 chronic kidney disease ([place]) 12/21/2019   Iron deficiency anemia 02/03/2019   Pelvic fracture ([place]) 09/21/2018   Allergic reaction caused by a drug 05/06/2018   Bilateral leg edema 88/41/6606   Embolic stroke ([place]) 30/16/0109   Left hemiparesis ([place])    Diabetes mellitus type 2 in nonobese ([place])    Acute ischemic stroke ([place])    Left arm weakness    Overweight (BMI 25.0-29.9) 02/24/2018   Diabetes mellitus without complication ([place]) 32/35/5732   HTN (hypertension) 09/02/2017    ONSET DATE: CVA 2020, ankle fracture 2022  REFERRING DIAG: I69.354 (ICD-10-CM) -  Hemiplegia and hemiparesis following cerebral infarction affecting left non-dominant side (HCC) M25.371 (ICD-10-CM) - Instability of right ankle joint S82.841P (ICD-10-CM) - Closed bimalleolar fracture of right ankle with malunion, subsequent encounter R26.81 (ICD-10-CM) - Unstable gait   THERAPY DIAG:  Unsteadiness on feet  Muscle weakness (generalized)  Hemiplegia and hemiparesis following cerebral infarction affecting left non-dominant side (HCC)  Abnormal posture  Rationale for Evaluation and Treatment Rehabilitation  SUBJECTIVE:  SUBJECTIVE STATEMENT: Pt reports no changes from last session, has been practicing sitting unsupported to perform ADL tasks.    Pt accompanied by:  daughter [name]  PERTINENT HISTORY:   PAIN:  Are you having pain? No  PRECAUTIONS: Fall  WEIGHT BEARING RESTRICTIONS No WBAT in R LE   FALLS: Has patient fallen in last 6 months? No  LIVING ENVIRONMENT: Lives with: lives with their family Lives in: House/apartment Stairs: No Has following equipment at home: Environmental consultant - 2 wheeled, Wheelchair (manual), and Ramped entry  PLOF: Independent with basic ADLs and Independent with household mobility without device (before CVA and ankle fracture)  PATIENT GOALS "To get out of this w/c and to stop wearing diapers."     Today's Treatment:   TRANSFERS: Assistive device utilized: Wheelchair (manual)  Sit to stand: Mod A and Max A Stand to sit: Mod A Chair to chair: Min A and Mod A (squat pivot) Comments: Pt in her standard manual w/c today with soft cushion.  Performed squat pivot transfer to mat from transport chair at mod A level.  Performed x 3 reps during session (to mat or nustep) with mod A and cues/facilitation for adequate forward/lateral weight shift, cues for  head/hip relationship and safe foot placement as well as hand placement to assist.    While on mat, had her sit on small rockerboard (daughter assisted with placement) with feet supported.  PT on pts R with her hand on PTs hand with cues to "keep your hand on my hand" while shifting R x 5 reps.  PT moved to pts R side fully to better facilitate trunk elongation on R side and shortening on L side.  Continued cues and facilitation for lifting chest and improved anterior pelvic tilt.      While at // bars, had pt perform standing x 4 reps with RUE support (to simulate how they would perform at home).  Pt fluctuates between max A and mod a throughout with cues for inhale with stand for improved postural control and hip extension activation.  Also utiltized loud/fast cuing for more chest breathing during tasks.   Therex: Nustep x 5 mins at level 2 resistance with BLEs and RUE with cues for keeping pace in 60's throughout.       PATIENT EDUCATION: Education details: Educated to keep her in transport chair when daughter at work so she can self propel but then can work on "therapy" transfers once daughter home.    Person educated: Patient and Child(ren) Education method: Explanation, Demonstration, and Verbal cues Education comprehension: verbalized understanding and needs further education   HOME EXERCISE PROGRAM: Educated to begin working on ADLs (upper body and some lower body bathing, brushing teeth/hair) seated at EOB with feet supported and S from daughter in order to work on improved trunk activation and upright tolerance/endurance.    Access Code: [email or web address] URL: [email or web address] Date: 03/26/2022 Prepared by: Cameron Sprang  Exercises - Supine Bridge  - 1 x daily - 7 x weekly - 2 sets - 10 reps - Supine Quadricep Sets  - 1 x daily - 7 x weekly - 2 sets - 10 reps - 5 secs hold - Small Range Straight Leg Raise  - 1 x daily - 7 x weekly - 2 sets - 5 reps - Clamshell  - 1 x  daily - 7 x weekly - 2 sets - 10 reps - Seated Hamstring Stretch  - 1 x daily - 7 x weekly - 1 sets - 2  reps - 30 secs hold    GOALS: Goals reviewed with patient? Yes  SHORT TERM GOALS: Target date: 04/10/2022  Pt/daughter will be IND with initial HEP in order to indicate improved functional mobility and dec fall risk. Baseline: Goal status: INITIAL  2.  Pt will perform all aspects of bed mobility at S level in order to indicate improved independence at home.   Baseline:  Goal status: INITIAL  3.  Pt will perform squat pivot transfers at min A level in order to indicate improved functional mobility.  Baseline:  Goal status: INITIAL  4.  Pt will perform sit<>stand to RW at mod A level in order to indicate improved functional mobility and transfers.  Baseline:  Goal status: INITIAL  5.  Pt will perform stand pivot transfers with RW at mod A level in order to indicate improved functional mobility.   Baseline:  Goal status: INITIAL  6.  Will initiate gait in // bars as able and update LTG as able.  Baseline:  Goal status: INITIAL  7.  Pt will report being able to sit unsupported at home to perform ADLs x 10 mins without LOB.   Baseline:  Goal status: INITIAL  LONG TERM GOALS: Target date: 05/12/2022  Pt/daughter will be IND with final HEP in order to indicate improved functional mobility and dec fall risk. Baseline:  Goal status: INITIAL  2.  Pt will perform all aspects of bed mobility at mod I level in order to indicate improved functional mobility.   Baseline:  Goal status: INITIAL  3.  Pt will perform squat pivot transfers (to/from w/c and bedside commode/shower chair) at S level in order to indicate improved functional independence.  Baseline:  Goal status: INITIAL  4.  Pt will perform sit<>stand with RW at min A level in order to indicate improved functional mobility.   Baseline:  Goal status: INITIAL  5.  Pt will perform stand pivot transfer with RW at min A  level in order to indicate improved functional mobility.  Baseline:  Goal status: INITIAL  6.  Will add gait goal as appropriate.  Baseline:  Goal status: INITIAL  ASSESSMENT:  CLINICAL IMPRESSION: Pts daughter reports frustration with trying to do tasks from her manual w/c.  Therefore session focused on sit<>stands using // bars in front to simulate her bar by bed at home.  Pts fluctuates on amounts of assist needed, but is able to do at mod A when PT on R side to assist in keeping weight on RLE.  Also utilized breathing techniques to best stimulate chest breathing and posture for improved mobility.       OBJECTIVE IMPAIRMENTS Abnormal gait, decreased activity tolerance, decreased balance, decreased endurance, decreased mobility, difficulty walking, decreased ROM, decreased strength, hypomobility, impaired perceived functional ability, impaired flexibility, impaired UE functional use, and postural dysfunction.   ACTIVITY LIMITATIONS carrying, lifting, bending, sitting, standing, squatting, stairs, transfers, bed mobility, bathing, toileting, dressing, and locomotion level  PARTICIPATION LIMITATIONS: meal prep, cleaning, laundry, shopping, and community activity  PERSONAL FACTORS Age, Time since onset of injury/illness/exacerbation, and 3+ comorbidities: see above  are also affecting patient's functional outcome.   REHAB POTENTIAL: Good  CLINICAL DECISION MAKING: Evolving/moderate complexity  EVALUATION COMPLEXITY: Moderate  PLAN: PT FREQUENCY: 2x/week  PT DURATION: 8 weeks  PLANNED INTERVENTIONS: Therapeutic exercises, Therapeutic activity, Neuromuscular re-education, Balance training, Gait training, Patient/Family education, Self Care, Joint mobilization, Orthotic/Fit training, DME instructions, Aquatic Therapy, and Wheelchair mobility training  PLAN FOR NEXT SESSION: Continue squat  pivot transfers,  perceptual deficits (work on improving R weight shift).  Add some seated  exercises to HEP for core strength, core strength (could do some sitting on rockerboard/BOSU), bed mobility, hip and knee flexibility, continue to work on squat pivot transfers, sit<>stand (to RW when able), gait as able in // bars.    Cameron Sprang, PT, MPT [facility] [address] [place], [place], [address] Phone: [phone]   Fax:  [phone] 04/02/22, 10:21 AM

## 2022-04-03 DIAGNOSIS — R293 Abnormal posture: Secondary | ICD-10-CM | POA: Diagnosis not present

## 2022-04-03 DIAGNOSIS — M5459 Other low back pain: Secondary | ICD-10-CM | POA: Diagnosis not present

## 2022-04-03 DIAGNOSIS — M9903 Segmental and somatic dysfunction of lumbar region: Secondary | ICD-10-CM | POA: Diagnosis not present

## 2022-04-03 DIAGNOSIS — M9901 Segmental and somatic dysfunction of cervical region: Secondary | ICD-10-CM | POA: Diagnosis not present

## 2022-04-03 DIAGNOSIS — M9902 Segmental and somatic dysfunction of thoracic region: Secondary | ICD-10-CM | POA: Diagnosis not present

## 2022-04-04 ENCOUNTER — Encounter: Payer: Self-pay | Admitting: Rehabilitation

## 2022-04-04 ENCOUNTER — Ambulatory Visit: Payer: Medicare PPO | Admitting: Rehabilitation

## 2022-04-04 DIAGNOSIS — M6281 Muscle weakness (generalized): Secondary | ICD-10-CM

## 2022-04-04 DIAGNOSIS — I69354 Hemiplegia and hemiparesis following cerebral infarction affecting left non-dominant side: Secondary | ICD-10-CM

## 2022-04-04 DIAGNOSIS — M25371 Other instability, right ankle: Secondary | ICD-10-CM | POA: Diagnosis not present

## 2022-04-04 DIAGNOSIS — R293 Abnormal posture: Secondary | ICD-10-CM

## 2022-04-04 DIAGNOSIS — S82841P Displaced bimalleolar fracture of right lower leg, subsequent encounter for closed fracture with malunion: Secondary | ICD-10-CM | POA: Diagnosis not present

## 2022-04-04 DIAGNOSIS — R2681 Unsteadiness on feet: Secondary | ICD-10-CM | POA: Diagnosis not present

## 2022-04-04 NOTE — Therapy (Signed)
OUTPATIENT PHYSICAL THERAPY NEURO TREATMENT   Patient Name: [name] MRN: [id number] DOB:July 17, 1945, 76 y.o., female Today's Date: 04/04/2022   PCP: [name] Mustache, NP REFERRING PROVIDER: Sherrie Mustache, NP   PT End of Session - 04/04/22 0931     Visit Number 5    Number of Visits 17    Date for PT Re-Evaluation 05/12/22    Authorization Type [employer] (10th visit PN needed)    Progress Note Due on Visit 10    PT Start Time 0930    PT Stop Time 1016    PT Time Calculation (min) 46 min    Activity Tolerance Patient tolerated treatment well;Patient limited by fatigue    Behavior During Therapy WFL for tasks assessed/performed             Past Medical History:  Diagnosis Date   Allergy    Arthritis    Broken ankle    Cataract    Diabetes mellitus without complication ([place])    History of colonoscopy    Hypertension    Macular degeneration syndrome    with edema---being treated.    Stage 4 chronic kidney disease ([place])    Stroke ([place]) 2019   Past Surgical History:  Procedure Laterality Date   callous removal  Left 2017   located on 1st digit on L foot 2/2 diabetes   EYE SURGERY     Patient Active Problem List   Diagnosis Date Noted   Ankle fracture 10/22/2020   Bimalleolar ankle fracture, right, closed, initial encounter 10/19/2020   Cataracts, bilateral 05/03/2020   Stage 4 chronic kidney disease ([place]) 12/21/2019   Iron deficiency anemia 02/03/2019   Pelvic fracture ([place]) 09/21/2018   Allergic reaction caused by a drug 05/06/2018   Bilateral leg edema 40/98/1191   Embolic stroke ([place]) 47/82/9562   Left hemiparesis ([place])    Diabetes mellitus type 2 in nonobese ([place])    Acute ischemic stroke ([place])    Left arm weakness    Overweight (BMI 25.0-29.9) 02/24/2018   Diabetes mellitus without complication ([place]) 13/02/6577   HTN (hypertension) 09/02/2017    ONSET DATE: CVA 2020, ankle fracture 2022  REFERRING DIAG: I69.354 (ICD-10-CM) -  Hemiplegia and hemiparesis following cerebral infarction affecting left non-dominant side (HCC) M25.371 (ICD-10-CM) - Instability of right ankle joint S82.841P (ICD-10-CM) - Closed bimalleolar fracture of right ankle with malunion, subsequent encounter R26.81 (ICD-10-CM) - Unstable gait   THERAPY DIAG:  Unsteadiness on feet  Muscle weakness (generalized)  Hemiplegia and hemiparesis following cerebral infarction affecting left non-dominant side (HCC)  Abnormal posture  Rationale for Evaluation and Treatment Rehabilitation  SUBJECTIVE:  SUBJECTIVE STATEMENT: Pt reports no changes from last session, they are in transport chair today as this is easier to get in/out of car.   Pt accompanied by:  daughter [name]  PERTINENT HISTORY:   PAIN:  Are you having pain? No  PRECAUTIONS: Fall  WEIGHT BEARING RESTRICTIONS No WBAT in R LE   FALLS: Has patient fallen in last 6 months? No  LIVING ENVIRONMENT: Lives with: lives with their family Lives in: House/apartment Stairs: No Has following equipment at home: Environmental consultant - 2 wheeled, Wheelchair (manual), and Ramped entry  PLOF: Independent with basic ADLs and Independent with household mobility without device (before CVA and ankle fracture)  PATIENT GOALS "To get out of this w/c and to stop wearing diapers."     Today's Treatment:   TRANSFERS: Assistive device utilized: Wheelchair (manual)  Sit to stand: Mod A and Max A Stand to sit: Mod A Chair to chair: Max A (stand/squat pivot) Comments: Pt in her transport w/c today with soft cushion.  Performed stand pivot transfer to mat from transport chair at max A level.  Performed x 2 reps during session with max A and cues/facilitation for adequate forward/lateral weight shift, cues for keeping head up and  safe foot placement as well as hand placement to assist.  She continues to demonstrate perceptual deficits during transfers noted today with increased L lateral lean, causing R foot to "curl" under her during transfer.  PT needing to provide max A today.   While on mat, Performed lateral scooting 3x to the R and 2x to the L with emphasis on improving forward weight shift and "push" through LEs to lift buttocks.  Had pt place R hand in lap to avoid pusher tendencies.    Performed sit<>supine today at light min A level with min cues for technique. While in hooklying, performing lower trunk rotation x 10 reps with light overpressure from PT.    [name] today for improved weight bearing and postural control.  Mod/max A to stand fully to place pads under buttocks.  Cues for upright posture even when in "rest" position.  Performed 3 reps of fully standing, with cues to "push through feet" for upright posture.  Attempted to have pt stand and utilize R UE to grasp clothes pins and place on pole to the R to encourage posture and trunk lengthening as well as decreasing pusher tendencies.  Pt fatigues very quickly, only able to give short bursts of upright posture before "sitting" back to pads.            PATIENT EDUCATION: Education details: Educated to keep her in transport chair when daughter at work so she can self propel but then can work on "therapy" transfers once daughter home.    Person educated: Patient and Child(ren) Education method: Explanation, Demonstration, and Verbal cues Education comprehension: verbalized understanding and needs further education   HOME EXERCISE PROGRAM: Educated to begin working on ADLs (upper body and some lower body bathing, brushing teeth/hair) seated at EOB with feet supported and S from daughter in order to work on improved trunk activation and upright tolerance/endurance.    Access Code: [email or web address] URL: [email or web address] Date:  03/26/2022 Prepared by: [name]  Exercises - Supine Bridge  - 1 x daily - 7 x weekly - 2 sets - 10 reps - Supine Quadricep Sets  - 1 x daily - 7 x weekly - 2 sets - 10 reps - 5 secs hold - Small Range Straight Leg Raise  -  1 x daily - 7 x weekly - 2 sets - 5 reps - Clamshell  - 1 x daily - 7 x weekly - 2 sets - 10 reps - Seated Hamstring Stretch  - 1 x daily - 7 x weekly - 1 sets - 2 reps - 30 secs hold    GOALS: Goals reviewed with patient? Yes  SHORT TERM GOALS: Target date: 04/10/2022  Pt/daughter will be IND with initial HEP in order to indicate improved functional mobility and dec fall risk. Baseline: Goal status: INITIAL  2.  Pt will perform all aspects of bed mobility at S level in order to indicate improved independence at home.   Baseline:  Goal status: INITIAL  3.  Pt will perform squat pivot transfers at min A level in order to indicate improved functional mobility.  Baseline:  Goal status: INITIAL  4.  Pt will perform sit<>stand to RW at mod A level in order to indicate improved functional mobility and transfers.  Baseline:  Goal status: INITIAL  5.  Pt will perform stand pivot transfers with RW at mod A level in order to indicate improved functional mobility.   Baseline:  Goal status: INITIAL  6.  Will initiate gait in // bars as able and update LTG as able.  Baseline:  Goal status: INITIAL  7.  Pt will report being able to sit unsupported at home to perform ADLs x 10 mins without LOB.   Baseline:  Goal status: INITIAL  LONG TERM GOALS: Target date: 05/12/2022  Pt/daughter will be IND with final HEP in order to indicate improved functional mobility and dec fall risk. Baseline:  Goal status: INITIAL  2.  Pt will perform all aspects of bed mobility at mod I level in order to indicate improved functional mobility.   Baseline:  Goal status: INITIAL  3.  Pt will perform squat pivot transfers (to/from w/c and bedside commode/shower chair) at S level  in order to indicate improved functional independence.  Baseline:  Goal status: INITIAL  4.  Pt will perform sit<>stand with RW at min A level in order to indicate improved functional mobility.   Baseline:  Goal status: INITIAL  5.  Pt will perform stand pivot transfer with RW at min A level in order to indicate improved functional mobility.  Baseline:  Goal status: INITIAL  6.  Will add gait goal as appropriate.  Baseline:  Goal status: INITIAL  ASSESSMENT:  CLINICAL IMPRESSION: Skilled session focused on functional transfers, scooting, and utilizing stedy for LE WB, postural control, midline orientation and to address perceptual deficits.  Pt tolerated well and demonstrates good "bursts" of active movement, but fatigues quickly.      OBJECTIVE IMPAIRMENTS Abnormal gait, decreased activity tolerance, decreased balance, decreased endurance, decreased mobility, difficulty walking, decreased ROM, decreased strength, hypomobility, impaired perceived functional ability, impaired flexibility, impaired UE functional use, and postural dysfunction.   ACTIVITY LIMITATIONS carrying, lifting, bending, sitting, standing, squatting, stairs, transfers, bed mobility, bathing, toileting, dressing, and locomotion level  PARTICIPATION LIMITATIONS: meal prep, cleaning, laundry, shopping, and community activity  PERSONAL FACTORS Age, Time since onset of injury/illness/exacerbation, and 3+ comorbidities: see above  are also affecting patient's functional outcome.   REHAB POTENTIAL: Good  CLINICAL DECISION MAKING: Evolving/moderate complexity  EVALUATION COMPLEXITY: Moderate  PLAN: PT FREQUENCY: 2x/week  PT DURATION: 8 weeks  PLANNED INTERVENTIONS: Therapeutic exercises, Therapeutic activity, Neuromuscular re-education, Balance training, Gait training, Patient/Family education, Self Care, Joint mobilization, Orthotic/Fit training, DME instructions, Aquatic Therapy, and Wheelchair mobility  training  PLAN FOR NEXT SESSION: Continue squat pivot transfers (when they have manual chair or can get clinic w/c),  perceptual deficits (work on improving R weight shift).  Add some seated exercises to HEP for core strength, core strength (could do some sitting on rockerboard/BOSU), bed mobility, hip and knee flexibility, trunk dissociation, sit<>stand (to RW when able), gait as able in // bars.    [name], PT, MPT [facility] [address] [place], [place], [address] Phone: [phone]   Fax:  [phone] 04/04/22, 10:17 AM

## 2022-04-09 ENCOUNTER — Ambulatory Visit: Payer: Medicare PPO | Admitting: Rehabilitation

## 2022-04-11 ENCOUNTER — Ambulatory Visit: Payer: Medicare PPO | Admitting: Rehabilitation

## 2022-04-16 ENCOUNTER — Ambulatory Visit: Payer: Medicare PPO | Attending: Nurse Practitioner | Admitting: Rehabilitation

## 2022-04-16 ENCOUNTER — Encounter: Payer: Self-pay | Admitting: Rehabilitation

## 2022-04-16 DIAGNOSIS — R2681 Unsteadiness on feet: Secondary | ICD-10-CM | POA: Insufficient documentation

## 2022-04-16 DIAGNOSIS — R293 Abnormal posture: Secondary | ICD-10-CM | POA: Diagnosis not present

## 2022-04-16 DIAGNOSIS — M6281 Muscle weakness (generalized): Secondary | ICD-10-CM | POA: Insufficient documentation

## 2022-04-16 DIAGNOSIS — I69354 Hemiplegia and hemiparesis following cerebral infarction affecting left non-dominant side: Secondary | ICD-10-CM | POA: Diagnosis not present

## 2022-04-16 NOTE — Therapy (Signed)
OUTPATIENT PHYSICAL THERAPY NEURO TREATMENT   Patient Name: [name] MRN: [id number] DOB:03/27/1946, 76 y.o., female Today's Date: 04/16/2022   PCP: Sherrie Mustache, NP REFERRING PROVIDER: Sherrie Mustache, NP   PT End of Session - 04/16/22 0844     Visit Number 6    Number of Visits 17    Date for PT Re-Evaluation 05/12/22    Authorization Type [employer] Medicare (10th visit PN needed)    Progress Note Due on Visit 10    PT Start Time 0847    PT Stop Time 0930    PT Time Calculation (min) 43 min    Activity Tolerance Patient tolerated treatment well;Patient limited by fatigue    Behavior During Therapy [facility] for tasks assessed/performed             Past Medical History:  Diagnosis Date   Allergy    Arthritis    Broken ankle    Cataract    Diabetes mellitus without complication ([place])    History of colonoscopy    Hypertension    Macular degeneration syndrome    with edema---being treated.    Stage 4 chronic kidney disease ([place])    Stroke ([place]) 2019   Past Surgical History:  Procedure Laterality Date   callous removal  Left 2017   located on 1st digit on L foot 2/2 diabetes   EYE SURGERY     Patient Active Problem List   Diagnosis Date Noted   Ankle fracture 10/22/2020   Bimalleolar ankle fracture, right, closed, initial encounter 10/19/2020   Cataracts, bilateral 05/03/2020   Stage 4 chronic kidney disease ([place]) 12/21/2019   Iron deficiency anemia 02/03/2019   Pelvic fracture ([place]) 09/21/2018   Allergic reaction caused by a drug 05/06/2018   Bilateral leg edema 13/24/4010   Embolic stroke ([place]) 27/25/3664   Left hemiparesis ([place])    Diabetes mellitus type 2 in nonobese ([place])    Acute ischemic stroke ([place])    Left arm weakness    Overweight (BMI 25.0-29.9) 02/24/2018   Diabetes mellitus without complication ([place]) 40/34/7425   HTN (hypertension) 09/02/2017    ONSET DATE: CVA 2020, ankle fracture 2022  REFERRING DIAG: I69.354 (ICD-10-CM) -  Hemiplegia and hemiparesis following cerebral infarction affecting left non-dominant side (HCC) M25.371 (ICD-10-CM) - Instability of right ankle joint S82.841P (ICD-10-CM) - Closed bimalleolar fracture of right ankle with malunion, subsequent encounter R26.81 (ICD-10-CM) - Unstable gait   THERAPY DIAG:  Unsteadiness on feet  Muscle weakness (generalized)  Hemiplegia and hemiparesis following cerebral infarction affecting left non-dominant side (HCC)  Abnormal posture  Rationale for Evaluation and Treatment Rehabilitation  SUBJECTIVE:  SUBJECTIVE STATEMENT: Pt reports no changes from last session, they are in transport chair today as this is easier to get in/out of car.  Went to [place] last week and had a great trip!  Pt accompanied by:  daughter [name]  PERTINENT HISTORY:   PAIN:  Are you having pain? No  PRECAUTIONS: Fall  WEIGHT BEARING RESTRICTIONS No WBAT in R LE   FALLS: Has patient fallen in last 6 months? No  LIVING ENVIRONMENT: Lives with: lives with their family Lives in: House/apartment Stairs: No Has following equipment at home: Environmental consultant - 2 wheeled, Wheelchair (manual), and Ramped entry  PLOF: Independent with basic ADLs and Independent with household mobility without device (before CVA and ankle fracture)  PATIENT GOALS "To get out of this w/c and to stop wearing diapers."     Today's Treatment:   TRANSFERS: Assistive device utilized: Wheelchair (manual)  Sit to stand: Mod A and Max A Stand to sit: Mod A Chair to chair: Max A (stand pivot) Comments: Pt in her transport w/c today with soft cushion.  Performed stand pivot transfer to mat from transport chair at mod/max A level.  Performed x 2 reps during session with mod/max A and cues/facilitation for adequate  forward/lateral weight shift, cues for keeping head up and safe foot placement as well as hand placement to assist.  Facilitation for lateral weight shifting to advance LEs during transfers, however this continues to be difficult.    Performed sit>SL>supine today at S level.  She is able to roll to R side at S level and get LEs off of mat, only needing min A to initiate trunk elevation from mat and cues for R hand placement to self assist into sitting.   In // bars, performed sit<>stand x 2 reps with RUE helping with stand.  Pt did very well initiated and performing more upright stand today with cues for lifting chest and "pushing" through LEs to stand taller.  Performed 4' then 4 steps during session with PT providing assist at pelvis for improved hip extension and also at knees (stance knee) to ensure stability and improved weight shift, esp on RLE.  She has most difficult shifting R (likely perceptual) causing LLE advancement to be difficult.    Then moved to sink to perform sit<>stand to determine if she and daughter can do safely at home.  Performed x 3 reps with cues for improved posture and R lateral weight shift ("bring your hip to my hip").  Also had her work on tapping L heel again to promote R weight shift.  Educated that they can work on standing at sink at home and PT demo'd on daughter how to provide assist.  PT did intermittently provide assist at R knee for improved extension and weight bearing.            PATIENT EDUCATION: Education details: Educated on goals  Person educated: Patient and Clinical cytogeneticist) Education method: Consulting civil engineer, Media planner, and Verbal cues Education comprehension: verbalized understanding and needs further education   HOME EXERCISE PROGRAM: Educated to begin working on ADLs (upper body and some lower body bathing, brushing teeth/hair) seated at EOB with feet supported and S from daughter in order to work on improved trunk activation and upright  tolerance/endurance.    Access Code: [email or web address] URL: [email or web address] Date: 03/26/2022 Prepared by: Cameron Sprang  Exercises - Supine Bridge  - 1 x daily - 7 x weekly - 2 sets - 10 reps - Supine Quadricep Sets  - 1 x  daily - 7 x weekly - 2 sets - 10 reps - 5 secs hold - Small Range Straight Leg Raise  - 1 x daily - 7 x weekly - 2 sets - 5 reps - Clamshell  - 1 x daily - 7 x weekly - 2 sets - 10 reps - Seated Hamstring Stretch  - 1 x daily - 7 x weekly - 1 sets - 2 reps - 30 secs hold    GOALS: Goals reviewed with patient? Yes  SHORT TERM GOALS: Target date: 04/10/2022  Pt/daughter will be IND with initial HEP in order to indicate improved functional mobility and dec fall risk. Baseline: Goal status: MET  2.  Pt will perform all aspects of bed mobility at S level in order to indicate improved independence at home.   Baseline: min/guard to light min A when going from SL to sitting Goal status: IN PROGRESS  3.  Pt will perform squat pivot transfers at min A level in order to indicate improved functional mobility.  Baseline: min/mod A  Goal status: IN PROGRESS  4.  Pt will perform sit<>stand to RW at mod A level in order to indicate improved functional mobility and transfers.  Baseline: not safe to stand with RW as of this time Goal status: NOT MET  5.  Pt will perform stand pivot transfers with RW at mod A level in order to indicate improved functional mobility.   Baseline: not safe to do with RW at this time Goal status: NOT MET  6.  Will initiate gait in // bars as able and update LTG as able.  Baseline:  Goal status: MET  7.  Pt will report being able to sit unsupported at home to perform ADLs x 10 mins without LOB.   Baseline:  Goal status: MET   LONG TERM GOALS: Target date: 05/12/2022  Pt/daughter will be IND with final HEP in order to indicate improved functional mobility and dec fall risk. Baseline:  Goal status: INITIAL  2.  Pt will perform  all aspects of bed mobility at mod I level in order to indicate improved functional mobility.   Baseline:  Goal status: INITIAL  3.  Pt will perform squat pivot transfers (to/from w/c and bedside commode/shower chair) at S level in order to indicate improved functional independence.  Baseline:  Goal status: INITIAL  4.  Pt will perform sit<>stand with RW at min A level in order to indicate improved functional mobility.   Baseline:  Goal status: INITIAL  5.  Pt will perform stand pivot transfer with RW at min A level in order to indicate improved functional mobility.  Baseline:  Goal status: INITIAL  6.  Will add gait goal as appropriate.  Baseline:  Goal status: INITIAL  ASSESSMENT:  CLINICAL IMPRESSION: Pt has met 3/7 STGs, progressing towards remaining goals, with the exception of standing and transfers with RW.  She is not safe enough at this time for functional transfers with RW.  Will continue to work towards remaining [place].     OBJECTIVE IMPAIRMENTS Abnormal gait, decreased activity tolerance, decreased balance, decreased endurance, decreased mobility, difficulty walking, decreased ROM, decreased strength, hypomobility, impaired perceived functional ability, impaired flexibility, impaired UE functional use, and postural dysfunction.   ACTIVITY LIMITATIONS carrying, lifting, bending, sitting, standing, squatting, stairs, transfers, bed mobility, bathing, toileting, dressing, and locomotion level  PARTICIPATION LIMITATIONS: meal prep, cleaning, laundry, shopping, and community activity  PERSONAL FACTORS Age, Time since onset of injury/illness/exacerbation, and 3+ comorbidities: see above  are also affecting patient's functional outcome.   REHAB POTENTIAL: Good  CLINICAL DECISION MAKING: Evolving/moderate complexity  EVALUATION COMPLEXITY: Moderate  PLAN: PT FREQUENCY: 2x/week  PT DURATION: 8 weeks  PLANNED INTERVENTIONS: Therapeutic exercises, Therapeutic activity,  Neuromuscular re-education, Balance training, Gait training, Patient/Family education, Self Care, Joint mobilization, Orthotic/Fit training, DME instructions, Aquatic Therapy, and Wheelchair mobility training  PLAN FOR NEXT SESSION: Continue squat pivot transfers (when they have manual chair or can get clinic w/c),  perceptual deficits (work on improving R weight shift).  Add some seated exercises to HEP for core strength, core strength (could do some sitting on rockerboard/BOSU), bed mobility, hip and knee flexibility, trunk dissociation, sit<>stand (to RW when able), gait as able in // bars.  I wonder if she could stand to a RW?? Shes familiar with this so maybe a semi automatic task??   Cameron Sprang, PT, MPT [facility] [address] [place], [place], [address] Phone: [phone]   Fax:  [phone] 04/16/22, 3:07 PM

## 2022-04-18 ENCOUNTER — Ambulatory Visit: Payer: Medicare PPO | Admitting: Physical Therapy

## 2022-04-18 ENCOUNTER — Encounter: Payer: Self-pay | Admitting: Physical Therapy

## 2022-04-18 DIAGNOSIS — M6281 Muscle weakness (generalized): Secondary | ICD-10-CM | POA: Diagnosis not present

## 2022-04-18 DIAGNOSIS — R293 Abnormal posture: Secondary | ICD-10-CM | POA: Diagnosis not present

## 2022-04-18 DIAGNOSIS — I69354 Hemiplegia and hemiparesis following cerebral infarction affecting left non-dominant side: Secondary | ICD-10-CM

## 2022-04-18 DIAGNOSIS — R2681 Unsteadiness on feet: Secondary | ICD-10-CM

## 2022-04-18 NOTE — Therapy (Addendum)
OUTPATIENT PHYSICAL THERAPY NEURO TREATMENT   Patient Name: [name] MRN: [id number] DOB:[birth year], 76 y.o., female Today's Date: 04/19/2022   PCP: Sherrie Mustache, NP REFERRING PROVIDER: Sherrie Mustache, NP   PT End of Session - 04/18/22 0853     Visit Number 7    Number of Visits 17    Date for PT Re-Evaluation 05/12/22    Authorization Type [employer] (10th visit PN needed)    Progress Note Due on Visit 10    PT Start Time 0850    PT Stop Time 0930    PT Time Calculation (min) 40 min    Activity Tolerance Patient tolerated treatment well;Patient limited by fatigue    Behavior During Therapy [facility] for tasks assessed/performed             Past Medical History:  Diagnosis Date   Allergy    Arthritis    Broken ankle    Cataract    Diabetes mellitus without complication ([place])    History of colonoscopy    Hypertension    Macular degeneration syndrome    with edema---being treated.    Stage 4 chronic kidney disease ([place])    Stroke ([place]) 2019   Past Surgical History:  Procedure Laterality Date   callous removal  Left 2017   located on 1st digit on L foot 2/2 diabetes   EYE SURGERY     Patient Active Problem List   Diagnosis Date Noted   Ankle fracture 10/22/2020   Bimalleolar ankle fracture, right, closed, initial encounter 10/19/2020   Cataracts, bilateral 05/03/2020   Stage 4 chronic kidney disease ([place]) 12/21/2019   Iron deficiency anemia 02/03/2019   Pelvic fracture ([place]) 09/21/2018   Allergic reaction caused by a drug 05/06/2018   Bilateral leg edema 96/29/5284   Embolic stroke ([place]) 13/24/4010   Left hemiparesis ([place])    Diabetes mellitus type 2 in nonobese ([place])    Acute ischemic stroke ([place])    Left arm weakness    Overweight (BMI 25.0-29.9) 02/24/2018   Diabetes mellitus without complication ([place]) 27/25/3664   HTN (hypertension) 09/02/2017    ONSET DATE: CVA 2020, ankle fracture 2022  REFERRING DIAG: I69.354 (ICD-10-CM) -  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 Was able to stand some at the sink.   Pt accompanied by:  daughter [name]  PERTINENT HISTORY:   PAIN:  Are you having pain? No  PRECAUTIONS: Fall  WEIGHT BEARING RESTRICTIONS No WBAT in R LE   FALLS: Has patient fallen in last 6 months? No  LIVING ENVIRONMENT: Lives with: lives with their family Lives in: House/apartment Stairs: No Has following equipment at home: Environmental consultant - 2 wheeled, Wheelchair (manual), and Ramped entry  PLOF: Independent with basic ADLs and Independent with household mobility without device (before CVA and ankle fracture)  PATIENT GOALS "To get out of this w/c and to stop wearing diapers."     Today's Treatment:   TRANSFERS: Assistive device utilized: Wheelchair (manual)  Chair to chair: Max A (stand pivot) Comments: Pt in her transport w/c today with soft cushion. At start of session, attempted to perform stand pivot transfer to mat table. Mod assist from therapist prior to transfer to perform scooting/weight shifting towards edge of chair. Cued to have RUE on arm rest to help push to stand, cues/facilitation for forward weight shift prior to stand. Unable to perform transfer  today due to pt unable to adequately get weight anteriorly to stand.     In // bars, performed sit<>stand x 8 reps from pt's transport chair with RUE helping with stand.  Cues for proper foot position, and incr forward lean to stand. Pt did well with forward weight shifting and pushing through legs to stand, performed with min A. In standing, worked on standing tolerance, but pt with incr forward flexed posture throughout with trunk flexion and incr bilat knee flexion (a couple of times having head forwards resting on PT's shoulder). With verbal/tactile/manual cues for upright posture and glute extension. Pt's daughter behind therapist also providing visual cue for pt to look up towards, but pt unable to stand with tall posture today. Pt unable to stand >30 seconds today, and pt with incr fatigue and decr eccentric control back into wheelchair. Pt needing frequent seated rest breaks. During last 2 reps of standing, PT helping to facilitate lateral weight shifting.    Therapeutic Exercise:  SciFit from transport w/c with anti-tippers behind wheels with BLE only for 4 minutes at gear 1.3 , pt fatiguing more with BLE and added in RUE as well for remaining 4 minutes for 8 minutes total. Pt needs encouragement throughout to keep up ROM and move arms/legs.        PATIENT EDUCATION: Education details: continue HEP and work on standing at sink.  Person educated: Patient and Child(ren) Education method: Explanation Education comprehension: verbalized understanding   HOME EXERCISE PROGRAM: Educated to begin working on ADLs (upper body and some lower body bathing, brushing teeth/hair) seated at EOB with feet supported and S from daughter in order to work on improved trunk activation and upright tolerance/endurance.    Access Code: [email or web address] URL: [email or web address] Date: 03/26/2022 Prepared by: Cameron Sprang  Exercises - Supine Bridge  - 1 x daily - 7 x weekly - 2 sets - 10 reps -  Supine Quadricep Sets  - 1 x daily - 7 x weekly - 2 sets - 10 reps - 5 secs hold - Small Range Straight Leg Raise  - 1 x daily - 7 x weekly - 2 sets - 5 reps - Clamshell  - 1 x daily - 7 x weekly - 2 sets - 10 reps - Seated Hamstring Stretch  - 1  x daily - 7 x weekly - 1 sets - 2 reps - 30 secs hold    GOALS: Goals reviewed with patient? Yes  SHORT TERM GOALS: Target date: 04/10/2022  Pt/daughter will be IND with initial HEP in order to indicate improved functional mobility and dec fall risk. Baseline: Goal status: MET  2.  Pt will perform all aspects of bed mobility at S level in order to indicate improved independence at home.   Baseline: min/guard to light min A when going from SL to sitting Goal status: IN PROGRESS  3.  Pt will perform squat pivot transfers at min A level in order to indicate improved functional mobility.  Baseline: min/mod A  Goal status: IN PROGRESS  4.  Pt will perform sit<>stand to RW at mod A level in order to indicate improved functional mobility and transfers.  Baseline: not safe to stand with RW as of this time Goal status: NOT MET  5.  Pt will perform stand pivot transfers with RW at mod A level in order to indicate improved functional mobility.   Baseline: not safe to do with RW at this time Goal status: NOT MET  6.  Will initiate gait in // bars as able and update LTG as able.  Baseline:  Goal status: MET  7.  Pt will report being able to sit unsupported at home to perform ADLs x 10 mins without LOB.   Baseline:  Goal status: MET   LONG TERM GOALS: Target date: 05/12/2022  Pt/daughter will be IND with final HEP in order to indicate improved functional mobility and dec fall risk. Baseline:  Goal status: INITIAL  2.  Pt will perform all aspects of bed mobility at mod I level in order to indicate improved functional mobility.   Baseline:  Goal status: INITIAL  3.  Pt will perform squat pivot transfers (to/from w/c and bedside commode/shower  chair) at S level in order to indicate improved functional independence.  Baseline:  Goal status: INITIAL  4.  Pt will perform sit<>stand with RW at min A level in order to indicate improved functional mobility.   Baseline:  Goal status: INITIAL  5.  Pt will perform stand pivot transfer with RW at min A level in order to indicate improved functional mobility.  Baseline:  Goal status: INITIAL  6.  Will add gait goal as appropriate.  Baseline:  Goal status: INITIAL  ASSESSMENT:  CLINICAL IMPRESSION: Unable to perform stand pivot transfer form transport w/c to mat table today. Instead performed standing tolerance and working on sit <> stands in // bars from transport wheelchair. Pt with incr forward flexed posture during sit <> stands today (pt's daughter reports she normally stands taller at home). Pt more fatigued during today's session needing frequent rest breaks.   Will continue to work towards remaining [place].     OBJECTIVE IMPAIRMENTS Abnormal gait, decreased activity tolerance, decreased balance, decreased endurance, decreased mobility, difficulty walking, decreased ROM, decreased strength, hypomobility, impaired perceived functional ability, impaired flexibility, impaired UE functional use, and postural dysfunction.   ACTIVITY LIMITATIONS carrying, lifting, bending, sitting, standing, squatting, stairs, transfers, bed mobility, bathing, toileting, dressing, and locomotion level  PARTICIPATION LIMITATIONS: meal prep, cleaning, laundry, shopping, and community activity  PERSONAL FACTORS Age, Time since onset of injury/illness/exacerbation, and 3+ comorbidities: see above  are also affecting patient's functional outcome.   REHAB POTENTIAL: Good  CLINICAL DECISION MAKING: Evolving/moderate complexity  EVALUATION COMPLEXITY: Moderate  PLAN: PT FREQUENCY: 2x/week  PT DURATION: 8 weeks  PLANNED INTERVENTIONS: Therapeutic exercises, Therapeutic activity, Neuromuscular  re-education, Balance training, Gait training, Patient/Family education, Self Care, Joint mobilization, Orthotic/Fit training, DME instructions, Aquatic Therapy, and Wheelchair mobility training  PLAN FOR NEXT SESSION: Continue squat pivot transfers (when they have manual chair or can get clinic w/c),  perceptual deficits (work on improving R weight shift).  Add some seated exercises to HEP for core strength, core strength (could do some sitting on rockerboard/BOSU), bed mobility, hip and knee flexibility, trunk dissociation, sit<>stand (to RW when able), gait as able in // bars.  I wonder if she could stand to a RW?? Shes familiar with this so maybe a semi automatic task??    Janann August, PT, DPT 04/19/22 9:13 AM

## 2022-04-23 ENCOUNTER — Ambulatory Visit: Payer: Medicare PPO | Admitting: Rehabilitation

## 2022-04-23 ENCOUNTER — Encounter: Payer: Self-pay | Admitting: Rehabilitation

## 2022-04-23 DIAGNOSIS — M6281 Muscle weakness (generalized): Secondary | ICD-10-CM

## 2022-04-23 DIAGNOSIS — R293 Abnormal posture: Secondary | ICD-10-CM | POA: Diagnosis not present

## 2022-04-23 DIAGNOSIS — R2681 Unsteadiness on feet: Secondary | ICD-10-CM

## 2022-04-23 DIAGNOSIS — I69354 Hemiplegia and hemiparesis following cerebral infarction affecting left non-dominant side: Secondary | ICD-10-CM | POA: Diagnosis not present

## 2022-04-23 NOTE — Therapy (Signed)
OUTPATIENT PHYSICAL THERAPY NEURO TREATMENT   Patient Name: [name] MRN: [id number] DOB:Dec 15, 1945, 76 y.o., female Today's Date: 04/23/2022   PCP: Sherrie Mustache, NP REFERRING PROVIDER: Sherrie Mustache, NP   PT End of Session - 04/23/22 0844     Visit Number 8    Number of Visits 17    Date for PT Re-Evaluation 05/12/22    Authorization Type [employer] (10th visit PN needed)    Progress Note Due on Visit 10    PT Start Time [phone]    PT Stop Time 0930    PT Time Calculation (min) 44 min    Activity Tolerance Patient tolerated treatment well;Patient limited by fatigue    Behavior During Therapy [facility] for tasks assessed/performed             Past Medical History:  Diagnosis Date   Allergy    Arthritis    Broken ankle    Cataract    Diabetes mellitus without complication ([place])    History of colonoscopy    Hypertension    Macular degeneration syndrome    with edema---being treated.    Stage 4 chronic kidney disease ([place])    Stroke ([place]) 2019   Past Surgical History:  Procedure Laterality Date   callous removal  Left 2017   located on 1st digit on L foot 2/2 diabetes   EYE SURGERY     Patient Active Problem List   Diagnosis Date Noted   Ankle fracture 10/22/2020   Bimalleolar ankle fracture, right, closed, initial encounter 10/19/2020   Cataracts, bilateral 05/03/2020   Stage 4 chronic kidney disease ([place]) 12/21/2019   Iron deficiency anemia 02/03/2019   Pelvic fracture ([place]) 09/21/2018   Allergic reaction caused by a drug 05/06/2018   Bilateral leg edema 11/91/4782   Embolic stroke ([place]) 95/62/1308   Left hemiparesis ([place])    Diabetes mellitus type 2 in nonobese ([place])    Acute ischemic stroke ([place])    Left arm weakness    Overweight (BMI 25.0-29.9) 02/24/2018   Diabetes mellitus without complication ([place]) 65/78/4696   HTN (hypertension) 09/02/2017    ONSET DATE: CVA 2020, ankle fracture 2022  REFERRING DIAG: I69.354 (ICD-10-CM) -  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  But overall doing well.   Pt accompanied by:  daughter [name]  PERTINENT HISTORY:   PAIN:  Are you having pain? No  PRECAUTIONS: Fall  WEIGHT BEARING RESTRICTIONS No WBAT in R LE   FALLS: Has patient fallen in last 6 months? No  LIVING ENVIRONMENT: Lives with: lives with their family Lives in: House/apartment Stairs: No Has following equipment at home: Environmental consultant - 2 wheeled, Wheelchair (manual), and Ramped entry  PLOF: Independent with basic ADLs and Independent with household mobility without device (before CVA and ankle fracture)  PATIENT GOALS "To get out of this w/c and to stop wearing diapers."     Today's Treatment:   TRANSFERS: Assistive device utilized: Wheelchair (manual)  Sit to stand: Mod A and Max A Stand to sit: Mod A Chair to chair: Max A (stand pivot) Comments: Pt in her transport w/c today with soft cushion.  Performed stand pivot transfer to mat from transport chair at max fading to mod A level.  Performed x 2 reps during session with cues/facilitation for adequate forward/lateral weight shift, cues for keeping head up and safe foot placement as well as hand placement to assist.  Emphasis on  second transfer being more of a squat pivot to reinforce forward weight shift and "pushing" through LEs.  She was able to do this in more of a mod A level.   On EOM, worked on forward and midline weight shifting with "pushing" large physioball forward with RUE.  Tend to note that she remains in R weight shift throughout therefore utilized mirror and tactile cues from PT to ensure more midline/forward weight shift.  Performed x 10 reps with pt doing better with less facilitation and with increased repetition.  Progressed to forward weight shift with buttocks lifts x 5 reps with PT providing assist under L thigh.    In // bars, performed sit<>stand x 2 reps with RUE helping with stand.  Pt did very well initiated and performing more upright stand today with cues for lifting chest and "pushing" through LEs to stand taller.  Pt only needing mod fading to min A to stand in // bars today.               PATIENT EDUCATION: Education details: Educated on goals  Person educated: Patient and Clinical cytogeneticist) Education method: Consulting civil engineer, Media planner, and Verbal cues Education comprehension: verbalized understanding and needs further education   HOME EXERCISE PROGRAM: Educated to begin working on ADLs (upper body and some lower body bathing, brushing teeth/hair) seated at EOB with feet supported and S from daughter in order to work on improved trunk activation and upright tolerance/endurance.    Access Code: [email or web address] URL: [email or web address] Date: 03/26/2022 Prepared by: Cameron Sprang  Exercises - Supine Bridge  - 1 x daily - 7 x weekly - 2 sets - 10 reps - Supine Quadricep Sets  - 1 x daily - 7 x weekly - 2 sets - 10 reps - 5 secs hold - Small Range Straight Leg Raise  - 1 x daily - 7 x weekly - 2 sets - 5 reps - Clamshell  - 1 x daily - 7 x weekly - 2 sets - 10 reps - Seated Hamstring Stretch  - 1 x daily - 7 x weekly - 1 sets - 2 reps - 30 secs hold    GOALS: Goals reviewed  with patient? Yes  SHORT TERM GOALS: Target date: 04/10/2022  Pt/daughter will be IND with initial  HEP in order to indicate improved functional mobility and dec fall risk. Baseline: Goal status: MET  2.  Pt will perform all aspects of bed mobility at S level in order to indicate improved independence at home.   Baseline: min/guard to light min A when going from SL to sitting Goal status: IN PROGRESS  3.  Pt will perform squat pivot transfers at min A level in order to indicate improved functional mobility.  Baseline: min/mod A  Goal status: IN PROGRESS  4.  Pt will perform sit<>stand to RW at mod A level in order to indicate improved functional mobility and transfers.  Baseline: not safe to stand with RW as of this time Goal status: NOT MET  5.  Pt will perform stand pivot transfers with RW at mod A level in order to indicate improved functional mobility.   Baseline: not safe to do with RW at this time Goal status: NOT MET  6.  Will initiate gait in // bars as able and update LTG as able.  Baseline:  Goal status: MET  7.  Pt will report being able to sit unsupported at home to perform ADLs x 10 mins without LOB.   Baseline:  Goal status: MET   LONG TERM GOALS: Target date: 05/12/2022  Pt/daughter will be IND with final HEP in order to indicate improved functional mobility and dec fall risk. Baseline:  Goal status: INITIAL  2.  Pt will perform all aspects of bed mobility at mod I level in order to indicate improved functional mobility.   Baseline:  Goal status: INITIAL  3.  Pt will perform squat pivot transfers (to/from w/c and bedside commode/shower chair) at S level in order to indicate improved functional independence.  Baseline:  Goal status: INITIAL  4.  Pt will perform sit<>stand with RW at min A level in order to indicate improved functional mobility.   Baseline:  Goal status: INITIAL  5.  Pt will perform stand pivot transfer with RW at min A level in order to  indicate improved functional mobility.  Baseline:  Goal status: INITIAL  6.  Will add gait goal as appropriate.  Baseline:  Goal status: INITIAL  ASSESSMENT:  CLINICAL IMPRESSION: Skilled session focused on NMR for improved midline posture, forward weight shift and weight bearing through LEs to translate to improved transfers and standing.  Pt makes progress in session and performed 2 stands with min A in // bars and demo's improved ability to shift forward on both vs previous sessions.    OBJECTIVE IMPAIRMENTS Abnormal gait, decreased activity tolerance, decreased balance, decreased endurance, decreased mobility, difficulty walking, decreased ROM, decreased strength, hypomobility, impaired perceived functional ability, impaired flexibility, impaired UE functional use, and postural dysfunction.   ACTIVITY LIMITATIONS carrying, lifting, bending, sitting, standing, squatting, stairs, transfers, bed mobility, bathing, toileting, dressing, and locomotion level  PARTICIPATION LIMITATIONS: meal prep, cleaning, laundry, shopping, and community activity  PERSONAL FACTORS Age, Time since onset of injury/illness/exacerbation, and 3+ comorbidities: see above  are also affecting patient's functional outcome.   REHAB POTENTIAL: Good  CLINICAL DECISION MAKING: Evolving/moderate complexity  EVALUATION COMPLEXITY: Moderate  PLAN: PT FREQUENCY: 2x/week  PT DURATION: 8 weeks  PLANNED INTERVENTIONS: Therapeutic exercises, Therapeutic activity, Neuromuscular re-education, Balance training, Gait training, Patient/Family education, Self Care, Joint mobilization, Orthotic/Fit training, DME instructions, Aquatic Therapy, and Wheelchair mobility training  PLAN FOR NEXT SESSION: Continue squat pivot transfers (when they have manual chair or can get clinic w/c),  perceptual deficits (work on improving R weight  shift).  Add some seated exercises to HEP for core strength, core strength (could do some sitting  on rockerboard/BOSU), bed mobility, hip and knee flexibility, trunk dissociation, sit<>stand (to RW when able), gait as able in // bars.  I wonder if she could stand to a RW?? Shes familiar with this so maybe a semi automatic task??   Cameron Sprang, PT, MPT [facility] [address] [place], [place], [address] Phone: [phone]   Fax:  [phone] 04/23/22, 9:38 AM

## 2022-04-25 ENCOUNTER — Encounter: Payer: Self-pay | Admitting: Rehabilitation

## 2022-04-25 ENCOUNTER — Ambulatory Visit: Payer: Medicare PPO | Admitting: Rehabilitation

## 2022-04-25 DIAGNOSIS — M6281 Muscle weakness (generalized): Secondary | ICD-10-CM

## 2022-04-25 DIAGNOSIS — I69354 Hemiplegia and hemiparesis following cerebral infarction affecting left non-dominant side: Secondary | ICD-10-CM

## 2022-04-25 DIAGNOSIS — R2681 Unsteadiness on feet: Secondary | ICD-10-CM

## 2022-04-25 DIAGNOSIS — R293 Abnormal posture: Secondary | ICD-10-CM

## 2022-04-25 NOTE — Therapy (Addendum)
OUTPATIENT PHYSICAL THERAPY NEURO TREATMENT and PROGRESS NOTE   Patient Name: [name] MRN: [id number] DOB:1945/09/10, 76 y.o., female Today's Date: 04/25/2022   PCP: Sherrie Mustache, NP REFERRING PROVIDER: Sherrie Mustache, NP   PT End of Session - 04/25/22 0849     Visit Number 9    Number of Visits 17    Date for PT Re-Evaluation 05/12/22    Authorization Type [employer] (10th visit PN needed)    Progress Note Due on Visit 10    PT Start Time [id number]    PT Stop Time 0930    PT Time Calculation (min) 44 min    Activity Tolerance Patient tolerated treatment well;Patient limited by fatigue    Behavior During Therapy [facility] for tasks assessed/performed             Past Medical History:  Diagnosis Date   Allergy    Arthritis    Broken ankle    Cataract    Diabetes mellitus without complication ([place])    History of colonoscopy    Hypertension    Macular degeneration syndrome    with edema---being treated.    Stage 4 chronic kidney disease ([place])    Stroke ([place]) 2019   Past Surgical History:  Procedure Laterality Date   callous removal  Left 2017   located on 1st digit on L foot 2/2 diabetes   EYE SURGERY     Patient Active Problem List   Diagnosis Date Noted   Ankle fracture 10/22/2020   Bimalleolar ankle fracture, right, closed, initial encounter 10/19/2020   Cataracts, bilateral 05/03/2020   Stage 4 chronic kidney disease ([place]) 12/21/2019   Iron deficiency anemia 02/03/2019   Pelvic fracture ([place]) 09/21/2018   Allergic reaction caused by a drug 05/06/2018   Bilateral leg edema 75/30/0511   Embolic stroke ([place]) 08/18/1733   Left hemiparesis ([place])    Diabetes mellitus type 2 in nonobese ([place])    Acute ischemic stroke ([place])    Left arm weakness    Overweight (BMI 25.0-29.9) 02/24/2018   Diabetes mellitus without complication ([place]) 67/07/4101   HTN (hypertension) 09/02/2017    ONSET DATE: CVA 2020, ankle fracture 2022  REFERRING DIAG: I69.354  (ICD-10-CM) - Hemiplegia and hemiparesis following cerebral infarction affecting left non-dominant side (HCC) M25.371 (ICD-10-CM) - Instability of right ankle joint S82.841P (ICD-10-CM) - Closed bimalleolar fracture of right ankle with malunion, subsequent encounter R26.81 (ICD-10-CM) - Unstable gait   THERAPY DIAG:  Unsteadiness on feet  Muscle weakness (generalized)  Hemiplegia and hemiparesis following cerebral infarction affecting left non-dominant side (HCC)  Abnormal posture  Rationale for Evaluation and Treatment Rehabilitation  SUBJECTIVE:  SUBJECTIVE STATEMENT: Doing well today, no issues.   Pt accompanied by:  daughter [name]  PERTINENT HISTORY:   PAIN:  Are you having pain? No  PRECAUTIONS: Fall  WEIGHT BEARING RESTRICTIONS No WBAT in R LE   FALLS: Has patient fallen in last 6 months? No  LIVING ENVIRONMENT: Lives with: lives with their family Lives in: House/apartment Stairs: No Has following equipment at home: Environmental consultant - 2 wheeled, Wheelchair (manual), and Ramped entry  PLOF: Independent with basic ADLs and Independent with household mobility without device (before CVA and ankle fracture)  PATIENT GOALS "To get out of this w/c and to stop wearing diapers."     Today's Treatment:   TRANSFERS: Assistive device utilized: Wheelchair (manual)  Sit to stand: Mod A Stand to sit: Mod A Chair to chair: Mod A (squat pivot) Comments: Pt in her transport w/c today with soft cushion.  Performed squat pivot transfer to mat from transport chair at mod A level today with marked improvement in forward weight shift/trunk lean.  Performed x 1 reps during session with cues/facilitation for adequate forward/lateral weight shift, cues for keeping head up and safe foot placement as well as hand  placement to assist.     Most of session spent in stedy (x approx 25 mins) with intermittent modified sitting as needed.  She did need 1 seated rest break all the way into arm chair, but otherwise did well.  Performed several modified sit<>stand in stedy with cues and facilitation for forward weight shift, hip extension at pelvis and tactile/verbal cues at chest for upright posture.  She did much better today at maintaining midline, esp with use of mirror for feedback.  While in stedy worked on trunk rotation reaching left with RUE for cones and placing to the R.  PT varied location of targets and provided light facilitation at trunk for improved rotation.   Transitioned to reaching for bean bag with RUE (to the left side) in modified sitting then standing for toss into basket.  Performed x 2 reps of 5 tosses.  Note increased fatigue with increased repetition today, but overall tolerated very well and demonstrated more midline posture.  From [place], returned to her w/c.    Briefly demonstrated/discussed trying to place LUE on small ball to the left when seated at EOB just to work on getting LUE away from body for more midline posture.  Will try this next session.              PATIENT EDUCATION: Education details: Educated on really working on HEP this week as I will not see her until next Thursday.  Person educated: Patient and Child(ren) Education method: Explanation, Demonstration, and Verbal cues Education comprehension: verbalized understanding and needs further education   HOME EXERCISE PROGRAM: Educated to begin working on ADLs (upper body and some lower body bathing, brushing teeth/hair) seated at EOB with feet supported and S from daughter in order to work on improved trunk activation and upright tolerance/endurance.    Access Code: [email or web address] URL: [email or web address] Date: 03/26/2022 Prepared by: Cameron Sprang  Exercises - Supine Bridge  - 1 x daily - 7 x weekly -  2 sets - 10 reps - Supine Quadricep Sets  - 1 x daily - 7 x weekly - 2 sets - 10 reps - 5 secs hold - Small Range Straight Leg Raise  - 1 x daily - 7 x weekly - 2 sets - 5 reps - Clamshell  - 1 x daily - 7 x  weekly - 2 sets - 10 reps - Seated Hamstring Stretch  - 1 x daily - 7 x weekly - 1 sets - 2 reps - 30 secs hold    GOALS: Goals reviewed with patient? Yes  SHORT TERM GOALS: Target date: 04/10/2022  Pt/daughter will be IND with initial HEP in order to indicate improved functional mobility and dec fall risk. Baseline: Goal status: MET  2.  Pt will perform all aspects of bed mobility at S level in order to indicate improved independence at home.   Baseline: min/guard to light min A when going from SL to sitting Goal status: IN PROGRESS  3.  Pt will perform squat pivot transfers at min A level in order to indicate improved functional mobility.  Baseline: min/mod A  Goal status: IN PROGRESS  4.  Pt will perform sit<>stand to RW at mod A level in order to indicate improved functional mobility and transfers.  Baseline: not safe to stand with RW as of this time Goal status: NOT MET  5.  Pt will perform stand pivot transfers with RW at mod A level in order to indicate improved functional mobility.   Baseline: not safe to do with RW at this time Goal status: NOT MET  6.  Will initiate gait in // bars as able and update LTG as able.  Baseline:  Goal status: MET  7.  Pt will report being able to sit unsupported at home to perform ADLs x 10 mins without LOB.   Baseline:  Goal status: MET   LONG TERM GOALS: Target date: 05/12/2022  Pt/daughter will be IND with final HEP in order to indicate improved functional mobility and dec fall risk. Baseline:  Goal status: INITIAL  2.  Pt will perform all aspects of bed mobility at mod I level in order to indicate improved functional mobility.   Baseline:  Goal status: INITIAL  3.  Pt will perform squat pivot transfers (to/from w/c and  bedside commode/shower chair) at S level in order to indicate improved functional independence.  Baseline:  Goal status: INITIAL  4.  Pt will perform sit<>stand with RW at min A level in order to indicate improved functional mobility.   Baseline:  Goal status: INITIAL  5.  Pt will perform stand pivot transfer with RW at min A level in order to indicate improved functional mobility.  Baseline:  Goal status: INITIAL  6.  Will add gait goal as appropriate.  Baseline:  Goal status: INITIAL   Progress Note Reporting Period 03/13/22 to 10/19  See note below for Objective Data and Assessment of Progress/Goals.      ASSESSMENT:  CLINICAL IMPRESSION: Skilled session focused on NMR for improved midline posture, forward weight shift and weight bearing through LEs, and improving trunk rotation with use of stedy.  She was able to remain in stedy for approx 25 mins today but with marked fatigue at end of session.   OBJECTIVE IMPAIRMENTS Abnormal gait, decreased activity tolerance, decreased balance, decreased endurance, decreased mobility, difficulty walking, decreased ROM, decreased strength, hypomobility, impaired perceived functional ability, impaired flexibility, impaired UE functional use, and postural dysfunction.   ACTIVITY LIMITATIONS carrying, lifting, bending, sitting, standing, squatting, stairs, transfers, bed mobility, bathing, toileting, dressing, and locomotion level  PARTICIPATION LIMITATIONS: meal prep, cleaning, laundry, shopping, and community activity  PERSONAL FACTORS Age, Time since onset of injury/illness/exacerbation, and 3+ comorbidities: see above  are also affecting patient's functional outcome.   REHAB POTENTIAL: Good  CLINICAL DECISION MAKING: Evolving/moderate complexity  EVALUATION COMPLEXITY: Moderate  PLAN: PT FREQUENCY: 2x/week  PT DURATION: 8 weeks  PLANNED INTERVENTIONS: Therapeutic exercises, Therapeutic activity, Neuromuscular re-education,  Balance training, Gait training, Patient/Family education, Self Care, Joint mobilization, Orthotic/Fit training, DME instructions, Aquatic Therapy, and Wheelchair mobility training  PLAN FOR NEXT SESSION: Try seated on EOM with LUE on small ball shifting towards arm, moving arm away from body, would like to see if she can get LUE extended to mat (flat palm on mat).  Continue squat pivot transfers (when they have manual chair or can get clinic w/c),  perceptual deficits (work on improving R weight shift).  Add some seated exercises to HEP for core strength, core strength (could do some sitting on rockerboard/BOSU), bed mobility, hip and knee flexibility, trunk dissociation, sit<>stand (to RW when able), gait as able in // bars.  I wonder if she could stand to a RW?? Shes familiar with this so maybe a semi automatic task??   Cameron Sprang, PT, MPT [facility] [address] [place], [place], [address] Phone: [phone]   Fax:  [phone] 04/25/22, 10:26 AM

## 2022-04-26 ENCOUNTER — Other Ambulatory Visit: Payer: Self-pay | Admitting: Nurse Practitioner

## 2022-04-30 ENCOUNTER — Ambulatory Visit: Payer: Medicare PPO | Admitting: Physical Therapy

## 2022-05-01 DIAGNOSIS — M5459 Other low back pain: Secondary | ICD-10-CM | POA: Diagnosis not present

## 2022-05-01 DIAGNOSIS — M9903 Segmental and somatic dysfunction of lumbar region: Secondary | ICD-10-CM | POA: Diagnosis not present

## 2022-05-01 DIAGNOSIS — M9901 Segmental and somatic dysfunction of cervical region: Secondary | ICD-10-CM | POA: Diagnosis not present

## 2022-05-01 DIAGNOSIS — M9902 Segmental and somatic dysfunction of thoracic region: Secondary | ICD-10-CM | POA: Diagnosis not present

## 2022-05-01 DIAGNOSIS — R293 Abnormal posture: Secondary | ICD-10-CM | POA: Diagnosis not present

## 2022-05-02 ENCOUNTER — Ambulatory Visit: Payer: Medicare PPO | Admitting: Rehabilitation

## 2022-05-07 ENCOUNTER — Encounter: Payer: Self-pay | Admitting: Rehabilitation

## 2022-05-07 ENCOUNTER — Ambulatory Visit: Payer: Medicare PPO | Admitting: Rehabilitation

## 2022-05-07 DIAGNOSIS — R293 Abnormal posture: Secondary | ICD-10-CM

## 2022-05-07 DIAGNOSIS — M6281 Muscle weakness (generalized): Secondary | ICD-10-CM | POA: Diagnosis not present

## 2022-05-07 DIAGNOSIS — I69354 Hemiplegia and hemiparesis following cerebral infarction affecting left non-dominant side: Secondary | ICD-10-CM | POA: Diagnosis not present

## 2022-05-07 DIAGNOSIS — R2681 Unsteadiness on feet: Secondary | ICD-10-CM | POA: Diagnosis not present

## 2022-05-07 NOTE — Therapy (Addendum)
OUTPATIENT PHYSICAL THERAPY NEURO TREATMENT/PROGRESS NOTE   Patient Name: [name] MRN: [id number] DOB:06-18-1946, 76 y.o., female Today's Date: 05/07/2022   PCP: Sherrie Mustache, NP REFERRING PROVIDER: Sherrie Mustache, NP   PT End of Session - 05/07/22 1047     Visit Number 10    Number of Visits 17    Date for PT Re-Evaluation 05/12/22    Authorization Type [employer] (10th visit PN needed)    Progress Note Due on Visit 10    PT Start Time 0845    PT Stop Time 0931    PT Time Calculation (min) 46 min    Activity Tolerance Patient tolerated treatment well;Patient limited by fatigue    Behavior During Therapy [facility] for tasks assessed/performed             Past Medical History:  Diagnosis Date   Allergy    Arthritis    Broken ankle    Cataract    Diabetes mellitus without complication ([place])    History of colonoscopy    Hypertension    Macular degeneration syndrome    with edema---being treated.    Stage 4 chronic kidney disease ([place])    Stroke ([place]) 2019   Past Surgical History:  Procedure Laterality Date   callous removal  Left 2017   located on 1st digit on L foot 2/2 diabetes   EYE SURGERY     Patient Active Problem List   Diagnosis Date Noted   Ankle fracture 10/22/2020   Bimalleolar ankle fracture, right, closed, initial encounter 10/19/2020   Cataracts, bilateral 05/03/2020   Stage 4 chronic kidney disease ([place]) 12/21/2019   Iron deficiency anemia 02/03/2019   Pelvic fracture ([place]) 09/21/2018   Allergic reaction caused by a drug 05/06/2018   Bilateral leg edema 93/79/0240   Embolic stroke ([place]) 97/35/3299   Left hemiparesis ([place])    Diabetes mellitus type 2 in nonobese ([place])    Acute ischemic stroke ([place])    Left arm weakness    Overweight (BMI 25.0-29.9) 02/24/2018   Diabetes mellitus without complication ([place]) 24/26/8341   HTN (hypertension) 09/02/2017    ONSET DATE: CVA 2020, ankle fracture 2022  REFERRING DIAG: I69.354  (ICD-10-CM) - Hemiplegia and hemiparesis following cerebral infarction affecting left non-dominant side (HCC) M25.371 (ICD-10-CM) - Instability of right ankle joint S82.841P (ICD-10-CM) - Closed bimalleolar fracture of right ankle with malunion, subsequent encounter R26.81 (ICD-10-CM) - Unstable gait   THERAPY DIAG:  Unsteadiness on feet  Muscle weakness (generalized)  Hemiplegia and hemiparesis following cerebral infarction affecting left non-dominant side (HCC)  Abnormal posture  Rationale for Evaluation and Treatment Rehabilitation  SUBJECTIVE:  SUBJECTIVE STATEMENT: Doing well, ready for therapy today.   Pt accompanied by:  daughter [name]  PERTINENT HISTORY:   PAIN:  Are you having pain? No  PRECAUTIONS: Fall  WEIGHT BEARING RESTRICTIONS No WBAT in R LE   FALLS: Has patient fallen in last 6 months? No  LIVING ENVIRONMENT: Lives with: lives with their family Lives in: House/apartment Stairs: No Has following equipment at home: Environmental consultant - 2 wheeled, Wheelchair (manual), and Ramped entry  PLOF: Independent with basic ADLs and Independent with household mobility without device (before CVA and ankle fracture)  PATIENT GOALS "To get out of this w/c and to stop wearing diapers."     Today's Treatment:   TRANSFERS: Assistive device utilized: Wheelchair (manual)  Sit to stand: Mod A Stand to sit: Mod A Chair to chair: Mod A (squat pivot) Comments: Pt in her transport w/c today with soft cushion.  Performed squat pivot transfer to mat from transport chair at mod A level today with marked improvement in forward weight shift/trunk lean.  Performed x 4 reps during session with cues/facilitation for adequate forward/lateral weight shift, cues for keeping head up and safe foot placement as well  as hand placement to assist.    While seated on mat, had her scoot forward to straddle sit kaye bench for more anterior pelvic tilt and upright posture.  While seated on kaye bench had her perform lateral reaching, with emphasis on L lateral reaching (had pt place L hand on PTs thigh on left) with forward/left lateral weight shift.  Provided light tactile cues throughout for upright posture and forward flexion from hips/pelvis.  Attempted to have pt reach forward with RUE to edge of bench and "lift" buttocks from bench using LEs.  Pt reports increased pain in L hip due to increased stretch, therefore provided max +2 (daughter assisted) backwards to mat.  Kept kaye bench placed to continue hip add stretch performing same tasks.  Pt able to place L elbow/forearm to ball with PT provided facilitation for rolling ball laterally with moving arm slightly away from body and also increased L lateral weight shift.  Cues to return to midline not to the R (utilized mirror throughout).  This did improve in session.  Pt with some pain in L shoulder, so modified activities as needed.  PT also had pt place her RUE to PTs LUE to reinforce left lateral weight shift and decreased pusher tendency.    Ended session with 3 squat pivot transfers, see above for details.                PATIENT EDUCATION: Education details: Educated on really working on HEP this week as I will not see her until next Thursday.  Person educated: Patient and Child(ren) Education method: Explanation, Demonstration, and Verbal cues Education comprehension: verbalized understanding and needs further education   HOME EXERCISE PROGRAM: Educated to begin working on ADLs (upper body and some lower body bathing, brushing teeth/hair) seated at EOB with feet supported and S from daughter in order to work on improved trunk activation and upright tolerance/endurance.    Access Code: [email or web address] URL: [email or web address] Date:  03/26/2022 Prepared by: Cameron Sprang  Exercises - Supine Bridge  - 1 x daily - 7 x weekly - 2 sets - 10 reps - Supine Quadricep Sets  - 1 x daily - 7 x weekly - 2 sets - 10 reps - 5 secs hold - Small Range Straight Leg Raise  - 1 x daily - 7 x weekly -  2 sets - 5 reps - Clamshell  - 1 x daily - 7 x weekly - 2 sets - 10 reps - Seated Hamstring Stretch  - 1 x daily - 7 x weekly - 1 sets - 2 reps - 30 secs hold    GOALS: Goals reviewed with patient? Yes  SHORT TERM GOALS: Target date: 04/10/2022  Pt/daughter will be IND with initial HEP in order to indicate improved functional mobility and dec fall risk. Baseline: Goal status: MET  2.  Pt will perform all aspects of bed mobility at S level in order to indicate improved independence at home.   Baseline: min/guard to light min A when going from SL to sitting Goal status: IN PROGRESS  3.  Pt will perform squat pivot transfers at min A level in order to indicate improved functional mobility.  Baseline: min/mod A  Goal status: IN PROGRESS  4.  Pt will perform sit<>stand to RW at mod A level in order to indicate improved functional mobility and transfers.  Baseline: not safe to stand with RW as of this time Goal status: NOT MET  5.  Pt will perform stand pivot transfers with RW at mod A level in order to indicate improved functional mobility.   Baseline: not safe to do with RW at this time Goal status: NOT MET  6.  Will initiate gait in // bars as able and update LTG as able.  Baseline:  Goal status: MET  7.  Pt will report being able to sit unsupported at home to perform ADLs x 10 mins without LOB.   Baseline:  Goal status: MET   LONG TERM GOALS: Target date: 05/12/2022  Pt/daughter will be IND with final HEP in order to indicate improved functional mobility and dec fall risk. Baseline:  Goal status: INITIAL  2.  Pt will perform all aspects of bed mobility at mod I level in order to indicate improved functional mobility.    Baseline:  Goal status: INITIAL  3.  Pt will perform squat pivot transfers (to/from w/c and bedside commode/shower chair) at S level in order to indicate improved functional independence.  Baseline:  Goal status: INITIAL  4.  Pt will perform sit<>stand with RW at min A level in order to indicate improved functional mobility.   Baseline:  Goal status: INITIAL  5.  Pt will perform stand pivot transfer with RW at min A level in order to indicate improved functional mobility.  Baseline:  Goal status: INITIAL  6.  Will add gait goal as appropriate.  Baseline:  Goal status: INITIAL  Progress Note Reporting Period 03/13/22 to 05/07/22  See note below for Objective Data and Assessment of Progress/Goals.       ASSESSMENT:  CLINICAL IMPRESSION: Skilled session focused on NMR for improved midline posture, forward weight shift and weight bearing through LEs, and improved lateral weight shift to the L with slight focus on LUE away from body.  Pt is doing much better with forward weight shift and initiating "lift off" however still has difficulty pushing through LEs for more elevation into partial/full stand.   OBJECTIVE IMPAIRMENTS Abnormal gait, decreased activity tolerance, decreased balance, decreased endurance, decreased mobility, difficulty walking, decreased ROM, decreased strength, hypomobility, impaired perceived functional ability, impaired flexibility, impaired UE functional use, and postural dysfunction.   ACTIVITY LIMITATIONS carrying, lifting, bending, sitting, standing, squatting, stairs, transfers, bed mobility, bathing, toileting, dressing, and locomotion level  PARTICIPATION LIMITATIONS: meal prep, cleaning, laundry, shopping, and community activity  PERSONAL FACTORS  Age, Time since onset of injury/illness/exacerbation, and 3+ comorbidities: see above  are also affecting patient's functional outcome.   REHAB POTENTIAL: Good  CLINICAL DECISION MAKING: Evolving/moderate  complexity  EVALUATION COMPLEXITY: Moderate  PLAN: PT FREQUENCY: 2x/week  PT DURATION: 8 weeks  PLANNED INTERVENTIONS: Therapeutic exercises, Therapeutic activity, Neuromuscular re-education, Balance training, Gait training, Patient/Family education, Self Care, Joint mobilization, Orthotic/Fit training, DME instructions, Aquatic Therapy, and Wheelchair mobility training  PLAN FOR NEXT SESSION:  Continue squat pivot transfers (when they have manual chair or can get clinic w/c),  perceptual deficits (work on improving R weight shift).  Add some seated exercises to HEP for core strength, core strength (could do some sitting on rockerboard/BOSU), bed mobility, hip and knee flexibility, trunk dissociation, sit<>stand (to RW when able), gait as able in // bars.  I wonder if she could stand to a RW?? Shes familiar with this so maybe a semi automatic task??   Cameron Sprang, PT, MPT [facility] [address] [place], [place], [address] Phone: [phone]   Fax:  [phone] 05/07/22, 10:54 AM

## 2022-05-08 ENCOUNTER — Ambulatory Visit: Payer: Medicare PPO | Attending: Nurse Practitioner | Admitting: Rehabilitation

## 2022-05-08 ENCOUNTER — Encounter: Payer: Self-pay | Admitting: Rehabilitation

## 2022-05-08 DIAGNOSIS — R293 Abnormal posture: Secondary | ICD-10-CM | POA: Diagnosis not present

## 2022-05-08 DIAGNOSIS — R2681 Unsteadiness on feet: Secondary | ICD-10-CM | POA: Insufficient documentation

## 2022-05-08 DIAGNOSIS — M6281 Muscle weakness (generalized): Secondary | ICD-10-CM | POA: Diagnosis not present

## 2022-05-08 DIAGNOSIS — I69354 Hemiplegia and hemiparesis following cerebral infarction affecting left non-dominant side: Secondary | ICD-10-CM | POA: Diagnosis not present

## 2022-05-08 NOTE — Therapy (Signed)
OUTPATIENT PHYSICAL THERAPY NEURO TREATMENT   Patient Name: [name] MRN: [id number] DOB:August 30, 1945, 76 y.o., female Today's Date: 05/08/2022   PCP: Sherrie Mustache, NP REFERRING PROVIDER: Sherrie Mustache, NP   PT End of Session - 05/08/22 0851     Visit Number 11    Number of Visits 17    Date for PT Re-Evaluation 05/12/22    Authorization Type [employer] (10th visit PN needed)    Progress Note Due on Visit 10    PT Start Time 0846    PT Stop Time 0931    PT Time Calculation (min) 45 min    Activity Tolerance Patient tolerated treatment well;Patient limited by fatigue    Behavior During Therapy [facility] for tasks assessed/performed             Past Medical History:  Diagnosis Date   Allergy    Arthritis    Broken ankle    Cataract    Diabetes mellitus without complication ([place])    History of colonoscopy    Hypertension    Macular degeneration syndrome    with edema---being treated.    Stage 4 chronic kidney disease ([place])    Stroke ([place]) 2019   Past Surgical History:  Procedure Laterality Date   callous removal  Left 2017   located on 1st digit on L foot 2/2 diabetes   EYE SURGERY     Patient Active Problem List   Diagnosis Date Noted   Ankle fracture 10/22/2020   Bimalleolar ankle fracture, right, closed, initial encounter 10/19/2020   Cataracts, bilateral 05/03/2020   Stage 4 chronic kidney disease ([place]) 12/21/2019   Iron deficiency anemia 02/03/2019   Pelvic fracture ([place]) 09/21/2018   Allergic reaction caused by a drug 05/06/2018   Bilateral leg edema 67/70/3403   Embolic stroke ([place]) 52/48/1859   Left hemiparesis ([place])    Diabetes mellitus type 2 in nonobese ([place])    Acute ischemic stroke ([place])    Left arm weakness    Overweight (BMI 25.0-29.9) 02/24/2018   Diabetes mellitus without complication ([place]) 09/31/1216   HTN (hypertension) 09/02/2017    ONSET DATE: CVA 2020, ankle fracture 2022  REFERRING DIAG: I69.354 (ICD-10-CM) -  Hemiplegia and hemiparesis following cerebral infarction affecting left non-dominant side (HCC) M25.371 (ICD-10-CM) - Instability of right ankle joint S82.841P (ICD-10-CM) - Closed bimalleolar fracture of right ankle with malunion, subsequent encounter R26.81 (ICD-10-CM) - Unstable gait   THERAPY DIAG:  Unsteadiness on feet  Muscle weakness (generalized)  Hemiplegia and hemiparesis following cerebral infarction affecting left non-dominant side (HCC)  Abnormal posture  Rationale for Evaluation and Treatment Rehabilitation  SUBJECTIVE:  SUBJECTIVE STATEMENT: "My hip was hurting yesterday from that position."   Pt accompanied by:  daughter [name]  PERTINENT HISTORY:   PAIN:  Are you having pain? No  PRECAUTIONS: Fall  WEIGHT BEARING RESTRICTIONS No WBAT in R LE   FALLS: Has patient fallen in last 6 months? No  LIVING ENVIRONMENT: Lives with: lives with their family Lives in: House/apartment Stairs: No Has following equipment at home: Environmental consultant - 2 wheeled, Wheelchair (manual), and Ramped entry  PLOF: Independent with basic ADLs and Independent with household mobility without device (before CVA and ankle fracture)  PATIENT GOALS "To get out of this w/c and to stop wearing diapers."     Today's Treatment:   TRANSFERS: Assistive device utilized: Wheelchair (manual)  Sit to stand: Mod A/Max A  Stand to sit: Mod A Chair to chair: Mod A (squat pivot) Comments: Pt in her transport w/c today with soft cushion.  Performed squat pivot transfer to mat from transport chair at mod A level today with marked improvement in forward weight shift/trunk lean.  Performed x 1 reps during session with cues/facilitation for adequate forward/lateral weight shift, cues for keeping head up and safe foot placement  as well as hand placement to assist.  Performed sit<>stand several reps in session to do partial stand at mat level at mod/max A with continued cues for forward weight shift.  Note that she uses R arm to push posteriorly until she gets in elbow position.  Had daughter assist with R foot position as first rep her foot was too far under her.    NMR:  While in partial stand on mat (on elbows), performed lateral weight shifting with PT providing proximal assist/support at axilla and placement on pts forearm for improved position and safety with L lateral weight shift.  Progressed to having her lift and reach R up out in front of her>reaching/touching targets placed on mat at varying positions, esp outside BOS to the L so she would have to lift onto L arm.  Assist and cues for improved LE activation.  Daughter notes knees touching each other for improved support therefore PT provided light assist at knees for improved activation for remaining stands.  Pt able to perform several stands for up to approx 1 min at a time before needing to sit.    Therex: Ended session on seated nustep (performed squat pivot transfer as above) x 6 mins with BLEs/RUE at level 3 resistance with cues to maintain rpms in the 50's.  Needs continued cues to reach 50's throughout.  Transferred back to chair as above.             PATIENT EDUCATION: Education details: Educated on really working on HEP this week as I will not see her until next Thursday.  Person educated: Patient and Child(ren) Education method: Explanation, Demonstration, and Verbal cues Education comprehension: verbalized understanding and needs further education   HOME EXERCISE PROGRAM: Educated to begin working on ADLs (upper body and some lower body bathing, brushing teeth/hair) seated at EOB with feet supported and S from daughter in order to work on improved trunk activation and upright tolerance/endurance.    Access Code: [email or web address] URL:  [email or web address] Date: 03/26/2022 Prepared by: Cameron Sprang  Exercises - Supine Bridge  - 1 x daily - 7 x weekly - 2 sets - 10 reps - Supine Quadricep Sets  - 1 x daily - 7 x weekly - 2 sets - 10 reps - 5 secs hold - Small Range Straight  Leg Raise  - 1 x daily - 7 x weekly - 2 sets - 5 reps - Clamshell  - 1 x daily - 7 x weekly - 2 sets - 10 reps - Seated Hamstring Stretch  - 1 x daily - 7 x weekly - 1 sets - 2 reps - 30 secs hold    GOALS: Goals reviewed with patient? Yes  SHORT TERM GOALS: Target date: 04/10/2022  Pt/daughter will be IND with initial HEP in order to indicate improved functional mobility and dec fall risk. Baseline: Goal status: MET  2.  Pt will perform all aspects of bed mobility at S level in order to indicate improved independence at home.   Baseline: min/guard to light min A when going from SL to sitting Goal status: IN PROGRESS  3.  Pt will perform squat pivot transfers at min A level in order to indicate improved functional mobility.  Baseline: min/mod A  Goal status: IN PROGRESS  4.  Pt will perform sit<>stand to RW at mod A level in order to indicate improved functional mobility and transfers.  Baseline: not safe to stand with RW as of this time Goal status: NOT MET  5.  Pt will perform stand pivot transfers with RW at mod A level in order to indicate improved functional mobility.   Baseline: not safe to do with RW at this time Goal status: NOT MET  6.  Will initiate gait in // bars as able and update LTG as able.  Baseline:  Goal status: MET  7.  Pt will report being able to sit unsupported at home to perform ADLs x 10 mins without LOB.   Baseline:  Goal status: MET   LONG TERM GOALS: Target date: 05/12/2022  Pt/daughter will be IND with final HEP in order to indicate improved functional mobility and dec fall risk. Baseline:  Goal status: INITIAL  2.  Pt will perform all aspects of bed mobility at mod I level in order to  indicate improved functional mobility.   Baseline:  Goal status: INITIAL  3.  Pt will perform squat pivot transfers (to/from w/c and bedside commode/shower chair) at S level in order to indicate improved functional independence.  Baseline:  Goal status: INITIAL  4.  Pt will perform sit<>stand with RW at min A level in order to indicate improved functional mobility.   Baseline:  Goal status: INITIAL  5.  Pt will perform stand pivot transfer with RW at min A level in order to indicate improved functional mobility.  Baseline:  Goal status: INITIAL  6.  Will add gait goal as appropriate.  Baseline:  Goal status: INITIAL  ASSESSMENT:  CLINICAL IMPRESSION: Skilled session focused on NMR in partial stand position propped on elbows on elevated therapy mat to WB through LEs, UEs (esp LUE) and address perceptual deficits.  Pt progressing throughout session with task and tolerated well.    OBJECTIVE IMPAIRMENTS Abnormal gait, decreased activity tolerance, decreased balance, decreased endurance, decreased mobility, difficulty walking, decreased ROM, decreased strength, hypomobility, impaired perceived functional ability, impaired flexibility, impaired UE functional use, and postural dysfunction.   ACTIVITY LIMITATIONS carrying, lifting, bending, sitting, standing, squatting, stairs, transfers, bed mobility, bathing, toileting, dressing, and locomotion level  PARTICIPATION LIMITATIONS: meal prep, cleaning, laundry, shopping, and community activity  PERSONAL FACTORS Age, Time since onset of injury/illness/exacerbation, and 3+ comorbidities: see above  are also affecting patient's functional outcome.   REHAB POTENTIAL: Good  CLINICAL DECISION MAKING: Evolving/moderate complexity  EVALUATION COMPLEXITY: Moderate  PLAN: PT FREQUENCY: 2x/week  PT DURATION: 8 weeks  PLANNED INTERVENTIONS: Therapeutic exercises, Therapeutic activity, Neuromuscular re-education, Balance training, Gait  training, Patient/Family education, Self Care, Joint mobilization, Orthotic/Fit training, DME instructions, Aquatic Therapy, and Wheelchair mobility training  PLAN FOR NEXT SESSION:  Continue squat pivot transfers (have just been doing with transport chair),  perceptual deficits (work on improving R weight shift).  Add some seated exercises to HEP for core strength, core strength (could do some sitting on rockerboard/BOSU), bed mobility, hip and knee flexibility, trunk dissociation, sit<>stand (to RW when able), gait as able in // bars.  I wonder if she could stand to a RW?? Shes familiar with this so maybe a semi automatic task??   Cameron Sprang, PT, MPT [facility] [address] [place], [place], [address] Phone: [phone]   Fax:  [phone] 05/08/22, 9:37 AM

## 2022-05-09 ENCOUNTER — Ambulatory Visit: Payer: Medicare PPO | Admitting: Physical Therapy

## 2022-05-14 ENCOUNTER — Ambulatory Visit: Payer: Medicare PPO | Admitting: Rehabilitation

## 2022-05-14 ENCOUNTER — Encounter: Payer: Self-pay | Admitting: Rehabilitation

## 2022-05-14 DIAGNOSIS — R2681 Unsteadiness on feet: Secondary | ICD-10-CM | POA: Diagnosis not present

## 2022-05-14 DIAGNOSIS — R293 Abnormal posture: Secondary | ICD-10-CM | POA: Diagnosis not present

## 2022-05-14 DIAGNOSIS — I69354 Hemiplegia and hemiparesis following cerebral infarction affecting left non-dominant side: Secondary | ICD-10-CM | POA: Diagnosis not present

## 2022-05-14 DIAGNOSIS — M6281 Muscle weakness (generalized): Secondary | ICD-10-CM

## 2022-05-14 NOTE — Therapy (Signed)
OUTPATIENT PHYSICAL THERAPY NEURO TREATMENT/RECERTIFICATION   Patient Name: [name] MRN: [id number] DOB:02/16/1946, 76 y.o., female Today's Date: 05/14/2022   PCP: Sherrie Mustache, NP REFERRING PROVIDER: Sherrie Mustache, NP   PT End of Session - 05/14/22 0854     Visit Number 12    Number of Visits 17    Date for PT Re-Evaluation 05/12/22    Authorization Type [employer] (10th visit PN needed)    Progress Note Due on Visit 10    PT Start Time 0847    PT Stop Time 0932    PT Time Calculation (min) 45 min    Activity Tolerance Patient tolerated treatment well;Patient limited by fatigue    Behavior During Therapy [facility] for tasks assessed/performed             Past Medical History:  Diagnosis Date   Allergy    Arthritis    Broken ankle    Cataract    Diabetes mellitus without complication ([place])    History of colonoscopy    Hypertension    Macular degeneration syndrome    with edema---being treated.    Stage 4 chronic kidney disease ([place])    Stroke ([place]) 2019   Past Surgical History:  Procedure Laterality Date   callous removal  Left 2017   located on 1st digit on L foot 2/2 diabetes   EYE SURGERY     Patient Active Problem List   Diagnosis Date Noted   Ankle fracture 10/22/2020   Bimalleolar ankle fracture, right, closed, initial encounter 10/19/2020   Cataracts, bilateral 05/03/2020   Stage 4 chronic kidney disease ([place]) 12/21/2019   Iron deficiency anemia 02/03/2019   Pelvic fracture ([place]) 09/21/2018   Allergic reaction caused by a drug 05/06/2018   Bilateral leg edema 25/74/9355   Embolic stroke ([place]) 21/74/7159   Left hemiparesis ([place])    Diabetes mellitus type 2 in nonobese ([place])    Acute ischemic stroke ([place])    Left arm weakness    Overweight (BMI 25.0-29.9) 02/24/2018   Diabetes mellitus without complication ([place]) 53/96/7289   HTN (hypertension) 09/02/2017    ONSET DATE: CVA 2020, ankle fracture 2022  REFERRING DIAG: I69.354  (ICD-10-CM) - Hemiplegia and hemiparesis following cerebral infarction affecting left non-dominant side (HCC) M25.371 (ICD-10-CM) - Instability of right ankle joint S82.841P (ICD-10-CM) - Closed bimalleolar fracture of right ankle with malunion, subsequent encounter R26.81 (ICD-10-CM) - Unstable gait   THERAPY DIAG:  Unsteadiness on feet  Muscle weakness (generalized)  Hemiplegia and hemiparesis following cerebral infarction affecting left non-dominant side (HCC)  Abnormal posture  Rationale for Evaluation and Treatment Rehabilitation  SUBJECTIVE:  SUBJECTIVE STATEMENT: Pt and daughter report no changes, doing well.   Pt accompanied by:  daughter [name]  PERTINENT HISTORY:   PAIN:  Are you having pain? No  PRECAUTIONS: Fall  WEIGHT BEARING RESTRICTIONS No WBAT in R LE   FALLS: Has patient fallen in last 6 months? No  LIVING ENVIRONMENT: Lives with: lives with their family Lives in: House/apartment Stairs: No Has following equipment at home: Environmental consultant - 2 wheeled, Wheelchair (manual), and Ramped entry  PLOF: Independent with basic ADLs and Independent with household mobility without device (before CVA and ankle fracture)  PATIENT GOALS "To get out of this w/c and to stop wearing diapers."     Today's Treatment:   TRANSFERS: Assistive device utilized: Wheelchair (manual)  Sit to stand: Mod A/max A Stand to sit: Mod A Chair to chair: Mod A (squat pivot) Comments: Pt in her transport w/c today with soft cushion.  Performed squat pivot transfer to mat from transport chair at mod A level today with marked improvement in forward weight shift/trunk lean.  She did need increased assist for pivot this morning going to the L, otherwise was mod A throughout. Performed x 2 reps during session with  cues/facilitation for adequate forward/lateral weight shift, cues for keeping head up and safe foot placement as well as hand placement to assist.    NMR:  Performed 2 person (PT assisted) gait today.  PT on her R for RUE and trunk/hip support and PT on her left.  This PT placed her LUE around PTs shoulders (as much as possible to not increase pain in shoulder but to allow support at axilla and encourage upright posture throughout.  Support at pelvis as well to encourage hip extension and also assist with lateral weight shift.  Pt did well taking forward steps today with no knee instability in this manner.  PT on R provided UE support and emphasis on maintaining upright posture throughout.  Performed 2 bouts of 8'.  Attempted a third in between however pt fatigued and needed increased rest break.  Pt also anxious about forward weight shift when standing due to no one in front of her and R hand gripping chair.    Moved to // bars and performed a few steps x 2 reps in bars with RUE support. [name] (daughter) assisting with the stand from behind as they do at home with PT providing cues for increased forward weight shift throughout transition.  Note that in // bars pt needing assist at R knee to prevent buckle when advancing LLE.  She also tends to adduct LEs more in // bars for support.   Therex: Began session on seated nustep (performed squat pivot transfer as above) x 7 mins with BLEs/RUE at level 3 resistance with cues to maintain rpms in the 50's.  Needs continued cues to reach 50's throughout.  Transferred back to chair as above.             PATIENT EDUCATION: Education details: Will do bedside commode transfers at next session.  Person educated: Patient and Child(ren) Education method: Explanation, Demonstration, and Verbal cues Education comprehension: verbalized understanding and needs further education   HOME EXERCISE PROGRAM: Educated to begin working on ADLs (upper body and some lower  body bathing, brushing teeth/hair) seated at EOB with feet supported and S from daughter in order to work on improved trunk activation and upright tolerance/endurance.    Access Code: [email or web address] URL: [email or web address] Date: 03/26/2022 Prepared by: Cameron Sprang  Exercises - Supine  Bridge  - 1 x daily - 7 x weekly - 2 sets - 10 reps - Supine Quadricep Sets  - 1 x daily - 7 x weekly - 2 sets - 10 reps - 5 secs hold - Small Range Straight Leg Raise  - 1 x daily - 7 x weekly - 2 sets - 5 reps - Clamshell  - 1 x daily - 7 x weekly - 2 sets - 10 reps - Seated Hamstring Stretch  - 1 x daily - 7 x weekly - 1 sets - 2 reps - 30 secs hold    GOALS: Goals reviewed with patient? Yes  SHORT TERM GOALS: Target date: 04/10/2022  Pt/daughter will be IND with initial HEP in order to indicate improved functional mobility and dec fall risk. Baseline: Goal status: MET  2.  Pt will perform all aspects of bed mobility at S level in order to indicate improved independence at home.   Baseline: min/guard to light min A when going from SL to sitting Goal status: IN PROGRESS  3.  Pt will perform squat pivot transfers at min A level in order to indicate improved functional mobility.  Baseline: min/mod A  Goal status: IN PROGRESS  4.  Pt will perform sit<>stand to RW at mod A level in order to indicate improved functional mobility and transfers.  Baseline: not safe to stand with RW as of this time Goal status: NOT MET  5.  Pt will perform stand pivot transfers with RW at mod A level in order to indicate improved functional mobility.   Baseline: not safe to do with RW at this time Goal status: NOT MET  6.  Will initiate gait in // bars as able and update LTG as able.  Baseline:  Goal status: MET  7.  Pt will report being able to sit unsupported at home to perform ADLs x 10 mins without LOB.   Baseline:  Goal status: MET   LONG TERM GOALS: Target date: 05/12/2022  Pt/daughter will  be IND with final HEP in order to indicate improved functional mobility and dec fall risk. Baseline: They are performing some, but not all exercises from HEP  Goal status: progressing  2.  Pt will perform all aspects of bed mobility at mod I level in order to indicate improved functional mobility.   Baseline: Pt is min/guard to min A for SL to sit, otherwise is S level  Goal status: IN PROGRESS  3.  Pt will perform squat pivot transfers (to/from w/c and bedside commode/shower chair) at S level in order to indicate improved functional independence.  Baseline: Is consistently Mod A, sometimes Max A Goal status: IN PROGRESS  4.  Pt will perform sit<>stand with RW at min A level in order to indicate improved functional mobility.   Baseline: Have not attempted yet, not appropriate Goal status: NOT MET  5.  Pt will perform stand pivot transfer with RW at min A level in order to indicate improved functional mobility.  Baseline: Not appropriate at this time  Goal status: NOT MET  6.  Will add gait goal as appropriate.  Baseline: Not appropriate at this time Goal status: NOT MET    UPDATED LONG TERM GOALS: Target date: 06/13/2022  Pt/daughter will be IND with final HEP in order to indicate improved functional mobility and dec fall risk. Baseline: They are performing some, but not all exercises from HEP  Goal status: ONGOING   2.  Pt will perform all aspects of  bed mobility at mod I level in order to indicate improved functional mobility.   Baseline: Pt is min/guard to min A for SL to sit, otherwise is S level  Goal status: ONGOING   3.  Pt will perform squat pivot transfers (to/from w/c and bedside commode/shower chair) at min A level in order to indicate improved functional independence.  Baseline: Is consistently Mod A, sometimes Max A Goal status: REVISED  4.  Pt will perform sit<>stand with RW at min A level in order to indicate improved functional mobility.   Baseline: Have not  attempted yet, not appropriate Goal status: ONGOING     ASSESSMENT:  CLINICAL IMPRESSION: Skilled session focused on functional mobility with gait assisted by 2 therapist requiring mod/max A throughout and tolerating up to 8' at a time.  Also continue to work on functional squat pivot transfers which are improving from a forward weight shift stand point, but still needing assist from a pivot standpoint.  Progress has been difficult due to amount of deficits and the safety/routine at which they perform tasks at home.  Will recert pt for another 4 weeks to continue to work towards unmet goals.    OBJECTIVE IMPAIRMENTS Abnormal gait, decreased activity tolerance, decreased balance, decreased endurance, decreased mobility, difficulty walking, decreased ROM, decreased strength, hypomobility, impaired perceived functional ability, impaired flexibility, impaired UE functional use, and postural dysfunction.   ACTIVITY LIMITATIONS carrying, lifting, bending, sitting, standing, squatting, stairs, transfers, bed mobility, bathing, toileting, dressing, and locomotion level  PARTICIPATION LIMITATIONS: meal prep, cleaning, laundry, shopping, and community activity  PERSONAL FACTORS Age, Time since onset of injury/illness/exacerbation, and 3+ comorbidities: see above  are also affecting patient's functional outcome.   REHAB POTENTIAL: Good  CLINICAL DECISION MAKING: Evolving/moderate complexity  EVALUATION COMPLEXITY: Moderate  PLAN: PT FREQUENCY: 2x/week  PT DURATION: 8 weeks  PLANNED INTERVENTIONS: Therapeutic exercises, Therapeutic activity, Neuromuscular re-education, Balance training, Gait training, Patient/Family education, Self Care, Joint mobilization, Orthotic/Fit training, DME instructions, Aquatic Therapy, and Wheelchair mobility training  PLAN FOR NEXT SESSION:  Work on squat pivot transfer to bedside commode (can show them drop arm as well). Continue squat pivot transfers (have just  been doing with transport chair),  perceptual deficits (work on improving R weight shift).  Add some seated exercises to HEP for core strength, core strength (could do some sitting on rockerboard/BOSU), bed mobility, hip and knee flexibility, trunk dissociation, sit<>stand (to RW when able), gait as able in // bars.  I wonder if she could stand to a RW?? Shes familiar with this so maybe a semi automatic task??   Cameron Sprang, PT, MPT [facility] [address] [place], [place], [address] Phone: [phone]   Fax:  [phone] 05/14/22, 9:55 AM

## 2022-05-16 ENCOUNTER — Encounter: Payer: Self-pay | Admitting: Rehabilitation

## 2022-05-16 ENCOUNTER — Ambulatory Visit: Payer: Medicare PPO | Admitting: Rehabilitation

## 2022-05-16 DIAGNOSIS — I69354 Hemiplegia and hemiparesis following cerebral infarction affecting left non-dominant side: Secondary | ICD-10-CM | POA: Diagnosis not present

## 2022-05-16 DIAGNOSIS — M6281 Muscle weakness (generalized): Secondary | ICD-10-CM | POA: Diagnosis not present

## 2022-05-16 DIAGNOSIS — R293 Abnormal posture: Secondary | ICD-10-CM

## 2022-05-16 DIAGNOSIS — R2681 Unsteadiness on feet: Secondary | ICD-10-CM | POA: Diagnosis not present

## 2022-05-16 NOTE — Therapy (Signed)
OUTPATIENT PHYSICAL THERAPY NEURO TREATMENT   Patient Name: [name] MRN: [id number] DOB:Aug 26, 1945, 76 y.o., female Today's Date: 05/16/2022   PCP: Sherrie Mustache, NP REFERRING PROVIDER: Sherrie Mustache, NP   PT End of Session - 05/16/22 0902     Visit Number 13    Number of Visits 20   per updated POC   Date for PT Re-Evaluation 06/13/22   Per updated POC   Authorization Type [employer] (10th visit PN needed)    Progress Note Due on Visit 20    PT Start Time 0846    PT Stop Time 0931    PT Time Calculation (min) 45 min    Activity Tolerance Patient tolerated treatment well;Patient limited by fatigue    Behavior During Therapy [facility] for tasks assessed/performed             Past Medical History:  Diagnosis Date   Allergy    Arthritis    Broken ankle    Cataract    Diabetes mellitus without complication ([place])    History of colonoscopy    Hypertension    Macular degeneration syndrome    with edema---being treated.    Stage 4 chronic kidney disease ([place])    Stroke ([place]) 2019   Past Surgical History:  Procedure Laterality Date   callous removal  Left 2017   located on 1st digit on L foot 2/2 diabetes   EYE SURGERY     Patient Active Problem List   Diagnosis Date Noted   Ankle fracture 10/22/2020   Bimalleolar ankle fracture, right, closed, initial encounter 10/19/2020   Cataracts, bilateral 05/03/2020   Stage 4 chronic kidney disease ([place]) 12/21/2019   Iron deficiency anemia 02/03/2019   Pelvic fracture ([place]) 09/21/2018   Allergic reaction caused by a drug 05/06/2018   Bilateral leg edema 24/26/8341   Embolic stroke ([place]) 96/22/2979   Left hemiparesis ([place])    Diabetes mellitus type 2 in nonobese ([place])    Acute ischemic stroke ([place])    Left arm weakness    Overweight (BMI 25.0-29.9) 02/24/2018   Diabetes mellitus without complication ([place]) 89/21/1941   HTN (hypertension) 09/02/2017    ONSET DATE: CVA 2020, ankle fracture 2022  REFERRING  DIAG: I69.354 (ICD-10-CM) - Hemiplegia and hemiparesis following cerebral infarction affecting left non-dominant side (HCC) M25.371 (ICD-10-CM) - Instability of right ankle joint S82.841P (ICD-10-CM) - Closed bimalleolar fracture of right ankle with malunion, subsequent encounter R26.81 (ICD-10-CM) - Unstable gait   THERAPY DIAG:  Unsteadiness on feet  Muscle weakness (generalized)  Hemiplegia and hemiparesis following cerebral infarction affecting left non-dominant side (HCC)  Abnormal posture  Rationale for Evaluation and Treatment Rehabilitation  SUBJECTIVE:  SUBJECTIVE STATEMENT: Pt and daughter report no changes, doing well.   Pt accompanied by:  daughter [name]  PERTINENT HISTORY:   PAIN:  Are you having pain? No  PRECAUTIONS: Fall  WEIGHT BEARING RESTRICTIONS No WBAT in R LE   FALLS: Has patient fallen in last 6 months? No  LIVING ENVIRONMENT: Lives with: lives with their family Lives in: House/apartment Stairs: No Has following equipment at home: Environmental consultant - 2 wheeled, Wheelchair (manual), and Ramped entry  PLOF: Independent with basic ADLs and Independent with household mobility without device (before CVA and ankle fracture)  PATIENT GOALS "To get out of this w/c and to stop wearing diapers."     Today's Treatment:   TRANSFERS: Assistive device utilized: Wheelchair (manual)  Sit to stand: Min/mod A  Stand to sit: Mod A Chair to chair: Mod A (squat pivot) Comments: Pt in her transport w/c today with soft cushion.  Performed squat pivot transfer to/from nustep at mod A level today with marked improvement in forward weight shift/trunk lean.  She did need increased assist for pivot this morning going to the L, otherwise was mod A throughout. Performed x 2 reps during session with  cues/facilitation for adequate forward/lateral weight shift, cues for keeping head up and safe foot placement as well as hand placement to assist.  Also had her work on sit<>stand and stand pivots to commode chair with use of // bars (pulled up to outside of // bars and placed bedside commode to her L).  She is able to stand at light min A level but does need min/mod A for the pivot towards the commode chair.  Mod/max A from pts daughter back towards transport chair.  Then moved her chair so that // bars were on her R and placed commode chair on her left as if she were getting out of bed going to commode chair.  She did this very well also at min/mod A level for pivot.    PT recommended they begin to work on this at home.  Recommend they sit at EOB and transfer to commode chair then stand again for clothing management.  Also recommend that they doff/don clothing while seated on commode chair, stand to pull up pants/briefs and then transfer to transport chair placed on the L.  Pt and daughter verbalized understanding.    Biggest issue with pivot during session is inability to remain in tall posture and WB on RLE when stepping LLE towards chair.  Therefore ended session in // bars working on standing tall, shifting onto RLE and performing LLE abduction/adduction.  First trial she needed mod/max A esp at R knee to prevent buckle, but with increased practice she is able to stand in markedly improved posture and step LLE with ease! Performed x 3 reps (2 sets)    Therex: Began session on seated nustep (performed squat pivot transfer as above) x 6 mins with BLEs/RUE at level 3 resistance with cues to maintain rpms in the 50's.  Did much better today, even getting into 60's during task.            PATIENT EDUCATION: Education details: Working on transfers to bedside commode at home  Person educated: Patient and Clinical cytogeneticist) Education method: Consulting civil engineer, Media planner, and Verbal cues Education  comprehension: verbalized understanding and needs further education   HOME EXERCISE PROGRAM: Educated to begin working on ADLs (upper body and some lower body bathing, brushing teeth/hair) seated at EOB with feet supported and S from daughter in order to  work on improved trunk activation and upright tolerance/endurance.    Access Code: [email or web address] URL: [email or web address] Date: 03/26/2022 Prepared by: Cameron Sprang  Exercises - Supine Bridge  - 1 x daily - 7 x weekly - 2 sets - 10 reps - Supine Quadricep Sets  - 1 x daily - 7 x weekly - 2 sets - 10 reps - 5 secs hold - Small Range Straight Leg Raise  - 1 x daily - 7 x weekly - 2 sets - 5 reps - Clamshell  - 1 x daily - 7 x weekly - 2 sets - 10 reps - Seated Hamstring Stretch  - 1 x daily - 7 x weekly - 1 sets - 2 reps - 30 secs hold    GOALS: Goals reviewed with patient? Yes   UPDATED LONG TERM GOALS: Target date: 06/13/2022  Pt/daughter will be IND with final HEP in order to indicate improved functional mobility and dec fall risk. Baseline: They are performing some, but not all exercises from HEP  Goal status: ONGOING   2.  Pt will perform all aspects of bed mobility at mod I level in order to indicate improved functional mobility.   Baseline: Pt is min/guard to min A for SL to sit, otherwise is S level  Goal status: ONGOING   3.  Pt will perform squat pivot transfers (to/from w/c and bedside commode/shower chair) at min A level in order to indicate improved functional independence.  Baseline: Is consistently Mod A, sometimes Max A Goal status: REVISED  4.  Pt will perform sit<>stand with RW at min A level in order to indicate improved functional mobility.   Baseline: Have not attempted yet, not appropriate Goal status: ONGOING     ASSESSMENT:  CLINICAL IMPRESSION: Skilled session focused on functional transfers to bedside commode from transport chair with use of // bar to simulate her vertical bar at  home.  I actually feel that transfers will be easier (esp to the R) with her bar at home.  She is able to do in session at min/mod a level.     OBJECTIVE IMPAIRMENTS Abnormal gait, decreased activity tolerance, decreased balance, decreased endurance, decreased mobility, difficulty walking, decreased ROM, decreased strength, hypomobility, impaired perceived functional ability, impaired flexibility, impaired UE functional use, and postural dysfunction.   ACTIVITY LIMITATIONS carrying, lifting, bending, sitting, standing, squatting, stairs, transfers, bed mobility, bathing, toileting, dressing, and locomotion level  PARTICIPATION LIMITATIONS: meal prep, cleaning, laundry, shopping, and community activity  PERSONAL FACTORS Age, Time since onset of injury/illness/exacerbation, and 3+ comorbidities: see above  are also affecting patient's functional outcome.   REHAB POTENTIAL: Good  CLINICAL DECISION MAKING: Evolving/moderate complexity  EVALUATION COMPLEXITY: Moderate  PLAN: PT FREQUENCY: 2x/week  PT DURATION: 8 weeks  PLANNED INTERVENTIONS: Therapeutic exercises, Therapeutic activity, Neuromuscular re-education, Balance training, Gait training, Patient/Family education, Self Care, Joint mobilization, Orthotic/Fit training, DME instructions, Aquatic Therapy, and Wheelchair mobility training  PLAN FOR NEXT SESSION:  Have they been doing transfers to bedside commode? perceptual deficits (work on improving R weight shift).  Add some seated exercises to HEP for core strength, core strength (could do some sitting on rockerboard/BOSU), bed mobility, hip and knee flexibility, trunk dissociation, sit<>stand (to RW when able), gait as able in // bars.  I wonder if she could stand to a RW?? Shes familiar with this so maybe a semi automatic task??   Cameron Sprang, PT, MPT [facility] [address] [place], [place], [address] Phone: [phone]  Fax:   [phone] 05/16/22, 11:13 AM

## 2022-05-21 ENCOUNTER — Other Ambulatory Visit: Payer: Self-pay

## 2022-05-21 ENCOUNTER — Ambulatory Visit: Payer: Medicare PPO | Admitting: Rehabilitation

## 2022-05-21 ENCOUNTER — Ambulatory Visit: Payer: Medicare PPO | Admitting: Physical Therapy

## 2022-05-21 ENCOUNTER — Encounter: Payer: Self-pay | Admitting: Rehabilitation

## 2022-05-21 ENCOUNTER — Encounter: Payer: Self-pay | Admitting: Nurse Practitioner

## 2022-05-21 DIAGNOSIS — R2681 Unsteadiness on feet: Secondary | ICD-10-CM | POA: Diagnosis not present

## 2022-05-21 DIAGNOSIS — I69354 Hemiplegia and hemiparesis following cerebral infarction affecting left non-dominant side: Secondary | ICD-10-CM

## 2022-05-21 DIAGNOSIS — R293 Abnormal posture: Secondary | ICD-10-CM | POA: Diagnosis not present

## 2022-05-21 DIAGNOSIS — M6281 Muscle weakness (generalized): Secondary | ICD-10-CM

## 2022-05-21 MED ORDER — ACCU-CHEK GUIDE VI STRP
ORAL_STRIP | 3 refills | Status: DC
Start: 2022-05-21 — End: 2022-09-04

## 2022-05-21 NOTE — Therapy (Addendum)
OUTPATIENT PHYSICAL THERAPY WHEELCHAIR EVALUATION   Patient Name: [name] MRN: [id number] DOB:07-13-1945, 76 y.o., female Today's Date: 05/22/2022   PT End of Session - 05/22/22 1257     Visit Number 15    Number of Visits 20   per updated POC   Date for PT Re-Evaluation 06/13/22   Per updated POC   Authorization Type [employer] (10th visit PN needed)    Progress Note Due on Visit 20    PT Start Time 1020    PT Stop Time 1125    PT Time Calculation (min) 65 min    Activity Tolerance Patient tolerated treatment well    Behavior During Therapy WFL for tasks assessed/performed             Past Medical History:  Diagnosis Date   Allergy    Arthritis    Broken ankle    Cataract    Diabetes mellitus without complication ([place])    History of colonoscopy    Hypertension    Macular degeneration syndrome    with edema---being treated.    Stage 4 chronic kidney disease ([place])    Stroke ([place]) 2019   Past Surgical History:  Procedure Laterality Date   callous removal  Left 2017   located on 1st digit on L foot 2/2 diabetes   EYE SURGERY     Patient Active Problem List   Diagnosis Date Noted   Ankle fracture 10/22/2020   Bimalleolar ankle fracture, right, closed, initial encounter 10/19/2020   Cataracts, bilateral 05/03/2020   Stage 4 chronic kidney disease ([place]) 12/21/2019   Iron deficiency anemia 02/03/2019   Pelvic fracture ([place]) 09/21/2018   Allergic reaction caused by a drug 05/06/2018   Bilateral leg edema 08/67/6195   Embolic stroke ([place]) 09/32/6712   Left hemiparesis ([place])    Diabetes mellitus type 2 in nonobese ([place])    Acute ischemic stroke ([place])    Left arm weakness    Overweight (BMI 25.0-29.9) 02/24/2018   Diabetes mellitus without complication ([place]) 45/80/9983   HTN (hypertension) 09/02/2017    PCP: Lauree Chandler, NP  REFERRING PROVIDER: Lauree Chandler, NP  REFERRING DIAG:  Diagnosis  [phone] (ICD-10-CM) - Hemiplegia and  hemiparesis following cerebral infarction affecting left non-dominant side (HCC)  M25.371 (ICD-10-CM) - Instability of right ankle joint  S82.841P (ICD-10-CM) - Closed bimalleolar fracture of right ankle with malunion, subsequent encounter  R26.81 (ICD-10-CM) - Unstable gait   Referral Notes   THERAPY DIAG:  Hemiplegia and hemiparesis following cerebral infarction affecting left non-dominant side [facility])  ONSET DATE: August 2019 for CVA; Ankle fracture April 2022  Rationale for Evaluation and Treatment: Rehabilitation  SUBJECTIVE:  SUBJECTIVE STATEMENT: Pt presents for power wheelchair eval accompanied by her daughter, [name]  PERTINENT HISTORY:  CVA August 2019, Rt ankle fracture April 2022  PAIN:  Are you having pain? No   PRECAUTIONS: None  WEIGHT BEARING RESTRICTIONS: No  FALLS:  Has patient fallen in last 6 months?  Fractured Rt ankle in April 2022; no falls since that fall  LIVING ENVIRONMENT: Lives with: lives with their family Lives in: House/apartment Stairs: No Has following equipment at home: bed side commode, Ramped entry, and transfer pole  OCCUPATION: retired  PLOF: Independent prior to CVA  PATIENT GOALS: obtain power wheelchair for independence with mobility in the home   PATIENT INFORMATION: This Evaluation form will serve as the LMN for the following suppliers:  Supplier:  [place] Person:  Casper Harrison Phone:  [phone]   Reason for Referral: Patient/caregiver Goals: Patient was seen for face-to-face evaluation for new power wheelchair.  Also present was    [employer] with [place] to discuss recommendations and wheelchair options.  Further paperwork was completed and sent to vendor.  Patient appears to qualify for power mobility device  at this time per objective findings.   MEDICAL HISTORY: Diagnosis: h/o Rt CVA with Lt hemiparesis:  s/p Rt ankle fracture April 2022 Primary Diagnosis Onset:  August 2019 []Progressive Disease Relevant Past and Future Surgeries:   Height:  5'2" Weight: 150# Explain and recent changes or trends in weight:   Relevant History including falls:  Pt reports no falls     HOME ENVIRONMENT: [x]House  []Condo/town home  []Apartment  []Assisted Living    []Lives Alone [x] Lives with Others                                                    Hours with caregiver:  24 hrs  []Home is accessible to patient            Stairs  [x]Yes [] No     Ramp [x]Yes []No Comments:     COMMUNITY ADL: TRANSPORTATION: [x]Car    []Van    []Public Transportation    []Adapted w/c Lift   []Ambulance   []Other:       []Sits in wheelchair during transport  Employment/School:     Specific requirements pertaining to mobility                                                     Other:                                       FUNCTIONAL/SENSORY PROCESSING SKILLS:  Handedness:   [x]Right     []Left    []NA  Comments:                                 Media planner for Wheeled Mobility [x]Processing Skills are adequate for safe wheelchair operation  Areas of concern than may interfere with safe operation of wheelchair Description of problem   [] Attention to environment     []  Judgment     [] Hearing  [] Vision or visual processing    []Motor Planning  [] Fluctuations in Behavior                                                   VERBAL COMMUNICATION: [x]WFL receptive [x] WFL expressive []Understandable  []Difficult to understand  []non-communicative [] Uses an augmented communication device    CURRENT SEATING / MOBILITY: Current Mobility Base:   []None  []Dependent  [x]Manual  []Scooter  []Power   Type of Control:                       Manufacturer:                         Size:                          Age:                           Current Condition of Mobility Base:        transport wheelchair which was privately purchased - pt is unable to self propel                                                                                                           Current Wheelchair components:                                                                                                                                   Describe posture in present seating system:                                                                            SENSATION and SKIN ISSUES: Sensation [x]Intact []Impaired []Absent   Level of sensation:  Pressure Relief: Able to perform effective pressure relief :   []Yes  [x] No Method:                                                                              If not, Why?:   Lt hemiplegia Pt is dependent for all transfers and mobility                                                                      Skin Issues/Skin Integrity Current Skin Issues   []Yes [x]No  []Intact [] Red area [] Open Area  []Scar Tissue [x]At risk from prolonged sitting  Where                              History of Skin Issues   []Yes [x]No  Where                                         When                                               Hx of skin flap surgeries []Yes [x]No  Where                  When                                                  Limited sitting tolerance []Yes [x]No Hours spent sitting in wheelchair daily:       6-8 hours                                                  Complaint of Pain:  Please describe:              No pain                                                                                               Swelling/Edema:    none  ADL STATUS (in reference to  wheelchair use):  Indep Assist Unable Indep with Equip Not assessed Comments  Dressing                X                                            Dresses from bedside             Eating      X                                                                                                                        Toileting               X                                                     Wears briefs; uses BSC                                                           Bathing                X                                                Has a roll-in shower & grab bar                                                                      Grooming/ Hygiene                                       X                         Performs from wheelchair  Meal Prep                             X                                                                                             IADLS                            X                                                                                      Bowel Management: [x]Continent  []Incontinent  []Accidents Comments:                                                  Bladder Management: [x]Continent  []Incontinent  []Accidents Comments:  occasional urgency incontinence                                             WHEELCHAIR SKILLS: Manual w/c Propulsion: []UE or LE strength and endurance sufficient to participate in ADLs using manual wheelchair Arm :  []left []right  []Both                                   Foot:   []left []right  []Both  Distance:   Operate Scooter: [] Strength, hand grip, balance and transfer appropriate for use []Living environment is accessible for use of scooter  Operate Power w/c:  [x] Std. Joystick   [] Alternative Controls Indep [x] Assist [x] Dependent/ Unable [] N/A [] [x]Safe          [] Functional      Distance: 50'               Bed confined without  wheelchair [x] Yes [] No   STRENGTH/RANGE OF MOTION:  Active Range of Motion Strength  Shoulder    Rt shoulder flexion & abdct. WFL's:  LUE shoulder AROM moves in synergist pattern                                                Rt shoulder 4/5 for flexors & abdct. LUE 2-/5 with synergistic AROM  Elbow           Rt elbow WNL's: Lt elbow flexion & extension approx. 50% AROM                                       RUE 4/5 for elbow;  LUE 3-/5                                                              Wrist/Hand       RUE WFL's:  minimal AROM Lt wrist and hand                                                              Rt wrist & hand WFL's;  Lt wrist 3-/5                                                                    Hip    Rt hip flexion WFL's:  minimal Lt hip active flexion                                                            Rt hip flexors 3+/5:  Lt hip flexors 2+ - 3-/5                                                             Knee      WFL's RLE & LLE                                                            Rt quads 4/5:  Lt quads 4-/5                                                            Ankle Minimal dorsiflexion RLE due to h/o ankle fracture;  approx. 35 degrees LLE dorsiflexion      Rt dorsiflexors 1+/5: Lt dorsiflexors 3-/5  MOBILITY/BALANCE:  [] Patient is totally dependent for mobility                                                                                               Balance Transfers Ambulation  Sitting Balance: Standing Balance: [] Independent [] Independent/Modified Independent  [] WFL     [] [facility] [] Supervision [] Supervision  [x] Uses UE for balance  [] Supervision [] Min Assist [] Ambulates with Assist                           [] Min Assist [x] Min assist [x] Mod Assist [] Ambulates with Device:  [] RW   [] StW   [] Kasandra Knudsen   []                 [] Mod Assist [] Mod assist [x] Max assist   [] Max Assist [] Max assist [] Dependent [] Indep. Short Distance Only  [] Unable [] Unable [] Lift / Sling Required Distance (in feet)                             [] Sliding board [x] Unable to Ambulate: (Explain:  Cardio Status:  [x]Intact  [] Impaired   [] NA                              Respiratory Status:  [x]Intact   []Impaired   []NA                                     Orthotics/Prosthetics:   has [place] AFO on RLE;  Rt foot is externally rotated                                                                      Comments (Address manual vs power w/c vs scooter): Pt is unable to ambulate at this time due to h/o Rt CVA with Lt hemiparesis and also due to h/o Rt ankle fracture with pt's RLE externally rotated.  Pt is wearing custom [place] AFO on RLE.  Pt is unable to functionally & effectively propel a manual wheelchair at this time due to Lt hemiparesis with nonfunctional LUE.  Pt requires a power wheelchair with power tilt to enable pt to independently perform adequate pressure relief in the wheelchair.  She is at high risk of skin breakdown due to her dependency with transfers and mobility.  She is unable to operate a scooter due to her inability to transfer on and off the platform of a scooter  and also due to her inability to maneuver the tiller due to LUE functional  deficits and lack of isolated AROM.                                              Anterior / Posterior Obliquity Rotation-Pelvis  PELVIS    []Neutral  [x] Posterior  [] Anterior     []WFL  [x]Right Elevated  []Left Elevated   [x]WFL  []Right Anterior []  Left Anterior    [] Fixed [x] Partly Flexible [] Flexible  [] Other  [] Fixed  [x] Partly Flexible  [] Flexible [] Other  [] Fixed  [] Partly Flexible  [x] Flexible [] Other  TRUNK []WFL [x]Thoracic Kyphosis []Lumbar Lordosis   [] WFL []Convex Right [x]Convex  Left   [x]c-curve []s-curve []multiple  [x] Neutral [] Left-anterior [] Right-anterior    [x] Fixed [] Flexible [] Partly Flexible       Other  [] Fixed [] Flexible [x] Partly Flexible [] Other  [] Fixed           [] Flexible [x] Partly Flexible [] Other   Position Windswept   HIPS  [x] Neutral [] Abduct [] ADduct [x] Neutral [] Right [] Left       [] Fixed  [] Partly Flexible             [] Dislocated [x] Flexible [] Subluxed    [] Fixed [] Partly Flexible  [x] Flexible [] Other              Foot Positioning Knee Positioning   Knees and  Feet  [] WFL [x]Left []Right [x] [facility] [x]Left [x]Right Rt foot externally rotated  KNEES ROM concerns: ROM concerns:   & Dorsi-Flexed                    []Lt [x]Rt                                  FEET Plantar Flexed                  []Lt []Rt     Inversion                    []Lt []Rt     Eversion                    []Lt []Rt    HEAD [x] Functional [x] Good Head Control   & [] Flexed         [] Extended [] Adequate Head Control   NECK [] Rotated  Lt  [] Lat Flexed Lt [] Rotated  Rt [] Lat Flexed Rt [] Limited Head Control    [] Cervical Hyperextension [] Absent  Head Control    SHOULDERS ELBOWS WRIST& HAND         Left     Right    Left     Right  U/E []Functional  Left            [x]Functional  Right                                 []Fisting             []Fisting     []  elevated Left []depressed  Left []elevated Right []depressed  Right   Lt wrist held in flexion- able to extend to neutral position    []protracted Left []retracted Left []protracted Right []retracted Right []subluxed  Left              []subluxed  Right         Goals for Wheelchair Mobility  [x] Independence with mobility in the home with motor related ADLs (MRADLs)  [x] Independence with MRADLs in the community [] Provide dependent mobility  [] Provide recline     [x]Provide tilt   Goals for Seating system [x] Optimize pressure  distribution [x] Provide support needed to facilitate function or safety [x] Provide corrective forces to assist with maintaining or improving posture [] Accommodate client's posture: current seated postures and positions are not flexible or will not tolerate corrective forces [x] Client to be independent with relieving pressure in the wheelchair []Enhance physiological function such as breathing, swallowing, digestion  Simulation ideas/Equipment trials:                                                                                                State why other equipment was unsuccessful:                                                                           MOBILITY BASE RECOMMENDATIONS and JUSTIFICATION: MOBILITY COMPONENT JUSTIFICATION  Manufacturer:           Model:   Quantum Stretto           Size: Width  16         Seat Depth    19         [x]provide transport from point A to B [x]promote Indep mobility  [x]is not a safe, functional ambulator [x]walker or cane inadequate []non-standard width/depth necessary to accommodate anatomical measurement []                            []Manual Mobility Base []non-functional ambulator    []Scooter/POV  []can safely operate  []can safely transfer   []has adequate trunk stability  []cannot functionally propel manual w/c  [x]Power Mobility Base  [x]non-ambulatory  [x]cannot functionally propel manual wheelchair  [x] cannot functionally and safely operate scooter/POV [x]can safely operate and willing to  []Stroller Base []infant/child  []unable to propel manual wheelchair []allows for growth []non-functional ambulator []non-functional UE []Indep mobility is not a goal at this time  [x]Tilt  []Forward                   [x]Backward                  [x]Powered tilt              []  Manual tilt  [x]change position against gravitational force on head and shoulders  [x]change position for pressure relief/cannot weight shift []transfers   []management of tone [x]rest periods []control edema [x]facilitate postural control  []                                      []Recline  []Power recline on power base []Manual recline on manual base  []accommodate femur to back angle  []bring to full recline for ADL care  []change position for pressure relief/cannot weight shift []rest periods []repositioning for transfers or clothing/diaper /catheter changes []head positioning  []Lighter weight required []self- propulsion  []lifting []                                                []Heavy Duty required []user weight greater than 250# []extreme tone/ over active movement []broken frame on previous chair []                                    [x] Back  [] Angle Adjustable [] Custom molded     Acta Relief                      [x]postural control []control of tone/spasticity []accommodation of range of motion []UE functional control []accommodation for seating system []                                         [x]provide lateral trunk support []accommodate deformity []provide posterior trunk support [x]provide lumbar/sacral support [x]support trunk in midline [x]Pressure relief over spinal processes  [x] Seat Cushion Solution                      []impaired sensation  []decubitus ulcers present []history of pressure ulceration [x]prevent pelvic extension [x]low maintenance  [x]stabilize pelvis  []accommodate obliquity []accommodate multiple deformity [x]neutralize lower extremity position [x]increase pressure distribution [x]     Skin protection & positioning - at risk with prolonged sitting                                  [] Pelvic/thigh support  [x] Lateral thigh guide [] Distal medial pad  [] Distal lateral pad [] pelvis in neutral []accommodate pelvis [x] position upper legs [] alignment [] accommodate ROM [] decrease adduction []accommodate tone []removable for transfers [x]decrease abduction  [] Lateral trunk  Supports [] Lt     [] Rt []decrease lateral trunk leaning []control tone []contour for increased contact []safety  []accommodate asymmetry []                                                [x] Mounting hardware  []lateral trunk supports  [x]back   [x]seat [x]headrest      [x] thigh support []fixed   [x]swing away [x]attach seat platform/cushion to w/c frame [x]attach back  cushion to w/c frame [x]mount postural supports [x]mount headrest  []swing medial thigh support away [x]swing lateral supports away for transfers  []                                                    Armrests  []fixed [x]adjustable height []removable   []swing away  [x]flip back   []reclining [x]full length pads []desk    []pads tubular  [x]provide support with elbow at 90   []provide support for w/c tray [x]change of height/angles for variable activities [x]remove for transfers [x]allow to come closer to table top []remove for access to tables []                                              Hangers/ Leg rests  []60 []70 []90 []elevating []heavy duty  []articulating []fixed []lift off []swing away     []power []provide LE support  []accommodate to hamstring tightness []elevate legs during recline   []provide change in position for Legs []Maintain placement of feet on footplate []durability []enable transfers []decrease edema []Accommodate lower leg length []                                        Foot support Footplate    []Lt  [] Rt  [x] Center mount [x]flip up                            [x]depth/angle adjustable []Amputee adapter    [] Lt     [] Rt [x]provide foot support [x]accommodate to ankle ROM [x]transfers []Provide support for residual extremity [] allow foot to go under wheelchair base [] decrease tone  []                                                [] Ankle strap/heel loops []support foot on foot support []decrease extraneous movement []provide input to heel  []protect foot   Tires: []pneumatic  [x]flat free inserts  []solid  [x]decrease maintenance  [x]prevent frequent flats []increase shock absorbency []decrease pain from road shock []decrease spasms from road shock []                                             [x] Headrest  []provide posterior head support []provide posterior neck support []provide lateral head support []provide anterior head support [x]support during tilt and recline []improve feeding   []improve respiration []placement of switches [x]safety  []accommodate ROM  []accommodate tone []improve visual orientation  [] Anterior chest strap [] Vest [] Shoulder retractors  []decrease forward movement of shoulder []accommodation of TLSO []decrease forward movement of trunk []decrease shoulder elevation []added abdominal support []alignment []assistance with shoulder control  []  Pelvic Positioner [x]Belt []SubASIS bar []Dual Pull []stabilize tone [x]decrease falling out of chair/ **will not Decrease potential for sliding due to pelvic tilting []prevent excessive rotation []pad for protection over boney prominence []prominence comfort []special pull angle to control rotation []                                                 Upper ExtremitySupport  []L   [] R []Arm trough   []hand support [] tray       []full tray []swivel mount []decrease edema      []decrease subluxation   []control tone   []placement for AAC/Computer/EADL []decrease gravitational pull on shoulders []provide midline positioning []provide support to increase UE function []provide hand support in natural position []provide work surface   POWER WHEELCHAIR CONTROLS  [x]Proportional  []Non-Proportional Type                                      []Left  [x]Right [x]provides access for controlling wheelchair   []lacks motor control to operate proportional drive control []unable to understand proportional controls   Actuator Control Module  [x]Single  []Multiple   [x]Allow the client to operate the power seat function(s) through the joystick control   []Safety Reset Switches []Used to change modes and stop the wheelchair when driving in latch mode    []Upgraded Electronics   []programming for accurate control []progressive Disease/changing condition []non-proportional drive control needed []Needed in order to operate power seat functions through joystick control   []Display box []Allows user to see in which mode and drive the wheelchair is set  []necessary for alternate controls    []Digital interface electronics []Allows w/c to operate when using alternative drive controls  []ASL Head Array []Allows client to operate wheelchair  through switches placed in tri-panel headrest  []Sip and puff with tubing kit []needed to operate sip and puff drive controls  []Upgraded tracking electronics []increase safety when driving []correct tracking when on uneven surfaces  [x]Mount for switches or joystick []Attaches switches to w/c  [x]Swing away for access or transfers []midline for optimal placement []provides for consistent access  []Attendant controlled joystick plus mount []safety []long distance driving []operation of seat functions []compliance with transportation regulations []                                            Rear wheel placement/Axle adjustability []None []semi adjustable []fully adjustable  []improved UE access to wheels []improved stability []changing angle in space for improvement of postural stability []1-arm drive access []amputee pad placement []                               Wheel rims/ hand rims  []metal   []plastic coated []oblique projections           []vertical projections []Provide ability to propel manual wheelchair  [] Increase self-propulsion with hand weakness/decreased grasp  Push handles []extended   []angle adjustable              []standard []caregiver  access []caregiver assist []allows "hooking" to enable increased  ability to perform ADLs or maintain balance  One armed device   []Lt   []Rt []enable propulsion of manual wheelchair with one arm   []                                           Brake/wheel lock extension [] Lt   [] Rt []increase indep in applying wheel locks   []Side guards []prevent clothing getting caught in wheel or becoming soiled [] prevent skin tears/abrasions  Battery:        NF22 x 2                                     [x]to power wheelchair                                                         Other:                                                                                                                        The above equipment has a life- long use expectancy. Growth and changes in medical and/or functional conditions would be the exceptions. This is to certify that the therapist has no financial relationship with durable medical provider or manufacturer. The therapist will not receive remuneration of any kind for the equipment recommended in this evaluation.   Patient has mobility limitation that significantly impairs safe, timely participation in one or more mobility related ADL's. (bathing, toileting, feeding, dressing, grooming, moving from room to room)  [x] Yes [] No  Will mobility device sufficiently improve ability to participate and/or be aided in participation of MRADL's?      [x] Yes [] No  Can limitation be compensated for with use of a cane or walker?                                    [] Yes [x] No  Does patient or caregiver demonstrate ability/potential ability & willingness to safely use the mobility device?    [x] Yes [] No  Does patient's home environment support use of recommended mobility device?            [x] Yes [] No  Does patient have sufficient upper extremity function necessary to functionally propel a manual wheelchair?     [] Yes [x] No  Does patient have sufficient strength and  trunk stability to safely operate a POV (scooter)?                                  []  Yes [x] No  Does patient need additional features/benefits provided by a power wheelchair for MRADL's in the home?        [x] Yes [] No  Does the patient demonstrate the ability to safely use a power wheelchair?                   [x] Yes [] No     Physician's Name Printed:       Sherrie Mustache, NP                                                 Physician's Signature:  Date:     This is to certify that I, the above signed therapist have the following affiliations: [] This DME provider [] Manufacturer of recommended equipment [] Patient's long term care facility [x] None of the above  Therapist Name/Signature:    Guido Sander, PT                                          Date:  05-21-22  ASSESSMENT:  CLINICAL IMPRESSION: Patient is a 76 y.o. lady who was seen today for physical therapy evaluation and treatment for power wheelchair evaluation due to h/o Rt CVA with Lt hemiparesis.  Power wheelchair with power tilt recommended to allow pt to independently perform pressure relief and to provide independence with mobility in her home environment. LMN to be completed.    OBJECTIVE IMPAIRMENTS: decreased balance, decreased coordination, decreased mobility, decreased strength, impaired UE functional use, and inability to ambulate .   ACTIVITY LIMITATIONS: bending, standing, stairs, transfers, bathing, toileting, and locomotion level  PARTICIPATION LIMITATIONS: meal prep, cleaning, laundry, shopping, and community activity  PERSONAL FACTORS: Behavior pattern, Past/current experiences, and Time since onset of injury/illness/exacerbation are also affecting patient's functional outcome.   REHAB POTENTIAL: Good  CLINICAL DECISION MAKING: Evolving/moderate complexity  EVALUATION COMPLEXITY: Moderate  PLAN:  PT FREQUENCY: one time visit  PT DURATION: other: w/c eval only  PLANNED INTERVENTIONS:   w/c eval only - [employer] .  PLAN FOR NEXT SESSION: N/A   Alda Lea, PT 05/22/2022, 1:02 PM

## 2022-05-21 NOTE — Therapy (Signed)
OUTPATIENT PHYSICAL THERAPY NEURO TREATMENT   Patient Name: [name] MRN: [id number] DOB:1945/09/26, 76 y.o., female Today's Date: 05/21/2022   PCP: Sherrie Mustache, NP REFERRING PROVIDER: Sherrie Mustache, NP   PT End of Session - 05/21/22 1151     Visit Number 14    Number of Visits 20   per updated POC   Date for PT Re-Evaluation 06/13/22   Per updated POC   Authorization Type [employer] (10th visit PN needed)    Progress Note Due on Visit 20    PT Start Time 0931    PT Stop Time 1015    PT Time Calculation (min) 44 min    Activity Tolerance Patient tolerated treatment well;Patient limited by fatigue    Behavior During Therapy [facility] for tasks assessed/performed             Past Medical History:  Diagnosis Date   Allergy    Arthritis    Broken ankle    Cataract    Diabetes mellitus without complication ([place])    History of colonoscopy    Hypertension    Macular degeneration syndrome    with edema---being treated.    Stage 4 chronic kidney disease ([place])    Stroke ([place]) 2019   Past Surgical History:  Procedure Laterality Date   callous removal  Left 2017   located on 1st digit on L foot 2/2 diabetes   EYE SURGERY     Patient Active Problem List   Diagnosis Date Noted   Ankle fracture 10/22/2020   Bimalleolar ankle fracture, right, closed, initial encounter 10/19/2020   Cataracts, bilateral 05/03/2020   Stage 4 chronic kidney disease ([place]) 12/21/2019   Iron deficiency anemia 02/03/2019   Pelvic fracture ([place]) 09/21/2018   Allergic reaction caused by a drug 05/06/2018   Bilateral leg edema 23/95/3202   Embolic stroke ([place]) 33/43/5686   Left hemiparesis ([place])    Diabetes mellitus type 2 in nonobese ([place])    Acute ischemic stroke ([place])    Left arm weakness    Overweight (BMI 25.0-29.9) 02/24/2018   Diabetes mellitus without complication ([place]) 16/83/7290   HTN (hypertension) 09/02/2017    ONSET DATE: CVA 2020, ankle fracture 2022  REFERRING  DIAG: I69.354 (ICD-10-CM) - Hemiplegia and hemiparesis following cerebral infarction affecting left non-dominant side (HCC) 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   Pt accompanied by:  daughter [name]  PERTINENT HISTORY:   PAIN:  Are you having pain? No  PRECAUTIONS: Fall  WEIGHT BEARING RESTRICTIONS No WBAT in R LE   FALLS: Has patient fallen in last 6 months? No  LIVING ENVIRONMENT: Lives with: lives with their family Lives in: House/apartment Stairs: No Has following equipment at home: Environmental consultant - 2 wheeled, Wheelchair (manual), and Ramped entry  PLOF: Independent with basic ADLs and Independent with household mobility without device (before CVA and ankle fracture)  PATIENT GOALS "To get out of this w/c and to stop wearing diapers."     Today's Treatment:   TRANSFERS: Assistive device utilized: Wheelchair (manual)  Sit to stand: Min/mod A  Stand to sit: Mod A Chair to chair: Mod A (squat pivot) Comments: Pt in her transport w/c today with soft cushion.  Performed squat pivot transfer to/from nustep at mod/max A level today with continued improvement in forward weight shift/trunk lean.  She still needs slightly more assist going to the L, otherwise was mod A throughout. Performed x 2 reps during session with  cues/facilitation for adequate forward/lateral weight shift, cues for keeping head up and safe foot placement as well as hand placement to assist.   Standing/stepping in // bars:  Performed 4-5 reps of sit<>stand in // bars at min/mod A level (pt more fatigued towards ends of reps needing more mod A) with cues and facilitation for forward weight shift as standing and as sitting as well.  Verbal and tactile cues for upright posture throughout at pelvis at chest.  Had her utilize mirror for improved R lateral weight shift as she tends to stay forward flexed towards the L.  She is able to obtain brief periods of upright posture on R LE however is fleeting and not adequate enough for full L step today.  We did take several steps (2 bouts) during session in // bars.  Pt fatigues quickly today needing several seated rest breaks.     Therex: Began session on seated nustep (performed squat pivot transfer as above) x 8 mins with BLEs/RUE at level 3 resistance with cues to maintain rpms in the 50's-60's.           PATIENT EDUCATION: Education details: Continue working on transfers to bedside commode at home  Person educated: Patient and Clinical cytogeneticist) Education method: Consulting civil engineer, Media planner, and Verbal cues Education comprehension: verbalized understanding and needs further education   HOME EXERCISE PROGRAM: Educated to begin working on ADLs (upper body and some lower body bathing, brushing teeth/hair) seated at EOB with feet supported and S from daughter in order to work on improved trunk activation and upright tolerance/endurance.    Access Code: [email or web address] URL: [email or web address] Date: 03/26/2022 Prepared by: Cameron Sprang  Exercises - Supine Bridge  - 1 x daily - 7 x weekly - 2 sets - 10 reps - Supine Quadricep Sets  - 1 x daily - 7 x weekly - 2 sets - 10 reps - 5 secs hold - Small Range Straight Leg Raise  - 1 x daily - 7 x weekly - 2 sets - 5 reps - Clamshell  - 1 x  daily - 7 x weekly - 2 sets - 10 reps - Seated Hamstring Stretch  - 1 x daily - 7 x weekly - 1 sets - 2 reps - 30 secs hold    GOALS: Goals reviewed with patient? Yes   UPDATED LONG  TERM GOALS: Target date: 06/13/2022  Pt/daughter will be IND with final HEP in order to indicate improved functional mobility and dec fall risk. Baseline: They are performing some, but not all exercises from HEP  Goal status: ONGOING   2.  Pt will perform all aspects of bed mobility at mod I level in order to indicate improved functional mobility.   Baseline: Pt is min/guard to min A for SL to sit, otherwise is S level  Goal status: ONGOING   3.  Pt will perform squat pivot transfers (to/from w/c and bedside commode/shower chair) at min A level in order to indicate improved functional independence.  Baseline: Is consistently Mod A, sometimes Max A Goal status: REVISED  4.  Pt will perform sit<>stand with RW at min A level in order to indicate improved functional mobility.   Baseline: Have not attempted yet, not appropriate Goal status: ONGOING     ASSESSMENT:  CLINICAL IMPRESSION: Skilled session continues to focus on sit<>stand, standing for more midline posture, improving R lateral weight shift, and taking steps as able while maintaining upright posture.  Pt had more difficulty today with R lateral weight shift and was unable to take adequate L step in session.  We discussed taking a break after first week of December and then picking back up in Jan to add aquatic PT to [place].  Pt and daughter verbalized agreement.     OBJECTIVE IMPAIRMENTS Abnormal gait, decreased activity tolerance, decreased balance, decreased endurance, decreased mobility, difficulty walking, decreased ROM, decreased strength, hypomobility, impaired perceived functional ability, impaired flexibility, impaired UE functional use, and postural dysfunction.   ACTIVITY LIMITATIONS carrying, lifting, bending, sitting, standing,  squatting, stairs, transfers, bed mobility, bathing, toileting, dressing, and locomotion level  PARTICIPATION LIMITATIONS: meal prep, cleaning, laundry, shopping, and community activity  PERSONAL FACTORS Age, Time since onset of injury/illness/exacerbation, and 3+ comorbidities: see above  are also affecting patient's functional outcome.   REHAB POTENTIAL: Good  CLINICAL DECISION MAKING: Evolving/moderate complexity  EVALUATION COMPLEXITY: Moderate  PLAN: PT FREQUENCY: 2x/week  PT DURATION: 8 weeks  PLANNED INTERVENTIONS: Therapeutic exercises, Therapeutic activity, Neuromuscular re-education, Balance training, Gait training, Patient/Family education, Self Care, Joint mobilization, Orthotic/Fit training, DME instructions, Aquatic Therapy, and Wheelchair mobility training  PLAN FOR NEXT SESSION:  Bedside commode transfers going ok at home?  perceptual deficits (work on improving R weight shift).  Add some seated exercises to HEP for core strength, core strength (could do some sitting on rockerboard/BOSU), bed mobility, hip and knee flexibility, trunk dissociation, sit<>stand (to RW when able), gait as able in // bars.  I wonder if she could stand to a RW?? Shes familiar with this so maybe a semi automatic task??   Cameron Sprang, PT, MPT [facility] [address] [employer] [place], [place], [address] Phone: [phone]   Fax:  [phone] 05/21/22, 11:51 AM

## 2022-05-21 NOTE — Progress Notes (Signed)
This encounter was created in error - please disregard.

## 2022-05-23 ENCOUNTER — Ambulatory Visit: Payer: Medicare PPO | Admitting: Rehabilitation

## 2022-05-23 ENCOUNTER — Encounter: Payer: Self-pay | Admitting: Rehabilitation

## 2022-05-23 DIAGNOSIS — M6281 Muscle weakness (generalized): Secondary | ICD-10-CM | POA: Diagnosis not present

## 2022-05-23 DIAGNOSIS — R293 Abnormal posture: Secondary | ICD-10-CM

## 2022-05-23 DIAGNOSIS — R2681 Unsteadiness on feet: Secondary | ICD-10-CM | POA: Diagnosis not present

## 2022-05-23 DIAGNOSIS — I69354 Hemiplegia and hemiparesis following cerebral infarction affecting left non-dominant side: Secondary | ICD-10-CM

## 2022-05-23 NOTE — Therapy (Signed)
OUTPATIENT PHYSICAL THERAPY NEURO TREATMENT   Patient Name: [name] MRN: [id number] DOB:1945-10-24, 76 y.o., female Today's Date: 05/23/2022   PCP: Sherrie Mustache, NP REFERRING PROVIDER: Sherrie Mustache, NP   PT End of Session - 05/23/22 0937     Visit Number 16    Number of Visits 20   per updated POC   Date for PT Re-Evaluation 06/13/22   Per updated POC   Authorization Type [employer] (10th visit PN needed)    Progress Note Due on Visit 20    PT Start Time 0930    PT Stop Time 1015    PT Time Calculation (min) 45 min    Activity Tolerance Patient tolerated treatment well    Behavior During Therapy WFL for tasks assessed/performed             Past Medical History:  Diagnosis Date   Allergy    Arthritis    Broken ankle    Cataract    Diabetes mellitus without complication ([place])    History of colonoscopy    Hypertension    Macular degeneration syndrome    with edema---being treated.    Stage 4 chronic kidney disease ([place])    Stroke ([place]) 2019   Past Surgical History:  Procedure Laterality Date   callous removal  Left 2017   located on 1st digit on L foot 2/2 diabetes   EYE SURGERY     Patient Active Problem List   Diagnosis Date Noted   Ankle fracture 10/22/2020   Bimalleolar ankle fracture, right, closed, initial encounter 10/19/2020   Cataracts, bilateral 05/03/2020   Stage 4 chronic kidney disease ([place]) 12/21/2019   Iron deficiency anemia 02/03/2019   Pelvic fracture ([place]) 09/21/2018   Allergic reaction caused by a drug 05/06/2018   Bilateral leg edema 34/19/6222   Embolic stroke ([place]) 97/98/9211   Left hemiparesis ([place])    Diabetes mellitus type 2 in nonobese ([place])    Acute ischemic stroke ([place])    Left arm weakness    Overweight (BMI 25.0-29.9) 02/24/2018   Diabetes mellitus without complication ([place]) 94/17/4081   HTN (hypertension) 09/02/2017    ONSET DATE: CVA 2020, ankle fracture 2022  REFERRING DIAG: I69.354 (ICD-10-CM)  - 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   Pt accompanied by:  daughter [name]  PERTINENT HISTORY:   PAIN:  Are you having pain? No  PRECAUTIONS: Fall  WEIGHT BEARING RESTRICTIONS No WBAT in R LE   FALLS: Has patient fallen in last 6 months? No  LIVING ENVIRONMENT: Lives with: lives with their family Lives in: House/apartment Stairs: No Has following equipment at home: Environmental consultant - 2 wheeled, Wheelchair (manual), and Ramped entry  PLOF: Independent with basic ADLs and Independent with household mobility without device (before CVA and ankle fracture)  PATIENT GOALS "To get out of this w/c and to stop wearing diapers."     Today's Treatment:   TRANSFERS: Assistive device utilized: Wheelchair (manual)  Sit to stand: Min/mod A  Stand to sit: Mod A Chair to chair: Mod A (squat pivot) Comments: Pt in her transport w/c today with soft cushion.  Performed squat pivot transfer to/from nustep at mod/max A level today with continued improvement in forward weight shift/trunk lean.  She still needs slightly more assist going to the L, otherwise was mod A throughout. Performed x 2 reps during session with cues/facilitation for adequate  forward/lateral weight shift, cues for keeping head up and safe foot placement as well as hand placement to assist.     Utilized standing frame during session to work on midline WB, lateral weight shifting, hip and LE stretching.  Performed x 3 reps in session with +2 initially to ensure sling in proper placement when standing.  While in standing worked on her using LEs to activate into more upright stance x 10 reps, lateral weight shifting x 10 reps, and working on more upright posture, even in relaxed position.  Note that she did have some R knee pain which seemed better when pad lowered.  Also had some L hip pain however feel that this is due to amount of stretch in hip flexor.  Demo'd on daughter how they can do SL hip stretch at home over break.     Therex: Began session on seated nustep (performed squat pivot transfer as above) x 8 mins with BLEs/RUE at level 4 resistance with cues to maintain rpms in the 50's-60's.           PATIENT EDUCATION: Education details: Continue working on transfers to bedside commode at home  Person educated: Patient and Clinical cytogeneticist) Education method: Consulting civil engineer, Media planner, and Verbal cues Education comprehension: verbalized understanding and needs further education   HOME EXERCISE PROGRAM: Educated to begin working on ADLs (upper body and some lower body bathing, brushing teeth/hair) seated at EOB with feet supported and S from daughter in order to work on improved trunk activation and upright tolerance/endurance.    Access Code: [email or web address] URL: [email or web address] Date: 03/26/2022 Prepared by: Cameron Sprang  Exercises - Supine Bridge  - 1 x daily - 7 x weekly - 2 sets - 10 reps - Supine Quadricep Sets  - 1 x daily - 7 x weekly - 2 sets - 10 reps - 5 secs hold - Small Range Straight Leg Raise  - 1 x daily - 7 x weekly - 2 sets - 5 reps - Clamshell  - 1 x daily - 7 x weekly - 2 sets - 10 reps - Seated Hamstring Stretch  - 1 x daily  - 7 x weekly - 1 sets - 2 reps - 30 secs hold    GOALS: Goals reviewed with patient? Yes   UPDATED LONG TERM GOALS: Target  date: 06/13/2022  Pt/daughter will be IND with final HEP in order to indicate improved functional mobility and dec fall risk. Baseline: They are performing some, but not all exercises from HEP  Goal status: ONGOING   2.  Pt will perform all aspects of bed mobility at mod I level in order to indicate improved functional mobility.   Baseline: Pt is min/guard to min A for SL to sit, otherwise is S level  Goal status: ONGOING   3.  Pt will perform squat pivot transfers (to/from w/c and bedside commode/shower chair) at min A level in order to indicate improved functional independence.  Baseline: Is consistently Mod A, sometimes Max A Goal status: REVISED  4.  Pt will perform sit<>stand with RW at min A level in order to indicate improved functional mobility.   Baseline: Have not attempted yet, not appropriate Goal status: ONGOING     ASSESSMENT:  CLINICAL IMPRESSION: Skilled session focused on warm up on nustep for BLE strengthening and stretching prior to focus on standing in standing frame.  Pt did have some hip and knee pain but overall tolerated well.   OBJECTIVE IMPAIRMENTS Abnormal gait, decreased activity tolerance, decreased balance, decreased endurance, decreased mobility, difficulty walking, decreased ROM, decreased strength, hypomobility, impaired perceived functional ability, impaired flexibility, impaired UE functional use, and postural dysfunction.   ACTIVITY LIMITATIONS carrying, lifting, bending, sitting, standing, squatting, stairs, transfers, bed mobility, bathing, toileting, dressing, and locomotion level  PARTICIPATION LIMITATIONS: meal prep, cleaning, laundry, shopping, and community activity  PERSONAL FACTORS Age, Time since onset of injury/illness/exacerbation, and 3+ comorbidities: see above  are also affecting patient's functional  outcome.   REHAB POTENTIAL: Good  CLINICAL DECISION MAKING: Evolving/moderate complexity  EVALUATION COMPLEXITY: Moderate  PLAN: PT FREQUENCY: 2x/week  PT DURATION: 8 weeks  PLANNED INTERVENTIONS: Therapeutic exercises, Therapeutic activity, Neuromuscular re-education, Balance training, Gait training, Patient/Family education, Self Care, Joint mobilization, Orthotic/Fit training, DME instructions, Aquatic Therapy, and Wheelchair mobility training  PLAN FOR NEXT SESSION:  Try prone for hip stretch.  perceptual deficits (work on improving R weight shift).  Add some seated exercises to HEP for core strength, core strength (could do some sitting on rockerboard/BOSU), bed mobility, hip and knee flexibility, trunk dissociation, sit<>stand (to RW when able), gait as able in // bars.  I wonder if she could stand to a RW?? Shes familiar with this so maybe a semi automatic task??   Cameron Sprang, PT, MPT [facility] [address] [place], [place], [address] Phone: [phone]   Fax:  [phone] 05/23/22, 12:59 PM

## 2022-06-04 ENCOUNTER — Encounter: Payer: Self-pay | Admitting: Rehabilitation

## 2022-06-04 ENCOUNTER — Ambulatory Visit: Payer: Medicare PPO | Admitting: Rehabilitation

## 2022-06-04 DIAGNOSIS — R2681 Unsteadiness on feet: Secondary | ICD-10-CM

## 2022-06-04 DIAGNOSIS — R293 Abnormal posture: Secondary | ICD-10-CM | POA: Diagnosis not present

## 2022-06-04 DIAGNOSIS — I69354 Hemiplegia and hemiparesis following cerebral infarction affecting left non-dominant side: Secondary | ICD-10-CM | POA: Diagnosis not present

## 2022-06-04 DIAGNOSIS — M6281 Muscle weakness (generalized): Secondary | ICD-10-CM | POA: Diagnosis not present

## 2022-06-04 NOTE — Therapy (Signed)
OUTPATIENT PHYSICAL THERAPY NEURO TREATMENT   Patient Name: [name] MRN: [id number] DOB:03-Nov-1945, 76 y.o., female Today's Date: 06/04/2022   PCP: Sherrie Mustache, NP REFERRING PROVIDER: Sherrie Mustache, NP   PT End of Session - 06/04/22 0853     Visit Number 17    Number of Visits 20   per updated POC   Date for PT Re-Evaluation 06/13/22   Per updated POC   Authorization Type [employer] (10th visit PN needed)    Progress Note Due on Visit 20    PT Start Time [phone]   pt arrived late   PT Stop Time 0932    PT Time Calculation (min) 41 min    Activity Tolerance Patient tolerated treatment well    Behavior During Therapy [facility] for tasks assessed/performed             Past Medical History:  Diagnosis Date   Allergy    Arthritis    Broken ankle    Cataract    Diabetes mellitus without complication ([place])    History of colonoscopy    Hypertension    Macular degeneration syndrome    with edema---being treated.    Stage 4 chronic kidney disease ([place])    Stroke ([place]) 2019   Past Surgical History:  Procedure Laterality Date   callous removal  Left 2017   located on 1st digit on L foot 2/2 diabetes   EYE SURGERY     Patient Active Problem List   Diagnosis Date Noted   Ankle fracture 10/22/2020   Bimalleolar ankle fracture, right, closed, initial encounter 10/19/2020   Cataracts, bilateral 05/03/2020   Stage 4 chronic kidney disease ([place]) 12/21/2019   Iron deficiency anemia 02/03/2019   Pelvic fracture ([place]) 09/21/2018   Allergic reaction caused by a drug 05/06/2018   Bilateral leg edema 49/70/2637   Embolic stroke ([place]) 85/88/5027   Left hemiparesis ([place])    Diabetes mellitus type 2 in nonobese ([place])    Acute ischemic stroke ([place])    Left arm weakness    Overweight (BMI 25.0-29.9) 02/24/2018   Diabetes mellitus without complication ([place]) 74/06/8785   HTN (hypertension) 09/02/2017    ONSET DATE: CVA 2020, ankle fracture 2022  REFERRING DIAG:  I69.354 (ICD-10-CM) - Hemiplegia and hemiparesis following cerebral infarction affecting left non-dominant side (HCC) M25.371 (ICD-10-CM) - Instability of right ankle joint S82.841P (ICD-10-CM) - Closed bimalleolar fracture of right ankle with malunion, subsequent encounter R26.81 (ICD-10-CM) - Unstable gait   THERAPY DIAG:  Hemiplegia and hemiparesis following cerebral infarction affecting left non-dominant side (HCC)  Unsteadiness on feet  Abnormal posture  Rationale for Evaluation and Treatment Rehabilitation  SUBJECTIVE:  SUBJECTIVE STATEMENT: Pt and daughter report no changes since last visit.   Pt accompanied by:  daughter [name]  PERTINENT HISTORY:   PAIN:  Are you having pain? No  PRECAUTIONS: Fall  WEIGHT BEARING RESTRICTIONS No WBAT in R LE   FALLS: Has patient fallen in last 6 months? No  LIVING ENVIRONMENT: Lives with: lives with their family Lives in: House/apartment Stairs: No Has following equipment at home: Environmental consultant - 2 wheeled, Wheelchair (manual), and Ramped entry  PLOF: Independent with basic ADLs and Independent with household mobility without device (before CVA and ankle fracture)  PATIENT GOALS "To get out of this w/c and to stop wearing diapers."     Today's Treatment:   TRANSFERS: Assistive device utilized: Wheelchair (manual)  Sit to stand: Min/mod A (in // bars) Stand to sit: Mod A Chair to chair: Mod/max A (squat pivot) Comments: Pt in her transport w/c today with soft cushion.  Performed squat pivot transfer to/from mat at mod/max A level today with continued improvement in forward weight shift/trunk lean.  She still needs slightly more assist going to the L, otherwise was mod A throughout. Performed x 2 reps during session with cues/facilitation for adequate  forward/lateral weight shift, cues for keeping head up and safe foot placement as well as hand placement to assist.       Once on mat, had pt lie to her left side today with min A to guide trunk to mat and protect L UE.  She is able to manage LEs on her own.  While in supine, performed bridging x 10 reps with 3 sec hold, active assisted lower trunk rotation x 10 reps with PT providing slight over pressure at end range for increased stretch.  Then had pt roll to R SL>prone with min A.  Pt tolerated well.  Remained in position x 3 mins.  Attempted to have her try a press up on elbows however this caused L shoulder pain so discontinued this activity.  Encouraged them to try this at home on bed and can elevate head/foot of bed for increased stretch in hips.    Ended session in // bars with gait forward x 8' (without stopping today!) with mod A and facilitation for improved R lateral weight shift and to prevent R knee from buckling.  Tactile cuing at R hip for improved activation in stance phase of gait for improved L step.  Then moved outside of bars and performed 2 sets of side stepping (first x 4 steps, second x 2 steps) again with assist at pelvis to maintain improved hip extension and also for improved R lateral weight shift.  I feel her difficulty with weight shift still persists due to perceptual deficits as she does not complain of pain in R ankle.             PATIENT EDUCATION: Education details: Continue working on transfers to bedside commode at home  Person educated: Patient and Clinical cytogeneticist) Education method: Consulting civil engineer, Media planner, and Verbal cues Education comprehension: verbalized understanding and needs further education   HOME EXERCISE PROGRAM: Educated to begin working on ADLs (upper body and some lower body bathing, brushing teeth/hair) seated at EOB with feet supported and S from daughter in order to work on improved trunk activation and upright tolerance/endurance.     Access Code: [email or web address] URL: [email or web address] Date: 03/26/2022 Prepared by: Cameron Sprang  Exercises - Supine Bridge  - 1 x daily - 7 x weekly - 2 sets - 10 reps - Supine  Quadricep Sets  - 1 x daily - 7 x weekly - 2 sets - 10 reps - 5 secs hold - Small Range Straight Leg Raise  - 1 x daily - 7 x weekly - 2 sets - 5 reps - Clamshell  - 1 x daily - 7 x weekly - 2 sets - 10 reps - Seated Hamstring Stretch  - 1 x daily - 7 x weekly - 1 sets - 2 reps - 30 secs hold    GOALS: Goals reviewed with patient? Yes   UPDATED LONG TERM GOALS: Target date: 06/13/2022  Pt/daughter will be IND with final HEP in order to indicate improved functional mobility and dec fall risk. Baseline: They are performing some, but not all exercises from HEP  Goal status: ONGOING   2.  Pt will perform all aspects of bed mobility at mod I level in order to indicate improved functional mobility.   Baseline: Pt is min/guard to min A for SL to sit, otherwise is S level  Goal status: ONGOING   3.  Pt will perform squat pivot transfers (to/from w/c and bedside commode/shower chair) at min A level in order to indicate improved functional independence.  Baseline: Is consistently Mod A, sometimes Max A Goal status: REVISED  4.  Pt will perform sit<>stand with RW at min A level in order to indicate improved functional mobility.   Baseline: Have not attempted yet, not appropriate Goal status: ONGOING     ASSESSMENT:  CLINICAL IMPRESSION: Pt able to tolerate getting into prone today on mat for sustained hip flexor stretch.  Educated on how to do at home and can elevate head/foot depending on her position for increased stretch.  PT may try on wedge at next session.  She was also able to walk almost full length of // bars today with mod A.   OBJECTIVE IMPAIRMENTS Abnormal gait, decreased activity tolerance, decreased balance, decreased endurance, decreased mobility, difficulty walking, decreased ROM,  decreased strength, hypomobility, impaired perceived functional ability, impaired flexibility, impaired UE functional use, and postural dysfunction.   ACTIVITY LIMITATIONS carrying, lifting, bending, sitting, standing, squatting, stairs, transfers, bed mobility, bathing, toileting, dressing, and locomotion level  PARTICIPATION LIMITATIONS: meal prep, cleaning, laundry, shopping, and community activity  PERSONAL FACTORS Age, Time since onset of injury/illness/exacerbation, and 3+ comorbidities: see above  are also affecting patient's functional outcome.   REHAB POTENTIAL: Good  CLINICAL DECISION MAKING: Evolving/moderate complexity  EVALUATION COMPLEXITY: Moderate  PLAN: PT FREQUENCY: 2x/week  PT DURATION: 8 weeks  PLANNED INTERVENTIONS: Therapeutic exercises, Therapeutic activity, Neuromuscular re-education, Balance training, Gait training, Patient/Family education, Self Care, Joint mobilization, Orthotic/Fit training, DME instructions, Aquatic Therapy, and Wheelchair mobility training  PLAN FOR NEXT SESSION:  Try prone for hip stretch over wedge.  perceptual deficits (work on improving R weight shift).  Add some seated exercises to HEP for core strength, core strength (could do some sitting on rockerboard/BOSU), bed mobility, hip and knee flexibility, trunk dissociation, sit<>stand (to RW when able), gait as able in // bars.  I wonder if she could stand to a RW?? Shes familiar with this so maybe a semi automatic task??   Cameron Sprang, PT, MPT [facility] [address] [place], [place], [address] Phone: [phone]   Fax:  [phone] 06/04/22, 9:40 AM

## 2022-06-05 ENCOUNTER — Encounter: Payer: Self-pay | Admitting: Rehabilitation

## 2022-06-05 ENCOUNTER — Ambulatory Visit: Payer: Medicare PPO | Admitting: Rehabilitation

## 2022-06-05 DIAGNOSIS — R293 Abnormal posture: Secondary | ICD-10-CM | POA: Diagnosis not present

## 2022-06-05 DIAGNOSIS — R2681 Unsteadiness on feet: Secondary | ICD-10-CM

## 2022-06-05 DIAGNOSIS — M9901 Segmental and somatic dysfunction of cervical region: Secondary | ICD-10-CM | POA: Diagnosis not present

## 2022-06-05 DIAGNOSIS — M6281 Muscle weakness (generalized): Secondary | ICD-10-CM

## 2022-06-05 DIAGNOSIS — M5459 Other low back pain: Secondary | ICD-10-CM | POA: Diagnosis not present

## 2022-06-05 DIAGNOSIS — M9903 Segmental and somatic dysfunction of lumbar region: Secondary | ICD-10-CM | POA: Diagnosis not present

## 2022-06-05 DIAGNOSIS — I69354 Hemiplegia and hemiparesis following cerebral infarction affecting left non-dominant side: Secondary | ICD-10-CM

## 2022-06-05 DIAGNOSIS — M9902 Segmental and somatic dysfunction of thoracic region: Secondary | ICD-10-CM | POA: Diagnosis not present

## 2022-06-05 NOTE — Therapy (Signed)
OUTPATIENT PHYSICAL THERAPY NEURO TREATMENT   Patient Name: [name] MRN: [id number] DOB:06/26/1946, 76 y.o., female Today's Date: 06/05/2022   PCP: Sherrie Mustache, NP REFERRING PROVIDER: Sherrie Mustache, NP   PT End of Session - 06/05/22 0947     Visit Number 18    Number of Visits 20   per updated POC   Date for PT Re-Evaluation 06/13/22   Per updated POC   Authorization Type [employer] (10th visit PN needed)    Progress Note Due on Visit 20    PT Start Time 0804    PT Stop Time 0845    PT Time Calculation (min) 41 min    Activity Tolerance Patient tolerated treatment well    Behavior During Therapy WFL for tasks assessed/performed             Past Medical History:  Diagnosis Date   Allergy    Arthritis    Broken ankle    Cataract    Diabetes mellitus without complication ([place])    History of colonoscopy    Hypertension    Macular degeneration syndrome    with edema---being treated.    Stage 4 chronic kidney disease ([place])    Stroke ([place]) 2019   Past Surgical History:  Procedure Laterality Date   callous removal  Left 2017   located on 1st digit on L foot 2/2 diabetes   EYE SURGERY     Patient Active Problem List   Diagnosis Date Noted   Ankle fracture 10/22/2020   Bimalleolar ankle fracture, right, closed, initial encounter 10/19/2020   Cataracts, bilateral 05/03/2020   Stage 4 chronic kidney disease ([place]) 12/21/2019   Iron deficiency anemia 02/03/2019   Pelvic fracture ([place]) 09/21/2018   Allergic reaction caused by a drug 05/06/2018   Bilateral leg edema 35/00/9381   Embolic stroke ([place]) 82/99/3716   Left hemiparesis ([place])    Diabetes mellitus type 2 in nonobese ([place])    Acute ischemic stroke ([place])    Left arm weakness    Overweight (BMI 25.0-29.9) 02/24/2018   Diabetes mellitus without complication ([place]) 96/78/9381   HTN (hypertension) 09/02/2017    ONSET DATE: CVA 2020, ankle fracture 2022  REFERRING DIAG: I69.354 (ICD-10-CM)  - 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   Pt accompanied by:  daughter [name]  PERTINENT HISTORY:   PAIN:  Are you having pain? No  PRECAUTIONS: Fall  WEIGHT BEARING RESTRICTIONS No WBAT in R LE   FALLS: Has patient fallen in last 6 months? No  LIVING ENVIRONMENT: Lives with: lives with their family Lives in: House/apartment Stairs: No Has following equipment at home: Environmental consultant - 2 wheeled, Wheelchair (manual), and Ramped entry  PLOF: Independent with basic ADLs and Independent with household mobility without device (before CVA and ankle fracture)  PATIENT GOALS "To get out of this w/c and to stop wearing diapers."     Today's Treatment:   TRANSFERS: Assistive device utilized: Wheelchair (manual)  Sit to stand: Max A Stand to sit: Max A Chair to chair: max A (squat pivot) Comments: Pt in her transport w/c today with soft cushion.  Performed squat pivot transfer to/from nustep at max A level today with continued cuing for forward weight shift/trunk lean.  She still needs slightly more assist going to the L, and requires more max A for tasks today.  Performed x 2 reps during session with cues/facilitation for adequate  forward/lateral weight shift, cues for keeping head up and safe foot placement as well as hand placement to assist.  Sit to stand performed at mat at max A level with heavy cues and facilitation for forward weight shift.  Once standing, is able to stand with CGA.      Performed sit<>stand x 2 reps facing elevated therapy mat (with 6" and airex) to build height.  She did very well during first rep and was able to perform R hip abd x 15 reps with cues for slower speed and keeping leg in air until back to midline.  Attempted again, however pt had too much L hip pain today despite cues for improved R lateral weight shift and WB.  Attempted again facing // bars to have more UE support to stand and again had too much L hip pain.  Discontinued activity and spent remainder of session performing seated (unsupported back at [facility]) exercises for LE strengthening: Seated marching x 20 reps, LAQ x 10 reps each side with 3 sec hold, hip add (pillow squeeze) x 10 reps with 5 sec hold, hip abd with red band x 10 reps with 3 sec hold and ended with hamstring pulls in w/c around full track x 115' with 3 rest breaks. Pt had no hip pain with these activities.    Therex: Seated nustep x 6 mins at level 4 with BLEs/RUE with cues to maintain speed in 60-70's rpms.  Needs intermittent cues to do so but tolerated well.             PATIENT EDUCATION: Education details: Continue working on transfers to bedside commode at home  Person educated: Patient and Clinical cytogeneticist) Education method: Consulting civil engineer, Media planner, and Verbal cues Education comprehension: verbalized understanding and needs further education   HOME EXERCISE PROGRAM: Educated to begin working on ADLs (upper body and some lower body bathing, brushing teeth/hair) seated at EOB with feet supported and S from daughter in order to work on improved trunk activation and upright tolerance/endurance.    Access Code: [email or web address] URL:  [email or web address] Date: 03/26/2022 Prepared by: Cameron Sprang  Exercises - Supine Bridge  - 1 x daily - 7 x weekly - 2 sets - 10 reps - Supine Quadricep Sets  - 1 x daily - 7 x weekly -  2 sets - 10 reps - 5 secs hold - Small Range Straight Leg Raise  - 1 x daily - 7 x weekly - 2 sets - 5 reps - Clamshell  - 1 x daily - 7 x weekly - 2 sets - 10 reps - Seated Hamstring Stretch  - 1 x daily - 7 x weekly - 1 sets - 2 reps - 30 secs hold    GOALS: Goals reviewed with patient? Yes   UPDATED LONG TERM GOALS: Target date: 06/13/2022  Pt/daughter will be IND with final HEP in order to indicate improved functional mobility and dec fall risk. Baseline: They are performing some, but not all exercises from HEP  Goal status: ONGOING   2.  Pt will perform all aspects of bed mobility at mod I level in order to indicate improved functional mobility.   Baseline: Pt is min/guard to min A for SL to sit, otherwise is S level  Goal status: ONGOING   3.  Pt will perform squat pivot transfers (to/from w/c and bedside commode/shower chair) at min A level in order to indicate improved functional independence.  Baseline: Is consistently Mod A, sometimes Max A Goal status: REVISED  4.  Pt will perform sit<>stand with RW at min A level in order to indicate improved functional mobility.   Baseline: Have not attempted yet, not appropriate Goal status: ONGOING     ASSESSMENT:  CLINICAL IMPRESSION: Pt limited by L hip pain today in WB position but tolerated seated exercises well during session.  Discussed having them schedule for Jan an 8 week plan of care with half being in pool and half on land to continue to work on improving functional mobility and strength.   OBJECTIVE IMPAIRMENTS Abnormal gait, decreased activity tolerance, decreased balance, decreased endurance, decreased mobility, difficulty walking, decreased ROM, decreased strength, hypomobility, impaired perceived functional  ability, impaired flexibility, impaired UE functional use, and postural dysfunction.   ACTIVITY LIMITATIONS carrying, lifting, bending, sitting, standing, squatting, stairs, transfers, bed mobility, bathing, toileting, dressing, and locomotion level  PARTICIPATION LIMITATIONS: meal prep, cleaning, laundry, shopping, and community activity  PERSONAL FACTORS Age, Time since onset of injury/illness/exacerbation, and 3+ comorbidities: see above  are also affecting patient's functional outcome.   REHAB POTENTIAL: Good  CLINICAL DECISION MAKING: Evolving/moderate complexity  EVALUATION COMPLEXITY: Moderate  PLAN: PT FREQUENCY: 2x/week  PT DURATION: 8 weeks  PLANNED INTERVENTIONS: Therapeutic exercises, Therapeutic activity, Neuromuscular re-education, Balance training, Gait training, Patient/Family education, Self Care, Joint mobilization, Orthotic/Fit training, DME instructions, Aquatic Therapy, and Wheelchair mobility training  PLAN FOR NEXT SESSION:  Try prone for hip stretch over wedge.  perceptual deficits (work on improving R weight shift).  Add some seated exercises to HEP for core strength, core strength (could do some sitting on rockerboard/BOSU), bed mobility, hip and knee flexibility, trunk dissociation, sit<>stand (to RW when able), gait as able in // bars.  I wonder if she could stand to a RW?? Shes familiar with this so maybe a semi automatic task??   Cameron Sprang, PT, MPT [facility] [address] [place], [place], [address] Phone: [phone]   Fax:  [phone] 06/05/22, 9:48 AM

## 2022-06-06 ENCOUNTER — Ambulatory Visit: Payer: Medicare PPO | Admitting: Rehabilitation

## 2022-06-07 ENCOUNTER — Encounter: Payer: Self-pay | Admitting: Podiatrist

## 2022-06-07 ENCOUNTER — Ambulatory Visit (INDEPENDENT_AMBULATORY_CARE_PROVIDER_SITE_OTHER): Payer: Medicare PPO | Admitting: Podiatrist

## 2022-06-07 DIAGNOSIS — M79674 Pain in right toe(s): Secondary | ICD-10-CM | POA: Diagnosis not present

## 2022-06-07 DIAGNOSIS — M79675 Pain in left toe(s): Secondary | ICD-10-CM

## 2022-06-07 DIAGNOSIS — B351 Tinea unguium: Secondary | ICD-10-CM | POA: Diagnosis not present

## 2022-06-07 NOTE — Patient Instructions (Signed)

## 2022-06-07 NOTE — Progress Notes (Signed)
Chief Complaint  Patient presents with   Nail Problem     Routine foot care     HPI: Patient is 76 y.o. female who presents today for painful thickened toenails of bilateral feet which are painful and she is unable to trim.  They are painful with ambulation and enclosed shoe gear.  She has an AFO which she wears and she is doing some rehab for strength and conditioning.  Her daughter, [name] is her caregiver and presents with Dalene Seltzer at todays visit.  Shaiann is in a wheelchair and because we are in [place], is unable to transfer to the tall exam chair.    Allergies  Allergen Reactions   Clonidine Anaphylaxis, Swelling and Other (See Comments)    Tongue swelling   Norvasc [Amlodipine Besylate] Swelling    Edema and TONGUE swelling    Lisinopril Cough   Morphine And Related    Amlodipine Anxiety, Nausea Only and Swelling   Codeine Itching and Rash   Crestor [Rosuvastatin] Rash    Red and flushed in her face   Objective: General: Patient is awake, alert, and oriented x 3 and in no acute distress.   Integument: Skin is warm, dry and supple bilateral. Nails are tender, long, thickened and  dystrophic with subungual debris, consistent with onychomycosis, 1-5 bilateral. No signs of infection. no hyperkeratotic lesions noted today-     Vasculature:  Dorsalis Pedis pulse 2/4 bilateral. Posterior Tibial pulse  1/4 bilateral.  Capillary fill time <3 sec 1-5 bilateral. Positive hair growth to the level of the digits. Temperature gradient within normal limits. trace varicosities present bilateral. No edema present bilateral.    Neurology: The patient has intact sensation measured with a 5.07/10g Semmes Weinstein Monofilament at all pedal sites bilateral . Vibratory sensation diminished bilateral with tuning fork. No Babinski sign present bilateral.    Musculoskeletal: right foot/ ankle laterally oriented/rolling to the lateral side.  Some pain with dorsiflexion noted right ankle vs. Left.   Prominent metatarsal heads with contracture lesser digits noted.     Assessment and Plan:        ICD-10-CM    1. Pain due to onychomycosis of toenails of both feet  B35.1      M79.675      M79.674             -Examined patient. -Mechanically debrided all nails 1-5 bilateral using sterile nail nipper and filed with dremel without incident  -Answered all patient questions -Patient to return  in 3 months for at risk foot care -Patient advised to call the office if any problems or questions arise in the meantime.

## 2022-06-11 ENCOUNTER — Encounter: Payer: Self-pay | Admitting: Rehabilitation

## 2022-06-11 ENCOUNTER — Encounter: Payer: Self-pay | Admitting: Nurse Practitioner

## 2022-06-11 ENCOUNTER — Ambulatory Visit: Payer: Medicare PPO | Attending: Nurse Practitioner | Admitting: Rehabilitation

## 2022-06-11 DIAGNOSIS — R293 Abnormal posture: Secondary | ICD-10-CM | POA: Insufficient documentation

## 2022-06-11 DIAGNOSIS — M6281 Muscle weakness (generalized): Secondary | ICD-10-CM | POA: Diagnosis not present

## 2022-06-11 DIAGNOSIS — I69354 Hemiplegia and hemiparesis following cerebral infarction affecting left non-dominant side: Secondary | ICD-10-CM | POA: Diagnosis not present

## 2022-06-11 DIAGNOSIS — R2681 Unsteadiness on feet: Secondary | ICD-10-CM | POA: Insufficient documentation

## 2022-06-11 DIAGNOSIS — E1169 Type 2 diabetes mellitus with other specified complication: Secondary | ICD-10-CM

## 2022-06-11 DIAGNOSIS — N184 Chronic kidney disease, stage 4 (severe): Secondary | ICD-10-CM

## 2022-06-11 NOTE — Patient Instructions (Signed)
  Aquatic Therapy: What to Expect!  Where:  [facility] [address] [place], [place]  [address] [phone]  NOTE:  You will receive an automated phone message reminding you of your appointment and it will say the appointment is at the Rehab Center on [address]  We are working to fix this- just know that you will meet us at the pool!  How to Prepare: Please make sure you drink 8 ounces of water about one hour prior to your pool session A caregiver MUST attend the entire session with the patient.  The caregiver will be responsible for assisting with dressing as well as any toileting needs.  If the patient will be doing a home program this should likely be the person who will assist as well.  Patients must wear either their street shoes or pool shoes until they are ready to enter the pool with the therapist.  Patients must also wear either street shoes or pool shoes once exiting the pool to walk to the locker room.  This will helps us prevent slips and falls.  Please arrive 15 minutes early to prepare for your pool therapy session Sign in at the front desk on the clipboard marked for [place] You may use the locker rooms on your right and then enter directly into the recreation pool (NOT the competition pool) Please make sure to attend to any toileting needs prior to entering the pool Please be dressed in your swim suit and on the pool deck at least 5 minutes before your appointment Once on the pool deck your therapist will ask you to sign the Patient  Consent and Assignment of Benefits form Your therapist may take your blood pressure prior to, during and after your session if indicated  About the pool  and parking: Entering the pool Your therapist will assist you; there are 2 ways to enter:  stairs with railings or with a chair lift.   Your therapist will determine the most appropriate way for you. Water temperature is usually between 86-87 degrees There  may be other swimmers in the pool at the same time Parking is free.   Contact Info:     Appointments: [facility]  All sessions are 45 minutes   [address]  [address]     Please call the [facility] if   [place], [place]   [address]    you need to cancel or reschedule an appointment.  [phone]       

## 2022-06-11 NOTE — Telephone Encounter (Signed)
No Lab Orders in Chart.  Please place orders of what you need.  No upcoming appointment just Medicare Wellness on 09/05/22.

## 2022-06-11 NOTE — Therapy (Signed)
OUTPATIENT PHYSICAL THERAPY NEURO TREATMENT   Patient Name: [name] MRN: [id number] DOB:[birth year], 76 y.o., female Today's Date: 06/11/2022   PCP: Sherrie Mustache, NP REFERRING PROVIDER: Sherrie Mustache, NP   PT End of Session - 06/11/22 0903     Visit Number 19    Number of Visits 20   per updated POC   Date for PT Re-Evaluation 06/13/22   Per updated POC   Authorization Type [employer] (10th visit PN needed)    Progress Note Due on Visit 20    PT Start Time 0846    PT Stop Time 0933    PT Time Calculation (min) 47 min    Activity Tolerance Patient tolerated treatment well    Behavior During Therapy [facility] for tasks assessed/performed             Past Medical History:  Diagnosis Date   Allergy    Arthritis    Broken ankle    Cataract    Diabetes mellitus without complication ([place])    History of colonoscopy    Hypertension    Macular degeneration syndrome    with edema---being treated.    Stage 4 chronic kidney disease ([place])    Stroke ([place]) 2019   Past Surgical History:  Procedure Laterality Date   callous removal  Left 2017   located on 1st digit on L foot 2/2 diabetes   EYE SURGERY     Patient Active Problem List   Diagnosis Date Noted   Ankle fracture 10/22/2020   Bimalleolar ankle fracture, right, closed, initial encounter 10/19/2020   Cataracts, bilateral 05/03/2020   Stage 4 chronic kidney disease ([place]) 12/21/2019   Iron deficiency anemia 02/03/2019   Pelvic fracture ([place]) 09/21/2018   Allergic reaction caused by a drug 05/06/2018   Bilateral leg edema 23/30/0762   Embolic stroke ([place]) 26/33/3545   Left hemiparesis ([place])    Diabetes mellitus type 2 in nonobese ([place])    Acute ischemic stroke ([place])    Left arm weakness    Overweight (BMI 25.0-29.9) 02/24/2018   Diabetes mellitus without complication ([place]) 62/56/3893   HTN (hypertension) 09/02/2017    ONSET DATE: CVA 2020, ankle fracture 2022  REFERRING DIAG: I69.354 (ICD-10-CM) -  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   Pt accompanied by:  daughter [name]  PERTINENT HISTORY:   PAIN:  Are you having pain? No  PRECAUTIONS: Fall  WEIGHT BEARING RESTRICTIONS No WBAT in R LE   FALLS: Has patient fallen in last 6 months? No  LIVING ENVIRONMENT: Lives with: lives with their family Lives in: House/apartment Stairs: No Has following equipment at home: Environmental consultant - 2 wheeled, Wheelchair (manual), and Ramped entry  PLOF: Independent with basic ADLs and Independent with household mobility without device (before CVA and ankle fracture)  PATIENT GOALS "To get out of this w/c and to stop wearing diapers."     Today's Treatment:   TRANSFERS: Assistive device utilized: Wheelchair (manual)  Sit to stand: Min/mod A Stand to sit: Min/mod A Chair to chair: mod/max A (squat pivot) Comments: Pt in her transport w/c today with soft cushion.  Performed squat pivot transfer to/from nustep at mod A to the L, max A level to the R today with continued cuing for forward weight shift/trunk lean.  She still needs slightly more assist going to the L, and requires more max A for tasks today.  Performed x 2 reps during session with  cues/facilitation for adequate forward/lateral weight shift, cues for keeping head up and safe foot placement as well as hand placement to assist.  Sit to stand performed at L platform RW today with daughter assisting to stabilize RW.       Performed sit<>stand x 3 reps to L platform RW today from elevated mat x 1 and then from arm chair x 2 reps with min A initially needing more mod A with fatigue.  PT assisted with placing LUE on platform but she tolerated it well today.  She was able to take 8 or so steps forward today with platform RW!! Attempted again, however due to fatigue unable to take any further steps forward.  We ended session with her standing to RW and transferring to her L.  She again needed more assist to stand (mod/max A) and heavy cues and facilitation for L step but she did eventually take a large step left with LLE to get to chair.     Therex: Seated nustep x 6 mins at level 4 with BLEs/RUE with cues to maintain speed in 60-70's rpms.  Needs intermittent cues to do so but tolerated well.             PATIENT EDUCATION: Education details: Continue working on transfers to bedside commode at home  Person educated: Patient and Clinical cytogeneticist) Education method: Consulting civil engineer, Media planner, and Verbal cues Education comprehension: verbalized understanding and needs further education   HOME EXERCISE PROGRAM: Educated to begin working on ADLs (upper body and some lower body bathing, brushing teeth/hair) seated at EOB with feet supported and S from daughter in order to work on improved trunk activation and upright tolerance/endurance.    Access Code: [email or web address] URL: [email or web address] Date: 03/26/2022 Prepared by: Cameron Sprang  Exercises - Supine Bridge  - 1 x daily - 7 x weekly - 2 sets - 10 reps - Supine Quadricep Sets  - 1 x daily - 7 x weekly - 2 sets - 10 reps - 5 secs hold - Small Range Straight Leg Raise  - 1 x daily - 7 x weekly - 2 sets - 5 reps -  Clamshell  - 1 x daily - 7 x weekly - 2 sets - 10 reps - Seated Hamstring Stretch  -  1 x daily - 7 x weekly - 1 sets - 2 reps - 30 secs hold    GOALS: Goals reviewed with patient? Yes   UPDATED LONG TERM GOALS: Target date: 06/13/2022  Pt/daughter will be IND with final HEP in order to indicate improved functional mobility and dec fall risk. Baseline: They are performing some, but not all exercises from HEP  Goal status: ONGOING   2.  Pt will perform all aspects of bed mobility at mod I level in order to indicate improved functional mobility.   Baseline: Pt is min/guard to min A for SL to sit, otherwise is S level  Goal status: ONGOING   3.  Pt will perform squat pivot transfers (to/from w/c and bedside commode/shower chair) at min A level in order to indicate improved functional independence.  Baseline: Is consistently Mod A, sometimes Max A Goal status: REVISED  4.  Pt will perform sit<>stand with RW at min A level in order to indicate improved functional mobility.   Baseline: Have not attempted yet, not appropriate Goal status: ONGOING     ASSESSMENT:  CLINICAL IMPRESSION: Pt able to take several steps to L PFRW today for the first time! This was very fatiguing as she was unable to take any other steps forward but did complete stand pivot transfer to L today from chair to transport chair.     OBJECTIVE IMPAIRMENTS Abnormal gait, decreased activity tolerance, decreased balance, decreased endurance, decreased mobility, difficulty walking, decreased ROM, decreased strength, hypomobility, impaired perceived functional ability, impaired flexibility, impaired UE functional use, and postural dysfunction.   ACTIVITY LIMITATIONS carrying, lifting, bending, sitting, standing, squatting, stairs, transfers, bed mobility, bathing, toileting, dressing, and locomotion level  PARTICIPATION LIMITATIONS: meal prep, cleaning, laundry, shopping, and community activity  PERSONAL FACTORS Age,  Time since onset of injury/illness/exacerbation, and 3+ comorbidities: see above  are also affecting patient's functional outcome.   REHAB POTENTIAL: Good  CLINICAL DECISION MAKING: Evolving/moderate complexity  EVALUATION COMPLEXITY: Moderate  PLAN: PT FREQUENCY: 2x/week  PT DURATION: 8 weeks  PLANNED INTERVENTIONS: Therapeutic exercises, Therapeutic activity, Neuromuscular re-education, Balance training, Gait training, Patient/Family education, Self Care, Joint mobilization, Orthotic/Fit training, DME instructions, Aquatic Therapy, and Wheelchair mobility training  PLAN FOR NEXT SESSION:  Try prone for hip stretch over wedge.  perceptual deficits (work on improving R weight shift).  Add some seated exercises to HEP for core strength, core strength (could do some sitting on rockerboard/BOSU), bed mobility, hip and knee flexibility, trunk dissociation, sit<>stand (to RW when able), gait with L PFRW  Cameron Sprang, PT, MPT [facility] [address] [place], [place], [address] Phone: [phone]   Fax:  [phone] 06/11/22, 12:08 PM

## 2022-06-13 ENCOUNTER — Ambulatory Visit: Payer: Medicare PPO | Admitting: Rehabilitation

## 2022-06-13 ENCOUNTER — Encounter: Payer: Self-pay | Admitting: Rehabilitation

## 2022-06-13 DIAGNOSIS — I69354 Hemiplegia and hemiparesis following cerebral infarction affecting left non-dominant side: Secondary | ICD-10-CM

## 2022-06-13 DIAGNOSIS — R2681 Unsteadiness on feet: Secondary | ICD-10-CM

## 2022-06-13 DIAGNOSIS — M6281 Muscle weakness (generalized): Secondary | ICD-10-CM

## 2022-06-13 DIAGNOSIS — R293 Abnormal posture: Secondary | ICD-10-CM

## 2022-06-13 NOTE — Therapy (Addendum)
OUTPATIENT PHYSICAL THERAPY NEURO TREATMENT/PROGRESS NOTE   Patient Name: [name] MRN: [id number] DOB:[birth year], 76 y.o., female Today's Date: 06/13/2022   PCP: Sherrie Mustache, NP REFERRING PROVIDER: Sherrie Mustache, NP   PT End of Session - 06/13/22 0848     Visit Number 20    Number of Visits 36   per updated POC   Date for PT Re-Evaluation 08/12/22   Per updated POC   Authorization Type [employer] (10th visit PN needed)    Progress Note Due on Visit 20    PT Start Time 0845    PT Stop Time 0930    PT Time Calculation (min) 45 min    Activity Tolerance Patient tolerated treatment well    Behavior During Therapy WFL for tasks assessed/performed             Past Medical History:  Diagnosis Date   Allergy    Arthritis    Broken ankle    Cataract    Diabetes mellitus without complication ([place])    History of colonoscopy    Hypertension    Macular degeneration syndrome    with edema---being treated.    Stage 4 chronic kidney disease ([place])    Stroke ([place]) 2019   Past Surgical History:  Procedure Laterality Date   callous removal  Left 2017   located on 1st digit on L foot 2/2 diabetes   EYE SURGERY     Patient Active Problem List   Diagnosis Date Noted   Ankle fracture 10/22/2020   Bimalleolar ankle fracture, right, closed, initial encounter 10/19/2020   Cataracts, bilateral 05/03/2020   Stage 4 chronic kidney disease ([place]) 12/21/2019   Iron deficiency anemia 02/03/2019   Pelvic fracture ([place]) 09/21/2018   Allergic reaction caused by a drug 05/06/2018   Bilateral leg edema 38/45/3646   Embolic stroke ([place]) 80/32/1224   Left hemiparesis ([place])    Diabetes mellitus type 2 in nonobese ([place])    Acute ischemic stroke ([place])    Left arm weakness    Overweight (BMI 25.0-29.9) 02/24/2018   Diabetes mellitus without complication ([place]) 82/50/0370   HTN (hypertension) 09/02/2017    ONSET DATE: CVA 2020, ankle fracture 2022  REFERRING DIAG: I69.354  (ICD-10-CM) - 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   Pt accompanied by:  daughter [name]  PERTINENT HISTORY:   PAIN:  Are you having pain? No  PRECAUTIONS: Fall  WEIGHT BEARING RESTRICTIONS No WBAT in R LE   FALLS: Has patient fallen in last 6 months? No  LIVING ENVIRONMENT: Lives with: lives with their family Lives in: House/apartment Stairs: No Has following equipment at home: Environmental consultant - 2 wheeled, Wheelchair (manual), and Ramped entry  PLOF: Independent with basic ADLs and Independent with household mobility without device (before CVA and ankle fracture)  PATIENT GOALS "To get out of this w/c and to stop wearing diapers."     Today's Treatment:   TRANSFERS: Assistive device utilized: Wheelchair (manual)  Sit to stand: Min/mod A Stand to sit: Min/mod A Chair to chair: mod/max A (squat pivot) Comments: Pt in her transport w/c today with soft cushion.  Performed squat pivot transfer to/from mat at mod A bilaterally.  She did better with forward weight shift today.   Performed x 2 reps during session with cues/facilitation for adequate forward/lateral weight shift, cues for keeping head up and safe foot placement as  well as hand placement to assist.     Performed sit<>stand x 2 reps to L platform RW today from elevated mat x 1 and then from arm chair x 2 reps with mod/max A (more assist needed with fatigue/fear).  PT assisted with placing LUE on platform but she tolerated it well today.  She was able to ambulate 10' x 2 reps forward today with platform RW!! Increased rest needed due to fatigue.  Pt still needs most assist with transferring weight forward during sit<>stand.  Does very well when she initiates BLE extension for more upright posture.    Theract:  Performed transfers as above, also assessed bed mobility for LTG.  She was able to do completely by herself today!!   Discussed going ahead and starting her aquatic therapy next week and only taking last week and a half off for holidays and PT out due to recent progress with standing and gait.  Both agree and will begin next week.            PATIENT EDUCATION: Education details: Continue working on transfers to bedside commode at home  Person educated: Patient and Clinical cytogeneticist) Education method: Consulting civil engineer, Media planner, and Verbal cues Education comprehension: verbalized understanding and needs further education   HOME EXERCISE PROGRAM: Educated to begin working on ADLs (upper body and some lower body bathing, brushing teeth/hair) seated at EOB with feet supported and S from daughter in order to work on improved trunk activation and upright tolerance/endurance.    Access Code: [email or web address] URL: [email or web address] Date: 03/26/2022 Prepared by: Cameron Sprang  Exercises - Supine Bridge  - 1 x daily - 7 x weekly - 2 sets - 10 reps - Supine Quadricep Sets  - 1 x daily - 7 x weekly - 2 sets - 10 reps - 5 secs hold - Small Range Straight Leg Raise  - 1 x daily - 7 x weekly - 2 sets - 5 reps - Clamshell  - 1 x daily - 7 x weekly - 2 sets - 10 reps - Seated Hamstring Stretch  - 1 x daily - 7 x weekly - 1 sets - 2 reps - 30 secs  hold    GOALS: Goals reviewed with patient? Yes   UPDATED LONG TERM GOALS: Target date: 06/13/2022  Pt/daughter will be IND with final  HEP in order to indicate improved functional mobility and dec fall risk. Baseline: They are performing some, but not all exercises from HEP  Goal status: MET per patient report   2.  Pt will perform all aspects of bed mobility at mod I level in order to indicate improved functional mobility.   Baseline: mod I level today Goal status: met   3.  Pt will perform squat pivot transfers (to/from w/c and bedside commode/shower chair) at min A level in order to indicate improved functional independence.  Baseline: Is consistently Mod A Goal status: partially met  4.  Pt will perform sit<>stand with RW at min A level in order to indicate improved functional mobility.   Baseline: Have not attempted yet, not appropriate Goal status: partially met    UPDATED SHORT TERM GOALS: Target date: 07/12/2022  Pt/daughter will be IND with ongoing HEP in order to indicate improved functional mobility and dec fall risk. Baseline: They are performing some, but not all exercises from HEP  Goal status: ONGOING  2.  Pt will perform stand pivot transfer with L PFRW at mod A level in each direction in order to improve independence at home.   Baseline: mod/max A   Goal Status: INITIAL  3.  Pt will perform squat pivot transfers (to/from w/c and bedside commode/shower chair) at min A level in order to indicate improved functional independence.  Baseline: Is consistently Mod A Goal status: ONGOING  4.  Pt will perform sit<>stand with L PFRW at mod A level consistently in order to indicate improved functional mobility.   Baseline: mod A/max Goal status: REVISED  5.  Pt will ambulate x 15' with L PFRW at min A level (+2A for chair follow) in order to indicate improved functional independence.   Baseline: mod A +2  Goal Status: INITIAL     UPDATED LONG TERM GOALS: Target  date: 08/12/2022  Pt/daughter will be IND with ongoing HEP in order to indicate improved functional mobility and dec fall risk. Baseline: They are performing some, but not all exercises from HEP  Goal status: ONGOING  2.  Pt will perform stand pivot transfer with L PFRW at min A level in each direction in order to improve independence at home.   Baseline: mod/max A   Goal Status: INITIAL  3.  Pt will perform squat pivot transfers (to/from w/c and bedside commode/shower chair) at [place] level in order to indicate improved functional independence.  Baseline: Is consistently Mod A Goal status: ONGOING  4.  Pt will perform sit<>stand with L PFRW at min A level in order to indicate improved functional mobility.   Baseline: mod A Goal status: REVISED  5.  Pt will ambulate x 25' with L PFRW at min A level (+2A for chair follow) in order to indicate improved functional independence.   Baseline: mod A +2  Goal Status: INITIAL    Progress Note Reporting Period 05/07/22 to 06/13/22  See note below for Objective Data and Assessment of Progress/Goals.      ASSESSMENT:  CLINICAL IMPRESSION: Continued to address LTGs and gait with L PFRW.  She has met goal for bed mobility, partially met standing goal to RW (still needs between min/mod A), but has not met goal for transfers, still needing mod A consistently, but did show marked improvement in today's session.  Will plan to recert for another 8 weeks to cover holiday break with plans to begin aquatic therapy next week.  Pt and daughter verbalize understanding.  OBJECTIVE IMPAIRMENTS Abnormal gait, decreased activity tolerance, decreased balance, decreased endurance, decreased mobility, difficulty walking, decreased ROM, decreased strength, hypomobility, impaired perceived functional ability, impaired flexibility, impaired UE functional use, and postural dysfunction.   ACTIVITY LIMITATIONS carrying, lifting, bending, sitting, standing, squatting,  stairs, transfers, bed mobility, bathing, toileting, dressing, and locomotion level  PARTICIPATION LIMITATIONS: meal prep, cleaning, laundry, shopping, and community activity  PERSONAL FACTORS Age, Time since onset of injury/illness/exacerbation, and 3+ comorbidities: see above  are also affecting patient's functional outcome.   REHAB POTENTIAL: Good  CLINICAL DECISION MAKING: Evolving/moderate complexity  EVALUATION COMPLEXITY: Moderate  PLAN: PT FREQUENCY: 2x/week  PT DURATION: 8 weeks  PLANNED INTERVENTIONS: Therapeutic exercises, Therapeutic activity, Neuromuscular re-education, Balance training, Gait training, Patient/Family education, Self Care, Joint mobilization, Orthotic/Fit training, DME instructions, Aquatic Therapy, and Wheelchair mobility training  PLAN FOR NEXT SESSION:  Remove old goals. Try prone for hip stretch over wedge.  perceptual deficits (work on improving R weight shift).  Add some seated exercises to HEP for core strength, core strength (could do some sitting on rockerboard/BOSU), bed mobility, hip and knee flexibility, trunk dissociation, sit<>stand (to RW when able), gait with L PFRW (already set up in storage closet)  Cameron Sprang, PT, MPT [facility] [address] [place], [place], [address] Phone: [phone]   Fax:  [phone] 06/13/22, 10:49 AM

## 2022-06-17 ENCOUNTER — Encounter: Payer: Self-pay | Admitting: Physical Therapy

## 2022-06-17 ENCOUNTER — Ambulatory Visit: Payer: Medicare PPO | Admitting: Physical Therapy

## 2022-06-17 DIAGNOSIS — M6281 Muscle weakness (generalized): Secondary | ICD-10-CM

## 2022-06-17 DIAGNOSIS — I69354 Hemiplegia and hemiparesis following cerebral infarction affecting left non-dominant side: Secondary | ICD-10-CM | POA: Diagnosis not present

## 2022-06-17 DIAGNOSIS — R2681 Unsteadiness on feet: Secondary | ICD-10-CM

## 2022-06-17 NOTE — Therapy (Signed)
OUTPATIENT PHYSICAL THERAPY NEURO TREATMENT   Patient Name: [name] MRN: [id number] DOB:1945/12/11, 76 y.o., female Today's Date: 06/17/2022   PCP: Sherrie Mustache, NP REFERRING PROVIDER: Sherrie Mustache, NP   PT End of Session - 06/17/22 1901     Visit Number 21    Number of Visits 36   per updated POC   Date for PT Re-Evaluation 08/12/22   Per updated POC   Authorization Type [employer] (10th visit PN needed)    Progress Note Due on Visit 20    PT Start Time 1315    PT Stop Time 1355    PT Time Calculation (min) 40 min    Equipment Utilized During Treatment Other (comment)   water walker, aquatic weights   Activity Tolerance Patient tolerated treatment well    Behavior During Therapy WFL for tasks assessed/performed             Past Medical History:  Diagnosis Date   Allergy    Arthritis    Broken ankle    Cataract    Diabetes mellitus without complication ([place])    History of colonoscopy    Hypertension    Macular degeneration syndrome    with edema---being treated.    Stage 4 chronic kidney disease ([place])    Stroke ([place]) 2019   Past Surgical History:  Procedure Laterality Date   callous removal  Left 2017   located on 1st digit on L foot 2/2 diabetes   EYE SURGERY     Patient Active Problem List   Diagnosis Date Noted   Ankle fracture 10/22/2020   Bimalleolar ankle fracture, right, closed, initial encounter 10/19/2020   Cataracts, bilateral 05/03/2020   Stage 4 chronic kidney disease ([place]) 12/21/2019   Iron deficiency anemia 02/03/2019   Pelvic fracture ([place]) 09/21/2018   Allergic reaction caused by a drug 05/06/2018   Bilateral leg edema 09/05/4067   Embolic stroke ([place]) 86/14/8307   Left hemiparesis ([place])    Diabetes mellitus type 2 in nonobese ([place])    Acute ischemic stroke ([place])    Left arm weakness    Overweight (BMI 25.0-29.9) 02/24/2018   Diabetes mellitus without complication ([place]) 35/43/0148   HTN (hypertension) 09/02/2017     ONSET DATE: CVA 2020, ankle fracture 2022  REFERRING DIAG: I69.354 (ICD-10-CM) - Hemiplegia and hemiparesis following cerebral infarction affecting left non-dominant side (HCC) M25.371 (ICD-10-CM) - Instability of right ankle joint S82.841P (ICD-10-CM) - Closed bimalleolar fracture of right ankle with malunion, subsequent encounter R26.81 (ICD-10-CM) - Unstable gait   THERAPY DIAG:  Hemiplegia and hemiparesis following cerebral infarction affecting left non-dominant side (HCC)  Unsteadiness on feet  Muscle weakness (generalized)  Rationale for Evaluation and Treatment Rehabilitation  SUBJECTIVE:  SUBJECTIVE STATEMENT: Pt presents for 1st aquatic therapy session at [facility] - accompanied by her daughter  Pt accompanied by:  daughter [name]  PERTINENT HISTORY:   PAIN:  Are you having pain? No  PRECAUTIONS: Fall  WEIGHT BEARING RESTRICTIONS No WBAT in R LE   FALLS: Has patient fallen in last 6 months? No  LIVING ENVIRONMENT: Lives with: lives with their family Lives in: House/apartment Stairs: No Has following equipment at home: Environmental consultant - 2 wheeled, Wheelchair (manual), and Ramped entry  PLOF: Independent with basic ADLs and Independent with household mobility without device (before CVA and ankle fracture)  PATIENT GOALS "To get out of this w/c and to stop wearing diapers."     Today's Treatment:   06-17-22:  Cameron Sprang, PT, assisted with treatment  Patient seen for aquatic therapy today.  Treatment took place in water 3.6-4.0 feet deep depending upon activity.  Pt entered and exited the pool via chair lift; pt was transferred from her transport wheelchair to pool chair lift with mod to max assist using squat pivot transfer.  Pt wearing air cast on RLE.  Pt stood at side of pool  with RUE support on pool edge with min assist and performed lateral weight shifts approx.  10 reps; cues to stand erect, tuck hips, shift anteriorly and to hold head up rather than looking down  Pt gait trained in 4' water depth with water walker with +2 mod to min assist - feet had tendency to float up so 3# aquatic weights were placed on each leg to decrease buoyancy and to increase weight bearing in bil. LE's; pt gait trained 18' x approx. 6 reps; sidestepping 18' x 4 reps with water walker with +2 mod to min assist for erect posture with tactile cues on hips to facilitate anterior weight shift and also blocking of Lt foot to minimize forward movement to keep feet under COG  Pt performed stepping backward for hip extension strengthening with UE support on water walker 10 reps each leg with min assist - 3# weight on each leg  Pt performed closed chain hip & knee extension strengthening with pt in supine position, supported by PT, with feet on pool wall; pushed into flexion, pt pushed away from wall into extension 10 reps  Supine position - supported by PT - pt performed bicycling legs 10 reps for hip and knee flexor strengthening and hip abduction/adduction 10 reps  Pt performed stepping up/down from aquatic step in 3.6" water depth - 10 reps each leg with RUE support on pool edge and with mod assist   Pt requires buoyancy of water for support for reduced fall risk and for unloading/reduced stress on joints (Rt ankle) as pt able to tolerate increased standing and ambulation in water compared to that on land;  viscosity of water is needed for resistance for strengthening and current of water provides perturbations for challenge for balance training   PATIENT EDUCATION: Education details: Continue working on transfers to bedside commode at home  Person educated: Patient and Clinical cytogeneticist) Education method: Consulting civil engineer, Media planner, and Verbal cues Education comprehension: verbalized understanding  and needs further education   HOME EXERCISE PROGRAM: Educated to begin working on ADLs (upper body and some lower body bathing, brushing teeth/hair) seated at EOB with feet supported and S from daughter in order to work on improved trunk activation and upright tolerance/endurance.    Access Code: [email or web address] URL: [email or web address] Date: 03/26/2022 Prepared by: Cameron Sprang  Exercises - Supine Bridge  - 1  x daily - 7 x weekly - 2 sets - 10 reps - Supine Quadricep Sets  - 1 x daily - 7 x weekly - 2 sets - 10 reps - 5 secs hold - Small Range Straight Leg Raise  - 1 x daily - 7 x weekly - 2 sets - 5 reps - Clamshell  - 1 x daily - 7 x weekly - 2 sets - 10 reps - Seated Hamstring Stretch  - 1 x daily - 7 x weekly - 1 sets - 2 reps - 30 secs hold    GOALS: Goals reviewed with patient? Yes   UPDATED LONG TERM GOALS: Target date: 06/13/2022  Pt/daughter will be IND with final HEP in order to indicate improved functional mobility and dec fall risk. Baseline: They are performing some, but not all exercises from HEP  Goal status: MET per patient report   2.  Pt will perform all aspects of bed mobility at mod I level in order to indicate improved functional mobility.   Baseline: mod I level today Goal status: met   3.  Pt will perform squat pivot transfers (to/from w/c and bedside commode/shower chair) at min A level in order to indicate improved functional independence.  Baseline: Is consistently Mod A Goal status: partially met  4.  Pt will perform sit<>stand with RW at min A level in order to indicate improved functional mobility.   Baseline: Have not attempted yet, not appropriate Goal status: partially met    UPDATED SHORT TERM GOALS: Target date: 07/12/2022  Pt/daughter will be IND with ongoing HEP in order to indicate improved functional mobility and dec fall risk. Baseline: They are performing some, but not all exercises from HEP  Goal status: ONGOING  2.  Pt  will perform stand pivot transfer with L PFRW at mod A level in each direction in order to improve independence at home.   Baseline: mod/max A   Goal Status: INITIAL  3.  Pt will perform squat pivot transfers (to/from w/c and bedside commode/shower chair) at min A level in order to indicate improved functional independence.  Baseline: Is consistently Mod A Goal status: ONGOING  4.  Pt will perform sit<>stand with L PFRW at mod A level consistently in order to indicate improved functional mobility.   Baseline: mod A/max Goal status: REVISED  5.  Pt will ambulate x 15' with L PFRW at min A level (+2A for chair follow) in order to indicate improved functional independence.   Baseline: mod A +2  Goal Status: INITIAL     UPDATED LONG TERM GOALS: Target date: 08/12/2022  Pt/daughter will be IND with ongoing HEP in order to indicate improved functional mobility and dec fall risk. Baseline: They are performing some, but not all exercises from HEP  Goal status: ONGOING  2.  Pt will perform stand pivot transfer with L PFRW at min A level in each direction in order to improve independence at home.   Baseline: mod/max A   Goal Status: INITIAL  3.  Pt will perform squat pivot transfers (to/from w/c and bedside commode/shower chair) at [place] level in order to indicate improved functional independence.  Baseline: Is consistently Mod A Goal status: ONGOING  4.  Pt will perform sit<>stand with L PFRW at min A level in order to indicate improved functional mobility.   Baseline: mod A Goal status: REVISED  5.  Pt will ambulate x 25' with L PFRW at min A level (+2A for chair follow) in  order to indicate improved functional independence.   Baseline: mod A +2  Goal Status: INITIAL      ASSESSMENT:  CLINICAL IMPRESSION: Aquatic therapy session focused on gait training in approx. 4' water depth with water walker and aquatic weights to decrease flotation of bil. LE's to promote increased weight  bearing.  Session also focused on static standing balance with tactile and verbal cues needed to facilitate anterior weight shift and mod tactile cues to shift hips anteriorly over BOS.  Pt reported fatigue at end of session but tolerated increased exercise in aquatic environment compared to that on land.  Cont with POC.     OBJECTIVE IMPAIRMENTS Abnormal gait, decreased activity tolerance, decreased balance, decreased endurance, decreased mobility, difficulty walking, decreased ROM, decreased strength, hypomobility, impaired perceived functional ability, impaired flexibility, impaired UE functional use, and postural dysfunction.   ACTIVITY LIMITATIONS carrying, lifting, bending, sitting, standing, squatting, stairs, transfers, bed mobility, bathing, toileting, dressing, and locomotion level  PARTICIPATION LIMITATIONS: meal prep, cleaning, laundry, shopping, and community activity  PERSONAL FACTORS Age, Time since onset of injury/illness/exacerbation, and 3+ comorbidities: see above  are also affecting patient's functional outcome.   REHAB POTENTIAL: Good  CLINICAL DECISION MAKING: Evolving/moderate complexity  EVALUATION COMPLEXITY: Moderate  PLAN: PT FREQUENCY: 2x/week  PT DURATION: 8 weeks  PLANNED INTERVENTIONS: Therapeutic exercises, Therapeutic activity, Neuromuscular re-education, Balance training, Gait training, Patient/Family education, Self Care, Joint mobilization, Orthotic/Fit training, DME instructions, Aquatic Therapy, and Wheelchair mobility training  PLAN FOR NEXT SESSION:  Remove old goals. Try prone for hip stretch over wedge.  perceptual deficits (work on improving R weight shift).  Add some seated exercises to HEP for core strength, core strength (could do some sitting on rockerboard/BOSU), bed mobility, hip and knee flexibility, trunk dissociation, sit<>stand (to RW when able), gait with L PFRW (already set up in storage closet)  Guido Sander, [place] [address] [place], [place], [address] Phone: [phone]   Fax:  [phone] 06/17/22, 7:11 PM

## 2022-06-19 ENCOUNTER — Encounter: Payer: Self-pay | Admitting: Rehabilitation

## 2022-06-19 ENCOUNTER — Ambulatory Visit: Payer: Medicare PPO | Admitting: Rehabilitation

## 2022-06-19 ENCOUNTER — Telehealth: Payer: Self-pay | Admitting: Rehabilitation

## 2022-06-19 DIAGNOSIS — R293 Abnormal posture: Secondary | ICD-10-CM

## 2022-06-19 DIAGNOSIS — I69354 Hemiplegia and hemiparesis following cerebral infarction affecting left non-dominant side: Secondary | ICD-10-CM | POA: Diagnosis not present

## 2022-06-19 DIAGNOSIS — R2681 Unsteadiness on feet: Secondary | ICD-10-CM

## 2022-06-19 NOTE — Telephone Encounter (Signed)
Graylon Good!  I am working with Bethanne Ginger here at [facility] for PT.  She has made some excellent progress over the last several sessions and would greatly benefit from a left platform rolling walker.  Could you please write an order in Epic work que for left platform rolling walker and I can provide this for them at next visit.    Thanks so much!  Cameron Sprang, PT, MPT [facility] [address] [place], [place], [address] Phone: [phone]   Fax:  [phone] 06/19/22, 12:50 PM

## 2022-06-19 NOTE — Therapy (Signed)
OUTPATIENT PHYSICAL THERAPY NEURO TREATMENT   Patient Name: [name] MRN: [id number] DOB:17-Jun-1946, 76 y.o., female Today's Date: 06/19/2022   PCP: Sherrie Mustache, NP REFERRING PROVIDER: Sherrie Mustache, NP   PT End of Session - 06/19/22 0933     Visit Number 22    Number of Visits 36   per updated POC   Date for PT Re-Evaluation 08/12/22   Per updated POC   Authorization Type [employer] (10th visit PN needed)    Progress Note Due on Visit 20    PT Start Time 0931    PT Stop Time 1015    PT Time Calculation (min) 44 min    Equipment Utilized During Treatment Other (comment)   water walker, aquatic weights   Activity Tolerance Patient tolerated treatment well    Behavior During Therapy WFL for tasks assessed/performed             Past Medical History:  Diagnosis Date   Allergy    Arthritis    Broken ankle    Cataract    Diabetes mellitus without complication ([place])    History of colonoscopy    Hypertension    Macular degeneration syndrome    with edema---being treated.    Stage 4 chronic kidney disease ([place])    Stroke ([place]) 2019   Past Surgical History:  Procedure Laterality Date   callous removal  Left 2017   located on 1st digit on L foot 2/2 diabetes   EYE SURGERY     Patient Active Problem List   Diagnosis Date Noted   Ankle fracture 10/22/2020   Bimalleolar ankle fracture, right, closed, initial encounter 10/19/2020   Cataracts, bilateral 05/03/2020   Stage 4 chronic kidney disease ([place]) 12/21/2019   Iron deficiency anemia 02/03/2019   Pelvic fracture ([place]) 09/21/2018   Allergic reaction caused by a drug 05/06/2018   Bilateral leg edema 77/93/9030   Embolic stroke ([place]) 03/30/3006   Left hemiparesis ([place])    Diabetes mellitus type 2 in nonobese ([place])    Acute ischemic stroke ([place])    Left arm weakness    Overweight (BMI 25.0-29.9) 02/24/2018   Diabetes mellitus without complication ([place]) 62/26/3335   HTN (hypertension) 09/02/2017     ONSET DATE: CVA 2020, ankle fracture 2022  REFERRING DIAG: I69.354 (ICD-10-CM) - Hemiplegia and hemiparesis following cerebral infarction affecting left non-dominant side (HCC) M25.371 (ICD-10-CM) - Instability of right ankle joint S82.841P (ICD-10-CM) - Closed bimalleolar fracture of right ankle with malunion, subsequent encounter R26.81 (ICD-10-CM) - Unstable gait   THERAPY DIAG:  Hemiplegia and hemiparesis following cerebral infarction affecting left non-dominant side (HCC)  Unsteadiness on feet  Abnormal posture  Rationale for Evaluation and Treatment Rehabilitation  SUBJECTIVE:  SUBJECTIVE STATEMENT: Pt and daughter report that she did well yesterday, tolerated aquatic therapy well on Monday!  Pt accompanied by:  daughter [name]  PERTINENT HISTORY:   PAIN:  Are you having pain? No  PRECAUTIONS: Fall  WEIGHT BEARING RESTRICTIONS No WBAT in R LE   FALLS: Has patient fallen in last 6 months? No  LIVING ENVIRONMENT: Lives with: lives with their family Lives in: House/apartment Stairs: No Has following equipment at home: Environmental consultant - 2 wheeled, Wheelchair (manual), and Ramped entry  PLOF: Independent with basic ADLs and Independent with household mobility without device (before CVA and ankle fracture)  PATIENT GOALS "To get out of this w/c and to stop wearing diapers."     Today's Treatment:   TRANSFERS: Assistive device utilized: Wheelchair (manual)  Sit to stand: mod A Stand to sit: mod A Chair to chair:  Comments: See below.    Performed sit<>stand x 6-8 reps to L platform RW ( and from chair to sink) today from transport chair/arm chair at mod A level.  Marked improvement in forward weight shift today vs previous sessions!  Continued cues for forward weight shift and trunk  flexion with correct foot placement.  Note she also did better self scooting today to [place] prior to each bout of gait.  PT assisted with placing LUE on platform but she tolerated it well today.  She was able to ambulate 14-15' x 2 reps forward today with platform RW!  Then 8' x 2 reps making a quarter turn for these two bouts (one R and one L) to practice turning with RW.  She did better turning to the L today and does well as long as she remains in upright posture. Cues and assist for turning walker.  PT to send request to referring NP for order for L PFRW so that she may practice at least standing at home over Christmas break.  Both verbalized understanding.               PATIENT EDUCATION: Education details: Continue working on transfers to bedside commode at home  Person educated: Patient and Clinical cytogeneticist) Education method: Consulting civil engineer, Media planner, and Verbal cues Education comprehension: verbalized understanding and needs further education   HOME EXERCISE PROGRAM: Educated to begin working on ADLs (upper body and some lower body bathing, brushing teeth/hair) seated at EOB with feet supported and S from daughter in order to work on improved trunk activation and upright tolerance/endurance.    Access Code: [email or web address] URL: [email or web address] Date: 03/26/2022 Prepared by: Cameron Sprang  Exercises - Supine Bridge  - 1 x daily - 7 x weekly - 2 sets - 10 reps - Supine Quadricep Sets  - 1 x daily - 7 x weekly - 2 sets - 10 reps - 5 secs hold - Small Range Straight Leg Raise  - 1 x daily - 7 x weekly - 2 sets - 5 reps - Clamshell  - 1 x daily - 7 x weekly - 2 sets - 10 reps - Seated Hamstring Stretch  - 1 x daily - 7 x weekly - 1 sets - 2 reps - 30 secs hold    GOALS: Goals reviewed with patient? Yes   UPDATED SHORT TERM GOALS: Target date: 07/12/2022  Pt/daughter will be IND with ongoing HEP in order to indicate improved functional mobility and dec fall  risk. Baseline: They are performing some, but not all exercises from HEP  Goal status: ONGOING  2.  Pt will perform stand pivot transfer with  L PFRW at mod A level in each direction in order to improve independence at home.   Baseline: mod/max A   Goal Status: INITIAL  3.  Pt will perform squat pivot transfers (to/from w/c and bedside commode/shower chair) at min A level in order to indicate improved functional independence.  Baseline: Is consistently Mod A Goal status: ONGOING  4.  Pt will perform sit<>stand with L PFRW at mod A level consistently in order to indicate improved functional mobility.   Baseline: mod A/max Goal status: REVISED  5.  Pt will ambulate x 15' with L PFRW at min A level (+2A for chair follow) in order to indicate improved functional independence.   Baseline: mod A +2  Goal Status: INITIAL     UPDATED LONG TERM GOALS: Target date: 08/12/2022  Pt/daughter will be IND with ongoing HEP in order to indicate improved functional mobility and dec fall risk. Baseline: They are performing some, but not all exercises from HEP  Goal status: ONGOING  2.  Pt will perform stand pivot transfer with L PFRW at min A level in each direction in order to improve independence at home.   Baseline: mod/max A   Goal Status: INITIAL  3.  Pt will perform squat pivot transfers (to/from w/c and bedside commode/shower chair) at [place] level in order to indicate improved functional independence.  Baseline: Is consistently Mod A Goal status: ONGOING  4.  Pt will perform sit<>stand with L PFRW at min A level in order to indicate improved functional mobility.   Baseline: mod A Goal status: REVISED  5.  Pt will ambulate x 25' with L PFRW at min A level (+2A for chair follow) in order to indicate improved functional independence.   Baseline: mod A +2  Goal Status: INITIAL        ASSESSMENT:  CLINICAL IMPRESSION: She continues to make excellent progress with sit<>stand transfers  today with L PFRW and increased distance with gait, and performed turns in each direction.  Would like to go ahead and request referral for L PFRW so that they can start standing at home with this.      OBJECTIVE IMPAIRMENTS Abnormal gait, decreased activity tolerance, decreased balance, decreased endurance, decreased mobility, difficulty walking, decreased ROM, decreased strength, hypomobility, impaired perceived functional ability, impaired flexibility, impaired UE functional use, and postural dysfunction.   ACTIVITY LIMITATIONS carrying, lifting, bending, sitting, standing, squatting, stairs, transfers, bed mobility, bathing, toileting, dressing, and locomotion level  PARTICIPATION LIMITATIONS: meal prep, cleaning, laundry, shopping, and community activity  PERSONAL FACTORS Age, Time since onset of injury/illness/exacerbation, and 3+ comorbidities: see above  are also affecting patient's functional outcome.   REHAB POTENTIAL: Good  CLINICAL DECISION MAKING: Evolving/moderate complexity  EVALUATION COMPLEXITY: Moderate  PLAN: PT FREQUENCY: 2x/week  PT DURATION: 8 weeks  PLANNED INTERVENTIONS: Therapeutic exercises, Therapeutic activity, Neuromuscular re-education, Balance training, Gait training, Patient/Family education, Self Care, Joint mobilization, Orthotic/Fit training, DME instructions, Aquatic Therapy, and Wheelchair mobility training  PLAN FOR NEXT SESSION:  Did order for walker come through? Try prone for hip stretch over wedge.  perceptual deficits (work on improving R weight shift).  Add some seated exercises to HEP for core strength, core strength (could do some sitting on rockerboard/BOSU), bed mobility, hip and knee flexibility, trunk dissociation, sit<>stand (to RW when able), gait with L PFRW (already set up in storage closet)  Cameron Sprang, PT, MPT [facility] [address] [place], [place], [address] Phone: [phone]  Fax:   [phone] 06/19/22, 12:41 PM

## 2022-06-21 NOTE — Telephone Encounter (Signed)
I have placed and signed order, not sure how to send it to you via epic.

## 2022-06-21 NOTE — Telephone Encounter (Signed)
[name] Drenda Freeze, Carlos American, NP placed the order and it is retrievable under chart review in the other orders tab.  Let me know if we need to do anything further.  Thanks,  S.Chrae B/CMA

## 2022-06-25 ENCOUNTER — Ambulatory Visit: Payer: Medicare PPO | Admitting: Physical Therapy

## 2022-06-25 ENCOUNTER — Encounter: Payer: Self-pay | Admitting: Physical Therapy

## 2022-06-25 DIAGNOSIS — I69354 Hemiplegia and hemiparesis following cerebral infarction affecting left non-dominant side: Secondary | ICD-10-CM | POA: Diagnosis not present

## 2022-06-25 DIAGNOSIS — R2681 Unsteadiness on feet: Secondary | ICD-10-CM

## 2022-06-25 DIAGNOSIS — M6281 Muscle weakness (generalized): Secondary | ICD-10-CM

## 2022-06-25 NOTE — Therapy (Signed)
OUTPATIENT PHYSICAL THERAPY NEURO TREATMENT   Patient Name: [name] MRN: [id number] DOB:05-18-1946, 76 y.o., female Today's Date: 06/25/2022   PCP: [name] Mustache, NP REFERRING PROVIDER: Sherrie Mustache, NP   PT End of Session - 06/25/22 1505     Visit Number 23    Number of Visits 36   per updated POC   Date for PT Re-Evaluation 08/12/22   Per updated POC   Authorization Type [employer] (10th visit PN needed)    Progress Note Due on Visit 20    PT Start Time 0800    PT Stop Time 0845    PT Time Calculation (min) 45 min    Equipment Utilized During Treatment Other (comment)   Lt platform RW   Activity Tolerance Patient tolerated treatment well    Behavior During Therapy WFL for tasks assessed/performed              Past Medical History:  Diagnosis Date   Allergy    Arthritis    Broken ankle    Cataract    Diabetes mellitus without complication ([place])    History of colonoscopy    Hypertension    Macular degeneration syndrome    with edema---being treated.    Stage 4 chronic kidney disease ([place])    Stroke ([place]) 2019   Past Surgical History:  Procedure Laterality Date   callous removal  Left 2017   located on 1st digit on L foot 2/2 diabetes   EYE SURGERY     Patient Active Problem List   Diagnosis Date Noted   Ankle fracture 10/22/2020   Bimalleolar ankle fracture, right, closed, initial encounter 10/19/2020   Cataracts, bilateral 05/03/2020   Stage 4 chronic kidney disease ([place]) 12/21/2019   Iron deficiency anemia 02/03/2019   Pelvic fracture ([place]) 09/21/2018   Allergic reaction caused by a drug 05/06/2018   Bilateral leg edema 01/74/9449   Embolic stroke ([place]) 67/59/1638   Left hemiparesis ([place])    Diabetes mellitus type 2 in nonobese ([place])    Acute ischemic stroke ([place])    Left arm weakness    Overweight (BMI 25.0-29.9) 02/24/2018   Diabetes mellitus without complication ([place]) 46/65/9935   HTN (hypertension) 09/02/2017    ONSET  DATE: CVA 2020, ankle fracture 2022  REFERRING DIAG: I69.354 (ICD-10-CM) - Hemiplegia and hemiparesis following cerebral infarction affecting left non-dominant side (HCC) M25.371 (ICD-10-CM) - Instability of right ankle joint S82.841P (ICD-10-CM) - Closed bimalleolar fracture of right ankle with malunion, subsequent encounter R26.81 (ICD-10-CM) - Unstable gait   THERAPY DIAG:  Hemiplegia and hemiparesis following cerebral infarction affecting left non-dominant side (HCC)  Unsteadiness on feet  Muscle weakness (generalized)  Rationale for Evaluation and Treatment Rehabilitation  SUBJECTIVE:  SUBJECTIVE STATEMENT: Pt reports no problems or changes since previous session last week - daughter asks if order for RW has come in yet; order was placed in chart - but states "DME - RW with a seat"; informed daughter that order probably should be rewritten to specify Lt platform RW to ensure coverage by insurance - copy of order was given to daughter  Pt accompanied by:  daughter [name]  PERTINENT HISTORY:   PAIN:  Are you having pain? No  PRECAUTIONS: Fall  WEIGHT BEARING RESTRICTIONS No WBAT in R LE   FALLS: Has patient fallen in last 6 months? No  LIVING ENVIRONMENT: Lives with: lives with their family Lives in: House/apartment Stairs: No Has following equipment at home: Environmental consultant - 2 wheeled, Wheelchair (manual), and Ramped entry  PLOF: Independent with basic ADLs and Independent with household mobility without device (before CVA and ankle fracture)  PATIENT GOALS "To get out of this w/c and to stop wearing diapers."     Today's Treatment: 06-25-22  TRANSFERS: Assistive device utilized: Wheelchair (manual)  Sit to stand: mod A Stand to sit: mod A   At start of session, pt performed 3 reps of  sit to stand transfer from transport wheelchair to platform RW - mod assist needed for sit to stand; cues to lean forward for anterior weight shift;  pt needed cues to reach back with RUE for wheelchair, with mod assist needed to remove LUE from platform; cues to stand erect    Gait:  pt gait trained with Lt platform RW with mod assist to keep hips shifted anteriorly, verbal cues to extend knees as pt has tendency to flex knees when fatigued;  LUE positioned correctly on platform with min to mod assist; pt needed mod assist to remove LUE from platform with stand to sit transfer  1st rep - pt amb. 21.5' 2nd rep - 11.3' (pt c/o fatigue during this rep and requested w/c after amb. This distance 3rd rep - 68' with encouragement and coercion needed for final 5' due to c/o fatigue  Pt transferred w/c to mat toward Rt side after gait training - max assist needed for squat pivot transfer  Pt transferred sit to supine with min assist; rolled onto Rt side with CGA and then to prone position with min to mod assist for hip flexor stretching Pt performed Lt knee flexion 10 reps (no resistance) - gentle overpressure at end ROM for Lt quad stretching  Lt hip extension with knee flexed at 90 degrees with min to mod assist 10 reps  In right sidelying - performed passive Lt hip flexor stretching - 2 reps 15 sec hold Lt hip abduction with knee extended 10 reps with min to CGA Lt clam shell exercise 10 reps  Rolled to supine position with min assist - performed bridging 10 reps with 3 sec hold  Supine to sit transfer with mod assist - pt transferred to w/c toward Lt side with max assist using squat pivot transfer - pt reported fatigue at end of session              PATIENT EDUCATION: Education details: Continue working on transfers to bedside commode at home  Person educated: Patient and Clinical cytogeneticist) Education method: Consulting civil engineer, Demonstration, and Verbal cues Education comprehension: verbalized  understanding and needs further education   HOME EXERCISE PROGRAM: Educated to begin working on ADLs (upper body and some lower body bathing, brushing teeth/hair) seated at EOB with feet supported and S from daughter in order  to work on improved trunk activation and upright tolerance/endurance.    Access Code: [email or web address] URL: [email or web address] Date: 03/26/2022 Prepared by: Cameron Sprang  Exercises - Supine Bridge  - 1 x daily - 7 x weekly - 2 sets - 10 reps - Supine Quadricep Sets  - 1 x daily - 7 x weekly - 2 sets - 10 reps - 5 secs hold - Small Range Straight Leg Raise  - 1 x daily - 7 x weekly - 2 sets - 5 reps - Clamshell  - 1 x daily - 7 x weekly - 2 sets - 10 reps - Seated Hamstring Stretch  - 1 x daily - 7 x weekly - 1 sets - 2 reps - 30 secs hold    GOALS: Goals reviewed with patient? Yes   UPDATED SHORT TERM GOALS: Target date: 07/12/2022  Pt/daughter will be IND with ongoing HEP in order to indicate improved functional mobility and dec fall risk. Baseline: They are performing some, but not all exercises from HEP  Goal status: ONGOING  2.  Pt will perform stand pivot transfer with L PFRW at mod A level in each direction in order to improve independence at home.   Baseline: mod/max A   Goal Status: INITIAL  3.  Pt will perform squat pivot transfers (to/from w/c and bedside commode/shower chair) at min A level in order to indicate improved functional independence.  Baseline: Is consistently Mod A Goal status: ONGOING  4.  Pt will perform sit<>stand with L PFRW at mod A level consistently in order to indicate improved functional mobility.   Baseline: mod A/max Goal status: REVISED  5.  Pt will ambulate x 15' with L PFRW at min A level (+2A for chair follow) in order to indicate improved functional independence.   Baseline: mod A +2  Goal Status: INITIAL     UPDATED LONG TERM GOALS: Target date: 08/12/2022  Pt/daughter will be IND with ongoing HEP in  order to indicate improved functional mobility and dec fall risk. Baseline: They are performing some, but not all exercises from HEP  Goal status: ONGOING  2.  Pt will perform stand pivot transfer with L PFRW at min A level in each direction in order to improve independence at home.   Baseline: mod/max A   Goal Status: INITIAL  3.  Pt will perform squat pivot transfers (to/from w/c and bedside commode/shower chair) at [place] level in order to indicate improved functional independence.  Baseline: Is consistently Mod A Goal status: ONGOING  4.  Pt will perform sit<>stand with L PFRW at min A level in order to indicate improved functional mobility.   Baseline: mod A Goal status: REVISED  5.  Pt will ambulate x 25' with L PFRW at min A level (+2A for chair follow) in order to indicate improved functional independence.   Baseline: mod A +2  Goal Status: INITIAL        ASSESSMENT:  CLINICAL IMPRESSION: PT session focused on gait training with Lt platform RW with pt amb. Furthest distance in today's session with 3 reps consisting of 21', 11', and 10' with mod tactile cues for anterior weight shift of pelvis for increased hip extension and verbal cues needed to extend knees as pt flexes knees with fatigue.  Pt did well with rolling and tolerating prone position for Lt hip flexor stretching. Pt reported fatigue at end of session. Cont with POC.     OBJECTIVE IMPAIRMENTS Abnormal gait, decreased activity tolerance, decreased balance,  decreased endurance, decreased mobility, difficulty walking, decreased ROM, decreased strength, hypomobility, impaired perceived functional ability, impaired flexibility, impaired UE functional use, and postural dysfunction.   ACTIVITY LIMITATIONS carrying, lifting, bending, sitting, standing, squatting, stairs, transfers, bed mobility, bathing, toileting, dressing, and locomotion level  PARTICIPATION LIMITATIONS: meal prep, cleaning, laundry, shopping, and  community activity  PERSONAL FACTORS Age, Time since onset of injury/illness/exacerbation, and 3+ comorbidities: see above  are also affecting patient's functional outcome.   REHAB POTENTIAL: Good  CLINICAL DECISION MAKING: Evolving/moderate complexity  EVALUATION COMPLEXITY: Moderate  PLAN: PT FREQUENCY: 2x/week  PT DURATION: 8 weeks  PLANNED INTERVENTIONS: Therapeutic exercises, Therapeutic activity, Neuromuscular re-education, Balance training, Gait training, Patient/Family education, Self Care, Joint mobilization, Orthotic/Fit training, DME instructions, Aquatic Therapy, and Wheelchair mobility training  PLAN FOR NEXT SESSION:  Did correct order for RW come? (Copy was given to [name] to attempt to try to get correct order as she states she knows Janett Billow)  Try prone for hip stretch over wedge.  perceptual deficits (work on improving R weight shift).  Add some seated exercises to HEP for core strength, core strength (could do some sitting on rockerboard/BOSU), bed mobility, hip and knee flexibility, trunk dissociation, sit<>stand (to RW when able), gait with L PFRW (already set up in storage closet)   Guido Sander, [place] [address] [place], [place], [address] Phone: [phone]   Fax:  [phone] 06/25/22, 3:10 PM

## 2022-06-26 ENCOUNTER — Ambulatory Visit: Payer: Medicare PPO

## 2022-06-26 DIAGNOSIS — M9901 Segmental and somatic dysfunction of cervical region: Secondary | ICD-10-CM | POA: Diagnosis not present

## 2022-06-26 DIAGNOSIS — R293 Abnormal posture: Secondary | ICD-10-CM

## 2022-06-26 DIAGNOSIS — M9902 Segmental and somatic dysfunction of thoracic region: Secondary | ICD-10-CM | POA: Diagnosis not present

## 2022-06-26 DIAGNOSIS — M9903 Segmental and somatic dysfunction of lumbar region: Secondary | ICD-10-CM | POA: Diagnosis not present

## 2022-06-26 DIAGNOSIS — M6281 Muscle weakness (generalized): Secondary | ICD-10-CM

## 2022-06-26 DIAGNOSIS — M5459 Other low back pain: Secondary | ICD-10-CM | POA: Diagnosis not present

## 2022-06-26 DIAGNOSIS — R2681 Unsteadiness on feet: Secondary | ICD-10-CM

## 2022-06-26 DIAGNOSIS — I69354 Hemiplegia and hemiparesis following cerebral infarction affecting left non-dominant side: Secondary | ICD-10-CM | POA: Diagnosis not present

## 2022-06-26 NOTE — Therapy (Signed)
OUTPATIENT PHYSICAL THERAPY NEURO TREATMENT   Patient Name: [name] MRN: [id number] DOB:1946/02/25, 76 y.o., female Today's Date: 06/26/2022   PCP: Sherrie Mustache, NP REFERRING PROVIDER: Sherrie Mustache, NP   PT End of Session - 06/26/22 0930     Visit Number 24    Number of Visits 36    Date for PT Re-Evaluation 08/12/22    Authorization Type [employer] (10th visit PN needed)    Progress Note Due on Visit 30    PT Start Time 0930    PT Stop Time 1010    PT Time Calculation (min) 40 min    Equipment Utilized During Treatment Other (comment)   LPFWW   Activity Tolerance Patient tolerated treatment well    Behavior During Therapy WFL for tasks assessed/performed              Past Medical History:  Diagnosis Date   Allergy    Arthritis    Broken ankle    Cataract    Diabetes mellitus without complication ([place])    History of colonoscopy    Hypertension    Macular degeneration syndrome    with edema---being treated.    Stage 4 chronic kidney disease ([place])    Stroke ([place]) 2019   Past Surgical History:  Procedure Laterality Date   callous removal  Left 2017   located on 1st digit on L foot 2/2 diabetes   EYE SURGERY     Patient Active Problem List   Diagnosis Date Noted   Ankle fracture 10/22/2020   Bimalleolar ankle fracture, right, closed, initial encounter 10/19/2020   Cataracts, bilateral 05/03/2020   Stage 4 chronic kidney disease ([place]) 12/21/2019   Iron deficiency anemia 02/03/2019   Pelvic fracture ([place]) 09/21/2018   Allergic reaction caused by a drug 05/06/2018   Bilateral leg edema 70/07/7492   Embolic stroke ([place]) 49/67/5916   Left hemiparesis ([place])    Diabetes mellitus type 2 in nonobese ([place])    Acute ischemic stroke ([place])    Left arm weakness    Overweight (BMI 25.0-29.9) 02/24/2018   Diabetes mellitus without complication ([place]) 38/46/6599   HTN (hypertension) 09/02/2017    ONSET DATE: CVA 2020, ankle fracture  2022  REFERRING DIAG: I69.354 (ICD-10-CM) - Hemiplegia and hemiparesis following cerebral infarction affecting left non-dominant side (HCC) M25.371 (ICD-10-CM) - Instability of right ankle joint S82.841P (ICD-10-CM) - Closed bimalleolar fracture of right ankle with malunion, subsequent encounter R26.81 (ICD-10-CM) - Unstable gait   THERAPY DIAG:  Hemiplegia and hemiparesis following cerebral infarction affecting left non-dominant side (HCC)  Unsteadiness on feet  Muscle weakness (generalized)  Abnormal posture  Rationale for Evaluation and Treatment Rehabilitation  SUBJECTIVE:  SUBJECTIVE STATEMENT: Patient reports doing well- dtr present. PT printed LPFWW rx and will fax. Denies falls/near falls.   Pt accompanied by:  daughter [name]  PERTINENT HISTORY:   PAIN:  Are you having pain? No  PRECAUTIONS: Fall  WEIGHT BEARING RESTRICTIONS No WBAT in R LE   FALLS: Has patient fallen in last 6 months? No  LIVING ENVIRONMENT: Lives with: lives with their family Lives in: House/apartment Stairs: No Has following equipment at home: Environmental consultant - 2 wheeled, Wheelchair (manual), and Ramped entry  PLOF: Independent with basic ADLs and Independent with household mobility without device (before CVA and ankle fracture)  PATIENT GOALS "To get out of this w/c and to stop wearing diapers."     Today's Treatment:  Gait: -42ft, 70ft, 74ft with LPFWW, ModA/MinA + wc follow for safety  - PT providing posterior support to minimize strong posterior bias with weight shift to heels   -with fatigue-> B knee flexion progressing   NMR:  -anterior ball roll out with emphasis on full anterior weight shift in preparation for sit to stand    PATIENT EDUCATION: Education details: Continue working on transfers to  bedside commode at home  Person educated: Patient and Clinical cytogeneticist) Education method: Consulting civil engineer, Media planner, and Verbal cues Education comprehension: verbalized understanding and needs further education   HOME EXERCISE PROGRAM: Educated to begin working on ADLs (upper body and some lower body bathing, brushing teeth/hair) seated at EOB with feet supported and S from daughter in order to work on improved trunk activation and upright tolerance/endurance.    Access Code: [email or web address] URL: [email or web address] Date: 03/26/2022 Prepared by: Cameron Sprang  Exercises - Supine Bridge  - 1 x daily - 7 x weekly - 2 sets - 10 reps - Supine Quadricep Sets  - 1 x daily - 7 x weekly - 2 sets - 10 reps - 5 secs hold - Small Range Straight Leg Raise  - 1 x daily - 7 x weekly - 2 sets - 5 reps - Clamshell  - 1 x daily - 7 x weekly - 2 sets - 10 reps - Seated Hamstring Stretch  - 1 x daily - 7 x weekly - 1 sets - 2 reps - 30 secs hold    GOALS: Goals reviewed with patient? Yes   UPDATED SHORT TERM GOALS: Target date: 07/12/2022  Pt/daughter will be IND with ongoing HEP in order to indicate improved functional mobility and dec fall risk. Baseline: They are performing some, but not all exercises from HEP  Goal status: ONGOING  2.  Pt will perform stand pivot transfer with L PFRW at mod A level in each direction in order to improve independence at home.   Baseline: mod/max A   Goal Status: INITIAL  3.  Pt will perform squat pivot transfers (to/from w/c and bedside commode/shower chair) at min A level in order to indicate improved functional independence.  Baseline: Is consistently Mod A Goal status: ONGOING  4.  Pt will perform sit<>stand with L PFRW at mod A level consistently in order to indicate improved functional mobility.   Baseline: mod A/max Goal status: REVISED  5.  Pt will ambulate x 15' with L PFRW at min A level (+2A for chair follow) in order to indicate improved  functional independence.   Baseline: mod A +2  Goal Status: INITIAL     UPDATED LONG TERM GOALS: Target date: 08/12/2022  Pt/daughter will be IND with ongoing HEP in order to indicate improved functional mobility and dec  fall risk. Baseline: They are performing some, but not all exercises from HEP  Goal status: ONGOING  2.  Pt will perform stand pivot transfer with L PFRW at min A level in each direction in order to improve independence at home.   Baseline: mod/max A   Goal Status: INITIAL  3.  Pt will perform squat pivot transfers (to/from w/c and bedside commode/shower chair) at [place] level in order to indicate improved functional independence.  Baseline: Is consistently Mod A Goal status: ONGOING  4.  Pt will perform sit<>stand with L PFRW at min A level in order to indicate improved functional mobility.   Baseline: mod A Goal status: REVISED  5.  Pt will ambulate x 25' with L PFRW at min A level (+2A for chair follow) in order to indicate improved functional independence.   Baseline: mod A +2  Goal Status: INITIAL        ASSESSMENT:  CLINICAL IMPRESSION: Patient seen for skilled PT session with emphasis on progressing gait training with LPFWW. Patient tolerating very well with ability to ambulate 15ft initially with equal step length noted. Though, with fatigue- increasing B knee flexion and stronger posterior bias. R foot everted with potentially muscle tone (?) contributing to this. Patient remains with fear of falling and thus minimal anterior weight shift anterior during sit <> stand transfer. Continue POC.   OBJECTIVE IMPAIRMENTS Abnormal gait, decreased activity tolerance, decreased balance, decreased endurance, decreased mobility, difficulty walking, decreased ROM, decreased strength, hypomobility, impaired perceived functional ability, impaired flexibility, impaired UE functional use, and postural dysfunction.   ACTIVITY LIMITATIONS carrying, lifting, bending, sitting,  standing, squatting, stairs, transfers, bed mobility, bathing, toileting, dressing, and locomotion level  PARTICIPATION LIMITATIONS: meal prep, cleaning, laundry, shopping, and community activity  PERSONAL FACTORS Age, Time since onset of injury/illness/exacerbation, and 3+ comorbidities: see above  are also affecting patient's functional outcome.   REHAB POTENTIAL: Good  CLINICAL DECISION MAKING: Evolving/moderate complexity  EVALUATION COMPLEXITY: Moderate  PLAN: PT FREQUENCY: 2x/week  PT DURATION: 8 weeks  PLANNED INTERVENTIONS: Therapeutic exercises, Therapeutic activity, Neuromuscular re-education, Balance training, Gait training, Patient/Family education, Self Care, Joint mobilization, Orthotic/Fit training, DME instructions, Aquatic Therapy, and Wheelchair mobility training  PLAN FOR NEXT SESSION:  Try prone for hip stretch over wedge.  perceptual deficits (work on improving R weight shift).  Add some seated exercises to HEP for core strength, core strength (could do some sitting on rockerboard/BOSU), bed mobility, hip and knee flexibility, trunk dissociation, sit<>stand (to RW when able), gait with L PFRW (already set up in storage closet)   Debbora Dus, PT, DPT, CBIS  06/26/22, 10:16 AM

## 2022-07-11 ENCOUNTER — Ambulatory Visit: Payer: Medicare PPO | Admitting: Rehabilitation

## 2022-07-12 ENCOUNTER — Ambulatory Visit: Payer: Medicare PPO | Attending: Nurse Practitioner

## 2022-07-12 DIAGNOSIS — M6281 Muscle weakness (generalized): Secondary | ICD-10-CM | POA: Diagnosis not present

## 2022-07-12 DIAGNOSIS — I69354 Hemiplegia and hemiparesis following cerebral infarction affecting left non-dominant side: Secondary | ICD-10-CM | POA: Diagnosis not present

## 2022-07-12 DIAGNOSIS — R2681 Unsteadiness on feet: Secondary | ICD-10-CM

## 2022-07-12 DIAGNOSIS — R293 Abnormal posture: Secondary | ICD-10-CM | POA: Diagnosis not present

## 2022-07-12 NOTE — Therapy (Signed)
OUTPATIENT PHYSICAL THERAPY NEURO TREATMENT   Patient Name: [name] MRN: [id number] DOB:[birth year], 77 y.o., female Today's Date: 07/12/2022   PCP: Sherrie Mustache, NP REFERRING PROVIDER: Sherrie Mustache, NP   PT End of Session - 07/12/22 0850     Visit Number 25    Number of Visits 36    Date for PT Re-Evaluation 08/12/22    Authorization Type [employer] (10th visit PN needed)    Progress Note Due on Visit 30    PT Start Time 0847    PT Stop Time 0927    PT Time Calculation (min) 40 min    Equipment Utilized During Treatment Other (comment)   L PFWW   Activity Tolerance Patient tolerated treatment well    Behavior During Therapy [facility] for tasks assessed/performed;Anxious              Past Medical History:  Diagnosis Date   Allergy    Arthritis    Broken ankle    Cataract    Diabetes mellitus without complication ([place])    History of colonoscopy    Hypertension    Macular degeneration syndrome    with edema---being treated.    Stage 4 chronic kidney disease ([place])    Stroke ([place]) 2019   Past Surgical History:  Procedure Laterality Date   callous removal  Left 2017   located on 1st digit on L foot 2/2 diabetes   EYE SURGERY     Patient Active Problem List   Diagnosis Date Noted   Ankle fracture 10/22/2020   Bimalleolar ankle fracture, right, closed, initial encounter 10/19/2020   Cataracts, bilateral 05/03/2020   Stage 4 chronic kidney disease ([place]) 12/21/2019   Iron deficiency anemia 02/03/2019   Pelvic fracture ([place]) 09/21/2018   Allergic reaction caused by a drug 05/06/2018   Bilateral leg edema 13/02/6577   Embolic stroke ([place]) 46/96/2952   Left hemiparesis ([place])    Diabetes mellitus type 2 in nonobese ([place])    Acute ischemic stroke ([place])    Left arm weakness    Overweight (BMI 25.0-29.9) 02/24/2018   Diabetes mellitus without complication ([place]) 84/13/2440   HTN (hypertension) 09/02/2017    ONSET DATE: CVA 2020, ankle fracture  2022  REFERRING DIAG: I69.354 (ICD-10-CM) - Hemiplegia and hemiparesis following cerebral infarction affecting left non-dominant side (HCC) 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 Dtr reports that insurance won't cover the RW with platform- PT wondering if it's because it was written as a rollator and not RW. States that they have been working on sit <> stand, but no walking as they didn't have the walker. Denies falls/ near falls.   Pt accompanied by:  daughter [name]  PERTINENT HISTORY:   PAIN:  Are you having pain? No  PRECAUTIONS: Fall  WEIGHT BEARING RESTRICTIONS No WBAT in R LE    PATIENT GOALS "To get out of this w/c and to stop wearing diapers."     Today's Treatment:  Gait: - attempted gait x3 trials, however patients L UE was too contracted to safely use platform walker -grossly ModA to stand due to limited anterior weight shift  NMR:  -NuStep x8 mins B LE, R UE level 2  -prolonged stretch to L UE to attempted to minimize spasticity to allow for use of RW  PATIENT EDUCATION: Education details: Continue working on transfers to bedside commode at home  Person educated: Patient and Clinical cytogeneticist) Education method: Consulting civil engineer, Media planner, and Verbal cues Education comprehension: verbalized understanding and needs further education   HOME EXERCISE PROGRAM: Educated to begin working on ADLs (upper body and some lower body  bathing, brushing teeth/hair) seated at EOB with feet supported and S from daughter in order to work on improved trunk activation and upright tolerance/endurance.    Access Code: [email or web address] URL: [email or web address] Date: 03/26/2022 Prepared by: Cameron Sprang  Exercises - Supine Bridge  - 1 x daily - 7 x weekly - 2 sets - 10 reps - Supine Quadricep Sets  - 1 x daily - 7 x weekly - 2 sets - 10 reps - 5 secs hold - Small Range Straight Leg Raise  - 1 x daily - 7 x weekly - 2 sets - 5 reps - Clamshell  - 1 x daily - 7 x weekly - 2 sets - 10 reps - Seated Hamstring Stretch  - 1 x daily - 7 x weekly - 1 sets - 2 reps - 30 secs hold    GOALS: Goals reviewed with patient? Yes   UPDATED SHORT TERM GOALS: Target date: 07/12/2022  Pt/daughter will be IND with ongoing HEP in order to indicate improved functional mobility and dec fall risk. Baseline: They are performing some, but not all exercises from HEP  Goal status: ONGOING  2.  Pt will perform stand pivot transfer with L PFRW at mod A level in each direction in order to improve independence at home.   Baseline: mod/max A   Goal Status: INITIAL  3.  Pt will perform squat pivot transfers (to/from w/c and bedside commode/shower chair) at min A level in order to indicate improved functional independence.  Baseline: Is consistently Mod A Goal status: ONGOING  4.  Pt will perform sit<>stand with L PFRW at mod A level consistently in order to indicate improved functional mobility.   Baseline: mod A/max Goal status: REVISED  5.  Pt will ambulate x 15' with L PFRW at min A level (+2A for chair follow) in order to indicate improved functional independence.   Baseline: mod A +2  Goal Status: INITIAL     UPDATED LONG TERM GOALS: Target date: 08/12/2022  Pt/daughter will be IND with ongoing HEP in order to indicate improved functional mobility and dec fall risk. Baseline: They are performing some, but not all exercises from HEP   Goal status: ONGOING  2.  Pt will  perform stand pivot transfer with L PFRW at min A level in each direction in order to improve independence at home.   Baseline: mod/max A   Goal Status: INITIAL  3.  Pt will perform squat pivot transfers (to/from w/c and bedside commode/shower chair) at [place] level in order to indicate improved functional independence.  Baseline: Is consistently Mod A Goal status: ONGOING  4.  Pt will perform sit<>stand with L PFRW at min A level in order to indicate improved functional mobility.   Baseline: mod A Goal status: REVISED  5.  Pt will ambulate x 25' with L PFRW at min A level (+2A for chair follow) in order to indicate improved functional independence.   Baseline: mod A +2  Goal Status: INITIAL        ASSESSMENT:  CLINICAL IMPRESSION: Patient seen for skilled PT session with emphasis on gross NMR and attempting gait tx. Patient unable to safely ambulate today due to contracted L UE. She remains grossly MinA/ModA for sit <> stand primarily due to limited anterior weight shift for fear of falling, per patient report. Continue POC.   OBJECTIVE IMPAIRMENTS Abnormal gait, decreased activity tolerance, decreased balance, decreased endurance, decreased mobility, difficulty walking, decreased ROM, decreased strength, hypomobility, impaired perceived functional ability, impaired flexibility, impaired UE functional use, and postural dysfunction.   ACTIVITY LIMITATIONS carrying, lifting, bending, sitting, standing, squatting, stairs, transfers, bed mobility, bathing, toileting, dressing, and locomotion level  PARTICIPATION LIMITATIONS: meal prep, cleaning, laundry, shopping, and community activity  PERSONAL FACTORS Age, Time since onset of injury/illness/exacerbation, and 3+ comorbidities: see above  are also affecting patient's functional outcome.   REHAB POTENTIAL: Good  CLINICAL DECISION MAKING: Evolving/moderate complexity  EVALUATION COMPLEXITY:  Moderate  PLAN: PT FREQUENCY: 2x/week  PT DURATION: 8 weeks  PLANNED INTERVENTIONS: Therapeutic exercises, Therapeutic activity, Neuromuscular re-education, Balance training, Gait training, Patient/Family education, Self Care, Joint mobilization, Orthotic/Fit training, DME instructions, Aquatic Therapy, and Wheelchair mobility training  PLAN FOR NEXT SESSION:  Try prone for hip stretch over wedge.  perceptual deficits (work on improving R weight shift).  Add some seated exercises to HEP for core strength, core strength (could do some sitting on rockerboard/BOSU), bed mobility, hip and knee flexibility, trunk dissociation, sit<>stand (to RW when able), gait with L PFRW (already set up in storage closet)   Debbora Dus, PT, DPT, CBIS  07/12/22, 9:50 AM

## 2022-07-15 ENCOUNTER — Ambulatory Visit: Payer: Medicare PPO | Admitting: Physical Therapy

## 2022-07-15 ENCOUNTER — Encounter: Payer: Self-pay | Admitting: Physical Therapy

## 2022-07-15 DIAGNOSIS — I69354 Hemiplegia and hemiparesis following cerebral infarction affecting left non-dominant side: Secondary | ICD-10-CM

## 2022-07-15 DIAGNOSIS — R2681 Unsteadiness on feet: Secondary | ICD-10-CM

## 2022-07-15 DIAGNOSIS — M6281 Muscle weakness (generalized): Secondary | ICD-10-CM

## 2022-07-15 DIAGNOSIS — R293 Abnormal posture: Secondary | ICD-10-CM | POA: Diagnosis not present

## 2022-07-15 NOTE — Therapy (Signed)
OUTPATIENT PHYSICAL THERAPY NEURO TREATMENT   Patient Name: [name] MRN: [id number] DOB:08/09/1945, 77 y.o., female Today's Date: 07/15/2022   PCP: Sherrie Mustache, NP REFERRING PROVIDER: Sherrie Mustache, NP   PT End of Session - 07/15/22 2016     Visit Number 26    Number of Visits 36    Date for PT Re-Evaluation 08/12/22    Authorization Type [employer] (10th visit PN needed)    Progress Note Due on Visit 30    PT Start Time 1315    PT Stop Time 1355    PT Time Calculation (min) 40 min    Equipment Utilized During Treatment Other (comment)   water walker   Activity Tolerance Patient tolerated treatment well    Behavior During Therapy WFL for tasks assessed/performed             Past Medical History:  Diagnosis Date   Allergy    Arthritis    Broken ankle    Cataract    Diabetes mellitus without complication ([place])    History of colonoscopy    Hypertension    Macular degeneration syndrome    with edema---being treated.    Stage 4 chronic kidney disease ([place])    Stroke ([place]) 2019   Past Surgical History:  Procedure Laterality Date   callous removal  Left 2017   located on 1st digit on L foot 2/2 diabetes   EYE SURGERY     Patient Active Problem List   Diagnosis Date Noted   Ankle fracture 10/22/2020   Bimalleolar ankle fracture, right, closed, initial encounter 10/19/2020   Cataracts, bilateral 05/03/2020   Stage 4 chronic kidney disease ([place]) 12/21/2019   Iron deficiency anemia 02/03/2019   Pelvic fracture ([place]) 09/21/2018   Allergic reaction caused by a drug 05/06/2018   Bilateral leg edema 15/72/6203   Embolic stroke ([place]) 55/97/4163   Left hemiparesis ([place])    Diabetes mellitus type 2 in nonobese ([place])    Acute ischemic stroke ([place])    Left arm weakness    Overweight (BMI 25.0-29.9) 02/24/2018   Diabetes mellitus without complication ([place]) 84/53/6468   HTN (hypertension) 09/02/2017    ONSET DATE: CVA 2020, ankle fracture  2022  REFERRING DIAG: I69.354 (ICD-10-CM) - Hemiplegia and hemiparesis following cerebral infarction affecting left non-dominant side (HCC) M25.371 (ICD-10-CM) - Instability of right ankle joint S82.841P (ICD-10-CM) - Closed bimalleolar fracture of right ankle with malunion, subsequent encounter R26.81 (ICD-10-CM) - Unstable gait   THERAPY DIAG:  Unsteadiness on feet  Muscle weakness (generalized)  Hemiplegia and hemiparesis following cerebral infarction affecting left non-dominant side (HCC)  Rationale for Evaluation and Treatment Rehabilitation  SUBJECTIVE:  SUBJECTIVE STATEMENT: Pt presents for aquatic therapy session; no problems reported;  accompanied by her daughter  Pt accompanied by:  daughter [name]  PERTINENT HISTORY:   PAIN:  Are you having pain? No  PRECAUTIONS: Fall  WEIGHT BEARING RESTRICTIONS No WBAT in R LE   FALLS: Has patient fallen in last 6 months? No  LIVING ENVIRONMENT: Lives with: lives with their family Lives in: House/apartment Stairs: No Has following equipment at home: Environmental consultant - 2 wheeled, Wheelchair (manual), and Ramped entry  PLOF: Independent with basic ADLs and Independent with household mobility without device (before CVA and ankle fracture)  PATIENT GOALS "To get out of this w/c and to stop wearing diapers."     Today's Treatment:   07-15-22:   Aquatic therapy at [facility] - pool temp 92 degrees   Patient seen for aquatic therapy today.  Treatment took place in water 3.6-4.0 feet deep depending upon activity.  Pt entered and exited the pool via chair lift; pt was transferred from her transport wheelchair to pool chair lift with mod to max assist using squat pivot transfer.    Pt stood at side of pool with RUE support on pool edge with min assist and  performed lateral weight shifts approx. 5 reps; cues to stand erect, tuck hips, shift anteriorly and to hold head up rather than looking down; pt performed stepping laterally (out/in) 10 reps with RLE for LLE weight bearing and closed chain strengthening; stepped backward/forward with RLE 10 reps with min assist with RUE support on pool edge; performed 1/2 marching RLE 5 reps for LLE weight bearing and improved SLS on LLE - min to mod assist for balance with these exercises  Pt gait trained in 4' water depth with water walker with +1 mod to min assist - +1 max assist needed initially with the turn after amb. 18' across width of pool:  pt gait trained 18' x approx. 10  reps; sidestepping 18' x 4 reps with water walker with +1 mod to min assist for erect posture with tactile cues on hips to facilitate anterior weight shift and also blocking of Lt foot to minimize forward movement to keep feet under COG  Pt performed stepping backward LLE for hip extension strengthening 5 reps (no weight) and then stepping out/in LLE for hip abdct./adductor strengthening 5 reps with RUE support on pool edge with min to mod assist for balance   Pt requires buoyancy of water for support for reduced fall risk and for unloading/reduced stress on joints (Rt ankle) as pt able to tolerate increased standing and ambulation in water compared to that on land;  viscosity of water is needed for resistance for strengthening and current of water provides perturbations for challenge for balance training   PATIENT EDUCATION: Education details: Continue working on transfers to bedside commode at home  Person educated: Patient and Clinical cytogeneticist) Education method: Consulting civil engineer, Media planner, and Verbal cues Education comprehension: verbalized understanding and needs further education   HOME EXERCISE PROGRAM: Educated to begin working on ADLs (upper body and some lower body bathing, brushing teeth/hair) seated at EOB with feet supported and  S from daughter in order to work on improved trunk activation and upright tolerance/endurance.    Access Code: [email or web address] URL: [email or web address] Date: 03/26/2022 Prepared by: Cameron Sprang  Exercises - Supine Bridge  - 1 x daily - 7 x weekly - 2 sets - 10 reps - Supine Quadricep Sets  - 1 x daily - 7 x weekly - 2 sets - 10  reps - 5 secs hold - Small Range Straight Leg Raise  - 1 x daily - 7 x weekly - 2 sets - 5 reps - Clamshell  - 1 x daily - 7 x weekly - 2 sets - 10 reps - Seated Hamstring Stretch  - 1 x daily - 7 x weekly - 1 sets - 2 reps - 30 secs hold    GOALS: Goals reviewed with patient? Yes   UPDATED LONG TERM GOALS: Target date: 06/13/2022  Pt/daughter will be IND with final HEP in order to indicate improved functional mobility and dec fall risk. Baseline: They are performing some, but not all exercises from HEP  Goal status: MET per patient report   2.  Pt will perform all aspects of bed mobility at mod I level in order to indicate improved functional mobility.   Baseline: mod I level today Goal status: met   3.  Pt will perform squat pivot transfers (to/from w/c and bedside commode/shower chair) at min A level in order to indicate improved functional independence.  Baseline: Is consistently Mod A Goal status: partially met  4.  Pt will perform sit<>stand with RW at min A level in order to indicate improved functional mobility.   Baseline: Have not attempted yet, not appropriate Goal status: partially met    UPDATED SHORT TERM GOALS: Target date: 07/12/2022  Pt/daughter will be IND with ongoing HEP in order to indicate improved functional mobility and dec fall risk. Baseline: They are performing some, but not all exercises from HEP  Goal status: ONGOING  2.  Pt will perform stand pivot transfer with L PFRW at mod A level in each direction in order to improve independence at home.   Baseline: mod/max A   Goal Status: INITIAL  3.  Pt will perform  squat pivot transfers (to/from w/c and bedside commode/shower chair) at min A level in order to indicate improved functional independence.  Baseline: Is consistently Mod A Goal status: ONGOING  4.  Pt will perform sit<>stand with L PFRW at mod A level consistently in order to indicate improved functional mobility.   Baseline: mod A/max Goal status: REVISED  5.  Pt will ambulate x 15' with L PFRW at min A level (+2A for chair follow) in order to indicate improved functional independence.   Baseline: mod A +2  Goal Status: INITIAL     UPDATED LONG TERM GOALS: Target date: 08/12/2022  Pt/daughter will be IND with ongoing HEP in order to indicate improved functional mobility and dec fall risk. Baseline: They are performing some, but not all exercises from HEP  Goal status: ONGOING  2.  Pt will perform stand pivot transfer with L PFRW at min A level in each direction in order to improve independence at home.   Baseline: mod/max A   Goal Status: INITIAL  3.  Pt will perform squat pivot transfers (to/from w/c and bedside commode/shower chair) at [place] level in order to indicate improved functional independence.  Baseline: Is consistently Mod A Goal status: ONGOING  4.  Pt will perform sit<>stand with L PFRW at min A level in order to indicate improved functional mobility.   Baseline: mod A Goal status: REVISED  5.  Pt will ambulate x 25' with L PFRW at min A level (+2A for chair follow) in order to indicate improved functional independence.   Baseline: mod A +2  Goal Status: INITIAL      ASSESSMENT:  CLINICAL IMPRESSION: Aquatic therapy session focused on  gait training in approx. 4' water depth with water walker with mod to min assist; cues for posture to keep hips shifted anteriorly and to position feet under hips; also cues given to look up rather than down at feet on pool floor.  No weights used in today's session as pt able to keep feet down on floor with less flotation when optimal  upright posture with anterior weight shift was achieved.  Pt reported moderate fatigue at end of session. Cont with POC.     OBJECTIVE IMPAIRMENTS Abnormal gait, decreased activity tolerance, decreased balance, decreased endurance, decreased mobility, difficulty walking, decreased ROM, decreased strength, hypomobility, impaired perceived functional ability, impaired flexibility, impaired UE functional use, and postural dysfunction.   ACTIVITY LIMITATIONS carrying, lifting, bending, sitting, standing, squatting, stairs, transfers, bed mobility, bathing, toileting, dressing, and locomotion level  PARTICIPATION LIMITATIONS: meal prep, cleaning, laundry, shopping, and community activity  PERSONAL FACTORS Age, Time since onset of injury/illness/exacerbation, and 3+ comorbidities: see above  are also affecting patient's functional outcome.   REHAB POTENTIAL: Good  CLINICAL DECISION MAKING: Evolving/moderate complexity  EVALUATION COMPLEXITY: Moderate  PLAN: PT FREQUENCY: 2x/week  PT DURATION: 8 weeks  PLANNED INTERVENTIONS: Therapeutic exercises, Therapeutic activity, Neuromuscular re-education, Balance training, Gait training, Patient/Family education, Self Care, Joint mobilization, Orthotic/Fit training, DME instructions, Aquatic Therapy, and Wheelchair mobility training  PLAN FOR NEXT SESSION:  Try prone for hip stretch over wedge.  perceptual deficits (work on improving R weight shift).  Add some seated exercises to HEP for core strength, core strength (could do some sitting on rockerboard/BOSU), bed mobility, hip and knee flexibility, trunk dissociation, sit<>stand (to RW when able), gait with L PFRW (already set up in storage closet)  Guido Sander, [place] [address] [place], [place], [address] Phone: [phone]   Fax:  [phone] 07/15/22, 8:21 PM

## 2022-07-16 ENCOUNTER — Ambulatory Visit: Payer: Medicare PPO | Admitting: Rehabilitation

## 2022-07-18 ENCOUNTER — Ambulatory Visit: Payer: Medicare PPO | Admitting: Rehabilitation

## 2022-07-22 ENCOUNTER — Ambulatory Visit: Payer: Medicare PPO | Admitting: Physical Therapy

## 2022-07-22 ENCOUNTER — Other Ambulatory Visit: Payer: Medicare PPO

## 2022-07-22 DIAGNOSIS — I69354 Hemiplegia and hemiparesis following cerebral infarction affecting left non-dominant side: Secondary | ICD-10-CM

## 2022-07-22 DIAGNOSIS — E1169 Type 2 diabetes mellitus with other specified complication: Secondary | ICD-10-CM

## 2022-07-22 DIAGNOSIS — E1122 Type 2 diabetes mellitus with diabetic chronic kidney disease: Secondary | ICD-10-CM

## 2022-07-22 DIAGNOSIS — N184 Chronic kidney disease, stage 4 (severe): Secondary | ICD-10-CM

## 2022-07-23 ENCOUNTER — Ambulatory Visit: Payer: Medicare PPO | Admitting: Rehabilitation

## 2022-07-25 ENCOUNTER — Ambulatory Visit: Payer: Medicare PPO | Admitting: Rehabilitation

## 2022-07-29 ENCOUNTER — Other Ambulatory Visit: Payer: Self-pay | Admitting: Family

## 2022-07-29 ENCOUNTER — Encounter: Payer: Self-pay | Admitting: Rehabilitation

## 2022-07-29 ENCOUNTER — Other Ambulatory Visit: Payer: Self-pay

## 2022-07-29 ENCOUNTER — Ambulatory Visit: Payer: Medicare PPO | Admitting: Rehabilitation

## 2022-07-29 ENCOUNTER — Encounter: Payer: Self-pay | Admitting: Nurse Practitioner

## 2022-07-29 DIAGNOSIS — R2681 Unsteadiness on feet: Secondary | ICD-10-CM | POA: Diagnosis not present

## 2022-07-29 DIAGNOSIS — I69354 Hemiplegia and hemiparesis following cerebral infarction affecting left non-dominant side: Secondary | ICD-10-CM | POA: Diagnosis not present

## 2022-07-29 DIAGNOSIS — M6281 Muscle weakness (generalized): Secondary | ICD-10-CM | POA: Diagnosis not present

## 2022-07-29 DIAGNOSIS — E559 Vitamin D deficiency, unspecified: Secondary | ICD-10-CM

## 2022-07-29 DIAGNOSIS — R293 Abnormal posture: Secondary | ICD-10-CM | POA: Diagnosis not present

## 2022-07-29 NOTE — Progress Notes (Signed)
Vitamin D level lab added to current labs as requested by patient to be drawn with routine labs in one week.

## 2022-07-29 NOTE — Therapy (Signed)
OUTPATIENT PHYSICAL THERAPY NEURO TREATMENT   Patient Name: [name] MRN: [id number] DOB:September 05, 1945, 77 y.o., female Today's Date: 07/29/2022   PCP: Sherrie Mustache, NP REFERRING PROVIDER: Sherrie Mustache, NP   PT End of Session - 07/29/22 1809     Visit Number 27    Number of Visits 36    Date for PT Re-Evaluation 08/12/22    Authorization Type [employer] (10th visit PN needed)    Progress Note Due on Visit 30    PT Start Time 1235    PT Stop Time 1315    PT Time Calculation (min) 40 min    Equipment Utilized During Treatment Other (comment)   water walker   Activity Tolerance Patient tolerated treatment well    Behavior During Therapy WFL for tasks assessed/performed             Past Medical History:  Diagnosis Date   Allergy    Arthritis    Broken ankle    Cataract    Diabetes mellitus without complication ([place])    History of colonoscopy    Hypertension    Macular degeneration syndrome    with edema---being treated.    Stage 4 chronic kidney disease ([place])    Stroke ([place]) 2019   Past Surgical History:  Procedure Laterality Date   callous removal  Left 2017   located on 1st digit on L foot 2/2 diabetes   EYE SURGERY     Patient Active Problem List   Diagnosis Date Noted   Ankle fracture 10/22/2020   Bimalleolar ankle fracture, right, closed, initial encounter 10/19/2020   Cataracts, bilateral 05/03/2020   Stage 4 chronic kidney disease ([place]) 12/21/2019   Iron deficiency anemia 02/03/2019   Pelvic fracture ([place]) 09/21/2018   Allergic reaction caused by a drug 05/06/2018   Bilateral leg edema 64/33/2951   Embolic stroke ([place]) 88/41/6606   Left hemiparesis ([place])    Diabetes mellitus type 2 in nonobese ([place])    Acute ischemic stroke ([place])    Left arm weakness    Overweight (BMI 25.0-29.9) 02/24/2018   Diabetes mellitus without complication ([place]) 30/16/0109   HTN (hypertension) 09/02/2017    ONSET DATE: CVA 2020, ankle fracture  2022  REFERRING DIAG: I69.354 (ICD-10-CM) - Hemiplegia and hemiparesis following cerebral infarction affecting left non-dominant side (HCC) M25.371 (ICD-10-CM) - Instability of right ankle joint S82.841P (ICD-10-CM) - Closed bimalleolar fracture of right ankle with malunion, subsequent encounter R26.81 (ICD-10-CM) - Unstable gait   THERAPY DIAG:  Unsteadiness on feet  Muscle weakness (generalized)  Hemiplegia and hemiparesis following cerebral infarction affecting left non-dominant side (HCC)  Abnormal posture  Rationale for Evaluation and Treatment Rehabilitation  SUBJECTIVE:  SUBJECTIVE STATEMENT: Pt/daughter reports feeling better after being sick all last week.    Pt accompanied by:  daughter [name]  PERTINENT HISTORY:   PAIN:  Are you having pain? No  PRECAUTIONS: Fall  WEIGHT BEARING RESTRICTIONS No WBAT in R LE   FALLS: Has patient fallen in last 6 months? No  LIVING ENVIRONMENT: Lives with: lives with their family Lives in: House/apartment Stairs: No Has following equipment at home: Environmental consultant - 2 wheeled, Wheelchair (manual), and Ramped entry  PLOF: Independent with basic ADLs and Independent with household mobility without device (before CVA and ankle fracture)  PATIENT GOALS "To get out of this w/c and to stop wearing diapers."     Today's Treatment:   07/29/22:  Guido Sander, PT, assisted with treatment  Patient seen for aquatic therapy today.  Treatment took place in water 3.6-4.0 feet deep depending upon activity.  Pt entered and exited the pool via chair lift; pt was transferred from her transport wheelchair to pool chair lift with mod to max assist using squat pivot transfer.  Pt wearing orthopedic boot for transfer but this removed upon being lowered into water.   Pt  stood at side of pool with RUE support on pool edge with min assist and performed hip abd x 10 reps each side, marching in place x 20 reps total; cues to stand erect, tuck hips, shift anteriorly and to hold head up rather than looking down.  Also cues needed for keeping LE off ground when lifting leg out to the side (swinging leg more rather than just stepping).  LE extension x 10 reps each side, Mini squats x 10 reps with single UE support with great hip extension noted!  Ambulated with pt at mod A over to pool bench and placed step stool under feet for "lift offs" with buttocks to improve forward weight shift for sit<>stand x 10 reps.  With fatigue, pt needing more assist to maintain midline and increase forward weight shift.    Attempted tall kneeling on 2nd step with RUE support however this hurt knees, even when we attempted to place cuff weight under knees.    Pt gait trained in 4' water depth with water walker with mod to min assist - Did so without weights initially but then added to see if this would be easier.   3# aquatic weights were placed on each leg to decrease buoyancy and to increase weight bearing in bil. LE's x 2 laps; pt gait trained 18' x approx.  4 reps (without weights, 2 laps with weights); Utilized water walker with mod to min assist for erect posture with tactile cues on hips to facilitate anterior weight shift and also blocking of Lt foot to minimize forward movement to keep feet under COG.  PT then removed water walker for last 2 laps as PT felt she could provide better facilitation for upright posture without this.  Pt tolerated well.       Pt requires buoyancy of water for support for reduced fall risk and for unloading/reduced stress on joints (Rt ankle) as pt able to tolerate increased standing and ambulation in water compared to that on land;  viscosity of water is needed for resistance for strengthening and current of water provides perturbations for challenge for balance  training   PATIENT EDUCATION: Education details: Continue working on transfers to bedside commode at home  Person educated: Patient and Child(ren) Education method: Explanation, Demonstration, and Verbal cues Education comprehension: verbalized understanding and needs further education  HOME EXERCISE PROGRAM: Educated to begin working on ADLs (upper body and some lower body bathing, brushing teeth/hair) seated at EOB with feet supported and S from daughter in order to work on improved trunk activation and upright tolerance/endurance.    Access Code: [email or web address] URL: [email or web address] Date: 03/26/2022 Prepared by: Cameron Sprang  Exercises - Supine Bridge  - 1 x daily - 7 x weekly - 2 sets - 10 reps - Supine Quadricep Sets  - 1 x daily - 7 x weekly - 2 sets - 10 reps - 5 secs hold - Small Range Straight Leg Raise  - 1 x daily - 7 x weekly - 2 sets - 5 reps - Clamshell  - 1 x daily - 7 x weekly - 2 sets - 10 reps - Seated Hamstring Stretch  - 1 x daily - 7 x weekly - 1 sets - 2 reps - 30 secs hold    GOALS: Goals reviewed with patient? Yes   UPDATED LONG TERM GOALS: Target date: 06/13/2022  Pt/daughter will be IND with final HEP in order to indicate improved functional mobility and dec fall risk. Baseline: They are performing some, but not all exercises from HEP  Goal status: MET per patient report   2.  Pt will perform all aspects of bed mobility at mod I level in order to indicate improved functional mobility.   Baseline: mod I level today Goal status: met   3.  Pt will perform squat pivot transfers (to/from w/c and bedside commode/shower chair) at min A level in order to indicate improved functional independence.  Baseline: Is consistently Mod A Goal status: partially met  4.  Pt will perform sit<>stand with RW at min A level in order to indicate improved functional mobility.   Baseline: Have not attempted yet, not appropriate Goal status: partially met     UPDATED SHORT TERM GOALS: Target date: 07/12/2022  Pt/daughter will be IND with ongoing HEP in order to indicate improved functional mobility and dec fall risk. Baseline: They are performing some, but not all exercises from HEP  Goal status: ONGOING  2.  Pt will perform stand pivot transfer with L PFRW at mod A level in each direction in order to improve independence at home.   Baseline: mod/max A   Goal Status: INITIAL  3.  Pt will perform squat pivot transfers (to/from w/c and bedside commode/shower chair) at min A level in order to indicate improved functional independence.  Baseline: Is consistently Mod A Goal status: ONGOING  4.  Pt will perform sit<>stand with L PFRW at mod A level consistently in order to indicate improved functional mobility.   Baseline: mod A/max Goal status: REVISED  5.  Pt will ambulate x 15' with L PFRW at min A level (+2A for chair follow) in order to indicate improved functional independence.   Baseline: mod A +2  Goal Status: INITIAL     UPDATED LONG TERM GOALS: Target date: 08/12/2022  Pt/daughter will be IND with ongoing HEP in order to indicate improved functional mobility and dec fall risk. Baseline: They are performing some, but not all exercises from HEP  Goal status: ONGOING  2.  Pt will perform stand pivot transfer with L PFRW at min A level in each direction in order to improve independence at home.   Baseline: mod/max A   Goal Status: INITIAL  3.  Pt will perform squat pivot transfers (to/from w/c and bedside commode/shower chair) at CGA level in order to indicate  improved functional independence.  Baseline: Is consistently Mod A Goal status: ONGOING  4.  Pt will perform sit<>stand with L PFRW at min A level in order to indicate improved functional mobility.   Baseline: mod A Goal status: REVISED  5.  Pt will ambulate x 25' with L PFRW at min A level (+2A for chair follow) in order to indicate improved functional independence.    Baseline: mod A +2  Goal Status: INITIAL      ASSESSMENT:  CLINICAL IMPRESSION: Aquatic therapy session focused on gait training in approx. 4' water depth with water walker with and without aquatic weights to decrease flotation of bil. LE's to promote increased weight bearing.  However she seemed to not need weights as much today.  Session also focused on static standing balance with tactile and verbal cues needed to facilitate anterior weight shift and mod tactile cues to shift hips anteriorly over BOS.  Pt reported fatigue at end of session but tolerated increased exercise in aquatic environment compared to that on land.  Cont with POC.     OBJECTIVE IMPAIRMENTS Abnormal gait, decreased activity tolerance, decreased balance, decreased endurance, decreased mobility, difficulty walking, decreased ROM, decreased strength, hypomobility, impaired perceived functional ability, impaired flexibility, impaired UE functional use, and postural dysfunction.   ACTIVITY LIMITATIONS carrying, lifting, bending, sitting, standing, squatting, stairs, transfers, bed mobility, bathing, toileting, dressing, and locomotion level  PARTICIPATION LIMITATIONS: meal prep, cleaning, laundry, shopping, and community activity  PERSONAL FACTORS Age, Time since onset of injury/illness/exacerbation, and 3+ comorbidities: see above  are also affecting patient's functional outcome.   REHAB POTENTIAL: Good  CLINICAL DECISION MAKING: Evolving/moderate complexity  EVALUATION COMPLEXITY: Moderate  PLAN: PT FREQUENCY: 2x/week  PT DURATION: 8 weeks  PLANNED INTERVENTIONS: Therapeutic exercises, Therapeutic activity, Neuromuscular re-education, Balance training, Gait training, Patient/Family education, Self Care, Joint mobilization, Orthotic/Fit training, DME instructions, Aquatic Therapy, and Wheelchair mobility training  PLAN FOR NEXT SESSION:  Try prone for hip stretch over wedge.  perceptual deficits (work on  improving R weight shift).  Add some seated exercises to HEP for core strength, core strength (could do some sitting on rockerboard/BOSU), bed mobility, hip and knee flexibility, trunk dissociation, sit<>stand (to RW when able), gait with L PFRW (already set up in storage closet)  Cameron Sprang, PT, MPT [facility] [address] [place], [place], [address] Phone: [phone]   Fax:  [phone] 07/29/22, 6:11 PM

## 2022-07-30 ENCOUNTER — Emergency Department (HOSPITAL_COMMUNITY)
Admission: EM | Admit: 2022-07-30 | Discharge: 2022-07-30 | Disposition: A | Discharge status: Home / Self Care | Payer: Medicare PPO | Attending: Emergency Medicine | Admitting: Emergency Medicine

## 2022-07-30 ENCOUNTER — Encounter (HOSPITAL_COMMUNITY): Payer: Self-pay

## 2022-07-30 ENCOUNTER — Other Ambulatory Visit: Payer: Self-pay

## 2022-07-30 ENCOUNTER — Ambulatory Visit: Payer: Medicare PPO | Admitting: Rehabilitation

## 2022-07-30 ENCOUNTER — Emergency Department (HOSPITAL_COMMUNITY): Payer: Medicare PPO

## 2022-07-30 DIAGNOSIS — R531 Weakness: Secondary | ICD-10-CM | POA: Diagnosis not present

## 2022-07-30 DIAGNOSIS — Z1152 Encounter for screening for COVID-19: Secondary | ICD-10-CM | POA: Diagnosis not present

## 2022-07-30 DIAGNOSIS — R509 Fever, unspecified: Secondary | ICD-10-CM | POA: Insufficient documentation

## 2022-07-30 DIAGNOSIS — N390 Urinary tract infection, site not specified: Secondary | ICD-10-CM | POA: Diagnosis not present

## 2022-07-30 DIAGNOSIS — R6889 Other general symptoms and signs: Secondary | ICD-10-CM | POA: Diagnosis not present

## 2022-07-30 DIAGNOSIS — Z7982 Long term (current) use of aspirin: Secondary | ICD-10-CM | POA: Insufficient documentation

## 2022-07-30 LAB — BASIC METABOLIC PANEL
Anion gap: 14 (ref 5–15)
BUN: 31 mg/dL — ABNORMAL HIGH (ref 8–23)
CO2: 18 mmol/L — ABNORMAL LOW (ref 22–32)
Calcium: 9.3 mg/dL (ref 8.9–10.3)
Chloride: 106 mmol/L (ref 98–111)
Creatinine, Ser: 2.07 mg/dL — ABNORMAL HIGH (ref 0.44–1.00)
GFR, Estimated: 24 mL/min — ABNORMAL LOW (ref >60)
Glucose, Bld: 170 mg/dL — ABNORMAL HIGH (ref 70–99)
Potassium: 3.9 mmol/L (ref 3.5–5.1)
Sodium: 138 mmol/L (ref 135–145)

## 2022-07-30 LAB — URINALYSIS, ROUTINE W REFLEX MICROSCOPIC
Bilirubin Urine: NEGATIVE
Glucose, UA: NEGATIVE mg/dL
Ketones, ur: NEGATIVE mg/dL
Nitrite: NEGATIVE
Protein, ur: 100 mg/dL — AB
Specific Gravity, Urine: 1.01 (ref 1.005–1.030)
WBC, UA: >50 WBC/hpf — ABNORMAL HIGH (ref 0–5)
pH: 5 (ref 5.0–8.0)

## 2022-07-30 LAB — CBC
HCT: 41.4 % (ref 36.0–46.0)
Hemoglobin: 13.7 g/dL (ref 12.0–15.0)
MCH: 26.9 pg (ref 26.0–34.0)
MCHC: 33.1 g/dL (ref 30.0–36.0)
MCV: 81.2 fL (ref 80.0–100.0)
Platelets: 458 10*3/uL — ABNORMAL HIGH (ref 150–400)
RBC: 5.1 MIL/uL (ref 3.87–5.11)
RDW: 13.4 % (ref 11.5–15.5)
WBC: 9.6 10*3/uL (ref 4.0–10.5)
nRBC: 0 % (ref 0.0–0.2)

## 2022-07-30 LAB — RESP PANEL BY RT-PCR (RSV, FLU A&B, COVID)  RVPGX2
Influenza A by PCR: NEGATIVE
Influenza B by PCR: NEGATIVE
Resp Syncytial Virus by PCR: NEGATIVE
SARS Coronavirus 2 by RT PCR: NEGATIVE

## 2022-07-30 LAB — MAGNESIUM: Magnesium: 1.9 mg/dL (ref 1.7–2.4)

## 2022-07-30 LAB — CBG MONITORING, ED: Glucose-Capillary: 142 mg/dL — ABNORMAL HIGH (ref 70–99)

## 2022-07-30 LAB — TSH: TSH: 4.272 u[IU]/mL (ref 0.350–4.500)

## 2022-07-30 MED ORDER — LACTATED RINGERS IV BOLUS
500.0000 mL | Freq: Once | INTRAVENOUS | Status: AC
  Administered 2022-07-30: 500 mL via INTRAVENOUS

## 2022-07-30 MED ORDER — LOSARTAN POTASSIUM 50 MG PO TABS
25.0000 mg | ORAL_TABLET | ORAL | Status: AC
  Administered 2022-07-30: 25 mg via ORAL
  Filled 2022-07-30: qty 1

## 2022-07-30 MED ORDER — SODIUM CHLORIDE 0.9 % IV SOLN
1.0000 g | Freq: Once | INTRAVENOUS | Status: AC
  Administered 2022-07-30: 1 g via INTRAVENOUS
  Filled 2022-07-30: qty 10

## 2022-07-30 MED ORDER — CEPHALEXIN 500 MG PO CAPS: 500.0000 mg | ORAL_CAPSULE | Freq: Four times a day (QID) | ORAL | 0 refills | Status: DC

## 2022-07-30 NOTE — Discharge Instructions (Addendum)
Please take antibiotics as prescribed Return if you are having worsening symptoms especially inability to tolerate medications Recheck with your doctor within the next week

## 2022-07-30 NOTE — ED Provider Notes (Signed)
[facility] Provider Note   CSN: [id number] Arrival date & time: 07/30/22  1352     History  Chief Complaint  Patient presents with   Weakness    [name] is a 77 y.o. female.  HPI 77 year old female history of prior stroke with residual left-sided weakness presents today with daughter who is caregiver with complaints of generalized weakness for 1 week.  Patient reports generalized weakness occurring over the past week with decreased appetite and decreased p.o. intake.  She reports taking her home medications as prescribed.  She has not noted fever, chills, vomiting, or diarrhea.  She is not having abdominal pain.     Home Medications Prior to Admission medications   Medication Sig Start Date End Date Taking? Authorizing Provider  cephALEXin (KEFLEX) 500 MG capsule Take 1 capsule (500 mg total) by mouth 4 (four) times daily. 07/30/22  Yes Pattricia Boss, MD  Accu-Chek Softclix Lancets lancets Use to test blood sugar twice daily. Dx: E11.22 11/06/21   Lauree Chandler, NP  acetaminophen (RA ACETAMINOPHEN) 650 MG CR tablet Take 1 tablet (650 mg total) by mouth every 8 (eight) hours as needed for pain. 03/07/21   Medina-Vargas, Senaida Lange, NP  AMBULATORY NON FORMULARY MEDICATION Medication Name: CBD Oil daily    [provider]  aspirin 81 MG EC tablet Take 1 tablet (81 mg total) by mouth daily. Swallow whole. 09/07/21   Lauree Chandler, NP  Blood Glucose Calibration (ACCU-CHEK GUIDE CONTROL) LIQD Use in Checking blood sugar twice daily. Dx: E11.22 11/06/21   Lauree Chandler, NP  Blood Glucose Monitoring Suppl (ACCU-CHEK GUIDE ME) w/Device KIT Use to test blood sugar twice daily. Dx: E11.22 01/31/22   Lauree Chandler, NP  cholecalciferol (VITAMIN D3) 25 MCG (1000 UNIT) tablet Take 1,000 Units by mouth 2 (two) times a week.    [provider]  ezetimibe (ZETIA) 10 MG tablet Take 1 tablet (10 mg total) by mouth daily.  02/26/22   Lauree Chandler, NP  fenofibrate (TRICOR) 145 MG tablet Take 1 tablet (145 mg total) by mouth daily. 07/12/21   Lauree Chandler, NP  glucose blood (ACCU-CHEK GUIDE) test strip Use to check blood sugar twice daily. Dx: E11.22 05/21/22   Lauree Chandler, NP  ketoconazole (NIZORAL) 2 % cream Apply to feet twice a day for 3 weeks 02/26/22   Bronson Ing, DPM  loratadine (CLARITIN) 10 MG tablet Take 1 tablet (10 mg total) by mouth daily. 11/26/21   Lauree Chandler, NP  losartan (COZAAR) 25 MG tablet TAKE 1 TABLET BY MOUTH TWICE A DAY 04/26/22   Lauree Chandler, NP  Probiotic Product (PROBIOTIC DAILY PO) Take by mouth.    [provider]  sertraline (ZOLOFT) 25 MG tablet Take 1 tablet (25 mg total) by mouth daily. Diagnosis Association: Anxiety (F41.9) 06/20/21   Lauree Chandler, NP      Allergies    Clonidine, Norvasc [amlodipine besylate], Lisinopril, Morphine and related, Amlodipine, Codeine, and Crestor [rosuvastatin]    Review of Systems   Review of Systems  Physical Exam Updated Vital Signs BP (!) 189/62   Pulse (!) 59   Temp 98.3 F (36.8 C) (Oral)   Resp (!) 21   Ht 1.575 m (5\' 2" )   Wt 58.1 kg   SpO2 100%   BMI 23.41 kg/m  Physical Exam Vitals and nursing note reviewed.  Constitutional:      Appearance:  Normal appearance.  HENT:     Head: Normocephalic and atraumatic.     Right Ear: External ear normal.     Left Ear: External ear normal.     Nose: Nose normal.     Mouth/Throat:     Mouth: Mucous membranes are dry.  Eyes:     Extraocular Movements: Extraocular movements intact.     Pupils: Pupils are equal, round, and reactive to light.  Cardiovascular:     Rate and Rhythm: Normal rate and regular rhythm.     Pulses: Normal pulses.  Pulmonary:     Effort: Pulmonary effort is normal.     Breath sounds: Normal breath sounds.  Abdominal:     General: Abdomen is flat.     Palpations: Abdomen is soft.  Musculoskeletal:      Cervical back: Normal range of motion.     Comments: Left upper extremity with contractures Right ankle in brace No wounds or skin breakdown noted Back examined with no obvious signs of trauma on back  Skin:    General: Skin is warm and dry.     Capillary Refill: Capillary refill takes less than 2 seconds.  Neurological:     General: No focal deficit present.     Mental Status: She is alert.  Psychiatric:        Mood and Affect: Mood normal.     ED Results / Procedures / Treatments   Labs (all labs ordered are listed, but only abnormal results are displayed) Labs Reviewed  BASIC METABOLIC PANEL - Abnormal; Notable for the following components:      Result Value   CO2 18 (*)    Glucose, Bld 170 (*)    BUN 31 (*)    Creatinine, Ser 2.07 (*)    GFR, Estimated 24 (*)    All other components within normal limits  CBC - Abnormal; Notable for the following components:   Platelets 458 (*)    All other components within normal limits  URINALYSIS, ROUTINE W REFLEX MICROSCOPIC - Abnormal; Notable for the following components:   APPearance CLOUDY (*)    Hgb urine dipstick SMALL (*)    Protein, ur 100 (*)    Leukocytes,Ua LARGE (*)    WBC, UA >50 (*)    Bacteria, UA MANY (*)    All other components within normal limits  CBG MONITORING, ED - Abnormal; Notable for the following components:   Glucose-Capillary 142 (*)    All other components within normal limits  RESP PANEL BY RT-PCR (RSV, FLU A&B, COVID)  [id number]  URINE CULTURE  MAGNESIUM  TSH    EKG None  Radiology CT HEAD WO CONTRAST  Result Date: 07/30/2022 CLINICAL DATA:  Generalized weakness EXAM: CT HEAD WITHOUT CONTRAST TECHNIQUE: Contiguous axial images were obtained from the base of the skull through the vertex without intravenous contrast. RADIATION DOSE REDUCTION: This exam was performed according to the departmental dose-optimization program which includes automated exposure control, adjustment of the mA and/or kV  according to patient size and/or use of iterative reconstruction technique. COMPARISON:  No prior CT head, correlation is made with MRI head 02/24/2018 FINDINGS: Brain: No evidence of acute infarction, hemorrhage, mass, mass effect, or midline shift. No hydrocephalus or extra-axial fluid collection. Encephalomalacia in the right parietal lobe, right corona radiata lacunar infarct, and bilateral basal ganglia lacunar infarcts, which appear similar to the prior MRI when accounting for differences in technique. Additional remote infarct in the right cerebellum appears new compared to  2019. Periventricular white matter changes, likely the sequela of chronic small vessel ischemic disease. Vascular: No hyperdense vessel. Atherosclerotic calcifications in the intracranial carotid and vertebral arteries. Skull: Negative for fracture or focal lesion. Sinuses/Orbits: Clear paranasal sinuses. Status post bilateral lens replacements. Other: The mastoid air cells are well aerated. IMPRESSION: No acute intracranial process. Electronically Signed   By: Merilyn Baba M.D.   On: 07/30/2022 16:31    Procedures Procedures    Medications Ordered in ED Medications  cefTRIAXone (ROCEPHIN) 1 g in sodium chloride 0.9 % 100 mL IVPB (1 g Intravenous New Bag/Given 07/30/22 2003)  lactated ringers bolus 500 mL (0 mLs Intravenous Stopped 07/30/22 1840)  losartan (COZAAR) tablet 25 mg (25 mg Oral Given 07/30/22 2005)    ED Course/ Medical Decision Making/ A&P Clinical Course as of 07/30/22 2011  Tue Jul 30, 2022  5597 Basic metabolic panel reviewed and interpreted and is significant for creatinine elevated at 2.07 but is stable from first prior [DR]  1702 CBC is reviewed and interpreted significant for elevated platelets of 458,000 which is increased from first prior but otherwise is within normal limits [DR]  1703 CT head reviewed interpreted and within normal limits etiologies interpretation concurs [DR]  1955 Urinalysis has  returned and is significant for greater than 50 white blood cells with many bacteria Will culture and give 1 g Rocephin [DR]    Clinical Course User Index [DR] Pattricia Boss, MD                             Medical Decision Making Amount and/or Complexity of Data Reviewed Labs: ordered.  Risk Prescription drug management.  77 year old female with a prior history of stroke who is wheelchair-bound presents today with increasing weakness and used p.o. intake.  Here she is evaluated for generalized weakness.  She is hemodynamically stable. This patient presents to the ED for concern of weakness and dehydration, this involves an extensive number of treatment options, and is a complaint that carries with it a high risk of complications and morbidity.  The differential diagnosis includes infection, anemia, metabolic abnormalities including hypo and hypernatremia there are electrolyte abnormalities   Co morbidities that complicate the patient evaluation  Prior history of stroke with residual left-sided deficits   Additional history obtained:  Additional history obtained from daughter who is at bedside and his caretaker External records from outside source obtained and reviewed including PT notes and prior office visit   Lab Tests:  I Ordered, and personally interpreted labs.  The pertinent results include: CBC with elevated platelets of 416,384 basic metabolic panel with BUN elevated 31 and creatinine elevated at 2.07 however creatinine is stable from first prior, TSH, [place] panel is pending   Imaging Studies ordered:  I ordered imaging studies including head CT obtained and within normal limits I independently visualized and interpreted imaging which showed head CT reviewed interpreted no evidence of acute acute intracranial abnormality is noted radiologist interpretation concurs I agree with the radiologist interpretation   Cardiac Monitoring: / EKG:  The patient was maintained  on a cardiac monitor.  I personally viewed and interpreted the cardiac monitored which showed an underlying rhythm of: nsr   Consultations Obtained: none  Problem List / ED Course / Critical interventions / Medication management  Weakness, thrombocytosis,  I ordered medication including iv fluids  for dehydration  Reevaluation of the patient after these medicines showed that the patient stayed the same  I have reviewed the patients home medicines and have made adjustments as needed   Social Determinants of Health:  Patient is wheelchair bound- daughter is caregive at bedside   Test / Admission - Considered:  Awaiting viral respiratory panel Appears stable for outpatient management          Final Clinical Impression(s) / ED Diagnoses Final diagnoses:  Weakness  Urinary tract infection with hematuria, site unspecified    Rx / DC Orders ED Discharge Orders          Ordered    cephALEXin (KEFLEX) 500 MG capsule  4 times daily        07/30/22 1959              Pattricia Boss, MD 07/30/22 2011

## 2022-07-30 NOTE — ED Notes (Signed)
RN reviewed discharge instructions with pt. Pt verbalized understanding and had no further questions. VSS upon discharge. ?

## 2022-07-30 NOTE — ED Triage Notes (Addendum)
Progressive weakness x 1 week hx of stroke.  BP 168/78 HR 60 RR 16 CBG 240 Patient reports she runs high.  Family noticed she has been getting increasingly week and usually does therapy in a pool and usually more energetic and was not this week. After speaking with the patient she denies any pain or urinary symptoms.  She did state "why am I here".  Patient is alert and oriented x 3

## 2022-07-30 NOTE — ED Notes (Signed)
Urine and Respiratory swab tube to lab.

## 2022-07-30 NOTE — ED Provider Triage Note (Signed)
Emergency Medicine Provider Triage Evaluation Note  AHRIYAH VANNEST , a 77 y.o. female  was evaluated in triage.  Pt complains of progressive weakness x 1 week.  Has a previous history of a stroke.  Denies chest pain, shortness of breath, numbness, tingling, rhinorrhea, nasal congestion.  Denies sick contacts.  Review of Systems  Positive:  Negative:  Physical Exam  BP (!) 167/78 (BP Location: Right Arm)   Pulse 66   Temp 97.7 F (36.5 C) (Oral)   Resp (!) 22   Ht 5\' 2"  (1.575 m)   Wt 58.1 kg   SpO2 98%   BMI 23.41 kg/m  Gen:   Awake, no distress   Resp:  Normal effort  MSK:   Moves extremities without difficulty  Other:  Residual left upper extremity weakness.  Grip strength 5/5 on the right.  Medical Decision Making  Medically screening exam initiated at 2:30 PM.  Appropriate orders placed.  DENEENE TARVER was informed that the remainder of the evaluation will be completed by another provider, this initial triage assessment does not replace that evaluation, and the importance of remaining in the ED until their evaluation is complete.  Workup initiated   Hayley Horn A, PA-C 07/30/22 1430

## 2022-07-31 LAB — URINE CULTURE

## 2022-08-01 ENCOUNTER — Encounter: Payer: Self-pay | Admitting: Nurse Practitioner

## 2022-08-01 ENCOUNTER — Telehealth: Payer: Self-pay | Admitting: Surgery

## 2022-08-01 ENCOUNTER — Ambulatory Visit: Payer: Medicare PPO | Admitting: Rehabilitation

## 2022-08-01 NOTE — Telephone Encounter (Signed)
Final urine culture done at the Emergency room showed multiple species of bacteria recollection of urine recommended. If you have started on Keflex as prescribed in the ED I would recommend completing antibiotics then if still having any symptoms will need to recollect urine after completing antibiotics.  But if you have not started antibiotic recollect urine specimen to send for culture and sensitivity.

## 2022-08-01 NOTE — Telephone Encounter (Signed)
REceived call from patient's daughter concerning antibiotics being discontinued due to mouth swell. She is requesting a different antibiotic. Sent message to EDP on tonight to follow up.

## 2022-08-02 ENCOUNTER — Telehealth: Payer: Self-pay

## 2022-08-02 NOTE — Patient Outreach (Signed)
  Care Coordination [facility] Note Transition Care Management Follow-up Telephone Call Date of discharge and from where[name]  Dx: "weakness" Red on EMMI-ED Discharge Alert Reason: "Scheduled follow-up appt? No" Red Alert Date: 08/01/22 How have you been since you were released from the hospital? Call completed with daughter-[name]. She voices that patient is resting but "feeling worse than when she was in the ED."  Any questions or concerns? Yes-Daughter reports that patient had "allergic reaction to Keflex." States she developed "swelling to tongue and dry heaves" after taking 8 doses of the meds and being on med for two days. Daughter states symptoms slightly improved today. She did not administer any Benadryl or other med to treat reaction as she states patient can not take a lot of meds due to kidney problems. Daughter sent message to covering provider while PCP out of the office and is awaiting response. She also called inpatient CM to see if CM could get ED MD to prescribe another abx for patient. Advised daughter that patient really need to be evaluated by medical professional. She voices that she does not want to take patient to be seen right now as she is " too weak and feeling too bad." Suggested virtual visit-daughter will think abut it. She is aware to seek medical attention ASAP for any worsening and/or unresolved issues.    Items Reviewed: Did the pt receive and understand the discharge instructions provided? Yes  Medications obtained and verified? Yes  Other? No  Any new allergies since your discharge? Yes-daughter reports reaction to Keflex Dietary orders reviewed? Yes Do you have support at home? Yes   Home Care and Equipment/Supplies: Were home health services ordered? not applicable If so, what is the name of the agency? N/A  Has the agency set up a time to come to the patient's home? not applicable Were any new equipment or medical supplies ordered?  No What is the  name of the medical supply agency? N/A Were you able to get the supplies/equipment? not applicable Do you have any questions related to the use of the equipment or supplies? No  Functional Questionnaire: (I = Independent and D = Dependent) ADLs: A  Bathing/Dressing- A  Meal Prep- A  Eating- I  Maintaining continence- A  Transferring/Ambulation- A  Managing Meds- A  Follow up appointments reviewed:  PCP Hospital f/u appt confirmed? No   Specialist Hospital f/u appt confirmed?  N/A   Are transportation arrangements needed? No  If their condition worsens, is the pt aware to call PCP or go to the Emergency Dept.? Yes Was the patient provided with contact information for the PCP's office or ED? Yes Was to pt encouraged to call back with questions or concerns? Yes  SDOH assessments and interventions completed:   Yes SDOH Interventions Today    Flowsheet Row Most Recent Value  SDOH Interventions   Food Insecurity Interventions Intervention Not Indicated  Transportation Interventions Intervention Not Indicated       Care Coordination Interventions:  Interventions Today    Flowsheet Row Most Recent Value  Education Interventions   Education Provided Provided Verbal Education  Provided Verbal Education On When to see the doctor, Medication  Nutrition Interventions   Nutrition Discussed/Reviewed Nutrition Discussed            Encounter Outcome:  Pt. Visit Completed    [facility] [place] Management Telephonic Care Management Coordinator Direct Phone: [phone] Toll Free: [phone] Fax: [phone]

## 2022-08-05 ENCOUNTER — Other Ambulatory Visit: Payer: Self-pay | Admitting: Nurse Practitioner

## 2022-08-05 DIAGNOSIS — N184 Chronic kidney disease, stage 4 (severe): Secondary | ICD-10-CM

## 2022-08-05 DIAGNOSIS — I69354 Hemiplegia and hemiparesis following cerebral infarction affecting left non-dominant side: Secondary | ICD-10-CM

## 2022-08-05 DIAGNOSIS — R5381 Other malaise: Secondary | ICD-10-CM

## 2022-08-06 ENCOUNTER — Encounter: Payer: Self-pay | Admitting: Nurse Practitioner

## 2022-08-06 ENCOUNTER — Telehealth: Payer: Self-pay

## 2022-08-06 ENCOUNTER — Ambulatory Visit: Payer: Medicare PPO | Admitting: Rehabilitation

## 2022-08-06 ENCOUNTER — Other Ambulatory Visit: Payer: Medicare PPO

## 2022-08-06 ENCOUNTER — Telehealth: Payer: Self-pay | Admitting: *Deleted

## 2022-08-06 DIAGNOSIS — I69354 Hemiplegia and hemiparesis following cerebral infarction affecting left non-dominant side: Secondary | ICD-10-CM

## 2022-08-06 DIAGNOSIS — Z515 Encounter for palliative care: Secondary | ICD-10-CM

## 2022-08-06 NOTE — Telephone Encounter (Signed)
Message was left on Clinical Intake Desk from 08/05/2022 from Candi Leash with [facility] [phone] stating that patient needs an Order for [facility]) for Palliative Care placed.   Please Advise.

## 2022-08-06 NOTE — Telephone Encounter (Signed)
Order for home health place.

## 2022-08-06 NOTE — Telephone Encounter (Signed)
(  11:48 am) PC SW called patient's daughter-[name] to discuss palliative care services and schedule a visit. Her daughter tearfully expressed that patient's condition has declined significantly. She advises that patient is no longer talking, she is not eating. Any attempt to feed patient, she vomits food up. Patient is only tolerating ice pops. Patient's sleep has increased. She has a lot of saliva also. Her daughter is concerned that patient is close to end of life. SW advised her that she will update the palliative care nurse and have her call her back to assess further. Palliative care nurse-D. Georgann Housekeeper was updated for follow-up with daughter.

## 2022-08-07 ENCOUNTER — Emergency Department (HOSPITAL_COMMUNITY): Payer: Medicare PPO

## 2022-08-07 ENCOUNTER — Other Ambulatory Visit: Payer: Medicare PPO

## 2022-08-07 ENCOUNTER — Encounter (HOSPITAL_COMMUNITY): Payer: Self-pay

## 2022-08-07 ENCOUNTER — Inpatient Hospital Stay (HOSPITAL_COMMUNITY)
Admission: EM | Admit: 2022-08-07 | Discharge: 2022-08-13 | DRG: 065 | Disposition: A | Discharge status: Rehabilitation Facility | Payer: Medicare PPO | Attending: Internal Medicine | Admitting: Internal Medicine

## 2022-08-07 ENCOUNTER — Other Ambulatory Visit: Payer: Self-pay

## 2022-08-07 DIAGNOSIS — G319 Degenerative disease of nervous system, unspecified: Secondary | ICD-10-CM | POA: Diagnosis not present

## 2022-08-07 DIAGNOSIS — R112 Nausea with vomiting, unspecified: Secondary | ICD-10-CM | POA: Diagnosis not present

## 2022-08-07 DIAGNOSIS — R339 Retention of urine, unspecified: Secondary | ICD-10-CM | POA: Diagnosis present

## 2022-08-07 DIAGNOSIS — R1310 Dysphagia, unspecified: Secondary | ICD-10-CM | POA: Diagnosis not present

## 2022-08-07 DIAGNOSIS — N200 Calculus of kidney: Secondary | ICD-10-CM | POA: Diagnosis present

## 2022-08-07 DIAGNOSIS — Z515 Encounter for palliative care: Secondary | ICD-10-CM | POA: Diagnosis not present

## 2022-08-07 DIAGNOSIS — I129 Hypertensive chronic kidney disease with stage 1 through stage 4 chronic kidney disease, or unspecified chronic kidney disease: Secondary | ICD-10-CM | POA: Diagnosis present

## 2022-08-07 DIAGNOSIS — D72829 Elevated white blood cell count, unspecified: Secondary | ICD-10-CM | POA: Diagnosis present

## 2022-08-07 DIAGNOSIS — N1831 Chronic kidney disease, stage 3a: Secondary | ICD-10-CM | POA: Diagnosis present

## 2022-08-07 DIAGNOSIS — E739 Lactose intolerance, unspecified: Secondary | ICD-10-CM | POA: Diagnosis present

## 2022-08-07 DIAGNOSIS — I6300 Cerebral infarction due to thrombosis of unspecified precerebral artery: Secondary | ICD-10-CM | POA: Diagnosis not present

## 2022-08-07 DIAGNOSIS — Z888 Allergy status to other drugs, medicaments and biological substances status: Secondary | ICD-10-CM | POA: Diagnosis not present

## 2022-08-07 DIAGNOSIS — K5900 Constipation, unspecified: Secondary | ICD-10-CM | POA: Diagnosis present

## 2022-08-07 DIAGNOSIS — Z881 Allergy status to other antibiotic agents status: Secondary | ICD-10-CM

## 2022-08-07 DIAGNOSIS — D6859 Other primary thrombophilia: Secondary | ICD-10-CM | POA: Diagnosis present

## 2022-08-07 DIAGNOSIS — Z8249 Family history of ischemic heart disease and other diseases of the circulatory system: Secondary | ICD-10-CM

## 2022-08-07 DIAGNOSIS — I69354 Hemiplegia and hemiparesis following cerebral infarction affecting left non-dominant side: Secondary | ICD-10-CM | POA: Diagnosis not present

## 2022-08-07 DIAGNOSIS — R4182 Altered mental status, unspecified: Secondary | ICD-10-CM | POA: Diagnosis not present

## 2022-08-07 DIAGNOSIS — E1122 Type 2 diabetes mellitus with diabetic chronic kidney disease: Secondary | ICD-10-CM | POA: Diagnosis not present

## 2022-08-07 DIAGNOSIS — I639 Cerebral infarction, unspecified: Secondary | ICD-10-CM | POA: Diagnosis not present

## 2022-08-07 DIAGNOSIS — R638 Other symptoms and signs concerning food and fluid intake: Secondary | ICD-10-CM | POA: Diagnosis not present

## 2022-08-07 DIAGNOSIS — N183 Chronic kidney disease, stage 3 unspecified: Secondary | ICD-10-CM | POA: Diagnosis not present

## 2022-08-07 DIAGNOSIS — R54 Age-related physical debility: Secondary | ICD-10-CM | POA: Diagnosis not present

## 2022-08-07 DIAGNOSIS — R5381 Other malaise: Secondary | ICD-10-CM | POA: Diagnosis present

## 2022-08-07 DIAGNOSIS — Z7189 Other specified counseling: Secondary | ICD-10-CM | POA: Diagnosis not present

## 2022-08-07 DIAGNOSIS — E876 Hypokalemia: Secondary | ICD-10-CM | POA: Diagnosis present

## 2022-08-07 DIAGNOSIS — R252 Cramp and spasm: Secondary | ICD-10-CM | POA: Diagnosis not present

## 2022-08-07 DIAGNOSIS — Z841 Family history of disorders of kidney and ureter: Secondary | ICD-10-CM | POA: Diagnosis not present

## 2022-08-07 DIAGNOSIS — R4781 Slurred speech: Secondary | ICD-10-CM | POA: Diagnosis present

## 2022-08-07 DIAGNOSIS — Z825 Family history of asthma and other chronic lower respiratory diseases: Secondary | ICD-10-CM

## 2022-08-07 DIAGNOSIS — R29707 NIHSS score 7: Secondary | ICD-10-CM | POA: Diagnosis present

## 2022-08-07 DIAGNOSIS — E785 Hyperlipidemia, unspecified: Secondary | ICD-10-CM | POA: Diagnosis present

## 2022-08-07 DIAGNOSIS — N184 Chronic kidney disease, stage 4 (severe): Secondary | ICD-10-CM | POA: Diagnosis not present

## 2022-08-07 DIAGNOSIS — I63012 Cerebral infarction due to thrombosis of left vertebral artery: Secondary | ICD-10-CM | POA: Diagnosis not present

## 2022-08-07 DIAGNOSIS — Z79899 Other long term (current) drug therapy: Secondary | ICD-10-CM

## 2022-08-07 DIAGNOSIS — Z885 Allergy status to narcotic agent status: Secondary | ICD-10-CM | POA: Diagnosis not present

## 2022-08-07 DIAGNOSIS — R41844 Frontal lobe and executive function deficit: Secondary | ICD-10-CM | POA: Diagnosis not present

## 2022-08-07 DIAGNOSIS — I69398 Other sequelae of cerebral infarction: Secondary | ICD-10-CM | POA: Diagnosis not present

## 2022-08-07 DIAGNOSIS — I10 Essential (primary) hypertension: Secondary | ICD-10-CM | POA: Diagnosis present

## 2022-08-07 DIAGNOSIS — E119 Type 2 diabetes mellitus without complications: Secondary | ICD-10-CM | POA: Diagnosis not present

## 2022-08-07 DIAGNOSIS — R531 Weakness: Secondary | ICD-10-CM | POA: Diagnosis not present

## 2022-08-07 DIAGNOSIS — Z66 Do not resuscitate: Secondary | ICD-10-CM | POA: Diagnosis not present

## 2022-08-07 DIAGNOSIS — G464 Cerebellar stroke syndrome: Secondary | ICD-10-CM | POA: Diagnosis not present

## 2022-08-07 DIAGNOSIS — Z8673 Personal history of transient ischemic attack (TIA), and cerebral infarction without residual deficits: Secondary | ICD-10-CM | POA: Diagnosis not present

## 2022-08-07 DIAGNOSIS — Z1152 Encounter for screening for COVID-19: Secondary | ICD-10-CM

## 2022-08-07 DIAGNOSIS — I6381 Other cerebral infarction due to occlusion or stenosis of small artery: Principal | ICD-10-CM | POA: Diagnosis present

## 2022-08-07 DIAGNOSIS — E860 Dehydration: Secondary | ICD-10-CM | POA: Diagnosis present

## 2022-08-07 DIAGNOSIS — E1169 Type 2 diabetes mellitus with other specified complication: Secondary | ICD-10-CM | POA: Diagnosis not present

## 2022-08-07 DIAGNOSIS — R140 Abdominal distension (gaseous): Secondary | ICD-10-CM | POA: Diagnosis not present

## 2022-08-07 DIAGNOSIS — N179 Acute kidney failure, unspecified: Secondary | ICD-10-CM | POA: Diagnosis present

## 2022-08-07 DIAGNOSIS — R29810 Facial weakness: Secondary | ICD-10-CM | POA: Diagnosis present

## 2022-08-07 DIAGNOSIS — Z993 Dependence on wheelchair: Secondary | ICD-10-CM | POA: Diagnosis not present

## 2022-08-07 DIAGNOSIS — R1312 Dysphagia, oropharyngeal phase: Secondary | ICD-10-CM | POA: Diagnosis not present

## 2022-08-07 DIAGNOSIS — Z833 Family history of diabetes mellitus: Secondary | ICD-10-CM

## 2022-08-07 DIAGNOSIS — R471 Dysarthria and anarthria: Secondary | ICD-10-CM | POA: Diagnosis present

## 2022-08-07 DIAGNOSIS — Z7982 Long term (current) use of aspirin: Secondary | ICD-10-CM

## 2022-08-07 LAB — GLUCOSE, CAPILLARY: Glucose-Capillary: 161 mg/dL — ABNORMAL HIGH (ref 70–99)

## 2022-08-07 LAB — CBC WITH DIFFERENTIAL/PLATELET
Abs Immature Granulocytes: 0.06 10*3/uL (ref 0.00–0.07)
Basophils Absolute: 0.1 10*3/uL (ref 0.0–0.1)
Basophils Relative: 1 %
Eosinophils Absolute: 0.1 10*3/uL (ref 0.0–0.5)
Eosinophils Relative: 1 %
HCT: 46.8 % — ABNORMAL HIGH (ref 36.0–46.0)
Hemoglobin: 15 g/dL (ref 12.0–15.0)
Immature Granulocytes: 0 %
Lymphocytes Relative: 13 %
Lymphs Abs: 1.7 10*3/uL (ref 0.7–4.0)
MCH: 26.6 pg (ref 26.0–34.0)
MCHC: 32.1 g/dL (ref 30.0–36.0)
MCV: 83.1 fL (ref 80.0–100.0)
Monocytes Absolute: 1 10*3/uL (ref 0.1–1.0)
Monocytes Relative: 7 %
Neutro Abs: 10.8 10*3/uL — ABNORMAL HIGH (ref 1.7–7.7)
Neutrophils Relative %: 78 %
Platelets: 363 10*3/uL (ref 150–400)
RBC: 5.63 MIL/uL — ABNORMAL HIGH (ref 3.87–5.11)
RDW: 14.3 % (ref 11.5–15.5)
WBC: 13.7 10*3/uL — ABNORMAL HIGH (ref 4.0–10.5)
nRBC: 0 % (ref 0.0–0.2)

## 2022-08-07 LAB — RESP PANEL BY RT-PCR (RSV, FLU A&B, COVID)  RVPGX2
Influenza A by PCR: NEGATIVE
Influenza B by PCR: NEGATIVE
Resp Syncytial Virus by PCR: NEGATIVE
SARS Coronavirus 2 by RT PCR: NEGATIVE

## 2022-08-07 LAB — URINALYSIS, ROUTINE W REFLEX MICROSCOPIC
Bilirubin Urine: NEGATIVE
Glucose, UA: >=500 mg/dL — AB
Hgb urine dipstick: NEGATIVE
Ketones, ur: NEGATIVE mg/dL
Leukocytes,Ua: NEGATIVE
Nitrite: NEGATIVE
Protein, ur: >=300 mg/dL — AB
Specific Gravity, Urine: 1.013 (ref 1.005–1.030)
pH: 6 (ref 5.0–8.0)

## 2022-08-07 LAB — COMPREHENSIVE METABOLIC PANEL
ALT: 23 U/L (ref 0–44)
AST: 20 U/L (ref 15–41)
Albumin: 3.4 g/dL — ABNORMAL LOW (ref 3.5–5.0)
Alkaline Phosphatase: 114 U/L (ref 38–126)
Anion gap: 13 (ref 5–15)
BUN: 38 mg/dL — ABNORMAL HIGH (ref 8–23)
CO2: 21 mmol/L — ABNORMAL LOW (ref 22–32)
Calcium: 8.9 mg/dL (ref 8.9–10.3)
Chloride: 110 mmol/L (ref 98–111)
Creatinine, Ser: 1.63 mg/dL — ABNORMAL HIGH (ref 0.44–1.00)
GFR, Estimated: 32 mL/min — ABNORMAL LOW (ref >60)
Glucose, Bld: 190 mg/dL — ABNORMAL HIGH (ref 70–99)
Potassium: 3.4 mmol/L — ABNORMAL LOW (ref 3.5–5.1)
Sodium: 144 mmol/L (ref 135–145)
Total Bilirubin: 0.9 mg/dL (ref 0.3–1.2)
Total Protein: 7 g/dL (ref 6.5–8.1)

## 2022-08-07 LAB — MAGNESIUM: Magnesium: 2.2 mg/dL (ref 1.7–2.4)

## 2022-08-07 MED ORDER — ASPIRIN 325 MG PO TABS
325.0000 mg | ORAL_TABLET | Freq: Once | ORAL | Status: DC
  Filled 2022-08-07: qty 1

## 2022-08-07 MED ORDER — SODIUM CHLORIDE 0.9 % IV SOLN: INTRAVENOUS | Status: DC

## 2022-08-07 MED ORDER — ACETAMINOPHEN 325 MG PO TABS: 650.0000 mg | ORAL_TABLET | ORAL | Status: DC | PRN

## 2022-08-07 MED ORDER — ACETAMINOPHEN 650 MG RE SUPP: 650.0000 mg | RECTAL | Status: DC | PRN

## 2022-08-07 MED ORDER — ACETAMINOPHEN 160 MG/5ML PO SOLN: 650.0000 mg | ORAL | Status: DC | PRN

## 2022-08-07 MED ORDER — ASPIRIN 300 MG RE SUPP: 300.0000 mg | Freq: Every day | RECTAL | Status: DC

## 2022-08-07 MED ORDER — STROKE: EARLY STAGES OF RECOVERY BOOK
Freq: Once | Status: AC
  Filled 2022-08-07: qty 1

## 2022-08-07 MED ORDER — POTASSIUM CHLORIDE 10 MEQ/100ML IV SOLN
10.0000 meq | INTRAVENOUS | Status: AC
  Administered 2022-08-07 (×4): 10 meq via INTRAVENOUS
  Filled 2022-08-07 (×4): qty 100

## 2022-08-07 MED ORDER — SODIUM CHLORIDE 0.9 % IV BOLUS
500.0000 mL | Freq: Once | INTRAVENOUS | Status: AC
  Administered 2022-08-07: 500 mL via INTRAVENOUS

## 2022-08-07 NOTE — H&P (Signed)
History and Physical    Patient: [name] LMB:[id number] DOB: 04/17/1946 DOA: 08/07/2022 DOS: the patient was seen and examined on 08/07/2022 PCP: Lauree Chandler, NP  Patient coming from: Home  Chief Complaint:  Chief Complaint  Patient presents with   Emesis   HPI: [name] is a 77 y.o. female with medical history significant of CVA w/ left side residuals, HTN, HLD, DM2, CKD4. Presenting with slurred speech and difficulty swallowing. She reports that she was in her normal state of health until about 5 days ago. Her daughter noticed that she was slurring her speech and was having more difficulty with swallowing. She seemed to have increased saliva output the seemed to make her nauseous. She had a couple of episodes of vomiting. She didn't have any fever or abdominal pain. Of note, she had a recent suspected UTI, but was able to finish a course of keflex w/o difficulty. Her PO intake has been poorer over the last several days. She seems more confused and fatigued. When her symptoms did not improve today; she decided to come to the ED for evaluation. She denies any other aggravating or alleviating factors.   Review of Systems: As mentioned in the history of present illness. All other systems reviewed and are negative. Past Medical History:  Diagnosis Date   Allergy    Arthritis    Broken ankle    Cataract    Diabetes mellitus without complication ([place])    History of colonoscopy    Hypertension    Macular degeneration syndrome    with edema---being treated.    Stage 4 chronic kidney disease ([place])    Stroke ([place]) 2019   Past Surgical History:  Procedure Laterality Date   callous removal  Left 2017   located on 1st digit on L foot 2/2 diabetes   EYE SURGERY     Social History:  reports that she has never smoked. She has never used smokeless tobacco. She reports that she does not drink alcohol and does not use drugs.  Allergies  Allergen Reactions   Cephalexin  Diarrhea and Nausea And Vomiting   Clonidine Anaphylaxis, Swelling and Other (See Comments)    Tongue swelling   Norvasc [Amlodipine Besylate] Swelling    Edema and TONGUE swelling    Lisinopril Cough   Morphine And Related    Amlodipine Anxiety, Nausea Only and Swelling   Codeine Itching and Rash   Crestor [Rosuvastatin] Rash    Red and flushed in her face    Family History  Problem Relation Age of Onset   Kidney disease Mother    Congestive Heart Failure Father    COPD Father    COPD Brother    Diabetes Maternal Aunt     Prior to Admission medications   Medication Sig Start Date End Date Taking? Authorizing Provider  Accu-Chek Softclix Lancets lancets Use to test blood sugar twice daily. Dx: E11.22 11/06/21   Lauree Chandler, NP  acetaminophen (RA ACETAMINOPHEN) 650 MG CR tablet Take 1 tablet (650 mg total) by mouth every 8 (eight) hours as needed for pain. 03/07/21   Medina-Vargas, Senaida Lange, NP  AMBULATORY NON FORMULARY MEDICATION Medication Name: CBD Oil daily    [provider]  aspirin 81 MG EC tablet Take 1 tablet (81 mg total) by mouth daily. Swallow whole. 09/07/21   Lauree Chandler, NP  Blood Glucose Calibration (ACCU-CHEK GUIDE CONTROL) LIQD Use in Checking blood sugar twice daily. Dx: E11.22 11/06/21  Lauree Chandler, NP  Blood Glucose Monitoring Suppl (ACCU-CHEK GUIDE ME) w/Device KIT Use to test blood sugar twice daily. Dx: E11.22 01/31/22   Lauree Chandler, NP  cephALEXin (KEFLEX) 500 MG capsule Take 1 capsule (500 mg total) by mouth 4 (four) times daily. 07/30/22   Pattricia Boss, MD  cholecalciferol (VITAMIN D3) 25 MCG (1000 UNIT) tablet Take 1,000 Units by mouth 2 (two) times a week.    [provider]  ezetimibe (ZETIA) 10 MG tablet Take 1 tablet (10 mg total) by mouth daily. 02/26/22   Lauree Chandler, NP  fenofibrate (TRICOR) 145 MG tablet Take 1 tablet (145 mg total) by mouth daily. 07/12/21   Lauree Chandler, NP  glucose blood  (ACCU-CHEK GUIDE) test strip Use to check blood sugar twice daily. Dx: E11.22 05/21/22   Lauree Chandler, NP  ketoconazole (NIZORAL) 2 % cream Apply to feet twice a day for 3 weeks 02/26/22   Bronson Ing, DPM  loratadine (CLARITIN) 10 MG tablet Take 1 tablet (10 mg total) by mouth daily. 11/26/21   Lauree Chandler, NP  losartan (COZAAR) 25 MG tablet TAKE 1 TABLET BY MOUTH TWICE A DAY 04/26/22   Lauree Chandler, NP  Probiotic Product (PROBIOTIC DAILY PO) Take by mouth.    [provider]  sertraline (ZOLOFT) 25 MG tablet Take 1 tablet (25 mg total) by mouth daily. Diagnosis Association: Anxiety (F41.9) 06/20/21   Lauree Chandler, NP    Physical Exam: Vitals:   08/07/22 0930 08/07/22 1005 08/07/22 1216 08/07/22 1400  BP: (!) 187/79 (!) 181/87 (!) 210/74 (!) 190/64  Pulse: 82 73 75 77  Resp: 18 18 19 18   Temp:   98 F (36.7 C)   TempSrc:   Oral   SpO2: 97% 98% 98% 98%   General: 77 y.o. female resting in bed in NAD Eyes: PERRL, normal sclera ENMT: Nares patent w/o discharge, orophaynx clear, dentition normal, ears w/o discharge/lesions/ulcers Neck: Supple, trachea midline Cardiovascular: RRR, +S1, S2, no m/g/r, equal pulses throughout Respiratory: CTABL, no w/r/r, normal WOB GI: BS+, NDNT, no masses noted, no organomegaly noted MSK: No e/c/c; chronic right ankle deformity Neuro: A&O x 3, chronic left side UE/LE weakness/LUE contracture, new left facial droop  Psyc: Appropriate interaction and affect, calm/cooperative  Data Reviewed:  Results for orders placed or performed during the hospital encounter of 08/07/22 (from the past 24 hour(s))  CBC with Differential     Status: Abnormal   Collection Time: 08/07/22  8:22 AM  Result Value Ref Range   WBC 13.7 (H) 4.0 - 10.5 K/uL   RBC 5.63 (H) 3.87 - 5.11 MIL/uL   Hemoglobin 15.0 12.0 - 15.0 g/dL   HCT 46.8 (H) 36.0 - 46.0 %   MCV 83.1 80.0 - 100.0 fL   MCH 26.6 26.0 - 34.0 pg   MCHC 32.1 30.0 - 36.0 g/dL    RDW 14.3 11.5 - 15.5 %   Platelets 363 150 - 400 K/uL   nRBC 0.0 0.0 - 0.2 %   Neutrophils Relative % 78 %   Neutro Abs 10.8 (H) 1.7 - 7.7 K/uL   Lymphocytes Relative 13 %   Lymphs Abs 1.7 0.7 - 4.0 K/uL   Monocytes Relative 7 %   Monocytes Absolute 1.0 0.1 - 1.0 K/uL   Eosinophils Relative 1 %   Eosinophils Absolute 0.1 0.0 - 0.5 K/uL   Basophils Relative 1 %   Basophils Absolute 0.1 0.0 - 0.1 K/uL  Immature Granulocytes 0 %   Abs Immature Granulocytes 0.06 0.00 - 0.07 K/uL  Comprehensive metabolic panel     Status: Abnormal   Collection Time: 08/07/22  8:22 AM  Result Value Ref Range   Sodium 144 135 - 145 mmol/L   Potassium 3.4 (L) 3.5 - 5.1 mmol/L   Chloride 110 98 - 111 mmol/L   CO2 21 (L) 22 - 32 mmol/L   Glucose, Bld 190 (H) 70 - 99 mg/dL   BUN 38 (H) 8 - 23 mg/dL   Creatinine, Ser 1.63 (H) 0.44 - 1.00 mg/dL   Calcium 8.9 8.9 - 10.3 mg/dL   Total Protein 7.0 6.5 - 8.1 g/dL   Albumin 3.4 (L) 3.5 - 5.0 g/dL   AST 20 15 - 41 U/L   ALT 23 0 - 44 U/L   Alkaline Phosphatase 114 38 - 126 U/L   Total Bilirubin 0.9 0.3 - 1.2 mg/dL   GFR, Estimated 32 (L) >60 mL/min   Anion gap 13 5 - 15  Resp panel by RT-PCR (RSV, Flu A&B, Covid) Anterior Nasal Swab     Status: None   Collection Time: 08/07/22  9:11 AM   Specimen: Anterior Nasal Swab  Result Value Ref Range   SARS Coronavirus 2 by RT PCR NEGATIVE NEGATIVE   Influenza A by PCR NEGATIVE NEGATIVE   Influenza B by PCR NEGATIVE NEGATIVE   Resp Syncytial Virus by PCR NEGATIVE NEGATIVE  Urinalysis, Routine w reflex microscopic -Urine, Clean Catch     Status: Abnormal   Collection Time: 08/07/22  9:41 AM  Result Value Ref Range   Color, Urine YELLOW YELLOW   APPearance CLEAR CLEAR   Specific Gravity, Urine 1.013 1.005 - 1.030   pH 6.0 5.0 - 8.0   Glucose, UA >=500 (A) NEGATIVE mg/dL   Hgb urine dipstick NEGATIVE NEGATIVE   Bilirubin Urine NEGATIVE NEGATIVE   Ketones, ur NEGATIVE NEGATIVE mg/dL   Protein, ur >=300 (A)  NEGATIVE mg/dL   Nitrite NEGATIVE NEGATIVE   Leukocytes,Ua NEGATIVE NEGATIVE   RBC / HPF 0-5 0 - 5 RBC/hpf   WBC, UA 0-5 0 - 5 WBC/hpf   Bacteria, UA RARE (A) NONE SEEN   Squamous Epithelial / HPF 0-5 0 - 5 /HPF   Mucus PRESENT    CXR: Chronic lung changes without acute findings.   CTH: 1. Multiple remote infarcts. 2. Atrophy and small vessel disease. 3. No evidence for acute intracranial abnormality.  MRI Brain: 1. 4 mm acute left medullary infarct. 2. Progressive chronic small vessel ischemic disease since 2019 with multiple chronic infarcts as above.  Assessment and Plan: Acute CVA     - admit to inpt, tele     - neurology onboard, appreciate assistance     - per their recommendation: DAPT x 3 weeks; no further imaging, continue home HLD meds after swallow screen pass, PT/OT/SLP consult; checking lipids and A1c     - permissive HTN for now     - neuro checks  HTN     - permissive HTN for now  HLD     - resume home regimen  DM2     - A1c, SSI, glucose checks  CKD4     - baseline Scr is 1.9-2.0; she is better than baseline now; trend  Hypokalemia     - replace K+; check Mg2+  Advance Care Planning:   Code Status: DNI; confirmed through multiple lines of questioning.   Consults: Neurology  Family Communication: w/  daughter at bedside  Severity of Illness: The appropriate patient status for this patient is INPATIENT. Inpatient status is judged to be reasonable and necessary in order to provide the required intensity of service to ensure the patient's safety. The patient's presenting symptoms, physical exam findings, and initial radiographic and laboratory data in the context of their chronic comorbidities is felt to place them at high risk for further clinical deterioration. Furthermore, it is not anticipated that the patient will be medically stable for discharge from the hospital within 2 midnights of admission.   * I certify that at the point of admission it is  my clinical judgment that the patient will require inpatient hospital care spanning beyond 2 midnights from the point of admission due to high intensity of service, high risk for further deterioration and high frequency of surveillance required.*  Author: Jonnie Finner, DO 08/07/2022 2:43 PM  For on call review [email or web address].

## 2022-08-07 NOTE — Consult Note (Signed)
Stroke Neurology Consultation Note  Consult Requested by: Dr. Regenia Skeeter  Reason for Consult: stroke  Consult Date: 08/07/22   The history was obtained from the daughters.  During history and examination, all items were able to obtain unless otherwise noted.  History of Present Illness:  [name] is a 77 y.o. Caucasian female with PMH of stroke in 2019, HTN, HLD, DM, CKD, recent UTI with functional decline presented to ED for new stroke.   Pt presented to ER one week ago for generalized weakness, decrease po and appetite. CT at that time no acute finding and UA showed WBC > 50. Put on Abx and discharged. However, pt continued to be lethargic at home and Abx was not able to administer. For the last one week at home, pt daughters started to note pt have worsening left facial droop, slurry speech, not able to keep food down, with intermittent N/V, lethargy and excessive saliva. Came to ER today UA negative, CT no acute finding but MRI showed left lateral medullary infarct.   Pt had stroke in 02/2018 with left sided weakness and numbness. MRI showed right frontal and insular cortex scattered infarcts, concerning for embolic source. MRA showed right M2, A3 severe stenosis. CUS neg, EF 60-65% and LDL 141, A1C 7.4. put on DAPT on discharge.   At baseline, pt still has significant left arm weakness, now with flexion contracture. Pt left leg strength not too bad, but she had right hip and right ankle fracture over the years, therefore, she is now wheelchair bound. But she was able to feed herself, talking and interacting without problem at home. Of note, earlier this week family request outpt palliative care service given pt functional decline recently.   LSN: one week ago tPA Given: No: outside window  Past Medical History:  Diagnosis Date   Allergy    Arthritis    Broken ankle    Cataract    Diabetes mellitus without complication ([place])    History of colonoscopy    Hypertension    Macular  degeneration syndrome    with edema---being treated.    Stage 4 chronic kidney disease ([place])    Stroke ([place]) 2019    Past Surgical History:  Procedure Laterality Date   callous removal  Left 2017   located on 1st digit on L foot 2/2 diabetes   EYE SURGERY      Family History  Problem Relation Age of Onset   Kidney disease Mother    Congestive Heart Failure Father    COPD Father    COPD Brother    Diabetes Maternal Aunt     Social History:  reports that she has never smoked. She has never used smokeless tobacco. She reports that she does not drink alcohol and does not use drugs.  Allergies:  Allergies  Allergen Reactions   Cephalexin Diarrhea and Nausea And Vomiting   Clonidine Anaphylaxis, Swelling and Other (See Comments)    Tongue swelling   Norvasc [Amlodipine Besylate] Swelling    Edema and TONGUE swelling    Lisinopril Cough   Morphine And Related    Amlodipine Anxiety, Nausea Only and Swelling   Codeine Itching and Rash   Crestor [Rosuvastatin] Rash    Red and flushed in her face    No current facility-administered medications on file prior to encounter.   Current Outpatient Medications on File Prior to Encounter  Medication Sig Dispense Refill   Accu-Chek Softclix Lancets lancets Use to test blood sugar twice daily.  Dx: E11.22 200 each 3   acetaminophen (RA ACETAMINOPHEN) 650 MG CR tablet Take 1 tablet (650 mg total) by mouth every 8 (eight) hours as needed for pain. 100 tablet 2   AMBULATORY NON FORMULARY MEDICATION Medication Name: CBD Oil daily     aspirin 81 MG EC tablet Take 1 tablet (81 mg total) by mouth daily. Swallow whole. 30 tablet 12   Blood Glucose Calibration (ACCU-CHEK GUIDE CONTROL) LIQD Use in Checking blood sugar twice daily. Dx: E11.22 1 each 3   Blood Glucose Monitoring Suppl (ACCU-CHEK GUIDE ME) w/Device KIT Use to test blood sugar twice daily. Dx: E11.22 1 kit 0   cephALEXin (KEFLEX) 500 MG capsule Take 1 capsule (500 mg total) by mouth  4 (four) times daily. 20 capsule 0   cholecalciferol (VITAMIN D3) 25 MCG (1000 UNIT) tablet Take 1,000 Units by mouth 2 (two) times a week.     ezetimibe (ZETIA) 10 MG tablet Take 1 tablet (10 mg total) by mouth daily. 90 tablet 1   fenofibrate (TRICOR) 145 MG tablet Take 1 tablet (145 mg total) by mouth daily. 30 tablet 1   glucose blood (ACCU-CHEK GUIDE) test strip Use to check blood sugar twice daily. Dx: E11.22 200 each 3   ketoconazole (NIZORAL) 2 % cream Apply to feet twice a day for 3 weeks 60 g 2   loratadine (CLARITIN) 10 MG tablet Take 1 tablet (10 mg total) by mouth daily. 30 tablet 3   losartan (COZAAR) 25 MG tablet TAKE 1 TABLET BY MOUTH TWICE A DAY 180 tablet 1   Probiotic Product (PROBIOTIC DAILY PO) Take by mouth.     sertraline (ZOLOFT) 25 MG tablet Take 1 tablet (25 mg total) by mouth daily. Diagnosis Association: Anxiety (F41.9) 90 tablet 1    Review of Systems: A full ROS was attempted today and was able to be performed.  Systems assessed include - Constitutional, Eyes, HENT, Respiratory, Cardiovascular, Gastrointestinal, Genitourinary, Integument/breast, Hematologic/lymphatic, Musculoskeletal, Neurological, Behavioral/Psych, Endocrine, Allergic/Immunologic - with pertinent responses as per HPI.  Physical Examination: Temp:  [97.9 F (36.6 C)-98 F (36.7 C)] 98 F (36.7 C) (01/31 1216) Pulse Rate:  [70-82] 75 (01/31 1216) Resp:  [18-19] 19 (01/31 1216) BP: (175-210)/(74-88) 210/74 (01/31 1216) SpO2:  [97 %-99 %] 98 % (01/31 1216)  General - well nourished, well developed, in no apparent distress, mild lethargy.    Ophthalmologic - fundi not visualized due to noncooperation.    Cardiovascular - regular rhythm and rate  Neuro - awake, alert, eyes open but lethargic, orientated to age, place, time and people. No aphasia, paucity of speech, following all simple commands. Able to name and repeat, but mild dysarthria. No gaze palsy, tracking bilaterally, visual field full,  PERRL. Left facial droop. Tongue midline. RUE 4/5 and LUE proximal 3/5, tricep 3/5, bicep 4/5, finger grip 3/5 but significant increased flexion tone. BLE 3/5 proximal and 4/5 distal. Sensation symmetrical bilaterally, right FTN intact, gait not tested.    Data Reviewed: [name] CONTRAST  Result Date: 08/07/2022 CLINICAL DATA:  Transient ischemic attack (TIA). Left facial droop. Nausea and vomiting. EXAM: MRI HEAD WITHOUT CONTRAST TECHNIQUE: Multiplanar, multiecho pulse sequences of the brain and surrounding structures were obtained without intravenous contrast. COMPARISON:  Head CT 08/07/2022 and MRI 02/24/2018 FINDINGS: Brain: There is a 4 mm acute infarct in the upper left medulla. No mass, midline shift, or extra-axial fluid collection is identified. A moderate-sized chronic right parietal infarct and chronic bilateral corona radiata/basal ganglia lacunar  infarcts are again noted. There are chronic blood products associated with an old right basal ganglia infarct. T2 hyperintensities elsewhere in the cerebral white matter bilaterally have progressed from the prior MRI and are nonspecific but compatible with chronic small vessel ischemic disease. Chronic bilateral cerebellar and pontine infarcts are largely new from 2019. There is moderate cerebral atrophy. Vascular: Major intracranial vascular flow voids are preserved. Skull and upper cervical spine: Unremarkable bone marrow signal. Sinuses/Orbits: Bilateral cataract extraction. Paranasal sinuses and mastoid air cells are clear. Other: None. IMPRESSION: 1. 4 mm acute left medullary infarct. 2. Progressive chronic small vessel ischemic disease since 2019 with multiple chronic infarcts as above. Electronically Signed   By: Logan Bores M.D.   On: 08/07/2022 12:01   CT HEAD WO CONTRAST (5MM)  Result Date: 08/07/2022 CLINICAL DATA:  Transient ischemic attack.  Altered mental status. EXAM: CT HEAD WITHOUT CONTRAST TECHNIQUE: Contiguous axial images were  obtained from the base of the skull through the vertex without intravenous contrast. RADIATION DOSE REDUCTION: This exam was performed according to the departmental dose-optimization program which includes automated exposure control, adjustment of the mA and/or kV according to patient size and/or use of iterative reconstruction technique. COMPARISON:  07/30/2022 FINDINGS: Brain: There is encephalomalacia of the RIGHT posterior parietal lobe. There are remote infarcts of the RIGHT corona radiata and bilateral basal ganglia. Significant periventricular white matter changes are present. There is central and cortical atrophy. There is a remote RIGHT cerebellar infarct. There is no intra or extra-axial fluid collection or mass lesion. The basilar cisterns and ventricles have a normal appearance. There is no CT evidence for acute infarction or hemorrhage. Vascular: There is dense atherosclerotic calcification of the internal carotid arteries. No hyperdense vessels. Skull: Normal. Negative for fracture or focal lesion. Sinuses/Orbits: No acute finding. Other: None. IMPRESSION: 1. Multiple remote infarcts. 2. Atrophy and small vessel disease. 3. No evidence for acute intracranial abnormality. Electronically Signed   By: Nolon Nations M.D.   On: 08/07/2022 10:50   DG Chest 2 View  Result Date: 08/07/2022 CLINICAL DATA:  Weakness. EXAM: CHEST - 2 VIEW COMPARISON:  09/22/2018 FINDINGS: Coarse lung markings are suggestive for chronic changes. No evidence for airspace disease or pulmonary edema. Heart and mediastinum are within normal limits. Atherosclerotic calcifications at the aortic arch. Irregularity of the posterior left seventh rib likely represents an old fracture. No pleural effusions. IMPRESSION: Chronic lung changes without acute findings. Electronically Signed   By: Markus Daft M.D.   On: 08/07/2022 10:06   CT HEAD WO CONTRAST  Result Date: 07/30/2022 CLINICAL DATA:  Generalized weakness EXAM: CT HEAD  WITHOUT CONTRAST TECHNIQUE: Contiguous axial images were obtained from the base of the skull through the vertex without intravenous contrast. RADIATION DOSE REDUCTION: This exam was performed according to the departmental dose-optimization program which includes automated exposure control, adjustment of the mA and/or kV according to patient size and/or use of iterative reconstruction technique. COMPARISON:  No prior CT head, correlation is made with MRI head 02/24/2018 FINDINGS: Brain: No evidence of acute infarction, hemorrhage, mass, mass effect, or midline shift. No hydrocephalus or extra-axial fluid collection. Encephalomalacia in the right parietal lobe, right corona radiata lacunar infarct, and bilateral basal ganglia lacunar infarcts, which appear similar to the prior MRI when accounting for differences in technique. Additional remote infarct in the right cerebellum appears new compared to 2019. Periventricular white matter changes, likely the sequela of chronic small vessel ischemic disease. Vascular: No hyperdense vessel. Atherosclerotic calcifications in the  intracranial carotid and vertebral arteries. Skull: Negative for fracture or focal lesion. Sinuses/Orbits: Clear paranasal sinuses. Status post bilateral lens replacements. Other: The mastoid air cells are well aerated. IMPRESSION: No acute intracranial process. Electronically Signed   By: Merilyn Baba M.D.   On: 07/30/2022 16:31    Assessment: 77 y.o. female with PMH of stroke in 2019 with residue left sided weakness, HTN, HLD, DM, CKD, right hip and ankle fracture and now wheelchair bound, recent UTI with functional decline presented to ED for worsening left facial droop, slurry speech, not able to keep food down, with intermittent N/V, lethargy and excessive saliva. In ER today UA negative, CT no acute finding but MRI showed left lateral medullary infarct. Discussed with daughters, they are not interested further stroke work up with MRA, CUS or  TTE as it will not change much stroke management. But pt and daughters are OK with speech evaluation and temporary tube feeding if needed. They would like PT/OT. Will give ASA suppository until po access when we can consider DAPT and statin.   Stroke Risk Factors - diabetes mellitus, hypercoagulable state, hypertension, and hx of stroke  Plan: Admit to [facility] and we will follow along for stroke needs Frequent neuro checks Telemetry monitoring fasting lipid panel and HgbA1C PT/OT/speech consult. Family OK with tube feeding if needed in short term.  Gradually normalize BP in 2-3 days GI and DVT prophylaxis  ASA PR 300 mg now given no PO access. Once pass swallow, will consider ASA 81 and plavix 75 DAPT for 3 weeks and then ASA alone. Can resume home zetia and fenofibrate once pass swallow Discussed with [facility] ED PA We will follow   Thank you for this consultation and allowing [place] to participate in the care of this patient.  Rosalin Hawking, MD PhD Stroke Neurology 08/07/2022 2:08 PM

## 2022-08-07 NOTE — ED Triage Notes (Signed)
[facility] reports pt having n/v since last night, recent UTI with antibiotics.

## 2022-08-07 NOTE — ED Provider Notes (Signed)
[place] [facility] AT [facility] Provider Note   CSN: [id number] Arrival date & time: 08/07/22  0801     History CVA, HTN, DM,  Chief Complaint  Patient presents with   Emesis    [name] is a 77 y.o. female.  77 y.o female with a PMH fo CVA, DM, HTN presents to the ED via POV accompanied by her daughter with a chief complaint of worsening weakness.  Patient was diagnosed with a urinary tract infection approximately 1 week ago, she was placed on antibiotics took these for 5 days, however that has been worsening decline in her mental status.  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  She has completed the course of antibiotics, there has not been any improvement in her symptoms.  Patient did have 1 episode of vomiting prior to arrival in the ED, given Zofran by EMS without any further vomiting.  No fevers, no upper respiratory symptoms, no abdominal pain, no other complaints.    The history is provided by the patient and medical records.  Emesis Severity:  Mild Associated symptoms: no abdominal pain, no diarrhea, no fever and no sore throat        Home Medications Prior to Admission medications   Medication Sig Start Date End Date Taking? Authorizing Provider  Accu-Chek Softclix Lancets lancets Use to test blood sugar twice daily. Dx: E11.22 11/06/21   Lauree Chandler, NP  acetaminophen (RA ACETAMINOPHEN) 650 MG CR tablet Take 1 tablet (650 mg total) by mouth every 8 (eight) hours as needed for pain. 03/07/21   Medina-Vargas, Senaida Lange, NP  AMBULATORY NON FORMULARY MEDICATION Medication Name: CBD Oil daily    [provider]  aspirin 81 MG EC  tablet Take 1 tablet (81 mg total) by mouth daily. Swallow whole. 09/07/21   Lauree Chandler, NP  Blood Glucose Calibration (ACCU-CHEK GUIDE CONTROL) LIQD Use in Checking blood sugar twice daily. Dx: E11.22 11/06/21   Lauree Chandler, NP  Blood Glucose Monitoring Suppl (ACCU-CHEK GUIDE ME) w/Device KIT Use to test blood sugar twice daily. Dx: E11.22 01/31/22   Lauree Chandler, NP  cephALEXin (KEFLEX) 500 MG capsule Take 1 capsule (500 mg total) by mouth 4 (four) times daily. 07/30/22   Pattricia Boss, MD  cholecalciferol (VITAMIN D3) 25 MCG (1000 UNIT) tablet Take 1,000 Units by mouth 2 (two) times a week.    [provider]  ezetimibe (ZETIA) 10 MG tablet Take 1 tablet (10 mg total) by mouth daily. 02/26/22   Lauree Chandler, NP  fenofibrate (TRICOR) 145 MG tablet Take 1 tablet (145 mg total) by mouth daily. 07/12/21   Lauree Chandler, NP  glucose blood (ACCU-CHEK GUIDE) test strip Use to check blood sugar twice daily. Dx: E11.22 05/21/22   Lauree Chandler, NP  ketoconazole (NIZORAL) 2 % cream Apply to feet twice a day for 3 weeks 02/26/22   Bronson Ing, DPM  loratadine (CLARITIN) 10 MG tablet Take 1 tablet (10 mg total) by mouth daily. 11/26/21   Lauree Chandler, NP  losartan (COZAAR) 25 MG tablet TAKE 1 TABLET BY MOUTH TWICE A DAY 04/26/22   Sherrie Mustache  K, NP  Probiotic Product (PROBIOTIC DAILY PO) Take by mouth.    [provider]  sertraline (ZOLOFT) 25 MG tablet Take 1 tablet (25 mg total) by mouth daily. Diagnosis Association: Anxiety (F41.9) 06/20/21   Lauree Chandler, NP      Allergies    Cephalexin, Clonidine, Norvasc [amlodipine besylate], Lisinopril, Morphine and related, Amlodipine, Codeine, and Crestor [rosuvastatin]    Review of Systems   Review of Systems  Constitutional:  Negative for fever.  HENT:  Negative for sore throat.   Respiratory:  Negative for shortness of breath.   Cardiovascular:  Negative for chest pain.   Gastrointestinal:  Positive for vomiting. Negative for abdominal pain, diarrhea and nausea.  Genitourinary:  Negative for flank pain.  Neurological:  Positive for facial asymmetry and weakness.  All other systems reviewed and are negative.   Physical Exam Updated Vital Signs BP (!) 210/74 (BP Location: Right Arm)   Pulse 75   Temp 98 F (36.7 C) (Oral)   Resp 19   SpO2 98%  Physical Exam Vitals and nursing note reviewed.  Constitutional:      Appearance: Normal appearance.  HENT:     Head: Normocephalic and atraumatic.     Mouth/Throat:     Mouth: Mucous membranes are moist.  Eyes:     Pupils: Pupils are equal, round, and reactive to light.  Cardiovascular:     Rate and Rhythm: Normal rate.  Pulmonary:     Effort: Pulmonary effort is normal.     Breath sounds: No wheezing.     Comments: Decreased breath sounds Abdominal:     General: Abdomen is flat.     Palpations: Abdomen is soft.  Skin:    General: Skin is warm and dry.  Neurological:     Mental Status: She is alert and oriented to person, place, and time.     Cranial Nerves: Dysarthria and facial asymmetry present.     Comments: LEFT hemiplegia Full movement of the right extremities Facial asymmetry noted, NOT at baseline her daughter     ED Results / Procedures / Treatments   Labs (all labs ordered are listed, but only abnormal results are displayed) Labs Reviewed  CBC WITH DIFFERENTIAL/PLATELET - Abnormal; Notable for the following components:      Result Value   WBC 13.7 (*)    RBC 5.63 (*)    HCT 46.8 (*)    Neutro Abs 10.8 (*)    All other components within normal limits  COMPREHENSIVE METABOLIC PANEL - Abnormal; Notable for the following components:   Potassium 3.4 (*)    CO2 21 (*)    Glucose, Bld 190 (*)    BUN 38 (*)    Creatinine, Ser 1.63 (*)    Albumin 3.4 (*)    GFR, Estimated 32 (*)    All other components within normal limits  URINALYSIS, ROUTINE W REFLEX MICROSCOPIC - Abnormal;  Notable for the following components:   Glucose, UA >=500 (*)    Protein, ur >=300 (*)    Bacteria, UA RARE (*)    All other components within normal limits  RESP PANEL BY RT-PCR (RSV, FLU A&B, COVID)  RVPGX2  CBG MONITORING, ED    EKG None  Radiology [name] CONTRAST  Result Date: 08/07/2022 CLINICAL DATA:  Transient ischemic attack (TIA). Left facial droop. Nausea and vomiting. EXAM: MRI HEAD WITHOUT CONTRAST TECHNIQUE: Multiplanar, multiecho pulse sequences of the brain and surrounding structures were obtained without intravenous contrast.  COMPARISON:  Head CT 08/07/2022 and MRI 02/24/2018 FINDINGS: Brain: There is a 4 mm acute infarct in the upper left medulla. No mass, midline shift, or extra-axial fluid collection is identified. A moderate-sized chronic right parietal infarct and chronic bilateral corona radiata/basal ganglia lacunar infarcts are again noted. There are chronic blood products associated with an old right basal ganglia infarct. T2 hyperintensities elsewhere in the cerebral white matter bilaterally have progressed from the prior MRI and are nonspecific but compatible with chronic small vessel ischemic disease. Chronic bilateral cerebellar and pontine infarcts are largely new from 2019. There is moderate cerebral atrophy. Vascular: Major intracranial vascular flow voids are preserved. Skull and upper cervical spine: Unremarkable bone marrow signal. Sinuses/Orbits: Bilateral cataract extraction. Paranasal sinuses and mastoid air cells are clear. Other: None. IMPRESSION: 1. 4 mm acute left medullary infarct. 2. Progressive chronic small vessel ischemic disease since 2019 with multiple chronic infarcts as above. Electronically Signed   By: Logan Bores M.D.   On: 08/07/2022 12:01   CT HEAD WO CONTRAST (5MM)  Result Date: 08/07/2022 CLINICAL DATA:  Transient ischemic attack.  Altered mental status. EXAM: CT HEAD WITHOUT CONTRAST TECHNIQUE: Contiguous axial images were obtained  from the base of the skull through the vertex without intravenous contrast. RADIATION DOSE REDUCTION: This exam was performed according to the departmental dose-optimization program which includes automated exposure control, adjustment of the mA and/or kV according to patient size and/or use of iterative reconstruction technique. COMPARISON:  07/30/2022 FINDINGS: Brain: There is encephalomalacia of the RIGHT posterior parietal lobe. There are remote infarcts of the RIGHT corona radiata and bilateral basal ganglia. Significant periventricular white matter changes are present. There is central and cortical atrophy. There is a remote RIGHT cerebellar infarct. There is no intra or extra-axial fluid collection or mass lesion. The basilar cisterns and ventricles have a normal appearance. There is no CT evidence for acute infarction or hemorrhage. Vascular: There is dense atherosclerotic calcification of the internal carotid arteries. No hyperdense vessels. Skull: Normal. Negative for fracture or focal lesion. Sinuses/Orbits: No acute finding. Other: None. IMPRESSION: 1. Multiple remote infarcts. 2. Atrophy and small vessel disease. 3. No evidence for acute intracranial abnormality. Electronically Signed   By: Nolon Nations M.D.   On: 08/07/2022 10:50   DG Chest 2 View  Result Date: 08/07/2022 CLINICAL DATA:  Weakness. EXAM: CHEST - 2 VIEW COMPARISON:  09/22/2018 FINDINGS: Coarse lung markings are suggestive for chronic changes. No evidence for airspace disease or pulmonary edema. Heart and mediastinum are within normal limits. Atherosclerotic calcifications at the aortic arch. Irregularity of the posterior left seventh rib likely represents an old fracture. No pleural effusions. IMPRESSION: Chronic lung changes without acute findings. Electronically Signed   By: Markus Daft M.D.   On: 08/07/2022 10:06    Procedures Procedures    Medications Ordered in ED Medications  sodium chloride 0.9 % bolus 500 mL (0 mLs  Intravenous Stopped 08/07/22 1312)    ED Course/ Medical Decision Making/ A&P                             Medical Decision Making Amount and/or Complexity of Data Reviewed Labs: ordered. Radiology: ordered.   This patient presents to the ED for concern of weakness, this involves a number of treatment options, and is a complaint that carries with it a high risk of complications and morbidity.  The differential diagnosis includes infection, CVA versus metabolic derangement.  Co morbidities: Discussed in HPI   Brief History:  Patient presents to the ED brought in by daughter for concerns of worsening weakness.  Diagnosed with a UTI approximately 1 week ago, completed a course of antibiotics and has not had any improvement in symptoms.  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  Some decrease in strength along with fatigue.  EMR reviewed including pt PMHx, past surgical history and past visits to ER.   See HPI for more details   Lab Tests:  I ordered and independently interpreted labs.  The pertinent results include:    I personally reviewed all laboratory work and imaging. Metabolic panel without any acute abnormality specifically kidney function within normal limits and no significant electrolyte abnormalities. CBC without leukocytosis or significant anemia.  Imaging Studies:  CT Head showed: 1. Multiple remote infarcts.  2. Atrophy and small vessel disease.  3. No evidence for acute intracranial abnormality.   MRI Brain showed: . 4 mm acute left medullary infarct.  2. Progressive chronic small vessel ischemic disease since 2019 with  multiple chronic infarcts as above.   Medicines ordered:  I ordered medication including bolus  for hydration Reevaluation of the patient after these medicines showed that the patient stayed the same I have reviewed the patients home  medicines and have made adjustments as needed Consults:  I requested consultation with Neurology Dr. Glori Bickers,  and discussed lab and imaging findings as well as pertinent plan - they recommend: Admission for stroke workup via medicine.  Reevaluation:  After the interventions noted above I re-evaluated patient and found that they have :stayed the same   Social Determinants of Health:  The patient's social determinants of health were a factor in the care of this patient  Problem List / ED Course:  Patient here with worsening decline in baseline over the past week.  Treated for urinary tract infection about a week ago, prescribed Keflex although urine culture came back with multiple specimens.  According to daughter, urine was collected via bedpan and not In-N-Out cath.  She finished a course of antibiotics and daughter has not seen any more improvement in her mother symptoms.  She reports increase in secretions, she is likely choking on saliva and coughing which cause a nausea and vomiting episode this morning.  On exam patient does have left-sided facial asymmetry, this is not at her baseline according to daughter.  She does have residual deficits from her prior stroke such as left-sided hemiplegia.  Vitals are otherwise stable. Interpretation of her labs reveal a CBC with a leukocytosis of 13.7, CMP with slight decrease in potassium.  I am getting behind on level is improved from last time.  LFTs are within normal limits.  UA negative for any infection at this time.  Respiratory panel is also negative.  CT of her head did not show any acute infarct although multiple infarcts were noted. I did discuss changes with patient may be being deconditioning versus acute stroke that might need further evaluation via MRI.  MRI brain was ordered which that show an acute stroke.  I discussed the case with neurology Dr. Glori Bickers they will be evaluating patient while at [facility] long, they do request admission  at this time at [facility] long as [facility] does not have any beds available.  I discussed this with patient's daughter and patient at the bedside who are agreeable of staying.  Patient did receive some fluids to help with hydration, no other sources of infection to account for worsening mental status.  Dispostion:  Spoke to Dr. Marylyn Ishihara of hospitalist service who will admit patient for further management.   Portions of this note were generated with Lobby[employer]. Dictation errors may occur despite best attempts at proofreading.   Final Clinical Impression(s) / ED Diagnoses Final diagnoses:  Weakness  Cerebral infarction, acute [facility])    Rx / DC Orders ED Discharge Orders     None         Janeece Fitting, PA-C 08/07/22 1338    Sherwood Gambler, MD 08/09/22 [phone]

## 2022-08-07 NOTE — Plan of Care (Signed)

## 2022-08-07 NOTE — ED Notes (Signed)
ED TO INPATIENT HANDOFF REPORT  ED Nurse Name and Phone #: [facility] Name/Age/Gender [name] 77 y.o. female Room/Bed: WA14/WA14  Code Status   Code Status: Prior  Home/SNF/Other Home Patient oriented to: self, place, time, and situation Is this baseline? Yes   Triage Complete: Triage complete  Chief Complaint CVA (cerebral vascular accident) [facility]) [I63.9]  Triage Note [facility] reports pt having n/v since last night, recent UTI with antibiotics.   Allergies Allergies  Allergen Reactions   Cephalexin Diarrhea and Nausea And Vomiting   Clonidine Anaphylaxis, Swelling and Other (See Comments)    Tongue swelling   Norvasc [Amlodipine Besylate] Swelling    Edema and TONGUE swelling    Lisinopril Cough   Morphine And Related    Amlodipine Anxiety, Nausea Only and Swelling   Codeine Itching and Rash   Crestor [Rosuvastatin] Rash    Red and flushed in her face    Level of Care/Admitting Diagnosis ED Disposition     ED Disposition  Admit   Condition  --   Comment  Hospital Area: [place] [[id number]]  Level of Care: Telemetry [5]  Admit to tele based on following criteria: Monitor for Ischemic changes  May admit patient to [facility] or [facility] if equivalent level of care is available:: No  Covid Evaluation: Asymptomatic - no recent exposure (last 10 days) testing not required  Diagnosis: CVA (cerebral vascular accident) [facility]) [[id number]]  Admitting Physician: Jonnie Finner [[id number]]  Attending Physician: Jonnie Finner [[id number]]  Certification:: I certify this patient will need inpatient services for at least 2 midnights  Estimated Length of Stay: 2          B Medical/Surgery History Past Medical History:  Diagnosis Date   Allergy    Arthritis    Broken ankle    Cataract    Diabetes mellitus without complication ([place])    History of colonoscopy    Hypertension    Macular degeneration syndrome    with edema---being  treated.    Stage 4 chronic kidney disease ([place])    Stroke ([place]) 2019   Past Surgical History:  Procedure Laterality Date   callous removal  Left 2017   located on 1st digit on L foot 2/2 diabetes   EYE SURGERY       A IV Location/Drains/Wounds Patient Lines/Drains/Airways Status     Active Line/Drains/Airways     Name Placement date Placement time Site Days   Peripheral IV 08/07/22 22 G 1" Left;Posterior Hand 08/07/22  0812  Hand  less than 1   Peripheral IV 08/07/22 20 G Anterior;Proximal;Right Forearm 08/07/22  0839  Forearm  less than 1   Pressure Injury Stage I -  Intact skin with non-blanchable redness of a localized area usually over a bony prominence. --  --  -- --            Intake/Output Last 24 hours No intake or output data in the 24 hours ending 08/07/22 1433  Labs/Imaging Results for orders placed or performed during the hospital encounter of 08/07/22 (from the past 48 hour(s))  CBC with Differential     Status: Abnormal   Collection Time: 08/07/22  8:22 AM  Result Value Ref Range   WBC 13.7 (H) 4.0 - 10.5 K/uL   RBC 5.63 (H) 3.87 - 5.11 MIL/uL   Hemoglobin 15.0 12.0 - 15.0 g/dL   HCT 46.8 (H) 36.0 - 46.0 %   MCV 83.1 80.0 -  100.0 fL   MCH 26.6 26.0 - 34.0 pg   MCHC 32.1 30.0 - 36.0 g/dL   RDW 14.3 11.5 - 15.5 %   Platelets 363 150 - 400 K/uL   nRBC 0.0 0.0 - 0.2 %   Neutrophils Relative % 78 %   Neutro Abs 10.8 (H) 1.7 - 7.7 K/uL   Lymphocytes Relative 13 %   Lymphs Abs 1.7 0.7 - 4.0 K/uL   Monocytes Relative 7 %   Monocytes Absolute 1.0 0.1 - 1.0 K/uL   Eosinophils Relative 1 %   Eosinophils Absolute 0.1 0.0 - 0.5 K/uL   Basophils Relative 1 %   Basophils Absolute 0.1 0.0 - 0.1 K/uL   Immature Granulocytes 0 %   Abs Immature Granulocytes 0.06 0.00 - 0.07 K/uL    Comment: Performed at [facility], [place] [address], [place], [place] [address]  Comprehensive metabolic panel     Status: Abnormal   Collection Time: 08/07/22   8:22 AM  Result Value Ref Range   Sodium 144 135 - 145 mmol/L   Potassium 3.4 (L) 3.5 - 5.1 mmol/L   Chloride 110 98 - 111 mmol/L   CO2 21 (L) 22 - 32 mmol/L   Glucose, Bld 190 (H) 70 - 99 mg/dL    Comment: Glucose reference range applies only to samples taken after fasting for at least 8 hours.   BUN 38 (H) 8 - 23 mg/dL   Creatinine, Ser 1.63 (H) 0.44 - 1.00 mg/dL   Calcium 8.9 8.9 - 10.3 mg/dL   Total Protein 7.0 6.5 - 8.1 g/dL   Albumin 3.4 (L) 3.5 - 5.0 g/dL   AST 20 15 - 41 U/L   ALT 23 0 - 44 U/L   Alkaline Phosphatase 114 38 - 126 U/L   Total Bilirubin 0.9 0.3 - 1.2 mg/dL   GFR, Estimated 32 (L) >60 mL/min    Comment: (NOTE) Calculated using the CKD-EPI Creatinine Equation (2021)    Anion gap 13 5 - 15    Comment: Performed at [facility], [place] [address] [place], [place] [address]  Resp panel by RT-PCR (RSV, Flu A&B, Covid) Anterior Nasal Swab     Status: None   Collection Time: 08/07/22  9:11 AM   Specimen: Anterior Nasal Swab  Result Value Ref Range   SARS Coronavirus 2 by RT PCR NEGATIVE NEGATIVE    Comment: (NOTE) SARS-CoV-2 target nucleic acids are NOT DETECTED.  The SARS-CoV-2 RNA is generally detectable in upper respiratory specimens during the acute phase of infection. The lowest concentration of SARS-CoV-2 viral copies this assay can detect is 138 copies/mL. A negative result does not preclude SARS-Cov-2 infection and should not be used as the sole basis for treatment or other patient management decisions. A negative result may occur with  improper specimen collection/handling, submission of specimen other than nasopharyngeal swab, presence of viral mutation(s) within the areas targeted by this assay, and inadequate number of viral copies(<138 copies/mL). A negative result must be combined with clinical observations, patient history, and epidemiological information. The expected result is Negative.  Fact Sheet for Patients:   EntrepreneurPulse.com.au  Fact Sheet for Healthcare Providers:  IncredibleEmployment.be  This test is no t yet approved or cleared by the [place] FDA and  has been authorized for detection and/or diagnosis of SARS-CoV-2 by FDA under an Emergency Use Authorization (EUA). This EUA will remain  in effect (meaning this test can be used) for the duration of the  COVID-19 declaration under Section 564(b)(1) of the Act, 21 U.S.C.section 360bbb-3(b)(1), unless the authorization is terminated  or revoked sooner.       Influenza A by PCR NEGATIVE NEGATIVE   Influenza B by PCR NEGATIVE NEGATIVE    Comment: (NOTE) The Xpert Xpress SARS-CoV-2/FLU/RSV plus assay is intended as an aid in the diagnosis of influenza from Nasopharyngeal swab specimens and should not be used as a sole basis for treatment. Nasal washings and aspirates are unacceptable for Xpert Xpress SARS-CoV-2/FLU/RSV testing.  Fact Sheet for Patients: EntrepreneurPulse.com.au  Fact Sheet for Healthcare Providers: IncredibleEmployment.be  This test is not yet approved or cleared by the [place] FDA and has been authorized for detection and/or diagnosis of SARS-CoV-2 by FDA under an Emergency Use Authorization (EUA). This EUA will remain in effect (meaning this test can be used) for the duration of the COVID-19 declaration under Section 564(b)(1) of the Act, 21 U.S.C. section 360bbb-3(b)(1), unless the authorization is terminated or revoked.     Resp Syncytial Virus by PCR NEGATIVE NEGATIVE    Comment: (NOTE) Fact Sheet for Patients: EntrepreneurPulse.com.au  Fact Sheet for Healthcare Providers: IncredibleEmployment.be  This test is not yet approved or cleared by the [place] FDA and has been authorized for detection and/or diagnosis of SARS-CoV-2 by FDA under an Emergency Use Authorization (EUA).  This EUA will remain in effect (meaning this test can be used) for the duration of the COVID-19 declaration under Section 564(b)(1) of the Act, 21 U.S.C. section 360bbb-3(b)(1), unless the authorization is terminated or revoked.  Performed at [facility], [place] [address] [place], [place] [address]   Urinalysis, Routine w reflex microscopic -Urine, Clean Catch     Status: Abnormal   Collection Time: 08/07/22  9:41 AM  Result Value Ref Range   Color, Urine YELLOW YELLOW   APPearance CLEAR CLEAR   Specific Gravity, Urine 1.013 1.005 - 1.030   pH 6.0 5.0 - 8.0   Glucose, UA >=500 (A) NEGATIVE mg/dL   Hgb urine dipstick NEGATIVE NEGATIVE   Bilirubin Urine NEGATIVE NEGATIVE   Ketones, ur NEGATIVE NEGATIVE mg/dL   Protein, ur >=300 (A) NEGATIVE mg/dL   Nitrite NEGATIVE NEGATIVE   Leukocytes,Ua NEGATIVE NEGATIVE   RBC / HPF 0-5 0 - 5 RBC/hpf   WBC, UA 0-5 0 - 5 WBC/hpf   Bacteria, UA RARE (A) NONE SEEN   Squamous Epithelial / HPF 0-5 0 - 5 /HPF   Mucus PRESENT     Comment: Performed at [facility], [place] [address], [place], [place] [address]   [name] CONTRAST  Result Date: 08/07/2022 CLINICAL DATA:  Transient ischemic attack (TIA). Left facial droop. Nausea and vomiting. EXAM: MRI HEAD WITHOUT CONTRAST TECHNIQUE: Multiplanar, multiecho pulse sequences of the brain and surrounding structures were obtained without intravenous contrast. COMPARISON:  Head CT 08/07/2022 and MRI 02/24/2018 FINDINGS: Brain: There is a 4 mm acute infarct in the upper left medulla. No mass, midline shift, or extra-axial fluid collection is identified. A moderate-sized chronic right parietal infarct and chronic bilateral corona radiata/basal ganglia lacunar infarcts are again noted. There are chronic blood products associated with an old right basal ganglia infarct. T2 hyperintensities elsewhere in the cerebral white matter bilaterally have progressed from the prior MRI and  are nonspecific but compatible with chronic small vessel ischemic disease. Chronic bilateral cerebellar and pontine infarcts are largely new from 2019. There is moderate cerebral atrophy. Vascular: Major intracranial vascular flow voids are preserved. Skull and  upper cervical spine: Unremarkable bone marrow signal. Sinuses/Orbits: Bilateral cataract extraction. Paranasal sinuses and mastoid air cells are clear. Other: None. IMPRESSION: 1. 4 mm acute left medullary infarct. 2. Progressive chronic small vessel ischemic disease since 2019 with multiple chronic infarcts as above. Electronically Signed   By: Logan Bores M.D.   On: 08/07/2022 12:01   CT HEAD WO CONTRAST (5MM)  Result Date: 08/07/2022 CLINICAL DATA:  Transient ischemic attack.  Altered mental status. EXAM: CT HEAD WITHOUT CONTRAST TECHNIQUE: Contiguous axial images were obtained from the base of the skull through the vertex without intravenous contrast. RADIATION DOSE REDUCTION: This exam was performed according to the departmental dose-optimization program which includes automated exposure control, adjustment of the mA and/or kV according to patient size and/or use of iterative reconstruction technique. COMPARISON:  07/30/2022 FINDINGS: Brain: There is encephalomalacia of the RIGHT posterior parietal lobe. There are remote infarcts of the RIGHT corona radiata and bilateral basal ganglia. Significant periventricular white matter changes are present. There is central and cortical atrophy. There is a remote RIGHT cerebellar infarct. There is no intra or extra-axial fluid collection or mass lesion. The basilar cisterns and ventricles have a normal appearance. There is no CT evidence for acute infarction or hemorrhage. Vascular: There is dense atherosclerotic calcification of the internal carotid arteries. No hyperdense vessels. Skull: Normal. Negative for fracture or focal lesion. Sinuses/Orbits: No acute finding. Other: None. IMPRESSION: 1. Multiple  remote infarcts. 2. Atrophy and small vessel disease. 3. No evidence for acute intracranial abnormality. Electronically Signed   By: Nolon Nations M.D.   On: 08/07/2022 10:50   DG Chest 2 View  Result Date: 08/07/2022 CLINICAL DATA:  Weakness. EXAM: CHEST - 2 VIEW COMPARISON:  09/22/2018 FINDINGS: Coarse lung markings are suggestive for chronic changes. No evidence for airspace disease or pulmonary edema. Heart and mediastinum are within normal limits. Atherosclerotic calcifications at the aortic arch. Irregularity of the posterior left seventh rib likely represents an old fracture. No pleural effusions. IMPRESSION: Chronic lung changes without acute findings. Electronically Signed   By: Markus Daft M.D.   On: 08/07/2022 10:06    Pending Labs Unresulted Labs (From admission, onward)    None       Vitals/Pain Today's Vitals   08/07/22 0930 08/07/22 1005 08/07/22 1216 08/07/22 1400  BP: (!) 187/79 (!) 181/87 (!) 210/74 (!) 190/64  Pulse: 82 73 75 77  Resp: 18 18 19 18   Temp:   98 F (36.7 C)   TempSrc:   Oral   SpO2: 97% 98% 98% 98%  PainSc:        Isolation Precautions Airborne and Contact precautions  Medications Medications  aspirin suppository 300 mg (has no administration in time range)  sodium chloride 0.9 % bolus 500 mL (0 mLs Intravenous Stopped 08/07/22 1312)    Mobility walks with device     Focused Assessments Neuro Assessment Handoff:  Swallow screen pass? Yes    NIH Stroke Scale  Dizziness Present: No Headache Present: No Interval: Initial Level of Consciousness (1a.)   : Alert, keenly responsive LOC Questions (1b. )   : Answers both questions correctly LOC Commands (1c. )   : Performs both tasks correctly Best Gaze (2. )  : Normal Visual (3. )  : No visual loss Facial Palsy (4. )    : Complete paralysis of one or both sides Motor Arm, Left (5a. )   : Drift Motor Arm, Right (5b. ) : No drift Motor Leg, Left (6a. )  :  Drift Motor Leg, Right (6b. )  : No drift Limb Ataxia (7. ): Absent Sensory (8. )  : Normal, no sensory loss Best Language (9. )  : Mild-to-moderate aphasia Dysarthria (10. ): Mild-to-moderate dysarthria, patient slurs at least some words and, at worst, can be understood with some difficulty Extinction/Inattention (11.)   : No Abnormality Complete NIHSS TOTAL: 7     Neuro Assessment: Exceptions to WDL Neuro Checks:   Initial (08/07/22 1300)  Has TPA been given? No If patient is a Neuro Trauma and patient is going to OR before floor call report to [employer] nurse: [phone] or [phone]   R Recommendations: See Admitting Provider Note  Report given to:   Additional Notes:

## 2022-08-08 ENCOUNTER — Inpatient Hospital Stay (HOSPITAL_COMMUNITY): Payer: Medicare PPO

## 2022-08-08 ENCOUNTER — Ambulatory Visit: Payer: Medicare PPO | Admitting: Rehabilitation

## 2022-08-08 DIAGNOSIS — Z8673 Personal history of transient ischemic attack (TIA), and cerebral infarction without residual deficits: Secondary | ICD-10-CM | POA: Diagnosis not present

## 2022-08-08 DIAGNOSIS — I639 Cerebral infarction, unspecified: Secondary | ICD-10-CM | POA: Diagnosis not present

## 2022-08-08 DIAGNOSIS — I63012 Cerebral infarction due to thrombosis of left vertebral artery: Secondary | ICD-10-CM | POA: Diagnosis not present

## 2022-08-08 DIAGNOSIS — R1312 Dysphagia, oropharyngeal phase: Secondary | ICD-10-CM

## 2022-08-08 LAB — GLUCOSE, CAPILLARY
Glucose-Capillary: 92 mg/dL (ref 70–99)
Glucose-Capillary: 148 mg/dL — ABNORMAL HIGH (ref 70–99)
Glucose-Capillary: 167 mg/dL — ABNORMAL HIGH (ref 70–99)
Glucose-Capillary: 124 mg/dL — ABNORMAL HIGH (ref 70–99)

## 2022-08-08 LAB — LIPID PANEL
Cholesterol: 159 mg/dL (ref 0–200)
HDL: 34 mg/dL — ABNORMAL LOW (ref >40)
LDL Cholesterol: 100 mg/dL — ABNORMAL HIGH (ref 0–99)
Total CHOL/HDL Ratio: 4.7 RATIO
Triglycerides: 127 mg/dL (ref <150)
VLDL: 25 mg/dL (ref 0–40)

## 2022-08-08 LAB — HEMOGLOBIN A1C
Hgb A1c MFr Bld: 7.8 % — ABNORMAL HIGH (ref 4.8–5.6)
Mean Plasma Glucose: 177.16 mg/dL

## 2022-08-08 MED ORDER — ASPIRIN 81 MG PO TBEC
81.0000 mg | DELAYED_RELEASE_TABLET | Freq: Every day | ORAL | Status: DC
  Administered 2022-08-08 – 2022-08-13 (×6): 81 mg via ORAL
  Filled 2022-08-08 (×6): qty 1

## 2022-08-08 MED ORDER — LABETALOL HCL 5 MG/ML IV SOLN
10.0000 mg | INTRAVENOUS | Status: DC | PRN
  Administered 2022-08-09: 10 mg via INTRAVENOUS
  Filled 2022-08-08 (×2): qty 4

## 2022-08-08 MED ORDER — IPRATROPIUM-ALBUTEROL 0.5-2.5 (3) MG/3ML IN SOLN: 3.0000 mL | RESPIRATORY_TRACT | Status: DC | PRN

## 2022-08-08 MED ORDER — FENOFIBRATE 160 MG PO TABS
160.0000 mg | ORAL_TABLET | Freq: Every day | ORAL | Status: DC
  Filled 2022-08-08 (×2): qty 1

## 2022-08-08 MED ORDER — EZETIMIBE 10 MG PO TABS
10.0000 mg | ORAL_TABLET | Freq: Every day | ORAL | Status: DC
  Filled 2022-08-08 (×2): qty 1

## 2022-08-08 MED ORDER — CLOPIDOGREL BISULFATE 75 MG PO TABS
75.0000 mg | ORAL_TABLET | Freq: Every day | ORAL | Status: DC
  Administered 2022-08-08 – 2022-08-13 (×6): 75 mg via ORAL
  Filled 2022-08-08 (×6): qty 1

## 2022-08-08 MED ORDER — SERTRALINE HCL 25 MG PO TABS
25.0000 mg | ORAL_TABLET | Freq: Every day | ORAL | Status: DC
  Filled 2022-08-08 (×3): qty 1

## 2022-08-08 MED ORDER — ENOXAPARIN SODIUM 30 MG/0.3ML IJ SOSY
30.0000 mg | PREFILLED_SYRINGE | INTRAMUSCULAR | Status: DC
  Administered 2022-08-08 – 2022-08-09 (×2): 30 mg via SUBCUTANEOUS
  Filled 2022-08-08 (×4): qty 0.3

## 2022-08-08 MED ORDER — ONDANSETRON HCL 4 MG/2ML IJ SOLN
4.0000 mg | Freq: Four times a day (QID) | INTRAMUSCULAR | Status: DC | PRN
  Filled 2022-08-08: qty 2

## 2022-08-08 MED ORDER — CEPHALEXIN 500 MG PO CAPS: 500.0000 mg | ORAL_CAPSULE | Freq: Four times a day (QID) | ORAL | Status: DC

## 2022-08-08 MED ORDER — INSULIN ASPART 100 UNIT/ML IJ SOLN
0.0000 [IU] | Freq: Three times a day (TID) | INTRAMUSCULAR | Status: DC
  Administered 2022-08-08: 3 [IU] via SUBCUTANEOUS
  Administered 2022-08-08: 1 [IU] via SUBCUTANEOUS
  Administered 2022-08-09 – 2022-08-10 (×2): 2 [IU] via SUBCUTANEOUS
  Administered 2022-08-11: 3 [IU] via SUBCUTANEOUS

## 2022-08-08 MED ORDER — GUAIFENESIN 100 MG/5ML PO LIQD
5.0000 mL | ORAL | Status: DC | PRN
  Filled 2022-08-08: qty 10

## 2022-08-08 MED ORDER — OXYCODONE HCL 5 MG PO TABS: 5.0000 mg | ORAL_TABLET | ORAL | Status: DC | PRN

## 2022-08-08 MED ORDER — SENNOSIDES-DOCUSATE SODIUM 8.6-50 MG PO TABS: 1.0000 | ORAL_TABLET | Freq: Every evening | ORAL | Status: DC | PRN

## 2022-08-08 MED ORDER — TRAZODONE HCL 50 MG PO TABS
50.0000 mg | ORAL_TABLET | Freq: Every evening | ORAL | Status: DC | PRN
  Filled 2022-08-08: qty 1

## 2022-08-08 NOTE — TOC Initial Note (Addendum)
Transition of Care [facility]) - Initial/Assessment Note    Patient Details  Name: [name] MRN: [id number] Date of Birth: [birth year]  [facility] CM/SW Contact:    Dessa Phi, RN Phone Number: 08/08/2022, 2:02 PM  Clinical Narrative: PT eval await recc.  -3:38p-Noted PT recc CIR-CIR rep to eval. Await outcome.   -4p-CIR rep to eval await outcome.               Expected Discharge Plan: Home/Self Care Barriers to Discharge: Continued Medical Work up   Patient Goals and CMS Choice Patient states their goals for this hospitalization and ongoing recovery are::  (Home)          Expected Discharge Plan and Services                                              Prior Living Arrangements/Services                       Activities of Daily Living Home Assistive Devices/Equipment: Wheelchair ADL Screening (condition at time of admission) Patient's cognitive ability adequate to safely complete daily activities?: Yes Is the patient deaf or have difficulty hearing?: No Does the patient have difficulty seeing, even when wearing glasses/contacts?: No Does the patient have difficulty concentrating, remembering, or making decisions?: No Patient able to express need for assistance with ADLs?: Yes Does the patient have difficulty dressing or bathing?: Yes Independently performs ADLs?: No Communication: Independent Dressing (OT): Needs assistance Is this a change from baseline?: Pre-admission baseline Grooming: Needs assistance Is this a change from baseline?: Pre-admission baseline Feeding: Independent Bathing: Needs assistance Is this a change from baseline?: Pre-admission baseline Toileting: Needs assistance Is this a change from baseline?: Pre-admission baseline In/Out Bed: Needs assistance Is this a change from baseline?: Pre-admission baseline Walks in Home: Needs assistance Is this a change from baseline?: Pre-admission baseline Does the  patient have difficulty walking or climbing stairs?: Yes Weakness of Legs: Both Weakness of Arms/Hands: Both  Permission Sought/Granted                  Emotional Assessment              Admission diagnosis:  Weakness [R53.1] CVA (cerebral vascular accident) ([place]) [I63.9] Cerebral infarction, acute ([place]) [I63.9] Patient Active Problem List   Diagnosis Date Noted   CVA (cerebral vascular accident) ([place]) 08/07/2022   Hypokalemia 08/07/2022   Ankle fracture 10/22/2020   Bimalleolar ankle fracture, right, closed, initial encounter 10/19/2020   Cataracts, bilateral 05/03/2020   Stage 4 chronic kidney disease ([place]) 12/21/2019   Iron deficiency anemia 02/03/2019   Pelvic fracture ([place]) 09/21/2018   Allergic reaction caused by a drug 05/06/2018   Bilateral leg edema 04/13/2018   HLD (hyperlipidemia) 58/03/9832   Embolic stroke ([place]) 82/50/5397   Left hemiparesis ([place])    Diabetes mellitus type 2 in nonobese ([place])    Acute ischemic stroke ([place])    Left arm weakness    Overweight (BMI 25.0-29.9) 02/24/2018   Diabetes mellitus without complication ([place]) 67/34/1937   HTN (hypertension) 09/02/2017   PCP:  Lauree Chandler, NP Pharmacy:   [id number] - [place], [address] [place] [address] Phone: [phone] Fax: [phone]     Social Determinants of Health (SDOH) Social History: SDOH Screenings  Food Insecurity: No Food Insecurity (08/07/2022)  Housing: Low Risk  (08/07/2022)  Transportation Needs: No Transportation Needs (08/07/2022)  Utilities: Not At Risk (08/07/2022)  Depression (PHQ2-9): Low Risk  (08/28/2021)  Financial Resource Strain: Medium Risk (02/27/2018)  Physical Activity: Inactive (02/27/2018)  Social Connections: Somewhat Isolated (02/27/2018)  Stress: No Stress Concern Present (02/27/2018)  Tobacco Use: Low Risk  (08/07/2022)   SDOH Interventions: Food Insecurity Interventions: Intervention Not  Indicated Housing Interventions: Intervention Not Indicated Transportation Interventions: Intervention Not Indicated Utilities Interventions: Intervention Not Indicated   Readmission Risk Interventions     No data to display

## 2022-08-08 NOTE — Plan of Care (Signed)
  Problem: Education: Goal: Knowledge of disease or condition will improve Outcome: Progressing Goal: Knowledge of secondary prevention will improve (MUST DOCUMENT ALL) Outcome: Progressing Goal: Knowledge of patient specific risk factors will improve [place] N/A or DELETE if not current risk factor) Outcome: Progressing   Problem: Ischemic Stroke/TIA Tissue Perfusion: Goal: Complications of ischemic stroke/TIA will be minimized Outcome: Progressing   Problem: Coping: Goal: Will verbalize positive feelings about self Outcome: Progressing   Problem: Self-Care: Goal: Ability to participate in self-care as condition permits will improve Outcome: Progressing Goal: Verbalization of feelings and concerns over difficulty with self-care will improve Outcome: Progressing Goal: Ability to communicate needs accurately will improve Outcome: Progressing   Problem: Nutrition: Goal: Dietary intake will improve Outcome: Progressing   Problem: Nutrition: Goal: Risk of aspiration will decrease Outcome: Not Progressing Note: Scheduled for modified barium swallow study   Problem: Coping: Goal: Will identify appropriate support needs Outcome: Adequate for Discharge

## 2022-08-08 NOTE — Evaluation (Addendum)
Occupational Therapy Evaluation Patient Details Name: [name] MRN: [id number] DOB: 10-02-1945 Today's Date: 08/08/2022   History of Present Illness Mrs. Defino is a 77 yr old female admitted to the hospital with slurred speech and difficulty swallowing. A MRI of the brain revealed the following: 1. 4 mm acute left medullary infarct.  2. Progressive chronic small vessel ischemic disease since 2019 with  multiple chronic infarcts as above. PMH: CVA with L sided deficits, HTN, HLD, DM II, CKD IV, arthritis, macular degeneration   Clinical Impression   The patient is currently presenting below her baseline level of functioning for self-care management. She presents with baseline L sided weakness from an old CVA from ~3 yrs ago, impaired functional mobility, impaired balance, reports of acute RUE tremors, RUE fine motor coordination deficits, and impaired ADL performance. At current, she requires increased assist for functional transfers, including sit to stand, as well as for dressing and toileting tasks. She was unable to lift and advance her lower extremities, in order to ambulate or transfer to the bedside chair today, however this may have been at least partly due to her having generalized fatigue and per her daughter, the pt not being able to keep food down for approximately a week. The pt presented with good effort and participation and she will benefit from further OT services to maximize her safety and independence with self-care tasks & to decrease the risk for restricted participation in meaningful activities.      Recommendations for follow up therapy are one component of a multi-disciplinary discharge planning process, led by the attending physician.  Recommendations may be updated based on patient status, additional functional criteria and insurance authorization.   Follow Up Recommendations  Acute inpatient rehab (3hours/day)     Assistance Recommended at Discharge Frequent or  constant Supervision/Assistance  Patient can return home with the following Help with stairs or ramp for entrance;Assist for transportation;Assistance with cooking/housework;Direct supervision/assist for medications management;A lot of help with bathing/dressing/bathroom;A lot of help with walking and/or transfers    Functional Status Assessment  Patient has had a recent decline in their functional status and demonstrates the ability to make significant improvements in function in a reasonable and predictable amount of time.  Equipment Recommendations  None recommended by OT       Precautions / Restrictions Precautions Precautions: Fall Required Braces or Orthoses: Other Brace Other Brace: R ankle stabilizing brace Restrictions Weight Bearing Restrictions: No Other Position/Activity Restrictions: Old L sided weakness from a prior CVA from ~3 years ago      Mobility Bed Mobility Overal bed mobility: Needs Assistance Bed Mobility: Supine to Sit, Sit to Supine     Supine to sit: Mod assist Sit to supine: Mod assist, +2 for physical assistance        Transfers   Equipment used: Rolling walker (2 wheels) Transfers: Sit to/from Stand Sit to Stand: Mod assist, +2 physical assistance           General transfer comment: 3 sit to stand transfer performed, pt required manual assist to place LUE on walker, and she needed cues for trunk extension and increasing base of support in standing; she was unable to fully lift and advance her BLE in order to take steps along the edge of bed      Balance       Sitting balance - Comments: Fair+ (she presented with slight occasional R sided lean, however she could correct without physical assistance when verbally cued)  Standing balance comment: static standing-poor. dynamic standing-zero         ADL either performed or assessed with clinical judgement   ADL Overall ADL's : Needs assistance/impaired Eating/Feeding: Set  up;Sitting Eating/Feeding Details (indicate cue type and reason): based on clinical judgement Grooming: Minimal assistance;Moderate assistance Grooming Details (indicate cue type and reason): simulated         Upper Body Dressing : Moderate assistance   Lower Body Dressing: Maximal assistance       Toileting- Clothing Manipulation and Hygiene: Maximal assistance;Bed level Toileting - Clothing Manipulation Details (indicate cue type and reason): based on clinical judgement             Vision Baseline Vision/History: 6 Macular Degeneration Additional Comments: She incorrectly read the time depicted on the wall clock. She reported the time to be "10 minutues til", when the actual time was 9:35. She denied having acute vision changes or deficits.            Pertinent Vitals/Pain Pain Assessment Pain Assessment: No/denies pain     Hand Dominance Right   Extremity/Trunk Assessment Upper Extremity Assessment Upper Extremity Assessment: RUE deficits/detail;LUE deficits/detail RUE Deficits / Details: AROM and strength WFL. Gross strength 4/5 to 4+/5 RUE Sensation:  (She denied having numbness and tingling of her RUE.) RUE coordination: Acute intermittent tremors noted. Finger to nose moderately impaired  LUE Deficits / Details: Required active assist for shoulder and elbow ROM, due to baseline weakness from a CVA. Elbow flexor hypertonia noted. Grip strength 3+/5, elbow flexion 2-/5, elbow extension 2-/5, shoulder flexion 2+/5 LUE Sensation:  (She denied having numbness and tingling of her LUE.)   Lower Extremity Assessment Lower Extremity Assessment: Defer to PT evaluation       Communication Communication Communication: No difficulties   Cognition Arousal/Alertness: Awake/alert Behavior During Therapy: WFL for tasks assessed/performed          General Comments: cooperative, friendly, able to follow 1-2 step commands consistently, oriented x4                 Home Living Family/patient expects to be discharged to:: Private residence Living Arrangements: Children;Spouse/significant other (daughter and spouse) Available Help at Discharge: Family Type of Home: House Home Access: Ramped entrance     Home Layout: One level     Bathroom Shower/Tub: Astronomer Accessibility: (P) Yes   Home Equipment: Conservation officer, nature (2 wheels);Associate Professor (4 wheels);Hospital bed;BSC/3in1;Wheelchair - manual;Shower seat         Prior Functioning/Environment Prior Level of Function : Needs assist       Physical Assist : (P) ADLs (physical);Mobility (physical)     Mobility Comments: Pt has been requiring assist to stand-pivot into & out of a transport chair for the past ~1 yr, due to a ankle fracture sustained last year. The pt has been receiving outpatient PT recently, including aquatic therapy & she reported being able to mostly recently ambulate ~140 feet during therapy using a platform walker.  ADLs Comments: The pt performed self-feeding with set-up assist, her family managed the household chores, and she required some assistance with bathing, dressing, and toileting from her daughter (bedside commode used for toileting). Pt's daughter stated the pt could most likely perform self-care tasks with decreased assistance, however she prefers to help the pt for added precaution.        OT Problem List: Decreased strength;Decreased range of motion;Impaired balance (sitting and/or standing);Decreased coordination;Impaired tone  OT Treatment/Interventions: Self-care/ADL training;Therapeutic exercise;Therapeutic activities;Neuromuscular education;Energy conservation;Visual/perceptual remediation/compensation;DME and/or AE instruction;Patient/family education;Balance training;Manual therapy    OT Goals(Current goals can be found in the care plan section) Acute Rehab OT Goals Patient Stated Goal: pt's daughter desires for the  pt to go to inpatient rehab, as the pt went there after her initial CVA ~3 yrs ago OT Goal Formulation: With patient/family Time For Goal Achievement: 08/22/22 Potential to Achieve Goals: Good ADL Goals Pt Will Perform Upper Body Dressing: with supervision;sitting Pt Will Perform Lower Body Dressing: with min guard assist;sit to/from stand;with adaptive equipment Pt Will Transfer to Toilet: with min guard assist;bedside commode;stand pivot transfer Pt Will Perform Toileting - Clothing Manipulation and hygiene: with min guard assist;sit to/from stand  OT Frequency: Min 2X/week    Co-evaluation PT/OT/SLP Co-Evaluation/Treatment: Yes Reason for Co-Treatment: To address functional/ADL transfers PT goals addressed during session: Mobility/safety with mobility OT goals addressed during session: Strengthening/ROM      AM-PAC OT "6 Clicks" Daily Activity     Outcome Measure Help from another person eating meals?: A Little Help from another person taking care of personal grooming?: A Little Help from another person toileting, which includes using toliet, bedpan, or urinal?: A Lot Help from another person bathing (including washing, rinsing, drying)?: A Lot Help from another person to put on and taking off regular upper body clothing?: A Lot Help from another person to put on and taking off regular lower body clothing?: A Lot 6 Click Score: 14   End of Session Equipment Utilized During Treatment: Gait belt;Rolling walker (2 wheels) Nurse Communication: Mobility status  Activity Tolerance: Patient limited by fatigue (Fair tolerance) Patient left: in bed;with call bell/phone within reach;with bed alarm set;with family/visitor present  OT Visit Diagnosis: Unsteadiness on feet (R26.81);Muscle weakness (generalized) (M62.81)                Time: 5366-4403 OT Time Calculation (min): 33 min Charges:  OT General Charges $OT Visit: 1 Visit OT Evaluation $OT Eval Moderate Complexity: 1  Mod    Jesyca Weisenburger L Rahsaan Weakland, OTR/L 08/08/2022, 11:53 AM

## 2022-08-08 NOTE — Progress Notes (Signed)
MBS completed, Full report to follow. Patient presents with moderate oropharyngeal dysphagia - with sensorimotor deficits.  She demonstrates  impaired oral control -most notably with thin and cracker boluses resulting in premature spillage of barium into pharynx.  Pt also takes large boluses - thus small single sips - using Dixie Cup may be helpful.    Pharyngeal swallow is severely DELAYED as barium pools at pyriform sinus prior to swallow initiation- with pt requiring verbal cue to swallow  counting  "1,2,3.. Swallow" .  Vertical mastication pattern with solid - at anterior oral cavity with partial cracker spilling to vallecular space poorly controlled.  She required suctioning to remove portion of solid cracker that was retained left anterior oral cavity.    Various postures including chin tuck and head turn were not helpful to protect airway.   Aspiration of thin and nectar when combined with barium tablet swallow attempt prompted reflexive cough - that was not effective to clear aspirates.  Pt will benefit from oral suction before, during and after meals.  SLP modified diet to changed diet order to dys1/nectar - and educated pt/daughter to findings, recommendations.   Kathleen Lime, MS [place] [facility] [phone]

## 2022-08-08 NOTE — Progress Notes (Signed)
PROGRESS NOTE    [name]  RSW:[id number] DOB: 10-23-1945 DOA: 08/07/2022 PCP: Lauree Chandler, NP   Brief Narrative:  77 year old with history of CVA in 2019, HTN, HLD, DM 2, CKD stage 3aI, recent UTI brought to the hospital for increasing slurred speech and some difficulty swallowing.  At baseline patient does have left-sided weakness.  About a week ago she came to the ER with similar complaints, CT of the head was negative and UA showed possible UTI therefore discharged on antibiotics.  During this admission CT of the head was negative but MRI showed acute left lateral medullary infarct.  Neurology team was consulted.   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 diet.  Neurology is recommending aspirin and Plavix for 3 weeks followed by aspirin alone.  She had stopped taking her home aspirin about a year ago - A1c-7.8, lipid panel-100 - PT/OT - Speech and swallow evaluation -Permissive hypertension  Acute urinary retention - Straight cath ordered.  If continues to retain, she may require Foley  Dysphagia - Seen by speech and swallow, moderate aspiration risk.  MBS ordered  Renal stone - Nonobstructive.  Renal function appears to be normal.  Advised oral hydration  Hyperlipidemia -Lipid panel LDL 100 - On home Zetia and Tricor  Essential hypertension -Permissive hypertension  Diabetes mellitus type 2 -Sliding scale and Accu-Cheks.  Previously patient has declined initiation of any diabetic medications per  documentation  CKD 3a -Creatinine currently at baseline of 1.6  Hypokalemia -Replete as needed  Recent urinary tract infection - Patient was prescribed Keflex but did not really take it as it caused an allergic reaction?Marland Kitchen  At this time she is not symptomatic therefore we will hold off on further antibiotics.    DVT prophylaxis: SCD's Start: 08/07/22 1530 Code Status: Full code Family Communication: Daughter at bedside  Status is: Inpatient Remains inpatient appropriate because: Ongoing evaluation for her dysphagia and CVA.  Continue hospital stay    Subjective:  Patient seen and examined at bedside.  Tells me she is having some poor oral intake.  She was able to eat her grits but unable to keep down her eggs.  Examination:  General exam: Appears calm and comfortable, generally ill and frail appearing Respiratory system: Clear to auscultation. Respiratory effort normal. Cardiovascular system: S1 & S2 heard, RRR. No JVD, murmurs, rubs, gallops or clicks. No pedal edema. Gastrointestinal system: Abdomen is nondistended, soft and nontender. No organomegaly or masses felt. Normal bowel sounds heard. Central nervous system: Alert and oriented.  Chronic left upper and lower extremity weakness Extremities: Symmetric 5 x 5 power. Skin: No rashes, lesions or ulcers Psychiatry: Judgement and insight appear normal. Mood & affect appropriate.     Objective: Vitals:   08/07/22 1532 08/07/22 1958 08/08/22 0103 08/08/22 0336  BP: (!) 156/88 (!) 161/85 (!) 204/79 (!) 205/91  Pulse: 80 82 67 76  Resp: 19 14 14 20   Temp: 97.8 F (36.6 C) 97.6 F (36.4 C) 97.6 F (36.4 C) (!) 97.4 F (36.3 C)  TempSrc: Oral Oral Oral Oral  SpO2: 100% 97% 98% 95%  Weight: 63.4 kg     Height: 5\' 2"  (1.575 m)       Intake/Output Summary (Last 24 hours) at 08/08/2022 0803 Last data filed at 08/08/2022 0600 Gross per 24 hour  Intake 978.17 ml  Output 350 ml  Net 628.17 ml   Filed Weights   08/07/22  1532  Weight: 63.4 kg     Data Reviewed:   CBC: Recent Labs  Lab 08/07/22 0822  WBC 13.7*  NEUTROABS 10.8*  HGB 15.0  HCT 46.8*  MCV 83.1  PLT 703   Basic Metabolic Panel: Recent Labs  Lab 08/07/22 0822  NA 144  K 3.4*  CL 110  CO2 21*  GLUCOSE 190*  BUN 38*  CREATININE 1.63*  CALCIUM 8.9  MG 2.2   GFR: Estimated Creatinine Clearance: 25.3 mL/min (A) (by C-G formula based on SCr of 1.63 mg/dL (H)). Liver Function Tests: Recent Labs  Lab 08/07/22 0822  AST 20  ALT 23  ALKPHOS 114  BILITOT 0.9  PROT 7.0  ALBUMIN 3.4*   No results for input(s): "LIPASE", "AMYLASE" in the last 168 hours. No results for input(s): "AMMONIA" in the last 168 hours. Coagulation Profile: No results for input(s): "INR", "PROTIME" in the last 168 hours. Cardiac Enzymes: No results for input(s): "CKTOTAL", "CKMB", "CKMBINDEX", "TROPONINI" in the last 168 hours. BNP (last 3 results) No results for input(s): "PROBNP" in the last 8760 hours. HbA1C: Recent Labs    08/08/22 0456  HGBA1C 7.8*   CBG: Recent Labs  Lab 08/07/22 1620 08/08/22 0711  GLUCAP 161* 92   Lipid Profile: Recent Labs    08/08/22 0456  CHOL 159  HDL 34*  LDLCALC 100*  TRIG 127  CHOLHDL 4.7   Thyroid Function Tests: No results for input(s): "TSH", "T4TOTAL", "FREET4", "T3FREE", "THYROIDAB" in the last 72 hours. Anemia Panel: No results for input(s): "VITAMINB12", "FOLATE", "FERRITIN", "TIBC", "IRON", "RETICCTPCT" in the last 72 hours. Sepsis Labs: No results for input(s): "PROCALCITON", "LATICACIDVEN" in the last 168 hours.  Recent Results (from the past 240 hour(s))  Resp panel by RT-PCR (RSV, Flu A&B, Covid) Anterior Nasal Swab     Status: None   Collection Time: 07/30/22  5:04 PM   Specimen: Anterior Nasal Swab  Result Value Ref Range Status   SARS Coronavirus 2 by RT PCR NEGATIVE NEGATIVE Final    Comment: (NOTE) SARS-CoV-2 target nucleic acids are NOT DETECTED.  The SARS-CoV-2 RNA is  generally detectable in upper respiratory specimens during the acute phase of infection. The lowest concentration of SARS-CoV-2 viral copies this assay can detect is 138 copies/mL. A negative result does not preclude SARS-Cov-2 infection and should not be used as the sole basis for treatment or other patient management decisions. A negative result may occur with  improper specimen collection/handling, submission of specimen other than nasopharyngeal swab, presence of viral mutation(s) within the areas targeted by this assay, and inadequate number of viral copies(<138 copies/mL). A negative result must be combined with clinical observations, patient history, and epidemiological information. The expected result is Negative.  Fact Sheet for Patients:  EntrepreneurPulse.com.au  Fact Sheet for Healthcare Providers:  IncredibleEmployment.be  This test is no t yet approved or cleared by the [place] FDA and  has been authorized for detection and/or diagnosis of SARS-CoV-2 by FDA under an Emergency Use Authorization (EUA). This EUA will remain  in effect (meaning this test can be used) for the duration of the COVID-19 declaration under Section  564(b)(1) of the Act, 21 U.S.C.section 360bbb-3(b)(1), unless the authorization is terminated  or revoked sooner.       Influenza A by PCR NEGATIVE NEGATIVE Final   Influenza B by PCR NEGATIVE NEGATIVE Final    Comment: (NOTE) The Xpert Xpress SARS-CoV-2/FLU/RSV plus assay is intended as an aid in the diagnosis of influenza from Nasopharyngeal swab specimens and should not be used as a sole basis for treatment. Nasal washings and aspirates are unacceptable for Xpert Xpress SARS-CoV-2/FLU/RSV testing.  Fact Sheet for Patients: EntrepreneurPulse.com.au  Fact Sheet for Healthcare Providers: IncredibleEmployment.be  This test is not yet approved or cleared by the [place] FDA and has been authorized for detection and/or diagnosis of SARS-CoV-2 by FDA under an Emergency Use Authorization (EUA). This EUA will remain in effect (meaning this test can be used) for the duration of the COVID-19 declaration under Section 564(b)(1) of the Act, 21 U.S.C. section 360bbb-3(b)(1), unless the authorization is terminated or revoked.     Resp Syncytial Virus by PCR NEGATIVE NEGATIVE Final    Comment: (NOTE) Fact Sheet for Patients: EntrepreneurPulse.com.au  Fact Sheet for Healthcare Providers: IncredibleEmployment.be  This test is not yet approved or cleared by the [place] FDA and has been authorized for detection and/or diagnosis of SARS-CoV-2 by FDA under an Emergency Use Authorization (EUA). This EUA will remain in effect (meaning this test can be used) for the duration of the COVID-19 declaration under Section 564(b)(1) of the Act, 21 U.S.C. section 360bbb-3(b)(1), unless the authorization is terminated or revoked.  Performed at [facility], [place] [address], [place], [place] [address]   Urine Culture     Status: Abnormal   Collection Time: 07/30/22  6:00 PM   Specimen: In/Out Cath Urine  Result Value Ref Range Status   Specimen Description IN/OUT CATH URINE  Final   Special Requests   Final    NONE Performed at [facility], [place] [address] [place], [place] [address]    Culture MULTIPLE SPECIES PRESENT, SUGGEST RECOLLECTION (A)  Final   Report Status 07/31/2022 FINAL  Final  Resp panel by RT-PCR (RSV, Flu A&B, Covid) Anterior Nasal Swab     Status: None   Collection Time: 08/07/22  9:11 AM   Specimen: Anterior Nasal Swab  Result Value Ref Range Status   SARS Coronavirus 2 by RT PCR NEGATIVE NEGATIVE Final    Comment: (NOTE) SARS-CoV-2 target nucleic acids are NOT DETECTED.  The SARS-CoV-2 RNA is generally detectable in upper respiratory specimens during the acute phase of infection. The  lowest concentration of SARS-CoV-2 viral copies this assay can detect is 138 copies/mL. A negative result does not preclude SARS-Cov-2 infection and should not be used as the sole basis for treatment or other patient management decisions. A negative result may occur with  improper specimen collection/handling, submission of specimen other than nasopharyngeal swab, presence of viral mutation(s) within the areas targeted by this assay, and inadequate number of viral copies(<138 copies/mL). A negative result must be combined with clinical observations, patient history, and epidemiological information. The expected result is Negative.  Fact Sheet for Patients:  EntrepreneurPulse.com.au  Fact Sheet for Healthcare Providers:  IncredibleEmployment.be  This test is no t yet approved or cleared by the [place] FDA and  has been authorized for detection and/or diagnosis of SARS-CoV-2 by FDA under an Emergency Use Authorization (EUA). This EUA will remain  in effect (meaning this test can be used) for the duration of the COVID-19  declaration under Section 564(b)(1) of the Act, 21 U.S.C.section 360bbb-3(b)(1), unless the authorization is terminated  or revoked sooner.       Influenza A by PCR NEGATIVE NEGATIVE Final   Influenza B by PCR NEGATIVE NEGATIVE Final    Comment: (NOTE) The Xpert Xpress SARS-CoV-2/FLU/RSV plus assay is intended as an aid in the diagnosis of influenza from Nasopharyngeal swab specimens and should not be used as a sole basis for treatment. Nasal washings and aspirates are unacceptable for Xpert Xpress SARS-CoV-2/FLU/RSV testing.  Fact Sheet for Patients: EntrepreneurPulse.com.au  Fact Sheet for Healthcare Providers: IncredibleEmployment.be  This test is not yet approved or cleared by the [place] FDA and has been authorized for detection and/or diagnosis of SARS-CoV-2 by FDA under  an Emergency Use Authorization (EUA). This EUA will remain in effect (meaning this test can be used) for the duration of the COVID-19 declaration under Section 564(b)(1) of the Act, 21 U.S.C. section 360bbb-3(b)(1), unless the authorization is terminated or revoked.     Resp Syncytial Virus by PCR NEGATIVE NEGATIVE Final    Comment: (NOTE) Fact Sheet for Patients: EntrepreneurPulse.com.au  Fact Sheet for Healthcare Providers: IncredibleEmployment.be  This test is not yet approved or cleared by the [place] FDA and has been authorized for detection and/or diagnosis of SARS-CoV-2 by FDA under an Emergency Use Authorization (EUA). This EUA will remain in effect (meaning this test can be used) for the duration of the COVID-19 declaration under Section 564(b)(1) of the Act, 21 U.S.C. section 360bbb-3(b)(1), unless the authorization is terminated or revoked.  Performed at [facility], [place] [address] [place], [place] [address]          Radiology Studies: [name] CONTRAST  Result Date: 08/07/2022 CLINICAL DATA:  Transient ischemic attack (TIA). Left facial droop. Nausea and vomiting. EXAM: MRI HEAD WITHOUT CONTRAST TECHNIQUE: Multiplanar, multiecho pulse sequences of the brain and surrounding structures were obtained without intravenous contrast. COMPARISON:  Head CT 08/07/2022 and MRI 02/24/2018 FINDINGS: Brain: There is a 4 mm acute infarct in the upper left medulla. No mass, midline shift, or extra-axial fluid collection is identified. A moderate-sized chronic right parietal infarct and chronic bilateral corona radiata/basal ganglia lacunar infarcts are again noted. There are chronic blood products associated with an old right basal ganglia infarct. T2 hyperintensities elsewhere in the cerebral white matter bilaterally have progressed from the prior MRI and are nonspecific but compatible with chronic small vessel ischemic  disease. Chronic bilateral cerebellar and pontine infarcts are largely new from 2019. There is moderate cerebral atrophy. Vascular: Major intracranial vascular flow voids are preserved. Skull and upper cervical spine: Unremarkable bone marrow signal. Sinuses/Orbits: Bilateral cataract extraction. Paranasal sinuses and mastoid air cells are clear. Other: None. IMPRESSION: 1. 4 mm acute left medullary infarct. 2. Progressive chronic small vessel ischemic disease since 2019 with multiple chronic infarcts as above. Electronically Signed   By: Logan Bores M.D.   On: 08/07/2022 12:01   CT HEAD WO CONTRAST (5MM)  Result Date: 08/07/2022 CLINICAL DATA:  Transient ischemic attack.  Altered mental status. EXAM: CT HEAD WITHOUT CONTRAST TECHNIQUE: Contiguous axial images were obtained from the base of the skull through the vertex without intravenous contrast. RADIATION DOSE REDUCTION: This exam was performed according to the departmental dose-optimization program which includes automated exposure control, adjustment of the mA and/or kV according to patient size and/or use of iterative reconstruction technique. COMPARISON:  07/30/2022 FINDINGS: Brain: There is encephalomalacia of the RIGHT posterior parietal lobe. There  are remote infarcts of the RIGHT corona radiata and bilateral basal ganglia. Significant periventricular white matter changes are present. There is central and cortical atrophy. There is a remote RIGHT cerebellar infarct. There is no intra or extra-axial fluid collection or mass lesion. The basilar cisterns and ventricles have a normal appearance. There is no CT evidence for acute infarction or hemorrhage. Vascular: There is dense atherosclerotic calcification of the internal carotid arteries. No hyperdense vessels. Skull: Normal. Negative for fracture or focal lesion. Sinuses/Orbits: No acute finding. Other: None. IMPRESSION: 1. Multiple remote infarcts. 2. Atrophy and small vessel disease. 3. No evidence  for acute intracranial abnormality. Electronically Signed   By: Nolon Nations M.D.   On: 08/07/2022 10:50   DG Chest 2 View  Result Date: 08/07/2022 CLINICAL DATA:  Weakness. EXAM: CHEST - 2 VIEW COMPARISON:  09/22/2018 FINDINGS: Coarse lung markings are suggestive for chronic changes. No evidence for airspace disease or pulmonary edema. Heart and mediastinum are within normal limits. Atherosclerotic calcifications at the aortic arch. Irregularity of the posterior left seventh rib likely represents an old fracture. No pleural effusions. IMPRESSION: Chronic lung changes without acute findings. Electronically Signed   By: Markus Daft M.D.   On: 08/07/2022 10:06        Scheduled Meds:   stroke: early stages of recovery book   Does not apply Once   aspirin  325 mg Oral Once   Continuous Infusions:  sodium chloride 75 mL/hr at 08/08/22 0623     LOS: 1 day   Time spent= 35 mins    Rhylin Venters Arsenio Loader, MD [facility]  If 7PM-7AM, please contact night-coverage  08/08/2022, 8:03 AM

## 2022-08-08 NOTE — Evaluation (Signed)
Physical Therapy Evaluation Patient Details Name: [name] MRN: [id number] DOB: 07/09/1945 Today's Date: 08/08/2022  History of Present Illness  [name] is a 77 yr old female admitted to the hospital with slurred speech and difficulty swallowing. A MRI of the brain revealed the following: 1. 4 mm acute left medullary infarct.  2. Progressive chronic small vessel ischemic disease since 2019 with  multiple chronic infarcts as above. PMH: CVA with L sided deficits, HTN, HLD, DM II, CKD IV, arthritis, macular degeneration   Clinical Impression  Pt is a 77 y.o. female with above HPI resulting in the deficits listed below (see PT Problem List). Pt typically able to perform transfers with assist from her daughter- has history of CVA ~40yrs ago. Was participating in [place] and had progressed to ambulating with platform RW. Pt required up to MOD A +2 for sit to supine and MOD A+2 for sit to stands from EOB. Regressing to MAX A with dynamic standing when attempting side steps along EOB due to difficulty with progression of L LE. Goal for attempt to transfer to the bedside chair today, however pt with increased difficulty which daughter reports is not baseline. Pt's daughter states that she is having generalized fatigue due to not being able to keep food down for approximately a week. The pt is very motivated and agreeable for participation in mobility, daughter is also great support system and appreciate and supportive of therapy services. Pt will benefit from intensive inpatient rehab upon d/c to progress toward PLOF. Will follow during hospital stay for continued PT services to progress toward increased independence and maximize safety with mobility.       Recommendations for follow up therapy are one component of a multi-disciplinary discharge planning process, led by the attending physician.  Recommendations may be updated based on patient status, additional functional criteria and insurance  authorization.  Follow Up Recommendations Acute inpatient rehab (3hours/day)      Assistance Recommended at Discharge Frequent or constant Supervision/Assistance  Patient can return home with the following  Two people to help with walking and/or transfers;Two people to help with bathing/dressing/bathroom;Help with stairs or ramp for entrance;Assist for transportation;Assistance with cooking/housework    Equipment Recommendations  (defer to next level of care)  Recommendations for Other Services       Functional Status Assessment Patient has had a recent decline in their functional status and demonstrates the ability to make significant improvements in function in a reasonable and predictable amount of time.     Precautions / Restrictions Precautions Precautions: Fall Required Braces or Orthoses: Other Brace Other Brace: R ankle stabilizing brace Restrictions Weight Bearing Restrictions: No Other Position/Activity Restrictions: Old L sided weakness from a prior CVA from ~3 years ago. History of R abkle fracture ~1-1.5 yr ago      Mobility  Bed Mobility Overal bed mobility: Needs Assistance Bed Mobility: Supine to Sit, Sit to Supine     Supine to sit: Mod assist Sit to supine: Mod assist, +2 for physical assistance        Transfers Overall transfer level: Needs assistance Equipment used: Rolling walker (2 wheels) Transfers: Sit to/from Stand Sit to Stand: Mod assist, +2 physical assistance, +2 safety/equipment, Max assist           General transfer comment: x3 sit to stand transfers performed, pt required manual assist to place LUE on walker, cues for hip extension, upright posture/forward gaze, and increasing base of support in standing; Able to take small step  with R LE, difficulty shifting weight to progress L LE laterally and regressing to MAX A when attempting to weight shift and step.Daughter reports this is not baseline. Usually able to take steps better.     Ambulation/Gait                  Stairs            Wheelchair Mobility    Modified Rankin (Stroke Patients Only)       Balance Overall balance assessment: Needs assistance Sitting-balance support: Bilateral upper extremity supported, Feet unsupported Sitting balance-Leahy Scale: Fair Sitting balance - Comments: Fair+ (she presented with slight occasional R sided lean, however she could correct without physical assistance when verbally cued)   Standing balance support: Bilateral upper extremity supported, During functional activity, Reliant on assistive device for balance Standing balance-Leahy Scale: Poor Standing balance comment: dynamic standing-zero                             Pertinent Vitals/Pain Pain Assessment Pain Assessment: No/denies pain    Home Living Family/patient expects to be discharged to:: Private residence Living Arrangements: Children;Spouse/significant other Available Help at Discharge: Family Type of Home: House Home Access: Ramped entrance       Home Layout: One level Home Equipment: Wheelchair - Automotive engineer (2 wheels);Rollator (4 wheels);Cane - quad;Cane - single point;Hospital bed (plan for getting power wheelchair. Shower seat is "terrible" per daughter) Additional Comments: OPPT 2x/week aquatics. Was able to ambulate ~>166ft with rest breaks using platform walker per pt.    Prior Function Prior Level of Function : Needs assist       Physical Assist : ADLs (physical);Mobility (physical)     Mobility Comments: can propel transport chair with her feet. Stand step transfer to transport chairs. broken ankle 1 yr ago, wears R ankle brace with all weight bearing ADLs Comments: daughter assist with ADLs. Rolling into shower with transport chair, prior to ankle break pt was able to stand and step onto shower chair- daughter reports that she tends to help more than probably  needed, especially in shower as she nervous of pt having a fall.     Hand Dominance   Dominant Hand: Right    Extremity/Trunk Assessment   Upper Extremity Assessment Upper Extremity Assessment: RUE deficits/detail RUE Deficits / Details: R UE coordination impaired- noted tremors which pt states are new and dysmetric with finger to nose - with intermittent overshooting.    Lower Extremity Assessment Lower Extremity Assessment: RLE deficits/detail;LLE deficits/detail RLE Deficits / Details: 4-/5 throughout, except hip flexion 3/5 LLE Deficits / Details: 4-/5 throughout, except hip flexion 3/5    Cervical / Trunk Assessment Cervical / Trunk Assessment: Kyphotic  Communication   Communication: No difficulties  Cognition Arousal/Alertness: Awake/alert Behavior During Therapy: WFL for tasks assessed/performed                                            General Comments      Exercises     Assessment/Plan    PT Assessment Patient needs continued PT services  PT Problem List Decreased strength;Decreased range of motion;Decreased activity tolerance;Decreased balance;Decreased mobility;Pain       PT Treatment Interventions DME instruction;Gait training;Functional mobility training;Therapeutic activities;Therapeutic exercise;Balance training;Patient/family education;Neuromuscular re-education;Wheelchair mobility training    PT Goals (Current goals  can be found in the Care Plan section)  Acute Rehab PT Goals Patient Stated Goal: Get stronger PT Goal Formulation: With patient/family Time For Goal Achievement: 08/22/22 Potential to Achieve Goals: Good    Frequency Min 4X/week     Co-evaluation PT/OT/SLP Co-Evaluation/Treatment: Yes Reason for Co-Treatment: To address functional/ADL transfers PT goals addressed during session: Mobility/safety with mobility;Proper use of DME OT goals addressed during session: Strengthening/ROM       AM-PAC PT "6 Clicks"  Mobility  Outcome Measure Help needed turning from your back to your side while in a flat bed without using bedrails?: A Lot Help needed moving from lying on your back to sitting on the side of a flat bed without using bedrails?: A Lot Help needed moving to and from a bed to a chair (including a wheelchair)?: Total Help needed standing up from a chair using your arms (e.g., wheelchair or bedside chair)?: Total Help needed to walk in hospital room?: Total Help needed climbing 3-5 steps with a railing? : Total 6 Click Score: 8    End of Session Equipment Utilized During Treatment:  (R ankle brace) Activity Tolerance: Patient tolerated treatment well Patient left: in bed;with call bell/phone within reach;with bed alarm set;with family/visitor present Nurse Communication: Mobility status PT Visit Diagnosis: Unsteadiness on feet (R26.81);Muscle weakness (generalized) (M62.81);Difficulty in walking, not elsewhere classified (R26.2);Hemiplegia and hemiparesis Hemiplegia - Right/Left: Left Hemiplegia - dominant/non-dominant: Non-dominant    Time: 7673-4193 PT Time Calculation (min) (ACUTE ONLY): 38 min   Charges:   PT Evaluation $PT Eval Low Complexity: 1 Low PT Treatments $Therapeutic Activity: 8-22 mins       Festus Barren PT, DPT  Acute Rehabilitation Services  Office [phone] 08/08/2022, 3:01 PM

## 2022-08-08 NOTE — Evaluation (Signed)
Clinical/Bedside Swallow Evaluation Patient Details  Name: [name] MRN: [id number] Date of Birth: 28-Dec-1945  Today's Date: 08/08/2022 Time: SLP Start Time (ACUTE ONLY): 19 SLP Stop Time (ACUTE ONLY): 1129 SLP Time Calculation (min) (ACUTE ONLY): 39 min  Past Medical History:  Past Medical History:  Diagnosis Date   Allergy    Arthritis    Broken ankle    Cataract    Diabetes mellitus without complication ([place])    History of colonoscopy    Hypertension    Macular degeneration syndrome    with edema---being treated.    Stage 4 chronic kidney disease ([place])    Stroke ([place]) 2019   Past Surgical History:  Past Surgical History:  Procedure Laterality Date   callous removal  Left 2017   located on 1st digit on L foot 2/2 diabetes   EYE SURGERY     HPI:  77 yo female adm to [facility] with several days of lethargy - treated for UTI, readmitted after found to have drooling, left sided weakness and dysphagia.  Medical history significant of CVA w/ left side residuals, HTN, HLD, DM2, CKD4. 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   Pt passed yale, but noted to have ongoing symptoms of dysphagia.  Prior MOCA score 29/30 when pt had CVA 2019.    Assessment / Plan / Recommendation  Clinical Impression  Patient presents with clinical indications concerning for oropharyngeal dysphagia including decreased management of oral secretions, weak voice and cough concerning for facial, hypoglossal, glossopharyngeal and vagus nerve deficits.  Pt requiring verbal cues to swallow on command and this swallow was delayed.  Sprite administered via small cup, large cup and straw bolus- immediate coughing noted with large cup bolus.  RN administered medications with liquids- with delayed cough noted and pt touching her throat, reflexive cough was not productive.    Pt  tolerates small single boluses of thin via cup-= daughter present and educated to recommendations to proceed with MBS. Daughter reports pt immediately regurgitated eggs after swallow with breakfast - mostly only eggs with a small amount of sectetions - causing SLP to be concerned for overt pharyngeal retention. SLP helped pt with use of oral suction to clear secretions.  Daughter denies pt having dysphagia, weight loss nor pneumonias following her prior CVA in 2019.  SLP educted daugher and pt that SLP role may be to mitigate aspiration and help maximize nutrition/hydration - as aspiration sometimes is not able to be fully prevented.  Advised to consider allowing pt small amounts of thin via tsp or small cup bolus and small bites of jello in pt's right oral cavity - assuring she swallowing prior to MBS completion.  Cannot rule out an esophageal deficit - but given medullary CVA- concern for oropharyneal deficits predominant.  Reviewed multiple risk factors for aspiration pna and process of MBS that SLP is planning for this afternoon.  SLP spoke to pt, daughter, Dr Reesa Chew, Dr Erlinda Hong, and RN Janett Billow about dysphagia plan - and all in agreement. Thanks for this consult of this most pleasant pt and family. SLP Visit Diagnosis: Dysphagia, oropharyngeal phase (R13.12);Dysphagia, unspecified (R13.10)    Aspiration Risk  Moderate aspiration risk    Diet Recommendation Other (Comment) (bites of jello and small sips or tsps of liquids)   Liquid Administration via: Cup;Spoon    Other  Recommendations Oral Care Recommendations: Oral care  BID    Recommendations for follow up therapy are one component of a multi-disciplinary discharge planning process, led by the attending physician.  Recommendations may be updated based on patient status, additional functional criteria and insurance authorization.  Follow up Recommendations Skilled nursing-short term rehab (<3 hours/day)      Assistance Recommended at Discharge  FULL   Functional Status Assessment Patient has had a recent decline in their functional status and demonstrates the ability to make significant improvements in function in a reasonable and predictable amount of time.  Frequency and Duration min 2x/week  2 weeks       Prognosis Prognosis for Safe Diet Advancement: Good      Swallow Study   General Date of Onset: 08/08/22 HPI: 77 yo female adm to [facility] with several days of lethargy - treated for UTI, readmitted after found to have drooling, left sided weakness and dysphagia.  Medical history significant of CVA w/ left side residuals, HTN, HLD, DM2, CKD4. 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   Pt passed yale, but noted to have ongoing symptoms of dysphagia.  Prior MOCA score 29/30 when pt had CVA 2019. Type of Study: Bedside Swallow Evaluation Previous Swallow Assessment: yale Diet Prior to this Study: Regular;Thin liquids Respiratory Status: Room air History of Recent Intubation: No Behavior/Cognition: Alert;Cooperative;Pleasant mood Oral Cavity Assessment: Within Functional Limits Oral Care Completed by SLP: No Oral Cavity - Dentition: Adequate natural dentition Vision: Functional for self-feeding Self-Feeding Abilities: Needs assist Patient Positioning: Upright in bed Baseline Vocal Quality: Low vocal intensity Volitional Cough: Weak Volitional Swallow: Able to elicit (with delay)    Oral/Motor/Sensory Function Overall Oral Motor/Sensory Function: Moderate impairment Facial ROM: Reduced left Facial Symmetry: Abnormal symmetry left Facial Strength: Reduced left Facial Sensation: Reduced left Lingual ROM: Reduced left Lingual Symmetry: Abnormal symmetry left Lingual Strength: Suspected CN XII (hypoglossal) dysfunction Velum: Other (comment);Suspected CN X (Vagus) dysfunction  (sluggish) Mandible: Within Functional Limits   Ice Chips Ice chips: Not tested   Thin Liquid Thin Liquid: Impaired Presentation: Cup;Spoon;Straw Oral Phase Impairments: Reduced lingual movement/coordination;Reduced labial seal Oral Phase Functional Implications: Oral holding Pharyngeal  Phase Impairments: Suspected delayed Swallow;Cough - Immediate;Cough - Delayed    Nectar Thick Nectar Thick Liquid: Not tested   Honey Thick Honey Thick Liquid: Not tested   Puree Puree: Not tested   Solid     Solid: Not tested      Macario Golds 08/08/2022,12:16 PM   Kathleen Lime, MS [place] Office [phone]

## 2022-08-08 NOTE — Progress Notes (Signed)
STROKE TEAM PROGRESS NOTE   SUBJECTIVE (INTERVAL HISTORY) Her daughter and speech therapist are at the bedside.  Overall her condition is stable. Pt seems less lethargic, passed beside yale on thin liquid but still has intermittent cough with liquid and dysphagia per speech therapist and plan to have MBS this afternoon. On DAPT now.   OBJECTIVE Temp:  [97.4 F (36.3 C)-98 F (36.7 C)] 97.6 F (36.4 C) (02/01 1230) Pulse Rate:  [67-82] 71 (02/01 1230) Cardiac Rhythm: Normal sinus rhythm (02/01 0700) Resp:  [14-20] 18 (02/01 1230) BP: (156-235)/(64-91) 232/88 (02/01 1237) SpO2:  [95 %-100 %] 97 % (02/01 1230) Weight:  [63.4 kg] 63.4 kg (01/31 1532)  Recent Labs  Lab 08/07/22 1620 08/08/22 0711 08/08/22 1126  GLUCAP 161* 92 148*   Recent Labs  Lab 08/07/22 0822  NA 144  K 3.4*  CL 110  CO2 21*  GLUCOSE 190*  BUN 38*  CREATININE 1.63*  CALCIUM 8.9  MG 2.2   Recent Labs  Lab 08/07/22 0822  AST 20  ALT 23  ALKPHOS 114  BILITOT 0.9  PROT 7.0  ALBUMIN 3.4*   Recent Labs  Lab 08/07/22 0822  WBC 13.7*  NEUTROABS 10.8*  HGB 15.0  HCT 46.8*  MCV 83.1  PLT 363   No results for input(s): "CKTOTAL", "CKMB", "CKMBINDEX", "TROPONINI" in the last 168 hours. No results for input(s): "LABPROT", "INR" in the last 72 hours. Recent Labs    08/07/22 0941  COLORURINE YELLOW  LABSPEC 1.013  PHURINE 6.0  GLUCOSEU >=500*  HGBUR NEGATIVE  BILIRUBINUR NEGATIVE  KETONESUR NEGATIVE  PROTEINUR >=300*  NITRITE NEGATIVE  LEUKOCYTESUR NEGATIVE       Component Value Date/Time   CHOL 159 08/08/2022 0456   TRIG 127 08/08/2022 0456   HDL 34 (L) 08/08/2022 0456   CHOLHDL 4.7 08/08/2022 0456   VLDL 25 08/08/2022 0456   LDLCALC 100 (H) 08/08/2022 0456   LDLCALC 141 (H) 02/25/2022 0938   Lab Results  Component Value Date   HGBA1C 7.8 (H) 08/08/2022      Component Value Date/Time   LABOPIA (A) 02/24/2018 1436    Result not available. Reagent lot number recalled by  manufacturer.   COCAINSCRNUR NONE DETECTED 02/24/2018 1436   LABBENZ NONE DETECTED 02/24/2018 1436   AMPHETMU NONE DETECTED 02/24/2018 1436   THCU NONE DETECTED 02/24/2018 1436   LABBARB NONE DETECTED 02/24/2018 1436    No results for input(s): "ETH" in the last 168 hours.  I have personally reviewed the radiological images below and agree with the radiology interpretations.  DG Abd 1 View  Result Date: 08/08/2022 CLINICAL DATA:  Abdominal distension. EXAM: ABDOMEN - 1 VIEW COMPARISON:  Pelvic CT 09/21/2018.  Chest radiographs 08/07/2022. FINDINGS: 1054 hours. Two supine views are submitted. The bowel gas pattern is normal. No supine evidence of free intraperitoneal air. Possible right renal calculus. There are scattered vascular calcifications. The bladder appears mildly distended. No acute osseous findings are seen. There is telemetry leads overlie the abdomen and lower chest. IMPRESSION: No evidence of bowel obstruction or other acute findings. Possible right renal calculus and bladder distension. Electronically Signed   By: Richardean Sale M.D.   On: 08/08/2022 11:12   [name] CONTRAST  Result Date: 08/07/2022 CLINICAL DATA:  Transient ischemic attack (TIA). Left facial droop. Nausea and vomiting. EXAM: MRI HEAD WITHOUT CONTRAST TECHNIQUE: Multiplanar, multiecho pulse sequences of the brain and surrounding structures were obtained without intravenous contrast. COMPARISON:  Head CT 08/07/2022 and  MRI 02/24/2018 FINDINGS: Brain: There is a 4 mm acute infarct in the upper left medulla. No mass, midline shift, or extra-axial fluid collection is identified. A moderate-sized chronic right parietal infarct and chronic bilateral corona radiata/basal ganglia lacunar infarcts are again noted. There are chronic blood products associated with an old right basal ganglia infarct. T2 hyperintensities elsewhere in the cerebral white matter bilaterally have progressed from the prior MRI and are nonspecific  but compatible with chronic small vessel ischemic disease. Chronic bilateral cerebellar and pontine infarcts are largely new from 2019. There is moderate cerebral atrophy. Vascular: Major intracranial vascular flow voids are preserved. Skull and upper cervical spine: Unremarkable bone marrow signal. Sinuses/Orbits: Bilateral cataract extraction. Paranasal sinuses and mastoid air cells are clear. Other: None. IMPRESSION: 1. 4 mm acute left medullary infarct. 2. Progressive chronic small vessel ischemic disease since 2019 with multiple chronic infarcts as above. Electronically Signed   By: Logan Bores M.D.   On: 08/07/2022 12:01   CT HEAD WO CONTRAST (5MM)  Result Date: 08/07/2022 CLINICAL DATA:  Transient ischemic attack.  Altered mental status. EXAM: CT HEAD WITHOUT CONTRAST TECHNIQUE: Contiguous axial images were obtained from the base of the skull through the vertex without intravenous contrast. RADIATION DOSE REDUCTION: This exam was performed according to the departmental dose-optimization program which includes automated exposure control, adjustment of the mA and/or kV according to patient size and/or use of iterative reconstruction technique. COMPARISON:  07/30/2022 FINDINGS: Brain: There is encephalomalacia of the RIGHT posterior parietal lobe. There are remote infarcts of the RIGHT corona radiata and bilateral basal ganglia. Significant periventricular white matter changes are present. There is central and cortical atrophy. There is a remote RIGHT cerebellar infarct. There is no intra or extra-axial fluid collection or mass lesion. The basilar cisterns and ventricles have a normal appearance. There is no CT evidence for acute infarction or hemorrhage. Vascular: There is dense atherosclerotic calcification of the internal carotid arteries. No hyperdense vessels. Skull: Normal. Negative for fracture or focal lesion. Sinuses/Orbits: No acute finding. Other: None. IMPRESSION: 1. Multiple remote infarcts. 2.  Atrophy and small vessel disease. 3. No evidence for acute intracranial abnormality. Electronically Signed   By: Nolon Nations M.D.   On: 08/07/2022 10:50   DG Chest 2 View  Result Date: 08/07/2022 CLINICAL DATA:  Weakness. EXAM: CHEST - 2 VIEW COMPARISON:  09/22/2018 FINDINGS: Coarse lung markings are suggestive for chronic changes. No evidence for airspace disease or pulmonary edema. Heart and mediastinum are within normal limits. Atherosclerotic calcifications at the aortic arch. Irregularity of the posterior left seventh rib likely represents an old fracture. No pleural effusions. IMPRESSION: Chronic lung changes without acute findings. Electronically Signed   By: Markus Daft M.D.   On: 08/07/2022 10:06   CT HEAD WO CONTRAST  Result Date: 07/30/2022 CLINICAL DATA:  Generalized weakness EXAM: CT HEAD WITHOUT CONTRAST TECHNIQUE: Contiguous axial images were obtained from the base of the skull through the vertex without intravenous contrast. RADIATION DOSE REDUCTION: This exam was performed according to the departmental dose-optimization program which includes automated exposure control, adjustment of the mA and/or kV according to patient size and/or use of iterative reconstruction technique. COMPARISON:  No prior CT head, correlation is made with MRI head 02/24/2018 FINDINGS: Brain: No evidence of acute infarction, hemorrhage, mass, mass effect, or midline shift. No hydrocephalus or extra-axial fluid collection. Encephalomalacia in the right parietal lobe, right corona radiata lacunar infarct, and bilateral basal ganglia lacunar infarcts, which appear similar to the prior MRI  when accounting for differences in technique. Additional remote infarct in the right cerebellum appears new compared to 2019. Periventricular white matter changes, likely the sequela of chronic small vessel ischemic disease. Vascular: No hyperdense vessel. Atherosclerotic calcifications in the intracranial carotid and vertebral  arteries. Skull: Negative for fracture or focal lesion. Sinuses/Orbits: Clear paranasal sinuses. Status post bilateral lens replacements. Other: The mastoid air cells are well aerated. IMPRESSION: No acute intracranial process. Electronically Signed   By: Merilyn Baba M.D.   On: 07/30/2022 16:31     PHYSICAL EXAM  Temp:  [97.4 F (36.3 C)-98 F (36.7 C)] 97.6 F (36.4 C) (02/01 1230) Pulse Rate:  [67-82] 71 (02/01 1230) Resp:  [14-20] 18 (02/01 1230) BP: (156-235)/(64-91) 232/88 (02/01 1237) SpO2:  [95 %-100 %] 97 % (02/01 1230) Weight:  [63.4 kg] 63.4 kg (01/31 1532)  General - well nourished, well developed, in no apparent distress.   Ophthalmologic - fundi not visualized due to noncooperation.     Cardiovascular - regular rhythm and rate   Neuro - awake, alert, eyes open, orientated to age, place, time and people. No aphasia, paucity of speech, following all simple commands. Able to name and repeat, but mild dysarthria. No gaze palsy, tracking bilaterally, visual field full, PERRL. Left facial droop. Tongue midline. RUE 4/5 and LUE proximal 3/5, tricep 3/5, bicep 4/5, finger grip 3/5 but significant increased flexion tone. BLE 3/5 proximal and 4/5 distal. Sensation symmetrical bilaterally, right FTN intact, gait not tested.    ASSESSMENT/PLAN Ms. [facility] is a 77 y.o. female with history of stroke in 2019 with residue left sided weakness, HTN, HLD, DM, CKD, right hip and ankle fracture and now wheelchair bound, recent UTI with functional decline presented to ED for worsening left facial droop, slurry speech, not able to keep food down, with intermittent N/V, lethargy and excessive saliva. CT no acute finding but MRI showed left lateral medullary infarct.    Stroke:  left lateral medullary infarct, likely secondary to small vessel disease source CT no acute finding but chronic right parietal infarct MRI  acute left lateral medullary infarct MRA not pursued per pt and family  wishes  Carotid Doppler not pursued per pt and family wishes  2D Echo not pursued per pt and family wishes  LDL 100 HgbA1c 7.8 lovenox for VTE prophylaxis No antithrombotic prior to admission, now on aspirin 81 mg daily and clopidogrel 75 mg daily DAPT for 3 weeks and then ASA alone.  Patient counseled to be compliant with her antithrombotic medications Ongoing aggressive stroke risk factor management Therapy recommendations:  CIR Disposition:  pending  Diabetes HgbA1c 7.8 goal < 7.0 Uncontrolled CBG monitoring SSI DM education and close PCP follow up  Hypertension Stable on the high end Gradually normalize BP in 2-3 days Long term BP goal normotensive  Hyperlipidemia Home meds:  zetia and fenofibrate  LDL 100, goal < 70 Now on zetia and fenofibrate home meds No statin given hx of statin intolerance   Hx of stroke stroke in 02/2018 with left sided weakness and numbness. MRI showed right frontal and insular cortex scattered infarcts, concerning for embolic source. MRA showed right M2, A3 severe stenosis. CUS neg, EF 60-65% and LDL 141, A1C 7.4. put on DAPT on discharge.   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 Please call with questions. Pt will follow up with stroke clinic NP at [facility] in about 4 weeks. Thanks for the consult.   Rosalin Hawking, MD PhD Stroke Neurology 08/08/2022 12:52 PM    To contact Stroke Continuity provider, please refer to [email or web address] After hours, contact General Neurology

## 2022-08-08 NOTE — Progress Notes (Signed)
   08/08/22 1105  Provider Notification  Provider Name/Title Dr. Reesa Chew  Date Provider Notified 08/08/22  Time Provider Notified 1133  Method of Notification Page  Notification Reason Red med refusal

## 2022-08-08 NOTE — Progress Notes (Signed)
   08/08/22 1230  Assess: MEWS Score  Temp 97.6 F (36.4 C)  BP (!) 235/78  MAP (mmHg) 122  Pulse Rate 71  Resp 18  Level of Consciousness Alert  SpO2 97 %  O2 Device Room Air  Assess: MEWS Score  MEWS Temp 0  MEWS Systolic 2  MEWS Pulse 0  MEWS RR 0  MEWS LOC 0  MEWS Score 2  MEWS Score Color Yellow  Assess: if the MEWS score is Yellow or Red  Were vital signs taken at a resting state? Yes  Focused Assessment Change from prior assessment (see assessment flowsheet)  Does the patient meet 2 or more of the SIRS criteria? No  MEWS guidelines implemented *See Row Information* Yes  Treat  Pain Scale 0-10  Pain Score 0  Take Vital Signs  Increase Vital Sign Frequency  Yellow: Q 2hr X 2 then Q 4hr X 2, if remains yellow, continue Q 4hrs  Escalate  MEWS: Escalate Yellow: discuss with charge nurse/RN and consider discussing with provider and RRT  Notify: Charge Nurse/RN  Name of Charge Nurse/RN Notified Russ Halo, RN  Date Charge Nurse/RN Notified 08/08/22  Time Charge Nurse/RN Notified 1256  Provider Notification  Provider Name/Title Dr. Reesa Chew  Date Provider Notified 08/08/22  Time Provider Notified 1256  Method of Notification Page  Notification Reason Change in status  Provider response See new orders  Date of Provider Response 08/08/22  Time of Provider Response 1256  Assess: SIRS CRITERIA  SIRS Temperature  0  SIRS Pulse 0  SIRS Respirations  0  SIRS WBC 0  SIRS Score Sum  0

## 2022-08-08 NOTE — Progress Notes (Addendum)
  Inpatient Rehabilitation Admissions Coordinator   I spoke with daughter by phone for rehab assessment. We discussed goals and expectations of a possible CIR admit. Patient previously at [facility] 2019. They prefer CIR for rehab. Family can provide expected caregiver support that is recommended of min assist level. I will begin insurance Auth with [facility] Medicare on 2/2 for possible CIR admit pending approval. Please call me with any questions.   Danne Baxter, RN, MSN Rehab Admissions Coordinator [phone]

## 2022-08-08 NOTE — Progress Notes (Addendum)
Modified Barium Swallow Progress Note  Patient Details  Name: [name] MRN: [id number] Date of Birth: 07-28-1945  Today's Date: 08/08/2022  Modified Barium Swallow completed.  Full report located under Chart Review in the Imaging Section.  Brief recommendations include the following:  Clinical Impression  Patient presents with moderate oropharyngeal dysphagia - with sensorimotor deficits.  She demonstrates moderately impaired oral control -most notably with thin and cracker boluses resulting in premature spillage of barium into pharynx and inconsistently oral retention.  Vertical mastication pattern with solid - at anterior oral cavity with partial cracker spilling to vallecular space poorly controlled. She required suctioning to remove portion of solid cracker that was retained left anterior oral cavity. Pharyngeal swallow with cracker bolus was not elicited until after 3rd bolus spilled into pharynx - at vallecular space - requiring 20 seconds with retained solid in pharynx before swallow was finally triggered. Pharyngeal swallow is severely delayed as barium pools at pyriform sinus with pt frequently requiring verbal cue to swallow - most notably with liquids.  SLP counted "1,2,3.. Swallow" to improve pt's swallow timing.    Audible aspiration occured with nectar when pt was swallowing tablet due to impaired coordination resulting in prolonged oral transiting.  In addition, impaired adequacy of pharyngeal contraction resulted in pharyngeal retention of nectar that spilled into airway postswallow.  Pt cleared large amount of nectar aspiration with reflexive cough but minimal amount remained.  Mild penetration of thin observed and nectar was not penetrated or aspirated when consumed without mixed consistency.   Various postures including chin tuck and head turn were not helpful.  Due to pt's severe pharyngeal initiation delay, recommend nectar liquids and tsps of thin.  Pt will benefit from  oral suction before, during and after meals.  SLP modified diet to changed diet order to dys1/nectar - and educated pt/daughter to findings, recommendations. Will follow for dysphagia management and treatment. With current level of dysphagia, adequacy of po intake for hydration and nutrition is a concern.  Family reports pt has always "swallowed slowly" causing SLP to question if pt may have undiagnosed dysphagia since her 2019 CVA.    Barium tablet appeared to stall at distal esophagus, more liquids aided clearance. Pt also appeared with dilated esophagus with barium retention that appeared mixed with secretions.  Radiologist was not present and MBS does not diagnose below the UES.  Suspect component of esophageal motility deficits.   Swallow Evaluation Recommendations       SLP Diet Recommendations: Dysphagia 1 (Puree) solids;Nectar thick liquid (thin via spoon ok)   Liquid Administration via: Spoon;Cup;Straw   Medication Administration: Whole meds with puree (crush if large)   Supervision: Full assist for feeding   Compensations: Small sips/bites;Slow rate;Other (Comment) (oral suction before, during and after po intake) Count 1,2,3 Swallow to help pt elicit swallow   Postural Changes: Seated upright at 90 degrees;Remain semi-upright after after feeds/meals (Comment)   Oral Care Recommendations: Oral care before and after PO;Other (Comment)   Other Recommendations: Have oral suction available;Order thickener from pharmacy   Kathleen Lime, [place] Office [phone]  Macario Golds 08/08/2022,8:50 PM

## 2022-08-09 DIAGNOSIS — I63012 Cerebral infarction due to thrombosis of left vertebral artery: Secondary | ICD-10-CM | POA: Diagnosis not present

## 2022-08-09 LAB — BASIC METABOLIC PANEL
Anion gap: 10 (ref 5–15)
BUN: 27 mg/dL — ABNORMAL HIGH (ref 8–23)
CO2: 19 mmol/L — ABNORMAL LOW (ref 22–32)
Calcium: 8.4 mg/dL — ABNORMAL LOW (ref 8.9–10.3)
Chloride: 115 mmol/L — ABNORMAL HIGH (ref 98–111)
Creatinine, Ser: 1.51 mg/dL — ABNORMAL HIGH (ref 0.44–1.00)
GFR, Estimated: 35 mL/min — ABNORMAL LOW (ref >60)
Glucose, Bld: 98 mg/dL (ref 70–99)
Potassium: 3.2 mmol/L — ABNORMAL LOW (ref 3.5–5.1)
Sodium: 144 mmol/L (ref 135–145)

## 2022-08-09 LAB — GLUCOSE, CAPILLARY
Glucose-Capillary: 90 mg/dL (ref 70–99)
Glucose-Capillary: 109 mg/dL — ABNORMAL HIGH (ref 70–99)
Glucose-Capillary: 158 mg/dL — ABNORMAL HIGH (ref 70–99)
Glucose-Capillary: 181 mg/dL — ABNORMAL HIGH (ref 70–99)

## 2022-08-09 LAB — MAGNESIUM: Magnesium: 2 mg/dL (ref 1.7–2.4)

## 2022-08-09 MED ORDER — SODIUM CHLORIDE 0.9 % IV SOLN: INTRAVENOUS | Status: AC

## 2022-08-09 MED ORDER — POTASSIUM CHLORIDE 20 MEQ PO PACK
40.0000 meq | PACK | Freq: Once | ORAL | Status: AC
  Administered 2022-08-09: 40 meq via ORAL
  Filled 2022-08-09: qty 2

## 2022-08-09 MED ORDER — LOSARTAN POTASSIUM 25 MG PO TABS
25.0000 mg | ORAL_TABLET | Freq: Every day | ORAL | Status: DC
  Administered 2022-08-09 – 2022-08-11 (×3): 25 mg via ORAL
  Filled 2022-08-09 (×3): qty 1

## 2022-08-09 MED ORDER — POTASSIUM CHLORIDE 10 MEQ/100ML IV SOLN
10.0000 meq | INTRAVENOUS | Status: AC
  Administered 2022-08-09 (×4): 10 meq via INTRAVENOUS
  Filled 2022-08-09 (×4): qty 100

## 2022-08-09 MED ORDER — LABETALOL HCL 5 MG/ML IV SOLN
10.0000 mg | INTRAVENOUS | Status: DC | PRN
  Administered 2022-08-09 – 2022-08-10 (×2): 10 mg via INTRAVENOUS
  Filled 2022-08-09 (×2): qty 4

## 2022-08-09 NOTE — Progress Notes (Signed)
   08/09/22 1132  Assess: MEWS Score  Temp (!) 97.4 F (36.3 C)  BP (!) 220/85  MAP (mmHg) 122  Pulse Rate 66  Resp 18  SpO2 100 %  Assess: MEWS Score  MEWS Temp 0  MEWS Systolic 2  MEWS Pulse 0  MEWS RR 0  MEWS LOC 0  MEWS Score 2  MEWS Score Color Yellow  Assess: if the MEWS score is Yellow or Red  Were vital signs taken at a resting state? Yes  Focused Assessment No change from prior assessment  Does the patient meet 2 or more of the SIRS criteria? No  MEWS guidelines implemented *See Row Information* No, previously yellow, continue vital signs every 4 hours  Notify: Charge Nurse/RN  Name of Charge Nurse/RN Notified Chancy Hurter, RN  Date Charge Nurse/RN Notified 08/09/22  Time Charge Nurse/RN Notified 1140  Assess: SIRS CRITERIA  SIRS Temperature  0  SIRS Pulse 0  SIRS Respirations  0  SIRS WBC 0  SIRS Score Sum  0

## 2022-08-09 NOTE — Progress Notes (Signed)
Physical Therapy Treatment Patient Details Name: [name] MRN: [id number] DOB: 21-Jun-1946 Today's Date: 08/09/2022   History of Present Illness [name] is a 77 yr old female admitted to the hospital with slurred speech and difficulty swallowing. A MRI of the brain revealed the following: 1. 4 mm acute left medullary infarct.  2. Progressive chronic small vessel ischemic disease since 2019 with  multiple chronic infarcts as above. PMH: CVA with L sided deficits, HTN, HLD, DM II, CKD IV, arthritis, macular degeneration    PT Comments    General Comments: AxO x 3 feeling "really sleepy" today.  Following all directions. Daughter present and very helpful.  Daughter reported, pt was amb with assist with a platform walker and participating in Aquatic Therapy. Assisted pt OOB to stand required + 2 assist.  General bed mobility comments: pt required Max Asisst to transition from supine to EOB.  Rigid throughout. No functional use L UE..  Once upright, pt able to static sit EOB at Supervision> 5 min with heavy use Right rail for support.   General transfer comment: Pt performed sit to stand 5 times at [facility] Assist with increased fatigued each time.  Pt was able to static stand but a limited time and present with Mod RIGHT lean.  Pt present with L LE rigidity which helped support her weight and RIGHT knee buckle due to muscle weakness.  Attempted marching, however unable to functionally lift L LE and unable to support herself upright safe enough to complete.  Pt required Total Asisst back to bed then positioned to comfort.  Pt would benefit from aggressive Rehab such as CIR to regain her prior level of mobility.    Recommendations for follow up therapy are one component of a multi-disciplinary discharge planning process, led by the attending physician.  Recommendations may be updated based on patient status, additional functional criteria and insurance authorization.  Follow Up Recommendations   Acute inpatient rehab (3hours/day)     Assistance Recommended at Discharge Frequent or constant Supervision/Assistance  Patient can return home with the following Two people to help with walking and/or transfers;Two people to help with bathing/dressing/bathroom;Help with stairs or ramp for entrance;Assist for transportation;Assistance with cooking/housework   Equipment Recommendations  None recommended by PT    Recommendations for Other Services       Precautions / Restrictions Precautions Precautions: Fall Precaution Comments: L HEMI CVA 3 years ago Required Braces or Orthoses: Other Brace Other Brace: R ankle stabilizing brace Restrictions Weight Bearing Restrictions: No RLE Weight Bearing: Weight bearing as tolerated LLE Weight Bearing: Weight bearing as tolerated Other Position/Activity Restrictions: Old L sided weakness from a prior CVA from ~3 years ago. History of R abkle fracture ~1-1.5 yr ago     Mobility  Bed Mobility Overal bed mobility: Needs Assistance Bed Mobility: Supine to Sit, Sit to Supine     Supine to sit: Max assist Sit to supine: Max assist, Total assist   General bed mobility comments: pt required Max Asisst to transition from supine to EOB.  Rigid throughout. No functional use L UE..  Once upright, pt able to static sit EOB at Supervision> 5 min with heavy use Right rail for support.  Pt required Total Asisst back to bed then positioned to comfort.    Transfers Overall transfer level: Needs assistance Equipment used: Rolling walker (2 wheels), Left platform walker Transfers: Sit to/from Stand Sit to Stand: Mod assist, Max assist, +2 physical assistance, +2 safety/equipment  General transfer comment: Pt performed sit to stand 5 times at [facility] Assist with increased fatigued each time.  Pt was able to static stand but a limited time and present with Mod RIGHT lean.  Pt present with L LE rigidity which helped support her weight and RIGHT  knee buckle due to muscle weakness.  Attempted marching, however unable to functionally lift L LE and unable to support herself upright safe enough to complete.    Ambulation/Gait                   Stairs             Wheelchair Mobility    Modified Rankin (Stroke Patients Only)       Balance                                            Cognition Arousal/Alertness: Awake/alert Behavior During Therapy: WFL for tasks assessed/performed Overall Cognitive Status: Within Functional Limits for tasks assessed                                 General Comments: AxO x 3 feeling "really sleepy" today.  Following all directions.        Exercises      General Comments        Pertinent Vitals/Pain Pain Assessment Pain Assessment: No/denies pain    Home Living                          Prior Function            PT Goals (current goals can now be found in the care plan section) Progress towards PT goals: Progressing toward goals    Frequency    Min 4X/week      PT Plan Current plan remains appropriate    Co-evaluation              AM-PAC PT "6 Clicks" Mobility   Outcome Measure  Help needed turning from your back to your side while in a flat bed without using bedrails?: A Lot Help needed moving from lying on your back to sitting on the side of a flat bed without using bedrails?: A Lot Help needed moving to and from a bed to a chair (including a wheelchair)?: A Lot Help needed standing up from a chair using your arms (e.g., wheelchair or bedside chair)?: Total Help needed to walk in hospital room?: Total Help needed climbing 3-5 steps with a railing? : Total 6 Click Score: 9    End of Session Equipment Utilized During Treatment: Gait belt Activity Tolerance: Patient tolerated treatment well Patient left: in bed;with call bell/phone within reach;with bed alarm set;with family/visitor present Nurse  Communication: Mobility status PT Visit Diagnosis: Unsteadiness on feet (R26.81);Muscle weakness (generalized) (M62.81);Difficulty in walking, not elsewhere classified (R26.2);Hemiplegia and hemiparesis Hemiplegia - Right/Left: Left Hemiplegia - dominant/non-dominant: Non-dominant     Time: 1352-1420 PT Time Calculation (min) (ACUTE ONLY): 28 min  Charges:  $Therapeutic Activity: 23-37 mins                     Rica Koyanagi  PTA Acute  Rehabilitation Services Office M-F          [phone] Weekend pager [phone]

## 2022-08-09 NOTE — PMR Pre-admission (Signed)
PMR Admission Coordinator Pre-Admission Assessment  Patient: [name] is an 77 y.o., female MRN: [id number] DOB: 1946/02/13 Height: 5\' 2"  (157.5 cm) Weight: 63.4 kg  Insurance Information HMO:     PPO:      PCP:      IPA:      80/20:     OTHER:  PRIMARY: [employer]      Policy#: [id number]      Subscriber: pt CM Name: Myriam Jacobson      Phone#: [phone] ext [phone]     Fax#: [phone] Pre-Cert#: [id number] approved for *** days f/u with ***     Employer:  Benefits:  Phone #: [phone]     Name: 2/1 Eff. Date: 07/08/22     Deduct: none      Out of Pocket Max: $4000 CIR: $160 co pay per day days 1 until 10      SNF: no co pay per day days 1 util 20; $50 co pay per day days 21 until 100 Outpatient: $20 per visit     Co-Pay: visits per medical neccesity Home Health: 100%      Co-Pay: visits per medical neccesity DME: 80%     Co-Pay: 20% Providers: in network  SECONDARY: none      Policy#:      Phone#:   Development worker, community:       Phone#:   The Engineer, petroleum" for patients in Inpatient Rehabilitation Facilities with attached "Privacy Act [place] Records" was provided and verbally reviewed with: Family  Emergency Contact Information Contact Information     Name Relation Home Work [place] Daughter [phone]     Solt,[name] Other   [phone]      Current Medical History  Patient Admitting Diagnosis: CVA  History of Present Illness: 77 year old female with history of CVA with left sided residuals, HTN, DM2, CKD 4 and right hip and ankle fracture. Presented on 08/07/22 to [facility] with slurred speech and difficulty swallowing. She was at baseline until 5 days prior to admit. Had become nausea and few episodes of vomiting, Recently treated for UTI. Had a possible allergic reaction to the antibiotic.   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 Neurology  consulted. CT no acute finding but chronic right parietal infarct. MRA, 2 d echo and carotid dopplers not pursued per pt and family wishes. Lovenox for DVT prophylaxis. No antithrombotic pta , now on Asa and Clopidogrel for 3 weeks and the ASA alone. Hgb A1c 7.8. SSI and CBG monitors, DM education and follow up with PCP. To allow for permissive HTN. To begin Losartan. LDL 100 on Zetia and fenofibrate as at home for statin intolerant. MBS with SLP with severe pharyngeal initiation delay and nectar liquids and tsps of thins recommended.  Oral suction before and after meals. Dys 1 nectar diet. Whole meds with puree and crush if pills large.   Complete NIHSS TOTAL: 6  Patient's medical record from [facility] has been reviewed by the rehabilitation admission coordinator and physician.  Past Medical History  Past Medical History:  Diagnosis Date   Allergy    Arthritis    Broken ankle    Cataract    Diabetes mellitus without complication ([place])    History of colonoscopy    Hypertension    Macular degeneration syndrome    with edema---being treated.    Stage 4 chronic kidney disease ([place])  Stroke [facility]) 2019   Has the patient had major surgery during 100 days prior to admission? No  Family History   family history includes COPD in her brother and father; Congestive Heart Failure in her father; Diabetes in her maternal aunt; Kidney disease in her mother.  Current Medications  Current Facility-Administered Medications:    0.9 %  sodium chloride infusion, , Intravenous, Continuous, Amin, Ankit Chirag, MD, Last Rate: 50 mL/hr at 08/09/22 1504, Infusion Verify at 08/09/22 1504   acetaminophen (TYLENOL) tablet 650 mg, 650 mg, Oral, Q4H PRN **OR** acetaminophen (TYLENOL) 160 MG/5ML solution 650 mg, 650 mg, Per Tube, Q4H PRN **OR** acetaminophen (TYLENOL) suppository 650 mg, 650 mg, Rectal, Q4H PRN, Marylyn Ishihara, Tyrone A, DO   aspirin EC tablet 81 mg, 81 mg, Oral, Daily, Amin, Ankit Chirag, MD, 81  mg at 08/09/22 7672   clopidogrel (PLAVIX) tablet 75 mg, 75 mg, Oral, Daily, Amin, Ankit Chirag, MD, 75 mg at 08/09/22 0850   enoxaparin (LOVENOX) injection 30 mg, 30 mg, Subcutaneous, Q24H, Rosalin Hawking, MD, 30 mg at 08/09/22 1336   ezetimibe (ZETIA) tablet 10 mg, 10 mg, Oral, Daily, Amin, Ankit Chirag, MD   fenofibrate tablet 160 mg, 160 mg, Oral, Daily, Amin, Ankit Chirag, MD   guaiFENesin (ROBITUSSIN) 100 MG/5ML liquid 5 mL, 5 mL, Oral, Q4H PRN, Amin, Ankit Chirag, MD   insulin aspart (novoLOG) injection 0-9 Units, 0-9 Units, Subcutaneous, TID WC, Amin, Ankit Chirag, MD, 2 Units at 08/09/22 1652   ipratropium-albuterol (DUONEB) 0.5-2.5 (3) MG/3ML nebulizer solution 3 mL, 3 mL, Nebulization, Q4H PRN, Amin, Ankit Chirag, MD   labetalol (NORMODYNE) injection 10 mg, 10 mg, Intravenous, Q4H PRN, Amin, Ankit Chirag, MD, 10 mg at 08/09/22 1158   losartan (COZAAR) tablet 25 mg, 25 mg, Oral, Daily, Amin, Ankit Chirag, MD, 25 mg at 08/09/22 0851   ondansetron (ZOFRAN) injection 4 mg, 4 mg, Intravenous, Q6H PRN, Amin, Ankit Chirag, MD   oxyCODONE (Oxy IR/ROXICODONE) immediate release tablet 5 mg, 5 mg, Oral, Q4H PRN, Amin, Ankit Chirag, MD   senna-docusate (Senokot-S) tablet 1 tablet, 1 tablet, Oral, QHS PRN, Amin, Ankit Chirag, MD   sertraline (ZOLOFT) tablet 25 mg, 25 mg, Oral, Daily, Amin, Ankit Chirag, MD   traZODone (DESYREL) tablet 50 mg, 50 mg, Oral, QHS PRN, Amin, Ankit Chirag, MD  Patients Current Diet:  Diet Order             DIET - DYS 1 Room service appropriate? Yes; Fluid consistency: Nectar Thick  Diet effective now                  Precautions / Restrictions Precautions Precautions: Fall Precaution Comments: L HEMI CVA 3 years ago Other Brace: R ankle stabilizing brace Restrictions Weight Bearing Restrictions: No RLE Weight Bearing: Weight bearing as tolerated LLE Weight Bearing: Weight bearing as tolerated Other Position/Activity Restrictions: Old L sided weakness from a  prior CVA from ~3 years ago. History of R abkle fracture ~1-1.5 yr ago   Has the patient had 2 or more falls or a fall with injury in the past year? No  Prior Activity Level Limited Community (1-2x/wk): Has been wheelchair transfers and few pivotal steps for 1 1//2 years until OP therapy recently  Prior Functional Level Self Care: Did the patient need help bathing, dressing, using the toilet or eating? Needed some help  Indoor Mobility: Did the patient need assistance with walking from room to room (with or without device)? Needed some help  Stairs: Did the  patient need assistance with internal or external stairs (with or without device)? Needed some help  Functional Cognition: Did the patient need help planning regular tasks such as shopping or remembering to take medications? Needed some help  Patient Information Are you of Hispanic, Latino/a,or Spanish origin?: A. No, not of Hispanic, Latino/a, or Spanish origin, X. Patient unable to respond (not hispanic) What is your race?: A. White, X. Patient unable to respond Do you need or want an interpreter to communicate with a doctor or health care staff?: 9. Unable to respond (no)  Patient's Response To:  Health Literacy and Transportation Is the patient able to respond to health literacy and transportation needs?: No Health Literacy - How often do you need to have someone help you when you read instructions, pamphlets, or other written material from your doctor or pharmacy?: Patient unable to respond In the past 12 months, has lack of transportation kept you from medical appointments or from getting medications?: No In the past 12 months, has lack of transportation kept you from meetings, work, or from getting things needed for daily living?: No  Development worker, international aid / [place] Devices/Equipment: Wheelchair Home Equipment: Wheelchair - manual, Transport chair, BSC/3in1, Shower seat, Conservation officer, nature (2 wheels), Rollator (4  wheels), [place] - quad, [place] - single point, Hospital bed (plan for getting power wheelchair. Shower seat is "terrible" per daughter)  Prior Device Use: Indicate devices/aids used by the patient prior to current illness, exacerbation or injury? Manual wheelchair and Walker  Current Functional Level Cognition  Overall Cognitive Status: Within Functional Limits for tasks assessed Orientation Level: Oriented X4 General Comments: AxO x 3 feeling "really sleepy" today.  Following all directions.    Extremity Assessment (includes Sensation/Coordination)  Upper Extremity Assessment: RUE deficits/detail RUE Deficits / Details: R UE coordination impaired- noted tremors which pt states are new and dysmetric with finger to nose - with intermittent overshooting. RUE Sensation:  (She denied having numbness and tingling of her RUE.) LUE Deficits / Details: Required active assist for shoulder and elbow ROM, due to baseline weakness from a CVA. Elbow flexor hypertonia noted. Grip strength 3+/5, elbow flexion 2-/5, elbow extension 2-/5, shoulder flexion 2+/5 LUE Sensation:  (She denied having numbness and tingling of her LUE.)  Lower Extremity Assessment: RLE deficits/detail, LLE deficits/detail RLE Deficits / Details: 4-/5 throughout, except hip flexion 3/5 LLE Deficits / Details: 4-/5 throughout, except hip flexion 3/5    ADLs  Overall ADL's : Needs assistance/impaired Eating/Feeding: Set up, Sitting Eating/Feeding Details (indicate cue type and reason): based on clinical judgement Grooming: Minimal assistance, Moderate assistance Grooming Details (indicate cue type and reason): simulated Upper Body Dressing : Moderate assistance Lower Body Dressing: Maximal assistance Toileting- Clothing Manipulation and Hygiene: Maximal assistance, Bed level Toileting - Clothing Manipulation Details (indicate cue type and reason): based on clinical judgement    Mobility  Overal bed mobility: Needs Assistance Bed  Mobility: Supine to Sit, Sit to Supine Supine to sit: Max assist Sit to supine: Max assist, Total assist General bed mobility comments: pt required Max Asisst to transition from supine to EOB.  Rigid throughout. No functional use L UE..  Once upright, pt able to static sit EOB at Supervision> 5 min with heavy use Right rail for support.  Pt required Total Asisst back to bed then positioned to comfort.    Transfers  Overall transfer level: Needs assistance Equipment used: Rolling walker (2 wheels), Left platform walker Transfers: Sit to/from Stand Sit to Stand: Mod  assist, Max assist, +2 physical assistance, +2 safety/equipment General transfer comment: Pt performed sit to stand 5 times at [facility] Assist with increased fatigued each time.  Pt was able to static stand but a limited time and present with Mod RIGHT lean.  Pt present with L LE rigidity which helped support her weight and RIGHT knee buckle due to muscle weakness.  Attempted marching, however unable to functionally lift L LE and unable to support herself upright safe enough to complete.    Ambulation / Gait / Stairs / Office manager / Balance Dynamic Sitting Balance Sitting balance - Comments: Fair+ (she presented with slight occasional R sided lean, however she could correct without physical assistance when verbally cued) Balance Overall balance assessment: Needs assistance Sitting-balance support: Bilateral upper extremity supported, Feet unsupported Sitting balance-Leahy Scale: Fair Sitting balance - Comments: Fair+ (she presented with slight occasional R sided lean, however she could correct without physical assistance when verbally cued) Standing balance support: Bilateral upper extremity supported, During functional activity, Reliant on assistive device for balance Standing balance-Leahy Scale: Poor Standing balance comment: dynamic standing-zero    Special needs/care consideration Hgb A1c 7.8   Previous  Home Environment  Living Arrangements: Children (daughter)  Lives With: Daughter Available Help at Discharge: Family, Available 24 hours/day Type of Home: House Home Layout: One level Home Access: Ramped entrance Bathroom Shower/Tub: Multimedia programmer: Associate Professor Accessibility: Yes Home Care Services: Other (Comment) Additional Comments: OPPT 2x/week aquatics. Was able to ambulate ~>125ft with rest breaks using platform walker per pt. At home for 1 to 1/2 years was min assist transfers to wheelchair and a few pivotal steps. Just had walked with OP therapy with PFRW in past month, not at home. Once per week aquatics recently.  Discharge Living Setting Plans for Discharge Living Setting: Patient's home, Lives with (comment) (daughter) Type of Home at Discharge: House Discharge Home Layout: One level Discharge Home Access: [place] entrance Discharge Bathroom Shower/Tub: Walk-in shower Discharge Bathroom Toilet: Standard Discharge Bathroom Accessibility: Yes How Accessible: Accessible via walker Does the patient have any problems obtaining your medications?: No  Social/Family/Support Systems Patient Roles: Parent Contact Information: daughter, [name] Anticipated Caregiver: daughter Anticipated Caregiver's Contact Information: see contacts Ability/Limitations of Caregiver: none Caregiver Availability: 24/7 Discharge Plan Discussed with Primary Caregiver: Yes Is Caregiver In Agreement with Plan?: Yes Does Caregiver/Family have Issues with Lodging/Transportation while Pt is in Rehab?: No  Goals Patient/Family Goal for Rehab: min asisst transfers and few pivotal steps with min assist PT, min OT, supervision SLP Expected length of stay: ELOS 14 to 20 days Pt/Family Agrees to Admission and willing to participate: Yes Program Orientation Provided & Reviewed with Pt/Caregiver Including Roles  & Responsibilities: Yes  Decrease burden of Care through IP rehab admission:  n/a  Possible need for SNF placement upon discharge: not anticipated, Daughter has repeatedly refused in the past and opted to take home.  Patient Condition: I have reviewed medical records from [facility], spoken with  daughter. I discussed via phone for inpatient rehabilitation assessment.  Patient will benefit from ongoing PT, OT, and SLP, can actively participate in 3 hours of therapy a day 5 days of the week, and can make measurable gains during the admission.  Patient will also benefit from the coordinated team approach during an Inpatient Acute Rehabilitation admission.  The patient will receive intensive therapy as well as Rehabilitation physician, nursing, social worker, and care management interventions.  Due to bladder management,  bowel management, safety, skin/wound care, disease management, medication administration, pain management, and patient education the patient requires 24 hour a day rehabilitation nursing.  The patient is currently *** with mobility and basic ADLs.  Discharge setting and therapy post discharge at home with home health is anticipated.  Patient has agreed to participate in the Acute Inpatient Rehabilitation Program and will admit {Time; today/tomorrow:10263}.  Preadmission Screen Completed By: Danne Baxter RN MSN with updates by Julious Payer, Audelia Acton, 08/09/2022 4:54 PM ______________________________________________________________________   Discussed status with Dr. Marland Kitchen on *** at *** and received approval for admission today.  Admission Coordinator: Danne Baxter RN MSN with updates by Julious Payer, Audelia Acton, RN, time Marland KitchenSudie Grumbling ***   Assessment/Plan: Diagnosis: Does the need for close, 24 hr/day Medical supervision in concert with the patient's rehab needs make it unreasonable for this patient to be served in a less intensive setting? {yes_no_potentially:3041433} Co-Morbidities requiring supervision/potential complications: *** Due to {due QM:2500370}, does  the patient require 24 hr/day rehab nursing? {yes_no_potentially:3041433} Does the patient require coordinated care of a physician, rehab nurse, PT, OT, and SLP to address physical and functional deficits in the context of the above medical diagnosis(es)? {yes_no_potentially:3041433} Addressing deficits in the following areas: {deficits:3041436} Can the patient actively participate in an intensive therapy program of at least 3 hrs of therapy 5 days a week? {yes_no_potentially:3041433} The potential for patient to make measurable gains while on inpatient rehab is {potential:3041437} Anticipated functional outcomes upon discharge from inpatient rehab: {functional outcomes:304600100} PT, {functional outcomes:304600100} OT, {functional outcomes:304600100} SLP Estimated rehab length of stay to reach the above functional goals is: *** Anticipated discharge destination: {anticipated dc setting:21604} 10. Overall Rehab/Functional Prognosis: {potential:3041437}   MD Signature: ***

## 2022-08-09 NOTE — Progress Notes (Signed)
PATIENT NAME: [name] DOB: 08-14-1945 MRN: [id number]  PRIMARY CARE PROVIDER: Lauree Chandler, NP  RESPONSIBLE PARTY:  Acct ID - Guarantor Home Phone Work Phone Relationship Acct Type  [id number] - Sunderlin,BILL* [phone]  Self P/F     [place], [place], [place] [address]     I connected with  Rande Brunt on 08/06/22 by telephone and verified that I am speaking with the correct person using two identifiers.   I discussed the limitations of evaluation and management by telemedicine. The patient expressed understanding and agreed to proceed.   Palliative Care Initial Encounter Note   Completed telephone visit.     HISTORY OF PRESENT ILLNESS: 77 y.o. female with medical history significant of CVA w/ left side residuals, HTN, HLD, DM2, CKD4.    Spoke to daughter [name] who report she took pt to ED last week d/t being unable to eat or drink and was told pt has a UTI. While at the hospital the pt was given ABT. Upon going home the pt has not been able to keep any medications down until Sunday, January, 28th. Pt is eating and drinking very little but is currently able to keep ABT meds down. [name] reports pt is keeping popsicles down; encouraged her to offer food and drink that pt likes. Daughter states that the vital signs are stable at this time (O2 -98% on RA; BP- 150/80; FSBS -156).  For now [name] will try to offer food and drink and will continue to give ABT from previous ED visit.   No c/o pain or other issues at this time.    CODE STATUS: Full Code ADVANCED DIRECTIVES: N MOST FORM: No PPS:   Next F/U Appt Scheduled For: 08/23/22 @ 11am    CODE STATUS:   Code Status: Full Code  ADVANCED DIRECTIVES: N MOST FORM: N PPS:        Kandis Mannan, LPN

## 2022-08-09 NOTE — Progress Notes (Signed)
Inpatient Rehabilitation Admissions Coordinator   I spoke with daughter by phone to review estimated cost of care if approved by [facility] for CIR admit. She is in agreement. We will follow up once payor has made a determination.  Danne Baxter, RN, MSN Rehab Admissions Coordinator [phone] 08/09/2022 1:31 PM

## 2022-08-09 NOTE — TOC Progression Note (Signed)
Transition of Care [facility]) - Progression Note    Patient Details  Name: [name] MRN: [id number] Date of Birth: September 29, 1945  Transition of Care [facility]) CM/SW Contact  Modelle Vollmer, Juliann Pulse, RN Phone Number: 08/09/2022, 11:51 AM  Clinical Narrative: Noted per CIR rep Demetra Shiner for CIR-await outcome.      Expected Discharge Plan: IP Rehab Facility Barriers to Discharge: Insurance Authorization  Expected Discharge Plan and Services                                               Social Determinants of Health (SDOH) Interventions SDOH Screenings   Food Insecurity: No Food Insecurity (08/07/2022)  Housing: Low Risk  (08/07/2022)  Transportation Needs: No Transportation Needs (08/07/2022)  Utilities: Not At Risk (08/07/2022)  Depression (PHQ2-9): Low Risk  (08/28/2021)  Financial Resource Strain: Medium Risk (02/27/2018)  Physical Activity: Inactive (02/27/2018)  Social Connections: Somewhat Isolated (02/27/2018)  Stress: No Stress Concern Present (02/27/2018)  Tobacco Use: Low Risk  (08/07/2022)    Readmission Risk Interventions     No data to display

## 2022-08-09 NOTE — Progress Notes (Signed)
Speech Language Pathology Treatment: Dysphagia  Patient Details Name: [name] MRN: [id number] DOB: [birth year] Today's Date: 08/09/2022 Time: 4097-3532 SLP Time Calculation (min) (ACUTE ONLY): 35 min  Assessment / Plan / Recommendation Clinical Impression  Pt seen for dysphagia education and review effective compensation strategies to mitigate aspiration risk.  Today daughter providing pt with biscuits and gravy, thin coffee via tsp and nectar thick liquids.  Pt today more alert, with improved speech articulation and phonation strength.  Observed pt being fed pills with ice cream and nectar thick liquids.  Pt is independently counting 9,9,2 to help elicit swallow - which is very effective for her.  Delayed overt coughing x2 noted - which daughter reports did not occur until SlP arrived to room.  Pt's cough is stronger than yesterday but remains weaker than baseline.  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  Reviewed pt's MBS with pt and her daughter - demonstrating utility of compensation strategies.   Pt using oral suction to clear secretions.  Given pt marginal improvement in swallowing today - hopeful pt will be able to meet nutritional needs as swallow improves.     Given delayed coughing observed and concern for potential esophageal component to dysphagia -? Dysmotility? Recommend strict aspiration precautions to mitigate risk - including small frequent meals.   SLP will follow up for dysphagia management - and RMST at [facility] with aggressive dysphagia/dysarthria treatment is advised.        HPI HPI: 77 yo female adm to [facility] with several days of lethargy - treated for UTI, readmitted after found to have drooling, left sided weakness and dysphagia.  Medical history significant of CVA w/ left side residuals, HTN, HLD, DM2, CKD4. 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   Pt passed [facility], but noted to have ongoing symptoms of dysphagia.  Prior MOCA score 29/30 when pt had CVA 2019.  Follow up for dysphagia treatment indicated.      SLP Plan      Follow up at [facility]   Recommendations for follow up therapy are one component of a multi-disciplinary discharge planning process, led by the attending physician.  Recommendations may be updated based on patient status, additional functional criteria and insurance authorization.    Recommendations  Diet recommendations: (P) Dysphagia 1 (puree);Nectar-thick liquid Liquids provided via: (P) Teaspoon;Cup;Straw (thin via tsp) Medication Administration: (P) Other (Comment) (crush if large) Supervision: (P) Full supervision/cueing for compensatory strategies;Trained caregiver to feed patient Compensations: (P) Small sips/bites;Slow rate;Other (Comment) Postural Changes and/or Swallow Maneuvers: (P) Seated upright 90 degrees;Upright 30-60 min after meal                           Kathleen Lime, MS [facility] SLP Acute Rehab Services Office [phone]  Macario Golds  08/09/2022, 10:02 AM

## 2022-08-09 NOTE — Progress Notes (Signed)
PROGRESS NOTE    [name]  OZD:[id number] DOB: Dec 03, 1945 DOA: 08/07/2022 PCP: Lauree Chandler, NP   Brief Narrative:  77 year old with history of CVA in 2019, HTN, HLD, DM 2, CKD stage 3aI, recent UTI brought to the hospital for increasing slurred speech and some difficulty swallowing.  At baseline patient does have left-sided weakness.  About a week ago she came to the ER with similar complaints, CT of the head was negative and UA showed possible UTI therefore discharged on antibiotics.  During this admission CT of the head was negative but MRI showed acute left lateral medullary infarct.  Neurology team was consulted.  It was decided for patient to be on aspirin and Plavix for 3 weeks followed by aspirin alone.  PT/OT recommended CIR therefore inpatient rehab team was consulted.  Due to dysphagia she was seen by speech and swallow therapy and deemed to be a moderate risk therefore their service will continue working with the patient.   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  PT/OT recommended CIR, inpatient rehab team consulted. - A1c-7.8, lipid panel-100 - Speech and swallow elevation performed and she is deemed to be moderate risk for aspiration.  Speech therapist will continue working with the patient.  Acute urinary retention - She did get straight cath since then has not been an issue for now.   Renal stone - Nonobstructive.  Renal function appears to be normal.  Advised oral hydration  Hyperlipidemia -Lipid panel LDL 100 - On home Zetia and  Tricor  Essential hypertension, uncontrolled -Permissive hypertension.  Will start losartan 25 mg daily  Diabetes mellitus type 2 -Sliding scale and Accu-Cheks.  Previously patient has declined initiation of any diabetic medications per documentation  CKD 3a -Creatinine currently at baseline of 1.6  Hypokalemia -Replete as needed  Recent urinary tract infection - Patient was prescribed Keflex but did not really take it as it caused an allergic reaction?Marland Kitchen  At this time she is not symptomatic therefore we will hold off on further antibiotics.    DVT prophylaxis: enoxaparin (LOVENOX) injection 30 mg Start: 08/08/22 1400 SCD's Start: 08/07/22 1530 Code Status: Full code Family Communication: Daughter at bedside  Status is: Inpatient Remains inpatient appropriate because: Plans to transition to CIR when bed is available  Subjective:  Patient feels much better this morning.  Denies any nausea, vomiting, chest pain.  Overall still feels weak but progressing better.  Examination: Constitutional: Not in acute distress, elderly frail Respiratory: Clear to auscultation bilaterally Cardiovascular: Normal sinus rhythm, no rubs Abdomen: Nontender nondistended good bowel sounds Musculoskeletal: No edema noted Skin: No rashes seen Neurologic: CN 2-12 grossly intact.  And nonfocal.  Bilateral upper and lower extremity strength 4/5 Psychiatric: Normal judgment and insight. Alert and oriented x 3. Normal mood. Objective: Vitals:   08/08/22 2030 08/09/22 0030 08/09/22 0627 08/09/22 1132  BP: (!) 208/80 (!) 191/51 (!) 219/92 (!) 220/85  Pulse: 73 66 73 66  Resp: 16 18 (!) 21 18  Temp: (!) 97.5 F (36.4 C) 97.7 F (36.5 C) 98 F (  36.7 C) (!) 97.4 F (36.3 C)  TempSrc: Oral Oral Oral Oral  SpO2: 96% 98% 97% 100%  Weight:      Height:        Intake/Output Summary (Last 24 hours) at 08/09/2022 1211 Last data filed at 08/09/2022 1054 Gross per 24 hour  Intake 1206.49 ml  Output 2000 ml   Net -793.51 ml   Filed Weights   08/07/22 1532  Weight: 63.4 kg     Data Reviewed:   CBC: Recent Labs  Lab 08/07/22 0822  WBC 13.7*  NEUTROABS 10.8*  HGB 15.0  HCT 46.8*  MCV 83.1  PLT 154   Basic Metabolic Panel: Recent Labs  Lab 08/07/22 0822 08/09/22 0418  NA 144 144  K 3.4* 3.2*  CL 110 115*  CO2 21* 19*  GLUCOSE 190* 98  BUN 38* 27*  CREATININE 1.63* 1.51*  CALCIUM 8.9 8.4*  MG 2.2 2.0   GFR: Estimated Creatinine Clearance: 27.3 mL/min (A) (by C-G formula based on SCr of 1.51 mg/dL (H)). Liver Function Tests: Recent Labs  Lab 08/07/22 0822  AST 20  ALT 23  ALKPHOS 114  BILITOT 0.9  PROT 7.0  ALBUMIN 3.4*   No results for input(s): "LIPASE", "AMYLASE" in the last 168 hours. No results for input(s): "AMMONIA" in the last 168 hours. Coagulation Profile: No results for input(s): "INR", "PROTIME" in the last 168 hours. Cardiac Enzymes: No results for input(s): "CKTOTAL", "CKMB", "CKMBINDEX", "TROPONINI" in the last 168 hours. BNP (last 3 results) No results for input(s): "PROBNP" in the last 8760 hours. HbA1C: Recent Labs    08/08/22 0456  HGBA1C 7.8*   CBG: Recent Labs  Lab 08/08/22 1126 08/08/22 1634 08/08/22 2025 08/09/22 0724 08/09/22 1139  GLUCAP 148* 167* 124* 90 109*   Lipid Profile: Recent Labs    08/08/22 0456  CHOL 159  HDL 34*  LDLCALC 100*  TRIG 127  CHOLHDL 4.7   Thyroid Function Tests: No results for input(s): "TSH", "T4TOTAL", "FREET4", "T3FREE", "THYROIDAB" in the last 72 hours. Anemia Panel: No results for input(s): "VITAMINB12", "FOLATE", "FERRITIN", "TIBC", "IRON", "RETICCTPCT" in the last 72 hours. Sepsis Labs: No results for input(s): "PROCALCITON", "LATICACIDVEN" in the last 168 hours.  Recent Results (from the past 240 hour(s))  Resp panel by RT-PCR (RSV, Flu A&B, Covid) Anterior Nasal Swab     Status: None   Collection Time: 07/30/22  5:04 PM   Specimen: Anterior Nasal Swab  Result Value Ref Range  Status   SARS Coronavirus 2 by RT PCR NEGATIVE NEGATIVE Final    Comment: (NOTE) SARS-CoV-2 target nucleic acids are NOT DETECTED.  The SARS-CoV-2 RNA is generally detectable in upper respiratory specimens during the acute phase of infection. The lowest concentration of SARS-CoV-2 viral copies this assay can detect is 138 copies/mL. A negative result does not preclude SARS-Cov-2 infection and should not be used as the sole basis for treatment or other patient management decisions. A negative result may occur with  improper specimen collection/handling, submission of specimen other than nasopharyngeal swab, presence of viral mutation(s) within the areas targeted by this assay, and inadequate number of viral copies(<138 copies/mL). A negative result must be combined with clinical observations, patient history, and epidemiological information. The expected result is Negative.  Fact Sheet for Patients:  EntrepreneurPulse.com.au  Fact Sheet for Healthcare Providers:  IncredibleEmployment.be  This test is no t yet approved or cleared by the [place] FDA and  has been authorized for detection and/or diagnosis of SARS-CoV-2  by FDA under an Emergency Use Authorization (EUA). This EUA will remain  in effect (meaning this test can be used) for the duration of the COVID-19 declaration under Section 564(b)(1) of the Act, 21 U.S.C.section 360bbb-3(b)(1), unless the authorization is terminated  or revoked sooner.       Influenza A by PCR NEGATIVE NEGATIVE Final   Influenza B by PCR NEGATIVE NEGATIVE Final    Comment: (NOTE) The Xpert Xpress SARS-CoV-2/FLU/RSV plus assay is intended as an aid in the diagnosis of influenza from Nasopharyngeal swab specimens and should not be used as a sole basis for treatment. Nasal washings and aspirates are unacceptable for Xpert Xpress SARS-CoV-2/FLU/RSV testing.  Fact Sheet for  Patients: EntrepreneurPulse.com.au  Fact Sheet for Healthcare Providers: IncredibleEmployment.be  This test is not yet approved or cleared by the [place] FDA and has been authorized for detection and/or diagnosis of SARS-CoV-2 by FDA under an Emergency Use Authorization (EUA). This EUA will remain in effect (meaning this test can be used) for the duration of the COVID-19 declaration under Section 564(b)(1) of the Act, 21 U.S.C. section 360bbb-3(b)(1), unless the authorization is terminated or revoked.     Resp Syncytial Virus by PCR NEGATIVE NEGATIVE Final    Comment: (NOTE) Fact Sheet for Patients: EntrepreneurPulse.com.au  Fact Sheet for Healthcare Providers: IncredibleEmployment.be  This test is not yet approved or cleared by the [place] FDA and has been authorized for detection and/or diagnosis of SARS-CoV-2 by FDA under an Emergency Use Authorization (EUA). This EUA will remain in effect (meaning this test can be used) for the duration of the COVID-19 declaration under Section 564(b)(1) of the Act, 21 U.S.C. section 360bbb-3(b)(1), unless the authorization is terminated or revoked.  Performed at [facility], [place] [address], [place], [place] [address]   Urine Culture     Status: Abnormal   Collection Time: 07/30/22  6:00 PM   Specimen: In/Out Cath Urine  Result Value Ref Range Status   Specimen Description IN/OUT CATH URINE  Final   Special Requests   Final    NONE Performed at [facility], [place] [address], [place], [place] [address]    Culture MULTIPLE SPECIES PRESENT, SUGGEST RECOLLECTION (A)  Final   Report Status 07/31/2022 FINAL  Final  Resp panel by RT-PCR (RSV, Flu A&B, Covid) Anterior Nasal Swab     Status: None   Collection Time: 08/07/22  9:11 AM   Specimen: Anterior Nasal Swab  Result Value Ref Range Status   SARS Coronavirus 2 by RT PCR NEGATIVE NEGATIVE  Final    Comment: (NOTE) SARS-CoV-2 target nucleic acids are NOT DETECTED.  The SARS-CoV-2 RNA is generally detectable in upper respiratory specimens during the acute phase of infection. The lowest concentration of SARS-CoV-2 viral copies this assay can detect is 138 copies/mL. A negative result does not preclude SARS-Cov-2 infection and should not be used as the sole basis for treatment or other patient management decisions. A negative result may occur with  improper specimen collection/handling, submission of specimen other than nasopharyngeal swab, presence of viral mutation(s) within the areas targeted by this assay, and inadequate number of viral copies(<138 copies/mL). A negative result must be combined with clinical observations, patient history, and epidemiological information. The expected result is Negative.  Fact Sheet for Patients:  EntrepreneurPulse.com.au  Fact Sheet for Healthcare Providers:  IncredibleEmployment.be  This test is no t yet approved or cleared by the [place] FDA and  has been authorized for detection and/or  diagnosis of SARS-CoV-2 by FDA under an Emergency Use Authorization (EUA). This EUA will remain  in effect (meaning this test can be used) for the duration of the COVID-19 declaration under Section 564(b)(1) of the Act, 21 U.S.C.section 360bbb-3(b)(1), unless the authorization is terminated  or revoked sooner.       Influenza A by PCR NEGATIVE NEGATIVE Final   Influenza B by PCR NEGATIVE NEGATIVE Final    Comment: (NOTE) The Xpert Xpress SARS-CoV-2/FLU/RSV plus assay is intended as an aid in the diagnosis of influenza from Nasopharyngeal swab specimens and should not be used as a sole basis for treatment. Nasal washings and aspirates are unacceptable for Xpert Xpress SARS-CoV-2/FLU/RSV testing.  Fact Sheet for Patients: EntrepreneurPulse.com.au  Fact Sheet for Healthcare  Providers: IncredibleEmployment.be  This test is not yet approved or cleared by the [place] FDA and has been authorized for detection and/or diagnosis of SARS-CoV-2 by FDA under an Emergency Use Authorization (EUA). This EUA will remain in effect (meaning this test can be used) for the duration of the COVID-19 declaration under Section 564(b)(1) of the Act, 21 U.S.C. section 360bbb-3(b)(1), unless the authorization is terminated or revoked.     Resp Syncytial Virus by PCR NEGATIVE NEGATIVE Final    Comment: (NOTE) Fact Sheet for Patients: EntrepreneurPulse.com.au  Fact Sheet for Healthcare Providers: IncredibleEmployment.be  This test is not yet approved or cleared by the [place] FDA and has been authorized for detection and/or diagnosis of SARS-CoV-2 by FDA under an Emergency Use Authorization (EUA). This EUA will remain in effect (meaning this test can be used) for the duration of the COVID-19 declaration under Section 564(b)(1) of the Act, 21 U.S.C. section 360bbb-3(b)(1), unless the authorization is terminated or revoked.  Performed at [facility], [place] [address], [place], [place] [address]          Radiology Studies: DG Swallowing Func-Speech Pathology  Result Date: 08/08/2022 Table formatting from the original result was not included. Objective Swallowing Evaluation: Type of Study: Bedside Swallow Evaluation  Patient Details Name: [name] MRN: [id number] Date of Birth: [birth year] Today's Date: 08/08/2022 Time: SLP Start Time (ACUTE ONLY): 1400 -SLP Stop Time (ACUTE ONLY): 1446 SLP Time Calculation (min) (ACUTE ONLY): 46 min Past Medical History: Past Medical History: Diagnosis Date  Allergy   Arthritis   Broken ankle   Cataract   Diabetes mellitus without complication ([place])   History of colonoscopy   Hypertension   Macular degeneration syndrome   with edema---being treated.   Stage 4  chronic kidney disease ([place])   Stroke ([place]) 2019 Past Surgical History: Past Surgical History: Procedure Laterality Date  callous removal  Left 2017  located on 1st digit on L foot 2/2 diabetes  EYE SURGERY   HPI: 77 yo female adm to [facility] with several days of lethargy - treated for UTI, readmitted after found to have drooling, left sided weakness and dysphagia.  Medical history significant of CVA w/ left side residuals, HTN, HLD, DM2, CKD4. 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   Pt passed [place], but noted to have ongoing symptoms of dysphagia.  Prior MOCA score 29/30 when pt had CVA 2019.  Subjective: pt awake in bed, dtr present  Recommendations for follow up therapy are one component of a multi-disciplinary discharge planning process, led by the attending physician.  Recommendations may be updated based  on patient status, additional functional criteria and insurance authorization. Assessment / Plan / Recommendation   08/08/2022   8:27 PM Clinical Impressions Clinical Impression Patient presents with moderate oropharyngeal dysphagia - with sensorimotor deficits.  She demonstrates moderately impaired oral control -most notably with thin and cracker boluses resulting in premature spillage of barium into pharynx and inconsistently oral retention.  Vertical mastication pattern with solid - at anterior oral cavity with partial cracker spilling to vallecular space poorly controlled. She required suctioning to remove portion of solid cracker that was retained left anterior oral cavity. Pharyngeal swallow with cracker bolus was not elicited until after 3rd bolus spilled into pharynx - at vallecular space - requiring 20 seconds with retained solid in pharynx before swallow was finally triggered. Pharyngeal swallow is severely delayed as barium pools at pyriform sinus with pt  frequently requiring verbal cue to swallow - most notably with liquids.  SLP counted "1,2,3.. Swallow" to improve pt's swallow timing.  Audible aspiration occured with nectar when pt was swallowing tablet due to impaired coordination resulting in prolonged oral transiting.  In addition, impaired adequacy of pharyngeal contraction resulted in pharyngeal retentin of nectar that spilled into airway postswallow.  Pt cleared large amount of nectar aspiration with reflexive cough but minimal amount remained.  Various postures including chin tuck and head turn were not helpful.  Due to pt's severe pharyngeal initiation delay, recommend nectar liquids and tsps of thin.  Pt will benefit from oral suction before, during and after meals.  SLP modified diet to changed diet order to dys1/nectar - and educated pt/daughter to findings, recommendations. Will follow for dysphagia management and treatment. With current level of dysphagia, adequacy of po intake for hydration and nutrition is a concern.  Family reports pt has always "swallowed slowly" causing SLP to question if pt may have undiagnosed dysphagia since her 2019 CVA.  SLP Visit Diagnosis Dysphagia, oropharyngeal phase (R13.12);Dysphagia, unspecified (R13.10) Impact on safety and function Moderate aspiration risk;Severe aspiration risk;Risk for inadequate nutrition/hydration     08/08/2022   8:27 PM Treatment Recommendations Treatment Recommendations Therapy as outlined in treatment plan below     08/08/2022   8:48 PM Prognosis Prognosis for Safe Diet Advancement Fair Barriers to Reach Goals Severity of deficits   08/08/2022   8:27 PM Diet Recommendations SLP Diet Recommendations Dysphagia 1 (Puree) solids;Nectar thick liquid Liquid Administration via Spoon;Cup;Straw Medication Administration Whole meds with puree Compensations Small sips/bites;Slow rate;Other (Comment) Postural Changes Seated upright at 90 degrees;Remain semi-upright after after feeds/meals (Comment)      08/08/2022   8:27 PM Other Recommendations Oral Care Recommendations Oral care before and after PO;Other (Comment) Other Recommendations Have oral suction available;Order thickener from pharmacy Follow Up Recommendations Acute inpatient rehab (3hours/day) Functional Status Assessment Patient has had a recent decline in their functional status and demonstrates the ability to make significant improvements in function in a reasonable and predictable amount of time.   08/08/2022   8:27 PM Frequency and Duration  Speech Therapy Frequency (ACUTE ONLY) min 2x/week Treatment Duration 2 weeks     08/08/2022   8:20 PM Oral Phase Oral Phase Impaired Oral - Honey Teaspoon Delayed oral transit;Premature spillage;Decreased bolus cohesion;Weak lingual manipulation Oral - Nectar Teaspoon Delayed oral transit;Premature spillage;Decreased bolus cohesion;Weak lingual manipulation;Left anterior bolus loss Oral - Nectar Cup Premature spillage;Decreased bolus cohesion;Weak lingual manipulation;Delayed oral transit Oral - Nectar Straw Weak lingual manipulation;Premature spillage;Decreased bolus cohesion;Delayed oral transit Oral - Thin Teaspoon Premature spillage;Weak lingual manipulation;Left anterior bolus loss;Decreased bolus cohesion  Oral - Thin Cup Premature spillage;Weak lingual manipulation;Decreased bolus cohesion;Lingual/palatal residue Oral - Thin Straw Premature spillage;Weak lingual manipulation;Left anterior bolus loss;Decreased bolus cohesion;Lingual/palatal residue Oral - Puree Lingual pumping;Weak lingual manipulation;Delayed oral transit;Premature spillage;Lingual/palatal residue Oral - Mech Soft Decreased bolus cohesion;Premature spillage;Impaired mastication;Weak lingual manipulation;Reduced posterior propulsion;Pocketing in anterior sulcus;Lingual/palatal residue Oral - Pill Premature spillage;Decreased bolus cohesion;Reduced posterior propulsion;Weak lingual manipulation;Lingual/palatal residue Oral Phase - Comment oral  suction provided before, during and after MBS, pt allows secretions to pool in pharynx; oral transiting delays up to 20 seconds - or as little as 4 seconds, oral retention spills into pharynx post-swallow    08/08/2022   8:23 PM Pharyngeal Phase Pharyngeal Phase Impaired Pharyngeal- Honey Teaspoon Delayed swallow initiation-pyriform sinuses;Delayed swallow initiation-vallecula Pharyngeal Material does not enter airway Pharyngeal- Nectar Teaspoon Delayed swallow initiation-pyriform sinuses Pharyngeal Material does not enter airway Pharyngeal- Nectar Cup Delayed swallow initiation-pyriform sinuses Pharyngeal Material does not enter airway Pharyngeal- Nectar Straw Delayed swallow initiation-pyriform sinuses Pharyngeal Material does not enter airway Pharyngeal- Thin Teaspoon Penetration/Aspiration during swallow;Delayed swallow initiation-pyriform sinuses Pharyngeal Material enters airway, remains ABOVE vocal cords and not ejected out Pharyngeal- Thin Cup Delayed swallow initiation-pyriform sinuses;Penetration/Aspiration during swallow Pharyngeal Material enters airway, remains ABOVE vocal cords and not ejected out Pharyngeal- Thin Straw Delayed swallow initiation-pyriform sinuses;Penetration/Aspiration during swallow Pharyngeal Material enters airway, remains ABOVE vocal cords and not ejected out Pharyngeal- Puree Delayed swallow initiation-vallecula Pharyngeal Material does not enter airway Pharyngeal- Mechanical Soft Delayed swallow initiation-pyriform sinuses;Delayed swallow initiation-vallecula Pharyngeal Material does not enter airway Pharyngeal- Pill Penetration/Apiration after swallow;Reduced laryngeal elevation;Reduced airway/laryngeal closure;Moderate aspiration;Pharyngeal residue - pyriform Pharyngeal Material enters airway, passes BELOW cords and not ejected out despite cough attempt by patient Pharyngeal Comment Pharyngeal swallow severely delayed - triggering at pyriform sinus consistently requring counting  cues to trigger swallow, aspiration occured with nectar mixed with secretions due to impaired coordination with swallow resulting in prolonged oral transiting - impaired adequacy of pharyngeal contraction with retention of nectar spilling into airway postswallow - causing reflexive cough.  Pt cleared large amount of aspiration with reflexive cough but minimal amount remained.    08/08/2022   8:27 PM Cervical Esophageal Phase  Cervical Esophageal Phase Impaired Barium tablet appeared to stall at distal esophagus, more liquids aided clearance. Pt also appeared with dilated esophagus with barium retention that appeared mixed with secretions.  Radiologist was not present and MBS does not diagnose below the UES.  Suspect component of esophageal motility deficits. [name] Lime, MS [facility] SLP Acute Rehab Services Office [phone] Macario Golds 08/08/2022, 8:51 PM                     DG Abd 1 View  Result Date: 08/08/2022 CLINICAL DATA:  Abdominal distension. EXAM: ABDOMEN - 1 VIEW COMPARISON:  Pelvic CT 09/21/2018.  Chest radiographs 08/07/2022. FINDINGS: 1054 hours. Two supine views are submitted. The bowel gas pattern is normal. No supine evidence of free intraperitoneal air. Possible right renal calculus. There are scattered vascular calcifications. The bladder appears mildly distended. No acute osseous findings are seen. There is telemetry leads overlie the abdomen and lower chest. IMPRESSION: No evidence of bowel obstruction or other acute findings. Possible right renal calculus and bladder distension. Electronically Signed   By: Richardean Sale M.D.   On: 08/08/2022 11:12        Scheduled Meds:  aspirin EC  81 mg Oral Daily   clopidogrel  75 mg Oral Daily   enoxaparin (LOVENOX) injection  30 mg Subcutaneous  Q24H   ezetimibe  10 mg Oral Daily   fenofibrate  160 mg Oral Daily   insulin aspart  0-9 Units Subcutaneous TID WC   losartan  25 mg Oral Daily   sertraline  25 mg Oral Daily   Continuous  Infusions:  sodium chloride 50 mL/hr at 08/09/22 0855   potassium chloride 10 mEq (08/09/22 1058)     LOS: 2 days   Time spent= 35 mins    Yeilin Zweber Arsenio Loader, MD [facility]  If 7PM-7AM, please contact night-coverage  08/09/2022, 12:11 PM

## 2022-08-10 DIAGNOSIS — I63012 Cerebral infarction due to thrombosis of left vertebral artery: Secondary | ICD-10-CM | POA: Diagnosis not present

## 2022-08-10 LAB — CBC
HCT: 39.7 % (ref 36.0–46.0)
Hemoglobin: 12.6 g/dL (ref 12.0–15.0)
MCH: 26.8 pg (ref 26.0–34.0)
MCHC: 31.7 g/dL (ref 30.0–36.0)
MCV: 84.5 fL (ref 80.0–100.0)
Platelets: 277 10*3/uL (ref 150–400)
RBC: 4.7 MIL/uL (ref 3.87–5.11)
RDW: 14.6 % (ref 11.5–15.5)
WBC: 8.4 10*3/uL (ref 4.0–10.5)
nRBC: 0 % (ref 0.0–0.2)

## 2022-08-10 LAB — BASIC METABOLIC PANEL
Anion gap: 6 (ref 5–15)
BUN: 22 mg/dL (ref 8–23)
CO2: 21 mmol/L — ABNORMAL LOW (ref 22–32)
Calcium: 8.4 mg/dL — ABNORMAL LOW (ref 8.9–10.3)
Chloride: 113 mmol/L — ABNORMAL HIGH (ref 98–111)
Creatinine, Ser: 1.65 mg/dL — ABNORMAL HIGH (ref 0.44–1.00)
GFR, Estimated: 32 mL/min — ABNORMAL LOW (ref >60)
Glucose, Bld: 115 mg/dL — ABNORMAL HIGH (ref 70–99)
Potassium: 3.8 mmol/L (ref 3.5–5.1)
Sodium: 140 mmol/L (ref 135–145)

## 2022-08-10 LAB — GLUCOSE, CAPILLARY
Glucose-Capillary: 115 mg/dL — ABNORMAL HIGH (ref 70–99)
Glucose-Capillary: 170 mg/dL — ABNORMAL HIGH (ref 70–99)
Glucose-Capillary: 135 mg/dL — ABNORMAL HIGH (ref 70–99)
Glucose-Capillary: 135 mg/dL — ABNORMAL HIGH (ref 70–99)

## 2022-08-10 LAB — MAGNESIUM: Magnesium: 2.4 mg/dL (ref 1.7–2.4)

## 2022-08-10 MED ORDER — HYDRALAZINE HCL 25 MG PO TABS
25.0000 mg | ORAL_TABLET | Freq: Three times a day (TID) | ORAL | Status: DC
  Administered 2022-08-10 – 2022-08-12 (×4): 25 mg via ORAL
  Filled 2022-08-10 (×7): qty 1

## 2022-08-10 MED ORDER — POTASSIUM CHLORIDE 10 MEQ/100ML IV SOLN
10.0000 meq | INTRAVENOUS | Status: AC
  Administered 2022-08-10 (×2): 10 meq via INTRAVENOUS
  Filled 2022-08-10 (×2): qty 100

## 2022-08-10 MED ORDER — POLYETHYLENE GLYCOL 3350 17 G PO PACK
17.0000 g | PACK | Freq: Two times a day (BID) | ORAL | Status: DC
  Administered 2022-08-10 – 2022-08-13 (×4): 17 g via ORAL
  Filled 2022-08-10 (×4): qty 1

## 2022-08-10 MED ORDER — HYDRALAZINE HCL 20 MG/ML IJ SOLN
10.0000 mg | INTRAMUSCULAR | Status: DC | PRN
  Administered 2022-08-10: 10 mg via INTRAVENOUS
  Filled 2022-08-10: qty 1

## 2022-08-10 MED ORDER — BISACODYL 5 MG PO TBEC
10.0000 mg | DELAYED_RELEASE_TABLET | Freq: Once | ORAL | Status: AC
  Administered 2022-08-10: 10 mg via ORAL
  Filled 2022-08-10: qty 2

## 2022-08-10 MED ORDER — CHLORHEXIDINE GLUCONATE CLOTH 2 % EX PADS
6.0000 | MEDICATED_PAD | Freq: Every day | CUTANEOUS | Status: DC
  Administered 2022-08-10: 6 via TOPICAL

## 2022-08-10 MED ORDER — SENNOSIDES-DOCUSATE SODIUM 8.6-50 MG PO TABS: 2.0000 | ORAL_TABLET | Freq: Every evening | ORAL | Status: DC | PRN

## 2022-08-10 NOTE — Progress Notes (Signed)
Foley catheter removed at 1130 per order for voiding trial

## 2022-08-10 NOTE — Progress Notes (Signed)
Physical Therapy Treatment Patient Details Name: [name] MRN: [id number] DOB: 11/05/1945 Today's Date: 08/10/2022   History of Present Illness [name] is a 77 yr old female admitted to the hospital with slurred speech and difficulty swallowing. A MRI of the brain revealed the following: 1. 4 mm acute left medullary infarct.  2. Progressive chronic small vessel ischemic disease since 2019 with  multiple chronic infarcts as above. PMH: CVA with L sided deficits, HTN, HLD, DM II, CKD IV, arthritis, macular degeneration    PT Comments    The patient  is eager to work on mobility and  standing. Patient's daughter reports that patient seems more sleepy in PM, ? Medication per daughter..  Patient requiring 2 persons for bed  mobility to sitting. Stood at [employer] x 3 and stepped pivot to and from [facility] Patient  demonstrates improved   ability to assist in mobility. Patient is an excellent AIR candidate..   Recommendations for follow up therapy are one component of a multi-disciplinary discharge planning process, led by the attending physician.  Recommendations may be updated based on patient status, additional functional criteria and insurance authorization.  Follow Up Recommendations  Acute inpatient rehab (3hours/day)     Assistance Recommended at Discharge Frequent or constant Supervision/Assistance  Patient can return home with the following Two people to help with walking and/or transfers;Two people to help with bathing/dressing/bathroom;Help with stairs or ramp for entrance;Assist for transportation;Assistance with cooking/housework   Equipment Recommendations  None recommended by PT    Recommendations for Other Services       Precautions / Restrictions Precautions Precautions: Fall Precaution Comments: L HEMI CVA 3 years ago Required Braces or Orthoses: Other Brace Other Brace: R ankle stabilizing brace Restrictions RLE Weight Bearing: Weight bearing as tolerated LLE Weight  Bearing: Weight bearing as tolerated Other Position/Activity Restrictions: Old L sided weakness from a prior CVA from ~3 years ago. History of R abkle fracture ~1-1.5 yr ago     Mobility  Bed Mobility   Bed Mobility: Supine to Sit, Sit to Supine     Supine to sit: Max assist, +2 for safety/equipment, HOB elevated Sit to supine: +2 for physical assistance, +2 for safety/equipment, Max assist   General bed mobility comments: patient did reach for rail with right UE, began to slide legs toward bed ege, max assis to fully sit upright. Once in sitting able to sit   with shift to right with min guard. Assisted back into bed , assited with legs and trunk    Transfers   Equipment used: Rolling walker (2 wheels), Left platform walker Transfers: Sit to/from Stand Sit to Stand: Mod assist, Max assist, +2 physical assistance, +2 safety/equipment           General transfer comment: Pt performed sit to stand  x 2 at platform Rw but tended to shift  more to the right , Stood at regular RW, patient slightly gripping with Left hand on RW and demonstated  less shifting, patient   stepped to [facility] with max assistance and back to the bed after atempted toileting..  .    Ambulation/Gait                   Stairs             Wheelchair Mobility    Modified Rankin (Stroke Patients Only)       Balance Overall balance assessment: Needs assistance Sitting-balance support: Feet supported, Single extremity supported Sitting balance-Leahy Scale:  Fair Sitting balance - Comments: fair  once  seated near midline, supports with RUE   Standing balance support: Bilateral upper extremity supported, During functional activity, Reliant on assistive device for balance Standing balance-Leahy Scale: Poor                              Cognition Arousal/Alertness: Awake/alert Behavior During Therapy: WFL for tasks assessed/performed                                    General Comments: Ax[facility] x 3 feeling "really sleepy" today.  Following all directions.        Exercises      General Comments        Pertinent Vitals/Pain Pain Assessment Pain Assessment: No/denies pain    Home Living                          Prior Function            PT Goals (current goals can now be found in the care plan section) Progress towards PT goals: Progressing toward goals    Frequency    Min 4X/week      PT Plan Current plan remains appropriate    Co-evaluation              AM-PAC PT "6 Clicks" Mobility   Outcome Measure  Help needed turning from your back to your side while in a flat bed without using bedrails?: A Lot Help needed moving from lying on your back to sitting on the side of a flat bed without using bedrails?: Total Help needed moving to and from a bed to a chair (including a wheelchair)?: Total Help needed standing up from a chair using your arms (e.g., wheelchair or bedside chair)?: Total Help needed to walk in hospital room?: Total Help needed climbing 3-5 steps with a railing? : Total 6 Click Score: 7    End of Session Equipment Utilized During Treatment: Gait belt Activity Tolerance: Patient tolerated treatment well Patient left: in bed;with call bell/phone within reach;with bed alarm set;with family/visitor present Nurse Communication: Mobility status PT Visit Diagnosis: Unsteadiness on feet (R26.81);Muscle weakness (generalized) (M62.81);Difficulty in walking, not elsewhere classified (R26.2);Hemiplegia and hemiparesis Hemiplegia - Right/Left: Left Hemiplegia - dominant/non-dominant: Non-dominant     Time: 9977-4142 PT Time Calculation (min) (ACUTE ONLY): 29 min  Charges:  $Therapeutic Activity: 23-37 mins                   [place] Office [phone] Weekend [phone]   Claretha Cooper 08/10/2022, 3:49 PM

## 2022-08-10 NOTE — Progress Notes (Signed)
PT Cancellation Note  Patient Details Name: [name] MRN: [id number] DOB: [birth year]   Cancelled Treatment:    Reason Eval/Treat Not Completed: Medical issues which prohibited therapy BP is high.  [place] Office [phone] Weekend [phone]   Claretha Cooper 08/10/2022, 8:36 AM

## 2022-08-10 NOTE — Progress Notes (Signed)
PROGRESS NOTE    [name]  HER:[id number] DOB: 10-09-1945 DOA: 08/07/2022 PCP: Lauree Chandler, NP   Brief Narrative:  78 year old with history of CVA in 2019, HTN, HLD, DM 2, CKD stage 3aI, recent UTI brought to the hospital for increasing slurred speech and some difficulty swallowing.  At baseline patient does have left-sided weakness.  About a week ago she came to the ER with similar complaints, CT of the head was negative and UA showed possible UTI therefore discharged on antibiotics.  During this admission CT of the head was negative but MRI showed acute left lateral medullary infarct.  Neurology team was consulted.  It was decided for patient to be on aspirin and Plavix for 3 weeks followed by aspirin alone.  PT/OT recommended CIR therefore inpatient rehab team was consulted.  Due to dysphagia she was seen by speech and swallow therapy and deemed to be a moderate risk therefore their service will continue working with the patient.   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  PT/OT recommended CIR, inpatient rehab team consulted. - A1c-7.8, lipid panel-100 - Speech and swallow elevation performed and she is deemed to be moderate risk for aspiration.  Speech therapist will continue working with the patient.  Acute urinary retention - Discontinue Foley, will try voiding trial  Renal stone - Nonobstructive.  Renal function appears to be normal.  Advised oral hydration  Hyperlipidemia -Lipid panel LDL 100 - On home Zetia and Tricor  Essential hypertension,  uncontrolled - On losartan.  Add p.o. hydralazine  Diabetes mellitus type 2 -Sliding scale and Accu-Cheks.  Previously patient has declined initiation of any diabetic medications per documentation  CKD 3a -Creatinine currently at baseline of 1.6  Hypokalemia -Replete as needed  Recent urinary tract infection - Patient was prescribed Keflex but did not really take it as it caused an allergic reaction?Marland Kitchen  At this time she is not symptomatic therefore we will hold off on further antibiotics.  Leukocytosis improved    DVT prophylaxis: enoxaparin (LOVENOX) injection 30 mg Start: 08/08/22 1400 SCD's Start: 08/07/22 1530 Code Status: Full code Family Communication: Daughter at bedside  Status is: Inpatient Remains inpatient appropriate because: Plans to transition to CIR when bed is available  Subjective:  Feeling well no complaints  Examination:  Constitutional: Not in acute distress, elderly frail Respiratory: Clear to auscultation bilaterally Cardiovascular: Normal sinus rhythm, no rubs Abdomen: Nontender nondistended good bowel sounds Musculoskeletal: No edema noted Skin: No rashes seen Neurologic: CN 2-12 grossly intact.  And nonfocal.  Bilateral upper and lower extremity strength 4/5 Psychiatric: Normal judgment and insight. Alert and oriented x 3. Normal mood. Objective: Vitals:   08/10/22 1000 08/10/22 1030 08/10/22 1100 08/10/22 1230  BP: (!) 164/63 (!) 170/71 (!) 148/47 (!) 164/79  Pulse: 77 72 72 81  Resp: 18 17 16 16   Temp:      TempSrc:      SpO2: 96% 98% 97% 99%  Weight:      Height:        Intake/Output Summary (Last 24 hours)  at 08/10/2022 1255 Last data filed at 08/10/2022 0200 Gross per 24 hour  Intake 663.38 ml  Output 475 ml  Net 188.38 ml   Filed Weights   08/07/22 1532  Weight: 63.4 kg     Data Reviewed:   CBC: Recent Labs  Lab 08/07/22 0822 08/10/22 0459  WBC 13.7* 8.4  NEUTROABS 10.8*  --   HGB 15.0 12.6  HCT 46.8* 39.7  MCV 83.1  84.5  PLT 363 924   Basic Metabolic Panel: Recent Labs  Lab 08/07/22 0822 08/09/22 0418 08/10/22 0459  NA 144 144 140  K 3.4* 3.2* 3.8  CL 110 115* 113*  CO2 21* 19* 21*  GLUCOSE 190* 98 115*  BUN 38* 27* 22  CREATININE 1.63* 1.51* 1.65*  CALCIUM 8.9 8.4* 8.4*  MG 2.2 2.0 2.4   GFR: Estimated Creatinine Clearance: 25 mL/min (A) (by C-G formula based on SCr of 1.65 mg/dL (H)). Liver Function Tests: Recent Labs  Lab 08/07/22 0822  AST 20  ALT 23  ALKPHOS 114  BILITOT 0.9  PROT 7.0  ALBUMIN 3.4*   No results for input(s): "LIPASE", "AMYLASE" in the last 168 hours. No results for input(s): "AMMONIA" in the last 168 hours. Coagulation Profile: No results for input(s): "INR", "PROTIME" in the last 168 hours. Cardiac Enzymes: No results for input(s): "CKTOTAL", "CKMB", "CKMBINDEX", "TROPONINI" in the last 168 hours. BNP (last 3 results) No results for input(s): "PROBNP" in the last 8760 hours. HbA1C: Recent Labs    08/08/22 0456  HGBA1C 7.8*   CBG: Recent Labs  Lab 08/09/22 1139 08/09/22 1607 08/09/22 2057 08/10/22 0737 08/10/22 1129  GLUCAP 109* 158* 181* 115* 170*   Lipid Profile: Recent Labs    08/08/22 0456  CHOL 159  HDL 34*  LDLCALC 100*  TRIG 127  CHOLHDL 4.7   Thyroid Function Tests: No results for input(s): "TSH", "T4TOTAL", "FREET4", "T3FREE", "THYROIDAB" in the last 72 hours. Anemia Panel: No results for input(s): "VITAMINB12", "FOLATE", "FERRITIN", "TIBC", "IRON", "RETICCTPCT" in the last 72 hours. Sepsis Labs: No results for input(s): "PROCALCITON", "LATICACIDVEN" in the last 168 hours.  Recent Results (from the past 240 hour(s))  Resp panel by RT-PCR (RSV, Flu A&B, Covid) Anterior Nasal Swab     Status: None   Collection Time: 08/07/22  9:11 AM   Specimen: Anterior Nasal Swab  Result Value Ref Range Status   SARS Coronavirus 2 by RT PCR NEGATIVE NEGATIVE Final    Comment: (NOTE) SARS-CoV-2 target nucleic acids are NOT  DETECTED.  The SARS-CoV-2 RNA is generally detectable in upper respiratory specimens during the acute phase of infection. The lowest concentration of SARS-CoV-2 viral copies this assay can detect is 138 copies/mL. A negative result does not preclude SARS-Cov-2 infection and should not be used as the sole basis for treatment or other patient management decisions. A negative result may occur with  improper specimen collection/handling, submission of specimen other than nasopharyngeal swab, presence of viral mutation(s) within the areas targeted by this assay, and inadequate number of viral copies(<138 copies/mL). A negative result must be combined with clinical observations, patient history, and epidemiological information. The expected result is Negative.  Fact Sheet for Patients:  EntrepreneurPulse.com.au  Fact Sheet for Healthcare Providers:  IncredibleEmployment.be  This test is no t yet approved or cleared by the [place] FDA and  has been authorized for detection and/or diagnosis of SARS-CoV-2 by FDA under an Emergency Use Authorization (EUA). This EUA will remain  in effect (meaning this test  can be used) for the duration of the COVID-19 declaration under Section 564(b)(1) of the Act, 21 U.S.C.section 360bbb-3(b)(1), unless the authorization is terminated  or revoked sooner.       Influenza A by PCR NEGATIVE NEGATIVE Final   Influenza B by PCR NEGATIVE NEGATIVE Final    Comment: (NOTE) The Xpert Xpress SARS-CoV-2/FLU/RSV plus assay is intended as an aid in the diagnosis of influenza from Nasopharyngeal swab specimens and should not be used as a sole basis for treatment. Nasal washings and aspirates are unacceptable for Xpert Xpress SARS-CoV-2/FLU/RSV testing.  Fact Sheet for Patients: EntrepreneurPulse.com.au  Fact Sheet for Healthcare Providers: IncredibleEmployment.be  This test is not yet  approved or cleared by the [place] FDA and has been authorized for detection and/or diagnosis of SARS-CoV-2 by FDA under an Emergency Use Authorization (EUA). This EUA will remain in effect (meaning this test can be used) for the duration of the COVID-19 declaration under Section 564(b)(1) of the Act, 21 U.S.C. section 360bbb-3(b)(1), unless the authorization is terminated or revoked.     Resp Syncytial Virus by PCR NEGATIVE NEGATIVE Final    Comment: (NOTE) Fact Sheet for Patients: EntrepreneurPulse.com.au  Fact Sheet for Healthcare Providers: IncredibleEmployment.be  This test is not yet approved or cleared by the [place] FDA and has been authorized for detection and/or diagnosis of SARS-CoV-2 by FDA under an Emergency Use Authorization (EUA). This EUA will remain in effect (meaning this test can be used) for the duration of the COVID-19 declaration under Section 564(b)(1) of the Act, 21 U.S.C. section 360bbb-3(b)(1), unless the authorization is terminated or revoked.  Performed at [facility], [place] [address] [place], [place] [address]          Radiology Studies: DG Swallowing Func-Speech Pathology  Result Date: 08/08/2022 Table formatting from the original result was not included. Objective Swallowing Evaluation: Type of Study: Bedside Swallow Evaluation  Patient Details Name: RONDALYN BELFORD MRN: [id number] Date of Birth: 07-03-1946 Today's Date: 08/08/2022 Time: SLP Start Time (ACUTE ONLY): 1400 -SLP Stop Time (ACUTE ONLY): 1446 SLP Time Calculation (min) (ACUTE ONLY): 46 min Past Medical History: Past Medical History: Diagnosis Date  Allergy   Arthritis   Broken ankle   Cataract   Diabetes mellitus without complication ([place])   History of colonoscopy   Hypertension   Macular degeneration syndrome   with edema---being treated.   Stage 4 chronic kidney disease ([place])   Stroke ([place]) 2019 Past Surgical History: Past  Surgical History: Procedure Laterality Date  callous removal  Left 2017  located on 1st digit on L foot 2/2 diabetes  EYE SURGERY   HPI: 77 yo female adm [facility] with several days of lethargy - treated for UTI, readmitted after found to have drooling, left sided weakness and dysphagia.  Medical history significant of CVA w/ left side residuals, HTN, HLD, DM2, CKD4. 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   Pt passed [place], but noted to have ongoing symptoms of dysphagia.  Prior MOCA score 29/30 when pt had CVA 2019.  Subjective: pt awake in bed, dtr present  Recommendations for follow up therapy are one component of a multi-disciplinary discharge planning process, led by the attending physician.  Recommendations may be updated based on patient status, additional functional criteria and insurance authorization. Assessment / Plan / Recommendation   08/08/2022   8:27 PM  Clinical Impressions Clinical Impression Patient presents with moderate oropharyngeal dysphagia - with sensorimotor deficits.  She demonstrates moderately impaired oral control -most notably with thin and cracker boluses resulting in premature spillage of barium into pharynx and inconsistently oral retention.  Vertical mastication pattern with solid - at anterior oral cavity with partial cracker spilling to vallecular space poorly controlled. She required suctioning to remove portion of solid cracker that was retained left anterior oral cavity. Pharyngeal swallow with cracker bolus was not elicited until after 3rd bolus spilled into pharynx - at vallecular space - requiring 20 seconds with retained solid in pharynx before swallow was finally triggered. Pharyngeal swallow is severely delayed as barium pools at pyriform sinus with pt frequently requiring verbal cue to swallow - most notably with liquids.  SLP counted  "1,2,3.. Swallow" to improve pt's swallow timing.  Audible aspiration occured with nectar when pt was swallowing tablet due to impaired coordination resulting in prolonged oral transiting.  In addition, impaired adequacy of pharyngeal contraction resulted in pharyngeal retentin of nectar that spilled into airway postswallow.  Pt cleared large amount of nectar aspiration with reflexive cough but minimal amount remained.  Various postures including chin tuck and head turn were not helpful.  Due to pt's severe pharyngeal initiation delay, recommend nectar liquids and tsps of thin.  Pt will benefit from oral suction before, during and after meals.  SLP modified diet to changed diet order to dys1/nectar - and educated pt/daughter to findings, recommendations. Will follow for dysphagia management and treatment. With current level of dysphagia, adequacy of po intake for hydration and nutrition is a concern.  Family reports pt has always "swallowed slowly" causing SLP to question if pt may have undiagnosed dysphagia since her 2019 CVA.  SLP Visit Diagnosis Dysphagia, oropharyngeal phase (R13.12);Dysphagia, unspecified (R13.10) Impact on safety and function Moderate aspiration risk;Severe aspiration risk;Risk for inadequate nutrition/hydration     08/08/2022   8:27 PM Treatment Recommendations Treatment Recommendations Therapy as outlined in treatment plan below     08/08/2022   8:48 PM Prognosis Prognosis for Safe Diet Advancement Fair Barriers to Reach Goals Severity of deficits   08/08/2022   8:27 PM Diet Recommendations SLP Diet Recommendations Dysphagia 1 (Puree) solids;Nectar thick liquid Liquid Administration via Spoon;Cup;Straw Medication Administration Whole meds with puree Compensations Small sips/bites;Slow rate;Other (Comment) Postural Changes Seated upright at 90 degrees;Remain semi-upright after after feeds/meals (Comment)     08/08/2022   8:27 PM Other Recommendations Oral Care Recommendations Oral care before and  after PO;Other (Comment) Other Recommendations Have oral suction available;Order thickener from pharmacy Follow Up Recommendations Acute inpatient rehab (3hours/day) Functional Status Assessment Patient has had a recent decline in their functional status and demonstrates the ability to make significant improvements in function in a reasonable and predictable amount of time.   08/08/2022   8:27 PM Frequency and Duration  Speech Therapy Frequency (ACUTE ONLY) min 2x/week Treatment Duration 2 weeks     08/08/2022   8:20 PM Oral Phase Oral Phase Impaired Oral - Honey Teaspoon Delayed oral transit;Premature spillage;Decreased bolus cohesion;Weak lingual manipulation Oral - Nectar Teaspoon Delayed oral transit;Premature spillage;Decreased bolus cohesion;Weak lingual manipulation;Left anterior bolus loss Oral - Nectar Cup Premature spillage;Decreased bolus cohesion;Weak lingual manipulation;Delayed oral transit Oral - Nectar Straw Weak lingual manipulation;Premature spillage;Decreased bolus cohesion;Delayed oral transit Oral - Thin Teaspoon Premature spillage;Weak lingual manipulation;Left anterior bolus loss;Decreased bolus cohesion Oral - Thin Cup Premature spillage;Weak lingual manipulation;Decreased bolus cohesion;Lingual/palatal residue Oral - Thin Straw Premature spillage;Weak lingual manipulation;Left anterior bolus  loss;Decreased bolus cohesion;Lingual/palatal residue Oral - Puree Lingual pumping;Weak lingual manipulation;Delayed oral transit;Premature spillage;Lingual/palatal residue Oral - Mech Soft Decreased bolus cohesion;Premature spillage;Impaired mastication;Weak lingual manipulation;Reduced posterior propulsion;Pocketing in anterior sulcus;Lingual/palatal residue Oral - Pill Premature spillage;Decreased bolus cohesion;Reduced posterior propulsion;Weak lingual manipulation;Lingual/palatal residue Oral Phase - Comment oral suction provided before, during and after MBS, pt allows secretions to pool in pharynx;  oral transiting delays up to 20 seconds - or as little as 4 seconds, oral retention spills into pharynx post-swallow    08/08/2022   8:23 PM Pharyngeal Phase Pharyngeal Phase Impaired Pharyngeal- Honey Teaspoon Delayed swallow initiation-pyriform sinuses;Delayed swallow initiation-vallecula Pharyngeal Material does not enter airway Pharyngeal- Nectar Teaspoon Delayed swallow initiation-pyriform sinuses Pharyngeal Material does not enter airway Pharyngeal- Nectar Cup Delayed swallow initiation-pyriform sinuses Pharyngeal Material does not enter airway Pharyngeal- Nectar Straw Delayed swallow initiation-pyriform sinuses Pharyngeal Material does not enter airway Pharyngeal- Thin Teaspoon Penetration/Aspiration during swallow;Delayed swallow initiation-pyriform sinuses Pharyngeal Material enters airway, remains ABOVE vocal cords and not ejected out Pharyngeal- Thin Cup Delayed swallow initiation-pyriform sinuses;Penetration/Aspiration during swallow Pharyngeal Material enters airway, remains ABOVE vocal cords and not ejected out Pharyngeal- Thin Straw Delayed swallow initiation-pyriform sinuses;Penetration/Aspiration during swallow Pharyngeal Material enters airway, remains ABOVE vocal cords and not ejected out Pharyngeal- Puree Delayed swallow initiation-vallecula Pharyngeal Material does not enter airway Pharyngeal- Mechanical Soft Delayed swallow initiation-pyriform sinuses;Delayed swallow initiation-vallecula Pharyngeal Material does not enter airway Pharyngeal- Pill Penetration/Apiration after swallow;Reduced laryngeal elevation;Reduced airway/laryngeal closure;Moderate aspiration;Pharyngeal residue - pyriform Pharyngeal Material enters airway, passes BELOW cords and not ejected out despite cough attempt by patient Pharyngeal Comment Pharyngeal swallow severely delayed - triggering at pyriform sinus consistently requring counting cues to trigger swallow, aspiration occured with nectar mixed with secretions due to  impaired coordination with swallow resulting in prolonged oral transiting - impaired adequacy of pharyngeal contraction with retention of nectar spilling into airway postswallow - causing reflexive cough.  Pt cleared large amount of aspiration with reflexive cough but minimal amount remained.    08/08/2022   8:27 PM Cervical Esophageal Phase  Cervical Esophageal Phase Impaired Barium tablet appeared to stall at distal esophagus, more liquids aided clearance. Pt also appeared with dilated esophagus with barium retention that appeared mixed with secretions.  Radiologist was not present and MBS does not diagnose below the UES.  Suspect component of esophageal motility deficits. Kathleen Lime, MS [facility] SLP Acute Rehab Services Office [phone] Macario Golds 08/08/2022, 8:51 PM                          Scheduled Meds:  aspirin EC  81 mg Oral Daily   Chlorhexidine Gluconate Cloth  6 each Topical Daily   clopidogrel  75 mg Oral Daily   enoxaparin (LOVENOX) injection  30 mg Subcutaneous Q24H   ezetimibe  10 mg Oral Daily   fenofibrate  160 mg Oral Daily   hydrALAZINE  25 mg Oral Q8H   insulin aspart  0-9 Units Subcutaneous TID WC   losartan  25 mg Oral Daily   polyethylene glycol  17 g Oral BID   sertraline  25 mg Oral Daily   Continuous Infusions:     LOS: 3 days   Time spent= 35 mins    Lailoni Baquera Arsenio Loader, MD [facility]  If 7PM-7AM, please contact night-coverage  08/10/2022, 12:55 PM

## 2022-08-10 NOTE — Plan of Care (Signed)
  Problem: Ischemic Stroke/TIA Tissue Perfusion: Goal: Complications of ischemic stroke/TIA will be minimized Outcome: Progressing   Problem: Coping: Goal: Will verbalize positive feelings about self Outcome: Progressing   Problem: Health Behavior/Discharge Planning: Goal: Goals will be collaboratively established with patient/family Outcome: Progressing   Problem: Safety: Goal: Ability to remain free from injury will improve Outcome: Progressing

## 2022-08-11 DIAGNOSIS — I63012 Cerebral infarction due to thrombosis of left vertebral artery: Secondary | ICD-10-CM | POA: Diagnosis not present

## 2022-08-11 LAB — BASIC METABOLIC PANEL
Anion gap: 10 (ref 5–15)
BUN: 23 mg/dL (ref 8–23)
CO2: 20 mmol/L — ABNORMAL LOW (ref 22–32)
Calcium: 8.4 mg/dL — ABNORMAL LOW (ref 8.9–10.3)
Chloride: 108 mmol/L (ref 98–111)
Creatinine, Ser: 1.61 mg/dL — ABNORMAL HIGH (ref 0.44–1.00)
GFR, Estimated: 33 mL/min — ABNORMAL LOW (ref >60)
Glucose, Bld: 136 mg/dL — ABNORMAL HIGH (ref 70–99)
Potassium: 3.7 mmol/L (ref 3.5–5.1)
Sodium: 138 mmol/L (ref 135–145)

## 2022-08-11 LAB — GLUCOSE, CAPILLARY
Glucose-Capillary: 116 mg/dL — ABNORMAL HIGH (ref 70–99)
Glucose-Capillary: 217 mg/dL — ABNORMAL HIGH (ref 70–99)
Glucose-Capillary: 159 mg/dL — ABNORMAL HIGH (ref 70–99)
Glucose-Capillary: 209 mg/dL — ABNORMAL HIGH (ref 70–99)

## 2022-08-11 LAB — MAGNESIUM: Magnesium: 2.1 mg/dL (ref 1.7–2.4)

## 2022-08-11 MED ORDER — ORAL CARE MOUTH RINSE: 15.0000 mL | OROMUCOSAL | Status: DC | PRN

## 2022-08-11 MED ORDER — ORAL CARE MOUTH RINSE
15.0000 mL | OROMUCOSAL | Status: DC
  Administered 2022-08-11 – 2022-08-13 (×6): 15 mL via OROMUCOSAL

## 2022-08-11 NOTE — Progress Notes (Signed)
Physical Therapy Treatment Patient Details Name: [name] MRN: [id number] DOB: 1946/05/11 Today's Date: 08/11/2022   History of Present Illness [name] is a 77 yr old female admitted to the hospital with slurred speech and difficulty swallowing. A MRI of the brain revealed the following: 1. 4 mm acute left medullary infarct.  2. Progressive chronic small vessel ischemic disease since 2019 with  multiple chronic infarcts as above. PMH: CVA with L sided deficits, HTN, HLD, DM II, CKD IV, arthritis, macular degeneration    PT Comments    3 attempts to see pt, unable d/t pt care/hygiene;  Pt progressing slowly this session, somewhat sleepy however arouses easily, conversant with PT and tech.  Pt +2 max assist for basic functional mobility tasks. Fatigues quickly. Will benefit from continue therapies   Recommendations for follow up therapy are one component of a multi-disciplinary discharge planning process, led by the attending physician.  Recommendations may be updated based on patient status, additional functional criteria and insurance authorization.  Follow Up Recommendations  Acute inpatient rehab (3hours/day)     Assistance Recommended at Discharge Frequent or constant Supervision/Assistance  Patient can return home with the following Two people to help with walking and/or transfers;Two people to help with bathing/dressing/bathroom;Help with stairs or ramp for entrance;Assist for transportation;Assistance with cooking/housework   Equipment Recommendations  None recommended by PT    Recommendations for Other Services       Precautions / Restrictions Precautions Precautions: Fall Precaution Comments: L HEMI CVA 3 years ago Required Braces or Orthoses: Other Brace Other Brace: R ankle stabilizing brace Restrictions Weight Bearing Restrictions: No     Mobility  Bed Mobility Overal bed mobility: Needs Assistance Bed Mobility: Supine to Sit     Supine to sit: Max  assist, +2 for physical assistance, +2 for safety/equipment     General bed mobility comments: assist for trunk and LEs; incr time, pt attempted to progress LEs off bed with cues    Transfers Overall transfer level: Needs assistance Equipment used: Rolling walker (2 wheels), 2 person hand held assist Transfers: Sit to/from Stand, Bed to chair/wheelchair/BSC Sit to Stand: Mod assist, Max assist, +2 physical assistance, +2 safety/equipment Stand pivot transfers: Max assist, +2 physical assistance, +2 safety/equipment         General transfer comment: STS x4; difficulty coming to stand with heavy L lateral lean; multi-modal cues for trunk extension, mildling and to incr BOS; unable to grasp RW with L: hand and unable to push through RW with R UE; completed squat opivot with bil HHA, PT and tech blocking bil LEs to prevent fwd translation of LEs    Ambulation/Gait                   Stairs             Wheelchair Mobility    Modified Rankin (Stroke Patients Only)       Balance                                            Cognition Arousal/Alertness: Awake/alert Behavior During Therapy: WFL for tasks assessed/performed Overall Cognitive Status: Within Functional Limits for tasks assessed                                 General Comments:  slightly sleepy but arouses easily        Exercises      General Comments General comments (skin integrity, edema, etc.): positioned wtih pillows for comfort/pressure relief, rolled wash cloth to L hand palmar surface      Pertinent Vitals/Pain Pain Assessment Pain Assessment: No/denies pain    Home Living                          Prior Function            PT Goals (current goals can now be found in the care plan section) Acute Rehab PT Goals Patient Stated Goal: Get stronger PT Goal Formulation: With patient/family Time For Goal Achievement: 08/22/22 Potential to  Achieve Goals: Good Progress towards PT goals: Progressing toward goals (slowly)    Frequency    Min 4X/week      PT Plan Current plan remains appropriate    Co-evaluation              AM-PAC PT "6 Clicks" Mobility   Outcome Measure  Help needed turning from your back to your side while in a flat bed without using bedrails?: Total Help needed moving from lying on your back to sitting on the side of a flat bed without using bedrails?: Total Help needed moving to and from a bed to a chair (including a wheelchair)?: Total Help needed standing up from a chair using your arms (e.g., wheelchair or bedside chair)?: Total Help needed to walk in hospital room?: Total Help needed climbing 3-5 steps with a railing? : Total 6 Click Score: 6    End of Session Equipment Utilized During Treatment: Gait belt Activity Tolerance: Patient limited by fatigue Patient left: in chair;with call bell/phone within reach;with chair alarm set;with family/visitor present Nurse Communication: Mobility status PT Visit Diagnosis: Unsteadiness on feet (R26.81);Muscle weakness (generalized) (M62.81);Difficulty in walking, not elsewhere classified (R26.2);Hemiplegia and hemiparesis Hemiplegia - Right/Left: Left Hemiplegia - dominant/non-dominant: Non-dominant     Time: 0932-3557 PT Time Calculation (min) (ACUTE ONLY): 25 min  Charges:  $Therapeutic Activity: 23-37 mins                     Baxter Flattery, PT  Acute Rehab Dept [facility]) [phone]  [facility] Weekend Pager [facility] only)  [phone]  08/11/2022    [facility] 08/11/2022, 2:49 PM

## 2022-08-11 NOTE — Progress Notes (Signed)
OT Cancellation Note  Patient Details Name: [name] MRN: [id number] DOB: 1946-06-16   Cancelled Treatment:    Reason Eval/Treat Not Completed: Fatigue/lethargy limiting ability to participate Patient just finished working with PT with increased fatigue. Family in room asking for therapy to check back tomorrow AM. OT to continue to follow [name], MS Acute Rehabilitation Department Office# [phone]  08/11/2022, 3:14 PM

## 2022-08-11 NOTE — Progress Notes (Signed)
08/11/22 1451  PT Visit Information  Last PT Received On 08/11/22  Assistance Needed +2  Pt scooting out of chair toward foot plate after sitting for ~10 minutes; called to room by RN for assistance; see below for mobility.    History of Present Illness Mrs. Poster is a 77 yr old female admitted to the hospital with slurred speech and difficulty swallowing. A MRI of the brain revealed the following: 1. 4 mm acute left medullary infarct.  2. Progressive chronic small vessel ischemic disease since 2019 with  multiple chronic infarcts as above. PMH: CVA with L sided deficits, HTN, HLD, DM II, CKD IV, arthritis, macular degeneration  Subjective Data  Patient Stated Goal Get stronger  Precautions  Precautions Fall  Precaution Comments L HEMI CVA 3 years ago  Required Braces or Orthoses Other Brace  Other Brace R ankle stabilizing brace  Restrictions  Weight Bearing Restrictions No  Cognition  Arousal/Alertness Awake/alert  Behavior During Therapy WFL for tasks assessed/performed  Overall Cognitive Status Within Functional Limits for tasks assessed  General Comments slightly sleepy but arouses easily  Bed Mobility  Overal bed mobility Needs Assistance  Sit to supine Total assist;+2 for physical assistance;+2 for safety/equipment  General bed mobility comments assist to control descent of trunk and elevate LEs on to bed  Transfers  Overall transfer level Needs assistance  Bed to/from chair/wheelchair/BSC transfer type: Lateral/scoot transfer   Lateral/Scoot Transfers +2 physical assistance;+2 safety/equipment;Total assist  General transfer comment bed pad used to scoot pt back to bed +2 total assist (pt was scooting toward foot plate after 10 minutes in recliner) limited pt input despite cues for anterior wt shift/midline; pt states she is tired  PT - End of Session  Activity Tolerance Patient limited by fatigue  Patient left with call bell/phone within reach;with family/visitor  present;in bed;with bed alarm set  Nurse Communication Mobility status   PT - Assessment/Plan  PT Plan Current plan remains appropriate  PT Visit Diagnosis Unsteadiness on feet (R26.81);Muscle weakness (generalized) (M62.81);Difficulty in walking, not elsewhere classified (R26.2);Hemiplegia and hemiparesis  Hemiplegia - Right/Left Left  Hemiplegia - dominant/non-dominant Non-dominant  PT Frequency (ACUTE ONLY) Min 4X/week  Follow Up Recommendations Acute inpatient rehab (3hours/day)  Assistance recommended at discharge Frequent or constant Supervision/Assistance  Patient can return home with the following Two people to help with walking and/or transfers;Two people to help with bathing/dressing/bathroom;Help with stairs or ramp for entrance;Assist for transportation;Assistance with cooking/housework  PT equipment None recommended by PT  AM-PAC PT "6 Clicks" Mobility Outcome Measure (Version 2)  Help needed turning from your back to your side while in a flat bed without using bedrails? 1  Help needed moving from lying on your back to sitting on the side of a flat bed without using bedrails? 1  Help needed moving to and from a bed to a chair (including a wheelchair)? 1  Help needed standing up from a chair using your arms (e.g., wheelchair or bedside chair)? 1  Help needed to walk in hospital room? 1  Help needed climbing 3-5 steps with a railing?  1  6 Click Score 6  Consider Recommendation of Discharge To: CIR/SNF/LTACH  PT Goal Progression  Progress towards PT goals Progressing toward goals (slowly)  Acute Rehab PT Goals  PT Goal Formulation With patient/family  Time For Goal Achievement 08/22/22  Potential to Achieve Goals Good  PT Time Calculation  PT Start Time (ACUTE ONLY) 1437  PT Stop Time (ACUTE ONLY) 1447  PT  Time Calculation (min) (ACUTE ONLY) 10 min  PT General Charges  $$ ACUTE PT VISIT 1 Visit  PT Treatments  $Therapeutic Activity 8-22 mins

## 2022-08-11 NOTE — Progress Notes (Signed)
PROGRESS NOTE    [name]  KTG:[id number] DOB: April 26, 1946 DOA: 08/07/2022 PCP: Lauree Chandler, NP   Brief Narrative:  77 year old with history of CVA in 2019, HTN, HLD, DM 2, CKD stage 3aI, recent UTI brought to the hospital for increasing slurred speech and some difficulty swallowing.  At baseline patient does have left-sided weakness.  About a week ago she came to the ER with similar complaints, CT of the head was negative and UA showed possible UTI therefore discharged on antibiotics.  During this admission CT of the head was negative but MRI showed acute left lateral medullary infarct.  Neurology team was consulted.  It was decided for patient to be on aspirin and Plavix for 3 weeks followed by aspirin alone.  PT/OT recommended CIR therefore inpatient rehab team was consulted.  Due to dysphagia she was seen by speech and swallow therapy and deemed to be a moderate risk therefore their service will continue working with the patient.   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  PT/OT recommended CIR, inpatient rehab team consulted. - A1c-7.8, lipid panel-100 - Speech and swallow elevation performed and she is deemed to be moderate risk for aspiration.  Speech therapist will continue working with the patient.  Acute urinary retention - Foley removed 2/3 but failed voiding trial therefore we will have to replace it today.  Renal stone - Nonobstructive.  Renal function appears to be normal.  Advised oral hydration  Hyperlipidemia -Lipid panel LDL 100 - On  home Zetia and Tricor  Essential hypertension, uncontrolled - On losartan.  Add p.o. hydralazine  Diabetes mellitus type 2 -Sliding scale and Accu-Cheks.  Previously patient has declined initiation of any diabetic medications per documentation  CKD 3a -Creatinine currently at baseline of 1.6  Hypokalemia -Replete as needed  Recent urinary tract infection - This was recently diagnosed outpatient and prescribed Keflex but patient developed allergic reaction to it?Marland Kitchen  At this time she remains asymptomatic therefore no further treatment  DVT prophylaxis: enoxaparin (LOVENOX) injection 30 mg Start: 08/08/22 1400 SCD's Start: 08/07/22 1530 Code Status: Full code Family Communication: None at the side  Status is: Inpatient Remains inpatient appropriate because: Plans for CIR when bed available Subjective: Feeling okay no complaints  Examination: Constitutional: Not in acute distress.  Elderly frail Respiratory: Clear to auscultation bilaterally Cardiovascular: Normal sinus rhythm, no rubs Abdomen: Nontender nondistended good bowel sounds Musculoskeletal: No edema noted Skin: No rashes seen Neurologic: CN 2-12 grossly intact.  And nonfocal.  Right upper and lower extremity strength 4/5. Psychiatric: Normal judgment and insight. Alert and oriented x 3. Normal mood.  Normal judgment and insight. Alert and oriented x 3. Normal mood. Objective: Vitals:   08/10/22 2226 08/11/22 0043 08/11/22 0551 08/11/22 0825  BP: (!) 231/73 (!) 142/85 (!) 148/89 (!) 148/86  Pulse: 72 71 72   Resp: 18     Temp: 97.9 F (36.6 C)  97.6 F (36.4 C)   TempSrc: Oral  Oral   SpO2: 99%  100%   Weight:      Height:  Intake/Output Summary (Last 24 hours) at 08/11/2022 1037 Last data filed at 08/11/2022 0842 Gross per 24 hour  Intake 540 ml  Output 450 ml  Net 90 ml   Filed Weights   08/07/22 1532  Weight: 63.4 kg     Data Reviewed:   CBC: Recent Labs  Lab 08/07/22 0822 08/10/22 0459   WBC 13.7* 8.4  NEUTROABS 10.8*  --   HGB 15.0 12.6  HCT 46.8* 39.7  MCV 83.1 84.5  PLT 363 409   Basic Metabolic Panel: Recent Labs  Lab 08/07/22 0822 08/09/22 0418 08/10/22 0459 08/11/22 0425  NA 144 144 140 138  K 3.4* 3.2* 3.8 3.7  CL 110 115* 113* 108  CO2 21* 19* 21* 20*  GLUCOSE 190* 98 115* 136*  BUN 38* 27* 22 23  CREATININE 1.63* 1.51* 1.65* 1.61*  CALCIUM 8.9 8.4* 8.4* 8.4*  MG 2.2 2.0 2.4 2.1   GFR: Estimated Creatinine Clearance: 25.6 mL/min (A) (by C-G formula based on SCr of 1.61 mg/dL (H)). Liver Function Tests: Recent Labs  Lab 08/07/22 0822  AST 20  ALT 23  ALKPHOS 114  BILITOT 0.9  PROT 7.0  ALBUMIN 3.4*   No results for input(s): "LIPASE", "AMYLASE" in the last 168 hours. No results for input(s): "AMMONIA" in the last 168 hours. Coagulation Profile: No results for input(s): "INR", "PROTIME" in the last 168 hours. Cardiac Enzymes: No results for input(s): "CKTOTAL", "CKMB", "CKMBINDEX", "TROPONINI" in the last 168 hours. BNP (last 3 results) No results for input(s): "PROBNP" in the last 8760 hours. HbA1C: No results for input(s): "HGBA1C" in the last 72 hours.  CBG: Recent Labs  Lab 08/10/22 0737 08/10/22 1129 08/10/22 1735 08/10/22 2223 08/11/22 0754  GLUCAP 115* 170* 135* 135* 116*   Lipid Profile: No results for input(s): "CHOL", "HDL", "LDLCALC", "TRIG", "CHOLHDL", "LDLDIRECT" in the last 72 hours.  Thyroid Function Tests: No results for input(s): "TSH", "T4TOTAL", "FREET4", "T3FREE", "THYROIDAB" in the last 72 hours. Anemia Panel: No results for input(s): "VITAMINB12", "FOLATE", "FERRITIN", "TIBC", "IRON", "RETICCTPCT" in the last 72 hours. Sepsis Labs: No results for input(s): "PROCALCITON", "LATICACIDVEN" in the last 168 hours.  Recent Results (from the past 240 hour(s))  Resp panel by RT-PCR (RSV, Flu A&B, Covid) Anterior Nasal Swab     Status: None   Collection Time: 08/07/22  9:11 AM   Specimen: Anterior Nasal Swab   Result Value Ref Range Status   SARS Coronavirus 2 by RT PCR NEGATIVE NEGATIVE Final    Comment: (NOTE) SARS-CoV-2 target nucleic acids are NOT DETECTED.  The SARS-CoV-2 RNA is generally detectable in upper respiratory specimens during the acute phase of infection. The lowest concentration of SARS-CoV-2 viral copies this assay can detect is 138 copies/mL. A negative result does not preclude SARS-Cov-2 infection and should not be used as the sole basis for treatment or other patient management decisions. A negative result may occur with  improper specimen collection/handling, submission of specimen other than nasopharyngeal swab, presence of viral mutation(s) within the areas targeted by this assay, and inadequate number of viral copies(<138 copies/mL). A negative result must be combined with clinical observations, patient history, and epidemiological information. The expected result is Negative.  Fact Sheet for Patients:  EntrepreneurPulse.com.au  Fact Sheet for Healthcare Providers:  IncredibleEmployment.be  This test is no t yet approved or cleared by the [place] FDA and  has been authorized for detection and/or diagnosis of SARS-CoV-2 by FDA under an Emergency Use Authorization (EUA). This EUA will  remain  in effect (meaning this test can be used) for the duration of the COVID-19 declaration under Section 564(b)(1) of the Act, 21 U.S.C.section 360bbb-3(b)(1), unless the authorization is terminated  or revoked sooner.       Influenza A by PCR NEGATIVE NEGATIVE Final   Influenza B by PCR NEGATIVE NEGATIVE Final    Comment: (NOTE) The Xpert Xpress SARS-CoV-2/FLU/RSV plus assay is intended as an aid in the diagnosis of influenza from Nasopharyngeal swab specimens and should not be used as a sole basis for treatment. Nasal washings and aspirates are unacceptable for Xpert Xpress SARS-CoV-2/FLU/RSV testing.  Fact Sheet for  Patients: EntrepreneurPulse.com.au  Fact Sheet for Healthcare Providers: IncredibleEmployment.be  This test is not yet approved or cleared by the [place] FDA and has been authorized for detection and/or diagnosis of SARS-CoV-2 by FDA under an Emergency Use Authorization (EUA). This EUA will remain in effect (meaning this test can be used) for the duration of the COVID-19 declaration under Section 564(b)(1) of the Act, 21 U.S.C. section 360bbb-3(b)(1), unless the authorization is terminated or revoked.     Resp Syncytial Virus by PCR NEGATIVE NEGATIVE Final    Comment: (NOTE) Fact Sheet for Patients: EntrepreneurPulse.com.au  Fact Sheet for Healthcare Providers: IncredibleEmployment.be  This test is not yet approved or cleared by the [place] FDA and has been authorized for detection and/or diagnosis of SARS-CoV-2 by FDA under an Emergency Use Authorization (EUA). This EUA will remain in effect (meaning this test can be used) for the duration of the COVID-19 declaration under Section 564(b)(1) of the Act, 21 U.S.C. section 360bbb-3(b)(1), unless the authorization is terminated or revoked.  Performed at [facility], [place] [address] [place], [place] [address]          Radiology Studies: No results found.      Scheduled Meds:  aspirin EC  81 mg Oral Daily   Chlorhexidine Gluconate Cloth  6 each Topical Daily   clopidogrel  75 mg Oral Daily   enoxaparin (LOVENOX) injection  30 mg Subcutaneous Q24H   ezetimibe  10 mg Oral Daily   fenofibrate  160 mg Oral Daily   hydrALAZINE  25 mg Oral Q8H   insulin aspart  0-9 Units Subcutaneous TID WC   losartan  25 mg Oral Daily   mouth rinse  15 mL Mouth Rinse 4 times per day   polyethylene glycol  17 g Oral BID   sertraline  25 mg Oral Daily   Continuous Infusions:     LOS: 4 days   Time spent= 35 mins    Keniel Ralston  Arsenio Loader, MD [facility]  If 7PM-7AM, please contact night-coverage  08/11/2022, 10:37 AM

## 2022-08-12 DIAGNOSIS — I63012 Cerebral infarction due to thrombosis of left vertebral artery: Secondary | ICD-10-CM | POA: Diagnosis not present

## 2022-08-12 LAB — BASIC METABOLIC PANEL
Anion gap: 7 (ref 5–15)
BUN: 31 mg/dL — ABNORMAL HIGH (ref 8–23)
CO2: 19 mmol/L — ABNORMAL LOW (ref 22–32)
Calcium: 8.5 mg/dL — ABNORMAL LOW (ref 8.9–10.3)
Chloride: 114 mmol/L — ABNORMAL HIGH (ref 98–111)
Creatinine, Ser: 2.01 mg/dL — ABNORMAL HIGH (ref 0.44–1.00)
GFR, Estimated: 25 mL/min — ABNORMAL LOW (ref >60)
Glucose, Bld: 163 mg/dL — ABNORMAL HIGH (ref 70–99)
Potassium: 3.7 mmol/L (ref 3.5–5.1)
Sodium: 140 mmol/L (ref 135–145)

## 2022-08-12 LAB — GLUCOSE, CAPILLARY
Glucose-Capillary: 158 mg/dL — ABNORMAL HIGH (ref 70–99)
Glucose-Capillary: 179 mg/dL — ABNORMAL HIGH (ref 70–99)
Glucose-Capillary: 146 mg/dL — ABNORMAL HIGH (ref 70–99)
Glucose-Capillary: 191 mg/dL — ABNORMAL HIGH (ref 70–99)

## 2022-08-12 LAB — MAGNESIUM: Magnesium: 2.3 mg/dL (ref 1.7–2.4)

## 2022-08-12 MED ORDER — HYDRALAZINE HCL 50 MG PO TABS
50.0000 mg | ORAL_TABLET | Freq: Three times a day (TID) | ORAL | Status: DC
  Administered 2022-08-13: 50 mg via ORAL
  Filled 2022-08-12 (×2): qty 1

## 2022-08-12 MED ORDER — SODIUM CHLORIDE 0.9 % IV SOLN: INTRAVENOUS | Status: AC

## 2022-08-12 MED ORDER — METOPROLOL TARTRATE 5 MG/5ML IV SOLN: 5.0000 mg | INTRAVENOUS | Status: DC | PRN

## 2022-08-12 NOTE — Progress Notes (Addendum)
Inpatient Rehab Admissions Coordinator:    Received call from [facility] with [facility], the are offering peer to peer which Dr. Reesa Chew stated he would call. Updated clinicals faxed to [facility] as well, per [facility] request. Will continue to follow for insurance approval for potential CIR admission.   Rehab Admissons Coordinator [place], [place], [place] [phone]

## 2022-08-12 NOTE — TOC Progression Note (Signed)
Transition of Care [facility]) - Progression Note    Patient Details  Name: [name] MRN: [id number] Date of Birth: 1946-06-24  Transition of Care [facility] CM/SW Contact  Emmilia Sowder, Juliann Pulse, RN Phone Number: 08/12/2022, 10:04 AM  Clinical Narrative:CIR following-awaiting auth.       Expected Discharge Plan: IP Rehab Facility Barriers to Discharge: Insurance Authorization  Expected Discharge Plan and Services                                               Social Determinants of Health (SDOH) Interventions SDOH Screenings   Food Insecurity: No Food Insecurity (08/07/2022)  Housing: Low Risk  (08/07/2022)  Transportation Needs: No Transportation Needs (08/07/2022)  Utilities: Not At Risk (08/07/2022)  Depression (PHQ2-9): Low Risk  (08/28/2021)  Financial Resource Strain: Medium Risk (02/27/2018)  Physical Activity: Inactive (02/27/2018)  Social Connections: Somewhat Isolated (02/27/2018)  Stress: No Stress Concern Present (02/27/2018)  Tobacco Use: Low Risk  (08/07/2022)    Readmission Risk Interventions     No data to display

## 2022-08-12 NOTE — Progress Notes (Signed)
Occupational Therapy Treatment Patient Details Name: ALAJA GOLDINGER MRN: [id number] DOB: 12-21-1945 Today's Date: 08/12/2022   History of present illness Mrs. Virgen is a 77 yr old female admitted to the hospital with slurred speech and difficulty swallowing. A MRI of the brain revealed the following: 1. 4 mm acute left medullary infarct.  2. Progressive chronic small vessel ischemic disease since 2019 with  multiple chronic infarcts as above. PMH: CVA with L sided deficits, HTN, HLD, DM II, CKD IV, arthritis, macular degeneration   OT comments  Patient is motivated and making progress towards goals. Patient was able to engage in morning ADL tasks seated in chair with education to patient and daughter on increasing patients independence in ADLs. Patient verbalized understanding and reported she would try UB tasks and then ask for help. Daughter in room agreed to this as well. Patient would continue to benefit from skilled OT services at this time while admitted and after d/c to address noted deficits in order to improve overall safety and independence in ADLs.     Recommendations for follow up therapy are one component of a multi-disciplinary discharge planning process, led by the attending physician.  Recommendations may be updated based on patient status, additional functional criteria and insurance authorization.    Follow Up Recommendations  Acute inpatient rehab (3hours/day)     Assistance Recommended at Discharge Frequent or constant Supervision/Assistance  Patient can return home with the following  Help with stairs or ramp for entrance;Assist for transportation;Assistance with cooking/housework;Direct supervision/assist for medications management;A lot of help with bathing/dressing/bathroom;A lot of help with walking and/or transfers   Equipment Recommendations  None recommended by OT    Recommendations for Other Services      Precautions / Restrictions Precautions Precautions:  Fall Precaution Comments: L HEMI CVA 3 years ago Required Braces or Orthoses: Other Brace Other Brace: R ankle stabilizing brace Restrictions Weight Bearing Restrictions: No RLE Weight Bearing: Weight bearing as tolerated LLE Weight Bearing: Weight bearing as tolerated Other Position/Activity Restrictions: Old L sided weakness from a prior CVA from ~3 years ago. History of R abkle fracture ~1-1.5 yr ago       Mobility Bed Mobility               General bed mobility comments: patient was up in personal transport chair today. patient asked to remain in chair to eat berries             ADL either performed or assessed with clinical judgement   ADL Overall ADL's : Needs assistance/impaired     Grooming: Wash/dry hands;Wash/dry face;Oral care;Brushing hair;Set up;Minimal assistance Grooming Details (indicate cue type and reason): min A to set up for RUE to complete Upper Body Bathing: Minimal assistance Upper Body Bathing Details (indicate cue type and reason): to wash R arm and under breasts. Lower Body Bathing: Moderate assistance Lower Body Bathing Details (indicate cue type and reason): able to wash tops of thighs with RUE. Upper Body Dressing : Moderate assistance Upper Body Dressing Details (indicate cue type and reason): to don gown         Cognition Arousal/Alertness: Awake/alert Behavior During Therapy: WFL for tasks assessed/performed Overall Cognitive Status: Within Functional Limits for tasks assessed       General Comments: patient was alert and cooperative today.                   Pertinent Vitals/ Pain       Pain Assessment Pain Assessment: No/denies pain  Frequency  Min 2X/week        Progress Toward Goals  OT Goals(current goals can now be found in the care plan section)  Progress towards OT goals: Progressing toward goals     Plan Discharge plan remains appropriate       AM-PAC OT "6 Clicks" Daily Activity      Outcome Measure   Help from another person eating meals?: A Little Help from another person taking care of personal grooming?: A Little Help from another person toileting, which includes using toliet, bedpan, or urinal?: A Lot Help from another person bathing (including washing, rinsing, drying)?: A Lot Help from another person to put on and taking off regular upper body clothing?: A Lot Help from another person to put on and taking off regular lower body clothing?: A Lot 6 Click Score: 14    End of Session    OT Visit Diagnosis: Unsteadiness on feet (R26.81);Muscle weakness (generalized) (M62.81)   Activity Tolerance Patient tolerated treatment well   Patient Left in chair;with call bell/phone within reach (personal transport chair)   Nurse Communication Mobility status        Time: [phone] OT Time Calculation (min): 25 min  Charges: OT General Charges $OT Visit: 1 Visit OT Treatments $Self Care/Home Management : 23-37 mins  [name], MS Acute Rehabilitation Department Office# [phone]   Willa Rough 08/12/2022, 1:40 PM

## 2022-08-12 NOTE — Progress Notes (Addendum)
PROGRESS NOTE    [name]  RWE:[id number] DOB: 1946-07-04 DOA: 08/07/2022 PCP: Lauree Chandler, NP   Brief Narrative:  77 year old with history of CVA in 2019, HTN, HLD, DM 2, CKD stage 3aI, recent UTI brought to the hospital for increasing slurred speech and some difficulty swallowing.  At baseline patient does have left-sided weakness.  About a week ago she came to the ER with similar complaints, CT of the head was negative and UA showed possible UTI therefore discharged on antibiotics.  During this admission CT of the head was negative but MRI showed acute left lateral medullary infarct.  Neurology team was consulted.  It was decided for patient to be on aspirin and Plavix for 3 weeks followed by aspirin alone.  PT/OT recommended CIR therefore inpatient rehab team was consulted.  Due to dysphagia she was seen by speech and swallow therapy and deemed to be a moderate risk therefore their service will continue working with the patient.  Currently awaiting CIR placement.   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  PT/OT recommended CIR, inpatient rehab team consulted. - A1c-7.8, lipid panel-100 - Speech and swallow elevation performed and she is deemed to be moderate risk for aspiration.  Speech therapist will continue working with the patient.  Acute urinary retention - Foley removed 2/3 but failed voiding trial therefore replaced 2/4 6  AKI on CKD 3a -Creatinine currently at baseline of 1.6.  Creatinine up to 2.0, suspect from urinary retention.  Foley  catheter should help, will give gentle hydration.  Hold off on losartan  Renal stone - Nonobstructive.  Renal function appears to be normal.  Advised oral hydration  Hyperlipidemia -Lipid panel LDL 100 - On home Zetia and Tricor  Essential hypertension, uncontrolled - Increase p.o. hydralazine.  Hold losartan in the setting of AKI.  IV as needed ordered  Diabetes mellitus type 2 -Sliding scale and Accu-Cheks.  Previously patient has declined initiation of any diabetic medications per documentation   Hypokalemia -Replete as needed  Recent urinary tract infection - This was recently diagnosed outpatient and prescribed Keflex but patient developed allergic reaction to it?Marland Kitchen  At this time she remains asymptomatic therefore no further treatment  DVT prophylaxis: enoxaparin (LOVENOX) injection 30 mg Start: 08/08/22 1400 SCD's Start: 08/07/22 1530 Code Status: Full code Family Communication: Daughter at bedside  Status is: Inpatient Remains inpatient appropriate because: Plans for CIR when bed available  Subjective: Seen and examined at bedside.  No complaints this morning.  Already worked a little bit with therapy.  Examination: Constitutional: Not in acute distress.  Elderly frail Respiratory: Clear to auscultation bilaterally Cardiovascular: Normal sinus rhythm, no rubs Abdomen: Nontender nondistended good bowel sounds Musculoskeletal: No edema noted Skin: No rashes seen Neurologic: CN 2-12 grossly intact.  And nonfocal.  Right upper and lower extremity strength 4/5 Psychiatric: Normal judgment and insight. Alert and oriented x 3.  Foley catheter in place  Objective: Vitals:   08/11/22 0825 08/11/22 1256 08/11/22 2123 08/12/22 0529  BP: (!) 148/86 (!) 142/84 (!) 159/55 (!) 187/72  Pulse:  74 70 72  Resp:  16 16 14   Temp:  98.1 F (36.7 C) 98 F (36.7 C) 98 F (36.7 C)  TempSrc:  Oral Oral Oral  SpO2:  99% 98% 98%  Weight:      Height:        Intake/Output  Summary (Last 24 hours) at 08/12/2022 0743 Last data filed at 08/12/2022 0500 Gross per 24 hour  Intake 360 ml  Output 850 ml  Net -490 ml   Filed Weights   08/07/22 1532  Weight: 63.4 kg     Data Reviewed:   CBC: Recent Labs  Lab 08/07/22 0822 08/10/22 0459  WBC 13.7* 8.4  NEUTROABS 10.8*  --   HGB 15.0 12.6  HCT 46.8* 39.7  MCV 83.1 84.5  PLT 363 494   Basic Metabolic Panel: Recent Labs  Lab 08/07/22 0822 08/09/22 0418 08/10/22 0459 08/11/22 0425 08/12/22 0427  NA 144 144 140 138 140  K 3.4* 3.2* 3.8 3.7 3.7  CL 110 115* 113* 108 114*  CO2 21* 19* 21* 20* 19*  GLUCOSE 190* 98 115* 136* 163*  BUN 38* 27* 22 23 31*  CREATININE 1.63* 1.51* 1.65* 1.61* 2.01*  CALCIUM 8.9 8.4* 8.4* 8.4* 8.5*  MG 2.2 2.0 2.4 2.1 2.3   GFR: Estimated Creatinine Clearance: 20.5 mL/min (A) (by C-G formula based on SCr of 2.01 mg/dL (H)). Liver Function Tests: Recent Labs  Lab 08/07/22 0822  AST 20  ALT 23  ALKPHOS 114  BILITOT 0.9  PROT 7.0  ALBUMIN 3.4*   No results for input(s): "LIPASE", "AMYLASE" in the last 168 hours. No results for input(s): "AMMONIA" in the last 168 hours. Coagulation Profile: No results for input(s): "INR", "PROTIME" in the last 168 hours. Cardiac Enzymes: No results for input(s): "CKTOTAL", "CKMB", "CKMBINDEX", "TROPONINI" in the last 168 hours. BNP (last 3 results) No results for input(s): "PROBNP" in the last 8760 hours. HbA1C: No results for input(s): "HGBA1C" in the last 72 hours.  CBG: Recent Labs  Lab 08/11/22 0754 08/11/22 1139 08/11/22 1646 08/11/22 2111 08/12/22 0730  GLUCAP 116* 217* 159* 209* 158*   Lipid Profile: No results for input(s): "CHOL", "HDL", "LDLCALC", "TRIG", "CHOLHDL", "LDLDIRECT" in the last 72 hours.  Thyroid Function Tests: No results for input(s): "TSH", "T4TOTAL", "FREET4", "T3FREE", "THYROIDAB" in the last 72 hours. Anemia Panel: No results for input(s): "VITAMINB12", "FOLATE", "FERRITIN", "TIBC",  "IRON", "RETICCTPCT" in the last 72 hours. Sepsis Labs: No results for input(s): "PROCALCITON", "LATICACIDVEN" in the last 168 hours.  Recent Results (from the past 240 hour(s))  Resp panel by RT-PCR (RSV, Flu A&B, Covid) Anterior Nasal Swab     Status: None   Collection Time: 08/07/22  9:11 AM   Specimen: Anterior Nasal Swab  Result Value Ref Range Status   SARS Coronavirus 2 by RT PCR NEGATIVE NEGATIVE Final    Comment: (NOTE) SARS-CoV-2 target nucleic acids are NOT DETECTED.  The SARS-CoV-2 RNA is generally detectable in upper respiratory specimens during the acute phase of infection. The lowest concentration of SARS-CoV-2 viral copies this assay can detect is 138 copies/mL. A negative result does not preclude SARS-Cov-2 infection and should not be used as the sole basis for treatment or other patient management decisions. A negative result may occur with  improper specimen collection/handling, submission of specimen other than nasopharyngeal swab, presence of viral mutation(s) within the areas targeted by this assay, and inadequate number of viral copies(<138 copies/mL). A negative result must be combined with  clinical observations, patient history, and epidemiological information. The expected result is Negative.  Fact Sheet for Patients:  EntrepreneurPulse.com.au  Fact Sheet for Healthcare Providers:  IncredibleEmployment.be  This test is no t yet approved or cleared by the [place] FDA and  has been authorized for detection and/or diagnosis of SARS-CoV-2 by FDA under an Emergency Use Authorization (EUA). This EUA will remain  in effect (meaning this test can be used) for the duration of the COVID-19 declaration under Section 564(b)(1) of the Act, 21 U.S.C.section 360bbb-3(b)(1), unless the authorization is terminated  or revoked sooner.       Influenza A by PCR NEGATIVE NEGATIVE Final   Influenza B by PCR NEGATIVE NEGATIVE  Final    Comment: (NOTE) The Xpert Xpress SARS-CoV-2/FLU/RSV plus assay is intended as an aid in the diagnosis of influenza from Nasopharyngeal swab specimens and should not be used as a sole basis for treatment. Nasal washings and aspirates are unacceptable for Xpert Xpress SARS-CoV-2/FLU/RSV testing.  Fact Sheet for Patients: EntrepreneurPulse.com.au  Fact Sheet for Healthcare Providers: IncredibleEmployment.be  This test is not yet approved or cleared by the [place] FDA and has been authorized for detection and/or diagnosis of SARS-CoV-2 by FDA under an Emergency Use Authorization (EUA). This EUA will remain in effect (meaning this test can be used) for the duration of the COVID-19 declaration under Section 564(b)(1) of the Act, 21 U.S.C. section 360bbb-3(b)(1), unless the authorization is terminated or revoked.     Resp Syncytial Virus by PCR NEGATIVE NEGATIVE Final    Comment: (NOTE) Fact Sheet for Patients: EntrepreneurPulse.com.au  Fact Sheet for Healthcare Providers: IncredibleEmployment.be  This test is not yet approved or cleared by the [place] FDA and has been authorized for detection and/or diagnosis of SARS-CoV-2 by FDA under an Emergency Use Authorization (EUA). This EUA will remain in effect (meaning this test can be used) for the duration of the COVID-19 declaration under Section 564(b)(1) of the Act, 21 U.S.C. section 360bbb-3(b)(1), unless the authorization is terminated or revoked.  Performed at [facility], [place] [address], [place], [place] [address]          Radiology Studies: No results found.      Scheduled Meds:  aspirin EC  81 mg Oral Daily   Chlorhexidine Gluconate Cloth  6 each Topical Daily   clopidogrel  75 mg Oral Daily   enoxaparin (LOVENOX) injection  30 mg Subcutaneous Q24H   ezetimibe  10 mg Oral Daily   fenofibrate  160 mg  Oral Daily   hydrALAZINE  25 mg Oral Q8H   insulin aspart  0-9 Units Subcutaneous TID WC   mouth rinse  15 mL Mouth Rinse 4 times per day   polyethylene glycol  17 g Oral BID   sertraline  25 mg Oral Daily   Continuous Infusions:  sodium chloride        LOS: 5 days   Time spent= 35 mins    Jolanta Cabeza Arsenio Loader, MD [facility]  If 7PM-7AM, please contact night-coverage  08/12/2022, 7:43 AM

## 2022-08-12 NOTE — Progress Notes (Addendum)
Inpatient Rehab Admissions Coordinator:   Received approval from [place] at [facility] for [place] admission. There is not a bed available today, but hopefully patient will be able to admit in the next 1-2 days pending medical readiness for discharge and bed availability. Will continue to follow. Called daughter,[name] and let her know.   Rehab Admissons Coordinator [place], [place], [place] [phone]

## 2022-08-12 NOTE — Progress Notes (Addendum)
Speech Language Pathology Treatment: Dysphagia  Patient Details Name: [name] MRN: [id number] DOB: 11/11/1945 Today's Date: 08/12/2022 Time: 1045-1100 SLP Time Calculation (min) (ACUTE ONLY): 15 min  Assessment / Plan / Recommendation Clinical Impression  Patient seen by SLP for skilled treatment focused on dysphagia goals. Patient was awake and alert and daughter present in room as well. Daughter transferred patient from bed to transport chair. (Daughter brought from home) She commented that patient was doing significantly more of the work when transferring than she had been. Daughter also stated that patient "kept all three meals down" yesterday and patient herself told SLP that she enjoys the food. Daughter observed patient eating some crackers that were in the room and she did not notice any significant difficulty and so she is requesting SLP determine if patient able to advance with solid textures. SLP observed patient with straw sips of thin liquids (cola) and although swallow initiation continues to be delayed, appears to be more timely than prior therapy notes indicated. With graham crackers, patient exhibited delays anterior to posterior transit as well as decreased strength in mastication. No immediate overt s/s aspiration or penetration but patient with one incident of delayed cough. SLP discussed advanced solids textures (Dys 2, Dys 3) with daughter and plan is for trial of Dys 3 (mechanical soft) solids and SLP to return to observe with at least part of this meal before determining if patient ready for upgrade. Update: SLP was not able to see patient when lunch tray came but did stop by to speak briefly with patient and daughter. Patient was in transport chair and had just recently finished upgraded meal tray (spaghetti). Her daughter said she appeared to tolerate mechanical soft solids very well and had only one instance of coughing. Daughter also told SLP she was supervising patient  closely and focusing on slow PO intake. Patient is slow to masticate solids but if not supervised, will attempt to put more food in mouth before swallowing previous bite. SLP spoke with RN and updated swallow safety sign in room. Plan to continue with Dys 3 solids, nectar thick liquids and will follow patient for toleration and determine if needing repeat MBS prior to upgrading liquids.    HPI HPI: 77 yo female adm to [facility] with several days of lethargy - treated for UTI, readmitted after found to have drooling, left sided weakness and dysphagia.  Medical history significant of CVA w/ left side residuals, HTN, HLD, DM2, CKD4. 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   Pt passed [place], but noted to have ongoing symptoms of dysphagia.  Prior MOCA score 29/30 when pt had CVA 2019.  Follow up for dysphagia treatment indicated.      SLP Plan  Continue with current plan of care      Recommendations for follow up therapy are one component of a multi-disciplinary discharge planning process, led by the attending physician.  Recommendations may be updated based on patient status, additional functional criteria and insurance authorization.    Recommendations  Diet recommendations: Other(comment) (trial of Dys 3 at lunch) Liquids provided via: Cup;Straw Medication Administration: Crushed with puree Supervision: Patient able to self feed;Full supervision/cueing for compensatory strategies Compensations: Slow rate;Small sips/bites;Follow solids with liquid Postural Changes and/or Swallow Maneuvers: Seated upright 90 degrees;Upright 30-60 min after meal  Oral Care Recommendations: Oral care BID;Oral care before and after PO Follow Up Recommendations: Acute inpatient rehab (3hours/day) Assistance recommended at discharge: Frequent or constant  Supervision/Assistance SLP Visit Diagnosis: Dysphagia, oropharyngeal phase (R13.12);Dysphagia, unspecified (R13.10) Plan: Continue with current plan of care          Sonia Baller, MA, CCC-SLP Speech Therapy

## 2022-08-13 ENCOUNTER — Ambulatory Visit: Payer: Medicare PPO | Admitting: Rehabilitation

## 2022-08-13 ENCOUNTER — Inpatient Hospital Stay (HOSPITAL_COMMUNITY)
Admission: RE | Admit: 2022-08-13 | Discharge: 2022-09-04 | DRG: 057 | Disposition: A | Discharge status: Home Health | Payer: Medicare PPO | Source: Other Acute Inpatient Hospital | Attending: Physical Medicine and Rehabilitation | Admitting: Physical Medicine and Rehabilitation

## 2022-08-13 ENCOUNTER — Encounter (HOSPITAL_COMMUNITY): Payer: Self-pay | Admitting: Physical Medicine and Rehabilitation

## 2022-08-13 ENCOUNTER — Other Ambulatory Visit: Payer: Self-pay

## 2022-08-13 DIAGNOSIS — Z79899 Other long term (current) drug therapy: Secondary | ICD-10-CM

## 2022-08-13 DIAGNOSIS — I69354 Hemiplegia and hemiparesis following cerebral infarction affecting left non-dominant side: Secondary | ICD-10-CM | POA: Diagnosis not present

## 2022-08-13 DIAGNOSIS — Z515 Encounter for palliative care: Secondary | ICD-10-CM | POA: Diagnosis not present

## 2022-08-13 DIAGNOSIS — Z7902 Long term (current) use of antithrombotics/antiplatelets: Secondary | ICD-10-CM

## 2022-08-13 DIAGNOSIS — R339 Retention of urine, unspecified: Secondary | ICD-10-CM | POA: Diagnosis present

## 2022-08-13 DIAGNOSIS — N183 Chronic kidney disease, stage 3 unspecified: Secondary | ICD-10-CM | POA: Diagnosis not present

## 2022-08-13 DIAGNOSIS — R5381 Other malaise: Secondary | ICD-10-CM | POA: Diagnosis present

## 2022-08-13 DIAGNOSIS — R54 Age-related physical debility: Secondary | ICD-10-CM | POA: Diagnosis not present

## 2022-08-13 DIAGNOSIS — I129 Hypertensive chronic kidney disease with stage 1 through stage 4 chronic kidney disease, or unspecified chronic kidney disease: Secondary | ICD-10-CM | POA: Diagnosis present

## 2022-08-13 DIAGNOSIS — I69392 Facial weakness following cerebral infarction: Secondary | ICD-10-CM | POA: Diagnosis not present

## 2022-08-13 DIAGNOSIS — E1122 Type 2 diabetes mellitus with diabetic chronic kidney disease: Secondary | ICD-10-CM | POA: Diagnosis present

## 2022-08-13 DIAGNOSIS — E1169 Type 2 diabetes mellitus with other specified complication: Secondary | ICD-10-CM | POA: Diagnosis not present

## 2022-08-13 DIAGNOSIS — I69398 Other sequelae of cerebral infarction: Secondary | ICD-10-CM

## 2022-08-13 DIAGNOSIS — Z66 Do not resuscitate: Secondary | ICD-10-CM | POA: Diagnosis not present

## 2022-08-13 DIAGNOSIS — Z888 Allergy status to other drugs, medicaments and biological substances status: Secondary | ICD-10-CM

## 2022-08-13 DIAGNOSIS — G8194 Hemiplegia, unspecified affecting left nondominant side: Secondary | ICD-10-CM | POA: Diagnosis present

## 2022-08-13 DIAGNOSIS — I69322 Dysarthria following cerebral infarction: Secondary | ICD-10-CM | POA: Diagnosis not present

## 2022-08-13 DIAGNOSIS — M25371 Other instability, right ankle: Secondary | ICD-10-CM | POA: Diagnosis present

## 2022-08-13 DIAGNOSIS — N184 Chronic kidney disease, stage 4 (severe): Secondary | ICD-10-CM | POA: Diagnosis present

## 2022-08-13 DIAGNOSIS — Z91011 Allergy to milk products: Secondary | ICD-10-CM

## 2022-08-13 DIAGNOSIS — I6300 Cerebral infarction due to thrombosis of unspecified precerebral artery: Secondary | ICD-10-CM | POA: Diagnosis not present

## 2022-08-13 DIAGNOSIS — Z87440 Personal history of urinary (tract) infections: Secondary | ICD-10-CM

## 2022-08-13 DIAGNOSIS — N179 Acute kidney failure, unspecified: Secondary | ICD-10-CM | POA: Diagnosis not present

## 2022-08-13 DIAGNOSIS — R1312 Dysphagia, oropharyngeal phase: Secondary | ICD-10-CM | POA: Diagnosis present

## 2022-08-13 DIAGNOSIS — R252 Cramp and spasm: Secondary | ICD-10-CM | POA: Diagnosis not present

## 2022-08-13 DIAGNOSIS — Z881 Allergy status to other antibiotic agents status: Secondary | ICD-10-CM

## 2022-08-13 DIAGNOSIS — I69314 Frontal lobe and executive function deficit following cerebral infarction: Secondary | ICD-10-CM | POA: Diagnosis not present

## 2022-08-13 DIAGNOSIS — I69391 Dysphagia following cerebral infarction: Secondary | ICD-10-CM | POA: Diagnosis not present

## 2022-08-13 DIAGNOSIS — I639 Cerebral infarction, unspecified: Secondary | ICD-10-CM | POA: Diagnosis not present

## 2022-08-13 DIAGNOSIS — J811 Chronic pulmonary edema: Secondary | ICD-10-CM | POA: Diagnosis not present

## 2022-08-13 DIAGNOSIS — G464 Cerebellar stroke syndrome: Secondary | ICD-10-CM | POA: Diagnosis present

## 2022-08-13 DIAGNOSIS — Z9989 Dependence on other enabling machines and devices: Secondary | ICD-10-CM

## 2022-08-13 DIAGNOSIS — I6381 Other cerebral infarction due to occlusion or stenosis of small artery: Secondary | ICD-10-CM | POA: Diagnosis not present

## 2022-08-13 DIAGNOSIS — K5900 Constipation, unspecified: Secondary | ICD-10-CM | POA: Diagnosis not present

## 2022-08-13 DIAGNOSIS — R059 Cough, unspecified: Secondary | ICD-10-CM | POA: Diagnosis not present

## 2022-08-13 DIAGNOSIS — R0682 Tachypnea, not elsewhere classified: Secondary | ICD-10-CM | POA: Diagnosis not present

## 2022-08-13 DIAGNOSIS — Z993 Dependence on wheelchair: Secondary | ICD-10-CM

## 2022-08-13 DIAGNOSIS — H3530 Unspecified macular degeneration: Secondary | ICD-10-CM | POA: Diagnosis present

## 2022-08-13 DIAGNOSIS — E119 Type 2 diabetes mellitus without complications: Secondary | ICD-10-CM | POA: Diagnosis not present

## 2022-08-13 DIAGNOSIS — Z91040 Latex allergy status: Secondary | ICD-10-CM

## 2022-08-13 DIAGNOSIS — N200 Calculus of kidney: Secondary | ICD-10-CM | POA: Diagnosis present

## 2022-08-13 DIAGNOSIS — Z87892 Personal history of anaphylaxis: Secondary | ICD-10-CM

## 2022-08-13 DIAGNOSIS — I10 Essential (primary) hypertension: Secondary | ICD-10-CM | POA: Diagnosis not present

## 2022-08-13 DIAGNOSIS — Z7189 Other specified counseling: Secondary | ICD-10-CM | POA: Diagnosis not present

## 2022-08-13 DIAGNOSIS — Z841 Family history of disorders of kidney and ureter: Secondary | ICD-10-CM

## 2022-08-13 DIAGNOSIS — I63012 Cerebral infarction due to thrombosis of left vertebral artery: Secondary | ICD-10-CM | POA: Diagnosis not present

## 2022-08-13 DIAGNOSIS — R1310 Dysphagia, unspecified: Secondary | ICD-10-CM | POA: Diagnosis not present

## 2022-08-13 DIAGNOSIS — Z91048 Other nonmedicinal substance allergy status: Secondary | ICD-10-CM

## 2022-08-13 DIAGNOSIS — E785 Hyperlipidemia, unspecified: Secondary | ICD-10-CM | POA: Diagnosis not present

## 2022-08-13 DIAGNOSIS — Z7982 Long term (current) use of aspirin: Secondary | ICD-10-CM

## 2022-08-13 DIAGNOSIS — Z8249 Family history of ischemic heart disease and other diseases of the circulatory system: Secondary | ICD-10-CM

## 2022-08-13 DIAGNOSIS — R41844 Frontal lobe and executive function deficit: Secondary | ICD-10-CM | POA: Diagnosis not present

## 2022-08-13 DIAGNOSIS — Z833 Family history of diabetes mellitus: Secondary | ICD-10-CM

## 2022-08-13 DIAGNOSIS — Z885 Allergy status to narcotic agent status: Secondary | ICD-10-CM

## 2022-08-13 DIAGNOSIS — Z825 Family history of asthma and other chronic lower respiratory diseases: Secondary | ICD-10-CM

## 2022-08-13 LAB — GLUCOSE, CAPILLARY
Glucose-Capillary: 134 mg/dL — ABNORMAL HIGH (ref 70–99)
Glucose-Capillary: 160 mg/dL — ABNORMAL HIGH (ref 70–99)
Glucose-Capillary: 238 mg/dL — ABNORMAL HIGH (ref 70–99)

## 2022-08-13 LAB — CBC
HCT: 43.2 % (ref 36.0–46.0)
Hemoglobin: 14.2 g/dL (ref 12.0–15.0)
MCH: 26.9 pg (ref 26.0–34.0)
MCHC: 32.9 g/dL (ref 30.0–36.0)
MCV: 82 fL (ref 80.0–100.0)
Platelets: 278 10*3/uL (ref 150–400)
RBC: 5.27 MIL/uL — ABNORMAL HIGH (ref 3.87–5.11)
RDW: 15.1 % (ref 11.5–15.5)
WBC: 8.6 10*3/uL (ref 4.0–10.5)
nRBC: 0 % (ref 0.0–0.2)

## 2022-08-13 LAB — BASIC METABOLIC PANEL
Anion gap: 8 (ref 5–15)
BUN: 28 mg/dL — ABNORMAL HIGH (ref 8–23)
CO2: 19 mmol/L — ABNORMAL LOW (ref 22–32)
Calcium: 8.1 mg/dL — ABNORMAL LOW (ref 8.9–10.3)
Chloride: 111 mmol/L (ref 98–111)
Creatinine, Ser: 1.71 mg/dL — ABNORMAL HIGH (ref 0.44–1.00)
GFR, Estimated: 30 mL/min — ABNORMAL LOW (ref >60)
Glucose, Bld: 142 mg/dL — ABNORMAL HIGH (ref 70–99)
Potassium: 3.5 mmol/L (ref 3.5–5.1)
Sodium: 138 mmol/L (ref 135–145)

## 2022-08-13 LAB — CREATININE, SERUM
Creatinine, Ser: 1.75 mg/dL — ABNORMAL HIGH (ref 0.44–1.00)
GFR, Estimated: 30 mL/min — ABNORMAL LOW (ref >60)

## 2022-08-13 LAB — MAGNESIUM: Magnesium: 2 mg/dL (ref 1.7–2.4)

## 2022-08-13 MED ORDER — INSULIN ASPART 100 UNIT/ML IJ SOLN: 0.0000 [IU] | Freq: Three times a day (TID) | INTRAMUSCULAR | Status: DC

## 2022-08-13 MED ORDER — SENNOSIDES-DOCUSATE SODIUM 8.6-50 MG PO TABS: 2.0000 | ORAL_TABLET | Freq: Every evening | ORAL | Status: DC | PRN

## 2022-08-13 MED ORDER — CLOPIDOGREL BISULFATE 75 MG PO TABS: 75.0000 mg | ORAL_TABLET | Freq: Every day | ORAL | 0 refills | Status: DC

## 2022-08-13 MED ORDER — ACETAMINOPHEN 160 MG/5ML PO SOLN: 650.0000 mg | ORAL | Status: DC | PRN

## 2022-08-13 MED ORDER — ENOXAPARIN SODIUM 30 MG/0.3ML IJ SOSY: 30.0000 mg | PREFILLED_SYRINGE | INTRAMUSCULAR | Status: DC

## 2022-08-13 MED ORDER — POLYETHYLENE GLYCOL 3350 17 G PO PACK: 17.0000 g | PACK | Freq: Two times a day (BID) | ORAL | Status: DC

## 2022-08-13 MED ORDER — TRAZODONE HCL 50 MG PO TABS: 50.0000 mg | ORAL_TABLET | Freq: Every evening | ORAL | Status: DC | PRN

## 2022-08-13 MED ORDER — HYDRALAZINE HCL 20 MG/ML IJ SOLN
5.0000 mg | Freq: Once | INTRAMUSCULAR | Status: AC
  Administered 2022-08-13: 5 mg via INTRAVENOUS
  Filled 2022-08-13: qty 1

## 2022-08-13 MED ORDER — FENOFIBRATE 160 MG PO TABS: 160.0000 mg | ORAL_TABLET | Freq: Every day | ORAL | Status: DC

## 2022-08-13 MED ORDER — TAMSULOSIN HCL 0.4 MG PO CAPS
0.4000 mg | ORAL_CAPSULE | Freq: Every day | ORAL | Status: DC
  Administered 2022-08-13: 0.4 mg via ORAL
  Filled 2022-08-13: qty 1

## 2022-08-13 MED ORDER — IPRATROPIUM-ALBUTEROL 0.5-2.5 (3) MG/3ML IN SOLN: 3.0000 mL | RESPIRATORY_TRACT | Status: DC | PRN

## 2022-08-13 MED ORDER — ENOXAPARIN SODIUM 30 MG/0.3ML IJ SOSY
30.0000 mg | PREFILLED_SYRINGE | INTRAMUSCULAR | Status: DC
  Administered 2022-08-15: 30 mg via SUBCUTANEOUS
  Filled 2022-08-13: qty 0.3

## 2022-08-13 MED ORDER — FENOFIBRATE 160 MG PO TABS
160.0000 mg | ORAL_TABLET | Freq: Every day | ORAL | Status: DC
  Filled 2022-08-13 (×2): qty 1

## 2022-08-13 MED ORDER — OXYCODONE HCL 5 MG PO TABS: 5.0000 mg | ORAL_TABLET | ORAL | Status: DC | PRN

## 2022-08-13 MED ORDER — ACETAMINOPHEN 650 MG RE SUPP: 650.0000 mg | RECTAL | Status: DC | PRN

## 2022-08-13 MED ORDER — ACETAMINOPHEN 325 MG PO TABS: 650.0000 mg | ORAL_TABLET | ORAL | Status: DC | PRN

## 2022-08-13 MED ORDER — CLOPIDOGREL BISULFATE 75 MG PO TABS
75.0000 mg | ORAL_TABLET | Freq: Every day | ORAL | Status: DC
  Administered 2022-08-15 – 2022-08-18 (×3): 75 mg via ORAL
  Filled 2022-08-13 (×5): qty 1

## 2022-08-13 MED ORDER — SERTRALINE HCL 50 MG PO TABS
25.0000 mg | ORAL_TABLET | Freq: Every day | ORAL | Status: DC
  Filled 2022-08-13: qty 1

## 2022-08-13 MED ORDER — HYDRALAZINE HCL 50 MG PO TABS
50.0000 mg | ORAL_TABLET | Freq: Three times a day (TID) | ORAL | Status: DC
  Filled 2022-08-13 (×3): qty 1

## 2022-08-13 MED ORDER — ASPIRIN 81 MG PO TBEC
81.0000 mg | DELAYED_RELEASE_TABLET | Freq: Every day | ORAL | Status: DC
  Administered 2022-08-14 – 2022-08-20 (×6): 81 mg via ORAL
  Filled 2022-08-13 (×6): qty 1

## 2022-08-13 MED ORDER — HYDRALAZINE HCL 50 MG PO TABS: 25.0000 mg | ORAL_TABLET | Freq: Three times a day (TID) | ORAL | Status: DC

## 2022-08-13 MED ORDER — HYDRALAZINE HCL 50 MG PO TABS: 50.0000 mg | ORAL_TABLET | Freq: Three times a day (TID) | ORAL | Status: DC

## 2022-08-13 MED ORDER — EZETIMIBE 10 MG PO TABS
10.0000 mg | ORAL_TABLET | Freq: Every day | ORAL | Status: DC
  Filled 2022-08-13 (×2): qty 1

## 2022-08-13 NOTE — Progress Notes (Signed)
Orthopedic Tech Progress Note Patient Details:  [name] 1946/03/21 [id number] [employer] delivered to room Patient ID: [name], female   DOB: February 01, 1946, 77 y.o.   MRN: [id number]  [name] 08/13/2022, 7:25 PM

## 2022-08-13 NOTE — Progress Notes (Addendum)
Patient flags as a yellow MEWS due to blood pressure readings. PA and MD aware of elevated BP readings. Blodd pressure improving at this time. BP is 160/78, HR 70. Patient resting well without complaint. States she feels better after vomiting. Daughter no longer at bedside but plans to return to stay the night.

## 2022-08-13 NOTE — Progress Notes (Signed)
Inpatient Rehabilitation Care Coordinator Assessment and Plan Patient Details  Name: [name] MRN: [id number] Date of Birth: 08-08-1945  Today's Date: 08/13/2022  Hospital Problems: Principal Problem:   Lateral medullary syndrome  Past Medical History:  Past Medical History:  Diagnosis Date   Allergy    Arthritis    Broken ankle    Cataract    Diabetes mellitus without complication ([place])    History of colonoscopy    Hypertension    Macular degeneration syndrome    with edema---being treated.    Stage 4 chronic kidney disease ([place])    Stroke ([place]) 2019   Past Surgical History:  Past Surgical History:  Procedure Laterality Date   callous removal  Left 2017   located on 1st digit on L foot 2/2 diabetes   EYE SURGERY     Social History:  reports that she has never smoked. She has never used smokeless tobacco. She reports that she does not drink alcohol and does not use drugs.  Family / Support Systems Marital Status: Separated Patient Roles: Parent Children: [name]-daughter [phone]  [name]-daughter [phone] Other Supports: Friends and neighbors Anticipated Caregiver: daughter's Ability/Limitations of Caregiver: leliah lives with her and was assisting her Mom prior to admission since last 2019 Caregiver Availability: 24/7 Family Dynamics: Close knit with daughter's and will make sure she has 24/7 care at discharge. Daughter's are very committed to Mom and her care  Social History Preferred language: English Religion: Christian Cultural Background: No issues Education: Secretary/administrator educated Probation officer - How often do you need to have someone help you when you read instructions, pamphlets, or other written material from your doctor or pharmacy?: Often Writes: Yes Employment Status: Retired Date Retired/Disabled/Unemployed: Financial trader Issues: No issues Guardian/Conservator: None-according to MD  pt is not fully capable of making her  own decisions while here. Will look toward her daughter's who are very involved and here daily if any decisions need to be made while here   Abuse/Neglect Abuse/Neglect Assessment Can Be Completed: Yes Physical Abuse: Denies Verbal Abuse: Denies Sexual Abuse: Denies Exploitation of patient/patient's resources: Denies Self-Neglect: Denies  Patient response to: Social Isolation - How often do you feel lonely or isolated from those around you?: Never  Emotional Status Pt's affect, behavior and adjustment status: Pt is motivated to do well and start over, she was doing well in OP until this happened. Her daughter is here and report pt is very motivated to get back to where she was when this occurred. Pt is glad to be here on CIR was here in 2019 Recent Psychosocial Issues: other health issues and previous CVA in 2019 was still getting rehab for this and had started to make progress and was ambulating Psychiatric History: History of depression-takes medications for and finds it helpful. She may benefit from seeing neuro-psych while here for adjustments/coping Substance Abuse History: No issues  Patient / Family Perceptions, Expectations & Goals Pt/Family understanding of illness & functional limitations: Pt and daughter have a good understanding of her stroke and deficits, both have spoken with the MD and feel have a good understanding of her plan moving forward. Daughrer advocates for her Mom and is here daily to provide support also. Premorbid pt/family roles/activities: Mom, retiree, neighbor, friend, etc Anticipated changes in roles/activities/participation: resume Pt/family expectations/goals: Pt states: " Here I am again I was hoping I would not need to come here again."  Daughter states: " We will push ahead like we did before."  Community Resources [employer]: Other (Comment) (was doing OP Neuro-on [address] and aquatics started) Premorbid Home Care/DME Agencies: Other  (Comment) (transport chair, wheelchair, 3 in1, rw, rollator, quad cane, hospital bed, tub seat) Transportation available at discharge: daughter's Is the patient able to respond to transportation needs?: Yes In the past 12 months, has lack of transportation kept you from medical appointments or from getting medications?: No In the past 12 months, has lack of transportation kept you from meetings, work, or from getting things needed for daily living?: No Resource referrals recommended: Neuropsychology  Discharge Planning Living Arrangements: Children Support Systems: Children, Friends/neighbors Type of Residence: Private residence Insurance Resources: Multimedia programmer (specify) Actor) Financial Resources: [place], Other (Comment) (pension) Financial Screen Referred: No Living Expenses: Own Money Management: Patient, Family Does the patient have any problems obtaining your medications?: No Home Management: daughter Patient/Family Preliminary Plans: Return home with daughter who lives with and provides care to pt. Pt is hoping she can get back to ambulating like she had started to do at OP prior to admission. Aware of rehab since was here in 2019 after her first stroke Care Coordinator Barriers to Discharge: Insurance for SNF coverage Care Coordinator Anticipated Follow Up Needs: HH/OP  Clinical Impression Familiar pt and daughter this worker had in 2019 when here for first stroke. Daughter's are very involved and committed to pt and will make sure she has what she needs at discharge. Hope to go back to [employer] when discharged from here. Aware team conference on Wednesday  [name] 08/13/2022, 2:57 PM

## 2022-08-13 NOTE — Progress Notes (Signed)
Inpatient Rehabilitation Center Individual Statement of Services  Patient Name:  [name]  Date:  08/13/2022  Welcome to the [facility].  Our goal is to provide you with an individualized program based on your diagnosis and situation, designed to meet your specific needs.  With this comprehensive rehabilitation program, you will be expected to participate in at least 3 hours of rehabilitation therapies Monday-Friday, with modified therapy programming on the weekends.  Your rehabilitation program will include the following services:  Physical Therapy (PT), Occupational Therapy (OT), Speech Therapy (ST), 24 hour per day rehabilitation nursing, Therapeutic Recreaction (TR), Neuropsychology, Care Coordinator, Rehabilitation Medicine, Nutrition Services, and Pharmacy Services  Weekly team conferences will be held on Wednesday to discuss your progress.  Your Inpatient Rehabilitation Care Coordinator will talk with you frequently to get your input and to update you on team discussions.  Team conferences with you and your family in attendance may also be held.  Expected length of stay: 2.5 weeks  Overall anticipated outcome: min-mod-level  Depending on your progress and recovery, your program may change. Your Inpatient Rehabilitation Care Coordinator will coordinate services and will keep you informed of any changes. Your Inpatient Rehabilitation Care Coordinator's name and contact numbers are listed  below.  The following services may also be recommended but are not provided by the [facility]:   [name] will be made to provide these services after discharge if needed.  Arrangements include referral to agencies that provide these services.  Your insurance has been verified to be:  [employer] Your primary doctor is:  Sherrie Mustache  Pertinent information will be shared  with your doctor and your insurance company.  Inpatient Rehabilitation Care Coordinator:  Ovidio Kin, [place] or Emilia Beck  Information discussed with and copy given to patient by: [name], 08/13/2022, 2:59 PM

## 2022-08-13 NOTE — Progress Notes (Signed)
Inpatient Rehabilitation Admission Medication Review by a Pharmacist  A complete drug regimen review was completed for this patient to identify any potential clinically significant medication issues.  High Risk Drug Classes Is patient taking? Indication by Medication  Antipsychotic No   Anticoagulant Yes Lovenox - dvt ppx   Antibiotic No   Opioid Yes Oxycodone - pain   Antiplatelet Yes Aspirin, clopidogrel - DAPT   Hypoglycemics/insulin Yes Insulin aspart - DM2  Vasoactive Medication Yes Hydralazine - hypertension   Chemotherapy No   Other Yes Ezetimibe, fenofibrate - HLD Miralax, senokot-S - constipation  Trazodone - sleep  Sertraline - mood     Type of Medication Issue Identified Description of Issue Recommendation(s)  Drug Interaction(s) (clinically significant)     Duplicate Therapy     Allergy     No Medication Administration End Date     Incorrect Dose     Additional Drug Therapy Needed  Losartan 25 mg PO daily  Restart home med when appropriate   Significant med changes from prior encounter (inform family/care partners about these prior to discharge).    Other       Clinically significant medication issues were identified that warrant physician communication and completion of prescribed/recommended actions by midnight of the next day:  No  Name of provider notified for urgent issues identified:   Provider Method of Notification:     Pharmacist comments: Clopidogrel for 3 weeks, then continue on asa       Time spent performing this drug regimen review (minutes):  [place], PharmD PGY1 Pharmacy Resident   08/13/2022 2:16 PM

## 2022-08-13 NOTE — H&P (Signed)
Physical Medicine and Rehabilitation Admission H&P    Chief Complaint  Patient presents with   Emesis  : HPI: [name] is a 77 year old right-handed female with history significant for CVA with left-sided residual weakness maintained on low-dose aspirin and received inpatient rehab services 02/27/2018 - 03/14/2018, hypertension, hyperlipidemia, type 2 diabetes mellitus, CKD stage III, ankle fracture 1 year ago and wears a right ankle brace, recent UTI.  Per chart review patient lives with family and assistance as needed.  Patient ambulates with a platform rolling walker.  She was attending aquatics 2 times a week.  Daughter does assist with some ADLs.  Presented 08/07/2022 with generalized weakness decrease in appetite.  Patient had initially been seen in the ER 1 week prior for generalized weakness CT at that time showed no acute findings UA showed WBC greater than 50,000 she was placed on antibiotic therapy and discharged to home.  Daughter noted worsening of left facial droop with slurred speech not able to keep food down and intermittent nausea vomiting.  She presented back to the ED with CT/MRI showing a 4 mm acute left medullary infarction.  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  KUB showed no evidence of bowel obstruction or other acute findings.  Incidental findings of nonobstructive renal stone.  Admission chemistries unremarkable except potassium 3.4 glucose 190 BUN 38 creatinine 1.63, WBC 13,700.  Urinalysis negative nitrite, hemoglobin A1c 7.8.  Echo with ejection fraction of 60 to 65% no wall motion abnormalities grade 2 diastolic dysfunction no regional wall motion abnormalities.  Neurology follow-up maintained on low-dose aspirin with the addition of Plavix for CVA prophylaxis.  Lovenox for DVT prophylaxis x 3 weeks then aspirin alone.  She is currently on a mechanical soft nectar thick liquid diet.  Hospital course bouts  of urinary retention Foley tube maintained through 2/3 and removed but failed voiding trial and replaced 2/4.  AKI on CKD stage IV creatinine increasing to 2.0 suspect due to urinary retention placed on gentle hydration and losartan was held.  Therapy evaluations completed due to patient decreased functional mobility and dysphagia was admitted for a comprehensive rehab program.  Pt reports used to get Botox by Dr Letta Pate in clinic Wears R foot/ankle lace up bootie due to ankle fracture.  LBM yesterday- not constipated Not feeling like needs to pee, even when foley wasn't in place-   Review of Systems  Constitutional:  Positive for diaphoresis. Negative for chills and fever.       Poor p.o. intake  HENT:  Negative for hearing loss.   Eyes:  Negative for blurred vision and double vision.  Respiratory:  Negative for cough, shortness of breath and wheezing.   Cardiovascular:  Negative for chest pain, palpitations and leg swelling.  Gastrointestinal:  Positive for constipation, nausea and vomiting. Negative for heartburn.  Genitourinary:  Negative for dysuria, flank pain and hematuria.  Musculoskeletal:  Positive for joint pain and myalgias.  Skin:  Negative for rash.  Neurological:  Positive for speech change and weakness.  Psychiatric/Behavioral:  Positive for depression. The patient has insomnia.   All other systems reviewed and are negative.  Past Medical History:  Diagnosis Date   Allergy    Arthritis    Broken ankle    Cataract    Diabetes mellitus without complication ([place])    History of colonoscopy    Hypertension    Macular degeneration syndrome    with edema---being treated.  Stage 4 chronic kidney disease ([place])    Stroke ([place]) 2019   Past Surgical History:  Procedure Laterality Date   callous removal  Left 2017   located on 1st digit on L foot 2/2 diabetes   EYE SURGERY     Family History  Problem Relation Age of Onset   Kidney disease Mother    Congestive Heart  Failure Father    COPD Father    COPD Brother    Diabetes Maternal Aunt    Social History:  reports that she has never smoked. She has never used smokeless tobacco. She reports that she does not drink alcohol and does not use drugs. Allergies:  Allergies  Allergen Reactions   Cephalexin Diarrhea and Nausea And Vomiting   Clonidine Anaphylaxis, Swelling and Other (See Comments)    Tongue swelling   Norvasc [Amlodipine Besylate] Swelling    Edema and TONGUE swelling    Lactose Intolerance (Gi) Diarrhea   Latex Other (See Comments)    Skin blisters   Lisinopril Cough   Morphine And Related Itching and Other (See Comments)    Throat itched and became scratchy   Tape Itching and Other (See Comments)    Some tapes irritate the skin and others don't (redness/itchiness)   Amlodipine Anxiety, Nausea Only and Swelling   Codeine Itching and Rash   Crestor [Rosuvastatin] Rash    Red and flushed in her face   Medications Prior to Admission  Medication Sig Dispense Refill   acetaminophen (RA ACETAMINOPHEN) 650 MG CR tablet Take 1 tablet (650 mg total) by mouth every 8 (eight) hours as needed for pain. 100 tablet 2   AMBULATORY NON FORMULARY MEDICATION Take 1 spray by mouth See admin instructions. Medication Name: "Anxious Moments" oral spray- Spray into the mouth at bedtime as directed     losartan (COZAAR) 25 MG tablet TAKE 1 TABLET BY MOUTH TWICE A DAY (Patient taking differently: Take 25 mg by mouth daily.) 180 tablet 1   Accu-Chek Softclix Lancets lancets Use to test blood sugar twice daily. Dx: E11.22 200 each 3   aspirin 81 MG EC tablet Take 1 tablet (81 mg total) by mouth daily. Swallow whole. (Patient not taking: Reported on 08/07/2022) 30 tablet 12   Blood Glucose Calibration (ACCU-CHEK GUIDE CONTROL) LIQD Use in Checking blood sugar twice daily. Dx: E11.22 1 each 3   Blood Glucose Monitoring Suppl (ACCU-CHEK GUIDE ME) w/Device KIT Use to test blood sugar twice daily. Dx: E11.22 1 kit 0    cephALEXin (KEFLEX) 500 MG capsule Take 1 capsule (500 mg total) by mouth 4 (four) times daily. (Patient not taking: Reported on 08/07/2022) 20 capsule 0   cholecalciferol (VITAMIN D3) 25 MCG (1000 UNIT) tablet Take 1,000 Units by mouth 2 (two) times a week. (Patient not taking: Reported on 08/07/2022)     ezetimibe (ZETIA) 10 MG tablet Take 1 tablet (10 mg total) by mouth daily. (Patient not taking: Reported on 08/07/2022) 90 tablet 1   fenofibrate (TRICOR) 145 MG tablet Take 1 tablet (145 mg total) by mouth daily. (Patient not taking: Reported on 08/07/2022) 30 tablet 1   glucose blood (ACCU-CHEK GUIDE) test strip Use to check blood sugar twice daily. Dx: E11.22 200 each 3   ketoconazole (NIZORAL) 2 % cream Apply to feet twice a day for 3 weeks (Patient not taking: Reported on 08/07/2022) 60 g 2   loratadine (CLARITIN) 10 MG tablet Take 1 tablet (10 mg total) by mouth daily. (Patient not taking:  Reported on 08/07/2022) 30 tablet 3   Probiotic Product (PROBIOTIC DAILY PO) Take by mouth. (Patient not taking: Reported on 08/07/2022)     sertraline (ZOLOFT) 25 MG tablet Take 1 tablet (25 mg total) by mouth daily. Diagnosis Association: Anxiety (F41.9) (Patient not taking: Reported on 08/07/2022) 90 tablet 1      Home: [place] expects to be discharged to:: Private residence Living Arrangements: Children (daughter) Available Help at Discharge: Family, Available 24 hours/day Type of Home: House Home Access: [place]: One level Bathroom Shower/Tub: Multimedia programmer: Standard Bathroom Accessibility: Yes Home Equipment: Wheelchair - manual, Transport chair, BSC/3in1, Civil engineer, contracting, Conservation officer, nature (2 wheels), Rollator (4 wheels), Cane - quad, [place] - single point, Hospital bed (plan for getting power wheelchair. Shower seat is "terrible" per daughter) Additional Comments: OPPT 2x/week aquatics. Was able to ambulate ~>132ft with rest breaks using platform  walker per pt.  Lives With: Daughter   Functional History: Prior Function Prior Level of Function : Needs assist Physical Assist : ADLs (physical), Mobility (physical) Mobility Comments: can propel transport chair with her feet. Stand step transfer to transport chairs. broken ankle 1 yr ago, wears R ankle brace with all weight bearing ADLs Comments: daughter assist with ADLs. Rolling into shower with transport chair, prior to ankle break pt was able to stand and step onto shower chair- daughter reports that she tends to help more than probably needed, especially in shower as she nervous of pt having a fall.  Functional Status:  Mobility: Bed Mobility Overal bed mobility: Needs Assistance Bed Mobility: Supine to Sit Supine to sit: Max assist, +2 for physical assistance, +2 for safety/equipment Sit to supine: Total assist, +2 for physical assistance, +2 for safety/equipment General bed mobility comments: patient was up in personal transport chair today. patient asked to remain in chair to eat berries Transfers Overall transfer level: Needs assistance Equipment used: Rolling walker (2 wheels), 2 person hand held assist Transfers: Sit to/from Stand, Bed to chair/wheelchair/BSC Sit to Stand: Mod assist, Max assist, +2 physical assistance, +2 safety/equipment Bed to/from chair/wheelchair/BSC transfer type:: Lateral/scoot transfer Stand pivot transfers: Max assist, +2 physical assistance, +2 safety/equipment  Lateral/Scoot Transfers: +2 physical assistance, +2 safety/equipment, Total assist General transfer comment: bed pad used to scoot pt back to bed +2 total assist (pt was scooting toward foot plate after 10 minutes in recliner)      ADL: ADL Overall ADL's : Needs assistance/impaired Eating/Feeding: Set up, Sitting Eating/Feeding Details (indicate cue type and reason): based on clinical judgement Grooming: Wash/dry hands, Wash/dry face, Oral care, Brushing hair, Set up, Minimal  assistance Grooming Details (indicate cue type and reason): min A to set up for RUE to complete Upper Body Bathing: Minimal assistance Upper Body Bathing Details (indicate cue type and reason): to wash R arm and under breasts. Lower Body Bathing: Moderate assistance Lower Body Bathing Details (indicate cue type and reason): able to wash tops of thighs with RUE. Upper Body Dressing : Moderate assistance Upper Body Dressing Details (indicate cue type and reason): to don gown Lower Body Dressing: Maximal assistance Toileting- Clothing Manipulation and Hygiene: Maximal assistance, Bed level Toileting - Clothing Manipulation Details (indicate cue type and reason): based on clinical judgement  Cognition: Cognition Overall Cognitive Status: Within Functional Limits for tasks assessed Orientation Level: Oriented X4 Cognition Arousal/Alertness: Awake/alert Behavior During Therapy: WFL for tasks assessed/performed Overall Cognitive Status: Within Functional Limits for tasks assessed General Comments: patient was alert and cooperative today.  Physical Exam: Blood pressure (!) 182/80, pulse 74, temperature (!) 97.4 F (36.3 C), temperature source Oral, resp. rate 18, height 5\' 2"  (1.575 m), weight 63.4 kg, SpO2 98 %. Physical Exam Vitals and nursing note reviewed. Exam conducted with a chaperone present.  Constitutional:      Comments: Frail appearing; awake, somewhat alert, but very little speech; Sitting up in bed; daughter at bedside, NAD  HENT:     Head: Normocephalic and atraumatic.     Comments: L facial droop- from prior stroke Tongue midline    Right Ear: External ear normal.     Left Ear: External ear normal.     Nose: Nose normal. No congestion.     Mouth/Throat:     Mouth: Mucous membranes are dry.     Pharynx: Oropharynx is clear. No oropharyngeal exudate.  Eyes:     General:        Right eye: No discharge.        Left eye: No discharge.     Extraocular Movements:  Extraocular movements intact.     Comments: Nystagmus to L>R  Cardiovascular:     Rate and Rhythm: Normal rate and regular rhythm.     Heart sounds: Normal heart sounds. No murmur heard.    No gallop.  Pulmonary:     Effort: No respiratory distress.     Breath sounds: Normal breath sounds. No wheezing, rhonchi or rales.  Abdominal:     General: Bowel sounds are normal. There is no distension.     Palpations: Abdomen is soft.     Tenderness: There is no abdominal tenderness.  Genitourinary:    Comments: Foley in place; medium amber urine Musculoskeletal:     Cervical back: Neck supple. No tenderness.     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  Speech is mildly dysarthric but intelligible.  Follows simple commands.  Provides name and age. Finger to nose on R side ok, but cannot test L Denies dizziness  Psychiatric:     Comments: Flat, decreased interaction     Results for orders placed or performed during the hospital encounter of 08/07/22 (from the past 48 hour(s))  Glucose, capillary     Status: Abnormal   Collection Time: 08/11/22  7:54 AM  Result Value Ref Range   Glucose-Capillary 116 (H) 70 - 99 mg/dL    Comment: Glucose reference range applies only to samples taken after fasting for at least 8 hours.  Glucose, capillary     Status: Abnormal   Collection Time: 08/11/22 11:39 AM  Result Value Ref Range   Glucose-Capillary 217 (H) 70 - 99 mg/dL    Comment: Glucose reference range applies only to samples taken after fasting for at least 8 hours.  Glucose, capillary     Status: Abnormal   Collection Time: 08/11/22  4:46 PM  Result Value Ref Range   Glucose-Capillary 159 (H) 70 - 99 mg/dL     Comment: Glucose reference range applies only to samples taken after fasting for at least 8 hours.  Glucose, capillary     Status: Abnormal   Collection Time: 08/11/22  9:11 PM  Result Value Ref Range  Glucose-Capillary 209 (H) 70 - 99 mg/dL    Comment: Glucose reference range applies only to samples taken after fasting for at least 8 hours.  Basic metabolic panel     Status: Abnormal   Collection Time: 08/12/22  4:27 AM  Result Value Ref Range   Sodium 140 135 - 145 mmol/L   Potassium 3.7 3.5 - 5.1 mmol/L   Chloride 114 (H) 98 - 111 mmol/L   CO2 19 (L) 22 - 32 mmol/L   Glucose, Bld 163 (H) 70 - 99 mg/dL    Comment: Glucose reference range applies only to samples taken after fasting for at least 8 hours.   BUN 31 (H) 8 - 23 mg/dL   Creatinine, Ser 2.01 (H) 0.44 - 1.00 mg/dL   Calcium 8.5 (L) 8.9 - 10.3 mg/dL   GFR, Estimated 25 (L) >60 mL/min    Comment: (NOTE) Calculated using the CKD-EPI Creatinine Equation (2021)    Anion gap 7 5 - 15    Comment: Performed at [facility], [place] [address], [place], [place] [address]  Magnesium     Status: None   Collection Time: 08/12/22  4:27 AM  Result Value Ref Range   Magnesium 2.3 1.7 - 2.4 mg/dL    Comment: Performed at [facility], [place] [address] [place], [place] [address]  Glucose, capillary     Status: Abnormal   Collection Time: 08/12/22  7:30 AM  Result Value Ref Range   Glucose-Capillary 158 (H) 70 - 99 mg/dL    Comment: Glucose reference range applies only to samples taken after fasting for at least 8 hours.  Glucose, capillary     Status: Abnormal   Collection Time: 08/12/22 11:18 AM  Result Value Ref Range   Glucose-Capillary 179 (H) 70 - 99 mg/dL    Comment: Glucose reference range applies only to samples taken after fasting for at least 8 hours.  Glucose, capillary     Status: Abnormal   Collection Time: 08/12/22  4:31 PM  Result Value Ref Range   Glucose-Capillary 146 (H) 70 -  99 mg/dL    Comment: Glucose reference range applies only to samples taken after fasting for at least 8 hours.  Glucose, capillary     Status: Abnormal   Collection Time: 08/12/22  8:20 PM  Result Value Ref Range   Glucose-Capillary 191 (H) 70 - 99 mg/dL    Comment: Glucose reference range applies only to samples taken after fasting for at least 8 hours.  Basic metabolic panel     Status: Abnormal   Collection Time: 08/13/22  4:30 AM  Result Value Ref Range   Sodium 138 135 - 145 mmol/L   Potassium 3.5 3.5 - 5.1 mmol/L   Chloride 111 98 - 111 mmol/L   CO2 19 (L) 22 - 32 mmol/L   Glucose, Bld 142 (H) 70 - 99 mg/dL    Comment: Glucose reference range applies only to samples taken after fasting for at least 8 hours.   BUN 28 (H) 8 - 23 mg/dL   Creatinine, Ser 1.71 (H) 0.44 - 1.00 mg/dL   Calcium 8.1 (L) 8.9 - 10.3 mg/dL   GFR, Estimated 30 (L) >60 mL/min    Comment: (NOTE) Calculated using the CKD-EPI Creatinine Equation (2021)    Anion gap 8 5 - 15    Comment: Performed at [facility], [place] [address] [place], [place] [address]  Magnesium     Status: None  Collection Time: 08/13/22  4:30 AM  Result Value Ref Range   Magnesium 2.0 1.7 - 2.4 mg/dL    Comment: Performed at [facility], [place] [address], [place], [place] [address]   No results found.    Blood pressure (!) 182/80, pulse 74, temperature (!) 97.4 F (36.3 C), temperature source Oral, resp. rate 18, height 5\' 2"  (1.575 m), weight 63.4 kg, SpO2 98 %.  Medical Problem List and Plan: 1. Functional deficits secondary to left lateral medullary infarction likely secondary to small vessel disease as well as history of CVA 2019 with left-sided residual weakness  -patient may  shower  -ELOS/Goals: 14-20 days min A 2.  Antithrombotics: -DVT/anticoagulation:  Pharmaceutical: Lovenox  -antiplatelet therapy: Aspirin 81 mg daily and Plavix 75 mg day x 3 weeks then aspirin alone 3. Pain  Management: Oxycodone as needed 4. Mood/Behavior/Sleep: Zoloft 25 mg daily, trazodone as needed  -antipsychotic agents: N/A 5. Neuropsych/cognition: This patient is? capable of making decisions on her own behalf. 6. Skin/Wound Care: Routine skin checks 7. Fluids/Electrolytes/Nutrition: Routine in and outs with follow-up chemistries 8.  Dysphagia.  Dysphagia #3 nectar thick liquids.  Follow-up speech therapy 9.  Hypertension.  Hydralazine 50 mg every 8 hours.  Losartan recently held due to AKI. Daughter notes BP been the best controlled in a long time.  10.  Acute urinary retention.  Foley removed 2/3 but failed voiding trial therefore replaced 2/4.  Patient with recent UTI we will discontinue Foley tube and work with voiding trial 11.  AKI on CKD stage III.  Baseline creatinine 1.9-2.0.  Latest creatinine 1.71.  Losartan held.  Follow-up chemistries 12.  Diabetes mellitus.  Hemoglobin A1c 7.8.  SSI.  Previously patient had declined initiation of any diabetic medications per documentation.  Will follow-up with family. 13.  History of right ankle fracture.  Patient does wear an ankle stabilizing brace due to previous ankle fx. . 14.  Hyperlipidemia.  Zetia/fenofibrate. 15.  Constipation.  MiraLAX twice daily, Senokot 2 tabs at bedtime as needed.  Hold for loose stools. 16. 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    I have personally performed a face to face diagnostic evaluation of this patient and formulated the key components of the plan.  Additionally, I have personally reviewed laboratory data, imaging studies, as well as relevant notes and concur with the physician assistant's documentation above.   The patient's status has not changed from the original H&P.  Any changes in documentation from the acute care chart have been noted above.    Lavon Paganini Angiulli, PA-C 08/13/2022

## 2022-08-13 NOTE — H&P (Signed)
Physical Medicine and Rehabilitation Admission H&P        Chief Complaint  Patient presents with   Emesis  : HPI: [name] is a 77 year old right-handed female with history significant for CVA with left-sided residual weakness maintained on low-dose aspirin and received inpatient rehab services 02/27/2018 - 03/14/2018, hypertension, hyperlipidemia, type 2 diabetes mellitus, CKD stage III, ankle fracture 1 year ago and wears a right ankle brace, recent UTI.  Per chart review patient lives with family and assistance as needed.  Patient ambulates with a platform rolling walker.  She was attending aquatics 2 times a week.  Daughter does assist with some ADLs.  Presented 08/07/2022 with generalized weakness decrease in appetite.  Patient had initially been seen in the ER 1 week prior for generalized weakness CT at that time showed no acute findings UA showed WBC greater than 50,000 she was placed on antibiotic therapy and discharged to home.  Daughter noted worsening of left facial droop with slurred speech not able to keep food down and intermittent nausea vomiting.  She presented back to the ED with CT/MRI showing a 4 mm acute left medullary infarction.  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  KUB showed no evidence of bowel obstruction or other acute findings.  Incidental findings of nonobstructive renal stone.  Admission chemistries unremarkable except potassium 3.4 glucose 190 BUN 38 creatinine 1.63, WBC 13,700.  Urinalysis negative nitrite, hemoglobin A1c 7.8.  Echo with ejection fraction of 60 to 65% no wall motion abnormalities grade 2 diastolic dysfunction no regional wall motion abnormalities.  Neurology follow-up maintained on low-dose aspirin with the addition of Plavix for CVA prophylaxis.  Lovenox for DVT prophylaxis x 3 weeks then aspirin alone.  She is currently on a mechanical soft nectar thick liquid diet.  Hospital course  bouts of urinary retention Foley tube maintained through 2/3 and removed but failed voiding trial and replaced 2/4.  AKI on CKD stage IV creatinine increasing to 2.0 suspect due to urinary retention placed on gentle hydration and losartan was held.  Therapy evaluations completed due to patient decreased functional mobility and dysphagia was admitted for a comprehensive rehab program.   Pt reports used to get Botox by Dr Letta Pate in clinic Wears R foot/ankle lace up bootie due to ankle fracture.  LBM yesterday- not constipated Not feeling like needs to pee, even when foley wasn't in place-    Review of Systems  Constitutional:  Positive for diaphoresis. Negative for chills and fever.       Poor p.o. intake  HENT:  Negative for hearing loss.   Eyes:  Negative for blurred vision and double vision.  Respiratory:  Negative for cough, shortness of breath and wheezing.   Cardiovascular:  Negative for chest pain, palpitations and leg swelling.  Gastrointestinal:  Positive for constipation, nausea and vomiting. Negative for heartburn.  Genitourinary:  Negative for dysuria, flank pain and hematuria.  Musculoskeletal:  Positive for joint pain and myalgias.  Skin:  Negative for rash.  Neurological:  Positive for speech change and weakness.  Psychiatric/Behavioral:  Positive for depression. The patient has insomnia.   All other systems reviewed and are negative.       Past Medical History:  Diagnosis Date   Allergy     Arthritis     Broken ankle     Cataract     Diabetes mellitus without complication ([place])     History of colonoscopy  Hypertension     Macular degeneration syndrome      with edema---being treated.    Stage 4 chronic kidney disease ([place])     Stroke ([place]) 2019         Past Surgical History:  Procedure Laterality Date   callous removal  Left 2017    located on 1st digit on L foot 2/2 diabetes   EYE SURGERY             Family History  Problem Relation Age of Onset    Kidney disease Mother     Congestive Heart Failure Father     COPD Father     COPD Brother     Diabetes Maternal Aunt      Social History:  reports that she has never smoked. She has never used smokeless tobacco. She reports that she does not drink alcohol and does not use drugs. Allergies:       Allergies  Allergen Reactions   Cephalexin Diarrhea and Nausea And Vomiting   Clonidine Anaphylaxis, Swelling and Other (See Comments)      Tongue swelling   Norvasc [Amlodipine Besylate] Swelling      Edema and TONGUE swelling     Lactose Intolerance (Gi) Diarrhea   Latex Other (See Comments)      Skin blisters   Lisinopril Cough   Morphine And Related Itching and Other (See Comments)      Throat itched and became scratchy   Tape Itching and Other (See Comments)      Some tapes irritate the skin and others don't (redness/itchiness)   Amlodipine Anxiety, Nausea Only and Swelling   Codeine Itching and Rash   Crestor [Rosuvastatin] Rash      Red and flushed in her face          Medications Prior to Admission  Medication Sig Dispense Refill   acetaminophen (RA ACETAMINOPHEN) 650 MG CR tablet Take 1 tablet (650 mg total) by mouth every 8 (eight) hours as needed for pain. 100 tablet 2   AMBULATORY NON FORMULARY MEDICATION Take 1 spray by mouth See admin instructions. Medication Name: "Anxious Moments" oral spray- Spray into the mouth at bedtime as directed       losartan (COZAAR) 25 MG tablet TAKE 1 TABLET BY MOUTH TWICE A DAY (Patient taking differently: Take 25 mg by mouth daily.) 180 tablet 1   Accu-Chek Softclix Lancets lancets Use to test blood sugar twice daily. Dx: E11.22 200 each 3   aspirin 81 MG EC tablet Take 1 tablet (81 mg total) by mouth daily. Swallow whole. (Patient not taking: Reported on 08/07/2022) 30 tablet 12   Blood Glucose Calibration (ACCU-CHEK GUIDE CONTROL) LIQD Use in Checking blood sugar twice daily. Dx: E11.22 1 each 3   Blood Glucose Monitoring Suppl (ACCU-CHEK  GUIDE ME) w/Device KIT Use to test blood sugar twice daily. Dx: E11.22 1 kit 0   cephALEXin (KEFLEX) 500 MG capsule Take 1 capsule (500 mg total) by mouth 4 (four) times daily. (Patient not taking: Reported on 08/07/2022) 20 capsule 0   cholecalciferol (VITAMIN D3) 25 MCG (1000 UNIT) tablet Take 1,000 Units by mouth 2 (two) times a week. (Patient not taking: Reported on 08/07/2022)       ezetimibe (ZETIA) 10 MG tablet Take 1 tablet (10 mg total) by mouth daily. (Patient not taking: Reported on 08/07/2022) 90 tablet 1   fenofibrate (TRICOR) 145 MG tablet Take 1 tablet (145 mg total) by mouth daily. (Patient not taking:  Reported on 08/07/2022) 30 tablet 1   glucose blood (ACCU-CHEK GUIDE) test strip Use to check blood sugar twice daily. Dx: E11.22 200 each 3   ketoconazole (NIZORAL) 2 % cream Apply to feet twice a day for 3 weeks (Patient not taking: Reported on 08/07/2022) 60 g 2   loratadine (CLARITIN) 10 MG tablet Take 1 tablet (10 mg total) by mouth daily. (Patient not taking: Reported on 08/07/2022) 30 tablet 3   Probiotic Product (PROBIOTIC DAILY PO) Take by mouth. (Patient not taking: Reported on 08/07/2022)       sertraline (ZOLOFT) 25 MG tablet Take 1 tablet (25 mg total) by mouth daily. Diagnosis Association: Anxiety (F41.9) (Patient not taking: Reported on 08/07/2022) 90 tablet 1          Home: [place] expects to be discharged to:: Private residence Living Arrangements: Children (daughter) Available Help at Discharge: Family, Available 24 hours/day Type of Home: House Home Access: [place]: One level Bathroom Shower/Tub: Multimedia programmer: Standard Bathroom Accessibility: Yes Home Equipment: Wheelchair - manual, Transport chair, BSC/3in1, Civil engineer, contracting, Conservation officer, nature (2 wheels), Rollator (4 wheels), Cane - quad, [place] - single point, Hospital bed (plan for getting power wheelchair. Shower seat is "terrible" per daughter) Additional Comments:  OPPT 2x/week aquatics. Was able to ambulate ~>143ft with rest breaks using platform walker per pt.  Lives With: Daughter   Functional History: Prior Function Prior Level of Function : Needs assist Physical Assist : ADLs (physical), Mobility (physical) Mobility Comments: can propel transport chair with her feet. Stand step transfer to transport chairs. broken ankle 1 yr ago, wears R ankle brace with all weight bearing ADLs Comments: daughter assist with ADLs. Rolling into shower with transport chair, prior to ankle break pt was able to stand and step onto shower chair- daughter reports that she tends to help more than probably needed, especially in shower as she nervous of pt having a fall.   Functional Status:  Mobility: Bed Mobility Overal bed mobility: Needs Assistance Bed Mobility: Supine to Sit Supine to sit: Max assist, +2 for physical assistance, +2 for safety/equipment Sit to supine: Total assist, +2 for physical assistance, +2 for safety/equipment General bed mobility comments: patient was up in personal transport chair today. patient asked to remain in chair to eat berries Transfers Overall transfer level: Needs assistance Equipment used: Rolling walker (2 wheels), 2 person hand held assist Transfers: Sit to/from Stand, Bed to chair/wheelchair/BSC Sit to Stand: Mod assist, Max assist, +2 physical assistance, +2 safety/equipment Bed to/from chair/wheelchair/BSC transfer type:: Lateral/scoot transfer Stand pivot transfers: Max assist, +2 physical assistance, +2 safety/equipment  Lateral/Scoot Transfers: +2 physical assistance, +2 safety/equipment, Total assist General transfer comment: bed pad used to scoot pt back to bed +2 total assist (pt was scooting toward foot plate after 10 minutes in recliner)   ADL: ADL Overall ADL's : Needs assistance/impaired Eating/Feeding: Set up, Sitting Eating/Feeding Details (indicate cue type and reason): based on clinical judgement Grooming:  Wash/dry hands, Wash/dry face, Oral care, Brushing hair, Set up, Minimal assistance Grooming Details (indicate cue type and reason): min A to set up for RUE to complete Upper Body Bathing: Minimal assistance Upper Body Bathing Details (indicate cue type and reason): to wash R arm and under breasts. Lower Body Bathing: Moderate assistance Lower Body Bathing Details (indicate cue type and reason): able to wash tops of thighs with RUE. Upper Body Dressing : Moderate assistance Upper Body Dressing Details (indicate cue type and reason): to  don gown Lower Body Dressing: Maximal assistance Toileting- Clothing Manipulation and Hygiene: Maximal assistance, Bed level Toileting - Clothing Manipulation Details (indicate cue type and reason): based on clinical judgement   Cognition: Cognition Overall Cognitive Status: Within Functional Limits for tasks assessed Orientation Level: Oriented X4 Cognition Arousal/Alertness: Awake/alert Behavior During Therapy: WFL for tasks assessed/performed Overall Cognitive Status: Within Functional Limits for tasks assessed General Comments: patient was alert and cooperative today.   Physical Exam: Blood pressure (!) 182/80, pulse 74, temperature (!) 97.4 F (36.3 C), temperature source Oral, resp. rate 18, height 5\' 2"  (1.575 m), weight 63.4 kg, SpO2 98 %. Physical Exam Vitals and nursing note reviewed. Exam conducted with a chaperone present.  Constitutional:      Comments: Frail appearing; awake, somewhat alert, but very little speech; Sitting up in bed; daughter at bedside, NAD  HENT:     Head: Normocephalic and atraumatic.     Comments: L facial droop- from prior stroke Tongue midline    Right Ear: External ear normal.     Left Ear: External ear normal.     Nose: Nose normal. No congestion.     Mouth/Throat:     Mouth: Mucous membranes are dry.     Pharynx: Oropharynx is clear. No oropharyngeal exudate.  Eyes:     General:        Right eye: No  discharge.        Left eye: No discharge.     Extraocular Movements: Extraocular movements intact.     Comments: Nystagmus to L>R  Cardiovascular:     Rate and Rhythm: Normal rate and regular rhythm.     Heart sounds: Normal heart sounds. No murmur heard.    No gallop.  Pulmonary:     Effort: No respiratory distress.     Breath sounds: Normal breath sounds. No wheezing, rhonchi or rales.  Abdominal:     General: Bowel sounds are normal. There is no distension.     Palpations: Abdomen is soft.     Tenderness: There is no abdominal tenderness.  Genitourinary:    Comments: Foley in place; medium amber urine Musculoskeletal:     Cervical back: Neck supple. No tenderness.     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  Speech is mildly dysarthric but intelligible.  Follows simple commands.  Provides name and age. Finger to nose on R side ok, but cannot test L Denies dizziness  Psychiatric:     Comments: Flat, decreased interaction        Lab Results Last 48 Hours        Results for orders placed or performed during the hospital encounter of 08/07/22 (from the past 48 hour(s))  Glucose, capillary     Status: Abnormal    Collection Time: 08/11/22  7:54 AM  Result Value Ref Range    Glucose-Capillary 116 (H) 70 - 99 mg/dL      Comment: Glucose reference range applies only to samples taken after fasting for at least 8 hours.  Glucose, capillary     Status: Abnormal    Collection Time: 08/11/22 11:39 AM  Result Value Ref Range    Glucose-Capillary 217 (H) 70 -  99 mg/dL      Comment: Glucose reference range applies only to samples taken after fasting for at least 8 hours.  Glucose, capillary      Status: Abnormal    Collection Time: 08/11/22  4:46 PM  Result Value Ref Range    Glucose-Capillary 159 (H) 70 - 99 mg/dL      Comment: Glucose reference range applies only to samples taken after fasting for at least 8 hours.  Glucose, capillary     Status: Abnormal    Collection Time: 08/11/22  9:11 PM  Result Value Ref Range    Glucose-Capillary 209 (H) 70 - 99 mg/dL      Comment: Glucose reference range applies only to samples taken after fasting for at least 8 hours.  Basic metabolic panel     Status: Abnormal    Collection Time: 08/12/22  4:27 AM  Result Value Ref Range    Sodium 140 135 - 145 mmol/L    Potassium 3.7 3.5 - 5.1 mmol/L    Chloride 114 (H) 98 - 111 mmol/L    CO2 19 (L) 22 - 32 mmol/L    Glucose, Bld 163 (H) 70 - 99 mg/dL      Comment: Glucose reference range applies only to samples taken after fasting for at least 8 hours.    BUN 31 (H) 8 - 23 mg/dL    Creatinine, Ser 2.01 (H) 0.44 - 1.00 mg/dL    Calcium 8.5 (L) 8.9 - 10.3 mg/dL    GFR, Estimated 25 (L) >60 mL/min      Comment: (NOTE) Calculated using the CKD-EPI Creatinine Equation (2021)      Anion gap 7 5 - 15      Comment: Performed at [facility], [place] [address] [place], [place] [address]  Magnesium     Status: None    Collection Time: 08/12/22  4:27 AM  Result Value Ref Range    Magnesium 2.3 1.7 - 2.4 mg/dL      Comment: Performed at [facility], [place] [address], [place], [place] [address]  Glucose, capillary     Status: Abnormal    Collection Time: 08/12/22  7:30 AM  Result Value Ref Range    Glucose-Capillary 158 (H) 70 - 99 mg/dL      Comment: Glucose reference range applies only to samples taken after fasting for at least 8 hours.  Glucose, capillary     Status: Abnormal    Collection Time: 08/12/22 11:18 AM  Result Value Ref Range    Glucose-Capillary 179 (H) 70 - 99 mg/dL      Comment: Glucose reference range applies only to samples taken after fasting  for at least 8 hours.  Glucose, capillary     Status: Abnormal    Collection Time: 08/12/22  4:31 PM  Result Value Ref Range    Glucose-Capillary 146 (H) 70 - 99 mg/dL      Comment: Glucose reference range applies only to samples taken after fasting for at least 8 hours.  Glucose, capillary     Status: Abnormal    Collection Time: 08/12/22  8:20 PM  Result Value Ref Range    Glucose-Capillary 191 (H) 70 - 99 mg/dL      Comment: Glucose reference range applies only to samples taken after fasting for at least 8 hours.  Basic metabolic panel     Status: Abnormal    Collection Time: 08/13/22  4:30 AM  Result Value Ref Range  Sodium 138 135 - 145 mmol/L    Potassium 3.5 3.5 - 5.1 mmol/L    Chloride 111 98 - 111 mmol/L    CO2 19 (L) 22 - 32 mmol/L    Glucose, Bld 142 (H) 70 - 99 mg/dL      Comment: Glucose reference range applies only to samples taken after fasting for at least 8 hours.    BUN 28 (H) 8 - 23 mg/dL    Creatinine, Ser 1.71 (H) 0.44 - 1.00 mg/dL    Calcium 8.1 (L) 8.9 - 10.3 mg/dL    GFR, Estimated 30 (L) >60 mL/min      Comment: (NOTE) Calculated using the CKD-EPI Creatinine Equation (2021)      Anion gap 8 5 - 15      Comment: Performed at [facility], [place] [address] [place], [place] [address]  Magnesium     Status: None    Collection Time: 08/13/22  4:30 AM  Result Value Ref Range    Magnesium 2.0 1.7 - 2.4 mg/dL      Comment: Performed at [facility], [place] [address] [place], [place] [address]      Imaging Results (Last 48 hours)  No results found.         Blood pressure (!) 182/80, pulse 74, temperature (!) 97.4 F (36.3 C), temperature source Oral, resp. rate 18, height 5\' 2"  (1.575 m), weight 63.4 kg, SpO2 98 %.   Medical Problem List and Plan: 1. Functional deficits secondary to left lateral medullary infarction likely secondary to small vessel disease as well as history of CVA 2019 with left-sided residual  weakness             -patient may  shower             -ELOS/Goals: 14-20 days min A 2.  Antithrombotics: -DVT/anticoagulation:  Pharmaceutical: Lovenox             -antiplatelet therapy: Aspirin 81 mg daily and Plavix 75 mg day x 3 weeks then aspirin alone 3. Pain Management: Oxycodone as needed 4. Mood/Behavior/Sleep: Zoloft 25 mg daily, trazodone as needed             -antipsychotic agents: N/A 5. Neuropsych/cognition: This patient is? capable of making decisions on her own behalf. 6. Skin/Wound Care: Routine skin checks 7. Fluids/Electrolytes/Nutrition: Routine in and outs with follow-up chemistries 8.  Dysphagia.  Dysphagia #3 nectar thick liquids.  Follow-up speech therapy 9.  Hypertension.  Hydralazine 50 mg every 8 hours.  Losartan recently held due to AKI. Daughter notes BP been the best controlled in a long time.  10.  Acute urinary retention.  Foley removed 2/3 but failed voiding trial therefore replaced 2/4.  Patient with recent UTI we will discontinue Foley tube and work with voiding trial- will add Flomax 0.4 mg q supper- as well.  11.  AKI on CKD stage III.  Baseline creatinine 1.9-2.0.  Latest creatinine 1.71.  Losartan held.  Follow-up chemistries 12.  Diabetes mellitus.  Hemoglobin A1c 7.8.  SSI.  Previously patient had declined initiation of any diabetic medications per documentation.  Will follow-up with family. 13.  History of right ankle fracture.  Patient does wear an ankle stabilizing brace due to previous ankle fx. . 14.  Hyperlipidemia.  Zetia/fenofibrate. 15.  Constipation.  MiraLAX twice daily, Senokot 2 tabs at bedtime as needed.  Hold for loose stools. 16. Spasticity with forming contracture LUE as wlel as L PF contracture-  will order PRAFO at night as well as suggest Botox for LUE- has decent LUE strength.      I have personally performed a face to face diagnostic evaluation of this patient and formulated the key components of the plan.  Additionally, I have  personally reviewed laboratory data, imaging studies, as well as relevant notes and concur with the physician assistant's documentation above.   The patient's status has not changed from the original H&P.  Any changes in documentation from the acute care chart have been noted above.     Lavon Paganini Angiulli, PA-C 08/13/2022

## 2022-08-13 NOTE — Discharge Summary (Signed)
Physician Discharge Summary  [name] SNK:[id number] DOB: [birth year] DOA: 08/07/2022  PCP: Lauree Chandler, NP  Admit date: 08/07/2022 Discharge date: 08/13/2022  Admitted From: Home Disposition: CIR   Recommendations for Outpatient Follow-up:  Follow up with PCP in 1-2 weeks Please obtain BMP/CBC in one week your next doctors visit.  Aspirin and Plavix total of 21 days followed by aspirin alone.  Follow-up outpatient neurology next 3-4 weeks Hydralazine 50 mg 3 times daily added.  Continue home losartan but periodically check blood work and blood pressure in case if it needs to be adjusted Fenofibrate increased 260 mg daily Foley catheter in place, reassess for removal in next 5 to 7 days.  She failed voiding trial here therefore Foley had to be replaced again   Discharge Condition: Stable CODE STATUS: Full code Diet recommendation: Heart healthy diabetic  Brief/Interim Summary: 77 year old with history of CVA in 2019, HTN, HLD, DM 2, CKD stage 3aI, recent UTI brought to the hospital for increasing slurred speech and some difficulty swallowing.  At baseline patient does have left-sided weakness.  About a week ago she came to the ER with similar complaints, CT of the head was negative and UA showed possible UTI therefore discharged on antibiotics.  During this admission CT of the head was negative but MRI showed acute left lateral medullary infarct.  Neurology team was consulted.  It was decided for patient to be on aspirin and Plavix for 3 weeks followed by aspirin alone.  PT/OT recommended CIR therefore inpatient rehab team was consulted.  Due to dysphagia she was seen by speech and swallow therapy and deemed to be a moderate risk therefore their service will continue working with the patient.  Currently awaiting CIR placement.  Rest of the recommendations as stated above.     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  PT/OT recommended CIR, this is approved. - A1c-7.8, lipid panel-100 - Speech and swallow elevation performed and she is deemed to be moderate risk for aspiration.  Speech therapist will continue working with the patient.   Acute urinary retention - Foley removed 2/3 but failed voiding trial therefore replaced 2/4.  Reassess for removal in next 5 days   AKI on CKD 3a - This has resolved, creatinine around baseline of 1.7.  Losartan resumed but continue to monitor this.   Renal stone - Nonobstructive.  Renal function appears to be normal.  Advised oral hydration   Hyperlipidemia -Lipid panel LDL 100 - On home Zetia and Tricor   Essential hypertension, uncontrolled - On hydralazine 50 mg 3 times daily.  Losartan 25 mg daily.   Diabetes mellitus type 2 -Sliding scale and Accu-Cheks.  Previously patient has declined initiation of any diabetic medications per documentation.  A1c 7.8     Hypokalemia -Replete as needed   Recent urinary tract infection - This was recently diagnosed outpatient and prescribed Keflex but patient developed allergic reaction to it?Marland Kitchen  At this time she remains asymptomatic therefore no further treatment   Consultations: Neurology Inpatient rehab  Subjective: No complaints.  Sitting up in the side of the bed.  Daughter at bedside.  Discharge Exam: Vitals:   08/12/22 2019 08/13/22 0441  BP: (!) 148/55 (!) 182/80  Pulse: 71 74  Resp: 20 18  Temp: 97.7 F (36.5 C) (!) 97.4 F (36.3 C)  SpO2: 99% 98%   Vitals:   08/12/22 0529 08/12/22 1330 08/12/22 2019 08/13/22 0441  BP: (!) 187/72 (!)  154/72 (!) 148/55 (!) 182/80  Pulse: 72 72 71 74  Resp: 14 16 20 18   Temp: 98 F (36.7 C) (!) 97.5 F (36.4 C) 97.7 F (36.5 C) (!) 97.4 F (36.3 C)  TempSrc: Oral Oral Oral Oral  SpO2: 98% 100% 99% 98%  Weight:      Height:        General: Pt is alert, awake, not in acute distress Cardiovascular: RRR, S1/S2 +, no rubs, no gallops Respiratory: CTA bilaterally, no wheezing, no rhonchi Abdominal: Soft, NT, ND, bowel sounds + Extremities: no edema, no cyanosis Has chronic weakness of right upper and lower extremity. Foley catheter in place  Discharge Instructions  Discharge Instructions     Ambulatory referral to Neurology   Complete by: As directed    Follow up with stroke clinic NP (Jessica Vanschaick or Cecille Rubin, if both not available, consider Zachery Dauer, or [place]) at [facility] in about 4 weeks. Thanks.      Allergies as of 08/13/2022       Reactions   Cephalexin Diarrhea, Nausea And Vomiting   Clonidine Anaphylaxis, Swelling, Other (See Comments)   Tongue swelling   Norvasc [amlodipine Besylate] Swelling   Edema and TONGUE swelling   Lactose Intolerance (gi) Diarrhea   Latex Other (See Comments)   Skin blisters   Lisinopril Cough   Morphine And Related Itching, Other (See Comments)   Throat itched and became scratchy   Tape Itching, Other (See Comments)   Some tapes irritate the skin and others don't (redness/itchiness)   Amlodipine Anxiety, Nausea Only, Swelling   Codeine Itching, Rash   Crestor [rosuvastatin] Rash   Red and flushed in her face        Medication List     STOP taking these medications    cephALEXin 500 MG capsule Commonly known as: KEFLEX   ketoconazole 2 % cream Commonly known as: NIZORAL       TAKE these medications    Accu-Chek Guide Control Liqd Use in Checking blood sugar twice daily. Dx: E11.22   Accu-Chek Guide Me w/Device Kit Use to test blood sugar twice daily. Dx: E11.22   Accu-Chek Guide test strip Generic  drug: glucose blood Use to check blood sugar twice daily. Dx: E11.22   Accu-Chek Softclix Lancets lancets Use to test blood sugar twice daily. Dx: E11.22   acetaminophen 650 MG CR tablet Commonly known as: RA Acetaminophen Take 1 tablet (650 mg total) by mouth every 8 (eight) hours as needed for pain.   AMBULATORY NON FORMULARY MEDICATION Take 1 spray by mouth See admin instructions. Medication Name: "Anxious Moments" oral spray- Spray into the mouth at bedtime as directed   aspirin EC 81 MG tablet Take 1 tablet (81 mg total) by mouth daily. Swallow whole.   cholecalciferol 25 MCG (1000 UNIT) tablet Commonly known as: VITAMIN D3 Take 1,000 Units by mouth 2 (two) times a week.   clopidogrel 75 MG tablet Commonly known as: PLAVIX Take 1 tablet (75 mg total) by mouth daily for 18 days.   ezetimibe 10 MG tablet Commonly known  as: Zetia Take 1 tablet (10 mg total) by mouth daily.   fenofibrate 160 MG tablet Take 1 tablet (160 mg total) by mouth daily. What changed:  medication strength how much to take   hydrALAZINE 50 MG tablet Commonly known as: APRESOLINE Take 0.5 tablets (25 mg total) by mouth every 8 (eight) hours.   loratadine 10 MG tablet Commonly known as: CLARITIN Take 1 tablet (10 mg total) by mouth daily.   losartan 25 MG tablet Commonly known as: COZAAR TAKE 1 TABLET BY MOUTH TWICE A DAY What changed: when to take this   PROBIOTIC DAILY PO Take by mouth.   senna-docusate 8.6-50 MG tablet Commonly known as: Senokot-S Take 2 tablets by mouth at bedtime as needed for moderate constipation.   sertraline 25 MG tablet Commonly known as: ZOLOFT Take 1 tablet (25 mg total) by mouth daily. Diagnosis Association: Anxiety (F41.9)        Follow-up Information     [facility]. Schedule an appointment as soon as possible for a visit in 1 month(s).   Specialty: Neurology Why: stroke clinic Contact information: [facility] [phone]        Lauree Chandler, NP Follow up in 1 week(s).   Specialty: Geriatric Medicine Contact information: [address] [place] [address] [phone]                Allergies  Allergen Reactions   Cephalexin Diarrhea and Nausea And Vomiting   Clonidine Anaphylaxis, Swelling and Other (See Comments)    Tongue swelling   Norvasc [Amlodipine Besylate] Swelling    Edema and TONGUE swelling    Lactose Intolerance (Gi) Diarrhea   Latex Other (See Comments)    Skin blisters   Lisinopril Cough   Morphine And Related Itching and Other (See Comments)    Throat itched and became scratchy   Tape Itching and Other (See Comments)    Some tapes irritate the skin and others don't (redness/itchiness)   Amlodipine Anxiety, Nausea Only and Swelling   Codeine Itching and Rash   Crestor [Rosuvastatin] Rash    Red and flushed in her face    You were cared for by a hospitalist during your hospital stay. If you have any questions about your discharge medications or the care you received while you were in the hospital after you are discharged, you can call the unit and asked to speak with the hospitalist on call if the hospitalist that took care of you is not available. Once you are discharged, your primary care physician will handle any further medical issues. Please note that no refills for any discharge medications will be authorized once you are discharged, as it is imperative that you return to your primary care physician (or establish a relationship with a primary care physician if you do not have one) for your aftercare needs so that they can reassess your need for medications and monitor your lab values.   Procedures/Studies: DG Swallowing Func-Speech Pathology  Result Date: 08/08/2022 Table formatting from the original result was not included. Objective Swallowing Evaluation: Type of Study: Bedside Swallow Evaluation   Patient Details Name: ZYKERRIA TANTON MRN: [id number] Date of Birth: April 28, 1946 Today's Date: 08/08/2022 Time: SLP Start Time (ACUTE ONLY): 1400 -SLP Stop Time (ACUTE ONLY): 1446 SLP Time Calculation (min) (ACUTE ONLY): 46 min Past Medical History: Past Medical History: Diagnosis Date  Allergy   Arthritis   Broken ankle  Cataract   Diabetes mellitus without complication ([place])   History of colonoscopy   Hypertension   Macular degeneration syndrome   with edema---being treated.   Stage 4 chronic kidney disease ([place])   Stroke ([place]) 2019 Past Surgical History: Past Surgical History: Procedure Laterality Date  callous removal  Left 2017  located on 1st digit on L foot 2/2 diabetes  EYE SURGERY   HPI: 77 yo female adm to [facility] with several days of lethargy - treated for UTI, readmitted after found to have drooling, left sided weakness and dysphagia.  Medical history significant of CVA w/ left side residuals, HTN, HLD, DM2, CKD4. 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   Pt passed yale, but noted to have ongoing symptoms of dysphagia.  Prior MOCA score 29/30 when pt had CVA 2019.  Subjective: pt awake in bed, dtr present  Recommendations for follow up therapy are one component of a multi-disciplinary discharge planning process, led by the attending physician.  Recommendations may be updated based on patient status, additional functional criteria and insurance authorization. Assessment / Plan / Recommendation   08/08/2022   8:27 PM Clinical Impressions Clinical Impression Patient presents with moderate oropharyngeal dysphagia - with sensorimotor deficits.  She demonstrates moderately impaired oral control -most notably with thin and cracker boluses resulting in premature spillage of barium into pharynx and inconsistently oral retention.  Vertical mastication pattern with solid - at  anterior oral cavity with partial cracker spilling to vallecular space poorly controlled. She required suctioning to remove portion of solid cracker that was retained left anterior oral cavity. Pharyngeal swallow with cracker bolus was not elicited until after 3rd bolus spilled into pharynx - at vallecular space - requiring 20 seconds with retained solid in pharynx before swallow was finally triggered. Pharyngeal swallow is severely delayed as barium pools at pyriform sinus with pt frequently requiring verbal cue to swallow - most notably with liquids.  SLP counted "1,2,3.. Swallow" to improve pt's swallow timing.  Audible aspiration occured with nectar when pt was swallowing tablet due to impaired coordination resulting in prolonged oral transiting.  In addition, impaired adequacy of pharyngeal contraction resulted in pharyngeal retentin of nectar that spilled into airway postswallow.  Pt cleared large amount of nectar aspiration with reflexive cough but minimal amount remained.  Various postures including chin tuck and head turn were not helpful.  Due to pt's severe pharyngeal initiation delay, recommend nectar liquids and tsps of thin.  Pt will benefit from oral suction before, during and after meals.  SLP modified diet to changed diet order to dys1/nectar - and educated pt/daughter to findings, recommendations. Will follow for dysphagia management and treatment. With current level of dysphagia, adequacy of po intake for hydration and nutrition is a concern.  Family reports pt has always "swallowed slowly" causing SLP to question if pt may have undiagnosed dysphagia since her 2019 CVA.  SLP Visit Diagnosis Dysphagia, oropharyngeal phase (R13.12);Dysphagia, unspecified (R13.10) Impact on safety and function Moderate aspiration risk;Severe aspiration risk;Risk for inadequate nutrition/hydration     08/08/2022   8:27 PM Treatment Recommendations Treatment Recommendations Therapy as outlined in treatment plan below      08/08/2022   8:48 PM Prognosis Prognosis for Safe Diet Advancement Fair Barriers to Reach Goals Severity of deficits   08/08/2022   8:27 PM Diet Recommendations SLP Diet Recommendations Dysphagia 1 (Puree) solids;Nectar thick liquid  Liquid Administration via Spoon;Cup;Straw Medication Administration Whole meds with puree Compensations Small sips/bites;Slow rate;Other (Comment) Postural Changes Seated upright at 90 degrees;Remain semi-upright after after feeds/meals (Comment)     08/08/2022   8:27 PM Other Recommendations Oral Care Recommendations Oral care before and after PO;Other (Comment) Other Recommendations Have oral suction available;Order thickener from pharmacy Follow Up Recommendations Acute inpatient rehab (3hours/day) Functional Status Assessment Patient has had a recent decline in their functional status and demonstrates the ability to make significant improvements in function in a reasonable and predictable amount of time.   08/08/2022   8:27 PM Frequency and Duration  Speech Therapy Frequency (ACUTE ONLY) min 2x/week Treatment Duration 2 weeks     08/08/2022   8:20 PM Oral Phase Oral Phase Impaired Oral - Honey Teaspoon Delayed oral transit;Premature spillage;Decreased bolus cohesion;Weak lingual manipulation Oral - Nectar Teaspoon Delayed oral transit;Premature spillage;Decreased bolus cohesion;Weak lingual manipulation;Left anterior bolus loss Oral - Nectar Cup Premature spillage;Decreased bolus cohesion;Weak lingual manipulation;Delayed oral transit Oral - Nectar Straw Weak lingual manipulation;Premature spillage;Decreased bolus cohesion;Delayed oral transit Oral - Thin Teaspoon Premature spillage;Weak lingual manipulation;Left anterior bolus loss;Decreased bolus cohesion Oral - Thin Cup Premature spillage;Weak lingual manipulation;Decreased bolus cohesion;Lingual/palatal residue Oral - Thin Straw Premature spillage;Weak lingual manipulation;Left anterior bolus loss;Decreased bolus cohesion;Lingual/palatal  residue Oral - Puree Lingual pumping;Weak lingual manipulation;Delayed oral transit;Premature spillage;Lingual/palatal residue Oral - Mech Soft Decreased bolus cohesion;Premature spillage;Impaired mastication;Weak lingual manipulation;Reduced posterior propulsion;Pocketing in anterior sulcus;Lingual/palatal residue Oral - Pill Premature spillage;Decreased bolus cohesion;Reduced posterior propulsion;Weak lingual manipulation;Lingual/palatal residue Oral Phase - Comment oral suction provided before, during and after MBS, pt allows secretions to pool in pharynx; oral transiting delays up to 20 seconds - or as little as 4 seconds, oral retention spills into pharynx post-swallow    08/08/2022   8:23 PM Pharyngeal Phase Pharyngeal Phase Impaired Pharyngeal- Honey Teaspoon Delayed swallow initiation-pyriform sinuses;Delayed swallow initiation-vallecula Pharyngeal Material does not enter airway Pharyngeal- Nectar Teaspoon Delayed swallow initiation-pyriform sinuses Pharyngeal Material does not enter airway Pharyngeal- Nectar Cup Delayed swallow initiation-pyriform sinuses Pharyngeal Material does not enter airway Pharyngeal- Nectar Straw Delayed swallow initiation-pyriform sinuses Pharyngeal Material does not enter airway Pharyngeal- Thin Teaspoon Penetration/Aspiration during swallow;Delayed swallow initiation-pyriform sinuses Pharyngeal Material enters airway, remains ABOVE vocal cords and not ejected out Pharyngeal- Thin Cup Delayed swallow initiation-pyriform sinuses;Penetration/Aspiration during swallow Pharyngeal Material enters airway, remains ABOVE vocal cords and not ejected out Pharyngeal- Thin Straw Delayed swallow initiation-pyriform sinuses;Penetration/Aspiration during swallow Pharyngeal Material enters airway, remains ABOVE vocal cords and not ejected out Pharyngeal- Puree Delayed swallow initiation-vallecula Pharyngeal Material does not enter airway Pharyngeal- Mechanical Soft Delayed swallow  initiation-pyriform sinuses;Delayed swallow initiation-vallecula Pharyngeal Material does not enter airway Pharyngeal- Pill Penetration/Apiration after swallow;Reduced laryngeal elevation;Reduced airway/laryngeal closure;Moderate aspiration;Pharyngeal residue - pyriform Pharyngeal Material enters airway, passes BELOW cords and not ejected out despite cough attempt by patient Pharyngeal Comment Pharyngeal swallow severely delayed - triggering at pyriform sinus consistently requring counting cues to trigger swallow, aspiration occured with nectar mixed with secretions due to impaired coordination with swallow resulting in prolonged oral transiting - impaired adequacy of pharyngeal contraction with retention of nectar spilling into airway postswallow - causing reflexive cough.  Pt cleared large amount of aspiration with reflexive cough but minimal amount remained.    08/08/2022   8:27 PM Cervical Esophageal Phase  Cervical Esophageal Phase Impaired Barium tablet appeared to stall at distal esophagus, more liquids aided clearance. Pt also appeared with dilated esophagus with barium retention that appeared mixed with secretions.  Radiologist was not  present and MBS does not diagnose below the UES.  Suspect component of esophageal motility deficits. Kathleen Lime, MS [facility] SLP Acute Rehab Services Office [phone] Macario Golds 08/08/2022, 8:51 PM                     DG Abd 1 View  Result Date: 08/08/2022 CLINICAL DATA:  Abdominal distension. EXAM: ABDOMEN - 1 VIEW COMPARISON:  Pelvic CT 09/21/2018.  Chest radiographs 08/07/2022. FINDINGS: 1054 hours. Two supine views are submitted. The bowel gas pattern is normal. No supine evidence of free intraperitoneal air. Possible right renal calculus. There are scattered vascular calcifications. The bladder appears mildly distended. No acute osseous findings are seen. There is telemetry leads overlie the abdomen and lower chest. IMPRESSION: No evidence of bowel obstruction or  other acute findings. Possible right renal calculus and bladder distension. Electronically Signed   By: Richardean Sale M.D.   On: 08/08/2022 11:12   [name] CONTRAST  Result Date: 08/07/2022 CLINICAL DATA:  Transient ischemic attack (TIA). Left facial droop. Nausea and vomiting. EXAM: MRI HEAD WITHOUT CONTRAST TECHNIQUE: Multiplanar, multiecho pulse sequences of the brain and surrounding structures were obtained without intravenous contrast. COMPARISON:  Head CT 08/07/2022 and MRI 02/24/2018 FINDINGS: Brain: There is a 4 mm acute infarct in the upper left medulla. No mass, midline shift, or extra-axial fluid collection is identified. A moderate-sized chronic right parietal infarct and chronic bilateral corona radiata/basal ganglia lacunar infarcts are again noted. There are chronic blood products associated with an old right basal ganglia infarct. T2 hyperintensities elsewhere in the cerebral white matter bilaterally have progressed from the prior MRI and are nonspecific but compatible with chronic small vessel ischemic disease. Chronic bilateral cerebellar and pontine infarcts are largely new from 2019. There is moderate cerebral atrophy. Vascular: Major intracranial vascular flow voids are preserved. Skull and upper cervical spine: Unremarkable bone marrow signal. Sinuses/Orbits: Bilateral cataract extraction. Paranasal sinuses and mastoid air cells are clear. Other: None. IMPRESSION: 1. 4 mm acute left medullary infarct. 2. Progressive chronic small vessel ischemic disease since 2019 with multiple chronic infarcts as above. Electronically Signed   By: Logan Bores M.D.   On: 08/07/2022 12:01   CT HEAD WO CONTRAST (5MM)  Result Date: 08/07/2022 CLINICAL DATA:  Transient ischemic attack.  Altered mental status. EXAM: CT HEAD WITHOUT CONTRAST TECHNIQUE: Contiguous axial images were obtained from the base of the skull through the vertex without intravenous contrast. RADIATION DOSE REDUCTION: This exam  was performed according to the departmental dose-optimization program which includes automated exposure control, adjustment of the mA and/or kV according to patient size and/or use of iterative reconstruction technique. COMPARISON:  07/30/2022 FINDINGS: Brain: There is encephalomalacia of the RIGHT posterior parietal lobe. There are remote infarcts of the RIGHT corona radiata and bilateral basal ganglia. Significant periventricular white matter changes are present. There is central and cortical atrophy. There is a remote RIGHT cerebellar infarct. There is no intra or extra-axial fluid collection or mass lesion. The basilar cisterns and ventricles have a normal appearance. There is no CT evidence for acute infarction or hemorrhage. Vascular: There is dense atherosclerotic calcification of the internal carotid arteries. No hyperdense vessels. Skull: Normal. Negative for fracture or focal lesion. Sinuses/Orbits: No acute finding. Other: None. IMPRESSION: 1. Multiple remote infarcts. 2. Atrophy and small vessel disease. 3. No evidence for acute intracranial abnormality. Electronically Signed   By: Nolon Nations M.D.   On: 08/07/2022 10:50   DG Chest 2 View  Result Date: 08/07/2022 CLINICAL DATA:  Weakness. EXAM: CHEST - 2 VIEW COMPARISON:  09/22/2018 FINDINGS: Coarse lung markings are suggestive for chronic changes. No evidence for airspace disease or pulmonary edema. Heart and mediastinum are within normal limits. Atherosclerotic calcifications at the aortic arch. Irregularity of the posterior left seventh rib likely represents an old fracture. No pleural effusions. IMPRESSION: Chronic lung changes without acute findings. Electronically Signed   By: Markus Daft M.D.   On: 08/07/2022 10:06   CT HEAD WO CONTRAST  Result Date: 07/30/2022 CLINICAL DATA:  Generalized weakness EXAM: CT HEAD WITHOUT CONTRAST TECHNIQUE: Contiguous axial images were obtained from the base of the skull through the vertex without  intravenous contrast. RADIATION DOSE REDUCTION: This exam was performed according to the departmental dose-optimization program which includes automated exposure control, adjustment of the mA and/or kV according to patient size and/or use of iterative reconstruction technique. COMPARISON:  No prior CT head, correlation is made with MRI head 02/24/2018 FINDINGS: Brain: No evidence of acute infarction, hemorrhage, mass, mass effect, or midline shift. No hydrocephalus or extra-axial fluid collection. Encephalomalacia in the right parietal lobe, right corona radiata lacunar infarct, and bilateral basal ganglia lacunar infarcts, which appear similar to the prior MRI when accounting for differences in technique. Additional remote infarct in the right cerebellum appears new compared to 2019. Periventricular white matter changes, likely the sequela of chronic small vessel ischemic disease. Vascular: No hyperdense vessel. Atherosclerotic calcifications in the intracranial carotid and vertebral arteries. Skull: Negative for fracture or focal lesion. Sinuses/Orbits: Clear paranasal sinuses. Status post bilateral lens replacements. Other: The mastoid air cells are well aerated. IMPRESSION: No acute intracranial process. Electronically Signed   By: Merilyn Baba M.D.   On: 07/30/2022 16:31     The results of significant diagnostics from this hospitalization (including imaging, microbiology, ancillary and laboratory) are listed below for reference.     Microbiology: Recent Results (from the past 240 hour(s))  Resp panel by RT-PCR (RSV, Flu A&B, Covid) Anterior Nasal Swab     Status: None   Collection Time: 08/07/22  9:11 AM   Specimen: Anterior Nasal Swab  Result Value Ref Range Status   SARS Coronavirus 2 by RT PCR NEGATIVE NEGATIVE Final    Comment: (NOTE) SARS-CoV-2 target nucleic acids are NOT DETECTED.  The SARS-CoV-2 RNA is generally detectable in upper respiratory specimens during the acute phase of  infection. The lowest concentration of SARS-CoV-2 viral copies this assay can detect is 138 copies/mL. A negative result does not preclude SARS-Cov-2 infection and should not be used as the sole basis for treatment or other patient management decisions. A negative result may occur with  improper specimen collection/handling, submission of specimen other than nasopharyngeal swab, presence of viral mutation(s) within the areas targeted by this assay, and inadequate number of viral copies(<138 copies/mL). A negative result must be combined with clinical observations, patient history, and epidemiological information. The expected result is Negative.  Fact Sheet for Patients:  EntrepreneurPulse.com.au  Fact Sheet for Healthcare Providers:  IncredibleEmployment.be  This test is no t yet approved or cleared by the [place] FDA and  has been authorized for detection and/or diagnosis of SARS-CoV-2 by FDA under an Emergency Use Authorization (EUA). This EUA will remain  in effect (meaning this test can be used) for the duration of the COVID-19 declaration under Section 564(b)(1) of the Act, 21 U.S.C.section 360bbb-3(b)(1), unless the authorization is terminated  or revoked sooner.       Influenza A by  PCR NEGATIVE NEGATIVE Final   Influenza B by PCR NEGATIVE NEGATIVE Final    Comment: (NOTE) The Xpert Xpress SARS-CoV-2/FLU/RSV plus assay is intended as an aid in the diagnosis of influenza from Nasopharyngeal swab specimens and should not be used as a sole basis for treatment. Nasal washings and aspirates are unacceptable for Xpert Xpress SARS-CoV-2/FLU/RSV testing.  Fact Sheet for Patients: EntrepreneurPulse.com.au  Fact Sheet for Healthcare Providers: IncredibleEmployment.be  This test is not yet approved or cleared by the [place] FDA and has been authorized for detection and/or diagnosis of SARS-CoV-2  by FDA under an Emergency Use Authorization (EUA). This EUA will remain in effect (meaning this test can be used) for the duration of the COVID-19 declaration under Section 564(b)(1) of the Act, 21 U.S.C. section 360bbb-3(b)(1), unless the authorization is terminated or revoked.     Resp Syncytial Virus by PCR NEGATIVE NEGATIVE Final    Comment: (NOTE) Fact Sheet for Patients: EntrepreneurPulse.com.au  Fact Sheet for Healthcare Providers: IncredibleEmployment.be  This test is not yet approved or cleared by the [place] FDA and has been authorized for detection and/or diagnosis of SARS-CoV-2 by FDA under an Emergency Use Authorization (EUA). This EUA will remain in effect (meaning this test can be used) for the duration of the COVID-19 declaration under Section 564(b)(1) of the Act, 21 U.S.C. section 360bbb-3(b)(1), unless the authorization is terminated or revoked.  Performed at [facility], [place] [address] [place], [place] [address]      Labs: BNP (last 3 results) No results for input(s): "BNP" in the last 8760 hours. Basic Metabolic Panel: Recent Labs  Lab 08/09/22 0418 08/10/22 0459 08/11/22 0425 08/12/22 0427 08/13/22 0430  NA 144 140 138 140 138  K 3.2* 3.8 3.7 3.7 3.5  CL 115* 113* 108 114* 111  CO2 19* 21* 20* 19* 19*  GLUCOSE 98 115* 136* 163* 142*  BUN 27* 22 23 31* 28*  CREATININE 1.51* 1.65* 1.61* 2.01* 1.71*  CALCIUM 8.4* 8.4* 8.4* 8.5* 8.1*  MG 2.0 2.4 2.1 2.3 2.0   Liver Function Tests: Recent Labs  Lab 08/07/22 0822  AST 20  ALT 23  ALKPHOS 114  BILITOT 0.9  PROT 7.0  ALBUMIN 3.4*   No results for input(s): "LIPASE", "AMYLASE" in the last 168 hours. No results for input(s): "AMMONIA" in the last 168 hours. CBC: Recent Labs  Lab 08/07/22 0822 08/10/22 0459  WBC 13.7* 8.4  NEUTROABS 10.8*  --   HGB 15.0 12.6  HCT 46.8* 39.7  MCV 83.1 84.5  PLT 363 277   Cardiac  Enzymes: No results for input(s): "CKTOTAL", "CKMB", "CKMBINDEX", "TROPONINI" in the last 168 hours. BNP: Invalid input(s): "POCBNP" CBG: Recent Labs  Lab 08/12/22 0730 08/12/22 1118 08/12/22 1631 08/12/22 2020 08/13/22 0729  GLUCAP 158* 179* 146* 191* 134*   D-Dimer No results for input(s): "DDIMER" in the last 72 hours. Hgb A1c No results for input(s): "HGBA1C" in the last 72 hours. Lipid Profile No results for input(s): "CHOL", "HDL", "LDLCALC", "TRIG", "CHOLHDL", "LDLDIRECT" in the last 72 hours. Thyroid function studies No results for input(s): "TSH", "T4TOTAL", "T3FREE", "THYROIDAB" in the last 72 hours.  Invalid input(s): "FREET3" Anemia work up No results for input(s): "VITAMINB12", "FOLATE", "FERRITIN", "TIBC", "IRON", "RETICCTPCT" in the last 72 hours. Urinalysis    Component Value Date/Time   COLORURINE YELLOW 08/07/2022 0941   APPEARANCEUR CLEAR 08/07/2022 0941   LABSPEC 1.013 08/07/2022 0941   PHURINE 6.0 08/07/2022 0941   GLUCOSEU >=500 (A)  08/07/2022 [place] 12/21/2019 0946   HGBUR NEGATIVE 08/07/2022 0941   BILIRUBINUR NEGATIVE 08/07/2022 0941   KETONESUR NEGATIVE 08/07/2022 0941   PROTEINUR >=300 (A) 08/07/2022 0941   UROBILINOGEN 0.2 12/21/2019 0946   NITRITE NEGATIVE 08/07/2022 0941   LEUKOCYTESUR NEGATIVE 08/07/2022 0941   Sepsis Labs Recent Labs  Lab 08/07/22 0822 08/10/22 0459  WBC 13.7* 8.4   Microbiology Recent Results (from the past 240 hour(s))  Resp panel by RT-PCR (RSV, Flu A&B, Covid) Anterior Nasal Swab     Status: None   Collection Time: 08/07/22  9:11 AM   Specimen: Anterior Nasal Swab  Result Value Ref Range Status   SARS Coronavirus 2 by RT PCR NEGATIVE NEGATIVE Final    Comment: (NOTE) SARS-CoV-2 target nucleic acids are NOT DETECTED.  The SARS-CoV-2 RNA is generally detectable in upper respiratory specimens during the acute phase of infection. The lowest concentration of SARS-CoV-2 viral copies this  assay can detect is 138 copies/mL. A negative result does not preclude SARS-Cov-2 infection and should not be used as the sole basis for treatment or other patient management decisions. A negative result may occur with  improper specimen collection/handling, submission of specimen other than nasopharyngeal swab, presence of viral mutation(s) within the areas targeted by this assay, and inadequate number of viral copies(<138 copies/mL). A negative result must be combined with clinical observations, patient history, and epidemiological information. The expected result is Negative.  Fact Sheet for Patients:  EntrepreneurPulse.com.au  Fact Sheet for Healthcare Providers:  IncredibleEmployment.be  This test is no t yet approved or cleared by the [place] FDA and  has been authorized for detection and/or diagnosis of SARS-CoV-2 by FDA under an Emergency Use Authorization (EUA). This EUA will remain  in effect (meaning this test can be used) for the duration of the COVID-19 declaration under Section 564(b)(1) of the Act, 21 U.S.C.section 360bbb-3(b)(1), unless the authorization is terminated  or revoked sooner.       Influenza A by PCR NEGATIVE NEGATIVE Final   Influenza B by PCR NEGATIVE NEGATIVE Final    Comment: (NOTE) The Xpert Xpress SARS-CoV-2/FLU/RSV plus assay is intended as an aid in the diagnosis of influenza from Nasopharyngeal swab specimens and should not be used as a sole basis for treatment. Nasal washings and aspirates are unacceptable for Xpert Xpress SARS-CoV-2/FLU/RSV testing.  Fact Sheet for Patients: EntrepreneurPulse.com.au  Fact Sheet for Healthcare Providers: IncredibleEmployment.be  This test is not yet approved or cleared by the [place] FDA and has been authorized for detection and/or diagnosis of SARS-CoV-2 by FDA under an Emergency Use Authorization (EUA). This EUA will  remain in effect (meaning this test can be used) for the duration of the COVID-19 declaration under Section 564(b)(1) of the Act, 21 U.S.C. section 360bbb-3(b)(1), unless the authorization is terminated or revoked.     Resp Syncytial Virus by PCR NEGATIVE NEGATIVE Final    Comment: (NOTE) Fact Sheet for Patients: EntrepreneurPulse.com.au  Fact Sheet for Healthcare Providers: IncredibleEmployment.be  This test is not yet approved or cleared by the [place] FDA and has been authorized for detection and/or diagnosis of SARS-CoV-2 by FDA under an Emergency Use Authorization (EUA). This EUA will remain in effect (meaning this test can be used) for the duration of the COVID-19 declaration under Section 564(b)(1) of the Act, 21 U.S.C. section 360bbb-3(b)(1), unless the authorization is terminated or revoked.  Performed at [facility], [place] [address], [place], [place] [address]  Time coordinating discharge:  I have spent 35 minutes face to face with the patient and on the ward discussing the patients care, assessment, plan and disposition with other care givers. >50% of the time was devoted counseling the patient about the risks and benefits of treatment/Discharge disposition and coordinating care.   SIGNED:   Damita Lack, MD  [facility] 08/13/2022, 8:59 AM   If 7PM-7AM, please contact night-coverage

## 2022-08-13 NOTE — Progress Notes (Signed)
Courtney Heys, MD  Physician Physical Medicine and Rehabilitation   PMR Pre-admission    Signed   Date of Service: 08/09/2022  8:10 AM  Related encounter: ED to Hosp-Admission (Discharged) from 08/07/2022 in [place]      Show:Clear all [x] Written[x] Templated[] Copied  Added by: [x] Cristina Gong, RN[x] Conetta, Kristyn H[x] Courtney Heys, MD  [] Hover for details PMR Admission Coordinator Pre-Admission Assessment   Patient: [name] is an 77 y.o., female MRN: [id number] DOB: [birth year] Height: 5\' 2"  (157.5 cm) Weight: 63.4 kg   Insurance Information HMO:     PPO:      PCP:      IPA:      80/20:     OTHER:  PRIMARY: [employer]      Policy#: [id number]      Subscriber: pt CM Name: Myriam Jacobson      Phone#: [phone] ext [phone]     Fax#: [phone] Authorization#: [id number] approved for 7 days, f/u with Edwena Felty at phone # [phone] x [phone], fax# [phone]       Benefits:  Phone #: [phone]     Name:  Eff. Date: 07/08/22     Deduct: none      Out of Pocket Max: $4000 CIR: $160 co pay per day days 1 until 10      SNF: no co pay per day days 1 until 20; $50 co pay per day days 21 until 100 Outpatient: $20 per visit     Co-Pay: visits per medical neccesity Home Health: 100%      Co-Pay: visits per medical neccesity DME: 80%     Co-Pay: 20% Providers: in network  SECONDARY: none      Policy#:      Phone#:    Development worker, community:       Phone#:    The Engineer, petroleum" for patients in Inpatient Rehabilitation Facilities with attached "Privacy Act [place] Records" was provided and verbally reviewed with: Family   Emergency Contact Information Contact Information       Name Relation Home Work [place] Daughter [phone]        Knies,[name] Other     [phone]         Current Medical History  Patient Admitting Diagnosis: CVA   History of  Present Illness: 77 year old female with history of CVA with left sided residuals, HTN, DM2, CKD 4 and right hip and ankle fracture. Presented on 08/07/22 to [facility] with slurred speech and difficulty swallowing. She was at baseline until 5 days prior to admit. Had become nausea and few episodes of vomiting, Recently treated for UTI. Had a possible allergic reaction to the antibiotic.    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 Neurology consulted. CT no acute finding but chronic right parietal infarct. MRA, 2 d echo and carotid dopplers not pursued per pt and family wishes. Lovenox for DVT prophylaxis. No antithrombotic pta , now on Asa and Clopidogrel for 3 weeks and the ASA alone. Hgb A1c 7.8. SSI and CBG monitors, DM education and follow up with PCP. To allow for permissive HTN. To begin Losartan. LDL 100 on Zetia and fenofibrate as at home for statin intolerant. MBS with SLP with severe pharyngeal initiation delay and nectar liquids and tsps of thins recommended.  Oral suction before and after meals. Dys 3 nectar diet. Whole meds with  puree and crush if pills large.    Hospital course with bouts of urinary retention foley maintained through 2/3 and removed but failed voiding trail and replaced 2/4. AKI on CKD stage IV creatinine increasing to 2.0 suspect due to urinary retention placed on gentle hydration and losartan held.    Complete NIHSS TOTAL: 6   Patient's medical record from [facility] has been reviewed by the rehabilitation admission coordinator and physician.   Past Medical History      Past Medical History:  Diagnosis Date   Allergy     Arthritis     Broken ankle     Cataract     Diabetes mellitus without complication ([place])     History of colonoscopy     Hypertension     Macular degeneration syndrome      with edema---being treated.    Stage 4 chronic kidney disease ([place])     Stroke ([place]) 2019     Has the patient had major surgery during 100 days prior to admission? No   Family History   family history includes COPD in her brother and father; Congestive Heart Failure in her father; Diabetes in her maternal aunt; Kidney disease in her mother.   Current Medications   Current Facility-Administered Medications:    acetaminophen (TYLENOL) tablet 650 mg, 650 mg, Oral, Q4H PRN **OR** acetaminophen (TYLENOL) 160 MG/5ML solution 650 mg, 650 mg, Per Tube, Q4H PRN **OR** acetaminophen (TYLENOL) suppository 650 mg, 650 mg, Rectal, Q4H PRN, Marylyn Ishihara, Tyrone A, DO   aspirin EC tablet 81 mg, 81 mg, Oral, Daily, Amin, Ankit Chirag, MD, 81 mg at 08/13/22 0936   Chlorhexidine Gluconate Cloth 2 % PADS 6 each, 6 each, Topical, Daily, Amin, Ankit Chirag, MD, 6 each at 08/10/22 1103   clopidogrel (PLAVIX) tablet 75 mg, 75 mg, Oral, Daily, Amin, Ankit Chirag, MD, 75 mg at 08/13/22 0936   enoxaparin (LOVENOX) injection 30 mg, 30 mg, Subcutaneous, Q24H, Rosalin Hawking, MD, 30 mg at 08/09/22 1336   ezetimibe (ZETIA) tablet 10 mg, 10 mg, Oral, Daily, Amin, Ankit Chirag, MD   fenofibrate tablet 160 mg, 160 mg, Oral, Daily, Amin, Ankit Chirag, MD   guaiFENesin (ROBITUSSIN) 100 MG/5ML liquid 5 mL, 5 mL, Oral, Q4H PRN, Amin, Ankit Chirag, MD   hydrALAZINE (APRESOLINE) injection 10 mg, 10 mg, Intravenous, Q4H PRN, Amin, Ankit Chirag, MD, 10 mg at 08/10/22 0811   hydrALAZINE (APRESOLINE) tablet 50 mg, 50 mg, Oral, Q8H, Amin, Ankit Chirag, MD, 50 mg at 08/13/22 0509   insulin aspart (novoLOG) injection 0-9 Units, 0-9 Units, Subcutaneous, TID WC, Amin, Ankit Chirag, MD, 3 Units at 08/11/22 1247   ipratropium-albuterol (DUONEB) 0.5-2.5 (3) MG/3ML nebulizer solution 3 mL, 3 mL, Nebulization, Q4H PRN, Amin, Ankit Chirag, MD   metoprolol tartrate (LOPRESSOR) injection 5 mg, 5 mg, Intravenous, Q4H PRN, Amin, Ankit Chirag, MD   ondansetron (ZOFRAN) injection 4 mg, 4 mg, Intravenous, Q6H PRN, Amin, Ankit Chirag, MD   Oral care mouth  rinse, 15 mL, Mouth Rinse, 4 times per day, Amin, Ankit Chirag, MD, 15 mL at 08/13/22 0742   Oral care mouth rinse, 15 mL, Mouth Rinse, PRN, Amin, Ankit Chirag, MD   oxyCODONE (Oxy IR/ROXICODONE) immediate release tablet 5 mg, 5 mg, Oral, Q4H PRN, Amin, Ankit Chirag, MD   polyethylene glycol (MIRALAX / GLYCOLAX) packet 17 g, 17 g, Oral, BID, Amin, Ankit Chirag, MD, 17 g at 08/13/22 0936   senna-docusate (Senokot-S) tablet 2 tablet, 2 tablet, Oral, QHS PRN,  Amin, Ankit Chirag, MD   sertraline (ZOLOFT) tablet 25 mg, 25 mg, Oral, Daily, Amin, Ankit Chirag, MD   traZODone (DESYREL) tablet 50 mg, 50 mg, Oral, QHS PRN, Amin, Jeanella Flattery, MD   Patients Current Diet:  Diet Order                  DIET DYS 3 Room service appropriate? Yes; Fluid consistency: Nectar Thick  Diet effective 1000                       Precautions / Restrictions Precautions Precautions: Fall Precaution Comments: L HEMI CVA 3 years ago Other Brace: R ankle stabilizing brace Restrictions Weight Bearing Restrictions: No RLE Weight Bearing: Weight bearing as tolerated LLE Weight Bearing: Weight bearing as tolerated Other Position/Activity Restrictions: Old L sided weakness from a prior CVA from ~3 years ago. History of R abkle fracture ~1-1.5 yr ago    Has the patient had 2 or more falls or a fall with injury in the past year? No   Prior Activity Level Limited Community (1-2x/wk): Has been wheelchair transfers and few pivotal steps for 1 1//2 years until OP therapy recently   Prior Functional Level Self Care: Did the patient need help bathing, dressing, using the toilet or eating? Needed some help   Indoor Mobility: Did the patient need assistance with walking from room to room (with or without device)? Needed some help   Stairs: Did the patient need assistance with internal or external stairs (with or without device)? Needed some help   Functional Cognition: Did the patient need help planning regular tasks  such as shopping or remembering to take medications? Needed some help   Patient Information Are you of Hispanic, Latino/a,or Spanish origin?: A. No, not of Hispanic, Latino/a, or Spanish origin, X. Patient unable to respond (not hispanic) What is your race?: A. White, X. Patient unable to respond Do you need or want an interpreter to communicate with a doctor or health care staff?: 9. Unable to respond (no)   Patient's Response To:  Health Literacy and Transportation Is the patient able to respond to health literacy and transportation needs?: No Health Literacy - How often do you need to have someone help you when you read instructions, pamphlets, or other written material from your doctor or pharmacy?: Patient unable to respond In the past 12 months, has lack of transportation kept you from medical appointments or from getting medications?: No In the past 12 months, has lack of transportation kept you from meetings, work, or from getting things needed for daily living?: No   Development worker, international aid / [place] Devices/Equipment: Wheelchair Home Equipment: Wheelchair - manual, Transport chair, BSC/3in1, Shower seat, Conservation officer, nature (2 wheels), Rollator (4 wheels), [place] - quad, [place] - single point, Hospital bed (plan for getting power wheelchair. Shower seat is "terrible" per daughter)   Prior Device Use: Indicate devices/aids used by the patient prior to current illness, exacerbation or injury? Manual wheelchair and Walker   Current Functional Level Cognition   Overall Cognitive Status: Within Functional Limits for tasks assessed Orientation Level: Oriented X4 General Comments: patient was alert and cooperative today.    Extremity Assessment (includes Sensation/Coordination)   Upper Extremity Assessment: RUE deficits/detail RUE Deficits / Details: R UE coordination impaired- noted tremors which pt states are new and dysmetric with finger to nose - with intermittent  overshooting. RUE Sensation:  (She denied having numbness and tingling of her RUE.) LUE Deficits /  Details: Required active assist for shoulder and elbow ROM, due to baseline weakness from a CVA. Elbow flexor hypertonia noted. Grip strength 3+/5, elbow flexion 2-/5, elbow extension 2-/5, shoulder flexion 2+/5 LUE Sensation:  (She denied having numbness and tingling of her LUE.)  Lower Extremity Assessment: RLE deficits/detail, LLE deficits/detail RLE Deficits / Details: 4-/5 throughout, except hip flexion 3/5 LLE Deficits / Details: 4-/5 throughout, except hip flexion 3/5     ADLs   Overall ADL's : Needs assistance/impaired Eating/Feeding: Set up, Sitting Eating/Feeding Details (indicate cue type and reason): based on clinical judgement Grooming: Wash/dry hands, Wash/dry face, Oral care, Brushing hair, Set up, Minimal assistance Grooming Details (indicate cue type and reason): min A to set up for RUE to complete Upper Body Bathing: Minimal assistance Upper Body Bathing Details (indicate cue type and reason): to wash R arm and under breasts. Lower Body Bathing: Moderate assistance Lower Body Bathing Details (indicate cue type and reason): able to wash tops of thighs with RUE. Upper Body Dressing : Moderate assistance Upper Body Dressing Details (indicate cue type and reason): to don gown Lower Body Dressing: Maximal assistance Toileting- Clothing Manipulation and Hygiene: Maximal assistance, Bed level Toileting - Clothing Manipulation Details (indicate cue type and reason): based on clinical judgement     Mobility   Overal bed mobility: Needs Assistance Bed Mobility: Supine to Sit Supine to sit: Max assist, +2 for physical assistance, +2 for safety/equipment Sit to supine: Total assist, +2 for physical assistance, +2 for safety/equipment General bed mobility comments: patient was up in personal transport chair today. patient asked to remain in chair to eat berries     Transfers    Overall transfer level: Needs assistance Equipment used: Rolling walker (2 wheels), 2 person hand held assist Transfers: Sit to/from Stand, Bed to chair/wheelchair/BSC Sit to Stand: Mod assist, Max assist, +2 physical assistance, +2 safety/equipment Bed to/from chair/wheelchair/BSC transfer type:: Lateral/scoot transfer Stand pivot transfers: Max assist, +2 physical assistance, +2 safety/equipment  Lateral/Scoot Transfers: +2 physical assistance, +2 safety/equipment, Total assist General transfer comment: bed pad used to scoot pt back to bed +2 total assist (pt was scooting toward foot plate after 10 minutes in recliner)     Ambulation / Gait / Stairs / Wheelchair Mobility         Posture / Balance Dynamic Sitting Balance Sitting balance - Comments: fair  once  seated near midline, supports with RUE Balance Overall balance assessment: Needs assistance Sitting-balance support: Feet supported, Single extremity supported Sitting balance-Leahy Scale: Fair Sitting balance - Comments: fair  once  seated near midline, supports with RUE Standing balance support: Bilateral upper extremity supported, During functional activity, Reliant on assistive device for balance Standing balance-Leahy Scale: Poor Standing balance comment: dynamic standing-zero     Special needs/care consideration Hgb A1c 7.8 16 fr urethral catheter placed  2/4    Previous Home Environment  Living Arrangements: Children (daughter)  Lives With: Daughter Available Help at Discharge: Family, Available 24 hours/day Type of Home: House Home Layout: One level Home Access: Ramped entrance Bathroom Shower/Tub: Multimedia programmer: Associate Professor Accessibility: Yes Home Care Services: Other (Comment) Additional Comments: OPPT 2x/week aquatics. Was able to ambulate ~>162ft with rest breaks using platform walker per pt.  At home for 1 to 1/2 years was min assist transfers to wheelchair and a few pivotal steps .  Just had walked with OP therapy with PFRW in past month, not at home. Once per week aquatics recently.   Discharge Living  Setting Plans for Discharge Living Setting: Patient's home, Lives with (comment) (daughter) Type of Home at Discharge: House Discharge Home Layout: One level Discharge Home Access: [place] entrance Discharge Bathroom Shower/Tub: Walk-in shower Discharge Bathroom Toilet: Standard Discharge Bathroom Accessibility: Yes How Accessible: Accessible via walker Does the patient have any problems obtaining your medications?: No   Social/Family/Support Systems Patient Roles: Parent Contact Information: daughter, [name] Anticipated Caregiver: daughter Anticipated Caregiver's Contact Information: see contacts Ability/Limitations of Caregiver: none Caregiver Availability: 24/7 Discharge Plan Discussed with Primary Caregiver: Yes Is Caregiver In Agreement with Plan?: Yes Does Caregiver/Family have Issues with Lodging/Transportation while Pt is in Rehab?: No   Goals Patient/Family Goal for Rehab: min asisst transfers and few pivotal steps with min assist PT, min OT, supervision SLP Expected length of stay: ELOS 14 to 20 days Pt/Family Agrees to Admission and willing to participate: Yes Program Orientation Provided & Reviewed with Pt/Caregiver Including Roles  & Responsibilities: Yes   Decrease burden of Care through IP rehab admission: n/a   Possible need for SNF placement upon discharge: not anticipated, Daughter has repeatedly refused in the past and opted to take home.   Patient Condition: I have reviewed medical records from [facility], spoken with  daughter. I discussed via phone for inpatient rehabilitation assessment.  Patient will benefit from ongoing PT, OT, and SLP, can actively participate in 3 hours of therapy a day 5 days of the week, and can make measurable gains during the admission.  Patient will also benefit from the coordinated team approach during an Inpatient Acute  Rehabilitation admission.  The patient will receive intensive therapy as well as Rehabilitation physician, nursing, social worker, and care management interventions.  Due to bladder management, bowel management, safety, skin/wound care, disease management, medication administration, pain management, and patient education the patient requires 24 hour a day rehabilitation nursing.  The patient is currently max assist overall with mobility and basic ADLs.  Discharge setting and therapy post discharge at home with home health is anticipated.  Patient has agreed to participate in the Acute Inpatient Rehabilitation Program and will admit today.   Preadmission Screen Completed By:  Cleatrice Burke, 08/13/2022 10:09 AM ______________________________________________________________________   Discussed status with Dr. Dagoberto Ligas on 08/13/22 at 1009 and received approval for admission today.   Admission Coordinator: Cleatrice Burke, RN, time 1009 Date 08/13/22    Assessment/Plan: Diagnosis: Does the need for close, 24 hr/day Medical supervision in concert with the patient's rehab needs make it unreasonable for this patient to be served in a less intensive setting? 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 Yes Does the patient require coordinated care of a physician, rehab nurse, PT, OT, and SLP to address physical and functional deficits in the context of the above medical diagnosis(es)? Yes Addressing deficits in the following areas: balance, endurance, locomotion, strength, transferring, bowel/bladder control, bathing, dressing, feeding, grooming, toileting, and swallowing Can  the patient actively participate in an intensive therapy program of at least 3 hrs of therapy 5 days a week? Yes The potential for patient to make measurable gains while on inpatient rehab is fair Anticipated functional outcomes upon discharge from inpatient rehab: min assist PT, min assist OT, supervision SLP Estimated rehab length of stay to reach the above functional goals is:  14-20 days Anticipated discharge destination: Home 10. Overall Rehab/Functional Prognosis: good and fair     MD Signature:           Revision History  Routing History

## 2022-08-13 NOTE — Progress Notes (Signed)
Patient manual BP is 230/90. RN observed patient did not get her 1400 dose of Hydralazine as charted "refused". Called over to [facility] to inquire, was informed that the nurse that gave me report had left. RN I spoke with states that the patient's daughter refuses medications and this is likely what occurred as a refusal. RN also informed this nurse patient's BP runs high. Informed PA Dan of BP increase. Unable to give the PO dose due to patient actively vomiting. New order to give 5 mg of Apresoline IV. Medication given, patient BP is 107/99.

## 2022-08-13 NOTE — Progress Notes (Signed)
Blood pressure currently 139/58, heart rate is 86. Hydralazine tablet 50 mg due. Patient's daughter at bedside and patient refuses blood pressure medication that is currently due. Patient and patient's daughter educated on risk/benefits of blood pressure medication. Patient's daughter and patient verbalizes understanding.

## 2022-08-13 NOTE — Progress Notes (Addendum)
Patient CBG reading is 168 prior to eating. Patient's daughter informed this nurse that the patient does not take insulin unless reading is greater than 200. Patient/daughter refused sliding scale coverage at this time. Patient is doing better, she inquired about what was on her meal tray. Sat patient up in bed and assisted her with her meal.

## 2022-08-13 NOTE — Discharge Instructions (Addendum)
Inpatient Rehab Discharge Instructions  [name] Discharge date and time: No discharge date for patient encounter.   Activities/Precautions/ Functional Status: Activity: activity as tolerated Diet: Dysphagia #1 nectar liquids Wound Care: Routine skin checks Functional status:  ___ No restrictions     ___ Walk up steps independently ___ 24/7 supervision/assistance   ___ Walk up steps with assistance ___ Intermittent supervision/assistance  ___ Bathe/dress independently ___ Walk with walker     __x_ Bathe/dress with assistance ___ Walk Independently    ___ Shower independently ___ Walk with assistance    ___ Shower with assistance ___ No alcohol     ___ Return to work/school ________  Special Instructions: No driving smoking or alcohol    COMMUNITY REFERRALS UPON DISCHARGE:    Home Health:   PT   OT   SP   RN                  Agency:[facility]    Phone: [phone]    Medical Equipment/Items Ordered:YONKERS SUCTION AND TUB BENCH                                                 Agency/Supplier:[facility]   [phone]     STROKE/TIA DISCHARGE INSTRUCTIONS SMOKING Cigarette smoking nearly doubles your risk of having a stroke & is the single most alterable risk factor  If you smoke or have smoked in the last 12 months, you are advised to quit smoking for your health. Most of the excess cardiovascular risk related to smoking disappears within a year of stopping. Ask you doctor about anti-smoking medications [place] Quit Line: 1-800-QUIT NOW Free Smoking Cessation Classes [phone]  CHOLESTEROL Know your levels; limit fat & cholesterol in your diet  Lipid Panel     Component Value Date/Time   CHOL 159 08/08/2022 0456   TRIG 127 08/08/2022 0456   HDL 34 (L) 08/08/2022 0456   CHOLHDL 4.7 08/08/2022 0456   VLDL 25 08/08/2022 0456   LDLCALC 100 (H) 08/08/2022 0456   LDLCALC 141 (H) 02/25/2022 [id number]     Many patients benefit from treatment even if their  cholesterol is at goal. Goal: Total Cholesterol (CHOL) less than 160 Goal:  Triglycerides (TRIG) less than 150 Goal:  HDL greater than 40 Goal:  LDL (LDLCALC) less than 100   BLOOD PRESSURE American Stroke Association blood pressure target is less that 120/80 mm/Hg  Your discharge blood pressure is:    Monitor your blood pressure Limit your salt and alcohol intake Many individuals will require more than one medication for high blood pressure  DIABETES (A1c is a blood sugar average for last 3 months) Goal HGBA1c is under 7% (HBGA1c is blood sugar average for last 3 months)  Diabetes:    Lab Results  Component Value Date   HGBA1C 7.8 (H) 08/08/2022    Your HGBA1c can be lowered with medications, healthy diet, and exercise. Check your blood sugar as directed by your physician Call your physician if you experience unexplained or low blood sugars.  PHYSICAL ACTIVITY/REHABILITATION Goal is 30 minutes at least 4 days per week  Activity: Increase activity slowly, Therapies: Physical Therapy: Home Health Return to work:  Activity decreases your risk of heart attack and stroke and makes your heart stronger.  It helps control your weight and blood pressure; helps you relax and  can improve your mood. Participate in a regular exercise program. Talk with your doctor about the best form of exercise for you (dancing, walking, swimming, cycling).  DIET/WEIGHT Goal is to maintain a healthy weight  Your discharge diet is:  Diet Order     None       liquids Your height is:    Your current weight is:   Your Body Mass Index (BMI) is:    Following the type of diet specifically designed for you will help prevent another stroke. Your goal weight range is:   Your goal Body Mass Index (BMI) is 19-24. Healthy food habits can help reduce 3 risk factors for stroke:  High cholesterol, hypertension, and excess weight.  RESOURCES Stroke/Support Group:  Call [phone]   STROKE EDUCATION PROVIDED/REVIEWED  AND GIVEN TO PATIENT Stroke warning signs and symptoms How to activate emergency medical system (call 911). Medications prescribed at discharge. Need for follow-up after discharge. Personal risk factors for stroke. Pneumonia vaccine given: No Flu vaccine given: No My questions have been answered, the writing is legible, and I understand these instructions.  I will adhere to these goals & educational materials that have been provided to me after my discharge from the hospital.      My questions have been answered and I understand these instructions. I will adhere to these goals and the provided educational materials after my discharge from the hospital.  Patient/Caregiver Signature _______________________________ Date __________  Clinician Signature _______________________________________ Date __________  Please bring this form and your medication list with you to all your follow-up doctor's appointments.

## 2022-08-13 NOTE — TOC Transition Note (Signed)
Transition of Care [facility]) - CM/SW Discharge Note  Patient Details  Name: [name] MRN: [id number] Date of Birth: 25-Aug-1945  Transition of Care [facility]) CM/SW Contact:  Sherie Don, LCSW Phone Number: 08/13/2022, 10:15 AM  Clinical Narrative: CIR has a bed available and will be admitted today. TOC signing off.    Final next level of care: IP Rehab Facility Barriers to Discharge: Barriers Resolved  Discharge Plan and Services Additional resources added to the After Visit Summary for        DME Arranged: N/A DME Agency: NA  Social Determinants of Health (SDOH) Interventions SDOH Screenings   Food Insecurity: No Food Insecurity (08/07/2022)  Housing: Low Risk  (08/07/2022)  Transportation Needs: No Transportation Needs (08/07/2022)  Utilities: Not At Risk (08/07/2022)  Depression (PHQ2-9): Low Risk  (08/28/2021)  Financial Resource Strain: Medium Risk (02/27/2018)  Physical Activity: Inactive (02/27/2018)  Social Connections: Somewhat Isolated (02/27/2018)  Stress: No Stress Concern Present (02/27/2018)  Tobacco Use: Low Risk  (08/07/2022)   Readmission Risk Interventions     No data to display

## 2022-08-13 NOTE — Progress Notes (Addendum)
Inpatient Rehabilitation Admissions Coordinator   I have CIR bed at [facility] to admit her to today . I contacted her daughter by phone at pt's bedside and she is in agreement. Acute team and TOC made aware. Dr Dagoberto Ligas will be admitting Rehab MD. I will contact [facility] for transport when CIR bed vacated and ready to admit. I will make the  arrangements to admit today.  Danne Baxter, RN, MSN Rehab Admissions Coordinator [phone] 08/13/2022 10:06 AM

## 2022-08-13 NOTE — Plan of Care (Signed)
  Problem: Education: Goal: Knowledge of disease or condition will improve Outcome: Progressing Goal: Knowledge of secondary prevention will improve (MUST DOCUMENT ALL) Outcome: Progressing Goal: Knowledge of patient specific risk factors will improve (Mark N/A or DELETE if not current risk factor) Outcome: Progressing   Problem: Ischemic Stroke/TIA Tissue Perfusion: Goal: Complications of ischemic stroke/TIA will be minimized Outcome: Progressing   Problem: Coping: Goal: Will verbalize positive feelings about self Outcome: Progressing   

## 2022-08-13 NOTE — Progress Notes (Signed)
Patient admitted to room [id number]. Per report patient needs manual BP for accuracy. Patient seen by MD Lovorn on arrival. Skin check completed with Marjorie Smolder RN. Blanchable redness noted to bilateral heels, ankles, sacrum. Skin tear noted to right hand. Foam covered on all areas mentioned.  Daughter present at bedside. Daughter states she wants her mom on as less medications as possible. Patient has nausea on arrival. Nurse offered nausea relief medications, patient declined stating she "hates taking medications" and wants as less medications as possible. VS: Manual BP 210/70, HR 75, RR 22, Room air 99%, temperature 98.3. Informed PA of BP, states to hold any PRN BP meds at this time. Patient denies pain or discomfort. Call bell in reach. Therapy informed daughter of therapy scheduling.

## 2022-08-14 ENCOUNTER — Inpatient Hospital Stay (HOSPITAL_COMMUNITY): Payer: Medicare PPO

## 2022-08-14 DIAGNOSIS — G464 Cerebellar stroke syndrome: Secondary | ICD-10-CM

## 2022-08-14 LAB — GLUCOSE, CAPILLARY
Glucose-Capillary: 181 mg/dL — ABNORMAL HIGH (ref 70–99)
Glucose-Capillary: 172 mg/dL — ABNORMAL HIGH (ref 70–99)
Glucose-Capillary: 157 mg/dL — ABNORMAL HIGH (ref 70–99)
Glucose-Capillary: 238 mg/dL — ABNORMAL HIGH (ref 70–99)

## 2022-08-14 LAB — IRON AND TIBC
Iron: 45 ug/dL (ref 28–170)
Saturation Ratios: 16 % (ref 10.4–31.8)
TIBC: 284 ug/dL (ref 250–450)
UIBC: 239 ug/dL

## 2022-08-14 LAB — CBC WITH DIFFERENTIAL/PLATELET
Abs Immature Granulocytes: 0.03 10*3/uL (ref 0.00–0.07)
Basophils Absolute: 0.1 10*3/uL (ref 0.0–0.1)
Basophils Relative: 1 %
Eosinophils Absolute: 0.4 10*3/uL (ref 0.0–0.5)
Eosinophils Relative: 6 %
HCT: 39.5 % (ref 36.0–46.0)
Hemoglobin: 13 g/dL (ref 12.0–15.0)
Immature Granulocytes: 0 %
Lymphocytes Relative: 23 %
Lymphs Abs: 1.6 10*3/uL (ref 0.7–4.0)
MCH: 27 pg (ref 26.0–34.0)
MCHC: 32.9 g/dL (ref 30.0–36.0)
MCV: 82 fL (ref 80.0–100.0)
Monocytes Absolute: 0.8 10*3/uL (ref 0.1–1.0)
Monocytes Relative: 11 %
Neutro Abs: 4.1 10*3/uL (ref 1.7–7.7)
Neutrophils Relative %: 59 %
Platelets: 266 10*3/uL (ref 150–400)
RBC: 4.82 MIL/uL (ref 3.87–5.11)
RDW: 15.4 % (ref 11.5–15.5)
WBC: 7 10*3/uL (ref 4.0–10.5)
nRBC: 0 % (ref 0.0–0.2)

## 2022-08-14 LAB — COMPREHENSIVE METABOLIC PANEL
ALT: 20 U/L (ref 0–44)
AST: 19 U/L (ref 15–41)
Albumin: 2.8 g/dL — ABNORMAL LOW (ref 3.5–5.0)
Alkaline Phosphatase: 98 U/L (ref 38–126)
Anion gap: 12 (ref 5–15)
BUN: 24 mg/dL — ABNORMAL HIGH (ref 8–23)
CO2: 17 mmol/L — ABNORMAL LOW (ref 22–32)
Calcium: 8.4 mg/dL — ABNORMAL LOW (ref 8.9–10.3)
Chloride: 110 mmol/L (ref 98–111)
Creatinine, Ser: 2.07 mg/dL — ABNORMAL HIGH (ref 0.44–1.00)
GFR, Estimated: 24 mL/min — ABNORMAL LOW (ref >60)
Glucose, Bld: 193 mg/dL — ABNORMAL HIGH (ref 70–99)
Potassium: 3.4 mmol/L — ABNORMAL LOW (ref 3.5–5.1)
Sodium: 139 mmol/L (ref 135–145)
Total Bilirubin: 0.6 mg/dL (ref 0.3–1.2)
Total Protein: 5.6 g/dL — ABNORMAL LOW (ref 6.5–8.1)

## 2022-08-14 LAB — VITAMIN B12: Vitamin B-12: 2646 pg/mL — ABNORMAL HIGH (ref 180–914)

## 2022-08-14 MED ORDER — HYDRALAZINE HCL 25 MG PO TABS
25.0000 mg | ORAL_TABLET | Freq: Three times a day (TID) | ORAL | Status: DC
  Filled 2022-08-14: qty 1

## 2022-08-14 MED ORDER — HYDRALAZINE HCL 25 MG PO TABS
25.0000 mg | ORAL_TABLET | Freq: Three times a day (TID) | ORAL | Status: DC | PRN
  Administered 2022-08-14 – 2022-08-15 (×2): 25 mg via ORAL
  Filled 2022-08-14 (×2): qty 1

## 2022-08-14 MED ORDER — VITAMIN D (ERGOCALCIFEROL) 1.25 MG (50000 UNIT) PO CAPS
50000.0000 [IU] | ORAL_CAPSULE | ORAL | Status: DC
  Administered 2022-08-14 – 2022-09-04 (×4): 50000 [IU] via ORAL
  Filled 2022-08-14 (×4): qty 1

## 2022-08-14 MED ORDER — SODIUM CHLORIDE 0.9 % IV SOLN: INTRAVENOUS | Status: AC

## 2022-08-14 MED ORDER — HYDRALAZINE HCL 25 MG PO TABS
25.0000 mg | ORAL_TABLET | Freq: Once | ORAL | Status: AC
  Administered 2022-08-14: 25 mg via ORAL

## 2022-08-14 NOTE — Progress Notes (Signed)
PROGRESS NOTE   Subjective/Complaints: BP has greatly improved Daughter concerned regarding patient's lethargy, rising creatinine. IVF ordered HS Vitamin D suboptimal - will start supplement  ROS: +lethargy as per daughter   Objective:   No results found. Recent Labs    08/13/22 1446 08/14/22 0730  WBC 8.6 7.0  HGB 14.2 13.0  HCT 43.2 39.5  PLT 278 266   Recent Labs    08/13/22 0430 08/13/22 1446 08/14/22 0730  NA 138  --  139  K 3.5  --  3.4*  CL 111  --  110  CO2 19*  --  17*  GLUCOSE 142*  --  193*  BUN 28*  --  24*  CREATININE 1.71* 1.75* 2.07*  CALCIUM 8.1*  --  8.4*    Intake/Output Summary (Last 24 hours) at 08/14/2022 1045 Last data filed at 08/14/2022 2952 Gross per 24 hour  Intake 240 ml  Output 950 ml  Net -710 ml        Physical Exam: Vital Signs Blood pressure (!) 143/59, pulse 80, temperature 98.3 F (36.8 C), temperature source Oral, resp. rate 16, height 5\' 2"  (1.575 m), weight 66.2 kg, SpO2 100 %. Constitutional:      Comments: Frail appearing; awake, somewhat alert, but very little speech; Sitting up in bed; daughter at bedside, NAD, BMI 26.69 HENT:     Head: Normocephalic and atraumatic.     Comments: L facial droop- from prior stroke Tongue midline    Right Ear: External ear normal.     Left Ear: External ear normal.     Nose: Nose normal. No congestion.     Mouth/Throat:     Mouth: Mucous membranes are dry.     Pharynx: Oropharynx is clear. No oropharyngeal exudate.  Eyes:     General:        Right eye: No discharge.        Left eye: No discharge.     Extraocular Movements: Extraocular movements intact.     Comments: Nystagmus to L>R  Cardiovascular:     Rate and Rhythm: Normal rate and regular rhythm.     Heart sounds: Normal heart sounds. No murmur heard.    No gallop.  Pulmonary:     Effort: No respiratory distress.     Breath sounds: Normal breath sounds. No  wheezing, rhonchi or rales.  Abdominal:     General: Bowel sounds are normal. There is no distension.     Palpations: Abdomen is soft.     Tenderness: There is no abdominal tenderness.  Genitourinary:    Comments: Foley in place; medium amber urine Musculoskeletal:     Cervical back: Neck supple. No tenderness.     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  Speech is mildly dysarthric but intelligible.  Follows simple commands.  Provides name and age. Finger to nose  on R side ok, but cannot test L Denies dizziness  Psychiatric:     Comments: Flat, decreased interaction    Assessment/Plan: 1. Functional deficits which require 3+ hours per day of interdisciplinary therapy in a comprehensive inpatient rehab setting. Physiatrist is providing close team supervision and 24 hour management of active medical problems listed below. Physiatrist and rehab team continue to assess barriers to discharge/monitor patient progress toward functional and medical goals  Care Tool:  Bathing              Bathing assist       Upper Body Dressing/Undressing Upper body dressing        Upper body assist      Lower Body Dressing/Undressing Lower body dressing            Lower body assist       Toileting Toileting    Toileting assist       Transfers Chair/bed transfer  Transfers assist     Chair/bed transfer assist level: Maximal Assistance - Patient 25 - 49%     Locomotion Ambulation   Ambulation assist   Ambulation activity did not occur: Safety/medical concerns          Walk 10 feet activity   Assist  Walk 10 feet activity did not occur: Safety/medical concerns         Walk 50 feet activity   Assist Walk 50 feet with 2 turns activity did not occur: Safety/medical concerns         Walk 150 feet activity   Assist Walk 150 feet activity did not occur: Safety/medical concerns         Walk 10 feet on uneven surface  activity   Assist Walk 10 feet on uneven surfaces activity did not occur: Safety/medical concerns         Wheelchair     Assist Is the patient using a wheelchair?: Yes Type of Wheelchair: Manual    Wheelchair assist level: Maximal Assistance - Patient 25 - 49% Max wheelchair distance: 75ft    Wheelchair 50 feet with 2 turns activity    Assist        Assist Level: Total Assistance - Patient < 25%   Wheelchair 150 feet activity     Assist      Assist Level: Total Assistance - Patient < 25%   Blood pressure (!) 143/59, pulse 80, temperature 98.3 F (36.8 C), temperature source Oral, resp. rate 16, height 5\' 2"  (1.575 m), weight 66.2 kg, SpO2 100 %.  Medical Problem List and Plan: 1. 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  Antithrombotics: -DVT/anticoagulation:  Pharmaceutical: Lovenox             -antiplatelet therapy: Aspirin 81 mg daily and Plavix 75 mg day x 3 weeks then aspirin alone 3. Pain Management: Oxycodone as needed 4. Mood/Behavior/Sleep: Zoloft 25 mg daily, trazodone as needed             -antipsychotic agents: N/A 5. Neuropsych/cognition: This patient is? capable of making decisions on her own behalf. 6. Skin/Wound Care: Routine skin checks 7. Fluids/Electrolytes/Nutrition: Routine in and outs with follow-up chemistries 8.  Dysphagia.  Dysphagia #3 nectar thick liquids.  Follow-up speech therapy 9.  Hypertension.  Hydralazine 50 mg every 8 hours.  Losartan  recently held due to AKI. Daughter notes BP been the best controlled in a long time. Decrease hydralazine to 25mg  TID 10.  Acute urinary retention.  d/c flomax 11.  AKI on CKD stage III.  Baseline creatinine 1.9-2.0.  Latest creatinine 1.71.  Losartan held.  Follow-up chemistries. Start IVF HS.  12.  Diabetes mellitus.  Hemoglobin A1c 7.8.  SSI.  Previously patient had declined initiation of any diabetic medications per documentation.  Will follow-up with family. 13.  History of right ankle fracture.  Patient does wear an ankle stabilizing brace due to previous ankle fx. . 14.  Hyperlipidemia.  Zetia/fenofibrate. 15.  Constipation.  MiraLAX twice daily, Senokot 2 tabs at bedtime as needed.  Hold for loose stools. 16. 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 17. Suboptimal vitamin D: start ergocalciferol 50,000U once per week for 7 weeks      LOS: 1 days A FACE TO FACE EVALUATION WAS PERFORMED  Jasmyne Lodato P Jamison Soward 08/14/2022, 10:45 AM

## 2022-08-14 NOTE — Evaluation (Signed)
Occupational Therapy Assessment and Plan  Patient Details  Name: [name] MRN: [id number] Date of Birth: 12-Apr-1946  OT Diagnosis: apraxia, cognitive deficits, hemiplegia affecting dominant side, hemiplegia affecting non-dominant side, and muscle weakness (generalized) Rehab Potential: Rehab Potential (ACUTE ONLY): Fair ELOS: ~ 2.5 weeks   Today's Date: 08/14/2022 OT Individual Time: 1400-1500 OT Individual Time Calculation (min): 60 min     Hospital Problem: Principal Problem:   Lateral medullary syndrome Active Problems:   Left hemiparesis (HCC)   Spasticity as late effect of cerebrovascular accident (CVA)   Past Medical History:  Past Medical History:  Diagnosis Date   Allergy    Arthritis    Broken ankle    Cataract    Diabetes mellitus without complication ([place])    History of colonoscopy    Hypertension    Macular degeneration syndrome    with edema---being treated.    Stage 4 chronic kidney disease ([place])    Stroke ([place]) 2019   Past Surgical History:  Past Surgical History:  Procedure Laterality Date   callous removal  Left 2017   located on 1st digit on L foot 2/2 diabetes   EYE SURGERY      Assessment & Plan Clinical Impression: Patient is a 77 y.o. year old ight-handed female with history significant for CVA with left-sided residual weakness maintained on low-dose aspirin and received inpatient rehab services 02/27/2018 - 03/14/2018, hypertension, hyperlipidemia, type 2 diabetes mellitus, CKD stage III, ankle fracture 1 year ago and wears a right ankle brace, recent UTI.  Per chart review patient lives with family and assistance as needed.  Patient ambulates with a platform rolling walker.  She was attending aquatics 2 times a week.  Daughter does assist with some ADLs.  Presented 08/07/2022 with generalized weakness decrease in appetite.  Patient had initially been seen in the ER 1 week prior for generalized weakness CT at that time showed no acute findings UA  showed WBC greater than 50,000 she was placed on antibiotic therapy and discharged to home.  Daughter noted worsening of left facial droop with slurred speech not able to keep food down and intermittent nausea vomiting.  She presented back to the ED with CT/MRI showing a 4 mm acute left medullary infarction.  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  KUB showed no evidence of bowel obstruction or other acute findings.  Incidental findings of nonobstructive renal stone.  Admission chemistries unremarkable except potassium 3.4 glucose 190 BUN 38 creatinine 1.63, WBC 13,700.  Urinalysis negative nitrite, hemoglobin A1c 7.8.  Echo with ejection fraction of 60 to 65% no wall motion abnormalities grade 2 diastolic dysfunction no regional wall motion abnormalities.  Neurology follow-up maintained on low-dose aspirin with the addition of Plavix for CVA prophylaxis.  Lovenox for DVT prophylaxis x 3 weeks then aspirin alone.  She is currently on a mechanical soft nectar thick liquid diet.  Hospital course bouts of urinary retention Foley tube maintained through 2/3 and removed but failed voiding trial and replaced 2/4.  AKI on CKD stage IV creatinine increasing to 2.0 suspect due to urinary retention placed on gentle hydration and losartan was held.  .  Patient transferred to CIR on 08/13/2022 .    Patient currently requires  max to total  A  with basic self-care skills and and max A or use of STEDY for safe transfers  secondary to muscle weakness, muscle joint tightness, and comorbidity from prior CVA, decreased cardiorespiratoy endurance,  impaired timing and sequencing, abnormal tone, unbalanced muscle activation, motor apraxia, decreased coordination, and decreased motor planning, decreased attention, decreased awareness, decreased problem solving, decreased safety awareness, and delayed processing, and decreased sitting balance, decreased standing balance,  decreased postural control, hemiplegia, decreased balance strategies, and difficulty maintaining precautions.  Prior to hospitalization, patient could complete ADLs with min.  Patient will benefit from skilled intervention to decrease level of assist with basic self-care skills and increase independence with basic self-care skills prior to discharge home with care partner.  Anticipate patient will require moderate physical assestance and follow up home health and follow up outpatient.  OT - End of Session Activity Tolerance: Tolerates 10 - 20 min activity with multiple rests Endurance Deficit: Yes OT Assessment Rehab Potential (ACUTE ONLY): Fair OT Patient demonstrates impairments in the following area(s): Balance;Sensory;Behavior;Skin Integrity;Cognition;Edema;Endurance;Motor;Pain;Perception;Safety OT Basic ADL's Functional Problem(s): Grooming;Eating;Bathing;Dressing;Toileting OT Transfers Functional Problem(s): Toilet;Tub/Shower OT Additional Impairment(s): Fuctional Use of Upper Extremity OT Plan OT Intensity: Minimum of 1-2 x/day, 45 to 90 minutes OT Frequency: 5 out of 7 days OT Duration/Estimated Length of Stay: ~ 2.5 weeks OT Treatment/Interventions: Balance/vestibular training;Disease mangement/prevention;Neuromuscular re-education;Self Care/advanced ADL retraining;Therapeutic Exercise;Cognitive remediation/compensation;DME/adaptive equipment instruction;Wheelchair propulsion/positioning;Pain management;Skin care/wound managment;UE/LE Strength taining/ROM;Community reintegration;Patient/family education;Splinting/orthotics;UE/LE Coordination activities;Discharge planning;Functional mobility training;Psychosocial support;Therapeutic Activities;Visual/perceptual remediation/compensation OT Self Feeding Anticipated Outcome(s): supervision OT Basic Self-Care Anticipated Outcome(s): mod  A OT Toileting Anticipated Outcome(s): mod I OT Bathroom Transfers Anticipated Outcome(s): mod A OT  Recommendation Patient destination: Home Follow Up Recommendations: 24 hour supervision/assistance;Outpatient OT Equipment Recommended: To be determined   OT Evaluation Precautions/Restrictions  Precautions Precautions: Fall Precaution Comments: premorbid L hemi Required Braces or Orthoses: Other Brace Other Brace: R ankle stabilizing brace from prior ankle fx Restrictions Weight Bearing Restrictions: No General Chart Reviewed: Yes Family/Caregiver Present: Yes (DAughter) Vital Signs Therapy Vitals Temp: (!) 97.2 F (36.2 C) Pulse Rate: 70 Resp: 16 BP: (!) 165/69 Patient Position (if appropriate): Sitting Oxygen Therapy SpO2: 100 % O2 Device: Room Air Pain Pain Assessment Pain Scale: 0-10 Pain Score: 0-No pain Home Living/Prior Functioning Home Living Living Arrangements: Children Available Help at Discharge: Family, Available 24 hours/day Type of Home: House Home Access: Ramped entrance Home Layout: One level Bathroom Shower/Tub: Multimedia programmer: Handicapped height Bathroom Accessibility: Yes Additional Comments: Primarily used the wheelchair for mobility. Only would walk with therapy using platform walker  Lives With: Daughter Prior Function Level of Independence: Needs assistance with ADLs, Needs assistance with homemaking, Needs assistance with gait, Needs assistance with tranfers Bath: Maximal Toileting: Maximal Dressing: Maximal  Able to Take Stairs?: No Driving: No Vocation: Retired Surveyor, mining Baseline Vision/History: 4 Cataracts;6 Macular Degeneration Ability to See in Adequate Light: 0 Adequate Patient Visual Report: No change from baseline Vision Assessment?: Vision impaired- to be further tested in functional context Perception  Inattention/Neglect: Does not attend to left side of body Praxis Praxis: Impaired Praxis Impairment Details: Initiation;Motor planning Cognition Cognition Overall Cognitive Status: Impaired/Different from  baseline Arousal/Alertness: Awake/alert Orientation Level: Person;Place;Situation Person: Oriented Place: Oriented Situation: Oriented Memory: Impaired Memory Impairment: Retrieval deficit;Decreased recall of new information Attention: Focused;Sustained;Selective Focused Attention: Appears intact Sustained Attention: Appears intact Selective Attention: Impaired Selective Attention Impairment: Functional basic Awareness: Impaired Awareness Impairment: Intellectual impairment Safety/Judgment: Impaired Brief Interview for Mental Status (BIMS) Repetition of Three Words (First Attempt): 3 Temporal Orientation: Year: Correct Temporal Orientation: Month: Accurate within 5 days Temporal Orientation: Day: Incorrect Recall: "Sock": Yes, no cue required Recall: "Blue": Yes, no cue required Recall: "Bed": No, could not recall BIMS Summary  Score: 12 Sensation Sensation Proprioception: Impaired by gross assessment;Impaired Detail Proprioception Impaired Details: Impaired LLE;Impaired RLE Coordination Gross Motor Movements are Fluid and Coordinated: No Fine Motor Movements are Fluid and Coordinated: No Motor  Motor Motor: Hemiplegia;Abnormal tone;Motor apraxia;Abnormal postural alignment and control  Trunk/Postural Assessment  Cervical Assessment Cervical Assessment:  (forward flexed) Thoracic Assessment Thoracic Assessment:  (forward flexed; rounded shoulders; left shoulder rounded to the midline from prior CVA) Lumbar Assessment Lumbar Assessment:  (posterior pelvic tilt) Postural Control Postural Control: Deficits on evaluation Trunk Control: posterior bias, retropulsion; falling to the right with max cues and environmental cues to correct Righting Reactions: delayed & insufficient  Balance Balance Balance Assessed: Yes Static Sitting Balance Static Sitting - Level of Assistance: 4: Min assist Dynamic Sitting Balance Dynamic Sitting - Balance Support: Feet supported;No upper  extremity supported Dynamic Sitting - Level of Assistance: 3: Mod assist Static Standing Balance Static Standing - Balance Support: Bilateral upper extremity supported Static Standing - Level of Assistance: 1: +2 Total assist Extremity/Trunk Assessment RUE Assessment RUE Assessment: Within Functional Limits Active Range of Motion (AROM) Comments: WFL General Strength Comments: uncoordinated - when trying to bring right hand to mouth - missing mouth  3/5 LUE Assessment LUE Assessment: Exceptions to [facility] General Strength Comments: Able to extend and flex hand and bicep but with attention to it; at rest stays in syngestic pattern ( internally rotated and flexed at abdomen)- able to devate from synergy LUE Body System: Neuro Brunstrum levels for arm and hand: Arm;Hand Brunstrum level for arm: Stage III Synergy is performed voluntarily Brunstrum level for hand: Stage IV Movements deviating from synergies  Care Tool Care Tool Self Care Eating   Eating Assist Level: Minimal Assistance - Patient > 75%    Oral Care    Oral Care Assist Level: Minimal Assistance - Patient > 75%    Bathing   Body parts bathed by patient: Left arm;Chest;Abdomen;Right upper leg;Left upper leg;Face Body parts bathed by helper: Right arm;Front perineal area;Buttocks;Right lower leg;Left lower leg   Assist Level: Maximal Assistance - Patient 24 - 49%    Upper Body Dressing(including orthotics)   What is the patient wearing?: Pull over shirt   Assist Level: Moderate Assistance - Patient 50 - 74%    Lower Body Dressing (excluding footwear)   What is the patient wearing?: Underwear/pull up;Pants Assist for lower body dressing: Total Assistance - Patient < 25%    Putting on/Taking off footwear   What is the patient wearing?: Orthosis;Shoes;Socks Assist for footwear: Dependent - Patient 0%       Care Tool Toileting Toileting activity   Assist for toileting: 2 Helpers     Care Tool Bed Mobility Roll left  and right activity        Sit to lying activity        Lying to sitting on side of bed activity   Lying to sitting on side of bed assist level: the ability to move from lying on the back to sitting on the side of the bed with no back support.: Maximal Assistance - Patient 25 - 49%     Care Tool Transfers Sit to stand transfer   Sit to stand assist level: Total Assistance - Patient < 25%    Chair/bed transfer   Chair/bed transfer assist level: Total Assistance - Patient < 25%     Toilet transfer   Assist Level: 2 Helpers     Care Tool Cognition  Expression of Ideas and Wants Expression of  Ideas and Wants: 3. Some difficulty - exhibits some difficulty with expressing needs and ideas (e.g, some words or finishing thoughts) or speech is not clear  Understanding Verbal and Non-Verbal Content Understanding Verbal and Non-Verbal Content: 3. Usually understands - understands most conversations, but misses some part/intent of message. Requires cues at times to understand   Memory/Recall Ability Memory/Recall Ability : That he or she is in a hospital/hospital unit   Refer to Care Plan for Long Term Goals  SHORT TERM GOAL WEEK 1 OT Short Term Goal 1 (Week 1): Pt will transfer to [facility] with max A +1 consistently OT Short Term Goal 2 (Week 1): Pt will don shirt with min A OT Short Term Goal 3 (Week 1): Pt will sit unsupported during functional task with supervision OT Short Term Goal 4 (Week 1): Pt will perform sit to stand with max A consistently for clothing management with LRAD  Recommendations for other services: Neuropsych   Skilled Therapeutic Intervention Ot eval initiated and OT goals, purpose and role reviewed with pt and pt;s daughter. Pt in bed when arrived and leaning to the right and coughing on salvia - assisted with repositioning. Pt assisted to EOB with HOB elevated and bed rail with mod - max A with extra time and support for trunk support. At EOB pt required min A for trunk  support at midline - leaning to the right; able to come to midline but unable to sustain position. Discussed trying the STEDY to see if it was an option for nursing to assist patient. This OT had come in early in the afternoon to assist with the SLP to get into the w/c for lunch and her assessment. Pt required max to total A for squat pivot transfer. With the STEDY pt able to come into stand with maX  A with trunk support to maintain midline. Performed bathing and changing out of clothing into gown for the late afternoon. See below. Pt recommended at this time to stand in brace and shoes- will continue to evaluate the brace with PT to see best options. IWith talking with pt's daughter and PT (who saw her eariler the in the day; she presented different this pm. She was having difficuly sustaining static sitting sitting falling to the right even in the w/c; coughing on salvia in the bed. The Daughter also reported pt was better at [facility] and was able to stand and transfer with less assistance. Discussed this with team.  Pt's w/c cushion wedged on the right to help bring her pelvis to neutral to help prevent from falling to the right and half laptray for left Ue support with goal to help position it away from the trunk - shoulder abduction.  Left sitting up in the w/c wit nursing and daughter.   ADL ADL Upper Body Bathing: Maximal assistance Where Assessed-Upper Body Bathing: Sitting at sink Lower Body Bathing: Maximal assistance Where Assessed-Lower Body Bathing: Sitting at sink Upper Body Dressing: Moderate assistance Where Assessed-Upper Body Dressing: Sitting at sink Lower Body Dressing: Dependent Where Assessed-Lower Body Dressing: Sitting at sink Mobility  Bed Mobility Supine to Sit: Maximal Assistance - Patient - Patient 25-49% Sit to Supine: Maximal Assistance - Patient 25-49% Transfers Sit to Stand: Maximal Assistance - Patient 25-49% Stand to Sit: Maximal Assistance - Patient  25-49%   Discharge Criteria: Patient will be discharged from OT if patient refuses treatment 3 consecutive times without medical reason, if treatment goals not met, if there is a change in medical  status, if patient makes no progress towards goals or if patient is discharged from hospital.  The above assessment, treatment plan, treatment alternatives and goals were discussed and mutually agreed upon: by patient  Nicoletta Ba 08/14/2022, 3:54 PM

## 2022-08-14 NOTE — Progress Notes (Addendum)
Writer was informed by Clinical Coordinator regarding order for CT scan  from MD re: clinical changes noted by OT earlier.  Patient was assessed ,alert,responsive , No noted neurological  changes; V/S stable  and  WNL; Patient was Informed of  CT  scan of Head per MD order. Patient  understood and comprehended. Continued to monitor.

## 2022-08-14 NOTE — Progress Notes (Signed)
Patient's family refused insulin as it 157, being less than 200. PA on-call and MD notified.

## 2022-08-14 NOTE — Plan of Care (Signed)
  Problem: RH Balance Goal: LTG Patient will maintain dynamic sitting balance (PT) Description: LTG:  Patient will maintain dynamic sitting balance with assistance during mobility activities (PT) Flowsheets (Taken 08/14/2022 1516) LTG: Pt will maintain dynamic sitting balance during mobility activities with:: Contact Guard/Touching assist   Problem: Sit to Stand Goal: LTG:  Patient will perform sit to stand with assistance level (PT) Description: LTG:  Patient will perform sit to stand with assistance level (PT) Flowsheets (Taken 08/14/2022 1516) LTG: PT will perform sit to stand in preparation for functional mobility with assistance level: Moderate Assistance - Patient 50 - 74%   Problem: RH Bed Mobility Goal: LTG Patient will perform bed mobility with assist (PT) Description: LTG: Patient will perform bed mobility with assistance, with/without cues (PT). Flowsheets (Taken 08/14/2022 1516) LTG: Pt will perform bed mobility with assistance level of: Moderate Assistance - Patient 50 - 74%   Problem: RH Bed to Chair Transfers Goal: LTG Patient will perform bed/chair transfers w/assist (PT) Description: LTG: Patient will perform bed to chair transfers with assistance (PT). Flowsheets (Taken 08/14/2022 1516) LTG: Pt will perform Bed to Chair Transfers with assistance level: Moderate Assistance - Patient 50 - 74%   Problem: RH Car Transfers Goal: LTG Patient will perform car transfers with assist (PT) Description: LTG: Patient will perform car transfers with assistance (PT). Flowsheets (Taken 08/14/2022 1516) LTG: Pt will perform car transfers with assist:: Moderate Assistance - Patient 50 - 74%   Problem: RH Ambulation Goal: LTG Patient will ambulate in controlled environment (PT) Description: LTG: Patient will ambulate in a controlled environment, # of feet with assistance (PT). Flowsheets (Taken 08/14/2022 1516) LTG: Pt will ambulate in controlled environ  assist needed:: Moderate Assistance -  Patient 50 - 74% LTG: Ambulation distance in controlled environment: 23ft   Problem: RH Wheelchair Mobility Goal: LTG Patient will propel w/c in controlled environment (PT) Description: LTG: Patient will propel wheelchair in controlled environment, # of feet with assist (PT) Flowsheets (Taken 08/14/2022 1516) LTG: Pt will propel w/c in controlled environ  assist needed:: Moderate Assistance - Patient 50 - 74% LTG: Propel w/c distance in controlled environment: 34ft Goal: LTG Patient will propel w/c in home environment (PT) Description: LTG: Patient will propel wheelchair in home environment, # of feet with assistance (PT). Flowsheets (Taken 08/14/2022 1516) LTG: Pt will propel w/c in home environ  assist needed:: Moderate Assistance - Patient 50 - 74% LTG: Propel w/c distance in home environment: 43ft

## 2022-08-14 NOTE — Progress Notes (Addendum)
Patient and patient's daughter have concerns about the schedule hydralazine 50 mg PO that is due now. Patient and patient's daughter states " Im okay with taking 25 mg of hydralazine but not the 50 mg of hydralazine. Patient and patient's daughter refused the 50 mg of hydralazine PO. Blood pressure is 157/55, HR 80. PA-C on unit made aware, order received for a one time dose of hydralazine 25 mg PO.

## 2022-08-14 NOTE — Evaluation (Addendum)
Speech Language Pathology Assessment and Plan  Patient Details  Name: [name] MRN: [id number] Date of Birth: Jul 01, 1946  SLP Diagnosis: Dysarthria;Dysphagia  Rehab Potential: Fair ELOS: 12-14 days    Today's Date: 08/14/2022 SLP Individual Time: 1200-1310 SLP Individual Time Calculation (min): 45 min   Hospital Problem: Principal Problem:   Lateral medullary syndrome Active Problems:   Left hemiparesis (HCC)   Spasticity as late effect of cerebrovascular accident (CVA)  Past Medical History:  Past Medical History:  Diagnosis Date   Allergy    Arthritis    Broken ankle    Cataract    Diabetes mellitus without complication ([place])    History of colonoscopy    Hypertension    Macular degeneration syndrome    with edema---being treated.    Stage 4 chronic kidney disease ([place])    Stroke ([place]) 2019   Past Surgical History:  Past Surgical History:  Procedure Laterality Date   callous removal  Left 2017   located on 1st digit on L foot 2/2 diabetes   EYE SURGERY      Assessment / Plan / Recommendation Clinical Impression  Emesis  : HPI: [name] is a 77 year old right-handed female with history significant for CVA with left-sided residual weakness maintained on low-dose aspirin and received inpatient rehab services 02/27/2018 - 03/14/2018, hypertension, hyperlipidemia, type 2 diabetes mellitus, CKD stage III, ankle fracture 1 year ago and wears a right ankle brace, recent UTI.  Per chart review patient lives with family and assistance as needed.  Patient ambulates with a platform rolling walker.  She was attending aquatics 2 times a week.  Daughter does assist with some ADLs.  Presented 08/07/2022 with generalized weakness decrease in appetite.  Patient had initially been seen in the ER 1 week prior for generalized weakness CT at that time showed no acute findings UA showed WBC greater than 50,000 she was placed on antibiotic therapy and discharged to home.  Daughter  noted worsening of left facial droop with slurred speech not able to keep food down and intermittent nausea vomiting.  She presented back to the ED with CT/MRI showing a 4 mm acute left medullary infarction.  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  KUB showed no evidence of bowel obstruction or other acute findings.  Incidental findings of nonobstructive renal stone.  Admission chemistries unremarkable except potassium 3.4 glucose 190 BUN 38 creatinine 1.63, WBC 13,700.  Urinalysis negative nitrite, hemoglobin A1c 7.8.  Echo with ejection fraction of 60 to 65% no wall motion abnormalities grade 2 diastolic dysfunction no regional wall motion abnormalities.  Neurology follow-up maintained on low-dose aspirin with the addition of Plavix for CVA prophylaxis.  Lovenox for DVT prophylaxis x 3 weeks then aspirin alone.  She is currently on a mechanical soft nectar thick liquid diet.  Hospital course bouts of urinary retention Foley tube maintained through 2/3 and removed but failed voiding trial and replaced 2/4.  AKI on CKD stage IV creatinine increasing to 2.0 suspect due to urinary retention placed on gentle hydration and losartan was held.  Therapy evaluations completed due to patient decreased functional mobility and dysphagia was admitted for a comprehensive rehab program.   Pt reports used to get Botox by Dr Letta Pate in clinic Wears R foot/ankle lace up bootie due to ankle fracture.  LBM yesterday- not constipated Not feeling like needs to pee, even when foley wasn't in place-   SLP consulted to complete clinical swallow  evaluation and informal motor speech + speech and language evaluation in the setting of acute medullary CVA. Pt greeted awake and lying semi-reclined in bed. Transferred to w/c with Total A from OT. Agreeable to evaluation in hospital room. Daughter present for the duration of today's evaluation and provided information as  requested.  Per clinical observation, pt presents with moderate oral with known pharyngeal dysphagia in the setting of sensorimotor deficits. Pt continues to present with prolonged and incomplete A-P transit with all boluses, oral holding, and decreased sensation/awareness of oral residuals requiring Mod-Max A to adhere to aspiration precautions. Increased wet vocal quality noted when nectar-thick liquid was administered via straw. Cued throat clear appeared to return vocal quality to baseline. No additional instances of wet vocal quality noted when nectar-thick liquid was given via cup. Pt benefited from oral suctioning intermittently during meal given inability to clear oral residuals (R > L) and verbal cues "1, 2, 3, swallow" to initiate swallow response. Re: motor speech, pt was ~85% intelligible from word to sentence level; appeared stimulable for use of speech intelligibility strategies to include slow rate, pausing/phrasing, and overstimulation, which improved her intelligibility to ~90%. Informally, speech and language appeared intact for today's tasks, though will need additional assessment. Due to time constraints, unable to assess cognitive functioning; therefore, further assessment to follow.  Currently, pt is nonambulatory and able to self-feed and perform oral hygiene with Total A level of assistance. Prior to admission, pt was receiving total care from her daughters for all aspects of daily living to include ADL's and iADL's though did feed herself.   Given presentation and pt/family report, recommend downgrade to Dysphagia 2 textures and nectar-thick liquids via cup (no straws) given Mod-Max A cues for adherence to aspiration precautions; medications whole or crushed with puree + 1:1 supervision/assistance. Instrumental swallow assessment does not appear indicated at this time; however, will continue to monitor to assess need for repeat. Results and recommendations were reviewed with pt and  pt's daughter who verbalized understanding and agreement with daughter demonstrating understanding via teach back (has been cleared to provide full supervision).    Skilled Therapeutic Interventions          CSE and informal motor speech and speech and language evaluation completed. Please see report for full details.   SLP Assessment  Patient will need skilled Speech Lanaguage Pathology Services during CIR admission    Recommendations  Medication Administration: Whole meds with puree Supervision: Patient able to self feed;Full supervision/cueing for compensatory strategies Compensations: Slow rate;Small sips/bites;Follow solids with liquid;Lingual sweep for clearance of pocketing;Minimize environmental distractions;Other (Comment) (1, 2, 3 swallow - cues to swallow) Postural Changes and/or Swallow Maneuvers: Seated upright 90 degrees;Upright 30-60 min after meal Oral Care Recommendations: Oral care BID;Oral care before and after PO Patient destination: Home Follow up Recommendations: Home Health SLP;24 hour supervision/assistance Equipment Recommended: Other (comment) (suction kit)    SLP Frequency 3 to 5 out of 7 days   SLP Duration  SLP Intensity  SLP Treatment/Interventions 12-14 days  Minumum of 1-2 x/day, 30 to 90 minutes  Cognitive remediation/compensation;Dysphagia/aspiration precaution training;Other (comment);Patient/family education (speech intelligibility strategies)    Pain Pain Assessment Pain Scale: 0-10 Pain Score: 0-No pain  Prior Functioning Cognitive/Linguistic Baseline: Baseline deficits (Total care per daughter) Baseline deficit details: Pt's daughter completed all iADL tasks Type of Home: House  Lives With: Daughter Available Help at Discharge: Family;Available 24 hours/day Vocation: Retired  Programmer, systems Overall Cognitive Status: Difficult to assess Arousal/Alertness: Awake/alert Attention: Focused;Sustained;Selective  Focused Attention:  Appears intact Sustained Attention: Appears intact Selective Attention: Impaired Selective Attention Impairment: Verbal basic Memory: Impaired Memory Impairment: Retrieval deficit;Decreased recall of new information  Comprehension Auditory Comprehension Overall Auditory Comprehension: Appears within functional limits for tasks assessed Visual Recognition/Discrimination Discrimination: Not tested Reading Comprehension Reading Status: Not tested Expression Expression Primary Mode of Expression: Verbal Verbal Expression Overall Verbal Expression: Appears within functional limits for tasks assessed Written Expression Dominant Hand: Right Written Expression: Not tested Oral Motor Oral Motor/Sensory Function Overall Oral Motor/Sensory Function: Moderate impairment Facial ROM: Reduced left Facial Symmetry: Abnormal symmetry left Facial Strength: Reduced left Facial Sensation: Reduced left;Reduced right Lingual ROM: Reduced left Lingual Symmetry: Abnormal symmetry left Lingual Strength: Suspected CN XII (hypoglossal) dysfunction Lingual Sensation: Reduced Mandible: Within Functional Limits Motor Speech Overall Motor Speech: Impaired Phonation: Other (comment) (Strained) Resonance: Within functional limits Articulation: Impaired Level of Impairment: Phrase Intelligibility: Intelligibility reduced Word: 75-100% accurate Phrase: 75-100% accurate Sentence: 75-100% accurate Effective Techniques: Slow rate;Increased vocal intensity;Over-articulate;Pacing  Care Tool Care Tool Cognition Ability to hear (with hearing aid or hearing appliances if normally used Ability to hear (with hearing aid or hearing appliances if normally used): 0. Adequate - no difficulty in normal conservation, social interaction, listening to TV   Expression of Ideas and Wants Expression of Ideas and Wants: 3. Some difficulty - exhibits some difficulty with expressing needs and ideas (e.g, some words or  finishing thoughts) or speech is not clear   Understanding Verbal and Non-Verbal Content Understanding Verbal and Non-Verbal Content: 3. Usually understands - understands most conversations, but misses some part/intent of message. Requires cues at times to understand  Memory/Recall Ability Memory/Recall Ability : That he or she is in a hospital/hospital unit   PMSV Assessment  PMSV Trial Intelligibility: Intelligibility reduced Word: 75-100% accurate Phrase: 75-100% accurate Sentence: 75-100% accurate  Bedside Swallowing Assessment General Date of Onset: 08/08/22 Previous Swallow Assessment: MBSS on 08/08/2022 with recommendations for D1 and nectar-thick liquids Diet Prior to this Study: Dysphagia 3 (mechanical soft);Mildly thick liquids (Level 2, nectar thick) Temperature Spikes Noted: No Respiratory Status: Room air History of Recent Intubation: No Behavior/Cognition: Alert;Cooperative;Pleasant mood Oral Cavity - Dentition: Adequate natural dentition Self-Feeding Abilities: Able to feed self;Needs set up;Needs assist Vision: Functional for self-feeding Patient Positioning: Upright in chair/Tumbleform Baseline Vocal Quality: Other (comment) (Strained) Volitional Cough: Weak Volitional Swallow: Able to elicit  Ice Chips Ice chips: Not tested Thin Liquid Thin Liquid: Not tested Nectar Thick Nectar Thick Liquid: Impaired Presentation: Cup;Straw;Self Fed Oral phase functional implications: Prolonged oral transit Pharyngeal Phase Impairments: Suspected delayed Swallow;Wet Vocal Quality Other Comments: wet vocal quality with straw Honey Thick Honey Thick Liquid: Not tested Puree Puree: Impaired Presentation: Spoon;Self Fed Oral Phase Functional Implications: Oral residue;Prolonged oral transit Pharyngeal Phase Impairments: Suspected delayed Swallow Solid Solid: Impaired Oral Phase Impairments: Impaired mastication;Poor awareness of bolus;Reduced lingual  movement/coordination Oral Phase Functional Implications: Right lateral sulci pocketing;Prolonged oral transit;Oral residue;Impaired mastication Pharyngeal Phase Impairments: Suspected delayed Swallow BSE Assessment Suspected Esophageal Findings Suspected Esophageal Findings:  (None) Risk for Aspiration Impact on safety and function: Moderate aspiration risk;Risk for inadequate nutrition/hydration Other Related Risk Factors: Deconditioning;Previous CVA  Short Term Goals: Week 1: SLP Short Term Goal 1 (Week 1): Pt will participate in therapeutic trials of thin liquid via tsp with no s/sx concerning for airway intrusion to assess readiness for repeat swallow study. SLP Short Term Goal 2 (Week 1): Pt will demonstrate tolerance of current diet textures with minimal s/sx concerning for airway intrusion with  Mod A. SLP Short Term Goal 3 (Week 1): Pt will utilize speech intelligibility strategies to improve intelligibilty at the phrase and sentence level to 90%. SLP Short Term Goal 4 (Week 1): Pt will complete formal cognitive-linguistic evaluation to further determine ST POC with 100% completion.  Refer to Care Plan for Long Term Goals  Recommendations for other services: None   Discharge Criteria: Patient will be discharged from SLP if patient refuses treatment 3 consecutive times without medical reason, if treatment goals not met, if there is a change in medical status, if patient makes no progress towards goals or if patient is discharged from hospital.  The above assessment, treatment plan, treatment alternatives and goals were discussed and mutually agreed upon: by patient and by family  Vinnie Langton 08/14/2022, 3:43 PM

## 2022-08-14 NOTE — Plan of Care (Signed)
  Problem: RH Balance Goal: LTG: Patient will maintain dynamic sitting balance (OT) Description: LTG:  Patient will maintain dynamic sitting balance with assistance during activities of daily living (OT) Flowsheets (Taken 08/14/2022 1749) LTG: Pt will maintain dynamic sitting balance during ADLs with: Supervision/Verbal cueing   Problem: Sit to Stand Goal: LTG:  Patient will perform sit to stand in prep for activites of daily living with assistance level (OT) Description: LTG:  Patient will perform sit to stand in prep for activites of daily living with assistance level (OT) Flowsheets (Taken 08/14/2022 1749) LTG: PT will perform sit to stand in prep for activites of daily living with assistance level: Minimal Assistance - Patient > 75%   Problem: RH Eating Goal: LTG Patient will perform eating w/assist, cues/equip (OT) Description: LTG: Patient will perform eating with assist, with/without cues using equipment (OT) Flowsheets (Taken 08/14/2022 1749) LTG: Pt will perform eating with assistance level of: Supervision/Verbal cueing   Problem: RH Grooming Goal: LTG Patient will perform grooming w/assist,cues/equip (OT) Description: LTG: Patient will perform grooming with assist, with/without cues using equipment (OT) Flowsheets (Taken 08/14/2022 1749) LTG: Pt will perform grooming with assistance level of: Supervision/Verbal cueing   Problem: RH Bathing Goal: LTG Patient will bathe all body parts with assist levels (OT) Description: LTG: Patient will bathe all body parts with assist levels (OT) Flowsheets (Taken 08/14/2022 1749) LTG: Pt will perform bathing with assistance level/cueing: Moderate Assistance - Patient 50 - 74%   Problem: RH Dressing Goal: LTG Patient will perform upper body dressing (OT) Description: LTG Patient will perform upper body dressing with assist, with/without cues (OT). Flowsheets (Taken 08/14/2022 1749) LTG: Pt will perform upper body dressing with assistance level of:  Supervision/Verbal cueing Goal: LTG Patient will perform lower body dressing w/assist (OT) Description: LTG: Patient will perform lower body dressing with assist, with/without cues in positioning using equipment (OT) Flowsheets (Taken 08/14/2022 1749) LTG: Pt will perform lower body dressing with assistance level of: Moderate Assistance - Patient 50 - 74%   Problem: RH Toileting Goal: LTG Patient will perform toileting task (3/3 steps) with assistance level (OT) Description: LTG: Patient will perform toileting task (3/3 steps) with assistance level (OT)  Flowsheets (Taken 08/14/2022 1749) LTG: Pt will perform toileting task (3/3 steps) with assistance level: Moderate Assistance - Patient 50 - 74%   Problem: RH Attention Goal: LTG Patient will demonstrate this level of attention during functional activites (OT) Description: LTG:  Patient will demonstrate this level of attention during functional activites  (OT) Flowsheets (Taken 08/14/2022 1749) Patient will demonstrate this level of attention during functional activites: Selective Patient will demonstrate above attention level in the following environment: Home LTG: Patient will demonstrate this level of attention during functional activites (OT): Supervision   Problem: RH Awareness Goal: LTG: Patient will demonstrate awareness during functional activites type of (OT) Description: LTG: Patient will demonstrate awareness during functional activites type of (OT) Flowsheets (Taken 08/14/2022 1749) Patient will demonstrate awareness during functional activites type of: Emergent LTG: Patient will demonstrate awareness during functional activites type of (OT): Supervision

## 2022-08-14 NOTE — Progress Notes (Signed)
Patient and family at bedside refused patient's meds, except for aspirin, stating, "she's here for therapy and those meds make her lethargic." Patient and family member educated. MD made aware.

## 2022-08-14 NOTE — Patient Care Conference (Signed)
Inpatient RehabilitationTeam Conference and Plan of Care Update Date: 08/15/2022   Time: 11:11 AM    Patient Name: [name] Record Number: [id number]  Date of Birth: [birth year] Sex: Female         Room/Bed: 4W19C/4W19C-01 Payor Info: Payor: [employer] MEDICARE / Plan: [employer] MEDICARE CHOICE PPO / Product Type: *No Product type* /    Admit Date/Time:  08/13/2022  1:48 PM  Primary Diagnosis:  Lateral medullary syndrome  Hospital Problems: Principal Problem:   Lateral medullary syndrome Active Problems:   Left hemiparesis (HCC)   Spasticity as late effect of cerebrovascular accident (CVA)    Expected Discharge Date: Expected Discharge Date:  (evals pending)  Team Members Present: Physician leading conference: Dr. Leeroy Cha Social Worker Present: Ovidio Kin, LCSW Nurse Present: Dorien Chihuahua, RN PT Present: Ginnie Smart, PT OT Present: Willeen Cass, OT SLP Present: Helaine Chess, SLP PPS Coordinator present : Gunnar Fusi, SLP     Current Status/Progress Goal Weekly Team Focus  Bowel/Bladder     Requires BVI/I+O caths q 8 hrs post foley removal Continent of bowel    Voiding continently    Bladder scans q 8 and intermittent caths, MD adjusting medication  Swallow/Nutrition/ Hydration               ADL's   pending evals   mod A overall   sit to stands , transfer training, sitting balance, activity tolerance, ADL retraining    Mobility   max/totalA bed mobility, maxA squat<>pivot transfers, maxA sit<>stand in // bars, maxA w/c mobility   modA at wheelchair level  bed mobility, functional transfers, standing and progressing gait as able, ongoing caregiver training    Communication                Safety/Cognition/ Behavioral Observations               Pain      N/a          Skin     PRAFO for right ankle, skin tear left elbow   Skin healing     Assess skin q shift     Discharge Planning:  new evaluation plan to  return home with daughter who was assisting with her care prior to admission. Was going to OP and aquatics and hope to get back to this   Team Discussion: Patient with concerns about medications; holistic treatment preferred, for left medullary syndrome. MD discontinue trazodone and zoloft and adjusted BP medication dosage.  , D/C flomax and add IVF at HS. Patient was limited PTA, contracture left UE.  Patient on target to meet rehab goals: Currently needs max assist for squat pivot and sit - stand transfers.  Evals pending  *See Care Plan and progress notes for long and short-term goals.   Revisions to Treatment Plan:  Palliative Care consult Resting hand splint ordered   Teaching Needs: Safety, transfers, toileting, medications, dietary modification, etc.   Current Barriers to Discharge: Decreased caregiver support  Possible Resolutions to Barriers: Family education OP follow up services     Medical Summary Current Status: constipation, overweight, multiple strokes, urinary retention, lethargy, contracture, suboptimal vitamin D  Barriers to Discharge: Medical stability  Barriers to Discharge Comments: constipation, overweight, multiple strokes, urinary retention, lethargy, contracture, suboptimal vitamin D Possible Resolutions to [employer] Focus: d/c miralax, d/c oxycodone, d/c trazodone, d/c zolfot, discussed neurostimulants, starr ergocalciferol 50,000U once per week for 7 weeks, check iron and B12  Continued Need for Acute Rehabilitation Level of Care: The patient requires daily medical management by a physician with specialized training in physical medicine and rehabilitation for the following reasons: Direction of a multidisciplinary physical rehabilitation program to maximize functional independence : Yes Medical management of patient stability for increased activity during participation in an intensive rehabilitation regime.: Yes Analysis of laboratory values and/or  radiology reports with any subsequent need for medication adjustment and/or medical intervention. : Yes   I attest that I was present, lead the team conference, and concur with the assessment and plan of the team.   Dorien Chihuahua B 08/15/2022, 7:39 AM

## 2022-08-14 NOTE — Evaluation (Signed)
Physical Therapy Assessment and Plan  Patient Details  Name: [name] MRN: [id number] Date of Birth: 13-Dec-1945  PT Diagnosis: Abnormal posture, Abnormality of gait, Difficulty walking, Hemiplegia non-dominant, Hypertonia, and Muscle weakness Rehab Potential: Poor ELOS: 10-14 days   Today's Date: 08/14/2022 PT Individual Time: 0915-1025 PT Individual Time Calculation (min): 70 min    Hospital Problem: Principal Problem:   Lateral medullary syndrome Active Problems:   Left hemiparesis (HCC)   Spasticity as late effect of cerebrovascular accident (CVA)   Past Medical History:  Past Medical History:  Diagnosis Date   Allergy    Arthritis    Broken ankle    Cataract    Diabetes mellitus without complication ([place])    History of colonoscopy    Hypertension    Macular degeneration syndrome    with edema---being treated.    Stage 4 chronic kidney disease ([place])    Stroke ([place]) 2019   Past Surgical History:  Past Surgical History:  Procedure Laterality Date   callous removal  Left 2017   located on 1st digit on L foot 2/2 diabetes   EYE SURGERY      Assessment & Plan Clinical Impression: Patient is a 77 year old right-handed female with history significant for CVA with left-sided residual weakness maintained on low-dose aspirin and received inpatient rehab services 02/27/2018 - 03/14/2018, hypertension, hyperlipidemia, type 2 diabetes mellitus, CKD stage III, ankle fracture 1 year ago and wears a right ankle brace, recent UTI.  Per chart review patient lives with family and assistance as needed.  Patient ambulates with a platform rolling walker.  She was attending aquatics 2 times a week.  Daughter does assist with some ADLs.  Presented 08/07/2022 with generalized weakness decrease in appetite.  Patient had initially been seen in the ER 1 week prior for generalized weakness CT at that time showed no acute findings UA showed WBC greater than 50,000 she was placed on antibiotic  therapy and discharged to home.  Daughter noted worsening of left facial droop with slurred speech not able to keep food down and intermittent nausea vomiting.  She presented back to the ED with CT/MRI showing a 4 mm acute left medullary infarction.  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  KUB showed no evidence of bowel obstruction or other acute findings.  Incidental findings of nonobstructive renal stone.  Admission chemistries unremarkable except potassium 3.4 glucose 190 BUN 38 creatinine 1.63, WBC 13,700.  Urinalysis negative nitrite, hemoglobin A1c 7.8.  Echo with ejection fraction of 60 to 65% no wall motion abnormalities grade 2 diastolic dysfunction no regional wall motion abnormalities.  Neurology follow-up maintained on low-dose aspirin with the addition of Plavix for CVA prophylaxis.  Lovenox for DVT prophylaxis x 3 weeks then aspirin alone.  She is currently on a mechanical soft nectar thick liquid diet.  Hospital course bouts of urinary retention Foley tube maintained through 2/3 and removed but failed voiding trial and replaced 2/4.  AKI on CKD stage IV creatinine increasing to 2.0 suspect due to urinary retention placed on gentle hydration and losartan was held.  Therapy evaluations completed due to patient decreased functional mobility and dysphagia was admitted for a comprehensive rehab program. Patient transferred to CIR on 08/13/2022 .   Patient currently requires max with mobility secondary to muscle weakness and muscle joint tightness, decreased cardiorespiratoy endurance, abnormal tone, unbalanced muscle activation, and motor apraxia, and decreased sitting balance, decreased standing balance, decreased postural control, hemiplegia, and  decreased balance strategies.  Prior to hospitalization, patient was mod with mobility and lived with Daughter in a House home.  Home access is  Ramped entrance.  Patient will benefit from skilled  PT intervention to maximize safe functional mobility, minimize fall risk, and decrease caregiver burden for planned discharge home with 24 hour assist.  Anticipate patient will benefit from follow up [facility] at discharge.  PT - End of Session Activity Tolerance: Tolerates < 10 min activity, no significant change in vital signs Endurance Deficit: Yes PT Assessment Rehab Potential (ACUTE/IP ONLY): Poor PT Barriers to Discharge: Insurance for SNF coverage PT Patient demonstrates impairments in the following area(s): Balance;Endurance;Motor;Perception;Skin Integrity;Safety PT Transfers Functional Problem(s): Bed to Chair;Car;Bed Mobility PT Locomotion Functional Problem(s): Ambulation;Wheelchair Mobility PT Plan PT Intensity: Minimum of 1-2 x/day ,45 to 90 minutes PT Frequency: 5 out of 7 days PT Duration Estimated Length of Stay: 10-14 days PT Treatment/Interventions: Ambulation/gait training;Balance/vestibular training;Cognitive remediation/compensation;Community reintegration;Discharge planning;Disease management/prevention;Functional electrical stimulation;DME/adaptive equipment instruction;Functional mobility training;Neuromuscular re-education;Pain management;Patient/family education;Psychosocial support;Skin care/wound management;Splinting/orthotics;Stair training;Therapeutic Activities;Therapeutic Exercise;UE/LE Strength taining/ROM;UE/LE Coordination activities;Visual/perceptual remediation/compensation;Wheelchair propulsion/positioning PT Transfers Anticipated Outcome(s): modA with LRAD PT Locomotion Anticipated Outcome(s): anticipate primarily wheelchair level PT Recommendation Follow Up Recommendations: Home health PT;24 hour supervision/assistance Patient destination: Home Equipment Recommended: To be determined   PT Evaluation Precautions/Restrictions Precautions Precautions: Fall Precaution Comments: premorbid L hemi Required Braces or Orthoses: Other Brace Other Brace: R ankle  stabilizing brace from prior ankle fx Restrictions Weight Bearing Restrictions: No Other Position/Activity Restrictions: Old L sided weakness from a prior CVA from ~3 years ago. History of R abkle fracture ~1-1.5 yr ago General   Vital SignsTherapy Vitals Temp Source: Oral Pulse Rate: 80 BP: (!) 143/59 Patient Position (if appropriate): Sitting Oxygen Therapy SpO2: 100 % O2 Device: Room Air Pain   Reports no pain Pain Interference Pain Interference Pain Effect on Sleep: 0. Does not apply - I have not had any pain or hurting in the past 5 days Pain Interference with Therapy Activities: 0. Does not apply - I have not received rehabilitationtherapy in the past 5 days Pain Interference with Day-to-Day Activities: 1. Rarely or not at all Home Living/Prior [place] Available Help at Discharge: Family;Available 24 hours/day Type of Home: House Home Access: Ramped entrance Home Layout: One level Bathroom Shower/Tub: Multimedia programmer: Handicapped height Bathroom Accessibility: Yes Additional Comments: Primarily used the wheelchair for mobility. Only would walk with therapy using platform walker  Lives With: Daughter Prior Function Level of Independence: Needs assistance with ADLs;Needs assistance with homemaking;Needs assistance with gait;Needs assistance with tranfers Bath: Maximal Toileting: Maximal Dressing: Maximal  Able to Take Stairs?: No Driving: No Vocation: Retired Radiographer, therapeutic - History Ability to See in Adequate Light: 0 Adequate Perception Inattention/Neglect: Does not attend to left side of body Praxis Praxis: Impaired Praxis Impairment Details: Initiation;Motor planning  Cognition Orientation Level: Oriented X4 Year: 2024 Month: February Day of Week: Correct Attention: Focused;Sustained;Selective Focused Attention: Appears intact Sustained Attention: Appears intact Selective Attention: Impaired Selective Attention  Impairment: Verbal basic Sensation Sensation Light Touch: Appears Intact Hot/Cold: Appears Intact Proprioception: Impaired by gross assessment Stereognosis: Not tested Coordination Gross Motor Movements are Fluid and Coordinated: No Fine Motor Movements are Fluid and Coordinated: No Coordination and Movement Description: L hemi (premorbid). UE > LE Motor  Motor Motor: Hemiplegia;Abnormal tone;Motor apraxia;Abnormal postural alignment and control   Trunk/Postural Assessment  Cervical Assessment Cervical Assessment: Exceptions to [facility] (forward head) Thoracic Assessment Thoracic Assessment: Exceptions to [facility] (rounded shoulders, flexed  trunk, kyphotic) Lumbar Assessment Lumbar Assessment: Exceptions to [facility] (posterior tilted pelvis) Postural Control Postural Control: Deficits on evaluation Trunk Control: posterior bias, retropulsion Righting Reactions: delayed & insufficient  Balance Balance Balance Assessed: Yes Static Sitting Balance Static Sitting - Balance Support: Feet supported;No upper extremity supported Static Sitting - Level of Assistance: 5: Stand by assistance Dynamic Sitting Balance Dynamic Sitting - Balance Support: Feet supported;No upper extremity supported Dynamic Sitting - Level of Assistance: 4: Min assist;3: Mod assist Static Standing Balance Static Standing - Balance Support: Bilateral upper extremity supported Static Standing - Level of Assistance: 2: Max assist Dynamic Standing Balance Dynamic Standing - Balance Support: Bilateral upper extremity supported Dynamic Standing - Level of Assistance: 2: Max assist Extremity Assessment      RLE Assessment RLE Assessment: Exceptions to [facility] RLE Strength RLE Overall Strength: Deficits;Due to premorbid status Right Hip Flexion: 2+/5 Right Hip Extension: 2+/5 Right Hip ABduction: 3-/5 Right Hip ADduction: 3/5 Right Knee Extension: 3+/5 Right Ankle Dorsiflexion: 2/5 LLE Assessment LLE Assessment: Exceptions  to WFL LLE Strength LLE Overall Strength: Deficits;Due to premorbid status Left Hip Flexion: 2/5 Left Hip Extension: 2/5 Left Hip ABduction: 2+/5 Left Hip ADduction: 2+/5 Left Knee Flexion: 2/5 Left Knee Extension: 3-/5 Left Ankle Dorsiflexion: 2+/5  Care Tool Care Tool Bed Mobility Roll left and right activity   Roll left and right assist level: Maximal Assistance - Patient 25 - 49%    Sit to lying activity   Sit to lying assist level: Maximal Assistance - Patient 25 - 49%    Lying to sitting on side of bed activity   Lying to sitting on side of bed assist level: the ability to move from lying on the back to sitting on the side of the bed with no back support.: Maximal Assistance - Patient 25 - 49%     Care Tool Transfers Sit to stand transfer   Sit to stand assist level: Total Assistance - Patient < 25%    Chair/bed transfer   Chair/bed transfer assist level: Maximal Assistance - Patient 25 - 49%     Psychologist, counselling transfer activity did not occur: Safety/medical concerns        Care Tool Locomotion Ambulation Ambulation activity did not occur: Safety/medical concerns        Walk 10 feet activity Walk 10 feet activity did not occur: Safety/medical concerns       Walk 50 feet with 2 turns activity Walk 50 feet with 2 turns activity did not occur: Safety/medical concerns      Walk 150 feet activity Walk 150 feet activity did not occur: Safety/medical concerns      Walk 10 feet on uneven surfaces activity Walk 10 feet on uneven surfaces activity did not occur: Safety/medical concerns      Stairs Stair activity did not occur: Safety/medical concerns        Walk up/down 1 step activity Walk up/down 1 step or curb (drop down) activity did not occur: Safety/medical concerns      Walk up/down 4 steps activity Walk up/down 4 steps activity did not occur: Safety/medical concerns      Walk up/down 12 steps activity Walk up/down 12 steps  activity did not occur: Safety/medical concerns      Pick up small objects from floor Pick up small object from the floor (from standing position) activity did not occur: Safety/medical concerns      Wheelchair Is the patient  using a wheelchair?: Yes Type of Wheelchair: Manual   Wheelchair assist level: Maximal Assistance - Patient 25 - 49% Max wheelchair distance: 21ft  Wheel 50 feet with 2 turns activity   Assist Level: Total Assistance - Patient < 25%  Wheel 150 feet activity   Assist Level: Total Assistance - Patient < 25%    Refer to Care Plan for Long Term Goals  SHORT TERM GOAL WEEK 1 PT Short Term Goal 1 (Week 1): Pt will complete bed mobility with modA PT Short Term Goal 2 (Week 1): Pt will complete bed<>chair transfers with modA and LRAD PT Short Term Goal 3 (Week 1): Pt will ambulate 46ft with maxA and LRAD  Recommendations for other services: None   Skilled Therapeutic Intervention Mobility Bed Mobility Bed Mobility: Rolling Right;Rolling Left;Supine to Sit;Sit to Supine Rolling Right: Maximal Assistance - Patient 25-49% Rolling Left: Maximal Assistance - Patient 25-49% Supine to Sit: Maximal Assistance - Patient - Patient 25-49% Sit to Supine: Maximal Assistance - Patient 25-49% Transfers Transfers: Sit to Stand;Stand to Sit;Squat Pivot Transfers Sit to Stand: Maximal Assistance - Patient 25-49% Stand to Sit: Maximal Assistance - Patient 25-49% Squat Pivot Transfers: Moderate Assistance - Patient 50-74%;Maximal Assistance - Patient 25-49% Transfer (Assistive device): None Locomotion  Gait Ambulation: No Gait Gait: No Stairs / Additional Locomotion Stairs: No Wheelchair Mobility Wheelchair Mobility: Yes Wheelchair Assistance: Maximal Assistance - Patient 25 - 49% Wheelchair Propulsion: Right upper extremity;Right lower extremity Wheelchair Parts Management: Needs assistance Distance: 92ft  Skilled Intervention: Pt in bed to start without family at  bedside. Patient alert and oriented x4, difficult to understand due to dysarthria. Pleasant with history taking and during mobility tasks. Reports she lives with her daughter who provides total care with ADLs - uses transport wheelchair in the home. Ramped entrance, 1 level. Patient reports her goals for CIR are to walk again and improve self sufficiency. Donned pants at bed level with maxA - pt attempting to bridge but unable to lift hips even with PT facilitating. Supine<>sitting EOB with maxA with hospital bed features - pt uses temperpedic bed at home. Donned tennis shoes with totalA for time. Squat<>pivot transfer to w/c with mod/maxA towards her stronger R side with cues for setup, hand placement and sequencing. Transported to main rehab gym, inside // bars. Worked on sit<>stands, standing balance/posture. Patient needing max/totalA for sit<>stand in // bars and maxA for standing balance due to retropulsion and R trunk lean. Unable to safely progress gait this session due to balance and standing deficits. Assisted back to bed at end of session with maxA squat<>pivot transfer. totalA needed for sit> supine and daughter assisting with scooting to [facility]. MD arriving at end of session, all needs met. Retrieved ROHO cushion for her w/c.   Discharge Criteria: Patient will be discharged from PT if patient refuses treatment 3 consecutive times without medical reason, if treatment goals not met, if there is a change in medical status, if patient makes no progress towards goals or if patient is discharged from hospital.  The above assessment, treatment plan, treatment alternatives and goals were discussed and mutually agreed upon: by patient  Alger Simons PT, DPT 08/14/2022, 10:31 AM

## 2022-08-14 NOTE — Plan of Care (Signed)
  Problem: RH Swallowing Goal: LTG Patient will consume least restrictive diet using compensatory strategies with assistance (SLP) Description: LTG:  Patient will consume least restrictive diet using compensatory strategies with assistance (SLP) Flowsheets (Taken 08/14/2022 1554) LTG: Pt Patient will consume least restrictive diet using compensatory strategies with assistance of (SLP): Minimal Assistance - Patient > 75%   Problem: RH Expression Communication Goal: LTG Patient will increase speech intelligibility (SLP) Description: LTG: Patient will increase speech intelligibility at word/phrase/conversation level with cues, % of the time (SLP) Flowsheets (Taken 08/14/2022 1554) LTG: Patient will increase speech intelligibility (SLP): Supervision Level: (Sentence) Other (Comment) Percent of time patient will use intelligible speech: 90

## 2022-08-14 NOTE — Progress Notes (Signed)
Inpatient Rehabilitation  Patient information reviewed and entered into eRehab system by Leondre Taul M. Nakyah Erdmann, M.A., CCC/SLP, PPS Coordinator.  Information including medical coding, functional ability and quality indicators will be reviewed and updated through discharge.    

## 2022-08-15 ENCOUNTER — Ambulatory Visit: Payer: Medicare PPO | Admitting: Rehabilitation

## 2022-08-15 LAB — GLUCOSE, CAPILLARY
Glucose-Capillary: 143 mg/dL — ABNORMAL HIGH (ref 70–99)
Glucose-Capillary: 202 mg/dL — ABNORMAL HIGH (ref 70–99)

## 2022-08-15 MED ORDER — SENNOSIDES-DOCUSATE SODIUM 8.6-50 MG PO TABS
1.0000 | ORAL_TABLET | Freq: Every day | ORAL | Status: DC
  Administered 2022-08-15 – 2022-09-03 (×12): 1 via ORAL
  Filled 2022-08-15 (×15): qty 1

## 2022-08-15 MED ORDER — SORBITOL 70 % SOLN
30.0000 mL | Freq: Once | Status: AC
  Administered 2022-08-15: 30 mL via ORAL
  Filled 2022-08-15: qty 30

## 2022-08-15 MED ORDER — HYDRALAZINE HCL 25 MG PO TABS
25.0000 mg | ORAL_TABLET | Freq: Three times a day (TID) | ORAL | Status: DC
  Administered 2022-08-15 – 2022-08-16 (×4): 25 mg via ORAL
  Filled 2022-08-15 (×4): qty 1

## 2022-08-15 MED ORDER — SODIUM CHLORIDE 0.9 % IV SOLN: INTRAVENOUS | Status: AC

## 2022-08-15 NOTE — Progress Notes (Signed)
Occupational Therapy Session Note  Patient Details  Name: [name] MRN: [id number] Date of Birth: 1946/06/06  Today's Date: 08/15/2022 OT Individual Time: 0815-0930 OT Individual Time Calculation (min): 75 min    Short Term Goals: Week 1:  OT Short Term Goal 1 (Week 1): Pt will transfer to [facility] with max A +1 consistently OT Short Term Goal 2 (Week 1): Pt will don shirt with min A OT Short Term Goal 3 (Week 1): Pt will sit unsupported during functional task with supervision OT Short Term Goal 4 (Week 1): Pt will perform sit to stand with max A consistently for clothing management with LRAD  Skilled Therapeutic Interventions/Progress Updates:     Pt received in bed with daughter present and no pain  ADL: Pt completes ADL at overall S-MAX A Level bed/EOB. Skilled interventions include:   Daughter dons socks, shoes and ankle brace  OT threads BLE into pants and pt rolls to doff old wet brief, dons new brief total A rolling with MOD A/VC for reaching across for bed rails, and bridges hips to advance pants pasthips.  Pt is able ot sit at EOB with MIN A overall and cuing for midline orientation while donning shirt with MIN A  MAX A SPT with no AD to w/c with initlaly good power up heavy mod/light max, however needs increased assist with scooting back to taller 18x18 TIS.  Throughout session OT trials 2 different w/cs eventually settling on 16x16 TIS to improve OOB tolerance and provides education on pressure relief strategies as well as brake management with daughter.   Therapeutic activity Transferred to new chair with MAX A+2 for large pivot to new 16x16 chair  Box and Blocks Test measures unilateral gross manual dexterity. - Instructions The pt was instructed to carry one block over at a time and go as quickly as they could, making sure their fingertips crossed the partition. One minute was given to complete the task per UE. The pt was allowed a 15-second trial period prior to  testing if needed. - Results The pt transferred 10 blocks with the R hand and 10 with the L hand. The total number of blocks carried from one compartment to the other in one minute is scored per hand. Higher scores on the test indicate better gross manual dexterity.  - Norms for adults females 50-75+ 50-54 R 77.7 L 74.3 55-59 R 74.7 L 73.6 60-64 R 76.1 L 73.6 65-69 R 72 L 71.3 70-74 R 68.6 L 68.3 75+ R 65.0 L 63.6   Pt left at end of session in TIS with daughter supervising, call light in reach and all needs met   Therapy Documentation Precautions:  Precautions Precautions: Fall Precaution Comments: premorbid L hemi Required Braces or Orthoses: Other Brace Other Brace: R ankle stabilizing brace from prior ankle fx Restrictions Weight Bearing Restrictions: No RLE Weight Bearing: Weight bearing as tolerated LLE Weight Bearing: Weight bearing as tolerated Other Position/Activity Restrictions: Old L sided weakness from a prior CVA from ~3 years ago. History of R abkle fracture ~1-1.5 yr ago General:    Therapy/Group: Individual Therapy  Tonny Branch 08/15/2022, 6:49 AM

## 2022-08-15 NOTE — Progress Notes (Signed)
Patient ID: [name], female   DOB: June 22, 1946, 77 y.o.   MRN: [id number] Met with the patient and daughter to review current situation,  rehab program, team conference and plan of care. Reviewed secondary risks management, reviewed medications, dietary modifications for  HTN, HLD, DM and CKD. Continue to follow along to address educational needs to facilitate preparation for discharge. Margarito Liner

## 2022-08-15 NOTE — Progress Notes (Signed)
Orthopedic Tech Progress Note Patient Details:  [name] May 27, 1946 [id number] Called in order to [facility] for resting hand splint Patient ID: [name], female   DOB: [birth year], 77 y.o.   MRN: [id number]  [name] 08/15/2022, 9:49 AM

## 2022-08-15 NOTE — Progress Notes (Signed)
Speech Language Pathology Daily Session Note  Patient Details  Name: [name] MRN: [id number] Date of Birth: Sep 12, 1945  Today's Date: 08/15/2022 SLP Individual Time: 1100-1155 SLP Individual Time Calculation (min): 55 min  Short Term Goals: Week 1: SLP Short Term Goal 1 (Week 1): Pt will participate in therapeutic trials of thin liquid via tsp with no s/sx concerning for airway intrusion to assess readiness for repeat swallow study. SLP Short Term Goal 2 (Week 1): Pt will demonstrate tolerance of current diet textures with minimal s/sx concerning for airway intrusion with Mod A. SLP Short Term Goal 3 (Week 1): Pt will utilize speech intelligibility strategies to improve intelligibilty at the phrase and sentence level to 90%. SLP Short Term Goal 4 (Week 1): Pt will complete formal cognitive-linguistic evaluation to further determine ST POC with 100% completion.  Skilled Therapeutic Interventions: Pt seen this date for skilled ST intervention targeting swallowing and speech intelligibility goals outlined in care plan. Pt received reclined in TIS with eyes closed; aroused to name and repositioning in  TIS. Agreeable to intervention in hospital room.   Today's session with emphasis on therapeutic PO trials of thin liquid via tsp and functional communication.  Re: deglutition, oral care completed via suction toothbrush with Mod A for thoroughness. Tolerated thin water via tsp with only two instances of throat clearing with ~1 oz of water. SLP administered water to assist with volume and fine motor control. When pt self-fed ice chips she got too many and had to spit some out, at the request of SLP. Continues to benefit from verbal cue "1, 2, 3, swallow" for swallow initiation. Intermittent drooling noted with pt benefiting from verbal cues from SLP to self-correct. Will continue thin water trials with SLP only at this time. Provided education to pt and pt's daughter re: rationale for thin water  in therapy only and importance of oral care.   Re: speech intelligibility, SLP provided verbal and written education re: speech intelligibility strategies (e.g. A BOSS strategies) with pt implementing at the word and phrase level with verbal and model prompts from SLP. With implementation of strategies, pt's speech intelligibility improved from ~85% to 90%. Worked on mass practice and use of dB meter to aid in self-monitoring, self-correcting, and generalization of strategies. Will continue to need reinforcement, skilled cueing, and mass practice of interventions. Cognitively, pt was oriented x 4 independently and demonstrated functional recall of AM events. Pt and pt's family deny cognitive changes with this CVA.  Pt left in room and OOB in TIS with all safety measures activated and call bell within reach. Daughter remained at bedside Continue per current ST POC.   Pain No pain reported; NAD  Therapy/Group: Individual Therapy  Raffaela Ladley A Dynastee Brummell 08/15/2022, 7:47 PM

## 2022-08-15 NOTE — Progress Notes (Signed)
Patient ID: [name], female   DOB: 01-08-1946, 77 y.o.   MRN: [id number]  Patient given 1 time dose of Sorbitol. Patient proceeded to have coughing spell which led to vomiting episode. Patient began vomiting saliva and mucus. Continued to cough, an extremely deep cough, and began vomiting undigested food. Daughter at bedside holding emesis bag and suctioning patient's extra saliva. Daughter staying at bedside. Will continue to monitor patient.

## 2022-08-15 NOTE — Progress Notes (Signed)
Physical Therapy Session Note  Patient Details  Name: LORILEE CAFARELLA MRN: [id number] Date of Birth: 1946-02-14  Today's Date: 08/15/2022 PT Individual Time: 1300-1400 PT Individual Time Calculation (min): 60 min   Short Term Goals: Week 1:  PT Short Term Goal 1 (Week 1): Pt will complete bed mobility with modA PT Short Term Goal 2 (Week 1): Pt will complete bed<>chair transfers with modA and LRAD PT Short Term Goal 3 (Week 1): Pt will ambulate 88ft with maxA and LRAD  Skilled Therapeutic Interventions/Progress Updates:      Pt sitting in TIS w/c to start, slightly reclined for comfort. Patient in agreement to therapy session - denies pain. Transported in w/c to main rehab gym. Setup in Standing Frame to facilitate standing, weight bearing bilaterally, and offloading her buttock. Used mirror for visual feedback in standing to promote attention to posture. Dependent to total for standing in the frame with assist for positioning UE 's for safety. Patient able to tolerate standing for 1-2 minutes per stand, benefiting from cues for posture and for facilitating trunk extension. When hip sling was relaxed, patient was unable to produce adequate hip extension for upright.  Positioned outside of // bars to face large mirror and use only 1 of the // bars to work on pulling to stand. Patient needed mod/maxA for pulling to stand and then needing minA for standing with BUE support to bar. Flexed hip/trunks, flexed cervical posture as well. Decreased standing tolerance in this position, up to 1 minute max.   Pt returned to her room and concluded session reclined in TIS w/c. K-pad applied to lower back. Daughter at the bedside and all needs met.   Therapy Documentation Precautions:  Precautions Precautions: Fall Precaution Comments: premorbid L hemi Required Braces or Orthoses: Other Brace Other Brace: R ankle stabilizing brace from prior ankle fx Restrictions Weight Bearing Restrictions: No RLE  Weight Bearing: Weight bearing as tolerated LLE Weight Bearing: Weight bearing as tolerated Other Position/Activity Restrictions: Old L sided weakness from a prior CVA from ~3 years ago. History of R abkle fracture ~1-1.5 yr ago General:      Therapy/Group: Individual Therapy  Rossy Virag P Taffy Delconte PT 08/15/2022, 7:38 AM

## 2022-08-15 NOTE — Progress Notes (Signed)
Patient ID: [name], female   DOB: April 14, 1946, 77 y.o.   MRN: [id number]  Patient's daughter refused her morning dose of Ezetimibe and Fenofibrate. No issues with taking Aspirin and Plavix.

## 2022-08-15 NOTE — Progress Notes (Signed)
PROGRESS NOTE   Subjective/Complaints: No new complaints this morning, she still feels tired Reviewed labs with daughter, starting a vitamin D supplement  ROS: +lethargy as per daughter, cognition intact as per team   Objective:   CT HEAD WO CONTRAST (5MM)  Result Date: 08/14/2022 CLINICAL DATA:  Initial evaluation for neuro deficit, stroke suspected. EXAM: CT HEAD WITHOUT CONTRAST TECHNIQUE: Contiguous axial images were obtained from the base of the skull through the vertex without intravenous contrast. RADIATION DOSE REDUCTION: This exam was performed according to the departmental dose-optimization program which includes automated exposure control, adjustment of the mA and/or kV according to patient size and/or use of iterative reconstruction technique. COMPARISON:  Prior MRI from 08/07/2022. FINDINGS: Brain: Age-related cerebral atrophy with chronic small vessel ischemic disease. Chronic posterior right MCA distribution infarct normal the right parietal lobe. Scattered remote lacunar infarcts present about the right basal ganglia. Multiple chronic cerebellar infarcts, right greater than left. Previously identified left medullary infarct not visible by CT. No other acute large vessel territory infarct. No acute intracranial hemorrhage. No mass lesion or midline shift. Ventricular prominence related global parenchymal volume loss of hydrocephalus. No extra-axial fluid collection. Vascular: No abnormal hyperdense vessel. Calcified atherosclerosis present at the skull base. Skull: Scalp soft tissues and calvarium within normal limits. Sinuses/Orbits: Globes orbital soft tissues within normal limits. Paranasal sinuses are largely clear. No mastoid effusion. Other: None. IMPRESSION: 1. No acute intracranial abnormality. Recently identified small left medullary infarct not visible by CT. 2. Age-related cerebral atrophy with extensive chronic ischemic  changes as above, stable. Electronically Signed   By: Jeannine Boga M.D.   On: 08/14/2022 19:07   Recent Labs    08/13/22 1446 08/14/22 0730  WBC 8.6 7.0  HGB 14.2 13.0  HCT 43.2 39.5  PLT 278 266   Recent Labs    08/13/22 0430 08/13/22 1446 08/14/22 0730  NA 138  --  139  K 3.5  --  3.4*  CL 111  --  110  CO2 19*  --  17*  GLUCOSE 142*  --  193*  BUN 28*  --  24*  CREATININE 1.71* 1.75* 2.07*  CALCIUM 8.1*  --  8.4*    Intake/Output Summary (Last 24 hours) at 08/15/2022 1109 Last data filed at 08/15/2022 0935 Gross per 24 hour  Intake 616.07 ml  Output 0 ml  Net 616.07 ml        Physical Exam: Vital Signs Blood pressure (!) 181/63, pulse 79, temperature 98 F (36.7 C), resp. rate 16, height 5\' 2"  (1.575 m), weight 65.1 kg, SpO2 99 %. Constitutional:      Comments: Frail appearing; awake, somewhat alert, but very little speech; Sitting up in bed; daughter at bedside, NAD, BMI 26.69 HENT:     Head: Normocephalic and atraumatic.     Comments: L facial droop- from prior stroke Tongue midline    Right Ear: External ear normal.     Left Ear: External ear normal.     Nose: Nose normal. No congestion.     Mouth/Throat:     Mouth: Mucous membranes are dry.     Pharynx: Oropharynx is clear. No oropharyngeal exudate.  Eyes:     General:        Right eye: No discharge.        Left eye: No discharge.     Extraocular Movements: Extraocular movements intact.     Comments: Nystagmus to L>R  Cardiovascular:     Rate and Rhythm: Normal rate and regular rhythm.     Heart sounds: Normal heart sounds. No murmur heard.    No gallop.  Pulmonary:     Effort: No respiratory distress.     Breath sounds: Normal breath sounds. No wheezing, rhonchi or rales.  Abdominal:     General: Bowel sounds are normal. There is no distension.     Palpations: Abdomen is soft.     Tenderness: There is no abdominal tenderness.  Genitourinary:    Comments: Foley in place; medium amber  urine Musculoskeletal:     Cervical back: Neck supple. No tenderness.     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  Speech is mildly dysarthric but intelligible.  Follows simple commands.  Provides name and age. Finger to nose on R side ok, but cannot test L Denies dizziness  Psychiatric:     Comments: Flat, decreased interaction  GU: voiding on own!  Assessment/Plan: 1. Functional deficits which require 3+ hours per day of interdisciplinary therapy in a comprehensive inpatient rehab setting. Physiatrist is providing close team supervision and 24 hour management of active medical problems listed below. Physiatrist and rehab team continue to assess barriers to discharge/monitor patient progress toward functional and medical goals  Care Tool:  Bathing    Body parts bathed by patient: Left arm, Chest, Abdomen, Right upper leg, Left upper leg, Face   Body parts bathed by helper: Right arm, Front perineal area, Buttocks, Right lower leg, Left lower leg     Bathing assist Assist Level: Maximal Assistance - Patient 24 - 49%     Upper Body Dressing/Undressing Upper body dressing   What is the patient wearing?: Pull over shirt    Upper body assist Assist Level: Moderate Assistance - Patient 50 - 74%    Lower Body Dressing/Undressing Lower body dressing      What is the patient wearing?: Underwear/pull up, Pants     Lower body assist Assist for lower body dressing: Total Assistance - Patient < 25%     Toileting Toileting    Toileting assist Assist for toileting: 2 Helpers     Transfers Chair/bed transfer  Transfers assist     Chair/bed transfer assist level: Total Assistance -  Patient < 25%     Locomotion Ambulation   Ambulation assist   Ambulation activity did not occur: Safety/medical concerns          Walk 10 feet activity   Assist  Walk 10 feet activity did not occur: Safety/medical concerns        Walk 50 feet activity   Assist Walk 50 feet with 2 turns activity did not occur: Safety/medical concerns         Walk 150 feet activity   Assist Walk 150 feet activity did not occur: Safety/medical concerns         Walk 10 feet on  uneven surface  activity   Assist Walk 10 feet on uneven surfaces activity did not occur: Safety/medical concerns         Wheelchair     Assist Is the patient using a wheelchair?: Yes Type of Wheelchair: Manual    Wheelchair assist level: Maximal Assistance - Patient 25 - 49% Max wheelchair distance: 77ft    Wheelchair 50 feet with 2 turns activity    Assist        Assist Level: Total Assistance - Patient < 25%   Wheelchair 150 feet activity     Assist      Assist Level: Total Assistance - Patient < 25%   Blood pressure (!) 181/63, pulse 79, temperature 98 F (36.7 C), resp. rate 16, height 5\' 2"  (1.575 m), weight 65.1 kg, SpO2 99 %.  Medical Problem List and Plan: 1. 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  Antithrombotics: -DVT/anticoagulation:  Pharmaceutical: Lovenox             -antiplatelet therapy: Aspirin 81 mg daily and Plavix 75 mg day x 3 weeks then aspirin alone 3. Pain Management: Oxycodone as needed 4. Mood/Behavior/Sleep: Zoloft 25 mg daily, trazodone as needed             -antipsychotic agents: N/A 5. Neuropsych/cognition: This patient is? capable of making decisions on her own  behalf. 6. Skin/Wound Care: Routine skin checks 7. Fluids/Electrolytes/Nutrition: Routine in and outs with follow-up chemistries 8.  Dysphagia.  Dysphagia #3 nectar thick liquids.  Follow-up speech therapy 9.  Hypertension.  Hydralazine 50 mg every 8 hours.  Losartan recently held due to AKI. Daughter notes BP been the best controlled in a long time. Decrease hydralazine to 25mg  TID 10.  Acute urinary retention.  d/c flomax 11.  AKI on CKD stage III.  Baseline creatinine 1.9-2.0.  Latest creatinine 1.71.  Losartan held.  Follow-up chemistries. Start IVF HS.  12.  Diabetes mellitus.  Hemoglobin A1c 7.8.  SSI.  Previously patient had declined initiation of any diabetic medications per documentation.  Will follow-up with family. Placed order for no juice.  13.  History of right ankle fracture.  Patient does wear an ankle stabilizing brace due to previous ankle fx. . 14.  Hyperlipidemia.  Zetia/fenofibrate. 15.  Constipation.  MiraLAX twice daily, Senokot 2 tabs at bedtime as needed.  Hold for loose stools. 16. 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 Kpad ordered 17. Suboptimal vitamin D: start ergocalciferol 50,000U once per week for 7 weeks, discussed that this can help with fatigue      LOS: 2 days A FACE TO FACE EVALUATION WAS PERFORMED  Laquon Emel P Kyri Shader 08/15/2022, 11:09 AM

## 2022-08-16 LAB — BASIC METABOLIC PANEL
Anion gap: 10 (ref 5–15)
BUN: 19 mg/dL (ref 8–23)
CO2: 23 mmol/L (ref 22–32)
Calcium: 8.7 mg/dL — ABNORMAL LOW (ref 8.9–10.3)
Chloride: 105 mmol/L (ref 98–111)
Creatinine, Ser: 1.79 mg/dL — ABNORMAL HIGH (ref 0.44–1.00)
GFR, Estimated: 29 mL/min — ABNORMAL LOW (ref >60)
Glucose, Bld: 119 mg/dL — ABNORMAL HIGH (ref 70–99)
Potassium: 3.7 mmol/L (ref 3.5–5.1)
Sodium: 138 mmol/L (ref 135–145)

## 2022-08-16 LAB — GLUCOSE, CAPILLARY
Glucose-Capillary: 173 mg/dL — ABNORMAL HIGH (ref 70–99)
Glucose-Capillary: 144 mg/dL — ABNORMAL HIGH (ref 70–99)
Glucose-Capillary: 158 mg/dL — ABNORMAL HIGH (ref 70–99)

## 2022-08-16 LAB — MAGNESIUM: Magnesium: 2.1 mg/dL (ref 1.7–2.4)

## 2022-08-16 MED ORDER — LACTATED RINGERS IV SOLN: INTRAVENOUS | Status: DC

## 2022-08-16 MED ORDER — MEDIHONEY WOUND/BURN DRESSING EX PSTE
1.0000 | PASTE | Freq: Every day | CUTANEOUS | Status: DC
  Administered 2022-08-16 – 2022-09-04 (×17): 1 via TOPICAL
  Filled 2022-08-16 (×2): qty 44

## 2022-08-16 MED ORDER — POTASSIUM CHLORIDE 20 MEQ PO PACK
40.0000 meq | PACK | Freq: Once | ORAL | Status: DC
  Filled 2022-08-16: qty 2

## 2022-08-16 MED ORDER — SODIUM CHLORIDE 0.9 % IV SOLN: Freq: Once | INTRAVENOUS | Status: AC

## 2022-08-16 NOTE — Progress Notes (Signed)
Occupational Therapy Session Note  Patient Details  Name: [name] MRN: [id number] Date of Birth: Jul 26, 1945  Today's Date: 08/16/2022 OT Individual Time: 1105-1200 OT Individual Time Calculation (min): 55 min    Short Term Goals: Week 1:  OT Short Term Goal 1 (Week 1): Pt will transfer to [facility] with max A +1 consistently OT Short Term Goal 2 (Week 1): Pt will don shirt with min A OT Short Term Goal 3 (Week 1): Pt will sit unsupported during functional task with supervision OT Short Term Goal 4 (Week 1): Pt will perform sit to stand with max A consistently for clothing management with LRAD  Skilled Therapeutic Interventions/Progress Updates:     Pt received in TIS with no pain. Daughter present beginning of session.  Therapeutic exercise PROM provided to LUE joints in min-mod ranges of motion for prolonged stretch to manage tone, spasticity and decrease risk of contracture against flexor synergy in supported sitting position position. Pt tolerated well.  Therapeutic activity Pt completes squat pivot transfers with facilitation of weight shift forward TIS<>EOM with improved initiation moving back to mat  Sitting balance/core work reaching for objects crossing midline for WB through other extremity in MOD ranges outside BOS  Sit to stand in stedy with MAX A +1 waiting for pt to initiate and push through Les needing multiple trials to stand. Pt remain flexed in the trunk with poor hip extension. Only able ot stand ~20 seconds before fatiguing.  Seated work on functional reach with min facilitation of far ranges for LUE with 5 second reaction time at BITS v 2.3 sec with RUE  Pt left at end of session in TIS with exit alarm on, call light in reach and all needs met   Therapy Documentation Precautions:  Precautions Precautions: Fall Precaution Comments: premorbid L hemi Required Braces or Orthoses: Other Brace Other Brace: R ankle stabilizing brace from prior ankle  fx Restrictions Weight Bearing Restrictions: No RLE Weight Bearing: Weight bearing as tolerated LLE Weight Bearing: Non weight bearing Other Position/Activity Restrictions: Old L sided weakness from a prior CVA from ~3 years ago. History of R abkle fracture ~1-1.5 yr ago General:    Therapy/Group: Individual Therapy  Tonny Branch 08/16/2022, 6:55 AM

## 2022-08-16 NOTE — Plan of Care (Signed)
  Problem: RH Memory Goal: LTG Patient will demonstrate ability for day to day (SLP) Description: LTG:   Patient will demonstrate ability for day to day recall/carryover during cognitive/linguistic activities with assist  (SLP) Flowsheets (Taken 08/16/2022 1542) LTG: Patient will demonstrate ability for day to day recall: New information LTG: Patient will demonstrate ability for day to day recall/carryover during cognitive/linguistic activities with assist (SLP): Minimal Assistance - Patient > 75% Goal: LTG Patient will use memory compensatory aids to (SLP) Description: LTG:  Patient will use memory compensatory aids to recall biographical/new, daily complex information with cues (SLP) Flowsheets (Taken 08/16/2022 1542) LTG: Patient will use memory compensatory aids to (SLP): Minimal Assistance - Patient > 75%   Problem: RH Awareness Goal: LTG: Patient will demonstrate awareness during functional activites type of (SLP) Description: LTG: Patient will demonstrate awareness during functional activites type of (SLP) 08/16/2022 1543 by Helaine Chess A, CCC-SLP Flowsheets (Taken 08/16/2022 1543) LTG: Patient will demonstrate awareness during cognitive/linguistic activities with assistance of (SLP): Moderate Assistance - Patient 50 - 74% 08/16/2022 1542 by Helaine Chess A, CCC-SLP Flowsheets (Taken 08/16/2022 1542) Patient will demonstrate during cognitive/linguistic activities awareness type of: Emergent LTG: Patient will demonstrate awareness during cognitive/linguistic activities with assistance of (SLP): Moderate Assistance - Patient 50 - 74%

## 2022-08-16 NOTE — Progress Notes (Signed)
Physical Therapy Session Note  Patient Details  Name: [name] MRN: [id number] Date of Birth: [birth year]  Today's Date: 08/16/2022 PT Individual Time: 0915-1015 PT Individual Time Calculation (min): 60 min   Short Term Goals: Week 1:  PT Short Term Goal 1 (Week 1): Pt will complete bed mobility with modA PT Short Term Goal 2 (Week 1): Pt will complete bed<>chair transfers with modA and LRAD PT Short Term Goal 3 (Week 1): Pt will ambulate 28f with maxA and LRAD  Skilled Therapeutic Interventions/Progress Updates:      Pt sitting in TIS w/c to start - her daughter at the bedside. Updated daughter on pt's tolerance to therapy last session. Pt denies pain and in agreement to therapy session.  Transported to main rehab gym and setup outside // bars facing large mirror. Worked on repeated sit<>stands (with rest breaks b/w efforts) using pillow to block knees and sheet under hips to help in lifting. Encouraged patient to pull from // bar to help stand. She needed maxA overall to stand and mod/maxA for supporting her in standing. Encouraged postural awareness as she tends to keep trunk and head flexed. Completed several repetitions of this while standing for ~30-45 seconds per stand.  Assisted onto mat table with maxA squat<>pivot transfer, towards her R side. Discussed options to help with transfers - I.e. sliding board. Patient reports they have tried it in the past but didn't like it. Completed x2 SB transfers with maxA overall - patient lacking ability to lift and shift hips across board - she often relies on help prior to initiating task.   Worked on core control, sitting balance, and sitting posture at edge of mat with mirror for visual feedback. Modified crunches, reaching outside BOS, and resistive push/pulls to challenge core and stabilty. Used sheet method to complete totalA squat>pivot transfer back to her TIS w/c.   Returned to her room and concluded session in w/c - daughter  updated. All needs met.   Therapy Documentation Precautions:  Precautions Precautions: Fall Precaution Comments: premorbid L hemi Required Braces or Orthoses: Other Brace Other Brace: R ankle stabilizing brace from prior ankle fx Restrictions Weight Bearing Restrictions: Yes RLE Weight Bearing: Weight bearing as tolerated LLE Weight Bearing: Non weight bearing Other Position/Activity Restrictions: Old L sided weakness from a prior CVA from ~3 years ago. History of R abkle fracture ~1-1.5 yr ago General:     Therapy/Group: Individual Therapy  Sy Saintjean P Maurisa Tesmer PT 08/16/2022, 7:41 AM

## 2022-08-16 NOTE — IPOC Note (Signed)
Overall Plan of Care [facility]) Patient Details Name: [name] MRN: [id number] DOB: 04/16/1946  Admitting Diagnosis: Lateral medullary syndrome  Hospital Problems: Principal Problem:   Lateral medullary syndrome Active Problems:   Left hemiparesis (HCC)   Spasticity as late effect of cerebrovascular accident (CVA)     Functional Problem List: Nursing Bladder, Bowel, Endurance, Medication Management, Safety, Nutrition  PT Balance, Endurance, Motor, Perception, Skin Integrity, Safety  OT Balance, Sensory, Behavior, Skin Integrity, Cognition, Edema, Endurance, Motor, Pain, Perception, Safety  SLP Motor, Nutrition, Safety  TR         Basic ADL's: OT Grooming, Eating, Bathing, Dressing, Toileting     Advanced  ADL's: OT       Transfers: PT Bed to Chair, Car, Bed Mobility  OT Toilet, Tub/Shower     Locomotion: PT Ambulation, Wheelchair Mobility     Additional Impairments: OT Fuctional Use of Upper Extremity  SLP Swallowing, Communication expression    TR      Anticipated Outcomes Item Anticipated Outcome  Self Feeding supervision  Swallowing  Min A   Basic self-care  mod  A  Toileting  mod I   Bathroom Transfers mod A  Bowel/Bladder  manage bowel w mod I and bladder w toileting  Transfers  modA with LRAD  Locomotion  anticipate primarily wheelchair level  Communication  Min A  Cognition     Pain  n/a  Safety/Judgment  manage w cues   Therapy Plan: PT Intensity: Minimum of 1-2 x/day ,45 to 90 minutes PT Frequency: 5 out of 7 days PT Duration Estimated Length of Stay: 10-14 days OT Intensity: Minimum of 1-2 x/day, 45 to 90 minutes OT Frequency: 5 out of 7 days OT Duration/Estimated Length of Stay: ~ 2.5 weeks SLP Intensity: Minumum of 1-2 x/day, 30 to 90 minutes SLP Frequency: 3 to 5 out of 7 days SLP Duration/Estimated Length of Stay: 12-14 days   Team Interventions: Nursing Interventions Patient/Family Education, Dysphagia/Aspiration  Precaution Training, Bladder Management, Medication Management, Discharge Planning, Bowel Management, Disease Management/Prevention  PT interventions Ambulation/gait training, Balance/vestibular training, Cognitive remediation/compensation, Community reintegration, Discharge planning, Disease management/prevention, Functional electrical stimulation, DME/adaptive equipment instruction, Functional mobility training, Neuromuscular re-education, Pain management, Patient/family education, Psychosocial support, Skin care/wound management, Splinting/orthotics, Stair training, Therapeutic Activities, Therapeutic Exercise, UE/LE Strength taining/ROM, UE/LE Coordination activities, Visual/perceptual remediation/compensation, Wheelchair propulsion/positioning  OT Interventions Training and development officer, Disease mangement/prevention, Neuromuscular re-education, Self Care/advanced ADL retraining, Therapeutic Exercise, Cognitive remediation/compensation, DME/adaptive equipment instruction, Wheelchair propulsion/positioning, Pain management, Skin care/wound managment, UE/LE Strength taining/ROM, Community reintegration, Barrister's clerk education, Splinting/orthotics, UE/LE Coordination activities, Discharge planning, Functional mobility training, Psychosocial support, Therapeutic Activities, Visual/perceptual remediation/compensation  SLP Interventions Cognitive remediation/compensation, Dysphagia/aspiration precaution training, Other (comment), Patient/family education (speech intelligibility strategies)  TR Interventions    SW/CM Interventions Discharge Planning, Psychosocial Support, Patient/Family Education   Barriers to Discharge MD  Medical stability  Nursing Decreased caregiver support 1 level, ramped entry w dtr; OPPT 2x/week aquatics. Was able to ambulate ~>130f with rest breaks using platform walker per pt.  PT Insurance for SNF coverage    OT      SLP      SW Insurance for SNF coverage     Team  Discharge Planning: Destination: PT-Home ,OT- Home , SLP-Home Projected Follow-up: PT-Home health PT, 24 hour supervision/assistance, OT-  24 hour supervision/assistance, Outpatient OT, SLP-Home Health SLP, 24 hour supervision/assistance Projected Equipment Needs: PT-To be determined, OT- To be determined, SLP-Other (comment) (suction kit) Equipment Details: PT- , OT-  Patient/family involved in  discharge planning: PT- Patient,  OT-Patient, Family member/caregiver, SLP-Family member/caregiver, Patient  MD ELOS: 10-14 days Medical Rehab Prognosis:  Excellent Assessment: The patient has been admitted for CIR therapies with the diagnosis of lateral medullary syndrome. The team will be addressing functional mobility, strength, stamina, balance, safety, adaptive techniques and equipment, self-care, bowel and bladder mgt, patient and caregiver education. Goals have been set at [place]. Anticipated discharge destination is home.        See Team Conference Notes for weekly updates to the plan of care

## 2022-08-16 NOTE — Progress Notes (Addendum)
Speech Language Pathology Daily Session Note  Patient Details  Name: [name] MRN: [id number] Date of Birth: 14-Aug-1945  Today's Date: 08/16/2022 SLP Individual Time: 0730-0830  Total Time: 60 minutes    Short Term Goals: Week 1: SLP Short Term Goal 1 (Week 1): Pt will participate in therapeutic trials of thin liquid via tsp with no s/sx concerning for airway intrusion to assess readiness for repeat swallow study. SLP Short Term Goal 2 (Week 1): Pt will demonstrate tolerance of current diet textures with minimal s/sx concerning for airway intrusion with Mod A. SLP Short Term Goal 3 (Week 1): Pt will recall and utilize speech intelligibility strategies to improve intelligibilty at the phrase and sentence level to 90%. SLP Short Term Goal 4 (Week 1): Pt will complete formal cognitive-linguistic evaluation to further determine ST POC with 100% completion. SLP Short Term Goal 4 - Progress (Week 1): Met SLP Short Term Goal 5 (Week 1): Pt will verbally recall speech intelligibilty and safe swallowing strategies given Mod A with 50% accuracy.  Skilled Therapeutic Interventions: Pt seen this date for skilled ST intervention targeting swallowing goals outlined above. Additionally engaged pt in informal cognitive assessment. Pt received awake/alert and side lying to L in bed. Agreeable to get dressed and OOB from AM meal. With NT assistance, pt's brief changed, clothes donned, and transferred from bed <> TIS via Stedy + 2 with shoes and R ankle brace donned. No difficulty with transfer noted. Agreeable to intervention in hospital room. Daughter arrived half way through today's session. Large emesis episode noted last evening, with pt reporting that she feels much better this AM.  Today's session with emphasis on diet tolerance monitoring and informal assessment of cognitive skills. Further cognitive-linguistic evaluation completed and revealed diminished short-term recall, decreased safety and  emergent awareness, as well as impaired selective attention and executive functioning skills. Benefited from mass practice, environmental modifications to improve attention to task, and consistent verbal cues to recall aspiration precautions during meal. Minimal awareness of deficits noted with pt requiring education re: changes in swallow function since this admission. Please see above for newly added cognitive goals.  With Mod-Max A verbal and tactile cues for adherence to aspiration precautions, pt self-fed current diet textures (Dysphagia 2 and nectar-thick liquids) with only two instances of immediate cough when pt consumed large bolus and attempted to mix too many textures together. With single + small bites/sips and no mixed consistencies, pt did not exhibit any s/sx concerning for airway intrusion. Given ongoing oral deficits, benefited from intermittent oral suctioning via Y[place]auer to reduce oral residuals along R lateral sulci and within R buccal cavity; pt unable to complete lingual sweep at this time  due to lingual weakness and decreased awareness + attention, though liquid rinse does appear at least partially effective in assisting with oral clearance. Ate 100% of her AM meal within 45 minutes.  Provided ongoing education to pt and pt's daughter re: current diet recommendations, need for thickening liquids with meals and outside of ST sessions, ST POC, and therapeutic cueing techniques to slow pt's intake rate; pt's daughter verbalized understanding, though will need reinforcement, as well as pt. Continue with current diet given full supervision due to impulsivity and decreased awareness/insight into deficits. Will plan to continue trials of thin water via tsp with SLP only at this time; plan reviewed with pt's daughter who verbalized understanding. Pt and pt's daughter appreciative of care.  Pt left in room and OOB in TIS with all safety measures  activated, call bell within reach, and all  immediate needs met. Daughter remained at bedside. Continue per current ST POC.   Pain No reports of pain; NAD  Therapy/Group: Individual Therapy  Aviya Jarvie A Raelyn Racette 08/16/2022, 3:40 PM

## 2022-08-16 NOTE — Progress Notes (Signed)
PROGRESS NOTE   Subjective/Complaints: No new complaints this morning Had negative response to sorbitol yesterday- still no BM Now voiding much better  ROS: +lethargy as per daughter, cognition intact as per team, denies pain   Objective:   CT HEAD WO CONTRAST (5MM)  Result Date: 08/14/2022 CLINICAL DATA:  Initial evaluation for neuro deficit, stroke suspected. EXAM: CT HEAD WITHOUT CONTRAST TECHNIQUE: Contiguous axial images were obtained from the base of the skull through the vertex without intravenous contrast. RADIATION DOSE REDUCTION: This exam was performed according to the departmental dose-optimization program which includes automated exposure control, adjustment of the mA and/or kV according to patient size and/or use of iterative reconstruction technique. COMPARISON:  Prior MRI from 08/07/2022. FINDINGS: Brain: Age-related cerebral atrophy with chronic small vessel ischemic disease. Chronic posterior right MCA distribution infarct normal the right parietal lobe. Scattered remote lacunar infarcts present about the right basal ganglia. Multiple chronic cerebellar infarcts, right greater than left. Previously identified left medullary infarct not visible by CT. No other acute large vessel territory infarct. No acute intracranial hemorrhage. No mass lesion or midline shift. Ventricular prominence related global parenchymal volume loss of hydrocephalus. No extra-axial fluid collection. Vascular: No abnormal hyperdense vessel. Calcified atherosclerosis present at the skull base. Skull: Scalp soft tissues and calvarium within normal limits. Sinuses/Orbits: Globes orbital soft tissues within normal limits. Paranasal sinuses are largely clear. No mastoid effusion. Other: None. IMPRESSION: 1. No acute intracranial abnormality. Recently identified small left medullary infarct not visible by CT. 2. Age-related cerebral atrophy with extensive  chronic ischemic changes as above, stable. Electronically Signed   By: Jeannine Boga M.D.   On: 08/14/2022 19:07   Recent Labs    08/13/22 1446 08/14/22 0730  WBC 8.6 7.0  HGB 14.2 13.0  HCT 43.2 39.5  PLT 278 266   Recent Labs    08/13/22 1446 08/14/22 0730  NA  --  139  K  --  3.4*  CL  --  110  CO2  --  17*  GLUCOSE  --  193*  BUN  --  24*  CREATININE 1.75* 2.07*  CALCIUM  --  8.4*    Intake/Output Summary (Last 24 hours) at 08/16/2022 1100 Last data filed at 08/16/2022 0604 Gross per 24 hour  Intake 757.77 ml  Output --  Net 757.77 ml        Physical Exam: Vital Signs Blood pressure (!) 196/93, pulse 79, temperature 98.2 F (36.8 C), resp. rate 18, height 5' 2"$  (1.575 m), weight 65.1 kg, SpO2 98 %. Constitutional:      Comments: Frail appearing; awake, somewhat alert, but very little speech; Sitting up in bed; daughter at bedside, NAD, BMI 26.69 HENT:     Head: Normocephalic and atraumatic.     Comments: L facial droop- from prior stroke Tongue midline    Right Ear: External ear normal.     Left Ear: External ear normal.     Nose: Nose normal. No congestion.     Mouth/Throat:     Mouth: Mucous membranes are dry.     Pharynx: Oropharynx is clear. No oropharyngeal exudate.  Eyes:     General:  Right eye: No discharge.        Left eye: No discharge.     Extraocular Movements: Extraocular movements intact.     Comments: Nystagmus to L>R  Cardiovascular:     Rate and Rhythm: Normal rate and regular rhythm.     Heart sounds: Normal heart sounds. No murmur heard.    No gallop.  Pulmonary:     Effort: No respiratory distress.     Breath sounds: Normal breath sounds. No wheezing, rhonchi or rales.  Abdominal:     General: Bowel sounds are normal. There is no distension.     Palpations: Abdomen is soft.     Tenderness: There is no abdominal tenderness.  Genitourinary:    Comments: Foley in place; medium amber urine Musculoskeletal:     Cervical  back: Neck supple. No tenderness.     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  Speech is mildly dysarthric but intelligible.  Follows simple commands.  Provides name and age. Finger to nose on R side ok, but cannot test L Denies dizziness  Tolerating standing Psychiatric:     Comments: Flat, decreased interaction  GU: voiding on own!  Assessment/Plan: 1. Functional deficits which require 3+ hours per day of interdisciplinary therapy in a comprehensive inpatient rehab setting. Physiatrist is providing close team supervision and 24 hour management of active medical problems listed below. Physiatrist and rehab team continue to assess barriers to discharge/monitor patient progress toward functional and medical goals  Care Tool:  Bathing    Body parts bathed by patient: Left arm, Chest, Abdomen, Right upper leg, Left upper leg, Face   Body parts bathed by helper: Right arm, Front perineal area, Buttocks, Right lower leg, Left lower leg     Bathing assist Assist Level: Maximal Assistance - Patient 24 - 49%     Upper Body Dressing/Undressing Upper body dressing   What is the patient wearing?: Pull over shirt    Upper body assist Assist Level: Moderate Assistance - Patient 50 - 74%    Lower Body Dressing/Undressing Lower body dressing      What is the patient wearing?: Underwear/pull up, Pants     Lower body assist Assist for lower body dressing: Total Assistance - Patient < 25%     Toileting Toileting    Toileting assist Assist for toileting: 2 Helpers     Transfers Chair/bed transfer  Transfers assist     Chair/bed transfer assist level: Total Assistance - Patient < 25%      Locomotion Ambulation   Ambulation assist   Ambulation activity did not occur: Safety/medical concerns          Walk 10 feet activity   Assist  Walk 10 feet activity did not occur: Safety/medical concerns        Walk 50 feet activity   Assist Walk 50 feet with 2 turns activity did not occur: Safety/medical concerns         Walk 150 feet activity   Assist Walk 150 feet activity did not occur: Safety/medical concerns         Walk 10 feet on uneven surface  activity   Assist Walk 10 feet on  uneven surfaces activity did not occur: Safety/medical concerns         Wheelchair     Assist Is the patient using a wheelchair?: Yes Type of Wheelchair: Manual    Wheelchair assist level: Maximal Assistance - Patient 25 - 49% Max wheelchair distance: 27f    Wheelchair 50 feet with 2 turns activity    Assist        Assist Level: Total Assistance - Patient < 25%   Wheelchair 150 feet activity     Assist      Assist Level: Total Assistance - Patient < 25%   Blood pressure (!) 196/93, pulse 79, temperature 98.2 F (36.8 C), resp. rate 18, height 5' 2"$  (1.575 m), weight 65.1 kg, SpO2 98 %.  Medical Problem List and Plan: 1. 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  Antithrombotics: -DVT/anticoagulation:  Pharmaceutical: Lovenox             -antiplatelet therapy: Aspirin 81 mg daily and Plavix 75 mg day x 3 weeks then aspirin alone 3. Pain Management: Oxycodone as needed 4. Mood/Behavior/Sleep: Zoloft 25 mg daily, trazodone as needed             -antipsychotic agents: N/A 5. Neuropsych/cognition: This patient is? capable of making decisions on her  own behalf. 6. Skin/Wound Care: Routine skin checks 7. Fluids/Electrolytes/Nutrition: Routine in and outs with follow-up chemistries 8.  Dysphagia.  Dysphagia #3 nectar thick liquids.  Follow-up speech therapy 9.  Hypertension.  Hydralazine 25 mg every 8 hours.  Losartan recently held due to AKI. Daughter prefers slow reduction of BP- at baseline she runs [id number] 10.  Acute urinary retention.  d/c flomax 11.  AKI on CKD stage III.  Baseline creatinine 1.9-2.0.  Latest creatinine 1.71.  Losartan held.  Repeat creatinine today. Start IVF HS.  12.  Diabetes mellitus.  Hemoglobin A1c 7.8.  SSI.  Previously patient had declined initiation of any diabetic medications per documentation.  Will follow-up with family. Placed order for no juice.  13.  History of right ankle fracture.  Patient does wear an ankle stabilizing brace due to previous ankle fx. . 14.  Hyperlipidemia.  Zetia/fenofibrate. 15.  Constipation.  Miralax d/ced as per daughter's preference. Senokot 2 tabs at bedtime as needed.  Sorbitol caused side effects.  16. 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 Kpad ordered 17. Suboptimal vitamin D: continue ergocalciferol 50,000U once per week for 7 weeks, discussed that this can help with fatigue      LOS: 3 days A FACE TO FACE EVALUATION WAS PERFORMED  Kiam Bransfield P Ellarose Brandi 08/16/2022, 11:00 AM

## 2022-08-17 DIAGNOSIS — N179 Acute kidney failure, unspecified: Secondary | ICD-10-CM

## 2022-08-17 DIAGNOSIS — K5900 Constipation, unspecified: Secondary | ICD-10-CM

## 2022-08-17 DIAGNOSIS — R1310 Dysphagia, unspecified: Secondary | ICD-10-CM

## 2022-08-17 DIAGNOSIS — I10 Essential (primary) hypertension: Secondary | ICD-10-CM

## 2022-08-17 LAB — GLUCOSE, CAPILLARY
Glucose-Capillary: 123 mg/dL — ABNORMAL HIGH (ref 70–99)
Glucose-Capillary: 190 mg/dL — ABNORMAL HIGH (ref 70–99)
Glucose-Capillary: 219 mg/dL — ABNORMAL HIGH (ref 70–99)
Glucose-Capillary: 202 mg/dL — ABNORMAL HIGH (ref 70–99)

## 2022-08-17 MED ORDER — GERHARDT'S BUTT CREAM
TOPICAL_CREAM | Freq: Every day | CUTANEOUS | Status: DC
  Filled 2022-08-17 (×2): qty 1

## 2022-08-17 MED ORDER — HYDRALAZINE HCL 25 MG PO TABS
25.0000 mg | ORAL_TABLET | Freq: Three times a day (TID) | ORAL | Status: DC | PRN
  Administered 2022-08-21: 25 mg via ORAL
  Filled 2022-08-17: qty 1

## 2022-08-17 MED ORDER — BISACODYL 10 MG RE SUPP
10.0000 mg | Freq: Every day | RECTAL | Status: DC | PRN
  Administered 2022-08-17: 10 mg via RECTAL
  Filled 2022-08-17: qty 1

## 2022-08-17 MED ORDER — LOSARTAN POTASSIUM 50 MG PO TABS
25.0000 mg | ORAL_TABLET | Freq: Every day | ORAL | Status: DC
  Administered 2022-08-19 – 2022-08-20 (×2): 25 mg via ORAL
  Filled 2022-08-17 (×4): qty 1

## 2022-08-17 MED ORDER — ONDANSETRON 4 MG PO TBDP: 4.0000 mg | ORAL_TABLET | Freq: Three times a day (TID) | ORAL | Status: DC | PRN

## 2022-08-17 NOTE — Progress Notes (Signed)
Occupational Therapy Session Note  Patient Details  Name: [name] MRN: [id number] Date of Birth: 08/13/1945  {CHL IP REHAB OT TIME CALCULATIONS:[id number]}   Short Term Goals: {OT [id number]  Skilled Therapeutic Interventions/Progress Updates:     Pt received in *** with *** out of 10 pain in ***. *** provided for pain relief  ADL: Pt completes ADL at overall *** Level. Skilled interventions include: Incontinent BM 6 st to stand in stedy +2 final for pants Neuromuscular Reeducation ****  Therapeutic exercise ***   Therapeutic activity *** paper hearts  Pt left at end of session in *** with exit alarm on, call light in reach and all needs met   Therapy Documentation Precautions:  Precautions Precautions: Fall Precaution Comments: premorbid L hemi Required Braces or Orthoses: Other Brace Other Brace: R ankle stabilizing brace from prior ankle fx Restrictions Weight Bearing Restrictions: Yes RLE Weight Bearing: Weight bearing as tolerated LLE Weight Bearing: Non weight bearing Other Position/Activity Restrictions: Old L sided weakness from a prior CVA from ~3 years ago. History of R abkle fracture ~1-1.5 yr ago  Therapy/Group: {Therapy/Group:[id number]}  Tonny Branch 08/17/2022, 6:18 AM

## 2022-08-17 NOTE — Progress Notes (Addendum)
PROGRESS NOTE   Subjective/Complaints: She refused medications this AM. Reports she had nausea and didn't feel well yesterday. Daughter thinks this may be related to hydralazine.  Reports she did not tolerate multiple BP medications in the past. Daughter does not remember full list of medications she did not tolerate. She does take losartan at home.  Patient feels much better today, nausea resolved. Had several Bms today and yesterday.  LBB 2/10  ROS: +lethargy as per daughter, cognition intact as per team, denies pain, CP, SOB   Objective:   No results found. No results for input(s): "WBC", "HGB", "HCT", "PLT" in the last 72 hours.  Recent Labs    08/16/22 1233  NA 138  K 3.7  CL 105  CO2 23  GLUCOSE 119*  BUN 19  CREATININE 1.79*  CALCIUM 8.7*     Intake/Output Summary (Last 24 hours) at 08/17/2022 1905 Last data filed at 08/17/2022 1745 Gross per 24 hour  Intake 120 ml  Output --  Net 120 ml         Physical Exam: Vital Signs Blood pressure (!) 160/89, pulse 79, temperature (!) 97.1 F (36.2 C), resp. rate 15, height 5' 2"$  (1.575 m), weight 65.1 kg, SpO2 98 %. Constitutional:      Comments: Frail appearing; awake, somewhat alert, but very little speech; Sitting up in bed; daughter at bedside, NAD, BMI 26.69 HENT:     Head: Normocephalic and atraumatic.     Comments: L facial droop- from prior stroke Tongue midline    Right Ear: External ear normal.     Left Ear: External ear normal.     Nose: Nose normal. No congestion.     Mouth/Throat:     Mouth: Mucous membranes are dry.     Pharynx: Oropharynx is clear. No oropharyngeal exudate.  Eyes:     General:        Right eye: No discharge.        Left eye: No discharge.     Extraocular Movements: Extraocular movements intact.     Comments: Nystagmus to L>R  Cardiovascular:     Rate and Rhythm: Normal rate and regular rhythm.     Heart sounds: Normal  heart sounds. No murmur heard.    No gallop.  Pulmonary:     Effort: No respiratory distress.     Breath sounds: Normal breath sounds. No wheezing, rhonchi or rales. Good air movement. Abdominal:     General: Bowel sounds are normal. There is no distension.     Palpations: Abdomen is soft.     Tenderness: There is no abdominal tenderness.  Musculoskeletal:     Cervical back: Neck supple. No tenderness.     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  Speech is mildly dysarthric but intelligible.  Follows simple commands.  Provides name and age. Finger to nose  on R side ok, but cannot test L Denies dizziness  Tolerating standing Psychiatric:     Comments: Flat, decreased interaction  GU: voiding on own!  Assessment/Plan: 1. Functional deficits which require 3+ hours per day of interdisciplinary therapy in a comprehensive inpatient rehab setting. Physiatrist is providing close team supervision and 24 hour management of active medical problems listed below. Physiatrist and rehab team continue to assess barriers to discharge/monitor patient progress toward functional and medical goals  Care Tool:  Bathing    Body parts bathed by patient: Left arm, Chest, Abdomen, Right upper leg, Left upper leg, Face   Body parts bathed by helper: Right arm, Front perineal area, Buttocks, Right lower leg, Left lower leg     Bathing assist Assist Level: Maximal Assistance - Patient 24 - 49%     Upper Body Dressing/Undressing Upper body dressing   What is the patient wearing?: Pull over shirt    Upper body assist Assist Level: Moderate Assistance - Patient 50 - 74%    Lower Body Dressing/Undressing Lower body  dressing      What is the patient wearing?: Underwear/pull up, Pants     Lower body assist Assist for lower body dressing: Total Assistance - Patient < 25%     Toileting Toileting    Toileting assist Assist for toileting: 2 Helpers     Transfers Chair/bed transfer  Transfers assist     Chair/bed transfer assist level: Total Assistance - Patient < 25%     Locomotion Ambulation   Ambulation assist   Ambulation activity did not occur: Safety/medical concerns          Walk 10 feet activity   Assist  Walk 10 feet activity did not occur: Safety/medical concerns        Walk 50 feet activity   Assist Walk 50 feet with 2 turns activity did not occur: Safety/medical concerns         Walk 150 feet activity   Assist Walk 150 feet activity did not occur: Safety/medical concerns         Walk 10 feet on uneven surface  activity   Assist Walk 10 feet on uneven surfaces activity did not occur: Safety/medical concerns         Wheelchair     Assist Is the patient using a wheelchair?: Yes Type of Wheelchair: Manual    Wheelchair assist level: Maximal Assistance - Patient 25 - 49% Max wheelchair distance: 28f    Wheelchair 50 feet with 2 turns activity    Assist        Assist Level: Total Assistance - Patient < 25%   Wheelchair 150 feet activity     Assist      Assist Level: Total Assistance - Patient < 25%   Blood pressure (!) 160/89, pulse 79, temperature (!) 97.1 F (36.2 C), resp. rate 15, height 5' 2"$  (1.575 m), weight 65.1 kg, SpO2 98 %.  Medical Problem List and Plan: 1. 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   Antithrombotics: -DVT/anticoagulation:  Pharmaceutical: Lovenox             -  antiplatelet therapy: Aspirin 81 mg daily and Plavix 75 mg day x 3 weeks then aspirin alone  -2/10 Discussed ASA and plavix for CVA prevention 3. Pain Management: Oxycodone as needed 4. Mood/Behavior/Sleep: Zoloft 25 mg daily, trazodone as needed             -antipsychotic agents: N/A 5. Neuropsych/cognition: This patient is? capable of making decisions on her own behalf. 6. Skin/Wound Care: Routine skin checks  -Protective cream to b/l heels 7. Fluids/Electrolytes/Nutrition: Routine in and outs with follow-up chemistries 8.  Dysphagia.  Dysphagia #3 nectar thick liquids.  Follow-up speech therapy  -2/10 on dys 2 nectar  9.  Hypertension.  Hydralazine 25 mg every 8 hours.  Losartan recently held due to AKI. Daughter prefers slow reduction of BP- at baseline she runs [id number] systolic  [id number] Hydralazine changed to PRN, losartan 26m restarted, daughter reports she had allergy to multiple other BP medications, she will bring a list later time 10.  Acute urinary retention.  d/c flomax 11.  AKI on CKD stage III.  Baseline creatinine 1.9-2.0.  Latest creatinine 1.71.  Losartan held.  Repeat creatinine today. Start IVF HS.   -2/10 Cr appears around baseline restart losartan 284m12.  Diabetes mellitus.  Hemoglobin A1c 7.8.  SSI.  Previously patient had declined initiation of any diabetic medications per documentation.  Will follow-up with family. Placed order for no juice.  13.  History of right ankle fracture.  Patient does wear an ankle stabilizing brace due to previous ankle fx. . 14.  Hyperlipidemia.  Zetia/fenofibrate. 15.  Constipation.  Miralax d/ced as per daughter's preference. Senokot 2 tabs at bedtime as needed.  Sorbitol caused side effects.   -2/10 reports improved after a few BM 16. 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 Kpad ordered 17. Suboptimal vitamin D: continue ergocalciferol 50,000U once per week for 7 weeks, discussed that this can help with fatigue      LOS: 4 days A FACE TO FACE EVALUATION WAS PERFORMED  YuJennye Boroughs/04/2023, 7:05 PM

## 2022-08-17 NOTE — Progress Notes (Signed)
Physical Therapy Session Note  Patient Details  Name: [name] MRN: [id number] Date of Birth: 18-Jun-1946  Today's Date: 08/17/2022 PT Individual Time: 1116-1200 PT Individual Time Calculation (min): 44 min   Short Term Goals: Week 1:  PT Short Term Goal 1 (Week 1): Pt will complete bed mobility with modA PT Short Term Goal 2 (Week 1): Pt will complete bed<>chair transfers with modA and LRAD PT Short Term Goal 3 (Week 1): Pt will ambulate 75f with maxA and LRAD  Skilled Therapeutic Interventions/Progress Updates:     Pt received asleep in tilt-in-space WC with daughter present. Pt easily awakens to verbal and tactile stimuli and is agreeable to therapy. No complaint of pain. WC transport to gym. [place] and cues for hip extension, hand placement, anterior weight shift, and upright trunk and cervical posture. Pt performs several reps of sit to stand in stedy, returning to high perched position to engage core muscles and provide pt with structured object for confidence and stability. Pt able to stand from high perched position with modA/maxA, and remain standing 10-20 seconds prior to requiring rest break.  Pt transfers onto mat with Stedy. Pt positioned in short sitting on edge of mat for balance training as well as continued to work on core and posture muscles. PT provides manual facilitation of trunk and cervical extension due to pt's forward flexed posture at baseline. Pt reports that she enjoys country music, so PT provides music to engage pt and provide rhythmic stimuli, with pt performing lateral and anterior/posterior weight shifting, holding onto PT's hands with bilateral upper extremities. Pt also performs pushing and pulling with bilateral upper extremities.   Stedy transfer back to bed. MaxA for sit to supine with cues for sideleaning onto L elbow and sequencing. Left supine with all needs within reach.   Therapy Documentation Precautions:   Precautions Precautions: Fall Precaution Comments: premorbid L hemi Required Braces or Orthoses: Other Brace Other Brace: R ankle stabilizing brace from prior ankle fx Restrictions Weight Bearing Restrictions: Yes RLE Weight Bearing: Weight bearing as tolerated LLE Weight Bearing: Non weight bearing Other Position/Activity Restrictions: Old L sided weakness from a prior CVA from ~3 years ago. History of R abkle fracture ~1-1.5 yr ago   Therapy/Group: Individual Therapy  WBreck Coons PT, DPT 08/17/2022, 12:14 PM

## 2022-08-17 NOTE — Progress Notes (Signed)
Patient refused all medications this morning. Daughter at bedside. Encouraged patient to take 81 mg asa. Patient declined, states the medications are making me sicker. Dr. Marciano Sequin to visit patient today.

## 2022-08-17 NOTE — Progress Notes (Signed)
Asked patient twice this evening. Daughtr of patient endorses one episode of emesis this afternoon. Per the daughter of patient refused writer to ask the on call provider about nausea medication. Patient refusing at this time as well. Charge Nurse and on call Provider made aware earlier at the start of the shift about patient having an episode of emesis. Per the daughter this is the second time today that her mom has had an episode of emesis. Around 0030 patient having another episode of emesis. Talked to the patient who refused any nausea medication at this time. Talked with the patient and daughter about events with recent episode of vomitus. Per the daughter patient did have an excess of secretions. With these secretions began to have a gag response and vomit. Have witnessed with patient in the last 72 hours this occurring. Daughter of patient attributes scheduled medication reason for the vomiting as she explains after medication was administered this afternoon had episode of emesis. With the first witness of emesis this evening patient did have an excess of secretions prior to vomiting. Patient laying on right side to aide in expelling secretions. In addition to, has an increase in secretions in the nares. Charge Nurse is aware. Firefighter in contacting the on call provider having standby medication for nausea and vomiting. Daughter of patient requesting medication for constipation and on call provider contacted about this. Orders placed.

## 2022-08-17 NOTE — Progress Notes (Signed)
Occupational Therapy Session Note  Patient Details  Name: [name] MRN: [id number] Date of Birth: Jan 30, 1946  Today's Date: 08/17/2022 OT Individual Time: [id number] OT Individual Time Calculation (min): 43 min    Short Term Goals: Week 1:  OT Short Term Goal 1 (Week 1): Pt will transfer to [facility] with max A +1 consistently OT Short Term Goal 2 (Week 1): Pt will don shirt with min A OT Short Term Goal 3 (Week 1): Pt will sit unsupported during functional task with supervision OT Short Term Goal 4 (Week 1): Pt will perform sit to stand with max A consistently for clothing management with LRAD  Skilled Therapeutic Interventions/Progress Updates:  Skilled OT intervention completed with focus on functional transfers, self-feeding. Pt received upright in bed, with daughter present stating that pt had not eaten in almost 24 hours due to episodes of N/V with request to see if pt could eat some of her lunch. No pain verbalized during session.  Encouraged transfer into TIS w/c for functional eating position and to prevent aspiration. Transitioned to EOB with initial max A for trunk elevation however faded to min A with cues for anterior weight shifting and shifting of hips. Once hips aligned and feet in contact with floor, pt was able to statically sit with CGA. Utilized stedy for sit > stand with cues needed for placing both hands on grab bar with pt able to do so without physical assist even on LUE (though pt guard her LUE in flexor pattern at rest). Stood with heavy mod A fading to light mod A with cues for tucking her pelvis for facilitating erect stance. CGA needed for dependent transfer in stedy to TIS w/c. Stood from perched position with CGA, but min cues and poor descent control to w/c.   During self-feeding with pt's dysphagia 2 diet, pt was able to use R hand without cues for plate > mouth transition, however required mod cues throughout for amount of food scooped onto utensil (small vs  large), chewing fully prior to drinking, though pt noted have an audibly strong swallow with min cues. Did use suction and oral sweeps to ensure no aspiration with only 1 episode noted where pt had mild coughing though productive with cues. Daughter mentioned she has been drooling and having difficulty swallowing the past 24 hours with care team already. OT placed fork in LUE for NMR, with pt able to maintain grasp of utensil and when verbally cued, initiated and transitioned hand from plate to mouth. Able to use LUE for about 15% of meal just for AROM, though not her dominant feeding hand.  OT ensured proper fit of resting hand splint that was delivered and reinforced with nursing that pt is to wear this at night for alignment of her L hand/wrist/digits when at rest as pt has not been wearing though delivered into room, per daughter.  Pt remained upright in TIS w/c, daughter supervising rest of pt's meal consumption, with all needs in reach at end of session.   Therapy Documentation Precautions:  Precautions Precautions: Fall Precaution Comments: premorbid L hemi Required Braces or Orthoses: Other Brace Other Brace: R ankle stabilizing brace from prior ankle fx Restrictions Weight Bearing Restrictions: Yes RLE Weight Bearing: Weight bearing as tolerated LLE Weight Bearing: Non weight bearing Other Position/Activity Restrictions: Old L sided weakness from a prior CVA from ~3 years ago. History of R abkle fracture ~1-1.5 yr ago    Therapy/Group: Individual Therapy  Naketa Daddario Percell Locus, MS, OTR/L  08/17/2022, 3:32 PM

## 2022-08-17 NOTE — Progress Notes (Addendum)
Speech Language Pathology Daily Session Note  Patient Details  Name: [name] MRN: [id number] Date of Birth: Jun 18, 1946  Today's Date: 08/17/2022 SLP Individual Time: 0830-0925 SLP Individual Time Calculation (min): 55 min  Short Term Goals: Week 1: SLP Short Term Goal 1 (Week 1): Pt will participate in therapeutic trials of thin liquid via tsp with no s/sx concerning for airway intrusion to assess readiness for repeat swallow study. SLP Short Term Goal 2 (Week 1): Pt will demonstrate tolerance of current diet textures with minimal s/sx concerning for airway intrusion with Mod A. SLP Short Term Goal 3 (Week 1): Pt will recall and utilize speech intelligibility strategies to improve intelligibilty at the phrase and sentence level to 90%. SLP Short Term Goal 4 (Week 1): Pt will complete formal cognitive-linguistic evaluation to further determine ST POC with 100% completion. SLP Short Term Goal 4 - Progress (Week 1): Met SLP Short Term Goal 5 (Week 1): Pt will verbally recall speech intelligibilty and safe swallowing strategies given Mod A with 50% accuracy.  Skilled Therapeutic Interventions: Pt seen this date for skilled ST intervention targeting swallowing and speech intelligibility goals outlined above. Pt received fatigued and lying semi-reclined in bed. Reports that she is tired following emesis episodes the previous evening. Daughter present at onset and end of today's session. Agreeable to intervention at bedside. Oral care provided via suction toothbrush with Total A for thoroughness. Today's session with emphasis on use of implementation of speech intelligibility strategies and diet tolerance monitoring.   With Mod A verbal and model cues, for use of strategies, pt participated in automatic speech task of counting (1-10 and then 11-15) to facilitate mass practice and generalization with ~85% intelligibility given Min to Mod A for recall of speech intelligibility strategies. Limited  by fatigue this date. Provided education and cues to utilize diaphragmatic breathing interventions to assist with vocal intensity and easy onset given strained vocal quality and low intensity. With two-syllable numbers (e.g. "eleven"), pt required Mod-Max A verbal and model prompts to speak at the onset of the exhalation vs at the end of the exhalation; however, was only minimally stimulable. Improved intelligibility and coordination with single syllable number words (e.g. "one"). Given pt's ongoing fatigue, speech intelligibility exercises were terminated, and pt repositioned pt in bed with NT assistance in an attempt to improve alertness and participation.   With repositioning in bed and offering verbal choices, pt chose to consume [employer] (with verbal encouragement from SLP in the setting of poor PO intake) which she self-fed 100% of and stated she enjoyed. Additionally, drank 8 oz of nectar-thick Sprite with only one instance of immediate cough likely due to incoordination and decreased attention in the setting of distraction (SLP conversing with pt's daughter re: ST POC). No further instances concerning for compromised airway protection noted. Benefited from re-education of aspiration precautions prior to self-feeding (pt unable to recall on probe), and implemented with Sup to Min A verbal cues. Observed to consume both textures within an adequate time frame (within 20 minutes). Continue to recommend current diet textures and full supervision. Provided education to pt and pt's daughter re: rationale for only providing nectar-thick liquids at this time, given pt's daughter states she has been providing her mother with thin liquid via tsp. Provided instruction and demonstration re: thickening pt's preferred liquids; pt's daughter verbalized understanding. Pt and pt's daughter appreciative of assistance and education. Will plan to complete therapeutic trials of thin liquid via tsp and medicine cup next session  and reinforce today's education.  Pt left in room and in bed with all safety measures activated and call bell within reach. Daughter remained at bedside. Continue per current ST POC.   Pain No pain reported; NAD  Therapy/Group: Individual Therapy  Unnamed Zeien A Taylon Coole 08/17/2022, 2:39 PM

## 2022-08-18 DIAGNOSIS — R638 Other symptoms and signs concerning food and fluid intake: Secondary | ICD-10-CM

## 2022-08-18 LAB — GLUCOSE, CAPILLARY: Glucose-Capillary: 136 mg/dL — ABNORMAL HIGH (ref 70–99)

## 2022-08-18 MED ORDER — SODIUM CHLORIDE 0.9 % IV SOLN: INTRAVENOUS | Status: AC

## 2022-08-18 MED ORDER — SODIUM CHLORIDE 0.9 % IV SOLN: INTRAVENOUS | Status: DC

## 2022-08-18 NOTE — Progress Notes (Signed)
Patient refused Cozaar this AM. Patient and family does not want B/P medications given if BP is not over 190. Per note in room and in patients chart. Current B/P 184/89 P 88.

## 2022-08-18 NOTE — Progress Notes (Addendum)
PROGRESS NOTE   Subjective/Complaints: Continues to refuse BP medications but took most others. Reports she is feeling well this AM. Slept well last night. Daughter feels she was talking better today/more active than she has since being admitted.  Daughter reports she did not drink much water today.   LBB 2/11  ROS: +lethargy as per daughter-improved today, cognition intact as per team, denies pain, CP, SOB, abd pain   Objective:   No results found. No results for input(s): "WBC", "HGB", "HCT", "PLT" in the last 72 hours.  Recent Labs    08/16/22 1233  NA 138  K 3.7  CL 105  CO2 23  GLUCOSE 119*  BUN 19  CREATININE 1.79*  CALCIUM 8.7*     Intake/Output Summary (Last 24 hours) at 08/18/2022 2111 Last data filed at 08/18/2022 1849 Gross per 24 hour  Intake 220 ml  Output --  Net 220 ml         Physical Exam: Vital Signs Blood pressure (!) 163/55, pulse 83, temperature 98.9 F (37.2 C), temperature source Oral, resp. rate 16, height 5' 2"$  (1.575 m), weight 65.1 kg, SpO2 95 %. Constitutional:      Comments: Frail appearing; awake, somewhat alert, but very little speech; Sitting up in bed; daughter at bedside, NAD, BMI 26.69 HENT:     Head: Normocephalic and atraumatic.     Comments: L facial droop- from prior stroke Tongue midline    Right Ear: External ear normal.     Left Ear: External ear normal.     Nose: Nose normal. No congestion.     Mouth/Throat:     Mouth: Mucous membranes are dry.     Pharynx: Oropharynx is clear. No oropharyngeal exudate.  Eyes:     General:        Right eye: No discharge.        Left eye: No discharge.     Extraocular Movements: Extraocular movements intact.     Comments: Nystagmus to L>R  Cardiovascular:     Rate and Rhythm: Normal rate and regular rhythm.     Heart sounds: Normal heart sounds. No murmur heard.    No gallop.  Pulmonary:     Effort: No respiratory  distress.     Breath sounds: Normal breath sounds. No wheezing, rhonchi or rales. Good air movement. Abdominal:     General:normoactive BS. There is no distension.     Palpations: Abdomen is soft.     Tenderness: There is no abdominal tenderness.  Musculoskeletal:     Cervical back: Neck supple. No tenderness.     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  Speech is mildly dysarthric but intelligible.  Follows simple commands.  Provides name and age. Finger to nose on R side ok, but cannot test L Denies dizziness  Psychiatric:     Comments: Flat, decreased  interaction  GU: voiding on own!  Assessment/Plan: 1. Functional deficits which require 3+ hours per day of interdisciplinary therapy in a comprehensive inpatient rehab setting. Physiatrist is providing close team supervision and 24 hour management of active medical problems listed below. Physiatrist and rehab team continue to assess barriers to discharge/monitor patient progress toward functional and medical goals  Care Tool:  Bathing    Body parts bathed by patient: Left arm, Chest, Abdomen, Right upper leg, Left upper leg, Face   Body parts bathed by helper: Right arm, Front perineal area, Buttocks, Right lower leg, Left lower leg     Bathing assist Assist Level: Maximal Assistance - Patient 24 - 49%     Upper Body Dressing/Undressing Upper body dressing   What is the patient wearing?: Pull over shirt    Upper body assist Assist Level: Moderate Assistance - Patient 50 - 74%    Lower Body Dressing/Undressing Lower body dressing      What is the patient wearing?: Underwear/pull up, Pants     Lower body assist Assist for lower body  dressing: Total Assistance - Patient < 25%     Toileting Toileting    Toileting assist Assist for toileting: 2 Helpers     Transfers Chair/bed transfer  Transfers assist     Chair/bed transfer assist level: Total Assistance - Patient < 25%     Locomotion Ambulation   Ambulation assist   Ambulation activity did not occur: Safety/medical concerns          Walk 10 feet activity   Assist  Walk 10 feet activity did not occur: Safety/medical concerns        Walk 50 feet activity   Assist Walk 50 feet with 2 turns activity did not occur: Safety/medical concerns         Walk 150 feet activity   Assist Walk 150 feet activity did not occur: Safety/medical concerns         Walk 10 feet on uneven surface  activity   Assist Walk 10 feet on uneven surfaces activity did not occur: Safety/medical concerns         Wheelchair     Assist Is the patient using a wheelchair?: Yes Type of Wheelchair: Manual    Wheelchair assist level: Maximal Assistance - Patient 25 - 49% Max wheelchair distance: 55f    Wheelchair 50 feet with 2 turns activity    Assist        Assist Level: Total Assistance - Patient < 25%   Wheelchair 150 feet activity     Assist      Assist Level: Total Assistance - Patient < 25%   Blood pressure (!) 163/55, pulse 83, temperature 98.9 F (37.2 C), temperature source Oral, resp. rate 16, height 5' 2"$  (1.575 m), weight 65.1 kg, SpO2 95 %.  Medical Problem List and Plan: 1. 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  Antithrombotics: -DVT/anticoagulation:  Pharmaceutical: Lovenox             -antiplatelet therapy: Aspirin  81 mg daily and Plavix 75 mg day x 3 weeks then aspirin alone  -  2/10 Discussed ASA and plavix for CVA prevention  2/11 she took ASA and plavix today 3. Pain Management: Oxycodone as needed 4. Mood/Behavior/Sleep: Zoloft 25 mg daily, trazodone as needed             -antipsychotic agents: N/A 5. Neuropsych/cognition: This patient is? capable of making decisions on her own behalf. 6. Skin/Wound Care: Routine skin checks  -Protective cream to b/l heels 7. Fluids/Electrolytes/Nutrition: Routine in and outs with follow-up chemistries 8.  Dysphagia.  Dysphagia #3 nectar thick liquids.  Follow-up speech therapy  -2/10 on dys 2 nectar  9.  Hypertension.  Hydralazine 25 mg every 8 hours.  Losartan recently held due to AKI. Daughter prefers slow reduction of BP- at baseline she runs [id number] systolic  [id number] Hydralazine changed to PRN, losartan 22m restarted, daughter reports she had allergy to multiple other BP medications, she will bring a list later time  2/11 She declined bp meds today, BP stable in 160s 10.  Acute urinary retention.  d/c flomax 11.  AKI on CKD stage III.  Baseline creatinine 1.9-2.0.  Latest creatinine 1.71.  Losartan held.  Repeat creatinine today. Start IVF HS.   -2/10 Cr appears around baseline restart losartan 264m Recheck tomorrow 12.  Diabetes mellitus.  Hemoglobin A1c 7.8.  SSI.  Previously patient had declined initiation of any diabetic medications per documentation.  Will follow-up with family. Placed order for no juice.  13.  History of right ankle fracture.  Patient does wear an ankle stabilizing brace due to previous ankle fx. . 14.  Hyperlipidemia.  Zetia/fenofibrate. 15.  Constipation.  Miralax d/ced as per daughter's preference. Senokot 2 tabs at bedtime as needed.  Sorbitol caused side effects.   -2/10 reports improved after a few BM  2/11 LBM today 16. 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 Kpad ordered 17. Suboptimal vitamin D: continue ergocalciferol 50,000U once per week for 7 weeks, discussed that this can help with fatigue 18. Poor PO fluid intake  -IVF overnight today [id number]      LOS: 5 days A FACE TO FACE EVALUATION WAS PERFORMED  YurJennye Boroughs11/2024, 9:11 PM

## 2022-08-19 ENCOUNTER — Inpatient Hospital Stay (HOSPITAL_COMMUNITY): Payer: Medicare PPO

## 2022-08-19 LAB — CBC
HCT: 39.9 % (ref 36.0–46.0)
Hemoglobin: 12.8 g/dL (ref 12.0–15.0)
MCH: 26.9 pg (ref 26.0–34.0)
MCHC: 32.1 g/dL (ref 30.0–36.0)
MCV: 84 fL (ref 80.0–100.0)
Platelets: 245 10*3/uL (ref 150–400)
RBC: 4.75 MIL/uL (ref 3.87–5.11)
RDW: 15.9 % — ABNORMAL HIGH (ref 11.5–15.5)
WBC: 7.3 10*3/uL (ref 4.0–10.5)
nRBC: 0 % (ref 0.0–0.2)

## 2022-08-19 LAB — GLUCOSE, CAPILLARY
Glucose-Capillary: 169 mg/dL — ABNORMAL HIGH (ref 70–99)
Glucose-Capillary: 157 mg/dL — ABNORMAL HIGH (ref 70–99)

## 2022-08-19 MED ORDER — SODIUM CHLORIDE 0.9 % IV SOLN: Freq: Every day | INTRAVENOUS | Status: AC

## 2022-08-19 NOTE — Progress Notes (Signed)
Physical Therapy Session Note  Patient Details  Name: [name] MRN: [id number] Date of Birth: 08/21/1945  Today's Date: 08/19/2022 PT Individual Time: [id number] + 1445-1515 PT Individual Time Calculation (min): 42 min  + 30 min  Short Term Goals: Week 1:  PT Short Term Goal 1 (Week 1): Pt will complete bed mobility with modA PT Short Term Goal 2 (Week 1): Pt will complete bed<>chair transfers with modA and LRAD PT Short Term Goal 3 (Week 1): Pt will ambulate 42f with maxA and LRAD  Skilled Therapeutic Interventions/Progress Updates:      1st session: Pt sitting in TIS w/c with her daughter helping her with feeding. Patient having multiple coughing spells while eating D2 diet and thickened liquids. SLP notified via secure chat. Daughter reports concern that asprin and Plavix is affecting the patient cognitively - MD present during morning rounding to discuss.   Transported in w/c to main rehab gym for time management. Completed mod/maxA squat<>pivot transfer towards mat table - PT facilitating forward weight shifting and lifting hips during transfer.   Used [employer] [place] to facilitate standing during this session. Patient with limited ability to functionally use her LUE in [place] due to tone in elbow flexors and shoulder internal rotators.   Completed totalA stand in [place] her LUE off of arm trough due to tone - PT positioned posteriorly to facilitate hip and trunk extension in standing. Standing tolerance ~1-2 minutes per stand due to fatigue and knee discomfort from knee block. Provided pillows bilaterally to help comfort he knees during standing. Patient [facility]/sitting. Deferred further trials.   Worked on static sitting balance and postural control/awareness at edge of mat. Requires close SBA/supervision during unsupported sitting. Posterior bias and lean to the R that gradually worsens with fatigue. Difficulty  achieving midline when she loses her balance. Visual cues to assist with sitting balance.   Completed maxA squat<>pivot transfer towards her weaker L side - cues for initiation and effort. Assist needed for repositioning in w/c.   Returned to her room and patient concluded session in w/c with daughter present. All needs met.     2nd session: Pt resting in bed sleeping - daughter at bedside. Daughter reports patient just fell asleep and recently got back to bed <20 minutes ago. Patient reports being agreeable to therapy session, denies pain. Donned shorts with totalA at bed level - assist for rolling and pulling pants over hips.   Supine<>sitting EOB requiring maxA for trunk and BLE management with hospital bed features. Patient requires assist to forward scoot to EOB to achieve feet flat. Focused part of session on static sitting balance and trunk control in sitting. Patient demonstrates persistent trunk lean to the R and posterior bias. Delayed and insufficient righting responses. She resist's much forward and L lateral weight shifting. PT positioned on her L to help facilitate. She tolerated sitting EOB for ~8 minutes.   Returned to supine with totalA for trunk and BLE management. [facility] trend[facility]. Completed supine stretching for BLE - hamstring, heel cord, and adductors. Patient lacks full ankle DF PROM on L and is significantly limited in hip adductors. Stretching 3x30 second each muscle group bilaterally.  Pt concluded session in bed with alarm on and all needs met.      Therapy Documentation Precautions:  Precautions Precautions: Fall Precaution Comments: premorbid L hemi Required Braces or Orthoses: Other Brace Other Brace: R  ankle stabilizing brace from prior ankle fx Restrictions Weight Bearing Restrictions: Yes RLE Weight Bearing: Weight bearing as tolerated LLE Weight Bearing: Non weight bearing Other Position/Activity Restrictions: Old L sided  weakness from a prior CVA from ~3 years ago. History of R abkle fracture ~1-1.5 yr ago General:     Therapy/Group: Individual Therapy  Alger Simons 08/19/2022, 7:38 AM

## 2022-08-19 NOTE — Progress Notes (Signed)
PROGRESS NOTE   Subjective/Complaints: Daughter notes that yesterday morning was one of her best days. She was talking to the aide for 2 hours but appeared to decline again cognitively after receiving Plavix   ROS: +lethargy as per daughter-improved today, cognition intact as per team, denies pain, CP, SOB, abd pain   Objective:   No results found. No results for input(s): "WBC", "HGB", "HCT", "PLT" in the last 72 hours.  Recent Labs    08/16/22 1233  NA 138  K 3.7  CL 105  CO2 23  GLUCOSE 119*  BUN 19  CREATININE 1.79*  CALCIUM 8.7*    Intake/Output Summary (Last 24 hours) at 08/19/2022 1112 Last data filed at 08/19/2022 0725 Gross per 24 hour  Intake 700 ml  Output --  Net 700 ml        Physical Exam: Vital Signs Blood pressure (!) 160/58, pulse 70, temperature 98.2 F (36.8 C), resp. rate 17, height 5' 2"$  (1.575 m), weight 65.1 kg, SpO2 96 %. Constitutional:      Comments: Frail appearing; awake, somewhat alert, but very little speech; Sitting up in bed; daughter at bedside, NAD, BMI 26.69 HENT:     Head: Normocephalic and atraumatic.     Comments: L facial droop- from prior stroke Tongue midline    Right Ear: External ear normal.     Left Ear: External ear normal.     Nose: Nose normal. No congestion.     Mouth/Throat:     Mouth: Mucous membranes are dry.     Pharynx: Oropharynx is clear. No oropharyngeal exudate.  Eyes:     General:        Right eye: No discharge.        Left eye: No discharge.     Extraocular Movements: Extraocular movements intact.     Comments: Nystagmus to L>R  Cardiovascular:     Rate and Rhythm: Normal rate and regular rhythm.     Heart sounds: Normal heart sounds. No murmur heard.    No gallop.  Pulmonary:     Effort: No respiratory distress.     Breath sounds: Normal breath sounds. No wheezing, rhonchi or rales. Good air movement. Abdominal:     General:normoactive  BS. There is no distension.     Palpations: Abdomen is soft.     Tenderness: There is no abdominal tenderness.  Musculoskeletal:     Cervical back: Neck supple. No tenderness.     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  Speech is mildly dysarthric but intelligible.  Follows simple commands.  Provides name and age. Finger to nose on R side ok, but cannot test L Denies dizziness  Psychiatric:     Comments: Flat, decreased interaction  GU: voiding on own! Total A for peri hygiene  Assessment/Plan: 1. Functional deficits which require 3+ hours per day of  interdisciplinary therapy in a comprehensive inpatient rehab setting. Physiatrist is providing close team supervision and 24 hour management of active medical problems listed below. Physiatrist and rehab team continue to assess barriers to discharge/monitor patient progress toward functional and medical goals  Care Tool:  Bathing    Body parts bathed by patient: Left arm, Chest, Abdomen, Right upper leg, Left upper leg, Face   Body parts bathed by helper: Right arm, Front perineal area, Buttocks, Right lower leg, Left lower leg     Bathing assist Assist Level: Maximal Assistance - Patient 24 - 49%     Upper Body Dressing/Undressing Upper body dressing   What is the patient wearing?: Pull over shirt    Upper body assist Assist Level: Moderate Assistance - Patient 50 - 74%    Lower Body Dressing/Undressing Lower body dressing      What is the patient wearing?: Underwear/pull up, Pants     Lower body assist Assist for lower body dressing: Total Assistance - Patient < 25%     Toileting Toileting    Toileting assist Assist for toileting:  2 Helpers     Transfers Chair/bed transfer  Transfers assist     Chair/bed transfer assist level: Total Assistance - Patient < 25%     Locomotion Ambulation   Ambulation assist   Ambulation activity did not occur: Safety/medical concerns          Walk 10 feet activity   Assist  Walk 10 feet activity did not occur: Safety/medical concerns        Walk 50 feet activity   Assist Walk 50 feet with 2 turns activity did not occur: Safety/medical concerns         Walk 150 feet activity   Assist Walk 150 feet activity did not occur: Safety/medical concerns         Walk 10 feet on uneven surface  activity   Assist Walk 10 feet on uneven surfaces activity did not occur: Safety/medical concerns         Wheelchair     Assist Is the patient using a wheelchair?: Yes Type of Wheelchair: Manual    Wheelchair assist level: Maximal Assistance - Patient 25 - 49% Max wheelchair distance: 58f    Wheelchair 50 feet with 2 turns activity    Assist        Assist Level: Total Assistance - Patient < 25%   Wheelchair 150 feet activity     Assist      Assist Level: Total Assistance - Patient < 25%   Blood pressure (!) 160/58, pulse 70, temperature 98.2 F (36.8 C), resp. rate 17, height 5' 2"$  (1.575 m), weight 65.1 kg, SpO2 96 %.  Medical Problem List and Plan: 1. 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  Impaired mobility: daughter notes mom does not tolerate lovenox, d/ced, SCDs ordered.              -antiplatelet therapy: Aspirin 81 mg daily, temporarily d/ced plavix as  per daughter's request as she said mom  was more lethargic after receiving this 3. Pain: N/A. Oxycodone discontinued. 4. Mood/Behavior/Sleep: Zoloft 25 mg daily, trazodone as needed             -antipsychotic agents: N/A 5. Neuropsych/cognition: This patient is? capable of making decisions on her own behalf. 6. Skin/Wound Care: Routine skin checks  -Protective cream to b/l heels 7. Fluids/Electrolytes/Nutrition: Routine in and outs with follow-up chemistries 8.  Dysphagia.  Dysphagia #3 nectar thick liquids.  Follow-up speech therapy  -2/10 on dys 2 nectar  9.  Hypertension.  Hydralazine 25 mg every 8 hours.  Losartan recently held due to AKI. Daughter prefers slow reduction of BP- at baseline she runs [id number] systolic  [id number] Hydralazine changed to PRN, losartan 49m restarted, daughter reports she had allergy to multiple other BP medications, she will bring a list later time  2/11 She declined bp meds today, BP stable in 160s 10.  Acute urinary retention.  d/c flomax 11.  AKI on CKD stage III.  Baseline creatinine 1.9-2.0.  Latest creatinine 1.71.  Losartan held.  Repeat creatinine today. Start IVF HS.   -2/10 Cr appears around baseline restart losartan 273m Recheck tomorrow 12.  Diabetes mellitus.  Hemoglobin A1c 7.8.  SSI.  Previously patient had declined initiation of any diabetic medications per documentation.  Will follow-up with family. Placed order for no juice.  13.  History of right ankle fracture.  Patient does wear an ankle stabilizing brace due to previous ankle fx. . 14.  Hyperlipidemia.  Zetia/fenofibrate. 15.  Constipation.  Miralax d/ced as per daughter's preference. Senokot 2 tabs at bedtime as needed.  Sorbitol caused side effects.   -2/10 reports improved after a few BM  2/11 LBM today 16. 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 Kpad ordered 17. Suboptimal vitamin  D: continue ergocalciferol 50,000U once per week for 7 weeks, discussed that this can help with fatigue 18. Poor PO fluid intake  -IVF overnight today [id number]      LOS: 6 days A FACE TO FACE EVALUATION WAS PERFORMED  Nasiir Monts P [place] 08/19/2022, 11:12 AM

## 2022-08-19 NOTE — Progress Notes (Signed)
Occupational Therapy Session Note  Patient Details  Name: [name] MRN: [id number] Date of Birth: [birth year]  Today's Date: 08/19/2022 OT Individual Time: 0730-0830 OT Individual Time Calculation (min): 60 min    Short Term Goals: Week 1:  OT Short Term Goal 1 (Week 1): Pt will transfer to [facility] with max A +1 consistently OT Short Term Goal 2 (Week 1): Pt will don shirt with min A OT Short Term Goal 3 (Week 1): Pt will sit unsupported during functional task with supervision OT Short Term Goal 4 (Week 1): Pt will perform sit to stand with max A consistently for clothing management with LRAD  Skilled Therapeutic Interventions/Progress Updates:    Pt received supine with no c/o pain, agreeable to OT session. Pt alert and asking about last night's superbowl. She came to EOB with mod cueing and mod A to elevate trunk. She was able to sustain static sitting balance EOB with (S) for several minutes while OT donned R ankle brace and shoes. She progressed to needing min A for balance support. She completed sit > stand in the stedy with only min A from elevated EOB. She was transferred to the TIS w/c via stedy. She completed UB Adls at the sink with min A for thoroughness when bathing- requiring min cueing for LUE use. She donned a shirt with mod A to thread BUE. She had an incontinent BM and required max A to stand in the stedy. Total A for peri hygiene while her daughter provided mod-max A for standing balance support in the stedy as she fatigued. Pants donned with max A overall but was able to participate in threading distally with trunk support. Pt was taken via w/c to the therapy gym for time management. She worked on LUE functional reaching, especially in elbow extension and for gross grasp. She was able to maintain grasp on cylindrical cones with occasional min facilitation. Cueing and encouragement for bimanual integration during task. Worked on standing at the high-low table with heavy max A for  3 trials, including L knee blocking. PROM provided to her LUE, with extra focus on elbow extension and shoulder ER. She returned to her room and was left sitting up in the TIS w/c with her daughter present and supervising. Encouraged daughter to discuss medications for arousal with MD.   Therapy Documentation Precautions:  Precautions Precautions: Fall Precaution Comments: premorbid L hemi Required Braces or Orthoses: Other Brace Other Brace: R ankle stabilizing brace from prior ankle fx Restrictions Weight Bearing Restrictions: Yes RLE Weight Bearing: Weight bearing as tolerated LLE Weight Bearing: Non weight bearing Other Position/Activity Restrictions: Old L sided weakness from a prior CVA from ~3 years ago. History of R abkle fracture ~1-1.5 yr ago    Therapy/Group: Individual Therapy  Curtis Sites 08/19/2022, 6:21 AM

## 2022-08-19 NOTE — Progress Notes (Signed)
Occupational Therapy Session Note  Patient Details  Name: ASHANNA PERSONETTE MRN: [id number] Date of Birth: 05/06/1946  Today's Date: 08/19/2022 OT Individual Time: 1445-1530 OT Individual Time Calculation (min): 45 min    Short Term Goals: Week 1:  OT Short Term Goal 1 (Week 1): Pt will transfer to [facility] with max A +1 consistently OT Short Term Goal 2 (Week 1): Pt will don shirt with min A OT Short Term Goal 3 (Week 1): Pt will sit unsupported during functional task with supervision OT Short Term Goal 4 (Week 1): Pt will perform sit to stand with max A consistently for clothing management with LRAD  Skilled Therapeutic Interventions/Progress Updates:   Pt seen for pm skilled OT session with focus on NMRE. Pt bed level with dtr bedside. OT appreciated notes and dtr's input that pt more R oriented this day and with + R lean. Secure chat planned to team to ensure proper communication. OT noted with supine to EOB as pt supine upon OT arrival. Max support to move to EOB with weight shift and resistance off R side strong wihtout mod-max facilitation. Once seated with hips square and LE's supported OT worked on various midline and L weight shifting activity including scanning, object reaching, pillow crunches and L head turns. Improvement to maintain midline after 5-7 min of NMRE. Sat for hair brushing activity with again, multiple episodes of inability to move to midline from R leans. Once back supine, Ot worked with pt with knees bent for hip/trunk/neck disassociation stretches 10 x x 3 sets R/L. Placed pt on L side at end of sesison to further re-orient to L side. Bed exit et and all needs and nurse call button left in reach.   Therapy Documentation Precautions:  Precautions Precautions: Fall Precaution Comments: premorbid L hemi Required Braces or Orthoses: Other Brace Other Brace: R ankle stabilizing brace from prior ankle fx Restrictions Weight Bearing Restrictions: Yes RLE Weight Bearing:  Weight bearing as tolerated LLE Weight Bearing: Non weight bearing Other Position/Activity Restrictions: Old L sided weakness from a prior CVA from ~3 years ago. History of R abkle fracture ~1-1.5 yr ago    Therapy/Group: Individual Therapy  Barnabas Lister 08/19/2022, 3:51 PM

## 2022-08-20 ENCOUNTER — Inpatient Hospital Stay (HOSPITAL_COMMUNITY): Payer: Medicare PPO

## 2022-08-20 ENCOUNTER — Ambulatory Visit: Payer: Medicare PPO | Admitting: Rehabilitation

## 2022-08-20 DIAGNOSIS — I63012 Cerebral infarction due to thrombosis of left vertebral artery: Secondary | ICD-10-CM

## 2022-08-20 LAB — CREATININE, SERUM
Creatinine, Ser: 1.66 mg/dL — ABNORMAL HIGH (ref 0.44–1.00)
GFR, Estimated: 32 mL/min — ABNORMAL LOW (ref >60)

## 2022-08-20 MED ORDER — SODIUM CHLORIDE 0.9 % IV SOLN: Freq: Once | INTRAVENOUS | Status: AC

## 2022-08-20 MED ORDER — EZETIMIBE 10 MG PO TABS
10.0000 mg | ORAL_TABLET | Freq: Every day | ORAL | Status: DC
  Filled 2022-08-20 (×2): qty 1

## 2022-08-20 MED ORDER — MELATONIN 3 MG PO TABS: 3.0000 mg | ORAL_TABLET | Freq: Every evening | ORAL | Status: DC | PRN

## 2022-08-20 MED ORDER — ASPIRIN 81 MG PO TBEC
81.0000 mg | DELAYED_RELEASE_TABLET | Freq: Every day | ORAL | Status: DC
  Administered 2022-08-21 – 2022-08-22 (×2): 81 mg via ORAL
  Filled 2022-08-20 (×3): qty 1

## 2022-08-20 MED ORDER — LOSARTAN POTASSIUM 50 MG PO TABS: 50.0000 mg | ORAL_TABLET | Freq: Every day | ORAL | Status: DC

## 2022-08-20 MED ORDER — NYSTATIN 100000 UNIT/ML MT SUSP
5.0000 mL | Freq: Four times a day (QID) | OROMUCOSAL | Status: DC
  Administered 2022-08-20 – 2022-08-26 (×15): 500000 [IU] via ORAL
  Filled 2022-08-20 (×19): qty 5

## 2022-08-20 MED ORDER — CLOPIDOGREL BISULFATE 75 MG PO TABS
75.0000 mg | ORAL_TABLET | Freq: Every day | ORAL | Status: DC
  Administered 2022-08-21 – 2022-08-27 (×7): 75 mg via ORAL
  Filled 2022-08-20 (×9): qty 1

## 2022-08-20 NOTE — Consult Note (Signed)
NEUROLOGY CONSULTATION NOTE   Date of service: August 20, 2022 Patient Name: [name] MRN:  [id number] DOB:  30-Jan-1946 Reason for consult: "new stroke on MRI" Requesting Provider: Izora Ribas, MD _ _ _   _ __   _ __ _ _  __ __   _ __   __ _  History of Present Illness  [name] is a 77 y.o. female with PMH significant for DM2, HTN, macular degeneration, CKD 3a, prior stroke in 2019 with residual L sided weakness who presented to the ED on 08/07/22 with worsening lethargy and generalized weakness and poor po intake. At that time, daughter had noted left facial droop for a week, slurred speech, poor po intake and not able to keep food down with intermittent N/V, initially felt to be UTI but then returned again and she had MRI Brain which demonstrated a left lateral medullary infarct.  Patient/family declined vessel imaging and TTE. LDL was elevated to 100 with HbA1c of 7.8. She was discharged to inpatient rehab on DAPT x 3 weeks, followed by Aspirin alone.  At the rehab, she was noted to excessively lean to the right yesterday with fluctuating mentation. She has good and bad days. She had MRI Brain w/o contrast which demonstrated a new acute left anteromedial infarct, medial to the area of acute infarct noted on MRI from 08/07/22.  Review of med history demonstrated several instances of patient/family politely declining 3 doses of plavix and one dose of Aspirin over the last week. Patient/daughter concerned about plavix causing lethargy and thus the hesitation.  LKW: around 07/30/22. mRS: 4 tNKASE: not offered, recent stroke and outside window Thrombectomy: not offered, low suspicion for LVO. NIHSS components Score: Comment  1a Level of Conscious 0[x]$  1[]$  2[]$  3[]$      1b LOC Questions 0[x]$  1[]$  2[]$       1c LOC Commands 0[x]$  1[]$  2[]$       2 Best Gaze 0[x]$  1[]$  2[]$       3 Visual 0[x]$  1[]$  2[]$  3[]$      4 Facial Palsy 0[]$  1[x]$  2[]$  3[]$      5a Motor Arm - left 0[]$  1[x]$  2[]$  3[]$  4[]$   UN[]$    5b Motor Arm - Right 0[x]$  1[]$  2[]$  3[]$  4[]$  UN[]$    6a Motor Leg - Left 0[x]$  1[]$  2[]$  3[]$  4[]$  UN[]$    6b Motor Leg - Right 0[]$  1[x]$  2[]$  3[]$  4[]$  UN[]$    7 Limb Ataxia 0[]$  1[]$  2[x]$  3[]$  UN[]$   LUE and RLE  8 Sensory 0[x]$  1[]$  2[]$  UN[]$      9 Best Language 0[x]$  1[]$  2[]$  3[]$      10 Dysarthria 0[]$  1[x]$  2[]$  UN[]$      11 Extinct. and Inattention 0[x]$  1[]$  2[]$       TOTAL: 6      ROS   Negative except for HPI.  Past History   Past Medical History:  Diagnosis Date   Allergy    Arthritis    Broken ankle    Cataract    Diabetes mellitus without complication ([place])    History of colonoscopy    Hypertension    Macular degeneration syndrome    with edema---being treated.    Stage 4 chronic kidney disease ([place])    Stroke ([place]) 2019   Past Surgical History:  Procedure Laterality Date   callous removal  Left 2017   located on 1st digit on L foot 2/2 diabetes   EYE SURGERY     Family History  Problem Relation Age  of Onset   Kidney disease Mother    Congestive Heart Failure Father    COPD Father    COPD Brother    Diabetes Maternal Aunt    Social History   Socioeconomic History   Marital status: Legally Separated    Spouse name: Rennis Harding   Number of children: 2   Years of education: Highschool and 1 year of collwege in business    Highest education level: High school graduate  Occupational History   Not on file  Tobacco Use   Smoking status: Never   Smokeless tobacco: Never  Vaping Use   Vaping Use: Never used  Substance and Sexual Activity   Alcohol use: No   Drug use: Never   Sexual activity: Not Currently  Other Topics Concern   Not on file  Social History Narrative   Tobacco use, amount per day now: None/Never Smoked   Past tobacco use, amount per day:   How many years did you use tobacco:   Alcohol use (drinks per week): Never    Diet: Carb modified.   Do you drink/eat things with caffeine: yes sometimes.   Marital status:   Single                                What year were you married?   Do you live in a house, apartment, assisted living, condo, trailer, etc.? House   Is it one or more stories? One   How many persons live in your home? Daughter   Do you have pets in your home?( please list) None.   Highest Level of education completed? Associates Degree   Current or past profession: [employer]    Do you exercise?   No                               Type and how often?   Do you have a living will? Yes   Do you have a DNR form?     No                              If not, do you want to discuss one?   Do you have signed POA/HPOA forms?  Yes                   If so, please bring to you appointment      Do you have any difficulty bathing or dressing yourself? Yes   Do you have any difficulty preparing food or eating? No   Do you have any difficulty managing your medications? No   Do you have any difficulty managing your finances? No   Do you have any difficulty affording your medications? No   Social Determinants of Health   Financial Resource Strain: Medium Risk (02/27/2018)   Overall Financial Resource Strain (CARDIA)    Difficulty of Paying Living Expenses: Somewhat hard  Food Insecurity: No Food Insecurity (08/07/2022)   Hunger Vital Sign    Worried About Running Out of Food in the Last Year: Never true    Ran Out of Food in the Last Year: Never true  Transportation Needs: No Transportation Needs (08/07/2022)   PRAPARE - Hydrologist (Medical): No    Lack of Transportation (Non-Medical): No  Physical  Activity: Inactive (02/27/2018)   Exercise Vital Sign    Days of Exercise per Week: 0 days    Minutes of Exercise per Session: 0 min  Stress: No Stress Concern Present (02/27/2018)   [place]    Feeling of Stress : Not at all  Social Connections: Somewhat Isolated (02/27/2018)   Social Connection and Isolation Panel [NHANES]     Frequency of Communication with Friends and Family: More than three times a week    Frequency of Social Gatherings with Friends and Family: Twice a week    Attends Religious Services: Never    Marine scientist or Organizations: No    Attends Music therapist: Never    Marital Status: Married   Allergies  Allergen Reactions   Cephalexin Diarrhea and Nausea And Vomiting   Clonidine Anaphylaxis, Swelling and Other (See Comments)    Tongue swelling   Norvasc [Amlodipine Besylate] Swelling    Edema and TONGUE swelling    Lactose Intolerance (Gi) Diarrhea   Latex Other (See Comments)    Skin blisters   Lisinopril Cough   Morphine And Related Itching and Other (See Comments)    Throat itched and became scratchy   Tape Itching and Other (See Comments)    Some tapes irritate the skin and others don't (redness/itchiness)   Amlodipine Anxiety, Nausea Only and Swelling   Codeine Itching and Rash   Crestor [Rosuvastatin] Rash    Red and flushed in her face    Medications   Medications Prior to Admission  Medication Sig Dispense Refill Last Dose   Accu-Chek Softclix Lancets lancets Use to test blood sugar twice daily. Dx: E11.22 200 each 3    acetaminophen (RA ACETAMINOPHEN) 650 MG CR tablet Take 1 tablet (650 mg total) by mouth every 8 (eight) hours as needed for pain. 100 tablet 2    AMBULATORY NON FORMULARY MEDICATION Take 1 spray by mouth See admin instructions. Medication Name: "Anxious Moments" oral spray- Spray into the mouth at bedtime as directed      Blood Glucose Calibration (ACCU-CHEK GUIDE CONTROL) LIQD Use in Checking blood sugar twice daily. Dx: E11.22 1 each 3    Blood Glucose Monitoring Suppl (ACCU-CHEK GUIDE ME) w/Device KIT Use to test blood sugar twice daily. Dx: E11.22 1 kit 0    clopidogrel (PLAVIX) 75 MG tablet Take 1 tablet (75 mg total) by mouth daily for 18 days. 18 tablet 0    glucose blood (ACCU-CHEK GUIDE) test strip Use to check blood sugar  twice daily. Dx: E11.22 200 each 3    hydrALAZINE (APRESOLINE) 50 MG tablet Take 0.5 tablets (25 mg total) by mouth every 8 (eight) hours.      losartan (COZAAR) 25 MG tablet TAKE 1 TABLET BY MOUTH TWICE A DAY (Patient taking differently: Take 25 mg by mouth daily.) 180 tablet 1    senna-docusate (SENOKOT-S) 8.6-50 MG tablet Take 2 tablets by mouth at bedtime as needed for moderate constipation.        Vitals   Vitals:   08/20/22 0315 08/20/22 0500 08/20/22 1256 08/20/22 2114  BP: (!) 181/72  (!) 159/63 (!) 187/72  Pulse: 66  70 71  Resp: 17  16 15  $ Temp: 97.7 F (36.5 C)  97.8 F (36.6 C) 98.1 F (36.7 C)  TempSrc: Oral   Oral  SpO2: 98%  100% 99%  Weight:  64.7 kg    Height:  Body mass index is 26.09 kg/m.  Physical Exam   General: Frail elderly female laying comfortably in bed; in no acute distress.  HENT: Normal oropharynx and mucosa. Normal external appearance of ears and nose.  Neck: Supple, no pain or tenderness  CV: No JVD. No peripheral edema.  Pulmonary: Symmetric Chest rise. Normal respiratory effort.  Abdomen: Soft to touch, non-tender.  Ext: thin, no cyanosis, edema, R ankle deformity noted. Skin: No rash. Normal palpation of skin. Musculoskeletal: Normal digits and nails by inspection. No clubbing.   Neurologic Examination  Mental status/Cognition: bradyphrenic, alert, oriented to self, place, month and year, good attention.  Speech/language: dysarthric speech, mildly non fluent and takes some pauses and speaks softly, comprehension intact, object naming intact. Cranial nerves:   CN II Pupils equal and reactive to light, no VF deficits   CN III,IV,VI EOM intact, pursuits are not very smooth, no gaze preference or deviation, no nystagmus   CN V normal sensation in V1, V2, and V3 segments bilaterally   CN VII L facial droop   CN VIII normal hearing to speech   CN IX & X poor palatal elevation, no uvular deviation, weak cough.   CN XI 5/5 head turn  and shoulder shrug intact.   CN XII midline tongue protrusion   Motor:  Muscle bulk: poor, tone normal, pronator drift noted in LUE. Mvmt Root Nerve  Muscle Right Left Comments  SA C5/6 Ax Deltoid     EF C5/6 Mc Biceps 5 4+   EE C6/7/8 Rad Triceps 5 4+   WF C6/7 Med FCR     WE C7/8 PIN ECU     F Ab C8/T1 U ADM/FDI 5 4   HF L1/2/3 Fem Illopsoas 4- 5   KE L2/3/4 Fem Quad 4- 5   DF L4/5 D Peron Tib Ant 4+ 5   PF S1/2 Tibial Grc/Sol 4+ 5    Sensation:  Light touch Intact throughout   Pin prick    Temperature    Vibration   Proprioception    Coordination/Complex Motor:  - Finger to Nose with ataxia in LUE, not out of porportion to weakness. - Rapid alternating movement are slowed on the left. - Gait: deferred for patient safety. She has not walked since hospitalization per daughter  Labs   CBC:  Recent Labs  Lab 08/14/22 0730 08/19/22 1825  WBC 7.0 7.3  NEUTROABS 4.1  --   HGB 13.0 12.8  HCT 39.5 39.9  MCV 82.0 84.0  PLT 266 99991111    Basic Metabolic Panel:  Lab Results  Component Value Date   NA 138 08/16/2022   K 3.7 08/16/2022   CO2 23 08/16/2022   GLUCOSE 119 (H) 08/16/2022   BUN 19 08/16/2022   CREATININE 1.66 (H) 08/20/2022   CALCIUM 8.7 (L) 08/16/2022   GFRNONAA 32 (L) 08/20/2022   GFRAA 32 (L) 08/18/2020   Lipid Panel:  Lab Results  Component Value Date   LDLCALC 100 (H) 08/08/2022   HgbA1c:  Lab Results  Component Value Date   HGBA1C 7.8 (H) 08/08/2022   Urine Drug Screen:     Component Value Date/Time   LABOPIA (A) 02/24/2018 1436    Result not available. Reagent lot number recalled by manufacturer.   COCAINSCRNUR NONE DETECTED 02/24/2018 1436   LABBENZ NONE DETECTED 02/24/2018 1436   AMPHETMU NONE DETECTED 02/24/2018 1436   THCU NONE DETECTED 02/24/2018 1436   LABBARB NONE DETECTED 02/24/2018 1436    Alcohol Level  Component Value Date/Time   ETH <10 02/24/2018 1413    CT Head without contrast from 08/19/22 (Personally  reviewed): CTH was negative for a large hypodensity concerning for a large territory infarct or hyperdensity concerning for an [place]  MRI Brain from 08/20/22 (Personally reviewed): Acute infarct along the left anteromedial aspect of the medulla, medial to the area of acute infarct noted on 08/07/2022 MRI.  Impression   TALI HAVENS is a 77 y.o. female with PMH significant for DM2, HTN, macular degeneration, CKD 3a, prior stroke in 2019 with residual L sided weakness who is admitted to rehab after a left lateral medullary stroke, felt to be small vessel stroke. She was leaning more to her right at the rehab on 08/19/22 and had waxing and waning confusion/lethargy which prompted repeat imaging and demonstrated left anteromedial medullary stroke which is medial to the prior noted stroke.  Neurology reengaged to assist with further management. I still suspect that the stroke noted on imaging today is likely small vessel stroke. Could be from completion of prior stroke vs a new small perforator infarct.  I extensively discussed with patient and daughter the underlying mechanism of small vessel disease and risk factors including DM2, HTN, HLD, lack of exercise. I addressed their hesitation to aspirin and plavix, specifically their concern about plavix contributing to lethargy. I suspect that the lethargy is likely from delirium. Daughter does mention days where patient was having trouble with sleep.  Recommendations  - will do Aspirin 30m dialy along with plavix 75 mg daily x 21 days, then followed by Aspirin 821mdaily alone. At the request of patient/daughter, will have these scheduled to be taken at bedtime rather than in AM. - I will also order melatonin 44m34mvailable at bedtime on an as needed basis as a sleep aid. - I discussed the importance of having a fixed routine to reduce delirium. Including having a fixed time to go to bed and get up and stay up in the morning. - I dont see much utility in  vessel imaging or TTE as the stroke appears most consistent with a small vessel but I will have stroke team see patient to see if they think otherwise. - Patient developed a rash to rosuvastatin in the past and thus unable to take statins. Will add Zetia 8m56mily for elevated LDL. Can try PCSK 9 inhibitor in outpatient setting. - stroke team to follow. ______________________________________________________________________  Plan discussed with patient and daughter in detail at bedside.  Thank you for the opportunity to take part in the care of this patient. If you have any further questions, please contact the neurology consultation attending.  Signed,  SalmBrookingser Number [id number] _   _ __   _ __ _ _  __ __   _ __   __ _

## 2022-08-20 NOTE — Progress Notes (Addendum)
Received a call from [facility], reporting MRI results. Call placed to Neurology:  Dr. Lorrin Goodell, MRI results was reviewed.  Dr Ranell Patrick note was reviewed.  Dr. Lorrin Goodell recommended DAPT, ASA and Plavix for three weeks then ASA alone. This provider spoke with Ms. Fazekas daughter [name], MRI results was reviewed and Dr Lorrin Goodell recommendations he will come and assess [name] tonight. Ms. [name] will make her decision regarding the DAPT after speaking with Dr Lorrin Goodell, she states.  Katharine Look RN was asked to place order for Neurology consult, she verbalizes understanding.

## 2022-08-20 NOTE — Progress Notes (Signed)
Notified by  on call Danella Sensing, NP), new ordered received and carried out, assigned new informed

## 2022-08-20 NOTE — Progress Notes (Addendum)
Patient is not available for EEG,currently at MRI.  The patients' nurse relates "The family is requesting for the EEG to be done tomorrow, preferable after her therapy's due to the pt already being exhausted. The last therapy she has scheduled for tomorrow is 1530. "   EEG tech will check touch base with the patient's nurse tomorrow for availability after 1530, as EEG schedule permits.

## 2022-08-20 NOTE — Progress Notes (Signed)
Physical Therapy Session Note  Patient Details  Name: [name] MRN: [id number] Date of Birth: Jun 30, 1946  Today's Date: 08/20/2022 PT Individual Time: 0800-0912 PT Individual Time Calculation (min): 72 min   Short Term Goals: Week 1:  PT Short Term Goal 1 (Week 1): Pt will complete bed mobility with modA PT Short Term Goal 2 (Week 1): Pt will complete bed<>chair transfers with modA and LRAD PT Short Term Goal 3 (Week 1): Pt will ambulate 9f with maxA and LRAD  Skilled Therapeutic Interventions/Progress Updates:      Pt supine in bed to start with her daughter present. Pt noted to be incontinent of bladder with wet linen. TotalA for brief change with daughter assisting. Patient requiring mod/maxA for rolling both directions during pericare and while donning loose fitting shorts. Daughter donned her R ankle brace and tennis shoes with totalA.   Supine<>sitting EOB with maxA for trunk and BLE support. Improved sitting balance compared to yesterday afternoon with less pushing and leaning R. Completed totalA squat<>pivot transfer using over the back technique to facilitate forward weight shift. She required assist for repositioning in TIS w/c as well.   Transported to main rehab gym for time. Used Stedy to work on standing and to safely transfer to mat table. She required maxA to stand from TIS w/c and unable to achieve full upright - very flexed trunk and hips. Even from perched position, she required mod to maxA for standing with very limted buttock clearance from paddles.   In supine and sidelying on mat table, completed there-ex for BLE: -3x8 clam shells on L -static stretching for hip adductors and hamstrings bilaterally - very limited in both -2x10 SAQ with bolster under knees -2x10 hip abd bilaterally -2x10 heel slides bilaterally  Returned to sitting EOB with maxA via log rolling technique. Patient has developed some learned helplesness and awaits for assist prior to  initiating effort. Worked on dynamic sitting balance - reaching outside BOS to targets with her RUE to challenge righting, trunk control, and awareness to her L side.   Returned to TEastman Kodakw/c with maxA with max cues for effort and initiation - does better with facing her rather than over the back, likely due to familiarity.   Returned to her room and patient concluded session seated in w/c with all needs met, daughter updated.    Therapy Documentation Precautions:  Precautions Precautions: Fall Precaution Comments: premorbid L hemi Required Braces or Orthoses: Other Brace Other Brace: R ankle stabilizing brace from prior ankle fx Restrictions Weight Bearing Restrictions: Yes RLE Weight Bearing: Weight bearing as tolerated LLE Weight Bearing: Non weight bearing Other Position/Activity Restrictions: Old L sided weakness from a prior CVA from ~3 years ago. History of R abkle fracture ~1-1.5 yr ago General:     Therapy/Group: Individual Therapy  Jarelle Ates P Miaa Latterell PT 08/20/2022, 7:32 AM

## 2022-08-20 NOTE — Progress Notes (Signed)
PROGRESS NOTE   Subjective/Complaints: Today is a good day but yesterday excessive right sided leaning was noted by patient and multiple therapists, CT Head was stable   ROS: +lethargy as per daughter-improved today, cognition intact as per team, denies pain, CP, SOB, abd pain, right sided lean improved   Objective:   CT HEAD WO CONTRAST (5MM)  Result Date: 08/19/2022 CLINICAL DATA:  Stroke suspected EXAM: CT HEAD WITHOUT CONTRAST TECHNIQUE: Contiguous axial images were obtained from the base of the skull through the vertex without intravenous contrast. RADIATION DOSE REDUCTION: This exam was performed according to the departmental dose-optimization program which includes automated exposure control, adjustment of the mA and/or kV according to patient size and/or use of iterative reconstruction technique. COMPARISON:  08/14/2022 FINDINGS: Brain: No evidence of acute infarction, hemorrhage, mass, mass effect, or midline shift. No hydrocephalus or extra-axial fluid collection. Redemonstrated posterior right MCA distribution infarct, right greater than left basal ganglia lacunar infarcts, and right greater than left remote cerebellar infarcts. Periventricular white matter changes, likely the sequela of chronic small vessel ischemic disease. Previously noted left medulla infarct is not visible on CT. Unchanged right greater than left ventricular prominence. Vascular: No hyperdense vessel. Atherosclerotic calcifications in the intracranial carotid and vertebral arteries. Skull: Negative for fracture or focal lesion. Sinuses/Orbits: No acute finding. Other: The mastoid air cells are well aerated. IMPRESSION: No acute intracranial process. Previously noted left middle infarct is not visible on CT. Electronically Signed   By: Merilyn Baba M.D.   On: 08/19/2022 20:11   Recent Labs    08/19/22 1825  WBC 7.3  HGB 12.8  HCT 39.9  PLT 245    Recent  Labs    08/20/22 0545  CREATININE 1.66*    Intake/Output Summary (Last 24 hours) at 08/20/2022 1005 Last data filed at 08/20/2022 [id number] Gross per 24 hour  Intake 660.76 ml  Output --  Net 660.76 ml        Physical Exam: Vital Signs Blood pressure (!) 181/72, pulse 66, temperature 97.7 F (36.5 C), temperature source Oral, resp. rate 17, height 5' 2"$  (1.575 m), weight 64.7 kg, SpO2 98 %. Constitutional:      Comments: Frail appearing; awake, somewhat alert, but very little speech; Sitting up in bed; daughter at bedside, NAD, BMI 26.09 HENT:     Head: Normocephalic and atraumatic.     Comments: L facial droop- from prior stroke Tongue midline    Right Ear: External ear normal.     Left Ear: External ear normal.     Nose: Nose normal. No congestion.     Mouth/Throat:     Mouth: Mucous membranes are dry.     Pharynx: Oropharynx is clear. No oropharyngeal exudate.  Eyes:     General:        Right eye: No discharge.        Left eye: No discharge.     Extraocular Movements: Extraocular movements intact.     Comments: Nystagmus to L>R  Cardiovascular:     Rate and Rhythm: Normal rate and regular rhythm.     Heart sounds: Normal heart sounds. No murmur heard.    No gallop.  Pulmonary:     Effort: No respiratory distress.     Breath sounds: Normal breath sounds. No wheezing, rhonchi or rales. Good air movement. Abdominal:     General:normoactive BS. There is no distension.     Palpations: Abdomen is soft.     Tenderness: There is no abdominal tenderness.  Musculoskeletal:     Cervical back: Neck supple. No tenderness.     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  Speech is  mildly dysarthric but intelligible.  Follows simple commands.  Provides name and age. Finger to nose on R side ok, but cannot test L Denies dizziness  Psychiatric:     Comments: Flat, decreased interaction  GU: voiding on own! Total A for peri hygiene  Assessment/Plan: 1. Functional deficits which require 3+ hours per day of interdisciplinary therapy in a comprehensive inpatient rehab setting. Physiatrist is providing close team supervision and 24 hour management of active medical problems listed below. Physiatrist and rehab team continue to assess barriers to discharge/monitor patient progress toward functional and medical goals  Care Tool:  Bathing    Body parts bathed by patient: Left arm, Chest, Abdomen, Right upper leg, Left upper leg, Face   Body parts bathed by helper: Right arm, Front perineal area, Buttocks, Right lower leg, Left lower leg     Bathing assist Assist Level: Maximal Assistance - Patient 24 - 49%     Upper Body Dressing/Undressing Upper body dressing   What is the patient wearing?: Pull over shirt    Upper body assist Assist Level: Moderate Assistance - Patient 50 - 74%    Lower Body Dressing/Undressing Lower body dressing      What is the patient wearing?: Underwear/pull up, Pants     Lower body assist Assist for lower body dressing: Total Assistance - Patient < 25%     Toileting Toileting    Toileting assist Assist for toileting: 2 Helpers     Transfers Chair/bed transfer  Transfers assist     Chair/bed transfer assist level: Total Assistance - Patient < 25%     Locomotion Ambulation   Ambulation assist   Ambulation activity did not occur: Safety/medical concerns          Walk 10 feet activity   Assist  Walk 10 feet activity did not occur: Safety/medical concerns        Walk 50 feet activity   Assist Walk 50 feet with 2 turns activity did not occur: Safety/medical concerns         Walk 150 feet  activity   Assist Walk 150 feet activity did not occur: Safety/medical concerns         Walk 10 feet on uneven surface  activity   Assist Walk 10 feet on uneven surfaces activity did not occur: Safety/medical concerns         Wheelchair     Assist Is the patient using a wheelchair?: Yes Type of Wheelchair: Manual    Wheelchair assist level: Maximal Assistance - Patient 25 - 49% Max wheelchair distance: 11f    Wheelchair 50 feet with 2 turns activity    Assist  Assist Level: Total Assistance - Patient < 25%   Wheelchair 150 feet activity     Assist      Assist Level: Total Assistance - Patient < 25%   Blood pressure (!) 181/72, pulse 66, temperature 97.7 F (36.5 C), temperature source Oral, resp. rate 17, height 5' 2"$  (1.575 m), weight 64.7 kg, SpO2 98 %.  Medical Problem List and Plan: 1. 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  Impaired mobility: daughter notes mom does not tolerate lovenox, d/ced, SCDs ordered.              -antiplatelet therapy: Aspirin 81 mg daily, temporarily d/ced plavix as per daughter's request as she said mom was more lethargic after receiving this 3. Pain: N/A. Oxycodone discontinued. 4. Mood/Behavior/Sleep: Zoloft 25 mg daily, trazodone as needed             -antipsychotic agents: N/A 5. Neuropsych/cognition: This patient is? capable of making decisions on her own behalf. 6. Skin/Wound Care: Routine skin checks  -Protective cream to b/l heels 7. Fluids/Electrolytes/Nutrition: Routine in and outs with follow-up  chemistries 8.  Dysphagia.  Dysphagia #3 nectar thick liquids.  Follow-up speech therapy  -2/10 on dys 2 nectar  9.  Hypertension.  Hydralazine 25 mg every 8 hours.  Losartan recently held due to AKI. Daughter prefers slow reduction of BP- at baseline she runs [id number] systolic  [id number] Hydralazine changed to PRN, losartan 45m restarted, daughter reports she had allergy to multiple other BP medications, she will bring a list later time  2/11 She declined bp meds today, BP stable in 160s 10.  Acute urinary retention.  d/c flomax 11.  AKI on CKD stage III.  Baseline creatinine 1.9-2.0.  Cr reviewed from 2/13 and is better than baseline 12.  Diabetes mellitus.  Hemoglobin A1c 7.8.  SSI.  Previously patient had declined initiation of any diabetic medications per documentation.  Will follow-up with family. Placed order for no juice.  13.  History of right ankle fracture.  Patient does wear an ankle stabilizing brace due to previous ankle fx. . 14.  Hyperlipidemia.  Zetia/fenofibrate. 15.  Constipation.  Miralax d/ced as per daughter's preference. Senokot 2 tabs at bedtime as needed.  Sorbitol caused side effects.   -2/10 reports improved after a few BM  2/11 LBM today 16. 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 Kpad ordered 17. Suboptimal vitamin D: continue ergocalciferol 50,000U once per week for 7 weeks, discussed that this can help with fatigue 18. Poor PO fluid intake  -IVF overnight today 527mhr 19. Fluctuating strength: MRI brain and EEG ordered 20. Fatigue: discussed amantadine and modafinil      LOS: 7 days A FACE TO FACE EVALUATION WAS PERFORMED  Mathieu Schloemer P Mylee Falin 08/20/2022, 10:05 AM

## 2022-08-20 NOTE — Progress Notes (Signed)
Family asking about night time fluids that were not scheduled for 2/13. On call provider Danella Sensing Notified and fluids reordered. Family also requesting for EEG and X-ray to be obtained tomorrow 2/14 after therapies.

## 2022-08-20 NOTE — Progress Notes (Signed)
Occupational Therapy Session Note  Patient Details  Name: [name] MRN: [id number] Date of Birth: 1945-11-22  Today's Date: 08/20/2022 OT Individual Time: 1415-1530 OT Individual Time Calculation (min): 75 min    Short Term Goals: Week 1:  OT Short Term Goal 1 (Week 1): Pt will transfer to [facility] with max A +1 consistently OT Short Term Goal 2 (Week 1): Pt will don shirt with min A OT Short Term Goal 3 (Week 1): Pt will sit unsupported during functional task with supervision OT Short Term Goal 4 (Week 1): Pt will perform sit to stand with max A consistently for clothing management with LRAD  Skilled Therapeutic Interventions/Progress Updates:   Pt up in TIS and eager for OT session. Reports feeling much stronger this day. OT transported to open day room gym space. Pt stood 3 trials with mod-max A for hand placement and foot placement as well as forward weight shifts and upright trunk and head. During rest break, OT noted pt's dried scabbed previously skin torn (dtr reported had happened during transport from acute to CIR) was bleeding. Nursing obtained and dressing applied. Pt was then able to complete functional reach in sitting for large game piece Connect 4 requiring B integration, trunk flexion and rotation and B UE reach skills. Pt then able to trace 4 hearts for Valentines craft activity and stood x 3 trials with max A at windowsill to place on contact paper shoulder level with max support to come to upright standing with L knee blocking and head and chest points of contact. Once back in room, OT transferred pt to R side via squat pivot transfer with max A laterally as pt had EEG scheduled. Left pt in reflex inhibiting resting position in sidelying with dtr present bedside, nurse call button and bed exit engaged.    Therapy Documentation Precautions:  Precautions Precautions: Fall Precaution Comments: premorbid L hemi Required Braces or Orthoses: Other Brace Other Brace: R ankle  stabilizing brace from prior ankle fx Restrictions Weight Bearing Restrictions: Yes RLE Weight Bearing: Weight bearing as tolerated LLE Weight Bearing: Non weight bearing Other Position/Activity Restrictions: Old L sided weakness from a prior CVA from ~3 years ago. History of R abkle fracture ~1-1.5 yr ago     Therapy/Group: Individual Therapy  Barnabas Lister 08/20/2022, 7:41 AM

## 2022-08-20 NOTE — Procedures (Incomplete)
Patient Name: [name]  MRN: [id number]  Epilepsy Attending: Lora Havens  Referring Physician/Provider: Izora Ribas, MD  Date: 08/20/2022 Duration:   Patient history: 77yo M with ams. EEG to evaluate for seizure  Level of alertness: Awake, drowsy, sleep, comatose, lethargic ***  AEDs during EEG study: None  Technical aspects: This EEG study was done with scalp electrodes positioned according to the 10-20 International system of electrode placement. Electrical activity was reviewed with band pass filter of 1-70Hz$ , sensitivity of 7 uV/mm, display speed of 56m/sec with a 60Hz$  notched filter applied as appropriate. EEG data were recorded continuously and digitally stored.  Video monitoring was available and reviewed as appropriate.  Description: The posterior dominant rhythm consists of 9-10 Hz activity of moderate voltage (25-35 uV) seen predominantly in posterior head regions, symmetric and reactive to eye opening and eye closing. Drowsiness was characterized by attenuation of the posterior background rhythm. Sleep was characterized by vertex waves, sleep spindles (12 to 14 Hz), maximal frontocentral region.  There is an excessive amount of 15 to 18 Hz, 2-3 uV beta activity with irregular morphology distributed symmetrically and diffusely.   EEG showed continuous/intermittent generalized polymorphic sharply contoured 3 to 6 Hz theta-delta slowing.  EEG showed generalized periodic discharges with triphasic morphology at  Hz, more prominent when awake/stimulated.  Generalized Spike/Polyspikes/Sharp waves were noted in left/right frontal/temporal/parietal/occipital region.   Seizure was noted arising from left/right frontal/temporal/parietal/occipital region.  During seizure, patient was noted to.  Onset of seizure, total duration  Event button was pressed on at for .  Concomitant EEG before, during and after the event showed normal posterior dominant rhythm, did not show any  EEG changes suggest seizure.  Patient was noted to have episodes of brief sudden eye opening with whole body jerking every few seconds.  Concomitant EEG showed generalized polyspikes consistent with myoclonic seizures.  In between seizures EEG showed generalized background suppression.   Hyperventilation did not show any EEG change.  Physiologic photic driving was not seen during photic stimulation.  Hyperventilation and photic stimulation were not performed.     ABNORMALITY -Sharp wave, generalized, left/right frontal/temporal/parietal/occipital region.  -Spike,generalized, left/right frontal/temporal/parietal/occipital region.  -Polyspikes, generalized, left/right frontal/temporal/parietal/occipital region.  - Lateralized periodic discharges with overriding fast activity ( LPD +) left/right, maximal frontal/temporal/parietal/occipital region - Periodic discharges with triphasic morphology, generalized ( GPDs) - Intermittent slow, generalized - Continuous slow, generalized - Excessive beta, generalized    IMPRESSION: This study is within normal limits.  This study is suggestive of mild/moderate/severe diffuse encephalopathy, nonspecific etiology but likely related to sedation, toxic-metabolic etiology, anoxic/hypoxic brain injury This study is suggestive of cortical dysfunction arising from left/right frontal/temporal/parietal/occipital region, nonspecific etiology, likely secondary to underlying structural abnormality This study showed evidence of potential epileptogenicity arising from This study showed seizures arising from No seizures or epileptiform discharges were seen throughout the recording. However, only wakefulness and drowsiness were recorded. If suspicion for interictal activity remains a concern, a prolonged study including sleep should be considered.  The excessive beta activity seen in the background is most likely due to the effect of benzodiazepine and is a benign EEG  pattern. Patient was noted to have myoclonic seizures every few seconds.  Additionally there was evidence of severe to profound diffuse encephalopathy.  In the setting of cardiac arrest, this EEG pattern is suggestive of anoxic/hypoxic brain injury.  Dr. was notified.  A normal interictal EEG does not exclude nor support the diagnosis of epilepsy.   Bowie Doiron OBarbra Sarks

## 2022-08-20 NOTE — Progress Notes (Signed)
Speech Language Pathology Daily Session Note  Patient Details  Name: [name] MRN: [id number] Date of Birth: 12-18-1945  Today's Date: 08/20/2022 SLP Individual Time: 0930-1030 SLP Individual Time Calculation (min): 60 min  Short Term Goals: Week 1: SLP Short Term Goal 1 (Week 1): Pt will participate in therapeutic trials of thin liquid via tsp with no s/sx concerning for airway intrusion to assess readiness for repeat swallow study. SLP Short Term Goal 2 (Week 1): Pt will demonstrate tolerance of current diet textures with minimal s/sx concerning for airway intrusion with Mod A. SLP Short Term Goal 3 (Week 1): Pt will recall and utilize speech intelligibility strategies to improve intelligibilty at the phrase and sentence level to 90%. SLP Short Term Goal 4 (Week 1): Pt will complete formal cognitive-linguistic evaluation to further determine ST POC with 100% completion. SLP Short Term Goal 4 - Progress (Week 1): Met SLP Short Term Goal 5 (Week 1): Pt will verbally recall speech intelligibilty and safe swallowing strategies given Mod A with 50% accuracy.  Skilled Therapeutic Interventions:   Pt seen for skilled SLP session to address dysphagia goals. Per PT, pt was observed to be coughing with NTLs and mech ground solids yesterday. Daughter reported pt appeared weaker yesterday, though feels like she is doing better today. SLP addressed diet tolerance of current diet with skilled re-assessment of her tolerance of mech ground breakfast tray items and nectar-thick liquids by cup. Pt consumed 80-90% of meal. She required min-mod cues throughout meal to take small bites and avoid taking consecutive bites prior to clearing oral cavity. There was x1 coughing instance following large bite of ground sausage+NTL wash. No further concerns for aspiration were observed with PO intake. Thorough oral care completed following meal. Noted thick lingual coating, concerning for oral thrush. Message sent to  physician requesting Nystatin mouth rinse as medically indicated. Pt left sitting up in w/c with call bell in reach and daughter at bedside. Continue SLP Poc.   Pain Pain Assessment Pain Scale: 0-10 Pain Score: 0-No pain  Therapy/Group: Individual Therapy  Wyn Forster 08/20/2022, 12:18 PM

## 2022-08-21 DIAGNOSIS — E119 Type 2 diabetes mellitus without complications: Secondary | ICD-10-CM

## 2022-08-21 DIAGNOSIS — E785 Hyperlipidemia, unspecified: Secondary | ICD-10-CM

## 2022-08-21 LAB — URINALYSIS, ROUTINE W REFLEX MICROSCOPIC
Bilirubin Urine: NEGATIVE
Glucose, UA: 50 mg/dL — AB
Hgb urine dipstick: NEGATIVE
Ketones, ur: NEGATIVE mg/dL
Leukocytes,Ua: NEGATIVE
Nitrite: NEGATIVE
Protein, ur: 100 mg/dL — AB
Specific Gravity, Urine: 1.009 (ref 1.005–1.030)
pH: 7 (ref 5.0–8.0)

## 2022-08-21 LAB — COMPREHENSIVE METABOLIC PANEL
ALT: 19 U/L (ref 0–44)
AST: 19 U/L (ref 15–41)
Albumin: 3 g/dL — ABNORMAL LOW (ref 3.5–5.0)
Alkaline Phosphatase: 111 U/L (ref 38–126)
Anion gap: 10 (ref 5–15)
BUN: 21 mg/dL (ref 8–23)
CO2: 18 mmol/L — ABNORMAL LOW (ref 22–32)
Calcium: 8.7 mg/dL — ABNORMAL LOW (ref 8.9–10.3)
Chloride: 107 mmol/L (ref 98–111)
Creatinine, Ser: 1.8 mg/dL — ABNORMAL HIGH (ref 0.44–1.00)
GFR, Estimated: 29 mL/min — ABNORMAL LOW (ref >60)
Glucose, Bld: 155 mg/dL — ABNORMAL HIGH (ref 70–99)
Potassium: 4.1 mmol/L (ref 3.5–5.1)
Sodium: 135 mmol/L (ref 135–145)
Total Bilirubin: 0.3 mg/dL (ref 0.3–1.2)
Total Protein: 6 g/dL — ABNORMAL LOW (ref 6.5–8.1)

## 2022-08-21 LAB — CBC
HCT: 39.8 % (ref 36.0–46.0)
Hemoglobin: 13.1 g/dL (ref 12.0–15.0)
MCH: 27.3 pg (ref 26.0–34.0)
MCHC: 32.9 g/dL (ref 30.0–36.0)
MCV: 82.9 fL (ref 80.0–100.0)
Platelets: 273 10*3/uL (ref 150–400)
RBC: 4.8 MIL/uL (ref 3.87–5.11)
RDW: 15.9 % — ABNORMAL HIGH (ref 11.5–15.5)
WBC: 7.3 10*3/uL (ref 4.0–10.5)
nRBC: 0 % (ref 0.0–0.2)

## 2022-08-21 LAB — GLUCOSE, CAPILLARY: Glucose-Capillary: 148 mg/dL — ABNORMAL HIGH (ref 70–99)

## 2022-08-21 MED ORDER — LOSARTAN POTASSIUM 50 MG PO TABS
25.0000 mg | ORAL_TABLET | Freq: Two times a day (BID) | ORAL | Status: DC
  Administered 2022-08-21 – 2022-09-02 (×24): 25 mg via ORAL
  Filled 2022-08-21 (×26): qty 1

## 2022-08-21 MED ORDER — SODIUM CHLORIDE 0.9 % IV SOLN: Freq: Once | INTRAVENOUS | Status: AC

## 2022-08-21 NOTE — Progress Notes (Signed)
PROGRESS NOTE   Subjective/Complaints: Today is a good day but yesterday excessive right sided leaning was noted by patient and multiple therapists, CT Head was stable   ROS: +lethargy as per daughter-improved today, cognition intact as per team, denies pain, CP, SOB, abd pain, right sided lean improved. +fatigue   Objective:   DG Chest 2 View  Result Date: 08/20/2022 CLINICAL DATA:  Cough EXAM: CHEST - 2 VIEW COMPARISON:  Chest x-ray 08/07/2022 FINDINGS: The heart is mildly enlarged, unchanged. There central pulmonary vascular congestion. There is no focal lung infiltrate, pleural effusion or pneumothorax. No acute fractures are seen. IMPRESSION: Mild cardiomegaly with central pulmonary vascular congestion. Electronically Signed   By: Ronney Asters M.D.   On: 08/20/2022 20:43   [name] CONTRAST  Result Date: 08/20/2022 CLINICAL DATA:  Stroke suspected EXAM: MRI HEAD WITHOUT CONTRAST TECHNIQUE: Multiplanar, multiecho pulse sequences of the brain and surrounding structures were obtained without intravenous contrast. COMPARISON:  08/07/2022 MRI head, correlation is also made with CT head 08/19/2022 FINDINGS: Brain: Restricted diffusion with ADC correlate along the left anteromedial aspect of the medulla (series 2, image 12 and series 1050, image 12). This is medial to the area of acute infarct noted on 08/07/2022. No acute hemorrhage, mass, mass effect, or midline shift. No hydrocephalus or extra-axial collection. Redemonstrated chronic right parietal infarct and right greater than left cerebellar infarcts, as well as bilateral basal ganglia/corona radiata lacunar infarcts. Advanced cerebral volume loss for age. Confluent T2 hyperintense signal in the periventricular white matter and pons, likely the sequela of moderate to severe chronic small vessel ischemic disease. Vascular: Normal arterial flow voids. Skull and upper cervical spine:  Normal marrow signal. Sinuses/Orbits: Clear paranasal sinuses. Status post bilateral lens replacements. Other: The mastoids are well aerated. IMPRESSION: Acute infarct along the left anteromedial aspect of the medulla, medial to the area of acute infarct noted on 08/07/2022 MRI. These results will be called to the ordering clinician or representative by the Radiologist Assistant, and communication documented in the PACS or [employer]. Electronically Signed   By: Merilyn Baba M.D.   On: 08/20/2022 19:55   CT HEAD WO CONTRAST (5MM)  Result Date: 08/19/2022 CLINICAL DATA:  Stroke suspected EXAM: CT HEAD WITHOUT CONTRAST TECHNIQUE: Contiguous axial images were obtained from the base of the skull through the vertex without intravenous contrast. RADIATION DOSE REDUCTION: This exam was performed according to the departmental dose-optimization program which includes automated exposure control, adjustment of the mA and/or kV according to patient size and/or use of iterative reconstruction technique. COMPARISON:  08/14/2022 FINDINGS: Brain: No evidence of acute infarction, hemorrhage, mass, mass effect, or midline shift. No hydrocephalus or extra-axial fluid collection. Redemonstrated posterior right MCA distribution infarct, right greater than left basal ganglia lacunar infarcts, and right greater than left remote cerebellar infarcts. Periventricular white matter changes, likely the sequela of chronic small vessel ischemic disease. Previously noted left medulla infarct is not visible on CT. Unchanged right greater than left ventricular prominence. Vascular: No hyperdense vessel. Atherosclerotic calcifications in the intracranial carotid and vertebral arteries. Skull: Negative for fracture or focal lesion. Sinuses/Orbits: No acute finding. Other: The mastoid air cells are well aerated.  IMPRESSION: No acute intracranial process. Previously noted left middle infarct is not visible on CT. Electronically Signed   By:  Merilyn Baba M.D.   On: 08/19/2022 20:11   Recent Labs    08/19/22 1825  WBC 7.3  HGB 12.8  HCT 39.9  PLT 245    Recent Labs    08/20/22 0545  CREATININE 1.66*    Intake/Output Summary (Last 24 hours) at 08/21/2022 1023 Last data filed at 08/21/2022 0900 Gross per 24 hour  Intake 1029.78 ml  Output --  Net 1029.78 ml        Physical Exam: Vital Signs Blood pressure (!) 172/68, pulse 74, temperature 97.9 F (36.6 C), temperature source Oral, resp. rate 15, height 5' 2"$  (1.575 m), weight 64.7 kg, SpO2 98 %. Constitutional:      Comments: Frail appearing; awake, somewhat alert, but very little speech; Sitting up in bed; daughter at bedside, NAD, BMI 26.09 HENT:     Head: Normocephalic and atraumatic.     Comments: L facial droop- from prior stroke Tongue midline    Right Ear: External ear normal.     Left Ear: External ear normal.     Nose: Nose normal. No congestion.     Mouth/Throat:     Mouth: Mucous membranes are dry.     Pharynx: Oropharynx is clear. No oropharyngeal exudate.  Eyes:     General:        Right eye: No discharge.        Left eye: No discharge.     Extraocular Movements: Extraocular movements intact.     Comments: Nystagmus to L>R  Cardiovascular:     Rate and Rhythm: Normal rate and regular rhythm.     Heart sounds: Normal heart sounds. No murmur heard.    No gallop.  Pulmonary:     Effort: No respiratory distress.     Breath sounds: Normal breath sounds. No wheezing, rhonchi or rales. Good air movement. Abdominal:     General:normoactive BS. There is no distension.     Palpations: Abdomen is soft.     Tenderness: There is no abdominal tenderness.  Musculoskeletal:     Cervical back: Neck supple. No tenderness.     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  Speech is mildly dysarthric but intelligible.  Follows simple commands.  Provides name and age. Finger to nose on R side ok, but cannot test L Denies dizziness  Psychiatric:     Comments: Flat, decreased interaction  GU: voiding on own! Total A for peri hygiene  Assessment/Plan: 1. Functional deficits which require 3+ hours per day of interdisciplinary therapy in a comprehensive inpatient rehab setting. Physiatrist is providing close team supervision and 24 hour management of active medical problems listed below. Physiatrist and rehab team continue to assess barriers to discharge/monitor patient progress toward functional and medical goals  Care Tool:  Bathing    Body parts bathed by patient: Left arm, Chest, Abdomen, Right upper leg, Left upper leg, Face   Body parts bathed by helper: Right arm, Front perineal area, Buttocks, Right lower leg, Left lower leg     Bathing  assist Assist Level: Maximal Assistance - Patient 24 - 49%     Upper Body Dressing/Undressing Upper body dressing   What is the patient wearing?: Pull over shirt    Upper body assist Assist Level: Moderate Assistance - Patient 50 - 74%    Lower Body Dressing/Undressing Lower body dressing      What is the patient wearing?: Underwear/pull up, Pants     Lower body assist Assist for lower body dressing: Total Assistance - Patient < 25%     Toileting Toileting    Toileting assist Assist for toileting: 2 Helpers     Transfers Chair/bed transfer  Transfers assist     Chair/bed transfer assist level: Total Assistance - Patient < 25%     Locomotion Ambulation   Ambulation assist   Ambulation activity did not occur: Safety/medical concerns          Walk 10 feet activity   Assist  Walk 10 feet activity did not occur: Safety/medical concerns         Walk 50 feet activity   Assist Walk 50 feet with 2 turns activity did not occur: Safety/medical concerns         Walk 150 feet activity   Assist Walk 150 feet activity did not occur: Safety/medical concerns         Walk 10 feet on uneven surface  activity   Assist Walk 10 feet on uneven surfaces activity did not occur: Safety/medical concerns         Wheelchair     Assist Is the patient using a wheelchair?: Yes Type of Wheelchair: Manual    Wheelchair assist level: Maximal Assistance - Patient 25 - 49% Max wheelchair distance: 63f    Wheelchair 50 feet with 2 turns activity    Assist        Assist Level: Total Assistance - Patient < 25%   Wheelchair 150 feet activity     Assist      Assist Level: Total Assistance - Patient < 25%   Blood pressure (!) 172/68, pulse 74, temperature 97.9 F (36.6 C), temperature source Oral, resp. rate 15, height 5' 2"$  (1.575 m), weight 64.7 kg, SpO2 98 %.  Medical Problem List and Plan: 1. 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  Impaired mobility: daughter notes mom does not tolerate lovenox, d/ced, SCDs ordered.              -antiplatelet therapy: Aspirin 81 mg daily, temporarily d/ced plavix as per daughter's request as she said mom was more lethargic after receiving this 3. Pain: N/A. Oxycodone discontinued. 4. Mood/Behavior/Sleep: Zoloft 25 mg daily, trazodone as needed             -antipsychotic agents: N/A 5. Neuropsych/cognition: This patient is? capable of making decisions on her own behalf. 6.  Skin/Wound Care: Routine skin checks  -Protective cream to b/l heels 7. Fluids/Electrolytes/Nutrition: Routine in and outs with follow-up chemistries 8.  Dysphagia.  Dysphagia #3 nectar thick liquids.  Follow-up speech  therapy  -2/10 on dys 2 nectar  9.  Hypertension.  Hydralazine 25 mg every 8 hours.  Losartan recently held due to AKI. Daughter prefers slow reduction of BP- at baseline she runs [id number] systolic  [id number] Hydralazine changed to PRN, losartan 89m restarted, daughter reports she had allergy to multiple other BP medications, she will bring a list later time  2/11 She declined bp meds today, BP stable in 160s 10.  Acute urinary retention.  d/c flomax 11.  AKI on CKD stage III.  Baseline creatinine 1.9-2.0.  Cr reviewed from 2/13 and is better than baseline 12.  Diabetes mellitus.  Hemoglobin A1c 7.8.  SSI.  Previously patient had declined initiation of any diabetic medications per documentation.  Will follow-up with family. Placed order for no juice.  13.  History of right ankle fracture.  Patient does wear an ankle stabilizing brace due to previous ankle fx. . 14.  Hyperlipidemia.  Zetia/fenofibrate. 15.  Constipation.  Miralax d/ced as per daughter's preference. Senokot 2 tabs at bedtime as needed.  Sorbitol caused side effects.   -2/10 reports improved after a few BM  2/11 LBM today 16. 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 Kpad ordered. Discuss oral spasticity medications 17. Suboptimal vitamin D: continue ergocalciferol 50,000U once per week for 7 weeks, discussed that this can help with fatigue 18. Poor PO fluid intake  -IVF overnight today 527mhr 19. Fluctuating strength: MRI brain and EEG ordered 20. Fatigue: discussed amantadine and modafinil 21. HTN: increase losartan to 2531mD 22. New stroke: continue aspirin/plavix 21 days from 2/13      LOS: 8 days A  FACE TO FACE EVALUATION WAS PERFORMED  KruMartha ClanRaulkar 08/21/2022, 10:23 AM

## 2022-08-21 NOTE — Progress Notes (Signed)
Patient ID: [name], female   DOB: 30-May-1946, 77 y.o.   MRN: [id number]  Met with pt and two daughter;s who were present in her room to give team conference update goals of mod assist wheelchair level and discharge target 2/21. All are hopeful with her diagnosis of new CVA she can move forward and start making progress. She has been participating even with not feeling well and declining in function this is how much she wants to improve. Daughter's feel she will show progress and the discharge date can be extended. Will touch base on Monday and consult team on possible extension. Daughter's want her to get as much therapy as she can due to this is the place she needs to be. Will continue to work on discharge needs. May need to being with home health due to difficult car transfer and then transition to OP again like she was prior to admission.

## 2022-08-21 NOTE — Progress Notes (Signed)
Speech Language Pathology Weekly Progress and Session Note  Patient Details  Name: [name] MRN: [id number] Date of Birth: 02-04-1946  Beginning of progress report period: August 14, 2022 End of progress report period: August 21, 2022  Today's Date: 08/21/2022 SLP Individual Time: 1300-1355 SLP Individual Time Calculation (min): 55 min  Short Term Goals: Week 1: SLP Short Term Goal 1 (Week 1): Pt will participate in therapeutic trials of thin liquid via tsp with no s/sx concerning for airway intrusion to assess readiness for repeat swallow study. SLP Short Term Goal 1 - Progress (Week 1): Not met SLP Short Term Goal 2 (Week 1): Pt will demonstrate tolerance of current diet textures with minimal s/sx concerning for airway intrusion with Mod A. SLP Short Term Goal 2 - Progress (Week 1): Not met SLP Short Term Goal 3 (Week 1): Pt will recall and utilize speech intelligibility strategies to improve intelligibilty at the phrase and sentence level to 90%. SLP Short Term Goal 3 - Progress (Week 1): Not met SLP Short Term Goal 4 (Week 1): Pt will complete formal cognitive-linguistic evaluation to further determine ST POC with 100% completion. SLP Short Term Goal 4 - Progress (Week 1): Met SLP Short Term Goal 5 (Week 1): Pt will verbally recall speech intelligibilty and safe swallowing strategies given Mod A with 50% accuracy. SLP Short Term Goal 5 - Progress (Week 1): Met    New Short Term Goals: Week 2: SLP Short Term Goal 1 (Week 2): STG's = LTG's due to ELOS  Weekly Progress Updates: Pt with minimal progress this past reporting period, only met 2 out of 5 short-term goals. Progress complicated by new stroke and fatigue + poor endurance. Pt recently downgraded to Dysphagia 1 from Dysphagia 2 following new stroke given prolonged oral transit and decreased mastication. Continues to require at least Mod A verbal cues to slow intake rate and following aspiration precautions - remains  impulsive with little to no insight into deficits. Will plan to complete MBSS in upcoming sessions to determine safety for current diet textures and readiness for upgrade. Speech intelligibility remains diminished given limited carryover and use of strategies without extensive skilled intervention. Pt and family education ongoing. Recommend continuation of updated ST POC for upcoming reporting period, as well as f/u ST intervention at next venue of care + 24/7 supervision + assistance for safety.  Intensity: Minumum of 1-2 x/day, 30 to 90 minutes Frequency: 3 to 5 out of 7 days Duration/Length of Stay: 12-14 days Treatment/Interventions: Cognitive remediation/compensation;Dysphagia/aspiration precaution training;Other (comment);Patient/family education   Daily Session Skilled Therapeutic Interventions:     Pt seen this date for skilled ST intervention targeting swallowing and  goals outlined in care plan. Pt received awake/alert and OOB in TIS; recently finished lunch. Daughter present at onset of session. Agreeable to intervention in speech office. Flat affect noted throughout with decreased attention to L field. Wet vocal quality appreciated upon phonation; cleared with Mod A demonstration and verbal cues to produce volitional throat clear and cough. Pt's daughter reports improvement in cough with lunch. Provided intermittent rest breaks and pressure relief in TIS.  SLP facilitated today's session by providing ongoing skilled education re: compensatory speech intelligibility and swallowing strategies; pt recalled both given Mod A verbal cues. Utilized medicine cups with pureed, dysphagia 2, and nectar-thick liquid consistencies to slow pt's intake rate and reinforce alternating bites/sips, which appeared effective with additional verbal cues. During PO intake, pt continues to present with inconsistent s/sx concerning for compromised airway  protection to include wet vocal quality and intermittent  coughing. Continues to benefit from intermittent verbal cues for swallow initiation "1, 2, 3, swallow." Given increase in oral holding this date, recommend downgrading to Dysphagia 1 textures with nectar-thick liquids remaining the same, and plans for completing repeat instrumental swallow assessment in upcoming sessions.  Re: speech intelligibility, pt implemented speech intelligibility strategies (diaphragmatic breathing, pausing/chunking, and over-articulation) within structured verbal expression tasks at the phrase level with Mod-Max A multimodal cues, this allowed pt to achieve ~85% intelligibility within known context. Without context, pt was <50% intelligible at times with no awareness of how she is perceived by others - this likely occurred due to fatigue.  Pt returned to room and left OOB in TIS with all safety measures activated, call bell reviewed and within reach, and all immediate needs met. Continue per current ST POC.  Pain No pain reported; NAD  Therapy/Group: Individual Therapy  Jaikob Borgwardt A Chaselyn Nanney 08/21/2022, 4:03 PM

## 2022-08-21 NOTE — Progress Notes (Signed)
Occupational Therapy Weekly Progress Note  Patient Details  Name: [name] MRN: [id number] Date of Birth: 24-Oct-1945  Beginning of progress report period: August 14, 2022 End of progress report period: August 21, 2022  Today's Date: 08/21/2022 OT Individual Time: [id number] OT Individual Time Calculation (min): 57 min    Patient has met 2 of 4 short term goals.  Pt is making slow but steady progress towards OT goals. Pt has had waxing and waning alertness/arousal with continued concerns of CVA evolvement which is under review. Overall pt remains MIN-S for sitting balance, MIN A to don shirt at EOB, total A to don pants and MAX-total A to sit to stand in the stedy with poor posture d/t decreased strength and deconditioning. Pt remains motivated and daughter is very supportive, however the burden of care is still high. Initiated SB transfer training to decrease BOC and improve pt initiation with mobility tasks.  Patient continues to demonstrate the following deficits: muscle weakness, decreased cardiorespiratoy endurance, abnormal tone, motor apraxia, decreased coordination, and decreased motor planning, decreased visual perceptual skills, decreased midline orientation and decreased attention to left, decreased attention, decreased awareness, decreased problem solving, decreased safety awareness, decreased memory, and delayed processing, and decreased sitting balance, decreased standing balance, decreased postural control, hemiplegia, and decreased balance strategies and therefore will continue to benefit from skilled OT intervention to enhance overall performance with BADL and Reduce care partner burden.  Patient progressing toward long term goals..  Continue plan of care.  OT Short Term Goals Week 1:  OT Short Term Goal 1 (Week 1): Pt will transfer to [facility] with max A +1 consistently OT Short Term Goal 1 - Progress (Week 1): Progressing toward goal OT Short Term Goal 2 (Week 1): Pt will  don shirt with min A OT Short Term Goal 2 - Progress (Week 1): Met OT Short Term Goal 3 (Week 1): Pt will sit unsupported during functional task with supervision OT Short Term Goal 3 - Progress (Week 1): Met OT Short Term Goal 4 (Week 1): Pt will perform sit to stand with max A consistently for clothing management with LRAD OT Short Term Goal 4 - Progress (Week 1): Progressing toward goal Week 2:  OT Short Term Goal 1 (Week 2): Pt will transfer to [facility] wiht +1 A and LRAD OT Short Term Goal 2 (Week 2): Pt will don shirt with S OT Short Term Goal 3 (Week 2): Pt will bridge hips in bed with MIN A to improve BOC wiht clothing managment OT Short Term Goal 4 (Week 2): pt will thread 1LE into pants wiht AE PRN  Skilled Therapeutic Interventions/Progress Updates:     Pt received in TIS with daughter present and no pain. Pt requesting lotion which was provided  Therapeutic activity Pt applies lotion to BUE for  LUE NMR.   Fucnitonal transfer training with SB TIS<>EOM 2x with 2" step below feet. Pt uphil transfer initially MAX A on first trial decreasing to MOD A second trial with improved head hips relationship. Cuing for hand and foot placement/management throughout transfer. Pt able to complete down hill transfers with MIN A   Seated NMR for trunk reaching for beanbags, WB into L elbow and correcting back to midline with mod cuing and overall supervision for siting balance.   Pt left at end of session in TIW with daughter present talking to neurologist, call light in reach and all needs met   Therapy Documentation Precautions:  Precautions Precautions: Fall Precaution Comments:  premorbid L hemi Required Braces or Orthoses: Other Brace Other Brace: R ankle stabilizing brace from prior ankle fx Restrictions Weight Bearing Restrictions: Yes RLE Weight Bearing: Weight bearing as tolerated LLE Weight Bearing: Non weight bearing Other Position/Activity Restrictions: Old L sided weakness from a  prior CVA from ~3 years ago. History of R abkle fracture ~1-1.5 yr ago General:    Therapy/Group: Individual Therapy  Tonny Branch 08/21/2022, 6:52 AM

## 2022-08-21 NOTE — Progress Notes (Addendum)
STROKE TEAM PROGRESS NOTE   INTERVAL HISTORY Her daughter is at the bedside.  Aspirin and   Plavix scheduled at night.  She states that her mother has had a steady decline since she was admitted to the hospital, but she is doing better today than she was yesterday. She has not ambulated with therapy, but she does do sit to stand and slide board transfers. Paucity of speech but no aphasia noted.  Patient daughter had expressed concerns about giving Plavix and aspirin as she felt patient's drowsiness may have been related to these medications  Vitals:   08/20/22 2114 08/21/22 0448 08/21/22 0504 08/21/22 0609  BP: (!) 187/72 (!) 192/66 (!) 194/82 (!) 172/68  Pulse: 71 74    Resp: 15 15    Temp: 98.1 F (36.7 C) 97.9 F (36.6 C)    TempSrc: Oral Oral    SpO2: 99% 98%    Weight:      Height:       CBC:  Recent Labs  Lab 08/19/22 1825  WBC 7.3  HGB 12.8  HCT 39.9  MCV 84.0  PLT 99991111   Basic Metabolic Panel:  Recent Labs  Lab 08/16/22 1233 08/20/22 0545  NA 138  --   K 3.7  --   CL 105  --   CO2 23  --   GLUCOSE 119*  --   BUN 19  --   CREATININE 1.79* 1.66*  CALCIUM 8.7*  --   MG 2.1  --    Lipid Panel: No results for input(s): "CHOL", "TRIG", "HDL", "CHOLHDL", "VLDL", "LDLCALC" in the last 168 hours. HgbA1c: No results for input(s): "HGBA1C" in the last 168 hours. Urine Drug Screen: No results for input(s): "LABOPIA", "COCAINSCRNUR", "LABBENZ", "AMPHETMU", "THCU", "LABBARB" in the last 168 hours.  Alcohol Level No results for input(s): "ETH" in the last 168 hours.  IMAGING past 24 hours DG Chest 2 View  Result Date: 08/20/2022 CLINICAL DATA:  Cough EXAM: CHEST - 2 VIEW COMPARISON:  Chest x-ray 08/07/2022 FINDINGS: The heart is mildly enlarged, unchanged. There central pulmonary vascular congestion. There is no focal lung infiltrate, pleural effusion or pneumothorax. No acute fractures are seen. IMPRESSION: Mild cardiomegaly with central pulmonary vascular congestion.  Electronically Signed   By: Ronney Asters M.D.   On: 08/20/2022 20:43   [name] CONTRAST  Result Date: 08/20/2022 CLINICAL DATA:  Stroke suspected EXAM: MRI HEAD WITHOUT CONTRAST TECHNIQUE: Multiplanar, multiecho pulse sequences of the brain and surrounding structures were obtained without intravenous contrast. COMPARISON:  08/07/2022 MRI head, correlation is also made with CT head 08/19/2022 FINDINGS: Brain: Restricted diffusion with ADC correlate along the left anteromedial aspect of the medulla (series 2, image 12 and series 1050, image 12). This is medial to the area of acute infarct noted on 08/07/2022. No acute hemorrhage, mass, mass effect, or midline shift. No hydrocephalus or extra-axial collection. Redemonstrated chronic right parietal infarct and right greater than left cerebellar infarcts, as well as bilateral basal ganglia/corona radiata lacunar infarcts. Advanced cerebral volume loss for age. Confluent T2 hyperintense signal in the periventricular white matter and pons, likely the sequela of moderate to severe chronic small vessel ischemic disease. Vascular: Normal arterial flow voids. Skull and upper cervical spine: Normal marrow signal. Sinuses/Orbits: Clear paranasal sinuses. Status post bilateral lens replacements. Other: The mastoids are well aerated. IMPRESSION: Acute infarct along the left anteromedial aspect of the medulla, medial to the area of acute infarct noted on 08/07/2022 MRI. These results will be  called to the ordering clinician or representative by the Radiologist Assistant, and communication documented in the PACS or [employer]. Electronically Signed   By: Merilyn Baba M.D.   On: 08/20/2022 19:55    PHYSICAL EXAM General - well nourished, well developed, in no apparent distress. Ophthalmologic - fundi not visualized due to noncooperation.   Cardiovascular - regular rhythm and rate   Neuro - awake, alert, eyes open, orientated to age, place, time and people. No  aphasia, paucity of speech, following all simple commands. Able to name and repeat, but mild dysarthria.  No gaze palsy, tracking bilaterally, visual field full, PERRL. Left facial droop. Tongue midline.  RUE 4/5 and LUE proximal 3/5, tricep 3/5, bicep 4/5, finger grip 3/5 but significant increased flexion tone. BLE 3/5 proximal and 4/5 distal.  Sensation symmetrical bilaterally, right FTN intact, gait not tested.   ASSESSMENT/PLAN [name] is a 77 y.o. female with history of DM2, HTN, macular degeneration, CKD 3a, prior stroke in 2019 with residual L sided weakness who presented to the ED on 08/07/22 with worsening lethargy and generalized weakness and poor po intake. she was noted to excessively lean to the right yesterday with fluctuating mentation. She has good and bad days. She had MRI Brain w/o contrast which demonstrated a new (`likely extension of previous) acute left anteromedial infarct, medial to the area of acute infarct noted on MRI from 08/07/22.   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  Do not think she has had a new stroke Etiology:  small vessel disease Code Stroke CT head CTH was negative for a large hypodensity concerning for a large territory infarct or hyperdensity concerning for an ICH  MRI  Acute infarct along the left anteromedial aspect of the medulla, medial to the area of acute infarct noted on 08/07/2022 MRI.  LDL 100 HgbA1c 7.8 VTE prophylaxis - SCDs    Diet   DIET DYS 2 Room service appropriate? Yes; Fluid consistency: Nectar Thick   aspirin 81 mg daily and clopidogrel 75 mg daily - documented missed doses, now on aspirin 81 mg daily and clopidogrel 75 mg daily.x   Therapy recommendations:  CIR Disposition:  CIR  Diabetes HgbA1c 7.8 goal < 7.0 Uncontrolled CBG monitoring SSI DM education and close PCP follow up   Hypertension Stable on the high end Gradually normalize BP in 2-3 days Long term BP goal normotensive    Hyperlipidemia Home meds:  zetia and fenofibrate  LDL 100, goal < 70 Now on zetia and fenofibrate home meds No statin given hx of statin intolerance    Hx of stroke stroke in 02/2018 with left sided weakness and numbness. MRI showed right frontal and insular cortex scattered infarcts, concerning for embolic source. MRA showed right M2, A3 severe stenosis. CUS neg, EF 60-65% and LDL 141, A1C 7.4. put on DAPT on discharge.    Other Stroke Risk Factors Advanced age   Other Active Problems Recent UTI, this admission UA neg CKD IIIb, Cre 1.63  Hospital day # 8  Patient seen and examined by NP/APP with MD. MD to update note as needed.   Janine Ores, DNP, FNP-BC [facility] Pager: [phone]  STROKE MD NOTE :  I have personally obtained history,examined this patient, reviewed notes, independently viewed imaging studies, participated in medical decision making and plan of care.ROS completed by me personally and pertinent positives fully documented  I have made any additions or clarifications directly to the above note.  Agree with note above.  Patient with a recent left medullary infarct about 2 weeks ago was not doing well on inpatient rehab with gradual decline and daughter was reluctant to continue aspirin and Plavix as she felt medicines were to blame.  MRI scan shows evolution of the recent left medullary infarct and no definite new infarct.  Recommend continue aspirin and Plavix and patient and daughter counseled to be compliant with it.  Maintain good hydration.  Continue ongoing therapy and rehab.  No further stroke workup is necessary.  Long discussion with patient and daughter and answered questions.  Stroke team will sign off.  Kindly call for questions.  Greater than 50% time during this 50-minute visit was spent in counseling and coordination of care about her recent brainstem stroke and discussion about evaluation and treatment and answering questions.  Antony Contras,  MD Medical Director [facility] Pager: [phone] 08/21/2022 3:03 PM   To contact Stroke Continuity provider, please refer to [email or web address] After hours, contact General Neurology

## 2022-08-21 NOTE — Progress Notes (Signed)
Physical Therapy Session Note  Patient Details  Name: [name] MRN: [id number] Date of Birth: Oct 17, 1945  Today's Date: 08/21/2022 PT Individual Time: 0800-0830 PT Individual Time Calculation (min): 30 min   Short Term Goals: Week 1:  PT Short Term Goal 1 (Week 1): Pt will complete bed mobility with modA PT Short Term Goal 2 (Week 1): Pt will complete bed<>chair transfers with modA and LRAD PT Short Term Goal 3 (Week 1): Pt will ambulate 63f with maxA and LRAD  Skilled Therapeutic Interventions/Progress Updates:      Therapy Documentation Precautions:  Precautions Precautions: Fall Precaution Comments: premorbid L hemi Required Braces or Orthoses: Other Brace Other Brace: R ankle stabilizing brace from prior ankle fx Restrictions Weight Bearing Restrictions: Yes RLE Weight Bearing: Weight bearing as tolerated LLE Weight Bearing: Non weight bearing Other Position/Activity Restrictions: Old L sided weakness from a prior CVA from ~3 years ago. History of R abkle fracture ~1-1.5 yr ago  Pt received seated in TIS w/c at bedside with daughter present. Pt spilled beverage on herself during breakfast and requested to change clothes. Pt declines pain.   Pt requires min A for upper body dressing in TIS and max A with squat pivot transfer from TIS to EOB. Pt performed 2 sit to stand transfers with PT max A while patient's daughter performed total A for lower body dressing.   Pt requires min A for static and dynamic sitting balance EOB with mod visual, verbal and tactile cues to weight shift to left side. Pt visually tracks object to left side and with mod cues able to fixate gaze to the left.   Pt requires min A for dynamic sitting balance while performing ipsilateral and contralateral reaching outside base of support with mod tactile cues to encourage upright posture and cervical extension.   PT educated pt and family on flexibility exercises for upper trap.  Pt max A with squat  pivot to return to TIS and left at bedside with daughter present and all needs within reach.    Therapy/Group: Individual Therapy  SVerl DickerSVerl DickerPT, DPT  08/21/2022, 7:35 AM

## 2022-08-21 NOTE — Progress Notes (Signed)
Physical Therapy Session Note  Patient Details  Name: [name] MRN: [id number] Date of Birth: 03/07/1946  Today's Date: 08/21/2022 PT Individual Time: 1130-1200 + 1445-1526 PT Individual Time Calculation (min): 30 min + 41 MIN  Short Term Goals: Week 1:  PT Short Term Goal 1 (Week 1): Pt will complete bed mobility with modA PT Short Term Goal 2 (Week 1): Pt will complete bed<>chair transfers with modA and LRAD PT Short Term Goal 3 (Week 1): Pt will ambulate 86f with maxA and LRAD  Skilled Therapeutic Interventions/Progress Updates:      1st session: Pt reclined in TIS w/c to start - daughter at bedside. Patient in agreement to therapy session, no reports of pain.  Transported in w/c to day room rehab gym. Completed squat>pivot transfer with maxA towards her R side on mat table, improved initiation and BLE engagement during transfer. Able to sit EOM for >15 minutes with close SBA to intermittent CGA during activities. Dynamic sitting balance tasks - seated foot taps to 3inch block, shooting basketball to lowered rim, and reaching for ball outside BOS - improved midline and sitting balance compared to prior sessions. Worked on repeated sit<>stands from mat table, 3x5 with rest breaks b/w sets. PT blocking BLE using foam cushion to support her knees - improved initiation during task and ability to achieve semi-upright. Then we were able to work on stand<>pivot transfers, x4 reps, with mod/maxA overall. Pt reclined in TIS w/c - returned to her room, family at bedside and all needs met.   2nd session: Pt sitting in TIS w/c to start - reports fatigue from being up all day - patient requesting to return to bed at end of session to rest.  Transported in w/c to main rehab gym. Setup outside // bars using large mirror for visual feedback. Patient assisted in x6 sit<>stands by pulling to stand, ranging from mod to maxA overall. Standing tolerance ~30-45 seconds per stand, working on lateral  weight shifting L<>R and postural control/awareness to promote upright trunk/hip/cervical extension.   Transported to day room rehab gym where Valentines Day activities were being hosted by [employer] claims handler- completed at w/c level for safety using table top for activities. Assisted her with crafting, difficulty with FMC/dexterity for opening small containers/crafts. Patient's writing in letter is mostly unintelligible - used built up pen to aid in grasp. Worked on stretching for her LUE while she completed these tasks - primarily elbow extensors, shldr abductors, and shldr external rotators.   Patient returned to her room and assisted to bed via maxA squat<>pivot transfer. MaxA for sit>supine and daughter [facility] All needs met at end of session, daughter updated on pt's mobility.       Therapy Documentation Precautions:  Precautions Precautions: Fall Precaution Comments: premorbid L hemi Required Braces or Orthoses: Other Brace Other Brace: R ankle stabilizing brace from prior ankle fx Restrictions Weight Bearing Restrictions: Yes RLE Weight Bearing: Weight bearing as tolerated LLE Weight Bearing: Non weight bearing Other Position/Activity Restrictions: Old L sided weakness from a prior CVA from ~3 years ago. History of R abkle fracture ~1-1.5 yr ago General:     Therapy/Group: Individual Therapy  Zaylan Kissoon P Sidra Oldfield PT 08/21/2022, 7:39 AM

## 2022-08-21 NOTE — Progress Notes (Signed)
Daughter would like Zetia held until she can speak to neurologist regarding contradictory information on whether to prescribe or not.

## 2022-08-22 ENCOUNTER — Ambulatory Visit: Payer: Medicare PPO | Admitting: Physical Therapy

## 2022-08-22 LAB — HOMOCYSTEINE: Homocysteine: 17.9 umol/L (ref 0.0–19.2)

## 2022-08-22 LAB — GLUCOSE, CAPILLARY
Glucose-Capillary: 134 mg/dL — ABNORMAL HIGH (ref 70–99)
Glucose-Capillary: 128 mg/dL — ABNORMAL HIGH (ref 70–99)
Glucose-Capillary: 190 mg/dL — ABNORMAL HIGH (ref 70–99)
Glucose-Capillary: 131 mg/dL — ABNORMAL HIGH (ref 70–99)

## 2022-08-22 MED ORDER — EZETIMIBE 10 MG PO TABS: 10.0000 mg | ORAL_TABLET | Freq: Every day | ORAL | Status: DC

## 2022-08-22 MED ORDER — HYDRALAZINE HCL 25 MG PO TABS
25.0000 mg | ORAL_TABLET | Freq: Three times a day (TID) | ORAL | Status: DC | PRN
  Administered 2022-08-23 – 2022-08-28 (×2): 25 mg via ORAL
  Filled 2022-08-22 (×2): qty 1

## 2022-08-22 MED ORDER — SODIUM CHLORIDE 0.9 % IV SOLN: Freq: Once | INTRAVENOUS | Status: AC

## 2022-08-22 NOTE — Progress Notes (Signed)
Occupational Therapy Session Note  Patient Details  Name: [name] MRN: [id number] Date of Birth: [birth year]  Today's Date: 08/22/2022 OT Individual Time: 1400-1500 OT Individual Time Calculation (min): 60 min    Short Term Goals: Week 1:  OT Short Term Goal 1 (Week 1): Pt will transfer to [facility] with max A +1 consistently OT Short Term Goal 1 - Progress (Week 1): Progressing toward goal OT Short Term Goal 2 (Week 1): Pt will don shirt with min A OT Short Term Goal 2 - Progress (Week 1): Met OT Short Term Goal 3 (Week 1): Pt will sit unsupported during functional task with supervision OT Short Term Goal 3 - Progress (Week 1): Met OT Short Term Goal 4 (Week 1): Pt will perform sit to stand with max A consistently for clothing management with LRAD OT Short Term Goal 4 - Progress (Week 1): Progressing toward goal Week 2:  OT Short Term Goal 1 (Week 2): Pt will transfer to [facility] wiht +1 A and LRAD OT Short Term Goal 2 (Week 2): Pt will don shirt with S OT Short Term Goal 3 (Week 2): Pt will bridge hips in bed with MIN A to improve BOC wiht clothing managment OT Short Term Goal 4 (Week 2): pt will thread 1LE into pants wiht AE PRN  Skilled Therapeutic Interventions/Progress Updates:    1:1 Pt received in the w/c. Pt choose to participate in self care retraining at shower level. Pt used the STEDY to transition into the shower to a BSC. Pt required max A  +2 for sit to a modified stand (unable to come into full stand). Pt with significant decr postural control requiring constant multimodal cues and min to mod A to maintain sitting balance at midline. Pt able to bathe UB and thighs with A for sitting balance. Total A for LB and periarea/ buttocks. Pt then performed transfer with STEDY out of shower to the EOB. Again pt required min to mod A with constant cues for sitting balance to don hospital gown.  Pt laid back down with max A with A for bilateral LEs and trunk support. Pillows used for trunk  support at midline in upright sitting. Left resting in bed with call bell.   Therapy Documentation Precautions:  Precautions Precautions: Fall Precaution Comments: premorbid L hemi Required Braces or Orthoses: Other Brace Other Brace: R ankle stabilizing brace from prior ankle fx Restrictions Weight Bearing Restrictions: Yes RLE Weight Bearing: Weight bearing as tolerated LLE Weight Bearing: Non weight bearing Other Position/Activity Restrictions: Old L sided weakness from a prior CVA from ~3 years ago. History of R abkle fracture ~1-1.5 yr ago  Pain:     Therapy/Group: Individual Therapy  Willeen Cass [facility] 08/22/2022, 3:55 PM

## 2022-08-22 NOTE — Plan of Care (Signed)
Pt's plan of care adjusted to 15/7 after speaking with care team and discussed with MD in team conference as pt currently unable to tolerate current therapy schedule with OT, PT, and SLP.   

## 2022-08-22 NOTE — Progress Notes (Signed)
PROGRESS NOTE   Subjective/Complaints: No new complaints this morning Tolerated therapy well today Daughter would prefer longer length of stay if patient continues to make progress   ROS: +lethargy as per daughter-improved today, cognition intact as per team, denies pain, CP, SOB, abd pain, right sided lean improved. +fatigue- improved   Objective:   DG Chest 2 View  Result Date: 08/20/2022 CLINICAL DATA:  Cough EXAM: CHEST - 2 VIEW COMPARISON:  Chest x-ray 08/07/2022 FINDINGS: The heart is mildly enlarged, unchanged. There central pulmonary vascular congestion. There is no focal lung infiltrate, pleural effusion or pneumothorax. No acute fractures are seen. IMPRESSION: Mild cardiomegaly with central pulmonary vascular congestion. Electronically Signed   By: Ronney Asters M.D.   On: 08/20/2022 20:43   [name] CONTRAST  Result Date: 08/20/2022 CLINICAL DATA:  Stroke suspected EXAM: MRI HEAD WITHOUT CONTRAST TECHNIQUE: Multiplanar, multiecho pulse sequences of the brain and surrounding structures were obtained without intravenous contrast. COMPARISON:  08/07/2022 MRI head, correlation is also made with CT head 08/19/2022 FINDINGS: Brain: Restricted diffusion with ADC correlate along the left anteromedial aspect of the medulla (series 2, image 12 and series 1050, image 12). This is medial to the area of acute infarct noted on 08/07/2022. No acute hemorrhage, mass, mass effect, or midline shift. No hydrocephalus or extra-axial collection. Redemonstrated chronic right parietal infarct and right greater than left cerebellar infarcts, as well as bilateral basal ganglia/corona radiata lacunar infarcts. Advanced cerebral volume loss for age. Confluent T2 hyperintense signal in the periventricular white matter and pons, likely the sequela of moderate to severe chronic small vessel ischemic disease. Vascular: Normal arterial flow voids. Skull and  upper cervical spine: Normal marrow signal. Sinuses/Orbits: Clear paranasal sinuses. Status post bilateral lens replacements. Other: The mastoids are well aerated. IMPRESSION: Acute infarct along the left anteromedial aspect of the medulla, medial to the area of acute infarct noted on 08/07/2022 MRI. These results will be called to the ordering clinician or representative by the Radiologist Assistant, and communication documented in the PACS or [employer]. Electronically Signed   By: Merilyn Baba M.D.   On: 08/20/2022 19:55   Recent Labs    08/19/22 1825 08/21/22 1121  WBC 7.3 7.3  HGB 12.8 13.1  HCT 39.9 39.8  PLT 245 273    Recent Labs    08/20/22 0545 08/21/22 1048  NA  --  135  K  --  4.1  CL  --  107  CO2  --  18*  GLUCOSE  --  155*  BUN  --  21  CREATININE 1.66* 1.80*  CALCIUM  --  8.7*    Intake/Output Summary (Last 24 hours) at 08/22/2022 1751 Last data filed at 08/22/2022 1415 Gross per 24 hour  Intake 380 ml  Output 200 ml  Net 180 ml        Physical Exam: Vital Signs Blood pressure (!) 169/56, pulse 66, temperature 97.7 F (36.5 C), temperature source Oral, resp. rate 16, height 5' 2"$  (1.575 m), weight 64.7 kg, SpO2 100 %. Constitutional:      Comments: Frail appearing; awake, somewhat alert, but very little speech; Sitting up in bed; daughter at  bedside, NAD, BMI 26.09 HENT:     Head: Normocephalic and atraumatic.     Comments: L facial droop- from prior stroke Tongue midline    Right Ear: External ear normal.     Left Ear: External ear normal.     Nose: Nose normal. No congestion.     Mouth/Throat:     Mouth: Mucous membranes are dry.     Pharynx: Oropharynx is clear. No oropharyngeal exudate.  Eyes:     General:        Right eye: No discharge.        Left eye: No discharge.     Extraocular Movements: Extraocular movements intact.     Comments: Nystagmus to L>R  Cardiovascular:     Rate and Rhythm: Normal rate and regular rhythm.     Heart  sounds: Normal heart sounds. No murmur heard.    No gallop.  Pulmonary:     Effort: No respiratory distress.     Breath sounds: Normal breath sounds. No wheezing, rhonchi or rales. Good air movement. Abdominal:     General:normoactive BS. There is no distension.     Palpations: Abdomen is soft.     Tenderness: There is no abdominal tenderness.  Musculoskeletal:     Cervical back: Neck supple. No tenderness.     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  Speech is mildly dysarthric but intelligible.  Follows simple commands.  Provides name and age. Finger to nose on R side ok, but cannot test L Denies dizziness  Psychiatric:     Comments: Flat, decreased interaction  GU: voiding on own! Total A for peri hygiene Skin: IV in place  Assessment/Plan: 1. Functional deficits which require 3+ hours per day of interdisciplinary therapy in a comprehensive inpatient rehab setting. Physiatrist is providing close team supervision and 24 hour management of active medical problems listed below. Physiatrist and rehab team continue to assess barriers to discharge/monitor patient progress toward functional and medical goals  Care Tool:  Bathing    Body parts bathed by patient: Left arm, Chest, Abdomen, Right upper leg, Left upper leg, Face   Body parts bathed by helper: Right arm, Front perineal area, Buttocks, Right lower leg, Left lower leg     Bathing assist Assist Level: Maximal Assistance - Patient 24 - 49%     Upper Body Dressing/Undressing Upper body dressing   What is the patient wearing?: Pull over shirt    Upper body assist Assist Level: Moderate Assistance - Patient  50 - 74%    Lower Body Dressing/Undressing Lower body dressing      What is the patient wearing?: Underwear/pull up, Pants     Lower body assist Assist for lower body dressing: Total Assistance - Patient < 25%     Toileting Toileting    Toileting assist Assist for toileting: 2 Helpers     Transfers Chair/bed transfer  Transfers assist     Chair/bed transfer assist level: Total Assistance - Patient < 25%     Locomotion Ambulation   Ambulation assist   Ambulation activity did not occur: Safety/medical concerns  Walk 10 feet activity   Assist  Walk 10 feet activity did not occur: Safety/medical concerns        Walk 50 feet activity   Assist Walk 50 feet with 2 turns activity did not occur: Safety/medical concerns         Walk 150 feet activity   Assist Walk 150 feet activity did not occur: Safety/medical concerns         Walk 10 feet on uneven surface  activity   Assist Walk 10 feet on uneven surfaces activity did not occur: Safety/medical concerns         Wheelchair     Assist Is the patient using a wheelchair?: Yes Type of Wheelchair: Manual    Wheelchair assist level: Maximal Assistance - Patient 25 - 49% Max wheelchair distance: 77f    Wheelchair 50 feet with 2 turns activity    Assist        Assist Level: Total Assistance - Patient < 25%   Wheelchair 150 feet activity     Assist      Assist Level: Total Assistance - Patient < 25%   Blood pressure (!) 169/56, pulse 66, temperature 97.7 F (36.5 C), temperature source Oral, resp. rate 16, height 5' 2"$  (1.575 m), weight 64.7 kg, SpO2 100 %.  Medical Problem List and Plan: 1. 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  Impaired mobility: daughter notes mom does not tolerate lovenox, d/ced, SCDs ordered.            continue Aspirin 81 mg daily, plavix for 3 weeks.  3. Pain: N/A. Oxycodone discontinued. 4. Insomnia: resolved. Trazodone and zoloft d/ced 5. Neuropsych/cognition: This patient is? capable of making decisions on her own behalf. 6. Skin/Wound Care: Routine skin checks  -Protective cream to b/l heels 7. Fluids/Electrolytes/Nutrition: Routine in and outs with follow-up chemistries 8.  Dysphagia.  Dysphagia #3 nectar thick liquids.  Follow-up speech therapy  -2/10 on dys 2 nectar  9.  Hypertension.  Hydralazine 25 mg every 8 hours.  Losartan recently held due to AKI. Daughter prefers slow reduction of BP- at baseline she runs [id number]  [id number] changed to PRN, losartan 238mrestarted, daughter reports she had allergy to multiple other BP medications, she will bring a list later time  2/11 She declined bp meds today, BP stable in 160s 10.  Acute urinary retention.  d/c flomax 11.  AKI on CKD stage III.  Baseline creatinine 1.9-2.0.  Cr reviewed from 2/13 and is better than baseline 12.  Diabetes mellitus.  Hemoglobin A1c 7.8.  SSI.  Previously patient had declined initiation of any diabetic medications per documentation.  Will follow-up with family. Placed order for no juice.  13.  History of right ankle fracture.  Patient does wear an ankle stabilizing brace due to previous ankle fx. . 14.  Hyperlipidemia.  Zetia/fenofibrate. 15.  Constipation.  Miralax d/ced as per daughter's preference. Senokot 2 tabs at bedtime as needed.  Sorbitol caused side effects.   -  2/10 reports improved after a few BM  2/11 LBM today 16. Spasticity with forming contracture LUE as wlel as L PF contracture- will order PRAFO at  night as well as suggest Botox for LUE- has decent LUE strength. Kpad ordered. Discuss oral spasticity medications 17. Suboptimal vitamin D: continue ergocalciferol 50,000U once per week for 7 weeks, discussed that this can help with fatigue 18. Poor PO fluid intake  -IVF overnight today 44m/hr 19. Fluctuating strength: MRI brain and EEG ordered 20. Fatigue: discussed amantadine and modafinil 21. HTN: increase losartan to 259mID 22. New stroke: continue aspirin/plavix 21 days from 2/13      LOS: 9 days A FACE TO FACE EVALUATION WAS PERFORMED  KrClide Deutscheraulkar 08/22/2022, 5:51 PM

## 2022-08-22 NOTE — Patient Care Conference (Signed)
Inpatient RehabilitationTeam Conference and Plan of Care Update Date: 08/21/2022   Time: 11:06 AM    Patient Name: [name]      Medical Record Number: [id number]  Date of Birth: 1945/08/09 Sex: Female         Room/Bed: 4W19C/4W19C-01 Payor Info: Payor: [employer] MEDICARE / Plan: [employer] MEDICARE CHOICE PPO / Product Type: *No Product type* /    Admit Date/Time:  08/13/2022  1:48 PM  Primary Diagnosis:  Lateral medullary syndrome  Hospital Problems: Principal Problem:   Lateral medullary syndrome Active Problems:   Left hemiparesis (HCC)   Spasticity as late effect of cerebrovascular accident (CVA)    Expected Discharge Date: Expected Discharge Date: 08/28/22  Team Members Present: Physician leading conference: Dr. Leeroy Cha Social Worker Present: Ovidio Kin, LCSW Nurse Present: Dorien Chihuahua, RN PT Present: Ginnie Smart, PT OT Present: Other (comment) Lowella Fairy, OT) SLP Present: Helaine Chess, SLP PPS Coordinator present : Gunnar Fusi, SLP     Current Status/Progress Goal Weekly Team Focus  Bowel/Bladder   Has been incontinent of b/b for several days, Able to let staff know when awake if she requires a brief change.   Able to alert staff to her need to be toileted.   Toilet q2hrs while awake per order.    Swallow/Nutrition/ Hydration   D2 and NTL with Mod-Max A for adherence to aspiration precautions   Min A  Diet tolerance and  thin liquid trials via tsp    ADL's   Pt has had waxing and waning alertness/arousal with continued concerns of CVA evolvement which is under review. Overall pt remains MIN-S for sitting balance, MIN A to don shirt at EOB, total A to don pants and MAX-total A to sit to stand in the stedy with poor posture d/t decreased strength and deconditioning. Pt remains motivated and daughter is very supportive, however the burden of care is still high. Initiated SB transfer training to decrease BOC and improve pt initiation with mobility  tasks.   mod A overall   functional transfer training, ADL retraining, sitting balance, family education, NMR, activity tolerance    Mobility   Limited functional progress this reporting period. Continues to require max/totalA for bed mobility, mod/max for squat<>pivots, max/total for sit<>stands. R lean and ?pushing while sitting EOB. Performance fluctuates daily and time of the day. May benefit from being 15/7. VERY tight/contracted L ankle and hip adductors   modA at wheelchair level  Bed mobility, functional transfers, sitting balance, posture, stretching, ongoing caregiver training/ed.    Communication   Min-Mod A   Min A   speech intelligibility strategies    Safety/Cognition/ Behavioral Observations  Max A   Mod A for emergent awareness   emergent awareness    Pain   No c/o of pain at this time   Pain <3/10   Assess Qshift and prn    Skin   scabbed skin tear to LUE. Blanchable redness to bilateral heels, protective cream in orders.   Maintain skin integrity without any new breakage.  Assess qshift and prn      Discharge Planning:  medical issues last week and start of this week-home with daughter who was providing assistance-24/7 prior to admission. Here daily and very committed to Mom   Team Discussion: Patient with new CVA; post medullary stroke, MD added ASA and Plavix x 3 weeks and adjusted BP meds. Note poor postural control with little functional gains and remains a high burden of care.  Sitting  balance fluctuates, tolerance is limited, and has delayed swallowing, coughing spells noted.  Patient on target to meet rehab goals: No,currently needs min assist for upper body care and max - total assist for lower body care due to left calf tightness,abductor tightness and UE tone. Needs max assist for sit - stand in the [place] or standing frame.  Squat pivots with total - max assist. Maintained on a D2- Nectar diet with mod - max cues for aspiration precaution  adherence. Goals for discharge set for Mod assist overall.  *See Care Plan and progress notes for long and short-term goals.   Revisions to Treatment Plan:  Slide board trials Thin liquid trials   Teaching Needs: Safety, medications, dietary modifications, transfers, toileting, etc.  Current Barriers to Discharge: Decreased caregiver support and Home enviroment access/layout  Possible Resolutions to Barriers: Family education HH follow up services     Medical Summary Current Status: overweight, type 2 diabetes, chronic kidney disease, new stroke, HLD, cough  Barriers to Discharge: Medical stability  Barriers to Discharge Comments: overweight, type 2 diabetes, chronic kidney disease, new stroke, HLD, cough Possible Resolutions to [employer] Focus: provided dietary education, CBG checks AC/HS ordered, IVF started at night, neurology consulted, zetia started, plavix restarted, CXR obtained and reviewed with daughter   Continued Need for Acute Rehabilitation Level of Care: The patient requires daily medical management by a physician with specialized training in physical medicine and rehabilitation for the following reasons: Direction of a multidisciplinary physical rehabilitation program to maximize functional independence : Yes Medical management of patient stability for increased activity during participation in an intensive rehabilitation regime.: Yes Analysis of laboratory values and/or radiology reports with any subsequent need for medication adjustment and/or medical intervention. : Yes   I attest that I was present, lead the team conference, and concur with the assessment and plan of the team.   Dorien Chihuahua B 08/22/2022, 8:53 AM

## 2022-08-22 NOTE — Progress Notes (Signed)
Physical Therapy Session Note  Patient Details  Name: [name] MRN: [id number] Date of Birth: [birth year]  Today's Date: 08/22/2022 PT Individual Time: [id number] + 1300-1345 PT Individual Time Calculation (min): 45 min  + 45 min  Short Term Goals: Week 1:  PT Short Term Goal 1 (Week 1): Pt will complete bed mobility with modA PT Short Term Goal 2 (Week 1): Pt will complete bed<>chair transfers with modA and LRAD PT Short Term Goal 3 (Week 1): Pt will ambulate 9f with maxA and LRAD  Skilled Therapeutic Interventions/Progress Updates:      1st session: Pt in bed to start - incontinent of bladder and bowel, linen soaked. NT called in for +2 assist for pericare and brief change - completed at bed level. Donned pants, R ankle brace, and tennis shoes/socks with dependent assist for time.   Supine<>sitting EOB with maxA, primarily for trunk support to upright. Completed squat<>pivot transfer with mod/maxA from EOB to w/c, improved initiation and forward weight shifting.   Transported to main rehab gym for time and assisted to mat table in similar manner. Used mirror for visual feedback for sitting balance/posture. Worked on reaching across midline and forward reaching outside BOS by hanging resistive clothespins on TB around mirror - encouraged reaching upright for thoracic elongation and cervical extension. She tends to rest with her head flexed and chin tucked into her chest.   Completed 2x5 sit<>stands from raised EOM with mod/maxA overall - requires ++ time for initiation and assist for weight shifting to her L side, continues to lack full hip/trunk extension in standing.  Returned to her w/c and back to her room - concluded session reclined in TIS - daughter at bedside. All needs met.   2nd session: Pt sitting in TIS w/c to start - in agreement to therapy session and reports no pain. Transported in w/c to main rehab gym. Setup in Standing Frame to facilitate upright standing,  offloading buttock, and promote hip flexor stretching. TotalA for sit<>stands in frame with assist for positioning arms to avoid awkward placement or catching on table top. Assist for forward weight shifting for each stand. Listened to music to brighten mood and set goals to stand for duration of song for each stand. Initially used mirror for visual feedback as she tends to favor her R side, and then progressed to standing beach ball taps to RBoston Scientificwith PT providing min guard - ball taps to promote cervical extension, reaching outside BOS, and selective attention in busy gym environment. Pt returned to her room - concluded session in w/c with all needs met and daughter at the bedside.     Therapy Documentation Precautions:  Precautions Precautions: Fall Precaution Comments: premorbid L hemi Required Braces or Orthoses: Other Brace Other Brace: R ankle stabilizing brace from prior ankle fx Restrictions Weight Bearing Restrictions: Yes RLE Weight Bearing: Weight bearing as tolerated LLE Weight Bearing: Non weight bearing Other Position/Activity Restrictions: Old L sided weakness from a prior CVA from ~3 years ago. History of R abkle fracture ~1-1.5 yr ago General:    Therapy/Group: Individual Therapy  Ivelis Norgard P Lundon Verdejo PT 08/22/2022, 7:42 AM

## 2022-08-22 NOTE — Plan of Care (Signed)
  Problem: RH BLADDER ELIMINATION Goal: RH STG MANAGE BLADDER WITH ASSISTANCE Description: STG Manage Bladder With toileting Assistance 08/22/2022 0927 by Ander Slade, RN Outcome: Not Progressing incontinence 08/22/2022 0926 by Ander Slade, RN Outcome: Not Progressing

## 2022-08-22 NOTE — Progress Notes (Signed)
Speech Language Pathology Daily Session Note  Patient Details  Name: [name] MRN: [id number] Date of Birth: May 21, 1946  Today's Date: 08/22/2022 SLP Individual Time: 0930-1015 SLP Individual Time Calculation (min): 45 min  Short Term Goals: Week 2: SLP Short Term Goal 1 (Week 2): STG's = LTG's due to ELOS  Skilled Therapeutic Interventions: Pt seen this date for skilled ST intervention targeting swallowing and speech intelligibility goals outlined in care plan. Pt received fatigued/lethargic and OOB in TIS; lethargy worsened as session progressed. RN providing medications whole in puree, as recommended previously with pt exhibiting coughing that appeared r/t airway compromise. Agreeable to intervention. Daughter present at onset of session and reports improvement in consumption and tolerance of pureed textures since downgrade yesterday (2/14).  Today's session with emphasis on diet tolerance monitoring and speech intelligibility training. Re: swallowing, pt observed to exhibit immediate cough when RN provided medications whole in puree (as outlined in recommendations). At this point, SLP recommended RN change medication administration to crushed in puree until further notice; all parties verbalized understanding. Pt accepted two small sips of nectar-thick liquid via cup with seemingly delayed swallow initiation and multiple swallows x 1 likely due to fatigue; therefore, trials were terminated.   Continue to recommend Dysphagia 1 textures and nectar-thick liquids until MBSS can be completed next date. Pt and daughter in agreement with proposed plan. Please hold PO if too lethargic.  Re: speech intelligibility, SLP facilitated functional communication and speech intelligibility strategy training/education with use during of strategies within the context of a structured functional phrase task. With Mod A verbal and model prompts, pt produced 3 to 5 word phrases + sentences with ~85%  intelligibility. Outside of known context, pt was ~50% intelligible at the phrase level. Discussed with pt use of single words vs longer utterances and use of gestures to convey immediate wants and needs in the setting of fatigue. Pt verbalized understanding, though demonstrated no evidence of learning and will need reinforcement and family education given limited to no generalization/carryover of compensatory techniques thus far. Provided positive reinforcement when speech intelligibility improved with use of strategies and repeated back to pt what was heard and understood by communication partner (SLP). Given ongoing fatigue, SLP took pt to [facility] for change in scenery and to increased stimulus to facilitate arousal with minimal change. Pt required Max to Total A to locate specific items seen outside.   Of note, pt missed 15 minutes of skilled ST intervention due to ongoing fatigue despite change in stimulus input and environment.   Pt returned to room, tilted back in TIS for pressure relief, and left OOB in w/c with all safety measures activated, call bell within reach, and all immediate needs met. Continue per current ST POC.  Pain No pain reported; NAD  Therapy/Group: Individual Therapy  Jakyra Kenealy A Wylodean Shimmel 08/22/2022, 10:28 AM

## 2022-08-22 NOTE — Progress Notes (Signed)
RN was called to room. Daughter requesting to cancel MBS scheduled for tomorrow. Will notify Speech therapy in am. Will report to incoming RN.

## 2022-08-23 ENCOUNTER — Inpatient Hospital Stay (HOSPITAL_COMMUNITY): Payer: Medicare PPO

## 2022-08-23 ENCOUNTER — Other Ambulatory Visit: Payer: Medicare PPO

## 2022-08-23 DIAGNOSIS — I69354 Hemiplegia and hemiparesis following cerebral infarction affecting left non-dominant side: Principal | ICD-10-CM

## 2022-08-23 DIAGNOSIS — Z515 Encounter for palliative care: Secondary | ICD-10-CM

## 2022-08-23 DIAGNOSIS — G464 Cerebellar stroke syndrome: Secondary | ICD-10-CM

## 2022-08-23 LAB — GLUCOSE, CAPILLARY
Glucose-Capillary: 112 mg/dL — ABNORMAL HIGH (ref 70–99)
Glucose-Capillary: 157 mg/dL — ABNORMAL HIGH (ref 70–99)
Glucose-Capillary: 160 mg/dL — ABNORMAL HIGH (ref 70–99)

## 2022-08-23 MED ORDER — ASPIRIN 81 MG PO CHEW
81.0000 mg | CHEWABLE_TABLET | Freq: Every day | ORAL | Status: DC
  Administered 2022-08-23 – 2022-08-27 (×5): 81 mg via ORAL
  Filled 2022-08-23 (×6): qty 1

## 2022-08-23 MED ORDER — SODIUM CHLORIDE 0.9 % IV SOLN: Freq: Once | INTRAVENOUS | Status: AC

## 2022-08-23 NOTE — Progress Notes (Signed)
Occupational Therapy Session Note  Patient Details  Name: [name] MRN: [id number] Date of Birth: 12-Feb-1946  Today's Date: 08/23/2022 OT Individual Time: 1100-1200 OT Individual Time Calculation (min): 60 min    Short Term Goals: Week 2:  OT Short Term Goal 1 (Week 2): Pt will transfer to [facility] wiht +1 A and LRAD OT Short Term Goal 2 (Week 2): Pt will don shirt with S OT Short Term Goal 3 (Week 2): Pt will bridge hips in bed with MIN A to improve BOC wiht clothing managment OT Short Term Goal 4 (Week 2): pt will thread 1LE into pants wiht AE PRN  Skilled Therapeutic Interventions/Progress Updates:     Pt received sitting up in TIS WC with DTR present in room. Pt presenting to be in good spirits and receptive to skilled OT session. Pt reporting 0/10 pain wearing RLE brace upon OT arrival. Spoke with DTR at beginning of session to discuss Pt lunch schedule and assistance- plan to carry over to nursing staff at end of session.   Pt transported total A to therapy gym for time management and energy conservation. Squat<>pivot with max A to EOM towards Pt right (stronger) side with anterior weight shifting initiated with mod A and tactile/VB cueing.   Pt noted to present with R lean sitting EOM with wedge placed under Pt R hip to facilitate increases midline orientation and L lateral weight shifting. Focused EOM activities on anterior/lateral weight shifting, trunk control, and increased midline orientation. Facilitated anterior weight shifting with Pt initially placing BUEs on OT's shoulders with OT positioned in front of Pt with visual and tactile cues provided to find midline. Pt presenting to dislike moving outside BOS with OT facilitating movement max A, however transitioning to min A with tactile cues with increased time. Task graded up with Pt hands placed on large yoga ball on floor with Pt initiating anterior weight shifting mod A.   Pt completed modified  crunches for core  strengthening and body awareness lowering trunk posteriorly onto large yoga ball and returning to midline 2x8 reps. Rest breaks provided between.   Pt completed bean bag actvity seated EOM to facilitate weight bearing through LUE, lateral elbow leans, trunk rotation, and inferior reaching. Pt able to reach inferiorly to retrieve bean bags with anterior weight shifting facilitation and place on wedge on R side of body reaching across midline. Pt completed task 8x2 trials utilizing RUE and then LUE for following trial. Activity reversed with Pt completing lateral lean reaching across body and inferiorly reaching to through bean bag into basket 8x1 trial R/L.   Mild improvement noted in initiating anterior weight shift for squat<>pivot back to wc max A. Transported back to room total A.   Spoke with nursing staff at end of session to request assistance with Pt lunch. Pt handed off to nursing staff at end of session and left resting in TIS wc with call bell in reach and all immediate needs met.   Therapy Documentation Precautions:  Precautions Precautions: Fall Precaution Comments: premorbid L hemi Required Braces or Orthoses: Other Brace Other Brace: R ankle stabilizing brace from prior ankle fx Restrictions Weight Bearing Restrictions: Yes RLE Weight Bearing: Weight bearing as tolerated LLE Weight Bearing: Non weight bearing Other Position/Activity Restrictions: Old L sided weakness from a prior CVA from ~3 years ago. History of R abkle fracture ~1-1.5 yr ago   Therapy/Group: Individual Therapy  Janey Genta 08/23/2022, 8:09 AM

## 2022-08-23 NOTE — Progress Notes (Signed)
PROGRESS NOTE   Subjective/Complaints: No new complaints this morning She did not enjoy doing the MBS this morning, discussed recommendations with SLP   ROS: +lethargy as per daughter-improved today, cognition intact as per team, denies pain, CP, SOB, abd pain, right sided lean improved. +fatigue- improved, insomnia improved   Objective:   No results found. Recent Labs    08/21/22 1121  WBC 7.3  HGB 13.1  HCT 39.8  PLT 273    Recent Labs    08/21/22 1048  NA 135  K 4.1  CL 107  CO2 18*  GLUCOSE 155*  BUN 21  CREATININE 1.80*  CALCIUM 8.7*    Intake/Output Summary (Last 24 hours) at 08/23/2022 1555 Last data filed at 08/23/2022 1257 Gross per 24 hour  Intake 544 ml  Output --  Net 544 ml        Physical Exam: Vital Signs Blood pressure (!) 159/58, pulse 66, temperature 97.7 F (36.5 C), temperature source Oral, resp. rate 16, height 5' 2"$  (1.575 m), weight 64.7 kg, SpO2 99 %. Constitutional:      Comments: Frail appearing; awake, somewhat alert, but very little speech; Sitting up in bed; daughter at bedside, NAD, BMI 26.09 HENT:     Head: Normocephalic and atraumatic.     Comments: L facial droop- from prior stroke Tongue midline    Right Ear: External ear normal.     Left Ear: External ear normal.     Nose: Nose normal. No congestion.     Mouth/Throat:     Mouth: Mucous membranes are dry.     Pharynx: Oropharynx is clear. No oropharyngeal exudate.  Eyes:     General:        Right eye: No discharge.        Left eye: No discharge.     Extraocular Movements: Extraocular movements intact.     Comments: Nystagmus to L>R  Cardiovascular:     Rate and Rhythm: Normal rate and regular rhythm.     Heart sounds: Normal heart sounds. No murmur heard.    No gallop.  Pulmonary:     Effort: No respiratory distress.     Breath sounds: Normal breath sounds. No wheezing, rhonchi or rales. Good air  movement. Abdominal:     General:normoactive BS. There is no distension.     Palpations: Abdomen is soft.     Tenderness: There is no abdominal tenderness.  Musculoskeletal:     Cervical back: Neck supple. No tenderness.     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  Speech is mildly dysarthric but intelligible.  Follows simple commands.  Provides name and age. Finger to nose on R side ok, but cannot test L Denies dizziness Squat pivots Max A  Psychiatric:     Comments: Flat, decreased interaction  GU: voiding on own!  Total A for peri hygiene Skin: IV in place  Assessment/Plan: 1. Functional deficits which require 3+ hours per day of interdisciplinary therapy in a comprehensive inpatient rehab setting. Physiatrist is providing close team supervision and 24 hour management of active medical problems listed below. Physiatrist and rehab team continue to assess barriers to discharge/monitor patient progress toward functional and medical goals  Care Tool:  Bathing    Body parts bathed by patient: Left arm, Chest, Abdomen, Right upper leg, Left upper leg, Face   Body parts bathed by helper: Right arm, Front perineal area, Buttocks, Right lower leg, Left lower leg     Bathing assist Assist Level: Maximal Assistance - Patient 24 - 49%     Upper Body Dressing/Undressing Upper body dressing   What is the patient wearing?: Pull over shirt    Upper body assist Assist Level: Moderate Assistance - Patient 50 - 74%    Lower Body Dressing/Undressing Lower body dressing      What is the patient wearing?: Underwear/pull up, Pants     Lower body assist Assist for lower  body dressing: Total Assistance - Patient < 25%     Toileting Toileting    Toileting assist Assist for toileting: 2 Helpers     Transfers Chair/bed transfer  Transfers assist     Chair/bed transfer assist level: Total Assistance - Patient < 25%     Locomotion Ambulation   Ambulation assist   Ambulation activity did not occur: Safety/medical concerns          Walk 10 feet activity   Assist  Walk 10 feet activity did not occur: Safety/medical concerns        Walk 50 feet activity   Assist Walk 50 feet with 2 turns activity did not occur: Safety/medical concerns         Walk 150 feet activity   Assist Walk 150 feet activity did not occur: Safety/medical concerns         Walk 10 feet on uneven surface  activity   Assist Walk 10 feet on uneven surfaces activity did not occur: Safety/medical concerns         Wheelchair     Assist Is the patient using a wheelchair?: Yes Type of Wheelchair: Manual    Wheelchair assist level: Maximal Assistance - Patient 25 - 49% Max wheelchair distance: 62f    Wheelchair 50 feet with 2 turns activity    Assist        Assist Level: Total Assistance - Patient < 25%   Wheelchair 150 feet activity     Assist      Assist Level: Total Assistance - Patient < 25%   Blood pressure (!) 159/58, pulse 66, temperature 97.7 F (36.5 C), temperature source Oral, resp. rate 16, height 5' 2"$  (1.575 m), weight 64.7 kg, SpO2 99 %.  Medical Problem List and Plan: 1. 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  Impaired mobility: daughter notes mom does not tolerate lovenox, d/ced, SCDs  ordered.            continue Aspirin 81 mg daily, plavix for 3 weeks.  3. Pain: N/A. Oxycodone discontinued. 4. Insomnia: resolved. Trazodone and zoloft d/ced 5. Neuropsych/cognition: This patient is? capable of making decisions on her own behalf. 6. Skin/Wound Care: Routine skin checks  -Protective cream to b/l heels 7. Fluids/Electrolytes/Nutrition: Routine in and outs with follow-up chemistries 8.  Dysphagia.  Dysphagia #3 nectar thick liquids.  Follow-up speech therapy  Downgraded to D1/nectar 9.  Hypertension.  Hydralazine 25 mg every 8 hours.  Losartan recently held due to AKI. Daughter prefers slow reduction of BP- at baseline she runs [id number] systolic   Hydralazine changed to PRN, losartan 65m restarted, increased to BID, daughter reports she had allergy to multiple other BP medications, she will bring a list later time  10.  Acute urinary retention.  d/c flomax. Resolved.  11.  AKI on CKD stage III.  Baseline creatinine 1.9-2.0.  Cr reviewed from 2/13 and is better than baseline, repeat on Monday.  12.  Diabetes mellitus.  Hemoglobin A1c 7.8.  SSI.  Previously patient had declined initiation of any diabetic medications per documentation.  Will follow-up with family. Placed order for no juice. Provided dietary education 13.  History of right ankle fracture.  Patient does wear an ankle stabilizing brace due to previous ankle fx. . 14.  Hyperlipidemia.  Zetia/fenofibrate. 15.  Constipation.  Miralax d/ced as per daughter's preference. Senokot 2 tabs at bedtime as needed.  Sorbitol caused side effects.   -2/10 reports improved after a few BM  2/11 LBM today 16. 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 Kpad ordered. Discuss oral spasticity medications 17. Suboptimal vitamin D: continue  ergocalciferol 50,000U once per week for 7 weeks, discussed that this can help with fatigue 18. Poor PO fluid intake  -IVF overnight today 58mhr 19. Fluctuating strength: MRI brain and EEG ordered 20. Fatigue: discussed amantadine and modafinil 21. HTN: increase losartan to 2553mD 22. New stroke: continue aspirin/plavix 21 days from 2/13      LOS: 10 days A FACE TO FACE EVALUATION WAS PERFORMED  KruClide Deutscherulkar 08/23/2022, 3:55 PM

## 2022-08-23 NOTE — Plan of Care (Signed)
  Problem: Consults Goal: RH STROKE PATIENT EDUCATION Description: See Patient Education module for education specifics  Outcome: Progressing   Problem: RH BOWEL ELIMINATION Goal: RH STG MANAGE BOWEL WITH ASSISTANCE Description: STG Manage Bowel with mod I Assistance. Outcome: Progressing Goal: RH STG MANAGE BOWEL W/MEDICATION W/ASSISTANCE Description: STG Manage Bowel with Medication with mod I Assistance. Outcome: Progressing   Problem: RH BLADDER ELIMINATION Goal: RH STG MANAGE BLADDER WITH ASSISTANCE Description: STG Manage Bladder With toileting Assistance Outcome: Progressing   Problem: RH SAFETY Goal: RH STG ADHERE TO SAFETY PRECAUTIONS W/ASSISTANCE/DEVICE Description: STG Adhere to Safety Precautions With cues Assistance/Device. Outcome: Progressing   Problem: RH KNOWLEDGE DEFICIT Goal: RH STG INCREASE KNOWLEDGE OF DIABETES Description: Patient and dtr will be able to manage DM with medications and dietary modifications using educational resources independently Outcome: Progressing Goal: RH STG INCREASE KNOWLEDGE OF HYPERTENSION Description: Patient and dtr will be able to manage HTN with medications and dietary modifications using educational resources independently Outcome: Progressing Goal: RH STG INCREASE KNOWLEDGE OF DYSPHAGIA/FLUID INTAKE Description: Patient and dtr will be able to manage Dysphagia, medications and dietary modifications using educational resources independently Outcome: Progressing Goal: RH STG INCREASE KNOWLEGDE OF HYPERLIPIDEMIA Description: Patient and dtr will be able to manage HLD with medications and dietary modifications using educational resources independently Outcome: Progressing Goal: RH STG INCREASE KNOWLEDGE OF STROKE PROPHYLAXIS Description: Patient and dtr will be able to manage secondary risks with medications and dietary modifications using educational resources independently Outcome: Progressing   Problem: Education: Goal:  Knowledge of disease or condition will improve Outcome: Progressing Goal: Knowledge of secondary prevention will improve (MUST DOCUMENT ALL) Outcome: Progressing Goal: Knowledge of patient specific risk factors will improve Elta [place] N/A or DELETE if not current risk factor) Outcome: Progressing   Problem: Ischemic Stroke/TIA Tissue Perfusion: Goal: Complications of ischemic stroke/TIA will be minimized Outcome: Progressing   Problem: Coping: Goal: Will verbalize positive feelings about self Outcome: Progressing Goal: Will identify appropriate support needs Outcome: Progressing   Problem: Health Behavior/Discharge Planning: Goal: Ability to manage health-related needs will improve Outcome: Progressing Goal: Goals will be collaboratively established with patient/family Outcome: Progressing   Problem: Self-Care: Goal: Ability to participate in self-care as condition permits will improve Outcome: Progressing Goal: Verbalization of feelings and concerns over difficulty with self-care will improve Outcome: Progressing Goal: Ability to communicate needs accurately will improve Outcome: Progressing   Problem: Nutrition: Goal: Risk of aspiration will decrease Outcome: Progressing Goal: Dietary intake will improve Outcome: Progressing

## 2022-08-23 NOTE — Progress Notes (Signed)
Physical Therapy Weekly Progress Note  Patient Details  Name: [name] MRN: [id number] Date of Birth: 04-26-1946  Beginning of progress report period: August 14, 2022 End of progress report period: August 23, 2022  Today's Date: 08/23/2022 PT Individual Time: [id number] + 1015-1030 + [id number] PT Individual Time Calculation (min): 40 min  + 30 min + 25 min  Patient has met 0 of 3 short term goals. Pt is making slower than anticipated progress towards LTG of modA at wheelchair level. Patient with setback during her first week of therapy with MRI showing new CVA in L anteromedial aspect of the medulla. Her therapy scheduled as been bumped to 15/7 to allow improved endurance throughout sessions. Overall, she's currently at a maxA level with squat<>pivot transfers. Patient is nonambulatory to date and anticipate she will be primarily at wheelchair level at DC. May need custom/speciality wheelchair evaluation but daughter wanting to wait until closer to DC.   Patient continues to demonstrate the following deficits muscle weakness and muscle joint tightness, decreased cardiorespiratoy endurance, abnormal tone, unbalanced muscle activation, and motor apraxia, decreased attention to left and decreased motor planning, decreased initiation, decreased attention, decreased awareness, decreased problem solving, decreased safety awareness, decreased memory, and delayed processing, and decreased sitting balance, decreased standing balance, decreased postural control, hemiplegia, and decreased balance strategies and therefore will continue to benefit from skilled PT intervention to increase functional independence with mobility.  Patient progressing toward long term goals..  Continue plan of care.  PT Short Term Goals Week 1:  PT Short Term Goal 1 (Week 1): Pt will complete bed mobility with modA PT Short Term Goal 1 - Progress (Week 1): Progressing toward goal PT Short Term Goal 2 (Week 1): Pt will  complete bed<>chair transfers with modA and LRAD PT Short Term Goal 2 - Progress (Week 1): Progressing toward goal PT Short Term Goal 3 (Week 1): Pt will ambulate 19f with maxA and LRAD PT Short Term Goal 3 - Progress (Week 1): Not met Week 2:  PT Short Term Goal 1 (Week 2): = LTG  Skilled Therapeutic Interventions/Progress Updates:       1st session: Pt sitting in TIS w/c to start - agreeable to PT tx and denies pain. Transported to day room rehab gym for time.  Squat<>pivot transfer with maxA to transfer towards her R side, delayed initiation and minimal forward weight shifting during transfer.   Worked on sitting balance at edge of mat - therapy ball roll out/in and across midline to promote L lateral weight shifting as she favors her R side. Core/trunk control strengthening/awareness at edge of mat with modified crunches and lateral elbow leans each direction, 2x10 for each. PT facilitating all directions as she dislikes moving outside her comfort zone or BOS. Needs cues for keeping her BLE tucked underneath her during these activities to ensure adequate BOS through her feet for sitting balance.   Returned to her TIS w/c with maxA with similar deficits as above. Allowed ++ time for initiation but minimal noted.   Returned to her room and assisted back to bed. MaxA for bed mobility. Patient having swallow test scheduled so patient returned to bed. All needs met, daughter updated at the bedside on pt's tolerance to therapy session.     2nd session: Pt in bed to start - daughter at bedside. Pt in agreement to therapy session with no reports of pain. Donned tennis shoes with totalA for time. MaxA needed for sit>supine with hospital bed features -  daughter reports adjustable base at home but may consider hospital bed as they get closer to DC date.   Squat<>pivot transfer with max/totalA to her R side to the w/c - transported to ortho rehab gym to practice car transfers. Patient required  maxA overall for squat<>pivot car transfers with car height simulating her daughter's smaller sedan. Assist for BLE to enter the car but no assist for BLE to exit. Continues to demonstrate delayed to insufficient initiation for tasks.   Instructed in w/c mobility using her feet to propel - able to propel short distance, ~81f, with supervision. Cues for increasing R stride length while propelling as it tends to get caught underneath her. TIS w/c tilted fully forward without concern for LOB anteriorly, although may benefit from seat belt or chest strap for TIS at home for safety.  Returned to her room - concluded session in TIS w/c, daughter at bedside who was updated.    3rd session: Pt in w/c to start - agreeable to therapy. No reports of pain. Transported to ortho rehab gym and assisted onto Nustep with mod/maxA squat<>pivot transfer - allowed time for ++ initiation and processing. SetupA needed due to weakness. Completed 12 minutes at L5 resistance, using BLE and RUE only (unable to use LUE due to tone). Cues for full ROM bilaterally. Assisted back to W/c with modA with improved initiation, squat<>pivot to her L side. Returned to her room and concluded session reclined in TIS, all needs met.       Therapy Documentation Precautions:  Precautions Precautions: Fall Precaution Comments: premorbid L hemi Required Braces or Orthoses: Other Brace Other Brace: R ankle stabilizing brace from prior ankle fx Restrictions Weight Bearing Restrictions: Yes RLE Weight Bearing: Weight bearing as tolerated LLE Weight Bearing: Non weight bearing Other Position/Activity Restrictions: Old L sided weakness from a prior CVA from ~3 years ago. History of R abkle fracture ~1-1.5 yr ago General:     Therapy/Group: Individual Therapy  Jesse Hirst P Sharonlee Nine PT 08/23/2022, 7:37 AM

## 2022-08-23 NOTE — Procedures (Signed)
Modified Barium Swallow Study  Patient Details  Name: [name] MRN: [id number] Date of Birth: 30-May-1946  Today's Date: 08/23/2022  Modified Barium Swallow completed.  Full report located under Chart Review in the Imaging Section.  History of Present Illness 77 yo female adm to [facility] with several days of lethargy - treated for UTI, readmitted after found to have drooling, left sided weakness and dysphagia.  Medical history significant of CVA w/ left side residuals, HTN, HLD, DM2, CKD4. 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   Pt passed [place], but noted to have ongoing symptoms of dysphagia.  Prior MOCA score 29/30 when pt had CVA 2019.  Follow up for dysphagia treatment indicated.   Clinical Impression MBSS completed to assess pt's tolerance for current diet textures given limited clinical utility of tolerance. At this time, pt presents with moderate oropharyngeal dysphagia c/b delayed oral transit resulting in premature spillage to vallecula and trace oral residuals post-swallow. Pharyngeally, pt exhibits decreased base of tongue approximation to posterior pharyngeal wall, diminished laryngeal elevation + closure, decreased epiglottic inversion, and diffuse trace to mild pharyngeal stasis.   Aforementioned findings resulted in deep penetration before the swallow and silent aspiration during the swallow with thin liquid via tsp. Cued cough was ineffective at clearing. Trace-transient penetration noted with honey-thick liquid via cup. No additional s/sx concerning for penetration nor aspiration observed. Appeared to tolerate nectar-thick liquid best when provided via tsp and small cup sips. Pt was noted to be impulsive with intake and requires hand-over-hand cues for safe intake/bolus size monitoring. Cued multiple swallows were not very effective in  clearing pharyngeal stasis due to pt's decreased ability to produce volitional throat clear in the absence of PO.  Given objective findings, recommend continuation of Dysphagia 1 consistencies with nectar-thick liquid via tsp only given 1:1 supervision for safety. Recommend medications be crushed and placed in puree. Additionally, recommend pt be OOB for all meals or sitting fully upright in bed, be prompted to hold head in neutral positioning during swallow, and avoid mixed consistencies (try to do all drink and then solids, and then finish with liquids). Results and recommendations reviewed with pt, pt's daughter, and MD; they verbalized understanding and agreement with plan. Will monitor for tolerance. Suspect pt will likely be discharged on this date. Per pt reports, she enjoys pureed textures and finds them easier to consume. May consider honey-thick liquids if pt is not tolerating nectar-thick liquids via tsp clinically.   Factors that may increase risk of adverse event in presence of aspiration ([place] 2021): Frail or deconditioned;Limited mobility;Dependence for feeding and/or oral hygiene;Weak cough;Reduced cognitive function  Swallow Evaluation Recommendations Recommendations: PO diet PO Diet Recommendation: Dysphagia 1 (Pureed);Mildly thick liquids (Level 2, nectar thick) Liquid Administration via: Spoon Medication Administration: Crushed with puree Supervision: Patient able to self-feed;Full supervision/cueing for swallowing strategies Swallowing strategies  : Minimize environmental distractions;Slow rate;Small bites/sips;Check for pocketing or oral holding;Avoid mixed consistencies Postural changes: Position pt fully upright for meals;Stay upright 30-60 min after meals;Out of bed for meals Oral care recommendations: Oral care BID (2x/day);Staff/trained caregiver to provide oral care;Use suctioning for oral care Caregiver Recommendations: Avoid jello, ice cream, thin soups,  popsicles;Have oral suction available;Remove water pitcher      Ellery Tash A Newell Wafer 08/23/2022,9:09 PM

## 2022-08-23 NOTE — Progress Notes (Signed)
PATIENT NAME: [name] DOB: [birth year] MRN: [id number]  PRIMARY CARE PROVIDER: Lauree Chandler, NP  RESPONSIBLE PARTY:  Acct ID - Guarantor Home Phone Work Phone Relationship Acct Type  [id number] - Schlemmer,BILL* [phone]  Self P/F     [address], [place], [place] [address]   Palliative Care Telephonic Follow Up Encounter Note    Spoke to Hester Mates (pt's dtr) to follow up on pt's status after the last telephone call on August 06, 2022. [name] reports pt is in [place] at this time. LPN unable to schedule a visit since pt has had a couple of medical events while in therapy. [name] is aware to call the Palliative Care Team when the pt has been discharged.    Nyzir Dubois Georgann Housekeeper, LPN

## 2022-08-24 ENCOUNTER — Inpatient Hospital Stay (HOSPITAL_COMMUNITY): Payer: Medicare PPO

## 2022-08-24 DIAGNOSIS — R1312 Dysphagia, oropharyngeal phase: Secondary | ICD-10-CM

## 2022-08-24 DIAGNOSIS — R41844 Frontal lobe and executive function deficit: Secondary | ICD-10-CM

## 2022-08-24 LAB — GLUCOSE, CAPILLARY
Glucose-Capillary: 134 mg/dL — ABNORMAL HIGH (ref 70–99)
Glucose-Capillary: 168 mg/dL — ABNORMAL HIGH (ref 70–99)
Glucose-Capillary: 139 mg/dL — ABNORMAL HIGH (ref 70–99)

## 2022-08-24 MED ORDER — SODIUM CHLORIDE 0.9 % IV SOLN: Freq: Once | INTRAVENOUS | Status: DC

## 2022-08-24 MED ORDER — SODIUM CHLORIDE 0.9 % IV SOLN: Freq: Once | INTRAVENOUS | Status: AC

## 2022-08-24 NOTE — Progress Notes (Signed)
SLP notified this nurse of new onset trouble swallowing and this nurse in room to evaluate patient family at bedside endorsed new worsening of facial droop and new trouble swallowing. Code Stroke called at 1420 awaiting orders and RRT to bedside

## 2022-08-24 NOTE — Progress Notes (Signed)
PROGRESS NOTE   Subjective/Complaints: No new complaints this morning Skin tear is stable, but still present, reviewed and manuka honey is being applied   ROS: +lethargy as per daughter-improved today, cognition intact as per team, denies pain, CP, SOB, abd pain, right sided lean improved. +fatigue- improved, insomnia improved, +LUE spasticity, +right hand skin tear   Objective:   DG Swallowing Func-Speech Pathology  Result Date: 08/23/2022 Table formatting from the original result was not included. Modified Barium Swallow Study Patient Details Name: [name] MRN: [id number] Date of Birth: 1946/05/02 Today's Date: 08/23/2022 HPI/PMH: HPI: 77 yo female adm to [facility] with several days of lethargy - treated for UTI, readmitted after found to have drooling, left sided weakness and dysphagia.  Medical history significant of CVA w/ left side residuals, HTN, HLD, DM2, CKD4. 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   Pt passed [place], but noted to have ongoing symptoms of dysphagia.  Prior MOCA score 29/30 when pt had CVA 2019.  Follow up for dysphagia treatment indicated. Clinical Impression: Clinical Impression: MBSS completed to assess pt's tolerance for current diet textures given limited clinical utility of tolerance. At this time, pt presents with moderate oropharyngeal dysphagia c/b delayed oral transit resulting in premature spillage to vallecula and trace oral residuals post-swallow. Pharyngeally, pt exhibits decreased base of tongue approximation to posterior pharyngeal wall, diminished laryngeal elevation + closure, decreased epiglottic inversion, and diffuse trace to mild pharyngeal stasis.  Aforementioned findings resulted in deep penetration before the swallow and silent aspiration during the swallow  with thin liquid via tsp. Cued cough was ineffective at clearing. Trace-transient penetration noted with honey-thick liquid via cup. No additional s/sx concerning for penetration nor aspiration observed. Appeared to tolerate nectar-thick liquid best when provided via tsp and small cup sips. Pt was noted to be impulsive with intake and requires hand-over-hand cues for safe intake/bolus size monitoring. Cued multiple swallows were not very effective in clearing pharyngeal stasis due to pt's decreased ability to produce volitional throat clear in the absence of PO.  Given objective findings, recommend continuation of Dysphagia 1 consistencies with nectar-thick liquid via tsp only given 1:1 supervision for safety. Recommend medications be crushed and placed in puree. Additionally, recommend pt be OOB for all meals or sitting fully upright in bed, be prompted to hold head in neutral positioning during swallow, and avoid mixed consistencies (try to do all drink and then solids, and then finish with liquids). Results and recommendations reviewed with pt, pt's daughter, and MD; they verbalized understanding and agreement with plan. Will monitor for tolerance. Suspect pt will likely be discharged on this date. Per pt reports, she enjoys pureed textures and finds them easier to consume. May consider honey-thick liquids if pt is not tolerating nectar-thick liquids via tsp clinically. Factors that may increase risk of adverse event in presence of aspiration ([place] 2021): Factors that may increase risk of adverse event in presence of aspiration ([place] 2021): Frail or deconditioned; Limited mobility; Dependence for feeding and/or oral hygiene; Weak cough; Reduced cognitive  function Recommendations/Plan: Swallowing Evaluation Recommendations Swallowing Evaluation Recommendations Recommendations: PO diet PO Diet Recommendation: Dysphagia 1 (Pureed); Mildly thick liquids (Level 2, nectar thick) Liquid  Administration via: Spoon Medication Administration: Crushed with puree Supervision: Patient able to self-feed; Full supervision/cueing for swallowing strategies Swallowing strategies  : Minimize environmental distractions; Slow rate; Small bites/sips; Check for pocketing or oral holding; Avoid mixed consistencies Postural changes: Position pt fully upright for meals; Stay upright 30-60 min after meals; Out of bed for meals Oral care recommendations: Oral care BID (2x/day); Staff/trained caregiver to provide oral care; Use suctioning for oral care Caregiver Recommendations: Avoid jello, ice cream, thin soups, popsicles; Have oral suction available; Remove water pitcher Treatment Plan Treatment Plan Treatment recommendations: Therapy as outlined in treatment plan below Follow-up recommendations: Home health SLP Functional status assessment: Patient has had a recent decline in their functional status and/or demonstrates limited ability to make significant improvements in function in a reasonable and predictable amount of time. Treatment frequency: Min 3x/week Treatment duration: 1 week Interventions: Patient/family education; Aspiration precaution training; Diet toleration management by SLP; Respiratory muscle strength training; Trials of upgraded texture/liquids; Oropharyngeal exercises Recommendations Recommendations for follow up therapy are one component of a multi-disciplinary discharge planning process, led by the attending physician.  Recommendations may be updated based on patient status, additional functional criteria and insurance authorization. Assessment: Orofacial Exam: Orofacial Exam Oral Cavity: Oral Hygiene: WFL Oral Cavity - Dentition: Adequate natural dentition Orofacial Anatomy: WFL Anatomy: Anatomy: WFL Thin Liquids: Thin Liquids (Level 0) Thin Liquids : Impaired Bolus delivery method: Spoon Thin Liquid - Impairment: Oral Impairment; Pharyngeal impairment Lip Closure: No labial escape Tongue  control during bolus hold: Posterior escape of less than half of bolus Bolus transport/lingual motion: Slow tongue motion Oral residue: Trace residue lining oral structures Location of oral residue : Tongue Initiation of swallow : Pyriform sinuses Soft palate elevation: No bolus between soft palate (SP)/pharyngeal wall (PW) Laryngeal elevation: Partial superior movement of thyroid cartilage/partial approximation of arytenoids to epiglottic petiole Anterior hyoid excursion: Complete Epiglottic movement: Partial Laryngeal vestibule closure: Incomplete, narrow column air/contrast in laryngeal vestibule Pharyngeal stripping wave : Present - complete Pharyngeal contraction (A/P view only): N/A Pharyngoesophageal segment opening: Complete distension and complete duration, no obstruction of flow Tongue base retraction: Trace column of contrast or air between tongue base and PPW Pharyngeal residue: Trace residue within or on pharyngeal structures Location of pharyngeal residue: Diffuse (>3 areas) Penetration/Aspiration Scale (PAS) score: 8.  Material enters airway, passes BELOW cords without attempt by patient to eject out (silent aspiration)  Mildly Thick Liquids: Mildly thick liquids (Level 2, nectar thick) Mildly thick liquids (Level 2, nectar thick): Impaired Bolus delivery method: Spoon; Cup Mildly Thick Liquid - Impairment: Oral Impairment; Pharyngeal impairment Lip Closure: No labial escape Tongue control during bolus hold: Posterior escape of less than half of bolus Bolus transport/lingual motion: Slow tongue motion Oral residue: Trace residue lining oral structures Location of oral residue : Tongue; Palate Initiation of swallow : Pyriform sinuses Soft palate elevation: No bolus between soft palate (SP)/pharyngeal wall (PW) Laryngeal elevation: Partial superior movement of thyroid cartilage/partial approximation of arytenoids to epiglottic petiole Anterior hyoid excursion: Complete Epiglottic movement: Complete  Laryngeal vestibule closure: Complete, no air/contrast in laryngeal vestibule Pharyngeal stripping wave : Present - complete Pharyngeal contraction (A/P view only): N/A Pharyngoesophageal segment opening: Complete distension and complete duration, no obstruction of flow Tongue base retraction: Trace column of contrast or air between tongue base and PPW Pharyngeal residue: Trace residue within  or on pharyngeal structures Location of pharyngeal residue: Diffuse (>3 areas) Penetration/Aspiration Scale (PAS) score: 1.  Material does not enter airway  Moderately Thick Liquids: Moderately thick liquids (Level 3, honey thick) Moderately thick liquids (Level 3, honey thick): Impaired Bolus delivery method: Cup; Spoon Moderately Thick Liquid - Impairment: Oral Impairment; Pharyngeal impairment Lip Closure: No labial escape Tongue control during bolus hold: Escape to lateral buccal cavity/floor of mouth Bolus transport/lingual motion: Slow tongue motion Oral residue: Trace residue lining oral structures Location of oral residue : Tongue; Palate Initiation of swallow : Pyriform sinuses Soft palate elevation: No bolus between soft palate (SP)/pharyngeal wall (PW) Laryngeal elevation: Partial superior movement of thyroid cartilage/partial approximation of arytenoids to epiglottic petiole Anterior hyoid excursion: Complete Epiglottic movement: Partial Laryngeal vestibule closure: Incomplete, narrow column air/contrast in laryngeal vestibule Pharyngeal stripping wave : Present - complete Pharyngeal contraction (A/P view only): N/A Pharyngoesophageal segment opening: Complete distension and complete duration, no obstruction of flow Tongue base retraction: Trace column of contrast or air between tongue base and PPW Pharyngeal residue: Collection of residue within or on pharyngeal structures Location of pharyngeal residue: Diffuse (>3 areas) Penetration/Aspiration Scale (PAS) score: 2.  Material enters airway, remains ABOVE vocal  cords then ejected out  Puree: Puree Puree: Impaired Puree - Impairment: Oral Impairment; Pharyngeal impairment Lip Closure: No labial escape Bolus transport/lingual motion: Slow tongue motion Oral residue: Trace residue lining oral structures Location of oral residue : Tongue; Palate Initiation of swallow: Posterior laryngeal surface of the epiglottis Soft palate elevation: No bolus between soft palate (SP)/pharyngeal wall (PW) Laryngeal elevation: Complete superior movement of thyroid cartilage with complete approximation of arytenoids to epiglottic petiole Anterior hyoid excursion: Complete Epiglottic movement: Complete Laryngeal vestibule closure: Complete, no air/contrast in laryngeal vestibule Pharyngeal stripping wave : Present - complete Pharyngeal contraction (A/P view only): N/A Pharyngoesophageal segment opening: Complete distension and complete duration, no obstruction of flow Tongue base retraction: Trace column of contrast or air between tongue base and PPW Pharyngeal residue: Collection of residue within or on pharyngeal structures Location of pharyngeal residue: Diffuse (>3 areas) Penetration/Aspiration Scale (PAS) score: 1.  Material does not enter airway Solid: Solid Solid: Not Tested Pill: Pill Pill: Not Tested Compensatory Strategies: Compensatory Strategies Compensatory strategies: Yes Multiple swallows: Ineffective Ineffective Multiple Swallows: Mildly thick liquid (Level 2, nectar thick); Moderately thick liquid (Level 3, honey thick)   General Information: Caregiver present: No  Diet Prior to this Study: Dysphagia 1 (pureed); Mildly thick liquids (Level 2, nectar thick)   Temperature : Normal   Respiratory Status: WFL   Supplemental O2: None (Room air)   History of Recent Intubation: No  Behavior/Cognition: Alert; Cooperative; Pleasant mood Self-Feeding Abilities: Able to self-feed; Needs set-up for self-feeding Baseline vocal quality/speech: Other (comment) (strained vocal quality and low  intensity) Volitional Cough: Able to elicit Volitional Swallow: Able to elicit Exam Limitations: Other (comment); Fatigue; Poor positioning Goal Planning: Prognosis for improved oropharyngeal function: Fair Barriers to Reach Goals: Cognitive deficits; Severity of deficits; Time post onset; Overall medical prognosis No data recorded Patient/Family Stated Goal: To continue eating and drinking Consulted and agree with results and recommendations: Patient; Physician; Family member/caregiver; Nurse; Nurse Tech Pain: Pain Assessment Pain Assessment: No/denies pain End of Session: Start Time:SLP Start Time (ACUTE ONLY): 1045 Stop Time: SLP Stop Time (ACUTE ONLY): 1100 Time Calculation:SLP Time Calculation (min) (ACUTE ONLY): 15 min Charges: SLP Evaluations $ SLP Speech Visit: 1 Visit SLP Evaluations $BSS Swallow: 1 Procedure $MBS Swallow: 1 Procedure $Swallowing Treatment:  1 Procedure SLP visit diagnosis: SLP Visit Diagnosis: Dysphagia, oropharyngeal phase (R13.12); Dysarthria and anarthria (R47.1) Past Medical History: Past Medical History: Diagnosis Date  Allergy   Arthritis   Broken ankle   Cataract   Diabetes mellitus without complication ([place])   History of colonoscopy   Hypertension   Macular degeneration syndrome   with edema---being treated.   Stage 4 chronic kidney disease ([place])   Stroke ([place]) 2019 Past Surgical History: Past Surgical History: Procedure Laterality Date  callous removal  Left 2017  located on 1st digit on L foot 2/2 diabetes  EYE SURGERY   Bethany A Lutes 08/23/2022, 9:19 PM  No results for input(s): "WBC", "HGB", "HCT", "PLT" in the last 72 hours.   No results for input(s): "NA", "K", "CL", "CO2", "GLUCOSE", "BUN", "CREATININE", "CALCIUM" in the last 72 hours.   Intake/Output Summary (Last 24 hours) at 08/24/2022 1143 Last data filed at 08/24/2022 0300 Gross per 24 hour  Intake 554.99 ml  Output --  Net 554.99 ml        Physical Exam: Vital Signs Blood pressure (!) 180/67, pulse 72,  temperature 98 F (36.7 C), resp. rate 15, height 5' 2"$  (1.575 m), weight 64.7 kg, SpO2 98 %. Constitutional:      Comments: Frail appearing; awake, somewhat alert, but very little speech; Sitting up in bed; daughter at bedside, NAD, BMI 26.09 HENT:     Head: Normocephalic and atraumatic.     Comments: L facial droop- from prior stroke, but worsened later in day Tongue midline    Right Ear: External ear normal.     Left Ear: External ear normal.     Nose: Nose normal. No congestion.     Mouth/Throat:     Mouth: Mucous membranes are dry.     Pharynx: Oropharynx is clear. No oropharyngeal exudate.  Eyes:     General:        Right eye: No discharge.        Left eye: No discharge.     Extraocular Movements: Extraocular movements intact.     Comments: Nystagmus to L>R  Cardiovascular:     Rate and Rhythm: Normal rate and regular rhythm.     Heart sounds: Normal heart sounds. No murmur heard.    No gallop.  Pulmonary:     Effort: No respiratory distress.     Breath sounds: Normal breath sounds. No wheezing, rhonchi or rales. Good air movement. Abdominal:     General:normoactive BS. There is no distension.     Palpations: Abdomen is soft.     Tenderness: There is no abdominal tenderness.  Musculoskeletal:     Cervical back: Neck supple. No tenderness.     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  Speech is mildly dysarthric but intelligible.  Follows simple commands.  Provides name and age. Finger to nose on R side ok, but cannot test L Denies dizziness Squat pivots Max A  Psychiatric:     Comments: Flat, decreased interaction  GU: voiding on own! Total A for peri  hygiene Skin: IV in place  Assessment/Plan: 1. Functional deficits which require 3+ hours per day of  interdisciplinary therapy in a comprehensive inpatient rehab setting. Physiatrist is providing close team supervision and 24 hour management of active medical problems listed below. Physiatrist and rehab team continue to assess barriers to discharge/monitor patient progress toward functional and medical goals  Care Tool:  Bathing    Body parts bathed by patient: Left arm, Chest, Abdomen, Right upper leg, Left upper leg, Face   Body parts bathed by helper: Right arm, Front perineal area, Buttocks, Right lower leg, Left lower leg     Bathing assist Assist Level: Maximal Assistance - Patient 24 - 49%     Upper Body Dressing/Undressing Upper body dressing   What is the patient wearing?: Pull over shirt    Upper body assist Assist Level: Moderate Assistance - Patient 50 - 74%    Lower Body Dressing/Undressing Lower body dressing      What is the patient wearing?: Underwear/pull up, Pants     Lower body assist Assist for lower body dressing: Total Assistance - Patient < 25%     Toileting Toileting    Toileting assist Assist for toileting: 2 Helpers     Transfers Chair/bed transfer  Transfers assist     Chair/bed transfer assist level: Total Assistance - Patient < 25%     Locomotion Ambulation   Ambulation assist   Ambulation activity did not occur: Safety/medical concerns          Walk 10 feet activity   Assist  Walk 10 feet activity did not occur: Safety/medical concerns        Walk 50 feet activity   Assist Walk 50 feet with 2 turns activity did not occur: Safety/medical concerns         Walk 150 feet activity   Assist Walk 150 feet activity did not occur: Safety/medical concerns         Walk 10 feet on uneven surface  activity   Assist Walk 10 feet on uneven surfaces activity did not occur: Safety/medical concerns          Wheelchair     Assist Is the patient using a wheelchair?: Yes Type of Wheelchair: Manual    Wheelchair assist level: Maximal Assistance - Patient 25 - 49% Max wheelchair distance: 83f    Wheelchair 50 feet with 2 turns activity    Assist        Assist Level: Total Assistance - Patient < 25%   Wheelchair 150 feet activity     Assist      Assist Level: Total Assistance - Patient < 25%   Blood pressure (!) 180/67, pulse 72, temperature 98 F (36.7 C), resp. rate 15, height 5' 2"$  (1.575 m), weight 64.7 kg, SpO2 98 %.  Medical Problem List and Plan: 1. 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  Impaired mobility: daughter notes mom does not tolerate lovenox, d/ced, SCDs ordered.            continue  Aspirin 81 mg daily, plavix for 3 weeks.  3. Pain: N/A. Oxycodone discontinued. 4. Insomnia: resolved. Trazodone and zoloft d/ced 5. Neuropsych/cognition: This patient is? capable of making decisions on her own behalf. 6. Skin/Wound Care: Routine skin checks  -Protective cream to b/l heels 7. Fluids/Electrolytes/Nutrition: Routine in and outs with follow-up chemistries 8.  Dysphagia.  Dysphagia #3 nectar thick liquids.  Follow-up speech therapy  Downgraded to D1/nectar, discussed with SLP and daughter 56.  Hypertension.  Hydralazine 25 mg every 8 hours.  Losartan recently held due to AKI. Daughter prefers slow reduction of BP- at baseline she runs [id number] systolic   Hydralazine changed to PRN  for BP >180 as per daughter's request, losartan 43m restarted, increased to BID, daughter reports she had allergy to multiple other BP medications, she will bring a list later time  10.  Acute urinary retention.  d/c flomax. Resolved.  11.  AKI on CKD stage III.  Baseline creatinine 1.9-2.0.  Cr reviewed from 2/13 and is better than baseline, repeat on Monday.  12.  Diabetes mellitus.  Hemoglobin A1c 7.8.  SSI.  Previously patient had declined initiation of any diabetic medications per documentation.  Will follow-up with family. Placed order for no juice. Provided dietary education 13.  History of right ankle fracture.  Patient does wear an ankle stabilizing brace due to previous ankle fx. . 14.  Hyperlipidemia.  Zetia/fenofibrate. 15.  Constipation.  Miralax d/ced as per daughter's preference. Senokot 2 tabs at bedtime as needed.  Sorbitol caused side effects.   -2/10 reports improved after a few BM  2/11 LBM today 16. 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 Kpad ordered. Discuss oral spasticity medications 17. Suboptimal vitamin D: continue ergocalciferol 50,000U once per week for 7 weeks, discussed that this can help with fatigue 18. Poor PO fluid intake  -IVF overnight today 512mhr 19. Fluctuating strength: MRI brain and EEG ordered 20. Fatigue: discussed amantadine and modafinil 21. HTN: increase losartan to 2561mD 22. New stroke: continue aspirin/plavix 21 days from 2/13      LOS: 11 days A FACE TO FACE EVALUATION WAS PERFORMED  [name] 08/24/2022, 11:43 AM

## 2022-08-24 NOTE — Progress Notes (Signed)
Occupational Therapy Session Note  Patient Details  Name: DAFNEY RECENDEZ MRN: [id number] Date of Birth: 1946/06/11  Today's Date: 08/24/2022 OT Individual Time: [id number] OT Individual Time Calculation (min): 40 min    Short Term Goals: Week 1:  OT Short Term Goal 1 (Week 1): Pt will transfer to [facility] with max A +1 consistently OT Short Term Goal 1 - Progress (Week 1): Progressing toward goal OT Short Term Goal 2 (Week 1): Pt will don shirt with min A OT Short Term Goal 2 - Progress (Week 1): Met OT Short Term Goal 3 (Week 1): Pt will sit unsupported during functional task with supervision OT Short Term Goal 3 - Progress (Week 1): Met OT Short Term Goal 4 (Week 1): Pt will perform sit to stand with max A consistently for clothing management with LRAD OT Short Term Goal 4 - Progress (Week 1): Progressing toward goal Week 2:  OT Short Term Goal 1 (Week 2): Pt will transfer to [facility] wiht +1 A and LRAD OT Short Term Goal 2 (Week 2): Pt will don shirt with S OT Short Term Goal 3 (Week 2): Pt will bridge hips in bed with MIN A to improve BOC wiht clothing managment OT Short Term Goal 4 (Week 2): pt will thread 1LE into pants wiht AE PRN  Skilled Therapeutic Interventions/Progress Updates:     Pt received in TIS with no pain but very fatigued throughout session   Therapeutic activity Pt demo decreased initiation this date with mobility tasks requiring increased A for SB transfers, lateral scooting and weight shifting. SB TSI<>EOB with overall MAX in both uphill and HEAVY MOD A for downhill with somewhat better initiation, but overall needing multimodal cuing. Seated ball toss with wedge under R hip to promote midline orientation. Seated break leaning onto OT lap with towel roll in inverted "T" shape to try to achieve neutral pelvis and open through chest/shoulders.   Pt left at end of session in bed with exit alarm on, call light in reach and all needs met   Therapy  Documentation Precautions:  Precautions Precautions: Fall Precaution Comments: premorbid L hemi Required Braces or Orthoses: Other Brace Other Brace: R ankle stabilizing brace from prior ankle fx Restrictions Weight Bearing Restrictions: Yes RLE Weight Bearing: Weight bearing as tolerated LLE Weight Bearing: Non weight bearing Other Position/Activity Restrictions: Old L sided weakness from a prior CVA from ~3 years ago. History of R abkle fracture ~1-1.5 yr ago General:   Therapy/Group: Individual Therapy  Tonny Branch 08/24/2022, 6:52 AM

## 2022-08-24 NOTE — Consult Note (Cosign Needed)
Stroke Neurology Consultation Note    Reason for Consult: Code Stroke  Consult Date:  08/24/22  The history was obtained from the chart and daughter at bedside.  During history and examination, all items were able to obtain unless otherwise noted.  History of Present Illness:  [name] is an 77 y.o.  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  She presented to the ED on 08/06/2022 with worsening lethargy, generalized weakness, poor p.o. intake.  MRI of the brain demonstrated evolution of recent left lateral medullary infarct.  Patient was continued on aspirin and Plavix.  Stroke team signed off on 2/14 and patient was admitted to rehab.  Code stroke was called today due to daughter's concern of possible increased left facial droop, new trouble swallowing, increased coughing.  Code stroke was called at 1420.  Responded to code stroke with Dr. Erlinda Hong, examined at bedside.  When he first arrived patient was very drowsy and was not responding verbally to her questions however she was following commands she did have an intermittent weak cough she did have a left-sided facial droop and she was leaning to the right.  Discussed patient's stroke location and residuals from that would include issue swallowing, aphasia with daughter at bedside.  Daughter stated that she would not want patient to have to go on alternative means of nutrition or have her be n.p.o.  A palliative care consult was entered earlier today.  During our assessment patient became more alert and was answering questions with dysarthric speech, continue to follow commands, stated she felt her normal.  Code stroke was canceled by Dr. Erlinda Hong based on these findings.  Date last known well: 08/24/22 Time last known well: Time: 13:30 tPA Given: No, symptoms resolved IR?: NO, symptoms resolved MRS:  4 NIHSS:  6 Code Stroke cancelled.    Past Medical  History:  Diagnosis Date   Allergy    Arthritis    Broken ankle    Cataract    Diabetes mellitus without complication ([place])    History of colonoscopy    Hypertension    Macular degeneration syndrome    with edema---being treated.    Stage 4 chronic kidney disease ([place])    Stroke ([place]) 2019     Past Surgical History:  Procedure Laterality Date   callous removal  Left 2017   located on 1st digit on L foot 2/2 diabetes   EYE SURGERY      Family History  Problem Relation Age of Onset   Kidney disease Mother    Congestive Heart Failure Father    COPD Father    COPD Brother    Diabetes Maternal Aunt      Social History:  reports that she has never smoked. She has never used smokeless tobacco. She reports that she does not drink alcohol and does not use drugs.  Review of Systems: Unable to assess due to altered mental status.  Allergies:  Allergies  Allergen Reactions   Cephalexin Diarrhea and Nausea And Vomiting   Clonidine Anaphylaxis, Swelling and Other (See Comments)    Tongue swelling   Norvasc [Amlodipine Besylate] Swelling    Edema and TONGUE swelling    Lactose Intolerance (Gi) Diarrhea   Latex Other (See Comments)    Skin blisters   Lisinopril Cough   Morphine And Related Itching and Other (See Comments)    Throat itched and became scratchy   Tape  Itching and Other (See Comments)    Some tapes irritate the skin and others don't (redness/itchiness)   Amlodipine Anxiety, Nausea Only and Swelling   Codeine Itching and Rash   Crestor [Rosuvastatin] Rash    Red and flushed in her face     Medications: I have reviewed the patient's current medications. Prior to Admission:  Medications Prior to Admission  Medication Sig Dispense Refill Last Dose   Accu-Chek Softclix Lancets lancets Use to test blood sugar twice daily. Dx: E11.22 200 each 3    acetaminophen (RA ACETAMINOPHEN) 650 MG CR tablet Take 1 tablet (650 mg total) by mouth every 8 (eight) hours as  needed for pain. 100 tablet 2    AMBULATORY NON FORMULARY MEDICATION Take 1 spray by mouth See admin instructions. Medication Name: "Anxious Moments" oral spray- Spray into the mouth at bedtime as directed      Blood Glucose Calibration (ACCU-CHEK GUIDE CONTROL) LIQD Use in Checking blood sugar twice daily. Dx: E11.22 1 each 3    Blood Glucose Monitoring Suppl (ACCU-CHEK GUIDE ME) w/Device KIT Use to test blood sugar twice daily. Dx: E11.22 1 kit 0    clopidogrel (PLAVIX) 75 MG tablet Take 1 tablet (75 mg total) by mouth daily for 18 days. 18 tablet 0    glucose blood (ACCU-CHEK GUIDE) test strip Use to check blood sugar twice daily. Dx: E11.22 200 each 3    hydrALAZINE (APRESOLINE) 50 MG tablet Take 0.5 tablets (25 mg total) by mouth every 8 (eight) hours.      losartan (COZAAR) 25 MG tablet TAKE 1 TABLET BY MOUTH TWICE A DAY (Patient taking differently: Take 25 mg by mouth daily.) 180 tablet 1    senna-docusate (SENOKOT-S) 8.6-50 MG tablet Take 2 tablets by mouth at bedtime as needed for moderate constipation.      Scheduled:  aspirin  81 mg Oral QHS   clopidogrel  75 mg Oral QHS   Gerhardt's butt cream   Topical Daily   leptospermum manuka honey  1 Application Topical Daily   losartan  25 mg Oral BID   nystatin  5 mL Oral QID   potassium chloride  40 mEq Oral Once   senna-docusate  1 tablet Oral QHS   Vitamin D (Ergocalciferol)  50,000 Units Oral Q Wed   Continuous:  sodium chloride      Test Results: CBC:  Recent Labs  Lab 08/19/22 1825 08/21/22 1121  WBC 7.3 7.3  HGB 12.8 13.1  HCT 39.9 39.8  MCV 84.0 82.9  PLT 245 [id number]   Basic Metabolic Panel:  Recent Labs  Lab 08/20/22 0545 08/21/22 1048  NA  --  135  K  --  4.1  CL  --  107  CO2  --  18*  GLUCOSE  --  155*  BUN  --  21  CREATININE 1.66* 1.80*  CALCIUM  --  8.7*   Liver Function Tests: Recent Labs  Lab 08/21/22 1048  AST 19  ALT 19  ALKPHOS 111  BILITOT 0.3  PROT 6.0*  ALBUMIN 3.0*   No results for  input(s): "LIPASE", "AMYLASE" in the last 168 hours. No results for input(s): "AMMONIA" in the last 168 hours. Coagulation Studies: No results for input(s): "LABPROT", "INR" in the last 72 hours. Cardiac Enzymes: No results for input(s): "CKTOTAL", "CKMB", "CKMBINDEX", "TROPONINI" in the last 168 hours. BNP: Invalid input(s): "POCBNP" CBG:  Recent Labs  Lab 08/23/22 0602 08/23/22 1200 08/23/22 1616 08/24/22 0550 08/24/22 1152  GLUCAP 112* 157* 160* 134* 168*   Urinalysis:  Recent Labs  Lab 08/21/22 1805  [place] 1.009  PHURINE 7.0  GLUCOSEU [place] NEGATIVE  PROTEINUR 100*  NITRITE NEGATIVE  LEUKOCYTESUR NEGATIVE   Microbiology:  Results for orders placed or performed during the hospital encounter of 08/07/22  Resp panel by RT-PCR (RSV, Flu A&B, Covid) Anterior Nasal Swab     Status: None   Collection Time: 08/07/22  9:11 AM   Specimen: Anterior Nasal Swab  Result Value Ref Range Status   SARS Coronavirus 2 by RT PCR NEGATIVE NEGATIVE Final    Comment: (NOTE) SARS-CoV-2 target nucleic acids are NOT DETECTED.  The SARS-CoV-2 RNA is generally detectable in upper respiratory specimens during the acute phase of infection. The lowest concentration of SARS-CoV-2 viral copies this assay can detect is 138 copies/mL. A negative result does not preclude SARS-Cov-2 infection and should not be used as the sole basis for treatment or other patient management decisions. A negative result may occur with  improper specimen collection/handling, submission of specimen other than nasopharyngeal swab, presence of viral mutation(s) within the areas targeted by this assay, and inadequate number of viral copies(<138 copies/mL). A negative result must be combined with clinical observations, patient history, and epidemiological information. The expected result is Negative.  Fact Sheet for Patients:   EntrepreneurPulse.com.au  Fact Sheet for Healthcare Providers:  IncredibleEmployment.be  This test is no t yet approved or cleared by the [place] FDA and  has been authorized for detection and/or diagnosis of SARS-CoV-2 by FDA under an Emergency Use Authorization (EUA). This EUA will remain  in effect (meaning this test can be used) for the duration of the COVID-19 declaration under Section 564(b)(1) of the Act, 21 U.S.C.section 360bbb-3(b)(1), unless the authorization is terminated  or revoked sooner.       Influenza A by PCR NEGATIVE NEGATIVE Final   Influenza B by PCR NEGATIVE NEGATIVE Final    Comment: (NOTE) The Xpert Xpress SARS-CoV-2/FLU/RSV plus assay is intended as an aid in the diagnosis of influenza from Nasopharyngeal swab specimens and should not be used as a sole basis for treatment. Nasal washings and aspirates are unacceptable for Xpert Xpress SARS-CoV-2/FLU/RSV testing.  Fact Sheet for Patients: EntrepreneurPulse.com.au  Fact Sheet for Healthcare Providers: IncredibleEmployment.be  This test is not yet approved or cleared by the [place] FDA and has been authorized for detection and/or diagnosis of SARS-CoV-2 by FDA under an Emergency Use Authorization (EUA). This EUA will remain in effect (meaning this test can be used) for the duration of the COVID-19 declaration under Section 564(b)(1) of the Act, 21 U.S.C. section 360bbb-3(b)(1), unless the authorization is terminated or revoked.     Resp Syncytial Virus by PCR NEGATIVE NEGATIVE Final    Comment: (NOTE) Fact Sheet for Patients: EntrepreneurPulse.com.au  Fact Sheet for Healthcare Providers: IncredibleEmployment.be  This test is not yet approved or cleared by the [place] FDA and has been authorized for detection and/or diagnosis of SARS-CoV-2 by FDA under an Emergency Use  Authorization (EUA). This EUA will remain in effect (meaning this test can be used) for the duration of the COVID-19 declaration under Section 564(b)(1) of the Act, 21 U.S.C. section 360bbb-3(b)(1), unless the authorization is terminated or revoked.  Performed at [facility], [place] [address], [place], [place] [address]    Lipid Panel:     Component Value Date/Time   CHOL 159 08/08/2022 0456  TRIG 127 08/08/2022 0456   HDL 34 (L) 08/08/2022 0456   CHOLHDL 4.7 08/08/2022 0456   VLDL 25 08/08/2022 0456   LDLCALC 100 (H) 08/08/2022 0456   LDLCALC 141 (H) 02/25/2022 0938   HgbA1c:  Lab Results  Component Value Date   HGBA1C 7.8 (H) 08/08/2022   Urine Drug Screen:     Component Value Date/Time   LABOPIA (A) 02/24/2018 1436    Result not available. Reagent lot number recalled by manufacturer.   COCAINSCRNUR NONE DETECTED 02/24/2018 1436   LABBENZ NONE DETECTED 02/24/2018 1436   AMPHETMU NONE DETECTED 02/24/2018 1436   THCU NONE DETECTED 02/24/2018 1436   LABBARB NONE DETECTED 02/24/2018 1436    Alcohol Level: No results for input(s): "ETH" in the last 168 hours.  DG Swallowing Func-Speech Pathology  Result Date: 08/23/2022 Table formatting from the original result was not included. Modified Barium Swallow Study Patient Details Name: SHAVELL BOWDISH MRN: [id number] Date of Birth: March 03, 1946 Today's Date: 08/23/2022 HPI/PMH: HPI: 77 yo female adm to [facility] with several days of lethargy - treated for UTI, readmitted after found to have drooling, left sided weakness and dysphagia.  Medical history significant of CVA w/ left side residuals, HTN, HLD, DM2, CKD4. 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   Pt passed [facility], but noted to have ongoing symptoms of dysphagia.  Prior MOCA score 29/30 when pt had CVA 2019.  Follow up for  dysphagia treatment indicated. Clinical Impression: Clinical Impression: MBSS completed to assess pt's tolerance for current diet textures given limited clinical utility of tolerance. At this time, pt presents with moderate oropharyngeal dysphagia c/b delayed oral transit resulting in premature spillage to vallecula and trace oral residuals post-swallow. Pharyngeally, pt exhibits decreased base of tongue approximation to posterior pharyngeal wall, diminished laryngeal elevation + closure, decreased epiglottic inversion, and diffuse trace to mild pharyngeal stasis.  Aforementioned findings resulted in deep penetration before the swallow and silent aspiration during the swallow with thin liquid via tsp. Cued cough was ineffective at clearing. Trace-transient penetration noted with honey-thick liquid via cup. No additional s/sx concerning for penetration nor aspiration observed. Appeared to tolerate nectar-thick liquid best when provided via tsp and small cup sips. Pt was noted to be impulsive with intake and requires hand-over-hand cues for safe intake/bolus size monitoring. Cued multiple swallows were not very effective in clearing pharyngeal stasis due to pt's decreased ability to produce volitional throat clear in the absence of PO.  Given objective findings, recommend continuation of Dysphagia 1 consistencies with nectar-thick liquid via tsp only given 1:1 supervision for safety. Recommend medications be crushed and placed in puree. Additionally, recommend pt be OOB for all meals or sitting fully upright in bed, be prompted to hold head in neutral positioning during swallow, and avoid mixed consistencies (try to do all drink and then solids, and then finish with liquids). Results and recommendations reviewed with pt, pt's daughter, and MD; they verbalized understanding and agreement with plan. Will monitor for tolerance. Suspect pt will likely be discharged on this date. Per pt reports, she enjoys pureed textures  and finds them easier to consume. May consider honey-thick liquids if pt is not tolerating nectar-thick liquids via tsp clinically. Factors that may increase risk of adverse event in presence of aspiration ([place] 2021): Factors that may increase risk of adverse event in presence of aspiration Phineas Douglas &  Iva Boop 2021): Frail or deconditioned; Limited mobility; Dependence for feeding and/or oral hygiene; Weak cough; Reduced cognitive function Recommendations/Plan: Swallowing Evaluation Recommendations Swallowing Evaluation Recommendations Recommendations: PO diet PO Diet Recommendation: Dysphagia 1 (Pureed); Mildly thick liquids (Level 2, nectar thick) Liquid Administration via: Spoon Medication Administration: Crushed with puree Supervision: Patient able to self-feed; Full supervision/cueing for swallowing strategies Swallowing strategies  : Minimize environmental distractions; Slow rate; Small bites/sips; Check for pocketing or oral holding; Avoid mixed consistencies Postural changes: Position pt fully upright for meals; Stay upright 30-60 min after meals; Out of bed for meals Oral care recommendations: Oral care BID (2x/day); Staff/trained caregiver to provide oral care; Use suctioning for oral care Caregiver Recommendations: Avoid jello, ice cream, thin soups, popsicles; Have oral suction available; Remove water pitcher Treatment Plan Treatment Plan Treatment recommendations: Therapy as outlined in treatment plan below Follow-up recommendations: Home health SLP Functional status assessment: Patient has had a recent decline in their functional status and/or demonstrates limited ability to make significant improvements in function in a reasonable and predictable amount of time. Treatment frequency: Min 3x/week Treatment duration: 1 week Interventions: Patient/family education; Aspiration precaution training; Diet toleration management by SLP; Respiratory muscle strength training; Trials of upgraded  texture/liquids; Oropharyngeal exercises Recommendations Recommendations for follow up therapy are one component of a multi-disciplinary discharge planning process, led by the attending physician.  Recommendations may be updated based on patient status, additional functional criteria and insurance authorization. Assessment: Orofacial Exam: Orofacial Exam Oral Cavity: Oral Hygiene: WFL Oral Cavity - Dentition: Adequate natural dentition Orofacial Anatomy: WFL Anatomy: Anatomy: WFL Thin Liquids: Thin Liquids (Level 0) Thin Liquids : Impaired Bolus delivery method: Spoon Thin Liquid - Impairment: Oral Impairment; Pharyngeal impairment Lip Closure: No labial escape Tongue control during bolus hold: Posterior escape of less than half of bolus Bolus transport/lingual motion: Slow tongue motion Oral residue: Trace residue lining oral structures Location of oral residue : Tongue Initiation of swallow : Pyriform sinuses Soft palate elevation: No bolus between soft palate (SP)/pharyngeal wall (PW) Laryngeal elevation: Partial superior movement of thyroid cartilage/partial approximation of arytenoids to epiglottic petiole Anterior hyoid excursion: Complete Epiglottic movement: Partial Laryngeal vestibule closure: Incomplete, narrow column air/contrast in laryngeal vestibule Pharyngeal stripping wave : Present - complete Pharyngeal contraction (A/P view only): N/A Pharyngoesophageal segment opening: Complete distension and complete duration, no obstruction of flow Tongue base retraction: Trace column of contrast or air between tongue base and PPW Pharyngeal residue: Trace residue within or on pharyngeal structures Location of pharyngeal residue: Diffuse (>3 areas) Penetration/Aspiration Scale (PAS) score: 8.  Material enters airway, passes BELOW cords without attempt by patient to eject out (silent aspiration)  Mildly Thick Liquids: Mildly thick liquids (Level 2, nectar thick) Mildly thick liquids (Level 2, nectar thick):  Impaired Bolus delivery method: Spoon; Cup Mildly Thick Liquid - Impairment: Oral Impairment; Pharyngeal impairment Lip Closure: No labial escape Tongue control during bolus hold: Posterior escape of less than half of bolus Bolus transport/lingual motion: Slow tongue motion Oral residue: Trace residue lining oral structures Location of oral residue : Tongue; Palate Initiation of swallow : Pyriform sinuses Soft palate elevation: No bolus between soft palate (SP)/pharyngeal wall (PW) Laryngeal elevation: Partial superior movement of thyroid cartilage/partial approximation of arytenoids to epiglottic petiole Anterior hyoid excursion: Complete Epiglottic movement: Complete Laryngeal vestibule closure: Complete, no air/contrast in laryngeal vestibule Pharyngeal stripping wave : Present - complete Pharyngeal contraction (A/P view only): N/A Pharyngoesophageal segment opening: Complete distension and complete duration, no obstruction of flow Tongue base retraction:  Trace column of contrast or air between tongue base and PPW Pharyngeal residue: Trace residue within or on pharyngeal structures Location of pharyngeal residue: Diffuse (>3 areas) Penetration/Aspiration Scale (PAS) score: 1.  Material does not enter airway  Moderately Thick Liquids: Moderately thick liquids (Level 3, honey thick) Moderately thick liquids (Level 3, honey thick): Impaired Bolus delivery method: Cup; Spoon Moderately Thick Liquid - Impairment: Oral Impairment; Pharyngeal impairment Lip Closure: No labial escape Tongue control during bolus hold: Escape to lateral buccal cavity/floor of mouth Bolus transport/lingual motion: Slow tongue motion Oral residue: Trace residue lining oral structures Location of oral residue : Tongue; Palate Initiation of swallow : Pyriform sinuses Soft palate elevation: No bolus between soft palate (SP)/pharyngeal wall (PW) Laryngeal elevation: Partial superior movement of thyroid cartilage/partial approximation of  arytenoids to epiglottic petiole Anterior hyoid excursion: Complete Epiglottic movement: Partial Laryngeal vestibule closure: Incomplete, narrow column air/contrast in laryngeal vestibule Pharyngeal stripping wave : Present - complete Pharyngeal contraction (A/P view only): N/A Pharyngoesophageal segment opening: Complete distension and complete duration, no obstruction of flow Tongue base retraction: Trace column of contrast or air between tongue base and PPW Pharyngeal residue: Collection of residue within or on pharyngeal structures Location of pharyngeal residue: Diffuse (>3 areas) Penetration/Aspiration Scale (PAS) score: 2.  Material enters airway, remains ABOVE vocal cords then ejected out  Puree: Puree Puree: Impaired Puree - Impairment: Oral Impairment; Pharyngeal impairment Lip Closure: No labial escape Bolus transport/lingual motion: Slow tongue motion Oral residue: Trace residue lining oral structures Location of oral residue : Tongue; Palate Initiation of swallow: Posterior laryngeal surface of the epiglottis Soft palate elevation: No bolus between soft palate (SP)/pharyngeal wall (PW) Laryngeal elevation: Complete superior movement of thyroid cartilage with complete approximation of arytenoids to epiglottic petiole Anterior hyoid excursion: Complete Epiglottic movement: Complete Laryngeal vestibule closure: Complete, no air/contrast in laryngeal vestibule Pharyngeal stripping wave : Present - complete Pharyngeal contraction (A/P view only): N/A Pharyngoesophageal segment opening: Complete distension and complete duration, no obstruction of flow Tongue base retraction: Trace column of contrast or air between tongue base and PPW Pharyngeal residue: Collection of residue within or on pharyngeal structures Location of pharyngeal residue: Diffuse (>3 areas) Penetration/Aspiration Scale (PAS) score: 1.  Material does not enter airway Solid: Solid Solid: Not Tested Pill: Pill Pill: Not Tested Compensatory  Strategies: Compensatory Strategies Compensatory strategies: Yes Multiple swallows: Ineffective Ineffective Multiple Swallows: Mildly thick liquid (Level 2, nectar thick); Moderately thick liquid (Level 3, honey thick)   General Information: Caregiver present: No  Diet Prior to this Study: Dysphagia 1 (pureed); Mildly thick liquids (Level 2, nectar thick)   Temperature : Normal   Respiratory Status: WFL   Supplemental O2: None (Room air)   History of Recent Intubation: No  Behavior/Cognition: Alert; Cooperative; Pleasant mood Self-Feeding Abilities: Able to self-feed; Needs set-up for self-feeding Baseline vocal quality/speech: Other (comment) (strained vocal quality and low intensity) Volitional Cough: Able to elicit Volitional Swallow: Able to elicit Exam Limitations: Other (comment); Fatigue; Poor positioning Goal Planning: Prognosis for improved oropharyngeal function: Fair Barriers to Reach Goals: Cognitive deficits; Severity of deficits; Time post onset; Overall medical prognosis No data recorded Patient/Family Stated Goal: To continue eating and drinking Consulted and agree with results and recommendations: Patient; Physician; Family member/caregiver; Nurse; Nurse Tech Pain: Pain Assessment Pain Assessment: No/denies pain End of Session: Start Time:SLP Start Time (ACUTE ONLY): 1045 Stop Time: SLP Stop Time (ACUTE ONLY): 1100 Time Calculation:SLP Time Calculation (min) (ACUTE ONLY): 15 min Charges: SLP Evaluations $ SLP  Speech Visit: 1 Visit SLP Evaluations $BSS Swallow: 1 Procedure $MBS Swallow: 1 Procedure $Swallowing Treatment: 1 Procedure SLP visit diagnosis: SLP Visit Diagnosis: Dysphagia, oropharyngeal phase (R13.12); Dysarthria and anarthria (R47.1) Past Medical History: Past Medical History: Diagnosis Date  Allergy   Arthritis   Broken ankle   Cataract   Diabetes mellitus without complication ([place])   History of colonoscopy   Hypertension   Macular degeneration syndrome   with edema---being treated.    Stage 4 chronic kidney disease ([place])   Stroke ([place]) 2019 Past Surgical History: Past Surgical History: Procedure Laterality Date  callous removal  Left 2017  located on 1st digit on L foot 2/2 diabetes  EYE SURGERY   Bethany A Lutes 08/23/2022, 9:19 PM  DG Chest 2 View  Result Date: 08/20/2022 CLINICAL DATA:  Cough EXAM: CHEST - 2 VIEW COMPARISON:  Chest x-ray 08/07/2022 FINDINGS: The heart is mildly enlarged, unchanged. There central pulmonary vascular congestion. There is no focal lung infiltrate, pleural effusion or pneumothorax. No acute fractures are seen. IMPRESSION: Mild cardiomegaly with central pulmonary vascular congestion. Electronically Signed   By: Ronney Asters M.D.   On: 08/20/2022 20:43   [name] CONTRAST  Result Date: 08/20/2022 CLINICAL DATA:  Stroke suspected EXAM: MRI HEAD WITHOUT CONTRAST TECHNIQUE: Multiplanar, multiecho pulse sequences of the brain and surrounding structures were obtained without intravenous contrast. COMPARISON:  08/07/2022 MRI head, correlation is also made with CT head 08/19/2022 FINDINGS: Brain: Restricted diffusion with ADC correlate along the left anteromedial aspect of the medulla (series 2, image 12 and series 1050, image 12). This is medial to the area of acute infarct noted on 08/07/2022. No acute hemorrhage, mass, mass effect, or midline shift. No hydrocephalus or extra-axial collection. Redemonstrated chronic right parietal infarct and right greater than left cerebellar infarcts, as well as bilateral basal ganglia/corona radiata lacunar infarcts. Advanced cerebral volume loss for age. Confluent T2 hyperintense signal in the periventricular white matter and pons, likely the sequela of moderate to severe chronic small vessel ischemic disease. Vascular: Normal arterial flow voids. Skull and upper cervical spine: Normal marrow signal. Sinuses/Orbits: Clear paranasal sinuses. Status post bilateral lens replacements. Other: The mastoids are well aerated.  IMPRESSION: Acute infarct along the left anteromedial aspect of the medulla, medial to the area of acute infarct noted on 08/07/2022 MRI. These results will be called to the ordering clinician or representative by the Radiologist Assistant, and communication documented in the PACS or [employer]. Electronically Signed   By: Merilyn Baba M.D.   On: 08/20/2022 19:55   CT HEAD WO CONTRAST (5MM)  Result Date: 08/19/2022 CLINICAL DATA:  Stroke suspected EXAM: CT HEAD WITHOUT CONTRAST TECHNIQUE: Contiguous axial images were obtained from the base of the skull through the vertex without intravenous contrast. RADIATION DOSE REDUCTION: This exam was performed according to the departmental dose-optimization program which includes automated exposure control, adjustment of the mA and/or kV according to patient size and/or use of iterative reconstruction technique. COMPARISON:  08/14/2022 FINDINGS: Brain: No evidence of acute infarction, hemorrhage, mass, mass effect, or midline shift. No hydrocephalus or extra-axial fluid collection. Redemonstrated posterior right MCA distribution infarct, right greater than left basal ganglia lacunar infarcts, and right greater than left remote cerebellar infarcts. Periventricular white matter changes, likely the sequela of chronic small vessel ischemic disease. Previously noted left medulla infarct is not visible on CT. Unchanged right greater than left ventricular prominence. Vascular: No hyperdense vessel. Atherosclerotic calcifications in the intracranial carotid and vertebral arteries. Skull: Negative  for fracture or focal lesion. Sinuses/Orbits: No acute finding. Other: The mastoid air cells are well aerated. IMPRESSION: No acute intracranial process. Previously noted left middle infarct is not visible on CT. Electronically Signed   By: Merilyn Baba M.D.   On: 08/19/2022 20:11   CT HEAD WO CONTRAST (5MM)  Result Date: 08/14/2022 CLINICAL DATA:  Initial evaluation for neuro  deficit, stroke suspected. EXAM: CT HEAD WITHOUT CONTRAST TECHNIQUE: Contiguous axial images were obtained from the base of the skull through the vertex without intravenous contrast. RADIATION DOSE REDUCTION: This exam was performed according to the departmental dose-optimization program which includes automated exposure control, adjustment of the mA and/or kV according to patient size and/or use of iterative reconstruction technique. COMPARISON:  Prior MRI from 08/07/2022. FINDINGS: Brain: Age-related cerebral atrophy with chronic small vessel ischemic disease. Chronic posterior right MCA distribution infarct normal the right parietal lobe. Scattered remote lacunar infarcts present about the right basal ganglia. Multiple chronic cerebellar infarcts, right greater than left. Previously identified left medullary infarct not visible by CT. No other acute large vessel territory infarct. No acute intracranial hemorrhage. No mass lesion or midline shift. Ventricular prominence related global parenchymal volume loss of hydrocephalus. No extra-axial fluid collection. Vascular: No abnormal hyperdense vessel. Calcified atherosclerosis present at the skull base. Skull: Scalp soft tissues and calvarium within normal limits. Sinuses/Orbits: Globes orbital soft tissues within normal limits. Paranasal sinuses are largely clear. No mastoid effusion. Other: None. IMPRESSION: 1. No acute intracranial abnormality. Recently identified small left medullary infarct not visible by CT. 2. Age-related cerebral atrophy with extensive chronic ischemic changes as above, stable. Electronically Signed   By: Jeannine Boga M.D.   On: 08/14/2022 19:07   DG Swallowing Func-Speech Pathology  Result Date: 08/08/2022 Table formatting from the original result was not included. Objective Swallowing Evaluation: Type of Study: Bedside Swallow Evaluation  Patient Details Name: SKYLEN MARSTELLER MRN: [id number] Date of Birth: 01-27-1946 Today's Date:  08/08/2022 Time: SLP Start Time (ACUTE ONLY): 1400 -SLP Stop Time (ACUTE ONLY): 1446 SLP Time Calculation (min) (ACUTE ONLY): 46 min Past Medical History: Past Medical History: Diagnosis Date  Allergy   Arthritis   Broken ankle   Cataract   Diabetes mellitus without complication ([place])   History of colonoscopy   Hypertension   Macular degeneration syndrome   with edema---being treated.   Stage 4 chronic kidney disease ([place])   Stroke ([place]) 2019 Past Surgical History: Past Surgical History: Procedure Laterality Date  callous removal  Left 2017  located on 1st digit on L foot 2/2 diabetes  EYE SURGERY   HPI: 77 yo female adm to [facility] with several days of lethargy - treated for UTI, readmitted after found to have drooling, left sided weakness and dysphagia.  Medical history significant of CVA w/ left side residuals, HTN, HLD, DM2, CKD4. 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   Pt passed [place], but noted to have ongoing symptoms of dysphagia.  Prior MOCA score 29/30 when pt had CVA 2019.  Subjective: pt awake in bed, dtr present  Recommendations for follow up therapy are one component of a multi-disciplinary discharge planning process, led by the attending physician.  Recommendations may be updated based on patient status, additional functional criteria and insurance authorization. Assessment / Plan / Recommendation   08/08/2022   8:27 PM Clinical Impressions Clinical Impression Patient presents with moderate  oropharyngeal dysphagia - with sensorimotor deficits.  She demonstrates moderately impaired oral control -most notably with thin and cracker boluses resulting in premature spillage of barium into pharynx and inconsistently oral retention.  Vertical mastication pattern with solid - at anterior oral cavity with partial cracker spilling to vallecular space poorly controlled. She  required suctioning to remove portion of solid cracker that was retained left anterior oral cavity. Pharyngeal swallow with cracker bolus was not elicited until after 3rd bolus spilled into pharynx - at vallecular space - requiring 20 seconds with retained solid in pharynx before swallow was finally triggered. Pharyngeal swallow is severely delayed as barium pools at pyriform sinus with pt frequently requiring verbal cue to swallow - most notably with liquids.  SLP counted "1,2,3.. Swallow" to improve pt's swallow timing.  Audible aspiration occured with nectar when pt was swallowing tablet due to impaired coordination resulting in prolonged oral transiting.  In addition, impaired adequacy of pharyngeal contraction resulted in pharyngeal retentin of nectar that spilled into airway postswallow.  Pt cleared large amount of nectar aspiration with reflexive cough but minimal amount remained.  Various postures including chin tuck and head turn were not helpful.  Due to pt's severe pharyngeal initiation delay, recommend nectar liquids and tsps of thin.  Pt will benefit from oral suction before, during and after meals.  SLP modified diet to changed diet order to dys1/nectar - and educated pt/daughter to findings, recommendations. Will follow for dysphagia management and treatment. With current level of dysphagia, adequacy of po intake for hydration and nutrition is a concern.  Family reports pt has always "swallowed slowly" causing SLP to question if pt may have undiagnosed dysphagia since her 2019 CVA.  SLP Visit Diagnosis Dysphagia, oropharyngeal phase (R13.12);Dysphagia, unspecified (R13.10) Impact on safety and function Moderate aspiration risk;Severe aspiration risk;Risk for inadequate nutrition/hydration     08/08/2022   8:27 PM Treatment Recommendations Treatment Recommendations Therapy as outlined in treatment plan below     08/08/2022   8:48 PM Prognosis Prognosis for Safe Diet Advancement Fair Barriers to Reach Goals  Severity of deficits   08/08/2022   8:27 PM Diet Recommendations SLP Diet Recommendations Dysphagia 1 (Puree) solids;Nectar thick liquid Liquid Administration via Spoon;Cup;Straw Medication Administration Whole meds with puree Compensations Small sips/bites;Slow rate;Other (Comment) Postural Changes Seated upright at 90 degrees;Remain semi-upright after after feeds/meals (Comment)     08/08/2022   8:27 PM Other Recommendations Oral Care Recommendations Oral care before and after PO;Other (Comment) Other Recommendations Have oral suction available;Order thickener from pharmacy Follow Up Recommendations Acute inpatient rehab (3hours/day) Functional Status Assessment Patient has had a recent decline in their functional status and demonstrates the ability to make significant improvements in function in a reasonable and predictable amount of time.   08/08/2022   8:27 PM Frequency and Duration  Speech Therapy Frequency (ACUTE ONLY) min 2x/week Treatment Duration 2 weeks     08/08/2022   8:20 PM Oral Phase Oral Phase Impaired Oral - Honey Teaspoon Delayed oral transit;Premature spillage;Decreased bolus cohesion;Weak lingual manipulation Oral - Nectar Teaspoon Delayed oral transit;Premature spillage;Decreased bolus cohesion;Weak lingual manipulation;Left anterior bolus loss Oral - Nectar Cup Premature spillage;Decreased bolus cohesion;Weak lingual manipulation;Delayed oral transit Oral - Nectar Straw Weak lingual manipulation;Premature spillage;Decreased bolus cohesion;Delayed oral transit Oral - Thin Teaspoon Premature spillage;Weak lingual manipulation;Left anterior bolus loss;Decreased bolus cohesion Oral - Thin Cup Premature spillage;Weak lingual manipulation;Decreased bolus cohesion;Lingual/palatal residue Oral - Thin Straw Premature spillage;Weak lingual manipulation;Left anterior bolus loss;Decreased bolus cohesion;Lingual/palatal residue Oral - Puree Lingual  pumping;Weak lingual manipulation;Delayed oral transit;Premature  spillage;Lingual/palatal residue Oral - Mech Soft Decreased bolus cohesion;Premature spillage;Impaired mastication;Weak lingual manipulation;Reduced posterior propulsion;Pocketing in anterior sulcus;Lingual/palatal residue Oral - Pill Premature spillage;Decreased bolus cohesion;Reduced posterior propulsion;Weak lingual manipulation;Lingual/palatal residue Oral Phase - Comment oral suction provided before, during and after MBS, pt allows secretions to pool in pharynx; oral transiting delays up to 20 seconds - or as little as 4 seconds, oral retention spills into pharynx post-swallow    08/08/2022   8:23 PM Pharyngeal Phase Pharyngeal Phase Impaired Pharyngeal- Honey Teaspoon Delayed swallow initiation-pyriform sinuses;Delayed swallow initiation-vallecula Pharyngeal Material does not enter airway Pharyngeal- Nectar Teaspoon Delayed swallow initiation-pyriform sinuses Pharyngeal Material does not enter airway Pharyngeal- Nectar Cup Delayed swallow initiation-pyriform sinuses Pharyngeal Material does not enter airway Pharyngeal- Nectar Straw Delayed swallow initiation-pyriform sinuses Pharyngeal Material does not enter airway Pharyngeal- Thin Teaspoon Penetration/Aspiration during swallow;Delayed swallow initiation-pyriform sinuses Pharyngeal Material enters airway, remains ABOVE vocal cords and not ejected out Pharyngeal- Thin Cup Delayed swallow initiation-pyriform sinuses;Penetration/Aspiration during swallow Pharyngeal Material enters airway, remains ABOVE vocal cords and not ejected out Pharyngeal- Thin Straw Delayed swallow initiation-pyriform sinuses;Penetration/Aspiration during swallow Pharyngeal Material enters airway, remains ABOVE vocal cords and not ejected out Pharyngeal- Puree Delayed swallow initiation-vallecula Pharyngeal Material does not enter airway Pharyngeal- Mechanical Soft Delayed swallow initiation-pyriform sinuses;Delayed swallow initiation-vallecula Pharyngeal Material does not enter airway  Pharyngeal- Pill Penetration/Apiration after swallow;Reduced laryngeal elevation;Reduced airway/laryngeal closure;Moderate aspiration;Pharyngeal residue - pyriform Pharyngeal Material enters airway, passes BELOW cords and not ejected out despite cough attempt by patient Pharyngeal Comment Pharyngeal swallow severely delayed - triggering at pyriform sinus consistently requring counting cues to trigger swallow, aspiration occured with nectar mixed with secretions due to impaired coordination with swallow resulting in prolonged oral transiting - impaired adequacy of pharyngeal contraction with retention of nectar spilling into airway postswallow - causing reflexive cough.  Pt cleared large amount of aspiration with reflexive cough but minimal amount remained.    08/08/2022   8:27 PM Cervical Esophageal Phase  Cervical Esophageal Phase Impaired Barium tablet appeared to stall at distal esophagus, more liquids aided clearance. Pt also appeared with dilated esophagus with barium retention that appeared mixed with secretions.  Radiologist was not present and MBS does not diagnose below the UES.  Suspect component of esophageal motility deficits. Kathleen Lime, MS [facility] SLP Acute Rehab Services Office [phone] Macario Golds 08/08/2022, 8:51 PM                     DG Abd 1 View  Result Date: 08/08/2022 CLINICAL DATA:  Abdominal distension. EXAM: ABDOMEN - 1 VIEW COMPARISON:  Pelvic CT 09/21/2018.  Chest radiographs 08/07/2022. FINDINGS: 1054 hours. Two supine views are submitted. The bowel gas pattern is normal. No supine evidence of free intraperitoneal air. Possible right renal calculus. There are scattered vascular calcifications. The bladder appears mildly distended. No acute osseous findings are seen. There is telemetry leads overlie the abdomen and lower chest. IMPRESSION: No evidence of bowel obstruction or other acute findings. Possible right renal calculus and bladder distension. Electronically Signed   By: Richardean Sale M.D.   On: 08/08/2022 11:12   [name] CONTRAST  Result Date: 08/07/2022 CLINICAL DATA:  Transient ischemic attack (TIA). Left facial droop. Nausea and vomiting. EXAM: MRI HEAD WITHOUT CONTRAST TECHNIQUE: Multiplanar, multiecho pulse sequences of the brain and surrounding structures were obtained without intravenous contrast. COMPARISON:  Head CT 08/07/2022 and MRI 02/24/2018 FINDINGS: Brain: There is a 4 mm acute infarct in  the upper left medulla. No mass, midline shift, or extra-axial fluid collection is identified. A moderate-sized chronic right parietal infarct and chronic bilateral corona radiata/basal ganglia lacunar infarcts are again noted. There are chronic blood products associated with an old right basal ganglia infarct. T2 hyperintensities elsewhere in the cerebral white matter bilaterally have progressed from the prior MRI and are nonspecific but compatible with chronic small vessel ischemic disease. Chronic bilateral cerebellar and pontine infarcts are largely new from 2019. There is moderate cerebral atrophy. Vascular: Major intracranial vascular flow voids are preserved. Skull and upper cervical spine: Unremarkable bone marrow signal. Sinuses/Orbits: Bilateral cataract extraction. Paranasal sinuses and mastoid air cells are clear. Other: None. IMPRESSION: 1. 4 mm acute left medullary infarct. 2. Progressive chronic small vessel ischemic disease since 2019 with multiple chronic infarcts as above. Electronically Signed   By: Logan Bores M.D.   On: 08/07/2022 12:01   CT HEAD WO CONTRAST (5MM)  Result Date: 08/07/2022 CLINICAL DATA:  Transient ischemic attack.  Altered mental status. EXAM: CT HEAD WITHOUT CONTRAST TECHNIQUE: Contiguous axial images were obtained from the base of the skull through the vertex without intravenous contrast. RADIATION DOSE REDUCTION: This exam was performed according to the departmental dose-optimization program which includes automated exposure control,  adjustment of the mA and/or kV according to patient size and/or use of iterative reconstruction technique. COMPARISON:  07/30/2022 FINDINGS: Brain: There is encephalomalacia of the RIGHT posterior parietal lobe. There are remote infarcts of the RIGHT corona radiata and bilateral basal ganglia. Significant periventricular white matter changes are present. There is central and cortical atrophy. There is a remote RIGHT cerebellar infarct. There is no intra or extra-axial fluid collection or mass lesion. The basilar cisterns and ventricles have a normal appearance. There is no CT evidence for acute infarction or hemorrhage. Vascular: There is dense atherosclerotic calcification of the internal carotid arteries. No hyperdense vessels. Skull: Normal. Negative for fracture or focal lesion. Sinuses/Orbits: No acute finding. Other: None. IMPRESSION: 1. Multiple remote infarcts. 2. Atrophy and small vessel disease. 3. No evidence for acute intracranial abnormality. Electronically Signed   By: Nolon Nations M.D.   On: 08/07/2022 10:50   DG Chest 2 View  Result Date: 08/07/2022 CLINICAL DATA:  Weakness. EXAM: CHEST - 2 VIEW COMPARISON:  09/22/2018 FINDINGS: Coarse lung markings are suggestive for chronic changes. No evidence for airspace disease or pulmonary edema. Heart and mediastinum are within normal limits. Atherosclerotic calcifications at the aortic arch. Irregularity of the posterior left seventh rib likely represents an old fracture. No pleural effusions. IMPRESSION: Chronic lung changes without acute findings. Electronically Signed   By: Markus Daft M.D.   On: 08/07/2022 10:06   CT HEAD WO CONTRAST  Result Date: 07/30/2022 CLINICAL DATA:  Generalized weakness EXAM: CT HEAD WITHOUT CONTRAST TECHNIQUE: Contiguous axial images were obtained from the base of the skull through the vertex without intravenous contrast. RADIATION DOSE REDUCTION: This exam was performed according to the departmental dose-optimization  program which includes automated exposure control, adjustment of the mA and/or kV according to patient size and/or use of iterative reconstruction technique. COMPARISON:  No prior CT head, correlation is made with MRI head 02/24/2018 FINDINGS: Brain: No evidence of acute infarction, hemorrhage, mass, mass effect, or midline shift. No hydrocephalus or extra-axial fluid collection. Encephalomalacia in the right parietal lobe, right corona radiata lacunar infarct, and bilateral basal ganglia lacunar infarcts, which appear similar to the prior MRI when accounting for differences in technique. Additional remote infarct in the right  cerebellum appears new compared to 2019. Periventricular white matter changes, likely the sequela of chronic small vessel ischemic disease. Vascular: No hyperdense vessel. Atherosclerotic calcifications in the intracranial carotid and vertebral arteries. Skull: Negative for fracture or focal lesion. Sinuses/Orbits: Clear paranasal sinuses. Status post bilateral lens replacements. Other: The mastoid air cells are well aerated. IMPRESSION: No acute intracranial process. Electronically Signed   By: Merilyn Baba M.D.   On: 07/30/2022 16:31      Physical Examination: Temp:  [98 F (36.7 C)-98.9 F (37.2 C)] 98.9 F (37.2 C) (02/17 1426) Pulse Rate:  [72-93] 90 (02/17 1426) Resp:  [15-18] 16 (02/17 1426) BP: (128-182)/(63-85) 158/67 (02/17 1426) SpO2:  [97 %-99 %] 97 % (02/17 1426)  General -frail appearing, no acute distress Cardiovascular - Regular rate and rhythm.  Mental Status -  Patient drowsy upon entering room after code stroke call.  However throughout exam patient improved to alert with increased speech.  Dysarthria present.  Follows commands.  Oriented to self, place, month, age.  Cranial Nerves II - XII - II - Visual field intact OU. III, IV, VI - Extraocular movements intact. V - Facial sensation intact bilaterally. VII - Facial movement intact bilaterally. VIII  - Hearing & vestibular intact bilaterally. X - Palate elevates symmetrically. XI - Chin turning & shoulder shrug intact bilaterally. XII - Tongue protrusion intact.  Motor Strength -  Spontaneous movement of all 4 extremities against gravity.  No pronator drift seen in bilateral upper extremities.  Bulk and tone normal.  Constant lean  towards right side.  No Tremor  Sensory -  Intact and symmetrical to light touch throughout.  Coordination - RAM intact. FNF slow, but intact.   Gait and Station - deferred.   Assessment:  [name] is an 77 y.o.  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  She presented to the ED on 08/06/2022 with worsening lethargy, generalized weakness, poor p.o. intake.  MRI of the brain demonstrated evolution of recent left lateral medullary infarct.  Patient was continued on aspirin and Plavix.  Stroke team signed off on 2/14 and patient was admitted to rehab. Code stroke was called today due to daughter's concern of possible increased left facial droop, new trouble swallowing, increased coughing.  During our assessment patient became more alert and was answering questions with dysarthric speech, continue to follow commands, stated she felt her normal. Code stroke was cancelled.  Please recall neurology with any further questions/concerns.   Pt seen by Neuro NP/APP and later by MD. Note/plan to be edited by MD as needed.    Otelia Santee, DNP, AGACNP-BC [facility] Please use AMION for pager and EPIC for messaging   Thank you for this consultation and allowing [place] to participate in the care of this patient.  ATTENDING NOTE: I reviewed above note and agree with the assessment and plan. Pt was seen and examined.   Code stroke activated during increased coughing spells and concern of increase facial droop. Around 1:30pm, pt received oral care. Since then pt  had several bouts of coughing spells, with red faces and visibly discomfort seen by daughter who also felt pt left facial droop was worse. Therefore code stroke activated. On exam, pt AAO x3, moderate dysarthria but no aphasia, still has left facial droop and left UE mild paresis. Recent MRI repeat 2/13 showed stable left medullary infarct.   Pt symptoms does not seem  to be new infarct or ICH but more silent aspiration after oral care due to dysphagia from her current stroke. CXR negative this time. No further neuro imaging needed at this time, but recommend speech therapist re-evaluation for dysphagia and silent aspiration. Meantime, keep bed raised to decrease saliva aspiration. Suctioning as needed. Daughter said she has been eating fine today. Will continue current management. Please call us back if further questions or neuro changes.   For detailed assessment and plan, please refer to above/below as I have made changes wherever appropriate.   Thanks for the consult and opportunity taking care of this pt.   Rosalin Hawking, MD PhD Stroke Neurology 08/26/2022 8:31 AM    To contact Stroke Continuity provider, please refer to [email or web address] After hours, contact General Neurology

## 2022-08-24 NOTE — Progress Notes (Addendum)
SLP Cancellation Note  Patient Details Name: [name] MRN: [id number] DOB: 1946-01-01   Cancelled treatment:       Upon arrival, patient was lying/leaning to the right side and appeared lethargic. Patient's daughter present and reported she feels the patient was having a TIA due to an increase in her right facial droop. Patient with poor management of secretions resulting in significant anterior spillage out of the right side of her oral cavity and coughing/gagging. Patient's daughter also reported nursing aware. Vitals taken: BP: 128/63, HR:93, O2: 98. Charge nurse also made aware by SLP and charge nurse then initiated a code stroke. SLP attempted to reposition patient but patient with a significant right later lean requiring 3 pillows in attempt to keep head in a semi-neutral position with oral suction provided. Patient left with nursing stroke team present.  Of note, SLP spoke with physician about recommendations for PO intake. A pallative care consult has been made. Both the patient and her daughter do not wish for patient to be NPO nor have any alternative means of nutrition. Therefore, recommendations remain the same at this time with daughter re-educated regarding general aspiration precautions. She verbalized understanding.                                                                                                   Amillion Scobee 08/24/2022, 2:57 PM

## 2022-08-24 NOTE — Progress Notes (Signed)
Physical Therapy Session Note  Patient Details  Name: [name] MRN: [id number] Date of Birth: April 13, 1946  Today's Date: 08/24/2022 PT Individual Time: 1030-1114 PT Individual Time Calculation (min): 44 min   Short Term Goals: Week 2:  PT Short Term Goal 1 (Week 2): = LTG  Skilled Therapeutic Interventions/Progress Updates:      Pt sitting in TIS w/c to start - agreeable to PT tx. [name] pain but reports fatigue.   Transported to main rehab gym and setup outside // bars, facing large mirror. Worked on sit<>stands by pulling herself up to stand, used rolled sheet behind her buttock to assist with lifting her - [name] for sit<>stands and needing maxA for standing balance with standing tolerance ~30 seconds per stand. Tried to encourage and facilitate trunk and cervical extension as patient's posture is very flexed. Patient needing BLE blocked during transfer to use as pivot point for sit<>stand. Used bolster's and pillows to comfort her knees during transfer but due to the heavy assist needed to stand, patient c/o B knee pain during these stands so limited standing trials. Extended seated rest breaks b/w efforts.   Squat<>pivot transfer with maxA to mat table, towards her R side. Remainder of session worked on static and dynamic sitting balance. Pt continues to demonstrate very flexed posture while sitting so worked on awareness and improving thoracic/cervical extension. Supervision for static sitting balance with BLE feet support and RUE on mat table. Practiced reaching outside BOS with RUE - across midline and laterally, CGA for safety while reaching - tossing horseshoes to target without assist to challenge core and sitting balance.   Pt incontinent of BM that likely occurred during session while straining to stand. Squat<>pivot transfer with modA (improved initiation) from mat table to TIS w/c. Returned to her room and assisted back to bed in similar manner. MaxA  needed for sit>supine. NT in room for direct handoff of care for pericare and brief change. All needs met.   Therapy Documentation Precautions:  Precautions Precautions: Fall Precaution Comments: premorbid L hemi Required Braces or Orthoses: Other Brace Other Brace: R ankle stabilizing brace from prior ankle fx Restrictions Weight Bearing Restrictions: Yes RLE Weight Bearing: Weight bearing as tolerated LLE Weight Bearing: Non weight bearing Other Position/Activity Restrictions: Old L sided weakness from a prior CVA from ~3 years ago. History of R abkle fracture ~1-1.5 yr ago General:      Therapy/Group: Individual Therapy  Tatisha Cerino P Detrice Cales PT 08/24/2022, 7:30 AM

## 2022-08-25 DIAGNOSIS — Z515 Encounter for palliative care: Secondary | ICD-10-CM

## 2022-08-25 DIAGNOSIS — I6300 Cerebral infarction due to thrombosis of unspecified precerebral artery: Secondary | ICD-10-CM

## 2022-08-25 DIAGNOSIS — Z7189 Other specified counseling: Secondary | ICD-10-CM

## 2022-08-25 LAB — GLUCOSE, CAPILLARY
Glucose-Capillary: 97 mg/dL (ref 70–99)
Glucose-Capillary: 162 mg/dL — ABNORMAL HIGH (ref 70–99)
Glucose-Capillary: 189 mg/dL — ABNORMAL HIGH (ref 70–99)

## 2022-08-25 NOTE — Progress Notes (Signed)
Patient's BP elevated 211/78 when taken on Dinom[facility], retook BP manually 172/90. Notified patient's RN.

## 2022-08-25 NOTE — Progress Notes (Cosign Needed Addendum)
Patient's BP elevated 211/78 when taken on Dinomap, retook BP manually 172/90. Notified patient's RN.   I attest to this student RN's documentation.

## 2022-08-25 NOTE — Progress Notes (Signed)
PROGRESS NOTE   Subjective/Complaints: Patient has no new complaints this morning, BP was soft this AM to DBP of 46, losartan held, elevated to 172/90 later in the day and administered then   ROS: +lethargy as per daughter-improved today, cognition intact as per team, denies pain, CP, SOB, abd pain, right sided lean improved. +fatigue- improved, insomnia improved, +LUE spasticity, +right hand skin tear- stable   Objective:   DG Chest 2 View  Result Date: 08/24/2022 CLINICAL DATA:  Tachypnea EXAM: CHEST - 2 VIEW COMPARISON:  08/20/2022 FINDINGS: Frontal and lateral views of the chest demonstrate an unremarkable cardiac silhouette. Stable scarring, without acute airspace disease, effusion, or pneumothorax. No acute bony abnormalities. IMPRESSION: 1. No acute intrathoracic process. Electronically Signed   By: Randa Ngo M.D.   On: 08/24/2022 16:10   No results for input(s): "WBC", "HGB", "HCT", "PLT" in the last 72 hours.   No results for input(s): "NA", "K", "CL", "CO2", "GLUCOSE", "BUN", "CREATININE", "CALCIUM" in the last 72 hours.   Intake/Output Summary (Last 24 hours) at 08/25/2022 1540 Last data filed at 08/25/2022 [id number] Gross per 24 hour  Intake 638 ml  Output --  Net 638 ml        Physical Exam: Vital Signs Blood pressure (!) 172/90, pulse 71, temperature 98.6 F (37 C), temperature source Oral, resp. rate 16, height 5' 2"$  (1.575 m), weight 64.7 kg, SpO2 100 %. Constitutional:      Comments: Frail appearing; awake, somewhat alert, but very little speech; Sitting up in bed; daughter at bedside, NAD, BMI 26.09 HENT:     Head: Normocephalic and atraumatic.     Comments: L facial droop- from prior stroke, but worsened later in day Tongue midline. Swallowing better    Right Ear: External ear normal.     Left Ear: External ear normal.     Nose: Nose normal. No congestion.     Mouth/Throat:     Mouth: Mucous  membranes are dry.     Pharynx: Oropharynx is clear. No oropharyngeal exudate.  Eyes:     General:        Right eye: No discharge.        Left eye: No discharge.     Extraocular Movements: Extraocular movements intact.     Comments: Nystagmus to L>R  Cardiovascular:     Rate and Rhythm: Normal rate and regular rhythm.     Heart sounds: Normal heart sounds. No murmur heard.    No gallop.  Pulmonary:     Effort: No respiratory distress.     Breath sounds: Normal breath sounds. No wheezing, rhonchi or rales. Good air movement. Abdominal:     General:normoactive BS. There is no distension.     Palpations: Abdomen is soft.     Tenderness: There is no abdominal tenderness.  Musculoskeletal:     Cervical back: Neck supple. No tenderness.     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  Speech is mildly dysarthric but intelligible.  Follows simple commands.  Provides name and age. Finger to nose on R side ok, but cannot test L Denies dizziness Squat pivots Max A  Psychiatric:     Comments: Flat, decreased interaction  GU: voiding on own! Total A for peri hygiene Skin: IV in place  Assessment/Plan: 1. Functional deficits which require 3+ hours per day of interdisciplinary therapy in a comprehensive inpatient rehab setting. Physiatrist is providing close team supervision and 24 hour management of active medical problems listed below. Physiatrist and rehab team continue to assess barriers to discharge/monitor patient progress toward functional and medical goals  Care Tool:  Bathing    Body parts bathed by patient: Left arm, Chest, Abdomen, Right upper leg, Left upper leg,  Face   Body parts bathed by helper: Right arm, Front perineal area, Buttocks, Right lower leg, Left lower leg     Bathing assist Assist Level: Maximal Assistance - Patient 24 - 49%     Upper Body Dressing/Undressing Upper body dressing   What is the patient wearing?: Pull over shirt    Upper body assist Assist Level: Moderate Assistance - Patient 50 - 74%    Lower Body Dressing/Undressing Lower body dressing      What is the patient wearing?: Underwear/pull up, Pants     Lower body assist Assist for lower body dressing: Total Assistance - Patient < 25%     Toileting Toileting    Toileting assist Assist for toileting: 2 Helpers     Transfers Chair/bed transfer  Transfers assist     Chair/bed transfer assist level: Total Assistance - Patient < 25%     Locomotion Ambulation   Ambulation assist   Ambulation activity did not occur: Safety/medical concerns          Walk 10 feet activity   Assist  Walk 10 feet activity did not occur: Safety/medical concerns        Walk 50 feet activity   Assist Walk 50 feet with 2 turns activity did not occur: Safety/medical concerns         Walk 150 feet activity   Assist Walk 150 feet activity did not occur: Safety/medical concerns         Walk 10 feet on uneven surface  activity   Assist Walk 10 feet on uneven surfaces activity did not occur: Safety/medical concerns         Wheelchair     Assist Is the patient using a wheelchair?: Yes Type of Wheelchair: Manual    Wheelchair assist level: Maximal Assistance - Patient 25 - 49% Max wheelchair distance: 75f    Wheelchair 50 feet with 2 turns activity    Assist        Assist Level: Total Assistance - Patient < 25%   Wheelchair 150 feet activity     Assist      Assist Level: Total Assistance - Patient < 25%   Blood pressure (!) 172/90, pulse 71, temperature 98.6 F (37 C), temperature source Oral, resp. rate 16, height  5' 2"$  (1.575 m), weight 64.7 kg, SpO2 100 %.  Medical Problem List and Plan: 1. 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  Impaired mobility: daughter notes mom does not tolerate lovenox, d/ced, SCDs ordered.            continue Aspirin 81 mg daily, plavix for 3 weeks.  3. Pain: N/A. Oxycodone discontinued. 4. Insomnia: resolved. Trazodone and zoloft d/ced 5. Neuropsych/cognition: This patient is? capable of making decisions on her own behalf. 6. Skin/Wound Care: Routine skin checks  -Protective cream to b/l heels 7. Fluids/Electrolytes/Nutrition: Routine in and outs with follow-up chemistries 8.  Dysphagia.  Downgraded to D1/nectar, discussed with SLP and daughter. Reviewed daughter's note that swallowing is improved 9.  Hypertension.  Hydralazine 25 mg every 8 hours.  Losartan recently held due to AKI, but restarted at 76m BID. Daughter prefers slow reduction of BP- at baseline she runs [id number]   Hydralazine changed to PRN for BP >180 as per daughter's request, losartan 222mrestarted, increased to BID, daughter reports she had allergy to multiple other BP medications, she will bring a list later time  10.  Acute urinary retention.  d/c flomax. Resolved.  11.  AKI on CKD stage III.  Baseline creatinine 1.9-2.0.  Cr reviewed from 2/13 and is better than baseline, repeat on Monday.  12.  Diabetes mellitus.  Hemoglobin A1c 7.8.  SSI.  Previously patient had declined initiation of any diabetic medications per  documentation.  Will follow-up with family. Placed order for no juice. Provided dietary education 13.  History of right ankle fracture.  Patient does wear an ankle stabilizing brace due to previous ankle fx. . 14.  Hyperlipidemia.  Zetia/fenofibrate d/ced as per daughter's preference 15.  Constipation.  Miralax d/ced as per daughter's preference. Senokot 2 tabs at bedtime as needed.  Sorbitol caused side effects.   -2/10 reports improved after a few BM  2/11 LBM today 16. 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 Kpad ordered. Discuss oral spasticity medications 17. Suboptimal vitamin D: continue ergocalciferol 50,000U once per week for 7 weeks, discussed that this can help with fatigue 18. Poor PO fluid intake  -IVF overnight today [id number] 19. Fluctuating strength: MRI brain and EEG ordered 20. Fatigue: discussed amantadine and modafinil 21. HTN: increase losartan to 33m102m 22. New acute right medullary infarct : continue aspirin/plavix 21 days from 2/13      LOS: 12 days A FACE TO FACE EVALUATION WAS PERFORMED  KrutClide Deutscherlkar 08/25/2022, 3:40 PM

## 2022-08-25 NOTE — Progress Notes (Signed)
Speech Language Pathology Daily Session Note  Patient Details  Name: [name] MRN: [id number] Date of Birth: 1945-08-20  Today's Date: 08/25/2022 SLP Individual Time: [id number] SLP Individual Time Calculation (min): 40 min SLP Individual Time:  1300-1330 SLP Individual Time Calculation (min):  30 min  Short Term Goals: Week 2: SLP Short Term Goal 1 (Week 2): STG's = LTG's due to ELOS  Skilled Therapeutic Interventions:  Session 1: Pt was seen for skilled ST targeting goals for dysphagia.  Upon arrival, pt was seated upright in wheelchair, awake, alert, and agreeable to participating in treatment.  SLP facilitated the session with a functional snack of dys 1, nectar thick liquids to monitor toleration of currently prescribed diet in light of most recent aspiration event with nursing yesterday.  Pt reports no changes in breathing, no congestion, and is afebrile per chart review.  She reports no changes in her ability to tolerate meals.  Pt utilized swallowing precautions with min assist-supervision during today's snack with no overt s/s of aspiration with purees or liquids.  Oral care with suction toothette completed after snack with minimal residue noted in the oral cavity. Pt was left in wheelchair with all needs within reach.  Continue per current plan of care.    Session 2: Pt was seen for skilled ST targeting goals for dysphagia.  Initial plans were for SLP to address speech intelligibility goals this session but pt and daughter reported that pt had not eaten lunch yet due to timing of lunch's arrival with regular nursing care.  Pt expressing she wanted to eat.  Pt needed increased cues for use of swallowing precautions during lunch in comparison to AM snack, up to max assist at times, due to increased impulsivity of PO intake.  Pt had x1 explosive cough which SLP suspects to be related to pt taking 2 or more bites of food before completely swallowing.  S/s of aspiration were mitigated   with cues for pt to put her fork down periodically as an external reminder to slow down and completely swallow her food before taking another bite.  Pt was left with daughter at bedside at the end of today's therapy session.  Continue per current plan of care.    Pain Session 1:  Pain Assessment Pain Scale: 0-10 Pain Score: 0-No pain  Session 2:   Pain Assessment Pain Scale: 0-10 Pain Score: 0- No pain  Therapy/Group: Individual Therapy  Markeis Allman, Selinda Orion 08/25/2022, 9:41 AM

## 2022-08-25 NOTE — Consult Note (Signed)
Consultation Note Date: 08/25/2022   Patient Name: [name]  DOB: 1945/10/29  MRN: [id number]  Age / Sex: 77 y.o., female  PCP: [name] Chandler, NP Referring Physician: Izora Ribas, MD  Reason for Consultation: Establishing goals of care  HPI/Patient Profile: 77 y.o. 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   Clinical Assessment and Goals of Care: Consult received and extensive chart review completed. I met today at [name]'s bedside along with daughter, [name]. [name] is working with SLP to eat her lunch. She appears much more awake today and improved from her concerning status noted yesterday.   [name] and I spoke privately. We discussed role of palliative care for ongoing support and conversation to ensure that [name]'s care aligns with her values and wishes. We discussed ongoing goals of optimizing quality of life while also preparing and knowing her wishes if she were to decline. [name] shares that they are a family of faith. She knows that her mother would not want to be kept alive on machines or have a feeding tube. I discussed with [name] my recommendation to put in place DNR and continue with ongoing therapy to optimize. I provided [name] with Hard Choices booklet, MOST form, and DNR form. We reviewed potential options from CIR and dependent on care needs. Family seem inclined to take her home and although this comes with challenges and much stress I shared that most patients wish to be at home and are happier at home if this is a realistic option. We will  continue to review resources and care needs when closer to time of discharge. We will have ongoing conversations and potential family meeting in the near future. I will follow up again tomorrow to continue conversation.   All questions/concerns addressed. Emotional support provided.   Primary Decision Maker PATIENT    SUMMARY OF RECOMMENDATIONS   - Ongoing goals of care conversations  Code Status/Advance Care Planning: Full code - encouraged DNR based on discussion of wishes - to verify wishes tomorrow   Symptom Management:  Per CIR team.   Prognosis:  Overall prognosis concerning with significant functional decline. Time for outcomes.   Discharge Planning: To Be Determined      Primary Diagnoses: Present on Admission:  Lateral medullary syndrome  Left hemiparesis ([name])   I have reviewed the medical record, interviewed the patient and family, and examined the patient. The following aspects are pertinent.  Past Medical History:  Diagnosis Date   Allergy    Arthritis    Broken ankle    Cataract    Diabetes mellitus without complication ([place])    History of colonoscopy    Hypertension    Macular degeneration syndrome    with edema---being treated.    Stage 4  chronic kidney disease ([place])    Stroke ([place]) 2019   Social History   Socioeconomic History   Marital status: Legally Separated    Spouse name: [name]   Number of children: 2   Years of education: Highschool and 1 year of collwege in business    Highest education level: High school graduate  Occupational History   Not on file  Tobacco Use   Smoking status: Never   Smokeless tobacco: Never  Vaping Use   Vaping Use: Never used  Substance and Sexual Activity   Alcohol use: No   Drug use: Never   Sexual activity: Not Currently  Other Topics Concern   Not on file  Social History Narrative   Tobacco use, amount per day now: None/Never Smoked   Past tobacco use, amount per day:   How many years  did you use tobacco:   Alcohol use (drinks per week): Never    Diet: Carb modified.   Do you drink/eat things with caffeine: yes sometimes.   Marital status:   Single                               What year were you married?   Do you live in a house, apartment, assisted living, condo, trailer, etc.? House   Is it one or more stories? One   How many persons live in your home? Daughter   Do you have pets in your home?( please list) None.   Highest Level of education completed? Associates Degree   Current or past profession: [employer]    Do you exercise?   No                               Type and how often?   Do you have a living will? Yes   Do you have a DNR form?     No                              If not, do you want to discuss one?   Do you have signed POA/HPOA forms?  Yes                   If so, please bring to you appointment      Do you have any difficulty bathing or dressing yourself? Yes   Do you have any difficulty preparing food or eating? No   Do you have any difficulty managing your medications? No   Do you have any difficulty managing your finances? No   Do you have any difficulty affording your medications? No   Social Determinants of Health   Financial Resource Strain: Medium Risk (02/27/2018)   Overall Financial Resource Strain (CARDIA)    Difficulty of Paying Living Expenses: Somewhat hard  Food Insecurity: No Food Insecurity (08/07/2022)   Hunger Vital Sign    Worried About Running Out of Food in the Last Year: Never true    Ran Out of Food in the Last Year: Never true  Transportation Needs: No Transportation Needs (08/07/2022)   PRAPARE - Hydrologist (Medical): No    Lack of Transportation (Non-Medical): No  Physical Activity: Inactive (02/27/2018)   Exercise Vital Sign    Days of Exercise per Week: 0 days  Minutes of Exercise per Session: 0 min  Stress: No Stress Concern Present (02/27/2018)   [place]    Feeling of Stress : Not at all  Social Connections: Somewhat Isolated (02/27/2018)   Social Connection and Isolation Panel [NHANES]    Frequency of Communication with Friends and Family: More than three times a week    Frequency of Social Gatherings with Friends and Family: Twice a week    Attends Religious Services: Never    Marine scientist or Organizations: No    Attends Music therapist: Never    Marital Status: Married   Family History  Problem Relation Age of Onset   Kidney disease Mother    Congestive Heart Failure Father    COPD Father    COPD Brother    Diabetes Maternal Aunt    Scheduled Meds:  aspirin  81 mg Oral QHS   clopidogrel  75 mg Oral QHS   Gerhardt's butt cream   Topical Daily   leptospermum manuka honey  1 Application Topical Daily   losartan  25 mg Oral BID   nystatin  5 mL Oral QID   potassium chloride  40 mEq Oral Once   senna-docusate  1 tablet Oral QHS   Vitamin D (Ergocalciferol)  50,000 Units Oral Q Wed   Continuous Infusions: PRN Meds:.acetaminophen **OR** acetaminophen (TYLENOL) oral liquid 160 mg/5 mL **OR** acetaminophen, bisacodyl, hydrALAZINE, ipratropium-albuterol, melatonin, ondansetron Allergies  Allergen Reactions   Cephalexin Diarrhea and Nausea And Vomiting   Clonidine Anaphylaxis, Swelling and Other (See Comments)    Tongue swelling   Norvasc [Amlodipine Besylate] Swelling    Edema and TONGUE swelling    Lactose Intolerance (Gi) Diarrhea   Latex Other (See Comments)    Skin blisters   Lisinopril Cough   Morphine And Related Itching and Other (See Comments)    Throat itched and became scratchy   Tape Itching and Other (See Comments)    Some tapes irritate the skin and others don't (redness/itchiness)   Amlodipine Anxiety, Nausea Only and Swelling   Codeine Itching and Rash   Crestor [Rosuvastatin] Rash    Red and flushed in her face   Review of  Systems  Constitutional:  Positive for activity change, appetite change and fatigue.  Neurological:  Positive for weakness.    Physical Exam Vitals and nursing note reviewed.  Constitutional:      Appearance: She is ill-appearing.  Cardiovascular:     Rate and Rhythm: Normal rate.  Pulmonary:     Effort: No tachypnea, accessory muscle usage or respiratory distress.  Abdominal:     General: Abdomen is flat.  Neurological:     Mental Status: She is alert.     Comments: Able to track and follow simple commands; speech improved per family  Psychiatric:        Mood and Affect: Affect is flat.     Vital Signs: BP (!) 158/58 (BP Location: Right Arm)   Pulse 64   Temp 97.7 F (36.5 C) (Oral)   Resp 16   Ht '5\' 2"'$  (1.575 m)   Wt 64.7 kg   SpO2 98%   BMI 26.09 kg/m  Pain Scale: 0-10   Pain Score: 0-No pain   SpO2: SpO2: 98 % O2 Device:SpO2: 98 % O2 Flow Rate: .   IO: Intake/output summary:  Intake/Output Summary (Last 24 hours) at 08/25/2022 1227 Last data filed at 08/25/2022 [id number] Gross per 24  hour  Intake 815 ml  Output --  Net 815 ml    LBM: Last BM Date : 08/24/22 Baseline Weight: Weight: 66.2 kg Most recent weight: Weight: 64.7 kg     Palliative Assessment/Data:     Time In: 1225  Time Total: 60 min  Greater than 50%  of this time was spent counseling and coordinating care related to the above assessment and plan.  Signed by: Vinie Sill, NP Palliative Medicine Team Pager # [phone] (M-F 8a-5p) Team Phone # [phone] (Nights/Weekends)

## 2022-08-25 NOTE — Progress Notes (Signed)
Occupational Therapy Session Note  Patient Details  Name: [name] MRN: [id number] Date of Birth: Oct 30, 1945  Today's Date: 08/25/2022 OT Individual Time: [id number] OT Individual Time Calculation (min): 53 min    Short Term Goals: Week 2:  OT Short Term Goal 1 (Week 2): Pt will transfer to [facility] wiht +1 A and LRAD OT Short Term Goal 2 (Week 2): Pt will don shirt with S OT Short Term Goal 3 (Week 2): Pt will bridge hips in bed with MIN A to improve BOC wiht clothing managment OT Short Term Goal 4 (Week 2): pt will thread 1LE into pants wiht AE PRN  Skilled Therapeutic Interventions/Progress Updates:  Pt received resting in bed for skilled OT session with focus on BADL retraining, sitting balance, bed mobility, and gentle LUE PROM. Pt agreeable to interventions, demonstrating overall pleasant mood. Pt with no reports of pain. OT offering intermediate rest breaks and positioning suggestions throughout session to address potential pain/fatigue and maximize participation/safety in session.    Pt initiates supine>EOB mobility requiring Mod-Max A to reach EOB sitting. Seated EOB, pt completes simple UB sponge-bathing with Min A for RUE, and overall Mod A +heavy multimodal cuing to maintain sitting balance. Pt tolerates position for ~7-8 mins before demonstrating increased trunk flexion and requiring supine rest.   Pt performs R/L rolling in bed to assist with doffing of LB garments, choosing to change brief but not perform LB sponge-bathing due to fatigue. Pt dependent for brief change, rolling with Mod-Max A.   Pt with wound on L-arm, noted to be bleeding through bandage at beginning of session, bandage changed, but bled through that one as well. Assigned nurse encouraging continuing to change bandages as needed, charge nurse made aware, assessing patient and administering care for skin tear.   OT facilitating gentle ROM at digits and wrist during nursing care. Pt and daughter educated on  purpose of resting hand-splint with donning explained. Planned for pt's daughter to don before leaving at end of day.   Pt remained resting in bed with all immediate needs met at end of session. Pt continues to be appropriate for skilled OT intervention to promote further functional independence.   Therapy Documentation Precautions:  Precautions Precautions: Fall Precaution Comments: premorbid L hemi Required Braces or Orthoses: Other Brace Other Brace: R ankle stabilizing brace from prior ankle fx Restrictions Weight Bearing Restrictions: Yes RLE Weight Bearing: Weight bearing as tolerated LLE Weight Bearing: Non weight bearing Other Position/Activity Restrictions: Old L sided weakness from a prior CVA from ~3 years ago. History of R abkle fracture ~1-1.5 yr ago   Therapy/Group: Individual Therapy  Maudie Mercury, OTR/L, MSOT  08/25/2022, 6:14 AM

## 2022-08-26 DIAGNOSIS — K5900 Constipation, unspecified: Secondary | ICD-10-CM

## 2022-08-26 DIAGNOSIS — G464 Cerebellar stroke syndrome: Secondary | ICD-10-CM

## 2022-08-26 DIAGNOSIS — N183 Chronic kidney disease, stage 3 unspecified: Secondary | ICD-10-CM

## 2022-08-26 DIAGNOSIS — E1169 Type 2 diabetes mellitus with other specified complication: Secondary | ICD-10-CM

## 2022-08-26 DIAGNOSIS — I10 Essential (primary) hypertension: Secondary | ICD-10-CM

## 2022-08-26 LAB — HEMOGLOBIN A1C
Hgb A1c MFr Bld: 7 % — ABNORMAL HIGH (ref 4.8–5.6)
Mean Plasma Glucose: 154.2 mg/dL

## 2022-08-26 LAB — GLUCOSE, CAPILLARY
Glucose-Capillary: 116 mg/dL — ABNORMAL HIGH (ref 70–99)
Glucose-Capillary: 148 mg/dL — ABNORMAL HIGH (ref 70–99)
Glucose-Capillary: 147 mg/dL — ABNORMAL HIGH (ref 70–99)

## 2022-08-26 LAB — LIPID PANEL
Cholesterol: 181 mg/dL (ref 0–200)
HDL: 33 mg/dL — ABNORMAL LOW (ref >40)
LDL Cholesterol: 121 mg/dL — ABNORMAL HIGH (ref 0–99)
Total CHOL/HDL Ratio: 5.5 RATIO
Triglycerides: 135 mg/dL (ref <150)
VLDL: 27 mg/dL (ref 0–40)

## 2022-08-26 NOTE — Progress Notes (Signed)
Physical Therapy Session Note  Patient Details  Name: [name] MRN: [id number] Date of Birth: 12-21-1945  Today's Date: 08/26/2022 PT Individual Time: [id number] PT Individual Time Calculation (min): 69 min   Short Term Goals: Week 2:  PT Short Term Goal 1 (Week 2): = LTG  Skilled Therapeutic Interventions/Progress Updates:     Pt sleeping in bed to start - awakens to voice and agreeable to PT tx. Denies pain. Assisted with donning shoes at [place] level for time. maxA needed for supine<>sitting EOB for BLE and trunk support. Assist for forward scooting to EOB to prepare for squat<>pivot transfer. Completes squat<>pivot transfer with maxA towards her stronger R side - allowed ++ time for initiation and processing. Transported to main rehab hallway to facilitate sit<>stands. Attempted to use hallway hand rail while facing to progress transfer but unable to safely stand without providing adequate assist from behind or to the side of her. Therefore, setup in standing frame and completed several dependent/totalA stands in frame with standing tasks (shooting basketball, playing Jenga, etc) to distract from endurance deficits. Patient relies quite heavily on forearm support in standing and used mirror to help with postural awareness/control to improve trunk extension and midline awareness. Noted pt to be incontinent of BM during standing tasks, likely occurred while straining to stand. Returned to her room and assisted to bed with mod/maxA squat<>pivot transfer. MaxA needed for returning to supine position. TotalA for pericare due to bowel incontinence. Daughter in room to assist with bed positioning and brief management during pericare. Patient boosted in bed, applied new brief, patient made comfortable. All needs met at end of session   Therapy Documentation Precautions:  Precautions Precautions: Fall Precaution Comments: premorbid L hemi Required Braces or Orthoses: Other Brace Other Brace:  R ankle stabilizing brace from prior ankle fx Restrictions Weight Bearing Restrictions: Yes RLE Weight Bearing: Weight bearing as tolerated LLE Weight Bearing: Non weight bearing Other Position/Activity Restrictions: Old L sided weakness from a prior CVA from ~3 years ago. History of R abkle fracture ~1-1.5 yr ago General:     Therapy/Group: Individual Therapy  Saketh Daubert P Sharran Caratachea PT 08/26/2022, 1:45 PM

## 2022-08-26 NOTE — Progress Notes (Signed)
Palliative:  Discussed with daughter, [name], and we agreed to meet together with her mother and sister tomorrow 08/27/22 1100 am. We will plan to review code status and MOST form as [name] has clear thoughts on what she does and does not want. [name] is working with therapy and [name] is pleased with her progress today. We discussed the conversation planned for tomorrow is advance care planning to document her wishes so that the care she receives in the event of health decline is what she would want.   No charge  Vinie Sill, NP Palliative Medicine Team Pager [phone] (Please see [email or web address] for schedule) Team Phone [phone]

## 2022-08-26 NOTE — Progress Notes (Signed)
PROGRESS NOTE   Subjective/Complaints: Pt with no new complaints this AM.  BP intermittently elevated.    ROS: +lethargy as per daughter-improved today, cognition intact as per team, denies pain, CP, SOB, abd pain, HA,  cough, right sided lean improved. +fatigue- improved, insomnia improved, +LUE spasticity, +right hand skin tear- stable   Objective:   DG Chest 2 View  Result Date: 08/24/2022 CLINICAL DATA:  Tachypnea EXAM: CHEST - 2 VIEW COMPARISON:  08/20/2022 FINDINGS: Frontal and lateral views of the chest demonstrate an unremarkable cardiac silhouette. Stable scarring, without acute airspace disease, effusion, or pneumothorax. No acute bony abnormalities. IMPRESSION: 1. No acute intrathoracic process. Electronically Signed   By: Randa Ngo M.D.   On: 08/24/2022 16:10   No results for input(s): "WBC", "HGB", "HCT", "PLT" in the last 72 hours.   No results for input(s): "NA", "K", "CL", "CO2", "GLUCOSE", "BUN", "CREATININE", "CALCIUM" in the last 72 hours.   Intake/Output Summary (Last 24 hours) at 08/26/2022 0835 Last data filed at 08/25/2022 1815 Gross per 24 hour  Intake 260 ml  Output --  Net 260 ml         Physical Exam: Vital Signs Blood pressure (!) 162/69, pulse 71, temperature 98.2 F (36.8 C), resp. rate 16, height 5' 2"$  (1.575 m), weight 64.7 kg, SpO2 97 %. Constitutional:      Comments: Frail appearing; awake, alert and awake, but very little speech; Sitting up in bed; daughter at bedside, NAD, BMI 26.09 HENT:     Head: Normocephalic and atraumatic.     Comments: L facial droop- from prior stroke, but worsened later in day Tongue midline. Swallowing better    Right Ear: External ear normal.     Left Ear: External ear normal.     Nose: Nose normal. No congestion.     Mouth/Throat:     Mouth: Mucous membranes are moist    Pharynx: Oropharynx is clear. No oropharyngeal exudate.  Eyes:     General:         Right eye: No discharge.        Left eye: No discharge.     Extraocular Movements: Extraocular movements intact.     Comments: Nystagmus to L>R  Cardiovascular:     Rate and Rhythm: Normal rate and regular rhythm.     Heart sounds: Normal heart sounds. No murmur heard.    No gallop.  Pulmonary:     Effort: No respiratory distress.     Breath sounds: Normal breath sounds. No wheezing, rhonchi or rales. Good air movement. Abdominal:     General:normoactive BS. There is no distension.     Palpations: Abdomen is soft.     Tenderness: There is no abdominal tenderness.  Musculoskeletal:     Cervical back: Neck supple. No tenderness.     Comments: LUE- biceps 4/5; trice 4/5 it appears; grip 2+/5; FA 2-/5 Has severe L elbow and wrist flexion spasticity- with forming contracture (last Botox 2+ years ago) LLE 5-/5 in HF,KE, 4-/5 DF and PF- forming R PF contracture RUE 5/5  RLE- 5/5  Facial droop Max A transfers Neurological:     Comments: Patient is alert and makes  eye contact with examiner.  Speech is mildly dysarthric but intelligible.  Follows simple commands.  Provides name and age. Finger to nose on R side ok, but cannot test L Denies dizziness Squat pivots Max A  Psychiatric:     Comments: Flat, decreased interaction  GU: voiding on own! Total A for peri hygiene Skin: IV in place  Assessment/Plan: 1. Functional deficits which require 3+ hours per day of interdisciplinary therapy in a comprehensive inpatient rehab setting. Physiatrist is providing close team supervision and 24 hour management of active medical problems listed below. Physiatrist and rehab team continue to assess barriers to discharge/monitor patient progress toward functional and medical goals  Care Tool:  Bathing    Body parts bathed by patient: Left arm, Chest, Abdomen, Right upper leg, Left upper leg, Face   Body parts bathed by helper: Right arm, Front perineal area, Buttocks, Right lower leg, Left  lower leg     Bathing assist Assist Level: Maximal Assistance - Patient 24 - 49%     Upper Body Dressing/Undressing Upper body dressing   What is the patient wearing?: Pull over shirt    Upper body assist Assist Level: Moderate Assistance - Patient 50 - 74%    Lower Body Dressing/Undressing Lower body dressing      What is the patient wearing?: Underwear/pull up, Pants     Lower body assist Assist for lower body dressing: Total Assistance - Patient < 25%     Toileting Toileting    Toileting assist Assist for toileting: 2 Helpers     Transfers Chair/bed transfer  Transfers assist     Chair/bed transfer assist level: Total Assistance - Patient < 25%     Locomotion Ambulation   Ambulation assist   Ambulation activity did not occur: Safety/medical concerns          Walk 10 feet activity   Assist  Walk 10 feet activity did not occur: Safety/medical concerns        Walk 50 feet activity   Assist Walk 50 feet with 2 turns activity did not occur: Safety/medical concerns         Walk 150 feet activity   Assist Walk 150 feet activity did not occur: Safety/medical concerns         Walk 10 feet on uneven surface  activity   Assist Walk 10 feet on uneven surfaces activity did not occur: Safety/medical concerns         Wheelchair     Assist Is the patient using a wheelchair?: Yes Type of Wheelchair: Manual    Wheelchair assist level: Maximal Assistance - Patient 25 - 49% Max wheelchair distance: 73f    Wheelchair 50 feet with 2 turns activity    Assist        Assist Level: Total Assistance - Patient < 25%   Wheelchair 150 feet activity     Assist      Assist Level: Total Assistance - Patient < 25%   Blood pressure (!) 162/69, pulse 71, temperature 98.2 F (36.8 C), resp. rate 16, height 5' 2"$  (1.575 m), weight 64.7 kg, SpO2 97 %.  Medical Problem List and Plan: 1. 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  Impaired mobility: daughter notes mom does not tolerate lovenox, d/ced, SCDs ordered.            continue Aspirin 81 mg daily, plavix for 3 weeks.  3. Pain: N/A. Oxycodone discontinued. 4. Insomnia: resolved. Trazodone and zoloft d/ced 5. Neuropsych/cognition: This patient is? capable of making decisions on her own behalf. 6. Skin/Wound Care: Routine skin checks  -Protective cream to b/l heels 7. Fluids/Electrolytes/Nutrition: Routine in and outs with follow-up chemistries 8.  Dysphagia.  Downgraded to D1/nectar, discussed with SLP and daughter. Reviewed daughter's note that swallowing is improved 9.  Hypertension.  Hydralazine 25 mg every 8 hours.  Losartan recently held due to AKI, but restarted at 10m BID. Daughter prefers slow reduction of BP- at baseline she runs [id number]   Hydralazine changed to PRN for BP >180 as per daughter's request, losartan 241mrestarted, increased to BID, daughter reports she had allergy to multiple other BP medications, she will bring a list later time  -2/19 Continue to monitor- consider further losartan increase  10.  Acute urinary retention.  d/c flomax. Resolved.  11.  AKI on CKD stage III.  Baseline creatinine 1.9-2.0.  Cr reviewed from 2/13 and is better than baseline, repeat on Monday.   -2/19 recheck tomorrow ordered 12.  Diabetes mellitus.  Hemoglobin A1c 7.8.  SSI.  Previously patient had declined initiation of any diabetic  medications per documentation.  Will follow-up with family. Placed order for no juice. Provided dietary education  2/19 CBGs fair control, continue to monitor 13.  History of right ankle fracture.  Patient does wear an ankle stabilizing brace due to previous ankle fx. . 14.  Hyperlipidemia.  Zetia/fenofibrate d/ced as per daughter's preference 15.  Constipation.  Miralax d/ced as per daughter's preference. Senokot 2 tabs at bedtime as needed.  Sorbitol caused side effects.   -2/10 reports improved after a few BM  2/19 LBM yesterday improved 16. 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 Kpad ordered. Discuss oral spasticity medications 17. Suboptimal vitamin D: continue ergocalciferol 50,000U once per week for 7 weeks, discussed that this can help with fatigue 18. Poor PO fluid intake  -IVF overnight today [id number] 19. Fluctuating strength: MRI brain and EEG ordered 20. Fatigue: discussed amantadine and modafinil 21. HTN: increase losartan to 14m20m 22. New acute right medullary infarct : continue aspirin/plavix 21 days from 2/13      LOS: 13 days A FACE TO FACE EVALUATION WAS PERFORMED  YuriJennye Boroughs9/2024, 8:35 AM

## 2022-08-26 NOTE — Plan of Care (Signed)
  Problem: RH BOWEL ELIMINATION Goal: RH STG MANAGE BOWEL WITH ASSISTANCE Description: STG Manage Bowel with mod I Assistance. Outcome: Not Progressing; incontinence   Problem: RH KNOWLEDGE DEFICIT Goal: RH STG INCREASE KNOWLEDGE OF DIABETES Description: Patient and dtr will be able to manage DM with medications and dietary modifications using educational resources independently Outcome: Progressing; daughter is very involved with patient's care   Problem: RH KNOWLEDGE DEFICIT Goal: RH STG INCREASE KNOWLEDGE OF HYPERTENSION Description: Patient and dtr will be able to manage HTN with medications and dietary modifications using educational resources independently Outcome: Progressing;

## 2022-08-26 NOTE — Progress Notes (Signed)
Physical Therapy Session Note  Patient Details  Name: [name] MRN: [id number] Date of Birth: 06-05-1946  Today's Date: 08/26/2022 PT Individual Time: 1000-1045 PT Individual Time Calculation (min): 45 min   Short Term Goals: Week 2:  PT Short Term Goal 1 (Week 2): = LTG  Skilled Therapeutic Interventions/Progress Updates:      Pt seen in w/c to start with daughter at bedside. Pt in agreement to therapy session. Briefly discussed general DC planning and custom wheelchair evaluation with the daughter. Daughter reports concerns regarding DC plan for Wednesday - she's hoping for an additional week stay in rehab. Will talk with care team.  Patient transported to main rehab gym. Assisted to mat table with mod/maxA squat<>pivot transfer to her R., Able to sit EOM with supervision for static sitting balance. Challenged endurance and balance with shooting basketball to lowered rim. Improved righting and protective responses during activity. Then worked on sit<>stands using stedy, placed sheet around buttock to help facilitate glut/hip extension. Patient requiring max/totalA for standing in the Stedy and standing tolerance ~20 seconds per stand, needing maxA for standing balance due to posterior and retrolean. Used Stedy to transfer her back to TIS w/c and needing maxA for posterior scooting and repositioning in w/c.   Returned to room and patient sitting in w/c at end of session, all needs met. Discussed with daughter patient's tolerance to therapy session.   Therapy Documentation Precautions:  Precautions Precautions: Fall Precaution Comments: premorbid L hemi Required Braces or Orthoses: Other Brace Other Brace: R ankle stabilizing brace from prior ankle fx Restrictions Weight Bearing Restrictions: Yes RLE Weight Bearing: Weight bearing as tolerated LLE Weight Bearing: Non weight bearing Other Position/Activity Restrictions: Old L sided weakness from a prior CVA from ~3 years ago.  History of R abkle fracture ~1-1.5 yr ago General:     Therapy/Group: Individual Therapy  [name] 08/26/2022, 7:30 AM

## 2022-08-26 NOTE — Progress Notes (Signed)
Occupational Therapy Session Note  Patient Details  Name: [name] MRN: [id number] Date of Birth: Nov 25, 1945  Today's Date: 08/26/2022 OT Individual Time: [id number] OT Individual Time Calculation (min): 60 min    Short Term Goals: Week 2:  OT Short Term Goal 1 (Week 2): Pt will transfer to [facility] wiht +1 A and LRAD OT Short Term Goal 2 (Week 2): Pt will don shirt with S OT Short Term Goal 3 (Week 2): Pt will bridge hips in bed with MIN A to improve BOC wiht clothing managment OT Short Term Goal 4 (Week 2): pt will thread 1LE into pants wiht AE PRN  Skilled Therapeutic Interventions/Progress Updates:    Patient up in wheelchair - daughter present at start of session, and indicated she assisted patient into clean pants this morning.  Patient assisted to sink, and allowed to wash face, chest and upper body and don clean shirt.  Patient transported to gym to address sit to stand and transfers. Worked on postural control and sitting alignment/ balance.  Facilitated more upright posture while seated edge of mat.  Worked on sit to stand transition.  Patient has difficulty extending trunk.   Placed back into wheelchair with slight recline to encourage head alignment.  Educated patient and daughter on alignment of trunk in wheelchair.  Patient left up in wheelchair with daughter at bedside and personal items in reach.   Therapy Documentation Precautions:  Precautions Precautions: Fall Precaution Comments: premorbid L hemi Required Braces or Orthoses: Other Brace Other Brace: R ankle stabilizing brace from prior ankle fx Restrictions Weight Bearing Restrictions: Yes RLE Weight Bearing: Weight bearing as tolerated LLE Weight Bearing: Non weight bearing Other Position/Activity Restrictions: Old L sided weakness from a prior CVA from ~3 years ago. History of R ankle fracture ~1-1.5 yr ago    Denies pain     Therapy/Group: Individual Therapy  Mariah Milling 08/26/2022, 12:43 PM

## 2022-08-26 NOTE — Progress Notes (Signed)
Speech Language Pathology Daily Session Note  Patient Details  Name: [name] MRN: [id number] Date of Birth: 1945/12/18  Today's Date: 08/26/2022 SLP Individual Time: 1445-1535 SLP Individual Time Calculation (min): 50 min  Short Term Goals: Week 2: SLP Short Term Goal 1 (Week 2): STG's = LTG's due to ELOS  Skilled Therapeutic Interventions: Skilled ST treatment focused on speech intelligibility goals. Pt was greeted semi-reclined in bed on arrival and accompanied by her daughter. Pt performed squat pivot transfer to w/c with max A. Transported to speech therapy office to facilitate implementation of speech intelligibility strategies with a verbal description task with mod fading to min A verbal cues to implement over articulation, pause between words, and increasing vocal intensity. With implementation of strategies, pt was perceived as ~85% intelligible at the phrase and sentence level. Without implementation, pt was perceived as 50-75% intelligible which faded as session progressed in setting of fatigue. Pt demonstrated average awareness of speech intelligibility but continued to require frequent cueing to address. Dtr reported pt has had difficulty allowing herself to rest while in the hospital. SLP provided pt with education on importance of resting to support overall brain health and recovery. Pt verbalized understanding through teach back. Patient was transferred back to bed and left with alarm activated and immediate needs within reach at end of session. Continue per current plan of care.      Pain  None/denied  Therapy/Group: Individual Therapy  Patty Sermons 08/26/2022, 3:40 PM

## 2022-08-26 NOTE — Progress Notes (Addendum)
Patient ID: [name], female   DOB: [birth year], 77 y.o.   MRN: [id number]  Met with pt and daughter-[name] to check in, both feel Wednesday is not long enough to work on transfers and allow her to progress in therapies. Therapy team feels extending to 2/28 to work on transfers and family education with daughter since she is committed to taking pt home at discharge. Make sure MD when returns tomorrow is on board with this. Daughter is very happy with this plan.

## 2022-08-27 ENCOUNTER — Ambulatory Visit: Payer: Medicare PPO | Admitting: Rehabilitation

## 2022-08-27 DIAGNOSIS — N183 Chronic kidney disease, stage 3 unspecified: Secondary | ICD-10-CM

## 2022-08-27 DIAGNOSIS — Z66 Do not resuscitate: Secondary | ICD-10-CM

## 2022-08-27 LAB — BASIC METABOLIC PANEL
Anion gap: 12 (ref 5–15)
BUN: 37 mg/dL — ABNORMAL HIGH (ref 8–23)
CO2: 20 mmol/L — ABNORMAL LOW (ref 22–32)
Calcium: 8.7 mg/dL — ABNORMAL LOW (ref 8.9–10.3)
Chloride: 108 mmol/L (ref 98–111)
Creatinine, Ser: 1.86 mg/dL — ABNORMAL HIGH (ref 0.44–1.00)
GFR, Estimated: 28 mL/min — ABNORMAL LOW (ref >60)
Glucose, Bld: 124 mg/dL — ABNORMAL HIGH (ref 70–99)
Potassium: 3.8 mmol/L (ref 3.5–5.1)
Sodium: 140 mmol/L (ref 135–145)

## 2022-08-27 LAB — CBC
HCT: 35.7 % — ABNORMAL LOW (ref 36.0–46.0)
Hemoglobin: 11.9 g/dL — ABNORMAL LOW (ref 12.0–15.0)
MCH: 27.4 pg (ref 26.0–34.0)
MCHC: 33.3 g/dL (ref 30.0–36.0)
MCV: 82.1 fL (ref 80.0–100.0)
Platelets: 245 10*3/uL (ref 150–400)
RBC: 4.35 MIL/uL (ref 3.87–5.11)
RDW: 15.9 % — ABNORMAL HIGH (ref 11.5–15.5)
WBC: 5.8 10*3/uL (ref 4.0–10.5)
nRBC: 0 % (ref 0.0–0.2)

## 2022-08-27 LAB — GLUCOSE, CAPILLARY: Glucose-Capillary: 115 mg/dL — ABNORMAL HIGH (ref 70–99)

## 2022-08-27 MED ORDER — SODIUM CHLORIDE 0.9 % IV SOLN: Freq: Every day | INTRAVENOUS | Status: DC

## 2022-08-27 NOTE — Progress Notes (Addendum)
Occupational Therapy Session Note  Patient Details  Name: [name] MRN: [id number] Date of Birth: 07-27-1945  Today's Date: 08/27/2022 OT Individual [id number] and  1100-1115 OT Individual Time Calculation (min): 42 min and 15 min    Short Term Goals: Week 2:  OT Short Term Goal 1 (Week 2): Pt will transfer to [facility] wiht +1 A and LRAD OT Short Term Goal 2 (Week 2): Pt will don shirt with S OT Short Term Goal 3 (Week 2): Pt will bridge hips in bed with MIN A to improve BOC wiht clothing managment OT Short Term Goal 4 (Week 2): pt will thread 1LE into pants wiht AE PRN  Skilled Therapeutic Interventions/Progress Updates:    1ST SESSION:  Patient received dressed and up in wheelchair. Transported patient to gym to address postural control, upright sitting, sit to stand and stand to sit transitions.  Patient showing improvement in ability to move toward thoracic extension - needing assistance for rotation of pelvis anteriorly.  Patient has difficulty sustaining this posture.  Worked today on maintaining "head up" alignment when preparing to transfer to reduce excessive trunk extension.  Patient with both passive stiffness and active tension in trunk that limit freedom to sufficiently flex at hips to off load from sitting to standing.  Improved since yesterday - focus then on improving active knee, hip, trunk extension for standing.  Patient returned to room - where daughter present.    2ND SESSION:  Patient seen in conjunction with primary PT and wheelchair rep to assess for wheelchair needs.  Daughter present, and discussed concerns and questions.  Patient asking questions regarding payment as well as timeline.     Therapy Documentation Precautions:  Precautions Precautions: Fall Precaution Comments: premorbid L hemi Required Braces or Orthoses: Other Brace Other Brace: R ankle stabilizing brace from prior ankle fx Restrictions Weight Bearing Restrictions: Yes RLE Weight Bearing:  Weight bearing as tolerated LLE Weight Bearing: Non weight bearing Other Position/Activity Restrictions: Old L sided weakness from a prior CVA from ~3 years ago. History of R abkle fracture ~1-1.5 yr ago  Pain:  Denies pain   Therapy/Group: Individual Therapy  Mariah Milling 08/27/2022, 12:23 PM

## 2022-08-27 NOTE — Plan of Care (Signed)
  Problem: RH BOWEL ELIMINATION Goal: RH STG MANAGE BOWEL WITH ASSISTANCE Description: STG Manage Bowel with mod I Assistance. Outcome: Not Progressing; incontinence   Problem: RH BLADDER ELIMINATION Goal: RH STG MANAGE BLADDER WITH ASSISTANCE Description: STG Manage Bladder With toileting Assistance Outcome: Not Progressing.; incontinence

## 2022-08-27 NOTE — Progress Notes (Signed)
Palliative:  HPI: 77 y.o. 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    I met today with [name] along with her 2 daughters [name] and Lou­za. [name] is sitting up in wheelchair and is speaking/communicating well and intake is good at this time. She is tolerating dysphagia 1 diet well and does not seem to mind the texture - she is enjoying her meals. We reviewed her goals of care and wishes. She very clearly elects DNR status. We completed MOST form: DNR, limited interventions, provide antibiotics, IVF for trial period, NO feeding tube. She desires quality of life over quantity. She has a strong faith. She wishes to die at home when her time comes. She would be okay to return to the hospital to have treatment and work up. She would not want to be in ICU hooked up to invasive life prolonging support.   [name] plans to take her mother home to care for her. This will be a lot to take on and a challenge to manage care at home. We will need to optimize resources to support them at home. She lives in [place], [place] so I did discuss with [facility] the support they can provide at home - they can provide RN 1-3 times per month and CSW visit once a month. This will be more support for them at home and can hopefully assist to optimize any other community resources for them through social work. I also spoke with family about hiring caregiver support to assist at home and they are looking into this option as well.   All questions/concerns addressed. Emotional support provided. Updated Dr. Ranell Patrick and CSW [employer].   Exam: Alert, oriented. No distress. Sitting in wheelchair - left leaning but  will correct when prompted. Breathing regular, unlabored. Abd soft.   Plan: - DNR decided - Most completed (see highlighted above) - Continue to optimize with therapy with plans to go home - please optimize resources in the home - Referral made to [facility] outpatient palliative program  40 min  Vinie Sill, NP Palliative Medicine Team Pager [phone] (Please see [email or web address] for schedule) Team Phone [phone]    Greater than 50%  of this time was spent counseling and coordinating care related to the above assessment and plan

## 2022-08-27 NOTE — Progress Notes (Signed)
Physical Therapy Session Note  Patient Details  Name: [name] MRN: [id number] Date of Birth: [birth year]  Today's Date: 08/27/2022 PT Individual Time: 1345-1442 PT Individual Time Calculation (min): 57 min   Short Term Goals: Week 2:  PT Short Term Goal 1 (Week 2): = LTG  Skilled Therapeutic Interventions/Progress Updates:      Pt seen in bed to start with her daughter at the bedside - pt in agreement without reports of pain. Mod/maxA overall for bed mobility for supine<>Sitting. Donned shoes with dependent assist - CGA while lifting her feet off of the ground due to posterior bias. Squat<>pivot transfer with mod/maxA to w/c, towards her R side - improved initiation. Transported to main rehab gym and assisted to mat table in similar manner. Focused remainder of session on initiating gait training. Used Clarise Cruz Plus to facilitate this and +2 assist for steering/managing [employer]. Pt ambulated 55f + 666f+ [id number] +2 assist using SarClarise Cruzus (without foot plate). Assist for lateral weight shifting, stabilizing trunk, and advancing RLE for ~50% of the time. No significant knee buckling but R foot significantly externally rotated. Extended seated rest breaks after each ambulation trial. Improved cadence, confidence, and weight shifting after each gait trial. Returned to her room and assisted to bed with modA squat<>pivot transfer. MaxA for returning to supine. Patient noted to be incontinent of BM - totalA for pericare at bed level. New brief applied. Patient boosted in bed, call bell in reach, alarm on.  Therapy Documentation Precautions:  Precautions Precautions: Fall Precaution Comments: premorbid L hemi Required Braces or Orthoses: Other Brace Other Brace: R ankle stabilizing brace from prior ankle fx Restrictions Weight Bearing Restrictions: Yes RLE Weight Bearing: Weight bearing as tolerated LLE Weight Bearing: Non weight bearing Other Position/Activity Restrictions: Old L sided  weakness from a prior CVA from ~3 years ago. History of R abkle fracture ~1-1.5 yr ago General:    Therapy/Group: Individual Therapy  ChrJone Basemannhard PT 08/27/2022, 12:39 PM

## 2022-08-27 NOTE — Progress Notes (Signed)
PROGRESS NOTE   Subjective/Complaints: Patient seen in gym, she has been having right ear numbness since she went down for her MRI- discussed perhaps from the sound of the MRI machine   ROS: +lethargy as per daughter-improved today, cognition intact as per team, denies pain, CP, SOB, abd pain, HA,  cough, right sided lean improved. +fatigue- improved, insomnia improved, +LUE spasticity, +right hand skin tear- stable, +right ear numbness   Objective:   No results found. Recent Labs    08/27/22 0515  WBC 5.8  HGB 11.9*  HCT 35.7*  PLT 245     Recent Labs    08/27/22 0515  NA 140  K 3.8  CL 108  CO2 20*  GLUCOSE 124*  BUN 37*  CREATININE 1.86*  CALCIUM 8.7*     Intake/Output Summary (Last 24 hours) at 08/27/2022 1031 Last data filed at 08/27/2022 0831 Gross per 24 hour  Intake 560 ml  Output --  Net 560 ml        Physical Exam: Vital Signs Blood pressure (!) 154/63, pulse 73, temperature 97.9 F (36.6 C), temperature source Oral, resp. rate 17, height 5' 2"$  (1.575 m), weight 64.7 kg, SpO2 100 %. Constitutional:      Comments: Frail appearing; awake, alert and awake, but very little speech; Sitting up in bed; daughter at bedside, NAD, BMI 26.09 HENT: No right ear deformities noted    Head: Normocephalic and atraumatic.     Comments: L facial droop- from prior stroke, but worsened later in day Tongue midline. Swallowing better    Right Ear: External ear normal.     Left Ear: External ear normal.     Nose: Nose normal. No congestion.     Mouth/Throat:     Mouth: Mucous membranes are moist    Pharynx: Oropharynx is clear. No oropharyngeal exudate.  Eyes:     General:        Right eye: No discharge.        Left eye: No discharge.     Extraocular Movements: Extraocular movements intact.     Comments: Nystagmus to L>R  Cardiovascular:     Rate and Rhythm: Normal rate and regular rhythm.     Heart  sounds: Normal heart sounds. No murmur heard.    No gallop.  Pulmonary:     Effort: No respiratory distress.     Breath sounds: Normal breath sounds. No wheezing, rhonchi or rales. Good air movement. Abdominal:     General:normoactive BS. There is no distension.     Palpations: Abdomen is soft.     Tenderness: There is no abdominal tenderness.  Musculoskeletal:     Cervical back: Neck supple. No tenderness.     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  Speech is mildly dysarthric but intelligible.  Follows simple commands.  Provides  name and age. Finger to nose on R side ok, but cannot test L Denies dizziness Squat pivots Max A  Psychiatric:     Comments: Flat, decreased interaction  GU: voiding on own! Total A for peri hygiene Skin: IV in place  Assessment/Plan: 1. Functional deficits which require 3+ hours per day of interdisciplinary therapy in a comprehensive inpatient rehab setting. Physiatrist is providing close team supervision and 24 hour management of active medical problems listed below. Physiatrist and rehab team continue to assess barriers to discharge/monitor patient progress toward functional and medical goals  Care Tool:  Bathing    Body parts bathed by patient: Left arm, Chest, Abdomen, Right upper leg, Left upper leg, Face   Body parts bathed by helper: Right arm, Front perineal area, Buttocks, Right lower leg, Left lower leg     Bathing assist Assist Level: Maximal Assistance - Patient 24 - 49%     Upper Body Dressing/Undressing Upper body dressing   What is the patient wearing?: Pull over shirt    Upper body assist Assist Level: Moderate  Assistance - Patient 50 - 74%    Lower Body Dressing/Undressing Lower body dressing      What is the patient wearing?: Underwear/pull up, Pants     Lower body assist Assist for lower body dressing: Total Assistance - Patient < 25%     Toileting Toileting    Toileting assist Assist for toileting: 2 Helpers     Transfers Chair/bed transfer  Transfers assist     Chair/bed transfer assist level: Total Assistance - Patient < 25%     Locomotion Ambulation   Ambulation assist   Ambulation activity did not occur: Safety/medical concerns          Walk 10 feet activity   Assist  Walk 10 feet activity did not occur: Safety/medical concerns        Walk 50 feet activity   Assist Walk 50 feet with 2 turns activity did not occur: Safety/medical concerns         Walk 150 feet activity   Assist Walk 150 feet activity did not occur: Safety/medical concerns         Walk 10 feet on uneven surface  activity   Assist Walk 10 feet on uneven surfaces activity did not occur: Safety/medical concerns         Wheelchair     Assist Is the patient using a wheelchair?: Yes Type of Wheelchair: Manual    Wheelchair assist level: Maximal Assistance - Patient 25 - 49% Max wheelchair distance: 20f    Wheelchair 50 feet with 2 turns activity    Assist        Assist Level: Total Assistance - Patient < 25%   Wheelchair 150 feet activity     Assist      Assist Level: Total Assistance - Patient < 25%   Blood pressure (!) 154/63, pulse 73, temperature 97.9 F (36.6 C), temperature source Oral, resp. rate 17, height 5' 2"$  (1.575 m), weight 64.7 kg, SpO2 100 %.  Medical Problem List and Plan: 1. 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  Impaired mobility: daughter notes mom does not tolerate lovenox, d/ced, SCDs ordered.            continue Aspirin 81 mg daily, plavix for 3 weeks.  3. Pain: N/A. Oxycodone discontinued. 4. Insomnia: resolved. Trazodone and zoloft d/ced 5. Neuropsych/cognition: This patient is? capable of making decisions on her own behalf. 6. Skin/Wound Care: Routine skin checks  -Protective cream to b/l heels 7. Fluids/Electrolytes/Nutrition: Routine in and outs with follow-up chemistries 8.  Dysphagia.  Downgraded to D1/nectar, discussed with SLP and daughter. Reviewed daughter's note that swallowing is improved 9.  Hypertension.  Hydralazine 25 mg every 8 hours.  Losartan recently held due to AKI, but restarted at 92m BID. Daughter prefers slow reduction of BP- at baseline she runs [id number]   Hydralazine changed to PRN for BP >180 as per daughter's request, losartan 263mrestarted, increased to BID, daughter reports she had allergy to multiple other BP medications, she will bring a list later time  -2/19 Continue to monitor- consider further losartan increase  10.  Acute urinary retention.  d/c flomax. Resolved.  11.  AKI on CKD stage III.  Baseline creatinine 1.9-2.0.  Cr reviewed from 2/13 and is better than baseline, repeat on Monday.   -2/19 recheck tomorrow ordered 12.  Diabetes mellitus.  Hemoglobin A1c 7.8.  SSI.  Previously patient had declined initiation of any diabetic medications per documentation.  Will follow-up with family. Placed order for no juice. Provided dietary education. HgbA1c reviewed and improved to 7  2/19 CBGs fair control, continue to monitor 13.  History of right  ankle fracture.  Patient does wear an ankle stabilizing brace due to previous ankle fx. . 14.  Hyperlipidemia.  Zetia/fenofibrate d/ced as per daughter's preference 15.  Constipation.  Miralax d/ced as per daughter's preference. Senokot 2 tabs at bedtime as needed.  Sorbitol caused side effects.   -2/10 reports improved after a few BM  2/19 LBM yesterday improved 16. 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 Kpad ordered. Discuss oral spasticity medications 17. Suboptimal vitamin D: continue ergocalciferol 50,000U once per week for 7 weeks, discussed that this can help with fatigue 18. Anemia: repeat Hgb tomorrow.  19. CKD: restarted fluids at night 20. Fatigue: discussed amantadine and modafinil 21. HTN: increase losartan to 2580mD 22. New acute right medullary infarct : continue aspirin/plavix 21 days from 2/13 23. Right ear numbness: discussed could be secondary to MRI machine noise, will order outpatient ENT eval for her      LOS: 14 days A FACE TO FACE EVALUATION WAS PERFORMED  KruClide Deutscherulkar 08/27/2022, 10:31 AM

## 2022-08-27 NOTE — Progress Notes (Signed)
Physical Therapy Session Note  Patient Details  Name: [name] MRN: [id number] Date of Birth: 01/25/1946  Today's Date: 08/27/2022 PT Individual Time: 1130-1200 PT Individual Time Calculation (min): 30 min  and Today's Date: 08/27/2022 PT Co-Treatment Time: 1100-1130 PT Co-Treatment Time Calculation (min): 30 min  Short Term Goals: Week 2:  PT Short Term Goal 1 (Week 2): = LTG  Skilled Therapeutic Interventions/Progress Updates:      Pt sitting in TIS w/c to start - both daughters at the bedside. Pt in agreement to therapy session without reports of pain. Session focused on completing wheelchair evaluation with [employer]. 1st 30 minutes of session co-tx with OT to help establish wheelchair needs. Patient transported to day room rehab gym - assisted to mat table with squat pivot transfer, modA overall. Wheelchair measurements taken while sitting EOM, feet supported with stool to achieve hip/knee flexion at 90deg. Pt needing occasional cues to improve thoracic extension and forward weight shift due to tendency to lean posteriorly when she becomes fatigued. Wheelchair determined is: -Immunologist wheelchair (colored military green) -Jay 3 slight contour back [employer] air reduced profile cushion -Full length and flip back arm rests with 1/2 lap padded tray - Head rest -pneumatic air flat free tires -swing away and removable leg rests *Family and patient in agreement with recommendations  Pt assisted back to her w/c and returned to her room. Concluded session slightly tilted in manual TIS chair, all needs met at end of session.    Therapy Documentation Precautions:  Precautions Precautions: Fall Precaution Comments: premorbid L hemi Required Braces or Orthoses: Other Brace Other Brace: R ankle stabilizing brace from prior ankle fx Restrictions Weight Bearing Restrictions: Yes RLE Weight Bearing: Weight bearing as tolerated LLE Weight Bearing: Non weight  bearing Other Position/Activity Restrictions: Old L sided weakness from a prior CVA from ~3 years ago. History of R abkle fracture ~1-1.5 yr ago     Therapy/Group: Individual Therapy  Rebekah Sprinkle P Torrell Krutz PT 08/27/2022, 7:35 AM

## 2022-08-28 LAB — CBC WITH DIFFERENTIAL/PLATELET
Abs Immature Granulocytes: 0.02 10*3/uL (ref 0.00–0.07)
Basophils Absolute: 0.1 10*3/uL (ref 0.0–0.1)
Basophils Relative: 1 %
Eosinophils Absolute: 0.4 10*3/uL (ref 0.0–0.5)
Eosinophils Relative: 7 %
HCT: 35.8 % — ABNORMAL LOW (ref 36.0–46.0)
Hemoglobin: 12 g/dL (ref 12.0–15.0)
Immature Granulocytes: 0 %
Lymphocytes Relative: 23 %
Lymphs Abs: 1.5 10*3/uL (ref 0.7–4.0)
MCH: 27.6 pg (ref 26.0–34.0)
MCHC: 33.5 g/dL (ref 30.0–36.0)
MCV: 82.3 fL (ref 80.0–100.0)
Monocytes Absolute: 0.7 10*3/uL (ref 0.1–1.0)
Monocytes Relative: 11 %
Neutro Abs: 3.8 10*3/uL (ref 1.7–7.7)
Neutrophils Relative %: 58 %
Platelets: 252 10*3/uL (ref 150–400)
RBC: 4.35 MIL/uL (ref 3.87–5.11)
RDW: 15.8 % — ABNORMAL HIGH (ref 11.5–15.5)
WBC: 6.6 10*3/uL (ref 4.0–10.5)
nRBC: 0 % (ref 0.0–0.2)

## 2022-08-28 LAB — GLUCOSE, CAPILLARY
Glucose-Capillary: 117 mg/dL — ABNORMAL HIGH (ref 70–99)
Glucose-Capillary: 143 mg/dL — ABNORMAL HIGH (ref 70–99)
Glucose-Capillary: 168 mg/dL — ABNORMAL HIGH (ref 70–99)

## 2022-08-28 NOTE — Progress Notes (Signed)
Physical Therapy Session Note  Patient Details  Name: [name] MRN: [id number] Date of Birth: 11-27-1945  Today's Date: 08/28/2022 PT Individual Time: 1300-1357 PT Individual Time Calculation (min): 57 min   Short Term Goals: Week 2:  PT Short Term Goal 1 (Week 2): = LTG  Skilled Therapeutic Interventions/Progress Updates:      Pt in w/c to start - agreeable to therapy session without reports of pain. Transported to main rehab gym for time management. Patient with scheduled time to evaluate with Orthotist to help in determining updated bracing needs for her R ankle. Unfortunately, CPO not arriving for schedule time. Will reschedule.   Squat<>pivot transfer with mod/maxA towards her R side to mat table. Able to sit unsupported with supervision with improved upright posture and awareness. Worked on sit<>Stands using EVA walker and mirror to provide visual feedback. Needing elevated mat table to help with initiating standing. She initially needed maxA to boost to rise but this progressed to [address] [facility] with several cues. R ankle tends to cave inwards and genu varus of her knees as well. Unable to achieve full upright but better than she has. Completed several stands with standing for ~10 seconds per stand.   Assisted to Nustep with maxA squat<>pivot transfer. setupA needed for BLE. Completed 10 minutes at L3 resistance, using BLE and RUE. Patient needing occasional cueing for improving cadence and full ROM bilaterally. Decreased sustained attention with task. Assisted back to w/c with maxA squat<>pivot transfer. Returned to her room and assisted to bed as patient requesting to lie down to rest. MaxA needed for bed mobility. Patient requesting to be positioned in sidelying - pillows for comfort b/w knees, HOB flat, all needs met at end of session.   Therapy Documentation Precautions:  Precautions Precautions: Fall Precaution Comments: premorbid L hemi Required Braces or Orthoses: Other  Brace Other Brace: R ankle stabilizing brace from prior ankle fx Restrictions Weight Bearing Restrictions: Yes RLE Weight Bearing: Weight bearing as tolerated LLE Weight Bearing: Non weight bearing Other Position/Activity Restrictions: Old L sided weakness from a prior CVA from ~3 years ago. History of R abkle fracture ~1-1.5 yr ago General:     Therapy/Group: Individual Therapy  [name] P [name] PT 08/28/2022, 7:37 AM

## 2022-08-28 NOTE — Progress Notes (Addendum)
Speech Language Pathology Weekly Progress and Session Note  Patient Details  Name: [name] MRN: [id number] Date of Birth: September 06, 1945  Beginning of progress report period: August 21, 2022 End of progress report period: August 28, 2022  Today's Date: 08/28/2022 SLP Individual Time: 0815-0900 SLP Individual Time Calculation (min): 45 min  Short Term Goals: Week 2: SLP Short Term Goal 1 (Week 2): STG's = LTG's due to ELOS SLP Short Term Goal 1 - Progress (Week 2): Not met    New Short Term Goals: Week 3: SLP Short Term Goal 1 (Week 3): STG's = LTG's due to ELOS  Weekly Progress Updates: Pt has demonstrated minimal progress this past week. MBSS completed with recommendations for continuation of Dysphagia 1 textures and nectar-thick liquid via tsp only with strict adherence to aspiration precautions to include separating liquid and solids, cueing pt to "swallow, first," and encouraging slow intake. Speech intelligibility continues to be impacted; however, does appear to be improving at the word and short phrase level with use of compensatory speech intelligibility strategies. Continues to benefit from Mod-Max A for emergent awareness. Pt and family education ongoing.   Continue to recommend ST intervention during this CIR admission, as well as f/u ST intervention at next venue of care. Anticipate pt will d/c on her current diet; pt and family aware and in agreement.Continue per updated ST POC.  Intensity: Minumum of 1-2 x/day, 30 to 90 minutes Frequency: 3 to 5 out of 7 days Duration/Length of Stay: ELOS 2/28 Treatment/Interventions: Cognitive remediation/compensation;Dysphagia/aspiration precaution training;Other (comment);Patient/family education   Daily Session Skilled Therapeutic Interventions:     Pt seen this date for skilled ST intervention targeting swallowing and speech goals outlined in care plan. Pt received awake/alert and OOB in TIS; supportive daughter present.  Nectar-thick liquids present without spoon - provided re-education re: importance of following outlined diet recommendations and aspiration precautions (pt and daughter verbalized understanding; will continue to reinforce). Agreeable to intervention in hospital room. Of note, pt more alert and interactive this session, in comparison to previous sessions last week. Daughter left and returned at the end of today's session.  Today's session with emphasis on diet tolerance monitoring, compliance with recommended aspiration precautions, speech intelligibility, and initiation of EMST. Re: swallowing, SLP provided skilled observation of pt with current diet consistencies (pureed and nectar-thick liquids via tsp) given Min-Mod A for adherence to aspiration precautions; self-fed with Sup to Min A. Did well with verbal cue to "swallow first," prior to taking another bite and separating liquids from pureed textures (providing liquids and then finishing with all purees). Also responded well to gentle tactile cue to forearm. Pt exhibited immediate cough x 1 following several sips of nectar-thick liquid via spoon (oral holding) and x 1 with large bite of pureed texture. No other s/sx concerning for airway intrusion noted.  Ate 100% of AM meal and drank 237 mL of nectar-thick liquid in 45 minutes.  Speech intelligibility was noted to be ~80% at the phrase level with use of increased vocal intensity and pacing at the Sup A level. Intelligibility decreased to ~50% at the sentence level. Initiated EMST this date with pt tolerating lowest setting (5 cm H2O) - completed 2 sets of 5 repetitions with good effort and accuracy given model prompts and verbal cues from therapist. Provided rationale for initiation.   Provided skilled education to pt's daughter re: aspiration precautions and will plan to complete meal with pt and daughter in upcoming session(s) to reinforce recommendations; she verbalized understanding.  Pt left in  room and OOB in TIS with all safety measures activated, call bell within reach, and all immediate needs met. Daughter present and direct hand off to OT. Continue per current ST POC.  Pain No pain reported; NAD  Therapy/Group: Individual Therapy  Kasia Trego A Keedan Sample 08/28/2022, 11:01 AM

## 2022-08-28 NOTE — Progress Notes (Signed)
PROGRESS NOTE   Subjective/Complaints: No new complaints this morning Eating 100% pureed diet Nystatin d/ced as this seemed to trigger cough   ROS: +lethargy as per daughter-improved today, cognition intact as per team, denies pain, CP, SOB, abd pain, HA,  cough, right sided lean improved. +fatigue- improved, insomnia improved, +LUE spasticity, +right hand skin tear- stable, +right ear numbness-present since she got MRI   Objective:   No results found. Recent Labs    08/27/22 0515 08/28/22 0604  WBC 5.8 6.6  HGB 11.9* 12.0  HCT 35.7* 35.8*  PLT 245 252     Recent Labs    08/27/22 0515  NA 140  K 3.8  CL 108  CO2 20*  GLUCOSE 124*  BUN 37*  CREATININE 1.86*  CALCIUM 8.7*     Intake/Output Summary (Last 24 hours) at 08/28/2022 1117 Last data filed at 08/28/2022 0900 Gross per 24 hour  Intake 1034.47 ml  Output --  Net 1034.47 ml        Physical Exam: Vital Signs Blood pressure 135/63, pulse 91, temperature 98.3 F (36.8 C), resp. rate 18, height 5' 2"$  (1.575 m), weight 64.7 kg, SpO2 98 %. Constitutional:      Comments: Frail appearing; awake, alert and awake, but very little speech; Sitting up in bed; daughter at bedside, NAD, BMI 26.09 HENT: No right ear deformities noted    Head: Normocephalic and atraumatic.     Comments: L facial droop- from prior stroke, but worsened later in day Tongue midline. Swallowing better    Right Ear: External ear normal.     Left Ear: External ear normal.     Nose: Nose normal. No congestion.     Mouth/Throat:     Mouth: Mucous membranes are moist    Pharynx: Oropharynx is clear. No oropharyngeal exudate.  Eyes:     General:        Right eye: No discharge.        Left eye: No discharge.     Extraocular Movements: Extraocular movements intact.     Comments: Nystagmus to L>R  Cardiovascular:     Rate and Rhythm: Normal rate and regular rhythm.     Heart sounds:  Normal heart sounds. No murmur heard.    No gallop.  Pulmonary:     Effort: No respiratory distress.     Breath sounds: Normal breath sounds. No wheezing, rhonchi or rales. Good air movement. Abdominal:     General:normoactive BS. There is no distension.     Palpations: Abdomen is soft.     Tenderness: There is no abdominal tenderness.  Musculoskeletal:     Cervical back: Neck supple. No tenderness.     Comments: LUE- biceps 4/5; trice 4/5 it appears; grip 2+/5; FA 2-/5 Has severe L elbow and wrist flexion spasticity- with forming contracture (last Botox 2+ years ago) LLE 5-/5 in HF,KE, 4-/5 DF and PF- forming R PF contracture RUE 5/5  RLE- 5/5  Facial droop Mod/Max transfers Neurological:     Comments: Patient is alert and makes eye contact with examiner.  Speech is mildly dysarthric but intelligible.  Follows simple commands.  Provides name and age. Finger to  nose on R side ok, but cannot test L Denies dizziness Squat pivots Max A  Psychiatric:     Comments: Flat, decreased interaction  GU: voiding on own! Total A for peri hygiene Skin: IV in place  Assessment/Plan: 1. Functional deficits which require 3+ hours per day of interdisciplinary therapy in a comprehensive inpatient rehab setting. Physiatrist is providing close team supervision and 24 hour management of active medical problems listed below. Physiatrist and rehab team continue to assess barriers to discharge/monitor patient progress toward functional and medical goals  Care Tool:  Bathing    Body parts bathed by patient: Left arm, Chest, Abdomen, Right upper leg, Left upper leg, Face   Body parts bathed by helper: Right arm, Front perineal area, Buttocks, Right lower leg, Left lower leg     Bathing assist Assist Level: Maximal Assistance - Patient 24 - 49%     Upper Body Dressing/Undressing Upper body dressing   What is the patient wearing?: Pull over shirt    Upper body assist Assist Level: Moderate  Assistance - Patient 50 - 74%    Lower Body Dressing/Undressing Lower body dressing      What is the patient wearing?: Underwear/pull up, Pants     Lower body assist Assist for lower body dressing: Total Assistance - Patient < 25%     Toileting Toileting    Toileting assist Assist for toileting: 2 Helpers     Transfers Chair/bed transfer  Transfers assist     Chair/bed transfer assist level: Total Assistance - Patient < 25%     Locomotion Ambulation   Ambulation assist   Ambulation activity did not occur: Safety/medical concerns          Walk 10 feet activity   Assist  Walk 10 feet activity did not occur: Safety/medical concerns        Walk 50 feet activity   Assist Walk 50 feet with 2 turns activity did not occur: Safety/medical concerns         Walk 150 feet activity   Assist Walk 150 feet activity did not occur: Safety/medical concerns         Walk 10 feet on uneven surface  activity   Assist Walk 10 feet on uneven surfaces activity did not occur: Safety/medical concerns         Wheelchair     Assist Is the patient using a wheelchair?: Yes Type of Wheelchair: Manual    Wheelchair assist level: Maximal Assistance - Patient 25 - 49% Max wheelchair distance: 39f    Wheelchair 50 feet with 2 turns activity    Assist        Assist Level: Total Assistance - Patient < 25%   Wheelchair 150 feet activity     Assist      Assist Level: Total Assistance - Patient < 25%   Blood pressure 135/63, pulse 91, temperature 98.3 F (36.8 C), resp. rate 18, height 5' 2"$  (1.575 m), weight 64.7 kg, SpO2 98 %.  Medical Problem List and Plan: 1. 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  Impaired mobility: daughter notes mom does not tolerate lovenox, d/ced, SCDs ordered.            Continue Aspirin 81 mg daily, plavix for 3 weeks.  3. Pain: N/A. Oxycodone discontinued. 4. Insomnia: resolved. Trazodone and zoloft d/ced 5. Neuropsych/cognition: This patient is? capable of making decisions on her own behalf. 6. Skin/Wound Care: Routine skin checks  -Protective cream to b/l heels 7. Fluids/Electrolytes/Nutrition: Routine in and outs with follow-up chemistries 8.  Dysphagia.  Downgraded to D1/nectar, discussed with SLP and daughter. Reviewed daughter's note that swallowing is improved. Discussed D1/nectar thick options for home 9.  Hypertension.  Hydralazine 25 mg every 8 hours.  Losartan recently held due to AKI, but restarted at 15m BID. Daughter prefers slow reduction of BP- at baseline she runs [id number]   Hydralazine changed to PRN for BP >180 as per daughter's request, losartan 234mrestarted, increased to BID, daughter reports she had allergy to multiple other BP medications, she will bring a list later time 10.  Acute urinary retention.  d/c flomax. Resolved.  11.  AKI on CKD stage III.  Baseline creatinine 1.9-2.0.  Cr reviewed from 2/13 and is better than baseline, Discussed with daughter that most recent creatinine on 2/20 continues to be better than baseline  12.  Diabetes mellitus.  Hemoglobin A1c 7.0, discussed with daughter.  SSI d/ced.  Placed order for no juice. Provided dietary education.  13.  History of right ankle fracture.  Patient does wear an ankle stabilizing brace due to previous ankle fx. . 14.  Hyperlipidemia.  Zetia/fenofibrate d/ced as per daughter's  preference 15.  Constipation.  Miralax d/ced as per daughter's preference. Senokot 2 tabs at bedtime as needed.  Sorbitol caused side  16. 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 Kpad ordered. Discuss oral spasticity medications 17. Suboptimal vitamin D: continue ergocalciferol 50,000U once per week for 7 weeks, discussed that this can help with fatigue 18. Anemia: repeat Hgb tomorrow.  19. CKD: restarted fluids at night 20. Fatigue: discussed amantadine and modafinil 21. HTN: increase losartan to 2598mD 22. New acute right medullary infarct : continue aspirin/plavix 21 days from 2/13 23. Right ear numbness: discussed could be secondary to MRI machine noise, will order outpatient ENT eval for her      LOS: 15 days A FACE TO FACE EVALUATION WAS PERFORMED  KruMartha ClanRaulkar 08/28/2022, 11:17 AM

## 2022-08-28 NOTE — Progress Notes (Signed)
Occupational Therapy Weekly Progress Note  Patient Details  Name: [name] MRN: [id number] Date of Birth: Jul 26, 1945  Beginning of progress report period: August 21, 2022 End of progress report period: August 28, 2022  Today's Date: 08/28/2022 OT Individual Time: [id number] OT Individual Time Calculation (min): 43 min    Patient has met 2 of 4 short term goals.  Patient had minor setback with additional neurological event last week.  Patient's rehab stay has been extended x 1 week to account for lost time with medical event.    Patient continues to demonstrate the following deficits: muscle weakness and muscle joint tightness, impaired timing and sequencing, abnormal tone, unbalanced muscle activation, and decreased coordination, decreased midline orientation, decreased attention to left, and decreased motor planning, decreased initiation, decreased attention, decreased problem solving, and delayed processing, and decreased sitting balance, decreased standing balance, decreased postural control, hemiplegia, and decreased balance strategies and therefore will continue to benefit from skilled OT intervention to enhance overall performance with BADL and Reduce care partner burden.  Patient progressing toward long term goals..  Continue plan of care.  OT Short Term Goals Week 2:  OT Short Term Goal 1 (Week 2): Pt will transfer to [facility] with +1 A and LRAD OT Short Term Goal 1 - Progress (Week 2): Progressing toward goal OT Short Term Goal 2 (Week 2): Pt will don shirt with S OT Short Term Goal 2 - Progress (Week 2): Not met OT Short Term Goal 3 (Week 2): Pt will bridge hips in bed with MIN A to improve BOC with clothing managment OT Short Term Goal 3 - Progress (Week 2): Met OT Short Term Goal 4 (Week 2): pt will thread 1LE into pants with AE PRN OT Short Term Goal 4 - Progress (Week 2): Progressing toward goal Week 3:  OT Short Term Goal 1 (Week 3): Pt will transfer to [facility] with +1 A  and LRAD OT Short Term Goal 2 (Week 3): Patient will maintain standing or assist with bridging to allow one caregiver assist for toileting OT Short Term Goal 3 (Week 3): Patient will sit without support x several minutes in preparation for edge of bed to wheelchair transfer OT Short Term Goal 4 (Week 3): Patient will don a pull over shirt with cueing following set up  Skilled Therapeutic Interventions/Progress Updates:    Patient received seated in wheelchair finishing speech session.  Daughter present.  Patient agreeable to OT session although looking fatigued.  Daughter indicates this is a quiet time for her.  Patient transported to gym to address postural control and sitting balance, sit to stand transitions and transfers - squat pivot. Continue to work to improve trunk extension with hip flexion.  Patient has excessive trunk flexion in all conditions.  Worked on seated upright control, and weight shifting - especially toward right - patient with premature balance response toward rightward shifts in sitting and rolling  (left side older hemiplegia) Worked in sit to stand at grab bar.  Max assist initially - then min assist once standing - poor stand tolerance.     Therapy Documentation Precautions:  Precautions Precautions: Fall Precaution Comments: premorbid L hemi Required Braces or Orthoses: Other Brace Other Brace: R ankle stabilizing brace from prior ankle fx Restrictions Weight Bearing Restrictions: Yes RLE Weight Bearing: Weight bearing as tolerated LLE Weight Bearing: Non weight bearing Other Position/Activity Restrictions: Old L sided weakness from a prior CVA from ~3 years ago. History of R abkle fracture ~1-1.5 yr ago  Pain: Pain Assessment Pain Scale: 0-10 Pain Score: 0-No pain     Therapy/Group: Individual Therapy  Mariah Milling 08/28/2022, 12:07 PM

## 2022-08-28 NOTE — Progress Notes (Signed)
Patient ID: [name], female   DOB: Feb 20, 1946, 77 y.o.   MRN: [id number]  Met with pt and daughter-[name] to give team conference update and progress pt has made this week. She is doing better with her squat pivot transfers and sitting. Had wheelchair evaluation yesterday. Aware target discharge date is 2/28 and really glad was given the extension. Discussed starting off with home health and then transitioning to OP due to pt wants to get back in the pool for therapies and this is OP. Will continue to work on discharge needs.

## 2022-08-28 NOTE — Patient Care Conference (Signed)
Inpatient RehabilitationTeam Conference and Plan of Care Update Date: 08/28/2022   Time: 11:13 AM    Patient Name: [name]      Medical Record Number: [id number]  Date of Birth: [birth year] Sex: Female         Room/Bed: 4W19C/4W19C-01 Payor Info: Payor: [employer] MEDICARE / Plan: [employer] MEDICARE CHOICE PPO / Product Type: *No Product type* /    Admit Date/Time:  08/13/2022  1:48 PM  Primary Diagnosis:  Lateral medullary syndrome  Hospital Problems: Principal Problem:   Lateral medullary syndrome Active Problems:   Left hemiparesis (HCC)   Spasticity as late effect of cerebrovascular accident (CVA)    Expected Discharge Date: Expected Discharge Date: 09/04/22 (D/C date extended)  Team Members Present: Physician leading conference: Dr. Leeroy Cha Social Worker Present: Ovidio Kin, LCSW Nurse Present: Dorien Chihuahua, RN PT Present: Ginnie Smart, PT OT Present: Other (comment) Antony Salmon, OT) SLP Present: Helaine Chess, SLP     Current Status/Progress Goal Weekly Team Focus  Bowel/Bladder   Incontinent of B/B. (Daughter assists in changing pt when soiled. (with staff and by self))   Regain some Continence.   Toilet q2hrs while awake per order.    Swallow/Nutrition/ Hydration   D1 and NTL via tsp - Min to Mod A for adherence to aspiration precautions.   Min A  Diet tolerance    ADL's   max assist ADL, Max assist- transfers, min assist sitting balance   mod A overall   Postural control, Transfer training, transitional movement training, family education,    Mobility   maxA bed mobility, mod to maxA squat<>pivot transfers, maxA sit<>stand. W/c evaluation completed 2/20   modA at wheelchair level  Bed mobility, functional transfers, caregiver training, DC planning    Communication   Min-Mod A   Min A   speech intelligbility for functional communication and RMST    Safety/Cognition/ Behavioral Observations  Max A   Mod A for emergent awareness    emergent awareness    Pain   Mild pain to Bilateral knees, No PRNs requested, Kpad placed on knees and helping.   Pain <3/10   Assess Qshift and prn    Skin   Skin tear RUE. Errythema to bilateral heels and buttocks   Maintain skin integrity without new breakdown  Assess Qshift and prn      Discharge Planning:  Daughter here daily and participates in Mom's care-glad about the extension to 2/28. Palliative consult and now DNR. Focus family education with transfers this week   Team Discussion: Patient with numbness in right ear. Labs stable per MD. Progress limited by fatigue and delayed motor planning skills. Patient on target to meet rehab goals: Currently needs  *See Care Plan and progress notes for long and short-term goals.   Revisions to Treatment Plan:  Palliative Care consult DNR activation with  MOST form completion OP ENT referral  LOS extended to lessen burden of care with increased initiation Downgraded goals for SLP RMT initation Teaching Needs: Safety, medications, dietary modifications, transfers, toileting, cues for swallowing and separation of liquids/solids, etc.   Current Barriers to Discharge: Decreased caregiver support and Incontinence  Possible Resolutions to Barriers: Family education HH follow up services DME: Actuary W/C     Medical Summary Current Status: hypertension, chronic kidney disease, lateral medullary syndrome, type 2 diabetes, new acute mediallary infarct, HLD  Barriers to Discharge: Renal Insufficiency/Failure;Medical stability  Barriers to Discharge Comments: hypertension, chronic kidney disease, lateral medullary syndrome, type 2  diabetes, new acute mediallary infarct, HLD Possible Resolutions to [employer]: continue losartan 74m BID, [facility] twice per week, continue IVF at night, provided dietary education, HgbA1c reviewed with daughter   Continued Need for Acute Rehabilitation Level  of Care: The patient requires daily medical management by a physician with specialized training in physical medicine and rehabilitation for the following reasons: Direction of a multidisciplinary physical rehabilitation program to maximize functional independence : Yes Medical management of patient stability for increased activity during participation in an intensive rehabilitation regime.: Yes Analysis of laboratory values and/or radiology reports with any subsequent need for medication adjustment and/or medical intervention. : Yes   I attest that I was present, lead the team conference, and concur with the assessment and plan of the team.   SDorien ChihuahuaB 08/28/2022, 3:52 PM

## 2022-08-28 NOTE — Progress Notes (Addendum)
Physical Therapy Weekly Progress Note  Patient Details  Name: [name] MRN: [id number] Date of Birth: June 07, 1946  Beginning of progress report period: August 23, 2022 End of progress report period: August 28, 2022   [name] is making slow progress towards LTG of modA. Progression has been impacted by medical status, with recent finding (2/14) of new CVA in L medulla. Patient's LOS extended x1 due to decline and allow time to continue caregiver training and functional mobility training. Overall she's maxA for bed mobility, mod to maxA for squat pivot transfers (depending on surface height, direction, and fatigue level), supervision for static sitting balance with feet support, and is ambulating up to ~54f with +2 assist using the Sara Plus to facilitate upright and standing during gait. Continue to anticipate patient will not be a functional ambulator at DC - [place] - ordered through [employer]. Functional progress continues to be primarily limited by L sided weakness, cognitive impairments, delayed initiation and processing.  Patient continues to demonstrate the following deficits muscle weakness and muscle joint tightness, decreased cardiorespiratoy endurance, abnormal tone, unbalanced muscle activation, and motor apraxia, decreased attention to left and decreased motor planning, decreased initiation, decreased attention, decreased awareness, decreased problem solving, decreased safety awareness, and delayed processing, and decreased sitting balance, decreased standing balance, decreased postural control, hemiplegia, and decreased balance strategies and therefore will continue to benefit from skilled PT intervention to increase functional independence with mobility.  Patient progressing toward long term goals..  Continue plan of care.  PT Short Term Goals Week 3:  PT Short Term Goal 1 (Week 3): STG = LTG due to ELOS  Therapy  Documentation Precautions:  Precautions Precautions: Fall Precaution Comments: premorbid L hemi Required Braces or Orthoses: Other Brace Other Brace: R ankle stabilizing brace from prior ankle fx Restrictions Weight Bearing Restrictions: Yes RLE Weight Bearing: Weight bearing as tolerated LLE Weight Bearing: Non weight bearing Other Position/Activity Restrictions: Old L sided weakness from a prior CVA from ~3 years ago. History of R abkle fracture ~1-1.5 yr ago General:     Therapy/Group: Individual Therapy  Arieonna Medine P Melayna Robarts PT 08/28/2022, 12:12 PM

## 2022-08-29 ENCOUNTER — Ambulatory Visit: Payer: Medicare PPO | Admitting: Rehabilitation

## 2022-08-29 LAB — GLUCOSE, CAPILLARY
Glucose-Capillary: 113 mg/dL — ABNORMAL HIGH (ref 70–99)
Glucose-Capillary: 137 mg/dL — ABNORMAL HIGH (ref 70–99)
Glucose-Capillary: 185 mg/dL — ABNORMAL HIGH (ref 70–99)

## 2022-08-29 MED ORDER — CLOPIDOGREL BISULFATE 75 MG PO TABS
75.0000 mg | ORAL_TABLET | Freq: Every day | ORAL | Status: DC
  Administered 2022-08-29 – 2022-08-31 (×3): 75 mg via ORAL
  Filled 2022-08-29 (×4): qty 1

## 2022-08-29 MED ORDER — LOSARTAN POTASSIUM 50 MG PO TABS
25.0000 mg | ORAL_TABLET | Freq: Every day | ORAL | Status: DC | PRN
  Administered 2022-09-03: 25 mg via ORAL
  Filled 2022-08-29: qty 1

## 2022-08-29 MED ORDER — ASPIRIN 81 MG PO CHEW
81.0000 mg | CHEWABLE_TABLET | Freq: Every day | ORAL | Status: DC
  Administered 2022-08-29 – 2022-09-03 (×5): 81 mg via ORAL
  Filled 2022-08-29 (×6): qty 1

## 2022-08-29 NOTE — Progress Notes (Signed)
Occupational Therapy Session Note  Patient Details  Name: ASYA ENGELKES MRN: [id number] Date of Birth: September 26, 1945  Today's Date: 08/29/2022 OT Individual Time: 1015-1100 OT Individual Time Calculation (min): 45 min    Short Term Goals: Week 3:  OT Short Term Goal 1 (Week 3): Pt will transfer to [facility] with +1 A and LRAD OT Short Term Goal 2 (Week 3): Patient will maintain standing or assist with bridging to allow one caregiver assist for toileting OT Short Term Goal 3 (Week 3): Patient will sit without support x several minutes in preparation for edge of bed to wheelchair transfer OT Short Term Goal 4 (Week 3): Patient will don a pull over shirt with cueing following set up  Skilled Therapeutic Interventions/Progress Updates:     Pt received in bed with no pain  Therapeutic activity Pt completes horse shoe toss in unsupported sitting position with and Sup-CGA overall. Activity performed to improve dynamic balance and functional reach in mod ranges outside BOS in prep for BADLs/IADLs with BUE in prep for BADLs.  Sit to stand with recliner back in front of pt and pt grabbing BUE onto upper handles 3x with heavy MOD for first stand but decreasing initiation/strength with each consecutive stand. Facilitation of knee and hip extension with OT sitting beside L side.   Squat pivot transfers with mod-max A overall into and out of bed and EOM. Pt does better with closed chain transfers   Pt left at end of session in bed with exit alarm on, call light in reach and all needs met   Therapy Documentation Precautions:  Precautions Precautions: Fall Precaution Comments: premorbid L hemi Required Braces or Orthoses: Other Brace Other Brace: R ankle stabilizing brace from prior ankle fx Restrictions Weight Bearing Restrictions: Yes RLE Weight Bearing: Weight bearing as tolerated LLE Weight Bearing: Non weight bearing Other Position/Activity Restrictions: Old L sided weakness from a prior CVA  from ~3 years ago. History of R abkle fracture ~1-1.5 yr ago Therapy/Group: Individual Therapy  Tonny Branch 08/29/2022, 6:52 AM

## 2022-08-29 NOTE — Progress Notes (Signed)
Speech Language Pathology Daily Session Note  Patient Details  Name: [name] MRN: [id number] Date of Birth: Nov 24, 1945  Today's Date: 08/29/2022 SLP Individual Time: 1200-1224 SLP Individual Time Calculation (min): 24 min  Short Term Goals: Week 3: SLP Short Term Goal 1 (Week 3): STG's = LTG's due to ELOS  Skilled Therapeutic Interventions: Pt seen this date for skilled ST intervention targeting swallowing goals outlined in care plan. Pt received fatigued and lying supine in bed; reports "rough night" and feeling tired. Agreeable to intervention at bedside. Repositioned in bed with assistance from RN to optimize safety for PO intake, which pt is requesting.  SLP intervention with emphasis on use of safe swallowing strategies within the context of a meal. Pt implemented safe swallowing strategies of "swallow first"/slow rate given Min A, Min-Mod A as meal progressed likely due to increase in internal and external distractibility and decreased endurance. Only one coughing episode noted with subsequent sips of nectar-thick liquid via tsp. No additional s/sx concerning for airway intrusion noted with implementation of diet recommendations and safe swallowing strategies. Provided reinforcement for slow rate, which pt was appreciative of. Swallow function appears consistent with MBSS findings from last week. Continue to recommend current diet textures.     Pt left in room with all safety measures activated, call bell within reach, and all immediate needs met. NT present to provide supervision with PO intake; provided education of safe swallowing strategies. Continue per current ST POC.   Pain No pain reported; NAD  Therapy/Group: Individual Therapy  Khris Jansson A Aarthi Uyeno 08/29/2022, 1:50 PM

## 2022-08-29 NOTE — Progress Notes (Signed)
PROGRESS NOTE   Subjective/Complaints: Tolerated therapy this morning BP medication changed to Losartan prn from hydralazine due to patient's emesis in response to medication last night  ROS: +lethargy as per daughter-improved today, cognition intact as per team, denies pain, CP, SOB, abd pain, HA,  cough, right sided lean improved. +fatigue- improved, insomnia improved, +LUE spasticity, +right hand skin tear- stable, +right ear numbness-present since she got MRI, denies tinnitus   Objective:   No results found. Recent Labs    08/27/22 0515 08/28/22 0604  WBC 5.8 6.6  HGB 11.9* 12.0  HCT 35.7* 35.8*  PLT 245 252     Recent Labs    08/27/22 0515  NA 140  K 3.8  CL 108  CO2 20*  GLUCOSE 124*  BUN 37*  CREATININE 1.86*  CALCIUM 8.7*     Intake/Output Summary (Last 24 hours) at 08/29/2022 1117 Last data filed at 08/29/2022 0806 Gross per 24 hour  Intake 1040.38 ml  Output --  Net 1040.38 ml        Physical Exam: Vital Signs Blood pressure (!) 153/54, pulse 73, temperature 98 F (36.7 C), resp. rate 17, height 5' 2"$  (1.575 m), weight 64.7 kg, SpO2 97 %. Constitutional:      Comments: Frail appearing; awake, alert and awake, but very little speech; Sitting up in bed; daughter at bedside, NAD, BMI 26.09 HENT: No right ear deformities noted    Head: Normocephalic and atraumatic.     Comments: L facial droop- from prior stroke, but worsened later in day Tongue midline. Swallowing better    Right Ear: External ear normal.     Left Ear: External ear normal.     Nose: Nose normal. No congestion.     Mouth/Throat:     Mouth: Mucous membranes are moist    Pharynx: Oropharynx is clear. No oropharyngeal exudate.  Eyes:     General:        Right eye: No discharge.        Left eye: No discharge.     Extraocular Movements: Extraocular movements intact.     Comments: Nystagmus to L>R  Cardiovascular:     Rate  and Rhythm: Normal rate and regular rhythm.     Heart sounds: Normal heart sounds. No murmur heard.    No gallop.  Pulmonary:     Effort: No respiratory distress.     Breath sounds: Normal breath sounds. No wheezing, rhonchi or rales. Good air movement. Abdominal:     General:normoactive BS. There is no distension.     Palpations: Abdomen is soft.     Tenderness: There is no abdominal tenderness.  Musculoskeletal:     Cervical back: Neck supple. No tenderness.     Comments: LUE- biceps 4/5; trice 4/5 it appears; grip 2+/5; FA 2-/5 Has severe L elbow and wrist flexion spasticity- with forming contracture (last Botox 2+ years ago) LLE 5-/5 in HF,KE, 4-/5 DF and PF- forming R PF contracture RUE 5/5  RLE- 5/5  Facial droop Mod/Max transfers Poor standing tolerance Neurological:     Comments: Patient is alert and makes eye contact with examiner.  Speech is mildly dysarthric but intelligible.  Follows simple commands.  Provides name and age. Finger to nose on R side ok, but cannot test L Denies dizziness Squat pivots Max A  Psychiatric:     Comments: Flat, decreased interaction  GU: voiding on own! Total A for peri hygiene Skin: IV in place  Assessment/Plan: 1. Functional deficits which require 3+ hours per day of interdisciplinary therapy in a comprehensive inpatient rehab setting. Physiatrist is providing close team supervision and 24 hour management of active medical problems listed below. Physiatrist and rehab team continue to assess barriers to discharge/monitor patient progress toward functional and medical goals  Care Tool:  Bathing    Body parts bathed by patient: Left arm, Chest, Abdomen, Right upper leg, Left upper leg, Face   Body parts bathed by helper: Right arm, Front perineal area, Buttocks, Right lower leg, Left lower leg     Bathing assist Assist Level: Moderate Assistance - Patient 50 - 74%     Upper Body Dressing/Undressing Upper body dressing   What is  the patient wearing?: Pull over shirt    Upper body assist Assist Level: Moderate Assistance - Patient 50 - 74%    Lower Body Dressing/Undressing Lower body dressing      What is the patient wearing?: Underwear/pull up, Pants     Lower body assist Assist for lower body dressing: Maximal Assistance - Patient 25 - 49%     Toileting Toileting    Toileting assist Assist for toileting: 2 Helpers     Transfers Chair/bed transfer  Transfers assist     Chair/bed transfer assist level: Maximal Assistance - Patient 25 - 49%     Locomotion Ambulation   Ambulation assist   Ambulation activity did not occur: Safety/medical concerns  Assist level: 2 helpers Assistive device: Lite Gait Max distance: 34f   Walk 10 feet activity   Assist  Walk 10 feet activity did not occur: Safety/medical concerns  Assist level: 2 helpers Assistive device: Lite Gait   Walk 50 feet activity   Assist Walk 50 feet with 2 turns activity did not occur: Safety/medical concerns         Walk 150 feet activity   Assist Walk 150 feet activity did not occur: Safety/medical concerns         Walk 10 feet on uneven surface  activity   Assist Walk 10 feet on uneven surfaces activity did not occur: Safety/medical concerns         Wheelchair     Assist Is the patient using a wheelchair?: Yes Type of Wheelchair: Manual    Wheelchair assist level: Supervision/Verbal cueing Max wheelchair distance: 151f   Wheelchair 50 feet with 2 turns activity    Assist        Assist Level: Maximal Assistance - Patient 25 - 49%   Wheelchair 150 feet activity     Assist      Assist Level: Total Assistance - Patient < 25%   Blood pressure (!) 153/54, pulse 73, temperature 98 F (36.7 C), resp. rate 17, height 5' 2"$  (1.575 m), weight 64.7 kg, SpO2 97 %.  Medical Problem List and Plan: 1. Functional deficits secondary to left lateral medullary infarction likely  secondary to small vessel disease as well as history of CVA 2019 with left-sided residual weakness             -patient may  shower             -ELOS/Goals: 09/04/22,  modA, min A for communication,  swallow  Continue CIR, made 15/7  Discussed progress with team  Discussed estimated length of stay with daughter, discussed expected goals  Placed nursing order for patient to be taken to bathroom q2H while awake  Palliative care consulted 2.  Impaired mobility: daughter notes mom does not tolerate lovenox, d/ced, SCDs ordered.            Continue Aspirin 81 mg daily, plavix for 3 weeks.  3. Pain: N/A. Oxycodone discontinued. 4. Insomnia: resolved. Trazodone and zoloft d/ced 5. Neuropsych/cognition: This patient is? capable of making decisions on her own behalf. 6. Skin/Wound Care: Routine skin checks  -Protective cream to b/l heels 7. Fluids/Electrolytes/Nutrition: Routine in and outs with follow-up chemistries 8.  Dysphagia.  Downgraded to D1/nectar, discussed with SLP and daughter. Reviewed daughter's note that swallowing is improved. Discussed D1/nectar thick options for home 9.  Hypertension.  Hydralazine d/ced due to emesis as per daughter's request. Losartan recently held due to AKI, but restarted at 31m BID. Daughter prefers slow reduction of BP- at baseline she runs [id number] 10.  Acute urinary retention.  d/c flomax. Resolved.  11.  AKI on CKD stage III.  Baseline creatinine 1.9-2.0.  Cr reviewed from 2/13 and is better than baseline, Discussed with daughter that most recent creatinine on 2/20 continues to be better than baseline  12.  Diabetes mellitus.  Hemoglobin A1c 7.0, discussed with daughter.  CBGs being monitor and reviewed, appear stable. Placed order for no juice. Provided dietary education.  13.  History of right ankle fracture.  Patient does wear an ankle stabilizing brace due to previous ankle fx. . 14.  Hyperlipidemia.  Zetia/fenofibrate d/ced as per daughter's  preference. Lipid panel repeated and reviewed with daughter.  15.  Constipation.  Miralax d/ced as per daughter's preference. Senokot 2 tabs at bedtime as needed.  Sorbitol caused side  16. 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 Kpad ordered. Discuss oral spasticity medications 17. Suboptimal vitamin D: continue ergocalciferol 50,000U once per week for 7 weeks, discussed that this can help with fatigue 18. Anemia: repeat Hgb tomorrow.  19. CKD: restarted fluids at night 20. Fatigue: discussed amantadine and modafinil 21. HTN: increase losartan to 220mID 22. New acute right medullary infarct : continue aspirin/plavix 21 days from 2/13 23. Right ear numbness: discussed could be secondary to MRI machine noise, will order outpatient ENT eval for her      LOS: 16 days A FACE TO FACE EVALUATION WAS PERFORMED  KrMartha Clan Elodia Haviland 08/29/2022, 11:17 AM

## 2022-08-29 NOTE — Plan of Care (Signed)
  Problem: RH BOWEL ELIMINATION Goal: RH STG MANAGE BOWEL WITH ASSISTANCE Description: STG Manage Bowel with mod I Assistance. Outcome: Not Progressing; incontinence   Problem: RH BLADDER ELIMINATION Goal: RH STG MANAGE BLADDER WITH ASSISTANCE Description: STG Manage Bladder With toileting Assistance Outcome: Not Progressing; incontinence

## 2022-08-29 NOTE — Progress Notes (Signed)
Pt received dose of hydralazine due to BP of 190/78 at 1541, 1741 BP at 164/82. Pt had nausea, increased saliva production requiring a lot of oral suctioning, and over all just didn't feel well. Pt stated she felt "much better" after vomiting moderate amount. All hs meds not given per daughters request. Pt was able to sleep fairly well afterwards. Morning BP 127/87. Per daughters request ASA and Plavix rescheduled to 0800.

## 2022-08-29 NOTE — Progress Notes (Signed)
Physical Therapy Session Note  Patient Details  Name: [name] MRN: [id number] Date of Birth: [birth year]  Today's Date: 08/29/2022 PT Individual Time: 0800-0857 PT Individual Time Calculation (min): 57 min   Short Term Goals: Week 3:  PT Short Term Goal 1 (Week 3): STG = LTG due to ELOS  Skilled Therapeutic Interventions/Progress Updates:      Pt sitting in w/c to start - in agreement to therapy session. Denies pain.   Transported to day room rehab gym. Focused session on standing and gait training using LiteGait overground. Harness donned/doffed in sitting for safety. Patient needing multiple stands (maxA) to adjust harness. Patient requires maxA to stand using the LiteGait with significant BWS through harness. Assist for forward weight shifting due to "sitting" in harness with posterior lean. At first, we just worked on standing tolerance, posture, awareness, and weight bearing through LE's.  Gait training in [place] overground with significant BWS through harness - 3x57f with seated rest breaks. PT facilitating hip/trunk extension and assist for advancing RLE in swing ~50% of the time.   Returned to her room and concluded session in TIS w/c - daughter updated at bedside. All needs met.    Therapy Documentation Precautions:  Precautions Precautions: Fall Precaution Comments: premorbid L hemi Required Braces or Orthoses: Other Brace Other Brace: R ankle stabilizing brace from prior ankle fx Restrictions Weight Bearing Restrictions: Yes RLE Weight Bearing: Weight bearing as tolerated LLE Weight Bearing: Non weight bearing Other Position/Activity Restrictions: Old L sided weakness from a prior CVA from ~3 years ago. History of R abkle fracture ~1-1.5 yr ago General:      Therapy/Group: Individual Therapy  Tymier Lindholm P Eddy Liszewski PT 08/29/2022, 8:19 AM

## 2022-08-30 LAB — GLUCOSE, CAPILLARY
Glucose-Capillary: 133 mg/dL — ABNORMAL HIGH (ref 70–99)
Glucose-Capillary: 159 mg/dL — ABNORMAL HIGH (ref 70–99)
Glucose-Capillary: 175 mg/dL — ABNORMAL HIGH (ref 70–99)
Glucose-Capillary: 219 mg/dL — ABNORMAL HIGH (ref 70–99)

## 2022-08-30 MED ORDER — NEOMYCIN-POLYMYXIN-HC 3.5-10000-1 OT SUSP
3.0000 [drp] | Freq: Three times a day (TID) | OTIC | Status: DC
  Administered 2022-08-30 – 2022-08-31 (×3): 3 [drp] via OTIC
  Filled 2022-08-30: qty 10

## 2022-08-30 NOTE — Progress Notes (Addendum)
Speech Language Pathology Daily Session Note  Patient Details  Name: [name] MRN: [id number] Date of Birth: 08/31/1945  Today's Date: 08/30/2022 SLP Individual Time: 1030-1105 SLP Individual Time Calculation (min): 35 min  Short Term Goals: Week 3: SLP Short Term Goal 1 (Week 3): STG's = LTG's due to ELOS  Skilled Therapeutic Interventions: Pt seen this date for skilled ST intervention targeting swallowing and speech goals outlined in care plan. Pt received awake/alert and OOB in TIS, combing her hair - recently finished with OT. Agreeable to intervention in hosiptal room. Informally, pt's vocal intensity and intelligibility appeared improved this session.  Today's session with emphasis on diet tolerance monitoring, safe swallowing training, EMST, and speech intelligibility.   With set-up assistance, pt self-fed nectar-thick liquid via tsp and pureed textures with only one instance of coughing with when she took 2 consecutive vs single sips of nectar-thick liquid via tsp. No additional s/sx concerning for airway intrusion noted when given intermittent Min A verbal cues to "swallow first" before taking her next bite/sip - intake rate and implementation of safe swallowing strategies does appear slightly improved from last week. To assess pt's readiness to upgrade liquid intake to cup vs  tsp, SLP trialed nectar-thick liquid via cup with pt exhibited improved tolerance and only one instance of coughing on ~2 oz. Will plan to continue trialing nectar-thick liquid via cup with SLP only - continue tsp outside of therapy - pt and daughter educated on plan and recommendations and verbalized understanding. Additionally, provided education re: thickening thin liquids to nectar-thick consistency with use of SimplyThick gel packets on floor and not using ice to cool beverages - she verbalized understanding.    EMST also targeted this date with pt completing 2 sets of 5 reps at 10 cm H2O with good  effort and accuracy given demonstration and verbal cues and rporeted this to feel "medium" in effort. Trialed 15 cm H2O with pt reporting this to feel "hard." Continue 10 cm H2O at this time.   Speech intelligibility noted to be ~80-85% at the word and phrase level this date with pt utilizing increased vocal intensity to assist in comprehensibility. Reinforced pt's efforts; pt appreciative.   Pt left in room and OOB in TIS with all safety measures activated and call bell within reach. Daughter at bedside. Continue per current ST POC.  Pain No pain reported; NAD  Therapy/Group: Individual Therapy  [name] 08/30/2022, 2:50 PM

## 2022-08-30 NOTE — Progress Notes (Signed)
Physical Therapy Session Note  Patient Details  Name: MELBA ZOPFI MRN: [id number] Date of Birth: 29-Oct-1945  Today's Date: 08/30/2022 PT Individual Time: 1300-1400 PT Individual Time Calculation (min): 60 min   Short Term Goals: Week 3:  PT Short Term Goal 1 (Week 3): STG = LTG due to ELOS  Skilled Therapeutic Interventions/Progress Updates:      Pt sitting in TIS w/c to start with her daughter, Elberta Spaniel, present. Session focused on having Loaner TIS wheelchair delivered to room for fitting and for [facility] assessing R ankle brace [facility]) to determine POC and possibilities for alternatives.   Assisted patient w/ squat<>pivot transfer with max/totalA to her R side from hospital TIS w/c to her Loaner TIS w/c. Patient lacking adequate initiation in BLE during transfer and needing +2 assist for safely transition to sitting in w/c. W/c fitted and adjusted as needed to optimize sitting posture. Unfortunately, patient unable to reach the floor with the loaner wheelchair and therefore is unable to foot propel for w/c propulsion.   Discussed at length with daughter and [facility] on the [place]. Due to the severe ankle and knee valgus on her R side during weight bearing, unable to change her [place] boot to control lateral instability. Daughter voicing understanding and recommendation to continue using the [place] during any weight bearing activities.   Pt concluded session reclined in TIS w/c, all needs met.    Therapy Documentation Precautions:  Precautions Precautions: Fall Precaution Comments: premorbid L hemi Required Braces or Orthoses: Other Brace Other Brace: R ankle stabilizing brace from prior ankle fx Restrictions Weight Bearing Restrictions: Yes RLE Weight Bearing: Weight bearing as tolerated LLE Weight Bearing: Non weight bearing Other Position/Activity Restrictions: Old L sided weakness from a prior CVA from ~3 years ago. History of R abkle fracture ~1-1.5 yr  ago General:    Therapy/Group: Individual Therapy  Dodger Sinning P Yale Golla PT 08/30/2022, 7:36 AM

## 2022-08-30 NOTE — Progress Notes (Addendum)
PROGRESS NOTE   Subjective/Complaints: No new complaints this morning Tolerated therapy well this morning Still ha numbness in right ear and now pain as well    ROS: +lethargy as per daughter-improved today, cognition intact as per team, denies pain, CP, SOB, abd pain, HA,  cough, right sided lean improved. +fatigue- improved, insomnia improved, +LUE spasticity, +right hand skin tear- stable, +right ear numbness-present since she got MRI, now with pain as well, denies tinnitus   Objective:   No results found. Recent Labs    08/28/22 0604  WBC 6.6  HGB 12.0  HCT 35.8*  PLT 252     No results for input(s): "NA", "K", "CL", "CO2", "GLUCOSE", "BUN", "CREATININE", "CALCIUM" in the last 72 hours.    Intake/Output Summary (Last 24 hours) at 08/30/2022 1428 Last data filed at 08/29/2022 1735 Gross per 24 hour  Intake 236 ml  Output --  Net 236 ml        Physical Exam: Vital Signs Blood pressure (!) 162/63, pulse 74, temperature 97.8 F (36.6 C), resp. rate 16, height '5\' 2"'$  (1.575 m), weight 64.7 kg, SpO2 98 %. Constitutional:      Comments: Frail appearing; awake, alert and awake, but very little speech; Sitting up in bed; daughter at bedside, NAD, BMI 26.09 HENT: No right ear deformities noted    Head: Normocephalic and atraumatic.     Comments: L facial droop- from prior stroke, but worsened later in day Tongue midline. Swallowing better    Right Ear: External ear normal.     Left Ear: External ear normal.     Nose: Nose normal. No congestion.     Mouth/Throat:     Mouth: Mucous membranes are moist    Pharynx: Oropharynx is clear. No oropharyngeal exudate.  Eyes:     General:        Right eye: No discharge.        Left eye: No discharge.     Extraocular Movements: Extraocular movements intact.     Comments: Nystagmus to L>R  Cardiovascular:     Rate and Rhythm: Normal rate and regular rhythm.     Heart  sounds: Normal heart sounds. No murmur heard.    No gallop.  Pulmonary:     Effort: No respiratory distress.     Breath sounds: Normal breath sounds. No wheezing, rhonchi or rales. Good air movement. Abdominal:     General:normoactive BS. There is no distension.     Palpations: Abdomen is soft.     Tenderness: There is no abdominal tenderness.  Musculoskeletal:     Cervical back: Neck supple. No tenderness.     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  Speech is mildly dysarthric but intelligible.  Follows simple commands.  Provides name and age. Finger  to nose on R side ok, but cannot test L Denies dizziness Squat pivots Max A  Psychiatric:     Comments: Flat, decreased interaction  GU: voiding on own! Total A for peri hygiene Skin: IV in place  Assessment/Plan: 1. Functional deficits which require 3+ hours per day of interdisciplinary therapy in a comprehensive inpatient rehab setting. Physiatrist is providing close team supervision and 24 hour management of active medical problems listed below. Physiatrist and rehab team continue to assess barriers to discharge/monitor patient progress toward functional and medical goals  Care Tool:  Bathing    Body parts bathed by patient: Left arm, Chest, Abdomen, Right upper leg, Left upper leg, Face   Body parts bathed by helper: Right arm, Front perineal area, Buttocks, Right lower leg, Left lower leg     Bathing assist Assist Level: Moderate Assistance - Patient 50 - 74%     Upper Body Dressing/Undressing Upper body dressing   What is the patient wearing?:  Pull over shirt    Upper body assist Assist Level: Moderate Assistance - Patient 50 - 74%    Lower Body Dressing/Undressing Lower body dressing      What is the patient wearing?: Underwear/pull up, Pants     Lower body assist Assist for lower body dressing: Maximal Assistance - Patient 25 - 49%     Toileting Toileting    Toileting assist Assist for toileting: 2 Helpers     Transfers Chair/bed transfer  Transfers assist     Chair/bed transfer assist level: Maximal Assistance - Patient 25 - 49%     Locomotion Ambulation   Ambulation assist   Ambulation activity did not occur: Safety/medical concerns  Assist level: 2 helpers Assistive device: Lite Gait Max distance: 78f   Walk 10 feet activity   Assist  Walk 10 feet activity did not occur: Safety/medical concerns  Assist level: 2 helpers Assistive device: Lite Gait   Walk 50 feet activity   Assist Walk 50 feet with 2 turns activity did not occur: Safety/medical concerns         Walk 150 feet activity   Assist Walk 150 feet activity did not occur: Safety/medical concerns         Walk 10 feet on uneven surface  activity   Assist Walk 10 feet on uneven surfaces activity did not occur: Safety/medical concerns         Wheelchair     Assist Is the patient using a wheelchair?: Yes Type of Wheelchair: Manual    Wheelchair assist level: Supervision/Verbal cueing Max wheelchair distance: 150f   Wheelchair 50 feet with 2 turns activity    Assist        Assist Level: Maximal Assistance - Patient 25 - 49%   Wheelchair 150 feet activity     Assist      Assist Level: Total Assistance - Patient < 25%   Blood pressure (!) 162/63, pulse 74, temperature 97.8 F (36.6 C), resp. rate 16, height '5\' 2"'$  (1.575 m), weight 64.7 kg, SpO2 98 %.  Medical Problem List and Plan: 1. 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  Impaired mobility: daughter notes mom does not tolerate lovenox, d/ced, SCDs ordered.            Continue Aspirin 81 mg daily, plavix for 3 weeks.  3. Pain: N/A. Oxycodone discontinued. 4. Insomnia: resolved. Trazodone and zoloft d/ced 5. Neuropsych/cognition: This patient is? capable of making decisions on her own behalf. 6. Skin/Wound Care: Routine skin checks  -Protective cream to b/l heels 7. Fluids/Electrolytes/Nutrition: Routine in and outs with follow-up chemistries 8.  Dysphagia.  Downgraded to D1/nectar, discussed with SLP and daughter. Reviewed daughter's note that swallowing is improved. Discussed D1/nectar thick options for home, recommended smoothies as a nutritious option 9.  Hypertension.  Hydralazine d/ced due to emesis as per daughter's request. Losartan recently held due to AKI, but restarted at '25mg'$  BID. Additional '25mg'$  daily prn added for SBP >180. Daughter prefers slow reduction of BP- at baseline she runs [id number] systolic 10.  Acute urinary retention.  d/c flomax. Resolved.  11.  AKI on CKD stage III.  Baseline creatinine 1.9-2.0.  Cr reviewed from 2/13 and is better than baseline, Discussed with daughter that most recent creatinine on 2/20 continues to be better than baseline. Continue IVF over night- please renew nightly over the weekend 12.  Diabetes mellitus.  Hemoglobin A1c 7.0, discussed with daughter.  CBGs being monitor and reviewed, appear stable. Placed order for no juice. Provided dietary education.  13.  History of right ankle fracture.  Patient does wear  an ankle stabilizing brace due to previous ankle fx. . 14.  Hyperlipidemia.  Zetia/fenofibrate d/ced as per daughter's preference. Lipid panel repeated and reviewed with daughter.  15.  Constipation.  Miralax d/ced as per daughter's preference. Senokot 2 tabs at bedtime as needed.  Sorbitol caused side  16. 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 Kpad ordered. Discuss oral spasticity medications 17. Suboptimal vitamin D: continue ergocalciferol 50,000U once per week for 7 weeks, discussed that this can help with fatigue 18. Anemia: repeat Hgb tomorrow.  19. CKD: restarted fluids at night 20. Fatigue: discussed amantadine and modafinil 21. HTN: increase losartan to 59mBID 22. New acute right medullary infarct : continue aspirin/plavix 21 days from 2/13 23. Right ear numbness: discussed could be secondary to MRI machine noise, will order outpatient ENT eval for her 24. Right ear pain: will add Cortisporin ear drops 3 drops q8H.       LOS: 17 days A FACE TO FACE EVALUATION WAS PERFORMED  KClide DeutscherRaulkar 08/30/2022, 2:28 PM

## 2022-08-30 NOTE — Progress Notes (Signed)
Occupational Therapy Session Note  Patient Details  Name: [name] MRN: [id number] Date of Birth: 1946-03-29  Today's Date: 08/30/2022 OT Individual Time: 0935-1030 OT Individual Time Calculation (min): 55 min    Short Term Goals: Week 3:  OT Short Term Goal 1 (Week 3): Pt will transfer to [facility] with +1 A and LRAD OT Short Term Goal 2 (Week 3): Patient will maintain standing or assist with bridging to allow one caregiver assist for toileting OT Short Term Goal 3 (Week 3): Patient will sit without support x several minutes in preparation for edge of bed to wheelchair transfer OT Short Term Goal 4 (Week 3): Patient will don a pull over shirt with cueing following set up  Skilled Therapeutic Interventions/Progress Updates:    Patient presents up in wheelchair with plan to shower.  Attempted to have patient stand from wheelchair with grab bar.  Had practiced this in the gym earlier this week.  Patient able to lean forward, but no activation through legs to assist with stand.  Attempted use of Stedy to transfer to shower bench, but again - no activation in LE's to assist.  Patient reporting fatigue - although has not had PT yet this am.  Patient agreeable to wash up and dress at sink.  Discussed safety with shower stall transfer at home based on patient's current level of functioning.  Patient indicates she has a tub/shower also.  Transported patient to ADL apartment to demonstrate tub/shower transfer with bench.  Patient left up in wheelchair with SLP in room.    Therapy Documentation Precautions:  Precautions Precautions: Fall Precaution Comments: premorbid L hemi Required Braces or Orthoses: Other Brace Other Brace: R ankle stabilizing brace from prior ankle fx Restrictions Weight Bearing Restrictions: Yes RLE Weight Bearing: Weight bearing as tolerated LLE Weight Bearing: Non weight bearing Other Position/Activity Restrictions: Old L sided weakness from a prior CVA from ~3 years  ago. History of R abkle fracture ~1-1.5 yr ago  Pain: Denies pain  Therapy/Group: Individual Therapy  Mariah Milling 08/30/2022, 12:09 PM

## 2022-08-31 LAB — GLUCOSE, CAPILLARY
Glucose-Capillary: 126 mg/dL — ABNORMAL HIGH (ref 70–99)
Glucose-Capillary: 154 mg/dL — ABNORMAL HIGH (ref 70–99)
Glucose-Capillary: 236 mg/dL — ABNORMAL HIGH (ref 70–99)
Glucose-Capillary: 213 mg/dL — ABNORMAL HIGH (ref 70–99)

## 2022-08-31 NOTE — Progress Notes (Signed)
Occupational Therapy Session Note  Patient Details  Name: RIKKI-LEE CORCORAN MRN: [id number] Date of Birth: 05-Jul-1946  Today's Date: 08/31/2022 OT Individual Time: [id number] OT Individual Time Calculation (min): 58 min    Short Term Goals: Week 3:  OT Short Term Goal 1 (Week 3): Pt will transfer to [facility] with +1 A and LRAD OT Short Term Goal 2 (Week 3): Patient will maintain standing or assist with bridging to allow one caregiver assist for toileting OT Short Term Goal 3 (Week 3): Patient will sit without support x several minutes in preparation for edge of bed to wheelchair transfer OT Short Term Goal 4 (Week 3): Patient will don a pull over shirt with cueing following set up  Skilled Therapeutic Interventions/Progress Updates:  Pt received resting in bed for skilled OT session with focus on BADL retraining. Pt agreeable to interventions, demonstrating overall pleasant mood despite fatigued. Pt with no reports of pain. OT offering intermediate rest breaks and positioning suggestions throughout session to potential address pain/fatigue and maximize participation/safety in session.   Pt assists with R/L rolls in bed to perform brief change and peri-care. Pt requires overall Mod A to roll, dependent for posterior cleaning and application of barrier cream, completing anterior peri-care with Min A.  Pt comes into EOB sitting with Mod-Max A and cuing for technique. Sitting EOB, pt performs full-body sponge bathing with Mod A for thoroughness. Pt initiates bathing task with use of L-hand to clean face, torso, and thighs, requiring A for remainder of body. Pt requires heavy multimodal cuing for upright posture and sitting balance as patient has strong anterior lean with fatigue.  Pt doffs gown with increased time, requiring cuing for hemi-dressing techniques and min A to thread L-arm and pull shirt down back, pt able to don shirt overhead. Pt requires supine rest-break, during which socks and pants where  dependently donned/threaded.   Sitting back at EOB, pt dependent for donning of R-ankle brace and shoes, attempting STS with stedy but unable to reach full-stance due to fatigue. Pt requiring increased A for sitting balance, asking to return to supine. Resting in supine, pt bridges to A with donning of pants over bottom/hips.   Pt remained resting in bed  with all immediate needs met at end of session. Pt continues to be appropriate for skilled OT intervention to promote further functional independence.   Therapy Documentation Precautions:  Precautions Precautions: Fall Precaution Comments: premorbid L hemi Required Braces or Orthoses: Other Brace Other Brace: R ankle stabilizing brace from prior ankle fx Restrictions Weight Bearing Restrictions: Yes RLE Weight Bearing: Weight bearing as tolerated LLE Weight Bearing: Non weight bearing Other Position/Activity Restrictions: Old L sided weakness from a prior CVA from ~3 years ago. History of R abkle fracture ~1-1.5 yr ago   Therapy/Group: Individual Therapy  Maudie Mercury, OTR/L, MSOT  08/31/2022, 5:23 AM

## 2022-08-31 NOTE — Progress Notes (Addendum)
Speech Language Pathology Daily Session Note  Patient Details  Name: [name] MRN: [id number] Date of Birth: 12-13-1945  Today's Date: 08/31/2022 SLP Individual Time: 1300-1350 SLP Individual Time Calculation (min): 50 min  Short Term Goals: Week 3: SLP Short Term Goal 1 (Week 3): STG's = LTG's due to ELOS  Skilled Therapeutic Interventions: Pt seen this date for skilled ST intervention targeting swallowing goals outlined in care plan. Pt received fatigued and sleeping in TIS. Supportive daughter present stating that pt had a bad reaction to ear drops administered last evening. Agreeable to intervention in hospital room.   Given pt's fatigue, SLP provided education to pt's daughter re: how to prepare pureed textures at home, aspiration precautions, effective cueing strategies, and thickening liquids to nectar-thick consistencies to prepare for upcoming d/c home; resources provided to reinforce education. Pt's daughter asked appropriate questions and was actively engaged in discussion, and taking notes.   When pt awoke, she voiced that she would like to eat. Provided set-up assistance and overall Sup A for slow rate and "swallowing first" before taking another bite or sip - pt with much improved implementation of aspiration precautions with SLP reinforcement, education, and intervention. With improved implementation of precautions, pt with no overt s/sx concerning for airway intrusion with Dysphagia 1 nor nectar-thick liquids via cup. Therefore, recommend upgrading nectar-thick liquids to cup vs tsp to improve intake and hydration in preparation for d/c home; pt and pt's daughter verbalized understanding and appreciative of care. Discussed recommendations to continue with ST intervention upon d/c and daughter aware of pt's need for 24/7 supervision and assistance.   Pt left in room and OOB in TIS with all safety measures activated and call bell within reach. Daughter present and providing  assistance and supervision with PO intake. Continue per current ST POC.    Pain No pain reported; NAD  Therapy/Group: Individual Therapy  Zamaria Brazzle A Daya Dutt 08/31/2022, 3:22 PM

## 2022-08-31 NOTE — Progress Notes (Signed)
PROGRESS NOTE   Subjective/Complaints:  No events overnight.  Patient complaining of some dizziness overnight, daughter comments that she thinks it is the eardrops she was prescribed yesterday.  She endorses no further pain in her ear.  She does endorse some runny nose and general lethargy.  Otherwise, doing well.  Daughter also notes new bruise adjacent to pre-existing bruise on left hand.  ROS: +lethargy as per daughter-ongoing. + Dizziness  -new this a.m. , denies pain, CP, SOB, abd pain, HA,  cough, right sided lean improved. +fatigue- improved, insomnia improved, +LUE spasticity, +right hand skin tear- stable, +right ear numbness-resolved   Objective:   No results found. No results for input(s): "WBC", "HGB", "HCT", "PLT" in the last 72 hours.    No results for input(s): "NA", "K", "CL", "CO2", "GLUCOSE", "BUN", "CREATININE", "CALCIUM" in the last 72 hours.    Intake/Output Summary (Last 24 hours) at 08/31/2022 2043 Last data filed at 08/31/2022 1800 Gross per 24 hour  Intake 358 ml  Output --  Net 358 ml         Physical Exam: Vital Signs Blood pressure (!) 165/53, pulse 68, temperature 98.2 F (36.8 C), resp. rate 16, height '5\' 2"'$  (1.575 m), weight 64.7 kg, SpO2 97 %. Constitutional:      Comments: Frail appearing; awake, alert and awake, but very little speech; Sitting up in wheelchair; daughter at bedside, NAD, BMI 26.09 HENT: No right ear deformities noted    Head: Normocephalic and atraumatic.  Negative HIT bilaterally.  No nystagmus on EOMI.  VOR intact.    Comments: L facial droop- from prior stroke, but worsened later in day Tongue midline.     Right Ear: External ear normal.     Left Ear: External ear normal.     Nose: Nose normal. No congestion.     Mouth/Throat:     Mouth: Mucous membranes are moist    Pharynx: Oropharynx is clear. No oropharyngeal exudate.  Eyes:     General:        Right eye:  No discharge.        Left eye: No discharge.     Extraocular Movements: Extraocular movements intact.     Comments: Nystagmus to L>R  Cardiovascular:     Rate and Rhythm: Normal rate and regular rhythm.     Heart sounds: Normal heart sounds. No murmur heard.    No gallop.  Pulmonary:     Effort: No respiratory distress.     Breath sounds: Normal breath sounds. No wheezing, rhonchi or rales. Good air movement. Abdominal:     General:normoactive BS. There is no distension.     Palpations: Abdomen is soft.     Tenderness: There is no abdominal tenderness.  Musculoskeletal:     Cervical back: Neck supple. No tenderness.     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  Speech is mildly dysarthric but intelligible.  Follows simple commands.    Finger to nose on R side ok, but cannot test L Denies dizziness Squat pivots Max A  Psychiatric:     Comments: Flat, decreased interaction  GU: voiding on own! Total A for peri hygiene Skin: IV in place  Assessment/Plan: 1. Functional deficits which require 3+ hours per day of interdisciplinary therapy in a comprehensive inpatient rehab setting. Physiatrist is providing close team supervision and 24 hour management of active medical problems listed below. Physiatrist and rehab team continue to assess barriers to discharge/monitor patient progress toward functional and medical goals  Care Tool:  Bathing    Body parts bathed by patient: Left arm, Chest, Abdomen, Right upper leg, Left upper leg, Face   Body parts bathed by helper: Right arm, Front perineal area, Buttocks, Right lower leg, Left  lower leg     Bathing assist Assist Level: Moderate Assistance - Patient 50 - 74%     Upper Body Dressing/Undressing Upper body dressing   What is the patient wearing?: Pull over shirt    Upper body assist Assist Level: Moderate Assistance - Patient 50 - 74%    Lower Body Dressing/Undressing Lower body dressing      What is the patient wearing?: Underwear/pull up, Pants     Lower body assist Assist for lower body dressing: Maximal Assistance - Patient 25 - 49%     Toileting Toileting    Toileting assist Assist for toileting: 2 Helpers     Transfers Chair/bed transfer  Transfers assist     Chair/bed transfer assist level: Maximal Assistance - Patient 25 - 49%     Locomotion Ambulation   Ambulation assist   Ambulation activity did not occur: Safety/medical concerns  Assist level: 2 helpers Assistive device: Lite Gait Max distance: 49f   Walk 10 feet activity   Assist  Walk 10 feet activity did not occur: Safety/medical concerns  Assist level: 2 helpers Assistive device: Lite Gait   Walk 50 feet activity   Assist Walk 50 feet with 2 turns activity did not occur: Safety/medical concerns         Walk 150 feet activity   Assist Walk 150 feet activity did not occur: Safety/medical concerns         Walk 10 feet on uneven surface  activity   Assist Walk 10 feet on uneven surfaces activity did not occur: Safety/medical concerns         Wheelchair     Assist Is the patient using a wheelchair?: Yes Type of Wheelchair: Manual    Wheelchair assist level: Supervision/Verbal cueing Max wheelchair distance: 171f   Wheelchair 50 feet with 2 turns activity    Assist        Assist Level: Maximal Assistance - Patient 25 - 49%   Wheelchair 150 feet activity     Assist      Assist Level: Total Assistance - Patient < 25%   Blood pressure (!) 165/53, pulse 68, temperature 98.2 F (36.8 C), resp. rate 16, height '5\' 2"'$   (1.575 m), weight 64.7 kg, SpO2 97 %.  Medical Problem List and Plan: 1. 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  Impaired mobility: daughter notes mom does not tolerate lovenox, d/ced, SCDs ordered.            Continue Aspirin 81 mg daily, plavix for 3 weeks.  3. Pain: N/A. Oxycodone discontinued. 4. Insomnia: resolved. Trazodone and zoloft d/ced 5. Neuropsych/cognition: This patient is? capable of making decisions on her own behalf. 6. Skin/Wound Care: Routine skin checks  -Protective cream to b/l heels 7. Fluids/Electrolytes/Nutrition: Routine in and outs with follow-up chemistries 8.  Dysphagia.  Downgraded to D1/nectar, discussed with SLP and daughter. Reviewed daughter's note that swallowing is improved. Discussed D1/nectar thick options for home, recommended smoothies as a nutritious option 9.  Hypertension.  Hydralazine d/ced due to emesis as per daughter's request. Losartan recently held due to AKI, but restarted at '25mg'$  BID. Additional '25mg'$  daily prn added for SBP >180. Daughter prefers slow reduction of BP- at baseline she runs [id number] systolic 10.  Acute urinary retention.  d/c flomax. Resolved.  11.  AKI on CKD stage III.  Baseline creatinine 1.9-2.0.  Cr reviewed from 2/13 and is better than baseline, Discussed with daughter that most recent creatinine on 2/20 continues to be better than baseline. Continue IVF over night- please renew nightly over the weekend -Order nightly at bedtime, 50 cc/h  12.  Diabetes  mellitus.  Hemoglobin A1c 7.0, discussed with daughter.  CBGs being monitor and reviewed, appear stable. Placed order for no juice. Provided dietary education.  Recent Labs    08/31/22 0634 08/31/22 1137 08/31/22 1636  GLUCAP 126* 154* 236*   - Blood sugars well-controlled, 1 high reading this evening, monitor  13.  History of right ankle fracture.  Patient does wear an ankle stabilizing brace due to previous ankle fx. . 14.  Hyperlipidemia.  Zetia/fenofibrate d/ced as per daughter's preference. Lipid panel repeated and reviewed with daughter.  15.  Constipation.  Miralax d/ced as per daughter's preference. Senokot 2 tabs at bedtime as needed.  Sorbitol caused side  16. 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 Kpad ordered. Discuss oral spasticity medications 17. Suboptimal vitamin D: continue ergocalciferol 50,000U once per week for 7 weeks, discussed that this can help with fatigue 18. Anemia: repeat Hgb tomorrow. ->  2-24: Stable, 12.0 19. CKD: restarted fluids at night 20. Fatigue: discussed amantadine and modafinil 21. HTN: increase losartan to 79mBID  -Elevated, has as needed for SBP greater than 180, monitor    08/31/2022    7:38 PM 08/31/2022    1:09 PM 08/31/2022    5:00 AM  Vitals with BMI  Systolic [id number] Diastolic 53 55 58  Pulse 68 68 78     22. New acute right medullary infarct : continue aspirin/plavix 21 days from 2/13 23. Right ear numbness: discussed could be secondary to MRI machine noise, will order outpatient ENT eval for her 24. Right ear pain: will add Cortisporin ear drops 3 drops q8H.  -2-24: Drops discontinued due to ?vertiginous symptoms.  No further ear pain.      [place]: [place]  08/31/2022, 8:43 PM

## 2022-08-31 NOTE — Progress Notes (Signed)
Physical Therapy Session Note  Patient Details  Name: [name] MRN: [id number] Date of Birth: Dec 11, 1945  Today's Date: 08/31/2022 PT Individual Time: [id number] PT Individual Time Calculation (min): 53 min   Short Term Goals: Week 3:  PT Short Term Goal 1 (Week 3): STG = LTG due to ELOS  Skilled Therapeutic Interventions/Progress Updates:    Therapist returned to make-up missed minutes and pt received sitting in TIS wheelchair with her daughter present and both reporting she is feeling better this afternoon and is agreeable to participation. Discussed upcoming D/C with plan to initiate further hands-on training/education today. Pt's daughter reports she has been assisting pt with stand pivot transfers by performing "a little dance" to step pt's feet around. Had pt's daughter perform demonstration of the transfer to allow therapist to provide feedback to improve form/technique to maintain both pt and daughter's safety while providing pt increased opportunity to gain independence.  Pt's daughter assisted pt with L stand pivot TIS w/c>EOB via having pt put her arms around the daughter's shoulders and she assisted pt coming to stand underneath the pt's shoulder blades - noticed during the transfer that the pt was only pivoting on L foot with R foot off of the floor throughout - pt's daughter reports she felt like she was doing at most 30% of the effort and pt was doing remainder.  Therapist educated pt's daughter on technique of using facilitation at pt's pelvis/buttocks to assist with lifting to stand while using therapist's knees to block/guard pt's R knee while pt stepping - also educating on use of facilitation at pt's hips/buttocks to facilitate a weight shift and using therapist's feet to help pt step when needed. Pt performed R stand pivot EOB>TIS w/c with mod assists and then afterwards was fatigued and unable to perform L stand pivot back to EOB therefore requiring therapist to  transition to squat pivot transfer. Therapist provided visual demonstration and education to pt's daughter on the different positioning for this transfer compared to stand pivot.   Pt's daughter reports she is not planning to purchase a stedy as a back-up option because she states this causes pt's motor planning to shut down and pt confirms she doesn't feel as safe with the equipment as she does with hands-on assistance.  Pt noted to be incontinent of bladder and bowels. Sit>stand EOB>B UE support on therapist's shoulders with heavy mod assist and standing with heavy mod assist performed L lateral side steps towards EOB to improve pt's positioning. Sit>supine with max assist for trunk descent and B LE management into bed. Rolling R/L with max assist during dependent LB clothing management and peri-care for time management and energy conservation as pt wanting to return to the w/c for dinner.  R squat pivot EOB>TIS w/c with heavy max assist for lifting/pivoting hips and then to scoot hips back in seat. Discussed having some padding on pt's knees to help with pain management during transfers.   Pt's daughter continues to express concerns regarding loaner wheelchair including:  - floor-to-seat height preventing pt from being able to perform w/c propulsion and making it more difficult to position pt in preparation for transfers - difficulty ensuring pt's hips adequately scooted back in seat with the width of the wheelchair back  Therapist reinforced education on importance of performing pressure relieving schedule in the TIS wheelchair - tilt back fully for 1-68mnutes at least ever hour - and rolling schedule in the bed.  At end of session, pt left seated in TIS  w/c in the care of her daughter.   Therapy Documentation Precautions:  Precautions Precautions: Fall Precaution Comments: premorbid L hemi Required Braces or Orthoses: Other Brace Other Brace: R ankle stabilizing brace from prior ankle  fx Restrictions Weight Bearing Restrictions: Yes RLE Weight Bearing: Weight bearing as tolerated LLE Weight Bearing: Weight bearing as tolerated Other Position/Activity Restrictions: Old L sided weakness from a prior CVA from ~3 years ago. History of R abkle fracture ~1-1.5 yr ago   Pain:  Only pain is from pressure of therapist's knees on her knees during the transfers - discussed trying padding on pt's knees for pain management.    Therapy/Group: Individual Therapy  Tawana Scale , PT, DPT, NCS, CSRS 08/31/2022, 6:00 PM

## 2022-08-31 NOTE — Progress Notes (Addendum)
Physical Therapy Session Note  Patient Details  Name: [name] MRN: [id number] Date of Birth: 1946/06/24  Today's Date: 08/31/2022 PT Individual Time: 1008-1021 PT Individual Time Calculation (min): 13 min   and  Today's Date: 08/31/2022 PT Missed Time: 32 Minutes Missed Time Reason: Patient ill (Comment) (nausea)  Short Term Goals: Week 3:  PT Short Term Goal 1 (Week 3): STG = LTG due to ELOS  Skilled Therapeutic Interventions/Progress Updates:    Pt received sitting in loaner TIS w/c with her daughter present. Pt's daughter is an excellent advocate for the pt and reports pt is not feeling well. States the pt started feeling nauseous last night and had emesis production and is still feeling nauseous this morning. Daughter feels the onset of pt's symptoms correlate with starting new ear drops - notified on-call MD to further assess. Pt's daughter reports with the new loaner w/c, when pt is sat upright (not tilted backwards) that it looks like pt's pelvis is too far forward in the seat - discussed the following possible contributions:  Lateral supports on back causing transfers not to land as far back in the seat, but pt's daughter denies contribution from this as CIR w/c had those lateral supports before Wheelchair back is not seated far enough back on the frame; however, it is as far back as possible  Change in cushion not providing as much as support to the pelvis; however, the loaner cushion may not be the one that pt will receive for her permanent wheelchair  Will continue to assess this and address as able when pt is feeling up to increased participation.  Assessed pt's vitals: BP 174/71 (MAP 100), HR 80bpm with pt's daughter reporting pt has not yet taken BP medication because she is waiting until pt is less nauseous to ensure medications stay down. Pt politely declines further participation in therapy at this time and left in the care of her daughter. Missed 32 minutes of skilled  physical therapy - will make-up missed time as scheduling allows.  Therapy Documentation Precautions:  Precautions Precautions: Fall Precaution Comments: premorbid L hemi Required Braces or Orthoses: Other Brace Other Brace: R ankle stabilizing brace from prior ankle fx Restrictions Weight Bearing Restrictions: Yes RLE Weight Bearing: Weight bearing as tolerated LLE Weight Bearing: Non weight bearing Other Position/Activity Restrictions: Old L sided weakness from a prior CVA from ~3 years ago. History of R abkle fracture ~1-1.5 yr ago   Therapy/Group: Individual Therapy  Tawana Scale , PT, DPT, NCS, CSRS 08/31/2022, 8:00 AM

## 2022-09-01 LAB — GLUCOSE, CAPILLARY
Glucose-Capillary: 122 mg/dL — ABNORMAL HIGH (ref 70–99)
Glucose-Capillary: 141 mg/dL — ABNORMAL HIGH (ref 70–99)
Glucose-Capillary: 184 mg/dL — ABNORMAL HIGH (ref 70–99)
Glucose-Capillary: 205 mg/dL — ABNORMAL HIGH (ref 70–99)

## 2022-09-01 NOTE — Progress Notes (Signed)
Speech Language Pathology Daily Session Note  Patient Details  Name: [name] MRN: [id number] Date of Birth: 1945/11/01  Today's Date: 09/01/2022 SLP Individual Time: 1455-1542 SLP Individual Time Calculation (min): 47 min  Short Term Goals: Week 3: SLP Short Term Goal 1 (Week 3): STG's = LTG's due to ELOS  Skilled Therapeutic Interventions:   Pt seen for skilled SLP session to address speech and swallowing goals. Daughter reported pt with good intake of puree diet and nectar-thick liquids by cup without concerns for aspiration. Pt agreeable to snack of applesauce and thickened ginger ale. She consumed entire applesauce and ginger ale (4 oz each). Observed x1 instance of immediate coughing with nectar-thick liquids by cup. Incident appeared to be related to pt attempting to answer a question prior to swallowing. Reviewed swallow safety strategies around intake, especially at meals with family and friends. Reinforced slow pace and waiting to converse after bite or sip is completely swallowed. Pt completed x15 reps of EMST exercise. Regarding speech, reviewed compensatory speech strategies including loud voice, over-articulation, and pausing between breath groups. Pt demonstrated and verbalized understanding by implementing strategies at the word, phrase, and conversational level with min-mod cues. Recommend continue SLP to address speech and swallowing goals. Pt left sitting upright in chair with chair alarm set and call bell within reach.   Pain Pain Assessment Pain Scale: 0-10 Pain Score: 0-No pain  Therapy/Group: Individual Therapy  Wyn Forster 09/01/2022, 3:47 PM

## 2022-09-01 NOTE — Progress Notes (Signed)
Pts. Daughter requested if patient could have a CBC with diff and CMP done when patient next gets her labs drawn. Sent page to Dr. Tressa Busman with pts. Daughters request, and received order for CBC with diff and CMP to be done on Tuesday morning.

## 2022-09-01 NOTE — Discharge Summary (Signed)
Physician Discharge Summary  Patient ID: [name] MRN: [id number] DOB/AGE: 07/09/1945 77 y.o.  Admit date: 08/13/2022 Discharge date: 09/04/2022  Discharge Diagnoses:  Principal Problem:   Lateral medullary syndrome Active Problems:   Left hemiparesis (HCC)   Spasticity as late effect of cerebrovascular accident (CVA) DVT prophylaxis Dysphagia Hypertension Acute urinary retention AKI on CKD stage III Diabetes mellitus History of ankle fracture Hyperlipidemia Constipation Sinus infection Discharged Condition: Stable  Significant Diagnostic Studies: DG Chest 2 View  Result Date: 08/24/2022 CLINICAL DATA:  Tachypnea EXAM: CHEST - 2 VIEW COMPARISON:  08/20/2022 FINDINGS: Frontal and lateral views of the chest demonstrate an unremarkable cardiac silhouette. Stable scarring, without acute airspace disease, effusion, or pneumothorax. No acute bony abnormalities. IMPRESSION: 1. No acute intrathoracic process. Electronically Signed   By: Randa Ngo M.D.   On: 08/24/2022 16:10   DG Swallowing Func-Speech Pathology  Result Date: 08/23/2022 Table formatting from the original result was not included. Modified Barium Swallow Study Patient Details Name: [name] MRN: [id number] Date of Birth: December 24, 1945 Today's Date: 08/23/2022 HPI/PMH: HPI: 77 yo female adm to [facility] with several days of lethargy - treated for UTI, readmitted after found to have drooling, left sided weakness and dysphagia.  Medical history significant of CVA w/ left side residuals, HTN, HLD, DM2, CKD4. 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   Pt passed [facility], but noted to have ongoing symptoms of dysphagia.  Prior MOCA score 29/30 when pt had CVA 2019.  Follow up for dysphagia treatment indicated. Clinical Impression: Clinical Impression: MBSS completed to assess pt's tolerance  for current diet textures given limited clinical utility of tolerance. At this time, pt presents with moderate oropharyngeal dysphagia c/b delayed oral transit resulting in premature spillage to vallecula and trace oral residuals post-swallow. Pharyngeally, pt exhibits decreased base of tongue approximation to posterior pharyngeal wall, diminished laryngeal elevation + closure, decreased epiglottic inversion, and diffuse trace to mild pharyngeal stasis.  Aforementioned findings resulted in deep penetration before the swallow and silent aspiration during the swallow with thin liquid via tsp. Cued cough was ineffective at clearing. Trace-transient penetration noted with honey-thick liquid via cup. No additional s/sx concerning for penetration nor aspiration observed. Appeared to tolerate nectar-thick liquid best when provided via tsp and small cup sips. Pt was noted to be impulsive with intake and requires hand-over-hand cues for safe intake/bolus size monitoring. Cued multiple swallows were not very effective in clearing pharyngeal stasis due to pt's decreased ability to produce volitional throat clear in the absence of PO.  Given objective findings, recommend continuation of Dysphagia 1 consistencies with nectar-thick liquid via tsp only given 1:1 supervision for safety. Recommend medications be crushed and placed in puree. Additionally, recommend pt be OOB for all meals or sitting fully upright in bed, be prompted to hold head in neutral positioning during swallow, and avoid mixed consistencies (try to do all drink and then solids, and then finish with liquids). Results and recommendations reviewed with pt, pt's daughter, and MD; they verbalized understanding and agreement with plan. Will monitor for tolerance. Suspect pt will likely be discharged on this date. Per pt reports, she enjoys pureed textures and finds them easier to consume. May consider honey-thick liquids if pt is not tolerating nectar-thick liquids  via tsp clinically. Factors that may increase risk of adverse event in presence of aspiration (Buckman 2021):  Factors that may increase risk of adverse event in presence of aspiration (Bradley 2021): Frail or deconditioned; Limited mobility; Dependence for feeding and/or oral hygiene; Weak cough; Reduced cognitive function Recommendations/Plan: Swallowing Evaluation Recommendations Swallowing Evaluation Recommendations Recommendations: PO diet PO Diet Recommendation: Dysphagia 1 (Pureed); Mildly thick liquids (Level 2, nectar thick) Liquid Administration via: Spoon Medication Administration: Crushed with puree Supervision: Patient able to self-feed; Full supervision/cueing for swallowing strategies Swallowing strategies  : Minimize environmental distractions; Slow rate; Small bites/sips; Check for pocketing or oral holding; Avoid mixed consistencies Postural changes: Position pt fully upright for meals; Stay upright 30-60 min after meals; Out of bed for meals Oral care recommendations: Oral care BID (2x/day); Staff/trained caregiver to provide oral care; Use suctioning for oral care Caregiver Recommendations: Avoid jello, ice cream, thin soups, popsicles; Have oral suction available; Remove water pitcher Treatment Plan Treatment Plan Treatment recommendations: Therapy as outlined in treatment plan below Follow-up recommendations: Home health SLP Functional status assessment: Patient has had a recent decline in their functional status and/or demonstrates limited ability to make significant improvements in function in a reasonable and predictable amount of time. Treatment frequency: Min 3x/week Treatment duration: 1 week Interventions: Patient/family education; Aspiration precaution training; Diet toleration management by SLP; Respiratory muscle strength training; Trials of upgraded texture/liquids; Oropharyngeal exercises Recommendations Recommendations for follow up therapy are one component of a  multi-disciplinary discharge planning process, led by the attending physician.  Recommendations may be updated based on patient status, additional functional criteria and insurance authorization. Assessment: Orofacial Exam: Orofacial Exam Oral Cavity: Oral Hygiene: WFL Oral Cavity - Dentition: Adequate natural dentition Orofacial Anatomy: WFL Anatomy: Anatomy: WFL Thin Liquids: Thin Liquids (Level 0) Thin Liquids : Impaired Bolus delivery method: Spoon Thin Liquid - Impairment: Oral Impairment; Pharyngeal impairment Lip Closure: No labial escape Tongue control during bolus hold: Posterior escape of less than half of bolus Bolus transport/lingual motion: Slow tongue motion Oral residue: Trace residue lining oral structures Location of oral residue : Tongue Initiation of swallow : Pyriform sinuses Soft palate elevation: No bolus between soft palate (SP)/pharyngeal wall (PW) Laryngeal elevation: Partial superior movement of thyroid cartilage/partial approximation of arytenoids to epiglottic petiole Anterior hyoid excursion: Complete Epiglottic movement: Partial Laryngeal vestibule closure: Incomplete, narrow column air/contrast in laryngeal vestibule Pharyngeal stripping wave : Present - complete Pharyngeal contraction (A/P view only): N/A Pharyngoesophageal segment opening: Complete distension and complete duration, no obstruction of flow Tongue base retraction: Trace column of contrast or air between tongue base and PPW Pharyngeal residue: Trace residue within or on pharyngeal structures Location of pharyngeal residue: Diffuse (>3 areas) Penetration/Aspiration Scale (PAS) score: 8.  Material enters airway, passes BELOW cords without attempt by patient to eject out (silent aspiration)  Mildly Thick Liquids: Mildly thick liquids (Level 2, nectar thick) Mildly thick liquids (Level 2, nectar thick): Impaired Bolus delivery method: Spoon; Cup Mildly Thick Liquid - Impairment: Oral Impairment; Pharyngeal impairment Lip  Closure: No labial escape Tongue control during bolus hold: Posterior escape of less than half of bolus Bolus transport/lingual motion: Slow tongue motion Oral residue: Trace residue lining oral structures Location of oral residue : Tongue; Palate Initiation of swallow : Pyriform sinuses Soft palate elevation: No bolus between soft palate (SP)/pharyngeal wall (PW) Laryngeal elevation: Partial superior movement of thyroid cartilage/partial approximation of arytenoids to epiglottic petiole Anterior hyoid excursion: Complete Epiglottic movement: Complete Laryngeal vestibule closure: Complete, no air/contrast in laryngeal vestibule Pharyngeal stripping wave : Present - complete Pharyngeal contraction (A/P view only): N/A Pharyngoesophageal  segment opening: Complete distension and complete duration, no obstruction of flow Tongue base retraction: Trace column of contrast or air between tongue base and PPW Pharyngeal residue: Trace residue within or on pharyngeal structures Location of pharyngeal residue: Diffuse (>3 areas) Penetration/Aspiration Scale (PAS) score: 1.  Material does not enter airway  Moderately Thick Liquids: Moderately thick liquids (Level 3, honey thick) Moderately thick liquids (Level 3, honey thick): Impaired Bolus delivery method: Cup; Spoon Moderately Thick Liquid - Impairment: Oral Impairment; Pharyngeal impairment Lip Closure: No labial escape Tongue control during bolus hold: Escape to lateral buccal cavity/floor of mouth Bolus transport/lingual motion: Slow tongue motion Oral residue: Trace residue lining oral structures Location of oral residue : Tongue; Palate Initiation of swallow : Pyriform sinuses Soft palate elevation: No bolus between soft palate (SP)/pharyngeal wall (PW) Laryngeal elevation: Partial superior movement of thyroid cartilage/partial approximation of arytenoids to epiglottic petiole Anterior hyoid excursion: Complete Epiglottic movement: Partial Laryngeal vestibule closure:  Incomplete, narrow column air/contrast in laryngeal vestibule Pharyngeal stripping wave : Present - complete Pharyngeal contraction (A/P view only): N/A Pharyngoesophageal segment opening: Complete distension and complete duration, no obstruction of flow Tongue base retraction: Trace column of contrast or air between tongue base and PPW Pharyngeal residue: Collection of residue within or on pharyngeal structures Location of pharyngeal residue: Diffuse (>3 areas) Penetration/Aspiration Scale (PAS) score: 2.  Material enters airway, remains ABOVE vocal cords then ejected out  Puree: Puree Puree: Impaired Puree - Impairment: Oral Impairment; Pharyngeal impairment Lip Closure: No labial escape Bolus transport/lingual motion: Slow tongue motion Oral residue: Trace residue lining oral structures Location of oral residue : Tongue; Palate Initiation of swallow: Posterior laryngeal surface of the epiglottis Soft palate elevation: No bolus between soft palate (SP)/pharyngeal wall (PW) Laryngeal elevation: Complete superior movement of thyroid cartilage with complete approximation of arytenoids to epiglottic petiole Anterior hyoid excursion: Complete Epiglottic movement: Complete Laryngeal vestibule closure: Complete, no air/contrast in laryngeal vestibule Pharyngeal stripping wave : Present - complete Pharyngeal contraction (A/P view only): N/A Pharyngoesophageal segment opening: Complete distension and complete duration, no obstruction of flow Tongue base retraction: Trace column of contrast or air between tongue base and PPW Pharyngeal residue: Collection of residue within or on pharyngeal structures Location of pharyngeal residue: Diffuse (>3 areas) Penetration/Aspiration Scale (PAS) score: 1.  Material does not enter airway Solid: Solid Solid: Not Tested Pill: Pill Pill: Not Tested Compensatory Strategies: Compensatory Strategies Compensatory strategies: Yes Multiple swallows: Ineffective Ineffective Multiple Swallows:  Mildly thick liquid (Level 2, nectar thick); Moderately thick liquid (Level 3, honey thick)   General Information: Caregiver present: No  Diet Prior to this Study: Dysphagia 1 (pureed); Mildly thick liquids (Level 2, nectar thick)   Temperature : Normal   Respiratory Status: WFL   Supplemental O2: None (Room air)   History of Recent Intubation: No  Behavior/Cognition: Alert; Cooperative; Pleasant mood Self-Feeding Abilities: Able to self-feed; Needs set-up for self-feeding Baseline vocal quality/speech: Other (comment) (strained vocal quality and low intensity) Volitional Cough: Able to elicit Volitional Swallow: Able to elicit Exam Limitations: Other (comment); Fatigue; Poor positioning Goal Planning: Prognosis for improved oropharyngeal function: Fair Barriers to Reach Goals: Cognitive deficits; Severity of deficits; Time post onset; Overall medical prognosis No data recorded Patient/Family Stated Goal: To continue eating and drinking Consulted and agree with results and recommendations: Patient; Physician; Family member/caregiver; Nurse; Nurse Tech Pain: Pain Assessment Pain Assessment: No/denies pain End of Session: Start Time:SLP Start Time (ACUTE ONLY): 1045 Stop Time: SLP Stop Time (ACUTE ONLY): 1100  Time Calculation:SLP Time Calculation (min) (ACUTE ONLY): 15 min Charges: SLP Evaluations $ SLP Speech Visit: 1 Visit SLP Evaluations $BSS Swallow: 1 Procedure $MBS Swallow: 1 Procedure $Swallowing Treatment: 1 Procedure SLP visit diagnosis: SLP Visit Diagnosis: Dysphagia, oropharyngeal phase (R13.12); Dysarthria and anarthria (R47.1) Past Medical History: Past Medical History: Diagnosis Date  Allergy   Arthritis   Broken ankle   Cataract   Diabetes mellitus without complication ([place])   History of colonoscopy   Hypertension   Macular degeneration syndrome   with edema---being treated.   Stage 4 chronic kidney disease ([place])   Stroke ([place]) 2019 Past Surgical History: Past Surgical History: Procedure Laterality Date   callous removal  Left 2017  located on 1st digit on L foot 2/2 diabetes  EYE SURGERY   Bethany A Lutes 08/23/2022, 9:19 PM  DG Chest 2 View  Result Date: 08/20/2022 CLINICAL DATA:  Cough EXAM: CHEST - 2 VIEW COMPARISON:  Chest x-ray 08/07/2022 FINDINGS: The heart is mildly enlarged, unchanged. There central pulmonary vascular congestion. There is no focal lung infiltrate, pleural effusion or pneumothorax. No acute fractures are seen. IMPRESSION: Mild cardiomegaly with central pulmonary vascular congestion. Electronically Signed   By: Ronney Asters M.D.   On: 08/20/2022 20:43   [name] CONTRAST  Result Date: 08/20/2022 CLINICAL DATA:  Stroke suspected EXAM: MRI HEAD WITHOUT CONTRAST TECHNIQUE: Multiplanar, multiecho pulse sequences of the brain and surrounding structures were obtained without intravenous contrast. COMPARISON:  08/07/2022 MRI head, correlation is also made with CT head 08/19/2022 FINDINGS: Brain: Restricted diffusion with ADC correlate along the left anteromedial aspect of the medulla (series 2, image 12 and series 1050, image 12). This is medial to the area of acute infarct noted on 08/07/2022. No acute hemorrhage, mass, mass effect, or midline shift. No hydrocephalus or extra-axial collection. Redemonstrated chronic right parietal infarct and right greater than left cerebellar infarcts, as well as bilateral basal ganglia/corona radiata lacunar infarcts. Advanced cerebral volume loss for age. Confluent T2 hyperintense signal in the periventricular white matter and pons, likely the sequela of moderate to severe chronic small vessel ischemic disease. Vascular: Normal arterial flow voids. Skull and upper cervical spine: Normal marrow signal. Sinuses/Orbits: Clear paranasal sinuses. Status post bilateral lens replacements. Other: The mastoids are well aerated. IMPRESSION: Acute infarct along the left anteromedial aspect of the medulla, medial to the area of acute infarct noted on 08/07/2022  MRI. These results will be called to the ordering clinician or representative by the Radiologist Assistant, and communication documented in the PACS or [employer]. Electronically Signed   By: Merilyn Baba M.D.   On: 08/20/2022 19:55   CT HEAD WO CONTRAST (5MM)  Result Date: 08/19/2022 CLINICAL DATA:  Stroke suspected EXAM: CT HEAD WITHOUT CONTRAST TECHNIQUE: Contiguous axial images were obtained from the base of the skull through the vertex without intravenous contrast. RADIATION DOSE REDUCTION: This exam was performed according to the departmental dose-optimization program which includes automated exposure control, adjustment of the mA and/or kV according to patient size and/or use of iterative reconstruction technique. COMPARISON:  08/14/2022 FINDINGS: Brain: No evidence of acute infarction, hemorrhage, mass, mass effect, or midline shift. No hydrocephalus or extra-axial fluid collection. Redemonstrated posterior right MCA distribution infarct, right greater than left basal ganglia lacunar infarcts, and right greater than left remote cerebellar infarcts. Periventricular white matter changes, likely the sequela of chronic small vessel ischemic disease. Previously noted left medulla infarct is not visible on CT. Unchanged right greater than left ventricular prominence. Vascular:  No hyperdense vessel. Atherosclerotic calcifications in the intracranial carotid and vertebral arteries. Skull: Negative for fracture or focal lesion. Sinuses/Orbits: No acute finding. Other: The mastoid air cells are well aerated. IMPRESSION: No acute intracranial process. Previously noted left middle infarct is not visible on CT. Electronically Signed   By: Merilyn Baba M.D.   On: 08/19/2022 20:11   CT HEAD WO CONTRAST (5MM)  Result Date: 08/14/2022 CLINICAL DATA:  Initial evaluation for neuro deficit, stroke suspected. EXAM: CT HEAD WITHOUT CONTRAST TECHNIQUE: Contiguous axial images were obtained from the base of the skull  through the vertex without intravenous contrast. RADIATION DOSE REDUCTION: This exam was performed according to the departmental dose-optimization program which includes automated exposure control, adjustment of the mA and/or kV according to patient size and/or use of iterative reconstruction technique. COMPARISON:  Prior MRI from 08/07/2022. FINDINGS: Brain: Age-related cerebral atrophy with chronic small vessel ischemic disease. Chronic posterior right MCA distribution infarct normal the right parietal lobe. Scattered remote lacunar infarcts present about the right basal ganglia. Multiple chronic cerebellar infarcts, right greater than left. Previously identified left medullary infarct not visible by CT. No other acute large vessel territory infarct. No acute intracranial hemorrhage. No mass lesion or midline shift. Ventricular prominence related global parenchymal volume loss of hydrocephalus. No extra-axial fluid collection. Vascular: No abnormal hyperdense vessel. Calcified atherosclerosis present at the skull base. Skull: Scalp soft tissues and calvarium within normal limits. Sinuses/Orbits: Globes orbital soft tissues within normal limits. Paranasal sinuses are largely clear. No mastoid effusion. Other: None. IMPRESSION: 1. No acute intracranial abnormality. Recently identified small left medullary infarct not visible by CT. 2. Age-related cerebral atrophy with extensive chronic ischemic changes as above, stable. Electronically Signed   By: Jeannine Boga M.D.   On: 08/14/2022 19:07   DG Swallowing Func-Speech Pathology  Result Date: 08/08/2022 Table formatting from the original result was not included. Objective Swallowing Evaluation: Type of Study: Bedside Swallow Evaluation  Patient Details Name: [name] MRN: [id number] Date of Birth: [birth year] Today's Date: 08/08/2022 Time: SLP Start Time (ACUTE ONLY): 1400 -SLP Stop Time (ACUTE ONLY): 1446 SLP Time Calculation (min) (ACUTE ONLY): 46 min  Past Medical History: Past Medical History: Diagnosis Date  Allergy   Arthritis   Broken ankle   Cataract   Diabetes mellitus without complication ([place])   History of colonoscopy   Hypertension   Macular degeneration syndrome   with edema---being treated.   Stage 4 chronic kidney disease ([place])   Stroke ([place]) 2019 Past Surgical History: Past Surgical History: Procedure Laterality Date  callous removal  Left 2017  located on 1st digit on L foot 2/2 diabetes  EYE SURGERY   HPI: 77 yo female adm to [facility] with several days of lethargy - treated for UTI, readmitted after found to have drooling, left sided weakness and dysphagia.  Medical history significant of CVA w/ left side residuals, HTN, HLD, DM2, CKD4. 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   Pt passed [facility], but noted to have ongoing symptoms of dysphagia.  Prior MOCA score 29/30 when pt had CVA 2019.  Subjective: pt awake in bed, dtr present  Recommendations for follow up therapy are one component of a multi-disciplinary discharge planning process, led by the attending physician.  Recommendations may be updated based on patient status, additional functional criteria and insurance authorization. Assessment / Plan / Recommendation  08/08/2022   8:27 PM Clinical Impressions Clinical Impression Patient presents with moderate oropharyngeal dysphagia - with sensorimotor deficits.  She demonstrates moderately impaired oral control -most notably with thin and cracker boluses resulting in premature spillage of barium into pharynx and inconsistently oral retention.  Vertical mastication pattern with solid - at anterior oral cavity with partial cracker spilling to vallecular space poorly controlled. She required suctioning to remove portion of solid cracker that was retained left anterior oral cavity. Pharyngeal swallow with cracker bolus  was not elicited until after 3rd bolus spilled into pharynx - at vallecular space - requiring 20 seconds with retained solid in pharynx before swallow was finally triggered. Pharyngeal swallow is severely delayed as barium pools at pyriform sinus with pt frequently requiring verbal cue to swallow - most notably with liquids.  SLP counted "1,2,3.. Swallow" to improve pt's swallow timing.  Audible aspiration occured with nectar when pt was swallowing tablet due to impaired coordination resulting in prolonged oral transiting.  In addition, impaired adequacy of pharyngeal contraction resulted in pharyngeal retentin of nectar that spilled into airway postswallow.  Pt cleared large amount of nectar aspiration with reflexive cough but minimal amount remained.  Various postures including chin tuck and head turn were not helpful.  Due to pt's severe pharyngeal initiation delay, recommend nectar liquids and tsps of thin.  Pt will benefit from oral suction before, during and after meals.  SLP modified diet to changed diet order to dys1/nectar - and educated pt/daughter to findings, recommendations. Will follow for dysphagia management and treatment. With current level of dysphagia, adequacy of po intake for hydration and nutrition is a concern.  Family reports pt has always "swallowed slowly" causing SLP to question if pt may have undiagnosed dysphagia since her 2019 CVA.  SLP Visit Diagnosis Dysphagia, oropharyngeal phase (R13.12);Dysphagia, unspecified (R13.10) Impact on safety and function Moderate aspiration risk;Severe aspiration risk;Risk for inadequate nutrition/hydration     08/08/2022   8:27 PM Treatment Recommendations Treatment Recommendations Therapy as outlined in treatment plan below     08/08/2022   8:48 PM Prognosis Prognosis for Safe Diet Advancement Fair Barriers to Reach Goals Severity of deficits   08/08/2022   8:27 PM Diet Recommendations SLP Diet Recommendations Dysphagia 1 (Puree) solids;Nectar thick liquid  Liquid Administration via Spoon;Cup;Straw Medication Administration Whole meds with puree Compensations Small sips/bites;Slow rate;Other (Comment) Postural Changes Seated upright at 90 degrees;Remain semi-upright after after feeds/meals (Comment)     08/08/2022   8:27 PM Other Recommendations Oral Care Recommendations Oral care before and after PO;Other (Comment) Other Recommendations Have oral suction available;Order thickener from pharmacy Follow Up Recommendations Acute inpatient rehab (3hours/day) Functional Status Assessment Patient has had a recent decline in their functional status and demonstrates the ability to make significant improvements in function in a reasonable and predictable amount of time.   08/08/2022   8:27 PM Frequency and Duration  Speech Therapy Frequency (ACUTE ONLY) min 2x/week Treatment Duration 2 weeks     08/08/2022   8:20 PM Oral Phase Oral Phase Impaired Oral - Honey Teaspoon Delayed oral transit;Premature spillage;Decreased bolus cohesion;Weak lingual manipulation Oral - Nectar Teaspoon Delayed oral transit;Premature spillage;Decreased bolus cohesion;Weak lingual manipulation;Left anterior bolus loss Oral - Nectar Cup Premature spillage;Decreased bolus cohesion;Weak lingual manipulation;Delayed oral transit Oral - Nectar Straw Weak lingual manipulation;Premature spillage;Decreased bolus cohesion;Delayed oral transit Oral - Thin Teaspoon Premature spillage;Weak lingual manipulation;Left anterior bolus loss;Decreased bolus cohesion Oral - Thin Cup Premature spillage;Weak lingual manipulation;Decreased bolus cohesion;Lingual/palatal residue Oral - Thin Straw Premature  spillage;Weak lingual manipulation;Left anterior bolus loss;Decreased bolus cohesion;Lingual/palatal residue Oral - Puree Lingual pumping;Weak lingual manipulation;Delayed oral transit;Premature spillage;Lingual/palatal residue Oral - Mech Soft Decreased bolus cohesion;Premature spillage;Impaired mastication;Weak lingual  manipulation;Reduced posterior propulsion;Pocketing in anterior sulcus;Lingual/palatal residue Oral - Pill Premature spillage;Decreased bolus cohesion;Reduced posterior propulsion;Weak lingual manipulation;Lingual/palatal residue Oral Phase - Comment oral suction provided before, during and after MBS, pt allows secretions to pool in pharynx; oral transiting delays up to 20 seconds - or as little as 4 seconds, oral retention spills into pharynx post-swallow    08/08/2022   8:23 PM Pharyngeal Phase Pharyngeal Phase Impaired Pharyngeal- Honey Teaspoon Delayed swallow initiation-pyriform sinuses;Delayed swallow initiation-vallecula Pharyngeal Material does not enter airway Pharyngeal- Nectar Teaspoon Delayed swallow initiation-pyriform sinuses Pharyngeal Material does not enter airway Pharyngeal- Nectar Cup Delayed swallow initiation-pyriform sinuses Pharyngeal Material does not enter airway Pharyngeal- Nectar Straw Delayed swallow initiation-pyriform sinuses Pharyngeal Material does not enter airway Pharyngeal- Thin Teaspoon Penetration/Aspiration during swallow;Delayed swallow initiation-pyriform sinuses Pharyngeal Material enters airway, remains ABOVE vocal cords and not ejected out Pharyngeal- Thin Cup Delayed swallow initiation-pyriform sinuses;Penetration/Aspiration during swallow Pharyngeal Material enters airway, remains ABOVE vocal cords and not ejected out Pharyngeal- Thin Straw Delayed swallow initiation-pyriform sinuses;Penetration/Aspiration during swallow Pharyngeal Material enters airway, remains ABOVE vocal cords and not ejected out Pharyngeal- Puree Delayed swallow initiation-vallecula Pharyngeal Material does not enter airway Pharyngeal- Mechanical Soft Delayed swallow initiation-pyriform sinuses;Delayed swallow initiation-vallecula Pharyngeal Material does not enter airway Pharyngeal- Pill Penetration/Apiration after swallow;Reduced laryngeal elevation;Reduced airway/laryngeal closure;Moderate  aspiration;Pharyngeal residue - pyriform Pharyngeal Material enters airway, passes BELOW cords and not ejected out despite cough attempt by patient Pharyngeal Comment Pharyngeal swallow severely delayed - triggering at pyriform sinus consistently requring counting cues to trigger swallow, aspiration occured with nectar mixed with secretions due to impaired coordination with swallow resulting in prolonged oral transiting - impaired adequacy of pharyngeal contraction with retention of nectar spilling into airway postswallow - causing reflexive cough.  Pt cleared large amount of aspiration with reflexive cough but minimal amount remained.    08/08/2022   8:27 PM Cervical Esophageal Phase  Cervical Esophageal Phase Impaired Barium tablet appeared to stall at distal esophagus, more liquids aided clearance. Pt also appeared with dilated esophagus with barium retention that appeared mixed with secretions.  Radiologist was not present and MBS does not diagnose below the UES.  Suspect component of esophageal motility deficits. Kathleen Lime, MS [facility] SLP Acute Rehab Services Office [phone] Macario Golds 08/08/2022, 8:51 PM                     DG Abd 1 View  Result Date: 08/08/2022 CLINICAL DATA:  Abdominal distension. EXAM: ABDOMEN - 1 VIEW COMPARISON:  Pelvic CT 09/21/2018.  Chest radiographs 08/07/2022. FINDINGS: 1054 hours. Two supine views are submitted. The bowel gas pattern is normal. No supine evidence of free intraperitoneal air. Possible right renal calculus. There are scattered vascular calcifications. The bladder appears mildly distended. No acute osseous findings are seen. There is telemetry leads overlie the abdomen and lower chest. IMPRESSION: No evidence of bowel obstruction or other acute findings. Possible right renal calculus and bladder distension. Electronically Signed   By: Richardean Sale M.D.   On: 08/08/2022 11:12   [name] CONTRAST  Result Date: 08/07/2022 CLINICAL DATA:  Transient ischemic  attack (TIA). Left facial droop. Nausea and vomiting. EXAM: MRI HEAD WITHOUT CONTRAST TECHNIQUE: Multiplanar, multiecho pulse sequences of the brain and surrounding structures were obtained without intravenous contrast. COMPARISON:  Head CT 08/07/2022  and MRI 02/24/2018 FINDINGS: Brain: There is a 4 mm acute infarct in the upper left medulla. No mass, midline shift, or extra-axial fluid collection is identified. A moderate-sized chronic right parietal infarct and chronic bilateral corona radiata/basal ganglia lacunar infarcts are again noted. There are chronic blood products associated with an old right basal ganglia infarct. T2 hyperintensities elsewhere in the cerebral white matter bilaterally have progressed from the prior MRI and are nonspecific but compatible with chronic small vessel ischemic disease. Chronic bilateral cerebellar and pontine infarcts are largely new from 2019. There is moderate cerebral atrophy. Vascular: Major intracranial vascular flow voids are preserved. Skull and upper cervical spine: Unremarkable bone marrow signal. Sinuses/Orbits: Bilateral cataract extraction. Paranasal sinuses and mastoid air cells are clear. Other: None. IMPRESSION: 1. 4 mm acute left medullary infarct. 2. Progressive chronic small vessel ischemic disease since 2019 with multiple chronic infarcts as above. Electronically Signed   By: Logan Bores M.D.   On: 08/07/2022 12:01   CT HEAD WO CONTRAST (5MM)  Result Date: 08/07/2022 CLINICAL DATA:  Transient ischemic attack.  Altered mental status. EXAM: CT HEAD WITHOUT CONTRAST TECHNIQUE: Contiguous axial images were obtained from the base of the skull through the vertex without intravenous contrast. RADIATION DOSE REDUCTION: This exam was performed according to the departmental dose-optimization program which includes automated exposure control, adjustment of the mA and/or kV according to patient size and/or use of iterative reconstruction technique. COMPARISON:   07/30/2022 FINDINGS: Brain: There is encephalomalacia of the RIGHT posterior parietal lobe. There are remote infarcts of the RIGHT corona radiata and bilateral basal ganglia. Significant periventricular white matter changes are present. There is central and cortical atrophy. There is a remote RIGHT cerebellar infarct. There is no intra or extra-axial fluid collection or mass lesion. The basilar cisterns and ventricles have a normal appearance. There is no CT evidence for acute infarction or hemorrhage. Vascular: There is dense atherosclerotic calcification of the internal carotid arteries. No hyperdense vessels. Skull: Normal. Negative for fracture or focal lesion. Sinuses/Orbits: No acute finding. Other: None. IMPRESSION: 1. Multiple remote infarcts. 2. Atrophy and small vessel disease. 3. No evidence for acute intracranial abnormality. Electronically Signed   By: Nolon Nations M.D.   On: 08/07/2022 10:50   DG Chest 2 View  Result Date: 08/07/2022 CLINICAL DATA:  Weakness. EXAM: CHEST - 2 VIEW COMPARISON:  09/22/2018 FINDINGS: Coarse lung markings are suggestive for chronic changes. No evidence for airspace disease or pulmonary edema. Heart and mediastinum are within normal limits. Atherosclerotic calcifications at the aortic arch. Irregularity of the posterior left seventh rib likely represents an old fracture. No pleural effusions. IMPRESSION: Chronic lung changes without acute findings. Electronically Signed   By: Markus Daft M.D.   On: 08/07/2022 10:06    Labs:  Basic Metabolic Panel: Recent Labs  Lab 09/02/22 0950 09/03/22 0556  NA 138  --   K 3.8  --   CL 107  --   CO2 21*  --   GLUCOSE 181*  --   BUN 43*  --   CREATININE 2.12* 2.10*  CALCIUM 8.5*  --     CBC: Recent Labs  Lab 08/28/22 0604 09/02/22 0950 09/03/22 0556  WBC 6.6 6.2 5.7  NEUTROABS 3.8  --  2.9  HGB 12.0 11.4* 11.4*  HCT 35.8* 35.4* 34.8*  MCV 82.3 84.1 83.5  PLT 252 295 270    CBG: Recent Labs  Lab  09/02/22 1137 09/02/22 1643 09/03/22 0729 09/03/22 1157 09/03/22 1624  GLUCAP  121* 119* 98 132* 174*   Family history.  Mother with kidney disease father with CHF and COPD maternal aunt with diabetes.  Denies any colon cancer esophageal cancer or rectal cancer  Brief HPI:   [name] is a 77 y.o. right-handed female with history significant for CVA with left-sided residual weakness maintained on low-dose aspirin receiving inpatient rehab services 02/27/2018 - 03/14/2018, hypertension hyperlipidemia type 2 diabetes mellitus CKD stage III ankle fracture 1 year ago wears an ankle brace recent UTI.  Per chart review patient lives with family and assistance as needed.  She was attending aquatics 2 times a week.  Daughter does help with some ADLs.  Presented 08/07/2018 for generalized weakness and decreased appetite.  Patient had initially been seen in the ER 1 week prior for generalized weakness CT at that time showed no acute findings.  UA showed WBC greater than 50,000 she was placed on antibiotic therapy and discharged to home.  Daughter noted worsening of left facial droop and slurred speech not able to keep food down and intermittent nausea vomiting.  She presented back to the ED with CT/MRI showing a 4 mm acute left medullary infarction.  Progressive chronic small vessel ischemic disease since 2019 with multiple chronic infarcts.  KUB showed no evidence of bowel obstruction or acute findings.  Incidental findings of nonobstructive renal stone.  Admission chemistries unremarkable except potassium 3.4 glucose 190 BUN 38 creatinine 1.63 WBC 13,700.  Urinalysis negative nitrite hemoglobin A1c 7.8.  Echocardiogram with ejection fraction of 60 to 65% no wall motion abnormality grade 2 diastolic dysfunction no regional wall motion abnormalities.  Neurology follow-up maintained on low-dose aspirin with the addition of Plavix for CVA prophylaxis.  Lovenox added for DVT prophylaxis then aspirin alone.  She is  currently on mechanical soft nectar thick liquid diet.  Hospital course bouts of urinary retention Foley tube maintained through 2/3 and removed but failed voiding trial replaced 2/4.  AKI on CKD stage IV creatinine increased to 2.0 suspect due to urinary retention placed on gentle IV hydration and losartan was held.  Therapy evaluations completed due to patient decreased functional mobility was admitted for a comprehensive rehab program.   Hospital Course: [name] was admitted to rehab 08/13/2022 for inpatient therapies to consist of PT, ST and OT at least three hours five days a week. Past admission physiatrist, therapy team and rehab RN have worked together to provide customized collaborative inpatient rehab.  Pertaining to patient's left lateral medullary infarction likely secondary small vessel disease as well as history of CVA 2019 with residual left-sided weakness maintained on aspirin and Plavix therapy.  MRI 08/20/2022 due to increased weakness and code stroke called showed acute infarct along the left anterior medial aspect of the medulla, medial to the area of acute infarction noted 08/07/2022.  She continued on aspirin Plavix therapy tapering to aspirin only.  Palliative care was consulted to establish goals of care.  She currently remains on a dysphagia #1 nectar thick liquid diet followed by speech therapy.  Permissive hypertension maintained on losartan daily and monitored.  AKI on CKD stage III with latest creatinine 1.86.  Patient had been on hydralazine discontinued due to emesis at daughter's request.  Bouts of urinary retention improved Flomax discontinued.  Blood sugars monitored hemoglobin A1c 7.0.  Bouts of constipation resolved with laxative assistance.  Hyperlipidemia Zetia/fenofibrate discontinued as per daughter's preference.  This would need outpatient follow-up.  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 boot suggesting  of Botox.  Patient did have some right ear numbness discussed secondary to recent MRI machine noise order for ENT referral as outpatient.   Blood pressures were monitored on TID basis and soft and monitored  Diabetes has been monitored with ac/hs CBG checks and SSI was use prn for tighter BS control.    Rehab course: During patient's stay in rehab weekly team conferences were held to monitor patient's progress, set goals and discuss barriers to discharge. At admission, patient required max assist sit to stand total assist sit to supine  Physical exam.  Blood pressure 182/80 pulse 74 temperature 97.4 respirations 18 oxygen saturation 98% room air Constitutional.  No acute distress HEENT.  Left facial droop Tongue.  Midline Eyes.  Pupils round and reactive to light Neck.  Supple nontender no JVD without thyromegaly Cardiac regular rate and rhythm without any extra sounds or murmur heard Abdomen.  Soft nontender positive bowel sounds without rebound Respiratory effort normal no respiratory distress without wheeze Musculoskeletal Comments.  Left upper extremity biceps 4/5 trace/5 it appears grip 2+/5 FA 2 -/5 Has severe left elbow and wrist flexion spasticity Left lower extremity 5 -/5 in hip flexors, KE, 4 -/5 DF and PF Right upper and right lower extremity 5/5 Neurologic.  Awake alert speech dysarthric but intelligible follows commands  He/She  has had improvement in activity tolerance, balance, postural control as well as ability to compensate for deficits. He/She has had improvement in functional use RUE/LUE  and RLE/LLE as well as improvement in awareness.  Discussed upcoming discharge plans to initiate further hands-on training education with family.  Patient's daughter reports she had been assisting patient with stand pivot transfers by performing a little dance to step patient's feet around.  Patient's daughter performed demonstration to transfer to allow therapist to to improve form  and technique to maintain both patient daughter safety.  Patient's daughter assisted patient with left stand pivot Tis wheelchair edge of bed via patient put her arms around patient's daughter shoulders and she assisted patient coming to stand underneath the patient's shoulder blades.  Sit to supine with max assist for trunk descent and bilateral lower extremity management.  Total assist required to wash hair.  Required heavy mod assist for squat pivot wheelchair edge of bed.  Worked on postural control using mirror for feedback and verbal cues to bring attention to area needing correction.  Speech therapy continue to attend the patient for dysphagia.  Min mod assist as meal progressed likely due to increased internal and external distractibility and decreased endurance.  Full family teaching completed plan discharge to home       Disposition: Discharge to home    Diet: Dysphagia 1 nectar liquids  Special Instructions: No driving smoking or alcohol  Maintain ankle-foot orthosis  Medihoney applied to both the right arm and hands and then apply bandage over apply thin layer to wound daily  Medications at discharge. 1.  Tylenol as needed 2.  Aspirin 81 mg p.o. daily 3.  Cozaar 25 mg p.o.  daily 4.  Melatonin 3 mg p.o. nightly as needed sleep 5.  Vitamin D 50,000 units every Wednesday 6. Scopolamine patch 1.5 mg every 72 hours 7.Plavix 75 mg daily until 09/12/2022 and stop   8.  Zithromax 250 mg daily x 4 doses  30-35 minutes were spent completing discharge summary and discharge planning  Discharge Instructions     Ambulatory referral to ENT   Complete  by: As directed    Eval and treat right ear hearing loss   Ambulatory referral to Neurology   Complete by: As directed    An appointment is requested in approximately: 4 weeks left lateral medullary infarction   Ambulatory referral to Physical Medicine Rehab   Complete by: As directed    Moderate complexity follow-up 1 to 2 weeks left  lateral medullary infarction        Follow-up Information     Raulkar, Clide Deutscher, MD Follow up.   Specialty: Physical Medicine and Rehabilitation Why: Office to call for appointment Contact information: [id number] [place] [address] [phone]                 Signed: Cathlyn Parsons 09/04/2022, 5:14 AM

## 2022-09-01 NOTE — Progress Notes (Signed)
Pts daughter only wanted her to take her bp medication and receive fluids for tonight. Daughter is at bedside and concerned about the excessive secretions that the patient is producing. The excessive secretions along with crushing the medications is causing the patient to choke and throw up. Pts daughter would like to know about a medication that can eliminate some of the secretions. No further concerns at this time, call bell in reach.

## 2022-09-01 NOTE — Progress Notes (Signed)
PROGRESS NOTE   Subjective/Complaints:  No events overnight.  No acute complaints. Ear pain resolved. Dizziness resolved.   ROS: +lethargy as per daughter-improving + Dizziness  -resolved , denies pain, CP, SOB, abd pain, HA,  cough, right sided lean improved. +fatigue- improved, insomnia improved, +LUE spasticity, +right hand skin tear- stable, +right ear numbness-resolved   Objective:   No results found. No results for input(s): "WBC", "HGB", "HCT", "PLT" in the last 72 hours.    No results for input(s): "NA", "K", "CL", "CO2", "GLUCOSE", "BUN", "CREATININE", "CALCIUM" in the last 72 hours.    Intake/Output Summary (Last 24 hours) at 09/01/2022 1511 Last data filed at 09/01/2022 1300 Gross per 24 hour  Intake 1819.58 ml  Output --  Net 1819.58 ml         Physical Exam: Vital Signs Blood pressure (!) 150/52, pulse 72, temperature 98.1 F (36.7 C), resp. rate 18, height '5\' 2"'$  (1.575 m), weight 64.7 kg, SpO2 99 %. Constitutional:      Comments: Frail appearing; awake, alert and awake, but very little speech; Sitting up in wheelchair; daughter at bedside, NAD, BMI 26.09 HENT: No right ear deformities noted    Head: Normocephalic and atraumatic.  Negative HIT bilaterally.  No nystagmus on EOMI.  VOR intact.    Comments: L facial droop- from prior stroke, but worsened later in day Tongue midline.     Right Ear: External ear normal.     Left Ear: External ear normal.     Nose: Nose normal. No congestion.     Mouth/Throat:     Mouth: Mucous membranes are moist    Pharynx: Oropharynx is clear. No oropharyngeal exudate.  Eyes:     General:        Right eye: No discharge.        Left eye: No discharge.     Extraocular Movements: Extraocular movements intact.     Comments: Nystagmus to L>R  Cardiovascular:     Rate and Rhythm: Normal rate and regular rhythm.     Heart sounds: Normal heart sounds. No murmur heard.     No gallop.  Pulmonary:     Effort: No respiratory distress.     Breath sounds: Normal breath sounds. No wheezing, rhonchi or rales. Good air movement. Abdominal:     General:normoactive BS. There is no distension.     Palpations: Abdomen is soft.     Tenderness: There is no abdominal tenderness.  Musculoskeletal:     Cervical back: Neck supple. No tenderness.     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  Speech is mildly dysarthric but intelligible.  Follows simple commands.    - unchanged  Psychiatric:     Comments: Flat, decreased interaction  GU: voiding on own! Total A for peri hygiene Skin: IV  in place  Assessment/Plan: 1. Functional deficits which require 3+ hours per day of interdisciplinary therapy in a comprehensive inpatient rehab setting. Physiatrist is providing close team supervision and 24 hour management of active medical problems listed below. Physiatrist and rehab team continue to assess barriers to discharge/monitor patient progress toward functional and medical goals  Care Tool:  Bathing    Body parts bathed by patient: Left arm, Chest, Abdomen, Right upper leg, Left upper leg, Face   Body parts bathed by helper: Right arm, Front perineal area, Buttocks, Right lower leg, Left lower leg     Bathing assist Assist Level: Moderate Assistance - Patient 50 - 74%     Upper Body Dressing/Undressing Upper body dressing   What is the patient wearing?: Pull over shirt    Upper body assist Assist Level: Moderate Assistance - Patient 50 - 74%    Lower Body Dressing/Undressing Lower body dressing      What is the  patient wearing?: Underwear/pull up, Pants     Lower body assist Assist for lower body dressing: Maximal Assistance - Patient 25 - 49%     Toileting Toileting    Toileting assist Assist for toileting: 2 Helpers     Transfers Chair/bed transfer  Transfers assist     Chair/bed transfer assist level: Maximal Assistance - Patient 25 - 49%     Locomotion Ambulation   Ambulation assist   Ambulation activity did not occur: Safety/medical concerns  Assist level: 2 helpers Assistive device: Lite Gait Max distance: 76f   Walk 10 feet activity   Assist  Walk 10 feet activity did not occur: Safety/medical concerns  Assist level: 2 helpers Assistive device: Lite Gait   Walk 50 feet activity   Assist Walk 50 feet with 2 turns activity did not occur: Safety/medical concerns         Walk 150 feet activity   Assist Walk 150 feet activity did not occur: Safety/medical concerns         Walk 10 feet on uneven surface  activity   Assist Walk 10 feet on uneven surfaces activity did not occur: Safety/medical concerns         Wheelchair     Assist Is the patient using a wheelchair?: Yes Type of Wheelchair: Manual    Wheelchair assist level: Supervision/Verbal cueing Max wheelchair distance: 172f   Wheelchair 50 feet with 2 turns activity    Assist        Assist Level: Maximal Assistance - Patient 25 - 49%   Wheelchair 150 feet activity     Assist      Assist Level: Total Assistance - Patient < 25%   Blood pressure (!) 150/52, pulse 72, temperature 98.1 F (36.7 C), resp. rate 18, height '5\' 2"'$  (1.575 m), weight 64.7 kg, SpO2 99 %.  Medical Problem List and Plan: 1. 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  Impaired mobility: daughter notes mom  does not tolerate lovenox, d/ced, SCDs ordered.            Continue Aspirin 81 mg daily, plavix for 3 weeks.  3. Pain: N/A. Oxycodone discontinued. 4. Insomnia: resolved. Trazodone and zoloft d/ced 5. Neuropsych/cognition: This patient is? capable of making decisions on her own behalf. 6. Skin/Wound Care: Routine skin checks  -Protective cream to b/l heels 7. Fluids/Electrolytes/Nutrition: Routine in and outs with follow-up chemistries 8.  Dysphagia.  Downgraded to D1/nectar, discussed with SLP and daughter. Reviewed daughter's note that swallowing is improved. Discussed D1/nectar thick options for home, recommended smoothies as a nutritious option 9.  Hypertension.  Hydralazine d/ced due to emesis as per daughter's request. Losartan recently held due to AKI, but restarted at '25mg'$  BID. Additional '25mg'$  daily prn added for SBP >180. Daughter prefers slow reduction of BP- at baseline she runs Q000111Q systolic 10.  Acute urinary retention.  d/c flomax. Resolved.  11.  AKI on CKD stage III.  Baseline creatinine 1.9-2.0.  Cr reviewed from 2/13 and is better than baseline, Discussed with daughter that most recent creatinine on 2/20 continues to be better than baseline. Continue IVF over night- please renew nightly over the weekend -Order nightly at bedtime, 50 cc/h  12.  Diabetes mellitus.  Hemoglobin A1c 7.0, discussed with daughter.  CBGs being monitor and reviewed, appear stable. Placed order for no juice. Provided dietary education.  Recent Labs    08/31/22 2055 09/01/22 0544 09/01/22 1114  GLUCAP 213* 122* 141*    - 2/25: Blood sugars well-controlled, monitor  13.  History of right ankle fracture.   Patient does wear an ankle stabilizing brace due to previous ankle fx. . 14.  Hyperlipidemia.  Zetia/fenofibrate d/ced as per daughter's preference. Lipid panel repeated and reviewed with daughter.  15.  Constipation.  Miralax d/ced as per daughter's preference. Senokot 2 tabs at bedtime as needed.  Sorbitol caused side  16. 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 Kpad ordered. Discuss oral spasticity medications  - no complaints/discomfort 2/24-25  17. Suboptimal vitamin D: continue ergocalciferol 50,000U once per week for 7 weeks, discussed that this can help with fatigue 18. Anemia: repeat Hgb tomorrow. ->  2-24: Stable, 12.0 19. CKD: restarted fluids at night - ongoing  20. Fatigue: discussed amantadine and modafinil 21. HTN: increase losartan to 19mBID  -Elevated, has as needed for SBP greater than 180, monitor    09/01/2022    2:13 PM 09/01/2022   10:47 AM 09/01/2022    3:09 AM  Vitals with BMI  Systolic [id number] Diastolic 52 55 53  Pulse 72 65 71     22. New acute right medullary infarct : continue aspirin/plavix 21 days from 2/13  23. Right ear numbness: discussed could be secondary to MRI machine noise, will order outpatient ENT eval for her  24. Right ear pain: will add Cortisporin ear drops 3 drops q8H. resolved -2-24: Drops discontinued due to ?vertiginous symptoms.  No further ear pain. 2/25: resolved      [place] 3[place]11 [place]

## 2022-09-01 NOTE — Progress Notes (Signed)
Occupational Therapy Session Note  Patient Details  Name: [name] MRN: [id number] Date of Birth: 25-Feb-1946  Today's Date: 09/01/2022 OT Individual Time: 0900-1015 OT Individual Time Calculation (min): 75 min    Short Term Goals: Week 3:  OT Short Term Goal 1 (Week 3): Pt will transfer to [facility] with +1 A and LRAD OT Short Term Goal 2 (Week 3): Patient will maintain standing or assist with bridging to allow one caregiver assist for toileting OT Short Term Goal 3 (Week 3): Patient will sit without support x several minutes in preparation for edge of bed to wheelchair transfer OT Short Term Goal 4 (Week 3): Patient will don a pull over shirt with cueing following set up  Skilled Therapeutic Interventions/Progress Updates:    Pt sitting in TIS w/c upon OT arrival. Per dtr, requesting to wash her hair, but apprehensive towards showering due to last time not feeling confident during shower.  Pt opted to have hair washed at sink using washboard.  Total assist required to complete.  Pt transported to gym after and worked on block practice squat pivots and repositioning self anterior and lateral directions.  Pt required heavy mod assist for squat pivot w/c<>EOM.  Once sitting EOM, pt worked on Writer for feedback and Vcs to bring attention to area needing correction.  When pt attended to posture, she was able to independently correct. Dynamic sitting balance training completed using anterior and left forward reaching to grasp and place horseshoes.  Returned to w/c with mod assist and transported back to room.  Tilted posteriorly for safety, call bell in reach, safety belt on.  Notified nurse tech of pts request to finish breakfast due to needing full time supervision.  Therapy Documentation Precautions:  Precautions Precautions: Fall Precaution Comments: premorbid L hemi Required Braces or Orthoses: Other Brace Other Brace: R ankle stabilizing brace from prior ankle  fx Restrictions Weight Bearing Restrictions: Yes RLE Weight Bearing: Weight bearing as tolerated LLE Weight Bearing: Weight bearing as tolerated Other Position/Activity Restrictions: Old L sided weakness from a prior CVA from ~3 years ago. History of R abkle fracture ~1-1.5 yr ago  Therapy/Group: Individual Therapy  Ezekiel Slocumb 09/01/2022, 8:14 AM

## 2022-09-01 NOTE — Progress Notes (Signed)
Physical Therapy Session Note  Patient Details  Name: [name] MRN: [id number] Date of Birth: 1945/09/05  Today's Date: 09/01/2022 PT Individual Time: 1300-1349 PT Individual Time Calculation (min): 49 min   Short Term Goals: Week 3:  PT Short Term Goal 1 (Week 3): STG = LTG due to ELOS  Skilled Therapeutic Interventions/Progress Updates:      Therapy Documentation Precautions:  Precautions Precautions: Fall Precaution Comments: premorbid L hemi Required Braces or Orthoses: Other Brace Other Brace: R ankle stabilizing brace from prior ankle fx Restrictions Weight Bearing Restrictions: Yes RLE Weight Bearing: Weight bearing as tolerated LLE Weight Bearing: Weight bearing as tolerated Other Position/Activity Restrictions: Old L sided weakness from a prior CVA from ~3 years ago. History of R abkle fracture ~1-1.5 yr ago   Pt received seated in w/c at bedside, agreeable to PT session and without reports of pain. Pt transported total A for time management to dayroom and requires max A with squat pivot transfers throughout session. Pt attempted sit to stand with PT anterior to patient and presents with decreased posterior chain activation despite verbal, visual feedback from mirror, and tactile cueing at the gluteal muscles. Pt requires mod A with sit to lying and performed 1 x 10 gluteal bridges to increase posterior chain activation to better transfers. Pt incontinent of bowel with activity and transported to room. Pt mod A with rolling and total A for pericare. Pt left seated in w/c at bedside with chair alarm on, all needs in reach and nurse present.    Therapy/Group: Individual Therapy  Verl Dicker Verl Dicker PT, DPT  09/01/2022, 7:56 AM

## 2022-09-02 LAB — TYPE AND SCREEN
ABO/RH(D): A POS
Antibody Screen: NEGATIVE

## 2022-09-02 LAB — CBC
HCT: 35.4 % — ABNORMAL LOW (ref 36.0–46.0)
Hemoglobin: 11.4 g/dL — ABNORMAL LOW (ref 12.0–15.0)
MCH: 27.1 pg (ref 26.0–34.0)
MCHC: 32.2 g/dL (ref 30.0–36.0)
MCV: 84.1 fL (ref 80.0–100.0)
Platelets: 295 10*3/uL (ref 150–400)
RBC: 4.21 MIL/uL (ref 3.87–5.11)
RDW: 16 % — ABNORMAL HIGH (ref 11.5–15.5)
WBC: 6.2 10*3/uL (ref 4.0–10.5)
nRBC: 0 % (ref 0.0–0.2)

## 2022-09-02 LAB — BASIC METABOLIC PANEL
Anion gap: 10 (ref 5–15)
BUN: 43 mg/dL — ABNORMAL HIGH (ref 8–23)
CO2: 21 mmol/L — ABNORMAL LOW (ref 22–32)
Calcium: 8.5 mg/dL — ABNORMAL LOW (ref 8.9–10.3)
Chloride: 107 mmol/L (ref 98–111)
Creatinine, Ser: 2.12 mg/dL — ABNORMAL HIGH (ref 0.44–1.00)
GFR, Estimated: 24 mL/min — ABNORMAL LOW (ref >60)
Glucose, Bld: 181 mg/dL — ABNORMAL HIGH (ref 70–99)
Potassium: 3.8 mmol/L (ref 3.5–5.1)
Sodium: 138 mmol/L (ref 135–145)

## 2022-09-02 LAB — GLUCOSE, CAPILLARY
Glucose-Capillary: 155 mg/dL — ABNORMAL HIGH (ref 70–99)
Glucose-Capillary: 121 mg/dL — ABNORMAL HIGH (ref 70–99)
Glucose-Capillary: 119 mg/dL — ABNORMAL HIGH (ref 70–99)

## 2022-09-02 LAB — ABO/RH: ABO/RH(D): A POS

## 2022-09-02 MED ORDER — CLOPIDOGREL BISULFATE 75 MG PO TABS
75.0000 mg | ORAL_TABLET | Freq: Every day | ORAL | Status: DC
  Filled 2022-09-02: qty 1

## 2022-09-02 MED ORDER — SCOPOLAMINE 1 MG/3DAYS TD PT72
1.0000 | MEDICATED_PATCH | TRANSDERMAL | Status: DC
  Administered 2022-09-02: 1.5 mg via TRANSDERMAL
  Filled 2022-09-02: qty 1

## 2022-09-02 NOTE — Progress Notes (Signed)
PROGRESS NOTE   Subjective/Complaints: Discussed excessive secretions and trying a scopolamine patch- ordered BP better controlled this morning She would like fluids tonight  ROS: +lethargy as per daughter-improving + Dizziness  -resolved , denies pain, CP, SOB, abd pain, HA,  cough, right sided lean improved. +fatigue- improved, insomnia improved, +LUE spasticity, +right hand skin tear- stable, +right ear numbness-resolved, +excessive secretions   Objective:   No results found. No results for input(s): "WBC", "HGB", "HCT", "PLT" in the last 72 hours.    No results for input(s): "NA", "K", "CL", "CO2", "GLUCOSE", "BUN", "CREATININE", "CALCIUM" in the last 72 hours.    Intake/Output Summary (Last 24 hours) at 09/02/2022 0953 Last data filed at 09/01/2022 1700 Gross per 24 hour  Intake 591 ml  Output --  Net 591 ml        Physical Exam: Vital Signs Blood pressure (!) 161/57, pulse 78, temperature 97.7 F (36.5 C), resp. rate 16, height '5\' 2"'$  (1.575 m), weight 64.7 kg, SpO2 95 %. Constitutional:      Comments: Frail appearing; awake, alert and awake, but very little speech; Sitting up in wheelchair; daughter at bedside, NAD, BMI 26.09 HENT: No right ear deformities noted    Head: Normocephalic and atraumatic.  Negative HIT bilaterally.  No nystagmus on EOMI.  VOR intact. +excessive secretions    Comments: L facial droop- from prior stroke, but worsened later in day Tongue midline.     Right Ear: External ear normal.     Left Ear: External ear normal.     Nose: Nose normal. No congestion.     Mouth/Throat:     Mouth: Mucous membranes are moist    Pharynx: Oropharynx is clear. No oropharyngeal exudate.  Eyes:     General:        Right eye: No discharge.        Left eye: No discharge.     Extraocular Movements: Extraocular movements intact.     Comments: Nystagmus to L>R  Cardiovascular:     Rate and Rhythm:  Normal rate and regular rhythm.     Heart sounds: Normal heart sounds. No murmur heard.    No gallop.  Pulmonary:     Effort: No respiratory distress.     Breath sounds: Normal breath sounds. No wheezing, rhonchi or rales. Good air movement. Abdominal:     General:normoactive BS. There is no distension.     Palpations: Abdomen is soft.     Tenderness: There is no abdominal tenderness.  Musculoskeletal:     Cervical back: Neck supple. No tenderness.     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  Speech is mildly dysarthric but intelligible.  Follows simple commands.    - unchanged  Psychiatric:     Comments: Flat, decreased interaction  GU: voiding on  own! Total A for peri hygiene Skin: IV in place  Assessment/Plan: 1. Functional deficits which require 3+ hours per day of interdisciplinary therapy in a comprehensive inpatient rehab setting. Physiatrist is providing close team supervision and 24 hour management of active medical problems listed below. Physiatrist and rehab team continue to assess barriers to discharge/monitor patient progress toward functional and medical goals  Care Tool:  Bathing    Body parts bathed by patient: Left arm, Chest, Abdomen, Right upper leg, Left upper leg, Face   Body parts bathed by helper: Right arm, Front perineal area, Buttocks, Right lower leg, Left lower leg     Bathing assist Assist Level: Moderate Assistance - Patient 50 - 74%     Upper Body Dressing/Undressing Upper body dressing   What is the patient wearing?: Pull over shirt    Upper body assist Assist Level: Moderate Assistance -  Patient 50 - 74%    Lower Body Dressing/Undressing Lower body dressing      What is the patient wearing?: Underwear/pull up, Pants     Lower body assist Assist for lower body dressing: Maximal Assistance - Patient 25 - 49%     Toileting Toileting    Toileting assist Assist for toileting: 2 Helpers     Transfers Chair/bed transfer  Transfers assist     Chair/bed transfer assist level: Maximal Assistance - Patient 25 - 49%     Locomotion Ambulation   Ambulation assist   Ambulation activity did not occur: Safety/medical concerns  Assist level: 2 helpers Assistive device: Lite Gait Max distance: 23f   Walk 10 feet activity   Assist  Walk 10 feet activity did not occur: Safety/medical concerns  Assist level: 2 helpers Assistive device: Lite Gait   Walk 50 feet activity   Assist Walk 50 feet with 2 turns activity did not occur: Safety/medical concerns         Walk 150 feet activity   Assist Walk 150 feet activity did not occur: Safety/medical concerns         Walk 10 feet on uneven surface  activity   Assist Walk 10 feet on uneven surfaces activity did not occur: Safety/medical concerns         Wheelchair     Assist Is the patient using a wheelchair?: Yes Type of Wheelchair: Manual    Wheelchair assist level: Supervision/Verbal cueing Max wheelchair distance: 144f   Wheelchair 50 feet with 2 turns activity    Assist        Assist Level: Maximal Assistance - Patient 25 - 49%   Wheelchair 150 feet activity     Assist      Assist Level: Total Assistance - Patient < 25%   Blood pressure (!) 161/57, pulse 78, temperature 97.7 F (36.5 C), resp. rate 16, height '5\' 2"'$  (1.575 m), weight 64.7 kg, SpO2 95 %.  Medical Problem List and Plan: 1. 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  Impaired mobility: daughter notes mom does not tolerate lovenox, d/ced, SCDs ordered.            Continue Aspirin 81 mg daily, plavix for 3 weeks.  3. Pain: N/A. Oxycodone discontinued. 4. Insomnia: resolved. Trazodone and zoloft d/ced 5. Neuropsych/cognition: This patient is? capable of making decisions on her own behalf. 6. Skin/Wound Care: Routine skin checks  -Protective cream to b/l heels 7. Fluids/Electrolytes/Nutrition: Routine in and outs with follow-up chemistries 8.  Dysphagia.  Downgraded to D1/nectar, discussed with SLP and daughter. Reviewed daughter's note that swallowing is improved. Discussed D1/nectar thick options for home, recommended smoothies as a nutritious option 9.  Hypertension.  Hydralazine d/ced due to emesis as per daughter's request. Losartan recently held due to AKI, but restarted at '25mg'$  BID. Additional '25mg'$  daily prn added for SBP >180. Daughter prefers slow reduction of BP- at baseline she runs Q000111Q systolic 10.  Acute urinary retention.  d/c flomax. Resolved.  11.  AKI on CKD stage III.  Baseline creatinine 1.9-2.0.  Cr reviewed from 2/13 and is better than baseline, Discussed with daughter that most recent creatinine on 2/20 continues to be better than baseline. Continue IVF over night- please renew nightly over the weekend -Order nightly at bedtime, 50 cc/h  12.  Diabetes mellitus.  Hemoglobin A1c 7.0, discussed with daughter.  CBGs being monitor and reviewed, appear stable. Placed order for no juice. Provided dietary education.  Recent Labs    09/01/22 1628 09/01/22 2111 09/02/22 0629  GLUCAP 184* 205* 155*   - 2/25:  Blood sugars well-controlled, monitor  13.  History of right ankle fracture.  Patient does wear an ankle stabilizing brace due to previous ankle fx. . 14.  Hyperlipidemia.  Zetia/fenofibrate d/ced as per daughter's preference. Lipid panel repeated and reviewed with daughter.  15.  Constipation.  Miralax d/ced as per daughter's preference. Senokot 2 tabs at bedtime as needed.  Sorbitol caused side  16. 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 Kpad ordered. Discuss oral spasticity medications  - no complaints/discomfort 2/24-25  17. Suboptimal vitamin D: continue ergocalciferol 50,000U once per week for 7 weeks, discussed that this can help with fatigue 18. Anemia: repeat Hgb tomorrow. ->  2-24: Stable, 12.0 19. CKD: restarted fluids at night - ongoing  20. Fatigue: discussed amantadine and modafinil 21. HTN: increase losartan to 38mBID  -Elevated, has as needed for SBP greater than 180, monitor. Discussed improved overall control.     09/02/2022    4:42 AM 09/02/2022   12:52 AM 09/01/2022    8:11 PM  Vitals with BMI  Systolic [id number] Diastolic 57 96 70  Pulse 78 98 85     22. New acute right medullary infarct : continue aspirin/plavix 21 days from 2/13  23. Right ear numbness: discussed could be secondary to MRI machine noise, will order outpatient ENT eval for her  24. Right ear pain: resolved with Cortisporin drops .  25. Excessive secretions: scopolamine patch ordered.       LOS: 20 days A FACE TO FACE EVALUATION WAS PERFORMED  KIzora Ribas2/26/2024, 9:53 AM

## 2022-09-02 NOTE — Progress Notes (Signed)
Patient ID: [name], female   DOB: [birth year], 77 y.o.   MRN: [id number]  Have ordered a tub bench per daughter;s request and [facility] here to introduce self and will be providing home health services too. Has order for follow up. Working toward discharge 2/28. Daughter here daily and participating in therapies

## 2022-09-02 NOTE — Progress Notes (Signed)
Speech Language Pathology Daily Session Note  Patient Details  Name: [name] MRN: [id number] Date of Birth: 26-Aug-1945  Today's Date: 09/02/2022 SLP Individual Time: [id number] SLP Individual Time Calculation (min): 42 min  Short Term Goals: Week 3: SLP Short Term Goal 1 (Week 3): STG's = LTG's due to ELOS  Skilled Therapeutic Interventions: Skilled treatment session focused on speech intelligibility goals. Upon arrival, patient was awake while semi-reclined in bed with her daughter present. Patient independently recalled 75% of speech intelligibility strategies with Min verbal cues needed to recall all strategies. Patient participated in a novel, complex verbal task in which patient had to communicate at the sentence level. Patient was ~80% intelligible with Min-Mod verbal cues needed for breath support as patient would often require cues to stop and take a breath prior to completing her sentence. Patient also independently verbalized one goal that she is working on with each discipline. Patient also communicated at the phrase level with unfamiliar listeners without any communication breakdown noted. Patient left upright in bed with alarm on and family present. Continue with current plan of care.      Pain No/Denies Pain   Therapy/Group: Individual Therapy  Khalise Billard 09/02/2022, 2:37 PM

## 2022-09-02 NOTE — Progress Notes (Signed)
Occupational Therapy Session Note  Patient Details  Name: [name] MRN: [id number] Date of Birth: Sep 12, 1945  Today's Date: 09/02/2022 OT Individual Time: 0805-0900 OT Individual Time Calculation (min): 55 min    Short Term Goals: Week 3:  OT Short Term Goal 1 (Week 3): Pt will transfer to [facility] with +1 A and LRAD OT Short Term Goal 2 (Week 3): Patient will maintain standing or assist with bridging to allow one caregiver assist for toileting OT Short Term Goal 3 (Week 3): Patient will sit without support x several minutes in preparation for edge of bed to wheelchair transfer OT Short Term Goal 4 (Week 3): Patient will don a pull over shirt with cueing following set up  Skilled Therapeutic Interventions/Progress Updates:    Patient received seated in wheelchair, daughter at bedside feeding her breakfast.  Daughter reports terrible night last night with neither of them getting sleep - patient vomiting.  Indicated likely due to medication versus illness. Patient agreeable for OT session.  Transported to ADL apartment for tub shower transfer.  Worked on actually completing transfer.  Patient in new loaner wheelchair which does not allow her feet to touch the floor.  Worked on scooting forward using flexion versus extension.  Continue to work to improve more neutral position of pelvis and less thoracic flexion to increase weight translation from seat to feet.  Patient better abe to lift off chair, and needing min guidance - multi scoot to direct hips left/ right.  Patient able to place legs into tub with only min assist.  Patient with immature balance response with all lateral weight shifting either right or left.  Patient transported to gym to address use of grab bar for sit to partial stand.   Returned to room, daughter resting.  Shared information regarding OT session and tub transfer bench.  Discussed issues with loaner chair - demonstrated ways tOo assist patient scoot back without use of feet  on floor.  Left patient up in wheelchair - daughter at bedside.  Call bell/ personal items in reach.    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 History of R abkle fracture ~1-1.5 yr ago  Pain: Pain Assessment Pain Scale: 0-10 Pain Score: 0-No pain  Therapy/Group: Individual Therapy  Mariah Milling 09/02/2022, 12:06 PM

## 2022-09-02 NOTE — Progress Notes (Signed)
Physical Therapy Session Note  Patient Details  Name: [name] MRN: [id number] Date of Birth: 27-Sep-1945  Today's Date: 09/02/2022 PT Individual Time: [id number] PT Individual Time Calculation (min): 25 min   Short Term Goals: Week 3:  PT Short Term Goal 1 (Week 3): STG = LTG due to ELOS  Skilled Therapeutic Interventions/Progress Updates:    Chart reviewed and pt agreeable to therapy. Pt received semi-reclined in bed with no c/o pain. Session focused on functional mobility to promote independence in the home. Pt initiated session with resisted hip ext in bed for 2x10. Pt then found to be soiled after BM. Pt completed 8 R/L rolls with MinA and multimodal cues for turning in bed. Pt skin found to be irritated, so LPN notified and entered room to assess skin. At end of session, pt was left semi-reclined in bed with alarm engaged, nurse call bell and all needs in reach.     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 History of R abkle fracture ~1-1.5 yr ago     Therapy/Group: Individual Therapy  Marquette Old, PT, DPT 09/02/2022, 3:06 PM

## 2022-09-03 ENCOUNTER — Other Ambulatory Visit (HOSPITAL_COMMUNITY): Payer: Self-pay

## 2022-09-03 LAB — GLUCOSE, CAPILLARY
Glucose-Capillary: 98 mg/dL (ref 70–99)
Glucose-Capillary: 132 mg/dL — ABNORMAL HIGH (ref 70–99)
Glucose-Capillary: 174 mg/dL — ABNORMAL HIGH (ref 70–99)

## 2022-09-03 LAB — CBC WITH DIFFERENTIAL/PLATELET
Abs Immature Granulocytes: 0.01 10*3/uL (ref 0.00–0.07)
Basophils Absolute: 0.1 10*3/uL (ref 0.0–0.1)
Basophils Relative: 1 %
Eosinophils Absolute: 0.4 10*3/uL (ref 0.0–0.5)
Eosinophils Relative: 6 %
HCT: 34.8 % — ABNORMAL LOW (ref 36.0–46.0)
Hemoglobin: 11.4 g/dL — ABNORMAL LOW (ref 12.0–15.0)
Immature Granulocytes: 0 %
Lymphocytes Relative: 31 %
Lymphs Abs: 1.8 10*3/uL (ref 0.7–4.0)
MCH: 27.3 pg (ref 26.0–34.0)
MCHC: 32.8 g/dL (ref 30.0–36.0)
MCV: 83.5 fL (ref 80.0–100.0)
Monocytes Absolute: 0.7 10*3/uL (ref 0.1–1.0)
Monocytes Relative: 12 %
Neutro Abs: 2.9 10*3/uL (ref 1.7–7.7)
Neutrophils Relative %: 50 %
Platelets: 270 10*3/uL (ref 150–400)
RBC: 4.17 MIL/uL (ref 3.87–5.11)
RDW: 16.1 % — ABNORMAL HIGH (ref 11.5–15.5)
WBC: 5.7 10*3/uL (ref 4.0–10.5)
nRBC: 0 % (ref 0.0–0.2)

## 2022-09-03 LAB — CREATININE, SERUM
Creatinine, Ser: 2.1 mg/dL — ABNORMAL HIGH (ref 0.44–1.00)
GFR, Estimated: 24 mL/min — ABNORMAL LOW (ref >60)

## 2022-09-03 MED ORDER — SENNOSIDES-DOCUSATE SODIUM 8.6-50 MG PO TABS: 1.0000 | ORAL_TABLET | Freq: Every day | ORAL | Status: DC

## 2022-09-03 MED ORDER — LOSARTAN POTASSIUM 25 MG PO TABS
25.0000 mg | ORAL_TABLET | Freq: Every day | ORAL | 0 refills | Status: DC
  Filled 2022-09-03 – 2022-09-22 (×3): qty 30, 30d supply, fill #0

## 2022-09-03 MED ORDER — VITAMIN D (ERGOCALCIFEROL) 1.25 MG (50000 UNIT) PO CAPS
50000.0000 [IU] | ORAL_CAPSULE | ORAL | 0 refills | Status: DC
  Filled 2022-09-03: qty 5, 35d supply, fill #0

## 2022-09-03 MED ORDER — ACETAMINOPHEN 325 MG PO TABS: 650.0000 mg | ORAL_TABLET | ORAL | Status: DC | PRN

## 2022-09-03 MED ORDER — MELATONIN 3 MG PO TABS
3.0000 mg | ORAL_TABLET | Freq: Every evening | ORAL | 0 refills | Status: DC | PRN
  Filled 2022-09-03: qty 30, 30d supply, fill #0

## 2022-09-03 MED ORDER — SCOPOLAMINE 1 MG/3DAYS TD PT72
1.0000 | MEDICATED_PATCH | TRANSDERMAL | 0 refills | Status: DC
  Filled 2022-09-03: qty 10, 30d supply, fill #0

## 2022-09-03 MED ORDER — ASPIRIN 81 MG PO CHEW: 81.0000 mg | CHEWABLE_TABLET | Freq: Every day | ORAL | Status: DC

## 2022-09-03 MED ORDER — LOSARTAN POTASSIUM 50 MG PO TABS
25.0000 mg | ORAL_TABLET | Freq: Every day | ORAL | Status: DC
  Filled 2022-09-03: qty 1

## 2022-09-03 MED ORDER — CLOPIDOGREL BISULFATE 75 MG PO TABS
75.0000 mg | ORAL_TABLET | Freq: Every day | ORAL | 0 refills | Status: AC
  Filled 2022-09-03: qty 9, 9d supply, fill #0

## 2022-09-03 MED ORDER — LOSARTAN POTASSIUM 25 MG PO TABS
25.0000 mg | ORAL_TABLET | Freq: Two times a day (BID) | ORAL | 0 refills | Status: DC
  Filled 2022-09-03: qty 60, 30d supply, fill #0

## 2022-09-03 NOTE — Progress Notes (Signed)
Physical Therapy Discharge Summary  Patient Details  Name: [name] MRN: [id number] Date of Birth: 1946-04-16  Date of Discharge from PT service:September 03, 2022  Today's Date: 09/03/2022 PT Individual Time: 1101-1200 PT Individual Time Calculation (min): 59 min    Patient has met 1 of 6 long term goals due to increased strength.  Patient to discharge at a wheelchair level w/ Mod A.   Patient's care partner is independent to provide the necessary physical assistance at discharge.  Reasons goals not met: Pt still requires max to mod A for transfers and bed mobility 2/2 continued hemiplegia.  Pt able to amb x 30' in [place] for LE unloading, but requires assist of 2.  Pt has transferred sit to stand w/ RW and A +2.  Pt has been given loaner TIS w/c until customized w/c arrives at home (~3 mos?) and is too high for LES to assist w/ w/c mobility.    Dtr has been trained in transfers and perfoming transfers w/c <> bed.  Educated on car transfers w/ max A 2/2 fatigue.  Pt will follow w/ outpatient PT services per dtr.  Recommendation:  Patient will benefit from ongoing skilled PT services in outpatient setting to continue to advance safe functional mobility, address ongoing impairments in strengthening, balance, NMR, and minimize fall risk.  Equipment: Personalized W/C  Reasons for discharge: discharge from hospital and family training performed w/ daughter  for safe transfers.  Patient/family agrees with progress made and goals achieved: Yes  PT Discharge Pt presents sitting in TIS and agreeable to therapy.  Pt unable to reach ground w/ feet to propel w/c on loaner chair.  PtT discussed transfers w/ dtr in room and comfortable w/ safety.  Dtr unhappy w/ use of loaner w/c until customized one arrives at home.  Pt wheeled to small gym for car transfer training.  Pt requires max A for SPT w/c <> car w/ blocking of L knee.  Pt states increased fatigue.  Dtr unable to assist w/ transfer for  safety.  Discussed methods to improve safety w/ transfer, similar to SPT or step-pivot w/c <> bed, but decreased space available w/ car door.  PT suggests removing arm rest, moving car seat back and reclining back of seat.  Pt returned to room and reclined in TIS for pressure relief.  Discussed/reiterated reclining for pressure relief.  Dtr remains in room and all needs in reach.   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 History of R abkle fracture ~1-1.5 yr ago Vital Signs Therapy Vitals Pulse Rate: (!) 58 BP: (!) 150/59 Pain Pain Assessment Pain Scale: 0-10 Pain Score: 0-No pain Pain Interference Pain Interference Pain Effect on Sleep: 0. Does not apply - I have not had any pain or hurting in the past 5 days Pain Interference with Therapy Activities: 0. Does not apply - I have not received rehabilitationtherapy in the past 5 days Pain Interference with Day-to-Day Activities: 1. Rarely or not at all Vision/Perception  Vision - History Ability to See in Adequate Light: 0 Adequate Perception Perception: Impaired Inattention/Neglect: Does not attend to left side of body Spatial Orientation: fearful of weight shifts off midline Praxis Praxis: Impaired Praxis  Impairment Details: Initiation;Motor planning  Cognition Overall Cognitive Status: Impaired/Different from baseline Arousal/Alertness: Awake/alert Orientation Level: Oriented X4 Attention: Selective Focused Attention: Appears intact Sustained Attention: Appears intact Selective Attention: Impaired Selective Attention Impairment:  Functional basic Memory: Impaired Memory Impairment: Retrieval deficit;Decreased recall of new information Awareness: Impaired Awareness Impairment: Emergent impairment Problem Solving: Appears intact Executive Function: Reasoning;Sequencing;Organizing;Decision Making;Initiating Reasoning: Impaired Reasoning Impairment: Functional basic Sequencing: Impaired Sequencing Impairment: Functional basic Organizing: Impaired Organizing Impairment: Functional basic Decision Making: Impaired Decision Making Impairment: Functional basic Initiating: Impaired Initiating Impairment: Functional basic Behaviors: Poor frustration tolerance Safety/Judgment: Impaired Sensation Sensation Light Touch: Appears Intact Hot/Cold: Appears Intact Proprioception: Impaired by gross assessment Proprioception Impaired Details: Impaired LLE;Impaired RLE Stereognosis: Not tested Coordination Gross Motor Movements are Fluid and Coordinated: No Fine Motor Movements are Fluid and Coordinated: No Coordination and Movement Description: L hemi (premorbid). UE > LE Heel Shin Test: NA Motor  Motor Motor: Hemiplegia;Abnormal tone;Abnormal postural alignment and control Motor - Discharge Observations: Motor impersisitence with standing extension  Mobility Bed Mobility Bed Mobility: Rolling Right;Rolling Left;Supine to Sit;Sit to Supine Rolling Right: Moderate Assistance - Patient 50-74% Rolling Left: Moderate Assistance - Patient 50-74% Supine to Sit: Moderate Assistance - Patient 50-74% Sit to Supine: Moderate Assistance - Patient 50-74% Transfers Transfers: Sit to Stand;Stand to Sit;Squat Pivot Transfers Sit to Stand: Maximal Assistance - Patient 25-49% Stand to Sit: Maximal Assistance - Patient 25-49% Squat Pivot Transfers: Maximal Assistance - Patient 25-49% Transfer (Assistive device): None Locomotion  Gait Ambulation: No Gait Gait: No Stairs / Additional Locomotion Stairs: No Pick up small object  from the floor (from standing position) activity did not occur: Safety/medical concerns Wheelchair Mobility Wheelchair Mobility: Yes Wheelchair Assistance: Dependent - Patient 0% (pt unable to utilize UES 2/2 TIS w/c, unable to utilize LES 2/2 loaner w/c too high) Wheelchair Parts Management: Needs assistance  Trunk/Postural Assessment  Cervical Assessment Cervical Assessment: Exceptions to [facility] Thoracic Assessment Thoracic Assessment: Exceptions to [facility] Lumbar Assessment Lumbar Assessment: Exceptions to [facility] Postural Control Trunk Control: posterior bias, retropulsion; falling to the right with max cues and environmental cues to correct Righting Reactions: delayed & insufficient , significant flexed posture / forward head  Balance Balance Balance Assessed: Yes Static Sitting Balance Static Sitting - Balance Support: Feet supported;No upper extremity supported Static Sitting - Level of Assistance: 5: Stand by assistance Dynamic Sitting Balance Dynamic Sitting - Balance Support: Feet supported;No upper extremity supported Dynamic Sitting - Level of Assistance: 4: Min Insurance risk surveyor Standing - Balance Support: Bilateral upper extremity supported Static Standing - Level of Assistance: 2: Max assist Dynamic Standing Balance Dynamic Standing - Balance Support: Bilateral upper extremity supported Dynamic Standing - Level of Assistance: 3: Mod assist Extremity Assessment  RUE Assessment RUE Assessment: Within Functional Limits Active Range of Motion (AROM) Comments: WFL General Strength Comments: uncoordinated - when trying to bring right hand to mouth - missing mouth  3/5 LUE Assessment LUE Assessment: Exceptions to [facility] General Strength Comments: Able to extend and flex hand and bicep but with attention to it; at rest stays in syngestic pattern ( internally rotated and flexed at abdomen)- able to devate from synergy LUE Body System: Neuro Brunstrum levels for arm  and hand: Arm;Hand Brunstrum level for arm: Stage III Synergy is performed voluntarily RLE Assessment General Strength Comments: grossly 3-/5 RLE Strength RLE Overall Strength: Deficits;Due to premorbid status LLE Assessment LLE Assessment: Exceptions to [facility] General Strength Comments: grossly 2+/5, although increased fatigue. LLE Strength LLE Overall Strength: Deficits;Due  to premorbid status   Ladoris Gene 09/03/2022, 12:08 PM

## 2022-09-03 NOTE — Plan of Care (Signed)
  Problem: RH Swallowing Goal: LTG Patient will consume least restrictive diet using compensatory strategies with assistance (SLP) Description: LTG:  Patient will consume least restrictive diet using compensatory strategies with assistance (SLP) Outcome: Completed/Met   Problem: RH Expression Communication Goal: LTG Patient will increase speech intelligibility (SLP) Description: LTG: Patient will increase speech intelligibility at word/phrase/conversation level with cues, % of the time (SLP) Outcome: Completed/Met   Problem: RH Memory Goal: LTG Patient will demonstrate ability for day to day (SLP) Description: LTG:   Patient will demonstrate ability for day to day recall/carryover during cognitive/linguistic activities with assist  (SLP) Outcome: Completed/Met Goal: LTG Patient will use memory compensatory aids to (SLP) Description: LTG:  Patient will use memory compensatory aids to recall biographical/new, daily complex information with cues (SLP) Outcome: Completed/Met   Problem: RH Awareness Goal: LTG: Patient will demonstrate awareness during functional activites type of (SLP) Description: LTG: Patient will demonstrate awareness during functional activites type of (SLP) Outcome: Completed/Met

## 2022-09-03 NOTE — Progress Notes (Addendum)
Patient has episode of clear brown emesis, family at bedside;BP checked 191/67 patient asyptomatic, rechecked 48mns later 179//83 MAP 101 and HR 73;patient resting comfortably.

## 2022-09-03 NOTE — Progress Notes (Signed)
Speech Language Pathology Discharge Summary  Patient Details  Name: [name] MRN: [id number] Date of Birth: 1946-05-07  Date of Discharge from SLP service:September 03, 2022  Today's Date: 09/03/2022 SLP Individual Time: 1330-1457 SLP Individual Time Calculation: 27 mins    Skilled Therapeutic Interventions:  Skilled treatment session focused on communication goals. Upon arrival, patient was semi-reclined while laying on her left side and appeared lethargic. Patient independently recalled her speech intelligibility strategies with SLP facilitating session by providing overall Min verbal cues for use of speech intelligibility strategies at the sentence and conversation level during a mildly complex, structured language task. Patient's overall intelligibility appeared mildly reduced compared to yesterday's session which patient reports is due to fatigue and "not feeling as well." Patient left upright in bed with alarm on and all needs within reach. Continue with current plan of care.    Patient has met 4 of 4 long term goals.  Patient to discharge at [facility] level.   Reasons goals not met: N/A   Clinical Impression/Discharge Summary: Patient has made functional gains and has met 4 of 4 LTGs this admission. Currently, patient is consuming Dys. 1 textures with nectar-thick liquids via cup with minimal overt s/s of aspiration and overall Min A verbal cues for use of swallowing compensatory strategies. Patient demonstrates improved recall and utilization of speech intelligibility strategies resulting in ~80-90% intelligibility at the sentence level with supervision-Min A verbal cues to self-monitor and correct communication breakdowns. Patient also demonstrates improvement in recall of functional information with use of external aids and emergent awareness of errors resulting in requiring overall Mod A verbal cues during functional tasks. Patient and family education is complete and patient  will discharge home with 24 hour supervision. Patient would benefit from f/u SLP services to maximize her cognitive and swallowing function as well as her speech intelligibility in order to reduce caregiver burden.   Care Partner:  Caregiver Able to Provide Assistance: Yes  Type of Caregiver Assistance: Physical;Cognitive  Recommendation:  Home Health SLP;24 hour supervision/assistance  Rationale for SLP Follow Up: Maximize cognitive function and independence;Maximize swallowing safety;Maximize functional communication;Reduce caregiver burden   Equipment: Suction, thickener, EMST device   Reasons for discharge: Discharged from hospital;Treatment goals met   Patient/Family Agrees with Progress Made and Goals Achieved: Yes    [name] 09/03/2022, 6:34 AM

## 2022-09-03 NOTE — Progress Notes (Signed)
PROGRESS NOTE   Subjective/Complaints: Scopolamine patch helped a lot with secretions and she slept better last night Has no new complaints this morning OT notes improvements in past week  ROS: +lethargy as per daughter-improving + Dizziness  -resolved , denies pain, CP, SOB, abd pain, HA,  cough, right sided lean improved. +fatigue- improved, insomnia improved, +LUE spasticity, +right hand skin tear- stable, +right ear numbness-resolved, +excessive secretions   Objective:   No results found. Recent Labs    09/02/22 0950 09/03/22 0556  WBC 6.2 5.7  HGB 11.4* 11.4*  HCT 35.4* 34.8*  PLT 295 270      Recent Labs    09/02/22 0950 09/03/22 0556  NA 138  --   K 3.8  --   CL 107  --   CO2 21*  --   GLUCOSE 181*  --   BUN 43*  --   CREATININE 2.12* 2.10*  CALCIUM 8.5*  --       Intake/Output Summary (Last 24 hours) at 09/03/2022 1216 Last data filed at 09/03/2022 [id number] Gross per 24 hour  Intake 594 ml  Output --  Net 594 ml        Physical Exam: Vital Signs Blood pressure (!) 150/59, pulse (!) 58, temperature 98.2 F (36.8 C), temperature source Oral, resp. rate 16, height '5\' 2"'$  (1.575 m), weight 64.7 kg, SpO2 96 %. Constitutional:      Comments: Frail appearing; awake, alert and awake, but very little speech; Sitting up in wheelchair; daughter at bedside, NAD, BMI 26.09 HENT: No right ear deformities noted    Head: Normocephalic and atraumatic.  Negative HIT bilaterally.  No nystagmus on EOMI.  VOR intact. +excessive secretions    Comments: L facial droop- from prior stroke, but worsened later in day Tongue midline.     Right Ear: External ear normal.     Left Ear: External ear normal.     Nose: Nose normal. No congestion.     Mouth/Throat:     Mouth: Mucous membranes are moist    Pharynx: Oropharynx is clear. No oropharyngeal exudate.  Eyes:     General:        Right eye: No discharge.        Left  eye: No discharge.     Extraocular Movements: Extraocular movements intact.     Comments: Nystagmus to L>R  Cardiovascular:     Bradycardic    Heart sounds: Normal heart sounds. No murmur heard.    No gallop.  Pulmonary:     Effort: No respiratory distress.     Breath sounds: Normal breath sounds. No wheezing, rhonchi or rales. Good air movement. Abdominal:     General:normoactive BS. There is no distension.     Palpations: Abdomen is soft.     Tenderness: There is no abdominal tenderness.  Musculoskeletal:     Cervical back: Neck supple. No tenderness.     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  Speech is mildly dysarthric but intelligible.  Follows simple commands.    - unchanged  Psychiatric:     Comments: Flat, decreased interaction  GU: voiding on own! Total A for peri hygiene Skin: IV in place  Assessment/Plan: 1. Functional deficits which require 3+ hours per day of interdisciplinary therapy in a comprehensive inpatient rehab setting. Physiatrist is providing close team supervision and 24 hour management of active medical problems listed below. Physiatrist and rehab team continue to assess barriers to discharge/monitor patient progress toward functional and medical goals  Care Tool:  Bathing    Body parts bathed by patient: Left arm, Chest, Abdomen, Right upper leg, Left upper leg, Face   Body parts bathed by helper: Right arm, Front perineal area, Buttocks, Right lower leg, Left lower leg     Bathing assist Assist Level: Moderate Assistance - Patient 50 - 74%     Upper Body Dressing/Undressing Upper body dressing    What is the patient wearing?: Pull over shirt    Upper body assist Assist Level: Minimal Assistance - Patient > 75%    Lower Body Dressing/Undressing Lower body dressing      What is the patient wearing?: Underwear/pull up, Pants     Lower body assist Assist for lower body dressing: Maximal Assistance - Patient 25 - 49%     Toileting Toileting    Toileting assist Assist for toileting: Maximal Assistance - Patient 25 - 49%     Transfers Chair/bed transfer  Transfers assist     Chair/bed transfer assist level: Moderate Assistance - Patient 50 - 74%     Locomotion Ambulation   Ambulation assist   Ambulation activity did not occur: Safety/medical concerns  Assist level: 2 helpers Assistive device: Lite Gait Max distance: 10f   Walk 10 feet activity   Assist  Walk 10 feet activity did not occur: Safety/medical concerns  Assist level: 2 helpers Assistive device: Lite Gait   Walk 50 feet activity   Assist Walk 50 feet with 2 turns activity did not occur: Safety/medical concerns         Walk 150 feet activity   Assist Walk 150 feet activity did not occur: Safety/medical concerns         Walk 10 feet on uneven surface  activity   Assist Walk 10 feet on uneven surfaces activity did not occur: Safety/medical concerns         Wheelchair     Assist Is the patient using a wheelchair?: Yes Type of Wheelchair: Manual    Wheelchair assist level: Dependent - Patient 0% Max wheelchair distance:  (w/c too high for use of LES for propulsion.)    Wheelchair 50 feet with 2 turns activity    Assist        Assist Level: Dependent - Patient 0%   Wheelchair 150 feet activity     Assist      Assist Level: Dependent - Patient 0%   Blood pressure (!) 150/59, pulse (!) 58, temperature 98.2 F (36.8 C), temperature source Oral, resp. rate 16, height '5\' 2"'$  (1.575 m), weight 64.7 kg, SpO2 96 %.  Medical Problem List and Plan: 1.  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  Impaired mobility: daughter notes mom does not tolerate lovenox, d/ced, SCDs ordered.            Continue Aspirin 81 mg daily, plavix for 3 weeks.  3. Pain: N/A. Oxycodone discontinued. 4. Insomnia: resolved. Trazodone and zoloft d/ced 5. Neuropsych/cognition: This patient is? capable of making decisions on her own behalf. 6. Skin/Wound Care: Routine skin checks  -Protective cream to b/l heels 7. Fluids/Electrolytes/Nutrition: Routine in and outs with follow-up chemistries 8.  Dysphagia.  Downgraded to D1/nectar, discussed with SLP and daughter. Reviewed daughter's note that swallowing is improved. Discussed D1/nectar thick options for home, recommended smoothies as a nutritious option 9.  Hypertension.  Hydralazine d/ced due to emesis as per daughter's request. Losartan recently held due to AKI, but restarted at '25mg'$  BID. Additional '25mg'$  daily prn added for SBP >180. Daughter prefers slow reduction of BP- at baseline she runs [id number] systolic 10.  Acute urinary retention.  d/c flomax. Resolved.  11.  AKI on CKD stage III.  Baseline creatinine 1.9-2.0.  Cr reviewed from 2/13 and is better than baseline, Discussed with daughter that most recent creatinine on 2/20 continues to be better than baseline. Continue IVF over night- please renew nightly over the weekend -Order nightly at bedtime, 50 cc/h, renewed  12.  Diabetes mellitus.  Hemoglobin A1c 7.0, discussed with daughter.  CBGs being  monitor and reviewed, appear stable. Placed order for no juice. Provided dietary education.  Recent Labs    09/02/22 1643 09/03/22 0729 09/03/22 1157  GLUCAP 119* 98 132*    13.  History of right ankle fracture.  Patient does wear an ankle stabilizing brace due to previous ankle fx. . 14.  Hyperlipidemia.  Zetia/fenofibrate d/ced as per daughter's preference. Lipid panel repeated and reviewed with daughter.  15.  Constipation.  Miralax d/ced as per daughter's preference. Senokot 2 tabs at bedtime as needed.  Sorbitol caused side  16. 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 Kpad ordered. Discuss oral spasticity medications  - no complaints/discomfort 2/24-25  17. Suboptimal vitamin D: continue ergocalciferol 50,000U once per week for 7 weeks, discussed that this can help with fatigue 18. Anemia: repeat Hgb tomorrow. ->  2-24: Stable, 12.0 19. CKD: restarted fluids at night - ongoing  20. Fatigue: discussed amantadine and modafinil 21. HTN: decrease losartan to 29mdaily given AKI and improved SBP control, maintain prn.   -Elevated, has as needed for SBP greater than 180, monitor. Discussed improved overall control.     09/03/2022   10:32 AM 09/03/2022    6:09 AM 09/02/2022    8:04 PM  Vitals with BMI  Systolic [id number] Diastolic 59 62 58  Pulse 58 67 69     22. New acute right medullary infarct : continue aspirin/plavix 21 days from 2/13  23. Right ear numbness: discussed could be secondary to MRI machine noise, will order outpatient ENT eval for her  24. Right ear pain: resolved with Cortisporin drops .  25. Excessive secretions:  scopolamine patch ordered with good benefit, conitnue       LOS: 21 days A FACE TO FACE EVALUATION WAS PERFORMED  Martha Clan P Valorie Mcgrory 09/03/2022, 12:16 PM

## 2022-09-03 NOTE — Progress Notes (Signed)
Occupational Therapy Discharge Summary  Patient Details  Name: [name] MRN: [id number] Date of Birth: [birth year]  Date of Discharge from OT service:September 04, 2022  Today's Date: 09/03/2022 OT Individual Time: [id number] OT Individual Time Calculation (min): 53 min   Skilled therapeutic intervention:  Patient received seated in wheelchair, daughter in room.  Transported patient to ADL apartment to demonstrate tub/shower transfer with daughter.  Patient able to recall steps of task for transfer and explain in general to daughter.  Transferred patient with mod assist to/from tub transfer bench.  Patient with startle response with any fast or often with lateral movement.  Daughter very skilled in handling patient.  Demonstrates nice pace - moves slowly, waits for patient to respond, uses very good body mechanics, and patient clearly trusts her.  Daughter demonstrated sit to stand from wheelchair with mod/min assist.  Patient achieves full stand and lateral weight shifts!  Long discussion regarding recommendation for [facility] therapy prior to OP therapy to ensure daily living skills were safely managed in patient's environment.    Patient has met 5 of 10 long term goals due to improved activity tolerance and improved balance.  Patient to discharge at overall Mod Assist level.  Patient's care partner is independent to provide the necessary physical assistance at discharge.    Reasons goals not met: Patient has inconsistent physical performance based on fear, fatigue, and motor impersistence.  Recommendation:  Patient will benefit from ongoing skilled OT services in home health setting to continue to advance functional skills in the area of BADL.  Discussed quick transition to [place]: Tub transfer bench, specialty wheelchair  Reasons for discharge: discharge from hospital  Patient/family agrees with progress made and goals achieved: Yes  OT Discharge Precautions/Restrictions   Precautions Precautions: Fall Precaution Comments: premorbid L hemi Required Braces or Orthoses: Other Brace Other Brace: R ankle stabilizing brace from prior ankle fx Restrictions Weight Bearing Restrictions: Yes RLE Weight Bearing: Weight bearing as tolerated LLE Weight Bearing: Weight bearing as tolerated Other Position/Activity Restrictions: Old L sided weakness from a prior CVA from ~3 years ago. History of R abkle fracture ~1-1.5 yr ago  Pain Pain Assessment Pain Score: 0-No pain ADL ADL Eating: Contact guard Where Assessed-Eating: Chair Grooming: Minimal assistance Where Assessed-Grooming: Sitting at sink Upper Body Bathing: Moderate assistance Where Assessed-Upper Body Bathing: Sitting at sink, Shower Lower Body Bathing: Moderate assistance Where Assessed-Lower Body Bathing: Bed level Upper Body Dressing: Minimal assistance Where Assessed-Upper Body Dressing: Sitting at sink Lower Body Dressing: Moderate assistance Where Assessed-Lower Body Dressing: Edge of bed Toileting: Maximal assistance Where Assessed-Toileting: Bed level Toilet Transfer: Moderate assistance Toilet Transfer Method: Squat pivot Toilet Transfer Equipment: Drop arm bedside commode Tub/Shower Transfer: Moderate assistance Tub/Shower Transfer Method: Squat pivot Tub/Shower Equipment: Facilities manager: Unable to assess Social research officer, government Method: Unable to assess Vision Baseline Vision/History: 4 Cataracts;6 Macular Degeneration Patient Visual Report: No change from baseline Vision Assessment?: Vision impaired- to be further tested in functional context Perception  Perception: Impaired Inattention/Neglect: Does not attend to left side of body Spatial Orientation: fearful of weight shifts off midline Praxis Praxis: Impaired Praxis Impairment Details: Initiation;Motor planning Cognition Cognition Overall Cognitive Status: Impaired/Different from  baseline Arousal/Alertness: Awake/alert Orientation Level: Person;Place;Situation Person: Oriented Place: Oriented Situation: Oriented Memory: Impaired Memory Impairment: Retrieval deficit;Decreased recall of new information Attention: Selective Focused Attention: Appears intact Sustained Attention: Appears intact Selective Attention: Impaired Selective Attention Impairment: Functional basic Awareness: Impaired Awareness Impairment: Emergent impairment Problem  Solving: Appears intact Executive Function: Reasoning;Sequencing;Organizing;Decision Making;Initiating Reasoning: Impaired Reasoning Impairment: Functional basic Sequencing: Impaired Sequencing Impairment: Functional basic Organizing: Impaired Organizing Impairment: Functional basic Decision Making: Impaired Decision Making Impairment: Functional basic Initiating: Impaired Initiating Impairment: Functional basic Behaviors: Poor frustration tolerance Safety/Judgment: Impaired Brief Interview for Mental Status (BIMS) Repetition of Three Words (First Attempt): 3 Temporal Orientation: Year: Correct Temporal Orientation: Month: Accurate within 5 days Temporal Orientation: Day: Correct Recall: "Sock": Yes, no cue required Recall: "Blue": Yes, no cue required Recall: "Bed": Yes, no cue required BIMS Summary Score: 15 Sensation Sensation Light Touch: Appears Intact Hot/Cold: Appears Intact Proprioception: Impaired by gross assessment Proprioception Impaired Details: Impaired LLE;Impaired RLE Stereognosis: Not tested Coordination Gross Motor Movements are Fluid and Coordinated: No Fine Motor Movements are Fluid and Coordinated: No Coordination and Movement Description: L hemi (premorbid). UE > LE Motor  Motor Motor: Hemiplegia;Abnormal tone;Motor apraxia;Abnormal postural alignment and control;Motor impersistence Motor - Discharge Observations: Motor impersisitence with standing extension Mobility  Bed Mobility Bed  Mobility: Rolling Right;Rolling Left;Supine to Sit;Sit to Supine Rolling Right: Moderate Assistance - Patient 50-74% Rolling Left: Moderate Assistance - Patient 50-74% Supine to Sit: Moderate Assistance - Patient 50-74% Sit to Supine: Moderate Assistance - Patient 50-74% Transfers Sit to Stand: Moderate Assistance - Patient 50-74% Stand to Sit: Moderate Assistance - Patient 50-74%  Trunk/Postural Assessment  Cervical Assessment Cervical Assessment: Exceptions to [facility] Thoracic Assessment Thoracic Assessment: Exceptions to [facility] Lumbar Assessment Lumbar Assessment: Exceptions to [facility] Postural Control Trunk Control: posterior bias, retropulsion; falling to the right with max cues and environmental cues to correct Righting Reactions: delayed & insufficient , significant flexed posture / forward head  Balance Balance Balance Assessed: Yes Static Sitting Balance Static Sitting - Balance Support: Feet supported;No upper extremity supported Static Sitting - Level of Assistance: 5: Stand by assistance Dynamic Sitting Balance Dynamic Sitting - Balance Support: Feet supported;No upper extremity supported Dynamic Sitting - Level of Assistance: 4: Min Insurance risk surveyor Standing - Balance Support: Bilateral upper extremity supported Static Standing - Level of Assistance: 3: Mod assist Dynamic Standing Balance Dynamic Standing - Balance Support: Bilateral upper extremity supported Dynamic Standing - Level of Assistance: 3: Mod assist Extremity/Trunk Assessment RUE Assessment RUE Assessment: Within Functional Limits Active Range of Motion (AROM) Comments: WFL General Strength Comments: uncoordinated - when trying to bring right hand to mouth - missing mouth  3/5 LUE Assessment LUE Assessment: Exceptions to [facility] General Strength Comments: Able to extend and flex hand and bicep but with attention to it; at rest stays in syngestic pattern ( internally rotated and flexed at  abdomen)- able to devate from synergy LUE Body System: Neuro Brunstrum levels for arm and hand: Arm;Hand Brunstrum level for arm: Stage III Synergy is performed voluntarily   Mariah Milling 09/03/2022, 9:47 AM

## 2022-09-03 NOTE — Progress Notes (Signed)
Inpatient Rehabilitation Care Coordinator Discharge Note   Patient Details  Name: [name] MRN: [id number] Date of Birth: 02-Feb-1946   Discharge location: HOME WITH DAUGHTER WHO HAS BEEN PROVIDING CARE PRIOR TO ADMISSION  Length of Stay: 15 DAYS  Discharge activity level: MIN-MOD WHEELCHAIR LEVEL  Home/community participation: ACTIVE  Patient response [id number] Literacy - How often do you need to have someone help you when you read instructions, pamphlets, or other written material from your doctor or pharmacy?: Often  Patient response [id number] Isolation - How often do you feel lonely or isolated from those around you?: Never  Services provided included: MD, RD, PT, OT, SLP, RN, CM, TR, Pharmacy, SW  Financial Services:  Charity fundraiser Utilized: Environmental education officer MEDICARE  Choices offered to/list presented to: PT AND DAUGHTER  Follow-up services arranged:  Home Health, DME, Patient/Family request agency HH/DME [place]: [facility]-PT OT SP RN    DME : [facility]-SUCTION MACHINE AND TUB BENCH  HAS ALL OTHER EQUIPMENT FROM PAST ADMISSIONS HH/DME Requested Agency: MD OFFICE MADE REFERRAL TO [place]  Patient response to transportation need: Is the patient able to respond to transportation needs?: Yes In the past 12 months, has lack of transportation kept you from medical appointments or from getting medications?: No In the past 12 months, has lack of transportation kept you from meetings, work, or from getting things needed for daily living?: No    Comments (or additional information):DAUGHTER WAS HERE DAILY AND PARTICIPATED IN THERAPIES WITH HER MOM AND ASSISTED WITH HER CARE WHILE HERE. THE HOPE IS TO GET HER BACK TO OP AND BACK TO POOL THERAPY WHICH PT LOVED.   Patient/Family verbalized understanding of follow-up arrangements:  Yes  Individual responsible for coordination of the follow-up plan: [name]-DAUGHTER   [phone]  Confirmed correct DME delivered: [name] 09/03/2022    Charmon Thorson, Gardiner Rhyme

## 2022-09-03 NOTE — Progress Notes (Signed)
Inpatient Rehabilitation Discharge Medication Review by a Pharmacist  A complete drug regimen review was completed for this patient to identify any potential clinically significant medication issues.  High Risk Drug Classes Is patient taking? Indication by Medication  Antipsychotic No   Anticoagulant No   Antibiotic No   Opioid No   Antiplatelet Yes ASA/Plavix- CVA ppx (Plavix stop after 09/12/2022)  Hypoglycemics/insulin No   Vasoactive Medication Yes Losartan- HTN  Chemotherapy No   Other Yes Melatonin- sleep Scopolamine- dizziness/excessive secretions Ergocalciferol- supplementation 2/2 history of deficiency     Type of Medication Issue Identified Description of Issue Recommendation(s)  Drug Interaction(s) (clinically significant)     Duplicate Therapy     Allergy     No Medication Administration End Date     Incorrect Dose     Additional Drug Therapy Needed     Significant med changes from prior encounter (inform family/care partners about these prior to discharge).    Other  PTA meds: Zetia Fenofibrate Hydralazine Daughter preference to DC zetia/fenofibrate. Lipid panel reviewed (LDL 121 / HDL 33 / VLDL 27 / TC 181 / TG 135) Hydralazine- Dc'd 2/2 daughter preference for slower reduction in BP. Stated patient runs in the Q000111Q systolic at BL    Clinically significant medication issues were identified that warrant physician communication and completion of prescribed/recommended actions by midnight of the next day:  No   Time spent performing this drug regimen review (minutes):  30   Elisabeth Strom BS, PharmD, BCPS Clinical Pharmacist 09/03/2022 7:41 AM  Contact: [phone] after 3 PM  "Be curious, not judgmental..." -Jamal Maes

## 2022-09-04 ENCOUNTER — Other Ambulatory Visit (HOSPITAL_COMMUNITY): Payer: Self-pay

## 2022-09-04 DIAGNOSIS — R252 Cramp and spasm: Secondary | ICD-10-CM

## 2022-09-04 DIAGNOSIS — I69398 Other sequelae of cerebral infarction: Secondary | ICD-10-CM

## 2022-09-04 LAB — BASIC METABOLIC PANEL
Anion gap: 11 (ref 5–15)
BUN: 36 mg/dL — ABNORMAL HIGH (ref 8–23)
CO2: 19 mmol/L — ABNORMAL LOW (ref 22–32)
Calcium: 8.4 mg/dL — ABNORMAL LOW (ref 8.9–10.3)
Chloride: 109 mmol/L (ref 98–111)
Creatinine, Ser: 2.01 mg/dL — ABNORMAL HIGH (ref 0.44–1.00)
GFR, Estimated: 25 mL/min — ABNORMAL LOW (ref >60)
Glucose, Bld: 113 mg/dL — ABNORMAL HIGH (ref 70–99)
Potassium: 4.1 mmol/L (ref 3.5–5.1)
Sodium: 139 mmol/L (ref 135–145)

## 2022-09-04 LAB — GLUCOSE, CAPILLARY: Glucose-Capillary: 109 mg/dL — ABNORMAL HIGH (ref 70–99)

## 2022-09-04 MED ORDER — AZITHROMYCIN 500 MG PO TABS
500.0000 mg | ORAL_TABLET | Freq: Every day | ORAL | Status: AC
  Administered 2022-09-04: 500 mg via ORAL
  Filled 2022-09-04: qty 1

## 2022-09-04 MED ORDER — AZITHROMYCIN 250 MG PO TABS
ORAL_TABLET | ORAL | 0 refills | Status: DC
  Filled 2022-09-04: qty 4, 4d supply, fill #0

## 2022-09-04 MED ORDER — AZITHROMYCIN 250 MG PO TABS: 250.0000 mg | ORAL_TABLET | Freq: Every day | ORAL | Status: DC

## 2022-09-04 NOTE — Progress Notes (Signed)
Per Dr. Michaelene Song to given morning dose of Losartan. Patient to take at home @ HS. Patient and daughter verbalized understanding. PA ([place]) Provided discharge instructions and reviewed medication. Medications provided by  [facility]. Staff assisted patient off the unit into private car, patient discharged from CIR safely.  Dayshawn Irizarry Cariana Karge,LPN

## 2022-09-04 NOTE — Progress Notes (Signed)
PROGRESS NOTE   Subjective/Complaints: Daughter has noted emerging sinus infection- zpac ordered BP better controlled Cr improved  ROS: +lethargy as per daughter-improving + Dizziness  -resolved , denies pain, CP, SOB, abd pain, HA,  cough, right sided lean improved. +fatigue- improved, insomnia improved, +LUE spasticity, +right hand skin tear- stable, +right ear numbness-resolved, +excessive secretions- improved   Objective:   No results found. Recent Labs    09/02/22 0950 09/03/22 0556  WBC 6.2 5.7  HGB 11.4* 11.4*  HCT 35.4* 34.8*  PLT 295 270      Recent Labs    09/02/22 0950 09/03/22 0556 09/04/22 0646  NA 138  --  139  K 3.8  --  4.1  CL 107  --  109  CO2 21*  --  19*  GLUCOSE 181*  --  113*  BUN 43*  --  36*  CREATININE 2.12* 2.10* 2.01*  CALCIUM 8.5*  --  8.4*      Intake/Output Summary (Last 24 hours) at 09/04/2022 0936 Last data filed at 09/04/2022 0751 Gross per 24 hour  Intake 1410 ml  Output 100 ml  Net 1310 ml        Physical Exam: Vital Signs Blood pressure (!) 159/59, pulse 70, temperature 97.9 F (36.6 C), resp. rate 15, height '5\' 2"'$  (1.575 m), weight 64.7 kg, SpO2 99 %. Constitutional:      Comments: Frail appearing; awake, alert and awake, but very little speech; Sitting up in wheelchair; daughter at bedside, NAD, BMI 26.09 HENT: No right ear deformities noted    Head: Normocephalic and atraumatic.  Negative HIT bilaterally.  No nystagmus on EOMI.  VOR intact. +excessive secretions    Comments: L facial droop- from prior stroke, but worsened later in day Tongue midline.     Right Ear: External ear normal.     Left Ear: External ear normal.     Nose: Nose normal. No congestion.     Mouth/Throat:     Mouth: Mucous membranes are moist    Pharynx: Oropharynx is clear. No oropharyngeal exudate.  Eyes:     General:        Right eye: No discharge.        Left eye: No discharge.      Extraocular Movements: Extraocular movements intact.     Comments: Nystagmus to L>R  Cardiovascular:     Bradycardic    Heart sounds: Normal heart sounds. No murmur heard.    No gallop.  Pulmonary:     Effort: No respiratory distress.     Breath sounds: Normal breath sounds. No wheezing, rhonchi or rales. Good air movement. Abdominal:     General:normoactive BS. There is no distension.     Palpations: Abdomen is soft.     Tenderness: There is no abdominal tenderness.  Musculoskeletal:     Cervical back: Neck supple. No tenderness.     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  Speech is mildly dysarthric but intelligible.  Follows simple commands.    - unchanged  Psychiatric:     Comments: Flat, decreased interaction  GU: voiding on own! Total A for peri hygiene Skin: IV in place, onchomycosis of left thumbnail  Assessment/Plan: 1. Functional deficits which require 3+ hours per day of interdisciplinary therapy in a comprehensive inpatient rehab setting. Physiatrist is providing close team supervision and 24 hour management of active medical problems listed below. Physiatrist and rehab team continue to assess barriers to discharge/monitor patient progress toward functional and medical goals  Care Tool:  Bathing    Body parts bathed by patient: Left arm, Chest, Abdomen, Right upper leg, Left upper leg, Face   Body parts bathed by helper: Right arm, Front perineal area, Buttocks, Right lower leg, Left lower leg     Bathing assist Assist Level: Moderate Assistance - Patient 50 - 74%     Upper Body Dressing/Undressing Upper  body dressing   What is the patient wearing?: Pull over shirt    Upper body assist Assist Level: Minimal Assistance - Patient > 75%    Lower Body Dressing/Undressing Lower body dressing      What is the patient wearing?: Underwear/pull up, Pants     Lower body assist Assist for lower body dressing: Maximal Assistance - Patient 25 - 49%     Toileting Toileting    Toileting assist Assist for toileting: Maximal Assistance - Patient 25 - 49%     Transfers Chair/bed transfer  Transfers assist     Chair/bed transfer assist level: Moderate Assistance - Patient 50 - 74%     Locomotion Ambulation   Ambulation assist   Ambulation activity did not occur: Safety/medical concerns  Assist level: 2 helpers Assistive device: Lite Gait Max distance: 58f   Walk 10 feet activity   Assist  Walk 10 feet activity did not occur: Safety/medical concerns  Assist level: 2 helpers Assistive device: Lite Gait   Walk 50 feet activity   Assist Walk 50 feet with 2 turns activity did not occur: Safety/medical concerns         Walk 150 feet activity   Assist Walk 150 feet activity did not occur: Safety/medical concerns         Walk 10 feet on uneven surface  activity   Assist Walk 10 feet on uneven surfaces activity did not occur: Safety/medical concerns         Wheelchair     Assist Is the patient using a wheelchair?: Yes Type of Wheelchair: Manual    Wheelchair assist level: Dependent - Patient 0% Max wheelchair distance:  (w/c too high for use of LES for propulsion.)    Wheelchair 50 feet with 2 turns activity    Assist        Assist Level: Dependent - Patient 0%   Wheelchair 150 feet activity     Assist      Assist Level: Dependent - Patient 0%   Blood pressure (!) 159/59, pulse 70, temperature 97.9 F (36.6 C), resp. rate 15, height '5\' 2"'$  (1.575 m), weight 64.7 kg, SpO2 99 %.  Medical Problem List and Plan: 1. 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  Impaired mobility: daughter notes mom does not tolerate lovenox, d/ced, SCDs ordered.            Continue Aspirin 81 mg daily, plavix for 3 weeks.  3. Pain: N/A. Oxycodone discontinued. 4. Insomnia: resolved. Trazodone and zoloft d/ced 5. Neuropsych/cognition: This patient is? capable of making decisions on her own behalf. 6. Skin/Wound Care: Routine skin checks  -Protective cream to b/l heels 7. Fluids/Electrolytes/Nutrition: Routine in and outs with follow-up chemistries 8.  Dysphagia.  Downgraded to D1/nectar, discussed with SLP and daughter. Reviewed daughter's note that swallowing is improved. Discussed D1/nectar thick options for home, recommended smoothies as a nutritious option 9.  Hypertension.  Hydralazine d/ced due to emesis as per daughter's request. Losartan recently held due to AKI, but restarted at '25mg'$  BID. Additional '25mg'$  daily prn added for SBP >180. Daughter prefers slow reduction of BP- at baseline she runs [id number] systolic 10.  Acute urinary retention.  d/c flomax. Resolved.  11.  AKI on CKD stage III.  Baseline creatinine 1.9-2.0.  Cr reviewed from 2/13 and is better than baseline, Discussed with daughter that most recent creatinine on 2/20 continues to be better than baseline. Continue IVF over night- please renew nightly over the weekend -Order nightly at bedtime, 50 cc/h, renewed  12.  Diabetes mellitus.  Hemoglobin A1c 7.0, discussed with daughter.  CBGs being monitor and reviewed, appear  stable. Placed order for no juice. Provided dietary education.  Recent Labs    09/03/22 1157 09/03/22 1624 09/04/22 0707  GLUCAP 132* 174* 109*    13.  History of right ankle fracture.  Patient does wear an ankle stabilizing brace due to previous ankle fx. . 14.  Hyperlipidemia.  Zetia/fenofibrate d/ced as per daughter's preference. Lipid panel repeated and reviewed with daughter.  15.  Constipation.  Miralax d/ced as per daughter's preference. Senokot 2 tabs at bedtime as needed.  Sorbitol caused side  16. 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 Kpad ordered. Discuss oral spasticity medications  - no complaints/discomfort 2/24-25  17. Suboptimal vitamin D: continue ergocalciferol 50,000U once per week for 7 weeks, discussed that this can help with fatigue 18. Anemia: repeat Hgb tomorrow. ->  2-24: Stable, 12.0 19. CKD: restarted fluids at night - ongoing  20. Fatigue: discussed amantadine and modafinil 21. HTN: decrease losartan to 44mdaily given AKI and improved SBP control, maintain prn.   -Elevated, has as needed for SBP greater than 180, monitor. Discussed improved overall control.     09/04/2022    9:00 AM 09/04/2022    4:11 AM 09/03/2022    8:12 PM  Vitals with BMI  Systolic [id number] Diastolic 59 60 62  Pulse 70 67 60     22. New acute right medullary infarct : continue aspirin/plavix 21 days from 2/13, discussed with daughter  232 Right ear numbness: discussed could be secondary to MRI machine noise, will order outpatient ENT eval for her  24. Right ear pain: resolved with Cortisporin drops .  25.  Excessive secretions: scopolamine patch ordered with good benefit, continue  26. Sinus infection: Z-pak ordered   >30 minutes spent in discharge of patient including review of medications and follow-up appointments, physical examination, and in answering all  patient's questions       LOS: 22 days A FACE TO FACE EVALUATION WAS [place] 09/04/2022, 9:36 AM

## 2022-09-05 ENCOUNTER — Encounter: Payer: Medicare PPO | Admitting: Nurse Practitioner

## 2022-09-10 ENCOUNTER — Encounter: Payer: Self-pay | Admitting: Nurse Practitioner

## 2022-09-10 DIAGNOSIS — I69354 Hemiplegia and hemiparesis following cerebral infarction affecting left non-dominant side: Secondary | ICD-10-CM

## 2022-09-11 ENCOUNTER — Encounter: Payer: Self-pay | Admitting: Physical Medicine and Rehabilitation

## 2022-09-13 ENCOUNTER — Telehealth: Payer: Self-pay

## 2022-09-13 NOTE — Telephone Encounter (Signed)
Rx mailed for Meals for Moms to Ms [facility].

## 2022-09-13 NOTE — Telephone Encounter (Signed)
Yes I can follow

## 2022-09-13 NOTE — Telephone Encounter (Signed)
[name] with [facility] called wanting to know if you would be following patient's orders for PT,OT and speech since you have not seen patient in a while or would you want to wait until after her appointment on 3/15.  Message routed to Sherrie Mustache, NP

## 2022-09-16 DIAGNOSIS — I69354 Hemiplegia and hemiparesis following cerebral infarction affecting left non-dominant side: Secondary | ICD-10-CM | POA: Diagnosis not present

## 2022-09-16 DIAGNOSIS — I69391 Dysphagia following cerebral infarction: Secondary | ICD-10-CM | POA: Diagnosis not present

## 2022-09-16 DIAGNOSIS — E1122 Type 2 diabetes mellitus with diabetic chronic kidney disease: Secondary | ICD-10-CM | POA: Diagnosis not present

## 2022-09-16 DIAGNOSIS — I129 Hypertensive chronic kidney disease with stage 1 through stage 4 chronic kidney disease, or unspecified chronic kidney disease: Secondary | ICD-10-CM | POA: Diagnosis not present

## 2022-09-16 DIAGNOSIS — M1990 Unspecified osteoarthritis, unspecified site: Secondary | ICD-10-CM | POA: Diagnosis not present

## 2022-09-16 DIAGNOSIS — N184 Chronic kidney disease, stage 4 (severe): Secondary | ICD-10-CM | POA: Diagnosis not present

## 2022-09-16 DIAGNOSIS — Z87440 Personal history of urinary (tract) infections: Secondary | ICD-10-CM | POA: Diagnosis not present

## 2022-09-16 DIAGNOSIS — E785 Hyperlipidemia, unspecified: Secondary | ICD-10-CM | POA: Diagnosis not present

## 2022-09-16 DIAGNOSIS — K5900 Constipation, unspecified: Secondary | ICD-10-CM | POA: Diagnosis not present

## 2022-09-16 NOTE — Telephone Encounter (Signed)
Spoke with Hoyle Sauer, she verbalized he understanding and agreed.

## 2022-09-17 ENCOUNTER — Telehealth: Payer: Self-pay

## 2022-09-17 DIAGNOSIS — I129 Hypertensive chronic kidney disease with stage 1 through stage 4 chronic kidney disease, or unspecified chronic kidney disease: Secondary | ICD-10-CM | POA: Diagnosis not present

## 2022-09-17 DIAGNOSIS — N184 Chronic kidney disease, stage 4 (severe): Secondary | ICD-10-CM | POA: Diagnosis not present

## 2022-09-17 DIAGNOSIS — E785 Hyperlipidemia, unspecified: Secondary | ICD-10-CM | POA: Diagnosis not present

## 2022-09-17 DIAGNOSIS — I69391 Dysphagia following cerebral infarction: Secondary | ICD-10-CM | POA: Diagnosis not present

## 2022-09-17 DIAGNOSIS — M1990 Unspecified osteoarthritis, unspecified site: Secondary | ICD-10-CM | POA: Diagnosis not present

## 2022-09-17 DIAGNOSIS — E1122 Type 2 diabetes mellitus with diabetic chronic kidney disease: Secondary | ICD-10-CM | POA: Diagnosis not present

## 2022-09-17 DIAGNOSIS — I69354 Hemiplegia and hemiparesis following cerebral infarction affecting left non-dominant side: Secondary | ICD-10-CM | POA: Diagnosis not present

## 2022-09-17 DIAGNOSIS — Z87440 Personal history of urinary (tract) infections: Secondary | ICD-10-CM | POA: Diagnosis not present

## 2022-09-17 DIAGNOSIS — K5900 Constipation, unspecified: Secondary | ICD-10-CM | POA: Diagnosis not present

## 2022-09-17 NOTE — Telephone Encounter (Signed)
Sonia Baller from [facility]. Calling for verbal orders home health speech and swallowing therapy. Requesting therapy for one time a week for  one week. Verbal orders given.

## 2022-09-17 NOTE — Telephone Encounter (Signed)
Tish with [facility] called requesting verbal orders for OT and speech therapy for 1 time a week for one week (this Week) for eval.  Verbal orders were given

## 2022-09-18 DIAGNOSIS — M5459 Other low back pain: Secondary | ICD-10-CM | POA: Diagnosis not present

## 2022-09-18 DIAGNOSIS — R293 Abnormal posture: Secondary | ICD-10-CM | POA: Diagnosis not present

## 2022-09-18 DIAGNOSIS — M9903 Segmental and somatic dysfunction of lumbar region: Secondary | ICD-10-CM | POA: Diagnosis not present

## 2022-09-18 DIAGNOSIS — M9901 Segmental and somatic dysfunction of cervical region: Secondary | ICD-10-CM | POA: Diagnosis not present

## 2022-09-18 DIAGNOSIS — M9902 Segmental and somatic dysfunction of thoracic region: Secondary | ICD-10-CM | POA: Diagnosis not present

## 2022-09-19 ENCOUNTER — Encounter: Payer: Self-pay | Admitting: Nurse Practitioner

## 2022-09-19 DIAGNOSIS — E785 Hyperlipidemia, unspecified: Secondary | ICD-10-CM | POA: Diagnosis not present

## 2022-09-19 DIAGNOSIS — I69391 Dysphagia following cerebral infarction: Secondary | ICD-10-CM | POA: Diagnosis not present

## 2022-09-19 DIAGNOSIS — Z87440 Personal history of urinary (tract) infections: Secondary | ICD-10-CM | POA: Diagnosis not present

## 2022-09-19 DIAGNOSIS — I69354 Hemiplegia and hemiparesis following cerebral infarction affecting left non-dominant side: Secondary | ICD-10-CM | POA: Diagnosis not present

## 2022-09-19 DIAGNOSIS — M1990 Unspecified osteoarthritis, unspecified site: Secondary | ICD-10-CM | POA: Diagnosis not present

## 2022-09-19 DIAGNOSIS — E1122 Type 2 diabetes mellitus with diabetic chronic kidney disease: Secondary | ICD-10-CM | POA: Diagnosis not present

## 2022-09-19 DIAGNOSIS — I129 Hypertensive chronic kidney disease with stage 1 through stage 4 chronic kidney disease, or unspecified chronic kidney disease: Secondary | ICD-10-CM | POA: Diagnosis not present

## 2022-09-19 DIAGNOSIS — N184 Chronic kidney disease, stage 4 (severe): Secondary | ICD-10-CM | POA: Diagnosis not present

## 2022-09-19 DIAGNOSIS — K5900 Constipation, unspecified: Secondary | ICD-10-CM | POA: Diagnosis not present

## 2022-09-19 NOTE — Progress Notes (Signed)
   This service is provided via telemedicine  No vital signs collected/recorded due to the encounter was a telemedicine visit.   Location of patient (ex: home, work):  Home  Patient consents to a telephone visit: Yes, see telephone visit dated 09/20/2022  Location of the provider (ex: office, home):  [facility], Office   Name of any referring provider:  N/A  Names of all persons participating in the telemedicine service and their role in the encounter:  S.Chrae B/CMA, Sherrie Mustache, NP, Daughter, and Patient   Time spent on call:  16 min with medical assistant

## 2022-09-20 ENCOUNTER — Encounter: Payer: Self-pay | Admitting: Nurse Practitioner

## 2022-09-20 ENCOUNTER — Ambulatory Visit (INDEPENDENT_AMBULATORY_CARE_PROVIDER_SITE_OTHER): Payer: Medicare PPO | Admitting: Nurse Practitioner

## 2022-09-20 DIAGNOSIS — Z0000 Encounter for general adult medical examination without abnormal findings: Secondary | ICD-10-CM | POA: Diagnosis not present

## 2022-09-20 DIAGNOSIS — Z66 Do not resuscitate: Secondary | ICD-10-CM | POA: Diagnosis not present

## 2022-09-20 NOTE — Progress Notes (Signed)
Subjective:   [name] is a 77 y.o. female who presents for Medicare Annual (Subsequent) preventive examination.  Review of Systems     Cardiac Risk Factors include: advanced age (>59men, >65 women);diabetes mellitus;sedentary lifestyle;dyslipidemia     Objective:    There were no vitals filed for this visit. There is no height or weight on file to calculate BMI.     09/20/2022    8:35 AM 09/19/2022   10:58 AM 08/13/2022    2:00 PM 08/07/2022    5:00 PM 07/30/2022    2:21 PM 03/13/2022    8:48 AM 09/07/2021    8:05 AM  Advanced Directives  Does Patient Have a Medical Advance Directive? Yes Yes No No No Yes Yes  Type of Advance Directive Out of facility DNR (pink MOST or yellow form) Out of facility DNR (pink MOST or yellow form)    Healthcare Power of [place];Living will;Out of facility DNR (pink MOST or yellow form)  Does patient want to make changes to medical advance directive? No - Patient declined No - Patient declined     No - Patient declined  Copy of [place] in Chart?      Yes - validated most recent copy scanned in chart (See row information) No - copy requested  Would patient like information on creating a medical advance directive?   No - Patient declined No - Patient declined No - Patient declined    Pre-existing out of facility DNR order (yellow form or pink MOST form) Yellow form placed in chart (order not valid for inpatient use);Pink MOST form placed in chart (order not valid for inpatient use) Yellow form placed in chart (order not valid for inpatient use);Pink MOST form placed in chart (order not valid for inpatient use)         Current Medications (verified) Outpatient Encounter Medications as of 09/20/2022  Medication Sig   acetaminophen (TYLENOL) 325 MG tablet Take 2 tablets (650 mg total) by mouth every 4 (four) hours as needed for mild pain (or temp > 37.5 C (99.5 F)).   aspirin 81 MG chewable tablet Chew 1  tablet (81 mg total) by mouth at bedtime.   BLACK CURRANT SEED OIL PO Take 1 tablet by mouth daily.   losartan (COZAAR) 25 MG tablet Take 1 tablet (25 mg total) by mouth daily.   melatonin 3 MG TABS tablet Take 1 tablet (3 mg total) by mouth at bedtime as needed.   sennosides-docusate sodium (SENOKOT-S) 8.6-50 MG tablet Take 1 tablet by mouth as needed for constipation.   Vitamin D, Ergocalciferol, (DRISDOL) 1.25 MG (50000 UNIT) CAPS capsule Take 1 capsule (50,000 Units total) by mouth every Wednesday.   [DISCONTINUED] azithromycin (ZITHROMAX) 250 MG tablet Take 1 tablet (250mg ) by mouth daily for 4 days   [DISCONTINUED] scopolamine (TRANSDERM-SCOP) 1 MG/3DAYS Place 1 patch (1.5 mg total) onto the skin every 3 (three) days.   [DISCONTINUED] senna-docusate (SENOKOT-S) 8.6-50 MG tablet Take 1 tablet by mouth at bedtime.   No facility-administered encounter medications on file as of 09/20/2022.    Allergies (verified) Cephalexin, Clonidine, Norvasc [amlodipine besylate], Lactose intolerance (gi), Latex, Lisinopril, Morphine and related, Tape, Amlodipine, Codeine, and Crestor [rosuvastatin]   History: Past Medical History:  Diagnosis Date   Allergy    Arthritis    Broken ankle    Cataract    Diabetes mellitus without complication ([place])    History of colonoscopy    Hypertension  Macular degeneration syndrome    with edema---being treated.    Stage 4 chronic kidney disease ([place])    Stroke ([place]) 2019   Past Surgical History:  Procedure Laterality Date   callous removal  Left 2017   located on 1st digit on L foot 2/2 diabetes   EYE SURGERY     Family History  Problem Relation Age of Onset   Kidney disease Mother    Congestive Heart Failure Father    COPD Father    COPD Brother    Diabetes Maternal Aunt    Social History   Socioeconomic History   Marital status: Legally Separated    Spouse name: Rennis Harding   Number of children: 2   Years of education: Highschool and 1  year of collwege in business    Highest education level: High school graduate  Occupational History   Not on file  Tobacco Use   Smoking status: Never   Smokeless tobacco: Never  Vaping Use   Vaping Use: Never used  Substance and Sexual Activity   Alcohol use: No   Drug use: Never   Sexual activity: Not Currently  Other Topics Concern   Not on file  Social History Narrative   Tobacco use, amount per day now: None/Never Smoked   Past tobacco use, amount per day:   How many years did you use tobacco:   Alcohol use (drinks per week): Never    Diet: Carb modified.   Do you drink/eat things with caffeine: yes sometimes.   Marital status:   Single                               What year were you married?   Do you live in a house, apartment, assisted living, condo, trailer, etc.? House   Is it one or more stories? One   How many persons live in your home? Daughter   Do you have pets in your home?( please list) None.   Highest Level of education completed? Associates Degree   Current or past profession: [employer]    Do you exercise?   No                               Type and how often?   Do you have a living will? Yes   Do you have a DNR form?     No                              If not, do you want to discuss one?   Do you have signed POA/HPOA forms?  Yes                   If so, please bring to you appointment      Do you have any difficulty bathing or dressing yourself? Yes   Do you have any difficulty preparing food or eating? No   Do you have any difficulty managing your medications? No   Do you have any difficulty managing your finances? No   Do you have any difficulty affording your medications? No   Social Determinants of Health   Financial Resource Strain: Medium Risk (02/27/2018)   Overall Financial Resource Strain (CARDIA)    Difficulty of Paying Living Expenses: Somewhat hard  Food Insecurity: No Food  Insecurity (08/07/2022)   Hunger Vital Sign    Worried  About Running Out of Food in the Last Year: Never true    Ran Out of Food in the Last Year: Never true  Transportation Needs: No Transportation Needs (08/07/2022)   PRAPARE - Hydrologist (Medical): No    Lack of Transportation (Non-Medical): No  Physical Activity: Inactive (02/27/2018)   Exercise Vital Sign    Days of Exercise per Week: 0 days    Minutes of Exercise per Session: 0 min  Stress: No Stress Concern Present (02/27/2018)   [place]    Feeling of Stress : Not at all  Social Connections: Somewhat Isolated (02/27/2018)   Social Connection and Isolation Panel [NHANES]    Frequency of Communication with Friends and Family: More than three times a week    Frequency of Social Gatherings with Friends and Family: Twice a week    Attends Religious Services: Never    Marine scientist or Organizations: No    Attends Music therapist: Never    Marital Status: Married    Tobacco Counseling Counseling given: Not Answered   Clinical Intake:  Pre-visit preparation completed: Yes  Pain : No/denies pain        How often do you need to have someone help you when you read instructions, pamphlets, or other written materials from your doctor or pharmacy?: 1 - Never  Diabetic? yes         Activities of Daily Living    09/20/2022    8:52 AM 08/15/2022   10:00 AM  In your present state of health, do you have any difficulty performing the following activities:  Hearing? 0   Vision? 0   Difficulty concentrating or making decisions? 0   Walking or climbing stairs? 1   Dressing or bathing? 1   Doing errands, shopping? 1 0  Comment daughter helps   Preparing Food and eating ? Y   Using the Toilet? Y   In the past six months, have you accidently leaked urine? Y   Do you have problems with loss of bowel control? Y   Managing your Medications? Y   Managing your  Finances? Y   Housekeeping or managing your Housekeeping? Y     Patient Care Team: Lauree Chandler, NP as PCP - General (Geriatric Medicine) Sherlynn Stalls, MD as Consulting Physician (Ophthalmology)  Indicate any recent Medical Services you may have received from other than [facility] providers in the past year (date may be approximate).     Assessment:   This is a routine wellness examination for [name].  Hearing/Vision screen Hearing Screening - Comments:: No hearing issues  Vision Screening - Comments:: Last eye exam greater than 12 months, no pending appointment. Patient is established with an eye doctor in [place] issues and exercise activities discussed: Current Exercise Habits: Home exercise routine, Type of exercise: calisthenics, Time (Minutes): 30, Frequency (Times/Week): 4, Weekly Exercise (Minutes/Week): 120   Goals Addressed   None    Depression Screen    09/20/2022    8:29 AM 08/28/2021    3:17 PM 05/28/2021    3:15 PM 12/21/2019    9:55 AM 12/31/2018   10:05 AM 12/15/2018    9:43 AM 02/18/2018    8:37 AM  PHQ 2/9 Scores  PHQ - 2 Score 0 0 0 0 0 0 2  PHQ- 9 Score  3   6    Fall Risk    09/20/2022    8:29 AM 03/04/2022    8:32 AM 09/07/2021    8:05 AM 08/28/2021    3:19 PM 07/12/2021    1:35 PM  Fall Risk   Falls in the past year? 0 0 0 0 0  Number falls in past yr: 0 0 0 0 0  Injury with Fall? 0 0 0 0 0  Risk for fall due to : No Fall Risks No Fall Risks No Fall Risks No Fall Risks No Fall Risks  Follow up Falls evaluation completed  Falls evaluation completed Falls evaluation completed Falls evaluation completed    FALL RISK PREVENTION PERTAINING TO THE HOME:  Any stairs in or around the home? No  If so, are there any without handrails?  na Home free of loose throw rugs in walkways, pet beds, electrical cords, etc? Yes  Adequate lighting in your home to reduce risk of falls? Yes   ASSISTIVE DEVICES UTILIZED TO PREVENT FALLS:  Life alert? No   Use of a cane, walker or w/c? Yes  Grab bars in the bathroom? Yes  Shower chair or bench in shower? Yes  Elevated toilet seat or a handicapped toilet? Yes   TIMED UP AND GO:  Was the test performed? No .    Cognitive Function:        09/20/2022    8:35 AM 08/28/2021    3:20 PM  6CIT Screen  What Year? 0 points 0 points  What month? 0 points 0 points  What time? 0 points 0 points  Count back from 20 0 points 0 points  Months in reverse 0 points 2 points  Repeat phrase 2 points 0 points  Total Score 2 points 2 points    Immunizations Immunization History  Administered Date(s) Administered   Pneumococcal Conjugate-13 02/21/2015   Tdap 04/11/2011    Declines tdap  Flu Vaccine status: Declined, Education has been provided regarding the importance of this vaccine but patient still declined. Advised may receive this vaccine at local pharmacy or Health Dept. Aware to provide a copy of the vaccination record if obtained from local pharmacy or Health Dept. Verbalized acceptance and understanding.  Pneumococcal vaccine status: Declined,  Education has been provided regarding the importance of this vaccine but patient still declined. Advised may receive this vaccine at local pharmacy or Health Dept. Aware to provide a copy of the vaccination record if obtained from local pharmacy or Health Dept. Verbalized acceptance and understanding.   Covid-19 vaccine status: Declined, Education has been provided regarding the importance of this vaccine but patient still declined. Advised may receive this vaccine at local pharmacy or Health Dept.or vaccine clinic. Aware to provide a copy of the vaccination record if obtained from local pharmacy or Health Dept. Verbalized acceptance and understanding.  Qualifies for Shingles Vaccine? Yes   Zostavax completed No   Shingrix Completed?: No.    Education has been provided regarding the importance of this vaccine. Patient has been advised to call  insurance company to determine out of pocket expense if they have not yet received this vaccine. Advised may also receive vaccine at local pharmacy or Health Dept. Verbalized acceptance and understanding.  Screening Tests Health Maintenance  Topic Date Due   Diabetic kidney evaluation - Urine ACR  12/20/2020   OPHTHALMOLOGY EXAM  05/02/2021   FOOT EXAM  05/28/2022   Pneumonia Vaccine 37+ Years old (2 of 2 - PPSV23 or  PCV20) 09/20/2023 (Originally 02/21/2016)   DEXA SCAN  09/20/2023 (Originally 06/04/2010)   COVID-19 Vaccine (1) 07/08/2048 (Originally 12/02/1946)   Zoster Vaccines- Shingrix (1 of 2) 07/08/2048 (Originally 06/05/1995)   HEMOGLOBIN A1C  02/24/2023   Diabetic kidney evaluation - eGFR measurement  09/05/2023   Hepatitis C Screening  Completed   HPV VACCINES  Aged Out   DTaP/Tdap/Td  Discontinued   INFLUENZA VACCINE  Discontinued   COLONOSCOPY (Pts 45-66yrs Insurance coverage will need to be confirmed)  Discontinued    Health Maintenance  Health Maintenance Due  Topic Date Due   Diabetic kidney evaluation - Urine ACR  12/20/2020   OPHTHALMOLOGY EXAM  05/02/2021   FOOT EXAM  05/28/2022    Colorectal cancer screening: No longer required.   Mammogram status: No longer required due to aged.    Lung Cancer Screening: (Low Dose CT Chest recommended if Age 93-80 years, 30 pack-year currently smoking OR have quit w/in 15years.) does not qualify.   Lung Cancer Screening Referral: na  Additional Screening:  Hepatitis C Screening: does qualify; Completed 2021  Vision Screening: Recommended annual ophthalmology exams for early detection of glaucoma and other disorders of the eye. Is the patient up to date with their annual eye exam?  No  Who is the provider or what is the name of the office in which the patient attends annual eye exams? [facility]  If pt is not established with a provider, would they like to be referred to a provider to establish care? No .   Dental  Screening: Recommended annual dental exams for proper oral hygiene  Community Resource Referral / Chronic Care Management: CRR required this visit?  No   CCM required this visit?  No      Plan:     I have personally reviewed and noted the following in the patient's chart:   Medical and social history Use of alcohol, tobacco or illicit drugs  Current medications and supplements including opioid prescriptions. Patient is not currently taking opioid prescriptions. Functional ability and status Nutritional status Physical activity Advanced directives List of other physicians Hospitalizations, surgeries, and ER visits in previous 12 months Vitals Screenings to include cognitive, depression, and falls Referrals and appointments  In addition, I have reviewed and discussed with patient certain preventive protocols, quality metrics, and best practice recommendations. A written personalized care plan for preventive services as well as general preventive health recommendations were provided to patient.     Lauree Chandler, NP   09/20/2022  Virtual Visit via Video Note  I connected with [name] on 09/20/22 at  8:40 AM EDT by a video enabled telemedicine application and verified that I am speaking with the correct person using two identifiers.  Location: Patient: home Provider: [place]   I discussed the limitations of evaluation and management by telemedicine and the availability of in person appointments. The patient expressed understanding and agreed to proceed.    I discussed the assessment and treatment plan with the patient. The patient was provided an opportunity to ask questions and all were answered. The patient agreed with the plan and demonstrated an understanding of the instructions.   The patient was advised to call back or seek an in-person evaluation if the symptoms worsen or if the condition fails to improve as anticipated.  I provided 15 minutes of  non-face-to-face time during this encounter.  [employer]. Dewaine Oats, AGNP Avs printed and mailed.

## 2022-09-20 NOTE — Patient Instructions (Signed)
[name] , Thank you for taking time to come for your Medicare Wellness Visit. I appreciate your ongoing commitment to your health goals. Please review the following plan we discussed and let me know if I can assist you in the future.   Screening recommendations/referrals: Colonoscopy aged out Mammogram aged out Bone Density declined Recommended yearly ophthalmology/optometry visit for glaucoma screening and checkup Recommended yearly dental visit for hygiene and checkup  Vaccinations: Influenza vaccine- due annually in September/October Pneumococcal vaccine declined Tdap vaccine declined Shingles vaccine declined     Advanced directives: on file  Conditions/risks identified: advanced age, hx of CVA  Next appointment: yearly   Preventive Care 19 Years and Older, Female Preventive care refers to lifestyle choices and visits with your health care provider that can promote health and wellness. What does preventive care include? A yearly physical exam. This is also called an annual well check. Dental exams once or twice a year. Routine eye exams. Ask your health care provider how often you should have your eyes checked. Personal lifestyle choices, including: Daily care of your teeth and gums. Regular physical activity. Eating a healthy diet. Avoiding tobacco and drug use. Limiting alcohol use. Practicing safe sex. Taking low-dose aspirin every day. Taking vitamin and mineral supplements as recommended by your health care provider. What happens during an annual well check? The services and screenings done by your health care provider during your annual well check will depend on your age, overall health, lifestyle risk factors, and family history of disease. Counseling  Your health care provider may ask you questions about your: Alcohol use. Tobacco use. Drug use. Emotional well-being. Home and relationship well-being. Sexual activity. Eating habits. History of  falls. Memory and ability to understand (cognition). Work and work Statistician. Reproductive health. Screening  You may have the following tests or measurements: Height, weight, and BMI. Blood pressure. Lipid and cholesterol levels. These may be checked every 5 years, or more frequently if you are over 22 years old. Skin check. Lung cancer screening. You may have this screening every year starting at age 81 if you have a 30-pack-year history of smoking and currently smoke or have quit within the past 15 years. Fecal occult blood test (FOBT) of the stool. You may have this test every year starting at age 105. Flexible sigmoidoscopy or colonoscopy. You may have a sigmoidoscopy every 5 years or a colonoscopy every 10 years starting at age 38. Hepatitis C blood test. Hepatitis B blood test. Sexually transmitted disease (STD) testing. Diabetes screening. This is done by checking your blood sugar (glucose) after you have not eaten for a while (fasting). You may have this done every 1-3 years. Bone density scan. This is done to screen for osteoporosis. You may have this done starting at age 64. Mammogram. This may be done every 1-2 years. Talk to your health care provider about how often you should have regular mammograms. Talk with your health care provider about your test results, treatment options, and if necessary, the need for more tests. Vaccines  Your health care provider may recommend certain vaccines, such as: Influenza vaccine. This is recommended every year. Tetanus, diphtheria, and acellular pertussis (Tdap, Td) vaccine. You may need a Td booster every 10 years. Zoster vaccine. You may need this after age 3. Pneumococcal 13-valent conjugate (PCV13) vaccine. One dose is recommended after age 50. Pneumococcal polysaccharide (PPSV23) vaccine. One dose is recommended after age 30. Talk to your health care provider about which screenings and vaccines you  need and how often you need  them. This information is not intended to replace advice given to you by your health care provider. Make sure you discuss any questions you have with your health care provider. Document Released: 07/21/2015 Document Revised: 03/13/2016 Document Reviewed: 04/25/2015 Elsevier Interactive Patient Education  2017 [place] Prevention in the Home Falls can cause injuries. They can happen to people of all ages. There are many things you can do to make your home safe and to help prevent falls. What can I do on the outside of my home? Regularly fix the edges of walkways and driveways and fix any cracks. Remove anything that might make you trip as you walk through a door, such as a raised step or threshold. Trim any bushes or trees on the path to your home. Use bright outdoor lighting. Clear any walking paths of anything that might make someone trip, such as rocks or tools. Regularly check to see if handrails are loose or broken. Make sure that both sides of any steps have handrails. Any raised decks and porches should have guardrails on the edges. Have any leaves, snow, or ice cleared regularly. Use sand or salt on walking paths during winter. Clean up any spills in your garage right away. This includes oil or grease spills. What can I do in the bathroom? Use night lights. Install grab bars by the toilet and in the tub and shower. Do not use towel bars as grab bars. Use non-skid mats or decals in the tub or shower. If you need to sit down in the shower, use a plastic, non-slip stool. Keep the floor dry. Clean up any water that spills on the floor as soon as it happens. Remove soap buildup in the tub or shower regularly. Attach bath mats securely with double-sided non-slip rug tape. Do not have throw rugs and other things on the floor that can make you trip. What can I do in the bedroom? Use night lights. Make sure that you have a light by your bed that is easy to reach. Do not use  any sheets or blankets that are too big for your bed. They should not hang down onto the floor. Have a firm chair that has side arms. You can use this for support while you get dressed. Do not have throw rugs and other things on the floor that can make you trip. What can I do in the kitchen? Clean up any spills right away. Avoid walking on wet floors. Keep items that you use a lot in easy-to-reach places. If you need to reach something above you, use a strong step stool that has a grab bar. Keep electrical cords out of the way. Do not use floor polish or wax that makes floors slippery. If you must use wax, use non-skid floor wax. Do not have throw rugs and other things on the floor that can make you trip. What can I do with my stairs? Do not leave any items on the stairs. Make sure that there are handrails on both sides of the stairs and use them. Fix handrails that are broken or loose. Make sure that handrails are as long as the stairways. Check any carpeting to make sure that it is firmly attached to the stairs. Fix any carpet that is loose or worn. Avoid having throw rugs at the top or bottom of the stairs. If you do have throw rugs, attach them to the floor with carpet tape. Make sure that  you have a light switch at the top of the stairs and the bottom of the stairs. If you do not have them, ask someone to add them for you. What else can I do to help prevent falls? Wear shoes that: Do not have high heels. Have rubber bottoms. Are comfortable and fit you well. Are closed at the toe. Do not wear sandals. If you use a stepladder: Make sure that it is fully opened. Do not climb a closed stepladder. Make sure that both sides of the stepladder are locked into place. Ask someone to hold it for you, if possible. Clearly mark and make sure that you can see: Any grab bars or handrails. First and last steps. Where the edge of each step is. Use tools that help you move around (mobility aids)  if they are needed. These include: Canes. Walkers. Scooters. Crutches. Turn on the lights when you go into a dark area. Replace any light bulbs as soon as they burn out. Set up your furniture so you have a clear path. Avoid moving your furniture around. If any of your floors are uneven, fix them. If there are any pets around you, be aware of where they are. Review your medicines with your doctor. Some medicines can make you feel dizzy. This can increase your chance of falling. Ask your doctor what other things that you can do to help prevent falls. This information is not intended to replace advice given to you by your health care provider. Make sure you discuss any questions you have with your health care provider. Document Released: 04/20/2009 Document Revised: 11/30/2015 Document Reviewed: 07/29/2014 Elsevier Interactive Patient Education  2017 [employer].

## 2022-09-21 ENCOUNTER — Other Ambulatory Visit (HOSPITAL_COMMUNITY): Payer: Self-pay

## 2022-09-23 ENCOUNTER — Encounter: Payer: Medicare PPO | Attending: Physical Medicine and Rehabilitation | Admitting: Physical Medicine and Rehabilitation

## 2022-09-23 ENCOUNTER — Other Ambulatory Visit: Payer: Self-pay

## 2022-09-23 VITALS — BP 179/74 | HR 75 | Ht 62.0 in

## 2022-09-23 DIAGNOSIS — I639 Cerebral infarction, unspecified: Secondary | ICD-10-CM | POA: Diagnosis not present

## 2022-09-23 DIAGNOSIS — N189 Chronic kidney disease, unspecified: Secondary | ICD-10-CM

## 2022-09-23 DIAGNOSIS — K117 Disturbances of salivary secretion: Secondary | ICD-10-CM

## 2022-09-23 DIAGNOSIS — E119 Type 2 diabetes mellitus without complications: Secondary | ICD-10-CM | POA: Diagnosis not present

## 2022-09-23 DIAGNOSIS — H9201 Otalgia, right ear: Secondary | ICD-10-CM

## 2022-09-23 DIAGNOSIS — R0902 Hypoxemia: Secondary | ICD-10-CM

## 2022-09-23 MED ORDER — LOSARTAN POTASSIUM 25 MG PO TABS: 25.0000 mg | ORAL_TABLET | Freq: Every day | ORAL | 3 refills | Status: DC

## 2022-09-23 MED ORDER — ASPIRIN 81 MG PO CHEW: 81.0000 mg | CHEWABLE_TABLET | Freq: Every day | ORAL | 3 refills | Status: DC

## 2022-09-23 NOTE — Progress Notes (Addendum)
Subjective:    Patient ID: [name], female    DOB: Feb 02, 1946, 77 y.o.   MRN: [id number]  HPI [name] is a 77 year old woman who presents for hospital follow-up after CVA  1) CVA -doing well with pureed diet -has been using [employer] -getting PT and SLP -asks about oxygen therapy  2) Anxiety: -chewing gum helps  3) Drooling -resolved -has not needed scopolamine patch  4) HTN -needs refill of medication  Pain Inventory Average Pain 2 Pain Right Now 0   BOWEL Number of stools per week: 4    BLADDER Normal    Mobility ability to climb steps?  no do you drive?  no use a wheelchair needs help with transfers Do you have any goals in this area?  yes  Function retired I need assistance with the following:  dressing, bathing, toileting, and meal prep Do you have any goals in this area?  yes  Neuro/Psych trouble walking anxiety  Prior Studies Any changes since last visit?  no  Physicians involved in your care Any changes since last visit?  no   Family History  Problem Relation Age of Onset   Kidney disease Mother    Congestive Heart Failure Father    COPD Father    COPD Brother    Diabetes Maternal Aunt    Social History   Socioeconomic History   Marital status: Legally Separated    Spouse name: Rennis Harding   Number of children: 2   Years of education: Highschool and 1 year of collwege in business    Highest education level: High school graduate  Occupational History   Not on file  Tobacco Use   Smoking status: Never   Smokeless tobacco: Never  Vaping Use   Vaping Use: Never used  Substance and Sexual Activity   Alcohol use: No   Drug use: Never   Sexual activity: Not Currently  Other Topics Concern   Not on file  Social History Narrative   Tobacco use, amount per day now: None/Never Smoked   Past tobacco use, amount per day:   How many years did you use tobacco:   Alcohol use (drinks per week): Never    Diet:  Carb modified.   Do you drink/eat things with caffeine: yes sometimes.   Marital status:   Single                               What year were you married?   Do you live in a house, apartment, assisted living, condo, trailer, etc.? House   Is it one or more stories? One   How many persons live in your home? Daughter   Do you have pets in your home?( please list) None.   Highest Level of education completed? Associates Degree   Current or past profession: [employer]    Do you exercise?   No                               Type and how often?   Do you have a living will? Yes   Do you have a DNR form?     No                              If not, do  you want to discuss one?   Do you have signed POA/HPOA forms?  Yes                   If so, please bring to you appointment      Do you have any difficulty bathing or dressing yourself? Yes   Do you have any difficulty preparing food or eating? No   Do you have any difficulty managing your medications? No   Do you have any difficulty managing your finances? No   Do you have any difficulty affording your medications? No   Social Determinants of Health   Financial Resource Strain: Medium Risk (02/27/2018)   Overall Financial Resource Strain (CARDIA)    Difficulty of Paying Living Expenses: Somewhat hard  Food Insecurity: No Food Insecurity (08/07/2022)   Hunger Vital Sign    Worried About Running Out of Food in the Last Year: Never true    Ran Out of Food in the Last Year: Never true  Transportation Needs: No Transportation Needs (08/07/2022)   PRAPARE - Hydrologist (Medical): No    Lack of Transportation (Non-Medical): No  Physical Activity: Inactive (02/27/2018)   Exercise Vital Sign    Days of Exercise per Week: 0 days    Minutes of Exercise per Session: 0 min  Stress: No Stress Concern Present (02/27/2018)   [place]    Feeling of  Stress : Not at all  Social Connections: Somewhat Isolated (02/27/2018)   Social Connection and Isolation Panel [NHANES]    Frequency of Communication with Friends and Family: More than three times a week    Frequency of Social Gatherings with Friends and Family: Twice a week    Attends Religious Services: Never    Marine scientist or Organizations: No    Attends Archivist Meetings: Never    Marital Status: Married   Past Surgical History:  Procedure Laterality Date   callous removal  Left 2017   located on 1st digit on L foot 2/2 diabetes   EYE SURGERY     Past Medical History:  Diagnosis Date   Allergy    Arthritis    Broken ankle    Cataract    Diabetes mellitus without complication ([place])    History of colonoscopy    Hypertension    Macular degeneration syndrome    with edema---being treated.    Stage 4 chronic kidney disease ([place])    Stroke ([place]) 2019   There were no vitals taken for this visit.  Opioid Risk Score:   Fall Risk Score:  `1  Depression screen [facility] 2/9     09/20/2022    8:29 AM 08/28/2021    3:17 PM 05/28/2021    3:15 PM 12/21/2019    9:55 AM 12/31/2018   10:05 AM 12/15/2018    9:43 AM 02/18/2018    8:37 AM  Depression screen PHQ 2/9  Decreased Interest 0 0 0 0 0 0 1  Down, Depressed, Hopeless 0 0 0 0 0 0 1  PHQ - 2 Score 0 0 0 0 0 0 2  Altered sleeping    0   1  Tired, decreased energy    1   2  Change in appetite    0   0  Feeling bad or failure about yourself     1   1  Trouble concentrating    1  0  Moving slowly or fidgety/restless    0   0  Suicidal thoughts    0   0  PHQ-9 Score    3   6  Difficult doing work/chores    Somewhat difficult       Review of Systems  Musculoskeletal:  Positive for gait problem.  Psychiatric/Behavioral:         ANXIETY  All other systems reviewed and are negative.      Objective:   Physical Exam Gen: no distress, normal appearing HEENT: oral mucosa pink and moist, NCAT Cardio: Reg  rate Chest: normal effort, normal rate of breathing Abd: soft, non-distended Ext: no edema Psych: pleasant, normal affect Skin: intact Neuro: Alert and oriented x3. Post stroke spasticity       Assessment & Plan:  1) CVA -continue therapies -would benefit from handicap placard to increase mobility in the community -reviewed all medications and provided necessary refills. -discussed medically tailored meals.  -discussed hyperbaric oxygen therapy -refilled aspirin  2) Drooling -d/c scopolamine patch  3) Type 2 diabetes: -discussed excellent control -continue black current seed oil  4) HTN -refilled cozaar  5) Right ear numbness: -referred to ENT  6) Skin tear -continue manuka honey daily.   7) Oxygen desaturation: -patient is currently severely limited in ambulation due to stroke -She desats to 93% with rest and activity

## 2022-09-24 ENCOUNTER — Encounter: Payer: Self-pay | Admitting: Physical Medicine and Rehabilitation

## 2022-09-24 ENCOUNTER — Telehealth: Payer: Self-pay

## 2022-09-24 DIAGNOSIS — M1990 Unspecified osteoarthritis, unspecified site: Secondary | ICD-10-CM | POA: Diagnosis not present

## 2022-09-24 DIAGNOSIS — E1122 Type 2 diabetes mellitus with diabetic chronic kidney disease: Secondary | ICD-10-CM | POA: Diagnosis not present

## 2022-09-24 DIAGNOSIS — I69391 Dysphagia following cerebral infarction: Secondary | ICD-10-CM | POA: Diagnosis not present

## 2022-09-24 DIAGNOSIS — Z87440 Personal history of urinary (tract) infections: Secondary | ICD-10-CM | POA: Diagnosis not present

## 2022-09-24 DIAGNOSIS — I69354 Hemiplegia and hemiparesis following cerebral infarction affecting left non-dominant side: Secondary | ICD-10-CM | POA: Diagnosis not present

## 2022-09-24 DIAGNOSIS — K5900 Constipation, unspecified: Secondary | ICD-10-CM | POA: Diagnosis not present

## 2022-09-24 DIAGNOSIS — N184 Chronic kidney disease, stage 4 (severe): Secondary | ICD-10-CM | POA: Diagnosis not present

## 2022-09-24 DIAGNOSIS — E785 Hyperlipidemia, unspecified: Secondary | ICD-10-CM | POA: Diagnosis not present

## 2022-09-24 DIAGNOSIS — I129 Hypertensive chronic kidney disease with stage 1 through stage 4 chronic kidney disease, or unspecified chronic kidney disease: Secondary | ICD-10-CM | POA: Diagnosis not present

## 2022-09-24 NOTE — Telephone Encounter (Signed)
Jalene Mullet, PT with [facility], called about patient's BP. She stated that after exercising patient's bp was 170/80 and before exercising it was 160/80. She said that patient's family told her that her bp is always reading in the 170s. She was just calling as FYI.  Message sent to Sherrie Mustache, NP

## 2022-09-24 NOTE — Telephone Encounter (Signed)
Palliative Care Telephone Note  Spoke to daughter [name] who reports she is trying to get in-home therapy for the patient. She states that she will call alliative Care when things settle down with the patient's in home providers. LPN acknowledged.

## 2022-09-25 NOTE — Addendum Note (Signed)
Addended by: Izora Ribas on: 09/25/2022 11:37 AM   Modules accepted: Orders

## 2022-09-26 DIAGNOSIS — M1990 Unspecified osteoarthritis, unspecified site: Secondary | ICD-10-CM | POA: Diagnosis not present

## 2022-09-26 DIAGNOSIS — I69354 Hemiplegia and hemiparesis following cerebral infarction affecting left non-dominant side: Secondary | ICD-10-CM | POA: Diagnosis not present

## 2022-09-26 DIAGNOSIS — I69391 Dysphagia following cerebral infarction: Secondary | ICD-10-CM | POA: Diagnosis not present

## 2022-09-26 DIAGNOSIS — E785 Hyperlipidemia, unspecified: Secondary | ICD-10-CM | POA: Diagnosis not present

## 2022-09-26 DIAGNOSIS — E1122 Type 2 diabetes mellitus with diabetic chronic kidney disease: Secondary | ICD-10-CM | POA: Diagnosis not present

## 2022-09-26 DIAGNOSIS — Z87440 Personal history of urinary (tract) infections: Secondary | ICD-10-CM | POA: Diagnosis not present

## 2022-09-26 DIAGNOSIS — K5900 Constipation, unspecified: Secondary | ICD-10-CM | POA: Diagnosis not present

## 2022-09-26 DIAGNOSIS — N184 Chronic kidney disease, stage 4 (severe): Secondary | ICD-10-CM | POA: Diagnosis not present

## 2022-09-26 DIAGNOSIS — I129 Hypertensive chronic kidney disease with stage 1 through stage 4 chronic kidney disease, or unspecified chronic kidney disease: Secondary | ICD-10-CM | POA: Diagnosis not present

## 2022-09-27 DIAGNOSIS — I69391 Dysphagia following cerebral infarction: Secondary | ICD-10-CM | POA: Diagnosis not present

## 2022-09-27 DIAGNOSIS — M1990 Unspecified osteoarthritis, unspecified site: Secondary | ICD-10-CM | POA: Diagnosis not present

## 2022-09-27 DIAGNOSIS — N184 Chronic kidney disease, stage 4 (severe): Secondary | ICD-10-CM | POA: Diagnosis not present

## 2022-09-27 DIAGNOSIS — Z87440 Personal history of urinary (tract) infections: Secondary | ICD-10-CM | POA: Diagnosis not present

## 2022-09-27 DIAGNOSIS — I129 Hypertensive chronic kidney disease with stage 1 through stage 4 chronic kidney disease, or unspecified chronic kidney disease: Secondary | ICD-10-CM | POA: Diagnosis not present

## 2022-09-27 DIAGNOSIS — I69354 Hemiplegia and hemiparesis following cerebral infarction affecting left non-dominant side: Secondary | ICD-10-CM | POA: Diagnosis not present

## 2022-09-27 DIAGNOSIS — E1122 Type 2 diabetes mellitus with diabetic chronic kidney disease: Secondary | ICD-10-CM | POA: Diagnosis not present

## 2022-09-27 DIAGNOSIS — K5900 Constipation, unspecified: Secondary | ICD-10-CM | POA: Diagnosis not present

## 2022-09-27 DIAGNOSIS — E785 Hyperlipidemia, unspecified: Secondary | ICD-10-CM | POA: Diagnosis not present

## 2022-09-30 ENCOUNTER — Telehealth: Payer: Self-pay

## 2022-09-30 DIAGNOSIS — I69354 Hemiplegia and hemiparesis following cerebral infarction affecting left non-dominant side: Secondary | ICD-10-CM | POA: Diagnosis not present

## 2022-09-30 DIAGNOSIS — I69391 Dysphagia following cerebral infarction: Secondary | ICD-10-CM | POA: Diagnosis not present

## 2022-09-30 DIAGNOSIS — M1990 Unspecified osteoarthritis, unspecified site: Secondary | ICD-10-CM | POA: Diagnosis not present

## 2022-09-30 DIAGNOSIS — E785 Hyperlipidemia, unspecified: Secondary | ICD-10-CM | POA: Diagnosis not present

## 2022-09-30 DIAGNOSIS — I129 Hypertensive chronic kidney disease with stage 1 through stage 4 chronic kidney disease, or unspecified chronic kidney disease: Secondary | ICD-10-CM | POA: Diagnosis not present

## 2022-09-30 DIAGNOSIS — N184 Chronic kidney disease, stage 4 (severe): Secondary | ICD-10-CM | POA: Diagnosis not present

## 2022-09-30 DIAGNOSIS — E1122 Type 2 diabetes mellitus with diabetic chronic kidney disease: Secondary | ICD-10-CM | POA: Diagnosis not present

## 2022-09-30 DIAGNOSIS — K5900 Constipation, unspecified: Secondary | ICD-10-CM | POA: Diagnosis not present

## 2022-09-30 DIAGNOSIS — Z87440 Personal history of urinary (tract) infections: Secondary | ICD-10-CM | POA: Diagnosis not present

## 2022-09-30 NOTE — Telephone Encounter (Signed)
Radovan, [place] with [facility] called requesting verbal orders for OT 1 time a week for 7 weeks.  Verbal orders were given

## 2022-10-01 DIAGNOSIS — E119 Type 2 diabetes mellitus without complications: Secondary | ICD-10-CM | POA: Diagnosis not present

## 2022-10-01 DIAGNOSIS — I639 Cerebral infarction, unspecified: Secondary | ICD-10-CM | POA: Diagnosis not present

## 2022-10-01 DIAGNOSIS — R1310 Dysphagia, unspecified: Secondary | ICD-10-CM | POA: Diagnosis not present

## 2022-10-01 DIAGNOSIS — G8194 Hemiplegia, unspecified affecting left nondominant side: Secondary | ICD-10-CM | POA: Diagnosis not present

## 2022-10-02 DIAGNOSIS — R293 Abnormal posture: Secondary | ICD-10-CM | POA: Diagnosis not present

## 2022-10-02 DIAGNOSIS — M9903 Segmental and somatic dysfunction of lumbar region: Secondary | ICD-10-CM | POA: Diagnosis not present

## 2022-10-02 DIAGNOSIS — M9901 Segmental and somatic dysfunction of cervical region: Secondary | ICD-10-CM | POA: Diagnosis not present

## 2022-10-02 DIAGNOSIS — M5459 Other low back pain: Secondary | ICD-10-CM | POA: Diagnosis not present

## 2022-10-02 DIAGNOSIS — M9902 Segmental and somatic dysfunction of thoracic region: Secondary | ICD-10-CM | POA: Diagnosis not present

## 2022-10-04 DIAGNOSIS — E785 Hyperlipidemia, unspecified: Secondary | ICD-10-CM | POA: Diagnosis not present

## 2022-10-04 DIAGNOSIS — K5900 Constipation, unspecified: Secondary | ICD-10-CM | POA: Diagnosis not present

## 2022-10-04 DIAGNOSIS — I69354 Hemiplegia and hemiparesis following cerebral infarction affecting left non-dominant side: Secondary | ICD-10-CM | POA: Diagnosis not present

## 2022-10-04 DIAGNOSIS — M1990 Unspecified osteoarthritis, unspecified site: Secondary | ICD-10-CM | POA: Diagnosis not present

## 2022-10-04 DIAGNOSIS — I129 Hypertensive chronic kidney disease with stage 1 through stage 4 chronic kidney disease, or unspecified chronic kidney disease: Secondary | ICD-10-CM | POA: Diagnosis not present

## 2022-10-04 DIAGNOSIS — Z87440 Personal history of urinary (tract) infections: Secondary | ICD-10-CM | POA: Diagnosis not present

## 2022-10-04 DIAGNOSIS — N184 Chronic kidney disease, stage 4 (severe): Secondary | ICD-10-CM | POA: Diagnosis not present

## 2022-10-04 DIAGNOSIS — I69391 Dysphagia following cerebral infarction: Secondary | ICD-10-CM | POA: Diagnosis not present

## 2022-10-04 DIAGNOSIS — E1122 Type 2 diabetes mellitus with diabetic chronic kidney disease: Secondary | ICD-10-CM | POA: Diagnosis not present

## 2022-10-07 DIAGNOSIS — E785 Hyperlipidemia, unspecified: Secondary | ICD-10-CM | POA: Diagnosis not present

## 2022-10-07 DIAGNOSIS — Z87440 Personal history of urinary (tract) infections: Secondary | ICD-10-CM | POA: Diagnosis not present

## 2022-10-07 DIAGNOSIS — K5900 Constipation, unspecified: Secondary | ICD-10-CM | POA: Diagnosis not present

## 2022-10-07 DIAGNOSIS — I69391 Dysphagia following cerebral infarction: Secondary | ICD-10-CM | POA: Diagnosis not present

## 2022-10-07 DIAGNOSIS — M1990 Unspecified osteoarthritis, unspecified site: Secondary | ICD-10-CM | POA: Diagnosis not present

## 2022-10-07 DIAGNOSIS — N184 Chronic kidney disease, stage 4 (severe): Secondary | ICD-10-CM | POA: Diagnosis not present

## 2022-10-07 DIAGNOSIS — I129 Hypertensive chronic kidney disease with stage 1 through stage 4 chronic kidney disease, or unspecified chronic kidney disease: Secondary | ICD-10-CM | POA: Diagnosis not present

## 2022-10-07 DIAGNOSIS — I69354 Hemiplegia and hemiparesis following cerebral infarction affecting left non-dominant side: Secondary | ICD-10-CM | POA: Diagnosis not present

## 2022-10-07 DIAGNOSIS — E1122 Type 2 diabetes mellitus with diabetic chronic kidney disease: Secondary | ICD-10-CM | POA: Diagnosis not present

## 2022-10-08 DIAGNOSIS — E1122 Type 2 diabetes mellitus with diabetic chronic kidney disease: Secondary | ICD-10-CM | POA: Diagnosis not present

## 2022-10-08 DIAGNOSIS — I69354 Hemiplegia and hemiparesis following cerebral infarction affecting left non-dominant side: Secondary | ICD-10-CM | POA: Diagnosis not present

## 2022-10-08 DIAGNOSIS — I129 Hypertensive chronic kidney disease with stage 1 through stage 4 chronic kidney disease, or unspecified chronic kidney disease: Secondary | ICD-10-CM | POA: Diagnosis not present

## 2022-10-08 DIAGNOSIS — M1990 Unspecified osteoarthritis, unspecified site: Secondary | ICD-10-CM | POA: Diagnosis not present

## 2022-10-08 DIAGNOSIS — I69391 Dysphagia following cerebral infarction: Secondary | ICD-10-CM | POA: Diagnosis not present

## 2022-10-08 DIAGNOSIS — Z87440 Personal history of urinary (tract) infections: Secondary | ICD-10-CM | POA: Diagnosis not present

## 2022-10-08 DIAGNOSIS — N184 Chronic kidney disease, stage 4 (severe): Secondary | ICD-10-CM | POA: Diagnosis not present

## 2022-10-08 DIAGNOSIS — K5900 Constipation, unspecified: Secondary | ICD-10-CM | POA: Diagnosis not present

## 2022-10-08 DIAGNOSIS — E785 Hyperlipidemia, unspecified: Secondary | ICD-10-CM | POA: Diagnosis not present

## 2022-10-09 DIAGNOSIS — E1122 Type 2 diabetes mellitus with diabetic chronic kidney disease: Secondary | ICD-10-CM | POA: Diagnosis not present

## 2022-10-09 DIAGNOSIS — E785 Hyperlipidemia, unspecified: Secondary | ICD-10-CM | POA: Diagnosis not present

## 2022-10-09 DIAGNOSIS — M1990 Unspecified osteoarthritis, unspecified site: Secondary | ICD-10-CM | POA: Diagnosis not present

## 2022-10-09 DIAGNOSIS — Z87440 Personal history of urinary (tract) infections: Secondary | ICD-10-CM | POA: Diagnosis not present

## 2022-10-09 DIAGNOSIS — N184 Chronic kidney disease, stage 4 (severe): Secondary | ICD-10-CM | POA: Diagnosis not present

## 2022-10-09 DIAGNOSIS — K5900 Constipation, unspecified: Secondary | ICD-10-CM | POA: Diagnosis not present

## 2022-10-09 DIAGNOSIS — I69391 Dysphagia following cerebral infarction: Secondary | ICD-10-CM | POA: Diagnosis not present

## 2022-10-09 DIAGNOSIS — I69354 Hemiplegia and hemiparesis following cerebral infarction affecting left non-dominant side: Secondary | ICD-10-CM | POA: Diagnosis not present

## 2022-10-09 DIAGNOSIS — I129 Hypertensive chronic kidney disease with stage 1 through stage 4 chronic kidney disease, or unspecified chronic kidney disease: Secondary | ICD-10-CM | POA: Diagnosis not present

## 2022-10-10 DIAGNOSIS — M1990 Unspecified osteoarthritis, unspecified site: Secondary | ICD-10-CM | POA: Diagnosis not present

## 2022-10-10 DIAGNOSIS — I69354 Hemiplegia and hemiparesis following cerebral infarction affecting left non-dominant side: Secondary | ICD-10-CM | POA: Diagnosis not present

## 2022-10-10 DIAGNOSIS — K5900 Constipation, unspecified: Secondary | ICD-10-CM | POA: Diagnosis not present

## 2022-10-10 DIAGNOSIS — I129 Hypertensive chronic kidney disease with stage 1 through stage 4 chronic kidney disease, or unspecified chronic kidney disease: Secondary | ICD-10-CM | POA: Diagnosis not present

## 2022-10-10 DIAGNOSIS — Z87440 Personal history of urinary (tract) infections: Secondary | ICD-10-CM | POA: Diagnosis not present

## 2022-10-10 DIAGNOSIS — I69391 Dysphagia following cerebral infarction: Secondary | ICD-10-CM | POA: Diagnosis not present

## 2022-10-10 DIAGNOSIS — N184 Chronic kidney disease, stage 4 (severe): Secondary | ICD-10-CM | POA: Diagnosis not present

## 2022-10-10 DIAGNOSIS — E1122 Type 2 diabetes mellitus with diabetic chronic kidney disease: Secondary | ICD-10-CM | POA: Diagnosis not present

## 2022-10-10 DIAGNOSIS — E785 Hyperlipidemia, unspecified: Secondary | ICD-10-CM | POA: Diagnosis not present

## 2022-10-15 DIAGNOSIS — Z87440 Personal history of urinary (tract) infections: Secondary | ICD-10-CM | POA: Diagnosis not present

## 2022-10-15 DIAGNOSIS — I129 Hypertensive chronic kidney disease with stage 1 through stage 4 chronic kidney disease, or unspecified chronic kidney disease: Secondary | ICD-10-CM | POA: Diagnosis not present

## 2022-10-15 DIAGNOSIS — N184 Chronic kidney disease, stage 4 (severe): Secondary | ICD-10-CM | POA: Diagnosis not present

## 2022-10-15 DIAGNOSIS — E1122 Type 2 diabetes mellitus with diabetic chronic kidney disease: Secondary | ICD-10-CM | POA: Diagnosis not present

## 2022-10-15 DIAGNOSIS — E785 Hyperlipidemia, unspecified: Secondary | ICD-10-CM | POA: Diagnosis not present

## 2022-10-15 DIAGNOSIS — I69391 Dysphagia following cerebral infarction: Secondary | ICD-10-CM | POA: Diagnosis not present

## 2022-10-15 DIAGNOSIS — K5900 Constipation, unspecified: Secondary | ICD-10-CM | POA: Diagnosis not present

## 2022-10-15 DIAGNOSIS — I69354 Hemiplegia and hemiparesis following cerebral infarction affecting left non-dominant side: Secondary | ICD-10-CM | POA: Diagnosis not present

## 2022-10-15 DIAGNOSIS — M1990 Unspecified osteoarthritis, unspecified site: Secondary | ICD-10-CM | POA: Diagnosis not present

## 2022-10-16 DIAGNOSIS — I129 Hypertensive chronic kidney disease with stage 1 through stage 4 chronic kidney disease, or unspecified chronic kidney disease: Secondary | ICD-10-CM | POA: Diagnosis not present

## 2022-10-16 DIAGNOSIS — Z87440 Personal history of urinary (tract) infections: Secondary | ICD-10-CM | POA: Diagnosis not present

## 2022-10-16 DIAGNOSIS — I69391 Dysphagia following cerebral infarction: Secondary | ICD-10-CM | POA: Diagnosis not present

## 2022-10-16 DIAGNOSIS — E785 Hyperlipidemia, unspecified: Secondary | ICD-10-CM | POA: Diagnosis not present

## 2022-10-16 DIAGNOSIS — E1122 Type 2 diabetes mellitus with diabetic chronic kidney disease: Secondary | ICD-10-CM | POA: Diagnosis not present

## 2022-10-16 DIAGNOSIS — M1990 Unspecified osteoarthritis, unspecified site: Secondary | ICD-10-CM | POA: Diagnosis not present

## 2022-10-16 DIAGNOSIS — I69354 Hemiplegia and hemiparesis following cerebral infarction affecting left non-dominant side: Secondary | ICD-10-CM | POA: Diagnosis not present

## 2022-10-16 DIAGNOSIS — K5900 Constipation, unspecified: Secondary | ICD-10-CM | POA: Diagnosis not present

## 2022-10-16 DIAGNOSIS — N184 Chronic kidney disease, stage 4 (severe): Secondary | ICD-10-CM | POA: Diagnosis not present

## 2022-10-17 DIAGNOSIS — E785 Hyperlipidemia, unspecified: Secondary | ICD-10-CM | POA: Diagnosis not present

## 2022-10-17 DIAGNOSIS — M1990 Unspecified osteoarthritis, unspecified site: Secondary | ICD-10-CM | POA: Diagnosis not present

## 2022-10-17 DIAGNOSIS — Z87440 Personal history of urinary (tract) infections: Secondary | ICD-10-CM | POA: Diagnosis not present

## 2022-10-17 DIAGNOSIS — K5900 Constipation, unspecified: Secondary | ICD-10-CM | POA: Diagnosis not present

## 2022-10-17 DIAGNOSIS — I69391 Dysphagia following cerebral infarction: Secondary | ICD-10-CM | POA: Diagnosis not present

## 2022-10-17 DIAGNOSIS — I69354 Hemiplegia and hemiparesis following cerebral infarction affecting left non-dominant side: Secondary | ICD-10-CM | POA: Diagnosis not present

## 2022-10-17 DIAGNOSIS — I129 Hypertensive chronic kidney disease with stage 1 through stage 4 chronic kidney disease, or unspecified chronic kidney disease: Secondary | ICD-10-CM | POA: Diagnosis not present

## 2022-10-17 DIAGNOSIS — N184 Chronic kidney disease, stage 4 (severe): Secondary | ICD-10-CM | POA: Diagnosis not present

## 2022-10-17 DIAGNOSIS — E1122 Type 2 diabetes mellitus with diabetic chronic kidney disease: Secondary | ICD-10-CM | POA: Diagnosis not present

## 2022-10-18 DIAGNOSIS — K5900 Constipation, unspecified: Secondary | ICD-10-CM | POA: Diagnosis not present

## 2022-10-18 DIAGNOSIS — I69354 Hemiplegia and hemiparesis following cerebral infarction affecting left non-dominant side: Secondary | ICD-10-CM | POA: Diagnosis not present

## 2022-10-18 DIAGNOSIS — M1990 Unspecified osteoarthritis, unspecified site: Secondary | ICD-10-CM | POA: Diagnosis not present

## 2022-10-18 DIAGNOSIS — E1122 Type 2 diabetes mellitus with diabetic chronic kidney disease: Secondary | ICD-10-CM | POA: Diagnosis not present

## 2022-10-18 DIAGNOSIS — I129 Hypertensive chronic kidney disease with stage 1 through stage 4 chronic kidney disease, or unspecified chronic kidney disease: Secondary | ICD-10-CM | POA: Diagnosis not present

## 2022-10-18 DIAGNOSIS — Z87440 Personal history of urinary (tract) infections: Secondary | ICD-10-CM | POA: Diagnosis not present

## 2022-10-18 DIAGNOSIS — E785 Hyperlipidemia, unspecified: Secondary | ICD-10-CM | POA: Diagnosis not present

## 2022-10-18 DIAGNOSIS — N184 Chronic kidney disease, stage 4 (severe): Secondary | ICD-10-CM | POA: Diagnosis not present

## 2022-10-18 DIAGNOSIS — I69391 Dysphagia following cerebral infarction: Secondary | ICD-10-CM | POA: Diagnosis not present

## 2022-10-23 ENCOUNTER — Telehealth: Payer: Self-pay

## 2022-10-23 DIAGNOSIS — Z87440 Personal history of urinary (tract) infections: Secondary | ICD-10-CM | POA: Diagnosis not present

## 2022-10-23 DIAGNOSIS — N184 Chronic kidney disease, stage 4 (severe): Secondary | ICD-10-CM | POA: Diagnosis not present

## 2022-10-23 DIAGNOSIS — M1990 Unspecified osteoarthritis, unspecified site: Secondary | ICD-10-CM | POA: Diagnosis not present

## 2022-10-23 DIAGNOSIS — E785 Hyperlipidemia, unspecified: Secondary | ICD-10-CM | POA: Diagnosis not present

## 2022-10-23 DIAGNOSIS — I129 Hypertensive chronic kidney disease with stage 1 through stage 4 chronic kidney disease, or unspecified chronic kidney disease: Secondary | ICD-10-CM | POA: Diagnosis not present

## 2022-10-23 DIAGNOSIS — E1122 Type 2 diabetes mellitus with diabetic chronic kidney disease: Secondary | ICD-10-CM | POA: Diagnosis not present

## 2022-10-23 DIAGNOSIS — I69354 Hemiplegia and hemiparesis following cerebral infarction affecting left non-dominant side: Secondary | ICD-10-CM | POA: Diagnosis not present

## 2022-10-23 DIAGNOSIS — I69391 Dysphagia following cerebral infarction: Secondary | ICD-10-CM | POA: Diagnosis not present

## 2022-10-23 DIAGNOSIS — K5900 Constipation, unspecified: Secondary | ICD-10-CM | POA: Diagnosis not present

## 2022-10-23 NOTE — Telephone Encounter (Signed)
Galina, physical therapist with [facility] was at the home of patient and called per protocol due to patient sliding from chair onto the floor. Patient did not encounter any injuries, vitals are normal, and the Fire Department was called to help patient off the floor.

## 2022-10-23 NOTE — Telephone Encounter (Signed)
Incoming call received from Trish with [facility] requesting verbal orders to extend PT for 2 times weekly for 3 more weeks, effective today.  Per [facility] standing order, verbal order provided. Message will be sent to patient's PCP as a FYI.

## 2022-10-24 ENCOUNTER — Telehealth: Payer: Self-pay

## 2022-10-24 DIAGNOSIS — E1122 Type 2 diabetes mellitus with diabetic chronic kidney disease: Secondary | ICD-10-CM | POA: Diagnosis not present

## 2022-10-24 DIAGNOSIS — K5900 Constipation, unspecified: Secondary | ICD-10-CM | POA: Diagnosis not present

## 2022-10-24 DIAGNOSIS — I69354 Hemiplegia and hemiparesis following cerebral infarction affecting left non-dominant side: Secondary | ICD-10-CM | POA: Diagnosis not present

## 2022-10-24 DIAGNOSIS — E785 Hyperlipidemia, unspecified: Secondary | ICD-10-CM | POA: Diagnosis not present

## 2022-10-24 DIAGNOSIS — N184 Chronic kidney disease, stage 4 (severe): Secondary | ICD-10-CM | POA: Diagnosis not present

## 2022-10-24 DIAGNOSIS — I69391 Dysphagia following cerebral infarction: Secondary | ICD-10-CM | POA: Diagnosis not present

## 2022-10-24 DIAGNOSIS — I129 Hypertensive chronic kidney disease with stage 1 through stage 4 chronic kidney disease, or unspecified chronic kidney disease: Secondary | ICD-10-CM | POA: Diagnosis not present

## 2022-10-24 DIAGNOSIS — Z87440 Personal history of urinary (tract) infections: Secondary | ICD-10-CM | POA: Diagnosis not present

## 2022-10-24 DIAGNOSIS — M1990 Unspecified osteoarthritis, unspecified site: Secondary | ICD-10-CM | POA: Diagnosis not present

## 2022-10-24 NOTE — Telephone Encounter (Signed)
Boneta Lucks speech therapist, with [facility] called requesting verbal orders to extend therpay 1 time a week for 3 weeks for speech and swallowing deficits.

## 2022-10-25 ENCOUNTER — Other Ambulatory Visit: Payer: Self-pay | Admitting: Nurse Practitioner

## 2022-10-25 DIAGNOSIS — I69354 Hemiplegia and hemiparesis following cerebral infarction affecting left non-dominant side: Secondary | ICD-10-CM | POA: Diagnosis not present

## 2022-10-25 DIAGNOSIS — M1990 Unspecified osteoarthritis, unspecified site: Secondary | ICD-10-CM | POA: Diagnosis not present

## 2022-10-25 DIAGNOSIS — I69391 Dysphagia following cerebral infarction: Secondary | ICD-10-CM | POA: Diagnosis not present

## 2022-10-25 DIAGNOSIS — E785 Hyperlipidemia, unspecified: Secondary | ICD-10-CM | POA: Diagnosis not present

## 2022-10-25 DIAGNOSIS — N184 Chronic kidney disease, stage 4 (severe): Secondary | ICD-10-CM | POA: Diagnosis not present

## 2022-10-25 DIAGNOSIS — I129 Hypertensive chronic kidney disease with stage 1 through stage 4 chronic kidney disease, or unspecified chronic kidney disease: Secondary | ICD-10-CM | POA: Diagnosis not present

## 2022-10-25 DIAGNOSIS — Z87440 Personal history of urinary (tract) infections: Secondary | ICD-10-CM | POA: Diagnosis not present

## 2022-10-25 DIAGNOSIS — K5900 Constipation, unspecified: Secondary | ICD-10-CM | POA: Diagnosis not present

## 2022-10-25 DIAGNOSIS — E1122 Type 2 diabetes mellitus with diabetic chronic kidney disease: Secondary | ICD-10-CM | POA: Diagnosis not present

## 2022-10-28 ENCOUNTER — Telehealth: Payer: Self-pay | Admitting: Pharmacist

## 2022-10-28 NOTE — Progress Notes (Signed)
Patient appearing on report for quality metrics: controlling blood pressure (CBP).  Outreached patient to discuss medication management. Left voicemail for patient to return my call at their convenience.   Witt Plitt, PharmD, BCPS Clinical Pharmacist [facility]  

## 2022-10-30 DIAGNOSIS — R293 Abnormal posture: Secondary | ICD-10-CM | POA: Diagnosis not present

## 2022-10-30 DIAGNOSIS — M9903 Segmental and somatic dysfunction of lumbar region: Secondary | ICD-10-CM | POA: Diagnosis not present

## 2022-10-30 DIAGNOSIS — M9902 Segmental and somatic dysfunction of thoracic region: Secondary | ICD-10-CM | POA: Diagnosis not present

## 2022-10-30 DIAGNOSIS — M9901 Segmental and somatic dysfunction of cervical region: Secondary | ICD-10-CM | POA: Diagnosis not present

## 2022-10-30 DIAGNOSIS — M5459 Other low back pain: Secondary | ICD-10-CM | POA: Diagnosis not present

## 2022-10-31 DIAGNOSIS — K5900 Constipation, unspecified: Secondary | ICD-10-CM | POA: Diagnosis not present

## 2022-10-31 DIAGNOSIS — M1990 Unspecified osteoarthritis, unspecified site: Secondary | ICD-10-CM | POA: Diagnosis not present

## 2022-10-31 DIAGNOSIS — I129 Hypertensive chronic kidney disease with stage 1 through stage 4 chronic kidney disease, or unspecified chronic kidney disease: Secondary | ICD-10-CM | POA: Diagnosis not present

## 2022-10-31 DIAGNOSIS — E1122 Type 2 diabetes mellitus with diabetic chronic kidney disease: Secondary | ICD-10-CM | POA: Diagnosis not present

## 2022-10-31 DIAGNOSIS — E785 Hyperlipidemia, unspecified: Secondary | ICD-10-CM | POA: Diagnosis not present

## 2022-10-31 DIAGNOSIS — Z87440 Personal history of urinary (tract) infections: Secondary | ICD-10-CM | POA: Diagnosis not present

## 2022-10-31 DIAGNOSIS — I69391 Dysphagia following cerebral infarction: Secondary | ICD-10-CM | POA: Diagnosis not present

## 2022-10-31 DIAGNOSIS — N184 Chronic kidney disease, stage 4 (severe): Secondary | ICD-10-CM | POA: Diagnosis not present

## 2022-10-31 DIAGNOSIS — I69354 Hemiplegia and hemiparesis following cerebral infarction affecting left non-dominant side: Secondary | ICD-10-CM | POA: Diagnosis not present

## 2022-11-01 DIAGNOSIS — M1990 Unspecified osteoarthritis, unspecified site: Secondary | ICD-10-CM | POA: Diagnosis not present

## 2022-11-01 DIAGNOSIS — K5900 Constipation, unspecified: Secondary | ICD-10-CM | POA: Diagnosis not present

## 2022-11-01 DIAGNOSIS — I69391 Dysphagia following cerebral infarction: Secondary | ICD-10-CM | POA: Diagnosis not present

## 2022-11-01 DIAGNOSIS — I129 Hypertensive chronic kidney disease with stage 1 through stage 4 chronic kidney disease, or unspecified chronic kidney disease: Secondary | ICD-10-CM | POA: Diagnosis not present

## 2022-11-01 DIAGNOSIS — E785 Hyperlipidemia, unspecified: Secondary | ICD-10-CM | POA: Diagnosis not present

## 2022-11-01 DIAGNOSIS — E119 Type 2 diabetes mellitus without complications: Secondary | ICD-10-CM | POA: Diagnosis not present

## 2022-11-01 DIAGNOSIS — Z87440 Personal history of urinary (tract) infections: Secondary | ICD-10-CM | POA: Diagnosis not present

## 2022-11-01 DIAGNOSIS — G8194 Hemiplegia, unspecified affecting left nondominant side: Secondary | ICD-10-CM | POA: Diagnosis not present

## 2022-11-01 DIAGNOSIS — N184 Chronic kidney disease, stage 4 (severe): Secondary | ICD-10-CM | POA: Diagnosis not present

## 2022-11-01 DIAGNOSIS — E1122 Type 2 diabetes mellitus with diabetic chronic kidney disease: Secondary | ICD-10-CM | POA: Diagnosis not present

## 2022-11-01 DIAGNOSIS — I639 Cerebral infarction, unspecified: Secondary | ICD-10-CM | POA: Diagnosis not present

## 2022-11-01 DIAGNOSIS — I69354 Hemiplegia and hemiparesis following cerebral infarction affecting left non-dominant side: Secondary | ICD-10-CM | POA: Diagnosis not present

## 2022-11-01 DIAGNOSIS — R1310 Dysphagia, unspecified: Secondary | ICD-10-CM | POA: Diagnosis not present

## 2022-11-04 ENCOUNTER — Encounter: Payer: Self-pay | Admitting: Nurse Practitioner

## 2022-11-06 ENCOUNTER — Encounter: Payer: Self-pay | Admitting: Physical Medicine and Rehabilitation

## 2022-11-06 DIAGNOSIS — N184 Chronic kidney disease, stage 4 (severe): Secondary | ICD-10-CM | POA: Diagnosis not present

## 2022-11-06 DIAGNOSIS — I129 Hypertensive chronic kidney disease with stage 1 through stage 4 chronic kidney disease, or unspecified chronic kidney disease: Secondary | ICD-10-CM | POA: Diagnosis not present

## 2022-11-06 DIAGNOSIS — I69354 Hemiplegia and hemiparesis following cerebral infarction affecting left non-dominant side: Secondary | ICD-10-CM | POA: Diagnosis not present

## 2022-11-06 DIAGNOSIS — E1122 Type 2 diabetes mellitus with diabetic chronic kidney disease: Secondary | ICD-10-CM | POA: Diagnosis not present

## 2022-11-06 DIAGNOSIS — E785 Hyperlipidemia, unspecified: Secondary | ICD-10-CM | POA: Diagnosis not present

## 2022-11-06 DIAGNOSIS — Z87440 Personal history of urinary (tract) infections: Secondary | ICD-10-CM | POA: Diagnosis not present

## 2022-11-06 DIAGNOSIS — K5900 Constipation, unspecified: Secondary | ICD-10-CM | POA: Diagnosis not present

## 2022-11-06 DIAGNOSIS — M1990 Unspecified osteoarthritis, unspecified site: Secondary | ICD-10-CM | POA: Diagnosis not present

## 2022-11-06 DIAGNOSIS — I69391 Dysphagia following cerebral infarction: Secondary | ICD-10-CM | POA: Diagnosis not present

## 2022-11-07 ENCOUNTER — Encounter: Payer: Medicare PPO | Attending: Physical Medicine and Rehabilitation | Admitting: Physical Medicine and Rehabilitation

## 2022-11-07 ENCOUNTER — Encounter: Payer: Self-pay | Admitting: Physical Medicine and Rehabilitation

## 2022-11-07 DIAGNOSIS — R0902 Hypoxemia: Secondary | ICD-10-CM | POA: Diagnosis not present

## 2022-11-07 NOTE — Progress Notes (Signed)
Subjective:    Patient ID: [name], female    DOB: 03-08-1946, 77 y.o.   MRN: [id number]  HPI An audio/video tele-health visit is felt to be the most appropriate encounter for this patient at this time. This is a follow up tele-visit via phone. The patient is at home. MD is at office. Prior to scheduling this appointment, our staff discussed the limitations of evaluation and management by telemedicine and the availability of in-person appointments. The patient expressed understanding and agreed to proceed.   [name] is a 77 year old woman who presents for hospital follow-up after CVA  1) CVA -doing well with pureed diet -has been using Meals for Moms -getting PT and SLP -asks about oxygen therapy  2) Anxiety: -chewing gum helps  3) Drooling -resolved -has not needed scopolamine patch  4) HTN -needs refill of medication  5) Oxygen desaturation -desaturates to 93% -her daughter asks whether she would benefit from oxygen therapy and for how long she would need it for  Pain Inventory Average Pain 2 Pain Right Now 0   BOWEL Number of stools per week: 4    BLADDER Normal    Mobility ability to climb steps?  no do you drive?  no use a wheelchair needs help with transfers Do you have any goals in this area?  yes  Function retired I need assistance with the following:  dressing, bathing, toileting, and meal prep Do you have any goals in this area?  yes  Neuro/Psych trouble walking anxiety  Prior Studies Any changes since last visit?  no  Physicians involved in your care Any changes since last visit?  no   Family History  Problem Relation Age of Onset   Kidney disease Mother    Congestive Heart Failure Father    COPD Father    COPD Brother    Diabetes Maternal Aunt    Social History   Socioeconomic History   Marital status: Legally Separated    Spouse name: Junius Finner   Number of children: 2   Years of education: Highschool and  1 year of collwege in business    Highest education level: Associate degree: academic program  Occupational History   Not on file  Tobacco Use   Smoking status: Never   Smokeless tobacco: Never  Vaping Use   Vaping Use: Never used  Substance and Sexual Activity   Alcohol use: No   Drug use: Never   Sexual activity: Not Currently  Other Topics Concern   Not on file  Social History Narrative   Tobacco use, amount per day now: None/Never Smoked   Past tobacco use, amount per day:   How many years did you use tobacco:   Alcohol use (drinks per week): Never    Diet: Carb modified.   Do you drink/eat things with caffeine: yes sometimes.   Marital status:   Single                               What year were you married?   Do you live in a house, apartment, assisted living, condo, trailer, etc.? House   Is it one or more stories? One   How many persons live in your home? Daughter   Do you have pets in your home?( please list) None.   Highest Level of education completed? Associates Degree   Current or past profession: [employer]  Do you exercise?   No                               Type and how often?   Do you have a living will? Yes   Do you have a DNR form?     No                              If not, do you want to discuss one?   Do you have signed POA/HPOA forms?  Yes                   If so, please bring to you appointment      Do you have any difficulty bathing or dressing yourself? Yes   Do you have any difficulty preparing food or eating? No   Do you have any difficulty managing your medications? No   Do you have any difficulty managing your finances? No   Do you have any difficulty affording your medications? No   Social Determinants of Health   Financial Resource Strain: Medium Risk (11/04/2022)   Overall Financial Resource Strain (CARDIA)    Difficulty of Paying Living Expenses: Somewhat hard  Food Insecurity: No Food Insecurity (11/04/2022)   Hunger Vital  Sign    Worried About Running Out of Food in the Last Year: Never true    Ran Out of Food in the Last Year: Never true  Transportation Needs: No Transportation Needs (11/04/2022)   PRAPARE - Administrator, Civil Service (Medical): No    Lack of Transportation (Non-Medical): No  Physical Activity: Insufficiently Active (11/04/2022)   Exercise Vital Sign    Days of Exercise per Week: 3 days    Minutes of Exercise per Session: 20 min  Stress: No Stress Concern Present (11/04/2022)   [employer] of Occupational Health - Occupational Stress Questionnaire    Feeling of Stress : Only a little  Social Connections: Moderately Integrated (11/04/2022)   Social Connection and Isolation Panel [NHANES]    Frequency of Communication with Friends and Family: Three times a week    Frequency of Social Gatherings with Friends and Family: Once a week    Attends Religious Services: 1 to 4 times per year    Active Member of [employer] or Organizations: Yes    Attends Banker Meetings: 1 to 4 times per year    Marital Status: Separated   Past Surgical History:  Procedure Laterality Date   callous removal  Left 2017   located on 1st digit on L foot 2/2 diabetes   EYE SURGERY     Past Medical History:  Diagnosis Date   Allergy    Arthritis    Broken ankle    Cataract    Diabetes mellitus without complication ([facility])    History of colonoscopy    Hypertension    Macular degeneration syndrome    with edema---being treated.    Stage 4 chronic kidney disease ([facility])    Stroke ([facility]) 2019   There were no vitals taken for this visit.  Opioid Risk Score:   Fall Risk Score:  `1  Depression screen [facility] 2/9     09/23/2022   11:46 AM 09/20/2022    8:29 AM 08/28/2021    3:17 PM 05/28/2021    3:15 PM 12/21/2019    9:55 AM 12/31/2018  10:05 AM 12/15/2018    9:43 AM  Depression screen PHQ 2/9  Decreased Interest 0 0 0 0 0 0 0  Down, Depressed, Hopeless 1 0 0 0 0 0 0  PHQ - 2 Score 1 0  0 0 0 0 0  Altered sleeping 0    0    Tired, decreased energy 1    1    Change in appetite 0    0    Feeling bad or failure about yourself  1    1    Trouble concentrating 1    1    Moving slowly or fidgety/restless 1    0    Suicidal thoughts 0    0    PHQ-9 Score 5    3    Difficult doing work/chores     Somewhat difficult      Review of Systems  Musculoskeletal:  Positive for gait problem.  Psychiatric/Behavioral:         ANXIETY  All other systems reviewed and are negative.      Objective:   Physical Exam Not performed       Assessment & Plan:  1) CVA -continue HEP -would benefit from handicap placard to increase mobility in the community -reviewed all medications and provided necessary refills. -discussed medically tailored meals.  -discussed hyperbaric oxygen therapy -refilled aspirin  2) Drooling -d/c scopolamine patch  3) Type 2 diabetes: -discussed excellent control -continue black current seed oil  4) HTN -refilled cozaar  5) Right ear numbness: -referred to ENT  6) Skin tear -continue manuka honey daily.   7) Oxygen desaturation -O2 prescribed via [facility]  9 minutes spent in discussion of her oxygen desaturations, that we can order O2 for her from [employer] [facility] for when she desats

## 2022-11-08 ENCOUNTER — Encounter: Payer: Self-pay | Admitting: Nurse Practitioner

## 2022-11-08 ENCOUNTER — Ambulatory Visit: Payer: Medicare PPO | Admitting: Nurse Practitioner

## 2022-11-08 VITALS — BP 138/60 | HR 72 | Temp 97.4°F | Resp 16

## 2022-11-08 DIAGNOSIS — E1169 Type 2 diabetes mellitus with other specified complication: Secondary | ICD-10-CM

## 2022-11-08 DIAGNOSIS — I69354 Hemiplegia and hemiparesis following cerebral infarction affecting left non-dominant side: Secondary | ICD-10-CM

## 2022-11-08 DIAGNOSIS — E785 Hyperlipidemia, unspecified: Secondary | ICD-10-CM

## 2022-11-08 DIAGNOSIS — I10 Essential (primary) hypertension: Secondary | ICD-10-CM

## 2022-11-08 DIAGNOSIS — E1122 Type 2 diabetes mellitus with diabetic chronic kidney disease: Secondary | ICD-10-CM

## 2022-11-08 DIAGNOSIS — E559 Vitamin D deficiency, unspecified: Secondary | ICD-10-CM

## 2022-11-08 DIAGNOSIS — N184 Chronic kidney disease, stage 4 (severe): Secondary | ICD-10-CM

## 2022-11-08 DIAGNOSIS — R5381 Other malaise: Secondary | ICD-10-CM

## 2022-11-08 NOTE — Progress Notes (Signed)
Careteam: Patient Care Team: Sharon Seller, NP as PCP - General (Geriatric Medicine) Stephannie Li, MD as Consulting Physician (Ophthalmology)  PLACE OF SERVICE:  [facility]  Advanced Directive information    Allergies  Allergen Reactions   Cephalexin Diarrhea and Nausea And Vomiting   Clonidine Anaphylaxis, Swelling and Other (See Comments)    Tongue swelling   Norvasc [Amlodipine Besylate] Swelling    Edema and TONGUE swelling    Lactose Intolerance (Gi) Diarrhea   Latex Other (See Comments)    Skin blisters   Lisinopril Cough   Morphine And Related Itching and Other (See Comments)    Throat itched and became scratchy   Tape Itching and Other (See Comments)    Some tapes irritate the skin and others don't (redness/itchiness)   Amlodipine Anxiety, Nausea Only and Swelling   Codeine Itching and Rash   Crestor [Rosuvastatin] Rash    Red and flushed in her face    Chief Complaint  Patient presents with   Medical Management of Chronic Issues    Needs foot exam, urine creatinine, due for eye exam in June, I will ask for last year record      HPI: Patient is a 77 y.o. female for routine follow up.  With recent CVA, she has been home from in patient therapy for 2 months.  Eating well, doing pureed diet but able to advance somewhat.  She orders moms meal.  Had 2 episodes that she had vomiting 4-6 hours after she ate.. Also would have reoccurring diarrhea but has had regular bowel movements for the last 4 days.  Overall bowels moving well.   Incontinent of urine since she has been home from hospital.   She had PT coming to the house but was dropped in the floor by the therapist and did not want her coming back. Continues with ST, OT  Had labs 2 months ago   Saw physical med via virtual visit yesterday  Review of Systems:  Review of Systems  Constitutional:  Negative for chills, fever and weight loss.  HENT:  Negative for tinnitus.   Respiratory:  Negative  for cough, sputum production and shortness of breath.   Cardiovascular:  Negative for chest pain, palpitations and leg swelling.  Gastrointestinal:  Negative for abdominal pain, constipation, diarrhea and heartburn.  Genitourinary:  Negative for dysuria, frequency and urgency.  Musculoskeletal:  Negative for back pain, falls, joint pain and myalgias.  Skin: Negative.   Neurological:  Positive for focal weakness and weakness. Negative for dizziness and headaches.  Psychiatric/Behavioral:  Negative for depression and memory loss. The patient does not have insomnia.     Past Medical History:  Diagnosis Date   Allergy    Arthritis    Broken ankle    Cataract    Diabetes mellitus without complication (HCC)    History of colonoscopy    Hypertension    Macular degeneration syndrome    with edema---being treated.    Stage 4 chronic kidney disease (HCC)    Stroke (HCC) 2019   Past Surgical History:  Procedure Laterality Date   callous removal  Left 2017   located on 1st digit on L foot 2/2 diabetes   EYE SURGERY     Social History:   reports that she has never smoked. She has never used smokeless tobacco. She reports that she does not drink alcohol and does not use drugs.  Family History  Problem Relation Age of Onset   Kidney  disease Mother    Congestive Heart Failure Father    COPD Father    COPD Brother    Diabetes Maternal Aunt     Medications: Patient's Medications  New Prescriptions   No medications on file  Previous Medications   ACCU-CHEK GUIDE TEST STRIP       ACETAMINOPHEN (TYLENOL) 325 MG TABLET    Take 2 tablets (650 mg total) by mouth every 4 (four) hours as needed for mild pain (or temp > 37.5 C (99.5 F)).   ASPIRIN 81 MG CHEWABLE TABLET    Chew 1 tablet (81 mg total) by mouth at bedtime.   BLACK CURRANT SEED OIL PO    Take 1 tablet by mouth daily.   LOSARTAN (COZAAR) 25 MG TABLET    Take 1 tablet (25 mg total) by mouth daily.   MELATONIN 3 MG TABS TABLET     Take 1 tablet (3 mg total) by mouth at bedtime as needed.   SENNOSIDES-DOCUSATE SODIUM (SENOKOT-S) 8.6-50 MG TABLET    Take 1 tablet by mouth as needed for constipation.  Modified Medications   No medications on file  Discontinued Medications   No medications on file    Physical Exam:  Vitals:   11/08/22 0934 11/08/22 0936  BP: (!) 140/62 138/60  Pulse: 72   Resp: 16   Temp: (!) 97.4 F (36.3 C)   TempSrc: Temporal   SpO2: 96%    There is no height or weight on file to calculate BMI. Wt Readings from Last 3 Encounters:  08/20/22 142 lb 10.2 oz (64.7 kg)  08/07/22 139 lb 12.4 oz (63.4 kg)  07/30/22 128 lb (58.1 kg)    Physical Exam Constitutional:      General: She is not in acute distress.    Appearance: She is well-developed. She is not diaphoretic.  HENT:     Head: Normocephalic and atraumatic.     Mouth/Throat:     Pharynx: No oropharyngeal exudate.  Eyes:     Conjunctiva/sclera: Conjunctivae normal.     Pupils: Pupils are equal, round, and reactive to light.  Cardiovascular:     Rate and Rhythm: Normal rate and regular rhythm.     Heart sounds: Normal heart sounds.  Pulmonary:     Effort: Pulmonary effort is normal.     Breath sounds: Normal breath sounds.  Abdominal:     General: Bowel sounds are normal.     Palpations: Abdomen is soft.  Musculoskeletal:     Cervical back: Normal range of motion and neck supple.     Right lower leg: No edema.     Left lower leg: No edema.  Skin:    General: Skin is warm and dry.  Neurological:     Mental Status: She is alert and oriented to person, place, and time.     Motor: Weakness present.     Gait: Gait abnormal.  Psychiatric:        Mood and Affect: Mood normal.     Labs reviewed: Basic Metabolic Panel: Recent Labs    02/25/22 0938 07/30/22 1437 08/07/22 0822 08/12/22 0427 08/13/22 0430 08/13/22 1446 08/16/22 1233 08/20/22 0545 08/27/22 0515 09/02/22 0950 09/03/22 0556 09/04/22 0646  NA 140 138    < > 140 138   < > 138   < > 140 138  --  139  K 4.7 3.9   < > 3.7 3.5   < > 3.7   < > 3.8 3.8  --  4.1  CL 107 106   < > 114* 111   < > 105   < > 108 107  --  109  CO2 21 18*   < > 19* 19*   < > 23   < > 20* 21*  --  19*  GLUCOSE 132* 170*   < > 163* 142*   < > 119*   < > 124* 181*  --  113*  BUN 31* 31*   < > 31* 28*   < > 19   < > 37* 43*  --  36*  CREATININE 2.07* 2.07*   < > 2.01* 1.71*   < > 1.79*   < > 1.86* 2.12* 2.10* 2.01*  CALCIUM 9.4 9.3   < > 8.5* 8.1*   < > 8.7*   < > 8.7* 8.5*  --  8.4*  MG  --  1.9   < > 2.3 2.0  --  2.1  --   --   --   --   --   TSH 4.29 4.272  --   --   --   --   --   --   --   --   --   --    < > = values in this interval not displayed.   Liver Function Tests: Recent Labs    08/07/22 0822 08/14/22 0730 08/21/22 1048  AST 20 19 19   ALT 23 20 19   ALKPHOS 114 98 111  BILITOT 0.9 0.6 0.3  PROT 7.0 5.6* 6.0*  ALBUMIN 3.4* 2.8* 3.0*   No results for input(s): "LIPASE", "AMYLASE" in the last 8760 hours. No results for input(s): "AMMONIA" in the last 8760 hours. CBC: Recent Labs    08/14/22 0730 08/19/22 1825 08/28/22 0604 09/02/22 0950 09/03/22 0556  WBC 7.0   < > 6.6 6.2 5.7  NEUTROABS 4.1  --  3.8  --  2.9  HGB 13.0   < > 12.0 11.4* 11.4*  HCT 39.5   < > 35.8* 35.4* 34.8*  MCV 82.0   < > 82.3 84.1 83.5  PLT 266   < > 252 295 270   < > = values in this interval not displayed.   Lipid Panel: Recent Labs    02/25/22 0938 08/08/22 0456 08/26/22 0608  CHOL 214* 159 181  HDL 39* 34* 33*  LDLCALC 141* 100* 121*  TRIG 205* 127 135  CHOLHDL 5.5* 4.7 5.5   TSH: Recent Labs    02/25/22 0938 07/30/22 1437  TSH 4.29 4.272   A1C: Lab Results  Component Value Date   HGBA1C 7.0 (H) 08/26/2022     Assessment/Plan .1. Hemiplegia and hemiparesis following cerebral infarction affecting left non-dominant side (HCC) -stable, continues to progress with strength daily. Continues to work with OT and speech. Encouraged to use continue ST to  help with strength and mobility. Continues on ASA 81 mg daily  2. Chronic kidney disease, stage 4 (severe) (HCC) -Chronic and stable Encourage proper hydration Follow metabolic panel Avoid nephrotoxic meds (NSAIDS)  3. Debility -ongoing, continue therapy  4. Hypocalcemia  Cont to increase calcium in diet as tolerates, unable to use supplement due to dysphaiga  5. Type 2 diabetes mellitus with stage 4 chronic kidney disease, without long-term current use of insulin (HCC) Encouraged dietary compliance, routine foot care/monitoring and to keep up with diabetic eye exams through ophthalmology   6. Hyperlipidemia associated with type 2 diabetes mellitus (HCC) -does not wish to be  on medication, continue dietary modification  7. Essential (primary) hypertension -Blood pressure stable, continue dietary modifications and losartan 25 mg daily   Return in about 4 months (around 03/11/2023) for routine follow up .labs at appt  [place] [facility] Biagio [facility] [phone]

## 2022-11-13 DIAGNOSIS — I69391 Dysphagia following cerebral infarction: Secondary | ICD-10-CM | POA: Diagnosis not present

## 2022-11-13 DIAGNOSIS — I129 Hypertensive chronic kidney disease with stage 1 through stage 4 chronic kidney disease, or unspecified chronic kidney disease: Secondary | ICD-10-CM | POA: Diagnosis not present

## 2022-11-13 DIAGNOSIS — Z87440 Personal history of urinary (tract) infections: Secondary | ICD-10-CM | POA: Diagnosis not present

## 2022-11-13 DIAGNOSIS — N184 Chronic kidney disease, stage 4 (severe): Secondary | ICD-10-CM | POA: Diagnosis not present

## 2022-11-13 DIAGNOSIS — I69354 Hemiplegia and hemiparesis following cerebral infarction affecting left non-dominant side: Secondary | ICD-10-CM | POA: Diagnosis not present

## 2022-11-13 DIAGNOSIS — K5900 Constipation, unspecified: Secondary | ICD-10-CM | POA: Diagnosis not present

## 2022-11-13 DIAGNOSIS — E1122 Type 2 diabetes mellitus with diabetic chronic kidney disease: Secondary | ICD-10-CM | POA: Diagnosis not present

## 2022-11-13 DIAGNOSIS — E785 Hyperlipidemia, unspecified: Secondary | ICD-10-CM | POA: Diagnosis not present

## 2022-11-13 DIAGNOSIS — M1990 Unspecified osteoarthritis, unspecified site: Secondary | ICD-10-CM | POA: Diagnosis not present

## 2022-11-13 NOTE — Addendum Note (Signed)
Addended by: Horton Chin on: 11/13/2022 12:46 PM   Modules accepted: Orders

## 2022-11-14 ENCOUNTER — Telehealth: Payer: Self-pay

## 2022-11-14 DIAGNOSIS — N184 Chronic kidney disease, stage 4 (severe): Secondary | ICD-10-CM | POA: Diagnosis not present

## 2022-11-14 DIAGNOSIS — K5900 Constipation, unspecified: Secondary | ICD-10-CM | POA: Diagnosis not present

## 2022-11-14 DIAGNOSIS — I69354 Hemiplegia and hemiparesis following cerebral infarction affecting left non-dominant side: Secondary | ICD-10-CM | POA: Diagnosis not present

## 2022-11-14 DIAGNOSIS — I69391 Dysphagia following cerebral infarction: Secondary | ICD-10-CM | POA: Diagnosis not present

## 2022-11-14 DIAGNOSIS — Z87440 Personal history of urinary (tract) infections: Secondary | ICD-10-CM | POA: Diagnosis not present

## 2022-11-14 DIAGNOSIS — I129 Hypertensive chronic kidney disease with stage 1 through stage 4 chronic kidney disease, or unspecified chronic kidney disease: Secondary | ICD-10-CM | POA: Diagnosis not present

## 2022-11-14 DIAGNOSIS — M1990 Unspecified osteoarthritis, unspecified site: Secondary | ICD-10-CM | POA: Diagnosis not present

## 2022-11-14 DIAGNOSIS — E785 Hyperlipidemia, unspecified: Secondary | ICD-10-CM | POA: Diagnosis not present

## 2022-11-14 DIAGNOSIS — E1122 Type 2 diabetes mellitus with diabetic chronic kidney disease: Secondary | ICD-10-CM | POA: Diagnosis not present

## 2022-11-14 NOTE — Telephone Encounter (Signed)
Radovan, PT with [facility] called stating that patient is being discharged from OT due to maximum rehab potential being met.  Message sent to Abbey Chatters, NP [facility])

## 2022-11-27 DIAGNOSIS — E113513 Type 2 diabetes mellitus with proliferative diabetic retinopathy with macular edema, bilateral: Secondary | ICD-10-CM | POA: Diagnosis not present

## 2022-11-27 DIAGNOSIS — H4312 Vitreous hemorrhage, left eye: Secondary | ICD-10-CM | POA: Diagnosis not present

## 2022-11-27 DIAGNOSIS — H31011 Macula scars of posterior pole (postinflammatory) (post-traumatic), right eye: Secondary | ICD-10-CM | POA: Diagnosis not present

## 2022-11-27 DIAGNOSIS — H35372 Puckering of macula, left eye: Secondary | ICD-10-CM | POA: Diagnosis not present

## 2022-12-01 DIAGNOSIS — E119 Type 2 diabetes mellitus without complications: Secondary | ICD-10-CM | POA: Diagnosis not present

## 2022-12-01 DIAGNOSIS — G8194 Hemiplegia, unspecified affecting left nondominant side: Secondary | ICD-10-CM | POA: Diagnosis not present

## 2022-12-01 DIAGNOSIS — I639 Cerebral infarction, unspecified: Secondary | ICD-10-CM | POA: Diagnosis not present

## 2022-12-01 DIAGNOSIS — R1310 Dysphagia, unspecified: Secondary | ICD-10-CM | POA: Diagnosis not present

## 2022-12-04 DIAGNOSIS — M5459 Other low back pain: Secondary | ICD-10-CM | POA: Diagnosis not present

## 2022-12-04 DIAGNOSIS — R293 Abnormal posture: Secondary | ICD-10-CM | POA: Diagnosis not present

## 2022-12-04 DIAGNOSIS — M9902 Segmental and somatic dysfunction of thoracic region: Secondary | ICD-10-CM | POA: Diagnosis not present

## 2022-12-04 DIAGNOSIS — M9901 Segmental and somatic dysfunction of cervical region: Secondary | ICD-10-CM | POA: Diagnosis not present

## 2022-12-04 DIAGNOSIS — M9903 Segmental and somatic dysfunction of lumbar region: Secondary | ICD-10-CM | POA: Diagnosis not present

## 2022-12-19 ENCOUNTER — Telehealth: Payer: Self-pay | Admitting: Podiatrist

## 2022-12-19 NOTE — Telephone Encounter (Signed)
Left message for pt that Dr Irving Shows will be in the office tomorrow 6.14 in [place] if she would like to schedule an appt.

## 2022-12-23 ENCOUNTER — Other Ambulatory Visit: Payer: Self-pay | Admitting: Nurse Practitioner

## 2022-12-31 ENCOUNTER — Encounter: Payer: Medicare PPO | Attending: Physical Medicine and Rehabilitation | Admitting: Physical Medicine and Rehabilitation

## 2022-12-31 VITALS — BP 158/80 | HR 71 | Ht 62.0 in | Wt 145.0 lb

## 2022-12-31 DIAGNOSIS — E785 Hyperlipidemia, unspecified: Secondary | ICD-10-CM | POA: Insufficient documentation

## 2022-12-31 DIAGNOSIS — N184 Chronic kidney disease, stage 4 (severe): Secondary | ICD-10-CM | POA: Insufficient documentation

## 2022-12-31 DIAGNOSIS — I639 Cerebral infarction, unspecified: Secondary | ICD-10-CM | POA: Insufficient documentation

## 2022-12-31 DIAGNOSIS — I10 Essential (primary) hypertension: Secondary | ICD-10-CM | POA: Diagnosis not present

## 2022-12-31 DIAGNOSIS — R0989 Other specified symptoms and signs involving the circulatory and respiratory systems: Secondary | ICD-10-CM | POA: Insufficient documentation

## 2022-12-31 DIAGNOSIS — D631 Anemia in chronic kidney disease: Secondary | ICD-10-CM | POA: Insufficient documentation

## 2022-12-31 DIAGNOSIS — Z794 Long term (current) use of insulin: Secondary | ICD-10-CM

## 2022-12-31 DIAGNOSIS — E1149 Type 2 diabetes mellitus with other diabetic neurological complication: Secondary | ICD-10-CM | POA: Diagnosis not present

## 2022-12-31 DIAGNOSIS — R197 Diarrhea, unspecified: Secondary | ICD-10-CM | POA: Insufficient documentation

## 2022-12-31 DIAGNOSIS — H9201 Otalgia, right ear: Secondary | ICD-10-CM | POA: Diagnosis not present

## 2022-12-31 DIAGNOSIS — R0902 Hypoxemia: Secondary | ICD-10-CM | POA: Diagnosis not present

## 2022-12-31 NOTE — Progress Notes (Addendum)
Subjective:    Patient ID: [name], female    DOB: 01/03/1946, 77 y.o.   MRN: [id number]  HPI  [name] is a 77 year old woman who presents for hospital follow-up after CVA  1) CVA -doing well with pureed diet -has been using [employer] -getting PT and SLP -asks about oxygen therapy -had a recent vacation in [place]  2) Anxiety: -chewing gum helps  3) Drooling -resolved -has not needed scopolamine patch  4) HTN -needs refill of medication -BP 149 SBP at home this morning  5) Oxygen desaturation -desaturates to 93% -her daughter asks whether she would benefit from oxygen therapy and for how long she would need it for -these have been good  6) Congestion: -has a lot of congestion when she lays flat  7) Diarrhea: -resolved with probiotic  Pain Inventory Average Pain 4 Pain Right Now 0   BOWEL Number of stools per week: 5    BLADDER Normal    Mobility ability to climb steps?  no do you drive?  no use a wheelchair needs help with transfers Do you have any goals in this area?  yes  Function retired I need assistance with the following:  dressing, bathing, toileting, meal prep, household duties, and shopping Do you have any goals in this area?  no  Neuro/Psych trouble walking anxiety  Prior Studies Any changes since last visit?  no  Physicians involved in your care Any changes since last visit?  no   Family History  Problem Relation Age of Onset   Kidney disease Mother    Congestive Heart Failure Father    COPD Father    COPD Brother    Diabetes Maternal Aunt    Social History   Socioeconomic History   Marital status: Legally Separated    Spouse name: Junius Finner   Number of children: 2   Years of education: Highschool and 1 year of collwege in business    Highest education level: Associate degree: academic program  Occupational History   Not on file  Tobacco Use   Smoking status: Never   Smokeless  tobacco: Never  Vaping Use   Vaping Use: Never used  Substance and Sexual Activity   Alcohol use: No   Drug use: Never   Sexual activity: Not Currently  Other Topics Concern   Not on file  Social History Narrative   Tobacco use, amount per day now: None/Never Smoked   Past tobacco use, amount per day:   How many years did you use tobacco:   Alcohol use (drinks per week): Never    Diet: Carb modified.   Do you drink/eat things with caffeine: yes sometimes.   Marital status:   Single                               What year were you married?   Do you live in a house, apartment, assisted living, condo, trailer, etc.? House   Is it one or more stories? One   How many persons live in your home? Daughter   Do you have pets in your home?( please list) None.   Highest Level of education completed? Associates Degree   Current or past profession: [employer]    Do you exercise?   No  Type and how often?   Do you have a living will? Yes   Do you have a DNR form?     No                              If not, do you want to discuss one?   Do you have signed POA/HPOA forms?  Yes                   If so, please bring to you appointment      Do you have any difficulty bathing or dressing yourself? Yes   Do you have any difficulty preparing food or eating? No   Do you have any difficulty managing your medications? No   Do you have any difficulty managing your finances? No   Do you have any difficulty affording your medications? No   Social Determinants of Health   Financial Resource Strain: Medium Risk (11/04/2022)   Overall Financial Resource Strain (CARDIA)    Difficulty of Paying Living Expenses: Somewhat hard  Food Insecurity: No Food Insecurity (11/04/2022)   Hunger Vital Sign    Worried About Running Out of Food in the Last Year: Never true    Ran Out of Food in the Last Year: Never true  Transportation Needs: No Transportation Needs (11/04/2022)    PRAPARE - Administrator, Civil Service (Medical): No    Lack of Transportation (Non-Medical): No  Physical Activity: Insufficiently Active (11/04/2022)   Exercise Vital Sign    Days of Exercise per Week: 3 days    Minutes of Exercise per Session: 20 min  Stress: No Stress Concern Present (11/04/2022)   [employer] of Occupational Health - Occupational Stress Questionnaire    Feeling of Stress : Only a little  Social Connections: Moderately Integrated (11/04/2022)   Social Connection and Isolation Panel [NHANES]    Frequency of Communication with Friends and Family: Three times a week    Frequency of Social Gatherings with Friends and Family: Once a week    Attends Religious Services: 1 to 4 times per year    Active Member of [employer] or Organizations: Yes    Attends Banker Meetings: 1 to 4 times per year    Marital Status: Separated   Past Surgical History:  Procedure Laterality Date   callous removal  Left 2017   located on 1st digit on L foot 2/2 diabetes   EYE SURGERY     Past Medical History:  Diagnosis Date   Allergy    Arthritis    Broken ankle    Cataract    Diabetes mellitus without complication (HCC)    History of colonoscopy    Hypertension    Macular degeneration syndrome    with edema---being treated.    Stage 4 chronic kidney disease (HCC)    Stroke (HCC) 2019   BP (!) 158/80   Pulse 71   Ht 5\' 2"  (1.575 m)   Wt 145 lb (65.8 kg)   SpO2 93%   BMI 26.52 kg/m   Opioid Risk Score:   Fall Risk Score:  `1  Depression screen [facility] 2/9     09/23/2022   11:46 AM 09/20/2022    8:29 AM 08/28/2021    3:17 PM 05/28/2021    3:15 PM 12/21/2019    9:55 AM 12/31/2018   10:05 AM 12/15/2018    9:43 AM  Depression screen PHQ  2/9  Decreased Interest 0 0 0 0 0 0 0  Down, Depressed, Hopeless 1 0 0 0 0 0 0  PHQ - 2 Score 1 0 0 0 0 0 0  Altered sleeping 0    0    Tired, decreased energy 1    1    Change in appetite 0    0    Feeling bad or  failure about yourself  1    1    Trouble concentrating 1    1    Moving slowly or fidgety/restless 1    0    Suicidal thoughts 0    0    PHQ-9 Score 5    3    Difficult doing work/chores     Somewhat difficult      Review of Systems  Musculoskeletal:  Positive for gait problem.  Psychiatric/Behavioral:         ANXIETY  All other systems reviewed and are negative.      Objective:   Physical Exam Gen: no distress, normal appearing HEENT: oral mucosa pink and moist, NCAT Cardio: Reg rate Chest: normal effort, normal rate of breathing Abd: soft, non-distended Ext: no edema Psych: pleasant, normal affect Skin: intact Neuro: Alert and oriented x3       Assessment & Plan:  1) CVA -continue HEP -would benefit from handicap placard to increase mobility in the community -reviewed all medications and provided necessary refills. -discussed medically tailored meals.  -discussed hyperbaric oxygen therapy -refilled aspirin -labs ordered: vitamin D, CMP, Magnesium, lipid panel -assess spasticity  2) Drooling -d/c scopolamine patch  3) Type 2 diabetes: -discussed excellent control -continue black current seed oil  4) HTN -refilled cozaar  5) Right ear numbness: -referred to ENT -resolved -discussed that friend pulled ear wax out  6) Skin tear -continue manuka honey daily.  -discussed coconut oil   7) Oxygen desaturation -O2 prescribed via [employer] [facility] -discussed that this has has been stable  8) Provided with list of supplements that can help with dyslipidemia: 1) Vitamin B3 500-4,000mg  in divided doses daily (would recommend starting low as can cause uncomfortable facial flushing if started at too high a dose) 2) Phytosterols 2.15 grams daily 3) Fermented soy 30-50 grams daily 4) EGCG (found in green tea): 500-1000mg  daily 5) Omega-3 fatty acids 3000-5,000mg  daily 6) Flax seed 40 grams daily 7) Monounsaturated fats 20-40 grams daily (olives, olive oil, nuts),  also reduces cardiovascular disease 8) Sesame: 40 grams daily 9) Gamma/delta tocotrienols- a family of unsaturated forms of Vitamin E- 200mg  with dinner 10) Pantethine 900mg  daily in divided doses 11) Resveratrol 250mg  daily 12) N Acetyl Cysteine 2000mg  daily in divided doses 13) Curcumin 2000-5000mg  in divided doses daily 14) Pomegranate juice: 8 ounces daily, also helps to lower blood pressure 15) Pomegranate seeds one cup daily, also helps to lower blood pressure 16) Citrus Bergamot 1000mg  daily, also helps with glucose control and weight loss 17) Vitamin C 500mg  daily 18) Quercetin 500-1000mg  daily 19) Glutathione 20) Probiotics 60-100 billion organisms per day 21) Fiber 22) Oats 23) Aged garlic (can eat as food or supplement of 600-900mg  per day) 24) Chia seeds 25 grams per day 25) Lycopene- carotenoid found in high concentrations in tomatoes. 26) Alpha linolenic acid 27) Flavonoids and anthocyanins 28) Wogonin- flavanoid that enhances reverse cholesterol transport 29) Coenzyme Q10 30) Pantethine- derivative of Vitamin B5: 300mg  three times per day or 450mg  twice per day with or without food 31) Barley and other whole  grains 32) Orange juice 33) L- carnitine 34) L- Lysine 35) L- Arginine 36) Almonds 37) Morin 38) Rutin 39) Carnosine 40) Histidine  41) Kaempferol  42) Organosulfur compounds 43) Vitamin E 44) Oleic acid 45) RBO (ferulic acid gammaoryzanol) 46) grape seed extract 47) Red wine 48) Berberine HCL 500mg  daily or twice per day- more effective and with fewer adverse effects that ezetimibe monotherapy 49) red yeast rice 2400- 4800 mg/day 50) chlorella 51) Licorice

## 2022-12-31 NOTE — Patient Instructions (Signed)
Provided with list of supplements that can help with dyslipidemia: 1) Vitamin B3 500-4,000mg in divided doses daily (would recommend starting low as can cause uncomfortable facial flushing if started at too high a dose) 2) Phytosterols 2.15 grams daily 3) Fermented soy 30-50 grams daily 4) EGCG (found in green tea): 500-1000mg daily 5) Omega-3 fatty acids 3000-5,000mg daily 6) Flax seed 40 grams daily 7) Monounsaturated fats 20-40 grams daily (olives, olive oil, nuts), also reduces cardiovascular disease 8) Sesame: 40 grams daily 9) Gamma/delta tocotrienols- a family of unsaturated forms of Vitamin E- 200mg with dinner 10) Pantethine 900mg daily in divided doses 11) Resveratrol 250mg daily 12) N Acetyl Cysteine 2000mg daily in divided doses 13) Curcumin 2000-5000mg in divided doses daily 14) Pomegranate juice: 8 ounces daily, also helps to lower blood pressure 15) Pomegranate seeds one cup daily, also helps to lower blood pressure 16) Citrus Bergamot 1000mg daily, also helps with glucose control and weight loss 17) Vitamin C 500mg daily 18) Quercetin 500-1000mg daily 19) Glutathione 20) Probiotics 60-100 billion organisms per day 21) Fiber 22) Oats 23) Aged garlic (can eat as food or supplement of 600-900mg per day) 24) Chia seeds 25 grams per day 25) Lycopene- carotenoid found in high concentrations in tomatoes. 26) Alpha linolenic acid 27) Flavonoids and anthocyanins 28) Wogonin- flavanoid that enhances reverse cholesterol transport 29) Coenzyme Q10 30) Pantethine- derivative of Vitamin B5: 300mg three times per day or 450mg twice per day with or without food 31) Barley and other whole grains 32) Orange juice 33) L- carnitine 34) L- Lysine 35) L- Arginine 36) Almonds 37) Morin 38) Rutin 39) Carnosine 40) Histidine  41) Kaempferol  42) Organosulfur compounds 43) Vitamin E 44) Oleic acid 45) RBO (ferulic acid gammaoryzanol) 46) grape seed extract 47) Red wine 48)  Berberine HCL 500mg daily or twice per day- more effective and with fewer adverse effects that ezetimibe monotherapy 49) red yeast rice 2400- 4800 mg/day 50) chlorella 51) Licorice  

## 2023-01-01 ENCOUNTER — Encounter: Payer: Self-pay | Admitting: Physical Medicine and Rehabilitation

## 2023-01-01 DIAGNOSIS — I639 Cerebral infarction, unspecified: Secondary | ICD-10-CM | POA: Diagnosis not present

## 2023-01-01 DIAGNOSIS — R1310 Dysphagia, unspecified: Secondary | ICD-10-CM | POA: Diagnosis not present

## 2023-01-01 DIAGNOSIS — M9903 Segmental and somatic dysfunction of lumbar region: Secondary | ICD-10-CM | POA: Diagnosis not present

## 2023-01-01 DIAGNOSIS — G8194 Hemiplegia, unspecified affecting left nondominant side: Secondary | ICD-10-CM | POA: Diagnosis not present

## 2023-01-01 DIAGNOSIS — E119 Type 2 diabetes mellitus without complications: Secondary | ICD-10-CM | POA: Diagnosis not present

## 2023-01-01 DIAGNOSIS — M5459 Other low back pain: Secondary | ICD-10-CM | POA: Diagnosis not present

## 2023-01-01 DIAGNOSIS — R293 Abnormal posture: Secondary | ICD-10-CM | POA: Diagnosis not present

## 2023-01-01 DIAGNOSIS — M9902 Segmental and somatic dysfunction of thoracic region: Secondary | ICD-10-CM | POA: Diagnosis not present

## 2023-01-01 DIAGNOSIS — M9901 Segmental and somatic dysfunction of cervical region: Secondary | ICD-10-CM | POA: Diagnosis not present

## 2023-01-01 LAB — MAGNESIUM: Magnesium: 2.2 mg/dL (ref 1.6–2.3)

## 2023-01-01 LAB — COMPREHENSIVE METABOLIC PANEL
ALT: 17 IU/L (ref 0–32)
AST: 15 IU/L (ref 0–40)
Albumin: 3.8 g/dL (ref 3.8–4.8)
Alkaline Phosphatase: 166 IU/L — ABNORMAL HIGH (ref 44–121)
BUN/Creatinine Ratio: 21 (ref 12–28)
BUN: 43 mg/dL — ABNORMAL HIGH (ref 8–27)
Bilirubin Total: 0.2 mg/dL (ref 0.0–1.2)
CO2: 20 mmol/L (ref 20–29)
Calcium: 8.9 mg/dL (ref 8.7–10.3)
Chloride: 108 mmol/L — ABNORMAL HIGH (ref 96–106)
Creatinine, Ser: 2.06 mg/dL — ABNORMAL HIGH (ref 0.57–1.00)
Globulin, Total: 2.6 g/dL (ref 1.5–4.5)
Glucose: 177 mg/dL — ABNORMAL HIGH (ref 70–99)
Potassium: 4.7 mmol/L (ref 3.5–5.2)
Sodium: 141 mmol/L (ref 134–144)
Total Protein: 6.4 g/dL (ref 6.0–8.5)
eGFR: 24 mL/min/{1.73_m2} — ABNORMAL LOW (ref >59)

## 2023-01-01 LAB — LIPID PANEL W/O CHOL/HDL RATIO
Cholesterol, Total: 169 mg/dL (ref 100–199)
HDL: 38 mg/dL — ABNORMAL LOW (ref >39)
LDL Chol Calc (NIH): 104 mg/dL — ABNORMAL HIGH (ref 0–99)
Triglycerides: 152 mg/dL — ABNORMAL HIGH (ref 0–149)
VLDL Cholesterol Cal: 27 mg/dL (ref 5–40)

## 2023-01-01 LAB — VITAMIN D 25 HYDROXY (VIT D DEFICIENCY, FRACTURES): Vit D, 25-Hydroxy: 36.9 ng/mL (ref 30.0–100.0)

## 2023-01-01 NOTE — Addendum Note (Signed)
Addended by: Horton Chin on: 01/01/2023 12:48 PM   Modules accepted: Orders

## 2023-01-01 NOTE — Progress Notes (Unsigned)
Subjective:    Patient ID: [name], female    DOB: 21-Jun-1946, 77 y.o.   MRN: [id number]  HPI  [name] is a 77 year old woman who presents for hospital follow-up after CVA  1) CVA -doing well with pureed diet -has been using [employer] -getting PT and SLP -asks about oxygen therapy -had a recent vacation in [place]  2) Anxiety: -chewing gum helps  3) Drooling -resolved -has not needed scopolamine patch  4) HTN -needs refill of medication -BP 149 SBP at home this morning  5) Oxygen desaturation -desaturates to 93% -her daughter asks whether she would benefit from oxygen therapy and for how long she would need it for -these have been good  6) Congestion: -has a lot of congestion when she lays flat  7) Diarrhea: -resolved with probiotic  Pain Inventory Average Pain 4 Pain Right Now 0   BOWEL Number of stools per week: 5    BLADDER Normal    Mobility ability to climb steps?  no do you drive?  no use a wheelchair needs help with transfers Do you have any goals in this area?  yes  Function retired I need assistance with the following:  dressing, bathing, toileting, meal prep, household duties, and shopping Do you have any goals in this area?  no  Neuro/Psych trouble walking anxiety  Prior Studies Any changes since last visit?  no  Physicians involved in your care Any changes since last visit?  no   Family History  Problem Relation Age of Onset   Kidney disease Mother    Congestive Heart Failure Father    COPD Father    COPD Brother    Diabetes Maternal Aunt    Social History   Socioeconomic History   Marital status: Legally Separated    Spouse name: Junius Finner   Number of children: 2   Years of education: Highschool and 1 year of collwege in business    Highest education level: Associate degree: academic program  Occupational History   Not on file  Tobacco Use   Smoking status: Never   Smokeless  tobacco: Never  Vaping Use   Vaping Use: Never used  Substance and Sexual Activity   Alcohol use: No   Drug use: Never   Sexual activity: Not Currently  Other Topics Concern   Not on file  Social History Narrative   Tobacco use, amount per day now: None/Never Smoked   Past tobacco use, amount per day:   How many years did you use tobacco:   Alcohol use (drinks per week): Never    Diet: Carb modified.   Do you drink/eat things with caffeine: yes sometimes.   Marital status:   Single                               What year were you married?   Do you live in a house, apartment, assisted living, condo, trailer, etc.? House   Is it one or more stories? One   How many persons live in your home? Daughter   Do you have pets in your home?( please list) None.   Highest Level of education completed? Associates Degree   Current or past profession: [employer]    Do you exercise?   No  Type and how often?   Do you have a living will? Yes   Do you have a DNR form?     No                              If not, do you want to discuss one?   Do you have signed POA/HPOA forms?  Yes                   If so, please bring to you appointment      Do you have any difficulty bathing or dressing yourself? Yes   Do you have any difficulty preparing food or eating? No   Do you have any difficulty managing your medications? No   Do you have any difficulty managing your finances? No   Do you have any difficulty affording your medications? No   Social Determinants of Health   Financial Resource Strain: Medium Risk (11/04/2022)   Overall Financial Resource Strain (CARDIA)    Difficulty of Paying Living Expenses: Somewhat hard  Food Insecurity: No Food Insecurity (11/04/2022)   Hunger Vital Sign    Worried About Running Out of Food in the Last Year: Never true    Ran Out of Food in the Last Year: Never true  Transportation Needs: No Transportation Needs (11/04/2022)    PRAPARE - Administrator, Civil Service (Medical): No    Lack of Transportation (Non-Medical): No  Physical Activity: Insufficiently Active (11/04/2022)   Exercise Vital Sign    Days of Exercise per Week: 3 days    Minutes of Exercise per Session: 20 min  Stress: No Stress Concern Present (11/04/2022)   [employer] of Occupational Health - Occupational Stress Questionnaire    Feeling of Stress : Only a little  Social Connections: Moderately Integrated (11/04/2022)   Social Connection and Isolation Panel [NHANES]    Frequency of Communication with Friends and Family: Three times a week    Frequency of Social Gatherings with Friends and Family: Once a week    Attends Religious Services: 1 to 4 times per year    Active Member of [employer] or Organizations: Yes    Attends Banker Meetings: 1 to 4 times per year    Marital Status: Separated   Past Surgical History:  Procedure Laterality Date   callous removal  Left 2017   located on 1st digit on L foot 2/2 diabetes   EYE SURGERY     Past Medical History:  Diagnosis Date   Allergy    Arthritis    Broken ankle    Cataract    Diabetes mellitus without complication (HCC)    History of colonoscopy    Hypertension    Macular degeneration syndrome    with edema---being treated.    Stage 4 chronic kidney disease (HCC)    Stroke (HCC) 2019   There were no vitals taken for this visit.  Opioid Risk Score:   Fall Risk Score:  `1  Depression screen [facility] 2/9     09/23/2022   11:46 AM 09/20/2022    8:29 AM 08/28/2021    3:17 PM 05/28/2021    3:15 PM 12/21/2019    9:55 AM 12/31/2018   10:05 AM 12/15/2018    9:43 AM  Depression screen PHQ 2/9  Decreased Interest 0 0 0 0 0 0 0  Down, Depressed, Hopeless 1 0 0 0 0 0 0  PHQ - 2 Score 1 0 0 0 0 0 0  Altered sleeping 0    0    Tired, decreased energy 1    1    Change in appetite 0    0    Feeling bad or failure about yourself  1    1    Trouble concentrating 1    1     Moving slowly or fidgety/restless 1    0    Suicidal thoughts 0    0    PHQ-9 Score 5    3    Difficult doing work/chores     Somewhat difficult      Review of Systems  Musculoskeletal:  Positive for gait problem.  Psychiatric/Behavioral:         ANXIETY  All other systems reviewed and are negative.      Objective:   Physical Exam Gen: no distress, normal appearing HEENT: oral mucosa pink and moist, NCAT Cardio: Reg rate Chest: normal effort, normal rate of breathing Abd: soft, non-distended Ext: no edema Psych: pleasant, normal affect Skin: intact Neuro: Alert and oriented x3       Assessment & Plan:  1) CVA -continue HEP -would benefit from handicap placard to increase mobility in the community -reviewed all medications and provided necessary refills. -discussed medically tailored meals.  -discussed hyperbaric oxygen therapy -refilled aspirin -labs ordered: vitamin D, CMP, Magnesium, lipid panel -assess spasticity  2) Drooling -d/c scopolamine patch  3) Type 2 diabetes: -discussed excellent control -continue black current seed oil  4) HTN -refilled cozaar  5) Right ear numbness: -referred to ENT -resolved -discussed that friend pulled ear wax out  6) Skin tear -continue manuka honey daily.  -discussed coconut oil   7) Oxygen desaturation -O2 prescribed via [facility] -discussed that this has has been stable  8) Provided with list of supplements that can help with dyslipidemia: 1) Vitamin B3 500-4,000mg  in divided doses daily (would recommend starting low as can cause uncomfortable facial flushing if started at too high a dose) 2) Phytosterols 2.15 grams daily 3) Fermented soy 30-50 grams daily 4) EGCG (found in green tea): 500-1000mg  daily 5) Omega-3 fatty acids 3000-5,000mg  daily 6) Flax seed 40 grams daily 7) Monounsaturated fats 20-40 grams daily (olives, olive oil, nuts), also reduces cardiovascular disease 8) Sesame: 40 grams  daily 9) Gamma/delta tocotrienols- a family of unsaturated forms of Vitamin E- 200mg  with dinner 10) Pantethine 900mg  daily in divided doses 11) Resveratrol 250mg  daily 12) N Acetyl Cysteine 2000mg  daily in divided doses 13) Curcumin 2000-5000mg  in divided doses daily 14) Pomegranate juice: 8 ounces daily, also helps to lower blood pressure 15) Pomegranate seeds one cup daily, also helps to lower blood pressure 16) Citrus Bergamot 1000mg  daily, also helps with glucose control and weight loss 17) Vitamin C 500mg  daily 18) Quercetin 500-1000mg  daily 19) Glutathione 20) Probiotics 60-100 billion organisms per day 21) Fiber 22) Oats 23) Aged garlic (can eat as food or supplement of 600-900mg  per day) 24) Chia seeds 25 grams per day 25) Lycopene- carotenoid found in high concentrations in tomatoes. 26) Alpha linolenic acid 27) Flavonoids and anthocyanins 28) Wogonin- flavanoid that enhances reverse cholesterol transport 29) Coenzyme Q10 30) Pantethine- derivative of Vitamin B5: 300mg  three times per day or 450mg  twice per day with or without food 31) Barley and other whole grains 32) Orange juice 33) L- carnitine 34) L- Lysine 35) L- Arginine 36) Almonds 37) Morin 38) Rutin 39) Carnosine 40) Histidine  41) Kaempferol  42) Organosulfur compounds 43) Vitamin E 44) Oleic acid 45) RBO (ferulic acid gammaoryzanol) 46) grape seed extract 47) Red wine 48) Berberine HCL 500mg  daily or twice per day- more effective and with fewer adverse effects that ezetimibe monotherapy 49) red yeast rice 2400- 4800 mg/day 50) chlorella 51) Licorice

## 2023-01-02 ENCOUNTER — Encounter (HOSPITAL_BASED_OUTPATIENT_CLINIC_OR_DEPARTMENT_OTHER): Payer: Medicare PPO | Admitting: Physical Medicine and Rehabilitation

## 2023-01-02 DIAGNOSIS — E785 Hyperlipidemia, unspecified: Secondary | ICD-10-CM | POA: Diagnosis not present

## 2023-01-02 DIAGNOSIS — E1149 Type 2 diabetes mellitus with other diabetic neurological complication: Secondary | ICD-10-CM

## 2023-01-02 DIAGNOSIS — N184 Chronic kidney disease, stage 4 (severe): Secondary | ICD-10-CM | POA: Diagnosis not present

## 2023-01-02 DIAGNOSIS — D631 Anemia in chronic kidney disease: Secondary | ICD-10-CM

## 2023-01-02 MED ORDER — VITAMIN D (ERGOCALCIFEROL) 1.25 MG (50000 UNIT) PO CAPS: 50000.0000 [IU] | ORAL_CAPSULE | ORAL | 0 refills | Status: DC

## 2023-01-13 ENCOUNTER — Other Ambulatory Visit: Payer: Self-pay | Admitting: Physical Medicine and Rehabilitation

## 2023-01-13 DIAGNOSIS — D631 Anemia in chronic kidney disease: Secondary | ICD-10-CM

## 2023-01-15 ENCOUNTER — Other Ambulatory Visit: Payer: Self-pay | Admitting: Physical Medicine and Rehabilitation

## 2023-01-15 DIAGNOSIS — N184 Chronic kidney disease, stage 4 (severe): Secondary | ICD-10-CM | POA: Diagnosis not present

## 2023-01-15 DIAGNOSIS — E1149 Type 2 diabetes mellitus with other diabetic neurological complication: Secondary | ICD-10-CM

## 2023-01-15 DIAGNOSIS — D631 Anemia in chronic kidney disease: Secondary | ICD-10-CM | POA: Diagnosis not present

## 2023-01-16 LAB — CBC WITH DIFFERENTIAL/PLATELET
Basophils Absolute: 0.1 10*3/uL (ref 0.0–0.2)
Basos: 1 %
EOS (ABSOLUTE): 0.5 10*3/uL — ABNORMAL HIGH (ref 0.0–0.4)
Eos: 5 %
Hematocrit: 37.3 % (ref 34.0–46.6)
Hemoglobin: 11.7 g/dL (ref 11.1–15.9)
Immature Grans (Abs): 0 10*3/uL (ref 0.0–0.1)
Immature Granulocytes: 0 %
Lymphocytes Absolute: 1.5 10*3/uL (ref 0.7–3.1)
Lymphs: 15 %
MCH: 25.8 pg — ABNORMAL LOW (ref 26.6–33.0)
MCHC: 31.4 g/dL — ABNORMAL LOW (ref 31.5–35.7)
MCV: 82 fL (ref 79–97)
Monocytes Absolute: 0.7 10*3/uL (ref 0.1–0.9)
Monocytes: 7 %
Neutrophils Absolute: 6.9 10*3/uL (ref 1.4–7.0)
Neutrophils: 72 %
Platelets: 307 10*3/uL (ref 150–450)
RBC: 4.53 x10E6/uL (ref 3.77–5.28)
RDW: 14.5 % (ref 11.7–15.4)
WBC: 9.8 10*3/uL (ref 3.4–10.8)

## 2023-01-16 LAB — HEMOGLOBIN A1C
Est. average glucose Bld gHb Est-mCnc: 157 mg/dL
Hgb A1c MFr Bld: 7.1 % — ABNORMAL HIGH (ref 4.8–5.6)

## 2023-01-19 ENCOUNTER — Encounter: Payer: Self-pay | Admitting: Physical Medicine and Rehabilitation

## 2023-01-20 ENCOUNTER — Other Ambulatory Visit: Payer: Self-pay | Admitting: Physical Medicine and Rehabilitation

## 2023-01-23 ENCOUNTER — Encounter: Payer: Medicare PPO | Attending: Physical Medicine and Rehabilitation | Admitting: Physical Medicine and Rehabilitation

## 2023-01-23 DIAGNOSIS — D509 Iron deficiency anemia, unspecified: Secondary | ICD-10-CM | POA: Diagnosis not present

## 2023-01-23 DIAGNOSIS — E1149 Type 2 diabetes mellitus with other diabetic neurological complication: Secondary | ICD-10-CM | POA: Diagnosis not present

## 2023-01-23 NOTE — Progress Notes (Signed)
Subjective:    Patient ID: [name], female    DOB: [birth year], 77 y.o.   MRN: [id number]  HPI An audio/video tele-health visit is felt to be the most appropriate encounter for this patient at this time. This is a follow up tele-visit via phone. The patient is at home. MD is at office. Prior to scheduling this appointment, our staff discussed the limitations of evaluation and management by telemedicine and the availability of in-person appointments. The patient expressed understanding and agreed to proceed.   [name] is a 77 year old woman who presents for hospital follow-up after CVA  1) CVA -doing well with pureed diet -has been using [employer] -getting PT and SLP -asks about oxygen therapy -had a recent vacation in [place]  2) Anxiety: -chewing gum helps  3) Drooling -resolved -has not needed scopolamine patch  4) HTN -needs refill of medication -BP 149 SBP at home this morning  5) Oxygen desaturation -desaturates to 93% -her daughter asks whether she would benefit from oxygen therapy and for how long she would need it for -these have been good  6) Congestion: -has a lot of congestion when she lays flat  7) Diarrhea: -resolved with probiotic  8) HLD -prefers no statin as she had side effects from this in the past  9) Anemia: Discussed recent Hgb  10) CKD Discussed recent creatinine  Pain Inventory Average Pain 4 Pain Right Now 0   BOWEL Number of stools per week: 5    BLADDER Normal    Mobility ability to climb steps?  no do you drive?  no use a wheelchair needs help with transfers Do you have any goals in this area?  yes  Function retired I need assistance with the following:  dressing, bathing, toileting, meal prep, household duties, and shopping Do you have any goals in this area?  no  Neuro/Psych trouble walking anxiety  Prior Studies Any changes since last visit?  no  Physicians involved in your  care Any changes since last visit?  no   Family History  Problem Relation Age of Onset   Kidney disease Mother    Congestive Heart Failure Father    COPD Father    COPD Brother    Diabetes Maternal Aunt    Social History   Socioeconomic History   Marital status: Legally Separated    Spouse name: Junius Finner   Number of children: 2   Years of education: Highschool and 1 year of collwege in business    Highest education level: Associate degree: academic program  Occupational History   Not on file  Tobacco Use   Smoking status: Never   Smokeless tobacco: Never  Vaping Use   Vaping status: Never Used  Substance and Sexual Activity   Alcohol use: No   Drug use: Never   Sexual activity: Not Currently  Other Topics Concern   Not on file  Social History Narrative   Tobacco use, amount per day now: None/Never Smoked   Past tobacco use, amount per day:   How many years did you use tobacco:   Alcohol use (drinks per week): Never    Diet: Carb modified.   Do you drink/eat things with caffeine: yes sometimes.   Marital status:   Single                               What year were you married?  Do you live in a house, apartment, assisted living, condo, trailer, etc.? House   Is it one or more stories? One   How many persons live in your home? Daughter   Do you have pets in your home?( please list) None.   Highest Level of education completed? Associates Degree   Current or past profession: [employer]    Do you exercise?   No                               Type and how often?   Do you have a living will? Yes   Do you have a DNR form?     No                              If not, do you want to discuss one?   Do you have signed POA/HPOA forms?  Yes                   If so, please bring to you appointment      Do you have any difficulty bathing or dressing yourself? Yes   Do you have any difficulty preparing food or eating? No   Do you have any difficulty managing your  medications? No   Do you have any difficulty managing your finances? No   Do you have any difficulty affording your medications? No   Social Determinants of Health   Financial Resource Strain: Medium Risk (11/04/2022)   Overall Financial Resource Strain (CARDIA)    Difficulty of Paying Living Expenses: Somewhat hard  Food Insecurity: No Food Insecurity (11/04/2022)   Hunger Vital Sign    Worried About Running Out of Food in the Last Year: Never true    Ran Out of Food in the Last Year: Never true  Transportation Needs: No Transportation Needs (11/04/2022)   PRAPARE - Administrator, Civil Service (Medical): No    Lack of Transportation (Non-Medical): No  Physical Activity: Insufficiently Active (11/04/2022)   Exercise Vital Sign    Days of Exercise per Week: 3 days    Minutes of Exercise per Session: 20 min  Stress: No Stress Concern Present (11/04/2022)   [employer] of Occupational Health - Occupational Stress Questionnaire    Feeling of Stress : Only a little  Social Connections: Moderately Integrated (11/04/2022)   Social Connection and Isolation Panel [NHANES]    Frequency of Communication with Friends and Family: Three times a week    Frequency of Social Gatherings with Friends and Family: Once a week    Attends Religious Services: 1 to 4 times per year    Active Member of [employer] or Organizations: Yes    Attends Banker Meetings: 1 to 4 times per year    Marital Status: Separated   Past Surgical History:  Procedure Laterality Date   callous removal  Left 2017   located on 1st digit on L foot 2/2 diabetes   EYE SURGERY     Past Medical History:  Diagnosis Date   Allergy    Arthritis    Broken ankle    Cataract    Diabetes mellitus without complication (HCC)    History of colonoscopy    Hypertension    Macular degeneration syndrome    with edema---being treated.    Stage 4 chronic kidney disease (HCC)  Stroke [facility]) 2019   There were no  vitals taken for this visit.  Opioid Risk Score:   Fall Risk Score:  `1  Depression screen [facility] 2/9     09/23/2022   11:46 AM 09/20/2022    8:29 AM 08/28/2021    3:17 PM 05/28/2021    3:15 PM 12/21/2019    9:55 AM 12/31/2018   10:05 AM 12/15/2018    9:43 AM  Depression screen PHQ 2/9  Decreased Interest 0 0 0 0 0 0 0  Down, Depressed, Hopeless 1 0 0 0 0 0 0  PHQ - 2 Score 1 0 0 0 0 0 0  Altered sleeping 0    0    Tired, decreased energy 1    1    Change in appetite 0    0    Feeling bad or failure about yourself  1    1    Trouble concentrating 1    1    Moving slowly or fidgety/restless 1    0    Suicidal thoughts 0    0    PHQ-9 Score 5    3    Difficult doing work/chores     Somewhat difficult      Review of Systems  Musculoskeletal:  Positive for gait problem.  Psychiatric/Behavioral:         ANXIETY  All other systems reviewed and are negative.      Objective:   Physical Exam Gen: no distress, normal appearing HEENT: oral mucosa pink and moist, NCAT Cardio: Reg rate Chest: normal effort, normal rate of breathing Abd: soft, non-distended Ext: no edema Psych: pleasant, normal affect Skin: intact Neuro: Alert and oriented x3     Assessment & Plan:  1) CVA -continue HEP -would benefit from handicap placard to increase mobility in the community -reviewed all medications and provided necessary refills. -discussed medically tailored meals.  -discussed hyperbaric oxygen therapy -refilled aspirin -labs ordered: vitamin D, CMP, Magnesium, lipid panel -assess spasticity  2) Drooling -d/c scopolamine patch  3) Type 2 diabetes: -discussed excellent control -continue black current seed oil -discussed that HgbA1c is 7.1  4) HTN -refilled cozaar  5) Right ear numbness: -referred to ENT -resolved -discussed that friend pulled ear wax out  6) Skin tear -continue manuka honey daily.  -discussed coconut oil   7) Oxygen desaturation -O2 prescribed via [facility] -discussed that this has has been stable  8) HLD: -discussed that triglycerides increased and HDL increased -discussed that LDL is 104 Provided with list of supplements that can help with dyslipidemia: 1) Vitamin B3 500-4,000mg  in divided doses daily (would recommend starting low as can cause uncomfortable facial flushing if started at too high a dose) 2) Phytosterols 2.15 grams daily 3) Fermented soy 30-50 grams daily 4) EGCG (found in green tea): 500-1000mg  daily 5) Omega-3 fatty acids 3000-5,000mg  daily 6) Flax seed 40 grams daily 7) Monounsaturated fats 20-40 grams daily (olives, olive oil, nuts), also reduces cardiovascular disease 8) Sesame: 40 grams daily 9) Gamma/delta tocotrienols- a family of unsaturated forms of Vitamin E- 200mg  with dinner 10) Pantethine 900mg  daily in divided doses 11) Resveratrol 250mg  daily 12) N Acetyl Cysteine 2000mg  daily in divided doses 13) Curcumin 2000-5000mg  in divided doses daily 14) Pomegranate juice: 8 ounces daily, also helps to lower blood pressure 15) Pomegranate seeds one cup daily, also helps to lower blood pressure 16) Citrus Bergamot 1000mg  daily, also helps with glucose control and weight loss 17) Vitamin  C 500mg  daily 18) Quercetin 500-1000mg  daily 19) Glutathione 20) Probiotics 60-100 billion organisms per day 21) Fiber 22) Oats 23) Aged garlic (can eat as food or supplement of 600-900mg  per day) 24) Chia seeds 25 grams per day 25) Lycopene- carotenoid found in high concentrations in tomatoes. 26) Alpha linolenic acid 27) Flavonoids and anthocyanins 28) Wogonin- flavanoid that enhances reverse cholesterol transport 29) Coenzyme Q10 30) Pantethine- derivative of Vitamin B5: 300mg  three times per day or 450mg  twice per day with or without food 31) Barley and other whole grains 32) Orange juice 33) L- carnitine 34) L- Lysine 35) L- Arginine 36) Almonds 37) Morin 38) Rutin 39) Carnosine 40) Histidine  41)  Kaempferol  42) Organosulfur compounds 43) Vitamin E 44) Oleic acid 45) RBO (ferulic acid gammaoryzanol) 46) grape seed extract 47) Red wine 48) Berberine HCL 500mg  daily or twice per day- more effective and with fewer adverse effects that ezetimibe monotherapy 49) red yeast rice 2400- 4800 mg/day 50) chlorella 51) Licorice   9) Anemia:  -Hgb and MCH discussed  -discussed that low MCH could be secondary to iron deficiency and she has a diagnosis of iron deficiency anemia in her chart from the past -discussed iron rich food sources -did not recommend iron supplement due to risk of constipation  10 minutes spent in discussion of her HgbA1c, Hgb, MCH, positive dietary changes, discussed that low MCH could be secondary to iron deficiency anemia and she has a diagnosis of iron deficiency anemia in her chart from the past, discussed iron rich food sources, did not recommend iron supplement due to risk of constipation

## 2023-01-28 ENCOUNTER — Encounter: Payer: Self-pay | Admitting: Podiatry

## 2023-01-28 ENCOUNTER — Ambulatory Visit: Payer: Medicare PPO | Admitting: Podiatry

## 2023-01-28 DIAGNOSIS — E0822 Diabetes mellitus due to underlying condition with diabetic chronic kidney disease: Secondary | ICD-10-CM | POA: Diagnosis not present

## 2023-01-28 DIAGNOSIS — M79674 Pain in right toe(s): Secondary | ICD-10-CM | POA: Diagnosis not present

## 2023-01-28 DIAGNOSIS — M79675 Pain in left toe(s): Secondary | ICD-10-CM | POA: Diagnosis not present

## 2023-01-28 DIAGNOSIS — N184 Chronic kidney disease, stage 4 (severe): Secondary | ICD-10-CM

## 2023-01-28 DIAGNOSIS — B351 Tinea unguium: Secondary | ICD-10-CM | POA: Diagnosis not present

## 2023-01-28 DIAGNOSIS — L97522 Non-pressure chronic ulcer of other part of left foot with fat layer exposed: Secondary | ICD-10-CM

## 2023-01-28 DIAGNOSIS — Z794 Long term (current) use of insulin: Secondary | ICD-10-CM

## 2023-01-28 DIAGNOSIS — I739 Peripheral vascular disease, unspecified: Secondary | ICD-10-CM

## 2023-01-28 MED ORDER — SANTYL 250 UNIT/GM EX OINT: 1.0000 | TOPICAL_OINTMENT | Freq: Every day | CUTANEOUS | 0 refills | Status: AC

## 2023-01-28 NOTE — Progress Notes (Signed)
  Subjective:  Patient ID: [name], female    DOB: 04/03/1946,  MRN: [id number]  Chief Complaint  Patient presents with   Nail Problem    Pt is here for RFC and for a wound on her left great toe, her daughter states she has had it for 2 weeks already and has been wrapping it and putting xeroform on her     77 y.o. female presents with the above complaint. History confirmed with patient.  She is here with her daughter today.  The ulceration has been present for a few weeks.  Has a history of ulcerations that have healed previously.  Her diabetes is well-controlled with an A1c of 7.1%  Objective:  Physical Exam: warm, good capillary refill and some decree sensation, +1 PT pulse, nonpalpable DP, good capillary fill time.  Full-thickness ulceration measuring 2.2 x 1.6 x 0.2 centimeter distal left hallux, largely fibrotic wound bed, no purulence drainage or signs of infection.  Thickened elongated dystrophic nails x 9.     Assessment:   1. Ulcer of great toe, left, with fat layer exposed (HCC)   2. Pain due to onychomycosis of toenails of both feet [B35.1, M79.675, M79.674]   3. Diabetes mellitus due to underlying condition with stage 4 chronic kidney disease, with long-term current use of insulin (HCC)   4. PAD (peripheral artery disease) (HCC)      Plan:  Patient was evaluated and treated and all questions answered.  Discussed the etiology and treatment options for the condition in detail with the patient. Sharp and mechanical debridement performed of all painful and mycotic nails today. Nails debrided in length and thickness using a nail nipper to level of comfort. Discussed treatment options including appropriate shoe gear. Follow up as needed for painful nails.  Ulcer left hallux -We discussed the etiology and factors that are a part of the wound healing process.  We also discussed the risk of infection both soft tissue and osteomyelitis from open ulceration.  Discussed the  risk of limb loss if this happens or worsens. -Debridement as below. -Dressed with antibiotic ointment, DSD. -Continue home dressing changes daily with saline soaked and 4 x 4 gauze and Santyl, Rx sent to pharmacy -At home she does not walk much and is in open shoes or no shoes with socks -Vascular testing ABI and PVR ordered as well as referral to vascular surgery, previous testing done in 2018 showed only mild depressions on the left side -HgbA1c: 7.1% -Last antibiotics: Do not see indication for oral antibiotics at this point   Procedure: Excisional Debridement of Wound Rationale: Removal of non-viable soft tissue from the wound to promote healing.  Anesthesia: none Post-Debridement Wound Measurements: Noted above Type of Debridement: Sharp Excisional Tissue Removed: Non-viable soft tissue Depth of Debridement: subcutaneous tissue. Technique: Sharp excisional debridement to bleeding, viable wound base.  Dressing: Dry, sterile, compression dressing. Disposition: Patient tolerated procedure well.      No follow-ups on file.

## 2023-01-29 DIAGNOSIS — L97522 Non-pressure chronic ulcer of other part of left foot with fat layer exposed: Secondary | ICD-10-CM | POA: Diagnosis not present

## 2023-01-31 DIAGNOSIS — I639 Cerebral infarction, unspecified: Secondary | ICD-10-CM | POA: Diagnosis not present

## 2023-01-31 DIAGNOSIS — R1310 Dysphagia, unspecified: Secondary | ICD-10-CM | POA: Diagnosis not present

## 2023-01-31 DIAGNOSIS — G8194 Hemiplegia, unspecified affecting left nondominant side: Secondary | ICD-10-CM | POA: Diagnosis not present

## 2023-01-31 DIAGNOSIS — E119 Type 2 diabetes mellitus without complications: Secondary | ICD-10-CM | POA: Diagnosis not present

## 2023-02-02 ENCOUNTER — Encounter: Payer: Self-pay | Admitting: Podiatry

## 2023-02-03 MED ORDER — DOXYCYCLINE HYCLATE 100 MG PO TABS: 100.0000 mg | ORAL_TABLET | Freq: Two times a day (BID) | ORAL | 0 refills | Status: DC

## 2023-02-05 ENCOUNTER — Encounter: Payer: Self-pay | Admitting: Podiatry

## 2023-02-05 DIAGNOSIS — M9901 Segmental and somatic dysfunction of cervical region: Secondary | ICD-10-CM | POA: Diagnosis not present

## 2023-02-05 DIAGNOSIS — M5459 Other low back pain: Secondary | ICD-10-CM | POA: Diagnosis not present

## 2023-02-05 DIAGNOSIS — M9902 Segmental and somatic dysfunction of thoracic region: Secondary | ICD-10-CM | POA: Diagnosis not present

## 2023-02-05 DIAGNOSIS — M9903 Segmental and somatic dysfunction of lumbar region: Secondary | ICD-10-CM | POA: Diagnosis not present

## 2023-02-05 DIAGNOSIS — R293 Abnormal posture: Secondary | ICD-10-CM | POA: Diagnosis not present

## 2023-02-18 ENCOUNTER — Ambulatory Visit (INDEPENDENT_AMBULATORY_CARE_PROVIDER_SITE_OTHER): Payer: Medicare PPO | Admitting: Podiatry

## 2023-02-18 DIAGNOSIS — L97522 Non-pressure chronic ulcer of other part of left foot with fat layer exposed: Secondary | ICD-10-CM

## 2023-02-18 NOTE — Patient Instructions (Signed)
Call [phone] to schedule your vascular consultation:  [facility] [address], [place], [place] [address]

## 2023-02-20 ENCOUNTER — Inpatient Hospital Stay: Payer: Medicare PPO

## 2023-02-20 ENCOUNTER — Encounter: Payer: Medicare PPO | Attending: Physician Assistant | Admitting: Physician Assistant

## 2023-02-20 ENCOUNTER — Ambulatory Visit (HOSPITAL_COMMUNITY): Payer: Medicare PPO

## 2023-02-20 ENCOUNTER — Other Ambulatory Visit: Payer: Self-pay

## 2023-02-20 ENCOUNTER — Emergency Department: Payer: Medicare PPO

## 2023-02-20 ENCOUNTER — Inpatient Hospital Stay
Admission: EM | Admit: 2023-02-20 | Discharge: 2023-02-22 | DRG: 300 | Disposition: A | Discharge status: Home / Self Care | Payer: Medicare PPO | Attending: Internal Medicine | Admitting: Internal Medicine

## 2023-02-20 ENCOUNTER — Encounter (HOSPITAL_COMMUNITY): Payer: Self-pay

## 2023-02-20 DIAGNOSIS — Z91048 Other nonmedicinal substance allergy status: Secondary | ICD-10-CM

## 2023-02-20 DIAGNOSIS — Z8673 Personal history of transient ischemic attack (TIA), and cerebral infarction without residual deficits: Secondary | ICD-10-CM | POA: Diagnosis not present

## 2023-02-20 DIAGNOSIS — Z7401 Bed confinement status: Secondary | ICD-10-CM

## 2023-02-20 DIAGNOSIS — I70262 Atherosclerosis of native arteries of extremities with gangrene, left leg: Secondary | ICD-10-CM | POA: Diagnosis not present

## 2023-02-20 DIAGNOSIS — M1990 Unspecified osteoarthritis, unspecified site: Secondary | ICD-10-CM | POA: Diagnosis present

## 2023-02-20 DIAGNOSIS — J849 Interstitial pulmonary disease, unspecified: Secondary | ICD-10-CM | POA: Diagnosis not present

## 2023-02-20 DIAGNOSIS — I129 Hypertensive chronic kidney disease with stage 1 through stage 4 chronic kidney disease, or unspecified chronic kidney disease: Secondary | ICD-10-CM | POA: Diagnosis not present

## 2023-02-20 DIAGNOSIS — L97522 Non-pressure chronic ulcer of other part of left foot with fat layer exposed: Secondary | ICD-10-CM | POA: Diagnosis not present

## 2023-02-20 DIAGNOSIS — I96 Gangrene, not elsewhere classified: Secondary | ICD-10-CM | POA: Diagnosis present

## 2023-02-20 DIAGNOSIS — Z7982 Long term (current) use of aspirin: Secondary | ICD-10-CM

## 2023-02-20 DIAGNOSIS — Z841 Family history of disorders of kidney and ureter: Secondary | ICD-10-CM

## 2023-02-20 DIAGNOSIS — I701 Atherosclerosis of renal artery: Secondary | ICD-10-CM | POA: Diagnosis present

## 2023-02-20 DIAGNOSIS — E1122 Type 2 diabetes mellitus with diabetic chronic kidney disease: Secondary | ICD-10-CM | POA: Diagnosis present

## 2023-02-20 DIAGNOSIS — N184 Chronic kidney disease, stage 4 (severe): Secondary | ICD-10-CM | POA: Diagnosis present

## 2023-02-20 DIAGNOSIS — Z833 Family history of diabetes mellitus: Secondary | ICD-10-CM

## 2023-02-20 DIAGNOSIS — Z91040 Latex allergy status: Secondary | ICD-10-CM

## 2023-02-20 DIAGNOSIS — Z91011 Allergy to milk products: Secondary | ICD-10-CM

## 2023-02-20 DIAGNOSIS — Z79899 Other long term (current) drug therapy: Secondary | ICD-10-CM

## 2023-02-20 DIAGNOSIS — E11621 Type 2 diabetes mellitus with foot ulcer: Secondary | ICD-10-CM | POA: Diagnosis present

## 2023-02-20 DIAGNOSIS — I639 Cerebral infarction, unspecified: Secondary | ICD-10-CM | POA: Diagnosis present

## 2023-02-20 DIAGNOSIS — L97528 Non-pressure chronic ulcer of other part of left foot with other specified severity: Secondary | ICD-10-CM | POA: Diagnosis not present

## 2023-02-20 DIAGNOSIS — Z8249 Family history of ischemic heart disease and other diseases of the circulatory system: Secondary | ICD-10-CM

## 2023-02-20 DIAGNOSIS — E1152 Type 2 diabetes mellitus with diabetic peripheral angiopathy with gangrene: Secondary | ICD-10-CM | POA: Diagnosis not present

## 2023-02-20 DIAGNOSIS — I3481 Nonrheumatic mitral (valve) annulus calcification: Secondary | ICD-10-CM | POA: Diagnosis not present

## 2023-02-20 DIAGNOSIS — Z66 Do not resuscitate: Secondary | ICD-10-CM | POA: Diagnosis not present

## 2023-02-20 DIAGNOSIS — E785 Hyperlipidemia, unspecified: Secondary | ICD-10-CM | POA: Diagnosis not present

## 2023-02-20 DIAGNOSIS — Z881 Allergy status to other antibiotic agents status: Secondary | ICD-10-CM

## 2023-02-20 DIAGNOSIS — R1110 Vomiting, unspecified: Secondary | ICD-10-CM | POA: Diagnosis not present

## 2023-02-20 DIAGNOSIS — I69354 Hemiplegia and hemiparesis following cerebral infarction affecting left non-dominant side: Secondary | ICD-10-CM | POA: Diagnosis not present

## 2023-02-20 DIAGNOSIS — Z5986 Financial insecurity: Secondary | ICD-10-CM

## 2023-02-20 DIAGNOSIS — Z6826 Body mass index (BMI) 26.0-26.9, adult: Secondary | ICD-10-CM

## 2023-02-20 DIAGNOSIS — I69322 Dysarthria following cerebral infarction: Secondary | ICD-10-CM

## 2023-02-20 DIAGNOSIS — R600 Localized edema: Secondary | ICD-10-CM | POA: Diagnosis not present

## 2023-02-20 DIAGNOSIS — E663 Overweight: Secondary | ICD-10-CM | POA: Diagnosis present

## 2023-02-20 DIAGNOSIS — I739 Peripheral vascular disease, unspecified: Secondary | ICD-10-CM | POA: Diagnosis not present

## 2023-02-20 DIAGNOSIS — Z87891 Personal history of nicotine dependence: Secondary | ICD-10-CM | POA: Diagnosis not present

## 2023-02-20 DIAGNOSIS — Z888 Allergy status to other drugs, medicaments and biological substances status: Secondary | ICD-10-CM

## 2023-02-20 DIAGNOSIS — I70245 Atherosclerosis of native arteries of left leg with ulceration of other part of foot: Secondary | ICD-10-CM | POA: Diagnosis not present

## 2023-02-20 DIAGNOSIS — M90572 Osteonecrosis in diseases classified elsewhere, left ankle and foot: Secondary | ICD-10-CM | POA: Diagnosis present

## 2023-02-20 DIAGNOSIS — Z885 Allergy status to narcotic agent status: Secondary | ICD-10-CM

## 2023-02-20 DIAGNOSIS — L97529 Non-pressure chronic ulcer of other part of left foot with unspecified severity: Secondary | ICD-10-CM | POA: Diagnosis not present

## 2023-02-20 DIAGNOSIS — I10 Essential (primary) hypertension: Secondary | ICD-10-CM

## 2023-02-20 DIAGNOSIS — R0989 Other specified symptoms and signs involving the circulatory and respiratory systems: Secondary | ICD-10-CM | POA: Diagnosis not present

## 2023-02-20 DIAGNOSIS — M869 Osteomyelitis, unspecified: Secondary | ICD-10-CM | POA: Diagnosis not present

## 2023-02-20 DIAGNOSIS — E119 Type 2 diabetes mellitus without complications: Secondary | ICD-10-CM

## 2023-02-20 LAB — APTT: aPTT: 33 seconds (ref 24–36)

## 2023-02-20 LAB — COMPREHENSIVE METABOLIC PANEL
ALT: 17 U/L (ref 0–44)
AST: 13 U/L — ABNORMAL LOW (ref 15–41)
Albumin: 2.7 g/dL — ABNORMAL LOW (ref 3.5–5.0)
Alkaline Phosphatase: 129 U/L — ABNORMAL HIGH (ref 38–126)
Anion gap: 7 (ref 5–15)
BUN: 39 mg/dL — ABNORMAL HIGH (ref 8–23)
CO2: 23 mmol/L (ref 22–32)
Calcium: 8.6 mg/dL — ABNORMAL LOW (ref 8.9–10.3)
Chloride: 111 mmol/L (ref 98–111)
Creatinine, Ser: 1.88 mg/dL — ABNORMAL HIGH (ref 0.44–1.00)
GFR, Estimated: 27 mL/min — ABNORMAL LOW (ref >60)
Glucose, Bld: 143 mg/dL — ABNORMAL HIGH (ref 70–99)
Potassium: 3.8 mmol/L (ref 3.5–5.1)
Sodium: 141 mmol/L (ref 135–145)
Total Bilirubin: 0.2 mg/dL — ABNORMAL LOW (ref 0.3–1.2)
Total Protein: 6.9 g/dL (ref 6.5–8.1)

## 2023-02-20 LAB — CBC WITH DIFFERENTIAL/PLATELET
Abs Immature Granulocytes: 0.02 10*3/uL (ref 0.00–0.07)
Basophils Absolute: 0.1 10*3/uL (ref 0.0–0.1)
Basophils Relative: 1 %
Eosinophils Absolute: 0.5 10*3/uL (ref 0.0–0.5)
Eosinophils Relative: 5 %
HCT: 37 % (ref 36.0–46.0)
Hemoglobin: 11.7 g/dL — ABNORMAL LOW (ref 12.0–15.0)
Immature Granulocytes: 0 %
Lymphocytes Relative: 17 %
Lymphs Abs: 1.9 10*3/uL (ref 0.7–4.0)
MCH: 25.8 pg — ABNORMAL LOW (ref 26.0–34.0)
MCHC: 31.6 g/dL (ref 30.0–36.0)
MCV: 81.5 fL (ref 80.0–100.0)
Monocytes Absolute: 0.9 10*3/uL (ref 0.1–1.0)
Monocytes Relative: 8 %
Neutro Abs: 8 10*3/uL — ABNORMAL HIGH (ref 1.7–7.7)
Neutrophils Relative %: 69 %
Platelets: 374 10*3/uL (ref 150–400)
RBC: 4.54 MIL/uL (ref 3.87–5.11)
RDW: 15.2 % (ref 11.5–15.5)
WBC: 11.4 10*3/uL — ABNORMAL HIGH (ref 4.0–10.5)
nRBC: 0 % (ref 0.0–0.2)

## 2023-02-20 LAB — PROTIME-INR
INR: 1.2 (ref 0.8–1.2)
Prothrombin Time: 15.6 seconds — ABNORMAL HIGH (ref 11.4–15.2)

## 2023-02-20 LAB — GLUCOSE, CAPILLARY
Glucose-Capillary: 113 mg/dL — ABNORMAL HIGH (ref 70–99)
Glucose-Capillary: 152 mg/dL — ABNORMAL HIGH (ref 70–99)

## 2023-02-20 LAB — LACTIC ACID, PLASMA: Lactic Acid, Venous: 1.1 mmol/L (ref 0.5–1.9)

## 2023-02-20 MED ORDER — NITROGLYCERIN 2 % TD OINT: TOPICAL_OINTMENT | TRANSDERMAL | 0 refills | Status: DC

## 2023-02-20 MED ORDER — LABETALOL HCL 5 MG/ML IV SOLN: 10.0000 mg | Freq: Once | INTRAVENOUS | Status: DC

## 2023-02-20 MED ORDER — ACETAMINOPHEN 325 MG PO TABS: 650.0000 mg | ORAL_TABLET | Freq: Four times a day (QID) | ORAL | Status: DC | PRN

## 2023-02-20 MED ORDER — ONDANSETRON HCL 4 MG PO TABS: 4.0000 mg | ORAL_TABLET | Freq: Four times a day (QID) | ORAL | Status: DC | PRN

## 2023-02-20 MED ORDER — SODIUM CHLORIDE 0.9 % IV SOLN
2.0000 g | Freq: Once | INTRAVENOUS | Status: AC
  Administered 2023-02-20: 2 g via INTRAVENOUS
  Filled 2023-02-20: qty 12.5

## 2023-02-20 MED ORDER — HEPARIN (PORCINE) 25000 UT/250ML-% IV SOLN: 800.0000 [IU]/h | INTRAVENOUS | Status: DC

## 2023-02-20 MED ORDER — METRONIDAZOLE 500 MG/100ML IV SOLN
500.0000 mg | Freq: Once | INTRAVENOUS | Status: AC
  Administered 2023-02-20: 500 mg via INTRAVENOUS
  Filled 2023-02-20: qty 100

## 2023-02-20 MED ORDER — ONDANSETRON HCL 4 MG/2ML IJ SOLN: 4.0000 mg | Freq: Four times a day (QID) | INTRAMUSCULAR | Status: DC | PRN

## 2023-02-20 MED ORDER — HYDRALAZINE HCL 20 MG/ML IJ SOLN
10.0000 mg | Freq: Once | INTRAMUSCULAR | Status: AC
  Administered 2023-02-20: 10 mg via INTRAVENOUS
  Filled 2023-02-20: qty 1

## 2023-02-20 MED ORDER — INSULIN ASPART 100 UNIT/ML IJ SOLN: 0.0000 [IU] | Freq: Three times a day (TID) | INTRAMUSCULAR | Status: DC

## 2023-02-20 MED ORDER — HYDRALAZINE HCL 20 MG/ML IJ SOLN
10.0000 mg | INTRAMUSCULAR | Status: DC | PRN
  Administered 2023-02-21: 10 mg via INTRAVENOUS
  Filled 2023-02-20: qty 1

## 2023-02-20 MED ORDER — DOXYCYCLINE HYCLATE 100 MG PO TABS: 100.0000 mg | ORAL_TABLET | Freq: Two times a day (BID) | ORAL | 0 refills | Status: DC

## 2023-02-20 MED ORDER — LOSARTAN POTASSIUM 25 MG PO TABS
25.0000 mg | ORAL_TABLET | Freq: Every day | ORAL | Status: DC
  Administered 2023-02-20 – 2023-02-22 (×3): 25 mg via ORAL
  Filled 2023-02-20 (×3): qty 1

## 2023-02-20 MED ORDER — VANCOMYCIN HCL IN DEXTROSE 1-5 GM/200ML-% IV SOLN
1000.0000 mg | Freq: Once | INTRAVENOUS | Status: DC
  Filled 2023-02-20: qty 200

## 2023-02-20 MED ORDER — ENOXAPARIN SODIUM 30 MG/0.3ML IJ SOSY: 30.0000 mg | PREFILLED_SYRINGE | INTRAMUSCULAR | Status: DC

## 2023-02-20 NOTE — Progress Notes (Signed)
Pt daughter at bedside states she has just spoken with RN and it was decided that pt will not receive IV medications as scheduled tonight. At this time daughter has declined placement of additional PIV and requested that placement be postponed until it is needed for IV medications. Pt has 1 PIV in place. Unit RN is aware of this and was advised to place additional consult as needed for placement of 2nd PIV.

## 2023-02-20 NOTE — Consult Note (Signed)
PODIATRY CONSULTATION  NAME [name] MRN [id number] DOB [birth year] DOA 02/20/2023   Reason for consult: Gangrene LT great toe Chief Complaint  Patient presents with   Wound Infection    History of present illness: 77 y.o. female T2DM, multiple comorbidities with recurrent CVA, stage IV CKD admitted for left great toe gangrene.  Patient's daughter present upon evaluation.  Patient has been managed by wound care and podiatry.    ED course: Presented to the ER afebrile, systolic blood pressures 170s to 190s over 60s to 70s. Satting well on room air. White count 11.4, hemoglobin 11.7, platelets 374. Creatinine 1.9. MRI of the foot and ABIs pending   Past Medical History:  Diagnosis Date   Allergy    Arthritis    Broken ankle    Cataract    Diabetes mellitus without complication (HCC)    History of colonoscopy    Hypertension    Macular degeneration syndrome    with edema---being treated.    Stage 4 chronic kidney disease (HCC)    Stroke (HCC) 2019       Latest Ref Rng & Units 02/20/2023   10:03 AM 01/15/2023   10:04 AM 09/03/2022    5:56 AM  CBC  WBC 4.0 - 10.5 K/uL 11.4  9.8  5.7   Hemoglobin 12.0 - 15.0 g/dL 69.6  29.5  28.4   Hematocrit 36.0 - 46.0 % 37.0  37.3  34.8   Platelets 150 - 400 K/uL 374  307  270        Latest Ref Rng & Units 02/20/2023   10:03 AM 12/31/2022   10:11 AM 09/04/2022    6:46 AM  BMP  Glucose 70 - 99 mg/dL 132  440  102   BUN 8 - 23 mg/dL 39  43  36   Creatinine 0.44 - 1.00 mg/dL 7.25  3.66  4.40   BUN/Creat Ratio 12 - 28  21    Sodium 135 - 145 mmol/L 141  141  139   Potassium 3.5 - 5.1 mmol/L 3.8  4.7  4.1   Chloride 98 - 111 mmol/L 111  108  109   CO2 22 - 32 mmol/L 23  20  19    Calcium 8.9 - 10.3 mg/dL 8.6  8.9  8.4     Physical Exam: General: The patient is alert and oriented x3 in no acute distress.   Dermatology: Dry stable gangrene noted to the distal portion of the left great toe.  There is no drainage.  No malodor.   Minimal erythema around the great toe.  Vascular: History of peripheral vascular disease. VAS [place] LOWER EXTREMITY ARTERIAL BILATERAL 07/10/2016 Technologist Notes Biphasic PW waveforms bilaterally, from common femoral to popliteal arteries. Diffuse heterogeneous plaque bilaterally. Elevated velocities in the right mid SFA. Three- vessel run- off on the right and, two- vessel on the left. All calf arteries were difficult to detect via color Doppler. Impressions Diffuse heterogeneous plaque, bilaterally. 50- 74% stenosis right SFA. Three- vessel run- off on the right. Two- vessel run- off on the left. See Arterial Doppler report.  Neurological: Diminished via light touch  Musculoskeletal Exam: Nonambulatory.  No prior amputations.    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  Daughter was present who provided the majority the patient's history -ABIs pending.  Patient will likely need vascular consult -MRI pending -Will plan for partial toe amputation pending results from vascular -In  the meantime recommend Betadine painted to the toe daily -Will follow   Thank you for the consult.  Please contact me directly via secure chat with any questions or concerns.     Felecia Shelling, DPM [facility]  Dr. Felecia Shelling, DPM    [name] [place], [place] [address]                Office [phone]  Fax [phone]

## 2023-02-20 NOTE — Progress Notes (Signed)
PHARMACY -  BRIEF ANTIBIOTIC NOTE   Pharmacy has received consult(s) for Cefepime and vancomycin  from an ED provider.  The patient's profile has been reviewed for ht/wt/allergies/indication/available labs.    One time order(s) placed for Vancomycin 1g and Cefepime 2g   Further antibiotics/pharmacy consults should be ordered by admitting physician if indicated.                       Thank you, Gardner Candle, PharmD, BCPS Clinical Pharmacist 02/20/2023 1:56 PM

## 2023-02-20 NOTE — ED Notes (Signed)
Pt. To MRI

## 2023-02-20 NOTE — Assessment & Plan Note (Addendum)
Statin allergy 

## 2023-02-20 NOTE — Assessment & Plan Note (Addendum)
Seen by podiatry and they would like to watch as outpatient.

## 2023-02-20 NOTE — ED Triage Notes (Signed)
Pt to ED via POV from wound clinic. Pt was seen at wound care center for great left toe infection ongoing x4 weeks and referred to ER due to vascular compromise with dry gangrene.

## 2023-02-20 NOTE — Assessment & Plan Note (Addendum)
Creatinine improved to 1.64 with IV fluids.  Watch closely after angiogram.

## 2023-02-20 NOTE — ED Notes (Signed)
Dr. Newton at bedside. 

## 2023-02-20 NOTE — Assessment & Plan Note (Addendum)
Blood pressure 170s to 190s over 70s to 90s Continue home regimen As needed IV hydralazine Monitor

## 2023-02-20 NOTE — ED Provider Notes (Signed)
[facility] Provider Note    Event Date/Time   First MD Initiated Contact with Patient 02/20/23 1231     (approximate)   History   Wound Infection   HPI  [name] is a 77 y.o. female with history of T2 DM, HTN, prior CVA, CKD presenting to the emergency department for evaluation of left toe infection.  Accompanied by her daughter who provides collateral history.  She reports that over the past 4 weeks, patient has had development of an ulcer over the patient's left great toe with progressive infection.  They have been following with Dr. Lilian Kapur with podiatry.  Today, they were seen for the first time at the [facility] health wound care center.  There, the PA was concerned about dry gangrene with vascular compromise and directed the patient to the ER for further evaluation.  No fevers, nonambulatory at baseline.    Physical Exam   Triage Vital Signs: ED Triage Vitals  Encounter Vitals Group     BP 02/20/23 0959 (!) 179/71     Systolic BP Percentile --      Diastolic BP Percentile --      Pulse Rate 02/20/23 0959 65     Resp 02/20/23 0959 18     Temp 02/20/23 0959 (!) 97.5 F (36.4 C)     Temp Source 02/20/23 0959 Oral     SpO2 02/20/23 0959 98 %     Weight 02/20/23 1000 145 lb (65.8 kg)     Height 02/20/23 1000 5\' 2"  (1.575 m)     Head Circumference --      Peak Flow --      Pain Score 02/20/23 1000 0     Pain Loc --      Pain Education --      Exclude from Growth Chart --     Most recent vital signs: Vitals:   02/20/23 0959  BP: (!) 179/71  Pulse: 65  Resp: 18  Temp: (!) 97.5 F (36.4 C)  SpO2: 98%     General: Awake, interactive  CV:  Regular rate, good peripheral perfusion.  Resp:  Lungs clear, unlabored respirations.  Abd:  Soft, nondistended.  Neuro:  Symmetric facial movement, fluid speech MSK:  There is necrotic tissue over the distal aspect of the left great toe with a small amount of surrounding erythema.  Active drainage.   Daughter has a picture on her phone from 4 weeks ago where ulcer is visible, but the black area has not yet developed, 1+ palpable DP pulses bilaterally, confirmed via Doppler   ED Results / Procedures / Treatments   Labs (all labs ordered are listed, but only abnormal results are displayed) Labs Reviewed  COMPREHENSIVE METABOLIC PANEL - Abnormal; Notable for the following components:      Result Value   Glucose, Bld 143 (*)    BUN 39 (*)    Creatinine, Ser 1.88 (*)    Calcium 8.6 (*)    Albumin 2.7 (*)    AST 13 (*)    Alkaline Phosphatase 129 (*)    Total Bilirubin 0.2 (*)    GFR, Estimated 27 (*)    All other components within normal limits  CBC WITH DIFFERENTIAL/PLATELET - Abnormal; Notable for the following components:   WBC 11.4 (*)    Hemoglobin 11.7 (*)    MCH 25.8 (*)    Neutro Abs 8.0 (*)    All other components within normal limits  CULTURE, BLOOD (ROUTINE  X 2)  CULTURE, BLOOD (ROUTINE X 2)  LACTIC ACID, PLASMA     EKG EKG independently reviewed interpreted by myself (ER attending) demonstrates:    RADIOLOGY Imaging independently reviewed and interpreted by myself demonstrates:    PROCEDURES:  Critical Care performed: No  Procedures   MEDICATIONS ORDERED IN ED: Medications  ceFEPIme (MAXIPIME) 2 g in sodium chloride 0.9 % 100 mL IVPB (has no administration in time range)  metroNIDAZOLE (FLAGYL) IVPB 500 mg (has no administration in time range)  vancomycin (VANCOCIN) IVPB 1000 mg/200 mL premix (has no administration in time range)     IMPRESSION / MDM / ASSESSMENT AND PLAN / ED COURSE  I reviewed the triage vital signs and the nursing notes.  Differential diagnosis includes, but is not limited to, diabetic foot ulcer with developing dry gangrene, consideration for cellulitis, osteomyelitis, soft tissue abscess  Patient's presentation is most consistent with acute presentation with potential threat to life or bodily function.  77 year old female  presenting to the emergency department with left great toe ulcer with evidence of dry gangrene.  Case was reviewed with Dr. Allena Katz with podiatry.  He does recommend admission to hospitalist service with IV antibiotics and MRI which have been ordered.  Will additionally require vascular surgery consultation.  Dr. Logan Bores with podiatry service will evaluate the patient.  Patient and family updated on this plan and are comfortable.  Will reach out to hospitalist team for admission.      FINAL CLINICAL IMPRESSION(S) / ED DIAGNOSES   Final diagnoses:  Dry gangrene (HCC)  Diabetic ulcer of toe of left foot associated with type 2 diabetes mellitus, unspecified ulcer stage (HCC)     Rx / DC Orders   ED Discharge Orders     None        Note:  This document was prepared using Dragon voice recognition software and may include unintentional dictation errors.   Trinna Post, MD 02/20/23 [phone]

## 2023-02-20 NOTE — Assessment & Plan Note (Deleted)
Patient is on diet control.  Previous hemoglobin A1c 7.1.

## 2023-02-20 NOTE — Progress Notes (Signed)
       CROSS COVER NOTE  NAME: [name] MRN: [id number] DOB : 1945-09-11    Concern as stated by nurse / staff    Pt admitted with L Great toe Dry Gangene,HX CVA with L sided weakness,DM,CKD. Pt is bedbound. Has Vascular surgery and Podiatry on consult. Family is at bedside and has refused any additional B/P meds ,also refused Heparin drip and Vanc IV x1 due in ED at 1400. Vanc was refused because pt had already been given 2 other IV antibiotics. Pt is NPO after midnight but has only had sips of water today . Can she have IV fluids thru the night ?    Pertinent findings on chart review:   Assessment and  Interventions      02/20/2023    8:45 PM 02/20/2023    6:06 PM 02/20/2023    5:53 PM  Vitals with BMI  Systolic 181 192 782  Diastolic 56 66 81  Pulse 83 86 86     Plan: Cbg monitoring every 4h while npo LR 75 ML/H        Donnie Mesa NP [facility] Cross Cover 7pm-7am - check amion for availability Pager [phone]

## 2023-02-20 NOTE — Progress Notes (Signed)
Patient arrived to the floor to room 114 pts Bp on arrival was 192/66 (101)  patients daughter would still like to hold off on the IV labetalol.  We will re-evaluate in an hour following shift change. I will notify oncoming nurse.

## 2023-02-20 NOTE — Addendum Note (Signed)
Addended byLilian Kapur,  R on: 02/20/2023 08:11 AM   Modules accepted: Orders

## 2023-02-20 NOTE — Assessment & Plan Note (Addendum)
Baseline history of multiple CVAs with noted difficulty with ambulation, predominant left-sided weakness and speech deficits.

## 2023-02-20 NOTE — Consult Note (Signed)
ANTICOAGULATION CONSULT NOTE - Initial Consult  Pharmacy Consult for Heparin Indication:  limb ischemia  Allergies  Allergen Reactions   Cephalexin Diarrhea and Nausea And Vomiting   Clonidine Anaphylaxis, Swelling and Other (See Comments)    Tongue swelling   Norvasc [Amlodipine Besylate] Swelling    Edema and TONGUE swelling    Lactose Intolerance (Gi) Diarrhea   Latex Other (See Comments)    Skin blisters   Lisinopril Cough   Morphine And Codeine Itching and Other (See Comments)    Throat itched and became scratchy   Tape Itching and Other (See Comments)    Some tapes irritate the skin and others don't (redness/itchiness)   Amlodipine Anxiety, Nausea Only and Swelling   Codeine Itching and Rash   Crestor [Rosuvastatin] Rash    Red and flushed in her face    Patient Measurements: Height: 5\' 2"  (157.5 cm) Weight: 65.8 kg (145 lb) IBW/kg (Calculated) : 50.1 Heparin Dosing Weight: 65.8 kg  Vital Signs: Temp: 97.7 F (36.5 C) (08/15 1806) Temp Source: Oral (08/15 1806) BP: 192/66 (08/15 1806) Pulse Rate: 86 (08/15 1806)  Labs: Recent Labs    02/20/23 1003  HGB 11.7*  HCT 37.0  PLT 374  CREATININE 1.88*    Estimated Creatinine Clearance: 22.3 mL/min (A) (by C-G formula based on SCr of 1.88 mg/dL (H)).   Medical History: Past Medical History:  Diagnosis Date   Allergy    Arthritis    Broken ankle    Cataract    Diabetes mellitus without complication (HCC)    History of colonoscopy    Hypertension    Macular degeneration syndrome    with edema---being treated.    Stage 4 chronic kidney disease (HCC)    Stroke (HCC) 2019    Medications:  Medications Prior to Admission  Medication Sig Dispense Refill Last Dose   ACCU-CHEK GUIDE test strip TEST BLOOD SUGAR TWICE DAILY 200 strip 3    acetaminophen (TYLENOL) 325 MG tablet Take 2 tablets (650 mg total) by mouth every 4 (four) hours as needed for mild pain (or temp > 37.5 C (99.5 F)).      aspirin 81 MG  chewable tablet Chew 1 tablet (81 mg total) by mouth at bedtime. 90 tablet 3    BLACK CURRANT SEED OIL PO Take 1 tablet by mouth daily.      collagenase (SANTYL) 250 UNIT/GM ointment Apply 1 Application topically daily. Apply nickel thick to wound bed and cover with saline moistened wet-to-dry gauze.  Wound measurement 2.2 cm x 1.6 cm 30 g 0    doxycycline (VIBRA-TABS) 100 MG tablet Take 1 tablet (100 mg total) by mouth 2 (two) times daily. 20 tablet 0    losartan (COZAAR) 25 MG tablet Take 1 tablet (25 mg total) by mouth daily. 90 tablet 3    melatonin 3 MG TABS tablet Take 1 tablet (3 mg total) by mouth at bedtime as needed. 30 tablet 0    nitroGLYCERIN (NITROGLYN) 2 % ointment Apply 1g small amount around base of big toe once daily 30 g 0    sennosides-docusate sodium (SENOKOT-S) 8.6-50 MG tablet Take 1 tablet by mouth as needed for constipation.      Vitamin D, Ergocalciferol, (DRISDOL) 1.25 MG (50000 UNIT) CAPS capsule Take 1 capsule (50,000 Units total) by mouth every 7 (seven) days. 7 capsule 0    Scheduled:   insulin aspart  0-9 Units Subcutaneous TID WC   labetalol  10 mg Intravenous Once  losartan  25 mg Oral Daily   Infusions:   vancomycin     PRN: acetaminophen, hydrALAZINE, ondansetron **OR** ondansetron (ZOFRAN) IV Anti-infectives (From admission, onward)    Start     Dose/Rate Route Frequency Ordered Stop   02/20/23 1400  ceFEPIme (MAXIPIME) 2 g in sodium chloride 0.9 % 100 mL IVPB        2 g 200 mL/hr over 30 Minutes Intravenous  Once 02/20/23 1355 02/20/23 1441   02/20/23 1400  metroNIDAZOLE (FLAGYL) IVPB 500 mg        500 mg 100 mL/hr over 60 Minutes Intravenous  Once 02/20/23 1355 02/20/23 1822   02/20/23 1400  vancomycin (VANCOCIN) IVPB 1000 mg/200 mL premix        1,000 mg 200 mL/hr over 60 Minutes Intravenous  Once 02/20/23 1355         Assessment: 77 y.o. female with medical history significant of recurrent CVA, type 2 diabetes, hypertension,  hyperlipidemia, stage IV CKD presenting with left great toe gangrene. No DOAC PTA.   Goal of Therapy:  Heparin level 0.3-0.7 units/ml Monitor platelets by anticoagulation protocol: Yes   Plan:  Start heparin infusion at 800 units/hr Check anti-Xa level in 8 hours and daily while on heparin Continue to monitor H&H and platelets  Ronnald Ramp, PharmD, BCPS 02/20/2023,7:01 PM

## 2023-02-20 NOTE — ED Notes (Signed)
This RN to bedside to give patient medications for BP. This RN gave patient Cozaar. Family requested that patient receive labetalol once she gets to the floor. Family wanted to see if Cozaar would effect the patient and was wanting to discuss her concern for the patient's allergies with the receiving nurse. Receiving nurse and receiving charge nurse notified at this time.

## 2023-02-20 NOTE — H&P (Addendum)
History and Physical    Patient: [name] QIH:[id number] DOB: 24-Nov-1945 DOA: 02/20/2023 DOS: the patient was seen and examined on 02/20/2023 PCP: Sharon Seller, NP  Patient coming from: Home  Chief Complaint:  Chief Complaint  Patient presents with   Wound Infection   HPI: NYSHIA LEAZER is a 77 y.o. 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  History primarily from patient's daughter in the setting of multiple CVAs.  Patient noted to have been followed chronically for left great toe infection with wound care and podiatry.  Noted worsening darkening of the toe over multiple weeks.  Was seen at the [facility] health wound care center today.  On evaluation there was concern for dry gangrene and vascular compromise.  Patient was subsequently redirected to the ER for further evaluation.  No reported trauma to the affected area.  No fevers or chills.  No nausea or vomiting.  Baseline generalized weakness with minimal ambulation in the setting of history of multiple CVAs.  Non-smoker.  No reported alcohol use. Presented to the ER afebrile, systolic blood pressures 170s to 190s over 60s to 70s.  Satting well on room air.  White count 11.4, hemoglobin 11.7, platelets 374.  Creatinine 1.9.  MRI of the foot and ABIs pending.  In discussion with Dr. Rosalia Hammers, Dr. Lilian Kapur with podiatry aware of case. Review of Systems: As mentioned in the history of present illness. All other systems reviewed and are negative. Past Medical History:  Diagnosis Date   Allergy    Arthritis    Broken ankle    Cataract    Diabetes mellitus without complication (HCC)    History of colonoscopy    Hypertension    Macular degeneration syndrome    with edema---being treated.    Stage 4 chronic kidney disease (HCC)    Stroke (HCC) 2019   Past Surgical History:  Procedure Laterality Date   callous removal   Left 2017   located on 1st digit on L foot 2/2 diabetes   EYE SURGERY     Social History:  reports that she has never smoked. She has never used smokeless tobacco. She reports that she does not drink alcohol and does not use drugs.  Allergies  Allergen Reactions   Cephalexin Diarrhea and Nausea And Vomiting   Clonidine Anaphylaxis, Swelling and Other (See Comments)    Tongue swelling   Norvasc [Amlodipine Besylate] Swelling    Edema and TONGUE swelling    Lactose Intolerance (Gi) Diarrhea   Latex Other (See Comments)    Skin blisters   Lisinopril Cough   Morphine And Codeine Itching and Other (See Comments)    Throat itched and became scratchy   Tape Itching and Other (See Comments)    Some tapes irritate the skin and others don't (redness/itchiness)   Amlodipine Anxiety, Nausea Only and Swelling   Codeine Itching and Rash   Crestor [Rosuvastatin] Rash    Red and flushed in her face    Family History  Problem Relation Age of Onset   Kidney disease Mother    Congestive Heart Failure Father    COPD Father    COPD Brother    Diabetes Maternal Aunt     Prior to Admission medications   Medication Sig Start Date End Date Taking? Authorizing Provider  ACCU-CHEK GUIDE test strip TEST BLOOD SUGAR TWICE DAILY 12/23/22   Sharon Seller, NP  acetaminophen (TYLENOL) 325 MG tablet Take 2 tablets (650 mg total) by mouth every 4 (four) hours as needed for mild pain (or temp > 37.5 C (99.5 F)). 09/03/22   Angiulli, Mcarthur Rossetti, PA-C  aspirin 81 MG chewable tablet Chew 1 tablet (81 mg total) by mouth at bedtime. 09/23/22   Raulkar, Drema Pry, MD  BLACK CURRANT SEED OIL PO Take 1 tablet by mouth daily.    [provider]  collagenase (SANTYL) 250 UNIT/GM ointment Apply 1 Application topically daily. Apply nickel thick to wound bed and cover with saline moistened wet-to-dry gauze.  Wound measurement 2.2 cm x 1.6 cm 01/28/23 02/27/23  McDonald, Rachelle Hora, DPM  doxycycline (VIBRA-TABS) 100 MG  tablet Take 1 tablet (100 mg total) by mouth 2 (two) times daily. 02/20/23   McDonald, Rachelle Hora, DPM  losartan (COZAAR) 25 MG tablet Take 1 tablet (25 mg total) by mouth daily. 09/23/22   Raulkar, Drema Pry, MD  melatonin 3 MG TABS tablet Take 1 tablet (3 mg total) by mouth at bedtime as needed. 09/03/22   Angiulli, Mcarthur Rossetti, PA-C  nitroGLYCERIN (NITROGLYN) 2 % ointment Apply 1g small amount around base of big toe once daily 02/20/23   Edwin Cap, DPM  sennosides-docusate sodium (SENOKOT-S) 8.6-50 MG tablet Take 1 tablet by mouth as needed for constipation.    [provider]  Vitamin D, Ergocalciferol, (DRISDOL) 1.25 MG (50000 UNIT) CAPS capsule Take 1 capsule (50,000 Units total) by mouth every 7 (seven) days. 01/02/23   Horton Chin, MD    Physical Exam: Vitals:   02/20/23 1000 02/20/23 1501 02/20/23 1527 02/20/23 1551  BP:  (!) 198/71 (!) 193/70 (!) 174/63  Pulse:  69  74  Resp:  18    Temp:  98.2 F (36.8 C)    TempSrc:  Oral    SpO2:  99%    Weight: 65.8 kg     Height: 5\' 2"  (1.575 m)      Physical Exam Constitutional:      Appearance: She is normal weight.  HENT:     Head: Normocephalic.     Nose: Nose normal.  Eyes:     Pupils: Pupils are equal, round, and reactive to light.  Cardiovascular:     Pulses: Normal pulses.  Pulmonary:     Effort: Pulmonary effort is normal.  Abdominal:     General: Bowel sounds are normal.  Musculoskeletal:     Comments: Positive generalized weakness  Skin:    Comments: See picture  Neurological:     Comments: Positive dysarthria and generalized weakness       Data Reviewed:  There are no new results to review at this time.   Lab Results  Component Value Date   WBC 11.4 (H) 02/20/2023   HGB 11.7 (L) 02/20/2023   HCT 37.0 02/20/2023   MCV 81.5 02/20/2023   PLT 374 02/20/2023   Last metabolic panel Lab Results  Component Value Date   GLUCOSE 143 (H) 02/20/2023   NA 141 02/20/2023   K 3.8 02/20/2023   CL  111 02/20/2023   CO2 23 02/20/2023   BUN 39 (H) 02/20/2023   CREATININE 1.88 (H) 02/20/2023   GFRNONAA 27 (L) 02/20/2023   CALCIUM 8.6 (L) 02/20/2023   PHOS 3.6 02/24/2018   PROT 6.9 02/20/2023   ALBUMIN 2.7 (L) 02/20/2023   LABGLOB 2.6 12/31/2022   BILITOT 0.2 (L) 02/20/2023   ALKPHOS 129 (H) 02/20/2023   AST 13 (L) 02/20/2023  ALT 17 02/20/2023   ANIONGAP 7 02/20/2023    Assessment and Plan: * Toe gangrene (HCC) Worsening left great toe gangrene with concern for ischemia Sent to ER from podiatry clinic by Dr. Lilian Kapur MRI of the foot pending Blood cultures drawn IV cefepime, Flagyl vancomycin for infectious coverage ABIs pending with plan for vascular surgery consultation Pain control Heparin gtt  Follow-up podiatry and vascular surgery recommendations  Stage 4 chronic kidney disease (HCC) Creatinine 1.9 with GFR in the 20s Appears near baseline Monitor renal function closely  CVA (cerebral vascular accident) (HCC) Baseline history of multiple CVAs with noted difficulty with ambulation, predominant left-sided weakness and speech deficits Hold antiplatelet regimen for now pending formal evaluation by vascular surgery and podiatry  HLD (hyperlipidemia) Statin   HTN (hypertension) Blood pressure 170s to 190s over 70s to 90s Continue home regimen As needed IV hydralazine Monitor  Diabetes mellitus without complication (HCC) SSI      Advance Care Planning:   Code Status: DNR   Consults: Podiatry, Vascular Surgery   Family Communication: Daughter at the bedside   Severity of Illness: The appropriate patient status for this patient is INPATIENT. Inpatient status is judged to be reasonable and necessary in order to provide the required intensity of service to ensure the patient's safety. The patient's presenting symptoms, physical exam findings, and initial radiographic and laboratory data in the context of their chronic comorbidities is felt to place them at  high risk for further clinical deterioration. Furthermore, it is not anticipated that the patient will be medically stable for discharge from the hospital within 2 midnights of admission.   * I certify that at the point of admission it is my clinical judgment that the patient will require inpatient hospital care spanning beyond 2 midnights from the point of admission due to high intensity of service, high risk for further deterioration and high frequency of surveillance required.*  Author: Floydene Flock, MD 02/20/2023 4:46 PM  For on call review [email or web address].

## 2023-02-20 NOTE — Progress Notes (Signed)
MD at bedside when attempted admission profile

## 2023-02-20 NOTE — Consult Note (Signed)
Hospital Consult    Reason for Consult:  Left Great toe gangrene  Requesting Physician:  Dr Doree Albee MD MRN #:  [id number]  History of Present Illness: This is a 77 y.o. 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  History primarily from patient's daughter in the setting of multiple CVAs.  Patient noted to have been followed chronically for left great toe infection with wound care and podiatry.  Noted worsening darkening of the toe over multiple weeks.  Was seen at the [facility] wound care center today.  On evaluation there was concern for dry gangrene and vascular compromise.  Patient was subsequently redirected to the ER for further evaluation.   On exam the patient was resting comfortably in bed. Daughter is at the bedside and is her caretaker. Patient noted to have gangrene on her left great toe. Bilateral lower extremities are warm to touch. Patient denies any resting pain to her toes. Per daughter the patient post CVA does not ambulate but very little. Currently patient denies any chest pain, shortness of breath, dizziness, N/V/D. Vitals all remain stable. Patient was taking ASA 81 mg and Plavix 75 mg daily per daughter post CVA.   I had a long discussion about the use of heparin. Daughter did not want any use of heparin due to high risk of bleeding and did not allow nursing to start a heparin infusion last evening. She was informed heparin will need to be used in order to complete the angiogram or it could not be done. We discussed all the risks and she then consented to the use of Heparin.   We also discussed the use of IV contrast dye. Daughter did not want her mother to have Iv contrast Dye as well. I informed her we could not complete the angiogram without the use of contrast dye. Once I explained the use she consented to the use of IV contrast dye. We also discussed her  mothers chronic kidney disease and how the use of IV contrast dye can require Hemodialysis afterwards if kidney failure worsens. I informed patient and daughter we would use the least amount of IV Contrast Dye we could to complete the angiogram.  She verbalized her understanding and wishes to proceed.   Past Medical History:  Diagnosis Date   Allergy    Arthritis    Broken ankle    Cataract    Diabetes mellitus without complication (HCC)    History of colonoscopy    Hypertension    Macular degeneration syndrome    with edema---being treated.    Stage 4 chronic kidney disease (HCC)    Stroke (HCC) 2019    Past Surgical History:  Procedure Laterality Date   callous removal  Left 2017   located on 1st digit on L foot 2/2 diabetes   EYE SURGERY      Allergies  Allergen Reactions   Cephalexin Diarrhea and Nausea And Vomiting   Clonidine Anaphylaxis, Swelling and Other (See Comments)    Tongue swelling   Norvasc [Amlodipine Besylate] Swelling    Edema and TONGUE swelling    Lactose Intolerance (Gi) Diarrhea   Latex Other (See Comments)    Skin blisters   Lisinopril Cough   Morphine And Codeine Itching and Other (See Comments)    Throat itched and became scratchy   Tape Itching and Other (See Comments)    Some tapes irritate the skin  and others don't (redness/itchiness)   Amlodipine Anxiety, Nausea Only and Swelling   Codeine Itching and Rash   Crestor [Rosuvastatin] Rash    Red and flushed in her face    Prior to Admission medications   Medication Sig Start Date End Date Taking? Authorizing Provider  ACCU-CHEK GUIDE test strip TEST BLOOD SUGAR TWICE DAILY 12/23/22   Sharon Seller, NP  acetaminophen (TYLENOL) 325 MG tablet Take 2 tablets (650 mg total) by mouth every 4 (four) hours as needed for mild pain (or temp > 37.5 C (99.5 F)). 09/03/22   Angiulli, Mcarthur Rossetti, PA-C  aspirin 81 MG chewable tablet Chew 1 tablet (81 mg total) by mouth at bedtime. 09/23/22   Raulkar,  Drema Pry, MD  BLACK CURRANT SEED OIL PO Take 1 tablet by mouth daily.    [provider]  collagenase (SANTYL) 250 UNIT/GM ointment Apply 1 Application topically daily. Apply nickel thick to wound bed and cover with saline moistened wet-to-dry gauze.  Wound measurement 2.2 cm x 1.6 cm 01/28/23 02/27/23  McDonald, Rachelle Hora, DPM  doxycycline (VIBRA-TABS) 100 MG tablet Take 1 tablet (100 mg total) by mouth 2 (two) times daily. 02/20/23   McDonald, Rachelle Hora, DPM  losartan (COZAAR) 25 MG tablet Take 1 tablet (25 mg total) by mouth daily. 09/23/22   Raulkar, Drema Pry, MD  melatonin 3 MG TABS tablet Take 1 tablet (3 mg total) by mouth at bedtime as needed. 09/03/22   Angiulli, Mcarthur Rossetti, PA-C  nitroGLYCERIN (NITROGLYN) 2 % ointment Apply 1g small amount around base of big toe once daily 02/20/23   Edwin Cap, DPM  sennosides-docusate sodium (SENOKOT-S) 8.6-50 MG tablet Take 1 tablet by mouth as needed for constipation.    [provider]  Vitamin D, Ergocalciferol, (DRISDOL) 1.25 MG (50000 UNIT) CAPS capsule Take 1 capsule (50,000 Units total) by mouth every 7 (seven) days. 01/02/23   Raulkar, Drema Pry, MD    Social History   Socioeconomic History   Marital status: Legally Separated    Spouse name: Junius Finner   Number of children: 2   Years of education: Highschool and 1 year of collwege in business    Highest education level: Associate degree: academic program  Occupational History   Not on file  Tobacco Use   Smoking status: Never   Smokeless tobacco: Never  Vaping Use   Vaping status: Never Used  Substance and Sexual Activity   Alcohol use: No   Drug use: Never   Sexual activity: Not Currently  Other Topics Concern   Not on file  Social History Narrative   Tobacco use, amount per day now: None/Never Smoked   Past tobacco use, amount per day:   How many years did you use tobacco:   Alcohol use (drinks per week): Never    Diet: Carb modified.   Do you drink/eat  things with caffeine: yes sometimes.   Marital status:   Single                               What year were you married?   Do you live in a house, apartment, assisted living, condo, trailer, etc.? House   Is it one or more stories? One   How many persons live in your home? Daughter   Do you have pets in your home?( please list) None.   Highest Level of education completed? Associates Degree   Current  or past profession: [employer]    Do you exercise?   No                               Type and how often?   Do you have a living will? Yes   Do you have a DNR form?     No                              If not, do you want to discuss one?   Do you have signed POA/HPOA forms?  Yes                   If so, please bring to you appointment      Do you have any difficulty bathing or dressing yourself? Yes   Do you have any difficulty preparing food or eating? No   Do you have any difficulty managing your medications? No   Do you have any difficulty managing your finances? No   Do you have any difficulty affording your medications? No   Social Determinants of Health   Financial Resource Strain: Medium Risk (11/04/2022)   Overall Financial Resource Strain (CARDIA)    Difficulty of Paying Living Expenses: Somewhat hard  Food Insecurity: No Food Insecurity (02/20/2023)   Hunger Vital Sign    Worried About Running Out of Food in the Last Year: Never true    Ran Out of Food in the Last Year: Never true  Transportation Needs: No Transportation Needs (02/20/2023)   PRAPARE - Administrator, Civil Service (Medical): No    Lack of Transportation (Non-Medical): No  Physical Activity: Insufficiently Active (11/04/2022)   Exercise Vital Sign    Days of Exercise per Week: 3 days    Minutes of Exercise per Session: 20 min  Stress: No Stress Concern Present (11/04/2022)   [employer] of Occupational Health - Occupational Stress Questionnaire    Feeling of Stress : Only a little   Social Connections: Moderately Integrated (11/04/2022)   Social Connection and Isolation Panel [NHANES]    Frequency of Communication with Friends and Family: Three times a week    Frequency of Social Gatherings with Friends and Family: Once a week    Attends Religious Services: 1 to 4 times per year    Active Member of [employer] or Organizations: Yes    Attends Banker Meetings: 1 to 4 times per year    Marital Status: Separated  Intimate Partner Violence: Not At Risk (02/20/2023)   Humiliation, Afraid, Rape, and Kick questionnaire    Fear of Current or Ex-Partner: No    Emotionally Abused: No    Physically Abused: No    Sexually Abused: No     Family History  Problem Relation Age of Onset   Kidney disease Mother    Congestive Heart Failure Father    COPD Father    COPD Brother    Diabetes Maternal Aunt     ROS: Otherwise negative unless mentioned in HPI  Physical Examination  Vitals:   02/20/23 1753 02/20/23 1806  BP: (!) 207/81 (!) 192/66  Pulse: 86 86  Resp: 18 18  Temp: 97.7 F (36.5 C) 97.7 F (36.5 C)  SpO2: 99% 100%   Body mass index is 26.52 kg/m.  General:  WDWN in NAD Gait: Not observed HENT: WNL, normocephalic Pulmonary:  normal non-labored breathing, without Rales, rhonchi,  wheezing Cardiac: regular, without  Murmurs, rubs or gallops; without carotid bruits Abdomen: Positive bowel Sounds throughout, soft, NT/ND, no masses Skin: without rashes Vascular Exam/Pulses: Bilateral lower extremities are warm to touch and have weak PT/DP pulses. Gangrene noted to left great toe.  Extremities: with ischemic changes, with Gangrene , with cellulitis; without open wounds;  Musculoskeletal: no muscle wasting or atrophy  Neurologic: A&O X 3;  No focal weakness or paresthesias are detected; speech is fluent/normal Psychiatric:  The pt has Abnormal- Hx CVA from 2019. Flat  affect. Lymph:  Unremarkable  CBC    Component Value Date/Time   WBC 11.4 (H)  02/20/2023 1003   RBC 4.54 02/20/2023 1003   HGB 11.7 (L) 02/20/2023 1003   HGB 11.7 01/15/2023 1004   HCT 37.0 02/20/2023 1003   HCT 37.3 01/15/2023 1004   PLT 374 02/20/2023 1003   PLT 307 01/15/2023 1004   MCV 81.5 02/20/2023 1003   MCV 82 01/15/2023 1004   MCH 25.8 (L) 02/20/2023 1003   MCHC 31.6 02/20/2023 1003   RDW 15.2 02/20/2023 1003   RDW 14.5 01/15/2023 1004   LYMPHSABS 1.9 02/20/2023 1003   LYMPHSABS 1.5 01/15/2023 1004   MONOABS 0.9 02/20/2023 1003   EOSABS 0.5 02/20/2023 1003   EOSABS 0.5 (H) 01/15/2023 1004   BASOSABS 0.1 02/20/2023 1003   BASOSABS 0.1 01/15/2023 1004    BMET    Component Value Date/Time   NA 141 02/20/2023 1003   NA 141 12/31/2022 1011   K 3.8 02/20/2023 1003   CL 111 02/20/2023 1003   CO2 23 02/20/2023 1003   GLUCOSE 143 (H) 02/20/2023 1003   BUN 39 (H) 02/20/2023 1003   BUN 43 (H) 12/31/2022 1011   CREATININE 1.88 (H) 02/20/2023 1003   CREATININE 2.07 (H) 02/25/2022 0938   CALCIUM 8.6 (L) 02/20/2023 1003   GFRNONAA 27 (L) 02/20/2023 1003   GFRNONAA 27 (L) 08/18/2020 1153   GFRAA 32 (L) 08/18/2020 1153    COAGS: Lab Results  Component Value Date   INR 1.2 02/20/2023   INR 0.96 02/24/2018     Non-Invasive Vascular Imaging:   MRI of Left foot Pending Ultrasounds with ABI's Pending  Statin:  No. Beta Blocker:  No. Aspirin:  Yes.   ACEI:  No. ARB:  Yes.   CCB use:  No Other antiplatelets/anticoagulants:  No.    ASSESSMENT/PLAN: This is a 77 y.o. female who presets to [facility] emergency department after being seen by [facility] wound care center for concerns of vascular compromise to her left lower extremity.   Plan:  Vascular Surgery plans on taking the patient to the vascular lab for a left lower extremity angiogram with possible intervention. I discussed in detail with the patient and her daughter the procedure, benefits, risks and complications. Patient and daughter verbalize there understanding and wish to proceed as  soon as possible. I answered all their questions. Patient will be made NPO after midnight.  - I discussed the plan in detail with Dr Vilinda Flake and he agrees with the plan. Patients current bun = 41 with creatinine of 1.64 after being on IVF overnight.    Marcie [facility] 02/20/2023 7:54 PM

## 2023-02-21 ENCOUNTER — Encounter
Admission: EM | Disposition: A | Discharge status: Home / Self Care | Payer: Self-pay | Source: Home / Self Care | Attending: Internal Medicine

## 2023-02-21 DIAGNOSIS — E785 Hyperlipidemia, unspecified: Secondary | ICD-10-CM | POA: Diagnosis not present

## 2023-02-21 DIAGNOSIS — E1122 Type 2 diabetes mellitus with diabetic chronic kidney disease: Secondary | ICD-10-CM

## 2023-02-21 DIAGNOSIS — I10 Essential (primary) hypertension: Secondary | ICD-10-CM | POA: Diagnosis not present

## 2023-02-21 DIAGNOSIS — I701 Atherosclerosis of renal artery: Secondary | ICD-10-CM

## 2023-02-21 DIAGNOSIS — N184 Chronic kidney disease, stage 4 (severe): Secondary | ICD-10-CM | POA: Diagnosis not present

## 2023-02-21 DIAGNOSIS — M869 Osteomyelitis, unspecified: Secondary | ICD-10-CM

## 2023-02-21 DIAGNOSIS — R0989 Other specified symptoms and signs involving the circulatory and respiratory systems: Secondary | ICD-10-CM

## 2023-02-21 DIAGNOSIS — I70262 Atherosclerosis of native arteries of extremities with gangrene, left leg: Secondary | ICD-10-CM

## 2023-02-21 DIAGNOSIS — E11621 Type 2 diabetes mellitus with foot ulcer: Secondary | ICD-10-CM

## 2023-02-21 HISTORY — PX: LOWER EXTREMITY ANGIOGRAPHY: CATH118251

## 2023-02-21 LAB — CBC
HCT: 33.8 % — ABNORMAL LOW (ref 36.0–46.0)
Hemoglobin: 11 g/dL — ABNORMAL LOW (ref 12.0–15.0)
MCH: 25.6 pg — ABNORMAL LOW (ref 26.0–34.0)
MCHC: 32.5 g/dL (ref 30.0–36.0)
MCV: 78.8 fL — ABNORMAL LOW (ref 80.0–100.0)
Platelets: 347 10*3/uL (ref 150–400)
RBC: 4.29 MIL/uL (ref 3.87–5.11)
RDW: 15.2 % (ref 11.5–15.5)
WBC: 10.4 10*3/uL (ref 4.0–10.5)
nRBC: 0 % (ref 0.0–0.2)

## 2023-02-21 LAB — GLUCOSE, CAPILLARY
Glucose-Capillary: 107 mg/dL — ABNORMAL HIGH (ref 70–99)
Glucose-Capillary: 129 mg/dL — ABNORMAL HIGH (ref 70–99)
Glucose-Capillary: 134 mg/dL — ABNORMAL HIGH (ref 70–99)
Glucose-Capillary: 162 mg/dL — ABNORMAL HIGH (ref 70–99)
Glucose-Capillary: 206 mg/dL — ABNORMAL HIGH (ref 70–99)
Glucose-Capillary: 85 mg/dL (ref 70–99)
Glucose-Capillary: 88 mg/dL (ref 70–99)
Glucose-Capillary: 90 mg/dL (ref 70–99)

## 2023-02-21 LAB — HEPARIN LEVEL (UNFRACTIONATED): Heparin Unfractionated: <0.1 [IU]/mL — ABNORMAL LOW (ref 0.30–0.70)

## 2023-02-21 LAB — COMPREHENSIVE METABOLIC PANEL
ALT: 14 U/L (ref 0–44)
AST: 11 U/L — ABNORMAL LOW (ref 15–41)
Albumin: 2.8 g/dL — ABNORMAL LOW (ref 3.5–5.0)
Alkaline Phosphatase: 109 U/L (ref 38–126)
Anion gap: 9 (ref 5–15)
BUN: 41 mg/dL — ABNORMAL HIGH (ref 8–23)
CO2: 19 mmol/L — ABNORMAL LOW (ref 22–32)
Calcium: 8.5 mg/dL — ABNORMAL LOW (ref 8.9–10.3)
Chloride: 113 mmol/L — ABNORMAL HIGH (ref 98–111)
Creatinine, Ser: 1.64 mg/dL — ABNORMAL HIGH (ref 0.44–1.00)
GFR, Estimated: 32 mL/min — ABNORMAL LOW (ref >60)
Glucose, Bld: 105 mg/dL — ABNORMAL HIGH (ref 70–99)
Potassium: 4 mmol/L (ref 3.5–5.1)
Sodium: 141 mmol/L (ref 135–145)
Total Bilirubin: 0.7 mg/dL (ref 0.3–1.2)
Total Protein: 6.1 g/dL — ABNORMAL LOW (ref 6.5–8.1)

## 2023-02-21 SURGERY — LOWER EXTREMITY ANGIOGRAPHY
Anesthesia: Moderate Sedation | Laterality: Left

## 2023-02-21 MED ORDER — METHYLPREDNISOLONE SODIUM SUCC 125 MG IJ SOLR: 125.0000 mg | Freq: Once | INTRAMUSCULAR | Status: DC | PRN

## 2023-02-21 MED ORDER — FAMOTIDINE 20 MG PO TABS: 40.0000 mg | ORAL_TABLET | Freq: Once | ORAL | Status: DC | PRN

## 2023-02-21 MED ORDER — INSULIN ASPART 100 UNIT/ML IJ SOLN: 0.0000 [IU] | INTRAMUSCULAR | Status: DC

## 2023-02-21 MED ORDER — MIDAZOLAM HCL 2 MG/2ML IJ SOLN
INTRAMUSCULAR | Status: DC | PRN
  Administered 2023-02-21: 1 mg via INTRAVENOUS
  Administered 2023-02-21: .5 mg via INTRAVENOUS

## 2023-02-21 MED ORDER — MIDAZOLAM HCL 5 MG/5ML IJ SOLN
INTRAMUSCULAR | Status: AC
  Filled 2023-02-21: qty 5

## 2023-02-21 MED ORDER — ONDANSETRON HCL 4 MG/2ML IJ SOLN: 4.0000 mg | Freq: Four times a day (QID) | INTRAMUSCULAR | Status: DC | PRN

## 2023-02-21 MED ORDER — IODIXANOL 320 MG/ML IV SOLN
INTRAVENOUS | Status: DC | PRN
  Administered 2023-02-21: 25 mL via INTRA_ARTERIAL

## 2023-02-21 MED ORDER — VANCOMYCIN HCL 1500 MG/300ML IV SOLN
1500.0000 mg | Freq: Once | INTRAVENOUS | Status: AC
  Administered 2023-02-21: 1500 mg via INTRAVENOUS
  Filled 2023-02-21: qty 300

## 2023-02-21 MED ORDER — LIDOCAINE HCL (PF) 1 % IJ SOLN
INTRAMUSCULAR | Status: DC | PRN
  Administered 2023-02-21: 10 mL via INTRADERMAL

## 2023-02-21 MED ORDER — LABETALOL HCL 5 MG/ML IV SOLN
INTRAVENOUS | Status: AC
  Filled 2023-02-21: qty 4

## 2023-02-21 MED ORDER — VANCOMYCIN HCL IN DEXTROSE 1-5 GM/200ML-% IV SOLN: 1000.0000 mg | INTRAVENOUS | Status: DC

## 2023-02-21 MED ORDER — SODIUM CHLORIDE 0.9 % IV SOLN: INTRAVENOUS | Status: DC

## 2023-02-21 MED ORDER — MIDAZOLAM HCL 2 MG/ML PO SYRP: 8.0000 mg | ORAL_SOLUTION | Freq: Once | ORAL | Status: DC | PRN

## 2023-02-21 MED ORDER — HEPARIN (PORCINE) 25000 UT/250ML-% IV SOLN
1000.0000 [IU]/h | INTRAVENOUS | Status: DC
  Administered 2023-02-21: 1000 [IU]/h via INTRAVENOUS
  Filled 2023-02-21: qty 250

## 2023-02-21 MED ORDER — HEPARIN (PORCINE) IN NACL 1000-0.9 UT/500ML-% IV SOLN
INTRAVENOUS | Status: DC | PRN
  Administered 2023-02-21: 1000 mL

## 2023-02-21 MED ORDER — LABETALOL HCL 5 MG/ML IV SOLN
INTRAVENOUS | Status: DC | PRN
  Administered 2023-02-21: 10 mg via INTRAVENOUS

## 2023-02-21 MED ORDER — FENTANYL CITRATE (PF) 100 MCG/2ML IJ SOLN
INTRAMUSCULAR | Status: DC | PRN
  Administered 2023-02-21 (×2): 25 ug via INTRAVENOUS

## 2023-02-21 MED ORDER — HEPARIN SODIUM (PORCINE) 1000 UNIT/ML IJ SOLN
INTRAMUSCULAR | Status: AC
  Filled 2023-02-21: qty 10

## 2023-02-21 MED ORDER — HEPARIN BOLUS VIA INFUSION
3500.0000 [IU] | Freq: Once | INTRAVENOUS | Status: AC
  Administered 2023-02-21: 3500 [IU] via INTRAVENOUS
  Filled 2023-02-21: qty 3500

## 2023-02-21 MED ORDER — DIPHENHYDRAMINE HCL 50 MG/ML IJ SOLN: 50.0000 mg | Freq: Once | INTRAMUSCULAR | Status: DC | PRN

## 2023-02-21 MED ORDER — FENTANYL CITRATE PF 50 MCG/ML IJ SOSY: 12.5000 ug | PREFILLED_SYRINGE | Freq: Once | INTRAMUSCULAR | Status: DC | PRN

## 2023-02-21 MED ORDER — SODIUM CHLORIDE 0.9 % IV SOLN
2.0000 g | INTRAVENOUS | Status: DC
  Administered 2023-02-21: 2 g via INTRAVENOUS
  Filled 2023-02-21 (×2): qty 12.5

## 2023-02-21 MED ORDER — INSULIN ASPART 100 UNIT/ML IJ SOLN: 0.0000 [IU] | Freq: Three times a day (TID) | INTRAMUSCULAR | Status: DC

## 2023-02-21 MED ORDER — HEPARIN SODIUM (PORCINE) 1000 UNIT/ML IJ SOLN
INTRAMUSCULAR | Status: DC | PRN
  Administered 2023-02-21: 5000 [IU] via INTRAVENOUS

## 2023-02-21 MED ORDER — HYDROMORPHONE HCL 1 MG/ML IJ SOLN: 1.0000 mg | Freq: Once | INTRAMUSCULAR | Status: DC | PRN

## 2023-02-21 MED ORDER — LACTATED RINGERS IV SOLN: INTRAVENOUS | Status: DC

## 2023-02-21 MED ORDER — FENTANYL CITRATE (PF) 100 MCG/2ML IJ SOLN
INTRAMUSCULAR | Status: AC
  Filled 2023-02-21: qty 2

## 2023-02-21 SURGICAL SUPPLY — 18 items
CATH ANGIO 5F PIGTAIL 65CM (CATHETERS) IMPLANT
CATH BEACON 5 .038 100 VERT TP (CATHETERS) IMPLANT
COVER PROBE ULTRASOUND 5X96 (MISCELLANEOUS) IMPLANT
DEVICE STARCLOSE SE CLOSURE (Vascular Products) IMPLANT
GLIDEWIRE ADV .035X260CM (WIRE) IMPLANT
GLIDEWIRE ANGLED SS 035X260CM (WIRE) IMPLANT
GOWN STRL REUS W/ TWL LRG LVL3 (GOWN DISPOSABLE) ×1 IMPLANT
GOWN STRL REUS W/TWL LRG LVL3 (GOWN DISPOSABLE) ×1
KIT ENCORE 26 ADVANTAGE (KITS) IMPLANT
NDL ENTRY 21GA 7CM ECHOTIP (NEEDLE) IMPLANT
NEEDLE ENTRY 21GA 7CM ECHOTIP (NEEDLE) ×1 IMPLANT
PACK ANGIOGRAPHY (CUSTOM PROCEDURE TRAY) ×1 IMPLANT
SET INTRO CAPELLA COAXIAL (SET/KITS/TRAYS/PACK) IMPLANT
SHEATH ANL2 6FRX45 HC (SHEATH) IMPLANT
SHEATH BRITE TIP 5FRX11 (SHEATH) IMPLANT
SYR MEDRAD MARK 7 150ML (SYRINGE) IMPLANT
TUBING CONTRAST HIGH PRESS 72 (TUBING) IMPLANT
WIRE GUIDERIGHT .035X150 (WIRE) IMPLANT

## 2023-02-21 NOTE — Assessment & Plan Note (Signed)
Podiatry thinks this may be osteonecrosis secondary to gangrene of the toe rather than osteomyelitis and just wants to watch things at this time.  Prescribed Augmentin upon going home

## 2023-02-21 NOTE — Plan of Care (Signed)
  Problem: Education: Goal: Ability to describe self-care measures that may prevent or decrease complications (Diabetes Survival Skills Education) will improve Outcome: Progressing   Problem: Coping: Goal: Ability to adjust to condition or change in health will improve Outcome: Progressing   Problem: Fluid Volume: Goal: Ability to maintain a balanced intake and output will improve Outcome: Progressing   Problem: Health Behavior/Discharge Planning: Goal: Ability to identify and utilize available resources and services will improve Outcome: Progressing   Problem: Metabolic: Goal: Ability to maintain appropriate glucose levels will improve Outcome: Progressing   Problem: Nutritional: Goal: Maintenance of adequate nutrition will improve Outcome: Progressing   Problem: Skin Integrity: Goal: Risk for impaired skin integrity will decrease Outcome: Progressing   Problem: Elimination: Goal: Will not experience complications related to bowel motility Outcome: Progressing   Problem: Pain Managment: Goal: General experience of comfort will improve Outcome: Progressing

## 2023-02-21 NOTE — Progress Notes (Signed)
PODIATRY PROGRESS NOTE  NAME [name] MRN [id number] DOB 02-14-1946 DOA 02/20/2023   CC: Gangrene LT great toe Chief Complaint  Patient presents with   Wound Infection   S/p attempted revascularization today with Dr. Gilda Crease, VVS.  Patient resting comfortably in bed this evening with daughter present.  Presenting to discuss management of the left great toe gangrene.  Past Medical History:  Diagnosis Date   Allergy    Arthritis    Broken ankle    Cataract    Diabetes mellitus without complication (HCC)    History of colonoscopy    Hypertension    Macular degeneration syndrome    with edema---being treated.    Stage 4 chronic kidney disease (HCC)    Stroke (HCC) 2019       Latest Ref Rng & Units 02/21/2023    4:03 AM 02/20/2023   10:03 AM 01/15/2023   10:04 AM  CBC  WBC 4.0 - 10.5 K/uL 10.4  11.4  9.8   Hemoglobin 12.0 - 15.0 g/dL 41.6  60.6  30.1   Hematocrit 36.0 - 46.0 % 33.8  37.0  37.3   Platelets 150 - 400 K/uL 347  374  307        Latest Ref Rng & Units 02/21/2023    4:03 AM 02/20/2023   10:03 AM 12/31/2022   10:11 AM  BMP  Glucose 70 - 99 mg/dL 601  093  235   BUN 8 - 23 mg/dL 41  39  43   Creatinine 0.44 - 1.00 mg/dL 5.73  2.20  2.54   BUN/Creat Ratio 12 - 28   21   Sodium 135 - 145 mmol/L 141  141  141   Potassium 3.5 - 5.1 mmol/L 4.0  3.8  4.7   Chloride 98 - 111 mmol/L 113  111  108   CO2 22 - 32 mmol/L 19  23  20    Calcium 8.9 - 10.3 mg/dL 8.5  8.6  8.9     LT great toe 02/21/2023   Physical Exam: General: The patient is alert and oriented x3 in no acute distress.   Dermatology: Dry stable gangrene noted encompassing the majority of the great toe past the IPJ.  No drainage.  No appreciable erythema or malodor.  Clinically there is no concern for acute underlying cellulitis or infection despite findings of MRI.  Please see above noted photo  Vascular: s/p abdominal aortogram with attempted revascularization.  DOS: 02/21/2023 Findings:               Aortogram: The abdominal aorta is opacified with a bolus injection contrast.  Single renal arteries are noted bilaterally with faint nephrograms.  There is bilateral renal artery stenosis on the right it is in the distal renal artery and on the left it is an ostial lesion.  Both lesions are greater than 90%.  There are no hemodynamically significant stenoses identified within the aorta.  The aortic bifurcation is mildly diseased but widely patent.  Bilateral common internal and external iliac arteries are free of hemodynamically significant lesions.             Left lower Extremity: The left common femoral and profunda femoris demonstrate diffuse disease but there are no hemodynamically significant stenoses.  The profunda femoris collateralizes very poorly.  The superficial femoral artery demonstrates numerous greater than 80% stenoses and then at Hunter's canal occludes.  There is nonvisualization of the entire length of the popliteal.  There is  nonvisualization of the trifurcation.  There is nonvisualization of all 3 tibial arteries.  Essentially all I was able to identify below the knee are some collateral vessels. There is 0 vessel runoff to the foot.   SUMMARY: Based on these images no intervention is performed at this time.  Furthermore, there does not appear to be a viable distal target.  Given the concern for her renal function further attempts at identifying a artery would only entail increasing utilization of contrast and greater likelihood of Neth nephropathy and kidney injury resulting in dialysis.  Given these findings and the patient's comorbidities in association with the emesis and potential for complications secondary to this no further treatments are undertaken.  Neurological: Diminished via light touch  Musculoskeletal Exam: Nonambulatory.  No prior amputations  MR FOOT LEFT WO CONTRAST 02/20/2023 IMPRESSION: 1. 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 Deficient overlying soft tissues along the tip of the great toe. 2. Equivocal edema along the tip of the tuft of the distal phalanx second toe but not considered definitive for osteomyelitis in this digit. 3. Diffuse muscular atrophy in the visualized forefoot. 4. Subcutaneous edema especially dorsally along the forefoot, with mild tracking into the toes. No abscess observed.  ASSESSMENT/PLAN OF CARE Dry stable ischemic gangrene distal aspect of the left great toe with possible underlying osteomyelitis vs. osteonecrosis  -Patient seen at bedside this evening with daughter present -After long discussion with both the patient and daughter we will pursue conservative treatment management for now. -Although MRI suggestive of acute osteomyelitis of the distal phalanx cannot rule out osteonecrosis.  Clinically the toe appears very stable without any clinical indication of acute cellulitis or purulence -Recommend Betadine painted to the great toe daily -Will plan to follow-up in office outpatient for monitoring of the toe wound -From a podiatry standpoint, okay for discharge without antibiotics since the toe appears very stable.  Although there is concern for aspiration pneumonia and the patient able to be discharged with oral antibiotics from that standpoint.  Will defer to hospitalist -Follow-up in office postdischarge   Please contact me directly via secure chat with any questions or concerns.     Felecia Shelling, DPM [facility]  Dr. Felecia Shelling, DPM    [address] [place], [place] [address]                Office [phone]  Fax [phone]

## 2023-02-21 NOTE — Op Note (Signed)
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  Abdominal aortogram  2.  Selective injection of the left lower extremity third order catheter placement  3.  Ultrasound-guided access to the right common femoral artery  4.  StarClose right femoral artery which failed manual pressure held    Anesthesia: Conscious sedation was administered by the interventional radiology RN under my direct supervision. IV Versed plus fentanyl were utilized. Continuous ECG, pulse oximetry and blood pressure was monitored throughout the entire procedure.  Conscious sedation was administered for a total of 38 minutes.  Sheath: 6 [place] Ansell sheath retrograde right common femoral  Contrast: 25 cc   Fluoroscopy Time: 4.6 minutes  Indications:  The patient presents to [facility] with atherosclerotic occlusive disease bilateral lower extremities with gangrene of the left forefoot.  Pedal pulses are nonpalpable bilaterally suggesting hemodynamically significant atherosclerotic occlusive disease.  The risks and benefits as well as alternative therapies for lower extremity revascularization are reviewed with the patient all questions are answered the patient agrees to proceed.  The patient is therefore undergoing angiography with the hope for intervention for limb salvage.   Procedure:  [name] Attkisson Ramseyis a 77 y.o. female who was identified and appropriate procedural time out was performed.  The patient was then placed supine on the table and prepped and draped in the usual sterile fashion.  Ultrasound was used to evaluate the right common femoral artery.  It was echolucent and pulsatile indicating it is patent .  An ultrasound  image was acquired for the permanent record.  A micropuncture needle was used to access the right common femoral artery under direct ultrasound guidance.  The microwire was then advanced under fluoroscopic guidance without difficulty followed by the micro-sheath.  A 0.035 J wire was advanced without resistance and a 5Fr sheath was placed.    Pigtail catheter was then advanced to the level of T12 and AP projection of the aorta was obtained. Stiff angled Glidewire and pigtail catheter was then used across the bifurcation and the catheter was positioned in the distal external iliac artery.  LAO of the left groin was then obtained. Wire was reintroduced and negotiated into the SFA and the catheter was advanced into the SFA. Distal runoff was then performed.  6000 units of heparin was then given.  6 [place] Ansell sheath was then advanced up and over the bifurcation position with its tip in the mid common femoral on the left.  Using a Kumpe catheter and a Glidewire I would negotiated down to the SFA occlusion.  After a brief attempt to cross the patient vomited and had a very large bowel movement.  At this point I terminated the case.  StarClose device was deployed but failed to capture and manual pressure was held.   Findings:   Aortogram: The abdominal aorta is opacified with a bolus injection contrast.  Single renal arteries are noted bilaterally with faint nephrograms.  There is bilateral renal artery stenosis on the right it is in the distal renal artery and on the left it is an ostial lesion.  Both lesions are greater than 90%.  There are no hemodynamically significant stenoses identified within the aorta.  The aortic bifurcation is mildly diseased but widely patent.  Bilateral common internal and external iliac arteries are  free of hemodynamically significant lesions.  Left lower Extremity: The left common femoral and profunda femoris demonstrate diffuse disease but there are no hemodynamically  significant stenoses.  The profunda femoris collateralizes very poorly.  The superficial femoral artery demonstrates numerous greater than 80% stenoses and then at Hunter's canal occludes.  There is nonvisualization of the entire length of the popliteal.  There is nonvisualization of the trifurcation.  There is nonvisualization of all 3 tibial arteries.  Essentially all I was able to identify below the knee are some collateral vessels. There is 0 vessel runoff to the foot.  SUMMARY: Based on these images no intervention is performed at this time.  Furthermore, there does not appear to be a viable distal target.  Given the concern for her renal function further attempts at identifying a artery would only entail increasing utilization of contrast and greater likelihood of Neth nephropathy and kidney injury resulting in dialysis.  Given these findings and the patient's comorbidities in association with the emesis and potential for complications secondary to this no further treatments are undertaken.    Disposition: Patient was taken to the recovery room in stable condition having tolerated the procedure well.  Levora Dredge 02/21/2023,3:45 PM

## 2023-02-21 NOTE — Consult Note (Signed)
ANTICOAGULATION CONSULT NOTE - Initial Consult  Pharmacy Consult for Heparin Indication:  limb ischemia  Allergies  Allergen Reactions   Cephalexin Diarrhea and Nausea And Vomiting   Clonidine Anaphylaxis, Swelling and Other (See Comments)    Tongue swelling   Norvasc [Amlodipine Besylate] Swelling    Edema and TONGUE swelling    Lactose Intolerance (Gi) Diarrhea   Latex Other (See Comments)    Skin blisters   Lisinopril Cough   Morphine And Codeine Itching and Other (See Comments)    Throat itched and became scratchy   Tape Itching and Other (See Comments)    Some tapes irritate the skin and others don't (redness/itchiness)   Amlodipine Anxiety, Nausea Only and Swelling   Codeine Itching and Rash   Crestor [Rosuvastatin] Rash    Red and flushed in her face    Patient Measurements: Height: 5\' 2"  (157.5 cm) Weight: 65.8 kg (145 lb) IBW/kg (Calculated) : 50.1 Heparin Dosing Weight: 65.8 kg  Vital Signs: Temp: 98.3 F (36.8 C) (08/16 0428) Temp Source: Oral (08/16 0428) BP: 175/64 (08/16 0428) Pulse Rate: 69 (08/16 0428)  Labs: Recent Labs    02/20/23 1003 02/20/23 1935 02/21/23 0403  HGB 11.7*  --  11.0*  HCT 37.0  --  33.8*  PLT 374  --  347  APTT  --  33  --   LABPROT  --  15.6*  --   INR  --  1.2  --   HEPARINUNFRC  --   --  <0.10*  CREATININE 1.88*  --  1.64*    Estimated Creatinine Clearance: 25.6 mL/min (A) (by C-G formula based on SCr of 1.64 mg/dL (H)).   Medical History: Past Medical History:  Diagnosis Date   Allergy    Arthritis    Broken ankle    Cataract    Diabetes mellitus without complication (HCC)    History of colonoscopy    Hypertension    Macular degeneration syndrome    with edema---being treated.    Stage 4 chronic kidney disease (HCC)    Stroke (HCC) 2019    Medications:  Medications Prior to Admission  Medication Sig Dispense Refill Last Dose   ACCU-CHEK GUIDE test strip TEST BLOOD SUGAR TWICE DAILY 200 strip 3     acetaminophen (TYLENOL) 325 MG tablet Take 2 tablets (650 mg total) by mouth every 4 (four) hours as needed for mild pain (or temp > 37.5 C (99.5 F)).      aspirin 81 MG chewable tablet Chew 1 tablet (81 mg total) by mouth at bedtime. 90 tablet 3    BLACK CURRANT SEED OIL PO Take 1 tablet by mouth daily.      collagenase (SANTYL) 250 UNIT/GM ointment Apply 1 Application topically daily. Apply nickel thick to wound bed and cover with saline moistened wet-to-dry gauze.  Wound measurement 2.2 cm x 1.6 cm 30 g 0    doxycycline (VIBRA-TABS) 100 MG tablet Take 1 tablet (100 mg total) by mouth 2 (two) times daily. 20 tablet 0    losartan (COZAAR) 25 MG tablet Take 1 tablet (25 mg total) by mouth daily. 90 tablet 3    melatonin 3 MG TABS tablet Take 1 tablet (3 mg total) by mouth at bedtime as needed. 30 tablet 0    nitroGLYCERIN (NITROGLYN) 2 % ointment Apply 1g small amount around base of big toe once daily 30 g 0    sennosides-docusate sodium (SENOKOT-S) 8.6-50 MG tablet Take 1 tablet by mouth as  needed for constipation.      Vitamin D, Ergocalciferol, (DRISDOL) 1.25 MG (50000 UNIT) CAPS capsule Take 1 capsule (50,000 Units total) by mouth every 7 (seven) days. 7 capsule 0    Scheduled:   insulin aspart  0-9 Units Subcutaneous Q4H   labetalol  10 mg Intravenous Once   losartan  25 mg Oral Daily   Infusions:   heparin     lactated ringers 75 mL/hr at 02/21/23 0159   vancomycin     PRN: acetaminophen, hydrALAZINE, ondansetron **OR** ondansetron (ZOFRAN) IV Anti-infectives (From admission, onward)    Start     Dose/Rate Route Frequency Ordered Stop   02/20/23 1400  ceFEPIme (MAXIPIME) 2 g in sodium chloride 0.9 % 100 mL IVPB        2 g 200 mL/hr over 30 Minutes Intravenous  Once 02/20/23 1355 02/20/23 1441   02/20/23 1400  metroNIDAZOLE (FLAGYL) IVPB 500 mg        500 mg 100 mL/hr over 60 Minutes Intravenous  Once 02/20/23 1355 02/20/23 1822   02/20/23 1400  vancomycin (VANCOCIN) IVPB 1000  mg/200 mL premix        1,000 mg 200 mL/hr over 60 Minutes Intravenous  Once 02/20/23 1355         Assessment: 77 y.o. 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 No DOAC PTA.   Goal of Therapy:  Heparin level 0.3-0.7 units/ml Monitor platelets by anticoagulation protocol: Yes   Plan:  8/16:  HL @ 0403 = < 0.1 - heparin gtt ordered to start around 8/15 @ 2113 but pt refused - will need to f/u plans for anticoag 8/16 AM   Talayah Picardi D, PharmD 02/21/2023,4:59 AM

## 2023-02-21 NOTE — Consult Note (Signed)
ANTICOAGULATION CONSULT NOTE - Initial Consult  Pharmacy Consult for Heparin Indication:  limb ischemia  Allergies  Allergen Reactions   Cephalexin Diarrhea and Nausea And Vomiting   Clonidine Anaphylaxis, Swelling and Other (See Comments)    Tongue swelling   Norvasc [Amlodipine Besylate] Swelling    Edema and TONGUE swelling    Lactose Intolerance (Gi) Diarrhea   Latex Other (See Comments)    Skin blisters   Lisinopril Cough   Morphine And Codeine Itching and Other (See Comments)    Throat itched and became scratchy   Tape Itching and Other (See Comments)    Some tapes irritate the skin and others don't (redness/itchiness)   Amlodipine Anxiety, Nausea Only and Swelling   Codeine Itching and Rash   Crestor [Rosuvastatin] Rash    Red and flushed in her face   Patient Measurements: Height: 5\' 2"  (157.5 cm) Weight: 65.8 kg (145 lb) IBW/kg (Calculated) : 50.1 Heparin Dosing Weight: 65.8 kg  Vital Signs: Temp: 97.8 F (36.6 C) (08/16 0816) Temp Source: Oral (08/16 0816) BP: 188/65 (08/16 0816) Pulse Rate: 71 (08/16 0816)  Labs: Recent Labs    02/20/23 1003 02/20/23 1935 02/21/23 0403  HGB 11.7*  --  11.0*  HCT 37.0  --  33.8*  PLT 374  --  347  APTT  --  33  --   LABPROT  --  15.6*  --   INR  --  1.2  --   HEPARINUNFRC  --   --  <0.10*  CREATININE 1.88*  --  1.64*   Estimated Creatinine Clearance: 25.6 mL/min (A) (by C-G formula based on SCr of 1.64 mg/dL (H)).  Medical History: Past Medical History:  Diagnosis Date   Allergy    Arthritis    Broken ankle    Cataract    Diabetes mellitus without complication (HCC)    History of colonoscopy    Hypertension    Macular degeneration syndrome    with edema---being treated.    Stage 4 chronic kidney disease (HCC)    Stroke (HCC) 2019   Medications:  Medications Prior to Admission  Medication Sig Dispense Refill Last Dose   ACCU-CHEK GUIDE test strip TEST BLOOD SUGAR TWICE DAILY 200 strip 3     acetaminophen (TYLENOL) 325 MG tablet Take 2 tablets (650 mg total) by mouth every 4 (four) hours as needed for mild pain (or temp > 37.5 C (99.5 F)).      aspirin 81 MG chewable tablet Chew 1 tablet (81 mg total) by mouth at bedtime. 90 tablet 3    BLACK CURRANT SEED OIL PO Take 1 tablet by mouth daily.      collagenase (SANTYL) 250 UNIT/GM ointment Apply 1 Application topically daily. Apply nickel thick to wound bed and cover with saline moistened wet-to-dry gauze.  Wound measurement 2.2 cm x 1.6 cm 30 g 0    doxycycline (VIBRA-TABS) 100 MG tablet Take 1 tablet (100 mg total) by mouth 2 (two) times daily. 20 tablet 0    losartan (COZAAR) 25 MG tablet Take 1 tablet (25 mg total) by mouth daily. 90 tablet 3    melatonin 3 MG TABS tablet Take 1 tablet (3 mg total) by mouth at bedtime as needed. 30 tablet 0    nitroGLYCERIN (NITROGLYN) 2 % ointment Apply 1g small amount around base of big toe once daily 30 g 0    sennosides-docusate sodium (SENOKOT-S) 8.6-50 MG tablet Take 1 tablet by mouth as needed for constipation.  Vitamin D, Ergocalciferol, (DRISDOL) 1.25 MG (50000 UNIT) CAPS capsule Take 1 capsule (50,000 Units total) by mouth every 7 (seven) days. 7 capsule 0    Scheduled:   heparin  3,500 Units Intravenous Once   insulin aspart  0-9 Units Subcutaneous Q4H   labetalol  10 mg Intravenous Once   losartan  25 mg Oral Daily   Infusions:   sodium chloride     ceFEPime (MAXIPIME) IV     heparin     lactated ringers 75 mL/hr at 02/21/23 0159   [START ON 02/23/2023] vancomycin     PRN: acetaminophen, diphenhydrAMINE, famotidine, fentaNYL (SUBLIMAZE) injection, hydrALAZINE, HYDROmorphone (DILAUDID) injection, methylPREDNISolone (SOLU-MEDROL) injection, midazolam, ondansetron **OR** ondansetron (ZOFRAN) IV Anti-infectives (From admission, onward)    Start     Dose/Rate Route Frequency Ordered Stop   02/23/23 1000  vancomycin (VANCOCIN) IVPB 1000 mg/200 mL premix        1,000 mg 200 mL/hr  over 60 Minutes Intravenous Every 48 hours 02/21/23 0741     02/21/23 1400  ceFEPIme (MAXIPIME) 2 g in sodium chloride 0.9 % 100 mL IVPB        2 g 200 mL/hr over 30 Minutes Intravenous Every 24 hours 02/21/23 0741     02/21/23 0830  vancomycin (VANCOREADY) IVPB 1500 mg/300 mL        1,500 mg 150 mL/hr over 120 Minutes Intravenous  Once 02/21/23 0731 02/21/23 1017   02/20/23 1400  ceFEPIme (MAXIPIME) 2 g in sodium chloride 0.9 % 100 mL IVPB        2 g 200 mL/hr over 30 Minutes Intravenous  Once 02/20/23 1355 02/20/23 1441   02/20/23 1400  metroNIDAZOLE (FLAGYL) IVPB 500 mg        500 mg 100 mL/hr over 60 Minutes Intravenous  Once 02/20/23 1355 02/20/23 1822   02/20/23 1400  vancomycin (VANCOCIN) IVPB 1000 mg/200 mL premix  Status:  Discontinued        1,000 mg 200 mL/hr over 60 Minutes Intravenous  Once 02/20/23 1355 02/21/23 0731      Assessment: 77 y.o. 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 No DOAC PTA.   Goal of Therapy:  Heparin level 0.3-0.7 units/ml Monitor platelets by anticoagulation protocol: Yes   Plan:  Give 3500 unit bolus followed by continuous infusion at 1000 units/hr Check anti-Xa level in 8 hours and daily while on heparin Continue to monitor H&H and platelets  [name], PharmD 02/21/2023,11:02 AM

## 2023-02-21 NOTE — Progress Notes (Signed)
Patient here for left lower extremity angiogram with possible intervention, patient alert and oriented at this time and verbally states to [place] that she is approving for this procedure to be done.

## 2023-02-21 NOTE — Plan of Care (Signed)
  Problem: Coping: Goal: Ability to adjust to condition or change in health will improve Outcome: Progressing   Problem: Metabolic: Goal: Ability to maintain appropriate glucose levels will improve Outcome: Progressing   Problem: Skin Integrity: Goal: Risk for impaired skin integrity will decrease Outcome: Progressing   Problem: Education: Goal: Knowledge of General Education information will improve Description: Including pain rating scale, medication(s)/side effects and non-pharmacologic comfort measures Outcome: Progressing   Problem: Nutrition: Goal: Adequate nutrition will be maintained Outcome: Progressing   Problem: Coping: Goal: Level of anxiety will decrease Outcome: Progressing   Problem: Pain Managment: Goal: General experience of comfort will improve Outcome: Progressing   Problem: Safety: Goal: Ability to remain free from injury will improve Outcome: Progressing

## 2023-02-21 NOTE — Progress Notes (Signed)
Patient to specials 18 today for LE per Dr Gilda Crease. Alert and oriented, noting left facial droop/along with left arm slightly contractured.

## 2023-02-21 NOTE — Assessment & Plan Note (Signed)
Patient on sliding scale insulin.  Diet controlled at home.

## 2023-02-21 NOTE — Progress Notes (Signed)
DEMETRICA, BARRET ([id number]) [id number] Page 1 of 10 Visit Report for 02/20/2023 Chief Complaint Document Details Patient Name: Date of Service: [name], [name] 02/20/2023 8:15 A M Medical Record Number: [id number] Patient Account Number: [id number] Date of Birth/Sex: Treating RN: [birth year] (77 y.o. Starleen Arms, Leah Primary Care Provider: Abbey Chatters Other Clinician: Referring Provider: Treating Provider/Extender: Allen Derry Self, Referral Weeks in Treatment: 0 Information Obtained from: Patient Chief Complaint Left great toe gangrene Electronic Signature(s) Signed: 02/20/2023 9:01:22 AM By: Allen Derry PA-C Entered By: Allen Derry on 02/20/2023 09:01:22 -------------------------------------------------------------------------------- HPI Details Patient Name: Date of Service: [name]. 02/20/2023 8:15 A M Medical Record Number: [id number] Patient Account Number: [id number] Date of Birth/Sex: Treating RN: May 05, 1946 (77 y.o. Starleen Arms, Leah Primary Care Provider: Abbey Chatters Other Clinician: Referring Provider: Treating Provider/Extender: Allen Derry Self, Referral Weeks in Treatment: 0 History of Present Illness Chronic/Inactive Conditions Condition 1: Patient has what appears to be severe arterial disease and I am sending her to the ER for further evaluation and treatment of the dry gangrene of her left great toe. HPI Description: The patient is here for followup. Denies fevers, has been compliant with wearing Darco shoes. She states that her blood sugars are still in the low 200s. [name] is a 45F with a h/o DM who presents with an open ulcer on the plantar aspect of her [name] great toe. She has had it for close to 8 months. She has also had several debridements as well as courses of oral antibiotics. Currently, she denies fevers and has no pain. She has never had ulcers like this in the past. Her blood sugars are normally in the  170s. Foot xray was negative for osteomyelitis. HgbA1c was 10.3. READMISSION; 06/05/16; [name] is a 77 year old type II diabetic by description not well-controlled. She does not have known PAD or claudication. She has apparently some degree of neuropathy or she's been told that in the past. She has a history of a wound on the plantar left foot foot for which she was seen here in 2015 although I was not involved in her care nor were any other current physicians working in our clinics. She was managed with standard dressings and a Darco forefoot offloading boot and she seems to have healed quite quickly. [name], [name] ([id number]) [id number] Page 2 of 10 Apparently the patient noticed a callus/wound development a month ago although her daughter who is present states it was longer than that. Indeed the patient seems to been followed for at least 3 clinic visits with her podiatrist Dr. Helane Gunther. On October 19 noted a necrotic ulcer on the plantar aspect of the left first metatarsal head. She was using Silvadene based dressings given Keflex on 10/9. I don't believe any cultures or x-rays were done. More importantly she doesn't seem to be adequately offloading this area in her footwear ABIs in this clinic were 0.88 bilaterally. 06/12/16; continued follow-up for a central wound over the left first metatarsal head. Once again today required an extensive debridement. She is using a Darco forefoot offloading boot. Her x-ray of the foot was negative for any bone changes. She has not yet had vascular studies. ABI in this clinic was 0.88 06/19/16; formal arterial studies for early January for her daughter. In general the major wound over the plantar left first metatarsal head looks better. Area on her lateral fifth metatarsal head looks like it's proceeding towards closure. She is wearing the Darco forefoot offloading sandal was some  difficulty 06/26/16; formal arterial  studies towboat for early January. The area on the plantar left first metatarsal head apparently measures 0.2 x 0.2 mm smaller. She has the same surface slough and some nonviable surrounding tissue which is debrided with a curet. This cleans up quite nicely. The area on her lateral fifth metatarsal head is still fully epithelialized 07/03/16; formal arterial studies booked for early January. The areas on the plantar left first metatarsal head smaller by about 0.1 mm. His is not making quite is much progress as I was like. She is using a Darco forefoot offload her 07/10/16; she had her arterial studies today by Dr. Gilda Crease. I do not have the formal report however patient's daughter states she has "small vessel disease" presumably this means that there is not a revascularizable element. 07/12/2016 -- the arterial studies were done in the lab of Dr. Kirke Corin, and he will be seeing the patient on 07/19/2016. the duplex examination showed diffuse plaque bilaterally with a 50-74% stenosis of the right SFA. Three-vessel runoff on the right two-vessel runoff on the left. The ABI on the right was 0.89 on the left was 0.95 and the TBI on the right was 0.52 and on the left was 0.36 07/17/16; her arterial studies are noted. She had a 50-74% stenosis of the right SFA three-vessel runoff on the right two-vessel runoff on the left. Her wound is on the left first metatarsal head. She had an appointment with Dr. Kirke Corin however that had to reschedule list. The patient is traveling over the weekend and therefore she requested not to have the total contact cast replaced. 08/12/2016 -- the patient has not been back to see [place] for the last 25 days and comes in with an abscess on the plantar aspect of her left foot and the area on the left lateral foot is also an open wound now. 08/20/16; the patient arrived last week with an abscess on the left first plantar head. This was fully excised by Dr. Meyer Russel. CNS done showed  Staphylococcus Lugdunensis. This was not specifically plated against Augmentin although I think it probably should've worked. Her foot is a lot better. As I understand things this is a coag-negative Staphylococcus species however unlike other coag-negative staph this should be treated. The patient appears to be a lot better 08/28/16; she is completed the dicloxacillin. Area on the left first plantar metatarsal head is completely resolved and epithelialized. The area on the lateral aspect of her left fifth metatarsal head however is still problematic 09/03/16; the patient's area on her left first plantar metatarsal head remains closed. However the lateral fifth metatarsal head wound continues to be problematic. Her daughter shows me pictures from Saturday on his cell phone that show intense erythema around the wound. She had completed antibiotics last week however that was for culture over the plantar first metatarsal head. 09/08/16; the area on her left first met head remains closed. The area over the left fifth metatarsal head continues to be problematic. Erythema surrounding this last week is better [Augmentin] however culture I did was negative x-ray also negative for osteomyelitis. We switched this Santyl last week ABI 0.88 Readmission: 02-20-2023 upon evaluation today patient presents today for an issue with her left great toe which actually began roughly 4 weeks ago. I suspect there was an injury that was unknown she does have neuropathy and that this injury led to drainage. Unfortunately though it appears that she has significant vascular compromise with dry gangrene of the distal portion  of her toe affecting the nailbed and nail as well. Subsequently I do believe that the patient is unfortunately unlikely to be able to salvage the end of her toe I think she may require a distal tip amputation at minimum. Nonetheless this does not even need to be considered until she gets good arterial flow and I  suspect based on our evaluation today that her arterial flow is not good at all. I believe that she likely needs to get to the ER ASAP for further evaluation and treatment of this as to be honest I think trying to go the route of an outpatient referral for arterial studies then have her see the vascular doctor and then subsequently proceeding with any surgery that was necessary would take much longer than what we really want to see. The patient voiced understanding as that her daughter on this. Patient does have a history of peripheral vascular disease which I think has worsened. She also has diabetes mellitus type 2 with peripheral neuropathy. This is diet controlled with a hemoglobin A1c of 6.9 most recent. Electronic Signature(s) Signed: 02/20/2023 9:15:09 AM By: Allen Derry PA-C Entered By: Allen Derry on 02/20/2023 09:15:09 -------------------------------------------------------------------------------- Physical Exam Details Patient Name: Date of Service: ARANYA, MORELES 02/20/2023 8:15 A M Medical Record Number: [id number] Patient Account Number: [id number] Date of Birth/Sex: Treating RN: 01-Jun-1946 (77 y.o. Starleen Arms, Leah Primary Care Provider: Abbey Chatters Other Clinician: Referring Provider: Treating Provider/Extender: Allen Derry Self, Referral Weeks in Treatment: 0 Constitutional patient is hypertensive.. pulse regular and within target range for patient.Marland Kitchen respirations regular, non-labored and within target range for patient.Marland Kitchen temperature within target range for patient.. Well-nourished and well-hydrated in no acute distress. [name], [name] ([id number]) [id number] Page 3 of 10 Eyes conjunctiva clear no eyelid edema noted. pupils equal round and reactive to light and accommodation. Ears, Nose, Mouth, and Throat no gross abnormality of ear auricles or external auditory canals. normal hearing noted during conversation. mucus membranes  moist. Respiratory normal breathing without difficulty. Cardiovascular Absent posterior tibial and dorsalis pedis pulses bilateral lower extremities. Musculoskeletal normal gait and posture. no significant deformity or arthritic changes, no loss or range of motion, no clubbing. Psychiatric this patient is able to make decisions and demonstrates good insight into disease process. Alert and Oriented x 3. pleasant and cooperative. Notes Upon evaluation patient appears to have dry gangrene of the distal portion of her left great toe affecting the nailbed as well. This is of concern at this point to me and the fact that I think she has poor arterial flow and this is gena likely lead to a more emergent situation. I think she is very likely to require immediate vascular evaluation and likely intervention subsequently she is probably can require a amputation of the distal portion of the toe at minimum I would imagine. Electronic Signature(s) Signed: 02/20/2023 9:16:25 AM By: Allen Derry PA-C Entered By: Allen Derry on 02/20/2023 09:16:25 -------------------------------------------------------------------------------- Physician Orders Details Patient Name: Date of Service: [name], [name] 02/20/2023 8:15 A M Medical Record Number: [id number] Patient Account Number: [id number] Date of Birth/Sex: Treating RN: [birth year] (77 y.o. Starleen Arms, Leah Primary Care Provider: Abbey Chatters Other Clinician: Referring Provider: Treating Provider/Extender: Allen Derry Self, Referral Weeks in Treatment: 0 Verbal / Phone Orders: No Diagnosis Coding ICD-10 Coding Code Description I73.89 Other specified peripheral vascular diseases L97.528 Non-pressure chronic ulcer of other part of left foot with other specified severity E11.621 Type 2 diabetes mellitus with foot ulcer E11.43 Type 2  diabetes mellitus with diabetic autonomic (poly)neuropathy Follow-up Appointments Other: - TBD; recommend going to ER  for evaluation given progression of tissue necrosis; apply dry dressing Wound Treatment Electronic Signature(s) Signed: 02/21/2023 1:41:07 PM By: Bonnell Public Signed: 02/21/2023 3:00:55 PM By: Allen Derry PA-C Entered By: Bonnell Public on 02/20/2023 09:12:45 Mcvicar, Brinda [name] ([id number]) [id number] Page 4 of 10 -------------------------------------------------------------------------------- Problem List Details Patient Name: Date of Service: KAMARIYAH, DELIRA 02/20/2023 8:15 A M Medical Record Number: [id number] Patient Account Number: [id number] Date of Birth/Sex: Treating RN: Sep 16, 1945 (77 y.o. Starleen Arms, Leah Primary Care Provider: Abbey Chatters Other Clinician: Referring Provider: Treating Provider/Extender: Allen Derry Self, Referral Weeks in Treatment: 0 Active Problems ICD-10 Encounter Code Description Active Date MDM Diagnosis I73.89 Other specified peripheral vascular diseases 02/20/2023 No Yes L97.528 Non-pressure chronic ulcer of other part of left foot with other specified 02/20/2023 No Yes severity E11.621 Type 2 diabetes mellitus with foot ulcer 02/20/2023 No Yes E11.43 Type 2 diabetes mellitus with diabetic autonomic (poly)neuropathy 02/20/2023 No Yes Inactive Problems Resolved Problems Electronic Signature(s) Signed: 02/20/2023 9:01:06 AM By: Allen Derry PA-C Entered By: Allen Derry on 02/20/2023 09:01:06 -------------------------------------------------------------------------------- Progress Note Details Patient Name: Date of Service: [name], [name] 02/20/2023 8:15 A M Medical Record Number: [id number] Patient Account Number: [id number] Date of Birth/Sex: Treating RN: Mar 09, 1946 (77 y.o. Starleen Arms, Leah Primary Care Provider: Abbey Chatters Other Clinician: Referring Provider: Treating Provider/Extender: Allen Derry Self, Referral Weeks in Treatment: 0 [name], [name] ([id number]) [id number]  Page 5 of 10 Subjective Chief Complaint Information obtained from Patient Left great toe gangrene History of Present Illness (HPI) Chronic/Inactive Condition: Patient has what appears to be severe arterial disease and I am sending her to the ER for further evaluation and treatment of the dry gangrene of her left great toe. The patient is here for followup. Denies fevers, has been compliant with wearing Darco shoes. She states that her blood sugars are still in the low 200s. Summerlynn is a 41F with a h/o DM who presents with an open ulcer on the plantar aspect of her [name] great toe. She has had it for close to 8 months. She has also had several debridements as well as courses of oral antibiotics. Currently, she denies fevers and has no pain. She has never had ulcers like this in the past. Her blood sugars are normally in the 170s. Foot xray was negative for osteomyelitis. HgbA1c was 10.3. READMISSION; 06/05/16; [name] is a 77 year old type II diabetic by description not well-controlled. She does not have known PAD or claudication. She has apparently some degree of neuropathy or she's been told that in the past. She has a history of a wound on the plantar left foot foot for which she was seen here in 2015 although I was not involved in her care nor were any other current physicians working in our clinics. She was managed with standard dressings and a Darco forefoot offloading boot and she seems to have healed quite quickly. Apparently the patient noticed a callus/wound development a month ago although her daughter who is present states it was longer than that. Indeed the patient seems to been followed for at least 3 clinic visits with her podiatrist Dr. Helane Gunther. On October 19 noted a necrotic ulcer on the plantar aspect of the left first metatarsal head. She was using Silvadene based dressings given Keflex on 10/9. I don't believe any cultures or x-rays were done. More importantly  she doesn't seem to be adequately offloading  this area in her footwear ABIs in this clinic were 0.88 bilaterally. 06/12/16; continued follow-up for a central wound over the left first metatarsal head. Once again today required an extensive debridement. She is using a Darco forefoot offloading boot. Her x-ray of the foot was negative for any bone changes. She has not yet had vascular studies. ABI in this clinic was 0.88 06/19/16; formal arterial studies for early January for her daughter. In general the major wound over the plantar left first metatarsal head looks better. Area on her lateral fifth metatarsal head looks like it's proceeding towards closure. She is wearing the Darco forefoot offloading sandal was some difficulty 06/26/16; formal arterial studies towboat for early January. The area on the plantar left first metatarsal head apparently measures 0.2 x 0.2 mm smaller. She has the same surface slough and some nonviable surrounding tissue which is debrided with a curet. This cleans up quite nicely. The area on her lateral fifth metatarsal head is still fully epithelialized 07/03/16; formal arterial studies booked for early January. The areas on the plantar left first metatarsal head smaller by about 0.1 mm. His is not making quite is much progress as I was like. She is using a Darco forefoot offload her 07/10/16; she had her arterial studies today by Dr. Gilda Crease. I do not have the formal report however patient's daughter states she has "small vessel disease" presumably this means that there is not a revascularizable element. 07/12/2016 -- the arterial studies were done in the lab of Dr. Kirke Corin, and he will be seeing the patient on 07/19/2016. the duplex examination showed diffuse plaque bilaterally with a 50-74% stenosis of the right SFA. Three-vessel runoff on the right two-vessel runoff on the left. The ABI on the right was 0.89 on the left was 0.95 and the TBI on the right was 0.52 and on the  left was 0.36 07/17/16; her arterial studies are noted. She had a 50-74% stenosis of the right SFA three-vessel runoff on the right two-vessel runoff on the left. Her wound is on the left first metatarsal head. She had an appointment with Dr. Kirke Corin however that had to reschedule list. The patient is traveling over the weekend and therefore she requested not to have the total contact cast replaced. 08/12/2016 -- the patient has not been back to see [place] for the last 25 days and comes in with an abscess on the plantar aspect of her left foot and the area on the left lateral foot is also an open wound now. 08/20/16; the patient arrived last week with an abscess on the left first plantar head. This was fully excised by Dr. Meyer Russel. CNS done showed Staphylococcus Lugdunensis. This was not specifically plated against Augmentin although I think it probably should've worked. Her foot is a lot better. As I understand things this is a coag-negative Staphylococcus species however unlike other coag-negative staph this should be treated. The patient appears to be a lot better 08/28/16; she is completed the dicloxacillin. Area on the left first plantar metatarsal head is completely resolved and epithelialized. The area on the lateral aspect of her left fifth metatarsal head however is still problematic 09/03/16; the patient's area on her left first plantar metatarsal head remains closed. However the lateral fifth metatarsal head wound continues to be problematic. Her daughter shows me pictures from Saturday on his cell phone that show intense erythema around the wound. She had completed antibiotics last week however that was for culture over the plantar first metatarsal head.  09/08/16; the area on her left first met head remains closed. The area over the left fifth metatarsal head continues to be problematic. Erythema surrounding this last week is better [Augmentin] however culture I did was negative x-ray also negative for  osteomyelitis. We switched this Santyl last week ABI 0.88 Readmission: 02-20-2023 upon evaluation today patient presents today for an issue with her left great toe which actually began roughly 4 weeks ago. I suspect there was an injury that was unknown she does have neuropathy and that this injury led to drainage. Unfortunately though it appears that she has significant vascular compromise with dry gangrene of the distal portion of her toe affecting the nailbed and nail as well. Subsequently I do believe that the patient is unfortunately unlikely to be able to salvage the end of her toe I think she may require a distal tip amputation at minimum. Nonetheless this does not even need to be considered until she gets good arterial flow and I suspect based on our evaluation today that her arterial flow is not good at all. I believe that she likely needs to get to the ER ASAP for further evaluation and treatment of this as to be honest I think trying to go the route of an outpatient referral for arterial studies then have her see the vascular doctor and then subsequently proceeding with any surgery that was necessary would take much longer than what we really want to see. The patient voiced understanding as that her daughter on this. Patient does have a history of peripheral vascular disease which I think has worsened. She also has diabetes mellitus type 2 with peripheral neuropathy. This is diet controlled with a hemoglobin A1c of 6.9 most recent. Patient History Information obtained from Patient. Allergies codeine, seasonal allergies, cephalexin (Reaction: NVD), clonidine (Reaction: anaphylaxis), amlodipine (Reaction: swelling), lactose, latex (Reaction: hives), lisinopril (Reaction: cough), morphine (Reaction: pruritis), Crestor (Reaction: edema), adhesive tape (Reaction: itching) Family History No family history of Cancer, Diabetes, Heart Disease, Hereditary Spherocytosis, Hypertension, Kidney Disease,  Lung Disease, Seizures, Stroke, Thyroid Problems, Tuberculosis. [name], [name] ([id number]) [id number] Page 6 of 10 Social History Never smoker, Marital Status - Single, Alcohol Use - Never, Drug Use - No History, Caffeine Use - Daily. Medical History Eyes Patient has history of Cataracts Denies history of Glaucoma Ear/Nose/Mouth/Throat Denies history of Chronic sinus problems/congestion, Middle ear problems Hematologic/Lymphatic Patient has history of Anemia Denies history of Hemophilia, Human Immunodeficiency Virus, Lymphedema, Sickle Cell Disease Respiratory Denies history of Aspiration, Asthma, Chronic Obstructive Pulmonary Disease (COPD), Pneumothorax, Sleep Apnea, Tuberculosis Cardiovascular Patient has history of Hypertension, Peripheral Arterial Disease Denies history of Angina, Arrhythmia, Congestive Heart Failure, Deep Vein Thrombosis, Hypotension, Myocardial Infarction, Peripheral Venous Disease, Phlebitis, Vasculitis Gastrointestinal Denies history of Cirrhosis , Colitis, Crohns, Hepatitis A, Hepatitis B, Hepatitis C Endocrine Patient has history of Type II Diabetes - diet controlled Denies history of Type I Diabetes Genitourinary Denies history of End Stage Renal Disease Immunological Denies history of Lupus Erythematosus, Raynauds, Scleroderma Integumentary (Skin) Denies history of History of Burn, History of pressure wounds Musculoskeletal Denies history of Gout, Rheumatoid Arthritis, Osteoarthritis, Osteomyelitis Neurologic Denies history of Dementia, Neuropathy, Quadriplegia, Paraplegia, Seizure Disorder Oncologic Denies history of Received Chemotherapy, Received Radiation Psychiatric Denies history of Anorexia/bulimia, Confinement Anxiety Patient is treated with Controlled Diet. Blood sugar is tested. Medical A Surgical History Notes nd Cardiovascular cva (multiple) hld Genitourinary ckd IV Musculoskeletal pelvic fracture  right ankle fracture non-ambulatory (able to stand/pivot) Neurologic cva Review of Systems (ROS) Eyes  Complains or has symptoms of Glasses / Contacts. Cardiovascular Complains or has symptoms of LE edema. Integumentary (Skin) Complains or has symptoms of Wounds. Psychiatric Complains or has symptoms of Anxiety. Objective Constitutional patient is hypertensive.. pulse regular and within target range for patient.Marland Kitchen respirations regular, non-labored and within target range for patient.Marland Kitchen temperature within target range for patient.. Well-nourished and well-hydrated in no acute distress. Vitals Time Taken: 8:29 AM, Height: 62 in, Source: Stated, Weight: 140 lbs, Source: Stated, BMI: 25.6, Temperature: 97.6 F, Pulse: 70 bpm, Respiratory Rate: 20 breaths/min, Blood Pressure: 179/71 mmHg. Eyes conjunctiva clear no eyelid edema noted. pupils equal round and reactive to light and accommodation. Ears, Nose, Mouth, and Throat no gross abnormality of ear auricles or external auditory canals. normal hearing noted during conversation. mucus membranes moist. Respiratory normal breathing without difficulty. Cardiovascular Absent posterior tibial and dorsalis pedis pulses bilateral lower extremities. ASA, DAIUTO ([id number]) [id number] Page 7 of 10 Musculoskeletal normal gait and posture. no significant deformity or arthritic changes, no loss or range of motion, no clubbing. Psychiatric this patient is able to make decisions and demonstrates good insight into disease process. Alert and Oriented x 3. pleasant and cooperative. General Notes: Upon evaluation patient appears to have dry gangrene of the distal portion of her left great toe affecting the nailbed as well. This is of concern at this point to me and the fact that I think she has poor arterial flow and this is gena likely lead to a more emergent situation. I think she is very likely to require immediate vascular  evaluation and likely intervention subsequently she is probably can require a amputation of the distal portion of the toe at minimum I would imagine. Integumentary (Hair, Skin) Wound #4 status is Open. Original cause of wound was Gradually Appeared. The date acquired was: 01/20/2023. The wound is located on the Left T Great. The oe wound measures 1.8cm length x 3cm width x 0cm depth; 4.241cm^2 area and 0.424cm^3 volume. There is Fat Layer (Subcutaneous Tissue) exposed. There is a none present amount of drainage noted. The wound margin is distinct with the outline attached to the wound base. There is no granulation within the wound bed. There is a large (67-100%) amount of necrotic tissue within the wound bed including Eschar. 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 Based on what I am seeing I do believe the patient would benefit from a ER evaluation today. I think that she needs to go to the ER for further evaluation and therapy and specifically I think that they are going to have to intervene with regard to arterial flow. 2. She is probably going to require as well a amputation of the distal portion of the great toe left side. I think that this is unfortunately beyond salvage based on what I am seeing although again it is a little hard to tell for sure until arterial is restored and we can see where things go following. 3. I am also can recommend the patient should leave and go to the ER today upon leaving the clinic currently I think the sooner she can get in and get this tested the better. We will see the patient back for  follow-up visit depending on hospital course and progression of this ulceration/necrotic region. Electronic Signature(s) Signed: 02/20/2023 9:17:47 AM By: Allen Derry PA-C Entered By: Allen Derry on 02/20/2023 09:17:47 -------------------------------------------------------------------------------- ROS/PFSH Details Patient Name: Date of Service: [name]. 02/20/2023 8:15 A M Medical Record Number: [id number] Patient Account Number: [id number] Date of Birth/Sex: Treating RN: 11-16-1945 (77 y.o. Starleen Arms, Leah Primary Care Provider: Abbey Chatters Other Clinician: Referring Provider: Treating Provider/Extender: Allen Derry Self, Referral Weeks in Treatment: 0 Information Obtained From DOUA, [name] ([id number]) [id number] Page 8 of 10 Patient Eyes Complaints and Symptoms: Positive for: Glasses / Contacts Medical History: Positive for: Cataracts Negative for: Glaucoma Cardiovascular Complaints and Symptoms: Positive for: LE edema Medical History: Positive for: Hypertension; Peripheral Arterial Disease Negative for: Angina; Arrhythmia; Congestive Heart Failure; Deep Vein Thrombosis; Hypotension; Myocardial Infarction; Peripheral Venous Disease; Phlebitis; Vasculitis Past Medical History Notes: cva (multiple) hld Integumentary (Skin) Complaints and Symptoms: Positive for: Wounds Medical History: Negative for: History of Burn; History of pressure wounds Psychiatric Complaints and Symptoms: Positive for: Anxiety Medical History: Negative for: Anorexia/bulimia; Confinement Anxiety Ear/Nose/Mouth/Throat Medical History: Negative for: Chronic sinus problems/congestion; Middle ear problems Hematologic/Lymphatic Medical History: Positive for: Anemia Negative for: Hemophilia; Human Immunodeficiency Virus; Lymphedema; Sickle Cell Disease Respiratory Medical History: Negative for: Aspiration; Asthma; Chronic Obstructive Pulmonary Disease  (COPD); Pneumothorax; Sleep Apnea; Tuberculosis Gastrointestinal Medical History: Negative for: Cirrhosis ; Colitis; Crohns; Hepatitis A; Hepatitis B; Hepatitis C Endocrine Medical History: Positive for: Type II Diabetes - diet controlled Negative for: Type I Diabetes Time with diabetes: 10 years Treated with: Diet Blood sugar tested every day: Yes Tested : Genitourinary Medical History: Negative for: End Stage Renal Disease Past Medical History Notes: ckd IV Immunological Medical History: Negative for: Lupus Erythematosus; Raynauds; Scleroderma [name], [name] ([id number]) [id number] Page 9 of 10 Musculoskeletal Medical History: Negative for: Gout; Rheumatoid Arthritis; Osteoarthritis; Osteomyelitis Past Medical History Notes: pelvic fracture right ankle fracture non-ambulatory (able to stand/pivot) Neurologic Medical History: Negative for: Dementia; Neuropathy; Quadriplegia; Paraplegia; Seizure Disorder Past Medical History Notes: cva Oncologic Medical History: Negative for: Received Chemotherapy; Received Radiation HBO Extended History Items Eyes: Cataracts Immunizations Pneumococcal Vaccine: Received Pneumococcal Vaccination: Yes Received Pneumococcal Vaccination On or After 60th Birthday: Yes Implantable Devices No devices added Family and Social History Cancer: No; Diabetes: No; Heart Disease: No; Hereditary Spherocytosis: No; Hypertension: No; Kidney Disease: No; Lung Disease: No; Seizures: No; Stroke: No; Thyroid Problems: No; Tuberculosis: No; Never smoker; Marital Status - Single; Alcohol Use: Never; Drug Use: No History; Caffeine Use: Daily; Financial Concerns: No; Food, Clothing or Shelter Needs: No; Support System Lacking: No; Transportation Concerns: No Electronic Signature(s) Signed: 02/21/2023 1:41:07 PM By: Bonnell Public Signed: 02/21/2023 3:00:55 PM By: Allen Derry PA-C Entered By: Bonnell Public on 02/20/2023  08:48:07 -------------------------------------------------------------------------------- SuperBill Details Patient Name: Date of Service: [name] 02/20/2023 Medical Record Number: [id number] Patient Account Number: [id number] Date of Birth/Sex: Treating RN: [birth year] (77 y.o. Starleen Arms, Leah Primary Care Provider: Abbey Chatters Other Clinician: Referring Provider: Treating Provider/Extender: Allen Derry Self, Referral Weeks in Treatment: 0 Diagnosis Coding ICD-10 Codes Code Description I73.89 Other specified peripheral vascular diseases L97.528 Non-pressure chronic ulcer of other part of left foot with other specified severity E11.621 Type 2 diabetes mellitus with foot ulcer E11.43 Type 2 diabetes mellitus with diabetic autonomic (poly)neuropathy Kinnick, [name] ([id number]) [id number] Page 10 of 10 Facility Procedures : CPT4 Code: [id number] Description: 99214 - WOUND CARE VISIT-LEV 4 EST PT Modifier: Quantity: 1 Physician Procedures : CPT4 Code  Description Modifier [id number] - WC PHYS LEVEL 5 - NEW PT ICD-10 Diagnosis Description I73.89 Other specified peripheral vascular diseases L97.528 Non-pressure chronic ulcer of other part of left foot with other specified severity E11.621  Type 2 diabetes mellitus with foot ulcer E11.43 Type 2 diabetes mellitus with diabetic autonomic (poly)neuropathy Quantity: 1 Electronic Signature(s) Signed: 02/21/2023 11:10:34 AM By: Elliot Gurney, BSN, RN, CWS, Kim RN, BSN Signed: 02/21/2023 3:00:55 PM By: Allen Derry PA-C Previous Signature: 02/20/2023 9:18:09 AM Version By: Allen Derry PA-C Entered By: Elliot Gurney, BSN, RN, CWS, Kim on 02/21/2023 11:10:33

## 2023-02-21 NOTE — Consult Note (Signed)
.  Pharmacy Antibiotic Note  [name] is a 77 y.o. female admitted on 02/20/2023 with left toe gangrene. PMH is significant for recurrent CVA, type 2 diabetes, hypertension, hyperlipidemia, stage IV CKD presenting with left great toe gangrene. Pharmacy has been consulted for vancomycin and cefepime dosing for a wound infection. Patient noted to have been followed chronically for left great toe infection with wound care and podiatry. Noted worsening darkening of the toe over multiple weeks. Was seen at the [facility] wound care center 8/15. Presented to ER at [facility] following that appointment.   Plan: Vancomycin 1500mg  IV x 1 as loading dose, followed by 1000 mg IV Q48H. Goal AUC 400-550. Expected AUC: 453.8 Expected Cmin: 10.5 SCr used: 1.64, IBW, Vd 0.72 Cefepime 2 gm IV Q24H  Height: 5\' 2"  (157.5 cm) Weight: 65.8 kg (145 lb) IBW/kg (Calculated) : 50.1  Temp (24hrs), Avg:97.9 F (36.6 C), Min:97.5 F (36.4 C), Max:98.3 F (36.8 C)  Recent Labs  Lab 02/20/23 1003 02/21/23 0403  WBC 11.4* 10.4  CREATININE 1.88* 1.64*  LATICACIDVEN 1.1  --     Estimated Creatinine Clearance: 25.6 mL/min (A) (by C-G formula based on SCr of 1.64 mg/dL (H)).    Allergies  Allergen Reactions   Cephalexin Diarrhea and Nausea And Vomiting   Clonidine Anaphylaxis, Swelling and Other (See Comments)    Tongue swelling   Norvasc [Amlodipine Besylate] Swelling    Edema and TONGUE swelling    Lactose Intolerance (Gi) Diarrhea   Latex Other (See Comments)    Skin blisters   Lisinopril Cough   Morphine And Codeine Itching and Other (See Comments)    Throat itched and became scratchy   Tape Itching and Other (See Comments)    Some tapes irritate the skin and others don't (redness/itchiness)   Amlodipine Anxiety, Nausea Only and Swelling   Codeine Itching and Rash   Crestor [Rosuvastatin] Rash    Red and flushed in her face   Antimicrobials this admission: 8/15 Metronidazole x 1 8/15 Cefepime  >> 8/15 Vancomycin >>  Dose adjustments this admission:  Microbiology results: 8/15 BCx: NG < 24H  Thank you for allowing pharmacy to be a part of this patient's care.  [name], PharmD PGY1 Pharmacy Resident 02/21/2023 7:43 AM

## 2023-02-21 NOTE — Progress Notes (Signed)
ALEXIYA, JOVIC ([id number]) [id number] Page 1 of 10 Visit Report for 02/20/2023 Allergy List Details Patient Name: Date of Service: [name], [name] 02/20/2023 8:15 A M Medical Record Number: [id number] Patient Account Number: [id number] Date of Birth/Sex: Treating RN: 09/14/1945 (77 y.o. Starleen Arms, Leah Primary Care Taylia Berber: Abbey Chatters Other Clinician: Referring Andriy Sherk: Treating Marquan Vokes/Extender: Allen Derry Self, Referral Weeks in Treatment: 0 Allergies Active Allergies codeine seasonal allergies Type: Allergen cephalexin Reaction: NVD clonidine Reaction: anaphylaxis amlodipine Reaction: swelling lactose latex Reaction: hives lisinopril Reaction: cough morphine Reaction: pruritis Crestor Reaction: edema adhesive tape Reaction: itching Allergy Notes Electronic Signature(s) Signed: 02/21/2023 1:41:07 PM By: Bonnell Public Entered By: Bonnell Public on 02/20/2023 09:03:51 Minnis, Hilda Lias ([id number]) [id number] Page 2 of 10 -------------------------------------------------------------------------------- Arrival Information Details Patient Name: Date of Service: [name], [name] 02/20/2023 8:15 A M Medical Record Number: [id number] Patient Account Number: [id number] Date of Birth/Sex: Treating RN: 1945-10-20 (77 y.o. Starleen Arms, Leah Primary Care Dinara Lupu: Abbey Chatters Other Clinician: Referring Roxie Gueye: Treating Navin Dogan/Extender: Allen Derry Self, Referral Weeks in Treatment: 0 Visit Information Patient Arrived: Wheel Chair Arrival Time: 08:21 Accompanied By: daughter Transfer Assistance: Manual Patient Identification Verified: Yes Secondary Verification Process Completed: Yes Patient Requires Transmission-Based Precautions: No Patient Has Alerts: No History Since Last Visit Electronic Signature(s) Signed: 02/21/2023 1:41:07 PM By: Bonnell Public Entered By: Bonnell Public on 02/20/2023  08:22:17 -------------------------------------------------------------------------------- Clinic Level of Care Assessment Details Patient Name: Date of Service: [name], [name] 02/20/2023 8:15 A M Medical Record Number: [id number] Patient Account Number: [id number] Date of Birth/Sex: Treating RN: 01-Jan-1946 (77 y.o. Starleen Arms, Leah Primary Care Finn Amos: Abbey Chatters Other Clinician: Referring Lieutenant Abarca: Treating Gerhard Rappaport/Extender: Allen Derry Self, Referral Weeks in Treatment: 0 Clinic Level of Care Assessment Items TOOL 2 Quantity Score []  - 0 Use when only an EandM is performed on the INITIAL visit ASSESSMENTS - Nursing Assessment / Reassessment X- 1 20 General Physical Exam (combine w/ comprehensive assessment (listed just below) when performed on new pt. evals) X- 1 25 Comprehensive Assessment (HX, ROS, Risk Assessments, Wounds Hx, etc.) ASSESSMENTS - Wound and Skin A ssessment / Reassessment X - Simple Wound Assessment / Reassessment - one wound 1 5 []  - 0 Complex Wound Assessment / Reassessment - multiple wounds []  - 0 Dermatologic / Skin Assessment (not related to wound area) ASSESSMENTS - Ostomy and/or Continence Assessment and Care []  - 0 Incontinence Assessment and Management [name], [name] ([id number]) [id number] Page 3 of 10 []  - 0 Ostomy Care Assessment and Management (repouching, etc.) PROCESS - Coordination of Care X - Simple Patient / Family Education for ongoing care 1 15 []  - 0 Complex (extensive) Patient / Family Education for ongoing care X- 1 10 Staff obtains Chiropractor, Records, T Results / Process Orders est []  - 0 Staff telephones HHA, Nursing Homes / Clarify orders / etc []  - 0 Routine Transfer to another Facility (non-emergent condition) []  - 0 Routine Hospital Admission (non-emergent condition) X- 1 15 New Admissions / Manufacturing engineer / Ordering NPWT Apligraf, etc. , []  - 0 Emergency Hospital  Admission (emergent condition) X- 1 10 Simple Discharge Coordination []  - 0 Complex (extensive) Discharge Coordination PROCESS - Special Needs []  - 0 Pediatric / Minor Patient Management []  - 0 Isolation Patient Management []  - 0 Hearing / Language / Visual special needs []  - 0 Assessment of Community assistance (transportation, D/C planning, etc.) []  - 0 Additional assistance / Altered mentation []  - 0 Support Surface(s) Assessment (bed, cushion, seat, etc.)  INTERVENTIONS - Wound Cleansing / Measurement X- 1 5 Wound Imaging (photographs - any number of wounds) []  - 0 Wound Tracing (instead of photographs) X- 1 5 Simple Wound Measurement - one wound []  - 0 Complex Wound Measurement - multiple wounds X- 1 5 Simple Wound Cleansing - one wound []  - 0 Complex Wound Cleansing - multiple wounds INTERVENTIONS - Wound Dressings X - Small Wound Dressing one or multiple wounds 1 10 []  - 0 Medium Wound Dressing one or multiple wounds []  - 0 Large Wound Dressing one or multiple wounds []  - 0 Application of Medications - injection INTERVENTIONS - Miscellaneous []  - 0 External ear exam []  - 0 Specimen Collection (cultures, biopsies, blood, body fluids, etc.) []  - 0 Specimen(s) / Culture(s) sent or taken to Lab for analysis []  - 0 Patient Transfer (multiple staff / Nurse, adult / Similar devices) []  - 0 Simple Staple / Suture removal (25 or less) []  - 0 Complex Staple / Suture removal (26 or more) []  - 0 Hypo / Hyperglycemic Management (close monitor of Blood Glucose) []  - 0 Ankle / Brachial Index (ABI) - do not check if billed separately Has the patient been seen at the hospital within the last three years: Yes Total Score: 125 Level Of Care: New/Established - Level 4 Electronic Signature(s) Signed: 02/21/2023 1:41:07 PM By: Jerelyn Charles, Madicyn [name] ([id number]) [id number] Page 4 of 10 Entered By: Bonnell Public on 02/20/2023  09:19:03 -------------------------------------------------------------------------------- Encounter Discharge Information Details Patient Name: Date of Service: [name], [name] 02/20/2023 8:15 A M Medical Record Number: [id number] Patient Account Number: [id number] Date of Birth/Sex: Treating RN: 08-Jun-1946 (77 y.o. Starleen Arms, Leah Primary Care Gilmar Bua: Abbey Chatters Other Clinician: Referring Jamar Casagrande: Treating Gilmore List/Extender: Allen Derry Self, Referral Weeks in Treatment: 0 Encounter Discharge Information Items Discharge Condition: Stable Ambulatory Status: Wheelchair Discharge Destination: Emergency Room Orders Sent: Yes Transportation: Private Auto Accompanied By: daughter Schedule Follow-up Appointment: No Clinical Summary of Care: Electronic Signature(s) Signed: 02/21/2023 1:41:07 PM By: Bonnell Public Entered By: Bonnell Public on 02/20/2023 09:23:32 -------------------------------------------------------------------------------- Lower Extremity Assessment Details Patient Name: Date of Service: [name], [name] 02/20/2023 8:15 A M Medical Record Number: [id number] Patient Account Number: [id number] Date of Birth/Sex: Treating RN: Oct 24, 1945 (77 y.o. Starleen Arms, Leah Primary Care Rad Gramling: Abbey Chatters Other Clinician: Referring Michell Giuliano: Treating Martrice Apt/Extender: Allen Derry Self, Referral Weeks in Treatment: 0 Edema Assessment Assessed: [Left: No] [Right: No] [Left: Edema] [Right: :] Calf Left: Right: Point of Measurement: 31 cm From Medial Instep 31 cm Ankle Left: Right: Point of Measurement: 9 cm From Medial Instep 21.5 cm Habenicht, Malonie [name] ([id number]) [id number] Page 5 of 10 Vascular Assessment Pulses: Dorsalis Pedis Palpable: [Left:No] Doppler Audible: [Left:Inaudible] Popliteal Palpable: [Left:No Yes] Electronic Signature(s) Signed: 02/21/2023 1:41:07 PM By: Bonnell Public Entered By: Bonnell Public on 02/20/2023  08:41:40 -------------------------------------------------------------------------------- Multi Wound Chart Details Patient Name: Date of Service: [name] 02/20/2023 8:15 A M Medical Record Number: [id number] Patient Account Number: [id number] Date of Birth/Sex: Treating RN: 06/09/1946 (77 y.o. Starleen Arms, Leah Primary Care Odis Wickey: Abbey Chatters Other Clinician: Referring Kaylub Detienne: Treating Ted Goodner/Extender: Allen Derry Self, Referral Weeks in Treatment: 0 Vital Signs Height(in): 62 Pulse(bpm): 70 Weight(lbs): 140 Blood Pressure(mmHg): 179/71 Body Mass Index(BMI): 25.6 Temperature(F): 97.6 Respiratory Rate(breaths/min): 20 [4:Photos:] [N/A:N/A] Left T Great oe N/A N/A Wound Location: Gradually Appeared N/A N/A Wounding Event: Arterial Insufficiency Ulcer N/A N/A Primary Etiology: 01/20/2023 N/A N/A Date Acquired: 0 N/A N/A Weeks of Treatment: Open N/A N/A Wound Status:  No N/A N/A Wound Recurrence: Yes N/A N/A Pending A mputation on Presentation: 1.8x3x0 N/A N/A Measurements [name] x W x D (cm) [name], [name] ([id number]) [id number] Page 6 of 10 4.241 N/A N/A A (cm) : rea 0.424 N/A N/A Volume (cm) : Unclassifiable N/A N/A Classification: None Present N/A N/A Exudate A mount: Distinct, outline attached N/A N/A Wound Margin: None Present (0%) N/A N/A Granulation A mount: Large (67-100%) N/A N/A Necrotic A mount: Eschar N/A N/A Necrotic Tissue: Fat Layer (Subcutaneous Tissue): Yes N/A N/A Exposed Structures: Fascia: No Tendon: No Muscle: No Joint: No Bone: No None N/A N/A Epithelialization: Treatment Notes Wound #4 (Toe Great) Wound Laterality: Left Cleanser Peri-Wound Care Topical Primary Dressing Secondary Dressing Secured With Compression Wrap Compression Stockings Add-Ons Electronic Signature(s) Signed: 02/21/2023 11:09:44 AM By: Elliot Gurney, BSN, RN, CWS, Kim RN, BSN Entered By: Elliot Gurney, BSN, RN, CWS, Kim  on 02/21/2023 11:09:44 -------------------------------------------------------------------------------- Multi-Disciplinary Care Plan Details Patient Name: Date of Service: Reynolds Bowl, Hilda Lias. 02/20/2023 8:15 A M Medical Record Number: [id number] Patient Account Number: [id number] Date of Birth/Sex: Treating RN: 1946/04/19 (77 y.o. Starleen Arms, Leah Primary Care Hildreth Robart: Abbey Chatters Other Clinician: Referring Brooke Payes: Treating Aquanetta Schwarz/Extender: Allen Derry Self, Referral Weeks in Treatment: 0 Active Inactive Tissue Oxygenation Nursing Diagnoses: Actual ineffective tissue perfusion; peripheral (select once diagnosis is confirmed) Goals: Invasive arterial studies completed as ordered Date Initiated: 02/20/2023 Target Resolution Date: 02/27/2023 Goal Status: Active Non-invasive arterial studies are completed as ordered [name], [name] ([id number]) [id number] Page 7 of 10 Date Initiated: 02/20/2023 Target Resolution Date: 02/27/2023 Goal Status: Active Interventions: Assess patient understanding of disease process and management upon diagnosis and as needed Assess peripheral arterial status upon admission and as needed Provide education on tissue oxygenation and ischemia Notes: Electronic Signature(s) Signed: 02/21/2023 1:41:07 PM By: Bonnell Public Entered By: Bonnell Public on 02/20/2023 09:22:00 -------------------------------------------------------------------------------- Pain Assessment Details Patient Name: Date of Service: [name], [name] 02/20/2023 8:15 A M Medical Record Number: [id number] Patient Account Number: [id number] Date of Birth/Sex: Treating RN: Oct 25, 1945 (77 y.o. Starleen Arms, Leah Primary Care Nnaemeka Samson: Abbey Chatters Other Clinician: Referring Carinna Newhart: Treating Yoko Mcgahee/Extender: Allen Derry Self, Referral Weeks in Treatment: 0 Active Problems Location of Pain Severity and Description of Pain Patient Has Paino No Site  Locations Pain Management and Medication Current Pain Management: Electronic Signature(s) Signed: 02/21/2023 1:41:07 PM By: Bonnell Public Entered By: Bonnell Public on 02/20/2023 08:22:25 [name], [name] ([id number]) [id number] Page 8 of 10 -------------------------------------------------------------------------------- Patient/Caregiver Education Details Patient Name: Date of Service: [name], [name] 8/15/2024andnbsp8:15 A M Medical Record Number: [id number] Patient Account Number: [id number] Date of Birth/Gender: Treating RN: 08/02/1945 (78 y.o. Starleen Arms, Leah Primary Care Physician: Abbey Chatters Other Clinician: Referring Physician: Treating Physician/Extender: Allen Derry Self, Referral Weeks in Treatment: 0 Education Assessment Education Provided To: Patient and Caregiver daughter Education Topics Provided Tissue Oxygenation: Handouts: Peripheral Arterial Disease and Related Ulcers, Skin Perfusion Tests, Other: to ER for expedited evaluation Methods: Explain/Verbal Responses: State content correctly Wound/Skin Impairment: Handouts: Caring for Your Ulcer Methods: Explain/Verbal Responses: State content correctly Electronic Signature(s) Signed: 02/21/2023 1:41:07 PM By: Bonnell Public Entered By: Bonnell Public on 02/20/2023 09:22:48 -------------------------------------------------------------------------------- Wound Assessment Details Patient Name: Date of Service: YOHANNA, ROOME 02/20/2023 8:15 A M Medical Record Number: [id number] Patient Account Number: [id number] Date of Birth/Sex: Treating RN: [birth year] (77 y.o. Starleen Arms, Leah Primary Care Lexianna Weinrich: Abbey Chatters Other Clinician: Referring Donyae Kohn: Treating Jaimarie Rapozo/Extender: Allen Derry Self, Referral Weeks in Treatment: 0 Wound Status Wound Number: 4  Primary Etiology: Arterial Insufficiency Ulcer Wound Location: Left T Great oe Wound Status: Open Wounding Event:  Gradually Appeared Date Acquired: 01/20/2023 Weeks Of Treatment: 0 Clustered Wound: No Sparlin, Rayonna [name] ([id number]) [id number] Page 9 of 10 Photos Wound Measurements Length: (cm) 1.8 Width: (cm) 3 Depth: (cm) 0 Area: (cm) 4.241 Volume: (cm) 0.424 % Reduction in Area: % Reduction in Volume: Epithelialization: None Wound Description Classification: Unclassifiable Wound Margin: Distinct, outline attached Exudate Amount: None Present Foul Odor After Cleansing: No Slough/Fibrino No Wound Bed Granulation Amount: None Present (0%) Exposed Structure Necrotic Amount: Large (67-100%) Fascia Exposed: No Necrotic Quality: Eschar Fat Layer (Subcutaneous Tissue) Exposed: Yes Tendon Exposed: No Muscle Exposed: No Joint Exposed: No Bone Exposed: No Electronic Signature(s) Signed: 02/21/2023 1:41:07 PM By: Bonnell Public Entered By: Bonnell Public on 02/20/2023 08:34:33 -------------------------------------------------------------------------------- Vitals Details Patient Name: Date of Service: Shella Spearing [name]. 02/20/2023 8:15 A M Medical Record Number: [id number] Patient Account Number: [id number] Date of Birth/Sex: Treating RN: 1946-03-01 (77 y.o. Starleen Arms, Leah Primary Care Demeco Ducksworth: Abbey Chatters Other Clinician: Referring Stuart Mirabile: Treating Merinda Victorino/Extender: Allen Derry Self, Referral Weeks in Treatment: 0 Vital Signs Time Taken: 08:29 Temperature (F): 97.6 Height (in): 62 Pulse (bpm): 70 Source: Stated Respiratory Rate (breaths/min): 20 Weight (lbs): 140 Blood Pressure (mmHg): 179/71 Source: Stated Reference Range: 80 - 120 mg / dl Body Mass Index (BMI): 25.6 Electronic Signature(s) Signed: 02/21/2023 1:41:07 PM By: Jerelyn Charles, Signed: 02/21/2023 1:41:07 PM By: Benita Stabile ([id number]) [id number] Page 10 of 10 Entered By: Bonnell Public on 02/20/2023 08:31:09

## 2023-02-21 NOTE — Hospital Course (Signed)
77 y.o. 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  History primarily from patient's daughter in the setting of multiple CVAs.  Patient noted to have been followed chronically for left great toe infection with wound care and podiatry.  Noted worsening darkening of the toe over multiple weeks.  Was seen at the [facility] health wound care center today   8/16.  Spoke with the patient's daughter about starting heparin drip prior to angiogram today.  Patient with osteomyelitis and gangrene of the left first toe distal phalanx.

## 2023-02-21 NOTE — Progress Notes (Signed)
Progress Note   Patient: [name] VQQ:[id number] DOB: April 13, 1946 DOA: 02/20/2023     1 DOS: the patient was seen and examined on 02/21/2023   Brief hospital course: 77 y.o. 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  History primarily from patient's daughter in the setting of multiple CVAs.  Patient noted to have been followed chronically for left great toe infection with wound care and podiatry.  Noted worsening darkening of the toe over multiple weeks.  Was seen at the [facility] health wound care center today   8/16.  Spoke with the patient's daughter about starting heparin drip prior to angiogram today.  Patient with osteomyelitis and gangrene of the left first toe distal phalanx.  Assessment and Plan: * Toe gangrene [facility]) Patient for angiogram today and likely will need toe amputation.  Osteomyelitis of great toe of left foot (HCC) Angiogram today and likely will need toe amputation during the hospital course.  Continue antibiotics.  Stage 4 chronic kidney disease (HCC) Creatinine improved to 1.64 with IV fluids.  Watch closely after angiogram.  CVA (cerebral vascular accident) (HCC) Baseline history of multiple CVAs with noted difficulty with ambulation, predominant left-sided weakness and speech deficits.  HLD (hyperlipidemia) Statin allergy   Type 2 diabetes mellitus with stage 4 chronic kidney disease, without long-term current use of insulin (HCC) Patient on sliding scale insulin.  Diet controlled at home.  HTN (hypertension) On losartan.        Subjective: Patient feeling okay.  Sent in with gangrene of the first toe of the left foot.  MRI consistent with osteomyelitis.  For angiogram today.  Physical Exam: Vitals:   02/21/23 0428 02/21/23 0816 02/21/23 1324 02/21/23 1338  BP: (!) 175/64 (!) 188/65 (!) 179/69 (!) 176/70  Pulse: 69 71 88 89  Resp:  18 18 16  (!) 25  Temp: 98.3 F (36.8 C) 97.8 F (36.6 C) 97.9 F (36.6 C) 98.4 F (36.9 C)  TempSrc: Oral Oral  Oral  SpO2: 97% 97% 98% 96%  Weight:    65.8 kg  Height:    5\' 2"  (1.575 m)   Physical Exam HENT:     Head: Normocephalic.     Mouth/Throat:     Pharynx: No oropharyngeal exudate.  Eyes:     General: Lids are normal.     Conjunctiva/sclera: Conjunctivae normal.  Cardiovascular:     Rate and Rhythm: Normal rate and regular rhythm.     Heart sounds: Normal heart sounds, S1 normal and S2 normal.  Pulmonary:     Breath sounds: No decreased breath sounds, wheezing, rhonchi or rales.  Abdominal:     Palpations: Abdomen is soft.     Tenderness: There is no abdominal tenderness.  Musculoskeletal:     Right lower leg: No swelling.     Left lower leg: No swelling.  Skin:    General: Skin is warm.     Comments: Gangrene tip of left first toe.  Neurological:     Mental Status: She is alert and oriented to person, place, and time.     Data Reviewed: Creatinine 1.64, CO2 19, white blood cell count 10.4, hemoglobin 11, platelets 347  Family Communication: Spoke with patient's daughter at the bedside  Disposition: Status is: Inpatient Remains inpatient appropriate because: Patient for angiogram today and will likely need amputation.  Planned Discharge Destination: Home with Home Health    Time spent: 30  minutes Case discussed with vascular surgical team, nursing staff Author: Alford Highland, MD 02/21/2023 2:30 PM  For on call review [email or web address].

## 2023-02-21 NOTE — Progress Notes (Signed)
[name], [name] ([id number]) [phone] [id number] Page 1 of 5 Visit Report for 02/20/2023 Abuse Risk Screen Details Patient Name: Date of Service: [name], [name] 02/20/2023 8:15 A M Medical Record Number: [id number] Patient Account Number: [id number] Date of Birth/Sex: Treating RN: [birth year] (77 y.o. [name], [name] Primary Care [name]: [name] Other Clinician: Referring [name]: Treating [name]/Extender: [name], [name] Weeks in Treatment: 0 Abuse Risk Screen Items Answer ABUSE RISK SCREEN: Has anyone close to you tried to hurt or harm you recentlyo No Do you feel uncomfortable with anyone in your familyo No Has anyone forced you do things that you didnt want to doo No Electronic Signature(s) Signed: 02/21/2023 1:41:07 PM By: Bonnell Public Entered By: Bonnell Public on 02/20/2023 08:48:20 -------------------------------------------------------------------------------- Activities of Daily Living Details Patient Name: Date of Service: [name] 02/20/2023 8:15 A M Medical Record Number: [id number] Patient Account Number: [id number] Date of Birth/Sex: Treating RN: 05-17-1946 (77 y.o. [name], [name] Primary Care Ginger Leeth: [name] Other Clinician: Referring [name] Service: Treating [name]/Extender: [name], [name] Weeks in Treatment: 0 Activities of Daily Living Items Answer Activities of Daily Living (Please select one for each item) Drive Automobile Not Able T Medications ake Need Assistance Use T elephone Need Assistance Care for Appearance Need Assistance Use T oilet Need Assistance Bath / Shower Need Assistance Dress [name] Need Assistance Feed [name] Need Assistance Walk Not Able Get In / Out Bed Need Assistance Housework Not [name] ([id number]) [phone] [email or web address] Page 2 of 5 Prepare Meals Not Able Handle Money Need Assistance Shop for [name] Not  Able Electronic Signature(s) Signed: 02/21/2023 1:41:07 PM By: Bonnell Public Entered By: Bonnell Public on 02/20/2023 08:49:04 -------------------------------------------------------------------------------- Education Screening Details Patient Name: Date of Service: [name] 02/20/2023 8:15 A M Medical Record Number: [id number] Patient Account Number: [id number] Date of Birth/Sex: Treating RN: 09/05/1945 (77 y.o. [name], [name] Primary Care [name]: [name] Other Clinician: Referring [name]: Treating [name]/Extender: [name], [name] Weeks in Treatment: 0 Learning Preferences/Education Level/Primary Language Learning Preference: Explanation, Demonstration Highest Education Level: College or Above Preferred Language: English Cognitive Barrier Language Barrier: No Translator Needed: No Memory Deficit: No Emotional Barrier: No Cultural/Religious Beliefs Affecting Medical Care: No Physical Barrier Impaired Vision: Yes Glasses Impaired Hearing: No Decreased Hand dexterity: Yes Knowledge/Comprehension Knowledge Level: High Comprehension Level: High Ability to understand written instructions: High Ability to understand verbal instructions: High Motivation Anxiety Level: Calm Cooperation: Cooperative Education Importance: Acknowledges Need Interest in Health Problems: Asks Questions Perception: Coherent Willingness to Engage in [name] High Activities: Readiness to Engage in [name] High Activities: Electronic Signature(s) Signed: 02/21/2023 1:41:07 PM By: Bonnell Public Entered By: Bonnell Public on 02/20/2023 08:49:48 [name] ([id number]) [id number] Page 3 of 5 -------------------------------------------------------------------------------- Fall Risk Assessment Details Patient Name: Date of Service: [name] 02/20/2023 8:15 A M Medical Record Number: [id number] Patient  Account Number: [id number] Date of Birth/Sex: Treating RN: 09/01/1945 (77 y.o. [name], [name] Primary Care [name]: [name] Other Clinician: Referring [name]: Treating [name]/Extender: [name], [name] Weeks in Treatment: 0 Fall Risk Assessment Items Have you had 2 or more falls in the last 12 monthso 0 No Have you had any fall that resulted in injury in the last 12 monthso 0 No FALLS RISK SCREEN History of falling - immediate or within 3 months 0 No Secondary diagnosis (Do you have 2 or more medical diagnoseso) 0 No Ambulatory aid None/bed rest/wheelchair/nurse 0 Yes Crutches/cane/walker 0 No  Furniture 0 No Intravenous therapy Access/Saline/Heparin Lock 0 No Gait/Transferring Normal/ bed rest/ wheelchair 0 No Weak (short steps with or without shuffle, stooped but able to lift head while walking, may seek 0 No support from furniture) Impaired (short steps with shuffle, may have difficulty arising from chair, head down, impaired 20 Yes balance) Mental Status Oriented to own ability 0 Yes Electronic Signature(s) Signed: 02/21/2023 1:41:07 PM By: Bonnell Public Entered By: Bonnell Public on 02/20/2023 08:50:14 -------------------------------------------------------------------------------- Foot Assessment Details Patient Name: Date of Service: [name] 02/20/2023 8:15 A M Medical Record Number: [id number] Patient Account Number: [id number] Date of Birth/Sex: Treating RN: 10/13/1945 (77 y.o. [name], [name] Primary Care [name]: [name] Other Clinician: Referring [name]: Treating Zyon Grout/Extender: [name], [name] Weeks in Treatment: 0 Foot Assessment Items Site Locations [place], [place] [name] ([id number]) [phone] [id number] Page 4 of 5 + = Sensation present, - = Sensation absent, C = Callus, U = Ulcer R = Redness, W = Warmth, M = Maceration, PU = Pre-ulcerative lesion F = Fissure, S = Swelling, D =  Dryness Assessment Right: Left: Other Deformity: No Yes Prior Foot Ulcer: No Yes Prior Amputation: No No Charcot Joint: No Yes Ambulatory Status: Non-ambulatory Assistance Device: Stretcher GaitDoctor, hospital) Signed: 02/21/2023 1:41:07 PM By: Bonnell Public Entered By: Bonnell Public on 02/20/2023 08:54:34 -------------------------------------------------------------------------------- Nutrition Risk Screening Details Patient Name: Date of Service: SHARYLE, VANDERBERG 02/20/2023 8:15 A M Medical Record Number: [id number] Patient Account Number: [id number] Date of Birth/Sex: Treating RN: 1945/08/09 (77 y.o. [name], [name] Primary Care Travoris Bushey: [name] Other Clinician: Referring Mathan Darroch: Treating Naesha Buckalew/Extender: [name], [name] Weeks in Treatment: 0 Height (in): 62 Weight (lbs): 140 Body Mass Index (BMI): 25.6 Nutrition Risk Screening Items Score Screening NUTRITION RISK SCREEN: I have an illness or condition that made me change the kind and/or amount of food I eat 2 Yes I eat fewer than two meals per day 3 Yes I eat few fruits and vegetables, or milk products 0 No I have three or more drinks of beer, liquor or wine almost every day 0 No I have tooth or mouth problems that make it hard for me to eat 0 No [place], Doyle [name] ([id number]) [id number] Page 5 of 5 I don't always have enough money to buy the food I need 0 No I eat alone most of the time 0 No I take three or more different prescribed or over-the-counter drugs a day 0 No Without wanting to, I have lost or gained 10 pounds in the last six months 0 No I am not always physically able to shop, cook and/or feed myself 2 Yes Nutrition Protocols Good Risk Protocol Moderate Risk Protocol High Risk Proctocol 0 Provide education on nutrition Risk Level: High Risk Score: 7 Electronic Signature(s) Signed: 02/21/2023 1:41:07 PM By: Bonnell Public Entered  By: Bonnell Public on 02/20/2023 08:51:26

## 2023-02-22 ENCOUNTER — Inpatient Hospital Stay: Payer: Medicare PPO

## 2023-02-22 ENCOUNTER — Encounter: Payer: Self-pay | Admitting: Physical Medicine and Rehabilitation

## 2023-02-22 ENCOUNTER — Encounter: Payer: Self-pay | Admitting: Podiatry

## 2023-02-22 DIAGNOSIS — I739 Peripheral vascular disease, unspecified: Secondary | ICD-10-CM | POA: Diagnosis not present

## 2023-02-22 DIAGNOSIS — E785 Hyperlipidemia, unspecified: Secondary | ICD-10-CM | POA: Diagnosis not present

## 2023-02-22 DIAGNOSIS — E663 Overweight: Secondary | ICD-10-CM

## 2023-02-22 DIAGNOSIS — M869 Osteomyelitis, unspecified: Secondary | ICD-10-CM | POA: Diagnosis not present

## 2023-02-22 DIAGNOSIS — N184 Chronic kidney disease, stage 4 (severe): Secondary | ICD-10-CM | POA: Diagnosis not present

## 2023-02-22 LAB — BASIC METABOLIC PANEL
Anion gap: 6 (ref 5–15)
BUN: 38 mg/dL — ABNORMAL HIGH (ref 8–23)
CO2: 18 mmol/L — ABNORMAL LOW (ref 22–32)
Calcium: 8.3 mg/dL — ABNORMAL LOW (ref 8.9–10.3)
Chloride: 117 mmol/L — ABNORMAL HIGH (ref 98–111)
Creatinine, Ser: 1.87 mg/dL — ABNORMAL HIGH (ref 0.44–1.00)
GFR, Estimated: 27 mL/min — ABNORMAL LOW (ref >60)
Glucose, Bld: 89 mg/dL (ref 70–99)
Potassium: 3.9 mmol/L (ref 3.5–5.1)
Sodium: 141 mmol/L (ref 135–145)

## 2023-02-22 LAB — CBC
HCT: 31.6 % — ABNORMAL LOW (ref 36.0–46.0)
Hemoglobin: 10.2 g/dL — ABNORMAL LOW (ref 12.0–15.0)
MCH: 25.9 pg — ABNORMAL LOW (ref 26.0–34.0)
MCHC: 32.3 g/dL (ref 30.0–36.0)
MCV: 80.2 fL (ref 80.0–100.0)
Platelets: 318 10*3/uL (ref 150–400)
RBC: 3.94 MIL/uL (ref 3.87–5.11)
RDW: 15.2 % (ref 11.5–15.5)
WBC: 10.1 10*3/uL (ref 4.0–10.5)
nRBC: 0 % (ref 0.0–0.2)

## 2023-02-22 LAB — GLUCOSE, CAPILLARY
Glucose-Capillary: 89 mg/dL (ref 70–99)
Glucose-Capillary: 100 mg/dL — ABNORMAL HIGH (ref 70–99)
Glucose-Capillary: 142 mg/dL — ABNORMAL HIGH (ref 70–99)

## 2023-02-22 MED ORDER — SODIUM CHLORIDE 0.45 % IV SOLN
INTRAVENOUS | Status: DC
  Filled 2023-02-22: qty 75

## 2023-02-22 MED ORDER — AMOXICILLIN-POT CLAVULANATE 500-125 MG PO TABS: 1.0000 | ORAL_TABLET | Freq: Two times a day (BID) | ORAL | 0 refills | Status: AC

## 2023-02-22 MED ORDER — AMOXICILLIN-POT CLAVULANATE 500-125 MG PO TABS
1.0000 | ORAL_TABLET | Freq: Two times a day (BID) | ORAL | Status: DC
  Administered 2023-02-22: 1 via ORAL
  Filled 2023-02-22 (×2): qty 1

## 2023-02-22 MED ORDER — ASPIRIN 81 MG PO TBEC
81.0000 mg | DELAYED_RELEASE_TABLET | Freq: Every day | ORAL | Status: DC
  Filled 2023-02-22 (×2): qty 1

## 2023-02-22 NOTE — Assessment & Plan Note (Signed)
Patient has an allergy to statin.  Continue aspirin.  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

## 2023-02-22 NOTE — Progress Notes (Signed)
Pressure drsg removed from Right groin. Area dry and intact. Denies pain . IV fluid infusing in Right AC. Resting comfortably.

## 2023-02-22 NOTE — Plan of Care (Signed)
  Problem: Education: Goal: Ability to describe self-care measures that may prevent or decrease complications (Diabetes Survival Skills Education) will improve Outcome: Adequate for Discharge Goal: Individualized Educational Video(s) Outcome: Adequate for Discharge   Problem: Coping: Goal: Ability to adjust to condition or change in health will improve Outcome: Adequate for Discharge   Problem: Fluid Volume: Goal: Ability to maintain a balanced intake and output will improve Outcome: Adequate for Discharge   Problem: Health Behavior/Discharge Planning: Goal: Ability to identify and utilize available resources and services will improve Outcome: Adequate for Discharge Goal: Ability to manage health-related needs will improve Outcome: Adequate for Discharge   Problem: Metabolic: Goal: Ability to maintain appropriate glucose levels will improve Outcome: Adequate for Discharge   Problem: Nutritional: Goal: Maintenance of adequate nutrition will improve Outcome: Adequate for Discharge Goal: Progress toward achieving an optimal weight will improve Outcome: Adequate for Discharge   Problem: Skin Integrity: Goal: Risk for impaired skin integrity will decrease Outcome: Adequate for Discharge   Problem: Tissue Perfusion: Goal: Adequacy of tissue perfusion will improve Outcome: Adequate for Discharge   Problem: Education: Goal: Knowledge of General Education information will improve Description: Including pain rating scale, medication(s)/side effects and non-pharmacologic comfort measures Outcome: Adequate for Discharge   Problem: Health Behavior/Discharge Planning: Goal: Ability to manage health-related needs will improve Outcome: Adequate for Discharge   Problem: Clinical Measurements: Goal: Ability to maintain clinical measurements within normal limits will improve Outcome: Adequate for Discharge Goal: Will remain free from infection Outcome: Adequate for Discharge Goal:  Diagnostic test results will improve Outcome: Adequate for Discharge Goal: Respiratory complications will improve Outcome: Adequate for Discharge Goal: Cardiovascular complication will be avoided Outcome: Adequate for Discharge   Problem: Activity: Goal: Risk for activity intolerance will decrease Outcome: Adequate for Discharge   Problem: Nutrition: Goal: Adequate nutrition will be maintained Outcome: Adequate for Discharge   Problem: Coping: Goal: Level of anxiety will decrease Outcome: Adequate for Discharge   Problem: Elimination: Goal: Will not experience complications related to bowel motility Outcome: Adequate for Discharge Goal: Will not experience complications related to urinary retention Outcome: Adequate for Discharge   Problem: Pain Managment: Goal: General experience of comfort will improve Outcome: Adequate for Discharge   Problem: Safety: Goal: Ability to remain free from injury will improve Outcome: Adequate for Discharge   Problem: Skin Integrity: Goal: Risk for impaired skin integrity will decrease Outcome: Adequate for Discharge   Problem: Education: Goal: Knowledge of General Education information will improve Description: Including pain rating scale, medication(s)/side effects and non-pharmacologic comfort measures Outcome: Adequate for Discharge   Problem: Education: Goal: Understanding of CV disease, CV risk reduction, and recovery process will improve Outcome: Adequate for Discharge Goal: Individualized Educational Video(s) Outcome: Adequate for Discharge   Problem: Activity: Goal: Ability to return to baseline activity level will improve Outcome: Adequate for Discharge   Problem: Cardiovascular: Goal: Ability to achieve and maintain adequate cardiovascular perfusion will improve Outcome: Adequate for Discharge Goal: Vascular access site(s) Level 0-1 will be maintained Outcome: Adequate for Discharge   Problem: Health  Behavior/Discharge Planning: Goal: Ability to safely manage health-related needs after discharge will improve Outcome: Adequate for Discharge

## 2023-02-22 NOTE — Progress Notes (Signed)
PT Cancellation Note  Patient Details Name: [name] MRN: [id number] DOB: 1945/11/30   Cancelled Treatment:    Reason Eval/Treat Not Completed: PT screened, no needs identified, will sign off PT orders received, chart reviewed. Pt received in bed & daughter present in room, both reporting pt is anticipating d/c soon & no PT needs at this time. Pt's daughter reports comfort with assisting pt with stand pivot transfers & reports they have a hoyer lift at home if they need it. PT to complete current orders at this time. Please re-consult if new needs arise.  Aleda Grana, PT, DPT 02/22/23, 9:54 AM   Sandi Mariscal 02/22/2023, 9:53 AM

## 2023-02-22 NOTE — Discharge Summary (Signed)
Physician Discharge Summary   Patient: [name] MRN: [id number] DOB: October 29, 1945  Admit date:     02/20/2023  Discharge date: 02/22/23  Discharge Physician: Alford Highland   PCP: Sharon Seller, NP   Recommendations at discharge:   Follow-up PCP 5 days Follow-up podiatry 1 week  Discharge Diagnoses: Principal Problem:   Toe gangrene (HCC) Active Problems:   Osteomyelitis of great toe of left foot (HCC)   PVD (peripheral vascular disease) (HCC)   Stage 4 chronic kidney disease (HCC)   HTN (hypertension)   Overweight (BMI 25.0-29.9)   Type 2 diabetes mellitus with stage 4 chronic kidney disease, without long-term current use of insulin (HCC)   HLD (hyperlipidemia)   CVA (cerebral vascular accident) (HCC)   Diabetic ulcer of toe of left foot associated with type 2 diabetes mellitus [facility])    Hospital Course: 77 y.o. 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  History primarily from patient's daughter in the setting of multiple CVAs.  Patient noted to have been followed chronically for left great toe infection with wound care and podiatry.  Noted worsening darkening of the toe over multiple weeks.  Was seen at the [facility] health wound care center today   8/16.  Spoke with the patient's daughter about starting heparin drip prior to angiogram today.  Patient with osteomyelitis and gangrene of the left first toe distal phalanx.  Angiogram showing bilateral renal artery stenosis, nonvisualization of the entire length of the popliteal artery and tibial arteries without runoff to the foot.  Podiatry spoke with the patient and daughter and they just want to watch the gangrene at this point.  Patient may have aspirated during the angiogram and was on antibiotics. 8/17.  Patient and patient's daughter wanted to be discharged since no further procedures will be done.   Recommend checking a BMP and follow-up appointment.  Prescribed Augmentin upon discharge just in case aspiration pneumonia and also would cover the toe.  Chest x-ray read by radiologist after discharge read as chronic interstitial lung disease with no acute process.  Assessment and Plan: * Toe gangrene (HCC) Seen by podiatry and they would like to watch as outpatient.  Osteomyelitis of great toe of left foot (HCC) Podiatry thinks this may be osteonecrosis secondary to gangrene of the toe rather than osteomyelitis and just wants to watch things at this time.  Prescribed Augmentin upon going home  PVD (peripheral vascular disease) (HCC) Patient has an allergy to statin.  Continue aspirin.  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  Stage 4 chronic kidney disease (HCC) Creatinine 1.87 with a GFR of 27 upon discharge.  Recommend checking BMP and follow-up appointment.  CVA (cerebral vascular accident) (HCC) Baseline history of multiple CVAs with noted difficulty with ambulation, predominant left-sided weakness and speech deficits.  Declined physical therapy evaluation and home health.  On aspirin  HLD (hyperlipidemia) Statin allergy   Type 2 diabetes mellitus with stage 4 chronic kidney disease, without long-term current use of insulin (HCC) Diet controlled at home.  Overweight (BMI 25.0-29.9) BMI 26.53  HTN (hypertension) On losartan.         Consultants: Vascular surgery, podiatry Procedures performed: Angiogram Disposition: Home Diet recommendation:  Cardiac and Carb modified diet DISCHARGE MEDICATION: Allergies as of 02/22/2023       Reactions   Cephalexin Diarrhea, Nausea And Vomiting   Clonidine Anaphylaxis, Swelling, Other (See Comments)  Tongue swelling   Norvasc [amlodipine Besylate] Swelling   Edema and TONGUE swelling   Lactose Intolerance (gi) Diarrhea   Latex Other (See Comments)   Skin blisters   Lisinopril Cough   Morphine And  Codeine Itching, Other (See Comments)   Throat itched and became scratchy   Tape Itching, Other (See Comments)   Some tapes irritate the skin and others don't (redness/itchiness)   Amlodipine Anxiety, Nausea Only, Swelling   Codeine Itching, Rash   Crestor [rosuvastatin] Rash   Red and flushed in her face        Medication List     STOP taking these medications    BLACK CURRANT SEED OIL PO   doxycycline 100 MG tablet Commonly known as: VIBRA-TABS   nitroGLYCERIN 2 % ointment Commonly known as: NITROGLYN   sennosides-docusate sodium 8.6-50 MG tablet Commonly known as: SENOKOT-S       TAKE these medications    Accu-Chek Guide test strip Generic drug: glucose blood TEST BLOOD SUGAR TWICE DAILY   acetaminophen 325 MG tablet Commonly known as: TYLENOL Take 2 tablets (650 mg total) by mouth every 4 (four) hours as needed for mild pain (or temp > 37.5 C (99.5 F)).   amoxicillin-clavulanate 500-125 MG tablet Commonly known as: AUGMENTIN Take 1 tablet by mouth 2 (two) times daily for 8 days.   aspirin 81 MG chewable tablet Chew 1 tablet (81 mg total) by mouth at bedtime.   losartan 25 MG tablet Commonly known as: COZAAR Take 1 tablet (25 mg total) by mouth daily.   melatonin 3 MG Tabs tablet Take 1 tablet (3 mg total) by mouth at bedtime as needed.   Santyl 250 UNIT/GM ointment Generic drug: collagenase Apply 1 Application topically daily. Apply nickel thick to wound bed and cover with saline moistened wet-to-dry gauze.  Wound measurement 2.2 cm x 1.6 cm   Vitamin D (Ergocalciferol) 1.25 MG (50000 UNIT) Caps capsule Commonly known as: DRISDOL Take 1 capsule (50,000 Units total) by mouth every 7 (seven) days.        Follow-up Information     Sharon Seller, NP Follow up in 5 day(s).   Specialty: Geriatric Medicine Contact information: [address] [place] [address] [phone]         Felecia Shelling, DPM Follow up in 1 week(s).    Specialty: Podiatry Contact information: [address] [place] [address] [place] [address] [phone]                Discharge Exam: Ceasar Mons Weights   02/20/23 1000 02/21/23 1338  Weight: 65.8 kg 65.8 kg   Physical Exam HENT:     Head: Normocephalic.     Mouth/Throat:     Pharynx: No oropharyngeal exudate.  Eyes:     General: Lids are normal.     Conjunctiva/sclera: Conjunctivae normal.  Cardiovascular:     Rate and Rhythm: Normal rate and regular rhythm.     Heart sounds: Normal heart sounds, S1 normal and S2 normal.  Pulmonary:     Breath sounds: No decreased breath sounds, wheezing, rhonchi or rales.  Abdominal:     Palpations: Abdomen is soft.     Tenderness: There is no abdominal tenderness.  Musculoskeletal:     Right lower leg: No swelling.     Left lower leg: No swelling.  Skin:    General: Skin is warm.     Comments: Gangrene tip of left first toe.  Neurological:  Mental Status: She is alert and oriented to person, place, and time.      Condition at discharge: stable  The results of significant diagnostics from this hospitalization (including imaging, microbiology, ancillary and laboratory) are listed below for reference.   Imaging Studies: DG Chest Port 1 View  Result Date: 02/22/2023 CLINICAL DATA:  Aspiration of gastric contents into the nasopharynx EXAM: PORTABLE CHEST 1 VIEW COMPARISON:  08/24/2022 FINDINGS: Mild bilateral chronic interstitial thickening. No focal consolidation. No pleural effusion or pneumothorax. Stable cardiomediastinal silhouette. Mitral annular calcification. No acute osseous abnormality. IMPRESSION: 1. No acute cardiopulmonary disease. Chronic interstitial lung disease. Electronically Signed   By: Elige Ko M.D.   On: 02/22/2023 10:18   PERIPHERAL VASCULAR CATHETERIZATION  Result Date: 02/21/2023 See surgical note for result.  US ARTERIAL ABI (SCREENING LOWER EXTREMITY)  Result Date: 02/20/2023 CLINICAL DATA:   First toe ulcer on the left EXAM: NONINVASIVE PHYSIOLOGIC VASCULAR STUDY OF BILATERAL LOWER EXTREMITIES TECHNIQUE: Evaluation of both lower extremities were performed at rest, including calculation of ankle-brachial indices with single level Doppler, pressure and pulse volume recording. COMPARISON:  None Available. FINDINGS: Right ABI:  0.49 Left ABI:  0.27 Right Lower Extremity:  Monophasic waveforms are noted. Left Lower Extremity:  Dampened monophasic waveforms are noted. < 0.5 Severe PAD IMPRESSION: Significantly reduced ankle-brachial indices bilaterally worse on the left than the right. Electronically Signed   By: Alcide Clever M.D.   On: 02/20/2023 18:58   MR FOOT LEFT WO CONTRAST  Result Date: 02/20/2023 CLINICAL DATA:  Great toe infection over the last 4 weeks, dry gangrene. EXAM: MRI OF THE LEFT FOOT WITHOUT CONTRAST TECHNIQUE: Multiplanar, multisequence MR imaging of the left forefoot with attention to the toes was performed. No intravenous contrast was administered. COMPARISON:  09/03/2016 FINDINGS: Bones/Joint/Cartilage Partially resorbed tuft of the distal phalanx great toe with little if any overlying mantle of soft tissue coverage, and diffuse edema signal throughout the distal phalanx great toe, compatible with active osteomyelitis. Equivocal edema along the tip of the tuft of the distal phalanx second toe but not considered definitive for osteomyelitis. Ligaments Lisfranc ligament appears grossly intact. Muscles and Tendons Diffuse muscular atrophy in the visualized forefoot. Soft tissues Subcutaneous edema especially dorsally along the forefoot, with mild tracking into the toes. No abscess observed. IMPRESSION: 1. Osteomyelitis of the distal phalanx great toe with partial resorption of the tuft and diffuse edema throughout the distal phalanx. Deficient overlying soft tissues along the tip of the great toe. 2. Equivocal edema along the tip of the tuft of the distal phalanx second toe but not  considered definitive for osteomyelitis in this digit. 3. Diffuse muscular atrophy in the visualized forefoot. 4. Subcutaneous edema especially dorsally along the forefoot, with mild tracking into the toes. No abscess observed. Electronically Signed   By: Gaylyn Rong M.D.   On: 02/20/2023 17:38    Microbiology: Results for orders placed or performed during the hospital encounter of 02/20/23  Blood culture (routine x 2)     Status: None (Preliminary result)   Collection Time: 02/20/23 10:03 AM   Specimen: BLOOD  Result Value Ref Range Status   Specimen Description BLOOD RIGHT [facility]  Final   Special Requests   Final    BOTTLES DRAWN AEROBIC AND ANAEROBIC Blood Culture adequate volume   Culture   Final    NO GROWTH 2 DAYS Performed at [facility], [address], [place], [place] [address]    Report Status PENDING  Incomplete  Blood culture (routine x 2)     Status: None (Preliminary result)   Collection Time: 02/20/23 12:56 PM   Specimen: BLOOD  Result Value Ref Range Status   Specimen Description BLOOD RIGHT ANTECUBITAL  Final   Special Requests   Final    BOTTLES DRAWN AEROBIC AND ANAEROBIC Blood Culture adequate volume   Culture   Final    NO GROWTH 2 DAYS Performed at [facility], [address] [place], [place] [address]    Report Status PENDING  Incomplete    Labs: CBC: Recent Labs  Lab 02/20/23 1003 02/21/23 0403 02/22/23 0440  WBC 11.4* 10.4 10.1  NEUTROABS 8.0*  --   --   HGB 11.7* 11.0* 10.2*  HCT 37.0 33.8* 31.6*  MCV 81.5 78.8* 80.2  PLT 374 347 318   Basic Metabolic Panel: Recent Labs  Lab 02/20/23 1003 02/21/23 0403 02/22/23 0440  NA 141 141 141  K 3.8 4.0 3.9  CL 111 113* 117*  CO2 23 19* 18*  GLUCOSE 143* 105* 89  BUN 39* 41* 38*  CREATININE 1.88* 1.64* 1.87*  CALCIUM 8.6* 8.5* 8.3*   Liver Function Tests: Recent Labs  Lab 02/20/23 1003 02/21/23 0403  AST 13* 11*  ALT 17 14  ALKPHOS 129* 109  BILITOT 0.2* 0.7   PROT 6.9 6.1*  ALBUMIN 2.7* 2.8*   CBG: Recent Labs  Lab 02/21/23 2040 02/21/23 2356 02/22/23 0446 02/22/23 0757 02/22/23 1132  GLUCAP 206* 129* 89 100* 142*    Discharge time spent: greater than 30 minutes.  Signed: Alford Highland, MD [facility] 02/22/2023

## 2023-02-22 NOTE — Assessment & Plan Note (Signed)
BMI 26.53

## 2023-02-23 NOTE — Progress Notes (Signed)
  Subjective:  Patient ID: [name], female    DOB: 07/07/1946,  MRN: [id number]  Chief Complaint  Patient presents with   Wound Check    77 y.o. female presents with the above complaint. History confirmed with patient.  She returns for follow-up feels like it has worsened, they had been scheduled for the vascular testing but had to cancel it.  Objective:  Physical Exam: warm, good capillary refill and some decree sensation, +1 PT pulse, nonpalpable DP, good capillary fill time.  Full-thickness ulceration has now progressed to dry gangrenous changes at the distal tip of the hallux        Assessment:   1. Ulcer of great toe, left, with fat layer exposed (HCC)      Plan:  Patient was evaluated and treated and all questions answered.    Ulcer left hallux -Ulceration continuing to worsen.  Has developed into dry gangrene, suspect worsening vascular function.  Vascular testing was rescheduled again for this Thursday, she is an upcoming appointment as well with Dr. Chestine Spore.   Addendum note: Following the visit she was evaluated on 02/20/2023 at the wound care center and the gangrene continued to progress, she was admitted to [facility] for management of this.  She underwent angiography but has no runoff to the foot and multiple stenosis and complete occlusion of the SFA at the [place].  They are going to pursue local wound care, she has follow-up scheduled with my partner Dr. Logan Bores on 04/01/2023  Return in about 2 weeks (around 03/04/2023) for wound care.

## 2023-02-24 ENCOUNTER — Encounter: Payer: Self-pay | Admitting: Podiatry

## 2023-02-24 ENCOUNTER — Telehealth: Payer: Self-pay

## 2023-02-24 ENCOUNTER — Encounter: Payer: Self-pay | Admitting: Nurse Practitioner

## 2023-02-24 ENCOUNTER — Encounter: Payer: Self-pay | Admitting: Vascular Surgery

## 2023-02-24 ENCOUNTER — Other Ambulatory Visit: Payer: Self-pay | Admitting: Podiatry

## 2023-02-24 DIAGNOSIS — I69354 Hemiplegia and hemiparesis following cerebral infarction affecting left non-dominant side: Secondary | ICD-10-CM

## 2023-02-24 DIAGNOSIS — L97522 Non-pressure chronic ulcer of other part of left foot with fat layer exposed: Secondary | ICD-10-CM

## 2023-02-24 DIAGNOSIS — N184 Chronic kidney disease, stage 4 (severe): Secondary | ICD-10-CM

## 2023-02-24 DIAGNOSIS — I96 Gangrene, not elsewhere classified: Secondary | ICD-10-CM

## 2023-02-24 DIAGNOSIS — I739 Peripheral vascular disease, unspecified: Secondary | ICD-10-CM

## 2023-02-24 DIAGNOSIS — R5381 Other malaise: Secondary | ICD-10-CM

## 2023-02-24 NOTE — Transitions of Care (Post Inpatient/ED Visit) (Signed)
   02/24/2023  Name: AQUISHA WILBORNE MRN: [id number] DOB: [birth year]  Today's TOC FU Call Status: Today's TOC FU Call Status:: Unsuccessful Call (1st Attempt) Unsuccessful Call (1st Attempt) Date: 02/24/23  Attempted to reach the patient regarding the most recent Inpatient/ED visit.  Follow Up Plan: Additional outreach attempts will be made to reach the patient to complete the Transitions of Care (Post Inpatient/ED visit) call.     Antionette Fairy, RN,BSN,CCM [facility] Care Management Care Management Community Coordinator Direct Phone: [phone] Toll Free: [phone] Fax: [phone]

## 2023-02-25 ENCOUNTER — Telehealth: Payer: Self-pay

## 2023-02-25 ENCOUNTER — Encounter: Payer: Self-pay | Admitting: Vascular Surgery

## 2023-02-25 LAB — CULTURE, BLOOD (ROUTINE X 2)
Culture: NO GROWTH
Culture: NO GROWTH
Special Requests: ADEQUATE
Special Requests: ADEQUATE

## 2023-02-25 NOTE — Transitions of Care (Post Inpatient/ED Visit) (Signed)
02/25/2023  Name: [name] MRN: [id number] DOB: May 10, 1946  Today's TOC FU Call Status: Today's TOC FU Call Status:: Successful TOC FU Call Completed TOC FU Call Complete Date: 02/25/23 (Call completed with dauhgter Merleen Nicely.)  Transition Care Management Follow-up Telephone Call Date of Discharge: 02/22/23 Discharge Facility: [facility]) Type of Discharge: Inpatient Admission Primary Inpatient Discharge Diagnosis:: "dry gangrene" How have you been since you were released from the hospital?: Same (pt doing okay-eating well had small BM the other day-no pain. daughter shares that she is wanting palliaitve care or hospice servcies for pt-PCP has sent referral and she is awaiting to be contacted.) Any questions or concerns?: No  Items Reviewed: Did you receive and understand the discharge instructions provided?: Yes Medications obtained,verified, and reconciled?: Yes (Medications Reviewed) Any new allergies since your discharge?: No Dietary orders reviewed?: Yes Type of Diet Ordered:: low salt/heart healthy/carb modified Do you have support at home?: Yes People in Home: child(ren), adult Name of Support/Comfort Primary Source: [name]  Medications Reviewed Today: Medications Reviewed Today     Reviewed by Charlyn Minerva, RN (Registered Nurse) on 02/25/23 at [phone]  Med List Status: <None>   Medication Order Taking? Sig Documenting Provider Last Dose Status Informant  ACCU-CHEK GUIDE test strip [id number]  TEST BLOOD SUGAR TWICE DAILY Sharon Seller, NP  Active Pharmacy Records, Child  acetaminophen (TYLENOL) 325 MG tablet [id number] Yes Take 2 tablets (650 mg total) by mouth every 4 (four) hours as needed for mild pain (or temp > 37.5 C (99.5 F)). Charlton Amor, PA-C Taking Active Pharmacy Records, Child  amoxicillin-clavulanate (AUGMENTIN) 500-125 MG tablet [id number] Yes Take 1 tablet by mouth 2 (two) times daily for 8 days. Alford Highland, MD Taking Active   aspirin 81 MG chewable tablet [id number] Yes Chew 1 tablet (81 mg total) by mouth at bedtime. Horton Chin, MD Taking Active Pharmacy Records, Child  collagenase [facility]) 250 UNIT/GM ointment [id number] Yes Apply 1 Application topically daily. Apply nickel thick to wound bed and cover with saline moistened wet-to-dry gauze.  Wound measurement 2.2 cm x 1.6 cm Edwin Cap, DPM Taking Active Pharmacy Records, Child  losartan (COZAAR) 25 MG tablet [id number] Yes Take 1 tablet (25 mg total) by mouth daily. Horton Chin, MD Taking Active Pharmacy Records, Child  melatonin 3 MG TABS tablet [id number] Yes Take 1 tablet (3 mg total) by mouth at bedtime as needed. Charlton Amor, PA-C Taking Active Pharmacy Records, Child  Vitamin D, Ergocalciferol, (DRISDOL) 1.25 MG (50000 UNIT) CAPS capsule [id number] Yes Take 1 capsule (50,000 Units total) by mouth every 7 (seven) days. Horton Chin, MD Taking Active Pharmacy Records, Child           Med Note [facility]   Fri Feb 21, 2023  1:42 PM) Leanora Ivanoff on Wednesdays            Home Care and Equipment/Supplies: Were Home Health Services Ordered?: NA Any new equipment or medical supplies ordered?: NA  Functional Questionnaire: Do you need assistance with bathing/showering or dressing?: Yes Do you need assistance with meal preparation?: Yes Do you need assistance with eating?: No Do you have difficulty maintaining continence: No Do you need assistance with getting out of bed/getting out of a chair/moving?: No Do you have difficulty managing or taking your medications?: Yes  Follow up appointments reviewed: PCP Follow-up appointment confirmed?: Yes Date of PCP follow-up appointment?: 04/13/23 Follow-up Provider: Wyatt Mage Specialist [facility] Follow-up  appointment confirmed?: Yes Date of Specialist follow-up appointment?: 04/01/23 Follow-Up Specialty Provider:: Dr. Logan Bores Do you need  transportation to your follow-up appointment?: No Do you understand care options if your condition(s) worsen?: Yes-patient verbalized understanding  SDOH Interventions Today    Flowsheet Row Most Recent Value  SDOH Interventions   Food Insecurity Interventions Intervention Not Indicated  Transportation Interventions Intervention Not Indicated      TOC Interventions Today    Flowsheet Row Most Recent Value  TOC Interventions   TOC Interventions Discussed/Reviewed TOC Interventions Discussed, S/S of infection      Interventions Today    Flowsheet Row Most Recent Value  General Interventions   General Interventions Discussed/Reviewed General Interventions Discussed, Doctor Visits  Doctor Visits Discussed/Reviewed Doctor Visits Discussed, PCP, Specialist  PCP/Specialist Visits Compliance with follow-up visit  Education Interventions   Education Provided Provided Education  Nutrition Interventions   Nutrition Discussed/Reviewed Nutrition Discussed, Fluid intake, Increasing proteins, Decreasing fats, Decreasing salt, Adding fruits and vegetables, Decreasing sugar intake  Pharmacy Interventions   Pharmacy Dicussed/Reviewed Pharmacy Topics Discussed, Medications and their functions  Safety Interventions   Safety Discussed/Reviewed Safety Discussed, Home Safety  Advanced Directive Interventions   Advanced Directives Discussed/Reviewed Advanced Directives Discussed, End of Life  End of Life Palliative, Hospice       Quinnley Colasurdo Magda Paganini [facility] Care Management Care Management Community Coordinator Direct Phone: [phone] Toll Free: [phone] Fax: [phone]

## 2023-02-25 NOTE — Telephone Encounter (Signed)
Please call the daughter and clarify if she wants hospice or palliative? She sent mychart msg yesterday for palliative consult and I placed that on 8/19

## 2023-02-25 NOTE — Telephone Encounter (Signed)
Eber Jones from [facility] called and states that she needs hospice order to be placed for patient in Epic. She states that once this is completed. I can call her back and let her know. Message routed to PCP Janyth Contes, Janene Harvey, NP

## 2023-02-25 NOTE — Telephone Encounter (Signed)
Called and no answer.  Voicemail left.

## 2023-03-03 DIAGNOSIS — E119 Type 2 diabetes mellitus without complications: Secondary | ICD-10-CM | POA: Diagnosis not present

## 2023-03-03 DIAGNOSIS — I639 Cerebral infarction, unspecified: Secondary | ICD-10-CM | POA: Diagnosis not present

## 2023-03-03 DIAGNOSIS — G8194 Hemiplegia, unspecified affecting left nondominant side: Secondary | ICD-10-CM | POA: Diagnosis not present

## 2023-03-03 DIAGNOSIS — R1310 Dysphagia, unspecified: Secondary | ICD-10-CM | POA: Diagnosis not present

## 2023-03-04 ENCOUNTER — Telehealth: Payer: Self-pay

## 2023-03-04 ENCOUNTER — Ambulatory Visit: Payer: Medicare PPO | Admitting: Podiatry

## 2023-03-04 NOTE — Telephone Encounter (Signed)
[name] with [facility] walked in office and filled out walk inpatient form requesting verbal orders for hospice admission. I spoke with Abbey Chatters, NP and she approved.and verbal orders were given.

## 2023-03-05 DIAGNOSIS — R293 Abnormal posture: Secondary | ICD-10-CM | POA: Diagnosis not present

## 2023-03-05 DIAGNOSIS — M5459 Other low back pain: Secondary | ICD-10-CM | POA: Diagnosis not present

## 2023-03-05 DIAGNOSIS — M9903 Segmental and somatic dysfunction of lumbar region: Secondary | ICD-10-CM | POA: Diagnosis not present

## 2023-03-05 DIAGNOSIS — M9902 Segmental and somatic dysfunction of thoracic region: Secondary | ICD-10-CM | POA: Diagnosis not present

## 2023-03-05 DIAGNOSIS — M9901 Segmental and somatic dysfunction of cervical region: Secondary | ICD-10-CM | POA: Diagnosis not present

## 2023-03-11 ENCOUNTER — Encounter: Payer: Medicare PPO | Admitting: Vascular Surgery

## 2023-03-14 ENCOUNTER — Ambulatory Visit: Payer: Medicare PPO | Admitting: Nurse Practitioner

## 2023-03-28 ENCOUNTER — Ambulatory Visit: Payer: Medicare PPO | Admitting: Nurse Practitioner

## 2023-04-01 ENCOUNTER — Ambulatory Visit: Payer: Medicare PPO | Admitting: Podiatry

## 2023-06-08 DEATH — deceased

## 2023-07-14 ENCOUNTER — Ambulatory Visit: Payer: Medicare PPO | Admitting: Physical Medicine and Rehabilitation

## 2023-09-25 ENCOUNTER — Encounter: Payer: Medicare PPO | Admitting: Nurse Practitioner
